# Patient Record
Sex: Male | Born: 1935 | Race: White | Hispanic: No | Smoking: Never smoker
Health system: Southern US, Community
[De-identification: ages and names within clinical notes are randomized; demographics above are authoritative.]

## PROBLEM LIST (undated history)

## (undated) DIAGNOSIS — G629 Polyneuropathy, unspecified: Secondary | ICD-10-CM

## (undated) DIAGNOSIS — F1011 Alcohol abuse, in remission: Secondary | ICD-10-CM

## (undated) DIAGNOSIS — Z923 Personal history of irradiation: Secondary | ICD-10-CM

## (undated) DIAGNOSIS — IMO0002 Reserved for concepts with insufficient information to code with codable children: Secondary | ICD-10-CM

## (undated) DIAGNOSIS — M899 Disorder of bone, unspecified: Secondary | ICD-10-CM

## (undated) DIAGNOSIS — C9000 Multiple myeloma not having achieved remission: Secondary | ICD-10-CM

## (undated) DIAGNOSIS — IMO0001 Reserved for inherently not codable concepts without codable children: Secondary | ICD-10-CM

## (undated) DIAGNOSIS — K219 Gastro-esophageal reflux disease without esophagitis: Secondary | ICD-10-CM

## (undated) DIAGNOSIS — K5720 Diverticulitis of large intestine with perforation and abscess without bleeding: Secondary | ICD-10-CM

## (undated) DIAGNOSIS — K651 Peritoneal abscess: Secondary | ICD-10-CM

## (undated) DIAGNOSIS — S62102A Fracture of unspecified carpal bone, left wrist, initial encounter for closed fracture: Secondary | ICD-10-CM

## (undated) DIAGNOSIS — Z9221 Personal history of antineoplastic chemotherapy: Secondary | ICD-10-CM

## (undated) DIAGNOSIS — N400 Enlarged prostate without lower urinary tract symptoms: Secondary | ICD-10-CM

## (undated) DIAGNOSIS — K5792 Diverticulitis of intestine, part unspecified, without perforation or abscess without bleeding: Secondary | ICD-10-CM

## (undated) HISTORY — DX: Reserved for inherently not codable concepts without codable children: IMO0001

## (undated) HISTORY — DX: Diverticulitis of intestine, part unspecified, without perforation or abscess without bleeding: K57.92

## (undated) HISTORY — DX: Polyneuropathy, unspecified: G62.9

## (undated) HISTORY — DX: Alcohol abuse, in remission: F10.11

## (undated) HISTORY — DX: Personal history of irradiation: Z92.3

## (undated) HISTORY — DX: Benign prostatic hyperplasia without lower urinary tract symptoms: N40.0

## (undated) HISTORY — DX: Reserved for concepts with insufficient information to code with codable children: IMO0002

## (undated) HISTORY — DX: Gastro-esophageal reflux disease without esophagitis: K21.9

---

## 1957-02-03 HISTORY — PX: CYST EXCISION: SHX5701

## 2001-12-04 ENCOUNTER — Encounter: Payer: Self-pay | Admitting: Emergency Medicine

## 2001-12-04 ENCOUNTER — Emergency Department (HOSPITAL_COMMUNITY): Admission: EM | Admit: 2001-12-04 | Discharge: 2001-12-04 | Payer: Self-pay | Admitting: Emergency Medicine

## 2002-09-13 ENCOUNTER — Ambulatory Visit (HOSPITAL_COMMUNITY): Admission: RE | Admit: 2002-09-13 | Discharge: 2002-09-13 | Payer: Self-pay | Admitting: Ophthalmology

## 2005-06-24 ENCOUNTER — Ambulatory Visit (HOSPITAL_COMMUNITY): Admission: RE | Admit: 2005-06-24 | Discharge: 2005-06-24 | Payer: Self-pay | Admitting: Ophthalmology

## 2010-05-14 ENCOUNTER — Ambulatory Visit (HOSPITAL_COMMUNITY)
Admission: RE | Admit: 2010-05-14 | Discharge: 2010-05-14 | Disposition: A | Discharge status: Home / Self Care | Payer: Medicare Other | Source: Ambulatory Visit | Attending: Ophthalmology | Admitting: Ophthalmology

## 2010-05-14 DIAGNOSIS — H26499 Other secondary cataract, unspecified eye: Secondary | ICD-10-CM | POA: Insufficient documentation

## 2011-05-29 DIAGNOSIS — F411 Generalized anxiety disorder: Secondary | ICD-10-CM | POA: Diagnosis not present

## 2011-05-29 DIAGNOSIS — E782 Mixed hyperlipidemia: Secondary | ICD-10-CM | POA: Diagnosis not present

## 2011-05-29 DIAGNOSIS — E119 Type 2 diabetes mellitus without complications: Secondary | ICD-10-CM | POA: Diagnosis not present

## 2011-05-29 DIAGNOSIS — I10 Essential (primary) hypertension: Secondary | ICD-10-CM | POA: Diagnosis not present

## 2011-05-29 DIAGNOSIS — M109 Gout, unspecified: Secondary | ICD-10-CM | POA: Diagnosis not present

## 2011-05-29 DIAGNOSIS — Z125 Encounter for screening for malignant neoplasm of prostate: Secondary | ICD-10-CM | POA: Diagnosis not present

## 2011-06-05 DIAGNOSIS — F411 Generalized anxiety disorder: Secondary | ICD-10-CM | POA: Diagnosis not present

## 2011-07-16 DIAGNOSIS — G609 Hereditary and idiopathic neuropathy, unspecified: Secondary | ICD-10-CM | POA: Diagnosis not present

## 2011-08-13 DIAGNOSIS — G609 Hereditary and idiopathic neuropathy, unspecified: Secondary | ICD-10-CM | POA: Diagnosis not present

## 2011-09-10 DIAGNOSIS — G609 Hereditary and idiopathic neuropathy, unspecified: Secondary | ICD-10-CM | POA: Diagnosis not present

## 2011-11-05 DIAGNOSIS — G609 Hereditary and idiopathic neuropathy, unspecified: Secondary | ICD-10-CM | POA: Diagnosis not present

## 2011-11-27 DIAGNOSIS — G589 Mononeuropathy, unspecified: Secondary | ICD-10-CM | POA: Diagnosis not present

## 2011-11-27 DIAGNOSIS — R209 Unspecified disturbances of skin sensation: Secondary | ICD-10-CM | POA: Diagnosis not present

## 2011-12-23 DIAGNOSIS — H02839 Dermatochalasis of unspecified eye, unspecified eyelid: Secondary | ICD-10-CM | POA: Diagnosis not present

## 2012-01-06 DIAGNOSIS — H02839 Dermatochalasis of unspecified eye, unspecified eyelid: Secondary | ICD-10-CM | POA: Diagnosis not present

## 2012-01-06 DIAGNOSIS — Z95828 Presence of other vascular implants and grafts: Secondary | ICD-10-CM | POA: Diagnosis not present

## 2012-11-01 DIAGNOSIS — G609 Hereditary and idiopathic neuropathy, unspecified: Secondary | ICD-10-CM | POA: Diagnosis not present

## 2012-11-02 ENCOUNTER — Encounter: Payer: Self-pay | Admitting: Family Medicine

## 2012-11-02 ENCOUNTER — Ambulatory Visit (INDEPENDENT_AMBULATORY_CARE_PROVIDER_SITE_OTHER): Payer: Medicare Other | Admitting: Family Medicine

## 2012-11-02 VITALS — BP 130/78 | HR 78 | Temp 97.7°F | Resp 18 | Ht 69.0 in | Wt 275.0 lb

## 2012-11-02 DIAGNOSIS — J019 Acute sinusitis, unspecified: Secondary | ICD-10-CM | POA: Diagnosis not present

## 2012-11-02 DIAGNOSIS — L259 Unspecified contact dermatitis, unspecified cause: Secondary | ICD-10-CM | POA: Diagnosis not present

## 2012-11-02 DIAGNOSIS — R252 Cramp and spasm: Secondary | ICD-10-CM | POA: Diagnosis not present

## 2012-11-02 DIAGNOSIS — K219 Gastro-esophageal reflux disease without esophagitis: Secondary | ICD-10-CM | POA: Insufficient documentation

## 2012-11-02 DIAGNOSIS — L309 Dermatitis, unspecified: Secondary | ICD-10-CM

## 2012-11-02 DIAGNOSIS — N400 Enlarged prostate without lower urinary tract symptoms: Secondary | ICD-10-CM | POA: Insufficient documentation

## 2012-11-02 DIAGNOSIS — G629 Polyneuropathy, unspecified: Secondary | ICD-10-CM | POA: Insufficient documentation

## 2012-11-02 DIAGNOSIS — F1011 Alcohol abuse, in remission: Secondary | ICD-10-CM | POA: Insufficient documentation

## 2012-11-02 LAB — COMPLETE METABOLIC PANEL WITH GFR
ALT: 14 U/L (ref 0–53)
AST: 14 U/L (ref 0–37)
Albumin: 3.6 g/dL (ref 3.5–5.2)
Alkaline Phosphatase: 22 U/L — ABNORMAL LOW (ref 39–117)
BUN: 19 mg/dL (ref 6–23)
CO2: 27 mEq/L (ref 19–32)
Calcium: 8.7 mg/dL (ref 8.4–10.5)
Chloride: 107 mEq/L (ref 96–112)
Creat: 0.8 mg/dL (ref 0.50–1.35)
GFR, Est African American: >89 mL/min
GFR, Est Non African American: 87 mL/min
Glucose, Bld: 95 mg/dL (ref 70–99)
Potassium: 4.6 mEq/L (ref 3.5–5.3)
Sodium: 140 mEq/L (ref 135–145)
Total Bilirubin: 0.5 mg/dL (ref 0.3–1.2)
Total Protein: 6.6 g/dL (ref 6.0–8.3)

## 2012-11-02 MED ORDER — AMOXICILLIN-POT CLAVULANATE 875-125 MG PO TABS: 1.0000 | ORAL_TABLET | Freq: Two times a day (BID) | ORAL | Status: DC

## 2012-11-02 MED ORDER — TRIAMCINOLONE ACETONIDE 0.1 % EX CREA: TOPICAL_CREAM | Freq: Two times a day (BID) | CUTANEOUS | Status: DC

## 2012-11-02 NOTE — Progress Notes (Signed)
Subjective:    Patient ID: Dean [name], male    DOB: [birth year], 77 y.o.   MRN: [id number]  HPI  Patient reports 3 weeks of intermittent pain in his right ear. He denies any itching or burning. He denies any discharge from the auditory canal. He denies any hearing loss or tinnitus. He denies any dizziness. He has been having right maxillary and right frontal sinus pain and pressure with congestion. He also hurts to open and close his jaw at times. He is not he grinds his teeth at night.  He also complains of intermittent cramps in his leg muscles particularly at night. 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 Past Medical History  Diagnosis Date  . Neuropathy   . BPH (benign prostatic hyperplasia)   . GERD (gastroesophageal reflux disease)   . H/O ETOH abuse    No current outpatient prescriptions on file prior to visit.   No current facility-administered medications on file prior to visit.   Allergies  Allergen Reactions  . Codeine Other (See Comments)    Severe eadache   History   Social History  . Marital Status: Married    Spouse Name: N/A    Number of Children: N/A  . Years of Education: N/A   Occupational History  . Not on file.   Social History Main Topics  . Smoking status: Never Smoker   . Smokeless tobacco: Not on file  . Alcohol Use: No  . Drug Use: No  . Sexual Activity: Not on file   Other Topics Concern  . Not on file   Social History Narrative  . No narrative on file     Review of Systems  All other systems reviewed and are negative.       Objective:   Physical Exam  Vitals reviewed. Constitutional: He appears well-developed and well-nourished.  HENT:  Right Ear: External ear normal.  Left Ear: External ear normal.  Nose: Nose normal.  Mouth/Throat: Oropharynx is clear and moist. No  oropharyngeal exudate.  Neck: Neck supple. No thyromegaly present.  Cardiovascular: Normal rate, regular rhythm and normal heart sounds.  Exam reveals no gallop and no friction rub.   No murmur heard. Pulmonary/Chest: Effort normal and breath sounds normal. No respiratory distress. He has no wheezes. He has no rales.  Abdominal: Soft. Bowel sounds are normal.  Lymphadenopathy:    He has no cervical adenopathy.  Skin: Skin is warm. No rash noted. No erythema.          Assessment & Plan:  1. Acute rhinosinusitis Examination of the tympanic membranes and external auditory canals normal. Therefore I believe his ear pain is either eustachian tube dysfunction secondary to sinusitis or possibly TMJ disorder. Treat sinusitis first with Augmentin 875 mg by mouth twice a day for 10 days. If symptoms do not improve I would suggest treatment for TMJ empirically. - amoxicillin-clavulanate (AUGMENTIN) 875-125 MG per tablet; Take 1 tablet by mouth 2 (two) times daily.  Dispense: 20 tablet; Refill: 0  2. Eczema The patient has dyshidrotic eczema. Triamcinolone cream 0.1% applied twice a day as needed I cautioned the patient against daily chronic usage. - triamcinolone cream (KENALOG) 0.1 %; Apply topically 2 (two) times daily.  Dispense: 30 g; Refill: 0  3. Cramps, extremity Check a CMP to rule out hypokalemia  or hypo-calcium. If normal consider chloroquine. - COMPLETE METABOLIC PANEL WITH GFR

## 2012-11-03 ENCOUNTER — Encounter: Payer: Self-pay | Admitting: Family Medicine

## 2012-11-05 ENCOUNTER — Other Ambulatory Visit: Payer: Self-pay | Admitting: Family Medicine

## 2012-11-05 MED ORDER — TAMSULOSIN HCL 0.4 MG PO CAPS: 0.4000 mg | ORAL_CAPSULE | Freq: Every day | ORAL | Status: DC

## 2012-11-29 ENCOUNTER — Encounter (HOSPITAL_COMMUNITY): Payer: Self-pay | Admitting: Emergency Medicine

## 2012-11-29 ENCOUNTER — Emergency Department (HOSPITAL_COMMUNITY): Payer: Medicare Other

## 2012-11-29 ENCOUNTER — Observation Stay (HOSPITAL_COMMUNITY)
Admission: EM | Admit: 2012-11-29 | Discharge: 2012-12-01 | Disposition: A | Discharge status: Home / Self Care | Payer: Medicare Other | Attending: Family Medicine | Admitting: Family Medicine

## 2012-11-29 ENCOUNTER — Other Ambulatory Visit: Payer: Self-pay

## 2012-11-29 DIAGNOSIS — L989 Disorder of the skin and subcutaneous tissue, unspecified: Secondary | ICD-10-CM | POA: Diagnosis not present

## 2012-11-29 DIAGNOSIS — R4701 Aphasia: Secondary | ICD-10-CM | POA: Diagnosis not present

## 2012-11-29 DIAGNOSIS — N400 Enlarged prostate without lower urinary tract symptoms: Secondary | ICD-10-CM

## 2012-11-29 DIAGNOSIS — M899 Disorder of bone, unspecified: Secondary | ICD-10-CM | POA: Diagnosis not present

## 2012-11-29 DIAGNOSIS — I771 Stricture of artery: Secondary | ICD-10-CM | POA: Diagnosis not present

## 2012-11-29 DIAGNOSIS — G9389 Other specified disorders of brain: Secondary | ICD-10-CM | POA: Diagnosis not present

## 2012-11-29 DIAGNOSIS — G459 Transient cerebral ischemic attack, unspecified: Secondary | ICD-10-CM

## 2012-11-29 DIAGNOSIS — R51 Headache: Secondary | ICD-10-CM | POA: Diagnosis not present

## 2012-11-29 DIAGNOSIS — M542 Cervicalgia: Secondary | ICD-10-CM | POA: Diagnosis not present

## 2012-11-29 DIAGNOSIS — I2510 Atherosclerotic heart disease of native coronary artery without angina pectoris: Secondary | ICD-10-CM | POA: Diagnosis not present

## 2012-11-29 DIAGNOSIS — R29898 Other symptoms and signs involving the musculoskeletal system: Secondary | ICD-10-CM | POA: Insufficient documentation

## 2012-11-29 DIAGNOSIS — R29818 Other symptoms and signs involving the nervous system: Secondary | ICD-10-CM | POA: Diagnosis not present

## 2012-11-29 LAB — CBC WITH DIFFERENTIAL/PLATELET
Basophils Absolute: 0 10*3/uL (ref 0.0–0.1)
Basophils Relative: 0 % (ref 0–1)
Eosinophils Absolute: 0.1 10*3/uL (ref 0.0–0.7)
Eosinophils Relative: 1 % (ref 0–5)
HCT: 40.8 % (ref 39.0–52.0)
Hemoglobin: 13.9 g/dL (ref 13.0–17.0)
Lymphocytes Relative: 39 % (ref 12–46)
Lymphs Abs: 3.3 10*3/uL (ref 0.7–4.0)
MCH: 30.2 pg (ref 26.0–34.0)
MCHC: 34.1 g/dL (ref 30.0–36.0)
MCV: 88.5 fL (ref 78.0–100.0)
Monocytes Absolute: 0.7 10*3/uL (ref 0.1–1.0)
Monocytes Relative: 8 % (ref 3–12)
Neutro Abs: 4.5 10*3/uL (ref 1.7–7.7)
Neutrophils Relative %: 53 % (ref 43–77)
Platelets: 152 10*3/uL (ref 150–400)
RBC: 4.61 MIL/uL (ref 4.22–5.81)
RDW: 13.2 % (ref 11.5–15.5)
WBC: 8.5 10*3/uL (ref 4.0–10.5)

## 2012-11-29 LAB — URINALYSIS, ROUTINE W REFLEX MICROSCOPIC
Bilirubin Urine: NEGATIVE
Glucose, UA: NEGATIVE mg/dL
Hgb urine dipstick: NEGATIVE
Ketones, ur: NEGATIVE mg/dL
Leukocytes, UA: NEGATIVE
Nitrite: NEGATIVE
Protein, ur: NEGATIVE mg/dL
Specific Gravity, Urine: 1.01 (ref 1.005–1.030)
Urobilinogen, UA: 0.2 mg/dL (ref 0.0–1.0)
pH: 5.5 (ref 5.0–8.0)

## 2012-11-29 LAB — BASIC METABOLIC PANEL
BUN: 20 mg/dL (ref 6–23)
CO2: 24 mEq/L (ref 19–32)
Calcium: 8.8 mg/dL (ref 8.4–10.5)
Chloride: 108 mEq/L (ref 96–112)
Creatinine, Ser: 0.82 mg/dL (ref 0.50–1.35)
GFR calc Af Amer: >90 mL/min (ref >90)
GFR calc non Af Amer: 84 mL/min — ABNORMAL LOW (ref >90)
Glucose, Bld: 104 mg/dL — ABNORMAL HIGH (ref 70–99)
Potassium: 3.8 mEq/L (ref 3.5–5.1)
Sodium: 141 mEq/L (ref 135–145)

## 2012-11-29 LAB — SEDIMENTATION RATE: Sed Rate: 5 mm/hr (ref 0–16)

## 2012-11-29 MED ORDER — IOHEXOL 300 MG/ML  SOLN
80.0000 mL | Freq: Once | INTRAMUSCULAR | Status: AC | PRN
  Administered 2012-11-29: 80 mL via INTRAVENOUS

## 2012-11-29 MED ORDER — TAMSULOSIN HCL 0.4 MG PO CAPS
0.4000 mg | ORAL_CAPSULE | Freq: Every evening | ORAL | Status: DC
  Administered 2012-11-30 – 2012-12-01 (×2): 0.4 mg via ORAL
  Filled 2012-11-29 (×2): qty 1

## 2012-11-29 MED ORDER — GABAPENTIN 400 MG PO CAPS
400.0000 mg | ORAL_CAPSULE | Freq: Four times a day (QID) | ORAL | Status: DC
  Administered 2012-11-30 (×2): 400 mg via ORAL
  Filled 2012-11-29 (×5): qty 1

## 2012-11-29 MED ORDER — ALPRAZOLAM 1 MG PO TABS
1.0000 mg | ORAL_TABLET | Freq: Two times a day (BID) | ORAL | Status: DC | PRN
  Administered 2012-11-30: 1 mg via ORAL
  Filled 2012-11-29 (×2): qty 1

## 2012-11-29 MED ORDER — ENOXAPARIN SODIUM 40 MG/0.4ML ~~LOC~~ SOLN
40.0000 mg | Freq: Every day | SUBCUTANEOUS | Status: DC
Start: 2012-11-29 — End: 2012-12-01
  Administered 2012-11-30 (×2): 40 mg via SUBCUTANEOUS
  Filled 2012-11-29 (×2): qty 0.4

## 2012-11-29 MED ORDER — ACETAMINOPHEN 325 MG PO TABS
650.0000 mg | ORAL_TABLET | Freq: Once | ORAL | Status: AC
  Administered 2012-11-29: 650 mg via ORAL
  Filled 2012-11-29: qty 2

## 2012-11-29 MED ORDER — ASPIRIN 325 MG PO TABS
325.0000 mg | ORAL_TABLET | Freq: Every day | ORAL | Status: DC
  Administered 2012-11-30 – 2012-12-01 (×2): 325 mg via ORAL
  Filled 2012-11-29 (×2): qty 1

## 2012-11-29 MED ORDER — ASPIRIN 81 MG PO CHEW
324.0000 mg | CHEWABLE_TABLET | Freq: Once | ORAL | Status: AC
  Administered 2012-11-29: 324 mg via ORAL
  Filled 2012-11-29: qty 4

## 2012-11-29 MED ORDER — OXYCODONE-ACETAMINOPHEN 5-325 MG PO TABS
1.0000 | ORAL_TABLET | ORAL | Status: DC | PRN
  Administered 2012-11-29 – 2012-11-30 (×2): 1 via ORAL
  Administered 2012-11-30 – 2012-12-01 (×5): 2 via ORAL
  Administered 2012-12-01: 1 via ORAL
  Filled 2012-11-29 (×2): qty 2
  Filled 2012-11-29: qty 1
  Filled 2012-11-29 (×2): qty 2
  Filled 2012-11-29 (×2): qty 1
  Filled 2012-11-29: qty 2

## 2012-11-29 NOTE — ED Notes (Signed)
Pt states headche x 6 weeks to left side of head along with left neck pain. States his son noticed slurred speech at ~ 1400. Smile is symmetrical. Grasp are moderate and equal bilat. 2 PERRLA. Pronator drift negative. States he is able to walk, but walking makes his head and neck hurt worse.

## 2012-11-29 NOTE — ED Provider Notes (Signed)
CSN: [id number]     Arrival date & time 11/29/12  1733 History   First MD Initiated Contact with Patient 11/29/12 1752    Scribed for Enid Skeens, MD, the patient was seen in room APA15/APA15. This chart was scribed by Lewanda Rife, ED scribe. Patient's care was started at 6:03 PM  Chief Complaint  Patient presents with  . Headache  . Aphasia   (Consider location/radiation/quality/duration/timing/severity/associated sxs/prior Treatment) The history is provided by the patient. No language interpreter was used.   HPI Comments: An Schnabel is a 77 y.o. male who presents to the Emergency Department complaining of waxing and waning unchanged headache radiating to left side neck onset 6 weeks. Reports associated left sided weakness, and expressive aphasia (2:30 pm today). Reports neck pain is exacerbated with ROM to the left and alleviated by nothing. Denies associated numbness, visual disturbances, chest pain, shortness of breath, changes in bowel habits, dysuria, and fever.    Past Medical History  Diagnosis Date  . Neuropathy   . BPH (benign prostatic hyperplasia)   . GERD (gastroesophageal reflux disease)   . H/O ETOH abuse    History reviewed. No pertinent past surgical history. No family history on file. History  Substance Use Topics  . Smoking status: Never Smoker   . Smokeless tobacco: Not on file  . Alcohol Use: Yes     Comment: " Not much no more"    Review of Systems  Constitutional: Negative for fever and chills.  HENT: Positive for voice change.   Eyes: Negative for visual disturbance.  Respiratory: Negative for shortness of breath.   Cardiovascular: Negative for chest pain.  Gastrointestinal: Negative for vomiting and abdominal pain.  Genitourinary: Negative for dysuria and flank pain.  Musculoskeletal: Negative for back pain, neck pain and neck stiffness.  Skin: Negative for rash.  Neurological: Positive for headaches. Negative for light-headedness.    All other systems reviewed and are negative.   A complete 10 system review of systems was obtained and all systems are negative except as noted in the HPI and PMHx.    Allergies  Codeine  Home Medications   Current Outpatient Rx  Name  Route  Sig  Dispense  Refill  . amoxicillin-clavulanate (AUGMENTIN) 875-125 MG per tablet   Oral   Take 1 tablet by mouth 2 (two) times daily.   20 tablet   0   . gabapentin (NEURONTIN) 400 MG capsule   Oral   Take 400 mg by mouth 4 (four) times daily.         Marland Kitchen ibuprofen (ADVIL,MOTRIN) 800 MG tablet   Oral   Take 800 mg by mouth every 6 (six) hours as needed for pain.         . tamsulosin (FLOMAX) 0.4 MG CAPS capsule   Oral   Take 1 capsule (0.4 mg total) by mouth daily.   30 capsule   5   . triamcinolone cream (KENALOG) 0.1 %   Topical   Apply topically 2 (two) times daily.   30 g   0    BP 160/83  Pulse 68  Temp(Src) 97.6 F (36.4 C) (Oral)  Resp 18  Ht 5\' 8"  (1.727 m)  Wt 275 lb (124.739 kg)  BMI 41.82 kg/m2  SpO2 96% Physical Exam  Nursing note and vitals reviewed. Constitutional: He is oriented to person, place, and time. He appears well-developed and well-nourished.  HENT:  Head: Normocephalic and atraumatic.    Eyes: Conjunctivae and EOM  are normal. Pupils are equal, round, and reactive to light. Right eye exhibits no discharge. Left eye exhibits no discharge.  Neck: Normal range of motion. Neck supple. Muscular tenderness present. No spinous process tenderness present. No rigidity. No tracheal deviation present.    Pain with ROM to the left, no pain with ROM to the right   TTP left trapezius extending to the left side of the skull Mild TTP of left temple   Cardiovascular: Normal rate and regular rhythm.   No murmur heard. Pulmonary/Chest: Effort normal and breath sounds normal. No respiratory distress. He has no wheezes. He has no rales.  Abdominal: Soft. He exhibits no distension. There is no  tenderness. There is no guarding.  Musculoskeletal: He exhibits tenderness (left paraspinal cervical and base of skull). He exhibits no edema.       Back:  Neurological: He is alert and oriented to person, place, and time. He has normal strength. No cranial nerve deficit or sensory deficit.  CN II-XII intact, no ataxia on finger to nose, no nystagmus, 5/5 strength throughout, no pronator drift, no Romberg sign, normal gait. Normal finger to nose bilaterally.     Skin: Skin is warm. No rash noted.  Psychiatric: He has a normal mood and affect.    ED Course  Procedures  COORDINATION OF CARE:  Nursing notes reviewed. Vital signs reviewed. Initial pt interview and examination performed.   6:11 PM-Discussed work up plan with pt at bedside, which includes CT of head without contrast, CBC with diff panel, BMP, EKG, sed rate, and stroke swallow screen. Pt agrees with plan. 7:46 PM Studies added include CT angio of head and neck, and CXR 8:58 PM Consulted with Dr. Sharl Ma for admission   9:15 PM Nursing Notes Reviewed/ Care Coordinated Applicable Imaging Reviewed  Interpretation of Laboratory Data incorporated into ED treatment Discussed results and treatment plan with pt. Pt demonstrates understanding and agrees with plan.   Treatment plan initiated: Medications  acetaminophen (TYLENOL) tablet 650 mg (650 mg Oral Given 11/29/12 2030)  iohexol (OMNIPAQUE) 300 MG/ML solution 80 mL (80 mLs Intravenous Contrast Given 11/29/12 1907)  aspirin chewable tablet 324 mg (324 mg Oral Given 11/29/12 2030)     Initial diagnostic testing ordered.    Labs Review Labs Reviewed  BASIC METABOLIC PANEL - Abnormal; Notable for the following:    Glucose, Bld 104 (*)    GFR calc non Af Amer 84 (*)    All other components within normal limits  COMPREHENSIVE METABOLIC PANEL - Abnormal; Notable for the following:    Albumin 3.0 (*)    Alkaline Phosphatase 22 (*)    GFR calc non Af Amer 89 (*)    All other  components within normal limits  GLUCOSE, CAPILLARY - Abnormal; Notable for the following:    Glucose-Capillary 118 (*)    All other components within normal limits  GLUCOSE, CAPILLARY - Abnormal; Notable for the following:    Glucose-Capillary 104 (*)    All other components within normal limits  BASIC METABOLIC PANEL - Abnormal; Notable for the following:    Glucose, Bld 113 (*)    GFR calc non Af Amer 89 (*)    All other components within normal limits  GLUCOSE, CAPILLARY - Abnormal; Notable for the following:    Glucose-Capillary 113 (*)    All other components within normal limits  GLUCOSE, CAPILLARY - Abnormal; Notable for the following:    Glucose-Capillary 108 (*)    All other components within  normal limits  GLUCOSE, CAPILLARY - Abnormal; Notable for the following:    Glucose-Capillary 111 (*)    All other components within normal limits  GLUCOSE, CAPILLARY - Abnormal; Notable for the following:    Glucose-Capillary 109 (*)    All other components within normal limits  CBC WITH DIFFERENTIAL  SEDIMENTATION RATE  URINALYSIS, ROUTINE W REFLEX MICROSCOPIC  HEMOGLOBIN A1C  LIPID PANEL  URINE RAPID DRUG SCREEN (HOSP PERFORMED)  CBC  PROTEIN ELECTROPHORESIS, SERUM  ANA  PSA  C-REACTIVE PROTEIN  MULTIPLE MYELOMA PANEL, SERUM  BETA 2 MICROGLOBULINE, SERUM  IMMUNOFIXATION ELECTROPHORESIS, URINE (WITH TOT PROT)  IMMUNOFIXATION, URINE   Imaging Review Ct Angio Head W/cm &/or Wo Cm  11/29/2012   CLINICAL DATA:  Left-sided neck pain for weeks; no documented weakness on exam.  EXAM: CT ANGIOGRAPHY HEAD AND NECK  TECHNIQUE: Multidetector CT imaging of the head and neck was performed using the standard protocol during bolus administration of intravenous contrast. Multiplanar CT image reconstructions including MIPs were obtained to evaluate the vascular anatomy. Carotid stenosis measurements (when applicable) are obtained utilizing NASCET criteria, using the distal internal carotid  diameter as the denominator.  CONTRAST:  80mL OMNIPAQUE IOHEXOL 300 MG/ML  SOLN  COMPARISON:  CT head earlier in the day  FINDINGS: CTA HEAD FINDINGS  Non stenotic atheromatous calcification of the cavernous carotids. This extends to the supraclinoid ICA on the right. No flow-limiting stenosis is evident.  The basilar artery is patent with both vertebrals contributing, right dominant. No flow-limiting stenosis or dissection.  There is no significant stenosis or occlusion of the anterior, middle, or posterior cerebral arteries. Major dural venous sinuses are patent. There is no intracranial aneurysm.  Mild atrophy with chronic microvascular ischemic change. A destructive lesion of the left occipital condyle is re demonstrated. Cross-sectional measurements are approximately 22 x 16 x 14 mm. Slight intracranial extension medially without compression of the brainstem or cerebellum. No encroachment on the left internal carotid artery, vertebral artery, or internal jugular vein at this time. The lesion does not demonstrate avid contrast enhancement.  5 mm anterior 3rd ventricle lesion seen on noncontrast CT not clearly confirmed on this exam. Consider elective MRI brain without and with contrast for further evaluation.  No acute sinus or mastoid disease. Negative orbits. Hypertrophic change left mandibular condyle may represent an old fracture with callus.  Review of the MIP images confirms the above findings.  CTA NECK FINDINGS  Unremarkable transverse arch. Conventional anatomy of the great vessel origins without proximal stenosis. No lung apex lesion. No obvious neck masses. No flow reducing atheromatous change at the carotid bifurcations. No carotid dissection. Both vertebrals patent with the right vertebral dominant. No neck masses or visible adenopathy. Mild cervical spondylosis.  Review of the MIP images confirms the above findings.  IMPRESSION: CTA HEAD IMPRESSION  22 x 16 x 14 mm destructive lesion left occipital  condyle without encroachment on the left internal carotid, left vertebral, or left internal jugular. The lesion appears insufficiently vascular and too inferior to strongly consider glomus tumor. Metastatic lesion would be the most likely consideration. Chordoma, schwannoma, or myeloma are less likely considerations.  The 5 mm hyperdense lesion seen on noncontrast CT is less evident on CTA. Significance is unclear.  No intracranial flow limiting stenosis or occlusion.  CTA NECK IMPRESSION  No extracranial stenosis or occlusion is evident. No regional adenopathy is obvious.   Electronically Signed   By: Davonna Belling M.D.   On: 11/29/2012 19:52  Dg Chest 2 View  11/29/2012   CLINICAL DATA:  Left-sided headache and neck pain  EXAM: CHEST  2 VIEW  COMPARISON:  None.  FINDINGS: The heart size and mediastinal contours are within normal limits. Both lungs are clear. The visualized skeletal structures are unremarkable. There is scoliosis of spine.  IMPRESSION: No active cardiopulmonary disease.   Electronically Signed   By: Sherian Rein M.D.   On: 11/29/2012 19:44   Ct Head Wo Contrast  11/29/2012   CLINICAL DATA:  Headache. Acute onset of aphasia.  EXAM: CT HEAD WITHOUT CONTRAST  TECHNIQUE: Contiguous axial images were obtained from the base of the skull through the vertex without intravenous contrast.  COMPARISON:  None.  FINDINGS: There is a 2.5 cm destructive lesion of the left occipital condyle immediately adjacent to the left internal carotid artery and jugular foramen.  There is no acute intracranial hemorrhage or acute infarct. There is mild diffuse atrophy. 5 mm colloid cyst in the anterior aspect of the 3rd ventricle. No ventricular dilatation.  There is a deformity of the left mandibular angle which may be due to an osteoma or an old fracture.  IMPRESSION: 1. Destructive lesion of the skullbase involving the left occipital condyle and carotid and jugular foramina on the left. 2. No other acute  abnormality. 3. 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   Electronically Signed   By: Geanie Cooley M.D.   On: 11/29/2012 18:52   Ct Angio Neck W/cm &/or Wo/cm  11/29/2012   CLINICAL DATA:  Left-sided neck pain for weeks; no documented weakness on exam.  EXAM: CT ANGIOGRAPHY HEAD AND NECK  TECHNIQUE: Multidetector CT imaging of the head and neck was performed using the standard protocol during bolus administration of intravenous contrast. Multiplanar CT image reconstructions including MIPs were obtained to evaluate the vascular anatomy. Carotid stenosis measurements (when applicable) are obtained utilizing NASCET criteria, using the distal internal carotid diameter as the denominator.  CONTRAST:  80mL OMNIPAQUE IOHEXOL 300 MG/ML  SOLN  COMPARISON:  CT head earlier in the day  FINDINGS: CTA HEAD FINDINGS  Non stenotic atheromatous calcification of the cavernous carotids. This extends to the supraclinoid ICA on the right. No flow-limiting stenosis is evident.  The basilar artery is patent with both vertebrals contributing, right dominant. No flow-limiting stenosis or dissection.  There is no significant stenosis or occlusion of the anterior, middle, or posterior cerebral arteries. Major dural venous sinuses are patent. There is no intracranial aneurysm.  Mild atrophy with chronic microvascular ischemic change. A destructive lesion of the left occipital condyle is re demonstrated. Cross-sectional measurements are approximately 22 x 16 x 14 mm. Slight intracranial extension medially without compression of the brainstem or cerebellum. No encroachment on the left internal carotid artery, vertebral artery, or internal jugular vein at this time. The lesion does not demonstrate avid contrast enhancement.  5 mm anterior 3rd ventricle lesion seen on noncontrast CT not clearly confirmed on this exam. Consider elective MRI brain without and with contrast for further evaluation.  No acute sinus or mastoid disease. Negative  orbits. Hypertrophic change left mandibular condyle may represent an old fracture with callus.  Review of the MIP images confirms the above findings.  CTA NECK FINDINGS  Unremarkable transverse arch. Conventional anatomy of the great vessel origins without proximal stenosis. No lung apex lesion. No obvious neck masses. No flow reducing atheromatous change at the carotid bifurcations. No carotid dissection. Both vertebrals patent with the right vertebral dominant. No neck masses  or visible adenopathy. Mild cervical spondylosis.  Review of the MIP images confirms the above findings.  IMPRESSION: CTA HEAD IMPRESSION  22 x 16 x 14 mm destructive lesion left occipital condyle without encroachment on the left internal carotid, left vertebral, or left internal jugular. The lesion appears insufficiently vascular and too inferior to strongly consider glomus tumor. Metastatic lesion would be the most likely consideration. Chordoma, schwannoma, or myeloma are less likely considerations.  The 5 mm hyperdense lesion seen on noncontrast CT is less evident on CTA. Significance is unclear.  No intracranial flow limiting stenosis or occlusion.  CTA NECK IMPRESSION  No extracranial stenosis or occlusion is evident. No regional adenopathy is obvious.   Electronically Signed   By: Davonna Belling M.D.   On: 11/29/2012 19:52    EKG Interpretation     Ventricular Rate:    PR Interval:    QRS Duration:   QT Interval:    QTC Calculation:   R Axis:     Text Interpretation:              MDM  No diagnosis found. I personally performed the services described in this documentation, which was scribed in my presence. The recorded information has been reviewed and is accurate.  HA new for pt, normal neuro exam. CT shows destructive bone lesion, concern for malignancy.  Discussed with hospitalist and pt.  Initially pt refused admission however family convinced him to stay for further eval and to arrange Close outpt  management. Pain improved in ED.   The patients results and plan were reviewed and discussed.   Any x-rays performed were personally reviewed by myself.   Differential diagnosis were considered with the presenting HPI.  Diagnosis: Headache, Brain lesion  Admission/ observation were discussed with the admitting physician, patient and/or family and they are comfortable with the plan.     Enid Skeens, MD 12/01/12 [phone]

## 2012-11-30 ENCOUNTER — Observation Stay (HOSPITAL_COMMUNITY): Payer: Medicare Other

## 2012-11-30 DIAGNOSIS — N200 Calculus of kidney: Secondary | ICD-10-CM | POA: Diagnosis not present

## 2012-11-30 DIAGNOSIS — M899 Disorder of bone, unspecified: Secondary | ICD-10-CM | POA: Diagnosis not present

## 2012-11-30 DIAGNOSIS — R51 Headache: Secondary | ICD-10-CM | POA: Diagnosis not present

## 2012-11-30 DIAGNOSIS — R220 Localized swelling, mass and lump, head: Secondary | ICD-10-CM | POA: Diagnosis not present

## 2012-11-30 DIAGNOSIS — J984 Other disorders of lung: Secondary | ICD-10-CM | POA: Diagnosis not present

## 2012-11-30 DIAGNOSIS — R471 Dysarthria and anarthria: Secondary | ICD-10-CM | POA: Diagnosis not present

## 2012-11-30 DIAGNOSIS — G459 Transient cerebral ischemic attack, unspecified: Secondary | ICD-10-CM | POA: Diagnosis not present

## 2012-11-30 DIAGNOSIS — R29818 Other symptoms and signs involving the nervous system: Secondary | ICD-10-CM | POA: Diagnosis not present

## 2012-11-30 DIAGNOSIS — N400 Enlarged prostate without lower urinary tract symptoms: Secondary | ICD-10-CM | POA: Diagnosis not present

## 2012-11-30 DIAGNOSIS — I658 Occlusion and stenosis of other precerebral arteries: Secondary | ICD-10-CM | POA: Diagnosis not present

## 2012-11-30 DIAGNOSIS — I369 Nonrheumatic tricuspid valve disorder, unspecified: Secondary | ICD-10-CM | POA: Diagnosis not present

## 2012-11-30 DIAGNOSIS — K8020 Calculus of gallbladder without cholecystitis without obstruction: Secondary | ICD-10-CM | POA: Diagnosis not present

## 2012-11-30 LAB — GLUCOSE, CAPILLARY
Glucose-Capillary: 118 mg/dL — ABNORMAL HIGH (ref 70–99)
Glucose-Capillary: 104 mg/dL — ABNORMAL HIGH (ref 70–99)
Glucose-Capillary: 113 mg/dL — ABNORMAL HIGH (ref 70–99)
Glucose-Capillary: 108 mg/dL — ABNORMAL HIGH (ref 70–99)

## 2012-11-30 LAB — COMPREHENSIVE METABOLIC PANEL
ALT: 12 U/L (ref 0–53)
AST: 13 U/L (ref 0–37)
Albumin: 3 g/dL — ABNORMAL LOW (ref 3.5–5.2)
Alkaline Phosphatase: 22 U/L — ABNORMAL LOW (ref 39–117)
BUN: 17 mg/dL (ref 6–23)
CO2: 26 mEq/L (ref 19–32)
Calcium: 8.5 mg/dL (ref 8.4–10.5)
Chloride: 108 mEq/L (ref 96–112)
Creatinine, Ser: 0.73 mg/dL (ref 0.50–1.35)
GFR calc Af Amer: >90 mL/min (ref >90)
GFR calc non Af Amer: 89 mL/min — ABNORMAL LOW (ref >90)
Glucose, Bld: 97 mg/dL (ref 70–99)
Potassium: 3.6 mEq/L (ref 3.5–5.1)
Sodium: 142 mEq/L (ref 135–145)
Total Bilirubin: 0.4 mg/dL (ref 0.3–1.2)
Total Protein: 6.1 g/dL (ref 6.0–8.3)

## 2012-11-30 LAB — RAPID URINE DRUG SCREEN, HOSP PERFORMED
Amphetamines: NOT DETECTED
Barbiturates: NOT DETECTED
Benzodiazepines: NOT DETECTED
Cocaine: NOT DETECTED
Opiates: NOT DETECTED
Tetrahydrocannabinol: NOT DETECTED

## 2012-11-30 LAB — LIPID PANEL
Cholesterol: 152 mg/dL (ref 0–200)
HDL: 40 mg/dL (ref >39)
LDL Cholesterol: 92 mg/dL (ref 0–99)
Total CHOL/HDL Ratio: 3.8 RATIO
Triglycerides: 100 mg/dL (ref <150)
VLDL: 20 mg/dL (ref 0–40)

## 2012-11-30 LAB — HEMOGLOBIN A1C
Hgb A1c MFr Bld: 5.6 % (ref <5.7)
Mean Plasma Glucose: 114 mg/dL (ref <117)

## 2012-11-30 MED ORDER — IOHEXOL 300 MG/ML  SOLN
100.0000 mL | Freq: Once | INTRAMUSCULAR | Status: AC | PRN
  Administered 2012-11-30: 100 mL via INTRAVENOUS

## 2012-11-30 MED ORDER — MORPHINE SULFATE 2 MG/ML IJ SOLN: 2.0000 mg | INTRAMUSCULAR | Status: DC | PRN

## 2012-11-30 MED ORDER — IOHEXOL 300 MG/ML  SOLN
50.0000 mL | Freq: Once | INTRAMUSCULAR | Status: AC | PRN
  Administered 2012-11-30: 50 mL via ORAL

## 2012-11-30 NOTE — Progress Notes (Signed)
UR Chart Review Completed  

## 2012-11-30 NOTE — Progress Notes (Signed)
*  PRELIMINARY RESULTS* Echocardiogram 2D Echocardiogram has been performed.  Dean [name] 11/30/2012, 10:57 AM

## 2012-12-01 ENCOUNTER — Observation Stay (HOSPITAL_COMMUNITY): Payer: Medicare Other

## 2012-12-01 DIAGNOSIS — C9000 Multiple myeloma not having achieved remission: Secondary | ICD-10-CM | POA: Diagnosis not present

## 2012-12-01 DIAGNOSIS — M899 Disorder of bone, unspecified: Secondary | ICD-10-CM | POA: Diagnosis not present

## 2012-12-01 DIAGNOSIS — M5030 Other cervical disc degeneration, unspecified cervical region: Secondary | ICD-10-CM | POA: Diagnosis not present

## 2012-12-01 DIAGNOSIS — R220 Localized swelling, mass and lump, head: Secondary | ICD-10-CM

## 2012-12-01 LAB — CBC
HCT: 41 % (ref 39.0–52.0)
Hemoglobin: 13.9 g/dL (ref 13.0–17.0)
MCH: 30.1 pg (ref 26.0–34.0)
MCHC: 33.9 g/dL (ref 30.0–36.0)
MCV: 88.7 fL (ref 78.0–100.0)
Platelets: 154 10*3/uL (ref 150–400)
RBC: 4.62 MIL/uL (ref 4.22–5.81)
RDW: 13 % (ref 11.5–15.5)
WBC: 9.6 10*3/uL (ref 4.0–10.5)

## 2012-12-01 LAB — BASIC METABOLIC PANEL
BUN: 16 mg/dL (ref 6–23)
CO2: 26 mEq/L (ref 19–32)
Calcium: 8.4 mg/dL (ref 8.4–10.5)
Chloride: 103 mEq/L (ref 96–112)
Creatinine, Ser: 0.73 mg/dL (ref 0.50–1.35)
GFR calc Af Amer: >90 mL/min (ref >90)
GFR calc non Af Amer: 89 mL/min — ABNORMAL LOW (ref >90)
Glucose, Bld: 113 mg/dL — ABNORMAL HIGH (ref 70–99)
Potassium: 3.5 mEq/L (ref 3.5–5.1)
Sodium: 138 mEq/L (ref 135–145)

## 2012-12-01 LAB — GLUCOSE, CAPILLARY
Glucose-Capillary: 111 mg/dL — ABNORMAL HIGH (ref 70–99)
Glucose-Capillary: 109 mg/dL — ABNORMAL HIGH (ref 70–99)

## 2012-12-01 LAB — C-REACTIVE PROTEIN: CRP: 1.4 mg/dL — ABNORMAL HIGH (ref <0.60)

## 2012-12-01 MED ORDER — OXYCODONE-ACETAMINOPHEN 5-325 MG PO TABS: 1.0000 | ORAL_TABLET | ORAL | Status: DC | PRN

## 2012-12-01 MED ORDER — METHOCARBAMOL 500 MG PO TABS
500.0000 mg | ORAL_TABLET | Freq: Four times a day (QID) | ORAL | Status: DC | PRN
  Administered 2012-12-01 (×2): 500 mg via ORAL
  Filled 2012-12-01 (×2): qty 1

## 2012-12-01 MED ORDER — METHOCARBAMOL 500 MG PO TABS: 500.0000 mg | ORAL_TABLET | Freq: Four times a day (QID) | ORAL | Status: DC | PRN

## 2012-12-01 NOTE — Progress Notes (Signed)
[facility] PROGRESS NOTE  Dean [name] WUJ:[id number] DOB: 05/20/1935 DOA: 11/29/2012 PCP: Leo Grosser, MD  Assessment/Plan:  1. 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 Consider PET scan as outpatient. CT CHEST, ABDOMEN AND PELVIS s negative for metastatic work-up. Etiology unknown but the differential is wide including metastatic disease versus primary lesion versus hemangioma versus occipital condyle syndrome. PSA, ANA, CRP, SPEP with IFE, B2M, UPEP with IFE, skeletal survey. Most likely malignancies to present this way would include prostate cancer, small cell lung cancer, myeloma versus inflammatory diseases. Radiotherapy to be considered.  2. Transient aphasia, left-sided weakness, resolved on admission: Felt to be secondary to skull base lesion. Resolved. No recurrence. Stroke evaluation negative. 3. Neuropathy: Unknown etiology. Continue gabapentin. 4. GERD: Stable. 5. Torturous thoracic aorta. 6. CAD seen on CT: Incidental finding. Followup as an outpatient.  Discussed with Dr. Wynetta Emery, neurosurgery. He has reviewed the films. He recommends a soft neck collar, activity as tolerated, followup with him in one week. He recommended CT guided biopsy of possible, discussed with Dr. Luretha Rued is not felt to be a candidate for percutaneous biopsy. Also recommended cervical flexion and extension films which are pending at this time. Discussed with Dr. Zigmund Shireen Rayburn who concurs with the above.  Neurosurgery consult as outpatient  Robaxin for neck strain  Followup with [facility] in one week post discharge  Home with cervical collar   Pending studies:  ANA  PSA  CRP  Multiple myeloma panel  24 hour urine  UPEP  Skeletal survey  Code Status: DNR  DVT prophylaxis: Lovenox  Family Communication: Discussed in detail with wife at bedside  Disposition Plan: Home   Brendia Sacks, MD  [facility]  Pager [phone] If 7PM-7AM, please contact night-coverage at [email or web address], password [facility] 12/01/2012, 2:45 PM  LOS: 2 days   Summary: 77 year old man presented to the emergency department with complaint of left-sided headache present for 6 weeks, associated with radiation to the left side of the neck. Noted to have an episode of a seizure with left-sided weakness which lasted for a few minutes. The time of admission patient had recovered neurologically but neck and headache pain persisted. CT of the head revealed a 2.5 mm destructive lesion in the occipital condyle. Patient was admitted for further evaluation of this as well as TIA evaluation.   Please note at current time EDP note, H&P 10/27 and progress note 10/28 are incomplete.   Consultants:  Neurology  Oncology Procedures:  2-D echocardiogram: Left ventricular ejection fraction 55-60%. Grade 2 diastolic dysfunction. Moderate to severe left atrial enlargement.   HPI/Subjective: Feels much better after Robaxin. Decreased neck pain. No paresthesias or weakness. Wants to go home.   Objective: Filed Vitals:   12/01/12 0131 12/01/12 0527 12/01/12 0927 12/01/12 1327  BP: 135/65 143/65 131/80 131/74  Pulse: 66 64 62 68  Temp: 98.1 F (36.7 C) 98.1 F (36.7 C) 98.1 F (36.7 C) 97.9 F (36.6 C)  TempSrc: Oral Oral Oral Oral  Resp: 20 20 20 20   Height:      Weight:      SpO2: 95% 93% 95% 95%    Intake/Output Summary (Last 24 hours) at 12/01/12 1445 Last data filed at 12/01/12 1225  Gross per 24 hour  Intake    840 ml  Output    550 ml  Net    290 ml     Filed Weights   11/29/12 1746  Weight:  124.739 kg (275 lb)    Exam: Afebrile, vital signs stable.  General: Appears calm and comfortable. Speech fluent and clear.  Neck: Some muscular tenderness posterior neck radiating up to skull base, ROM somewhat limited secondary to pain  CV: RRR, no m/r/g, no LE edema  Respiratory: CTA bilaterally, no w/r/r.  Normal respiratory effort  Musk: grossly normal tone and strength BUE/BLE  Psychiatric: Grossly normal mood and affect. Speech fluent and clear.  Neurologic: Cranial nerves 2-12 intact. No pronator drift.  Data Reviewed:  Capillary blood sugars stable   Basic metabolic panel unremarkable   CBC within normal limits   LDL 92 urine normal triglycerides.   ESR 5   Hemoglobin A1c 5.6   CT head, MRI brain, MRA head reviewed.   CT chest, no evidence of malignancy. Advanced coronary atherosclerosis.   CT of the abdomen and pelvis: No primary malignancy noted.   Scheduled Meds: . aspirin  325 mg Oral Daily  . enoxaparin (LOVENOX) injection  40 mg Subcutaneous QHS  . gabapentin  400 mg Oral QID  . tamsulosin  0.4 mg Oral QPM   Continuous Infusions:   Active Problems:   TIA (transient ischemic attack)   Skull lesion

## 2012-12-01 NOTE — Consult Note (Signed)
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  He has a past medical history significant for neuropathy (unknown etiology), BPH, GERD, and H/O EtOH abuse.   Dean [name] reports that he presented to the ED with a headache that has been ongoing for about 6 weeks.  He reports that it is dull, achey, and throbbing in nature.  He reports that on Thursday and Friday, he was under a house trailer welding.  He reports that he got a "crick in the neck" on the left.  He treated this symptomatically and then on Monday, he was crawling out from under the trailer and thought he "pulled a muscle."  He then became weak on his left side and then he presented to the ED.  He was noted that his speech was slurred as well.   Other than left neck pain and headache, this gentleman denies any complaints. He reports that he takes Gabapentin at home for neuropathy of unknown etiology.  He denies any B symptoms including fevers, chills, night sweats, but he does not an intentional weight loss of 30 lbs over 8 months. He denies any blood in stool, black tarry stool, difficulty urinating, hematuria, abdominal pain, and chest pain.   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  His PCP is Dr. Tanya Nones.  He denies any recent EtOH abuse and denies any tobacco abuse.   I personally reviewed and went over laboratory results with the patient.  Labs are unremarkable except for a >20 WGN:FAOZHYQMVH ratio indicative of mild dehydration.   I personally reviewed and went over radiographic studies with the patient.   The MRI shows a left skullbase mass measures 14 x 22 mm in extends superiorly to the level of the hypoglossal canal.  Subsequent CT CAP was unremarkable from a Hem/Onc standpoint.   We will performed some lab work today to evaluate for a few etiologies, but neurology/neurosurgery will need to see the patient for etiology of this lesion.  PAST MEDICAL HISTORY:   Past Medical History  Diagnosis Date  . Neuropathy   . BPH (benign prostatic hyperplasia)   . GERD (gastroesophageal reflux disease)   . H/O ETOH abuse     ALLERGIES: Allergies  Allergen Reactions  . Codeine Other (See Comments)    Severe eadache      MEDICATIONS: I have reviewed the patient's current medications.     PAST SURGICAL HISTORY History reviewed. No pertinent past surgical history.  FAMILY HISTORY: He has 32 son 36 years old who is alive and well and does "nothing" for a living.  He has 2 daughters, 13 yo, and 86 yo who are both alive and well.  He is presently married to his second wife and they have been married for 21 years.  Her name is Corrie Dandy.  SOCIAL HISTORY: He denies any tobacco abuse.  He does admit to a history of EtOH abuse drinking 1/2 gallon of homemade liquor or apple brandy quitting about 10  years ago.  He used to work as a Naval architect but he is retired from this.  He now is a Teaching laboratory technician."   PERFORMANCE STATUS: The patient's performance status is 2 - Symptomatic, <50% confined to bed  PHYSICAL EXAM: Most Recent Vital Signs: Blood pressure 143/65, pulse 64, temperature 98.1 F (36.7 C), temperature source Oral, resp. rate 20, height 5\' 8"  (1.727 m), weight 275 lb (124.739 kg), SpO2 93.00%. General appearance: alert, cooperative, appears stated age and mild distress Head: Normocephalic, without obvious abnormality, atraumatic Eyes: negative findings: lids and lashes normal, conjunctivae and sclerae normal, corneas clear and pupils equal, round, reactive to light and accomodation Lungs:  clear to auscultation bilaterally Heart: regular rate and rhythm, S1, S2 normal, no murmur, click, rub or gallop Abdomen: soft, non-tender; bowel sounds normal; no masses,  no organomegaly and obese Extremities: extremities normal, atraumatic, no cyanosis or edema and healing skin lesions on LE Skin: Skin color, texture, turgor normal. No rashes or lesions Neurologic: Grossly normal. UE strength is 5/5 B/L and equal.  Mini-mental status exam is accurate and precise.  LABORATORY DATA:  Results for orders placed during the hospital encounter of 11/29/12 (from the past 48 hour(s))  CBC WITH DIFFERENTIAL     Status: None   Collection Time    11/29/12  5:56 PM      Result Value Range   WBC 8.5  4.0 - 10.5 K/uL   RBC 4.61  4.22 - 5.81 MIL/uL   Hemoglobin 13.9  13.0 - 17.0 g/dL   HCT 81.1  91.4 - 78.2 %   MCV 88.5  78.0 - 100.0 fL   MCH 30.2  26.0 - 34.0 pg   MCHC 34.1  30.0 - 36.0 g/dL   RDW 95.6  21.3 - 08.6 %   Platelets 152  150 - 400 K/uL   Neutrophils Relative % 53  43 - 77 %   Neutro Abs 4.5  1.7 - 7.7 K/uL   Lymphocytes Relative 39  12 - 46 %   Lymphs Abs 3.3  0.7 - 4.0 K/uL   Monocytes Relative 8  3 - 12 %   Monocytes Absolute 0.7  0.1 - 1.0 K/uL   Eosinophils Relative 1  0 - 5 %   Eosinophils Absolute 0.1  0.0 - 0.7 K/uL   Basophils Relative 0  0 - 1 %   Basophils Absolute 0.0  0.0 - 0.1 K/uL  BASIC METABOLIC PANEL     Status: Abnormal   Collection Time    11/29/12  5:56 PM      Result Value Range   Sodium 141  135 - 145 mEq/L   Potassium 3.8  3.5 - 5.1 mEq/L   Chloride 108  96 - 112 mEq/L   CO2 24  19 - 32 mEq/L   Glucose, Bld 104 (*) 70 - 99 mg/dL   BUN 20  6 - 23 mg/dL   Creatinine, Ser 5.78  0.50 - 1.35 mg/dL   Calcium 8.8  8.4 - 46.9 mg/dL   GFR calc non Af Amer 84 (*) >90 mL/min   GFR calc Af Amer >90  >90 mL/min   Comment: (NOTE)     The eGFR has been calculated using the CKD EPI equation.     This calculation has not been validated in all clinical  situations.     eGFR's persistently <90 mL/min signify possible Chronic Kidney     Disease.  SEDIMENTATION RATE     Status: None  Collection Time    11/29/12  6:16 PM      Result Value Range   Sed Rate 5  0 - 16 mm/hr  URINALYSIS, ROUTINE W REFLEX MICROSCOPIC     Status: None   Collection Time    11/29/12  8:05 PM      Result Value Range   Color, Urine YELLOW  YELLOW   APPearance CLEAR  CLEAR   Specific Gravity, Urine 1.010  1.005 - 1.030   pH 5.5  5.0 - 8.0   Glucose, UA NEGATIVE  NEGATIVE mg/dL   Hgb urine dipstick NEGATIVE  NEGATIVE   Bilirubin Urine NEGATIVE  NEGATIVE   Ketones, ur NEGATIVE  NEGATIVE mg/dL   Protein, ur NEGATIVE  NEGATIVE mg/dL   Urobilinogen, UA 0.2  0.0 - 1.0 mg/dL   Nitrite NEGATIVE  NEGATIVE   Leukocytes, UA NEGATIVE  NEGATIVE   Comment: MICROSCOPIC NOT DONE ON URINES WITH NEGATIVE PROTEIN, BLOOD, LEUKOCYTES, NITRITE, OR GLUCOSE <1000 mg/dL.  HEMOGLOBIN A1C     Status: None   Collection Time    11/29/12 11:08 PM      Result Value Range   Hemoglobin A1C 5.6  <5.7 %   Comment: (NOTE)                                                                               According to the ADA Clinical Practice Recommendations for 2011, when     HbA1c is used as a screening test:      >=6.5%   Diagnostic of Diabetes Mellitus               (if abnormal result is confirmed)     5.7-6.4%   Increased risk of developing Diabetes Mellitus     References:Diagnosis and Classification of Diabetes Mellitus,Diabetes     Care,2011,34(Suppl 1):S62-S69 and Standards of Medical Care in             Diabetes - 2011,Diabetes Care,2011,34 (Suppl 1):S11-S61.   Mean Plasma Glucose 114  <117 mg/dL   Comment: Performed at [facility]  LIPID PANEL     Status: None   Collection Time    11/30/12  5:30 AM      Result Value Range   Cholesterol 152  0 - 200 mg/dL   Triglycerides 454  <098 mg/dL   HDL 40  >11 mg/dL   Total CHOL/HDL Ratio 3.8     VLDL 20  0 - 40 mg/dL   LDL  Cholesterol 92  0 - 99 mg/dL   Comment:            Total Cholesterol/HDL:CHD Risk     Coronary Heart Disease Risk Table                         Men   Women      1/2 Average Risk   3.4   3.3      Average Risk       5.0   4.4      2 X Average Risk   9.6   7.1      3 X Average Risk  23.4  11.0                Use the calculated Patient Ratio     above and the CHD Risk Table     to determine the patient's CHD Risk.                ATP III CLASSIFICATION (LDL):      <100     mg/dL   Optimal      846-962  mg/dL   Near or Above                        Optimal      130-159  mg/dL   Borderline      952-841  mg/dL   High      >324     mg/dL   Very High  COMPREHENSIVE METABOLIC PANEL     Status: Abnormal   Collection Time    11/30/12  5:30 AM      Result Value Range   Sodium 142  135 - 145 mEq/L   Potassium 3.6  3.5 - 5.1 mEq/L   Chloride 108  96 - 112 mEq/L   CO2 26  19 - 32 mEq/L   Glucose, Bld 97  70 - 99 mg/dL   BUN 17  6 - 23 mg/dL   Creatinine, Ser 4.01  0.50 - 1.35 mg/dL   Calcium 8.5  8.4 - 02.7 mg/dL   Total Protein 6.1  6.0 - 8.3 g/dL   Albumin 3.0 (*) 3.5 - 5.2 g/dL   AST 13  0 - 37 U/L   ALT 12  0 - 53 U/L   Alkaline Phosphatase 22 (*) 39 - 117 U/L   Total Bilirubin 0.4  0.3 - 1.2 mg/dL   GFR calc non Af Amer 89 (*) >90 mL/min   GFR calc Af Amer >90  >90 mL/min   Comment: (NOTE)     The eGFR has been calculated using the CKD EPI equation.     This calculation has not been validated in all clinical situations.     eGFR's persistently <90 mL/min signify possible Chronic Kidney     Disease.  GLUCOSE, CAPILLARY     Status: Abnormal   Collection Time    11/30/12  9:44 AM      Result Value Range   Glucose-Capillary 118 (*) 70 - 99 mg/dL   Comment 1 Notify RN    GLUCOSE, CAPILLARY     Status: Abnormal   Collection Time    11/30/12 11:22 AM      Result Value Range   Glucose-Capillary 104 (*) 70 - 99 mg/dL   Comment 1 Notify RN    URINE RAPID DRUG SCREEN (HOSP  PERFORMED)     Status: None   Collection Time    11/30/12 12:12 PM      Result Value Range   Opiates NONE DETECTED  NONE DETECTED   Cocaine NONE DETECTED  NONE DETECTED   Benzodiazepines NONE DETECTED  NONE DETECTED   Amphetamines NONE DETECTED  NONE DETECTED   Tetrahydrocannabinol NONE DETECTED  NONE DETECTED   Barbiturates NONE DETECTED  NONE DETECTED   Comment:            DRUG SCREEN FOR MEDICAL PURPOSES     ONLY.  IF CONFIRMATION IS NEEDED     FOR ANY PURPOSE, NOTIFY LAB     WITHIN 5 DAYS.  LOWEST DETECTABLE LIMITS     FOR URINE DRUG SCREEN     Drug Class       Cutoff (ng/mL)     Amphetamine      1000     Barbiturate      200     Benzodiazepine   200     Tricyclics       300     Opiates          300     Cocaine          300     THC              50  GLUCOSE, CAPILLARY     Status: Abnormal   Collection Time    11/30/12  5:03 PM      Result Value Range   Glucose-Capillary 113 (*) 70 - 99 mg/dL   Comment 1 Notify RN     Comment 2 Documented in Chart    GLUCOSE, CAPILLARY     Status: Abnormal   Collection Time    11/30/12  8:49 PM      Result Value Range   Glucose-Capillary 108 (*) 70 - 99 mg/dL   Comment 1 Notify RN     Comment 2 Documented in Chart    CBC     Status: None   Collection Time    12/01/12  5:12 AM      Result Value Range   WBC 9.6  4.0 - 10.5 K/uL   RBC 4.62  4.22 - 5.81 MIL/uL   Hemoglobin 13.9  13.0 - 17.0 g/dL   HCT 16.1  09.6 - 04.5 %   MCV 88.7  78.0 - 100.0 fL   MCH 30.1  26.0 - 34.0 pg   MCHC 33.9  30.0 - 36.0 g/dL   RDW 40.9  81.1 - 91.4 %   Platelets 154  150 - 400 K/uL  BASIC METABOLIC PANEL     Status: Abnormal   Collection Time    12/01/12  5:12 AM      Result Value Range   Sodium 138  135 - 145 mEq/L   Potassium 3.5  3.5 - 5.1 mEq/L   Chloride 103  96 - 112 mEq/L   CO2 26  19 - 32 mEq/L   Glucose, Bld 113 (*) 70 - 99 mg/dL   BUN 16  6 - 23 mg/dL   Creatinine, Ser 7.82  0.50 - 1.35 mg/dL   Calcium 8.4  8.4 - 95.6  mg/dL   GFR calc non Af Amer 89 (*) >90 mL/min   GFR calc Af Amer >90  >90 mL/min   Comment: (NOTE)     The eGFR has been calculated using the CKD EPI equation.     This calculation has not been validated in all clinical situations.     eGFR's persistently <90 mL/min signify possible Chronic Kidney     Disease.  GLUCOSE, CAPILLARY     Status: Abnormal   Collection Time    12/01/12  7:38 AM      Result Value Range   Glucose-Capillary 111 (*) 70 - 99 mg/dL   Comment 1 Notify RN        RADIOGRAPHY: Ct Angio Head W/cm &/or Wo Cm  11/29/2012   CLINICAL DATA:  Left-sided neck pain for weeks; no documented weakness on exam.  EXAM: CT ANGIOGRAPHY HEAD AND NECK  TECHNIQUE: Multidetector CT imaging of the head and neck was performed  using the standard protocol during bolus administration of intravenous contrast. Multiplanar CT image reconstructions including MIPs were obtained to evaluate the vascular anatomy. Carotid stenosis measurements (when applicable) are obtained utilizing NASCET criteria, using the distal internal carotid diameter as the denominator.  CONTRAST:  80mL OMNIPAQUE IOHEXOL 300 MG/ML  SOLN  COMPARISON:  CT head earlier in the day  FINDINGS: CTA HEAD FINDINGS  Non stenotic atheromatous calcification of the cavernous carotids. This extends to the supraclinoid ICA on the right. No flow-limiting stenosis is evident.  The basilar artery is patent with both vertebrals contributing, right dominant. No flow-limiting stenosis or dissection.  There is no significant stenosis or occlusion of the anterior, middle, or posterior cerebral arteries. Major dural venous sinuses are patent. There is no intracranial aneurysm.  Mild atrophy with chronic microvascular ischemic change. A destructive lesion of the left occipital condyle is re demonstrated. Cross-sectional measurements are approximately 22 x 16 x 14 mm. Slight intracranial extension medially without compression of the brainstem or cerebellum. No  encroachment on the left internal carotid artery, vertebral artery, or internal jugular vein at this time. The lesion does not demonstrate avid contrast enhancement.  5 mm anterior 3rd ventricle lesion seen on noncontrast CT not clearly confirmed on this exam. Consider elective MRI brain without and with contrast for further evaluation.  No acute sinus or mastoid disease. Negative orbits. Hypertrophic change left mandibular condyle may represent an old fracture with callus.  Review of the MIP images confirms the above findings.  CTA NECK FINDINGS  Unremarkable transverse arch. Conventional anatomy of the great vessel origins without proximal stenosis. No lung apex lesion. No obvious neck masses. No flow reducing atheromatous change at the carotid bifurcations. No carotid dissection. Both vertebrals patent with the right vertebral dominant. No neck masses or visible adenopathy. Mild cervical spondylosis.  Review of the MIP images confirms the above findings.  IMPRESSION: CTA HEAD IMPRESSION  22 x 16 x 14 mm destructive lesion left occipital condyle without encroachment on the left internal carotid, left vertebral, or left internal jugular. The lesion appears insufficiently vascular and too inferior to strongly consider glomus tumor. Metastatic lesion would be the most likely consideration. Chordoma, schwannoma, or myeloma are less likely considerations.  The 5 mm hyperdense lesion seen on noncontrast CT is less evident on CTA. Significance is unclear.  No intracranial flow limiting stenosis or occlusion.  CTA NECK IMPRESSION  No extracranial stenosis or occlusion is evident. No regional adenopathy is obvious.   Electronically Signed   By: Davonna Belling M.D.   On: 11/29/2012 19:52   Dg Chest 2 View  11/29/2012   CLINICAL DATA:  Left-sided headache and neck pain  EXAM: CHEST  2 VIEW  COMPARISON:  None.  FINDINGS: The heart size and mediastinal contours are within normal limits. Both lungs are clear. The visualized  skeletal structures are unremarkable. There is scoliosis of spine.  IMPRESSION: No active cardiopulmonary disease.   Electronically Signed   By: Sherian Rein M.D.   On: 11/29/2012 19:44   Ct Head Wo Contrast  11/29/2012   CLINICAL DATA:  Headache. Acute onset of aphasia.  EXAM: CT HEAD WITHOUT CONTRAST  TECHNIQUE: Contiguous axial images were obtained from the base of the skull through the vertex without intravenous contrast.  COMPARISON:  None.  FINDINGS: There is a 2.5 cm destructive lesion of the left occipital condyle immediately adjacent to the left internal carotid artery and jugular foramen.  There is no acute intracranial hemorrhage or acute  infarct. There is mild diffuse atrophy. 5 mm colloid cyst in the anterior aspect of the 3rd ventricle. No ventricular dilatation.  There is a deformity of the left mandibular angle which may be due to an osteoma or an old fracture.  IMPRESSION: 1. Destructive lesion of the skullbase involving the left occipital condyle and carotid and jugular foramina on the left. 2. No other acute abnormality. 3. 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   Electronically Signed   By: Geanie Cooley M.D.   On: 11/29/2012 18:52   Ct Angio Neck W/cm &/or Wo/cm  11/29/2012   CLINICAL DATA:  Left-sided neck pain for weeks; no documented weakness on exam.  EXAM: CT ANGIOGRAPHY HEAD AND NECK  TECHNIQUE: Multidetector CT imaging of the head and neck was performed using the standard protocol during bolus administration of intravenous contrast. Multiplanar CT image reconstructions including MIPs were obtained to evaluate the vascular anatomy. Carotid stenosis measurements (when applicable) are obtained utilizing NASCET criteria, using the distal internal carotid diameter as the denominator.  CONTRAST:  80mL OMNIPAQUE IOHEXOL 300 MG/ML  SOLN  COMPARISON:  CT head earlier in the day  FINDINGS: CTA HEAD FINDINGS  Non stenotic atheromatous calcification of the cavernous carotids. This extends to  the supraclinoid ICA on the right. No flow-limiting stenosis is evident.  The basilar artery is patent with both vertebrals contributing, right dominant. No flow-limiting stenosis or dissection.  There is no significant stenosis or occlusion of the anterior, middle, or posterior cerebral arteries. Major dural venous sinuses are patent. There is no intracranial aneurysm.  Mild atrophy with chronic microvascular ischemic change. A destructive lesion of the left occipital condyle is re demonstrated. Cross-sectional measurements are approximately 22 x 16 x 14 mm. Slight intracranial extension medially without compression of the brainstem or cerebellum. No encroachment on the left internal carotid artery, vertebral artery, or internal jugular vein at this time. The lesion does not demonstrate avid contrast enhancement.  5 mm anterior 3rd ventricle lesion seen on noncontrast CT not clearly confirmed on this exam. Consider elective MRI brain without and with contrast for further evaluation.  No acute sinus or mastoid disease. Negative orbits. Hypertrophic change left mandibular condyle may represent an old fracture with callus.  Review of the MIP images confirms the above findings.  CTA NECK FINDINGS  Unremarkable transverse arch. Conventional anatomy of the great vessel origins without proximal stenosis. No lung apex lesion. No obvious neck masses. No flow reducing atheromatous change at the carotid bifurcations. No carotid dissection. Both vertebrals patent with the right vertebral dominant. No neck masses or visible adenopathy. Mild cervical spondylosis.  Review of the MIP images confirms the above findings.  IMPRESSION: CTA HEAD IMPRESSION  22 x 16 x 14 mm destructive lesion left occipital condyle without encroachment on the left internal carotid, left vertebral, or left internal jugular. The lesion appears insufficiently vascular and too inferior to strongly consider glomus tumor. Metastatic lesion would be the most  likely consideration. Chordoma, schwannoma, or myeloma are less likely considerations.  The 5 mm hyperdense lesion seen on noncontrast CT is less evident on CTA. Significance is unclear.  No intracranial flow limiting stenosis or occlusion.  CTA NECK IMPRESSION  No extracranial stenosis or occlusion is evident. No regional adenopathy is obvious.   Electronically Signed   By: Davonna Belling M.D.   On: 11/29/2012 19:52   Ct Chest W Contrast  11/30/2012   CLINICAL DATA:  Skull base lesion. Evaluate for primary malignancy.  EXAM:  CT CHEST, ABDOMEN, AND PELVIS WITH CONTRAST  TECHNIQUE: Multidetector CT imaging of the chest, abdomen and pelvis was performed following the standard protocol during bolus administration of intravenous contrast.  CONTRAST:  OMNIPAQUE IOHEXOL 300 MG/ML  SOLN  COMPARISON:  Plain film chest of 11/29/2012. Brain MRI of 11/30/2012.  FINDINGS: CT CHEST FINDINGS  Lungs/Pleura: Mild motion degradation. This primarily is identified superiorly. Scattered areas of subpleural nodularity, likely foci of scarring. A calcified granuloma at the right lung base. Scattered calcified left upper lobe clustered nodules which are likely also related to old granulomatous disease. Right lower lobe 5 mm nodule on image 23/ series 3. Favored also to represent an area of scarring. No typical findings of primary malignancy within the chest.  No pleural fluid.  Heart/Mediastinum: No supraclavicular adenopathy. Tortuous thoracic aorta. Mild cardiomegaly with dense coronary artery atherosclerosis. No pericardial effusion. No central pulmonary embolism, on this non-dedicated study. No mediastinal or hilar adenopathy. Contrast filled, mildly dilated thoracic esophagus on image 28.  CT ABDOMEN AND PELVIS FINDINGS  Abdomen/Pelvis: Normal liver, spleen, stomach, pancreas, adrenal glands. Stone filled gallbladder. Bilateral renal cysts with left renal sinus cysts. Punctate left renal collecting system calculus. No  retroperitoneal or retrocrural adenopathy. Extensive colonic diverticulosis. Normal terminal ileum and appendix. Normal small bowel without abdominal ascites.  No pelvic adenopathy. Contrast within the urinary bladder. Mild prostatomegaly. A tiny fat containing left inguinal hernia. No significant free fluid.  Bones/Musculoskeletal: Hemangioma within the T12 vertebral body. A lucent lesion within the left side of the T1 vertebral body is approximately 7 mm and too small to characterize. Favored also to represent a hemangioma.  IMPRESSION: CT CHEST IMPRESSION  1. No evidence of primary malignancy within the chest. Scattered tiny pulmonary nodules, primarily felt to represent areas of scarring and sequelae of old granulomatous disease.  2.  Advanced coronary artery atherosclerosis.  3. Esophageal air fluid level suggests dysmotility or gastroesophageal reflux.  CT ABDOMEN AND PELVIS IMPRESSION  1. No primary malignancy identified within the abdomen or pelvis. 2. Cholelithiasis. 3. Left nephrolithiasis.   Electronically Signed   By: Jeronimo Greaves M.D.   On: 11/30/2012 17:14   Mr Maxine Glenn Head Wo Contrast  11/30/2012   CLINICAL DATA:  Left-sided weakness.  EXAM: MRA HEAD WITHOUT CONTRAST  TECHNIQUE: MRA HEAD WITHOUT CONTRAST  COMPARISON:  CTA head and neck 11/30/2012  FINDINGS: The internal carotid arteries are within normal limits from the high cervical segments through the ICA termini bilaterally. The A1 and M1 segments are normal. No definite anterior communicating artery is present. The MCA bifurcations are within normal limits. ACA and MCA branch vessels are within normal limits.  The right vertebral artery is the dominant vessel. Attenuation of the proximal left vertebral artery is artifactual based on the prior CTA. Signal loss is evident adjacent to the skull base tumor. The vertebrobasilar junction is within normal limits. Both posterior cerebral arteries originate from the basilar tip. There is some attenuation  of distal PCA branch vessels.  IMPRESSION: 1. Mild distal small vessel disease. 2. Signal attenuation of the proximal left vertebral artery. This may be artifactual adjacent to the skull base tumor. The left vertebral artery appeared normal on the CTA.   Electronically Signed   By: Gennette Pac M.D.   On: 11/30/2012 10:10   Mri Brain Without Contrast  11/30/2012   CLINICAL DATA:  Left-sided weakness.  Abnormal CTA neck and head.  EXAM: MRI HEAD WITHOUT CONTRAST  TECHNIQUE: Multiplanar, multisequence MR imaging was  performed. No intravenous contrast was administered.  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 This is at the level of the left hypoglossal canal and below. The lesion is slightly T2 hyperintense and intermediate density on T1 weighted images. The lesion is heterogeneous with some restricted diffusion. The bone is slightly expanded. The tumor appears to be confined to the bone the  No acute infarct or hemorrhage is present. There is no parenchymal mass lesion. Atrophy and white matter disease is somewhat advanced for age. Flow is present in the major intracranial arteries. The globes and orbits are intact. The paranasal sinuses and mastoid air cells are clear.  No definite tongue atrophy is present.  IMPRESSION: 1. 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 Please correlate with any known primary tumor. No other skull lesions are evident. The a paraganglioma is still considered. This is not typical of a schwannoma. Chordoma is also considered less likely. 2. No acute parenchymal abnormality. 3. Atrophy and white matter disease is advanced for age. This likely reflects the sequelae of chronic microvascular ischemia.   Electronically Signed   By: Gennette Pac M.D.   On: 11/30/2012 10:24   Ct Abdomen Pelvis W  Contrast  11/30/2012   CLINICAL DATA:  Skull base lesion. Evaluate for primary malignancy.  EXAM: CT CHEST, ABDOMEN, AND PELVIS WITH CONTRAST  TECHNIQUE: Multidetector CT imaging of the chest, abdomen and pelvis was performed following the standard protocol during bolus administration of intravenous contrast.  CONTRAST:  OMNIPAQUE IOHEXOL 300 MG/ML  SOLN  COMPARISON:  Plain film chest of 11/29/2012. Brain MRI of 11/30/2012.  FINDINGS: CT CHEST FINDINGS  Lungs/Pleura: Mild motion degradation. This primarily is identified superiorly. Scattered areas of subpleural nodularity, likely foci of scarring. A calcified granuloma at the right lung base. Scattered calcified left upper lobe clustered nodules which are likely also related to old granulomatous disease. Right lower lobe 5 mm nodule on image 23/ series 3. Favored also to represent an area of scarring. No typical findings of primary malignancy within the chest.  No pleural fluid.  Heart/Mediastinum: No supraclavicular adenopathy. Tortuous thoracic aorta. Mild cardiomegaly with dense coronary artery atherosclerosis. No pericardial effusion. No central pulmonary embolism, on this non-dedicated study. No mediastinal or hilar adenopathy. Contrast filled, mildly dilated thoracic esophagus on image 28.  CT ABDOMEN AND PELVIS FINDINGS  Abdomen/Pelvis: Normal liver, spleen, stomach, pancreas, adrenal glands. Stone filled gallbladder. Bilateral renal cysts with left renal sinus cysts. Punctate left renal collecting system calculus. No retroperitoneal or retrocrural adenopathy. Extensive colonic diverticulosis. Normal terminal ileum and appendix. Normal small bowel without abdominal ascites.  No pelvic adenopathy. Contrast within the urinary bladder. Mild prostatomegaly. A tiny fat containing left inguinal hernia. No significant free fluid.  Bones/Musculoskeletal: Hemangioma within the T12 vertebral body. A lucent lesion within the left side of the T1 vertebral body is  approximately 7 mm and too small to characterize. Favored also to represent a hemangioma.  IMPRESSION: CT CHEST IMPRESSION  1. No evidence of primary malignancy within the chest. Scattered tiny pulmonary nodules, primarily felt to represent areas of scarring and sequelae of old granulomatous disease.  2.  Advanced coronary artery atherosclerosis.  3. Esophageal air fluid level suggests dysmotility or gastroesophageal reflux.  CT ABDOMEN AND PELVIS IMPRESSION  1. No primary malignancy identified within  the abdomen or pelvis. 2. Cholelithiasis. 3. Left nephrolithiasis.   Electronically Signed   By: Jeronimo Greaves M.D.   On: 11/30/2012 17:14   US Carotid Duplex Bilateral  11/30/2012   CLINICAL DATA:  Cerebrovascular accident.  EXAM: BILATERAL CAROTID DUPLEX ULTRASOUND  TECHNIQUE: Wallace Cullens scale imaging, color Doppler and duplex ultrasound were performed of bilateral carotid and vertebral arteries in the neck.  COMPARISON:  None.  FINDINGS: Criteria: Quantification of carotid stenosis is based on velocity parameters that correlate the residual internal carotid diameter with NASCET-based stenosis levels, using the diameter of the distal internal carotid lumen as the denominator for stenosis measurement.  The following velocity measurements were obtained:  RIGHT  ICA:  101/15 cm/sec  CCA:  104/19 cm/sec  SYSTOLIC ICA/CCA RATIO:  0.97  DIASTOLIC ICA/CCA RATIO:  0.81  ECA:  98 cm/sec  LEFT  ICA:  72/19 cm/sec  CCA:  101/19 cm/sec  SYSTOLIC ICA/CCA RATIO:  0.72  DIASTOLIC ICA/CCA RATIO:  0.99  ECA:  92 cm/sec  RIGHT CAROTID ARTERY: Minimal plaque formation is noted in the distal right common carotid artery and proximal right internal carotid artery consistent with less than 50% diameter stenosis based on Doppler criteria.  RIGHT VERTEBRAL ARTERY:  Antegrade flow is noted.  LEFT CAROTID ARTERY: Minimal plaque formation is noted in the distal left common carotid artery and proximal left internal carotid artery consistent with  less than 50% diameter stenosis based on Doppler criteria.  LEFT VERTEBRAL ARTERY:  Antegrade flow is noted.  IMPRESSION: Minimal plaque formation is seen in both proximal internal carotid arteries consistent with less than 50% diameter stenosis based on Doppler criteria.   Electronically Signed   By: Roque Lias M.D.   On: 11/30/2012 09:07       ASSESSMENT:  1. 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  CT CAP is negative for metastatic work-up.  Etiology unknown but the differential is wide including metastatic disease versus primary lesion versus hemangioma versus occipital condyle syndrome.  Will further to work-up today and outlined below in plan. 2. Transient slurred speech 3. BPH 4. H/O EtOH abuse   PLAN:  1. Further work-up of skull base lesion: PSA, ANA, CRP, SPEP with IFE, B2M, UPEP with IFE, skeletal survey. 2. Recommend Neurology/Neurosurgery consult  3. Recommend immobilization collar for symptom management. 4. Will follow as an inpatient and recommend follow-up appointment with [facility] 1 week from discharge from hospital.   All questions were answered. The patient knows to call the clinic with any problems, questions or concerns. We can certainly see the patient much sooner if necessary.  Patient and plan discussed with Dr. Alla German and he is in agreement with the aforementioned.    KEFALAS,THOMAS   Attending:  Most likely malignancies presenting this way include prostate ca, small cell lung ca, and myeloma vs. Inflammatory diseases.  Workup in progress.  Might suggest radiotherapy evaluation for definitive therapy. Neurosurgical opinion and Interventional radiology opinion regarding biopsy. Dr.Felipa Laroche

## 2012-12-01 NOTE — Consult Note (Signed)
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 artery. This may be artifactual adjacent to the skull base tumor. The left vertebral artery appeared normal on the CTA.   Electronically Signed   By: Gennette Pac M.D.   On: 11/30/2012 10:10   Mri Brain Without Contrast  11/30/2012   CLINICAL DATA:  Left-sided weakness.  Abnormal CTA neck and head.  EXAM: MRI HEAD WITHOUT CONTRAST  TECHNIQUE: Multiplanar, multisequence MR imaging was performed. No intravenous contrast was administered.  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 This is at the level of the left hypoglossal canal and below. The lesion is slightly T2 hyperintense and intermediate density on T1 weighted images. The lesion is heterogeneous with some restricted diffusion. The bone is slightly expanded. The tumor appears to be confined to the bone the  No acute infarct or hemorrhage is present. There is no parenchymal mass lesion. Atrophy and white matter disease is somewhat advanced for age. Flow is present in the major intracranial arteries. The  globes and orbits are intact. The paranasal sinuses and mastoid air cells are clear.  No definite tongue atrophy is present.  IMPRESSION: 1. 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 Please correlate with any known primary tumor. No other skull lesions are evident. The a paraganglioma is still considered. This is not typical of a schwannoma. Chordoma is also considered less likely. 2. No acute parenchymal abnormality. 3. Atrophy and white matter disease is advanced for age. This likely reflects the sequelae of chronic microvascular ischemia.   Electronically Signed   By: Gennette Pac M.D.   On: 11/30/2012 10:24   Ct Abdomen Pelvis W Contrast  11/30/2012   CLINICAL DATA:  Skull base lesion. Evaluate for primary malignancy.  EXAM: CT CHEST, ABDOMEN, AND PELVIS WITH CONTRAST  TECHNIQUE: Multidetector CT imaging of the chest, abdomen and pelvis was performed following the standard protocol during bolus administration of intravenous contrast.  CONTRAST:  OMNIPAQUE IOHEXOL 300 MG/ML  SOLN  COMPARISON:  Plain film chest of 11/29/2012. Brain MRI of 11/30/2012.  FINDINGS: CT CHEST FINDINGS  Lungs/Pleura: Mild motion degradation. This primarily is identified superiorly. Scattered areas of subpleural nodularity, likely foci of scarring. A calcified granuloma at the right lung base. Scattered calcified left upper lobe clustered nodules which are likely also related to old granulomatous disease. Right lower lobe 5 mm nodule on image 23/ series 3. Favored also to represent an area of scarring. No typical findings of primary malignancy within the chest.  No pleural fluid.  Heart/Mediastinum: No supraclavicular adenopathy. Tortuous thoracic aorta. Mild cardiomegaly with dense coronary artery atherosclerosis. No pericardial effusion. No central pulmonary embolism, on this non-dedicated study. No  mediastinal or hilar adenopathy. Contrast filled, mildly dilated thoracic esophagus on image 28.  CT ABDOMEN AND PELVIS FINDINGS  Abdomen/Pelvis: Normal liver, spleen, stomach, pancreas, adrenal glands. Stone filled gallbladder. Bilateral renal cysts with left renal sinus cysts. Punctate left renal collecting system calculus. No retroperitoneal or retrocrural adenopathy. Extensive colonic diverticulosis. Normal terminal ileum and appendix. Normal small bowel without abdominal ascites.  No  pelvic adenopathy. Contrast within the urinary bladder. Mild prostatomegaly. A tiny fat containing left inguinal hernia. No significant free fluid.  Bones/Musculoskeletal: Hemangioma within the T12 vertebral body. A lucent lesion within the left side of the T1 vertebral body is approximately 7 mm and too small to characterize. Favored also to represent a hemangioma.  IMPRESSION: CT CHEST IMPRESSION  1. No evidence of primary malignancy within the chest. Scattered tiny pulmonary nodules, primarily felt to represent areas of scarring and sequelae of old granulomatous disease.  2.  Advanced coronary artery atherosclerosis.  3. Esophageal air fluid level suggests dysmotility or gastroesophageal reflux.  CT ABDOMEN AND PELVIS IMPRESSION  1. No primary malignancy identified within the abdomen or pelvis. 2. Cholelithiasis. 3. Left nephrolithiasis.   Electronically Signed   By: Jeronimo Greaves M.D.   On: 11/30/2012 17:14   US Carotid Duplex Bilateral  11/30/2012   CLINICAL DATA:  Cerebrovascular accident.  EXAM: BILATERAL CAROTID DUPLEX ULTRASOUND  TECHNIQUE: Wallace Cullens scale imaging, color Doppler and duplex ultrasound were performed of bilateral carotid and vertebral arteries in the neck.  COMPARISON:  None.  FINDINGS: Criteria: Quantification of carotid stenosis is based on velocity parameters that correlate the residual internal carotid diameter with NASCET-based stenosis levels, using the diameter of the distal internal carotid lumen as  the denominator for stenosis measurement.  The following velocity measurements were obtained:  RIGHT  ICA:  101/15 cm/sec  CCA:  104/19 cm/sec  SYSTOLIC ICA/CCA RATIO:  0.97  DIASTOLIC ICA/CCA RATIO:  0.81  ECA:  98 cm/sec  LEFT  ICA:  72/19 cm/sec  CCA:  101/19 cm/sec  SYSTOLIC ICA/CCA RATIO:  0.72  DIASTOLIC ICA/CCA RATIO:  0.99  ECA:  92 cm/sec  RIGHT CAROTID ARTERY: Minimal plaque formation is noted in the distal right common carotid artery and proximal right internal carotid artery consistent with less than 50% diameter stenosis based on Doppler criteria.  RIGHT VERTEBRAL ARTERY:  Antegrade flow is noted.  LEFT CAROTID ARTERY: Minimal plaque formation is noted in the distal left common carotid artery and proximal left internal carotid artery consistent with less than 50% diameter stenosis based on Doppler criteria.  LEFT VERTEBRAL ARTERY:  Antegrade flow is noted.  IMPRESSION: Minimal plaque formation is seen in both proximal internal carotid arteries consistent with less than 50% diameter stenosis based on Doppler criteria.   Electronically Signed   By: Roque Lias M.D.   On: 11/30/2012 09:07        Yashas Camilli A. Dean [name], M.D.  Diplomate, Biomedical engineer of Psychiatry and Neurology ( Neurology). 12/01/2012, 8:52 AM

## 2012-12-01 NOTE — Discharge Summary (Signed)
Physician Discharge Summary  Dream Nodal WUJ:[id number] DOB: April 20, 1935 DOA: 11/29/2012  PCP:   Leo Grosser, MD  Admit date: 11/29/2012 Discharge date: 12/01/2012  Recommendations for Outpatient Follow-up:  1. Followup left occipital skull mass   Follow-up Information   Follow up with [facility] In 1 week. (to see MD)    Contact information:   [address] [place] [address]       Follow up with [facility], MD. (office will contact you for appointment)    Specialty:  Neurosurgery   Contact information:   [address] [place] [address] [phone]      Discharge Diagnoses:  1. Left occipital skull lesion 2. Transient aphasia, left-sided weakness 3. Tortuous thoracic aorta 4. Coronary artery disease seen on CT, incidental finding  Discharge Condition: Improved Disposition: Home  Diet recommendation: Healthy  Filed Weights   11/29/12 1746  Weight: 124.739 kg (275 lb)    History of present illness:  77 year old man presented to the emergency department with complaint of left-sided headache present for 6 weeks, associated with radiation to the left side of the neck. Noted to have an episode of a seizure with left-sided weakness which lasted for a few minutes. The time of admission patient had recovered neurologically but neck and headache pain persisted. CT of the head revealed a 2.5 mm destructive lesion in the occipital condyle. Patient was admitted for further evaluation of this as well as TIA evaluation.  Hospital Course:  Mr. Sansoucie was admitted for further evaluation of transient slurred speech as well as persistent neck pain and imaging finding of skull mass. He was seen in consultation with neurology who felt this constellation of signs and symptoms referrable to this, and recommended further evaluation with PET scan as an outpatient as well as supportive care. Stroke evaluation was undertaken and was negative.  He was evaluated by oncology who recommended further laboratory evaluation as well as imaging testing and outpatient followup as listed below. Case was discussed with neurosurgery as below and patient was felt to be stable for discharge with outpatient followup. Patient was treated with Robaxin for left neck muscle strain and discharged feels much improved "I have not felt this good in a week".  1. 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 Consider PET scan as outpatient. CT CHEST, ABDOMEN AND PELVIS negative for metastatic work-up. Etiology unknown but the differential is wide including metastatic disease versus primary lesion versus hemangioma versus occipital condyle syndrome. PSA, ANA, CRP, SPEP with IFE, B2M, UPEP with IFE. Skeletal survey negative. Most likely malignancies to present this way would include prostate cancer, small cell lung cancer, myeloma versus inflammatory diseases. Radiotherapy to be considered as an outpatient.  2. Transient aphasia, left-sided weakness, resolved on admission: Felt to be secondary to skull base lesion. Resolved. No recurrence. Stroke evaluation negative. 3. Neuropathy: Unknown etiology. Continue gabapentin. 4. GERD: Stable. 5. Torturous thoracic aorta. 6. CAD seen on CT: Incidental finding. Followup as an outpatient.  Discussed with Dr. Wynetta Emery, neurosurgery. He has reviewed the films. He recommends a soft neck collar, activity as tolerated, followup with him in one week. He recommended CT guided biopsy if possible, discussed with Dr. Luretha Rued is not felt to be a candidate for percutaneous biopsy. Also recommended cervical flexion and extension film. Discussed with Dr. Zigmund Daniel who concurs with the above.   Neurosurgery consult as outpatient  Robaxin for neck strain  Followup with  [facility] in one week post discharge  Home with cervical collar   Pending studies:  ANA  PSA   CRP  Multiple myeloma panel  24 hour urine  UPEP  Skeletal survey  Discharge Instructions  Discharge Orders   Future Orders Complete By Expires   Diet - low sodium heart healthy  As directed    Discharge instructions  As directed    Comments:     Wear soft cervical collar for comfort. Call physician or seek immediate medical attention for increased pain, new weakness or worsening of condition.       Medication List    STOP taking these medications       ibuprofen 800 MG tablet  Commonly known as:  ADVIL,MOTRIN      TAKE these medications       ALEVE 220 MG tablet  Generic drug:  naproxen sodium  Take 440 mg by mouth once as needed (for pain).     ALPRAZolam 1 MG tablet  Commonly known as:  XANAX  Take 1 mg by mouth 2 (two) times daily as needed. For anxiety and/or sleep     gabapentin 400 MG capsule  Commonly known as:  NEURONTIN  Take 400 mg by mouth 4 (four) times daily. May take up to five times daily depending on pain in feet     methocarbamol 500 MG tablet  Commonly known as:  ROBAXIN  Take 1 tablet (500 mg total) by mouth every 6 (six) hours as needed (muscle spasm).     oxyCODONE-acetaminophen 5-325 MG per tablet  Commonly known as:  PERCOCET/ROXICET  Take 1-2 tablets by mouth every 4 (four) hours as needed (moderate pain).     tamsulosin 0.4 MG Caps capsule  Commonly known as:  FLOMAX  Take 0.4 mg by mouth every evening.     triamcinolone cream 0.1 %  Commonly known as:  KENALOG  Apply 1 application topically 2 (two) times daily.       Allergies  Allergen Reactions  . Codeine Other (See Comments)    Severe eadache    The results of significant diagnostics from this hospitalization (including imaging, microbiology, ancillary and laboratory) are listed below for reference.    Significant Diagnostic Studies: Ct Angio Head W/cm &/or Wo Cm  11/29/2012   CLINICAL DATA:  Left-sided neck pain for weeks; no documented weakness on exam.  EXAM: CT  ANGIOGRAPHY HEAD AND NECK  TECHNIQUE: Multidetector CT imaging of the head and neck was performed using the standard protocol during bolus administration of intravenous contrast. Multiplanar CT image reconstructions including MIPs were obtained to evaluate the vascular anatomy. Carotid stenosis measurements (when applicable) are obtained utilizing NASCET criteria, using the distal internal carotid diameter as the denominator.  CONTRAST:  80mL OMNIPAQUE IOHEXOL 300 MG/ML  SOLN  COMPARISON:  CT head earlier in the day  FINDINGS: CTA HEAD FINDINGS  Non stenotic atheromatous calcification of the cavernous carotids. This extends to the supraclinoid ICA on the right. No flow-limiting stenosis is evident.  The basilar artery is patent with both vertebrals contributing, right dominant. No flow-limiting stenosis or dissection.  There is no significant stenosis or occlusion of the anterior, middle, or posterior cerebral arteries. Major dural venous sinuses are patent. There is no intracranial aneurysm.  Mild atrophy with chronic microvascular ischemic change. A destructive lesion of the left occipital condyle is re demonstrated. Cross-sectional measurements are approximately 22 x 16 x 14 mm. Slight intracranial extension medially without compression of  the brainstem or cerebellum. No encroachment on the left internal carotid artery, vertebral artery, or internal jugular vein at this time. The lesion does not demonstrate avid contrast enhancement.  5 mm anterior 3rd ventricle lesion seen on noncontrast CT not clearly confirmed on this exam. Consider elective MRI brain without and with contrast for further evaluation.  No acute sinus or mastoid disease. Negative orbits. Hypertrophic change left mandibular condyle may represent an old fracture with callus.  Review of the MIP images confirms the above findings.  CTA NECK FINDINGS  Unremarkable transverse arch. Conventional anatomy of the great vessel origins without proximal  stenosis. No lung apex lesion. No obvious neck masses. No flow reducing atheromatous change at the carotid bifurcations. No carotid dissection. Both vertebrals patent with the right vertebral dominant. No neck masses or visible adenopathy. Mild cervical spondylosis.  Review of the MIP images confirms the above findings.  IMPRESSION: CTA HEAD IMPRESSION  22 x 16 x 14 mm destructive lesion left occipital condyle without encroachment on the left internal carotid, left vertebral, or left internal jugular. The lesion appears insufficiently vascular and too inferior to strongly consider glomus tumor. Metastatic lesion would be the most likely consideration. Chordoma, schwannoma, or myeloma are less likely considerations.  The 5 mm hyperdense lesion seen on noncontrast CT is less evident on CTA. Significance is unclear.  No intracranial flow limiting stenosis or occlusion.  CTA NECK IMPRESSION  No extracranial stenosis or occlusion is evident. No regional adenopathy is obvious.   Electronically Signed   By: Davonna Belling M.D.   On: 11/29/2012 19:52   Dg Chest 2 View  11/29/2012   CLINICAL DATA:  Left-sided headache and neck pain  EXAM: CHEST  2 VIEW  COMPARISON:  None.  FINDINGS: The heart size and mediastinal contours are within normal limits. Both lungs are clear. The visualized skeletal structures are unremarkable. There is scoliosis of spine.  IMPRESSION: No active cardiopulmonary disease.   Electronically Signed   By: Sherian Rein M.D.   On: 11/29/2012 19:44   Dg Cervical Spine With Flex & Extend  12/01/2012   CLINICAL DATA:  Occipital condyle mass.  Assess for instability.  EXAM: CERVICAL SPINE COMPLETE WITH FLEXION AND EXTENSION VIEWS  COMPARISON:  None.  FINDINGS: The cervical spine is visualized from the skullbase through C7 on the lateral views. The craniocervical junction is incompletely visualized. And  The prevertebral soft tissues are normal. Multilevel endplate degenerative changes are present.  Slight retrolisthesis at C3-4 is exaggerated and extension and reduced in flexion. This may be physiologic.  The upright images demonstrate slight leftward curvature.  The atlantooccipital joint appears to be intact. There is no instability. The lesion along the left occipital condyle and petrous bone is not visualized.  IMPRESSION: 1. Slight movements tissue with retrolisthesis at C3-4 is likely physiologic. 2. Multilevel endplate degenerative changes without other significant movement. 3. Stability of the atlantooccipital joint.   Electronically Signed   By: Gennette Pac M.D.   On: 12/01/2012 16:06   Ct Head Wo Contrast  11/29/2012   CLINICAL DATA:  Headache. Acute onset of aphasia.  EXAM: CT HEAD WITHOUT CONTRAST  TECHNIQUE: Contiguous axial images were obtained from the base of the skull through the vertex without intravenous contrast.  COMPARISON:  None.  FINDINGS: There is a 2.5 cm destructive lesion of the left occipital condyle immediately adjacent to the left internal carotid artery and jugular foramen.  There is no acute intracranial hemorrhage or acute infarct. There is  mild diffuse atrophy. 5 mm colloid cyst in the anterior aspect of the 3rd ventricle. No ventricular dilatation.  There is a deformity of the left mandibular angle which may be due to an osteoma or an old fracture.  IMPRESSION: 1. Destructive lesion of the skullbase involving the left occipital condyle and carotid and jugular foramina on the left. 2. No other acute abnormality. 3. 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   Electronically Signed   By: Geanie Cooley M.D.   On: 11/29/2012 18:52   Ct Chest W Contrast  11/30/2012   CLINICAL DATA:  Skull base lesion. Evaluate for primary malignancy.  EXAM: CT CHEST, ABDOMEN, AND PELVIS WITH CONTRAST  TECHNIQUE: Multidetector CT imaging of the chest, abdomen and pelvis was performed following the standard protocol during bolus administration of intravenous contrast.  CONTRAST:   OMNIPAQUE IOHEXOL 300 MG/ML  SOLN  COMPARISON:  Plain film chest of 11/29/2012. Brain MRI of 11/30/2012.  FINDINGS: CT CHEST FINDINGS  Lungs/Pleura: Mild motion degradation. This primarily is identified superiorly. Scattered areas of subpleural nodularity, likely foci of scarring. A calcified granuloma at the right lung base. Scattered calcified left upper lobe clustered nodules which are likely also related to old granulomatous disease. Right lower lobe 5 mm nodule on image 23/ series 3. Favored also to represent an area of scarring. No typical findings of primary malignancy within the chest.  No pleural fluid.  Heart/Mediastinum: No supraclavicular adenopathy. Tortuous thoracic aorta. Mild cardiomegaly with dense coronary artery atherosclerosis. No pericardial effusion. No central pulmonary embolism, on this non-dedicated study. No mediastinal or hilar adenopathy. Contrast filled, mildly dilated thoracic esophagus on image 28.  CT ABDOMEN AND PELVIS FINDINGS  Abdomen/Pelvis: Normal liver, spleen, stomach, pancreas, adrenal glands. Stone filled gallbladder. Bilateral renal cysts with left renal sinus cysts. Punctate left renal collecting system calculus. No retroperitoneal or retrocrural adenopathy. Extensive colonic diverticulosis. Normal terminal ileum and appendix. Normal small bowel without abdominal ascites.  No pelvic adenopathy. Contrast within the urinary bladder. Mild prostatomegaly. A tiny fat containing left inguinal hernia. No significant free fluid.  Bones/Musculoskeletal: Hemangioma within the T12 vertebral body. A lucent lesion within the left side of the T1 vertebral body is approximately 7 mm and too small to characterize. Favored also to represent a hemangioma.  IMPRESSION: CT CHEST IMPRESSION  1. No evidence of primary malignancy within the chest. Scattered tiny pulmonary nodules, primarily felt to represent areas of scarring and sequelae of old granulomatous disease.  2.  Advanced coronary  artery atherosclerosis.  3. Esophageal air fluid level suggests dysmotility or gastroesophageal reflux.  CT ABDOMEN AND PELVIS IMPRESSION  1. No primary malignancy identified within the abdomen or pelvis. 2. Cholelithiasis. 3. Left nephrolithiasis.   Electronically Signed   By: Jeronimo Greaves M.D.   On: 11/30/2012 17:14   Mr Maxine Glenn Head Wo Contrast  11/30/2012   CLINICAL DATA:  Left-sided weakness.  EXAM: MRA HEAD WITHOUT CONTRAST  TECHNIQUE: MRA HEAD WITHOUT CONTRAST  COMPARISON:  CTA head and neck 11/30/2012  FINDINGS: The internal carotid arteries are within normal limits from the high cervical segments through the ICA termini bilaterally. The A1 and M1 segments are normal. No definite anterior communicating artery is present. The MCA bifurcations are within normal limits. ACA and MCA branch vessels are within normal limits.  The right vertebral artery is the dominant vessel. Attenuation of the proximal left vertebral artery is artifactual based on the prior CTA. Signal loss is evident adjacent to the skull base tumor.  The vertebrobasilar junction is within normal limits. Both posterior cerebral arteries originate from the basilar tip. There is some attenuation of distal PCA branch vessels.  IMPRESSION: 1. Mild distal small vessel disease. 2. Signal attenuation of the proximal left vertebral artery. This may be artifactual adjacent to the skull base tumor. The left vertebral artery appeared normal on the CTA.   Electronically Signed   By: Gennette Pac M.D.   On: 11/30/2012 10:10   Mri Brain Without Contrast  11/30/2012   CLINICAL DATA:  Left-sided weakness.  Abnormal CTA neck and head.  EXAM: MRI HEAD WITHOUT CONTRAST  TECHNIQUE: Multiplanar, multisequence MR imaging was performed. No intravenous contrast was administered.  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 This is at the level of the left hypoglossal canal and below. The lesion is slightly  T2 hyperintense and intermediate density on T1 weighted images. The lesion is heterogeneous with some restricted diffusion. The bone is slightly expanded. The tumor appears to be confined to the bone the  No acute infarct or hemorrhage is present. There is no parenchymal mass lesion. Atrophy and white matter disease is somewhat advanced for age. Flow is present in the major intracranial arteries. The globes and orbits are intact. The paranasal sinuses and mastoid air cells are clear.  No definite tongue atrophy is present.  IMPRESSION: 1. 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 Please correlate with any known primary tumor. No other skull lesions are evident. The a paraganglioma is still considered. This is not typical of a schwannoma. Chordoma is also considered less likely. 2. No acute parenchymal abnormality. 3. Atrophy and white matter disease is advanced for age. This likely reflects the sequelae of chronic microvascular ischemia.   Electronically Signed   By: Gennette Pac M.D.   On: 11/30/2012 10:24   US Carotid Duplex Bilateral  11/30/2012   CLINICAL DATA:  Cerebrovascular accident.  EXAM: BILATERAL CAROTID DUPLEX ULTRASOUND  TECHNIQUE: Wallace Cullens scale imaging, color Doppler and duplex ultrasound were performed of bilateral carotid and vertebral arteries in the neck.  COMPARISON:  None.  FINDINGS: Criteria: Quantification of carotid stenosis is based on velocity parameters that correlate the residual internal carotid diameter with NASCET-based stenosis levels, using the diameter of the distal internal carotid lumen as the denominator for stenosis measurement.  The following velocity measurements were obtained:  RIGHT  ICA:  101/15 cm/sec  CCA:  104/19 cm/sec  SYSTOLIC ICA/CCA RATIO:  0.97  DIASTOLIC ICA/CCA RATIO:  0.81  ECA:  98 cm/sec  LEFT  ICA:  72/19 cm/sec  CCA:  101/19 cm/sec   SYSTOLIC ICA/CCA RATIO:  0.72  DIASTOLIC ICA/CCA RATIO:  0.99  ECA:  92 cm/sec  RIGHT CAROTID ARTERY: Minimal plaque formation is noted in the distal right common carotid artery and proximal right internal carotid artery consistent with less than 50% diameter stenosis based on Doppler criteria.  RIGHT VERTEBRAL ARTERY:  Antegrade flow is noted.  LEFT CAROTID ARTERY: Minimal plaque formation is noted in the distal left common carotid artery and proximal left internal carotid artery consistent with less than 50% diameter stenosis based on Doppler criteria.  LEFT VERTEBRAL ARTERY:  Antegrade flow is noted.  IMPRESSION: Minimal plaque formation is seen in both proximal internal carotid arteries consistent with less than 50% diameter stenosis based on  Doppler criteria.   Electronically Signed   By: Roque Lias M.D.   On: 11/30/2012 09:07   Dg Bone Survey Met  12/01/2012   CLINICAL DATA:  Occipital condyle mass. Evaluate for multiple myeloma lesions.  EXAM: METASTATIC BONE SURVEY  COMPARISON:  CT chest abdomen pelvis 11/30/2012.  FINDINGS: AP and lateral cervical spine views are included in a dedicated exam performed the same day. Frontal view of the chest shows midline trachea and normal heart size. Lungs are clear. No pleural fluid.  No definite lucent lesions. The frontal view of the lumbar spine is degraded by motion. Mild superior endplate compression involving the L3 vertebral body, as on yesterday's exam. Multilevel spondylosis.  IMPRESSION: 1. No lytic lesions to indicate multiple myeloma. 2. AP and lateral views of the cervical spine are included in the dedicated cervical spine series performed the same day. 3. L3 superior endplate compression fracture, as on 11/30/2012.   Electronically Signed   By: Leanna Battles M.D.   On: 12/01/2012 15:34   Labs: Basic Metabolic Panel:  Recent Labs Lab 11/29/12 1756 11/30/12 0530 12/01/12 0512  NA 141 142 138  K 3.8 3.6 3.5  CL 108 108 103  CO2 24 26 26    GLUCOSE 104* 97 113*  BUN 20 17 16   CREATININE 0.82 0.73 0.73  CALCIUM 8.8 8.5 8.4   Liver Function Tests:  Recent Labs Lab 11/30/12 0530  AST 13  ALT 12  ALKPHOS 22*  BILITOT 0.4  PROT 6.1  ALBUMIN 3.0*   CBC:  Recent Labs Lab 11/29/12 1756 12/01/12 0512  WBC 8.5 9.6  NEUTROABS 4.5  --   HGB 13.9 13.9  HCT 40.8 41.0  MCV 88.5 88.7  PLT 152 154   CBG:  Recent Labs Lab 11/30/12 1122 11/30/12 1703 11/30/12 2049 12/01/12 0738 12/01/12 1150  GLUCAP 104* 113* 108* 111* 109*    Active Problems:   TIA (transient ischemic attack)   Skull lesion   Time coordinating discharge: 50 minutes  Signed:  Brendia Sacks, MD [facility] 12/01/2012, 4:57 PM

## 2012-12-01 NOTE — Progress Notes (Signed)
Patient discharged home with wife.  IV removed - WNL.  Instructed to [facility] with [facility], and instructed that neurosugery would be calling to set up appt as well.  Pt instructed on new meds and advised to seek medical care if pain and symptoms worsen.  Verbalizes understanding.  Patient signed up for mychart.  No questions at this time.  Pt left floor in stable condition via WC with NT.

## 2012-12-02 LAB — PROTEIN ELECTROPHORESIS, SERUM
Albumin ELP: 57.2 % (ref 55.8–66.1)
Alpha-1-Globulin: 4.8 % (ref 2.9–4.9)
Alpha-2-Globulin: 11.6 % (ref 7.1–11.8)
Beta 2: 4 % (ref 3.2–6.5)
Beta Globulin: 6.2 % (ref 4.7–7.2)
Gamma Globulin: 16.2 % (ref 11.1–18.8)
M-Spike, %: 0.63 g/dL
Total Protein ELP: 6 g/dL (ref 6.0–8.3)

## 2012-12-02 LAB — PSA: PSA: 3.01 ng/mL (ref <=4.00)

## 2012-12-02 LAB — ANA: Anti Nuclear Antibody(ANA): NEGATIVE

## 2012-12-02 LAB — BETA 2 MICROGLOBULIN, SERUM: Beta-2 Microglobulin: 1.42 mg/L (ref 1.01–1.73)

## 2012-12-02 LAB — IMMUNOFIXATION, URINE

## 2012-12-03 LAB — UIFE/LIGHT CHAINS/TP QN, 24-HR UR
Albumin, U: DETECTED
Alpha 1, Urine: DETECTED — AB
Alpha 2, Urine: DETECTED — AB
Beta, Urine: DETECTED — AB
Free Kappa Lt Chains,Ur: 0.82 mg/dL (ref 0.14–2.42)
Free Kappa/Lambda Ratio: 13.67 ratio — ABNORMAL HIGH (ref 2.04–10.37)
Free Lambda Excretion/Day: 0.02 mg/d
Free Lambda Lt Chains,Ur: 0.06 mg/dL (ref 0.02–0.67)
Free Lt Chn Excr Rate: 0.29 mg/d
Gamma Globulin, Urine: DETECTED — AB
Time: 24 hours
Total Protein, Urine-Ur/day: 0 mg/d — ABNORMAL LOW (ref 10–140)
Total Protein, Urine: 1.4 mg/dL
Volume, Urine: 35 mL

## 2012-12-03 LAB — MULTIPLE MYELOMA PANEL, SERUM
Albumin ELP: 54.9 % — ABNORMAL LOW (ref 55.8–66.1)
Alpha-1-Globulin: 5.5 % — ABNORMAL HIGH (ref 2.9–4.9)
Alpha-2-Globulin: 12.7 % — ABNORMAL HIGH (ref 7.1–11.8)
Beta 2: 4.2 % (ref 3.2–6.5)
Beta Globulin: 6 % (ref 4.7–7.2)
Gamma Globulin: 16.7 % (ref 11.1–18.8)
IgA: 170 mg/dL (ref 68–379)
IgG (Immunoglobin G), Serum: 1100 mg/dL (ref 650–1600)
IgM, Serum: 19 mg/dL — ABNORMAL LOW (ref 41–251)
M-Spike, %: 0.65 g/dL
Total Protein: 6.2 g/dL (ref 6.0–8.3)

## 2012-12-06 ENCOUNTER — Telehealth: Payer: Self-pay | Admitting: Family Medicine

## 2012-12-06 MED ORDER — HYDROCODONE-ACETAMINOPHEN 5-325 MG PO TABS: 1.0000 | ORAL_TABLET | Freq: Four times a day (QID) | ORAL | Status: DC | PRN

## 2012-12-06 NOTE — Telephone Encounter (Signed)
Line Busy.

## 2012-12-06 NOTE — Telephone Encounter (Signed)
Pt states that he thinks the robaxin is making his tongue swell but he needs something to help with the pain. Is there a different kind of muscle relaxer that he could take?

## 2012-12-06 NOTE — Addendum Note (Signed)
Addended by: Legrand Rams B on: 12/06/2012 05:09 PM   Modules accepted: Orders

## 2012-12-06 NOTE — Telephone Encounter (Signed)
Per wtp pt needs to stop muscle relaxers and only take pain medication until we see if that is giving him the reaction. Rx printed and left up front and pt aware and will p/u in the morning

## 2012-12-06 NOTE — Telephone Encounter (Signed)
Patient aware.

## 2012-12-06 NOTE — Telephone Encounter (Signed)
Can they come by and get norco 5.325 q 6 hrs prn pain 30

## 2012-12-06 NOTE — Telephone Encounter (Signed)
Was in [place] last Monday OCt 27 d/c 10/29 hosp put him on percocet and now it makes his tongue feel thick and slurs his speech, wife is wanting something different called in for pain. They have stopped taking percocet as of yesterday.

## 2012-12-07 ENCOUNTER — Encounter: Payer: Self-pay | Admitting: Family Medicine

## 2012-12-07 ENCOUNTER — Ambulatory Visit (INDEPENDENT_AMBULATORY_CARE_PROVIDER_SITE_OTHER): Payer: Medicare Other | Admitting: Family Medicine

## 2012-12-07 VITALS — BP 114/84 | HR 96 | Temp 97.6°F | Resp 20 | Wt 262.0 lb

## 2012-12-07 DIAGNOSIS — M542 Cervicalgia: Secondary | ICD-10-CM

## 2012-12-07 DIAGNOSIS — M899 Disorder of bone, unspecified: Secondary | ICD-10-CM | POA: Diagnosis not present

## 2012-12-07 DIAGNOSIS — B370 Candidal stomatitis: Secondary | ICD-10-CM

## 2012-12-07 MED ORDER — NYSTATIN 100000 UNIT/ML MT SUSP: 5.0000 mL | Freq: Four times a day (QID) | OROMUCOSAL | Status: DC

## 2012-12-07 NOTE — Assessment & Plan Note (Signed)
Nystatin given 

## 2012-12-07 NOTE — Assessment & Plan Note (Signed)
CT of neck fairly unremarkable, per specialist thought to be due to skull lesion Hydrocodone given Advised to wear soft collar D/c robaxin Okay to continue the  salve

## 2012-12-07 NOTE — Patient Instructions (Addendum)
We will call you back with an appointment time with Neurosurgery Start the nystatin swish and swallow Continue pain meds Wear the soft collar  [facility]- Friday 11/7  1:30pm F/U Dr. Tanya Nones 6 weks

## 2012-12-07 NOTE — Progress Notes (Signed)
  Subjective:    Patient ID: Dean [name], male    DOB: 1935/09/30, 77 y.o.   MRN: [id number]  HPI Pt here to f/u hospital admission for neck pain. Over the past few weeks he had increasing left sided neck pain, Thought initially to be a "crook" in his neck. 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 CVA was ruled out. Started on robaxin, oncology consulted and extensive work up started including MM. CT of chest /abd/ and bone survey were fairly unremarkable, Pet Scan is still pending. Unfortunately he has not received f/u with oncology or neurosurgery. Per D/C CT guided biopsy is indicated. He continues to have neck pain, he has been using a salve OTC with menthol which helps, he is also using pain medication as needed. He has not been wearing the soft collar as recommended by Neurosurgery.  Also concerned about a while film and sore throat past week, was present while admitted. ? Robaxin made this worse therefore they have stopped this. No fever, SOB, cough, nasal drainage.    Review of Systems   GEN- denies fatigue, fever, weight loss,weakness, recent illness HEENT- denies eye drainage, change in vision, nasal discharge, CVS- denies chest pain, palpitations RESP- denies SOB, cough, wheeze ABD- denies N/V, change in stools, abd pain GU- denies dysuria, hematuria, dribbling, incontinence MSK- + joint pain, muscle aches, injury Neuro- denies headache, dizziness, syncope, seizure activity      Objective:   Physical Exam GEN- NAD, alert and oriented x3 HEENT- PERRL, EOMI, non injected sclera, pink conjunctiva, MMM, oropharynx clear, +thrush, mild injection posterior oropharynx Neck- Supple, stiff ROM, decrease lateral rotation and extension, no LAD CVS- RRR, no murmur RESP-CTAB EXT- No edema NEURO- CNII-XII intact, no  focal deficits, no slurred speech Pulses- Radial, DP- 2+        Assessment & Plan:

## 2012-12-07 NOTE — Assessment & Plan Note (Signed)
Concern for probable cancerous lesion, though primary has not been found I have arranged f/u with oncology this Friday Appt with neurosurgery still pending

## 2012-12-09 ENCOUNTER — Other Ambulatory Visit: Payer: Self-pay

## 2012-12-10 ENCOUNTER — Encounter (HOSPITAL_COMMUNITY): Payer: Medicare Other | Attending: Hematology and Oncology

## 2012-12-10 ENCOUNTER — Encounter (HOSPITAL_COMMUNITY): Payer: Self-pay

## 2012-12-10 VITALS — BP 155/84 | HR 82 | Temp 97.5°F | Resp 20 | Ht 68.0 in | Wt 264.0 lb

## 2012-12-10 DIAGNOSIS — M899 Disorder of bone, unspecified: Secondary | ICD-10-CM | POA: Diagnosis not present

## 2012-12-10 DIAGNOSIS — G589 Mononeuropathy, unspecified: Secondary | ICD-10-CM

## 2012-12-10 DIAGNOSIS — K219 Gastro-esophageal reflux disease without esophagitis: Secondary | ICD-10-CM | POA: Diagnosis not present

## 2012-12-10 DIAGNOSIS — B370 Candidal stomatitis: Secondary | ICD-10-CM

## 2012-12-10 DIAGNOSIS — G629 Polyneuropathy, unspecified: Secondary | ICD-10-CM

## 2012-12-10 NOTE — Progress Notes (Signed)
[facility]  OFFICE PROGRESS NOTE  Lynnea Ferrier [name], MD [address] [place] [address]  DIAGNOSIS: Skull lesion (Occipital condyle syndrome)  Neuropathy  GERD (gastroesophageal reflux disease)  Thrush  Chief Complaint  Patient presents with  . Occipital condyle syndrome    CURRENT THERAPY: None at this time.  INTERVAL HISTORY: Dean [name] 77 y.o. male returns for followup after recent hospitalization revealed evidence of a destructive process involving the left occipital condyle. Although advised to do so, the patient is now wearing a soft cervical collar. He was emphatically urged to do so in order to prevent hypoglossal nerve damage producing dysarthria and inability to control his tongue. 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 The pain became more intense associated with dizziness but no dysarthria. He is seeing his family physician a few days ago and was diagnosed with thrush for which he is using nystatin mouthwash. His had regular bowel movements with no rectal bleeding or difficulty in initiating urination. Appetite been good until just recently when he developed thrush. He denies any PND, orthopnea, palpitations, abdominal pain, but did have nausea recently again in conjunction with oral thrush.   MEDICAL HISTORY: Past Medical History  Diagnosis Date  . Neuropathy   . BPH (benign prostatic hyperplasia)   . GERD (gastroesophageal reflux disease)   . H/O ETOH abuse     INTERIM HISTORY: has Neuropathy; BPH (benign prostatic hyperplasia); GERD (gastroesophageal reflux disease); H/O ETOH abuse; Skull lesion; Thrush; and Neck pain on his problem list.    ALLERGIES:  is allergic to codeine.  MEDICATIONS: has a current medication list which includes the following  prescription(s): alprazolam, gabapentin, hydrocodone-acetaminophen, nystatin, tamsulosin, triamcinolone cream, methocarbamol, and naproxen sodium.  SURGICAL HISTORY: History reviewed. No pertinent past surgical history.  FAMILY HISTORY: family history includes Diabetes in his mother; Hypertension in his father; Stroke in his mother.  SOCIAL HISTORY:  reports that he has never smoked. He has never used smokeless tobacco. He reports that he drinks alcohol. He reports that he does not use illicit drugs.  REVIEW OF SYSTEMS:  Other than that discussed above is noncontributory.  PHYSICAL EXAMINATION: ECOG PERFORMANCE STATUS: 1 - Symptomatic but completely ambulatory  Blood pressure 155/84, pulse 82, temperature 97.5 F (36.4 C), temperature source Oral, resp. rate 20, height 5\' 8"  (1.727 m), weight 264 lb (119.75 kg).  GENERAL:alert, no distress and comfortable. No tenderness over the lower left occiput. SKIN: skin color, texture, turgor are normal, no rashes or significant lesions EYES: PERLA; Conjunctiva are pink and non-injected, sclera clear OROPHARYNX:no exudate, no erythema on lips, buccal mucosa, or tongue. NECK: supple, thyroid normal size, non-tender, without nodularity. No masses CHEST: Increased AP diameter with no breast masses. LYMPH:  no palpable lymphadenopathy in the cervical, axillary or inguinal LUNGS: clear to auscultation and percussion with normal breathing effort HEART: regular rate & rhythm and no murmurs. ABDOMEN:abdomen soft, non-tender and normal bowel sounds. Obese with no masses. Liver and spleen not enlarged. MUSCULOSKELETAL:no cyanosis of digits and no clubbing. Range of motion normal.  NEURO: alert & oriented x 3 with fluent speech, no focal motor/sensory deficits   LABORATORY DATA: Admission on 11/29/2012, Discharged on 12/01/2012  Component Date Value Range Status  . WBC 11/29/2012 8.5  4.0 - 10.5 K/uL Final  .  RBC 11/29/2012 4.61  4.22 - 5.81 MIL/uL Final    . Hemoglobin 11/29/2012 13.9  13.0 - 17.0 g/dL Final  . HCT 66/44/0347 40.8  39.0 - 52.0 % Final  . MCV 11/29/2012 88.5  78.0 - 100.0 fL Final  . MCH 11/29/2012 30.2  26.0 - 34.0 pg Final  . MCHC 11/29/2012 34.1  30.0 - 36.0 g/dL Final  . RDW 42/59/5638 13.2  11.5 - 15.5 % Final  . Platelets 11/29/2012 152  150 - 400 K/uL Final  . Neutrophils Relative % 11/29/2012 53  43 - 77 % Final  . Neutro Abs 11/29/2012 4.5  1.7 - 7.7 K/uL Final  . Lymphocytes Relative 11/29/2012 39  12 - 46 % Final  . Lymphs Abs 11/29/2012 3.3  0.7 - 4.0 K/uL Final  . Monocytes Relative 11/29/2012 8  3 - 12 % Final  . Monocytes Absolute 11/29/2012 0.7  0.1 - 1.0 K/uL Final  . Eosinophils Relative 11/29/2012 1  0 - 5 % Final  . Eosinophils Absolute 11/29/2012 0.1  0.0 - 0.7 K/uL Final  . Basophils Relative 11/29/2012 0  0 - 1 % Final  . Basophils Absolute 11/29/2012 0.0  0.0 - 0.1 K/uL Final  . Sodium 11/29/2012 141  135 - 145 mEq/L Final  . Potassium 11/29/2012 3.8  3.5 - 5.1 mEq/L Final  . Chloride 11/29/2012 108  96 - 112 mEq/L Final  . CO2 11/29/2012 24  19 - 32 mEq/L Final  . Glucose, Bld 11/29/2012 104* 70 - 99 mg/dL Final  . BUN 75/64/3329 20  6 - 23 mg/dL Final  . Creatinine, Ser 11/29/2012 0.82  0.50 - 1.35 mg/dL Final  . Calcium 51/88/4166 8.8  8.4 - 10.5 mg/dL Final  . GFR calc non Af Amer 11/29/2012 84* >90 mL/min Final  . GFR calc Af Amer 11/29/2012 >90  >90 mL/min Final   Comment: (NOTE)                          The eGFR has been calculated using the CKD EPI equation.                          This calculation has not been validated in all clinical situations.                          eGFR's persistently <90 mL/min signify possible Chronic Kidney                          Disease.  . Sed Rate 11/29/2012 5  0 - 16 mm/hr Final  . Color, Urine 11/29/2012 YELLOW  YELLOW Final  . APPearance 11/29/2012 CLEAR  CLEAR Final  . Specific Gravity, Urine 11/29/2012 1.010  1.005 - 1.030 Final  . pH 11/29/2012  5.5  5.0 - 8.0 Final  . Glucose, UA 11/29/2012 NEGATIVE  NEGATIVE mg/dL Final  . Hgb urine dipstick 11/29/2012 NEGATIVE  NEGATIVE Final  . Bilirubin Urine 11/29/2012 NEGATIVE  NEGATIVE Final  . Ketones, ur 11/29/2012 NEGATIVE  NEGATIVE mg/dL Final  . Protein, ur 08/03/1599 NEGATIVE  NEGATIVE mg/dL Final  . Urobilinogen, UA 11/29/2012 0.2  0.0 - 1.0 mg/dL Final  . Nitrite 09/32/3557 NEGATIVE  NEGATIVE Final  . Leukocytes, UA 11/29/2012 NEGATIVE  NEGATIVE Final   MICROSCOPIC NOT DONE ON URINES WITH NEGATIVE PROTEIN, BLOOD, LEUKOCYTES, NITRITE, OR GLUCOSE <  1000 mg/dL.  Marland Kitchen Hemoglobin A1C 11/29/2012 5.6  <5.7 % Final   Comment: (NOTE)                                                                                                                         According to the ADA Clinical Practice Recommendations for 2011, when                          HbA1c is used as a screening test:                           >=6.5%   Diagnostic of Diabetes Mellitus                                    (if abnormal result is confirmed)                          5.7-6.4%   Increased risk of developing Diabetes Mellitus                          References:Diagnosis and Classification of Diabetes Mellitus,Diabetes                          Care,2011,34(Suppl 1):S62-S69 and Standards of Medical Care in                                  Diabetes - 2011,Diabetes Care,2011,34 (Suppl 1):S11-S61.  . Mean Plasma Glucose 11/29/2012 114  <117 mg/dL Final   Performed at [facility]  . Cholesterol 11/30/2012 152  0 - 200 mg/dL Final  . Triglycerides 11/30/2012 100  <150 mg/dL Final  . HDL 16/11/9602 40  >39 mg/dL Final  . Total CHOL/HDL Ratio 11/30/2012 3.8   Final  . VLDL 11/30/2012 20  0 - 40 mg/dL Final  . LDL Cholesterol 11/30/2012 92  0 - 99 mg/dL Final   Comment:                                 Total Cholesterol/HDL:CHD Risk                          Coronary Heart Disease Risk Table                                               Men   Women  1/2 Average Risk   3.4   3.3                           Average Risk       5.0   4.4                           2 X Average Risk   9.6   7.1                           3 X Average Risk  23.4   11.0                                                          Use the calculated Patient Ratio                          above and the CHD Risk Table                          to determine the patient's CHD Risk.                                                          ATP III CLASSIFICATION (LDL):                           <100     mg/dL   Optimal                           100-129  mg/dL   Near or Above                                             Optimal                           130-159  mg/dL   Borderline                           160-189  mg/dL   High                           >190     mg/dL   Very High  . Opiates 11/30/2012 NONE DETECTED  NONE DETECTED Final  . Cocaine 11/30/2012 NONE DETECTED  NONE DETECTED Final  . Benzodiazepines 11/30/2012 NONE DETECTED  NONE DETECTED Final  . Amphetamines 11/30/2012 NONE DETECTED  NONE DETECTED Final  . Tetrahydrocannabinol 11/30/2012 NONE DETECTED  NONE DETECTED Final  . Barbiturates 11/30/2012 NONE DETECTED  NONE DETECTED Final   Comment:  DRUG SCREEN FOR MEDICAL PURPOSES                          ONLY.  IF CONFIRMATION IS NEEDED                          FOR ANY PURPOSE, NOTIFY LAB                          WITHIN 5 DAYS.                                                          LOWEST DETECTABLE LIMITS                          FOR URINE DRUG SCREEN                          Drug Class       Cutoff (ng/mL)                          Amphetamine      1000                          Barbiturate      200                          Benzodiazepine   200                          Tricyclics       300                          Opiates          300                          Cocaine           300                          THC              50  . Total Protein ELP 11/29/2012 6.0  6.0 - 8.3 g/dL Final  . Albumin ELP 47/82/9562 57.2  55.8 - 66.1 % Final  . Alpha-1-Globulin 11/29/2012 4.8  2.9 - 4.9 % Final  . Alpha-2-Globulin 11/29/2012 11.6  7.1 - 11.8 % Final  . Beta Globulin 11/29/2012 6.2  4.7 - 7.2 % Final  . Beta 2 11/29/2012 4.0  3.2 - 6.5 % Final  . Gamma Globulin 11/29/2012 16.2  11.1 - 18.8 % Final  . M-Spike, % 11/29/2012 0.63   Final  . SPE Interp. 11/29/2012 (NOTE)   Final   Comment: A restricted band consistent with monoclonal protein is present.                          Suggest serum IFE to confirm,  if clinically indicated.  (Lab will hold                          sample one week. Please call Customer Service at [phone] to add                          test.)                          The monoclonal protein peak accounts for 0.63 g/dL of the total                          0.97 g/dL of protein in the gamma region.                          Reviewed by Dallas Breeding, MD, PhD, FCAP (Electronic Signature on                          File)  . Comment 11/29/2012 (NOTE)   Final   Comment: ---------------                          Serum protein electrophoresis is a useful screening procedure in the                          detection of various pathophysiologic states such as inflammation,                          gammopathies, protein loss and other dysproteinemias.  Immunofixation                          electrophoresis (IFE) is a more sensitive technique for the                          identification of M-proteins found in patients with monoclonal                          gammopathy of unknown significance (MGUS), amyloidosis, early or                          treated myeloma or macroglobulinemia, solitary plasmacytoma or                          extramedullary plasmacytoma.                          Performed at [facility]  . Sodium 11/30/2012 142  135  - 145 mEq/L Final  . Potassium 11/30/2012 3.6  3.5 - 5.1 mEq/L Final  . Chloride 11/30/2012 108  96 - 112 mEq/L Final  . CO2 11/30/2012 26  19 - 32 mEq/L Final  . Glucose, Bld 11/30/2012 97  70 - 99 mg/dL Final  . BUN 98/12/9145 17  6 - 23 mg/dL Final  . Creatinine, Ser 11/30/2012 0.73  0.50 - 1.35 mg/dL Final  . Calcium 82/95/6213 8.5  8.4 - 10.5 mg/dL Final  . Total Protein 11/30/2012 6.1  6.0 - 8.3 g/dL Final  . Albumin 16/11/9602 3.0* 3.5 - 5.2 g/dL Final  . AST 54/10/8117 13  0 - 37 U/L Final  . ALT 11/30/2012 12  0 - 53 U/L Final  . Alkaline Phosphatase 11/30/2012 22* 39 - 117 U/L Final  . Total Bilirubin 11/30/2012 0.4  0.3 - 1.2 mg/dL Final  . GFR calc non Af Amer 11/30/2012 89* >90 mL/min Final  . GFR calc Af Amer 11/30/2012 >90  >90 mL/min Final   Comment: (NOTE)                          The eGFR has been calculated using the CKD EPI equation.                          This calculation has not been validated in all clinical situations.                          eGFR's persistently <90 mL/min signify possible Chronic Kidney                          Disease.  . Glucose-Capillary 11/30/2012 118* 70 - 99 mg/dL Final  . Comment 1 14/78/2956 Notify RN   Final  . Glucose-Capillary 11/30/2012 104* 70 - 99 mg/dL Final  . Comment 1 21/30/8657 Notify RN   Final  . WBC 12/01/2012 9.6  4.0 - 10.5 K/uL Final  . RBC 12/01/2012 4.62  4.22 - 5.81 MIL/uL Final  . Hemoglobin 12/01/2012 13.9  13.0 - 17.0 g/dL Final  . HCT 84/69/6295 41.0  39.0 - 52.0 % Final  . MCV 12/01/2012 88.7  78.0 - 100.0 fL Final  . MCH 12/01/2012 30.1  26.0 - 34.0 pg Final  . MCHC 12/01/2012 33.9  30.0 - 36.0 g/dL Final  . RDW 28/41/3244 13.0  11.5 - 15.5 % Final  . Platelets 12/01/2012 154  150 - 400 K/uL Final  . Sodium 12/01/2012 138  135 - 145 mEq/L Final  . Potassium 12/01/2012 3.5  3.5 - 5.1 mEq/L Final  . Chloride 12/01/2012 103  96 - 112 mEq/L Final  . CO2 12/01/2012 26  19 - 32 mEq/L Final  . Glucose, Bld  12/01/2012 113* 70 - 99 mg/dL Final  . BUN 02/05/7251 16  6 - 23 mg/dL Final  . Creatinine, Ser 12/01/2012 0.73  0.50 - 1.35 mg/dL Final  . Calcium 66/44/0347 8.4  8.4 - 10.5 mg/dL Final  . GFR calc non Af Amer 12/01/2012 89* >90 mL/min Final  . GFR calc Af Amer 12/01/2012 >90  >90 mL/min Final   Comment: (NOTE)                          The eGFR has been calculated using the CKD EPI equation.                          This calculation has not been validated in all clinical situations.                          eGFR's persistently <90 mL/min signify possible Chronic Kidney  Disease.  . Glucose-Capillary 11/30/2012 113* 70 - 99 mg/dL Final  . Comment 1 40/98/1191 Notify RN   Final  . Comment 2 11/30/2012 Documented in Chart   Final  . Glucose-Capillary 11/30/2012 108* 70 - 99 mg/dL Final  . Comment 1 47/82/9562 Notify RN   Final  . Comment 2 11/30/2012 Documented in Chart   Final  . Glucose-Capillary 12/01/2012 111* 70 - 99 mg/dL Final  . Comment 1 13/09/6576 Notify RN   Final  . ANA 12/01/2012 NEGATIVE  NEGATIVE Final   Performed at [facility]  . PSA 12/01/2012 3.01  <=4.00 ng/mL Final   Comment: (NOTE)                          Test Methodology: ECLIA PSA (Electrochemiluminescence Immunoassay)                          For PSA values from 2.5-4.0, particularly in younger men <60 years                          old, the AUA and NCCN suggest testing for % Free PSA (3515) and                          evaluation of the rate of increase in PSA (PSA velocity).                          Performed at [facility]  . CRP 12/01/2012 1.4* <0.60 mg/dL Final   Performed at [facility]  . Total Protein 12/01/2012 6.2  6.0 - 8.3 g/dL Final  . Albumin ELP 46/96/2952 54.9* 55.8 - 66.1 % Final  . Alpha-1-Globulin 12/01/2012 5.5* 2.9 - 4.9 % Final  . Alpha-2-Globulin 12/01/2012 12.7* 7.1 - 11.8 % Final  . Beta Globulin 12/01/2012 6.0  4.7 - 7.2 % Final  .  Beta 2 12/01/2012 4.2  3.2 - 6.5 % Final  . Gamma Globulin 12/01/2012 16.7  11.1 - 18.8 % Final  . M-Spike, % 12/01/2012 0.65   Final  . SPE Interp. 12/01/2012 (NOTE)   Final   Comment: A restricted band consistent with monoclonal protein is present.                          The monoclonal protein peak accounts for 0.65 g/dL of the total                          1.04 g/dL of protein in the gamma region.                          Results are consistent with SPE performed on 12/01/2012 Reviewed by                          Dallas Breeding, MD, PhD, FCAP (Electronic Signature on File)  . Comment 12/01/2012 (NOTE)   Final   Comment: ---------------                          Serum protein electrophoresis is a useful screening procedure in the  detection of various pathophysiologic states such as inflammation,                          gammopathies, protein loss and other dysproteinemias.  Immunofixation                          electrophoresis (IFE) is a more sensitive technique for the                          identification of M-proteins found in patients with monoclonal                          gammopathy of unknown significance (MGUS), amyloidosis, early or                          treated myeloma or macroglobulinemia, solitary plasmacytoma or                          extramedullary plasmacytoma.  . IgG (Immunoglobin G), Serum 12/01/2012 1100  650 - 1600 mg/dL Final  . IgA 16/11/9602 170  68 - 379 mg/dL Final  . IgM, Serum 54/10/8117 19* 41 - 251 mg/dL Final  . Immunofix Electr Int 12/01/2012 (NOTE)   Final   Comment: Monoclonal IgG kappa protein is present.                          Reviewed by Dallas Breeding, MD, PhD, FCAP (Electronic Signature on                          File)                          Performed at [facility]  . Beta-2 Microglobulin 12/01/2012 1.42  1.01 - 1.73 mg/L Final   Performed at [facility]  . Time 12/01/2012 24    Final  . Volume, Urine 12/01/2012 35   Corrected   CORRECTED ON 10/31 AT 1256: PREVIOUSLY REPORTED AS 35  . Total Protein, Urine 12/01/2012 1.4   Final   No established reference range.  . Total Protein, Urine-Ur/day 12/01/2012 0* 10 - 140 mg/day Final   Comment: (NOTE)                          Total urinary protein is determined by adding the albumin and Kappa                          and/or Lambda light chains.  This value may not agree with the total                          protein as determined by chemical methods, which characteristically                          underestimate urinary light chains.  . Albumin, U 12/01/2012 DETECTED  DETECTED Final  . Alpha 1, Urine 12/01/2012 DETECTED* NONE DETECTED Final  . Alpha 2, Urine 12/01/2012 DETECTED* NONE DETECTED Final  . Beta, Urine 12/01/2012 DETECTED* NONE DETECTED  Final  . Gamma Globulin, Urine 12/01/2012 DETECTED* NONE DETECTED Final  . Free Kappa Lt Chains,Ur 12/01/2012 0.82  0.14 - 2.42 mg/dL Final  . Free Lt Chn Excr Rate 12/01/2012 0.29   Final  . Free Lambda Lt Chains,Ur 12/01/2012 0.06  0.02 - 0.67 mg/dL Final  . Free Lambda Excretion/Day 12/01/2012 0.02   Final  . Free Kappa/Lambda Ratio 12/01/2012 13.67* 2.04 - 10.37 ratio Final   (NOTE)  . Immunofixation, Urine 12/01/2012 (NOTE)   Final   Comment: No monoclonal free light chains (Bence Jones Protein) are detected.                          Urine IFE shows polyclonal increase in free Kappa and/or free Lambda                          light chains.                          Reviewed by Dallas Breeding, MD, PhD, FCAP (Electronic Signature on                          File)                          Performed at [facility]  . Immunofixation, Urine 12/01/2012 (NOTE)   Final   Comment: Negative for monoclonal free light chains (Bence Jones protein).                          Reviewed by Dallas Breeding, MD, PhD, FCAP (Electronic Signature on                           File)                          Performed at [facility]  . Glucose-Capillary 12/01/2012 109* 70 - 99 mg/dL Final    PATHOLOGY:  Urinalysis    Component Value Date/Time   COLORURINE YELLOW 11/29/2012 2005   APPEARANCEUR CLEAR 11/29/2012 2005   LABSPEC 1.010 11/29/2012 2005   PHURINE 5.5 11/29/2012 2005   GLUCOSEU NEGATIVE 11/29/2012 2005   HGBUR NEGATIVE 11/29/2012 2005   BILIRUBINUR NEGATIVE 11/29/2012 2005   KETONESUR NEGATIVE 11/29/2012 2005   PROTEINUR NEGATIVE 11/29/2012 2005   UROBILINOGEN 0.2 11/29/2012 2005   NITRITE NEGATIVE 11/29/2012 2005   LEUKOCYTESUR NEGATIVE 11/29/2012 2005    RADIOGRAPHIC STUDIES: Ct Angio Head W/cm &/or Wo Cm  11/29/2012   CLINICAL DATA:  Left-sided neck pain for weeks; no documented weakness on exam.  EXAM: CT ANGIOGRAPHY HEAD AND NECK  TECHNIQUE: Multidetector CT imaging of the head and neck was performed using the standard protocol during bolus administration of intravenous contrast. Multiplanar CT image reconstructions including MIPs were obtained to evaluate the vascular anatomy. Carotid stenosis measurements (when applicable) are obtained utilizing NASCET criteria, using the distal internal carotid diameter as the denominator.  CONTRAST:  80mL OMNIPAQUE IOHEXOL 300 MG/ML  SOLN  COMPARISON:  CT head earlier in the day  FINDINGS: CTA HEAD FINDINGS  Non stenotic atheromatous calcification of the cavernous carotids. This extends to the supraclinoid ICA on the right. No flow-limiting stenosis is evident.  The  basilar artery is patent with both vertebrals contributing, right dominant. No flow-limiting stenosis or dissection.  There is no significant stenosis or occlusion of the anterior, middle, or posterior cerebral arteries. Major dural venous sinuses are patent. There is no intracranial aneurysm.  Mild atrophy with chronic microvascular ischemic change. A destructive lesion of the left occipital condyle is re demonstrated. Cross-sectional  measurements are approximately 22 x 16 x 14 mm. Slight intracranial extension medially without compression of the brainstem or cerebellum. No encroachment on the left internal carotid artery, vertebral artery, or internal jugular vein at this time. The lesion does not demonstrate avid contrast enhancement.  5 mm anterior 3rd ventricle lesion seen on noncontrast CT not clearly confirmed on this exam. Consider elective MRI brain without and with contrast for further evaluation.  No acute sinus or mastoid disease. Negative orbits. Hypertrophic change left mandibular condyle may represent an old fracture with callus.  Review of the MIP images confirms the above findings.  CTA NECK FINDINGS  Unremarkable transverse arch. Conventional anatomy of the great vessel origins without proximal stenosis. No lung apex lesion. No obvious neck masses. No flow reducing atheromatous change at the carotid bifurcations. No carotid dissection. Both vertebrals patent with the right vertebral dominant. No neck masses or visible adenopathy. Mild cervical spondylosis.  Review of the MIP images confirms the above findings.  IMPRESSION: CTA HEAD IMPRESSION  22 x 16 x 14 mm destructive lesion left occipital condyle without encroachment on the left internal carotid, left vertebral, or left internal jugular. The lesion appears insufficiently vascular and too inferior to strongly consider glomus tumor. Metastatic lesion would be the most likely consideration. Chordoma, schwannoma, or myeloma are less likely considerations.  The 5 mm hyperdense lesion seen on noncontrast CT is less evident on CTA. Significance is unclear.  No intracranial flow limiting stenosis or occlusion.  CTA NECK IMPRESSION  No extracranial stenosis or occlusion is evident. No regional adenopathy is obvious.   Electronically Signed   By: Davonna Belling M.D.   On: 11/29/2012 19:52   Dg Chest 2 View  11/29/2012   CLINICAL DATA:  Left-sided headache and neck pain  EXAM: CHEST   2 VIEW  COMPARISON:  None.  FINDINGS: The heart size and mediastinal contours are within normal limits. Both lungs are clear. The visualized skeletal structures are unremarkable. There is scoliosis of spine.  IMPRESSION: No active cardiopulmonary disease.   Electronically Signed   By: Sherian Rein M.D.   On: 11/29/2012 19:44   Dg Cervical Spine With Flex & Extend  12/01/2012   CLINICAL DATA:  Occipital condyle mass.  Assess for instability.  EXAM: CERVICAL SPINE COMPLETE WITH FLEXION AND EXTENSION VIEWS  COMPARISON:  None.  FINDINGS: The cervical spine is visualized from the skullbase through C7 on the lateral views. The craniocervical junction is incompletely visualized. And  The prevertebral soft tissues are normal. Multilevel endplate degenerative changes are present. Slight retrolisthesis at C3-4 is exaggerated and extension and reduced in flexion. This may be physiologic.  The upright images demonstrate slight leftward curvature.  The atlantooccipital joint appears to be intact. There is no instability. The lesion along the left occipital condyle and petrous bone is not visualized.  IMPRESSION: 1. Slight movements tissue with retrolisthesis at C3-4 is likely physiologic. 2. Multilevel endplate degenerative changes without other significant movement. 3. Stability of the atlantooccipital joint.   Electronically Signed   By: Gennette Pac M.D.   On: 12/01/2012 16:06   Ct Head Wo Contrast  11/29/2012   CLINICAL DATA:  Headache. Acute onset of aphasia.  EXAM: CT HEAD WITHOUT CONTRAST  TECHNIQUE: Contiguous axial images were obtained from the base of the skull through the vertex without intravenous contrast.  COMPARISON:  None.  FINDINGS: There is a 2.5 cm destructive lesion of the left occipital condyle immediately adjacent to the left internal carotid artery and jugular foramen.  There is no acute intracranial hemorrhage or acute infarct. There is mild diffuse atrophy. 5 mm colloid cyst in the anterior  aspect of the 3rd ventricle. No ventricular dilatation.  There is a deformity of the left mandibular angle which may be due to an osteoma or an old fracture.  IMPRESSION: 1. Destructive lesion of the skullbase involving the left occipital condyle and carotid and jugular foramina on the left. 2. No other acute abnormality. 3. 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   Electronically Signed   By: Geanie Cooley M.D.   On: 11/29/2012 18:52   Ct Angio Neck W/cm &/or Wo/cm  11/29/2012   CLINICAL DATA:  Left-sided neck pain for weeks; no documented weakness on exam.  EXAM: CT ANGIOGRAPHY HEAD AND NECK  TECHNIQUE: Multidetector CT imaging of the head and neck was performed using the standard protocol during bolus administration of intravenous contrast. Multiplanar CT image reconstructions including MIPs were obtained to evaluate the vascular anatomy. Carotid stenosis measurements (when applicable) are obtained utilizing NASCET criteria, using the distal internal carotid diameter as the denominator.  CONTRAST:  80mL OMNIPAQUE IOHEXOL 300 MG/ML  SOLN  COMPARISON:  CT head earlier in the day  FINDINGS: CTA HEAD FINDINGS  Non stenotic atheromatous calcification of the cavernous carotids. This extends to the supraclinoid ICA on the right. No flow-limiting stenosis is evident.  The basilar artery is patent with both vertebrals contributing, right dominant. No flow-limiting stenosis or dissection.  There is no significant stenosis or occlusion of the anterior, middle, or posterior cerebral arteries. Major dural venous sinuses are patent. There is no intracranial aneurysm.  Mild atrophy with chronic microvascular ischemic change. A destructive lesion of the left occipital condyle is re demonstrated. Cross-sectional measurements are approximately 22 x 16 x 14 mm. Slight intracranial extension medially without compression of the brainstem or cerebellum. No encroachment on the left internal carotid artery, vertebral artery, or  internal jugular vein at this time. The lesion does not demonstrate avid contrast enhancement.  5 mm anterior 3rd ventricle lesion seen on noncontrast CT not clearly confirmed on this exam. Consider elective MRI brain without and with contrast for further evaluation.  No acute sinus or mastoid disease. Negative orbits. Hypertrophic change left mandibular condyle may represent an old fracture with callus.  Review of the MIP images confirms the above findings.  CTA NECK FINDINGS  Unremarkable transverse arch. Conventional anatomy of the great vessel origins without proximal stenosis. No lung apex lesion. No obvious neck masses. No flow reducing atheromatous change at the carotid bifurcations. No carotid dissection. Both vertebrals patent with the right vertebral dominant. No neck masses or visible adenopathy. Mild cervical spondylosis.  Review of the MIP images confirms the above findings.  IMPRESSION: CTA HEAD IMPRESSION  22 x 16 x 14 mm destructive lesion left occipital condyle without encroachment on the left internal carotid, left vertebral, or left internal jugular. The lesion appears insufficiently vascular and too inferior to strongly consider glomus tumor. Metastatic lesion would be the most likely consideration. Chordoma, schwannoma, or myeloma are less likely considerations.  The 5 mm hyperdense lesion seen  on noncontrast CT is less evident on CTA. Significance is unclear.  No intracranial flow limiting stenosis or occlusion.  CTA NECK IMPRESSION  No extracranial stenosis or occlusion is evident. No regional adenopathy is obvious.   Electronically Signed   By: Davonna Belling M.D.   On: 11/29/2012 19:52   Ct Chest W Contrast  11/30/2012   CLINICAL DATA:  Skull base lesion. Evaluate for primary malignancy.  EXAM: CT CHEST, ABDOMEN, AND PELVIS WITH CONTRAST  TECHNIQUE: Multidetector CT imaging of the chest, abdomen and pelvis was performed following the standard protocol during bolus administration of  intravenous contrast.  CONTRAST:  OMNIPAQUE IOHEXOL 300 MG/ML  SOLN  COMPARISON:  Plain film chest of 11/29/2012. Brain MRI of 11/30/2012.  FINDINGS: CT CHEST FINDINGS  Lungs/Pleura: Mild motion degradation. This primarily is identified superiorly. Scattered areas of subpleural nodularity, likely foci of scarring. A calcified granuloma at the right lung base. Scattered calcified left upper lobe clustered nodules which are likely also related to old granulomatous disease. Right lower lobe 5 mm nodule on image 23/ series 3. Favored also to represent an area of scarring. No typical findings of primary malignancy within the chest.  No pleural fluid.  Heart/Mediastinum: No supraclavicular adenopathy. Tortuous thoracic aorta. Mild cardiomegaly with dense coronary artery atherosclerosis. No pericardial effusion. No central pulmonary embolism, on this non-dedicated study. No mediastinal or hilar adenopathy. Contrast filled, mildly dilated thoracic esophagus on image 28.  CT ABDOMEN AND PELVIS FINDINGS  Abdomen/Pelvis: Normal liver, spleen, stomach, pancreas, adrenal glands. Stone filled gallbladder. Bilateral renal cysts with left renal sinus cysts. Punctate left renal collecting system calculus. No retroperitoneal or retrocrural adenopathy. Extensive colonic diverticulosis. Normal terminal ileum and appendix. Normal small bowel without abdominal ascites.  No pelvic adenopathy. Contrast within the urinary bladder. Mild prostatomegaly. A tiny fat containing left inguinal hernia. No significant free fluid.  Bones/Musculoskeletal: Hemangioma within the T12 vertebral body. A lucent lesion within the left side of the T1 vertebral body is approximately 7 mm and too small to characterize. Favored also to represent a hemangioma.  IMPRESSION: CT CHEST IMPRESSION  1. No evidence of primary malignancy within the chest. Scattered tiny pulmonary nodules, primarily felt to represent areas of scarring and sequelae of old  granulomatous disease.  2.  Advanced coronary artery atherosclerosis.  3. Esophageal air fluid level suggests dysmotility or gastroesophageal reflux.  CT ABDOMEN AND PELVIS IMPRESSION  1. No primary malignancy identified within the abdomen or pelvis. 2. Cholelithiasis. 3. Left nephrolithiasis.   Electronically Signed   By: Jeronimo Greaves M.D.   On: 11/30/2012 17:14   Mr Maxine Glenn Head Wo Contrast  11/30/2012   CLINICAL DATA:  Left-sided weakness.  EXAM: MRA HEAD WITHOUT CONTRAST  TECHNIQUE: MRA HEAD WITHOUT CONTRAST  COMPARISON:  CTA head and neck 11/30/2012  FINDINGS: The internal carotid arteries are within normal limits from the high cervical segments through the ICA termini bilaterally. The A1 and M1 segments are normal. No definite anterior communicating artery is present. The MCA bifurcations are within normal limits. ACA and MCA branch vessels are within normal limits.  The right vertebral artery is the dominant vessel. Attenuation of the proximal left vertebral artery is artifactual based on the prior CTA. Signal loss is evident adjacent to the skull base tumor. The vertebrobasilar junction is within normal limits. Both posterior cerebral arteries originate from the basilar tip. There is some attenuation of distal PCA branch vessels.  IMPRESSION: 1. Mild distal small vessel disease. 2. Signal attenuation of  the proximal left vertebral artery. This may be artifactual adjacent to the skull base tumor. The left vertebral artery appeared normal on the CTA.   Electronically Signed   By: Gennette Pac M.D.   On: 11/30/2012 10:10   Mri Brain Without Contrast  11/30/2012   CLINICAL DATA:  Left-sided weakness.  Abnormal CTA neck and head.  EXAM: MRI HEAD WITHOUT CONTRAST  TECHNIQUE: Multiplanar, multisequence MR imaging was performed. No intravenous contrast was administered.  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 This is at the level of the left  hypoglossal canal and below. The lesion is slightly T2 hyperintense and intermediate density on T1 weighted images. The lesion is heterogeneous with some restricted diffusion. The bone is slightly expanded. The tumor appears to be confined to the bone the  No acute infarct or hemorrhage is present. There is no parenchymal mass lesion. Atrophy and white matter disease is somewhat advanced for age. Flow is present in the major intracranial arteries. The globes and orbits are intact. The paranasal sinuses and mastoid air cells are clear.  No definite tongue atrophy is present.  IMPRESSION: 1. 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 Please correlate with any known primary tumor. No other skull lesions are evident. The a paraganglioma is still considered. This is not typical of a schwannoma. Chordoma is also considered less likely. 2. No acute parenchymal abnormality. 3. Atrophy and white matter disease is advanced for age. This likely reflects the sequelae of chronic microvascular ischemia.   Electronically Signed   By: Gennette Pac M.D.   On: 11/30/2012 10:24   Ct Abdomen Pelvis W Contrast  11/30/2012   CLINICAL DATA:  Skull base lesion. Evaluate for primary malignancy.  EXAM: CT CHEST, ABDOMEN, AND PELVIS WITH CONTRAST  TECHNIQUE: Multidetector CT imaging of the chest, abdomen and pelvis was performed following the standard protocol during bolus administration of intravenous contrast.  CONTRAST:  OMNIPAQUE IOHEXOL 300 MG/ML  SOLN  COMPARISON:  Plain film chest of 11/29/2012. Brain MRI of 11/30/2012.  FINDINGS: CT CHEST FINDINGS  Lungs/Pleura: Mild motion degradation. This primarily is identified superiorly. Scattered areas of subpleural nodularity, likely foci of scarring. A calcified granuloma at the right lung base. Scattered calcified left upper lobe clustered nodules which are  likely also related to old granulomatous disease. Right lower lobe 5 mm nodule on image 23/ series 3. Favored also to represent an area of scarring. No typical findings of primary malignancy within the chest.  No pleural fluid.  Heart/Mediastinum: No supraclavicular adenopathy. Tortuous thoracic aorta. Mild cardiomegaly with dense coronary artery atherosclerosis. No pericardial effusion. No central pulmonary embolism, on this non-dedicated study. No mediastinal or hilar adenopathy. Contrast filled, mildly dilated thoracic esophagus on image 28.  CT ABDOMEN AND PELVIS FINDINGS  Abdomen/Pelvis: Normal liver, spleen, stomach, pancreas, adrenal glands. Stone filled gallbladder. Bilateral renal cysts with left renal sinus cysts. Punctate left renal collecting system calculus. No retroperitoneal or retrocrural adenopathy. Extensive colonic diverticulosis. Normal terminal ileum and appendix. Normal small bowel without abdominal ascites.  No pelvic adenopathy. Contrast within the urinary bladder. Mild prostatomegaly. A tiny fat containing left inguinal hernia. No significant free fluid.  Bones/Musculoskeletal: Hemangioma within the T12 vertebral body. A lucent lesion within the left side of the T1 vertebral body is approximately 7 mm  and too small to characterize. Favored also to represent a hemangioma.  IMPRESSION: CT CHEST IMPRESSION  1. No evidence of primary malignancy within the chest. Scattered tiny pulmonary nodules, primarily felt to represent areas of scarring and sequelae of old granulomatous disease.  2.  Advanced coronary artery atherosclerosis.  3. Esophageal air fluid level suggests dysmotility or gastroesophageal reflux.  CT ABDOMEN AND PELVIS IMPRESSION  1. No primary malignancy identified within the abdomen or pelvis. 2. Cholelithiasis. 3. Left nephrolithiasis.   Electronically Signed   By: Jeronimo Greaves M.D.   On: 11/30/2012 17:14   US Carotid Duplex Bilateral  11/30/2012   CLINICAL DATA:   Cerebrovascular accident.  EXAM: BILATERAL CAROTID DUPLEX ULTRASOUND  TECHNIQUE: Wallace Cullens scale imaging, color Doppler and duplex ultrasound were performed of bilateral carotid and vertebral arteries in the neck.  COMPARISON:  None.  FINDINGS: Criteria: Quantification of carotid stenosis is based on velocity parameters that correlate the residual internal carotid diameter with NASCET-based stenosis levels, using the diameter of the distal internal carotid lumen as the denominator for stenosis measurement.  The following velocity measurements were obtained:  RIGHT  ICA:  101/15 cm/sec  CCA:  104/19 cm/sec  SYSTOLIC ICA/CCA RATIO:  0.97  DIASTOLIC ICA/CCA RATIO:  0.81  ECA:  98 cm/sec  LEFT  ICA:  72/19 cm/sec  CCA:  101/19 cm/sec  SYSTOLIC ICA/CCA RATIO:  0.72  DIASTOLIC ICA/CCA RATIO:  0.99  ECA:  92 cm/sec  RIGHT CAROTID ARTERY: Minimal plaque formation is noted in the distal right common carotid artery and proximal right internal carotid artery consistent with less than 50% diameter stenosis based on Doppler criteria.  RIGHT VERTEBRAL ARTERY:  Antegrade flow is noted.  LEFT CAROTID ARTERY: Minimal plaque formation is noted in the distal left common carotid artery and proximal left internal carotid artery consistent with less than 50% diameter stenosis based on Doppler criteria.  LEFT VERTEBRAL ARTERY:  Antegrade flow is noted.  IMPRESSION: Minimal plaque formation is seen in both proximal internal carotid arteries consistent with less than 50% diameter stenosis based on Doppler criteria.   Electronically Signed   By: Roque Lias M.D.   On: 11/30/2012 09:07   Dg Bone Survey Met  12/01/2012   CLINICAL DATA:  Occipital condyle mass. Evaluate for multiple myeloma lesions.  EXAM: METASTATIC BONE SURVEY  COMPARISON:  CT chest abdomen pelvis 11/30/2012.  FINDINGS: AP and lateral cervical spine views are included in a dedicated exam performed the same day. Frontal view of the chest shows midline trachea and normal heart  size. Lungs are clear. No pleural fluid.  No definite lucent lesions. The frontal view of the lumbar spine is degraded by motion. Mild superior endplate compression involving the L3 vertebral body, as on yesterday's exam. Multilevel spondylosis.  IMPRESSION: 1. No lytic lesions to indicate multiple myeloma. 2. AP and lateral views of the cervical spine are included in the dedicated cervical spine series performed the same day. 3. L3 superior endplate compression fracture, as on 11/30/2012.   Electronically Signed   By: Leanna Battles M.D.   On: 12/01/2012 15:34    ASSESSMENT:  #1. 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 #2. History of chronic alcoholism, abstaining since one year. #3. Benign prostatic hypertrophy. #4. Gastroesophageal reflux disease.    PLAN:  #1. Neurosurgical appointment on 12/14/2012. #2. Radiotherapy appointment at [facility] on the afternoon of 12/14/2012 of possible #3. Recent evaluations  by interventional radiology indicate that attempting to biopsy the area of involvement in the occipital condyle would be dangerous. #4. Would recommend radiation treatment without a tissue diagnosis and noted to prevent potential hypoglossal nerve damage. #5. The patient was encouraged in the presence of his wife and daughter to wear a cervical collar at all times, including to bed. #6. Followup in 3 weeks.   All questions were answered. The patient knows to call the clinic with any problems, questions or concerns. We can certainly see the patient much sooner if necessary.   I spent 25 minutes counseling the patient face to face. The total time spent in the appointment was 30 minutes.    Maurilio Lovely, MD 12/10/2012 2:01 PM

## 2012-12-10 NOTE — Patient Instructions (Signed)
[facility] Discharge Instructions  RECOMMENDATIONS MADE BY THE CONSULTANT AND ANY TEST RESULTS WILL BE SENT TO YOUR REFERRING PHYSICIAN.  PLEASE WEAR YOUR CERVICAL COLLAR,THIS IS VERY IMPORTANT FOR YOUR CONDITION.  We will make a referral to Radiation Oncology at [facility]. We will try to get you an appointment there next week. Return to this clinic in 3 weeks.  Thank you for choosing [facility] to provide your oncology and hematology care.  To afford each patient quality time with our providers, please arrive at least 15 minutes before your scheduled appointment time.  With your help, our goal is to use those 15 minutes to complete the necessary work-up to ensure our physicians have the information they need to help with your evaluation and healthcare recommendations.    Effective January 1st, 2014, we ask that you re-schedule your appointment with our physicians should you arrive 10 or more minutes late for your appointment.  We strive to give you quality time with our providers, and arriving late affects you and other patients whose appointments are after yours.    Again, thank you for choosing [facility].  Our hope is that these requests will decrease the amount of time that you wait before being seen by our physicians.       _____________________________________________________________  Should you have questions after your visit to [facility], please contact our office at [phone] between the hours of 8:30 a.m. and 5:00 p.m.  Voicemails left after 4:30 p.m. will not be returned until the following business day.  For prescription refill requests, have your pharmacy contact our office with your prescription refill request.

## 2012-12-13 ENCOUNTER — Encounter: Payer: Self-pay | Admitting: Radiation Oncology

## 2012-12-13 NOTE — Progress Notes (Signed)
Histology and Location of Primary Cancer: unknown  Sites of Visceral and Bony Metastatic Disease: occipital condyle syndrome  Location(s) of Symptomatic Metastases: occipital condyle syndrome  Past/Anticipated chemotherapy by medical oncology, if any:   Pain on a scale of 0-10 is:    If Spine Met(s), symptoms, if any, include:  Bowel/Bladder retention or incontinence (please describe):   Numbness or weakness in extremities (please describe):   Current Decadron regimen, if applicable:  Ambulatory status? Walker? Wheelchair?:   SAFETY ISSUES:  Prior radiation? no  Pacemaker/ICD? no  Possible current pregnancy? No  Is the patient on methotrexate? No  Current Complaints / other details:  Patient wears a soft cervical collar.  Recently had thrush.

## 2012-12-14 ENCOUNTER — Other Ambulatory Visit: Payer: Self-pay | Admitting: Neurosurgery

## 2012-12-14 ENCOUNTER — Telehealth: Payer: Self-pay | Admitting: *Deleted

## 2012-12-14 ENCOUNTER — Telehealth: Payer: Self-pay | Admitting: Oncology

## 2012-12-14 DIAGNOSIS — C9000 Multiple myeloma not having achieved remission: Secondary | ICD-10-CM | POA: Diagnosis not present

## 2012-12-14 NOTE — Telephone Encounter (Signed)
Received a voice mail message from Dean [name], Dean [name]'s daughter.  She is wondering if he should postpone his consult appointment tomorrow with Dr. Roselind Messier because he is having a new MRI on 11/24.  She asked to be called back at [phone].

## 2012-12-14 NOTE — Telephone Encounter (Signed)
CALLED PATIENT TO INFORM THAT [place] FOR 12-15-12 HAS BEEN MOVED TO 12-29-12- ARRIVAL TIME - 9:30 AM, SPOKE WITH PATIENT'S WIFE AND THEY ARE AWARE OF THIS CHANGE AND THEY ARE GOOD WITH IT.

## 2012-12-15 ENCOUNTER — Ambulatory Visit
Admission: RE | Admit: 2012-12-15 | Discharge: 2012-12-15 | Disposition: A | Discharge status: Home / Self Care | Payer: Medicare Other | Source: Ambulatory Visit | Attending: Radiation Oncology | Admitting: Radiation Oncology

## 2012-12-15 ENCOUNTER — Ambulatory Visit: Payer: Medicare Other

## 2012-12-15 HISTORY — DX: Disorder of bone, unspecified: M89.9

## 2012-12-27 ENCOUNTER — Ambulatory Visit
Admission: RE | Admit: 2012-12-27 | Discharge: 2012-12-27 | Disposition: A | Discharge status: Home / Self Care | Payer: Medicare Other | Source: Ambulatory Visit | Attending: Neurosurgery | Admitting: Neurosurgery

## 2012-12-27 DIAGNOSIS — C9000 Multiple myeloma not having achieved remission: Secondary | ICD-10-CM

## 2012-12-27 DIAGNOSIS — M47812 Spondylosis without myelopathy or radiculopathy, cervical region: Secondary | ICD-10-CM | POA: Diagnosis not present

## 2012-12-27 MED ORDER — GADOBENATE DIMEGLUMINE 529 MG/ML IV SOLN
20.0000 mL | Freq: Once | INTRAVENOUS | Status: AC | PRN
  Administered 2012-12-27: 20 mL via INTRAVENOUS

## 2012-12-29 ENCOUNTER — Ambulatory Visit
Admission: RE | Admit: 2012-12-29 | Discharge: 2012-12-29 | Disposition: A | Discharge status: Home / Self Care | Payer: Medicare Other | Source: Ambulatory Visit | Attending: Radiation Oncology | Admitting: Radiation Oncology

## 2012-12-29 ENCOUNTER — Telehealth: Payer: Self-pay | Admitting: Oncology

## 2012-12-29 ENCOUNTER — Encounter: Payer: Self-pay | Admitting: Radiation Oncology

## 2012-12-29 VITALS — BP 151/81 | HR 75 | Temp 97.5°F | Ht 68.0 in | Wt 267.6 lb

## 2012-12-29 DIAGNOSIS — M899 Disorder of bone, unspecified: Secondary | ICD-10-CM

## 2012-12-29 DIAGNOSIS — K148 Other diseases of tongue: Secondary | ICD-10-CM | POA: Diagnosis not present

## 2012-12-29 DIAGNOSIS — M47812 Spondylosis without myelopathy or radiculopathy, cervical region: Secondary | ICD-10-CM | POA: Insufficient documentation

## 2012-12-29 DIAGNOSIS — C7951 Secondary malignant neoplasm of bone: Secondary | ICD-10-CM | POA: Diagnosis not present

## 2012-12-29 DIAGNOSIS — N400 Enlarged prostate without lower urinary tract symptoms: Secondary | ICD-10-CM | POA: Diagnosis not present

## 2012-12-29 DIAGNOSIS — Z79899 Other long term (current) drug therapy: Secondary | ICD-10-CM | POA: Insufficient documentation

## 2012-12-29 DIAGNOSIS — M542 Cervicalgia: Secondary | ICD-10-CM | POA: Diagnosis not present

## 2012-12-29 DIAGNOSIS — R5381 Other malaise: Secondary | ICD-10-CM | POA: Diagnosis not present

## 2012-12-29 DIAGNOSIS — K219 Gastro-esophageal reflux disease without esophagitis: Secondary | ICD-10-CM | POA: Insufficient documentation

## 2012-12-29 DIAGNOSIS — G589 Mononeuropathy, unspecified: Secondary | ICD-10-CM | POA: Insufficient documentation

## 2012-12-29 DIAGNOSIS — Z510 Encounter for antineoplastic radiation therapy: Secondary | ICD-10-CM | POA: Insufficient documentation

## 2012-12-29 MED ORDER — MORPHINE SULFATE 15 MG PO TABS: 15.0000 mg | ORAL_TABLET | ORAL | Status: DC | PRN

## 2012-12-29 MED ORDER — MORPHINE SULFATE 4 MG/ML IJ SOLN
4.0000 mg | Freq: Once | INTRAMUSCULAR | Status: AC
  Administered 2012-12-29: 4 mg via INTRAMUSCULAR
  Filled 2012-12-29: qty 1

## 2012-12-29 MED ORDER — DEXAMETHASONE 4 MG PO TABS: 4.0000 mg | ORAL_TABLET | Freq: Two times a day (BID) | ORAL | Status: DC

## 2012-12-29 NOTE — Progress Notes (Signed)
Patient states that pain is now at a 5/10 in his left neck.

## 2012-12-29 NOTE — Telephone Encounter (Signed)
Received a call from Jackson, Coffield Mollenkopf's wife.  She is asking if Dr. Roselind Messier is going to call in a prescription for thrush and if so, will it interfere with the decadron.  Advised I would ask Dr. Roselind Messier and call her back.

## 2012-12-29 NOTE — Telephone Encounter (Signed)
Called Dean [name] back to let her know that Dr. Roselind Messier did not see any thrush on exam so he will not be calling in anything now.

## 2012-12-29 NOTE — Progress Notes (Signed)
Please see the Nurse Progress Note in the MD Initial Consult Encounter for this patient. 

## 2012-12-29 NOTE — Progress Notes (Signed)
Histology and Location of Primary Cancer: unknown   Sites of Visceral and Bony Metastatic Disease: occipital condyle syndrome   Location(s) of Symptomatic Metastases: occipital condyle syndrome   Past/Anticipated chemotherapy by medical oncology, if any:   Pain on a scale of 0-10 is: 8 in the back of his left neck that is radiating up behind his ear and around his hair line.   If Spine Met(s), symptoms, if any, include:  Bowel/Bladder retention or incontinence (please describe): none Numbness or weakness in extremities (please describe): numbness in right upper leg Current Decadron regimen, if applicable: none  Ambulatory status? Walker? Wheelchair?: walks independently  SAFETY ISSUES:  Prior radiation? no  Pacemaker/ICD? no  Possible current pregnancy? No   Is the patient on methotrexate? No   Current Complaints / other details:  Patient wears a soft cervical collar.  Recently had thrush.  Patient here with his daughter.  He is married.  He needs a refill on his pain medication.  He is wondering if there is another medication he can try because the hydrocodone gives him a headache.  He has been trying to loose weight and has lost 40 lbs over the last eight months.  He also reports that he can't move his tongue to the left side of his mouth.

## 2012-12-29 NOTE — Progress Notes (Signed)
Radiation Oncology         [phone] ________________________________  Initial outpatient Consultation  Name: Cross Jorge MRN: [id number]  Date: 12/29/2012  DOB: 02-02-1936  YN:WGNFAOZ,HYQMVH [name], MD  Alla German, MD ,Donalee Citrin, MD  REFERRING PHYSICIAN: Alla German, MD  DIAGNOSIS: The encounter diagnosis was Skull lesion.  HISTORY OF PRESENT ILLNESS::Dean [name] Maynes is a 77 y.o. male who is seen out of the courtesy of Dr.  Zigmund Daniel for an opinion concerning radiation therapy as part of the management of the patient's painful left occipital condyle infiltrative lesion. The patient presented to the [facility] Emergency department October 27 with left-sided headache and neck pain. Scan at that time revealed a lytic lesion within the left occipital condyle. Since that time the patient is been seen by neurosurgery as well as medical oncology. The patient has undergone extensive workup with no conclusive evidence as to the etiology of this lesion. Patient was initially felt to have multiple myeloma but bone survey, blood and urine test did not confirm this diagnosis. I reviewed the patient's lesion with interventional radiology today.  this area is too dangerous to consider a biopsy. Patient did undergo a cervical MRI ordered by neurosurgery which showed a possible second lesion in the left side of T1 vertebral but upon further review today in radiology this was felt to be a benign process and biopsy this area would not be recommended and this also dangerous area to biopsy.  The patient continues to have significant pain in his left side of his neck and in which radiates into the top of his left ear and left skull area. All this consistent with occipital neuralgia.   in addition the patient has difficulty chewing food and pills as  his left tongue is not mechanically work well. He denies any obvious numbness along his face or tongue area. With this information the patient is now seen  to be considered for radiation without a tissue diagnosis.  PREVIOUS RADIATION THERAPY: No  PAST MEDICAL HISTORY:  has a past medical history of Neuropathy; BPH (benign prostatic hyperplasia); GERD (gastroesophageal reflux disease); H/O ETOH abuse; and Skull lesion.    PAST SURGICAL HISTORY: Past Surgical History  Procedure Laterality Date  . Cyst excision  1959    tail bone    FAMILY HISTORY: family history includes Diabetes in his mother; Hypertension in his father; Stroke in his mother.  SOCIAL HISTORY:  reports that he has never smoked. He has never used smokeless tobacco. He reports that he does not drink alcohol or use illicit drugs.  ALLERGIES: Codeine  MEDICATIONS:  Current Outpatient Prescriptions  Medication Sig Dispense Refill  . ALPRAZolam (XANAX) 1 MG tablet Take 1 mg by mouth 2 (two) times daily as needed. For anxiety and/or sleep      . aspirin EC 325 MG tablet Take 325 mg by mouth every 4 (four) hours as needed.      Marland Kitchen dexamethasone (DECADRON) 4 MG tablet Take 1 tablet (4 mg total) by mouth 2 (two) times daily with a meal.  30 tablet  1  . gabapentin (NEURONTIN) 400 MG capsule Take 400 mg by mouth 4 (four) times daily. May take up to five times daily depending on pain in feet      . HYDROcodone-acetaminophen (NORCO/VICODIN) 5-325 MG per tablet Take 1 tablet by mouth every 6 (six) hours as needed for pain.  30 tablet  0  . morphine (MSIR) 15 MG tablet Take 1 tablet (  15 mg total) by mouth every 4 (four) hours as needed for severe pain.  30 tablet  0  . naproxen sodium (ALEVE) 220 MG tablet Take 440 mg by mouth once as needed (for pain).      . nystatin (MYCOSTATIN) 100000 UNIT/ML suspension Take 5 mLs (500,000 Units total) by mouth 4 (four) times daily.  120 mL  0  . tamsulosin (FLOMAX) 0.4 MG CAPS capsule Take 0.4 mg by mouth every evening.      . triamcinolone cream (KENALOG) 0.1 % Apply 1 application topically 2 (two) times daily.       No current  facility-administered medications for this encounter.    REVIEW OF SYSTEMS:  A 15 point review of systems is documented in the electronic medical record. This was obtained by the nursing staff. However, I reviewed this with the patient to discuss relevant findings and make appropriate changes.  Constant pain along the left side of the neck which will radiate into the left ureter and left skull area. He denies any other areas of pain. Difficulties with eating as above   PHYSICAL EXAM: Vital signs are recorded and reviewed in the patient's nursing assessment  General Appearance:    Alert, cooperative, no distress, appears stated age, accompanied by her daughter on evaluation today   Head:    Normocephalic, without obvious abnormality, atraumatic, some discomfort with palpation along the left occipital skull region   Eyes:    PERRL, conjunctiva/corneas clear, EOM's intact,        Ears:    Normal TM's and external ear canals, both ears  Nose:   Nares normal, septum midline, mucosa normal, no drainage    or sinus tenderness  Throat:   Lips, mucosa, and tongue asymmetry, deviates to the patient's left;  gums normal, the left tonsil is quite prominent and on palpation has a firm area centrally   Neck:   Supple, symmetrical, trachea midline, no adenopathy;       thyroid:  No enlargement/tenderness/nodules; no carotid   bruit or JVD  Back:     Symmetric, no curvature, ROM normal, no CVA tenderness  Lungs:     Clear to auscultation bilaterally, respirations unlabored  Chest wall:    No tenderness or deformity  Heart:    Regular rate and rhythm, S1 and S2 normal, no murmur, rub   or gallop  Abdomen:     Soft, non-tender, bowel sounds active all four quadrants,    no masses, no organomegaly        Extremities:   Extremities normal, atraumatic, no cyanosis or edema  Pulses:   2+ and symmetric all extremities  Skin:   Skin color, texture, turgor normal, no rashes or lesions  Lymph nodes:   Cervical,  supraclavicular, and axillary nodes normal  Neurologic:   CNII-XII intact. Normal strength, sensation and reflexes      throughout    ECOG = 2   LABORATORY DATA:  Lab Results  Component Value Date   WBC 9.6 12/01/2012   HGB 13.9 12/01/2012   HCT 41.0 12/01/2012   MCV 88.7 12/01/2012   PLT 154 12/01/2012   Lab Results  Component Value Date   NA 138 12/01/2012   K 3.5 12/01/2012   CL 103 12/01/2012   CO2 26 12/01/2012   Lab Results  Component Value Date   ALT 12 11/30/2012   AST 13 11/30/2012   ALKPHOS 22* 11/30/2012   BILITOT 0.4 11/30/2012     RADIOGRAPHY: Ct  Angio Head W/cm &/or Wo Cm  11/29/2012   CLINICAL DATA:  Left-sided neck pain for weeks; no documented weakness on exam.  EXAM: CT ANGIOGRAPHY HEAD AND NECK  TECHNIQUE: Multidetector CT imaging of the head and neck was performed using the standard protocol during bolus administration of intravenous contrast. Multiplanar CT image reconstructions including MIPs were obtained to evaluate the vascular anatomy. Carotid stenosis measurements (when applicable) are obtained utilizing NASCET criteria, using the distal internal carotid diameter as the denominator.  CONTRAST:  80mL OMNIPAQUE IOHEXOL 300 MG/ML  SOLN  COMPARISON:  CT head earlier in the day  FINDINGS: CTA HEAD FINDINGS  Non stenotic atheromatous calcification of the cavernous carotids. This extends to the supraclinoid ICA on the right. No flow-limiting stenosis is evident.  The basilar artery is patent with both vertebrals contributing, right dominant. No flow-limiting stenosis or dissection.  There is no significant stenosis or occlusion of the anterior, middle, or posterior cerebral arteries. Major dural venous sinuses are patent. There is no intracranial aneurysm.  Mild atrophy with chronic microvascular ischemic change. A destructive lesion of the left occipital condyle is re demonstrated. Cross-sectional measurements are approximately 22 x 16 x 14 mm. Slight  intracranial extension medially without compression of the brainstem or cerebellum. No encroachment on the left internal carotid artery, vertebral artery, or internal jugular vein at this time. The lesion does not demonstrate avid contrast enhancement.  5 mm anterior 3rd ventricle lesion seen on noncontrast CT not clearly confirmed on this exam. Consider elective MRI brain without and with contrast for further evaluation.  No acute sinus or mastoid disease. Negative orbits. Hypertrophic change left mandibular condyle may represent an old fracture with callus.  Review of the MIP images confirms the above findings.  CTA NECK FINDINGS  Unremarkable transverse arch. Conventional anatomy of the great vessel origins without proximal stenosis. No lung apex lesion. No obvious neck masses. No flow reducing atheromatous change at the carotid bifurcations. No carotid dissection. Both vertebrals patent with the right vertebral dominant. No neck masses or visible adenopathy. Mild cervical spondylosis.  Review of the MIP images confirms the above findings.  IMPRESSION: CTA HEAD IMPRESSION  22 x 16 x 14 mm destructive lesion left occipital condyle without encroachment on the left internal carotid, left vertebral, or left internal jugular. The lesion appears insufficiently vascular and too inferior to strongly consider glomus tumor. Metastatic lesion would be the most likely consideration. Chordoma, schwannoma, or myeloma are less likely considerations.  The 5 mm hyperdense lesion seen on noncontrast CT is less evident on CTA. Significance is unclear.  No intracranial flow limiting stenosis or occlusion.  CTA NECK IMPRESSION  No extracranial stenosis or occlusion is evident. No regional adenopathy is obvious.   Electronically Signed   By: Davonna Belling M.D.   On: 11/29/2012 19:52   Dg Chest 2 View  11/29/2012   CLINICAL DATA:  Left-sided headache and neck pain  EXAM: CHEST  2 VIEW  COMPARISON:  None.  FINDINGS: The heart size  and mediastinal contours are within normal limits. Both lungs are clear. The visualized skeletal structures are unremarkable. There is scoliosis of spine.  IMPRESSION: No active cardiopulmonary disease.   Electronically Signed   By: Sherian Rein M.D.   On: 11/29/2012 19:44   Dg Cervical Spine With Flex & Extend  12/01/2012   CLINICAL DATA:  Occipital condyle mass.  Assess for instability.  EXAM: CERVICAL SPINE COMPLETE WITH FLEXION AND EXTENSION VIEWS  COMPARISON:  None.  FINDINGS:  The cervical spine is visualized from the skullbase through C7 on the lateral views. The craniocervical junction is incompletely visualized. And  The prevertebral soft tissues are normal. Multilevel endplate degenerative changes are present. Slight retrolisthesis at C3-4 is exaggerated and extension and reduced in flexion. This may be physiologic.  The upright images demonstrate slight leftward curvature.  The atlantooccipital joint appears to be intact. There is no instability. The lesion along the left occipital condyle and petrous bone is not visualized.  IMPRESSION: 1. Slight movements tissue with retrolisthesis at C3-4 is likely physiologic. 2. Multilevel endplate degenerative changes without other significant movement. 3. Stability of the atlantooccipital joint.   Electronically Signed   By: Gennette Pac M.D.   On: 12/01/2012 16:06   Ct Head Wo Contrast  11/29/2012   CLINICAL DATA:  Headache. Acute onset of aphasia.  EXAM: CT HEAD WITHOUT CONTRAST  TECHNIQUE: Contiguous axial images were obtained from the base of the skull through the vertex without intravenous contrast.  COMPARISON:  None.  FINDINGS: There is a 2.5 cm destructive lesion of the left occipital condyle immediately adjacent to the left internal carotid artery and jugular foramen.  There is no acute intracranial hemorrhage or acute infarct. There is mild diffuse atrophy. 5 mm colloid cyst in the anterior aspect of the 3rd ventricle. No ventricular dilatation.   There is a deformity of the left mandibular angle which may be due to an osteoma or an old fracture.  IMPRESSION: 1. Destructive lesion of the skullbase involving the left occipital condyle and carotid and jugular foramina on the left. 2. No other acute abnormality. 3. 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   Electronically Signed   By: Geanie Cooley M.D.   On: 11/29/2012 18:52   Ct Angio Neck W/cm &/or Wo/cm  11/29/2012   CLINICAL DATA:  Left-sided neck pain for weeks; no documented weakness on exam.  EXAM: CT ANGIOGRAPHY HEAD AND NECK  TECHNIQUE: Multidetector CT imaging of the head and neck was performed using the standard protocol during bolus administration of intravenous contrast. Multiplanar CT image reconstructions including MIPs were obtained to evaluate the vascular anatomy. Carotid stenosis measurements (when applicable) are obtained utilizing NASCET criteria, using the distal internal carotid diameter as the denominator.  CONTRAST:  80mL OMNIPAQUE IOHEXOL 300 MG/ML  SOLN  COMPARISON:  CT head earlier in the day  FINDINGS: CTA HEAD FINDINGS  Non stenotic atheromatous calcification of the cavernous carotids. This extends to the supraclinoid ICA on the right. No flow-limiting stenosis is evident.  The basilar artery is patent with both vertebrals contributing, right dominant. No flow-limiting stenosis or dissection.  There is no significant stenosis or occlusion of the anterior, middle, or posterior cerebral arteries. Major dural venous sinuses are patent. There is no intracranial aneurysm.  Mild atrophy with chronic microvascular ischemic change. A destructive lesion of the left occipital condyle is re demonstrated. Cross-sectional measurements are approximately 22 x 16 x 14 mm. Slight intracranial extension medially without compression of the brainstem or cerebellum. No encroachment on the left internal carotid artery, vertebral artery, or internal jugular vein at this time. The lesion does not  demonstrate avid contrast enhancement.  5 mm anterior 3rd ventricle lesion seen on noncontrast CT not clearly confirmed on this exam. Consider elective MRI brain without and with contrast for further evaluation.  No acute sinus or mastoid disease. Negative orbits. Hypertrophic change left mandibular condyle may represent an old fracture with callus.  Review of the MIP images confirms  the above findings.  CTA NECK FINDINGS  Unremarkable transverse arch. Conventional anatomy of the great vessel origins without proximal stenosis. No lung apex lesion. No obvious neck masses. No flow reducing atheromatous change at the carotid bifurcations. No carotid dissection. Both vertebrals patent with the right vertebral dominant. No neck masses or visible adenopathy. Mild cervical spondylosis.  Review of the MIP images confirms the above findings.  IMPRESSION: CTA HEAD IMPRESSION  22 x 16 x 14 mm destructive lesion left occipital condyle without encroachment on the left internal carotid, left vertebral, or left internal jugular. The lesion appears insufficiently vascular and too inferior to strongly consider glomus tumor. Metastatic lesion would be the most likely consideration. Chordoma, schwannoma, or myeloma are less likely considerations.  The 5 mm hyperdense lesion seen on noncontrast CT is less evident on CTA. Significance is unclear.  No intracranial flow limiting stenosis or occlusion.  CTA NECK IMPRESSION  No extracranial stenosis or occlusion is evident. No regional adenopathy is obvious.   Electronically Signed   By: Davonna Belling M.D.   On: 11/29/2012 19:52   Ct Chest W Contrast  11/30/2012   CLINICAL DATA:  Skull base lesion. Evaluate for primary malignancy.  EXAM: CT CHEST, ABDOMEN, AND PELVIS WITH CONTRAST  TECHNIQUE: Multidetector CT imaging of the chest, abdomen and pelvis was performed following the standard protocol during bolus administration of intravenous contrast.  CONTRAST:  OMNIPAQUE IOHEXOL 300  MG/ML  SOLN  COMPARISON:  Plain film chest of 11/29/2012. Brain MRI of 11/30/2012.  FINDINGS: CT CHEST FINDINGS  Lungs/Pleura: Mild motion degradation. This primarily is identified superiorly. Scattered areas of subpleural nodularity, likely foci of scarring. A calcified granuloma at the right lung base. Scattered calcified left upper lobe clustered nodules which are likely also related to old granulomatous disease. Right lower lobe 5 mm nodule on image 23/ series 3. Favored also to represent an area of scarring. No typical findings of primary malignancy within the chest.  No pleural fluid.  Heart/Mediastinum: No supraclavicular adenopathy. Tortuous thoracic aorta. Mild cardiomegaly with dense coronary artery atherosclerosis. No pericardial effusion. No central pulmonary embolism, on this non-dedicated study. No mediastinal or hilar adenopathy. Contrast filled, mildly dilated thoracic esophagus on image 28.  CT ABDOMEN AND PELVIS FINDINGS  Abdomen/Pelvis: Normal liver, spleen, stomach, pancreas, adrenal glands. Stone filled gallbladder. Bilateral renal cysts with left renal sinus cysts. Punctate left renal collecting system calculus. No retroperitoneal or retrocrural adenopathy. Extensive colonic diverticulosis. Normal terminal ileum and appendix. Normal small bowel without abdominal ascites.  No pelvic adenopathy. Contrast within the urinary bladder. Mild prostatomegaly. A tiny fat containing left inguinal hernia. No significant free fluid.  Bones/Musculoskeletal: Hemangioma within the T12 vertebral body. A lucent lesion within the left side of the T1 vertebral body is approximately 7 mm and too small to characterize. Favored also to represent a hemangioma.  IMPRESSION: CT CHEST IMPRESSION  1. No evidence of primary malignancy within the chest. Scattered tiny pulmonary nodules, primarily felt to represent areas of scarring and sequelae of old granulomatous disease.  2.  Advanced coronary artery atherosclerosis.  3.  Esophageal air fluid level suggests dysmotility or gastroesophageal reflux.  CT ABDOMEN AND PELVIS IMPRESSION  1. No primary malignancy identified within the abdomen or pelvis. 2. Cholelithiasis. 3. Left nephrolithiasis.   Electronically Signed   By: Jeronimo Greaves M.D.   On: 11/30/2012 17:14   Mr Maxine Glenn Head Wo Contrast  11/30/2012   CLINICAL DATA:  Left-sided weakness.  EXAM: MRA HEAD WITHOUT CONTRAST  TECHNIQUE: MRA HEAD WITHOUT CONTRAST  COMPARISON:  CTA head and neck 11/30/2012  FINDINGS: The internal carotid arteries are within normal limits from the high cervical segments through the ICA termini bilaterally. The A1 and M1 segments are normal. No definite anterior communicating artery is present. The MCA bifurcations are within normal limits. ACA and MCA branch vessels are within normal limits.  The right vertebral artery is the dominant vessel. Attenuation of the proximal left vertebral artery is artifactual based on the prior CTA. Signal loss is evident adjacent to the skull base tumor. The vertebrobasilar junction is within normal limits. Both posterior cerebral arteries originate from the basilar tip. There is some attenuation of distal PCA branch vessels.  IMPRESSION: 1. Mild distal small vessel disease. 2. Signal attenuation of the proximal left vertebral artery. This may be artifactual adjacent to the skull base tumor. The left vertebral artery appeared normal on the CTA.   Electronically Signed   By: Gennette Pac M.D.   On: 11/30/2012 10:10   Mri Brain Without Contrast  11/30/2012   CLINICAL DATA:  Left-sided weakness.  Abnormal CTA neck and head.  EXAM: MRI HEAD WITHOUT CONTRAST  TECHNIQUE: Multiplanar, multisequence MR imaging was performed. No intravenous contrast was administered.  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 This is at the level of the left hypoglossal canal and below. The lesion is slightly T2 hyperintense and  intermediate density on T1 weighted images. The lesion is heterogeneous with some restricted diffusion. The bone is slightly expanded. The tumor appears to be confined to the bone the  No acute infarct or hemorrhage is present. There is no parenchymal mass lesion. Atrophy and white matter disease is somewhat advanced for age. Flow is present in the major intracranial arteries. The globes and orbits are intact. The paranasal sinuses and mastoid air cells are clear.  No definite tongue atrophy is present.  IMPRESSION: 1. 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 Please correlate with any known primary tumor. No other skull lesions are evident. The a paraganglioma is still considered. This is not typical of a schwannoma. Chordoma is also considered less likely. 2. No acute parenchymal abnormality. 3. Atrophy and white matter disease is advanced for age. This likely reflects the sequelae of chronic microvascular ischemia.   Electronically Signed   By: Gennette Pac M.D.   On: 11/30/2012 10:24   Mr Cervical Spine W Wo Contrast  12/27/2012   CLINICAL DATA:  Neck pain. Severe left-sided neck pain for 8 weeks. History of multiple myeloma.  EXAM: MRI CERVICAL SPINE WITHOUT AND WITH CONTRAST  TECHNIQUE: Multiplanar and multiecho pulse sequences of the cervical spine, to include the craniocervical junction and cervicothoracic junction, were obtained according to standard protocol without and with intravenous contrast.  CONTRAST:  20mL MULTIHANCE GADOBENATE DIMEGLUMINE 529 MG/ML IV SOLN  BUN and creatinine were obtained on site at [facility] at  [address]  Results:  BUN 15 mg/dL,  Creatinine 0.8 mg/dL.  COMPARISON:  None.  FINDINGS: Study is technically degraded by motion artifact. There is a levoconvex torticollis with the apex at C6. Chronic appearing T1 superior endplate  compression fracture is present. In the left side of the T1 vertebra, there is a heterogeneous T2 hyperintense enhancing lesion which may represent a lesion associated  with multiple myeloma but is indeterminate. The left occipital condyle lesion is again noted with effacement of normal fatty marrow and post gadolinium enhancement, seen on sagittal imaging.  There are no other lesions suspicious for myeloma in the cervical spine. Multilevel cervical disc desiccation and degeneration is present. Cervical cord is within normal limits. No intra medullary lesion or cervical cord compression.  C2-C3:  Mild bilateral facet arthrosis.  No stenosis.  C3-C4: Central disc protrusion produces mild central stenosis, contacting and flattening the ventral cord. Right greater than left mild foraminal stenosis associated with both uncovertebral spurring and facet arthrosis.  C4-C5: Shallow right eccentric disc osteophyte complex is present. This just contacts the ventral aspect of the cord. There is right foraminal stenosis associated with facet arthrosis and uncovertebral spurring.  C5-C6: Mild central stenosis associated with shallow disc osteophyte complex. Mild right foraminal encroachment secondary to facet arthrosis and uncovertebral spurring. Left foramen appears patent.  C6-C7: Bilateral foraminal stenosis associated with uncovertebral spurring and facet arthrosis. Evaluation degraded by motion artifact. Central canal appears patent despite a shallow disc osteophyte complex and posterior ligamentum flavum redundancy.  C7-T1: Small central disc protrusion. Central canal is patent. Foramina appear adequately patent bilaterally.  T1-T2: Disc desiccation. Sagittal imaging only. There is no extraosseous extension of the T1 left-sided vertebral body lesion.  IMPRESSION: Moderate multilevel cervical spondylosis and facet arthrosis, detailed above. No pathologic fracture or epidural hematoma. No cord compression in this patient with  a history of myeloma. Left occipital condyle infiltrative lesion appears unchanged compared to recent brain MRI. 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   Electronically Signed   By: Andreas Newport M.D.   On: 12/27/2012 15:20   Ct Abdomen Pelvis W Contrast US Carotid Duplex Bilateral  11/30/2012   CLINICAL DATA:  Cerebrovascular accident.  EXAM: BILATERAL CAROTID DUPLEX ULTRASOUND  TECHNIQUE: Wallace Cullens scale imaging, color Doppler and duplex ultrasound were performed of bilateral carotid and vertebral arteries in the neck.  COMPARISON:  None.  FINDINGS: Criteria: Quantification of carotid stenosis is based on velocity parameters that correlate the residual internal carotid diameter with NASCET-based stenosis levels, using the diameter of the distal internal carotid lumen as the denominator for stenosis measurement.  The following velocity measurements were obtained:  RIGHT  ICA:  101/15 cm/sec  CCA:  104/19 cm/sec  SYSTOLIC ICA/CCA RATIO:  0.97  DIASTOLIC ICA/CCA RATIO:  0.81  ECA:  98 cm/sec  LEFT  ICA:  72/19 cm/sec  CCA:  101/19 cm/sec  SYSTOLIC ICA/CCA RATIO:  0.72  DIASTOLIC ICA/CCA RATIO:  0.99  ECA:  92 cm/sec  RIGHT CAROTID ARTERY: Minimal plaque formation is noted in the distal right common carotid artery and proximal right internal carotid artery consistent with less than 50% diameter stenosis based on Doppler criteria.  RIGHT VERTEBRAL ARTERY:  Antegrade flow is noted.  LEFT CAROTID ARTERY: Minimal plaque formation is noted in the distal left common carotid artery and proximal left internal carotid artery consistent with less than 50% diameter stenosis based on Doppler criteria.  LEFT VERTEBRAL ARTERY:  Antegrade flow is noted.  IMPRESSION: Minimal plaque formation is seen in both proximal internal carotid arteries consistent with less than 50% diameter stenosis based on Doppler criteria.   Electronically Signed   By:  Roque Lias M.D.   On: 11/30/2012 09:07   Dg Bone Survey Met  12/01/2012   CLINICAL DATA:  Occipital condyle mass. Evaluate for multiple  myeloma lesions.  EXAM: METASTATIC BONE SURVEY  COMPARISON:  CT chest abdomen pelvis 11/30/2012.  FINDINGS: AP and lateral cervical spine views are included in a dedicated exam performed the same day. Frontal view of the chest shows midline trachea and normal heart size. Lungs are clear. No pleural fluid.  No definite lucent lesions. The frontal view of the lumbar spine is degraded by motion. Mild superior endplate compression involving the L3 vertebral body, as on yesterday's exam. Multilevel spondylosis.  IMPRESSION: 1. No lytic lesions to indicate multiple myeloma. 2. AP and lateral views of the cervical spine are included in the dedicated cervical spine series performed the same day. 3. L3 superior endplate compression fracture, as on 11/30/2012.   Electronically Signed   By: Leanna Battles M.D.   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 This area is too risky to consider biopsy and there no other obvious sites on extensive systemic evaluation to identify a primary site. On clinical exam today the patient's left tonsil is somewhat concerning and I will consider scheduling a PET scan or ENT referral and will look carefully at this area with our treatment planning CT scan.  In the meantime the patient is significantly symptomatic from his skull base lesion. I discussed consideration for treatment without tissue diagnosis and the patient understands the implications and risks of treating without tissue diagnosis. Given the significant level of pain he is willing to accept treatment without firm tissue diagnosis.  PLAN: Simulation and planning later today. The patient did receive IM morphine today which helped his symptoms. I have given him a prescription for oral morphine to use. I've also placed the patient on Decadron 4 mg  twice daily to see if this will help with his symptoms and to avoid significant swelling once he starts his radiation therapy next week.  Will monitor  for redevelopment of thrush. I spent 60 minutes minutes face to face with the patient and more than 50% of that time was spent in counseling and/or coordination of care.   ------------------------------------------------  -----------------------------------  Billie Lade, PhD, MD

## 2012-12-29 NOTE — Progress Notes (Signed)
Dean [name] currently having his CT SIM.  Will continue to monitor.

## 2012-12-29 NOTE — Progress Notes (Signed)
Patient having pain at a 8/10 in his neck/head.  He is waiting for CT SIM.  Gave 4 mg of morphine IM in his left deltoid.  Morphine was witnessed by Liborio Nixon, [employer] fundraiser.  Will continue to monitor.

## 2013-01-03 ENCOUNTER — Encounter (HOSPITAL_COMMUNITY): Payer: Self-pay

## 2013-01-03 ENCOUNTER — Encounter (HOSPITAL_COMMUNITY): Payer: Medicare Other | Attending: Hematology and Oncology

## 2013-01-03 VITALS — BP 147/84 | HR 85 | Temp 98.0°F | Resp 20 | Wt 264.7 lb

## 2013-01-03 DIAGNOSIS — M899 Disorder of bone, unspecified: Secondary | ICD-10-CM | POA: Diagnosis not present

## 2013-01-03 DIAGNOSIS — N400 Enlarged prostate without lower urinary tract symptoms: Secondary | ICD-10-CM

## 2013-01-03 DIAGNOSIS — K219 Gastro-esophageal reflux disease without esophagitis: Secondary | ICD-10-CM

## 2013-01-03 MED ORDER — TEMAZEPAM 15 MG PO CAPS: 15.0000 mg | ORAL_CAPSULE | Freq: Every evening | ORAL | Status: DC | PRN

## 2013-01-03 NOTE — Progress Notes (Signed)
[facility]  OFFICE PROGRESS NOTE  Leo Grosser, MD [address] [place] [address]  DIAGNOSIS: occipital condyle syndrome  Chief Complaint  Patient presents with  . occipital condyle syndrome    CURRENT THERAPY: dexamethasone 4 mg twice a day per radiation oncology  INTERVAL HISTORY: Dean [name] 77 y.o. 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 He was seen in consultation by radiation oncology with plans to initiate treatment to the left occipital condyle on 01/05/2013 and also to the T1 area of lytic process. He is having difficulty sleeping because of steroids. He denies any nausea, vomiting, abdominal pain, fever, night sweats, lower extremity swelling or redness, PND, orthopnea, palpitations, or seizures. He is using a cervical collar 24 hours per day.  MEDICAL HISTORY: Past Medical History  Diagnosis Date  . Neuropathy   . BPH (benign prostatic hyperplasia)   . GERD (gastroesophageal reflux disease)   . H/O ETOH abuse   . Skull lesion     INTERIM HISTORY: has Neuropathy; BPH (benign prostatic hyperplasia); GERD (gastroesophageal reflux disease); H/O ETOH abuse; Skull lesion; Thrush; and Neck pain on his problem list.    ALLERGIES:  is allergic to codeine.  MEDICATIONS: has a current medication list which includes the following prescription(s): alprazolam, aspirin ec, dexamethasone, gabapentin, hydrocodone-acetaminophen, morphine, tamsulosin, triamcinolone cream, naproxen sodium, and nystatin.  SURGICAL HISTORY:  Past Surgical History  Procedure Laterality Date  . Cyst excision  1959    tail bone    FAMILY HISTORY: family history includes Diabetes in his mother; Hypertension in his father; Stroke in his mother.  SOCIAL HISTORY:  reports that he has never smoked. He has never used smokeless tobacco. He reports  that he does not drink alcohol or use illicit drugs.  REVIEW OF SYSTEMS:  Other than that discussed above is noncontributory.  PHYSICAL EXAMINATION: ECOG PERFORMANCE STATUS: 1 - Symptomatic but completely ambulatory  Blood pressure 147/84, pulse 85, temperature 98 F (36.7 C), temperature source Oral, resp. rate 20, weight 264 lb 11.2 oz (120.067 kg).  GENERAL:alert, no distress and comfortable SKIN: skin color, texture, turgor are normal, no rashes or significant lesions EYES: PERLA; Conjunctiva are pink and non-injected, sclera clear OROPHARYNX:no exudate, no erythema on lips, buccal mucosa, or tongue.no evidence of damage to left side of tongue. No further evidence of candidiasis. NECK: supple, thyroid normal size, non-tender, without nodularity. No masses CHEST: increased AP diameter with no gynecomastia. LYMPH:  no palpable lymphadenopathy in the cervical, axillary or inguinal LUNGS: clear to auscultation and percussion with normal breathing effort HEART: regular rate & rhythm and no murmurs. ABDOMEN:abdomen soft, non-tender and normal bowel sounds MUSCULOSKELETAL:no cyanosis of digits and no clubbing. Range of motion normal.  NEURO: alert & oriented x 3 with fluent speech, no focal motor/sensory deficits   LABORATORY DATA: No visits with results within 30 Day(s) from this visit. Latest known visit with results is:  Admission on 11/29/2012, Discharged on 12/01/2012  Component Date Value Range Status  . WBC 11/29/2012 8.5  4.0 - 10.5 K/uL Final  . RBC 11/29/2012 4.61  4.22 - 5.81 MIL/uL Final  . Hemoglobin 11/29/2012 13.9  13.0 - 17.0 g/dL Final  . HCT 60/45/4098 40.8  39.0 - 52.0 % Final  . MCV 11/29/2012 88.5  78.0 - 100.0 fL Final  . MCH 11/29/2012 30.2  26.0 -  34.0 pg Final  . MCHC 11/29/2012 34.1  30.0 - 36.0 g/dL Final  . RDW 56/21/3086 13.2  11.5 - 15.5 % Final  . Platelets 11/29/2012 152  150 - 400 K/uL Final  . Neutrophils Relative % 11/29/2012 53  43 - 77 % Final    . Neutro Abs 11/29/2012 4.5  1.7 - 7.7 K/uL Final  . Lymphocytes Relative 11/29/2012 39  12 - 46 % Final  . Lymphs Abs 11/29/2012 3.3  0.7 - 4.0 K/uL Final  . Monocytes Relative 11/29/2012 8  3 - 12 % Final  . Monocytes Absolute 11/29/2012 0.7  0.1 - 1.0 K/uL Final  . Eosinophils Relative 11/29/2012 1  0 - 5 % Final  . Eosinophils Absolute 11/29/2012 0.1  0.0 - 0.7 K/uL Final  . Basophils Relative 11/29/2012 0  0 - 1 % Final  . Basophils Absolute 11/29/2012 0.0  0.0 - 0.1 K/uL Final  . Sodium 11/29/2012 141  135 - 145 mEq/L Final  . Potassium 11/29/2012 3.8  3.5 - 5.1 mEq/L Final  . Chloride 11/29/2012 108  96 - 112 mEq/L Final  . CO2 11/29/2012 24  19 - 32 mEq/L Final  . Glucose, Bld 11/29/2012 104* 70 - 99 mg/dL Final  . BUN 57/84/6962 20  6 - 23 mg/dL Final  . Creatinine, Ser 11/29/2012 0.82  0.50 - 1.35 mg/dL Final  . Calcium 95/28/4132 8.8  8.4 - 10.5 mg/dL Final  . GFR calc non Af Amer 11/29/2012 84* >90 mL/min Final  . GFR calc Af Amer 11/29/2012 >90  >90 mL/min Final   Comment: (NOTE)                          The eGFR has been calculated using the CKD EPI equation.                          This calculation has not been validated in all clinical situations.                          eGFR's persistently <90 mL/min signify possible Chronic Kidney                          Disease.  . Sed Rate 11/29/2012 5  0 - 16 mm/hr Final  . Color, Urine 11/29/2012 YELLOW  YELLOW Final  . APPearance 11/29/2012 CLEAR  CLEAR Final  . Specific Gravity, Urine 11/29/2012 1.010  1.005 - 1.030 Final  . pH 11/29/2012 5.5  5.0 - 8.0 Final  . Glucose, UA 11/29/2012 NEGATIVE  NEGATIVE mg/dL Final  . Hgb urine dipstick 11/29/2012 NEGATIVE  NEGATIVE Final  . Bilirubin Urine 11/29/2012 NEGATIVE  NEGATIVE Final  . Ketones, ur 11/29/2012 NEGATIVE  NEGATIVE mg/dL Final  . Protein, ur 44/02/270 NEGATIVE  NEGATIVE mg/dL Final  . Urobilinogen, UA 11/29/2012 0.2  0.0 - 1.0 mg/dL Final  . Nitrite 53/66/4403  NEGATIVE  NEGATIVE Final  . Leukocytes, UA 11/29/2012 NEGATIVE  NEGATIVE Final   MICROSCOPIC NOT DONE ON URINES WITH NEGATIVE PROTEIN, BLOOD, LEUKOCYTES, NITRITE, OR GLUCOSE <1000 mg/dL.  Marland Kitchen Hemoglobin A1C 11/29/2012 5.6  <5.7 % Final   Comment: (NOTE)  According to the ADA Clinical Practice Recommendations for 2011, when                          HbA1c is used as a screening test:                           >=6.5%   Diagnostic of Diabetes Mellitus                                    (if abnormal result is confirmed)                          5.7-6.4%   Increased risk of developing Diabetes Mellitus                          References:Diagnosis and Classification of Diabetes Mellitus,Diabetes                          Care,2011,34(Suppl 1):S62-S69 and Standards of Medical Care in                                  Diabetes - 2011,Diabetes Care,2011,34 (Suppl 1):S11-S61.  . Mean Plasma Glucose 11/29/2012 114  <117 mg/dL Final   Performed at [facility]  . Cholesterol 11/30/2012 152  0 - 200 mg/dL Final  . Triglycerides 11/30/2012 100  <150 mg/dL Final  . HDL 16/11/9602 40  >39 mg/dL Final  . Total CHOL/HDL Ratio 11/30/2012 3.8   Final  . VLDL 11/30/2012 20  0 - 40 mg/dL Final  . LDL Cholesterol 11/30/2012 92  0 - 99 mg/dL Final   Comment:                                 Total Cholesterol/HDL:CHD Risk                          Coronary Heart Disease Risk Table                                              Men   Women                           1/2 Average Risk   3.4   3.3                           Average Risk       5.0   4.4                           2 X Average Risk   9.6   7.1                           3 X Average Risk  23.4   11.0  Use the calculated Patient Ratio                          above and the CHD Risk  Table                          to determine the patient's CHD Risk.                                                          ATP III CLASSIFICATION (LDL):                           <100     mg/dL   Optimal                           100-129  mg/dL   Near or Above                                             Optimal                           130-159  mg/dL   Borderline                           160-189  mg/dL   High                           >190     mg/dL   Very High  . Opiates 11/30/2012 NONE DETECTED  NONE DETECTED Final  . Cocaine 11/30/2012 NONE DETECTED  NONE DETECTED Final  . Benzodiazepines 11/30/2012 NONE DETECTED  NONE DETECTED Final  . Amphetamines 11/30/2012 NONE DETECTED  NONE DETECTED Final  . Tetrahydrocannabinol 11/30/2012 NONE DETECTED  NONE DETECTED Final  . Barbiturates 11/30/2012 NONE DETECTED  NONE DETECTED Final   Comment:                                 DRUG SCREEN FOR MEDICAL PURPOSES                          ONLY.  IF CONFIRMATION IS NEEDED                          FOR ANY PURPOSE, NOTIFY LAB                          WITHIN 5 DAYS.                                                          LOWEST DETECTABLE LIMITS  FOR URINE DRUG SCREEN                          Drug Class       Cutoff (ng/mL)                          Amphetamine      1000                          Barbiturate      200                          Benzodiazepine   200                          Tricyclics       300                          Opiates          300                          Cocaine          300                          THC              50  . Total Protein ELP 11/29/2012 6.0  6.0 - 8.3 g/dL Final  . Albumin ELP 09/81/1914 57.2  55.8 - 66.1 % Final  . Alpha-1-Globulin 11/29/2012 4.8  2.9 - 4.9 % Final  . Alpha-2-Globulin 11/29/2012 11.6  7.1 - 11.8 % Final  . Beta Globulin 11/29/2012 6.2  4.7 - 7.2 % Final  . Beta 2 11/29/2012 4.0  3.2 - 6.5 % Final  . Gamma Globulin  11/29/2012 16.2  11.1 - 18.8 % Final  . M-Spike, % 11/29/2012 0.63   Final  . SPE Interp. 11/29/2012 (NOTE)   Final   Comment: A restricted band consistent with monoclonal protein is present.                          Suggest serum IFE to confirm, if clinically indicated.  (Lab will hold                          sample one week. Please call Customer Service at [phone] to add                          test.)                          The monoclonal protein peak accounts for 0.63 g/dL of the total                          0.97 g/dL of protein in the gamma region.                          Reviewed by Dallas Breeding, MD, PhD, FCAP (Electronic Signature on  File)  . Comment 11/29/2012 (NOTE)   Final   Comment: ---------------                          Serum protein electrophoresis is a useful screening procedure in the                          detection of various pathophysiologic states such as inflammation,                          gammopathies, protein loss and other dysproteinemias.  Immunofixation                          electrophoresis (IFE) is a more sensitive technique for the                          identification of M-proteins found in patients with monoclonal                          gammopathy of unknown significance (MGUS), amyloidosis, early or                          treated myeloma or macroglobulinemia, solitary plasmacytoma or                          extramedullary plasmacytoma.                          Performed at [facility]  . Sodium 11/30/2012 142  135 - 145 mEq/L Final  . Potassium 11/30/2012 3.6  3.5 - 5.1 mEq/L Final  . Chloride 11/30/2012 108  96 - 112 mEq/L Final  . CO2 11/30/2012 26  19 - 32 mEq/L Final  . Glucose, Bld 11/30/2012 97  70 - 99 mg/dL Final  . BUN 16/11/9602 17  6 - 23 mg/dL Final  . Creatinine, Ser 11/30/2012 0.73  0.50 - 1.35 mg/dL Final  . Calcium 54/10/8117 8.5  8.4 - 10.5 mg/dL Final  . Total Protein  11/30/2012 6.1  6.0 - 8.3 g/dL Final  . Albumin 14/78/2956 3.0* 3.5 - 5.2 g/dL Final  . AST 21/30/8657 13  0 - 37 U/L Final  . ALT 11/30/2012 12  0 - 53 U/L Final  . Alkaline Phosphatase 11/30/2012 22* 39 - 117 U/L Final  . Total Bilirubin 11/30/2012 0.4  0.3 - 1.2 mg/dL Final  . GFR calc non Af Amer 11/30/2012 89* >90 mL/min Final  . GFR calc Af Amer 11/30/2012 >90  >90 mL/min Final   Comment: (NOTE)                          The eGFR has been calculated using the CKD EPI equation.                          This calculation has not been validated in all clinical situations.                          eGFR's persistently <90 mL/min signify possible Chronic Kidney  Disease.  . Glucose-Capillary 11/30/2012 118* 70 - 99 mg/dL Final  . Comment 1 16/11/9602 Notify RN   Final  . Glucose-Capillary 11/30/2012 104* 70 - 99 mg/dL Final  . Comment 1 54/10/8117 Notify RN   Final  . WBC 12/01/2012 9.6  4.0 - 10.5 K/uL Final  . RBC 12/01/2012 4.62  4.22 - 5.81 MIL/uL Final  . Hemoglobin 12/01/2012 13.9  13.0 - 17.0 g/dL Final  . HCT 14/78/2956 41.0  39.0 - 52.0 % Final  . MCV 12/01/2012 88.7  78.0 - 100.0 fL Final  . MCH 12/01/2012 30.1  26.0 - 34.0 pg Final  . MCHC 12/01/2012 33.9  30.0 - 36.0 g/dL Final  . RDW 21/30/8657 13.0  11.5 - 15.5 % Final  . Platelets 12/01/2012 154  150 - 400 K/uL Final  . Sodium 12/01/2012 138  135 - 145 mEq/L Final  . Potassium 12/01/2012 3.5  3.5 - 5.1 mEq/L Final  . Chloride 12/01/2012 103  96 - 112 mEq/L Final  . CO2 12/01/2012 26  19 - 32 mEq/L Final  . Glucose, Bld 12/01/2012 113* 70 - 99 mg/dL Final  . BUN 84/69/6295 16  6 - 23 mg/dL Final  . Creatinine, Ser 12/01/2012 0.73  0.50 - 1.35 mg/dL Final  . Calcium 28/41/3244 8.4  8.4 - 10.5 mg/dL Final  . GFR calc non Af Amer 12/01/2012 89* >90 mL/min Final  . GFR calc Af Amer 12/01/2012 >90  >90 mL/min Final   Comment: (NOTE)                          The eGFR has been calculated using the CKD  EPI equation.                          This calculation has not been validated in all clinical situations.                          eGFR's persistently <90 mL/min signify possible Chronic Kidney                          Disease.  . Glucose-Capillary 11/30/2012 113* 70 - 99 mg/dL Final  . Comment 1 02/05/7251 Notify RN   Final  . Comment 2 11/30/2012 Documented in Chart   Final  . Glucose-Capillary 11/30/2012 108* 70 - 99 mg/dL Final  . Comment 1 66/44/0347 Notify RN   Final  . Comment 2 11/30/2012 Documented in Chart   Final  . Glucose-Capillary 12/01/2012 111* 70 - 99 mg/dL Final  . Comment 1 42/59/5638 Notify RN   Final  . ANA 12/01/2012 NEGATIVE  NEGATIVE Final   Performed at [facility]  . PSA 12/01/2012 3.01  <=4.00 ng/mL Final   Comment: (NOTE)                          Test Methodology: ECLIA PSA (Electrochemiluminescence Immunoassay)                          For PSA values from 2.5-4.0, particularly in younger men <60 years                          old, the AUA and NCCN suggest testing for % Free PSA (  3515) and                          evaluation of the rate of increase in PSA (PSA velocity).                          Performed at [facility]  . CRP 12/01/2012 1.4* <0.60 mg/dL Final   Performed at [facility]  . Total Protein 12/01/2012 6.2  6.0 - 8.3 g/dL Final  . Albumin ELP 95/62/1308 54.9* 55.8 - 66.1 % Final  . Alpha-1-Globulin 12/01/2012 5.5* 2.9 - 4.9 % Final  . Alpha-2-Globulin 12/01/2012 12.7* 7.1 - 11.8 % Final  . Beta Globulin 12/01/2012 6.0  4.7 - 7.2 % Final  . Beta 2 12/01/2012 4.2  3.2 - 6.5 % Final  . Gamma Globulin 12/01/2012 16.7  11.1 - 18.8 % Final  . M-Spike, % 12/01/2012 0.65   Final  . SPE Interp. 12/01/2012 (NOTE)   Final   Comment: A restricted band consistent with monoclonal protein is present.                          The monoclonal protein peak accounts for 0.65 g/dL of the total                          1.04 g/dL of  protein in the gamma region.                          Results are consistent with SPE performed on 12/01/2012 Reviewed by                          Dallas Breeding, MD, PhD, FCAP (Electronic Signature on File)  . Comment 12/01/2012 (NOTE)   Final   Comment: ---------------                          Serum protein electrophoresis is a useful screening procedure in the                          detection of various pathophysiologic states such as inflammation,                          gammopathies, protein loss and other dysproteinemias.  Immunofixation                          electrophoresis (IFE) is a more sensitive technique for the                          identification of M-proteins found in patients with monoclonal                          gammopathy of unknown significance (MGUS), amyloidosis, early or                          treated myeloma or macroglobulinemia, solitary plasmacytoma or  extramedullary plasmacytoma.  . IgG (Immunoglobin G), Serum 12/01/2012 1100  650 - 1600 mg/dL Final  . IgA 44/02/270 170  68 - 379 mg/dL Final  . IgM, Serum 53/66/4403 19* 41 - 251 mg/dL Final  . Immunofix Electr Int 12/01/2012 (NOTE)   Final   Comment: Monoclonal IgG kappa protein is present.                          Reviewed by Dallas Breeding, MD, PhD, FCAP (Electronic Signature on                          File)                          Performed at [facility]  . Beta-2 Microglobulin 12/01/2012 1.42  1.01 - 1.73 mg/L Final   Performed at [facility]  . Time 12/01/2012 24   Final  . Volume, Urine 12/01/2012 35   Corrected   CORRECTED ON 10/31 AT 1256: PREVIOUSLY REPORTED AS 35  . Total Protein, Urine 12/01/2012 1.4   Final   No established reference range.  . Total Protein, Urine-Ur/day 12/01/2012 0* 10 - 140 mg/day Final   Comment: (NOTE)                          Total urinary protein is determined by adding the albumin and Kappa                           and/or Lambda light chains.  This value may not agree with the total                          protein as determined by chemical methods, which characteristically                          underestimate urinary light chains.  . Albumin, U 12/01/2012 DETECTED  DETECTED Final  . Alpha 1, Urine 12/01/2012 DETECTED* NONE DETECTED Final  . Alpha 2, Urine 12/01/2012 DETECTED* NONE DETECTED Final  . Beta, Urine 12/01/2012 DETECTED* NONE DETECTED Final  . Gamma Globulin, Urine 12/01/2012 DETECTED* NONE DETECTED Final  . Free Kappa Lt Chains,Ur 12/01/2012 0.82  0.14 - 2.42 mg/dL Final  . Free Lt Chn Excr Rate 12/01/2012 0.29   Final  . Free Lambda Lt Chains,Ur 12/01/2012 0.06  0.02 - 0.67 mg/dL Final  . Free Lambda Excretion/Day 12/01/2012 0.02   Final  . Free Kappa/Lambda Ratio 12/01/2012 13.67* 2.04 - 10.37 ratio Final   (NOTE)  . Immunofixation, Urine 12/01/2012 (NOTE)   Final   Comment: No monoclonal free light chains (Bence Jones Protein) are detected.                          Urine IFE shows polyclonal increase in free Kappa and/or free Lambda                          light chains.                          Reviewed by Dallas Breeding, MD, PhD, FCAP (Electronic Signature on  File)                          Performed at [facility]  . Immunofixation, Urine 12/01/2012 (NOTE)   Final   Comment: Negative for monoclonal free light chains (Bence Jones protein).                          Reviewed by Dallas Breeding, MD, PhD, FCAP (Electronic Signature on                          File)                          Performed at [facility]  . Glucose-Capillary 12/01/2012 109* 70 - 99 mg/dL Final    PATHOLOGY:  Urinalysis    Component Value Date/Time   COLORURINE YELLOW 11/29/2012 2005   APPEARANCEUR CLEAR 11/29/2012 2005   LABSPEC 1.010 11/29/2012 2005   PHURINE 5.5 11/29/2012 2005   GLUCOSEU NEGATIVE 11/29/2012 2005   HGBUR NEGATIVE  11/29/2012 2005   BILIRUBINUR NEGATIVE 11/29/2012 2005   KETONESUR NEGATIVE 11/29/2012 2005   PROTEINUR NEGATIVE 11/29/2012 2005   UROBILINOGEN 0.2 11/29/2012 2005   NITRITE NEGATIVE 11/29/2012 2005   LEUKOCYTESUR NEGATIVE 11/29/2012 2005    RADIOGRAPHIC STUDIES: Mr Cervical Spine W Wo Contrast  12/27/2012   CLINICAL DATA:  Neck pain. Severe left-sided neck pain for 8 weeks. History of multiple myeloma.  EXAM: MRI CERVICAL SPINE WITHOUT AND WITH CONTRAST  TECHNIQUE: Multiplanar and multiecho pulse sequences of the cervical spine, to include the craniocervical junction and cervicothoracic junction, were obtained according to standard protocol without and with intravenous contrast.  CONTRAST:  20mL MULTIHANCE GADOBENATE DIMEGLUMINE 529 MG/ML IV SOLN  BUN and creatinine were obtained on site at [facility] at  [address]  Results:  BUN 15 mg/dL,  Creatinine 0.8 mg/dL.  COMPARISON:  None.  FINDINGS: Study is technically degraded by motion artifact. There is a levoconvex torticollis with the apex at C6. Chronic appearing T1 superior endplate compression fracture is present. In the left side of the T1 vertebra, there is a heterogeneous T2 hyperintense enhancing lesion which may represent a lesion associated with multiple myeloma but is indeterminate. The left occipital condyle lesion is again noted with effacement of normal fatty marrow and post gadolinium enhancement, seen on sagittal imaging.  There are no other lesions suspicious for myeloma in the cervical spine. Multilevel cervical disc desiccation and degeneration is present. Cervical cord is within normal limits. No intra medullary lesion or cervical cord compression.  C2-C3:  Mild bilateral facet arthrosis.  No stenosis.  C3-C4: Central disc protrusion produces mild central stenosis, contacting and flattening the ventral cord. Right greater than left mild foraminal stenosis associated with both uncovertebral spurring and facet  arthrosis.  C4-C5: Shallow right eccentric disc osteophyte complex is present. This just contacts the ventral aspect of the cord. There is right foraminal stenosis associated with facet arthrosis and uncovertebral spurring.  C5-C6: Mild central stenosis associated with shallow disc osteophyte complex. Mild right foraminal encroachment secondary to facet arthrosis and uncovertebral spurring. Left foramen appears patent.  C6-C7: Bilateral foraminal stenosis associated with uncovertebral spurring and facet arthrosis. Evaluation degraded by motion artifact. Central canal appears patent despite a shallow disc osteophyte complex and posterior ligamentum flavum redundancy.  C7-T1: Small central disc protrusion.  Central canal is patent. Foramina appear adequately patent bilaterally.  T1-T2: Disc desiccation. Sagittal imaging only. There is no extraosseous extension of the T1 left-sided vertebral body lesion.  IMPRESSION: Moderate multilevel cervical spondylosis and facet arthrosis, detailed above. No pathologic fracture or epidural hematoma. No cord compression in this patient with a history of myeloma. Left occipital condyle infiltrative lesion appears unchanged compared to recent brain MRI. 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   Electronically Signed   By: Andreas Newport M.D.   On: 12/27/2012 15:20    ASSESSMENT:  #1. 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 #3. Gastroesophageal reflux disease.   PLAN:  #1. Proceed with radiotherapy without tissue diagnosis. #2. Continue dexamethasone 4 mg twice a day. #3. Temazepam 15-30 mg at bedtime for sleep. #4. Followup in 6 weeks. #5. Continue to wear a cervical collar.   All questions were answered. The patient  knows to call the clinic with any problems, questions or concerns. We can certainly see the patient much sooner if necessary.   I spent 25 minutes counseling the patient face to face. The total time spent in the appointment was 30 minutes.    Maurilio Lovely, MD 01/03/2013 11:40 AM

## 2013-01-03 NOTE — Patient Instructions (Signed)
[facility] Discharge Instructions  RECOMMENDATIONS MADE BY THE CONSULTANT AND ANY TEST RESULTS WILL BE SENT TO YOUR REFERRING PHYSICIAN.  EXAM FINDINGS BY THE PHYSICIAN TODAY AND SIGNS OR SYMPTOMS TO REPORT TO CLINIC OR PRIMARY PHYSICIAN: Exam and findings as discussed by Dr. Zigmund Daniel.  Continue to wear the collar.  Radiation as scheduled.  MEDICATIONS PRESCRIBED:  Restoril as prescribed for sleep.  INSTRUCTIONS/FOLLOW-UP: Follow-up in 6 weeks.  Thank you for choosing [name] to provide your oncology and hematology care.  To afford each patient quality time with our providers, please arrive at least 15 minutes before your scheduled appointment time.  With your help, our goal is to use those 15 minutes to complete the necessary work-up to ensure our physicians have the information they need to help with your evaluation and healthcare recommendations.    Effective January 1st, 2014, we ask that you re-schedule your appointment with our physicians should you arrive 10 or more minutes late for your appointment.  We strive to give you quality time with our providers, and arriving late affects you and other patients whose appointments are after yours.    Again, thank you for choosing [facility].  Our hope is that these requests will decrease the amount of time that you wait before being seen by our physicians.       _____________________________________________________________  Should you have questions after your visit to [facility], please contact our office at [phone] between the hours of 8:30 a.m. and 5:00 p.m.  Voicemails left after 4:30 p.m. will not be returned until the following business day.  For prescription refill requests, have your pharmacy contact our office with your prescription refill request.

## 2013-01-04 NOTE — Addendum Note (Signed)
Encounter addended by: Billie Lade, MD on: 01/04/2013  5:28 PM<BR>     Documentation filed: Follow-up Section, LOS Section, Notes Section

## 2013-01-04 NOTE — Progress Notes (Signed)
  Radiation Oncology         [phone] ________________________________  Name: Dean [name] MRN: [id number]  Date: 12/29/2012  DOB: 1935/10/20  SIMULATION AND TREATMENT PLANNING NOTE  DIAGNOSIS:  Left occipital condyle infiltrative lesion   NARRATIVE:  The patient was brought to the CT Simulation planning suite.  Identity was confirmed.  All relevant records and images related to the planned course of therapy were reviewed.  The patient freely provided informed written consent to proceed with treatment after reviewing the details related to the planned course of therapy. The consent form was witnessed and verified by the simulation staff.  Then, the patient was set-up in a stable reproducible  supine position for radiation therapy.  CT images were obtained.  Surface markings were placed.  The CT images were loaded into the planning software.  Then the target and avoidance structures were contoured.  Treatment planning then occurred.  The radiation prescription was entered and confirmed.  Then, I designed and supervised the construction of a total of 4 medically necessary complex treatment devices.  I have requested : 3D Simulation  I have requested a DVH of the following structures: GTV, PTV, spinal cord, Brain stem, .  I have ordered:dose calc.  PLAN:  The patient will receive 45 Gy in 25 fractions.  ________________________________  -----------------------------------  Billie Lade, PhD, MD

## 2013-01-05 ENCOUNTER — Ambulatory Visit
Admission: RE | Admit: 2013-01-05 | Discharge: 2013-01-05 | Disposition: A | Discharge status: Home / Self Care | Payer: Medicare Other | Source: Ambulatory Visit | Attending: Radiation Oncology | Admitting: Radiation Oncology

## 2013-01-05 DIAGNOSIS — M899 Disorder of bone, unspecified: Secondary | ICD-10-CM

## 2013-01-05 NOTE — Progress Notes (Signed)
  Radiation Oncology         [phone]) [phone] ________________________________  Name: Dean [name] MRN: [id number]  Date: 01/05/2013  DOB: 1935-10-14  Simulation Verification Note  Status: outpatient  NARRATIVE: The patient was brought to the treatment unit and placed in the planned treatment position. The clinical setup was verified. Then port films were obtained and uploaded to the radiation oncology medical record software.  The treatment beams were carefully compared against the planned radiation fields. The position location and shape of the radiation fields was reviewed. They targeted volume of tissue appears to be appropriately covered by the radiation beams. Organs at risk appear to be excluded as planned.  Based on my personal review, I approved the simulation verification. The patient's treatment will proceed as planned.  -----------------------------------  Billie Lade, PhD, MD

## 2013-01-05 NOTE — Progress Notes (Signed)
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 Pt states he has had pain along his upper shoulder and neck today. He states he took Morphine, but it "made him feel loopy and out of himself". He states he will not take it again. He states Hydrocodone gave him a headache. For pain today he has taken ASA 325 mg x 3 w/fair to good relief. He was given Restoril prn sleep, states he took 1 tab last night but did not sleep well. He is taking Decadron 1 tab twice daily 8 hours apart. No sign of thrush in pt's mouth today. He has Nystatin. Dr Roselind Messier in to see pt; instructed him to decrease Decadron to once daily in morning and to take Restoril 2 tabs tonight for sleep.

## 2013-01-05 NOTE — Progress Notes (Signed)
[facility]    Radiation Oncology [address] [place]     Maryln Gottron, M.D. [place], [place] [address]               Billie Lade, M.D., Ph.D. Phone: [phone]      Molli Hazard A. Kathrynn Running, M.D. Fax: [phone]      Radene Gunning, M.D., Ph.D.         Lurline Hare, M.D.         Grayland Jack, M.D Weekly Treatment Management Note  Name: Dean [name]     MRN: [id number]        CSN: [id number] Date: 01/05/2013      DOB: 1935/05/15  CC: Leo Grosser, MD         [place]    Status: Outpatient  Diagnosis: The encounter diagnosis was Skull lesion.  Current Dose: 1.8 Gy  Current Fraction: 1  Planned Dose: 45 Gy  Narrative: [name] was seen today for weekly treatment management. The chart was checked and CBCT  were reviewed. He is tolerating the treatments well thus far. He has had some problems sleeping at night related to his Decadron. I recommended he cut back to taking only 4 mg in the morning.  The patient stopped taking his morphine as he did not like the way it made him feel. Codeine  Current Outpatient Prescriptions  Medication Sig Dispense Refill  . aspirin EC 325 MG tablet Take 325 mg by mouth every 4 (four) hours as needed.      Marland Kitchen dexamethasone (DECADRON) 4 MG tablet Take 1 tablet (4 mg total) by mouth 2 (two) times daily with a meal.  30 tablet  1  . gabapentin (NEURONTIN) 400 MG capsule Take 400 mg by mouth 4 (four) times daily. May take up to five times daily depending on pain in feet      . nystatin (MYCOSTATIN) 100000 UNIT/ML suspension Take 5 mLs (500,000 Units total) by mouth 4 (four) times daily.  120 mL  0  . tamsulosin (FLOMAX) 0.4 MG CAPS capsule Take 0.4 mg by mouth every evening.      . temazepam (RESTORIL) 15 MG capsule Take 1 capsule (15 mg total) by mouth at bedtime as needed for sleep. May take 2 capsules at once if one is ineffective.  60 capsule  0  . triamcinolone cream (KENALOG) 0.1 % Apply 1 application topically 2  (two) times daily.      Marland Kitchen ALPRAZolam (XANAX) 1 MG tablet Take 1 mg by mouth 2 (two) times daily as needed. For anxiety and/or sleep      . HYDROcodone-acetaminophen (NORCO/VICODIN) 5-325 MG per tablet Take 1 tablet by mouth every 6 (six) hours as needed for pain.  30 tablet  0  . morphine (MSIR) 15 MG tablet Take 1 tablet (15 mg total) by mouth every 4 (four) hours as needed for severe pain.  30 tablet  0  . naproxen sodium (ALEVE) 220 MG tablet Take 440 mg by mouth once as needed (for pain).       No current facility-administered medications for this encounter.   Labs:  Lab Results  Component Value Date   WBC 9.6 12/01/2012   HGB 13.9 12/01/2012   HCT 41.0 12/01/2012   MCV 88.7 12/01/2012   PLT 154 12/01/2012   Lab Results  Component Value Date   CREATININE 0.73 12/01/2012   BUN 16 12/01/2012   NA 138 12/01/2012   K  3.5 12/01/2012   CL 103 12/01/2012   CO2 26 12/01/2012   Lab Results  Component Value Date   ALT 12 11/30/2012   AST 13 11/30/2012   BILITOT 0.4 11/30/2012    Physical Examination:  There were no vitals filed for this visit.  Wt Readings from Last 3 Encounters:  01/03/13 264 lb 11.2 oz (120.067 kg)  12/29/12 267 lb 9.6 oz (121.383 kg)  12/10/12 264 lb (119.75 kg)   He continues to wear a neck collar No thrush noted in the oral cavity. Lungs - Normal respiratory effort, chest expands symmetrically. Lungs are clear to auscultation, no crackles or wheezes.  Heart has regular rhythm and rate  Abdomen is soft and non tender with normal bowel sounds  Assessment:  Patient tolerating treatments well  Plan: Continue treatment per original radiation prescription

## 2013-01-06 ENCOUNTER — Ambulatory Visit
Admission: RE | Admit: 2013-01-06 | Discharge: 2013-01-06 | Disposition: A | Discharge status: Home / Self Care | Payer: Medicare Other | Source: Ambulatory Visit | Attending: Radiation Oncology | Admitting: Radiation Oncology

## 2013-01-06 DIAGNOSIS — M899 Disorder of bone, unspecified: Secondary | ICD-10-CM | POA: Diagnosis not present

## 2013-01-07 ENCOUNTER — Ambulatory Visit
Admission: RE | Admit: 2013-01-07 | Discharge: 2013-01-07 | Disposition: A | Discharge status: Home / Self Care | Payer: Medicare Other | Source: Ambulatory Visit | Attending: Radiation Oncology | Admitting: Radiation Oncology

## 2013-01-07 DIAGNOSIS — M899 Disorder of bone, unspecified: Secondary | ICD-10-CM | POA: Diagnosis not present

## 2013-01-07 NOTE — Progress Notes (Signed)
error 

## 2013-01-10 ENCOUNTER — Ambulatory Visit
Admission: RE | Admit: 2013-01-10 | Discharge: 2013-01-10 | Disposition: A | Discharge status: Home / Self Care | Payer: Medicare Other | Source: Ambulatory Visit | Attending: Radiation Oncology | Admitting: Radiation Oncology

## 2013-01-10 DIAGNOSIS — M899 Disorder of bone, unspecified: Secondary | ICD-10-CM | POA: Diagnosis not present

## 2013-01-11 ENCOUNTER — Ambulatory Visit
Admission: RE | Admit: 2013-01-11 | Discharge: 2013-01-11 | Disposition: A | Discharge status: Home / Self Care | Payer: Medicare Other | Source: Ambulatory Visit | Attending: Radiation Oncology | Admitting: Radiation Oncology

## 2013-01-11 ENCOUNTER — Encounter: Payer: Self-pay | Admitting: Family Medicine

## 2013-01-11 ENCOUNTER — Ambulatory Visit (INDEPENDENT_AMBULATORY_CARE_PROVIDER_SITE_OTHER): Payer: Medicare Other | Admitting: Family Medicine

## 2013-01-11 VITALS — BP 144/74 | HR 73 | Temp 97.4°F | Ht 68.0 in | Wt 261.3 lb

## 2013-01-11 VITALS — BP 120/78 | HR 80 | Temp 97.0°F | Resp 18 | Wt 260.0 lb

## 2013-01-11 DIAGNOSIS — M899 Disorder of bone, unspecified: Secondary | ICD-10-CM

## 2013-01-11 MED ORDER — GABAPENTIN 400 MG PO CAPS: 400.0000 mg | ORAL_CAPSULE | Freq: Four times a day (QID) | ORAL | Status: DC

## 2013-01-11 MED ORDER — OXYCODONE-ACETAMINOPHEN 10-325 MG PO TABS: 1.0000 | ORAL_TABLET | ORAL | Status: DC | PRN

## 2013-01-11 MED ORDER — BIAFINE EX EMUL
Freq: Two times a day (BID) | CUTANEOUS | Status: DC
  Administered 2013-01-11: 16:00:00 via TOPICAL

## 2013-01-11 NOTE — Progress Notes (Addendum)
Dean [name] here with his wife.  He has had 5 fractions to his base of skull.  He is rating his pain in the back of his neck and his head that he is rating at a 4/10.  He reports the pain has greatly improved since he started.  He is currently taking decadron 4 mg daily.  He reports that his appetite has improved.  He reports a metallic taste when he eats.  He is wondering when he needs to wear the soft color.  He has been wearing it all the time.  His oral mucosa is intact.  He reports a dry mouth. His skin is intact in the treatment area.  He reports not being able to move his tongue to the left side of his mouth.  He was given the Radiation Therapy and You book and discussed the potential side effects of treatment including pain, skin changes, throat changes, mouth changes and fatigue.  He was given Biafine and was instructed to apply it twice a day after treatment and at bedtime.  He was oriented to the clinic and was provided with a business card so he can call with any questions or concerns.

## 2013-01-11 NOTE — Progress Notes (Signed)
[facility]    Radiation Oncology [address] [place]     Maryln Gottron, M.D. [place], [place] [address]               Billie Lade, M.D., Ph.D. Phone: [phone]      Molli Hazard A. Kathrynn Running, M.D. Fax: [phone]      Radene Gunning, M.D., Ph.D.         Lurline Hare, M.D.         Grayland Jack, M.D Weekly Treatment Management Note  Name: [name]     MRN: [id number]        CSN: [id number] Date: 01/11/2013      DOB: 21-Jun-1935  CC: Leo Grosser, MD         [place]    Status: Outpatient  Diagnosis: The encounter diagnosis was Skull lesion.  Current Dose: 9 Gy  Current Fraction: 5  Planned Dose: 45 Gy  Narrative: [name] was seen today for weekly treatment management. The chart was checked and CBCT  were reviewed. He is tolerating his treatments well thus far. His pain is better which is likely related to his steroids. Patient was given a prescription for Percocet earlier today by Dr Tanya Nones and will fill this later. He continues to have difficulties with eating related to the left side of his tongue not working.  Codeine and Morphine and related Current Outpatient Prescriptions  Medication Sig Dispense Refill  . ALPRAZolam (XANAX) 1 MG tablet Take 1 mg by mouth 2 (two) times daily as needed. For anxiety and/or sleep      . aspirin EC 325 MG tablet Take 325 mg by mouth every 4 (four) hours as needed.      Marland Kitchen dexamethasone (DECADRON) 4 MG tablet Take 1 tablet (4 mg total) by mouth 2 (two) times daily with a meal.  30 tablet  1  . gabapentin (NEURONTIN) 400 MG capsule Take 1 capsule (400 mg total) by mouth 4 (four) times daily. May take up to five times daily depending on pain in feet  180 capsule  5  . tamsulosin (FLOMAX) 0.4 MG CAPS capsule Take 0.4 mg by mouth every evening.      . temazepam (RESTORIL) 15 MG capsule Take 1 capsule (15 mg total) by mouth at bedtime as needed for sleep. May take 2 capsules at once if one is ineffective.  60  capsule  0  . HYDROcodone-acetaminophen (NORCO/VICODIN) 5-325 MG per tablet Take 1 tablet by mouth every 6 (six) hours as needed for pain.  30 tablet  0  . oxyCODONE-acetaminophen (PERCOCET) 10-325 MG per tablet Take 1 tablet by mouth every 4 (four) hours as needed for pain.  60 tablet  0  . triamcinolone cream (KENALOG) 0.1 % Apply 1 application topically 2 (two) times daily.       No current facility-administered medications for this encounter.   Labs:  Lab Results  Component Value Date   WBC 9.6 12/01/2012   HGB 13.9 12/01/2012   HCT 41.0 12/01/2012   MCV 88.7 12/01/2012   PLT 154 12/01/2012   Lab Results  Component Value Date   CREATININE 0.73 12/01/2012   BUN 16 12/01/2012   NA 138 12/01/2012   K 3.5 12/01/2012   CL 103 12/01/2012   CO2 26 12/01/2012   Lab Results  Component Value Date   ALT 12 11/30/2012   AST 13 11/30/2012   BILITOT 0.4 11/30/2012    Physical  Examination:  Filed Vitals:   01/11/13 1206  BP: 144/74  Pulse: 73  Temp: 97.4 F (36.3 C)    Wt Readings from Last 3 Encounters:  01/11/13 261 lb 4.8 oz (118.525 kg)  01/11/13 260 lb (117.935 kg)  01/03/13 264 lb 11.2 oz (120.067 kg)    The oral cavity is free of secondary infection. The left side of his tongue  appears somewhat swollen but with palpation this is soft. Lungs - Normal respiratory effort, chest expands symmetrically. Lungs are clear to auscultation, no crackles or wheezes.  Heart has regular rhythm and rate  Abdomen is soft and non tender with normal bowel sounds  Assessment:  Patient tolerating treatments well  Plan: Continue treatment per original radiation prescription

## 2013-01-11 NOTE — Progress Notes (Signed)
Subjective:    Patient ID: Dean [name], male    DOB: Nov 22, 1935, 77 y.o.   MRN: [id number]  HPI After our last office visit, the patient was found to have an infiltrative lesion in his left occipital condyle which was likely causing neuropathic pain in his head as well as left hypoglossal nerve dysfunction.  He has seen neurosurgery but feels surgical excision is too dangerous. He is also felt to be too dangerous to pursue a tissue biopsy. Therefore at the present time, the patient is undergoing palliative radiotherapy to try to shrink the lesion to treat his pain.  There is concern that this lesion could represent a plasmacytoma versus a focus of multiple myeloma. He is currently seeing oncology who is monitoring the lesion.  His biggest concern today is persistent pain in his neck and in his head. Gabapentin 400 mg by mouth 4-5 times a day seems to help the pain substantially. However morphine and Dilaudid our way to strong and leave the patient feeling like a zombie. He would like to try something for pain which is stronger than hydrocodone but hopefully not as powerful as morphine. Past Medical History  Diagnosis Date  . Neuropathy   . BPH (benign prostatic hyperplasia)   . GERD (gastroesophageal reflux disease)   . H/O ETOH abuse   . Skull lesion     Left occipital condyle   Past Surgical History  Procedure Laterality Date  . Cyst excision  1959    tail bone   Current Outpatient Prescriptions on File Prior to Visit  Medication Sig Dispense Refill  . ALPRAZolam (XANAX) 1 MG tablet Take 1 mg by mouth 2 (two) times daily as needed. For anxiety and/or sleep      . aspirin EC 325 MG tablet Take 325 mg by mouth every 4 (four) hours as needed.      Marland Kitchen HYDROcodone-acetaminophen (NORCO/VICODIN) 5-325 MG per tablet Take 1 tablet by mouth every 6 (six) hours as needed for pain.  30 tablet  0  . tamsulosin (FLOMAX) 0.4 MG CAPS capsule Take 0.4 mg by mouth every evening.      . temazepam  (RESTORIL) 15 MG capsule Take 1 capsule (15 mg total) by mouth at bedtime as needed for sleep. May take 2 capsules at once if one is ineffective.  60 capsule  0  . triamcinolone cream (KENALOG) 0.1 % Apply 1 application topically 2 (two) times daily.      Marland Kitchen dexamethasone (DECADRON) 4 MG tablet Take 1 tablet (4 mg total) by mouth 2 (two) times daily with a meal.  30 tablet  1   No current facility-administered medications on file prior to visit.   Allergies  Allergen Reactions  . Codeine Other (See Comments)    Severe headache  . Morphine And Related Other (See Comments)    Makes him feel weird   History   Social History  . Marital Status: Married    Spouse Name: N/A    Number of Children: 3  . Years of Education: N/A   Occupational History  .     Social History Main Topics  . Smoking status: Never Smoker   . Smokeless tobacco: Never Used  . Alcohol Use: No     Comment: " Not much no more"  . Drug Use: No  . Sexual Activity: Not on file   Other Topics Concern  . Not on file   Social History Narrative  . No narrative on file  Review of Systems  All other systems reviewed and are negative.       Objective:   Physical Exam  Vitals reviewed. Constitutional: He is oriented to person, place, and time.  Cardiovascular: Normal rate, regular rhythm and normal heart sounds.   Pulmonary/Chest: Effort normal and breath sounds normal. No respiratory distress. He has no wheezes. He has no rales.  Abdominal: Soft. Bowel sounds are normal.  Neurological: He is alert and oriented to person, place, and time. No cranial nerve deficit. He exhibits normal muscle tone. Coordination normal.  Psychiatric: He has a normal mood and affect. His behavior is normal. Judgment and thought content normal.   patient is wearing a soft cervical collar as recommended by his oncologist as well as his neurosurgeon.        Assessment & Plan:  1. Skull lesion Continue gabapentin 400 mg 5  times a day as needed for nerve pain.  I also gave the patient a prescription for Percocet 10/325 one by mouth every 6 hours as needed for pain.. I gave the patient 60 tablets with 0 refills.  I also recommended the Prevnar vaccine.  However the patient would like to wait until after he has finished his radiation treatments.

## 2013-01-11 NOTE — Addendum Note (Signed)
Encounter addended by: Eduardo Osier, RN on: 01/11/2013  4:12 PM<BR>     Documentation filed: Inpatient MAR

## 2013-01-12 ENCOUNTER — Ambulatory Visit
Admission: RE | Admit: 2013-01-12 | Discharge: 2013-01-12 | Disposition: A | Discharge status: Home / Self Care | Payer: Medicare Other | Source: Ambulatory Visit | Attending: Radiation Oncology | Admitting: Radiation Oncology

## 2013-01-12 DIAGNOSIS — M899 Disorder of bone, unspecified: Secondary | ICD-10-CM | POA: Diagnosis not present

## 2013-01-13 ENCOUNTER — Ambulatory Visit
Admission: RE | Admit: 2013-01-13 | Discharge: 2013-01-13 | Disposition: A | Discharge status: Home / Self Care | Payer: Medicare Other | Source: Ambulatory Visit | Attending: Radiation Oncology | Admitting: Radiation Oncology

## 2013-01-13 DIAGNOSIS — M899 Disorder of bone, unspecified: Secondary | ICD-10-CM | POA: Diagnosis not present

## 2013-01-14 ENCOUNTER — Ambulatory Visit
Admission: RE | Admit: 2013-01-14 | Discharge: 2013-01-14 | Disposition: A | Discharge status: Home / Self Care | Payer: Medicare Other | Source: Ambulatory Visit | Attending: Radiation Oncology | Admitting: Radiation Oncology

## 2013-01-14 DIAGNOSIS — M899 Disorder of bone, unspecified: Secondary | ICD-10-CM | POA: Diagnosis not present

## 2013-01-17 ENCOUNTER — Ambulatory Visit
Admission: RE | Admit: 2013-01-17 | Discharge: 2013-01-17 | Disposition: A | Discharge status: Home / Self Care | Payer: Medicare Other | Source: Ambulatory Visit | Attending: Radiation Oncology | Admitting: Radiation Oncology

## 2013-01-17 DIAGNOSIS — M899 Disorder of bone, unspecified: Secondary | ICD-10-CM | POA: Diagnosis not present

## 2013-01-18 ENCOUNTER — Ambulatory Visit: Payer: Medicare Other | Admitting: Family Medicine

## 2013-01-18 ENCOUNTER — Encounter: Payer: Self-pay | Admitting: Radiation Oncology

## 2013-01-18 ENCOUNTER — Ambulatory Visit
Admission: RE | Admit: 2013-01-18 | Discharge: 2013-01-18 | Disposition: A | Discharge status: Home / Self Care | Payer: Medicare Other | Source: Ambulatory Visit | Attending: Radiation Oncology | Admitting: Radiation Oncology

## 2013-01-18 VITALS — BP 147/78 | HR 80 | Resp 16 | Wt 262.0 lb

## 2013-01-18 DIAGNOSIS — M899 Disorder of bone, unspecified: Secondary | ICD-10-CM | POA: Diagnosis not present

## 2013-01-18 NOTE — Progress Notes (Signed)
Reports he is not sleeping despite taking xanax and temazepam. Reports his decadron has tapered down 2 mg once per day. Reports he only has 8 tablets left. Reports a left temporal headache. Reports taking his BC powder just prior to appointment today. Denies nausea, vomiting, dizziness, diplopia or ringing in the ears. Steady gait noted. Reports fatigue.

## 2013-01-18 NOTE — Progress Notes (Signed)
[facility]    Radiation Oncology [address] [place]     Maryln Gottron, M.D. [place], [place] [address]               Billie Lade, M.D., Ph.D. Phone: [phone]      Molli Hazard A. Kathrynn Running, M.D. Fax: [phone]      Radene Gunning, M.D., Ph.D.         Lurline Hare, M.D.         Grayland Jack, M.D Weekly Treatment Management Note  Name: Dean [name]     MRN: [id number]        CSN: [id number] Date: 01/18/2013      DOB: 11-Aug-1935  CC: Dean Grosser, MD         [place]    Status: Outpatient  Diagnosis: The encounter diagnosis was Skull lesion.  Current Dose: 18 Gy  Current Fraction: 10  Planned Dose: 45 Gy  Narrative: Dean [name] was seen today for weekly treatment management. The chart was checked and CBCT  were reviewed. Overall the patient's pain in the cervical area has improved significantly. He is taking very little pain medication at this time. Patient's major problem at this time his difficulty sleeping at night. In light of this issue will taper his Decadron. He will start taking one half tablet daily for one week followed by one half tablet every other day for a week. He has been taking a 4 mg tablet daily over the past week he does take Restoril at night and Xanax and still has difficulty sleeping.  He is speaking better and is able to eat better suggesting better tongue function.  Codeine and Morphine and related Current Outpatient Prescriptions  Medication Sig Dispense Refill  . ALPRAZolam (XANAX) 1 MG tablet Take 1 mg by mouth 2 (two) times daily as needed. For anxiety and/or sleep      . aspirin EC 325 MG tablet Take 325 mg by mouth every 4 (four) hours as needed.      Marland Kitchen dexamethasone (DECADRON) 4 MG tablet Take 1 tablet (4 mg total) by mouth 2 (two) times daily with a meal.  30 tablet  1  . gabapentin (NEURONTIN) 400 MG capsule Take 1 capsule (400 mg total) by mouth 4 (four) times daily. May take up to five times daily depending on  pain in feet  180 capsule  5  . HYDROcodone-acetaminophen (NORCO/VICODIN) 5-325 MG per tablet Take 1 tablet by mouth every 6 (six) hours as needed for pain.  30 tablet  0  . oxyCODONE-acetaminophen (PERCOCET) 10-325 MG per tablet Take 1 tablet by mouth every 4 (four) hours as needed for pain.  60 tablet  0  . tamsulosin (FLOMAX) 0.4 MG CAPS capsule Take 0.4 mg by mouth every evening.      . temazepam (RESTORIL) 15 MG capsule Take 1 capsule (15 mg total) by mouth at bedtime as needed for sleep. May take 2 capsules at once if one is ineffective.  60 capsule  0  . triamcinolone cream (KENALOG) 0.1 % Apply 1 application topically 2 (two) times daily.       No current facility-administered medications for this encounter.    Physical Examination:  Filed Vitals:   01/18/13 0829  BP: 147/78  Pulse: 80  Resp: 16    Wt Readings from Last 3 Encounters:  01/18/13 262 lb (118.842 kg)  01/11/13 261 lb 4.8 oz (118.525 kg)  01/11/13 260 lb (117.935  kg)    The patient is alert and in good spirits. The oral cavity is free of secondary infection Lungs - Normal respiratory effort, chest expands symmetrically. Lungs are clear to auscultation, no crackles or wheezes.  Heart has regular rhythm and rate  Abdomen is soft and non tender with normal bowel sounds  Assessment:  Patient tolerating treatments well  Plan: Continue treatment per original radiation prescription

## 2013-01-19 ENCOUNTER — Ambulatory Visit
Admission: RE | Admit: 2013-01-19 | Discharge: 2013-01-19 | Disposition: A | Discharge status: Home / Self Care | Payer: Medicare Other | Source: Ambulatory Visit | Attending: Radiation Oncology | Admitting: Radiation Oncology

## 2013-01-19 DIAGNOSIS — M899 Disorder of bone, unspecified: Secondary | ICD-10-CM | POA: Diagnosis not present

## 2013-01-20 ENCOUNTER — Ambulatory Visit
Admission: RE | Admit: 2013-01-20 | Discharge: 2013-01-20 | Disposition: A | Discharge status: Home / Self Care | Payer: Medicare Other | Source: Ambulatory Visit | Attending: Radiation Oncology | Admitting: Radiation Oncology

## 2013-01-20 DIAGNOSIS — M899 Disorder of bone, unspecified: Secondary | ICD-10-CM | POA: Diagnosis not present

## 2013-01-21 ENCOUNTER — Ambulatory Visit
Admission: RE | Admit: 2013-01-21 | Discharge: 2013-01-21 | Disposition: A | Discharge status: Home / Self Care | Payer: Medicare Other | Source: Ambulatory Visit | Attending: Radiation Oncology | Admitting: Radiation Oncology

## 2013-01-21 DIAGNOSIS — M899 Disorder of bone, unspecified: Secondary | ICD-10-CM | POA: Diagnosis not present

## 2013-01-24 ENCOUNTER — Ambulatory Visit
Admission: RE | Admit: 2013-01-24 | Discharge: 2013-01-24 | Disposition: A | Discharge status: Home / Self Care | Payer: Medicare Other | Source: Ambulatory Visit | Attending: Radiation Oncology | Admitting: Radiation Oncology

## 2013-01-24 DIAGNOSIS — M899 Disorder of bone, unspecified: Secondary | ICD-10-CM | POA: Diagnosis not present

## 2013-01-25 ENCOUNTER — Ambulatory Visit
Admission: RE | Admit: 2013-01-25 | Discharge: 2013-01-25 | Disposition: A | Discharge status: Home / Self Care | Payer: Medicare Other | Source: Ambulatory Visit | Attending: Radiation Oncology | Admitting: Radiation Oncology

## 2013-01-25 ENCOUNTER — Inpatient Hospital Stay
Admission: RE | Admit: 2013-01-25 | Discharge: 2013-01-25 | Disposition: A | Discharge status: Home / Self Care | Payer: Medicare Other | Source: Ambulatory Visit | Attending: Radiation Oncology | Admitting: Radiation Oncology

## 2013-01-25 VITALS — BP 135/72 | HR 72 | Temp 97.4°F | Ht 68.0 in | Wt 259.3 lb

## 2013-01-25 DIAGNOSIS — M899 Disorder of bone, unspecified: Secondary | ICD-10-CM | POA: Diagnosis not present

## 2013-01-25 NOTE — Progress Notes (Signed)
Dean [name] has had 15 fractions to the base of his skull.  He is rating his pain starting on the left side of his head and radiation around the back of his head at a 1/10.  He took two aspirin this morning that helped.  He is having trouble sleeping.  He says the xanax and restoril do not help him sleep.  He says he is not fatigued.  He is tapering off the decadron and is taking 1/2 tablet every other day.  He reports a dry mouth.  He is wondering if he needs to wear the cervical collar all the time.  His oral mucosa is intact.  He has not started using biafine yet.  The skin on the back of his neck is intact.

## 2013-01-25 NOTE — Progress Notes (Signed)
[facility]    Radiation Oncology [address] [place]     Maryln Gottron, M.D. [place], [place] [address]               Billie Lade, M.D., Ph.D. Phone: [phone]      Molli Hazard A. Kathrynn Running, M.D. Fax: [phone]      Radene Gunning, M.D., Ph.D.         Lurline Hare, M.D.         Grayland Jack, M.D Weekly Treatment Management Note  Name: Dean [name]     MRN: [id number]        CSN: [id number] Date: 01/25/2013      DOB: 06-18-1935  CC: Leo Grosser, MD         [place]    Status: Outpatient  Diagnosis: The encounter diagnosis was Skull lesion.  Current Dose: 27 Gy  Current Fraction: 15  Planned Dose: 45 Gy  Narrative: [name] was seen today for weekly treatment management. The chart was checked and CBCT  were reviewed. He is tolerating his treatments well. He rates his pain 1/10. He continues to have difficulty sleeping at night but will complete his Decadron taper early next week. I did suggest he try 2  the sleeping pills since he was started on a low dose.  Codeine and Morphine and related Current Outpatient Prescriptions  Medication Sig Dispense Refill  . ALPRAZolam (XANAX) 1 MG tablet Take 1 mg by mouth 2 (two) times daily as needed. For anxiety and/or sleep      . aspirin EC 325 MG tablet Take 325 mg by mouth every 4 (four) hours as needed.      Marland Kitchen dexamethasone (DECADRON) 4 MG tablet Take 1 tablet (4 mg total) by mouth 2 (two) times daily with a meal.  30 tablet  1  . gabapentin (NEURONTIN) 400 MG capsule Take 1 capsule (400 mg total) by mouth 4 (four) times daily. May take up to five times daily depending on pain in feet  180 capsule  5  . oxyCODONE-acetaminophen (PERCOCET) 10-325 MG per tablet Take 1 tablet by mouth every 4 (four) hours as needed for pain.  60 tablet  0  . tamsulosin (FLOMAX) 0.4 MG CAPS capsule Take 0.4 mg by mouth every evening.      . temazepam (RESTORIL) 15 MG capsule Take 1 capsule (15 mg total) by mouth at  bedtime as needed for sleep. May take 2 capsules at once if one is ineffective.  60 capsule  0  . HYDROcodone-acetaminophen (NORCO/VICODIN) 5-325 MG per tablet Take 1 tablet by mouth every 6 (six) hours as needed for pain.  30 tablet  0  . triamcinolone cream (KENALOG) 0.1 % Apply 1 application topically 2 (two) times daily.       No current facility-administered medications for this encounter.    Physical Examination:  Filed Vitals:   01/25/13 0914  BP: 135/72  Pulse: 72  Temp: 97.4 F (36.3 C)    Wt Readings from Last 3 Encounters:  01/25/13 259 lb 4.8 oz (117.618 kg)  01/18/13 262 lb (118.842 kg)  01/11/13 261 lb 4.8 oz (118.525 kg)    The oral cavity is free of secondary infection . The left tonsil continues to be prominent.  Lungs - Normal respiratory effort, chest expands symmetrically. Lungs are clear to auscultation, no crackles or wheezes.  Heart has regular rhythm and rate  Abdomen is soft and non tender with  normal bowel sounds  Assessment:  Patient tolerating treatments well  Plan: Continue treatment per original radiation prescription

## 2013-01-26 ENCOUNTER — Ambulatory Visit
Admission: RE | Admit: 2013-01-26 | Discharge: 2013-01-26 | Disposition: A | Discharge status: Home / Self Care | Payer: Medicare Other | Source: Ambulatory Visit | Attending: Radiation Oncology | Admitting: Radiation Oncology

## 2013-01-26 DIAGNOSIS — M899 Disorder of bone, unspecified: Secondary | ICD-10-CM | POA: Diagnosis not present

## 2013-01-28 ENCOUNTER — Ambulatory Visit
Admission: RE | Admit: 2013-01-28 | Discharge: 2013-01-28 | Disposition: A | Discharge status: Home / Self Care | Payer: Medicare Other | Source: Ambulatory Visit | Attending: Radiation Oncology | Admitting: Radiation Oncology

## 2013-01-28 DIAGNOSIS — M899 Disorder of bone, unspecified: Secondary | ICD-10-CM | POA: Diagnosis not present

## 2013-01-31 ENCOUNTER — Ambulatory Visit
Admission: RE | Admit: 2013-01-31 | Discharge: 2013-01-31 | Disposition: A | Discharge status: Home / Self Care | Payer: Medicare Other | Source: Ambulatory Visit | Attending: Radiation Oncology | Admitting: Radiation Oncology

## 2013-01-31 DIAGNOSIS — M899 Disorder of bone, unspecified: Secondary | ICD-10-CM | POA: Diagnosis not present

## 2013-02-01 ENCOUNTER — Ambulatory Visit
Admission: RE | Admit: 2013-02-01 | Discharge: 2013-02-01 | Disposition: A | Discharge status: Home / Self Care | Payer: Medicare Other | Source: Ambulatory Visit | Attending: Radiation Oncology | Admitting: Radiation Oncology

## 2013-02-01 ENCOUNTER — Ambulatory Visit: Admission: RE | Admit: 2013-02-01 | Payer: Medicare Other | Source: Ambulatory Visit | Admitting: Radiation Oncology

## 2013-02-01 VITALS — BP 139/81 | HR 83 | Temp 98.4°F | Ht 68.0 in | Wt 258.5 lb

## 2013-02-01 DIAGNOSIS — B370 Candidal stomatitis: Secondary | ICD-10-CM

## 2013-02-01 DIAGNOSIS — M899 Disorder of bone, unspecified: Secondary | ICD-10-CM | POA: Diagnosis not present

## 2013-02-01 NOTE — Progress Notes (Signed)
   Department of Radiation Oncology  Phone:  [phone] Fax:        [phone]  Weekly Treatment Note    Name: Dean [name] Date: 02/01/2013 MRN: [id number] DOB: 11-12-1935   Current dose: 34.2 Gy  Current fraction: 19   MEDICATIONS: Current Outpatient Prescriptions  Medication Sig Dispense Refill  . aspirin EC 325 MG tablet Take 325 mg by mouth every 4 (four) hours as needed.      Marland Kitchen emollient (BIAFINE) cream Apply topically 2 (two) times daily.      Marland Kitchen gabapentin (NEURONTIN) 400 MG capsule Take 1 capsule (400 mg total) by mouth 4 (four) times daily. May take up to five times daily depending on pain in feet  180 capsule  5  . tamsulosin (FLOMAX) 0.4 MG CAPS capsule Take 0.4 mg by mouth every evening.      . triamcinolone cream (KENALOG) 0.1 % Apply 1 application topically 2 (two) times daily.      Marland Kitchen ALPRAZolam (XANAX) 1 MG tablet Take 1 mg by mouth 2 (two) times daily as needed. For anxiety and/or sleep      . dexamethasone (DECADRON) 4 MG tablet Take 1 tablet (4 mg total) by mouth 2 (two) times daily with a meal.  30 tablet  1  . HYDROcodone-acetaminophen (NORCO/VICODIN) 5-325 MG per tablet Take 1 tablet by mouth every 6 (six) hours as needed for pain.  30 tablet  0  . oxyCODONE-acetaminophen (PERCOCET) 10-325 MG per tablet Take 1 tablet by mouth every 4 (four) hours as needed for pain.  60 tablet  0  . temazepam (RESTORIL) 15 MG capsule Take 1 capsule (15 mg total) by mouth at bedtime as needed for sleep. May take 2 capsules at once if one is ineffective.  60 capsule  0   No current facility-administered medications for this encounter.     ALLERGIES: Codeine and Morphine and related   LABORATORY DATA:  Lab Results  Component Value Date   WBC 9.6 12/01/2012   HGB 13.9 12/01/2012   HCT 41.0 12/01/2012   MCV 88.7 12/01/2012   PLT 154 12/01/2012   Lab Results  Component Value Date   NA 138 12/01/2012   K 3.5 12/01/2012   CL 103 12/01/2012   CO2 26 12/01/2012     Lab Results  Component Value Date   ALT 12 11/30/2012   AST 13 11/30/2012   ALKPHOS 22* 11/30/2012   BILITOT 0.4 11/30/2012     NARRATIVE: Dean [name] was seen today for weekly treatment management. The chart was checked and the patient's films were reviewed. The patient states that he is doing very well. He improved ability to move his tongue and tissue on the left. No sore throat. The patient is not on Decadron at this time having been able to taper off of this.  PHYSICAL EXAMINATION: height is 5\' 8"  (1.727 m) and weight is 258 lb 8 oz (117.255 kg). His temperature is 98.4 F (36.9 C). His blood pressure is 139/81 and his pulse is 83. His oxygen saturation is 97%.      no significant skin reaction in the treatment area  ASSESSMENT: The patient is doing satisfactorily with treatment.  PLAN: We will continue with the patient's radiation treatment as planned.

## 2013-02-01 NOTE — Progress Notes (Addendum)
Dean [name] has had 19 fractions to the base of his skull.  He denies pain.  He tapered off decadron and finished taking it on Friday.  He says he is know able to move his tongue to the left side of his mouth and chew on that side.  He denies a sore throat and has no trouble eating.  He has lost 1 lb since last week but is trying to loose weight.  He reports a dry mouth and says his taste buds are gone.  The skin on his neck has slight hyperpigmentation.  He is using biafine twice a day.

## 2013-02-02 ENCOUNTER — Ambulatory Visit
Admission: RE | Admit: 2013-02-02 | Discharge: 2013-02-02 | Disposition: A | Discharge status: Home / Self Care | Payer: Medicare Other | Source: Ambulatory Visit | Attending: Radiation Oncology | Admitting: Radiation Oncology

## 2013-02-02 DIAGNOSIS — M899 Disorder of bone, unspecified: Secondary | ICD-10-CM | POA: Diagnosis not present

## 2013-02-03 DIAGNOSIS — C9000 Multiple myeloma not having achieved remission: Secondary | ICD-10-CM

## 2013-02-03 HISTORY — DX: Multiple myeloma not having achieved remission: C90.00

## 2013-02-04 ENCOUNTER — Ambulatory Visit
Admission: RE | Admit: 2013-02-04 | Discharge: 2013-02-04 | Disposition: A | Discharge status: Home / Self Care | Payer: Medicare Other | Source: Ambulatory Visit | Attending: Radiation Oncology | Admitting: Radiation Oncology

## 2013-02-04 DIAGNOSIS — Z79899 Other long term (current) drug therapy: Secondary | ICD-10-CM | POA: Diagnosis not present

## 2013-02-04 DIAGNOSIS — Z510 Encounter for antineoplastic radiation therapy: Secondary | ICD-10-CM | POA: Diagnosis not present

## 2013-02-04 DIAGNOSIS — M47812 Spondylosis without myelopathy or radiculopathy, cervical region: Secondary | ICD-10-CM | POA: Diagnosis not present

## 2013-02-04 DIAGNOSIS — K148 Other diseases of tongue: Secondary | ICD-10-CM | POA: Diagnosis not present

## 2013-02-04 DIAGNOSIS — N400 Enlarged prostate without lower urinary tract symptoms: Secondary | ICD-10-CM | POA: Diagnosis not present

## 2013-02-04 DIAGNOSIS — M949 Disorder of cartilage, unspecified: Secondary | ICD-10-CM | POA: Diagnosis not present

## 2013-02-04 DIAGNOSIS — G589 Mononeuropathy, unspecified: Secondary | ICD-10-CM | POA: Diagnosis not present

## 2013-02-04 DIAGNOSIS — R5381 Other malaise: Secondary | ICD-10-CM | POA: Diagnosis not present

## 2013-02-04 DIAGNOSIS — M899 Disorder of bone, unspecified: Secondary | ICD-10-CM | POA: Diagnosis not present

## 2013-02-04 DIAGNOSIS — K219 Gastro-esophageal reflux disease without esophagitis: Secondary | ICD-10-CM | POA: Diagnosis not present

## 2013-02-04 DIAGNOSIS — M542 Cervicalgia: Secondary | ICD-10-CM | POA: Diagnosis not present

## 2013-02-04 DIAGNOSIS — R5383 Other fatigue: Secondary | ICD-10-CM | POA: Diagnosis not present

## 2013-02-07 ENCOUNTER — Ambulatory Visit
Admission: RE | Admit: 2013-02-07 | Discharge: 2013-02-07 | Disposition: A | Discharge status: Home / Self Care | Payer: Medicare Other | Source: Ambulatory Visit | Attending: Radiation Oncology | Admitting: Radiation Oncology

## 2013-02-07 DIAGNOSIS — M949 Disorder of cartilage, unspecified: Secondary | ICD-10-CM | POA: Diagnosis not present

## 2013-02-07 DIAGNOSIS — M899 Disorder of bone, unspecified: Secondary | ICD-10-CM | POA: Diagnosis not present

## 2013-02-08 ENCOUNTER — Ambulatory Visit
Admission: RE | Admit: 2013-02-08 | Discharge: 2013-02-08 | Disposition: A | Discharge status: Home / Self Care | Payer: Medicare Other | Source: Ambulatory Visit | Attending: Radiation Oncology | Admitting: Radiation Oncology

## 2013-02-08 VITALS — BP 135/76 | HR 88 | Temp 98.1°F | Ht 68.0 in | Wt 256.9 lb

## 2013-02-08 DIAGNOSIS — M899 Disorder of bone, unspecified: Secondary | ICD-10-CM

## 2013-02-08 DIAGNOSIS — M949 Disorder of cartilage, unspecified: Secondary | ICD-10-CM | POA: Diagnosis not present

## 2013-02-08 MED ORDER — ALPRAZOLAM 1 MG PO TABS: 1.0000 mg | ORAL_TABLET | Freq: Two times a day (BID) | ORAL | Status: DC | PRN

## 2013-02-08 NOTE — Progress Notes (Addendum)
Dean [name] has had 23/25 fractions to his base of skull.  He denies pain except for occasional twinges in his neck.  He is able to move his tongue to the left side now and to chew on the left side of his mouth.  He reports a dry mouth and change in his taste buds.  He denies a sore throat.  His oral mucosa is intact.  He finished taking decadron two weeks ago.  He reports weakness and fatigue.  He has lost two lbs this week.  Will set him up with a dietician appointment.  He is sleeping better.  His skin on the back of his neck is intact.  He noticed a "knot" last night that is now gone.  He is using biafine cream.

## 2013-02-08 NOTE — Progress Notes (Signed)
[place]     Rexene Edison, M.D. [place], [place] [address]               Blair Promise, M.D., Ph.D. Phone: [phone]      Rodman Key A. Tammi Klippel, M.D. Fax: [phone]      Jodelle Gross, M.D., Ph.D.         Thea Silversmith, M.D.         Wyvonnia Lora, M.D Weekly Treatment Management Note  Name: Dean [name]     MRN: [id number]        CSN: [id number] Date: 02/08/2013      DOB: [birth year]  CC: [name], MD         [place]    Status: Outpatient  Diagnosis: The encounter diagnosis was Skull lesion.  Current Dose: 41.4 Gy  Current Fraction: 23  Planned Dose: 45 Gy  Narrative: [name] was seen today for weekly treatment management. The chart was checked and CBCT  were reviewed. He continues to tolerate the treatments well this time. He has noticed some fatigue. Pain is much improved. Is not taking any pain medication other than the Neurontin. He has completed his Decadron taper. He is also talking better.  Codeine and Morphine and related Current Outpatient Prescriptions  Medication Sig Dispense Refill  . ALPRAZolam (XANAX) 1 MG tablet Take 1 tablet (1 mg total) by mouth 2 (two) times daily as needed. For anxiety and/or sleep  60 tablet  0  . aspirin EC 325 MG tablet Take 325 mg by mouth every 4 (four) hours as needed.      Marland Kitchen emollient (BIAFINE) cream Apply topically 2 (two) times daily.      Marland Kitchen gabapentin (NEURONTIN) 400 MG capsule Take 1 capsule (400 mg total) by mouth 4 (four) times daily. May take up to five times daily depending on pain in feet  180 capsule  5  . tamsulosin (FLOMAX) 0.4 MG CAPS capsule Take 0.4 mg by mouth every evening.      Marland Kitchen dexamethasone (DECADRON) 4 MG tablet Take 1 tablet (4 mg total) by mouth 2 (two) times daily with a meal.  30 tablet  1  . HYDROcodone-acetaminophen (NORCO/VICODIN) 5-325 MG per tablet Take 1 tablet by mouth every 6 (six) hours as needed for pain.  30  tablet  0  . oxyCODONE-acetaminophen (PERCOCET) 10-325 MG per tablet Take 1 tablet by mouth every 4 (four) hours as needed for pain.  60 tablet  0  . temazepam (RESTORIL) 15 MG capsule Take 1 capsule (15 mg total) by mouth at bedtime as needed for sleep. May take 2 capsules at once if one is ineffective.  60 capsule  0  . triamcinolone cream (KENALOG) 0.1 % Apply 1 application topically 2 (two) times daily.       No current facility-administered medications for this encounter.    Physical Examination:  Filed Vitals:   02/08/13 0928  BP: 135/76  Pulse: 88  Temp: 98.1 F (36.7 C)    Wt Readings from Last 3 Encounters:  02/08/13 256 lb 14.4 oz (116.529 kg)  02/01/13 258 lb 8 oz (117.255 kg)  01/25/13 259 lb 4.8 oz (117.618 kg)    No secondary infection in the oral cavity,  the patient's tongue shows less asymmetry Lungs - Normal respiratory effort, chest expands symmetrically. Lungs are clear to auscultation, no crackles or wheezes.  Heart has regular  rhythm and rate  Abdomen is soft and non tender with normal bowel sounds  Assessment:  Patient tolerating treatments well  Plan: Continue treatment per original radiation prescription

## 2013-02-09 ENCOUNTER — Ambulatory Visit
Admission: RE | Admit: 2013-02-09 | Discharge: 2013-02-09 | Disposition: A | Discharge status: Home / Self Care | Payer: Medicare Other | Source: Ambulatory Visit | Attending: Radiation Oncology | Admitting: Radiation Oncology

## 2013-02-09 ENCOUNTER — Encounter: Payer: Medicare Other | Admitting: Nutrition

## 2013-02-09 ENCOUNTER — Encounter: Payer: Self-pay | Admitting: Nutrition

## 2013-02-09 DIAGNOSIS — M949 Disorder of cartilage, unspecified: Secondary | ICD-10-CM | POA: Diagnosis not present

## 2013-02-09 DIAGNOSIS — M899 Disorder of bone, unspecified: Secondary | ICD-10-CM | POA: Diagnosis not present

## 2013-02-09 NOTE — Progress Notes (Signed)
Patient cancelled nutrition appointment. 

## 2013-02-10 ENCOUNTER — Ambulatory Visit
Admission: RE | Admit: 2013-02-10 | Discharge: 2013-02-10 | Disposition: A | Discharge status: Home / Self Care | Payer: Medicare Other | Source: Ambulatory Visit | Attending: Radiation Oncology | Admitting: Radiation Oncology

## 2013-02-10 DIAGNOSIS — M899 Disorder of bone, unspecified: Secondary | ICD-10-CM | POA: Diagnosis not present

## 2013-02-10 DIAGNOSIS — M949 Disorder of cartilage, unspecified: Secondary | ICD-10-CM | POA: Diagnosis not present

## 2013-02-14 ENCOUNTER — Encounter (HOSPITAL_COMMUNITY): Payer: Medicare Other | Attending: Hematology and Oncology

## 2013-02-14 ENCOUNTER — Encounter (HOSPITAL_COMMUNITY): Payer: Self-pay

## 2013-02-14 ENCOUNTER — Encounter (HOSPITAL_BASED_OUTPATIENT_CLINIC_OR_DEPARTMENT_OTHER): Payer: Medicare Other

## 2013-02-14 VITALS — BP 134/94 | HR 87 | Temp 97.2°F | Resp 18 | Wt 257.8 lb

## 2013-02-14 DIAGNOSIS — G4700 Insomnia, unspecified: Secondary | ICD-10-CM

## 2013-02-14 DIAGNOSIS — K219 Gastro-esophageal reflux disease without esophagitis: Secondary | ICD-10-CM

## 2013-02-14 DIAGNOSIS — M899 Disorder of bone, unspecified: Secondary | ICD-10-CM | POA: Insufficient documentation

## 2013-02-14 DIAGNOSIS — M949 Disorder of cartilage, unspecified: Secondary | ICD-10-CM | POA: Diagnosis not present

## 2013-02-14 LAB — CBC WITH DIFFERENTIAL/PLATELET
Basophils Absolute: 0 10*3/uL (ref 0.0–0.1)
Basophils Relative: 0 % (ref 0–1)
Eosinophils Absolute: 0 10*3/uL (ref 0.0–0.7)
Eosinophils Relative: 0 % (ref 0–5)
HCT: 45.3 % (ref 39.0–52.0)
Hemoglobin: 15.8 g/dL (ref 13.0–17.0)
Lymphocytes Relative: 44 % (ref 12–46)
Lymphs Abs: 3.2 10*3/uL (ref 0.7–4.0)
MCH: 30.9 pg (ref 26.0–34.0)
MCHC: 34.9 g/dL (ref 30.0–36.0)
MCV: 88.5 fL (ref 78.0–100.0)
Monocytes Absolute: 0.8 10*3/uL (ref 0.1–1.0)
Monocytes Relative: 11 % (ref 3–12)
Neutro Abs: 3.1 10*3/uL (ref 1.7–7.7)
Neutrophils Relative %: 44 % (ref 43–77)
Platelets: 210 10*3/uL (ref 150–400)
RBC: 5.12 MIL/uL (ref 4.22–5.81)
RDW: 12.8 % (ref 11.5–15.5)
WBC: 7.2 10*3/uL (ref 4.0–10.5)

## 2013-02-14 LAB — LACTATE DEHYDROGENASE: LDH: 206 U/L (ref 94–250)

## 2013-02-14 NOTE — Progress Notes (Signed)
[name]  OFFICE PROGRESS NOTE  Dean Fraction, MD [address] [place] [address]  DIAGNOSIS: Skull lesion, occipital condyle syndrome (RT) - Plan: CBC with Differential, Lactate dehydrogenase, Beta 2 microglobuline, serum, Multiple myeloma panel, serum, Kappa/lambda light chains, CBC with Differential, Multiple myeloma panel, serum, Kappa/lambda light chains, Beta 2 microglobuline, serum, Lactate dehydrogenase, CBC with Differential, Lactate dehydrogenase, Beta 2 microglobuline, serum, Multiple myeloma panel, serum, Kappa/lambda light chains  GERD (gastroesophageal reflux disease)  Chief Complaint  Patient presents with  . 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  INTERVAL HISTORY: Dean [name] 78 y.o. male returns for followup after treatment with external beam radiotherapy to left occipital condyle for a lytic lesion producing occipital condyle syndrome. He gets the sensation of controlling when he really isn't. Speech pattern has returned to normal. He still having trouble sleeping and so he purchased 2 pillows and is going to sleep back in the bed again is out of the chair. He denies any peripheral paresthesias, lower extremity swelling or redness, cough, wheezing, sore mouth, dry mouth, nausea, vomiting, easy satiety, bone pain, skin rash, headache, or seizures. Change of position of his neck does not result in any headache at this time.  MEDICAL HISTORY: Past Medical History  Diagnosis Date  . Neuropathy   . BPH (benign prostatic hyperplasia)   . GERD (gastroesophageal reflux disease)   . H/O ETOH abuse   . Skull lesion     Left occipital condyle    INTERIM HISTORY: has Neuropathy; BPH (benign prostatic hyperplasia); GERD (gastroesophageal reflux disease); H/O ETOH abuse; Skull lesion; Thrush;  and Neck pain on his problem list.    ALLERGIES:  is allergic to codeine and morphine and related.  MEDICATIONS: has a current medication list which includes the following prescription(s): alprazolam, aspirin ec, emollient, hydrocodone-acetaminophen, oxycodone-acetaminophen, tamsulosin, triamcinolone cream, dexamethasone, gabapentin, and temazepam.  SURGICAL HISTORY:  Past Surgical History  Procedure Laterality Date  . Cyst excision  1959    tail bone    FAMILY HISTORY: family history includes Diabetes in his mother; Hypertension in his father; Stroke in his mother.  SOCIAL HISTORY:  reports that he has never smoked. He has never used smokeless tobacco. He reports that he does not drink alcohol or use illicit drugs.  REVIEW OF SYSTEMS:  Other than that discussed above is noncontributory.  PHYSICAL EXAMINATION: ECOG PERFORMANCE STATUS: 1 - Symptomatic but completely ambulatory  Blood pressure 134/94, pulse 87, temperature 97.2 F (36.2 C), temperature source Oral, resp. rate 18, weight 257 lb 12.8 oz (116.937 kg).  GENERAL:alert, no distress and comfortable SKIN: skin color, texture, turgor are normal, no rashes or significant lesions EYES: PERLA; Conjunctiva are pink and non-injected, sclera clear OROPHARYNX:no exudate, no erythema on lips, buccal mucosa, or tongue. Tongue with full range of motion. NECK: supple, thyroid normal size, non-tender, without nodularity. No masses CHEST: Increased AP diameter with no gynecomastia. LYMPH:  no palpable lymphadenopathy in the cervical, axillary or inguinal LUNGS: clear to auscultation and percussion with normal breathing effort HEART: regular rate & rhythm and no murmurs. ABDOMEN:abdomen soft, non-tender and normal bowel sounds MUSCULOSKELETAL:no cyanosis of digits and no clubbing. Range of motion normal.  NEURO: alert & oriented x 3 with fluent speech, no focal motor/sensory deficits   LABORATORY DATA: Office Visit on 02/14/2013  Component Date Value Range Status  . WBC 02/14/2013 7.2  4.0 - 10.5 K/uL Final  . RBC 02/14/2013 5.12  4.22 - 5.81 MIL/uL Final  . Hemoglobin 02/14/2013 15.8  13.0 - 17.0 g/dL Final  . HCT 02/14/2013 45.3  39.0 - 52.0 % Final  . MCV 02/14/2013 88.5  78.0 - 100.0 fL Final  . MCH 02/14/2013 30.9  26.0 - 34.0 pg Final  . MCHC 02/14/2013 34.9  30.0 - 36.0 g/dL Final  . RDW 02/14/2013 12.8  11.5 - 15.5 % Final  . Platelets 02/14/2013 210  150 - 400 K/uL Final  . Neutrophils Relative % 02/14/2013 44  43 - 77 % Final  . Neutro Abs 02/14/2013 3.1  1.7 - 7.7 K/uL Final  . Lymphocytes Relative 02/14/2013 44  12 - 46 % Final  . Lymphs Abs 02/14/2013 3.2  0.7 - 4.0 K/uL Final  . Monocytes Relative 02/14/2013 11  3 - 12 % Final  . Monocytes Absolute 02/14/2013 0.8  0.1 - 1.0 K/uL Final  . Eosinophils Relative 02/14/2013 0  0 - 5 % Final  . Eosinophils Absolute 02/14/2013 0.0  0.0 - 0.7 K/uL Final  . Basophils Relative 02/14/2013 0  0 - 1 % Final  . Basophils Absolute 02/14/2013 0.0  0.0 - 0.1 K/uL Final    PATHOLOGY: No new pathology since none was obtained prior to radiotherapy.  Urinalysis    Component Value Date/Time   COLORURINE YELLOW 11/29/2012 2005   APPEARANCEUR CLEAR 11/29/2012 2005   LABSPEC 1.010 11/29/2012 2005   PHURINE 5.5 11/29/2012 2005   GLUCOSEU NEGATIVE 11/29/2012 2005   HGBUR NEGATIVE 11/29/2012 2005   BILIRUBINUR NEGATIVE 11/29/2012 2005   [place] NEGATIVE 11/29/2012 2005   PROTEINUR NEGATIVE 11/29/2012 2005   UROBILINOGEN 0.2 11/29/2012 2005   NITRITE NEGATIVE 11/29/2012 2005   LEUKOCYTESUR NEGATIVE 11/29/2012 2005    RADIOGRAPHIC STUDIES: No results found.  ASSESSMENT:  #1. 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 #2. Benign prostatic hypertrophy, asymptomatic. #3. Gastroesophageal reflux disease, controlled. #4. Insomnia awakening after 2  hours while sleeping in a chair.   PLAN:  #1. Continue temazepam 30 mg at bedtime, sleep in the bed, and enjoy the new kilos. #2. Continue on Xanax as needed, Flomax, and gabapentin. Patient is no longer taking dexamethasone. #3. Followup with radiotherapy in one month with followup here in 3 months pending today's lab reports.   All questions were answered. The patient knows to call the clinic with any problems, questions or concerns. We can certainly see the patient much sooner if necessary.   I spent 25 minutes counseling the patient face to face. The total time spent in the appointment was 30 minutes.    Doroteo Bradford, MD 02/14/2013 12:09 PM

## 2013-02-14 NOTE — Patient Instructions (Signed)
 [facility] Discharge Instructions  RECOMMENDATIONS MADE BY THE CONSULTANT AND ANY TEST RESULTS WILL BE SENT TO YOUR REFERRING PHYSICIAN.  Lab work today. Lab work again in 3 months prior to MD appointment. Report any issues/concerns to clinic as needed prior to appointments.  Thank you for choosing [facility] to provide your oncology and hematology care.  To afford each patient quality time with our providers, please arrive at least 15 minutes before your scheduled appointment time.  With your help, our goal is to use those 15 minutes to complete the necessary work-up to ensure our physicians have the information they need to help with your evaluation and healthcare recommendations.    Effective January 1st, 2014, we ask that you re-schedule your appointment with our physicians should you arrive 10 or more minutes late for your appointment.  We strive to give you quality time with our providers, and arriving late affects you and other patients whose appointments are after yours.    Again, thank you for choosing [facility].  Our hope is that these requests will decrease the amount of time that you wait before being seen by our physicians.       _____________________________________________________________  Should you have questions after your visit to [facility], please contact our office at [phone] between the hours of 8:30 a.m. and 5:00 p.m.  Voicemails left after 4:30 p.m. will not be returned until the following business day.  For prescription refill requests, have your pharmacy contact our office with your prescription refill request.

## 2013-02-14 NOTE — Progress Notes (Signed)
Labs drawn today for cbc/diff,ldh,kllc,mm,b10mic

## 2013-02-15 LAB — BETA 2 MICROGLOBULIN, SERUM: Beta-2 Microglobulin: 2.13 mg/L — ABNORMAL HIGH (ref 1.01–1.73)

## 2013-02-15 LAB — KAPPA/LAMBDA LIGHT CHAINS
Kappa free light chain: 2.32 mg/dL — ABNORMAL HIGH (ref 0.33–1.94)
Kappa, lambda light chain ratio: 1.17 (ref 0.26–1.65)
Lambda free light chains: 1.99 mg/dL (ref 0.57–2.63)

## 2013-02-16 LAB — MULTIPLE MYELOMA PANEL, SERUM
Albumin ELP: 56.3 % (ref 55.8–66.1)
Alpha-1-Globulin: 6 % — ABNORMAL HIGH (ref 2.9–4.9)
Alpha-2-Globulin: 15.5 % — ABNORMAL HIGH (ref 7.1–11.8)
Beta 2: 5.3 % (ref 3.2–6.5)
Beta Globulin: 6 % (ref 4.7–7.2)
Gamma Globulin: 10.9 % — ABNORMAL LOW (ref 11.1–18.8)
IgA: 176 mg/dL (ref 68–379)
IgG (Immunoglobin G), Serum: 712 mg/dL (ref 650–1600)
IgM, Serum: 35 mg/dL — ABNORMAL LOW (ref 41–251)
M-Spike, %: 0.27 g/dL
Total Protein: 6.2 g/dL (ref 6.0–8.3)

## 2013-02-18 ENCOUNTER — Encounter: Payer: Self-pay | Admitting: *Deleted

## 2013-02-18 NOTE — Progress Notes (Signed)
[facility] Psychosocial Distress Screening  Clinical Social Work  Clinical Social Work was referred by distress screening protocol. The patient scored a 7 on the Psychosocial Distress Thermometer which indicates moderate distress. Clinical Social Worker contacted patient by phone to assess for distress and other psychosocial needs. The patient shared he is feeling better today than he has in a long time.  He has been very pleased with his care at the cancer center and plans to follow up with Dr. Sondra Come in February.  The patient has no psychosocial needs at this time. CSW encouraged patient to call with any questions or concerns.   Clinical Social Worker follow up needed: no  If yes, follow up plan:   Polo Riley, MSW, LCSW, OSW-C  Clinical Social Worker  [facility]  [phone]

## 2013-02-20 ENCOUNTER — Encounter: Payer: Self-pay | Admitting: Radiation Oncology

## 2013-02-20 NOTE — Progress Notes (Signed)
  Radiation Oncology         [phone]) [phone] ________________________________  Name: Dean [name] MRN: [id number]  Date: 02/20/2013  DOB: 05/16/1935  End of Treatment Note  Diagnosis:      Left occipital condyle infiltrative lesion, occipital condyle syndrome   Indication for treatment:  Pain and neurologic compromise       Radiation treatment dates:   December 3 through January 8  Site/dose:   Left occipital condyle region, 45 Gy in 25 fractions  Beams/energy:   3 field conformal setup, 10 and 15 MV photons  Narrative: The patient tolerated radiation treatment relatively well.   His pain as well as difficulties with speech and swallowing improved during the course of his treatments.  He did experience some fatigue during the course of his treatment.  Plan: The patient has completed radiation treatment. The patient will return to radiation oncology clinic for routine followup in one month. I advised them to call or return sooner if they have any questions or concerns related to their recovery or treatment.  -----------------------------------  Blair Promise, PhD, MD

## 2013-02-22 DIAGNOSIS — Z961 Presence of intraocular lens: Secondary | ICD-10-CM | POA: Diagnosis not present

## 2013-03-15 ENCOUNTER — Encounter: Payer: Self-pay | Admitting: Oncology

## 2013-03-17 ENCOUNTER — Ambulatory Visit
Admission: RE | Admit: 2013-03-17 | Discharge: 2013-03-17 | Disposition: A | Discharge status: Home / Self Care | Payer: Medicare Other | Source: Ambulatory Visit | Attending: Radiation Oncology | Admitting: Radiation Oncology

## 2013-03-17 ENCOUNTER — Encounter: Payer: Self-pay | Admitting: Radiation Oncology

## 2013-03-17 VITALS — BP 147/71 | HR 83 | Temp 97.6°F | Ht 68.0 in | Wt 262.6 lb

## 2013-03-17 DIAGNOSIS — M899 Disorder of bone, unspecified: Secondary | ICD-10-CM

## 2013-03-17 NOTE — Progress Notes (Signed)
Dean [name] here today for follow up after treatment to his left occipital condyle lesion.  He deneis pain.  He has been having occasional pain in his left neck, shoulder and arm.  He has limited range of motion in his neck.  He is able to turn his head a little bit in each direction.  He reports pain when he leans his head back.  He has not been wearing his cerical collar.  He reports a metallic taste in his mouth.  He also reports a dry mouth.  He denies trouble swallowing.  He denies fatigue but has trouble falling asleep.

## 2013-03-17 NOTE — Progress Notes (Signed)
Radiation Oncology         [phone] ________________________________  Name: Dean [name] MRN: [id number]  Date: 03/17/2013  DOB: Dec 07, 1935  Follow-Up Visit Note  CC: [name], MD  [name], MD  Diagnosis:   Left occipital condyle infiltrative lesion, occipital condyle syndrome   Interval Since Last Radiation:  1  months  Narrative:  The patient returns today for routine follow-up.  Overall the patient is doing much better in terms of his pain. He is also eating better and has better control of his tongue at this time.  Taste is still off. He does notice some stiffness when turning his head to the right.                              ALLERGIES:  is allergic to codeine and morphine and related.  Meds: Current Outpatient Prescriptions  Medication Sig Dispense Refill  . ALPRAZolam (XANAX) 1 MG tablet Take 1 tablet (1 mg total) by mouth 2 (two) times daily as needed. For anxiety and/or sleep  60 tablet  0  . aspirin EC 325 MG tablet Take 325 mg by mouth every 4 (four) hours as needed.      . gabapentin (NEURONTIN) 400 MG capsule Take 1 capsule (400 mg total) by mouth 4 (four) times daily. May take up to five times daily depending on pain in feet  180 capsule  5  . tamsulosin (FLOMAX) 0.4 MG CAPS capsule Take 0.4 mg by mouth every evening.      Marland Kitchen dexamethasone (DECADRON) 4 MG tablet Take 1 tablet (4 mg total) by mouth 2 (two) times daily with a meal.  30 tablet  1  . emollient (BIAFINE) cream Apply topically 2 (two) times daily.      Marland Kitchen HYDROcodone-acetaminophen (NORCO/VICODIN) 5-325 MG per tablet Take 1 tablet by mouth every 6 (six) hours as needed for pain.  30 tablet  0  . oxyCODONE-acetaminophen (PERCOCET) 10-325 MG per tablet Take 1 tablet by mouth every 4 (four) hours as needed for pain.  60 tablet  0  . temazepam (RESTORIL) 15 MG capsule Take 1 capsule (15 mg total) by mouth at bedtime as needed for sleep. May take 2 capsules at once if one is ineffective.   60 capsule  0  . triamcinolone cream (KENALOG) 0.1 % Apply 1 application topically 2 (two) times daily.       No current facility-administered medications for this encounter.    Physical Findings: The patient is in no acute distress. Patient is alert and oriented.  height is 5\' 8"  (1.727 m) and weight is 262 lb 9.6 oz (119.115 kg). His temperature is 97.6 F (36.4 C). His blood pressure is 147/71 and his pulse is 83. His oxygen saturation is 97%. .  No palpable supraclavicular or axillary adenopathy. The lungs are clear to auscultation. The heart has regular rhythm and rate.  The oral cavity is moist without secondary infection. Less deviation of the tongue is noted.  Lab Findings: Lab Results  Component Value Date   WBC 7.2 02/14/2013   HGB 15.8 02/14/2013   HCT 45.3 02/14/2013   MCV 88.5 02/14/2013   PLT 210 02/14/2013      Radiographic Findings: No results found.  Impression:  The patient is recovering from the effects of radiation. Good Palliation of pain  Plan:  Routine followup in 4 months.  ____________________________________ Blair Promise, MD

## 2013-03-18 ENCOUNTER — Telehealth: Payer: Self-pay | Admitting: Oncology

## 2013-03-18 NOTE — Telephone Encounter (Signed)
Called Dr. Idamae Lusher office and spoke to [place].  She is going to call Hendryx Ricke to let him know the results of his myeloma panel.

## 2013-05-09 ENCOUNTER — Encounter (HOSPITAL_COMMUNITY): Payer: Medicare Other | Attending: Hematology and Oncology

## 2013-05-09 DIAGNOSIS — R5383 Other fatigue: Secondary | ICD-10-CM | POA: Diagnosis not present

## 2013-05-09 DIAGNOSIS — R5381 Other malaise: Secondary | ICD-10-CM | POA: Diagnosis not present

## 2013-05-09 DIAGNOSIS — M949 Disorder of cartilage, unspecified: Secondary | ICD-10-CM | POA: Diagnosis not present

## 2013-05-09 DIAGNOSIS — M899 Disorder of bone, unspecified: Secondary | ICD-10-CM | POA: Diagnosis not present

## 2013-05-09 LAB — CBC WITH DIFFERENTIAL/PLATELET
Basophils Absolute: 0 10*3/uL (ref 0.0–0.1)
Basophils Relative: 0 % (ref 0–1)
Eosinophils Absolute: 0.1 10*3/uL (ref 0.0–0.7)
Eosinophils Relative: 1 % (ref 0–5)
HCT: 44.8 % (ref 39.0–52.0)
Hemoglobin: 14.9 g/dL (ref 13.0–17.0)
Lymphocytes Relative: 38 % (ref 12–46)
Lymphs Abs: 2.9 10*3/uL (ref 0.7–4.0)
MCH: 29.3 pg (ref 26.0–34.0)
MCHC: 33.3 g/dL (ref 30.0–36.0)
MCV: 88.2 fL (ref 78.0–100.0)
Monocytes Absolute: 0.7 10*3/uL (ref 0.1–1.0)
Monocytes Relative: 9 % (ref 3–12)
Neutro Abs: 3.9 10*3/uL (ref 1.7–7.7)
Neutrophils Relative %: 52 % (ref 43–77)
Platelets: 180 10*3/uL (ref 150–400)
RBC: 5.08 MIL/uL (ref 4.22–5.81)
RDW: 13.4 % (ref 11.5–15.5)
WBC: 7.6 10*3/uL (ref 4.0–10.5)

## 2013-05-09 LAB — LACTATE DEHYDROGENASE: LDH: 148 U/L (ref 94–250)

## 2013-05-09 NOTE — Progress Notes (Signed)
Labs drawn today for b21mic,cbc/diff,kllc,ldh,mm

## 2013-05-10 LAB — KAPPA/LAMBDA LIGHT CHAINS
Kappa free light chain: 2.29 mg/dL — ABNORMAL HIGH (ref 0.33–1.94)
Kappa, lambda light chain ratio: 1.52 (ref 0.26–1.65)
Lambda free light chains: 1.51 mg/dL (ref 0.57–2.63)

## 2013-05-11 LAB — MULTIPLE MYELOMA PANEL, SERUM
Albumin ELP: 58.4 % (ref 55.8–66.1)
Alpha-1-Globulin: 5.9 % — ABNORMAL HIGH (ref 2.9–4.9)
Alpha-2-Globulin: 13.1 % — ABNORMAL HIGH (ref 7.1–11.8)
Beta 2: 4 % (ref 3.2–6.5)
Beta Globulin: 6.8 % (ref 4.7–7.2)
Gamma Globulin: 11.8 % (ref 11.1–18.8)
IgA: 149 mg/dL (ref 68–379)
IgG (Immunoglobin G), Serum: 709 mg/dL (ref 650–1600)
IgM, Serum: 31 mg/dL — ABNORMAL LOW (ref 41–251)
M-Spike, %: 0.39 g/dL
Total Protein: 6.1 g/dL (ref 6.0–8.3)

## 2013-05-11 LAB — BETA 2 MICROGLOBULIN, SERUM: Beta-2 Microglobulin: 2.56 mg/L — ABNORMAL HIGH (ref <=2.51)

## 2013-05-16 ENCOUNTER — Encounter (HOSPITAL_BASED_OUTPATIENT_CLINIC_OR_DEPARTMENT_OTHER): Payer: Medicare Other

## 2013-05-16 ENCOUNTER — Encounter (HOSPITAL_COMMUNITY): Payer: Self-pay

## 2013-05-16 DIAGNOSIS — M899 Disorder of bone, unspecified: Secondary | ICD-10-CM

## 2013-05-16 DIAGNOSIS — R5383 Other fatigue: Secondary | ICD-10-CM

## 2013-05-16 DIAGNOSIS — K219 Gastro-esophageal reflux disease without esophagitis: Secondary | ICD-10-CM | POA: Diagnosis not present

## 2013-05-16 DIAGNOSIS — R5381 Other malaise: Secondary | ICD-10-CM | POA: Diagnosis not present

## 2013-05-16 DIAGNOSIS — R531 Weakness: Secondary | ICD-10-CM | POA: Insufficient documentation

## 2013-05-16 DIAGNOSIS — M949 Disorder of cartilage, unspecified: Secondary | ICD-10-CM

## 2013-05-16 LAB — TSH: TSH: 0.653 u[IU]/mL (ref 0.350–4.500)

## 2013-05-16 NOTE — Patient Instructions (Signed)
[facility] Discharge Instructions  RECOMMENDATIONS MADE BY THE CONSULTANT AND ANY TEST RESULTS WILL BE SENT TO YOUR REFERRING PHYSICIAN. You will see the doctor and have repeat lab work in 6 months. You will need MRI that we will schedule.  Thank you for choosing [facility] to provide your oncology and hematology care.  To afford each patient quality time with our providers, please arrive at least 15 minutes before your scheduled appointment time.  With your help, our goal is to use those 15 minutes to complete the necessary work-up to ensure our physicians have the information they need to help with your evaluation and healthcare recommendations.    Effective January 1st, 2014, we ask that you re-schedule your appointment with our physicians should you arrive 10 or more minutes late for your appointment.  We strive to give you quality time with our providers, and arriving late affects you and other patients whose appointments are after yours.    Again, thank you for choosing [facility].  Our hope is that these requests will decrease the amount of time that you wait before being seen by our physicians.       _____________________________________________________________  Should you have questions after your visit to [facility], please contact our office at [phone] between the hours of 8:30 a.m. and 5:00 p.m.  Voicemails left after 4:30 p.m. will not be returned until the following business day.  For prescription refill requests, have your pharmacy contact our office with your prescription refill request.

## 2013-05-16 NOTE — Progress Notes (Signed)
Labs drawn today for tsh

## 2013-05-16 NOTE — Progress Notes (Signed)
[facility]  OFFICE PROGRESS NOTE  Dean Fraction, MD [address] [place] [address]  DIAGNOSIS: Skull lesion - Plan: MR Brain W Wo Contrast  GERD (gastroesophageal reflux disease)  Fatigue - Plan: TSH  Chief Complaint  Patient presents with  . Occipital condyle syndrome    Status post radiotherapy    CURRENT THERAPY: Definitive radiotherapy 01/05/2013 through 02/10/2013 to the left occipital condyle region, 45 gray in 25 fractions. No tissue diagnosis.  INTERVAL HISTORY: Dean [name] 78 y.o. male returns for followup of left occipital condyle syndrome 3 months after definitive therapy with external beam radiotherapy, receiving 45 gray in 25 fractions from 01/05/2013 through 02/10/2013.  Feels tired and weak he also has fullness involving the right ear. He has decreased hearing acuity in the left ear. He completed external beam radiotherapy on 02/10/2013 to an area involving the left occipital condyle. He denies abdominal pain, nausea, vomiting, diarrhea, constipation, melena, hematochezia, hematuria, but does have neck discomfort without any headache at this time. There is limited range of motion on left and right lateral rotation as well as flexion and extension.  MEDICAL HISTORY: Past Medical History  Diagnosis Date  . Neuropathy   . BPH (benign prostatic hyperplasia)   . GERD (gastroesophageal reflux disease)   . H/O ETOH abuse   . Skull lesion     Left occipital condyle  . History of radiation therapy 01/05/13-02/10/13    45 gray to left occipital condyle region    INTERIM HISTORY: has Neuropathy; BPH (benign prostatic hyperplasia); GERD (gastroesophageal reflux disease); H/O ETOH abuse; Skull lesion; Thrush; Neck pain; and Fatigue on his problem list.    ALLERGIES:  is allergic to codeine and morphine and related.  MEDICATIONS: has a current medication list which includes the following prescription(s): alprazolam,  aspirin-salicylamide-caffeine, gabapentin, and tamsulosin.  SURGICAL HISTORY:  Past Surgical History  Procedure Laterality Date  . Cyst excision  1959    tail bone    FAMILY HISTORY: family history includes Diabetes in his mother; Hypertension in his father; Stroke in his mother.  SOCIAL HISTORY:  reports that he has never smoked. He has never used smokeless tobacco. He reports that he does not drink alcohol or use illicit drugs.  REVIEW OF SYSTEMS:  Other than that discussed above is noncontributory.  PHYSICAL EXAMINATION: ECOG PERFORMANCE STATUS: 1 - Symptomatic but completely ambulatory  There were no vitals taken for this visit.  GENERAL:alert, no distress and comfortable. Moderately obese. SKIN: skin color, texture, turgor are normal, no rashes or significant lesions. Hyperpigmentation of the left ear. EYES: PERLA; Conjunctiva are pink and non-injected, sclera clear SINUSES: No redness or tenderness over maxillary or ethmoid sinuses. No sinus tenderness. OROPHARYNX:no exudate, no erythema on lips, buccal mucosa, or tongue. NECK: supple, thyroid normal size, non-tender, without nodularity. No masses CHEST: Increased AP diameter with no breast masses. LYMPH:  no palpable lymphadenopathy in the cervical, axillary or inguinal LUNGS: clear to auscultation and percussion with normal breathing effort HEART: regular rate & rhythm and no murmurs. ABDOMEN:abdomen soft, non-tender and normal bowel sounds MUSCULOSKELETAL:no cyanosis of digits and no clubbing. Range of motion normal.  NEURO: alert & oriented x 3 with fluent speech, no focal motor/sensory deficits. Decreased hearing acuity on the left ear   LABORATORY DATA: Infusion on 05/09/2013  Component Date Value Ref Range Status  . Beta-2 Microglobulin 05/09/2013 2.56* <=2.51 mg/L Final   Performed at  [facility]  . WBC 05/09/2013 7.6  4.0 - 10.5 K/uL Final  . RBC 05/09/2013 5.08  4.22 - 5.81 MIL/uL Final  . Hemoglobin  05/09/2013 14.9  13.0 - 17.0 g/dL Final  . HCT 05/09/2013 44.8  39.0 - 52.0 % Final  . MCV 05/09/2013 88.2  78.0 - 100.0 fL Final  . MCH 05/09/2013 29.3  26.0 - 34.0 pg Final  . MCHC 05/09/2013 33.3  30.0 - 36.0 g/dL Final  . RDW 05/09/2013 13.4  11.5 - 15.5 % Final  . Platelets 05/09/2013 180  150 - 400 K/uL Final  . Neutrophils Relative % 05/09/2013 52  43 - 77 % Final  . Neutro Abs 05/09/2013 3.9  1.7 - 7.7 K/uL Final  . Lymphocytes Relative 05/09/2013 38  12 - 46 % Final  . Lymphs Abs 05/09/2013 2.9  0.7 - 4.0 K/uL Final  . Monocytes Relative 05/09/2013 9  3 - 12 % Final  . Monocytes Absolute 05/09/2013 0.7  0.1 - 1.0 K/uL Final  . Eosinophils Relative 05/09/2013 1  0 - 5 % Final  . Eosinophils Absolute 05/09/2013 0.1  0.0 - 0.7 K/uL Final  . Basophils Relative 05/09/2013 0  0 - 1 % Final  . Basophils Absolute 05/09/2013 0.0  0.0 - 0.1 K/uL Final  . Kappa free light chain 05/09/2013 2.29* 0.33 - 1.94 mg/dL Final  . Lamda free light chains 05/09/2013 1.51  0.57 - 2.63 mg/dL Final  . Kappa, lamda light chain ratio 05/09/2013 1.52  0.26 - 1.65 Final   Performed at [facility]  . LDH 05/09/2013 148  94 - 250 U/L Final  . Total Protein 05/09/2013 6.1  6.0 - 8.3 g/dL Final  . Albumin ELP 05/09/2013 58.4  55.8 - 66.1 % Final  . Alpha-1-Globulin 05/09/2013 5.9* 2.9 - 4.9 % Final  . Alpha-2-Globulin 05/09/2013 13.1* 7.1 - 11.8 % Final  . Beta Globulin 05/09/2013 6.8  4.7 - 7.2 % Final  . Beta 2 05/09/2013 4.0  3.2 - 6.5 % Final  . Gamma Globulin 05/09/2013 11.8  11.1 - 18.8 % Final  . M-Spike, % 05/09/2013 0.39   Final  . SPE Interp. 05/09/2013 (NOTE)   Final   Comment: A restricted band consistent with monoclonal protein is present.                          The monoclonal protein peak accounts for 0.39 g/dL of the total                          0.72 g/dL of protein in the gamma region.                          Results are consistent with SPE performed on 02/15/2013  Reviewed by                            Odis Hollingshead, MD, PhD, FCAP (Electronic Signature on File)  . Comment 05/09/2013 (NOTE)   Final   Comment: ---------------                          Serum protein electrophoresis is a useful screening procedure in the  detection of various pathophysiologic states such as inflammation,                          gammopathies, protein loss and other dysproteinemias.  Immunofixation                          electrophoresis (IFE) is a more sensitive technique for the                          identification of M-proteins found in patients with monoclonal                          gammopathy of unknown significance (MGUS), amyloidosis, early or                          treated myeloma or macroglobulinemia, solitary plasmacytoma or                          extramedullary plasmacytoma.  . IgG (Immunoglobin G), Serum 05/09/2013 709  650 - 1600 mg/dL Final  . IgA 05/09/2013 149  68 - 379 mg/dL Final  . IgM, Serum 05/09/2013 31* 41 - 251 mg/dL Final  . Immunofix Electr Int 05/09/2013 (NOTE)   Final   Comment: Monoclonal IgG kappa protein is present.                          Area of slightly restricted mobility in the IgG and Lambda lanes.                          Suggest repeat in 6-8 months, if clinically indicated.                          Reviewed by Odis Hollingshead, MD, PhD, FCAP (Electronic Signature on                          File)                          Performed at [facility]: No tissue diagnosis for lytic lesion involving the left occipital condyle  Urinalysis    Component Value Date/Time   COLORURINE YELLOW 11/29/2012 2005   APPEARANCEUR CLEAR 11/29/2012 2005   LABSPEC 1.010 11/29/2012 2005   PHURINE 5.5 11/29/2012 2005   GLUCOSEU NEGATIVE 11/29/2012 2005   HGBUR NEGATIVE 11/29/2012 2005   [place] NEGATIVE 11/29/2012 2005   [place] 11/29/2012 2005   PROTEINUR NEGATIVE 11/29/2012 2005    UROBILINOGEN 0.2 11/29/2012 2005   NITRITE NEGATIVE 11/29/2012 2005   LEUKOCYTESUR NEGATIVE 11/29/2012 2005    RADIOGRAPHIC STUDIES: No results found.  ASSESSMENT:  #1. 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 #2. Worsening fatigue, possible hypothyroidism vs OSAS #3. Morbid obesity. #4. Gastroesophageal reflux disease, controlled. #5. Benign prostatic hypertrophy, asymptomatic.   PLAN:  #1. Check TSH today. If abnormal, appropriate implementation will be taken. #2. MRI of the skull and compared to previous study. #3. Possible sleep study to evaluate obstructive sleep apnea syndrome. Patient refuses to have a scheduled at this time. #4. Radiotherapy  followup in June 2015. #5. Followup here in 6 months with myeloma workup.   All questions were answered. The patient knows to call the clinic with any problems, questions or concerns. We can certainly see the patient much sooner if necessary.   I spent 25 minutes counseling the patient face to face. The total time spent in the appointment was 30 minutes.    Farrel Gobble, MD 05/16/2013 11:27 AM

## 2013-05-17 ENCOUNTER — Telehealth (HOSPITAL_COMMUNITY): Payer: Self-pay

## 2013-05-17 NOTE — Telephone Encounter (Signed)
Message copied by Mellissa Kohut on Tue May 17, 2013  6:20 PM ------      Message from: Farrel Gobble A      Created: Tue May 17, 2013  7:47 AM       Please notify him or his daughter that thyroid function was normal.  He needs to have sleep study done but refused at time of appointment on 05/16/2013.  Ask if he would reconsider and I will order.Thanks. Dr.F ------

## 2013-05-17 NOTE — Telephone Encounter (Signed)
Patient notified regarding normal thyroid function tests and will let us know if he will reconsider having the sleep study performed.

## 2013-05-23 ENCOUNTER — Other Ambulatory Visit (HOSPITAL_COMMUNITY): Payer: Medicare Other

## 2013-05-25 ENCOUNTER — Ambulatory Visit
Admission: RE | Admit: 2013-05-25 | Discharge: 2013-05-25 | Disposition: A | Discharge status: Home / Self Care | Payer: Medicare Other | Source: Ambulatory Visit | Attending: Hematology and Oncology | Admitting: Hematology and Oncology

## 2013-05-25 DIAGNOSIS — M899 Disorder of bone, unspecified: Secondary | ICD-10-CM

## 2013-05-25 DIAGNOSIS — C9000 Multiple myeloma not having achieved remission: Secondary | ICD-10-CM | POA: Diagnosis not present

## 2013-05-25 MED ORDER — GADOBENATE DIMEGLUMINE 529 MG/ML IV SOLN
20.0000 mL | Freq: Once | INTRAVENOUS | Status: AC | PRN
  Administered 2013-05-25: 20 mL via INTRAVENOUS

## 2013-05-26 ENCOUNTER — Encounter (HOSPITAL_COMMUNITY): Payer: Self-pay

## 2013-05-26 ENCOUNTER — Encounter (HOSPITAL_BASED_OUTPATIENT_CLINIC_OR_DEPARTMENT_OTHER): Payer: Medicare Other

## 2013-05-26 VITALS — BP 144/73 | HR 78 | Temp 97.4°F | Resp 20 | Wt 260.7 lb

## 2013-05-26 DIAGNOSIS — M899 Disorder of bone, unspecified: Secondary | ICD-10-CM

## 2013-05-26 DIAGNOSIS — R5381 Other malaise: Secondary | ICD-10-CM

## 2013-05-26 DIAGNOSIS — M949 Disorder of cartilage, unspecified: Secondary | ICD-10-CM

## 2013-05-26 DIAGNOSIS — K219 Gastro-esophageal reflux disease without esophagitis: Secondary | ICD-10-CM

## 2013-05-26 DIAGNOSIS — R5383 Other fatigue: Secondary | ICD-10-CM | POA: Diagnosis not present

## 2013-05-26 NOTE — Patient Instructions (Signed)
[facility] Discharge Instructions  RECOMMENDATIONS MADE BY THE CONSULTANT AND ANY TEST RESULTS WILL BE SENT TO YOUR REFERRING PHYSICIAN.  EXAM FINDINGS BY THE PHYSICIAN TODAY AND SIGNS OR SYMPTOMS TO REPORT TO CLINIC OR PRIMARY PHYSICIAN: Exam and findings as discussed by Dr. Barnet Glasgow. Labs are stable.  Report, fevers, night sweats, unexplained weight loss, etc.  MEDICATIONS PRESCRIBED:  none  INSTRUCTIONS/FOLLOW-UP: Follow-up in September with blood work and office visit.  Thank you for choosing [facility] to provide your oncology and hematology care.  To afford each patient quality time with our providers, please arrive at least 15 minutes before your scheduled appointment time.  With your help, our goal is to use those 15 minutes to complete the necessary work-up to ensure our physicians have the information they need to help with your evaluation and healthcare recommendations.    Effective January 1st, 2014, we ask that you re-schedule your appointment with our physicians should you arrive 10 or more minutes late for your appointment.  We strive to give you quality time with our providers, and arriving late affects you and other patients whose appointments are after yours.    Again, thank you for choosing [facility].  Our hope is that these requests will decrease the amount of time that you wait before being seen by our physicians.       _____________________________________________________________  Should you have questions after your visit to [facility], please contact our office at [phone] between the hours of 8:30 a.m. and 5:00 p.m.  Voicemails left after 4:30 p.m. will not be returned until the following business day.  For prescription refill requests, have your pharmacy contact our office with your prescription refill request.

## 2013-05-26 NOTE — Progress Notes (Signed)
     [facility]  OFFICE PROGRESS NOTE  PICKARD,WARREN [name], MD [address] [place] [address]  DIAGNOSIS: No diagnosis found.  Chief Complaint  Patient presents with  . Occipital condyle syndrome    CURRENT THERAPY: Definitive radiotherapy 01/05/2013 through 02/10/2013 to left occipital condyle region, 45 gray in 25 fractions, no tissue diagnosis.  INTERVAL HISTORY: Dean [name] 78 y.o. male returns for followup after repeat MRI having received definitive radiotherapy for left occipital condyle syndrome.  He continues to have a hearing deficit on the left side but normally has any headache or sore throat. He is sleeping well since getting off dexamethasone. He denies any fever, night sweats, but has had nasal allergy symptoms. He denies abdominal pain, diarrhea, constipation, lower extremity swelling or redness, PND, orthopnea, palpitations, and has asked he been using a chain saw and has developed some left shoulder discomfort as a result. He denies any skin rash, seizures, or incontinence.  MEDICAL HISTORY: Past Medical History  Diagnosis Date  . Neuropathy   . BPH (benign prostatic hyperplasia)   . GERD (gastroesophageal reflux disease)   . H/O ETOH abuse   . Skull lesion     Left occipital condyle  . History of radiation therapy 01/05/13-02/10/13    45 gray to left occipital condyle region    INTERIM HISTORY: has Neuropathy; BPH (benign prostatic hyperplasia); GERD (gastroesophageal reflux disease); H/O ETOH abuse; Skull lesion; Thrush; Neck pain; and Fatigue on his problem list.    ALLERGIES:  is allergic to codeine and morphine and related.  MEDICATIONS: has a current medication list which includes the following prescription(s): alprazolam, aspirin-salicylamide-caffeine, gabapentin, and tamsulosin.  SURGICAL HISTORY:  Past Surgical History  Procedure Laterality Date  . Cyst excision  1959    tail bone    FAMILY  HISTORY: family history includes Diabetes in his mother; Hypertension in his father; Stroke in his mother.  SOCIAL HISTORY:  reports that he has never smoked. He has never used smokeless tobacco. He reports that he does not drink alcohol or use illicit drugs.  REVIEW OF SYSTEMS:  Other than that discussed above is noncontributory.  PHYSICAL EXAMINATION: ECOG PERFORMANCE STATUS: 1 - Symptomatic but completely ambulatory  Blood pressure 144/73, pulse 78, temperature 97.4 F (36.3 C), temperature source Oral, resp. rate 20, weight 260 lb 11.2 oz (118.253 kg).  GENERAL:alert, no distress and comfortable SKIN: skin color, texture, turgor are normal, no rashes or significant lesions EYES: PERLA; Conjunctiva are pink and non-injected, sclera clear SINUSES: No redness or tenderness over maxillary or ethmoid sinuses. Bilateral rhinorrhea. OROPHARYNX:no exudate, no erythema on lips, buccal mucosa, or tongue. NECK: supple, thyroid normal size, non-tender, without nodularity. No masses CHEST: Increased AP diameter with no gynecomastia. LYMPH:  no palpable lymphadenopathy in the cervical, axillary or inguinal LUNGS: clear to auscultation and percussion with normal breathing effort HEART: regular rate & rhythm and no murmurs. ABDOMEN:abdomen soft, non-tender and normal bowel sounds. Obese and soft with no organomegaly, ascites, or CVA tenderness. MUSCULOSKELETAL:no cyanosis of digits and no clubbing. Range of motion normal.  NEURO: alert & oriented x 3 with fluent speech, no focal motor/sensory deficits. Decreased hearing acuity on the left.   LABORATORY DATA: Infusion on 05/16/2013  Component Date Value Ref Range Status  . TSH 05/16/2013 0.653  0.350 - 4.500 uIU/mL Final   Comment: Please note change in reference range.                            Performed at [facility]  Infusion on 05/09/2013  Component Date Value Ref Range Status  . Beta-2 Microglobulin 05/09/2013 2.56* <=2.51 mg/L  Final   Performed at [facility]  . WBC 05/09/2013 7.6  4.0 - 10.5 K/uL Final  . RBC 05/09/2013 5.08  4.22 - 5.81 MIL/uL Final  . Hemoglobin 05/09/2013 14.9  13.0 - 17.0 g/dL Final  . HCT 05/09/2013 44.8  39.0 - 52.0 % Final  . MCV 05/09/2013 88.2  78.0 - 100.0 fL Final  . MCH 05/09/2013 29.3  26.0 - 34.0 pg Final  . MCHC 05/09/2013 33.3  30.0 - 36.0 g/dL Final  . RDW 05/09/2013 13.4  11.5 - 15.5 % Final  . Platelets 05/09/2013 180  150 - 400 K/uL Final  . Neutrophils Relative % 05/09/2013 52  43 - 77 % Final  . Neutro Abs 05/09/2013 3.9  1.7 - 7.7 K/uL Final  . Lymphocytes Relative 05/09/2013 38  12 - 46 % Final  . Lymphs Abs 05/09/2013 2.9  0.7 - 4.0 K/uL Final  . Monocytes Relative 05/09/2013 9  3 - 12 % Final  . Monocytes Absolute 05/09/2013 0.7  0.1 - 1.0 K/uL Final  . Eosinophils Relative 05/09/2013 1  0 - 5 % Final  . Eosinophils Absolute 05/09/2013 0.1  0.0 - 0.7 K/uL Final  . Basophils Relative 05/09/2013 0  0 - 1 % Final  . Basophils Absolute 05/09/2013 0.0  0.0 - 0.1 K/uL Final  . Kappa free light chain 05/09/2013 2.29* 0.33 - 1.94 mg/dL Final  . Lamda free light chains 05/09/2013 1.51  0.57 - 2.63 mg/dL Final  . Kappa, lamda light chain ratio 05/09/2013 1.52  0.26 - 1.65 Final   Performed at [facility]  . LDH 05/09/2013 148  94 - 250 U/L Final  . Total Protein 05/09/2013 6.1  6.0 - 8.3 g/dL Final  . Albumin ELP 05/09/2013 58.4  55.8 - 66.1 % Final  . Alpha-1-Globulin 05/09/2013 5.9* 2.9 - 4.9 % Final  . Alpha-2-Globulin 05/09/2013 13.1* 7.1 - 11.8 % Final  . Beta Globulin 05/09/2013 6.8  4.7 - 7.2 % Final  . Beta 2 05/09/2013 4.0  3.2 - 6.5 % Final  . Gamma Globulin 05/09/2013 11.8  11.1 - 18.8 % Final  . M-Spike, % 05/09/2013 0.39   Final  . SPE Interp. 05/09/2013 (NOTE)   Final   Comment: A restricted band consistent with monoclonal protein is present.                          The monoclonal protein peak accounts for 0.39 g/dL of the total                           0.72 g/dL of protein in the gamma region.                          Results are consistent with SPE performed on 02/15/2013  Reviewed by                          Janice J. Hessling, MD, PhD, FCAP (Electronic Signature on File)  . Comment 05/09/2013 (NOTE)   Final   Comment: ---------------                            Serum protein electrophoresis is a useful screening procedure in the                          detection of various pathophysiologic states such as inflammation,                          gammopathies, protein loss and other dysproteinemias.  Immunofixation                          electrophoresis (IFE) is a more sensitive technique for the                          identification of M-proteins found in patients with monoclonal                          gammopathy of unknown significance (MGUS), amyloidosis, early or                          treated myeloma or macroglobulinemia, solitary plasmacytoma or                          extramedullary plasmacytoma.  . IgG (Immunoglobin G), Serum 05/09/2013 709  650 - 1600 mg/dL Final  . IgA 05/09/2013 149  68 - 379 mg/dL Final  . IgM, Serum 05/09/2013 31* 41 - 251 mg/dL Final  . Immunofix Electr Int 05/09/2013 (NOTE)   Final   Comment: Monoclonal IgG kappa protein is present.                          Area of slightly restricted mobility in the IgG and Lambda lanes.                          Suggest repeat in 6-8 months, if clinically indicated.                          Reviewed by Janice J. Hessling, MD, PhD, FCAP (Electronic Signature on                          File)                          Performed at [facility]    PATHOLOGY: No tissue diagnosis  Urinalysis    Component Value Date/Time   COLORURINE YELLOW 11/29/2012 2005   APPEARANCEUR CLEAR 11/29/2012 2005   LABSPEC 1.010 11/29/2012 2005   PHURINE 5.5 11/29/2012 2005   GLUCOSEU NEGATIVE 11/29/2012 2005   HGBUR NEGATIVE 11/29/2012 2005   BILIRUBINUR  NEGATIVE 11/29/2012 2005   KETONESUR NEGATIVE 11/29/2012 2005   PROTEINUR NEGATIVE 11/29/2012 2005   UROBILINOGEN 0.2 11/29/2012 2005   NITRITE NEGATIVE 11/29/2012 2005   LEUKOCYTESUR NEGATIVE 11/29/2012 2005    RADIOGRAPHIC STUDIES: Mr Brain W Wo Contrast  05/25/2013   CLINICAL DATA:  Multiple myeloma. Follow-up left occipital condyle lesion status post radiation therapy.  BUN and creatinine were obtained on site at [facility] at  [address]  Results:  BUN 13 mg/dL,  Creatinine 0.8 mg/dL.  EXAM: MRI HEAD WITHOUT AND   WITH CONTRAST  TECHNIQUE: Multiplanar, multiecho pulse sequences of the brain and surrounding structures were obtained without and with intravenous contrast.  CONTRAST:  73m MULTIHANCE GADOBENATE DIMEGLUMINE 529 MG/ML IV SOLN  COMPARISON:  Brain MRI 11/30/2012  FINDINGS: Incidental note is made of a partially empty sella, unchanged. There is no evidence of acute intracranial hemorrhage or acute infarct. Small, scattered foci of T2 hyperintensity in the subcortical and deep cerebral white matter bilaterally do not appear significantly changed and are nonspecific but compatible with mild chronic small vessel ischemic disease. There is no midline shift or extra-axial fluid collection. There is no abnormal brain parenchymal or meningeal enhancement. There is mild to moderate cerebral atrophy.  Infiltrative lesion involving the left occipital condyle has significantly decreased in size. There is residual precontrast T1 marrow hypointensity, however osseous expansion has decreased. Associated diffusion-weighted signal abnormality has essentially resolved. There is a new left mastoid effusion, likely related to radiation therapy. Paranasal sinuses are clear. Prior bilateral cataract surgery is noted. Major intracranial vascular flow voids are preserved.  IMPRESSION: 1. Interval decreased size of left occipital condyle bone lesion. 2. New left mastoid effusion, likely related to  interval radiation therapy. 3. No evidence of acute intracranial abnormality.   Electronically Signed   By: ALogan Bores  On: 05/25/2013 12:31    ASSESSMENT:  #1. 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  #2. Worsening fatigue, possible hypothyroidism vs OSAS, along with sleep difficulty with normal thyroid function. #3. Morbid obesity.  #4. Gastroesophageal reflux disease, controlled.  #5. Benign prostatic hypertrophy, asymptomatic    PLAN:  #1. Continue watchful expectation. #2. Followup with radiotherapy in June 2015. #3. Followup in September 2015 with CBC, chem profile, myeloma panel, light chains. #4. Patient was told to call should any new symptoms occur that are troublesome and persistent so that evaluation can be accomplished.   All questions were answered. The patient knows to call the clinic with any problems, questions or concerns. We can certainly see the patient much sooner if necessary.   I spent 25 minutes counseling the patient face to face. The total time spent in the appointment was 30 minutes.    GFarrel Gobble MD 05/26/2013 3:08 PM  DISCLAIMER:  This note was dictated with voice recognition software.  Similar sounding words can inadvertently be transcribed inaccurately and may not be corrected upon review.

## 2013-06-30 ENCOUNTER — Other Ambulatory Visit: Payer: Self-pay | Admitting: *Deleted

## 2013-06-30 MED ORDER — TAMSULOSIN HCL 0.4 MG PO CAPS: 0.4000 mg | ORAL_CAPSULE | Freq: Every evening | ORAL | Status: DC

## 2013-06-30 NOTE — Telephone Encounter (Signed)
Refill appropriate and filled per protocol. 

## 2013-07-14 ENCOUNTER — Ambulatory Visit: Admission: RE | Admit: 2013-07-14 | Payer: Medicare Other | Source: Ambulatory Visit | Admitting: Radiation Oncology

## 2013-08-01 ENCOUNTER — Encounter: Payer: Self-pay | Admitting: Radiation Oncology

## 2013-08-01 ENCOUNTER — Ambulatory Visit
Admission: RE | Admit: 2013-08-01 | Discharge: 2013-08-01 | Disposition: A | Discharge status: Home / Self Care | Payer: Medicare Other | Source: Ambulatory Visit | Attending: Radiation Oncology | Admitting: Radiation Oncology

## 2013-08-01 VITALS — BP 133/75 | HR 96 | Temp 98.3°F | Ht 68.0 in | Wt 249.0 lb

## 2013-08-01 DIAGNOSIS — C7951 Secondary malignant neoplasm of bone: Secondary | ICD-10-CM | POA: Diagnosis not present

## 2013-08-01 DIAGNOSIS — C7952 Secondary malignant neoplasm of bone marrow: Secondary | ICD-10-CM | POA: Diagnosis not present

## 2013-08-01 DIAGNOSIS — M899 Disorder of bone, unspecified: Secondary | ICD-10-CM

## 2013-08-01 MED ORDER — GABAPENTIN 400 MG PO CAPS: 400.0000 mg | ORAL_CAPSULE | Freq: Four times a day (QID) | ORAL | Status: DC

## 2013-08-01 MED ORDER — ALPRAZOLAM 1 MG PO TABS: 1.0000 mg | ORAL_TABLET | Freq: Two times a day (BID) | ORAL | Status: DC | PRN

## 2013-08-01 NOTE — Progress Notes (Signed)
  Radiation Oncology         [phone] ________________________________  Name: Dean [name] MRN: [id number]  Date: 08/01/2013  DOB: 1935/10/18  Follow-Up Visit Note  CC: Dean Fraction, MD  Dean Frizzle, MD  Diagnosis:   Left occipital condyle infiltrative lesion, occipital condyle syndrome   Interval Since Last Radiation:  5  months  Narrative:  The patient returns today for routine follow-up.  Since his last followup he did undergo an MRI of the brain showing significant improvement in the left occipital infiltrative lesion. Patient continues to have good palliation of his pain in this area. He does admit to poor hearing out of his left ear and some taste and smell problems which have helped him to lose some weight. He did have refill on his Xanax and gabapentin today.  He has no difficulty in chewing food.                       ALLERGIES:  is allergic to codeine and morphine and related.  Meds: Current Outpatient Prescriptions  Medication Sig Dispense Refill  . ALPRAZolam (XANAX) 1 MG tablet Take 1 tablet (1 mg total) by mouth 2 (two) times daily as needed. For anxiety and/or sleep  60 tablet  0  . Aspirin-Salicylamide-Caffeine (BC HEADACHE POWDER PO) Take 1 packet by mouth 2 (two) times daily as needed.      . gabapentin (NEURONTIN) 400 MG capsule Take 1 capsule (400 mg total) by mouth 4 (four) times daily. May take up to five times daily depending on pain in feet  180 capsule  5  . tamsulosin (FLOMAX) 0.4 MG CAPS capsule Take 1 capsule (0.4 mg total) by mouth every evening.  30 capsule  3   No current facility-administered medications for this encounter.    Physical Findings: The patient is in no acute distress. Patient is alert and oriented.  height is 5\' 8"  (1.727 m) and weight is 249 lb (112.946 kg). His oral temperature is 98.3 F (36.8 C). His blood pressure is 133/75 and his pulse is 96. . The pupils are equal round and reactive to light. The extraocular  eye movements are intact. The tongue is midline. No mucosal lesions are noted in the oral cavity. No palpable adenopathy in the cervical or supraclavicular regions. The lungs are clear. The heart has a regular rhythm and rate.  Palpation along the left occipital area reveals no mass or point tenderness  Lab Findings: Lab Results  Component Value Date   WBC 7.6 05/09/2013   HGB 14.9 05/09/2013   HCT 44.8 05/09/2013   MCV 88.2 05/09/2013   PLT 180 05/09/2013      Radiographic Findings: No results found.  Impression:  The patient continues to do well clinically and on recent imaging studies  Plan:  Routine followup in 6 months.  ____________________________________ Dean Promise, MD

## 2013-08-01 NOTE — Progress Notes (Signed)
Dean [name] here for follow up after treatment to his left occipital condyle region.  He reports pain at a 1/10 in the sides of his neck and when he tilts his head back. For the pain, he is taking gabapentin up to 5 times a day and also BC Headache Powder.   He reports a dry mouth.  He reports that he can taste sweat and sour foods.  He reports not being able to smell.  He reports that he has lost weight because foods have no taste to him.  He has lost 11 lbs since 05/26/13.  He reports a limited range of motion when he turns his head to either side with the right being worse than the left.  He reports hearing a crackling in his neck when he leans his head back.  He has full movement of his tongue and can eat on either side of his mouth.  He reports hair loss around the back of his hair line.  He reports hearing loss in his left ear.  He reports feeling weak.  He is sleeping OK.  He is requesting a refill on xanax and gabapentin.

## 2013-09-05 ENCOUNTER — Encounter: Payer: Self-pay | Admitting: Family Medicine

## 2013-09-05 ENCOUNTER — Ambulatory Visit (INDEPENDENT_AMBULATORY_CARE_PROVIDER_SITE_OTHER): Payer: Medicare Other | Admitting: Family Medicine

## 2013-09-05 ENCOUNTER — Other Ambulatory Visit: Payer: Self-pay | Admitting: Family Medicine

## 2013-09-05 VITALS — BP 128/78 | HR 78 | Temp 98.2°F | Resp 16 | Ht 66.0 in | Wt 236.0 lb

## 2013-09-05 DIAGNOSIS — R634 Abnormal weight loss: Secondary | ICD-10-CM | POA: Insufficient documentation

## 2013-09-05 DIAGNOSIS — R7309 Other abnormal glucose: Secondary | ICD-10-CM | POA: Diagnosis not present

## 2013-09-05 DIAGNOSIS — M542 Cervicalgia: Secondary | ICD-10-CM

## 2013-09-05 DIAGNOSIS — G4700 Insomnia, unspecified: Secondary | ICD-10-CM | POA: Insufficient documentation

## 2013-09-05 DIAGNOSIS — M899 Disorder of bone, unspecified: Secondary | ICD-10-CM | POA: Diagnosis not present

## 2013-09-05 DIAGNOSIS — R5381 Other malaise: Secondary | ICD-10-CM | POA: Diagnosis not present

## 2013-09-05 DIAGNOSIS — R5383 Other fatigue: Secondary | ICD-10-CM

## 2013-09-05 DIAGNOSIS — M949 Disorder of cartilage, unspecified: Secondary | ICD-10-CM

## 2013-09-05 DIAGNOSIS — R530 Neoplastic (malignant) related fatigue: Secondary | ICD-10-CM

## 2013-09-05 LAB — CBC WITH DIFFERENTIAL/PLATELET
Basophils Absolute: 0 10*3/uL (ref 0.0–0.1)
Basophils Relative: 0 % (ref 0–1)
Eosinophils Absolute: 0 10*3/uL (ref 0.0–0.7)
Eosinophils Relative: 0 % (ref 0–5)
HCT: 42.2 % (ref 39.0–52.0)
Hemoglobin: 14.7 g/dL (ref 13.0–17.0)
Lymphocytes Relative: 19 % (ref 12–46)
Lymphs Abs: 2.3 10*3/uL (ref 0.7–4.0)
MCH: 28.3 pg (ref 26.0–34.0)
MCHC: 34.8 g/dL (ref 30.0–36.0)
MCV: 81.3 fL (ref 78.0–100.0)
Monocytes Absolute: 1.4 10*3/uL — ABNORMAL HIGH (ref 0.1–1.0)
Monocytes Relative: 12 % (ref 3–12)
Neutro Abs: 8.2 10*3/uL — ABNORMAL HIGH (ref 1.7–7.7)
Neutrophils Relative %: 69 % (ref 43–77)
Platelets: 316 10*3/uL (ref 150–400)
RBC: 5.19 MIL/uL (ref 4.22–5.81)
RDW: 13.6 % (ref 11.5–15.5)
WBC: 11.9 10*3/uL — ABNORMAL HIGH (ref 4.0–10.5)

## 2013-09-05 LAB — COMPREHENSIVE METABOLIC PANEL
ALT: 11 U/L (ref 0–53)
AST: 15 U/L (ref 0–37)
Albumin: 3.5 g/dL (ref 3.5–5.2)
Alkaline Phosphatase: 32 U/L — ABNORMAL LOW (ref 39–117)
BUN: 16 mg/dL (ref 6–23)
CO2: 22 mEq/L (ref 19–32)
Calcium: 9 mg/dL (ref 8.4–10.5)
Chloride: 103 mEq/L (ref 96–112)
Creat: 0.74 mg/dL (ref 0.50–1.35)
Glucose, Bld: 104 mg/dL — ABNORMAL HIGH (ref 70–99)
Potassium: 4.9 mEq/L (ref 3.5–5.3)
Sodium: 138 mEq/L (ref 135–145)
Total Bilirubin: 0.5 mg/dL (ref 0.2–1.2)
Total Protein: 6.3 g/dL (ref 6.0–8.3)

## 2013-09-05 LAB — TSH: TSH: 0.786 u[IU]/mL (ref 0.350–4.500)

## 2013-09-05 MED ORDER — MIRTAZAPINE 15 MG PO TABS: 7.5000 mg | ORAL_TABLET | Freq: Every day | ORAL | Status: DC

## 2013-09-05 NOTE — Patient Instructions (Signed)
Start remeron take 1/2 tablet at around 10pm Pick up ensure twice a day  We will call with labs  F/U 6 weeks

## 2013-09-05 NOTE — Assessment & Plan Note (Signed)
Chronic from radiation and now with decreased ROM Okay to use gabapentin BID, more than this caused leg swelling and somnolence

## 2013-09-05 NOTE — Assessment & Plan Note (Signed)
His weight loss actually started about 8 months ago after his radiation treatments and has been consistent. He did have a repeat MRI of his brain which showed decrease in the size of the skull lesion back in April. I'll recheck some labs today. I will start him on Remeron 7.5 mg at bedtime she also help his sleep. He will also drink ensure twice a day. He is followup with his oncologist in 4 weeks. Pending results of labs will discuss if he needs any other imaging He did have a PSA less than a year ago which was normal No cardio respiratory symptoms

## 2013-09-05 NOTE — Assessment & Plan Note (Signed)
Followed by oncology 

## 2013-09-05 NOTE — Assessment & Plan Note (Signed)
Chronic fatigue, reviewed oncology notes as well, labs to be done, I think this is hand in hand with his weight loss and poor sleep

## 2013-09-05 NOTE — Progress Notes (Signed)
Patient ID: Dean [name], male   DOB: 03/20/1935, 78 y.o.   MRN: [id number]   Subjective:    Patient ID: Dean [name], male    DOB: 1935/09/11, 78 y.o.   MRN: [id number]  Patient presents for [facility] and Weakness  patient presents with weakness. He states he's been a week since his radiation treatments for his occipital condyle syndrome skull lesion. His appetite has been very poor since then he has lost a significant amount weight, since January 257.8 pounds he is now down to 236 pounds. He has lost his sense of smell as well as most of his sense of taste. This has been discussed with his radiation oncologist but there is not much that can be done. He typically eats one  good meal a day. He is still trying to be active around his home. He gets a lot of discomfort with his neck where he had the treatment he has lost some range of motion.  He denies any chest pain shortness of breath his bowels are moving well his water is passing. His last set of labs were about 3 or so months ago which were fairly unremarkable. He is taking his medications as prescribed but does not sleep very well. He was prescribed Xanax to use at bedtime but he still wakes up after a couple of hours she also takes gabapentin which is for his neuropathy as well as his neck pain this works well but he still does not sleep very good.  Note his neck is a little more sore today but states that he was out mowing the yard this past weekend when his soreness started  He was also stung by Bees a  couple weeks ago the rash is now resolved Review Of Systems:  GEN- + fatigue, fever, weight loss,weakness, recent illness, no falls HEENT- denies eye drainage, change in vision, nasal discharge, CVS- denies chest pain, palpitations RESP- denies SOB, cough, wheeze ABD- denies N/V, change in stools, abd pain GU- denies dysuria, hematuria, dribbling, incontinence MSK- + joint pain, muscle aches, injury Neuro- denies headache, dizziness,  syncope, seizure activity       Objective:    BP 128/78  Pulse 78  Temp(Src) 98.2 F (36.8 C) (Oral)  Resp 16  Ht 5\' 6"  (1.676 m)  Wt 236 lb (107.049 kg)  BMI 38.11 kg/m2 GEN- NAD, alert and oriented x3, non toxic appearing HEENT- PERRL, EOMI, non injected sclera, pink conjunctiva, MMM, oropharynx clear Neck- Supple, decreased ROM, mild TTP posterior neck,  CVS- RRR, no murmur RESP-CTAB ABD-NABS,soft,NT,ND EXT- No edema Skin- in tact, no open lesions, no rash Pulses- Radial 2+        Assessment & Plan:      Problem List Items Addressed This Visit   None    Visit Diagnoses   Loss of weight    -  Primary    Relevant Orders       CBC with Differential       Comprehensive metabolic panel       TSH       Note: This dictation was prepared with Dragon dictation along with smaller phrase technology. Any transcriptional errors that result from this process are unintentional.

## 2013-09-05 NOTE — Assessment & Plan Note (Signed)
remeron per above

## 2013-09-06 LAB — HEMOGLOBIN A1C
Hgb A1c MFr Bld: 5.6 % (ref <5.7)
Mean Plasma Glucose: 114 mg/dL (ref <117)

## 2013-09-08 ENCOUNTER — Encounter: Payer: Self-pay | Admitting: Family Medicine

## 2013-09-08 ENCOUNTER — Ambulatory Visit (INDEPENDENT_AMBULATORY_CARE_PROVIDER_SITE_OTHER): Payer: Medicare Other | Admitting: Family Medicine

## 2013-09-08 VITALS — BP 124/74 | HR 80 | Temp 97.8°F | Resp 18 | Ht 69.0 in | Wt 240.0 lb

## 2013-09-08 DIAGNOSIS — N410 Acute prostatitis: Secondary | ICD-10-CM

## 2013-09-08 DIAGNOSIS — R300 Dysuria: Secondary | ICD-10-CM | POA: Diagnosis not present

## 2013-09-08 LAB — URINALYSIS, MICROSCOPIC ONLY
Casts: NONE SEEN
Crystals: NONE SEEN

## 2013-09-08 LAB — URINALYSIS, ROUTINE W REFLEX MICROSCOPIC
Bilirubin Urine: NEGATIVE
Glucose, UA: NEGATIVE mg/dL
Ketones, ur: NEGATIVE mg/dL
Leukocytes, UA: NEGATIVE
Nitrite: NEGATIVE
Protein, ur: NEGATIVE mg/dL
Specific Gravity, Urine: 1.015 (ref 1.005–1.030)
Urobilinogen, UA: 1 mg/dL (ref 0.0–1.0)
pH: 6.5 (ref 5.0–8.0)

## 2013-09-08 MED ORDER — CIPROFLOXACIN HCL 500 MG PO TABS: 500.0000 mg | ORAL_TABLET | Freq: Two times a day (BID) | ORAL | Status: DC

## 2013-09-08 MED ORDER — ALPRAZOLAM 1 MG PO TABS: 1.0000 mg | ORAL_TABLET | Freq: Two times a day (BID) | ORAL | Status: DC | PRN

## 2013-09-08 NOTE — Addendum Note (Signed)
Addended by: Shary Decamp B on: 09/08/2013 12:22 PM   Modules accepted: Orders

## 2013-09-08 NOTE — Progress Notes (Signed)
Subjective:    Patient ID: Dean [name], male    DOB: July 05, 1935, 78 y.o.   MRN: [id number]  HPI Patient presents with several days of weak urine stream, and dysuria. He also has fevers and night sweats. Patient denies any back pain. He has no CVA tenderness. He has no fever today on examination. Urinalysis is significant for microscopic hematuria but is otherwise normal. Patient also states that he is unable to sleep on Remeron. He like a refill on his Xanax to be used sparingly for insomnia. Past Medical History  Diagnosis Date  . Neuropathy   . BPH (benign prostatic hyperplasia)   . GERD (gastroesophageal reflux disease)   . H/O ETOH abuse   . Skull lesion     Left occipital condyle  . History of radiation therapy 01/05/13-02/10/13    45 gray to left occipital condyle region   Current Outpatient Prescriptions on File Prior to Visit  Medication Sig Dispense Refill  . Aspirin-Salicylamide-Caffeine (BC HEADACHE POWDER PO) Take 1 packet by mouth 2 (two) times daily as needed.      . Cholecalciferol (VITAMIN D3) 1000 UNITS CHEW Chew 1 tablet by mouth daily.      Marland Kitchen gabapentin (NEURONTIN) 400 MG capsule Take 1 capsule (400 mg total) by mouth 4 (four) times daily. May take up to five times daily depending on pain in feet  180 capsule  5  . mirtazapine (REMERON) 15 MG tablet Take 0.5 tablets (7.5 mg total) by mouth at bedtime.  30 tablet  2  . tamsulosin (FLOMAX) 0.4 MG CAPS capsule Take 1 capsule (0.4 mg total) by mouth every evening.  30 capsule  3   No current facility-administered medications on file prior to visit.   Allergies  Allergen Reactions  . Codeine Other (See Comments)    Severe headache  . Morphine And Related Other (See Comments)    Makes him feel weird   History   Social History  . Marital Status: Married    Spouse Name: N/A    Number of Children: 3  . Years of Education: N/A   Occupational History  .     Social History Main Topics  . Smoking status: Never  Smoker   . Smokeless tobacco: Never Used  . Alcohol Use: No     Comment: " Not much no more"  . Drug Use: No  . Sexual Activity: Not on file   Other Topics Concern  . Not on file   Social History Narrative  . No narrative on file      Review of Systems  All other systems reviewed and are negative.      Objective:   Physical Exam  Vitals reviewed. Constitutional: He appears well-developed and well-nourished.  Cardiovascular: Normal rate, regular rhythm and normal heart sounds.   Pulmonary/Chest: Effort normal and breath sounds normal.  Abdominal: Soft. Bowel sounds are normal. He exhibits no distension. There is no tenderness. There is no rebound.  Genitourinary: Rectum normal. Prostate is enlarged and tender.          Assessment & Plan:  1. Burning with urination  - Urinalysis, Routine w reflex microscopic  2. Acute prostatitis I suspect acute prostatitis. I will send a urine culture. Meanwhile begin the patient on Cipro 500 mg by mouth twice a day for 2 weeks. If symptoms persist, treat the patient for an additional 2 weeks. He is to notify me immediately if symptoms worsen. Also refilled patient's Xanax to be  used sparingly for insomnia. - ciprofloxacin (CIPRO) 500 MG tablet; Take 1 tablet (500 mg total) by mouth 2 (two) times daily.  Dispense: 28 tablet; Refill: 0

## 2013-09-09 LAB — URINE CULTURE
Colony Count: NO GROWTH
Organism ID, Bacteria: NO GROWTH

## 2013-10-04 ENCOUNTER — Encounter (HOSPITAL_COMMUNITY): Payer: Medicare Other | Attending: Hematology and Oncology

## 2013-10-04 DIAGNOSIS — M899 Disorder of bone, unspecified: Secondary | ICD-10-CM | POA: Insufficient documentation

## 2013-10-04 DIAGNOSIS — N400 Enlarged prostate without lower urinary tract symptoms: Secondary | ICD-10-CM | POA: Diagnosis not present

## 2013-10-04 DIAGNOSIS — R634 Abnormal weight loss: Secondary | ICD-10-CM | POA: Diagnosis not present

## 2013-10-04 DIAGNOSIS — Z923 Personal history of irradiation: Secondary | ICD-10-CM | POA: Diagnosis not present

## 2013-10-04 DIAGNOSIS — M542 Cervicalgia: Secondary | ICD-10-CM | POA: Diagnosis not present

## 2013-10-04 DIAGNOSIS — D472 Monoclonal gammopathy: Secondary | ICD-10-CM | POA: Insufficient documentation

## 2013-10-04 DIAGNOSIS — R630 Anorexia: Secondary | ICD-10-CM | POA: Insufficient documentation

## 2013-10-04 DIAGNOSIS — K219 Gastro-esophageal reflux disease without esophagitis: Secondary | ICD-10-CM | POA: Insufficient documentation

## 2013-10-04 DIAGNOSIS — M949 Disorder of cartilage, unspecified: Principal | ICD-10-CM

## 2013-10-04 LAB — COMPREHENSIVE METABOLIC PANEL
ALT: 9 U/L (ref 0–53)
AST: 14 U/L (ref 0–37)
Albumin: 3.3 g/dL — ABNORMAL LOW (ref 3.5–5.2)
Alkaline Phosphatase: 32 U/L — ABNORMAL LOW (ref 39–117)
Anion gap: 10 (ref 5–15)
BUN: 18 mg/dL (ref 6–23)
CO2: 27 mEq/L (ref 19–32)
Calcium: 9.1 mg/dL (ref 8.4–10.5)
Chloride: 105 mEq/L (ref 96–112)
Creatinine, Ser: 0.75 mg/dL (ref 0.50–1.35)
GFR calc Af Amer: >90 mL/min (ref >90)
GFR calc non Af Amer: 87 mL/min — ABNORMAL LOW (ref >90)
Glucose, Bld: 92 mg/dL (ref 70–99)
Potassium: 4.4 mEq/L (ref 3.7–5.3)
Sodium: 142 mEq/L (ref 137–147)
Total Bilirubin: 0.5 mg/dL (ref 0.3–1.2)
Total Protein: 6.7 g/dL (ref 6.0–8.3)

## 2013-10-04 LAB — CBC WITH DIFFERENTIAL/PLATELET
Basophils Absolute: 0 10*3/uL (ref 0.0–0.1)
Basophils Relative: 0 % (ref 0–1)
Eosinophils Absolute: 0.1 10*3/uL (ref 0.0–0.7)
Eosinophils Relative: 1 % (ref 0–5)
HCT: 42.4 % (ref 39.0–52.0)
Hemoglobin: 13.9 g/dL (ref 13.0–17.0)
Lymphocytes Relative: 37 % (ref 12–46)
Lymphs Abs: 2.4 10*3/uL (ref 0.7–4.0)
MCH: 28.3 pg (ref 26.0–34.0)
MCHC: 32.8 g/dL (ref 30.0–36.0)
MCV: 86.2 fL (ref 78.0–100.0)
Monocytes Absolute: 0.6 10*3/uL (ref 0.1–1.0)
Monocytes Relative: 10 % (ref 3–12)
Neutro Abs: 3.3 10*3/uL (ref 1.7–7.7)
Neutrophils Relative %: 52 % (ref 43–77)
Platelets: 194 10*3/uL (ref 150–400)
RBC: 4.92 MIL/uL (ref 4.22–5.81)
RDW: 14.2 % (ref 11.5–15.5)
WBC: 6.4 10*3/uL (ref 4.0–10.5)

## 2013-10-04 NOTE — Progress Notes (Signed)
LA BS FOR CBCD,CMP,MM,KLLC

## 2013-10-05 LAB — KAPPA/LAMBDA LIGHT CHAINS
Kappa free light chain: 4.83 mg/dL — ABNORMAL HIGH (ref 0.33–1.94)
Kappa, lambda light chain ratio: 2.79 — ABNORMAL HIGH (ref 0.26–1.65)
Lambda free light chains: 1.73 mg/dL (ref 0.57–2.63)

## 2013-10-06 ENCOUNTER — Ambulatory Visit (HOSPITAL_COMMUNITY): Payer: Medicare Other

## 2013-10-06 LAB — MULTIPLE MYELOMA PANEL, SERUM
Albumin ELP: 50.4 % — ABNORMAL LOW (ref 55.8–66.1)
Alpha-1-Globulin: 11.6 % — ABNORMAL HIGH (ref 2.9–4.9)
Alpha-2-Globulin: 12.8 % — ABNORMAL HIGH (ref 7.1–11.8)
Beta 2: 3.7 % (ref 3.2–6.5)
Beta Globulin: 6.9 % (ref 4.7–7.2)
Gamma Globulin: 14.6 % (ref 11.1–18.8)
IgA: 174 mg/dL (ref 68–379)
IgG (Immunoglobin G), Serum: 985 mg/dL (ref 650–1600)
IgM, Serum: 28 mg/dL — ABNORMAL LOW (ref 41–251)
M-Spike, %: 0.5 g/dL
Total Protein: 6.2 g/dL (ref 6.0–8.3)

## 2013-10-11 ENCOUNTER — Encounter (HOSPITAL_BASED_OUTPATIENT_CLINIC_OR_DEPARTMENT_OTHER): Payer: Medicare Other

## 2013-10-11 ENCOUNTER — Encounter (HOSPITAL_COMMUNITY): Payer: Self-pay | Admitting: Hematology and Oncology

## 2013-10-11 ENCOUNTER — Encounter (HOSPITAL_COMMUNITY): Payer: Self-pay

## 2013-10-11 ENCOUNTER — Encounter (HOSPITAL_COMMUNITY): Payer: Medicare Other

## 2013-10-11 VITALS — BP 131/72 | HR 70 | Temp 98.3°F | Resp 18 | Wt 239.0 lb

## 2013-10-11 DIAGNOSIS — M899 Disorder of bone, unspecified: Secondary | ICD-10-CM

## 2013-10-11 DIAGNOSIS — D472 Monoclonal gammopathy: Secondary | ICD-10-CM | POA: Diagnosis not present

## 2013-10-11 DIAGNOSIS — K219 Gastro-esophageal reflux disease without esophagitis: Secondary | ICD-10-CM | POA: Diagnosis not present

## 2013-10-11 DIAGNOSIS — M949 Disorder of cartilage, unspecified: Secondary | ICD-10-CM

## 2013-10-11 DIAGNOSIS — R634 Abnormal weight loss: Secondary | ICD-10-CM

## 2013-10-11 DIAGNOSIS — G543 Thoracic root disorders, not elsewhere classified: Secondary | ICD-10-CM

## 2013-10-11 DIAGNOSIS — H9192 Unspecified hearing loss, left ear: Secondary | ICD-10-CM

## 2013-10-11 NOTE — Patient Instructions (Signed)
[facility] Discharge Instructions  RECOMMENDATIONS MADE BY THE CONSULTANT AND ANY TEST RESULTS WILL BE SENT TO YOUR REFERRING PHYSICIAN.  MRI of thoracic spine; we will call you with the appointment date and time Return in one month for office visit and lab work.  Thank you for choosing [facility] to provide your oncology and hematology care.  To afford each patient quality time with our providers, please arrive at least 15 minutes before your scheduled appointment time.  With your help, our goal is to use those 15 minutes to complete the necessary work-up to ensure our physicians have the information they need to help with your evaluation and healthcare recommendations.    Effective January 1st, 2014, we ask that you re-schedule your appointment with our physicians should you arrive 10 or more minutes late for your appointment.  We strive to give you quality time with our providers, and arriving late affects you and other patients whose appointments are after yours.    Again, thank you for choosing [facility].  Our hope is that these requests will decrease the amount of time that you wait before being seen by our physicians.       _____________________________________________________________  Should you have questions after your visit to [facility], please contact our office at [phone] between the hours of 8:30 a.m. and 4:30 p.m.  Voicemails left after 4:30 p.m. will not be returned until the following business day.  For prescription refill requests, have your pharmacy contact our office with your prescription refill request.    _______________________________________________________________  We hope that we have given you very good care.  You may receive a patient satisfaction survey in the mail, please complete it and return it as soon as possible.  We value your  feedback!  _______________________________________________________________  Have you asked about our STAR program?  STAR stands for Survivorship Training and Rehabilitation, and this is a nationally recognized cancer care program that focuses on survivorship and rehabilitation.  Cancer and cancer treatments may cause problems, such as, pain, making you feel tired and keeping you from doing the things that you need or want to do. Cancer rehabilitation can help. Our goal is to reduce these troubling effects and help you have the best quality of life possible.  You may receive a survey from a nurse that asks questions about your current state of health.  Based on the survey results, all eligible patients will be referred to the [facility] program for an evaluation so we can better serve you!  A frequently asked questions sheet is available upon request.

## 2013-10-11 NOTE — Progress Notes (Signed)
Dean [name]  OFFICE PROGRESS NOTE  Dean [name] [name], MD [address] [place] [address]  DIAGNOSIS: Skull lesion - Plan: CBC with Differential, Comprehensive metabolic panel, Multiple myeloma panel, serum, Kappa/lambda light chains, MR Thoracic Spine W Wo Contrast  MGUS (monoclonal gammopathy of unknown significance), IgG kappa - Plan: CBC with Differential, Comprehensive metabolic panel, Multiple myeloma panel, serum, Kappa/lambda light chains, CANCELED: MR Thoracic Spine Wo Contrast, CANCELED: MR Thoracic Spine W Contrast  Gastroesophageal reflux disease without esophagitis  Hearing deficit, left  Chief Complaint  Patient presents with  . Left occipital epicondyle syndrome    CURRENT THERAPY: Definitive radiotherapy 01/05/2013 through 02/10/2013 the left occipital condyle region, 45 gray in 25 fractions, no tissue diagnosis for occipital epicondyle syndrome.  INTERVAL HISTORY: Dean [name] 78 y.o. male returns for left occipital condyle syndrome, status post  definitive radiotherapy 01/05/2013 through 02/10/2013 to the left occipital condyle region, 45 gray in 25 fractions, no tissue diagnosis. He does manifest an IgG kappa monoclonal protein with normal total IgG. 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 He denies any numbness but does have occasional slurred speech and drooling. He denies any peripheral paresthesias, and has lost a total of 60 pounds over the past 10 months. He denies any diarrhea, melena, hematochezia, hematuria, urinary hesitancy, incontinence, skin rash, fever, or night sweats.    MEDICAL HISTORY: Past Medical History  Diagnosis Date  . Neuropathy   . BPH (benign prostatic hyperplasia)   . GERD (gastroesophageal reflux disease)   . H/O ETOH abuse   . Skull lesion     Left occipital condyle  . History of radiation therapy 01/05/13-02/10/13    45  gray to left occipital condyle region    INTERIM HISTORY: has Neuropathy; BPH (benign prostatic hyperplasia); GERD (gastroesophageal reflux disease); H/O ETOH abuse; Skull lesion; Neck pain; Fatigue; Loss of weight; Insomnia; and MGUS (monoclonal gammopathy of unknown significance) on his problem list.    ALLERGIES:  is allergic to codeine and morphine and related.  MEDICATIONS: has a current medication list which includes the following prescription(s): alprazolam, aspirin-salicylamide-caffeine, vitamin d3, gabapentin, tamsulosin, and mirtazapine.  SURGICAL HISTORY:  Past Surgical History  Procedure Laterality Date  . Cyst excision  1959    tail bone    FAMILY HISTORY: family history includes Diabetes in his mother; Hypertension in his father; Stroke in his mother.  SOCIAL HISTORY:  reports that he has never smoked. He has never used smokeless tobacco. He reports that he does not drink alcohol or use illicit drugs.  REVIEW OF SYSTEMS:  Other than that discussed above is noncontributory.  PHYSICAL EXAMINATION: ECOG PERFORMANCE STATUS: 1 - Symptomatic but completely ambulatory  Blood pressure 131/72, pulse 70, temperature 98.3 F (36.8 C), temperature source Oral, resp. rate 18, weight 239 lb (108.41 kg), SpO2 97.00%.  GENERAL:alert, no distress and comfortable SKIN: skin color, texture, turgor are normal, no rashes or significant lesions EYES: PERLA; Conjunctiva are pink and non-injected, sclera clear SINUSES: No redness or tenderness over maxillary or ethmoid sinuses OROPHARYNX:no exudate, no erythema on lips, buccal mucosa, or tongue. NECK: supple, thyroid normal size, non-tender, without nodularity. No masses CHEST: Increased AP diameter with no breast masses. LYMPH:  no palpable lymphadenopathy in the cervical, axillary or inguinal LUNGS: clear to auscultation and percussion with normal breathing effort HEART: regular rate & rhythm and no murmurs. ABDOMEN:abdomen soft,  non-tender and normal bowel sounds MUSCULOSKELETAL:no cyanosis of digits and no clubbing. Range of motion normal.  NEURO: alert & oriented x 3 with fluent speech, no focal motor/sensory deficits. Decrease hearing acuity in the left ear.   LABORATORY DATA: Lab on 10/04/2013  Component Date Value Ref Range Status  . WBC 10/04/2013 6.4  4.0 - 10.5 K/uL Final  . RBC 10/04/2013 4.92  4.22 - 5.81 MIL/uL Final  . Hemoglobin 10/04/2013 13.9  13.0 - 17.0 g/dL Final  . HCT 10/04/2013 42.4  39.0 - 52.0 % Final  . MCV 10/04/2013 86.2  78.0 - 100.0 fL Final  . MCH 10/04/2013 28.3  26.0 - 34.0 pg Final  . MCHC 10/04/2013 32.8  30.0 - 36.0 g/dL Final  . RDW 10/04/2013 14.2  11.5 - 15.5 % Final  . Platelets 10/04/2013 194  150 - 400 K/uL Final  . Neutrophils Relative % 10/04/2013 52  43 - 77 % Final  . Neutro Abs 10/04/2013 3.3  1.7 - 7.7 K/uL Final  . Lymphocytes Relative 10/04/2013 37  12 - 46 % Final  . Lymphs Abs 10/04/2013 2.4  0.7 - 4.0 K/uL Final  . Monocytes Relative 10/04/2013 10  3 - 12 % Final  . Monocytes Absolute 10/04/2013 0.6  0.1 - 1.0 K/uL Final  . Eosinophils Relative 10/04/2013 1  0 - 5 % Final  . Eosinophils Absolute 10/04/2013 0.1  0.0 - 0.7 K/uL Final  . Basophils Relative 10/04/2013 0  0 - 1 % Final  . Basophils Absolute 10/04/2013 0.0  0.0 - 0.1 K/uL Final  . Sodium 10/04/2013 142  137 - 147 mEq/L Final  . Potassium 10/04/2013 4.4  3.7 - 5.3 mEq/L Final  . Chloride 10/04/2013 105  96 - 112 mEq/L Final  . CO2 10/04/2013 27  19 - 32 mEq/L Final  . Glucose, Bld 10/04/2013 92  70 - 99 mg/dL Final  . BUN 10/04/2013 18  6 - 23 mg/dL Final  . Creatinine, Ser 10/04/2013 0.75  0.50 - 1.35 mg/dL Final  . Calcium 10/04/2013 9.1  8.4 - 10.5 mg/dL Final  . Total Protein 10/04/2013 6.7  6.0 - 8.3 g/dL Final  . Albumin 10/04/2013 3.3* 3.5 - 5.2 g/dL Final  . AST 10/04/2013 14  0 - 37 U/L Final  . ALT 10/04/2013 9  0 - 53 U/L Final  . Alkaline Phosphatase 10/04/2013 32* 39 - 117 U/L  Final  . Total Bilirubin 10/04/2013 0.5  0.3 - 1.2 mg/dL Final  . GFR calc non Af Amer 10/04/2013 87* >90 mL/min Final  . GFR calc Af Amer 10/04/2013 >90  >90 mL/min Final   Comment: (NOTE)                          The eGFR has been calculated using the CKD EPI equation.                          This calculation has not been validated in all clinical situations.                          eGFR's persistently <90 mL/min signify possible Chronic Kidney                          Disease.  . Anion gap 10/04/2013 10  5 - 15 Final  . Total  Protein 10/04/2013 6.2  6.0 - 8.3 g/dL Final  . Albumin ELP 10/04/2013 50.4* 55.8 - 66.1 % Final  . Alpha-1-Globulin 10/04/2013 11.6* 2.9 - 4.9 % Final  . Alpha-2-Globulin 10/04/2013 12.8* 7.1 - 11.8 % Final  . Beta Globulin 10/04/2013 6.9  4.7 - 7.2 % Final  . Beta 2 10/04/2013 3.7  3.2 - 6.5 % Final  . Gamma Globulin 10/04/2013 14.6  11.1 - 18.8 % Final  . M-Spike, % 10/04/2013 0.50   Final  . SPE Interp. 10/04/2013 (NOTE)   Final   Comment: A restricted band consistent with monoclonal protein is present.                          The monoclonal protein peak accounts for 0.50 g/dL of the total                          0.91 g/dL of protein in the gamma region.                          Results are consistent with SPE performed on 05/10/2013  Reviewed by                          Odis Hollingshead, MD, PhD, FCAP (Electronic Signature on File)  . Comment 10/04/2013 (NOTE)   Final   Comment: ---------------                          Serum protein electrophoresis is a useful screening procedure in the                          detection of various pathophysiologic states such as inflammation,                          gammopathies, protein loss and other dysproteinemias.  Immunofixation                          electrophoresis (IFE) is a more sensitive technique for the                          identification of M-proteins found in patients with monoclonal                           gammopathy of unknown significance (MGUS), amyloidosis, early or                          treated myeloma or macroglobulinemia, solitary plasmacytoma or                          extramedullary plasmacytoma.  . IgG (Immunoglobin G), Serum 10/04/2013 985  650 - 1600 mg/dL Final  . IgA 10/04/2013 174  68 - 379 mg/dL Final  . IgM, Serum 10/04/2013 28* 41 - 251 mg/dL Final  . Immunofix Electr Int 10/04/2013 (NOTE)   Final   Comment: Monoclonal IgG kappa protein is present.                          Reviewed  by Odis Hollingshead, MD, PhD, FCAP (Electronic Signature on                          File)                          Performed at [facility]  . Kappa free light chain 10/04/2013 4.83* 0.33 - 1.94 mg/dL Final  . Lamda free light chains 10/04/2013 1.73  0.57 - 2.63 mg/dL Final  . Kappa, lamda light chain ratio 10/04/2013 2.79* 0.26 - 1.65 Final   Performed at [place]: None.  Urinalysis    Component Value Date/Time   COLORURINE YELLOW 09/08/2013 1144   APPEARANCEUR CLEAR 09/08/2013 1144   LABSPEC 1.015 09/08/2013 1144   PHURINE 6.5 09/08/2013 1144   GLUCOSEU NEG 09/08/2013 1144   HGBUR MOD* 09/08/2013 1144   BILIRUBINUR NEG 09/08/2013 1144   KETONESUR NEG 09/08/2013 1144   PROTEINUR NEG 09/08/2013 1144   UROBILINOGEN 1 09/08/2013 1144   NITRITE NEG 09/08/2013 1144   LEUKOCYTESUR NEG 09/08/2013 1144    RADIOGRAPHIC STUDIES: No results found.  ASSESSMENT:  #1. 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 Kappa/lambda ratio now increased to 2.79 with IgG kappa monoclonal protein with normal total IgG. #2. Weight loss with anorexia. #3. Gastroesophageal reflux disease, controlled. #4. Benign prostatic hypertrophy, asymptomatic.   PLAN:  #1. Thoracic spine MRI with and without contrast to compare to November study. #2. Followup in one month with CBC, chem  profile, myeloma panel with light chains. #3. 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    All questions were answered. The patient knows to call the clinic with any problems, questions or concerns. We can certainly see the patient much sooner if necessary.   I spent 25 minutes counseling the patient face to face. The total time spent in the appointment was 30 minutes.    Doroteo Bradford, MD 10/11/2013 1:49 PM  DISCLAIMER:  This note was dictated with voice recognition software.  Similar sounding words can inadvertently be transcribed inaccurately and may not be corrected upon review.

## 2013-10-11 NOTE — Addendum Note (Signed)
Addended by: Joie Bimler on: 10/11/2013 03:09 PM   Modules accepted: Orders

## 2013-10-17 ENCOUNTER — Ambulatory Visit (INDEPENDENT_AMBULATORY_CARE_PROVIDER_SITE_OTHER): Payer: Medicare Other | Admitting: Family Medicine

## 2013-10-17 ENCOUNTER — Encounter: Payer: Self-pay | Admitting: Family Medicine

## 2013-10-17 VITALS — BP 128/74 | HR 72 | Temp 97.4°F | Resp 14 | Ht 66.0 in | Wt 239.0 lb

## 2013-10-17 DIAGNOSIS — M949 Disorder of cartilage, unspecified: Secondary | ICD-10-CM | POA: Diagnosis not present

## 2013-10-17 DIAGNOSIS — M899 Disorder of bone, unspecified: Secondary | ICD-10-CM | POA: Diagnosis not present

## 2013-10-17 DIAGNOSIS — R634 Abnormal weight loss: Secondary | ICD-10-CM

## 2013-10-17 DIAGNOSIS — G4700 Insomnia, unspecified: Secondary | ICD-10-CM | POA: Diagnosis not present

## 2013-10-17 DIAGNOSIS — M542 Cervicalgia: Secondary | ICD-10-CM | POA: Diagnosis not present

## 2013-10-17 MED ORDER — MIRTAZAPINE 30 MG PO TABS: 30.0000 mg | ORAL_TABLET | Freq: Every day | ORAL | Status: DC

## 2013-10-17 NOTE — Assessment & Plan Note (Signed)
I think he may have some decreased range of motion secondary to the radiation he received to the skull base he is also given a pinching sensation which is likely some neuropathy possibly from a disc. I will go ahead and obtain a C-spine MRI along with his T-spine MRI make sure he has no lesions along the cervical region

## 2013-10-17 NOTE — Assessment & Plan Note (Signed)
Increase remeron to 30mg  at bedtime,

## 2013-10-17 NOTE — Assessment & Plan Note (Signed)
He has gained 3 pounds since her last visit 6 weeks ago which is an improvement. His appetite is still very poor

## 2013-10-17 NOTE — Progress Notes (Signed)
Patient ID: Borden Thune, male   DOB: 09-26-1935, 78 y.o.   MRN: [id number]   Subjective:    Patient ID: Duff Pozzi, male    DOB: 05-Oct-1935, 78 y.o.   MRN: [id number]  Patient presents for 6 week F/U and MRI  patient here to followup interim visit. He was seen 6 weeks ago secondary to decreased appetite insomnia he was started on Remeron restart it was 7.5 mg one to one tablet did not notice a lot of difference regarding his sleep of note he also would take a half a tablet of Xanax at bedtime. He has gained 3 pounds since her last visit continues to drink ensure with his meals.  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 by his oncologist he requests imaging of his neck as the pain has not improved of note he did have an MRI of his goal about 5 months ago in his skull base lesion on the left side had decreased in size    Review Of Systems:  GEN- denies fatigue, fever, weight loss,weakness, recent illness HEENT- denies eye drainage, change in vision, nasal discharge, CVS- denies chest pain, palpitations RESP- denies SOB, cough, wheeze ABD- denies N/V, change in stools, abd pain GU- denies dysuria, hematuria, dribbling, incontinence MSK- + joint pain, muscle aches, injury Neuro- denies headache, dizziness, syncope, seizure activity       Objective:    BP 128/74  Pulse 72  Temp(Src) 97.4 F (36.3 C) (Oral)  Resp 14  Ht 5\' 6"  (1.676 m)  Wt 239 lb (108.41 kg)  BMI 38.59 kg/m2 GEN- NAD, alert and oriented x3 HEENT- PERRL, EOMI, non injected sclera, pink conjunctiva, MMM, oropharynx clear Neck- Supple, decreased ROM, TTP right and left lateral/post neck CVS- RRR, no murmur RESP-CTAB Neuro- CNII-XII intact, no  focal deficits, normal tone UE EXT- No edema Pulses- Radial 2+        Assessment & Plan:      Problem List Items Addressed This Visit   Skull lesion   Neck pain - Primary   Loss of weight   Insomnia      Note: This dictation was prepared with Dragon dictation along with smaller phrase technology. Any transcriptional errors that result from this process are unintentional.

## 2013-10-17 NOTE — Patient Instructions (Signed)
We will call about MRI of neck C spine Remeron increased to 30mg  at bedtime     ( you can take 2 of the 15mg ) tablets  We will call in a few weeks to recheck medication  F/U 3 months

## 2013-10-18 ENCOUNTER — Other Ambulatory Visit (HOSPITAL_COMMUNITY): Payer: Self-pay | Admitting: Hematology and Oncology

## 2013-10-18 DIAGNOSIS — G543 Thoracic root disorders, not elsewhere classified: Secondary | ICD-10-CM

## 2013-10-19 ENCOUNTER — Other Ambulatory Visit (HOSPITAL_COMMUNITY): Payer: Medicare Other

## 2013-10-20 ENCOUNTER — Other Ambulatory Visit: Payer: Medicare Other

## 2013-10-27 ENCOUNTER — Ambulatory Visit
Admission: RE | Admit: 2013-10-27 | Discharge: 2013-10-27 | Disposition: A | Discharge status: Home / Self Care | Payer: Medicare Other | Source: Ambulatory Visit | Attending: Family Medicine | Admitting: Family Medicine

## 2013-10-27 ENCOUNTER — Ambulatory Visit
Admission: RE | Admit: 2013-10-27 | Discharge: 2013-10-27 | Disposition: A | Discharge status: Home / Self Care | Payer: Medicare Other | Source: Ambulatory Visit | Attending: Hematology and Oncology | Admitting: Hematology and Oncology

## 2013-10-27 DIAGNOSIS — M549 Dorsalgia, unspecified: Secondary | ICD-10-CM | POA: Diagnosis not present

## 2013-10-27 DIAGNOSIS — G543 Thoracic root disorders, not elsewhere classified: Secondary | ICD-10-CM

## 2013-10-27 DIAGNOSIS — M899 Disorder of bone, unspecified: Secondary | ICD-10-CM

## 2013-10-27 DIAGNOSIS — C9000 Multiple myeloma not having achieved remission: Secondary | ICD-10-CM | POA: Diagnosis not present

## 2013-10-27 DIAGNOSIS — M542 Cervicalgia: Secondary | ICD-10-CM | POA: Diagnosis not present

## 2013-10-27 MED ORDER — GADOBENATE DIMEGLUMINE 529 MG/ML IV SOLN
20.0000 mL | Freq: Once | INTRAVENOUS | Status: AC | PRN
  Administered 2013-10-27: 20 mL via INTRAVENOUS

## 2013-10-31 ENCOUNTER — Encounter: Payer: Self-pay | Admitting: Family Medicine

## 2013-10-31 ENCOUNTER — Ambulatory Visit (INDEPENDENT_AMBULATORY_CARE_PROVIDER_SITE_OTHER): Payer: Medicare Other | Admitting: Family Medicine

## 2013-10-31 VITALS — BP 126/72 | HR 94 | Temp 97.6°F | Resp 16 | Ht 69.0 in | Wt 239.0 lb

## 2013-10-31 DIAGNOSIS — N410 Acute prostatitis: Secondary | ICD-10-CM | POA: Diagnosis not present

## 2013-10-31 DIAGNOSIS — R300 Dysuria: Secondary | ICD-10-CM | POA: Diagnosis not present

## 2013-10-31 LAB — URINALYSIS, ROUTINE W REFLEX MICROSCOPIC
Bilirubin Urine: NEGATIVE
Glucose, UA: NEGATIVE mg/dL
Ketones, ur: NEGATIVE mg/dL
Leukocytes, UA: NEGATIVE
Nitrite: NEGATIVE
Protein, ur: NEGATIVE mg/dL
Specific Gravity, Urine: 1.02 (ref 1.005–1.030)
Urobilinogen, UA: NEGATIVE mg/dL (ref 0.0–1.0)
pH: 5.5 (ref 5.0–8.0)

## 2013-10-31 LAB — URINALYSIS, MICROSCOPIC ONLY
Casts: NONE SEEN
Crystals: NONE SEEN

## 2013-10-31 MED ORDER — CIPROFLOXACIN HCL 500 MG PO TABS: 500.0000 mg | ORAL_TABLET | Freq: Two times a day (BID) | ORAL | Status: DC

## 2013-10-31 NOTE — Progress Notes (Signed)
Subjective:    Patient ID: Dean [name], male    DOB: 10/01/1935, 78 y.o.   MRN: [id number]  HPI  Consultations first week of August and diagnosed with a prostate infection. History with 2 weeks of Cipro. His symptoms went away. However over the last couple weeks the symptoms have returned. He reports dysuria, hesitancy, urgency, frequency. He denies any hematuria/gross. He denies any fever or low back pain. He is currently being worked up for possible multiple myeloma. Urinalysis is significant for blood. Past Medical History  Diagnosis Date  . Neuropathy   . BPH (benign prostatic hyperplasia)   . GERD (gastroesophageal reflux disease)   . H/O ETOH abuse   . Skull lesion     Left occipital condyle  . History of radiation therapy 01/05/13-02/10/13    45 gray to left occipital condyle region   Past Surgical History  Procedure Laterality Date  . Cyst excision  1959    tail bone   Current Outpatient Prescriptions on File Prior to Visit  Medication Sig Dispense Refill  . ALPRAZolam (XANAX) 1 MG tablet Take 1 tablet (1 mg total) by mouth 2 (two) times daily as needed. For anxiety and/or sleep  60 tablet  0  . Aspirin-Salicylamide-Caffeine (BC HEADACHE POWDER PO) Take 1 packet by mouth 2 (two) times daily as needed.      . Cholecalciferol (VITAMIN D3) 1000 UNITS CHEW Chew 1 tablet by mouth daily.      Marland Kitchen gabapentin (NEURONTIN) 400 MG capsule Take 1 capsule (400 mg total) by mouth 4 (four) times daily. May take up to five times daily depending on pain in feet  180 capsule  5  . tamsulosin (FLOMAX) 0.4 MG CAPS capsule Take 1 capsule (0.4 mg total) by mouth every evening.  30 capsule  3   No current facility-administered medications on file prior to visit.   Allergies  Allergen Reactions  . Codeine Other (See Comments)    Severe headache  . Morphine And Related Other (See Comments)    Makes him feel weird   History   Social History  . Marital Status: Married    Spouse Name: N/A      Number of Children: 3  . Years of Education: N/A   Occupational History  .     Social History Main Topics  . Smoking status: Never Smoker   . Smokeless tobacco: Never Used  . Alcohol Use: No     Comment: " Not much no more"  . Drug Use: No  . Sexual Activity: Not on file   Other Topics Concern  . Not on file   Social History Narrative  . No narrative on file     Review of Systems  All other systems reviewed and are negative.      Objective:   Physical Exam  Vitals reviewed. Constitutional: He appears well-developed and well-nourished.  Cardiovascular: Normal rate and regular rhythm.   No murmur heard. Pulmonary/Chest: Effort normal and breath sounds normal.  Abdominal: Soft. Bowel sounds are normal. He exhibits no distension. There is no tenderness. There is no rebound and no guarding.          Assessment & Plan:  Dysuria - Plan: Urinalysis, Routine w reflex microscopic, Urine culture  Acute prostatitis - Plan: ciprofloxacin (CIPRO) 500 MG tablet   Symptoms sound consistent with prostatitis that I did not adequately treat in August. I like to extend Cipro to 500 mg by mouth twice a day for  7 weeks. I explained to the patient that if his symptoms do not resolve 100%, I would like him to see urology for possible cystoscopy given the blood that has persisted in his urine.  He is scheduled to see his oncologist October 6 to discuss the results of his MRI of his thoracic spine.

## 2013-11-01 LAB — URINE CULTURE: Colony Count: 30000

## 2013-11-04 ENCOUNTER — Other Ambulatory Visit (HOSPITAL_COMMUNITY): Payer: Self-pay

## 2013-11-04 DIAGNOSIS — M899 Disorder of bone, unspecified: Secondary | ICD-10-CM

## 2013-11-08 ENCOUNTER — Other Ambulatory Visit (HOSPITAL_COMMUNITY): Payer: Medicare Other

## 2013-11-08 ENCOUNTER — Encounter (HOSPITAL_BASED_OUTPATIENT_CLINIC_OR_DEPARTMENT_OTHER): Payer: Medicare Other

## 2013-11-08 ENCOUNTER — Encounter (HOSPITAL_COMMUNITY): Payer: Self-pay

## 2013-11-08 ENCOUNTER — Encounter (HOSPITAL_COMMUNITY): Payer: Medicare Other | Attending: Hematology and Oncology

## 2013-11-08 VITALS — BP 132/78 | HR 83 | Temp 97.8°F | Resp 16 | Wt 239.3 lb

## 2013-11-08 DIAGNOSIS — D472 Monoclonal gammopathy: Secondary | ICD-10-CM

## 2013-11-08 DIAGNOSIS — Z923 Personal history of irradiation: Secondary | ICD-10-CM | POA: Insufficient documentation

## 2013-11-08 DIAGNOSIS — N400 Enlarged prostate without lower urinary tract symptoms: Secondary | ICD-10-CM | POA: Diagnosis not present

## 2013-11-08 DIAGNOSIS — M899 Disorder of bone, unspecified: Secondary | ICD-10-CM | POA: Insufficient documentation

## 2013-11-08 DIAGNOSIS — K219 Gastro-esophageal reflux disease without esophagitis: Secondary | ICD-10-CM | POA: Diagnosis not present

## 2013-11-08 DIAGNOSIS — R630 Anorexia: Secondary | ICD-10-CM | POA: Insufficient documentation

## 2013-11-08 DIAGNOSIS — R634 Abnormal weight loss: Secondary | ICD-10-CM | POA: Diagnosis not present

## 2013-11-08 DIAGNOSIS — M542 Cervicalgia: Secondary | ICD-10-CM | POA: Diagnosis not present

## 2013-11-08 LAB — CBC WITH DIFFERENTIAL/PLATELET
Basophils Absolute: 0 10*3/uL (ref 0.0–0.1)
Basophils Relative: 0 % (ref 0–1)
Eosinophils Absolute: 0.1 10*3/uL (ref 0.0–0.7)
Eosinophils Relative: 2 % (ref 0–5)
HCT: 45.3 % (ref 39.0–52.0)
Hemoglobin: 14.9 g/dL (ref 13.0–17.0)
Lymphocytes Relative: 43 % (ref 12–46)
Lymphs Abs: 3.2 10*3/uL (ref 0.7–4.0)
MCH: 28.2 pg (ref 26.0–34.0)
MCHC: 32.9 g/dL (ref 30.0–36.0)
MCV: 85.8 fL (ref 78.0–100.0)
Monocytes Absolute: 0.6 10*3/uL (ref 0.1–1.0)
Monocytes Relative: 9 % (ref 3–12)
Neutro Abs: 3.5 10*3/uL (ref 1.7–7.7)
Neutrophils Relative %: 46 % (ref 43–77)
Platelets: 185 10*3/uL (ref 150–400)
RBC: 5.28 MIL/uL (ref 4.22–5.81)
RDW: 14.9 % (ref 11.5–15.5)
WBC: 7.5 10*3/uL (ref 4.0–10.5)

## 2013-11-08 LAB — COMPREHENSIVE METABOLIC PANEL
ALT: 8 U/L (ref 0–53)
AST: 13 U/L (ref 0–37)
Albumin: 3.4 g/dL — ABNORMAL LOW (ref 3.5–5.2)
Alkaline Phosphatase: 35 U/L — ABNORMAL LOW (ref 39–117)
Anion gap: 12 (ref 5–15)
BUN: 13 mg/dL (ref 6–23)
CO2: 25 mEq/L (ref 19–32)
Calcium: 9.2 mg/dL (ref 8.4–10.5)
Chloride: 104 mEq/L (ref 96–112)
Creatinine, Ser: 0.84 mg/dL (ref 0.50–1.35)
GFR calc Af Amer: >90 mL/min (ref >90)
GFR calc non Af Amer: 83 mL/min — ABNORMAL LOW (ref >90)
Glucose, Bld: 99 mg/dL (ref 70–99)
Potassium: 4.5 mEq/L (ref 3.7–5.3)
Sodium: 141 mEq/L (ref 137–147)
Total Bilirubin: 0.5 mg/dL (ref 0.3–1.2)
Total Protein: 7 g/dL (ref 6.0–8.3)

## 2013-11-08 NOTE — Progress Notes (Signed)
LABS FOR MM,KLLC,CBCD,CMP

## 2013-11-08 NOTE — Patient Instructions (Signed)
[facility] Discharge Instructions  RECOMMENDATIONS MADE BY THE CONSULTANT AND ANY TEST RESULTS WILL BE SENT TO YOUR REFERRING PHYSICIAN.  EXAM FINDINGS BY THE PHYSICIAN TODAY AND SIGNS OR SYMPTOMS TO REPORT TO CLINIC OR PRIMARY PHYSICIAN: Exam and findings as discussed by Dr. Bubba Hales.  Need to do some special urine tests and will see you back next week for definitive care and recommendations..    INSTRUCTIONS/FOLLOW-UP: Follow-up next week.  Thank you for choosing [facility] to provide your oncology and hematology care.  To afford each patient quality time with our providers, please arrive at least 15 minutes before your scheduled appointment time.  With your help, our goal is to use those 15 minutes to complete the necessary work-up to ensure our physicians have the information they need to help with your evaluation and healthcare recommendations.    Effective January 1st, 2014, we ask that you re-schedule your appointment with our physicians should you arrive 10 or more minutes late for your appointment.  We strive to give you quality time with our providers, and arriving late affects you and other patients whose appointments are after yours.    Again, thank you for choosing [facility].  Our hope is that these requests will decrease the amount of time that you wait before being seen by our physicians.       _____________________________________________________________  Should you have questions after your visit to [facility], please contact our office at [phone] between the hours of 8:30 a.m. and 4:30 p.m.  Voicemails left after 4:30 p.m. will not be returned until the following business day.  For prescription refill requests, have your pharmacy contact our office with your prescription refill request.    _______________________________________________________________  We hope that we have given you very good care.  You  may receive a patient satisfaction survey in the mail, please complete it and return it as soon as possible.  We value your feedback!  _______________________________________________________________  Have you asked about our STAR program?  STAR stands for Survivorship Training and Rehabilitation, and this is a nationally recognized cancer care program that focuses on survivorship and rehabilitation.  Cancer and cancer treatments may cause problems, such as, pain, making you feel tired and keeping you from doing the things that you need or want to do. Cancer rehabilitation can help. Our goal is to reduce these troubling effects and help you have the best quality of life possible.  You may receive a survey from a nurse that asks questions about your current state of health.  Based on the survey results, all eligible patients will be referred to the [facility] program for an evaluation so we can better serve you!  A frequently asked questions sheet is available upon request.  24-Hour Urine Collection HOME CARE  When you get up in the morning on the day you do this test, pee (urinate) in the toilet and flush. Make a note of the time. This will be your start time on the day of collection and the end time on the next morning.  From then on, save all your pee (urine) in the plastic jug that was given to you.  You should stop collecting your pee 24 hours after you started.  If the plastic jug that is given to you already has liquid in it, that is okay. Do not throw out the liquid or rinse out the jug. Some tests need the liquid to be added  to your pee.  Keep your plastic jug cool (in an ice chest or the refrigerator) during the test.  When the 24 hours is over, bring your plastic jug to the clinic lab. Keep the jug cool (in an ice chest) while you are bringing it to the lab. Document Released: 04/18/2008 Document Revised: 04/14/2011 Document Reviewed: 04/18/2008 [facility] Patient Information 2015  [place], [place]. This information is not intended to replace advice given to you by your health care provider. Make sure you discuss any questions you have with your health care provider.

## 2013-11-09 NOTE — Progress Notes (Signed)
[facility] OFFICE PROGRESS NOTE  PCP [name], MD [address] [place] [address]  DIAGNOSIS: Skull lesion - Plan: Protein electrophoresis, urine, Protein, urine, 24 hour, Protein electrophoresis, urine IgG Kappa monoclonal gammopathy  CURRENT THERAPY: Definitive radiotherapy 45 Gy comopleted 02/10/2013   INTERVAL HEMATOLOGY/ONCOLOGY HX: Dean [name] is returning for a 1 month status check. His posterior neck and occipital pain significantly palliated with radiation. He has not experienced any neurologic deficits although neck with decreased range of motion. Repeat MRI scans of the spine completed and to be discussed below.    MEDICAL HISTORY:  Past Medical History  Diagnosis Date  . Neuropathy   . BPH (benign prostatic hyperplasia)   . GERD (gastroesophageal reflux disease)   . H/O ETOH abuse   . Skull lesion     Left occipital condyle  . History of radiation therapy 01/05/13-02/10/13    45 gray to left occipital condyle region    has Neuropathy; BPH (benign prostatic hyperplasia); GERD (gastroesophageal reflux disease); H/O ETOH abuse; Skull lesion; Neck pain; Fatigue; Loss of weight; Insomnia; and MGUS (monoclonal gammopathy of unknown significance) on his problem list.    ALLERGIES:  is allergic to codeine and morphine and related.  MEDICATIONS: has a current medication list which includes the following prescription(s): alprazolam, aspirin-salicylamide-caffeine, ciprofloxacin, gabapentin, tamsulosin, and vitamin d3.  FAMILY HISTORY: family history includes Diabetes in his mother; Hypertension in his father; Stroke in his mother.  REVIEW OF SYSTEMS:    SINCE YOUR LAST VISIT Been diagnosed or treated for a new medical /surgical  problem or condition: No Any Recent Xrays or studies performed: Yes Any new prescription or OTC medications: No ECOG Perf Status: Restricted in physically strenuous activity but ambulatory and  able to carry out work of a light or sedentary nature, e.g., light house work, office work Problems sleeping: No Medications taken to help sleep: Yes How is your appetie: 50% normal Any Supplements:  (ensure 1 - 2 daily) Any trouble chewing or swallowing: No Any Nausea or Vomiting: No Any Bowel problems: No # Bowel Movements per week: 7 Any Urinary Issues: No Any Cardiac Problems: No Any Respiratory Issues: No Any Neurological Issues: Yes (some neck discomfort) Do you live alone: No Feelings hopelessness: No You or your family have any concerns or Health changes: Yes (Wants to know test results) Pain Assessment Pain Score: 8  Pain Location: Neck Pain Orientation: Posterior Pain Descriptors / Indicators: Sore;Aching Pain Frequency: Intermittent Pain Onset: On-going Pain Relieving Factors:  (BC powder) Pain Aggravating Factors:  (turning head) Pain Intervention(s): Medication (See eMAR) Significant Pain in Recent Past: Yes, chronic Effect of Pain on Daily Activities:  (can't do some things because can't turn head to look)  Other than that discussed above is noncontributory.    PHYSICAL EXAMINATION:   weight is 239 lb 4.8 oz (108.546 kg). His oral temperature is 97.8 F (36.6 C). His blood pressure is 132/78 and his pulse is 83. His respiration is 16 and oxygen saturation is 99%.    GENERAL: alert, no distress and comfortable SKIN: skin color, texture, turgor are normal, no rashes or significant lesions. OROPHARYNX: no exudate, no erythema on lips, buccal mucosa, or tongue. NECK:non-tender, without nodularity. No masses LYMPH:  no palpable lymphadenopathy in the cervical, axillary or inguinal LUNGS: clear to auscultation and percussion with normal breathing effort  HEART: regular rate & rhythm and no murmurs.  ABDOMEN: abdomen soft, non-tender and normal bowel  sounds, and no hepatosplenomegaly MUSCULOSKELETAL/EXTREMITIES: Range of motion normal. No chest wall or rib  tenderness. No thoracic, lumbar, or sacral tenderness. NEURO: alert & oriented x 3 with fluent speech, no focal motor/sensory deficits   LABORATORY DATA: Lab on 11/08/2013  Component Date Value Ref Range Status  . WBC 11/08/2013 7.5  4.0 - 10.5 K/uL Final  . RBC 11/08/2013 5.28  4.22 - 5.81 MIL/uL Final  . Hemoglobin 11/08/2013 14.9  13.0 - 17.0 g/dL Final  . HCT 11/08/2013 45.3  39.0 - 52.0 % Final  . MCV 11/08/2013 85.8  78.0 - 100.0 fL Final  . MCH 11/08/2013 28.2  26.0 - 34.0 pg Final  . MCHC 11/08/2013 32.9  30.0 - 36.0 g/dL Final  . RDW 11/08/2013 14.9  11.5 - 15.5 % Final  . Platelets 11/08/2013 185  150 - 400 K/uL Final  . Neutrophils Relative % 11/08/2013 46  43 - 77 % Final  . Neutro Abs 11/08/2013 3.5  1.7 - 7.7 K/uL Final  . Lymphocytes Relative 11/08/2013 43  12 - 46 % Final  . Lymphs Abs 11/08/2013 3.2  0.7 - 4.0 K/uL Final  . Monocytes Relative 11/08/2013 9  3 - 12 % Final  . Monocytes Absolute 11/08/2013 0.6  0.1 - 1.0 K/uL Final  . Eosinophils Relative 11/08/2013 2  0 - 5 % Final  . Eosinophils Absolute 11/08/2013 0.1  0.0 - 0.7 K/uL Final  . Basophils Relative 11/08/2013 0  0 - 1 % Final  . Basophils Absolute 11/08/2013 0.0  0.0 - 0.1 K/uL Final  . Sodium 11/08/2013 141  137 - 147 mEq/L Final  . Potassium 11/08/2013 4.5  3.7 - 5.3 mEq/L Final  . Chloride 11/08/2013 104  96 - 112 mEq/L Final  . CO2 11/08/2013 25  19 - 32 mEq/L Final  . Glucose, Bld 11/08/2013 99  70 - 99 mg/dL Final  . BUN 11/08/2013 13  6 - 23 mg/dL Final  . Creatinine, Ser 11/08/2013 0.84  0.50 - 1.35 mg/dL Final  . Calcium 11/08/2013 9.2  8.4 - 10.5 mg/dL Final  . Total Protein 11/08/2013 7.0  6.0 - 8.3 g/dL Final  . Albumin 11/08/2013 3.4* 3.5 - 5.2 g/dL Final  . AST 11/08/2013 13  0 - 37 U/L Final  . ALT 11/08/2013 8  0 - 53 U/L Final  . Alkaline Phosphatase 11/08/2013 35* 39 - 117 U/L Final  . Total Bilirubin 11/08/2013 0.5  0.3 - 1.2 mg/dL Final  . GFR calc non Af Amer 11/08/2013 83*  >90 mL/min Final  . GFR calc Af Amer 11/08/2013 >90  >90 mL/min Final   Comment: (NOTE)                          The eGFR has been calculated using the CKD EPI equation.                          This calculation has not been validated in all clinical situations.                          eGFR's persistently <90 mL/min signify possible Chronic Kidney                          Disease.  . Anion gap 11/08/2013 12  5 - 15 Final  . Total Protein 11/08/2013  6.6  6.0 - 8.3 g/dL Final  . Albumin ELP 11/08/2013 PENDING  55.8 - 66.1 % Incomplete  . Alpha-1-Globulin 11/08/2013 PENDING  2.9 - 4.9 % Incomplete  . Alpha-2-Globulin 11/08/2013 PENDING  7.1 - 11.8 % Incomplete  . Beta Globulin 11/08/2013 PENDING  4.7 - 7.2 % Incomplete  . Beta 2 11/08/2013 PENDING  3.2 - 6.5 % Incomplete  . Gamma Globulin 11/08/2013 PENDING  11.1 - 18.8 % Incomplete  . M-Spike, % 11/08/2013 PENDING   Incomplete  . SPE Interp. 11/08/2013 PENDING   Incomplete  . Comment 11/08/2013 PENDING   Incomplete  . IgG (Immunoglobin G), Serum 11/08/2013 922  650 - 1600 mg/dL Final  . IgA 11/08/2013 170  68 - 379 mg/dL Final  . IgM, Serum 11/08/2013 32* 41 - 251 mg/dL Final   Performed at [facility]  . Immunofix Electr Int 11/08/2013 PENDING   Incomplete  Office Visit on 10/31/2013  Component Date Value Ref Range Status  . Color, Urine 10/31/2013 YELLOW  YELLOW Final  . APPearance 10/31/2013 CLEAR  CLEAR Final  . Specific Gravity, Urine 10/31/2013 1.020  1.005 - 1.030 Final  . pH 10/31/2013 5.5  5.0 - 8.0 Final  . Glucose, UA 10/31/2013 NEG  NEG mg/dL Final  . Bilirubin Urine 10/31/2013 NEG  NEG Final  . Ketones, ur 10/31/2013 NEG  NEG mg/dL Final  . Hgb urine dipstick 10/31/2013 LARGE* NEG Final  . Protein, ur 10/31/2013 NEG  NEG mg/dL Final  . Urobilinogen, UA 10/31/2013 NEG  0.0 - 1.0 mg/dL Final  . Nitrite 10/31/2013 NEG  NEG Final  . Leukocytes, UA 10/31/2013 NEG  NEG Final  . Squamous Epithelial / LPF 10/31/2013  RARE  RARE Final  . Crystals 10/31/2013 NONE SEEN  NONE SEEN Final  . Casts 10/31/2013 NONE SEEN  NONE SEEN Final  . WBC, UA 10/31/2013 0-2  <3 WBC/hpf Final  . RBC / HPF 10/31/2013 7-10* <3 RBC/hpf Final  . Bacteria, UA 10/31/2013 RARE  RARE Final  . Colony Count 10/31/2013 30,000 COLONIES/ML   Final  . Organism ID, Bacteria 10/31/2013 Multiple bacterial morphotypes present, none   Final  . Organism ID, Bacteria 10/31/2013 predominant. Suggest appropriate recollection if    Final  . Organism ID, Bacteria 10/31/2013 clinically indicated.   Final   Serum protein electropheresis, 24 hour urine protein, and myeloma panel are pending.       RADIOGRAPHIC STUDIES: Mr Cervical Spine Wo Contrast  10/27/2013   CLINICAL DATA:  78 year old male with right neck pain. Monoclonal gammopathy or multiple myeloma with left occipital condyle bone lesion status post radiation therapy. Subsequent encounter.  EXAM: MRI CERVICAL SPINE WITHOUT CONTRAST  TECHNIQUE: Multiplanar, multisequence MR imaging of the cervical spine was performed. No intravenous contrast was administered.  COMPARISON:  Brain MRI 05/25/2013.  Cervical spine MRI 12/27/2012.  FINDINGS: Large body habitus. Chronic left occipital condyles bone lesion with decreased T1 signal, but no increased STIR signal today. This has regressed since 2014. Elsewhere the visible skullbase marrow signal is within normal limits. C1 marrow signal is within normal limits. There is mildly decreased T1 marrow signal throughout the other cervical vertebrae, not significantly changed since 2014. A superimposed left T1 vertebral body hemangioma is incidentally re- identified. There is similar mildly decreased T1 marrow signal in the visualized upper thoracic levels. No marrow edema or evidence of acute osseous abnormality.  Cervicomedullary junction is within normal limits. Spinal cord signal is within normal limits at all visualized levels.  Negative paraspinal soft tissues.   Multilevel cervical spine degenerative findings are stable since 2014. There is mild multifactorial degenerative spinal stenosis re- identified from C3-C4 through C6-C7, detailed in 2014. No upper thoracic spinal stenosis.  IMPRESSION: 1. No acute findings in the cervical spine. Stable degenerative disease since 2014. 2. Chronic left occipital condyles lesion status post radiation has regressed since 2014. Other visualized osseous structures are stable.   Electronically Signed   By: Lars Pinks M.D.   On: 10/27/2013 20:33   Mr Thoracic Spine W Wo Contrast  10/27/2013   CLINICAL DATA:  78 year old male with neck and back pain. Multiple myeloma, monoclonal gammopathy. Subsequent encounter.  EXAM: MRI THORACIC SPINE WITHOUT AND WITH CONTRAST  TECHNIQUE: Multiplanar and multiecho pulse sequences of the thoracic spine were obtained without and with intravenous contrast.  CONTRAST:  83m MULTIHANCE GADOBENATE DIMEGLUMINE 529 MG/ML IV SOLN  COMPARISON:  Cervical spine MRIs from the same day and earlier. Chest CT 11/30/2012.  FINDINGS: Cervical spine findings today are reported separately.  T1 vertebral body benign hemangioma better demonstrated on today's cervical exam. There is also a benign vertebral body hemangioma at T12.  There is a small 6 mm enhancing bone lesion with decreased precontrast T1 signal near the central aspect of T4. Posteriorly in that same vertebral body there is also an enhancing lesion on the left (series 10, image 9 and series 11, image 17) measuring 8-10 mm. However, this second lesion does have faint increased T1 signal.  Inferiorly in the T8 vertebral endplate there is an enhancing lesion, but this most resembles a degenerative Schmorl's node.  No other suspicious thoracic bone lesion identified.  Capacious thoracic spinal canal, with no spinal stenosis despite occasional small thoracic central disc protrusions (e.g. T4-T5 and T6-T7 as seen on series 9 image 7. Spinal cord signal is within  normal limits at all visualized levels. No abnormal intradural enhancement.  Posterior paraspinal soft tissues are within normal limits. Negative visualized thoracic and upper abdominal viscera.  IMPRESSION: 1. Central T4 vertebral body 6 mm lesion suspicious for multiple myeloma. 2. No definite additional myelomatous lesion in the thoracic spine; suspect T1, T12, T8, and left posterior T4 signal changes are benign/degenerative. 3. Occasional small thoracic disc herniations, but no thoracic spinal stenosis.   Electronically Signed   By: LLars PinksM.D.   On: 10/27/2013 20:42    ASSESSMENT:   1. Boney lesion of occipital condyle which has regressed after radiation theraopy  2. IgG kappa MGUS  3. MRI scan with myelomatous changes in the multiple thoracic vertebrae T4 and possibly T8   RECOMMENDATIONS:   Awaiting the results of serum and urine protein studies. The level of suspicion for multiple myeloma has increased in light of the scan results.  I have shared this with the patient and my recommendation that he undergo a bone marrow for completeness. I gave a description of the procedure to Mr. HAndreoliand his family. He is inclined to undergo a bone marrow and will defer scheduling it with interventional radiology until after completion of the current blood tests have been discussed with Dr. FBarnet Glasgow              HDarrall Dears MD 11/09/2013 9:17 PM

## 2013-11-10 ENCOUNTER — Encounter (HOSPITAL_COMMUNITY): Payer: Medicare Other

## 2013-11-10 DIAGNOSIS — M899 Disorder of bone, unspecified: Secondary | ICD-10-CM

## 2013-11-10 LAB — PROTEIN, URINE, 24 HOUR
Collection Interval-UPROT: 24 hours
Protein, 24H Urine: 78 mg/d (ref 50–100)
Protein, Urine: 6 mg/dL
Urine Total Volume-UPROT: 1300 mL

## 2013-11-10 LAB — MULTIPLE MYELOMA PANEL, SERUM
Albumin ELP: 56.6 % (ref 55.8–66.1)
Alpha-1-Globulin: 5.3 % — ABNORMAL HIGH (ref 2.9–4.9)
Alpha-2-Globulin: 12.7 % — ABNORMAL HIGH (ref 7.1–11.8)
Beta 2: 4.2 % (ref 3.2–6.5)
Beta Globulin: 6.8 % (ref 4.7–7.2)
Gamma Globulin: 14.4 % (ref 11.1–18.8)
IgA: 170 mg/dL (ref 68–379)
IgG (Immunoglobin G), Serum: 922 mg/dL (ref 650–1600)
IgM, Serum: 32 mg/dL — ABNORMAL LOW (ref 41–251)
M-Spike, %: 0.52 g/dL
Total Protein: 6.6 g/dL (ref 6.0–8.3)

## 2013-11-10 LAB — UIFE/LIGHT CHAINS/TP QN, 24-HR UR
Albumin, U: DETECTED
Alpha 1, Urine: DETECTED — AB
Alpha 2, Urine: DETECTED — AB
Beta, Urine: DETECTED — AB
Gamma Globulin, Urine: DETECTED — AB
Total Protein, Urine: 6 mg/dL (ref 5–25)

## 2013-11-10 LAB — KAPPA/LAMBDA LIGHT CHAINS
Kappa free light chain: 4.42 mg/dL — ABNORMAL HIGH (ref 0.33–1.94)
Kappa, lambda light chain ratio: 2.62 — ABNORMAL HIGH (ref 0.26–1.65)
Lambda free light chains: 1.69 mg/dL (ref 0.57–2.63)

## 2013-11-10 NOTE — Progress Notes (Signed)
24 HR URINE COLLECTED AND RECIEVED

## 2013-11-15 ENCOUNTER — Encounter (HOSPITAL_BASED_OUTPATIENT_CLINIC_OR_DEPARTMENT_OTHER): Payer: Medicare Other

## 2013-11-15 ENCOUNTER — Encounter (HOSPITAL_COMMUNITY): Payer: Self-pay

## 2013-11-15 VITALS — BP 122/72 | HR 69 | Temp 97.8°F | Resp 16 | Wt 239.7 lb

## 2013-11-15 DIAGNOSIS — K219 Gastro-esophageal reflux disease without esophagitis: Secondary | ICD-10-CM

## 2013-11-15 DIAGNOSIS — C9000 Multiple myeloma not having achieved remission: Secondary | ICD-10-CM

## 2013-11-15 DIAGNOSIS — D472 Monoclonal gammopathy: Secondary | ICD-10-CM

## 2013-11-15 DIAGNOSIS — M899 Disorder of bone, unspecified: Secondary | ICD-10-CM | POA: Diagnosis not present

## 2013-11-15 NOTE — Patient Instructions (Signed)
[facility] Discharge Instructions  RECOMMENDATIONS MADE BY THE CONSULTANT AND ANY TEST RESULTS WILL BE SENT TO YOUR REFERRING PHYSICIAN.  EXAM FINDINGS BY THE PHYSICIAN TODAY AND SIGNS OR SYMPTOMS TO REPORT TO CLINIC OR PRIMARY PHYSICIAN: Exam and findings as discussed by Dr. Barnet Glasgow.  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  No treatment is needed at this time.  Will repeat labs in 4 months.  If labs are abnormal we will do a PET scan then if that is abnormal we may need to do a Bone Marrow Biopsy and Aspiration. Report increased pain or pain that has changed.    INSTRUCTIONS/FOLLOW-UP: Follow-up in 4 months with labs and office visit.  Thank you for choosing [facility] to provide your oncology and hematology care.  To afford each patient quality time with our providers, please arrive at least 15 minutes before your scheduled appointment time.  With your help, our goal is to use those 15 minutes to complete the necessary work-up to ensure our physicians have the information they need to help with your evaluation and healthcare recommendations.    Effective January 1st, 2014, we ask that you re-schedule your appointment with our physicians should you arrive 10 or more minutes late for your appointment.  We strive to give you quality time with our providers, and arriving late affects you and other patients whose appointments are after yours.    Again, thank you for choosing [facility].  Our hope is that these requests will decrease the amount of time that you wait before being seen by our physicians.       _____________________________________________________________  Should you have questions after your visit to [facility], please contact our office at [phone] between the hours of 8:30 a.m. and 4:30 p.m.  Voicemails left after 4:30 p.m. will not be returned until the following business day.  For prescription  refill requests, have your pharmacy contact our office with your prescription refill request.    _______________________________________________________________  We hope that we have given you very good care.  You may receive a patient satisfaction survey in the mail, please complete it and return it as soon as possible.  We value your feedback!  _______________________________________________________________  Have you asked about our STAR program?  STAR stands for Survivorship Training and Rehabilitation, and this is a nationally recognized cancer care program that focuses on survivorship and rehabilitation.  Cancer and cancer treatments may cause problems, such as, pain, making you feel tired and keeping you from doing the things that you need or want to do. Cancer rehabilitation can help. Our goal is to reduce these troubling effects and help you have the best quality of life possible.  You may receive a survey from a nurse that asks questions about your current state of health.  Based on the survey results, all eligible patients will be referred to the [facility] program for an evaluation so we can better serve you!  A frequently asked questions sheet is available upon request.

## 2013-11-15 NOTE — Progress Notes (Signed)
[facility]  OFFICE PROGRESS NOTE  [name], MD [address] [place] [address]  DIAGNOSIS: Skull lesion  Smoldering multiple myeloma  Gastroesophageal reflux disease without esophagitis  Chief Complaint  Patient presents with  . Skull lesion    CURRENT THERAPY: Definitive radiotherapy 01/05/2013 through 02/10/2013 to left occipital condyle region, 45 gray in 25 fractions, no tissue diagnosis were occipital epicondyles syndrome.  INTERVAL HISTORY: Romolo Sieling 78 y.o. male returns for left occipital condyle syndrome, status post definitive radiotherapy 01/05/2013 through 02/10/2013 to the left occipital condyle region, 45 gray in 25 fractions, no tissue diagnosis. He does manifest an IgG kappa monoclonal protein with normal total IgG. He experiences neck discomfort off and on relieved with BC powders. He denies any nausea, vomiting, diarrhea, constipation, melena, hematochezia, hematuria, epistaxis, difficulty with phonation, drooling, or dysphagia.    MEDICAL HISTORY: Past Medical History  Diagnosis Date  . Neuropathy   . BPH (benign prostatic hyperplasia)   . GERD (gastroesophageal reflux disease)   . H/O ETOH abuse   . Skull lesion     Left occipital condyle  . History of radiation therapy 01/05/13-02/10/13    45 gray to left occipital condyle region    INTERIM HISTORY: has Neuropathy; BPH (benign prostatic hyperplasia); GERD (gastroesophageal reflux disease); H/O ETOH abuse; Skull lesion; Neck pain; Fatigue; Loss of weight; Insomnia; and MGUS (monoclonal gammopathy of unknown significance) on his problem list.    ALLERGIES:  is allergic to codeine and morphine and related.  MEDICATIONS: has a current medication list which includes the following prescription(s): alprazolam, aspirin-salicylamide-caffeine, vitamin d3, ciprofloxacin, gabapentin, and tamsulosin.  SURGICAL HISTORY:  Past Surgical History    Procedure Laterality Date  . Cyst excision  1959    tail bone    FAMILY HISTORY: family history includes Diabetes in his mother; Hypertension in his father; Stroke in his mother.  SOCIAL HISTORY:  reports that he has never smoked. He has never used smokeless tobacco. He reports that he does not drink alcohol or use illicit drugs.  REVIEW OF SYSTEMS:  Other than that discussed above is noncontributory.  PHYSICAL EXAMINATION: ECOG PERFORMANCE STATUS: 1 - Symptomatic but completely ambulatory  Blood pressure 122/72, pulse 69, temperature 97.8 F (36.6 C), temperature source Oral, resp. rate 16, weight 239 lb 11.2 oz (108.727 kg), SpO2 97.00%.  GENERAL:alert, no distress and comfortable SKIN: skin color, texture, turgor are normal, no rashes or significant lesions EYES: PERLA; Conjunctiva are pink and non-injected, sclera clear SINUSES: No redness or tenderness over maxillary or ethmoid sinuses OROPHARYNX:no exudate, no erythema on lips, buccal mucosa, or tongue. NECK: supple, thyroid normal size, non-tender, without nodularity. No masses. Full range of motion of the neck. CHEST: Increased AP diameter with no breast masses. LYMPH:  no palpable lymphadenopathy in the cervical, axillary or inguinal LUNGS: clear to auscultation and percussion with normal breathing effort HEART: regular rate & rhythm and no murmurs. ABDOMEN:abdomen soft, non-tender and normal bowel sounds MUSCULOSKELETAL:no cyanosis of digits and no clubbing. Range of motion normal.  NEURO: alert & oriented x 3 with fluent speech, no focal motor/sensory deficits   LABORATORY DATA: Lab on 11/10/2013  Component Date Value Ref Range Status  . Urine Total Volume-UPROT 11/10/2013 1300   Final  . Collection Interval-UPROT 11/10/2013 24   Final  . Protein, Urine 11/10/2013 6   Final  . Protein, 24H Urine 11/10/2013 78  50 - 100  mg/day Final  Lab on 11/08/2013  Component Date Value Ref Range Status  . WBC 11/08/2013 7.5   4.0 - 10.5 K/uL Final  . RBC 11/08/2013 5.28  4.22 - 5.81 MIL/uL Final  . Hemoglobin 11/08/2013 14.9  13.0 - 17.0 g/dL Final  . HCT 11/08/2013 45.3  39.0 - 52.0 % Final  . MCV 11/08/2013 85.8  78.0 - 100.0 fL Final  . MCH 11/08/2013 28.2  26.0 - 34.0 pg Final  . MCHC 11/08/2013 32.9  30.0 - 36.0 g/dL Final  . RDW 11/08/2013 14.9  11.5 - 15.5 % Final  . Platelets 11/08/2013 185  150 - 400 K/uL Final  . Neutrophils Relative % 11/08/2013 46  43 - 77 % Final  . Neutro Abs 11/08/2013 3.5  1.7 - 7.7 K/uL Final  . Lymphocytes Relative 11/08/2013 43  12 - 46 % Final  . Lymphs Abs 11/08/2013 3.2  0.7 - 4.0 K/uL Final  . Monocytes Relative 11/08/2013 9  3 - 12 % Final  . Monocytes Absolute 11/08/2013 0.6  0.1 - 1.0 K/uL Final  . Eosinophils Relative 11/08/2013 2  0 - 5 % Final  . Eosinophils Absolute 11/08/2013 0.1  0.0 - 0.7 K/uL Final  . Basophils Relative 11/08/2013 0  0 - 1 % Final  . Basophils Absolute 11/08/2013 0.0  0.0 - 0.1 K/uL Final  . Sodium 11/08/2013 141  137 - 147 mEq/L Final  . Potassium 11/08/2013 4.5  3.7 - 5.3 mEq/L Final  . Chloride 11/08/2013 104  96 - 112 mEq/L Final  . CO2 11/08/2013 25  19 - 32 mEq/L Final  . Glucose, Bld 11/08/2013 99  70 - 99 mg/dL Final  . BUN 11/08/2013 13  6 - 23 mg/dL Final  . Creatinine, Ser 11/08/2013 0.84  0.50 - 1.35 mg/dL Final  . Calcium 11/08/2013 9.2  8.4 - 10.5 mg/dL Final  . Total Protein 11/08/2013 7.0  6.0 - 8.3 g/dL Final  . Albumin 11/08/2013 3.4* 3.5 - 5.2 g/dL Final  . AST 11/08/2013 13  0 - 37 U/L Final  . ALT 11/08/2013 8  0 - 53 U/L Final  . Alkaline Phosphatase 11/08/2013 35* 39 - 117 U/L Final  . Total Bilirubin 11/08/2013 0.5  0.3 - 1.2 mg/dL Final  . GFR calc non Af Amer 11/08/2013 83* >90 mL/min Final  . GFR calc Af Amer 11/08/2013 >90  >90 mL/min Final   Comment: (NOTE)                          The eGFR has been calculated using the CKD EPI equation.                          This calculation has not been validated in  all clinical situations.                          eGFR's persistently <90 mL/min signify possible Chronic Kidney                          Disease.  . Anion gap 11/08/2013 12  5 - 15 Final  . Total Protein 11/08/2013 6.6  6.0 - 8.3 g/dL Final  . Albumin ELP 11/08/2013 56.6  55.8 - 66.1 % Final  . Alpha-1-Globulin 11/08/2013 5.3* 2.9 - 4.9 % Final  . Alpha-2-Globulin 11/08/2013 12.7* 7.1 -  11.8 % Final  . Beta Globulin 11/08/2013 6.8  4.7 - 7.2 % Final  . Beta 2 11/08/2013 4.2  3.2 - 6.5 % Final  . Gamma Globulin 11/08/2013 14.4  11.1 - 18.8 % Final  . M-Spike, % 11/08/2013 0.52   Final  . SPE Interp. 11/08/2013 (NOTE)   Final   Comment: A restricted band consistent with monoclonal protein is present.                          The monoclonal protein peak accounts for 0.52 g/dL of the total                          0.95 g/dL of protein in the gamma region.                           Results are consistent with SPE performed on 10/05/2013                           Reviewed by Odis Hollingshead, MD, PhD, FCAP (Electronic Signature on                          File)  . Comment 11/08/2013 (NOTE)   Final   Comment: ---------------                          Serum protein electrophoresis is a useful screening procedure in the                          detection of various pathophysiologic states such as inflammation,                          gammopathies, protein loss and other dysproteinemias.  Immunofixation                          electrophoresis (IFE) is a more sensitive technique for the                          identification of M-proteins found in patients with monoclonal                          gammopathy of unknown significance (MGUS), amyloidosis, early or                          treated myeloma or macroglobulinemia, solitary plasmacytoma or                          extramedullary plasmacytoma.  . IgG (Immunoglobin G), Serum 11/08/2013 922  650 - 1600 mg/dL Final  . IgA 11/08/2013 170  68  - 379 mg/dL Final  . IgM, Serum 11/08/2013 32* 41 - 251 mg/dL Final  . Immunofix Electr Int 11/08/2013 (NOTE)   Final   Comment: Monoclonal IgG kappa protein is present.                          Reviewed by Odis Hollingshead, MD, PhD, FCAP (Electronic Signature on  File)                          Performed at [facility]  . Kappa free light chain 11/08/2013 4.42* 0.33 - 1.94 mg/dL Final  . Lamda free light chains 11/08/2013 1.69  0.57 - 2.63 mg/dL Final  . Kappa, lamda light chain ratio 11/08/2013 2.62* 0.26 - 1.65 Final   Performed at [facility] Visit on 11/08/2013  Component Date Value Ref Range Status  . Time 11/08/2013 RANDOM   Corrected   CORRECTED ON 10/08 AT 1357: PREVIOUSLY REPORTED AS 24  . Volume, Urine 11/08/2013 RANDOM   Corrected   CORRECTED ON 10/08 AT 1357: PREVIOUSLY REPORTED AS 44  . Total Protein, Urine 11/08/2013 6  5 - 25 mg/dL Final   No established reference range.  . Total Protein, Urine-Ur/day 11/08/2013 NOT CALC  <150 mg/day Final   Comment: (NOTE)                          Total urinary protein is determined by adding the albumin and Kappa                          and/or Lambda light chains.  This value may not agree with the total                          protein as determined by chemical methods, which characteristically                          underestimate urinary light chains.  . Albumin, U 11/08/2013 DETECTED  DETECTED Final  . Alpha 1, Urine 11/08/2013 DETECTED* NONE DETECTED Final  . Alpha 2, Urine 11/08/2013 DETECTED* NONE DETECTED Final  . Beta, Urine 11/08/2013 DETECTED* NONE DETECTED Final  . Gamma Globulin, Urine 11/08/2013 DETECTED* NONE DETECTED Final  . Immunofixation, Urine 11/08/2013 (NOTE)   Final   Comment: Area of slightly restricted mobility in the IgG and Kappa lanes.                          Suggest repeat in 6-8 months, if clinically indicated.                          Reviewed by  Odis Hollingshead, MD, PhD, FCAP (Electronic Signature on                          File)                          Performed at [facility] Visit on 10/31/2013  Component Date Value Ref Range Status  . Color, Urine 10/31/2013 YELLOW  YELLOW Final  . APPearance 10/31/2013 CLEAR  CLEAR Final  . Specific Gravity, Urine 10/31/2013 1.020  1.005 - 1.030 Final  . pH 10/31/2013 5.5  5.0 - 8.0 Final  . Glucose, UA 10/31/2013 NEG  NEG mg/dL Final  . Bilirubin Urine 10/31/2013 NEG  NEG Final  . Ketones, ur 10/31/2013 NEG  NEG mg/dL Final  . Hgb urine dipstick 10/31/2013 LARGE* NEG Final  . Protein, ur 10/31/2013 NEG  NEG mg/dL Final  . Urobilinogen, UA  10/31/2013 NEG  0.0 - 1.0 mg/dL Final  . Nitrite 10/31/2013 NEG  NEG Final  . Leukocytes, UA 10/31/2013 NEG  NEG Final  . Squamous Epithelial / LPF 10/31/2013 RARE  RARE Final  . Crystals 10/31/2013 NONE SEEN  NONE SEEN Final  . Casts 10/31/2013 NONE SEEN  NONE SEEN Final  . WBC, UA 10/31/2013 0-2  <3 WBC/hpf Final  . RBC / HPF 10/31/2013 7-10* <3 RBC/hpf Final  . Bacteria, UA 10/31/2013 RARE  RARE Final  . Colony Count 10/31/2013 30,000 COLONIES/ML   Final  . Organism ID, Bacteria 10/31/2013 Multiple bacterial morphotypes present, none   Final  . Organism ID, Bacteria 10/31/2013 predominant. Suggest appropriate recollection if    Final  . Organism ID, Bacteria 10/31/2013 clinically indicated.   Final    PATHOLOGY: No new pathology.   Urinalysis    Component Value Date/Time   COLORURINE YELLOW 10/31/2013 0846   APPEARANCEUR CLEAR 10/31/2013 0846   LABSPEC 1.020 10/31/2013 0846   PHURINE 5.5 10/31/2013 0846   GLUCOSEU NEG 10/31/2013 0846   HGBUR LARGE* 10/31/2013 0846   BILIRUBINUR NEG 10/31/2013 0846   KETONESUR NEG 10/31/2013 0846   PROTEINUR NEG 10/31/2013 0846   UROBILINOGEN NEG 10/31/2013 0846   NITRITE NEG 10/31/2013 0846   LEUKOCYTESUR NEG 10/31/2013 0846    RADIOGRAPHIC STUDIES: Mr Cervical Spine Wo  Contrast  10/27/2013   CLINICAL DATA:  78 year old male with right neck pain. Monoclonal gammopathy or multiple myeloma with left occipital condyle bone lesion status post radiation therapy. Subsequent encounter.  EXAM: MRI CERVICAL SPINE WITHOUT CONTRAST  TECHNIQUE: Multiplanar, multisequence MR imaging of the cervical spine was performed. No intravenous contrast was administered.  COMPARISON:  Brain MRI 05/25/2013.  Cervical spine MRI 12/27/2012.  FINDINGS: Large body habitus. Chronic left occipital condyles bone lesion with decreased T1 signal, but no increased STIR signal today. This has regressed since 2014. Elsewhere the visible skullbase marrow signal is within normal limits. C1 marrow signal is within normal limits. There is mildly decreased T1 marrow signal throughout the other cervical vertebrae, not significantly changed since 2014. A superimposed left T1 vertebral body hemangioma is incidentally re- identified. There is similar mildly decreased T1 marrow signal in the visualized upper thoracic levels. No marrow edema or evidence of acute osseous abnormality.  Cervicomedullary junction is within normal limits. Spinal cord signal is within normal limits at all visualized levels.  Negative paraspinal soft tissues.  Multilevel cervical spine degenerative findings are stable since 2014. There is mild multifactorial degenerative spinal stenosis re- identified from C3-C4 through C6-C7, detailed in 2014. No upper thoracic spinal stenosis.  IMPRESSION: 1. No acute findings in the cervical spine. Stable degenerative disease since 2014. 2. Chronic left occipital condyles lesion status post radiation has regressed since 2014. Other visualized osseous structures are stable.   Electronically Signed   By: Lars Pinks M.D.   On: 10/27/2013 20:33   Mr Thoracic Spine W Wo Contrast  10/27/2013   CLINICAL DATA:  78 year old male with neck and back pain. Multiple myeloma, monoclonal gammopathy. Subsequent encounter.  EXAM:  MRI THORACIC SPINE WITHOUT AND WITH CONTRAST  TECHNIQUE: Multiplanar and multiecho pulse sequences of the thoracic spine were obtained without and with intravenous contrast.  CONTRAST:  78m MULTIHANCE GADOBENATE DIMEGLUMINE 529 MG/ML IV SOLN  COMPARISON:  Cervical spine MRIs from the same day and earlier. Chest CT 11/30/2012.  FINDINGS: Cervical spine findings today are reported separately.  T1 vertebral body benign hemangioma better demonstrated on today's cervical exam.  There is also a benign vertebral body hemangioma at T12.  There is a small 6 mm enhancing bone lesion with decreased precontrast T1 signal near the central aspect of T4. Posteriorly in that same vertebral body there is also an enhancing lesion on the left (series 10, image 9 and series 11, image 17) measuring 8-10 mm. However, this second lesion does have faint increased T1 signal.  Inferiorly in the T8 vertebral endplate there is an enhancing lesion, but this most resembles a degenerative Schmorl's node.  No other suspicious thoracic bone lesion identified.  Capacious thoracic spinal canal, with no spinal stenosis despite occasional small thoracic central disc protrusions (e.g. T4-T5 and T6-T7 as seen on series 9 image 7. Spinal cord signal is within normal limits at all visualized levels. No abnormal intradural enhancement.  Posterior paraspinal soft tissues are within normal limits. Negative visualized thoracic and upper abdominal viscera.  IMPRESSION: 1. Central T4 vertebral body 6 mm lesion suspicious for multiple myeloma. 2. No definite additional myelomatous lesion in the thoracic spine; suspect T1, T12, T8, and left posterior T4 signal changes are benign/degenerative. 3. Occasional small thoracic disc herniations, but no thoracic spinal stenosis.   Electronically Signed   By: Lars Pinks M.D.   On: 10/27/2013 20:42    ASSESSMENT:  #1. Smouldering myeloma with involvement of T4 on MRI with kappa to lambda ratio 2.62 and no evidence of  Bence Jones proteinuria. Kidney function and calcium are normal. No tissue diagnosis. #2. Occipital condyle syndrome, status post radiotherapy, improved. #3. Gastroesophageal reflux disease, controlled. #4. Benign prostatic hypertrophy, asymptomatic.  PLAN:  #1. The patient and his family were reassured. #2. Followup testing every 4 months and if suspicious for progressive myeloma, wIill do a PET scan to assess total body bone involvement and perform bone marrow aspiration and biopsy at that time with cytogenetics. #3. Suggest  using Aleve in the morning before doing any strenuous work during the day to lessen discomfort in the neck and to take an additional dose at bedtime. #4. Followup in 4 months with CBC, chem profile, LDH, myeloma panel, light chains.   All questions were answered. The patient knows to call the clinic with any problems, questions or concerns. We can certainly see the patient much sooner if necessary.   I spent 30 minutes counseling the patient face to face. The total time spent in the appointment was 40 minutes.    Doroteo Bradford, MD 11/15/2013 3:03 PM  DISCLAIMER:  This note was dictated with voice recognition software.  Similar sounding words can inadvertently be transcribed inaccurately and may not be corrected upon review.

## 2013-11-22 ENCOUNTER — Other Ambulatory Visit: Payer: Self-pay | Admitting: Family Medicine

## 2013-12-23 ENCOUNTER — Telehealth: Payer: Self-pay | Admitting: Family Medicine

## 2013-12-23 MED ORDER — HYDROCODONE-ACETAMINOPHEN 5-325 MG PO TABS: 1.0000 | ORAL_TABLET | Freq: Four times a day (QID) | ORAL | Status: DC | PRN

## 2013-12-23 NOTE — Telephone Encounter (Signed)
Pt called and states that he thinks he has another kidney stone and wants to know if we can call him in something for pain?

## 2013-12-23 NOTE — Telephone Encounter (Signed)
Call pt and let him know we will prescribe norco 5-325mg  1 po q hrs  If his pain gets worse go to ER Make sure he is taking flomax Pt notified by nurse

## 2013-12-28 ENCOUNTER — Telehealth: Payer: Self-pay | Admitting: Family Medicine

## 2013-12-28 NOTE — Telephone Encounter (Signed)
States pulled  muscle  In his back a few days ago working around the yard.  Rx for Norco was given to him on 11/20.  He has continued to use back and be active.  C/O back still hurts.  Told he needs to rest back.  Use pain meds as needed.  Also take some Ibuprofen or naproxen also.  If still not better by Monday then we will need to see him.

## 2014-01-02 ENCOUNTER — Ambulatory Visit (INDEPENDENT_AMBULATORY_CARE_PROVIDER_SITE_OTHER): Payer: Medicare Other | Admitting: Family Medicine

## 2014-01-02 ENCOUNTER — Ambulatory Visit
Admission: RE | Admit: 2014-01-02 | Discharge: 2014-01-02 | Disposition: A | Discharge status: Home / Self Care | Payer: Medicare Other | Source: Ambulatory Visit | Attending: Family Medicine | Admitting: Family Medicine

## 2014-01-02 ENCOUNTER — Encounter: Payer: Self-pay | Admitting: Family Medicine

## 2014-01-02 VITALS — BP 118/72 | HR 78 | Temp 97.8°F | Resp 20 | Ht 69.0 in | Wt 240.0 lb

## 2014-01-02 DIAGNOSIS — M545 Low back pain: Secondary | ICD-10-CM

## 2014-01-02 DIAGNOSIS — S32040A Wedge compression fracture of fourth lumbar vertebra, initial encounter for closed fracture: Secondary | ICD-10-CM | POA: Diagnosis not present

## 2014-01-02 DIAGNOSIS — S22080A Wedge compression fracture of T11-T12 vertebra, initial encounter for closed fracture: Secondary | ICD-10-CM | POA: Diagnosis not present

## 2014-01-02 DIAGNOSIS — S32020A Wedge compression fracture of second lumbar vertebra, initial encounter for closed fracture: Secondary | ICD-10-CM | POA: Diagnosis not present

## 2014-01-02 DIAGNOSIS — S32030A Wedge compression fracture of third lumbar vertebra, initial encounter for closed fracture: Secondary | ICD-10-CM | POA: Diagnosis not present

## 2014-01-02 LAB — URINALYSIS, ROUTINE W REFLEX MICROSCOPIC
Bilirubin Urine: NEGATIVE
Glucose, UA: NEGATIVE mg/dL
Hgb urine dipstick: NEGATIVE
Ketones, ur: NEGATIVE mg/dL
Leukocytes, UA: NEGATIVE
Nitrite: NEGATIVE
Protein, ur: NEGATIVE mg/dL
Specific Gravity, Urine: 1.02 (ref 1.005–1.030)
Urobilinogen, UA: 0.2 mg/dL (ref 0.0–1.0)
pH: 6 (ref 5.0–8.0)

## 2014-01-02 MED ORDER — OXYCODONE-ACETAMINOPHEN 10-325 MG PO TABS: 1.0000 | ORAL_TABLET | Freq: Three times a day (TID) | ORAL | Status: DC | PRN

## 2014-01-02 MED ORDER — PREDNISONE 20 MG PO TABS: ORAL_TABLET | ORAL | Status: DC

## 2014-01-02 NOTE — Progress Notes (Signed)
Subjective:    Patient ID: Dean [name], male    DOB: 1935-04-18, 78 y.o.   MRN: [id number]  HPI Patient was seen 3 weeks ago for low back pain. The pain is located at the level of L1-L2. It is significantly worse with forward flexion.  He denies any sciatica. He denies any leg weakness or numbness. He denies any hematuria or dysuria however he does complain of some mild pain in his left testicle. On examination today there is no tenderness to palpation of the left testicle. It is not swollen. Cremasteric reflexes present. There is no evidence of an inguinal hernia. Urinalysis today in office is negative for blood, nitrites, leukocyte esterase. Prostate exam today in office is normal. There is no tenderness to palpation of his prostate. There is no nodularity. He does have BPH. Patient's pain is reproduced with palpation of the lumbar paraspinal muscles. He has decreased range of motion. There is no evidence of weakness in his legs. His medical history is significant for a skull lesion and MGUS.   Past Medical History  Diagnosis Date  . Neuropathy   . BPH (benign prostatic hyperplasia)   . GERD (gastroesophageal reflux disease)   . H/O ETOH abuse   . Skull lesion     Left occipital condyle  . History of radiation therapy 01/05/13-02/10/13    45 gray to left occipital condyle region   Past Surgical History  Procedure Laterality Date  . Cyst excision  1959    tail bone   Current Outpatient Prescriptions on File Prior to Visit  Medication Sig Dispense Refill  . ALPRAZolam (XANAX) 1 MG tablet Take 1 tablet (1 mg total) by mouth 2 (two) times daily as needed. For anxiety and/or sleep 60 tablet 0  . Aspirin-Salicylamide-Caffeine (BC HEADACHE POWDER PO) Take 1 packet by mouth 2 (two) times daily as needed.    . Cholecalciferol (VITAMIN D3) 1000 UNITS CHEW Chew 1 tablet by mouth daily.    . ciprofloxacin (CIPRO) 500 MG tablet Take 1 tablet (500 mg total) by mouth 2 (two) times daily. 60 tablet  0  . gabapentin (NEURONTIN) 400 MG capsule Take 1 capsule (400 mg total) by mouth 4 (four) times daily. May take up to five times daily depending on pain in feet 180 capsule 5  . HYDROcodone-acetaminophen (NORCO) 5-325 MG per tablet Take 1 tablet by mouth every 6 (six) hours as needed for moderate pain. 30 tablet 0  . tamsulosin (FLOMAX) 0.4 MG CAPS capsule TAKE ONE CAPSULE EVERY EVENING. 30 capsule 5   No current facility-administered medications on file prior to visit.   Allergies  Allergen Reactions  . Codeine Other (See Comments)    Severe headache  . Morphine And Related Other (See Comments)    Makes him feel weird   History   Social History  . Marital Status: Married    Spouse Name: N/A    Number of Children: 3  . Years of Education: N/A   Occupational History  .     Social History Main Topics  . Smoking status: Never Smoker   . Smokeless tobacco: Never Used  . Alcohol Use: No     Comment: " Not much no more"  . Drug Use: No  . Sexual Activity: Not on file   Other Topics Concern  . Not on file   Social History Narrative      Review of Systems  All other systems reviewed and are negative.  Objective:   Physical Exam  Cardiovascular: Normal rate, regular rhythm and normal heart sounds.   Pulmonary/Chest: Effort normal and breath sounds normal. No respiratory distress. He has no wheezes. He has no rales.  Abdominal: Hernia confirmed negative in the right inguinal area and confirmed negative in the left inguinal area.  Genitourinary: Rectum normal, testes normal and penis normal. Prostate is enlarged. Prostate is not tender. Cremasteric reflex is present. Right testis shows no mass, no swelling and no tenderness. Left testis shows no mass, no swelling and no tenderness.  Musculoskeletal:       Lumbar back: He exhibits decreased range of motion, tenderness and pain. He exhibits no bony tenderness, no deformity and no spasm.  Neurological: He has normal  strength. He displays normal reflexes. No sensory deficit.  Vitals reviewed.  -cvat.       Assessment & Plan:  Low back pain without sciatica, unspecified back pain laterality - Plan: Urinalysis, Routine w reflex microscopic, DG Lumbar Spine Complete, predniSONE (DELTASONE) 20 MG tablet, oxyCODONE-acetaminophen (PERCOCET) 10-325 MG per tablet  I believe the patient's pain is musculoskeletal in nature. I believe he has suffered a muscle strain versus a herniated nucleus pulposus. I will prescribe prednisone taper pack as well as Percocet 10/325 one by mouth every 6 hours when necessary pain. Given his past medical history I'll also obtain an x-ray of the lumbar spine to rule out lytic lesions in the lower spine. If patient's pain persists next week consider an MRI of the lumbar spine. I do not believe that this is nephrolithiasis, pyelonephritis, or prostatitis.

## 2014-01-05 ENCOUNTER — Ambulatory Visit
Admission: RE | Admit: 2014-01-05 | Discharge: 2014-01-05 | Disposition: A | Discharge status: Home / Self Care | Payer: Medicare Other | Source: Ambulatory Visit | Attending: Radiation Oncology | Admitting: Radiation Oncology

## 2014-01-05 ENCOUNTER — Encounter: Payer: Self-pay | Admitting: Radiation Oncology

## 2014-01-05 ENCOUNTER — Other Ambulatory Visit: Payer: Self-pay | Admitting: Family Medicine

## 2014-01-05 VITALS — BP 125/70 | HR 59 | Temp 97.4°F | Resp 20 | Ht 69.0 in | Wt 241.6 lb

## 2014-01-05 DIAGNOSIS — M899 Disorder of bone, unspecified: Secondary | ICD-10-CM | POA: Diagnosis not present

## 2014-01-05 DIAGNOSIS — T07XXXA Unspecified multiple injuries, initial encounter: Secondary | ICD-10-CM

## 2014-01-05 MED ORDER — ALPRAZOLAM 1 MG PO TABS: 1.0000 mg | ORAL_TABLET | Freq: Two times a day (BID) | ORAL | Status: DC | PRN

## 2014-01-05 MED ORDER — GABAPENTIN 400 MG PO CAPS: 400.0000 mg | ORAL_CAPSULE | Freq: Four times a day (QID) | ORAL | Status: DC

## 2014-01-05 NOTE — Progress Notes (Signed)
Dean [name] here for follow up after treatment to his left occipital condyle lesion.  He reports pain in his lower back at a 8/10.  He reports the pain started 2 months ago.  He had a scan on Monday which shows a compression fracture in L4.  He is taking percocet 10/325 1-3 times a day and prednisone dose Pak.  He reports having sweats from the prednisone.   He reports his neck pain has improved.  He reports his range of motion his improved.  He reports his taste buds are not back yet.  He can taste salty and sweet foods.  He reports; his mouth is dry.  His weight is stable from his last visit.  He denies numbness in his arms and legs but is having trouble walking.

## 2014-01-05 NOTE — Progress Notes (Signed)
Radiation Oncology         [phone] ________________________________  Name: Dean [name] MRN: [id number]  Date: 01/05/2014  DOB: 1935-06-11  Follow-Up Visit Note  CC: [name], MD  Farrel Gobble, MD    ICD-9-CM ICD-10-CM   1. Skull lesion 733.90 M89.9     Diagnosis:   Multiple myeloma  Interval Since Last Radiation:  11  months  Narrative:  The patient returns today for routine follow-up.  His pain in the upper neck and occipital area has essentially resolved at this time. Since his last followup the patient has been diagnosed with multiple myeloma "smoldering" per medical oncology.  Patient recently experienced a compression fracture of his L4 area.  If it Turns out to be related to his myeloma then certainly radiation therapy could help with his pain issues.                             ALLERGIES:  is allergic to codeine and morphine and related.  Meds: Current Outpatient Prescriptions  Medication Sig Dispense Refill  . ALPRAZolam (XANAX) 1 MG tablet Take 1 tablet (1 mg total) by mouth 2 (two) times daily as needed. For anxiety and/or sleep 60 tablet 0  . Aspirin-Salicylamide-Caffeine (BC HEADACHE POWDER PO) Take 1 packet by mouth 2 (two) times daily as needed.    . gabapentin (NEURONTIN) 400 MG capsule Take 1 capsule (400 mg total) by mouth 4 (four) times daily. May take up to five times daily depending on pain in feet 180 capsule 5  . oxyCODONE-acetaminophen (PERCOCET) 10-325 MG per tablet Take 1 tablet by mouth every 8 (eight) hours as needed for pain. 30 tablet 0  . predniSONE (DELTASONE) 20 MG tablet 3 tabs poqday 1-2, 2 tabs poqday 3-4, 1 tab poqday 5-6 12 tablet 0  . tamsulosin (FLOMAX) 0.4 MG CAPS capsule TAKE ONE CAPSULE EVERY EVENING. 30 capsule 5  . Cholecalciferol (VITAMIN D3) 1000 UNITS CHEW Chew 1 tablet by mouth daily.    Marland Kitchen HYDROcodone-acetaminophen (NORCO) 5-325 MG per tablet Take 1 tablet by mouth every 6 (six) hours as needed for moderate  pain. (Patient not taking: Reported on 01/05/2014) 30 tablet 0   No current facility-administered medications for this encounter.    Physical Findings: The patient is in no acute distress. Patient is alert and oriented.  height is _0  (1.753 m) and weight is 241 lb 9.6 oz (109.589 kg). His oral temperature is 97.4 F (36.3 C). His blood pressure is 125/70 and his pulse is 59. His respiration is 20. .  The lungs are clear. The heart has a regular rhythm and rate. The oral cavity is moist without secondary infection. Palpation along the occipital area reveals no point tenderness.  Lab Findings: Lab Results  Component Value Date   WBC 7.5 11/08/2013   HGB 14.9 11/08/2013   HCT 45.3 11/08/2013   MCV 85.8 11/08/2013   PLT 185 11/08/2013    Radiographic Findings: Dg Lumbar Spine Complete  01/02/2014   CLINICAL DATA:  Injured back 3 weeks ago.  Persistent pain.  EXAM: LUMBAR SPINE - COMPLETE 4+ VIEW  COMPARISON:  Radiographs 12/01/2012  FINDINGS: Normal overall alignment of the lumbar vertebral bodies. Stable remote compression fractures involving T12, L2 and L3. There is a new compression fracture of L4 since the prior radiographs. Stable facet disease without definite pars defects. The visualized bony pelvis is intact.  IMPRESSION: Multiple compression fractures. New  L4 compression fracture since prior radiographs 12/01/2012.   Electronically Signed   By: Kalman Jewels M.D.   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 The patient is doing much better as far as this issue is concerned.  Plan:  When necessary followup in radiation oncology. The patient will continue close followup in medical oncology.    ____________________________________ Blair Promise, MD

## 2014-01-09 ENCOUNTER — Encounter: Payer: Self-pay | Admitting: *Deleted

## 2014-01-16 ENCOUNTER — Encounter: Payer: Self-pay | Admitting: Family Medicine

## 2014-01-16 ENCOUNTER — Ambulatory Visit: Payer: Medicare Other | Admitting: Family Medicine

## 2014-01-16 ENCOUNTER — Ambulatory Visit (INDEPENDENT_AMBULATORY_CARE_PROVIDER_SITE_OTHER): Payer: Medicare Other | Admitting: Family Medicine

## 2014-01-16 VITALS — BP 126/68 | HR 66 | Temp 97.6°F | Resp 14 | Ht 66.0 in | Wt 237.0 lb

## 2014-01-16 DIAGNOSIS — S32000D Wedge compression fracture of unspecified lumbar vertebra, subsequent encounter for fracture with routine healing: Secondary | ICD-10-CM | POA: Diagnosis not present

## 2014-01-16 DIAGNOSIS — R5382 Chronic fatigue, unspecified: Secondary | ICD-10-CM | POA: Diagnosis not present

## 2014-01-16 DIAGNOSIS — J309 Allergic rhinitis, unspecified: Secondary | ICD-10-CM | POA: Diagnosis not present

## 2014-01-16 DIAGNOSIS — M545 Low back pain: Secondary | ICD-10-CM

## 2014-01-16 DIAGNOSIS — L309 Dermatitis, unspecified: Secondary | ICD-10-CM | POA: Diagnosis not present

## 2014-01-16 DIAGNOSIS — Z87312 Personal history of (healed) stress fracture: Secondary | ICD-10-CM | POA: Insufficient documentation

## 2014-01-16 LAB — COMPREHENSIVE METABOLIC PANEL
ALT: 9 U/L (ref 0–53)
AST: 11 U/L (ref 0–37)
Albumin: 3.6 g/dL (ref 3.5–5.2)
Alkaline Phosphatase: 38 U/L — ABNORMAL LOW (ref 39–117)
BUN: 14 mg/dL (ref 6–23)
CO2: 26 mEq/L (ref 19–32)
Calcium: 9 mg/dL (ref 8.4–10.5)
Chloride: 106 mEq/L (ref 96–112)
Creat: 0.84 mg/dL (ref 0.50–1.35)
Glucose, Bld: 88 mg/dL (ref 70–99)
Potassium: 4.2 mEq/L (ref 3.5–5.3)
Sodium: 138 mEq/L (ref 135–145)
Total Bilirubin: 0.4 mg/dL (ref 0.2–1.2)
Total Protein: 6.1 g/dL (ref 6.0–8.3)

## 2014-01-16 LAB — CBC WITH DIFFERENTIAL/PLATELET
Basophils Absolute: 0 10*3/uL (ref 0.0–0.1)
Basophils Relative: 0 % (ref 0–1)
Eosinophils Absolute: 0.1 10*3/uL (ref 0.0–0.7)
Eosinophils Relative: 2 % (ref 0–5)
HCT: 46 % (ref 39.0–52.0)
Hemoglobin: 15.2 g/dL (ref 13.0–17.0)
Lymphocytes Relative: 47 % — ABNORMAL HIGH (ref 12–46)
Lymphs Abs: 3.2 10*3/uL (ref 0.7–4.0)
MCH: 28.5 pg (ref 26.0–34.0)
MCHC: 33 g/dL (ref 30.0–36.0)
MCV: 86.3 fL (ref 78.0–100.0)
MPV: 9.9 fL (ref 9.4–12.4)
Monocytes Absolute: 0.6 10*3/uL (ref 0.1–1.0)
Monocytes Relative: 9 % (ref 3–12)
Neutro Abs: 2.9 10*3/uL (ref 1.7–7.7)
Neutrophils Relative %: 42 % — ABNORMAL LOW (ref 43–77)
Platelets: 137 10*3/uL — ABNORMAL LOW (ref 150–400)
RBC: 5.33 MIL/uL (ref 4.22–5.81)
RDW: 14.7 % (ref 11.5–15.5)
WBC: 6.8 10*3/uL (ref 4.0–10.5)

## 2014-01-16 MED ORDER — OXYCODONE-ACETAMINOPHEN 10-325 MG PO TABS: 1.0000 | ORAL_TABLET | Freq: Three times a day (TID) | ORAL | Status: DC | PRN

## 2014-01-16 MED ORDER — CLOTRIMAZOLE 1 % EX CREA: 1.0000 "application " | TOPICAL_CREAM | Freq: Two times a day (BID) | CUTANEOUS | Status: DC

## 2014-01-16 NOTE — Assessment & Plan Note (Signed)
Pain control at this time, refilled perococet

## 2014-01-16 NOTE — Assessment & Plan Note (Signed)
Chronic fatigue MTF, check CBC and metabolic today, ongoing problem since treatment for skull lesion

## 2014-01-16 NOTE — Assessment & Plan Note (Signed)
possible seborrheic based on location, clotrimazole BID , moisturize

## 2014-01-16 NOTE — Assessment & Plan Note (Signed)
Trial of nasonex ,given samples from office

## 2014-01-16 NOTE — Patient Instructions (Signed)
Apply cream twice a day to ear Use nasal steroid to see if this helps sinuses Pain medication refilled We will call with lab results F/U Dr. Dennard Schaumann as needed

## 2014-01-16 NOTE — Progress Notes (Signed)
Patient ID: Dean [name], male   DOB: [birth year], 78 y.o.   MRN: [id number]   Subjective:    Patient ID: Dean [name], male    DOB: 01-21-1936, 78 y.o.   MRN: [id number]  Patient presents for 3 month F/U and Congestion  Patient here to follow-up. He was briefly diagnosed with compression fracture of the lumbar spine he requests a refill on his pain medication. He's also had runny nose and drainage for the past 3 months. He does have some allergies. He has not tried anything over-the-counter. He also complains on some dry itchy skin on both the ears no drainage from the ears. This been going on for couple weeks. He did try what sounds like a steroid cream of some sort but it did not help very much.  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 His last imaging of his skull as well as his neck did not show any new lesions he does have known arthritis and degenerative changes throughout his entire spine and now he has the compression fractures noted on x-ray.   Review Of Systems:  GEN- + fatigue, fever, weight loss,weakness, recent illness HEENT- denies eye drainage, change in vision, +nasal discharge, CVS- denies chest pain, palpitations RESP- denies SOB, cough, wheeze ABD- denies N/V, change in stools, abd pain GU- denies dysuria, hematuria, dribbling, incontinence MSK- +joint pain, muscle aches, injury Neuro- denies headache, dizziness, syncope, seizure activity       Objective:    BP 126/68 mmHg  Pulse 66  Temp(Src) 97.6 F (36.4 C) (Oral)  Resp 14  Ht 5\' 6"  (1.676 m)  Wt 237 lb (107.502 kg)  BMI 38.27 kg/m2 GEN- NAD, alert and oriented x3 HEENT- PERRL, EOMI, non injected sclera, pink conjunctiva, MMM, oropharynx clear TM clear bilat no effusion, no  maxillary sinus tenderness,+ clear  Nasal drainage , turbinates mild enlargement Skin- bilat pinnamild erythema with flakly skin, behind ear erythema flaking of skin bilat no  pustules or discharge noted, NT Neck- Supple, no LAD CVS- RRR, no murmur RESP-CTAB EXT- No edema Pulses- Radial 2+         Assessment & Plan:      Problem List Items Addressed This Visit      Unprioritized   Lumbar compression fracture   Fatigue   Relevant Orders      CBC with Differential      Comprehensive metabolic panel   Dermatitis   Allergic rhinitis    Other Visit Diagnoses    Low back pain without sciatica, unspecified back pain laterality    -  Primary    Relevant Medications       oxyCODONE-acetaminophen (PERCOCET) 10-325 MG per tablet       Note: This dictation was prepared with Dragon dictation along with smaller phrase technology. Any transcriptional errors that result from this process are unintentional.

## 2014-01-17 ENCOUNTER — Encounter: Payer: Self-pay | Admitting: *Deleted

## 2014-02-06 ENCOUNTER — Encounter: Payer: Self-pay | Admitting: Family Medicine

## 2014-02-06 ENCOUNTER — Ambulatory Visit (INDEPENDENT_AMBULATORY_CARE_PROVIDER_SITE_OTHER): Payer: Medicare Other | Admitting: Family Medicine

## 2014-02-06 ENCOUNTER — Ambulatory Visit
Admission: RE | Admit: 2014-02-06 | Discharge: 2014-02-06 | Disposition: A | Discharge status: Home / Self Care | Payer: Medicare Other | Source: Ambulatory Visit | Attending: Family Medicine | Admitting: Family Medicine

## 2014-02-06 VITALS — BP 120/76 | HR 76 | Temp 97.6°F | Resp 20 | Ht 69.0 in | Wt 238.0 lb

## 2014-02-06 DIAGNOSIS — R091 Pleurisy: Secondary | ICD-10-CM

## 2014-02-06 DIAGNOSIS — R918 Other nonspecific abnormal finding of lung field: Secondary | ICD-10-CM | POA: Diagnosis not present

## 2014-02-06 DIAGNOSIS — R079 Chest pain, unspecified: Secondary | ICD-10-CM

## 2014-02-06 DIAGNOSIS — M898X8 Other specified disorders of bone, other site: Secondary | ICD-10-CM | POA: Diagnosis not present

## 2014-02-06 LAB — CBC WITH DIFFERENTIAL/PLATELET
Basophils Absolute: 0 10*3/uL (ref 0.0–0.1)
Basophils Relative: 0 % (ref 0–1)
Eosinophils Absolute: 0.1 10*3/uL (ref 0.0–0.7)
Eosinophils Relative: 1 % (ref 0–5)
HCT: 42.5 % (ref 39.0–52.0)
Hemoglobin: 14.9 g/dL (ref 13.0–17.0)
Lymphocytes Relative: 42 % (ref 12–46)
Lymphs Abs: 3.1 10*3/uL (ref 0.7–4.0)
MCH: 28.8 pg (ref 26.0–34.0)
MCHC: 35.1 g/dL (ref 30.0–36.0)
MCV: 82 fL (ref 78.0–100.0)
MPV: 9.2 fL (ref 8.6–12.4)
Monocytes Absolute: 0.7 10*3/uL (ref 0.1–1.0)
Monocytes Relative: 9 % (ref 3–12)
Neutro Abs: 3.6 10*3/uL (ref 1.7–7.7)
Neutrophils Relative %: 48 % (ref 43–77)
Platelets: 168 10*3/uL (ref 150–400)
RBC: 5.18 MIL/uL (ref 4.22–5.81)
RDW: 14.8 % (ref 11.5–15.5)
WBC: 7.4 10*3/uL (ref 4.0–10.5)

## 2014-02-06 LAB — COMPLETE METABOLIC PANEL WITH GFR
ALT: 8 U/L (ref 0–53)
AST: 10 U/L (ref 0–37)
Albumin: 3.6 g/dL (ref 3.5–5.2)
Alkaline Phosphatase: 39 U/L (ref 39–117)
BUN: 18 mg/dL (ref 6–23)
CO2: 24 mEq/L (ref 19–32)
Calcium: 9.2 mg/dL (ref 8.4–10.5)
Chloride: 104 mEq/L (ref 96–112)
Creat: 0.76 mg/dL (ref 0.50–1.35)
GFR, Est African American: >89 mL/min
GFR, Est Non African American: 89 mL/min
Glucose, Bld: 96 mg/dL (ref 70–99)
Potassium: 4.6 mEq/L (ref 3.5–5.3)
Sodium: 140 mEq/L (ref 135–145)
Total Bilirubin: 0.4 mg/dL (ref 0.2–1.2)
Total Protein: 6 g/dL (ref 6.0–8.3)

## 2014-02-06 NOTE — Progress Notes (Signed)
Subjective:    Patient ID: Dean [name], male    DOB: 12/24/1935, 79 y.o.   MRN: [id number]  HPI  patient has had right-sided chest wall pain for several months. However since December has become more severe and almost constant. The pain is located in his right chest near his right nipple. It is made worse by coughing, deep inspiration, and movement. He denies any angina. He denies any shortness of breath. He denies any hemoptysis. He denies any fevers or chills or weight loss. He denies any relationship of the pain to food or fatty meals. He does still have his gallbladder. He denies any weight loss or jaundice. He denies any fever or night sweats. I am unable to reproduce the pain today with deep palpation of the right chest and ribs. Past Medical History  Diagnosis Date  . Neuropathy   . BPH (benign prostatic hyperplasia)   . GERD (gastroesophageal reflux disease)   . H/O ETOH abuse   . Skull lesion     Left occipital condyle  . History of radiation therapy 01/05/13-02/10/13    45 gray to left occipital condyle region   Past Surgical History  Procedure Laterality Date  . Cyst excision  1959    tail bone   Current Outpatient Prescriptions on File Prior to Visit  Medication Sig Dispense Refill  . ALPRAZolam (XANAX) 1 MG tablet Take 1 tablet (1 mg total) by mouth 2 (two) times daily as needed. For anxiety and/or sleep 60 tablet 0  . Aspirin-Salicylamide-Caffeine (BC HEADACHE POWDER PO) Take 1 packet by mouth 2 (two) times daily as needed.    . Cholecalciferol (VITAMIN D3) 1000 UNITS CHEW Chew 1 tablet by mouth daily.    . clotrimazole (LOTRIMIN) 1 % cream Apply 1 application topically 2 (two) times daily. To ear 45 g 2  . gabapentin (NEURONTIN) 400 MG capsule Take 1 capsule (400 mg total) by mouth 4 (four) times daily. May take up to five times daily depending on pain in feet 180 capsule 5  . mometasone (NASONEX) 50 MCG/ACT nasal spray Place 2 sprays into the nose daily.    Marland Kitchen  oxyCODONE-acetaminophen (PERCOCET) 10-325 MG per tablet Take 1 tablet by mouth every 8 (eight) hours as needed for pain. 45 tablet 0  . tamsulosin (FLOMAX) 0.4 MG CAPS capsule TAKE ONE CAPSULE EVERY EVENING. 30 capsule 5   No current facility-administered medications on file prior to visit.   Allergies  Allergen Reactions  . Codeine Other (See Comments)    Severe headache  . Morphine And Related Other (See Comments)    Makes him feel weird   History   Social History  . Marital Status: Married    Spouse Name: N/A    Number of Children: 3  . Years of Education: N/A   Occupational History  .     Social History Main Topics  . Smoking status: Never Smoker   . Smokeless tobacco: Never Used  . Alcohol Use: No     Comment: " Not much no more"  . Drug Use: No  . Sexual Activity: Not on file   Other Topics Concern  . Not on file   Social History Narrative      Review of Systems  All other systems reviewed and are negative.      Objective:   Physical Exam  Constitutional: He appears well-developed and well-nourished.  Eyes: Conjunctivae are normal. No scleral icterus.  Neck: Neck supple. No JVD present.  No thyromegaly present.  Cardiovascular: Normal rate, regular rhythm and normal heart sounds.  Exam reveals no gallop and no friction rub.   No murmur heard. Pulmonary/Chest: Effort normal and breath sounds normal. No respiratory distress. He has no wheezes. He has no rales. He exhibits no tenderness.  Abdominal: Soft. Bowel sounds are normal. He exhibits no distension. There is no tenderness. There is no rebound and no guarding.  Musculoskeletal: He exhibits no edema.  Lymphadenopathy:    He has no cervical adenopathy.  Vitals reviewed.         Assessment & Plan:  Pleurisy - Plan: DG Chest 2 View, DG Ribs Unilateral Right, COMPLETE METABOLIC PANEL WITH GFR, CBC with Differential, EKG 12-Lead  Right-sided chest pain   patient's diagnosis is uncertain. I do not  believe that this is cardiac in nature. EKG obtained today in the office reveals normal sinus rhythm with normal intervals and normal axis. However the patient does have mild nonspecific ST changes particularly in the inferior and lateral leads. This being said, the patient's pain is not typical for angina or cardiac pain. It sounds musculoskeletal in the chest wall. Therefore I will begin with a chest x-ray as well as x-rays of the ribs. I'll also check a CBC as well as a CMP. Further plan will be dictated based upon the results of his lab work and chest x-ray.

## 2014-02-07 ENCOUNTER — Telehealth: Payer: Self-pay | Admitting: Family Medicine

## 2014-02-07 DIAGNOSIS — R222 Localized swelling, mass and lump, trunk: Secondary | ICD-10-CM

## 2014-02-07 NOTE — Telephone Encounter (Signed)
Pt aware of lab and Chest Xray results.  Urgent referral to Oncology initiated.  Has been seen at [facility] in past.  Referral nurse notified

## 2014-02-07 NOTE — Telephone Encounter (Signed)
-----  Message from [name], MD sent at 02/07/2014  7:24 AM EST ----- There is a mass eroding/destroying the right 5th rib which is likely due to multiple myeloma.  We need to get him to see his oncologist ASAP.

## 2014-02-07 NOTE — Telephone Encounter (Signed)
-----   Message from [name], MD sent at 02/07/2014  7:22 AM EST ----- Labs are nml.  See cxr results.

## 2014-02-10 ENCOUNTER — Encounter: Payer: Self-pay | Admitting: Radiation Oncology

## 2014-02-10 NOTE — Progress Notes (Addendum)
Histology and Location of Primary Cancer: multiple myeloma  Sites of Visceral and Bony Metastatic Disease: Left occipital condyle infiltrative lesion, multiple compression fractures, new expansile osteolytic mass in the RIGHT upper chest. This destroys the posterior lateral fifth rib found on chest xray from 02/06/14.   Location(s) of Symptomatic Metastases: Expansile destructive lesion of the RIGHT posterior fifth rib.  Past/Anticipated chemotherapy by medical oncology, if any: has an appointment with Dr. Whitney Muse 03/17/14  Pain on a scale of 0-10 is: 3/10 - he takes percocet 10-325 mg 2-3 per day.   If Spine Met(s), symptoms, if any, include:  Bowel/Bladder retention or incontinence (please describe): Patient reports occasional constipation, denies bladder issues.  He takes flomax.  Numbness or weakness in extremities (please describe): no  Current Decadron regimen, if applicable: no  Ambulatory status? Walker? Wheelchair?: ambulatory - has fatigue with acitivty.  SAFETY ISSUES:  Prior radiation? Yes 01/05/13-02/10/13 - 45 gray to left occipital condyle region  Pacemaker/ICD? no  Possible current pregnancy? no  Is the patient on methotrexate? no  Current Complaints / other details:  Patient is here with his wife.  He reports the pain in his right chest started last spring.  It really started to hurt last Monday.  Reports trouble sleeping.

## 2014-02-15 ENCOUNTER — Ambulatory Visit
Admission: RE | Admit: 2014-02-15 | Discharge: 2014-02-15 | Disposition: A | Discharge status: Home / Self Care | Payer: Medicare Other | Source: Ambulatory Visit | Attending: Radiation Oncology | Admitting: Radiation Oncology

## 2014-02-15 ENCOUNTER — Encounter: Payer: Self-pay | Admitting: Radiation Oncology

## 2014-02-15 VITALS — BP 139/92 | HR 81 | Temp 97.8°F | Resp 12 | Ht 69.0 in | Wt 237.2 lb

## 2014-02-15 DIAGNOSIS — C9000 Multiple myeloma not having achieved remission: Secondary | ICD-10-CM | POA: Diagnosis not present

## 2014-02-15 HISTORY — DX: Multiple myeloma not having achieved remission: C90.00

## 2014-02-15 NOTE — Progress Notes (Signed)
  Radiation Oncology         [phone] ________________________________  Name: Quince Santana MRN: [id number]  Date: 02/15/2014  DOB: Aug 27, 1935  SIMULATION AND TREATMENT PLANNING NOTE    ICD-9-CM ICD-10-CM   1. Multiple myeloma without remission 203.00 C90.00     DIAGNOSIS:  Multiple myeloma  NARRATIVE:  The patient was brought to the [place] suite.  Identity was confirmed.  All relevant records and images related to the planned course of therapy were reviewed.  The patient freely provided informed written consent to proceed with treatment after reviewing the details related to the planned course of therapy. The consent form was witnessed and verified by the simulation staff.  Then, the patient was set-up in a stable reproducible  supine position for radiation therapy.  CT images were obtained.  Surface markings were placed.  The CT images were loaded into the planning software.  Then the target and avoidance structures were contoured.  Treatment planning then occurred.  The radiation prescription was entered and confirmed.  Then, I designed and supervised the construction of a total of 0 medically necessary complex treatment devices.  I have requested : 3D Simulation  I have requested a DVH of the following structures: GTV, PTV, lungs.  I have ordered:dose calc.  PLAN:  The patient will receive 30 Gy in 10 fractions.  ________________________________  -----------------------------------  Blair Promise, PhD, MD

## 2014-02-15 NOTE — Progress Notes (Signed)
Radiation Oncology         [phone] ________________________________  Name: Dean [name] MRN: [id number]  Date: 02/15/2014  DOB: 20-Oct-1935  Reevaluation Note  CC: [name], MD  [name], MD    ICD-9-CM ICD-10-CM   1. Multiple myeloma without remission 203.00 C90.00 Ambulatory referral to Social Work    Diagnosis:   Multiple myeloma  Interval Since Last Radiation:  One year, the patient completed treatments to the left occipital condyle region, 45 gray in 25 fractions  Narrative:  Patient is seen today at the courtesy of Dr. Dennard Schaumann for evaluation and consideration for additional radiation therapy as part of management of the patient's multiple myeloma. Patient presented over a year ago with severe pain in the left occipital area. He proceeded with radiation therapy with good result. 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  Over the past several weeks patient's had increasing pain along the chest area. The patient was seen by Dr. Dennard Schaumann  chest x-ray and right rib films were obtained which showed a soft tissue mass along the right posterior fifth rib with some rib destruction. While these findings the patient is now seen in radiation oncology for consideration for additional treatment.   The patient notices pain along the right lateral and posterior chest. This is worsened with coughing. He denies any worsening with deep inspiration. Patient denies any significant cough or hemoptysis.                           ALLERGIES:  is allergic to codeine and morphine and related.  Meds: Current Outpatient Prescriptions  Medication Sig Dispense Refill  . ALPRAZolam (XANAX) 1 MG tablet Take 1 tablet (1 mg total) by mouth 2 (two) times daily as needed. For anxiety and/or sleep 60 tablet 0  . Aspirin-Salicylamide-Caffeine (BC HEADACHE POWDER PO) Take 1 packet by mouth 2 (two) times daily as needed.    . clotrimazole (LOTRIMIN) 1  % cream Apply 1 application topically 2 (two) times daily. To ear 45 g 2  . gabapentin (NEURONTIN) 400 MG capsule Take 1 capsule (400 mg total) by mouth 4 (four) times daily. May take up to five times daily depending on pain in feet 180 capsule 5  . mometasone (NASONEX) 50 MCG/ACT nasal spray Place 2 sprays into the nose daily.    Marland Kitchen oxyCODONE-acetaminophen (PERCOCET) 10-325 MG per tablet Take 1 tablet by mouth every 8 (eight) hours as needed for pain. 45 tablet 0  . tamsulosin (FLOMAX) 0.4 MG CAPS capsule TAKE ONE CAPSULE EVERY EVENING. 30 capsule 5  . Cholecalciferol (VITAMIN D3) 1000 UNITS CHEW Chew 1 tablet by mouth daily.     No current facility-administered medications for this encounter.    Physical Findings: The patient is in no acute distress. Patient is alert and oriented.  height is 5' 9"  (1.753 m) and weight is 237 lb 3.2 oz (107.593 kg). His oral temperature is 97.8 F (36.6 C). His blood pressure is 139/92 and his pulse is 81. His respiration is 12 and oxygen saturation is 96%. .  No palpable subclavicular or axillary adenopathy. The lungs are clear to auscultation. The heart has a regular rhythm and rate the abdomen is soft and nontender with normal bowel sounds. On neurological examination motor strength is 5 out of 5 in the proximal distal muscle groups of the upper lower extremities.  Lab Findings: Lab Results  Component Value Date   WBC 7.4 02/06/2014   HGB 14.9 02/06/2014   HCT 42.5 02/06/2014   MCV 82.0 02/06/2014   PLT 168 02/06/2014    Radiographic Findings: Dg Chest 2 View  02/06/2014   CLINICAL DATA:  RIGHT-sided chest pain for several weeks.  EXAM: CHEST  2 VIEW  COMPARISON:  Chest CT 12/31/2012.  FINDINGS: There is a new expansile osteolytic mass in the RIGHT upper chest. This destroys the posterior lateral fifth rib. The mass measures 26 mm x 62 mm. 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 The cardiopericardial silhouette is  within normal limits. There is no pneumothorax. No pleural effusion.  IMPRESSION: New RIGHT upper chest mass with RIGHT fifth rib destruction suspicious for multiple myeloma in a patient with monoclonal gammopathy.   Electronically Signed   By: Dereck Ligas M.D.   On: 02/06/2014 14:26   Dg Ribs Unilateral Right  02/06/2014   CLINICAL DATA:  RIGHT-sided chest pain for several weeks.  EXAM: RIGHT RIBS - 2 VIEW  COMPARISON:  Chest radiograph today.  FINDINGS: Expansile osteolytic lesion is again identified, this time overlying the scapula. The posterior fifth rib is destroyed, with expansion and soft tissue density better seen on the chest radiograph.  IMPRESSION: Expansile destructive lesion of the RIGHT posterior fifth rib.   Electronically Signed   By: Dereck Ligas M.D.   On: 02/06/2014 14:29    Impression:  Progressive multiple myeloma. The patient has a significant lesion along the right posterior chest wall with fifth rib destruction. He would be a good candidate for palliative radiation therapy directed at this area. I discussed the treatment course side effects and potential toxicities of radiation therapy in this situation with the patient. He appears to understand and wishes to proceed with planned course of treatment.  Plan:  Simulation and planning today with treatments to begin next week. Anticipate between 10 and 15 radiation treatments directed at the area of concern. In the next several weeks patient will be seen by medical oncology for further evaluation concerning his multiple myeloma.  ____________________________________ Blair Promise, MD

## 2014-02-15 NOTE — Progress Notes (Signed)
Please see the Nurse Progress Note in the MD Initial Consult Encounter for this patient. 

## 2014-02-16 ENCOUNTER — Encounter (HOSPITAL_BASED_OUTPATIENT_CLINIC_OR_DEPARTMENT_OTHER): Payer: Medicare Other

## 2014-02-16 ENCOUNTER — Encounter (HOSPITAL_COMMUNITY): Payer: Medicare Other | Attending: Hematology and Oncology | Admitting: Hematology & Oncology

## 2014-02-16 VITALS — BP 146/65 | HR 69 | Temp 97.9°F | Resp 16 | Wt 239.0 lb

## 2014-02-16 DIAGNOSIS — R630 Anorexia: Secondary | ICD-10-CM | POA: Diagnosis not present

## 2014-02-16 DIAGNOSIS — M542 Cervicalgia: Secondary | ICD-10-CM | POA: Insufficient documentation

## 2014-02-16 DIAGNOSIS — R634 Abnormal weight loss: Secondary | ICD-10-CM | POA: Diagnosis not present

## 2014-02-16 DIAGNOSIS — N400 Enlarged prostate without lower urinary tract symptoms: Secondary | ICD-10-CM | POA: Diagnosis not present

## 2014-02-16 DIAGNOSIS — C9000 Multiple myeloma not having achieved remission: Secondary | ICD-10-CM | POA: Diagnosis not present

## 2014-02-16 DIAGNOSIS — D472 Monoclonal gammopathy: Secondary | ICD-10-CM | POA: Diagnosis not present

## 2014-02-16 DIAGNOSIS — Z923 Personal history of irradiation: Secondary | ICD-10-CM | POA: Insufficient documentation

## 2014-02-16 DIAGNOSIS — K219 Gastro-esophageal reflux disease without esophagitis: Secondary | ICD-10-CM | POA: Diagnosis not present

## 2014-02-16 DIAGNOSIS — M899 Disorder of bone, unspecified: Secondary | ICD-10-CM | POA: Insufficient documentation

## 2014-02-16 LAB — COMPREHENSIVE METABOLIC PANEL
ALT: 10 U/L (ref 0–53)
AST: 14 U/L (ref 0–37)
Albumin: 3.6 g/dL (ref 3.5–5.2)
Alkaline Phosphatase: 37 U/L — ABNORMAL LOW (ref 39–117)
Anion gap: 4 — ABNORMAL LOW (ref 5–15)
BUN: 20 mg/dL (ref 6–23)
CO2: 28 mmol/L (ref 19–32)
Calcium: 8.7 mg/dL (ref 8.4–10.5)
Chloride: 108 mEq/L (ref 96–112)
Creatinine, Ser: 0.75 mg/dL (ref 0.50–1.35)
GFR calc Af Amer: >90 mL/min (ref >90)
GFR calc non Af Amer: 87 mL/min — ABNORMAL LOW (ref >90)
Glucose, Bld: 93 mg/dL (ref 70–99)
Potassium: 4.3 mmol/L (ref 3.5–5.1)
Sodium: 140 mmol/L (ref 135–145)
Total Bilirubin: 0.6 mg/dL (ref 0.3–1.2)
Total Protein: 6.6 g/dL (ref 6.0–8.3)

## 2014-02-16 LAB — CBC WITH DIFFERENTIAL/PLATELET
Basophils Absolute: 0 10*3/uL (ref 0.0–0.1)
Basophils Relative: 0 % (ref 0–1)
Eosinophils Absolute: 0.1 10*3/uL (ref 0.0–0.7)
Eosinophils Relative: 1 % (ref 0–5)
HCT: 43.9 % (ref 39.0–52.0)
Hemoglobin: 14.3 g/dL (ref 13.0–17.0)
Lymphocytes Relative: 43 % (ref 12–46)
Lymphs Abs: 3.5 10*3/uL (ref 0.7–4.0)
MCH: 28.7 pg (ref 26.0–34.0)
MCHC: 32.6 g/dL (ref 30.0–36.0)
MCV: 88.2 fL (ref 78.0–100.0)
Monocytes Absolute: 0.8 10*3/uL (ref 0.1–1.0)
Monocytes Relative: 10 % (ref 3–12)
Neutro Abs: 3.7 10*3/uL (ref 1.7–7.7)
Neutrophils Relative %: 46 % (ref 43–77)
Platelets: 165 10*3/uL (ref 150–400)
RBC: 4.98 MIL/uL (ref 4.22–5.81)
RDW: 14.1 % (ref 11.5–15.5)
WBC: 8.1 10*3/uL (ref 4.0–10.5)

## 2014-02-16 NOTE — Patient Instructions (Signed)
[place] at [facility]  Discharge Instructions:  I have discussed myeloma with you. We are going to arrange for a bone marrow biopsy to help officially diagnose your myeloma I will see you back after your biopsy _______________________________________________________________  Thank you for choosing [place] at [facility] to provide your oncology and hematology care.  To afford each patient quality time with our providers, please arrive at least 15 minutes before your scheduled appointment.  You need to re-schedule your appointment if you arrive 10 or more minutes late.  We strive to give you quality time with our providers, and arriving late affects you and other patients whose appointments are after yours.  Also, if you no show three or more times for appointments you may be dismissed from the clinic.  Again, thank you for choosing [place] [facility] [place] [facility] hope is that these requests will allow you access to exceptional care and in a timely manner. _______________________________________________________________  If you have questions after your visit, please contact our office at [phone] between the hours of 8:30 a.m. and 5:00 p.m. Voicemails left after 4:30 p.m. will not be returned until the following business day. _______________________________________________________________  For prescription refill requests, have your pharmacy contact our office. _______________________________________________________________  Recommendations made by the consultant and any test results will be sent to your referring physician. _______________________________________________________________

## 2014-02-16 NOTE — Progress Notes (Signed)
[name], MD [address] [place] [id number]  Left occipital condyle infiltrative lesion, not amenable to biopsy. Palliative XRT without a tissue diagnosis  Chest x-ray and right rib films were obtained which showed a soft tissue mass along the right posterior fifth rib with some rib destruction.  Monoclonal IgG kappa, normal IgG levels, suppressed IgM, abnormal kappa/lamda ratio   Ref Range 38moago    Kappa free light chain 0.33 - 1.94 mg/dL 4.42 (H)   Lamda free light chains 0.57 - 2.63 mg/dL 1.69   Kappa, lamda light chain ratio 0.26 - 1.65  2.62 (H)    NO BMBX to date  Myeloma survey on 12/01/2012 with L3 superior endplate compression FX, no lytic lesions to suggest Myeloma  CURRENT THERAPY: XRT   INTERVAL HISTORY: JCleave [id number]. male returns for follow-up of probable myeloma.he presented initially with a left occipital condyle infiltrative lesion that was not amenable to biopsy and underwent palliative radiation. He is now presented with back and rib pain with x-ray showing a soft tissue mass in the posterior right fifth rib. He is getting ready to start radiation again. He has an abnormal Kappa, Lambda light chain ratio and suppressed IgM. He has never had a bone marrow biopsy as part of his evaluation.  His only complaint today is pain. He is also concerned that he will continue to develop this problem again and again. He agreed to come in today for additional discussion and workup for potential myeloma and to consider therapy.  MEDICAL HISTORY: Past Medical History  Diagnosis Date  . Neuropathy   . BPH (benign prostatic hyperplasia)   . GERD (gastroesophageal reflux disease)   . H/O ETOH abuse   . Skull lesion     Left occipital condyle  . History of radiation therapy 01/05/13-02/10/13    45 gray to left occipital condyle region  . Multiple myeloma     has Neuropathy; BPH (benign prostatic hyperplasia); GERD (gastroesophageal  reflux disease); H/O ETOH abuse; Skull lesion; Neck pain; Fatigue; Loss of weight; Insomnia; MGUS (monoclonal gammopathy of unknown significance); Allergic rhinitis; Lumbar compression fracture; Dermatitis; and Multiple myeloma without remission on his problem list.     No history exists.     is allergic to codeine and morphine and related.  Mr. HSanfilippodoes not currently have medications on file.  SURGICAL HISTORY: Past Surgical History  Procedure Laterality Date  . Cyst excision  1959    tail bone    SOCIAL HISTORY: History   Social History  . Marital Status: Married    Spouse Name: N/A    Number of Children: 3  . Years of Education: N/A   Occupational History  .     Social History Main Topics  . Smoking status: Never Smoker   . Smokeless tobacco: Never Used  . Alcohol Use: No     Comment: " Not much no more"  . Drug Use: No  . Sexual Activity: Not on file   Other Topics Concern  . Not on file   Social History Narrative    FAMILY HISTORY: Family History  Problem Relation Age of Onset  . Diabetes Mother   . Stroke Mother   . Hypertension Father     Review of Systems  Constitutional: Negative for fever, chills, weight loss and malaise/fatigue.  HENT: Negative for congestion, hearing loss, nosebleeds, sore throat and tinnitus.   Eyes: Negative for blurred vision, double  vision, pain and discharge.  Respiratory: Negative for cough, hemoptysis, sputum production, shortness of breath and wheezing.   Cardiovascular: Negative for chest pain, palpitations, claudication, leg swelling and PND.  Gastrointestinal: Negative for heartburn, nausea, vomiting, abdominal pain, diarrhea, constipation, blood in stool and melena.  Genitourinary: Negative for dysuria, urgency, frequency and hematuria.  Musculoskeletal: Positive for back pain and neck pain. Negative for myalgias, joint pain and falls.  Skin: Negative for itching and rash.  Neurological: Negative for dizziness,  tingling, tremors, sensory change, speech change, focal weakness, seizures, loss of consciousness, weakness and headaches.  Endo/Heme/Allergies: Does not bruise/bleed easily.  Psychiatric/Behavioral: Negative for depression, suicidal ideas, memory loss and substance abuse. The patient is not nervous/anxious and does not have insomnia.     PHYSICAL EXAMINATION  ECOG PERFORMANCE STATUS: 1 - Symptomatic but completely ambulatory  Filed Vitals:   02/16/14 1300  BP: 146/65  Pulse: 69  Temp: 97.9 F (36.6 C)  Resp: 16    Physical Exam  Constitutional: He is oriented to person, place, and time and well-developed, well-nourished, and in no distress.  HENT:  Head: Normocephalic and atraumatic.  Nose: Nose normal.  Mouth/Throat: Oropharynx is clear and moist. No oropharyngeal exudate.  Eyes: Conjunctivae and EOM are normal. Pupils are equal, round, and reactive to light. Right eye exhibits no discharge. Left eye exhibits no discharge. No scleral icterus.  Neck: Normal range of motion. Neck supple. No tracheal deviation present. No thyromegaly present.  Cardiovascular: Normal rate, regular rhythm and normal heart sounds.  Exam reveals no gallop and no friction rub.   No murmur heard. Pulmonary/Chest: Effort normal and breath sounds normal. He has no wheezes. He has no rales.  Abdominal: Soft. Bowel sounds are normal. He exhibits no distension and no mass. There is no tenderness. There is no rebound and no guarding.  Musculoskeletal: Normal range of motion. He exhibits no edema.  Lymphadenopathy:    He has no cervical adenopathy.  Neurological: He is alert and oriented to person, place, and time. He has normal reflexes. No cranial nerve deficit. Gait normal. Coordination normal.  Skin: Skin is warm and dry. No rash noted.  Psychiatric: Mood, memory, affect and judgment normal.  Nursing note and vitals reviewed.   LABORATORY DATA:  CBC    Component Value Date/Time   WBC 8.1 02/16/2014  1315   RBC 4.98 02/16/2014 1315   HGB 14.3 02/16/2014 1315   HCT 43.9 02/16/2014 1315   PLT 165 02/16/2014 1315   MCV 88.2 02/16/2014 1315   MCH 28.7 02/16/2014 1315   MCHC 32.6 02/16/2014 1315   RDW 14.1 02/16/2014 1315   LYMPHSABS 3.5 02/16/2014 1315   MONOABS 0.8 02/16/2014 1315   EOSABS 0.1 02/16/2014 1315   BASOSABS 0.0 02/16/2014 1315   CMP     Component Value Date/Time   NA 140 02/16/2014 1315   K 4.3 02/16/2014 1315   CL 108 02/16/2014 1315   CO2 28 02/16/2014 1315   GLUCOSE 93 02/16/2014 1315   BUN 20 02/16/2014 1315   CREATININE 0.75 02/16/2014 1315   CREATININE 0.76 02/06/2014 1249   CALCIUM 8.7 02/16/2014 1315   PROT 6.6 02/16/2014 1315   ALBUMIN 3.6 02/16/2014 1315   AST 14 02/16/2014 1315   ALT 10 02/16/2014 1315   ALKPHOS 37* 02/16/2014 1315   BILITOT 0.6 02/16/2014 1315   GFRNONAA 87* 02/16/2014 1315   GFRNONAA 89 02/06/2014 1249   GFRAA >90 02/16/2014 1315   GFRAA >89 02/06/2014 1249  RADIOGRAPHIC STUDIES:  EXAM: CHEST 2 VIEW  COMPARISON: Chest CT 12/31/2012.  FINDINGS: There is a new expansile osteolytic mass in the RIGHT upper chest. This destroys the posterior lateral fifth rib. The mass measures 26 mm x 62 mm. 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 The cardiopericardial silhouette is within normal limits. There is no pneumothorax. No pleural effusion.  IMPRESSION: New RIGHT upper chest mass with RIGHT fifth rib destruction suspicious for multiple myeloma in a patient with monoclonal gammopathy.   Electronically Signed  By: Dereck Ligas M.D.  On: 02/06/2014 14:26   CLINICAL DATA: Occipital condyle mass. Evaluate for multiple myeloma lesions.  EXAM: METASTATIC BONE SURVEY  COMPARISON: CT chest abdomen pelvis 11/30/2012.  FINDINGS: AP and lateral cervical spine views are included in a dedicated exam performed the same day. Frontal view of the chest shows  midline trachea and normal heart size. Lungs are clear. No pleural fluid.  No definite lucent lesions. The frontal view of the lumbar spine is degraded by motion. Mild superior endplate compression involving the L3 vertebral body, as on yesterday's exam. Multilevel spondylosis.  IMPRESSION: 1. No lytic lesions to indicate multiple myeloma. 2. AP and lateral views of the cervical spine are included in the dedicated cervical spine series performed the same day. 3. L3 superior endplate compression fracture, as on 11/30/2012.   Electronically Signed  By: Lorin Picket M.D.  On: 12/01/2012 15:34     ASSESSMENT and THERAPY PLAN:    Multiple myeloma without remission Pleasant 79 year old male with L occipital condyle infiltrative lesion in late 2014.  He underwent palliative XRT without biopsy as then lesion was not amenable to biopsy.  He now presents with a soft tissue mass along the right posterior fifth rib.  He is scheduled to start XRT.  He has not undergone biopsy of the lesion.  To date, given clinical findings and peripheral labs he has myeloma.  I addressed completing a formal diagnostic workup including a BMBX. BMBX is necessary for diagnosis, and will provide [place] with cytogenetics to steer therapy.  I discussed the risks and benefits of a marrow. I discussed treatment of Myeloma and advised him that most therapy is very well tolerated. Myeloma is a very treatable disease and if appropriately managed can prevent the current complications he is having.  He and his wife were provided with reading information from the [facility] about myeloma.  I have instructed them to bring their questions to their next follow-up.  A myeloma panel, CBC, CMP were obtained, I will review this with the patient at follow-up as well.     All questions were answered. The patient knows to call the clinic with any problems, questions or concerns. We can certainly see the patient  much sooner if necessary. Molli Hazard 02/20/2014

## 2014-02-16 NOTE — Assessment & Plan Note (Addendum)
Pleasant 79 year old male with L occipital condyle infiltrative lesion in late 2014.  He underwent palliative XRT without biopsy as then lesion was not amenable to biopsy.  He now presents with a soft tissue mass along the right posterior fifth rib.  He is scheduled to start XRT.  He has not undergone biopsy of the lesion.  To date, given clinical findings and peripheral labs he has myeloma.  I addressed completing a formal diagnostic workup including a BMBX. BMBX is necessary for diagnosis, and will provide [place] with cytogenetics to steer therapy.  I discussed the risks and benefits of a marrow. I discussed treatment of Myeloma and advised him that most therapy is very well tolerated. Myeloma is a very treatable disease and if appropriately managed can prevent the current complications he is having.  He and his wife were provided with reading information from the [facility] about myeloma.  I have instructed them to bring their questions to their next follow-up.  A myeloma panel, CBC, CMP were obtained, I will review this with the patient at follow-up as well.

## 2014-02-17 ENCOUNTER — Other Ambulatory Visit: Payer: Self-pay | Admitting: Radiology

## 2014-02-17 NOTE — Progress Notes (Signed)
Labs for mm,cmp,cbcd

## 2014-02-20 ENCOUNTER — Ambulatory Visit (HOSPITAL_COMMUNITY)
Admission: RE | Admit: 2014-02-20 | Discharge: 2014-02-20 | Disposition: A | Discharge status: Home / Self Care | Payer: Medicare Other | Source: Ambulatory Visit | Attending: Hematology & Oncology | Admitting: Hematology & Oncology

## 2014-02-20 ENCOUNTER — Encounter (HOSPITAL_COMMUNITY): Payer: Self-pay

## 2014-02-20 ENCOUNTER — Encounter (HOSPITAL_COMMUNITY): Payer: Self-pay | Admitting: Hematology & Oncology

## 2014-02-20 DIAGNOSIS — Z79899 Other long term (current) drug therapy: Secondary | ICD-10-CM | POA: Insufficient documentation

## 2014-02-20 DIAGNOSIS — N400 Enlarged prostate without lower urinary tract symptoms: Secondary | ICD-10-CM | POA: Diagnosis not present

## 2014-02-20 DIAGNOSIS — G629 Polyneuropathy, unspecified: Secondary | ICD-10-CM | POA: Diagnosis not present

## 2014-02-20 DIAGNOSIS — Z885 Allergy status to narcotic agent status: Secondary | ICD-10-CM | POA: Insufficient documentation

## 2014-02-20 DIAGNOSIS — R079 Chest pain, unspecified: Secondary | ICD-10-CM | POA: Insufficient documentation

## 2014-02-20 DIAGNOSIS — D4989 Neoplasm of unspecified behavior of other specified sites: Secondary | ICD-10-CM | POA: Diagnosis not present

## 2014-02-20 DIAGNOSIS — K219 Gastro-esophageal reflux disease without esophagitis: Secondary | ICD-10-CM | POA: Diagnosis not present

## 2014-02-20 DIAGNOSIS — M898X8 Other specified disorders of bone, other site: Secondary | ICD-10-CM | POA: Diagnosis not present

## 2014-02-20 DIAGNOSIS — C9000 Multiple myeloma not having achieved remission: Secondary | ICD-10-CM

## 2014-02-20 DIAGNOSIS — Z923 Personal history of irradiation: Secondary | ICD-10-CM | POA: Insufficient documentation

## 2014-02-20 LAB — PROTIME-INR
INR: 1.06 (ref 0.00–1.49)
Prothrombin Time: 13.9 seconds (ref 11.6–15.2)

## 2014-02-20 LAB — MULTIPLE MYELOMA PANEL, SERUM
Albumin ELP: 55.5 % — ABNORMAL LOW (ref 55.8–66.1)
Alpha-1-Globulin: 5 % — ABNORMAL HIGH (ref 2.9–4.9)
Alpha-2-Globulin: 12.8 % — ABNORMAL HIGH (ref 7.1–11.8)
Beta 2: 4.1 % (ref 3.2–6.5)
Beta Globulin: 5.6 % (ref 4.7–7.2)
Gamma Globulin: 17 % (ref 11.1–18.8)
IgA: 133 mg/dL (ref 68–379)
IgG (Immunoglobin G), Serum: 1180 mg/dL (ref 650–1600)
IgM, Serum: 22 mg/dL — ABNORMAL LOW (ref 41–251)
M-Spike, %: 0.79 g/dL
Total Protein: 6.4 g/dL (ref 6.0–8.3)

## 2014-02-20 LAB — CBC
HCT: 45.2 % (ref 39.0–52.0)
Hemoglobin: 15.1 g/dL (ref 13.0–17.0)
MCH: 29.2 pg (ref 26.0–34.0)
MCHC: 33.4 g/dL (ref 30.0–36.0)
MCV: 87.4 fL (ref 78.0–100.0)
Platelets: 154 10*3/uL (ref 150–400)
RBC: 5.17 MIL/uL (ref 4.22–5.81)
RDW: 13.9 % (ref 11.5–15.5)
WBC: 8 10*3/uL (ref 4.0–10.5)

## 2014-02-20 LAB — BONE MARROW EXAM

## 2014-02-20 LAB — APTT: aPTT: 29 seconds (ref 24–37)

## 2014-02-20 MED ORDER — FENTANYL CITRATE 0.05 MG/ML IJ SOLN
INTRAMUSCULAR | Status: AC | PRN
  Administered 2014-02-20: 50 ug via INTRAVENOUS
  Administered 2014-02-20: 25 ug via INTRAVENOUS

## 2014-02-20 MED ORDER — SODIUM CHLORIDE 0.9 % IV SOLN
INTRAVENOUS | Status: DC
  Administered 2014-02-20: 08:00:00 via INTRAVENOUS

## 2014-02-20 MED ORDER — MIDAZOLAM HCL 2 MG/2ML IJ SOLN
INTRAMUSCULAR | Status: AC
  Filled 2014-02-20: qty 6

## 2014-02-20 MED ORDER — FENTANYL CITRATE 0.05 MG/ML IJ SOLN
INTRAMUSCULAR | Status: AC
  Filled 2014-02-20: qty 4

## 2014-02-20 MED ORDER — MIDAZOLAM HCL 2 MG/2ML IJ SOLN
INTRAMUSCULAR | Status: AC | PRN
  Administered 2014-02-20: 0.5 mg via INTRAVENOUS
  Administered 2014-02-20: 1 mg via INTRAVENOUS

## 2014-02-20 NOTE — Discharge Instructions (Signed)
Bone Marrow Aspiration, Bone Marrow Biopsy °Care After °Read the instructions outlined below and refer to this sheet in the next few weeks. These discharge instructions provide you with general information on caring for yourself after you leave the hospital. Your caregiver may also give you specific instructions. While your treatment has been planned according to the most current medical practices available, unavoidable complications occasionally occur. If you have any problems or questions after discharge, call your caregiver. °FINDING OUT THE RESULTS OF YOUR TEST °Not all test results are available during your visit. If your test results are not back during the visit, make an appointment with your caregiver to find out the results. Do not assume everything is normal if you have not heard from your caregiver or the medical facility. It is important for you to follow up on all of your test results.  °HOME CARE INSTRUCTIONS  °You have had sedation and may be sleepy or dizzy. Your thinking may not be as clear as usual. For the next 24 hours: °· Only take over-the-counter or prescription medicines for pain, discomfort, and or fever as directed by your caregiver. °· Do not drink alcohol. °· Do not smoke. °· Do not drive. °· Do not make important legal decisions. °· Do not operate heavy machinery. °· Do not care for small children by yourself. °· Keep your dressing clean and dry. You may replace dressing with a bandage after 24 hours. °· You may take a bath or shower after 24 hours. °· Use an ice pack for 20 minutes every 2 hours while awake for pain as needed. °SEEK MEDICAL CARE IF:  °· There is redness, swelling, or increasing pain at the biopsy site. °· There is pus coming from the biopsy site. °· There is drainage from a biopsy site lasting longer than one day. °· An unexplained oral temperature above 102° F (38.9° C) develops. °SEEK IMMEDIATE MEDICAL CARE IF:  °· You develop a rash. °· You have difficulty  breathing. °· You develop any reaction or side effects to medications given. °Document Released: 08/09/2004 Document Revised: 04/14/2011 Document Reviewed: 01/18/2008 °ExitCare® Patient Information ©2015 [employer]. This information is not intended to replace advice given to you by your health care provider. Make sure you discuss any questions you have with your health care provider. °Conscious Sedation, Adult, Care After °Refer to this sheet in the next few weeks. These instructions provide you with information on caring for yourself after your procedure. Your health care provider may also give you more specific instructions. Your treatment has been planned according to current medical practices, but problems sometimes occur. Call your health care provider if you have any problems or questions after your procedure. °WHAT TO EXPECT AFTER THE PROCEDURE  °After your procedure: °· You may feel sleepy, clumsy, and have poor balance for several hours. °· Vomiting may occur if you eat too soon after the procedure. °HOME CARE INSTRUCTIONS °· Do not participate in any activities where you could become injured for at least 24 hours. Do not: °¨ Drive. °¨ Swim. °¨ Ride a bicycle. °¨ Operate heavy machinery. °¨ Cook. °¨ Use power tools. °¨ Climb ladders. °¨ Work from a high place. °· Do not make important decisions or sign legal documents until you are improved. °· If you vomit, drink water, juice, or soup when you can drink without vomiting. Make sure you have little or no nausea before eating solid foods. °· Only take over-the-counter or prescription medicines for pain, discomfort, or fever   as directed by your health care provider.  Make sure you and your family fully understand everything about the medicines given to you, including what side effects may occur.  You should not drink alcohol, take sleeping pills, or take medicines that cause drowsiness for at least 24 hours.  If you smoke, do not smoke without  supervision.  If you are feeling better, you may resume normal activities 24 hours after you were sedated.  Keep all appointments with your health care provider. SEEK MEDICAL CARE IF:  Your skin is pale or bluish in color.  You continue to feel nauseous or vomit.  Your pain is getting worse and is not helped by medicine.  You have bleeding or swelling.  You are still sleepy or feeling clumsy after 24 hours. SEEK IMMEDIATE MEDICAL CARE IF:  You develop a rash.  You have difficulty breathing.  You develop any type of allergic problem.  You have a fever. MAKE SURE YOU:  Understand these instructions.  Will watch your condition.  Will get help right away if you are not doing well or get worse. Document Released: 11/10/2012 Document Reviewed: 11/10/2012 [facility] Patient Information 2015 [place], [place]. This information is not intended to replace advice given to you by your health care provider. Make sure you discuss any questions you have with your health care provider.

## 2014-02-20 NOTE — Procedures (Signed)
Procedure:  CT guided bone marrow biopsy Findings:  Aspirate and core biopsy of right iliac bone marrow. No complications. 

## 2014-02-20 NOTE — H&P (Signed)
  Chief Complaint: Multiple myeloma  Referring Physician(s): Penland,Shannon Kristen  History of Present Illness: Dean G Pacey [name] is a 79 y.o. male with history of a left occipital condyle lesion undergoing radiation therapy for probable multiple myeloma. Patient now presents with a soft tissue mass along the right posterior fifth rib some rib destruction. Patient has been seen and scheduled today for an image guided bone marrow biopsy. The patient has had a H&P performed within the last 30 days, all history, medications, and exam have been reviewed. The patient denies any interval changes since the H&P. He denies any known complications to moderate sedation.   Past Medical History  Diagnosis Date  . Neuropathy   . BPH (benign prostatic hyperplasia)   . GERD (gastroesophageal reflux disease)   . H/O ETOH abuse   . Skull lesion     Left occipital condyle  . History of radiation therapy 01/05/13-02/10/13    45 gray to left occipital condyle region  . Multiple myeloma     Past Surgical History  Procedure Laterality Date  . Cyst excision  1959    tail bone    Allergies: Codeine and Morphine and related  Medications: Prior to Admission medications   Medication Sig Start Date End Date Taking? Authorizing Provider  ALPRAZolam (XANAX) 1 MG tablet Take 1 tablet (1 mg total) by mouth 2 (two) times daily as needed. For anxiety and/or sleep Patient taking differently: Take 1 mg by mouth at bedtime as needed for anxiety or sleep.  01/05/14  Yes James D Kinard, MD  Aspirin-Salicylamide-Caffeine (BC HEADACHE POWDER PO) Take 1 packet by mouth 2 (two) times daily as needed.   Yes Historical Provider, MD  clotrimazole (LOTRIMIN) 1 % cream Apply 1 application topically 2 (two) times daily. To ear 01/16/14  Yes Kawanta F [place], MD  diphenhydrAMINE (BENADRYL) 25 MG tablet Take 25 mg by mouth at bedtime as needed for sleep.   Yes Historical Provider, MD  gabapentin (NEURONTIN) 400 MG capsule  Take 1 capsule (400 mg total) by mouth 4 (four) times daily. May take up to five times daily depending on pain in feet Patient taking differently: Take 400 mg by mouth 2 (two) times daily as needed (for neuropathy pain). May take up to five times daily depending on pain in feet 01/05/14  Yes James D Kinard, MD  oxyCODONE-acetaminophen (PERCOCET) 10-325 MG per tablet Take 1 tablet by mouth every 8 (eight) hours as needed for pain. 01/16/14  Yes Kawanta F [place], MD  tamsulosin (FLOMAX) 0.4 MG CAPS capsule TAKE ONE CAPSULE EVERY EVENING. 11/22/13  Yes Warren T Pickard, MD    Family History  Problem Relation Age of Onset  . Diabetes Mother   . Stroke Mother   . Hypertension Father     History   Social History  . Marital Status: Married    Spouse Name: N/A    Number of Children: 3  . Years of Education: N/A   Occupational History  .     Social History Main Topics  . Smoking status: Never Smoker   . Smokeless tobacco: Never Used  . Alcohol Use: No     Comment: " Not much no more"  . Drug Use: No  . Sexual Activity: None   Other Topics Concern  . None   Social History Narrative    Review of Systems: A 12 point ROS discussed and pertinent positives are indicated in the HPI above.  All other systems are negative.    Review of Systems  Vital Signs: BP 141/95 mmHg  Pulse 72  Temp(Src) 97.6 F (36.4 C) (Oral)  Resp 16  SpO2 95%  Physical Exam  Constitutional: He is oriented to person, place, and time. No distress.  HENT:  Head: Normocephalic and atraumatic.  Neck: No tracheal deviation present.  Cardiovascular: Normal rate and regular rhythm.  Exam reveals no gallop and no friction rub.   No murmur heard. Pulmonary/Chest: Effort normal and breath sounds normal. No respiratory distress. He has no wheezes. He has no rales.  Abdominal: Soft. Bowel sounds are normal.  Neurological: He is alert and oriented to person, place, and time.  Skin: He is not diaphoretic.     Imaging: Dg Chest 2 View  02/06/2014   CLINICAL DATA:  RIGHT-sided chest pain for several weeks.  EXAM: CHEST  2 VIEW  COMPARISON:  Chest CT 12/31/2012.  FINDINGS: There is a new expansile osteolytic mass in the RIGHT upper chest. This destroys the posterior lateral fifth rib. The mass measures 26 mm x 62 mm. 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 The cardiopericardial silhouette is within normal limits. There is no pneumothorax. No pleural effusion.  IMPRESSION: New RIGHT upper chest mass with RIGHT fifth rib destruction suspicious for multiple myeloma in a patient with monoclonal gammopathy.   Electronically Signed   By: Geoffrey  Lamke M.D.   On: 02/06/2014 14:26   Dg Ribs Unilateral Right  02/06/2014   CLINICAL DATA:  RIGHT-sided chest pain for several weeks.  EXAM: RIGHT RIBS - 2 VIEW  COMPARISON:  Chest radiograph today.  FINDINGS: Expansile osteolytic lesion is again identified, this time overlying the scapula. The posterior fifth rib is destroyed, with expansion and soft tissue density better seen on the chest radiograph.  IMPRESSION: Expansile destructive lesion of the RIGHT posterior fifth rib.   Electronically Signed   By: Geoffrey  Lamke M.D.   On: 02/06/2014 14:29    Labs:  CBC:  Recent Labs  01/16/14 0909 02/06/14 1249 02/16/14 1315 02/20/14 0725  WBC 6.8 7.4 8.1 8.0  HGB 15.2 14.9 14.3 15.1  HCT 46.0 42.5 43.9 45.2  PLT 137* 168 165 154    COAGS:  Recent Labs  02/20/14 0725  INR 1.06  APTT 29    BMP:  Recent Labs  10/04/13 1028 11/08/13 0902 01/16/14 0909 02/06/14 1249 02/16/14 1315  NA 142 141 138 140 140  K 4.4 4.5 4.2 4.6 4.3  CL 105 104 106 104 108  CO2 27 25 26 24 28  GLUCOSE 92 99 88 96 93  BUN 18 13 14 18 20  CALCIUM 9.1 9.2 9.0 9.2 8.7  CREATININE 0.75 0.84 0.84 0.76 0.75  GFRNONAA 87* 83*  --  89 87*  GFRAA >90 >90  --  >89 >90    LIVER FUNCTION TESTS:  Recent Labs  11/08/13 0902  01/16/14 0909 02/06/14 1249 02/16/14 1314 02/16/14 1315  BILITOT 0.5 0.4 0.4  --  0.6  AST 13 11 10  --  14  ALT 8 9 8  --  10  ALKPHOS 35* 38* 39  --  37*  PROT 6.6  7.0 6.1 6.0 PENDING 6.6  ALBUMIN 3.4* 3.6 3.6  --  3.6    Assessment and Plan: Multiple myeloma Seen by Dr. Penland on 02/16/2014 Scheduled today for image guided bone marrow biopsy with moderate sedation Patient has been nothing by mouth, all labs reviewed Risks and Benefits discussed with the patient. All of   the patient's questions were answered, patient is agreeable to proceed. Consent signed and in chart.     Signed: ,  D 02/20/2014, 8:40 AM  

## 2014-02-21 ENCOUNTER — Ambulatory Visit
Admission: RE | Admit: 2014-02-21 | Discharge: 2014-02-21 | Disposition: A | Discharge status: Home / Self Care | Payer: Medicare Other | Source: Ambulatory Visit | Attending: Radiation Oncology | Admitting: Radiation Oncology

## 2014-02-21 ENCOUNTER — Encounter: Payer: Self-pay | Admitting: Radiation Oncology

## 2014-02-21 VITALS — BP 153/75 | HR 87 | Temp 97.9°F | Resp 12 | Ht 69.0 in | Wt 236.6 lb

## 2014-02-21 DIAGNOSIS — C9000 Multiple myeloma not having achieved remission: Secondary | ICD-10-CM

## 2014-02-21 NOTE — Progress Notes (Signed)
Dean [name] has completed 1 fraction to his right posterior chest.  He reports he has pain in his right chest today that he is rating at a 4/10.  He is taking 2 tablets of percocet per day.  He reports a poor appetite.  He said he had a bone biopsy in his spine yesterday.    Review post sim eructation including fatigue, skin changes and cough.

## 2014-02-21 NOTE — Progress Notes (Signed)
  Radiation Oncology         ([phone]) [phone] ________________________________  Name: Dean [name] MRN: [id number]  Date: 02/21/2014  DOB: [birth year]  Weekly Radiation Therapy Management    ICD-9-CM ICD-10-CM   1. Multiple myeloma without remission         Current Dose: 3 Gy     Planned Dose:  30 Gy directed at the right posterior chest region  Narrative . . . . . . . . The patient presents for routine under treatment assessment.                                   The patient is without complaint. Pain unchanged thus far.   He underwent a right iliac bone marrow aspiration and biopsy yesterday                               Set-up films were reviewed.                                 The chart was checked. Physical Findings. . .  height is $RemoveB'5\' 9"'[id number]$  (1.753 m) and weight is 236 lb 9.6 oz (107.321 kg). His oral temperature is 97.9 F (36.6 C). His blood pressure is 153/75 and his pulse is 87. His respiration is 12. . Weight essentially stable.  No significant changes. Impression . . . . . . . The patient is tolerating radiation. Plan . . . . . . . . . . . . Continue treatment as planned.  ________________________________   Blair Promise, PhD, MD

## 2014-02-22 ENCOUNTER — Ambulatory Visit
Admission: RE | Admit: 2014-02-22 | Discharge: 2014-02-22 | Disposition: A | Discharge status: Home / Self Care | Payer: Medicare Other | Source: Ambulatory Visit | Attending: Radiation Oncology | Admitting: Radiation Oncology

## 2014-02-22 DIAGNOSIS — C9000 Multiple myeloma not having achieved remission: Secondary | ICD-10-CM | POA: Diagnosis not present

## 2014-02-23 ENCOUNTER — Telehealth: Payer: Self-pay | Admitting: Family Medicine

## 2014-02-23 ENCOUNTER — Encounter: Payer: Self-pay | Admitting: *Deleted

## 2014-02-23 ENCOUNTER — Ambulatory Visit
Admission: RE | Admit: 2014-02-23 | Discharge: 2014-02-23 | Disposition: A | Discharge status: Home / Self Care | Payer: Medicare Other | Source: Ambulatory Visit | Attending: Radiation Oncology | Admitting: Radiation Oncology

## 2014-02-23 DIAGNOSIS — C9000 Multiple myeloma not having achieved remission: Secondary | ICD-10-CM | POA: Diagnosis not present

## 2014-02-23 DIAGNOSIS — M545 Low back pain: Secondary | ICD-10-CM

## 2014-02-23 MED ORDER — OXYCODONE-ACETAMINOPHEN 10-325 MG PO TABS: 1.0000 | ORAL_TABLET | Freq: Three times a day (TID) | ORAL | Status: DC | PRN

## 2014-02-23 NOTE — Telephone Encounter (Signed)
RX printed, left up front and patient aware to pick up  

## 2014-02-23 NOTE — Telephone Encounter (Signed)
ok 

## 2014-02-23 NOTE — Progress Notes (Signed)
[facility] Psychosocial Distress Screening Clinical Social Work  Clinical Social Work was referred by distress screening protocol.  The patient scored a 10 on the Psychosocial Distress Thermometer which indicates severe distress. Clinical Social Worker attempted to see pt at radiation appt to assess for distress and other psychosocial needs, but pt changed his appt. CSW phoned pt as a result. CSW spoke with wife and explained role of CSW and Pt and Family Support Team. CW also shared support resources, multiple myeloma support group, etc. Wife very appreciative of call and will share all info and CSW contact info with pt. CSW also shared CSW availability at [facility] as well. CSW will attempt to follow up at future appts.   ONCBCN DISTRESS SCREENING 02/15/2014  Screening Type Initial Screening  Distress experienced in past week (1-10) 10  Emotional problem type Adjusting to illness;Adjusting to appearance changes  Information Concerns Type Lack of info about diagnosis  Physical Problem type Pain;Sleep/insomnia;Skin dry/itchy  Physician notified of physical symptoms Yes     Clinical Social Worker follow up needed: Yes.    If yes, follow up plan: See above Loren Racer, [place] Worker [place]  [facility] Phone: [phone] Fax: [phone]

## 2014-02-23 NOTE — Telephone Encounter (Signed)
[phone] PT is needing a refill on oxyCODONE-acetaminophen (PERCOCET) 10-325 MG per tablet

## 2014-02-23 NOTE — Telephone Encounter (Signed)
?   OK to Refill  

## 2014-02-24 ENCOUNTER — Ambulatory Visit: Payer: Medicare Other

## 2014-02-27 ENCOUNTER — Ambulatory Visit: Payer: Medicare Other

## 2014-02-28 ENCOUNTER — Encounter: Payer: Self-pay | Admitting: Radiation Oncology

## 2014-02-28 ENCOUNTER — Ambulatory Visit
Admission: RE | Admit: 2014-02-28 | Discharge: 2014-02-28 | Disposition: A | Discharge status: Home / Self Care | Payer: Medicare Other | Source: Ambulatory Visit | Attending: Radiation Oncology | Admitting: Radiation Oncology

## 2014-02-28 VITALS — BP 135/79 | HR 85 | Temp 98.9°F | Resp 20 | Wt 235.3 lb

## 2014-02-28 DIAGNOSIS — C9000 Multiple myeloma not having achieved remission: Secondary | ICD-10-CM

## 2014-02-28 NOTE — Progress Notes (Signed)
Weekly rad txs right post chest,  4/10 completed, took 1 percocet this am "Very little pain in my chest or back", no nausea, no head aches today, had mild one last night, took 2 asa prn, no sob 98% room air sats, sinus congestion stated, clear phelgm in am"from my Sinu's", energy level fair, is weak,appetite no taste in food, (sweet and sour) can tase,  10:27 AM

## 2014-02-28 NOTE — Progress Notes (Signed)
  Radiation Oncology         (336) [phone] ________________________________  Name: Dean [name] MRN: [id number]  Date: 02/28/2014  DOB: 1935/04/12  Weekly Radiation Therapy Management  Diagnosis: Multiple myeloma  Current Dose: 12 Gy     Planned Dose:  30 Gy  Narrative . . . . . . . . The patient presents for routine under treatment assessment.                                   The patient is without complaint. His pain in the right posterior chest has improved already with his treatments. He does complain of pain in his lower back from his history of compression fractures.                                 Set-up films were reviewed.                                 The chart was checked. Physical Findings. . .  weight is 235 lb 4.8 oz (106.731 kg). His oral temperature is 98.9 F (37.2 C). His blood pressure is 135/79 and his pulse is 85. His respiration is 20 and oxygen saturation is 98%. . The lungs are clear. The heart has a regular rhythm and rate. The abdomen is soft and nontender with normal bowel sounds. Impression . . . . . . . The patient is tolerating radiation. Plan . . . . . . . . . . . . Continue treatment as planned.  ________________________________   Blair Promise, PhD, MD

## 2014-03-01 ENCOUNTER — Ambulatory Visit
Admission: RE | Admit: 2014-03-01 | Discharge: 2014-03-01 | Disposition: A | Discharge status: Home / Self Care | Payer: Medicare Other | Source: Ambulatory Visit | Attending: Radiation Oncology | Admitting: Radiation Oncology

## 2014-03-01 DIAGNOSIS — C9000 Multiple myeloma not having achieved remission: Secondary | ICD-10-CM | POA: Diagnosis not present

## 2014-03-02 ENCOUNTER — Ambulatory Visit
Admission: RE | Admit: 2014-03-02 | Discharge: 2014-03-02 | Disposition: A | Discharge status: Home / Self Care | Payer: Medicare Other | Source: Ambulatory Visit | Attending: Radiation Oncology | Admitting: Radiation Oncology

## 2014-03-02 DIAGNOSIS — C9000 Multiple myeloma not having achieved remission: Secondary | ICD-10-CM | POA: Diagnosis not present

## 2014-03-02 LAB — TISSUE HYBRIDIZATION (BONE MARROW)-NCBH

## 2014-03-02 LAB — CHROMOSOME ANALYSIS, BONE MARROW

## 2014-03-03 ENCOUNTER — Ambulatory Visit
Admission: RE | Admit: 2014-03-03 | Discharge: 2014-03-03 | Disposition: A | Discharge status: Home / Self Care | Payer: Medicare Other | Source: Ambulatory Visit | Attending: Radiation Oncology | Admitting: Radiation Oncology

## 2014-03-03 DIAGNOSIS — C9000 Multiple myeloma not having achieved remission: Secondary | ICD-10-CM | POA: Diagnosis not present

## 2014-03-06 ENCOUNTER — Ambulatory Visit
Admission: RE | Admit: 2014-03-06 | Discharge: 2014-03-06 | Disposition: A | Discharge status: Home / Self Care | Payer: Medicare Other | Source: Ambulatory Visit | Attending: Radiation Oncology | Admitting: Radiation Oncology

## 2014-03-06 ENCOUNTER — Ambulatory Visit: Payer: Medicare Other

## 2014-03-06 DIAGNOSIS — C9000 Multiple myeloma not having achieved remission: Secondary | ICD-10-CM | POA: Diagnosis not present

## 2014-03-07 ENCOUNTER — Encounter (HOSPITAL_COMMUNITY): Payer: Medicare Other | Attending: Hematology and Oncology | Admitting: Hematology & Oncology

## 2014-03-07 ENCOUNTER — Ambulatory Visit: Payer: Medicare Other

## 2014-03-07 ENCOUNTER — Ambulatory Visit
Admission: RE | Admit: 2014-03-07 | Discharge: 2014-03-07 | Disposition: A | Discharge status: Home / Self Care | Payer: Medicare Other | Source: Ambulatory Visit | Attending: Radiation Oncology | Admitting: Radiation Oncology

## 2014-03-07 VITALS — BP 126/61 | HR 65 | Temp 97.6°F | Resp 17 | Wt 239.3 lb

## 2014-03-07 VITALS — BP 138/75 | HR 69 | Temp 97.8°F | Resp 16 | Ht 69.0 in | Wt 238.6 lb

## 2014-03-07 DIAGNOSIS — M899 Disorder of bone, unspecified: Secondary | ICD-10-CM | POA: Insufficient documentation

## 2014-03-07 DIAGNOSIS — M542 Cervicalgia: Secondary | ICD-10-CM | POA: Insufficient documentation

## 2014-03-07 DIAGNOSIS — R630 Anorexia: Secondary | ICD-10-CM | POA: Insufficient documentation

## 2014-03-07 DIAGNOSIS — R0781 Pleurodynia: Secondary | ICD-10-CM

## 2014-03-07 DIAGNOSIS — R634 Abnormal weight loss: Secondary | ICD-10-CM | POA: Insufficient documentation

## 2014-03-07 DIAGNOSIS — C9000 Multiple myeloma not having achieved remission: Secondary | ICD-10-CM

## 2014-03-07 DIAGNOSIS — M545 Low back pain: Secondary | ICD-10-CM

## 2014-03-07 DIAGNOSIS — Z923 Personal history of irradiation: Secondary | ICD-10-CM | POA: Insufficient documentation

## 2014-03-07 DIAGNOSIS — D472 Monoclonal gammopathy: Secondary | ICD-10-CM | POA: Insufficient documentation

## 2014-03-07 DIAGNOSIS — K219 Gastro-esophageal reflux disease without esophagitis: Secondary | ICD-10-CM | POA: Insufficient documentation

## 2014-03-07 DIAGNOSIS — N400 Enlarged prostate without lower urinary tract symptoms: Secondary | ICD-10-CM | POA: Insufficient documentation

## 2014-03-07 MED ORDER — ONDANSETRON HCL 8 MG PO TABS: 8.0000 mg | ORAL_TABLET | Freq: Three times a day (TID) | ORAL | Status: DC | PRN

## 2014-03-07 MED ORDER — DEXAMETHASONE 4 MG PO TABS: 40.0000 mg | ORAL_TABLET | ORAL | Status: DC

## 2014-03-07 MED ORDER — RADIAPLEXRX EX GEL
Freq: Once | CUTANEOUS | Status: AC
  Administered 2014-03-07: 11:00:00 via TOPICAL

## 2014-03-07 MED ORDER — CALCIUM-VITAMIN D3 600-500 MG-UNIT PO CAPS
1.0000 | ORAL_CAPSULE | Freq: Two times a day (BID) | ORAL | Status: DC
Start: 2014-03-07 — End: 2016-10-01

## 2014-03-07 NOTE — Progress Notes (Signed)
Dean [name] has completed 9 fractions to his right posterior chest.  He denies pain today.  He is taking 1-2 percocet per day to help with aching and getting off the treatment table.  He reports trouble sleeping and is taking 1 and a half xanax at night.  He is only able to sleep about 2 hours and then wakes up due to pain.  Advised him to take a percocet at night to see if this helps.  He denies having a cough.  His skin is intact on his right chest.  He is requesting a refill on radiaplex gel.  He has been given another tube.  He has been given a one month follow up card.

## 2014-03-07 NOTE — Addendum Note (Signed)
Encounter addended by: Jacqulyn Liner, RN on: 03/07/2014 10:39 AM<BR>     Documentation filed: Inpatient MAR

## 2014-03-07 NOTE — Patient Instructions (Signed)
[facility] Discharge Instructions  RECOMMENDATIONS MADE BY THE CONSULTANT AND ANY TEST RESULTS WILL BE SENT TO YOUR REFERRING PHYSICIAN.  SPECIAL INSTRUCTIONS/FOLLOW-UP:  Please call prior to your next visit with any problems or concerns. We will see you back again in two with labs and an office visit. We will arrange for chemotherapy therapy and plan on starting you next week.    Thank you for choosing [facility] to provide your oncology and hematology care.  To afford each patient quality time with our providers, please arrive at least 15 minutes before your scheduled appointment time.  With your help, our goal is to use those 15 minutes to complete the necessary work-up to ensure our physicians have the information they need to help with your evaluation and healthcare recommendations.    Effective January 1st, 2014, we ask that you re-schedule your appointment with our physicians should you arrive 10 or more minutes late for your appointment.  We strive to give you quality time with our providers, and arriving late affects you and other patients whose appointments are after yours.    Again, thank you for choosing [facility].  Our hope is that these requests will decrease the amount of time that you wait before being seen by our physicians.       _____________________________________________________________  Should you have questions after your visit to [facility], please contact our office at [phone] between the hours of 8:30 a.m. and 5:00 p.m.  Voicemails left after 4:30 p.m. will not be returned until the following business day.  For prescription refill requests, have your pharmacy contact our office with your prescription refill request.

## 2014-03-07 NOTE — Progress Notes (Signed)
Dean Fraction, MD [address] [place] [address]  Left occipital condyle infiltrative lesion, not amenable to biopsy. Palliative XRT without a tissue diagnosis  Chest x-ray and right rib films were obtained which showed a soft tissue mass along the right posterior fifth rib with some rib destruction.  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 - SEE COMMENT. PERIPHERAL BLOOD: - NO SIGNIFICANT MORPHOLOGIC ABNORMALITIES. Diagnosis Note The plasma cells are increased in the marrow representing 33% of all cells in the aspirate associated with interstitial infiltrates, variably sized aggregates and large sheets in the clot and biopsy sections consistent with plasma cell neoplasm. Correlation with cytogenetic and FISH studies is recommended. For completion, immunohistochemical stains for CD138, kappa and lambda will be performed and reported in an addendum.  Myeloma survey on 12/01/2012 with L3 superior endplate compression FX, no lytic lesions to suggest Myeloma  CURRENT THERAPY: XRT  INTERVAL HISTORY: Dean Lawless80y.o. male returns for follow-up of probable myeloma.he presented initially with a left occipital condyle infiltrative lesion that was not amenable to biopsy and underwent palliative radiation. He is now presented with back and rib pain with x-ray showing a soft tissue mass in the posterior right fifth rib.   Bone marrow biopsy and is here today to review the results. His daughter is with him again. He is willing to start therapy if it is recommended to him after our discussion today.   MEDICAL  HISTORY: Past Medical History  Diagnosis Date  . Neuropathy   . BPH (benign prostatic hyperplasia)   . GERD (gastroesophageal reflux disease)   . H/O ETOH abuse   . Skull lesion     Left occipital condyle  . History of radiation therapy 01/05/13-02/10/13    45 gray to left occipital condyle region  . Multiple myeloma     has Neuropathy; BPH (benign prostatic hyperplasia); GERD (gastroesophageal reflux disease); H/O ETOH abuse; Skull lesion; Neck pain; Fatigue; Loss of weight; Insomnia; MGUS (monoclonal gammopathy of unknown significance); Allergic rhinitis; Lumbar compression fracture; Dermatitis; and Multiple myeloma without remission on his problem list.      Multiple myeloma without remission   11/16/2012 Initial Diagnosis L occipital condyle destructive lesion, not biopsied secondary to location. XRT   12/01/2012 Imaging L3 superior endplate compression FX, no lytic lesions to suggest myeloma   02/06/2014 Imaging soft tissue mass along the right posterior fifth rib with some rib destruction   02/10/2014 - 03/07/2014 Radiation Therapy 30Gy to R rib lesion, lesion not biopsied   02/20/2014 Bone Marrow Biopsy Normal cytogenetics and myeloma FISH profile.   03/16/2014 PET scan Scattered hypermetabolic osseous lesions, including the calvarium, ribs, left scapula, sacrum, and left femoral shaft. An expansile left vertebral body lesion at L3 extends into the epidural space of the left lateral recess,     Miscellaneous Normal CBC, Normal CMP, kappa,lamda ratio of 2.62 (abnormal), UIEP with slightly restricted band, IgG kappa, SPEP/IEP with monoclonal protein at 0.79 g/dl, normal igG, suppressed IGM     is allergic to codeine and morphine and related.  Dean [name]  does not currently have medications on file.  SURGICAL HISTORY: Past Surgical History  Procedure Laterality Date  . Cyst excision  1959    tail bone    SOCIAL HISTORY: History   Social History  . Marital Status: Married    Spouse  Name: N/A  . Number of Children: 3  . Years of Education: N/A   Occupational History  .     Social History Main Topics  . Smoking status: Never Smoker   . Smokeless tobacco: Never Used  . Alcohol Use: No     Comment: " Not much no more"  . Drug Use: No  . Sexual Activity: Not on file   Other Topics Concern  . Not on file   Social History Narrative    FAMILY HISTORY: Family History  Problem Relation Age of Onset  . Diabetes Mother   . Stroke Mother   . Hypertension Father     Review of Systems  Constitutional: Positive for malaise/fatigue.  Eyes: Negative.   Respiratory: Negative.   Cardiovascular: Negative.   Gastrointestinal: Negative.   Genitourinary: Negative.   Musculoskeletal: Positive for neck pain.       Rib pain  Skin: Negative.   Neurological: Negative.   Endo/Heme/Allergies: Negative.   Psychiatric/Behavioral: Negative.     PHYSICAL EXAMINATION  ECOG PERFORMANCE STATUS: 1 - Symptomatic but completely ambulatory  Filed Vitals:   03/07/14 1419  BP: 126/61  Pulse: 65  Temp: 97.6 F (36.4 C)  Resp: 17    Physical Exam  Constitutional: He is oriented to person, place, and time and well-developed, well-nourished, and in no distress.  HENT:  Head: Normocephalic and atraumatic.  Nose: Nose normal.  Mouth/Throat: Oropharynx is clear and moist. No oropharyngeal exudate.  Eyes: Conjunctivae and EOM are normal. Pupils are equal, round, and reactive to light. Right eye exhibits no discharge. Left eye exhibits no discharge. No scleral icterus.  Neck: Normal range of motion. Neck supple. No tracheal deviation present. No thyromegaly present.  Cardiovascular: Normal rate, regular rhythm and normal heart sounds.  Exam reveals no gallop and no friction rub.   No murmur heard. Pulmonary/Chest: Effort normal and breath sounds normal. He has no wheezes. He has no rales.  Abdominal: Soft. Bowel sounds are normal. He exhibits no distension and no mass. There is  no tenderness. There is no rebound and no guarding.  Musculoskeletal: Normal range of motion. He exhibits no edema.  Lymphadenopathy:    He has no cervical adenopathy.  Neurological: He is alert and oriented to person, place, and time. He has normal reflexes. No cranial nerve deficit. Gait normal. Coordination normal.  Skin: Skin is warm and dry. No rash noted.  Psychiatric: Mood, memory, affect and judgment normal.  Nursing note and vitals reviewed.   LABORATORY DATA:  CBC    Component Value Date/Time   WBC 5.8 03/17/2014 0907   RBC 4.81 03/17/2014 0907   HGB 14.1 03/17/2014 0907   HCT 42.2 03/17/2014 0907   PLT 156 03/17/2014 0907   MCV 87.7 03/17/2014 0907   MCH 29.3 03/17/2014 0907   MCHC 33.4 03/17/2014 0907   RDW 14.0 03/17/2014 0907   LYMPHSABS 1.4 03/17/2014 0907   MONOABS 0.4 03/17/2014 0907   EOSABS 0.1 03/17/2014 0907   BASOSABS 0.0 03/17/2014 0907   CMP     Component Value Date/Time   NA 137 03/17/2014 0907   K 4.1 03/17/2014 0907   CL 110 03/17/2014 0907   CO2 24 03/17/2014 0907  GLUCOSE 110* 03/17/2014 0907   BUN 19 03/17/2014 0907   CREATININE 0.68 03/17/2014 0907   CREATININE 0.76 02/06/2014 1249   CALCIUM 8.5 03/17/2014 0907   PROT 6.8 03/17/2014 0907   PROT 6.4 03/17/2014 0907   ALBUMIN 3.4* 03/17/2014 0907   AST 14 03/17/2014 0907   ALT 10 03/17/2014 0907   ALKPHOS 28* 03/17/2014 0907   BILITOT 0.4 03/17/2014 0907   GFRNONAA >90 03/17/2014 0907   GFRNONAA 89 02/06/2014 1249   GFRAA >90 03/17/2014 0907   GFRAA >89 02/06/2014 1249      RADIOGRAPHIC STUDIES:  EXAM: CHEST 2 VIEW  COMPARISON: Chest CT 12/31/2012.  FINDINGS: There is a new expansile osteolytic mass in the RIGHT upper chest. This destroys the posterior lateral fifth rib. The mass measures 26 mm x 62 mm. 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 The cardiopericardial silhouette is within normal limits. There is no pneumothorax. No  pleural effusion.  IMPRESSION: New RIGHT upper chest mass with RIGHT fifth rib destruction suspicious for multiple myeloma in a patient with monoclonal gammopathy.   Electronically Signed  By: Dereck Ligas M.D.  On: 02/06/2014 14:26   CLINICAL DATA: Occipital condyle mass. Evaluate for multiple myeloma lesions.  EXAM: METASTATIC BONE SURVEY  COMPARISON: CT chest abdomen pelvis 11/30/2012.  FINDINGS: AP and lateral cervical spine views are included in a dedicated exam performed the same day. Frontal view of the chest shows midline trachea and normal heart size. Lungs are clear. No pleural fluid.  No definite lucent lesions. The frontal view of the lumbar spine is degraded by motion. Mild superior endplate compression involving the L3 vertebral body, as on yesterday's exam. Multilevel spondylosis.  IMPRESSION: 1. No lytic lesions to indicate multiple myeloma. 2. AP and lateral views of the cervical spine are included in the dedicated cervical spine series performed the same day. 3. L3 superior endplate compression fracture, as on 11/30/2012.   Electronically Signed  By: Lorin Picket M.D.  On: 12/01/2012 15:34     ASSESSMENT and THERAPY PLAN:    Multiple myeloma without remission Present 79 year old male with IgG kappa multiple myeloma based upon bone marrow biopsy and the presence of bone disease. I am somewhat concerned however that the myeloma survey is not giving [place] a good picture of his bone disease. I have recommended a PET scan which is in accordance with the NCCN guidelines. Feel very strongly we need to get a better assessment of the extent of his bone involvement.  He has a normal CBC, normal kidney function, barely detectable M spike, normal calcium, and following his disease moving forward may require additional imaging. But we will address this once he starts therapy. I discussed current guidelines with the patient and his daughter and  based upon his bone marrow biopsy and cytogenetics have recommended RVD and zometa.  I spent an extensive amount of time discussing side effects of treatment, benefits of treatment, and assured them that we are here to help him get through therapy. We discussed remission rates with RVD. We will arrange for formal chemotherapy teaching. I will see him back prior to initiation of therapy.     All questions were answered. The patient knows to call the clinic with any problems, questions or concerns. We can certainly see the patient much sooner if necessary. Molli Hazard 03/21/2014

## 2014-03-07 NOTE — Progress Notes (Signed)
  Radiation Oncology         (336) [phone] ________________________________  Name: Dean [name] MRN: [id number]  Date: 03/07/2014  DOB: [birth year]  Weekly Radiation Therapy Management  Diagnosis: Multiple myeloma  Current Dose: 27 Gy     Planned Dose:  30 Gy  Narrative . . . . . . . . The patient presents for routine under treatment assessment.                                   The patient is without complaint. He is essentially pain free at this time regarding his right posterior chest pain.                                 Set-up films were reviewed.                                 The chart was checked. Physical Findings. . .  height is $RemoveB'5\' 9"'qjKqHGcq$  (1.753 m) and weight is 238 lb 9.6 oz (108.228 kg). His oral temperature is 97.8 F (36.6 C). His blood pressure is 138/75 and his pulse is 69. His respiration is 16. . The lungs are clear. The heart has a regular rhythm and rate. Impression . . . . . . . The patient is tolerating radiation. Plan . . . . . . . . . . . . Continue treatment as planned. The patient will meet later today with medical oncology to discuss bone marrow biopsy findings and potential therapy.  ________________________________   Blair Promise, PhD, MD

## 2014-03-08 ENCOUNTER — Ambulatory Visit
Admission: RE | Admit: 2014-03-08 | Discharge: 2014-03-08 | Disposition: A | Discharge status: Home / Self Care | Payer: Medicare Other | Source: Ambulatory Visit | Attending: Radiation Oncology | Admitting: Radiation Oncology

## 2014-03-08 ENCOUNTER — Ambulatory Visit: Payer: Medicare Other

## 2014-03-08 DIAGNOSIS — C9000 Multiple myeloma not having achieved remission: Secondary | ICD-10-CM | POA: Diagnosis not present

## 2014-03-09 ENCOUNTER — Other Ambulatory Visit (HOSPITAL_COMMUNITY): Payer: Self-pay | Admitting: Oncology

## 2014-03-09 ENCOUNTER — Ambulatory Visit: Payer: Medicare Other

## 2014-03-09 DIAGNOSIS — C9000 Multiple myeloma not having achieved remission: Secondary | ICD-10-CM

## 2014-03-09 MED ORDER — ACYCLOVIR 400 MG PO TABS: 400.0000 mg | ORAL_TABLET | Freq: Two times a day (BID) | ORAL | Status: DC

## 2014-03-09 MED ORDER — ASPIRIN EC 325 MG PO TBEC: 325.0000 mg | DELAYED_RELEASE_TABLET | Freq: Every day | ORAL | Status: DC

## 2014-03-09 MED ORDER — LENALIDOMIDE 25 MG PO CAPS: 25.0000 mg | ORAL_CAPSULE | Freq: Every day | ORAL | Status: DC

## 2014-03-09 MED ORDER — DEXAMETHASONE 4 MG PO TABS: ORAL_TABLET | ORAL | Status: DC

## 2014-03-09 NOTE — Patient Instructions (Addendum)
[place]   CHEMOTHERAPY INSTRUCTIONS  Velcade - You will take this on Day 1, 8, 15 every 28 days here at the [facility]. This will be an injection in your abdominal tissue.  Side Effects: peripheral neuropathy, (numbness, tingling burning in hands/fingers/feet/toes), hypotension, nausea, vomiting, diarrhea, blurred vision, fatigue, bone marrow suppression (low white blood cells-fight infection, low red blood cells- this makes up your hemoglobin & circulating blood volume, low platelets-this is what helps your blood to clot)   Revlimid - chemo med - You will take this medication by mouth daily (Days 1-14). Side effects: neutropenia (low white blood cells), thrombocytopenia (low platelets), deep vein thrombosis (blood clot in extremity), pulmonary embolism (blood clot in lung), itching, rash, dry skin, diarrhea, constipation, nausea, vomiting, fatigue, dizziness, headache, muscle cramps/aches, inflammation of the nose and throat, fever, upper respiratory tract infections.   Zometa - Zoledronic acid- is also used with cancer chemotherapy to treat bone problems that may occur with multiple myeloma and other types of cancer (such as breast, lung) that have spread to the bones. Zoledronic acid belongs to a class of drugs known as bisphosphonates. It lowers high blood calcium levels by reducing the amount of calcium released from your bones into your blood. It also works by slowing the breakdown of your bones by cancer to prevent bone fractures. This medication is given by injection into a vein over at least 15 minutes. This is given once every 28 days. This medication can cause osteonecrosis (bone rotting). You can not go to the dentist for anything more than a simple teeth cleaning without letting us know. Anything more than a simple teeth cleaning must be cleared by Dr. Whitney Muse first. You must tell your dentist that you are taking Zometa. We do not want to cause  problems in your jaw bone!!!!!!  Side Effects:  Dizziness, headache, or flu-like symptoms (such as fever, chills, muscle/joint aches) may occur. Tell us immediately if you have: increased or severe bone/joint/muscle pain, new or unusual hip/thigh/groin pain, jaw pain, eye/vision problems, numbness/tingling, muscle spasms, irregular heartbeat.   POTENTIAL SIDE EFFECTS OF TREATMENT: Increased Susceptibility to Infection, Vomiting, Constipation, Hair Thinning, Changes in Character of Skin and Nails (brittleness, dryness,etc.), Bone Marrow Suppression, Nausea and Mouth Sores    EDUCATIONAL MATERIALS GIVEN AND REVIEWED: Chemotherapy and You booklet Specific Instructions Sheets: Velcade, Revlimid, Dexamethasone, Zofran, Acyclovir, Aspirin, Dexamethasone, Zometa   SELF CARE ACTIVITIES WHILE ON CHEMOTHERAPY: Increase your fluid intake 48 hours prior to treatment and drink at least 2 quarts per day after treatment., No alcohol intake., No aspirin or other medications unless approved by your oncologist., Eat foods that are light and easy to digest., Eat foods at cold or room temperature., No fried, fatty, or spicy foods immediately before or after treatment., Have teeth cleaned professionally before starting treatment. Keep dentures and partial plates clean., Use soft toothbrush and do not use mouthwashes that contain alcohol. Biotene is a good mouthwash that is available at most pharmacies or may be ordered by calling [phone]., Use warm salt water gargles (1 teaspoon salt per 1 quart warm water) before and after meals and at bedtime. Or you may rinse with 2 tablespoons of three -percent hydrogen peroxide mixed in eight ounces of water., Always use sunscreen with SPF (Sun Protection Factor) of 30 or higher., Use your nausea medication as directed to prevent nausea., Use your stool softener or laxative as directed to prevent constipation. and Use your anti-diarrheal medication as directed  to stop  diarrhea.  Please wash your hands for at least 30 seconds using warm soapy water. Handwashing is the #1 way to prevent the spread of germs. Stay away from sick people or people who are getting over a cold. If you develop respiratory systems such as green/yellow mucus production or productive cough or persistent cough let us know and we will see if you need an antibiotic. It is a good idea to keep a pair of gloves on when going into grocery stores/[employer] to decrease your risk of coming into contact with germs on the carts, etc. Carry alcohol hand gel with you at all times and use it frequently if out in public. All foods need to be cooked thoroughly. No raw foods. No medium or undercooked meats, eggs. If your food is cooked medium well, it does not need to be hot pink or saturated with bloody liquid at all. Vegetables and fruits need to be washed/rinsed under the faucet with a dish detergent before being consumed. You can eat raw fruits and vegetables unless we tell you otherwise but it would be best if you cooked them or bought frozen. Do not eat off of salad bars or hot bars unless you really trust the cleanliness of the restaurant. If you need dental work, please let Dr. Whitney Muse know before you go for your appointment so that we can coordinate the best possible time for you in regards to your chemo regimen. You need to also let your dentist know that you are actively taking chemo. We may need to do labs prior to your dental appointment. We also want your bowels moving at least every other day. If this is not happening, we need to know so that we can get you on a bowel regimen to help you go.    MEDICATIONS: You have been given prescriptions for the following medications:  Revlimid 34m capsule. Take 1 capsule by mouth daily on days 1-14 every 28 days. Refer to your calendar.   Aspirin 3264mtablet. Take 1 tablet daily.   Acyclovir 40043mablet. Take 1 tablet by mouth in the morning and in the evening  everyday.   Zofran/Ondansetron 8 mg tablet. Take 1 tablet every 8 hours as needed for nausea/vomiting.   Dexamethasone 4mg10mblet. Take 10 tablets (40mg22mal) on Days 1, 8, & 15 of chemo treatment cycle. Refer to your calendar.  Calcium 600mg/76mmin D 500mg. 17m 1 tablet two times a day.     Over-the-Counter Meds:  Senna - this is a mild laxative used to treat mild constipation. May take 1-8 tabs by mouth daily as needed for mild constipation.  Milk of Magnesia - this is a laxative used to treat moderate to severe constipation. May take 2-4 tablespoons every 8 hours as needed. May increase to 8 tablespoons x 1 dose and if no bowel movement call the [facility] - this is for diarrhea. Take 2 tabs after 1st loose stool and then 1 tab every 2 hour until you go a total of 12 hours without a loose stool. Call [facility] stools continue.   SYMPTOMS TO REPORT AS SOON AS POSSIBLE AFTER TREATMENT:  FEVER GREATER THAN 100.5 F  CHILLS WITH OR WITHOUT FEVER  NAUSEA AND VOMITING THAT IS NOT CONTROLLED WITH YOUR NAUSEA MEDICATION  UNUSUAL SHORTNESS OF BREATH  UNUSUAL BRUISING OR BLEEDING  TENDERNESS IN MOUTH AND THROAT WITH OR WITHOUT PRESENCE OF ULCERS  URINARY PROBLEMS  BOWEL PROBLEMS  UNUSUAL RASH  Wear comfortable clothing and clothing appropriate for easy access to any Portacath or PICC line. Let us know if there is anything that we can do to make your therapy better!      I have been informed and understand all of the instructions given to me and have received a copy. I have been instructed to call the clinic [phone] or my family physician as soon as possible for continued medical care, if indicated. I do not have any more questions at this time but understand that I may call the [facility] or the Patient Navigator at [phone] during office hours should I have questions or need assistance in obtaining follow-up care.  Lenalidomide  Oral Capsules What is this medicine? LENALIDOMIDE (len a LID oh mide) is a chemotherapy drug that targets specific proteins within cancer cells and stops the cancer cell from growing. It is used to treat multiple myeloma, mantle cell lymphoma, and some myelodysplastic syndromes that cause severe anemia requiring blood transfusions. This medicine may be used for other purposes; ask your health care provider or pharmacist if you have questions. COMMON BRAND NAME(S): Revlimid What should I tell my health care provider before I take this medicine? They need to know if you have any of these conditions: -blood clots in the legs or the lungs -high blood pressure -high cholesterol -infection -irregular monthly periods or menstrual cycles -kidney disease -liver disease -smoke tobacco -thyroid disease -an unusual or allergic reaction to lenalidomide, other medicines, foods, dyes, or preservatives -pregnant or trying to get pregnant -breast-feeding How should I use this medicine? Take this medicine by mouth with a glass of water. Follow the directions on the prescription label. Do not cut, crush, or chew this medicine. Take your medicine at regular intervals. Do not take it more often than directed. Do not stop taking except on your doctor's advice. A MedGuide will be given with each prescription and refill. Read this guide carefully each time. The MedGuide may change frequently. Talk to your pediatrician regarding the use of this medicine in children. Special care may be needed. Overdosage: If you think you have taken too much of this medicine contact a poison control center or emergency room at once. NOTE: This medicine is only for you. Do not share this medicine with others. What if I miss a dose? If you miss a dose, take it as soon as you can. If your next dose is to be taken in less than 12 hours, then do not take the missed dose. Take the next dose at your regular time. Do not take double or  extra doses. What may interact with this medicine? This medicine may interact with the following medications: -digoxin -medicines that increase the risk of thrombosis like estrogens or erythropoietic agents (e.g., epoetin alfa and darbepoetin alfa) -warfarin This list may not describe all possible interactions. Give your health care provider a list of all the medicines, herbs, non-prescription drugs, or dietary supplements you use. Also tell them if you smoke, drink alcohol, or use illegal drugs. Some items may interact with your medicine. What should I watch for while using this medicine? Visit your doctor for regular check ups. Tell your doctor or healthcare professional if your symptoms do not start to get better or if they get worse. You will need to have important blood work done while you are taking this medicine. This medicine is available only through a special program. Doctors, pharmacies, and patients must meet all of the conditions of  the program. Your health care provider will help you get signed up with the program if you need this medicine. Through the program you will only receive up to a 28 day supply of the medicine at one time. You will need a new prescription for each refill. This medicine can cause birth defects. Do not get pregnant while taking this drug. Females with child-bearing potential will need to have 2 negative pregnancy tests before starting this medicine. Pregnancy testing must be done every 2 to 4 weeks as directed while taking this medicine. Use 2 reliable forms of birth control together while you are taking this medicine and for 1 month after you stop taking this medicine. If you think that you might be pregnant talk to your doctor right away. Men must use a latex condom during sexual contact with a woman while taking this medicine and for 28 days after you stop taking this medicine. A latex condom is needed even if you have had a vasectomy. Contact your doctor right  away if your partner becomes pregnant. Do not donate sperm while taking this medicine and for 28 days after you stop taking this medicine. Do not give blood while taking the medicine and for 1 month after completion of treatment to avoid exposing pregnant women to the medicine through the donated blood. Talk to your doctor about your risk of cancer. You may be more at risk for certain types of cancers if you take this medicine. What side effects may I notice from receiving this medicine? Side effects that you should report to your doctor or health care professional as soon as possible: -allergic reactions like skin rash, itching or hives, swelling of the face, lips, or tongue -breathing problems -chest pain or tightness -fast, irregular heartbeat -low blood counts - this medicine may decrease the number of white blood cells, red blood cells and platelets. You may be at increased risk for infections and bleeding. -seizures -signs and symptoms of bleeding such as bloody or black, tarry stools; red or dark-brown urine; spitting up blood or brown material that looks like coffee grounds; red spots on the skin; unusual bruising or bleeding from the eye, gums, or nose -signs and symptoms of a blood clot such as breathing problems; changes in vision; chest pain; severe, sudden headache; pain, swelling, warmth in the leg; trouble speaking; sudden numbness or weakness of the face, arm or leg -signs and symptoms of liver injury like dark yellow or brown urine; general ill feeling or flu-like symptoms; light-colored stools; loss of appetite; nausea; right upper belly pain; unusually weak or tired; yellowing of the eyes or skin -signs and symptoms of a stroke like changes in vision; confusion; trouble speaking or understanding; severe headaches; sudden numbness or weakness of the face, arm or leg; trouble walking; dizziness; loss of balance or coordination -sweating -vomiting Side effects that usually do not  require medical attention (report to your doctor or health care professional if they continue or are bothersome): -constipation -cough -diarrhea -tiredness This list may not describe all possible side effects. Call your doctor for medical advice about side effects. You may report side effects to FDA at 1-800-FDA-1088. Where should I keep my medicine? Keep out of the reach of children. Store at room temperature between 15 and 30 degrees C (59 and 86 degrees F). Throw away any unused medicine after the expiration date. NOTE: This sheet is a summary. It may not cover all possible information. If you have questions about this medicine, talk  to your doctor, pharmacist, or health care provider.  2015, Elsevier/Gold Standard. (2013-04-26 18:30:01) Bortezomib injection What is this medicine? BORTEZOMIB (bor TEZ oh mib) is a chemotherapy drug. It slows the growth of cancer cells. This medicine is used to treat multiple myeloma, and certain lymphomas, such as mantle-cell lymphoma. This medicine may be used for other purposes; ask your health care provider or pharmacist if you have questions. COMMON BRAND NAME(S): Velcade What should I tell my health care provider before I take this medicine? They need to know if you have any of these conditions: -diabetes -heart disease -irregular heartbeat -liver disease -on hemodialysis -low blood counts, like low white blood cells, platelets, or hemoglobin -peripheral neuropathy -taking medicine for blood pressure -an unusual or allergic reaction to bortezomib, mannitol, boron, other medicines, foods, dyes, or preservatives -pregnant or trying to get pregnant -breast-feeding How should I use this medicine? This medicine is for injection into a vein or for injection under the skin. It is given by a health care professional in a hospital or clinic setting. Talk to your pediatrician regarding the use of this medicine in children. Special care may be  needed. Overdosage: If you think you have taken too much of this medicine contact a poison control center or emergency room at once. NOTE: This medicine is only for you. Do not share this medicine with others. What if I miss a dose? It is important not to miss your dose. Call your doctor or health care professional if you are unable to keep an appointment. What may interact with this medicine? This medicine may interact with the following medications: -ketoconazole -rifampin -ritonavir -St. John's Wort This list may not describe all possible interactions. Give your health care provider a list of all the medicines, herbs, non-prescription drugs, or dietary supplements you use. Also tell them if you smoke, drink alcohol, or use illegal drugs. Some items may interact with your medicine. What should I watch for while using this medicine? Visit your doctor for checks on your progress. This drug may make you feel generally unwell. This is not uncommon, as chemotherapy can affect healthy cells as well as cancer cells. Report any side effects. Continue your course of treatment even though you feel ill unless your doctor tells you to stop. You may get drowsy or dizzy. Do not drive, use machinery, or do anything that needs mental alertness until you know how this medicine affects you. Do not stand or sit up quickly, especially if you are an older patient. This reduces the risk of dizzy or fainting spells. In some cases, you may be given additional medicines to help with side effects. Follow all directions for their use. Call your doctor or health care professional for advice if you get a fever, chills or sore throat, or other symptoms of a cold or flu. Do not treat yourself. This drug decreases your body's ability to fight infections. Try to avoid being around people who are sick. This medicine may increase your risk to bruise or bleed. Call your doctor or health care professional if you notice any unusual  bleeding. You may need blood work done while you are taking this medicine. In some patients, this medicine may cause a serious brain infection that may cause death. If you have any problems seeing, thinking, speaking, walking, or standing, tell your doctor right away. If you cannot reach your doctor, urgently seek other source of medical care. Do not become pregnant while taking this medicine. Women should inform their  doctor if they wish to become pregnant or think they might be pregnant. There is a potential for serious side effects to an unborn child. Talk to your health care professional or pharmacist for more information. Do not breast-feed an infant while taking this medicine. Check with your doctor or health care professional if you get an attack of severe diarrhea, nausea and vomiting, or if you sweat a lot. The loss of too much body fluid can make it dangerous for you to take this medicine. What side effects may I notice from receiving this medicine? Side effects that you should report to your doctor or health care professional as soon as possible: -allergic reactions like skin rash, itching or hives, swelling of the face, lips, or tongue -breathing problems -changes in hearing -changes in vision -fast, irregular heartbeat -feeling faint or lightheaded, falls -pain, tingling, numbness in the hands or feet -right upper belly pain -seizures -swelling of the ankles, feet, hands -unusual bleeding or bruising -unusually weak or tired -vomiting -yellowing of the eyes or skin Side effects that usually do not require medical attention (report to your doctor or health care professional if they continue or are bothersome): -changes in emotions or moods -constipation -diarrhea -loss of appetite -headache -irritation at site where injected -nausea This list may not describe all possible side effects. Call your doctor for medical advice about side effects. You may report side effects to FDA  at 1-800-FDA-1088. Where should I keep my medicine? This drug is given in a hospital or clinic and will not be stored at home. NOTE: This sheet is a summary. It may not cover all possible information. If you have questions about this medicine, talk to your doctor, pharmacist, or health care provider.  2015, [employer]vier/Gold Standard. (2012-11-15 12:46:32) Dexamethasone tablets What is this medicine? DEXAMETHASONE (dex a METH a sone) is a corticosteroid. It is commonly used to treat inflammation of the skin, joints, lungs, and other organs. Common conditions treated include asthma, allergies, and arthritis. It is also used for other conditions, such as blood disorders and diseases of the adrenal glands. This medicine may be used for other purposes; ask your health care provider or pharmacist if you have questions. COMMON BRAND NAME(S): Decadron, DexPak [place] Big, DexPak TaperPak, Zema-Pak What should I tell my health care provider before I take this medicine? They need to know if you have any of these conditions: -Cushing's syndrome -diabetes -glaucoma -heart problems or disease -high blood pressure -infection like herpes, measles, tuberculosis, or chickenpox -kidney disease -liver disease -mental problems -myasthenia gravis -osteoporosis -previous heart attack -seizures -stomach, ulcer or intestine disease including colitis and diverticulitis -thyroid problem -an unusual or allergic reaction to dexamethasone, corticosteroids, other medicines, lactose, foods, dyes, or preservatives -pregnant or trying to get pregnant -breast-feeding How should I use this medicine? Take this medicine by mouth with a drink of water. Follow the directions on the prescription label. Take it with food or milk to avoid stomach upset. If you are taking this medicine once a day, take it in the morning. Do not take more medicine than you are told to take. Do not suddenly stop taking your medicine because you may  develop a severe reaction. Your doctor will tell you how much medicine to take. If your doctor wants you to stop the medicine, the dose may be slowly lowered over time to avoid any side effects. Talk to your pediatrician regarding the use of this medicine in children. Special care may be needed. Patients over  72 years old may have a stronger reaction and need a smaller dose. Overdosage: If you think you have taken too much of this medicine contact a poison control center or emergency room at once. NOTE: This medicine is only for you. Do not share this medicine with others. What if I miss a dose? If you miss a dose, take it as soon as you can. If it is almost time for your next dose, talk to your doctor or health care professional. You may need to miss a dose or take an extra dose. Do not take double or extra doses without advice. What may interact with this medicine? Do not take this medicine with any of the following medications: -mifepristone, RU-486 -vaccines This medicine may also interact with the following medications: -amphotericin B -antibiotics like clarithromycin, erythromycin, and troleandomycin -aspirin and aspirin-like drugs -barbiturates like phenobarbital -carbamazepine -cholestyramine -cholinesterase inhibitors like donepezil, galantamine, rivastigmine, and tacrine -cyclosporine -digoxin -diuretics -ephedrine -male hormones, like estrogens or progestins and birth control pills -indinavir -isoniazid -ketoconazole -medicines for diabetes -medicines that improve muscle tone or strength for conditions like myasthenia gravis -NSAIDs, medicines for pain and inflammation, like ibuprofen or naproxen -phenytoin -rifampin -thalidomide -warfarin This list may not describe all possible interactions. Give your health care provider a list of all the medicines, herbs, non-prescription drugs, or dietary supplements you use. Also tell them if you smoke, drink alcohol, or use  illegal drugs. Some items may interact with your medicine. What should I watch for while using this medicine? Visit your doctor or health care professional for regular checks on your progress. If you are taking this medicine over a prolonged period, carry an identification card with your name and address, the type and dose of your medicine, and your doctor's name and address. This medicine may increase your risk of getting an infection. Stay away from people who are sick. Tell your doctor or health care professional if you are around anyone with measles or chickenpox. If you are going to have surgery, tell your doctor or health care professional that you have taken this medicine within the last twelve months. Ask your doctor or health care professional about your diet. You may need to lower the amount of salt you eat. The medicine can increase your blood sugar. If you are a diabetic check with your doctor if you need help adjusting the dose of your diabetic medicine. What side effects may I notice from receiving this medicine? Side effects that you should report to your doctor or health care professional as soon as possible: -allergic reactions like skin rash, itching or hives, swelling of the face, lips, or tongue -changes in vision -fever, sore throat, sneezing, cough, or other signs of infection, wounds that will not heal -increased thirst -mental depression, mood swings, mistaken feelings of self importance or of being mistreated -pain in hips, back, ribs, arms, shoulders, or legs -redness, blistering, peeling or loosening of the skin, including inside the mouth -trouble passing urine or change in the amount of urine -swelling of feet or lower legs -unusual bleeding or bruising Side effects that usually do not require medical attention (report to your doctor or health care professional if they continue or are bothersome): -headache -nausea, vomiting -skin problems, acne, thin and shiny  skin -weight gain This list may not describe all possible side effects. Call your doctor for medical advice about side effects. You may report side effects to FDA at 1-800-FDA-1088. Where should I keep my medicine? Keep out of  the reach of children. Store at room temperature between 20 and 25 degrees C (68 and 77 degrees F). Protect from light. Throw away any unused medicine after the expiration date. NOTE: This sheet is a summary. It may not cover all possible information. If you have questions about this medicine, talk to your doctor, pharmacist, or health care provider.  2015, Elsevier/Gold Standard. (2007-05-13 14:02:13) Zoledronic Acid injection (Hypercalcemia, Oncology) What is this medicine? ZOLEDRONIC ACID (ZOE le dron ik AS id) lowers the amount of calcium loss from bone. It is used to treat too much calcium in your blood from cancer. It is also used to prevent complications of cancer that has spread to the bone. This medicine may be used for other purposes; ask your health care provider or pharmacist if you have questions. COMMON BRAND NAME(S): Zometa What should I tell my health care provider before I take this medicine? They need to know if you have any of these conditions: -aspirin-sensitive asthma -cancer, especially if you are receiving medicines used to treat cancer -dental disease or wear dentures -infection -kidney disease -receiving corticosteroids like dexamethasone or prednisone -an unusual or allergic reaction to zoledronic acid, other medicines, foods, dyes, or preservatives -pregnant or trying to get pregnant -breast-feeding How should I use this medicine? This medicine is for infusion into a vein. It is given by a health care professional in a hospital or clinic setting. Talk to your pediatrician regarding the use of this medicine in children. Special care may be needed. Overdosage: If you think you have taken too much of this medicine contact a poison control  center or emergency room at once. NOTE: This medicine is only for you. Do not share this medicine with others. What if I miss a dose? It is important not to miss your dose. Call your doctor or health care professional if you are unable to keep an appointment. What may interact with this medicine? -certain antibiotics given by injection -NSAIDs, medicines for pain and inflammation, like ibuprofen or naproxen -some diuretics like bumetanide, furosemide -teriparatide -thalidomide This list may not describe all possible interactions. Give your health care provider a list of all the medicines, herbs, non-prescription drugs, or dietary supplements you use. Also tell them if you smoke, drink alcohol, or use illegal drugs. Some items may interact with your medicine. What should I watch for while using this medicine? Visit your doctor or health care professional for regular checkups. It may be some time before you see the benefit from this medicine. Do not stop taking your medicine unless your doctor tells you to. Your doctor may order blood tests or other tests to see how you are doing. Women should inform their doctor if they wish to become pregnant or think they might be pregnant. There is a potential for serious side effects to an unborn child. Talk to your health care professional or pharmacist for more information. You should make sure that you get enough calcium and vitamin D while you are taking this medicine. Discuss the foods you eat and the vitamins you take with your health care professional. Some people who take this medicine have severe bone, joint, and/or muscle pain. This medicine may also increase your risk for jaw problems or a broken thigh bone. Tell your doctor right away if you have severe pain in your jaw, bones, joints, or muscles. Tell your doctor if you have any pain that does not go away or that gets worse. Tell your dentist and dental surgeon that  you are taking this medicine. You  should not have major dental surgery while on this medicine. See your dentist to have a dental exam and fix any dental problems before starting this medicine. Take good care of your teeth while on this medicine. Make sure you see your dentist for regular follow-up appointments. What side effects may I notice from receiving this medicine? Side effects that you should report to your doctor or health care professional as soon as possible: -allergic reactions like skin rash, itching or hives, swelling of the face, lips, or tongue -anxiety, confusion, or depression -breathing problems -changes in vision -eye pain -feeling faint or lightheaded, falls -jaw pain, especially after dental work -mouth sores -muscle cramps, stiffness, or weakness -trouble passing urine or change in the amount of urine Side effects that usually do not require medical attention (report to your doctor or health care professional if they continue or are bothersome): -bone, joint, or muscle pain -constipation -diarrhea -fever -hair loss -irritation at site where injected -loss of appetite -nausea, vomiting -stomach upset -trouble sleeping -trouble swallowing -weak or tired This list may not describe all possible side effects. Call your doctor for medical advice about side effects. You may report side effects to FDA at 1-800-FDA-1088. Where should I keep my medicine? This drug is given in a hospital or clinic and will not be stored at home. NOTE: This sheet is a summary. It may not cover all possible information. If you have questions about this medicine, talk to your doctor, pharmacist, or health care provider.  2015, Elsevier/Gold Standard. (2012-07-01 13:03:13) Ondansetron tablets What is this medicine? ONDANSETRON (on DAN se tron) is used to treat nausea and vomiting caused by chemotherapy. It is also used to prevent or treat nausea and vomiting after surgery. This medicine may be used for other purposes; ask  your health care provider or pharmacist if you have questions. COMMON BRAND NAME(S): Zofran What should I tell my health care provider before I take this medicine? They need to know if you have any of these conditions: -heart disease -history of irregular heartbeat -liver disease -low levels of magnesium or potassium in the blood -an unusual or allergic reaction to ondansetron, granisetron, other medicines, foods, dyes, or preservatives -pregnant or trying to get pregnant -breast-feeding How should I use this medicine? Take this medicine by mouth with a glass of water. Follow the directions on your prescription label. Take your doses at regular intervals. Do not take your medicine more often than directed. Talk to your pediatrician regarding the use of this medicine in children. Special care may be needed. Overdosage: If you think you have taken too much of this medicine contact a poison control center or emergency room at once. NOTE: This medicine is only for you. Do not share this medicine with others. What if I miss a dose? If you miss a dose, take it as soon as you can. If it is almost time for your next dose, take only that dose. Do not take double or extra doses. What may interact with this medicine? Do not take this medicine with any of the following medications: -apomorphine -certain medicines for fungal infections like fluconazole, itraconazole, ketoconazole, posaconazole, voriconazole -cisapride -dofetilide -dronedarone -pimozide -thioridazine -ziprasidone This medicine may also interact with the following medications: -carbamazepine -certain medicines for depression, anxiety, or psychotic disturbances -fentanyl -linezolid -MAOIs like Carbex, Eldepryl, Marplan, Nardil, and Parnate -methylene blue (injected into a vein) -other medicines that prolong the QT interval (cause an abnormal heart  rhythm) -phenytoin -rifampicin -tramadol This list may not describe all possible  interactions. Give your health care provider a list of all the medicines, herbs, non-prescription drugs, or dietary supplements you use. Also tell them if you smoke, drink alcohol, or use illegal drugs. Some items may interact with your medicine. What should I watch for while using this medicine? Check with your doctor or health care professional right away if you have any sign of an allergic reaction. What side effects may I notice from receiving this medicine? Side effects that you should report to your doctor or health care professional as soon as possible: -allergic reactions like skin rash, itching or hives, swelling of the face, lips or tongue -breathing problems -confusion -dizziness -fast or irregular heartbeat -feeling faint or lightheaded, falls -fever and chills -loss of balance or coordination -seizures -sweating -swelling of the hands or feet -tightness in the chest -tremors -unusually weak or tired Side effects that usually do not require medical attention (report to your doctor or health care professional if they continue or are bothersome): -constipation or diarrhea -headache This list may not describe all possible side effects. Call your doctor for medical advice about side effects. You may report side effects to FDA at 1-800-FDA-1088. Where should I keep my medicine? Keep out of the reach of children. Store between 2 and 30 degrees C (36 and 86 degrees F). Throw away any unused medicine after the expiration date. NOTE: This sheet is a summary. It may not cover all possible information. If you have questions about this medicine, talk to your doctor, pharmacist, or health care provider.  2015, Elsevier/Gold Standard. (2012-10-27 16:27:45) Acyclovir tablets or capsules What is this medicine? ACYCLOVIR (ay SYE kloe veer) is an antiviral medicine. It is used to treat or prevent infections caused by certain kinds of viruses. Examples of these infections include herpes and  shingles. This medicine will not cure herpes. This medicine may be used for other purposes; ask your health care provider or pharmacist if you have questions. COMMON BRAND NAME(S): Zovirax What should I tell my health care provider before I take this medicine? They need to know if you have any of these conditions: -kidney disease -an unusual or allergic reaction to acyclovir, ganciclovir, valacyclovir, other medicines, foods, dyes, or preservatives -pregnant or trying to get pregnant -breast-feeding How should I use this medicine? Take this medicine by mouth with a glass of water. Follow the directions on the prescription label. You can take it with or without food. Take your medicine at regular intervals. Do not take your medicine more often than directed. Take all of your medicine as directed even if you think your are better. Do not skip doses or stop your medicine early. Talk to your pediatrician regarding the use of this medicine in children. While this drug may be prescribed for selected conditions, precautions do apply. Overdosage: If you think you have taken too much of this medicine contact a poison control center or emergency room at once. NOTE: This medicine is only for you. Do not share this medicine with others. What if I miss a dose? If you miss a dose, take it as soon as you can. If it is almost time for your next dose, take only that dose. Do not take double or extra doses. What may interact with this medicine? -probenecid This list may not describe all possible interactions. Give your health care provider a list of all the medicines, herbs, non-prescription drugs, or dietary supplements you use.  Also tell them if you smoke, drink alcohol, or use illegal drugs. Some items may interact with your medicine. What should I watch for while using this medicine? Tell your doctor or health care professional if your symptoms do not improve. This medicine works best when started very early  in the course of an infection. Begin treatment at the first signs of infection. Drink 6 to 8 glasses of water or fluids every day while you are taking this medicine. This will help prevent side effects. You can still pass chickenpox, shingles, or herpes to another person even while you are taking this medicine. Avoid contact with others as directed. Genital herpes is a sexually transmitted disease. Talk to your doctor about how to stop the spread of infection. What side effects may I notice from receiving this medicine? Side effects that you should report to your doctor or health care professional as soon as possible: -allergic reactions like skin rash, itching or hives, swelling of the face, lips, or tongue -chest pain -confusion, hallucinations, tremor -dark urine -increased sensitivity to the sun -redness, blistering, peeling or loosening of the skin, including inside the mouth -seizures -trouble passing urine or change in the amount of urine -unusual bleeding or bruising, or pinpoint red spots on the skin -unusually weak or tired -yellowing of the eyes or skin Side effects that usually do not require medical attention (report to your doctor or health care professional if they continue or are bothersome): -diarrhea -fever -headache -nausea, vomiting -stomach upset This list may not describe all possible side effects. Call your doctor for medical advice about side effects. You may report side effects to FDA at 1-800-FDA-1088. Where should I keep my medicine? Keep out of the reach of children. Store at room temperature between 15 and 25 degrees C (59 and 77 degrees F). Throw away any unused medicine after the expiration date. NOTE: This sheet is a summary. It may not cover all possible information. If you have questions about this medicine, talk to your doctor, pharmacist, or health care provider.  2015, Elsevier/Gold Standard. (2007-04-07 13:15:46) Aspirin, ASA oral tablets What is  this medicine? ASPIRIN (AS pir in) is a pain reliever. It is used to treat mild pain and fever. This medicine is also used as directed by a doctor to prevent and to treat heart attacks, to prevent strokes, and to treat arthritis or inflammation. This medicine may be used for other purposes; ask your health care provider or pharmacist if you have questions. COMMON BRAND NAME(S): Aspir-Low, Aspir-Trin, Aspirtab, Bayer Advanced Aspirin, Bayer Aspirin, Bayer Aspirin Extra Strength, Bayer Aspirin Plus, Nature conservation officer, Nature conservation officer Plus, Bayer Genuine Aspirin, Bayer Womens Aspirin, Bufferin, Bufferin Extra Strength, Bufferin Low Dose What should I tell my health care provider before I take this medicine? They need to know if you have any of these conditions: -anemia -asthma -bleeding problems -child with chickenpox, the flu, or other viral infection -diabetes -gout -if you frequently drink alcohol containing drinks -kidney disease -liver disease -low level of vitamin K -lupus -smoke tobacco -stomach ulcers or other problems -an unusual or allergic reaction to aspirin, tartrazine dye, other medicines, dyes, or preservatives -pregnant or trying to get pregnant -breast-feeding How should I use this medicine? Take this medicine by mouth with a glass of water. Follow the directions on the package or prescription label. You can take this medicine with or without food. If it upsets your stomach, take it with food. Do not take your medicine more often  than directed. Talk to your pediatrician regarding the use of this medicine in children. While this drug may be prescribed for children as young as 43 years of age for selected conditions, precautions do apply. Children and teenagers should not use this medicine to treat chicken pox or flu symptoms unless directed by a doctor. Patients over 41 years old may have a stronger reaction and need a smaller dose. Overdosage: If you think you have  taken too much of this medicine contact a poison control center or emergency room at once. NOTE: This medicine is only for you. Do not share this medicine with others. What if I miss a dose? If you are taking this medicine on a regular schedule and miss a dose, take it as soon as you can. If it is almost time for your next dose, take only that dose. Do not take double or extra doses. What may interact with this medicine? Do not take this medicine with any of the following medications: -cidofovir -ketorolac -probenecid This medicine may also interact with the following medications: -alcohol -alendronate -bismuth subsalicylate -flavocoxid -herbal supplements like feverfew, garlic, ginger, ginkgo biloba, horse chestnut -medicines for diabetes or glaucoma like acetazolamide, methazolamide -medicines for gout -medicines that treat or prevent blood clots like enoxaparin, heparin, ticlopidine, warfarin -other aspirin and aspirin-like medicines -NSAIDs, medicines for pain and inflammation, like ibuprofen or naproxen -pemetrexed -sulfinpyrazone -varicella live vaccine This list may not describe all possible interactions. Give your health care provider a list of all the medicines, herbs, non-prescription drugs, or dietary supplements you use. Also tell them if you smoke, drink alcohol, or use illegal drugs. Some items may interact with your medicine. What should I watch for while using this medicine? If you are treating yourself for pain, tell your doctor or health care professional if the pain lasts more than 10 days, if it gets worse, or if there is a new or different kind of pain. Tell your doctor if you see redness or swelling. Also, check with your doctor if you have a fever that lasts for more than 3 days. Only take this medicine to prevent heart attacks or blood clotting if prescribed by your doctor or health care professional. Do not take aspirin or aspirin-like medicines with this medicine.  Too much aspirin can be dangerous. Always read the labels carefully. This medicine can irritate your stomach or cause bleeding problems. Do not smoke cigarettes or drink alcohol while taking this medicine. Do not lie down for 30 minutes after taking this medicine to prevent irritation to your throat. If you are scheduled for any medical or dental procedure, tell your healthcare provider that you are taking this medicine. You may need to stop taking this medicine before the procedure. This medicine may be used to treat migraines. If you take migraine medicines for 10 or more days a month, your migraines may get worse. Keep a diary of headache days and medicine use. Contact your healthcare professional if your migraine attacks occur more frequently. What side effects may I notice from receiving this medicine? Side effects that you should report to your doctor or health care professional as soon as possible: -allergic reactions like skin rash, itching or hives, swelling of the face, lips, or tongue -breathing problems -changes in hearing, ringing in the ears -confusion -general ill feeling or flu-like symptoms -pain on swallowing -redness, blistering, peeling or loosening of the skin, including inside the mouth or nose -signs and symptoms of bleeding such as bloody or black,  tarry stools; red or dark-brown urine; spitting up blood or brown material that looks like coffee grounds; red spots on the skin; unusual bruising or bleeding from the eye, gums, or nose -trouble passing urine or change in the amount of urine -unusually weak or tired -yellowing of the eyes or skin Side effects that usually do not require medical attention (report to your doctor or health care professional if they continue or are bothersome): -diarrhea or constipation -headache -nausea, vomiting -stomach gas, heartburn This list may not describe all possible side effects. Call your doctor for medical advice about side effects.  You may report side effects to FDA at 1-800-FDA-1088. Where should I keep my medicine? Keep out of the reach of children. Store at room temperature between 15 and 30 degrees C (59 and 86 degrees F). Protect from heat and moisture. Do not use this medicine if it has a strong vinegar smell. Throw away any unused medicine after the expiration date. NOTE: This sheet is a summary. It may not cover all possible information. If you have questions about this medicine, talk to your doctor, pharmacist, or health care provider.  2015, Elsevier/Gold Standard. (2012-09-21 11:30:31)

## 2014-03-10 ENCOUNTER — Encounter (HOSPITAL_BASED_OUTPATIENT_CLINIC_OR_DEPARTMENT_OTHER): Payer: Medicare Other

## 2014-03-10 ENCOUNTER — Ambulatory Visit: Payer: Medicare Other

## 2014-03-10 DIAGNOSIS — C9000 Multiple myeloma not having achieved remission: Secondary | ICD-10-CM

## 2014-03-10 MED ORDER — ONDANSETRON HCL 8 MG PO TABS: 8.0000 mg | ORAL_TABLET | Freq: Three times a day (TID) | ORAL | Status: DC | PRN

## 2014-03-10 NOTE — Progress Notes (Signed)
Chemo teaching done and consent signed for Revlimid, Velcade, Dexamethasone, & Zofran. Scripts called into [facility]. Zofran called into [facility] because it is cheaper through them. Distress screening done. Med calendar given to patient. Patient's wife and daughter present for teaching. Patient's daughter given a teaching packet also regarding medications & a calendar. Tammy got [employer] prior auth # and faxed script and auth # to [facility] this morning.

## 2014-03-10 NOTE — Addendum Note (Signed)
Addended by: Gerhard Perches on: 03/10/2014 05:06 PM   Modules accepted: Orders

## 2014-03-13 ENCOUNTER — Other Ambulatory Visit (HOSPITAL_COMMUNITY): Payer: Medicare Other

## 2014-03-14 ENCOUNTER — Encounter: Payer: Self-pay | Admitting: *Deleted

## 2014-03-14 NOTE — Progress Notes (Signed)
[place] Psychosocial Distress Screening Clinical Social Work  Clinical Social Work was referred by distress screening protocol.  The patient scored a 5 on the Psychosocial Distress Thermometer which indicates moderate distress. Clinical Social Worker phoned pt to assess for distress and other psychosocial needs. Pt reports to be doing better as he has more of a plan in place for his treatment currently. He felt his physical concerns were addressed at his appt by MD. He is most concerned with starting treatment later this week. CSW reviewed role of CSW and how CSW can assist pt as he begins treatment. Pt aware how to contact CSW as needed. Pt expressed concerns about getting some medicine in the mail and CSW shared concern with Rosalio Macadamia, RN who will make sure pt is given number to call re. Medicine. No other needs or concerns currently.  ONCBCN DISTRESS SCREENING 03/10/2014  Screening Type Initial Screening  Distress experienced in past week (1-10) 5  Emotional problem type Nervousness/Anxiety;Adjusting to illness;Boredom  Information Concerns Type   Physical Problem type Pain;Sleep/insomnia;Getting around;Loss of appetitie;Constipation/diarrhea;Tingling hands/feet  Physician notified of physical symptoms (No Data)  Referral to clinical social work Yes    Clinical Social Worker follow up needed: No.  If yes, follow up plan:  Loren Racer, [place]  [facility] Phone: [phone] Fax: [phone]

## 2014-03-16 ENCOUNTER — Other Ambulatory Visit (HOSPITAL_COMMUNITY): Payer: Self-pay | Admitting: Hematology & Oncology

## 2014-03-16 ENCOUNTER — Other Ambulatory Visit (HOSPITAL_COMMUNITY): Payer: Self-pay | Admitting: Oncology

## 2014-03-16 ENCOUNTER — Ambulatory Visit (HOSPITAL_COMMUNITY)
Admission: RE | Admit: 2014-03-16 | Discharge: 2014-03-16 | Disposition: A | Discharge status: Home / Self Care | Payer: Medicare Other | Source: Ambulatory Visit | Attending: Hematology & Oncology | Admitting: Hematology & Oncology

## 2014-03-16 ENCOUNTER — Encounter (HOSPITAL_COMMUNITY): Payer: Self-pay | Admitting: Oncology

## 2014-03-16 ENCOUNTER — Encounter (HOSPITAL_COMMUNITY): Payer: Self-pay

## 2014-03-16 DIAGNOSIS — N400 Enlarged prostate without lower urinary tract symptoms: Secondary | ICD-10-CM | POA: Insufficient documentation

## 2014-03-16 DIAGNOSIS — K5730 Diverticulosis of large intestine without perforation or abscess without bleeding: Secondary | ICD-10-CM | POA: Insufficient documentation

## 2014-03-16 DIAGNOSIS — Z79899 Other long term (current) drug therapy: Secondary | ICD-10-CM | POA: Diagnosis not present

## 2014-03-16 DIAGNOSIS — M899 Disorder of bone, unspecified: Secondary | ICD-10-CM | POA: Insufficient documentation

## 2014-03-16 DIAGNOSIS — I700 Atherosclerosis of aorta: Secondary | ICD-10-CM | POA: Insufficient documentation

## 2014-03-16 DIAGNOSIS — C9000 Multiple myeloma not having achieved remission: Secondary | ICD-10-CM | POA: Diagnosis not present

## 2014-03-16 LAB — GLUCOSE, CAPILLARY: Glucose-Capillary: 102 mg/dL — ABNORMAL HIGH (ref 70–99)

## 2014-03-16 MED ORDER — FLUDEOXYGLUCOSE F - 18 (FDG) INJECTION
12.0200 | Freq: Once | INTRAVENOUS | Status: AC | PRN
  Administered 2014-03-16: 12.02 via INTRAVENOUS

## 2014-03-17 ENCOUNTER — Other Ambulatory Visit (HOSPITAL_COMMUNITY): Payer: Medicare Other

## 2014-03-17 ENCOUNTER — Encounter (HOSPITAL_COMMUNITY): Payer: Self-pay

## 2014-03-17 ENCOUNTER — Telehealth (HOSPITAL_COMMUNITY): Payer: Self-pay | Admitting: *Deleted

## 2014-03-17 ENCOUNTER — Encounter (HOSPITAL_BASED_OUTPATIENT_CLINIC_OR_DEPARTMENT_OTHER): Payer: Medicare Other

## 2014-03-17 ENCOUNTER — Encounter (HOSPITAL_COMMUNITY): Payer: Self-pay | Admitting: Hematology & Oncology

## 2014-03-17 ENCOUNTER — Ambulatory Visit (HOSPITAL_COMMUNITY): Payer: Medicare Other | Admitting: Hematology & Oncology

## 2014-03-17 ENCOUNTER — Encounter (HOSPITAL_BASED_OUTPATIENT_CLINIC_OR_DEPARTMENT_OTHER): Payer: Medicare Other | Admitting: Hematology & Oncology

## 2014-03-17 ENCOUNTER — Other Ambulatory Visit (HOSPITAL_COMMUNITY): Payer: Self-pay | Admitting: Hematology & Oncology

## 2014-03-17 VITALS — BP 132/76 | HR 69 | Temp 98.1°F | Resp 16 | Wt 237.8 lb

## 2014-03-17 DIAGNOSIS — C9000 Multiple myeloma not having achieved remission: Secondary | ICD-10-CM

## 2014-03-17 DIAGNOSIS — R630 Anorexia: Secondary | ICD-10-CM | POA: Diagnosis not present

## 2014-03-17 DIAGNOSIS — N400 Enlarged prostate without lower urinary tract symptoms: Secondary | ICD-10-CM | POA: Diagnosis not present

## 2014-03-17 DIAGNOSIS — R634 Abnormal weight loss: Secondary | ICD-10-CM | POA: Diagnosis not present

## 2014-03-17 DIAGNOSIS — M899 Disorder of bone, unspecified: Secondary | ICD-10-CM | POA: Diagnosis not present

## 2014-03-17 DIAGNOSIS — K219 Gastro-esophageal reflux disease without esophagitis: Secondary | ICD-10-CM | POA: Diagnosis not present

## 2014-03-17 DIAGNOSIS — Z923 Personal history of irradiation: Secondary | ICD-10-CM | POA: Diagnosis not present

## 2014-03-17 DIAGNOSIS — M542 Cervicalgia: Secondary | ICD-10-CM | POA: Diagnosis not present

## 2014-03-17 DIAGNOSIS — Z5112 Encounter for antineoplastic immunotherapy: Secondary | ICD-10-CM

## 2014-03-17 DIAGNOSIS — D472 Monoclonal gammopathy: Secondary | ICD-10-CM | POA: Diagnosis not present

## 2014-03-17 LAB — SEDIMENTATION RATE: Sed Rate: 5 mm/hr (ref 0–16)

## 2014-03-17 LAB — URIC ACID: Uric Acid, Serum: 4.8 mg/dL (ref 4.0–7.8)

## 2014-03-17 LAB — COMPREHENSIVE METABOLIC PANEL
ALT: 10 U/L (ref 0–53)
AST: 14 U/L (ref 0–37)
Albumin: 3.4 g/dL — ABNORMAL LOW (ref 3.5–5.2)
Alkaline Phosphatase: 28 U/L — ABNORMAL LOW (ref 39–117)
Anion gap: 3 — ABNORMAL LOW (ref 5–15)
BUN: 19 mg/dL (ref 6–23)
CO2: 24 mmol/L (ref 19–32)
Calcium: 8.5 mg/dL (ref 8.4–10.5)
Chloride: 110 mmol/L (ref 96–112)
Creatinine, Ser: 0.68 mg/dL (ref 0.50–1.35)
GFR calc Af Amer: >90 mL/min (ref >90)
GFR calc non Af Amer: >90 mL/min (ref >90)
Glucose, Bld: 110 mg/dL — ABNORMAL HIGH (ref 70–99)
Potassium: 4.1 mmol/L (ref 3.5–5.1)
Sodium: 137 mmol/L (ref 135–145)
Total Bilirubin: 0.4 mg/dL (ref 0.3–1.2)
Total Protein: 6.8 g/dL (ref 6.0–8.3)

## 2014-03-17 LAB — CBC WITH DIFFERENTIAL/PLATELET
Basophils Absolute: 0 10*3/uL (ref 0.0–0.1)
Basophils Relative: 0 % (ref 0–1)
Eosinophils Absolute: 0.1 10*3/uL (ref 0.0–0.7)
Eosinophils Relative: 1 % (ref 0–5)
HCT: 42.2 % (ref 39.0–52.0)
Hemoglobin: 14.1 g/dL (ref 13.0–17.0)
Lymphocytes Relative: 24 % (ref 12–46)
Lymphs Abs: 1.4 10*3/uL (ref 0.7–4.0)
MCH: 29.3 pg (ref 26.0–34.0)
MCHC: 33.4 g/dL (ref 30.0–36.0)
MCV: 87.7 fL (ref 78.0–100.0)
Monocytes Absolute: 0.4 10*3/uL (ref 0.1–1.0)
Monocytes Relative: 7 % (ref 3–12)
Neutro Abs: 4 10*3/uL (ref 1.7–7.7)
Neutrophils Relative %: 68 % (ref 43–77)
Platelets: 156 10*3/uL (ref 150–400)
RBC: 4.81 MIL/uL (ref 4.22–5.81)
RDW: 14 % (ref 11.5–15.5)
WBC: 5.8 10*3/uL (ref 4.0–10.5)

## 2014-03-17 LAB — LACTATE DEHYDROGENASE: LDH: 111 U/L (ref 94–250)

## 2014-03-17 MED ORDER — BORTEZOMIB CHEMO SQ INJECTION 3.5 MG (2.5MG/ML)
1.3000 mg/m2 | Freq: Once | INTRAMUSCULAR | Status: AC
  Administered 2014-03-17: 3 mg via SUBCUTANEOUS
  Filled 2014-03-17: qty 3

## 2014-03-17 MED ORDER — ONDANSETRON HCL 4 MG PO TABS
8.0000 mg | ORAL_TABLET | Freq: Once | ORAL | Status: AC
  Administered 2014-03-17: 8 mg via ORAL
  Filled 2014-03-17: qty 2

## 2014-03-17 MED ORDER — ZOLEDRONIC ACID 4 MG/5ML IV CONC
4.0000 mg | Freq: Once | INTRAVENOUS | Status: AC
  Administered 2014-03-17: 4 mg via INTRAVENOUS
  Filled 2014-03-17: qty 5

## 2014-03-17 MED ORDER — SODIUM CHLORIDE 0.9 % IV SOLN
Freq: Once | INTRAVENOUS | Status: AC
  Administered 2014-03-17: 10:00:00 via INTRAVENOUS

## 2014-03-17 NOTE — Progress Notes (Signed)
LABS DRAWN

## 2014-03-17 NOTE — Telephone Encounter (Signed)
Talked with patient to let him know that we will call him Monday with appt for MRI. Info sent to scheduler

## 2014-03-17 NOTE — Progress Notes (Signed)
Dean [name]'s reason for visit today is for an injection.  Dean [name] also received velcade in right abdomen sq per MD orders; see [facility] for administration details.  Dean [name] tolerated all procedures well and without incident; questions were answered and patient was discharged.  Tolerated chemotherapy injection and zometa infusion well today.  Discharged ambulatory

## 2014-03-17 NOTE — Patient Instructions (Signed)
..  [place] at [facility] Discharge Instructions  RECOMMENDATIONS MADE BY THE CONSULTANT AND ANY TEST RESULTS WILL BE SENT TO YOUR REFERRING PHYSICIAN.  We will start zometa today and you will get that monthly. Velcade injection weekly F/U with MD 2/22   Thank you for choosing [place] at [facility] to provide your oncology and hematology care.  To afford each patient quality time with our provider, please arrive at least 15 minutes before your scheduled appointment time.    You need to re-schedule your appointment should you arrive 10 or more minutes late.  We strive to give you quality time with our providers, and arriving late affects you and other patients whose appointments are after yours.  Also, if you no show three or more times for appointments you may be dismissed from the clinic at the providers discretion.     Again, thank you for choosing [facility].  Our hope is that these requests will decrease the amount of time that you wait before being seen by our physicians.       _____________________________________________________________  Should you have questions after your visit to [facility], please contact our office at [phone] between the hours of 8:30 a.m. and 4:30 p.m.  Voicemails left after 4:30 p.m. will not be returned until the following business day.  For prescription refill requests, have your pharmacy contact our office.

## 2014-03-17 NOTE — Progress Notes (Signed)
Dean Fraction, MD [address] [place] [id number]  Left occipital condyle infiltrative lesion, not amenable to biopsy. Palliative XRT without a tissue diagnosis  Chest x-ray and right rib films were obtained which showed a soft tissue mass along the right posterior fifth rib with some rib destruction.  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 - SEE COMMENT. PERIPHERAL BLOOD: - NO SIGNIFICANT MORPHOLOGIC ABNORMALITIES. Diagnosis Note The plasma cells are increased in the marrow representing 33% of all cells in the aspirate associated with interstitial infiltrates, variably sized aggregates and large sheets in the clot and biopsy sections consistent with plasma cell neoplasm. Correlation with cytogenetic and FISH studies is recommended. For completion, immunohistochemical stains for CD138, kappa and lambda will be performed and reported in an addendum.  CURRENT THERAPY: XRT   INTERVAL HISTORY: Dean Duran79y.o. male returns for follow-up of myeloma;.he presented initially with a left occipital condyle infiltrative lesion that was not amenable to biopsy and underwent palliative radiation.  He is doing well. He has had good palliation of his pain from radiation. His daughter has been with him at his last appointment with me to discuss treatments. We are currently trying to start him on RVD. His insurance continues to deny Revlimid for unknown reasons. We are switching him to Velcade and dexamethasone until  it is approved. He is here today to start Velcade and Zometa.   MEDICAL HISTORY: Past Medical History  Diagnosis Date  . Neuropathy   . BPH (benign prostatic hyperplasia)   . GERD (gastroesophageal reflux disease)   . H/O ETOH abuse   . Skull lesion     Left occipital condyle  . History of radiation therapy 01/05/13-02/10/13    79 gray to left occipital condyle region  . Multiple myeloma     has Neuropathy; BPH (benign prostatic hyperplasia); GERD (gastroesophageal reflux disease); H/O ETOH abuse; Skull lesion; Neck pain; Fatigue; Loss of weight; Insomnia; MGUS (monoclonal gammopathy of unknown significance); Allergic rhinitis; Lumbar compression fracture; Dermatitis; and Multiple myeloma without remission on his problem list.      Multiple myeloma without remission   11/16/2012 Initial Diagnosis L occipital condyle destructive lesion, not biopsied secondary to location. XRT   12/01/2012 Imaging L3 superior endplate compression FX, no lytic lesions to suggest myeloma   02/06/2014 Imaging soft tissue mass along the right posterior fifth rib with some rib destruction   02/10/2014 - 03/07/2014 Radiation Therapy 30Gy to R rib lesion, lesion not biopsied   02/20/2014 Bone Marrow Biopsy Normal cytogenetics and myeloma FISH profile.   03/16/2014 PET scan Scattered hypermetabolic osseous lesions, including the calvarium, ribs, left scapula, sacrum, and left femoral shaft. An expansile left vertebral body lesion at L3 extends into the epidural space of the left lateral recess,     Miscellaneous Normal CBC, Normal CMP, kappa,lamda ratio of 2.62 (abnormal), UIEP with slightly restricted band, IgG kappa, SPEP/IEP with monoclonal protein at 0.79 g/dl, normal igG, suppressed IGM  is allergic to codeine and morphine and related.  Dean [name] had no medications administered during this visit.  SURGICAL HISTORY: Past Surgical History  Procedure Laterality Date  . Cyst excision  1959    tail bone     SOCIAL HISTORY: History   Social History  . Marital Status: Married    Spouse Name: N/A  . Number of Children: 3  . Years of Education: N/A   Occupational History  .     Social History Main Topics  . Smoking status: Never Smoker   . Smokeless tobacco: Never Used  . Alcohol Use: No     Comment: " Not much no more"  . Drug Use: No  . Sexual Activity: Not on file   Other Topics Concern  . Not on file   Social History Narrative    FAMILY HISTORY: Family History  Problem Relation Age of Onset  . Diabetes Mother   . Stroke Mother   . Hypertension Father     Review of Systems  Constitutional: Positive for malaise/fatigue. Negative for fever, chills and weight loss.  HENT: Negative for congestion, hearing loss, nosebleeds, sore throat and tinnitus.   Eyes: Negative for blurred vision, double vision, pain and discharge.  Respiratory: Negative for cough, hemoptysis, sputum production, shortness of breath and wheezing.   Cardiovascular: Negative for chest pain, palpitations, claudication, leg swelling and PND.  Gastrointestinal: Negative for heartburn, nausea, vomiting, abdominal pain, diarrhea, constipation, blood in stool and melena.  Genitourinary: Negative for dysuria, urgency, frequency and hematuria.  Musculoskeletal: Negative for myalgias, joint pain and falls.  Skin: Negative for itching and rash.  Neurological: Positive for weakness. Negative for dizziness, tingling, tremors, sensory change, speech change, focal weakness, seizures, loss of consciousness and headaches.  Endo/Heme/Allergies: Does not bruise/bleed easily.  Psychiatric/Behavioral: Negative for depression, suicidal ideas, memory loss and substance abuse. The patient is not nervous/anxious and does not have insomnia.     PHYSICAL EXAMINATION  ECOG PERFORMANCE STATUS: 1 - Symptomatic but completely ambulatory  Filed Vitals:   03/17/14 0918  BP: 132/76  Pulse: 69  Temp: 98.1 F (36.7 C)  Resp:  16    Physical Exam  Constitutional: He is oriented to person, place, and time and well-developed, well-nourished, and in no distress.  HENT:  Head: Normocephalic and atraumatic.  Nose: Nose normal.  Mouth/Throat: Oropharynx is clear and moist. No oropharyngeal exudate.  Eyes: Conjunctivae and EOM are normal. Pupils are equal, round, and reactive to light. Right eye exhibits no discharge. Left eye exhibits no discharge. No scleral icterus.  Neck: Normal range of motion. Neck supple. No tracheal deviation present. No thyromegaly present.  Cardiovascular: Normal rate, regular rhythm and normal heart sounds.  Exam reveals no gallop and no friction rub.   No murmur heard. Pulmonary/Chest: Effort normal and breath sounds normal. He has no wheezes. He has no rales.  Abdominal: Soft. Bowel sounds are normal. He exhibits no distension and no mass. There is no tenderness. There is no rebound and no guarding.  Musculoskeletal: Normal range of motion. He exhibits no edema.  Lymphadenopathy:    He has no cervical adenopathy.  Neurological: He is alert and oriented to person, place, and time. He has normal reflexes. No cranial nerve deficit. Gait normal. Coordination normal.  Skin: Skin is warm and dry. No rash noted.  Psychiatric: Mood, memory, affect and judgment normal.  Nursing note and vitals reviewed.   LABORATORY DATA:  CBC    Component Value Date/Time  WBC 5.8 03/17/2014 0907   RBC 4.81 03/17/2014 0907   HGB 14.1 03/17/2014 0907   HCT 42.2 03/17/2014 0907   PLT 156 03/17/2014 0907   MCV 87.7 03/17/2014 0907   MCH 29.3 03/17/2014 0907   MCHC 33.4 03/17/2014 0907   RDW 14.0 03/17/2014 0907   LYMPHSABS 1.4 03/17/2014 0907   MONOABS 0.4 03/17/2014 0907   EOSABS 0.1 03/17/2014 0907   BASOSABS 0.0 03/17/2014 0907   CMP     Component Value Date/Time   NA 137 03/17/2014 0907   K 4.1 03/17/2014 0907   CL 110 03/17/2014 0907   CO2 24 03/17/2014 0907   GLUCOSE 110* 03/17/2014 0907    BUN 19 03/17/2014 0907   CREATININE 0.68 03/17/2014 0907   CREATININE 0.76 02/06/2014 1249   CALCIUM 8.5 03/17/2014 0907   PROT 6.8 03/17/2014 0907   PROT PENDING 03/17/2014 0907   ALBUMIN 3.4* 03/17/2014 0907   AST 14 03/17/2014 0907   ALT 10 03/17/2014 0907   ALKPHOS 28* 03/17/2014 0907   BILITOT 0.4 03/17/2014 0907   GFRNONAA >90 03/17/2014 0907   GFRNONAA 89 02/06/2014 1249   GFRAA >90 03/17/2014 0907   GFRAA >89 02/06/2014 1249      RADIOGRAPHIC STUDIES: CLINICAL DATA: Initial treatment strategy for multiple myeloma.  EXAM: NUCLEAR MEDICINE PET WHOLE BODY  TECHNIQUE: Twelve mCi F-18 FDG was injected intravenously. Full-ring PET imaging was performed from the vertex to the feet after the radiotracer. CT data was obtained and used for attenuation correction and anatomic localization.  FASTING BLOOD GLUCOSE: Value: One hundred two mg/dl  COMPARISON: Multiple exams, including 02/20/2014 and 11/30/2012  RADIOPHARMACEUTICALS: Twelve mCi Technetium-99 MDP  FINDINGS: Head/Neck: 1.1 cm lytic lesion in the right frontotemporal bone region, probably with faintly increased metabolic activity, but not readily measure due to the proximity of the adjacent high a brain activity.  Chest: There is an unusual degree of reduced activity in the left ventricular myocardial apex and adjacent septum, enough to raise suspicion for prior myocardial infarction. Correlate with patient history.  Abdomen/Pelvis: Diffuse uptake in the descending and sigmoid colon in a region of significant diverticulosis is present, and extends down into the rectum. Given the diffuse nature of this activity, and is ascribed to physiologic etiology.  Enlarged prostate gland. Small umbilical hernia contains adipose tissue. Aortoiliac atherosclerotic vascular disease.  Skeleton: A variety of hypermetabolic bony lesions are observed compatible with myeloma.  Among these are a right  second rib lesion with maximum standard uptake value 4.2; a left posterior glenoid lesion with maximum standard uptake value 4.1; an expansile right fifth rib primarily lytic lesion with maximum standard uptake value 3.0 ; a left L3 lytic lesion measuring 2.7 cm in diameter on image 168 of series 3 with maximum standard uptake value of 9.1 and with epidural extension in the left anterior lateral recess ; an expansile right sacral lesion with maximum standard uptake value 8.1 ; a left lower lytic sacral lesion with maximum standard uptake value 8.3 ; and a left mid femoral shaft lesion with maximum standard uptake value 2.7.  Several other smaller scattered lesions are present, such as the small lytic lesion in the left lateral clavicle on image 70 of series 3.  IMPRESSION: 1. Scattered hypermetabolic osseous lesions, including the calvarium, ribs, left scapula, sacrum, and left femoral shaft. An expansile left vertebral body lesion at L3 extends into the epidural space of the left lateral recess, and probably merits lumbar spine MRI assessment. 2. Reduced  activity in the left ventricular myocardial apex and adjacent septum, suspicious for prior myocardial infarction. 3. Physiologic activity in the distal colon. Descending and sigmoid colon diverticulosis. 4. Aortoiliac atherosclerotic vascular disease. 5. Enlarged prostate gland.   Electronically Signed  By: Van Clines M.D.  On: 03/16/2014 14:56  ASSESSMENT and THERAPY PLAN:    Multiple myeloma without remission Pleasant 79 year old male with standard risk multiple myeloma. He has evidence of several plasmacytomas and has received palliative radiation. He has normal CBC and CMP. His M spike in peripheral blood is small. Bone marrow biopsy showed kappa restricted asthma cells at 33-40% of his marrow.  As opted to proceed with therapy which will include Zometa given his extensive bone disease. Interestingly myeloma  survey has not been particularly helpful in diagnosing his bone disease but had imaging has been very forthcoming. I reviewed the results of his PET scan with him today. We will arrange for an MRI of the lumbar spine based on the abnormal findings at L3 on PET imaging.. Myeloma survey revealed a compression fracture at L3.  He has a very good performance status and even though he is 55. Consider a referral to [facility] to discuss transplant. He has normal risk myeloma and RVD is the preferred initial regimen. Currently his insurance continues to deny Revlimid. We will start him on Velcade and dexamethasone today and Zometa. I will see him back prior to day 11 of treatment to assess his tolerance and address any additional questions or concerns.     All questions were answered. The patient knows to call the clinic with any problems, questions or concerns. We can certainly see the patient much sooner if necessary. Molli Hazard 03/19/2014

## 2014-03-17 NOTE — Patient Instructions (Signed)
[facility] Discharge Instructions for Patients Receiving Chemotherapy  Today you received the following chemotherapy agents velcade, zometa Call the clinic if you have any questions or concerns, follow up as scheduled  To help prevent nausea and vomiting after your treatment, we encourage you to take your nausea medication  If you develop nausea and vomiting that is not controlled by your nausea medication, call the clinic. If it is after clinic hours your family physician or the after hours number for the clinic or go to the Emergency Department.   BELOW ARE SYMPTOMS THAT SHOULD BE REPORTED IMMEDIATELY:  *FEVER GREATER THAN 101.0 F  *CHILLS WITH OR WITHOUT FEVER  NAUSEA AND VOMITING THAT IS NOT CONTROLLED WITH YOUR NAUSEA MEDICATION  *UNUSUAL SHORTNESS OF BREATH  *UNUSUAL BRUISING OR BLEEDING  TENDERNESS IN MOUTH AND THROAT WITH OR WITHOUT PRESENCE OF ULCERS  *URINARY PROBLEMS  *BOWEL PROBLEMS  UNUSUAL RASH Items with * indicate a potential emergency and should be followed up as soon as possible.  One of the nurses will contact you 24 hours after your treatment. Please let the nurse know about any problems that you may have experienced. Feel free to call the clinic you have any questions or concerns. The clinic phone number is [phone].   I have been informed and understand all the instructions given to me. I know to contact the clinic, my physician, or go to the Emergency Department if any problems should occur. I do not have any questions at this time, but understand that I may call the clinic during office hours or the Patient Navigator at [phone] should I have any questions or need assistance in obtaining follow up care.

## 2014-03-18 LAB — BETA 2 MICROGLOBULIN, SERUM: Beta-2 Microglobulin: 0.5 mg/L — ABNORMAL LOW (ref 0.6–2.4)

## 2014-03-19 LAB — KAPPA/LAMBDA LIGHT CHAINS
Kappa free light chain: 64.26 mg/L — ABNORMAL HIGH (ref 3.30–19.40)
Kappa, lambda light chain ratio: 3.26 — ABNORMAL HIGH (ref 0.26–1.65)
Lambda free light chains: 19.74 mg/L (ref 5.71–26.30)

## 2014-03-19 NOTE — Assessment & Plan Note (Signed)
Pleasant 79 year old male with standard risk multiple myeloma. He has evidence of several plasmacytomas and has received palliative radiation. He has normal CBC and CMP. His M spike in peripheral blood is small. Bone marrow biopsy showed kappa restricted asthma cells at 33-40% of his marrow.  As opted to proceed with therapy which will include Zometa given his extensive bone disease. Interestingly myeloma survey has not been particularly helpful in diagnosing his bone disease but had imaging has been very forthcoming. I reviewed the results of his PET scan with him today. We will arrange for an MRI of the lumbar spine based on the abnormal findings at L3 on PET imaging.. Myeloma survey revealed a compression fracture at L3.  He has a very good performance status and even though he is 31. Consider a referral to [facility] to discuss transplant. He has normal risk myeloma and RVD is the preferred initial regimen. Currently his insurance continues to deny Revlimid. We will start him on Velcade and dexamethasone today and Zometa. I will see him back prior to day 11 of treatment to assess his tolerance and address any additional questions or concerns.

## 2014-03-20 ENCOUNTER — Encounter (HOSPITAL_COMMUNITY): Payer: Self-pay

## 2014-03-20 ENCOUNTER — Encounter (HOSPITAL_BASED_OUTPATIENT_CLINIC_OR_DEPARTMENT_OTHER): Payer: Medicare Other

## 2014-03-20 VITALS — BP 131/65 | HR 94 | Temp 97.6°F | Resp 18 | Wt 239.4 lb

## 2014-03-20 DIAGNOSIS — C9000 Multiple myeloma not having achieved remission: Secondary | ICD-10-CM

## 2014-03-20 DIAGNOSIS — Z5112 Encounter for antineoplastic immunotherapy: Secondary | ICD-10-CM | POA: Diagnosis not present

## 2014-03-20 DIAGNOSIS — M545 Low back pain: Secondary | ICD-10-CM

## 2014-03-20 MED ORDER — BORTEZOMIB CHEMO SQ INJECTION 3.5 MG (2.5MG/ML)
1.3000 mg/m2 | Freq: Once | INTRAMUSCULAR | Status: AC
  Administered 2014-03-20: 3 mg via SUBCUTANEOUS
  Filled 2014-03-20: qty 3

## 2014-03-20 MED ORDER — OXYCODONE-ACETAMINOPHEN 10-325 MG PO TABS: 1.0000 | ORAL_TABLET | ORAL | Status: DC | PRN

## 2014-03-20 MED ORDER — ONDANSETRON HCL 4 MG PO TABS
8.0000 mg | ORAL_TABLET | Freq: Once | ORAL | Status: AC
  Administered 2014-03-20: 8 mg via ORAL
  Filled 2014-03-20: qty 2

## 2014-03-20 MED ORDER — LANSOPRAZOLE 30 MG PO CPDR: 30.0000 mg | DELAYED_RELEASE_CAPSULE | Freq: Every day | ORAL | Status: DC

## 2014-03-20 NOTE — Patient Instructions (Signed)
[facility] Discharge Instructions for Patients Receiving Chemotherapy  Today you received the following chemotherapy agents velcade Follow up as scheduled, call the clinic if you have any questions or conerns  To help prevent nausea and vomiting after your treatment, we encourage you to take your nausea medication .   If you develop nausea and vomiting that is not controlled by your nausea medication, call the clinic. If it is after clinic hours your family physician or the after hours number for the clinic or go to the Emergency Department.   BELOW ARE SYMPTOMS THAT SHOULD BE REPORTED IMMEDIATELY:  *FEVER GREATER THAN 101.0 F  *CHILLS WITH OR WITHOUT FEVER  NAUSEA AND VOMITING THAT IS NOT CONTROLLED WITH YOUR NAUSEA MEDICATION  *UNUSUAL SHORTNESS OF BREATH  *UNUSUAL BRUISING OR BLEEDING  TENDERNESS IN MOUTH AND THROAT WITH OR WITHOUT PRESENCE OF ULCERS  *URINARY PROBLEMS  *BOWEL PROBLEMS  UNUSUAL RASH Items with * indicate a potential emergency and should be followed up as soon as possible.  One of the nurses will contact you 24 hours after your treatment. Please let the nurse know about any problems that you may have experienced. Feel free to call the clinic you have any questions or concerns. The clinic phone number is [phone].   I have been informed and understand all the instructions given to me. I know to contact the clinic, my physician, or go to the Emergency Department if any problems should occur. I do not have any questions at this time, but understand that I may call the clinic during office hours or the Patient Navigator at [phone] should I have any questions or need assistance in obtaining follow up care.    Bortezomib injection What is this medicine? BORTEZOMIB (bor TEZ oh mib) is a chemotherapy drug. It slows the growth of cancer cells. This medicine is used to treat multiple myeloma, and certain lymphomas, such as mantle-cell  lymphoma. This medicine may be used for other purposes; ask your health care provider or pharmacist if you have questions. COMMON BRAND NAME(S): Velcade What should I tell my health care provider before I take this medicine? They need to know if you have any of these conditions: -diabetes -heart disease -irregular heartbeat -liver disease -on hemodialysis -low blood counts, like low white blood cells, platelets, or hemoglobin -peripheral neuropathy -taking medicine for blood pressure -an unusual or allergic reaction to bortezomib, mannitol, boron, other medicines, foods, dyes, or preservatives -pregnant or trying to get pregnant -breast-feeding How should I use this medicine? This medicine is for injection into a vein or for injection under the skin. It is given by a health care professional in a hospital or clinic setting. Talk to your pediatrician regarding the use of this medicine in children. Special care may be needed. Overdosage: If you think you have taken too much of this medicine contact a poison control center or emergency room at once. NOTE: This medicine is only for you. Do not share this medicine with others. What if I miss a dose? It is important not to miss your dose. Call your doctor or health care professional if you are unable to keep an appointment. What may interact with this medicine? This medicine may interact with the following medications: -ketoconazole -rifampin -ritonavir -St. John's Wort This list may not describe all possible interactions. Give your health care provider a list of all the medicines, herbs, non-prescription drugs, or dietary supplements you use. Also tell them if you smoke, drink  alcohol, or use illegal drugs. Some items may interact with your medicine. What should I watch for while using this medicine? Visit your doctor for checks on your progress. This drug may make you feel generally unwell. This is not uncommon, as chemotherapy can affect  healthy cells as well as cancer cells. Report any side effects. Continue your course of treatment even though you feel ill unless your doctor tells you to stop. You may get drowsy or dizzy. Do not drive, use machinery, or do anything that needs mental alertness until you know how this medicine affects you. Do not stand or sit up quickly, especially if you are an older patient. This reduces the risk of dizzy or fainting spells. In some cases, you may be given additional medicines to help with side effects. Follow all directions for their use. Call your doctor or health care professional for advice if you get a fever, chills or sore throat, or other symptoms of a cold or flu. Do not treat yourself. This drug decreases your body's ability to fight infections. Try to avoid being around people who are sick. This medicine may increase your risk to bruise or bleed. Call your doctor or health care professional if you notice any unusual bleeding. You may need blood work done while you are taking this medicine. In some patients, this medicine may cause a serious brain infection that may cause death. If you have any problems seeing, thinking, speaking, walking, or standing, tell your doctor right away. If you cannot reach your doctor, urgently seek other source of medical care. Do not become pregnant while taking this medicine. Women should inform their doctor if they wish to become pregnant or think they might be pregnant. There is a potential for serious side effects to an unborn child. Talk to your health care professional or pharmacist for more information. Do not breast-feed an infant while taking this medicine. Check with your doctor or health care professional if you get an attack of severe diarrhea, nausea and vomiting, or if you sweat a lot. The loss of too much body fluid can make it dangerous for you to take this medicine. What side effects may I notice from receiving this medicine? Side effects that you  should report to your doctor or health care professional as soon as possible: -allergic reactions like skin rash, itching or hives, swelling of the face, lips, or tongue -breathing problems -changes in hearing -changes in vision -fast, irregular heartbeat -feeling faint or lightheaded, falls -pain, tingling, numbness in the hands or feet -right upper belly pain -seizures -swelling of the ankles, feet, hands -unusual bleeding or bruising -unusually weak or tired -vomiting -yellowing of the eyes or skin Side effects that usually do not require medical attention (report to your doctor or health care professional if they continue or are bothersome): -changes in emotions or moods -constipation -diarrhea -loss of appetite -headache -irritation at site where injected -nausea This list may not describe all possible side effects. Call your doctor for medical advice about side effects. You may report side effects to FDA at 1-800-FDA-1088. Where should I keep my medicine? This drug is given in a hospital or clinic and will not be stored at home. NOTE: This sheet is a summary. It may not cover all possible information. If you have questions about this medicine, talk to your doctor, pharmacist, or health care provider.  2015, [employer]vier/Gold Standard. (2012-11-15 12:46:32)

## 2014-03-20 NOTE — Progress Notes (Signed)
Dean [name]'s reason for visit today is for an injection  [name] also received velcade in left abdomen sq per MD orders; see [facility] for administration details.  [name] tolerated all procedures well and without incident; questions were answered and patient was discharged.   [place] Tolerated chemotherapy injection well today.  Discharged ambulatory

## 2014-03-21 ENCOUNTER — Encounter: Payer: Self-pay | Admitting: Radiation Oncology

## 2014-03-21 ENCOUNTER — Encounter (HOSPITAL_COMMUNITY): Payer: Self-pay | Admitting: Hematology & Oncology

## 2014-03-21 LAB — MULTIPLE MYELOMA PANEL, SERUM
Albumin ELP: 53 % — ABNORMAL LOW (ref 55.8–66.1)
Alpha-1-Globulin: 5.1 % — ABNORMAL HIGH (ref 2.9–4.9)
Alpha-2-Globulin: 13 % — ABNORMAL HIGH (ref 7.1–11.8)
Beta 2: 4.4 % (ref 3.2–6.5)
Beta Globulin: 5.3 % (ref 4.7–7.2)
Gamma Globulin: 19.2 % — ABNORMAL HIGH (ref 11.1–18.8)
IgA: 167 mg/dL (ref 68–379)
IgG (Immunoglobin G), Serum: 1360 mg/dL (ref 650–1600)
IgM, Serum: 27 mg/dL — ABNORMAL LOW (ref 41–251)
M-Spike, %: 0.84 g/dL
Total Protein: 6.4 g/dL (ref 6.0–8.3)

## 2014-03-21 NOTE — Progress Notes (Signed)
  Radiation Oncology         [phone] ________________________________  Name: Dean [name] MRN: [id number]  Date: 03/21/2014  DOB: Jun 22, 1935  End of Treatment Note  Diagnosis:     Multiple myeloma   Indication for treatment:  Painful soft tissue/chest wall mass       Radiation treatment dates:   January 19 through February 3  Site/dose:  Right posterior chest wall area, 30 gray in 10 fractions  Beams/energy:   3-D conformal, 15x, 6x  Narrative: The patient tolerated radiation treatment relatively well.   He had good improvement in his pain along the right posterior chest region  Plan: The patient has completed radiation treatment. The patient will return to radiation oncology clinic for routine followup in one month. I advised them to call or return sooner if they have any questions or concerns related to their recovery or treatment.  -----------------------------------  Blair Promise, PhD, MD

## 2014-03-21 NOTE — Assessment & Plan Note (Signed)
Present 79 year old male with IgG kappa multiple myeloma based upon bone marrow biopsy and the presence of bone disease. I am somewhat concerned however that the myeloma survey is not giving [place] a good picture of his bone disease. I have recommended a PET scan which is in accordance with the NCCN guidelines. Feel very strongly we need to get a better assessment of the extent of his bone involvement.  He has a normal CBC, normal kidney function, barely detectable M spike, normal calcium, and following his disease moving forward may require additional imaging. But we will address this once he starts therapy. I discussed current guidelines with the patient and his daughter and based upon his bone marrow biopsy and cytogenetics have recommended RVD and zometa.  I spent an extensive amount of time discussing side effects of treatment, benefits of treatment, and assured them that we are here to help him get through therapy. We discussed remission rates with RVD. We will arrange for formal chemotherapy teaching. I will see him back prior to initiation of therapy.

## 2014-03-22 ENCOUNTER — Ambulatory Visit (HOSPITAL_COMMUNITY)
Admission: RE | Admit: 2014-03-22 | Discharge: 2014-03-22 | Disposition: A | Discharge status: Home / Self Care | Payer: Medicare Other | Source: Ambulatory Visit | Attending: Hematology & Oncology | Admitting: Hematology & Oncology

## 2014-03-22 DIAGNOSIS — M5126 Other intervertebral disc displacement, lumbar region: Secondary | ICD-10-CM | POA: Diagnosis not present

## 2014-03-22 DIAGNOSIS — C9000 Multiple myeloma not having achieved remission: Secondary | ICD-10-CM | POA: Diagnosis not present

## 2014-03-22 MED ORDER — GADOBENATE DIMEGLUMINE 529 MG/ML IV SOLN
20.0000 mL | Freq: Once | INTRAVENOUS | Status: AC | PRN
  Administered 2014-03-22: 20 mL via INTRAVENOUS

## 2014-03-24 ENCOUNTER — Encounter: Payer: Self-pay | Admitting: Dietician

## 2014-03-24 ENCOUNTER — Encounter (HOSPITAL_COMMUNITY): Payer: Medicare Other

## 2014-03-24 ENCOUNTER — Encounter (HOSPITAL_BASED_OUTPATIENT_CLINIC_OR_DEPARTMENT_OTHER): Payer: Medicare Other

## 2014-03-24 ENCOUNTER — Ambulatory Visit (HOSPITAL_COMMUNITY)
Admission: RE | Admit: 2014-03-24 | Discharge: 2014-03-24 | Disposition: A | Discharge status: Home / Self Care | Payer: Medicare Other | Source: Ambulatory Visit | Attending: Hematology & Oncology | Admitting: Hematology & Oncology

## 2014-03-24 ENCOUNTER — Other Ambulatory Visit (HOSPITAL_COMMUNITY): Payer: Medicare Other

## 2014-03-24 ENCOUNTER — Encounter (HOSPITAL_BASED_OUTPATIENT_CLINIC_OR_DEPARTMENT_OTHER): Payer: Medicare Other | Admitting: Hematology & Oncology

## 2014-03-24 VITALS — BP 139/67 | HR 81 | Temp 97.8°F | Resp 20 | Wt 238.6 lb

## 2014-03-24 DIAGNOSIS — M79641 Pain in right hand: Secondary | ICD-10-CM | POA: Insufficient documentation

## 2014-03-24 DIAGNOSIS — M545 Low back pain: Secondary | ICD-10-CM

## 2014-03-24 DIAGNOSIS — M7989 Other specified soft tissue disorders: Secondary | ICD-10-CM

## 2014-03-24 DIAGNOSIS — D472 Monoclonal gammopathy: Secondary | ICD-10-CM | POA: Diagnosis not present

## 2014-03-24 DIAGNOSIS — K219 Gastro-esophageal reflux disease without esophagitis: Secondary | ICD-10-CM | POA: Diagnosis not present

## 2014-03-24 DIAGNOSIS — R634 Abnormal weight loss: Secondary | ICD-10-CM | POA: Diagnosis not present

## 2014-03-24 DIAGNOSIS — C9000 Multiple myeloma not having achieved remission: Secondary | ICD-10-CM

## 2014-03-24 DIAGNOSIS — N400 Enlarged prostate without lower urinary tract symptoms: Secondary | ICD-10-CM | POA: Diagnosis not present

## 2014-03-24 DIAGNOSIS — R630 Anorexia: Secondary | ICD-10-CM | POA: Diagnosis not present

## 2014-03-24 DIAGNOSIS — M899 Disorder of bone, unspecified: Secondary | ICD-10-CM | POA: Diagnosis not present

## 2014-03-24 LAB — COMPREHENSIVE METABOLIC PANEL
ALT: 13 U/L (ref 0–53)
AST: 16 U/L (ref 0–37)
Albumin: 3.4 g/dL — ABNORMAL LOW (ref 3.5–5.2)
Alkaline Phosphatase: 35 U/L — ABNORMAL LOW (ref 39–117)
Anion gap: 3 — ABNORMAL LOW (ref 5–15)
BUN: 16 mg/dL (ref 6–23)
CO2: 27 mmol/L (ref 19–32)
Calcium: 8.6 mg/dL (ref 8.4–10.5)
Chloride: 108 mmol/L (ref 96–112)
Creatinine, Ser: 0.69 mg/dL (ref 0.50–1.35)
GFR calc Af Amer: >90 mL/min (ref >90)
GFR calc non Af Amer: 90 mL/min — ABNORMAL LOW (ref >90)
Glucose, Bld: 86 mg/dL (ref 70–99)
Potassium: 3.9 mmol/L (ref 3.5–5.1)
Sodium: 138 mmol/L (ref 135–145)
Total Bilirubin: 0.5 mg/dL (ref 0.3–1.2)
Total Protein: 6.5 g/dL (ref 6.0–8.3)

## 2014-03-24 LAB — CBC WITH DIFFERENTIAL/PLATELET
Basophils Absolute: 0 10*3/uL (ref 0.0–0.1)
Basophils Relative: 0 % (ref 0–1)
Eosinophils Absolute: 0 10*3/uL (ref 0.0–0.7)
Eosinophils Relative: 0 % (ref 0–5)
HCT: 41.8 % (ref 39.0–52.0)
Hemoglobin: 13.9 g/dL (ref 13.0–17.0)
Lymphocytes Relative: 18 % (ref 12–46)
Lymphs Abs: 1 10*3/uL (ref 0.7–4.0)
MCH: 29.7 pg (ref 26.0–34.0)
MCHC: 33.3 g/dL (ref 30.0–36.0)
MCV: 89.3 fL (ref 78.0–100.0)
Monocytes Absolute: 0.6 10*3/uL (ref 0.1–1.0)
Monocytes Relative: 10 % (ref 3–12)
Neutro Abs: 4.1 10*3/uL (ref 1.7–7.7)
Neutrophils Relative %: 72 % (ref 43–77)
Platelets: 128 10*3/uL — ABNORMAL LOW (ref 150–400)
RBC: 4.68 MIL/uL (ref 4.22–5.81)
RDW: 14.3 % (ref 11.5–15.5)
WBC: 5.8 10*3/uL (ref 4.0–10.5)

## 2014-03-24 NOTE — Patient Instructions (Signed)
[place] at [facility] Discharge Instructions  RECOMMENDATIONS MADE BY THE CONSULTANT AND ANY TEST RESULTS WILL BE SENT TO YOUR REFERRING PHYSICIAN.  Exam and discussion by Dr. Whitney Muse. Will hold chemo today.  Hand xrays only show swelling - no breaks Ice the area for 10 - 15 minutes  4 times daily through the weekend Aleve 2 tablets twice daily for 3 days.  Thank you for choosing [place] at [facility] to provide your oncology and hematology care.  To afford each patient quality time with our provider, please arrive at least 15 minutes before your scheduled appointment time.    You need to re-schedule your appointment should you arrive 10 or more minutes late.  We strive to give you quality time with our providers, and arriving late affects you and other patients whose appointments are after yours.  Also, if you no show three or more times for appointments you may be dismissed from the clinic at the providers discretion.     Again, thank you for choosing [facility].  Our hope is that these requests will decrease the amount of time that you wait before being seen by our physicians.       _____________________________________________________________  Should you have questions after your visit to [facility], please contact our office at [phone] between the hours of 8:30 a.m. and 4:30 p.m.  Voicemails left after 4:30 p.m. will not be returned until the following business day.  For prescription refill requests, have your pharmacy contact our office.

## 2014-03-24 NOTE — Progress Notes (Signed)
Patient identified to be at risk for malnutrition on the MST secondary to weight loss  Contacted Pt by Phone  Wt Readings from Last 10 Encounters:  03/24/14 238 lb 9.6 oz (108.228 kg)  03/22/14 230 lb (104.327 kg)  03/20/14 239 lb 6.4 oz (108.591 kg)  03/17/14 237 lb 12.8 oz (107.865 kg)  03/07/14 239 lb 4.8 oz (108.546 kg)  02/16/14 239 lb (108.41 kg)  02/06/14 238 lb (107.956 kg)  01/16/14 237 lb (107.502 kg)  01/05/14 241 lb 9.6 oz (109.589 kg)  01/02/14 240 lb (108.863 kg)    Though patients weight has recently evened out, he has lost ~20 pounds in the last year. When I talked to him he said that he had been trying to lose weight recently.   Patient reports oral intake as fair-good but is suffering from lack of taste and taste changes 2/2 radiation therapy  Discussed with pt ways to mitigate the effect of the radiation and bring flavor back to food.   Pt seemed to be receptive to what I had to say and thankful that I called.   Mailed my contact info  and handout titled "Taste and Smell Changes"   Burtis Junes RD, LDN Nutrition Pager: [phone] 03/24/2014 3:45 PM

## 2014-03-24 NOTE — Progress Notes (Signed)
Labs for cbcd,cmp 

## 2014-03-27 ENCOUNTER — Inpatient Hospital Stay (HOSPITAL_COMMUNITY): Payer: Medicare Other

## 2014-03-27 ENCOUNTER — Ambulatory Visit (HOSPITAL_COMMUNITY): Payer: Medicare Other | Admitting: Hematology & Oncology

## 2014-03-30 ENCOUNTER — Encounter (HOSPITAL_COMMUNITY): Payer: Medicare Other

## 2014-03-30 ENCOUNTER — Encounter (HOSPITAL_BASED_OUTPATIENT_CLINIC_OR_DEPARTMENT_OTHER): Payer: Medicare Other | Admitting: Oncology

## 2014-03-30 VITALS — BP 132/68 | HR 83 | Temp 98.1°F | Resp 18 | Wt 232.5 lb

## 2014-03-30 DIAGNOSIS — C9000 Multiple myeloma not having achieved remission: Secondary | ICD-10-CM | POA: Diagnosis not present

## 2014-03-30 DIAGNOSIS — G4700 Insomnia, unspecified: Secondary | ICD-10-CM

## 2014-03-30 MED ORDER — ZOLPIDEM TARTRATE 5 MG PO TABS: 5.0000 mg | ORAL_TABLET | Freq: Every evening | ORAL | Status: DC | PRN

## 2014-03-30 NOTE — Assessment & Plan Note (Signed)
S/P Day 1 and 4 of cycle 1 of VD.  He has edema of fingers #2 and #3 which the patient thought was from [place].  Due to his concerns, we held day 8 and 11 of cycle 1.  In the interim, we got Revlimid approved after insurance company denied this standard of care of treatment.  Therefore, day 8 and 11 of cycle 1 are cancelled/deleted in lieu of moving on to RVD.  Treatment plan built and developed.  He will bring Revlimid on day 1 of cycle 1 of RVD and start that day/evening.  He is currently on ASA 325 mg daily and he is to continue with this prophylaxis intervention.  All questions are answered regarding the regimen.  He will continue with weekly Dexamethasone.  Return on day 1 of treatment.

## 2014-03-30 NOTE — Progress Notes (Signed)
Dean Fraction, MD [address] [place] [address]  Insomnia - Plan: zolpidem (AMBIEN) 5 MG tablet  Multiple myeloma without remission  CURRENT THERAPY: Velcade and Dexamethasone beginning on 03/17/2014 in a day 1, 4, 8, 11 every 21 day fashion with difficulty getting Revlimid approved through his insurance. S/P XRT to rib lesion.  INTERVAL HISTORY: Dean [name] 79 y.o. male returns for followup of multiple myeloma, monoclonal IgG kappa with scattered osseous lesions.    Multiple myeloma without remission   11/16/2012 Initial Diagnosis L occipital condyle destructive lesion, not biopsied secondary to location. XRT   12/01/2012 Imaging L3 superior endplate compression FX, no lytic lesions to suggest myeloma   02/06/2014 Imaging soft tissue mass along the right posterior fifth rib with some rib destruction   02/16/2014 Miscellaneous Normal CBC, Normal CMP, kappa,lamda ratio of 2.62 (abnormal), UIEP with slightly restricted band, IgG kappa, SPEP/IEP with monoclonal protein at 0.79 g/dl, normal igG, suppressed IGM   02/20/2014 Bone Marrow Biopsy Normal cytogenetics and myeloma FISH profile.   02/21/2014 - 03/08/2014 Radiation Therapy 30Gy to R rib lesion, lesion not biopsied   03/16/2014 PET scan Scattered hypermetabolic osseous lesions, including the calvarium, ribs, left scapula, sacrum, and left femoral shaft. An expansile left vertebral body lesion at L3 extends into the epidural space of the left lateral recess,    03/17/2014 -  Chemotherapy Velcade and Dexamethasone due to initial denial of Revlimid by insurance company.  Zometa monthly.   03/22/2014 Imaging MR_ L-Spine- Enhancing lesions compatible with multiple myeloma at L2, L3, and S1-2.   I personally reviewed and went over laboratory results with the patient.  The results are noted within this dictation.  He reports a history of right #2 and #3 fingers being tight with difficulty with flexion of all  interphalangeal joints. This has since resolved.  It is unrelated to Velcade and suspected to be edema related.  He was treated with OTC anti-inflammatories.   He complains of insomnia.  He is taking up to 2 mg of Xanax at HS without too much improvement in sleeping.  He has tried restoril in the past without significant improvement.  I do not see on his medication list where he has tried Ambien.  Therefore, I will give him an RX for Ambien.  He is to take 5 mg at HS and if ineffective, he will take 10 mg at HS.  He will update [place] accordingly.   He is educated about RVD therapy.  We will start this next week.  I reviewed the side effects of Revlimid.  He is already taking 325 mg of ASA daily and he is strongly encouraged to continue with this intervention.  All questions regarding RVD are answered.    Oncologically, he denies any complaints and ROS questioning is negative.  Past Medical History  Diagnosis Date  . Neuropathy   . BPH (benign prostatic hyperplasia)   . GERD (gastroesophageal reflux disease)   . H/O ETOH abuse   . Skull lesion     Left occipital condyle  . History of radiation therapy 01/05/13-02/10/13    45 gray to left occipital condyle region  . Multiple myeloma     has Neuropathy; BPH (benign prostatic hyperplasia); GERD (gastroesophageal reflux disease); H/O ETOH abuse; Skull lesion; Neck pain; Fatigue; Loss of weight; Insomnia; Allergic rhinitis; Lumbar compression fracture; Dermatitis; and Multiple myeloma without remission on his problem list.     is  allergic to codeine and morphine and related.  Dean [name] does not currently have medications on file.  Past Surgical History  Procedure Laterality Date  . Cyst excision  1959    tail bone    Denies any headaches, dizziness, double vision, fevers, chills, night sweats, nausea, vomiting, diarrhea, constipation, chest pain, heart palpitations, shortness of breath, blood in stool, black tarry stool, urinary pain, urinary  burning, urinary frequency, hematuria.   PHYSICAL EXAMINATION  ECOG PERFORMANCE STATUS: 0 - Asymptomatic  Filed Vitals:   03/30/14 1100  BP: 132/68  Pulse: 83  Temp: 98.1 F (36.7 C)  Resp: 18    GENERAL:alert, no distress, well nourished, well developed, comfortable, cooperative, obese, smiling and wearing dark sunglasses, accompanied by his daughter. SKIN: skin color, texture, turgor are normal, no rashes or significant lesions HEAD: Normocephalic, No masses, lesions, tenderness or abnormalities EYES: normal, PERRLA, EOMI, Conjunctiva are pink and non-injected EARS: External ears normal OROPHARYNX:lips, buccal mucosa, and tongue normal and mucous membranes are moist  NECK: supple, no adenopathy, thyroid normal size, non-tender, without nodularity, trachea midline LYMPH:  no palpable lymphadenopathy BREAST:not examined LUNGS: not examined HEART: not examined ABDOMEN:not examined BACK: Back symmetric, no curvature. EXTREMITIES:less then 2 second capillary refill, no joint deformities, effusion, or inflammation, no skin discoloration, no clubbing, no cyanosis  NEURO: alert & oriented x 3 with fluent speech, no focal motor/sensory deficits, gait normal   LABORATORY DATA: CBC    Component Value Date/Time   WBC 5.8 03/24/2014 0924   RBC 4.68 03/24/2014 0924   HGB 13.9 03/24/2014 0924   HCT 41.8 03/24/2014 0924   PLT 128* 03/24/2014 0924   MCV 89.3 03/24/2014 0924   MCH 29.7 03/24/2014 0924   MCHC 33.3 03/24/2014 0924   RDW 14.3 03/24/2014 0924   LYMPHSABS 1.0 03/24/2014 0924   MONOABS 0.6 03/24/2014 0924   EOSABS 0.0 03/24/2014 0924   BASOSABS 0.0 03/24/2014 0924      Chemistry      Component Value Date/Time   NA 138 03/24/2014 0924   K 3.9 03/24/2014 0924   CL 108 03/24/2014 0924   CO2 27 03/24/2014 0924   BUN 16 03/24/2014 0924   CREATININE 0.69 03/24/2014 0924   CREATININE 0.76 02/06/2014 1249      Component Value Date/Time   CALCIUM 8.6 03/24/2014 0924     ALKPHOS 35* 03/24/2014 0924   AST 16 03/24/2014 0924   ALT 13 03/24/2014 0924   BILITOT 0.5 03/24/2014 0924      RADIOGRAPHIC STUDIES:  Mr Lumbar Spine W Wo Contrast  03/22/2014   CLINICAL DATA:  Multiple myeloma.  Abnormal PET scan.  EXAM: MRI LUMBAR SPINE WITHOUT AND WITH CONTRAST  TECHNIQUE: Multiplanar and multiecho pulse sequences of the lumbar spine were obtained without and with intravenous contrast.  CONTRAST:  64m MULTIHANCE GADOBENATE DIMEGLUMINE 529 MG/ML IV SOLN  COMPARISON:  PET scan 03/16/2014. Lumbar spine radiographs 01/02/2014. CT of the abdomen and pelvis 11/30/2012.  FINDINGS: Normal signal is present in the conus medullaris which terminates at T12-L1, within normal limits. Benign appearing hemangiomas are noted at T12 and L1. Rightward curvature of the lumbar spine is centered at L3-4. A superior endplate fracture at L4 is mostly healed, present on the plain film radiographs of 01/02/2014. There is some enhancement within the L4 vertebral body which could be related to the multiple myeloma.  Enhancing mass lesion in the posterior body of the health posterior L3 body extends into the left pedicle. The lesion measures  2.4 x 2.8 cm. The smaller lesion within the posterior central L2 vertebral body near the superior endplate measures 1.3 x 1.5 x 1.1 cm. A hemangioma is noted on the left at L2.  Enhancing lesion involves the right paramedian sacrum at the S1-2 level. This spans the disc space without significant extraosseous extension.  Limited imaging of the abdomen is unremarkable. There is no significant adenopathy.  L1-2: A rightward disc protrusion and endplate osteophyte formation results in moderate right foraminal stenosis. The central canal and left foramen are patent.  L2-3: A broad-based disc protrusion is present. There is mild central canal narrowing. Mild foraminal narrowing is present bilaterally.  L3-4: A leftward disc protrusion is present. There is mild subarticular  stenosis, worse on the left. Facet hypertrophy contributes to mild to moderate foraminal narrowing bilaterally, slightly worse on the right.  L4-5: A broad-based disc protrusion is present. Mild facet hypertrophy is noted bilaterally. Mild central and left foraminal stenosis is evident.  L5-S1: A shallow broad-based disc protrusion is present without significant stenosis.  IMPRESSION: 1. Enhancing lesions compatible with multiple myeloma at L2, L3, and S1-2. 2. Nearly healed superior endplate L4 compression fracture with some residual enhancement centrally. This could represent another lesion of multiple myeloma but appears to have different signal characteristics it is more likely related to the healing fracture. Will 3. Multiple benign appearing hemangioma is at T12, L1, and L2. 4. Scoliosis. 5. Moderate right foraminal stenosis at L1-2. 6. Mild central and bilateral foraminal narrowing at L2-3. 7. Some reticular narrowing is worse on the left at L3-4 with mild to moderate foraminal stenosis worse on the right. 8. Mild central and left foraminal stenosis at L4-5.   Electronically Signed   By: San Morelle M.D.   On: 03/22/2014 12:19   Nm Pet Image Initial (pi) Whole Body  03/16/2014   CLINICAL DATA:  Initial treatment strategy for multiple myeloma.  EXAM: NUCLEAR MEDICINE PET WHOLE BODY  TECHNIQUE: Twelve mCi F-18 FDG was injected intravenously. Full-ring PET imaging was performed from the vertex to the feet after the radiotracer. CT data was obtained and used for attenuation correction and anatomic localization.  FASTING BLOOD GLUCOSE:  Value:  One hundred two mg/dl  COMPARISON:  Multiple exams, including 02/20/2014 and 11/30/2012  RADIOPHARMACEUTICALS:  Twelve mCi Technetium-99 MDP  FINDINGS: Head/Neck: 1.1 cm lytic lesion in the right frontotemporal bone region, probably with faintly increased metabolic activity, but not readily measure due to the proximity of the adjacent high a brain activity.   Chest: There is an unusual degree of reduced activity in the left ventricular myocardial apex and adjacent septum, enough to raise suspicion for prior myocardial infarction. Correlate with patient history.  Abdomen/Pelvis: Diffuse uptake in the descending and sigmoid colon in a region of significant diverticulosis is present, and extends down into the rectum. Given the diffuse nature of this activity, and is ascribed to physiologic etiology.  Enlarged prostate gland. Small umbilical hernia contains adipose tissue. Aortoiliac atherosclerotic vascular disease.  Skeleton: A variety of hypermetabolic bony lesions are observed compatible with myeloma.  Among these are a right second rib lesion with maximum standard uptake value 4.2; a left posterior glenoid lesion with maximum standard uptake value 4.1; an expansile right fifth rib primarily lytic lesion with maximum standard uptake value 3.0 ; a left L3 lytic lesion measuring 2.7 cm in diameter on image 168 of series 3 with maximum standard uptake value of 9.1 and with epidural extension in the left anterior lateral  recess ; an expansile right sacral lesion with maximum standard uptake value 8.1 ; a left lower lytic sacral lesion with maximum standard uptake value 8.3 ; and a left mid femoral shaft lesion with maximum standard uptake value 2.7.  Several other smaller scattered lesions are present, such as the small lytic lesion in the left lateral clavicle on image 70 of series 3.  IMPRESSION: 1. Scattered hypermetabolic osseous lesions, including the calvarium, ribs, left scapula, sacrum, and left femoral shaft. An expansile left vertebral body lesion at L3 extends into the epidural space of the left lateral recess, and probably merits lumbar spine MRI assessment. 2. Reduced activity in the left ventricular myocardial apex and adjacent septum, suspicious for prior myocardial infarction. 3. Physiologic activity in the distal colon. Descending and sigmoid colon  diverticulosis. 4.  Aortoiliac atherosclerotic vascular disease. 5. Enlarged prostate gland.   Electronically Signed   By: Van Clines M.D.   On: 03/16/2014 14:56   Dg Hand Complete Right  03/24/2014   CLINICAL DATA:  RIGHT hand pain and swelling for several days. No reported injury.  EXAM: RIGHT HAND - COMPLETE 3+ VIEW  COMPARISON:  None.  FINDINGS: There is no evidence of fracture or dislocation. There is no evidence of arthropathy or other focal bone abnormality. Mild diffuse soft tissue swelling without visible foreign body or laceration.  IMPRESSION: No osseous abnormality.  Mild diffuse soft tissue swelling.   Electronically Signed   By: Rolla Flatten M.D.   On: 03/24/2014 11:35      ASSESSMENT AND PLAN:  Multiple myeloma without remission S/P Day 1 and 4 of cycle 1 of VD.  He has edema of fingers #2 and #3 which the patient thought was from [place].  Due to his concerns, we held day 8 and 11 of cycle 1.  In the interim, we got Revlimid approved after insurance company denied this standard of care of treatment.  Therefore, day 8 and 11 of cycle 1 are cancelled/deleted in lieu of moving on to RVD.  Treatment plan built and developed.  He will bring Revlimid on day 1 of cycle 1 of RVD and start that day/evening.  He is currently on ASA 325 mg daily and he is to continue with this prophylaxis intervention.  All questions are answered regarding the regimen.  He will continue with weekly Dexamethasone.  Return on day 1 of treatment.   Insomnia He has failed Xanax, up to 2 mg at HS, and Restoril in the past.  Therefore, I will try Ambien 5 mg at HS and this may be increased to 10 mg at HS.  He will update [place] in the near future regarding the effectiveness of this.    THERAPY PLAN:  We are changing to RVD therapy now that his insurance finally approved Revlimid after my letter of medical necessity. Will start next week.  All questions were answered. The patient knows to call the clinic with  any problems, questions or concerns. We can certainly see the patient much sooner if necessary.  Patient and plan discussed with Dr. Ancil Linsey and she is in agreement with the aforementioned.   This note is electronically signed by: Robynn Pane 03/30/2014 12:54 PM

## 2014-03-30 NOTE — Assessment & Plan Note (Signed)
He has failed Xanax, up to 2 mg at HS, and Restoril in the past.  Therefore, I will try Ambien 5 mg at HS and this may be increased to 10 mg at HS.  He will update [place] in the near future regarding the effectiveness of this.

## 2014-03-30 NOTE — Patient Instructions (Addendum)
[place] at [facility] Discharge Instructions  RECOMMENDATIONS MADE BY THE CONSULTANT AND ANY TEST RESULTS WILL BE SENT TO YOUR REFERRING PHYSICIAN.  Exam and discussion by Robynn Pane, PA - C. Will not treat today but will restart next week. Continue the Aspirin. Bring the Revlimid when you come next week.  Follow-up with provider in 2 weeks on Day 8 of therapy.  Thank you for choosing [place] at [facility] to provide your oncology and hematology care.  To afford each patient quality time with our provider, please arrive at least 15 minutes before your scheduled appointment time.    You need to re-schedule your appointment should you arrive 10 or more minutes late.  We strive to give you quality time with our providers, and arriving late affects you and other patients whose appointments are after yours.  Also, if you no show three or more times for appointments you may be dismissed from the clinic at the providers discretion.     Again, thank you for choosing [facility].  Our hope is that these requests will decrease the amount of time that you wait before being seen by our physicians.       _____________________________________________________________  Should you have questions after your visit to [facility], please contact our office at [phone] between the hours of 8:30 a.m. and 4:30 p.m.  Voicemails left after 4:30 p.m. will not be returned until the following business day.  For prescription refill requests, have your pharmacy contact our office.

## 2014-03-30 NOTE — Progress Notes (Unsigned)
Labs for cbcd,ferr 

## 2014-03-31 ENCOUNTER — Inpatient Hospital Stay (HOSPITAL_COMMUNITY): Payer: Medicare Other

## 2014-03-31 ENCOUNTER — Other Ambulatory Visit (HOSPITAL_COMMUNITY): Payer: Medicare Other

## 2014-04-05 ENCOUNTER — Encounter: Payer: Self-pay | Admitting: Oncology

## 2014-04-06 ENCOUNTER — Ambulatory Visit
Admission: RE | Admit: 2014-04-06 | Discharge: 2014-04-06 | Disposition: A | Discharge status: Home / Self Care | Payer: Medicare Other | Source: Ambulatory Visit | Attending: Radiation Oncology | Admitting: Radiation Oncology

## 2014-04-06 ENCOUNTER — Encounter: Payer: Self-pay | Admitting: Radiation Oncology

## 2014-04-06 VITALS — BP 136/70 | HR 67 | Temp 97.4°F | Resp 16 | Ht 68.0 in | Wt 233.1 lb

## 2014-04-06 DIAGNOSIS — C9000 Multiple myeloma not having achieved remission: Secondary | ICD-10-CM

## 2014-04-06 NOTE — Progress Notes (Signed)
Radiation Oncology         [phone] ________________________________  Name: Dean [name] MRN: [id number]  Date: 04/06/2014  DOB: 01-24-1936  Follow-Up Visit Note  CC: [name], MD  [name], MD    ICD-9-CM ICD-10-CM   1. Multiple myeloma without remission 203.00 C90.00     Diagnosis:   Multiple myeloma  Interval Since Last Radiation:  1  months  Narrative:  The patient returns today for routine follow-up.  He completed radiation therapy to the right posterior chest wall for painful soft tissue/rib mass. He denies any pain in this area at this time. He has had some pain in his low back and right hip pain. Patient has started on Velcade and Decadron for his myeloma.  The patient did undergo a PET scan and MRI the lumbar spine since his chest radiation was complete. Patient was noted to have significant disease in the mid lumbar spine.  He was noted to have a small area of uptake in the left femur. He denies any pain in the left leg with standing or other issues.                              ALLERGIES:  is allergic to codeine and morphine and related.  Meds: Current Outpatient Prescriptions  Medication Sig Dispense Refill  . acyclovir (ZOVIRAX) 400 MG tablet Take 1 tablet (400 mg total) by mouth 2 (two) times daily. 60 tablet 5  . ALPRAZolam (XANAX) 1 MG tablet Take 1 tablet (1 mg total) by mouth 2 (two) times daily as needed. For anxiety and/or sleep 60 tablet 0  . aspirin EC 325 MG tablet Take 1 tablet (325 mg total) by mouth daily. 30 tablet 11  . bortezomib IV (VELCADE) 3.5 MG injection Inject 1.3 mg/m2 into the vein. Days 1, 8, 15 every 28 days    . Calcium Carb-Cholecalciferol (CALCIUM-VITAMIN D3) 600-500 MG-UNIT CAPS Take 1 tablet by mouth 2 (two) times daily. With food 60 capsule 6  . dexamethasone (DECADRON) 4 MG tablet Take 10 tablets (74m total) on Days 1, 8, & 15 of chemo. 70 tablet 1  . diphenhydrAMINE (BENADRYL) 25 MG tablet Take 25 mg by mouth  at bedtime as needed for sleep.    .Marland Kitchengabapentin (NEURONTIN) 400 MG capsule Take 1 capsule (400 mg total) by mouth 4 (four) times daily. May take up to five times daily depending on pain in feet (Patient taking differently: Take 400 mg by mouth 2 (two) times daily as needed (for neuropathy pain). May take up to five times daily depending on pain in feet) 180 capsule 5  . lansoprazole (PREVACID) 30 MG capsule Take 1 capsule (30 mg total) by mouth daily at 12 noon. 30 capsule 3  . lenalidomide (REVLIMID) 25 MG capsule Take 1 capsule (25 mg total) by mouth daily. 14 capsule 0  . ondansetron (ZOFRAN) 8 MG tablet Take 1 tablet (8 mg total) by mouth every 8 (eight) hours as needed for nausea or vomiting. 30 tablet 2  . oxyCODONE-acetaminophen (PERCOCET) 10-325 MG per tablet Take 1 tablet by mouth every 4 (four) hours as needed for pain. 60 tablet 0  . tamsulosin (FLOMAX) 0.4 MG CAPS capsule TAKE ONE CAPSULE EVERY EVENING. 30 capsule 5  . Zoledronic Acid (ZOMETA IV) Inject into the vein. Every 28 days    . Aspirin-Salicylamide-Caffeine (BC HEADACHE POWDER PO) Take 1 packet by mouth 2 (two)  times daily as needed.    . clotrimazole (LOTRIMIN) 1 % cream Apply 1 application topically 2 (two) times daily. To ear (Patient not taking: Reported on 04/06/2014) 45 g 2  . zolpidem (AMBIEN) 5 MG tablet Take 1-2 tablets (5-10 mg total) by mouth at bedtime as needed for sleep. (Patient not taking: Reported on 04/06/2014) 60 tablet 1   No current facility-administered medications for this encounter.    Physical Findings: The patient is in no acute distress. Patient is alert and oriented.  height is 5' 8"  (1.727 m) and weight is 233 lb 1.6 oz (105.733 kg). His oral temperature is 97.4 F (36.3 C). His blood pressure is 136/70 and his pulse is 67. His respiration is 16 and oxygen saturation is 97%. .  The lungs are clear. The heart has a regular rhythm and rate. The abdomen is soft and nontender with normal bowel sounds. On  neurological examination motor strength is 5 out of 5 in the proximal and distal muscle groups. Palpation along the right chest wall area reveals no point tenderness.  Lab Findings: Lab Results  Component Value Date   WBC 5.8 03/24/2014   HGB 13.9 03/24/2014   HCT 41.8 03/24/2014   MCV 89.3 03/24/2014   PLT 128* 03/24/2014    Radiographic Findings: Mr Lumbar Spine W Wo Contrast  03/22/2014   CLINICAL DATA:  Multiple myeloma.  Abnormal PET scan.  EXAM: MRI LUMBAR SPINE WITHOUT AND WITH CONTRAST  TECHNIQUE: Multiplanar and multiecho pulse sequences of the lumbar spine were obtained without and with intravenous contrast.  CONTRAST:  44m MULTIHANCE GADOBENATE DIMEGLUMINE 529 MG/ML IV SOLN  COMPARISON:  PET scan 03/16/2014. Lumbar spine radiographs 01/02/2014. CT of the abdomen and pelvis 11/30/2012.  FINDINGS: Normal signal is present in the conus medullaris which terminates at T12-L1, within normal limits. Benign appearing hemangiomas are noted at T12 and L1. Rightward curvature of the lumbar spine is centered at L3-4. A superior endplate fracture at L4 is mostly healed, present on the plain film radiographs of 01/02/2014. There is some enhancement within the L4 vertebral body which could be related to the multiple myeloma.  Enhancing mass lesion in the posterior body of the health posterior L3 body extends into the left pedicle. The lesion measures 2.4 x 2.8 cm. The smaller lesion within the posterior central L2 vertebral body near the superior endplate measures 1.3 x 1.5 x 1.1 cm. A hemangioma is noted on the left at L2.  Enhancing lesion involves the right paramedian sacrum at the S1-2 level. This spans the disc space without significant extraosseous extension.  Limited imaging of the abdomen is unremarkable. There is no significant adenopathy.  L1-2: A rightward disc protrusion and endplate osteophyte formation results in moderate right foraminal stenosis. The central canal and left foramen are  patent.  L2-3: A broad-based disc protrusion is present. There is mild central canal narrowing. Mild foraminal narrowing is present bilaterally.  L3-4: A leftward disc protrusion is present. There is mild subarticular stenosis, worse on the left. Facet hypertrophy contributes to mild to moderate foraminal narrowing bilaterally, slightly worse on the right.  L4-5: A broad-based disc protrusion is present. Mild facet hypertrophy is noted bilaterally. Mild central and left foraminal stenosis is evident.  L5-S1: A shallow broad-based disc protrusion is present without significant stenosis.  IMPRESSION: 1. Enhancing lesions compatible with multiple myeloma at L2, L3, and S1-2. 2. Nearly healed superior endplate L4 compression fracture with some residual enhancement centrally. This could represent another lesion  of multiple myeloma but appears to have different signal characteristics it is more likely related to the healing fracture. Will 3. Multiple benign appearing hemangioma is at T12, L1, and L2. 4. Scoliosis. 5. Moderate right foraminal stenosis at L1-2. 6. Mild central and bilateral foraminal narrowing at L2-3. 7. Some reticular narrowing is worse on the left at L3-4 with mild to moderate foraminal stenosis worse on the right. 8. Mild central and left foraminal stenosis at L4-5.   Electronically Signed   By: San Morelle M.D.   On: 03/22/2014 12:19   Nm Pet Image Initial (pi) Whole Body  03/16/2014   CLINICAL DATA:  Initial treatment strategy for multiple myeloma.  EXAM: NUCLEAR MEDICINE PET WHOLE BODY  TECHNIQUE: Twelve mCi F-18 FDG was injected intravenously. Full-ring PET imaging was performed from the vertex to the feet after the radiotracer. CT data was obtained and used for attenuation correction and anatomic localization.  FASTING BLOOD GLUCOSE:  Value:  One hundred two mg/dl  COMPARISON:  Multiple exams, including 02/20/2014 and 11/30/2012  RADIOPHARMACEUTICALS:  Twelve mCi Technetium-99 MDP   FINDINGS: Head/Neck: 1.1 cm lytic lesion in the right frontotemporal bone region, probably with faintly increased metabolic activity, but not readily measure due to the proximity of the adjacent high a brain activity.  Chest: There is an unusual degree of reduced activity in the left ventricular myocardial apex and adjacent septum, enough to raise suspicion for prior myocardial infarction. Correlate with patient history.  Abdomen/Pelvis: Diffuse uptake in the descending and sigmoid colon in a region of significant diverticulosis is present, and extends down into the rectum. Given the diffuse nature of this activity, and is ascribed to physiologic etiology.  Enlarged prostate gland. Small umbilical hernia contains adipose tissue. Aortoiliac atherosclerotic vascular disease.  Skeleton: A variety of hypermetabolic bony lesions are observed compatible with myeloma.  Among these are a right second rib lesion with maximum standard uptake value 4.2; a left posterior glenoid lesion with maximum standard uptake value 4.1; an expansile right fifth rib primarily lytic lesion with maximum standard uptake value 3.0 ; a left L3 lytic lesion measuring 2.7 cm in diameter on image 168 of series 3 with maximum standard uptake value of 9.1 and with epidural extension in the left anterior lateral recess ; an expansile right sacral lesion with maximum standard uptake value 8.1 ; a left lower lytic sacral lesion with maximum standard uptake value 8.3 ; and a left mid femoral shaft lesion with maximum standard uptake value 2.7.  Several other smaller scattered lesions are present, such as the small lytic lesion in the left lateral clavicle on image 70 of series 3.  IMPRESSION: 1. Scattered hypermetabolic osseous lesions, including the calvarium, ribs, left scapula, sacrum, and left femoral shaft. An expansile left vertebral body lesion at L3 extends into the epidural space of the left lateral recess, and probably merits lumbar spine MRI  assessment. 2. Reduced activity in the left ventricular myocardial apex and adjacent septum, suspicious for prior myocardial infarction. 3. Physiologic activity in the distal colon. Descending and sigmoid colon diverticulosis. 4.  Aortoiliac atherosclerotic vascular disease. 5. Enlarged prostate gland.   Electronically Signed   By: Van Clines M.D.   On: 03/16/2014 14:56   Dg Hand Complete Right  03/24/2014   CLINICAL DATA:  RIGHT hand pain and swelling for several days. No reported injury.  EXAM: RIGHT HAND - COMPLETE 3+ VIEW  COMPARISON:  None.  FINDINGS: There is no evidence of fracture or dislocation. There  is no evidence of arthropathy or other focal bone abnormality. Mild diffuse soft tissue swelling without visible foreign body or laceration.  IMPRESSION: No osseous abnormality.  Mild diffuse soft tissue swelling.   Electronically Signed   By: Rolla Flatten M.D.   On: 03/24/2014 11:35    Impression: The patient has received good benefit from his palliative radiation therapy directed at the right chest wall area. On recent imaging he has significant disease in the lumbar spine and I would recommend a short course of radiation therapy directed at the L2/L3 area. The cortex of the left femur seems to be intact on his PET scan and I'll hold off on treatment to this area. If the patient however does develop pain in this region he may require radiation to this area.  I discussed the treatment course side effects and potential toxicities of radiation therapy directed at the lumbar spine with the patient. He appears to understand and wishes to proceed with planned course of treatment. Anticipate approximately 10 treatments  Plan:  Simulation and planning on March 9 for treatments directed at the lumbar spine  ____________________________________ Blair Promise, MD

## 2014-04-06 NOTE — Progress Notes (Signed)
Dean [name] here for follow up after treatment to his right posterior chest wall.  He denies pain now but has noticed pain in his right hip area that started on Friday.  He is taking percocet as needed and took one this morning.  He is currently taking revlimid (daily), velcade (weekly), decadron 40 mg weekly and Zometa (monthly).  He denies having a cough or any skin irritation.  He reports that his taste buds are gone.  His weight is stable from 03/30/14.  He reports that he is weak.    BP 136/70 mmHg  Pulse 67  Temp(Src) 97.4 F (36.3 C) (Oral)  Resp 16  Ht 5\' 8"  (1.727 m)  Wt 233 lb 1.6 oz (105.733 kg)  BMI 35.45 kg/m2  SpO2 97%

## 2014-04-07 ENCOUNTER — Encounter (HOSPITAL_COMMUNITY): Payer: Medicare Other | Attending: Hematology and Oncology

## 2014-04-07 ENCOUNTER — Encounter (HOSPITAL_BASED_OUTPATIENT_CLINIC_OR_DEPARTMENT_OTHER): Payer: Medicare Other | Admitting: Oncology

## 2014-04-07 ENCOUNTER — Ambulatory Visit (HOSPITAL_COMMUNITY): Payer: Medicare Other | Admitting: Hematology & Oncology

## 2014-04-07 ENCOUNTER — Inpatient Hospital Stay (HOSPITAL_COMMUNITY): Payer: Medicare Other

## 2014-04-07 ENCOUNTER — Encounter (HOSPITAL_BASED_OUTPATIENT_CLINIC_OR_DEPARTMENT_OTHER): Payer: Medicare Other

## 2014-04-07 VITALS — BP 136/62 | HR 79 | Temp 97.5°F | Resp 18 | Wt 234.6 lb

## 2014-04-07 DIAGNOSIS — R634 Abnormal weight loss: Secondary | ICD-10-CM | POA: Insufficient documentation

## 2014-04-07 DIAGNOSIS — C9000 Multiple myeloma not having achieved remission: Secondary | ICD-10-CM

## 2014-04-07 DIAGNOSIS — Z923 Personal history of irradiation: Secondary | ICD-10-CM | POA: Insufficient documentation

## 2014-04-07 DIAGNOSIS — G4700 Insomnia, unspecified: Secondary | ICD-10-CM

## 2014-04-07 DIAGNOSIS — R630 Anorexia: Secondary | ICD-10-CM | POA: Diagnosis not present

## 2014-04-07 DIAGNOSIS — M899 Disorder of bone, unspecified: Secondary | ICD-10-CM | POA: Diagnosis not present

## 2014-04-07 DIAGNOSIS — Z5112 Encounter for antineoplastic immunotherapy: Secondary | ICD-10-CM | POA: Diagnosis not present

## 2014-04-07 DIAGNOSIS — N400 Enlarged prostate without lower urinary tract symptoms: Secondary | ICD-10-CM | POA: Diagnosis not present

## 2014-04-07 DIAGNOSIS — M542 Cervicalgia: Secondary | ICD-10-CM | POA: Insufficient documentation

## 2014-04-07 DIAGNOSIS — D472 Monoclonal gammopathy: Secondary | ICD-10-CM | POA: Insufficient documentation

## 2014-04-07 DIAGNOSIS — K219 Gastro-esophageal reflux disease without esophagitis: Secondary | ICD-10-CM | POA: Diagnosis not present

## 2014-04-07 LAB — CBC WITH DIFFERENTIAL/PLATELET
Basophils Absolute: 0 10*3/uL (ref 0.0–0.1)
Basophils Relative: 0 % (ref 0–1)
Eosinophils Absolute: 1 10*3/uL — ABNORMAL HIGH (ref 0.0–0.7)
Eosinophils Relative: 1 % (ref 0–5)
HCT: 42.2 % (ref 39.0–52.0)
Hemoglobin: 14.1 g/dL (ref 13.0–17.0)
Lymphocytes Relative: 16 % (ref 12–46)
Lymphs Abs: 1 10*3/uL (ref 0.7–4.0)
MCH: 30.3 pg (ref 26.0–34.0)
MCHC: 33.4 g/dL (ref 30.0–36.0)
MCV: 90.6 fL (ref 78.0–100.0)
Monocytes Absolute: 0.4 10*3/uL (ref 0.1–1.0)
Monocytes Relative: 7 % (ref 3–12)
Neutro Abs: 5 10*3/uL (ref 1.7–7.7)
Neutrophils Relative %: 75 % (ref 43–77)
Platelets: 171 10*3/uL (ref 150–400)
RBC: 4.66 MIL/uL (ref 4.22–5.81)
RDW: 14.2 % (ref 11.5–15.5)
WBC: 6.7 10*3/uL (ref 4.0–10.5)

## 2014-04-07 LAB — LACTATE DEHYDROGENASE: LDH: 115 U/L (ref 94–250)

## 2014-04-07 LAB — COMPREHENSIVE METABOLIC PANEL
ALT: 13 U/L (ref 0–53)
AST: 13 U/L (ref 0–37)
Albumin: 3.2 g/dL — ABNORMAL LOW (ref 3.5–5.2)
Alkaline Phosphatase: 33 U/L — ABNORMAL LOW (ref 39–117)
Anion gap: 5 (ref 5–15)
BUN: 15 mg/dL (ref 6–23)
CO2: 26 mmol/L (ref 19–32)
Calcium: 8.7 mg/dL (ref 8.4–10.5)
Chloride: 107 mmol/L (ref 96–112)
Creatinine, Ser: 0.69 mg/dL (ref 0.50–1.35)
GFR calc Af Amer: >90 mL/min (ref >90)
GFR calc non Af Amer: 90 mL/min — ABNORMAL LOW (ref >90)
Glucose, Bld: 88 mg/dL (ref 70–99)
Potassium: 4.3 mmol/L (ref 3.5–5.1)
Sodium: 138 mmol/L (ref 135–145)
Total Bilirubin: 0.3 mg/dL (ref 0.3–1.2)
Total Protein: 6.4 g/dL (ref 6.0–8.3)

## 2014-04-07 LAB — SEDIMENTATION RATE: Sed Rate: 15 mm/hr (ref 0–16)

## 2014-04-07 MED ORDER — ONDANSETRON HCL 4 MG PO TABS
ORAL_TABLET | ORAL | Status: AC
  Filled 2014-04-07: qty 2

## 2014-04-07 MED ORDER — BORTEZOMIB CHEMO IV INJECTION 3.5 MG: 1.3000 mg/m2 | Freq: Once | INTRAMUSCULAR | Status: DC

## 2014-04-07 MED ORDER — ONDANSETRON HCL 4 MG PO TABS: 8.0000 mg | ORAL_TABLET | Freq: Once | ORAL | Status: DC

## 2014-04-07 MED ORDER — ALPRAZOLAM 1 MG PO TABS: 1.0000 mg | ORAL_TABLET | Freq: Every evening | ORAL | Status: DC | PRN

## 2014-04-07 MED ORDER — ONDANSETRON HCL 4 MG PO TABS
8.0000 mg | ORAL_TABLET | Freq: Once | ORAL | Status: AC
  Administered 2014-04-07: 8 mg via ORAL

## 2014-04-07 MED ORDER — ONDANSETRON HCL 4 MG PO TABS
ORAL_TABLET | ORAL | Status: AC
  Filled 2014-04-07: qty 1

## 2014-04-07 MED ORDER — BORTEZOMIB CHEMO SQ INJECTION 3.5 MG (2.5MG/ML)
1.3000 mg/m2 | Freq: Once | INTRAMUSCULAR | Status: AC
  Administered 2014-04-07: 3 mg via SUBCUTANEOUS
  Filled 2014-04-07: qty 3

## 2014-04-07 NOTE — Assessment & Plan Note (Signed)
S/P Day 1 and 4 of cycle 1 of VD.  He had edema of fingers #2 and #3 which the patient thought was from [place].  Due to his concerns, we held day 8 and 11 of cycle 1.  In the interim, we got Revlimid approved after insurance company denied this standard of care of treatment.  Therefore, day 8 and 11 of cycle 1 are cancelled/deleted in lieu of moving on to RVD.  He will start Revlimid on day 1 of cycle 1 (today).  He is currently on ASA 325 mg daily and he is to continue with this prophylaxis intervention.  All questions are answered regarding the regimen.  He will continue with weekly Dexamethasone.  Return in 2 weeks for follow-up to evaluate tolerability of treatment.

## 2014-04-07 NOTE — Progress Notes (Signed)
Dean Fraction, MD [place] [address]  Multiple myeloma without remission  Insomnia - Plan: ALPRAZolam (XANAX) 1 MG tablet  CURRENT THERAPY: Beginning RVD with Velcade being given in a day 1, 8, 15 fashion every 21 days, Revlimid 25 mg daily 14 days on and 7 days off, and Dexamethasone 40 mg daily. S/P XRT to rib lesion.  INTERVAL HISTORY: Dean [name] 79 y.o. male returns for followup of multiple myeloma, monoclonal IgG kappa with scattered osseous lesions.    Multiple myeloma without remission   11/16/2012 Initial Diagnosis L occipital condyle destructive lesion, not biopsied secondary to location. XRT   12/01/2012 Imaging L3 superior endplate compression FX, no lytic lesions to suggest myeloma   02/06/2014 Imaging soft tissue mass along the right posterior fifth rib with some rib destruction   02/16/2014 Miscellaneous Normal CBC, Normal CMP, kappa,lamda ratio of 2.62 (abnormal), UIEP with slightly restricted band, IgG kappa, SPEP/IEP with monoclonal protein at 0.79 g/dl, normal igG, suppressed IGM   02/20/2014 Bone Marrow Biopsy Normal cytogenetics and myeloma FISH profile.   02/21/2014 - 03/08/2014 Radiation Therapy 30Gy to R rib lesion, lesion not biopsied   03/16/2014 PET scan Scattered hypermetabolic osseous lesions, including the calvarium, ribs, left scapula, sacrum, and left femoral shaft. An expansile left vertebral body lesion at L3 extends into the epidural space of the left lateral recess,    03/17/2014 - 04/07/2014 Chemotherapy Velcade and Dexamethasone due to initial denial of Revlimid by insurance company.  Zometa monthly.   03/22/2014 Imaging MR_ L-Spine- Enhancing lesions compatible with multiple myeloma at L2, L3, and S1-2.   04/07/2014 -  Chemotherapy    I personally reviewed and went over laboratory results with the patient.  The results are noted within this dictation.  He hs ready for therapy today.  He is going to start Revlimid  capsules tonight as recommended by specialty pharmacy.   He reports that Ambien was not effective for him for sleeping.  He reports that he took 2 Xanaxs last night and he notes a decent nights sleep.  He reports that he is satisfied with his sleep last night.  He is not interested in trying another sleeping aid at this time (namel Doxepin).    Hematologically, he denies any complaints and ROS questioning is negative.  Past Medical History  Diagnosis Date  . Neuropathy   . BPH (benign prostatic hyperplasia)   . GERD (gastroesophageal reflux disease)   . H/O ETOH abuse   . Skull lesion     Left occipital condyle  . History of radiation therapy 01/05/13-02/10/13    45 gray to left occipital condyle region  . Multiple myeloma   . Radiation 02/21/14-03/08/14    right posterior chest wall area 30 gray    has Neuropathy; BPH (benign prostatic hyperplasia); GERD (gastroesophageal reflux disease); H/O ETOH abuse; Skull lesion; Neck pain; Fatigue; Loss of weight; Insomnia; Allergic rhinitis; Lumbar compression fracture; Dermatitis; and Multiple myeloma without remission on his problem list.     is allergic to codeine and morphine and related.  Mr. Albrecht had no medications administered during this visit.  Past Surgical History  Procedure Laterality Date  . Cyst excision  1959    tail bone    Denies any headaches, dizziness, double vision, fevers, chills, night sweats, nausea, vomiting, diarrhea, constipation, chest pain, heart palpitations, shortness of breath, blood in stool, black tarry stool, urinary pain, urinary burning, urinary frequency,  hematuria.   PHYSICAL EXAMINATION  ECOG PERFORMANCE STATUS: 0 - Asymptomatic  Filed Vitals:   04/07/14 1000  BP: 136/62  Pulse: 79  Temp: 97.5 F (36.4 C)  Resp: 18    GENERAL:alert, no distress, well nourished, well developed, comfortable, cooperative, obese, smiling and accompanied by his wife. SKIN: skin color, texture, turgor are normal,  no rashes or significant lesions HEAD: Normocephalic, No masses, lesions, tenderness or abnormalities EYES: normal, PERRLA, EOMI, Conjunctiva are pink and non-injected EARS: External ears normal OROPHARYNX:lips, buccal mucosa, and tongue normal and mucous membranes are moist  NECK: supple, trachea midline LYMPH:  not examined BREAST:not examined LUNGS: not examined HEART: not examined ABDOMEN:obese BACK: Back symmetric, no curvature. EXTREMITIES:less then 2 second capillary refill, no joint deformities, effusion, or inflammation, no cyanosis  NEURO: alert & oriented x 3 with fluent speech, no focal motor/sensory deficits, gait normal  LABORATORY DATA: CBC    Component Value Date/Time   WBC 6.7 04/07/2014 1003   RBC 4.66 04/07/2014 1003   HGB 14.1 04/07/2014 1003   HCT 42.2 04/07/2014 1003   PLT 171 04/07/2014 1003   MCV 90.6 04/07/2014 1003   MCH 30.3 04/07/2014 1003   MCHC 33.4 04/07/2014 1003   RDW 14.2 04/07/2014 1003   LYMPHSABS 1.0 04/07/2014 1003   MONOABS 0.4 04/07/2014 1003   EOSABS 1.0* 04/07/2014 1003   BASOSABS 0.0 04/07/2014 1003      Chemistry      Component Value Date/Time   NA 138 04/07/2014 0949   K 4.3 04/07/2014 0949   CL 107 04/07/2014 0949   CO2 26 04/07/2014 0949   BUN 15 04/07/2014 0949   CREATININE 0.69 04/07/2014 0949   CREATININE 0.76 02/06/2014 1249      Component Value Date/Time   CALCIUM 8.7 04/07/2014 0949   ALKPHOS 33* 04/07/2014 0949   AST 13 04/07/2014 0949   ALT 13 04/07/2014 0949   BILITOT 0.3 04/07/2014 0949       RADIOGRAPHIC STUDIES:  Mr Lumbar Spine W Wo Contrast  03/22/2014   CLINICAL DATA:  Multiple myeloma.  Abnormal PET scan.  EXAM: MRI LUMBAR SPINE WITHOUT AND WITH CONTRAST  TECHNIQUE: Multiplanar and multiecho pulse sequences of the lumbar spine were obtained without and with intravenous contrast.  CONTRAST:  41m MULTIHANCE GADOBENATE DIMEGLUMINE 529 MG/ML IV SOLN  COMPARISON:  PET scan 03/16/2014. Lumbar spine  radiographs 01/02/2014. CT of the abdomen and pelvis 11/30/2012.  FINDINGS: Normal signal is present in the conus medullaris which terminates at T12-L1, within normal limits. Benign appearing hemangiomas are noted at T12 and L1. Rightward curvature of the lumbar spine is centered at L3-4. A superior endplate fracture at L4 is mostly healed, present on the plain film radiographs of 01/02/2014. There is some enhancement within the L4 vertebral body which could be related to the multiple myeloma.  Enhancing mass lesion in the posterior body of the health posterior L3 body extends into the left pedicle. The lesion measures 2.4 x 2.8 cm. The smaller lesion within the posterior central L2 vertebral body near the superior endplate measures 1.3 x 1.5 x 1.1 cm. A hemangioma is noted on the left at L2.  Enhancing lesion involves the right paramedian sacrum at the S1-2 level. This spans the disc space without significant extraosseous extension.  Limited imaging of the abdomen is unremarkable. There is no significant adenopathy.  L1-2: A rightward disc protrusion and endplate osteophyte formation results in moderate right foraminal stenosis. The central canal and left foramen are  patent.  L2-3: A broad-based disc protrusion is present. There is mild central canal narrowing. Mild foraminal narrowing is present bilaterally.  L3-4: A leftward disc protrusion is present. There is mild subarticular stenosis, worse on the left. Facet hypertrophy contributes to mild to moderate foraminal narrowing bilaterally, slightly worse on the right.  L4-5: A broad-based disc protrusion is present. Mild facet hypertrophy is noted bilaterally. Mild central and left foraminal stenosis is evident.  L5-S1: A shallow broad-based disc protrusion is present without significant stenosis.  IMPRESSION: 1. Enhancing lesions compatible with multiple myeloma at L2, L3, and S1-2. 2. Nearly healed superior endplate L4 compression fracture with some residual  enhancement centrally. This could represent another lesion of multiple myeloma but appears to have different signal characteristics it is more likely related to the healing fracture. Will 3. Multiple benign appearing hemangioma is at T12, L1, and L2. 4. Scoliosis. 5. Moderate right foraminal stenosis at L1-2. 6. Mild central and bilateral foraminal narrowing at L2-3. 7. Some reticular narrowing is worse on the left at L3-4 with mild to moderate foraminal stenosis worse on the right. 8. Mild central and left foraminal stenosis at L4-5.   Electronically Signed   By: San Morelle M.D.   On: 03/22/2014 12:19   Nm Pet Image Initial (pi) Whole Body  03/16/2014   CLINICAL DATA:  Initial treatment strategy for multiple myeloma.  EXAM: NUCLEAR MEDICINE PET WHOLE BODY  TECHNIQUE: Twelve mCi F-18 FDG was injected intravenously. Full-ring PET imaging was performed from the vertex to the feet after the radiotracer. CT data was obtained and used for attenuation correction and anatomic localization.  FASTING BLOOD GLUCOSE:  Value:  One hundred two mg/dl  COMPARISON:  Multiple exams, including 02/20/2014 and 11/30/2012  RADIOPHARMACEUTICALS:  Twelve mCi Technetium-99 MDP  FINDINGS: Head/Neck: 1.1 cm lytic lesion in the right frontotemporal bone region, probably with faintly increased metabolic activity, but not readily measure due to the proximity of the adjacent high a brain activity.  Chest: There is an unusual degree of reduced activity in the left ventricular myocardial apex and adjacent septum, enough to raise suspicion for prior myocardial infarction. Correlate with patient history.  Abdomen/Pelvis: Diffuse uptake in the descending and sigmoid colon in a region of significant diverticulosis is present, and extends down into the rectum. Given the diffuse nature of this activity, and is ascribed to physiologic etiology.  Enlarged prostate gland. Small umbilical hernia contains adipose tissue. Aortoiliac  atherosclerotic vascular disease.  Skeleton: A variety of hypermetabolic bony lesions are observed compatible with myeloma.  Among these are a right second rib lesion with maximum standard uptake value 4.2; a left posterior glenoid lesion with maximum standard uptake value 4.1; an expansile right fifth rib primarily lytic lesion with maximum standard uptake value 3.0 ; a left L3 lytic lesion measuring 2.7 cm in diameter on image 168 of series 3 with maximum standard uptake value of 9.1 and with epidural extension in the left anterior lateral recess ; an expansile right sacral lesion with maximum standard uptake value 8.1 ; a left lower lytic sacral lesion with maximum standard uptake value 8.3 ; and a left mid femoral shaft lesion with maximum standard uptake value 2.7.  Several other smaller scattered lesions are present, such as the small lytic lesion in the left lateral clavicle on image 70 of series 3.  IMPRESSION: 1. Scattered hypermetabolic osseous lesions, including the calvarium, ribs, left scapula, sacrum, and left femoral shaft. An expansile left vertebral body lesion at L3  extends into the epidural space of the left lateral recess, and probably merits lumbar spine MRI assessment. 2. Reduced activity in the left ventricular myocardial apex and adjacent septum, suspicious for prior myocardial infarction. 3. Physiologic activity in the distal colon. Descending and sigmoid colon diverticulosis. 4.  Aortoiliac atherosclerotic vascular disease. 5. Enlarged prostate gland.   Electronically Signed   By: Van Clines M.D.   On: 03/16/2014 14:56   Dg Hand Complete Right  03/24/2014   CLINICAL DATA:  RIGHT hand pain and swelling for several days. No reported injury.  EXAM: RIGHT HAND - COMPLETE 3+ VIEW  COMPARISON:  None.  FINDINGS: There is no evidence of fracture or dislocation. There is no evidence of arthropathy or other focal bone abnormality. Mild diffuse soft tissue swelling without visible foreign  body or laceration.  IMPRESSION: No osseous abnormality.  Mild diffuse soft tissue swelling.   Electronically Signed   By: Rolla Flatten M.D.   On: 03/24/2014 11:35     ASSESSMENT AND PLAN:  Multiple myeloma without remission S/P Day 1 and 4 of cycle 1 of VD.  He had edema of fingers #2 and #3 which the patient thought was from [place].  Due to his concerns, we held day 8 and 11 of cycle 1.  In the interim, we got Revlimid approved after insurance company denied this standard of care of treatment.  Therefore, day 8 and 11 of cycle 1 are cancelled/deleted in lieu of moving on to RVD.  He will start Revlimid on day 1 of cycle 1 (today).  He is currently on ASA 325 mg daily and he is to continue with this prophylaxis intervention.  All questions are answered regarding the regimen.  He will continue with weekly Dexamethasone.  Return in 2 weeks for follow-up to evaluate tolerability of treatment.   Insomnia Not improved with Ambien.  Better with 2 mg of Xanax.  Rx printed today for #60 with 2 refills.    THERAPY PLAN:  He will continue with RVD and we will evaluate for side effects of therapy.   All questions were answered. The patient knows to call the clinic with any problems, questions or concerns. We can certainly see the patient much sooner if necessary.  Patient and plan discussed with Dr. Ancil Linsey and she is in agreement with the aforementioned.   This note is electronically signed by: Robynn Pane 04/07/2014 12:04 PM

## 2014-04-07 NOTE — Progress Notes (Signed)
Labs for kllc,mm,[id number],cbcd,cmp,esr

## 2014-04-07 NOTE — Assessment & Plan Note (Addendum)
Not improved with Ambien.  Better with 2 mg of Xanax.  Rx printed today for #60 with 2 refills.

## 2014-04-07 NOTE — Patient Instructions (Addendum)
[facility] Discharge Instructions for Patients Receiving Chemotherapy  Today you received the following chemotherapy agents velcade injection You can take xanax 1-2 tabs at bedtime To help prevent nausea and vomiting after your treatment, we encourage you to take your nausea medication   If you develop nausea and vomiting that is not controlled by your nausea medication, call the clinic. If it is after clinic hours your family physician or the after hours number for the clinic or go to the Emergency Department.   BELOW ARE SYMPTOMS THAT SHOULD BE REPORTED IMMEDIATELY:  *FEVER GREATER THAN 101.0 F  *CHILLS WITH OR WITHOUT FEVER  NAUSEA AND VOMITING THAT IS NOT CONTROLLED WITH YOUR NAUSEA MEDICATION  *UNUSUAL SHORTNESS OF BREATH  *UNUSUAL BRUISING OR BLEEDING  TENDERNESS IN MOUTH AND THROAT WITH OR WITHOUT PRESENCE OF ULCERS  *URINARY PROBLEMS  *BOWEL PROBLEMS  UNUSUAL RASH Items with * indicate a potential emergency and should be followed up as soon as possible.  One of the nurses will contact you 24 hours after your treatment. Please let the nurse know about any problems that you may have experienced. Feel free to call the clinic you have any questions or concerns. The clinic phone number is [phone].   I have been informed and understand all the instructions given to me. I know to contact the clinic, my physician, or go to the Emergency Department if any problems should occur. I do not have any questions at this time, but understand that I may call the clinic during office hours or the Patient Navigator at [phone] should I have any questions or need assistance in obtaining follow up care.

## 2014-04-07 NOTE — Progress Notes (Signed)
Dean [name] Tolerated velcade sq in right abdomen.  Discharged ambulatory

## 2014-04-08 LAB — BETA 2 MICROGLOBULIN, SERUM: Beta-2 Microglobulin: 1.9 mg/L (ref 0.6–2.4)

## 2014-04-09 LAB — KAPPA/LAMBDA LIGHT CHAINS
Kappa free light chain: 38.37 mg/L — ABNORMAL HIGH (ref 3.30–19.40)
Kappa, lambda light chain ratio: 1.5 (ref 0.26–1.65)
Lambda free light chains: 25.61 mg/L (ref 5.71–26.30)

## 2014-04-10 ENCOUNTER — Inpatient Hospital Stay (HOSPITAL_COMMUNITY): Payer: Medicare Other

## 2014-04-10 ENCOUNTER — Telehealth: Payer: Self-pay | Admitting: Oncology

## 2014-04-10 NOTE — Telephone Encounter (Signed)
Called [facility] regarding the refill request for alprazolam received from [facility].  This was filled by Dr. Whitney Muse on 04/07/14.  Was not able to leave a message and will keep trying to reach him.

## 2014-04-11 LAB — MULTIPLE MYELOMA PANEL, SERUM
Albumin ELP: 52.2 % — ABNORMAL LOW (ref 55.8–66.1)
Alpha-1-Globulin: 6.7 % — ABNORMAL HIGH (ref 2.9–4.9)
Alpha-2-Globulin: 15.4 % — ABNORMAL HIGH (ref 7.1–11.8)
Beta 2: 5.3 % (ref 3.2–6.5)
Beta Globulin: 6 % (ref 4.7–7.2)
Gamma Globulin: 14.4 % (ref 11.1–18.8)
IgA: 234 mg/dL (ref 68–379)
IgG (Immunoglobin G), Serum: 958 mg/dL (ref 650–1600)
IgM, Serum: 23 mg/dL — ABNORMAL LOW (ref 41–251)
M-Spike, %: 0.45 g/dL
Total Protein: 6.3 g/dL (ref 6.0–8.3)

## 2014-04-12 ENCOUNTER — Ambulatory Visit
Admission: RE | Admit: 2014-04-12 | Discharge: 2014-04-12 | Disposition: A | Discharge status: Home / Self Care | Payer: Medicare Other | Source: Ambulatory Visit | Attending: Radiation Oncology | Admitting: Radiation Oncology

## 2014-04-12 ENCOUNTER — Other Ambulatory Visit (HOSPITAL_COMMUNITY): Payer: Self-pay | Admitting: Oncology

## 2014-04-12 DIAGNOSIS — C9000 Multiple myeloma not having achieved remission: Secondary | ICD-10-CM | POA: Diagnosis not present

## 2014-04-12 MED ORDER — LENALIDOMIDE 25 MG PO CAPS: 25.0000 mg | ORAL_CAPSULE | Freq: Every day | ORAL | Status: DC

## 2014-04-12 NOTE — Progress Notes (Signed)
  Radiation Oncology         [phone] ________________________________  Name: Dean [name] MRN: [id number]  Date: 04/12/2014  DOB: Oct 10, 1935  SIMULATION AND TREATMENT PLANNING NOTE    ICD-9-CM ICD-10-CM   1. Multiple myeloma without remission 203.00 C90.00     DIAGNOSIS:  Multiple myeloma involving the lumbar spine  NARRATIVE:  The patient was brought to the [place] suite.  Identity was confirmed.  All relevant records and images related to the planned course of therapy were reviewed.  The patient freely provided informed written consent to proceed with treatment after reviewing the details related to the planned course of therapy. The consent form was witnessed and verified by the simulation staff.  Then, the patient was set-up in a stable reproducible  supine position for radiation therapy.  CT images were obtained.  Surface markings were placed.  The CT images were loaded into the planning software.  Then the target and avoidance structures were contoured.  Treatment planning then occurred.  The radiation prescription was entered and confirmed.  Then, I designed and supervised the construction of a total of 3 medically necessary complex treatment devices.  I have requested : 3D Simulation  I have requested a DVH of the following structures: GTV, kidneys, spinal cord.  I have ordered:dose calc.  PLAN:  The patient will receive 25 Gy in 10 fractions.  ________________________________  -----------------------------------  Blair Promise, PhD, MD

## 2014-04-13 ENCOUNTER — Telehealth (HOSPITAL_COMMUNITY): Payer: Self-pay

## 2014-04-13 NOTE — Telephone Encounter (Signed)
Call from daughter, Hinton Dyer stating that 2 days ago Mr. Fennema had a few bumps to show up on his arms.  Yesterday and today rash has spread and both arms are covered and itching.  Used cream that radiation gave him and has taken benadryl 1 tablet last night and another one this am.  Discussed with PA and patient is to take Benadryl 1 - 2 every 6 hours as needed for itching and to hold the Revlimid for now.  Instructed that he should also go to nearest Emergency Room if he experiences any shortness of breath, swelling in his throat, etc.  Verbalized understanding of instructions.

## 2014-04-14 ENCOUNTER — Encounter (HOSPITAL_BASED_OUTPATIENT_CLINIC_OR_DEPARTMENT_OTHER): Payer: Medicare Other

## 2014-04-14 ENCOUNTER — Ambulatory Visit (HOSPITAL_COMMUNITY): Payer: Medicare Other | Admitting: Hematology & Oncology

## 2014-04-14 ENCOUNTER — Inpatient Hospital Stay (HOSPITAL_COMMUNITY): Payer: Medicare Other

## 2014-04-14 ENCOUNTER — Encounter (HOSPITAL_COMMUNITY): Payer: Medicare Other

## 2014-04-14 ENCOUNTER — Other Ambulatory Visit (HOSPITAL_COMMUNITY): Payer: Medicare Other

## 2014-04-14 DIAGNOSIS — C9000 Multiple myeloma not having achieved remission: Secondary | ICD-10-CM | POA: Diagnosis not present

## 2014-04-14 DIAGNOSIS — K219 Gastro-esophageal reflux disease without esophagitis: Secondary | ICD-10-CM | POA: Diagnosis not present

## 2014-04-14 DIAGNOSIS — R630 Anorexia: Secondary | ICD-10-CM | POA: Diagnosis not present

## 2014-04-14 DIAGNOSIS — N400 Enlarged prostate without lower urinary tract symptoms: Secondary | ICD-10-CM | POA: Diagnosis not present

## 2014-04-14 DIAGNOSIS — Z5112 Encounter for antineoplastic immunotherapy: Secondary | ICD-10-CM | POA: Diagnosis not present

## 2014-04-14 DIAGNOSIS — D472 Monoclonal gammopathy: Secondary | ICD-10-CM | POA: Diagnosis not present

## 2014-04-14 DIAGNOSIS — R634 Abnormal weight loss: Secondary | ICD-10-CM | POA: Diagnosis not present

## 2014-04-14 DIAGNOSIS — M899 Disorder of bone, unspecified: Secondary | ICD-10-CM | POA: Diagnosis not present

## 2014-04-14 LAB — COMPREHENSIVE METABOLIC PANEL
ALT: 13 U/L (ref 0–53)
AST: 15 U/L (ref 0–37)
Albumin: 3.3 g/dL — ABNORMAL LOW (ref 3.5–5.2)
Alkaline Phosphatase: 31 U/L — ABNORMAL LOW (ref 39–117)
Anion gap: 7 (ref 5–15)
BUN: 14 mg/dL (ref 6–23)
CO2: 26 mmol/L (ref 19–32)
Calcium: 9.2 mg/dL (ref 8.4–10.5)
Chloride: 103 mmol/L (ref 96–112)
Creatinine, Ser: 0.72 mg/dL (ref 0.50–1.35)
GFR calc Af Amer: >90 mL/min (ref >90)
GFR calc non Af Amer: 88 mL/min — ABNORMAL LOW (ref >90)
Glucose, Bld: 104 mg/dL — ABNORMAL HIGH (ref 70–99)
Potassium: 4.2 mmol/L (ref 3.5–5.1)
Sodium: 136 mmol/L (ref 135–145)
Total Bilirubin: 0.9 mg/dL (ref 0.3–1.2)
Total Protein: 6.4 g/dL (ref 6.0–8.3)

## 2014-04-14 LAB — CBC WITH DIFFERENTIAL/PLATELET
Basophils Absolute: 0 10*3/uL (ref 0.0–0.1)
Basophils Relative: 0 % (ref 0–1)
Eosinophils Absolute: 0 10*3/uL (ref 0.0–0.7)
Eosinophils Relative: 0 % (ref 0–5)
HCT: 42.7 % (ref 39.0–52.0)
Hemoglobin: 14.4 g/dL (ref 13.0–17.0)
Lymphocytes Relative: 5 % — ABNORMAL LOW (ref 12–46)
Lymphs Abs: 0.5 10*3/uL — ABNORMAL LOW (ref 0.7–4.0)
MCH: 30.3 pg (ref 26.0–34.0)
MCHC: 33.7 g/dL (ref 30.0–36.0)
MCV: 89.7 fL (ref 78.0–100.0)
Monocytes Absolute: 0.2 10*3/uL (ref 0.1–1.0)
Monocytes Relative: 2 % — ABNORMAL LOW (ref 3–12)
Neutro Abs: 8.6 10*3/uL — ABNORMAL HIGH (ref 1.7–7.7)
Neutrophils Relative %: 92 % — ABNORMAL HIGH (ref 43–77)
Platelets: 113 10*3/uL — ABNORMAL LOW (ref 150–400)
RBC: 4.76 MIL/uL (ref 4.22–5.81)
RDW: 14.2 % (ref 11.5–15.5)
WBC: 9.3 10*3/uL (ref 4.0–10.5)

## 2014-04-14 MED ORDER — SODIUM CHLORIDE 0.9 % IV SOLN
Freq: Once | INTRAVENOUS | Status: AC
  Administered 2014-04-14: 13:00:00 via INTRAVENOUS

## 2014-04-14 MED ORDER — ZOLEDRONIC ACID 4 MG/5ML IV CONC
4.0000 mg | Freq: Once | INTRAVENOUS | Status: AC
  Administered 2014-04-14: 4 mg via INTRAVENOUS
  Filled 2014-04-14: qty 5

## 2014-04-14 MED ORDER — SODIUM CHLORIDE 0.9 % IJ SOLN
10.0000 mL | INTRAMUSCULAR | Status: DC | PRN
  Administered 2014-04-14: 10 mL
  Filled 2014-04-14: qty 10

## 2014-04-14 MED ORDER — METHYLPREDNISOLONE 4 MG PO KIT: PACK | ORAL | Status: DC

## 2014-04-14 MED ORDER — ONDANSETRON HCL 4 MG PO TABS
8.0000 mg | ORAL_TABLET | Freq: Once | ORAL | Status: AC
  Administered 2014-04-14: 8 mg via ORAL
  Filled 2014-04-14: qty 2

## 2014-04-14 MED ORDER — BORTEZOMIB CHEMO SQ INJECTION 3.5 MG (2.5MG/ML)
1.3000 mg/m2 | Freq: Once | INTRAMUSCULAR | Status: AC
  Administered 2014-04-14: 3 mg via SUBCUTANEOUS
  Filled 2014-04-14: qty 3

## 2014-04-14 NOTE — Patient Instructions (Signed)
[facility] Discharge Instructions for Patients Receiving Chemotherapy  Today you received the following chemotherapy agents Velcade as ordered. You also received a Zometa infusion. Continue to [place]. A prescription for a steroid dose pack was sent to your pharmacy. Take this as directed for the rash. If the rash is not better or still persists on Monday you are to call the office to let us know. DO NOT use the gel/ointment from radiation on the rash. You may take benadryl if needed for itching.  To help prevent nausea and vomiting after your treatment, we encourage you to take your nausea medication as instructed. If you develop nausea and vomiting that is not controlled by your nausea medication, call the clinic. If it is after clinic hours your family physician or the after hours number for the clinic or go to the Emergency Department. BELOW ARE SYMPTOMS THAT SHOULD BE REPORTED IMMEDIATELY:  *FEVER GREATER THAN 101.0 F  *CHILLS WITH OR WITHOUT FEVER  NAUSEA AND VOMITING THAT IS NOT CONTROLLED WITH YOUR NAUSEA MEDICATION  *UNUSUAL SHORTNESS OF BREATH  *UNUSUAL BRUISING OR BLEEDING  TENDERNESS IN MOUTH AND THROAT WITH OR WITHOUT PRESENCE OF ULCERS  *URINARY PROBLEMS  *BOWEL PROBLEMS  UNUSUAL RASH Items with * indicate a potential emergency and should be followed up as soon as possible.  Return as scheduled.  I have been informed and understand all the instructions given to me. I know to contact the clinic, my physician, or go to the Emergency Department if any problems should occur. I do not have any questions at this time, but understand that I may call the clinic during office hours or the Patient Navigator at [phone] should I have any questions or need assistance in obtaining follow up care.    __________________________________________  _____________  __________ Signature of Patient or Authorized Representative            Date                    Time    __________________________________________ Nurse's Signature

## 2014-04-14 NOTE — Progress Notes (Signed)
Tolerated infusion well. 

## 2014-04-14 NOTE — Progress Notes (Signed)
See chemo encounter for documentation

## 2014-04-17 ENCOUNTER — Inpatient Hospital Stay (HOSPITAL_COMMUNITY): Payer: Medicare Other

## 2014-04-17 ENCOUNTER — Encounter (HOSPITAL_COMMUNITY): Payer: Self-pay | Admitting: Hematology & Oncology

## 2014-04-17 ENCOUNTER — Ambulatory Visit (HOSPITAL_COMMUNITY): Payer: Medicare Other | Admitting: Hematology & Oncology

## 2014-04-17 NOTE — Progress Notes (Signed)
Dean Fraction, MD [address] [place] [address]  Left occipital condyle infiltrative lesion, not amenable to biopsy. Palliative XRT without a tissue diagnosis  Chest x-ray and right rib films were obtained which showed a soft tissue mass along the right posterior fifth rib with some rib destruction.  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 - SEE COMMENT. PERIPHERAL BLOOD: - NO SIGNIFICANT MORPHOLOGIC ABNORMALITIES. Diagnosis Note The plasma cells are increased in the marrow representing 33% of all cells in the aspirate associated with interstitial infiltrates, variably sized aggregates and large sheets in the clot and biopsy sections consistent with plasma cell neoplasm. Correlation with cytogenetic and FISH studies is recommended. For completion, immunohistochemical stains for CD138, kappa and lambda will be performed and reported in an addendum.  CURRENT THERAPY: XRT   INTERVAL HISTORY: Dean Milks79y.o. male returns for follow-up of myeloma;.he presented initially with a left occipital condyle infiltrative lesion that was not amenable to biopsy and underwent palliative radiation. 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 PET showed multiple osseous lesions. Bone marrow biopsy revealed 33% monoclonal plasma cells. He is on Velcade and dexamethasone. He has just received his Revlimid in the mail yesterday. He noticed some swelling in the right hand index and middle finger  that came up yesterday suddenly. He denies any trauma to the hand.  MEDICAL HISTORY: Past Medical History  Diagnosis Date  . Neuropathy   . BPH (benign prostatic hyperplasia)   . GERD (gastroesophageal reflux disease)   . H/O ETOH abuse   . Skull lesion     Left occipital condyle  . History of radiation therapy 01/05/13-02/10/13    45 gray to left occipital condyle region  . Multiple myeloma   . Radiation 02/21/14-03/08/14    right posterior chest wall area 30 gray    has Neuropathy; BPH (benign prostatic hyperplasia); GERD (gastroesophageal reflux disease); H/O ETOH abuse; Skull lesion; Neck pain; Fatigue; Loss of weight; Insomnia; Allergic rhinitis; Lumbar compression fracture; Dermatitis; and Multiple myeloma without remission on his problem list.      Multiple myeloma without remission   11/16/2012 Initial Diagnosis L occipital condyle destructive lesion, not biopsied secondary to location. XRT   12/01/2012 Imaging L3 superior endplate compression FX, no lytic lesions to suggest myeloma   02/06/2014 Imaging soft tissue mass along the right posterior fifth rib with some rib destruction   02/16/2014 Miscellaneous Normal CBC, Normal CMP, kappa,lamda ratio of 2.62 (abnormal), UIEP with slightly restricted band, IgG kappa, SPEP/IEP with monoclonal protein at 0.79 g/dl, normal igG, suppressed IGM   02/20/2014 Bone Marrow Biopsy Normal cytogenetics and myeloma FISH profile.   02/21/2014 - 03/08/2014 Radiation Therapy 30Gy to R rib lesion, lesion not biopsied   03/16/2014 PET scan Scattered hypermetabolic osseous lesions, including the calvarium, ribs, left scapula, sacrum, and left femoral shaft. An expansile left vertebral body lesion at L3 extends into the epidural space of the left lateral recess,  03/17/2014 - 04/07/2014 Chemotherapy Velcade and Dexamethasone due to initial denial of Revlimid by [employer].  Zometa monthly.   03/22/2014 Imaging MR_ L-Spine- Enhancing lesions compatible with  multiple myeloma at L2, L3, and S1-2.   04/07/2014 -  Chemotherapy      is allergic to codeine and morphine and related.  Dean [name] does not currently have medications on file.  SURGICAL HISTORY: Past Surgical History  Procedure Laterality Date  . Cyst excision  1959    tail bone    SOCIAL HISTORY: History   Social History  . Marital Status: Married    Spouse Name: N/A  . Number of Children: 3  . Years of Education: N/A   Occupational History  .     Social History Main Topics  . Smoking status: Never Smoker   . Smokeless tobacco: Never Used  . Alcohol Use: No     Comment: " Not much no more"  . Drug Use: No  . Sexual Activity: Not on file   Other Topics Concern  . Not on file   Social History Narrative    FAMILY HISTORY: Family History  Problem Relation Age of Onset  . Diabetes Mother   . Stroke Mother   . Hypertension Father     Review of Systems  Constitutional: Positive for malaise/fatigue.  HENT: Negative.   Eyes: Negative.   Respiratory: Negative.   Cardiovascular: Negative.   Gastrointestinal: Negative.   Genitourinary: Negative.   Musculoskeletal: Positive for back pain.       R hand with swelling and discomfort  Skin: Negative.   Neurological: Positive for weakness.  Endo/Heme/Allergies: Negative.   Psychiatric/Behavioral: Negative.     PHYSICAL EXAMINATION  ECOG PERFORMANCE STATUS: 1 - Symptomatic but completely ambulatory  Filed Vitals:   03/24/14 1000  BP: 139/67  Pulse: 81  Temp: 97.8 F (36.6 C)  Resp: 20    Physical Exam  Constitutional: He is oriented to person, place, and time and well-developed, well-nourished, and in no distress.  HENT:  Head: Normocephalic and atraumatic.  Nose: Nose normal.  Mouth/Throat: Oropharynx is clear and moist. No oropharyngeal exudate.  Eyes: Conjunctivae and EOM are normal. Pupils are equal, round, and reactive to light. Right eye exhibits no discharge. Left eye exhibits no discharge. No  scleral icterus.  Neck: Normal range of motion. Neck supple. No tracheal deviation present. No thyromegaly present.  Cardiovascular: Normal rate, regular rhythm and normal heart sounds.  Exam reveals no gallop and no friction rub.   No murmur heard. Pulmonary/Chest: Effort normal and breath sounds normal. He has no wheezes. He has no rales.  Abdominal: Soft. Bowel sounds are normal. He exhibits no distension and no mass. There is no tenderness. There is no rebound and no guarding.  Musculoskeletal: Normal range of motion. He exhibits no edema.  Lymphadenopathy:    He has no cervical adenopathy.  Neurological: He is alert and oriented to person, place, and time. He has normal reflexes. No cranial nerve deficit. Gait normal. Coordination normal.  Skin: Skin is warm and dry. No rash noted.  Psychiatric: Mood, memory, affect and judgment normal.  Nursing note and vitals reviewed.   LABORATORY DATA:  CBC    Component Value Date/Time   WBC 9.3 04/14/2014 1215   RBC 4.76 04/14/2014 1215   HGB 14.4 04/14/2014 1215   HCT 42.7 04/14/2014 1215   PLT 113* 04/14/2014 1215   MCV 89.7 04/14/2014 1215   MCH 30.3 04/14/2014 1215   MCHC  33.7 04/14/2014 1215   RDW 14.2 04/14/2014 1215   LYMPHSABS 0.5* 04/14/2014 1215   MONOABS 0.2 04/14/2014 1215   EOSABS 0.0 04/14/2014 1215   BASOSABS 0.0 04/14/2014 1215   CMP     Component Value Date/Time   NA 136 04/14/2014 1215   K 4.2 04/14/2014 1215   CL 103 04/14/2014 1215   CO2 26 04/14/2014 1215   GLUCOSE 104* 04/14/2014 1215   BUN 14 04/14/2014 1215   CREATININE 0.72 04/14/2014 1215   CREATININE 0.76 02/06/2014 1249   CALCIUM 9.2 04/14/2014 1215   PROT 6.4 04/14/2014 1215   ALBUMIN 3.3* 04/14/2014 1215   AST 15 04/14/2014 1215   ALT 13 04/14/2014 1215   ALKPHOS 31* 04/14/2014 1215   BILITOT 0.9 04/14/2014 1215   GFRNONAA 88* 04/14/2014 1215   GFRNONAA 89 02/06/2014 1249   GFRAA >90 04/14/2014 1215   GFRAA >89 02/06/2014 1249       RADIOGRAPHIC STUDIES: CLINICAL DATA: Initial treatment strategy for multiple myeloma.  EXAM: NUCLEAR MEDICINE PET WHOLE BODY  TECHNIQUE: Twelve mCi F-18 FDG was injected intravenously. Full-ring PET imaging was performed from the vertex to the feet after the radiotracer. CT data was obtained and used for attenuation correction and anatomic localization.  FASTING BLOOD GLUCOSE: Value: One hundred two mg/dl  COMPARISON: Multiple exams, including 02/20/2014 and 11/30/2012  RADIOPHARMACEUTICALS: Twelve mCi Technetium-99 MDP  FINDINGS: Head/Neck: 1.1 cm lytic lesion in the right frontotemporal bone region, probably with faintly increased metabolic activity, but not readily measure due to the proximity of the adjacent high a brain activity.  Chest: There is an unusual degree of reduced activity in the left ventricular myocardial apex and adjacent septum, enough to raise suspicion for prior myocardial infarction. Correlate with patient history.  Abdomen/Pelvis: Diffuse uptake in the descending and sigmoid colon in a region of significant diverticulosis is present, and extends down into the rectum. Given the diffuse nature of this activity, and is ascribed to physiologic etiology.  Enlarged prostate gland. Small umbilical hernia contains adipose tissue. Aortoiliac atherosclerotic vascular disease.  Skeleton: A variety of hypermetabolic bony lesions are observed compatible with myeloma.  Among these are a right second rib lesion with maximum standard uptake value 4.2; a left posterior glenoid lesion with maximum standard uptake value 4.1; an expansile right fifth rib primarily lytic lesion with maximum standard uptake value 3.0 ; a left L3 lytic lesion measuring 2.7 cm in diameter on image 168 of series 3 with maximum standard uptake value of 9.1 and with epidural extension in the left anterior lateral recess ; an expansile right sacral lesion with  maximum standard uptake value 8.1 ; a left lower lytic sacral lesion with maximum standard uptake value 8.3 ; and a left mid femoral shaft lesion with maximum standard uptake value 2.7.  Several other smaller scattered lesions are present, such as the small lytic lesion in the left lateral clavicle on image 70 of series 3.  IMPRESSION: 1. Scattered hypermetabolic osseous lesions, including the calvarium, ribs, left scapula, sacrum, and left femoral shaft. An expansile left vertebral body lesion at L3 extends into the epidural space of the left lateral recess, and probably merits lumbar spine MRI assessment. 2. Reduced activity in the left ventricular myocardial apex and adjacent septum, suspicious for prior myocardial infarction. 3. Physiologic activity in the distal colon. Descending and sigmoid colon diverticulosis. 4. Aortoiliac atherosclerotic vascular disease. 5. Enlarged prostate gland.   Electronically Signed  By: Van Clines M.D.  On: 03/16/2014 14:56  ASSESSMENT and THERAPY PLAN:   Multiple myeloma, IgG kappa  79 year old male with multiple myeloma, normal CBC and CMP. PET imaging confirms multiple lytic lesions throughout the skeleton. Bone survey has not been adequate to follow his skeletal disease. He has some fatigue on Velcade but is doing well given the findings in his hand today I have asked him to use ice several times daily. The exam is overall benign and most consistent with a minor soft tissue injury. We will obtain plain films of the hand to ensure no underlying problem with the bone. We spent time today reviewing the RVD regimen and we will plan on starting next week. I will see him back in several weeks with laboratory studies, sooner if needed. I advised him to be sure he is on an adult aspirin while taking Revlimid.  He is to continue his acyclovir.  All questions were answered. The patient knows to call the clinic with any problems, questions  or concerns. We can certainly see the patient much sooner if necessary.  This note was sisgned electronically Molli Hazard 04/17/2014

## 2014-04-19 ENCOUNTER — Ambulatory Visit
Admission: RE | Admit: 2014-04-19 | Discharge: 2014-04-19 | Disposition: A | Discharge status: Home / Self Care | Payer: Medicare Other | Source: Ambulatory Visit | Attending: Radiation Oncology | Admitting: Radiation Oncology

## 2014-04-19 DIAGNOSIS — C9000 Multiple myeloma not having achieved remission: Secondary | ICD-10-CM | POA: Diagnosis not present

## 2014-04-20 ENCOUNTER — Ambulatory Visit
Admission: RE | Admit: 2014-04-20 | Discharge: 2014-04-20 | Disposition: A | Discharge status: Home / Self Care | Payer: Medicare Other | Source: Ambulatory Visit | Attending: Radiation Oncology | Admitting: Radiation Oncology

## 2014-04-20 DIAGNOSIS — C9000 Multiple myeloma not having achieved remission: Secondary | ICD-10-CM | POA: Diagnosis not present

## 2014-04-20 NOTE — Progress Notes (Signed)
Pt here for patient teaching.  Radiation and You booklet given with areas of pertinence flagged and highlighted.  Reviewed diarrhea, fatigue and nausea and vomiting . Pt demonstrated understanding and verbalizes understanding of information given and will contact nursing with any questions or concerns.

## 2014-04-21 ENCOUNTER — Encounter (HOSPITAL_BASED_OUTPATIENT_CLINIC_OR_DEPARTMENT_OTHER): Payer: Medicare Other | Admitting: Oncology

## 2014-04-21 ENCOUNTER — Inpatient Hospital Stay (HOSPITAL_COMMUNITY): Payer: Medicare Other

## 2014-04-21 ENCOUNTER — Other Ambulatory Visit (HOSPITAL_COMMUNITY): Payer: Medicare Other

## 2014-04-21 ENCOUNTER — Ambulatory Visit
Admission: RE | Admit: 2014-04-21 | Discharge: 2014-04-21 | Disposition: A | Discharge status: Home / Self Care | Payer: Medicare Other | Source: Ambulatory Visit | Attending: Radiation Oncology | Admitting: Radiation Oncology

## 2014-04-21 ENCOUNTER — Ambulatory Visit (HOSPITAL_COMMUNITY): Payer: Medicare Other | Admitting: Oncology

## 2014-04-21 ENCOUNTER — Encounter (HOSPITAL_BASED_OUTPATIENT_CLINIC_OR_DEPARTMENT_OTHER): Payer: Medicare Other

## 2014-04-21 ENCOUNTER — Encounter (HOSPITAL_COMMUNITY): Payer: Self-pay | Admitting: Oncology

## 2014-04-21 VITALS — BP 139/74 | HR 85 | Temp 97.8°F | Resp 16 | Wt 234.8 lb

## 2014-04-21 DIAGNOSIS — N400 Enlarged prostate without lower urinary tract symptoms: Secondary | ICD-10-CM | POA: Diagnosis not present

## 2014-04-21 DIAGNOSIS — D472 Monoclonal gammopathy: Secondary | ICD-10-CM | POA: Diagnosis not present

## 2014-04-21 DIAGNOSIS — C9000 Multiple myeloma not having achieved remission: Secondary | ICD-10-CM

## 2014-04-21 DIAGNOSIS — Z5112 Encounter for antineoplastic immunotherapy: Secondary | ICD-10-CM

## 2014-04-21 DIAGNOSIS — K219 Gastro-esophageal reflux disease without esophagitis: Secondary | ICD-10-CM | POA: Diagnosis not present

## 2014-04-21 DIAGNOSIS — R630 Anorexia: Secondary | ICD-10-CM | POA: Diagnosis not present

## 2014-04-21 DIAGNOSIS — M899 Disorder of bone, unspecified: Secondary | ICD-10-CM | POA: Diagnosis not present

## 2014-04-21 DIAGNOSIS — R634 Abnormal weight loss: Secondary | ICD-10-CM | POA: Diagnosis not present

## 2014-04-21 LAB — COMPREHENSIVE METABOLIC PANEL
ALT: 17 U/L (ref 0–53)
AST: 17 U/L (ref 0–37)
Albumin: 3.4 g/dL — ABNORMAL LOW (ref 3.5–5.2)
Alkaline Phosphatase: 40 U/L (ref 39–117)
Anion gap: 8 (ref 5–15)
BUN: 23 mg/dL (ref 6–23)
CO2: 24 mmol/L (ref 19–32)
Calcium: 9 mg/dL (ref 8.4–10.5)
Chloride: 107 mmol/L (ref 96–112)
Creatinine, Ser: 0.65 mg/dL (ref 0.50–1.35)
GFR calc Af Amer: >90 mL/min (ref >90)
GFR calc non Af Amer: >90 mL/min (ref >90)
Glucose, Bld: 120 mg/dL — ABNORMAL HIGH (ref 70–99)
Potassium: 4.5 mmol/L (ref 3.5–5.1)
Sodium: 139 mmol/L (ref 135–145)
Total Bilirubin: 0.8 mg/dL (ref 0.3–1.2)
Total Protein: 6.3 g/dL (ref 6.0–8.3)

## 2014-04-21 LAB — CBC WITH DIFFERENTIAL/PLATELET
Basophils Absolute: 0 10*3/uL (ref 0.0–0.1)
Basophils Relative: 0 % (ref 0–1)
Eosinophils Absolute: 0 10*3/uL (ref 0.0–0.7)
Eosinophils Relative: 0 % (ref 0–5)
HCT: 43.6 % (ref 39.0–52.0)
Hemoglobin: 14.5 g/dL (ref 13.0–17.0)
Lymphocytes Relative: 4 % — ABNORMAL LOW (ref 12–46)
Lymphs Abs: 0.4 10*3/uL — ABNORMAL LOW (ref 0.7–4.0)
MCH: 30.1 pg (ref 26.0–34.0)
MCHC: 33.3 g/dL (ref 30.0–36.0)
MCV: 90.5 fL (ref 78.0–100.0)
Monocytes Absolute: 0.2 10*3/uL (ref 0.1–1.0)
Monocytes Relative: 2 % — ABNORMAL LOW (ref 3–12)
Neutro Abs: 9.4 10*3/uL — ABNORMAL HIGH (ref 1.7–7.7)
Neutrophils Relative %: 94 % — ABNORMAL HIGH (ref 43–77)
Platelets: 124 10*3/uL — ABNORMAL LOW (ref 150–400)
RBC: 4.82 MIL/uL (ref 4.22–5.81)
RDW: 14.4 % (ref 11.5–15.5)
WBC: 10.1 10*3/uL (ref 4.0–10.5)

## 2014-04-21 MED ORDER — ONDANSETRON HCL 4 MG PO TABS
8.0000 mg | ORAL_TABLET | Freq: Once | ORAL | Status: AC
  Administered 2014-04-21: 8 mg via ORAL
  Filled 2014-04-21: qty 2

## 2014-04-21 MED ORDER — SODIUM CHLORIDE 0.9 % IV SOLN: Freq: Once | INTRAVENOUS | Status: DC

## 2014-04-21 MED ORDER — BORTEZOMIB CHEMO SQ INJECTION 3.5 MG (2.5MG/ML)
1.3000 mg/m2 | Freq: Once | INTRAMUSCULAR | Status: AC
  Administered 2014-04-21: 3 mg via SUBCUTANEOUS
  Filled 2014-04-21: qty 3

## 2014-04-21 NOTE — Patient Instructions (Signed)
[place] at [facility] Discharge Instructions  RECOMMENDATIONS MADE BY THE CONSULTANT AND ANY TEST RESULTS WILL BE SENT TO YOUR REFERRING PHYSICIAN.  Exam and discussion by Robynn Pane, PA.   Tonic water with quinine for muscle cramps. Reduce Revlimid to 25 mg every other day for 14 days with 1 week off. Repeat Day 1 of each cycle. Return in 2 and 3 weeks for follow-up.  Thank you for choosing [place] at [facility] to provide your oncology and hematology care.  To afford each patient quality time with our provider, please arrive at least 15 minutes before your scheduled appointment time.    You need to re-schedule your appointment should you arrive 10 or more minutes late.  We strive to give you quality time with our providers, and arriving late affects you and other patients whose appointments are after yours.  Also, if you no show three or more times for appointments you may be dismissed from the clinic at the providers discretion.     Again, thank you for choosing [facility].  Our hope is that these requests will decrease the amount of time that you wait before being seen by our physicians.       _____________________________________________________________  Should you have questions after your visit to [facility], please contact our office at [phone] between the hours of 8:30 a.m. and 4:30 p.m.  Voicemails left after 4:30 p.m. will not be returned until the following business day.  For prescription refill requests, have your pharmacy contact our office.

## 2014-04-21 NOTE — Progress Notes (Signed)
Dean [name] presented for [employer]. Labs per MD order drawn via Peripheral Line 22 gauge needle inserted in left antecubital.  Good blood return present. Procedure without incident.  Needle removed intact. Patient tolerated procedure well.

## 2014-04-21 NOTE — Progress Notes (Signed)
      PICKARD,WARREN [name], MD [address] [place] [address]  Multiple myeloma without remission  CURRENT THERAPY: RVD beginning on 04/07/2014  INTERVAL HISTORY: Dean [name] 78 y.o. male returns for followup of multiple myeloma, monoclonal IgG kappa with scattered osseous lesions.    Multiple myeloma without remission   11/16/2012 Initial Diagnosis L occipital condyle destructive lesion, not biopsied secondary to location. XRT   12/01/2012 Imaging L3 superior endplate compression FX, no lytic lesions to suggest myeloma   02/06/2014 Imaging soft tissue mass along the right posterior fifth rib with some rib destruction   02/16/2014 Miscellaneous Normal CBC, Normal CMP, kappa,lamda ratio of 2.62 (abnormal), UIEP with slightly restricted band, IgG kappa, SPEP/IEP with monoclonal protein at 0.79 g/dl, normal igG, suppressed IGM   02/20/2014 Bone Marrow Biopsy Normal cytogenetics and myeloma FISH profile.   02/21/2014 - 03/08/2014 Radiation Therapy 30Gy to R rib lesion, lesion not biopsied   03/16/2014 PET scan Scattered hypermetabolic osseous lesions, including the calvarium, ribs, left scapula, sacrum, and left femoral shaft. An expansile left vertebral body lesion at L3 extends into the epidural space of the left lateral recess,    03/17/2014 - 04/07/2014 Chemotherapy Velcade and Dexamethasone due to initial denial of Revlimid by insurance company.  Zometa monthly.   03/22/2014 Imaging MR_ L-Spine- Enhancing lesions compatible with multiple myeloma at L2, L3, and S1-2.   04/07/2014 -  Chemotherapy RVD with Revlimid at 25 mg days 1-14.  Revlimid dose reduced to 25 mg every other day x 14 days with 7 day respite beginning on day 1 of cycle 2.   04/17/2014 Adverse Reaction Revlimid-induced rash.  Treated with steroids.   I personally reviewed and went over laboratory results with the patient.  The results are noted within this dictation.  Unfortunately, Dean [name] developed a  Revlimid-induced rash.  This was treated symptomatically with resolution noted on exam today.  I provided him education regarding drug rash.  As a result of this toxicity, we will reduce the of Revlimid to 25 mg every other day during days 1-14 followed by 7 day respite.  He notes some muscle cramping, but his K+ is WNL.  I recommended Tonic water with quinine to see if that helps symptomatically.   Hematologically, he denies any complaints and ROS questioning is negative.  Past Medical History  Diagnosis Date  . Neuropathy   . BPH (benign prostatic hyperplasia)   . GERD (gastroesophageal reflux disease)   . H/O ETOH abuse   . Skull lesion     Left occipital condyle  . History of radiation therapy 01/05/13-02/10/13    45 gray to left occipital condyle region  . Multiple myeloma   . Radiation 02/21/14-03/08/14    right posterior chest wall area 30 gray    has Neuropathy; BPH (benign prostatic hyperplasia); GERD (gastroesophageal reflux disease); H/O ETOH abuse; Skull lesion; Neck pain; Fatigue; Loss of weight; Insomnia; Allergic rhinitis; Lumbar compression fracture; Dermatitis; and Multiple myeloma without remission on his problem list.     is allergic to codeine and morphine and related.  Dean [name] does not currently have medications on file.  Past Surgical History  Procedure Laterality Date  . Cyst excision  1959    tail bone    Denies any headaches, dizziness, double vision, fevers, chills, night sweats, nausea, vomiting, diarrhea, constipation, chest pain, heart palpitations, shortness of breath, blood in stool, black tarry stool, urinary pain, urinary burning, urinary frequency, hematuria.     PHYSICAL EXAMINATION  ECOG PERFORMANCE STATUS: 0 - Asymptomatic  Filed Vitals:   04/21/14 1256  BP: 139/74  Pulse: 85  Temp: 97.8 F (36.6 C)  Resp: 16    GENERAL:alert, no distress, well nourished, well developed, comfortable, cooperative, obese and smiling, accompanied by  wife. SKIN: skin color, texture, turgor are normal, no rashes or significant lesions HEAD: Normocephalic, No masses, lesions, tenderness or abnormalities EYES: normal, PERRLA, EOMI, Conjunctiva are pink and non-injected EARS: External ears normal OROPHARYNX:lips, buccal mucosa, and tongue normal and mucous membranes are moist  NECK: supple, no stridor, trachea midline LYMPH:  no palpable lymphadenopathy BREAST:not examined LUNGS: clear to auscultation  HEART: regular rate & rhythm, no murmurs, no gallops, S1 normal and S2 normal ABDOMEN:abdomen soft, non-tender, obese and normal bowel sounds BACK: Back symmetric, no curvature. EXTREMITIES:less then 2 second capillary refill, no joint deformities, effusion, or inflammation, no skin discoloration, no cyanosis  NEURO: alert & oriented x 3 with fluent speech, no focal motor/sensory deficits, gait normal  LABORATORY DATA: CBC    Component Value Date/Time   WBC 10.1 04/21/2014 1305   RBC 4.82 04/21/2014 1305   HGB 14.5 04/21/2014 1305   HCT 43.6 04/21/2014 1305   PLT 124* 04/21/2014 1305   MCV 90.5 04/21/2014 1305   MCH 30.1 04/21/2014 1305   MCHC 33.3 04/21/2014 1305   RDW 14.4 04/21/2014 1305   LYMPHSABS 0.4* 04/21/2014 1305   MONOABS 0.2 04/21/2014 1305   EOSABS 0.0 04/21/2014 1305   BASOSABS 0.0 04/21/2014 1305      Chemistry      Component Value Date/Time   NA 139 04/21/2014 1305   K 4.5 04/21/2014 1305   CL 107 04/21/2014 1305   CO2 24 04/21/2014 1305   BUN 23 04/21/2014 1305   CREATININE 0.65 04/21/2014 1305   CREATININE 0.76 02/06/2014 1249      Component Value Date/Time   CALCIUM 9.0 04/21/2014 1305   ALKPHOS 40 04/21/2014 1305   AST 17 04/21/2014 1305   ALT 17 04/21/2014 1305   BILITOT 0.8 04/21/2014 1305      RADIOGRAPHIC STUDIES:  Dg Hand Complete Right  03/24/2014   CLINICAL DATA:  RIGHT hand pain and swelling for several days. No reported injury.  EXAM: RIGHT HAND - COMPLETE 3+ VIEW  COMPARISON:   None.  FINDINGS: There is no evidence of fracture or dislocation. There is no evidence of arthropathy or other focal bone abnormality. Mild diffuse soft tissue swelling without visible foreign body or laceration.  IMPRESSION: No osseous abnormality.  Mild diffuse soft tissue swelling.   Electronically Signed   By: Rolla Flatten M.D.   On: 03/24/2014 11:35     ASSESSMENT AND PLAN:  Multiple myeloma without remission S/P Day 1 and 4 of cycle 1 of VD.  He had edema of fingers #2 and #3 which the patient thought was from [place].  Due to his concerns, we held day 8 and 11 of cycle 1.  In the interim, we got Revlimid approved after insurance company denied this standard of care of treatment.  Therefore, day 8 and 11 of cycle 1 of VD were cancelled/deleted in lieu of moving on to RVD therapy which began on 04/07/2014.  He is currently on ASA 325 mg daily and he is to continue with this prophylaxis intervention.  He developed a rash in the middle of cycle 1 correlating with a possible Revlimid-induced rash.  This medicine was therefore placed on hold and the rash was treated  with resolution by today.  We will therefore reduce the dose of Revlimid to 25 mg every other day x 14 days with 7 day respite beginning with cycle 2.  Return in 2 weeks for follow-up of Revlimid tolerability.  He is due for Zometa in 3 weeks.   He notes muscle cramping in the setting of normal K+.  Therefore, I recommended tonic water with quinine to see if that helps.   THERAPY PLAN:  Continue treatment as planned.  Next week for the start of cycle 2, we will reduce the dose of Revlimid to 25 mg every other day x 14 days with 7 day respite.  All questions were answered. The patient knows to call the clinic with any problems, questions or concerns. We can certainly see the patient much sooner if necessary.  Patient and plan discussed with Dr. Shannon Penland and she is in agreement with the aforementioned.   This note is electronically  signed by: , 04/21/2014 2:14 PM   

## 2014-04-21 NOTE — Assessment & Plan Note (Addendum)
S/P Day 1 and 4 of cycle 1 of VD.  He had edema of fingers #2 and #3 which the patient thought was from [place].  Due to his concerns, we held day 8 and 11 of cycle 1.  In the interim, we got Revlimid approved after insurance company denied this standard of care of treatment.  Therefore, day 8 and 11 of cycle 1 of VD were cancelled/deleted in lieu of moving on to RVD therapy which began on 04/07/2014.  He is currently on ASA 325 mg daily and he is to continue with this prophylaxis intervention.  He developed a rash in the middle of cycle 1 correlating with a possible Revlimid-induced rash.  This medicine was therefore placed on hold and the rash was treated with resolution by today.  We will therefore reduce the dose of Revlimid to 25 mg every other day x 14 days with 7 day respite beginning with cycle 2.  Return in 2 weeks for follow-up of Revlimid tolerability.  He is due for Zometa in 3 weeks.   He notes muscle cramping in the setting of normal K+.  Therefore, I recommended tonic water with quinine to see if that helps.

## 2014-04-21 NOTE — Patient Instructions (Signed)
[facility] Discharge Instructions for Patients Receiving Chemotherapy  Today you received the following chemotherapy agents Velcade injection as ordered.  To help prevent nausea and vomiting after your treatment, we encourage you to take your nausea medication as instructed. If you develop nausea and vomiting that is not controlled by your nausea medication, call the clinic. If it is after clinic hours your family physician or the after hours number for the clinic or go to the Emergency Department. BELOW ARE SYMPTOMS THAT SHOULD BE REPORTED IMMEDIATELY:  *FEVER GREATER THAN 101.0 F  *CHILLS WITH OR WITHOUT FEVER  NAUSEA AND VOMITING THAT IS NOT CONTROLLED WITH YOUR NAUSEA MEDICATION  *UNUSUAL SHORTNESS OF BREATH  *UNUSUAL BRUISING OR BLEEDING  TENDERNESS IN MOUTH AND THROAT WITH OR WITHOUT PRESENCE OF ULCERS  *URINARY PROBLEMS  *BOWEL PROBLEMS  UNUSUAL RASH Items with * indicate a potential emergency and should be followed up as soon as possible.  Return as scheduled.  I have been informed and understand all the instructions given to me. I know to contact the clinic, my physician, or go to the Emergency Department if any problems should occur. I do not have any questions at this time, but understand that I may call the clinic during office hours or the Patient Navigator at [phone] should I have any questions or need assistance in obtaining follow up care.    __________________________________________  _____________  __________ Signature of Patient or Authorized Representative            Date                   Time    __________________________________________ Nurse's Signature

## 2014-04-24 ENCOUNTER — Ambulatory Visit
Admission: RE | Admit: 2014-04-24 | Discharge: 2014-04-24 | Disposition: A | Discharge status: Home / Self Care | Payer: Medicare Other | Source: Ambulatory Visit | Attending: Radiation Oncology | Admitting: Radiation Oncology

## 2014-04-24 DIAGNOSIS — C9000 Multiple myeloma not having achieved remission: Secondary | ICD-10-CM | POA: Diagnosis not present

## 2014-04-25 ENCOUNTER — Ambulatory Visit
Admission: RE | Admit: 2014-04-25 | Discharge: 2014-04-25 | Disposition: A | Discharge status: Home / Self Care | Payer: Medicare Other | Source: Ambulatory Visit | Attending: Radiation Oncology | Admitting: Radiation Oncology

## 2014-04-25 ENCOUNTER — Encounter: Payer: Self-pay | Admitting: Radiation Oncology

## 2014-04-25 VITALS — BP 147/71 | HR 79 | Temp 98.3°F | Resp 16 | Ht 68.0 in | Wt 235.3 lb

## 2014-04-25 DIAGNOSIS — C9000 Multiple myeloma not having achieved remission: Secondary | ICD-10-CM | POA: Diagnosis not present

## 2014-04-25 NOTE — Progress Notes (Signed)
  Radiation Oncology         [phone] ________________________________  Name: Dean [name] MRN: [id number]  Date: 04/25/2014  DOB: [birth year]  Weekly Radiation Therapy Management  DIAGNOSIS: Multiple myeloma involving the lumbar spine  Current Dose: 12.5 Gy     Planned Dose:  25 Gy  Narrative . . . . . . . . The patient presents for routine under treatment assessment.                                   The patient is without complaint.  He denies any nausea or appetite issues. He denies any back pain at this time                                 Set-up films were reviewed.                                 The chart was checked. Physical Findings. . .  height is $RemoveB'5\' 8"'FjDZULmr$  (1.727 m) and weight is 235 lb 4.8 oz (106.731 kg). His oral temperature is 98.3 F (36.8 C). His blood pressure is 147/71 and his pulse is 79. His respiration is 16. . Weight essentially stable.  No significant changes. The lungs are clear. The heart has a regular rhythm and rate. The abdomen is soft and nontender Impression . . . . . . . The patient is tolerating radiation. Plan . . . . . . . . . . . . Continue treatment as planned.  ________________________________   Blair Promise, PhD, MD

## 2014-04-25 NOTE — Progress Notes (Signed)
Sylvain Hasten has completed 5 fractions to his lumbar spine.  He denies pain except for occasional soreness in the back of his neck/occipital area.  He is taking gabapentin and percocet PRN.  He denies nausea and diarrhea.  He reports he is not able to taste foods but can smell them.  He reports his appetite is fair.  His weight is stable.  He continues to take decadron, velcade and revlimid.  He reports having a rash from the revlimid and has been switched to every other day.  He reports fatigue although he says he feels the best today that he has in 6 months.  BP 147/71 mmHg  Pulse 79  Temp(Src) 98.3 F (36.8 C) (Oral)  Resp 16  Ht 5\' 8"  (1.727 m)  Wt 235 lb 4.8 oz (106.731 kg)  BMI 35.79 kg/m2

## 2014-04-26 ENCOUNTER — Ambulatory Visit
Admission: RE | Admit: 2014-04-26 | Discharge: 2014-04-26 | Disposition: A | Discharge status: Home / Self Care | Payer: Medicare Other | Source: Ambulatory Visit | Attending: Radiation Oncology | Admitting: Radiation Oncology

## 2014-04-26 DIAGNOSIS — C9000 Multiple myeloma not having achieved remission: Secondary | ICD-10-CM | POA: Diagnosis not present

## 2014-04-27 ENCOUNTER — Encounter (HOSPITAL_BASED_OUTPATIENT_CLINIC_OR_DEPARTMENT_OTHER): Payer: Medicare Other

## 2014-04-27 ENCOUNTER — Inpatient Hospital Stay (HOSPITAL_COMMUNITY): Payer: Medicare Other

## 2014-04-27 ENCOUNTER — Ambulatory Visit
Admission: RE | Admit: 2014-04-27 | Discharge: 2014-04-27 | Disposition: A | Discharge status: Home / Self Care | Payer: Medicare Other | Source: Ambulatory Visit | Attending: Radiation Oncology | Admitting: Radiation Oncology

## 2014-04-27 ENCOUNTER — Other Ambulatory Visit (HOSPITAL_COMMUNITY): Payer: Medicare Other

## 2014-04-27 ENCOUNTER — Encounter (HOSPITAL_COMMUNITY): Payer: Self-pay

## 2014-04-27 ENCOUNTER — Ambulatory Visit (HOSPITAL_COMMUNITY): Payer: Medicare Other | Admitting: Hematology & Oncology

## 2014-04-27 DIAGNOSIS — M899 Disorder of bone, unspecified: Secondary | ICD-10-CM | POA: Diagnosis not present

## 2014-04-27 DIAGNOSIS — C9000 Multiple myeloma not having achieved remission: Secondary | ICD-10-CM | POA: Diagnosis present

## 2014-04-27 DIAGNOSIS — Z5112 Encounter for antineoplastic immunotherapy: Secondary | ICD-10-CM

## 2014-04-27 DIAGNOSIS — N400 Enlarged prostate without lower urinary tract symptoms: Secondary | ICD-10-CM | POA: Diagnosis not present

## 2014-04-27 DIAGNOSIS — R634 Abnormal weight loss: Secondary | ICD-10-CM | POA: Diagnosis not present

## 2014-04-27 DIAGNOSIS — D472 Monoclonal gammopathy: Secondary | ICD-10-CM | POA: Diagnosis not present

## 2014-04-27 DIAGNOSIS — K219 Gastro-esophageal reflux disease without esophagitis: Secondary | ICD-10-CM | POA: Diagnosis not present

## 2014-04-27 DIAGNOSIS — R630 Anorexia: Secondary | ICD-10-CM | POA: Diagnosis not present

## 2014-04-27 LAB — CBC WITH DIFFERENTIAL/PLATELET
Basophils Absolute: 0 10*3/uL (ref 0.0–0.1)
Basophils Relative: 0 % (ref 0–1)
Eosinophils Absolute: 0.1 10*3/uL (ref 0.0–0.7)
Eosinophils Relative: 1 % (ref 0–5)
HCT: 40.2 % (ref 39.0–52.0)
Hemoglobin: 13.4 g/dL (ref 13.0–17.0)
Lymphocytes Relative: 21 % (ref 12–46)
Lymphs Abs: 1.1 10*3/uL (ref 0.7–4.0)
MCH: 30.2 pg (ref 26.0–34.0)
MCHC: 33.3 g/dL (ref 30.0–36.0)
MCV: 90.5 fL (ref 78.0–100.0)
Monocytes Absolute: 0.6 10*3/uL (ref 0.1–1.0)
Monocytes Relative: 12 % (ref 3–12)
Neutro Abs: 3.3 10*3/uL (ref 1.7–7.7)
Neutrophils Relative %: 66 % (ref 43–77)
Platelets: 112 10*3/uL — ABNORMAL LOW (ref 150–400)
RBC: 4.44 MIL/uL (ref 4.22–5.81)
RDW: 14.6 % (ref 11.5–15.5)
WBC: 4.9 10*3/uL (ref 4.0–10.5)

## 2014-04-27 LAB — COMPREHENSIVE METABOLIC PANEL
ALT: 15 U/L (ref 0–53)
AST: 15 U/L (ref 0–37)
Albumin: 3.1 g/dL — ABNORMAL LOW (ref 3.5–5.2)
Alkaline Phosphatase: 33 U/L — ABNORMAL LOW (ref 39–117)
Anion gap: 7 (ref 5–15)
BUN: 19 mg/dL (ref 6–23)
CO2: 24 mmol/L (ref 19–32)
Calcium: 8.5 mg/dL (ref 8.4–10.5)
Chloride: 107 mmol/L (ref 96–112)
Creatinine, Ser: 0.65 mg/dL (ref 0.50–1.35)
GFR calc Af Amer: >90 mL/min (ref >90)
GFR calc non Af Amer: >90 mL/min (ref >90)
Glucose, Bld: 90 mg/dL (ref 70–99)
Potassium: 4 mmol/L (ref 3.5–5.1)
Sodium: 138 mmol/L (ref 135–145)
Total Bilirubin: 0.6 mg/dL (ref 0.3–1.2)
Total Protein: 5.7 g/dL — ABNORMAL LOW (ref 6.0–8.3)

## 2014-04-27 LAB — SEDIMENTATION RATE: Sed Rate: 14 mm/hr (ref 0–16)

## 2014-04-27 LAB — LACTATE DEHYDROGENASE: LDH: 134 U/L (ref 94–250)

## 2014-04-27 MED ORDER — BORTEZOMIB CHEMO SQ INJECTION 3.5 MG (2.5MG/ML)
1.3000 mg/m2 | Freq: Once | INTRAMUSCULAR | Status: AC
  Administered 2014-04-27: 3 mg via SUBCUTANEOUS
  Filled 2014-04-27: qty 3

## 2014-04-27 MED ORDER — SODIUM CHLORIDE 0.9 % IV SOLN: Freq: Once | INTRAVENOUS | Status: DC

## 2014-04-27 MED ORDER — ONDANSETRON HCL 4 MG PO TABS
ORAL_TABLET | ORAL | Status: AC
  Filled 2014-04-27: qty 2

## 2014-04-27 MED ORDER — ONDANSETRON HCL 4 MG PO TABS
8.0000 mg | ORAL_TABLET | Freq: Once | ORAL | Status: AC
  Administered 2014-04-27: 8 mg via ORAL

## 2014-04-27 NOTE — Progress Notes (Signed)
1340:  Velcade administration without incident; see MAR for injection details.  Patient tolerated procedure well and without incident.  No questions or complaints noted at this time.

## 2014-04-27 NOTE — Progress Notes (Signed)
Labs drawn

## 2014-04-27 NOTE — Patient Instructions (Signed)
[facility] Discharge Instructions for Patients Receiving Chemotherapy  Today you received the following chemotherapy agents:  Velcade  If you develop nausea and vomiting, or diarrhea that is not controlled by your medication, call the clinic.  The clinic phone number is [phone]. Office hours are Monday-Friday 8:30am-5:00pm.  BELOW ARE SYMPTOMS THAT SHOULD BE REPORTED IMMEDIATELY:  *FEVER GREATER THAN 101.0 F  *CHILLS WITH OR WITHOUT FEVER  NAUSEA AND VOMITING THAT IS NOT CONTROLLED WITH YOUR NAUSEA MEDICATION  *UNUSUAL SHORTNESS OF BREATH  *UNUSUAL BRUISING OR BLEEDING  TENDERNESS IN MOUTH AND THROAT WITH OR WITHOUT PRESENCE OF ULCERS  *URINARY PROBLEMS  *BOWEL PROBLEMS  UNUSUAL RASH Items with * indicate a potential emergency and should be followed up as soon as possible. If you have an emergency after office hours please contact your primary care physician or go to the nearest emergency department.  Please call the clinic during office hours if you have any questions or concerns.   You may also contact the Patient Navigator at [phone] should you have any questions or need assistance in obtaining follow up care. _____________________________________________________________________ Have you asked about our STAR program?    STAR stands for Survivorship Training and Rehabilitation, and this is a nationally recognized cancer care program that focuses on survivorship and rehabilitation.  Cancer and cancer treatments may cause problems, such as, pain, making you feel tired and keeping you from doing the things that you need or want to do. Cancer rehabilitation can help. Our goal is to reduce these troubling effects and help you have the best quality of life possible.  You may receive a survey from a nurse that asks questions about your current state of health.  Based on the survey results, all eligible patients will be referred to the STAR program for an  evaluation so we can better serve you! A frequently asked questions sheet is available upon request.           

## 2014-04-28 ENCOUNTER — Ambulatory Visit
Admission: RE | Admit: 2014-04-28 | Discharge: 2014-04-28 | Disposition: A | Discharge status: Home / Self Care | Payer: Medicare Other | Source: Ambulatory Visit | Attending: Radiation Oncology | Admitting: Radiation Oncology

## 2014-04-28 DIAGNOSIS — C9000 Multiple myeloma not having achieved remission: Secondary | ICD-10-CM | POA: Diagnosis not present

## 2014-04-28 LAB — MULTIPLE MYELOMA PANEL, SERUM
Albumin SerPl Elph-Mcnc: 2.7 g/dL — ABNORMAL LOW (ref 3.2–5.6)
Albumin/Glob SerPl: 1.2 (ref 0.7–2.0)
Alpha 1: 0.3 g/dL (ref 0.1–0.4)
Alpha2 Glob SerPl Elph-Mcnc: 0.8 g/dL (ref 0.4–1.2)
B-Globulin SerPl Elph-Mcnc: 0.8 g/dL (ref 0.6–1.3)
Gamma Glob SerPl Elph-Mcnc: 0.4 g/dL — ABNORMAL LOW (ref 0.5–1.6)
Globulin, Total: 2.3 g/dL (ref 2.0–4.5)
IgA: 85 mg/dL (ref 61–437)
IgG (Immunoglobin G), Serum: 470 mg/dL — ABNORMAL LOW (ref 700–1600)
IgM, Serum: 20 mg/dL (ref 15–143)
M Protein SerPl Elph-Mcnc: 0.1 g/dL — ABNORMAL HIGH
Total Protein ELP: 5 g/dL — ABNORMAL LOW (ref 6.0–8.5)

## 2014-05-01 ENCOUNTER — Ambulatory Visit
Admission: RE | Admit: 2014-05-01 | Discharge: 2014-05-01 | Disposition: A | Discharge status: Home / Self Care | Payer: Medicare Other | Source: Ambulatory Visit | Attending: Radiation Oncology | Admitting: Radiation Oncology

## 2014-05-01 DIAGNOSIS — C9000 Multiple myeloma not having achieved remission: Secondary | ICD-10-CM | POA: Diagnosis not present

## 2014-05-02 ENCOUNTER — Encounter: Payer: Self-pay | Admitting: Radiation Oncology

## 2014-05-02 ENCOUNTER — Ambulatory Visit
Admission: RE | Admit: 2014-05-02 | Discharge: 2014-05-02 | Disposition: A | Discharge status: Home / Self Care | Payer: Medicare Other | Source: Ambulatory Visit | Attending: Radiation Oncology | Admitting: Radiation Oncology

## 2014-05-02 ENCOUNTER — Ambulatory Visit: Payer: Medicare Other

## 2014-05-02 VITALS — BP 118/48 | HR 79 | Temp 97.6°F | Resp 20 | Wt 227.9 lb

## 2014-05-02 DIAGNOSIS — C9000 Multiple myeloma not having achieved remission: Secondary | ICD-10-CM | POA: Diagnosis not present

## 2014-05-02 NOTE — Progress Notes (Signed)
Weekly rad txs 10/10 completed, was very weak this weekend, feeling a little better poor appetite, ambien not helping with his sleep, took xanax and gabapentin last night ,slept 3 hours, c/o abdominal pain off and on 1 week,  No nausea, c/o insomnia , 1 month f/u appt card given, no back pain stated, regular bowel movements 9:46 AM

## 2014-05-02 NOTE — Progress Notes (Signed)
  Radiation Oncology         [phone] ________________________________  Name: Dean [name] MRN: [id number]  Date: 05/02/2014  DOB: Mar 23, 1935  Weekly Radiation Therapy Management  Diagnosis: Multiple myeloma  Current Dose: 25 Gy     Planned Dose:  25 Gy  Narrative . . . . . . . . The patient presents for routine under treatment assessment.                                   The patient is having some fatigue. He also had some nausea over the weekend but is feeling better today. He has low back pain.                                 Set-up films were reviewed.                                 The chart was checked. Physical Findings. . .  weight is 227 lb 14.4 oz (103.375 kg). His oral temperature is 97.6 F (36.4 C). His blood pressure is 118/48 and his pulse is 79. His respiration is 20 and oxygen saturation is 98%. . Weight essentially stable.  No significant changes. Impression . . . . . . . The patient is tolerating radiation. Plan . . . . . . . . . . . . Routine follow-up in one month  ________________________________   Blair Promise, PhD, MD

## 2014-05-03 ENCOUNTER — Ambulatory Visit: Payer: Medicare Other

## 2014-05-04 NOTE — Assessment & Plan Note (Addendum)
S/P cycle 1 of RVD.  He is currently on prophylactic ASA 325 mg daily and he is to continue with this.  He developed a rash in the middle of cycle 1 correlating with a possible Revlimid-induced rash.  This medicine was therefore placed on hold and dose reduced for cycle 2 of therapy (Revlimid to 25 mg every other day x 14 days with 7 day respite).  Pre-chemotherapy labs as ordered.    He feels great!  Somehow, he got off schedule with his Revlimid as he reports today is his last day of Revlimid.  I wrote a new Rx for Revlimid with correct start date.  He will get an extra week off from Revlimid in order to get him back on schedule.    Return in 2 weeks for follow-up.  He is due for Zometa next week.

## 2014-05-04 NOTE — Progress Notes (Signed)
[name], MD [address] [place] [address]  Multiple myeloma without remission - Plan: CBC with Differential, Comprehensive metabolic panel, lenalidomide (REVLIMID) 25 MG capsule  Multiple myeloma - Plan: CBC with Differential, Comprehensive metabolic panel  CURRENT THERAPY: RVD beginning on 04/07/2014  INTERVAL HISTORY: Dean [name] 79 y.o. male returns for followup of multiple myeloma, monoclonal IgG kappa with scattered osseous lesions.    Multiple myeloma without remission   11/16/2012 Initial Diagnosis L occipital condyle destructive lesion, not biopsied secondary to location. XRT   12/01/2012 Imaging L3 superior endplate compression FX, no lytic lesions to suggest myeloma   02/06/2014 Imaging soft tissue mass along the right posterior fifth rib with some rib destruction   02/16/2014 Miscellaneous Normal CBC, Normal CMP, kappa,lamda ratio of 2.62 (abnormal), UIEP with slightly restricted band, IgG kappa, SPEP/IEP with monoclonal protein at 0.79 g/dl, normal igG, suppressed IGM   02/20/2014 Bone Marrow Biopsy Normal cytogenetics and myeloma FISH profile.   02/21/2014 - 03/08/2014 Radiation Therapy 30Gy to R rib lesion, lesion not biopsied   03/16/2014 PET scan Scattered hypermetabolic osseous lesions, including the calvarium, ribs, left scapula, sacrum, and left femoral shaft. An expansile left vertebral body lesion at L3 extends into the epidural space of the left lateral recess,    03/17/2014 - 04/07/2014 Chemotherapy Velcade and Dexamethasone due to initial denial of Revlimid by insurance company.  Zometa monthly.   03/22/2014 Imaging MR_ L-Spine- Enhancing lesions compatible with multiple myeloma at L2, L3, and S1-2.   04/07/2014 -  Chemotherapy RVD with Revlimid at 25 mg days 1-14.  Revlimid dose reduced to 25 mg every other day x 14 days with 7 day respite beginning on day 1 of cycle 2.   04/17/2014 Adverse Reaction Revlimid-induced rash.  Treated with  steroids.    I personally reviewed and went over laboratory results with the patient.  The results are noted within this dictation.  "I feel great!"  "I feel the best I have felt in a long time."  He is tolerating therapy and his dose reduction is Revlimid has been helpful with regards to his tolerability.  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  He reports that today is his last day of Revlimid.  He notes that he started 13 days ago and today is his last day of Revlimid.  I cannot say how we got off schedule as he is to start Revlimid on day 1 of each cycle.  As a result, I will give him 2 weeks off from Revlimid in order to get him back on schedule.  A new Rx is provided today with correct start date.  Hematologically, he denies any complaints and ROS questioning is negative.  Past Medical History  Diagnosis Date  . Neuropathy   . BPH (benign prostatic hyperplasia)   . GERD (gastroesophageal reflux disease)   . H/O ETOH abuse   . Skull lesion     Left occipital condyle  . History of radiation therapy 01/05/13-02/10/13    45 gray to left occipital condyle region  . Multiple myeloma   . Radiation 02/21/14-03/08/14    right posterior chest wall area 30 gray    has Neuropathy; BPH (benign prostatic hyperplasia); GERD (gastroesophageal reflux disease); H/O ETOH abuse; Skull lesion; Neck pain; Fatigue; Loss of weight; Insomnia; Allergic rhinitis; Lumbar compression fracture; Dermatitis; and Multiple myeloma without remission on his problem list.  is allergic to codeine and morphine and related.  Dean [name] had no medications administered during this visit.  Past Surgical History  Procedure Laterality Date  . Cyst excision  1959    tail bone    Denies any headaches, dizziness, double vision, fevers, chills, night sweats, nausea, vomiting, diarrhea, constipation, chest pain, heart palpitations, shortness of breath, blood in stool, black tarry stool, urinary pain,  urinary burning, urinary frequency, hematuria.   PHYSICAL EXAMINATION  ECOG PERFORMANCE STATUS: 0 - Asymptomatic  Filed Vitals:   05/05/14 0950  BP: 122/69  Pulse: 72  Temp: 97.9 F (36.6 C)  Resp: 16    GENERAL:alert, no distress, well nourished, well developed, comfortable, cooperative, obese and smiling SKIN: skin color, texture, turgor are normal, no rashes or significant lesions HEAD: Normocephalic, No masses, lesions, tenderness or abnormalities EYES: normal, PERRLA, EOMI, Conjunctiva are pink and non-injected EARS: External ears normal OROPHARYNX:lips, buccal mucosa, and tongue normal and mucous membranes are moist  NECK: supple, no adenopathy, thyroid normal size, non-tender, without nodularity, no stridor, non-tender, trachea midline LYMPH:  no palpable lymphadenopathy BREAST:not examined LUNGS: clear to auscultation  HEART: regular rate & rhythm, no murmurs and no gallops ABDOMEN:abdomen soft, non-tender, obese and normal bowel sounds BACK: Back symmetric, no curvature., No CVA tenderness EXTREMITIES:less then 2 second capillary refill, no joint deformities, effusion, or inflammation, no edema, no skin discoloration, no clubbing, no cyanosis  NEURO: alert & oriented x 3 with fluent speech, no focal motor/sensory deficits, gait normal   LABORATORY DATA: CBC    Component Value Date/Time   WBC 3.9* 05/05/2014 1005   RBC 4.19* 05/05/2014 1005   HGB 12.7* 05/05/2014 1005   HCT 38.0* 05/05/2014 1005   PLT 133* 05/05/2014 1005   MCV 90.7 05/05/2014 1005   MCH 30.3 05/05/2014 1005   MCHC 33.4 05/05/2014 1005   RDW 14.5 05/05/2014 1005   LYMPHSABS 0.5* 05/05/2014 1005   MONOABS 0.4 05/05/2014 1005   EOSABS 0.0 05/05/2014 1005   BASOSABS 0.0 05/05/2014 1005      Chemistry      Component Value Date/Time   NA 138 04/27/2014 1221   K 4.0 04/27/2014 1221   CL 107 04/27/2014 1221   CO2 24 04/27/2014 1221   BUN 19 04/27/2014 1221   CREATININE 0.65 04/27/2014 1221     CREATININE 0.76 02/06/2014 1249      Component Value Date/Time   CALCIUM 8.5 04/27/2014 1221   ALKPHOS 33* 04/27/2014 1221   AST 15 04/27/2014 1221   ALT 15 04/27/2014 1221   BILITOT 0.6 04/27/2014 1221     Lab Results  Component Value Date   PROT 5.7* 04/27/2014   ALBUMINELP 52.2* 04/07/2014   A1GS 6.7* 04/07/2014   A2GS 15.4* 04/07/2014   BETS 6.0 04/07/2014   BETA2SER 5.3 04/07/2014   GAMS 14.4 04/07/2014   MSPIKE 0.45 04/07/2014   SPEI (NOTE) 04/07/2014   SPECOM (NOTE) 04/07/2014   IGGSERUM 470* 04/27/2014   IGA 85 04/27/2014   IGMSERUM 20 04/27/2014   IMMELINT (NOTE) 04/07/2014   KPAFRELGTCHN 38.37* 04/07/2014   LAMBDASER 25.61 04/07/2014   KAPLAMBRATIO 1.50 04/07/2014     ASSESSMENT AND PLAN:  Multiple myeloma without remission S/P cycle 1 of RVD.  He is currently on prophylactic ASA 325 mg daily and he is to continue with this.  He developed a rash in the middle of cycle 1 correlating with a possible Revlimid-induced rash.  This medicine was therefore placed on hold and dose reduced  for cycle 2 of therapy (Revlimid to 25 mg every other day x 14 days with 7 day respite).  Pre-chemotherapy labs as ordered.    He feels great!  Somehow, he got off schedule with his Revlimid as he reports today is his last day of Revlimid.  I wrote a new Rx for Revlimid with correct start date.  He will get an extra week off from Revlimid in order to get him back on schedule.    Return in 2 weeks for follow-up.  He is due for Zometa next week.      THERAPY PLAN:  Continue with therapy and monitor for evidence of remission.  All questions were answered. The patient knows to call the clinic with any problems, questions or concerns. We can certainly see the patient much sooner if necessary.  Patient and plan discussed with Dr. Ancil Linsey and she is in agreement with the aforementioned.   This note is electronically signed by: Robynn Pane 05/05/2014 10:27 AM

## 2014-05-05 ENCOUNTER — Encounter (HOSPITAL_BASED_OUTPATIENT_CLINIC_OR_DEPARTMENT_OTHER): Payer: Medicare Other

## 2014-05-05 ENCOUNTER — Encounter (HOSPITAL_COMMUNITY): Payer: Medicare Other | Attending: Hematology and Oncology | Admitting: Oncology

## 2014-05-05 ENCOUNTER — Other Ambulatory Visit (HOSPITAL_COMMUNITY): Payer: Medicare Other

## 2014-05-05 VITALS — BP 122/69 | HR 72 | Temp 97.9°F | Resp 16 | Wt 233.6 lb

## 2014-05-05 DIAGNOSIS — C9000 Multiple myeloma not having achieved remission: Secondary | ICD-10-CM | POA: Diagnosis not present

## 2014-05-05 DIAGNOSIS — K219 Gastro-esophageal reflux disease without esophagitis: Secondary | ICD-10-CM | POA: Diagnosis not present

## 2014-05-05 DIAGNOSIS — N400 Enlarged prostate without lower urinary tract symptoms: Secondary | ICD-10-CM | POA: Insufficient documentation

## 2014-05-05 DIAGNOSIS — R630 Anorexia: Secondary | ICD-10-CM | POA: Insufficient documentation

## 2014-05-05 DIAGNOSIS — Z5112 Encounter for antineoplastic immunotherapy: Secondary | ICD-10-CM

## 2014-05-05 DIAGNOSIS — R634 Abnormal weight loss: Secondary | ICD-10-CM | POA: Insufficient documentation

## 2014-05-05 DIAGNOSIS — M899 Disorder of bone, unspecified: Secondary | ICD-10-CM | POA: Diagnosis not present

## 2014-05-05 DIAGNOSIS — Z923 Personal history of irradiation: Secondary | ICD-10-CM | POA: Insufficient documentation

## 2014-05-05 DIAGNOSIS — M542 Cervicalgia: Secondary | ICD-10-CM | POA: Diagnosis not present

## 2014-05-05 DIAGNOSIS — D472 Monoclonal gammopathy: Secondary | ICD-10-CM | POA: Diagnosis not present

## 2014-05-05 LAB — COMPREHENSIVE METABOLIC PANEL
ALT: 13 U/L (ref 0–53)
AST: 16 U/L (ref 0–37)
Albumin: 2.8 g/dL — ABNORMAL LOW (ref 3.5–5.2)
Alkaline Phosphatase: 24 U/L — ABNORMAL LOW (ref 39–117)
Anion gap: 6 (ref 5–15)
BUN: 14 mg/dL (ref 6–23)
CO2: 23 mmol/L (ref 19–32)
Calcium: 8.1 mg/dL — ABNORMAL LOW (ref 8.4–10.5)
Chloride: 112 mmol/L (ref 96–112)
Creatinine, Ser: 0.58 mg/dL (ref 0.50–1.35)
GFR calc Af Amer: >90 mL/min (ref >90)
GFR calc non Af Amer: >90 mL/min (ref >90)
Glucose, Bld: 107 mg/dL — ABNORMAL HIGH (ref 70–99)
Potassium: 3.8 mmol/L (ref 3.5–5.1)
Sodium: 141 mmol/L (ref 135–145)
Total Bilirubin: 0.6 mg/dL (ref 0.3–1.2)
Total Protein: 5.2 g/dL — ABNORMAL LOW (ref 6.0–8.3)

## 2014-05-05 LAB — CBC WITH DIFFERENTIAL/PLATELET
Basophils Absolute: 0 10*3/uL (ref 0.0–0.1)
Basophils Relative: 0 % (ref 0–1)
Eosinophils Absolute: 0 10*3/uL (ref 0.0–0.7)
Eosinophils Relative: 1 % (ref 0–5)
HCT: 38 % — ABNORMAL LOW (ref 39.0–52.0)
Hemoglobin: 12.7 g/dL — ABNORMAL LOW (ref 13.0–17.0)
Lymphocytes Relative: 13 % (ref 12–46)
Lymphs Abs: 0.5 10*3/uL — ABNORMAL LOW (ref 0.7–4.0)
MCH: 30.3 pg (ref 26.0–34.0)
MCHC: 33.4 g/dL (ref 30.0–36.0)
MCV: 90.7 fL (ref 78.0–100.0)
Monocytes Absolute: 0.4 10*3/uL (ref 0.1–1.0)
Monocytes Relative: 10 % (ref 3–12)
Neutro Abs: 3 10*3/uL (ref 1.7–7.7)
Neutrophils Relative %: 76 % (ref 43–77)
Platelets: 133 10*3/uL — ABNORMAL LOW (ref 150–400)
RBC: 4.19 MIL/uL — ABNORMAL LOW (ref 4.22–5.81)
RDW: 14.5 % (ref 11.5–15.5)
WBC: 3.9 10*3/uL — ABNORMAL LOW (ref 4.0–10.5)

## 2014-05-05 MED ORDER — BORTEZOMIB CHEMO SQ INJECTION 3.5 MG (2.5MG/ML)
1.3000 mg/m2 | Freq: Once | INTRAMUSCULAR | Status: AC
  Administered 2014-05-05: 3 mg via SUBCUTANEOUS
  Filled 2014-05-05: qty 3

## 2014-05-05 MED ORDER — HEPARIN SOD (PORK) LOCK FLUSH 100 UNIT/ML IV SOLN: 500.0000 [IU] | Freq: Once | INTRAVENOUS | Status: DC | PRN

## 2014-05-05 MED ORDER — LENALIDOMIDE 25 MG PO CAPS: 25.0000 mg | ORAL_CAPSULE | ORAL | Status: DC

## 2014-05-05 MED ORDER — ONDANSETRON HCL 4 MG PO TABS
8.0000 mg | ORAL_TABLET | Freq: Once | ORAL | Status: AC
  Administered 2014-05-05: 8 mg via ORAL
  Filled 2014-05-05: qty 2

## 2014-05-05 MED ORDER — SODIUM CHLORIDE 0.9 % IJ SOLN: 10.0000 mL | INTRAMUSCULAR | Status: DC | PRN

## 2014-05-05 MED ORDER — SODIUM CHLORIDE 0.9 % IV SOLN: Freq: Once | INTRAVENOUS | Status: DC

## 2014-05-05 NOTE — Patient Instructions (Signed)
[place] at [facility] Discharge Instructions  RECOMMENDATIONS MADE BY THE CONSULTANT AND ANY TEST RESULTS WILL BE SENT TO YOUR REFERRING PHYSICIAN.  Exam and discussion by Robynn Pane, PA-C. You look great today.   Report fevers, chills, uncontrolled pain, nausea or other concerns. Finish revlimid today and you will restart it on 4/15 and take it as prescribed.  Follow-up next week for Day 15 of Velcade, zometa infusion. Office visit in 2 weeks.  Thank you for choosing [place] at [facility] to provide your oncology and hematology care.  To afford each patient quality time with our provider, please arrive at least 15 minutes before your scheduled appointment time.    You need to re-schedule your appointment should you arrive 10 or more minutes late.  We strive to give you quality time with our providers, and arriving late affects you and other patients whose appointments are after yours.  Also, if you no show three or more times for appointments you may be dismissed from the clinic at the providers discretion.     Again, thank you for choosing [facility].  Our hope is that these requests will decrease the amount of time that you wait before being seen by our physicians.       _____________________________________________________________  Should you have questions after your visit to [facility], please contact our office at [phone] between the hours of 8:30 a.m. and 4:30 p.m.  Voicemails left after 4:30 p.m. will not be returned until the following business day.  For prescription refill requests, have your pharmacy contact our office.

## 2014-05-05 NOTE — Progress Notes (Signed)
Dean [name] presents today for injection per MD orders. velcade administered SQ in left Abdomen. See MAR for administration. Administration without incident. Patient tolerated well.

## 2014-05-10 ENCOUNTER — Other Ambulatory Visit (HOSPITAL_COMMUNITY): Payer: Self-pay | Admitting: Oncology

## 2014-05-10 DIAGNOSIS — K219 Gastro-esophageal reflux disease without esophagitis: Secondary | ICD-10-CM

## 2014-05-10 MED ORDER — OMEPRAZOLE 20 MG PO CPDR: 20.0000 mg | DELAYED_RELEASE_CAPSULE | Freq: Every day | ORAL | Status: DC

## 2014-05-12 ENCOUNTER — Encounter (HOSPITAL_BASED_OUTPATIENT_CLINIC_OR_DEPARTMENT_OTHER): Payer: Medicare Other

## 2014-05-12 ENCOUNTER — Ambulatory Visit (HOSPITAL_COMMUNITY): Payer: Medicare Other

## 2014-05-12 ENCOUNTER — Ambulatory Visit (HOSPITAL_COMMUNITY): Payer: Medicare Other | Admitting: Hematology & Oncology

## 2014-05-12 ENCOUNTER — Encounter (HOSPITAL_COMMUNITY): Payer: Medicare Other

## 2014-05-12 VITALS — BP 133/56 | HR 66 | Temp 97.9°F | Resp 18 | Wt 232.2 lb

## 2014-05-12 DIAGNOSIS — Z5112 Encounter for antineoplastic immunotherapy: Secondary | ICD-10-CM | POA: Diagnosis not present

## 2014-05-12 DIAGNOSIS — C9000 Multiple myeloma not having achieved remission: Secondary | ICD-10-CM

## 2014-05-12 DIAGNOSIS — N400 Enlarged prostate without lower urinary tract symptoms: Secondary | ICD-10-CM | POA: Diagnosis not present

## 2014-05-12 DIAGNOSIS — R630 Anorexia: Secondary | ICD-10-CM | POA: Diagnosis not present

## 2014-05-12 DIAGNOSIS — M899 Disorder of bone, unspecified: Secondary | ICD-10-CM | POA: Diagnosis not present

## 2014-05-12 DIAGNOSIS — K219 Gastro-esophageal reflux disease without esophagitis: Secondary | ICD-10-CM | POA: Diagnosis not present

## 2014-05-12 DIAGNOSIS — R634 Abnormal weight loss: Secondary | ICD-10-CM | POA: Diagnosis not present

## 2014-05-12 DIAGNOSIS — D472 Monoclonal gammopathy: Secondary | ICD-10-CM | POA: Diagnosis not present

## 2014-05-12 LAB — CBC WITH DIFFERENTIAL/PLATELET
Basophils Absolute: 0 10*3/uL (ref 0.0–0.1)
Basophils Relative: 0 % (ref 0–1)
Eosinophils Absolute: 0 10*3/uL (ref 0.0–0.7)
Eosinophils Relative: 1 % (ref 0–5)
HCT: 40.3 % (ref 39.0–52.0)
Hemoglobin: 13.2 g/dL (ref 13.0–17.0)
Lymphocytes Relative: 13 % (ref 12–46)
Lymphs Abs: 0.6 10*3/uL — ABNORMAL LOW (ref 0.7–4.0)
MCH: 30.5 pg (ref 26.0–34.0)
MCHC: 32.8 g/dL (ref 30.0–36.0)
MCV: 93.1 fL (ref 78.0–100.0)
Monocytes Absolute: 0.7 10*3/uL (ref 0.1–1.0)
Monocytes Relative: 15 % — ABNORMAL HIGH (ref 3–12)
Neutro Abs: 3.3 10*3/uL (ref 1.7–7.7)
Neutrophils Relative %: 71 % (ref 43–77)
Platelets: 116 10*3/uL — ABNORMAL LOW (ref 150–400)
RBC: 4.33 MIL/uL (ref 4.22–5.81)
RDW: 14.6 % (ref 11.5–15.5)
WBC: 4.6 10*3/uL (ref 4.0–10.5)

## 2014-05-12 LAB — COMPREHENSIVE METABOLIC PANEL
ALT: 13 U/L (ref 0–53)
AST: 17 U/L (ref 0–37)
Albumin: 3.1 g/dL — ABNORMAL LOW (ref 3.5–5.2)
Alkaline Phosphatase: 26 U/L — ABNORMAL LOW (ref 39–117)
Anion gap: 6 (ref 5–15)
BUN: 12 mg/dL (ref 6–23)
CO2: 25 mmol/L (ref 19–32)
Calcium: 8.3 mg/dL — ABNORMAL LOW (ref 8.4–10.5)
Chloride: 111 mmol/L (ref 96–112)
Creatinine, Ser: 0.72 mg/dL (ref 0.50–1.35)
GFR calc Af Amer: >90 mL/min (ref >90)
GFR calc non Af Amer: 88 mL/min — ABNORMAL LOW (ref >90)
Glucose, Bld: 91 mg/dL (ref 70–99)
Potassium: 4.1 mmol/L (ref 3.5–5.1)
Sodium: 142 mmol/L (ref 135–145)
Total Bilirubin: 0.6 mg/dL (ref 0.3–1.2)
Total Protein: 5.6 g/dL — ABNORMAL LOW (ref 6.0–8.3)

## 2014-05-12 MED ORDER — SODIUM CHLORIDE 0.9 % IV SOLN
4.0000 mg | Freq: Once | INTRAVENOUS | Status: AC
  Administered 2014-05-12: 4 mg via INTRAVENOUS
  Filled 2014-05-12: qty 5

## 2014-05-12 MED ORDER — SODIUM CHLORIDE 0.9 % IJ SOLN
10.0000 mL | INTRAMUSCULAR | Status: DC | PRN
  Administered 2014-05-12: 10 mL
  Filled 2014-05-12: qty 10

## 2014-05-12 MED ORDER — ONDANSETRON HCL 4 MG PO TABS
8.0000 mg | ORAL_TABLET | Freq: Once | ORAL | Status: AC
Start: 2014-05-12 — End: 2014-05-12
  Administered 2014-05-12: 8 mg via ORAL
  Filled 2014-05-12: qty 2

## 2014-05-12 MED ORDER — SODIUM CHLORIDE 0.9 % IV SOLN
Freq: Once | INTRAVENOUS | Status: AC
  Administered 2014-05-12: 11:00:00 via INTRAVENOUS

## 2014-05-12 MED ORDER — BORTEZOMIB CHEMO SQ INJECTION 3.5 MG (2.5MG/ML)
1.3000 mg/m2 | Freq: Once | INTRAMUSCULAR | Status: AC
  Administered 2014-05-12: 3 mg via SUBCUTANEOUS
  Filled 2014-05-12: qty 3

## 2014-05-12 NOTE — Progress Notes (Signed)
Labs drawn

## 2014-05-12 NOTE — Patient Instructions (Signed)
[facility] Discharge Instructions for Patients Receiving Chemotherapy  Today you received the following chemotherapy agents weekly Velcade. You received Zometa infusion today as well. We will continue Zometa infusions monthly. Increase Calcium to 1600 mg daily. (Either take 3 tablets daily of Calcium plus Vitamin D or continue 2 tablets daily but take 2 tablets Tums daily.)  To help prevent nausea and vomiting after your treatment, we encourage you to take your nausea medication as instructed. If you develop nausea and vomiting that is not controlled by your nausea medication, call the clinic. If it is after clinic hours your family physician or the after hours number for the clinic or go to the Emergency Department. BELOW ARE SYMPTOMS THAT SHOULD BE REPORTED IMMEDIATELY:  *FEVER GREATER THAN 101.0 F  *CHILLS WITH OR WITHOUT FEVER  NAUSEA AND VOMITING THAT IS NOT CONTROLLED WITH YOUR NAUSEA MEDICATION  *UNUSUAL SHORTNESS OF BREATH  *UNUSUAL BRUISING OR BLEEDING  TENDERNESS IN MOUTH AND THROAT WITH OR WITHOUT PRESENCE OF ULCERS  *URINARY PROBLEMS  *BOWEL PROBLEMS  UNUSUAL RASH Items with * indicate a potential emergency and should be followed up as soon as possible.  Return as scheduled.  I have been informed and understand all the instructions given to me. I know to contact the clinic, my physician, or go to the Emergency Department if any problems should occur. I do not have any questions at this time, but understand that I may call the clinic during office hours or the Patient Navigator at [phone] should I have any questions or need assistance in obtaining follow up care.    __________________________________________  _____________  __________ Signature of Patient or Authorized Representative            Date                   Time    __________________________________________ Nurse's Signature

## 2014-05-12 NOTE — Progress Notes (Signed)
Tolerated infusion well. 

## 2014-05-13 LAB — BETA 2 MICROGLOBULIN, SERUM: Beta-2 Microglobulin: 2.1 mg/L (ref 0.6–2.4)

## 2014-05-14 LAB — KAPPA/LAMBDA LIGHT CHAINS
Kappa free light chain: 24.86 mg/L — ABNORMAL HIGH (ref 3.30–19.40)
Kappa, lambda light chain ratio: 1.49 (ref 0.26–1.65)
Lambda free light chains: 16.63 mg/L (ref 5.71–26.30)

## 2014-05-17 ENCOUNTER — Encounter: Payer: Self-pay | Admitting: Radiation Oncology

## 2014-05-17 NOTE — Progress Notes (Signed)
  Radiation Oncology         [phone] ________________________________  Name: Dean [name] MRN: [id number]  Date: 05/17/2014  DOB: September 28, 1935  End of Treatment Note  Diagnosis:  Multiple myeloma involving the lumbar spine      Indication for treatment:  Significant involvement in the lumbar spine and associated pain       Radiation treatment dates:   March 16 through March 29  Site/dose:   Lumbar spine and 25 gray in 10 fractions  Beams/energy:   AP PA, 3-D conformal, 10 and 15 x beams  Narrative: The patient tolerated radiation treatment relatively well.   He denied any pain at the completion of treatment. He had some mild nausea with this treatment  Plan: The patient has completed radiation treatment. The patient will return to radiation oncology clinic for routine followup in one month. I advised them to call or return sooner if they have any questions or concerns related to their recovery or treatment.  -----------------------------------  Blair Promise, PhD, MD

## 2014-05-19 ENCOUNTER — Encounter (HOSPITAL_BASED_OUTPATIENT_CLINIC_OR_DEPARTMENT_OTHER): Payer: Medicare Other | Admitting: Hematology & Oncology

## 2014-05-19 ENCOUNTER — Encounter (HOSPITAL_BASED_OUTPATIENT_CLINIC_OR_DEPARTMENT_OTHER): Payer: Medicare Other

## 2014-05-19 ENCOUNTER — Encounter (HOSPITAL_COMMUNITY): Payer: Self-pay | Admitting: Hematology & Oncology

## 2014-05-19 VITALS — BP 132/60 | HR 87 | Temp 97.8°F | Resp 16 | Wt 231.9 lb

## 2014-05-19 DIAGNOSIS — D472 Monoclonal gammopathy: Secondary | ICD-10-CM | POA: Diagnosis not present

## 2014-05-19 DIAGNOSIS — Z5112 Encounter for antineoplastic immunotherapy: Secondary | ICD-10-CM

## 2014-05-19 DIAGNOSIS — R21 Rash and other nonspecific skin eruption: Secondary | ICD-10-CM

## 2014-05-19 DIAGNOSIS — C9000 Multiple myeloma not having achieved remission: Secondary | ICD-10-CM

## 2014-05-19 DIAGNOSIS — R634 Abnormal weight loss: Secondary | ICD-10-CM | POA: Diagnosis not present

## 2014-05-19 DIAGNOSIS — R630 Anorexia: Secondary | ICD-10-CM | POA: Diagnosis not present

## 2014-05-19 DIAGNOSIS — M899 Disorder of bone, unspecified: Secondary | ICD-10-CM | POA: Diagnosis not present

## 2014-05-19 DIAGNOSIS — K219 Gastro-esophageal reflux disease without esophagitis: Secondary | ICD-10-CM | POA: Diagnosis not present

## 2014-05-19 DIAGNOSIS — N400 Enlarged prostate without lower urinary tract symptoms: Secondary | ICD-10-CM | POA: Diagnosis not present

## 2014-05-19 DIAGNOSIS — G4700 Insomnia, unspecified: Secondary | ICD-10-CM | POA: Diagnosis not present

## 2014-05-19 LAB — COMPREHENSIVE METABOLIC PANEL
ALT: 11 U/L (ref 0–53)
AST: 15 U/L (ref 0–37)
Albumin: 2.8 g/dL — ABNORMAL LOW (ref 3.5–5.2)
Alkaline Phosphatase: 26 U/L — ABNORMAL LOW (ref 39–117)
Anion gap: 8 (ref 5–15)
BUN: 15 mg/dL (ref 6–23)
CO2: 23 mmol/L (ref 19–32)
Calcium: 8 mg/dL — ABNORMAL LOW (ref 8.4–10.5)
Chloride: 110 mmol/L (ref 96–112)
Creatinine, Ser: 0.72 mg/dL (ref 0.50–1.35)
GFR calc Af Amer: >90 mL/min (ref >90)
GFR calc non Af Amer: 88 mL/min — ABNORMAL LOW (ref >90)
Glucose, Bld: 105 mg/dL — ABNORMAL HIGH (ref 70–99)
Potassium: 3.5 mmol/L (ref 3.5–5.1)
Sodium: 141 mmol/L (ref 135–145)
Total Bilirubin: 0.5 mg/dL (ref 0.3–1.2)
Total Protein: 5.5 g/dL — ABNORMAL LOW (ref 6.0–8.3)

## 2014-05-19 LAB — LACTATE DEHYDROGENASE: LDH: 138 U/L (ref 94–250)

## 2014-05-19 LAB — CBC WITH DIFFERENTIAL/PLATELET
Basophils Absolute: 0 10*3/uL (ref 0.0–0.1)
Basophils Relative: 1 % (ref 0–1)
Eosinophils Absolute: 0 10*3/uL (ref 0.0–0.7)
Eosinophils Relative: 0 % (ref 0–5)
HCT: 38.3 % — ABNORMAL LOW (ref 39.0–52.0)
Hemoglobin: 12.6 g/dL — ABNORMAL LOW (ref 13.0–17.0)
Lymphocytes Relative: 16 % (ref 12–46)
Lymphs Abs: 0.6 10*3/uL — ABNORMAL LOW (ref 0.7–4.0)
MCH: 29.9 pg (ref 26.0–34.0)
MCHC: 32.9 g/dL (ref 30.0–36.0)
MCV: 91 fL (ref 78.0–100.0)
Monocytes Absolute: 0.4 10*3/uL (ref 0.1–1.0)
Monocytes Relative: 9 % (ref 3–12)
Neutro Abs: 2.9 10*3/uL (ref 1.7–7.7)
Neutrophils Relative %: 74 % (ref 43–77)
Platelets: 132 10*3/uL — ABNORMAL LOW (ref 150–400)
RBC: 4.21 MIL/uL — ABNORMAL LOW (ref 4.22–5.81)
RDW: 14.3 % (ref 11.5–15.5)
WBC: 3.9 10*3/uL — ABNORMAL LOW (ref 4.0–10.5)

## 2014-05-19 LAB — SEDIMENTATION RATE: Sed Rate: 11 mm/hr (ref 0–16)

## 2014-05-19 LAB — MAGNESIUM: Magnesium: 2.1 mg/dL (ref 1.5–2.5)

## 2014-05-19 MED ORDER — BORTEZOMIB CHEMO SQ INJECTION 3.5 MG (2.5MG/ML)
1.3000 mg/m2 | Freq: Once | INTRAMUSCULAR | Status: AC
  Administered 2014-05-19: 3 mg via SUBCUTANEOUS
  Filled 2014-05-19: qty 3

## 2014-05-19 MED ORDER — DOXEPIN HCL 6 MG PO TABS: 6.0000 mg | ORAL_TABLET | Freq: Every day | ORAL | Status: DC

## 2014-05-19 MED ORDER — ONDANSETRON HCL 4 MG PO TABS
ORAL_TABLET | ORAL | Status: AC
  Filled 2014-05-19: qty 2

## 2014-05-19 MED ORDER — ONDANSETRON HCL 4 MG PO TABS
8.0000 mg | ORAL_TABLET | Freq: Once | ORAL | Status: AC
  Administered 2014-05-19: 8 mg via ORAL

## 2014-05-19 MED ORDER — DEXAMETHASONE 4 MG PO TABS: ORAL_TABLET | ORAL | Status: DC

## 2014-05-19 NOTE — Progress Notes (Signed)
1115:  Velcade SQ administration w/o incident - tolerated well.  Please see OV encounter for VS, assessment, and further information.

## 2014-05-19 NOTE — Progress Notes (Signed)
Labs drawn

## 2014-05-19 NOTE — Patient Instructions (Signed)
[place] at [facility] Discharge Instructions  RECOMMENDATIONS MADE BY THE CONSULTANT AND ANY TEST RESULTS WILL BE SENT TO YOUR REFERRING PHYSICIAN.  Exam and discussion with Dr. Whitney Muse. Continue with visits as scheduled.   Increase protein intake.  Boost and Ensure given to try.  Try to add 2 packets of Carnation to milk for breakfast for added protein as well. Return in 1 month to see Dr. Whitney Muse and for lab work. Decadron prescription given to take on days 1, 8, and 15 of chemo. Doxepin prescription given for sleep. Please call with any new symptoms, questions or concerns.     Thank you for choosing [place] at [facility] to provide your oncology and hematology care.  To afford each patient quality time with our provider, please arrive at least 15 minutes before your scheduled appointment time.    You need to re-schedule your appointment should you arrive 10 or more minutes late.  We strive to give you quality time with our providers, and arriving late affects you and other patients whose appointments are after yours.  Also, if you no show three or more times for appointments you may be dismissed from the clinic at the providers discretion.     Again, thank you for choosing [facility].  Our hope is that these requests will decrease the amount of time that you wait before being seen by our physicians.       _____________________________________________________________  Should you have questions after your visit to [facility], please contact our office at [phone] between the hours of 8:30 a.m. and 4:30 p.m.  Voicemails left after 4:30 p.m. will not be returned until the following business day.  For prescription refill requests, have your pharmacy contact our office.

## 2014-05-19 NOTE — Progress Notes (Signed)
Dean Fraction, MD [address] [place] [address]  Multiple myeloma without remission - Plan: cetirizine (ZYRTEC) 10 MG tablet, Doxepin HCl (SILENOR) 6 MG TABS, dexamethasone (DECADRON) 4 MG tablet, Magnesium, Magnesium  Multiple myeloma - Plan: cetirizine (ZYRTEC) 10 MG tablet, Doxepin HCl (SILENOR) 6 MG TABS, dexamethasone (DECADRON) 4 MG tablet, Magnesium, Magnesium   Left occipital condyle infiltrative lesion, not amenable to biopsy. Palliative XRT without a tissue diagnosis  Chest x-ray and right rib films were obtained which showed a soft tissue mass along the right posterior fifth rib with some rib destruction.  Monoclonal IgG kappa, normal IgG levels, suppressed IgM, abnormal kappa/lamda ratio  PET/CT  03/16/2014 Scattered hypermetabolic osseous lesions, including the calvarium, ribs, left scapula, sacrum, and left femoral shaft. An expansile left vertebral body lesion at L3 extends into the epidural space of the left lateral recess, and probably merits lumbar spine MRI assessment.  BMBX on 02/20/2014 with 33% kappa restricted plasma cells Normal FISH, normal cytogenetics  Peripheral evaluation shows very small M spike, normal CBC, normal CMP, myeloma survey grossly underestimates extent of bone involvement  CURRENT THERAPY: RVD beginning on 04/07/2014  INTERVAL HISTORY: Dean [name] 79 y.o. male returns for followup of multiple myeloma, monoclonal IgG kappa with scattered osseous lesions.  He complains of difficulty sleeping. It is a chronic issue but he thinks it has gotten worse. He has tried Ambien with no relief. He had Xanax that was given to him by Dean [name] thinks this helped some but it was still "nonideal" his daughter is inquiring if he can drink boost plus. He is taking his Revlimid but every other day because of development of rash. He has intermittent constipation is not tried stool softeners routinely. He has fatigue which is a major  complaint but realistically seems no worse than prior to beginning treatment.    Multiple myeloma without remission   11/16/2012 Initial Diagnosis L occipital condyle destructive lesion, not biopsied secondary to location. XRT   12/01/2012 Imaging L3 superior endplate compression FX, no lytic lesions to suggest myeloma   02/06/2014 Imaging soft tissue mass along the right posterior fifth rib with some rib destruction   02/16/2014 Miscellaneous Normal CBC, Normal CMP, kappa,lamda ratio of 2.62 (abnormal), UIEP with slightly restricted band, IgG kappa, SPEP/IEP with monoclonal protein at 0.79 g/dl, normal igG, suppressed IGM   02/20/2014 Bone Marrow Biopsy Normal cytogenetics and myeloma FISH profile.   02/21/2014 - 03/08/2014 Radiation Therapy 30Gy to R rib lesion, lesion not biopsied   03/16/2014 PET scan Scattered hypermetabolic osseous lesions, including the calvarium, ribs, left scapula, sacrum, and left femoral shaft. An expansile left vertebral body lesion at L3 extends into the epidural space of the left lateral recess,    03/17/2014 - 04/07/2014 Chemotherapy Velcade and Dexamethasone due to initial denial of Revlimid by insurance company.  Zometa monthly.   03/22/2014 Imaging MR_ L-Spine- Enhancing lesions compatible with multiple myeloma at L2, L3, and S1-2.   04/07/2014 -  Chemotherapy RVD with Revlimid at 25 mg days 1-14.  Revlimid dose reduced to 25 mg every other day x 14 days with 7 day respite beginning on day 1 of cycle 2.   04/17/2014 Adverse Reaction Revlimid-induced rash.  Treated with steroids.     Past Medical History  Diagnosis Date  . Neuropathy   . BPH (benign prostatic hyperplasia)   . GERD (gastroesophageal reflux disease)   . H/O ETOH abuse   .  Skull lesion     Left occipital condyle  . History of radiation therapy 01/05/13-02/10/13    45 gray to left occipital condyle region  . Multiple myeloma   . Radiation 02/21/14-03/08/14    right posterior chest wall area 30 gray    has  Neuropathy; BPH (benign prostatic hyperplasia); GERD (gastroesophageal reflux disease); H/O ETOH abuse; Skull lesion; Neck pain; Fatigue; Loss of weight; Insomnia; Allergic rhinitis; Lumbar compression fracture; Dermatitis; and Multiple myeloma without remission on his problem list.     is allergic to codeine and morphine and related.  [name] had no medications administered during this visit.  Past Surgical History  Procedure Laterality Date  . Cyst excision  1959    tail bone    Denies any headaches, dizziness, double vision, fevers, chills, night sweats, nausea, vomiting, diarrhea, constipation, chest pain, heart palpitations, shortness of breath, blood in stool, black tarry stool, urinary pain, urinary burning, urinary frequency, hematuria.   PHYSICAL EXAMINATION  ECOG PERFORMANCE STATUS: 0 - Asymptomatic  Filed Vitals:   05/19/14 0857  BP: 132/60  Pulse: 87  Temp: 97.8 F (36.6 C)  Resp: 16    GENERAL:alert, no distress, well nourished, well developed, comfortable, cooperative, obese and smiling SKIN: skin color, texture, turgor are normal, no rashes or significant lesions HEAD: Normocephalic, No masses, lesions, tenderness or abnormalities EYES: normal, PERRLA, EOMI, Conjunctiva are pink and non-injected EARS: External ears normal OROPHARYNX:lips, buccal mucosa, and tongue normal and mucous membranes are moist  NECK: supple, no adenopathy, thyroid normal size, non-tender, without nodularity, no stridor, non-tender, trachea midline LYMPH:  no palpable lymphadenopathy BREAST:not examined LUNGS: clear to auscultation  HEART: regular rate & rhythm, no murmurs and no gallops ABDOMEN:abdomen soft, non-tender, obese and normal bowel sounds BACK: Back symmetric, no curvature., No CVA tenderness EXTREMITIES:less then 2 second capillary refill, no joint deformities, effusion, or inflammation, no edema, no skin discoloration, no clubbing, no cyanosis  NEURO: alert & oriented x  3 with fluent speech, no focal motor/sensory deficits, gait normal   LABORATORY DATA: CBC    Component Value Date/Time   WBC 3.9* 05/19/2014 0942   RBC 4.21* 05/19/2014 0942   HGB 12.6* 05/19/2014 0942   HCT 38.3* 05/19/2014 0942   PLT 132* 05/19/2014 0942   MCV 91.0 05/19/2014 0942   MCH 29.9 05/19/2014 0942   MCHC 32.9 05/19/2014 0942   RDW 14.3 05/19/2014 0942   LYMPHSABS 0.6* 05/19/2014 0942   MONOABS 0.4 05/19/2014 0942   EOSABS 0.0 05/19/2014 0942   BASOSABS 0.0 05/19/2014 0942      Chemistry      Component Value Date/Time   NA 141 05/19/2014 0942   K 3.5 05/19/2014 0942   CL 110 05/19/2014 0942   CO2 23 05/19/2014 0942   BUN 15 05/19/2014 0942   CREATININE 0.72 05/19/2014 0942   CREATININE 0.76 02/06/2014 1249      Component Value Date/Time   CALCIUM 8.0* 05/19/2014 0942   ALKPHOS 26* 05/19/2014 0942   AST 15 05/19/2014 0942   ALT 11 05/19/2014 0942   BILITOT 0.5 05/19/2014 0942     Lab Results  Component Value Date   PROT 5.5* 05/19/2014   ALBUMINELP 52.2* 04/07/2014   A1GS 6.7* 04/07/2014   A2GS 15.4* 04/07/2014   BETS 6.0 04/07/2014   BETA2SER 5.3 04/07/2014   GAMS 14.4 04/07/2014   MSPIKE 0.45 04/07/2014   SPEI (NOTE) 04/07/2014   SPECOM (NOTE) 04/07/2014   IGGSERUM 470* 04/27/2014   IGA 85  04/27/2014   IGMSERUM 20 04/27/2014   IMMELINT (NOTE) 04/07/2014   KPAFRELGTCHN 24.86* 05/12/2014   LAMBDASER 16.63 05/12/2014   KAPLAMBRATIO 1.49 05/12/2014     ASSESSMENT AND PLAN:   IgG kappa myeloma Multiple bone lesions seen on PET imaging RVD therapy Rash on revlimid, currently on every other day dosing Insomnia   We will continue forward with his current therapy. He needs refills on his dexamethasone. He is on an adult aspirin daily. To continue his acyclovir. At his next follow-up we will consider increasing his Revlimid to daily dosing as he has had no additional difficulties with rash. We will continue with Zometa as prescribed. The goal  is for 6 cycles of RVD and reassessment afterwards.  I have given him a prescription for doxepin 10 mg to try nightly for his sleep. We will continue with ongoing close observation and plan on seeing him back again in several weeks. We will continue monitoring his peripheral studies. At some point I anticipate needing to reevaluate his disease with pet imaging. We can address this at follow-up.  Orders Placed This Encounter  Procedures  . Magnesium    Standing Status: Future     Number of Occurrences: 1     Standing Expiration Date: 05/19/2015   All questions were answered. The patient knows to call the clinic with any problems, questions or concerns. We can certainly see the patient much sooner if necessary.    This note is electronically signed by: Molli Hazard MD 05/19/2014 11:10 AM

## 2014-05-20 LAB — BETA 2 MICROGLOBULIN, SERUM: Beta-2 Microglobulin: 1.8 mg/L (ref 0.6–2.4)

## 2014-05-21 LAB — KAPPA/LAMBDA LIGHT CHAINS
Kappa free light chain: 19.79 mg/L — ABNORMAL HIGH (ref 3.30–19.40)
Kappa, lambda light chain ratio: 1.48 (ref 0.26–1.65)
Lambda free light chains: 13.35 mg/L (ref 5.71–26.30)

## 2014-05-22 LAB — MULTIPLE MYELOMA PANEL, SERUM
Albumin SerPl Elph-Mcnc: 2.6 g/dL — ABNORMAL LOW (ref 3.2–5.6)
Albumin/Glob SerPl: 1.2 (ref 0.7–2.0)
Alpha 1: 0.3 g/dL (ref 0.1–0.4)
Alpha2 Glob SerPl Elph-Mcnc: 0.8 g/dL (ref 0.4–1.2)
B-Globulin SerPl Elph-Mcnc: 0.8 g/dL (ref 0.6–1.3)
Gamma Glob SerPl Elph-Mcnc: 0.3 g/dL — ABNORMAL LOW (ref 0.5–1.6)
Globulin, Total: 2.2 g/dL (ref 2.0–4.5)
IgA: 62 mg/dL (ref 61–437)
IgG (Immunoglobin G), Serum: 319 mg/dL — ABNORMAL LOW (ref 700–1600)
IgM, Serum: 20 mg/dL (ref 15–143)
M Protein SerPl Elph-Mcnc: 0.1 g/dL — ABNORMAL HIGH
Total Protein ELP: 4.8 g/dL — ABNORMAL LOW (ref 6.0–8.5)

## 2014-05-25 ENCOUNTER — Encounter (HOSPITAL_BASED_OUTPATIENT_CLINIC_OR_DEPARTMENT_OTHER): Payer: Medicare Other

## 2014-05-25 ENCOUNTER — Encounter (HOSPITAL_COMMUNITY): Payer: Self-pay

## 2014-05-25 ENCOUNTER — Encounter (HOSPITAL_COMMUNITY): Payer: Medicare Other

## 2014-05-25 VITALS — BP 109/87 | HR 80 | Temp 98.4°F | Resp 20 | Wt 227.0 lb

## 2014-05-25 DIAGNOSIS — Z5112 Encounter for antineoplastic immunotherapy: Secondary | ICD-10-CM | POA: Diagnosis present

## 2014-05-25 DIAGNOSIS — R630 Anorexia: Secondary | ICD-10-CM | POA: Diagnosis not present

## 2014-05-25 DIAGNOSIS — N400 Enlarged prostate without lower urinary tract symptoms: Secondary | ICD-10-CM | POA: Diagnosis not present

## 2014-05-25 DIAGNOSIS — M899 Disorder of bone, unspecified: Secondary | ICD-10-CM | POA: Diagnosis not present

## 2014-05-25 DIAGNOSIS — K219 Gastro-esophageal reflux disease without esophagitis: Secondary | ICD-10-CM | POA: Diagnosis not present

## 2014-05-25 DIAGNOSIS — C9000 Multiple myeloma not having achieved remission: Secondary | ICD-10-CM

## 2014-05-25 DIAGNOSIS — R634 Abnormal weight loss: Secondary | ICD-10-CM | POA: Diagnosis not present

## 2014-05-25 DIAGNOSIS — D472 Monoclonal gammopathy: Secondary | ICD-10-CM | POA: Diagnosis not present

## 2014-05-25 LAB — COMPREHENSIVE METABOLIC PANEL
ALT: 12 U/L (ref 0–53)
AST: 16 U/L (ref 0–37)
Albumin: 3.3 g/dL — ABNORMAL LOW (ref 3.5–5.2)
Alkaline Phosphatase: 28 U/L — ABNORMAL LOW (ref 39–117)
Anion gap: 7 (ref 5–15)
BUN: 22 mg/dL (ref 6–23)
CO2: 25 mmol/L (ref 19–32)
Calcium: 8.9 mg/dL (ref 8.4–10.5)
Chloride: 107 mmol/L (ref 96–112)
Creatinine, Ser: 0.77 mg/dL (ref 0.50–1.35)
GFR calc Af Amer: >90 mL/min (ref >90)
GFR calc non Af Amer: 86 mL/min — ABNORMAL LOW (ref >90)
Glucose, Bld: 93 mg/dL (ref 70–99)
Potassium: 4.4 mmol/L (ref 3.5–5.1)
Sodium: 139 mmol/L (ref 135–145)
Total Bilirubin: 0.6 mg/dL (ref 0.3–1.2)
Total Protein: 5.9 g/dL — ABNORMAL LOW (ref 6.0–8.3)

## 2014-05-25 LAB — CBC WITH DIFFERENTIAL/PLATELET
Basophils Absolute: 0 10*3/uL (ref 0.0–0.1)
Basophils Relative: 0 % (ref 0–1)
Eosinophils Absolute: 0.1 10*3/uL (ref 0.0–0.7)
Eosinophils Relative: 1 % (ref 0–5)
HCT: 42.6 % (ref 39.0–52.0)
Hemoglobin: 13.8 g/dL (ref 13.0–17.0)
Lymphocytes Relative: 22 % (ref 12–46)
Lymphs Abs: 1.4 10*3/uL (ref 0.7–4.0)
MCH: 29.7 pg (ref 26.0–34.0)
MCHC: 32.4 g/dL (ref 30.0–36.0)
MCV: 91.8 fL (ref 78.0–100.0)
Monocytes Absolute: 0.8 10*3/uL (ref 0.1–1.0)
Monocytes Relative: 12 % (ref 3–12)
Neutro Abs: 4.2 10*3/uL (ref 1.7–7.7)
Neutrophils Relative %: 65 % (ref 43–77)
Platelets: 149 10*3/uL — ABNORMAL LOW (ref 150–400)
RBC: 4.64 MIL/uL (ref 4.22–5.81)
RDW: 14.8 % (ref 11.5–15.5)
WBC: 6.4 10*3/uL (ref 4.0–10.5)

## 2014-05-25 MED ORDER — ONDANSETRON HCL 4 MG PO TABS
8.0000 mg | ORAL_TABLET | Freq: Once | ORAL | Status: AC
  Administered 2014-05-25: 8 mg via ORAL

## 2014-05-25 MED ORDER — BORTEZOMIB CHEMO SQ INJECTION 3.5 MG (2.5MG/ML)
1.3000 mg/m2 | Freq: Once | INTRAMUSCULAR | Status: AC
  Administered 2014-05-25: 3 mg via SUBCUTANEOUS
  Filled 2014-05-25: qty 3

## 2014-05-25 MED ORDER — ONDANSETRON HCL 4 MG PO TABS
ORAL_TABLET | ORAL | Status: AC
  Filled 2014-05-25: qty 2

## 2014-05-25 NOTE — Progress Notes (Signed)
[name] Tolerated chemotherapy injection well today in abdomen sq

## 2014-05-25 NOTE — Patient Instructions (Signed)
[facility] Discharge Instructions for Patients Receiving Chemotherapy  Today you received the following chemotherapy agents velcade Follow up as scheduled Please call the clinic if you have any questions or concerns  To help prevent nausea and vomiting after your treatment, we encourage you to take your nausea medication    If you develop nausea and vomiting that is not controlled by your nausea medication, call the clinic. If it is after clinic hours your family physician or the after hours number for the clinic or go to the Emergency Department.   BELOW ARE SYMPTOMS THAT SHOULD BE REPORTED IMMEDIATELY:  *FEVER GREATER THAN 101.0 F  *CHILLS WITH OR WITHOUT FEVER  NAUSEA AND VOMITING THAT IS NOT CONTROLLED WITH YOUR NAUSEA MEDICATION  *UNUSUAL SHORTNESS OF BREATH  *UNUSUAL BRUISING OR BLEEDING  TENDERNESS IN MOUTH AND THROAT WITH OR WITHOUT PRESENCE OF ULCERS  *URINARY PROBLEMS  *BOWEL PROBLEMS  UNUSUAL RASH Items with * indicate a potential emergency and should be followed up as soon as possible.  One of the nurses will contact you 24 hours after your treatment. Please let the nurse know about any problems that you may have experienced. Feel free to call the clinic you have any questions or concerns. The clinic phone number is [phone].   I have been informed and understand all the instructions given to me. I know to contact the clinic, my physician, or go to the Emergency Department if any problems should occur. I do not have any questions at this time, but understand that I may call the clinic during office hours or the Patient Navigator at [phone] should I have any questions or need assistance in obtaining follow up care.

## 2014-05-30 ENCOUNTER — Other Ambulatory Visit (HOSPITAL_COMMUNITY): Payer: Self-pay

## 2014-06-02 ENCOUNTER — Encounter (HOSPITAL_BASED_OUTPATIENT_CLINIC_OR_DEPARTMENT_OTHER): Payer: Medicare Other

## 2014-06-02 VITALS — BP 130/66 | HR 66 | Temp 98.2°F | Resp 18 | Wt 231.4 lb

## 2014-06-02 DIAGNOSIS — R634 Abnormal weight loss: Secondary | ICD-10-CM | POA: Diagnosis not present

## 2014-06-02 DIAGNOSIS — M899 Disorder of bone, unspecified: Secondary | ICD-10-CM | POA: Diagnosis not present

## 2014-06-02 DIAGNOSIS — R630 Anorexia: Secondary | ICD-10-CM | POA: Diagnosis not present

## 2014-06-02 DIAGNOSIS — C9000 Multiple myeloma not having achieved remission: Secondary | ICD-10-CM

## 2014-06-02 DIAGNOSIS — Z5112 Encounter for antineoplastic immunotherapy: Secondary | ICD-10-CM

## 2014-06-02 DIAGNOSIS — N400 Enlarged prostate without lower urinary tract symptoms: Secondary | ICD-10-CM | POA: Diagnosis not present

## 2014-06-02 DIAGNOSIS — K219 Gastro-esophageal reflux disease without esophagitis: Secondary | ICD-10-CM | POA: Diagnosis not present

## 2014-06-02 DIAGNOSIS — D472 Monoclonal gammopathy: Secondary | ICD-10-CM | POA: Diagnosis not present

## 2014-06-02 LAB — CBC WITH DIFFERENTIAL/PLATELET
Basophils Absolute: 0 10*3/uL (ref 0.0–0.1)
Basophils Relative: 0 % (ref 0–1)
Eosinophils Absolute: 0.1 10*3/uL (ref 0.0–0.7)
Eosinophils Relative: 1 % (ref 0–5)
HCT: 39 % (ref 39.0–52.0)
Hemoglobin: 12.9 g/dL — ABNORMAL LOW (ref 13.0–17.0)
Lymphocytes Relative: 22 % (ref 12–46)
Lymphs Abs: 1.6 10*3/uL (ref 0.7–4.0)
MCH: 30.3 pg (ref 26.0–34.0)
MCHC: 33.1 g/dL (ref 30.0–36.0)
MCV: 91.5 fL (ref 78.0–100.0)
Monocytes Absolute: 1.2 10*3/uL — ABNORMAL HIGH (ref 0.1–1.0)
Monocytes Relative: 16 % — ABNORMAL HIGH (ref 3–12)
Neutro Abs: 4.6 10*3/uL (ref 1.7–7.7)
Neutrophils Relative %: 61 % (ref 43–77)
Platelets: 124 10*3/uL — ABNORMAL LOW (ref 150–400)
RBC: 4.26 MIL/uL (ref 4.22–5.81)
RDW: 14.7 % (ref 11.5–15.5)
WBC: 7.4 10*3/uL (ref 4.0–10.5)

## 2014-06-02 LAB — COMPREHENSIVE METABOLIC PANEL
ALT: 12 U/L (ref 0–53)
AST: 15 U/L (ref 0–37)
Albumin: 3.2 g/dL — ABNORMAL LOW (ref 3.5–5.2)
Alkaline Phosphatase: 27 U/L — ABNORMAL LOW (ref 39–117)
Anion gap: 7 (ref 5–15)
BUN: 19 mg/dL (ref 6–23)
CO2: 25 mmol/L (ref 19–32)
Calcium: 8.6 mg/dL (ref 8.4–10.5)
Chloride: 109 mmol/L (ref 96–112)
Creatinine, Ser: 0.59 mg/dL (ref 0.50–1.35)
GFR calc Af Amer: >90 mL/min (ref >90)
GFR calc non Af Amer: >90 mL/min (ref >90)
Glucose, Bld: 88 mg/dL (ref 70–99)
Potassium: 4.1 mmol/L (ref 3.5–5.1)
Sodium: 141 mmol/L (ref 135–145)
Total Bilirubin: 0.5 mg/dL (ref 0.3–1.2)
Total Protein: 5.6 g/dL — ABNORMAL LOW (ref 6.0–8.3)

## 2014-06-02 MED ORDER — ONDANSETRON HCL 4 MG PO TABS
8.0000 mg | ORAL_TABLET | Freq: Once | ORAL | Status: AC
  Administered 2014-06-02: 8 mg via ORAL
  Filled 2014-06-02: qty 2

## 2014-06-02 MED ORDER — BORTEZOMIB CHEMO SQ INJECTION 3.5 MG (2.5MG/ML)
1.3000 mg/m2 | Freq: Once | INTRAMUSCULAR | Status: AC
  Administered 2014-06-02: 3 mg via SUBCUTANEOUS
  Filled 2014-06-02: qty 3

## 2014-06-02 NOTE — Patient Instructions (Signed)
[facility] Discharge Instructions for Patients Receiving Chemotherapy  Today you received the following chemotherapy agents Bortezomid injection.  To help prevent nausea and vomiting after your treatment, we encourage you to take your nausea medication as instructed. If you develop nausea and vomiting that is not controlled by your nausea medication, call the clinic. If it is after clinic hours your family physician or the after hours number for the clinic or go to the Emergency Department. BELOW ARE SYMPTOMS THAT SHOULD BE REPORTED IMMEDIATELY:  *FEVER GREATER THAN 101.0 F  *CHILLS WITH OR WITHOUT FEVER  NAUSEA AND VOMITING THAT IS NOT CONTROLLED WITH YOUR NAUSEA MEDICATION  *UNUSUAL SHORTNESS OF BREATH  *UNUSUAL BRUISING OR BLEEDING  TENDERNESS IN MOUTH AND THROAT WITH OR WITHOUT PRESENCE OF ULCERS  *URINARY PROBLEMS  *BOWEL PROBLEMS  UNUSUAL RASH Items with * indicate a potential emergency and should be followed up as soon as possible.  Return as scheduled.  I have been informed and understand all the instructions given to me. I know to contact the clinic, my physician, or go to the Emergency Department if any problems should occur. I do not have any questions at this time, but understand that I may call the clinic during office hours or the Patient Navigator at [phone] should I have any questions or need assistance in obtaining follow up care.    __________________________________________  _____________  __________ Signature of Patient or Authorized Representative            Date                   Time    __________________________________________ Nurse's Signature

## 2014-06-05 NOTE — Progress Notes (Signed)
Labs drawn

## 2014-06-06 ENCOUNTER — Other Ambulatory Visit (HOSPITAL_COMMUNITY): Payer: Self-pay | Admitting: Oncology

## 2014-06-06 DIAGNOSIS — C9000 Multiple myeloma not having achieved remission: Secondary | ICD-10-CM

## 2014-06-06 MED ORDER — LENALIDOMIDE 25 MG PO CAPS: 25.0000 mg | ORAL_CAPSULE | ORAL | Status: DC

## 2014-06-08 ENCOUNTER — Other Ambulatory Visit: Payer: Self-pay | Admitting: Family Medicine

## 2014-06-08 NOTE — Telephone Encounter (Signed)
Refill appropriate and filled per protocol. 

## 2014-06-09 ENCOUNTER — Encounter (HOSPITAL_COMMUNITY): Payer: Medicare Other | Attending: Hematology and Oncology

## 2014-06-09 ENCOUNTER — Encounter (HOSPITAL_COMMUNITY): Payer: Medicare Other

## 2014-06-09 ENCOUNTER — Encounter (HOSPITAL_COMMUNITY): Payer: Self-pay

## 2014-06-09 VITALS — BP 156/69 | HR 95 | Temp 97.4°F | Resp 18 | Wt 232.4 lb

## 2014-06-09 DIAGNOSIS — K219 Gastro-esophageal reflux disease without esophagitis: Secondary | ICD-10-CM | POA: Diagnosis not present

## 2014-06-09 DIAGNOSIS — R630 Anorexia: Secondary | ICD-10-CM | POA: Insufficient documentation

## 2014-06-09 DIAGNOSIS — D472 Monoclonal gammopathy: Secondary | ICD-10-CM | POA: Insufficient documentation

## 2014-06-09 DIAGNOSIS — N400 Enlarged prostate without lower urinary tract symptoms: Secondary | ICD-10-CM | POA: Insufficient documentation

## 2014-06-09 DIAGNOSIS — M542 Cervicalgia: Secondary | ICD-10-CM | POA: Diagnosis not present

## 2014-06-09 DIAGNOSIS — R634 Abnormal weight loss: Secondary | ICD-10-CM | POA: Diagnosis not present

## 2014-06-09 DIAGNOSIS — C9000 Multiple myeloma not having achieved remission: Secondary | ICD-10-CM | POA: Diagnosis not present

## 2014-06-09 DIAGNOSIS — M899 Disorder of bone, unspecified: Secondary | ICD-10-CM | POA: Diagnosis not present

## 2014-06-09 DIAGNOSIS — Z5112 Encounter for antineoplastic immunotherapy: Secondary | ICD-10-CM

## 2014-06-09 DIAGNOSIS — Z923 Personal history of irradiation: Secondary | ICD-10-CM | POA: Diagnosis not present

## 2014-06-09 LAB — CBC WITH DIFFERENTIAL/PLATELET
Basophils Absolute: 0 10*3/uL (ref 0.0–0.1)
Basophils Relative: 0 % (ref 0–1)
Eosinophils Absolute: 0 10*3/uL (ref 0.0–0.7)
Eosinophils Relative: 0 % (ref 0–5)
HCT: 39.9 % (ref 39.0–52.0)
Hemoglobin: 13.2 g/dL (ref 13.0–17.0)
Lymphocytes Relative: 18 % (ref 12–46)
Lymphs Abs: 1 10*3/uL (ref 0.7–4.0)
MCH: 30.1 pg (ref 26.0–34.0)
MCHC: 33.1 g/dL (ref 30.0–36.0)
MCV: 91.1 fL (ref 78.0–100.0)
Monocytes Absolute: 0.8 10*3/uL (ref 0.1–1.0)
Monocytes Relative: 15 % — ABNORMAL HIGH (ref 3–12)
Neutro Abs: 3.6 10*3/uL (ref 1.7–7.7)
Neutrophils Relative %: 67 % (ref 43–77)
Platelets: 131 10*3/uL — ABNORMAL LOW (ref 150–400)
RBC: 4.38 MIL/uL (ref 4.22–5.81)
RDW: 14.6 % (ref 11.5–15.5)
WBC: 5.4 10*3/uL (ref 4.0–10.5)

## 2014-06-09 LAB — COMPREHENSIVE METABOLIC PANEL
ALT: 12 U/L — ABNORMAL LOW (ref 17–63)
AST: 18 U/L (ref 15–41)
Albumin: 3.2 g/dL — ABNORMAL LOW (ref 3.5–5.0)
Alkaline Phosphatase: 26 U/L — ABNORMAL LOW (ref 38–126)
Anion gap: 8 (ref 5–15)
BUN: 19 mg/dL (ref 6–20)
CO2: 23 mmol/L (ref 22–32)
Calcium: 8.5 mg/dL — ABNORMAL LOW (ref 8.9–10.3)
Chloride: 108 mmol/L (ref 101–111)
Creatinine, Ser: 0.66 mg/dL (ref 0.61–1.24)
GFR calc Af Amer: >60 mL/min (ref >60)
GFR calc non Af Amer: >60 mL/min (ref >60)
Glucose, Bld: 93 mg/dL (ref 70–99)
Potassium: 4.6 mmol/L (ref 3.5–5.1)
Sodium: 139 mmol/L (ref 135–145)
Total Bilirubin: 0.5 mg/dL (ref 0.3–1.2)
Total Protein: 5.8 g/dL — ABNORMAL LOW (ref 6.5–8.1)

## 2014-06-09 LAB — LACTATE DEHYDROGENASE: LDH: 162 U/L (ref 98–192)

## 2014-06-09 LAB — SEDIMENTATION RATE: Sed Rate: 5 mm/hr (ref 0–16)

## 2014-06-09 MED ORDER — SODIUM CHLORIDE 0.9 % IV SOLN
Freq: Once | INTRAVENOUS | Status: AC
  Administered 2014-06-09: 11:00:00 via INTRAVENOUS

## 2014-06-09 MED ORDER — SODIUM CHLORIDE 0.9 % IV SOLN: Freq: Once | INTRAVENOUS | Status: DC

## 2014-06-09 MED ORDER — BORTEZOMIB CHEMO SQ INJECTION 3.5 MG (2.5MG/ML)
1.3000 mg/m2 | Freq: Once | INTRAMUSCULAR | Status: AC
  Administered 2014-06-09: 3 mg via SUBCUTANEOUS
  Filled 2014-06-09: qty 3

## 2014-06-09 MED ORDER — ONDANSETRON HCL 4 MG PO TABS
8.0000 mg | ORAL_TABLET | Freq: Once | ORAL | Status: AC
  Administered 2014-06-09: 8 mg via ORAL

## 2014-06-09 MED ORDER — SODIUM CHLORIDE 0.9 % IJ SOLN: 10.0000 mL | INTRAMUSCULAR | Status: DC | PRN

## 2014-06-09 MED ORDER — ONDANSETRON HCL 4 MG PO TABS
ORAL_TABLET | ORAL | Status: AC
  Filled 2014-06-09: qty 2

## 2014-06-09 MED ORDER — ZOLEDRONIC ACID 4 MG/5ML IV CONC
4.0000 mg | Freq: Once | INTRAVENOUS | Status: AC
  Administered 2014-06-09: 4 mg via INTRAVENOUS
  Filled 2014-06-09: qty 5

## 2014-06-09 MED ORDER — HEPARIN SOD (PORK) LOCK FLUSH 100 UNIT/ML IV SOLN: 500.0000 [IU] | Freq: Once | INTRAVENOUS | Status: DC | PRN

## 2014-06-09 NOTE — Progress Notes (Signed)
Tolerated zometa and velcade without problems

## 2014-06-09 NOTE — Progress Notes (Signed)
Labs drawn

## 2014-06-09 NOTE — Patient Instructions (Signed)
..  [facility] Discharge Instructions for Patients Receiving Chemotherapy  Today you received the following chemotherapy agents velcade and zometa.  Continue revlimid and dexamethasone as ordered  If you develop nausea and vomiting, or diarrhea that is not controlled by your medication, call the clinic.  The clinic phone number is [phone]. Office hours are Monday-Friday 8:30am-5:00pm.  BELOW ARE SYMPTOMS THAT SHOULD BE REPORTED IMMEDIATELY:  *FEVER GREATER THAN 101.0 F  *CHILLS WITH OR WITHOUT FEVER  NAUSEA AND VOMITING THAT IS NOT CONTROLLED WITH YOUR NAUSEA MEDICATION  *UNUSUAL SHORTNESS OF BREATH  *UNUSUAL BRUISING OR BLEEDING  TENDERNESS IN MOUTH AND THROAT WITH OR WITHOUT PRESENCE OF ULCERS  *URINARY PROBLEMS  *BOWEL PROBLEMS  UNUSUAL RASH Items with * indicate a potential emergency and should be followed up as soon as possible. If you have an emergency after office hours please contact your primary care physician or go to the nearest emergency department.  Please call the clinic during office hours if you have any questions or concerns.   You may also contact the Patient Navigator at [phone] should you have any questions or need assistance in obtaining follow up care. _____________________________________________________________________ Have you asked about our STAR program?    STAR stands for Survivorship Training and Rehabilitation, and this is a nationally recognized cancer care program that focuses on survivorship and rehabilitation.  Cancer and cancer treatments may cause problems, such as, pain, making you feel tired and keeping you from doing the things that you need or want to do. Cancer rehabilitation can help. Our goal is to reduce these troubling effects and help you have the best quality of life possible.  You may receive a survey from a nurse that asks questions about your current state of health.  Based on the survey results, all  eligible patients will be referred to the [facility] program for an evaluation so we can better serve you! A frequently asked questions sheet is available upon request.

## 2014-06-09 NOTE — Progress Notes (Signed)
.  Dean [name] tolerated velcade and zometa without problems

## 2014-06-11 LAB — BETA 2 MICROGLOBULIN, SERUM: Beta-2 Microglobulin: 2 mg/L (ref 0.6–2.4)

## 2014-06-11 LAB — KAPPA/LAMBDA LIGHT CHAINS
Kappa free light chain: 24.25 mg/L — ABNORMAL HIGH (ref 3.30–19.40)
Kappa, lambda light chain ratio: 2 — ABNORMAL HIGH (ref 0.26–1.65)
Lambda free light chains: 12.13 mg/L (ref 5.71–26.30)

## 2014-06-13 ENCOUNTER — Other Ambulatory Visit (HOSPITAL_COMMUNITY): Payer: Self-pay

## 2014-06-13 LAB — MULTIPLE MYELOMA PANEL, SERUM
Albumin SerPl Elph-Mcnc: 2.7 g/dL — ABNORMAL LOW (ref 3.2–5.6)
Albumin/Glob SerPl: 1.2 (ref 0.7–2.0)
Alpha 1: 0.3 g/dL (ref 0.1–0.4)
Alpha2 Glob SerPl Elph-Mcnc: 0.8 g/dL (ref 0.4–1.2)
B-Globulin SerPl Elph-Mcnc: 0.9 g/dL (ref 0.6–1.3)
Gamma Glob SerPl Elph-Mcnc: 0.3 g/dL — ABNORMAL LOW (ref 0.5–1.6)
Globulin, Total: 2.3 g/dL (ref 2.0–4.5)
IgA: 69 mg/dL (ref 61–437)
IgG (Immunoglobin G), Serum: 404 mg/dL — ABNORMAL LOW (ref 700–1600)
IgM, Serum: 22 mg/dL (ref 15–143)
M Protein SerPl Elph-Mcnc: 0.1 g/dL — ABNORMAL HIGH
Total Protein ELP: 5 g/dL — ABNORMAL LOW (ref 6.0–8.5)

## 2014-06-14 ENCOUNTER — Encounter (HOSPITAL_COMMUNITY): Payer: Self-pay | Admitting: Hematology & Oncology

## 2014-06-16 ENCOUNTER — Encounter (HOSPITAL_BASED_OUTPATIENT_CLINIC_OR_DEPARTMENT_OTHER): Payer: Medicare Other | Admitting: Hematology & Oncology

## 2014-06-16 ENCOUNTER — Encounter (HOSPITAL_BASED_OUTPATIENT_CLINIC_OR_DEPARTMENT_OTHER): Payer: Medicare Other

## 2014-06-16 ENCOUNTER — Encounter (HOSPITAL_COMMUNITY): Payer: Self-pay | Admitting: Hematology & Oncology

## 2014-06-16 ENCOUNTER — Other Ambulatory Visit (HOSPITAL_COMMUNITY): Payer: Self-pay

## 2014-06-16 VITALS — BP 129/52 | HR 71 | Temp 98.2°F | Resp 18 | Wt 235.4 lb

## 2014-06-16 DIAGNOSIS — C9000 Multiple myeloma not having achieved remission: Secondary | ICD-10-CM

## 2014-06-16 DIAGNOSIS — N400 Enlarged prostate without lower urinary tract symptoms: Secondary | ICD-10-CM | POA: Diagnosis not present

## 2014-06-16 DIAGNOSIS — M899 Disorder of bone, unspecified: Secondary | ICD-10-CM | POA: Diagnosis not present

## 2014-06-16 DIAGNOSIS — R634 Abnormal weight loss: Secondary | ICD-10-CM | POA: Diagnosis not present

## 2014-06-16 DIAGNOSIS — R630 Anorexia: Secondary | ICD-10-CM | POA: Diagnosis not present

## 2014-06-16 DIAGNOSIS — R21 Rash and other nonspecific skin eruption: Secondary | ICD-10-CM | POA: Diagnosis not present

## 2014-06-16 DIAGNOSIS — K219 Gastro-esophageal reflux disease without esophagitis: Secondary | ICD-10-CM | POA: Diagnosis not present

## 2014-06-16 DIAGNOSIS — D472 Monoclonal gammopathy: Secondary | ICD-10-CM | POA: Diagnosis not present

## 2014-06-16 LAB — CBC WITH DIFFERENTIAL/PLATELET
Basophils Absolute: 0 10*3/uL (ref 0.0–0.1)
Basophils Relative: 0 % (ref 0–1)
Eosinophils Absolute: 0 10*3/uL (ref 0.0–0.7)
Eosinophils Relative: 1 % (ref 0–5)
HCT: 37.8 % — ABNORMAL LOW (ref 39.0–52.0)
Hemoglobin: 12.4 g/dL — ABNORMAL LOW (ref 13.0–17.0)
Lymphocytes Relative: 22 % (ref 12–46)
Lymphs Abs: 1.3 10*3/uL (ref 0.7–4.0)
MCH: 29.8 pg (ref 26.0–34.0)
MCHC: 32.8 g/dL (ref 30.0–36.0)
MCV: 90.9 fL (ref 78.0–100.0)
Monocytes Absolute: 0.5 10*3/uL (ref 0.1–1.0)
Monocytes Relative: 9 % (ref 3–12)
Neutro Abs: 3.8 10*3/uL (ref 1.7–7.7)
Neutrophils Relative %: 68 % (ref 43–77)
Platelets: 139 10*3/uL — ABNORMAL LOW (ref 150–400)
RBC: 4.16 MIL/uL — ABNORMAL LOW (ref 4.22–5.81)
RDW: 15 % (ref 11.5–15.5)
WBC: 5.6 10*3/uL (ref 4.0–10.5)

## 2014-06-16 LAB — COMPREHENSIVE METABOLIC PANEL
ALT: 14 U/L — ABNORMAL LOW (ref 17–63)
AST: 14 U/L — ABNORMAL LOW (ref 15–41)
Albumin: 3.2 g/dL — ABNORMAL LOW (ref 3.5–5.0)
Alkaline Phosphatase: 28 U/L — ABNORMAL LOW (ref 38–126)
Anion gap: 7 (ref 5–15)
BUN: 14 mg/dL (ref 6–20)
CO2: 25 mmol/L (ref 22–32)
Calcium: 8.4 mg/dL — ABNORMAL LOW (ref 8.9–10.3)
Chloride: 107 mmol/L (ref 101–111)
Creatinine, Ser: 0.59 mg/dL — ABNORMAL LOW (ref 0.61–1.24)
GFR calc Af Amer: >60 mL/min (ref >60)
GFR calc non Af Amer: >60 mL/min (ref >60)
Glucose, Bld: 97 mg/dL (ref 65–99)
Potassium: 4.1 mmol/L (ref 3.5–5.1)
Sodium: 139 mmol/L (ref 135–145)
Total Bilirubin: 0.3 mg/dL (ref 0.3–1.2)
Total Protein: 5.5 g/dL — ABNORMAL LOW (ref 6.5–8.1)

## 2014-06-16 MED ORDER — ONDANSETRON HCL 4 MG PO TABS
ORAL_TABLET | ORAL | Status: AC
  Filled 2014-06-16: qty 2

## 2014-06-16 MED ORDER — SODIUM CHLORIDE 0.9 % IV SOLN: Freq: Once | INTRAVENOUS | Status: DC

## 2014-06-16 MED ORDER — BORTEZOMIB CHEMO SQ INJECTION 3.5 MG (2.5MG/ML)
1.3000 mg/m2 | Freq: Once | INTRAMUSCULAR | Status: AC
  Administered 2014-06-16: 3 mg via SUBCUTANEOUS
  Filled 2014-06-16: qty 3

## 2014-06-16 MED ORDER — LENALIDOMIDE 25 MG PO CAPS: 25.0000 mg | ORAL_CAPSULE | Freq: Every day | ORAL | Status: DC

## 2014-06-16 MED ORDER — ONDANSETRON HCL 4 MG PO TABS
8.0000 mg | ORAL_TABLET | Freq: Once | ORAL | Status: AC
  Administered 2014-06-16: 8 mg via ORAL

## 2014-06-16 NOTE — Patient Instructions (Signed)
..  [place] at [facility] Discharge Instructions  RECOMMENDATIONS MADE BY THE CONSULTANT AND ANY TEST RESULTS WILL BE SENT TO YOUR REFERRING PHYSICIAN.  Exam today with Dr. Whitney Muse. Continue treatment as planned. New prescription for Revlimid.  Please take as prescribed. Follow up appointment in 2 weeks. Please call for any new or worsening symptoms, questions, or concerns.   Thank you for choosing [place] at [facility] to provide your oncology and hematology care.  To afford each patient quality time with our provider, please arrive at least 15 minutes before your scheduled appointment time.    You need to re-schedule your appointment should you arrive 10 or more minutes late.  We strive to give you quality time with our providers, and arriving late affects you and other patients whose appointments are after yours.  Also, if you no show three or more times for appointments you may be dismissed from the clinic at the providers discretion.     Again, thank you for choosing [facility].  Our hope is that these requests will decrease the amount of time that you wait before being seen by our physicians.       _____________________________________________________________  Should you have questions after your visit to [facility], please contact our office at [phone] between the hours of 8:30 a.m. and 4:30 p.m.  Voicemails left after 4:30 p.m. will not be returned until the following business day.  For prescription refill requests, have your pharmacy contact our office.

## 2014-06-16 NOTE — Progress Notes (Signed)
Dean Fraction, MD [address] [place] [address] [place] [address]  Multiple Myeloma, IgG kappa  Left occipital condyle infiltrative lesion, not amenable to biopsy. Palliative XRT without a tissue diagnosis  Chest x-ray and right rib films were obtained which showed a soft tissue mass along the right posterior fifth rib with some rib destruction.  Monoclonal IgG kappa, normal IgG levels, suppressed IgM, abnormal kappa/lamda ratio  PET/CT  03/16/2014 Scattered hypermetabolic osseous lesions, including the calvarium, ribs, left scapula, sacrum, and left femoral shaft. An expansile left vertebral body lesion at L3 extends into the epidural space of the left lateral recess, and probably merits lumbar spine MRI assessment.  BMBX on 02/20/2014 with 33% kappa restricted plasma cells Normal FISH, normal cytogenetics  Peripheral evaluation shows very small M spike, normal CBC, normal CMP, myeloma survey grossly underestimates extent of bone involvement  CURRENT THERAPY: RVD beginning on 04/07/2014  INTERVAL HISTORY: Dean [name] 79 y.o. male returns for followup of multiple myeloma, monoclonal IgG kappa with scattered osseous lesions.  He is eating well. He reports alternating swelling of his ankles. He is taking his calcium supplements. His sleep has improved with nightly doxepin. Quinine water has alleviated his cramping. He has no major complaints today. He is compliant with his medication. He will finish this cycle of Revlimid on Wednesday the 18th.     Multiple myeloma without remission   11/16/2012 Initial Diagnosis L occipital condyle destructive lesion, not biopsied secondary to location. XRT   12/01/2012 Imaging L3 superior endplate compression FX, no lytic lesions to suggest myeloma   02/06/2014 Imaging soft tissue mass along the right posterior fifth rib with some rib destruction   02/16/2014 Miscellaneous Normal CBC, Normal CMP, kappa,lamda ratio of 2.62 (abnormal),  UIEP with slightly restricted band, IgG kappa, SPEP/IEP with monoclonal protein at 0.79 g/dl, normal igG, suppressed IGM   02/20/2014 Bone Marrow Biopsy Normal cytogenetics and myeloma FISH profile.   02/21/2014 - 03/08/2014 Radiation Therapy 30Gy to R rib lesion, lesion not biopsied   03/16/2014 PET scan Scattered hypermetabolic osseous lesions, including the calvarium, ribs, left scapula, sacrum, and left femoral shaft. An expansile left vertebral body lesion at L3 extends into the epidural space of the left lateral recess,    03/17/2014 - 04/07/2014 Chemotherapy Velcade and Dexamethasone due to initial denial of Revlimid by insurance company.  Zometa monthly.   03/22/2014 Imaging MR_ L-Spine- Enhancing lesions compatible with multiple myeloma at L2, L3, and S1-2.   04/07/2014 -  Chemotherapy RVD with Revlimid at 25 mg days 1-14.  Revlimid dose reduced to 25 mg every other day x 14 days with 7 day respite beginning on day 1 of cycle 2.   04/17/2014 Adverse Reaction Revlimid-induced rash.  Treated with steroids.     Past Medical History  Diagnosis Date  . Neuropathy   . BPH (benign prostatic hyperplasia)   . GERD (gastroesophageal reflux disease)   . H/O ETOH abuse   . Skull lesion     Left occipital condyle  . History of radiation therapy 01/05/13-02/10/13    45 gray to left occipital condyle region  . Multiple myeloma   . Radiation 02/21/14-03/08/14    right posterior chest wall area 30 gray    has Neuropathy; BPH (benign prostatic hyperplasia); GERD (gastroesophageal reflux disease); H/O ETOH abuse; Skull lesion; Neck pain; Fatigue; Loss of weight; Insomnia; Allergic rhinitis; Lumbar compression fracture; Dermatitis; and Multiple myeloma without remission on his problem list.  is allergic to codeine and morphine and related.  Mr. Emmerich had no medications administered during this visit.  Past Surgical History  Procedure Laterality Date  . Cyst excision  1959    tail bone    Denies any  headaches, dizziness, double vision, fevers, chills, night sweats, nausea, vomiting, diarrhea, constipation, chest pain, heart palpitations, shortness of breath, blood in stool, black tarry stool, urinary pain, urinary burning, urinary frequency, hematuria.   PHYSICAL EXAMINATION  ECOG PERFORMANCE STATUS: 0 - Asymptomatic  Filed Vitals:   06/16/14 1031  BP: 129/52  Pulse: 71  Temp: 98.2 F (36.8 C)  Resp: 18    GENERAL:alert, no distress, well nourished, well developed, comfortable, cooperative, obese and smiling SKIN: skin color, texture, turgor are normal, no rashes or significant lesions HEAD: Normocephalic, No masses, lesions, tenderness or abnormalities EYES: normal, PERRLA, EOMI, Conjunctiva are pink and non-injected EARS: External ears normal OROPHARYNX:lips, buccal mucosa, and tongue normal and mucous membranes are moist  NECK: supple, no adenopathy, thyroid normal size, non-tender, without nodularity, no stridor, non-tender, trachea midline LYMPH:  no palpable lymphadenopathy BREAST:not examined LUNGS: clear to auscultation  HEART: regular rate & rhythm, no murmurs and no gallops ABDOMEN:abdomen soft, non-tender, obese and normal bowel sounds BACK: Back symmetric, no curvature., No CVA tenderness EXTREMITIES:less then 2 second capillary refill, no joint deformities, effusion, or inflammation, no edema, no skin discoloration, no clubbing, no cyanosis  NEURO: alert & oriented x 3 with fluent speech, no focal motor/sensory deficits, gait normal   LABORATORY DATA: CBC    Component Value Date/Time   WBC 5.6 06/16/2014 1012   RBC 4.16* 06/16/2014 1012   HGB 12.4* 06/16/2014 1012   HCT 37.8* 06/16/2014 1012   PLT 139* 06/16/2014 1012   MCV 90.9 06/16/2014 1012   MCH 29.8 06/16/2014 1012   MCHC 32.8 06/16/2014 1012   RDW 15.0 06/16/2014 1012   LYMPHSABS 1.3 06/16/2014 1012   MONOABS 0.5 06/16/2014 1012   EOSABS 0.0 06/16/2014 1012   BASOSABS 0.0 06/16/2014 1012       Chemistry      Component Value Date/Time   NA 139 06/16/2014 1012   K 4.1 06/16/2014 1012   CL 107 06/16/2014 1012   CO2 25 06/16/2014 1012   BUN 14 06/16/2014 1012   CREATININE 0.59* 06/16/2014 1012   CREATININE 0.76 02/06/2014 1249      Component Value Date/Time   CALCIUM 8.4* 06/16/2014 1012   ALKPHOS 28* 06/16/2014 1012   AST 14* 06/16/2014 1012   ALT 14* 06/16/2014 1012   BILITOT 0.3 06/16/2014 1012     Lab Results  Component Value Date   PROT 5.5* 06/16/2014   ALBUMINELP 52.2* 04/07/2014   A1GS 6.7* 04/07/2014   A2GS 15.4* 04/07/2014   BETS 6.0 04/07/2014   BETA2SER 5.3 04/07/2014   GAMS 14.4 04/07/2014   MSPIKE 0.45 04/07/2014   SPEI (NOTE) 04/07/2014   SPECOM (NOTE) 04/07/2014   IGGSERUM 404* 06/09/2014   IGA 69 06/09/2014   IGMSERUM 22 06/09/2014   IMMELINT (NOTE) 04/07/2014   KPAFRELGTCHN 24.25* 06/09/2014   LAMBDASER 12.13 06/09/2014   KAPLAMBRATIO 2.00* 06/09/2014     ASSESSMENT AND PLAN:   IgG kappa myeloma Multiple bone lesions seen on PET imaging RVD therapy Rash on revlimid, currently on every other day dosing Insomnia   We will continue forward with his current therapy. He is to continue his dexamethasone. He is on an adult aspirin daily. To continue his acyclovir. We will continue with Zometa  as prescribed. He has been advised to increase his revlimid to daily dosing.  The goal is for 6 cycles of RVD and reassessment afterwards.  He is sleeping better with the use of doxepin. He is fairly active and without major complaints today. We will continue with ongoing observation with return to the clinic in 2 weeks. If he develops a rash from his increased dose of revlimid he knows to call.  All questions were answered. The patient knows to call the clinic with any problems, questions or concerns. We can certainly see the patient much sooner if necessary.  This note is electronically signed by:  This document serves as a record of services  personally performed by Ancil Linsey, MD. It was created on her behalf by Pearlie Oyster, a trained medical scribe. The creation of this record is based on the scribe's personal observations and the provider's statements to them. This document has been checked and approved by the attending provider.    I have reviewed the above documentation for accuracy and completeness, and I agree with the above.  Kelby Fam. Penland MD

## 2014-06-16 NOTE — Patient Instructions (Signed)
[facility] Discharge Instructions for Patients Receiving Chemotherapy  Today you received the following chemotherapy agents weekly Velcade injection.  To help prevent nausea and vomiting after your treatment, we encourage you to take your nausea medication as instructed. If you develop nausea and vomiting that is not controlled by your nausea medication, call the clinic. If it is after clinic hours your family physician or the after hours number for the clinic or go to the Emergency Department. BELOW ARE SYMPTOMS THAT SHOULD BE REPORTED IMMEDIATELY:  *FEVER GREATER THAN 101.0 F  *CHILLS WITH OR WITHOUT FEVER  NAUSEA AND VOMITING THAT IS NOT CONTROLLED WITH YOUR NAUSEA MEDICATION  *UNUSUAL SHORTNESS OF BREATH  *UNUSUAL BRUISING OR BLEEDING  TENDERNESS IN MOUTH AND THROAT WITH OR WITHOUT PRESENCE OF ULCERS  *URINARY PROBLEMS  *BOWEL PROBLEMS  UNUSUAL RASH Items with * indicate a potential emergency and should be followed up as soon as possible.  Return as scheduled.  I have been informed and understand all the instructions given to me. I know to contact the clinic, my physician, or go to the Emergency Department if any problems should occur. I do not have any questions at this time, but understand that I may call the clinic during office hours or the Patient Navigator at [phone] should I have any questions or need assistance in obtaining follow up care.    __________________________________________  _____________  __________ Signature of Patient or Authorized Representative            Date                   Time    __________________________________________ Nurse's Signature

## 2014-06-16 NOTE — Progress Notes (Signed)
Labs drawn

## 2014-06-19 ENCOUNTER — Encounter: Payer: Self-pay | Admitting: Oncology

## 2014-06-21 ENCOUNTER — Ambulatory Visit
Admission: RE | Admit: 2014-06-21 | Discharge: 2014-06-21 | Disposition: A | Discharge status: Home / Self Care | Payer: Medicare Other | Source: Ambulatory Visit | Attending: Radiation Oncology | Admitting: Radiation Oncology

## 2014-06-21 ENCOUNTER — Encounter: Payer: Self-pay | Admitting: Radiation Oncology

## 2014-06-21 VITALS — BP 139/73 | HR 86 | Temp 97.9°F | Resp 20 | Ht 68.0 in | Wt 236.8 lb

## 2014-06-21 DIAGNOSIS — C9000 Multiple myeloma not having achieved remission: Secondary | ICD-10-CM

## 2014-06-21 NOTE — Progress Notes (Addendum)
Follow up s/p Lumbar spine 04/1614-/05/02/14 no c/o pain, but appetite fair, no taste or smell stated, energylevel fair weak stated, drinks carnation instant breakfast 2 x day,  No nausea, patient stated I'm not going to have a bone marrow transplant" started revlimid again, zometa, velcade next chemo 06/23/14 8:07 AM

## 2014-06-21 NOTE — Progress Notes (Signed)
Radiation Oncology         [phone] ________________________________  Name: Dean [name] MRN: [id number]  Date: 06/21/2014  DOB: November 29, 1935  Follow-Up Visit Note  CC: [name], MD  [name], MD    ICD-9-CM ICD-10-CM   1. Multiple myeloma without remission 203.00 C90.00     Diagnosis:   Multiple myeloma involving the lumbar spine  Interval Since Last Radiation:  6  weeks  Narrative:  The patient returns today for routine follow-up.  He denies any pain in his chest or low back area. He continues on medication for his multiple myeloma. This has caused him to have poor taste and no desire for food. He understands the importance of nutrition. The patient is deciding at this time whether he will proceed with a bone marrow transplant at a later date.  He has some generalized fatigue. He denies any weakness in his lower extremities or numbness.                              ALLERGIES:  is allergic to codeine and morphine and related.  Meds: Current Outpatient Prescriptions  Medication Sig Dispense Refill  . aspirin EC 325 MG tablet Take 1 tablet (325 mg total) by mouth daily. 30 tablet 11  . bortezomib IV (VELCADE) 3.5 MG injection Inject 1.3 mg/m2 into the vein. Days 1, 8, 15 every 28 days    . Calcium Carb-Cholecalciferol (CALCIUM-VITAMIN D3) 600-500 MG-UNIT CAPS Take 1 tablet by mouth 2 (two) times daily. With food 60 capsule 6  . cetirizine (ZYRTEC) 10 MG tablet Take 10 mg by mouth daily.    Marland Kitchen dexamethasone (DECADRON) 4 MG tablet Take 10 tablets (67m total) on Days 1, 8, & 15 of chemo. 70 tablet 1  . diphenhydrAMINE (BENADRYL) 25 MG tablet Take 25 mg by mouth at bedtime as needed for sleep.    .Marland Kitchengabapentin (NEURONTIN) 400 MG capsule Take 1 capsule (400 mg total) by mouth 4 (four) times daily. May take up to five times daily depending on pain in feet (Patient taking differently: Take 400 mg by mouth 2 (two) times daily as needed (for neuropathy pain). May take  up to five times daily depending on pain in feet) 180 capsule 5  . lenalidomide (REVLIMID) 25 MG capsule Take 1 capsule (25 mg total) by mouth daily. 14 capsule 6  . ondansetron (ZOFRAN) 8 MG tablet Take 1 tablet (8 mg total) by mouth every 8 (eight) hours as needed for nausea or vomiting. 30 tablet 2  . tamsulosin (FLOMAX) 0.4 MG CAPS capsule TAKE 1 CAPSULE AT BEDTIME 30 capsule 3  . Zoledronic Acid (ZOMETA IV) Inject into the vein. Every 28 days    . acyclovir (ZOVIRAX) 400 MG tablet Take 1 tablet (400 mg total) by mouth 2 (two) times daily. (Patient not taking: Reported on 06/21/2014) 60 tablet 5  . ALPRAZolam (XANAX) 1 MG tablet Take 1-2 tablets (1-2 mg total) by mouth at bedtime as needed for sleep. For anxiety and/or sleep (Patient not taking: Reported on 06/09/2014) 60 tablet 2  . Aspirin-Salicylamide-Caffeine (BC HEADACHE POWDER PO) Take 1 packet by mouth 2 (two) times daily as needed.    . clotrimazole (LOTRIMIN) 1 % cream Apply 1 application topically 2 (two) times daily. To ear (Patient not taking: Reported on 06/21/2014) 45 g 2  . Doxepin HCl (SILENOR) 6 MG TABS Take 1 tablet (6 mg total) by mouth  at bedtime. (Patient not taking: Reported on 06/02/2014) 30 tablet 6  . omeprazole (PRILOSEC) 20 MG capsule Take 1 capsule (20 mg total) by mouth daily. (Patient not taking: Reported on 06/09/2014) 30 capsule 5  . oxyCODONE-acetaminophen (PERCOCET) 10-325 MG per tablet Take 1 tablet by mouth every 4 (four) hours as needed for pain. (Patient not taking: Reported on 06/09/2014) 60 tablet 0  . zolpidem (AMBIEN) 5 MG tablet Take 1-2 tablets (5-10 mg total) by mouth at bedtime as needed for sleep. (Patient not taking: Reported on 05/19/2014) 60 tablet 1   No current facility-administered medications for this encounter.    Physical Findings: The patient is in no acute distress. Patient is alert and oriented.  height is 5' 8"  (1.727 m) and weight is 236 lb 12.8 oz (107.412 kg). His oral temperature is 97.9 F  (36.6 C). His blood pressure is 139/73 and his pulse is 86. His respiration is 20 and oxygen saturation is 97%. .  The lungs are clear. The heart has a regular rhythm and rate. The abdomen is soft and nontender with normal bowel sounds. No palpable supraclavicular adenopathy. Lower motor strength is 5 out of 5 in the proximal and distal muscle groups.  No tenderness with palpation along the chest or lower back area.  Lab Findings: Lab Results  Component Value Date   WBC 5.6 06/16/2014   HGB 12.4* 06/16/2014   HCT 37.8* 06/16/2014   MCV 90.9 06/16/2014   PLT 139* 06/16/2014    Radiographic Findings: No results found.  Impression:  The patient is recovering from the effects of radiation.  No further pain at the treatment sites.  Plan:  When necessary follow-up in radiation oncology. The patient will continue close follow-up in medical oncology with active treatment as above. ____________________________________ Blair Promise, MD

## 2014-06-23 ENCOUNTER — Encounter (HOSPITAL_BASED_OUTPATIENT_CLINIC_OR_DEPARTMENT_OTHER): Payer: Medicare Other

## 2014-06-23 ENCOUNTER — Encounter (HOSPITAL_COMMUNITY): Payer: Self-pay

## 2014-06-23 VITALS — BP 135/71 | HR 71 | Temp 98.3°F | Resp 18 | Wt 237.5 lb

## 2014-06-23 DIAGNOSIS — D472 Monoclonal gammopathy: Secondary | ICD-10-CM | POA: Diagnosis not present

## 2014-06-23 DIAGNOSIS — R630 Anorexia: Secondary | ICD-10-CM | POA: Diagnosis not present

## 2014-06-23 DIAGNOSIS — N400 Enlarged prostate without lower urinary tract symptoms: Secondary | ICD-10-CM | POA: Diagnosis not present

## 2014-06-23 DIAGNOSIS — C9000 Multiple myeloma not having achieved remission: Secondary | ICD-10-CM

## 2014-06-23 DIAGNOSIS — Z5112 Encounter for antineoplastic immunotherapy: Secondary | ICD-10-CM | POA: Diagnosis present

## 2014-06-23 DIAGNOSIS — R634 Abnormal weight loss: Secondary | ICD-10-CM | POA: Diagnosis not present

## 2014-06-23 DIAGNOSIS — K219 Gastro-esophageal reflux disease without esophagitis: Secondary | ICD-10-CM | POA: Diagnosis not present

## 2014-06-23 DIAGNOSIS — M899 Disorder of bone, unspecified: Secondary | ICD-10-CM | POA: Diagnosis not present

## 2014-06-23 LAB — CBC WITH DIFFERENTIAL/PLATELET
Basophils Absolute: 0 10*3/uL (ref 0.0–0.1)
Basophils Relative: 0 % (ref 0–1)
Eosinophils Absolute: 0.1 10*3/uL (ref 0.0–0.7)
Eosinophils Relative: 1 % (ref 0–5)
HCT: 39.8 % (ref 39.0–52.0)
Hemoglobin: 13 g/dL (ref 13.0–17.0)
Lymphocytes Relative: 23 % (ref 12–46)
Lymphs Abs: 1.6 10*3/uL (ref 0.7–4.0)
MCH: 30 pg (ref 26.0–34.0)
MCHC: 32.7 g/dL (ref 30.0–36.0)
MCV: 91.7 fL (ref 78.0–100.0)
Monocytes Absolute: 1.2 10*3/uL — ABNORMAL HIGH (ref 0.1–1.0)
Monocytes Relative: 16 % — ABNORMAL HIGH (ref 3–12)
Neutro Abs: 4.4 10*3/uL (ref 1.7–7.7)
Neutrophils Relative %: 60 % (ref 43–77)
Platelets: 144 10*3/uL — ABNORMAL LOW (ref 150–400)
RBC: 4.34 MIL/uL (ref 4.22–5.81)
RDW: 15 % (ref 11.5–15.5)
WBC: 7.3 10*3/uL (ref 4.0–10.5)

## 2014-06-23 LAB — COMPREHENSIVE METABOLIC PANEL
ALT: 13 U/L — ABNORMAL LOW (ref 17–63)
AST: 16 U/L (ref 15–41)
Albumin: 3.2 g/dL — ABNORMAL LOW (ref 3.5–5.0)
Alkaline Phosphatase: 31 U/L — ABNORMAL LOW (ref 38–126)
Anion gap: 5 (ref 5–15)
BUN: 16 mg/dL (ref 6–20)
CO2: 24 mmol/L (ref 22–32)
Calcium: 8.5 mg/dL — ABNORMAL LOW (ref 8.9–10.3)
Chloride: 108 mmol/L (ref 101–111)
Creatinine, Ser: 0.66 mg/dL (ref 0.61–1.24)
GFR calc Af Amer: >60 mL/min (ref >60)
GFR calc non Af Amer: >60 mL/min (ref >60)
Glucose, Bld: 97 mg/dL (ref 65–99)
Potassium: 4.5 mmol/L (ref 3.5–5.1)
Sodium: 137 mmol/L (ref 135–145)
Total Bilirubin: 0.6 mg/dL (ref 0.3–1.2)
Total Protein: 5.7 g/dL — ABNORMAL LOW (ref 6.5–8.1)

## 2014-06-23 MED ORDER — BORTEZOMIB CHEMO SQ INJECTION 3.5 MG (2.5MG/ML)
1.3000 mg/m2 | Freq: Once | INTRAMUSCULAR | Status: AC
  Administered 2014-06-23: 3 mg via SUBCUTANEOUS
  Filled 2014-06-23: qty 3

## 2014-06-23 MED ORDER — ONDANSETRON HCL 4 MG PO TABS
8.0000 mg | ORAL_TABLET | Freq: Once | ORAL | Status: AC
  Administered 2014-06-23: 8 mg via ORAL
  Filled 2014-06-23: qty 2

## 2014-06-23 NOTE — Progress Notes (Signed)
Labs drawn

## 2014-06-23 NOTE — Progress Notes (Signed)
Dean [name] Tolerated chemotherapy injection well today Discharged ambulatory

## 2014-06-23 NOTE — Patient Instructions (Signed)
[facility] Discharge Instructions for Patients Receiving Chemotherapy  Today you received the following chemotherapy agents velcade Follow up as scheduled Call the clinic if you have any questions or concerns  To help prevent nausea and vomiting after your treatment, we encourage you to take your nausea medication  If you develop nausea and vomiting, or diarrhea that is not controlled by your medication, call the clinic.  The clinic phone number is [phone]. Office hours are Monday-Friday 8:30am-5:00pm.  BELOW ARE SYMPTOMS THAT SHOULD BE REPORTED IMMEDIATELY:  *FEVER GREATER THAN 101.0 F  *CHILLS WITH OR WITHOUT FEVER  NAUSEA AND VOMITING THAT IS NOT CONTROLLED WITH YOUR NAUSEA MEDICATION  *UNUSUAL SHORTNESS OF BREATH  *UNUSUAL BRUISING OR BLEEDING  TENDERNESS IN MOUTH AND THROAT WITH OR WITHOUT PRESENCE OF ULCERS  *URINARY PROBLEMS  *BOWEL PROBLEMS  UNUSUAL RASH Items with * indicate a potential emergency and should be followed up as soon as possible. If you have an emergency after office hours please contact your primary care physician or go to the nearest emergency department.  Please call the clinic during office hours if you have any questions or concerns.   You may also contact the Patient Navigator at [phone] should you have any questions or need assistance in obtaining follow up care. _____________________________________________________________________ Have you asked about our STAR program?    STAR stands for Survivorship Training and Rehabilitation, and this is a nationally recognized cancer care program that focuses on survivorship and rehabilitation.  Cancer and cancer treatments may cause problems, such as, pain, making you feel tired and keeping you from doing the things that you need or want to do. Cancer rehabilitation can help. Our goal is to reduce these troubling effects and help you have the best quality of life possible.  You  may receive a survey from a nurse that asks questions about your current state of health.  Based on the survey results, all eligible patients will be referred to the [facility] program for an evaluation so we can better serve you! A frequently asked questions sheet is available upon request.

## 2014-06-29 ENCOUNTER — Encounter (HOSPITAL_BASED_OUTPATIENT_CLINIC_OR_DEPARTMENT_OTHER): Payer: Medicare Other

## 2014-06-29 ENCOUNTER — Encounter (HOSPITAL_COMMUNITY): Payer: Self-pay | Admitting: Oncology

## 2014-06-29 ENCOUNTER — Encounter (HOSPITAL_BASED_OUTPATIENT_CLINIC_OR_DEPARTMENT_OTHER): Payer: Medicare Other | Admitting: Oncology

## 2014-06-29 VITALS — BP 121/79 | HR 71 | Temp 97.6°F | Resp 18 | Wt 233.8 lb

## 2014-06-29 DIAGNOSIS — C9000 Multiple myeloma not having achieved remission: Secondary | ICD-10-CM

## 2014-06-29 DIAGNOSIS — K219 Gastro-esophageal reflux disease without esophagitis: Secondary | ICD-10-CM | POA: Diagnosis not present

## 2014-06-29 DIAGNOSIS — Z5112 Encounter for antineoplastic immunotherapy: Secondary | ICD-10-CM | POA: Diagnosis present

## 2014-06-29 DIAGNOSIS — M899 Disorder of bone, unspecified: Secondary | ICD-10-CM | POA: Diagnosis not present

## 2014-06-29 DIAGNOSIS — R634 Abnormal weight loss: Secondary | ICD-10-CM | POA: Diagnosis not present

## 2014-06-29 DIAGNOSIS — N400 Enlarged prostate without lower urinary tract symptoms: Secondary | ICD-10-CM | POA: Diagnosis not present

## 2014-06-29 DIAGNOSIS — R630 Anorexia: Secondary | ICD-10-CM | POA: Diagnosis not present

## 2014-06-29 DIAGNOSIS — D472 Monoclonal gammopathy: Secondary | ICD-10-CM | POA: Diagnosis not present

## 2014-06-29 LAB — COMPREHENSIVE METABOLIC PANEL
ALT: 13 U/L — ABNORMAL LOW (ref 17–63)
AST: 17 U/L (ref 15–41)
Albumin: 3.5 g/dL (ref 3.5–5.0)
Alkaline Phosphatase: 25 U/L — ABNORMAL LOW (ref 38–126)
Anion gap: 9 (ref 5–15)
BUN: 23 mg/dL — ABNORMAL HIGH (ref 6–20)
CO2: 26 mmol/L (ref 22–32)
Calcium: 8.5 mg/dL — ABNORMAL LOW (ref 8.9–10.3)
Chloride: 104 mmol/L (ref 101–111)
Creatinine, Ser: 0.6 mg/dL — ABNORMAL LOW (ref 0.61–1.24)
GFR calc Af Amer: >60 mL/min (ref >60)
GFR calc non Af Amer: >60 mL/min (ref >60)
Glucose, Bld: 95 mg/dL (ref 65–99)
Potassium: 4.3 mmol/L (ref 3.5–5.1)
Sodium: 139 mmol/L (ref 135–145)
Total Bilirubin: 0.6 mg/dL (ref 0.3–1.2)
Total Protein: 6.2 g/dL — ABNORMAL LOW (ref 6.5–8.1)

## 2014-06-29 LAB — CBC WITH DIFFERENTIAL/PLATELET
Basophils Absolute: 0 10*3/uL (ref 0.0–0.1)
Basophils Relative: 0 % (ref 0–1)
Eosinophils Absolute: 0 10*3/uL (ref 0.0–0.7)
Eosinophils Relative: 0 % (ref 0–5)
HCT: 39.8 % (ref 39.0–52.0)
Hemoglobin: 13.1 g/dL (ref 13.0–17.0)
Lymphocytes Relative: 25 % (ref 12–46)
Lymphs Abs: 1.3 10*3/uL (ref 0.7–4.0)
MCH: 29.9 pg (ref 26.0–34.0)
MCHC: 32.9 g/dL (ref 30.0–36.0)
MCV: 90.9 fL (ref 78.0–100.0)
Monocytes Absolute: 0.9 10*3/uL (ref 0.1–1.0)
Monocytes Relative: 17 % — ABNORMAL HIGH (ref 3–12)
Neutro Abs: 3 10*3/uL (ref 1.7–7.7)
Neutrophils Relative %: 58 % (ref 43–77)
Platelets: 126 10*3/uL — ABNORMAL LOW (ref 150–400)
RBC: 4.38 MIL/uL (ref 4.22–5.81)
RDW: 14.8 % (ref 11.5–15.5)
WBC: 5.2 10*3/uL (ref 4.0–10.5)

## 2014-06-29 LAB — LACTATE DEHYDROGENASE: LDH: 147 U/L (ref 98–192)

## 2014-06-29 LAB — SEDIMENTATION RATE: Sed Rate: 20 mm/hr — ABNORMAL HIGH (ref 0–16)

## 2014-06-29 MED ORDER — ONDANSETRON HCL 4 MG PO TABS
8.0000 mg | ORAL_TABLET | Freq: Once | ORAL | Status: AC
  Administered 2014-06-29: 8 mg via ORAL
  Filled 2014-06-29: qty 2

## 2014-06-29 MED ORDER — GABAPENTIN 400 MG PO CAPS: 400.0000 mg | ORAL_CAPSULE | Freq: Four times a day (QID) | ORAL | Status: DC

## 2014-06-29 MED ORDER — BORTEZOMIB CHEMO SQ INJECTION 3.5 MG (2.5MG/ML)
1.3000 mg/m2 | Freq: Once | INTRAMUSCULAR | Status: AC
  Administered 2014-06-29: 3 mg via SUBCUTANEOUS
  Filled 2014-06-29: qty 3

## 2014-06-29 MED ORDER — ACYCLOVIR 400 MG PO TABS: 400.0000 mg | ORAL_TABLET | Freq: Two times a day (BID) | ORAL | Status: DC

## 2014-06-29 NOTE — Progress Notes (Signed)
[name], MD [place] [address]  Multiple myeloma without remission - Plan: CBC with Differential, Comprehensive metabolic panel, Lactate dehydrogenase, Beta 2 microglobuline, serum, Sedimentation rate, Multiple myeloma panel, serum, Kappa/lambda light chains, gabapentin (NEURONTIN) 400 MG capsule, acyclovir (ZOVIRAX) 400 MG tablet  Multiple myeloma - Plan: CBC with Differential, Comprehensive metabolic panel, Lactate dehydrogenase, Beta 2 microglobuline, serum, Sedimentation rate, Multiple myeloma panel, serum, Kappa/lambda light chains  CURRENT THERAPY: RVD beginning on 04/07/2014  INTERVAL HISTORY: Dean [name] 79 y.o. male returns for followup of multiple myeloma, monoclonal IgG kappa with scattered osseous lesions.     Multiple myeloma without remission   11/16/2012 Initial Diagnosis L occipital condyle destructive lesion, not biopsied secondary to location. XRT   12/01/2012 Imaging L3 superior endplate compression FX, no lytic lesions to suggest myeloma   02/06/2014 Imaging soft tissue mass along the right posterior fifth rib with some rib destruction   02/16/2014 Miscellaneous Normal CBC, Normal CMP, kappa,lamda ratio of 2.62 (abnormal), UIEP with slightly restricted band, IgG kappa, SPEP/IEP with monoclonal protein at 0.79 g/dl, normal igG, suppressed IGM   02/20/2014 Bone Marrow Biopsy 33% kappa restricted plasma cells Normal FISH, normal cytogenetics   02/21/2014 - 03/08/2014 Radiation Therapy 30Gy to R rib lesion, lesion not biopsied   03/16/2014 PET scan Scattered hypermetabolic osseous lesions, including the calvarium, ribs, left scapula, sacrum, and left femoral shaft. An expansile left vertebral body lesion at L3 extends into the epidural space of the left lateral recess,    03/17/2014 - 04/07/2014 Chemotherapy Velcade and Dexamethasone due to initial denial of Revlimid by insurance company.  Zometa monthly.   03/22/2014 Imaging MR_  L-Spine- Enhancing lesions compatible with multiple myeloma at L2, L3, and S1-2.   04/07/2014 -  Chemotherapy RVD with Revlimid at 25 mg days 1-14.  Revlimid dose reduced to 25 mg every other day x 14 days with 7 day respite beginning on day 1 of cycle 2.   04/17/2014 Adverse Reaction Revlimid-induced rash.  Treated with steroids.   I personally reviewed and went over laboratory results with the patient.  The results are noted within this dictation.  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  He notes that he had to take some rests while working.  Fatigue is a side effect of therapy.  He also notes that his appetite is up and down.  His weight is up and down as well and is remaining stable over the past 3 months.    He otherwise is doing well.  Past Medical History  Diagnosis Date  . Neuropathy   . BPH (benign prostatic hyperplasia)   . GERD (gastroesophageal reflux disease)   . H/O ETOH abuse   . Skull lesion     Left occipital condyle  . History of radiation therapy 01/05/13-02/10/13    45 gray to left occipital condyle region  . Multiple myeloma   . Radiation 02/21/14-03/08/14    right posterior chest wall area 30 gray  . Radiation 04/19/14-05/02/14    lumbar spine 25 gray    has Neuropathy; BPH (benign prostatic hyperplasia); GERD (gastroesophageal reflux disease); H/O ETOH abuse; Skull lesion; Neck pain; Fatigue; Loss of weight; Insomnia; Allergic rhinitis; Lumbar compression fracture; Dermatitis; and Multiple myeloma without remission on his problem list.     is allergic to codeine and morphine and related.  Current Outpatient Prescriptions on File Prior  to Visit  Medication Sig Dispense Refill  . aspirin EC 325 MG tablet Take 1 tablet (325 mg total) by mouth daily. 30 tablet 11  . bortezomib IV (VELCADE) 3.5 MG injection Inject 1.3 mg/m2 into the vein. Days 1, 8, 15 every 28 days    . Calcium Carb-Cholecalciferol (CALCIUM-VITAMIN D3)  600-500 MG-UNIT CAPS Take 1 tablet by mouth 2 (two) times daily. With food 60 capsule 6  . cetirizine (ZYRTEC) 10 MG tablet Take 10 mg by mouth daily.    Marland Kitchen dexamethasone (DECADRON) 4 MG tablet Take 10 tablets (23m total) on Days 1, 8, & 15 of chemo. 70 tablet 1  . diphenhydrAMINE (BENADRYL) 25 MG tablet Take 25 mg by mouth at bedtime as needed for sleep.    .Marland Kitchenlenalidomide (REVLIMID) 25 MG capsule Take 1 capsule (25 mg total) by mouth daily. 14 capsule 6  . omeprazole (PRILOSEC) 20 MG capsule Take 1 capsule (20 mg total) by mouth daily. 30 capsule 5  . ondansetron (ZOFRAN) 8 MG tablet Take 1 tablet (8 mg total) by mouth every 8 (eight) hours as needed for nausea or vomiting. 30 tablet 2  . oxyCODONE-acetaminophen (PERCOCET) 10-325 MG per tablet Take 1 tablet by mouth every 4 (four) hours as needed for pain. 60 tablet 0  . tamsulosin (FLOMAX) 0.4 MG CAPS capsule TAKE 1 CAPSULE AT BEDTIME 30 capsule 3  . Zoledronic Acid (ZOMETA IV) Inject into the vein. Every 28 days    . ALPRAZolam (XANAX) 1 MG tablet Take 1-2 tablets (1-2 mg total) by mouth at bedtime as needed for sleep. For anxiety and/or sleep (Patient not taking: Reported on 06/09/2014) 60 tablet 2  . Aspirin-Salicylamide-Caffeine (BC HEADACHE POWDER PO) Take 1 packet by mouth 2 (two) times daily as needed.    . clotrimazole (LOTRIMIN) 1 % cream Apply 1 application topically 2 (two) times daily. To ear (Patient not taking: Reported on 06/21/2014) 45 g 2  . Doxepin HCl (SILENOR) 6 MG TABS Take 1 tablet (6 mg total) by mouth at bedtime. (Patient not taking: Reported on 06/02/2014) 30 tablet 6  . zolpidem (AMBIEN) 5 MG tablet Take 1-2 tablets (5-10 mg total) by mouth at bedtime as needed for sleep. (Patient not taking: Reported on 05/19/2014) 60 tablet 1   No current facility-administered medications on file prior to visit.    Past Surgical History  Procedure Laterality Date  . Cyst excision  1959    tail bone    Denies any headaches, dizziness,  double vision, fevers, chills, night sweats, nausea, vomiting, diarrhea, constipation, chest pain, heart palpitations, shortness of breath, blood in stool, black tarry stool, urinary pain, urinary burning, urinary frequency, hematuria.   PHYSICAL EXAMINATION  ECOG PERFORMANCE STATUS: 1 - Symptomatic but completely ambulatory  Filed Vitals:   06/29/14 1031  BP: 121/79  Pulse: 71  Temp: 97.6 F (36.4 C)  Resp: 18    GENERAL:alert, no distress, well nourished, well developed, comfortable, cooperative and smiling SKIN: skin color, texture, turgor are normal, no rashes or significant lesions HEAD: Normocephalic, No masses, lesions, tenderness or abnormalities EYES: normal, PERRLA, EOMI, Conjunctiva are pink and non-injected EARS: External ears normal OROPHARYNX:lips, buccal mucosa, and tongue normal and mucous membranes are moist  NECK: supple, trachea midline LYMPH:  no palpable lymphadenopathy BREAST:not examined LUNGS: clear to auscultation  HEART: regular rate & rhythm, no murmurs, no gallops, S1 normal and S2 normal ABDOMEN:abdomen soft, non-tender, obese and normal bowel sounds BACK: Back symmetric, no curvature. EXTREMITIES:less then 2  second capillary refill, no joint deformities, effusion, or inflammation, no skin discoloration, no cyanosis  NEURO: alert & oriented x 3 with fluent speech, no focal motor/sensory deficits, gait normal    LABORATORY DATA: CBC    Component Value Date/Time   WBC 5.2 06/29/2014 1055   RBC 4.38 06/29/2014 1055   HGB 13.1 06/29/2014 1055   HCT 39.8 06/29/2014 1055   PLT 126* 06/29/2014 1055   MCV 90.9 06/29/2014 1055   MCH 29.9 06/29/2014 1055   MCHC 32.9 06/29/2014 1055   RDW 14.8 06/29/2014 1055   LYMPHSABS 1.3 06/29/2014 1055   MONOABS 0.9 06/29/2014 1055   EOSABS 0.0 06/29/2014 1055   BASOSABS 0.0 06/29/2014 1055      Chemistry      Component Value Date/Time   NA 139 06/29/2014 1055   K 4.3 06/29/2014 1055   CL 104 06/29/2014  1055   CO2 26 06/29/2014 1055   BUN 23* 06/29/2014 1055   CREATININE 0.60* 06/29/2014 1055   CREATININE 0.76 02/06/2014 1249      Component Value Date/Time   CALCIUM 8.5* 06/29/2014 1055   ALKPHOS 25* 06/29/2014 1055   AST 17 06/29/2014 1055   ALT 13* 06/29/2014 1055   BILITOT 0.6 06/29/2014 1055        ASSESSMENT AND PLAN:  Multiple myeloma without remission Multiple myeloma, monoclonal IgG kappa with scattered osseous lesions.  Currently on RVD therapy.   S/P 4 cycles of treatment.  Continue with current treatment plan with goal of re-evaluation following cycle #6.  Continue with Zometa as ordered, next infusion is scheduled for 6/3.   He is due for Velcade tomorrow, but he is agreeable to treatment today to save him a trip.  He wants to go back to Friday treatment after today.  I have altered his treatment plan to reflect this minor change today.  Currently up to daily dosing (14 days on and 7 days off) after experiencing a rash secondary to Revlimid in past requiring a dose reduction to every other day.  Return in 2 weeks for follow-up.  I have refilled Acyclovir and Neurontin.    I have filled out a handicap parking placard for him as well.   THERAPY PLAN:  Continue with current treatment plan.  All questions were answered. The patient knows to call the clinic with any problems, questions or concerns. We can certainly see the patient much sooner if necessary.  Patient and plan discussed with Dr. Ancil Linsey and she is in agreement with the aforementioned.   This note is electronically signed by: Doy Mince 06/29/2014 12:51 PM

## 2014-06-29 NOTE — Assessment & Plan Note (Addendum)
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  Currently on RVD therapy.   S/P 4 cycles of treatment.  Continue with current treatment plan with goal of re-evaluation following cycle #6.  Continue with Zometa as ordered, next infusion is scheduled for 6/3.   He is due for Velcade tomorrow, but he is agreeable to treatment today to save him a trip.  He wants to go back to Friday treatment after today.  I have altered his treatment plan to reflect this minor change today.  Currently up to daily dosing (14 days on and 7 days off) after experiencing a rash secondary to Revlimid in past requiring a dose reduction to every other day.  Return in 2 weeks for follow-up.  I have refilled Acyclovir and Neurontin.    I have filled out a handicap parking placard for him as well.

## 2014-06-29 NOTE — Progress Notes (Signed)
LABS DRAWN

## 2014-06-29 NOTE — Patient Instructions (Addendum)
[place] at [facility] Discharge Instructions  RECOMMENDATIONS MADE BY THE CONSULTANT AND ANY TEST RESULTS WILL BE SENT TO YOUR REFERRING PHYSICIAN.  Exam and discussion by Robynn Pane, PA-C Will treat today if blood counts are good. Then will resume Friday schedule. Call any concerns.  Follow-up with chemo on 6/3 and 6/10 and office visit on 6/10   Thank you for choosing [place] at [facility] to provide your oncology and hematology care.  To afford each patient quality time with our provider, please arrive at least 15 minutes before your scheduled appointment time.    You need to re-schedule your appointment should you arrive 10 or more minutes late.  We strive to give you quality time with our providers, and arriving late affects you and other patients whose appointments are after yours.  Also, if you no show three or more times for appointments you may be dismissed from the clinic at the providers discretion.     Again, thank you for choosing [facility].  Our hope is that these requests will decrease the amount of time that you wait before being seen by our physicians.       _____________________________________________________________  Should you have questions after your visit to [facility], please contact our office at [phone] between the hours of 8:30 a.m. and 4:30 p.m.  Voicemails left after 4:30 p.m. will not be returned until the following business day.  For prescription refill requests, have your pharmacy contact our office.

## 2014-06-30 LAB — KAPPA/LAMBDA LIGHT CHAINS
Kappa free light chain: 24.31 mg/L — ABNORMAL HIGH (ref 3.30–19.40)
Kappa, lambda light chain ratio: 2.28 — ABNORMAL HIGH (ref 0.26–1.65)
Lambda free light chains: 10.66 mg/L (ref 5.71–26.30)

## 2014-06-30 LAB — BETA 2 MICROGLOBULIN, SERUM: Beta-2 Microglobulin: 1.8 mg/L (ref 0.6–2.4)

## 2014-07-04 LAB — MULTIPLE MYELOMA PANEL, SERUM
Albumin SerPl Elph-Mcnc: 3.2 g/dL (ref 3.2–5.6)
Albumin/Glob SerPl: 1.4 (ref 0.7–2.0)
Alpha 1: 0.3 g/dL (ref 0.1–0.4)
Alpha2 Glob SerPl Elph-Mcnc: 0.9 g/dL (ref 0.4–1.2)
B-Globulin SerPl Elph-Mcnc: 0.9 g/dL (ref 0.6–1.3)
Gamma Glob SerPl Elph-Mcnc: 0.4 g/dL — ABNORMAL LOW (ref 0.5–1.6)
Globulin, Total: 2.4 g/dL (ref 2.0–4.5)
IgA: 68 mg/dL (ref 61–437)
IgG (Immunoglobin G), Serum: 385 mg/dL — ABNORMAL LOW (ref 700–1600)
IgM, Serum: 21 mg/dL (ref 15–143)
M Protein SerPl Elph-Mcnc: 0.1 g/dL — ABNORMAL HIGH
Total Protein ELP: 5.6 g/dL — ABNORMAL LOW (ref 6.0–8.5)

## 2014-07-05 ENCOUNTER — Other Ambulatory Visit (HOSPITAL_COMMUNITY): Payer: Self-pay | Admitting: Oncology

## 2014-07-05 ENCOUNTER — Other Ambulatory Visit (HOSPITAL_COMMUNITY): Payer: Self-pay

## 2014-07-05 DIAGNOSIS — C9000 Multiple myeloma not having achieved remission: Secondary | ICD-10-CM

## 2014-07-06 ENCOUNTER — Other Ambulatory Visit (HOSPITAL_COMMUNITY): Payer: Self-pay

## 2014-07-07 ENCOUNTER — Encounter (HOSPITAL_BASED_OUTPATIENT_CLINIC_OR_DEPARTMENT_OTHER): Payer: Medicare Other

## 2014-07-07 ENCOUNTER — Encounter (HOSPITAL_COMMUNITY): Payer: Self-pay

## 2014-07-07 ENCOUNTER — Encounter (HOSPITAL_COMMUNITY): Payer: Medicare Other | Attending: Hematology and Oncology

## 2014-07-07 ENCOUNTER — Encounter (HOSPITAL_COMMUNITY): Payer: Medicare Other

## 2014-07-07 VITALS — BP 121/61 | HR 74 | Temp 98.3°F | Resp 18

## 2014-07-07 DIAGNOSIS — Z923 Personal history of irradiation: Secondary | ICD-10-CM | POA: Diagnosis not present

## 2014-07-07 DIAGNOSIS — R634 Abnormal weight loss: Secondary | ICD-10-CM | POA: Insufficient documentation

## 2014-07-07 DIAGNOSIS — M542 Cervicalgia: Secondary | ICD-10-CM | POA: Diagnosis not present

## 2014-07-07 DIAGNOSIS — Z5112 Encounter for antineoplastic immunotherapy: Secondary | ICD-10-CM | POA: Diagnosis not present

## 2014-07-07 DIAGNOSIS — C9000 Multiple myeloma not having achieved remission: Secondary | ICD-10-CM | POA: Diagnosis present

## 2014-07-07 DIAGNOSIS — M899 Disorder of bone, unspecified: Secondary | ICD-10-CM | POA: Insufficient documentation

## 2014-07-07 DIAGNOSIS — D472 Monoclonal gammopathy: Secondary | ICD-10-CM | POA: Insufficient documentation

## 2014-07-07 DIAGNOSIS — R630 Anorexia: Secondary | ICD-10-CM | POA: Insufficient documentation

## 2014-07-07 DIAGNOSIS — K219 Gastro-esophageal reflux disease without esophagitis: Secondary | ICD-10-CM | POA: Diagnosis not present

## 2014-07-07 DIAGNOSIS — N400 Enlarged prostate without lower urinary tract symptoms: Secondary | ICD-10-CM | POA: Diagnosis not present

## 2014-07-07 LAB — CBC WITH DIFFERENTIAL/PLATELET
Basophils Absolute: 0 10*3/uL (ref 0.0–0.1)
Basophils Relative: 0 % (ref 0–1)
Eosinophils Absolute: 0.1 10*3/uL (ref 0.0–0.7)
Eosinophils Relative: 2 % (ref 0–5)
HCT: 40.4 % (ref 39.0–52.0)
Hemoglobin: 13 g/dL (ref 13.0–17.0)
Lymphocytes Relative: 20 % (ref 12–46)
Lymphs Abs: 0.9 10*3/uL (ref 0.7–4.0)
MCH: 29.9 pg (ref 26.0–34.0)
MCHC: 32.2 g/dL (ref 30.0–36.0)
MCV: 92.9 fL (ref 78.0–100.0)
Monocytes Absolute: 0.5 10*3/uL (ref 0.1–1.0)
Monocytes Relative: 10 % (ref 3–12)
Neutro Abs: 3 10*3/uL (ref 1.7–7.7)
Neutrophils Relative %: 68 % (ref 43–77)
Platelets: 136 10*3/uL — ABNORMAL LOW (ref 150–400)
RBC: 4.35 MIL/uL (ref 4.22–5.81)
RDW: 15.2 % (ref 11.5–15.5)
WBC: 4.5 10*3/uL (ref 4.0–10.5)

## 2014-07-07 LAB — COMPREHENSIVE METABOLIC PANEL
ALT: 13 U/L — ABNORMAL LOW (ref 17–63)
AST: 18 U/L (ref 15–41)
Albumin: 3.3 g/dL — ABNORMAL LOW (ref 3.5–5.0)
Alkaline Phosphatase: 25 U/L — ABNORMAL LOW (ref 38–126)
Anion gap: 8 (ref 5–15)
BUN: 14 mg/dL (ref 6–20)
CO2: 25 mmol/L (ref 22–32)
Calcium: 8.8 mg/dL — ABNORMAL LOW (ref 8.9–10.3)
Chloride: 110 mmol/L (ref 101–111)
Creatinine, Ser: 0.74 mg/dL (ref 0.61–1.24)
GFR calc Af Amer: >60 mL/min (ref >60)
GFR calc non Af Amer: >60 mL/min (ref >60)
Glucose, Bld: 102 mg/dL — ABNORMAL HIGH (ref 65–99)
Potassium: 4.4 mmol/L (ref 3.5–5.1)
Sodium: 143 mmol/L (ref 135–145)
Total Bilirubin: 0.5 mg/dL (ref 0.3–1.2)
Total Protein: 5.7 g/dL — ABNORMAL LOW (ref 6.5–8.1)

## 2014-07-07 MED ORDER — BORTEZOMIB CHEMO SQ INJECTION 3.5 MG (2.5MG/ML)
1.3000 mg/m2 | Freq: Once | INTRAMUSCULAR | Status: AC
  Administered 2014-07-07: 3 mg via SUBCUTANEOUS
  Filled 2014-07-07: qty 3

## 2014-07-07 MED ORDER — SODIUM CHLORIDE 0.9 % IJ SOLN: 10.0000 mL | INTRAMUSCULAR | Status: DC | PRN

## 2014-07-07 MED ORDER — SODIUM CHLORIDE 0.9 % IV SOLN
4.0000 mg | Freq: Once | INTRAVENOUS | Status: AC
  Administered 2014-07-07: 4 mg via INTRAVENOUS
  Filled 2014-07-07: qty 5

## 2014-07-07 MED ORDER — SODIUM CHLORIDE 0.9 % IV SOLN
Freq: Once | INTRAVENOUS | Status: AC
  Administered 2014-07-07: 11:00:00 via INTRAVENOUS

## 2014-07-07 MED ORDER — ONDANSETRON HCL 4 MG PO TABS
8.0000 mg | ORAL_TABLET | Freq: Once | ORAL | Status: AC
  Administered 2014-07-07: 8 mg via ORAL
  Filled 2014-07-07: qty 2

## 2014-07-07 NOTE — Progress Notes (Signed)
..  Dean [name] arrived today for velcade and zometa. He reports a rash which started on Sunday. He stopped revlimid at this time. The rash was an itching rash that "looked like the Measles"  And was on his forearms, chest and thighs. He treated with benadryl and rash has resolved. Discussed with T. Kefalas PA and patient instructed to not take revlimid until further instructions when he sees PA on 07/14/2014.verbalized understanding.

## 2014-07-07 NOTE — Progress Notes (Signed)
Labs drawn

## 2014-07-07 NOTE — Patient Instructions (Signed)
..  [facility] Discharge Instructions for Patients Receiving Chemotherapy  Today you received the following chemotherapy agents velcade and zometa  Due to the rash that you developed, do not take revlimid until you see [name] next week on June 10th   If you develop nausea and vomiting, or diarrhea that is not controlled by your medication, call the clinic.  The clinic phone number is [phone]. Office hours are Monday-Friday 8:30am-5:00pm.  BELOW ARE SYMPTOMS THAT SHOULD BE REPORTED IMMEDIATELY:  *FEVER GREATER THAN 101.0 F  *CHILLS WITH OR WITHOUT FEVER  NAUSEA AND VOMITING THAT IS NOT CONTROLLED WITH YOUR NAUSEA MEDICATION  *UNUSUAL SHORTNESS OF BREATH  *UNUSUAL BRUISING OR BLEEDING  TENDERNESS IN MOUTH AND THROAT WITH OR WITHOUT PRESENCE OF ULCERS  *URINARY PROBLEMS  *BOWEL PROBLEMS  UNUSUAL RASH Items with * indicate a potential emergency and should be followed up as soon as possible. If you have an emergency after office hours please contact your primary care physician or go to the nearest emergency department.  Please call the clinic during office hours if you have any questions or concerns.   You may also contact the Patient Navigator at [phone] should you have any questions or need assistance in obtaining follow up care. _____________________________________________________________________ Have you asked about our STAR program?    STAR stands for Survivorship Training and Rehabilitation, and this is a nationally recognized cancer care program that focuses on survivorship and rehabilitation.  Cancer and cancer treatments may cause problems, such as, pain, making you feel tired and keeping you from doing the things that you need or want to do. Cancer rehabilitation can help. Our goal is to reduce these troubling effects and help you have the best quality of life possible.  You may receive a survey from a nurse that asks questions about your current  state of health.  Based on the survey results, all eligible patients will be referred to the [facility] program for an evaluation so we can better serve you! A frequently asked questions sheet is available upon request.

## 2014-07-07 NOTE — Progress Notes (Signed)
Tolerated infusion well. 

## 2014-07-07 NOTE — Progress Notes (Signed)
tolerated

## 2014-07-12 ENCOUNTER — Other Ambulatory Visit (HOSPITAL_COMMUNITY): Payer: Self-pay | Admitting: Oncology

## 2014-07-12 DIAGNOSIS — C9000 Multiple myeloma not having achieved remission: Secondary | ICD-10-CM

## 2014-07-12 MED ORDER — LENALIDOMIDE 25 MG PO CAPS: ORAL_CAPSULE | ORAL | Status: DC

## 2014-07-14 ENCOUNTER — Encounter (HOSPITAL_COMMUNITY): Payer: Medicare Other

## 2014-07-14 ENCOUNTER — Other Ambulatory Visit (HOSPITAL_COMMUNITY): Payer: Self-pay | Admitting: Oncology

## 2014-07-14 ENCOUNTER — Encounter (HOSPITAL_BASED_OUTPATIENT_CLINIC_OR_DEPARTMENT_OTHER): Payer: Medicare Other

## 2014-07-14 ENCOUNTER — Other Ambulatory Visit (HOSPITAL_COMMUNITY): Payer: Medicare Other

## 2014-07-14 ENCOUNTER — Encounter (HOSPITAL_BASED_OUTPATIENT_CLINIC_OR_DEPARTMENT_OTHER): Payer: Medicare Other | Admitting: Oncology

## 2014-07-14 ENCOUNTER — Encounter (HOSPITAL_COMMUNITY): Payer: Self-pay

## 2014-07-14 VITALS — BP 120/57 | HR 76 | Temp 98.3°F | Resp 18 | Wt 233.4 lb

## 2014-07-14 DIAGNOSIS — C9000 Multiple myeloma not having achieved remission: Secondary | ICD-10-CM

## 2014-07-14 DIAGNOSIS — K219 Gastro-esophageal reflux disease without esophagitis: Secondary | ICD-10-CM | POA: Diagnosis not present

## 2014-07-14 DIAGNOSIS — Z5112 Encounter for antineoplastic immunotherapy: Secondary | ICD-10-CM

## 2014-07-14 DIAGNOSIS — R634 Abnormal weight loss: Secondary | ICD-10-CM | POA: Diagnosis not present

## 2014-07-14 DIAGNOSIS — R630 Anorexia: Secondary | ICD-10-CM | POA: Diagnosis not present

## 2014-07-14 DIAGNOSIS — N400 Enlarged prostate without lower urinary tract symptoms: Secondary | ICD-10-CM | POA: Diagnosis not present

## 2014-07-14 DIAGNOSIS — M899 Disorder of bone, unspecified: Secondary | ICD-10-CM | POA: Diagnosis not present

## 2014-07-14 DIAGNOSIS — D472 Monoclonal gammopathy: Secondary | ICD-10-CM | POA: Diagnosis not present

## 2014-07-14 LAB — CBC WITH DIFFERENTIAL/PLATELET
Basophils Absolute: 0 10*3/uL (ref 0.0–0.1)
Basophils Relative: 0 % (ref 0–1)
Eosinophils Absolute: 0.1 10*3/uL (ref 0.0–0.7)
Eosinophils Relative: 1 % (ref 0–5)
HCT: 38.4 % — ABNORMAL LOW (ref 39.0–52.0)
Hemoglobin: 12.6 g/dL — ABNORMAL LOW (ref 13.0–17.0)
Lymphocytes Relative: 17 % (ref 12–46)
Lymphs Abs: 0.9 10*3/uL (ref 0.7–4.0)
MCH: 29.9 pg (ref 26.0–34.0)
MCHC: 32.8 g/dL (ref 30.0–36.0)
MCV: 91.2 fL (ref 78.0–100.0)
Monocytes Absolute: 1.6 10*3/uL — ABNORMAL HIGH (ref 0.1–1.0)
Monocytes Relative: 28 % — ABNORMAL HIGH (ref 3–12)
Neutro Abs: 3 10*3/uL (ref 1.7–7.7)
Neutrophils Relative %: 54 % (ref 43–77)
Platelets: 147 10*3/uL — ABNORMAL LOW (ref 150–400)
RBC: 4.21 MIL/uL — ABNORMAL LOW (ref 4.22–5.81)
RDW: 14.6 % (ref 11.5–15.5)
WBC: 5.6 10*3/uL (ref 4.0–10.5)

## 2014-07-14 LAB — COMPREHENSIVE METABOLIC PANEL
ALT: 11 U/L — ABNORMAL LOW (ref 17–63)
AST: 15 U/L (ref 15–41)
Albumin: 3.2 g/dL — ABNORMAL LOW (ref 3.5–5.0)
Alkaline Phosphatase: 20 U/L — ABNORMAL LOW (ref 38–126)
Anion gap: 10 (ref 5–15)
BUN: 20 mg/dL (ref 6–20)
CO2: 23 mmol/L (ref 22–32)
Calcium: 8.5 mg/dL — ABNORMAL LOW (ref 8.9–10.3)
Chloride: 106 mmol/L (ref 101–111)
Creatinine, Ser: 0.7 mg/dL (ref 0.61–1.24)
GFR calc Af Amer: >60 mL/min (ref >60)
GFR calc non Af Amer: >60 mL/min (ref >60)
Glucose, Bld: 88 mg/dL (ref 65–99)
Potassium: 3.8 mmol/L (ref 3.5–5.1)
Sodium: 139 mmol/L (ref 135–145)
Total Bilirubin: 0.7 mg/dL (ref 0.3–1.2)
Total Protein: 5.9 g/dL — ABNORMAL LOW (ref 6.5–8.1)

## 2014-07-14 MED ORDER — ONDANSETRON HCL 4 MG PO TABS
8.0000 mg | ORAL_TABLET | Freq: Once | ORAL | Status: AC
  Administered 2014-07-14: 8 mg via ORAL
  Filled 2014-07-14: qty 2

## 2014-07-14 MED ORDER — BORTEZOMIB CHEMO SQ INJECTION 3.5 MG (2.5MG/ML)
1.3000 mg/m2 | Freq: Once | INTRAMUSCULAR | Status: AC
  Administered 2014-07-14: 3 mg via SUBCUTANEOUS
  Filled 2014-07-14: qty 3

## 2014-07-14 NOTE — Progress Notes (Signed)
LABS DRAWN

## 2014-07-14 NOTE — Assessment & Plan Note (Addendum)
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  Currently on RVD therapy.   S/P 4 cycles of treatment.  Here for Day 15 cycle 4.  Continue with current treatment plan with goal of re-evaluation following cycle #6.    Will restart Revlimid next week with Day 1 of cycle 6 and take it every other day 14 days on and 7 days off.  Suspect highs and lows of energy are secondary to Dexamethasone use.  Return in 2-3 weeks for follow-up.

## 2014-07-14 NOTE — Progress Notes (Signed)
Dean Fraction, MD [place] [address]  Multiple myeloma without remission  CURRENT THERAPY: RVD- Revlimid on hold due to rash.  Will restart today.  INTERVAL HISTORY: Dean [name] 79 y.o. male returns for followup of multiple myeloma, monoclonal IgG kappa with scattered osseous lesions.     Multiple myeloma without remission   11/16/2012 Initial Diagnosis L occipital condyle destructive lesion, not biopsied secondary to location. XRT   12/01/2012 Imaging L3 superior endplate compression FX, no lytic lesions to suggest myeloma   02/06/2014 Imaging soft tissue mass along the right posterior fifth rib with some rib destruction   02/16/2014 Miscellaneous Normal CBC, Normal CMP, kappa,lamda ratio of 2.62 (abnormal), UIEP with slightly restricted band, IgG kappa, SPEP/IEP with monoclonal protein at 0.79 g/dl, normal igG, suppressed IGM   02/20/2014 Bone Marrow Biopsy 33% kappa restricted plasma cells Normal FISH, normal cytogenetics   02/21/2014 - 03/08/2014 Radiation Therapy 30Gy to R rib lesion, lesion not biopsied   03/16/2014 PET scan Scattered hypermetabolic osseous lesions, including the calvarium, ribs, left scapula, sacrum, and left femoral shaft. An expansile left vertebral body lesion at L3 extends into the epidural space of the left lateral recess,    03/17/2014 - 04/07/2014 Chemotherapy Velcade and Dexamethasone due to initial denial of Revlimid by insurance company.  Zometa monthly.   03/22/2014 Imaging MR_ L-Spine- Enhancing lesions compatible with multiple myeloma at L2, L3, and S1-2.   04/07/2014 -  Chemotherapy RVD with Revlimid at 25 mg days 1-14.  Revlimid dose reduced to 25 mg every other day x 14 days with 7 day respite beginning on day 1 of cycle 2.   04/17/2014 Adverse Reaction Revlimid-induced rash.  Treated with steroids.    I personally reviewed and went over laboratory results with the patient.  The results are noted within this  dictation.  He reports  That he feels great on Friday, Saturday, and Sunday, but beginning Sunday evening, he starts to feel tired and fatigued.  It lasts until about Wednesday.  He notes that it occurs typically every treatment.  He takes his Dexamethasone on Friday.  He reports a rash with revlimid at the daily dosing and therefore it was held.  He will restart next week with every other day dosing as he tolerated this better.  Past Medical History  Diagnosis Date  . Neuropathy   . BPH (benign prostatic hyperplasia)   . GERD (gastroesophageal reflux disease)   . H/O ETOH abuse   . Skull lesion     Left occipital condyle  . History of radiation therapy 01/05/13-02/10/13    45 gray to left occipital condyle region  . Multiple myeloma   . Radiation 02/21/14-03/08/14    right posterior chest wall area 30 gray  . Radiation 04/19/14-05/02/14    lumbar spine 25 gray    has Neuropathy; BPH (benign prostatic hyperplasia); GERD (gastroesophageal reflux disease); H/O ETOH abuse; Skull lesion; Neck pain; Fatigue; Loss of weight; Insomnia; Allergic rhinitis; Lumbar compression fracture; Dermatitis; and Multiple myeloma without remission on his problem list.     is allergic to codeine and morphine and related.  Current Outpatient Prescriptions on File Prior to Visit  Medication Sig Dispense Refill  . acyclovir (ZOVIRAX) 400 MG tablet Take 1 tablet (400 mg total) by mouth 2 (two) times daily. 60 tablet 5  . ALPRAZolam (XANAX) 1 MG tablet Take 1-2 tablets (1-2 mg total) by mouth at bedtime as needed  for sleep. For anxiety and/or sleep 60 tablet 2  . aspirin EC 325 MG tablet Take 1 tablet (325 mg total) by mouth daily. 30 tablet 11  . Aspirin-Salicylamide-Caffeine (BC HEADACHE POWDER PO) Take 1 packet by mouth 2 (two) times daily as needed.    . bortezomib IV (VELCADE) 3.5 MG injection Inject 1.3 mg/m2 into the vein. Days 1, 8, 15 every 28 days    . Calcium Carb-Cholecalciferol (CALCIUM-VITAMIN D3)  600-500 MG-UNIT CAPS Take 1 tablet by mouth 2 (two) times daily. With food 60 capsule 6  . cetirizine (ZYRTEC) 10 MG tablet Take 10 mg by mouth daily.    . clotrimazole (LOTRIMIN) 1 % cream Apply 1 application topically 2 (two) times daily. To ear (Patient not taking: Reported on 06/21/2014) 45 g 2  . dexamethasone (DECADRON) 4 MG tablet Take 10 tablets ($RemoveBef'40mg'sbwZrIAJAH$  total) on Days 1, 8, & 15 of chemo. 70 tablet 1  . diphenhydrAMINE (BENADRYL) 25 MG tablet Take 25 mg by mouth at bedtime as needed for sleep.    . Doxepin HCl (SILENOR) 6 MG TABS Take 1 tablet (6 mg total) by mouth at bedtime. 30 tablet 6  . gabapentin (NEURONTIN) 400 MG capsule Take 1 capsule (400 mg total) by mouth 4 (four) times daily. May take up to five times daily depending on pain in feet 180 capsule 5  . lenalidomide (REVLIMID) 25 MG capsule 25 mg PO every other day, days 1- 14 every 21 days (Patient not taking: Reported on 07/14/2014) 7 capsule 1  . omeprazole (PRILOSEC) 20 MG capsule Take 1 capsule (20 mg total) by mouth daily. 30 capsule 5  . ondansetron (ZOFRAN) 8 MG tablet Take 1 tablet (8 mg total) by mouth every 8 (eight) hours as needed for nausea or vomiting. (Patient not taking: Reported on 07/14/2014) 30 tablet 2  . oxyCODONE-acetaminophen (PERCOCET) 10-325 MG per tablet Take 1 tablet by mouth every 4 (four) hours as needed for pain. (Patient not taking: Reported on 07/14/2014) 60 tablet 0  . tamsulosin (FLOMAX) 0.4 MG CAPS capsule TAKE 1 CAPSULE AT BEDTIME 30 capsule 3  . Zoledronic Acid (ZOMETA IV) Inject into the vein. Every 28 days    . zolpidem (AMBIEN) 5 MG tablet Take 1-2 tablets (5-10 mg total) by mouth at bedtime as needed for sleep. (Patient not taking: Reported on 05/19/2014) 60 tablet 1   No current facility-administered medications on file prior to visit.    Past Surgical History  Procedure Laterality Date  . Cyst excision  1959    tail bone    Denies any headaches, dizziness, double vision, fevers, chills,  night sweats, nausea, vomiting, diarrhea, constipation, chest pain, heart palpitations, shortness of breath, blood in stool, black tarry stool, urinary pain, urinary burning, urinary frequency, hematuria.   PHYSICAL EXAMINATION  ECOG PERFORMANCE STATUS: 1 - Symptomatic but completely ambulatory  There were no vitals filed for this visit.  GENERAL:alert, no distress, well nourished, well developed, comfortable, cooperative and smiling SKIN: skin color, texture, turgor are normal, no rashes or significant lesions HEAD: Normocephalic, No masses, lesions, tenderness or abnormalities EYES: normal, PERRLA, EOMI, Conjunctiva are pink and non-injected EARS: External ears normal OROPHARYNX:lips, buccal mucosa, and tongue normal and mucous membranes are moist  NECK: supple, no adenopathy, thyroid normal size, non-tender, without nodularity, trachea midline LYMPH:  no palpable lymphadenopathy BREAST:not examined LUNGS: clear to auscultation  HEART: regular rate & rhythm, no murmurs and no gallops ABDOMEN:abdomen soft and normal bowel sounds BACK: Back symmetric, no  curvature. EXTREMITIES:less then 2 second capillary refill, no joint deformities, effusion, or inflammation, no skin discoloration  NEURO: alert & oriented x 3 with fluent speech, no focal motor/sensory deficits, gait normal   LABORATORY DATA: CBC    Component Value Date/Time   WBC 5.6 07/14/2014 1017   RBC 4.21* 07/14/2014 1017   HGB 12.6* 07/14/2014 1017   HCT 38.4* 07/14/2014 1017   PLT 147* 07/14/2014 1017   MCV 91.2 07/14/2014 1017   MCH 29.9 07/14/2014 1017   MCHC 32.8 07/14/2014 1017   RDW 14.6 07/14/2014 1017   LYMPHSABS 0.9 07/14/2014 1017   MONOABS 1.6* 07/14/2014 1017   EOSABS 0.1 07/14/2014 1017   BASOSABS 0.0 07/14/2014 1017      Chemistry      Component Value Date/Time   NA 139 07/14/2014 1017   K 3.8 07/14/2014 1017   CL 106 07/14/2014 1017   CO2 23 07/14/2014 1017   BUN 20 07/14/2014 1017    CREATININE 0.70 07/14/2014 1017   CREATININE 0.76 02/06/2014 1249      Component Value Date/Time   CALCIUM 8.5* 07/14/2014 1017   ALKPHOS 20* 07/14/2014 1017   AST 15 07/14/2014 1017   ALT 11* 07/14/2014 1017   BILITOT 0.7 07/14/2014 1017        ASSESSMENT AND PLAN:  Multiple myeloma without remission Multiple myeloma, monoclonal IgG kappa with scattered osseous lesions.  Currently on RVD therapy.   S/P 4 cycles of treatment.  Here for Day 15 cycle 4.  Continue with current treatment plan with goal of re-evaluation following cycle #6.    Will restart Revlimid next week with Day 1 of cycle 6 and take it every other day 14 days on and 7 days off.  Suspect highs and lows of energy are secondary to Dexamethasone use.  Return in 2-3 weeks for follow-up.    THERAPY PLAN:  Continue therapy as planned with re-evaluation after cycle 6 of therapy.  All questions were answered. The patient knows to call the clinic with any problems, questions or concerns. We can certainly see the patient much sooner if necessary.  Patient and plan discussed with Dr. Ancil Linsey and she is in agreement with the aforementioned.   Patient and plan discussed with Dr. Ancil Linsey and she is in agreement with the aforementioned.   This note is electronically signed by: Doy Mince 07/14/2014 11:42 AM

## 2014-07-14 NOTE — Patient Instructions (Addendum)
[place] at [facility]  Discharge Instructions:  Restart Revlimid every other day for 2 weeks with a 1 week break next week on 07/21/2014.  _______________________________________________________________  Thank you for choosing [place] at [facility] to provide your oncology and hematology care.  To afford each patient quality time with our providers, please arrive at least 15 minutes before your scheduled appointment.  You need to re-schedule your appointment if you arrive 10 or more minutes late.  We strive to give you quality time with our providers, and arriving late affects you and other patients whose appointments are after yours.  Also, if you no show three or more times for appointments you may be dismissed from the clinic.  Again, thank you for choosing [place] at [facility] hope is that these requests will allow you access to exceptional care and in a timely manner. _______________________________________________________________  If you have questions after your visit, please contact our office at [phone] between the hours of 8:30 a.m. and 5:00 p.m. Voicemails left after 4:30 p.m. will not be returned until the following business day. _______________________________________________________________  For prescription refill requests, have your pharmacy contact our office. _______________________________________________________________  Recommendations made by the consultant and any test results will be sent to your referring physician. _______________________________________________________________

## 2014-07-14 NOTE — Progress Notes (Signed)
..  Lonald Troiani presents today for injection per the provider's orders.  velcade administration without incident; see MAR for injection details.  Patient tolerated procedure well and without incident.  No questions or complaints noted at this time.

## 2014-07-20 ENCOUNTER — Other Ambulatory Visit (HOSPITAL_COMMUNITY): Payer: Self-pay

## 2014-07-21 ENCOUNTER — Encounter (HOSPITAL_BASED_OUTPATIENT_CLINIC_OR_DEPARTMENT_OTHER): Payer: Medicare Other

## 2014-07-21 ENCOUNTER — Encounter (HOSPITAL_COMMUNITY): Payer: Self-pay

## 2014-07-21 VITALS — BP 145/81 | HR 87 | Temp 98.2°F | Resp 18 | Wt 236.2 lb

## 2014-07-21 DIAGNOSIS — Z5112 Encounter for antineoplastic immunotherapy: Secondary | ICD-10-CM | POA: Diagnosis present

## 2014-07-21 DIAGNOSIS — C9000 Multiple myeloma not having achieved remission: Secondary | ICD-10-CM

## 2014-07-21 DIAGNOSIS — K219 Gastro-esophageal reflux disease without esophagitis: Secondary | ICD-10-CM | POA: Diagnosis not present

## 2014-07-21 DIAGNOSIS — M899 Disorder of bone, unspecified: Secondary | ICD-10-CM | POA: Diagnosis not present

## 2014-07-21 DIAGNOSIS — D472 Monoclonal gammopathy: Secondary | ICD-10-CM | POA: Diagnosis not present

## 2014-07-21 DIAGNOSIS — R630 Anorexia: Secondary | ICD-10-CM | POA: Diagnosis not present

## 2014-07-21 DIAGNOSIS — R634 Abnormal weight loss: Secondary | ICD-10-CM | POA: Diagnosis not present

## 2014-07-21 DIAGNOSIS — N400 Enlarged prostate without lower urinary tract symptoms: Secondary | ICD-10-CM | POA: Diagnosis not present

## 2014-07-21 LAB — COMPREHENSIVE METABOLIC PANEL
ALT: 11 U/L — ABNORMAL LOW (ref 17–63)
AST: 21 U/L (ref 15–41)
Albumin: 3.3 g/dL — ABNORMAL LOW (ref 3.5–5.0)
Alkaline Phosphatase: 26 U/L — ABNORMAL LOW (ref 38–126)
Anion gap: 9 (ref 5–15)
BUN: 14 mg/dL (ref 6–20)
CO2: 27 mmol/L (ref 22–32)
Calcium: 9 mg/dL (ref 8.9–10.3)
Chloride: 107 mmol/L (ref 101–111)
Creatinine, Ser: 0.75 mg/dL (ref 0.61–1.24)
GFR calc Af Amer: >60 mL/min (ref >60)
GFR calc non Af Amer: >60 mL/min (ref >60)
Glucose, Bld: 93 mg/dL (ref 65–99)
Potassium: 4.6 mmol/L (ref 3.5–5.1)
Sodium: 143 mmol/L (ref 135–145)
Total Bilirubin: 0.5 mg/dL (ref 0.3–1.2)
Total Protein: 6.1 g/dL — ABNORMAL LOW (ref 6.5–8.1)

## 2014-07-21 LAB — CBC WITH DIFFERENTIAL/PLATELET
Basophils Absolute: 0 10*3/uL (ref 0.0–0.1)
Basophils Relative: 0 % (ref 0–1)
Eosinophils Absolute: 0.1 10*3/uL (ref 0.0–0.7)
Eosinophils Relative: 1 % (ref 0–5)
HCT: 41.5 % (ref 39.0–52.0)
Hemoglobin: 13.5 g/dL (ref 13.0–17.0)
Lymphocytes Relative: 14 % (ref 12–46)
Lymphs Abs: 0.8 10*3/uL (ref 0.7–4.0)
MCH: 30.1 pg (ref 26.0–34.0)
MCHC: 32.5 g/dL (ref 30.0–36.0)
MCV: 92.4 fL (ref 78.0–100.0)
Monocytes Absolute: 0.7 10*3/uL (ref 0.1–1.0)
Monocytes Relative: 11 % (ref 3–12)
Neutro Abs: 4.4 10*3/uL (ref 1.7–7.7)
Neutrophils Relative %: 74 % (ref 43–77)
Platelets: 174 10*3/uL (ref 150–400)
RBC: 4.49 MIL/uL (ref 4.22–5.81)
RDW: 14.4 % (ref 11.5–15.5)
WBC: 6 10*3/uL (ref 4.0–10.5)

## 2014-07-21 MED ORDER — ONDANSETRON HCL 4 MG PO TABS
ORAL_TABLET | ORAL | Status: AC
  Filled 2014-07-21: qty 2

## 2014-07-21 MED ORDER — ONDANSETRON HCL 4 MG PO TABS
8.0000 mg | ORAL_TABLET | Freq: Once | ORAL | Status: AC
  Administered 2014-07-21: 8 mg via ORAL

## 2014-07-21 MED ORDER — BORTEZOMIB CHEMO SQ INJECTION 3.5 MG (2.5MG/ML)
1.3000 mg/m2 | Freq: Once | INTRAMUSCULAR | Status: AC
  Administered 2014-07-21: 3 mg via SUBCUTANEOUS
  Filled 2014-07-21: qty 3

## 2014-07-21 NOTE — Progress Notes (Signed)
Lab draw

## 2014-07-21 NOTE — Progress Notes (Signed)
..  Dean [name] presents today for injection per the provider's orders.  velcade administration without incident; see MAR for injection details.  Patient tolerated procedure well and without incident.  No questions or complaints noted at this time.

## 2014-07-28 ENCOUNTER — Encounter (HOSPITAL_BASED_OUTPATIENT_CLINIC_OR_DEPARTMENT_OTHER): Payer: Medicare Other

## 2014-07-28 ENCOUNTER — Encounter (HOSPITAL_BASED_OUTPATIENT_CLINIC_OR_DEPARTMENT_OTHER): Payer: Medicare Other | Admitting: Oncology

## 2014-07-28 VITALS — BP 116/63 | HR 78 | Temp 98.3°F | Resp 20 | Wt 230.2 lb

## 2014-07-28 DIAGNOSIS — Z5112 Encounter for antineoplastic immunotherapy: Secondary | ICD-10-CM | POA: Diagnosis present

## 2014-07-28 DIAGNOSIS — K149 Disease of tongue, unspecified: Secondary | ICD-10-CM | POA: Diagnosis not present

## 2014-07-28 DIAGNOSIS — C9000 Multiple myeloma not having achieved remission: Secondary | ICD-10-CM

## 2014-07-28 DIAGNOSIS — R630 Anorexia: Secondary | ICD-10-CM | POA: Diagnosis not present

## 2014-07-28 DIAGNOSIS — N400 Enlarged prostate without lower urinary tract symptoms: Secondary | ICD-10-CM | POA: Diagnosis not present

## 2014-07-28 DIAGNOSIS — K219 Gastro-esophageal reflux disease without esophagitis: Secondary | ICD-10-CM | POA: Diagnosis not present

## 2014-07-28 DIAGNOSIS — M899 Disorder of bone, unspecified: Secondary | ICD-10-CM | POA: Diagnosis not present

## 2014-07-28 DIAGNOSIS — B370 Candidal stomatitis: Secondary | ICD-10-CM

## 2014-07-28 DIAGNOSIS — R634 Abnormal weight loss: Secondary | ICD-10-CM | POA: Diagnosis not present

## 2014-07-28 DIAGNOSIS — D472 Monoclonal gammopathy: Secondary | ICD-10-CM | POA: Diagnosis not present

## 2014-07-28 LAB — CBC WITH DIFFERENTIAL/PLATELET
Basophils Absolute: 0 10*3/uL (ref 0.0–0.1)
Basophils Relative: 0 % (ref 0–1)
Eosinophils Absolute: 0.1 10*3/uL (ref 0.0–0.7)
Eosinophils Relative: 2 % (ref 0–5)
HCT: 40.5 % (ref 39.0–52.0)
Hemoglobin: 13.3 g/dL (ref 13.0–17.0)
Lymphocytes Relative: 21 % (ref 12–46)
Lymphs Abs: 0.9 10*3/uL (ref 0.7–4.0)
MCH: 29.9 pg (ref 26.0–34.0)
MCHC: 32.8 g/dL (ref 30.0–36.0)
MCV: 91 fL (ref 78.0–100.0)
Monocytes Absolute: 0.6 10*3/uL (ref 0.1–1.0)
Monocytes Relative: 14 % — ABNORMAL HIGH (ref 3–12)
Neutro Abs: 2.7 10*3/uL (ref 1.7–7.7)
Neutrophils Relative %: 63 % (ref 43–77)
Platelets: 154 10*3/uL (ref 150–400)
RBC: 4.45 MIL/uL (ref 4.22–5.81)
RDW: 13.9 % (ref 11.5–15.5)
WBC: 4.2 10*3/uL (ref 4.0–10.5)

## 2014-07-28 LAB — COMPREHENSIVE METABOLIC PANEL
ALT: 10 U/L — ABNORMAL LOW (ref 17–63)
AST: 16 U/L (ref 15–41)
Albumin: 3.3 g/dL — ABNORMAL LOW (ref 3.5–5.0)
Alkaline Phosphatase: 23 U/L — ABNORMAL LOW (ref 38–126)
Anion gap: 10 (ref 5–15)
BUN: 15 mg/dL (ref 6–20)
CO2: 24 mmol/L (ref 22–32)
Calcium: 8.4 mg/dL — ABNORMAL LOW (ref 8.9–10.3)
Chloride: 107 mmol/L (ref 101–111)
Creatinine, Ser: 0.71 mg/dL (ref 0.61–1.24)
GFR calc Af Amer: >60 mL/min (ref >60)
GFR calc non Af Amer: >60 mL/min (ref >60)
Glucose, Bld: 97 mg/dL (ref 65–99)
Potassium: 3.9 mmol/L (ref 3.5–5.1)
Sodium: 141 mmol/L (ref 135–145)
Total Bilirubin: 0.6 mg/dL (ref 0.3–1.2)
Total Protein: 6.1 g/dL — ABNORMAL LOW (ref 6.5–8.1)

## 2014-07-28 LAB — SEDIMENTATION RATE: Sed Rate: 48 mm/hr — ABNORMAL HIGH (ref 0–16)

## 2014-07-28 LAB — LACTATE DEHYDROGENASE: LDH: 136 U/L (ref 98–192)

## 2014-07-28 LAB — C-REACTIVE PROTEIN: CRP: 1.8 mg/dL — ABNORMAL HIGH (ref <1.0)

## 2014-07-28 MED ORDER — FIRST-DUKES MOUTHWASH MT SUSP: 5.0000 mL | Freq: Four times a day (QID) | OROMUCOSAL | Status: DC | PRN

## 2014-07-28 MED ORDER — SODIUM CHLORIDE 0.9 % IV SOLN: Freq: Once | INTRAVENOUS | Status: DC

## 2014-07-28 MED ORDER — BORTEZOMIB CHEMO SQ INJECTION 3.5 MG (2.5MG/ML)
1.3000 mg/m2 | Freq: Once | INTRAMUSCULAR | Status: AC
  Administered 2014-07-28: 3 mg via SUBCUTANEOUS
  Filled 2014-07-28: qty 3

## 2014-07-28 MED ORDER — ONDANSETRON HCL 4 MG PO TABS
8.0000 mg | ORAL_TABLET | Freq: Once | ORAL | Status: AC
  Administered 2014-07-28: 8 mg via ORAL
  Filled 2014-07-28: qty 2

## 2014-07-28 NOTE — Progress Notes (Signed)
Labs drawn

## 2014-07-28 NOTE — Patient Instructions (Signed)
[facility] Discharge Instructions for Patients Receiving Chemotherapy  Today you received the following chemotherapy agents Velcade injection as ordered.  [name] will send a prescription to your pharmacy to treat the thrush that is in your mouth. Take as directed. To help prevent nausea and vomiting after your treatment, we encourage you to take your nausea medication as instructed. If you develop nausea and vomiting that is not controlled by your nausea medication, call the clinic. If it is after clinic hours your family physician or the after hours number for the clinic or go to the Emergency Department. BELOW ARE SYMPTOMS THAT SHOULD BE REPORTED IMMEDIATELY:  *FEVER GREATER THAN 101.0 F  *CHILLS WITH OR WITHOUT FEVER  NAUSEA AND VOMITING THAT IS NOT CONTROLLED WITH YOUR NAUSEA MEDICATION  *UNUSUAL SHORTNESS OF BREATH  *UNUSUAL BRUISING OR BLEEDING  TENDERNESS IN MOUTH AND THROAT WITH OR WITHOUT PRESENCE OF ULCERS  *URINARY PROBLEMS  *BOWEL PROBLEMS  UNUSUAL RASH Items with * indicate a potential emergency and should be followed up as soon as possible.  Return as scheduled.  I have been informed and understand all the instructions given to me. I know to contact the clinic, my physician, or go to the Emergency Department if any problems should occur. I do not have any questions at this time, but understand that I may call the clinic during office hours or the Patient Navigator at [phone] should I have any questions or need assistance in obtaining follow up care.    __________________________________________  _____________  __________ Signature of Patient or Authorized Representative            Date                   Time    __________________________________________ Nurse's Signature

## 2014-07-28 NOTE — Progress Notes (Signed)
Patient complains of "thrush" in mouth since Monday this week. Seen by [facility] for complaint.

## 2014-07-28 NOTE — Progress Notes (Signed)
Patient is seen as a work-in today.  He is due for Velcade.  He notes a "film" on his tongue that he can "scrape off."  On exam, I do not see any clear signs of thrush.  No white plaques.  No signs on his buccal mucosa.    He reports that he was given a mouthwash in the past that was effective for this symptom in the past.  I will call in [place].  Chaniya Genter 07/28/2014 11:58 AM

## 2014-07-29 LAB — BETA 2 MICROGLOBULIN, SERUM: Beta-2 Microglobulin: 2.4 mg/L (ref 0.6–2.4)

## 2014-07-30 LAB — KAPPA/LAMBDA LIGHT CHAINS
Kappa free light chain: 33.96 mg/L — ABNORMAL HIGH (ref 3.30–19.40)
Kappa, lambda light chain ratio: 2.6 — ABNORMAL HIGH (ref 0.26–1.65)
Lambda free light chains: 13.07 mg/L (ref 5.71–26.30)

## 2014-07-31 LAB — MULTIPLE MYELOMA PANEL, SERUM
Albumin SerPl Elph-Mcnc: 3.1 g/dL (ref 2.9–4.4)
Albumin/Glob SerPl: 1.3 (ref 0.7–1.7)
Alpha 1: 0.3 g/dL (ref 0.0–0.4)
Alpha2 Glob SerPl Elph-Mcnc: 0.9 g/dL (ref 0.4–1.0)
B-Globulin SerPl Elph-Mcnc: 0.9 g/dL (ref 0.7–1.3)
Gamma Glob SerPl Elph-Mcnc: 0.3 g/dL — ABNORMAL LOW (ref 0.4–1.8)
Globulin, Total: 2.5 g/dL (ref 2.2–3.9)
IgA: 76 mg/dL (ref 61–437)
IgG (Immunoglobin G), Serum: 406 mg/dL — ABNORMAL LOW (ref 700–1600)
IgM, Serum: 19 mg/dL (ref 15–143)
M Protein SerPl Elph-Mcnc: 0.1 g/dL — ABNORMAL HIGH
Total Protein ELP: 5.6 g/dL — ABNORMAL LOW (ref 6.0–8.5)

## 2014-08-03 ENCOUNTER — Other Ambulatory Visit (HOSPITAL_COMMUNITY): Payer: Medicare Other

## 2014-08-03 ENCOUNTER — Ambulatory Visit (HOSPITAL_COMMUNITY): Payer: Medicare Other

## 2014-08-04 ENCOUNTER — Encounter (HOSPITAL_COMMUNITY): Payer: Medicare Other | Attending: Hematology and Oncology

## 2014-08-04 ENCOUNTER — Encounter (HOSPITAL_BASED_OUTPATIENT_CLINIC_OR_DEPARTMENT_OTHER): Payer: Medicare Other | Admitting: Hematology & Oncology

## 2014-08-04 ENCOUNTER — Encounter (HOSPITAL_COMMUNITY): Payer: Self-pay | Admitting: Hematology & Oncology

## 2014-08-04 ENCOUNTER — Encounter (HOSPITAL_BASED_OUTPATIENT_CLINIC_OR_DEPARTMENT_OTHER): Payer: Medicare Other

## 2014-08-04 VITALS — BP 110/72 | HR 72 | Temp 97.8°F | Resp 18 | Wt 228.2 lb

## 2014-08-04 DIAGNOSIS — L270 Generalized skin eruption due to drugs and medicaments taken internally: Secondary | ICD-10-CM | POA: Diagnosis not present

## 2014-08-04 DIAGNOSIS — C9000 Multiple myeloma not having achieved remission: Secondary | ICD-10-CM

## 2014-08-04 DIAGNOSIS — G4700 Insomnia, unspecified: Secondary | ICD-10-CM | POA: Diagnosis not present

## 2014-08-04 DIAGNOSIS — R634 Abnormal weight loss: Secondary | ICD-10-CM | POA: Insufficient documentation

## 2014-08-04 DIAGNOSIS — R630 Anorexia: Secondary | ICD-10-CM | POA: Diagnosis not present

## 2014-08-04 DIAGNOSIS — D472 Monoclonal gammopathy: Secondary | ICD-10-CM | POA: Insufficient documentation

## 2014-08-04 DIAGNOSIS — S32000D Wedge compression fracture of unspecified lumbar vertebra, subsequent encounter for fracture with routine healing: Secondary | ICD-10-CM

## 2014-08-04 DIAGNOSIS — M899 Disorder of bone, unspecified: Secondary | ICD-10-CM

## 2014-08-04 DIAGNOSIS — N400 Enlarged prostate without lower urinary tract symptoms: Secondary | ICD-10-CM | POA: Diagnosis not present

## 2014-08-04 DIAGNOSIS — M542 Cervicalgia: Secondary | ICD-10-CM | POA: Insufficient documentation

## 2014-08-04 DIAGNOSIS — G629 Polyneuropathy, unspecified: Secondary | ICD-10-CM

## 2014-08-04 DIAGNOSIS — K219 Gastro-esophageal reflux disease without esophagitis: Secondary | ICD-10-CM | POA: Diagnosis not present

## 2014-08-04 DIAGNOSIS — Z923 Personal history of irradiation: Secondary | ICD-10-CM | POA: Diagnosis not present

## 2014-08-04 LAB — COMPREHENSIVE METABOLIC PANEL
ALT: 12 U/L — ABNORMAL LOW (ref 17–63)
AST: 18 U/L (ref 15–41)
Albumin: 3.3 g/dL — ABNORMAL LOW (ref 3.5–5.0)
Alkaline Phosphatase: 23 U/L — ABNORMAL LOW (ref 38–126)
Anion gap: 9 (ref 5–15)
BUN: 16 mg/dL (ref 6–20)
CO2: 24 mmol/L (ref 22–32)
Calcium: 8.4 mg/dL — ABNORMAL LOW (ref 8.9–10.3)
Chloride: 106 mmol/L (ref 101–111)
Creatinine, Ser: 0.69 mg/dL (ref 0.61–1.24)
GFR calc Af Amer: >60 mL/min (ref >60)
GFR calc non Af Amer: >60 mL/min (ref >60)
Glucose, Bld: 105 mg/dL — ABNORMAL HIGH (ref 65–99)
Potassium: 4 mmol/L (ref 3.5–5.1)
Sodium: 139 mmol/L (ref 135–145)
Total Bilirubin: 0.7 mg/dL (ref 0.3–1.2)
Total Protein: 5.9 g/dL — ABNORMAL LOW (ref 6.5–8.1)

## 2014-08-04 LAB — CBC WITH DIFFERENTIAL/PLATELET
Basophils Absolute: 0 10*3/uL (ref 0.0–0.1)
Basophils Relative: 0 % (ref 0–1)
Eosinophils Absolute: 0.2 10*3/uL (ref 0.0–0.7)
Eosinophils Relative: 3 % (ref 0–5)
HCT: 39.4 % (ref 39.0–52.0)
Hemoglobin: 13.2 g/dL (ref 13.0–17.0)
Lymphocytes Relative: 16 % (ref 12–46)
Lymphs Abs: 0.9 10*3/uL (ref 0.7–4.0)
MCH: 30.1 pg (ref 26.0–34.0)
MCHC: 33.5 g/dL (ref 30.0–36.0)
MCV: 90 fL (ref 78.0–100.0)
Monocytes Absolute: 0.9 10*3/uL (ref 0.1–1.0)
Monocytes Relative: 17 % — ABNORMAL HIGH (ref 3–12)
Neutro Abs: 3.5 10*3/uL (ref 1.7–7.7)
Neutrophils Relative %: 64 % (ref 43–77)
Platelets: 134 10*3/uL — ABNORMAL LOW (ref 150–400)
RBC: 4.38 MIL/uL (ref 4.22–5.81)
RDW: 13.8 % (ref 11.5–15.5)
WBC: 5.5 10*3/uL (ref 4.0–10.5)

## 2014-08-04 MED ORDER — SODIUM CHLORIDE 0.9 % IJ SOLN
10.0000 mL | INTRAMUSCULAR | Status: DC | PRN
  Administered 2014-08-04: 10 mL
  Filled 2014-08-04: qty 10

## 2014-08-04 MED ORDER — ZOLEDRONIC ACID 4 MG/5ML IV CONC
4.0000 mg | Freq: Once | INTRAVENOUS | Status: AC
  Administered 2014-08-04: 4 mg via INTRAVENOUS
  Filled 2014-08-04: qty 5

## 2014-08-04 MED ORDER — SODIUM CHLORIDE 0.9 % IV SOLN
Freq: Once | INTRAVENOUS | Status: AC
  Administered 2014-08-04: 11:00:00 via INTRAVENOUS

## 2014-08-04 NOTE — Patient Instructions (Signed)
[place] at [facility] Discharge Instructions  RECOMMENDATIONS MADE BY THE CONSULTANT AND ANY TEST RESULTS WILL BE SENT TO YOUR REFERRING PHYSICIAN.  Exam and discussion today with Dr. Whitney Muse. Hold Velcade today. Zometa infusion today.  Thank you for choosing [place] at [facility] to provide your oncology and hematology care.  To afford each patient quality time with our provider, please arrive at least 15 minutes before your scheduled appointment time.    You need to re-schedule your appointment should you arrive 10 or more minutes late.  We strive to give you quality time with our providers, and arriving late affects you and other patients whose appointments are after yours.  Also, if you no show three or more times for appointments you may be dismissed from the clinic at the providers discretion.     Again, thank you for choosing [facility].  Our hope is that these requests will decrease the amount of time that you wait before being seen by our physicians.       _____________________________________________________________  Should you have questions after your visit to [facility], please contact our office at [phone] between the hours of 8:30 a.m. and 4:30 p.m.  Voicemails left after 4:30 p.m. will not be returned until the following business day.  For prescription refill requests, have your pharmacy contact our office.

## 2014-08-04 NOTE — Patient Instructions (Signed)
[place] at [facility] Discharge Instructions  RECOMMENDATIONS MADE BY THE CONSULTANT AND ANY TEST RESULTS WILL BE SENT TO YOUR REFERRING PHYSICIAN.  Exam and discussion by Dr. Whitney Muse. Continue revlimid but take daily Continue dexamethasone Continue IV zometa Will stop Velcade for now  PET Scan - Will be done at [facility].  Nothing to eat or drink 6 hours prior to the scan. Do not eat or drink anything with sugar in it - no chewing gum, hard candy, etc. Follow-up after PET scan.  Thank you for choosing [place] at [facility] to provide your oncology and hematology care.  To afford each patient quality time with our provider, please arrive at least 15 minutes before your scheduled appointment time.    You need to re-schedule your appointment should you arrive 10 or more minutes late.  We strive to give you quality time with our providers, and arriving late affects you and other patients whose appointments are after yours.  Also, if you no show three or more times for appointments you may be dismissed from the clinic at the providers discretion.     Again, thank you for choosing [facility].  Our hope is that these requests will decrease the amount of time that you wait before being seen by our physicians.       _____________________________________________________________  Should you have questions after your visit to [facility], please contact our office at [phone] between the hours of 8:30 a.m. and 4:30 p.m.  Voicemails left after 4:30 p.m. will not be returned until the following business day.  For prescription refill requests, have your pharmacy contact our office.

## 2014-08-04 NOTE — Progress Notes (Signed)
Dean Fraction, MD [name] [place] [id number]  Multiple Myeloma, IgG kappa  Left occipital condyle infiltrative lesion, not amenable to biopsy. Palliative XRT without a tissue diagnosis  Chest x-ray and right rib films were obtained which showed a soft tissue mass along the right posterior fifth rib with some rib destruction.  Monoclonal IgG kappa, normal IgG levels, suppressed IgM, abnormal kappa/lamda ratio  PET/CT  03/16/2014 Scattered hypermetabolic osseous lesions, including the calvarium, ribs, left scapula, sacrum, and left femoral shaft. An expansile left vertebral body lesion at L3 extends into the epidural space of the left lateral recess, and probably merits lumbar spine MRI assessment.  BMBX on 02/20/2014 with 33% kappa restricted plasma cells Normal FISH, normal cytogenetics  Peripheral evaluation shows very small M spike, normal CBC, normal CMP, myeloma survey grossly underestimates extent of bone involvement  CURRENT THERAPY: RVD beginning on 04/07/2014  INTERVAL HISTORY: Dean [name] 79 y.o. male returns for followup of multiple myeloma, monoclonal IgG kappa with scattered osseous lesions.  He is eating well. He reports alternating swelling of his ankles. He is taking his calcium supplements. His sleep has improved with nightly doxepin. Quinine water has alleviated his cramping. He has no major complaints today. He is compliant with his medication. He will finish this cycle of Revlimid on Wednesday the 18th. //  He is here alone today. He takes Revlimid every other day. When he takes it he breaks out into an itchy rash.  He describes his legs as being so-so today. They feel weak and not normal. His neuropathy has moved up to his ankles now. He is wearing sunglasses, he says his eyes are okay. He is not eating well due to food no longer having taste or smell.     Multiple myeloma without remission   11/16/2012 Initial Diagnosis  L occipital condyle destructive lesion, not biopsied secondary to location. XRT   12/01/2012 Imaging L3 superior endplate compression FX, no lytic lesions to suggest myeloma   02/06/2014 Imaging soft tissue mass along the right posterior fifth rib with some rib destruction   02/16/2014 Miscellaneous Normal CBC, Normal CMP, kappa,lamda ratio of 2.62 (abnormal), UIEP with slightly restricted band, IgG kappa, SPEP/IEP with monoclonal protein at 0.79 g/dl, normal igG, suppressed IGM   02/20/2014 Bone Marrow Biopsy 33% kappa restricted plasma cells Normal FISH, normal cytogenetics   02/21/2014 - 03/08/2014 Radiation Therapy 30Gy to R rib lesion, lesion not biopsied   03/16/2014 PET scan Scattered hypermetabolic osseous lesions, including the calvarium, ribs, left scapula, sacrum, and left femoral shaft. An expansile left vertebral body lesion at L3 extends into the epidural space of the left lateral recess,    03/17/2014 - 04/07/2014 Chemotherapy Velcade and Dexamethasone due to initial denial of Revlimid by insurance company.  Zometa monthly.   03/22/2014 Imaging MR_ L-Spine- Enhancing lesions compatible with multiple myeloma at L2, L3, and S1-2.   04/07/2014 - 07/28/2014 Chemotherapy RVD with Revlimid at 25 mg days 1-14.  Revlimid dose reduced to 25 mg every other day x 14 days with 7 day respite beginning on day 1 of cycle 2.   04/17/2014 Adverse Reaction Revlimid-induced rash.  Treated with steroids.   07/28/2014 -  Chemotherapy Revlimid daily   08/04/2014 Adverse Reaction Velcade-induced peripheral neuropathy   08/04/2014 Treatment Plan Change D/C Velcade   08/11/2014 PET scan Response to therapy. Improvement and resolution of foci of osseous hypermetabolism.     Past Medical  History  Diagnosis Date  . Neuropathy   . BPH (benign prostatic hyperplasia)   . GERD (gastroesophageal reflux disease)   . H/O ETOH abuse   . Skull lesion     Left occipital condyle  . History of radiation therapy 01/05/13-02/10/13    45 gray  to left occipital condyle region  . Multiple myeloma   . Radiation 02/21/14-03/08/14    right posterior chest wall area 30 gray  . Radiation 04/19/14-05/02/14    lumbar spine 25 gray    has Neuropathy; BPH (benign prostatic hyperplasia); GERD (gastroesophageal reflux disease); H/O ETOH abuse; Skull lesion; Neck pain; Fatigue; Loss of weight; Insomnia; Allergic rhinitis; Lumbar compression fracture; Dermatitis; and Multiple myeloma without remission on his problem list.     is allergic to codeine and morphine and related.  Mr. Noxon does not currently have medications on file.  Past Surgical History  Procedure Laterality Date  . Cyst excision  1959    tail bone    Denies any headaches, dizziness, double vision, fevers, chills, night sweats, nausea, vomiting, diarrhea, constipation, chest pain, heart palpitations, shortness of breath, blood in stool, black tarry stool, urinary pain, urinary burning, urinary frequency, hematuria. Positive for neuropathy and appetite loss. Neuropathy has moved to his ankles. Appetite loss due to loss in taste and smell.   PHYSICAL EXAMINATION  ECOG PERFORMANCE STATUS: 0 - Asymptomatic  Filed Vitals:   08/04/14 1019  BP: 110/72  Pulse: 72  Temp: 97.8 F (36.6 C)  Resp: 18    GENERAL:alert, no distress, well nourished, well developed, comfortable, cooperative, obese and smiling SKIN: skin color, texture, turgor are normal, no rashes or significant lesions HEAD: Normocephalic, No masses, lesions, tenderness or abnormalities EYES: normal, PERRLA, EOMI, Conjunctiva are pink and non-injected EARS: External ears normal OROPHARYNX:lips, buccal mucosa, and tongue normal and mucous membranes are moist  NECK: supple, no adenopathy, thyroid normal size, non-tender, without nodularity, no stridor, non-tender, trachea midline LYMPH:  no palpable lymphadenopathy BREAST:not examined LUNGS: clear to auscultation  HEART: regular rate & rhythm, no murmurs and no  gallops ABDOMEN:abdomen soft, non-tender, obese and normal bowel sounds BACK: Back symmetric, no curvature., No CVA tenderness EXTREMITIES:less then 2 second capillary refill, no joint deformities, effusion, or inflammation, no edema, no skin discoloration, no clubbing, no cyanosis  NEURO: alert & oriented x 3 with fluent speech, no focal motor/sensory deficits, gait normal   LABORATORY DATA: CBC    Component Value Date/Time   WBC 5.5 08/04/2014 0958   RBC 4.38 08/04/2014 0958   HGB 13.2 08/04/2014 0958   HCT 39.4 08/04/2014 0958   PLT 134* 08/04/2014 0958   MCV 90.0 08/04/2014 0958   MCH 30.1 08/04/2014 0958   MCHC 33.5 08/04/2014 0958   RDW 13.8 08/04/2014 0958   LYMPHSABS 0.9 08/04/2014 0958   MONOABS 0.9 08/04/2014 0958   EOSABS 0.2 08/04/2014 0958   BASOSABS 0.0 08/04/2014 0958      Chemistry      Component Value Date/Time   NA 139 08/04/2014 0958   K 4.0 08/04/2014 0958   CL 106 08/04/2014 0958   CO2 24 08/04/2014 0958   BUN 16 08/04/2014 0958   CREATININE 0.69 08/04/2014 0958   CREATININE 0.76 02/06/2014 1249      Component Value Date/Time   CALCIUM 8.4* 08/04/2014 0958   ALKPHOS 23* 08/04/2014 0958   AST 18 08/04/2014 0958   ALT 12* 08/04/2014 0958   BILITOT 0.7 08/04/2014 0958     Lab Results  Component Value Date   PROT 5.9* 08/04/2014   ALBUMINELP 52.2* 04/07/2014   A1GS 6.7* 04/07/2014   A2GS 15.4* 04/07/2014   BETS 6.0 04/07/2014   BETA2SER 5.3 04/07/2014   GAMS 14.4 04/07/2014   MSPIKE 0.45 04/07/2014   SPEI (NOTE) 04/07/2014   SPECOM (NOTE) 04/07/2014   IGGSERUM 406* 07/28/2014   IGA 76 07/28/2014   IGMSERUM 19 07/28/2014   IMMELINT (NOTE) 04/07/2014   KPAFRELGTCHN 33.96* 07/28/2014   LAMBDASER 13.07 07/28/2014   KAPLAMBRATIO 2.60* 07/28/2014     ASSESSMENT AND PLAN:   IgG kappa myeloma Multiple bone lesions seen on PET imaging RVD therapy Rash on revlimid, currently on every other day dosing Insomnia  Unfortunately he has had  trouble with Revlimid, causing a mild rash.  He has been very reluctant to work with me on trying daily dosing. I have had many patients whose rash resolves with appropriate management over time.  M spike has fallen from 0.8 to 0.1, but the only reasonable way to follow his disease is via PET imaging.   He has completed 6 cycles of RVD. We will obtain another PET scan to evaluate his disease and for additional treatment planning. Currently however, I am going to take a break from velcade as his neuropathy is "moving up to his mid calf" We may consider re-instituting therapy at reduced dose.   We will continue the monthly Zometa.  All questions were answered. The patient knows to call the clinic with any problems, questions or concerns. We can certainly see the patient much sooner if necessary.  This note is electronically signed.  This document serves as a record of services personally performed by Ancil Linsey, MD. It was created on her behalf by Arlyce Harman, a trained medical scribe. The creation of this record is based on the scribe's personal observations and the provider's statements to them. This document has been checked and approved by the attending provider.  I have reviewed the above documentation for accuracy and completeness, and I agree with the above.  Kelby Fam. Penland MD

## 2014-08-04 NOTE — Progress Notes (Signed)
LABS DRAWN

## 2014-08-04 NOTE — Progress Notes (Signed)
1100:  Hold Velcade today per Dr. Whitney Muse. Okay to give Zometa.

## 2014-08-11 ENCOUNTER — Ambulatory Visit (HOSPITAL_COMMUNITY)
Admission: RE | Admit: 2014-08-11 | Discharge: 2014-08-11 | Disposition: A | Discharge status: Home / Self Care | Payer: Medicare Other | Source: Ambulatory Visit | Attending: Hematology & Oncology | Admitting: Hematology & Oncology

## 2014-08-11 ENCOUNTER — Encounter: Payer: Self-pay | Admitting: Dietician

## 2014-08-11 DIAGNOSIS — F1010 Alcohol abuse, uncomplicated: Secondary | ICD-10-CM | POA: Insufficient documentation

## 2014-08-11 DIAGNOSIS — C9000 Multiple myeloma not having achieved remission: Secondary | ICD-10-CM | POA: Insufficient documentation

## 2014-08-11 DIAGNOSIS — S32000D Wedge compression fracture of unspecified lumbar vertebra, subsequent encounter for fracture with routine healing: Secondary | ICD-10-CM

## 2014-08-11 DIAGNOSIS — Z79899 Other long term (current) drug therapy: Secondary | ICD-10-CM | POA: Insufficient documentation

## 2014-08-11 DIAGNOSIS — Z923 Personal history of irradiation: Secondary | ICD-10-CM | POA: Diagnosis not present

## 2014-08-11 DIAGNOSIS — M899 Disorder of bone, unspecified: Secondary | ICD-10-CM

## 2014-08-11 LAB — GLUCOSE, CAPILLARY: Glucose-Capillary: 91 mg/dL (ref 65–99)

## 2014-08-11 MED ORDER — FLUDEOXYGLUCOSE F - 18 (FDG) INJECTION
11.3600 | Freq: Once | INTRAVENOUS | Status: AC | PRN
  Administered 2014-08-11: 11.36 via INTRAVENOUS

## 2014-08-11 NOTE — Progress Notes (Signed)
Patient identified to be at risk for malnutrition on the MST secondary to Weight Loss and poor appetite  Contacted Pt by Phone  Wt Readings from Last 10 Encounters:  08/04/14 228 lb 3.2 oz (103.511 kg)  07/28/14 230 lb 3.2 oz (104.418 kg)  07/21/14 236 lb 3.2 oz (107.14 kg)  07/14/14 233 lb 6.4 oz (105.87 kg)  06/29/14 233 lb 12.8 oz (106.051 kg)  06/23/14 237 lb 8 oz (107.729 kg)  06/21/14 236 lb 12.8 oz (107.412 kg)  06/16/14 235 lb 6.4 oz (106.777 kg)  06/09/14 232 lb 6.4 oz (105.416 kg)  06/02/14 231 lb 6.4 oz (104.962 kg)   Patient weight has decreased by 5 lbs in 1 month  Patient reports oral intake as fair and is suffering from symptoms including loss of appetite and taste changes. He denies n/v/d. He sometimes gets constipation but takes a laxative which helps him  He tries to eat 3 meals a day, but it sounds like that doesn't always happen. He does snack throughout the day on things such as fruit.  Patient does snack throughout the day. He drinks carnation and Boost 2x a day. Will send coupons.   I reiterated to patient the importance of protein. While fruit is delicious, it does not contain protein. I listed off some good options such as cheese, eggs, meat, peanut butter, egg/chicken salad, cream soups etc. He stated that he eats these as well  He does get some constipation. Also has some taste changes. Sweet and salty are the two things that he can taste "a little of". He also reports metallic tastes.  He is wondering how long it will last and if it was related to the chemo or radiation.   Though I am not certain, I told him it is likely from the chemo and the side effects will persist until he has been off of the drugs for a little while. He has f/u next week and told him to to discuss this with the PA to be sure.   Pt seemed to be appreciative of the call  Mailed coupons and handouts titled "Soft and Moist High Protein Menu Ideas" and "Taste and Smell changes"  Burtis Junes RD, LDN Nutrition Pager: [phone] 08/11/2014 3:54 PM

## 2014-08-14 ENCOUNTER — Encounter (HOSPITAL_BASED_OUTPATIENT_CLINIC_OR_DEPARTMENT_OTHER): Payer: Medicare Other | Admitting: Oncology

## 2014-08-14 VITALS — BP 126/76 | HR 78 | Temp 98.1°F | Resp 18 | Wt 230.0 lb

## 2014-08-14 DIAGNOSIS — C9000 Multiple myeloma not having achieved remission: Secondary | ICD-10-CM

## 2014-08-14 DIAGNOSIS — M899 Disorder of bone, unspecified: Secondary | ICD-10-CM | POA: Diagnosis not present

## 2014-08-14 MED ORDER — LENALIDOMIDE 25 MG PO CAPS: ORAL_CAPSULE | ORAL | Status: DC

## 2014-08-14 NOTE — Progress Notes (Signed)
Dean Fraction, MD [place] [address]  Multiple myeloma without remission - Plan: CBC with Differential, Comprehensive metabolic panel, Lactate dehydrogenase, Sedimentation rate, C-reactive protein, Beta 2 microglobuline, serum, Multiple myeloma panel, serum, Kappa/lambda light chains, lenalidomide (REVLIMID) 25 MG capsule  CURRENT THERAPY: Moving on Rd therapy.  INTERVAL HISTORY: Dean [name] 79 y.o. male returns for followup of multiple myeloma, monoclonal IgG kappa with scattered osseous lesions.     Multiple myeloma without remission   11/16/2012 Initial Diagnosis L occipital condyle destructive lesion, not biopsied secondary to location. XRT   12/01/2012 Imaging L3 superior endplate compression FX, no lytic lesions to suggest myeloma   02/06/2014 Imaging soft tissue mass along the right posterior fifth rib with some rib destruction   02/16/2014 Miscellaneous Normal CBC, Normal CMP, kappa,lamda ratio of 2.62 (abnormal), UIEP with slightly restricted band, IgG kappa, SPEP/IEP with monoclonal protein at 0.79 g/dl, normal igG, suppressed IGM   02/20/2014 Bone Marrow Biopsy 33% kappa restricted plasma cells Normal FISH, normal cytogenetics   02/21/2014 - 03/08/2014 Radiation Therapy 30Gy to R rib lesion, lesion not biopsied   03/16/2014 PET scan Scattered hypermetabolic osseous lesions, including the calvarium, ribs, left scapula, sacrum, and left femoral shaft. An expansile left vertebral body lesion at L3 extends into the epidural space of the left lateral recess,    03/17/2014 - 04/07/2014 Chemotherapy Velcade and Dexamethasone due to initial denial of Revlimid by insurance company.  Zometa monthly.   03/22/2014 Imaging MR_ L-Spine- Enhancing lesions compatible with multiple myeloma at L2, L3, and S1-2.   04/07/2014 - 07/28/2014 Chemotherapy RVD with Revlimid at 25 mg days 1-14.  Revlimid dose reduced to 25 mg every other day x 14 days with 7 day respite  beginning on day 1 of cycle 2.   04/17/2014 Adverse Reaction Revlimid-induced rash.  Treated with steroids.   07/28/2014 -  Chemotherapy Revlimid daily   08/04/2014 Adverse Reaction Velcade-induced peripheral neuropathy   08/04/2014 Treatment Plan Change D/C Velcade   08/11/2014 PET scan Response to therapy. Improvement and resolution of foci of osseous hypermetabolism.    I personally reviewed and went over laboratory results with the patient.  The results are noted within this dictation.  I personally reviewed and went over radiographic studies with the patient.  The results are noted within this dictation.  PET imaging is reviewed with the patient.  He demonstrates a response to therapy.  He reports he feels better off Velcade.  Past Medical History  Diagnosis Date  . Neuropathy   . BPH (benign prostatic hyperplasia)   . GERD (gastroesophageal reflux disease)   . H/O ETOH abuse   . Skull lesion     Left occipital condyle  . History of radiation therapy 01/05/13-02/10/13    45 gray to left occipital condyle region  . Multiple myeloma   . Radiation 02/21/14-03/08/14    right posterior chest wall area 30 gray  . Radiation 04/19/14-05/02/14    lumbar spine 25 gray    has Neuropathy; BPH (benign prostatic hyperplasia); GERD (gastroesophageal reflux disease); H/O ETOH abuse; Skull lesion; Neck pain; Fatigue; Loss of weight; Insomnia; Allergic rhinitis; Lumbar compression fracture; Dermatitis; and Multiple myeloma without remission on his problem list.     is allergic to codeine and morphine and related.  Current Outpatient Prescriptions on File Prior to Visit  Medication Sig Dispense Refill  . acyclovir (ZOVIRAX) 400 MG tablet Take 1 tablet (400 mg total)  by mouth 2 (two) times daily. 60 tablet 5  . ALPRAZolam (XANAX) 1 MG tablet Take 1-2 tablets (1-2 mg total) by mouth at bedtime as needed for sleep. For anxiety and/or sleep 60 tablet 2  . aspirin EC 325 MG tablet Take 1 tablet (325 mg total) by  mouth daily. 30 tablet 11  . Aspirin-Salicylamide-Caffeine (BC HEADACHE POWDER PO) Take 1 packet by mouth 2 (two) times daily as needed.    . Calcium Carb-Cholecalciferol (CALCIUM-VITAMIN D3) 600-500 MG-UNIT CAPS Take 1 tablet by mouth 2 (two) times daily. With food 60 capsule 6  . cetirizine (ZYRTEC) 10 MG tablet Take 10 mg by mouth daily.    . clotrimazole (LOTRIMIN) 1 % cream Apply 1 application topically 2 (two) times daily. To ear 45 g 2  . dexamethasone (DECADRON) 4 MG tablet Take 10 tablets (41m total) on Days 1, 8, & 15 of chemo. 70 tablet 1  . Diphenhyd-Hydrocort-Nystatin (FIRST-DUKES MOUTHWASH) SUSP Use as directed 5 mLs in the mouth or throat 4 (four) times daily as needed. (Patient not taking: Reported on 08/04/2014) 300 mL 1  . diphenhydrAMINE (BENADRYL) 25 MG tablet Take 25 mg by mouth at bedtime as needed for sleep.    . Doxepin HCl (SILENOR) 6 MG TABS Take 1 tablet (6 mg total) by mouth at bedtime. 30 tablet 6  . gabapentin (NEURONTIN) 400 MG capsule Take 1 capsule (400 mg total) by mouth 4 (four) times daily. May take up to five times daily depending on pain in feet 180 capsule 5  . omeprazole (PRILOSEC) 20 MG capsule Take 1 capsule (20 mg total) by mouth daily. 30 capsule 5  . ondansetron (ZOFRAN) 8 MG tablet Take 1 tablet (8 mg total) by mouth every 8 (eight) hours as needed for nausea or vomiting. (Patient not taking: Reported on 07/14/2014) 30 tablet 2  . oxyCODONE-acetaminophen (PERCOCET) 10-325 MG per tablet Take 1 tablet by mouth every 4 (four) hours as needed for pain. 60 tablet 0  . tamsulosin (FLOMAX) 0.4 MG CAPS capsule TAKE 1 CAPSULE AT BEDTIME 30 capsule 3  . Zoledronic Acid (ZOMETA IV) Inject into the vein. Every 28 days    . zolpidem (AMBIEN) 5 MG tablet Take 1-2 tablets (5-10 mg total) by mouth at bedtime as needed for sleep. (Patient not taking: Reported on 05/19/2014) 60 tablet 1   Current Facility-Administered Medications on File Prior to Visit  Medication Dose  Route Frequency Provider Last Rate Last Dose  . sodium chloride 0.9 % injection 10 mL  10 mL Intracatheter PRN TBaird Cancer PA-C   10 mL at 08/04/14 1103    Past Surgical History  Procedure Laterality Date  . Cyst excision  1959    tail bone    Denies any headaches, dizziness, double vision, fevers, chills, night sweats, nausea, vomiting, diarrhea, constipation, chest pain, heart palpitations, shortness of breath, blood in stool, black tarry stool, urinary pain, urinary burning, urinary frequency, hematuria.   PHYSICAL EXAMINATION  ECOG PERFORMANCE STATUS: 1 - Symptomatic but completely ambulatory  Filed Vitals:   08/14/14 1500  BP: 126/76  Pulse: 78  Temp: 98.1 F (36.7 C)  Resp: 18    GENERAL:alert, no distress, well nourished, well developed, comfortable, cooperative and smiling SKIN: skin color, texture, turgor are normal, no rashes or significant lesions HEAD: Normocephalic, No masses, lesions, tenderness or abnormalities EYES: normal, PERRLA, EOMI, Conjunctiva are pink and non-injected EARS: External ears normal, TM are pearly white without effusion B/L.  No cerumen noted  either. OROPHARYNX:lips, buccal mucosa, and tongue normal and mucous membranes are moist  NECK: supple, no adenopathy, thyroid normal size, non-tender, without nodularity, trachea midline LYMPH:  no palpable lymphadenopathy BREAST:not examined LUNGS: clear to auscultation  HEART: regular rate & rhythm, no murmurs and no gallops ABDOMEN:abdomen soft and normal bowel sounds BACK: Back symmetric, no curvature. EXTREMITIES:less then 2 second capillary refill, no joint deformities, effusion, or inflammation, no skin discoloration  NEURO: alert & oriented x 3 with fluent speech, no focal motor/sensory deficits, gait normal   LABORATORY DATA: CBC    Component Value Date/Time   WBC 5.5 08/04/2014 0958   RBC 4.38 08/04/2014 0958   HGB 13.2 08/04/2014 0958   HCT 39.4 08/04/2014 0958   PLT 134*  08/04/2014 0958   MCV 90.0 08/04/2014 0958   MCH 30.1 08/04/2014 0958   MCHC 33.5 08/04/2014 0958   RDW 13.8 08/04/2014 0958   LYMPHSABS 0.9 08/04/2014 0958   MONOABS 0.9 08/04/2014 0958   EOSABS 0.2 08/04/2014 0958   BASOSABS 0.0 08/04/2014 0958      Chemistry      Component Value Date/Time   NA 139 08/04/2014 0958   K 4.0 08/04/2014 0958   CL 106 08/04/2014 0958   CO2 24 08/04/2014 0958   BUN 16 08/04/2014 0958   CREATININE 0.69 08/04/2014 0958   CREATININE 0.76 02/06/2014 1249      Component Value Date/Time   CALCIUM 8.4* 08/04/2014 0958   ALKPHOS 23* 08/04/2014 0958   AST 18 08/04/2014 0958   ALT 12* 08/04/2014 0958   BILITOT 0.7 08/04/2014 0958        ASSESSMENT AND PLAN:  Multiple myeloma without remission Multiple myeloma, monoclonal IgG kappa with scattered osseous lesions.  PET imaging on 08/11/2014 demonstrates significant response to therapy.  Oncology history updated.  Velcade D/C'd due to PN.  Will change Revlimid to 25 mg days 1-21 every 28 days.  New Rx was printed and given the [employer].    He admits that he has not been taking his dexamethasone appropriately because he does not feel good after taking Dexamethasone.  For the time being, we will get the Revlimid going.  If he tolerates well, then we will need to discuss his Dexamethasone dosing.  He may benefit from split doing of Dexamethasone.  Labs today in 2 weeks: CBC diff, CMET, LDH, ESR, CRP, MM Panel, B2M  Return in 2 weeks for follow-up.    THERAPY PLAN:  Continue therapy as planned with re-evaluation after cycle 6 of therapy.  All questions were answered. The patient knows to call the clinic with any problems, questions or concerns. We can certainly see the patient much sooner if necessary.  Patient and plan discussed with Dr. Ancil Linsey and she is in agreement with the aforementioned.   Patient and plan discussed with Dr. Ancil Linsey and she is in agreement with the  aforementioned.   This note is electronically signed by: Doy Mince 08/14/2014 4:19 PM

## 2014-08-14 NOTE — Assessment & Plan Note (Addendum)
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  PET imaging on 08/11/2014 demonstrates significant response to therapy.  Oncology history updated.  Velcade D/C'd due to PN.  Will change Revlimid to 25 mg days 1-21 every 28 days.  New Rx was printed and given the [employer].    He admits that he has not been taking his dexamethasone appropriately because he does not feel good after taking Dexamethasone.  For the time being, we will get the Revlimid going.  If he tolerates well, then we will need to discuss his Dexamethasone dosing.  He may benefit from split doing of Dexamethasone.  Labs today in 2 weeks: CBC diff, CMET, LDH, ESR, CRP, MM Panel, B2M  Return in 2 weeks for follow-up.

## 2014-08-14 NOTE — Patient Instructions (Signed)
[place] at [facility] Discharge Instructions  RECOMMENDATIONS MADE BY THE CONSULTANT AND ANY TEST RESULTS WILL BE SENT TO YOUR REFERRING PHYSICIAN.  Exam and discussion with Kirby Crigler, PA-C. Continue aspirin 325 daily. Change Revlimid to 25mg  daily, 21 days on/7 days off. Call Lupita Raider when your new Revlimid prescription arrives. Return in 2 weeks for lab work and office visit.  Thank you for choosing [place] at [facility] to provide your oncology and hematology care.  To afford each patient quality time with our provider, please arrive at least 15 minutes before your scheduled appointment time.    You need to re-schedule your appointment should you arrive 10 or more minutes late.  We strive to give you quality time with our providers, and arriving late affects you and other patients whose appointments are after yours.  Also, if you no show three or more times for appointments you may be dismissed from the clinic at the providers discretion.     Again, thank you for choosing [facility].  Our hope is that these requests will decrease the amount of time that you wait before being seen by our physicians.       _____________________________________________________________  Should you have questions after your visit to [facility], please contact our office at [phone] between the hours of 8:30 a.m. and 4:30 p.m.  Voicemails left after 4:30 p.m. will not be returned until the following business day.  For prescription refill requests, have your pharmacy contact our office.

## 2014-08-15 ENCOUNTER — Other Ambulatory Visit (HOSPITAL_COMMUNITY): Payer: Self-pay | Admitting: Oncology

## 2014-08-15 DIAGNOSIS — C9000 Multiple myeloma not having achieved remission: Secondary | ICD-10-CM

## 2014-08-15 MED ORDER — LENALIDOMIDE 25 MG PO CAPS: ORAL_CAPSULE | ORAL | Status: DC

## 2014-08-20 ENCOUNTER — Encounter (HOSPITAL_COMMUNITY): Payer: Self-pay | Admitting: Hematology & Oncology

## 2014-08-22 ENCOUNTER — Telehealth (HOSPITAL_COMMUNITY): Payer: Self-pay | Admitting: *Deleted

## 2014-08-22 NOTE — Telephone Encounter (Signed)
Thank you.  Oncology history updated.

## 2014-08-22 NOTE — Telephone Encounter (Signed)
Started taking Revlimid today.

## 2014-08-28 ENCOUNTER — Encounter (HOSPITAL_BASED_OUTPATIENT_CLINIC_OR_DEPARTMENT_OTHER): Payer: Medicare Other | Admitting: Oncology

## 2014-08-28 ENCOUNTER — Telehealth (HOSPITAL_COMMUNITY): Payer: Self-pay

## 2014-08-28 VITALS — BP 117/74 | HR 98 | Temp 98.0°F | Resp 20 | Wt 229.9 lb

## 2014-08-28 DIAGNOSIS — L270 Generalized skin eruption due to drugs and medicaments taken internally: Secondary | ICD-10-CM | POA: Diagnosis not present

## 2014-08-28 MED ORDER — METHYLPREDNISOLONE 4 MG PO TBPK: ORAL_TABLET | ORAL | Status: DC

## 2014-08-28 NOTE — Progress Notes (Signed)
PRN visit. Patient restarted Revlimid 08/22/14, reports within 3-4 days had developed rash on back and itching on back arms and chest. Reports he feels like his left side is swollen in comparison to right side, states his left arm and leg also appear red to him. He has continued taking revlimid in spite of rash but has not taken any today.

## 2014-08-28 NOTE — Progress Notes (Signed)
Patient seen as a work-in today for possible Revlimid-induced rash.  Today, he has a macular rash on upper and lower arms and legs.  He has mild rash on upper back and upper chest.  He notes it is pruritic.  Timing of rash follows: He started Revlimid as prescribed on 7/19.  Rash began on 7/21 or 7/22.  He continued the medication through the weekend.  He reports today for rash.  He has grade 3 rash from Revlimid.  Will hold Revlimid until Friday when he returns as previsouly scheduled.  Medrol Dose Pak prescribed.  He is to take Zyrtec daily.  He can take PO Benadry at HS.  Return as scheduled.  Patient and plan discussed with Dr. Ancil Linsey and she is in agreement with the aforementioned.   KEFALAS,THOMAS 08/28/2014 11:57 AM   We have had multiple attempts and trying to get the patient to tolerate Revlimid. Rash today is quite significant was confluent on the arms and legs chest and back. If attempted every other day dosing, steroid-induced, interruptions in therapy and dose reduction. Fortunately we may have to consider discontinuing therapy. But I advised the patient to proceed with the plan as detailed above and we will reassess and make additional recommendations this upcoming Friday at his return.  I have advised him to notify us if the rash worsens over the next 24 hours. I advised him to call for any mouth lesions, sores, skin peeling or fever. Molli Hazard, MD

## 2014-08-28 NOTE — Patient Instructions (Signed)
[place] at [facility] Discharge Instructions  RECOMMENDATIONS MADE BY THE CONSULTANT AND ANY TEST RESULTS WILL BE SENT TO YOUR REFERRING PHYSICIAN.  HOLD REVLIMID. Take Medrol dose pack as directed. (prescription given to patient) Take over-the-counter Zyrtec one tablet daily. You may take over-the-counter Benadryl at bedtime if you wish. Return Friday as scheduled.  Thank you for choosing [place] at [facility] to provide your oncology and hematology care.  To afford each patient quality time with our provider, please arrive at least 15 minutes before your scheduled appointment time.    You need to re-schedule your appointment should you arrive 10 or more minutes late.  We strive to give you quality time with our providers, and arriving late affects you and other patients whose appointments are after yours.  Also, if you no show three or more times for appointments you may be dismissed from the clinic at the providers discretion.     Again, thank you for choosing [facility].  Our hope is that these requests will decrease the amount of time that you wait before being seen by our physicians.       _____________________________________________________________  Should you have questions after your visit to [facility], please contact our office at [phone] between the hours of 8:30 a.m. and 4:30 p.m.  Voicemails left after 4:30 p.m. will not be returned until the following business day.  For prescription refill requests, have your pharmacy contact our office.

## 2014-08-28 NOTE — Telephone Encounter (Signed)
A user error has taken place: encounter opened in error, closed for administrative reasons.

## 2014-08-29 ENCOUNTER — Other Ambulatory Visit (HOSPITAL_COMMUNITY): Payer: Self-pay

## 2014-09-01 ENCOUNTER — Encounter (HOSPITAL_COMMUNITY): Payer: Self-pay | Admitting: Oncology

## 2014-09-01 ENCOUNTER — Encounter (HOSPITAL_COMMUNITY): Payer: Medicare Other

## 2014-09-01 ENCOUNTER — Encounter (HOSPITAL_BASED_OUTPATIENT_CLINIC_OR_DEPARTMENT_OTHER): Payer: Medicare Other | Admitting: Oncology

## 2014-09-01 ENCOUNTER — Encounter (HOSPITAL_BASED_OUTPATIENT_CLINIC_OR_DEPARTMENT_OTHER): Payer: Medicare Other

## 2014-09-01 VITALS — BP 134/92 | HR 83 | Temp 97.9°F | Resp 18 | Wt 231.0 lb

## 2014-09-01 DIAGNOSIS — D472 Monoclonal gammopathy: Secondary | ICD-10-CM | POA: Diagnosis not present

## 2014-09-01 DIAGNOSIS — R630 Anorexia: Secondary | ICD-10-CM | POA: Diagnosis not present

## 2014-09-01 DIAGNOSIS — C9000 Multiple myeloma not having achieved remission: Secondary | ICD-10-CM

## 2014-09-01 DIAGNOSIS — G622 Polyneuropathy due to other toxic agents: Secondary | ICD-10-CM

## 2014-09-01 DIAGNOSIS — K219 Gastro-esophageal reflux disease without esophagitis: Secondary | ICD-10-CM | POA: Diagnosis not present

## 2014-09-01 DIAGNOSIS — R634 Abnormal weight loss: Secondary | ICD-10-CM | POA: Diagnosis not present

## 2014-09-01 DIAGNOSIS — M899 Disorder of bone, unspecified: Secondary | ICD-10-CM | POA: Diagnosis not present

## 2014-09-01 DIAGNOSIS — N400 Enlarged prostate without lower urinary tract symptoms: Secondary | ICD-10-CM | POA: Diagnosis not present

## 2014-09-01 LAB — COMPREHENSIVE METABOLIC PANEL
ALT: 13 U/L — ABNORMAL LOW (ref 17–63)
AST: 21 U/L (ref 15–41)
Albumin: 3.7 g/dL (ref 3.5–5.0)
Alkaline Phosphatase: 39 U/L (ref 38–126)
Anion gap: 8 (ref 5–15)
BUN: 19 mg/dL (ref 6–20)
CO2: 22 mmol/L (ref 22–32)
Calcium: 8.8 mg/dL — ABNORMAL LOW (ref 8.9–10.3)
Chloride: 110 mmol/L (ref 101–111)
Creatinine, Ser: 0.66 mg/dL (ref 0.61–1.24)
GFR calc Af Amer: >60 mL/min (ref >60)
GFR calc non Af Amer: >60 mL/min (ref >60)
Glucose, Bld: 126 mg/dL — ABNORMAL HIGH (ref 65–99)
Potassium: 3.6 mmol/L (ref 3.5–5.1)
Sodium: 140 mmol/L (ref 135–145)
Total Bilirubin: 0.4 mg/dL (ref 0.3–1.2)
Total Protein: 6.3 g/dL — ABNORMAL LOW (ref 6.5–8.1)

## 2014-09-01 LAB — CBC WITH DIFFERENTIAL/PLATELET
Basophils Absolute: 0 10*3/uL (ref 0.0–0.1)
Basophils Relative: 0 % (ref 0–1)
Eosinophils Absolute: 0 10*3/uL (ref 0.0–0.7)
Eosinophils Relative: 1 % (ref 0–5)
HCT: 44.5 % (ref 39.0–52.0)
Hemoglobin: 14.7 g/dL (ref 13.0–17.0)
Lymphocytes Relative: 14 % (ref 12–46)
Lymphs Abs: 1.1 10*3/uL (ref 0.7–4.0)
MCH: 29.8 pg (ref 26.0–34.0)
MCHC: 33 g/dL (ref 30.0–36.0)
MCV: 90.1 fL (ref 78.0–100.0)
Monocytes Absolute: 1 10*3/uL (ref 0.1–1.0)
Monocytes Relative: 12 % (ref 3–12)
Neutro Abs: 6 10*3/uL (ref 1.7–7.7)
Neutrophils Relative %: 73 % (ref 43–77)
Platelets: 150 10*3/uL (ref 150–400)
RBC: 4.94 MIL/uL (ref 4.22–5.81)
RDW: 14.4 % (ref 11.5–15.5)
WBC: 8.2 10*3/uL (ref 4.0–10.5)

## 2014-09-01 LAB — SEDIMENTATION RATE: Sed Rate: 2 mm/hr (ref 0–16)

## 2014-09-01 LAB — LACTATE DEHYDROGENASE: LDH: 241 U/L — ABNORMAL HIGH (ref 98–192)

## 2014-09-01 LAB — C-REACTIVE PROTEIN: CRP: 1 mg/dL — ABNORMAL HIGH (ref <1.0)

## 2014-09-01 MED ORDER — LENALIDOMIDE 10 MG PO CAPS: 10.0000 mg | ORAL_CAPSULE | ORAL | Status: DC

## 2014-09-01 NOTE — Patient Instructions (Signed)
[place] at [facility] Discharge Instructions  RECOMMENDATIONS MADE BY THE CONSULTANT AND ANY TEST RESULTS WILL BE SENT TO YOUR REFERRING PHYSICIAN.  Exam and discussion by Robynn Pane, PA-C. Revlimid will change to 10 mg every other day.  Call us when you start the new prescription. Zometa to be held because your calcium level is too low. Will recheck labs in 2 weeks and if your calcium level is ok we will give you the zometa. Call with any concerns.  Follow-up in 2 weeks with labs, office visit and possible zometa.  Thank you for choosing [place] at [facility] to provide your oncology and hematology care.  To afford each patient quality time with our Mystique Bjelland, please arrive at least 15 minutes before your scheduled appointment time.    You need to re-schedule your appointment should you arrive 10 or more minutes late.  We strive to give you quality time with our providers, and arriving late affects you and other patients whose appointments are after yours.  Also, if you no show three or more times for appointments you may be dismissed from the clinic at the providers discretion.     Again, thank you for choosing [facility].  Our hope is that these requests will decrease the amount of time that you wait before being seen by our physicians.       _____________________________________________________________  Should you have questions after your visit to [facility], please contact our office at [phone] between the hours of 8:30 a.m. and 4:30 p.m.  Voicemails left after 4:30 p.m. will not be returned until the following business day.  For prescription refill requests, have your pharmacy contact our office.

## 2014-09-01 NOTE — Assessment & Plan Note (Addendum)
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  PET imaging on 08/11/2014 demonstrates significant response to therapy.  Revlimid-induced rash requiring a break in therapy on 7/25 and treatment with Medrol Dose Pak, finishing in 2 days with complete resolution of rash  Oncology history updated.  Labs today: CBC diff, CMET, LDH, ESR, CRP, MM Panel, B2M  He experienced a good partial response with significant improvement in plasmacytomas on PET imaging.  He has been intolerant to Velcade due to PN and intolerant to Revlimid due to rash, despite attempts to treat through it with symptomatic management.  M-Spike remains.  He was given two options including stopping therapy and observing or trying Revlimid 10 mg QOD.  He has chosen the latter option.  He is told to hold Revlimid 25 mg at this time.  He will wait for delivery of his 10 mg Revlimid and call us when he gets this medication.  Labs in 2 weeks: CBC diff, CMET  Hypocalcemia noted.  Will hold Zometa and try again in 2 weeks.  Return in 2 weeks for follow-up.

## 2014-09-01 NOTE — Progress Notes (Signed)
Dean Fraction, MD [address] [place] [address]  Multiple myeloma without remission - Plan: lenalidomide (REVLIMID) 10 MG capsule, DISCONTINUED: lenalidomide (REVLIMID) 10 MG capsule  CURRENT THERAPY: Revlimid on hold since 7/25 due to rash  INTERVAL HISTORY: Dean [name] 79 y.o. male returns for followup of multiple myeloma, monoclonal IgG kappa with scattered osseous lesions.     Multiple myeloma without remission   11/16/2012 Initial Diagnosis L occipital condyle destructive lesion, not biopsied secondary to location. XRT   12/01/2012 Imaging L3 superior endplate compression FX, no lytic lesions to suggest myeloma   02/06/2014 Imaging soft tissue mass along the right posterior fifth rib with some rib destruction   02/16/2014 Miscellaneous Normal CBC, Normal CMP, kappa,lamda ratio of 2.62 (abnormal), UIEP with slightly restricted band, IgG kappa, SPEP/IEP with monoclonal protein at 0.79 g/dl, normal igG, suppressed IGM   02/20/2014 Bone Marrow Biopsy 33% kappa restricted plasma cells Normal FISH, normal cytogenetics   02/21/2014 - 03/08/2014 Radiation Therapy 30Gy to R rib lesion, lesion not biopsied   03/16/2014 PET scan Scattered hypermetabolic osseous lesions, including the calvarium, ribs, left scapula, sacrum, and left femoral shaft. An expansile left vertebral body lesion at L3 extends into the epidural space of the left lateral recess,    03/17/2014 - 04/07/2014 Chemotherapy Velcade and Dexamethasone due to initial denial of Revlimid by insurance company.  Zometa monthly.   03/22/2014 Imaging MR_ L-Spine- Enhancing lesions compatible with multiple myeloma at L2, L3, and S1-2.   04/07/2014 - 07/28/2014 Chemotherapy RVD with Revlimid at 25 mg days 1-14.  Revlimid dose reduced to 25 mg every other day x 14 days with 7 day respite beginning on day 1 of cycle 2.   04/17/2014 Adverse Reaction Revlimid-induced rash.  Treated with steroids.   07/28/2014 - 08/04/2014  Chemotherapy Revlimid daily   08/04/2014 Adverse Reaction Velcade-induced peripheral neuropathy   08/04/2014 Treatment Plan Change D/C Velcade   08/11/2014 PET scan Response to therapy. Improvement and resolution of foci of osseous hypermetabolism.   08/22/2014 - 08/28/2014 Chemotherapy Revlimid 25 mg days 1-21 every 28 days   08/28/2014 Adverse Reaction Revlimid-induced rash. Medication held.  Medrol dose Pak prescribed.     I personally reviewed and went over laboratory results with the patient.  The results are noted within this dictation.  Patient was previously on 25 mg of Revlimid days 1-21 every 28 days.  He developed a rash approximately 5 days into this therapy.  Revlimid was placed on hold and he was started on SoluMedrol dose Pak.  He is finishing this up in 2 days.  His rash has resolved.  He notes his PN is grade 2 and has progressed.  He is not on Velcade.  He notes that it is up to his calves.  Past Medical History  Diagnosis Date  . Neuropathy   . BPH (benign prostatic hyperplasia)   . GERD (gastroesophageal reflux disease)   . H/O ETOH abuse   . Skull lesion     Left occipital condyle  . History of radiation therapy 01/05/13-02/10/13    45 gray to left occipital condyle region  . Multiple myeloma   . Radiation 02/21/14-03/08/14    right posterior chest wall area 30 gray  . Radiation 04/19/14-05/02/14    lumbar spine 25 gray    has Neuropathy; BPH (benign prostatic hyperplasia); GERD (gastroesophageal reflux disease); H/O ETOH abuse; Skull lesion; Neck pain; Fatigue; Loss of weight; Insomnia; Allergic  rhinitis; Lumbar compression fracture; Dermatitis; and Multiple myeloma without remission on his problem list.     is allergic to codeine and morphine and related.  Current Outpatient Prescriptions on File Prior to Visit  Medication Sig Dispense Refill  . acyclovir (ZOVIRAX) 400 MG tablet Take 1 tablet (400 mg total) by mouth 2 (two) times daily. 60 tablet 5  . ALPRAZolam (XANAX) 1  MG tablet Take 1-2 tablets (1-2 mg total) by mouth at bedtime as needed for sleep. For anxiety and/or sleep 60 tablet 2  . aspirin EC 325 MG tablet Take 1 tablet (325 mg total) by mouth daily. 30 tablet 11  . Calcium Carb-Cholecalciferol (CALCIUM-VITAMIN D3) 600-500 MG-UNIT CAPS Take 1 tablet by mouth 2 (two) times daily. With food 60 capsule 6  . cetirizine (ZYRTEC) 10 MG tablet Take 10 mg by mouth daily.    . clotrimazole (LOTRIMIN) 1 % cream Apply 1 application topically 2 (two) times daily. To ear 45 g 2  . dexamethasone (DECADRON) 4 MG tablet Take 10 tablets (8m total) on Days 1, 8, & 15 of chemo. 70 tablet 1  . diphenhydrAMINE (BENADRYL) 25 MG tablet Take 25 mg by mouth at bedtime as needed for sleep.    .Marland Kitchengabapentin (NEURONTIN) 400 MG capsule Take 1 capsule (400 mg total) by mouth 4 (four) times daily. May take up to five times daily depending on pain in feet 180 capsule 5  . methylPREDNISolone (MEDROL DOSEPAK) 4 MG TBPK tablet As directed 21 tablet 0  . omeprazole (PRILOSEC) 20 MG capsule Take 1 capsule (20 mg total) by mouth daily. 30 capsule 5  . oxyCODONE-acetaminophen (PERCOCET) 10-325 MG per tablet Take 1 tablet by mouth every 4 (four) hours as needed for pain. 60 tablet 0  . tamsulosin (FLOMAX) 0.4 MG CAPS capsule TAKE 1 CAPSULE AT BEDTIME 30 capsule 3  . Diphenhyd-Hydrocort-Nystatin (FIRST-DUKES MOUTHWASH) SUSP Use as directed 5 mLs in the mouth or throat 4 (four) times daily as needed. (Patient not taking: Reported on 08/04/2014) 300 mL 1  . Doxepin HCl (SILENOR) 6 MG TABS Take 1 tablet (6 mg total) by mouth at bedtime. (Patient not taking: Reported on 09/01/2014) 30 tablet 6  . ondansetron (ZOFRAN) 8 MG tablet Take 1 tablet (8 mg total) by mouth every 8 (eight) hours as needed for nausea or vomiting. (Patient not taking: Reported on 07/14/2014) 30 tablet 2  . Zoledronic Acid (ZOMETA IV) Inject into the vein. Every 28 days     Current Facility-Administered Medications on File Prior to  Visit  Medication Dose Route Frequency Provider Last Rate Last Dose  . sodium chloride 0.9 % injection 10 mL  10 mL Intracatheter PRN TBaird Cancer PA-C   10 mL at 08/04/14 1103    Past Surgical History  Procedure Laterality Date  . Cyst excision  1959    tail bone    Denies any headaches, dizziness, double vision, fevers, chills, night sweats, nausea, vomiting, diarrhea, constipation, chest pain, heart palpitations, shortness of breath, blood in stool, black tarry stool, urinary pain, urinary burning, urinary frequency, hematuria.   PHYSICAL EXAMINATION  ECOG PERFORMANCE STATUS: 1 - Symptomatic but completely ambulatory  Filed Vitals:   09/01/14 1100  BP: 134/92  Pulse: 83  Temp: 97.9 F (36.6 C)  Resp: 18    GENERAL:alert, no distress, well nourished, well developed, comfortable, cooperative, obese and smiling SKIN: skin color, texture, turgor are normal, no rashes or significant lesions HEAD: Normocephalic, No masses, lesions, tenderness or abnormalities  EYES: EOMI EARS: External ears normal OROPHARYNX:lips, buccal mucosa, and tongue normal and mucous membranes are moist  NECK: supple, trachea midline LYMPH:  not examined BREAST:not examined LUNGS: clear to auscultation  HEART: regular rate & rhythm, no murmurs and no gallops ABDOMEN:abdomen soft and normal bowel sounds BACK: Back symmetric, no curvature. EXTREMITIES:less then 2 second capillary refill, no joint deformities, effusion, or inflammation, no skin discoloration, no cyanosis  NEURO: alert & oriented x 3 with fluent speech, no focal motor/sensory deficits, gait normal   LABORATORY DATA: CBC    Component Value Date/Time   WBC 8.2 09/01/2014 1040   RBC 4.94 09/01/2014 1040   HGB 14.7 09/01/2014 1040   HCT 44.5 09/01/2014 1040   PLT 150 09/01/2014 1040   MCV 90.1 09/01/2014 1040   MCH 29.8 09/01/2014 1040   MCHC 33.0 09/01/2014 1040   RDW 14.4 09/01/2014 1040   LYMPHSABS 1.1 09/01/2014 1040    MONOABS 1.0 09/01/2014 1040   EOSABS 0.0 09/01/2014 1040   BASOSABS 0.0 09/01/2014 1040      Chemistry      Component Value Date/Time   NA 140 09/01/2014 1040   K 3.6 09/01/2014 1040   CL 110 09/01/2014 1040   CO2 22 09/01/2014 1040   BUN 19 09/01/2014 1040   CREATININE 0.66 09/01/2014 1040   CREATININE 0.76 02/06/2014 1249      Component Value Date/Time   CALCIUM 8.8* 09/01/2014 1040   ALKPHOS 39 09/01/2014 1040   AST 21 09/01/2014 1040   ALT 13* 09/01/2014 1040   BILITOT 0.4 09/01/2014 1040     Lab Results  Component Value Date   PROT 6.3* 09/01/2014   ALBUMINELP 52.2* 04/07/2014   A1GS 6.7* 04/07/2014   A2GS 15.4* 04/07/2014   BETS 6.0 04/07/2014   BETA2SER 5.3 04/07/2014   GAMS 14.4 04/07/2014   MSPIKE 0.45 04/07/2014   SPEI (NOTE) 04/07/2014   SPECOM (NOTE) 04/07/2014   IGGSERUM 406* 07/28/2014   IGA 76 07/28/2014   IGMSERUM 19 07/28/2014   IMMELINT (NOTE) 04/07/2014   KPAFRELGTCHN 33.96* 07/28/2014   LAMBDASER 13.07 07/28/2014   KAPLAMBRATIO 2.60* 07/28/2014    PENDING LABS:   RADIOGRAPHIC STUDIES:  Nm Pet Image Restage (ps) Whole Body  08/11/2014   CLINICAL DATA:  Subsequent treatment strategy for multiple myeloma. Ethanol abuse. Radiation therapy, including through 05/02/2014.  EXAM: NUCLEAR MEDICINE PET WHOLE BODY  TECHNIQUE: 11.4 mCi F-18 FDG was injected intravenously. Full-ring PET imaging was performed from the vertex to the feet after the radiotracer. CT data was obtained and used for attenuation correction and anatomic localization.  FASTING BLOOD GLUCOSE:  Value:  91 mg/dl  COMPARISON:  03/16/2014  FINDINGS: Head/Neck: No areas of abnormal hypermetabolism.  Chest: New posterior right upper lobe consolidation and hypermetabolism. This is adjacent to the previously described fifth posterior right rib lytic lesion and favored to be radiation induced. This measures a S.U.V. max of 7.8. No thoracic nodal hypermetabolism.  Abdomen/Pelvis: No areas of  abnormal hypermetabolism.  Skeleton: Improvement to resolution of multifocal hypermetabolic osseous metastasis. For example, resolution of the L3 hypermetabolism. Marked improvement in right sacral hypermetabolism. This currently measures a S.U.V. max of 2.5. On the prior exam this measured a S.U.V. max of 8.1. The right fifth rib lesion is not significantly hypermetabolic and has undergone interval healing, including on image 92 of series 4.  Extremities: Resolution of previously described left femoral shaft hypermetabolism.  CT images performed for attenuation correction: No cervical adenopathy. Right worse left  carotid atherosclerosis. Subtle right frontal calvarial lesion is less well-defined today, including on image 27 of series 4. Mild cardiomegaly. Multivessel coronary artery atherosclerosis. Small hiatal hernia. Small right pleural effusion is new. Mildly prominent caudate lobe. Normal adrenal glands. Probable right renal vascular calcification. Left renal fluid density lesion is likely a cyst. There also left renal sinus cysts. Abdominal aortic and branch vessel atherosclerosis. Extensive colonic diverticulosis. Moderate prostatomegaly. Similar mild L4 compression deformity.  IMPRESSION: 1. Response to therapy. Improvement and resolution of foci of osseous hypermetabolism. 2. New posterior right upper lobe hypermetabolic consolidation, likely radiation induced. 3. No sites of progressive disease identified. 4. Advanced atherosclerosis, including within the coronary arteries. 5. New small right pleural effusion. 6. Mild caudate lobe prominence, which given the history of ethanol abuse could represent early cirrhosis.   Electronically Signed   By: Abigail Miyamoto M.D.   On: 08/11/2014 12:30     PATHOLOGY:    ASSESSMENT AND PLAN:  Multiple myeloma without remission Multiple myeloma, monoclonal IgG kappa with scattered osseous lesions.  PET imaging on 08/11/2014 demonstrates significant response to  therapy.  Revlimid-induced rash requiring a break in therapy on 7/25 and treatment with Medrol Dose Pak, finishing in 2 days with complete resolution of rash  Oncology history updated.  Labs today: CBC diff, CMET, LDH, ESR, CRP, MM Panel, B2M  He experienced a good partial response with significant improvement in plasmacytomas on PET imaging.  He has been intolerant to Velcade due to PN and intolerant to Revlimid due to rash, despite attempts to treat through it with symptomatic management.  M-Spike remains.  He was given two options including stopping therapy and observing or trying Revlimid 10 mg QOD.  He has chosen the latter option.  He is told to hold Revlimid 25 mg at this time.  He will wait for delivery of his 10 mg Revlimid and call us when he gets this medication.  Labs in 2 weeks: CBC diff, CMET  Hypocalcemia noted.  Will hold Zometa and try again in 2 weeks.  Return in 2 weeks for follow-up.     THERAPY PLAN:  Will change treatment to Revlimid 10 mg every other day.  If tolerated, will consider increasing to 10 mg daily.  We will need to work with with his Dexamethasone dosing as he has not been taking it.  He may benefit from splitting Dexamethasone dosing if Revlimid is tolerated.  All questions were answered. The patient knows to call the clinic with any problems, questions or concerns. We can certainly see the patient much sooner if necessary.  Patient and plan discussed with Dr. Ancil Linsey and she is in agreement with the aforementioned.   This note is electronically signed by: Doy Mince 09/01/2014 12:17 PM

## 2014-09-01 NOTE — Progress Notes (Signed)
LABS DRAWN

## 2014-09-02 LAB — BETA 2 MICROGLOBULIN, SERUM: Beta-2 Microglobulin: 1.8 mg/L (ref 0.6–2.4)

## 2014-09-03 LAB — KAPPA/LAMBDA LIGHT CHAINS
Kappa free light chain: 21.53 mg/L — ABNORMAL HIGH (ref 3.30–19.40)
Kappa, lambda light chain ratio: 1.81 — ABNORMAL HIGH (ref 0.26–1.65)
Lambda free light chains: 11.87 mg/L (ref 5.71–26.30)

## 2014-09-05 ENCOUNTER — Other Ambulatory Visit (HOSPITAL_COMMUNITY): Payer: Self-pay

## 2014-09-05 LAB — MULTIPLE MYELOMA PANEL, SERUM
Albumin SerPl Elph-Mcnc: 3.2 g/dL (ref 2.9–4.4)
Albumin/Glob SerPl: 1.3 (ref 0.7–1.7)
Alpha 1: 0.2 g/dL (ref 0.0–0.4)
Alpha2 Glob SerPl Elph-Mcnc: 0.8 g/dL (ref 0.4–1.0)
B-Globulin SerPl Elph-Mcnc: 0.9 g/dL (ref 0.7–1.3)
Gamma Glob SerPl Elph-Mcnc: 0.5 g/dL (ref 0.4–1.8)
Globulin, Total: 2.5 g/dL (ref 2.2–3.9)
IgA: 103 mg/dL (ref 61–437)
IgG (Immunoglobin G), Serum: 569 mg/dL — ABNORMAL LOW (ref 700–1600)
IgM, Serum: 21 mg/dL (ref 15–143)
M Protein SerPl Elph-Mcnc: 0.2 g/dL — ABNORMAL HIGH
Total Protein ELP: 5.7 g/dL — ABNORMAL LOW (ref 6.0–8.5)

## 2014-09-08 ENCOUNTER — Encounter (HOSPITAL_COMMUNITY): Payer: Self-pay | Admitting: Oncology

## 2014-09-15 ENCOUNTER — Encounter (HOSPITAL_BASED_OUTPATIENT_CLINIC_OR_DEPARTMENT_OTHER): Payer: Medicare Other

## 2014-09-15 ENCOUNTER — Encounter (HOSPITAL_COMMUNITY): Payer: Medicare Other | Attending: Hematology and Oncology | Admitting: Oncology

## 2014-09-15 VITALS — BP 131/55 | HR 75 | Temp 97.8°F | Resp 20 | Wt 231.4 lb

## 2014-09-15 VITALS — BP 127/63 | HR 77 | Temp 97.7°F | Resp 18

## 2014-09-15 DIAGNOSIS — R634 Abnormal weight loss: Secondary | ICD-10-CM | POA: Diagnosis not present

## 2014-09-15 DIAGNOSIS — C9000 Multiple myeloma not having achieved remission: Secondary | ICD-10-CM

## 2014-09-15 DIAGNOSIS — D472 Monoclonal gammopathy: Secondary | ICD-10-CM | POA: Diagnosis not present

## 2014-09-15 DIAGNOSIS — Z923 Personal history of irradiation: Secondary | ICD-10-CM | POA: Diagnosis not present

## 2014-09-15 DIAGNOSIS — K219 Gastro-esophageal reflux disease without esophagitis: Secondary | ICD-10-CM | POA: Insufficient documentation

## 2014-09-15 DIAGNOSIS — M899 Disorder of bone, unspecified: Secondary | ICD-10-CM | POA: Insufficient documentation

## 2014-09-15 DIAGNOSIS — R630 Anorexia: Secondary | ICD-10-CM | POA: Diagnosis not present

## 2014-09-15 DIAGNOSIS — N400 Enlarged prostate without lower urinary tract symptoms: Secondary | ICD-10-CM | POA: Insufficient documentation

## 2014-09-15 DIAGNOSIS — M542 Cervicalgia: Secondary | ICD-10-CM | POA: Insufficient documentation

## 2014-09-15 LAB — COMPREHENSIVE METABOLIC PANEL
ALT: 12 U/L — ABNORMAL LOW (ref 17–63)
AST: 17 U/L (ref 15–41)
Albumin: 3.3 g/dL — ABNORMAL LOW (ref 3.5–5.0)
Alkaline Phosphatase: 31 U/L — ABNORMAL LOW (ref 38–126)
Anion gap: 7 (ref 5–15)
BUN: 22 mg/dL — ABNORMAL HIGH (ref 6–20)
CO2: 26 mmol/L (ref 22–32)
Calcium: 8.7 mg/dL — ABNORMAL LOW (ref 8.9–10.3)
Chloride: 108 mmol/L (ref 101–111)
Creatinine, Ser: 0.73 mg/dL (ref 0.61–1.24)
GFR calc Af Amer: >60 mL/min (ref >60)
GFR calc non Af Amer: >60 mL/min (ref >60)
Glucose, Bld: 93 mg/dL (ref 65–99)
Potassium: 4.3 mmol/L (ref 3.5–5.1)
Sodium: 141 mmol/L (ref 135–145)
Total Bilirubin: 0.4 mg/dL (ref 0.3–1.2)
Total Protein: 6.1 g/dL — ABNORMAL LOW (ref 6.5–8.1)

## 2014-09-15 MED ORDER — SODIUM CHLORIDE 0.9 % IV SOLN
Freq: Once | INTRAVENOUS | Status: AC
  Administered 2014-09-15: 11:00:00 via INTRAVENOUS

## 2014-09-15 MED ORDER — ZOLEDRONIC ACID 4 MG/5ML IV CONC
4.0000 mg | Freq: Once | INTRAVENOUS | Status: AC
  Administered 2014-09-15: 4 mg via INTRAVENOUS
  Filled 2014-09-15: qty 5

## 2014-09-15 MED ORDER — SODIUM CHLORIDE 0.9 % IJ SOLN
10.0000 mL | INTRAMUSCULAR | Status: DC | PRN
  Administered 2014-09-15: 10 mL
  Filled 2014-09-15: qty 10

## 2014-09-15 NOTE — Progress Notes (Unsigned)
Labs performed today.   

## 2014-09-15 NOTE — Progress Notes (Signed)
[name], MD [address] [place] [address]  Multiple myeloma without remission - Plan: CBC with Differential, CBC with Differential, Comprehensive metabolic panel, Lactate dehydrogenase, Sedimentation rate, Beta 2 microglobuline, serum, C-reactive protein, Multiple myeloma panel, serum, Kappa/lambda light chains  CURRENT THERAPY: Revlimid 10 mg QOD  INTERVAL HISTORY: Dean [name] 79 y.o. male returns for followup of multiple myeloma, monoclonal IgG kappa with scattered osseous lesions.     Multiple myeloma without remission   11/16/2012 Initial Diagnosis L occipital condyle destructive lesion, not biopsied secondary to location. XRT   12/01/2012 Imaging L3 superior endplate compression FX, no lytic lesions to suggest myeloma   02/06/2014 Imaging soft tissue mass along the right posterior fifth rib with some rib destruction   02/16/2014 Miscellaneous Normal CBC, Normal CMP, kappa,lamda ratio of 2.62 (abnormal), UIEP with slightly restricted band, IgG kappa, SPEP/IEP with monoclonal protein at 0.79 g/dl, normal igG, suppressed IGM   02/20/2014 Bone Marrow Biopsy 33% kappa restricted plasma cells Normal FISH, normal cytogenetics   02/21/2014 - 03/08/2014 Radiation Therapy 30Gy to R rib lesion, lesion not biopsied   03/16/2014 PET scan Scattered hypermetabolic osseous lesions, including the calvarium, ribs, left scapula, sacrum, and left femoral shaft. An expansile left vertebral body lesion at L3 extends into the epidural space of the left lateral recess,    03/17/2014 - 04/07/2014 Chemotherapy Velcade and Dexamethasone due to initial denial of Revlimid by insurance company.  Zometa monthly.   03/22/2014 Imaging MR_ L-Spine- Enhancing lesions compatible with multiple myeloma at L2, L3, and S1-2.   04/07/2014 - 07/28/2014 Chemotherapy RVD with Revlimid at 25 mg days 1-14.  Revlimid dose reduced to 25 mg every other day x 14 days with 7 day respite beginning on day 1 of  cycle 2.   04/17/2014 Adverse Reaction Revlimid-induced rash.  Treated with steroids.   07/28/2014 - 08/04/2014 Chemotherapy Revlimid daily   08/04/2014 Adverse Reaction Velcade-induced peripheral neuropathy   08/04/2014 Treatment Plan Change D/C Velcade   08/11/2014 PET scan Response to therapy. Improvement and resolution of foci of osseous hypermetabolism.   08/22/2014 - 08/28/2014 Chemotherapy Revlimid 25 mg days 1-21 every 28 days   08/28/2014 Adverse Reaction Revlimid-induced rash. Medication held.  Medrol dose Pak prescribed.   09/04/2014 -  Chemotherapy Revlimid 10 mg every other day, without dexamethasone     I personally reviewed and went over laboratory results with the patient.  The results are noted within this dictation.    He is tolerating Revlimid 10 mg every other day without complaints.  He does note that his PN is improved some.    He still is not taking Dexamethasone weekly because he did not like the way it made him feel.  We will talk about this further in the future.  Zometa was held 2 weeks ago for hypocalcemia.  Past Medical History  Diagnosis Date  . Neuropathy   . BPH (benign prostatic hyperplasia)   . GERD (gastroesophageal reflux disease)   . H/O ETOH abuse   . Skull lesion     Left occipital condyle  . History of radiation therapy 01/05/13-02/10/13    45 gray to left occipital condyle region  . Multiple myeloma   . Radiation 02/21/14-03/08/14    right posterior chest wall area 30 gray  . Radiation 04/19/14-05/02/14    lumbar spine 25 gray    has Neuropathy; BPH (benign prostatic hyperplasia); GERD (gastroesophageal reflux disease); H/O ETOH abuse; Skull  lesion; Neck pain; Fatigue; Loss of weight; Insomnia; Allergic rhinitis; Lumbar compression fracture; Dermatitis; and Multiple myeloma without remission on his problem list.     is allergic to codeine and morphine and related.  Current Outpatient Prescriptions on File Prior to Visit  Medication Sig Dispense Refill  .  acyclovir (ZOVIRAX) 400 MG tablet Take 1 tablet (400 mg total) by mouth 2 (two) times daily. 60 tablet 5  . ALPRAZolam (XANAX) 1 MG tablet Take 1-2 tablets (1-2 mg total) by mouth at bedtime as needed for sleep. For anxiety and/or sleep 60 tablet 2  . aspirin EC 325 MG tablet Take 1 tablet (325 mg total) by mouth daily. 30 tablet 11  . Calcium Carb-Cholecalciferol (CALCIUM-VITAMIN D3) 600-500 MG-UNIT CAPS Take 1 tablet by mouth 2 (two) times daily. With food 60 capsule 6  . clotrimazole (LOTRIMIN) 1 % cream Apply 1 application topically 2 (two) times daily. To ear 45 g 2  . dexamethasone (DECADRON) 4 MG tablet Take 10 tablets (50m total) on Days 1, 8, & 15 of chemo. 70 tablet 1  . diphenhydrAMINE (BENADRYL) 25 MG tablet Take 25 mg by mouth at bedtime as needed for sleep.    .Marland Kitchengabapentin (NEURONTIN) 400 MG capsule Take 1 capsule (400 mg total) by mouth 4 (four) times daily. May take up to five times daily depending on pain in feet 180 capsule 5  . lenalidomide (REVLIMID) 10 MG capsule Take 1 capsule (10 mg total) by mouth every other day. 14 capsule 0  . omeprazole (PRILOSEC) 20 MG capsule Take 1 capsule (20 mg total) by mouth daily. 30 capsule 5  . ondansetron (ZOFRAN) 8 MG tablet Take 1 tablet (8 mg total) by mouth every 8 (eight) hours as needed for nausea or vomiting. 30 tablet 2  . oxyCODONE-acetaminophen (PERCOCET) 10-325 MG per tablet Take 1 tablet by mouth every 4 (four) hours as needed for pain. 60 tablet 0  . tamsulosin (FLOMAX) 0.4 MG CAPS capsule TAKE 1 CAPSULE AT BEDTIME 30 capsule 3  . Zoledronic Acid (ZOMETA IV) Inject into the vein. Every 28 days    . cetirizine (ZYRTEC) 10 MG tablet Take 10 mg by mouth daily.    . Diphenhyd-Hydrocort-Nystatin (FIRST-DUKES MOUTHWASH) SUSP Use as directed 5 mLs in the mouth or throat 4 (four) times daily as needed. (Patient not taking: Reported on 08/04/2014) 300 mL 1  . Doxepin HCl (SILENOR) 6 MG TABS Take 1 tablet (6 mg total) by mouth at bedtime.  (Patient not taking: Reported on 09/01/2014) 30 tablet 6   Current Facility-Administered Medications on File Prior to Visit  Medication Dose Route Frequency Provider Last Rate Last Dose  . sodium chloride 0.9 % injection 10 mL  10 mL Intracatheter PRN TBaird Cancer PA-C   10 mL at 08/04/14 1103    Past Surgical History  Procedure Laterality Date  . Cyst excision  1959    tail bone    Denies any headaches, dizziness, double vision, fevers, chills, night sweats, nausea, vomiting, diarrhea, constipation, chest pain, heart palpitations, shortness of breath, blood in stool, black tarry stool, urinary pain, urinary burning, urinary frequency, hematuria.   PHYSICAL EXAMINATION  ECOG PERFORMANCE STATUS: 1 - Symptomatic but completely ambulatory  Filed Vitals:   09/15/14 0952  BP: 131/55  Pulse: 75  Temp: 97.8 F (36.6 C)  Resp: 20    GENERAL:alert, no distress, well nourished, well developed, comfortable, cooperative, obese and smiling SKIN: skin color, texture, turgor are normal, no rashes or  significant lesions HEAD: Normocephalic, No masses, lesions, tenderness or abnormalities EYES: EOMI EARS: External ears normal OROPHARYNX:lips, buccal mucosa, and tongue normal and mucous membranes are moist  NECK: supple, trachea midline LYMPH:  not examined BREAST:not examined LUNGS: clear to auscultation, normal percussion.  HEART: regular rate & rhythm, no murmurs and no gallops ABDOMEN:abdomen soft and normal bowel sounds BACK: Back symmetric, no curvature. EXTREMITIES:less then 2 second capillary refill, no joint deformities, effusion, or inflammation, no skin discoloration, no cyanosis  NEURO: alert & oriented x 3 with fluent speech, no focal motor/sensory deficits, gait normal   LABORATORY DATA: CBC    Component Value Date/Time   WBC 8.2 09/01/2014 1040   RBC 4.94 09/01/2014 1040   HGB 14.7 09/01/2014 1040   HCT 44.5 09/01/2014 1040   PLT 150 09/01/2014 1040   MCV 90.1  09/01/2014 1040   MCH 29.8 09/01/2014 1040   MCHC 33.0 09/01/2014 1040   RDW 14.4 09/01/2014 1040   LYMPHSABS 1.1 09/01/2014 1040   MONOABS 1.0 09/01/2014 1040   EOSABS 0.0 09/01/2014 1040   BASOSABS 0.0 09/01/2014 1040      Chemistry      Component Value Date/Time   NA 141 09/15/2014 1005   K 4.3 09/15/2014 1005   CL 108 09/15/2014 1005   CO2 26 09/15/2014 1005   BUN 22* 09/15/2014 1005   CREATININE 0.73 09/15/2014 1005   CREATININE 0.76 02/06/2014 1249      Component Value Date/Time   CALCIUM 8.7* 09/15/2014 1005   ALKPHOS 31* 09/15/2014 1005   AST 17 09/15/2014 1005   ALT 12* 09/15/2014 1005   BILITOT 0.4 09/15/2014 1005     Lab Results  Component Value Date   PROT 6.1* 09/15/2014   ALBUMINELP 52.2* 04/07/2014   A1GS 6.7* 04/07/2014   A2GS 15.4* 04/07/2014   BETS 6.0 04/07/2014   BETA2SER 5.3 04/07/2014   GAMS 14.4 04/07/2014   MSPIKE 0.45 04/07/2014   SPEI (NOTE) 04/07/2014   SPECOM (NOTE) 04/07/2014   IGGSERUM 569* 09/01/2014   IGA 103 09/01/2014   IGMSERUM 21 09/01/2014   IMMELINT (NOTE) 04/07/2014   KPAFRELGTCHN 21.53* 09/01/2014   LAMBDASER 11.87 09/01/2014   KAPLAMBRATIO 1.81* 09/01/2014    PENDING LABS:   RADIOGRAPHIC STUDIES:  No results found.   PATHOLOGY:    ASSESSMENT AND PLAN:  Multiple myeloma without remission Multiple myeloma, monoclonal IgG kappa with scattered osseous lesions.  PET imaging on 08/11/2014 demonstrates significant response to therapy.  Revlimid-induced 25 mg daily rash requiring a break in therapy on 7/25 and treatment with Medrol Dose Pak complete resolution of rash  Oncology history updated.  He reports that he started Revlimid 10 mg every other day on 09/04/2014.  He is not taking Dexamethasone at this time.  We will work on this.  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  He has been intolerant to Velcade due to PN and intolerant to Revlimid due to rash,  despite attempts to treat through it with symptomatic management.  M-Spike remains.  He was given two options including stopping therapy and observing or trying Revlimid 10 mg QOD.  He has chosen the latter option.    Labs today: CBC diff, CMET  Hypocalcemia noted 2 weeks ago resulting in holding Zometa.  Repeat calcium today demonstrates a corrected calcium WNL.  Will give IV Zometa today.  Labs in 2 weeks: CBC diff, CMET, LDH, ESR, CRP, MM Panel, B2M  Return in 2  weeks for follow-up.  If still doing well, will consider starting Dexamethasone with split dosing.  If that is tolerated, will try to push Revlimid dose further as this may prevent Revlimid-induced rash.     THERAPY PLAN:  Continue Revlimid 10 mg QOD.  If tolerated, will add Dexamethasone in a split-dosing fashion.  If this is tolerated, will need to consider increasing Revlimid dose as the addition of Dexamethasone may prevent Revlimid-induced rash.  All questions were answered. The patient knows to call the clinic with any problems, questions or concerns. We can certainly see the patient much sooner if necessary.  Patient and plan discussed with Dr. Ancil Linsey and she is in agreement with the aforementioned.   This note is electronically signed by: Doy Mince 09/15/2014 10:55 AM

## 2014-09-15 NOTE — Patient Instructions (Signed)
..  [place] at [facility] Discharge Instructions  RECOMMENDATIONS MADE BY THE CONSULTANT AND ANY TEST RESULTS WILL BE SENT TO YOUR REFERRING PHYSICIAN.  You will receive Zometa today. Continue Revlimid every other day as ordered.   Return in two weeks for labs and for follow up appointment.      Thank you for choosing [place] at [facility] to provide your oncology and hematology care.  To afford each patient quality time with our provider, please arrive at least 15 minutes before your scheduled appointment time.    You need to re-schedule your appointment should you arrive 10 or more minutes late.  We strive to give you quality time with our providers, and arriving late affects you and other patients whose appointments are after yours.  Also, if you no show three or more times for appointments you may be dismissed from the clinic at the providers discretion.     Again, thank you for choosing [facility].  Our hope is that these requests will decrease the amount of time that you wait before being seen by our physicians.       _____________________________________________________________  Should you have questions after your visit to [facility], please contact our office at [phone] between the hours of 8:30 a.m. and 4:30 p.m.  Voicemails left after 4:30 p.m. will not be returned until the following business day.  For prescription refill requests, have your pharmacy contact our office.

## 2014-09-15 NOTE — Assessment & Plan Note (Addendum)
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  PET imaging on 08/11/2014 demonstrates significant response to therapy.  Revlimid-induced 25 mg daily rash requiring a break in therapy on 7/25 and treatment with Medrol Dose Pak complete resolution of rash  Oncology history updated.  He reports that he started Revlimid 10 mg every other day on 09/04/2014.  He is not taking Dexamethasone at this time.  We will work on this.  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  He has been intolerant to Velcade due to PN and intolerant to Revlimid due to rash, despite attempts to treat through it with symptomatic management.  M-Spike remains.  He was given two options including stopping therapy and observing or trying Revlimid 10 mg QOD.  He has chosen the latter option.    Labs today: CBC diff, CMET  Hypocalcemia noted 2 weeks ago resulting in holding Zometa.  Repeat calcium today demonstrates a corrected calcium WNL.  Will give IV Zometa today.  Labs in 2 weeks: CBC diff, CMET, LDH, ESR, CRP, MM Panel, B2M  Return in 2 weeks for follow-up.  If still doing well, will consider starting Dexamethasone with split dosing.  If that is tolerated, will try to push Revlimid dose further as this may prevent Revlimid-induced rash.

## 2014-09-15 NOTE — Progress Notes (Signed)
Tolerated zometa infusion well.

## 2014-10-02 ENCOUNTER — Other Ambulatory Visit (HOSPITAL_COMMUNITY): Payer: Self-pay | Admitting: Oncology

## 2014-10-02 ENCOUNTER — Encounter (HOSPITAL_BASED_OUTPATIENT_CLINIC_OR_DEPARTMENT_OTHER): Payer: Medicare Other

## 2014-10-02 ENCOUNTER — Encounter (HOSPITAL_COMMUNITY): Payer: Self-pay | Admitting: Oncology

## 2014-10-02 ENCOUNTER — Encounter (HOSPITAL_BASED_OUTPATIENT_CLINIC_OR_DEPARTMENT_OTHER): Payer: Medicare Other | Admitting: Oncology

## 2014-10-02 VITALS — BP 144/56 | HR 71 | Temp 97.6°F | Resp 18 | Wt 228.0 lb

## 2014-10-02 DIAGNOSIS — C9000 Multiple myeloma not having achieved remission: Secondary | ICD-10-CM | POA: Diagnosis not present

## 2014-10-02 DIAGNOSIS — R634 Abnormal weight loss: Secondary | ICD-10-CM | POA: Diagnosis not present

## 2014-10-02 DIAGNOSIS — M545 Low back pain: Secondary | ICD-10-CM | POA: Diagnosis not present

## 2014-10-02 DIAGNOSIS — N400 Enlarged prostate without lower urinary tract symptoms: Secondary | ICD-10-CM | POA: Diagnosis not present

## 2014-10-02 DIAGNOSIS — R630 Anorexia: Secondary | ICD-10-CM | POA: Diagnosis not present

## 2014-10-02 DIAGNOSIS — G629 Polyneuropathy, unspecified: Secondary | ICD-10-CM | POA: Diagnosis not present

## 2014-10-02 DIAGNOSIS — K219 Gastro-esophageal reflux disease without esophagitis: Secondary | ICD-10-CM | POA: Diagnosis not present

## 2014-10-02 DIAGNOSIS — M899 Disorder of bone, unspecified: Secondary | ICD-10-CM | POA: Diagnosis not present

## 2014-10-02 DIAGNOSIS — D472 Monoclonal gammopathy: Secondary | ICD-10-CM | POA: Diagnosis not present

## 2014-10-02 LAB — CBC WITH DIFFERENTIAL/PLATELET
Basophils Absolute: 0 10*3/uL (ref 0.0–0.1)
Basophils Relative: 0 % (ref 0–1)
Eosinophils Absolute: 0.1 10*3/uL (ref 0.0–0.7)
Eosinophils Relative: 2 % (ref 0–5)
HCT: 43.3 % (ref 39.0–52.0)
Hemoglobin: 14.6 g/dL (ref 13.0–17.0)
Lymphocytes Relative: 20 % (ref 12–46)
Lymphs Abs: 0.9 10*3/uL (ref 0.7–4.0)
MCH: 30 pg (ref 26.0–34.0)
MCHC: 33.7 g/dL (ref 30.0–36.0)
MCV: 88.9 fL (ref 78.0–100.0)
Monocytes Absolute: 0.7 10*3/uL (ref 0.1–1.0)
Monocytes Relative: 17 % — ABNORMAL HIGH (ref 3–12)
Neutro Abs: 2.6 10*3/uL (ref 1.7–7.7)
Neutrophils Relative %: 61 % (ref 43–77)
Platelets: 127 10*3/uL — ABNORMAL LOW (ref 150–400)
RBC: 4.87 MIL/uL (ref 4.22–5.81)
RDW: 14.5 % (ref 11.5–15.5)
WBC: 4.4 10*3/uL (ref 4.0–10.5)

## 2014-10-02 LAB — COMPREHENSIVE METABOLIC PANEL
ALT: 12 U/L — ABNORMAL LOW (ref 17–63)
AST: 18 U/L (ref 15–41)
Albumin: 3.5 g/dL (ref 3.5–5.0)
Alkaline Phosphatase: 26 U/L — ABNORMAL LOW (ref 38–126)
Anion gap: 6 (ref 5–15)
BUN: 16 mg/dL (ref 6–20)
CO2: 25 mmol/L (ref 22–32)
Calcium: 8.4 mg/dL — ABNORMAL LOW (ref 8.9–10.3)
Chloride: 111 mmol/L (ref 101–111)
Creatinine, Ser: 0.62 mg/dL (ref 0.61–1.24)
GFR calc Af Amer: >60 mL/min (ref >60)
GFR calc non Af Amer: >60 mL/min (ref >60)
Glucose, Bld: 108 mg/dL — ABNORMAL HIGH (ref 65–99)
Potassium: 3.7 mmol/L (ref 3.5–5.1)
Sodium: 142 mmol/L (ref 135–145)
Total Bilirubin: 0.4 mg/dL (ref 0.3–1.2)
Total Protein: 6.3 g/dL — ABNORMAL LOW (ref 6.5–8.1)

## 2014-10-02 LAB — C-REACTIVE PROTEIN: CRP: 1 mg/dL — ABNORMAL HIGH (ref <1.0)

## 2014-10-02 LAB — LACTATE DEHYDROGENASE: LDH: 125 U/L (ref 98–192)

## 2014-10-02 LAB — SEDIMENTATION RATE: Sed Rate: 10 mm/hr (ref 0–16)

## 2014-10-02 MED ORDER — LENALIDOMIDE 10 MG PO CAPS: 10.0000 mg | ORAL_CAPSULE | ORAL | Status: DC

## 2014-10-02 MED ORDER — GABAPENTIN 400 MG PO CAPS: 400.0000 mg | ORAL_CAPSULE | Freq: Four times a day (QID) | ORAL | Status: DC

## 2014-10-02 MED ORDER — OXYCODONE-ACETAMINOPHEN 10-325 MG PO TABS
1.0000 | ORAL_TABLET | ORAL | Status: DC | PRN
Start: 2014-10-02 — End: 2014-10-27

## 2014-10-02 MED ORDER — TAMSULOSIN HCL 0.4 MG PO CAPS: 0.4000 mg | ORAL_CAPSULE | Freq: Every day | ORAL | Status: DC

## 2014-10-02 NOTE — Patient Instructions (Signed)
..  [place] at [facility] Discharge Instructions  RECOMMENDATIONS MADE BY THE CONSULTANT AND ANY TEST RESULTS WILL BE SENT TO YOUR REFERRING PHYSICIAN.  Labs in 2 weeks  Continue revlimid as prescribed  [name] escribed the gabapentin and flomax  You were given oxycodone prescription  Thank you for choosing [place] at [facility] to provide your oncology and hematology care.  To afford each patient quality time with our provider, please arrive at least 15 minutes before your scheduled appointment time.    You need to re-schedule your appointment should you arrive 10 or more minutes late.  We strive to give you quality time with our providers, and arriving late affects you and other patients whose appointments are after yours.  Also, if you no show three or more times for appointments you may be dismissed from the clinic at the providers discretion.     Again, thank you for choosing [facility].  Our hope is that these requests will decrease the amount of time that you wait before being seen by our physicians.       _____________________________________________________________  Should you have questions after your visit to [facility], please contact our office at [phone] between the hours of 8:30 a.m. and 4:30 p.m.  Voicemails left after 4:30 p.m. will not be returned until the following business day.  For prescription refill requests, have your pharmacy contact our office.

## 2014-10-02 NOTE — Progress Notes (Signed)
Dean Fraction, MD [place] [address]  Multiple myeloma without remission - Plan: gabapentin (NEURONTIN) 400 MG capsule  Low back pain without sciatica, unspecified back pain laterality - Plan: oxyCODONE-acetaminophen (PERCOCET) 10-325 MG per tablet  Neuropathy - Plan: gabapentin (NEURONTIN) 400 MG capsule  CURRENT THERAPY: Revlimid 10 mg QOD  INTERVAL HISTORY: Dean [name] 79 y.o. male returns for followup of multiple myeloma, monoclonal IgG kappa with scattered osseous lesions.     Multiple myeloma without remission   11/16/2012 Initial Diagnosis L occipital condyle destructive lesion, not biopsied secondary to location. XRT   12/01/2012 Imaging L3 superior endplate compression FX, no lytic lesions to suggest myeloma   02/06/2014 Imaging soft tissue mass along the right posterior fifth rib with some rib destruction   02/16/2014 Miscellaneous Normal CBC, Normal CMP, kappa,lamda ratio of 2.62 (abnormal), UIEP with slightly restricted band, IgG kappa, SPEP/IEP with monoclonal protein at 0.79 g/dl, normal igG, suppressed IGM   02/20/2014 Bone Marrow Biopsy 33% kappa restricted plasma cells Normal FISH, normal cytogenetics   02/21/2014 - 03/08/2014 Radiation Therapy 30Gy to R rib lesion, lesion not biopsied   03/16/2014 PET scan Scattered hypermetabolic osseous lesions, including the calvarium, ribs, left scapula, sacrum, and left femoral shaft. An expansile left vertebral body lesion at L3 extends into the epidural space of the left lateral recess,    03/17/2014 - 04/07/2014 Chemotherapy Velcade and Dexamethasone due to initial denial of Revlimid by insurance company.  Zometa monthly.   03/22/2014 Imaging MR_ L-Spine- Enhancing lesions compatible with multiple myeloma at L2, L3, and S1-2.   04/07/2014 - 07/28/2014 Chemotherapy RVD with Revlimid at 25 mg days 1-14.  Revlimid dose reduced to 25 mg every other day x 14 days with 7 day respite beginning on day 1  of cycle 2.   04/17/2014 Adverse Reaction Revlimid-induced rash.  Treated with steroids.   07/28/2014 - 08/04/2014 Chemotherapy Revlimid daily   08/04/2014 Adverse Reaction Velcade-induced peripheral neuropathy   08/04/2014 Treatment Plan Change D/C Velcade   08/11/2014 PET scan Response to therapy. Improvement and resolution of foci of osseous hypermetabolism.   08/22/2014 - 08/28/2014 Chemotherapy Revlimid 25 mg days 1-21 every 28 days   08/28/2014 Adverse Reaction Revlimid-induced rash. Medication held.  Medrol dose Pak prescribed.   09/04/2014 -  Chemotherapy Revlimid 10 mg every other day, without dexamethasone     I personally reviewed and went over laboratory results with the patient.  The results are noted within this dictation.    He is tolerating Revlimid 10 mg every other day without complaints.  He does note that his PN is improved some.    He still is not taking Dexamethasone weekly because he did not like the way it made him feel.  We will talk about this further in the future.  He notes that he discontinued his Gabapentin in the past on his own.  Now that he has restarted the medication, he notes that his PN is improved.  He believes that a major player in his progressive PN was his stopping of this medication.  Otherwise, he notes that his neck is tender, bilateral paraspinal.  He denies any radicular symptoms.  He reports that his [facility] reported severe neck arthritis and this is the culprit of his pain.  MRI of C-spine in 2015 demonstrates degenerative disease that was stable since 2014.  Past Medical History  Diagnosis Date  . Neuropathy   .  BPH (benign prostatic hyperplasia)   . GERD (gastroesophageal reflux disease)   . H/O ETOH abuse   . Skull lesion     Left occipital condyle  . History of radiation therapy 01/05/13-02/10/13    45 gray to left occipital condyle region  . Multiple myeloma   . Radiation 02/21/14-03/08/14    right posterior chest wall area 30 gray  .  Radiation 04/19/14-05/02/14    lumbar spine 25 gray    has Neuropathy; BPH (benign prostatic hyperplasia); GERD (gastroesophageal reflux disease); H/O ETOH abuse; Skull lesion; Neck pain; Fatigue; Loss of weight; Insomnia; Allergic rhinitis; Lumbar compression fracture; Dermatitis; and Multiple myeloma without remission on his problem list.     is allergic to codeine and morphine and related.  Current Outpatient Prescriptions on File Prior to Visit  Medication Sig Dispense Refill  . acyclovir (ZOVIRAX) 400 MG tablet Take 1 tablet (400 mg total) by mouth 2 (two) times daily. 60 tablet 5  . ALPRAZolam (XANAX) 1 MG tablet Take 1-2 tablets (1-2 mg total) by mouth at bedtime as needed for sleep. For anxiety and/or sleep 60 tablet 2  . aspirin EC 325 MG tablet Take 1 tablet (325 mg total) by mouth daily. 30 tablet 11  . Calcium Carb-Cholecalciferol (CALCIUM-VITAMIN D3) 600-500 MG-UNIT CAPS Take 1 tablet by mouth 2 (two) times daily. With food 60 capsule 6  . cetirizine (ZYRTEC) 10 MG tablet Take 10 mg by mouth daily.    . clotrimazole (LOTRIMIN) 1 % cream Apply 1 application topically 2 (two) times daily. To ear 45 g 2  . diphenhydrAMINE (BENADRYL) 25 MG tablet Take 25 mg by mouth at bedtime as needed for sleep.    Marland Kitchen lenalidomide (REVLIMID) 10 MG capsule Take 1 capsule (10 mg total) by mouth every other day. 14 capsule 0  . omeprazole (PRILOSEC) 20 MG capsule Take 1 capsule (20 mg total) by mouth daily. 30 capsule 5  . Zoledronic Acid (ZOMETA IV) Inject into the vein. Every 28 days    . dexamethasone (DECADRON) 4 MG tablet Take 10 tablets (34m total) on Days 1, 8, & 15 of chemo. (Patient not taking: Reported on 10/02/2014) 70 tablet 1  . Diphenhyd-Hydrocort-Nystatin (FIRST-DUKES MOUTHWASH) SUSP Use as directed 5 mLs in the mouth or throat 4 (four) times daily as needed. (Patient not taking: Reported on 08/04/2014) 300 mL 1  . Doxepin HCl (SILENOR) 6 MG TABS Take 1 tablet (6 mg total) by mouth at bedtime.  (Patient not taking: Reported on 09/01/2014) 30 tablet 6  . ondansetron (ZOFRAN) 8 MG tablet Take 1 tablet (8 mg total) by mouth every 8 (eight) hours as needed for nausea or vomiting. (Patient not taking: Reported on 10/02/2014) 30 tablet 2   Current Facility-Administered Medications on File Prior to Visit  Medication Dose Route Frequency Provider Last Rate Last Dose  . sodium chloride 0.9 % injection 10 mL  10 mL Intracatheter PRN TBaird Cancer PA-C   10 mL at 08/04/14 1103    Past Surgical History  Procedure Laterality Date  . Cyst excision  1959    tail bone    Denies any headaches, dizziness, double vision, fevers, chills, night sweats, nausea, vomiting, diarrhea, constipation, chest pain, heart palpitations, shortness of breath, blood in stool, black tarry stool, urinary pain, urinary burning, urinary frequency, hematuria.   PHYSICAL EXAMINATION  ECOG PERFORMANCE STATUS: 1 - Symptomatic but completely ambulatory  Filed Vitals:   10/02/14 1028  BP: 144/56  Pulse: 71  Temp: 97.6  F (36.4 C)  Resp: 18    GENERAL:alert, no distress, well nourished, well developed, comfortable, cooperative, obese and smiling SKIN: skin color, texture, turgor are normal, no rashes or significant lesions HEAD: Normocephalic, No masses, lesions, tenderness or abnormalities EYES: EOMI EARS: External ears normal OROPHARYNX:lips, buccal mucosa, and tongue normal and mucous membranes are moist  NECK: supple, trachea midline LYMPH:  not examined BREAST:not examined LUNGS: clear to auscultation, normal percussion.  HEART: regular rate & rhythm, no murmurs and no gallops ABDOMEN:abdomen soft and normal bowel sounds BACK: Back symmetric, no curvature. EXTREMITIES:less then 2 second capillary refill, no joint deformities, effusion, or inflammation, no skin discoloration, no cyanosis  NEURO: alert & oriented x 3 with fluent speech, no focal motor/sensory deficits, gait normal   LABORATORY  DATA: CBC    Component Value Date/Time   WBC 4.4 10/02/2014 1020   RBC 4.87 10/02/2014 1020   HGB 14.6 10/02/2014 1020   HCT 43.3 10/02/2014 1020   PLT 127* 10/02/2014 1020   MCV 88.9 10/02/2014 1020   MCH 30.0 10/02/2014 1020   MCHC 33.7 10/02/2014 1020   RDW 14.5 10/02/2014 1020   LYMPHSABS 0.9 10/02/2014 1020   MONOABS 0.7 10/02/2014 1020   EOSABS 0.1 10/02/2014 1020   BASOSABS 0.0 10/02/2014 1020      Chemistry      Component Value Date/Time   NA 141 09/15/2014 1005   K 4.3 09/15/2014 1005   CL 108 09/15/2014 1005   CO2 26 09/15/2014 1005   BUN 22* 09/15/2014 1005   CREATININE 0.73 09/15/2014 1005   CREATININE 0.76 02/06/2014 1249      Component Value Date/Time   CALCIUM 8.7* 09/15/2014 1005   ALKPHOS 31* 09/15/2014 1005   AST 17 09/15/2014 1005   ALT 12* 09/15/2014 1005   BILITOT 0.4 09/15/2014 1005     Lab Results  Component Value Date   PROT 6.1* 09/15/2014   ALBUMINELP 52.2* 04/07/2014   A1GS 6.7* 04/07/2014   A2GS 15.4* 04/07/2014   BETS 6.0 04/07/2014   BETA2SER 5.3 04/07/2014   GAMS 14.4 04/07/2014   MSPIKE 0.45 04/07/2014   SPEI (NOTE) 04/07/2014   SPECOM (NOTE) 04/07/2014   IGGSERUM 569* 09/01/2014   IGA 103 09/01/2014   IGMSERUM 21 09/01/2014   IMMELINT (NOTE) 04/07/2014   KPAFRELGTCHN 21.53* 09/01/2014   LAMBDASER 11.87 09/01/2014   KAPLAMBRATIO 1.81* 09/01/2014    PENDING LABS:   RADIOGRAPHIC STUDIES:  No results found.   PATHOLOGY:    ASSESSMENT AND PLAN:  Multiple myeloma without remission Multiple myeloma, monoclonal IgG kappa with scattered osseous lesions.  PET imaging on 08/11/2014 demonstrates significant response to therapy.  Revlimid-induced 25 mg daily rash requiring a break in therapy on 7/25 and treatment with Medrol Dose Pak resulting in a complete resolution of rash  He reports that he started Revlimid 10 mg every other day on 09/04/2014.  He is not taking Dexamethasone at this time.  We will work on this moving  forward.  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  He has been intolerant to Velcade due to PN and intolerant to Revlimid due to rash, despite attempts to treat through it with symptomatic management.  M-Spike remains.    Labs as scheduled on 9/9.  Rx refill for Oxycodone provided.  Additionally, I escribed refills on Gabapentin and Flomax.  He notes improvement in his PN with Gabapentin.  He notes that for a while he discontinued the medication  on his own and he believes a major part in his PN progression is secondary to him stopping this medication.  Nevertheless, he has grade 2 PN.  Return in 2 weeks for follow-up.  If still doing well, will consider starting Dexamethasone with split dosing.  If that is tolerated, will try to push Revlimid dose further as this may prevent Revlimid-induced rash.    THERAPY PLAN:  Continue Revlimid 10 mg QOD.  If tolerated, will add Dexamethasone in a split-dosing fashion.  If this is tolerated, will need to consider increasing Revlimid dose as the addition of Dexamethasone may prevent Revlimid-induced rash.  All questions were answered. The patient knows to call the clinic with any problems, questions or concerns. We can certainly see the patient much sooner if necessary.  Patient and plan discussed with Dr. Ancil Linsey and she is in agreement with the aforementioned.   This note is electronically signed by: Doy Mince 10/02/2014 10:54 AM

## 2014-10-02 NOTE — Assessment & Plan Note (Addendum)
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  PET imaging on 08/11/2014 demonstrates significant response to therapy.  Revlimid-induced 25 mg daily rash requiring a break in therapy on 7/25 and treatment with Medrol Dose Pak resulting in a complete resolution of rash  He reports that he started Revlimid 10 mg every other day on 09/04/2014.  He is not taking Dexamethasone at this time.  We will work on this moving forward.  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  He has been intolerant to Velcade due to PN and intolerant to Revlimid due to rash, despite attempts to treat through it with symptomatic management.  M-Spike remains.    Labs as scheduled on 9/9.  Zometa scheduled for 9/9 as well.  Rx refill for Oxycodone provided.  Additionally, I escribed refills on Gabapentin and Flomax.  He notes improvement in his PN with Gabapentin.  He notes that for a while he discontinued the medication on his own and he believes a major part in his PN progression is secondary to him stopping this medication.  Nevertheless, he has grade 2 PN.  Return in 2 weeks for follow-up.  If still doing well, will consider starting Dexamethasone with split dosing.  If that is tolerated, will try to push Revlimid dose further as this may prevent Revlimid-induced rash.

## 2014-10-03 ENCOUNTER — Other Ambulatory Visit (HOSPITAL_COMMUNITY): Payer: Self-pay | Admitting: Oncology

## 2014-10-03 LAB — KAPPA/LAMBDA LIGHT CHAINS
Kappa free light chain: 42.73 mg/L — ABNORMAL HIGH (ref 3.30–19.40)
Kappa, lambda light chain ratio: 2.19 — ABNORMAL HIGH (ref 0.26–1.65)
Lambda free light chains: 19.47 mg/L (ref 5.71–26.30)

## 2014-10-03 LAB — BETA 2 MICROGLOBULIN, SERUM: Beta-2 Microglobulin: 1.7 mg/L (ref 0.6–2.4)

## 2014-10-03 LAB — MULTIPLE MYELOMA PANEL, SERUM
Albumin SerPl Elph-Mcnc: 3.2 g/dL (ref 2.9–4.4)
Albumin/Glob SerPl: 1.3 (ref 0.7–1.7)
Alpha 1: 0.3 g/dL (ref 0.0–0.4)
Alpha2 Glob SerPl Elph-Mcnc: 0.8 g/dL (ref 0.4–1.0)
B-Globulin SerPl Elph-Mcnc: 1 g/dL (ref 0.7–1.3)
Gamma Glob SerPl Elph-Mcnc: 0.5 g/dL (ref 0.4–1.8)
Globulin, Total: 2.5 g/dL (ref 2.2–3.9)
IgA: 121 mg/dL (ref 61–437)
IgG (Immunoglobin G), Serum: 606 mg/dL — ABNORMAL LOW (ref 700–1600)
IgM, Serum: 24 mg/dL (ref 15–143)
M Protein SerPl Elph-Mcnc: 0.1 g/dL — ABNORMAL HIGH
Total Protein ELP: 5.7 g/dL — ABNORMAL LOW (ref 6.0–8.5)

## 2014-10-03 NOTE — Progress Notes (Signed)
LABS DRAWN

## 2014-10-05 ENCOUNTER — Ambulatory Visit (HOSPITAL_COMMUNITY): Payer: Medicare Other | Admitting: Oncology

## 2014-10-05 ENCOUNTER — Other Ambulatory Visit (HOSPITAL_COMMUNITY): Payer: Medicare Other

## 2014-10-13 ENCOUNTER — Encounter (HOSPITAL_COMMUNITY): Payer: Medicare Other

## 2014-10-13 ENCOUNTER — Ambulatory Visit (HOSPITAL_COMMUNITY): Payer: Medicare Other

## 2014-10-13 ENCOUNTER — Encounter (HOSPITAL_COMMUNITY): Payer: Self-pay | Admitting: Hematology & Oncology

## 2014-10-13 ENCOUNTER — Encounter (HOSPITAL_COMMUNITY): Payer: Medicare Other | Attending: Hematology and Oncology | Admitting: Hematology & Oncology

## 2014-10-13 VITALS — BP 126/70 | HR 82 | Temp 97.6°F | Resp 20 | Wt 228.8 lb

## 2014-10-13 DIAGNOSIS — R634 Abnormal weight loss: Secondary | ICD-10-CM | POA: Insufficient documentation

## 2014-10-13 DIAGNOSIS — D472 Monoclonal gammopathy: Secondary | ICD-10-CM | POA: Diagnosis not present

## 2014-10-13 DIAGNOSIS — M542 Cervicalgia: Secondary | ICD-10-CM | POA: Insufficient documentation

## 2014-10-13 DIAGNOSIS — N400 Enlarged prostate without lower urinary tract symptoms: Secondary | ICD-10-CM | POA: Insufficient documentation

## 2014-10-13 DIAGNOSIS — M899 Disorder of bone, unspecified: Secondary | ICD-10-CM | POA: Diagnosis not present

## 2014-10-13 DIAGNOSIS — K219 Gastro-esophageal reflux disease without esophagitis: Secondary | ICD-10-CM | POA: Diagnosis not present

## 2014-10-13 DIAGNOSIS — C9000 Multiple myeloma not having achieved remission: Secondary | ICD-10-CM

## 2014-10-13 DIAGNOSIS — Z923 Personal history of irradiation: Secondary | ICD-10-CM | POA: Insufficient documentation

## 2014-10-13 DIAGNOSIS — R630 Anorexia: Secondary | ICD-10-CM | POA: Diagnosis not present

## 2014-10-13 LAB — COMPREHENSIVE METABOLIC PANEL
ALT: 11 U/L — ABNORMAL LOW (ref 17–63)
AST: 15 U/L (ref 15–41)
Albumin: 3.5 g/dL (ref 3.5–5.0)
Alkaline Phosphatase: 22 U/L — ABNORMAL LOW (ref 38–126)
Anion gap: 4 — ABNORMAL LOW (ref 5–15)
BUN: 18 mg/dL (ref 6–20)
CO2: 24 mmol/L (ref 22–32)
Calcium: 8.2 mg/dL — ABNORMAL LOW (ref 8.9–10.3)
Chloride: 110 mmol/L (ref 101–111)
Creatinine, Ser: 0.6 mg/dL — ABNORMAL LOW (ref 0.61–1.24)
GFR calc Af Amer: >60 mL/min (ref >60)
GFR calc non Af Amer: >60 mL/min (ref >60)
Glucose, Bld: 104 mg/dL — ABNORMAL HIGH (ref 65–99)
Potassium: 3.7 mmol/L (ref 3.5–5.1)
Sodium: 138 mmol/L (ref 135–145)
Total Bilirubin: 0.6 mg/dL (ref 0.3–1.2)
Total Protein: 6.3 g/dL — ABNORMAL LOW (ref 6.5–8.1)

## 2014-10-13 NOTE — Progress Notes (Signed)
[facility] Physical Impairment and Functional Assessment Screening Tool  1. Are you having any pain, including headaches, joint pain, or muscle pain (upper body = OT; lower body = PT)?  Yes, but I hand this before my cancer diagnosis.  2. Do your hands and/or feet feel numb or tingle (PT)?  Yes, but I hand this before my cancer diagnosis.  3. Does any part of your body feel swollen or larger than usual (upper body = OT; lower body = PT)?  No  4. Are you so tired that you cannot do the things you want or need to do (PT or OT)?  Yes, this started after my diagnosis and is still a problem.  5. Are you feeling weak or are you having trouble moving any part of your body (PT/OT)?  Yes, this started after my diagnosis and is still a problem.  6. Are you having trouble concentrating, thinking, or remembering things (OT/ST)?  Yes, this started after my diagnosis and is still a problem.  7. Are you having trouble moving around or feel like you might trip or fall (PT)?  Yes, this started after my diagnosis and is still a problem.  8. Are you having trouble swallowing (ST)?  No  9. Are you having trouble speaking (ST)?  No  10. Are you having trouble with going or getting to the bathroom (OT)?  No  11. Are you having trouble with your sexual function (OT)?  No  12. Are you having trouble lifting things, even just your arms (OT/PT)?  No  13. Are you having trouble taking care of yourself as in dressing or bathing (OT)?  No  14. Are you having trouble with daily tasks like chores or shopping (OT)?  Yes, this started after my diagnosis and is still a problem.  15. Are you having trouble driving (OT)?  Yes, this started after my diagnosis and is still a problem.  79. Are you having trouble returning to work or completing your tasks at work (OT)?  No  Other concerns:    Legend: OT = Occupational Therapy PT = Physical Therapy ST = Speech Therapy

## 2014-10-13 NOTE — Progress Notes (Signed)
[name], MD [address] [place] [address]  Multiple Myeloma, IgG kappa  Left occipital condyle infiltrative lesion, not amenable to biopsy. Palliative XRT without a tissue diagnosis  Chest x-ray and right rib films were obtained which showed a soft tissue mass along the right posterior fifth rib with some rib destruction.  Monoclonal IgG kappa, normal IgG levels, suppressed IgM, abnormal kappa/lamda ratio  PET/CT  03/16/2014 Scattered hypermetabolic osseous lesions, including the calvarium, ribs, left scapula, sacrum, and left femoral shaft. An expansile left vertebral body lesion at L3 extends into the epidural space of the left lateral recess, and probably merits lumbar spine MRI assessment.  BMBX on 02/20/2014 with 33% kappa restricted plasma cells Normal FISH, normal cytogenetics  Peripheral evaluation shows very small M spike, normal CBC, normal CMP, myeloma survey grossly underestimates extent of bone involvement  CURRENT THERAPY: RD currently, RVD on 04/2014  INTERVAL HISTORY: Dean [name] 79 y.o. male returns for followup of multiple myeloma, monoclonal IgG kappa with scattered osseous lesions.  Dean [name] is here alone today. He has come in today with some complaints of pain in his right side, and pain in his neck.  He says he's been trying to do more these days, but it's made difficult by the soreness along the sides of his neck, and along the right side of his body.  He remarks that the dexamethasone steroid makes him "feel up but then he crashes after about three days." He says his pain medicine "doesn't make him feel anything [in terms of side-effects]." He feels that working on his lawnmower has contributed to his back and side pain because he's been "stooping and picking it up."  He says he's been sleeping "fairly better than he's been sleeping" previously, and that he is not waking up due to pain.  He says his percocet  doesn't completely stop his pain, but it does alleviate it. He believes that the true cause of his pain is "working all day."  Dean [name] states that it's hard for him to get back up after "laying on the ground."   He say's he's eating "about twenty percent." He has good days and bad days.   When asked if he is open to attending cancer rehab to rebuild his strength, Dean [name] said "he's been thinking about doing it himself." He eventually agreed that he was open to consulting with the rehab center to discover ways to rebuild his strength safely.  He is still taking revlimid every other day. He is also taking calcium and vitamin D; "three 600 mg tablets and Tums."  He remarks that he "never fells well." but acknowledges that compared to the time of his diagnosis he is a lot better.     Multiple myeloma without remission   11/16/2012 Initial Diagnosis L occipital condyle destructive lesion, not biopsied secondary to location. XRT   12/01/2012 Imaging L3 superior endplate compression FX, no lytic lesions to suggest myeloma   02/06/2014 Imaging soft tissue mass along the right posterior fifth rib with some rib destruction   02/16/2014 Miscellaneous Normal CBC, Normal CMP, kappa,lamda ratio of 2.62 (abnormal), UIEP with slightly restricted band, IgG kappa, SPEP/IEP with monoclonal protein at 0.79 g/dl, normal igG, suppressed IGM   02/20/2014 Bone Marrow Biopsy 33% kappa restricted plasma cells Normal FISH, normal cytogenetics   02/21/2014 - 03/08/2014 Radiation Therapy 30Gy to R rib lesion, lesion not biopsied   03/16/2014 PET scan  Scattered hypermetabolic osseous lesions, including the calvarium, ribs, left scapula, sacrum, and left femoral shaft. An expansile left vertebral body lesion at L3 extends into the epidural space of the left lateral recess,    03/17/2014 - 04/07/2014 Chemotherapy Velcade and Dexamethasone due to initial denial of Revlimid by insurance company.  Zometa monthly.   03/22/2014 Imaging  MR_ L-Spine- Enhancing lesions compatible with multiple myeloma at L2, L3, and S1-2.   04/07/2014 - 07/28/2014 Chemotherapy RVD with Revlimid at 25 mg days 1-14.  Revlimid dose reduced to 25 mg every other day x 14 days with 7 day respite beginning on day 1 of cycle 2.   04/17/2014 Adverse Reaction Revlimid-induced rash.  Treated with steroids.   07/28/2014 - 08/04/2014 Chemotherapy Revlimid daily   08/04/2014 Adverse Reaction Velcade-induced peripheral neuropathy   08/04/2014 Treatment Plan Change D/C Velcade   08/11/2014 PET scan Response to therapy. Improvement and resolution of foci of osseous hypermetabolism.   08/22/2014 - 08/28/2014 Chemotherapy Revlimid 25 mg days 1-21 every 28 days   08/28/2014 Adverse Reaction Revlimid-induced rash. Medication held.  Medrol dose Pak prescribed.   09/04/2014 -  Chemotherapy Revlimid 10 mg every other day, without dexamethasone     Past Medical History  Diagnosis Date  . Neuropathy   . BPH (benign prostatic hyperplasia)   . GERD (gastroesophageal reflux disease)   . H/O ETOH abuse   . Skull lesion     Left occipital condyle  . History of radiation therapy 01/05/13-02/10/13    45 gray to left occipital condyle region  . Multiple myeloma   . Radiation 02/21/14-03/08/14    right posterior chest wall area 30 gray  . Radiation 04/19/14-05/02/14    lumbar spine 25 gray    has Neuropathy; BPH (benign prostatic hyperplasia); GERD (gastroesophageal reflux disease); H/O ETOH abuse; Skull lesion; Neck pain; Fatigue; Loss of weight; Insomnia; Allergic rhinitis; Lumbar compression fracture; Dermatitis; and Multiple myeloma without remission on his problem list.     is allergic to codeine and morphine and related.  Dean [name] does not currently have medications on file.  Past Surgical History  Procedure Laterality Date  . Cyst excision  1959    tail bone    Denies any headaches, dizziness, double vision, fevers, chills, night sweats, nausea, vomiting, diarrhea,  constipation, chest pain, heart palpitations, shortness of breath, blood in stool, black tarry stool, urinary pain, urinary burning, urinary frequency, hematuria. Positive for neuropathy and appetite loss. Neuropathy has moved to his ankles. Appetite loss due to loss in taste and smell.    PHYSICAL EXAMINATION  ECOG PERFORMANCE STATUS: 0 - Asymptomatic  Filed Vitals:   10/13/14 0823  BP: 126/70  Pulse: 82  Temp: 97.6 F (36.4 C)  Resp: 20    GENERAL:alert, no distress, well nourished, well developed, comfortable, cooperative, obese and smiling SKIN: skin color, texture, turgor are normal, no rashes or significant lesions HEAD: Normocephalic, No masses, lesions, tenderness or abnormalities EYES: normal, PERRLA, EOMI, Conjunctiva are pink and non-injected. Wears dark sunglasses all the time EARS: External ears normal OROPHARYNX:lips, buccal mucosa, and tongue normal and mucous membranes are moist  NECK: supple, no adenopathy, thyroid normal size, non-tender, without nodularity, no stridor, non-tender, trachea midline LYMPH:  no palpable lymphadenopathy BREAST:not examined LUNGS: clear to auscultation  HEART: regular rate & rhythm, no murmurs and no gallops ABDOMEN:abdomen soft, non-tender, obese and normal bowel sounds BACK: Back symmetric, no curvature., No CVA tenderness EXTREMITIES:less then 2 second capillary refill, no joint deformities, effusion, or  inflammation, no edema, no skin discoloration, no clubbing, no cyanosis  NEURO: alert & oriented x 3 with fluent speech, no focal motor/sensory deficits, gait normal   LABORATORY DATA: CBC    Component Value Date/Time   WBC 4.4 10/02/2014 1020   RBC 4.87 10/02/2014 1020   HGB 14.6 10/02/2014 1020   HCT 43.3 10/02/2014 1020   PLT 127* 10/02/2014 1020   MCV 88.9 10/02/2014 1020   MCH 30.0 10/02/2014 1020   MCHC 33.7 10/02/2014 1020   RDW 14.5 10/02/2014 1020   LYMPHSABS 0.9 10/02/2014 1020   MONOABS 0.7 10/02/2014 1020     EOSABS 0.1 10/02/2014 1020   BASOSABS 0.0 10/02/2014 1020      Chemistry      Component Value Date/Time   NA 138 10/13/2014 0855   K 3.7 10/13/2014 0855   CL 110 10/13/2014 0855   CO2 24 10/13/2014 0855   BUN 18 10/13/2014 0855   CREATININE 0.60* 10/13/2014 0855   CREATININE 0.76 02/06/2014 1249      Component Value Date/Time   CALCIUM 8.2* 10/13/2014 0855   ALKPHOS 22* 10/13/2014 0855   AST 15 10/13/2014 0855   ALT 11* 10/13/2014 0855   BILITOT 0.6 10/13/2014 0855     Lab Results  Component Value Date   PROT 6.3* 10/13/2014   ALBUMINELP 52.2* 04/07/2014   A1GS 6.7* 04/07/2014   A2GS 15.4* 04/07/2014   BETS 6.0 04/07/2014   BETA2SER 5.3 04/07/2014   GAMS 14.4 04/07/2014   MSPIKE 0.45 04/07/2014   SPEI (NOTE) 04/07/2014   SPECOM (NOTE) 04/07/2014   IGGSERUM 606* 10/02/2014   IGA 121 10/02/2014   IGMSERUM 24 10/02/2014   IMMELINT (NOTE) 04/07/2014   KPAFRELGTCHN 42.73* 10/02/2014   LAMBDASER 19.47 10/02/2014   KAPLAMBRATIO 2.19* 10/02/2014     ASSESSMENT AND PLAN:  IgG kappa myeloma Multiple bone lesions seen on PET imaging RVD therapy Rash on revlimid, currently on every other day dosing Insomnia Hypocalcemia  Unfortunately he has had trouble with Revlimid, causing a  rash.  He has been very reluctant to work with me on trying daily dosing. I have had many patients whose rash resolves with appropriate management over time.  M spike has fallen from 0.8 to 0.1, but the only reasonable way to follow his disease is via PET imaging.   He has completed 6 cycles of RVD. Currently he continues on revlimid, but is only willing to take it every other day.  He is still on weekly dexamethasone which he will divide into 20 mg on Tuesday and 20 mg on Friday.  He states he cannot tolerate it all in one day.  Last PET was on 7/8 with improvement/resolution of disease.   We will continue the monthly Zometa, calcium level permitting. He will continue on his current calcium  and vitamin D dosing.  Enrolled him in the STAR program  Follow-up in two weeks with me, and will take next steps at that point  All questions were answered. The patient knows to call the clinic with any problems, questions or concerns. We can certainly see the patient much sooner if necessary.  This note is electronically signed.  This document serves as a record of services personally performed by Ancil Linsey, MD. It was created on her behalf by Toni Amend, a trained medical scribe. The creation of this record is based on the scribe's personal observations and the provider's statements to them. This document has been checked and approved by the attending provider.  I have reviewed the above documentation for accuracy and completeness, and I agree with the above.  Kelby Fam. Jadi Deyarmin MD

## 2014-10-13 NOTE — Progress Notes (Signed)
Zometa held today d/t Ca++ 8.2

## 2014-10-13 NOTE — Patient Instructions (Addendum)
[place] at [facility] Discharge Instructions  RECOMMENDATIONS MADE BY THE CONSULTANT AND ANY TEST RESULTS WILL BE SENT TO YOUR REFERRING PHYSICIAN.  Exam per Dr.Penland. No Zometa today, Calcium is still low. Continue taking Calcium supplements at home as instructed. We have made a [facility]AR referral to Physical Therapy. You should expect a call from them next week to schedule an appointment for evaluation. Return as scheduled for repeat labs, possible Zometa infusion and MD appointment.  Thank you for choosing [place] at [facility] to provide your oncology and hematology care.  To afford each patient quality time with our provider, please arrive at least 15 minutes before your scheduled appointment time.    You need to re-schedule your appointment should you arrive 10 or more minutes late.  We strive to give you quality time with our providers, and arriving late affects you and other patients whose appointments are after yours.  Also, if you no show three or more times for appointments you may be dismissed from the clinic at the providers discretion.     Again, thank you for choosing [facility].  Our hope is that these requests will decrease the amount of time that you wait before being seen by our physicians.       _____________________________________________________________  Should you have questions after your visit to [facility], please contact our office at [phone] between the hours of 8:30 a.m. and 4:30 p.m.  Voicemails left after 4:30 p.m. will not be returned until the following business day.  For prescription refill requests, have your pharmacy contact our office.

## 2014-10-17 ENCOUNTER — Ambulatory Visit (HOSPITAL_COMMUNITY): Payer: Medicare Other | Attending: Hematology & Oncology | Admitting: Physical Therapy

## 2014-10-27 ENCOUNTER — Encounter (HOSPITAL_COMMUNITY): Payer: Medicare Other

## 2014-10-27 ENCOUNTER — Encounter (HOSPITAL_BASED_OUTPATIENT_CLINIC_OR_DEPARTMENT_OTHER): Payer: Medicare Other | Admitting: Hematology & Oncology

## 2014-10-27 ENCOUNTER — Other Ambulatory Visit (HOSPITAL_COMMUNITY): Payer: Self-pay | Admitting: Oncology

## 2014-10-27 DIAGNOSIS — K219 Gastro-esophageal reflux disease without esophagitis: Secondary | ICD-10-CM | POA: Diagnosis not present

## 2014-10-27 DIAGNOSIS — D472 Monoclonal gammopathy: Secondary | ICD-10-CM | POA: Diagnosis not present

## 2014-10-27 DIAGNOSIS — C9000 Multiple myeloma not having achieved remission: Secondary | ICD-10-CM

## 2014-10-27 DIAGNOSIS — R630 Anorexia: Secondary | ICD-10-CM | POA: Diagnosis not present

## 2014-10-27 DIAGNOSIS — R634 Abnormal weight loss: Secondary | ICD-10-CM | POA: Diagnosis not present

## 2014-10-27 DIAGNOSIS — M899 Disorder of bone, unspecified: Secondary | ICD-10-CM | POA: Diagnosis not present

## 2014-10-27 DIAGNOSIS — M545 Low back pain: Secondary | ICD-10-CM | POA: Diagnosis not present

## 2014-10-27 DIAGNOSIS — N400 Enlarged prostate without lower urinary tract symptoms: Secondary | ICD-10-CM | POA: Diagnosis not present

## 2014-10-27 LAB — COMPREHENSIVE METABOLIC PANEL
ALT: 16 U/L — ABNORMAL LOW (ref 17–63)
AST: 23 U/L (ref 15–41)
Albumin: 3.1 g/dL — ABNORMAL LOW (ref 3.5–5.0)
Alkaline Phosphatase: 25 U/L — ABNORMAL LOW (ref 38–126)
Anion gap: 5 (ref 5–15)
BUN: 18 mg/dL (ref 6–20)
CO2: 24 mmol/L (ref 22–32)
Calcium: 8.1 mg/dL — ABNORMAL LOW (ref 8.9–10.3)
Chloride: 110 mmol/L (ref 101–111)
Creatinine, Ser: 0.73 mg/dL (ref 0.61–1.24)
GFR calc Af Amer: >60 mL/min (ref >60)
GFR calc non Af Amer: >60 mL/min (ref >60)
Glucose, Bld: 84 mg/dL (ref 65–99)
Potassium: 3.7 mmol/L (ref 3.5–5.1)
Sodium: 139 mmol/L (ref 135–145)
Total Bilirubin: 0.6 mg/dL (ref 0.3–1.2)
Total Protein: 5.5 g/dL — ABNORMAL LOW (ref 6.5–8.1)

## 2014-10-27 LAB — CBC WITH DIFFERENTIAL/PLATELET
Basophils Absolute: 0 10*3/uL (ref 0.0–0.1)
Basophils Relative: 0 %
Eosinophils Absolute: 0.1 10*3/uL (ref 0.0–0.7)
Eosinophils Relative: 1 %
HCT: 41.6 % (ref 39.0–52.0)
Hemoglobin: 13.9 g/dL (ref 13.0–17.0)
Lymphocytes Relative: 16 %
Lymphs Abs: 1.2 10*3/uL (ref 0.7–4.0)
MCH: 29.8 pg (ref 26.0–34.0)
MCHC: 33.4 g/dL (ref 30.0–36.0)
MCV: 89.3 fL (ref 78.0–100.0)
Monocytes Absolute: 1.2 10*3/uL — ABNORMAL HIGH (ref 0.1–1.0)
Monocytes Relative: 17 %
Neutro Abs: 4.6 10*3/uL (ref 1.7–7.7)
Neutrophils Relative %: 66 %
Platelets: 136 10*3/uL — ABNORMAL LOW (ref 150–400)
RBC: 4.66 MIL/uL (ref 4.22–5.81)
RDW: 15.5 % (ref 11.5–15.5)
WBC: 7 10*3/uL (ref 4.0–10.5)

## 2014-10-27 MED ORDER — OXYCODONE-ACETAMINOPHEN 10-325 MG PO TABS: 1.0000 | ORAL_TABLET | ORAL | Status: DC | PRN

## 2014-10-27 MED ORDER — LENALIDOMIDE 10 MG PO CAPS: 10.0000 mg | ORAL_CAPSULE | ORAL | Status: DC

## 2014-10-27 NOTE — Progress Notes (Signed)
Dean Fraction, MD [address] [place] [address]  Multiple Myeloma, IgG kappa  Left occipital condyle infiltrative lesion, not amenable to biopsy. Palliative XRT without a tissue diagnosis  Chest x-ray and right rib films were obtained which showed a soft tissue mass along the right posterior fifth rib with some rib destruction.  Monoclonal IgG kappa, normal IgG levels, suppressed IgM, abnormal kappa/lamda ratio  PET/CT  03/16/2014 Scattered hypermetabolic osseous lesions, including the calvarium, ribs, left scapula, sacrum, and left femoral shaft. An expansile left vertebral body lesion at L3 extends into the epidural space of the left lateral recess, and probably merits lumbar spine MRI assessment.  BMBX on 02/20/2014 with 33% kappa restricted plasma cells Normal FISH, normal cytogenetics  Peripheral evaluation shows very small M spike, normal CBC, normal CMP, myeloma survey grossly underestimates extent of bone involvement  CURRENT THERAPY: RD currently, RVD started on on 04/2014  INTERVAL HISTORY: Dean [name] 79 y.o. male returns for followup of multiple myeloma, monoclonal IgG kappa with scattered osseous lesions  Dean [name] is here alone today. He has felt great, staying active and cleaning out his garage. He feels better than he has felt in the past year and a half.  He takes revlimid every other day. He takes 5 dexamethasone on Tuesday and 5 on Friday. He takes 3 calcium tablets per day, he has also been taking Tums 4-5 times per day. He takes 1 aspirin per day. He has been taking his pain medication, with about 7 left.   He states he is surprised by how well he feels. He denies headaches, nausea, vomiting, change in appetite.      Multiple myeloma without remission   11/16/2012 Initial Diagnosis L occipital condyle destructive lesion, not biopsied secondary to location. XRT   12/01/2012 Imaging L3 superior endplate compression FX, no  lytic lesions to suggest myeloma   02/06/2014 Imaging soft tissue mass along the right posterior fifth rib with some rib destruction   02/16/2014 Miscellaneous Normal CBC, Normal CMP, kappa,lamda ratio of 2.62 (abnormal), UIEP with slightly restricted band, IgG kappa, SPEP/IEP with monoclonal protein at 0.79 g/dl, normal igG, suppressed IGM   02/20/2014 Bone Marrow Biopsy 33% kappa restricted plasma cells Normal FISH, normal cytogenetics   02/21/2014 - 03/08/2014 Radiation Therapy 30Gy to R rib lesion, lesion not biopsied   03/16/2014 PET scan Scattered hypermetabolic osseous lesions, including the calvarium, ribs, left scapula, sacrum, and left femoral shaft. An expansile left vertebral body lesion at L3 extends into the epidural space of the left lateral recess,    03/17/2014 - 04/07/2014 Chemotherapy Velcade and Dexamethasone due to initial denial of Revlimid by insurance company.  Zometa monthly.   03/22/2014 Imaging MR_ L-Spine- Enhancing lesions compatible with multiple myeloma at L2, L3, and S1-2.   04/07/2014 - 07/28/2014 Chemotherapy RVD with Revlimid at 25 mg days 1-14.  Revlimid dose reduced to 25 mg every other day x 14 days with 7 day respite beginning on day 1 of cycle 2.   04/17/2014 Adverse Reaction Revlimid-induced rash.  Treated with steroids.   07/28/2014 - 08/04/2014 Chemotherapy Revlimid daily   08/04/2014 Adverse Reaction Velcade-induced peripheral neuropathy   08/04/2014 Treatment Plan Change D/C Velcade   08/11/2014 PET scan Response to therapy. Improvement and resolution of foci of osseous hypermetabolism.   08/22/2014 - 08/28/2014 Chemotherapy Revlimid 25 mg days 1-21 every 28 days   08/28/2014 Adverse Reaction Revlimid-induced rash. Medication held.  Medrol  dose Pak prescribed.   09/04/2014 -  Chemotherapy Revlimid 10 mg every other day, without dexamethasone     Past Medical History  Diagnosis Date  . Neuropathy   . BPH (benign prostatic hyperplasia)   . GERD (gastroesophageal reflux disease)     . H/O ETOH abuse   . Skull lesion     Left occipital condyle  . History of radiation therapy 01/05/13-02/10/13    45 gray to left occipital condyle region  . Multiple myeloma   . Radiation 02/21/14-03/08/14    right posterior chest wall area 30 gray  . Radiation 04/19/14-05/02/14    lumbar spine 25 gray    has Neuropathy; BPH (benign prostatic hyperplasia); GERD (gastroesophageal reflux disease); H/O ETOH abuse; Skull lesion; Neck pain; Fatigue; Loss of weight; Insomnia; Allergic rhinitis; Lumbar compression fracture; Dermatitis; and Multiple myeloma without remission on his problem list.     is allergic to codeine and morphine and related.  Dean [name] does not currently have medications on file.  Past Surgical History  Procedure Laterality Date  . Cyst excision  1959    tail bone    Denies any headaches, dizziness, double vision, fevers, chills, night sweats, nausea, vomiting, diarrhea, constipation, chest pain, heart palpitations, shortness of breath, blood in stool, black tarry stool, urinary pain, urinary burning, urinary frequency, hematuria. 14 point review of systems was performed and is negative except as detailed under history of present illness and above  PHYSICAL EXAMINATION  ECOG PERFORMANCE STATUS: 0 - Asymptomatic  Vitals with BMI 10/27/2014  Height   Weight 229 lbs  BMI   Systolic 993  Diastolic 58  Pulse 75  Respirations 20   GENERAL:alert, no distress, well nourished, well developed, comfortable, cooperative, obese and smiling SKIN: skin color, texture, turgor are normal, no rashes or significant lesions HEAD: Normocephalic, No masses, lesions, tenderness or abnormalities EYES: normal, PERRLA, EOMI, Conjunctiva are pink and non-injected. Wears dark sunglasses all the time EARS: External ears normal OROPHARYNX:lips, buccal mucosa, and tongue normal and mucous membranes are moist  NECK: supple, no adenopathy, thyroid normal size, non-tender, without nodularity,  no stridor, non-tender, trachea midline LYMPH:  no palpable lymphadenopathy BREAST:not examined LUNGS: clear to auscultation  HEART: regular rate & rhythm, no murmurs and no gallops ABDOMEN:abdomen soft, non-tender, obese and normal bowel sounds BACK: Back symmetric, no curvature., No CVA tenderness EXTREMITIES:less then 2 second capillary refill, no joint deformities, effusion, or inflammation, no edema, no skin discoloration, no clubbing, no cyanosis  NEURO: alert & oriented x 3 with fluent speech, no focal motor/sensory deficits, gait normal   LABORATORY DATA: I have reviewed the labs below. CBC    Component Value Date/Time   WBC 7.0 10/27/2014 0839   RBC 4.66 10/27/2014 0839   HGB 13.9 10/27/2014 0839   HCT 41.6 10/27/2014 0839   PLT 136* 10/27/2014 0839   MCV 89.3 10/27/2014 0839   MCH 29.8 10/27/2014 0839   MCHC 33.4 10/27/2014 0839   RDW 15.5 10/27/2014 0839   LYMPHSABS 1.2 10/27/2014 0839   MONOABS 1.2* 10/27/2014 0839   EOSABS 0.1 10/27/2014 0839   BASOSABS 0.0 10/27/2014 0839      Chemistry      Component Value Date/Time   NA 139 10/27/2014 0839   K 3.7 10/27/2014 0839   CL 110 10/27/2014 0839   CO2 24 10/27/2014 0839   BUN 18 10/27/2014 0839   CREATININE 0.73 10/27/2014 0839   CREATININE 0.76 02/06/2014 1249      Component Value  Date/Time   CALCIUM 8.1* 10/27/2014 0839   ALKPHOS 25* 10/27/2014 0839   AST 23 10/27/2014 0839   ALT 16* 10/27/2014 0839   BILITOT 0.6 10/27/2014 0839     Lab Results  Component Value Date   PROT 5.5* 10/27/2014   ALBUMINELP 52.2* 04/07/2014   A1GS 6.7* 04/07/2014   A2GS 15.4* 04/07/2014   BETS 6.0 04/07/2014   BETA2SER 5.3 04/07/2014   GAMS 14.4 04/07/2014   MSPIKE 0.45 04/07/2014   SPEI (NOTE) 04/07/2014   SPECOM (NOTE) 04/07/2014   IGGSERUM 606* 10/02/2014   IGA 121 10/02/2014   IGMSERUM 24 10/02/2014   IMMELINT (NOTE) 04/07/2014   KPAFRELGTCHN 42.73* 10/02/2014   LAMBDASER 19.47 10/02/2014   KAPLAMBRATIO  2.19* 10/02/2014     ASSESSMENT AND PLAN:  IgG kappa myeloma Multiple bone lesions seen on PET imaging RVD therapy Rash on revlimid, currently on every other day dosing Insomnia Hypocalcemia  Unfortunately he has had trouble with Revlimid, causing a  rash.  He has been very reluctant to work with me on trying daily dosing. I have had many patients whose rash resolves with appropriate management over time.  M spike has fallen from 0.8 to 0.1, it is stable.  Kappa/Lambda light chain ratio over time has been stable as well. The best way to follow his disease is via PET imaging. Last PET was on 7/8 with improvement/resolution of disease.  We can consider repeat PET imaging in December.   He has completed 6 cycles of RVD. Currently he continues on revlimid, but is only willing to take it every other day.  He is still on weekly dexamethasone which he will divide into 20 mg on Tuesday and 20 mg on Friday.  He states he cannot tolerate it all in one day. He is certainly doing well.   We will continue the monthly Zometa, calcium level permitting. He will continue on his current calcium and vitamin D dosing.  I discussed lengthening his follow up from 2 weeks to one month. Dean [name]. is agreeable to lengthening his follow up to one month. He will call if he needs anything else.  I have refilled his pain prescription (percocet)  All questions were answered. The patient knows to call the clinic with any problems, questions or concerns. We can certainly see the patient much sooner if necessary.  This note is electronically signed.  This document serves as a record of services personally performed by Ancil Linsey, MD. It was created on her behalf by Arlyce Harman, a trained medical scribe. The creation of this record is based on the scribe's personal observations and the provider's statements to them. This document has been checked and approved by the attending provider.  I have reviewed the  above documentation for accuracy and completeness, and I agree with the above.  Kelby Fam. Penland MD

## 2014-10-27 NOTE — Progress Notes (Signed)
Calcium 8.1. No zometa infusion today.

## 2014-10-27 NOTE — Progress Notes (Signed)
Labs drawn by RN 

## 2014-10-27 NOTE — Patient Instructions (Signed)
[place] at [facility] Discharge Instructions  RECOMMENDATIONS MADE BY THE CONSULTANT AND ANY TEST RESULTS WILL BE SENT TO YOUR REFERRING PHYSICIAN.  Exam and discussion by Dr. Whitney Muse No zometa today your calcium level is too low. Make sure you are taking your calcium as ordered. No other changes in therapy  Follow-up in 1 months with labs, office visit and zometa     Thank you for choosing [place] at [facility] to provide your oncology and hematology care.  To afford each patient quality time with our provider, please arrive at least 15 minutes before your scheduled appointment time.    You need to re-schedule your appointment should you arrive 10 or more minutes late.  We strive to give you quality time with our providers, and arriving late affects you and other patients whose appointments are after yours.  Also, if you no show three or more times for appointments you may be dismissed from the clinic at the providers discretion.     Again, thank you for choosing [facility].  Our hope is that these requests will decrease the amount of time that you wait before being seen by our physicians.       _____________________________________________________________  Should you have questions after your visit to [facility], please contact our office at [phone] between the hours of 8:30 a.m. and 4:30 p.m.  Voicemails left after 4:30 p.m. will not be returned until the following business day.  For prescription refill requests, have your pharmacy contact our office.

## 2014-11-04 ENCOUNTER — Encounter (HOSPITAL_COMMUNITY): Payer: Self-pay | Admitting: Hematology & Oncology

## 2014-11-10 ENCOUNTER — Telehealth: Payer: Self-pay | Admitting: Family Medicine

## 2014-11-10 MED ORDER — CYCLOBENZAPRINE HCL 10 MG PO TABS: 10.0000 mg | ORAL_TABLET | Freq: Three times a day (TID) | ORAL | Status: DC | PRN

## 2014-11-10 NOTE — Telephone Encounter (Signed)
PA for Cyclobenzaprine submitted thru [place] [id number].

## 2014-11-10 NOTE — Telephone Encounter (Signed)
Patients wife is calling to speak to you regarding his cramps and maybe getting something call in for this, per cancer doc he said to call and see if dr pickard would do this    [facility] [place]

## 2014-11-10 NOTE — Telephone Encounter (Signed)
Try flexeril 10 q 8 hrs prn cramps.  If not working, we could try hydroxychloroquine.

## 2014-11-10 NOTE — Telephone Encounter (Signed)
Medication called/sent to requested pharmacy and pt aware 

## 2014-11-13 NOTE — Telephone Encounter (Signed)
Cyclobenzaprine approved 08/13/2014 thru 11/11/2015.  Pharmacy made aware.

## 2014-11-27 ENCOUNTER — Encounter (HOSPITAL_COMMUNITY): Payer: Self-pay | Admitting: Hematology & Oncology

## 2014-11-27 ENCOUNTER — Other Ambulatory Visit (HOSPITAL_COMMUNITY): Payer: Self-pay | Admitting: Hematology & Oncology

## 2014-11-27 ENCOUNTER — Encounter (HOSPITAL_COMMUNITY): Payer: Medicare Other | Attending: Hematology and Oncology | Admitting: Hematology & Oncology

## 2014-11-27 ENCOUNTER — Encounter (HOSPITAL_BASED_OUTPATIENT_CLINIC_OR_DEPARTMENT_OTHER): Payer: Medicare Other

## 2014-11-27 ENCOUNTER — Encounter (HOSPITAL_COMMUNITY): Payer: Medicare Other

## 2014-11-27 VITALS — BP 127/65 | HR 99 | Temp 98.0°F | Resp 20 | Wt 224.2 lb

## 2014-11-27 DIAGNOSIS — Z23 Encounter for immunization: Secondary | ICD-10-CM | POA: Diagnosis not present

## 2014-11-27 DIAGNOSIS — C9000 Multiple myeloma not having achieved remission: Secondary | ICD-10-CM

## 2014-11-27 DIAGNOSIS — K219 Gastro-esophageal reflux disease without esophagitis: Secondary | ICD-10-CM | POA: Insufficient documentation

## 2014-11-27 DIAGNOSIS — G4700 Insomnia, unspecified: Secondary | ICD-10-CM | POA: Diagnosis not present

## 2014-11-27 DIAGNOSIS — R630 Anorexia: Secondary | ICD-10-CM | POA: Insufficient documentation

## 2014-11-27 DIAGNOSIS — D472 Monoclonal gammopathy: Secondary | ICD-10-CM | POA: Diagnosis not present

## 2014-11-27 DIAGNOSIS — M542 Cervicalgia: Secondary | ICD-10-CM | POA: Insufficient documentation

## 2014-11-27 DIAGNOSIS — N400 Enlarged prostate without lower urinary tract symptoms: Secondary | ICD-10-CM | POA: Insufficient documentation

## 2014-11-27 DIAGNOSIS — M899 Disorder of bone, unspecified: Secondary | ICD-10-CM | POA: Diagnosis not present

## 2014-11-27 DIAGNOSIS — R252 Cramp and spasm: Secondary | ICD-10-CM | POA: Diagnosis not present

## 2014-11-27 DIAGNOSIS — Z923 Personal history of irradiation: Secondary | ICD-10-CM | POA: Insufficient documentation

## 2014-11-27 DIAGNOSIS — M545 Low back pain: Secondary | ICD-10-CM

## 2014-11-27 DIAGNOSIS — Z0000 Encounter for general adult medical examination without abnormal findings: Secondary | ICD-10-CM

## 2014-11-27 DIAGNOSIS — R634 Abnormal weight loss: Secondary | ICD-10-CM | POA: Insufficient documentation

## 2014-11-27 DIAGNOSIS — R21 Rash and other nonspecific skin eruption: Secondary | ICD-10-CM | POA: Diagnosis not present

## 2014-11-27 LAB — CBC WITH DIFFERENTIAL/PLATELET
Basophils Absolute: 0 10*3/uL (ref 0.0–0.1)
Basophils Relative: 0 %
Eosinophils Absolute: 0 10*3/uL (ref 0.0–0.7)
Eosinophils Relative: 1 %
HCT: 41.1 % (ref 39.0–52.0)
Hemoglobin: 13.9 g/dL (ref 13.0–17.0)
Lymphocytes Relative: 11 %
Lymphs Abs: 0.8 10*3/uL (ref 0.7–4.0)
MCH: 30.7 pg (ref 26.0–34.0)
MCHC: 33.8 g/dL (ref 30.0–36.0)
MCV: 90.7 fL (ref 78.0–100.0)
Monocytes Absolute: 1 10*3/uL (ref 0.1–1.0)
Monocytes Relative: 15 %
Neutro Abs: 4.8 10*3/uL (ref 1.7–7.7)
Neutrophils Relative %: 73 %
Platelets: 105 10*3/uL — ABNORMAL LOW (ref 150–400)
RBC: 4.53 MIL/uL (ref 4.22–5.81)
RDW: 15.5 % (ref 11.5–15.5)
WBC: 6.6 10*3/uL (ref 4.0–10.5)

## 2014-11-27 LAB — COMPREHENSIVE METABOLIC PANEL
ALT: 18 U/L (ref 17–63)
AST: 22 U/L (ref 15–41)
Albumin: 2.9 g/dL — ABNORMAL LOW (ref 3.5–5.0)
Alkaline Phosphatase: 28 U/L — ABNORMAL LOW (ref 38–126)
Anion gap: 10 (ref 5–15)
BUN: 17 mg/dL (ref 6–20)
CO2: 22 mmol/L (ref 22–32)
Calcium: 8.8 mg/dL — ABNORMAL LOW (ref 8.9–10.3)
Chloride: 104 mmol/L (ref 101–111)
Creatinine, Ser: 0.81 mg/dL (ref 0.61–1.24)
GFR calc Af Amer: >60 mL/min (ref >60)
GFR calc non Af Amer: >60 mL/min (ref >60)
Glucose, Bld: 116 mg/dL — ABNORMAL HIGH (ref 65–99)
Potassium: 3.9 mmol/L (ref 3.5–5.1)
Sodium: 136 mmol/L (ref 135–145)
Total Bilirubin: 0.7 mg/dL (ref 0.3–1.2)
Total Protein: 5.6 g/dL — ABNORMAL LOW (ref 6.5–8.1)

## 2014-11-27 MED ORDER — ZOLEDRONIC ACID 4 MG/5ML IV CONC
4.0000 mg | Freq: Once | INTRAVENOUS | Status: AC
  Administered 2014-11-27: 4 mg via INTRAVENOUS
  Filled 2014-11-27: qty 5

## 2014-11-27 MED ORDER — SODIUM CHLORIDE 0.9 % IV SOLN
Freq: Once | INTRAVENOUS | Status: AC
  Administered 2014-11-27: 10:00:00 via INTRAVENOUS

## 2014-11-27 MED ORDER — INFLUENZA VAC SPLIT QUAD 0.5 ML IM SUSY
PREFILLED_SYRINGE | INTRAMUSCULAR | Status: AC
  Filled 2014-11-27: qty 0.5

## 2014-11-27 MED ORDER — OXYCODONE-ACETAMINOPHEN 10-325 MG PO TABS: 1.0000 | ORAL_TABLET | ORAL | Status: DC | PRN

## 2014-11-27 MED ORDER — INFLUENZA VAC SPLIT QUAD 0.5 ML IM SUSY
0.5000 mL | PREFILLED_SYRINGE | Freq: Once | INTRAMUSCULAR | Status: AC
  Administered 2014-11-27: 0.5 mL via INTRAMUSCULAR

## 2014-11-27 MED ORDER — DOXEPIN HCL 10 MG PO CAPS: ORAL_CAPSULE | ORAL | Status: DC

## 2014-11-27 NOTE — Progress Notes (Signed)
Tolerated zometa infusion well.

## 2014-11-27 NOTE — Progress Notes (Signed)
Dean Fraction, MD [address] [place] [address]  Multiple Myeloma, IgG kappa  Left occipital condyle infiltrative lesion, not amenable to biopsy. Palliative XRT without a tissue diagnosis  Chest x-ray and right rib films were obtained which showed a soft tissue mass along the right posterior fifth rib with some rib destruction.  Monoclonal IgG kappa, normal IgG levels, suppressed IgM, abnormal kappa/lamda ratio  PET/CT  03/16/2014 Scattered hypermetabolic osseous lesions, including the calvarium, ribs, left scapula, sacrum, and left femoral shaft. An expansile left vertebral body lesion at L3 extends into the epidural space of the left lateral recess, and probably merits lumbar spine MRI assessment.  BMBX on 02/20/2014 with 33% kappa restricted plasma cells Normal FISH, normal cytogenetics  Peripheral evaluation shows very small M spike, normal CBC, normal CMP, myeloma survey grossly underestimates extent of bone involvement  CURRENT THERAPY: RD currently, RVD started on on 04/2014  INTERVAL HISTORY: Dean [name] 79 y.o. male returns for followup of multiple myeloma, monoclonal IgG kappa with scattered osseous lesions  [name]. is here alone today. He has not been feeling well lately. His main complaint are cramps that make it difficult for him to do routine tasks. These cramps affect his legs and hands. He has been drinking the tonic water that [facility] suggested, this alleviates the cramps. He has been drinking this daily. His sleep is affected by these cramps, as well. The patient states that he would feel much better without the cramps.  He takes aspirin daily. He decreased his gabapentin, taking only one in the morning since Saturday, hoping it would help decrease his cramping. He previously decreased his xanax hoping that it would alleviate the cramps. He was previously prescribed a muscle relaxer that helped the first night, but the  following night he took three and they still did not help the cramping.  He states that he has not been eating very much, but notes this is his basline.  [name]. states that he lost his percocet prescription and would like a new one. He also reports that he is out of revlimid and plans to call the company to resolve this.    Multiple myeloma without remission ([place])   11/16/2012 Initial Diagnosis L occipital condyle destructive lesion, not biopsied secondary to location. XRT   12/01/2012 Imaging L3 superior endplate compression FX, no lytic lesions to suggest myeloma   02/06/2014 Imaging soft tissue mass along the right posterior fifth rib with some rib destruction   02/16/2014 Miscellaneous Normal CBC, Normal CMP, kappa,lamda ratio of 2.62 (abnormal), UIEP with slightly restricted band, IgG kappa, SPEP/IEP with monoclonal protein at 0.79 g/dl, normal igG, suppressed IGM   02/20/2014 Bone Marrow Biopsy 33% kappa restricted plasma cells Normal FISH, normal cytogenetics   02/21/2014 - 03/08/2014 Radiation Therapy 30Gy to R rib lesion, lesion not biopsied   03/16/2014 PET scan Scattered hypermetabolic osseous lesions, including the calvarium, ribs, left scapula, sacrum, and left femoral shaft. An expansile left vertebral body lesion at L3 extends into the epidural space of the left lateral recess,    03/17/2014 - 04/07/2014 Chemotherapy Velcade and Dexamethasone due to initial denial of Revlimid by insurance company.  Zometa monthly.   03/22/2014 Imaging MR_ L-Spine- Enhancing lesions compatible with multiple myeloma at L2, L3, and S1-2.   04/07/2014 - 07/28/2014 Chemotherapy RVD with Revlimid at 25 mg days 1-14.  Revlimid dose reduced to 25 mg every other day  x 14 days with 7 day respite beginning on day 1 of cycle 2.   04/17/2014 Adverse Reaction Revlimid-induced rash.  Treated with steroids.   07/28/2014 - 08/04/2014 Chemotherapy Revlimid daily   08/04/2014 Adverse Reaction Velcade-induced peripheral neuropathy    08/04/2014 Treatment Plan Change D/C Velcade   08/11/2014 PET scan Response to therapy. Improvement and resolution of foci of osseous hypermetabolism.   08/22/2014 - 08/28/2014 Chemotherapy Revlimid 25 mg days 1-21 every 28 days   08/28/2014 Adverse Reaction Revlimid-induced rash. Medication held.  Medrol dose Pak prescribed.   09/04/2014 -  Chemotherapy Revlimid 10 mg every other day, without dexamethasone     Past Medical History  Diagnosis Date  . Neuropathy ([place])   . BPH (benign prostatic hyperplasia)   . GERD (gastroesophageal reflux disease)   . H/O ETOH abuse   . Skull lesion     Left occipital condyle  . History of radiation therapy 01/05/13-02/10/13    45 gray to left occipital condyle region  . Multiple myeloma ([place])   . Radiation 02/21/14-03/08/14    right posterior chest wall area 30 gray  . Radiation 04/19/14-05/02/14    lumbar spine 25 gray    has Neuropathy ([facility]); BPH (benign prostatic hyperplasia); GERD (gastroesophageal reflux disease); H/O ETOH abuse; Skull lesion; Neck pain; Fatigue; Loss of weight; Insomnia; Allergic rhinitis; Lumbar compression fracture ([place]); Dermatitis; and Multiple myeloma without remission ([place]) on his problem list.     is allergic to codeine and morphine and related.  Dean [name] does not currently have medications on file.  Past Surgical History  Procedure Laterality Date  . Cyst excision  1959    tail bone    Denies any headaches, dizziness, double vision, fevers, chills, night sweats, nausea, vomiting, diarrhea, constipation, chest pain, heart palpitations, shortness of breath, blood in stool, black tarry stool, urinary pain, urinary burning, urinary frequency, hematuria.  Positive for joint pain.     Leg and hand cramps. Affects his sleep and daily routine.   14 point review of systems was performed and is negative except as detailed under history of present illness and above  PHYSICAL EXAMINATION  ECOG PERFORMANCE STATUS: 0 -  Asymptomatic  Vitals with BMI 11/27/2014  Height   Weight 224 lbs 3 oz  BMI   Systolic 563  Diastolic 65  Pulse 99  Respirations 20   GENERAL:alert, no distress, well nourished, well developed, comfortable, cooperative, obese and smiling SKIN: skin color, texture, turgor are normal, no rashes or significant lesions HEAD: Normocephalic, No masses, lesions, tenderness or abnormalities EYES: normal, PERRLA, EOMI, Conjunctiva are pink and non-injected. Wears dark sunglasses all the time EARS: External ears normal OROPHARYNX:lips, buccal mucosa, and tongue normal and mucous membranes are moist  NECK: supple, no adenopathy, thyroid normal size, non-tender, without nodularity, no stridor, non-tender, trachea midline LYMPH:  no palpable lymphadenopathy BREAST:not examined LUNGS: clear to auscultation  HEART: regular rate & rhythm, no murmurs and no gallops ABDOMEN:abdomen soft, non-tender, obese and normal bowel sounds BACK: Back symmetric, no curvature., No CVA tenderness EXTREMITIES:less then 2 second capillary refill, no joint deformities, effusion, or inflammation, no edema, no skin discoloration, no clubbing, no cyanosis  NEURO: alert & oriented x 3 with fluent speech, no focal motor/sensory deficits, gait normal   LABORATORY DATA: I have reviewed the labs below. CBC    Component Value Date/Time   WBC 7.0 10/27/2014 0839   RBC 4.66 10/27/2014 0839   HGB 13.9 10/27/2014 0839   HCT 41.6 10/27/2014 0839  PLT 136* 10/27/2014 0839   MCV 89.3 10/27/2014 0839   MCH 29.8 10/27/2014 0839   MCHC 33.4 10/27/2014 0839   RDW 15.5 10/27/2014 0839   LYMPHSABS 1.2 10/27/2014 0839   MONOABS 1.2* 10/27/2014 0839   EOSABS 0.1 10/27/2014 0839   BASOSABS 0.0 10/27/2014 0839      Chemistry      Component Value Date/Time   NA 139 10/27/2014 0839   K 3.7 10/27/2014 0839   CL 110 10/27/2014 0839   CO2 24 10/27/2014 0839   BUN 18 10/27/2014 0839   CREATININE 0.73 10/27/2014 0839    CREATININE 0.76 02/06/2014 1249      Component Value Date/Time   CALCIUM 8.1* 10/27/2014 0839   ALKPHOS 25* 10/27/2014 0839   AST 23 10/27/2014 0839   ALT 16* 10/27/2014 0839   BILITOT 0.6 10/27/2014 0839     Results for Dean [name], Dean [name] (MRN [id number])   Ref. Range 11/27/2014 09:15 11/27/2014 09:15  Total Protein ELP Latest Ref Range: 6.0-8.5 g/dL 4.8 (L) 5.0 (L)  Albumin ELP Latest Ref Range: 2.9-4.4 g/dL 2.5 (L)   Globulin, Total Latest Ref Range: 2.2-3.9 g/dL 2.3   A/G Ratio Latest Ref Range: 0.7-1.7  1.1   Alpha-1 Glubulin Latest Ref Range: 0.0-0.4 g/dL 0.3   Alpha-2 Globulin Latest Ref Range: 0.4-1.0 g/dL 0.8   Beta Globulin Latest Ref Range: 0.7-1.3 g/dL 0.8   Gamma Globulin Latest Ref Range: 0.4-1.8 g/dL 0.4   M-SPIKE, % Latest Ref Range: Not Observed g/dL Not Observed   SPE Interp. Unknown Comment   Comment Unknown Comment   IgG (Immunoglobin G), Serum Latest Ref Range: [phone] mg/dL 428 (L) 411 (L)  IgA Latest Ref Range: 61-437 mg/dL 89 90  IgM, Serum Latest Ref Range: 15-143 mg/dL 20 20  Kappa free light chain Latest Ref Range: 3.30-19.40 mg/L 21.04 (H)   Lamda free light chains Latest Ref Range: 5.71-26.30 mg/L 18.14   Kappa, lamda light chain ratio Latest Ref Range: 0.26-1.65  1.16     ASSESSMENT AND PLAN:  IgG kappa myeloma Multiple bone lesions seen on PET imaging RVD therapy Rash on revlimid, currently on every other day dosing Insomnia Hypocalcemia   M spike is currently undetectable. Kappa/Lambda light chain ratio over time has been stable as well. The best way to follow his disease is via PET imaging. Last PET was on 7/8 with improvement/resolution of disease.  We have ordered repeat PET imaging in December. He will follow-up with me after imaging to review and for additional recommendations.  He has completed 6 cycles of RVD. Currently he continues on revlimid, but is only willing to take it every other day.  He is still on weekly dexamethasone which he  will divide into 20 mg on Tuesday and 20 mg on Friday.  He states he cannot tolerate it all in one day. He is certainly doing well.   We will continue the monthly Zometa, calcium level permitting. He will continue on his current calcium and vitamin D dosing.  I have written [name]. a new pain prescription as he lost his last issued one. I did check the [place] narcotics database and he has not had any filled in the interim.   He will receive his flu vaccination today.   I will see [name]. again in one month to discuss PET scan results.  All questions were answered. The patient knows to call the clinic with any problems, questions or concerns. We can certainly  see the patient much sooner if necessary.  This note is electronically signed.  This document serves as a record of services personally performed by Dean Linsey, MD. It was created on her behalf by Arlyce Harman, a trained medical scribe. The creation of this record is based on the scribe's personal observations and the provider's statements to them. This document has been checked and approved by the attending provider.  I have reviewed the above documentation for accuracy and completeness, and I agree with the above.  Dean Fam. Mckensi Redinger MD

## 2014-11-27 NOTE — Patient Instructions (Signed)
[place] at [facility] Discharge Instructions  RECOMMENDATIONS MADE BY THE CONSULTANT AND ANY TEST RESULTS WILL BE SENT TO YOUR REFERRING PHYSICIAN.  Exam and discussion by Dr. Whitney Muse If labs ok will give zometa Call with any concerns or issues  Follow-up: Labs and office visit after PET scan.  Thank you for choosing [place] at [facility] to provide your oncology and hematology care.  To afford each patient quality time with our provider, please arrive at least 15 minutes before your scheduled appointment time.    You need to re-schedule your appointment should you arrive 10 or more minutes late.  We strive to give you quality time with our providers, and arriving late affects you and other patients whose appointments are after yours.  Also, if you no show three or more times for appointments you may be dismissed from the clinic at the providers discretion.     Again, thank you for choosing [facility].  Our hope is that these requests will decrease the amount of time that you wait before being seen by our physicians.       _____________________________________________________________  Should you have questions after your visit to [facility], please contact our office at [phone] between the hours of 8:30 a.m. and 4:30 p.m.  Voicemails left after 4:30 p.m. will not be returned until the following business day.  For prescription refill requests, have your pharmacy contact our office.

## 2014-11-27 NOTE — Progress Notes (Signed)
Dean [name] presents today for injection per MD orders. Flu vaccine administered IM in left deltoid. Administration without incident. Patient tolerated well.

## 2014-11-28 LAB — IGG, IGA, IGM
IgA: 89 mg/dL (ref 61–437)
IgG (Immunoglobin G), Serum: 428 mg/dL — ABNORMAL LOW (ref 700–1600)
IgM, Serum: 20 mg/dL (ref 15–143)

## 2014-11-28 LAB — PROTEIN ELECTROPHORESIS, SERUM
A/G Ratio: 1.1 (ref 0.7–1.7)
Albumin ELP: 2.5 g/dL — ABNORMAL LOW (ref 2.9–4.4)
Alpha-1-Globulin: 0.3 g/dL (ref 0.0–0.4)
Alpha-2-Globulin: 0.8 g/dL (ref 0.4–1.0)
Beta Globulin: 0.8 g/dL (ref 0.7–1.3)
Gamma Globulin: 0.4 g/dL (ref 0.4–1.8)
Globulin, Total: 2.3 g/dL (ref 2.2–3.9)
Total Protein ELP: 4.8 g/dL — ABNORMAL LOW (ref 6.0–8.5)

## 2014-11-28 LAB — IMMUNOFIXATION ELECTROPHORESIS
IgA: 90 mg/dL (ref 61–437)
IgG (Immunoglobin G), Serum: 411 mg/dL — ABNORMAL LOW (ref 700–1600)
IgM, Serum: 20 mg/dL (ref 15–143)
Total Protein ELP: 5 g/dL — ABNORMAL LOW (ref 6.0–8.5)

## 2014-11-28 LAB — KAPPA/LAMBDA LIGHT CHAINS
Kappa free light chain: 21.04 mg/L — ABNORMAL HIGH (ref 3.30–19.40)
Kappa, lambda light chain ratio: 1.16 (ref 0.26–1.65)
Lambda free light chains: 18.14 mg/L (ref 5.71–26.30)

## 2014-12-12 ENCOUNTER — Telehealth: Payer: Self-pay | Admitting: Oncology

## 2014-12-12 ENCOUNTER — Other Ambulatory Visit (HOSPITAL_COMMUNITY): Payer: Self-pay

## 2014-12-12 ENCOUNTER — Other Ambulatory Visit (HOSPITAL_COMMUNITY): Payer: Self-pay | Admitting: Hematology & Oncology

## 2014-12-12 DIAGNOSIS — C9000 Multiple myeloma not having achieved remission: Secondary | ICD-10-CM

## 2014-12-12 DIAGNOSIS — G4700 Insomnia, unspecified: Secondary | ICD-10-CM

## 2014-12-12 MED ORDER — DEXAMETHASONE 4 MG PO TABS: ORAL_TABLET | ORAL | Status: DC

## 2014-12-12 MED ORDER — ALPRAZOLAM 1 MG PO TABS: 1.0000 mg | ORAL_TABLET | Freq: Every evening | ORAL | Status: DC | PRN

## 2014-12-12 NOTE — Telephone Encounter (Signed)
[place] regarding the refill request received for alprazolam.  Advised the pharmacist that Dr. Whitney Muse had filled the prescription last on 04/07/14.  [facility] said they would contact Dr. Donald Pore office.

## 2014-12-19 ENCOUNTER — Telehealth (HOSPITAL_COMMUNITY): Payer: Self-pay

## 2014-12-19 ENCOUNTER — Other Ambulatory Visit (HOSPITAL_COMMUNITY): Payer: Self-pay | Admitting: Oncology

## 2014-12-19 NOTE — Telephone Encounter (Signed)
Spoke with Dean [name] and he is taking his Revlimid every other day.

## 2014-12-20 ENCOUNTER — Other Ambulatory Visit (HOSPITAL_COMMUNITY): Payer: Self-pay | Admitting: Oncology

## 2014-12-20 DIAGNOSIS — C9000 Multiple myeloma not having achieved remission: Secondary | ICD-10-CM

## 2014-12-20 MED ORDER — LENALIDOMIDE 10 MG PO CAPS: 10.0000 mg | ORAL_CAPSULE | ORAL | Status: DC

## 2014-12-22 ENCOUNTER — Ambulatory Visit (HOSPITAL_COMMUNITY)
Admission: RE | Admit: 2014-12-22 | Discharge: 2014-12-22 | Disposition: A | Discharge status: Home / Self Care | Payer: Medicare Other | Source: Ambulatory Visit | Attending: Hematology & Oncology | Admitting: Hematology & Oncology

## 2014-12-22 ENCOUNTER — Other Ambulatory Visit (HOSPITAL_COMMUNITY): Payer: Self-pay | Admitting: Hematology & Oncology

## 2014-12-22 ENCOUNTER — Other Ambulatory Visit (HOSPITAL_COMMUNITY): Payer: Self-pay | Admitting: *Deleted

## 2014-12-22 DIAGNOSIS — C9000 Multiple myeloma not having achieved remission: Secondary | ICD-10-CM

## 2014-12-22 DIAGNOSIS — M899 Disorder of bone, unspecified: Secondary | ICD-10-CM | POA: Diagnosis not present

## 2014-12-22 DIAGNOSIS — Z85830 Personal history of malignant neoplasm of bone: Secondary | ICD-10-CM | POA: Insufficient documentation

## 2014-12-22 DIAGNOSIS — K5792 Diverticulitis of intestine, part unspecified, without perforation or abscess without bleeding: Secondary | ICD-10-CM

## 2014-12-22 DIAGNOSIS — K639 Disease of intestine, unspecified: Secondary | ICD-10-CM | POA: Insufficient documentation

## 2014-12-22 DIAGNOSIS — Z08 Encounter for follow-up examination after completed treatment for malignant neoplasm: Secondary | ICD-10-CM | POA: Insufficient documentation

## 2014-12-22 HISTORY — DX: Diverticulitis of intestine, part unspecified, without perforation or abscess without bleeding: K57.92

## 2014-12-22 LAB — GLUCOSE, CAPILLARY: Glucose-Capillary: 86 mg/dL (ref 65–99)

## 2014-12-22 MED ORDER — FLUDEOXYGLUCOSE F - 18 (FDG) INJECTION
11.9000 | Freq: Once | INTRAVENOUS | Status: DC | PRN
  Administered 2014-12-22: 11.9 via INTRAVENOUS
  Filled 2014-12-22: qty 11.9

## 2014-12-22 MED ORDER — METRONIDAZOLE 500 MG PO TABS: 500.0000 mg | ORAL_TABLET | Freq: Three times a day (TID) | ORAL | Status: DC

## 2014-12-22 MED ORDER — CIPROFLOXACIN HCL 500 MG PO TABS: 500.0000 mg | ORAL_TABLET | Freq: Two times a day (BID) | ORAL | Status: DC

## 2014-12-25 ENCOUNTER — Encounter (HOSPITAL_COMMUNITY): Payer: Medicare Other | Attending: Hematology and Oncology | Admitting: Hematology & Oncology

## 2014-12-25 ENCOUNTER — Other Ambulatory Visit (HOSPITAL_COMMUNITY): Payer: Medicare Other

## 2014-12-25 ENCOUNTER — Encounter (HOSPITAL_BASED_OUTPATIENT_CLINIC_OR_DEPARTMENT_OTHER): Payer: Medicare Other

## 2014-12-25 ENCOUNTER — Ambulatory Visit (HOSPITAL_COMMUNITY): Payer: Medicare Other

## 2014-12-25 ENCOUNTER — Encounter (HOSPITAL_COMMUNITY): Payer: Self-pay | Admitting: Hematology & Oncology

## 2014-12-25 VITALS — BP 119/75 | HR 86 | Temp 97.3°F | Resp 16 | Wt 222.3 lb

## 2014-12-25 DIAGNOSIS — K5700 Diverticulitis of small intestine with perforation and abscess without bleeding: Secondary | ICD-10-CM

## 2014-12-25 DIAGNOSIS — R634 Abnormal weight loss: Secondary | ICD-10-CM | POA: Insufficient documentation

## 2014-12-25 DIAGNOSIS — C9000 Multiple myeloma not having achieved remission: Secondary | ICD-10-CM

## 2014-12-25 DIAGNOSIS — N400 Enlarged prostate without lower urinary tract symptoms: Secondary | ICD-10-CM | POA: Diagnosis not present

## 2014-12-25 DIAGNOSIS — D472 Monoclonal gammopathy: Secondary | ICD-10-CM | POA: Diagnosis not present

## 2014-12-25 DIAGNOSIS — Z923 Personal history of irradiation: Secondary | ICD-10-CM | POA: Insufficient documentation

## 2014-12-25 DIAGNOSIS — R630 Anorexia: Secondary | ICD-10-CM | POA: Insufficient documentation

## 2014-12-25 DIAGNOSIS — M899 Disorder of bone, unspecified: Secondary | ICD-10-CM | POA: Insufficient documentation

## 2014-12-25 DIAGNOSIS — K219 Gastro-esophageal reflux disease without esophagitis: Secondary | ICD-10-CM | POA: Diagnosis not present

## 2014-12-25 DIAGNOSIS — M545 Low back pain: Secondary | ICD-10-CM

## 2014-12-25 DIAGNOSIS — M542 Cervicalgia: Secondary | ICD-10-CM | POA: Diagnosis not present

## 2014-12-25 LAB — HEPATIC FUNCTION PANEL
ALT: 21 U/L (ref 17–63)
AST: 22 U/L (ref 15–41)
Albumin: 2.9 g/dL — ABNORMAL LOW (ref 3.5–5.0)
Alkaline Phosphatase: 27 U/L — ABNORMAL LOW (ref 38–126)
Bilirubin, Direct: 0.1 mg/dL (ref 0.1–0.5)
Indirect Bilirubin: 0.4 mg/dL (ref 0.3–0.9)
Total Bilirubin: 0.5 mg/dL (ref 0.3–1.2)
Total Protein: 5.7 g/dL — ABNORMAL LOW (ref 6.5–8.1)

## 2014-12-25 LAB — BASIC METABOLIC PANEL
Anion gap: 7 (ref 5–15)
BUN: 22 mg/dL — ABNORMAL HIGH (ref 6–20)
CO2: 26 mmol/L (ref 22–32)
Calcium: 8.4 mg/dL — ABNORMAL LOW (ref 8.9–10.3)
Chloride: 109 mmol/L (ref 101–111)
Creatinine, Ser: 0.77 mg/dL (ref 0.61–1.24)
GFR calc Af Amer: >60 mL/min (ref >60)
GFR calc non Af Amer: >60 mL/min (ref >60)
Glucose, Bld: 83 mg/dL (ref 65–99)
Potassium: 4.3 mmol/L (ref 3.5–5.1)
Sodium: 142 mmol/L (ref 135–145)

## 2014-12-25 MED ORDER — SODIUM CHLORIDE 0.9 % IV SOLN
Freq: Once | INTRAVENOUS | Status: AC
  Administered 2014-12-25: 11:00:00 via INTRAVENOUS

## 2014-12-25 MED ORDER — OXYCODONE-ACETAMINOPHEN 10-325 MG PO TABS: 1.0000 | ORAL_TABLET | ORAL | Status: DC | PRN

## 2014-12-25 MED ORDER — ZOLEDRONIC ACID 4 MG/5ML IV CONC
4.0000 mg | Freq: Once | INTRAVENOUS | Status: AC
  Administered 2014-12-25: 4 mg via INTRAVENOUS
  Filled 2014-12-25: qty 5

## 2014-12-25 MED ORDER — SODIUM CHLORIDE 0.9 % IJ SOLN
10.0000 mL | INTRAMUSCULAR | Status: DC | PRN
  Administered 2014-12-25: 10 mL
  Filled 2014-12-25: qty 10

## 2014-12-25 NOTE — Progress Notes (Signed)
Tolerated zometa infusion well.

## 2014-12-25 NOTE — Progress Notes (Signed)
Dean Fraction, MD [address] [place] [address]  Multiple Myeloma, IgG kappa  Left occipital condyle infiltrative lesion, not amenable to biopsy. Palliative XRT without a tissue diagnosis  Chest x-ray and right rib films were obtained which showed a soft tissue mass along the right posterior fifth rib with some rib destruction.  Monoclonal IgG kappa, normal IgG levels, suppressed IgM, abnormal kappa/lamda ratio  PET/CT  03/16/2014 Scattered hypermetabolic osseous lesions, including the calvarium, ribs, left scapula, sacrum, and left femoral shaft. An expansile left vertebral body lesion at L3 extends into the epidural space of the left lateral recess, and probably merits lumbar spine MRI assessment.  BMBX on 02/20/2014 with 33% kappa restricted plasma cells Normal FISH, normal cytogenetics  Peripheral evaluation shows very small M spike, normal CBC, normal CMP, myeloma survey grossly underestimates extent of bone involvement  CURRENT THERAPY: RD currently, RVD started on on 04/2014  INTERVAL HISTORY: Dean [name] 79 y.o. male returns for followup of multiple myeloma, monoclonal IgG kappa with scattered osseous lesions  Dean [name] is accompanied by his wife and here today to discuss his recent PET scan results. The results included evidence of diverticulitis, he denies stomach issues. He denies any pain or change in bowel habits.   He states that his appetite is fair. He continues to have problems sleeping. His bowels are okay. He continues to take his antibiotics that were called in when his PET results were received.  Denies diarrhea, constipation, or vomiting.  He is concerned that he has been short tempered with his wife lately and questions if any of his medications could have caused this. We discussed that the pain medication may be causing this.  He has a living will.    Multiple myeloma without remission ([place])   11/16/2012 Initial  Diagnosis L occipital condyle destructive lesion, not biopsied secondary to location. XRT   12/01/2012 Imaging L3 superior endplate compression FX, no lytic lesions to suggest myeloma   02/06/2014 Imaging soft tissue mass along the right posterior fifth rib with some rib destruction   02/16/2014 Miscellaneous Normal CBC, Normal CMP, kappa,lamda ratio of 2.62 (abnormal), UIEP with slightly restricted band, IgG kappa, SPEP/IEP with monoclonal protein at 0.79 g/dl, normal igG, suppressed IGM   02/20/2014 Bone Marrow Biopsy 33% kappa restricted plasma cells Normal FISH, normal cytogenetics   02/21/2014 - 03/08/2014 Radiation Therapy 30Gy to R rib lesion, lesion not biopsied   03/16/2014 PET scan Scattered hypermetabolic osseous lesions, including the calvarium, ribs, left scapula, sacrum, and left femoral shaft. An expansile left vertebral body lesion at L3 extends into the epidural space of the left lateral recess,    03/17/2014 - 04/07/2014 Chemotherapy Velcade and Dexamethasone due to initial denial of Revlimid by insurance company.  Zometa monthly.   03/22/2014 Imaging MR_ L-Spine- Enhancing lesions compatible with multiple myeloma at L2, L3, and S1-2.   04/07/2014 - 07/28/2014 Chemotherapy RVD with Revlimid at 25 mg days 1-14.  Revlimid dose reduced to 25 mg every other day x 14 days with 7 day respite beginning on day 1 of cycle 2.   04/17/2014 Adverse Reaction Revlimid-induced rash.  Treated with steroids.   07/28/2014 - 08/04/2014 Chemotherapy Revlimid daily   08/04/2014 Adverse Reaction Velcade-induced peripheral neuropathy   08/04/2014 Treatment Plan Change D/C Velcade   08/11/2014 PET scan Response to therapy. Improvement and resolution of foci of osseous hypermetabolism.   08/22/2014 - 08/28/2014 Chemotherapy Revlimid 25 mg days  1-21 every 28 days   08/28/2014 Adverse Reaction Revlimid-induced rash. Medication held.  Medrol dose Pak prescribed.   09/04/2014 -  Chemotherapy Revlimid 10 mg every other day, without  dexamethasone     Past Medical History  Diagnosis Date  . Neuropathy ([place])   . BPH (benign prostatic hyperplasia)   . GERD (gastroesophageal reflux disease)   . H/O ETOH abuse   . Skull lesion     Left occipital condyle  . History of radiation therapy 01/05/13-02/10/13    45 gray to left occipital condyle region  . Multiple myeloma ([place])   . Radiation 02/21/14-03/08/14    right posterior chest wall area 30 gray  . Radiation 04/19/14-05/02/14    lumbar spine 25 gray  . Diverticulitis 12/22/14    has Neuropathy ([place]); BPH (benign prostatic hyperplasia); GERD (gastroesophageal reflux disease); H/O ETOH abuse; Skull lesion; Neck pain; Fatigue; Loss of weight; Insomnia; Allergic rhinitis; Lumbar compression fracture ([place]); Dermatitis; and Multiple myeloma without remission ([place]) on his problem list.     is allergic to codeine and morphine and related.  Dean [name] does not currently have medications on file.  Past Surgical History  Procedure Laterality Date  . Cyst excision  1959    tail bone    Denies any headaches, dizziness, double vision, fevers, chills, night sweats, nausea, vomiting, diarrhea, constipation, chest pain, heart palpitations, shortness of breath, blood in stool, black tarry stool, urinary pain, urinary burning, urinary frequency, hematuria. Positive for trouble sleeping.     Unchanged  14 point review of systems was performed and is negative except as detailed under history of present illness and above  PHYSICAL EXAMINATION  ECOG PERFORMANCE STATUS: 0 - Asymptomatic   Vitals with BMI 12/25/2014  Height   Weight 222 lbs 5 oz  BMI   Systolic 017  Diastolic 75  Pulse 86  Respirations 16    GENERAL:alert, no distress, well nourished, well developed, comfortable, cooperative, obese and smiling SKIN: skin color, texture, turgor are normal, no rashes or significant lesions HEAD: Normocephalic, No masses, lesions, tenderness or abnormalities EYES: normal, PERRLA,  EOMI, Conjunctiva are pink and non-injected. Wears dark sunglasses all the time EARS: External ears normal OROPHARYNX:lips, buccal mucosa, and tongue normal and mucous membranes are moist  NECK: supple, no adenopathy, thyroid normal size, non-tender, without nodularity, no stridor, non-tender, trachea midline LYMPH:  no palpable lymphadenopathy BREAST:not examined LUNGS: clear to auscultation  HEART: regular rate & rhythm, no murmurs and no gallops ABDOMEN:abdomen soft, non-tender, obese and normal bowel sounds BACK: Back symmetric, no curvature., No CVA tenderness EXTREMITIES:less then 2 second capillary refill, no joint deformities, effusion, or inflammation, no edema, no skin discoloration, no clubbing, no cyanosis  NEURO: alert & oriented x 3 with fluent speech, no focal motor/sensory deficits, gait normal   LABORATORY DATA: I have reviewed the labs below. CBC    Component Value Date/Time   WBC 6.6 11/27/2014 0915   RBC 4.53 11/27/2014 0915   HGB 13.9 11/27/2014 0915   HCT 41.1 11/27/2014 0915   PLT 105* 11/27/2014 0915   MCV 90.7 11/27/2014 0915   MCH 30.7 11/27/2014 0915   MCHC 33.8 11/27/2014 0915   RDW 15.5 11/27/2014 0915   LYMPHSABS 0.8 11/27/2014 0915   MONOABS 1.0 11/27/2014 0915   EOSABS 0.0 11/27/2014 0915   BASOSABS 0.0 11/27/2014 0915      Chemistry      Component Value Date/Time   NA 142 12/25/2014 0933   K 4.3 12/25/2014 0933  CL 109 12/25/2014 0933   CO2 26 12/25/2014 0933   BUN 22* 12/25/2014 0933   CREATININE 0.77 12/25/2014 0933   CREATININE 0.76 02/06/2014 1249      Component Value Date/Time   CALCIUM 8.4* 12/25/2014 0933   ALKPHOS 27* 12/25/2014 1020   AST 22 12/25/2014 1020   ALT 21 12/25/2014 1020   BILITOT 0.5 12/25/2014 1020     Lab Results  Component Value Date   PROT 5.7* 12/25/2014   ALBUMINELP 2.5* 11/27/2014   A1GS 0.3 11/27/2014   A2GS 0.8 11/27/2014   BETS 0.8 11/27/2014   BETA2SER 5.3 04/07/2014   GAMS 0.4 11/27/2014    MSPIKE Not Observed 11/27/2014   SPEI Comment 11/27/2014   SPECOM Comment 11/27/2014   IGGSERUM 428* 11/27/2014   IGGSERUM 411* 11/27/2014   IGA 89 11/27/2014   IGA 90 11/27/2014   IGMSERUM 20 11/27/2014   IGMSERUM 20 11/27/2014   IMMELINT (NOTE) 04/07/2014   KPAFRELGTCHN 21.04* 11/27/2014   LAMBDASER 18.14 11/27/2014   KAPLAMBRATIO 1.16 11/27/2014   RADIOGRAPHY: I have personally reviewed the radiological images as listed and agreed with the findings in the report.  CLINICAL DATA: Subsequent treatment strategy for multiple myeloma.  EXAM: NUCLEAR MEDICINE PET WHOLE BODY  TECHNIQUE: 11.9 mCi F-18 FDG was injected intravenously. Full-ring PET imaging was performed from the vertex to the feet after the radiotracer. CT data was obtained and used for attenuation correction and anatomic localization.  FASTING BLOOD GLUCOSE: Value: 86 mg/dl  COMPARISON: PET-CT 08/11/2014 and 03/16/2014.  FINDINGS: NECK  No hypermetabolic cervical lymph nodes are identified.There are no lesions of the pharyngeal mucosal space. There is stable mild symmetric prominence of activity within the submandibular glands bilaterally.  CHEST  There are no hypermetabolic mediastinal, hilar or axillary lymph nodes. Interval significant improvement in the inflammatory changes in the right upper lobe with associated hypermetabolic activity. There are some residual linear components without hypermetabolic activity. Within the right lower lobe, there is low-level hypermetabolic activity associated with subpleural ground-glass density. This has an SUV max of 5.8 and appears inflammatory. No suspicious pulmonary findings.  ABDOMEN/PELVIS  There is no hypermetabolic activity within the liver, adrenal glands, spleen or pancreas. There is no hypermetabolic nodal activity. There is extensive sigmoid diverticulosis with increased wall thickening and associated hypermetabolic activity. There is  a new small lenticular shaped fluid collection with air bubbles beneath the left anterior abdominal wall, measuring 5.9 x 1.7 cm on image 195. This demonstrates hyper metabolism and is suspicious for a small abscess. No free intraperitoneal air or evidence of bowel obstruction. Moderate enlargement of the prostate gland, diffuse atherosclerosis and bilateral renal cortical and parapelvic cysts are again noted.  SKELETON  There is decreased metabolic activity throughout the lumbar spine attributed to radiation therapy. There is mild mottled activity throughout the skeleton without focally increased activity to suggest residual active multiple myeloma. The right sacral lesion measures 2.3 cm on image 192 and has no residual hypermetabolic activity (SUV max 2.5). The L3 lesion appears unchanged in size without residual hypermetabolic activity. Right rib deformities are noted without abnormal metabolic activity.  EXTREMITIES  No hypermetabolic activity to suggest metastasis.  IMPRESSION: 1. No evidence of residual metabolically active multiple myeloma in the axial or appendicular skeleton. The dominant lesions in the L3 vertebral body and right sacrum have not changed in size. 2. No evidence of extra osseous neoplasm. 3. Interval improvement in inflammatory changes in the right lung, possibly related to previous radiation therapy. 4.  New hypermetabolism in the sigmoid colon associated with changes of probable diverticulitis and a small peridiverticular abscess. Bowel rest and antibiotic therapy recommended. 5. These results will be called to the ordering clinician or representative by the Radiologist Assistant, and communication documented in the PACS or zVision Dashboard.   Electronically Signed  By: Richardean Sale M.D.  On: 12/22/2014 11:11  ASSESSMENT AND PLAN:  IgG kappa myeloma Multiple bone lesions seen on PET imaging RVD therapy Rash on revlimid, currently  on every other day dosing Insomnia Hypocalcemia Diverticulitis/peridiverticular abscess seen on PET imaging, patient asymptomatic  Unfortunately he has had trouble with Revlimid, causing a  rash.  He has been very reluctant to work with me on trying daily dosing. I have had many patients whose rash resolves with appropriate management over time.  M spike on 10/28 was not detectable.   Kappa/Lambda light chain ratio over time has been stable as well. The best way to follow his disease is via PET imaging. PET imaging is detailed above, in regards to his myeloma it is markedly improved.   We will continue the monthly Zometa, calcium level permitting. He will continue on his current calcium and vitamin D dosing.  We will order repeat imaging of the abdomen. He is to continue his course of antibiotics.  I am referring him to GI; he was apprehensive at first but agrees to a referral.  I have refilled his pain medication today.  I will advise him of the results of his repeat abdominal imaging with additional recommendations at that time.  He is to notify us if he develops any abdominal symptoms or fever. He will return to the clinic in January for ongoing follow-up of his myeloma.  All questions were answered. The patient knows to call the clinic with any problems, questions or concerns. We can certainly see the patient much sooner if necessary.   This note is electronically signed.  This document serves as a record of services personally performed by Ancil Linsey, MD. It was created on her behalf by Arlyce Harman, a trained medical scribe. The creation of this record is based on the scribe's personal observations and the provider's statements to them. This document has been checked and approved by the attending provider.  I have reviewed the above documentation for accuracy and completeness, and I agree with the above.  Kelby Fam. Tupac Jeffus MD

## 2014-12-25 NOTE — Progress Notes (Signed)
Labs drawn

## 2014-12-25 NOTE — Patient Instructions (Addendum)
[place] at [facility] Discharge Instructions  RECOMMENDATIONS MADE BY THE CONSULTANT AND ANY TEST RESULTS WILL BE SENT TO YOUR REFERRING PHYSICIAN.   Exam completed by Dr Whitney Muse today Zometa today Continue taking Calcium with D Zometa every 4 weeks Prescription for your pain medication given PET scan looked good as far as myeloma is concerned, You do have some diverticulitis GI consult, they will call with an appointment  We will get you scheduled for scans Return to see the doctor and have lab work in January Please call the clinic if you have any questions or concerns.   Thank you for choosing [place] [facility] to provide your oncology and hematology care.  To afford each patient quality time with our provider, please arrive at least 15 minutes before your scheduled appointment time.    You need to re-schedule your appointment should you arrive 10 or more minutes late.  We strive to give you quality time with our providers, and arriving late affects you and other patients whose appointments are after yours.  Also, if you no show three or more times for appointments you may be dismissed from the clinic at the providers discretion.     Again, thank you for choosing [facility].  Our hope is that these requests will decrease the amount of time that you wait before being seen by our physicians.       _____________________________________________________________  Should you have questions after your visit to [facility], please contact our office at [phone] between the hours of 8:30 a.m. and 4:30 p.m.  Voicemails left after 4:30 p.m. will not be returned until the following business day.  For prescription refill requests, have your pharmacy contact our office.

## 2014-12-29 ENCOUNTER — Encounter (HOSPITAL_COMMUNITY): Payer: Self-pay | Admitting: Hematology & Oncology

## 2015-01-03 ENCOUNTER — Ambulatory Visit: Payer: Medicare Other | Admitting: Family Medicine

## 2015-01-08 ENCOUNTER — Inpatient Hospital Stay (HOSPITAL_COMMUNITY)
Admission: EM | Admit: 2015-01-08 | Discharge: 2015-01-15 | DRG: 392 | Disposition: A | Discharge status: Home Health | Payer: Medicare Other | Attending: Family Medicine | Admitting: Family Medicine

## 2015-01-08 ENCOUNTER — Encounter (HOSPITAL_COMMUNITY): Payer: Self-pay | Admitting: Emergency Medicine

## 2015-01-08 ENCOUNTER — Emergency Department (HOSPITAL_COMMUNITY)
Admission: EM | Admit: 2015-01-08 | Discharge: 2015-01-08 | Discharge status: Absent without leave | Payer: Medicare Other

## 2015-01-08 ENCOUNTER — Ambulatory Visit (HOSPITAL_COMMUNITY)
Admission: RE | Admit: 2015-01-08 | Discharge: 2015-01-08 | Disposition: A | Discharge status: Home / Self Care | Payer: Medicare Other | Source: Ambulatory Visit | Attending: Hematology & Oncology | Admitting: Hematology & Oncology

## 2015-01-08 DIAGNOSIS — K5720 Diverticulitis of large intestine with perforation and abscess without bleeding: Secondary | ICD-10-CM | POA: Diagnosis not present

## 2015-01-08 DIAGNOSIS — K219 Gastro-esophageal reflux disease without esophagitis: Secondary | ICD-10-CM | POA: Diagnosis present

## 2015-01-08 DIAGNOSIS — G4700 Insomnia, unspecified: Secondary | ICD-10-CM | POA: Diagnosis present

## 2015-01-08 DIAGNOSIS — D72819 Decreased white blood cell count, unspecified: Secondary | ICD-10-CM | POA: Diagnosis present

## 2015-01-08 DIAGNOSIS — Z833 Family history of diabetes mellitus: Secondary | ICD-10-CM | POA: Diagnosis not present

## 2015-01-08 DIAGNOSIS — Z823 Family history of stroke: Secondary | ICD-10-CM | POA: Diagnosis not present

## 2015-01-08 DIAGNOSIS — D696 Thrombocytopenia, unspecified: Secondary | ICD-10-CM | POA: Diagnosis not present

## 2015-01-08 DIAGNOSIS — K5700 Diverticulitis of small intestine with perforation and abscess without bleeding: Secondary | ICD-10-CM

## 2015-01-08 DIAGNOSIS — Z8249 Family history of ischemic heart disease and other diseases of the circulatory system: Secondary | ICD-10-CM

## 2015-01-08 DIAGNOSIS — Z7952 Long term (current) use of systemic steroids: Secondary | ICD-10-CM

## 2015-01-08 DIAGNOSIS — N400 Enlarged prostate without lower urinary tract symptoms: Secondary | ICD-10-CM | POA: Diagnosis present

## 2015-01-08 DIAGNOSIS — Z7982 Long term (current) use of aspirin: Secondary | ICD-10-CM

## 2015-01-08 DIAGNOSIS — T451X5A Adverse effect of antineoplastic and immunosuppressive drugs, initial encounter: Secondary | ICD-10-CM | POA: Diagnosis present

## 2015-01-08 DIAGNOSIS — K651 Peritoneal abscess: Secondary | ICD-10-CM | POA: Diagnosis not present

## 2015-01-08 DIAGNOSIS — C9000 Multiple myeloma not having achieved remission: Secondary | ICD-10-CM | POA: Diagnosis present

## 2015-01-08 DIAGNOSIS — Z885 Allergy status to narcotic agent status: Secondary | ICD-10-CM | POA: Diagnosis not present

## 2015-01-08 DIAGNOSIS — IMO0002 Reserved for concepts with insufficient information to code with codable children: Secondary | ICD-10-CM

## 2015-01-08 DIAGNOSIS — K5732 Diverticulitis of large intestine without perforation or abscess without bleeding: Secondary | ICD-10-CM | POA: Diagnosis not present

## 2015-01-08 DIAGNOSIS — Z79899 Other long term (current) drug therapy: Secondary | ICD-10-CM

## 2015-01-08 DIAGNOSIS — Z923 Personal history of irradiation: Secondary | ICD-10-CM | POA: Diagnosis not present

## 2015-01-08 DIAGNOSIS — K5780 Diverticulitis of intestine, part unspecified, with perforation and abscess without bleeding: Secondary | ICD-10-CM | POA: Diagnosis not present

## 2015-01-08 DIAGNOSIS — G629 Polyneuropathy, unspecified: Secondary | ICD-10-CM | POA: Diagnosis present

## 2015-01-08 DIAGNOSIS — D6959 Other secondary thrombocytopenia: Secondary | ICD-10-CM | POA: Diagnosis present

## 2015-01-08 DIAGNOSIS — K630 Abscess of intestine: Secondary | ICD-10-CM | POA: Diagnosis not present

## 2015-01-08 HISTORY — DX: Diverticulitis of large intestine with perforation and abscess without bleeding: K57.20

## 2015-01-08 LAB — URINALYSIS, ROUTINE W REFLEX MICROSCOPIC
Bilirubin Urine: NEGATIVE
Glucose, UA: NEGATIVE mg/dL
Hgb urine dipstick: NEGATIVE
Ketones, ur: NEGATIVE mg/dL
Leukocytes, UA: NEGATIVE
Nitrite: NEGATIVE
Protein, ur: NEGATIVE mg/dL
Specific Gravity, Urine: >1.046 — ABNORMAL HIGH (ref 1.005–1.030)
pH: 5.5 (ref 5.0–8.0)

## 2015-01-08 LAB — CBC WITH DIFFERENTIAL/PLATELET
Basophils Absolute: 0 10*3/uL (ref 0.0–0.1)
Basophils Relative: 0 %
Eosinophils Absolute: 0 10*3/uL (ref 0.0–0.7)
Eosinophils Relative: 1 %
HCT: 41.5 % (ref 39.0–52.0)
Hemoglobin: 14 g/dL (ref 13.0–17.0)
Lymphocytes Relative: 12 %
Lymphs Abs: 0.8 10*3/uL (ref 0.7–4.0)
MCH: 31.3 pg (ref 26.0–34.0)
MCHC: 33.7 g/dL (ref 30.0–36.0)
MCV: 92.6 fL (ref 78.0–100.0)
Monocytes Absolute: 1 10*3/uL (ref 0.1–1.0)
Monocytes Relative: 14 %
Neutro Abs: 5.4 10*3/uL (ref 1.7–7.7)
Neutrophils Relative %: 74 %
Platelets: 91 10*3/uL — ABNORMAL LOW (ref 150–400)
RBC: 4.48 MIL/uL (ref 4.22–5.81)
RDW: 16.5 % — ABNORMAL HIGH (ref 11.5–15.5)
WBC: 7.3 10*3/uL (ref 4.0–10.5)

## 2015-01-08 LAB — BASIC METABOLIC PANEL
Anion gap: 9 (ref 5–15)
BUN: 17 mg/dL (ref 6–20)
CO2: 25 mmol/L (ref 22–32)
Calcium: 8.2 mg/dL — ABNORMAL LOW (ref 8.9–10.3)
Chloride: 102 mmol/L (ref 101–111)
Creatinine, Ser: 0.81 mg/dL (ref 0.61–1.24)
GFR calc Af Amer: >60 mL/min (ref >60)
GFR calc non Af Amer: >60 mL/min (ref >60)
Glucose, Bld: 95 mg/dL (ref 65–99)
Potassium: 4.1 mmol/L (ref 3.5–5.1)
Sodium: 136 mmol/L (ref 135–145)

## 2015-01-08 MED ORDER — DEXTROSE 5 % IV SOLN
1.0000 g | Freq: Once | INTRAVENOUS | Status: DC
  Filled 2015-01-08: qty 10

## 2015-01-08 MED ORDER — PIPERACILLIN-TAZOBACTAM 3.375 G IVPB
3.3750 g | Freq: Three times a day (TID) | INTRAVENOUS | Status: DC
  Administered 2015-01-08 – 2015-01-15 (×20): 3.375 g via INTRAVENOUS
  Filled 2015-01-08 (×17): qty 50

## 2015-01-08 MED ORDER — ALPRAZOLAM 0.5 MG PO TABS
1.0000 mg | ORAL_TABLET | Freq: Every evening | ORAL | Status: DC | PRN
  Administered 2015-01-09 – 2015-01-10 (×2): 1 mg via ORAL
  Administered 2015-01-11 – 2015-01-14 (×4): 2 mg via ORAL
  Filled 2015-01-08: qty 2
  Filled 2015-01-08 (×4): qty 4
  Filled 2015-01-08: qty 2

## 2015-01-08 MED ORDER — PANTOPRAZOLE SODIUM 40 MG IV SOLR
40.0000 mg | INTRAVENOUS | Status: DC
  Administered 2015-01-08 – 2015-01-13 (×6): 40 mg via INTRAVENOUS
  Filled 2015-01-08 (×6): qty 40

## 2015-01-08 MED ORDER — SODIUM CHLORIDE 0.9 % IV BOLUS (SEPSIS)
1000.0000 mL | Freq: Once | INTRAVENOUS | Status: AC
  Administered 2015-01-08: 1000 mL via INTRAVENOUS

## 2015-01-08 MED ORDER — METRONIDAZOLE IVPB CUSTOM
1.0000 g | Freq: Once | INTRAVENOUS | Status: DC
  Filled 2015-01-08: qty 200

## 2015-01-08 MED ORDER — ACYCLOVIR 400 MG PO TABS
400.0000 mg | ORAL_TABLET | Freq: Two times a day (BID) | ORAL | Status: DC
  Administered 2015-01-08 – 2015-01-15 (×14): 400 mg via ORAL
  Filled 2015-01-08 (×14): qty 1

## 2015-01-08 MED ORDER — IOHEXOL 300 MG/ML  SOLN
100.0000 mL | Freq: Once | INTRAMUSCULAR | Status: AC | PRN
  Administered 2015-01-08: 100 mL via INTRAVENOUS

## 2015-01-08 MED ORDER — DEXTROSE-NACL 5-0.9 % IV SOLN
INTRAVENOUS | Status: DC
  Administered 2015-01-08 – 2015-01-15 (×9): via INTRAVENOUS

## 2015-01-08 NOTE — H&P (Signed)
History and Physical  Dean [name] ZHG:[id number] DOB: 07-Oct-1935 DOA: 01/08/2015  PCP: [name], MD   Chief Complaint: Peri-diverticular abscess   History of Present Illness:  - Patient with history of MM diagnosed in Jan 2016 on Revlimid every other day who was found to have peridiverticular abscess/diverticulitis incidentally in a PET scan done 2 weeks ago with failing of outpatient management for the past two weeks. He has been taken Flagyl and Cipro for the past two weeks. Repeat CT scan today shows increase in abscess size and persistence over diverticulitis.  - patient has no complaints except for fatigue/weakness that started back in January and has been progressing. He had  fever/chills for the past two days but denied any abdominal pain, N/V, diarrhea, blood in stool. No other complaints.   Review of Systems:  CONSTITUTIONAL:  No night sweats.  +fatigue,  +fever +chills. Eyes:  No visual changes.  No eye pain.  No eye discharge.   ENT:    No epistaxis.  No sinus pain.  No sore throat.  No ear pain.  No congestion. RESPIRATORY:  No cough.  No wheeze.  No hemoptysis.  No shortness of breath. CARDIOVASCULAR:  No chest pains.  No palpitations. GASTROINTESTINAL:  No abdominal pain.  No nausea or vomiting.  No diarrhea or constipation.  No hematemesis.  No hematochezia.  No melena. GENITOURINARY:  No urgency.  No frequency.  No dysuria.  No hematuria.  No obstructive symptoms.  No discharge.  No pain.  No significant abnormal bleeding. MUSCULOSKELETAL:  No musculoskeletal pain.  No joint swelling.  No arthritis. NEUROLOGICAL:  No confusion.  No weakness. No headache. No seizure. PSYCHIATRIC:  No depression. No anxiety. No suicidal ideation. SKIN:  No rashes.  No lesions.  No wounds. ENDOCRINE:  No unexplained weight loss.  No polydipsia.  No polyuria.  No polyphagia. HEMATOLOGIC:  No anemia.  No purpura.  No petechiae.  No bleeding.  ALLERGIC AND IMMUNOLOGIC:  No  pruritus.  No swelling Other:  Past Medical and Surgical History:   Past Medical History  Diagnosis Date  . Neuropathy ([place])   . BPH (benign prostatic hyperplasia)   . GERD (gastroesophageal reflux disease)   . H/O ETOH abuse   . Skull lesion     Left occipital condyle  . History of radiation therapy 01/05/13-02/10/13    45 gray to left occipital condyle region  . Multiple myeloma ([place])   . Radiation 02/21/14-03/08/14    right posterior chest wall area 30 gray  . Radiation 04/19/14-05/02/14    lumbar spine 25 gray  . Diverticulitis 12/22/14   Past Surgical History  Procedure Laterality Date  . Cyst excision  1959    tail bone    Social History:   reports that he has never smoked. He has never used smokeless tobacco. He reports that he does not drink alcohol or use illicit drugs.   Allergies  Allergen Reactions  . Codeine Other (See Comments)    Severe headache  . Morphine And Related Other (See Comments)    Makes him feel weird    Family History  Problem Relation Age of Onset  . Diabetes Mother   . Stroke Mother   . Hypertension Father       Prior to Admission medications   Medication Sig Start Date End Date Taking? Authorizing Provider  acyclovir (ZOVIRAX) 400 MG tablet Take 1 tablet (400 mg total) by mouth 2 (two) times daily. 06/29/14  Yes Manon Hilding  Kefalas, PA-C  ALPRAZolam (XANAX) 1 MG tablet Take 1-2 tablets (1-2 mg total) by mouth at bedtime as needed for sleep. For anxiety and/or sleep 12/12/14  Yes Baird Cancer, PA-C  aspirin EC 325 MG tablet Take 1 tablet (325 mg total) by mouth daily. 03/09/14  Yes [name], MD  Calcium Carb-Cholecalciferol (CALCIUM-VITAMIN D3) 600-500 MG-UNIT CAPS Take 1 tablet by mouth 2 (two) times daily. With food Patient taking differently: Take 1 tablet by mouth 3 (three) times daily. With food 03/07/14  Yes [name], MD  clotrimazole (LOTRIMIN) 1 % cream Apply 1 application topically 2 (two) times daily. To ear Patient  taking differently: Apply 1 application topically 2 (two) times daily as needed (ear). To ear 01/16/14  Yes Alycia Rossetti, MD  dexamethasone (DECADRON) 4 MG tablet Take 10 tablets (28m total) on Days 1, 8, & 15 of chemo. Patient taking differently: Take 20 mg by mouth 2 (two) times a week. Every Tuesday and friday 12/12/14  Yes TManon HildingKefalas, PA-C  diphenhydrAMINE (BENADRYL) 25 MG tablet Take 25 mg by mouth at bedtime as needed for sleep.   Yes Historical Provider, MD  gabapentin (NEURONTIN) 400 MG capsule Take 1 capsule (400 mg total) by mouth 4 (four) times daily. Patient taking differently: Take 400 mg by mouth 2 (two) times daily as needed (pain).  10/02/14  Yes TManon HildingKefalas, PA-C  lenalidomide (REVLIMID) 10 MG capsule Take 1 capsule (10 mg total) by mouth every other day. 12/20/14  Yes TManon HildingKefalas, PA-C  omeprazole (PRILOSEC) 20 MG capsule Take 1 capsule (20 mg total) by mouth daily. Patient taking differently: Take 20 mg by mouth daily as needed (heartburn/acid reflux).  05/10/14  Yes TManon HildingKefalas, PA-C  ondansetron (ZOFRAN) 8 MG tablet Take 1 tablet (8 mg total) by mouth every 8 (eight) hours as needed for nausea or vomiting. 03/10/14  Yes SPatrici Ranks MD  oxyCODONE-acetaminophen (PERCOCET) 10-325 MG tablet Take 1 tablet by mouth every 4 (four) hours as needed for pain. 12/25/14  Yes SPatrici Ranks MD  PRESCRIPTION MEDICATION [facility] [place]  tamsulosin (FLOMAX) 0.4 MG CAPS capsule Take 1 capsule (0.4 mg total) by mouth at bedtime. 10/02/14  Yes TBaird Cancer PA-C  Zoledronic Acid (ZOMETA IV) Inject into the vein. Every 28 days   Yes Historical Provider, MD  ciprofloxacin (CIPRO) 500 MG tablet Take 1 tablet (500 mg total) by mouth 2 (two) times daily. Patient not taking: Reported on 01/08/2015 12/22/14   SPatrici Ranks MD  cyclobenzaprine (FLEXERIL) 10 MG tablet Take 1 tablet (10 mg total) by mouth 3  (three) times daily as needed for muscle spasms. Patient not taking: Reported on 11/27/2014 11/10/14   WSusy Frizzle MD  doxepin ([facility] 10 MG capsule Take one at night for sleep Patient not taking: Reported on 12/25/2014 11/27/14   SPatrici Ranks MD  Doxepin HCl (SILENOR) 6 MG TABS Take 1 tablet (6 mg total) by mouth at bedtime. Patient not taking: Reported on 12/25/2014 05/19/14   SPatrici Ranks MD  metroNIDAZOLE (FLAGYL) 500 MG tablet Take 1 tablet (500 mg total) by mouth 3 (three) times daily. Patient not taking: Reported on 01/08/2015 12/22/14   SPatrici Ranks MD    Physical Exam: BP 114/54 mmHg  Pulse 36  Temp(Src) 97.3 F (36.3 C) (Oral)  Resp 20  Ht 5' 8"  (1.727 m)  Wt  100.699 kg (222 lb)  BMI 33.76 kg/m2  SpO2 97%  GENERAL : Well developed, well nourished, alert and cooperative, and appears to be in no acute distress. HEAD: normocephalic. EYES: PERRL, EOMI. vision is grossly intact. EARS:  hearing grossly intact. NOSE: No nasal discharge. THROAT: Oral cavity and pharynx normal.  NECK: Neck supple, CARDIAC: Normal S1 and S2. No S3, S4 or murmurs. Rhythm is regular. There is no peripheral edema, cyanosis or pallor. LUNGS: Clear to auscultation  ABDOMEN: Positive bowel sounds. Soft, nondistended, nontender. No guarding or rebound. No masses. EXTREMITIES: No significant deformity or joint abnormality. No edema.  NEUROLOGICAL: The mental examination revealed the patient was oriented to person, place, and time.CN II-XII intact. Strength and sensation symmetric and intact throughout.  SKIN: Skin normal color PSYCHIATRIC:  The patient was able to demonstrate good judgement and reason, without hallucinations, abnormal affect or abnormal behaviors during the examination. Patient is not suicidal.          Labs on Admission:  Reviewed.   Radiological Exams on Admission: Ct Abdomen Pelvis W Contrast  01/08/2015  CLINICAL DATA:  Follow up diverticulitis. History  of multiple myeloma. EXAM: CT ABDOMEN AND PELVIS WITH CONTRAST TECHNIQUE: Multidetector CT imaging of the abdomen and pelvis was performed using the standard protocol following bolus administration of intravenous contrast. CONTRAST:  136m OMNIPAQUE IOHEXOL 300 MG/ML  SOLN COMPARISON:  PET-CT 08/11/2014 and 12/22/2014. FINDINGS: Lower chest: There are no significant residual findings at the visualized lung bases. There is mild linear right lower lobe scarring. Aortic and coronary artery atherosclerosis noted. Hepatobiliary: The liver is normal in density without focal abnormality. No significant biliary dilatation status post cholecystectomy. Pancreas: Unremarkable. No pancreatic ductal dilatation or surrounding inflammatory changes. Spleen: Normal in size without focal abnormality. Adrenals/Urinary Tract: Both adrenal glands appear normal. Grossly stable left renal cortical cysts and parapelvic cysts bilaterally. No hydronephrosis or urinary tract calculus. The bladder appears unremarkable. Stomach/Bowel: The stomach, small bowel, proximal colon and appendix demonstrate no significant findings. Diverticular changes are again noted throughout the distal colon. There are persistent inflammatory changes surrounding the sigmoid colon with small extraluminal air bubbles and surrounding inflammatory change. There is a fluid collection beneath the anterior abdominal wall which contains air bubbles and measures up to 6.3 x 2.4 cm on image 74. This has mildly enlarged compared with the prior study. There is no free intraperitoneal air or evidence of bowel obstruction. Vascular/Lymphatic: There are no enlarged abdominal or pelvic lymph nodes. Fairly extensive atherosclerosis of the aorta, its branches and the iliac arteries. No evidence of large vessel occlusion. Reproductive: Stable moderate enlargement of the prostate gland without apparent focal abnormality. Other: Stable small umbilical hernia containing only fat.  Musculoskeletal: The lytic lesions in the left aspect of the L3 vertebral body and the right aspect of the sacrum are unchanged. The latter measures 2.2 cm on image 67. L4 superior endplate compression deformity appears unchanged. No new lytic lesions or pathologic fracture. IMPRESSION: 1. Persistent changes of sigmoid diverticulitis without significant improvement over the last 17 days. There is a persistent peridiverticular abscess. No evidence of obstruction. 2. No acute osseous findings or evidence of progressive multiple myeloma. Lytic L3 and right sacral lesions are unchanged. 3. Stable incidental findings including diffuse atherosclerosis, renal parapelvic cysts and prostatomegaly. 4. These results were called by telephone at the time of interpretation on 01/08/2015 at 2:45 pm to Dr. SAncil Linsey, who verbally acknowledged these results. Electronically Signed   By: WRichardean Sale  M.D.   On: 01/08/2015 14:50      Assessment/Plan  Peridiverticular abscess/diverticulitis:  Consult to surgery / IR for abscess drainage IV abx: Zosyn IVF: NS/D5 @ 100cc/hr VS stable Keep NPO  MM: hold Revlimid for now. Consult with Oncology prior to discharge.   Thrombocytopenia: likely due to MM, stable.   Holding aspirin for possible drainage / surgery.    DVT prophylaxis: SCDs GI prophylaxis: PPI IV Consultants: IR/surgery Code Status: Full    Dean [name] M.D [facility]

## 2015-01-08 NOTE — ED Notes (Signed)
Patient had a case of diverticulitis. Had CT and PET scans and an abscess was noted in the abdomen. Was told to come to the ED to be admitted. Denies pain, denies blood in stool, denies n/v/d.

## 2015-01-08 NOTE — ED Notes (Signed)
PA at bedside.

## 2015-01-08 NOTE — ED Notes (Signed)
Report attempted to 3W. RN was not available

## 2015-01-08 NOTE — ED Notes (Signed)
Hospitalist at bedside 

## 2015-01-08 NOTE — Progress Notes (Signed)
ANTIBIOTIC CONSULT NOTE - INITIAL  Pharmacy Consult for Zosyn Indication: Peridiverticular abscess/diverticulitis  Allergies  Allergen Reactions  . Codeine Other (See Comments)    Severe headache  . Morphine And Related Other (See Comments)    Makes him feel weird    Patient Measurements: Height: 5' 8"  (172.7 cm) Weight: 222 lb (100.699 kg) IBW/kg (Calculated) : 68.4  Vital Signs: Temp: 97.3 F (36.3 C) (12/05 1647) Temp Source: Oral (12/05 1647) BP: 109/67 mmHg (12/05 1933) Pulse Rate: 42 (12/05 1933) Intake/Output from previous day:   Intake/Output from this shift:    Labs:  Recent Labs  01/08/15 1707  WBC 7.3  HGB 14.0  PLT 91*  CREATININE 0.81   Estimated Creatinine Clearance: 87.8 mL/min (by C-G formula based on Cr of 0.81). No results for input(s): VANCOTROUGH, VANCOPEAK, VANCORANDOM, GENTTROUGH, GENTPEAK, GENTRANDOM, TOBRATROUGH, TOBRAPEAK, TOBRARND, AMIKACINPEAK, AMIKACINTROU, AMIKACIN in the last 72 hours.   Microbiology: No results found for this or any previous visit (from the past 720 hour(s)).  Medical History: Past Medical History  Diagnosis Date  . Neuropathy ([place])   . BPH (benign prostatic hyperplasia)   . GERD (gastroesophageal reflux disease)   . H/O ETOH abuse   . Skull lesion     Left occipital condyle  . History of radiation therapy 01/05/13-02/10/13    45 gray to left occipital condyle region  . Multiple myeloma ([place])   . Radiation 02/21/14-03/08/14    right posterior chest wall area 30 gray  . Radiation 04/19/14-05/02/14    lumbar spine 25 gray  . Diverticulitis 12/22/14    Assessment: 55 y/oM with PMH of multiple myeloma who was found to have peridiverticular abscess/diverticulitis incidentally on PET scan 2 weeks ago and was placed on Cipro and Flagyl as an outpatient. For the past 2 days, patient began having fevers and chills. Repeat CT scan today shows increase in abscess size and persistent diverticulitis without significant  improvement. Patient admitted and started on IV zosyn.   12/5 >> Zosyn >>  Tmax: 97.31F WBC WNL SCr 0.81 with CrCl ~ 88 ml/min CG  Goal of Therapy:  Appropriate antibiotic dosing for renal function and indication Eradication of infection  Plan:   Continue Zosyn 3.375g IV q8h (infuse over 4 hours) as ordered per surgery.  Monitor renal function, culture results as available, clinical course.   Lindell Spar, PharmD, BCPS Pager: [phone] 01/08/2015 7:48 PM

## 2015-01-08 NOTE — ED Provider Notes (Signed)
CSN: [id number]     Arrival date & time 01/08/15  1626 History   First MD Initiated Contact with Patient 01/08/15 1631     Chief Complaint  Patient presents with  . Abdominal Abscess    (Consider location/radiation/quality/duration/timing/severity/associated sxs/prior Treatment) The history is provided by the patient and the spouse. No language interpreter was used.   Mr. Worlds is a 79 year old male with a history of multiple myeloma, diverticulitis, GERD, and alcohol abuse who states that he was seen by his oncologist's nurse earlier today for his last round of medications for multiple myeloma when he was sent to the emergency department due to a finding on his abdominal CT scan done earlier today with showed diverticular abscess. This was originally ordered because he had a PET scan done 2 weeks ago which showed diverticulitis.  He takes revlimid every other day. He takes 5 dexamethasone on Tuesday and 5 on Friday. He takes 3 calcium tablets per day, he has also been taking Tums 4-5 times per day. He takes 1 aspirin per day. He has been taking his pain medication, with about 7 left.  He says he has been taking 2 antibiotics but does not know the names. At the time of his PET scan and his CT scan he denied any nausea, vomiting, diarrhea, hematemesis, hematochezia, or abdominal pain. This seems to be an incidental finding although the patient is complaining of weakness. This may be due to the medications that he is currently on. He states his last bowel movement was earlier today and was normal without diarrhea or blood. He denies any fever, chills, hematuria, dysuria.  Past Medical History  Diagnosis Date  . Neuropathy ([place])   . BPH (benign prostatic hyperplasia)   . GERD (gastroesophageal reflux disease)   . H/O ETOH abuse   . Skull lesion     Left occipital condyle  . History of radiation therapy 01/05/13-02/10/13    45 gray to left occipital condyle region  . Multiple myeloma ([place])   .  Radiation 02/21/14-03/08/14    right posterior chest wall area 30 gray  . Radiation 04/19/14-05/02/14    lumbar spine 25 gray  . Diverticulitis 12/22/14   Past Surgical History  Procedure Laterality Date  . Cyst excision  1959    tail bone   Family History  Problem Relation Age of Onset  . Diabetes Mother   . Stroke Mother   . Hypertension Father    Social History  Substance Use Topics  . Smoking status: Never Smoker   . Smokeless tobacco: Never Used  . Alcohol Use: No     Comment: " Not much no more"    Review of Systems  Constitutional: Negative for fever.  All other systems reviewed and are negative.     Allergies  Codeine and Morphine and related  Home Medications   Prior to Admission medications   Medication Sig Start Date End Date Taking? Authorizing Provider  acyclovir (ZOVIRAX) 400 MG tablet Take 1 tablet (400 mg total) by mouth 2 (two) times daily. 06/29/14  Yes Baird Cancer, PA-C  ALPRAZolam Duanne Moron) 1 MG tablet Take 1-2 tablets (1-2 mg total) by mouth at bedtime as needed for sleep. For anxiety and/or sleep 12/12/14  Yes Baird Cancer, PA-C  aspirin EC 325 MG tablet Take 1 tablet (325 mg total) by mouth daily. 03/09/14  Yes [name], MD  Calcium Carb-Cholecalciferol (CALCIUM-VITAMIN D3) 600-500 MG-UNIT CAPS Take 1 tablet by mouth 2 (two)  times daily. With food Patient taking differently: Take 1 tablet by mouth 3 (three) times daily. With food 03/07/14  Yes [name], MD  clotrimazole (LOTRIMIN) 1 % cream Apply 1 application topically 2 (two) times daily. To ear Patient taking differently: Apply 1 application topically 2 (two) times daily as needed (ear). To ear 01/16/14  Yes Alycia Rossetti, MD  dexamethasone (DECADRON) 4 MG tablet Take 10 tablets (34m total) on Days 1, 8, & 15 of chemo. Patient taking differently: Take 20 mg by mouth 2 (two) times a week. Every Tuesday and friday 12/12/14  Yes TManon HildingKefalas, PA-C  diphenhydrAMINE (BENADRYL)  25 MG tablet Take 25 mg by mouth at bedtime as needed for sleep.   Yes Historical Provider, MD  gabapentin (NEURONTIN) 400 MG capsule Take 1 capsule (400 mg total) by mouth 4 (four) times daily. Patient taking differently: Take 400 mg by mouth 2 (two) times daily as needed (pain).  10/02/14  Yes TManon HildingKefalas, PA-C  lenalidomide (REVLIMID) 10 MG capsule Take 1 capsule (10 mg total) by mouth every other day. 12/20/14  Yes TManon HildingKefalas, PA-C  omeprazole (PRILOSEC) 20 MG capsule Take 1 capsule (20 mg total) by mouth daily. Patient taking differently: Take 20 mg by mouth daily as needed (heartburn/acid reflux).  05/10/14  Yes TManon HildingKefalas, PA-C  ondansetron (ZOFRAN) 8 MG tablet Take 1 tablet (8 mg total) by mouth every 8 (eight) hours as needed for nausea or vomiting. 03/10/14  Yes SPatrici Ranks MD  oxyCODONE-acetaminophen (PERCOCET) 10-325 MG tablet Take 1 tablet by mouth every 4 (four) hours as needed for pain. 12/25/14  Yes SPatrici Ranks MD  PRESCRIPTION MEDICATION [facility]  tamsulosin (FLOMAX) 0.4 MG CAPS capsule Take 1 capsule (0.4 mg total) by mouth at bedtime. 10/02/14  Yes TBaird Cancer PA-C  Zoledronic Acid (ZOMETA IV) Inject into the vein. Every 28 days   Yes Historical Provider, MD  ciprofloxacin (CIPRO) 500 MG tablet Take 1 tablet (500 mg total) by mouth 2 (two) times daily. Patient not taking: Reported on 01/08/2015 12/22/14   SPatrici Ranks MD  cyclobenzaprine (FLEXERIL) 10 MG tablet Take 1 tablet (10 mg total) by mouth 3 (three) times daily as needed for muscle spasms. Patient not taking: Reported on 11/27/2014 11/10/14   WSusy Frizzle MD  doxepin ([facility] 10 MG capsule Take one at night for sleep Patient not taking: Reported on 12/25/2014 11/27/14   SPatrici Ranks MD  Doxepin HCl (SILENOR) 6 MG TABS Take 1 tablet (6 mg total) by mouth at bedtime. Patient not taking: Reported on 12/25/2014  05/19/14   SPatrici Ranks MD  metroNIDAZOLE (FLAGYL) 500 MG tablet Take 1 tablet (500 mg total) by mouth 3 (three) times daily. Patient not taking: Reported on 01/08/2015 12/22/14   SPatrici Ranks MD   BP 148/63 mmHg  Pulse 83  Temp(Src) 98.3 F (36.8 C) (Axillary)  Resp 18  Ht _0  (1.727 m)  Wt 100.4 kg  BMI 33.66 kg/m2  SpO2 95% Physical Exam  Constitutional: He is oriented to person, place, and time. He appears well-developed and well-nourished. No distress.  HENT:  Head: Normocephalic and atraumatic.  Eyes: Conjunctivae are normal.  Neck: Normal range of motion. Neck supple.  Cardiovascular: Normal rate, regular rhythm and normal heart sounds.   Regular rate and rhythm. No murmur.  Pulmonary/Chest: Effort normal and breath sounds  normal.  Lungs clear to auscultation bilaterally.  Abdominal: Soft. Normal appearance. He exhibits no distension. There is tenderness. There is no rebound and no guarding.    Obese. Left lower quadrant abdominal tenderness to palpation. No guarding or rebound. No abdominal distention.  Musculoskeletal: Normal range of motion.  Neurological: He is alert and oriented to person, place, and time.  Skin: Skin is warm and dry.  Nursing note and vitals reviewed.   ED Course  Procedures (including critical care time) Labs Review Labs Reviewed  CBC WITH DIFFERENTIAL/PLATELET - Abnormal; Notable for the following:    RDW 16.5 (*)    Platelets 91 (*)    All other components within normal limits  BASIC METABOLIC PANEL - Abnormal; Notable for the following:    Calcium 8.2 (*)    All other components within normal limits  URINALYSIS, ROUTINE W REFLEX MICROSCOPIC (NOT AT [facility]) - Abnormal; Notable for the following:    Color, Urine AMBER (*)    Specific Gravity, Urine >1.046 (*)    All other components within normal limits  COMPREHENSIVE METABOLIC PANEL  CBC WITH DIFFERENTIAL/PLATELET    Imaging Review Ct Abdomen Pelvis W  Contrast  01/08/2015  CLINICAL DATA:  Follow up diverticulitis. History of multiple myeloma. EXAM: CT ABDOMEN AND PELVIS WITH CONTRAST TECHNIQUE: Multidetector CT imaging of the abdomen and pelvis was performed using the standard protocol following bolus administration of intravenous contrast. CONTRAST:  163m OMNIPAQUE IOHEXOL 300 MG/ML  SOLN COMPARISON:  PET-CT 08/11/2014 and 12/22/2014. FINDINGS: Lower chest: There are no significant residual findings at the visualized lung bases. There is mild linear right lower lobe scarring. Aortic and coronary artery atherosclerosis noted. Hepatobiliary: The liver is normal in density without focal abnormality. No significant biliary dilatation status post cholecystectomy. Pancreas: Unremarkable. No pancreatic ductal dilatation or surrounding inflammatory changes. Spleen: Normal in size without focal abnormality. Adrenals/Urinary Tract: Both adrenal glands appear normal. Grossly stable left renal cortical cysts and parapelvic cysts bilaterally. No hydronephrosis or urinary tract calculus. The bladder appears unremarkable. Stomach/Bowel: The stomach, small bowel, proximal colon and appendix demonstrate no significant findings. Diverticular changes are again noted throughout the distal colon. There are persistent inflammatory changes surrounding the sigmoid colon with small extraluminal air bubbles and surrounding inflammatory change. There is a fluid collection beneath the anterior abdominal wall which contains air bubbles and measures up to 6.3 x 2.4 cm on image 74. This has mildly enlarged compared with the prior study. There is no free intraperitoneal air or evidence of bowel obstruction. Vascular/Lymphatic: There are no enlarged abdominal or pelvic lymph nodes. Fairly extensive atherosclerosis of the aorta, its branches and the iliac arteries. No evidence of large vessel occlusion. Reproductive: Stable moderate enlargement of the prostate gland without apparent focal  abnormality. Other: Stable small umbilical hernia containing only fat. Musculoskeletal: The lytic lesions in the left aspect of the L3 vertebral body and the right aspect of the sacrum are unchanged. The latter measures 2.2 cm on image 67. L4 superior endplate compression deformity appears unchanged. No new lytic lesions or pathologic fracture. IMPRESSION: 1. Persistent changes of sigmoid diverticulitis without significant improvement over the last 17 days. There is a persistent peridiverticular abscess. No evidence of obstruction. 2. No acute osseous findings or evidence of progressive multiple myeloma. Lytic L3 and right sacral lesions are unchanged. 3. Stable incidental findings including diffuse atherosclerosis, renal parapelvic cysts and prostatomegaly. 4. These results were called by telephone at the time of interpretation on 01/08/2015 at 2:45 pm to  Dr. Ancil Linsey , who verbally acknowledged these results. Electronically Signed   By: Richardean Sale M.D.   On: 01/08/2015 14:50   I have personally reviewed and evaluated these images and lab results as part of my medical decision-making.   EKG Interpretation None      MDM   Final diagnoses:  Colonic diverticular abscess   Patient presents for abnormal CT results from his oncologist after being seen by oncology nurse today. He denies any fever, chills, night sweats, nausea, vomiting, diarrhea, hematemesis, or hematochezia. His last bowel movement was earlier today. He admits that his appetite has not been great but is most likely due to his chemotherapy medications. He also complains of weakness. He had his last dose of medications including Revlimid 10 mg earlier today. His CT scan from earlier today shows diverticular abscess along the sigmoid colon but no bowel obstruction. He has been taking two anabiotic medications for this which has not improved his diverticulitis in the past 17 days. I spoke to oncology, Dr.Gudena regarding the CT  findings and plan for IV antibiotics and admission. He agreed with the plan. Dr. Zella Richer with general surgery spoke to Dr. Whitney Muse regarding need for admission and IR consult. Patient is otherwise afebrile with mild LLQ abdominal pain but vitals are stable and he is well appearing.  Patient was admitted by hospitalist to [facility] and orders were placed by general surgery for IR eval and management.      Ottie Glazier, PA-C 01/08/15 2309  Leo Grosser, MD 01/09/15 [phone]

## 2015-01-08 NOTE — ED Notes (Signed)
Surgeon at bedside.  

## 2015-01-08 NOTE — ED Notes (Addendum)
Dr. Zella Richer at bedside. States that he is switching from rocephin and flagyl to zosyn.

## 2015-01-08 NOTE — Consult Note (Addendum)
Reason for Consult: Diverticular abscess Referring Physician: Dr. Gaynell Face is an 79 y.o. male.  HPI: this is a 79 year old male who is currently being treated for multiple myeloma. He had a PET scan November 18 which demonstrated findings consistent with acute diverticulitis and a small diverticular abscess. He was started on ciprofloxacin and Flagyl. For the past 2 days he began having fevers and chills. Dr. Whitney Muse ordered a follow up CT scan  which demonstrated an enlarging diverticular abscess with an air-fluid level. It measured about 6.5 cm. He still has active diverticulitis of the sigmoid colon and some of the descending colon despite medical treatment. He was sent to the emergency department here at rest to evaluate him. He states he is not having any pain or diarrhea. Does have some decrease in appetite.  Past Medical History  Diagnosis Date  . Neuropathy ([place])   . BPH (benign prostatic hyperplasia)   . GERD (gastroesophageal reflux disease)   . H/O ETOH abuse   . Skull lesion     Left occipital condyle  . History of radiation therapy 01/05/13-02/10/13    45 gray to left occipital condyle region  . Multiple myeloma ([place])   . Radiation 02/21/14-03/08/14    right posterior chest wall area 30 gray  . Radiation 04/19/14-05/02/14    lumbar spine 25 gray  . Diverticulitis 12/22/14    Past Surgical History  Procedure Laterality Date  . Cyst excision  1959    tail bone    Family History  Problem Relation Age of Onset  . Diabetes Mother   . Stroke Mother   . Hypertension Father     Social History:  reports that he has never smoked. He has never used smokeless tobacco. He reports that he does not drink alcohol or use illicit drugs.  Allergies:  Allergies  Allergen Reactions  . Codeine Other (See Comments)    Severe headache  . Morphine And Related Other (See Comments)    Makes him feel weird    Prior to Admission medications   Medication Sig Start Date End  Date Taking? Authorizing Provider  acyclovir (ZOVIRAX) 400 MG tablet Take 1 tablet (400 mg total) by mouth 2 (two) times daily. 06/29/14  Yes Baird Cancer, PA-C  ALPRAZolam Duanne Moron) 1 MG tablet Take 1-2 tablets (1-2 mg total) by mouth at bedtime as needed for sleep. For anxiety and/or sleep 12/12/14  Yes Baird Cancer, PA-C  aspirin EC 325 MG tablet Take 1 tablet (325 mg total) by mouth daily. 03/09/14  Yes [name], MD  Calcium Carb-Cholecalciferol (CALCIUM-VITAMIN D3) 600-500 MG-UNIT CAPS Take 1 tablet by mouth 2 (two) times daily. With food Patient taking differently: Take 1 tablet by mouth 3 (three) times daily. With food 03/07/14  Yes [name], MD  clotrimazole (LOTRIMIN) 1 % cream Apply 1 application topically 2 (two) times daily. To ear Patient taking differently: Apply 1 application topically 2 (two) times daily as needed (ear). To ear 01/16/14  Yes Alycia Rossetti, MD  dexamethasone (DECADRON) 4 MG tablet Take 10 tablets (76m total) on Days 1, 8, & 15 of chemo. Patient taking differently: Take 20 mg by mouth 2 (two) times a week. Every Tuesday and friday 12/12/14  Yes TManon HildingKefalas, PA-C  ciprofloxacin (CIPRO) 500 MG tablet Take 1 tablet (500 mg total) by mouth 2 (two) times daily. Patient not taking: Reported on 01/08/2015 12/22/14   SPatrici Ranks MD  cyclobenzaprine (FLEXERIL)  10 MG tablet Take 1 tablet (10 mg total) by mouth 3 (three) times daily as needed for muscle spasms. Patient not taking: Reported on 11/27/2014 11/10/14   [name], MD  diphenhydrAMINE (BENADRYL) 25 MG tablet Take 25 mg by mouth at bedtime as needed for sleep.    Historical Provider, MD  doxepin (SINEQUAN) 10 MG capsule Take one at night for sleep Patient not taking: Reported on 12/25/2014 11/27/14   [name], MD  Doxepin HCl (SILENOR) 6 MG TABS Take 1 tablet (6 mg total) by mouth at bedtime. Patient not taking: Reported on 12/25/2014 05/19/14   [name], MD   gabapentin (NEURONTIN) 400 MG capsule Take 1 capsule (400 mg total) by mouth 4 (four) times daily. Patient taking differently: Take 400 mg by mouth 2 (two) times daily.  10/02/14   Baird Cancer, PA-C  lenalidomide (REVLIMID) 10 MG capsule Take 1 capsule (10 mg total) by mouth every other day. 12/20/14   Baird Cancer, PA-C  metroNIDAZOLE (FLAGYL) 500 MG tablet Take 1 tablet (500 mg total) by mouth 3 (three) times daily. 12/22/14   [name], MD  mirtazapine (REMERON) 30 MG tablet Take 30 mg by mouth daily. 12/12/14   Historical Provider, MD  omeprazole (PRILOSEC) 20 MG capsule Take 1 capsule (20 mg total) by mouth daily. 05/10/14   Baird Cancer, PA-C  ondansetron (ZOFRAN) 8 MG tablet Take 1 tablet (8 mg total) by mouth every 8 (eight) hours as needed for nausea or vomiting. Patient not taking: Reported on 11/27/2014 03/10/14   [name], MD  oxyCODONE-acetaminophen (PERCOCET) 10-325 MG tablet Take 1 tablet by mouth every 4 (four) hours as needed for pain. 12/25/14   [name], MD  tamsulosin (FLOMAX) 0.4 MG CAPS capsule Take 1 capsule (0.4 mg total) by mouth at bedtime. 10/02/14   Baird Cancer, PA-C  Zoledronic Acid (ZOMETA IV) Inject into the vein. Every 28 days    Historical Provider, MD     Results for orders placed or performed during the hospital encounter of 01/08/15 (from the past 48 hour(s))  CBC with Differential     Status: Abnormal (Preliminary result)   Collection Time: 01/08/15  5:07 PM  Result Value Ref Range   WBC 7.3 4.0 - 10.5 K/uL   RBC 4.48 4.22 - 5.81 MIL/uL   Hemoglobin 14.0 13.0 - 17.0 g/dL   HCT 41.5 39.0 - 52.0 %   MCV 92.6 78.0 - 100.0 fL   MCH 31.3 26.0 - 34.0 pg   MCHC 33.7 30.0 - 36.0 g/dL   RDW 16.5 (H) 11.5 - 15.5 %   Platelets PENDING 150 - 400 K/uL   Neutrophils Relative % 74 %   Neutro Abs 5.4 1.7 - 7.7 K/uL   Lymphocytes Relative 12 %   Lymphs Abs 0.8 0.7 - 4.0 K/uL   Monocytes Relative 14 %   Monocytes Absolute 1.0  0.1 - 1.0 K/uL   Eosinophils Relative 1 %   Eosinophils Absolute 0.0 0.0 - 0.7 K/uL   Basophils Relative 0 %   Basophils Absolute 0.0 0.0 - 0.1 K/uL  Basic metabolic panel     Status: Abnormal   Collection Time: 01/08/15  5:07 PM  Result Value Ref Range   Sodium 136 135 - 145 mmol/L   Potassium 4.1 3.5 - 5.1 mmol/L   Chloride 102 101 - 111 mmol/L   CO2 25 22 - 32 mmol/L   Glucose, Bld 95  65 - 99 mg/dL   BUN 17 6 - 20 mg/dL   Creatinine, Ser 0.81 0.61 - 1.24 mg/dL   Calcium 8.2 (L) 8.9 - 10.3 mg/dL   GFR calc non Af Amer >60 >60 mL/min   GFR calc Af Amer >60 >60 mL/min    Comment: (NOTE) The eGFR has been calculated using the CKD EPI equation. This calculation has not been validated in all clinical situations. eGFR's persistently <60 mL/min signify possible Chronic Kidney Disease.    Anion gap 9 5 - 15  Urinalysis, Routine w reflex microscopic (not at [facility])     Status: Abnormal   Collection Time: 01/08/15  5:22 PM  Result Value Ref Range   Color, Urine AMBER (A) YELLOW    Comment: BIOCHEMICALS MAY BE AFFECTED BY COLOR   APPearance CLEAR CLEAR   Specific Gravity, Urine >1.046 (H) 1.005 - 1.030   pH 5.5 5.0 - 8.0   Glucose, UA NEGATIVE NEGATIVE mg/dL   Hgb urine dipstick NEGATIVE NEGATIVE   Bilirubin Urine NEGATIVE NEGATIVE   Ketones, ur NEGATIVE NEGATIVE mg/dL   Protein, ur NEGATIVE NEGATIVE mg/dL   Nitrite NEGATIVE NEGATIVE   Leukocytes, UA NEGATIVE NEGATIVE    Comment: MICROSCOPIC NOT DONE ON URINES WITH NEGATIVE PROTEIN, BLOOD, LEUKOCYTES, NITRITE, OR GLUCOSE <1000 mg/dL.    Ct Abdomen Pelvis W Contrast  01/08/2015  CLINICAL DATA:  Follow up diverticulitis. History of multiple myeloma. EXAM: CT ABDOMEN AND PELVIS WITH CONTRAST TECHNIQUE: Multidetector CT imaging of the abdomen and pelvis was performed using the standard protocol following bolus administration of intravenous contrast. CONTRAST:  158m OMNIPAQUE IOHEXOL 300 MG/ML  SOLN COMPARISON:  PET-CT 08/11/2014 and  12/22/2014. FINDINGS: Lower chest: There are no significant residual findings at the visualized lung bases. There is mild linear right lower lobe scarring. Aortic and coronary artery atherosclerosis noted. Hepatobiliary: The liver is normal in density without focal abnormality. No significant biliary dilatation status post cholecystectomy. Pancreas: Unremarkable. No pancreatic ductal dilatation or surrounding inflammatory changes. Spleen: Normal in size without focal abnormality. Adrenals/Urinary Tract: Both adrenal glands appear normal. Grossly stable left renal cortical cysts and parapelvic cysts bilaterally. No hydronephrosis or urinary tract calculus. The bladder appears unremarkable. Stomach/Bowel: The stomach, small bowel, proximal colon and appendix demonstrate no significant findings. Diverticular changes are again noted throughout the distal colon. There are persistent inflammatory changes surrounding the sigmoid colon with small extraluminal air bubbles and surrounding inflammatory change. There is a fluid collection beneath the anterior abdominal wall which contains air bubbles and measures up to 6.3 x 2.4 cm on image 74. This has mildly enlarged compared with the prior study. There is no free intraperitoneal air or evidence of bowel obstruction. Vascular/Lymphatic: There are no enlarged abdominal or pelvic lymph nodes. Fairly extensive atherosclerosis of the aorta, its branches and the iliac arteries. No evidence of large vessel occlusion. Reproductive: Stable moderate enlargement of the prostate gland without apparent focal abnormality. Other: Stable small umbilical hernia containing only fat. Musculoskeletal: The lytic lesions in the left aspect of the L3 vertebral body and the right aspect of the sacrum are unchanged. The latter measures 2.2 cm on image 67. L4 superior endplate compression deformity appears unchanged. No new lytic lesions or pathologic fracture. IMPRESSION: 1. Persistent changes of  sigmoid diverticulitis without significant improvement over the last 17 days. There is a persistent peridiverticular abscess. No evidence of obstruction. 2. No acute osseous findings or evidence of progressive multiple myeloma. Lytic L3 and right sacral lesions are unchanged. 3.  Stable incidental findings including diffuse atherosclerosis, renal parapelvic cysts and prostatomegaly. 4. These results were called by telephone at the time of interpretation on 01/08/2015 at 2:45 pm to Dr. Ancil Linsey , who verbally acknowledged these results. Electronically Signed   By: Richardean Sale M.D.   On: 01/08/2015 14:50    Review of Systems  Constitutional: Positive for fever and chills.  Respiratory: Positive for shortness of breath.   Cardiovascular: Negative for chest pain.  Gastrointestinal: Negative for nausea, vomiting, abdominal pain and diarrhea.  Neurological: Positive for weakness.   Blood pressure 123/80, pulse 109, temperature 97.3 F (36.3 C), temperature source Oral, resp. rate 22, height _0  (1.727 m), weight 100.699 kg (222 lb), SpO2 98 %. Physical Exam  Constitutional:  Overweight male in NAD.  Eyes: EOM are normal. Pupils are equal, round, and reactive to light.  Cardiovascular: Normal rate and regular rhythm.   Respiratory: Effort normal and breath sounds normal.  GI: Soft.  Mild tenderness in the periumbilical area. No palpable mass.  Musculoskeletal: He exhibits no edema.  Neurological: He is alert.  Skin: Skin is warm.    Assessment: Worsening acute sigmoid diverticulitis by CT with enlarging abscess. He is also immunosuppressed due to his treatment for multiple biloma. The diverticular disease is not responding to outpatient antibiotics.  Plan/recommendation: Admit to the hospital. Broad-spectrum IV antibiotics. Interventional radiology consult tomorrow to evaluate for percutaneous drain placement.  Raelle Chambers J 01/08/2015, 5:54 PM    Addendum:  I feel he will  need to have his Dexamethasone and Revlimid held in order to have a chance at resolving this process non-operatively.

## 2015-01-08 NOTE — ED Notes (Signed)
PA at bedside. Will finish triage when she is finished examining.

## 2015-01-09 ENCOUNTER — Inpatient Hospital Stay (HOSPITAL_COMMUNITY): Payer: Medicare Other

## 2015-01-09 ENCOUNTER — Ambulatory Visit: Payer: Medicare Other | Admitting: Family Medicine

## 2015-01-09 DIAGNOSIS — K651 Peritoneal abscess: Secondary | ICD-10-CM | POA: Insufficient documentation

## 2015-01-09 DIAGNOSIS — C9000 Multiple myeloma not having achieved remission: Secondary | ICD-10-CM

## 2015-01-09 DIAGNOSIS — D696 Thrombocytopenia, unspecified: Secondary | ICD-10-CM

## 2015-01-09 LAB — COMPREHENSIVE METABOLIC PANEL
ALT: 11 U/L — ABNORMAL LOW (ref 17–63)
AST: 11 U/L — ABNORMAL LOW (ref 15–41)
Albumin: 2.1 g/dL — ABNORMAL LOW (ref 3.5–5.0)
Alkaline Phosphatase: 17 U/L — ABNORMAL LOW (ref 38–126)
Anion gap: 4 — ABNORMAL LOW (ref 5–15)
BUN: 15 mg/dL (ref 6–20)
CO2: 26 mmol/L (ref 22–32)
Calcium: 7.6 mg/dL — ABNORMAL LOW (ref 8.9–10.3)
Chloride: 106 mmol/L (ref 101–111)
Creatinine, Ser: 0.66 mg/dL (ref 0.61–1.24)
GFR calc Af Amer: >60 mL/min (ref >60)
GFR calc non Af Amer: >60 mL/min (ref >60)
Glucose, Bld: 115 mg/dL — ABNORMAL HIGH (ref 65–99)
Potassium: 3.7 mmol/L (ref 3.5–5.1)
Sodium: 136 mmol/L (ref 135–145)
Total Bilirubin: 0.9 mg/dL (ref 0.3–1.2)
Total Protein: 4.6 g/dL — ABNORMAL LOW (ref 6.5–8.1)

## 2015-01-09 LAB — CBC WITH DIFFERENTIAL/PLATELET
Basophils Absolute: 0 10*3/uL (ref 0.0–0.1)
Basophils Relative: 0 %
Eosinophils Absolute: 0.1 10*3/uL (ref 0.0–0.7)
Eosinophils Relative: 1 %
HCT: 37.1 % — ABNORMAL LOW (ref 39.0–52.0)
Hemoglobin: 12.3 g/dL — ABNORMAL LOW (ref 13.0–17.0)
Lymphocytes Relative: 10 %
Lymphs Abs: 0.6 10*3/uL — ABNORMAL LOW (ref 0.7–4.0)
MCH: 31.3 pg (ref 26.0–34.0)
MCHC: 33.2 g/dL (ref 30.0–36.0)
MCV: 94.4 fL (ref 78.0–100.0)
Monocytes Absolute: 1 10*3/uL (ref 0.1–1.0)
Monocytes Relative: 17 %
Neutro Abs: 4.1 10*3/uL (ref 1.7–7.7)
Neutrophils Relative %: 72 %
Platelets: 72 10*3/uL — ABNORMAL LOW (ref 150–400)
RBC: 3.93 MIL/uL — ABNORMAL LOW (ref 4.22–5.81)
RDW: 16.4 % — ABNORMAL HIGH (ref 11.5–15.5)
WBC: 5.8 10*3/uL (ref 4.0–10.5)

## 2015-01-09 LAB — PROTIME-INR
INR: 1.28 (ref 0.00–1.49)
Prothrombin Time: 16.1 seconds — ABNORMAL HIGH (ref 11.6–15.2)

## 2015-01-09 MED ORDER — FENTANYL CITRATE (PF) 100 MCG/2ML IJ SOLN
INTRAMUSCULAR | Status: AC
  Filled 2015-01-09: qty 2

## 2015-01-09 MED ORDER — TRAZODONE HCL 50 MG PO TABS
50.0000 mg | ORAL_TABLET | Freq: Once | ORAL | Status: AC
  Administered 2015-01-09: 50 mg via ORAL
  Filled 2015-01-09: qty 1

## 2015-01-09 MED ORDER — MIDAZOLAM HCL 2 MG/2ML IJ SOLN
INTRAMUSCULAR | Status: AC | PRN
  Administered 2015-01-09: 0.5 mg via INTRAVENOUS

## 2015-01-09 MED ORDER — MIDAZOLAM HCL 2 MG/2ML IJ SOLN
INTRAMUSCULAR | Status: AC
  Filled 2015-01-09: qty 4

## 2015-01-09 MED ORDER — DEXAMETHASONE 4 MG PO TABS
20.0000 mg | ORAL_TABLET | ORAL | Status: DC
  Administered 2015-01-12: 20 mg via ORAL
  Filled 2015-01-09 (×2): qty 5

## 2015-01-09 MED ORDER — FENTANYL CITRATE (PF) 100 MCG/2ML IJ SOLN
INTRAMUSCULAR | Status: AC | PRN
  Administered 2015-01-09: 25 ug via INTRAVENOUS

## 2015-01-09 MED ORDER — HYDROCODONE-ACETAMINOPHEN 5-325 MG PO TABS
1.0000 | ORAL_TABLET | ORAL | Status: DC | PRN
  Filled 2015-01-09: qty 2

## 2015-01-09 NOTE — Procedures (Signed)
CT placement of 16F LLQ abscess drain 20ml purulent aspirate sent for GS, C&S No complication No blood loss. See complete dictation in [facility].

## 2015-01-09 NOTE — Progress Notes (Signed)
Subjective: Only real pain has been with coughing.  He notes pain in LLQ.  Currently he is comfortable and awaiting IR to take down for drain placement.    Objective: Vital signs in last 24 hours: Temp:  [97.3 F (36.3 C)-98.3 F (36.8 C)] 98.3 F (36.8 C) (12/06 0535) Pulse Rate:  [36-109] 70 (12/06 0535) Resp:  [16-22] 18 (12/06 0535) BP: (101-148)/(54-80) 109/62 mmHg (12/06 0535) SpO2:  [95 %-98 %] 96 % (12/06 0535) Weight:  [100.4 kg (221 lb 5.5 oz)-100.699 kg (222 lb)] 100.4 kg (221 lb 5.5 oz) (12/05 2013) Last BM Date: 01/08/15 834 IV/urine 550 Afebrile, VSS Labs stable except for the platelets are down to 72K Intake/Output from previous day: 12/05 0701 - 12/06 0700 In: 834 [I.V.:834] Out: 550 [Urine:550] Intake/Output this shift:    General appearance: alert, cooperative and no distress GI: soft, minimal tenderness in the LLQ.  + bS.    Lab Results:   Recent Labs  01/08/15 1707 01/09/15 0425  WBC 7.3 5.8  HGB 14.0 12.3*  HCT 41.5 37.1*  PLT 91* 72*    BMET  Recent Labs  01/08/15 1707 01/09/15 0425  NA 136 136  K 4.1 3.7  CL 102 106  CO2 25 26  GLUCOSE 95 115*  BUN 17 15  CREATININE 0.81 0.66  CALCIUM 8.2* 7.6*   PT/INR No results for input(s): LABPROT, INR in the last 72 hours.   Recent Labs Lab 01/09/15 0425  AST 11*  ALT 11*  ALKPHOS 17*  BILITOT 0.9  PROT 4.6*  ALBUMIN 2.1*     Lipase  No results found for: LIPASE   Studies/Results: Ct Abdomen Pelvis W Contrast  01/08/2015  CLINICAL DATA:  Follow up diverticulitis. History of multiple myeloma. EXAM: CT ABDOMEN AND PELVIS WITH CONTRAST TECHNIQUE: Multidetector CT imaging of the abdomen and pelvis was performed using the standard protocol following bolus administration of intravenous contrast. CONTRAST:  131m OMNIPAQUE IOHEXOL 300 MG/ML  SOLN COMPARISON:  PET-CT 08/11/2014 and 12/22/2014. FINDINGS: Lower chest: There are no significant residual findings at the visualized lung  bases. There is mild linear right lower lobe scarring. Aortic and coronary artery atherosclerosis noted. Hepatobiliary: The liver is normal in density without focal abnormality. No significant biliary dilatation status post cholecystectomy. Pancreas: Unremarkable. No pancreatic ductal dilatation or surrounding inflammatory changes. Spleen: Normal in size without focal abnormality. Adrenals/Urinary Tract: Both adrenal glands appear normal. Grossly stable left renal cortical cysts and parapelvic cysts bilaterally. No hydronephrosis or urinary tract calculus. The bladder appears unremarkable. Stomach/Bowel: The stomach, small bowel, proximal colon and appendix demonstrate no significant findings. Diverticular changes are again noted throughout the distal colon. There are persistent inflammatory changes surrounding the sigmoid colon with small extraluminal air bubbles and surrounding inflammatory change. There is a fluid collection beneath the anterior abdominal wall which contains air bubbles and measures up to 6.3 x 2.4 cm on image 74. This has mildly enlarged compared with the prior study. There is no free intraperitoneal air or evidence of bowel obstruction. Vascular/Lymphatic: There are no enlarged abdominal or pelvic lymph nodes. Fairly extensive atherosclerosis of the aorta, its branches and the iliac arteries. No evidence of large vessel occlusion. Reproductive: Stable moderate enlargement of the prostate gland without apparent focal abnormality. Other: Stable small umbilical hernia containing only fat. Musculoskeletal: The lytic lesions in the left aspect of the L3 vertebral body and the right aspect of the sacrum are unchanged. The latter measures 2.2 cm on image 67. L4 superior  endplate compression deformity appears unchanged. No new lytic lesions or pathologic fracture. IMPRESSION: 1. Persistent changes of sigmoid diverticulitis without significant improvement over the last 17 days. There is a persistent  peridiverticular abscess. No evidence of obstruction. 2. No acute osseous findings or evidence of progressive multiple myeloma. Lytic L3 and right sacral lesions are unchanged. 3. Stable incidental findings including diffuse atherosclerosis, renal parapelvic cysts and prostatomegaly. 4. These results were called by telephone at the time of interpretation on 01/08/2015 at 2:45 pm to Dr. Ancil Linsey , who verbally acknowledged these results. Electronically Signed   By: Richardean Sale M.D.   On: 01/08/2015 14:50    Medications: . acyclovir  400 mg Oral BID  . pantoprazole (PROTONIX) IV  40 mg Intravenous Q24H  . piperacillin-tazobactam (ZOSYN)  IV  3.375 g Intravenous 3 times per day    Assessment/Plan Acute sigmoid diverticulitis with enlarging 6.3 x 2.4 cm diverticular abscess Multiple Myeloma on Zometa, Dexamethasone, and lenalidomide. Hx of radiation therapy to right chest, spine and left occipital  condyle Neuropathy Hx o f ETOH abuse Thrombocytopenia  Antibiotics:  Zosyn/acyclovir day 2 DVT:  Adding SCD's/currently platelets low, defer to Medicine.   Plan:  IV antibiotics, IR drain today.     LOS: 1 day    Asmi Fugere 01/09/2015

## 2015-01-09 NOTE — Progress Notes (Signed)
[facility]                                                                              Patient Demographics  Dean [name], is a 79 y.o. male, DOB - [birth year], YYQ:[id number]  Admit date - 01/08/2015   Admitting Physician Gennaro Africa, MD  Outpatient Primary MD for the patient is Dean Fraction, MD  LOS - 1   Chief Complaint  Patient presents with  . Abdominal Abscess       HPI on 01/08/2015 by Dr. Gennaro Africa Patient with history of MM diagnosed in Jan 2016 on Revlimid every other day who was found to have peridiverticular abscess/diverticulitis incidentally in a PET scan done 2 weeks ago with failing of outpatient management for the past two weeks. He has been taken Flagyl and Cipro for the past two weeks. Repeat CT scan today shows increase in abscess size and persistence over diverticulitis. Patient has no complaints except for fatigue/weakness that started back in January and has been progressing. He had fever/chills for the past two days but denied any abdominal pain, N/V, diarrhea, blood in stool. No other complaints.   Assessment & Plan   Acute sigmoid diverticulitis with enlarging diverticular abscess -Noted on PET scan done approximately 2 weeks ago. Patient failed outpatient treatment with ciprofloxacin and Flagyl -CT abdomen just persistent changes of sigmoid diverticulitis without significant improvement over the last 17 days, persistent peridiverticular abscess -Continue IV Zosyn -Gen. surgery consulted and appreciated. IR consulted and appreciated for abscess drainage -Continue pain control and antiemetics as needed  Multiple myeloma -Revlimid held.  -Patient follows with Dr. Whitney Muse, oncologist  Thrombocytopenia -Likely secondary to chemo -will continue to monitor CBC  Code Status: Full  Family Communication: None at bedside  Disposition Plan: Admitted.    Time Spent in minutes   30 minutes  Procedures  None  Consults   General  surgery Interventional radiology  DVT Prophylaxis  SCDs  Lab Results  Component Value Date   PLT 72* 01/09/2015    Medications  Scheduled Meds: . acyclovir  400 mg Oral BID  . fentaNYL      . midazolam      . pantoprazole (PROTONIX) IV  40 mg Intravenous Q24H  . piperacillin-tazobactam (ZOSYN)  IV  3.375 g Intravenous 3 times per day   Continuous Infusions: . dextrose 5 % and 0.9% NaCl 100 mL/hr at 01/09/15 0821   PRN Meds:.ALPRAZolam  Antibiotics    Anti-infectives    Start     Dose/Rate Route Frequency Ordered Stop   01/08/15 2200  piperacillin-tazobactam (ZOSYN) IVPB 3.375 g     3.375 g 12.5 mL/hr over 240 Minutes Intravenous 3 times per day 01/08/15 1804     01/08/15 2200  acyclovir (ZOVIRAX) tablet 400 mg     400 mg Oral 2 times daily 01/08/15 1925     01/08/15 1745  metroNIDAZOLE (FLAGYL) IVPB 1 g  Status:  Discontinued     1 g 200 mL/hr over 60 Minutes Intravenous  Once 01/08/15 1733 01/08/15 1804   01/08/15 1745  cefTRIAXone (ROCEPHIN) 1 g in dextrose 5 % 50 mL IVPB  Status:  Discontinued  1 g 100 mL/hr over 30 Minutes Intravenous  Once 01/08/15 1733 01/08/15 1804      Subjective:   [name] seen and examined today.  Patient states he has some left lower quadrant pain and discomfort although it is intermittent. He denies any chest pain, shortness of breath, dizziness, headache, nausea, vomiting diarrhea or constipation.  Objective:   Filed Vitals:   01/08/15 1915 01/08/15 1933 01/08/15 2013 01/09/15 0535  BP:  109/67 148/63 109/62  Pulse: 94 42 83 70  Temp:   98.3 F (36.8 C) 98.3 F (36.8 C)  TempSrc:   Axillary Oral  Resp: _0 Height:   5' 8" (1.727 m)   Weight:   100.4 kg (221 lb 5.5 oz)   SpO2:  96% 95% 96%    Wt Readings from Last 3 Encounters:  01/08/15 100.4 kg (221 lb 5.5 oz)  12/25/14 100.835 kg (222 lb 4.8 oz)  11/27/14 101.696 kg (224 lb 3.2 oz)     Intake/Output Summary (Last 24 hours) at 01/09/15 1130 Last  data filed at 01/09/15 0600  Gross per 24 hour  Intake    834 ml  Output    550 ml  Net    284 ml    Exam  General: Well developed, well nourished, NAD, appears stated age  23: NCAT,mucous membranes moist.   Cardiovascular: S1 S2 auscultated, no rubs, murmurs or gallops. Regular rate and rhythm.  Respiratory: Clear to auscultation bilaterally with equal chest rise  Abdomen: Soft, LLQ TTP, nondistended, + bowel sounds  Extremities: warm dry without cyanosis clubbing or edema  Neuro: AAOx3, nonfocal  Psych: Normal affect and demeanor with intact judgement and insight  Data Review   Micro Results No results found for this or any previous visit (from the past 240 hour(s)).  Radiology Reports Ct Abdomen Pelvis W Contrast  01/08/2015  CLINICAL DATA:  Follow up diverticulitis. History of multiple myeloma. EXAM: CT ABDOMEN AND PELVIS WITH CONTRAST TECHNIQUE: Multidetector CT imaging of the abdomen and pelvis was performed using the standard protocol following bolus administration of intravenous contrast. CONTRAST:  181m OMNIPAQUE IOHEXOL 300 MG/ML  SOLN COMPARISON:  PET-CT 08/11/2014 and 12/22/2014. FINDINGS: Lower chest: There are no significant residual findings at the visualized lung bases. There is mild linear right lower lobe scarring. Aortic and coronary artery atherosclerosis noted. Hepatobiliary: The liver is normal in density without focal abnormality. No significant biliary dilatation status post cholecystectomy. Pancreas: Unremarkable. No pancreatic ductal dilatation or surrounding inflammatory changes. Spleen: Normal in size without focal abnormality. Adrenals/Urinary Tract: Both adrenal glands appear normal. Grossly stable left renal cortical cysts and parapelvic cysts bilaterally. No hydronephrosis or urinary tract calculus. The bladder appears unremarkable. Stomach/Bowel: The stomach, small bowel, proximal colon and appendix demonstrate no significant findings. Diverticular  changes are again noted throughout the distal colon. There are persistent inflammatory changes surrounding the sigmoid colon with small extraluminal air bubbles and surrounding inflammatory change. There is a fluid collection beneath the anterior abdominal wall which contains air bubbles and measures up to 6.3 x 2.4 cm on image 74. This has mildly enlarged compared with the prior study. There is no free intraperitoneal air or evidence of bowel obstruction. Vascular/Lymphatic: There are no enlarged abdominal or pelvic lymph nodes. Fairly extensive atherosclerosis of the aorta, its branches and the iliac arteries. No evidence of large vessel occlusion. Reproductive: Stable moderate enlargement of the prostate gland without apparent focal abnormality. Other: Stable small umbilical hernia containing only  fat. Musculoskeletal: The lytic lesions in the left aspect of the L3 vertebral body and the right aspect of the sacrum are unchanged. The latter measures 2.2 cm on image 67. L4 superior endplate compression deformity appears unchanged. No new lytic lesions or pathologic fracture. IMPRESSION: 1. Persistent changes of sigmoid diverticulitis without significant improvement over the last 17 days. There is a persistent peridiverticular abscess. No evidence of obstruction. 2. No acute osseous findings or evidence of progressive multiple myeloma. Lytic L3 and right sacral lesions are unchanged. 3. Stable incidental findings including diffuse atherosclerosis, renal parapelvic cysts and prostatomegaly. 4. These results were called by telephone at the time of interpretation on 01/08/2015 at 2:45 pm to Dr. Ancil Linsey , who verbally acknowledged these results. Electronically Signed   By: Richardean Sale M.D.   On: 01/08/2015 14:50   Nm Pet Image Restage (ps) Whole Body  12/22/2014  CLINICAL DATA:  Subsequent treatment strategy for multiple myeloma. EXAM: NUCLEAR MEDICINE PET WHOLE BODY TECHNIQUE: 11.9 mCi F-18 FDG was  injected intravenously. Full-ring PET imaging was performed from the vertex to the feet after the radiotracer. CT data was obtained and used for attenuation correction and anatomic localization. FASTING BLOOD GLUCOSE:  Value:  86 mg/dl COMPARISON:  PET-CT 08/11/2014 and 03/16/2014. FINDINGS: NECK No hypermetabolic cervical lymph nodes are identified.There are no lesions of the pharyngeal mucosal space. There is stable mild symmetric prominence of activity within the submandibular glands bilaterally. CHEST There are no hypermetabolic mediastinal, hilar or axillary lymph nodes. Interval significant improvement in the inflammatory changes in the right upper lobe with associated hypermetabolic activity. There are some residual linear components without hypermetabolic activity. Within the right lower lobe, there is low-level hypermetabolic activity associated with subpleural ground-glass density. This has an SUV max of 5.8 and appears inflammatory. No suspicious pulmonary findings. ABDOMEN/PELVIS There is no hypermetabolic activity within the liver, adrenal glands, spleen or pancreas. There is no hypermetabolic nodal activity. There is extensive sigmoid diverticulosis with increased wall thickening and associated hypermetabolic activity. There is a new small lenticular shaped fluid collection with air bubbles beneath the left anterior abdominal wall, measuring 5.9 x 1.7 cm on image 195. This demonstrates hyper metabolism and is suspicious for a small abscess. No free intraperitoneal air or evidence of bowel obstruction. Moderate enlargement of the prostate gland, diffuse atherosclerosis and bilateral renal cortical and parapelvic cysts are again noted. SKELETON There is decreased metabolic activity throughout the lumbar spine attributed to radiation therapy. There is mild mottled activity throughout the skeleton without focally increased activity to suggest residual active multiple myeloma. The right sacral lesion  measures 2.3 cm on image 192 and has no residual hypermetabolic activity (SUV max 2.5). The L3 lesion appears unchanged in size without residual hypermetabolic activity. Right rib deformities are noted without abnormal metabolic activity. EXTREMITIES No hypermetabolic activity to suggest metastasis. IMPRESSION: 1. No evidence of residual metabolically active multiple myeloma in the axial or appendicular skeleton. The dominant lesions in the L3 vertebral body and right sacrum have not changed in size. 2. No evidence of extra osseous neoplasm. 3. Interval improvement in inflammatory changes in the right lung, possibly related to previous radiation therapy. 4. New hypermetabolism in the sigmoid colon associated with changes of probable diverticulitis and a small peridiverticular abscess. Bowel rest and antibiotic therapy recommended. 5. These results will be called to the ordering clinician or representative by the Radiologist Assistant, and communication documented in the PACS or zVision Dashboard. Electronically Signed   By: Gwyndolyn Saxon  Lin Landsman M.D.   On: 12/22/2014 11:11    CBC  Recent Labs Lab 01/08/15 1707 01/09/15 0425  WBC 7.3 5.8  HGB 14.0 12.3*  HCT 41.5 37.1*  PLT 91* 72*  MCV 92.6 94.4  MCH 31.3 31.3  MCHC 33.7 33.2  RDW 16.5* 16.4*  LYMPHSABS 0.8 0.6*  MONOABS 1.0 1.0  EOSABS 0.0 0.1  BASOSABS 0.0 0.0    Chemistries   Recent Labs Lab 01/08/15 1707 01/09/15 0425  NA 136 136  K 4.1 3.7  CL 102 106  CO2 25 26  GLUCOSE 95 115*  BUN 17 15  CREATININE 0.81 0.66  CALCIUM 8.2* 7.6*  AST  --  11*  ALT  --  11*  ALKPHOS  --  17*  BILITOT  --  0.9   ------------------------------------------------------------------------------------------------------------------ estimated creatinine clearance is 88.8 mL/min (by C-G formula based on Cr of 0.66). ------------------------------------------------------------------------------------------------------------------ No results for  input(s): HGBA1C in the last 72 hours. ------------------------------------------------------------------------------------------------------------------ No results for input(s): CHOL, HDL, LDLCALC, TRIG, CHOLHDL, LDLDIRECT in the last 72 hours. ------------------------------------------------------------------------------------------------------------------ No results for input(s): TSH, T4TOTAL, T3FREE, THYROIDAB in the last 72 hours.  Invalid input(s): FREET3 ------------------------------------------------------------------------------------------------------------------ No results for input(s): VITAMINB12, FOLATE, FERRITIN, TIBC, IRON, RETICCTPCT in the last 72 hours.  Coagulation profile  Recent Labs Lab 01/09/15 1000  INR 1.28    No results for input(s): DDIMER in the last 72 hours.  Cardiac Enzymes No results for input(s): CKMB, TROPONINI, MYOGLOBIN in the last 168 hours.  Invalid input(s): CK ------------------------------------------------------------------------------------------------------------------ Invalid input(s): POCBNP    ,  D.O. on 01/09/2015 at 11:30 AM  Between 7am to 7pm - Pager - [phone]  After 7pm go to [email or web address] - password [email or web address]  And look for the night coverage person covering for me after hours  [facility] Office  [phone]

## 2015-01-09 NOTE — Consult Note (Signed)
Chief Complaint: Patient was seen in consultation today for CT guided drainage of diverticular abscess Chief Complaint  Patient presents with  . Abdominal Abscess     Referring Physician(s): CCS  History of Present Illness: Dean [name] is a 79 y.o. male who is currently being treated for multiple myeloma. He had a PET scan November 18 which demonstrated findings consistent with acute diverticulitis and a small diverticular abscess. He was started on ciprofloxacin and Flagyl. For the past 2 days he began having fevers and chills. Dr. Whitney Muse ordered a follow up CT scan which demonstrated an enlarging diverticular abscess with an air-fluid level. It measured about 6.5 cm. He still has active diverticulitis of the sigmoid colon and some of the descending colon despite medical treatment. He was sent to the emergency department here at rest to evaluate him. He states he is not having any pain or diarrhea. Request has now been received from surgery for CT-guided drainage of the diverticular abscess.  Past Medical History  Diagnosis Date  . Neuropathy ([place])   . BPH (benign prostatic hyperplasia)   . GERD (gastroesophageal reflux disease)   . H/O ETOH abuse   . Skull lesion     Left occipital condyle  . History of radiation therapy 01/05/13-02/10/13    45 gray to left occipital condyle region  . Multiple myeloma ([place])   . Radiation 02/21/14-03/08/14    right posterior chest wall area 30 gray  . Radiation 04/19/14-05/02/14    lumbar spine 25 gray  . Diverticulitis 12/22/14    Past Surgical History  Procedure Laterality Date  . Cyst excision  1959    tail bone    Allergies: Codeine and Morphine and related  Medications: Prior to Admission medications   Medication Sig Start Date End Date Taking? Authorizing Provider  acyclovir (ZOVIRAX) 400 MG tablet Take 1 tablet (400 mg total) by mouth 2 (two) times daily. 06/29/14  Yes Baird Cancer, PA-C  ALPRAZolam Duanne Moron) 1 MG tablet  Take 1-2 tablets (1-2 mg total) by mouth at bedtime as needed for sleep. For anxiety and/or sleep 12/12/14  Yes Baird Cancer, PA-C  aspirin EC 325 MG tablet Take 1 tablet (325 mg total) by mouth daily. 03/09/14  Yes [name], MD  Calcium Carb-Cholecalciferol (CALCIUM-VITAMIN D3) 600-500 MG-UNIT CAPS Take 1 tablet by mouth 2 (two) times daily. With food Patient taking differently: Take 1 tablet by mouth 3 (three) times daily. With food 03/07/14  Yes [name], MD  clotrimazole (LOTRIMIN) 1 % cream Apply 1 application topically 2 (two) times daily. To ear Patient taking differently: Apply 1 application topically 2 (two) times daily as needed (ear). To ear 01/16/14  Yes Alycia Rossetti, MD  dexamethasone (DECADRON) 4 MG tablet Take 10 tablets (38m total) on Days 1, 8, & 15 of chemo. Patient taking differently: Take 20 mg by mouth 2 (two) times a week. Every Tuesday and friday 12/12/14  Yes TManon HildingKefalas, PA-C  diphenhydrAMINE (BENADRYL) 25 MG tablet Take 25 mg by mouth at bedtime as needed for sleep.   Yes Historical Provider, MD  gabapentin (NEURONTIN) 400 MG capsule Take 1 capsule (400 mg total) by mouth 4 (four) times daily. Patient taking differently: Take 400 mg by mouth 2 (two) times daily as needed (pain).  10/02/14  Yes TManon HildingKefalas, PA-C  lenalidomide (REVLIMID) 10 MG capsule Take 1 capsule (10 mg total) by mouth every other day. 12/20/14  Yes TMarcello Moores  S Kefalas, PA-C  omeprazole (PRILOSEC) 20 MG capsule Take 1 capsule (20 mg total) by mouth daily. Patient taking differently: Take 20 mg by mouth daily as needed (heartburn/acid reflux).  05/10/14  Yes Manon Hilding Kefalas, PA-C  ondansetron (ZOFRAN) 8 MG tablet Take 1 tablet (8 mg total) by mouth every 8 (eight) hours as needed for nausea or vomiting. 03/10/14  Yes [name], MD  oxyCODONE-acetaminophen (PERCOCET) 10-325 MG tablet Take 1 tablet by mouth every 4 (four) hours as needed for pain. 12/25/14  Yes [name],  MD  PRESCRIPTION MEDICATION Chemo-Revlimid, at [facility] at [place] Provider, MD  tamsulosin (FLOMAX) 0.4 MG CAPS capsule Take 1 capsule (0.4 mg total) by mouth at bedtime. 10/02/14  Yes Baird Cancer, PA-C  Zoledronic Acid (ZOMETA IV) Inject into the vein. Every 28 days   Yes Historical Provider, MD  ciprofloxacin (CIPRO) 500 MG tablet Take 1 tablet (500 mg total) by mouth 2 (two) times daily. Patient not taking: Reported on 01/08/2015 12/22/14   [name], MD  cyclobenzaprine (FLEXERIL) 10 MG tablet Take 1 tablet (10 mg total) by mouth 3 (three) times daily as needed for muscle spasms. Patient not taking: Reported on 11/27/2014 11/10/14   [name], MD  doxepin [facility]) 10 MG capsule Take one at night for sleep Patient not taking: Reported on 12/25/2014 11/27/14   [name], MD  Doxepin HCl (SILENOR) 6 MG TABS Take 1 tablet (6 mg total) by mouth at bedtime. Patient not taking: Reported on 12/25/2014 05/19/14   [name], MD  metroNIDAZOLE (FLAGYL) 500 MG tablet Take 1 tablet (500 mg total) by mouth 3 (three) times daily. Patient not taking: Reported on 01/08/2015 12/22/14   [name], MD     Family History  Problem Relation Age of Onset  . Diabetes Mother   . Stroke Mother   . Hypertension Father     Social History   Social History  . Marital Status: Married    Spouse Name: N/A  . Number of Children: 3  . Years of Education: N/A   Occupational History  .     Social History Main Topics  . Smoking status: Never Smoker   . Smokeless tobacco: Never Used  . Alcohol Use: No     Comment: " Not much no more"  . Drug Use: No  . Sexual Activity: Not Asked   Other Topics Concern  . None   Social History Narrative      Review of Systems  Constitutional:       Recent fevers/chills  Respiratory: Negative for cough and shortness of breath.   Cardiovascular: Negative for chest pain.  Gastrointestinal: Positive for  abdominal pain. Negative for nausea, vomiting and blood in stool.  Genitourinary: Negative for dysuria and hematuria.  Musculoskeletal: Negative for back pain.  Neurological: Negative for headaches.    Vital Signs: BP 109/62 mmHg  Pulse 70  Temp(Src) 98.3 F (36.8 C) (Oral)  Resp 18  Ht 5' 8"  (1.727 m)  Wt 221 lb 5.5 oz (100.4 kg)  BMI 33.66 kg/m2  SpO2 96%  Physical Exam  Constitutional: He is oriented to person, place, and time. He appears well-developed and well-nourished.  Cardiovascular: Normal rate and regular rhythm.   Pulmonary/Chest: Effort normal and breath sounds normal.  Abdominal: Soft. Bowel sounds are normal. There is tenderness.  Musculoskeletal: Normal range of motion. He exhibits no edema.  Neurological: He is  alert and oriented to person, place, and time.    Mallampati Score:     Imaging: Ct Abdomen Pelvis W Contrast  01/08/2015  CLINICAL DATA:  Follow up diverticulitis. History of multiple myeloma. EXAM: CT ABDOMEN AND PELVIS WITH CONTRAST TECHNIQUE: Multidetector CT imaging of the abdomen and pelvis was performed using the standard protocol following bolus administration of intravenous contrast. CONTRAST:  134m OMNIPAQUE IOHEXOL 300 MG/ML  SOLN COMPARISON:  PET-CT 08/11/2014 and 12/22/2014. FINDINGS: Lower chest: There are no significant residual findings at the visualized lung bases. There is mild linear right lower lobe scarring. Aortic and coronary artery atherosclerosis noted. Hepatobiliary: The liver is normal in density without focal abnormality. No significant biliary dilatation status post cholecystectomy. Pancreas: Unremarkable. No pancreatic ductal dilatation or surrounding inflammatory changes. Spleen: Normal in size without focal abnormality. Adrenals/Urinary Tract: Both adrenal glands appear normal. Grossly stable left renal cortical cysts and parapelvic cysts bilaterally. No hydronephrosis or urinary tract calculus. The bladder appears unremarkable.  Stomach/Bowel: The stomach, small bowel, proximal colon and appendix demonstrate no significant findings. Diverticular changes are again noted throughout the distal colon. There are persistent inflammatory changes surrounding the sigmoid colon with small extraluminal air bubbles and surrounding inflammatory change. There is a fluid collection beneath the anterior abdominal wall which contains air bubbles and measures up to 6.3 x 2.4 cm on image 74. This has mildly enlarged compared with the prior study. There is no free intraperitoneal air or evidence of bowel obstruction. Vascular/Lymphatic: There are no enlarged abdominal or pelvic lymph nodes. Fairly extensive atherosclerosis of the aorta, its branches and the iliac arteries. No evidence of large vessel occlusion. Reproductive: Stable moderate enlargement of the prostate gland without apparent focal abnormality. Other: Stable small umbilical hernia containing only fat. Musculoskeletal: The lytic lesions in the left aspect of the L3 vertebral body and the right aspect of the sacrum are unchanged. The latter measures 2.2 cm on image 67. L4 superior endplate compression deformity appears unchanged. No new lytic lesions or pathologic fracture. IMPRESSION: 1. Persistent changes of sigmoid diverticulitis without significant improvement over the last 17 days. There is a persistent peridiverticular abscess. No evidence of obstruction. 2. No acute osseous findings or evidence of progressive multiple myeloma. Lytic L3 and right sacral lesions are unchanged. 3. Stable incidental findings including diffuse atherosclerosis, renal parapelvic cysts and prostatomegaly. 4. These results were called by telephone at the time of interpretation on 01/08/2015 at 2:45 pm to Dr. SAncil Linsey, who verbally acknowledged these results. Electronically Signed   By: WRichardean SaleM.D.   On: 01/08/2015 14:50   Nm Pet Image Restage (ps) Whole Body  12/22/2014  CLINICAL DATA:   Subsequent treatment strategy for multiple myeloma. EXAM: NUCLEAR MEDICINE PET WHOLE BODY TECHNIQUE: 11.9 mCi F-18 FDG was injected intravenously. Full-ring PET imaging was performed from the vertex to the feet after the radiotracer. CT data was obtained and used for attenuation correction and anatomic localization. FASTING BLOOD GLUCOSE:  Value:  86 mg/dl COMPARISON:  PET-CT 08/11/2014 and 03/16/2014. FINDINGS: NECK No hypermetabolic cervical lymph nodes are identified.There are no lesions of the pharyngeal mucosal space. There is stable mild symmetric prominence of activity within the submandibular glands bilaterally. CHEST There are no hypermetabolic mediastinal, hilar or axillary lymph nodes. Interval significant improvement in the inflammatory changes in the right upper lobe with associated hypermetabolic activity. There are some residual linear components without hypermetabolic activity. Within the right lower lobe, there is low-level hypermetabolic activity associated with subpleural ground-glass density.  This has an SUV max of 5.8 and appears inflammatory. No suspicious pulmonary findings. ABDOMEN/PELVIS There is no hypermetabolic activity within the liver, adrenal glands, spleen or pancreas. There is no hypermetabolic nodal activity. There is extensive sigmoid diverticulosis with increased wall thickening and associated hypermetabolic activity. There is a new small lenticular shaped fluid collection with air bubbles beneath the left anterior abdominal wall, measuring 5.9 x 1.7 cm on image 195. This demonstrates hyper metabolism and is suspicious for a small abscess. No free intraperitoneal air or evidence of bowel obstruction. Moderate enlargement of the prostate gland, diffuse atherosclerosis and bilateral renal cortical and parapelvic cysts are again noted. SKELETON There is decreased metabolic activity throughout the lumbar spine attributed to radiation therapy. There is mild mottled activity throughout  the skeleton without focally increased activity to suggest residual active multiple myeloma. The right sacral lesion measures 2.3 cm on image 192 and has no residual hypermetabolic activity (SUV max 2.5). The L3 lesion appears unchanged in size without residual hypermetabolic activity. Right rib deformities are noted without abnormal metabolic activity. EXTREMITIES No hypermetabolic activity to suggest metastasis. IMPRESSION: 1. No evidence of residual metabolically active multiple myeloma in the axial or appendicular skeleton. The dominant lesions in the L3 vertebral body and right sacrum have not changed in size. 2. No evidence of extra osseous neoplasm. 3. Interval improvement in inflammatory changes in the right lung, possibly related to previous radiation therapy. 4. New hypermetabolism in the sigmoid colon associated with changes of probable diverticulitis and a small peridiverticular abscess. Bowel rest and antibiotic therapy recommended. 5. These results will be called to the ordering clinician or representative by the Radiologist Assistant, and communication documented in the PACS or zVision Dashboard. Electronically Signed   By: Richardean Sale M.D.   On: 12/22/2014 11:11    Labs:  CBC:  Recent Labs  10/27/14 0839 11/27/14 0915 01/08/15 1707 01/09/15 0425  WBC 7.0 6.6 7.3 5.8  HGB 13.9 13.9 14.0 12.3*  HCT 41.6 41.1 41.5 37.1*  PLT 136* 105* 91* 72*    COAGS:  Recent Labs  02/20/14 0725  INR 1.06  APTT 29    BMP:  Recent Labs  11/27/14 0915 12/25/14 0933 01/08/15 1707 01/09/15 0425  NA 136 142 136 136  K 3.9 4.3 4.1 3.7  CL 104 109 102 106  CO2 22 26 25 26   GLUCOSE 116* 83 95 115*  BUN 17 22* 17 15  CALCIUM 8.8* 8.4* 8.2* 7.6*  CREATININE 0.81 0.77 0.81 0.66  GFRNONAA >60 >60 >60 >60  GFRAA >60 >60 >60 >60    LIVER FUNCTION TESTS:  Recent Labs  10/27/14 0839 11/27/14 0915 12/25/14 1020 01/09/15 0425  BILITOT 0.6 0.7 0.5 0.9  AST 23 22 22  11*  ALT  16* 18 21 11*  ALKPHOS 25* 28* 27* 17*  PROT 5.5* 5.6* 5.7* 4.6*  ALBUMIN 3.1* 2.9* 2.9* 2.1*    TUMOR MARKERS: No results for input(s): AFPTM, CEA, CA199, CHROMGRNA in the last 8760 hours.  Assessment and Plan: Patient with history of diverticular abscess. Request now received for CT guided drainage of the abscess. Patient currently afebrile. WBC 5.8, platelets 72k. Imaging studies were reviewed by Dr. Vernard Gambles. Details/risks of procedure, including but not limited to, internal bleeding, infection, organ injury, discussed with patient and family with their understanding and consent. Procedure scheduled for later today.   Thank you for this interesting consult.  I greatly enjoyed meeting Isamu Trammel and look forward to participating in  their care.  A copy of this report was sent to the requesting provider on this date.  Signed: D. Rowe Robert 01/09/2015, 9:22 AM   I spent a total of 20 minutes in face to face in clinical consultation, greater than 50% of which was counseling/coordinating care for CT-guided drainage of the diverticular abscess

## 2015-01-10 DIAGNOSIS — K5720 Diverticulitis of large intestine with perforation and abscess without bleeding: Principal | ICD-10-CM

## 2015-01-10 LAB — BASIC METABOLIC PANEL
Anion gap: 5 (ref 5–15)
BUN: 11 mg/dL (ref 6–20)
CO2: 22 mmol/L (ref 22–32)
Calcium: 7.4 mg/dL — ABNORMAL LOW (ref 8.9–10.3)
Chloride: 110 mmol/L (ref 101–111)
Creatinine, Ser: 0.68 mg/dL (ref 0.61–1.24)
GFR calc Af Amer: >60 mL/min (ref >60)
GFR calc non Af Amer: >60 mL/min (ref >60)
Glucose, Bld: 101 mg/dL — ABNORMAL HIGH (ref 65–99)
Potassium: 3.6 mmol/L (ref 3.5–5.1)
Sodium: 137 mmol/L (ref 135–145)

## 2015-01-10 LAB — CBC
HCT: 35.4 % — ABNORMAL LOW (ref 39.0–52.0)
Hemoglobin: 11.6 g/dL — ABNORMAL LOW (ref 13.0–17.0)
MCH: 30.7 pg (ref 26.0–34.0)
MCHC: 32.8 g/dL (ref 30.0–36.0)
MCV: 93.7 fL (ref 78.0–100.0)
Platelets: 82 10*3/uL — ABNORMAL LOW (ref 150–400)
RBC: 3.78 MIL/uL — ABNORMAL LOW (ref 4.22–5.81)
RDW: 16.2 % — ABNORMAL HIGH (ref 11.5–15.5)
WBC: 4 10*3/uL (ref 4.0–10.5)

## 2015-01-10 MED ORDER — SACCHAROMYCES BOULARDII 250 MG PO CAPS
250.0000 mg | ORAL_CAPSULE | Freq: Two times a day (BID) | ORAL | Status: DC
  Administered 2015-01-10 – 2015-01-15 (×11): 250 mg via ORAL
  Filled 2015-01-10 (×11): qty 1

## 2015-01-10 NOTE — Progress Notes (Signed)
Subjective: He feels better, very bored.  Drainage is a cloudy reddish brown, old blood-purulent appearing fluid, there is about 30 ml in the gravity bag.  Not much pain or discomfort.  Objective: Vital signs in last 24 hours: Temp:  [98 F (36.7 C)-99 F (37.2 C)] 98 F (36.7 C) (12/07 0648) Pulse Rate:  [68-76] 74 (12/07 0648) Resp:  [12-22] 20 (12/07 0648) BP: (95-127)/(41-69) 108/63 mmHg (12/07 0648) SpO2:  [96 %-99 %] 96 % (12/07 0648) Last BM Date: 01/08/15 475 urine Drainage from IR drain not recorded Afebrile, VSS Labs OK Drain by IR yesterday 5 ml aspirated: Culture gram stain ABUNDANT WBC PRESENT,BOTH PMN AND MONONUCLEAR  NO SQUAMOUS EPITHELIAL CELLS SEEN  ABUNDANT GRAM POSITIVE RODS  MODERATE GRAM POSITIVE COCCI  IN PAIRS            Intake/Output from previous day: 12/06 0701 - 12/07 0700 In: 10  Out: 475 [Urine:475] Intake/Output this shift: Total I/O In: -  Out: 200 [Urine:200]  General appearance: alert, cooperative and no distress GI: soft, not really having pain, he is a little sore at the drain site. not may bowel sounds.  Lab Results:   Recent Labs  01/09/15 0425 01/10/15 0410  WBC 5.8 4.0  HGB 12.3* 11.6*  HCT 37.1* 35.4*  PLT 72* 82*    BMET  Recent Labs  01/09/15 0425 01/10/15 0410  NA 136 137  K 3.7 3.6  CL 106 110  CO2 26 22  GLUCOSE 115* 101*  BUN 15 11  CREATININE 0.66 0.68  CALCIUM 7.6* 7.4*   PT/INR  Recent Labs  01/09/15 1000  LABPROT 16.1*  INR 1.28     Recent Labs Lab 01/09/15 0425  AST 11*  ALT 11*  ALKPHOS 17*  BILITOT 0.9  PROT 4.6*  ALBUMIN 2.1*     Lipase  No results found for: LIPASE   Studies/Results: Ct Abdomen Pelvis W Contrast  01/08/2015  CLINICAL DATA:  Follow up diverticulitis. History of multiple myeloma. EXAM: CT ABDOMEN AND PELVIS WITH CONTRAST TECHNIQUE: Multidetector CT imaging of the abdomen and pelvis was performed using the standard protocol following bolus  administration of intravenous contrast. CONTRAST:  167m OMNIPAQUE IOHEXOL 300 MG/ML  SOLN COMPARISON:  PET-CT 08/11/2014 and 12/22/2014. FINDINGS: Lower chest: There are no significant residual findings at the visualized lung bases. There is mild linear right lower lobe scarring. Aortic and coronary artery atherosclerosis noted. Hepatobiliary: The liver is normal in density without focal abnormality. No significant biliary dilatation status post cholecystectomy. Pancreas: Unremarkable. No pancreatic ductal dilatation or surrounding inflammatory changes. Spleen: Normal in size without focal abnormality. Adrenals/Urinary Tract: Both adrenal glands appear normal. Grossly stable left renal cortical cysts and parapelvic cysts bilaterally. No hydronephrosis or urinary tract calculus. The bladder appears unremarkable. Stomach/Bowel: The stomach, small bowel, proximal colon and appendix demonstrate no significant findings. Diverticular changes are again noted throughout the distal colon. There are persistent inflammatory changes surrounding the sigmoid colon with small extraluminal air bubbles and surrounding inflammatory change. There is a fluid collection beneath the anterior abdominal wall which contains air bubbles and measures up to 6.3 x 2.4 cm on image 74. This has mildly enlarged compared with the prior study. There is no free intraperitoneal air or evidence of bowel obstruction. Vascular/Lymphatic: There are no enlarged abdominal or pelvic lymph nodes. Fairly extensive atherosclerosis of the aorta, its branches and the iliac arteries. No evidence of large vessel occlusion. Reproductive: Stable moderate enlargement of the prostate gland without apparent  focal abnormality. Other: Stable small umbilical hernia containing only fat. Musculoskeletal: The lytic lesions in the left aspect of the L3 vertebral body and the right aspect of the sacrum are unchanged. The latter measures 2.2 cm on image 67. L4 superior endplate  compression deformity appears unchanged. No new lytic lesions or pathologic fracture. IMPRESSION: 1. Persistent changes of sigmoid diverticulitis without significant improvement over the last 17 days. There is a persistent peridiverticular abscess. No evidence of obstruction. 2. No acute osseous findings or evidence of progressive multiple myeloma. Lytic L3 and right sacral lesions are unchanged. 3. Stable incidental findings including diffuse atherosclerosis, renal parapelvic cysts and prostatomegaly. 4. These results were called by telephone at the time of interpretation on 01/08/2015 at 2:45 pm to Dr. Ancil Linsey , who verbally acknowledged these results. Electronically Signed   By: Richardean Sale M.D.   On: 01/08/2015 14:50   Ct Image Guided Drainage By Percutaneous Catheter  01/09/2015  CLINICAL DATA:  Diverticular abscess EXAM: CT GUIDED DRAINAGE OF LEFT LOWER QUADRANT PERITONEAL ABSCESS ANESTHESIA/SEDATION: Intravenous Fentanyl and Versed were administered as conscious sedation during continuous cardiorespiratory monitoring by the radiology RN, with a total moderate sedation time of 11 minutes. PROCEDURE: The procedure, risks, benefits, and alternatives were explained to the patient. Questions regarding the procedure were encouraged and answered. The patient understands and consents to the procedure. patient placed supine. select axial scans through the lower abdomen were obtained. the left lower quadrant extraluminal gas and fluid collection was localized and an appropriate skin entry site was determined and marked. The operative field was prepped with Betadinein a sterile fashion, and a sterile drape was applied covering the operative field. A sterile gown and sterile gloves were used for the procedure. Local anesthesia was provided with 1% Lidocaine. Under CT fluoroscopic guidance, a 19 gauge percutaneous entry needle was advanced into the superficial left lower quadrant peritoneal collection.  Purulent material could be aspirated. An Amplatz guidewire advanced easily within the collection, its position confirmed on CT fluoroscopy. The tract was dilated to facilitate placement of a 12 French pigtail catheter, formed centrally within the collection. 5 mL of purulent aspirate were removed and sent for Gram stain, culture and sensitivity. The catheter was secured externally with 0 Prolene suture and StatLock and placed to gravity drain bag. The patient tolerated the procedure well. COMPLICATIONS: None immediate FINDINGS: The left lower quadrant superficial complex peritoneal collection measured approximately 6.1 cm maximum transverse diameter was again localized. Percutaneous drain catheter placed as above. IMPRESSION: 1. Technically successful CT-guided percutaneous peritoneal abscess drain catheter placement. Sample of the aspirate sent for Gram stain, culture and sensitivity. Electronically Signed   By: Lucrezia Europe M.D.   On: 01/09/2015 15:11    Medications: . acyclovir  400 mg Oral BID  . [START ON 01/12/2015] dexamethasone  20 mg Oral Once per day on Tue Fri  . pantoprazole (PROTONIX) IV  40 mg Intravenous Q24H  . piperacillin-tazobactam (ZOSYN)  IV  3.375 g Intravenous 3 times per day    Assessment/Plan Acute sigmoid diverticulitis with enlarging 6.3 x 2.4 cm diverticular abscess IR drain placement 01/09/15   Multiple Myeloma on Zometa, Dexamethasone, and lenalidomide. Hx of radiation therapy to right chest, spine and left occipital condyle Neuropathy Hx o f ETOH abuse Thrombocytopenia  Antibiotics: Zosyn/acyclovir day 3 DVT: Adding SCD's/currently platelets low, defer to Medicine.   Plan:  I am going to keep him on clears for now, continue drainage and antibiotics.  He will get repeat CT  and drain injection in a few days.   LOS: 2 days    , 01/10/2015

## 2015-01-10 NOTE — Progress Notes (Signed)
[facility] Complaint  Patient presents with  . Abdominal Abscess       HPI on 01/08/2015 by Dr. Gennaro Africa Patient with history of MM diagnosed in Jan 2016 on Revlimid every other day who was found to have peridiverticular abscess/diverticulitis incidentally in a PET scan done 2 weeks ago with failing of outpatient management for the past two weeks. He has been taken Flagyl and Cipro for the past two weeks. Repeat CT scan today shows increase in abscess size and persistence over diverticulitis. Patient has no complaints except for fatigue/weakness that started back in January and has been progressing. He had fever/chills for the past two days but denied any abdominal pain, N/V, diarrhea, blood in stool. No other complaints.   Assessment & Plan   Acute sigmoid diverticulitis with enlarging diverticular abscess -Noted on PET scan done approximately 2 weeks ago. Patient failed outpatient treatment with ciprofloxacin and Flagyl -CT abdomen just persistent changes of sigmoid diverticulitis without significant improvement over the last 17 days, persistent peridiverticular abscess -Continue IV Zosyn -Gen. surgery consulted and appreciated. IR performed perc drain in  LLQ Ct guided on 12/6 -Continue pain control and antiemetics as neede  Multiple myeloma -Revlimid held.  -Patient follows with Dr. Whitney Muse, oncologist  Thrombocytopenia -Likely secondary to chemo -will continue to monitor CBC  Code Status: Full  Family Communication: None at bedside  Disposition Plan: Admitted.    Time Spent in minutes   30 minutes  Procedures  None  Consults   General surgery Interventional radiology  DVT Prophylaxis  SCDs  Lab Results  Component Value Date   PLT 82* 01/10/2015    Medications  Scheduled Meds: . acyclovir  400 mg Oral BID  . [START ON 01/12/2015] dexamethasone  20 mg Oral Once per  day on Tue Fri  . pantoprazole (PROTONIX) IV  40 mg Intravenous Q24H  . piperacillin-tazobactam (ZOSYN)  IV  3.375 g Intravenous 3 times per day  . saccharomyces boulardii  250 mg Oral BID   Continuous Infusions: . dextrose 5 % and 0.9% NaCl 100 mL/hr at 01/09/15 0821   PRN Meds:.ALPRAZolam, HYDROcodone-acetaminophen  Antibiotics    Anti-infectives    Start     Dose/Rate Route Frequency Ordered Stop   01/08/15 2200  piperacillin-tazobactam (ZOSYN) IVPB 3.375 g     3.375 g 12.5 mL/hr over 240 Minutes Intravenous 3 times per day 01/08/15 1804     01/08/15 2200  acyclovir (ZOVIRAX) tablet 400 mg     400 mg Oral 2 times daily 01/08/15 1925     01/08/15 1745  metroNIDAZOLE (FLAGYL) IVPB 1 g  Status:  Discontinued     1 g 200 mL/hr over 60 Minutes Intravenous  Once 01/08/15 1733 01/08/15 1804   01/08/15 1745  cefTRIAXone (ROCEPHIN) 1 g in dextrose 5 % 50 mL IVPB  Status:  Discontinued     1 g 100 mL/hr over 30 Minutes Intravenous  Once 01/08/15 1733 01/08/15 1804      Subjective:   Wel Adb pain much imporved  No n/v/cp.  Objective:   Filed Vitals:   01/09/15 1222 01/09/15 1435 01/09/15 2059 01/10/15 0648  BP: 101/41 113/58 127/69 108/63  Pulse: 76 68  74  Temp:  98.5 F (36.9 C) 99 F (37.2 C) 98 F (36.7 C)  TempSrc:  Oral Oral Oral  Resp: 18 16 16 20   Height:      Weight:      SpO2: 98% 99% 99% 96%    Wt Readings from Last 3 Encounters:  01/08/15 100.4 kg (221 lb 5.5 oz)  12/25/14 100.835 kg (222 lb 4.8 oz)  11/27/14 101.696 kg (224 lb 3.2 oz)     Intake/Output Summary (Last 24 hours) at 01/10/15 1332 Last data filed at 01/10/15 1033  Gross per 24 hour  Intake     10 ml  Output    705 ml  Net   -695 ml    Exam  General: Well developed, well nourished, NAD, appears stated age  105: NCAT,mucous membranes moist.   Cardiovascular: S1 S2 auscultated, no rubs, murmurs or gallops. Regular rate and rhythm.  Respiratory: Clear to auscultation bilaterally  with equal chest rise  Abdomen: Soft, LLQ TTP, nondistended, + bowel sounds  Extremities: warm dry without cyanosis clubbing or edema  Neuro: AAOx3, nonfocal  Psych: Normal affect and demeanor with intact judgement and insight  Data Review   Micro Results Recent Results (from the past 240 hour(s))  Culture, routine-abscess     Status: None (Preliminary result)   Collection Time: 01/09/15 12:24 PM  Result Value Ref Range Status   Specimen Description DRAINAGE  Final   Special Requests NONE  Final   Gram Stain   Final    ABUNDANT WBC PRESENT,BOTH PMN AND MONONUCLEAR NO SQUAMOUS EPITHELIAL CELLS SEEN ABUNDANT GRAM POSITIVE RODS MODERATE GRAM POSITIVE COCCI IN PAIRS    Culture PENDING  Incomplete   Report Status PENDING  Incomplete    Radiology Reports Ct Abdomen Pelvis W Contrast  01/08/2015  CLINICAL DATA:  Follow up diverticulitis. History of multiple myeloma. EXAM: CT ABDOMEN AND PELVIS WITH CONTRAST TECHNIQUE: Multidetector CT imaging of the abdomen and pelvis was performed using the standard protocol following bolus administration of intravenous contrast. CONTRAST:  152m OMNIPAQUE IOHEXOL 300 MG/ML  SOLN COMPARISON:  PET-CT 08/11/2014 and 12/22/2014. FINDINGS: Lower chest: There are no significant residual findings at the visualized lung bases. There is mild linear right lower lobe scarring. Aortic and coronary artery atherosclerosis noted. Hepatobiliary: The liver is normal in density without focal abnormality. No significant biliary dilatation status post cholecystectomy. Pancreas: Unremarkable. No pancreatic ductal dilatation or surrounding inflammatory changes. Spleen: Normal in size without focal abnormality. Adrenals/Urinary Tract: Both adrenal glands appear normal. Grossly stable left renal cortical cysts and parapelvic cysts bilaterally. No hydronephrosis or urinary tract calculus. The bladder appears unremarkable. Stomach/Bowel: The stomach, small bowel, proximal colon and  appendix demonstrate no significant findings. Diverticular changes are again noted throughout the distal colon. There are persistent inflammatory changes surrounding the sigmoid colon with small extraluminal air bubbles and surrounding inflammatory change. There is a fluid collection beneath the anterior abdominal wall which contains air bubbles and measures up to 6.3 x 2.4 cm on image 74. This has mildly enlarged compared with the prior study. There is no free intraperitoneal air or evidence of bowel obstruction. Vascular/Lymphatic: There are no enlarged abdominal or pelvic lymph nodes. Fairly extensive atherosclerosis of the aorta, its branches and the iliac arteries. No evidence of large vessel occlusion. Reproductive: Stable moderate enlargement  of the prostate gland without apparent focal abnormality. Other: Stable small umbilical hernia containing only fat. Musculoskeletal: The lytic lesions in the left aspect of the L3 vertebral body and the right aspect of the sacrum are unchanged. The latter measures 2.2 cm on image 67. L4 superior endplate compression deformity appears unchanged. No new lytic lesions or pathologic fracture. IMPRESSION: 1. Persistent changes of sigmoid diverticulitis without significant improvement over the last 17 days. There is a persistent peridiverticular abscess. No evidence of obstruction. 2. No acute osseous findings or evidence of progressive multiple myeloma. Lytic L3 and right sacral lesions are unchanged. 3. Stable incidental findings including diffuse atherosclerosis, renal parapelvic cysts and prostatomegaly. 4. These results were called by telephone at the time of interpretation on 01/08/2015 at 2:45 pm to Dr. Ancil Linsey , who verbally acknowledged these results. Electronically Signed   By: Richardean Sale M.D.   On: 01/08/2015 14:50   Nm Pet Image Restage (ps) Whole Body  12/22/2014  CLINICAL DATA:  Subsequent treatment strategy for multiple myeloma. EXAM: NUCLEAR  MEDICINE PET WHOLE BODY TECHNIQUE: 11.9 mCi F-18 FDG was injected intravenously. Full-ring PET imaging was performed from the vertex to the feet after the radiotracer. CT data was obtained and used for attenuation correction and anatomic localization. FASTING BLOOD GLUCOSE:  Value:  86 mg/dl COMPARISON:  PET-CT 08/11/2014 and 03/16/2014. FINDINGS: NECK No hypermetabolic cervical lymph nodes are identified.There are no lesions of the pharyngeal mucosal space. There is stable mild symmetric prominence of activity within the submandibular glands bilaterally. CHEST There are no hypermetabolic mediastinal, hilar or axillary lymph nodes. Interval significant improvement in the inflammatory changes in the right upper lobe with associated hypermetabolic activity. There are some residual linear components without hypermetabolic activity. Within the right lower lobe, there is low-level hypermetabolic activity associated with subpleural ground-glass density. This has an SUV max of 5.8 and appears inflammatory. No suspicious pulmonary findings. ABDOMEN/PELVIS There is no hypermetabolic activity within the liver, adrenal glands, spleen or pancreas. There is no hypermetabolic nodal activity. There is extensive sigmoid diverticulosis with increased wall thickening and associated hypermetabolic activity. There is a new small lenticular shaped fluid collection with air bubbles beneath the left anterior abdominal wall, measuring 5.9 x 1.7 cm on image 195. This demonstrates hyper metabolism and is suspicious for a small abscess. No free intraperitoneal air or evidence of bowel obstruction. Moderate enlargement of the prostate gland, diffuse atherosclerosis and bilateral renal cortical and parapelvic cysts are again noted. SKELETON There is decreased metabolic activity throughout the lumbar spine attributed to radiation therapy. There is mild mottled activity throughout the skeleton without focally increased activity to suggest  residual active multiple myeloma. The right sacral lesion measures 2.3 cm on image 192 and has no residual hypermetabolic activity (SUV max 2.5). The L3 lesion appears unchanged in size without residual hypermetabolic activity. Right rib deformities are noted without abnormal metabolic activity. EXTREMITIES No hypermetabolic activity to suggest metastasis. IMPRESSION: 1. No evidence of residual metabolically active multiple myeloma in the axial or appendicular skeleton. The dominant lesions in the L3 vertebral body and right sacrum have not changed in size. 2. No evidence of extra osseous neoplasm. 3. Interval improvement in inflammatory changes in the right lung, possibly related to previous radiation therapy. 4. New hypermetabolism in the sigmoid colon associated with changes of probable diverticulitis and a small peridiverticular abscess. Bowel rest and antibiotic therapy recommended. 5. These results will be called to the ordering clinician or representative by the Radiologist Assistant, and  communication documented in the PACS or zVision Dashboard. Electronically Signed   By: Richardean Sale M.D.   On: 12/22/2014 11:11   Ct Image Guided Drainage By Percutaneous Catheter  01/09/2015  CLINICAL DATA:  Diverticular abscess EXAM: CT GUIDED DRAINAGE OF LEFT LOWER QUADRANT PERITONEAL ABSCESS ANESTHESIA/SEDATION: Intravenous Fentanyl and Versed were administered as conscious sedation during continuous cardiorespiratory monitoring by the radiology RN, with a total moderate sedation time of 11 minutes. PROCEDURE: The procedure, risks, benefits, and alternatives were explained to the patient. Questions regarding the procedure were encouraged and answered. The patient understands and consents to the procedure. patient placed supine. select axial scans through the lower abdomen were obtained. the left lower quadrant extraluminal gas and fluid collection was localized and an appropriate skin entry site was determined and  marked. The operative field was prepped with Betadinein a sterile fashion, and a sterile drape was applied covering the operative field. A sterile gown and sterile gloves were used for the procedure. Local anesthesia was provided with 1% Lidocaine. Under CT fluoroscopic guidance, a 19 gauge percutaneous entry needle was advanced into the superficial left lower quadrant peritoneal collection. Purulent material could be aspirated. An Amplatz guidewire advanced easily within the collection, its position confirmed on CT fluoroscopy. The tract was dilated to facilitate placement of a 12 French pigtail catheter, formed centrally within the collection. 5 mL of purulent aspirate were removed and sent for Gram stain, culture and sensitivity. The catheter was secured externally with 0 Prolene suture and StatLock and placed to gravity drain bag. The patient tolerated the procedure well. COMPLICATIONS: None immediate FINDINGS: The left lower quadrant superficial complex peritoneal collection measured approximately 6.1 cm maximum transverse diameter was again localized. Percutaneous drain catheter placed as above. IMPRESSION: 1. Technically successful CT-guided percutaneous peritoneal abscess drain catheter placement. Sample of the aspirate sent for Gram stain, culture and sensitivity. Electronically Signed   By: Lucrezia Europe M.D.   On: 01/09/2015 15:11    CBC  Recent Labs Lab 01/08/15 1707 01/09/15 0425 01/10/15 0410  WBC 7.3 5.8 4.0  HGB 14.0 12.3* 11.6*  HCT 41.5 37.1* 35.4*  PLT 91* 72* 82*  MCV 92.6 94.4 93.7  MCH 31.3 31.3 30.7  MCHC 33.7 33.2 32.8  RDW 16.5* 16.4* 16.2*  LYMPHSABS 0.8 0.6*  --   MONOABS 1.0 1.0  --   EOSABS 0.0 0.1  --   BASOSABS 0.0 0.0  --     Chemistries   Recent Labs Lab 01/08/15 1707 01/09/15 0425 01/10/15 0410  NA 136 136 137  K 4.1 3.7 3.6  CL 102 106 110  CO2 25 26 22   GLUCOSE 95 115* 101*  BUN 17 15 11   CREATININE 0.81 0.66 0.68  CALCIUM 8.2* 7.6* 7.4*  AST   --  11*  --   ALT  --  11*  --   ALKPHOS  --  17*  --   BILITOT  --  0.9  --    ------------------------------------------------------------------------------------------------------------------ estimated creatinine clearance is 88.8 mL/min (by C-G formula based on Cr of 0.68). ------------------------------------------------------------------------------------------------------------------ No results for input(s): HGBA1C in the last 72 hours. ------------------------------------------------------------------------------------------------------------------ No results for input(s): CHOL, HDL, LDLCALC, TRIG, CHOLHDL, LDLDIRECT in the last 72 hours. ------------------------------------------------------------------------------------------------------------------ No results for input(s): TSH, T4TOTAL, T3FREE, THYROIDAB in the last 72 hours.  Invalid input(s): FREET3 ------------------------------------------------------------------------------------------------------------------ No results for input(s): VITAMINB12, FOLATE, FERRITIN, TIBC, IRON, RETICCTPCT in the last 72 hours.  Coagulation profile  Recent Labs Lab 01/09/15 1000  INR 1.28  No results for input(s): DDIMER in the last 72 hours.  Cardiac Enzymes No results for input(s): CKMB, TROPONINI, MYOGLOBIN in the last 168 hours.  Invalid input(s): CK ------------------------------------------------------------------------------------------------------------------ Invalid input(s): POCBNP    Verneita Griffes, MD [facility] [phone]

## 2015-01-10 NOTE — Progress Notes (Signed)
Subjective: S/p perc LLQ abscess drain for diverticular abscess Feeling some better  Allergies: Codeine and Morphine and related  Medications:  Current facility-administered medications:  .  acyclovir (ZOVIRAX) tablet 400 mg, 400 mg, Oral, BID, Gennaro Africa, MD, 400 mg at 01/09/15 2056 .  ALPRAZolam Duanne Moron) tablet 1-2 mg, 1-2 mg, Oral, QHS PRN, Gennaro Africa, MD, 1 mg at 01/09/15 2333 .  [START ON 01/12/2015] dexamethasone (DECADRON) tablet 20 mg, 20 mg, Oral, Once per day on Tue Fri, Maryann Mikhail, DO .  dextrose 5 %-0.9 % sodium chloride infusion, , Intravenous, Continuous, Gennaro Africa, MD, Last Rate: 100 mL/hr at 01/09/15 [id number] .  HYDROcodone-acetaminophen (NORCO/VICODIN) 5-325 MG per tablet 1-2 tablet, 1-2 tablet, Oral, Q4H PRN, Arne Cleveland, MD .  pantoprazole (PROTONIX) injection 40 mg, 40 mg, Intravenous, Q24H, Gennaro Africa, MD, 40 mg at 01/09/15 1838 .  piperacillin-tazobactam (ZOSYN) IVPB 3.375 g, 3.375 g, Intravenous, 3 times per day, Jackolyn Confer, MD, Last Rate: 12.5 mL/hr at 01/10/15 0205, 3.375 g at 01/10/15 0205  Facility-Administered Medications Ordered in Other Encounters:  .  sodium chloride 0.9 % injection 10 mL, 10 mL, Intracatheter, PRN, Baird Cancer, PA-C, 10 mL at 08/04/14 1103   Vital Signs: BP 108/63 mmHg  Pulse 74  Temp(Src) 98 F (36.7 C) (Oral)  Resp 20  Ht 5\' 8"  (1.727 m)  Wt 221 lb 5.5 oz (100.4 kg)  BMI 33.66 kg/m2  SpO2 96%  Physical Exam  Abdominal:  LLQ drain intact, site clean, NT Output blood tinged purulent    Imaging: Ct Image Guided Drainage By Percutaneous Catheter  01/09/2015  CLINICAL DATA:  Diverticular abscess EXAM: CT GUIDED DRAINAGE OF LEFT LOWER QUADRANT PERITONEAL ABSCESS ANESTHESIA/SEDATION: Intravenous Fentanyl and Versed were administered as conscious sedation during continuous cardiorespiratory monitoring by the radiology RN, with a total moderate sedation time of 11 minutes. PROCEDURE: The procedure, risks,  benefits, and alternatives were explained to the patient. Questions regarding the procedure were encouraged and answered. The patient understands and consents to the procedure. patient placed supine. select axial scans through the lower abdomen were obtained. the left lower quadrant extraluminal gas and fluid collection was localized and an appropriate skin entry site was determined and marked. The operative field was prepped with Betadinein a sterile fashion, and a sterile drape was applied covering the operative field. A sterile gown and sterile gloves were used for the procedure. Local anesthesia was provided with 1% Lidocaine. Under CT fluoroscopic guidance, a 19 gauge percutaneous entry needle was advanced into the superficial left lower quadrant peritoneal collection. Purulent material could be aspirated. An Amplatz guidewire advanced easily within the collection, its position confirmed on CT fluoroscopy. The tract was dilated to facilitate placement of a 12 French pigtail catheter, formed centrally within the collection. 5 mL of purulent aspirate were removed and sent for Gram stain, culture and sensitivity. The catheter was secured externally with 0 Prolene suture and StatLock and placed to gravity drain bag. The patient tolerated the procedure well. COMPLICATIONS: None immediate FINDINGS: The left lower quadrant superficial complex peritoneal collection measured approximately 6.1 cm maximum transverse diameter was again localized. Percutaneous drain catheter placed as above. IMPRESSION: 1. Technically successful CT-guided percutaneous peritoneal abscess drain catheter placement. Sample of the aspirate sent for Gram stain, culture and sensitivity. Electronically Signed   By: Lucrezia Europe M.D.   On: 01/09/2015 15:11    Labs:  CBC:  Recent Labs  11/27/14 0915 01/08/15 1707 01/09/15 0425 01/10/15 0410  WBC 6.6 7.3  5.8 4.0  HGB 13.9 14.0 12.3* 11.6*  HCT 41.1 41.5 37.1* 35.4*  PLT 105* 91* 72* 82*      COAGS:  Recent Labs  02/20/14 0725 01/09/15 1000  INR 1.06 1.28  APTT 29  --     BMP:  Recent Labs  12/25/14 0933 01/08/15 1707 01/09/15 0425 01/10/15 0410  NA 142 136 136 137  K 4.3 4.1 3.7 3.6  CL 109 102 106 110  CO2 26 25 26 22   GLUCOSE 83 95 115* 101*  BUN 22* 17 15 11   CALCIUM 8.4* 8.2* 7.6* 7.4*  CREATININE 0.77 0.81 0.66 0.68  GFRNONAA >60 >60 >60 >60  GFRAA >60 >60 >60 >60    LIVER FUNCTION TESTS:  Recent Labs  10/27/14 0839 11/27/14 0915 12/25/14 1020 01/09/15 0425  BILITOT 0.6 0.7 0.5 0.9  AST 23 22 22  11*  ALT 16* 18 21 11*  ALKPHOS 25* 28* 27* 17*  PROT 5.5* 5.6* 5.7* 4.6*  ALBUMIN 3.1* 2.9* 2.9* 2.1*    Assessment and Plan: Diverticular abscess s/p perc LLQ drain 12/6 Purulent output, Cx pending Other plans per [facility]  Signed: Ascencion Dike 01/10/2015, 9:44 AM   I spent a total of 15 Minutes at the the patient's bedside AND on the patient's hospital floor or unit, greater than 50% of which was counseling/coordinating care for drainage of diverticular abscess

## 2015-01-11 LAB — CBC
HCT: 36.2 % — ABNORMAL LOW (ref 39.0–52.0)
Hemoglobin: 12.1 g/dL — ABNORMAL LOW (ref 13.0–17.0)
MCH: 30.7 pg (ref 26.0–34.0)
MCHC: 33.4 g/dL (ref 30.0–36.0)
MCV: 91.9 fL (ref 78.0–100.0)
Platelets: 95 10*3/uL — ABNORMAL LOW (ref 150–400)
RBC: 3.94 MIL/uL — ABNORMAL LOW (ref 4.22–5.81)
RDW: 15.8 % — ABNORMAL HIGH (ref 11.5–15.5)
WBC: 4.8 10*3/uL (ref 4.0–10.5)

## 2015-01-11 NOTE — Progress Notes (Signed)
Pharmacy Antibiotic Time-Out Note  Dean [name] is a 79 y.o. year-old male admitted on 01/08/2015.  The patient is currently on day #4 Zosyn 3.375g IV q8h (4 hour infusion time) for acute sigmoid diverticulitis with enlarging diverticular abscess.  Assessment/Plan: This patient's current antibiotics will be continued without adjustments.  Temp (24hrs), Avg:97.8 F (36.6 C), Min:97.3 F (36.3 C), Max:98.3 F (36.8 C)   Recent Labs Lab 01/08/15 1707 01/09/15 0425 01/10/15 0410 01/11/15 0353  WBC 7.3 5.8 4.0 4.8    Recent Labs Lab 01/08/15 1707 01/09/15 0425 01/10/15 0410  CREATININE 0.81 0.66 0.68   Estimated Creatinine Clearance: 88.8 mL/min (by C-G formula based on Cr of 0.68).   Antimicrobial allergies: none  Antimicrobials this admission: 12/5 >> Zosyn >>  Levels/dose changes this admission: -  Microbiology Results: 12/6 abscess drainage cx: multiple organisms presents, none predominant  Thank you for allowing pharmacy to be a part of this patient's care.  Hershal Coria PharmD 01/11/2015 10:29 AM

## 2015-01-11 NOTE — Progress Notes (Signed)
Subjective: Pain is much better.  He is doing OK with clears.  His drainage is still bloody but less purulent. He is up and walking allot also.  Objective: Vital signs in last 24 hours: Temp:  [97.3 F (36.3 C)-98.3 F (36.8 C)] 97.9 F (36.6 C) (12/08 0530) Pulse Rate:  [61-63] 61 (12/08 0530) Resp:  [16-20] 20 (12/08 0530) BP: (123-127)/(56-62) 127/59 mmHg (12/08 0530) SpO2:  [96 %-97 %] 96 % (12/08 0530) Last BM Date: 01/08/15 240 PO recorded Diet: cleras Drain 30 ml recorded WBC is normal   Intake/Output from previous day: 12/07 0701 - 12/08 0700 In: 5450 [P.O.:240; I.V.:4800; IV Piggyback:400] Out: 8185 [Urine:1375; Drains:30] Intake/Output this shift: Total I/O In: -  Out: 325 [Urine:325]  General appearance: alert, cooperative and no distress GI: soft, nontender, drain is still putting our old bloody drainage, but it is less purulent today.  Lab Results:   Recent Labs  01/10/15 0410 01/11/15 0353  WBC 4.0 4.8  HGB 11.6* 12.1*  HCT 35.4* 36.2*  PLT 82* 95*    BMET  Recent Labs  01/09/15 0425 01/10/15 0410  NA 136 137  K 3.7 3.6  CL 106 110  CO2 26 22  GLUCOSE 115* 101*  BUN 15 11  CREATININE 0.66 0.68  CALCIUM 7.6* 7.4*   PT/INR  Recent Labs  01/09/15 1000  LABPROT 16.1*  INR 1.28     Recent Labs Lab 01/09/15 0425  AST 11*  ALT 11*  ALKPHOS 17*  BILITOT 0.9  PROT 4.6*  ALBUMIN 2.1*     Lipase  No results found for: LIPASE   Studies/Results: Ct Image Guided Drainage By Percutaneous Catheter  01/09/2015  CLINICAL DATA:  Diverticular abscess EXAM: CT GUIDED DRAINAGE OF LEFT LOWER QUADRANT PERITONEAL ABSCESS ANESTHESIA/SEDATION: Intravenous Fentanyl and Versed were administered as conscious sedation during continuous cardiorespiratory monitoring by the radiology RN, with a total moderate sedation time of 11 minutes. PROCEDURE: The procedure, risks, benefits, and alternatives were explained to the patient. Questions regarding  the procedure were encouraged and answered. The patient understands and consents to the procedure. patient placed supine. select axial scans through the lower abdomen were obtained. the left lower quadrant extraluminal gas and fluid collection was localized and an appropriate skin entry site was determined and marked. The operative field was prepped with Betadinein a sterile fashion, and a sterile drape was applied covering the operative field. A sterile gown and sterile gloves were used for the procedure. Local anesthesia was provided with 1% Lidocaine. Under CT fluoroscopic guidance, a 19 gauge percutaneous entry needle was advanced into the superficial left lower quadrant peritoneal collection. Purulent material could be aspirated. An Amplatz guidewire advanced easily within the collection, its position confirmed on CT fluoroscopy. The tract was dilated to facilitate placement of a 12 French pigtail catheter, formed centrally within the collection. 5 mL of purulent aspirate were removed and sent for Gram stain, culture and sensitivity. The catheter was secured externally with 0 Prolene suture and StatLock and placed to gravity drain bag. The patient tolerated the procedure well. COMPLICATIONS: None immediate FINDINGS: The left lower quadrant superficial complex peritoneal collection measured approximately 6.1 cm maximum transverse diameter was again localized. Percutaneous drain catheter placed as above. IMPRESSION: 1. Technically successful CT-guided percutaneous peritoneal abscess drain catheter placement. Sample of the aspirate sent for Gram stain, culture and sensitivity. Electronically Signed   By: Lucrezia Europe M.D.   On: 01/09/2015 15:11    Medications: . acyclovir  400 mg  Oral BID  . [START ON 01/12/2015] dexamethasone  20 mg Oral Once per day on Tue Fri  . pantoprazole (PROTONIX) IV  40 mg Intravenous Q24H  . piperacillin-tazobactam (ZOSYN)  IV  3.375 g Intravenous 3 times per day  . saccharomyces  boulardii  250 mg Oral BID    Assessment/Plan Acute sigmoid diverticulitis with enlarging 6.3 x 2.4 cm diverticular abscess IR drain placement 01/09/15  Multiple Myeloma on Zometa, Dexamethasone, and lenalidomide. Hx of radiation therapy to right chest, spine and left occipital condyle Neuropathy Hx o f ETOH abuse Thrombocytopenia  Antibiotics: Zosyn/acyclovir day 4 DVT: Adding SCD's/currently platelets low, defer to Medicine.    Plan:  Continue antibiotics and clears.       LOS: 3 days    Esten Dollar 01/11/2015

## 2015-01-11 NOTE — Care Management Important Message (Signed)
Important Message  Patient Details  Name: Zhion Truitt MRN: [id number] Date of Birth: Jun 07, 1935   Medicare Important Message Given:  Yes    Camillo Flaming 01/11/2015, 11:49 AMImportant Message  Patient Details  Name: Jaaziah Padgett MRN: [id number] Date of Birth: 11-07-1935   Medicare Important Message Given:  Yes    Camillo Flaming 01/11/2015, 11:45 AM

## 2015-01-11 NOTE — Progress Notes (Signed)
[facility] Complaint  Patient presents with  . Abdominal Abscess       HPI on 01/08/2015 by Dr. Gennaro Africa Patient with history of MM diagnosed in Jan 2016 on Revlimid every other day who was found to have peridiverticular abscess/diverticulitis incidentally in a PET scan done 2 weeks ago with failing of outpatient management for the past two weeks. He has been taken Flagyl and Cipro for the past two weeks. Repeat CT scan today shows increase in abscess size and persistence over diverticulitis. Patient has no complaints except for fatigue/weakness that started back in January and has been progressing. He had fever/chills for the past two days but denied any abdominal pain, N/V, diarrhea, blood in stool. No other complaints.   Assessment & Plan   Acute sigmoid diverticulitis with enlarging diverticular abscess -Noted on PET scan done approximately 2 weeks ago. Patient failed outpatient treatment with ciprofloxacin and Flagyl -CT abdomen = sigmoid diverticulitis without significant improvement over the last 17 days, persistent peridiverticular abscess -Continue IV Zosyn -Gen. surgery consulted and appreciated. IR performed perc drain in  LLQ Ct guided on 12/6 -Continue pain control and antiemetics as needed -monitor for resolution-might consider drain study probably in the next 2-3 days-IR is aware [thanks]  Multiple myeloma -Revlimid held.  -Patient follows with Dr. Whitney Muse, oncologist  Thrombocytopenia -Likely secondary to chemo -will continue to monitor CBC  Code Status: Full  Family Communication: None at bedside  Disposition Plan: Admitted.    Time Spent in minutes   30 minutes  Procedures  None  Consults   General surgery Interventional radiology  DVT Prophylaxis  SCDs  Lab Results  Component Value Date   PLT 95* 01/11/2015    Medications  Scheduled Meds: .  acyclovir  400 mg Oral BID  . [START ON 01/12/2015] dexamethasone  20 mg Oral Once per day on Tue Fri  . pantoprazole (PROTONIX) IV  40 mg Intravenous Q24H  . piperacillin-tazobactam (ZOSYN)  IV  3.375 g Intravenous 3 times per day  . saccharomyces boulardii  250 mg Oral BID   Continuous Infusions: . dextrose 5 % and 0.9% NaCl 100 mL/hr at 01/10/15 2129   PRN Meds:.ALPRAZolam, HYDROcodone-acetaminophen  Antibiotics    Anti-infectives    Start     Dose/Rate Route Frequency Ordered Stop   01/08/15 2200  piperacillin-tazobactam (ZOSYN) IVPB 3.375 g     3.375 g 12.5 mL/hr over 240 Minutes Intravenous 3 times per day 01/08/15 1804     01/08/15 2200  acyclovir (ZOVIRAX) tablet 400 mg     400 mg Oral 2 times daily 01/08/15 1925     01/08/15 1745  metroNIDAZOLE (FLAGYL) IVPB 1 g  Status:  Discontinued     1 g 200 mL/hr over 60 Minutes Intravenous  Once 01/08/15 1733 01/08/15 1804   01/08/15 1745  cefTRIAXone (ROCEPHIN) 1 g in dextrose 5 % 50 mL IVPB  Status:  Discontinued     1 g 100 mL/hr over 30 Minutes Intravenous  Once 01/08/15 1733 01/08/15 1804  Subjective:   Wel Adb pain much imporved No n/v/cp.  Objective:   Filed Vitals:   01/10/15 0648 01/10/15 1506 01/11/15 0135 01/11/15 0530  BP: 108/63 123/62 123/56 127/59  Pulse: 74 63 63 61  Temp: 98 F (36.7 C) 98.3 F (36.8 C) 97.3 F (36.3 C) 97.9 F (36.6 C)  TempSrc: Oral Oral Oral Oral  Resp: 20 20 16 20   Height:      Weight:      SpO2: 96% 97% 96% 96%    Wt Readings from Last 3 Encounters:  01/08/15 100.4 kg (221 lb 5.5 oz)  12/25/14 100.835 kg (222 lb 4.8 oz)  11/27/14 101.696 kg (224 lb 3.2 oz)     Intake/Output Summary (Last 24 hours) at 01/11/15 1520 Last data filed at 01/11/15 7416  Gross per 24 hour  Intake   5445 ml  Output   1300 ml  Net   4145 ml    Exam  General: Well developed, well nourished, NAD, appears stated age  7: NCAT,mucous membranes moist.   Cardiovascular: S1 S2  auscultated, no rubs, murmurs or gallops. Regular rate and rhythm.  Respiratory: Clear to auscultation bilaterally with equal chest rise  Abdomen: Soft, LLQ TTP, nondistended, + bowel sounds  Extremities: warm dry without cyanosis clubbing or edema  Neuro: AAOx3, nonfocal  Psych: Normal affect and demeanor with intact judgement and insight  Data Review   Micro Results Recent Results (from the past 240 hour(s))  Culture, routine-abscess     Status: None (Preliminary result)   Collection Time: 01/09/15 12:24 PM  Result Value Ref Range Status   Specimen Description DRAINAGE  Final   Special Requests NONE  Final   Gram Stain   Final    ABUNDANT WBC PRESENT,BOTH PMN AND MONONUCLEAR NO SQUAMOUS EPITHELIAL CELLS SEEN ABUNDANT GRAM POSITIVE RODS MODERATE GRAM POSITIVE COCCI IN PAIRS    Culture   Final    MULTIPLE ORGANISMS PRESENT, NONE PREDOMINANT Performed at [facility]    Report Status PENDING  Incomplete    Radiology Reports Ct Abdomen Pelvis W Contrast  01/08/2015  CLINICAL DATA:  Follow up diverticulitis. History of multiple myeloma. EXAM: CT ABDOMEN AND PELVIS WITH CONTRAST TECHNIQUE: Multidetector CT imaging of the abdomen and pelvis was performed using the standard protocol following bolus administration of intravenous contrast. CONTRAST:  125m OMNIPAQUE IOHEXOL 300 MG/ML  SOLN COMPARISON:  PET-CT 08/11/2014 and 12/22/2014. FINDINGS: Lower chest: There are no significant residual findings at the visualized lung bases. There is mild linear right lower lobe scarring. Aortic and coronary artery atherosclerosis noted. Hepatobiliary: The liver is normal in density without focal abnormality. No significant biliary dilatation status post cholecystectomy. Pancreas: Unremarkable. No pancreatic ductal dilatation or surrounding inflammatory changes. Spleen: Normal in size without focal abnormality. Adrenals/Urinary Tract: Both adrenal glands appear normal. Grossly stable left  renal cortical cysts and parapelvic cysts bilaterally. No hydronephrosis or urinary tract calculus. The bladder appears unremarkable. Stomach/Bowel: The stomach, small bowel, proximal colon and appendix demonstrate no significant findings. Diverticular changes are again noted throughout the distal colon. There are persistent inflammatory changes surrounding the sigmoid colon with small extraluminal air bubbles and surrounding inflammatory change. There is a fluid collection beneath the anterior abdominal wall which contains air bubbles and measures up to 6.3 x 2.4 cm on image 74. This has mildly enlarged compared with the prior study. There is no free intraperitoneal air or evidence of bowel obstruction. Vascular/Lymphatic: There are no enlarged abdominal or pelvic lymph nodes.  Fairly extensive atherosclerosis of the aorta, its branches and the iliac arteries. No evidence of large vessel occlusion. Reproductive: Stable moderate enlargement of the prostate gland without apparent focal abnormality. Other: Stable small umbilical hernia containing only fat. Musculoskeletal: The lytic lesions in the left aspect of the L3 vertebral body and the right aspect of the sacrum are unchanged. The latter measures 2.2 cm on image 67. L4 superior endplate compression deformity appears unchanged. No new lytic lesions or pathologic fracture. IMPRESSION: 1. Persistent changes of sigmoid diverticulitis without significant improvement over the last 17 days. There is a persistent peridiverticular abscess. No evidence of obstruction. 2. No acute osseous findings or evidence of progressive multiple myeloma. Lytic L3 and right sacral lesions are unchanged. 3. Stable incidental findings including diffuse atherosclerosis, renal parapelvic cysts and prostatomegaly. 4. These results were called by telephone at the time of interpretation on 01/08/2015 at 2:45 pm to Dr. Ancil Linsey , who verbally acknowledged these results. Electronically  Signed   By: Richardean Sale M.D.   On: 01/08/2015 14:50   Nm Pet Image Restage (ps) Whole Body  12/22/2014  CLINICAL DATA:  Subsequent treatment strategy for multiple myeloma. EXAM: NUCLEAR MEDICINE PET WHOLE BODY TECHNIQUE: 11.9 mCi F-18 FDG was injected intravenously. Full-ring PET imaging was performed from the vertex to the feet after the radiotracer. CT data was obtained and used for attenuation correction and anatomic localization. FASTING BLOOD GLUCOSE:  Value:  86 mg/dl COMPARISON:  PET-CT 08/11/2014 and 03/16/2014. FINDINGS: NECK No hypermetabolic cervical lymph nodes are identified.There are no lesions of the pharyngeal mucosal space. There is stable mild symmetric prominence of activity within the submandibular glands bilaterally. CHEST There are no hypermetabolic mediastinal, hilar or axillary lymph nodes. Interval significant improvement in the inflammatory changes in the right upper lobe with associated hypermetabolic activity. There are some residual linear components without hypermetabolic activity. Within the right lower lobe, there is low-level hypermetabolic activity associated with subpleural ground-glass density. This has an SUV max of 5.8 and appears inflammatory. No suspicious pulmonary findings. ABDOMEN/PELVIS There is no hypermetabolic activity within the liver, adrenal glands, spleen or pancreas. There is no hypermetabolic nodal activity. There is extensive sigmoid diverticulosis with increased wall thickening and associated hypermetabolic activity. There is a new small lenticular shaped fluid collection with air bubbles beneath the left anterior abdominal wall, measuring 5.9 x 1.7 cm on image 195. This demonstrates hyper metabolism and is suspicious for a small abscess. No free intraperitoneal air or evidence of bowel obstruction. Moderate enlargement of the prostate gland, diffuse atherosclerosis and bilateral renal cortical and parapelvic cysts are again noted. SKELETON There is  decreased metabolic activity throughout the lumbar spine attributed to radiation therapy. There is mild mottled activity throughout the skeleton without focally increased activity to suggest residual active multiple myeloma. The right sacral lesion measures 2.3 cm on image 192 and has no residual hypermetabolic activity (SUV max 2.5). The L3 lesion appears unchanged in size without residual hypermetabolic activity. Right rib deformities are noted without abnormal metabolic activity. EXTREMITIES No hypermetabolic activity to suggest metastasis. IMPRESSION: 1. No evidence of residual metabolically active multiple myeloma in the axial or appendicular skeleton. The dominant lesions in the L3 vertebral body and right sacrum have not changed in size. 2. No evidence of extra osseous neoplasm. 3. Interval improvement in inflammatory changes in the right lung, possibly related to previous radiation therapy. 4. New hypermetabolism in the sigmoid colon associated with changes of probable diverticulitis and a small peridiverticular abscess. Bowel  rest and antibiotic therapy recommended. 5. These results will be called to the ordering clinician or representative by the Radiologist Assistant, and communication documented in the PACS or zVision Dashboard. Electronically Signed   By: Richardean Sale M.D.   On: 12/22/2014 11:11   Ct Image Guided Drainage By Percutaneous Catheter  01/09/2015  CLINICAL DATA:  Diverticular abscess EXAM: CT GUIDED DRAINAGE OF LEFT LOWER QUADRANT PERITONEAL ABSCESS ANESTHESIA/SEDATION: Intravenous Fentanyl and Versed were administered as conscious sedation during continuous cardiorespiratory monitoring by the radiology RN, with a total moderate sedation time of 11 minutes. PROCEDURE: The procedure, risks, benefits, and alternatives were explained to the patient. Questions regarding the procedure were encouraged and answered. The patient understands and consents to the procedure. patient placed supine.  select axial scans through the lower abdomen were obtained. the left lower quadrant extraluminal gas and fluid collection was localized and an appropriate skin entry site was determined and marked. The operative field was prepped with Betadinein a sterile fashion, and a sterile drape was applied covering the operative field. A sterile gown and sterile gloves were used for the procedure. Local anesthesia was provided with 1% Lidocaine. Under CT fluoroscopic guidance, a 19 gauge percutaneous entry needle was advanced into the superficial left lower quadrant peritoneal collection. Purulent material could be aspirated. An Amplatz guidewire advanced easily within the collection, its position confirmed on CT fluoroscopy. The tract was dilated to facilitate placement of a 12 French pigtail catheter, formed centrally within the collection. 5 mL of purulent aspirate were removed and sent for Gram stain, culture and sensitivity. The catheter was secured externally with 0 Prolene suture and StatLock and placed to gravity drain bag. The patient tolerated the procedure well. COMPLICATIONS: None immediate FINDINGS: The left lower quadrant superficial complex peritoneal collection measured approximately 6.1 cm maximum transverse diameter was again localized. Percutaneous drain catheter placed as above. IMPRESSION: 1. Technically successful CT-guided percutaneous peritoneal abscess drain catheter placement. Sample of the aspirate sent for Gram stain, culture and sensitivity. Electronically Signed   By: Lucrezia Europe M.D.   On: 01/09/2015 15:11    CBC  Recent Labs Lab 01/08/15 1707 01/09/15 0425 01/10/15 0410 01/11/15 0353  WBC 7.3 5.8 4.0 4.8  HGB 14.0 12.3* 11.6* 12.1*  HCT 41.5 37.1* 35.4* 36.2*  PLT 91* 72* 82* 95*  MCV 92.6 94.4 93.7 91.9  MCH 31.3 31.3 30.7 30.7  MCHC 33.7 33.2 32.8 33.4  RDW 16.5* 16.4* 16.2* 15.8*  LYMPHSABS 0.8 0.6*  --   --   MONOABS 1.0 1.0  --   --   EOSABS 0.0 0.1  --   --   BASOSABS  0.0 0.0  --   --     Chemistries   Recent Labs Lab 01/08/15 1707 01/09/15 0425 01/10/15 0410  NA 136 136 137  K 4.1 3.7 3.6  CL 102 106 110  CO2 25 26 22   GLUCOSE 95 115* 101*  BUN 17 15 11   CREATININE 0.81 0.66 0.68  CALCIUM 8.2* 7.6* 7.4*  AST  --  11*  --   ALT  --  11*  --   ALKPHOS  --  17*  --   BILITOT  --  0.9  --    ------------------------------------------------------------------------------------------------------------------ estimated creatinine clearance is 88.8 mL/min (by C-G formula based on Cr of 0.68). ------------------------------------------------------------------------------------------------------------------ No results for input(s): HGBA1C in the last 72 hours. ------------------------------------------------------------------------------------------------------------------ No results for input(s): CHOL, HDL, LDLCALC, TRIG, CHOLHDL, LDLDIRECT in the last 72 hours. ------------------------------------------------------------------------------------------------------------------ No results  for input(s): TSH, T4TOTAL, T3FREE, THYROIDAB in the last 72 hours.  Invalid input(s): FREET3 ------------------------------------------------------------------------------------------------------------------ No results for input(s): VITAMINB12, FOLATE, FERRITIN, TIBC, IRON, RETICCTPCT in the last 72 hours.  Coagulation profile  Recent Labs Lab 01/09/15 1000  INR 1.28    No results for input(s): DDIMER in the last 72 hours.  Cardiac Enzymes No results for input(s): CKMB, TROPONINI, MYOGLOBIN in the last 168 hours.  Invalid input(s): CK ------------------------------------------------------------------------------------------------------------------ Invalid input(s): POCBNP    Verneita Griffes, MD [facility] [phone]

## 2015-01-12 MED ORDER — TAMSULOSIN HCL 0.4 MG PO CAPS
0.4000 mg | ORAL_CAPSULE | Freq: Every day | ORAL | Status: DC
  Administered 2015-01-12 – 2015-01-15 (×4): 0.4 mg via ORAL
  Filled 2015-01-12 (×4): qty 1

## 2015-01-12 NOTE — Progress Notes (Signed)
[facility] Complaint  Patient presents with  . Abdominal Abscess       HPI on 01/08/2015 by Dr. Gennaro Africa Patient with history of MM diagnosed in Jan 2016 on Revlimid every other day who was found to have peridiverticular abscess/diverticulitis incidentally in a PET scan done 2 weeks ago with failing of outpatient management for the past two weeks. He has been taken Flagyl and Cipro for the past two weeks. Repeat CT scan today shows increase in abscess size and persistence over diverticulitis. Patient has no complaints except for fatigue/weakness that started back in January and has been progressing. He had fever/chills for the past two days but denied any abdominal pain, N/V, diarrhea, blood in stool. No other complaints.   Assessment & Plan   Acute sigmoid diverticulitis with enlarging diverticular abscess -Noted on PET scan done approximately 2 weeks ago. Patient failed outpatient treatment with ciprofloxacin and Flagyl -CT abdomen = sigmoid diverticulitis without significant improvement over the last 17 days, persistent peridiverticular abscess -Continue IV Zosyn for now -Gen. surgery consulted and appreciated. IR performed perc drain in  LLQ Ct guided on 12/6 -Continue pain control and antiemetics as needed -monitor for resolution-might consider drain study/CT scan probably in the next 2-3 days-IR and Gen surgery directing care-drain putting out consistently ~ 30 cc only  Multiple myeloma -Revlimid held.  -Patient follows with Dr. Whitney Muse, oncologist  Thrombocytopenia -Likely secondary to chemo -will continue to monitor CBC  Code Status: Full  Family Communication: None at bedside  Disposition Plan: Admitted.    Time Spent in minutes   30 minutes  Procedures  None  Consults   General surgery Interventional radiology  DVT Prophylaxis  SCDs  Lab Results  Component Value  Date   PLT 95* 01/11/2015    Medications  Scheduled Meds: . acyclovir  400 mg Oral BID  . dexamethasone  20 mg Oral Once per day on Tue Fri  . pantoprazole (PROTONIX) IV  40 mg Intravenous Q24H  . piperacillin-tazobactam (ZOSYN)  IV  3.375 g Intravenous 3 times per day  . saccharomyces boulardii  250 mg Oral BID   Continuous Infusions: . dextrose 5 % and 0.9% NaCl 100 mL/hr at 01/12/15 0234   PRN Meds:.ALPRAZolam, HYDROcodone-acetaminophen  Antibiotics    Anti-infectives    Start     Dose/Rate Route Frequency Ordered Stop   01/08/15 2200  piperacillin-tazobactam (ZOSYN) IVPB 3.375 g     3.375 g 12.5 mL/hr over 240 Minutes Intravenous 3 times per day 01/08/15 1804     01/08/15 2200  acyclovir (ZOVIRAX) tablet 400 mg     400 mg Oral 2 times daily 01/08/15 1925     01/08/15 1745  metroNIDAZOLE (FLAGYL) IVPB 1 g  Status:  Discontinued     1 g 200 mL/hr over 60 Minutes Intravenous  Once 01/08/15 1733 01/08/15 1804   01/08/15 1745  cefTRIAXone (ROCEPHIN) 1 g in dextrose 5 % 50 mL IVPB  Status:  Discontinued     1 g 100 mL/hr over 30  Minutes Intravenous  Once 01/08/15 1733 01/08/15 1804      Subjective:   Fair Confusion today abotu plan of care Long discussion with him and his daughter No pain No f/chills tol clear fairly well Passed stool this am  Objective:   Filed Vitals:   01/11/15 1430 01/11/15 2130 01/12/15 0531 01/12/15 1440  BP: 115/56 122/69 107/66 122/65  Pulse: 66 66 71 60  Temp: 98.3 F (36.8 C) 98.5 F (36.9 C) 97.4 F (36.3 C) 98.3 F (36.8 C)  TempSrc: Oral Oral Oral Oral  Resp: 20 18 18 18   Height:      Weight:      SpO2: 96% 96% 96% 97%    Wt Readings from Last 3 Encounters:  01/08/15 100.4 kg (221 lb 5.5 oz)  12/25/14 100.835 kg (222 lb 4.8 oz)  11/27/14 101.696 kg (224 lb 3.2 oz)     Intake/Output Summary (Last 24 hours) at 01/12/15 1631 Last data filed at 01/12/15 1009  Gross per 24 hour  Intake   1260 ml  Output   1080 ml  Net     180 ml    Exam  General: Well developed, well nourished, NAD, appears stated age  79: NCAT,mucous membranes moist.   Cardiovascular: S1 S2 auscultated, no rubs, murmurs or gallops. Regular rate and rhythm.  Respiratory: Clear to auscultation bilaterally with equal chest rise  Abdomen: Soft, LLQ TTP, nondistended, + bowel sounds Extremities: warm dry without cyanosis clubbing or edema  Data Review   Micro Results Recent Results (from the past 240 hour(s))  Culture, routine-abscess     Status: None (Preliminary result)   Collection Time: 01/09/15 12:24 PM  Result Value Ref Range Status   Specimen Description DRAINAGE  Final   Special Requests NONE  Final   Gram Stain   Final    ABUNDANT WBC PRESENT,BOTH PMN AND MONONUCLEAR NO SQUAMOUS EPITHELIAL CELLS SEEN ABUNDANT GRAM POSITIVE RODS MODERATE GRAM POSITIVE COCCI IN PAIRS    Culture   Final    MULTIPLE ORGANISMS PRESENT, NONE PREDOMINANT Performed at [facility]    Report Status PENDING  Incomplete    Radiology Reports Ct Abdomen Pelvis W Contrast  01/08/2015  CLINICAL DATA:  Follow up diverticulitis. History of multiple myeloma. EXAM: CT ABDOMEN AND PELVIS WITH CONTRAST TECHNIQUE: Multidetector CT imaging of the abdomen and pelvis was performed using the standard protocol following bolus administration of intravenous contrast. CONTRAST:  146m OMNIPAQUE IOHEXOL 300 MG/ML  SOLN COMPARISON:  PET-CT 08/11/2014 and 12/22/2014. FINDINGS: Lower chest: There are no significant residual findings at the visualized lung bases. There is mild linear right lower lobe scarring. Aortic and coronary artery atherosclerosis noted. Hepatobiliary: The liver is normal in density without focal abnormality. No significant biliary dilatation status post cholecystectomy. Pancreas: Unremarkable. No pancreatic ductal dilatation or surrounding inflammatory changes. Spleen: Normal in size without focal abnormality. Adrenals/Urinary Tract:  Both adrenal glands appear normal. Grossly stable left renal cortical cysts and parapelvic cysts bilaterally. No hydronephrosis or urinary tract calculus. The bladder appears unremarkable. Stomach/Bowel: The stomach, small bowel, proximal colon and appendix demonstrate no significant findings. Diverticular changes are again noted throughout the distal colon. There are persistent inflammatory changes surrounding the sigmoid colon with small extraluminal air bubbles and surrounding inflammatory change. There is a fluid collection beneath the anterior abdominal wall which contains air bubbles and measures up to 6.3 x 2.4 cm on image 74. This has mildly enlarged compared with the prior study. There is no free  intraperitoneal air or evidence of bowel obstruction. Vascular/Lymphatic: There are no enlarged abdominal or pelvic lymph nodes. Fairly extensive atherosclerosis of the aorta, its branches and the iliac arteries. No evidence of large vessel occlusion. Reproductive: Stable moderate enlargement of the prostate gland without apparent focal abnormality. Other: Stable small umbilical hernia containing only fat. Musculoskeletal: The lytic lesions in the left aspect of the L3 vertebral body and the right aspect of the sacrum are unchanged. The latter measures 2.2 cm on image 67. L4 superior endplate compression deformity appears unchanged. No new lytic lesions or pathologic fracture. IMPRESSION: 1. Persistent changes of sigmoid diverticulitis without significant improvement over the last 17 days. There is a persistent peridiverticular abscess. No evidence of obstruction. 2. No acute osseous findings or evidence of progressive multiple myeloma. Lytic L3 and right sacral lesions are unchanged. 3. Stable incidental findings including diffuse atherosclerosis, renal parapelvic cysts and prostatomegaly. 4. These results were called by telephone at the time of interpretation on 01/08/2015 at 2:45 pm to Dr. Ancil Linsey , who  verbally acknowledged these results. Electronically Signed   By: Richardean Sale M.D.   On: 01/08/2015 14:50   Nm Pet Image Restage (ps) Whole Body  12/22/2014  CLINICAL DATA:  Subsequent treatment strategy for multiple myeloma. EXAM: NUCLEAR MEDICINE PET WHOLE BODY TECHNIQUE: 11.9 mCi F-18 FDG was injected intravenously. Full-ring PET imaging was performed from the vertex to the feet after the radiotracer. CT data was obtained and used for attenuation correction and anatomic localization. FASTING BLOOD GLUCOSE:  Value:  86 mg/dl COMPARISON:  PET-CT 08/11/2014 and 03/16/2014. FINDINGS: NECK No hypermetabolic cervical lymph nodes are identified.There are no lesions of the pharyngeal mucosal space. There is stable mild symmetric prominence of activity within the submandibular glands bilaterally. CHEST There are no hypermetabolic mediastinal, hilar or axillary lymph nodes. Interval significant improvement in the inflammatory changes in the right upper lobe with associated hypermetabolic activity. There are some residual linear components without hypermetabolic activity. Within the right lower lobe, there is low-level hypermetabolic activity associated with subpleural ground-glass density. This has an SUV max of 5.8 and appears inflammatory. No suspicious pulmonary findings. ABDOMEN/PELVIS There is no hypermetabolic activity within the liver, adrenal glands, spleen or pancreas. There is no hypermetabolic nodal activity. There is extensive sigmoid diverticulosis with increased wall thickening and associated hypermetabolic activity. There is a new small lenticular shaped fluid collection with air bubbles beneath the left anterior abdominal wall, measuring 5.9 x 1.7 cm on image 195. This demonstrates hyper metabolism and is suspicious for a small abscess. No free intraperitoneal air or evidence of bowel obstruction. Moderate enlargement of the prostate gland, diffuse atherosclerosis and bilateral renal cortical and  parapelvic cysts are again noted. SKELETON There is decreased metabolic activity throughout the lumbar spine attributed to radiation therapy. There is mild mottled activity throughout the skeleton without focally increased activity to suggest residual active multiple myeloma. The right sacral lesion measures 2.3 cm on image 192 and has no residual hypermetabolic activity (SUV max 2.5). The L3 lesion appears unchanged in size without residual hypermetabolic activity. Right rib deformities are noted without abnormal metabolic activity. EXTREMITIES No hypermetabolic activity to suggest metastasis. IMPRESSION: 1. No evidence of residual metabolically active multiple myeloma in the axial or appendicular skeleton. The dominant lesions in the L3 vertebral body and right sacrum have not changed in size. 2. No evidence of extra osseous neoplasm. 3. Interval improvement in inflammatory changes in the right lung, possibly related to previous radiation therapy. 4. New  hypermetabolism in the sigmoid colon associated with changes of probable diverticulitis and a small peridiverticular abscess. Bowel rest and antibiotic therapy recommended. 5. These results will be called to the ordering clinician or representative by the Radiologist Assistant, and communication documented in the PACS or zVision Dashboard. Electronically Signed   By: Richardean Sale M.D.   On: 12/22/2014 11:11   Ct Image Guided Drainage By Percutaneous Catheter  01/09/2015  CLINICAL DATA:  Diverticular abscess EXAM: CT GUIDED DRAINAGE OF LEFT LOWER QUADRANT PERITONEAL ABSCESS ANESTHESIA/SEDATION: Intravenous Fentanyl and Versed were administered as conscious sedation during continuous cardiorespiratory monitoring by the radiology RN, with a total moderate sedation time of 11 minutes. PROCEDURE: The procedure, risks, benefits, and alternatives were explained to the patient. Questions regarding the procedure were encouraged and answered. The patient understands  and consents to the procedure. patient placed supine. select axial scans through the lower abdomen were obtained. the left lower quadrant extraluminal gas and fluid collection was localized and an appropriate skin entry site was determined and marked. The operative field was prepped with Betadinein a sterile fashion, and a sterile drape was applied covering the operative field. A sterile gown and sterile gloves were used for the procedure. Local anesthesia was provided with 1% Lidocaine. Under CT fluoroscopic guidance, a 19 gauge percutaneous entry needle was advanced into the superficial left lower quadrant peritoneal collection. Purulent material could be aspirated. An Amplatz guidewire advanced easily within the collection, its position confirmed on CT fluoroscopy. The tract was dilated to facilitate placement of a 12 French pigtail catheter, formed centrally within the collection. 5 mL of purulent aspirate were removed and sent for Gram stain, culture and sensitivity. The catheter was secured externally with 0 Prolene suture and StatLock and placed to gravity drain bag. The patient tolerated the procedure well. COMPLICATIONS: None immediate FINDINGS: The left lower quadrant superficial complex peritoneal collection measured approximately 6.1 cm maximum transverse diameter was again localized. Percutaneous drain catheter placed as above. IMPRESSION: 1. Technically successful CT-guided percutaneous peritoneal abscess drain catheter placement. Sample of the aspirate sent for Gram stain, culture and sensitivity. Electronically Signed   By: Lucrezia Europe M.D.   On: 01/09/2015 15:11    CBC  Recent Labs Lab 01/08/15 1707 01/09/15 0425 01/10/15 0410 01/11/15 0353  WBC 7.3 5.8 4.0 4.8  HGB 14.0 12.3* 11.6* 12.1*  HCT 41.5 37.1* 35.4* 36.2*  PLT 91* 72* 82* 95*  MCV 92.6 94.4 93.7 91.9  MCH 31.3 31.3 30.7 30.7  MCHC 33.7 33.2 32.8 33.4  RDW 16.5* 16.4* 16.2* 15.8*  LYMPHSABS 0.8 0.6*  --   --   MONOABS  1.0 1.0  --   --   EOSABS 0.0 0.1  --   --   BASOSABS 0.0 0.0  --   --     Chemistries   Recent Labs Lab 01/08/15 1707 01/09/15 0425 01/10/15 0410  NA 136 136 137  K 4.1 3.7 3.6  CL 102 106 110  CO2 25 26 22   GLUCOSE 95 115* 101*  BUN 17 15 11   CREATININE 0.81 0.66 0.68  CALCIUM 8.2* 7.6* 7.4*  AST  --  11*  --   ALT  --  11*  --   ALKPHOS  --  17*  --   BILITOT  --  0.9  --    ------------------------------------------------------------------------------------------------------------------ estimated creatinine clearance is 88.8 mL/min (by C-G formula based on Cr of 0.68). ------------------------------------------------------------------------------------------------------------------ No results for input(s): HGBA1C in the last 72 hours. ------------------------------------------------------------------------------------------------------------------ No  results for input(s): CHOL, HDL, LDLCALC, TRIG, CHOLHDL, LDLDIRECT in the last 72 hours. ------------------------------------------------------------------------------------------------------------------ No results for input(s): TSH, T4TOTAL, T3FREE, THYROIDAB in the last 72 hours.  Invalid input(s): FREET3 ------------------------------------------------------------------------------------------------------------------ No results for input(s): VITAMINB12, FOLATE, FERRITIN, TIBC, IRON, RETICCTPCT in the last 72 hours.  Coagulation profile  Recent Labs Lab 01/09/15 1000  INR 1.28    No results for input(s): DDIMER in the last 72 hours.  Cardiac Enzymes No results for input(s): CKMB, TROPONINI, MYOGLOBIN in the last 168 hours.  Invalid input(s): CK ------------------------------------------------------------------------------------------------------------------ Invalid input(s): POCBNP    Verneita Griffes, MD [facility] [phone]

## 2015-01-12 NOTE — Progress Notes (Signed)
  Subjective: Pt unsure of plan.  He feels pretty good, no pain.  He is drain this AM is clear, but a little cloudy still.  The red brownish purulent fluid is gone.    Objective: Vital signs in last 24 hours: Temp:  [97.4 F (36.3 C)-98.5 F (36.9 C)] 97.4 F (36.3 C) (12/09 0531) Pulse Rate:  [66-71] 71 (12/09 0531) Resp:  [18-20] 18 (12/09 0531) BP: (107-122)/(56-69) 107/66 mmHg (12/09 0531) SpO2:  [96 %] 96 % (12/09 0531) Last BM Date: 01/11/15 480 PO Drain 30 ml BM x 1 Afebrile, VSS WBC is stable, 4.0, platelets remain low CT drain 01/09/15 Intake/Output from previous day: 12/08 0701 - 12/09 0700 In: 1740 [P.O.:480; I.V.:1100; IV Piggyback:150] Out: 1055 [Urine:1025; Drains:30] Intake/Output this shift:    General appearance: alert, cooperative and no distress GI: soft, non-tender; bowel sounds normal; no masses,  no organomegaly  Lab Results:   Recent Labs  01/10/15 0410 01/11/15 0353  WBC 4.0 4.8  HGB 11.6* 12.1*  HCT 35.4* 36.2*  PLT 82* 95*    BMET  Recent Labs  01/10/15 0410  NA 137  K 3.6  CL 110  CO2 22  GLUCOSE 101*  BUN 11  CREATININE 0.68  CALCIUM 7.4*   PT/INR  Recent Labs  01/09/15 1000  LABPROT 16.1*  INR 1.28     Recent Labs Lab 01/09/15 0425  AST 11*  ALT 11*  ALKPHOS 17*  BILITOT 0.9  PROT 4.6*  ALBUMIN 2.1*     Lipase  No results found for: LIPASE   Studies/Results: No results found.  Medications: . acyclovir  400 mg Oral BID  . dexamethasone  20 mg Oral Once per day on Tue Fri  . pantoprazole (PROTONIX) IV  40 mg Intravenous Q24H  . piperacillin-tazobactam (ZOSYN)  IV  3.375 g Intravenous 3 times per day  . saccharomyces boulardii  250 mg Oral BID    Assessment/Plan Acute sigmoid diverticulitis with enlarging 6.3 x 2.4 cm diverticular abscess IR drain placement 01/09/15  Multiple Myeloma on Zometa, Dexamethasone, and lenalidomide. 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 Zella Richer, timing for repeat CT.  Tomorrow will be day 4 after drain placement.   LOS: 4 days    Danika Kluender 01/12/2015

## 2015-01-13 ENCOUNTER — Inpatient Hospital Stay (HOSPITAL_COMMUNITY): Payer: Medicare Other

## 2015-01-13 LAB — BASIC METABOLIC PANEL
Anion gap: 4 — ABNORMAL LOW (ref 5–15)
BUN: 6 mg/dL (ref 6–20)
CO2: 22 mmol/L (ref 22–32)
Calcium: 7.2 mg/dL — ABNORMAL LOW (ref 8.9–10.3)
Chloride: 115 mmol/L — ABNORMAL HIGH (ref 101–111)
Creatinine, Ser: 0.51 mg/dL — ABNORMAL LOW (ref 0.61–1.24)
GFR calc Af Amer: >60 mL/min (ref >60)
GFR calc non Af Amer: >60 mL/min (ref >60)
Glucose, Bld: 207 mg/dL — ABNORMAL HIGH (ref 65–99)
Potassium: 3.8 mmol/L (ref 3.5–5.1)
Sodium: 141 mmol/L (ref 135–145)

## 2015-01-13 LAB — CBC
HCT: 35.3 % — ABNORMAL LOW (ref 39.0–52.0)
Hemoglobin: 11.7 g/dL — ABNORMAL LOW (ref 13.0–17.0)
MCH: 30.2 pg (ref 26.0–34.0)
MCHC: 33.1 g/dL (ref 30.0–36.0)
MCV: 91 fL (ref 78.0–100.0)
Platelets: 115 10*3/uL — ABNORMAL LOW (ref 150–400)
RBC: 3.88 MIL/uL — ABNORMAL LOW (ref 4.22–5.81)
RDW: 15.5 % (ref 11.5–15.5)
WBC: 1.9 10*3/uL — ABNORMAL LOW (ref 4.0–10.5)

## 2015-01-13 LAB — CULTURE, ROUTINE-ABSCESS

## 2015-01-13 MED ORDER — IOHEXOL 300 MG/ML  SOLN
100.0000 mL | Freq: Once | INTRAMUSCULAR | Status: AC | PRN
  Administered 2015-01-13: 100 mL via INTRAVENOUS

## 2015-01-13 MED ORDER — IOHEXOL 300 MG/ML  SOLN
50.0000 mL | Freq: Once | INTRAMUSCULAR | Status: AC | PRN
  Administered 2015-01-13: 50 mL via ORAL

## 2015-01-13 NOTE — Progress Notes (Signed)
  Subjective: Pt states he is getting CT today.  Having no pain.  He is drain this AM is purulent.     Objective: Vital signs in last 24 hours: Temp:  [97.8 F (36.6 C)-98.3 F (36.8 C)] 98.3 F (36.8 C) (12/10 0541) Pulse Rate:  [60-62] 62 (12/10 0541) Resp:  [17-18] 18 (12/10 0541) BP: (113-134)/(65-71) 134/71 mmHg (12/10 0541) SpO2:  [95 %-97 %] 95 % (12/10 0541) Last BM Date: 01/11/15  CT drain 01/09/15 Intake/Output from previous day: 12/09 0701 - 12/10 0700 In: -  Out: 1300 [Urine:1300] Intake/Output this shift: Total I/O In: -  Out: 250 [Urine:250]  General appearance: alert, cooperative and no distress GI: soft, non-tender; bowel sounds normal; no masses,  no organomegaly  Lab Results:   Recent Labs  01/11/15 0353 01/13/15 0408  WBC 4.8 1.9*  HGB 12.1* 11.7*  HCT 36.2* 35.3*  PLT 95* 115*    BMET  Recent Labs  01/13/15 0408  NA 141  K 3.8  CL 115*  CO2 22  GLUCOSE 207*  BUN 6  CREATININE 0.51*  CALCIUM 7.2*   PT/INR No results for input(s): LABPROT, INR in the last 72 hours.   Recent Labs Lab 01/09/15 0425  AST 11*  ALT 11*  ALKPHOS 17*  BILITOT 0.9  PROT 4.6*  ALBUMIN 2.1*     Lipase  No results found for: LIPASE   Studies/Results: No results found.  Medications: . acyclovir  400 mg Oral BID  . dexamethasone  20 mg Oral Once per day on Tue Fri  . pantoprazole (PROTONIX) IV  40 mg Intravenous Q24H  . piperacillin-tazobactam (ZOSYN)  IV  3.375 g Intravenous 3 times per day  . saccharomyces boulardii  250 mg Oral BID  . tamsulosin  0.4 mg Oral Daily    Assessment/Plan Acute sigmoid diverticulitis with enlarging 6.3 x 2.4 cm diverticular abscess IR drain placement 01/09/15  Multiple Myeloma on Zometa, Dexamethasone, and lenalidomide. Hx of radiation therapy to right chest, spine and left occipitalcondyle Neuropathy Hx o f ETOH abuse Thrombocytopenia  Antibiotics: Zosyn/acyclovir day 5 DVT: Adding SCD's/currently  platelets low, defer to Medicine   Plan:  Repeat CT today.   Pt seems to be getting better.    LOS: 5 days    Dean [name] C. [id number]

## 2015-01-13 NOTE — Progress Notes (Signed)
[facility] Complaint  Patient presents with  . Abdominal Abscess       HPI on 01/08/2015 by Dr. Gennaro Africa Patient with history of MM diagnosed in Jan 2016 on Revlimid every other day who was found to have peridiverticular abscess/diverticulitis incidentally in a PET scan done 2 weeks ago with failing of outpatient management for the past two weeks. He has been taken Flagyl and Cipro for the past two weeks. Repeat CT scan today shows increase in abscess size and persistence over diverticulitis. Patient has no complaints except for fatigue/weakness that started back in January and has been progressing. He had fever/chills for the past two days but denied any abdominal pain, N/V, diarrhea, blood in stool. No other complaints.   Assessment & Plan   Acute sigmoid diverticulitis with enlarging diverticular abscess -Noted on PET scan done approximately 2 weeks ago. Patient failed outpatient treatment with ciprofloxacin and Flagyl -CT abdomen = sigmoid diverticulitis without significant improvement over the last 17 days, persistent peridiverticular abscess -Continue IV Zosyn for now  -Gen. surgery consulted and appreciated. IR performed perc drain in  LLQ Ct guided on 12/6 -Continue pain control and antiemetics as needed -monitor for resolution-might consider drain study/CT scan pending from today 12/10 -IR and Gen surgery directing care-drain putting out consistently ~ 30 cc only  Multiple myeloma -Revlimid held.  -Patient follows with Dr. Whitney Muse, oncologist  Thrombocytopenia -Likely secondary to chemo -will continue to monitor CBC  Leucopenia Monitor Rpt cbc in am  reassess   Code Status: Full  Family Communication: None at bedside  Disposition Plan: Admitted.    Time Spent in minutes   15  Procedures  None  Consults   General surgery Interventional radiology  DVT Prophylaxis   SCDs  Lab Results  Component Value Date   PLT 115* 01/13/2015    Medications  Scheduled Meds: . acyclovir  400 mg Oral BID  . dexamethasone  20 mg Oral Once per day on Tue Fri  . pantoprazole (PROTONIX) IV  40 mg Intravenous Q24H  . piperacillin-tazobactam (ZOSYN)  IV  3.375 g Intravenous 3 times per day  . saccharomyces boulardii  250 mg Oral BID  . tamsulosin  0.4 mg Oral Daily   Continuous Infusions: . dextrose 5 % and 0.9% NaCl 100 mL/hr at 01/13/15 1011   PRN Meds:.ALPRAZolam, HYDROcodone-acetaminophen  Antibiotics    Anti-infectives    Start     Dose/Rate Route Frequency Ordered Stop   01/08/15 2200  piperacillin-tazobactam (ZOSYN) IVPB 3.375 g     3.375 g 12.5 mL/hr over 240 Minutes Intravenous 3 times per day 01/08/15 1804     01/08/15 2200  acyclovir (ZOVIRAX) tablet 400 mg     400 mg Oral 2 times daily 01/08/15 1925     01/08/15 1745  metroNIDAZOLE (FLAGYL) IVPB 1 g  Status:  Discontinued     1 g 200 mL/hr over 60 Minutes Intravenous  Once 01/08/15 1733 01/08/15 1804   01/08/15 1745  cefTRIAXone (ROCEPHIN) 1 g in dextrose 5 %  50 mL IVPB  Status:  Discontinued     1 g 100 mL/hr over 30 Minutes Intravenous  Once 01/08/15 1733 01/08/15 1804      Subjective:   Doing ok  Feels well Awaiting CT NO pain no fever no chills  Objective:   Filed Vitals:   01/12/15 0531 01/12/15 1440 01/12/15 2057 01/13/15 0541  BP: 107/66 122/65 113/67 134/71  Pulse: 71 60 61 62  Temp: 97.4 F (36.3 C) 98.3 F (36.8 C) 97.8 F (36.6 C) 98.3 F (36.8 C)  TempSrc: Oral Oral Oral Oral  Resp: _0 Height:      Weight:      SpO2: 96% 97% 97% 95%    Wt Readings from Last 3 Encounters:  01/08/15 100.4 kg (221 lb 5.5 oz)  12/25/14 100.835 kg (222 lb 4.8 oz)  11/27/14 101.696 kg (224 lb 3.2 oz)     Intake/Output Summary (Last 24 hours) at 01/13/15 1334 Last data filed at 01/13/15 0900  Gross per 24 hour  Intake      0 ml  Output   1250 ml  Net  -1250 ml     Exam  General: Well developed, well nourished, NAD, appears stated age  29: NCAT,mucous membranes moist.   Cardiovascular: S1 S2 auscultated, no rubs, murmurs or gallops. Regular rate and rhythm.  Respiratory: Clear to auscultation bilaterally with equal chest rise  Abdomen: Soft, LLQ TTP, nondistended, + bowel sounds   Data Review   Micro Results Recent Results (from the past 240 hour(s))  Culture, routine-abscess     Status: None   Collection Time: 01/09/15 12:24 PM  Result Value Ref Range Status   Specimen Description DRAINAGE  Final   Special Requests NONE  Final   Gram Stain   Final    ABUNDANT WBC PRESENT,BOTH PMN AND MONONUCLEAR NO SQUAMOUS EPITHELIAL CELLS SEEN ABUNDANT GRAM POSITIVE RODS MODERATE GRAM POSITIVE COCCI IN PAIRS    Culture   Final    MULTIPLE ORGANISMS PRESENT, NONE PREDOMINANT Note: NO STAPHYLOCOCCUS AUREUS ISOLATED NO GROUP A STREP (S.PYOGENES) ISOLATED Performed at [facility]    Report Status 01/13/2015 FINAL  Final    Radiology Reports Ct Abdomen Pelvis W Contrast  01/08/2015  CLINICAL DATA:  Follow up diverticulitis. History of multiple myeloma. EXAM: CT ABDOMEN AND PELVIS WITH CONTRAST TECHNIQUE: Multidetector CT imaging of the abdomen and pelvis was performed using the standard protocol following bolus administration of intravenous contrast. CONTRAST:  162m OMNIPAQUE IOHEXOL 300 MG/ML  SOLN COMPARISON:  PET-CT 08/11/2014 and 12/22/2014. FINDINGS: Lower chest: There are no significant residual findings at the visualized lung bases. There is mild linear right lower lobe scarring. Aortic and coronary artery atherosclerosis noted. Hepatobiliary: The liver is normal in density without focal abnormality. No significant biliary dilatation status post cholecystectomy. Pancreas: Unremarkable. No pancreatic ductal dilatation or surrounding inflammatory changes. Spleen: Normal in size without focal abnormality. Adrenals/Urinary Tract: Both  adrenal glands appear normal. Grossly stable left renal cortical cysts and parapelvic cysts bilaterally. No hydronephrosis or urinary tract calculus. The bladder appears unremarkable. Stomach/Bowel: The stomach, small bowel, proximal colon and appendix demonstrate no significant findings. Diverticular changes are again noted throughout the distal colon. There are persistent inflammatory changes surrounding the sigmoid colon with small extraluminal air bubbles and surrounding inflammatory change. There is a fluid collection beneath the anterior abdominal wall which contains air bubbles and measures up to 6.3 x 2.4 cm on image 74. This has mildly enlarged compared  with the prior study. There is no free intraperitoneal air or evidence of bowel obstruction. Vascular/Lymphatic: There are no enlarged abdominal or pelvic lymph nodes. Fairly extensive atherosclerosis of the aorta, its branches and the iliac arteries. No evidence of large vessel occlusion. Reproductive: Stable moderate enlargement of the prostate gland without apparent focal abnormality. Other: Stable small umbilical hernia containing only fat. Musculoskeletal: The lytic lesions in the left aspect of the L3 vertebral body and the right aspect of the sacrum are unchanged. The latter measures 2.2 cm on image 67. L4 superior endplate compression deformity appears unchanged. No new lytic lesions or pathologic fracture. IMPRESSION: 1. Persistent changes of sigmoid diverticulitis without significant improvement over the last 17 days. There is a persistent peridiverticular abscess. No evidence of obstruction. 2. No acute osseous findings or evidence of progressive multiple myeloma. Lytic L3 and right sacral lesions are unchanged. 3. Stable incidental findings including diffuse atherosclerosis, renal parapelvic cysts and prostatomegaly. 4. These results were called by telephone at the time of interpretation on 01/08/2015 at 2:45 pm to Dr. Ancil Linsey , who  verbally acknowledged these results. Electronically Signed   By: Richardean Sale M.D.   On: 01/08/2015 14:50   Nm Pet Image Restage (ps) Whole Body  12/22/2014  CLINICAL DATA:  Subsequent treatment strategy for multiple myeloma. EXAM: NUCLEAR MEDICINE PET WHOLE BODY TECHNIQUE: 11.9 mCi F-18 FDG was injected intravenously. Full-ring PET imaging was performed from the vertex to the feet after the radiotracer. CT data was obtained and used for attenuation correction and anatomic localization. FASTING BLOOD GLUCOSE:  Value:  86 mg/dl COMPARISON:  PET-CT 08/11/2014 and 03/16/2014. FINDINGS: NECK No hypermetabolic cervical lymph nodes are identified.There are no lesions of the pharyngeal mucosal space. There is stable mild symmetric prominence of activity within the submandibular glands bilaterally. CHEST There are no hypermetabolic mediastinal, hilar or axillary lymph nodes. Interval significant improvement in the inflammatory changes in the right upper lobe with associated hypermetabolic activity. There are some residual linear components without hypermetabolic activity. Within the right lower lobe, there is low-level hypermetabolic activity associated with subpleural ground-glass density. This has an SUV max of 5.8 and appears inflammatory. No suspicious pulmonary findings. ABDOMEN/PELVIS There is no hypermetabolic activity within the liver, adrenal glands, spleen or pancreas. There is no hypermetabolic nodal activity. There is extensive sigmoid diverticulosis with increased wall thickening and associated hypermetabolic activity. There is a new small lenticular shaped fluid collection with air bubbles beneath the left anterior abdominal wall, measuring 5.9 x 1.7 cm on image 195. This demonstrates hyper metabolism and is suspicious for a small abscess. No free intraperitoneal air or evidence of bowel obstruction. Moderate enlargement of the prostate gland, diffuse atherosclerosis and bilateral renal cortical and  parapelvic cysts are again noted. SKELETON There is decreased metabolic activity throughout the lumbar spine attributed to radiation therapy. There is mild mottled activity throughout the skeleton without focally increased activity to suggest residual active multiple myeloma. The right sacral lesion measures 2.3 cm on image 192 and has no residual hypermetabolic activity (SUV max 2.5). The L3 lesion appears unchanged in size without residual hypermetabolic activity. Right rib deformities are noted without abnormal metabolic activity. EXTREMITIES No hypermetabolic activity to suggest metastasis. IMPRESSION: 1. No evidence of residual metabolically active multiple myeloma in the axial or appendicular skeleton. The dominant lesions in the L3 vertebral body and right sacrum have not changed in size. 2. No evidence of extra osseous neoplasm. 3. Interval improvement in inflammatory changes in the right lung,  possibly related to previous radiation therapy. 4. New hypermetabolism in the sigmoid colon associated with changes of probable diverticulitis and a small peridiverticular abscess. Bowel rest and antibiotic therapy recommended. 5. These results will be called to the ordering clinician or representative by the Radiologist Assistant, and communication documented in the PACS or zVision Dashboard. Electronically Signed   By: Richardean Sale M.D.   On: 12/22/2014 11:11   Ct Image Guided Drainage By Percutaneous Catheter  01/09/2015  CLINICAL DATA:  Diverticular abscess EXAM: CT GUIDED DRAINAGE OF LEFT LOWER QUADRANT PERITONEAL ABSCESS ANESTHESIA/SEDATION: Intravenous Fentanyl and Versed were administered as conscious sedation during continuous cardiorespiratory monitoring by the radiology RN, with a total moderate sedation time of 11 minutes. PROCEDURE: The procedure, risks, benefits, and alternatives were explained to the patient. Questions regarding the procedure were encouraged and answered. The patient understands  and consents to the procedure. patient placed supine. select axial scans through the lower abdomen were obtained. the left lower quadrant extraluminal gas and fluid collection was localized and an appropriate skin entry site was determined and marked. The operative field was prepped with Betadinein a sterile fashion, and a sterile drape was applied covering the operative field. A sterile gown and sterile gloves were used for the procedure. Local anesthesia was provided with 1% Lidocaine. Under CT fluoroscopic guidance, a 19 gauge percutaneous entry needle was advanced into the superficial left lower quadrant peritoneal collection. Purulent material could be aspirated. An Amplatz guidewire advanced easily within the collection, its position confirmed on CT fluoroscopy. The tract was dilated to facilitate placement of a 12 French pigtail catheter, formed centrally within the collection. 5 mL of purulent aspirate were removed and sent for Gram stain, culture and sensitivity. The catheter was secured externally with 0 Prolene suture and StatLock and placed to gravity drain bag. The patient tolerated the procedure well. COMPLICATIONS: None immediate FINDINGS: The left lower quadrant superficial complex peritoneal collection measured approximately 6.1 cm maximum transverse diameter was again localized. Percutaneous drain catheter placed as above. IMPRESSION: 1. Technically successful CT-guided percutaneous peritoneal abscess drain catheter placement. Sample of the aspirate sent for Gram stain, culture and sensitivity. Electronically Signed   By: Lucrezia Europe M.D.   On: 01/09/2015 15:11    CBC  Recent Labs Lab 01/08/15 1707 01/09/15 0425 01/10/15 0410 01/11/15 0353 01/13/15 0408  WBC 7.3 5.8 4.0 4.8 1.9*  HGB 14.0 12.3* 11.6* 12.1* 11.7*  HCT 41.5 37.1* 35.4* 36.2* 35.3*  PLT 91* 72* 82* 95* 115*  MCV 92.6 94.4 93.7 91.9 91.0  MCH 31.3 31.3 30.7 30.7 30.2  MCHC 33.7 33.2 32.8 33.4 33.1  RDW 16.5* 16.4*  16.2* 15.8* 15.5  LYMPHSABS 0.8 0.6*  --   --   --   MONOABS 1.0 1.0  --   --   --   EOSABS 0.0 0.1  --   --   --   BASOSABS 0.0 0.0  --   --   --     Chemistries   Recent Labs Lab 01/08/15 1707 01/09/15 0425 01/10/15 0410 01/13/15 0408  NA 136 136 137 141  K 4.1 3.7 3.6 3.8  CL 102 106 110 115*  CO2 _0 GLUCOSE 95 115* 101* 207*  BUN _1 CREATININE 0.81 0.66 0.68 0.51*  CALCIUM 8.2* 7.6* 7.4* 7.2*  AST  --  11*  --   --   ALT  --  11*  --   --   ALKPHOS  --  17*  --   --   BILITOT  --  0.9  --   --    ------------------------------------------------------------------------------------------------------------------ estimated creatinine clearance is 88.8 mL/min (by C-G formula based on Cr of 0.51). ------------------------------------------------------------------------------------------------------------------ No results for input(s): HGBA1C in the last 72 hours. ------------------------------------------------------------------------------------------------------------------ No results for input(s): CHOL, HDL, LDLCALC, TRIG, CHOLHDL, LDLDIRECT in the last 72 hours. ------------------------------------------------------------------------------------------------------------------ No results for input(s): TSH, T4TOTAL, T3FREE, THYROIDAB in the last 72 hours.  Invalid input(s): FREET3 ------------------------------------------------------------------------------------------------------------------ No results for input(s): VITAMINB12, FOLATE, FERRITIN, TIBC, IRON, RETICCTPCT in the last 72 hours.  Coagulation profile  Recent Labs Lab 01/09/15 1000  INR 1.28    No results for input(s): DDIMER in the last 72 hours.  Cardiac Enzymes No results for input(s): CKMB, TROPONINI, MYOGLOBIN in the last 168 hours.  Invalid input(s): CK ------------------------------------------------------------------------------------------------------------------ Invalid  input(s): POCBNP    Verneita Griffes, MD [facility] [phone]

## 2015-01-14 LAB — COMPREHENSIVE METABOLIC PANEL
ALT: 11 U/L — ABNORMAL LOW (ref 17–63)
AST: 14 U/L — ABNORMAL LOW (ref 15–41)
Albumin: 2.3 g/dL — ABNORMAL LOW (ref 3.5–5.0)
Alkaline Phosphatase: 17 U/L — ABNORMAL LOW (ref 38–126)
Anion gap: 7 (ref 5–15)
BUN: 8 mg/dL (ref 6–20)
CO2: 22 mmol/L (ref 22–32)
Calcium: 7.2 mg/dL — ABNORMAL LOW (ref 8.9–10.3)
Chloride: 115 mmol/L — ABNORMAL HIGH (ref 101–111)
Creatinine, Ser: 0.53 mg/dL — ABNORMAL LOW (ref 0.61–1.24)
GFR calc Af Amer: >60 mL/min (ref >60)
GFR calc non Af Amer: >60 mL/min (ref >60)
Glucose, Bld: 148 mg/dL — ABNORMAL HIGH (ref 65–99)
Potassium: 3.2 mmol/L — ABNORMAL LOW (ref 3.5–5.1)
Sodium: 144 mmol/L (ref 135–145)
Total Bilirubin: 0.6 mg/dL (ref 0.3–1.2)
Total Protein: 4.5 g/dL — ABNORMAL LOW (ref 6.5–8.1)

## 2015-01-14 LAB — CBC WITH DIFFERENTIAL/PLATELET
Basophils Absolute: 0 10*3/uL (ref 0.0–0.1)
Basophils Relative: 0 %
Eosinophils Absolute: 0 10*3/uL (ref 0.0–0.7)
Eosinophils Relative: 0 %
HCT: 33.7 % — ABNORMAL LOW (ref 39.0–52.0)
Hemoglobin: 11.3 g/dL — ABNORMAL LOW (ref 13.0–17.0)
Lymphocytes Relative: 10 %
Lymphs Abs: 0.6 10*3/uL — ABNORMAL LOW (ref 0.7–4.0)
MCH: 30.5 pg (ref 26.0–34.0)
MCHC: 33.5 g/dL (ref 30.0–36.0)
MCV: 90.8 fL (ref 78.0–100.0)
Monocytes Absolute: 0.6 10*3/uL (ref 0.1–1.0)
Monocytes Relative: 11 %
Neutro Abs: 4.4 10*3/uL (ref 1.7–7.7)
Neutrophils Relative %: 79 %
Platelets: 131 10*3/uL — ABNORMAL LOW (ref 150–400)
RBC: 3.71 MIL/uL — ABNORMAL LOW (ref 4.22–5.81)
RDW: 15.8 % — ABNORMAL HIGH (ref 11.5–15.5)
WBC: 5.6 10*3/uL (ref 4.0–10.5)

## 2015-01-14 MED ORDER — PANTOPRAZOLE SODIUM 40 MG PO TBEC
40.0000 mg | DELAYED_RELEASE_TABLET | Freq: Every day | ORAL | Status: DC
  Administered 2015-01-14: 40 mg via ORAL
  Filled 2015-01-14: qty 1

## 2015-01-14 MED ORDER — AMOXICILLIN-POT CLAVULANATE 875-125 MG PO TABS: 1.0000 | ORAL_TABLET | Freq: Two times a day (BID) | ORAL | Status: DC

## 2015-01-14 NOTE — Progress Notes (Signed)
Key Points: Use following P&T approved IV to PO change policy.  Description contains the criteria that are approved Note: Policy Excludes:  Esophagectomy patients PHARMACIST - PHYSICIAN COMMUNICATION DR:   Verlon Au CONCERNING: IV to Oral Route Change Policy  RECOMMENDATION: This patient is receiving Protonix by the intravenous route.  Based on criteria approved by the Pharmacy and Therapeutics Committee, the intravenous medication(s) is/are being converted to the equivalent oral dose form(s).   DESCRIPTION: These criteria include:  The patient is eating (either orally or via tube) and/or has been taking other orally administered medications for a least 24 hours  The patient has no evidence of active gastrointestinal bleeding or impaired GI absorption (gastrectomy, short bowel, patient on TNA or NPO).  If you have questions about this conversion, please contact the Pharmacy Department  []   [phone] )  [facility] []   [phone] )  [facility] []   [phone] )  [facility] []   [phone] )  [facility] [x]   [phone] )  [place], PharmD, BCPS 01/14/2015 12:23 PM

## 2015-01-14 NOTE — Progress Notes (Signed)
Pharmacy Antibiotic Time-Out Note  Dean [name] is a 79 y.o. year-old male admitted on 01/08/2015.  The patient is currently on day #7 Zosyn 3.375g IV q8h (4 hour infusion time) for acute sigmoid diverticulitis with enlarging diverticular abscess.  Assessment/Plan: This patient's current antibiotics will be continued without adjustments.  MD note today indicates Zosyn will be transitioned to Augmentin to complete 14 days total antibiotics  Temp (24hrs), Avg:98.1 F (36.7 C), Min:97.7 F (36.5 C), Max:98.4 F (36.9 C)   Recent Labs Lab 01/09/15 0425 01/10/15 0410 01/11/15 0353 01/13/15 0408 01/14/15 0426  WBC 5.8 4.0 4.8 1.9* 5.6     Recent Labs Lab 01/08/15 1707 01/09/15 0425 01/10/15 0410 01/13/15 0408 01/14/15 0426  CREATININE 0.81 0.66 0.68 0.51* 0.53*   Estimated Creatinine Clearance: 88.8 mL/min (by C-G formula based on Cr of 0.53).    Antimicrobial allergies: none  Antimicrobials this admission: 12/5 >> Zosyn >>  Levels/dose changes this admission: -  Microbiology results this admission: 12/6 abscess drainage cx: multiple organisms presents, none predominant - Gram stain showing abundant GPR and moderate GPC  Thank you for allowing pharmacy to be a part of this patient's care.  Peggyann Juba, PharmD, BCPS Pager: [phone]  01/14/2015 12:16 PM

## 2015-01-14 NOTE — Progress Notes (Signed)
[facility] Complaint  Patient presents with  . Abdominal Abscess       HPI on 01/08/2015 by Dr. Gennaro Africa Patient with history of MM diagnosed in Jan 2016 on Revlimid every other day who was found to have peridiverticular abscess/diverticulitis incidentally in a PET scan done 2 weeks ago with failing of outpatient management for the past two weeks. He has been taken Flagyl and Cipro for the past two weeks. Repeat CT scan today shows increase in abscess size and persistence over diverticulitis. Patient has no complaints except for fatigue/weakness that started back in January and has been progressing. He had fever/chills for the past two days but denied any abdominal pain, N/V, diarrhea, blood in stool. No other complaints.   Assessment & Plan   Acute sigmoid diverticulitis with enlarging diverticular abscess -Noted on PET scan done approximately 2 weeks ago. Patient failed outpatient treatment with ciprofloxacin and Flagyl -CT abdomen = sigmoid diverticulitis without significant improvement over the last 17 days, persistent peridiverticular abscess -CT scan from  12/10 shows resolution of abscess -grad diet as per Gen surgery -transition IV Zosyn 12.5--->Augmentin 01/14/15--would complete 14 days total -Gen. surgery consulted and appreciated. IR performed perc drain in  LLQ Ct guided on 12/6 -consider d/c drain per IR and follow up as drain output diminished -might be able to d/c home soon?   Multiple myeloma -Revlimid held.  -Patient follows with Dr. Whitney Muse, oncologist  Thrombocytopenia -Likely secondary to chemo -will continue to monitor CBC  Leucopenia Monitor Rpt cbc in am  reassess   Code Status: Full  Family Communication: None at bedside  Disposition Plan: Admitted.    Time Spent in minutes   15  Procedures  None  Consults   General surgery Interventional  radiology  DVT Prophylaxis  SCDs  Lab Results  Component Value Date   PLT 131* 01/14/2015    Medications  Scheduled Meds: . acyclovir  400 mg Oral BID  . dexamethasone  20 mg Oral Once per day on Tue Fri  . pantoprazole (PROTONIX) IV  40 mg Intravenous Q24H  . piperacillin-tazobactam (ZOSYN)  IV  3.375 g Intravenous 3 times per day  . saccharomyces boulardii  250 mg Oral BID  . tamsulosin  0.4 mg Oral Daily   Continuous Infusions: . dextrose 5 % and 0.9% NaCl 100 mL/hr at 01/13/15 2204   PRN Meds:.ALPRAZolam, HYDROcodone-acetaminophen  Antibiotics    Anti-infectives    Start     Dose/Rate Route Frequency Ordered Stop   01/08/15 2200  piperacillin-tazobactam (ZOSYN) IVPB 3.375 g     3.375 g 12.5 mL/hr over 240 Minutes Intravenous 3 times per day 01/08/15 1804     01/08/15 2200  acyclovir (ZOVIRAX) tablet 400 mg     400 mg Oral 2 times daily 01/08/15 1925     01/08/15 1745  metroNIDAZOLE (FLAGYL) IVPB 1 g  Status:  Discontinued     1 g 200 mL/hr over 60 Minutes Intravenous  Once 01/08/15 1733 01/08/15 1804   01/08/15 1745  cefTRIAXone (  ROCEPHIN) 1 g in dextrose 5 % 50 mL IVPB  Status:  Discontinued     1 g 100 mL/hr over 30 Minutes Intravenous  Once 01/08/15 1733 01/08/15 1804      Subjective:   Well No other issues Feels better each day No n/v No cp   Objective:   Filed Vitals:   01/12/15 2057 01/13/15 0541 01/13/15 1310 01/13/15 2148  BP: 113/67 134/71 112/60 116/63  Pulse: 61 62 57 65  Temp: 97.8 F (36.6 C) 98.3 F (36.8 C) 98.4 F (36.9 C) 97.7 F (36.5 C)  TempSrc: Oral Oral Oral Oral  Resp: 17 18 18 20   Height:      Weight:      SpO2: 97% 95% 98% 98%    Wt Readings from Last 3 Encounters:  01/08/15 100.4 kg (221 lb 5.5 oz)  12/25/14 100.835 kg (222 lb 4.8 oz)  11/27/14 101.696 kg (224 lb 3.2 oz)     Intake/Output Summary (Last 24 hours) at 01/14/15 1054 Last data filed at 01/13/15 1802  Gross per 24 hour  Intake    645 ml  Output       0 ml  Net    645 ml    Exam  General: Well developed, well nourished, NAD, appears stated age  30: NCAT,mucous membranes moist.   Cardiovascular: S1 S2 auscultated, no rubs, murmurs or gallops. Regular rate and rhythm.  Respiratory: Clear to auscultation bilaterally with equal chest rise  Abdomen: Soft, LLQ TTP, nondistended, + bowel sounds   Data Review   Micro Results Recent Results (from the past 240 hour(s))  Culture, routine-abscess     Status: None   Collection Time: 01/09/15 12:24 PM  Result Value Ref Range Status   Specimen Description DRAINAGE  Final   Special Requests NONE  Final   Gram Stain   Final    ABUNDANT WBC PRESENT,BOTH PMN AND MONONUCLEAR NO SQUAMOUS EPITHELIAL CELLS SEEN ABUNDANT GRAM POSITIVE RODS MODERATE GRAM POSITIVE COCCI IN PAIRS    Culture   Final    MULTIPLE ORGANISMS PRESENT, NONE PREDOMINANT Note: NO STAPHYLOCOCCUS AUREUS ISOLATED NO GROUP A STREP (S.PYOGENES) ISOLATED Performed at [facility]    Report Status 01/13/2015 FINAL  Final    Radiology Reports Ct Abdomen Pelvis W Contrast  01/13/2015  CLINICAL DATA:  Sigmoid diverticulitis with colonic diverticular abscess EXAM: CT ABDOMEN AND PELVIS WITH CONTRAST TECHNIQUE: Multidetector CT imaging of the abdomen and pelvis was performed using the standard protocol following bolus administration of intravenous contrast. CONTRAST:  75m OMNIPAQUE IOHEXOL 300 MG/ML SOLN, 1018mOMNIPAQUE IOHEXOL 300 MG/ML SOLN COMPARISON:  01/09/2015 and 01/08/2015 FINDINGS: Sagittal images of the spine shows osteopenia and degenerative changes thoracolumbar spine. There is small right pleural effusion with right base posterior atelectasis. Tiny calcified granuloma right base posteriorly. Lytic lesion in L3 vertebral body and sacrum are without change. Enhanced liver, pancreas, spleen and adrenal glands are unremarkable. Left renal cortical and parapelvic cysts are stable. No hydronephrosis or  hydroureter. Delayed renal images shows bilateral renal symmetrical excretion. Bilateral visualized proximal ureter is unremarkable. There is no small bowel obstruction. Small umbilical hernia containing fat without evidence of acute complication. No pericecal inflammation. Normal appendix. The terminal ileum is unremarkable. Again noted scattered diverticula in descending colon. Multiple sigmoid colon diverticula are noted. There is a percutaneous drain in left lower abdomen. The fluid collection inferior to fascia left rectus muscle is resolved. No any residual fluid. Again noted mild inflammatory changes surrounding proximal  sigmoid colon. Small amount of extraluminal air and tiny amount of fluid just anterior to sigmoid colon consistent with contained perforation again noted. No extraluminal contrast material. No definite mesenteric abscess. Again noted enlarged prostate gland with indentation of urinary bladder base. On there is no evidence of free abdominal air. No significant adenopathy. No inguinal adenopathy. IMPRESSION: 1. There is a percutaneous left abdominal drain with the tip under left abdominal wall at the site of previous the previous fluid collection which has resolved. No any residual abscess or fluid. 2. Again noted sigmoid colon multiple diverticula. Persistent mild inflammatory changes just anterior to sigmoid colon, tiny amount of air bubbles/extraluminal air and small amount of fluid consistent with contained perforation please see axial image 60. There is no evidence of confluent abscess. No new mesenteric abscess. 3. No pericecal inflammation.  Normal appendix. 4. No small bowel obstruction. 5. No hydronephrosis or hydroureter.  Stable left renal cysts. Electronically Signed   By: Lahoma Crocker M.D.   On: 01/13/2015 14:56   Ct Abdomen Pelvis W Contrast  01/08/2015  CLINICAL DATA:  Follow up diverticulitis. History of multiple myeloma. EXAM: CT ABDOMEN AND PELVIS WITH CONTRAST TECHNIQUE:  Multidetector CT imaging of the abdomen and pelvis was performed using the standard protocol following bolus administration of intravenous contrast. CONTRAST:  180m OMNIPAQUE IOHEXOL 300 MG/ML  SOLN COMPARISON:  PET-CT 08/11/2014 and 12/22/2014. FINDINGS: Lower chest: There are no significant residual findings at the visualized lung bases. There is mild linear right lower lobe scarring. Aortic and coronary artery atherosclerosis noted. Hepatobiliary: The liver is normal in density without focal abnormality. No significant biliary dilatation status post cholecystectomy. Pancreas: Unremarkable. No pancreatic ductal dilatation or surrounding inflammatory changes. Spleen: Normal in size without focal abnormality. Adrenals/Urinary Tract: Both adrenal glands appear normal. Grossly stable left renal cortical cysts and parapelvic cysts bilaterally. No hydronephrosis or urinary tract calculus. The bladder appears unremarkable. Stomach/Bowel: The stomach, small bowel, proximal colon and appendix demonstrate no significant findings. Diverticular changes are again noted throughout the distal colon. There are persistent inflammatory changes surrounding the sigmoid colon with small extraluminal air bubbles and surrounding inflammatory change. There is a fluid collection beneath the anterior abdominal wall which contains air bubbles and measures up to 6.3 x 2.4 cm on image 74. This has mildly enlarged compared with the prior study. There is no free intraperitoneal air or evidence of bowel obstruction. Vascular/Lymphatic: There are no enlarged abdominal or pelvic lymph nodes. Fairly extensive atherosclerosis of the aorta, its branches and the iliac arteries. No evidence of large vessel occlusion. Reproductive: Stable moderate enlargement of the prostate gland without apparent focal abnormality. Other: Stable small umbilical hernia containing only fat. Musculoskeletal: The lytic lesions in the left aspect of the L3 vertebral body  and the right aspect of the sacrum are unchanged. The latter measures 2.2 cm on image 67. L4 superior endplate compression deformity appears unchanged. No new lytic lesions or pathologic fracture. IMPRESSION: 1. Persistent changes of sigmoid diverticulitis without significant improvement over the last 17 days. There is a persistent peridiverticular abscess. No evidence of obstruction. 2. No acute osseous findings or evidence of progressive multiple myeloma. Lytic L3 and right sacral lesions are unchanged. 3. Stable incidental findings including diffuse atherosclerosis, renal parapelvic cysts and prostatomegaly. 4. These results were called by telephone at the time of interpretation on 01/08/2015 at 2:45 pm to Dr. SAncil Linsey, who verbally acknowledged these results. Electronically Signed   By: WRichardean SaleM.D.   On:  01/08/2015 14:50   Nm Pet Image Restage (ps) Whole Body  12/22/2014  CLINICAL DATA:  Subsequent treatment strategy for multiple myeloma. EXAM: NUCLEAR MEDICINE PET WHOLE BODY TECHNIQUE: 11.9 mCi F-18 FDG was injected intravenously. Full-ring PET imaging was performed from the vertex to the feet after the radiotracer. CT data was obtained and used for attenuation correction and anatomic localization. FASTING BLOOD GLUCOSE:  Value:  86 mg/dl COMPARISON:  PET-CT 08/11/2014 and 03/16/2014. FINDINGS: NECK No hypermetabolic cervical lymph nodes are identified.There are no lesions of the pharyngeal mucosal space. There is stable mild symmetric prominence of activity within the submandibular glands bilaterally. CHEST There are no hypermetabolic mediastinal, hilar or axillary lymph nodes. Interval significant improvement in the inflammatory changes in the right upper lobe with associated hypermetabolic activity. There are some residual linear components without hypermetabolic activity. Within the right lower lobe, there is low-level hypermetabolic activity associated with subpleural ground-glass  density. This has an SUV max of 5.8 and appears inflammatory. No suspicious pulmonary findings. ABDOMEN/PELVIS There is no hypermetabolic activity within the liver, adrenal glands, spleen or pancreas. There is no hypermetabolic nodal activity. There is extensive sigmoid diverticulosis with increased wall thickening and associated hypermetabolic activity. There is a new small lenticular shaped fluid collection with air bubbles beneath the left anterior abdominal wall, measuring 5.9 x 1.7 cm on image 195. This demonstrates hyper metabolism and is suspicious for a small abscess. No free intraperitoneal air or evidence of bowel obstruction. Moderate enlargement of the prostate gland, diffuse atherosclerosis and bilateral renal cortical and parapelvic cysts are again noted. SKELETON There is decreased metabolic activity throughout the lumbar spine attributed to radiation therapy. There is mild mottled activity throughout the skeleton without focally increased activity to suggest residual active multiple myeloma. The right sacral lesion measures 2.3 cm on image 192 and has no residual hypermetabolic activity (SUV max 2.5). The L3 lesion appears unchanged in size without residual hypermetabolic activity. Right rib deformities are noted without abnormal metabolic activity. EXTREMITIES No hypermetabolic activity to suggest metastasis. IMPRESSION: 1. No evidence of residual metabolically active multiple myeloma in the axial or appendicular skeleton. The dominant lesions in the L3 vertebral body and right sacrum have not changed in size. 2. No evidence of extra osseous neoplasm. 3. Interval improvement in inflammatory changes in the right lung, possibly related to previous radiation therapy. 4. New hypermetabolism in the sigmoid colon associated with changes of probable diverticulitis and a small peridiverticular abscess. Bowel rest and antibiotic therapy recommended. 5. These results will be called to the ordering clinician  or representative by the Radiologist Assistant, and communication documented in the PACS or zVision Dashboard. Electronically Signed   By: Richardean Sale M.D.   On: 12/22/2014 11:11   Ct Image Guided Drainage By Percutaneous Catheter  01/09/2015  CLINICAL DATA:  Diverticular abscess EXAM: CT GUIDED DRAINAGE OF LEFT LOWER QUADRANT PERITONEAL ABSCESS ANESTHESIA/SEDATION: Intravenous Fentanyl and Versed were administered as conscious sedation during continuous cardiorespiratory monitoring by the radiology RN, with a total moderate sedation time of 11 minutes. PROCEDURE: The procedure, risks, benefits, and alternatives were explained to the patient. Questions regarding the procedure were encouraged and answered. The patient understands and consents to the procedure. patient placed supine. select axial scans through the lower abdomen were obtained. the left lower quadrant extraluminal gas and fluid collection was localized and an appropriate skin entry site was determined and marked. The operative field was prepped with Betadinein a sterile fashion, and a sterile drape was applied covering the  operative field. A sterile gown and sterile gloves were used for the procedure. Local anesthesia was provided with 1% Lidocaine. Under CT fluoroscopic guidance, a 19 gauge percutaneous entry needle was advanced into the superficial left lower quadrant peritoneal collection. Purulent material could be aspirated. An Amplatz guidewire advanced easily within the collection, its position confirmed on CT fluoroscopy. The tract was dilated to facilitate placement of a 12 French pigtail catheter, formed centrally within the collection. 5 mL of purulent aspirate were removed and sent for Gram stain, culture and sensitivity. The catheter was secured externally with 0 Prolene suture and StatLock and placed to gravity drain bag. The patient tolerated the procedure well. COMPLICATIONS: None immediate FINDINGS: The left lower quadrant  superficial complex peritoneal collection measured approximately 6.1 cm maximum transverse diameter was again localized. Percutaneous drain catheter placed as above. IMPRESSION: 1. Technically successful CT-guided percutaneous peritoneal abscess drain catheter placement. Sample of the aspirate sent for Gram stain, culture and sensitivity. Electronically Signed   By: Lucrezia Europe M.D.   On: 01/09/2015 15:11    CBC  Recent Labs Lab 01/08/15 1707 01/09/15 0425 01/10/15 0410 01/11/15 0353 01/13/15 0408 01/14/15 0426  WBC 7.3 5.8 4.0 4.8 1.9* 5.6  HGB 14.0 12.3* 11.6* 12.1* 11.7* 11.3*  HCT 41.5 37.1* 35.4* 36.2* 35.3* 33.7*  PLT 91* 72* 82* 95* 115* 131*  MCV 92.6 94.4 93.7 91.9 91.0 90.8  MCH 31.3 31.3 30.7 30.7 30.2 30.5  MCHC 33.7 33.2 32.8 33.4 33.1 33.5  RDW 16.5* 16.4* 16.2* 15.8* 15.5 15.8*  LYMPHSABS 0.8 0.6*  --   --   --  0.6*  MONOABS 1.0 1.0  --   --   --  0.6  EOSABS 0.0 0.1  --   --   --  0.0  BASOSABS 0.0 0.0  --   --   --  0.0    Chemistries   Recent Labs Lab 01/08/15 1707 01/09/15 0425 01/10/15 0410 01/13/15 0408 01/14/15 0426  NA 136 136 137 141 144  K 4.1 3.7 3.6 3.8 3.2*  CL 102 106 110 115* 115*  CO2 25 26 22 22 22   GLUCOSE 95 115* 101* 207* 148*  BUN 17 15 11 6 8   CREATININE 0.81 0.66 0.68 0.51* 0.53*  CALCIUM 8.2* 7.6* 7.4* 7.2* 7.2*  AST  --  11*  --   --  14*  ALT  --  11*  --   --  11*  ALKPHOS  --  17*  --   --  17*  BILITOT  --  0.9  --   --  0.6   ------------------------------------------------------------------------------------------------------------------ estimated creatinine clearance is 88.8 mL/min (by C-G formula based on Cr of 0.53). ------------------------------------------------------------------------------------------------------------------ No results for input(s): HGBA1C in the last 72 hours. ------------------------------------------------------------------------------------------------------------------ No results for  input(s): CHOL, HDL, LDLCALC, TRIG, CHOLHDL, LDLDIRECT in the last 72 hours. ------------------------------------------------------------------------------------------------------------------ No results for input(s): TSH, T4TOTAL, T3FREE, THYROIDAB in the last 72 hours.  Invalid input(s): FREET3 ------------------------------------------------------------------------------------------------------------------ No results for input(s): VITAMINB12, FOLATE, FERRITIN, TIBC, IRON, RETICCTPCT in the last 72 hours.  Coagulation profile  Recent Labs Lab 01/09/15 1000  INR 1.28    No results for input(s): DDIMER in the last 72 hours.  Cardiac Enzymes No results for input(s): CKMB, TROPONINI, MYOGLOBIN in the last 168 hours.  Invalid input(s): CK ------------------------------------------------------------------------------------------------------------------ Invalid input(s): POCBNP    Verneita Griffes, MD [facility] [phone]
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
## 2015-01-15 ENCOUNTER — Other Ambulatory Visit: Payer: Self-pay | Admitting: Radiology

## 2015-01-15 DIAGNOSIS — K5720 Diverticulitis of large intestine with perforation and abscess without bleeding: Secondary | ICD-10-CM

## 2015-01-15 NOTE — Discharge Summary (Signed)
Physician Discharge Summary  Dean [name] ATF:[id number] DOB: 02/17/1935 DOA: 01/08/2015  PCP: [name], MD  Admit date: 01/08/2015 Discharge date: 01/15/2015  Time spent: 25 minutes  Recommendations for Outpatient Follow-up:   1. Needs drain follow up as OP with IR 2. Needs Gen surgery follow-up to be arranged by surgery for consideration of interval colectomy  3. Consider CBC + Bmet 1 week 4. Follow up with Oncology as OP    Discharge Diagnoses:  Principal Problem:   Colonic diverticular abscess Active Problems:   Multiple myeloma without remission ([place])   Thrombocytopenia ([place])   Intra-abdominal abscess [facility])   Discharge Condition: fair  Diet recommendation:  regular  Filed Weights   01/08/15 1651 01/08/15 2013  Weight: 100.699 kg (222 lb) 100.4 kg (221 lb 5.5 oz)    History of present illness:    79 y/o male with MM admitted with Diverticulitis Failed OP management with PO abx Admitted 01/08/15   Hospital Course:   Acute sigmoid diverticulitis with enlarging diverticular abscess -Noted on PET scan done approximately 2 weeks ago. Patient failed outpatient treatment with ciprofloxacin and Flagyl -CT abdomen = sigmoid diverticulitis without significant improvement over the last 17 days, persistent peridiverticular abscess -CT scan from 12/10 shows resolution of abscess -grad diet as per Gen surgery -transition IV Zosyn 12.5--->Augmentin 01/14/15--would complete 10 days total ending 01/18/15 -Gen. surgery consulted and appreciated. IR performed perc drain in [facility] Ct guided on 12/6 -drain to be follwed as OP-contacted IR and they will contact patient  Multiple myeloma -Revlimid held.  -Patient follows with Dr. Whitney Muse, oncologist  Thrombocytopenia -Likely secondary to chemo -will continue to monitor CBC  Leucopenia Monitor Blood counts are stable reassess  Discharge Exam: Filed Vitals:   01/14/15 2111 01/15/15 0612  BP: 89/73 110/63   Pulse: 64 69  Temp: 98 F (36.7 C) 98.4 F (36.9 C)  Resp: 18 17   Alert pleasant oriente din nad General:  Awake alert in no pain.  No n/v and tol full diet Cardiovascular: s1 s 2no m/r/g Respiratory: clear no added sound abd soft nt nd no rebond  Discharge Instructions   Discharge Instructions    Diet - low sodium heart healthy    Complete by:  As directed      Discharge instructions    Complete by:  As directed   Complete Augmentin on 01/18/15 Get lab work at primary [place] office ~ 1 week Your drain will need to be followed by [facility]-we will tryo to arrange this follow up You will need to see a surgeon-call their office and schedule an appointment     Increase activity slowly    Complete by:  As directed           Current Discharge Medication List    START taking these medications   Details  amoxicillin-clavulanate (AUGMENTIN) 875-125 MG tablet Take 1 tablet by mouth 2 (two) times daily. Qty: 14 tablet, Refills: 1      CONTINUE these medications which have NOT CHANGED   Details  acyclovir (ZOVIRAX) 400 MG tablet Take 1 tablet (400 mg total) by mouth 2 (two) times daily. Qty: 60 tablet, Refills: 5   Associated Diagnoses: Multiple myeloma without remission (HCC)    ALPRAZolam (XANAX) 1 MG tablet Take 1-2 tablets (1-2 mg total) by mouth at bedtime as needed for sleep. For anxiety and/or sleep Qty: 60 tablet, Refills: 1   Associated Diagnoses: Insomnia    aspirin EC 325 MG tablet Take  1 tablet (325 mg total) by mouth daily. Qty: 30 tablet, Refills: 11   Associated Diagnoses: Multiple myeloma without remission ([place]); Multiple myeloma (HCC)    Calcium Carb-Cholecalciferol (CALCIUM-VITAMIN D3) 600-500 MG-UNIT CAPS Take 1 tablet by mouth 2 (two) times daily. With food Qty: 60 capsule, Refills: 6   Associated Diagnoses: Multiple myeloma (HCC)    clotrimazole (LOTRIMIN) 1 % cream Apply 1 application topically 2 (two) times daily. To ear Qty: 45 g, Refills: 2     dexamethasone (DECADRON) 4 MG tablet Take 10 tablets (39m total) on Days 1, 8, & 15 of chemo. Qty: 70 tablet, Refills: 3   Associated Diagnoses: Multiple myeloma without remission ([place]; Multiple myeloma not having achieved remission (HCC)    diphenhydrAMINE (BENADRYL) 25 MG tablet Take 25 mg by mouth at bedtime as needed for sleep.    gabapentin (NEURONTIN) 400 MG capsule Take 1 capsule (400 mg total) by mouth 4 (four) times daily. Qty: 120 capsule, Refills: 2   Associated Diagnoses: Multiple myeloma without remission ([place]; Neuropathy (HCC)    omeprazole (PRILOSEC) 20 MG capsule Take 1 capsule (20 mg total) by mouth daily. Qty: 30 capsule, Refills: 5   Associated Diagnoses: Gastroesophageal reflux disease, esophagitis presence not specified    ondansetron (ZOFRAN) 8 MG tablet Take 1 tablet (8 mg total) by mouth every 8 (eight) hours as needed for nausea or vomiting. Qty: 30 tablet, Refills: 2   Associated Diagnoses: Multiple myeloma ([place]; Multiple myeloma without remission (HCC)    oxyCODONE-acetaminophen (PERCOCET) 10-325 MG tablet Take 1 tablet by mouth every 4 (four) hours as needed for pain. Qty: 60 tablet, Refills: 0   Associated Diagnoses: Low back pain without sciatica, unspecified back pain laterality; Multiple myeloma without remission (HCC)    tamsulosin (FLOMAX) 0.4 MG CAPS capsule Take 1 capsule (0.4 mg total) by mouth at bedtime. Qty: 30 capsule, Refills: 3    Zoledronic Acid (ZOMETA IV) Inject into the vein. Every 28 days   Associated Diagnoses: Multiple myeloma without remission ([place]; Multiple myeloma (HCC)    cyclobenzaprine (FLEXERIL) 10 MG tablet Take 1 tablet (10 mg total) by mouth 3 (three) times daily as needed for muscle spasms. Qty: 30 tablet, Refills: 0    doxepin (SINEQUAN) 10 MG capsule Take one at night for sleep Qty: 30 capsule, Refills: 3    Doxepin HCl (SILENOR) 6 MG TABS Take 1 tablet (6 mg total) by mouth at bedtime. Qty: 30 tablet, Refills: 6    Associated Diagnoses: Multiple myeloma without remission ([place]; Multiple myeloma (HCC)      STOP taking these medications     lenalidomide (REVLIMID) 10 MG capsule      PRESCRIPTION MEDICATION      ciprofloxacin (CIPRO) 500 MG tablet      metroNIDAZOLE (FLAGYL) 500 MG tablet        Allergies  Allergen Reactions  . Codeine Other (See Comments)    Severe headache  . Morphine And Related Other (See Comments)    Makes him feel weird   Follow-up Information    Follow up with ROSENBOWER,TODD J, MD. Schedule an appointment as soon as possible for a visit in 2 weeks.   Specialty:  General Surgery   Why:  To follow up after your operation, To follow up after your hospital stay, To have your drain evaluated & possibly removed   Contact information:   [address] [place] [phone]       The results of  significant diagnostics from this hospitalization (including imaging, microbiology, ancillary and laboratory) are listed below for reference.    Significant Diagnostic Studies: Ct Abdomen Pelvis W Contrast  01/13/2015  CLINICAL DATA:  Sigmoid diverticulitis with colonic diverticular abscess EXAM: CT ABDOMEN AND PELVIS WITH CONTRAST TECHNIQUE: Multidetector CT imaging of the abdomen and pelvis was performed using the standard protocol following bolus administration of intravenous contrast. CONTRAST:  39m OMNIPAQUE IOHEXOL 300 MG/ML SOLN, 104mOMNIPAQUE IOHEXOL 300 MG/ML SOLN COMPARISON:  01/09/2015 and 01/08/2015 FINDINGS: Sagittal images of the spine shows osteopenia and degenerative changes thoracolumbar spine. There is small right pleural effusion with right base posterior atelectasis. Tiny calcified granuloma right base posteriorly. Lytic lesion in L3 vertebral body and sacrum are without change. Enhanced liver, pancreas, spleen and adrenal glands are unremarkable. Left renal cortical and parapelvic cysts are stable. No hydronephrosis or hydroureter.  Delayed renal images shows bilateral renal symmetrical excretion. Bilateral visualized proximal ureter is unremarkable. There is no small bowel obstruction. Small umbilical hernia containing fat without evidence of acute complication. No pericecal inflammation. Normal appendix. The terminal ileum is unremarkable. Again noted scattered diverticula in descending colon. Multiple sigmoid colon diverticula are noted. There is a percutaneous drain in left lower abdomen. The fluid collection inferior to fascia left rectus muscle is resolved. No any residual fluid. Again noted mild inflammatory changes surrounding proximal sigmoid colon. Small amount of extraluminal air and tiny amount of fluid just anterior to sigmoid colon consistent with contained perforation again noted. No extraluminal contrast material. No definite mesenteric abscess. Again noted enlarged prostate gland with indentation of urinary bladder base. On there is no evidence of free abdominal air. No significant adenopathy. No inguinal adenopathy. IMPRESSION: 1. There is a percutaneous left abdominal drain with the tip under left abdominal wall at the site of previous the previous fluid collection which has resolved. No any residual abscess or fluid. 2. Again noted sigmoid colon multiple diverticula. Persistent mild inflammatory changes just anterior to sigmoid colon, tiny amount of air bubbles/extraluminal air and small amount of fluid consistent with contained perforation please see axial image 60. There is no evidence of confluent abscess. No new mesenteric abscess. 3. No pericecal inflammation.  Normal appendix. 4. No small bowel obstruction. 5. No hydronephrosis or hydroureter.  Stable left renal cysts. Electronically Signed   By: LiLahoma Crocker.D.   On: 01/13/2015 14:56   Ct Abdomen Pelvis W Contrast  01/08/2015  CLINICAL DATA:  Follow up diverticulitis. History of multiple myeloma. EXAM: CT ABDOMEN AND PELVIS WITH CONTRAST TECHNIQUE: Multidetector CT  imaging of the abdomen and pelvis was performed using the standard protocol following bolus administration of intravenous contrast. CONTRAST:  10060mMNIPAQUE IOHEXOL 300 MG/ML  SOLN COMPARISON:  PET-CT 08/11/2014 and 12/22/2014. FINDINGS: Lower chest: There are no significant residual findings at the visualized lung bases. There is mild linear right lower lobe scarring. Aortic and coronary artery atherosclerosis noted. Hepatobiliary: The liver is normal in density without focal abnormality. No significant biliary dilatation status post cholecystectomy. Pancreas: Unremarkable. No pancreatic ductal dilatation or surrounding inflammatory changes. Spleen: Normal in size without focal abnormality. Adrenals/Urinary Tract: Both adrenal glands appear normal. Grossly stable left renal cortical cysts and parapelvic cysts bilaterally. No hydronephrosis or urinary tract calculus. The bladder appears unremarkable. Stomach/Bowel: The stomach, small bowel, proximal colon and appendix demonstrate no significant findings. Diverticular changes are again noted throughout the distal colon. There are persistent inflammatory changes surrounding the sigmoid colon with small extraluminal air bubbles and surrounding inflammatory change.  There is a fluid collection beneath the anterior abdominal wall which contains air bubbles and measures up to 6.3 x 2.4 cm on image 74. This has mildly enlarged compared with the prior study. There is no free intraperitoneal air or evidence of bowel obstruction. Vascular/Lymphatic: There are no enlarged abdominal or pelvic lymph nodes. Fairly extensive atherosclerosis of the aorta, its branches and the iliac arteries. No evidence of large vessel occlusion. Reproductive: Stable moderate enlargement of the prostate gland without apparent focal abnormality. Other: Stable small umbilical hernia containing only fat. Musculoskeletal: The lytic lesions in the left aspect of the L3 vertebral body and the right  aspect of the sacrum are unchanged. The latter measures 2.2 cm on image 67. L4 superior endplate compression deformity appears unchanged. No new lytic lesions or pathologic fracture. IMPRESSION: 1. Persistent changes of sigmoid diverticulitis without significant improvement over the last 17 days. There is a persistent peridiverticular abscess. No evidence of obstruction. 2. No acute osseous findings or evidence of progressive multiple myeloma. Lytic L3 and right sacral lesions are unchanged. 3. Stable incidental findings including diffuse atherosclerosis, renal parapelvic cysts and prostatomegaly. 4. These results were called by telephone at the time of interpretation on 01/08/2015 at 2:45 pm to Dr. Ancil Linsey , who verbally acknowledged these results. Electronically Signed   By: Richardean Sale M.D.   On: 01/08/2015 14:50   Nm Pet Image Restage (ps) Whole Body  12/22/2014  CLINICAL DATA:  Subsequent treatment strategy for multiple myeloma. EXAM: NUCLEAR MEDICINE PET WHOLE BODY TECHNIQUE: 11.9 mCi F-18 FDG was injected intravenously. Full-ring PET imaging was performed from the vertex to the feet after the radiotracer. CT data was obtained and used for attenuation correction and anatomic localization. FASTING BLOOD GLUCOSE:  Value:  86 mg/dl COMPARISON:  PET-CT 08/11/2014 and 03/16/2014. FINDINGS: NECK No hypermetabolic cervical lymph nodes are identified.There are no lesions of the pharyngeal mucosal space. There is stable mild symmetric prominence of activity within the submandibular glands bilaterally. CHEST There are no hypermetabolic mediastinal, hilar or axillary lymph nodes. Interval significant improvement in the inflammatory changes in the right upper lobe with associated hypermetabolic activity. There are some residual linear components without hypermetabolic activity. Within the right lower lobe, there is low-level hypermetabolic activity associated with subpleural ground-glass density. This has  an SUV max of 5.8 and appears inflammatory. No suspicious pulmonary findings. ABDOMEN/PELVIS There is no hypermetabolic activity within the liver, adrenal glands, spleen or pancreas. There is no hypermetabolic nodal activity. There is extensive sigmoid diverticulosis with increased wall thickening and associated hypermetabolic activity. There is a new small lenticular shaped fluid collection with air bubbles beneath the left anterior abdominal wall, measuring 5.9 x 1.7 cm on image 195. This demonstrates hyper metabolism and is suspicious for a small abscess. No free intraperitoneal air or evidence of bowel obstruction. Moderate enlargement of the prostate gland, diffuse atherosclerosis and bilateral renal cortical and parapelvic cysts are again noted. SKELETON There is decreased metabolic activity throughout the lumbar spine attributed to radiation therapy. There is mild mottled activity throughout the skeleton without focally increased activity to suggest residual active multiple myeloma. The right sacral lesion measures 2.3 cm on image 192 and has no residual hypermetabolic activity (SUV max 2.5). The L3 lesion appears unchanged in size without residual hypermetabolic activity. Right rib deformities are noted without abnormal metabolic activity. EXTREMITIES No hypermetabolic activity to suggest metastasis. IMPRESSION: 1. No evidence of residual metabolically active multiple myeloma in the axial or appendicular skeleton. The dominant lesions  in the L3 vertebral body and right sacrum have not changed in size. 2. No evidence of extra osseous neoplasm. 3. Interval improvement in inflammatory changes in the right lung, possibly related to previous radiation therapy. 4. New hypermetabolism in the sigmoid colon associated with changes of probable diverticulitis and a small peridiverticular abscess. Bowel rest and antibiotic therapy recommended. 5. These results will be called to the ordering clinician or representative  by the Radiologist Assistant, and communication documented in the PACS or zVision Dashboard. Electronically Signed   By: Richardean Sale M.D.   On: 12/22/2014 11:11   Ct Image Guided Drainage By Percutaneous Catheter  01/09/2015  CLINICAL DATA:  Diverticular abscess EXAM: CT GUIDED DRAINAGE OF LEFT LOWER QUADRANT PERITONEAL ABSCESS ANESTHESIA/SEDATION: Intravenous Fentanyl and Versed were administered as conscious sedation during continuous cardiorespiratory monitoring by the radiology RN, with a total moderate sedation time of 11 minutes. PROCEDURE: The procedure, risks, benefits, and alternatives were explained to the patient. Questions regarding the procedure were encouraged and answered. The patient understands and consents to the procedure. patient placed supine. select axial scans through the lower abdomen were obtained. the left lower quadrant extraluminal gas and fluid collection was localized and an appropriate skin entry site was determined and marked. The operative field was prepped with Betadinein a sterile fashion, and a sterile drape was applied covering the operative field. A sterile gown and sterile gloves were used for the procedure. Local anesthesia was provided with 1% Lidocaine. Under CT fluoroscopic guidance, a 19 gauge percutaneous entry needle was advanced into the superficial left lower quadrant peritoneal collection. Purulent material could be aspirated. An Amplatz guidewire advanced easily within the collection, its position confirmed on CT fluoroscopy. The tract was dilated to facilitate placement of a 12 French pigtail catheter, formed centrally within the collection. 5 mL of purulent aspirate were removed and sent for Gram stain, culture and sensitivity. The catheter was secured externally with 0 Prolene suture and StatLock and placed to gravity drain bag. The patient tolerated the procedure well. COMPLICATIONS: None immediate FINDINGS: The left lower quadrant superficial complex  peritoneal collection measured approximately 6.1 cm maximum transverse diameter was again localized. Percutaneous drain catheter placed as above. IMPRESSION: 1. Technically successful CT-guided percutaneous peritoneal abscess drain catheter placement. Sample of the aspirate sent for Gram stain, culture and sensitivity. Electronically Signed   By: Lucrezia Europe M.D.   On: 01/09/2015 15:11    Microbiology: Recent Results (from the past 240 hour(s))  Culture, routine-abscess     Status: None   Collection Time: 01/09/15 12:24 PM  Result Value Ref Range Status   Specimen Description DRAINAGE  Final   Special Requests NONE  Final   Gram Stain   Final    ABUNDANT WBC PRESENT,BOTH PMN AND MONONUCLEAR NO SQUAMOUS EPITHELIAL CELLS SEEN ABUNDANT GRAM POSITIVE RODS MODERATE GRAM POSITIVE COCCI IN PAIRS    Culture   Final    MULTIPLE ORGANISMS PRESENT, NONE PREDOMINANT Note: NO STAPHYLOCOCCUS AUREUS ISOLATED NO GROUP A STREP (S.PYOGENES) ISOLATED Performed at [facility]    Report Status 01/13/2015 FINAL  Final     Labs: Basic Metabolic Panel:  Recent Labs Lab 01/08/15 1707 01/09/15 0425 01/10/15 0410 01/13/15 0408 01/14/15 0426  NA 136 136 137 141 144  K 4.1 3.7 3.6 3.8 3.2*  CL 102 106 110 115* 115*  CO2 _0 GLUCOSE 95 115* 101* 207* 148*  BUN _1 CREATININE 0.81 0.66 0.68  0.51* 0.53*  CALCIUM 8.2* 7.6* 7.4* 7.2* 7.2*   Liver Function Tests:  Recent Labs Lab 01/09/15 0425 01/14/15 0426  AST 11* 14*  ALT 11* 11*  ALKPHOS 17* 17*  BILITOT 0.9 0.6  PROT 4.6* 4.5*  ALBUMIN 2.1* 2.3*   No results for input(s): LIPASE, AMYLASE in the last 168 hours. No results for input(s): AMMONIA in the last 168 hours. CBC:  Recent Labs Lab 01/08/15 1707 01/09/15 0425 01/10/15 0410 01/11/15 0353 01/13/15 0408 01/14/15 0426  WBC 7.3 5.8 4.0 4.8 1.9* 5.6  NEUTROABS 5.4 4.1  --   --   --  4.4  HGB 14.0 12.3* 11.6* 12.1* 11.7* 11.3*  HCT 41.5 37.1*  35.4* 36.2* 35.3* 33.7*  MCV 92.6 94.4 93.7 91.9 91.0 90.8  PLT 91* 72* 82* 95* 115* 131*   Cardiac Enzymes: No results for input(s): CKTOTAL, CKMB, CKMBINDEX, TROPONINI in the last 168 hours. BNP: BNP (last 3 results) No results for input(s): BNP in the last 8760 hours.  ProBNP (last 3 results) No results for input(s): PROBNP in the last 8760 hours.  CBG: No results for input(s): GLUCAP in the last 168 hours.     SignedNita Sells  [facility] 01/15/2015, 8:07 AM

## 2015-01-15 NOTE — Care Management Note (Signed)
Case Management Note  Patient Details  Name: Dean [name] MRN: [id number] Date of Birth: 22-Oct-1935  Subjective/Objective:     79 yo admitted with Colonic diverticular abscess               Action/Plan: From home with wife  Expected Discharge Date:   (unknown)               Expected Discharge Plan:  [place]  In-House Referral:     Discharge planning Services  CM Consult  Post Acute Care Choice:  Home Health Choice offered to:  Patient, Adult Children, Spouse  DME Arranged:    DME Agency:     HH Arranged:  RN [place] Agency:  [place]  Status of Service:  Completed, signed off  Medicare Important Message Given:  Yes Date Medicare IM Given:    Medicare IM give by:    Date Additional Medicare IM Given:    Additional Medicare Important Message give by:     If discussed at [place] of Stay Meetings, dates discussed:    Additional Comments: HHRN orders for drain flushes at home. This CM met with pt and family at bedside to offer choice for [facility] services. [facility] was chosen. This CM explained to pt and family that staff RN would teach flushing of drain prior to discharge and [facility] would reinforce teaching at home. It was explained that [facility] would not come everyday. Pt states he understands. Lynnell Catalan, RN 01/15/2015, 9:47 AM

## 2015-01-15 NOTE — Discharge Instructions (Signed)
Drain Care -empty drain daily and record output amount. -flush drain twice a day with 10cc of normal saline  Diverticulitis Diverticulitis is when small pockets that have formed in your colon (large intestine) become infected or swollen. HOME CARE  Follow your doctor's instructions.  Follow a special diet if told by your doctor.  When you feel better, your doctor may tell you to change your diet. You may be told to eat a lot of fiber. Fruits and vegetables are good sources of fiber. Fiber makes it easier to poop (have bowel movements).  Take supplements or probiotics as told by your doctor.  Only take medicines as told by your doctor.  Keep all follow-up visits with your doctor. GET HELP IF:  Your pain does not get better.  You have a hard time eating food.  You are not pooping like normal. GET HELP RIGHT AWAY IF:  Your pain gets worse.  Your problems do not get better.  Your problems suddenly get worse.  You have a fever.  You keep throwing up (vomiting).  You have bloody or black, tarry poop (stool). MAKE SURE YOU:   Understand these instructions.  Will watch your condition.  Will get help right away if you are not doing well or get worse.   This information is not intended to replace advice given to you by your health care provider. Make sure you discuss any questions you have with your health care provider.   Document Released: 07/09/2007 Document Revised: 01/25/2013 Document Reviewed: 12/15/2012 Elsevier Interactive Patient Education 2016 [place]. Irregular bowel habits such as constipation and diarrhea can lead to many problems over time.  Having one soft bowel movement a day is the most important way to prevent further problems.  The anorectal canal is designed to handle stretching and feces to safely manage our ability to get rid of solid waste (feces, poop, stool) out of our body.  BUT, hard constipated stools can  act like ripping concrete bricks and diarrhea can be a burning fire to this very sensitive area of our body, causing inflamed hemorrhoids, anal fissures, increasing risk is perirectal abscesses, abdominal pain/bloating, an making irritable bowel worse.      The goal: ONE SOFT BOWEL MOVEMENT A DAY!  To have soft, regular bowel movements:   Drink plenty of fluids, consider 4-6 tall glasses of water a day.    Take plenty of fiber.  Fiber is the undigested part of plant food that passes into the colon, acting s natures broom to encourage bowel motility and movement.  Fiber can absorb and hold large amounts of water. This results in a larger, bulkier stool, which is soft and easier to pass. Work gradually over several weeks up to 6 servings a day of fiber (25g a day even more if needed) in the form of: o Vegetables -- Root (potatoes, carrots, turnips), leafy green (lettuce, salad greens, celery, spinach), or cooked high residue (cabbage, broccoli, etc) o Fruit -- Fresh (unpeeled skin & pulp), Dried (prunes, apricots, cherries, etc ),  or stewed ( applesauce)  o Whole grain breads, pasta, etc (whole wheat)  o Bran cereals   Bulking Agents -- This type of water-retaining fiber generally is easily obtained each day by one of the following:  o Psyllium bran -- The psyllium plant is remarkable because its ground seeds can retain so much water. This product is available as Metamucil, Konsyl, Effersyllium, Per Diem Fiber, or the less  expensive generic preparation in drug and health food stores. Although labeled a laxative, it really is not a laxative.  o Methylcellulose -- This is another fiber derived from wood which also retains water. It is available as Citrucel. o Polyethylene Glycol - and artificial fiber commonly called Miralax or Glycolax.  It is helpful for people with gassy or bloated feelings with regular fiber o Flax Seed - a less gassy fiber than psyllium  No reading or other relaxing activity  while on the toilet. If bowel movements take longer than 5 minutes, you are too constipated  AVOID CONSTIPATION.  High fiber and water intake usually takes care of this.  Sometimes a laxative is needed to stimulate more frequent bowel movements, but   Laxatives are not a good long-term solution as it can wear the colon out.  They can help jump-start bowels if constipated, but should be relied on constantly without discussing with your doctor o Osmotics (Milk of Magnesia, Fleets phosphosoda, Magnesium citrate, MiraLax, GoLytely) are safer than  o Stimulants (Senokot, Castor Oil, Dulcolax, Ex Lax)    o Avoid taking laxatives for more than 7 days in a row.   IF SEVERELY CONSTIPATED, try a Bowel Retraining Program: o Do not use laxatives.  o Eat a diet high in roughage, such as bran cereals and leafy vegetables.  o Drink six (6) ounces of prune or apricot juice each morning.  o Eat two (2) large servings of stewed fruit each day.  o Take one (1) heaping tablespoon of a psyllium-based bulking agent twice a day. Use sugar-free sweetener when possible to avoid excessive calories.  o Eat a normal breakfast.  o Set aside 15 minutes after breakfast to sit on the toilet, but do not strain to have a bowel movement.  o If you do not have a bowel movement by the third day, use an enema and repeat the above steps.   Controlling diarrhea o Switch to liquids and simpler foods for a few days to avoid stressing your intestines further. o Avoid dairy products (especially milk & ice cream) for a short time.  The intestines often can lose the ability to digest lactose when stressed. o Avoid foods that cause gassiness or bloating.  Typical foods include beans and other legumes, cabbage, broccoli, and dairy foods.  Every person has some sensitivity to other foods, so listen to our body and avoid those foods that trigger problems for you. o Adding fiber (Citrucel, Metamucil, psyllium, Miralax) gradually can help  thicken stools by absorbing excess fluid and retrain the intestines to act more normally.  Slowly increase the dose over a few weeks.  Too much fiber too soon can backfire and cause cramping & bloating. o Probiotics (such as active yogurt, Align, etc) may help repopulate the intestines and colon with normal bacteria and calm down a sensitive digestive tract.  Most studies show it to be of mild help, though, and such products can be costly. o Medicines: - Bismuth subsalicylate (ex. Kayopectate, Pepto Bismol) every 30 minutes for up to 6 doses can help control diarrhea.  Avoid if pregnant. - Loperamide (Immodium) can slow down diarrhea.  Start with two tablets (4mg  total) first and then try one tablet every 6 hours.  Avoid if you are having fevers or severe pain.  If you are not better or start feeling worse, stop all medicines and call your doctor for advice o Call your doctor if you are getting worse or not better.  Sometimes further  testing (cultures, endoscopy, X-ray studies, bloodwork, etc) may be needed to help diagnose and treat the cause of the diarrhea.  TROUBLESHOOTING IRREGULAR BOWELS 1) Avoid extremes of bowel movements (no bad constipation/diarrhea) 2) Miralax 17gm mixed in 8oz. water or juice-daily. May use BID as needed.  3) Gas-x,Phazyme, etc. as needed for gas & bloating.  4) Soft,bland diet. No spicy,greasy,fried foods.  5) Prilosec over-the-counter as needed  6) May hold gluten/wheat products from diet to see if symptoms improve.  7)  May try probiotics (Align, Activa, etc) to help calm the bowels down 7) If symptoms become worse call back immediately.

## 2015-01-15 NOTE — Progress Notes (Signed)
Patient ID: Dean [name], male   DOB: 08-10-1935, 79 y.o.   MRN: [id number]    Subjective: Pt feels well this morning.  Tolerating soft diet with no issues.  No pain.  Objective: Vital signs in last 24 hours: Temp:  [98 F (36.7 C)-98.4 F (36.9 C)] 98.4 F (36.9 C) (12/12 0612) Pulse Rate:  [63-69] 69 (12/12 0612) Resp:  [17-18] 17 (12/12 0612) BP: (89-117)/(56-73) 110/63 mmHg (12/12 0612) SpO2:  [96 %-98 %] 96 % (12/12 0612) Last BM Date: 01/13/15  Intake/Output from previous day: 12/11 0701 - 12/12 0700 In: 1085 [P.O.:1080] Out: 1525 [Urine:1525] Intake/Output this shift:    PE: Abd: soft, NT, drain in place with tan/brown thin output, +BS  Lab Results:   Recent Labs  01/13/15 0408 01/14/15 0426  WBC 1.9* 5.6  HGB 11.7* 11.3*  HCT 35.3* 33.7*  PLT 115* 131*   BMET  Recent Labs  01/13/15 0408 01/14/15 0426  NA 141 144  K 3.8 3.2*  CL 115* 115*  CO2 22 22  GLUCOSE 207* 148*  BUN 6 8  CREATININE 0.51* 0.53*  CALCIUM 7.2* 7.2*   PT/INR No results for input(s): LABPROT, INR in the last 72 hours. CMP     Component Value Date/Time   NA 144 01/14/2015 0426   K 3.2* 01/14/2015 0426   CL 115* 01/14/2015 0426   CO2 22 01/14/2015 0426   GLUCOSE 148* 01/14/2015 0426   BUN 8 01/14/2015 0426   CREATININE 0.53* 01/14/2015 0426   CREATININE 0.76 02/06/2014 1249   CALCIUM 7.2* 01/14/2015 0426   PROT 4.5* 01/14/2015 0426   ALBUMIN 2.3* 01/14/2015 0426   AST 14* 01/14/2015 0426   ALT 11* 01/14/2015 0426   ALKPHOS 17* 01/14/2015 0426   BILITOT 0.6 01/14/2015 0426   GFRNONAA >60 01/14/2015 0426   GFRNONAA 89 02/06/2014 1249   GFRAA >60 01/14/2015 0426   GFRAA >89 02/06/2014 1249   Lipase  No results found for: LIPASE     Studies/Results: Ct Abdomen Pelvis W Contrast  01/13/2015  CLINICAL DATA:  Sigmoid diverticulitis with colonic diverticular abscess EXAM: CT ABDOMEN AND PELVIS WITH CONTRAST TECHNIQUE: Multidetector CT imaging of the abdomen and  pelvis was performed using the standard protocol following bolus administration of intravenous contrast. CONTRAST:  38mL OMNIPAQUE IOHEXOL 300 MG/ML SOLN, 128mL OMNIPAQUE IOHEXOL 300 MG/ML SOLN COMPARISON:  01/09/2015 and 01/08/2015 FINDINGS: Sagittal images of the spine shows osteopenia and degenerative changes thoracolumbar spine. There is small right pleural effusion with right base posterior atelectasis. Tiny calcified granuloma right base posteriorly. Lytic lesion in L3 vertebral body and sacrum are without change. Enhanced liver, pancreas, spleen and adrenal glands are unremarkable. Left renal cortical and parapelvic cysts are stable. No hydronephrosis or hydroureter. Delayed renal images shows bilateral renal symmetrical excretion. Bilateral visualized proximal ureter is unremarkable. There is no small bowel obstruction. Small umbilical hernia containing fat without evidence of acute complication. No pericecal inflammation. Normal appendix. The terminal ileum is unremarkable. Again noted scattered diverticula in descending colon. Multiple sigmoid colon diverticula are noted. There is a percutaneous drain in left lower abdomen. The fluid collection inferior to fascia left rectus muscle is resolved. No any residual fluid. Again noted mild inflammatory changes surrounding proximal sigmoid colon. Small amount of extraluminal air and tiny amount of fluid just anterior to sigmoid colon consistent with contained perforation again noted. No extraluminal contrast material. No definite mesenteric abscess. Again noted enlarged prostate gland with indentation of urinary bladder base. On  there is no evidence of free abdominal air. No significant adenopathy. No inguinal adenopathy. IMPRESSION: 1. There is a percutaneous left abdominal drain with the tip under left abdominal wall at the site of previous the previous fluid collection which has resolved. No any residual abscess or fluid. 2. Again noted sigmoid colon multiple  diverticula. Persistent mild inflammatory changes just anterior to sigmoid colon, tiny amount of air bubbles/extraluminal air and small amount of fluid consistent with contained perforation please see axial image 60. There is no evidence of confluent abscess. No new mesenteric abscess. 3. No pericecal inflammation.  Normal appendix. 4. No small bowel obstruction. 5. No hydronephrosis or hydroureter.  Stable left renal cysts. Electronically Signed   By: Lahoma Crocker M.D.   On: 01/13/2015 14:56    Anti-infectives: Anti-infectives    Start     Dose/Rate Route Frequency Ordered Stop   01/14/15 0000  amoxicillin-clavulanate (AUGMENTIN) 875-125 MG tablet     1 tablet Oral 2 times daily 01/14/15 1244     01/08/15 2200  piperacillin-tazobactam (ZOSYN) IVPB 3.375 g     3.375 g 12.5 mL/hr over 240 Minutes Intravenous 3 times per day 01/08/15 1804     01/08/15 2200  acyclovir (ZOVIRAX) tablet 400 mg     400 mg Oral 2 times daily 01/08/15 1925     01/08/15 1745  metroNIDAZOLE (FLAGYL) IVPB 1 g  Status:  Discontinued     1 g 200 mL/hr over 60 Minutes Intravenous  Once 01/08/15 1733 01/08/15 1804   01/08/15 1745  cefTRIAXone (ROCEPHIN) 1 g in dextrose 5 % 50 mL IVPB  Status:  Discontinued     1 g 100 mL/hr over 30 Minutes Intravenous  Once 01/08/15 1733 01/08/15 1804       Assessment/Plan   Acute sigmoid diverticulitis with diverticular abscess S/p perc drain -will need IR follow up in drain clinic in 1-2 weeks to inject drain prior to removal per Dr. Bertrum Sol wishes. -he will then need to follow up with Dr. Zella Richer in 2 weeks after drain study completed -HH being set up for drain flushes and care -recommend 7 more days of oral augmentin at home  LOS: 7 days    Hermie Reagor E 01/15/2015, 8:40 AM Pager: [phone]

## 2015-01-15 NOTE — Care Management Important Message (Signed)
Important Message  Patient Details  Name: Dean [name] MRN: [id number] Date of Birth: 13-Jan-1936   Medicare Important Message Given:  Yes    Camillo Flaming 01/15/2015, 12:32 [place] Message  Patient Details  Name: Dean [name] MRN: [id number] Date of Birth: 11/05/1935   Medicare Important Message Given:  Yes    Camillo Flaming 01/15/2015, 12:32 PM

## 2015-01-16 DIAGNOSIS — Z48815 Encounter for surgical aftercare following surgery on the digestive system: Secondary | ICD-10-CM | POA: Diagnosis not present

## 2015-01-16 DIAGNOSIS — D696 Thrombocytopenia, unspecified: Secondary | ICD-10-CM | POA: Diagnosis not present

## 2015-01-16 DIAGNOSIS — K651 Peritoneal abscess: Secondary | ICD-10-CM | POA: Diagnosis not present

## 2015-01-16 DIAGNOSIS — Z8579 Personal history of other malignant neoplasms of lymphoid, hematopoietic and related tissues: Secondary | ICD-10-CM | POA: Diagnosis not present

## 2015-01-16 DIAGNOSIS — K219 Gastro-esophageal reflux disease without esophagitis: Secondary | ICD-10-CM | POA: Diagnosis not present

## 2015-01-16 DIAGNOSIS — L989 Disorder of the skin and subcutaneous tissue, unspecified: Secondary | ICD-10-CM | POA: Diagnosis not present

## 2015-01-16 DIAGNOSIS — K5780 Diverticulitis of intestine, part unspecified, with perforation and abscess without bleeding: Secondary | ICD-10-CM | POA: Diagnosis not present

## 2015-01-16 DIAGNOSIS — G629 Polyneuropathy, unspecified: Secondary | ICD-10-CM | POA: Diagnosis not present

## 2015-01-17 ENCOUNTER — Other Ambulatory Visit (HOSPITAL_COMMUNITY): Payer: Self-pay | Admitting: Oncology

## 2015-01-17 ENCOUNTER — Telehealth: Payer: Self-pay | Admitting: Family Medicine

## 2015-01-17 DIAGNOSIS — K5780 Diverticulitis of intestine, part unspecified, with perforation and abscess without bleeding: Secondary | ICD-10-CM | POA: Diagnosis not present

## 2015-01-17 DIAGNOSIS — K219 Gastro-esophageal reflux disease without esophagitis: Secondary | ICD-10-CM | POA: Diagnosis not present

## 2015-01-17 DIAGNOSIS — C9000 Multiple myeloma not having achieved remission: Secondary | ICD-10-CM

## 2015-01-17 DIAGNOSIS — Z48815 Encounter for surgical aftercare following surgery on the digestive system: Secondary | ICD-10-CM | POA: Diagnosis not present

## 2015-01-17 DIAGNOSIS — G629 Polyneuropathy, unspecified: Secondary | ICD-10-CM | POA: Diagnosis not present

## 2015-01-17 DIAGNOSIS — D696 Thrombocytopenia, unspecified: Secondary | ICD-10-CM | POA: Diagnosis not present

## 2015-01-17 DIAGNOSIS — K651 Peritoneal abscess: Secondary | ICD-10-CM | POA: Diagnosis not present

## 2015-01-17 MED ORDER — LENALIDOMIDE 10 MG PO CAPS: 10.0000 mg | ORAL_CAPSULE | ORAL | Status: AC

## 2015-01-17 MED ORDER — UNABLE TO FIND: Status: DC

## 2015-01-17 NOTE — Telephone Encounter (Signed)
[facility] calling regarding this patient  Please call her back at [phone]

## 2015-01-17 NOTE — Telephone Encounter (Signed)
Pt was sent home with drain and is needing saline flushes - flushes called to pharm

## 2015-01-18 DIAGNOSIS — Z48815 Encounter for surgical aftercare following surgery on the digestive system: Secondary | ICD-10-CM | POA: Diagnosis not present

## 2015-01-18 DIAGNOSIS — K219 Gastro-esophageal reflux disease without esophagitis: Secondary | ICD-10-CM | POA: Diagnosis not present

## 2015-01-18 DIAGNOSIS — G629 Polyneuropathy, unspecified: Secondary | ICD-10-CM | POA: Diagnosis not present

## 2015-01-18 DIAGNOSIS — K5780 Diverticulitis of intestine, part unspecified, with perforation and abscess without bleeding: Secondary | ICD-10-CM | POA: Diagnosis not present

## 2015-01-18 DIAGNOSIS — D696 Thrombocytopenia, unspecified: Secondary | ICD-10-CM | POA: Diagnosis not present

## 2015-01-18 DIAGNOSIS — K651 Peritoneal abscess: Secondary | ICD-10-CM | POA: Diagnosis not present

## 2015-01-22 ENCOUNTER — Encounter (HOSPITAL_COMMUNITY): Payer: Medicare Other | Attending: Hematology and Oncology

## 2015-01-22 ENCOUNTER — Encounter (HOSPITAL_COMMUNITY): Payer: Medicare Other

## 2015-01-22 DIAGNOSIS — R630 Anorexia: Secondary | ICD-10-CM | POA: Insufficient documentation

## 2015-01-22 DIAGNOSIS — R634 Abnormal weight loss: Secondary | ICD-10-CM | POA: Insufficient documentation

## 2015-01-22 DIAGNOSIS — N400 Enlarged prostate without lower urinary tract symptoms: Secondary | ICD-10-CM | POA: Insufficient documentation

## 2015-01-22 DIAGNOSIS — D472 Monoclonal gammopathy: Secondary | ICD-10-CM | POA: Insufficient documentation

## 2015-01-22 DIAGNOSIS — M542 Cervicalgia: Secondary | ICD-10-CM | POA: Insufficient documentation

## 2015-01-22 DIAGNOSIS — C9000 Multiple myeloma not having achieved remission: Secondary | ICD-10-CM

## 2015-01-22 DIAGNOSIS — M899 Disorder of bone, unspecified: Secondary | ICD-10-CM | POA: Insufficient documentation

## 2015-01-22 DIAGNOSIS — Z923 Personal history of irradiation: Secondary | ICD-10-CM | POA: Insufficient documentation

## 2015-01-22 DIAGNOSIS — K219 Gastro-esophageal reflux disease without esophagitis: Secondary | ICD-10-CM | POA: Insufficient documentation

## 2015-01-22 DIAGNOSIS — E876 Hypokalemia: Secondary | ICD-10-CM

## 2015-01-22 MED ORDER — ZOLEDRONIC ACID 4 MG/5ML IV CONC: 4.0000 mg | Freq: Once | INTRAVENOUS | Status: DC

## 2015-01-22 MED ORDER — SODIUM CHLORIDE 0.9 % IV SOLN: Freq: Once | INTRAVENOUS | Status: DC

## 2015-01-22 MED ORDER — POTASSIUM CHLORIDE CRYS ER 20 MEQ PO TBCR: 40.0000 meq | EXTENDED_RELEASE_TABLET | Freq: Every day | ORAL | Status: DC

## 2015-01-22 NOTE — Progress Notes (Signed)
No labs needed today 

## 2015-01-22 NOTE — Progress Notes (Signed)
Patient not given Zometa today per Robynn Pane, PA.  Corrected calcium 8.6.  Return as scheduled.  Potassium level and leg cramps reported to [facility] [place] as well.  No new orders reported to me.

## 2015-01-23 ENCOUNTER — Ambulatory Visit (HOSPITAL_COMMUNITY)
Admission: RE | Admit: 2015-01-23 | Discharge: 2015-01-23 | Disposition: A | Discharge status: Home / Self Care | Payer: Medicare Other | Source: Ambulatory Visit | Attending: Interventional Radiology | Admitting: Interventional Radiology

## 2015-01-23 ENCOUNTER — Inpatient Hospital Stay (HOSPITAL_COMMUNITY): Admit: 2015-01-23 | Payer: Medicare Other

## 2015-01-23 ENCOUNTER — Other Ambulatory Visit (HOSPITAL_COMMUNITY): Payer: Self-pay | Admitting: Interventional Radiology

## 2015-01-23 ENCOUNTER — Ambulatory Visit (HOSPITAL_COMMUNITY): Admit: 2015-01-23 | Payer: Medicare Other

## 2015-01-23 DIAGNOSIS — Z4803 Encounter for change or removal of drains: Secondary | ICD-10-CM | POA: Diagnosis not present

## 2015-01-23 DIAGNOSIS — K5780 Diverticulitis of intestine, part unspecified, with perforation and abscess without bleeding: Secondary | ICD-10-CM | POA: Insufficient documentation

## 2015-01-23 DIAGNOSIS — L0291 Cutaneous abscess, unspecified: Secondary | ICD-10-CM

## 2015-01-23 DIAGNOSIS — K651 Peritoneal abscess: Secondary | ICD-10-CM | POA: Diagnosis not present

## 2015-01-23 MED ORDER — IOHEXOL 300 MG/ML  SOLN
50.0000 mL | Freq: Once | INTRAMUSCULAR | Status: AC | PRN
  Administered 2015-01-23: 10 mL

## 2015-01-25 DIAGNOSIS — G629 Polyneuropathy, unspecified: Secondary | ICD-10-CM | POA: Diagnosis not present

## 2015-01-25 DIAGNOSIS — K219 Gastro-esophageal reflux disease without esophagitis: Secondary | ICD-10-CM | POA: Diagnosis not present

## 2015-01-25 DIAGNOSIS — Z48815 Encounter for surgical aftercare following surgery on the digestive system: Secondary | ICD-10-CM | POA: Diagnosis not present

## 2015-01-25 DIAGNOSIS — D696 Thrombocytopenia, unspecified: Secondary | ICD-10-CM | POA: Diagnosis not present

## 2015-01-25 DIAGNOSIS — K651 Peritoneal abscess: Secondary | ICD-10-CM | POA: Diagnosis not present

## 2015-01-25 DIAGNOSIS — K5780 Diverticulitis of intestine, part unspecified, with perforation and abscess without bleeding: Secondary | ICD-10-CM | POA: Diagnosis not present

## 2015-02-01 DIAGNOSIS — Z48815 Encounter for surgical aftercare following surgery on the digestive system: Secondary | ICD-10-CM | POA: Diagnosis not present

## 2015-02-01 DIAGNOSIS — K219 Gastro-esophageal reflux disease without esophagitis: Secondary | ICD-10-CM | POA: Diagnosis not present

## 2015-02-01 DIAGNOSIS — K5780 Diverticulitis of intestine, part unspecified, with perforation and abscess without bleeding: Secondary | ICD-10-CM | POA: Diagnosis not present

## 2015-02-01 DIAGNOSIS — G629 Polyneuropathy, unspecified: Secondary | ICD-10-CM | POA: Diagnosis not present

## 2015-02-01 DIAGNOSIS — D696 Thrombocytopenia, unspecified: Secondary | ICD-10-CM | POA: Diagnosis not present

## 2015-02-01 DIAGNOSIS — K651 Peritoneal abscess: Secondary | ICD-10-CM | POA: Diagnosis not present

## 2015-02-14 ENCOUNTER — Other Ambulatory Visit (HOSPITAL_COMMUNITY): Payer: Self-pay | Admitting: *Deleted

## 2015-02-14 DIAGNOSIS — C9000 Multiple myeloma not having achieved remission: Secondary | ICD-10-CM

## 2015-02-14 MED ORDER — TAMSULOSIN HCL 0.4 MG PO CAPS: 0.4000 mg | ORAL_CAPSULE | Freq: Every day | ORAL | Status: DC

## 2015-02-14 MED ORDER — ACYCLOVIR 400 MG PO TABS: 400.0000 mg | ORAL_TABLET | Freq: Two times a day (BID) | ORAL | Status: DC

## 2015-02-15 DIAGNOSIS — Z48815 Encounter for surgical aftercare following surgery on the digestive system: Secondary | ICD-10-CM | POA: Diagnosis not present

## 2015-02-15 DIAGNOSIS — G629 Polyneuropathy, unspecified: Secondary | ICD-10-CM | POA: Diagnosis not present

## 2015-02-15 DIAGNOSIS — K219 Gastro-esophageal reflux disease without esophagitis: Secondary | ICD-10-CM | POA: Diagnosis not present

## 2015-02-15 DIAGNOSIS — K651 Peritoneal abscess: Secondary | ICD-10-CM | POA: Diagnosis not present

## 2015-02-15 DIAGNOSIS — D696 Thrombocytopenia, unspecified: Secondary | ICD-10-CM | POA: Diagnosis not present

## 2015-02-15 DIAGNOSIS — K5780 Diverticulitis of intestine, part unspecified, with perforation and abscess without bleeding: Secondary | ICD-10-CM | POA: Diagnosis not present

## 2015-02-16 ENCOUNTER — Ambulatory Visit (INDEPENDENT_AMBULATORY_CARE_PROVIDER_SITE_OTHER): Payer: Medicare Other | Admitting: Family Medicine

## 2015-02-16 ENCOUNTER — Telehealth: Payer: Self-pay | Admitting: *Deleted

## 2015-02-16 ENCOUNTER — Encounter: Payer: Self-pay | Admitting: Family Medicine

## 2015-02-16 VITALS — BP 110/76 | HR 78 | Temp 97.4°F | Resp 18 | Ht 69.0 in | Wt 228.0 lb

## 2015-02-16 DIAGNOSIS — K5732 Diverticulitis of large intestine without perforation or abscess without bleeding: Secondary | ICD-10-CM

## 2015-02-16 DIAGNOSIS — R600 Localized edema: Secondary | ICD-10-CM

## 2015-02-16 DIAGNOSIS — R635 Abnormal weight gain: Secondary | ICD-10-CM

## 2015-02-16 DIAGNOSIS — R609 Edema, unspecified: Secondary | ICD-10-CM | POA: Diagnosis not present

## 2015-02-16 DIAGNOSIS — C9000 Multiple myeloma not having achieved remission: Secondary | ICD-10-CM | POA: Diagnosis not present

## 2015-02-16 LAB — COMPLETE METABOLIC PANEL WITH GFR
ALT: 8 U/L — ABNORMAL LOW (ref 9–46)
AST: 14 U/L (ref 10–35)
Albumin: 3.3 g/dL — ABNORMAL LOW (ref 3.6–5.1)
Alkaline Phosphatase: 21 U/L — ABNORMAL LOW (ref 40–115)
BUN: 15 mg/dL (ref 7–25)
CO2: 27 mmol/L (ref 20–31)
Calcium: 9 mg/dL (ref 8.6–10.3)
Chloride: 105 mmol/L (ref 98–110)
Creat: 0.68 mg/dL — ABNORMAL LOW (ref 0.70–1.18)
GFR, Est African American: >89 mL/min (ref >=60)
GFR, Est Non African American: >89 mL/min (ref >=60)
Glucose, Bld: 84 mg/dL (ref 70–99)
Potassium: 4.3 mmol/L (ref 3.5–5.3)
Sodium: 144 mmol/L (ref 135–146)
Total Bilirubin: 0.5 mg/dL (ref 0.2–1.2)
Total Protein: 5.4 g/dL — ABNORMAL LOW (ref 6.1–8.1)

## 2015-02-16 LAB — CBC WITH DIFFERENTIAL/PLATELET
Basophils Absolute: 0 10*3/uL (ref 0.0–0.1)
Basophils Relative: 0 % (ref 0–1)
Eosinophils Absolute: 0.1 10*3/uL (ref 0.0–0.7)
Eosinophils Relative: 1 % (ref 0–5)
HCT: 40.5 % (ref 39.0–52.0)
Hemoglobin: 13.3 g/dL (ref 13.0–17.0)
Lymphocytes Relative: 24 % (ref 12–46)
Lymphs Abs: 1.3 10*3/uL (ref 0.7–4.0)
MCH: 30.3 pg (ref 26.0–34.0)
MCHC: 32.8 g/dL (ref 30.0–36.0)
MCV: 92.3 fL (ref 78.0–100.0)
MPV: 9.1 fL (ref 8.6–12.4)
Monocytes Absolute: 0.6 10*3/uL (ref 0.1–1.0)
Monocytes Relative: 12 % (ref 3–12)
Neutro Abs: 3.4 10*3/uL (ref 1.7–7.7)
Neutrophils Relative %: 63 % (ref 43–77)
Platelets: 149 10*3/uL — ABNORMAL LOW (ref 150–400)
RBC: 4.39 MIL/uL (ref 4.22–5.81)
RDW: 15.1 % (ref 11.5–15.5)
WBC: 5.4 10*3/uL (ref 4.0–10.5)

## 2015-02-16 MED ORDER — FUROSEMIDE 20 MG PO TABS
20.0000 mg | ORAL_TABLET | Freq: Every day | ORAL | Status: DC
Start: 2015-02-16 — End: 2016-10-13

## 2015-02-16 NOTE — Progress Notes (Signed)
Subjective:    Patient ID: Dean [name], male    DOB: Nov 18, 1935, 80 y.o.   MRN: [id number]  HPI 02/06/14  patient has had right-sided chest wall pain for several months. However since December has become more severe and almost constant. The pain is located in his right chest near his right nipple. It is made worse by coughing, deep inspiration, and movement. He denies any angina. He denies any shortness of breath. He denies any hemoptysis. He denies any fevers or chills or weight loss. He denies any relationship of the pain to food or fatty meals. He does still have his gallbladder. He denies any weight loss or jaundice. He denies any fever or night sweats. I am unable to reproduce the pain today with deep palpation of the right chest and ribs.  At that time, my plan was:  patient's diagnosis is uncertain. I do not believe that this is cardiac in nature. EKG obtained today in the office reveals normal sinus rhythm with normal intervals and normal axis. However the patient does have mild nonspecific ST changes particularly in the inferior and lateral leads. This being said, the patient's pain is not typical for angina or cardiac pain. It sounds musculoskeletal in the chest wall. Therefore I will begin with a chest x-ray as well as x-rays of the ribs. I'll also check a CBC as well as a CMP. Further plan will be dictated based upon the results of his lab work and chest x-ray.  02/16/15 I have not seen the patient since that time.  In the interval, he was diagnosed with multiple myeloma and has been seeing hem/onc for this as well.  Admitted to hospital 12/16 with diverticulitis requiring IV abx and percutaneous drainage by IR.  While he was in the hospital, his chemotherapy was temporarily discontinued. After discharge from the hospital, the patient states his weight dropped to 212 pounds. Over the last 10 days, his weight has increased 16 pounds up to 228. He reports mild shortness of breath. He denies  any chest pain. He denies any pleurisy. He denies any hemoptysis. He denies any cough. He does report peripheral edema. He states that recently his legs a been extremely swollen. Today he has trace bipedal edema. It is not marked on his exam today, but the patient states yesterday his legs were tense and tight with fluid. All this raises the concern about possible CHF. However he also reports severe fatigue which is worsened over the last few weeks. He denies any abdominal pain or vomiting or blood in his stool. Past Medical History  Diagnosis Date  . Neuropathy ([place])   . BPH (benign prostatic hyperplasia)   . GERD (gastroesophageal reflux disease)   . H/O ETOH abuse   . Skull lesion     Left occipital condyle  . History of radiation therapy 01/05/13-02/10/13    45 gray to left occipital condyle region  . Multiple myeloma ([place])   . Radiation 02/21/14-03/08/14    right posterior chest wall area 30 gray  . Radiation 04/19/14-05/02/14    lumbar spine 25 gray  . Diverticulitis 12/22/14   Past Surgical History  Procedure Laterality Date  . Cyst excision  1959    tail bone   Current Outpatient Prescriptions on File Prior to Visit  Medication Sig Dispense Refill  . acyclovir (ZOVIRAX) 400 MG tablet Take 1 tablet (400 mg total) by mouth 2 (two) times daily. 60 tablet 3  . ALPRAZolam (XANAX) 1 MG tablet  Take 1-2 tablets (1-2 mg total) by mouth at bedtime as needed for sleep. For anxiety and/or sleep 60 tablet 1  . amoxicillin-clavulanate (AUGMENTIN) 875-125 MG tablet Take 1 tablet by mouth 2 (two) times daily. 14 tablet 1  . aspirin EC 325 MG tablet Take 1 tablet (325 mg total) by mouth daily. 30 tablet 11  . Calcium Carb-Cholecalciferol (CALCIUM-VITAMIN D3) 600-500 MG-UNIT CAPS Take 1 tablet by mouth 2 (two) times daily. With food (Patient taking differently: Take 1 tablet by mouth 3 (three) times daily. With food) 60 capsule 6  . clotrimazole (LOTRIMIN) 1 % cream Apply 1 application topically 2 (two)  times daily. To ear (Patient taking differently: Apply 1 application topically 2 (two) times daily as needed (ear). To ear) 45 g 2  . cyclobenzaprine (FLEXERIL) 10 MG tablet Take 1 tablet (10 mg total) by mouth 3 (three) times daily as needed for muscle spasms. (Patient not taking: Reported on 11/27/2014) 30 tablet 0  . dexamethasone (DECADRON) 4 MG tablet Take 10 tablets (16m total) on Days 1, 8, & 15 of chemo. (Patient taking differently: Take 20 mg by mouth 2 (two) times a week. Every Tuesday and friday) 70 tablet 3  . diphenhydrAMINE (BENADRYL) 25 MG tablet Take 25 mg by mouth at bedtime as needed for sleep.    .Marland Kitchendoxepin (SINEQUAN) 10 MG capsule Take one at night for sleep (Patient not taking: Reported on 12/25/2014) 30 capsule 3  . Doxepin HCl (SILENOR) 6 MG TABS Take 1 tablet (6 mg total) by mouth at bedtime. (Patient not taking: Reported on 12/25/2014) 30 tablet 6  . gabapentin (NEURONTIN) 400 MG capsule Take 1 capsule (400 mg total) by mouth 4 (four) times daily. (Patient taking differently: Take 400 mg by mouth 2 (two) times daily as needed (pain). ) 120 capsule 2  . omeprazole (PRILOSEC) 20 MG capsule Take 1 capsule (20 mg total) by mouth daily. (Patient taking differently: Take 20 mg by mouth daily as needed (heartburn/acid reflux). ) 30 capsule 5  . ondansetron (ZOFRAN) 8 MG tablet Take 1 tablet (8 mg total) by mouth every 8 (eight) hours as needed for nausea or vomiting. 30 tablet 2  . oxyCODONE-acetaminophen (PERCOCET) 10-325 MG tablet Take 1 tablet by mouth every 4 (four) hours as needed for pain. 60 tablet 0  . potassium chloride SA (K-DUR,KLOR-CON) 20 MEQ tablet Take 2 tablets (40 mEq total) by mouth daily. 40 meq daily for 2 weeks 28 tablet 0  . tamsulosin (FLOMAX) 0.4 MG CAPS capsule Take 1 capsule (0.4 mg total) by mouth at bedtime. 30 capsule 3  . UNABLE TO FIND Normal Saline Flush - 10ML BID 60 Syringe 1  . Zoledronic Acid (ZOMETA IV) Inject into the vein. Every 28 days      Current Facility-Administered Medications on File Prior to Visit  Medication Dose Route Frequency Provider Last Rate Last Dose  . sodium chloride 0.9 % injection 10 mL  10 mL Intracatheter PRN TBaird Cancer PA-C   10 mL at 08/04/14 1103   Allergies  Allergen Reactions  . Codeine Other (See Comments)    Severe headache  . Morphine And Related Other (See Comments)    Makes him feel weird   Social History   Social History  . Marital Status: Married    Spouse Name: N/A  . Number of Children: 3  . Years of Education: N/A   Occupational History  .     Social History Main Topics  . Smoking  status: Never Smoker   . Smokeless tobacco: Never Used  . Alcohol Use: No     Comment: " Not much no more"  . Drug Use: No  . Sexual Activity: Not on file   Other Topics Concern  . Not on file   Social History Narrative      Review of Systems  All other systems reviewed and are negative.      Objective:   Physical Exam  Constitutional: He appears well-developed and well-nourished.  Eyes: Conjunctivae are normal. No scleral icterus.  Neck: Neck supple. No JVD present. No thyromegaly present.  Cardiovascular: Normal rate, regular rhythm and normal heart sounds.  Exam reveals no gallop and no friction rub.   No murmur heard. Pulmonary/Chest: Effort normal and breath sounds normal. No respiratory distress. He has no wheezes. He has no rales. He exhibits no tenderness.  Abdominal: Soft. Bowel sounds are normal. He exhibits no distension. There is no tenderness. There is no rebound and no guarding.  Musculoskeletal: He exhibits edema.  Lymphadenopathy:    He has no cervical adenopathy.  Vitals reviewed.         Assessment & Plan:  Weight gain - Plan: CBC with Differential/Platelet, Brain natriuretic peptide, COMPLETE METABOLIC PANEL WITH GFR  Localized edema - Plan: Brain natriuretic peptide, furosemide (LASIX) 20 MG tablet  Diverticulitis of colon - Plan: CT Abdomen  Pelvis W Contrast  Multiple myeloma without remission ([place])  The patient's exam today is not concerning. However this is a very stoic individual who rarely complains and therefore I take his concerns very seriously. His edema is not pronounced on exam however he has gained 16 pounds in the last 2 weeks that is substantial. Therefore went to start the patient on Lasix 20 mg by mouth daily and recheck him next week. I will check a BNP to evaluate for fluid retention. If elevated we will need to proceed with an echocardiogram of the heart area and I am also concerned about possible reaccumulation of his intra-abdominal abscess as a possible cause of his fatigue. He had similar symptoms prior to his hospitalization. Therefore I will repeat the CT scan of the abdomen and pelvis with contrast. I will also check his renal function to evaluate for possible acute renal failure. Patient is also been off chemotherapy for the last several weeks due to his infection and therefore I'm concerned about worsening of his multiple myeloma as a possible cause of his fatigue. I will check a CBC. Recheck the first of next week.

## 2015-02-16 NOTE — Telephone Encounter (Signed)
Pt has appt scheduled for Thursday Jan 19 at 11am at [facility] for CT scan, pt is to come to office and pick up contrast, lmtrc to pt for appt information

## 2015-02-17 LAB — BRAIN NATRIURETIC PEPTIDE: Brain Natriuretic Peptide: 47.1 pg/mL (ref 0.0–100.0)

## 2015-02-19 ENCOUNTER — Encounter (HOSPITAL_COMMUNITY): Payer: Medicare Other

## 2015-02-19 ENCOUNTER — Encounter (HOSPITAL_BASED_OUTPATIENT_CLINIC_OR_DEPARTMENT_OTHER): Payer: Medicare Other

## 2015-02-19 ENCOUNTER — Encounter (HOSPITAL_COMMUNITY): Payer: Medicare Other | Attending: Hematology and Oncology | Admitting: Hematology & Oncology

## 2015-02-19 ENCOUNTER — Ambulatory Visit (HOSPITAL_COMMUNITY): Payer: Medicare Other

## 2015-02-19 ENCOUNTER — Other Ambulatory Visit (HOSPITAL_COMMUNITY): Payer: Medicare Other

## 2015-02-19 ENCOUNTER — Encounter (HOSPITAL_COMMUNITY): Payer: Self-pay | Admitting: Hematology & Oncology

## 2015-02-19 VITALS — BP 142/89 | HR 92 | Temp 97.8°F | Resp 18 | Wt 226.4 lb

## 2015-02-19 DIAGNOSIS — R630 Anorexia: Secondary | ICD-10-CM | POA: Insufficient documentation

## 2015-02-19 DIAGNOSIS — M542 Cervicalgia: Secondary | ICD-10-CM | POA: Insufficient documentation

## 2015-02-19 DIAGNOSIS — C9000 Multiple myeloma not having achieved remission: Secondary | ICD-10-CM | POA: Diagnosis not present

## 2015-02-19 DIAGNOSIS — Z923 Personal history of irradiation: Secondary | ICD-10-CM | POA: Diagnosis not present

## 2015-02-19 DIAGNOSIS — G4700 Insomnia, unspecified: Secondary | ICD-10-CM

## 2015-02-19 DIAGNOSIS — K219 Gastro-esophageal reflux disease without esophagitis: Secondary | ICD-10-CM | POA: Insufficient documentation

## 2015-02-19 DIAGNOSIS — R634 Abnormal weight loss: Secondary | ICD-10-CM | POA: Diagnosis not present

## 2015-02-19 DIAGNOSIS — N400 Enlarged prostate without lower urinary tract symptoms: Secondary | ICD-10-CM | POA: Diagnosis not present

## 2015-02-19 DIAGNOSIS — D472 Monoclonal gammopathy: Secondary | ICD-10-CM | POA: Diagnosis not present

## 2015-02-19 DIAGNOSIS — M899 Disorder of bone, unspecified: Secondary | ICD-10-CM | POA: Diagnosis not present

## 2015-02-19 LAB — COMPREHENSIVE METABOLIC PANEL
ALT: 13 U/L — ABNORMAL LOW (ref 17–63)
AST: 15 U/L (ref 15–41)
Albumin: 3.7 g/dL (ref 3.5–5.0)
Alkaline Phosphatase: 26 U/L — ABNORMAL LOW (ref 38–126)
Anion gap: 8 (ref 5–15)
BUN: 19 mg/dL (ref 6–20)
CO2: 27 mmol/L (ref 22–32)
Calcium: 9 mg/dL (ref 8.9–10.3)
Chloride: 108 mmol/L (ref 101–111)
Creatinine, Ser: 0.71 mg/dL (ref 0.61–1.24)
GFR calc Af Amer: >60 mL/min (ref >60)
GFR calc non Af Amer: >60 mL/min (ref >60)
Glucose, Bld: 101 mg/dL — ABNORMAL HIGH (ref 65–99)
Potassium: 4 mmol/L (ref 3.5–5.1)
Sodium: 143 mmol/L (ref 135–145)
Total Bilirubin: 0.7 mg/dL (ref 0.3–1.2)
Total Protein: 6.4 g/dL — ABNORMAL LOW (ref 6.5–8.1)

## 2015-02-19 LAB — CBC WITH DIFFERENTIAL/PLATELET
Basophils Absolute: 0 10*3/uL (ref 0.0–0.1)
Basophils Relative: 0 %
Eosinophils Absolute: 0 10*3/uL (ref 0.0–0.7)
Eosinophils Relative: 1 %
HCT: 42.2 % (ref 39.0–52.0)
Hemoglobin: 14.1 g/dL (ref 13.0–17.0)
Lymphocytes Relative: 20 %
Lymphs Abs: 1.2 10*3/uL (ref 0.7–4.0)
MCH: 31.2 pg (ref 26.0–34.0)
MCHC: 33.4 g/dL (ref 30.0–36.0)
MCV: 93.4 fL (ref 78.0–100.0)
Monocytes Absolute: 0.6 10*3/uL (ref 0.1–1.0)
Monocytes Relative: 10 %
Neutro Abs: 4 10*3/uL (ref 1.7–7.7)
Neutrophils Relative %: 69 %
Platelets: 166 10*3/uL (ref 150–400)
RBC: 4.52 MIL/uL (ref 4.22–5.81)
RDW: 14 % (ref 11.5–15.5)
WBC: 5.9 10*3/uL (ref 4.0–10.5)

## 2015-02-19 MED ORDER — SODIUM CHLORIDE 0.9 % IV SOLN
Freq: Once | INTRAVENOUS | Status: AC
  Administered 2015-02-19: 12:00:00 via INTRAVENOUS

## 2015-02-19 MED ORDER — SODIUM CHLORIDE 0.9 % IJ SOLN
10.0000 mL | INTRAMUSCULAR | Status: DC | PRN
  Administered 2015-02-19: 10 mL
  Filled 2015-02-19: qty 10

## 2015-02-19 MED ORDER — ZOLEDRONIC ACID 4 MG/5ML IV CONC
4.0000 mg | Freq: Once | INTRAVENOUS | Status: AC
  Administered 2015-02-19: 4 mg via INTRAVENOUS
  Filled 2015-02-19: qty 5

## 2015-02-19 MED ORDER — ALPRAZOLAM 1 MG PO TABS: 1.0000 mg | ORAL_TABLET | Freq: Every evening | ORAL | Status: DC | PRN

## 2015-02-19 NOTE — Progress Notes (Signed)
Tolerated zometa infusion well.

## 2015-02-19 NOTE — Progress Notes (Signed)
Dean Fraction, MD [address] [place] [address]  Multiple Myeloma, IgG kappa  Left occipital condyle infiltrative lesion, not amenable to biopsy. Palliative XRT without a tissue diagnosis  Chest x-ray and right rib films were obtained which showed a soft tissue mass along the right posterior fifth rib with some rib destruction.  Monoclonal IgG kappa, normal IgG levels, suppressed IgM, abnormal kappa/lamda ratio  PET/CT  03/16/2014 Scattered hypermetabolic osseous lesions, including the calvarium, ribs, left scapula, sacrum, and left femoral shaft. An expansile left vertebral body lesion at L3 extends into the epidural space of the left lateral recess, and probably merits lumbar spine MRI assessment.  BMBX on 02/20/2014 with 33% kappa restricted plasma cells Normal FISH, normal cytogenetics  Peripheral evaluation shows very small M spike, normal CBC, normal CMP, myeloma survey grossly underestimates extent of bone involvement  CURRENT THERAPY: RD currently, RVD started on on 04/2014  INTERVAL HISTORY: Dean [name] 80 y.o. male returns for followup of multiple myeloma, monoclonal IgG kappa with scattered osseous lesions  Dean [name] returns to the [facility] today to receive treatment. He is receiving Zometa therapy in the chair today and is here today with two women. Revlimid and dex continue to be on hold.   He took lasix for his LE swelling. BNP was WNL. His swelling has gone down "considerably." He confirms that he's recovering well. His weight is down 7 pounds.   He says that his appetite is better than it was, and he's been "forcing it down whether he wants to or not." He confirms that he is eating, and his wife and daughter confirm this as well.  He confirms that his bowels are moving "most every day." He denies any abdominal pain, nausea or vomiting.    Multiple myeloma without remission (Dean [name])   11/16/2012 Initial Diagnosis L occipital  condyle destructive lesion, not biopsied secondary to location. XRT   12/01/2012 Imaging L3 superior endplate compression FX, no lytic lesions to suggest myeloma   02/06/2014 Imaging soft tissue mass along the right posterior fifth rib with some rib destruction   02/16/2014 Miscellaneous Normal CBC, Normal CMP, kappa,lamda ratio of 2.62 (abnormal), UIEP with slightly restricted band, IgG kappa, SPEP/IEP with monoclonal protein at 0.79 g/dl, normal igG, suppressed IGM   02/20/2014 Bone Marrow Biopsy 33% kappa restricted plasma cells Normal FISH, normal cytogenetics   02/21/2014 - 03/08/2014 Radiation Therapy 30Gy to R rib lesion, lesion not biopsied   03/16/2014 PET scan Scattered hypermetabolic osseous lesions, including the calvarium, ribs, left scapula, sacrum, and left femoral shaft. An expansile left vertebral body lesion at L3 extends into the epidural space of the left lateral recess,    03/17/2014 - 04/07/2014 Chemotherapy Velcade and Dexamethasone due to initial denial of Revlimid by insurance company.  Zometa monthly.   03/22/2014 Imaging MR_ L-Spine- Enhancing lesions compatible with multiple myeloma at L2, L3, and S1-2.   04/07/2014 - 07/28/2014 Chemotherapy RVD with Revlimid at 25 mg days 1-14.  Revlimid dose reduced to 25 mg every other day x 14 days with 7 day respite beginning on day 1 of cycle 2.   04/17/2014 Adverse Reaction Revlimid-induced rash.  Treated with steroids.   07/28/2014 - 08/04/2014 Chemotherapy Revlimid daily   08/04/2014 Adverse Reaction Velcade-induced peripheral neuropathy   08/04/2014 Treatment Plan Change D/C Velcade   08/11/2014 PET scan Response to therapy. Improvement and resolution of foci of osseous hypermetabolism.   08/22/2014 - 08/28/2014  Chemotherapy Revlimid 25 mg days 1-21 every 28 days   08/28/2014 Adverse Reaction Revlimid-induced rash. Medication held.  Medrol dose Pak prescribed.   09/04/2014 - 01/08/2015 Chemotherapy Revlimid 10 mg every other day, without dexamethasone    01/08/2015 - 01/15/2015 Hospital Admission Colonic diverticular abscess with IR drain placement     Past Medical History  Diagnosis Date  . Neuropathy ([place])   . BPH (benign prostatic hyperplasia)   . GERD (gastroesophageal reflux disease)   . H/O ETOH abuse   . Skull lesion     Left occipital condyle  . History of radiation therapy 01/05/13-02/10/13    45 gray to left occipital condyle region  . Multiple myeloma ([place])   . Radiation 02/21/14-03/08/14    right posterior chest wall area 30 gray  . Radiation 04/19/14-05/02/14    lumbar spine 25 gray  . Diverticulitis 12/22/14    has Neuropathy ([place]); BPH (benign prostatic hyperplasia); GERD (gastroesophageal reflux disease); H/O ETOH abuse; Skull lesion; Neck pain; Fatigue; Loss of weight; Insomnia; Allergic rhinitis; Lumbar compression fracture ([place]); Dermatitis; Multiple myeloma without remission ([place]); Colonic diverticular abscess; Thrombocytopenia ([place]); and Intra-abdominal abscess ([place]) on his problem list.     is allergic to codeine and morphine and related.  Dean [name] had no medications administered during this visit.  Past Surgical History  Procedure Laterality Date  . Cyst excision  1959    tail bone    Denies any headaches, dizziness, double vision, fevers, chills, night sweats, nausea, vomiting, diarrhea, constipation, chest pain, heart palpitations, shortness of breath, blood in stool, black tarry stool, urinary pain, urinary burning, urinary frequency, hematuria. Positive for trouble sleeping.     Unchanged  14 point review of systems was performed and is negative except as detailed under history of present illness and above  PHYSICAL EXAMINATION  ECOG PERFORMANCE STATUS: 0 - Asymptomatic   Filed Vitals:   02/19/15 1128  BP: 142/89  Pulse: 92  Temp: 97.8 F (36.6 C)  Resp: 18    GENERAL:alert, no distress, well nourished, well developed, comfortable, cooperative, obese and smiling SKIN: skin color, texture,  turgor are normal, no rashes or significant lesions HEAD: Normocephalic, No masses, lesions, tenderness or abnormalities EYES: normal, PERRLA, EOMI, Conjunctiva are pink and non-injected. Wears dark sunglasses all the time EARS: External ears normal OROPHARYNX:lips, buccal mucosa, and tongue normal and mucous membranes are moist  NECK: supple, no adenopathy, thyroid normal size, non-tender, without nodularity, no stridor, non-tender, trachea midline LYMPH:  no palpable lymphadenopathy BREAST:not examined LUNGS: clear to auscultation  HEART: regular rate & rhythm, no murmurs and no gallops ABDOMEN:abdomen soft, non-tender, obese and normal bowel sounds BACK: Back symmetric, no curvature., No CVA tenderness EXTREMITIES:less then 2 second capillary refill, no joint deformities, effusion, or inflammation, no edema, no skin discoloration, no clubbing, no cyanosis  NEURO: alert & oriented x 3 with fluent speech, no focal motor/sensory deficits, gait normal   LABORATORY DATA: I have reviewed the labs below.  CBC    Component Value Date/Time   WBC 5.9 02/19/2015 1029   RBC 4.52 02/19/2015 1029   HGB 14.1 02/19/2015 1029   HCT 42.2 02/19/2015 1029   PLT 166 02/19/2015 1029   MCV 93.4 02/19/2015 1029   MCH 31.2 02/19/2015 1029   MCHC 33.4 02/19/2015 1029   RDW 14.0 02/19/2015 1029   LYMPHSABS 1.2 02/19/2015 1029   MONOABS 0.6 02/19/2015 1029   EOSABS 0.0 02/19/2015 1029   BASOSABS 0.0 02/19/2015 1029  Chemistry      Component Value Date/Time   NA 143 02/19/2015 1029   K 4.0 02/19/2015 1029   CL 108 02/19/2015 1029   CO2 27 02/19/2015 1029   BUN 19 02/19/2015 1029   CREATININE 0.71 02/19/2015 1029   CREATININE 0.68* 02/16/2015 1027      Component Value Date/Time   CALCIUM 9.0 02/19/2015 1029   ALKPHOS 26* 02/19/2015 1029   AST 15 02/19/2015 1029   ALT 13* 02/19/2015 1029   BILITOT 0.7 02/19/2015 1029      RADIOGRAPHY: I have personally reviewed the radiological  images as listed and agreed with the findings in the report.  Ir Sinus/fist Tube Chk-non Gi  01/23/2015  CLINICAL DATA:  80 year old male with a history of diverticulitis and diverticular abscess treated by percutaneous drain placement on 01/09/2015. Follow-up CT scan performed on 01/13/2015 was negative for residual abscess. The patient has now completed outpatient antibiotic therapy. Drain output from the catheter is insignificant over these past several days. The patient has no significant discomfort or cellulitis. There is reflux of injected saline material to the skin surface during routine tube flushes. EXAM: SINUS TRACT INJECTION/FISTULOGRAM Date: 01/23/2015 PROCEDURE: 1. Contrast injection through existing drainage catheter under fluoroscopic guidance Interventional Radiologist:  Criselda Peaches, MD ANESTHESIA/SEDATION: None MEDICATIONS: None FLUOROSCOPY TIME:  48 seconds for a total of 8 mGy CONTRAST:  24m OMNIPAQUE IOHEXOL 300 MG/ML  SOLN TECHNIQUE: Informed consent was obtained from the patient following explanation of the procedure, risks, benefits and alternatives. The patient understands, agrees and consents for the procedure. All questions were addressed. A time out was performed. The existing drainage catheter was evaluated. There is no evidence of the drainage around the catheter tube entry site. No evidence of cellulitis, induration or subcutaneous abscess. An initial fluoroscopic spot image was obtained demonstrating the position of the catheter in the left lower quadrant abdomen. A gentle hand injection of contrast material was then performed. Contrast opacifies the now collapsed abscess cavity. Subsequently, contrast tracks back along the catheter tubing and exits the skin surface. There is no communication with the adjacent sigmoid colon. The drain was cut and removed.  A sterile bandage was placed. COMPLICATIONS: None IMPRESSION: 1. Negative for significant residual abscess cavity or  fistulous connection with the adjacent sigmoid colon. 2. The drain was removed. Signed, HCriselda Peaches MD [facility] Electronically Signed   By: HJacqulynn CadetM.D.   On: 01/23/2015 15:32    Study Result     CLINICAL DATA: Sigmoid diverticulitis with colonic diverticular abscess  EXAM: CT ABDOMEN AND PELVIS WITH CONTRAST  TECHNIQUE: Multidetector CT imaging of the abdomen and pelvis was performed using the standard protocol following bolus administration of intravenous contrast.  CONTRAST: 550mOMNIPAQUE IOHEXOL 300 MG/ML SOLN, 10020mMNIPAQUE IOHEXOL 300 MG/ML SOLN  COMPARISON: 01/09/2015 and 01/08/2015  FINDINGS: Sagittal images of the spine shows osteopenia and degenerative changes thoracolumbar spine.  There is small right pleural effusion with right base posterior atelectasis. Tiny calcified granuloma right base posteriorly. Lytic lesion in L3 vertebral body and sacrum are without change.  Enhanced liver, pancreas, spleen and adrenal glands are unremarkable. Left renal cortical and parapelvic cysts are stable. No hydronephrosis or hydroureter.  Delayed renal images shows bilateral renal symmetrical excretion. Bilateral visualized proximal ureter is unremarkable.  There is no small bowel obstruction. Small umbilical hernia containing fat without evidence of acute complication. No pericecal inflammation. Normal appendix. The terminal ileum is unremarkable.  Again noted scattered diverticula in descending colon. Multiple sigmoid colon diverticula are noted. There is a percutaneous drain in left lower abdomen. The fluid collection inferior to fascia left rectus muscle is resolved. No any residual fluid. Again noted mild inflammatory changes surrounding proximal sigmoid colon. Small amount of extraluminal air and tiny amount of fluid just anterior to sigmoid colon consistent with contained  perforation again noted. No extraluminal contrast material. No definite mesenteric abscess.  Again noted enlarged prostate gland with indentation of urinary bladder base. On there is no evidence of free abdominal air. No significant adenopathy. No inguinal adenopathy.  IMPRESSION: 1. There is a percutaneous left abdominal drain with the tip under left abdominal wall at the site of previous the previous fluid collection which has resolved. No any residual abscess or fluid. 2. Again noted sigmoid colon multiple diverticula. Persistent mild inflammatory changes just anterior to sigmoid colon, tiny amount of air bubbles/extraluminal air and small amount of fluid consistent with contained perforation please see axial image 60. There is no evidence of confluent abscess. No new mesenteric abscess. 3. No pericecal inflammation. Normal appendix. 4. No small bowel obstruction. 5. No hydronephrosis or hydroureter. Stable left renal cysts.   Electronically Signed  By: Lahoma Crocker M.D.  On: 01/13/2015 14:56     CLINICAL DATA: Subsequent treatment strategy for multiple myeloma.  EXAM: NUCLEAR MEDICINE PET WHOLE BODY  TECHNIQUE: 11.9 mCi F-18 FDG was injected intravenously. Full-ring PET imaging was performed from the vertex to the feet after the radiotracer. CT data was obtained and used for attenuation correction and anatomic localization.  FASTING BLOOD GLUCOSE: Value: 86 mg/dl  COMPARISON: PET-CT 08/11/2014 and 03/16/2014.  FINDINGS: NECK  No hypermetabolic cervical lymph nodes are identified.There are no lesions of the pharyngeal mucosal space. There is stable mild symmetric prominence of activity within the submandibular glands bilaterally.  CHEST  There are no hypermetabolic mediastinal, hilar or axillary lymph nodes. Interval significant improvement in the inflammatory changes in the right upper lobe with associated hypermetabolic activity. There are  some residual linear components without hypermetabolic activity. Within the right lower lobe, there is low-level hypermetabolic activity associated with subpleural ground-glass density. This has an SUV max of 5.8 and appears inflammatory. No suspicious pulmonary findings.  ABDOMEN/PELVIS  There is no hypermetabolic activity within the liver, adrenal glands, spleen or pancreas. There is no hypermetabolic nodal activity. There is extensive sigmoid diverticulosis with increased wall thickening and associated hypermetabolic activity. There is a new small lenticular shaped fluid collection with air bubbles beneath the left anterior abdominal wall, measuring 5.9 x 1.7 cm on image 195. This demonstrates hyper metabolism and is suspicious for a small abscess. No free intraperitoneal air or evidence of bowel obstruction. Moderate enlargement of the prostate gland, diffuse atherosclerosis and bilateral renal cortical and parapelvic cysts are again noted.  SKELETON  There is decreased metabolic activity throughout the lumbar spine attributed to radiation therapy. There is mild mottled activity throughout the skeleton without focally increased activity to suggest residual active multiple myeloma. The right sacral lesion measures 2.3 cm on image 192 and has no residual hypermetabolic activity (SUV max 2.5). The L3 lesion appears unchanged in size without residual hypermetabolic activity. Right rib deformities are noted without abnormal metabolic activity.  EXTREMITIES  No hypermetabolic activity to suggest metastasis.  IMPRESSION: 1. No evidence of residual metabolically active multiple myeloma in the axial or appendicular skeleton. The dominant lesions in the L3 vertebral body and right sacrum have not changed in size. 2.  No evidence of extra osseous neoplasm. 3. Interval improvement in inflammatory changes in the right lung, possibly related to previous radiation therapy. 4. New  hypermetabolism in the sigmoid colon associated with changes of probable diverticulitis and a small peridiverticular abscess. Bowel rest and antibiotic therapy recommended. 5. These results will be called to the ordering clinician or representative by the Radiologist Assistant, and communication documented in the PACS or zVision Dashboard.   Electronically Signed  By: Richardean Sale M.D.  On: 12/22/2014 11:11  ASSESSMENT AND PLAN:  IgG kappa myeloma Multiple bone lesions seen on PET imaging RVD therapy Rash on revlimid, currently on every other day dosing Insomnia Hypocalcemia Diverticulitis/peridiverticular abscess seen on PET imaging, patient asymptomatic IR drain placement of diverticular abscess  We will continue to hold his Revlimid.  I would like to see him in 4 weeks at which time we can discuss restaging his myeloma with PET (as this is the most reliable way to follow HIS disease) and restarting therapy.He will need surgical clearance first.  His edema is gone. He is eating well.  We will continue the monthly Zometa, calcium level permitting. He will continue on his current calcium and vitamin D dosing.   Orders Placed This Encounter  Procedures  . NM PET Image Restag (PS) Skull Base To Thigh    Standing Status: Future     Number of Occurrences:      Standing Expiration Date: 02/19/2016    Order Specific Question:  Reason for Exam (SYMPTOM  OR DIAGNOSIS REQUIRED)    Answer:  restaging myeloma    Order Specific Question:  Preferred imaging location?    Answer:  [facility]    Order Specific Question:  If indicated for the ordered procedure, I authorize the administration of a radiopharmaceutical per Radiology protocol    Answer:  Yes   All questions were answered. The patient knows to call the clinic with any problems, questions or concerns. We can certainly see the patient much sooner if necessary.   This note is electronically signed.  This document  serves as a record of services personally performed by Ancil Linsey, MD. It was created on her behalf by Toni Amend, a trained medical scribe. The creation of this record is based on the scribe's personal observations and the provider's statements to them. This document has been checked and approved by the attending provider.  I have reviewed the above documentation for accuracy and completeness, and I agree with the above.  Kelby Fam. Jerimy Johanson MD

## 2015-02-19 NOTE — Patient Instructions (Signed)
[place] at [facility] Discharge Instructions  RECOMMENDATIONS MADE BY THE CONSULTANT AND ANY TEST RESULTS WILL BE SENT TO YOUR REFERRING PHYSICIAN.   Exam completed by Dr Whitney Muse today Zometa today Zometa every 28 days No steroids (dexamethasone) or revlimid right now  PET scan prior to next Zometa treatment CT scan on thursday After that we can talk about re-starting treatment Xanax refilled  Return to see the doctor in 1 month Please call the clinic if you have any questions or concerns    Thank you for choosing [place] at [facility] to provide your oncology and hematology care.  To afford each patient quality time with our provider, please arrive at least 15 minutes before your scheduled appointment time.    You need to re-schedule your appointment should you arrive 10 or more minutes late.  We strive to give you quality time with our providers, and arriving late affects you and other patients whose appointments are after yours.  Also, if you no show three or more times for appointments you may be dismissed from the clinic at the providers discretion.     Again, thank you for choosing [facility].  Our hope is that these requests will decrease the amount of time that you wait before being seen by our physicians.       _____________________________________________________________  Should you have questions after your visit to [facility], please contact our office at [phone] between the hours of 8:30 a.m. and 4:30 p.m.  Voicemails left after 4:30 p.m. will not be returned until the following business day.  For prescription refill requests, have your pharmacy contact our office.

## 2015-02-19 NOTE — Telephone Encounter (Signed)
Pt aware of appt and already has contrast

## 2015-02-20 ENCOUNTER — Encounter: Payer: Self-pay | Admitting: Family Medicine

## 2015-02-20 ENCOUNTER — Ambulatory Visit (INDEPENDENT_AMBULATORY_CARE_PROVIDER_SITE_OTHER): Payer: Medicare Other | Admitting: Family Medicine

## 2015-02-20 VITALS — BP 90/64 | HR 98 | Temp 97.5°F | Resp 18 | Ht 69.0 in | Wt 226.0 lb

## 2015-02-20 DIAGNOSIS — R635 Abnormal weight gain: Secondary | ICD-10-CM | POA: Diagnosis not present

## 2015-02-20 DIAGNOSIS — R600 Localized edema: Secondary | ICD-10-CM | POA: Diagnosis not present

## 2015-02-20 LAB — KAPPA/LAMBDA LIGHT CHAINS
Kappa free light chain: 17.26 mg/L (ref 3.30–19.40)
Kappa, lambda light chain ratio: 1.13 (ref 0.26–1.65)
Lambda free light chains: 15.29 mg/L (ref 5.71–26.30)

## 2015-02-20 LAB — IMMUNOFIXATION ELECTROPHORESIS
IgA: 123 mg/dL (ref 61–437)
IgG (Immunoglobin G), Serum: 473 mg/dL — ABNORMAL LOW (ref 700–1600)
IgM, Serum: 36 mg/dL (ref 15–143)
Total Protein ELP: 5.8 g/dL — ABNORMAL LOW (ref 6.0–8.5)

## 2015-02-20 LAB — IGG, IGA, IGM
IgA: 112 mg/dL (ref 61–437)
IgG (Immunoglobin G), Serum: 472 mg/dL — ABNORMAL LOW (ref 700–1600)
IgM, Serum: 36 mg/dL (ref 15–143)

## 2015-02-20 LAB — PROTEIN ELECTROPHORESIS, SERUM
A/G Ratio: 1.1 (ref 0.7–1.7)
Albumin ELP: 3.1 g/dL (ref 2.9–4.4)
Alpha-1-Globulin: 0.3 g/dL (ref 0.0–0.4)
Alpha-2-Globulin: 0.9 g/dL (ref 0.4–1.0)
Beta Globulin: 1 g/dL (ref 0.7–1.3)
Gamma Globulin: 0.5 g/dL (ref 0.4–1.8)
Globulin, Total: 2.7 g/dL (ref 2.2–3.9)
Total Protein ELP: 5.8 g/dL — ABNORMAL LOW (ref 6.0–8.5)

## 2015-02-20 LAB — BETA 2 MICROGLOBULIN, SERUM: Beta-2 Microglobulin: 2.3 mg/L (ref 0.6–2.4)

## 2015-02-20 NOTE — Progress Notes (Signed)
Subjective:    Patient ID: Dean [name], male    DOB: [birth year], 80 y.o.   MRN: [id number]  HPI 02/06/14  patient has had right-sided chest wall pain for several months. However since December has become more severe and almost constant. The pain is located in his right chest near his right nipple. It is made worse by coughing, deep inspiration, and movement. He denies any angina. He denies any shortness of breath. He denies any hemoptysis. He denies any fevers or chills or weight loss. He denies any relationship of the pain to food or fatty meals. He does still have his gallbladder. He denies any weight loss or jaundice. He denies any fever or night sweats. I am unable to reproduce the pain today with deep palpation of the right chest and ribs.  At that time, my plan was:  patient's diagnosis is uncertain. I do not believe that this is cardiac in nature. EKG obtained today in the office reveals normal sinus rhythm with normal intervals and normal axis. However the patient does have mild nonspecific ST changes particularly in the inferior and lateral leads. This being said, the patient's pain is not typical for angina or cardiac pain. It sounds musculoskeletal in the chest wall. Therefore I will begin with a chest x-ray as well as x-rays of the ribs. I'll also check a CBC as well as a CMP. Further plan will be dictated based upon the results of his lab work and chest x-ray.  02/16/15 I have not seen the patient since that time.  In the interval, he was diagnosed with multiple myeloma and has been seeing hem/onc for this as well.  Admitted to hospital 12/16 with diverticulitis requiring IV abx and percutaneous drainage by IR.  While he was in the hospital, his chemotherapy was temporarily discontinued. After discharge from the hospital, the patient states his weight dropped to 212 pounds. Over the last 10 days, his weight has increased 16 pounds up to 228. He reports mild shortness of breath. He denies  any chest pain. He denies any pleurisy. He denies any hemoptysis. He denies any cough. He does report peripheral edema. He states that recently his legs a been extremely swollen. Today he has trace bipedal edema. It is not marked on his exam today, but the patient states yesterday his legs were tense and tight with fluid. All this raises the concern about possible CHF. However he also reports severe fatigue which is worsened over the last few weeks. He denies any abdominal pain or vomiting or blood in his stool.  At that time, my plan was: The patient's exam today is not concerning. However this is a very stoic individual who rarely complains and therefore I take his concerns very seriously. His edema is not pronounced on exam however he has gained 16 pounds in the last 2 weeks that is substantial. Therefore went to start the patient on Lasix 20 mg by mouth daily and recheck him next week. I will check a BNP to evaluate for fluid retention. If elevated we will need to proceed with an echocardiogram of the heart area and I am also concerned about possible reaccumulation of his intra-abdominal abscess as a possible cause of his fatigue. He had similar symptoms prior to his hospitalization. Therefore I will repeat the CT scan of the abdomen and pelvis with contrast. I will also check his renal function to evaluate for possible acute renal failure. Patient is also been off chemotherapy for the  last several weeks due to his infection and therefore I'm concerned about worsening of his multiple myeloma as a possible cause of his fatigue. I will check a CBC. Recheck the first of next week.  02/20/15 Patient states that he feels much better. He has lost 2 pounds since I last saw him. The swelling in his legs is essentially resolved. He denies any shortness of breath. He states overall he feels back to his baseline. His lab work was essentially normal. His BNP was 47 Past Medical History  Diagnosis Date  . Neuropathy  ([place])   . BPH (benign prostatic hyperplasia)   . GERD (gastroesophageal reflux disease)   . H/O ETOH abuse   . Skull lesion     Left occipital condyle  . History of radiation therapy 01/05/13-02/10/13    45 gray to left occipital condyle region  . Multiple myeloma ([place])   . Radiation 02/21/14-03/08/14    right posterior chest wall area 30 gray  . Radiation 04/19/14-05/02/14    lumbar spine 25 gray  . Diverticulitis 12/22/14   Past Surgical History  Procedure Laterality Date  . Cyst excision  1959    tail bone   Current Outpatient Prescriptions on File Prior to Visit  Medication Sig Dispense Refill  . acyclovir (ZOVIRAX) 400 MG tablet Take 1 tablet (400 mg total) by mouth 2 (two) times daily. 60 tablet 3  . ALPRAZolam (XANAX) 1 MG tablet Take 1-2 tablets (1-2 mg total) by mouth at bedtime as needed for sleep. For anxiety and/or sleep 60 tablet 1  . aspirin EC 325 MG tablet Take 1 tablet (325 mg total) by mouth daily. 30 tablet 11  . Calcium Carb-Cholecalciferol (CALCIUM-VITAMIN D3) 600-500 MG-UNIT CAPS Take 1 tablet by mouth 2 (two) times daily. With food 60 capsule 6  . diphenhydrAMINE (BENADRYL) 25 MG tablet Take 25 mg by mouth at bedtime as needed for sleep.    . furosemide (LASIX) 20 MG tablet Take 1 tablet (20 mg total) by mouth daily. 30 tablet 3  . gabapentin (NEURONTIN) 400 MG capsule Take 1 capsule (400 mg total) by mouth 4 (four) times daily. (Patient taking differently: Take 400 mg by mouth 2 (two) times daily as needed (pain). ) 120 capsule 2  . omeprazole (PRILOSEC) 20 MG capsule Take 1 capsule (20 mg total) by mouth daily. (Patient taking differently: Take 20 mg by mouth daily as needed (heartburn/acid reflux). ) 30 capsule 5  . ondansetron (ZOFRAN) 8 MG tablet Take 1 tablet (8 mg total) by mouth every 8 (eight) hours as needed for nausea or vomiting. 30 tablet 2  . tamsulosin (FLOMAX) 0.4 MG CAPS capsule Take 1 capsule (0.4 mg total) by mouth at bedtime. 30 capsule 3  . UNABLE  TO FIND Normal Saline Flush - 10ML BID 60 Syringe 1  . dexamethasone (DECADRON) 4 MG tablet Take 10 tablets (42m total) on Days 1, 8, & 15 of chemo. (Patient not taking: Reported on 02/20/2015) 70 tablet 3  . oxyCODONE-acetaminophen (PERCOCET) 10-325 MG tablet Take 1 tablet by mouth every 4 (four) hours as needed for pain. (Patient not taking: Reported on 02/20/2015) 60 tablet 0  . potassium chloride SA (K-DUR,KLOR-CON) 20 MEQ tablet Take 2 tablets (40 mEq total) by mouth daily. 40 meq daily for 2 weeks (Patient not taking: Reported on 02/19/2015) 28 tablet 0  . Zoledronic Acid (ZOMETA IV) Inject into the vein. Reported on 02/20/2015     Current Facility-Administered Medications on File Prior to Visit  Medication Dose Route Frequency Provider Last Rate Last Dose  . sodium chloride 0.9 % injection 10 mL  10 mL Intracatheter PRN Baird [facility], PA-C   10 mL at 08/04/14 1103   Allergies  Allergen Reactions  . Codeine Other (See Comments)    Severe headache  . Morphine And Related Other (See Comments)    Makes him feel weird   Social History   Social History  . Marital Status: Married    Spouse Name: N/A  . Number of Children: 3  . Years of Education: N/A   Occupational History  .     Social History Main Topics  . Smoking status: Never Smoker   . Smokeless tobacco: Never Used  . Alcohol Use: No     Comment: " Not much no more"  . Drug Use: No  . Sexual Activity: Not on file   Other Topics Concern  . Not on file   Social History Narrative      Review of Systems  All other systems reviewed and are negative.      Objective:   Physical Exam  Constitutional: He appears well-developed and well-nourished.  Eyes: Conjunctivae are normal. No scleral icterus.  Neck: Neck supple. No JVD present. No thyromegaly present.  Cardiovascular: Normal rate, regular rhythm and normal heart sounds.  Exam reveals no gallop and no friction rub.   No murmur heard. Pulmonary/Chest: Effort  normal and breath sounds normal. No respiratory distress. He has no wheezes. He has no rales. He exhibits no tenderness.  Abdominal: Soft. Bowel sounds are normal. He exhibits no distension. There is no tenderness. There is no rebound and no guarding.  Musculoskeletal: He exhibits no edema.  Lymphadenopathy:    He has no cervical adenopathy.  Vitals reviewed.         Assessment & Plan:  Weight gain  Localized edema change Lasix dose to 20 mg a day when necessary for leg swelling. Given the normal lab work and the fact the patient feels back to his baseline I do not feel he needs a more extensive workup at this time. I would like to reobtain CT scan to rule out reaccumulation of the abscess of the patient was recently treated for in the hospital.  Consider sleep study.

## 2015-02-22 ENCOUNTER — Ambulatory Visit (HOSPITAL_COMMUNITY)
Admission: RE | Admit: 2015-02-22 | Discharge: 2015-02-22 | Disposition: A | Discharge status: Home / Self Care | Payer: Medicare Other | Source: Ambulatory Visit | Attending: Family Medicine | Admitting: Family Medicine

## 2015-02-22 DIAGNOSIS — K5732 Diverticulitis of large intestine without perforation or abscess without bleeding: Secondary | ICD-10-CM | POA: Insufficient documentation

## 2015-02-22 DIAGNOSIS — K5730 Diverticulosis of large intestine without perforation or abscess without bleeding: Secondary | ICD-10-CM | POA: Diagnosis not present

## 2015-02-22 MED ORDER — IOHEXOL 300 MG/ML  SOLN
100.0000 mL | Freq: Once | INTRAMUSCULAR | Status: AC | PRN
  Administered 2015-02-22: 100 mL via INTRAVENOUS

## 2015-02-22 MED ORDER — DIATRIZOATE MEGLUMINE & SODIUM 66-10 % PO SOLN
ORAL | Status: AC
  Filled 2015-02-22: qty 30

## 2015-02-23 ENCOUNTER — Encounter: Payer: Self-pay | Admitting: Family Medicine

## 2015-02-24 DIAGNOSIS — K5780 Diverticulitis of intestine, part unspecified, with perforation and abscess without bleeding: Secondary | ICD-10-CM | POA: Diagnosis not present

## 2015-02-24 DIAGNOSIS — K219 Gastro-esophageal reflux disease without esophagitis: Secondary | ICD-10-CM | POA: Diagnosis not present

## 2015-02-24 DIAGNOSIS — K651 Peritoneal abscess: Secondary | ICD-10-CM | POA: Diagnosis not present

## 2015-02-24 DIAGNOSIS — D696 Thrombocytopenia, unspecified: Secondary | ICD-10-CM | POA: Diagnosis not present

## 2015-02-24 DIAGNOSIS — Z48815 Encounter for surgical aftercare following surgery on the digestive system: Secondary | ICD-10-CM | POA: Diagnosis not present

## 2015-02-24 DIAGNOSIS — G629 Polyneuropathy, unspecified: Secondary | ICD-10-CM | POA: Diagnosis not present

## 2015-03-01 DIAGNOSIS — G629 Polyneuropathy, unspecified: Secondary | ICD-10-CM | POA: Diagnosis not present

## 2015-03-01 DIAGNOSIS — Z48815 Encounter for surgical aftercare following surgery on the digestive system: Secondary | ICD-10-CM | POA: Diagnosis not present

## 2015-03-01 DIAGNOSIS — K5780 Diverticulitis of intestine, part unspecified, with perforation and abscess without bleeding: Secondary | ICD-10-CM | POA: Diagnosis not present

## 2015-03-01 DIAGNOSIS — K219 Gastro-esophageal reflux disease without esophagitis: Secondary | ICD-10-CM | POA: Diagnosis not present

## 2015-03-01 DIAGNOSIS — K651 Peritoneal abscess: Secondary | ICD-10-CM | POA: Diagnosis not present

## 2015-03-01 DIAGNOSIS — D696 Thrombocytopenia, unspecified: Secondary | ICD-10-CM | POA: Diagnosis not present

## 2015-03-02 ENCOUNTER — Encounter (HOSPITAL_COMMUNITY)
Admission: RE | Admit: 2015-03-02 | Discharge: 2015-03-02 | Disposition: A | Discharge status: Home / Self Care | Payer: Medicare Other | Source: Ambulatory Visit | Attending: Hematology & Oncology | Admitting: Hematology & Oncology

## 2015-03-02 DIAGNOSIS — C9000 Multiple myeloma not having achieved remission: Secondary | ICD-10-CM

## 2015-03-02 LAB — GLUCOSE, CAPILLARY: Glucose-Capillary: 79 mg/dL (ref 65–99)

## 2015-03-02 MED ORDER — FLUDEOXYGLUCOSE F - 18 (FDG) INJECTION
11.7000 | Freq: Once | INTRAVENOUS | Status: AC | PRN
  Administered 2015-03-02: 11.7 via INTRAVENOUS

## 2015-03-16 DIAGNOSIS — K5780 Diverticulitis of intestine, part unspecified, with perforation and abscess without bleeding: Secondary | ICD-10-CM | POA: Diagnosis not present

## 2015-03-16 DIAGNOSIS — Z48815 Encounter for surgical aftercare following surgery on the digestive system: Secondary | ICD-10-CM | POA: Diagnosis not present

## 2015-03-16 DIAGNOSIS — D696 Thrombocytopenia, unspecified: Secondary | ICD-10-CM | POA: Diagnosis not present

## 2015-03-16 DIAGNOSIS — G629 Polyneuropathy, unspecified: Secondary | ICD-10-CM | POA: Diagnosis not present

## 2015-03-16 DIAGNOSIS — K219 Gastro-esophageal reflux disease without esophagitis: Secondary | ICD-10-CM | POA: Diagnosis not present

## 2015-03-16 DIAGNOSIS — K651 Peritoneal abscess: Secondary | ICD-10-CM | POA: Diagnosis not present

## 2015-03-19 ENCOUNTER — Encounter (HOSPITAL_COMMUNITY): Payer: Medicare Other | Attending: Hematology and Oncology

## 2015-03-19 DIAGNOSIS — K219 Gastro-esophageal reflux disease without esophagitis: Secondary | ICD-10-CM | POA: Insufficient documentation

## 2015-03-19 DIAGNOSIS — N400 Enlarged prostate without lower urinary tract symptoms: Secondary | ICD-10-CM | POA: Insufficient documentation

## 2015-03-19 DIAGNOSIS — M899 Disorder of bone, unspecified: Secondary | ICD-10-CM | POA: Diagnosis not present

## 2015-03-19 DIAGNOSIS — Z923 Personal history of irradiation: Secondary | ICD-10-CM | POA: Diagnosis not present

## 2015-03-19 DIAGNOSIS — C9000 Multiple myeloma not having achieved remission: Secondary | ICD-10-CM | POA: Diagnosis present

## 2015-03-19 DIAGNOSIS — R634 Abnormal weight loss: Secondary | ICD-10-CM | POA: Insufficient documentation

## 2015-03-19 DIAGNOSIS — M542 Cervicalgia: Secondary | ICD-10-CM | POA: Insufficient documentation

## 2015-03-19 DIAGNOSIS — R630 Anorexia: Secondary | ICD-10-CM | POA: Insufficient documentation

## 2015-03-19 DIAGNOSIS — D472 Monoclonal gammopathy: Secondary | ICD-10-CM | POA: Insufficient documentation

## 2015-03-19 LAB — COMPREHENSIVE METABOLIC PANEL
ALT: 11 U/L — ABNORMAL LOW (ref 17–63)
AST: 17 U/L (ref 15–41)
Albumin: 3.6 g/dL (ref 3.5–5.0)
Alkaline Phosphatase: 24 U/L — ABNORMAL LOW (ref 38–126)
Anion gap: 7 (ref 5–15)
BUN: 15 mg/dL (ref 6–20)
CO2: 27 mmol/L (ref 22–32)
Calcium: 8.7 mg/dL — ABNORMAL LOW (ref 8.9–10.3)
Chloride: 109 mmol/L (ref 101–111)
Creatinine, Ser: 0.68 mg/dL (ref 0.61–1.24)
GFR calc Af Amer: >60 mL/min (ref >60)
GFR calc non Af Amer: >60 mL/min (ref >60)
Glucose, Bld: 99 mg/dL (ref 65–99)
Potassium: 4.2 mmol/L (ref 3.5–5.1)
Sodium: 143 mmol/L (ref 135–145)
Total Bilirubin: 0.4 mg/dL (ref 0.3–1.2)
Total Protein: 6.5 g/dL (ref 6.5–8.1)

## 2015-03-19 NOTE — Patient Instructions (Signed)
[place] at [facility] Discharge Instructions  RECOMMENDATIONS MADE BY THE CONSULTANT AND ANY TEST RESULTS WILL BE SENT TO YOUR REFERRING PHYSICIAN.  HOLD Zometa today. Return as scheduled.  Thank you for choosing [place] at [facility] to provide your oncology and hematology care.  To afford each patient quality time with our provider, please arrive at least 15 minutes before your scheduled appointment time.   Beginning January 23rd 2017 lab work for the [facility] will be done in the  Main lab at [facility] on 1st floor. If you have a lab appointment with the [facility] please come in thru the  Main Entrance and check in at the main information desk  You need to re-schedule your appointment should you arrive 10 or more minutes late.  We strive to give you quality time with our providers, and arriving late affects you and other patients whose appointments are after yours.  Also, if you no show three or more times for appointments you may be dismissed from the clinic at the providers discretion.     Again, thank you for choosing [facility]  Our hope is that these requests will decrease the amount of time that you wait before being seen by our physicians.       _____________________________________________________________  Should you have questions after your visit to [facility], please contact our office at [phone] between the hours of 8:30 a.m. and 4:30 p.m.  Voicemails left after 4:30 p.m. will not be returned until the following business day.  For prescription refill requests, have your pharmacy contact our office.

## 2015-03-19 NOTE — Progress Notes (Signed)
Per Meriel Flavors HOLD zometa today. Informed patient, patient verbalized understanding.

## 2015-03-22 ENCOUNTER — Ambulatory Visit (HOSPITAL_COMMUNITY): Payer: Medicare Other

## 2015-03-22 ENCOUNTER — Encounter (HOSPITAL_COMMUNITY): Payer: Self-pay | Admitting: Hematology & Oncology

## 2015-03-22 ENCOUNTER — Encounter (HOSPITAL_BASED_OUTPATIENT_CLINIC_OR_DEPARTMENT_OTHER): Payer: Medicare Other | Admitting: Hematology & Oncology

## 2015-03-22 VITALS — BP 142/67 | HR 73 | Temp 97.8°F | Resp 18 | Wt 234.0 lb

## 2015-03-22 DIAGNOSIS — G4700 Insomnia, unspecified: Secondary | ICD-10-CM | POA: Diagnosis not present

## 2015-03-22 DIAGNOSIS — K5720 Diverticulitis of large intestine with perforation and abscess without bleeding: Secondary | ICD-10-CM

## 2015-03-22 DIAGNOSIS — R21 Rash and other nonspecific skin eruption: Secondary | ICD-10-CM

## 2015-03-22 DIAGNOSIS — C9000 Multiple myeloma not having achieved remission: Secondary | ICD-10-CM

## 2015-03-22 NOTE — Patient Instructions (Addendum)
[place] at [facility] Discharge Instructions  RECOMMENDATIONS MADE BY THE CONSULTANT AND ANY TEST RESULTS WILL BE SENT TO YOUR REFERRING PHYSICIAN.   Exam and discussion by Dr Whitney Muse today We can not address your myeloma without having your colon addressed. You are going to have to see the surgeon and seriously consider the surgery. Myeloma is in remission  Dr Whitney Muse is going to call your daughter, you can let us know your decision in a week  We can do a colonoscopy first  Return to see the doctor as scheduled Please call the clinic if you have any questions or concerns   Thank you for choosing [place] at [facility] to provide your oncology and hematology care.  To afford each patient quality time with our provider, please arrive at least 15 minutes before your scheduled appointment time.   Beginning January 23rd 2017 lab work for the [facility] will be done in the  Main lab at [facility] on 1st floor. If you have a lab appointment with the [facility] please come in thru the  Main Entrance and check in at the main information desk  You need to re-schedule your appointment should you arrive 10 or more minutes late.  We strive to give you quality time with our providers, and arriving late affects you and other patients whose appointments are after yours.  Also, if you no show three or more times for appointments you may be dismissed from the clinic at the providers discretion.     Again, thank you for choosing [facility].  Our hope is that these requests will decrease the amount of time that you wait before being seen by our physicians.       _____________________________________________________________  Should you have questions after your visit to [facility], please contact our office at [phone] between the hours of 8:30 a.m. and 4:30 p.m.  Voicemails left after 4:30 p.m. will not be returned  until the following business day.  For prescription refill requests, have your pharmacy contact our office.

## 2015-03-22 NOTE — Progress Notes (Signed)
[name], MD [address] [place] [address]  Multiple Myeloma, IgG kappa  Left occipital condyle infiltrative lesion, not amenable to biopsy. Palliative XRT without a tissue diagnosis  Chest x-ray and right rib films were obtained which showed a soft tissue mass along the right posterior fifth rib with some rib destruction.  Monoclonal IgG kappa, normal IgG levels, suppressed IgM, abnormal kappa/lamda ratio  PET/CT  03/16/2014 Scattered hypermetabolic osseous lesions, including the calvarium, ribs, left scapula, sacrum, and left femoral shaft. An expansile left vertebral body lesion at L3 extends into the epidural space of the left lateral recess, and probably merits lumbar spine MRI assessment.  BMBX on 02/20/2014 with 33% kappa restricted plasma cells Normal FISH, normal cytogenetics  Peripheral evaluation shows very small M spike, normal CBC, normal CMP, myeloma survey grossly underestimates extent of bone involvement    Multiple myeloma without remission ([place])   11/16/2012 Initial Diagnosis L occipital condyle destructive lesion, not biopsied secondary to location. XRT   12/01/2012 Imaging L3 superior endplate compression FX, no lytic lesions to suggest myeloma   02/06/2014 Imaging soft tissue mass along the right posterior fifth rib with some rib destruction   02/16/2014 Miscellaneous Normal CBC, Normal CMP, kappa,lamda ratio of 2.62 (abnormal), UIEP with slightly restricted band, IgG kappa, SPEP/IEP with monoclonal protein at 0.79 g/dl, normal igG, suppressed IGM   02/20/2014 Bone Marrow Biopsy 33% kappa restricted plasma cells Normal FISH, normal cytogenetics   02/21/2014 - 03/08/2014 Radiation Therapy 30Gy to R rib lesion, lesion not biopsied   03/16/2014 PET scan Scattered hypermetabolic osseous lesions, including the calvarium, ribs, left scapula, sacrum, and left femoral shaft. An expansile left vertebral body lesion at L3 extends into the epidural  space of the left lateral recess,    03/17/2014 - 04/07/2014 Chemotherapy Velcade and Dexamethasone due to initial denial of Revlimid by insurance company.  Zometa monthly.   03/22/2014 Imaging MR_ L-Spine- Enhancing lesions compatible with multiple myeloma at L2, L3, and S1-2.   04/07/2014 - 07/28/2014 Chemotherapy RVD with Revlimid at 25 mg days 1-14.  Revlimid dose reduced to 25 mg every other day x 14 days with 7 day respite beginning on day 1 of cycle 2.   04/17/2014 Adverse Reaction Revlimid-induced rash.  Treated with steroids.   07/28/2014 - 08/04/2014 Chemotherapy Revlimid daily   08/04/2014 Adverse Reaction Velcade-induced peripheral neuropathy   08/04/2014 Treatment Plan Change D/C Velcade   08/11/2014 PET scan Response to therapy. Improvement and resolution of foci of osseous hypermetabolism.   08/22/2014 - 08/28/2014 Chemotherapy Revlimid 25 mg days 1-21 every 28 days   08/28/2014 Adverse Reaction Revlimid-induced rash. Medication held.  Medrol dose Pak prescribed.   09/04/2014 - 01/08/2015 Chemotherapy Revlimid 10 mg every other day, without dexamethasone   01/08/2015 - 01/15/2015 Hospital Admission Colonic diverticular abscess with IR drain placement   03/02/2015 PET scan Only bony uptake is low activity at site of deformity/callus at R second rib FX, high activity in sigmoid colon, advanced sigmoid divertidulosis. Abscess not resolved?      CURRENT THERAPY: Zometa  INTERVAL HISTORY: Dean [name] 80 y.o. male returns for followup of multiple myeloma, monoclonal IgG kappa with scattered osseous lesions  Dean [name] is here alone. I have personally reviewed and went over imaging studies at length with the patient; specifically discussing the abnormality of his sigmoid colon. His myeloma is in remission.   The patient is apprehensive about having a colostomy bag.  He would prefer that they "put a piece in there rather than a bag". He states that he is tired and has never been sick before. A childhood  friend of his is dealing with cancer as well and has a bag. He wants to think things over before deciding how to move forward. He "wants to let it go, really".   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 He notes that his other daughter, [place], will be coming into town to visit soon.  He has spoken with Dr. Johney Maine about surgery. He states "I just do not know if I want a colonoscopy or surgery."  Denies bowel issues or blood in stool. No nausea, abdominal pain. He denies diarrhea or constipation.   Past Medical History  Diagnosis Date  . Neuropathy ([place])   . BPH (benign prostatic hyperplasia)   . GERD (gastroesophageal reflux disease)   . H/O ETOH abuse   . Skull lesion     Left occipital condyle  . History of radiation therapy 01/05/13-02/10/13    45 gray to left occipital condyle region  . Multiple myeloma ([place])   . Radiation 02/21/14-03/08/14    right posterior chest wall area 30 gray  . Radiation 04/19/14-05/02/14    lumbar spine 25 gray  . Diverticulitis 12/22/14    has Neuropathy ([place]); BPH (benign prostatic hyperplasia); GERD (gastroesophageal reflux disease); H/O ETOH abuse; Skull lesion; Neck pain; Fatigue; Loss of weight; Insomnia; Allergic rhinitis; Lumbar compression fracture ([place]); Dermatitis; Multiple myeloma without remission ([place]); Colonic diverticular abscess; Thrombocytopenia ([place]); and Intra-abdominal abscess ([place]) on his problem list.     is allergic to codeine and morphine and related.  Dean [name] had no medications administered during this visit.  Past Surgical History  Procedure Laterality Date  . Cyst excision  1959    tail bone    Denies any headaches, dizziness, double vision, fevers, chills, night sweats, nausea, vomiting, diarrhea, constipation, chest pain, heart palpitations, shortness of breath, blood in stool, black tarry stool, urinary pain, urinary burning, urinary frequency,  hematuria. Positive for trouble sleeping.     Unchanged Positive for nervous/anxious  14 point review of systems was performed and is negative except as detailed under history of present illness and above  PHYSICAL EXAMINATION  ECOG PERFORMANCE STATUS: 0 - Asymptomatic   Filed Vitals:   03/22/15 1113  BP: 142/67  Pulse: 73  Temp: 97.8 F (36.6 C)  Resp: 18    GENERAL:alert, no distress, well nourished, well developed, comfortable, cooperative, obese and smiling SKIN: skin color, texture, turgor are normal, no rashes or significant lesions HEAD: Normocephalic, No masses, lesions, tenderness or abnormalities EYES: normal, PERRLA, EOMI, Conjunctiva are pink and non-injected. Wears dark sunglasses all the time EARS: External ears normal OROPHARYNX:lips, buccal mucosa, and tongue normal and mucous membranes are moist  NECK: supple, no adenopathy, thyroid normal size, non-tender, without nodularity, no stridor, non-tender, trachea midline LYMPH:  no palpable lymphadenopathy BREAST:not examined LUNGS: clear to auscultation  HEART: regular rate & rhythm, no murmurs and no gallops ABDOMEN:abdomen soft, non-tender, obese and normal bowel sounds BACK: Back symmetric, no curvature., No CVA tenderness EXTREMITIES:less then 2 second capillary refill, no joint deformities, effusion, or inflammation, no edema, no skin discoloration, no clubbing, no cyanosis  NEURO: alert & oriented x 3 with fluent speech, no focal motor/sensory deficits, gait normal   LABORATORY DATA: I have reviewed the labs below. CBC  Component Value Date/Time   WBC 5.9 02/19/2015 1029   RBC 4.52 02/19/2015 1029   HGB 14.1 02/19/2015 1029   HCT 42.2 02/19/2015 1029   PLT 166 02/19/2015 1029   MCV 93.4 02/19/2015 1029   MCH 31.2 02/19/2015 1029   MCHC 33.4 02/19/2015 1029   RDW 14.0 02/19/2015 1029   LYMPHSABS 1.2 02/19/2015 1029   MONOABS 0.6 02/19/2015 1029   EOSABS 0.0 02/19/2015 1029   BASOSABS 0.0  02/19/2015 1029      Chemistry      Component Value Date/Time   NA 143 03/19/2015 1150   K 4.2 03/19/2015 1150   CL 109 03/19/2015 1150   CO2 27 03/19/2015 1150   BUN 15 03/19/2015 1150   CREATININE 0.68 03/19/2015 1150   CREATININE 0.68* 02/16/2015 1027      Component Value Date/Time   CALCIUM 8.7* 03/19/2015 1150   ALKPHOS 24* 03/19/2015 1150   AST 17 03/19/2015 1150   ALT 11* 03/19/2015 1150   BILITOT 0.4 03/19/2015 1150      RADIOGRAPHY: I have personally reviewed the radiological images as listed and agreed with the findings in the report.  Ct Abdomen Pelvis W Contrast  02/22/2015  CLINICAL DATA:  History diverticulitis with abscess which has been drained. Follow-up evaluation. Subsequent encounter. EXAM: CT ABDOMEN AND PELVIS WITH CONTRAST TECHNIQUE: Multidetector CT imaging of the abdomen and pelvis was performed using the standard protocol following bolus administration of intravenous contrast. CONTRAST:  166m OMNIPAQUE IOHEXOL 300 MG/ML  SOLN COMPARISON:  01/13/2015 FINDINGS: Lower chest:  Lung bases are clear.  Normal heart size. Hepatobiliary: No hepatic mass. No intrahepatic or extrahepatic biliary ductal dilatation. Decompressed bladder scratched a decompressed gallbladder. Pancreas: Normal. Spleen: Normal. Adrenals/Urinary Tract: Normal adrenal glands. 2.6 x 1.9 cm hypodense, fluid attenuating left renal mass most consistent with a cyst. No urolithiasis or obstructive uropathy. Mild bladder wall thickening. Enlarged prostate measuring 5.2 x 6.2 cm. Stomach/Bowel: No bowel wall thickening or dilatation. Diverticulosis without evidence of diverticulitis. No drainable fluid collection. Small umbilical hernia. No pneumatosis, pneumoperitoneum or portal venous gas. Normal appendix. No extraluminal contrast. Vascular/Lymphatic: Normal caliber abdominal aorta with atherosclerosis. No lymphadenopathy. Other: No fluid collection or hematoma. Musculoskeletal: No lytic or sclerotic  osseous lesion. Chronic L4 compression fracture. Diffuse degenerative disc disease and facet arthropathy of the lumbar spine. Mild osteoarthritis of the right hip. IMPRESSION: 1. Diverticulosis without evidence of diverticulitis. No drainable fluid collection. Electronically Signed   By: HKathreen Devoid  On: 02/22/2015 12:48   Nm Pet Image Restag (ps) Skull Base To Thigh  03/02/2015  CLINICAL DATA:  Subsequent treatment strategy for multiple myeloma. EXAM: NUCLEAR MEDICINE PET WHOLE BODY TECHNIQUE: 11.7 mCi F-18 FDG was injected intravenously. Full-ring PET imaging was performed from the vertex to the feet after the radiotracer. CT data was obtained and used for attenuation correction and anatomic localization. FASTING BLOOD GLUCOSE:  Value: 79 mg/dl COMPARISON:  12/22/2014 FINDINGS: Head/Neck: High activity in the scalp, and in the nose, likely physiologic. No adenopathy in the neck or significant worrisome bony lesions. Grossly normal intracranial activity in the brain. Chest: There is high activity in a mid thoracic intervertebral spurring anteriorly, maximum standard uptake value 10.4, probably inflammatory given the presence of this finding in a spur. Coronary, aortic arch, and branch vessel atherosclerotic vascular disease. There is evidence of old granulomatous disease. Scarring posteriorly in the right upper lobe similar to prior. Abdomen/Pelvis: Sigmoid diverticulosis with high activity in the sigmoid colon. There continues  to be a small band of density with a small amount of gas density anterior to the sigmoid colon, potentially representing a tiny residual abscess or an unusual diverticulum with surrounding chronic inflammatory findings. The appearance is less striking than on the prior exam but is still visible on images 192-193 of series 4. Prominent prostate gland, 6.0 by 5.1 cm on image 219 series 4. Skeleton: Faintly increased uptake laterally at the deformity/callus in the right second rib, maximum  standard uptake value 5.4, no change in the CT appearance. There is deformity of the right fifth rib with some expansile component but without hypermetabolic activity posterolaterally, CT abnormality shown on image 91 series for and not appreciably changed in morphology. No lesions in the visualized appendicular skeleton. No hypermetabolic activity at the L3 lesion, the right sacral lesion, or any of the other prior identified lesions. Extremities: No hypermetabolic activity to suggest metastasis. IMPRESSION: 1. The only currently active bony uptake is low grade activity at the site of the deformity/callus at the right second rib fracture. This may have been a pathologic fracture originally. There is some callus formation and sclerosis along with some central lucency at this site. Maximum standard uptake value is 5.4. None of the other prior bony lesions demonstrate any significant hypermetabolic activity. 2. There is some high activity in a mid thoracic intervertebral spur which is highly likely to be inflammatory rather than neoplastic. 3. There continues to be high activity in the sigmoid colon along with fairly advanced sigmoid colon diverticulosis. If the patient has not had recent colonoscopy thin it may be prudent to ensure lack of tumor in the sigmoid colon. Unusual diverticulum or small abscess anterior to the sigmoid colon is reduced in size and prominence but not completely resolved, with a tiny amount of potentially extra locular gas detailed above. Electronically Signed   By: Van Clines M.D.   On: 03/02/2015 15:14     ASSESSMENT AND PLAN:  IgG kappa myeloma Multiple bone lesions seen on PET imaging RVD therapy Rash on revlimid, currently on every other day dosing Insomnia Hypocalcemia Diverticulitis/peridiverticular abscess seen on PET imaging, patient asymptomatic IR drain placement of diverticular abscess   I have personally reviewed and went over imaging studies at length with  the patient; specifically discussing the abnormality of his sigmoid colon. His myeloma is in remission. He is nervous about surgery and having even a temporary colostomy bag placed. He is resistant to a GI referral.   His daughter that works at [employer] is named Engineer, building services. The patient agreed that it would be okay if I spoke with her on the phone. We will discuss her father having a colonoscopy performed to further evaluate his disease. He agrees to let me discuss his current health issues with his daughter and he will then reconsider how to proceed.  Further plans to follow over the next week. He will formally return in 1 month. No additional therapy for myeloma at this point until his bowel issues are resolved.   All questions were answered. The patient knows to call the clinic with any problems, questions or concerns. We can certainly see the patient much sooner if necessary.   This note is electronically signed.  This document serves as a record of services personally performed by Ancil Linsey, MD. It was created on her behalf by Arlyce Harman, a trained medical scribe. The creation of this record is based on the scribe's personal observations and the provider's statements to them. This document has been  checked and approved by the attending provider.  I have reviewed the above documentation for accuracy and completeness, and I agree with the above.  Kelby Fam. Jorel Gravlin MD

## 2015-03-23 NOTE — Progress Notes (Signed)
Pt called and wanted to know when he could have colonoscopy done.  Spoke with Dr Whitney Muse, tried to call pt back today, we will put the referral in for a colonoscopy and he can have it as soon as he can.

## 2015-03-28 ENCOUNTER — Telehealth: Payer: Self-pay

## 2015-03-28 NOTE — Telephone Encounter (Signed)
I got a staff message that the pt's daughter was a good contact for the pt. I have called Hinton Dyer at [phone] and lmom for a return call in reference to the colonoscopy.

## 2015-03-28 NOTE — Telephone Encounter (Signed)
Dean [name], daughter, was returning call to DS. Please call her at [phone]

## 2015-03-28 NOTE — Telephone Encounter (Signed)
Gastroenterology Pre-Procedure Review  Request Date:  03/28/2015 Requesting Physician: Dr. Whitney Muse  PATIENT REVIEW QUESTIONS: The patient responded to the following health history questions as indicated:    Pt was referred for ASAP colonoscopy/ no GI problems Scheduled with Dr. Oneida Alar for 04/19/2015 Wanted appt before he goes back to see Dr. Whitney Muse on 04/16/2015   1. Diabetes Melitis: no 2. Joint replacements in the past 12 months: no 3. Major health problems in the past 3 months: no 4. Has an artificial valve or MVP: no 5. Has a defibrillator: no 6. Has been advised in past to take antibiotics in advance of a procedure like teeth cleaning: no 7. Family history of colon cancer: no  8. Alcohol Use: Rarely 9. History of sleep apnea: no     MEDICATIONS & ALLERGIES:    Patient reports the following regarding taking any blood thinners:   Plavix? no Aspirin? YES Coumadin? no  Patient confirms/reports the following medications:  Current Outpatient Prescriptions  Medication Sig Dispense Refill  . acyclovir (ZOVIRAX) 400 MG tablet Take 1 tablet (400 mg total) by mouth 2 (two) times daily. 60 tablet 3  . ALPRAZolam (XANAX) 1 MG tablet Take 1-2 tablets (1-2 mg total) by mouth at bedtime as needed for sleep. For anxiety and/or sleep 60 tablet 1  . aspirin EC 325 MG tablet Take 1 tablet (325 mg total) by mouth daily. 30 tablet 11  . Calcium Carb-Cholecalciferol (CALCIUM-VITAMIN D3) 600-500 MG-UNIT CAPS Take 1 tablet by mouth 2 (two) times daily. With food 60 capsule 6  . diphenhydrAMINE (BENADRYL) 25 MG tablet Take 25 mg by mouth at bedtime as needed for sleep.    Marland Kitchen gabapentin (NEURONTIN) 400 MG capsule Take 1 capsule (400 mg total) by mouth 4 (four) times daily. (Patient taking differently: Take 400 mg by mouth 2 (two) times daily as needed (pain). ) 120 capsule 2  . omeprazole (PRILOSEC) 20 MG capsule Take 1 capsule (20 mg total) by mouth daily. (Patient taking differently: Take 20 mg by  mouth daily. As needed) 30 capsule 5  . tamsulosin (FLOMAX) 0.4 MG CAPS capsule Take 1 capsule (0.4 mg total) by mouth at bedtime. 30 capsule 3  . dexamethasone (DECADRON) 4 MG tablet Take 10 tablets (40mg  total) on Days 1, 8, & 15 of chemo. (Patient not taking: Reported on 02/20/2015) 70 tablet 3  . furosemide (LASIX) 20 MG tablet Take 1 tablet (20 mg total) by mouth daily. (Patient not taking: Reported on 03/28/2015) 30 tablet 3  . ondansetron (ZOFRAN) 8 MG tablet Take 1 tablet (8 mg total) by mouth every 8 (eight) hours as needed for nausea or vomiting. (Patient not taking: Reported on 03/22/2015) 30 tablet 2  . oxyCODONE-acetaminophen (PERCOCET) 10-325 MG tablet Take 1 tablet by mouth every 4 (four) hours as needed for pain. (Patient not taking: Reported on 03/28/2015) 60 tablet 0  . potassium chloride SA (K-DUR,KLOR-CON) 20 MEQ tablet Take 2 tablets (40 mEq total) by mouth daily. 40 meq daily for 2 weeks (Patient not taking: Reported on 02/19/2015) 28 tablet 0  . UNABLE TO FIND Normal Saline Flush - 10ML BID 60 Syringe 1  . Zoledronic Acid (ZOMETA IV) Inject into the vein. Reported on 03/28/2015     No current facility-administered medications for this visit.   Facility-Administered Medications Ordered in Other Visits  Medication Dose Route Frequency Provider Last Rate Last Dose  . sodium chloride 0.9 % injection 10 mL  10 mL Intracatheter PRN Baird Cancer, PA-C  10 mL at 08/04/14 1103    Patient confirms/reports the following allergies:  Allergies  Allergen Reactions  . Codeine Other (See Comments)    Severe headache  . Morphine And Related Other (See Comments)    Makes him feel weird    No orders of the defined types were placed in this encounter.    AUTHORIZATION INFORMATION Primary Insurance:   ID #:   Group #:  Pre-Cert / Auth required:  Pre-Cert / Auth #:   Secondary Insurance:   ID #:   Group #:  Pre-Cert / Auth required: Pre-Cert / Auth #:   SCHEDULE  INFORMATION: Procedure has been scheduled as follows:  Date:  04/19/2015               Time: 1:30 PM  Location: [facility] Short Stay  This Gastroenterology Pre-Precedure Review Form is being routed to the following provider(s): Barney Drain, MD

## 2015-03-29 ENCOUNTER — Other Ambulatory Visit: Payer: Self-pay

## 2015-03-29 DIAGNOSIS — Z1211 Encounter for screening for malignant neoplasm of colon: Secondary | ICD-10-CM

## 2015-03-29 NOTE — Telephone Encounter (Signed)
I have called and informed pt's daughter. She wants me to hold off sending his prescription and instructions for now. She is trying to check with her doctor to see if he can have colonoscopy prior to 04/19/2015.

## 2015-03-29 NOTE — Telephone Encounter (Signed)
I called daughter and have sent a message to DR. Fields that they would like an earlier appt for her dad.

## 2015-03-29 NOTE — Telephone Encounter (Signed)
See separate phone call, I talked to her.

## 2015-03-29 NOTE — Telephone Encounter (Signed)
SUPREP SPLIT DOSING- CLEAR LIQUIDS WITH BREAKFAST.  

## 2015-03-29 NOTE — Telephone Encounter (Signed)
Holding off of sending instructions for the pt until his daughter checks to see if she can get him an earlier appt with her doctor.

## 2015-03-29 NOTE — Telephone Encounter (Signed)
PLEASE CALL PT'S DAUGHTER. WE DO NOT HAVE ANY APPOINTMENTS PRIOR TO MAR 16 BECAUSE I AM ON VACATION.

## 2015-03-30 NOTE — Telephone Encounter (Signed)
I have spoke to Neil Crouch, PA about this pt. We will bring him in for an office visit due to abnormal sigmoid colon on CT.  PT's daughter, Hinton Dyer, is aware of the appt on 04/02/2015 at 10:00 AM with Neil Crouch, PA.  She said either she or her step mom will come with the pt. She is aware we are keeping his appt for the colonoscopy on 04/19/2015 at 1:30 PM with Dr. Oneida Alar.

## 2015-03-30 NOTE — Telephone Encounter (Signed)
See separate note. Pt has OV appt on 04/02/2015 at 10:00 AM with Neil Crouch, PA.

## 2015-04-02 ENCOUNTER — Other Ambulatory Visit: Payer: Self-pay

## 2015-04-02 ENCOUNTER — Ambulatory Visit (INDEPENDENT_AMBULATORY_CARE_PROVIDER_SITE_OTHER): Payer: Medicare Other | Admitting: Gastroenterology

## 2015-04-02 ENCOUNTER — Encounter: Payer: Self-pay | Admitting: Gastroenterology

## 2015-04-02 VITALS — BP 133/75 | HR 101 | Temp 97.0°F | Ht 68.0 in | Wt 235.8 lb

## 2015-04-02 DIAGNOSIS — R9389 Abnormal findings on diagnostic imaging of other specified body structures: Secondary | ICD-10-CM

## 2015-04-02 DIAGNOSIS — R933 Abnormal findings on diagnostic imaging of other parts of digestive tract: Secondary | ICD-10-CM | POA: Diagnosis not present

## 2015-04-02 DIAGNOSIS — R948 Abnormal results of function studies of other organs and systems: Secondary | ICD-10-CM | POA: Diagnosis not present

## 2015-04-02 MED ORDER — PEG-KCL-NACL-NASULF-NA ASC-C 100 G PO SOLR: 1.0000 | ORAL | Status: DC

## 2015-04-02 NOTE — Progress Notes (Signed)
Primary Care Physician:  [name], MD  Primary Gastroenterologist:  Barney Drain, MD   Chief Complaint  Patient presents with  . set up TCS    HPI:  Dean [name] is a 80 y.o. male with multiple myeloma, monoclonal IgG With scattered osseous lesions who presents at the request of Dr. Whitney Muse for consideration of colonoscopy. Reportedly patient's myeloma is in remission. Patient has a history of complicated sigmoid diverticulitis with abscess. Patient never had pain. Incidentally noted to have acute diverticulitis with small diverticular abscess on the PET scan in November. He was treated with oral Cipro and Flagyl. Follow-up CT scan showed enlarging diverticular abscess with air-fluid level measuring 6.5 cm. Ultimately required percutaneous drainage on December 6. Drain was removed on December 20. Follow-up CT of the abdomen and pelvis with contrast on 02/22/2015 showed no evidence of diverticulitis or drainable fluid collection. Follow-up PET scan on January 27 showed continued high activity in the sigmoid colon along with fairly advanced sigmoid colon diverticulosis. Continued to be a small band of density with a small amount of gas density anterior to the sigmoid colon, potentially representing a tiny residual abscess or unusual diverticulum with surrounding chronic inflammatory findings. Appearance less striking than on prior exam however.  Patient has never had a colonoscopy. He denies any abdominal pain, in fact he says he never really had any. He complains of no taste for food since chemotherapy. Eats because he knows he has to. Chemotherapy had been interrupted due to complicated diverticulitis, next dose plan for him 04/16/2015. Patient denies pounds with bowel movements. Denies blood in the stool or melena. No upper GI symptoms. Does not take oxycodone for pain. Notes that he has taken [facility] powders intermittently, describes as rare use however for the past 2 weeks he's been taking  twice daily for various aches and pains.  Current Outpatient Prescriptions  Medication Sig Dispense Refill  . acyclovir (ZOVIRAX) 400 MG tablet Take 1 tablet (400 mg total) by mouth 2 (two) times daily. 60 tablet 3  . ALPRAZolam (XANAX) 1 MG tablet Take 1-2 tablets (1-2 mg total) by mouth at bedtime as needed for sleep. For anxiety and/or sleep 60 tablet 1  . aspirin EC 325 MG tablet Take 1 tablet (325 mg total) by mouth daily. 30 tablet 11  . Calcium Carb-Cholecalciferol (CALCIUM-VITAMIN D3) 600-500 MG-UNIT CAPS Take 1 tablet by mouth 2 (two) times daily. With food 60 capsule 6  . diphenhydrAMINE (BENADRYL) 25 MG tablet Take 25 mg by mouth at bedtime as needed for sleep.    . furosemide (LASIX) 20 MG tablet Take 1 tablet (20 mg total) by mouth daily. 30 tablet 3  . gabapentin (NEURONTIN) 400 MG capsule Take 1 capsule (400 mg total) by mouth 4 (four) times daily. (Patient taking differently: Take 400 mg by mouth 2 (two) times daily as needed (pain). ) 120 capsule 2  . omeprazole (PRILOSEC) 20 MG capsule Take 1 capsule (20 mg total) by mouth daily. (Patient taking differently: Take 20 mg by mouth daily. As needed) 30 capsule 5  . oxyCODONE-acetaminophen (PERCOCET) 10-325 MG tablet Take 1 tablet by mouth every 4 (four) hours as needed for pain. 60 tablet 0  . tamsulosin (FLOMAX) 0.4 MG CAPS capsule Take 1 capsule (0.4 mg total) by mouth at bedtime. 30 capsule 3  . UNABLE TO FIND Normal Saline Flush - 10ML BID 60 Syringe 1   No current facility-administered medications for this visit.   Facility-Administered Medications Ordered in Other  Visits  Medication Dose Route Frequency Provider Last Rate Last Dose  . sodium chloride 0.9 % injection 10 mL  10 mL Intracatheter PRN Baird Cancer, PA-C   10 mL at 08/04/14 1103    Allergies as of 04/02/2015 - Review Complete 04/02/2015  Allergen Reaction Noted  . Codeine Other (See Comments) 11/02/2012  . Morphine and related Other (See Comments)  01/11/2013    Past Medical History  Diagnosis Date  . Neuropathy ([place])   . BPH (benign prostatic hyperplasia)   . GERD (gastroesophageal reflux disease)   . H/O ETOH abuse   . Skull lesion     Left occipital condyle  . History of radiation therapy 01/05/13-02/10/13    45 gray to left occipital condyle region  . Multiple myeloma ([place])   . Radiation 02/21/14-03/08/14    right posterior chest wall area 30 gray  . Radiation 04/19/14-05/02/14    lumbar spine 25 gray  . Diverticulitis 01/21/74    complicated by abscess and required percutaneous drainage    Past Surgical History  Procedure Laterality Date  . Cyst excision  1959    tail bone    Family History  Problem Relation Age of Onset  . Diabetes Mother   . Stroke Mother   . Hypertension Father     Social History   Social History  . Marital Status: Married    Spouse Name: N/A  . Number of Children: 3  . Years of Education: N/A   Occupational History  .     Social History Main Topics  . Smoking status: Never Smoker   . Smokeless tobacco: Never Used  . Alcohol Use: No     Comment: " Not much no more"  . Drug Use: No  . Sexual Activity: Not on file   Other Topics Concern  . Not on file   Social History Narrative      ROS:  General: Negative for anorexia, weight loss, fever, chills, fatigue, weakness. Eyes: Negative for vision changes.  ENT: Negative for hoarseness, difficulty swallowing , nasal congestion. CV: Negative for chest pain, angina, palpitations, dyspnea on exertion, peripheral edema.  Respiratory: Negative for dyspnea at rest, dyspnea on exertion, cough, sputum, wheezing.  GI: See history of present illness. GU:  Negative for dysuria, hematuria, urinary incontinence, urinary frequency, nocturnal urination.  MS: +for joint pain and low back pain.  Derm: Negative for rash or itching.  Neuro: Negative for weakness, abnormal sensation, seizure, frequent headaches, memory loss, confusion.  Psych:  Negative for anxiety, depression, suicidal ideation, hallucinations.  Endo: Negative for unusual weight change.  Heme: Negative for bruising or bleeding. Allergy: Negative for rash or hives.    Physical Examination:  BP 133/75 mmHg  Pulse 101  Temp(Src) 97 F (36.1 C)  Ht 5' 8"  (1.727 m)  Wt 235 lb 12.8 oz (106.958 kg)  BMI 35.86 kg/m2   General: Well-nourished, well-developed in no acute distress. Accompanied by daughter. Head: Normocephalic, atraumatic.   Eyes: Conjunctiva pink, no icterus. Mouth: Oropharyngeal mucosa moist and pink , no lesions erythema or exudate. Neck: Supple without thyromegaly, masses, or lymphadenopathy.  Lungs: Clear to auscultation bilaterally.  Heart: Regular rate and rhythm, no murmurs rubs or gallops.  Abdomen: Bowel sounds are normal, nontender, nondistended, no hepatosplenomegaly or masses, no abdominal bruits or    hernia , no rebound or guarding.   Rectal: not performed Extremities: No lower extremity edema. No clubbing or deformities.  Neuro: Alert and oriented x 4 , grossly  normal neurologically.  Skin: Warm and dry, no rash or jaundice.   Psych: Alert and cooperative, normal mood and affect.  Labs: Lab Results  Component Value Date   CREATININE 0.68 03/19/2015   BUN 15 03/19/2015   NA 143 03/19/2015   K 4.2 03/19/2015   CL 109 03/19/2015   CO2 27 03/19/2015   Lab Results  Component Value Date   ALT 11* 03/19/2015   AST 17 03/19/2015   ALKPHOS 24* 03/19/2015   BILITOT 0.4 03/19/2015   Lab Results  Component Value Date   WBC 5.9 02/19/2015   HGB 14.1 02/19/2015   HCT 42.2 02/19/2015   MCV 93.4 02/19/2015   PLT 166 02/19/2015     Imaging Studies: No results found.

## 2015-04-02 NOTE — Patient Instructions (Signed)
1. Colonoscopy with Dr. Oneida Alar as scheduled. See separate instructions. 2. No Aspirin powders for one week prior to procedure.  3. Consider stopping all aspirin powders due to risk of bleeding/ulcers.

## 2015-04-04 ENCOUNTER — Encounter: Payer: Self-pay | Admitting: Gastroenterology

## 2015-04-04 NOTE — Progress Notes (Signed)
cc'ed to pcp °

## 2015-04-04 NOTE — Assessment & Plan Note (Signed)
80 year old gentleman with history of multiple myeloma currently in remission who presents at the request of Dr. Whitney Muse for consideration of colonoscopy. Patient has a history of complicated diverticulitis requiring percutaneous drainage of abscess back in December. Never had pain, findings incidentally noted on a PET scan. CT scan back on January 19 status post treatment/drainage of abscess showed resolution. PET scan January 27th continues to show high activity in the sigmoid colon area. Patient has never had a colonoscopy. Clinically he is asymptomatic. Recommend colonoscopy at this time for further evaluation of findings, exclude underlying malignancy.  I have discussed the risks, alternatives, benefits with regards to but not limited to the risk of reaction to medication, bleeding, infection, perforation and the patient is agreeable to proceed. Written consent to be obtained.  Patient advised need to avoid all ASA powder use started at least one week before TCS but in general recommend avoiding due to risk of GI bleeding.

## 2015-04-05 ENCOUNTER — Other Ambulatory Visit (HOSPITAL_COMMUNITY): Payer: Self-pay | Admitting: Oncology

## 2015-04-05 ENCOUNTER — Telehealth (HOSPITAL_COMMUNITY): Payer: Self-pay | Admitting: *Deleted

## 2015-04-05 DIAGNOSIS — G629 Polyneuropathy, unspecified: Secondary | ICD-10-CM

## 2015-04-05 DIAGNOSIS — C9000 Multiple myeloma not having achieved remission: Secondary | ICD-10-CM

## 2015-04-05 MED ORDER — GABAPENTIN 400 MG PO CAPS: 400.0000 mg | ORAL_CAPSULE | Freq: Four times a day (QID) | ORAL | Status: DC

## 2015-04-16 ENCOUNTER — Encounter (HOSPITAL_COMMUNITY): Payer: Medicare Other | Attending: Hematology and Oncology | Admitting: Hematology & Oncology

## 2015-04-16 ENCOUNTER — Encounter (HOSPITAL_COMMUNITY): Payer: Medicare Other

## 2015-04-16 ENCOUNTER — Encounter (HOSPITAL_BASED_OUTPATIENT_CLINIC_OR_DEPARTMENT_OTHER): Payer: Medicare Other

## 2015-04-16 ENCOUNTER — Encounter (HOSPITAL_COMMUNITY): Payer: Self-pay | Admitting: Hematology & Oncology

## 2015-04-16 VITALS — BP 128/82 | HR 118 | Temp 99.0°F | Resp 18 | Wt 231.0 lb

## 2015-04-16 DIAGNOSIS — R630 Anorexia: Secondary | ICD-10-CM | POA: Diagnosis not present

## 2015-04-16 DIAGNOSIS — R634 Abnormal weight loss: Secondary | ICD-10-CM | POA: Insufficient documentation

## 2015-04-16 DIAGNOSIS — Z923 Personal history of irradiation: Secondary | ICD-10-CM | POA: Insufficient documentation

## 2015-04-16 DIAGNOSIS — K219 Gastro-esophageal reflux disease without esophagitis: Secondary | ICD-10-CM | POA: Insufficient documentation

## 2015-04-16 DIAGNOSIS — C9000 Multiple myeloma not having achieved remission: Secondary | ICD-10-CM | POA: Diagnosis not present

## 2015-04-16 DIAGNOSIS — K5730 Diverticulosis of large intestine without perforation or abscess without bleeding: Secondary | ICD-10-CM | POA: Diagnosis not present

## 2015-04-16 DIAGNOSIS — M899 Disorder of bone, unspecified: Secondary | ICD-10-CM | POA: Diagnosis not present

## 2015-04-16 DIAGNOSIS — N400 Enlarged prostate without lower urinary tract symptoms: Secondary | ICD-10-CM | POA: Insufficient documentation

## 2015-04-16 DIAGNOSIS — M542 Cervicalgia: Secondary | ICD-10-CM | POA: Diagnosis not present

## 2015-04-16 DIAGNOSIS — G4700 Insomnia, unspecified: Secondary | ICD-10-CM | POA: Diagnosis not present

## 2015-04-16 DIAGNOSIS — D472 Monoclonal gammopathy: Secondary | ICD-10-CM | POA: Insufficient documentation

## 2015-04-16 LAB — COMPREHENSIVE METABOLIC PANEL
ALT: 12 U/L — ABNORMAL LOW (ref 17–63)
AST: 21 U/L (ref 15–41)
Albumin: 3.8 g/dL (ref 3.5–5.0)
Alkaline Phosphatase: 21 U/L — ABNORMAL LOW (ref 38–126)
Anion gap: 8 (ref 5–15)
BUN: 21 mg/dL — ABNORMAL HIGH (ref 6–20)
CO2: 25 mmol/L (ref 22–32)
Calcium: 9.1 mg/dL (ref 8.9–10.3)
Chloride: 104 mmol/L (ref 101–111)
Creatinine, Ser: 1.12 mg/dL (ref 0.61–1.24)
GFR calc Af Amer: >60 mL/min (ref >60)
GFR calc non Af Amer: >60 mL/min (ref >60)
Glucose, Bld: 110 mg/dL — ABNORMAL HIGH (ref 65–99)
Potassium: 4.3 mmol/L (ref 3.5–5.1)
Sodium: 137 mmol/L (ref 135–145)
Total Bilirubin: 0.7 mg/dL (ref 0.3–1.2)
Total Protein: 7.1 g/dL (ref 6.5–8.1)

## 2015-04-16 MED ORDER — SODIUM CHLORIDE 0.9 % IV SOLN
Freq: Once | INTRAVENOUS | Status: AC
  Administered 2015-04-16: 11:00:00 via INTRAVENOUS

## 2015-04-16 MED ORDER — SODIUM CHLORIDE 0.9 % IJ SOLN
10.0000 mL | INTRAMUSCULAR | Status: DC | PRN
  Administered 2015-04-16: 10 mL
  Filled 2015-04-16: qty 10

## 2015-04-16 MED ORDER — ZOLEDRONIC ACID 4 MG/5ML IV CONC
4.0000 mg | Freq: Once | INTRAVENOUS | Status: AC
  Administered 2015-04-16: 4 mg via INTRAVENOUS
  Filled 2015-04-16: qty 5

## 2015-04-16 NOTE — Progress Notes (Signed)
Dean Fraction, MD [address] [place] [address]  Multiple Myeloma, IgG kappa  Left occipital condyle infiltrative lesion, not amenable to biopsy. Palliative XRT without a tissue diagnosis  Chest x-ray and right rib films were obtained which showed a soft tissue mass along the right posterior fifth rib with some rib destruction.  Monoclonal IgG kappa, normal IgG levels, suppressed IgM, abnormal kappa/lamda ratio  PET/CT  03/16/2014 Scattered hypermetabolic osseous lesions, including the calvarium, ribs, left scapula, sacrum, and left femoral shaft. An expansile left vertebral body lesion at L3 extends into the epidural space of the left lateral recess, and probably merits lumbar spine MRI assessment.  BMBX on 02/20/2014 with 33% kappa restricted plasma cells Normal FISH, normal cytogenetics  Peripheral evaluation shows very small M spike, normal CBC, normal CMP, myeloma survey grossly underestimates extent of bone involvement    Multiple myeloma without remission ([place])   11/16/2012 Initial Diagnosis L occipital condyle destructive lesion, not biopsied secondary to location. XRT   12/01/2012 Imaging L3 superior endplate compression FX, no lytic lesions to suggest myeloma   02/06/2014 Imaging soft tissue mass along the right posterior fifth rib with some rib destruction   02/16/2014 Miscellaneous Normal CBC, Normal CMP, kappa,lamda ratio of 2.62 (abnormal), UIEP with slightly restricted band, IgG kappa, SPEP/IEP with monoclonal protein at 0.79 g/dl, normal igG, suppressed IGM   02/20/2014 Bone Marrow Biopsy 33% kappa restricted plasma cells Normal FISH, normal cytogenetics   02/21/2014 - 03/08/2014 Radiation Therapy 30Gy to R rib lesion, lesion not biopsied   03/16/2014 PET scan Scattered hypermetabolic osseous lesions, including the calvarium, ribs, left scapula, sacrum, and left femoral shaft. An expansile left vertebral body lesion at L3 extends into the epidural  space of the left lateral recess,    03/17/2014 - 04/07/2014 Chemotherapy Velcade and Dexamethasone due to initial denial of Revlimid by insurance company.  Zometa monthly.   03/22/2014 Imaging MR_ L-Spine- Enhancing lesions compatible with multiple myeloma at L2, L3, and S1-2.   04/07/2014 - 07/28/2014 Chemotherapy RVD with Revlimid at 25 mg days 1-14.  Revlimid dose reduced to 25 mg every other day x 14 days with 7 day respite beginning on day 1 of cycle 2.   04/17/2014 Adverse Reaction Revlimid-induced rash.  Treated with steroids.   07/28/2014 - 08/04/2014 Chemotherapy Revlimid daily   08/04/2014 Adverse Reaction Velcade-induced peripheral neuropathy   08/04/2014 Treatment Plan Change D/C Velcade   08/11/2014 PET scan Response to therapy. Improvement and resolution of foci of osseous hypermetabolism.   08/22/2014 - 08/28/2014 Chemotherapy Revlimid 25 mg days 1-21 every 28 days   08/28/2014 Adverse Reaction Revlimid-induced rash. Medication held.  Medrol dose Pak prescribed.   09/04/2014 - 01/08/2015 Chemotherapy Revlimid 10 mg every other day, without dexamethasone   01/08/2015 - 01/15/2015 Hospital Admission Colonic diverticular abscess with IR drain placement   03/02/2015 PET scan Only bony uptake is low activity at site of deformity/callus at R second rib FX, high activity in sigmoid colon, advanced sigmoid divertidulosis. Abscess not resolved?    CURRENT THERAPY: Zometa  INTERVAL HISTORY: Dean [name] 80 y.o. male returns for followup of multiple myeloma, monoclonal IgG kappa with scattered osseous lesions. He has sigmoid diverticulosis and has undergone treatment for a diverticular abscess. He has been very reluctant to undergo additional evaluation and work-up of his sigmoid colon.  Dean [name] was here alone today.  He is having a colonoscopy with Dr. Oneida Alar this Thursday (04/19/15).  His daughter is going to take him to it. He is worried about this procedure.    He stated that he was eating. He had  diarrhea today. He says that he is semi constipated; most of the time however he is not.   He said that he is fatigued.  He asked if his Acyclovir was making his joints sore.   He said that he does not need his pain medication and that he tries not to take it. He denies SOB, CP or abdominal pain, nausea, vomiting.  Past Medical History  Diagnosis Date  . Neuropathy ([place])   . BPH (benign prostatic hyperplasia)   . GERD (gastroesophageal reflux disease)   . H/O ETOH abuse   . Skull lesion     Left occipital condyle  . History of radiation therapy 01/05/13-02/10/13    45 gray to left occipital condyle region  . Multiple myeloma ([place])   . Radiation 02/21/14-03/08/14    right posterior chest wall area 30 gray  . Radiation 04/19/14-05/02/14    lumbar spine 25 gray  . Diverticulitis 08/67/61    complicated by abscess and required percutaneous drainage    has Neuropathy ([place]); BPH (benign prostatic hyperplasia); GERD (gastroesophageal reflux disease); H/O ETOH abuse; Skull lesion; Neck pain; Fatigue; Loss of weight; Insomnia; Allergic rhinitis; Lumbar compression fracture ([place]); Dermatitis; Multiple myeloma without remission ([place]); Colonic diverticular abscess; Thrombocytopenia ([place]); Intra-abdominal abscess ([place]); Abnormal CT scan, sigmoid colon; and Abnormal PET scan of colon on his problem list.     is allergic to codeine and morphine and related.  Dean [name] had no medications administered during this visit.  Past Surgical History  Procedure Laterality Date  . Cyst excision  1959    tail bone  Positive for diarrhea, constipation, fatigue and nervous/anxious.  Denies any headaches, dizziness, double vision, fevers, chills, night sweats, trouble sleeping, nausea, vomiting, chest pain, heart palpitations, shortness of breath, blood in stool, black tarry stool, urinary pain, urinary burning, urinary frequency, hematuria.  14 point review of systems was performed and is negative except as  detailed under history of present illness and above  PHYSICAL EXAMINATION  ECOG PERFORMANCE STATUS: 0 - Asymptomatic   Filed Vitals:   04/16/15 0855  BP: 128/82  Pulse: 118  Temp: 99 F (37.2 C)  Resp: 18    GENERAL:alert, no distress, well nourished, well developed, comfortable, cooperative, obese and smiling SKIN: skin color, texture, turgor are normal, no rashes or significant lesions Needed help to get on exam table HEAD: Normocephalic, No masses, lesions, tenderness or abnormalities EYES: normal, PERRLA, EOMI, Conjunctiva are pink and non-injected. Wears dark sunglasses all the time EARS: External ears normal OROPHARYNX:lips, buccal mucosa, and tongue normal and mucous membranes are moist  NECK: supple, no adenopathy, thyroid normal size, non-tender, without nodularity, no stridor, non-tender, trachea midline LYMPH:  no palpable lymphadenopathy BREAST:not examined LUNGS: clear to auscultation  HEART: regular rate & rhythm, no murmurs and no gallops ABDOMEN:abdomen soft, non-tender, obese and normal bowel sounds BACK: Back symmetric, no curvature., No CVA tenderness EXTREMITIES:less then 2 second capillary refill, no joint deformities, effusion, or inflammation, no edema, no skin discoloration, no clubbing, no cyanosis  NEURO: alert & oriented x 3 with fluent speech, no focal motor/sensory deficits, gait normal   LABORATORY DATA: I have reviewed the labs below. CBC    Component Value Date/Time   WBC 5.9 02/19/2015 1029   RBC 4.52 02/19/2015 1029   HGB 14.1 02/19/2015 1029   HCT 42.2 02/19/2015  1029   PLT 166 02/19/2015 1029   MCV 93.4 02/19/2015 1029   MCH 31.2 02/19/2015 1029   MCHC 33.4 02/19/2015 1029   RDW 14.0 02/19/2015 1029   LYMPHSABS 1.2 02/19/2015 1029   MONOABS 0.6 02/19/2015 1029   EOSABS 0.0 02/19/2015 1029   BASOSABS 0.0 02/19/2015 1029      Chemistry      Component Value Date/Time   NA 137 04/16/2015 0827   K 4.3 04/16/2015 0827   CL 104  04/16/2015 0827   CO2 25 04/16/2015 0827   BUN 21* 04/16/2015 0827   CREATININE 1.12 04/16/2015 0827   CREATININE 0.68* 02/16/2015 1027      Component Value Date/Time   CALCIUM 9.1 04/16/2015 0827   ALKPHOS 21* 04/16/2015 0827   AST 21 04/16/2015 0827   ALT 12* 04/16/2015 0827   BILITOT 0.7 04/16/2015 0827      RADIOGRAPHY: I have personally reviewed the radiological images as listed and agreed with the findings in the report.  Study Result     CLINICAL DATA: Subsequent treatment strategy for multiple myeloma.  EXAM: NUCLEAR MEDICINE PET WHOLE BODY  TECHNIQUE: 11.7 mCi F-18 FDG was injected intravenously. Full-ring PET imaging was performed from the vertex to the feet after the radiotracer. CT data was obtained and used for attenuation correction and anatomic localization.  FASTING BLOOD GLUCOSE: Value: 79 mg/dl  COMPARISON: 12/22/2014  FINDINGS: Head/Neck: High activity in the scalp, and in the nose, likely physiologic. No adenopathy in the neck or significant worrisome bony lesions. Grossly normal intracranial activity in the brain.  Chest: There is high activity in a mid thoracic intervertebral spurring anteriorly, maximum standard uptake value 10.4, probably inflammatory given the presence of this finding in a spur.  Coronary, aortic arch, and branch vessel atherosclerotic vascular disease. There is evidence of old granulomatous disease. Scarring posteriorly in the right upper lobe similar to prior.  Abdomen/Pelvis: Sigmoid diverticulosis with high activity in the sigmoid colon. There continues to be a small band of density with a small amount of gas density anterior to the sigmoid colon, potentially representing a tiny residual abscess or an unusual diverticulum with surrounding chronic inflammatory findings. The appearance is less striking than on the prior exam but is still visible on images 192-193 of series 4.  Prominent prostate gland, 6.0  by 5.1 cm on image 219 series 4.  Skeleton: Faintly increased uptake laterally at the deformity/callus in the right second rib, maximum standard uptake value 5.4, no change in the CT appearance. There is deformity of the right fifth rib with some expansile component but without hypermetabolic activity posterolaterally, CT abnormality shown on image 91 series for and not appreciably changed in morphology. No lesions in the visualized appendicular skeleton. No hypermetabolic activity at the L3 lesion, the right sacral lesion, or any of the other prior identified lesions.  Extremities: No hypermetabolic activity to suggest metastasis.  IMPRESSION: 1. The only currently active bony uptake is low grade activity at the site of the deformity/callus at the right second rib fracture. This may have been a pathologic fracture originally. There is some callus formation and sclerosis along with some central lucency at this site. Maximum standard uptake value is 5.4. None of the other prior bony lesions demonstrate any significant hypermetabolic activity. 2. There is some high activity in a mid thoracic intervertebral spur which is highly likely to be inflammatory rather than neoplastic. 3. There continues to be high activity in the sigmoid colon along with fairly  advanced sigmoid colon diverticulosis. If the patient has not had recent colonoscopy thin it may be prudent to ensure lack of tumor in the sigmoid colon. Unusual diverticulum or small abscess anterior to the sigmoid colon is reduced in size and prominence but not completely resolved, with a tiny amount of potentially extra locular gas detailed above.   Electronically Signed  By: Van Clines M.D.  On: 03/02/2015 15:14    No results found.   ASSESSMENT AND PLAN:  IgG kappa myeloma Multiple bone lesions seen on PET imaging RVD therapy Rash on revlimid, currently on every other day  dosing Insomnia Hypocalcemia Diverticulitis/peridiverticular abscess seen on PET imaging, patient asymptomatic IR drain placement of diverticular abscess Sigmoid colon diverticulosis   He is going to have a colonoscopy with Dr. Oneida Alar this Thursday (04/19/15). I have discussed with him being open to any additional evaluation that may be recommended after his C-scope.  His PET/CT was still suggestive of a potentially small diverticular abscess, he remains suprisingly asymptomatic.   He will return in 4 weeks after his colonoscopy. All therapy for his myeloma is currently on hold. Last PET did not show active bone disease.  He continues on zometa, calcium permitting.  He was advised he could discontinue his acyclovir.   All questions were answered. The patient knows to call the clinic with any problems, questions or concerns. We can certainly see the patient much sooner if necessary.   This note is electronically signed.  This document serves as a record of services personally performed by Ancil Linsey, MD. It was created on her behalf by Kandace Blitz, a trained medical scribe. The creation of this record is based on the scribe's personal observations and the provider's statements to them. This document has been checked and approved by the attending provider.  I have reviewed the above documentation for accuracy and completeness, and I agree with the above.  Dean Fam. Chiyoko Torrico MD

## 2015-04-16 NOTE — Patient Instructions (Addendum)
[place] at [facility] - Inpatient Discharge Instructions  RECOMMENDATIONS MADE BY THE CONSULTANT AND ANY TEST RESULTS WILL BE SENT TO YOUR REFERRING PHYSICIAN.   Exam and discussion by Dr Whitney Muse today Part of your colon is sick, be open to what the surgeon wants to do. Dont take aspirin or BC powders before your colonoscopy  You dont need to take your acyclovir right now because you are not on treatment right now  Return to see the doctor in 4 weeks  With labs after your colonoscopy  Please call the clinic if you have any questions or concerns    Thank you for choosing [place] at [facility] to provide your oncology and hematology care.  To afford each patient quality time with our provider, please arrive at least 15 minutes before your scheduled appointment time.   Beginning January 23rd 2017 lab work for the [facility] will be done in the  Main lab at [facility] on 1st floor. If you have a lab appointment with the [facility] please come in thru the  Main Entrance and check in at the main information desk  You need to re-schedule your appointment should you arrive 10 or more minutes late.  We strive to give you quality time with our providers, and arriving late affects you and other patients whose appointments are after yours.  Also, if you no show three or more times for appointments you may be dismissed from the clinic at the providers discretion.     Again, thank you for choosing [facility].  Our hope is that these requests will decrease the amount of time that you wait before being seen by our physicians.       _____________________________________________________________  Should you have questions after your visit to [facility], please contact our office at [phone] between the hours of 8:30 a.m. and 4:30 p.m.  Voicemails left after 4:30 p.m. will not be returned until the following business day.  For  prescription refill requests, have your pharmacy contact our office.         Resources For Cancer Patients and their Caregivers ? American Cancer Society: Can assist with transportation, wigs, general needs, runs Look Good Feel Better.        431-260-2013 ? Cancer Care: Provides financial assistance, online support groups, medication/co-pay assistance.  1-800-813-HOPE 986-383-5998) ? [facility] Assists [place] cancer patients and their families through emotional , educational and financial support.  801-849-1433 ? [place] DSS Where to apply for food stamps, Medicaid and utility assistance. 517 850 5847 ? [facility]: Transportation to medical appointments. 440-790-1400 ? Social Security Administration: May apply for disability if have a Stage IV cancer. 9738679941 3250517688 ? [employer], Disability and Transit Services: Assists with nutrition, care and transit needs. 575-495-1394

## 2015-04-16 NOTE — Progress Notes (Signed)
Tolerated infusion well. Ambulatory on discharge home to self. 

## 2015-04-19 ENCOUNTER — Ambulatory Visit (HOSPITAL_COMMUNITY)
Admission: RE | Admit: 2015-04-19 | Discharge: 2015-04-19 | Disposition: A | Discharge status: Home / Self Care | Payer: Medicare Other | Source: Ambulatory Visit | Attending: Gastroenterology | Admitting: Gastroenterology

## 2015-04-19 ENCOUNTER — Telehealth: Payer: Self-pay | Admitting: Gastroenterology

## 2015-04-19 ENCOUNTER — Encounter (HOSPITAL_COMMUNITY): Payer: Self-pay | Admitting: *Deleted

## 2015-04-19 ENCOUNTER — Encounter (HOSPITAL_COMMUNITY)
Admission: RE | Disposition: A | Discharge status: Home / Self Care | Payer: Self-pay | Source: Ambulatory Visit | Attending: Gastroenterology

## 2015-04-19 DIAGNOSIS — Z7982 Long term (current) use of aspirin: Secondary | ICD-10-CM | POA: Insufficient documentation

## 2015-04-19 DIAGNOSIS — G629 Polyneuropathy, unspecified: Secondary | ICD-10-CM | POA: Diagnosis not present

## 2015-04-19 DIAGNOSIS — Z923 Personal history of irradiation: Secondary | ICD-10-CM | POA: Insufficient documentation

## 2015-04-19 DIAGNOSIS — K644 Residual hemorrhoidal skin tags: Secondary | ICD-10-CM | POA: Diagnosis not present

## 2015-04-19 DIAGNOSIS — K219 Gastro-esophageal reflux disease without esophagitis: Secondary | ICD-10-CM | POA: Diagnosis not present

## 2015-04-19 DIAGNOSIS — Z79899 Other long term (current) drug therapy: Secondary | ICD-10-CM | POA: Diagnosis not present

## 2015-04-19 DIAGNOSIS — K5730 Diverticulosis of large intestine without perforation or abscess without bleeding: Secondary | ICD-10-CM

## 2015-04-19 DIAGNOSIS — Z1211 Encounter for screening for malignant neoplasm of colon: Secondary | ICD-10-CM

## 2015-04-19 DIAGNOSIS — C9000 Multiple myeloma not having achieved remission: Secondary | ICD-10-CM | POA: Insufficient documentation

## 2015-04-19 DIAGNOSIS — R933 Abnormal findings on diagnostic imaging of other parts of digestive tract: Secondary | ICD-10-CM | POA: Diagnosis not present

## 2015-04-19 DIAGNOSIS — Z7189 Other specified counseling: Secondary | ICD-10-CM | POA: Insufficient documentation

## 2015-04-19 HISTORY — PX: COLONOSCOPY: SHX5424

## 2015-04-19 SURGERY — COLONOSCOPY
Anesthesia: Moderate Sedation

## 2015-04-19 MED ORDER — SODIUM CHLORIDE 0.9 % IV SOLN
INTRAVENOUS | Status: DC
  Administered 2015-04-19: 13:00:00 via INTRAVENOUS

## 2015-04-19 MED ORDER — SIMETHICONE 40 MG/0.6ML PO SUSP
ORAL | Status: DC | PRN
  Administered 2015-04-19: 2.5 mL

## 2015-04-19 MED ORDER — MIDAZOLAM HCL 5 MG/5ML IJ SOLN
INTRAMUSCULAR | Status: DC | PRN
  Administered 2015-04-19 (×2): 2 mg via INTRAVENOUS
  Administered 2015-04-19 (×3): 1 mg via INTRAVENOUS

## 2015-04-19 MED ORDER — MIDAZOLAM HCL 5 MG/5ML IJ SOLN
INTRAMUSCULAR | Status: AC
  Filled 2015-04-19: qty 10

## 2015-04-19 MED ORDER — MEPERIDINE HCL 100 MG/ML IJ SOLN
INTRAMUSCULAR | Status: DC | PRN
  Administered 2015-04-19 (×2): 25 mg via INTRAVENOUS
  Administered 2015-04-19: 50 mg

## 2015-04-19 MED ORDER — MEPERIDINE HCL 100 MG/ML IJ SOLN
INTRAMUSCULAR | Status: AC
  Filled 2015-04-19: qty 2

## 2015-04-19 NOTE — Telephone Encounter (Signed)
Pt had complicated diverticulitis  DEC 2016 refer to Dr. Arnoldo Morale To discuss Kimberly 2 WEEKS.

## 2015-04-19 NOTE — Discharge Instructions (Signed)
You have LARGE internal hemorrhoids and diverticulosis IN YOUR RIGHT AND LEFT COLON. NO MASS WAS SEEN IN YOUR SIGMOID COLON.   DRINK WATER TO KEEP YOUR URINE LIGHT YELLOW.  FOLLOW A HIGH FIBER DIET. AVOID ITEMS THAT CAUSE BLOATING & GAS. SEE INFO BELOW.  YOU SHOULD TALK TO A SURGEON ABOUT THE BENEFITS V. RISKS OF PARTIAL COLECTOMY.    Colonoscopy Care After Read the instructions outlined below and refer to this sheet in the next week. These discharge instructions provide you with general information on caring for yourself after you leave the hospital. While your treatment has been planned according to the most current medical practices available, unavoidable complications occasionally occur. If you have any problems or questions after discharge, call DR. Patina Spanier, [phone].  ACTIVITY  You may resume your regular activity, but move at a slower pace for the next 24 hours.   Take frequent rest periods for the next 24 hours.   Walking will help get rid of the air and reduce the bloated feeling in your belly (abdomen).   No driving for 24 hours (because of the medicine (anesthesia) used during the test).   You may shower.   Do not sign any important legal documents or operate any machinery for 24 hours (because of the anesthesia used during the test).    NUTRITION  Drink plenty of fluids.   You may resume your normal diet as instructed by your doctor.   Begin with a light meal and progress to your normal diet. Heavy or fried foods are harder to digest and may make you feel sick to your stomach (nauseated).   Avoid alcoholic beverages for 24 hours or as instructed.    MEDICATIONS  You may resume your normal medications.   WHAT YOU CAN EXPECT TODAY  Some feelings of bloating in the abdomen.   Passage of more gas than usual.   Spotting of blood in your stool or on the toilet paper  .  IF YOU HAD POLYPS REMOVED DURING THE COLONOSCOPY:  Eat a soft diet IF YOU HAVE  NAUSEA, BLOATING, ABDOMINAL PAIN, OR VOMITING.    FINDING OUT THE RESULTS OF YOUR TEST Not all test results are available during your visit. DR. Oneida Alar WILL CALL YOU WITHIN 7 DAYS OF YOUR PROCEDUE WITH YOUR RESULTS. Do not assume everything is normal if you have not heard from DR. Swannie Milius IN ONE WEEK, CALL HER OFFICE AT [phone].  SEEK IMMEDIATE MEDICAL ATTENTION AND CALL THE OFFICE: [phone] IF:  You have more than a spotting of blood in your stool.   Your belly is swollen (abdominal distention).   You are nauseated or vomiting.   You have a temperature over 101F.   You have abdominal pain or discomfort that is severe or gets worse throughout the day.  High-Fiber Diet A high-fiber diet changes your normal diet to include more whole grains, legumes, fruits, and vegetables. Changes in the diet involve replacing refined carbohydrates with unrefined foods. The calorie level of the diet is essentially unchanged. The Dietary Reference Intake (recommended amount) for adult males is 38 grams per day. For adult females, it is 25 grams per day. Pregnant and lactating women should consume 28 grams of fiber per day. Fiber is the intact part of a plant that is not broken down during digestion. Functional fiber is fiber that has been isolated from the plant to provide a beneficial effect in the body. PURPOSE  Increase stool bulk.   Ease and regulate bowel movements.  Lower cholesterol.  REDUCE RISK OF COLON CANCER  INDICATIONS THAT YOU NEED MORE FIBER  Constipation and hemorrhoids.   Uncomplicated diverticulosis (intestine condition) and irritable bowel syndrome.   Weight management.   As a protective measure against hardening of the arteries (atherosclerosis), diabetes, and cancer.   GUIDELINES FOR INCREASING FIBER IN THE DIET  Start adding fiber to the diet slowly. A gradual increase of about 5 more grams (2 slices of whole-wheat bread, 2 servings of most fruits or vegetables,  or 1 bowl of high-fiber cereal) per day is best. Too rapid an increase in fiber may result in constipation, flatulence, and bloating.   Drink enough water and fluids to keep your urine clear or pale yellow. Water, juice, or caffeine-free drinks are recommended. Not drinking enough fluid may cause constipation.   Eat a variety of high-fiber foods rather than one type of fiber.   Try to increase your intake of fiber through using high-fiber foods rather than fiber pills or supplements that contain small amounts of fiber.   The goal is to change the types of food eaten. Do not supplement your present diet with high-fiber foods, but replace foods in your present diet.   INCLUDE A VARIETY OF FIBER SOURCES  Replace refined and processed grains with whole grains, canned fruits with fresh fruits, and incorporate other fiber sources. White rice, white breads, and most bakery goods contain little or no fiber.   Brown whole-grain rice, buckwheat oats, and many fruits and vegetables are all good sources of fiber. These include: broccoli, Brussels sprouts, cabbage, cauliflower, beets, sweet potatoes, white potatoes (skin on), carrots, tomatoes, eggplant, squash, berries, fresh fruits, and dried fruits.   Cereals appear to be the richest source of fiber. Cereal fiber is found in whole grains and bran. Bran is the fiber-rich outer coat of cereal grain, which is largely removed in refining. In whole-grain cereals, the bran remains. In breakfast cereals, the largest amount of fiber is found in those with "bran" in their names. The fiber content is sometimes indicated on the label.   You may need to include additional fruits and vegetables each day.   In baking, for 1 cup white flour, you may use the following substitutions:   1 cup whole-wheat flour minus 2 tablespoons.   1/2 cup white flour plus 1/2 cup whole-wheat flour.   Diverticulosis Diverticulosis is a common condition that develops when small  pouches (diverticula) form in the wall of the colon. The risk of diverticulosis increases with age. It happens more often in people who eat a low-fiber diet. Most individuals with diverticulosis have no symptoms. Those individuals with symptoms usually experience belly (abdominal) pain, constipation, or loose stools (diarrhea).  HOME CARE INSTRUCTIONS  Increase the amount of fiber in your diet as directed by your caregiver or dietician. This may reduce symptoms of diverticulosis.   Drink at least 6 to 8 glasses of water each day to prevent constipation.   Try not to strain when you have a bowel movement.   Avoiding nuts and seeds to prevent complications is NOT NECESSARY.    FOODS HAVING HIGH FIBER CONTENT INCLUDE:  Fruits. Apple, peach, pear, tangerine, raisins, prunes.   Vegetables. Brussels sprouts, asparagus, broccoli, cabbage, carrot, cauliflower, romaine lettuce, spinach, summer squash, tomato, winter squash, zucchini.   Starchy Vegetables. Baked beans, kidney beans, lima beans, split peas, lentils, potatoes (with skin).   Grains. Whole wheat bread, brown rice, bran flake cereal, plain oatmeal, white rice, shredded wheat, bran muffins.  SEEK IMMEDIATE MEDICAL CARE IF:  You develop increasing pain or severe bloating.   You have an oral temperature above 101F.   You develop vomiting or bowel movements that are bloody or black.   Hemorrhoids Hemorrhoids are dilated (enlarged) veins around the rectum. Sometimes clots will form in the veins. This makes them swollen and painful. These are called thrombosed hemorrhoids. Causes of hemorrhoids include:  Constipation.   Straining to have a bowel movement.   HEAVY LIFTING  HOME CARE INSTRUCTIONS  Eat a well balanced diet and drink 6 to 8 glasses of water every day to avoid constipation. You may also use a bulk laxative.   Avoid straining to have bowel movements.   Keep anal area dry and clean.   Do not use a donut shaped  pillow or sit on the toilet for long periods. This increases blood pooling and pain.   Move your bowels when your body has the urge; this will require less straining and will decrease pain and pressure.

## 2015-04-19 NOTE — Op Note (Signed)
[facility] Patient Name: Dean [name] Procedure Date: 04/19/2015 1:59 PM MRN: [id number] Date of Birth: 14-May-1935 Attending MD: Barney Drain , MD CSN: [id number] Age: 80 Admit Type: Outpatient Procedure:                Colonoscopy Indications:              Abnormal PET scan of the GI tract Providers:                Barney Drain, MD, Lurline Del, RN, Zoila Shutter,                            Technologist Referring MD:             Ancil Linsey (Referring MD) Medicines:                Meperidine 100 mg IV, Midazolam 7 mg IV Complications:            No immediate complications. Estimated Blood Loss:     Estimated blood loss: none. Procedure:                Pre-Anesthesia Assessment:                           - Prior to the procedure, a History and Physical                            was performed, and patient medications and                            allergies were reviewed. The patient's tolerance of                            previous anesthesia was also reviewed. The risks                            and benefits of the procedure and the sedation                            options and risks were discussed with the patient.                            All questions were answered, and informed consent                            was obtained. Anticoagulants: The patient has taken                            aspirin. It was decided not to withhold this                            medication prior to the procedure. ASA Grade                            Assessment: II - A patient with mild systemic  disease. After reviewing the risks and benefits,                            the patient was deemed in satisfactory condition to                            undergo the procedure.                           After obtaining informed consent, the colonoscope                            was passed under direct vision. Throughout the                            procedure,  the patient's blood pressure, pulse, and                            oxygen saturations were monitored continuously. The                            [id number]) scope was introduced through                            the anus and advanced to the the cecum, identified                            by appendiceal orifice and ileocecal valve. The                            ileocecal valve, appendiceal orifice, and rectum                            were photographed. The colonoscopy was performed                            with moderate difficulty due to a tortuous colon.                            The quality of the bowel preparation was good. Scope In: 2:37:56 PM Scope Out: 2:51:34 PM Scope Withdrawal Time: 0 hours 10 minutes 48 seconds  Total Procedure Duration: 0 hours 13 minutes 38 seconds  Findings:      The digital rectal exam findings include non-thrombosed external       hemorrhoids.      Multiple large-mouthed diverticula were found in the sigmoid colon,       descending colon and ascending colon.      Non-bleeding external and internal hemorrhoids were found. Estimated       blood loss: none. Impression:               - Non-thrombosed external hemorrhoids found on                            digital rectal exam.                           -  Severe diverticulosis in the sigmoid colon, in                            the descending colon and in the ascending colon.                           - Non-bleeding external and internal hemorrhoids.                           - No specimens collected. Moderate Sedation:      Moderate (conscious) sedation was administered by the endoscopy nurse       and supervised by the endoscopist. The following parameters were       monitored: oxygen saturation, heart rate, blood pressure, and response       to care. Total physician intraservice time was 38 minutes. Recommendation:           - Patient has a contact number available for                             emergencies. The signs and symptoms of potential                            delayed complications were discussed with the                            patient. Return to normal activities tomorrow.                            Written discharge instructions were provided to the                            patient.                           - High fiber diet.                           - Medication reconciliation was performed, and a                            list of the patient's discharge medications was                            provided to the patient.                           - No repeat colonoscopy due to age.                           - Refer to a colo-rectal surgeon in 2 weeks TO                            [place]. Procedure Code(s):        --- Professional ---  [phone], Colonoscopy, flexible; diagnostic, including                            collection of specimen(s) by brushing or washing,                            when performed (separate procedure)                           99153, Moderate sedation services; each additional                            15 minutes intraservice time                           99153, Moderate sedation services; each additional                            15 minutes intraservice time                           G0500, Moderate sedation services provided by the                            same physician or other qualified health care                            professional performing a gastrointestinal                            endoscopic service that sedation supports,                            requiring the presence of an independent trained                            observer to assist in the monitoring of the                            patient's level of consciousness and physiological                            status; initial 15 minutes of intra-service time;                             patient age 35 years or older (additional time may                            be reported with [phone], as appropriate) Diagnosis Code(s):        --- Professional ---                           K64.4, Residual hemorrhoidal skin tags  K64.8, Other hemorrhoids                           K57.30, Diverticulosis of large intestine without                            perforation or abscess without bleeding                           R93.3, Abnormal findings on diagnostic imaging of                            other parts of digestive tract CPT copyright 2016 American Medical Association. All rights reserved. The codes documented in this report are preliminary and upon coder review may  be revised to meet current compliance requirements. Barney Drain, MD Barney Drain, MD 04/19/2015 4:42:17 PM This report has been signed electronically. Number of Addenda: 0

## 2015-04-19 NOTE — H&P (Signed)
Primary Care Physician:  [name], MD Primary Gastroenterologist:  Dr. Oneida Alar  Pre-Procedure History & Physical: HPI:  Dean [name] is a 80 y.o. male here for Abnormal CT scan/PET scan iN sigmoid colon. NO ALARM SYMPTOMS OR SIGNS OF PERFORATED DIVERTICULITIS OR ABSCESS.  Past Medical History  Diagnosis Date  . Neuropathy ([place])   . BPH (benign prostatic hyperplasia)   . GERD (gastroesophageal reflux disease)   . H/O ETOH abuse   . Skull lesion     Left occipital condyle  . History of radiation therapy 01/05/13-02/10/13    45 gray to left occipital condyle region  . Multiple myeloma ([place])   . Radiation 02/21/14-03/08/14    right posterior chest wall area 30 gray  . Radiation 04/19/14-05/02/14    lumbar spine 25 gray  . Diverticulitis 00/86/76    complicated by abscess and required percutaneous drainage    Past Surgical History  Procedure Laterality Date  . Cyst excision  1959    tail bone    Prior to Admission medications   Medication Sig Start Date End Date Taking? Authorizing Provider  acyclovir (ZOVIRAX) 400 MG tablet Take 1 tablet (400 mg total) by mouth 2 (two) times daily. 02/14/15  Yes Baird Cancer, PA-C  ALPRAZolam Duanne Moron) 1 MG tablet Take 1-2 tablets (1-2 mg total) by mouth at bedtime as needed for sleep. For anxiety and/or sleep 02/19/15  Yes [name], MD  aspirin EC 325 MG tablet Take 1 tablet (325 mg total) by mouth daily. 03/09/14  Yes [name], MD  Calcium Carb-Cholecalciferol (CALCIUM-VITAMIN D3) 600-500 MG-UNIT CAPS Take 1 tablet by mouth 2 (two) times daily. With food 03/07/14  Yes [name], MD  diphenhydrAMINE (BENADRYL) 25 MG tablet Take 25 mg by mouth at bedtime as needed for sleep.   Yes Historical Provider, MD  furosemide (LASIX) 20 MG tablet Take 1 tablet (20 mg total) by mouth daily. Patient taking differently: Take 20 mg by mouth daily as needed for fluid.  02/16/15  Yes [name], MD  gabapentin (NEURONTIN) 400 MG  capsule Take 1 capsule (400 mg total) by mouth 4 (four) times daily. 04/05/15  Yes Manon Hilding Kefalas, PA-C  omeprazole (PRILOSEC) 20 MG capsule Take 1 capsule (20 mg total) by mouth daily. Patient taking differently: Take 20 mg by mouth daily as needed (acid reflux). As needed 05/10/14  Yes Baird Cancer, PA-C  oxyCODONE-acetaminophen (PERCOCET) 10-325 MG tablet Take 1 tablet by mouth every 4 (four) hours as needed for pain. 12/25/14  Yes [name], MD  peg 3350 powder (MOVIPREP) 100 g SOLR Take 1 kit (200 g total) by mouth as directed. 04/02/15  Yes Danie Binder, MD  tamsulosin (FLOMAX) 0.4 MG CAPS capsule Take 1 capsule (0.4 mg total) by mouth at bedtime. 02/14/15  Yes Baird Cancer, PA-C  UNABLE TO FIND Normal Saline Flush - 10ML BID 01/17/15   [name], MD    Allergies as of 03/29/2015 - Review Complete 03/28/2015  Allergen Reaction Noted  . Codeine Other (See Comments) 11/02/2012  . Morphine and related Other (See Comments) 01/11/2013    Family History  Problem Relation Age of Onset  . Diabetes Mother   . Stroke Mother   . Hypertension Father     Social History   Social History  . Marital Status: Married    Spouse Name: N/A  . Number of Children: 3  . Years of Education: N/A  Occupational History  .     Social History Main Topics  . Smoking status: Never Smoker   . Smokeless tobacco: Never Used  . Alcohol Use: No     Comment: " Not much no more"  . Drug Use: No  . Sexual Activity: Not on file   Other Topics Concern  . Not on file   Social History Narrative    Review of Systems: See HPI, otherwise negative ROS   Physical Exam: BP 115/74 mmHg  Pulse 81  Temp(Src) 97.5 F (36.4 C) (Oral)  Resp 18  SpO2 97% General:   Alert,  pleasant and cooperative in NAD Head:  Normocephalic and atraumatic. Neck:  Supple; Lungs:  Clear throughout to auscultation.    Heart:  Regular rate and rhythm. Abdomen:  Soft, nontender and nondistended. Normal  bowel sounds, without guarding, and without rebound.   Neurologic:  Alert and  oriented x4;  grossly normal neurologically.  Impression/Plan:    Abnormal CT scan/PET scan iN sigmoid colon.

## 2015-04-20 ENCOUNTER — Other Ambulatory Visit: Payer: Self-pay

## 2015-04-20 DIAGNOSIS — R948 Abnormal results of function studies of other organs and systems: Secondary | ICD-10-CM

## 2015-04-20 NOTE — Telephone Encounter (Signed)
REFERRAL HAS BEEN MADE  

## 2015-04-22 ENCOUNTER — Emergency Department (HOSPITAL_COMMUNITY)
Admission: EM | Admit: 2015-04-22 | Discharge: 2015-04-22 | Disposition: A | Discharge status: Home / Self Care | Payer: Medicare Other | Attending: Emergency Medicine | Admitting: Emergency Medicine

## 2015-04-22 ENCOUNTER — Encounter (HOSPITAL_COMMUNITY): Payer: Self-pay | Admitting: *Deleted

## 2015-04-22 DIAGNOSIS — Z79899 Other long term (current) drug therapy: Secondary | ICD-10-CM | POA: Insufficient documentation

## 2015-04-22 DIAGNOSIS — L509 Urticaria, unspecified: Secondary | ICD-10-CM

## 2015-04-22 DIAGNOSIS — T7840XA Allergy, unspecified, initial encounter: Secondary | ICD-10-CM

## 2015-04-22 DIAGNOSIS — Z7982 Long term (current) use of aspirin: Secondary | ICD-10-CM | POA: Insufficient documentation

## 2015-04-22 DIAGNOSIS — L500 Allergic urticaria: Secondary | ICD-10-CM | POA: Diagnosis not present

## 2015-04-22 DIAGNOSIS — R21 Rash and other nonspecific skin eruption: Secondary | ICD-10-CM | POA: Diagnosis present

## 2015-04-22 LAB — COMPREHENSIVE METABOLIC PANEL
ALT: 12 U/L — ABNORMAL LOW (ref 17–63)
AST: 24 U/L (ref 15–41)
Albumin: 3.4 g/dL — ABNORMAL LOW (ref 3.5–5.0)
Alkaline Phosphatase: 27 U/L — ABNORMAL LOW (ref 38–126)
Anion gap: 9 (ref 5–15)
BUN: 9 mg/dL (ref 6–20)
CO2: 24 mmol/L (ref 22–32)
Calcium: 8.6 mg/dL — ABNORMAL LOW (ref 8.9–10.3)
Chloride: 106 mmol/L (ref 101–111)
Creatinine, Ser: 0.76 mg/dL (ref 0.61–1.24)
GFR calc Af Amer: >60 mL/min (ref >60)
GFR calc non Af Amer: >60 mL/min (ref >60)
Glucose, Bld: 121 mg/dL — ABNORMAL HIGH (ref 65–99)
Potassium: 3.8 mmol/L (ref 3.5–5.1)
Sodium: 139 mmol/L (ref 135–145)
Total Bilirubin: 0.6 mg/dL (ref 0.3–1.2)
Total Protein: 6 g/dL — ABNORMAL LOW (ref 6.5–8.1)

## 2015-04-22 LAB — CBC WITH DIFFERENTIAL/PLATELET
Basophils Absolute: 0 10*3/uL (ref 0.0–0.1)
Basophils Relative: 0 %
Eosinophils Absolute: 0 10*3/uL (ref 0.0–0.7)
Eosinophils Relative: 0 %
HCT: 43.6 % (ref 39.0–52.0)
Hemoglobin: 14.8 g/dL (ref 13.0–17.0)
Lymphocytes Relative: 19 %
Lymphs Abs: 1.3 10*3/uL (ref 0.7–4.0)
MCH: 29.5 pg (ref 26.0–34.0)
MCHC: 33.9 g/dL (ref 30.0–36.0)
MCV: 86.9 fL (ref 78.0–100.0)
Monocytes Absolute: 0.2 10*3/uL (ref 0.1–1.0)
Monocytes Relative: 4 %
Neutro Abs: 5.3 10*3/uL (ref 1.7–7.7)
Neutrophils Relative %: 77 %
Platelets: 153 10*3/uL (ref 150–400)
RBC: 5.02 MIL/uL (ref 4.22–5.81)
RDW: 12.7 % (ref 11.5–15.5)
WBC: 6.8 10*3/uL (ref 4.0–10.5)

## 2015-04-22 MED ORDER — FAMOTIDINE 20 MG PO TABS: 20.0000 mg | ORAL_TABLET | Freq: Two times a day (BID) | ORAL | Status: DC

## 2015-04-22 MED ORDER — EPINEPHRINE 0.3 MG/0.3ML IJ SOAJ: 0.3000 mg | Freq: Once | INTRAMUSCULAR | Status: DC

## 2015-04-22 MED ORDER — FAMOTIDINE IN NACL 20-0.9 MG/50ML-% IV SOLN
20.0000 mg | Freq: Once | INTRAVENOUS | Status: AC
  Administered 2015-04-22: 20 mg via INTRAVENOUS
  Filled 2015-04-22: qty 50

## 2015-04-22 MED ORDER — EPINEPHRINE 0.3 MG/0.3ML IJ SOAJ
0.3000 mg | Freq: Once | INTRAMUSCULAR | Status: AC
  Administered 2015-04-22: 0.3 mg via INTRAMUSCULAR
  Filled 2015-04-22: qty 0.3

## 2015-04-22 MED ORDER — METHYLPREDNISOLONE SODIUM SUCC 125 MG IJ SOLR
125.0000 mg | Freq: Once | INTRAMUSCULAR | Status: AC
  Administered 2015-04-22: 125 mg via INTRAVENOUS
  Filled 2015-04-22: qty 2

## 2015-04-22 MED ORDER — DIPHENHYDRAMINE HCL 50 MG/ML IJ SOLN
50.0000 mg | Freq: Once | INTRAMUSCULAR | Status: AC
  Administered 2015-04-22: 50 mg via INTRAVENOUS
  Filled 2015-04-22: qty 1

## 2015-04-22 MED ORDER — PREDNISONE 10 MG PO TABS: 20.0000 mg | ORAL_TABLET | Freq: Every day | ORAL | Status: DC

## 2015-04-22 MED ORDER — DIPHENHYDRAMINE HCL 25 MG PO TABS: 25.0000 mg | ORAL_TABLET | ORAL | Status: DC | PRN

## 2015-04-22 NOTE — ED Provider Notes (Signed)
CSN: [id number]     Arrival date & time 04/22/15  1043 History   First MD Initiated Contact with Dean [name] 04/22/15 1101     Chief Complaint  Dean [name] presents with  . Rash     (Consider location/radiation/quality/duration/timing/severity/associated sxs/prior Treatment) HPI Comments: Dean [name] is a 80yo male with a PMHx of multiple myeloma and recent colonic abscess who presents today with a diffuse rash. The rash began early this morning. It began at the Dean [name]'s ankles and has spread diffusely to the Dean [name]'s upper and lower extremities, flank, and upper back. The rash is itchy. The Dean [name] tried taking Benadryl and cortisone cream at home with relief of itching but not rash. Dean [name] cannot think of any new food or contact exposures, except a scented soap he washed only his hands with once in the past couple days. Dean [name] had a colonoscopy Thursday for follow up of a diverticular abscess that was surgically drained in December. Dean [name] reported he pulled a tick off of him 1 week ago. Tick was no attached or engorged. Dean [name] has had normal appetite. Denies chest pain, shortness of breath, enlargement of tongue or airway, abdominal pain, N/V/D, headaches, dysuria.  Dean [name] is a 80 y.o. male presenting with rash. The history is provided by the Dean [name], the spouse and a relative. No language interpreter was used.  Rash Associated symptoms: no abdominal pain, no fever, no headaches, no nausea, no shortness of breath, no sore throat and not vomiting     Past Medical History  Diagnosis Date  . Neuropathy ([place])   . BPH (benign prostatic hyperplasia)   . GERD (gastroesophageal reflux disease)   . H/O ETOH abuse   . Skull lesion     Left occipital condyle  . History of radiation therapy 01/05/13-02/10/13    45 gray to left occipital condyle region  . Multiple myeloma ([place])   . Radiation 02/21/14-03/08/14    right posterior chest wall area 30 gray  . Radiation 04/19/14-05/02/14    lumbar spine 25 gray    . Diverticulitis 57/26/20    complicated by abscess and required percutaneous drainage   Past Surgical History  Procedure Laterality Date  . Cyst excision  1959    tail bone   Family History  Problem Relation Age of Onset  . Diabetes Mother   . Stroke Mother   . Hypertension Father    Social History  Substance Use Topics  . Smoking status: Never Smoker   . Smokeless tobacco: Never Used  . Alcohol Use: No     Comment: " Not much no more"    Review of Systems  Constitutional: Negative for fever and chills.  HENT: Negative for facial swelling and sore throat.   Respiratory: Negative for shortness of breath.   Cardiovascular: Negative for chest pain.  Gastrointestinal: Negative for nausea, vomiting and abdominal pain.  Genitourinary: Negative for dysuria.  Musculoskeletal: Negative for back pain.  Skin: Positive for rash. Negative for wound.  Neurological: Negative for headaches.  Psychiatric/Behavioral: The Dean [name] is not nervous/anxious.       Allergies  Codeine and Morphine and related  Home Medications   Prior to Admission medications   Medication Sig Start Date End Date Taking? Authorizing Provider  ALPRAZolam Duanne Moron) 1 MG tablet Take 1-2 tablets (1-2 mg total) by mouth at bedtime as needed for sleep. For anxiety and/or sleep 02/19/15  Yes [name], MD  aspirin EC 325 MG tablet Take 1 tablet (325 mg total) by mouth daily. 03/09/14  Yes  [name], MD  Calcium Carb-Cholecalciferol (CALCIUM-VITAMIN D3) 600-500 MG-UNIT CAPS Take 1 tablet by mouth 2 (two) times daily. With food Dean [name] taking differently: Take 1 tablet by mouth 3 (three) times daily. With food 03/07/14  Yes [name], MD  furosemide (LASIX) 20 MG tablet Take 1 tablet (20 mg total) by mouth daily. Dean [name] taking differently: Take 20 mg by mouth daily as needed for fluid.  02/16/15  Yes [name], MD  gabapentin (NEURONTIN) 400 MG capsule Take 1 capsule (400 mg total) by mouth 4  (four) times daily. Dean [name] taking differently: Take 400 mg by mouth 3 (three) times daily.  04/05/15  Yes Manon Hilding Kefalas, PA-C  omeprazole (PRILOSEC) 20 MG capsule Take 1 capsule (20 mg total) by mouth daily. Dean [name] taking differently: Take 20 mg by mouth daily as needed (acid reflux). As needed 05/10/14  Yes Baird Cancer, PA-C  oxyCODONE-acetaminophen (PERCOCET) 10-325 MG tablet Take 1 tablet by mouth every 4 (four) hours as needed for pain. 12/25/14  Yes [name], MD  tamsulosin (FLOMAX) 0.4 MG CAPS capsule Take 1 capsule (0.4 mg total) by mouth at bedtime. 02/14/15  Yes Baird Cancer, PA-C  zolendronic acid (ZOMETA) 4 MG/5ML injection Inject 4 mg into the vein once.   Yes Historical Provider, MD  diphenhydrAMINE (BENADRYL) 25 MG tablet Take 1 tablet (25 mg total) by mouth every 4 (four) hours as needed for itching or sleep. 04/22/15   Frederica Kuster, PA-C  EPINEPHrine 0.3 mg/0.3 mL IJ SOAJ injection Inject 0.3 mLs (0.3 mg total) into the muscle once. 04/22/15   Frederica Kuster, PA-C  famotidine (PEPCID) 20 MG tablet Take 1 tablet (20 mg total) by mouth 2 (two) times daily. 04/22/15 05/06/15  Berlyn Malina M Francesco Provencal, PA-C  predniSONE (DELTASONE) 10 MG tablet Take 2 tablets (20 mg total) by mouth daily. 04/22/15   Jeston Junkins M Samadhi Mahurin, PA-C  UNABLE TO FIND Normal Saline Flush - 10ML BID 01/17/15   [name], MD   BP 115/62 mmHg  Pulse 76  Temp(Src) 97.9 F (36.6 C) (Oral)  Resp 16  Ht 5' 8"  (1.727 m)  Wt 104.781 kg  BMI 35.13 kg/m2  SpO2 97% Physical Exam  Constitutional: He appears well-developed and well-nourished. No distress.  HENT:  Head: Normocephalic and atraumatic.  Mouth/Throat: No oropharyngeal exudate.  No obstruction of airway, tongue nonobstructive  Eyes: Conjunctivae are normal. Pupils are equal, round, and reactive to light. Right eye exhibits no discharge. Left eye exhibits no discharge. No scleral icterus.  Neck: Normal range of motion. Neck supple. No thyromegaly  present.  Cardiovascular: Normal rate, regular rhythm, normal heart sounds and intact distal pulses.  Exam reveals no gallop and no friction rub.   No murmur heard. Pulmonary/Chest: Effort normal and breath sounds normal. No stridor. No respiratory distress. He has no wheezes. He has no rales.  Abdominal: Soft. Bowel sounds are normal. He exhibits no distension. There is no tenderness. There is no rebound and no guarding.  Musculoskeletal: He exhibits no edema.  Lymphadenopathy:    He has no cervical adenopathy.  Neurological: He is alert. Coordination normal.  Skin: Skin is warm and dry. Rash noted. No purpura noted. Rash is urticarial. Rash is not nodular, not pustular and not vesicular. He is not diaphoretic. No pallor.     Diffuse urticarial rash on upper and lower extremities, flank, and back. Spares palms and soles.  Psychiatric: He has a normal mood and affect.  Nursing note and vitals reviewed.   ED Course  Procedures (including critical care time) Labs Review Labs Reviewed  COMPREHENSIVE METABOLIC PANEL - Abnormal; Notable for the following:    Glucose, Bld 121 (*)    Calcium 8.6 (*)    Total Protein 6.0 (*)    Albumin 3.4 (*)    ALT 12 (*)    Alkaline Phosphatase 27 (*)    All other components within normal limits  CBC WITH DIFFERENTIAL/PLATELET  ROCKY MTN SPOTTED FVR ABS PNL(IGG+IGM)    Imaging Review No results found. I have personally reviewed and evaluated these images and lab results as part of my medical decision-making.   EKG Interpretation None      MDM   Dean [name] is a 80yo male with a PMHx of multiple myeloma and recent colonic abscess who presents today with a diffuse rash. Per history of tick, RMSF considered: normal Na, PLTs, and LFTs. No bull's eye rash noted. Dean [name] re-evaluated prior to dc, is hemodynamically stable and afebrile, in no respiratory distress, and denies the feeling of throat closing. Itching improved after benadryl, solu-medrol,  Pepcid, and Epi Pen. Rash did not improve drastically with above treatments. Dean [name] stable however, and watched for 5 hours with no development of angioedema or anaphylaxis.  Pt has been advised to take OTC benadryl, Prednisone, Pepcid & return to the ED if they have a mod-severe allergic rxn (s/s including throat closing, difficulty breathing, swelling of lips face or tongue). Pt is to follow up with their PCP tomorrow for recheck and schedule allergy testing. Dean [name] evaluated by both Dr. Sabra Heck and Dr. Marin Comment with Internal Medicine who agree with the plan. Pt is agreeable with plan & verbalizes understanding.  Final diagnoses:  Allergic reaction, initial encounter  Urticaria       Frederica Kuster, PA-C 04/22/15 1805  Noemi Chapel, MD 04/23/15 [phone]

## 2015-04-22 NOTE — ED Provider Notes (Signed)
The patient is a 80 year old male, presents with a rash that started overnight, initially had itching on his ankles, then developed more on his arms and his sides. He denies any shortness of breath or swelling in the mouth. On exam he has urticarial lesions across his upper extremities, flanks, upper back, lower extremities. This is blanching, there is no petechiae or purpura except for a small part of the R upper inner arm rash which has some scattered petechia., this is not a palpable rash. His oropharynx is clear and moist, conjunctivae are clear, no wheezing on exam. This appears to be consistent with an urticarial rash consistent with an allergic reaction. Medications as indicated, repeat exam within 2 hours, a broad list of possible exposures was reviewed with the patient with no obvious source.  After meds including epinepherine, solumderol and benadryl, the pt is better - has no oral airway involvement - there is a diffuse migratory blanching and pruritic rash.  He has no airway involvement - I have asked Dr. Orvan Falconer to evaluate the pt and he is in agreement that the pt has likely allergic reaction - pt cautioned to follow up closely with PCP - 24 hours f/u - return precautions given as well as indication for need of use of the Epi Pen.  He and family have expressed their understanding.  Meds given in ED:  Medications  methylPREDNISolone sodium succinate (SOLU-MEDROL) 125 mg/2 mL injection 125 mg (125 mg Intravenous Given 04/22/15 1202)  famotidine (PEPCID) IVPB 20 mg premix (0 mg Intravenous Stopped 04/22/15 1249)  diphenhydrAMINE (BENADRYL) injection 50 mg (50 mg Intravenous Given 04/22/15 1202)  EPINEPHrine (EPI-PEN) injection 0.3 mg (0.3 mg Intramuscular Given 04/22/15 1341)    Discharge Medication List as of 04/22/2015  3:52 PM    START taking these medications   Details  EPINEPHrine 0.3 mg/0.3 mL IJ SOAJ injection Inject 0.3 mLs (0.3 mg total) into the muscle once., Starting 04/22/2015,  Print    famotidine (PEPCID) 20 MG tablet Take 1 tablet (20 mg total) by mouth 2 (two) times daily., Starting 04/22/2015, Until Sun 05/06/15, Print    predniSONE (DELTASONE) 10 MG tablet Take 2 tablets (20 mg total) by mouth daily., Starting 04/22/2015, Until Discontinued, Print          Medical screening examination/treatment/procedure(s) were conducted as a shared visit with non-physician practitioner(s) and myself.  I personally evaluated the patient during the encounter.  Clinical Impression:   Final diagnoses:  Allergic reaction, initial encounter  Urticaria         Noemi Chapel, MD 04/23/15 [phone]

## 2015-04-22 NOTE — Discharge Instructions (Signed)
Medications: Benadryl, Pepcid, Prednisone, and EpiPen  Treatment: Take Benadryl every 4 hours as needed for itching. Take Pepcid twice daily for 2 weeks. Take Prednisone (2 tablets/dose) once daily. Use Epi Pen only as needed for tongue swelling, narrowed airway, or respiratory distress. You may use topical Benadryl cream in the worst areas of itching.  Follow-up: Please call Dr. Dennard Schaumann tomorrow to make an appointment to reevaluate your rash and for allergy testing. You make take a picture today and show it to him tomorrow for reference. Please return to the emergency department if you are unable to breathe, you feel your tongue swelling, or develop any other new or concerning symptom or pain.   Hives Hives are itchy, red, swollen areas of the skin. They can vary in size and location on your body. Hives can come and go for hours or several days (acute hives) or for several weeks (chronic hives). Hives do not spread from person to person (noncontagious). They may get worse with scratching, exercise, and emotional stress. CAUSES   Allergic reaction to food, additives, or drugs.  Infections, including the common cold.  Illness, such as vasculitis, lupus, or thyroid disease.  Exposure to sunlight, heat, or cold.  Exercise.  Stress.  Contact with chemicals. SYMPTOMS   Red or white swollen patches on the skin. The patches may change size, shape, and location quickly and repeatedly.  Itching.  Swelling of the hands, feet, and face. This may occur if hives develop deeper in the skin. DIAGNOSIS  Your caregiver can usually tell what is wrong by performing a physical exam. Skin or blood tests may also be done to determine the cause of your hives. In some cases, the cause cannot be determined. TREATMENT  Mild cases usually get better with medicines such as antihistamines. Severe cases may require an emergency epinephrine injection. If the cause of your hives is known, treatment includes  avoiding that trigger.  HOME CARE INSTRUCTIONS   Avoid causes that trigger your hives.  Take antihistamines as directed by your caregiver to reduce the severity of your hives. Non-sedating or low-sedating antihistamines are usually recommended. Do not drive while taking an antihistamine.  Take any other medicines prescribed for itching as directed by your caregiver.  Wear loose-fitting clothing.  Keep all follow-up appointments as directed by your caregiver. SEEK MEDICAL CARE IF:   You have persistent or severe itching that is not relieved with medicine.  You have painful or swollen joints. SEEK IMMEDIATE MEDICAL CARE IF:   You have a fever.  Your tongue or lips are swollen.  You have trouble breathing or swallowing.  You feel tightness in the throat or chest.  You have abdominal pain. These problems may be the first sign of a life-threatening allergic reaction. Call your local emergency services (911 in [place] MAKE SURE YOU:   Understand these instructions.  Will watch your condition.  Will get help right away if you are not doing well or get worse.   This information is not intended to replace advice given to you by your health care provider. Make sure you discuss any questions you have with your health care provider.   Document Released: 01/20/2005 Document Revised: 01/25/2013 Document Reviewed: 04/15/2011 Elsevier Interactive Patient Education [employer].

## 2015-04-22 NOTE — ED Notes (Signed)
Pt comes in for rash over both arms and legs. Pt states this started when he woke up the morning. Pt has no breathing difficulties. Took benadryl before coming. Pt was in remission for multiple myeloma; hasn't had chemo in over 2 month.

## 2015-04-23 ENCOUNTER — Telehealth: Payer: Self-pay | Admitting: Family Medicine

## 2015-04-23 ENCOUNTER — Telehealth: Payer: Self-pay

## 2015-04-23 ENCOUNTER — Ambulatory Visit (INDEPENDENT_AMBULATORY_CARE_PROVIDER_SITE_OTHER): Payer: Medicare Other | Admitting: Family Medicine

## 2015-04-23 ENCOUNTER — Encounter: Payer: Self-pay | Admitting: Family Medicine

## 2015-04-23 VITALS — BP 100/66 | HR 76 | Temp 97.5°F | Resp 16 | Ht 69.0 in | Wt 230.0 lb

## 2015-04-23 DIAGNOSIS — T7840XD Allergy, unspecified, subsequent encounter: Secondary | ICD-10-CM | POA: Diagnosis not present

## 2015-04-23 DIAGNOSIS — L219 Seborrheic dermatitis, unspecified: Secondary | ICD-10-CM | POA: Diagnosis not present

## 2015-04-23 MED ORDER — DESONIDE 0.05 % EX CREA: TOPICAL_CREAM | Freq: Two times a day (BID) | CUTANEOUS | Status: DC

## 2015-04-23 NOTE — Telephone Encounter (Signed)
REVIEWED-NO ADDITIONAL RECOMMENDATIONS. 

## 2015-04-23 NOTE — Telephone Encounter (Signed)
Submitted PA through CoverMyMeds.com   R21 - rash - Medication is the only one safe for use on the face

## 2015-04-23 NOTE — Progress Notes (Signed)
Subjective:    Patient ID: Dean [name], male    DOB: 1935/03/10, 80 y.o.   MRN: [id number]  HPI Sunday morning, the patient broke out in a very itchy rash on both ankles and both shins that were characterized as erythematous papules and urticaria. He also developed urticaria on his lower back, on the medial side of his biceps, and on the dorsal aspects of both forearms. He went to emergency room where he was given Benadryl, Pepcid, prednisone, and ultimately an EpiPen. Shortly after all this treatment, the rash began to subside. Today he has an erythematous macular rash on the medial sides of both biceps and on the dorsums of both forearms which is much better inflated compared to the picture she had yesterday. Today there are no erythematous papules or urticaria. This is much better compared to yesterday. The itching has also subsided. However he is on prednisone and Benadryl and Pepcid. He is unsure of what he could've been exposed to. Past Medical History  Diagnosis Date  . Neuropathy ([place])   . BPH (benign prostatic hyperplasia)   . GERD (gastroesophageal reflux disease)   . H/O ETOH abuse   . Skull lesion     Left occipital condyle  . History of radiation therapy 01/05/13-02/10/13    45 gray to left occipital condyle region  . Multiple myeloma ([place])   . Radiation 02/21/14-03/08/14    right posterior chest wall area 30 gray  . Radiation 04/19/14-05/02/14    lumbar spine 25 gray  . Diverticulitis 50/93/26    complicated by abscess and required percutaneous drainage   Past Surgical History  Procedure Laterality Date  . Cyst excision  1959    tail bone   Current Outpatient Prescriptions on File Prior to Visit  Medication Sig Dispense Refill  . ALPRAZolam (XANAX) 1 MG tablet Take 1-2 tablets (1-2 mg total) by mouth at bedtime as needed for sleep. For anxiety and/or sleep 60 tablet 1  . aspirin EC 325 MG tablet Take 1 tablet (325 mg total) by mouth daily. 30 tablet 11  . Calcium  Carb-Cholecalciferol (CALCIUM-VITAMIN D3) 600-500 MG-UNIT CAPS Take 1 tablet by mouth 2 (two) times daily. With food (Patient taking differently: Take 1 tablet by mouth 3 (three) times daily. With food) 60 capsule 6  . diphenhydrAMINE (BENADRYL) 25 MG tablet Take 1 tablet (25 mg total) by mouth every 4 (four) hours as needed for itching or sleep. 30 tablet 0  . EPINEPHrine 0.3 mg/0.3 mL IJ SOAJ injection Inject 0.3 mLs (0.3 mg total) into the muscle once. 1 Device 0  . famotidine (PEPCID) 20 MG tablet Take 1 tablet (20 mg total) by mouth 2 (two) times daily. 30 tablet 0  . gabapentin (NEURONTIN) 400 MG capsule Take 1 capsule (400 mg total) by mouth 4 (four) times daily. (Patient taking differently: Take 400 mg by mouth 3 (three) times daily. ) 120 capsule 2  . omeprazole (PRILOSEC) 20 MG capsule Take 1 capsule (20 mg total) by mouth daily. (Patient taking differently: Take 20 mg by mouth daily. PRN) 30 capsule 5  . oxyCODONE-acetaminophen (PERCOCET) 10-325 MG tablet Take 1 tablet by mouth every 4 (four) hours as needed for pain. 60 tablet 0  . predniSONE (DELTASONE) 10 MG tablet Take 2 tablets (20 mg total) by mouth daily. 10 tablet 0  . tamsulosin (FLOMAX) 0.4 MG CAPS capsule Take 1 capsule (0.4 mg total) by mouth at bedtime. 30 capsule 3  . UNABLE TO FIND Normal  Saline Flush - 10ML BID 60 Syringe 1  . zolendronic acid (ZOMETA) 4 MG/5ML injection Inject 4 mg into the vein once.    . furosemide (LASIX) 20 MG tablet Take 1 tablet (20 mg total) by mouth daily. (Patient not taking: Reported on 04/23/2015) 30 tablet 3   Current Facility-Administered Medications on File Prior to Visit  Medication Dose Route Frequency Provider Last Rate Last Dose  . sodium chloride 0.9 % injection 10 mL  10 mL Intracatheter PRN Thomas S Kefalas, PA-C   10 mL at 08/04/14 1103   Allergies  Allergen Reactions  . Codeine Other (See Comments)    Severe headache  . Morphine And Related Other (See Comments)    Makes him feel  weird   Social History   Social History  . Marital Status: Married    Spouse Name: N/A  . Number of Children: 3  . Years of Education: N/A   Occupational History  .     Social History Main Topics  . Smoking status: Never Smoker   . Smokeless tobacco: Never Used  . Alcohol Use: No     Comment: " Not much no more"  . Drug Use: No  . Sexual Activity: Not on file   Other Topics Concern  . Not on file   Social History Narrative      Review of Systems  All other systems reviewed and are negative.      Objective:   Physical Exam  Cardiovascular: Normal rate, regular rhythm and normal heart sounds.   Pulmonary/Chest: Effort normal and breath sounds normal.  Skin: Rash noted. There is erythema.  Vitals reviewed.         Assessment & Plan:  Seborrheic dermatitis - Plan: desonide (DESOWEN) 0.05 % cream  Allergic reaction, subsequent encounter  Rash seems to be subsiding with treatment for an allergic reaction. He does have some mild [place] dermatitis over his eyelids and behind his ear which I will treat with DesOwen and Nizoral shampoo. As to his urticaria I am unsure of the cause. Offered the patient a referral to an allergist for allergy testing. The present time he would like to complete the treatment and see if the rash returned since it is so much better today. I believe this is reasonable. We will also need to keep in the differential paraneoplastic dermatitis such as vasculitis. 

## 2015-04-23 NOTE — Telephone Encounter (Signed)
Dean [name] from Dr. Arnoldo Morale office called to inform us that pt cancelled his appointment with Dr. Arnoldo Morale. States the pt told her he just wasn't going to come.   Routing to [facility] to make her aware

## 2015-04-24 ENCOUNTER — Encounter (HOSPITAL_COMMUNITY): Payer: Self-pay | Admitting: Gastroenterology

## 2015-04-24 LAB — ROCKY MTN SPOTTED FVR ABS PNL(IGG+IGM)
RMSF IgG: POSITIVE — AB
RMSF IgM: 0.16 index (ref 0.00–0.89)

## 2015-04-24 LAB — RMSF, IGG, IFA: RMSF, IGG, IFA: <1:64 {titer}

## 2015-04-24 MED ORDER — DESONIDE 0.05 % EX CREA: TOPICAL_CREAM | Freq: Two times a day (BID) | CUTANEOUS | Status: DC

## 2015-04-24 NOTE — Telephone Encounter (Signed)
Has been approved through insurance. Pharmacy aware.

## 2015-05-01 ENCOUNTER — Encounter (HOSPITAL_COMMUNITY): Payer: Self-pay | Admitting: Lab

## 2015-05-01 NOTE — Progress Notes (Signed)
Referral sent [facility] Dr Zella Richer.  Records faxed on 3/28.  They will contact patient

## 2015-05-16 NOTE — Assessment & Plan Note (Addendum)
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  PET imaging on 08/11/2014 demonstrates significant response to therapy with repeat imaging on 03/02/2015 confirming stability of disease. Treatment now on hold due to potential diverticular abscess requiring surgical evaluation. Colonoscopy on 04/19/2015 demonstrated severe diverticulosis in the sigmoid colon, descending colon, and ascending colon by Dr. Oneida Alar.  Oncology history is updated.  We will continue with Zometa every 4 weeks; labs permitting.  He has an appointment with Dr. Johney Maine upcoming on 05/28/2015 for surgical consultation.  We'll continue to hold therapy until recommendation from general surgery is provided.   Multiple myeloma disease is very stable based upon peripheral laboratory work and imaging studies.  He requests a refill on Xanax and Flomax. Both prescriptions are provided today. He requests a refill on a "purple pill." I assumed it was Nexium but after the patient left and I was working on current dictation, I realize that Nexium is now his medication list nor has ever been prescribed within the [facility] system. I've asked nursing to call the patient and confirm the medication he needs a refill on in order to prevent ordering of the incorrect medication and providing him necessary medications.  He'll return in 4 weeks' time for follow-up with Zometa (labs permitting).

## 2015-05-16 NOTE — Progress Notes (Signed)
Dean Fraction, MD [address] [place] [address]  Multiple myeloma without remission ([place])  Insomnia - Plan: ALPRAZolam (XANAX) 1 MG tablet  BPH (benign prostatic hyperplasia) - Plan: tamsulosin (FLOMAX) 0.4 MG CAPS capsule  CURRENT THERAPY: on HOLD, except for Zometa every 4 weeks.  INTERVAL HISTORY: Dean [name] 80 y.o. male returns for followup of IgG kappa multiple myeloma.    Multiple myeloma without remission ([place])   11/16/2012 Initial Diagnosis L occipital condyle destructive lesion, not biopsied secondary to location. XRT   12/01/2012 Imaging L3 superior endplate compression FX, no lytic lesions to suggest myeloma   02/06/2014 Imaging soft tissue mass along the right posterior fifth rib with some rib destruction   02/16/2014 Miscellaneous Normal CBC, Normal CMP, kappa,lamda ratio of 2.62 (abnormal), UIEP with slightly restricted band, IgG kappa, SPEP/IEP with monoclonal protein at 0.79 g/dl, normal igG, suppressed IGM   02/20/2014 Bone Marrow Biopsy 33% kappa restricted plasma cells Normal FISH, normal cytogenetics   02/21/2014 - 03/08/2014 Radiation Therapy 30Gy to R rib lesion, lesion not biopsied   03/16/2014 PET scan Scattered hypermetabolic osseous lesions, including the calvarium, ribs, left scapula, sacrum, and left femoral shaft. An expansile left vertebral body lesion at L3 extends into the epidural space of the left lateral recess,    03/17/2014 - 04/07/2014 Chemotherapy Velcade and Dexamethasone due to initial denial of Revlimid by insurance company.  Zometa monthly.   03/22/2014 Imaging MR_ L-Spine- Enhancing lesions compatible with multiple myeloma at L2, L3, and S1-2.   04/07/2014 - 07/28/2014 Chemotherapy RVD with Revlimid at 25 mg days 1-14.  Revlimid dose reduced to 25 mg every other day x 14 days with 7 day respite beginning on day 1 of cycle 2.   04/17/2014 Adverse Reaction Revlimid-induced rash.  Treated with steroids.   07/28/2014 -  08/04/2014 Chemotherapy Revlimid daily   08/04/2014 Adverse Reaction Velcade-induced peripheral neuropathy   08/04/2014 Treatment Plan Change D/C Velcade   08/11/2014 PET scan Response to therapy. Improvement and resolution of foci of osseous hypermetabolism.   08/22/2014 - 08/28/2014 Chemotherapy Revlimid 25 mg days 1-21 every 28 days   08/28/2014 Adverse Reaction Revlimid-induced rash. Medication held.  Medrol dose Pak prescribed.   09/04/2014 - 01/08/2015 Chemotherapy Revlimid 10 mg every other day, without dexamethasone   01/08/2015 - 01/15/2015 Hospital Admission Colonic diverticular abscess with IR drain placement   03/02/2015 PET scan Only bony uptake is low activity at site of deformity/callus at R second rib FX, high activity in sigmoid colon, advanced sigmoid divertidulosis. Abscess not resolved?    I personally reviewed and went over laboratory results with the patient.  The results are noted within this dictation.  Labs are very stable with a normal CBC, stable renal function, stable calcium level, undetectable M spike, normal kappa free light chains and normal kappa:lambda light chain ratio. We will update laboratory work today.  Chart reviewed. He has not upcoming appointment with Dr. Michael Boston at [facility] due to a suspected diverticular abscess after PET scan imaging demonstrated high activity in the sigmoid colon along with fairly advanced sigmoid colon diverticulosis. He underwent colonoscopy by Dr. Oneida Alar on 04/19/2015 revealing nonthrombosed external hemorrhoids, severe diverticulosis in the sigmoid colon, in the descending colon, and in the ascending colon, and nonbleeding external and internal hemorrhoids. As a result of this, he requires surgical evaluation for possible diverticular abscess. Therapy is on hold as a result with stability of multiple  myeloma laboratory work and evidence of stability on PET scan on 03/02/2015.  He notes persistent fatigue that on in-depth  questioning he reports may be a little bit better now compared to when he was on therapy. This is good information and know in the event that we have to reinitiate therapy in the future. I've asked him to remember this point in time and we will use this as his new baseline with regards to fatigue in the future. He is agreeable to this.  He's doing well. He denies any complaints. He denies any fevers, chills, night sweats, new pains, and abdominal pain. He denies any urinary complaints or bowel complaints. He notes his appetite is strong. His weight is up 7 pounds compared to last month.   Past Medical History  Diagnosis Date  . Neuropathy ([place])   . BPH (benign prostatic hyperplasia)   . GERD (gastroesophageal reflux disease)   . H/O ETOH abuse   . Skull lesion     Left occipital condyle  . History of radiation therapy 01/05/13-02/10/13    45 gray to left occipital condyle region  . Multiple myeloma ([place])   . Radiation 02/21/14-03/08/14    right posterior chest wall area 30 gray  . Radiation 04/19/14-05/02/14    lumbar spine 25 gray  . Diverticulitis 16/10/96    complicated by abscess and required percutaneous drainage    has Neuropathy ([place]); BPH (benign prostatic hyperplasia); GERD (gastroesophageal reflux disease); H/O ETOH abuse; Skull lesion; Neck pain; Fatigue; Loss of weight; Insomnia; Allergic rhinitis; Lumbar compression fracture ([place]); Dermatitis; Multiple myeloma without remission ([place]); Colonic diverticular abscess; Thrombocytopenia ([place]); Intra-abdominal abscess ([place]); Abnormal CT scan, sigmoid colon; Abnormal PET scan of colon; and Special screening for malignant neoplasms, colon on his problem list.     is allergic to codeine and morphine and related.  Current Outpatient Prescriptions on File Prior to Visit  Medication Sig Dispense Refill  . aspirin EC 325 MG tablet Take 1 tablet (325 mg total) by mouth daily. 30 tablet 11  . Calcium Carb-Cholecalciferol (CALCIUM-VITAMIN D3)  600-500 MG-UNIT CAPS Take 1 tablet by mouth 2 (two) times daily. With food (Patient taking differently: Take 1 tablet by mouth 3 (three) times daily. With food) 60 capsule 6  . desonide (DESOWEN) 0.05 % cream Apply topically 2 (two) times daily. Approved through insurance - please resubmit 30 g 0  . diphenhydrAMINE (BENADRYL) 25 MG tablet Take 1 tablet (25 mg total) by mouth every 4 (four) hours as needed for itching or sleep. 30 tablet 0  . EPINEPHrine 0.3 mg/0.3 mL IJ SOAJ injection Inject 0.3 mLs (0.3 mg total) into the muscle once. 1 Device 0  . famotidine (PEPCID) 20 MG tablet Take 1 tablet (20 mg total) by mouth 2 (two) times daily. 30 tablet 0  . furosemide (LASIX) 20 MG tablet Take 1 tablet (20 mg total) by mouth daily. (Patient not taking: Reported on 04/23/2015) 30 tablet 3  . gabapentin (NEURONTIN) 400 MG capsule Take 1 capsule (400 mg total) by mouth 4 (four) times daily. (Patient taking differently: Take 400 mg by mouth 3 (three) times daily. ) 120 capsule 2  . omeprazole (PRILOSEC) 20 MG capsule Take 1 capsule (20 mg total) by mouth daily. (Patient taking differently: Take 20 mg by mouth daily. PRN) 30 capsule 5  . oxyCODONE-acetaminophen (PERCOCET) 10-325 MG tablet Take 1 tablet by mouth every 4 (four) hours as needed for pain. 60 tablet 0  . predniSONE (DELTASONE) 10 MG tablet Take  2 tablets (20 mg total) by mouth daily. 10 tablet 0  . UNABLE TO FIND Normal Saline Flush - 10ML BID 60 Syringe 1  . zolendronic acid (ZOMETA) 4 MG/5ML injection Inject 4 mg into the vein once.     Current Facility-Administered Medications on File Prior to Visit  Medication Dose Route Frequency Provider Last Rate Last Dose  . sodium chloride 0.9 % injection 10 mL  10 mL Intracatheter PRN [facility], PA-C   10 mL at 08/04/14 1103    Past Surgical History  Procedure Laterality Date  . Cyst excision  1959    tail bone  . Colonoscopy N/A 04/19/2015    Procedure: COLONOSCOPY;  Surgeon: Danie Binder,  MD;  Location: [facility];  Service: Endoscopy;  Laterality: N/A;  1:30 PM    Denies any headaches, dizziness, double vision, fevers, chills, night sweats, nausea, vomiting, diarrhea, constipation, chest pain, heart palpitations, shortness of breath, blood in stool, black tarry stool, urinary pain, urinary burning, urinary frequency, hematuria.   PHYSICAL EXAMINATION  ECOG PERFORMANCE STATUS: 1 - Symptomatic but completely ambulatory  Filed Vitals:   05/17/15 1109  BP: 128/74  Pulse: 73  Temp: 97.7 F (36.5 C)  Resp: 18    GENERAL:alert, no distress, well nourished, well developed, comfortable, cooperative, obese, smiling and accompanied by a male SKIN: skin color, texture, turgor are normal, no rashes or significant lesions HEAD: Normocephalic, No masses, lesions, tenderness or abnormalities EYES: normal, EOMI, Conjunctiva are pink and non-injected EARS: External ears normal OROPHARYNX:lips, buccal mucosa, and tongue normal and mucous membranes are moist  NECK: supple, no adenopathy, trachea midline LYMPH:  no palpable lymphadenopathy BREAST:not examined LUNGS: clear to auscultation and percussion HEART: regular rate & rhythm, no murmurs, no gallops, S1 normal and S2 normal ABDOMEN:abdomen soft, non-tender and normal bowel sounds BACK: Back symmetric, no curvature., No CVA tenderness EXTREMITIES:less then 2 second capillary refill, no joint deformities, effusion, or inflammation, no edema, no skin discoloration, no clubbing, no cyanosis  NEURO: alert & oriented x 3 with fluent speech, no focal motor/sensory deficits, gait normal   LABORATORY DATA: CBC    Component Value Date/Time   WBC 5.2 05/17/2015 0949   RBC 4.52 05/17/2015 0949   HGB 13.2 05/17/2015 0949   HCT 39.9 05/17/2015 0949   PLT 157 05/17/2015 0949   MCV 88.3 05/17/2015 0949   MCH 29.2 05/17/2015 0949   MCHC 33.1 05/17/2015 0949   RDW 13.6 05/17/2015 0949   LYMPHSABS 1.0 05/17/2015 0949   MONOABS 0.7  05/17/2015 0949   EOSABS 0.0 05/17/2015 0949   BASOSABS 0.0 05/17/2015 0949      Chemistry      Component Value Date/Time   NA 142 05/17/2015 0949   K 4.1 05/17/2015 0949   CL 109 05/17/2015 0949   CO2 26 05/17/2015 0949   BUN 15 05/17/2015 0949   CREATININE 0.72 05/17/2015 0949   CREATININE 0.68* 02/16/2015 1027      Component Value Date/Time   CALCIUM 8.4* 05/17/2015 0949   ALKPHOS 23* 05/17/2015 0949   AST 17 05/17/2015 0949   ALT 11* 05/17/2015 0949   BILITOT 0.4 05/17/2015 0949        PENDING LABS:   RADIOGRAPHIC STUDIES:  No results found.   PATHOLOGY:    ASSESSMENT AND PLAN:  Multiple myeloma without remission ([facility]) Multiple myeloma, monoclonal IgG kappa with scattered osseous lesions.  PET imaging on 08/11/2014 demonstrates significant response to therapy with repeat imaging on 03/02/2015  confirming stability of disease. Treatment now on hold due to potential diverticular abscess requiring surgical evaluation. Colonoscopy on 04/19/2015 demonstrated severe diverticulosis in the sigmoid colon, descending colon, and ascending colon by Dr. Oneida Alar.  Oncology history is updated.  We will continue with Zometa every 4 weeks; labs permitting.  He has an appointment with Dr. Johney Maine upcoming on 05/28/2015 for surgical consultation.  We'll continue to hold therapy until recommendation from general surgery is provided.   Multiple myeloma disease is very stable based upon peripheral laboratory work and imaging studies.  He requests a refill on Xanax and Flomax. Both prescriptions are provided today. He requests a refill on a "purple pill." I assumed it was Nexium but after the patient left and I was working on current dictation, I realize that Nexium is now his medication list nor has ever been prescribed within the [facility] system. I've asked nursing to call the patient and confirm the medication he needs a refill on in order to prevent ordering of the incorrect  medication and providing him necessary medications.  He'll return in 4 weeks' time for follow-up with Zometa (labs permitting).    THERAPY PLAN:  Multiple myeloma treatment will remain on hold until she is evaluated by Gen. surgery for possible diverticular abscess. We will continue with Zometa therapy every 4 weeks as this is not immunosuppressive. He has an appointment with Dr. Johney Maine ([facility]) on 05/28/2015.  All questions were answered. The patient knows to call the clinic with any problems, questions or concerns. We can certainly see the patient much sooner if necessary.  Patient and plan discussed with Dr. Ancil Linsey and she is in agreement with the aforementioned.   This note is electronically signed by: Doy Mince 05/17/2015 5:29 PM

## 2015-05-17 ENCOUNTER — Encounter (HOSPITAL_BASED_OUTPATIENT_CLINIC_OR_DEPARTMENT_OTHER): Payer: Medicare Other

## 2015-05-17 ENCOUNTER — Encounter (HOSPITAL_COMMUNITY): Payer: Medicare Other

## 2015-05-17 ENCOUNTER — Encounter (HOSPITAL_COMMUNITY): Payer: Medicare Other | Attending: Hematology and Oncology | Admitting: Oncology

## 2015-05-17 VITALS — BP 128/74 | HR 73 | Temp 97.7°F | Resp 18 | Ht 68.0 in | Wt 237.3 lb

## 2015-05-17 DIAGNOSIS — M899 Disorder of bone, unspecified: Secondary | ICD-10-CM | POA: Diagnosis not present

## 2015-05-17 DIAGNOSIS — M542 Cervicalgia: Secondary | ICD-10-CM | POA: Insufficient documentation

## 2015-05-17 DIAGNOSIS — C9000 Multiple myeloma not having achieved remission: Secondary | ICD-10-CM

## 2015-05-17 DIAGNOSIS — K219 Gastro-esophageal reflux disease without esophagitis: Secondary | ICD-10-CM | POA: Diagnosis not present

## 2015-05-17 DIAGNOSIS — D472 Monoclonal gammopathy: Secondary | ICD-10-CM | POA: Diagnosis not present

## 2015-05-17 DIAGNOSIS — R630 Anorexia: Secondary | ICD-10-CM | POA: Diagnosis not present

## 2015-05-17 DIAGNOSIS — R634 Abnormal weight loss: Secondary | ICD-10-CM | POA: Diagnosis not present

## 2015-05-17 DIAGNOSIS — G4700 Insomnia, unspecified: Secondary | ICD-10-CM

## 2015-05-17 DIAGNOSIS — N400 Enlarged prostate without lower urinary tract symptoms: Secondary | ICD-10-CM | POA: Diagnosis not present

## 2015-05-17 DIAGNOSIS — Z923 Personal history of irradiation: Secondary | ICD-10-CM | POA: Insufficient documentation

## 2015-05-17 LAB — CBC WITH DIFFERENTIAL/PLATELET
Basophils Absolute: 0 10*3/uL (ref 0.0–0.1)
Basophils Relative: 0 %
Eosinophils Absolute: 0 10*3/uL (ref 0.0–0.7)
Eosinophils Relative: 1 %
HCT: 39.9 % (ref 39.0–52.0)
Hemoglobin: 13.2 g/dL (ref 13.0–17.0)
Lymphocytes Relative: 18 %
Lymphs Abs: 1 10*3/uL (ref 0.7–4.0)
MCH: 29.2 pg (ref 26.0–34.0)
MCHC: 33.1 g/dL (ref 30.0–36.0)
MCV: 88.3 fL (ref 78.0–100.0)
Monocytes Absolute: 0.7 10*3/uL (ref 0.1–1.0)
Monocytes Relative: 14 %
Neutro Abs: 3.4 10*3/uL (ref 1.7–7.7)
Neutrophils Relative %: 67 %
Platelets: 157 10*3/uL (ref 150–400)
RBC: 4.52 MIL/uL (ref 4.22–5.81)
RDW: 13.6 % (ref 11.5–15.5)
WBC: 5.2 10*3/uL (ref 4.0–10.5)

## 2015-05-17 LAB — COMPREHENSIVE METABOLIC PANEL
ALT: 11 U/L — ABNORMAL LOW (ref 17–63)
AST: 17 U/L (ref 15–41)
Albumin: 3.2 g/dL — ABNORMAL LOW (ref 3.5–5.0)
Alkaline Phosphatase: 23 U/L — ABNORMAL LOW (ref 38–126)
Anion gap: 7 (ref 5–15)
BUN: 15 mg/dL (ref 6–20)
CO2: 26 mmol/L (ref 22–32)
Calcium: 8.4 mg/dL — ABNORMAL LOW (ref 8.9–10.3)
Chloride: 109 mmol/L (ref 101–111)
Creatinine, Ser: 0.72 mg/dL (ref 0.61–1.24)
GFR calc Af Amer: >60 mL/min (ref >60)
GFR calc non Af Amer: >60 mL/min (ref >60)
Glucose, Bld: 113 mg/dL — ABNORMAL HIGH (ref 65–99)
Potassium: 4.1 mmol/L (ref 3.5–5.1)
Sodium: 142 mmol/L (ref 135–145)
Total Bilirubin: 0.4 mg/dL (ref 0.3–1.2)
Total Protein: 6 g/dL — ABNORMAL LOW (ref 6.5–8.1)

## 2015-05-17 MED ORDER — ALPRAZOLAM 1 MG PO TABS: 1.0000 mg | ORAL_TABLET | Freq: Every evening | ORAL | Status: DC | PRN

## 2015-05-17 MED ORDER — SODIUM CHLORIDE 0.9 % IV SOLN
Freq: Once | INTRAVENOUS | Status: AC
  Administered 2015-05-17: 12:00:00 via INTRAVENOUS

## 2015-05-17 MED ORDER — ZOLEDRONIC ACID 4 MG/5ML IV CONC
4.0000 mg | Freq: Once | INTRAVENOUS | Status: AC
  Administered 2015-05-17: 4 mg via INTRAVENOUS
  Filled 2015-05-17: qty 5

## 2015-05-17 MED ORDER — SODIUM CHLORIDE 0.9 % IJ SOLN
10.0000 mL | INTRAMUSCULAR | Status: DC | PRN
  Administered 2015-05-17: 10 mL
  Filled 2015-05-17: qty 10

## 2015-05-17 MED ORDER — TAMSULOSIN HCL 0.4 MG PO CAPS: 0.4000 mg | ORAL_CAPSULE | Freq: Every day | ORAL | Status: DC

## 2015-05-17 NOTE — Patient Instructions (Signed)
Dean [name] at [facility] Discharge Instructions  RECOMMENDATIONS MADE BY THE CONSULTANT AND ANY TEST RESULTS WILL BE SENT TO YOUR REFERRING PHYSICIAN.  PT seen and eval with Kirby Crigler, PA today. Follow up with Surgeon as scheduled on 05/28/15. Labs are good today. We will call in Nexium prescription to your pharmacy. Zometa today and in 4 weeks with labs. Return 4 weeks for follow-up with Dr. Whitney Muse. Hold your myeloma pills at home for now.    Thank you for choosing [place] at [facility] to provide your oncology and hematology care.  To afford each patient quality time with our provider, please arrive at least 15 minutes before your scheduled appointment time.   Beginning January 23rd 2017 lab work for the [facility] will be done in the  Main lab at [facility] on 1st floor. If you have a lab appointment with the [place] please come in thru the  Main Entrance and check in at the main information desk  You need to re-schedule your appointment should you arrive 10 or more minutes late.  We strive to give you quality time with our providers, and arriving late affects you and other patients whose appointments are after yours.  Also, if you no show three or more times for appointments you may be dismissed from the clinic at the providers discretion.     Again, thank you for choosing [facility]  Our hope is that these requests will decrease the amount of time that you wait before being seen by our physicians.       _____________________________________________________________  Should you have questions after your visit to [facility], please contact our office at [phone] between the hours of 8:30 a.m. and 4:30 p.m.  Voicemails left after 4:30 p.m. will not be returned until the following business day.  For prescription refill requests, have your pharmacy contact our office.         Resources For  Cancer Patients and their Caregivers ? American Cancer Society: Can assist with transportation, wigs, general needs, runs Look Good Feel Better.        (360)044-8859 ? Cancer Care: Provides financial assistance, online support groups, medication/co-pay assistance.  1-800-813-HOPE 629-622-0171) ? [place] Assists [place] cancer patients and their families through emotional , educational and financial support.  435-406-2995 ? [place] DSS Where to apply for food stamps, Medicaid and utility assistance. 360-743-9051 ? [facility]: Transportation to medical appointments. (210)614-4295 ? Social Security Administration: May apply for disability if have a Stage IV cancer. 9384168869 (719)293-6129 ? [employer], Disability and Transit Services: Assists with nutrition, care and transit needs. [phone]

## 2015-05-17 NOTE — Progress Notes (Signed)
Tolerated infusion well. Ambulatory on discharge home with wife.

## 2015-05-18 LAB — IGG, IGA, IGM
IgA: 108 mg/dL (ref 61–437)
IgG (Immunoglobin G), Serum: 601 mg/dL — ABNORMAL LOW (ref 700–1600)
IgM, Serum: 86 mg/dL (ref 15–143)

## 2015-05-18 LAB — IMMUNOFIXATION ELECTROPHORESIS
IgA: 111 mg/dL (ref 61–437)
IgG (Immunoglobin G), Serum: 588 mg/dL — ABNORMAL LOW (ref 700–1600)
IgM, Serum: 84 mg/dL (ref 15–143)
Total Protein ELP: 5.4 g/dL — ABNORMAL LOW (ref 6.0–8.5)

## 2015-05-18 LAB — KAPPA/LAMBDA LIGHT CHAINS
Kappa free light chain: 20.26 mg/L — ABNORMAL HIGH (ref 3.30–19.40)
Kappa, lambda light chain ratio: 1.17 (ref 0.26–1.65)
Lambda free light chains: 17.38 mg/L (ref 5.71–26.30)

## 2015-05-18 LAB — BETA 2 MICROGLOBULIN, SERUM: Beta-2 Microglobulin: 1.9 mg/L (ref 0.6–2.4)

## 2015-05-21 ENCOUNTER — Other Ambulatory Visit (HOSPITAL_COMMUNITY): Payer: Self-pay | Admitting: Oncology

## 2015-05-21 DIAGNOSIS — K219 Gastro-esophageal reflux disease without esophagitis: Secondary | ICD-10-CM

## 2015-05-21 MED ORDER — OMEPRAZOLE 20 MG PO CPDR
20.0000 mg | DELAYED_RELEASE_CAPSULE | Freq: Every day | ORAL | Status: DC
Start: 2015-05-21 — End: 2016-08-21

## 2015-05-28 ENCOUNTER — Other Ambulatory Visit: Payer: Self-pay | Admitting: Surgery

## 2015-05-28 DIAGNOSIS — K5720 Diverticulitis of large intestine with perforation and abscess without bleeding: Secondary | ICD-10-CM | POA: Diagnosis not present

## 2015-05-28 DIAGNOSIS — Z01818 Encounter for other preprocedural examination: Secondary | ICD-10-CM | POA: Diagnosis not present

## 2015-05-28 NOTE — Progress Notes (Signed)
REVIEWED-NO ADDITIONAL RECOMMENDATIONS. 

## 2015-05-28 NOTE — H&P (Signed)
Dean [name] 05/28/2015 9:30 AM Location: [facility] Patient #: [id number] DOB: 11/17/1935 Married / Language: Cleophus Molt / Race: White Male  History of Present Illness Dean Hector MD; 05/28/2015 10:48 AM) The patient is a 80 year old male who presents with diverticulitis. Note for "Diverticulitis": Pleasant patient sent over concerns of diverticulitis with large abscess in December 2016.  Pleasant obese male. Developed worsening fatigue and bowel changes. Being treated for multiple myeloma. Found to have evidence of diverticulitis on a PET scan fifth of December. I believe placed on antibiotics. Worsened. Admitted 2 weeks later in December with diverticulitis with a 6.5 cm abscesses. Required IV antibiotics and percutaneous drainage. Eventually the abscess resolve and was removed in the outpatient setting. He never followed up with [place]. Apparently had CT scan a month later that showed no diverticulitis. PET scan couple weeks later showed mild inflammation but no abscess. His family brought him in today. He is adamantly against any potential ostomy.  Since he's left the hospital, he is feeling better. Appetite somewhat down and still little weak but recovering. Moves his bowels most days. He did get a colonoscopy by Dr. Lovey Newcomer fields last month which showed diverticulosis. No cancer. Patient's never had any abdominal surgery. [place] has to stop. His pain or shortness of breath. He notes he has a cousin that required emergency surgery with a colostomy. Cancer. He's apparently had "17 operations". Recurrent parastomal hernias. Incisional hernias. Intra-abdominal abscesses. He is convinced that he will have those problems if he has surgery. He does not smoke. He is not a diabetic. Somewhat compliant with his doctors.  Also, he was diagnosed with multiple myeloma. His felt he could benefit from treatment but that is  on hold given the fact he had this significant diverticulitis episode. Concern of recurrent infections and abscesses should he go back on that. Therefore, surgical consultation requested to reevaluate the patient by his oncologist, Dr. Whitney Muse. Also recommended by Dr. Oneida Alar, his gastroenterologist. Also recommended by his primary care physician, Dr. Dennard Schaumann.  No personal nor family history of GI/colon cancer, inflammatory bowel disease, irritable bowel syndrome, allergy such as Celiac Sprue, dietary/dairy problems, colitis, ulcers nor gastritis. No recent sick contacts/gastroenteritis. No travel outside the country. No changes in diet. No dysphagia to solids or liquids. No significant heartburn or reflux. No hematochezia, hematemesis, coffee ground emesis. No evidence of prior gastric/peptic ulceration.   Other Problems Dean [name], CMA; 05/28/2015 9:31 AM) Arthritis Back Pain Cancer  Diagnostic Studies History Dean [name], CMA; 05/28/2015 9:31 AM) Colonoscopy within last year  Allergies Dean [name], CMA; 05/28/2015 9:31 AM) Codeine Sulfate *ANALGESICS - OPIOID* Morphine Sulfate (Concentrate) *ANALGESICS - OPIOID*  Medication History Dean [name], CMA; 05/28/2015 9:33 AM) ALPRAZolam (1MG Tablet, Oral) Active. Calcium-Vitamin D (600MG Tablet Chewable, Oral) Active. Aspirin (81MG Tablet, Oral) Active. Benadryl Allergy (25MG Capsule, Oral) Active. EpiPen 2-Pak (0.3MG/0.3ML Soln Auto-inj, Injection as needed) Active. Famotidine (20MG Tablet, Oral) Active. Gabapentin (400MG Capsule, Oral) Active. Omeprazole (20MG Capsule DR, Oral) Active. Tamsulosin HCl (0.4MG Capsule, Oral) Active. Zometa (4MG/100ML Solution, Intravenous once a month) Active. Medications Reconciled  Social History Dean [name], [place]; 05/28/2015 9:31 AM) Alcohol use Remotely quit alcohol use. Caffeine use Carbonated beverages, Coffee, Tea. No drug use Tobacco use Never smoker.  Family  History Dean [name], [place]; 05/28/2015 9:31 AM) Alcohol Abuse Brother. Arthritis Father, Mother. Hypertension Father, Mother. Kidney Disease Daughter.     Review of Systems Dean [name]  CMA; 05/28/2015 9:31 AM) General Present- Appetite Loss and Fatigue. Not Present- Chills, Fever, Night Sweats, Weight Gain and Weight Loss. Skin Not Present- Change in Wart/Mole, Dryness, Hives, Jaundice, New Lesions, Non-Healing Wounds, Rash and Ulcer. HEENT Present- Seasonal Allergies. Not Present- Earache, Hearing Loss, Hoarseness, Nose Bleed, Oral Ulcers, Ringing in the Ears, Sinus Pain, Sore Throat, Visual Disturbances, Wears glasses/contact lenses and Yellow Eyes. Respiratory Not Present- Bloody sputum, Chronic Cough, Difficulty Breathing, Snoring and Wheezing. Breast Not Present- Breast Mass, Breast Pain, Nipple Discharge and Skin Changes. Cardiovascular Present- Leg Cramps and Swelling of Extremities. Not Present- Chest Pain, Difficulty Breathing Lying Down, Palpitations, Rapid Heart Rate and Shortness of Breath. Male Genitourinary Not Present- Blood in Urine, Change in Urinary Stream, Frequency, Impotence, Nocturia, Painful Urination, Urgency and Urine Leakage. Musculoskeletal Present- Joint Pain, Muscle Weakness and Swelling of Extremities. Not Present- Back Pain, Joint Stiffness and Muscle Pain. Neurological Present- Numbness. Not Present- Decreased Memory, Fainting, Headaches, Seizures, Tingling, Tremor, Trouble walking and Weakness. Psychiatric Not Present- Anxiety, Bipolar, Change in Sleep Pattern, Depression, Fearful and Frequent crying. Endocrine Not Present- Cold Intolerance, Excessive Hunger, Hair Changes, Heat Intolerance, Hot flashes and New Diabetes. Hematology Not Present- Easy Bruising, Excessive bleeding, Gland problems, HIV and Persistent Infections.  Vitals Dean [name] CMA; 05/28/2015 9:34 AM) 05/28/2015 9:33 AM Weight: 237 lb Temp.: 97.79F  Pulse: 80 (Regular)  BP: 138/82  (Sitting, Left Arm, Standard)      Physical Exam Dean Hector MD; 05/28/2015 10:11 AM)  General Mental Status-Alert. General Appearance-Not in acute distress, Not Sickly. Orientation-Oriented X3. Hydration-Well hydrated. Voice-Normal.  Integumentary Global Assessment Upon inspection and palpation of skin surfaces of the - Axillae: non-tender, no inflammation or ulceration, no drainage. and Distribution of scalp and body hair is normal. General Characteristics Temperature - normal warmth is noted.  Head and Neck Head-normocephalic, atraumatic with no lesions or palpable masses. Face Global Assessment - atraumatic, no absence of expression. Neck Global Assessment - no abnormal movements, no bruit auscultated on the right, no bruit auscultated on the left, no decreased range of motion, non-tender. Trachea-midline. Thyroid Gland Characteristics - non-tender.  Eye Eyeball - Left-Extraocular movements intact, No Nystagmus. Eyeball - Right-Extraocular movements intact, No Nystagmus. Cornea - Left-No Hazy. Cornea - Right-No Hazy. Sclera/Conjunctiva - Left-No scleral icterus, No Discharge. Sclera/Conjunctiva - Right-No scleral icterus, No Discharge. Pupil - Left-Direct reaction to light normal. Pupil - Right-Direct reaction to light normal.  ENMT Ears Pinna - Left - no drainage observed, no generalized tenderness observed. Right - no drainage observed, no generalized tenderness observed. Nose and Sinuses External Inspection of the Nose - no destructive lesion observed. Inspection of the nares - Left - quiet respiration. Right - quiet respiration. Mouth and Throat Lips - Upper Lip - no fissures observed, no pallor noted. Lower Lip - no fissures observed, no pallor noted. Nasopharynx - no discharge present. Oral Cavity/Oropharynx - Tongue - no dryness observed. Oral Mucosa - no cyanosis observed. Hypopharynx - no evidence of airway distress  observed.  Chest and Lung Exam Inspection Movements - Normal and Symmetrical. Accessory muscles - No use of accessory muscles in breathing. Palpation Palpation of the chest reveals - Non-tender. Auscultation Breath sounds - Normal and Clear.  Cardiovascular Auscultation Rhythm - Regular. Murmurs & Other Heart Sounds - Auscultation of the heart reveals - No Murmurs and No Systolic Clicks.  Abdomen Inspection Inspection of the abdomen reveals - No Visible peristalsis and No Abnormal pulsations. Umbilicus - No Bleeding, No Urine drainage. Palpation/Percussion Palpation and  Percussion of the abdomen reveal - Soft, Non Tender, No Rebound tenderness, No Rigidity (guarding) and No Cutaneous hyperesthesia. Note: Morbidly obese but soft. No guarding. No rebound tenderness. No umbilical hernia. Mild diastases.  Male Genitourinary Sexual Maturity Tanner 5 - Adult hair pattern and Adult penile size and shape. Note: No inguinal hernias. Normal external genitalia.  Peripheral Vascular Upper Extremity Inspection - Left - No Cyanotic nailbeds, Not Ischemic. Right - No Cyanotic nailbeds, Not Ischemic.  Neurologic Neurologic evaluation reveals -normal attention span and ability to concentrate, able to name objects and repeat phrases. Appropriate fund of knowledge , normal sensation and normal coordination. Mental Status Affect - not angry, not paranoid. Cranial Nerves-Normal Bilaterally. Gait-Normal.  Neuropsychiatric Mental status exam performed with findings of-able to articulate well with normal speech/language, rate, volume and coherence, thought content normal with ability to perform basic computations and apply abstract reasoning and no evidence of hallucinations, delusions, obsessions or homicidal/suicidal ideation.  Musculoskeletal Global Assessment Spine, Ribs and Pelvis - no instability, subluxation or laxity. Right Upper Extremity - no instability, subluxation or  laxity.  Lymphatic Head & Neck  General Head & Neck Lymphatics: Bilateral - Description - No Localized lymphadenopathy. Axillary  General Axillary Region: Bilateral - Description - No Localized lymphadenopathy. Femoral & Inguinal  Generalized Femoral & Inguinal Lymphatics: Left - Description - No Localized lymphadenopathy. Right - Description - No Localized lymphadenopathy.    Assessment & Plan Dean Hector MD; 05/28/2015 10:48 AM)  DIVERTICULITIS OF LARGE INTESTINE WITH ABSCESS WITHOUT BLEEDING (K57.20) Impression: Obese male with diverticulitis with 6.5 cm large abscess requiring prolonged hospital stay, IV antibiotics, percutaneous drainage. Some persistent malaise but no evidence of recurrence. Also has multiple myeloma. Not able to do chemotherapy given concerns of recurrent attack.  Standard of care would be segmental sigmoid colectomy. He's adamantly against any colostomy. I noted that there is a 95% chance we could do this electively and that anastomosis will heal, avoiding colostomy; but, I cautioned him that I cannot give 100% guarantee. He is obsessed with his cousin's issues of emergency colon surgeries for cancer with multiple problems including recurrent hernias, intra-abdominal abscesses, etc. I noted the best way to minimize any future problems and allow him to proceed with chemotherapy for his myeloma is to proceed with elective resection. Reasonable robotic approach. He is interested. His performance status seems tolerable. We'll work to coordinate a convenient time.  ENCOUNTER FOR PREOPERATIVE EXAMINATION FOR GENERAL SURGICAL PROCEDURE (Z01.818)  Current Plans You are being scheduled for surgery - Our schedulers will call you.  You should hear from our office's scheduling department within 5 working days about the location, date, and time of surgery. We try to make accommodations for patient's preferences in scheduling surgery, but sometimes the OR schedule or the  surgeon's schedule prevents [place] from making those accommodations.  If you have not heard from our office [phone]) in 5 working days, call the office and ask for your surgeon's nurse.  If you have other questions about your diagnosis, plan, or surgery, call the office and ask for your surgeon's nurse.  Written instructions provided Pt Education - [facility] Colon Bowel Prep 2015 Miralax/Antibiotics Started Neomycin Sulfate 500MG, 2 (two) Tablet SEE NOTE, #6, 05/28/2015, No Refill. Local Order: TAKE TWO TABLETS AT 2 PM, 3 PM, AND 10 PM THE DAY PRIOR TO SURGERY Started Flagyl 500MG, 2 (two) Tablet SEE NOTE, #6, 05/28/2015, No Refill. Local Order: Take at 2pm, 3pm, and 10pm the day prior to your colon operation  Pt Education - [facility] Colectomy post-op instructions: discussed with patient and provided information. Pt Education - Pamphlet Given - Laparoscopic Colorectal Surgery: discussed with patient and provided information. Pt Education - [facility] Good Bowel Health (Franciso Dierks) Pt Education - [facility] Pain Control (Waqas Bruhl)  Dean [name], M.D., F.A.C.S. Gastrointestinal and Minimally Invasive Surgery [facility] [address], [place], [place] [address] [phone] Main / Paging

## 2015-05-31 ENCOUNTER — Other Ambulatory Visit: Payer: Self-pay | Admitting: Surgery

## 2015-05-31 DIAGNOSIS — K5720 Diverticulitis of large intestine with perforation and abscess without bleeding: Secondary | ICD-10-CM

## 2015-06-01 ENCOUNTER — Inpatient Hospital Stay: Admission: RE | Admit: 2015-06-01 | Payer: Medicare Other | Source: Ambulatory Visit

## 2015-06-04 ENCOUNTER — Ambulatory Visit
Admission: RE | Admit: 2015-06-04 | Discharge: 2015-06-04 | Disposition: A | Discharge status: Home / Self Care | Payer: Medicare Other | Source: Ambulatory Visit | Attending: Surgery | Admitting: Surgery

## 2015-06-04 DIAGNOSIS — N200 Calculus of kidney: Secondary | ICD-10-CM | POA: Diagnosis not present

## 2015-06-04 DIAGNOSIS — K5720 Diverticulitis of large intestine with perforation and abscess without bleeding: Secondary | ICD-10-CM

## 2015-06-04 MED ORDER — IOPAMIDOL (ISOVUE-300) INJECTION 61%
125.0000 mL | Freq: Once | INTRAVENOUS | Status: AC | PRN
  Administered 2015-06-04: 125 mL via INTRAVENOUS

## 2015-06-04 NOTE — Progress Notes (Signed)
Quick Note:  Patient with history of diverticulitis with large abscess. Tx'd. Recurrent abdominal pain. CT scan repeated. No evidence of any active inflammation. No abscess. No diverticulitis. No perforation. Reassuring.  Alisha CCS MA, please call and reassure the patient. See if the pt is interested in proceeding with elective colectomy to help get rid of the chronic area of recurrent diverticulitis & avoid future attacks.  Thanks,  [name], M.D., F.A.C.S. Gastrointestinal and Minimally Invasive Surgery [facility] [address], [place], [place] [address] [phone] Main / Paging   ______

## 2015-06-14 ENCOUNTER — Encounter (HOSPITAL_COMMUNITY): Payer: Self-pay | Admitting: Hematology & Oncology

## 2015-06-14 ENCOUNTER — Encounter (HOSPITAL_COMMUNITY): Payer: Medicare Other | Attending: Hematology and Oncology | Admitting: Hematology & Oncology

## 2015-06-14 ENCOUNTER — Encounter (HOSPITAL_COMMUNITY): Payer: Medicare Other

## 2015-06-14 ENCOUNTER — Encounter (HOSPITAL_BASED_OUTPATIENT_CLINIC_OR_DEPARTMENT_OTHER): Payer: Medicare Other

## 2015-06-14 VITALS — BP 124/60 | HR 80 | Temp 98.1°F | Resp 18 | Wt 234.0 lb

## 2015-06-14 DIAGNOSIS — N400 Enlarged prostate without lower urinary tract symptoms: Secondary | ICD-10-CM | POA: Diagnosis not present

## 2015-06-14 DIAGNOSIS — K219 Gastro-esophageal reflux disease without esophagitis: Secondary | ICD-10-CM | POA: Insufficient documentation

## 2015-06-14 DIAGNOSIS — M542 Cervicalgia: Secondary | ICD-10-CM | POA: Diagnosis not present

## 2015-06-14 DIAGNOSIS — R630 Anorexia: Secondary | ICD-10-CM | POA: Diagnosis not present

## 2015-06-14 DIAGNOSIS — R634 Abnormal weight loss: Secondary | ICD-10-CM | POA: Diagnosis not present

## 2015-06-14 DIAGNOSIS — K5730 Diverticulosis of large intestine without perforation or abscess without bleeding: Secondary | ICD-10-CM

## 2015-06-14 DIAGNOSIS — D472 Monoclonal gammopathy: Secondary | ICD-10-CM | POA: Insufficient documentation

## 2015-06-14 DIAGNOSIS — G4700 Insomnia, unspecified: Secondary | ICD-10-CM | POA: Diagnosis not present

## 2015-06-14 DIAGNOSIS — Z923 Personal history of irradiation: Secondary | ICD-10-CM | POA: Insufficient documentation

## 2015-06-14 DIAGNOSIS — M899 Disorder of bone, unspecified: Secondary | ICD-10-CM | POA: Diagnosis not present

## 2015-06-14 DIAGNOSIS — C9000 Multiple myeloma not having achieved remission: Secondary | ICD-10-CM

## 2015-06-14 LAB — COMPREHENSIVE METABOLIC PANEL
ALT: 11 U/L — ABNORMAL LOW (ref 17–63)
AST: 16 U/L (ref 15–41)
Albumin: 3.5 g/dL (ref 3.5–5.0)
Alkaline Phosphatase: 20 U/L — ABNORMAL LOW (ref 38–126)
Anion gap: 8 (ref 5–15)
BUN: 17 mg/dL (ref 6–20)
CO2: 25 mmol/L (ref 22–32)
Calcium: 8.9 mg/dL (ref 8.9–10.3)
Chloride: 106 mmol/L (ref 101–111)
Creatinine, Ser: 0.73 mg/dL (ref 0.61–1.24)
GFR calc Af Amer: >60 mL/min (ref >60)
GFR calc non Af Amer: >60 mL/min (ref >60)
Glucose, Bld: 127 mg/dL — ABNORMAL HIGH (ref 65–99)
Potassium: 4 mmol/L (ref 3.5–5.1)
Sodium: 139 mmol/L (ref 135–145)
Total Bilirubin: 0.4 mg/dL (ref 0.3–1.2)
Total Protein: 6.3 g/dL — ABNORMAL LOW (ref 6.5–8.1)

## 2015-06-14 MED ORDER — SODIUM CHLORIDE 0.9 % IV SOLN
Freq: Once | INTRAVENOUS | Status: AC
Start: 2015-06-14 — End: 2015-06-14
  Administered 2015-06-14: 10:00:00 via INTRAVENOUS

## 2015-06-14 MED ORDER — SODIUM CHLORIDE 0.9 % IJ SOLN: 10.0000 mL | INTRAMUSCULAR | Status: DC | PRN

## 2015-06-14 MED ORDER — HEPARIN SOD (PORK) LOCK FLUSH 100 UNIT/ML IV SOLN: 500.0000 [IU] | Freq: Once | INTRAVENOUS | Status: DC | PRN

## 2015-06-14 MED ORDER — ALPRAZOLAM 1 MG PO TABS: 1.0000 mg | ORAL_TABLET | Freq: Every evening | ORAL | Status: DC | PRN

## 2015-06-14 MED ORDER — ZOLEDRONIC ACID 4 MG/5ML IV CONC
4.0000 mg | Freq: Once | INTRAVENOUS | Status: AC
  Administered 2015-06-14: 4 mg via INTRAVENOUS
  Filled 2015-06-14: qty 5

## 2015-06-14 NOTE — Progress Notes (Signed)
Dean Fraction, MD [address] [place] [address]  Multiple Myeloma, IgG kappa  Left occipital condyle infiltrative lesion, not amenable to biopsy. Palliative XRT without a tissue diagnosis  Chest x-ray and right rib films were obtained which showed a soft tissue mass along the right posterior fifth rib with some rib destruction.  Monoclonal IgG kappa, normal IgG levels, suppressed IgM, abnormal kappa/lamda ratio  PET/CT  03/16/2014 Scattered hypermetabolic osseous lesions, including the calvarium, ribs, left scapula, sacrum, and left femoral shaft. An expansile left vertebral body lesion at L3 extends into the epidural space of the left lateral recess, and probably merits lumbar spine MRI assessment.  BMBX on 02/20/2014 with 33% kappa restricted plasma cells Normal FISH, normal cytogenetics  Peripheral evaluation shows very small M spike, normal CBC, normal CMP, myeloma survey grossly underestimates extent of bone involvement   Multiple myeloma without remission ([place])   11/16/2012 Initial Diagnosis L occipital condyle destructive lesion, not biopsied secondary to location. XRT   12/01/2012 Imaging L3 superior endplate compression FX, no lytic lesions to suggest myeloma   02/06/2014 Imaging soft tissue mass along the right posterior fifth rib with some rib destruction   02/16/2014 Miscellaneous Normal CBC, Normal CMP, kappa,lamda ratio of 2.62 (abnormal), UIEP with slightly restricted band, IgG kappa, SPEP/IEP with monoclonal protein at 0.79 g/dl, normal igG, suppressed IGM   02/20/2014 Bone Marrow Biopsy 33% kappa restricted plasma cells Normal FISH, normal cytogenetics   02/21/2014 - 03/08/2014 Radiation Therapy 30Gy to R rib lesion, lesion not biopsied   03/16/2014 PET scan Scattered hypermetabolic osseous lesions, including the calvarium, ribs, left scapula, sacrum, and left femoral shaft. An expansile left vertebral body lesion at L3 extends into the epidural  space of the left lateral recess,    03/17/2014 - 04/07/2014 Chemotherapy Velcade and Dexamethasone due to initial denial of Revlimid by insurance company.  Zometa monthly.   03/22/2014 Imaging MR_ L-Spine- Enhancing lesions compatible with multiple myeloma at L2, L3, and S1-2.   04/07/2014 - 07/28/2014 Chemotherapy RVD with Revlimid at 25 mg days 1-14.  Revlimid dose reduced to 25 mg every other day x 14 days with 7 day respite beginning on day 1 of cycle 2.   04/17/2014 Adverse Reaction Revlimid-induced rash.  Treated with steroids.   07/28/2014 - 08/04/2014 Chemotherapy Revlimid daily   08/04/2014 Adverse Reaction Velcade-induced peripheral neuropathy   08/04/2014 Treatment Plan Change D/C Velcade   08/11/2014 PET scan Response to therapy. Improvement and resolution of foci of osseous hypermetabolism.   08/22/2014 - 08/28/2014 Chemotherapy Revlimid 25 mg days 1-21 every 28 days   08/28/2014 Adverse Reaction Revlimid-induced rash. Medication held.  Medrol dose Pak prescribed.   09/04/2014 - 01/08/2015 Chemotherapy Revlimid 10 mg every other day, without dexamethasone   01/08/2015 - 01/15/2015 Hospital Admission Colonic diverticular abscess with IR drain placement   03/02/2015 PET scan Only bony uptake is low activity at site of deformity/callus at R second rib FX, high activity in sigmoid colon, advanced sigmoid divertidulosis. Abscess not resolved?   06/04/2015 Imaging CT nonobstructive L renal calculus, diverticulosis of descending and sigmoid colon without inflammation, moderate prostatic enlargement    CURRENT THERAPY: Zometa  INTERVAL HISTORY: Dean [name] 80 y.o. male returns for followup of multiple myeloma, monoclonal IgG kappa with scattered osseous lesions. He has sigmoid diverticulosis and has undergone treatment for a diverticular abscess. He is finally scheduled for a partial colectomy with Dr. Johney Maine on 6/7.  Mr.  [name] was here with his wife today.  He has been eating better. He still cannot eat  what he wants. He has been trying to eat meat and beef.  He is trying to stay active. He works about 7-8 hours a day working on [employer].   He has been having some abdominal pain. It feels like a pinch in certain areas this does not happen all of the time. He thinks that this might be because of nerves. He confirms that he has a lot on his mind.   His wife said that he was having a fit about having an ostomy bag. He notes that he is pretty sure that is not part of his surgical plan.  He takes 1 and a half pill of Xanax and Gabapentin and this helps him sleep at night. He notes this is the first time he has been sleeping well in some time. He has tried multiple other agents to help with sleep in the past.   He denies any hematochezia, nausea, vomiting or any new bone pain.   He needs a refill on his Xanax prescription.   Past Medical History  Diagnosis Date  . Neuropathy ([place])   . BPH (benign prostatic hyperplasia)   . GERD (gastroesophageal reflux disease)   . H/O ETOH abuse   . Skull lesion     Left occipital condyle  . History of radiation therapy 01/05/13-02/10/13    45 gray to left occipital condyle region  . Multiple myeloma ([place])   . Radiation 02/21/14-03/08/14    right posterior chest wall area 30 gray  . Radiation 04/19/14-05/02/14    lumbar spine 25 gray  . Diverticulitis 03/00/92    complicated by abscess and required percutaneous drainage    has Neuropathy ([place]); BPH (benign prostatic hyperplasia); GERD (gastroesophageal reflux disease); H/O ETOH abuse; Skull lesion; Neck pain; Fatigue; Loss of weight; Insomnia; Allergic rhinitis; Lumbar compression fracture ([place]); Dermatitis; Multiple myeloma without remission ([place]); Colonic diverticular abscess; Thrombocytopenia ([place]); Intra-abdominal abscess ([place]); Abnormal CT scan, sigmoid colon; Abnormal PET scan of colon; and Special screening for malignant neoplasms, colon on his problem list.     is allergic to codeine and  morphine and related.  Dean [name] had no medications administered during this visit.  Past Surgical History  Procedure Laterality Date  . Cyst excision  1959    tail bone  . Colonoscopy N/A 04/19/2015    Procedure: COLONOSCOPY;  Surgeon: Danie Binder, MD;  Location: [facility];  Service: Endoscopy;  Laterality: N/A;  1:30 PM   Positive for abdominal pain and nervous/anxious. Abdominal pain feels like pinching in certain areas. This does not happen all the time. Nervous about upcoming surgery.  Denies any headaches, dizziness, double vision, fevers, chills, night sweats, trouble sleeping, nausea, vomiting, chest pain, heart palpitations, shortness of breath, blood in stool, black tarry stool, urinary pain, urinary burning, urinary frequency, hematuria. 14 point review of systems was performed and is negative except as detailed under history of present illness and above  PHYSICAL EXAMINATION  ECOG PERFORMANCE STATUS: 0 - Asymptomatic   Filed Vitals:   06/14/15 0941  BP: 124/60  Pulse: 80  Temp: 98.1 F (36.7 C)  Resp: 18    GENERAL:alert, no distress, well nourished, well developed, comfortable, cooperative, obese and smiling SKIN: skin color, texture, turgor are normal, no rashes or significant lesions  HEAD: Normocephalic, No masses, lesions, tenderness or abnormalities EYES: normal, PERRLA, EOMI, Conjunctiva are pink and non-injected. Wears  dark sunglasses all the time EARS: External ears normal OROPHARYNX:lips, buccal mucosa, and tongue normal and mucous membranes are moist  NECK: supple, no adenopathy, thyroid normal size, non-tender, without nodularity, no stridor, non-tender, trachea midline LYMPH:  no palpable lymphadenopathy BREAST:not examined LUNGS: clear to auscultation  HEART: regular rate & rhythm, no murmurs and no gallops ABDOMEN:abdomen soft, non-tender, obese and normal bowel sounds BACK: Back symmetric, no curvature., No CVA tenderness EXTREMITIES:less  then 2 second capillary refill, no joint deformities, effusion, or inflammation, no edema, no skin discoloration, no clubbing, no cyanosis  NEURO: alert & oriented x 3 with fluent speech, no focal motor/sensory deficits, gait normal   LABORATORY DATA: I have reviewed the labs below. CBC    Component Value Date/Time   WBC 5.2 05/17/2015 0949   RBC 4.52 05/17/2015 0949   HGB 13.2 05/17/2015 0949   HCT 39.9 05/17/2015 0949   PLT 157 05/17/2015 0949   MCV 88.3 05/17/2015 0949   MCH 29.2 05/17/2015 0949   MCHC 33.1 05/17/2015 0949   RDW 13.6 05/17/2015 0949   LYMPHSABS 1.0 05/17/2015 0949   MONOABS 0.7 05/17/2015 0949   EOSABS 0.0 05/17/2015 0949   BASOSABS 0.0 05/17/2015 0949      Chemistry      Component Value Date/Time   NA 139 06/14/2015 0853   K 4.0 06/14/2015 0853   CL 106 06/14/2015 0853   CO2 25 06/14/2015 0853   BUN 17 06/14/2015 0853   CREATININE 0.73 06/14/2015 0853   CREATININE 0.68* 02/16/2015 1027      Component Value Date/Time   CALCIUM 8.9 06/14/2015 0853   ALKPHOS 20* 06/14/2015 0853   AST 16 06/14/2015 0853   ALT 11* 06/14/2015 0853   BILITOT 0.4 06/14/2015 0853      RADIOGRAPHY: I have personally reviewed the radiological images as listed and agreed with the findings in the report.   Study Result     CLINICAL DATA: Subsequent treatment strategy for multiple myeloma.  EXAM: NUCLEAR MEDICINE PET WHOLE BODY  TECHNIQUE: 11.7 mCi F-18 FDG was injected intravenously. Full-ring PET imaging was performed from the vertex to the feet after the radiotracer. CT data was obtained and used for attenuation correction and anatomic localization.  FASTING BLOOD GLUCOSE: Value: 79 mg/dl  COMPARISON: 12/22/2014  FINDINGS: Head/Neck: High activity in the scalp, and in the nose, likely physiologic. No adenopathy in the neck or significant worrisome bony lesions. Grossly normal intracranial activity in the brain.  Chest: There is high activity in  a mid thoracic intervertebral spurring anteriorly, maximum standard uptake value 10.4, probably inflammatory given the presence of this finding in a spur.  Coronary, aortic arch, and branch vessel atherosclerotic vascular disease. There is evidence of old granulomatous disease. Scarring posteriorly in the right upper lobe similar to prior.  Abdomen/Pelvis: Sigmoid diverticulosis with high activity in the sigmoid colon. There continues to be a small band of density with a small amount of gas density anterior to the sigmoid colon, potentially representing a tiny residual abscess or an unusual diverticulum with surrounding chronic inflammatory findings. The appearance is less striking than on the prior exam but is still visible on images 192-193 of series 4.  Prominent prostate gland, 6.0 by 5.1 cm on image 219 series 4.  Skeleton: Faintly increased uptake laterally at the deformity/callus in the right second rib, maximum standard uptake value 5.4, no change in the CT appearance. There is deformity of the right fifth rib with some expansile component but without hypermetabolic  activity posterolaterally, CT abnormality shown on image 91 series for and not appreciably changed in morphology. No lesions in the visualized appendicular skeleton. No hypermetabolic activity at the L3 lesion, the right sacral lesion, or any of the other prior identified lesions.  Extremities: No hypermetabolic activity to suggest metastasis.  IMPRESSION: 1. The only currently active bony uptake is low grade activity at the site of the deformity/callus at the right second rib fracture. This may have been a pathologic fracture originally. There is some callus formation and sclerosis along with some central lucency at this site. Maximum standard uptake value is 5.4. None of the other prior bony lesions demonstrate any significant hypermetabolic activity. 2. There is some high activity in a mid thoracic  intervertebral spur which is highly likely to be inflammatory rather than neoplastic. 3. There continues to be high activity in the sigmoid colon along with fairly advanced sigmoid colon diverticulosis. If the patient has not had recent colonoscopy thin it may be prudent to ensure lack of tumor in the sigmoid colon. Unusual diverticulum or small abscess anterior to the sigmoid colon is reduced in size and prominence but not completely resolved, with a tiny amount of potentially extra locular gas detailed above.   Electronically Signed  By: Van Clines M.D.  On: 03/02/2015 15:14    Ct Abdomen Pelvis W Contrast  06/04/2015  CLINICAL DATA:  Left-sided abdominal pain. EXAM: CT ABDOMEN AND PELVIS WITH CONTRAST TECHNIQUE: Multidetector CT imaging of the abdomen and pelvis was performed using the standard protocol following bolus administration of intravenous contrast. CONTRAST:  171m ISOVUE-300 IOPAMIDOL (ISOVUE-300) INJECTION 61% COMPARISON:  CT scan of February 22, 2015. FINDINGS: Multilevel degenerative disc disease is noted in the lumbar spine, as well as old compression fracture of L4. Visualized lung bases are unremarkable. No gallstones are noted. The liver, spleen and pancreas are unremarkable. Adrenal glands appear normal. Nonobstructive calculus is noted in lower pole collecting system of left kidney. Bilateral parapelvic cysts are noted. Simple left renal cyst is noted. No hydronephrosis or renal obstruction is noted. No ureteral calculi are noted. The appendix appears normal. There is no evidence of bowel obstruction. Diverticulosis of descending and sigmoid colon is noted without inflammation. Small fat containing periumbilical hernia is noted. Mild urinary bladder distention is noted. Moderate prostatic enlargement is noted. No significant adenopathy is noted. No abnormal fluid collection is noted. Atherosclerosis of abdominal aorta is noted without aneurysm formation. IMPRESSION:  Nonobstructive left renal calculus. No hydronephrosis or renal obstruction is noted. Mild fat containing periumbilical hernia is noted. Diverticulosis of descending and sigmoid colon is noted without inflammation. Stable moderate prostatic enlargement is noted. Mild urinary bladder distention is noted. Atherosclerosis of abdominal aorta is noted without aneurysm formation. Electronically Signed   By: JMarijo Conception M.D.   On: 06/04/2015 10:17     ASSESSMENT AND PLAN:  IgG kappa myeloma Multiple bone lesions seen on PET imaging RVD therapy Rash on revlimid, currently on every other day dosing Insomnia Hypocalcemia Diverticulitis/peridiverticular abscess seen on PET imaging, patient asymptomatic IR drain placement of diverticular abscess Sigmoid colon diverticulosis   I will see him back several weeks after his surgery with Dr. GJohney Maine We will then discuss restaging of his myeloma. Last studies indicated remission.  He will receive zometa today.  He needs a refill on his Xanax prescription.  All questions were answered. The patient knows to call the clinic with any problems, questions or concerns. We can certainly see the patient much sooner if  necessary.   This note is electronically signed.  This document serves as a record of services personally performed by Ancil Linsey, MD. It was created on her behalf by Kandace Blitz, a trained medical scribe. The creation of this record is based on the scribe's personal observations and the provider's statements to them. This document has been checked and approved by the attending provider.  I have reviewed the above documentation for accuracy and completeness, and I agree with the above.  Kelby Fam. Penland MD

## 2015-06-14 NOTE — Patient Instructions (Signed)
[facility] at [facility] Discharge Instructions  RECOMMENDATIONS MADE BY THE CONSULTANT AND ANY TEST RESULTS WILL BE SENT TO YOUR REFERRING PHYSICIAN.  Zometa today.    Thank you for choosing [facility] at [facility] to provide your oncology and hematology care.  To afford each patient quality time with our provider, please arrive at least 15 minutes before your scheduled appointment time.   Beginning January 23rd 2017 lab work for the Cancer Center will be done in the  Main lab at [facility] on 1st floor. If you have a lab appointment with the Cancer Center please come in thru the  Main Entrance and check in at the main information desk  You need to re-schedule your appointment should you arrive 10 or more minutes late.  We strive to give you quality time with our providers, and arriving late affects you and other patients whose appointments are after yours.  Also, if you no show three or more times for appointments you may be dismissed from the clinic at the providers discretion.     Again, thank you for choosing [facility].  Our hope is that these requests will decrease the amount of time that you wait before being seen by our physicians.       _____________________________________________________________  Should you have questions after your visit to [facility], please contact our office at [phone] between the hours of 8:30 a.m. and 4:30 p.m.  Voicemails left after 4:30 p.m. will not be returned until the following business day.  For prescription refill requests, have your pharmacy contact our office.         Resources For Cancer Patients and their Caregivers ? American Cancer Society: Can assist with transportation, wigs, general needs, runs Look Good Feel Better.        1-888-227-6333 ? Cancer Care: Provides financial assistance, online support groups, medication/co-pay assistance.  1-800-813-HOPE  (4673) ? [facility] Assists [place] cancer patients and their families through emotional , educational and financial support.  336-427-4357 ? [place] DSS Where to apply for food stamps, Medicaid and utility assistance. 336-342-1394 ? RCATS: Transportation to medical appointments. 336-347-2287 ? Social Security Administration: May apply for disability if have a Stage IV cancer. 336-342-7796 1-800-772-1213 ? [place] Aging, Disability and Transit Services: Assists with nutrition, care and transit needs. 336-349-2343  Cancer Center Support Programs: @10RELATIVEDAYS@ > Cancer Support Group  2nd Tuesday of the month 1pm-2pm, Journey Room  > Creative Journey  3rd Tuesday of the month 1130am-1pm, Journey Room  > Look Good Feel Better  1st Wednesday of the month 10am-12 noon, Journey Room (Call American [employer] to register 1-800-395-5775)    

## 2015-06-14 NOTE — Patient Instructions (Signed)
[place] at [facility] Discharge Instructions  RECOMMENDATIONS MADE BY THE CONSULTANT AND ANY TEST RESULTS WILL BE SENT TO YOUR REFERRING PHYSICIAN.  No Zometa in June Return to see Dr. Whitney Muse in June  Thank you for choosing [place] at [facility] to provide your oncology and hematology care.  To afford each patient quality time with our provider, please arrive at least 15 minutes before your scheduled appointment time.   Beginning January 23rd 2017 lab work for the [facility] will be done in the  Main lab at [facility] on 1st floor. If you have a lab appointment with the [facility] please come in thru the  Main Entrance and check in at the main information desk  You need to re-schedule your appointment should you arrive 10 or more minutes late.  We strive to give you quality time with our providers, and arriving late affects you and other patients whose appointments are after yours.  Also, if you no show three or more times for appointments you may be dismissed from the clinic at the providers discretion.     Again, thank you for choosing [facility].  Our hope is that these requests will decrease the amount of time that you wait before being seen by our physicians.       _____________________________________________________________  Should you have questions after your visit to [facility], please contact our office at [phone] between the hours of 8:30 a.m. and 4:30 p.m.  Voicemails left after 4:30 p.m. will not be returned until the following business day.  For prescription refill requests, have your pharmacy contact our office.         Resources For Cancer Patients and their Caregivers ? American Cancer Society: Can assist with transportation, wigs, general needs, runs Look Good Feel Better.        504 320 9451 ? Cancer Care: Provides financial assistance, online support groups,  medication/co-pay assistance.  1-800-813-HOPE (929)375-1352) ? [facility] Assists [place] cancer patients and their families through emotional , educational and financial support.  504-064-9337 ? [place] DSS Where to apply for food stamps, Medicaid and utility assistance. 424-521-1851 ? RCATS: Transportation to medical appointments. 432-423-8994 ? Social Security Administration: May apply for disability if have a Stage IV cancer. 272-192-2407 (438)760-2353 ? [employer], Disability and Transit Services: Assists with nutrition, care and transit needs. [place] Support Programs: @10RELATIVEDAYS @ > Cancer Support Group  2nd Tuesday of the month 1pm-2pm, Journey Room  > Creative Journey  3rd Tuesday of the month 1130am-1pm, Journey Room  > Look Good Feel Better  1st Wednesday of the month 10am-12 noon, Journey Room (Call [place] to register [phone])

## 2015-06-14 NOTE — Progress Notes (Signed)
Patient tolerated infusion well.  VSS.   

## 2015-07-04 NOTE — Patient Instructions (Addendum)
Dean [name].  07/04/2015   Your procedure is scheduled on: 07-11-15  Report to [facility] Main  Entrance take [facility]  elevators to 3rd floor to  [place] at 630 AM.  Call this number if you have problems the morning of surgery [phone]   Remember: ONLY 1 PERSON MAY GO WITH YOU TO SHORT STAY TO GET  READY MORNING OF [place].  Do not eat food  :After Midnight, clear liquids all day Tuesday 07-10-15 per dr gross, no clear liquids after midnight Tuesday night. Follow all bowel pre instructions     Take these medicines the morning of surgery with A SIP OF WATER:  GABAPENTIN (NEURONTIN), OXYCODONE IF NEEDED              You may not have any metal on your body including hair pins and              piercings  Do not wear jewelry, make-up, lotions, powders or perfumes, deodorant             Do not wear nail polish.  Do not shave  48 hours prior to surgery.              Men may shave face and neck.   Do not bring valuables to the hospital. [place].  Contacts, dentures or bridgework may not be worn into surgery.  Leave suitcase in the car. After surgery it may be brought to your room.                 Please read over the following fact sheets you were given: _____________________________________________________________________             [facility] - Preparing for Surgery Before surgery, you can play an important role.  Because skin is not sterile, your skin needs to be as free of germs as possible.  You can reduce the number of germs on your skin by washing with CHG (chlorahexidine gluconate) soap before surgery.  CHG is an antiseptic cleaner which kills germs and bonds with the skin to continue killing germs even after washing. Please DO NOT use if you have an allergy to CHG or antibacterial soaps.  If your skin becomes reddened/irritated stop using the CHG and inform your nurse when you arrive at  Short Stay. Do not shave (including legs and underarms) for at least 48 hours prior to the first CHG shower.  You may shave your face/neck. Please follow these instructions carefully:  1.  Shower with CHG Soap the night before surgery and the  morning of Surgery.  2.  If you choose to wash your hair, wash your hair first as usual with your  normal  shampoo.  3.  After you shampoo, rinse your hair and body thoroughly to remove the  shampoo.                           4.  Use CHG as you would any other liquid soap.  You can apply chg directly  to the skin and wash                       Gently with a scrungie or clean washcloth.  5.  Apply the CHG Soap to your body ONLY FROM THE NECK DOWN.   Do not use on face/ open                           Wound or open sores. Avoid contact with eyes, ears mouth and genitals (private parts).                       Wash face,  Genitals (private parts) with your normal soap.             6.  Wash thoroughly, paying special attention to the area where your surgery  will be performed.  7.  Thoroughly rinse your body with warm water from the neck down.  8.  DO NOT shower/wash with your normal soap after using and rinsing off  the CHG Soap.                9.  Pat yourself dry with a clean towel.            10.  Wear clean pajamas.            11.  Place clean sheets on your bed the night of your first shower and do not  sleep with pets. Day of Surgery : Do not apply any lotions/deodorants the morning of surgery.  Please wear clean clothes to the hospital/surgery center.  FAILURE TO FOLLOW THESE INSTRUCTIONS MAY RESULT IN THE CANCELLATION OF YOUR SURGERY PATIENT SIGNATURE_________________________________  NURSE SIGNATURE__________________________________  ________________________________________________________________________  WHAT IS A BLOOD TRANSFUSION? Blood Transfusion Information  A transfusion is the replacement of blood or some of its parts. Blood is made up  of multiple cells which provide different functions.  Red blood cells carry oxygen and are used for blood loss replacement.  White blood cells fight against infection.  Platelets control bleeding.  Plasma helps clot blood.  Other blood products are available for specialized needs, such as hemophilia or other clotting disorders. BEFORE THE TRANSFUSION  Who gives blood for transfusions?   Healthy volunteers who are fully evaluated to make sure their blood is safe. This is blood bank blood. Transfusion therapy is the safest it has ever been in the practice of medicine. Before blood is taken from a donor, a complete history is taken to make sure that person has no history of diseases nor engages in risky social behavior (examples are intravenous drug use or sexual activity with multiple partners). The donor's travel history is screened to minimize risk of transmitting infections, such as malaria. The donated blood is tested for signs of infectious diseases, such as HIV and hepatitis. The blood is then tested to be sure it is compatible with you in order to minimize the chance of a transfusion reaction. If you or a relative donates blood, this is often done in anticipation of surgery and is not appropriate for emergency situations. It takes many days to process the donated blood. RISKS AND COMPLICATIONS Although transfusion therapy is very safe and saves many lives, the main dangers of transfusion include:   Getting an infectious disease.  Developing a transfusion reaction. This is an allergic reaction to something in the blood you were given. Every precaution is taken to prevent this. The decision to have a blood transfusion has been considered carefully by your caregiver before blood is given. Blood is not given unless the benefits outweigh the risks. AFTER THE TRANSFUSION  Right after  receiving a blood transfusion, you will usually feel much better and more energetic. This is especially true  if your red blood cells have gotten low (anemic). The transfusion raises the level of the red blood cells which carry oxygen, and this usually causes an energy increase.  The nurse administering the transfusion will monitor you carefully for complications. HOME CARE INSTRUCTIONS  No special instructions are needed after a transfusion. You may find your energy is better. Speak with your caregiver about any limitations on activity for underlying diseases you may have. SEEK MEDICAL CARE IF:   Your condition is not improving after your transfusion.  You develop redness or irritation at the intravenous (IV) site. SEEK IMMEDIATE MEDICAL CARE IF:  Any of the following symptoms occur over the next 12 hours:  Shaking chills.  You have a temperature by mouth above 102 F (38.9 C), not controlled by medicine.  Chest, back, or muscle pain.  People around you feel you are not acting correctly or are confused.  Shortness of breath or difficulty breathing.  Dizziness and fainting.  You get a rash or develop hives.  You have a decrease in urine output.  Your urine turns a dark color or changes to pink, red, or brown. Any of the following symptoms occur over the next 10 days:  You have a temperature by mouth above 102 F (38.9 C), not controlled by medicine.  Shortness of breath.  Weakness after normal activity.  The white part of the eye turns yellow (jaundice).  You have a decrease in the amount of urine or are urinating less often.  Your urine turns a dark color or changes to pink, red, or brown. Document Released: 01/18/2000 Document Revised: 04/14/2011 Document Reviewed: 09/06/2007 [facility] Patient Information 2014 [place], [place].  _______________________________________________________________________

## 2015-07-06 ENCOUNTER — Encounter (HOSPITAL_COMMUNITY): Payer: Self-pay

## 2015-07-06 ENCOUNTER — Encounter (HOSPITAL_COMMUNITY)
Admission: RE | Admit: 2015-07-06 | Discharge: 2015-07-06 | Disposition: A | Discharge status: Home / Self Care | Payer: Medicare Other | Source: Ambulatory Visit | Attending: Surgery | Admitting: Surgery

## 2015-07-06 DIAGNOSIS — Z01812 Encounter for preprocedural laboratory examination: Secondary | ICD-10-CM | POA: Diagnosis not present

## 2015-07-06 DIAGNOSIS — Z79899 Other long term (current) drug therapy: Secondary | ICD-10-CM | POA: Insufficient documentation

## 2015-07-06 DIAGNOSIS — Z0183 Encounter for blood typing: Secondary | ICD-10-CM | POA: Diagnosis not present

## 2015-07-06 DIAGNOSIS — K5720 Diverticulitis of large intestine with perforation and abscess without bleeding: Secondary | ICD-10-CM | POA: Insufficient documentation

## 2015-07-06 DIAGNOSIS — Z01818 Encounter for other preprocedural examination: Secondary | ICD-10-CM | POA: Insufficient documentation

## 2015-07-06 DIAGNOSIS — C9000 Multiple myeloma not having achieved remission: Secondary | ICD-10-CM | POA: Diagnosis not present

## 2015-07-06 HISTORY — DX: Fracture of unspecified carpal bone, left wrist, initial encounter for closed fracture: S62.102A

## 2015-07-06 HISTORY — DX: Personal history of antineoplastic chemotherapy: Z92.21

## 2015-07-06 LAB — CBC
HCT: 42.7 % (ref 39.0–52.0)
Hemoglobin: 14 g/dL (ref 13.0–17.0)
MCH: 28.5 pg (ref 26.0–34.0)
MCHC: 32.8 g/dL (ref 30.0–36.0)
MCV: 86.8 fL (ref 78.0–100.0)
Platelets: 136 10*3/uL — ABNORMAL LOW (ref 150–400)
RBC: 4.92 MIL/uL (ref 4.22–5.81)
RDW: 14.2 % (ref 11.5–15.5)
WBC: 5 10*3/uL (ref 4.0–10.5)

## 2015-07-06 LAB — COMPREHENSIVE METABOLIC PANEL
ALT: 13 U/L — ABNORMAL LOW (ref 17–63)
AST: 16 U/L (ref 15–41)
Albumin: 3.9 g/dL (ref 3.5–5.0)
Alkaline Phosphatase: 18 U/L — ABNORMAL LOW (ref 38–126)
Anion gap: 5 (ref 5–15)
BUN: 16 mg/dL (ref 6–20)
CO2: 27 mmol/L (ref 22–32)
Calcium: 8.9 mg/dL (ref 8.9–10.3)
Chloride: 109 mmol/L (ref 101–111)
Creatinine, Ser: 0.63 mg/dL (ref 0.61–1.24)
GFR calc Af Amer: >60 mL/min (ref >60)
GFR calc non Af Amer: >60 mL/min (ref >60)
Glucose, Bld: 99 mg/dL (ref 65–99)
Potassium: 4.6 mmol/L (ref 3.5–5.1)
Sodium: 141 mmol/L (ref 135–145)
Total Bilirubin: 0.4 mg/dL (ref 0.3–1.2)
Total Protein: 6.5 g/dL (ref 6.5–8.1)

## 2015-07-06 LAB — ABO/RH: ABO/RH(D): B POS

## 2015-07-06 NOTE — Consult Note (Signed)
WOC ostomy consult note Pre-surgical marking for colectomy.  Will mark for Colostomy and Ileostomy today.  Patient is adamant that he does not want an ostomy and does not wish to be marked.  Wife is present and ostomy RN and wife provide emotional support and education regarding the need for a diversion as a lifesaving measure, if determined by the surgeon. Patient indicates that he just wants the intestines "stuck back together" no matter what.  Discussed the effects of an anastomosis of unhealthy bowel may lead to leakage and that a resting period is sometimes needed to improve the bowel health.  Discussed possibility of sepsis with a leak, etc.  Patient and wife agree that he can be marked today, but his desire is not to have an ostomy if possible.  Patient has a friend with a stoma that has experienced multiple hernias and complications and he is fearful of that outcome.    Patient is assessed sitting and standing.  Has had large fluctuations in weight and abdomen hangs pendulously below umbilicus.  Will mark above umbilicus for patient field of vision and for best pouching surface.   Patient is marked 4 cm left and 4 cm above umbilicus as well as 4 cm right and 4 cm above umbilicus, both within the rectus muscle. The area is cleansed with CHG and covered with transparent film.  Written materials and a sample pouch are provided.  WOC RN contact info is provided and patient and family are encouraged to contact me with further questions or concerns.  Both express understanding and are thankful for the education today.  Patient is more accepting that he can manage self care if he does need an ostomy.   [place] team will follow and will see patient postoperatively.  Domenic Moras RN BSN [place] Pager [phone]

## 2015-07-07 LAB — HEMOGLOBIN A1C
Hgb A1c MFr Bld: 5.6 % (ref 4.8–5.6)
Mean Plasma Glucose: 114 mg/dL

## 2015-07-10 MED ORDER — SODIUM CHLORIDE 0.9 % IV SOLN
INTRAVENOUS | Status: DC
  Filled 2015-07-10 (×2): qty 6

## 2015-07-10 NOTE — Anesthesia Preprocedure Evaluation (Addendum)
Anesthesia Evaluation  Patient identified by MRN, date of birth, ID band Patient awake    Reviewed: Allergy & Precautions, H&P , NPO status , Patient's Chart, lab work & pertinent test results  Airway Mallampati: II  TM Distance: >3 FB Neck ROM: full    Dental  (+) Dental Advisory Given, Missing Missing lower side teeth and a few upper:   Pulmonary neg pulmonary ROS,    Pulmonary exam normal breath sounds clear to auscultation       Cardiovascular Exercise Tolerance: Good negative cardio ROS Normal cardiovascular exam Rhythm:regular Rate:Normal     Neuro/Psych neuropathy negative neurological ROS  negative psych ROS   GI/Hepatic negative GI ROS, Neg liver ROS, GERD  Medicated and Controlled,  Endo/Other  negative endocrine ROS  Renal/GU negative Renal ROS  negative genitourinary   Musculoskeletal   Abdominal   Peds  Hematology negative hematology ROS (+) Platelets 136K   Anesthesia Other Findings Multiple myeloma without remission  Reproductive/Obstetrics negative OB ROS                            Anesthesia Physical Anesthesia Plan  ASA: III  Anesthesia Plan: General   Post-op Pain Management:    Induction: Intravenous  Airway Management Planned: Oral ETT  Additional Equipment:   Intra-op Plan:   Post-operative Plan: Extubation in OR  Informed Consent: I have reviewed the patients History and Physical, chart, labs and discussed the procedure including the risks, benefits and alternatives for the proposed anesthesia with the patient or authorized representative who has indicated his/her understanding and acceptance.   Dental Advisory Given  Plan Discussed with: CRNA and Surgeon  Anesthesia Plan Comments:         Anesthesia Quick Evaluation

## 2015-07-11 ENCOUNTER — Inpatient Hospital Stay (HOSPITAL_COMMUNITY)
Admission: RE | Admit: 2015-07-11 | Discharge: 2015-07-13 | DRG: 330 | Disposition: A | Discharge status: Home / Self Care | Payer: Medicare Other | Source: Ambulatory Visit | Attending: Surgery | Admitting: Surgery

## 2015-07-11 ENCOUNTER — Inpatient Hospital Stay (HOSPITAL_COMMUNITY): Payer: Medicare Other | Admitting: Anesthesiology

## 2015-07-11 ENCOUNTER — Encounter (HOSPITAL_COMMUNITY): Payer: Self-pay | Admitting: Certified Registered"

## 2015-07-11 ENCOUNTER — Encounter (HOSPITAL_COMMUNITY)
Admission: RE | Disposition: A | Discharge status: Home / Self Care | Payer: Self-pay | Source: Ambulatory Visit | Attending: Surgery

## 2015-07-11 DIAGNOSIS — K660 Peritoneal adhesions (postprocedural) (postinfection): Secondary | ICD-10-CM | POA: Diagnosis present

## 2015-07-11 DIAGNOSIS — Z8261 Family history of arthritis: Secondary | ICD-10-CM | POA: Diagnosis not present

## 2015-07-11 DIAGNOSIS — N400 Enlarged prostate without lower urinary tract symptoms: Secondary | ICD-10-CM | POA: Diagnosis present

## 2015-07-11 DIAGNOSIS — Z811 Family history of alcohol abuse and dependence: Secondary | ICD-10-CM | POA: Diagnosis not present

## 2015-07-11 DIAGNOSIS — K5732 Diverticulitis of large intestine without perforation or abscess without bleeding: Secondary | ICD-10-CM | POA: Diagnosis present

## 2015-07-11 DIAGNOSIS — Z79899 Other long term (current) drug therapy: Secondary | ICD-10-CM | POA: Diagnosis not present

## 2015-07-11 DIAGNOSIS — Z885 Allergy status to narcotic agent status: Secondary | ICD-10-CM | POA: Diagnosis not present

## 2015-07-11 DIAGNOSIS — Z823 Family history of stroke: Secondary | ICD-10-CM | POA: Diagnosis not present

## 2015-07-11 DIAGNOSIS — Z833 Family history of diabetes mellitus: Secondary | ICD-10-CM | POA: Diagnosis not present

## 2015-07-11 DIAGNOSIS — Z6836 Body mass index (BMI) 36.0-36.9, adult: Secondary | ICD-10-CM | POA: Diagnosis not present

## 2015-07-11 DIAGNOSIS — Z923 Personal history of irradiation: Secondary | ICD-10-CM | POA: Diagnosis not present

## 2015-07-11 DIAGNOSIS — F1011 Alcohol abuse, in remission: Secondary | ICD-10-CM | POA: Diagnosis present

## 2015-07-11 DIAGNOSIS — Z9221 Personal history of antineoplastic chemotherapy: Secondary | ICD-10-CM | POA: Diagnosis not present

## 2015-07-11 DIAGNOSIS — K5720 Diverticulitis of large intestine with perforation and abscess without bleeding: Secondary | ICD-10-CM | POA: Diagnosis not present

## 2015-07-11 DIAGNOSIS — F419 Anxiety disorder, unspecified: Secondary | ICD-10-CM | POA: Diagnosis present

## 2015-07-11 DIAGNOSIS — Z8249 Family history of ischemic heart disease and other diseases of the circulatory system: Secondary | ICD-10-CM | POA: Diagnosis not present

## 2015-07-11 DIAGNOSIS — G629 Polyneuropathy, unspecified: Secondary | ICD-10-CM | POA: Diagnosis present

## 2015-07-11 DIAGNOSIS — Z7982 Long term (current) use of aspirin: Secondary | ICD-10-CM | POA: Diagnosis not present

## 2015-07-11 DIAGNOSIS — K219 Gastro-esophageal reflux disease without esophagitis: Secondary | ICD-10-CM | POA: Diagnosis not present

## 2015-07-11 DIAGNOSIS — G4700 Insomnia, unspecified: Secondary | ICD-10-CM | POA: Diagnosis present

## 2015-07-11 DIAGNOSIS — E669 Obesity, unspecified: Secondary | ICD-10-CM | POA: Diagnosis present

## 2015-07-11 DIAGNOSIS — G8929 Other chronic pain: Secondary | ICD-10-CM | POA: Diagnosis present

## 2015-07-11 DIAGNOSIS — C9000 Multiple myeloma not having achieved remission: Secondary | ICD-10-CM | POA: Diagnosis present

## 2015-07-11 DIAGNOSIS — M1990 Unspecified osteoarthritis, unspecified site: Secondary | ICD-10-CM | POA: Diagnosis present

## 2015-07-11 DIAGNOSIS — R5383 Other fatigue: Secondary | ICD-10-CM | POA: Diagnosis not present

## 2015-07-11 HISTORY — DX: Diverticulitis of large intestine with perforation and abscess without bleeding: K57.20

## 2015-07-11 HISTORY — DX: Peritoneal abscess: K65.1

## 2015-07-11 SURGERY — COLECTOMY, PARTIAL, ROBOT-ASSISTED, LAPAROSCOPIC
Anesthesia: General | Site: Abdomen

## 2015-07-11 MED ORDER — ONDANSETRON HCL 4 MG/2ML IJ SOLN
INTRAMUSCULAR | Status: AC
  Filled 2015-07-11: qty 2

## 2015-07-11 MED ORDER — HYDROMORPHONE HCL 1 MG/ML IJ SOLN: 0.5000 mg | INTRAMUSCULAR | Status: DC | PRN

## 2015-07-11 MED ORDER — TAB-A-VITE/IRON PO TABS
1.0000 | ORAL_TABLET | Freq: Every day | ORAL | Status: DC
  Administered 2015-07-11 – 2015-07-13 (×3): 1 via ORAL
  Filled 2015-07-11 (×3): qty 1

## 2015-07-11 MED ORDER — MIDAZOLAM HCL 2 MG/2ML IJ SOLN
INTRAMUSCULAR | Status: AC
  Filled 2015-07-11: qty 2

## 2015-07-11 MED ORDER — LACTATED RINGERS IV SOLN
INTRAVENOUS | Status: DC
Start: 2015-07-11 — End: 2015-07-12
  Administered 2015-07-11: 15:00:00 via INTRAVENOUS

## 2015-07-11 MED ORDER — DIPHENHYDRAMINE HCL 25 MG PO CAPS: 25.0000 mg | ORAL_CAPSULE | ORAL | Status: DC | PRN

## 2015-07-11 MED ORDER — PROPOFOL 10 MG/ML IV BOLUS
INTRAVENOUS | Status: AC
  Filled 2015-07-11: qty 20

## 2015-07-11 MED ORDER — FENTANYL CITRATE (PF) 100 MCG/2ML IJ SOLN: 25.0000 ug | INTRAMUSCULAR | Status: DC | PRN

## 2015-07-11 MED ORDER — DEXTROSE 5 % IV SOLN
2.0000 g | INTRAVENOUS | Status: AC
  Administered 2015-07-11: 2 g via INTRAVENOUS

## 2015-07-11 MED ORDER — LIP MEDEX EX OINT
1.0000 "application " | TOPICAL_OINTMENT | Freq: Two times a day (BID) | CUTANEOUS | Status: DC
  Administered 2015-07-11 – 2015-07-13 (×4): 1 via TOPICAL
  Filled 2015-07-11 (×2): qty 7

## 2015-07-11 MED ORDER — 0.9 % SODIUM CHLORIDE (POUR BTL) OPTIME
TOPICAL | Status: DC | PRN
  Administered 2015-07-11: 2000 mL

## 2015-07-11 MED ORDER — ROCURONIUM BROMIDE 50 MG/5ML IV SOLN
INTRAVENOUS | Status: AC
  Filled 2015-07-11: qty 1

## 2015-07-11 MED ORDER — ZOLPIDEM TARTRATE 5 MG PO TABS: 5.0000 mg | ORAL_TABLET | Freq: Every evening | ORAL | Status: DC | PRN

## 2015-07-11 MED ORDER — LACTATED RINGERS IR SOLN
Status: DC | PRN
  Administered 2015-07-11: 1000 mL

## 2015-07-11 MED ORDER — ACETAMINOPHEN 10 MG/ML IV SOLN
INTRAVENOUS | Status: AC
  Filled 2015-07-11: qty 100

## 2015-07-11 MED ORDER — FENTANYL CITRATE (PF) 100 MCG/2ML IJ SOLN
INTRAMUSCULAR | Status: DC | PRN
  Administered 2015-07-11: 50 ug via INTRAVENOUS
  Administered 2015-07-11: 100 ug via INTRAVENOUS
  Administered 2015-07-11: 50 ug via INTRAVENOUS

## 2015-07-11 MED ORDER — ALUM & MAG HYDROXIDE-SIMETH 200-200-20 MG/5ML PO SUSP: 30.0000 mL | Freq: Four times a day (QID) | ORAL | Status: DC | PRN

## 2015-07-11 MED ORDER — ACETAMINOPHEN 500 MG PO TABS
1000.0000 mg | ORAL_TABLET | Freq: Three times a day (TID) | ORAL | Status: DC
  Administered 2015-07-11 – 2015-07-12 (×4): 1000 mg via ORAL
  Filled 2015-07-11 (×4): qty 2

## 2015-07-11 MED ORDER — BUPIVACAINE-EPINEPHRINE 0.25% -1:200000 IJ SOLN
INTRAMUSCULAR | Status: DC | PRN
  Administered 2015-07-11: 50 mL

## 2015-07-11 MED ORDER — CHLORHEXIDINE GLUCONATE 4 % EX LIQD: 60.0000 mL | Freq: Once | CUTANEOUS | Status: DC

## 2015-07-11 MED ORDER — GENTAMICIN SULFATE 40 MG/ML IJ SOLN
INTRAMUSCULAR | Status: DC | PRN
  Administered 2015-07-11: 12:00:00

## 2015-07-11 MED ORDER — PANTOPRAZOLE SODIUM 40 MG PO TBEC
40.0000 mg | DELAYED_RELEASE_TABLET | Freq: Every day | ORAL | Status: DC
  Administered 2015-07-11 – 2015-07-13 (×3): 40 mg via ORAL
  Filled 2015-07-11 (×3): qty 1

## 2015-07-11 MED ORDER — EPINEPHRINE 0.3 MG/0.3ML IJ SOAJ
0.3000 mg | Freq: Once | INTRAMUSCULAR | Status: DC | PRN
  Filled 2015-07-11: qty 0.3

## 2015-07-11 MED ORDER — MENTHOL 3 MG MT LOZG: 1.0000 | LOZENGE | OROMUCOSAL | Status: DC | PRN

## 2015-07-11 MED ORDER — PHENYLEPHRINE 40 MCG/ML (10ML) SYRINGE FOR IV PUSH (FOR BLOOD PRESSURE SUPPORT)
PREFILLED_SYRINGE | INTRAVENOUS | Status: AC
  Filled 2015-07-11: qty 10

## 2015-07-11 MED ORDER — PHENYLEPHRINE 40 MCG/ML (10ML) SYRINGE FOR IV PUSH (FOR BLOOD PRESSURE SUPPORT)
PREFILLED_SYRINGE | INTRAVENOUS | Status: DC | PRN
  Administered 2015-07-11 (×2): 80 ug via INTRAVENOUS

## 2015-07-11 MED ORDER — LACTATED RINGERS IV SOLN
INTRAVENOUS | Status: DC | PRN
Start: 2015-07-11 — End: 2015-07-11
  Administered 2015-07-11 (×2): via INTRAVENOUS

## 2015-07-11 MED ORDER — MAGIC MOUTHWASH
15.0000 mL | Freq: Four times a day (QID) | ORAL | Status: DC | PRN
  Filled 2015-07-11: qty 15

## 2015-07-11 MED ORDER — BUPIVACAINE LIPOSOME 1.3 % IJ SUSP
20.0000 mL | INTRAMUSCULAR | Status: DC
  Filled 2015-07-11: qty 20

## 2015-07-11 MED ORDER — FENTANYL CITRATE (PF) 250 MCG/5ML IJ SOLN
INTRAMUSCULAR | Status: AC
  Filled 2015-07-11: qty 5

## 2015-07-11 MED ORDER — CEFOTETAN DISODIUM-DEXTROSE 2-2.08 GM-% IV SOLR
INTRAVENOUS | Status: AC
  Filled 2015-07-11: qty 50

## 2015-07-11 MED ORDER — SACCHAROMYCES BOULARDII 250 MG PO CAPS
250.0000 mg | ORAL_CAPSULE | Freq: Two times a day (BID) | ORAL | Status: DC
  Administered 2015-07-11 – 2015-07-13 (×5): 250 mg via ORAL
  Filled 2015-07-11 (×5): qty 1

## 2015-07-11 MED ORDER — GABAPENTIN 400 MG PO CAPS
400.0000 mg | ORAL_CAPSULE | Freq: Four times a day (QID) | ORAL | Status: DC
  Administered 2015-07-11 – 2015-07-13 (×8): 400 mg via ORAL
  Filled 2015-07-11 (×8): qty 1

## 2015-07-11 MED ORDER — DIPHENHYDRAMINE HCL 12.5 MG/5ML PO ELIX: 12.5000 mg | ORAL_SOLUTION | Freq: Four times a day (QID) | ORAL | Status: DC | PRN

## 2015-07-11 MED ORDER — ALVIMOPAN 12 MG PO CAPS
12.0000 mg | ORAL_CAPSULE | Freq: Two times a day (BID) | ORAL | Status: DC
  Filled 2015-07-11 (×2): qty 1

## 2015-07-11 MED ORDER — OXYCODONE HCL 5 MG PO TABS: 5.0000 mg | ORAL_TABLET | Freq: Four times a day (QID) | ORAL | Status: DC | PRN

## 2015-07-11 MED ORDER — PROPOFOL 10 MG/ML IV BOLUS
INTRAVENOUS | Status: DC | PRN
  Administered 2015-07-11: 150 mg via INTRAVENOUS

## 2015-07-11 MED ORDER — FAMOTIDINE 20 MG PO TABS
20.0000 mg | ORAL_TABLET | Freq: Two times a day (BID) | ORAL | Status: DC
  Administered 2015-07-11 – 2015-07-13 (×4): 20 mg via ORAL
  Filled 2015-07-11 (×5): qty 1

## 2015-07-11 MED ORDER — HYDROMORPHONE HCL 2 MG/ML IJ SOLN
INTRAMUSCULAR | Status: AC
  Filled 2015-07-11: qty 1

## 2015-07-11 MED ORDER — CALCIUM CARBONATE-VITAMIN D 500-200 MG-UNIT PO TABS
1.0000 | ORAL_TABLET | Freq: Two times a day (BID) | ORAL | Status: DC
  Administered 2015-07-11 – 2015-07-13 (×5): 1 via ORAL
  Filled 2015-07-11 (×9): qty 1

## 2015-07-11 MED ORDER — ALPRAZOLAM 1 MG PO TABS
1.0000 mg | ORAL_TABLET | Freq: Every evening | ORAL | Status: DC | PRN
  Administered 2015-07-11 – 2015-07-12 (×2): 1 mg via ORAL
  Filled 2015-07-11 (×2): qty 1

## 2015-07-11 MED ORDER — ENOXAPARIN SODIUM 40 MG/0.4ML ~~LOC~~ SOLN
40.0000 mg | SUBCUTANEOUS | Status: DC
  Administered 2015-07-12: 40 mg via SUBCUTANEOUS
  Filled 2015-07-11: qty 0.4

## 2015-07-11 MED ORDER — PHENOL 1.4 % MT LIQD
2.0000 | OROMUCOSAL | Status: DC | PRN
  Filled 2015-07-11: qty 177

## 2015-07-11 MED ORDER — METHOCARBAMOL 1000 MG/10ML IJ SOLN
1000.0000 mg | Freq: Four times a day (QID) | INTRAVENOUS | Status: DC | PRN
  Filled 2015-07-11: qty 10

## 2015-07-11 MED ORDER — HYDROMORPHONE HCL 1 MG/ML IJ SOLN
INTRAMUSCULAR | Status: DC | PRN
  Administered 2015-07-11 (×2): 1 mg via INTRAVENOUS

## 2015-07-11 MED ORDER — MIDAZOLAM HCL 5 MG/5ML IJ SOLN
INTRAMUSCULAR | Status: DC | PRN
  Administered 2015-07-11: 2 mg via INTRAVENOUS

## 2015-07-11 MED ORDER — LACTATED RINGERS IV BOLUS (SEPSIS)
1000.0000 mL | Freq: Three times a day (TID) | INTRAVENOUS | Status: DC | PRN
  Filled 2015-07-11: qty 1000

## 2015-07-11 MED ORDER — SUGAMMADEX SODIUM 200 MG/2ML IV SOLN
INTRAVENOUS | Status: DC | PRN
  Administered 2015-07-11: 200 mg via INTRAVENOUS

## 2015-07-11 MED ORDER — DIPHENHYDRAMINE HCL 50 MG/ML IJ SOLN: 12.5000 mg | Freq: Four times a day (QID) | INTRAMUSCULAR | Status: DC | PRN

## 2015-07-11 MED ORDER — ENOXAPARIN SODIUM 40 MG/0.4ML ~~LOC~~ SOLN
40.0000 mg | Freq: Once | SUBCUTANEOUS | Status: AC
  Administered 2015-07-11: 40 mg via SUBCUTANEOUS
  Filled 2015-07-11: qty 0.4

## 2015-07-11 MED ORDER — PROCHLORPERAZINE EDISYLATE 5 MG/ML IJ SOLN: 10.0000 mg | Freq: Four times a day (QID) | INTRAMUSCULAR | Status: DC | PRN

## 2015-07-11 MED ORDER — SUGAMMADEX SODIUM 200 MG/2ML IV SOLN
INTRAVENOUS | Status: AC
  Filled 2015-07-11: qty 2

## 2015-07-11 MED ORDER — ASPIRIN EC 325 MG PO TBEC
325.0000 mg | DELAYED_RELEASE_TABLET | Freq: Every day | ORAL | Status: DC
  Administered 2015-07-12 – 2015-07-13 (×2): 325 mg via ORAL
  Filled 2015-07-11 (×3): qty 1

## 2015-07-11 MED ORDER — ZOLEDRONIC ACID 4 MG/5ML IV CONC: 4.0000 mg | INTRAVENOUS | Status: DC

## 2015-07-11 MED ORDER — DEXAMETHASONE SODIUM PHOSPHATE 10 MG/ML IJ SOLN
INTRAMUSCULAR | Status: AC
  Filled 2015-07-11: qty 1

## 2015-07-11 MED ORDER — LIDOCAINE HCL (CARDIAC) 20 MG/ML IV SOLN
INTRAVENOUS | Status: AC
  Filled 2015-07-11: qty 5

## 2015-07-11 MED ORDER — METOPROLOL TARTRATE 5 MG/5ML IV SOLN: 5.0000 mg | Freq: Four times a day (QID) | INTRAVENOUS | Status: DC | PRN

## 2015-07-11 MED ORDER — DEXTROSE 5 % IV SOLN
2.0000 g | Freq: Two times a day (BID) | INTRAVENOUS | Status: AC
  Administered 2015-07-11: 2 g via INTRAVENOUS
  Filled 2015-07-11: qty 2

## 2015-07-11 MED ORDER — TAMSULOSIN HCL 0.4 MG PO CAPS
0.4000 mg | ORAL_CAPSULE | Freq: Every day | ORAL | Status: DC
  Administered 2015-07-11 – 2015-07-12 (×2): 0.4 mg via ORAL
  Filled 2015-07-11 (×2): qty 1

## 2015-07-11 MED ORDER — ONDANSETRON HCL 4 MG/2ML IJ SOLN
INTRAMUSCULAR | Status: DC | PRN
  Administered 2015-07-11: 4 mg via INTRAVENOUS

## 2015-07-11 MED ORDER — LACTATED RINGERS IV SOLN: INTRAVENOUS | Status: DC

## 2015-07-11 MED ORDER — LIDOCAINE HCL (PF) 2 % IJ SOLN
INTRAMUSCULAR | Status: DC | PRN
  Administered 2015-07-11: 30 mg via INTRADERMAL

## 2015-07-11 MED ORDER — DEXAMETHASONE SODIUM PHOSPHATE 10 MG/ML IJ SOLN
INTRAMUSCULAR | Status: DC | PRN
  Administered 2015-07-11: 10 mg via INTRAVENOUS

## 2015-07-11 MED ORDER — ROCURONIUM BROMIDE 100 MG/10ML IV SOLN
INTRAVENOUS | Status: DC | PRN
  Administered 2015-07-11 (×3): 10 mg via INTRAVENOUS
  Administered 2015-07-11: 60 mg via INTRAVENOUS

## 2015-07-11 MED ORDER — BUPIVACAINE LIPOSOME 1.3 % IJ SUSP
INTRAMUSCULAR | Status: DC | PRN
  Administered 2015-07-11: 20 mL

## 2015-07-11 MED ORDER — METHOCARBAMOL 750 MG PO TABS: 750.0000 mg | ORAL_TABLET | Freq: Four times a day (QID) | ORAL | Status: DC | PRN

## 2015-07-11 MED ORDER — BUPIVACAINE-EPINEPHRINE 0.25% -1:200000 IJ SOLN
INTRAMUSCULAR | Status: AC
  Filled 2015-07-11: qty 1

## 2015-07-11 MED ORDER — ALVIMOPAN 12 MG PO CAPS
12.0000 mg | ORAL_CAPSULE | Freq: Once | ORAL | Status: AC
  Administered 2015-07-11: 12 mg via ORAL
  Filled 2015-07-11: qty 1

## 2015-07-11 MED ORDER — ACETAMINOPHEN 10 MG/ML IV SOLN
INTRAVENOUS | Status: DC | PRN
  Administered 2015-07-11: 1000 mg via INTRAVENOUS

## 2015-07-11 SURGICAL SUPPLY — 100 items
APPLIER CLIP 5 13 M/L LIGAMAX5 (MISCELLANEOUS)
APPLIER CLIP ROT 10 11.4 M/L (STAPLE)
BLADE EXTENDED COATED 6.5IN (ELECTRODE) IMPLANT
BLADE SURG SZ11 CARB STEEL (BLADE) ×3 IMPLANT
CANNULA REDUC XI 12-8 STAPL (CANNULA) ×1
CANNULA REDUC XI 12-8MM STAPL (CANNULA) ×1
CANNULA REDUCER 12-8 DVNC XI (CANNULA) ×1 IMPLANT
CELLS DAT CNTRL 66122 CELL SVR (MISCELLANEOUS) IMPLANT
CHLORAPREP W/TINT 26ML (MISCELLANEOUS) ×3 IMPLANT
CLIP APPLIE 5 13 M/L LIGAMAX5 (MISCELLANEOUS) IMPLANT
CLIP APPLIE ROT 10 11.4 M/L (STAPLE) IMPLANT
CLIP LIGATING HEM O LOK PURPLE (MISCELLANEOUS) IMPLANT
CLIP LIGATING HEMO O LOK GREEN (MISCELLANEOUS) IMPLANT
COUNTER NEEDLE 20 DBL MAG RED (NEEDLE) ×3 IMPLANT
COVER MAYO STAND STRL (DRAPES) ×6 IMPLANT
COVER TIP SHEARS 8 DVNC (MISCELLANEOUS) ×1 IMPLANT
COVER TIP SHEARS 8MM DA VINCI (MISCELLANEOUS) ×2
DECANTER SPIKE VIAL GLASS SM (MISCELLANEOUS) ×6 IMPLANT
DEVICE TROCAR PUNCTURE CLOSURE (ENDOMECHANICALS) IMPLANT
DRAIN CHANNEL 19F RND (DRAIN) IMPLANT
DRAPE ARM DVNC X/XI (DISPOSABLE) ×4 IMPLANT
DRAPE COLUMN DVNC XI (DISPOSABLE) ×1 IMPLANT
DRAPE DA VINCI XI ARM (DISPOSABLE) ×8
DRAPE DA VINCI XI COLUMN (DISPOSABLE) ×2
DRAPE SURG IRRIG POUCH 19X23 (DRAPES) ×3 IMPLANT
DRSG OPSITE POSTOP 4X10 (GAUZE/BANDAGES/DRESSINGS) IMPLANT
DRSG OPSITE POSTOP 4X6 (GAUZE/BANDAGES/DRESSINGS) ×3 IMPLANT
DRSG OPSITE POSTOP 4X8 (GAUZE/BANDAGES/DRESSINGS) IMPLANT
DRSG TEGADERM 2-3/8X2-3/4 SM (GAUZE/BANDAGES/DRESSINGS) ×12 IMPLANT
DRSG TEGADERM 4X4.75 (GAUZE/BANDAGES/DRESSINGS) IMPLANT
ELECT PENCIL ROCKER SW 15FT (MISCELLANEOUS) ×6 IMPLANT
ELECT REM PT RETURN 15FT ADLT (MISCELLANEOUS) ×3 IMPLANT
ENDOLOOP SUT PDS II  0 18 (SUTURE)
ENDOLOOP SUT PDS II 0 18 (SUTURE) IMPLANT
EVACUATOR SILICONE 100CC (DRAIN) IMPLANT
GAUZE SPONGE 2X2 8PLY STRL LF (GAUZE/BANDAGES/DRESSINGS) ×2 IMPLANT
GAUZE SPONGE 4X4 12PLY STRL (GAUZE/BANDAGES/DRESSINGS) IMPLANT
GLOVE ECLIPSE 8.0 STRL XLNG CF (GLOVE) ×9 IMPLANT
GLOVE INDICATOR 8.0 STRL GRN (GLOVE) ×9 IMPLANT
GOWN STRL REUS W/TWL XL LVL3 (GOWN DISPOSABLE) ×12 IMPLANT
HOLDER FOLEY CATH W/STRAP (MISCELLANEOUS) ×3 IMPLANT
KIT PROCEDURE DA VINCI SI (MISCELLANEOUS) ×2
KIT PROCEDURE DVNC SI (MISCELLANEOUS) ×1 IMPLANT
LEGGING LITHOTOMY PAIR STRL (DRAPES) ×3 IMPLANT
LUBRICANT JELLY K Y 4OZ (MISCELLANEOUS) IMPLANT
NEEDLE INSUFFLATION 14GA 120MM (NEEDLE) ×3 IMPLANT
PACK CARDIOVASCULAR III (CUSTOM PROCEDURE TRAY) ×3 IMPLANT
PACK COLON (CUSTOM PROCEDURE TRAY) ×3 IMPLANT
PAD POSITIONING PINK XL (MISCELLANEOUS) ×3 IMPLANT
PORT LAP GEL ALEXIS MED 5-9CM (MISCELLANEOUS) IMPLANT
RTRCTR WOUND ALEXIS 18CM MED (MISCELLANEOUS)
SCISSORS LAP 5X35 DISP (ENDOMECHANICALS) IMPLANT
SEAL CANN UNIV 5-8 DVNC XI (MISCELLANEOUS) ×3 IMPLANT
SEAL XI 5MM-8MM UNIVERSAL (MISCELLANEOUS) ×6
SEALER VESSEL DA VINCI XI (MISCELLANEOUS)
SEALER VESSEL EXT DVNC XI (MISCELLANEOUS) IMPLANT
SET BI-LUMEN FLTR TB AIRSEAL (TUBING) ×3 IMPLANT
SET TUBE IRRIG SUCTION NO TIP (IRRIGATION / IRRIGATOR) ×3 IMPLANT
SLEEVE ADV FIXATION 5X100MM (TROCAR) IMPLANT
SOLUTION ELECTROLUBE (MISCELLANEOUS) ×3 IMPLANT
SPONGE GAUZE 2X2 STER 10/PKG (GAUZE/BANDAGES/DRESSINGS) ×4
STAPLER 45 BLU RELOAD XI (STAPLE) IMPLANT
STAPLER 45 BLUE RELOAD XI (STAPLE)
STAPLER 45 GREEN RELOAD XI (STAPLE) ×2
STAPLER 45 GRN RELOAD XI (STAPLE) ×1 IMPLANT
STAPLER CANNULA SEAL DVNC XI (STAPLE) ×1 IMPLANT
STAPLER CANNULA SEAL XI (STAPLE) ×2
STAPLER CIRC ILS CVD 33MM 37CM (STAPLE) ×3 IMPLANT
STAPLER SHEATH (SHEATH) ×2
STAPLER SHEATH ENDOWRIST DVNC (SHEATH) ×1 IMPLANT
SUT MNCRL AB 4-0 PS2 18 (SUTURE) ×3 IMPLANT
SUT PDS AB 1 CTX 36 (SUTURE) ×6 IMPLANT
SUT PDS AB 1 TP1 96 (SUTURE) IMPLANT
SUT PDS AB 2-0 CT2 27 (SUTURE) IMPLANT
SUT PROLENE 0 CT 2 (SUTURE) ×3 IMPLANT
SUT PROLENE 2 0 SH DA (SUTURE) IMPLANT
SUT SILK 2 0 (SUTURE) ×2
SUT SILK 2 0 SH CR/8 (SUTURE) ×3 IMPLANT
SUT SILK 2-0 18XBRD TIE 12 (SUTURE) ×1 IMPLANT
SUT SILK 3 0 (SUTURE) ×2
SUT SILK 3 0 SH CR/8 (SUTURE) ×3 IMPLANT
SUT SILK 3-0 18XBRD TIE 12 (SUTURE) ×1 IMPLANT
SUT V-LOC BARB 180 2/0GR6 GS22 (SUTURE)
SUT VIC AB 3-0 SH 18 (SUTURE) IMPLANT
SUT VIC AB 3-0 SH 27 (SUTURE)
SUT VIC AB 3-0 SH 27XBRD (SUTURE) IMPLANT
SUT VICRYL 0 UR6 27IN ABS (SUTURE) ×3 IMPLANT
SUT VLOC 180 2-0 9IN GS21 (SUTURE) IMPLANT
SUTURE V-LC BRB 180 2/0GR6GS22 (SUTURE) IMPLANT
SYRINGE 10CC LL (SYRINGE) ×3 IMPLANT
SYS LAPSCP GELPORT 120MM (MISCELLANEOUS)
SYSTEM LAPSCP GELPORT 120MM (MISCELLANEOUS) IMPLANT
TAPE UMBILICAL COTTON 1/8X30 (MISCELLANEOUS) ×3 IMPLANT
TOWEL OR 17X26 10 PK STRL BLUE (TOWEL DISPOSABLE) ×3 IMPLANT
TOWEL OR NON WOVEN STRL DISP B (DISPOSABLE) IMPLANT
TRAY FOLEY W/METER SILVER 14FR (SET/KITS/TRAYS/PACK) IMPLANT
TRAY FOLEY W/METER SILVER 16FR (SET/KITS/TRAYS/PACK) ×3 IMPLANT
TROCAR ADV FIXATION 5X100MM (TROCAR) IMPLANT
TUBING CONNECTING 10 (TUBING) IMPLANT
TUBING CONNECTING 10' (TUBING)

## 2015-07-11 NOTE — Anesthesia Postprocedure Evaluation (Signed)
Anesthesia Post Note  Patient: Dean [name].  Procedure(s) Performed: Procedure(s) (LRB): XI ROBOT ASSISTED LAPAROSCOPIC SIGMOID COLECTOMY RIGID PROCTOSCOPY AND LYSIS OF ADHESIONS  (N/A)  Patient location during evaluation: PACU Anesthesia Type: General Level of consciousness: awake and alert Pain management: pain level controlled Vital Signs Assessment: post-procedure vital signs reviewed and stable Respiratory status: spontaneous breathing, nonlabored ventilation, respiratory function stable and patient connected to nasal cannula oxygen Cardiovascular status: blood pressure returned to baseline and stable Postop Assessment: no signs of nausea or vomiting Anesthetic complications: no    Last Vitals:  Filed Vitals:   07/11/15 1238 07/11/15 1255  BP: 116/55 140/68  Pulse:  77  Temp: 36.3 C 36.3 C  Resp: 14 16    Last Pain:  Filed Vitals:   07/11/15 1304  PainSc: 0-No pain                 Anaya Bovee L

## 2015-07-11 NOTE — Op Note (Signed)
07/11/2015  11:50 AM  PATIENT:  Dean [name]  80 y.o. male  Patient Care Team: [name], MD as PCP - General (Family Medicine) Gery Pray, MD as Consulting Physician (Radiation Oncology) [name], MD as Consulting Physician (Hematology and Oncology) Danie Binder, MD as Consulting Physician (Gastroenterology) Michael Boston, MD as Consulting Physician (General Surgery)  PRE-OPERATIVE DIAGNOSIS:  Sigmoid diverticulitis with prior abcess   POST-OPERATIVE DIAGNOSIS:  Sigmoid diverticulitis with prior abcess   PROCEDURE:   XI ROBOT ASSISTED SIGMOID COLECTOMY ROBOTIC LYSIS OF ADHESIONS  RIGID PROCTOSCOPY  SURGEON:  Michael Boston, MD  ASSISTANT: Leighton Ruff, MD   ANESTHESIA:   local and general  EBL:  Total I/O In: 1500 [I.V.:1500] Out: 300 [Urine:250; Blood:50]  Delay start of Pharmacological VTE agent (>24hrs) due to surgical blood loss or risk of bleeding:  no  DRAINS: none   SPECIMEN:  Source of Specimen:  SIGMOID COLON with distal anastomotic ring as final margin  DISPOSITION OF SPECIMEN:  PATHOLOGY  COUNTS:  YES  PLAN OF CARE: Admit to inpatient   PATIENT DISPOSITION:  PACU - hemodynamically stable.  INDICATION:    Patient found to have have pain in the winter 2016.  Found evidence of diverticulitis.  Worsened.  Prior to admission with IV antibiotics.  Required percutaneous drainage of an abscess.  Any had colonoscopy in March that showed diverticulosis and no tumor.  He has had some occasional pain as well.  He is also diagnosed with multiple myeloma.  Chemotherapy recommended.  Hesitant to have that treated until diverticulitis addressed.  Patient terrified of the idea of a colostomy.  Long discussion with the patient.  I recommended segmental resection.  I offered minimally invasive approach with attempted primary anastomosis.  I noted risks of emergency surgery she had another attack happened far higher risk of posterior for higher.  He  agreed with surgery:  The anatomy & physiology of the digestive tract was discussed.  The pathophysiology was discussed.  Natural history risks without surgery was discussed.   I worked to give an overview of the disease and the frequent need to have multispecialty involvement.  I feel the risks of no intervention will lead to serious problems that outweigh the operative risks; therefore, I recommended a partial colectomy to remove the pathology.  Laparoscopic & open techniques were discussed.   Risks such as bleeding, infection, abscess, leak, reoperation, possible ostomy, hernia, heart attack, death, and other risks were discussed.  I noted a good likelihood this will help address the problem.   Goals of post-operative recovery were discussed as well.  We will work to minimize complications.  Educational materials on the pathology had been given in the office.  Questions were answered.    The patient expressed understanding & wished to proceed with surgery.  OR FINDINGS:   Patient had stretch at sigmoid colon.  Very short mesentery so quite curled upon itself.  Most of sigmoid colon inflamed and thickened.  Omental adhesions.  Patient had descending/sigmoid junction.  Rectum normal.  No obvious metastatic disease on visceral parietal peritoneum or liver.  The anastomosis rests 16 cm from the anal verge by rigid proctoscopy.  DESCRIPTION:   Informed consent was confirmed.  The patient underwent general anaesthesia without difficulty.  The patient was positioned appropriately.  VTE prevention in place.  The patient's abdomen was clipped, prepped, & draped in a sterile fashion.  Surgical timeout confirmed our plan.  The patient was positioned in reverse  Trendelenburg.  Abdominal entry was gained using Varess technique with a trach hook on the anterior abdominal wall fascia in the right upper abdomen.  Entry was clean.  I induced carbon dioxide insufflation.  Camera inspection revealed no injury.   Extra ports were carefully placed under direct laparoscopic visualization.    I reflected the right side of the greater omentum & the small bowel in the upper abdomen.  The patient was carefully positioned.  The Intuitive daVinci robot was carefully docked with camera & instruments carefully placed.  The patient had some omental and colonic adhesions to the anterior abdominal wall and sidewall consistent with prior diverticulitis.  I did robotic lysis adhesions to free greater omentum off the pelvis and left lower quadrant.  Also freed adhesions of sigmoid colon off the anterior abdominal wall and pelvic sidewall.I scored the base of peritoneum of the medial side of the mesentery of the left colon from the ligament of Treitz to the peritoneal reflection of the mid rectum.   I elevated the sigmoid mesentery and entered into the retro-mesenteric plane. We were able to identify the left ureter and gonadal vessels. We kept those posterior within the retroperitoneum and elevated the left colon mesentery off that. I did isolate the inferior mesenteric artery (IMA) pedicle but did not ligate it yet.  I continued distally and got into the avascular plane posterior to the mesorectum. This allowed me to help mobilize the rectum as well by freeing the mesorectum off the sacrum.  I mobilized the peritoneal coverings towards the peritoneal reflection on both the right and left sides of the rectum.  I stayed away from the right and left ureters.  I kept the lateral vascular pedicles to the rectum intact.  I skeletonized the lymph nodes off the inferior mesenteric artery pedicle.  I went down to its takeoff from the aorta.    I isolated the inferior mesenteric vein off of the ligament of Treitz just cephalad to that as well.  After confirming the left ureter was out of the way, I went ahead and ligated the inferior mesenteric artery pedicle just near its takeoff from the aorta. Initially took just the superior hemorrhoidal  artery but the left colic was foreshortened limiting reach of the left colon down into the pelvis. We ensured hemostasis. I skeletonized the mesorectum at the junction at the proximal rectum for the distal point of resection.  I mobilized the left colon in a lateral to medial fashion off the line of Toldt up towards the splenic flexure to ensure good mobilization of the remaining left colon to reach into the pelvis.  I had enough reach of the mid descending colon down the pelvis, so I did not like transect the inferior mesenteric vein at its takeoff above the ligament of Treitz.  I skeletonized and transversely resected the proximal mesorectum distal to the sacral promontory and transected at the proximal rectum using a "green" load robotic 45 mm stapler given the thickened tissue and his obesity and android central obesity.  Just one firing needed.  I chose a region at the descending/sigmoid junction that was soft and easily reached down to the rectal stump.  I transected the mesentery of the colon radially to preserve remaining colon blood supply of the proximal and mid descending colon..  I placed a wound protector through the right paramedian 12 mm port site after expanding it in a Pfannenstiel fashion through the right rectus and deeper transverse fascial layers.  I did have  to make it larger at 6 cm given the significant thickening of his torturous sigmoid colon.  Eventually, I was able to eviscerate the rectosigmoid and descending colon out the wound.   I clamped the colon proximal to this area using a soft bowel clamp. I transected at the descending/sigmoid junction with a scalpel. I got healthy bleeding mucosa.  We sent the rectosigmoid colon specimen off to go to pathology.  We sized the colon orifice.  I chose a 33 EEA anvil stapler system. I placed the anvil to the open end of the proximal remaining colon and closed around it using a 0 Prolene pursestring.  We did copious irrigation with crystalloid  solution.  Hemostasis was good.  The distal end of the remaining colon easily reached down to the rectal stump, therefore, splenic flexure mobilization was not needed.      Dr Marcello Moores scrubbed down and did gentle anal dilation and advanced the EEA stapler up the rectal stump. The spike was brought out at the provimal end of the rectal stump under direct visualization.  I attached the anvil of the proximal colon the spike of the stapler. Anvil was tightened down and held clamped for 60 seconds. The EEA stapler was fired and held clamped for 30 seconds. The stapler was released & removed. We noted 2 excellent anastomotic rings. Blue stitch is in the proximal ring.  Dr Marcello Moores did rigid proctoscopy noted the anastomosis was at 16 cm from the anal verge consistent with the proximal rectum.  We did a final irrigation of antibiotic solution (900 mg clindamycin/240 mg gentamicin in a liter of crystalloid) & held that for the pelvic air leak test .  The rectum was insufflated the rectum while clamping the colon proximal to that anastomosis.  There was a negative air leak test. There was no tension of mesentery or bowel at the anastomosis.   Tissues looked viable.  Ureters & bowel uninjured.  The anastomosis looked healthy.  Endoluminal gas was evacuated.  Ports & wound protector removed.  We changed gloves.  We aspirated the antibiotic irrigation.  Hemostasis was good.  Sterile unused instruments were used from this point out per colon SSI prevention protocol.  I closed the 59m port sites using Monocryl stitch and sterile dressing.  I closed the right paramedian extraction wound using a 0 Vicryl vertical retrorectus and peritoneal peritoneal closure and a #1 PDS transverse anterior rectal fascial closure. I closed the skin with some interrupted Monocryl stitches. I placed antibiotic-soaked wicks into the closure at the corners x2 total. I placed a sterile dressing.    Patient is being extubated go to recovery room. I  had discussed postop care with the patient in detail the office & in the holding area with the patient again while his daughter, MStanton Kidney was at his side.. Instructions are written.  I'm about to locate his daughter, MStanton Kidney and discussed with her as well.    SAdin Hector M.D., F.A.C.S. [facility]astrointestinal [facility]ally [facility] [address] [place] [phone] / Paging

## 2015-07-11 NOTE — Interval H&P Note (Signed)
History and Physical Interval Note:  07/11/2015 8:23 AM  Dean [name].  has presented today for surgery, with the diagnosis of Sigmoid diverticulitis with prior abcess   The various methods of treatment have been discussed with the patient and family. After consideration of risks, benefits and other options for treatment, the patient has consented to  Procedure(s): XI ROBOT ASSISTED LAPAROSCOPIC SIGMOID COLECTOMY RIGID PROCTOSCOPY  (N/A) as a surgical intervention .  The patient's history has been reviewed, patient examined, no change in status, stable for surgery.  I have reviewed the patient's chart and labs.  Questions were answered to the patient's satisfaction.     [name] C.

## 2015-07-11 NOTE — Interval H&P Note (Signed)
History and Physical Interval Note:  07/11/2015 8:23 AM  Dean [name].  has presented today for surgery, with the diagnosis of Sigmoid diverticulitis with prior abcess   The various methods of treatment have been discussed with the patient and family. After consideration of risks, benefits and other options for treatment, the patient has consented to  Procedure(s): XI ROBOT ASSISTED LAPAROSCOPIC SIGMOID COLECTOMY RIGID PROCTOSCOPY  (N/A) as a surgical intervention .  The patient's history has been reviewed, patient examined, no change in status, stable for surgery.  I have reviewed the patient's chart and labs.  Questions were answered to the patient's satisfaction.     Kripa Foskey C.

## 2015-07-11 NOTE — Transfer of Care (Signed)
Immediate Anesthesia Transfer of Care Note  Patient: Dean [name].  Procedure(s) Performed: Procedure(s): XI ROBOT ASSISTED LAPAROSCOPIC SIGMOID COLECTOMY RIGID PROCTOSCOPY AND LYSIS OF ADHESIONS  (N/A)  Patient Location: PACU  Anesthesia Type:General  Level of Consciousness:  sedated, patient cooperative and responds to stimulation  Airway & Oxygen Therapy:Patient Spontanous Breathing and Patient connected to face mask oxgen  Post-op Assessment:  Report given to PACU RN and Post -op Vital signs reviewed and stable  Post vital signs:  Reviewed and stable  Last Vitals:  Filed Vitals:   07/11/15 0644  BP: 139/89  Pulse: 91  Temp: 36.4 C  Resp: 18    Complications: No apparent anesthesia complications

## 2015-07-11 NOTE — Discharge Instructions (Signed)
SURGERY: POST OP INSTRUCTIONS °(Surgery for small bowel obstruction, colon resection, etc)  ° ° °DIET °Follow a light diet the first few days at home.  Start with a bland diet such as soups, liquids, starchy foods, low fat foods, etc.  If you feel full, bloated, or constipated, stay on a ful liquid or pureed/blenderized diet for a few days until you feel better and no longer constipated. °Be sure to drink plenty of fluids every day to avoid getting dehydrated (feeling dizzy, not urinating, etc.). °Gradually add a fiber supplement to your diet over the next week.  Gradually get back to a regular solid diet.  Avoid fast food or heavy meals the first week as you are more likely to get nauseated. °It is expected for your digestive tract to need a few months to get back to normal.  It is common for your bowel movements and stools to be irregular.  You will have occasional bloating and cramping that should eventually fade away.  Until you are eating solid food normally, off all pain medications, and back to regular activities; your bowels will not be normal. °Focus on eating a low-fat, high fiber diet the rest of your life (See Getting to Good Bowel Health, below). ° °CARE of your INCISION or WOUND °It is good for closed incision and even open wounds to be washed every day.  Shower every day.  Short baths are fine.  Wash the incisions and wounds clean with soap & water.    °If you have a closed incision(s), wash the incision with soap & water every day.  You may leave closed incisions open to air if it is dry.   You may cover the incision with clean gauze & replace it after your daily shower for comfort. °If you have skin tapes (Steristrips) or skin glue (Dermabond) on your incision, leave them in place.  They will fall off on their own like a scab.  You may trim any edges that curl up with clean scissors.  If you have staples, set up an appointment for them to be removed in the office in 10 days after surgery.  °If you  have a drain, wash around the skin exit site with soap & water and place a new dressing of gauze or band aid around the skin every day.  Keep the drain site clean & dry.    °If you have an open wound with packing, see wound care instructions.  In general, it is encouraged that you remove your dressing and packing, shower with soap & water, and replace your dressing once a day.  Pack the wound with clean gauze moistened with normal (0.9%) saline to keep the wound moist & uninfected.  Pressure on the dressing for 30 minutes will stop most wound bleeding.  Eventually your body will heal & pull the open wound closed over the next few months.  °Raw open wounds will occasionally bleed or secrete yellow drainage until it heals closed.  Drain sites will drain a little until the drain is removed.  Even closed incisions can have mild bleeding or drainage the first few days until the skin edges scab over & seal.   °If you have an open wound with a wound vac, see wound vac care instructions. ° ° ° ° °ACTIVITIES as tolerated °Start light daily activities --- self-care, walking, climbing stairs-- beginning the day after surgery.  Gradually increase activities as tolerated.  Control your pain to be active.  Stop when you   are tired.  Ideally, walk several times a day, eventually an hour a day.   Most people are back to most day-to-day activities in a few weeks.  It takes 4-8 weeks to get back to unrestricted, intense activity. If you can walk 30 minutes without difficulty, it is safe to try more intense activity such as jogging, treadmill, bicycling, low-impact aerobics, swimming, etc. Save the most intensive and strenuous activity for last (Usually 4-8 weeks after surgery) such as sit-ups, heavy lifting, contact sports, etc.  Refrain from any intense heavy lifting or straining until you are off narcotics for pain control.  You will have off days, but things should improve week-by-week. DO NOT PUSH THROUGH PAIN.  Let pain be  your guide: If it hurts to do something, don't do it.  Pain is your body warning you to avoid that activity for another week until the pain goes down. You may drive when you are no longer taking narcotic prescription pain medication, you can comfortably wear a seatbelt, and you can safely make sudden turns/stops to protect yourself without hesitating due to pain. You may have sexual intercourse when it is comfortable. If it hurts to do something, stop.  MEDICATIONS Take your usually prescribed home medications unless otherwise directed.   Blood thinners:  Usually you can restart any strong blood thinners after the second postoperative day.  It is OK to take aspirin right away.     If you are on strong blood thinners (warfarin/Coumadin, Plavix, Xerelto, Eliquis, Pradaxa, etc), discuss with your surgeon, medicine PCP, and/or cardiologist for instructions on when to restart the blood thinner & if blood monitoring is needed (PT/INR blood check, etc).     PAIN CONTROL Pain after surgery or related to activity is often due to strain/injury to muscle, tendon, nerves and/or incisions.  This pain is usually short-term and will improve in a few months.  To help speed the process of healing and to get back to regular activity more quickly, DO THE FOLLOWING THINGS TOGETHER: 1. Increase activity gradually.  DO NOT PUSH THROUGH PAIN 2. Use Ice and/or Heat 3. Try Gentle Massage and/or Stretching 4. Take over the counter pain medication 5. Take Narcotic prescription pain medication for more severe pain  Good pain control = faster recovery.  It is better to take more medicine to be more active than to stay in bed all day to avoid medications. 1.  Increase activity gradually Avoid heavy lifting at first, then increase to lifting as tolerated over the next 6 weeks. Do not push through the pain.  Listen to your body and avoid positions and maneuvers than reproduce the pain.  Wait a few days before trying  something more intense Walking an hour a day is encouraged to help your body recover faster and more safely.  Start slowly and stop when getting sore.  If you can walk 30 minutes without stopping or pain, you can try more intense activity (running, jogging, aerobics, cycling, swimming, treadmill, sex, sports, weightlifting, etc.) Remember: If it hurts to do it, then dont do it! 2. Use Ice and/or Heat You will have swelling and bruising around the incisions.  This will take several weeks to resolve. Ice packs or heating pads (6-8 times a day, 30-60 minutes at a time) will help sooth soreness & bruising. Some people prefer to use ice alone, heat alone, or alternate between ice & heat.  Experiment and see what works best for you.  Consider trying ice for the first  few days to help decrease swelling and bruising; then, switch to heat to help relax sore spots and speed recovery. Shower every day.  Short baths are fine.  It feels good!  Keep the incisions and wounds clean with soap & water.   3. Try Gentle Massage and/or Stretching Massage at the area of pain many times a day Stop if you feel pain - do not overdo it 4. Take over the counter pain medication This helps the muscle and nerve tissues become less irritable and calm down faster Choose ONE of the following over-the-counter anti-inflammatory medications: Acetaminophen '500mg'$  tabs (Tylenol) 1-2 pills with every meal and just before bedtime (avoid if you have liver problems or if you have acetaminophen in you narcotic prescription) Naproxen '220mg'$  tabs (ex. Aleve, Naprosyn) 1-2 pills twice a day (avoid if you have kidney, stomach, IBD, or bleeding problems) Ibuprofen '200mg'$  tabs (ex. Advil, Motrin) 3-4 pills with every meal and just before bedtime (avoid if you have kidney, stomach, IBD, or bleeding problems) Take with food/snack several times a day as directed for at least 2 weeks to help keep pain / soreness down & more manageable. 5. Take Narcotic  prescription pain medication for more severe pain A prescription for strong pain control is often given to you upon discharge (for example: oxycodone/Percocet, hydrocodone/Norco/Vicodin, or tramadol/Ultram) Take your pain medication as prescribed. Be mindful that most narcotic prescriptions contain Tylenol (acetaminophen) as well - avoid taking too much Tylenol. If you are having problems/concerns with the prescription medicine (does not control pain, nausea, vomiting, rash, itching, etc.), please call us [phone] to see if we need to switch you to a different pain medicine that will work better for you and/or control your side effects better. If you need a refill on your pain medication, you must call the office before 4 pm and on weekdays only.  By federal law, prescriptions for narcotics cannot be called into a pharmacy.  They must be filled out on paper & picked up from our office by the patient or authorized caretaker.  Prescriptions cannot be filled after 4 pm nor on weekends.   WHEN TO CALL us [phone] Severe uncontrolled or worsening pain  Fever over 101 F (38.5 C) Concerns with the incision: Worsening pain, redness, rash/hives, swelling, bleeding, or drainage Reactions / problems with new medications (itching, rash, hives, nausea, etc.) Nausea and/or vomiting Difficulty urinating Difficulty breathing Worsening fatigue, dizziness, lightheadedness, blurred vision Other concerns If you are not getting better after two weeks or are noticing you are getting worse, contact our office [phone] for further advice.  We may need to adjust your medications, re-evaluate you in the office, send you to the emergency room, or see what other things we can do to help. The clinic staff is available to answer your questions during regular business hours (8:30am-5pm).  Please dont hesitate to call and ask to speak to one of our nurses for clinical concerns.    A surgeon from [facility] is always on call at the hospitals 24 hours/day If you have a medical emergency, go to the nearest emergency room or call 911. FOLLOW UP in our office One the day of your discharge from the hospital (or the next business weekday), please call [facility] to set up or confirm an appointment to see your surgeon in the office for a follow-up appointment.  Usually it is 2-3 weeks after your surgery.   If you have skin staples at  your incision(s), let the office know so we can set up a time in the office for the nurse to remove them (usually around 10 days after surgery). Make sure that you call for appointments the day of discharge (or the next business weekday) from the hospital to ensure a convenient appointment time. IF YOU HAVE DISABILITY OR FAMILY LEAVE FORMS, BRING THEM TO THE OFFICE FOR PROCESSING.  DO NOT GIVE THEM TO YOUR DOCTOR.  [facility] [address] [place], [place], [place]  [address] ? [phone] - Main [phone] - [place],  [phone] - Fax [email or web address]  GETTING TO GOOD BOWEL HEALTH. It is expected for your digestive tract to need a few months to get back to normal.  It is common for your bowel movements and stools to be irregular.  You will have occasional bloating and cramping that should eventually fade away.  Until you are eating solid food normally, off all pain medications, and back to regular activities; your bowels will not be normal.   Avoiding constipation The goal: ONE SOFT BOWEL MOVEMENT A DAY!    Drink plenty of fluids.  Choose water first. TAKE A FIBER SUPPLEMENT EVERY DAY THE REST OF YOUR LIFE During your first week back home, gradually add back a fiber supplement every day Experiment which form you can tolerate.   There are many forms such as powders, tablets, wafers, gummies, etc Psyllium bran (Metamucil), methylcellulose (Citrucel), Miralax or Glycolax, Benefiber, Flax Seed.    Adjust the dose week-by-week (1/2 dose/day to 6 doses a day) until you are moving your bowels 1-2 times a day.  Cut back the dose or try a different fiber product if it is giving you problems such as diarrhea or bloating. Sometimes a laxative is needed to help jump-start bowels if constipated until the fiber supplement can help regulate your bowels.  If you are tolerating eating & you are farting, it is okay to try a gentle laxative such as double dose MiraLax, prune juice, or Milk of Magnesia.  Avoid using laxatives too often. Stool softeners can sometimes help counteract the constipating effects of narcotic pain medicines.  It can also cause diarrhea, so avoid using for too long. If you are still constipated despite taking fiber daily, eating solids, and a few doses of laxatives, call our office. Controlling diarrhea Try drinking liquids and eating bland foods for a few days to avoid stressing your intestines further. Avoid dairy products (especially milk & ice cream) for a short time.  The intestines often can lose the ability to digest lactose when stressed. Avoid foods that cause gassiness or bloating.  Typical foods include beans and other legumes, cabbage, broccoli, and dairy foods.  Avoid greasy, spicy, fast foods.  Every person has some sensitivity to other foods, so listen to your body and avoid those foods that trigger problems for you. Probiotics (such as active yogurt, Align, etc) may help repopulate the intestines and colon with normal bacteria and calm down a sensitive digestive tract Adding a fiber supplement gradually can help thicken stools by absorbing excess fluid and retrain the intestines to act more normally.  Slowly increase the dose over a few weeks.  Too much fiber too soon can backfire and cause cramping & bloating. It is okay to try and slow down diarrhea with a few doses of antidiarrheal medicines.   Bismuth subsalicylate (ex. Kayopectate, Pepto Bismol) for a few doses can  help control diarrhea.  Avoid if pregnant.  Loperamide (Imodium) can slow down diarrhea.  Start with one tablet ('2mg'$ ) first.  Avoid if you are having fevers or severe pain.  ILEOSTOMY PATIENTS WILL HAVE CHRONIC DIARRHEA since their colon is not in use.    Drink plenty of liquids.  You will need to drink even more glasses of water/liquid a day to avoid getting dehydrated. Record output from your ileostomy.  Expect to empty the bag every 3-4 hours at first.  Most people with a permanent ileostomy empty their bag 4-6 times at the least.   Use antidiarrheal medicine (especially Imodium) several times a day to avoid getting dehydrated.  Start with a dose at bedtime & breakfast.  Adjust up or down as needed.  Increase antidiarrheal medications as directed to avoid emptying the bag more than 8 times a day (every 3 hours). Work with your wound ostomy nurse to learn care for your ostomy.  See ostomy care instructions. TROUBLESHOOTING IRREGULAR BOWELS 1) Start with a soft & bland diet. No spicy, greasy, or fried foods.  2) Avoid gluten/wheat or dairy products from diet to see if symptoms improve. 3) Miralax 17gm or flax seed mixed in [place]. water or juice-daily. May use 2-4 times a day as needed. 4) Gas-X, Phazyme, etc. as needed for gas & bloating.  5) Prilosec (omeprazole) over-the-counter as needed 6)  Consider probiotics (Align, Activa, etc) to help calm the bowels down  Call your doctor if you are getting worse or not getting better.  Sometimes further testing (cultures, endoscopy, X-ray studies, CT scans, bloodwork, etc.) may be needed to help diagnose and treat the cause of the diarrhea. [facility], [place], [place], [place], [place]  [address] [phone] - Main.    [phone]  - Toll Free.   [phone] - Fax [email or web address]  Managing Pain  Pain after surgery or related to activity is often due to strain/injury to muscle, tendon, nerves  and/or incisions.  This pain is usually short-term and will improve in a few months.   Many people find it helpful to do the following things TOGETHER to help speed the process of healing and to get back to regular activity more quickly:  1. Avoid heavy physical activity at first a. No lifting greater than 20 pounds at first, then increase to lifting as tolerated over the next few weeks b. Do not push through the pain.  Listen to your body and avoid positions and maneuvers than reproduce the pain.  Wait a few days before trying something more intense c. Walking is okay as tolerated, but go slowly and stop when getting sore.  If you can walk 30 minutes without stopping or pain, you can try more intense activity (running, jogging, aerobics, cycling, swimming, treadmill, sex, sports, weightlifting, etc ) d. Remember: If it hurts to do it, then dont do it!  2. Take Anti-inflammatory medication a. Choose ONE of the following over-the-counter medications: i.            Acetaminophen '500mg'$  tabs (Tylenol) 1-2 pills with every meal and just before bedtime (avoid if you have liver problems) ii.            Naproxen '220mg'$  tabs (ex. Aleve) 1-2 pills twice a day (avoid if you have kidney, stomach, IBD, or bleeding problems) iii. Ibuprofen '200mg'$  tabs (ex. Advil, Motrin) 3-4 pills with every meal and just before bedtime (avoid if you have kidney, stomach, IBD, or bleeding problems) b. Take with food/snack around the clock for  1-2 weeks i. This helps the muscle and nerve tissues become less irritable and calm down faster  3. Use a Heating pad or Ice/Cold Pack a. 4-6 times a day b. May use warm bath/hottub  or showers  4. Try Gentle Massage and/or Stretching  a. at the area of pain many times a day b. stop if you feel pain - do not overdo it  Try these steps together to help you body heal faster and avoid making things get worse.  Doing just one of these things may not be enough.    If you are not  getting better after two weeks or are noticing you are getting worse, contact our office for further advice; we may need to re-evaluate you & see what other things we can do to help.  Diverticulitis Diverticulitis is inflammation or infection of small pouches in your colon that form when you have a condition called diverticulosis. The pouches in your colon are called diverticula. Your colon, or large intestine, is where water is absorbed and stool is formed. Complications of diverticulitis can include:  Bleeding.  Severe infection.  Severe pain.  Perforation of your colon.  Obstruction of your colon. CAUSES  Diverticulitis is caused by bacteria. Diverticulitis happens when stool becomes trapped in diverticula. This allows bacteria to grow in the diverticula, which can lead to inflammation and infection. RISK FACTORS People with diverticulosis are at risk for diverticulitis. Eating a diet that does not include enough fiber from fruits and vegetables may make diverticulitis more likely to develop. SYMPTOMS  Symptoms of diverticulitis may include:  Abdominal pain and tenderness. The pain is normally located on the left side of the abdomen, but may occur in other areas.  Fever and chills.  Bloating.  Cramping.  Nausea.  Vomiting.  Constipation.  Diarrhea.  Blood in your stool. DIAGNOSIS  Your health care provider will ask you about your medical history and do a physical exam. You may need to have tests done because many medical conditions can cause the same symptoms as diverticulitis. Tests may include:  Blood tests.  Urine tests.  Imaging tests of the abdomen, including X-rays and CT scans. When your condition is under control, your health care provider may recommend that you have a colonoscopy. A colonoscopy can show how severe your diverticula are and whether something else is causing your symptoms. TREATMENT  Most cases of diverticulitis are mild and can be treated  at home. Treatment may include:  Taking over-the-counter pain medicines.  Following a clear liquid diet.  Taking antibiotic medicines by mouth for 7-10 days. More severe cases may be treated at a hospital. Treatment may include:  Not eating or drinking.  Taking prescription pain medicine.  Receiving antibiotic medicines through an IV tube.  Receiving fluids and nutrition through an IV tube.  Surgery. HOME CARE INSTRUCTIONS   Follow your health care provider's instructions carefully.  Follow a full liquid diet or other diet as directed by your health care provider. After your symptoms improve, your health care provider may tell you to change your diet. He or she may recommend you eat a high-fiber diet. Fruits and vegetables are good sources of fiber. Fiber makes it easier to pass stool.  Take fiber supplements or probiotics as directed by your health care provider.  Only take medicines as directed by your health care provider.  Keep all your follow-up appointments. SEEK MEDICAL CARE IF:   Your pain does not improve.  You have a hard time  eating food.  Your bowel movements do not return to normal. SEEK IMMEDIATE MEDICAL CARE IF:   Your pain becomes worse.  Your symptoms do not get better.  Your symptoms suddenly get worse.  You have a fever.  You have repeated vomiting.  You have bloody or black, tarry stools. MAKE SURE YOU:   Understand these instructions.  Will watch your condition.  Will get help right away if you are not doing well or get worse.   This information is not intended to replace advice given to you by your health care provider. Make sure you discuss any questions you have with your health care provider.   Document Released: 10/30/2004 Document Revised: 01/25/2013 Document Reviewed: 12/15/2012 Elsevier Interactive Patient Education [employer].

## 2015-07-11 NOTE — Anesthesia Procedure Notes (Signed)
Procedure Name: Intubation Date/Time: 07/11/2015 8:46 AM Performed by: Lajuana Carry E Pre-anesthesia Checklist: Patient identified, Emergency Drugs available, Suction available and Patient being monitored Patient Re-evaluated:Patient Re-evaluated prior to inductionOxygen Delivery Method: Circle system utilized Preoxygenation: Pre-oxygenation with 100% oxygen Intubation Type: IV induction Ventilation: Mask ventilation without difficulty Laryngoscope Size: Miller and 2 Grade View: Grade I Tube type: Oral Tube size: 7.5 mm Number of attempts: 1 Airway Equipment and Method: Stylet and Oral airway Placement Confirmation: ETT inserted through vocal cords under direct vision,  positive ETCO2 and breath sounds checked- equal and bilateral Secured at: 22 cm Tube secured with: Tape Dental Injury: Teeth and Oropharynx as per pre-operative assessment

## 2015-07-11 NOTE — H&P (Signed)
Dean [name] Taiden Raybourn 05/28/2015 9:30 AM Location: [facility] Patient #: [id number] DOB: 08-19-1935 Married / Language: Cleophus Molt / Race: White Male  Patient Care Team: [name], MD as PCP - General (Family Medicine) Gery Pray, MD as Consulting Physician (Radiation Oncology) [name], MD as Consulting Physician (Hematology and Oncology) Danie Binder, MD as Consulting Physician (Gastroenterology) Michael Boston, MD as Consulting Physician (General Surgery)  History of Present Illness  The patient is a 80 year old male who presents with diverticulitis. Note for "Diverticulitis": Pleasant patient sent over concerns of diverticulitis with large abscess in December 2016.  Pleasant obese male. Developed worsening fatigue and bowel changes. Being treated for multiple myeloma. Found to have evidence of diverticulitis on a PET scan fifth of December. I believe placed on antibiotics. Worsened. Admitted 2 weeks later in December with diverticulitis with a 6.5 cm abscesses. Required IV antibiotics and percutaneous drainage. Eventually the abscess resolve and was removed in the outpatient setting. He never followed up with [place]. Apparently had CT scan a month later that showed no diverticulitis. PET scan couple weeks later showed mild inflammation but no abscess. His family brought him in today. He is adamantly against any potential ostomy.  Since he's left the hospital, he is feeling better. Appetite somewhat down and still little weak but recovering. Moves his bowels most days. He did get a colonoscopy by Dr. Lovey Newcomer fields last month which showed diverticulosis. No cancer. Patient's never had any abdominal surgery. [place] has to stop. His pain or shortness of breath. He notes he has a cousin that required emergency surgery with a colostomy. Cancer. He's apparently had "17 operations". Recurrent parastomal hernias.  Incisional hernias. Intra-abdominal abscesses. He is convinced that he will have those problems if he has surgery. He does not smoke. He is not a diabetic. Somewhat compliant with his doctors.  Also, he was diagnosed with multiple myeloma. His felt he could benefit from treatment but that is on hold given the fact he had this significant diverticulitis episode. Concern of recurrent infections and abscesses should he go back on that. Therefore, surgical consultation requested to reevaluate the patient by his oncologist, Dr. Whitney Muse. Also recommended by Dr. Oneida Alar, his gastroenterologist. Also recommended by his primary care physician, Dr. Dennard Schaumann.  No personal nor family history of GI/colon cancer, inflammatory bowel disease, irritable bowel syndrome, allergy such as Celiac Sprue, dietary/dairy problems, colitis, ulcers nor gastritis. No recent sick contacts/gastroenteritis. No travel outside the country. No changes in diet. No dysphagia to solids or liquids. No significant heartburn or reflux. No hematochezia, hematemesis, coffee ground emesis. No evidence of prior gastric/peptic ulceration.   Other Problems Elbert Ewings, CMA; 05/28/2015 9:31 AM) Arthritis Back Pain Cancer  Diagnostic Studies History Elbert Ewings, CMA; 05/28/2015 9:31 AM) Colonoscopy within last year  Allergies Elbert Ewings, CMA; 05/28/2015 9:31 AM) Codeine Sulfate *ANALGESICS - OPIOID* Morphine Sulfate (Concentrate) *ANALGESICS - OPIOID*  Medication History Elbert Ewings, CMA; 05/28/2015 9:33 AM) ALPRAZolam (1MG Tablet, Oral) Active. Calcium-Vitamin D (600MG Tablet Chewable, Oral) Active. Aspirin (81MG Tablet, Oral) Active. Benadryl Allergy (25MG Capsule, Oral) Active. EpiPen 2-Pak (0.3MG/0.3ML Soln Auto-inj, Injection as needed) Active. Famotidine (20MG Tablet, Oral) Active. Gabapentin (400MG Capsule, Oral) Active. Omeprazole (20MG Capsule DR, Oral) Active. Tamsulosin HCl (0.4MG Capsule,  Oral) Active. Zometa (4MG/100ML Solution, Intravenous once a month) Active. Medications Reconciled  Social History Elbert Ewings, [place]; 05/28/2015 9:31 AM) Alcohol use Remotely quit alcohol use. Caffeine  use Carbonated beverages, Coffee, Tea. No drug use Tobacco use Never smoker.  Family History Elbert Ewings, [place]; 05/28/2015 9:31 AM) Alcohol Abuse Brother. Arthritis Father, Mother. Hypertension Father, Mother. Kidney Disease Daughter.    Review of Systems Elbert Ewings CMA; 05/28/2015 9:31 AM) General Present- Appetite Loss and Fatigue. Not Present- Chills, Fever, Night Sweats, Weight Gain and Weight Loss. Skin Not Present- Change in Wart/Mole, Dryness, Hives, Jaundice, New Lesions, Non-Healing Wounds, Rash and Ulcer. HEENT Present- Seasonal Allergies. Not Present- Earache, Hearing Loss, Hoarseness, Nose Bleed, Oral Ulcers, Ringing in the Ears, Sinus Pain, Sore Throat, Visual Disturbances, Wears glasses/contact lenses and Yellow Eyes. Respiratory Not Present- Bloody sputum, Chronic Cough, Difficulty Breathing, Snoring and Wheezing. Breast Not Present- Breast Mass, Breast Pain, Nipple Discharge and Skin Changes. Cardiovascular Present- Leg Cramps and Swelling of Extremities. Not Present- Chest Pain, Difficulty Breathing Lying Down, Palpitations, Rapid Heart Rate and Shortness of Breath. Male Genitourinary Not Present- Blood in Urine, Change in Urinary Stream, Frequency, Impotence, Nocturia, Painful Urination, Urgency and Urine Leakage. Musculoskeletal Present- Joint Pain, Muscle Weakness and Swelling of Extremities. Not Present- Back Pain, Joint Stiffness and Muscle Pain. Neurological Present- Numbness. Not Present- Decreased Memory, Fainting, Headaches, Seizures, Tingling, Tremor, Trouble walking and Weakness. Psychiatric Not Present- Anxiety, Bipolar, Change in Sleep Pattern, Depression, Fearful and Frequent crying. Endocrine Not Present- Cold Intolerance, Excessive Hunger, Hair  Changes, Heat Intolerance, Hot flashes and New Diabetes. Hematology Not Present- Easy Bruising, Excessive bleeding, Gland problems, HIV and Persistent Infections.  Vitals Elbert Ewings CMA; 05/28/2015 9:34 AM) 05/28/2015 9:33 AM Weight: 237 lb Temp.: 97.73F  Pulse: 80 (Regular)  BP: 138/82 (Sitting, Left Arm, Standard)       Physical Exam [name] MD; 05/28/2015 10:11 AM) General Mental Status-Alert. General Appearance-Not in acute distress, Not Sickly. Orientation-Oriented X3. Hydration-Well hydrated. Voice-Normal.  Integumentary Global Assessment Upon inspection and palpation of skin surfaces of the - Axillae: non-tender, no inflammation or ulceration, no drainage. and Distribution of scalp and body hair is normal. General Characteristics Temperature - normal warmth is noted.  Head and Neck Head-normocephalic, atraumatic with no lesions or palpable masses. Face Global Assessment - atraumatic, no absence of expression. Neck Global Assessment - no abnormal movements, no bruit auscultated on the right, no bruit auscultated on the left, no decreased range of motion, non-tender. Trachea-midline. Thyroid Gland Characteristics - non-tender.  Eye Eyeball - Left-Extraocular movements intact, No Nystagmus. Eyeball - Right-Extraocular movements intact, No Nystagmus. Cornea - Left-No Hazy. Cornea - Right-No Hazy. Sclera/Conjunctiva - Left-No scleral icterus, No Discharge. Sclera/Conjunctiva - Right-No scleral icterus, No Discharge. Pupil - Left-Direct reaction to light normal. Pupil - Right-Direct reaction to light normal.  ENMT Ears Pinna - Left - no drainage observed, no generalized tenderness observed. Right - no drainage observed, no generalized tenderness observed. Nose and Sinuses External Inspection of the Nose - no destructive lesion observed. Inspection of the nares - Left - quiet respiration. Right - quiet  respiration. Mouth and Throat Lips - Upper Lip - no fissures observed, no pallor noted. Lower Lip - no fissures observed, no pallor noted. Nasopharynx - no discharge present. Oral Cavity/Oropharynx - Tongue - no dryness observed. Oral Mucosa - no cyanosis observed. Hypopharynx - no evidence of airway distress observed.  Chest and Lung Exam Inspection Movements - Normal and Symmetrical. Accessory muscles - No use of accessory muscles in breathing. Palpation Palpation of the chest reveals - Non-tender. Auscultation Breath sounds - Normal and Clear.  Cardiovascular Auscultation Rhythm - Regular. Murmurs & Other  Heart Sounds - Auscultation of the heart reveals - No Murmurs and No Systolic Clicks.  Abdomen Inspection Inspection of the abdomen reveals - No Visible peristalsis and No Abnormal pulsations. Umbilicus - No Bleeding, No Urine drainage. Palpation/Percussion Palpation and Percussion of the abdomen reveal - Soft, Non Tender, No Rebound tenderness, No Rigidity (guarding) and No Cutaneous hyperesthesia. Note: Morbidly obese but soft. No guarding. No rebound tenderness. No umbilical hernia. Mild diastases.   Male Genitourinary Sexual Maturity Tanner 5 - Adult hair pattern and Adult penile size and shape. Note: No inguinal hernias. Normal external genitalia.   Peripheral Vascular Upper Extremity Inspection - Left - No Cyanotic nailbeds, Not Ischemic. Right - No Cyanotic nailbeds, Not Ischemic.  Neurologic Neurologic evaluation reveals -normal attention span and ability to concentrate, able to name objects and repeat phrases. Appropriate fund of knowledge , normal sensation and normal coordination. Mental Status Affect - not angry, not paranoid. Cranial Nerves-Normal Bilaterally. Gait-Normal.  Neuropsychiatric Mental status exam performed with findings of-able to articulate well with normal speech/language, rate, volume and coherence, thought content normal with  ability to perform basic computations and apply abstract reasoning and no evidence of hallucinations, delusions, obsessions or homicidal/suicidal ideation.  Musculoskeletal Global Assessment Spine, Ribs and Pelvis - no instability, subluxation or laxity. Right Upper Extremity - no instability, subluxation or laxity.  Lymphatic Head & Neck  General Head & Neck Lymphatics: Bilateral - Description - No Localized lymphadenopathy. Axillary  General Axillary Region: Bilateral - Description - No Localized lymphadenopathy. Femoral & Inguinal  Generalized Femoral & Inguinal Lymphatics: Left - Description - No Localized lymphadenopathy. Right - Description - No Localized lymphadenopathy.    Assessment & Plan  DIVERTICULITIS OF LARGE INTESTINE WITH ABSCESS WITHOUT BLEEDING (K57.20) Impression: Obese male with diverticulitis with 6.5 cm large abscess requiring prolonged hospital stay, IV antibiotics, percutaneous drainage. Some persistent malaise but no evidence of recurrence. Also has multiple myeloma. Not able to do chemotherapy given concerns of recurrent attack.  Standard of care would be segmental sigmoid colectomy. He's adamantly against any colostomy. I noted that there is a 95% chance we could do this electively and that anastomosis will heal, avoiding colostomy; but, I cautioned him that I cannot give 100% guarantee. He is obsessed with his cousin's issues of emergency colon surgeries for cancer with multiple problems including recurrent hernias, intra-abdominal abscesses, etc. I noted the best way to minimize any future problems and allow him to proceed with chemotherapy for his myeloma is to proceed with elective resection. Reasonable robotic approach. He is interested. His performance status seems tolerable. We'll work to coordinate a convenient time.  ENCOUNTER FOR PREOPERATIVE EXAMINATION FOR GENERAL SURGICAL PROCEDURE (Z01.818) Current Plans You are being scheduled for surgery - Our  schedulers will call you.  You should hear from our office's scheduling department within 5 working days about the location, date, and time of surgery. We try to make accommodations for patient's preferences in scheduling surgery, but sometimes the OR schedule or the surgeon's schedule prevents [place] from making those accommodations.  If you have not heard from our office [phone]) in 5 working days, call the office and ask for your surgeon's nurse.  If you have other questions about your diagnosis, plan, or surgery, call the office and ask for your surgeon's nurse.  Written instructions provided Pt Education - [facility] Colon Bowel Prep 2015 Miralax/Antibiotics Started Neomycin Sulfate 500MG, 2 (two) Tablet SEE NOTE, #6, 05/28/2015, No Refill. Local Order: TAKE TWO TABLETS AT 2 PM, 3 PM,  AND 10 PM THE DAY PRIOR TO SURGERY Started Flagyl 500MG, 2 (two) Tablet SEE NOTE, #6, 05/28/2015, No Refill. Local Order: Take at 2pm, 3pm, and 10pm the day prior to your colon operation Pt Education - [facility] Colectomy post-op instructions: discussed with patient and provided information. Pt Education - Pamphlet Given - Laparoscopic Colorectal Surgery: discussed with patient and provided information. Pt Education - [facility] Good Bowel Health (Jeremey Bascom) Pt Education - [facility] Pain Control (Mohammad Granade)  [name], M.D., F.A.C.S. Gastrointestinal and Minimally Invasive Surgery [facility] [address], [place], [place] [address] [phone] Main / Paging

## 2015-07-12 LAB — BASIC METABOLIC PANEL
Anion gap: 5 (ref 5–15)
BUN: 16 mg/dL (ref 6–20)
CO2: 23 mmol/L (ref 22–32)
Calcium: 8.2 mg/dL — ABNORMAL LOW (ref 8.9–10.3)
Chloride: 110 mmol/L (ref 101–111)
Creatinine, Ser: 0.68 mg/dL (ref 0.61–1.24)
GFR calc Af Amer: >60 mL/min (ref >60)
GFR calc non Af Amer: >60 mL/min (ref >60)
Glucose, Bld: 121 mg/dL — ABNORMAL HIGH (ref 65–99)
Potassium: 4.2 mmol/L (ref 3.5–5.1)
Sodium: 138 mmol/L (ref 135–145)

## 2015-07-12 LAB — CBC
HCT: 35.9 % — ABNORMAL LOW (ref 39.0–52.0)
Hemoglobin: 12.1 g/dL — ABNORMAL LOW (ref 13.0–17.0)
MCH: 28.1 pg (ref 26.0–34.0)
MCHC: 33.7 g/dL (ref 30.0–36.0)
MCV: 83.5 fL (ref 78.0–100.0)
Platelets: 132 10*3/uL — ABNORMAL LOW (ref 150–400)
RBC: 4.3 MIL/uL (ref 4.22–5.81)
RDW: 14.6 % (ref 11.5–15.5)
WBC: 9.2 10*3/uL (ref 4.0–10.5)

## 2015-07-12 LAB — MAGNESIUM: Magnesium: 2 mg/dL (ref 1.7–2.4)

## 2015-07-12 MED ORDER — OXYCODONE-ACETAMINOPHEN 5-325 MG PO TABS
1.0000 | ORAL_TABLET | ORAL | Status: DC | PRN
  Administered 2015-07-12: 1 via ORAL
  Filled 2015-07-12: qty 1

## 2015-07-12 MED ORDER — OXYCODONE HCL 5 MG PO TABS: 5.0000 mg | ORAL_TABLET | ORAL | Status: DC | PRN

## 2015-07-12 MED ORDER — DIPHENHYDRAMINE HCL 50 MG/ML IJ SOLN: 12.5000 mg | Freq: Four times a day (QID) | INTRAMUSCULAR | Status: DC | PRN

## 2015-07-12 MED ORDER — OXYCODONE-ACETAMINOPHEN 10-325 MG PO TABS: 1.0000 | ORAL_TABLET | ORAL | Status: DC | PRN

## 2015-07-12 MED ORDER — DIPHENHYDRAMINE HCL 12.5 MG/5ML PO ELIX
12.5000 mg | ORAL_SOLUTION | Freq: Four times a day (QID) | ORAL | Status: DC | PRN
Start: 2015-07-12 — End: 2015-07-13

## 2015-07-12 MED ORDER — ENSURE ENLIVE PO LIQD
237.0000 mL | Freq: Two times a day (BID) | ORAL | Status: DC
  Administered 2015-07-12 – 2015-07-13 (×3): 237 mL via ORAL

## 2015-07-12 NOTE — Progress Notes (Addendum)
Initial Nutrition Assessment  DOCUMENTATION CODES:   Obesity unspecified  INTERVENTION:  -Ensure Enlive po BID, each supplement provides 350 kcal and 20 grams of protein  -[employer] Breakfast 2 packets with breakfast, provides 260 calories and 10g of protein  NUTRITION DIAGNOSIS:   Inadequate oral intake related to chronic illness as evidenced by energy intake < or equal to 50% for > or equal to 1 month, per patient/family report, severe depletion of muscle mass.  GOAL:   Patient will meet greater than or equal to 90% of their needs  MONITOR:   PO intake, Supplement acceptance, I & O's, Labs, Weight trends  REASON FOR ASSESSMENT:   Malnutrition Screening Tool    ASSESSMENT:   The patient is a 80 year old male who presents with diverticulitis. Note for "Diverticulitis": Pleasant patient sent over concerns of diverticulitis with large abscess in December 2016. Pleasant obese male. Developed worsening fatigue and bowel changes. Being treated for multiple myeloma. Found to have evidence of diverticulitis on a PET scan fifth of December. I believe placed on antibiotics. Worsened. Admitted 2 weeks later in December with diverticulitis with a 6.5 cm abscesses. Required IV antibiotics and percutaneous drainage  Spoke with Mr. Llamas and his wife at bedside. Mr. Catterton has multiple myleoma that he states in remission, but also presents with diverticulitis s/p sigmoid colectomy. He endorses losing 76# over 1.5 years, but exhibits no recent weight loss per chart, wt stable x6 months. At home, he hasn't been eating much, exhibits taste alterations -- foods tasting bland. He normally does 2 meals per day, with sausage, gravy, eggs, maybe toast in the morning, that he doesn't eat much of. His wife will cook him dinner in the evening and he follows a similar pattern of generally poor intake. He states that his wife does force him to eat, which is likely why patient's weight has been  stable. Unable to dx malnutrition at this time.  Nutrition-Focused physical exam completed. Findings are moderate fat depletion at orbitals, severe muscle depletion in upper body, no depletion in lower body, and no edema.   Will order CIB and EE.  Diet Order:  Diet full liquid Room service appropriate?: Yes; Fluid consistency:: Thin  Skin:  Reviewed, no issues (Closed incision to abdomen)  Last BM:  07/11/2015  Height:   Ht Readings from Last 1 Encounters:  07/11/15 5' 8"  (1.727 m)    Weight:   Wt Readings from Last 1 Encounters:  07/12/15 239 lb 10.2 oz (108.7 kg)    Ideal Body Weight:  70 kg  BMI:  Body mass index is 36.45 kg/(m^2).  Estimated Nutritional Needs:   Kcal:  1600-2000 calories  Protein:  70-85 grams  Fluid:  >/= 1.6L  EDUCATION NEEDS:   No education needs identified at this time  Satira Anis. Kirin Pastorino, MS, RD LDN Inpatient Clinical Dietitian Pager [phone]

## 2015-07-12 NOTE — Progress Notes (Signed)
[facility]  [address] [place], [place] [address] Phone: [phone] FAX: [phone]   Dean [name] [id number] [birth year]  CARE TEAM:  PCP: [name], MD  Outpatient Care Team: Patient Care Team: [name], MD as PCP - General (Family Medicine) Gery Pray, MD as Consulting Physician (Radiation Oncology) [name], MD as Consulting Physician (Hematology and Oncology) Danie Binder, MD as Consulting Physician (Gastroenterology) Michael Boston, MD as Consulting Physician (General Surgery)  Inpatient Treatment Team: Treatment Team: Attending Provider: Michael Boston, MD; Technician: Resa Miner, NT; Technician: Rhae Hammock, NT; Technician: Tenna Child, NT; Registered Nurse: Mortimer Fries, RN  Problem List:   Principal Problem:   Diverticulitis s/p robotic sigmoid colectomy 07/12/2015 Active Problems:   Neuropathy ([place])   BPH (benign prostatic hyperplasia)   GERD (gastroesophageal reflux disease)   H/O ETOH abuse   Insomnia   Multiple myeloma without remission ([place])   1 Day Post-Op  07/11/2015  POST-OPERATIVE DIAGNOSIS: Sigmoid diverticulitis with prior abcess   PROCEDURE:  XI ROBOT ASSISTED SIGMOID COLECTOMY ROBOTIC LYSIS OF ADHESIONS  RIGID PROCTOSCOPY  SURGEON: Michael Boston, MD   Assessment  Recovering  Plan:  -adv diet per protocol -chronic pain control -anxiolysis -f/u pathology -GERD control -VTE prophylaxis- SCDs, etc -mobilize as tolerated to help recovery  I updated the patient's status to the patient and nurse.  Recommendations were made.  Questions were answered.  They expressed understanding & appreciation.   [name], M.D., F.A.C.S. Gastrointestinal and Minimally Invasive Surgery [facility] [address], [place], [place] [address] [phone] Main /  Paging   07/12/2015  Subjective:  Mild "twinge" at extraction incision when walking Walked in hallway Tol liquids  Objective:  Vital signs:  Filed Vitals:   07/11/15 2154 07/12/15 0130 07/12/15 0500 07/12/15 0610  BP: 124/63 113/56  120/48  Pulse: 69 66  70  Temp: 98 F (36.7 C) 97.9 F (36.6 C)  97.9 F (36.6 C)  TempSrc: Oral Oral  Oral  Resp: _0 Height:      Weight:   108.7 kg (239 lb 10.2 oz)   SpO2: 95% 93%  96%    Last BM Date: 07/11/15  Intake/Output   Yesterday:  06/07 0701 - 06/08 0700 In: 3020.8 [P.O.:240; I.V.:2680.8; IV Piggyback:100] Out: 2170 [Urine:2120; Blood:50] This shift:     Bowel function:  Flatus: YES  BM:  No  Drain: (No drain)   Physical Exam:  General: Pt awake/alert/oriented x4 in No acute distress Eyes: PERRL, normal EOM.  Sclera clear.  No icterus Neuro: CN II-XII intact w/o focal sensory/motor deficits. Lymph: No head/neck/groin lymphadenopathy Psych:  No delerium/psychosis/paranoia HENT: Normocephalic, Mucus membranes moist.  No thrush Neck: Supple, No tracheal deviation Chest: No chest wall pain w good excursion CV:  Pulses intact.  Regular rhythm MS: Normal AROM mjr joints.  No obvious deformity Abdomen: Soft.  Nondistended.  Mildly tender at incisions only.  No evidence of peritonitis.  No incarcerated hernias. Ext:  SCDs BLE.  No mjr edema.  No cyanosis Skin: No petechiae / purpura  Results:   Labs: Results for orders placed or performed during the hospital encounter of 07/11/15 (from the past 48 hour(s))  Basic metabolic panel     Status: Abnormal   Collection Time: 07/12/15  4:15 AM  Result Value Ref Range   Sodium 138 135 - 145 mmol/L   Potassium  4.2 3.5 - 5.1 mmol/L   Chloride 110 101 - 111 mmol/L   CO2 23 22 - 32 mmol/L   Glucose, Bld 121 (H) 65 - 99 mg/dL   BUN 16 6 - 20 mg/dL   Creatinine, Ser 0.68 0.61 - 1.24 mg/dL   Calcium 8.2 (L) 8.9 - 10.3 mg/dL   GFR calc non Af Amer >60 >60 mL/min    GFR calc Af Amer >60 >60 mL/min    Comment: (NOTE) The eGFR has been calculated using the CKD EPI equation. This calculation has not been validated in all clinical situations. eGFR's persistently <60 mL/min signify possible Chronic Kidney Disease.    Anion gap 5 5 - 15  CBC     Status: Abnormal   Collection Time: 07/12/15  4:15 AM  Result Value Ref Range   WBC 9.2 4.0 - 10.5 K/uL   RBC 4.30 4.22 - 5.81 MIL/uL   Hemoglobin 12.1 (L) 13.0 - 17.0 g/dL   HCT 35.9 (L) 39.0 - 52.0 %   MCV 83.5 78.0 - 100.0 fL   MCH 28.1 26.0 - 34.0 pg   MCHC 33.7 30.0 - 36.0 g/dL   RDW 14.6 11.5 - 15.5 %   Platelets 132 (L) 150 - 400 K/uL  Magnesium     Status: None   Collection Time: 07/12/15  4:15 AM  Result Value Ref Range   Magnesium 2.0 1.7 - 2.4 mg/dL    Imaging / Studies: No results found.  Medications / Allergies: per chart  Antibiotics: Anti-infectives    Start     Dose/Rate Route Frequency Ordered Stop   07/11/15 2100  cefoTEtan (CEFOTAN) 2 g in dextrose 5 % 50 mL IVPB     2 g 100 mL/hr over 30 Minutes Intravenous Every 12 hours 07/11/15 1257 07/11/15 2227   07/11/15 1137  clindamycin (CLEOCIN) 900 mg, gentamicin (GARAMYCIN) 240 mg in sodium chloride 0.9 % 1,000 mL for intraperitoneal lavage  Status:  Discontinued       As needed 07/11/15 1137 07/11/15 1159   07/11/15 0800  clindamycin (CLEOCIN) 900 mg, gentamicin (GARAMYCIN) 240 mg in sodium chloride 0.9 % 1,000 mL for intraperitoneal lavage  Status:  Discontinued    Comments:  Pharmacy may adjust dosing strength, schedule, rate of infusion, etc as needed to optimize therapy    Intraperitoneal To Surgery 07/10/15 1341 07/11/15 1248   07/11/15 0654  cefoTEtan (CEFOTAN) 2 g in dextrose 5 % 50 mL IVPB     2 g 100 mL/hr over 30 Minutes Intravenous On call to O.R. 07/11/15 0654 07/11/15 0915        Note: Portions of this report may have been transcribed using voice recognition software. Every effort was made to ensure accuracy;  however, inadvertent computerized transcription errors may be present.   Any transcriptional errors that result from this process are unintentional.     [name], M.D., F.A.C.S. Gastrointestinal and Minimally Invasive Surgery [facility] [address], [place], [place] [address] [phone] Main / Paging   07/12/2015

## 2015-07-13 LAB — TYPE AND SCREEN
ABO/RH(D): B POS
Antibody Screen: NEGATIVE

## 2015-07-13 MED ORDER — SODIUM CHLORIDE 0.9% FLUSH: 3.0000 mL | INTRAVENOUS | Status: DC | PRN

## 2015-07-13 MED ORDER — ACETAMINOPHEN 325 MG PO TABS
650.0000 mg | ORAL_TABLET | Freq: Three times a day (TID) | ORAL | Status: DC
  Administered 2015-07-13: 650 mg via ORAL
  Filled 2015-07-13: qty 2

## 2015-07-13 MED ORDER — SODIUM CHLORIDE 0.9% FLUSH
3.0000 mL | Freq: Two times a day (BID) | INTRAVENOUS | Status: DC
  Administered 2015-07-13: 3 mL via INTRAVENOUS

## 2015-07-13 MED ORDER — SODIUM CHLORIDE 0.9 % IV SOLN: 250.0000 mL | INTRAVENOUS | Status: DC | PRN

## 2015-07-13 NOTE — Discharge Summary (Signed)
Physician Discharge Summary  Patient ID: Dean [name]. MRN: [id number] DOB/AGE: 78-Oct-1937 78 y.o.  Admit date: 07/11/2015 Discharge date: 07/13/2015  Patient Care Team: [name], MD as PCP - General (Family Medicine) Gery Pray, MD as Consulting Physician (Radiation Oncology) [name], MD as Consulting Physician (Hematology and Oncology) Danie Binder, MD as Consulting Physician (Gastroenterology) Michael Boston, MD as Consulting Physician (General Surgery)  Admission Diagnoses: Principal Problem:   Diverticulitis s/p robotic sigmoid colectomy 07/12/2015 Active Problems:   Neuropathy ([place])   BPH (benign prostatic hyperplasia)   GERD (gastroesophageal reflux disease)   H/O ETOH abuse   Insomnia   Multiple myeloma without remission [facility])   Discharge Diagnoses:  Principal Problem:   Diverticulitis s/p robotic sigmoid colectomy 07/12/2015 Active Problems:   Neuropathy ([place])   BPH (benign prostatic hyperplasia)   GERD (gastroesophageal reflux disease)   H/O ETOH abuse   Insomnia   Multiple myeloma without remission ([place])   POST-OPERATIVE DIAGNOSIS: Sigmoid diverticulitis with prior abcess   PROCEDURE:  XI ROBOT ASSISTED SIGMOID COLECTOMY ROBOTIC LYSIS OF ADHESIONS  RIGID PROCTOSCOPY  SURGEON: Michael Boston, MD  Consults: None  Hospital Course:   Patient struggling with recurrent/persistent diverticulitis with prior abscess.  Eventually calmed down to a point where felt to safely resect an attempted primary anastomosis.  The patient underwent the surgery above.  Postoperatively, the patient gradually mobilized and advanced to a solid diet.  Pain and other symptoms were treated aggressively.  Patient was happily surprised that he had very little abdominal pain.  He noted that energy level and pain level or much improved compared to before surgery.    By the time of discharge, the patient was walking well the hallways, eating food, having flatus.   Having bowel movements.  Pain was mild and well-controlled on an oral medications.  He returned to his basic chronic pain and anxiety medications.  Based on meeting discharge criteria and continuing to recover, I felt it was safe for the patient to be discharged from the hospital to further recover with close followup.  I discussed with nursing as well.  They had no concerns/objections to discharging later today.  Postoperative recommendations were discussed in detail.  They are written as well.   Significant Diagnostic Studies:  Results for orders placed or performed during the hospital encounter of 07/11/15 (from the past 72 hour(s))  Basic metabolic panel     Status: Abnormal   Collection Time: 07/12/15  4:15 AM  Result Value Ref Range   Sodium 138 135 - 145 mmol/L   Potassium 4.2 3.5 - 5.1 mmol/L   Chloride 110 101 - 111 mmol/L   CO2 23 22 - 32 mmol/L   Glucose, Bld 121 (H) 65 - 99 mg/dL   BUN 16 6 - 20 mg/dL   Creatinine, Ser 0.68 0.61 - 1.24 mg/dL   Calcium 8.2 (L) 8.9 - 10.3 mg/dL   GFR calc non Af Amer >60 >60 mL/min   GFR calc Af Amer >60 >60 mL/min    Comment: (NOTE) The eGFR has been calculated using the CKD EPI equation. This calculation has not been validated in all clinical situations. eGFR's persistently <60 mL/min signify possible Chronic Kidney Disease.    Anion gap 5 5 - 15  CBC     Status: Abnormal   Collection Time: 07/12/15  4:15 AM  Result Value Ref Range   WBC 9.2 4.0 - 10.5 K/uL   RBC 4.30 4.22 - 5.81 MIL/uL  Hemoglobin 12.1 (L) 13.0 - 17.0 g/dL   HCT 35.9 (L) 39.0 - 52.0 %   MCV 83.5 78.0 - 100.0 fL   MCH 28.1 26.0 - 34.0 pg   MCHC 33.7 30.0 - 36.0 g/dL   RDW 14.6 11.5 - 15.5 %   Platelets 132 (L) 150 - 400 K/uL  Magnesium     Status: None   Collection Time: 07/12/15  4:15 AM  Result Value Ref Range   Magnesium 2.0 1.7 - 2.4 mg/dL    No results found.  Discharge Exam: Blood pressure 119/57, pulse 61, temperature 98.6 F (37 C), temperature  source Oral, resp. rate 16, height 5' 8"  (1.727 m), weight 108.3 kg (238 lb 12.1 oz), SpO2 97 %.  General: Pt awake/alert/oriented x4 in no major acute distress.  Calm.  Smiling.  Appreciative.  Inquisitive. Eyes: PERRL, normal EOM. Sclera nonicteric Neuro: CN II-XII intact w/o focal sensory/motor deficits. Lymph: No head/neck/groin lymphadenopathy Psych:  No delerium/psychosis/paranoia HENT: Normocephalic, Mucus membranes moist.  No thrush Neck: Supple, No tracheal deviation Chest: No pain.  Good respiratory excursion. CV:  Pulses intact.  Regular rhythm MS: Normal AROM mjr joints.  No obvious deformity Abdomen: Soft, Nondistended.  Nontender.  No incarcerated hernias.  Incisions with normal healing ridges.  No cellulitis.  No hematoma.  No seroma. Ext:  SCDs BLE.  No significant edema.  No cyanosis Skin: No petechiae / purpura  Discharged Condition: good   Past Medical History  Diagnosis Date  . Neuropathy ([place])   . BPH (benign prostatic hyperplasia)   . GERD (gastroesophageal reflux disease)   . H/O ETOH abuse   . Skull lesion     Left occipital condyle  . History of radiation therapy 01/05/13-02/10/13    45 gray to left occipital condyle region  . Radiation 02/21/14-03/08/14    right posterior chest wall area 30 gray  . Radiation 04/19/14-05/02/14    lumbar spine 25 gray  . Diverticulitis 67/89/38    complicated by abscess and required percutaneous drainage  . Multiple myeloma ([place]) 2015  . History of chemotherapy last done jan 2017  . Wrist fracture, left     x 2  . Intra-abdominal abscess ([place])   . Colonic diverticular abscess 01/08/2015    Past Surgical History  Procedure Laterality Date  . Cyst excision  1959    tail bone  . Colonoscopy N/A 04/19/2015    Procedure: COLONOSCOPY;  Surgeon: Danie Binder, MD;  Location: [facility];  Service: Endoscopy;  Laterality: N/A;  1:30 PM    Social History   Social History  . Marital Status: Married    Spouse Name: N/A  .  Number of Children: 3  . Years of Education: N/A   Occupational History  .     Social History Main Topics  . Smoking status: Never Smoker   . Smokeless tobacco: Never Used  . Alcohol Use: No     Comment: " Not much no more"  . Drug Use: No  . Sexual Activity: Not on file   Other Topics Concern  . Not on file   Social History Narrative    Family History  Problem Relation Age of Onset  . Diabetes Mother   . Stroke Mother   . Hypertension Father     Current Facility-Administered Medications  Medication Dose Route Frequency Provider Last Rate Last Dose  . 0.9 %  sodium chloride infusion  250 mL Intravenous PRN Michael Boston, MD      .  acetaminophen (TYLENOL) tablet 1,000 mg  1,000 mg Oral TID Michael Boston, MD   1,000 mg at 07/12/15 2214  . ALPRAZolam Duanne Moron) tablet 1-2 mg  1-2 mg Oral QHS PRN Michael Boston, MD   1 mg at 07/12/15 2220  . alum & mag hydroxide-simeth (MAALOX/MYLANTA) 200-200-20 MG/5ML suspension 30 mL  30 mL Oral Q6H PRN Michael Boston, MD      . aspirin EC tablet 325 mg  325 mg Oral Daily Michael Boston, MD   325 mg at 07/12/15 1022  . calcium-vitamin D (OSCAL WITH D) 500-200 MG-UNIT per tablet 1 tablet  1 tablet Oral BID Michael Boston, MD   1 tablet at 07/12/15 2215  . diphenhydrAMINE (BENADRYL) 12.5 MG/5ML elixir 12.5-25 mg  12.5-25 mg Oral Q6H PRN Michael Boston, MD       Or  . diphenhydrAMINE (BENADRYL) injection 12.5 mg  12.5 mg Intravenous Q6H PRN Michael Boston, MD      . enoxaparin (LOVENOX) injection 40 mg  40 mg Subcutaneous Q24H Michael Boston, MD   40 mg at 07/12/15 3016  . EPINEPHrine (EPI-PEN) injection 0.3 mg  0.3 mg Intramuscular Once PRN Michael Boston, MD      . famotidine (PEPCID) tablet 20 mg  20 mg Oral BID Michael Boston, MD   20 mg at 07/12/15 2214  . feeding supplement (ENSURE ENLIVE) (ENSURE ENLIVE) liquid 237 mL  237 mL Oral BID BM Michael Boston, MD   237 mL at 07/12/15 1433  . gabapentin (NEURONTIN) capsule 400 mg  400 mg Oral QID Michael Boston, MD   400  mg at 07/12/15 2214  . HYDROmorphone (DILAUDID) injection 0.5-2 mg  0.5-2 mg Intravenous Q1H PRN Michael Boston, MD      . lactated ringers bolus 1,000 mL  1,000 mL Intravenous Q8H PRN Michael Boston, MD   1,000 mL at 07/12/15 1110  . lip balm (CARMEX) ointment 1 application  1 application Topical BID Michael Boston, MD   1 application at 02/11/30 2215  . magic mouthwash  15 mL Oral QID PRN Michael Boston, MD      . menthol-cetylpyridinium (CEPACOL) lozenge 3 mg  1 lozenge Oral PRN Michael Boston, MD      . methocarbamol (ROBAXIN) 1,000 mg in dextrose 5 % 50 mL IVPB  1,000 mg Intravenous Q6H PRN Michael Boston, MD      . metoprolol (LOPRESSOR) injection 5 mg  5 mg Intravenous Q6H PRN Michael Boston, MD      . multivitamins with iron tablet 1 tablet  1 tablet Oral Daily Michael Boston, MD   1 tablet at 07/12/15 1011  . oxyCODONE-acetaminophen (PERCOCET/ROXICET) 5-325 MG per tablet 1 tablet  1 tablet Oral Q4H PRN Michael Boston, MD   1 tablet at 07/12/15 1439   And  . oxyCODONE (Oxy IR/ROXICODONE) immediate release tablet 5 mg  5 mg Oral Q4H PRN Michael Boston, MD      . pantoprazole (PROTONIX) EC tablet 40 mg  40 mg Oral Daily Michael Boston, MD   40 mg at 07/12/15 1023  . phenol (CHLORASEPTIC) mouth spray 2 spray  2 spray Mouth/Throat PRN Michael Boston, MD      . prochlorperazine (COMPAZINE) injection 10 mg  10 mg Intravenous Q6H PRN Michael Boston, MD      . saccharomyces boulardii (FLORASTOR) capsule 250 mg  250 mg Oral BID Michael Boston, MD   250 mg at 07/12/15 2214  . sodium chloride flush (NS) 0.9 % injection 3 mL  3 mL  Intravenous Q12H Michael Boston, MD      . sodium chloride flush (NS) 0.9 % injection 3 mL  3 mL Intravenous PRN Michael Boston, MD      . tamsulosin [facility]) capsule 0.4 mg  0.4 mg Oral QHS Michael Boston, MD   0.4 mg at 07/12/15 2214  . zolpidem (AMBIEN) tablet 5 mg  5 mg Oral QHS PRN Michael Boston, MD       Facility-Administered Medications Ordered in Other Encounters  Medication Dose Route Frequency  Provider Last Rate Last Dose  . sodium chloride 0.9 % injection 10 mL  10 mL Intracatheter PRN Baird Cancer, PA-C   10 mL at 08/04/14 1103     Allergies  Allergen Reactions  . Codeine Other (See Comments)    Severe headache  . Morphine And Related Other (See Comments)    Makes him feel weird  . Revlimid [Lenalidomide] Other (See Comments)    "Causes me to become weak"    Disposition: 01-Home or Self Care  Discharge Instructions    Call MD for:  extreme fatigue    Complete by:  As directed      Call MD for:  hives    Complete by:  As directed      Call MD for:  persistant nausea and vomiting    Complete by:  As directed      Call MD for:  redness, tenderness, or signs of infection (pain, swelling, redness, odor or green/yellow discharge around incision site)    Complete by:  As directed      Call MD for:  severe uncontrolled pain    Complete by:  As directed      Call MD for:    Complete by:  As directed   Temperature > 101.110F     Diet - low sodium heart healthy    Complete by:  As directed   Start with bland, low residue diet for a few days, then advance to a heart healthy (low fat, high fiber) diet.  If you feel nauseated or constipated, simplify to a liquid only diet for 48 hours until you are feeling better (no more nausea, farting/passing gas, having a bowel movement, etc...).  If you cannot tolerate even drinking liquids, or feeling worse, let your surgeon know or go to the Emergency Department for help.     Discharge instructions    Complete by:  As directed   Please see discharge instruction sheets.   Also refer to any handouts/printouts that may have been given from the [facility] surgery office (if you visited [place] there before surgery) Please call our office if you have any questions or concerns [phone]     Discharge wound care:    Complete by:  As directed   If you have closed incisions: Shower and bathe over these incisions with soap and water every day.  It is  OK to wash over the dressings: they are waterproof. Remove all surgical dressings on postoperative day #3.  You do not need to replace dressings over the closed incisions unless you feel more comfortable with a Band-Aid covering it.   If you have an open wound: That requires packing, so please see wound care instructions.   In general, remove all dressings, wash wound with soap and water and then replace with saline moistened gauze.  Do the dressing change at least every day.    Please call our office [phone] if you have further questions.  Driving Restrictions    Complete by:  As directed   No driving until off narcotics and can safely swerve away without pain during an emergency     Increase activity slowly    Complete by:  As directed   Walk an hour a day.  Use 20-30 minute walks.  When you can walk 30 minutes without difficulty, it is fine to restart low impact/moderate activities such as biking, jogging, swimming, sexual activity, etc.  Eventually you can increase to unrestricted activity when not feeling pain.  If you feel pain: STOP!Marland Kitchen   Let pain protect you from overdoing it.  Use ice/heat & over-the-counter pain medications to help minimize soreness.  If that is not enough, then use your narcotic pain prescription as needed to remain active.  It is better to take extra pain medications and be more active than to stay bedridden to avoid all pain medications.     Lifting restrictions    Complete by:  As directed   Avoid heavy lifting initially, <20 pounds at first.   Do not push through pain.   You have no specific weight limit: If it hurts to do, DON'T DO IT.    If you feel no pain, you are not injuring anything.  Pain will protect you from injury.   Coughing and sneezing are far more stressful to your incision than any lifting.   Avoid resuming heavy lifting (>50 pounds) or other intense activity until off all narcotic pain medications.   When want to exercise more, give  yourself 2 weeks to gradually get back to full intense exercise/activity.     May shower / Bathe    Complete by:  As directed   [place].  It is fine for dressings or wounds to be washed/rinsed.  Use gentle soap & water.  This will help the incisions and/or wounds get clean & minimize infection.     May walk up steps    Complete by:  As directed      Sexual Activity Restrictions    Complete by:  As directed   Sexual activity as tolerated.  Do not push through pain.  Pain will protect you from injury.     Walk with assistance    Complete by:  As directed   Walk over an hour a day.  May use a walker/cane/companion to help with balance and stamina.            Medication List    TAKE these medications        ALPRAZolam 1 MG tablet  Commonly known as:  XANAX  Take 1-2 tablets (1-2 mg total) by mouth at bedtime as needed for sleep. For anxiety and/or sleep     aspirin EC 325 MG tablet  Take 1 tablet (325 mg total) by mouth daily.     Calcium-Vitamin D3 600-500 MG-UNIT Caps  Take 1 tablet by mouth 2 (two) times daily. With food     desonide 0.05 % cream  Commonly known as:  DESOWEN  Apply topically 2 (two) times daily. Approved through insurance - please resubmit     diphenhydrAMINE 25 MG tablet  Commonly known as:  BENADRYL  Take 1 tablet (25 mg total) by mouth every 4 (four) hours as needed for itching or sleep.     EPINEPHrine 0.3 mg/0.3 mL Soaj injection  Commonly known as:  EPI-PEN  Inject 0.3 mLs (0.3 mg total) into the muscle once.     famotidine 20  MG tablet  Commonly known as:  PEPCID  Take 1 tablet (20 mg total) by mouth 2 (two) times daily.     furosemide 20 MG tablet  Commonly known as:  LASIX  Take 1 tablet (20 mg total) by mouth daily.     gabapentin 400 MG capsule  Commonly known as:  NEURONTIN  Take 1 capsule (400 mg total) by mouth 4 (four) times daily.     methocarbamol 750 MG tablet  Commonly known as:  ROBAXIN  Take 1 tablet (750 mg total)  by mouth 4 (four) times daily as needed (use for muscle cramps/pain).     omeprazole 20 MG capsule  Commonly known as:  PRILOSEC  Take 1 capsule (20 mg total) by mouth daily.     oxyCODONE 5 MG immediate release tablet  Commonly known as:  Oxy IR/ROXICODONE  Take 1-2 tablets (5-10 mg total) by mouth every 6 (six) hours as needed for moderate pain, severe pain or breakthrough pain.     oxyCODONE-acetaminophen 10-325 MG tablet  Commonly known as:  PERCOCET  Take 1 tablet by mouth every 4 (four) hours as needed for pain.     tamsulosin 0.4 MG Caps capsule  Commonly known as:  FLOMAX  Take 1 capsule (0.4 mg total) by mouth at bedtime.     ZOMETA 4 MG/5ML injection  Generic drug:  zolendronic acid  Inject 4 mg into the vein every 28 (twenty-eight) days.           Follow-up Information    Schedule an appointment as soon as possible for a visit in 2 weeks to follow up.   Why:  To follow up after your operation, To follow up after your hospital stay       Signed: Morton Peters, M.D., F.A.C.S. Gastrointestinal and Minimally Invasive Surgery [facility] [address] [place], [place] [address] [phone] Main / Paging   07/13/2015, 7:37 AM

## 2015-07-13 NOTE — Progress Notes (Signed)
[facility]  [address] [place], [place] [address] Phone: [phone] FAX: [phone]   Dean [name] [id number] 07/22/1935  CARE TEAM:  PCP: Dean Fraction, MD  Outpatient Care Team: Patient Care Team: Dean Frizzle, MD as PCP - General (Family Medicine) Dean Pray, MD as Consulting Physician (Radiation Oncology) Dean Ranks, MD as Consulting Physician (Hematology and Oncology) Dean Binder, MD as Consulting Physician (Gastroenterology) Dean Boston, MD as Consulting Physician (General Surgery)  Inpatient Treatment Team: Treatment Team: Attending Provider: Michael Boston, MD; Technician: Dean [name], NT; Technician: Dean [name], NT; Student Nurse: Dean [name], Student-RN; Technician: Dean [name], NT  Problem List:   Principal Problem:   Diverticulitis s/p robotic sigmoid colectomy 07/12/2015 Active Problems:   Neuropathy ([facility])   BPH (benign prostatic hyperplasia)   GERD (gastroesophageal reflux disease)   H/O ETOH abuse   Insomnia   Multiple myeloma without remission ([place])   2 Days Post-Op  07/11/2015  POST-OPERATIVE DIAGNOSIS: Sigmoid diverticulitis with prior abcess   PROCEDURE:  XI ROBOT ASSISTED SIGMOID COLECTOMY ROBOTIC LYSIS OF ADHESIONS  RIGID PROCTOSCOPY  SURGEON: Dean Boston, MD   Assessment  Recovering  Plan:  -solid diet  -chronic pain control -anxiolysis -f/u pathology -GERD control -VTE prophylaxis- SCDs, etc -mobilize as tolerated to help recovery  D/C patient from hospital when patient meets criteria (anticipate later today):  Tolerating oral intake well Ambulating well Adequate pain control without IV medications Urinating  Having flatus Disposition planning in place   I updated the patient's status to the patient and nurse.  Recommendations were made.  Questions were answered.  They expressed understanding & appreciation.   Dean [name], M.D., F.A.C.S. Gastrointestinal and Minimally Invasive Surgery [facility] [address] [place], [place] [address] [phone] Main / Paging   07/13/2015  Subjective:  Denies pain.  Walking well.  Hungry.  Trying solids this morning.  He wants to go home.  He had many questions about activity and diet.  I discussed with him at length.  Objective:  Vital signs:  Filed Vitals:   07/12/15 1407 07/12/15 2112 07/13/15 0155 07/13/15 0600  BP: 122/69 123/56 122/50 119/57  Pulse: 72 65 70 61  Temp: 97.5 F (36.4 C) 98 F (36.7 C) 98 F (36.7 C) 98.6 F (37 C)  TempSrc: Oral Oral Oral Oral  Resp: _0 Height:      Weight:    108.3 kg (238 lb 12.1 oz)  SpO2: 98% 96% 98% 97%    Last BM Date: 07/11/15  Intake/Output   Yesterday:  06/08 0701 - 06/09 0700 In: 720 [P.O.:720] Out: 2350 [Urine:2350] This shift:     Bowel function:  Flatus: YES  BM:  YES  Drain: (No drain)   Physical Exam:  General: Pt awake/alert/oriented x4 in No acute distress Eyes: PERRL, normal EOM.  Sclera clear.  No icterus Neuro: CN II-XII intact w/o focal sensory/motor deficits. Lymph: No head/neck/groin lymphadenopathy Psych:  No delerium/psychosis/paranoia HENT: Normocephalic, Mucus membranes moist.  No thrush Neck: Supple, No tracheal deviation Chest: No chest wall pain w good excursion CV:  Pulses intact.  Regular rhythm MS: Normal AROM mjr joints.  No obvious deformity Abdomen: Soft.  Nondistended.  Nontender.  Dressings clean and dry.  I remove dressings.  I removed wicks.  No cellulitis.  No hematoma.  No seroma.  No evidence of peritonitis.  No incarcerated  hernias. Ext:  SCDs BLE.  No mjr edema.  No cyanosis Skin: No petechiae / purpura  Results:   Labs: Results for orders placed or performed during the hospital encounter of 07/11/15 (from the past 48 hour(s))  Basic metabolic panel     Status: Abnormal   Collection  Time: 07/12/15  4:15 AM  Result Value Ref Range   Sodium 138 135 - 145 mmol/L   Potassium 4.2 3.5 - 5.1 mmol/L   Chloride 110 101 - 111 mmol/L   CO2 23 22 - 32 mmol/L   Glucose, Bld 121 (H) 65 - 99 mg/dL   BUN 16 6 - 20 mg/dL   Creatinine, Ser 0.68 0.61 - 1.24 mg/dL   Calcium 8.2 (L) 8.9 - 10.3 mg/dL   GFR calc non Af Amer >60 >60 mL/min   GFR calc Af Amer >60 >60 mL/min    Comment: (NOTE) The eGFR has been calculated using the CKD EPI equation. This calculation has not been validated in all clinical situations. eGFR's persistently <60 mL/min signify possible Chronic Kidney Disease.    Anion gap 5 5 - 15  CBC     Status: Abnormal   Collection Time: 07/12/15  4:15 AM  Result Value Ref Range   WBC 9.2 4.0 - 10.5 K/uL   RBC 4.30 4.22 - 5.81 MIL/uL   Hemoglobin 12.1 (L) 13.0 - 17.0 g/dL   HCT 35.9 (L) 39.0 - 52.0 %   MCV 83.5 78.0 - 100.0 fL   MCH 28.1 26.0 - 34.0 pg   MCHC 33.7 30.0 - 36.0 g/dL   RDW 14.6 11.5 - 15.5 %   Platelets 132 (L) 150 - 400 K/uL  Magnesium     Status: None   Collection Time: 07/12/15  4:15 AM  Result Value Ref Range   Magnesium 2.0 1.7 - 2.4 mg/dL    Imaging / Studies: No results found.  Medications / Allergies: per chart  Antibiotics: Anti-infectives    Start     Dose/Rate Route Frequency Ordered Stop   07/11/15 2100  cefoTEtan (CEFOTAN) 2 g in dextrose 5 % 50 mL IVPB     2 g 100 mL/hr over 30 Minutes Intravenous Every 12 hours 07/11/15 1257 07/11/15 2227   07/11/15 1137  clindamycin (CLEOCIN) 900 mg, gentamicin (GARAMYCIN) 240 mg in sodium chloride 0.9 % 1,000 mL for intraperitoneal lavage  Status:  Discontinued       As needed 07/11/15 1137 07/11/15 1159   07/11/15 0800  clindamycin (CLEOCIN) 900 mg, gentamicin (GARAMYCIN) 240 mg in sodium chloride 0.9 % 1,000 mL for intraperitoneal lavage  Status:  Discontinued    Comments:  Pharmacy may adjust dosing strength, schedule, rate of infusion, etc as needed to optimize therapy     Intraperitoneal To Surgery 07/10/15 1341 07/11/15 1248   07/11/15 0654  cefoTEtan (CEFOTAN) 2 g in dextrose 5 % 50 mL IVPB     2 g 100 mL/hr over 30 Minutes Intravenous On call to O.R. 07/11/15 0654 07/11/15 0915        Note: Portions of this report may have been transcribed using voice recognition software. Every effort was made to ensure accuracy; however, inadvertent computerized transcription errors may be present.   Any transcriptional errors that result from this process are unintentional.     Dean [name], M.D., F.A.C.S. Gastrointestinal and Minimally Invasive Surgery [facility] [address] [place], [place] [address] [phone] Main / Paging   07/13/2015

## 2015-07-13 NOTE — Progress Notes (Signed)
Foley discontinued on July 13, 2015, post day #2 per M.D. Order. Pt due to void by 01:14pm today. Pt verbalized understanding to inform medical staff when he voids first since the foley was removed. Urinal placed at bedside along with call bell and phone in reach. Day shift nurse and nurse tech made aware of the above information.

## 2015-07-13 NOTE — Progress Notes (Signed)
Discharge instructions discussed with patient and wife, verbalized understanding and agreement, prescription given to family

## 2015-08-02 ENCOUNTER — Encounter (HOSPITAL_COMMUNITY): Payer: Medicare Other | Attending: Hematology and Oncology | Admitting: Hematology & Oncology

## 2015-08-02 ENCOUNTER — Encounter (HOSPITAL_COMMUNITY): Payer: Self-pay | Admitting: Hematology & Oncology

## 2015-08-02 VITALS — BP 116/62 | HR 67 | Temp 97.8°F | Resp 18 | Wt 229.5 lb

## 2015-08-02 DIAGNOSIS — C9000 Multiple myeloma not having achieved remission: Secondary | ICD-10-CM

## 2015-08-02 DIAGNOSIS — M542 Cervicalgia: Secondary | ICD-10-CM | POA: Insufficient documentation

## 2015-08-02 DIAGNOSIS — G4700 Insomnia, unspecified: Secondary | ICD-10-CM

## 2015-08-02 DIAGNOSIS — N400 Enlarged prostate without lower urinary tract symptoms: Secondary | ICD-10-CM | POA: Insufficient documentation

## 2015-08-02 DIAGNOSIS — K5730 Diverticulosis of large intestine without perforation or abscess without bleeding: Secondary | ICD-10-CM | POA: Diagnosis not present

## 2015-08-02 DIAGNOSIS — M899 Disorder of bone, unspecified: Secondary | ICD-10-CM | POA: Insufficient documentation

## 2015-08-02 DIAGNOSIS — R630 Anorexia: Secondary | ICD-10-CM | POA: Insufficient documentation

## 2015-08-02 DIAGNOSIS — Z923 Personal history of irradiation: Secondary | ICD-10-CM | POA: Insufficient documentation

## 2015-08-02 DIAGNOSIS — K219 Gastro-esophageal reflux disease without esophagitis: Secondary | ICD-10-CM | POA: Insufficient documentation

## 2015-08-02 DIAGNOSIS — D472 Monoclonal gammopathy: Secondary | ICD-10-CM | POA: Insufficient documentation

## 2015-08-02 DIAGNOSIS — R634 Abnormal weight loss: Secondary | ICD-10-CM | POA: Insufficient documentation

## 2015-08-02 NOTE — Progress Notes (Signed)
[name], MD [address] [place] [address]  Multiple Myeloma, IgG kappa  Left occipital condyle infiltrative lesion, not amenable to biopsy. Palliative XRT without a tissue diagnosis  Chest x-ray and right rib films were obtained which showed a soft tissue mass along the right posterior fifth rib with some rib destruction.  Monoclonal IgG kappa, normal IgG levels, suppressed IgM, abnormal kappa/lamda ratio  PET/CT  03/16/2014 Scattered hypermetabolic osseous lesions, including the calvarium, ribs, left scapula, sacrum, and left femoral shaft. An expansile left vertebral body lesion at L3 extends into the epidural space of the left lateral recess, and probably merits lumbar spine MRI assessment.  BMBX on 02/20/2014 with 33% kappa restricted plasma cells Normal FISH, normal cytogenetics  Peripheral evaluation shows very small M spike, normal CBC, normal CMP, myeloma survey grossly underestimates extent of bone involvement   Multiple myeloma without remission ([place])   11/16/2012 Initial Diagnosis L occipital condyle destructive lesion, not biopsied secondary to location. XRT   12/01/2012 Imaging L3 superior endplate compression FX, no lytic lesions to suggest myeloma   02/06/2014 Imaging soft tissue mass along the right posterior fifth rib with some rib destruction   02/16/2014 Miscellaneous Normal CBC, Normal CMP, kappa,lamda ratio of 2.62 (abnormal), UIEP with slightly restricted band, IgG kappa, SPEP/IEP with monoclonal protein at 0.79 g/dl, normal igG, suppressed IGM   02/20/2014 Bone Marrow Biopsy 33% kappa restricted plasma cells Normal FISH, normal cytogenetics   02/21/2014 - 03/08/2014 Radiation Therapy 30Gy to R rib lesion, lesion not biopsied   03/16/2014 PET scan Scattered hypermetabolic osseous lesions, including the calvarium, ribs, left scapula, sacrum, and left femoral shaft. An expansile left vertebral body lesion at L3 extends into the epidural  space of the left lateral recess,    03/17/2014 - 04/07/2014 Chemotherapy Velcade and Dexamethasone due to initial denial of Revlimid by insurance company.  Zometa monthly.   03/22/2014 Imaging MR_ L-Spine- Enhancing lesions compatible with multiple myeloma at L2, L3, and S1-2.   04/07/2014 - 07/28/2014 Chemotherapy RVD with Revlimid at 25 mg days 1-14.  Revlimid dose reduced to 25 mg every other day x 14 days with 7 day respite beginning on day 1 of cycle 2.   04/17/2014 Adverse Reaction Revlimid-induced rash.  Treated with steroids.   07/28/2014 - 08/04/2014 Chemotherapy Revlimid daily   08/04/2014 Adverse Reaction Velcade-induced peripheral neuropathy   08/04/2014 Treatment Plan Change D/C Velcade   08/11/2014 PET scan Response to therapy. Improvement and resolution of foci of osseous hypermetabolism.   08/22/2014 - 08/28/2014 Chemotherapy Revlimid 25 mg days 1-21 every 28 days   08/28/2014 Adverse Reaction Revlimid-induced rash. Medication held.  Medrol dose Pak prescribed.   09/04/2014 - 01/08/2015 Chemotherapy Revlimid 10 mg every other day, without dexamethasone   01/08/2015 - 01/15/2015 Hospital Admission Colonic diverticular abscess with IR drain placement   03/02/2015 PET scan Only bony uptake is low activity at site of deformity/callus at R second rib FX, high activity in sigmoid colon, advanced sigmoid divertidulosis. Abscess not resolved?   06/04/2015 Imaging CT nonobstructive L renal calculus, diverticulosis of descending and sigmoid colon without inflammation, moderate prostatic enlargement    CURRENT THERAPY: Zometa  INTERVAL HISTORY: Dean [name]. 80 y.o. male returns for followup of multiple myeloma, monoclonal IgG kappa with scattered osseous lesions. He has sigmoid diverticulosis and has undergone treatment for a diverticular abscess. He had recently had a robotic sigmoid colectomy on 6/8. Mr. Williard was here with  his wife today.  He confirms he made it through the surgery. He says "that was the  reason I was feeling so weak, because that infection was in there." He is really impressed with how his surgery went and that it's now over, and he doesn't need a colostomy bag.  In terms of eating and appetite, he says "I'm not eating what I want to eat," because he was told to eat greasy foods and to stay on a heart healthy diet for the rest of his life. He also isn't supposed to be eating anything with seeds. His main complaint seems to be about his eating limitations.   He asked if he could mow the yard, and was told that if he gets tired, he should just quit. He mowed part of the yard on Tuesday.  During the physical exam, he denies his neck hurting all the time. He also denies any pain while his neck is palpated. He says that when his neck hurts, it just hurts down his left side.  He notes that, in the past, when he was taking gabapentin, he was really sleeping good. Now, he thinks he's taking one neurontin in the morning, one at dinner, and one at night. He's already been advised that the neurontin is not addictive and that it is good for sleep.  He is inquiring about ongoing follow-up and evaluation of his myeloma.  No nausea, vomiting or constipation.    Past Medical History  Diagnosis Date  . Neuropathy ([place])   . BPH (benign prostatic hyperplasia)   . GERD (gastroesophageal reflux disease)   . H/O ETOH abuse   . Skull lesion     Left occipital condyle  . History of radiation therapy 01/05/13-02/10/13    45 gray to left occipital condyle region  . Radiation 02/21/14-03/08/14    right posterior chest wall area 30 gray  . Radiation 04/19/14-05/02/14    lumbar spine 25 gray  . Diverticulitis 57/26/20    complicated by abscess and required percutaneous drainage  . Multiple myeloma ([place]) 2015  . History of chemotherapy last done jan 2017  . Wrist fracture, left     x 2  . Intra-abdominal abscess ([place])   . Colonic diverticular abscess 01/08/2015    has Neuropathy ([place]); BPH (benign  prostatic hyperplasia); GERD (gastroesophageal reflux disease); H/O ETOH abuse; Skull lesion; Neck pain; Fatigue; Loss of weight; Insomnia; Allergic rhinitis; Lumbar compression fracture ([place]); Dermatitis; Multiple myeloma without remission ([place]); Thrombocytopenia ([place]); Abnormal CT scan, sigmoid colon; Abnormal PET scan of colon; Special screening for malignant neoplasms, colon; and Diverticulitis s/p robotic sigmoid colectomy 07/11/2015 on his problem list.     is allergic to codeine; morphine and related; and revlimid.  Mr. Seidman had no medications administered during this visit.  Past Surgical History  Procedure Laterality Date  . Cyst excision  1959    tail bone  . Colonoscopy N/A 04/19/2015    Procedure: COLONOSCOPY;  Surgeon: Danie Binder, MD;  Location: [facility];  Service: Endoscopy;  Laterality: N/A;  1:30 PM   Positive for abdominal pain and nervous/anxious. Abdominal pain feels like pinching in certain areas. This does not happen all the time. Nervous about upcoming surgery.  Denies any headaches, dizziness, double vision, fevers, chills, night sweats, trouble sleeping, nausea, vomiting, chest pain, heart palpitations, shortness of breath, blood in stool, black tarry stool, urinary pain, urinary burning, urinary frequency, hematuria. 14 point review of systems was performed and is negative except as detailed  under history of present illness and above   PHYSICAL EXAMINATION  ECOG PERFORMANCE STATUS: 0 - Asymptomatic   Filed Vitals:   08/02/15 1019  BP: 116/62  Pulse: 67  Temp: 97.8 F (36.6 C)  Resp: 18    GENERAL:alert, no distress, well nourished, well developed, comfortable, cooperative, obese and smiling SKIN: skin color, texture, turgor are normal, no rashes or significant lesions  HEAD: Normocephalic, No masses, lesions, tenderness or abnormalities EYES: normal, PERRLA, EOMI, Conjunctiva are pink and non-injected. Wears dark sunglasses all the time EARS:  External ears normal OROPHARYNX:lips, buccal mucosa, and tongue normal and mucous membranes are moist  NECK: supple, no adenopathy, thyroid normal size, non-tender, without nodularity, no stridor, non-tender, trachea midline LYMPH:  no palpable lymphadenopathy BREAST:not examined LUNGS: clear to auscultation  HEART: regular rate & rhythm, no murmurs and no gallops ABDOMEN:abdomen soft, non-tender, obese and normal bowel sounds BACK: Back symmetric, no curvature., No CVA tenderness EXTREMITIES:less then 2 second capillary refill, no joint deformities, effusion, or inflammation, no edema, no skin discoloration, no clubbing, no cyanosis  NEURO: alert & oriented x 3 with fluent speech, no focal motor/sensory deficits, gait normal   LABORATORY DATA: I have reviewed the labs below. CBC    Component Value Date/Time   WBC 9.2 07/12/2015 0415   RBC 4.30 07/12/2015 0415   HGB 12.1* 07/12/2015 0415   HCT 35.9* 07/12/2015 0415   PLT 132* 07/12/2015 0415   MCV 83.5 07/12/2015 0415   MCH 28.1 07/12/2015 0415   MCHC 33.7 07/12/2015 0415   RDW 14.6 07/12/2015 0415   LYMPHSABS 1.0 05/17/2015 0949   MONOABS 0.7 05/17/2015 0949   EOSABS 0.0 05/17/2015 0949   BASOSABS 0.0 05/17/2015 0949      Chemistry      Component Value Date/Time   NA 138 07/12/2015 0415   K 4.2 07/12/2015 0415   CL 110 07/12/2015 0415   CO2 23 07/12/2015 0415   BUN 16 07/12/2015 0415   CREATININE 0.68 07/12/2015 0415   CREATININE 0.68* 02/16/2015 1027      Component Value Date/Time   CALCIUM 8.2* 07/12/2015 0415   ALKPHOS 18* 07/06/2015 1020   AST 16 07/06/2015 1020   ALT 13* 07/06/2015 1020   BILITOT 0.4 07/06/2015 1020      RADIOGRAPHY: I have personally reviewed the radiological images as listed and agreed with the findings in the report.   Study Result     CLINICAL DATA: Subsequent treatment strategy for multiple myeloma.  EXAM: NUCLEAR MEDICINE PET WHOLE BODY  TECHNIQUE: 11.7 mCi F-18 FDG was  injected intravenously. Full-ring PET imaging was performed from the vertex to the feet after the radiotracer. CT data was obtained and used for attenuation correction and anatomic localization.  FASTING BLOOD GLUCOSE: Value: 79 mg/dl  COMPARISON: 12/22/2014  FINDINGS: Head/Neck: High activity in the scalp, and in the nose, likely physiologic. No adenopathy in the neck or significant worrisome bony lesions. Grossly normal intracranial activity in the brain.  Chest: There is high activity in a mid thoracic intervertebral spurring anteriorly, maximum standard uptake value 10.4, probably inflammatory given the presence of this finding in a spur.  Coronary, aortic arch, and branch vessel atherosclerotic vascular disease. There is evidence of old granulomatous disease. Scarring posteriorly in the right upper lobe similar to prior.  Abdomen/Pelvis: Sigmoid diverticulosis with high activity in the sigmoid colon. There continues to be a small band of density with a small amount of gas density anterior to the sigmoid colon, potentially  representing a tiny residual abscess or an unusual diverticulum with surrounding chronic inflammatory findings. The appearance is less striking than on the prior exam but is still visible on images 192-193 of series 4.  Prominent prostate gland, 6.0 by 5.1 cm on image 219 series 4.  Skeleton: Faintly increased uptake laterally at the deformity/callus in the right second rib, maximum standard uptake value 5.4, no change in the CT appearance. There is deformity of the right fifth rib with some expansile component but without hypermetabolic activity posterolaterally, CT abnormality shown on image 91 series for and not appreciably changed in morphology. No lesions in the visualized appendicular skeleton. No hypermetabolic activity at the L3 lesion, the right sacral lesion, or any of the other prior identified lesions.  Extremities: No  hypermetabolic activity to suggest metastasis.  IMPRESSION: 1. The only currently active bony uptake is low grade activity at the site of the deformity/callus at the right second rib fracture. This may have been a pathologic fracture originally. There is some callus formation and sclerosis along with some central lucency at this site. Maximum standard uptake value is 5.4. None of the other prior bony lesions demonstrate any significant hypermetabolic activity. 2. There is some high activity in a mid thoracic intervertebral spur which is highly likely to be inflammatory rather than neoplastic. 3. There continues to be high activity in the sigmoid colon along with fairly advanced sigmoid colon diverticulosis. If the patient has not had recent colonoscopy thin it may be prudent to ensure lack of tumor in the sigmoid colon. Unusual diverticulum or small abscess anterior to the sigmoid colon is reduced in size and prominence but not completely resolved, with a tiny amount of potentially extra locular gas detailed above.   Electronically Signed  By: Van Clines M.D.  On: 03/02/2015 15:14    No results found.   ASSESSMENT AND PLAN:  IgG kappa myeloma Multiple bone lesions seen on PET imaging RVD therapy Rash on revlimid, currently on every other day dosing Insomnia Hypocalcemia Diverticulitis/peridiverticular abscess seen on PET imaging, patient asymptomatic IR drain placement of diverticular abscess Sigmoid colon diverticulosis   He had a robotic assisted sigmoid colectomy on 07/13/2015. He will need another PET scan at the end of July, and bloodwork at that time. PET imaging has been the most reliable way to follow his disease. We will repeat his labs at return. Treatment recommendations will be made at follow-up.  He will try taking two neurontin at night, and see if that helps him sleep. If it does, he will let us know. I'll go ahead and set up his PET scan and  bloodwork and get his return appointment.  Orders Placed This Encounter  Procedures  . NM PET Image Restag (PS) Skull Base To Thigh    Standing Status: Future     Number of Occurrences:      Standing Expiration Date: 08/01/2016    Order Specific Question:  Reason for Exam (SYMPTOM  OR DIAGNOSIS REQUIRED)    Answer:  restaging myeloma    Order Specific Question:  Preferred imaging location?    Answer:  [facility]    Order Specific Question:  If indicated for the ordered procedure, I authorize the administration of a radiopharmaceutical per Radiology protocol    Answer:  Yes  . CBC with Differential    Standing Status: Future     Number of Occurrences:      Standing Expiration Date: 08/01/2016  . Comprehensive metabolic panel  Standing Status: Future     Number of Occurrences:      Standing Expiration Date: 08/01/2016  . Immunofixation electrophoresis    Standing Status: Future     Number of Occurrences:      Standing Expiration Date: 08/01/2016  . Protein electrophoresis, serum    Standing Status: Future     Number of Occurrences:      Standing Expiration Date: 08/01/2016  . UPEP/TP, 24-Hr Urine    Standing Status: Future     Number of Occurrences:      Standing Expiration Date: 08/01/2016  . Immunofixation, urine    Standing Status: Future     Number of Occurrences:      Standing Expiration Date: 08/01/2016  . Kappa/lambda light chains    Standing Status: Future     Number of Occurrences:      Standing Expiration Date: 08/01/2016   All questions were answered. The patient knows to call the clinic with any problems, questions or concerns. We can certainly see the patient much sooner if necessary.   This note is electronically signed.  This document serves as a record of services personally performed by Ancil Linsey, MD. It was created on her behalf by Toni Amend, a trained medical scribe. The creation of this record is based on the scribe's personal  observations and the provider's statements to them. This document has been checked and approved by the attending provider.  I have reviewed the above documentation for accuracy and completeness, and I agree with the above.  Kelby Fam. Penland MD

## 2015-08-02 NOTE — Patient Instructions (Signed)
[place] at [facility] [place] Discharge Instructions  RECOMMENDATIONS MADE BY THE CONSULTANT AND ANY TEST RESULTS WILL BE SENT TO YOUR REFERRING PHYSICIAN.  Exam done and seen today by Dr. Whitney Muse Pet scan scheduled Labs after your scan Return to see the doctor after your scan Please call the clinic if you have any questions or concerns  Thank you for choosing [place] at [facility] to provide your oncology and hematology care.  To afford each patient quality time with our provider, please arrive at least 15 minutes before your scheduled appointment time.   Beginning January 23rd 2017 lab work for the [facility] will be done in the  Main lab at [facility] on 1st floor. If you have a lab appointment with the [facility] please come in thru the  Main Entrance and check in at the main information desk  You need to re-schedule your appointment should you arrive 10 or more minutes late.  We strive to give you quality time with our providers, and arriving late affects you and other patients whose appointments are after yours.  Also, if you no show three or more times for appointments you may be dismissed from the clinic at the providers discretion.     Again, thank you for choosing [facility].  Our hope is that these requests will decrease the amount of time that you wait before being seen by our physicians.       _____________________________________________________________  Should you have questions after your visit to [facility], please contact our office at [phone] between the hours of 8:30 a.m. and 4:30 p.m.  Voicemails left after 4:30 p.m. will not be returned until the following business day.  For prescription refill requests, have your pharmacy contact our office.         Resources For Cancer Patients and their Caregivers ? American Cancer Society: Can assist with transportation, wigs, general needs,  runs Look Good Feel Better.        (805)012-0981 ? Cancer Care: Provides financial assistance, online support groups, medication/co-pay assistance.  1-800-813-HOPE 712-631-5282) ? [place] Assists [place] cancer patients and their families through emotional , educational and financial support.  (920)090-1503 ? [place] DSS Where to apply for food stamps, Medicaid and utility assistance. 252-441-5078 ? [employer]ATS: Transportation to medical appointments. 914-365-0428 ? Social Security Administration: May apply for disability if have a Stage IV cancer. 317 415 4680 (407) 660-1271 ? [employer], Disability and Transit Services: Assists with nutrition, care and transit needs. [place] Support Programs: @10RELATIVEDAYS @ > Cancer Support Group  2nd Tuesday of the month 1pm-2pm, Journey Room  > Creative Journey  3rd Tuesday of the month 1130am-1pm, Journey Room  > Look Good Feel Better  1st Wednesday of the month 10am-12 noon, Journey Room (Call [place] to register [phone])

## 2015-08-19 ENCOUNTER — Encounter (HOSPITAL_COMMUNITY): Payer: Self-pay | Admitting: Hematology & Oncology

## 2015-08-22 ENCOUNTER — Other Ambulatory Visit (HOSPITAL_COMMUNITY): Payer: Self-pay | Admitting: Oncology

## 2015-08-22 DIAGNOSIS — C9000 Multiple myeloma not having achieved remission: Secondary | ICD-10-CM

## 2015-08-22 DIAGNOSIS — G629 Polyneuropathy, unspecified: Secondary | ICD-10-CM

## 2015-08-22 MED ORDER — GABAPENTIN 400 MG PO CAPS: 2000.0000 mg | ORAL_CAPSULE | Freq: Every day | ORAL | Status: DC

## 2015-08-23 ENCOUNTER — Ambulatory Visit (HOSPITAL_COMMUNITY)
Admission: RE | Admit: 2015-08-23 | Discharge: 2015-08-23 | Disposition: A | Discharge status: Home / Self Care | Payer: Medicare Other | Source: Ambulatory Visit | Attending: Hematology & Oncology | Admitting: Hematology & Oncology

## 2015-08-23 ENCOUNTER — Other Ambulatory Visit (HOSPITAL_COMMUNITY): Payer: Self-pay | Admitting: Oncology

## 2015-08-23 ENCOUNTER — Encounter (HOSPITAL_COMMUNITY)
Admission: RE | Admit: 2015-08-23 | Discharge: 2015-08-23 | Disposition: A | Discharge status: Home / Self Care | Payer: Medicare Other | Source: Ambulatory Visit | Attending: Hematology & Oncology | Admitting: Hematology & Oncology

## 2015-08-23 DIAGNOSIS — C9000 Multiple myeloma not having achieved remission: Secondary | ICD-10-CM

## 2015-08-23 DIAGNOSIS — R937 Abnormal findings on diagnostic imaging of other parts of musculoskeletal system: Secondary | ICD-10-CM | POA: Insufficient documentation

## 2015-08-23 DIAGNOSIS — Z8579 Personal history of other malignant neoplasms of lymphoid, hematopoietic and related tissues: Secondary | ICD-10-CM | POA: Diagnosis not present

## 2015-08-23 LAB — GLUCOSE, CAPILLARY: Glucose-Capillary: 101 mg/dL — ABNORMAL HIGH (ref 65–99)

## 2015-08-23 MED ORDER — FLUDEOXYGLUCOSE F - 18 (FDG) INJECTION
11.3300 | Freq: Once | INTRAVENOUS | Status: AC | PRN
  Administered 2015-08-23: 11.33 via INTRAVENOUS

## 2015-08-24 ENCOUNTER — Encounter (HOSPITAL_COMMUNITY): Payer: Medicare Other | Attending: Hematology and Oncology

## 2015-08-24 DIAGNOSIS — R634 Abnormal weight loss: Secondary | ICD-10-CM | POA: Insufficient documentation

## 2015-08-24 DIAGNOSIS — M899 Disorder of bone, unspecified: Secondary | ICD-10-CM | POA: Diagnosis not present

## 2015-08-24 DIAGNOSIS — Z923 Personal history of irradiation: Secondary | ICD-10-CM | POA: Insufficient documentation

## 2015-08-24 DIAGNOSIS — M542 Cervicalgia: Secondary | ICD-10-CM | POA: Diagnosis not present

## 2015-08-24 DIAGNOSIS — D472 Monoclonal gammopathy: Secondary | ICD-10-CM | POA: Insufficient documentation

## 2015-08-24 DIAGNOSIS — R630 Anorexia: Secondary | ICD-10-CM | POA: Diagnosis not present

## 2015-08-24 DIAGNOSIS — K219 Gastro-esophageal reflux disease without esophagitis: Secondary | ICD-10-CM | POA: Insufficient documentation

## 2015-08-24 DIAGNOSIS — C9000 Multiple myeloma not having achieved remission: Secondary | ICD-10-CM

## 2015-08-24 DIAGNOSIS — N400 Enlarged prostate without lower urinary tract symptoms: Secondary | ICD-10-CM | POA: Diagnosis not present

## 2015-08-24 LAB — CBC WITH DIFFERENTIAL/PLATELET
Basophils Absolute: 0 10*3/uL (ref 0.0–0.1)
Basophils Relative: 0 %
Eosinophils Absolute: 0 10*3/uL (ref 0.0–0.7)
Eosinophils Relative: 1 %
HCT: 42.2 % (ref 39.0–52.0)
Hemoglobin: 13.9 g/dL (ref 13.0–17.0)
Lymphocytes Relative: 29 %
Lymphs Abs: 1.6 10*3/uL (ref 0.7–4.0)
MCH: 28.7 pg (ref 26.0–34.0)
MCHC: 32.9 g/dL (ref 30.0–36.0)
MCV: 87.2 fL (ref 78.0–100.0)
Monocytes Absolute: 0.6 10*3/uL (ref 0.1–1.0)
Monocytes Relative: 11 %
Neutro Abs: 3.3 10*3/uL (ref 1.7–7.7)
Neutrophils Relative %: 59 %
Platelets: 149 10*3/uL — ABNORMAL LOW (ref 150–400)
RBC: 4.84 MIL/uL (ref 4.22–5.81)
RDW: 14.3 % (ref 11.5–15.5)
WBC: 5.5 10*3/uL (ref 4.0–10.5)

## 2015-08-24 LAB — COMPREHENSIVE METABOLIC PANEL
ALT: 16 U/L — ABNORMAL LOW (ref 17–63)
AST: 18 U/L (ref 15–41)
Albumin: 3.8 g/dL (ref 3.5–5.0)
Alkaline Phosphatase: 24 U/L — ABNORMAL LOW (ref 38–126)
Anion gap: 6 (ref 5–15)
BUN: 19 mg/dL (ref 6–20)
CO2: 25 mmol/L (ref 22–32)
Calcium: 8.3 mg/dL — ABNORMAL LOW (ref 8.9–10.3)
Chloride: 105 mmol/L (ref 101–111)
Creatinine, Ser: 0.69 mg/dL (ref 0.61–1.24)
GFR calc Af Amer: >60 mL/min (ref >60)
GFR calc non Af Amer: >60 mL/min (ref >60)
Glucose, Bld: 99 mg/dL (ref 65–99)
Potassium: 3.9 mmol/L (ref 3.5–5.1)
Sodium: 136 mmol/L (ref 135–145)
Total Bilirubin: 0.8 mg/dL (ref 0.3–1.2)
Total Protein: 6.5 g/dL (ref 6.5–8.1)

## 2015-08-27 ENCOUNTER — Other Ambulatory Visit (HOSPITAL_COMMUNITY): Payer: Medicare Other

## 2015-08-27 LAB — PROTEIN ELECTROPHORESIS, SERUM
A/G Ratio: 1.4 (ref 0.7–1.7)
Albumin ELP: 3.5 g/dL (ref 2.9–4.4)
Alpha-1-Globulin: 0.2 g/dL (ref 0.0–0.4)
Alpha-2-Globulin: 0.8 g/dL (ref 0.4–1.0)
Beta Globulin: 1 g/dL (ref 0.7–1.3)
Gamma Globulin: 0.6 g/dL (ref 0.4–1.8)
Globulin, Total: 2.5 g/dL (ref 2.2–3.9)
M-Spike, %: 0.2 g/dL — ABNORMAL HIGH
Total Protein ELP: 6 g/dL (ref 6.0–8.5)

## 2015-08-27 LAB — IMMUNOFIXATION ELECTROPHORESIS
IgA: 108 mg/dL (ref 61–437)
IgG (Immunoglobin G), Serum: 604 mg/dL — ABNORMAL LOW (ref 700–1600)
IgM, Serum: 47 mg/dL (ref 15–143)
Total Protein ELP: 6 g/dL (ref 6.0–8.5)

## 2015-08-28 ENCOUNTER — Encounter (HOSPITAL_COMMUNITY): Payer: Self-pay | Admitting: Hematology & Oncology

## 2015-08-28 ENCOUNTER — Encounter (HOSPITAL_BASED_OUTPATIENT_CLINIC_OR_DEPARTMENT_OTHER): Payer: Medicare Other | Admitting: Hematology & Oncology

## 2015-08-28 VITALS — BP 129/69 | HR 75 | Temp 97.5°F | Resp 18 | Wt 234.5 lb

## 2015-08-28 DIAGNOSIS — K5732 Diverticulitis of large intestine without perforation or abscess without bleeding: Secondary | ICD-10-CM | POA: Diagnosis not present

## 2015-08-28 DIAGNOSIS — G4700 Insomnia, unspecified: Secondary | ICD-10-CM | POA: Diagnosis not present

## 2015-08-28 DIAGNOSIS — C9000 Multiple myeloma not having achieved remission: Secondary | ICD-10-CM | POA: Diagnosis not present

## 2015-08-28 LAB — KAPPA/LAMBDA LIGHT CHAINS
Kappa free light chain: 18.8 mg/L (ref 3.3–19.4)
Kappa, lambda light chain ratio: 1.3 (ref 0.26–1.65)
Lambda free light chains: 14.5 mg/L (ref 5.7–26.3)

## 2015-08-28 LAB — UPEP/TP, 24-HR URINE

## 2015-08-28 NOTE — Patient Instructions (Signed)
[place] at [facility] Discharge Instructions  RECOMMENDATIONS MADE BY THE CONSULTANT AND ANY TEST RESULTS WILL BE SENT TO YOUR REFERRING PHYSICIAN.  You were seen by Dr. Whitney Muse today. Return to Clinic in 4 weeks to see Dr.Penland or [name]  Please contact the clinic with any related concerns  Thank you for choosing [place] at [facility] to provide your oncology and hematology care.  To afford each patient quality time with our provider, please arrive at least 15 minutes before your scheduled appointment time.   Beginning January 23rd 2017 lab work for the [facility] will be done in the  Main lab at [facility] on 1st floor. If you have a lab appointment with the [facility] please come in thru the  Main Entrance and check in at the main information desk  You need to re-schedule your appointment should you arrive 10 or more minutes late.  We strive to give you quality time with our providers, and arriving late affects you and other patients whose appointments are after yours.  Also, if you no show three or more times for appointments you may be dismissed from the clinic at the providers discretion.     Again, thank you for choosing [facility].  Our hope is that these requests will decrease the amount of time that you wait before being seen by our physicians.       _____________________________________________________________  Should you have questions after your visit to [facility], please contact our office at [phone] between the hours of 8:30 a.m. and 4:30 p.m.  Voicemails left after 4:30 p.m. will not be returned until the following business day.  For prescription refill requests, have your pharmacy contact our office.         Resources For Cancer Patients and their Caregivers ? American Cancer Society: Can assist with transportation, wigs, general needs, runs Look Good Feel Better.         701-140-9410 ? Cancer Care: Provides financial assistance, online support groups, medication/co-pay assistance.  1-800-813-HOPE 949-776-0476) ? [place] Assists [place] cancer patients and their families through emotional , educational and financial support.  (770)816-5333 ? [place] DSS Where to apply for food stamps, Medicaid and utility assistance. 208-225-7193 ? RCATS: Transportation to medical appointments. (845) 247-4966 ? Social Security Administration: May apply for disability if have a Stage IV cancer. 670-834-2774 670-285-7514 ? [employer], Disability and Transit Services: Assists with nutrition, care and transit needs. [place] Support Programs: @10RELATIVEDAYS @ > Cancer Support Group  2nd Tuesday of the month 1pm-2pm, Journey Room  > Creative Journey  3rd Tuesday of the month 1130am-1pm, Journey Room  > Look Good Feel Better  1st Wednesday of the month 10am-12 noon, Journey Room (Call [place] to register [phone])

## 2015-08-28 NOTE — Progress Notes (Signed)
Dean Fraction, MD [address] [name] [address]  Multiple Myeloma, IgG kappa  Left occipital condyle infiltrative lesion, not amenable to biopsy. Palliative XRT without a tissue diagnosis  Chest x-ray and right rib films were obtained which showed a soft tissue mass along the right posterior fifth rib with some rib destruction.  Monoclonal IgG kappa, normal IgG levels, suppressed IgM, abnormal kappa/lamda ratio  PET/CT  03/16/2014 Scattered hypermetabolic osseous lesions, including the calvarium, ribs, left scapula, sacrum, and left femoral shaft. An expansile left vertebral body lesion at L3 extends into the epidural space of the left lateral recess, and probably merits lumbar spine MRI assessment.  BMBX on 02/20/2014 with 33% kappa restricted plasma cells Normal FISH, normal cytogenetics  Peripheral evaluation shows very small M spike, normal CBC, normal CMP, myeloma survey grossly underestimates extent of bone involvement   Multiple myeloma without remission ([place])   11/16/2012 Initial Diagnosis    L occipital condyle destructive lesion, not biopsied secondary to location. XRT     12/01/2012 Imaging    L3 superior endplate compression FX, no lytic lesions to suggest myeloma     02/06/2014 Imaging    soft tissue mass along the right posterior fifth rib with some rib destruction     02/16/2014 Miscellaneous    Normal CBC, Normal CMP, kappa,lamda ratio of 2.62 (abnormal), UIEP with slightly restricted band, IgG kappa, SPEP/IEP with monoclonal protein at 0.79 g/dl, normal igG, suppressed IGM     02/20/2014 Bone Marrow Biopsy    33% kappa restricted plasma cells Normal FISH, normal cytogenetics     02/21/2014 - 03/08/2014 Radiation Therapy    30Gy to R rib lesion, lesion not biopsied     03/16/2014 PET scan    Scattered hypermetabolic osseous lesions, including the calvarium, ribs, left scapula, sacrum, and left femoral shaft. An expansile left vertebral  body lesion at L3 extends into the epidural space of the left lateral recess,      03/17/2014 - 04/07/2014 Chemotherapy    Velcade and Dexamethasone due to initial denial of Revlimid by insurance company.  Zometa monthly.     03/22/2014 Imaging    MR_ L-Spine- Enhancing lesions compatible with multiple myeloma at L2, L3, and S1-2.     04/07/2014 - 07/28/2014 Chemotherapy    RVD with Revlimid at 25 mg days 1-14.  Revlimid dose reduced to 25 mg every other day x 14 days with 7 day respite beginning on day 1 of cycle 2.     04/17/2014 Adverse Reaction    Revlimid-induced rash.  Treated with steroids.     07/28/2014 - 08/04/2014 Chemotherapy    Revlimid daily     08/04/2014 Adverse Reaction    Velcade-induced peripheral neuropathy     08/04/2014 Treatment Plan Change    D/C Velcade     08/11/2014 PET scan    Response to therapy. Improvement and resolution of foci of osseous hypermetabolism.     08/22/2014 - 08/28/2014 Chemotherapy    Revlimid 25 mg days 1-21 every 28 days     08/28/2014 Adverse Reaction    Revlimid-induced rash. Medication held.  Medrol dose Pak prescribed.     09/04/2014 - 01/08/2015 Chemotherapy    Revlimid 10 mg every other day, without dexamethasone     01/08/2015 - 01/15/2015 Hospital Admission    Colonic diverticular abscess with IR drain placement     03/02/2015 PET scan    Only bony uptake is  low activity at site of deformity/callus at R second rib FX, high activity in sigmoid colon, advanced sigmoid divertidulosis. Abscess not resolved?     06/04/2015 Imaging    CT nonobstructive L renal calculus, diverticulosis of descending and sigmoid colon without inflammation, moderate prostatic enlargement     07/12/2015 Surgery    Diverticulitis s/p robotic sigmoid colectomy with Dr. Johney Maine      CURRENT THERAPY: Zometa  INTERVAL HISTORY: Dean [name]. 80 y.o. male returns for followup of multiple myeloma, monoclonal IgG kappa with scattered osseous lesions. He has sigmoid  diverticulosis and has undergone treatment for a diverticular abscess. He had recently had a robotic sigmoid colectomy on 6/8. Dean [name] was here with his wife today.  Dean [name] is accompanied by his wife. I personally reviewed and went over PET and laboratory results with the patient.  For a while after "having the 15 inches removed from his colon", the patient was not very hungry. He notes he is not supposed to eat many of the things he enjoys. He was told not to eat anything with seeds in it.  He feels pretty good other than his arthritis. He has arthritis in his neck, shoulders, and knees. He has been mowing his yard, further stating "If I get to hurting, I quit and go in the house".   He is agreeable to starting on therapy for myeloma if needed. He comments several times that he never expected to live to be 80.    Past Medical History:  Diagnosis Date  . BPH (benign prostatic hyperplasia)   . Colonic diverticular abscess 01/08/2015  . Diverticulitis 17/79/39   complicated by abscess and required percutaneous drainage  . GERD (gastroesophageal reflux disease)   . H/O ETOH abuse   . History of chemotherapy last done jan 2017  . History of radiation therapy 01/05/13-02/10/13   45 gray to left occipital condyle region  . Intra-abdominal abscess ([place])   . Multiple myeloma ([place]) 2015  . Neuropathy ([place])   . Radiation 02/21/14-03/08/14   right posterior chest wall area 30 gray  . Radiation 04/19/14-05/02/14   lumbar spine 25 gray  . Skull lesion    Left occipital condyle  . Wrist fracture, left    x 2    has Neuropathy ([facility]); BPH (benign prostatic hyperplasia); GERD (gastroesophageal reflux disease); H/O ETOH abuse; Skull lesion; Neck pain; Fatigue; Loss of weight; Insomnia; Allergic rhinitis; Lumbar compression fracture ([place]); Dermatitis; Multiple myeloma without remission ([place]); Thrombocytopenia ([place]); Abnormal CT scan, sigmoid colon; Abnormal PET scan of colon; Special screening for  malignant neoplasms, colon; and Diverticulitis s/p robotic sigmoid colectomy 07/11/2015 on his problem list.     is allergic to codeine; morphine and related; and revlimid [lenalidomide].  Dean [name] had no medications administered during this visit.  Past Surgical History:  Procedure Laterality Date  . COLONOSCOPY N/A 04/19/2015   Procedure: COLONOSCOPY;  Surgeon: Danie Binder, MD;  Location: [facility];  Service: Endoscopy;  Laterality: N/A;  1:30 PM  . CYST EXCISION  1959   tail bone    Denies any headaches, dizziness, double vision, fevers, chills, night sweats, trouble sleeping, nausea, vomiting, chest pain, heart palpitations, shortness of breath, blood in stool, black tarry stool, urinary pain, urinary burning, urinary frequency, hematuria. Positive for joint pain. Arthritis in neck, shoulders, and knees.   14 point review of systems was performed and is negative except as detailed under history of present illness and above   PHYSICAL EXAMINATION  ECOG PERFORMANCE STATUS: 0 - Asymptomatic   Vitals:   08/28/15 0900  BP: 129/69  Pulse: 75  Resp: 18  Temp: 97.5 F (36.4 C)    GENERAL:alert, no distress, well nourished, well developed, comfortable, cooperative, obese and smiling SKIN: skin color, texture, turgor are normal, no rashes or significant lesions  HEAD: Normocephalic, No masses, lesions, tenderness or abnormalities EYES: normal, PERRLA, EOMI, Conjunctiva are pink and non-injected. Wears dark sunglasses all the time EARS: External ears normal OROPHARYNX:lips, buccal mucosa, and tongue normal and mucous membranes are moist  NECK: supple, no adenopathy, thyroid normal size, non-tender, without nodularity, no stridor, non-tender, trachea midline LYMPH:  no palpable lymphadenopathy BREAST:not examined LUNGS: clear to auscultation  HEART: regular rate & rhythm, no murmurs and no gallops ABDOMEN:abdomen soft, non-tender, obese and normal bowel sounds BACK: Back  symmetric, no curvature., No CVA tenderness EXTREMITIES:less then 2 second capillary refill, no joint deformities, effusion, or inflammation, no edema, no skin discoloration, no clubbing, no cyanosis  NEURO: alert & oriented x 3 with fluent speech, no focal motor/sensory deficits, gait normal   LABORATORY DATA: I have reviewed the labs below. CBC    Component Value Date/Time   WBC 5.5 08/24/2015 1148   RBC 4.84 08/24/2015 1148   HGB 13.9 08/24/2015 1148   HCT 42.2 08/24/2015 1148   PLT 149 (L) 08/24/2015 1148   MCV 87.2 08/24/2015 1148   MCH 28.7 08/24/2015 1148   MCHC 32.9 08/24/2015 1148   RDW 14.3 08/24/2015 1148   LYMPHSABS 1.6 08/24/2015 1148   MONOABS 0.6 08/24/2015 1148   EOSABS 0.0 08/24/2015 1148   BASOSABS 0.0 08/24/2015 1148      Chemistry      Component Value Date/Time   NA 136 08/24/2015 1148   K 3.9 08/24/2015 1148   CL 105 08/24/2015 1148   CO2 25 08/24/2015 1148   BUN 19 08/24/2015 1148   CREATININE 0.69 08/24/2015 1148   CREATININE 0.68 (L) 02/16/2015 1027      Component Value Date/Time   CALCIUM 8.3 (L) 08/24/2015 1148   ALKPHOS 24 (L) 08/24/2015 1148   AST 18 08/24/2015 1148   ALT 16 (L) 08/24/2015 1148   BILITOT 0.8 08/24/2015 1148          RADIOGRAPHY: I have personally reviewed the radiological images as listed and agreed with the findings in the report.  Nm Pet Image Restage (ps) Whole Body  Result Date: 08/23/2015 CLINICAL DATA:  Subsequent Treatment strategy for history of multiple myeloma. Colonic resection 08/12/2015. Ethanol abuse. EXAM: NUCLEAR MEDICINE PET WHOLE BODY TECHNIQUE: 11.3 mCi F-18 FDG was injected intravenously. Full-ring PET imaging was performed from the vertex to the feet after the radiotracer. CT data was obtained and used for attenuation correction and anatomic localization. FASTING BLOOD GLUCOSE:  Value:  101 mg/dl COMPARISON:  03/02/2015. Abdominal pelvic CT of 06/04/2015 is also reviewed. FINDINGS: NECK No areas of  abnormal hypermetabolism. CHEST No areas of abnormal hypermetabolism. ABDOMEN/PELVIS New hypermetabolism and subcutaneous thickening about the right upper pelvis, including on image 198/ series 3. This measures a S.U.V. max of 5.4 and likely postoperative. No well-defined fluid collection. Edema within high left pelvis corresponding to hypermetabolism. This measures a S.U.V. max of 5.1, including on image 196/series 3. Also likely postoperative, given interval rectosigmoid colonic resection and primary reanastomosis. SKELETON Right second rib hypermetabolism with chronic deformity. This measures a S.U.V. max of 3.9 today versus a S.U.V. max of 5.4 previously. Expansile deformity within the fifth posterior lateral  right rib is similar on CT, measuring 3.6 cm. Hypermetabolism at a S.U.V. max of 4.0 today versus a S.U.V. max of 3.5 previously Anterior left third rib hypermetabolism with subtle callus deposition. This measures a S.U.V. max of 4.7 on image 109/series 3. This findings are new since the prior. Hypermetabolism about a anterior osteophyte at approximately T8 level measures a S.U.V. max of 7.1 today versus 10.4 previously, and is favored to be degenerative. Incidental note is made of hypermetabolism about the radial aspect of the left hand, possibly the base of the thumb. Suboptimally evaluated, including measuring a S.U.V. max of 5.3. Likely new. CT IMAGES PERFORMED FOR ATTENUATION CORRECTION expected cerebral volume loss for age. Bilateral carotid atherosclerosis. No cervical adenopathy. Mild cardiomegaly with multivessel coronary artery atherosclerosis. No thoracic adenopathy. Presumed scarring in the posterior right upper lobe, similar. Bilateral low-density renal lesions which are likely cysts. Punctate left renal collecting system calculus. Caudate lobe prominence, without specific evidence of cirrhosis. Left renal sinus cysts. Extensive residual colonic diverticulosis. Moderate prostatomegaly. No  abdominopelvic adenopathy. Mild L4 compression deformity is chronic. IMPRESSION: 1. primarily similar hypermetabolic osseous foci within the right-sided ribs. A new left third rib focus of hypermetabolism and sclerosis is favored to be related to healing fracture. 2. No soft tissue myeloma identified. 3. Hypermetabolism about the radial aspect of the left side of the hand is suboptimally evaluated. Consider physical exam correlation. Possibly related to trauma. 4. Presumed postoperative hypermetabolism and edema about the right pelvic wall and intraperitoneal left pelvis. Electronically Signed   By: Abigail Miyamoto M.D.   On: 08/23/2015 12:31     ASSESSMENT AND PLAN:  IgG kappa myeloma Multiple bone lesions seen on PET imaging RVD therapy Rash on revlimid, currently on every other day dosing Insomnia Hypocalcemia Diverticulitis/peridiverticular abscess seen on PET imaging, patient asymptomatic IR drain placement of diverticular abscess Sigmoid colon diverticulosis   He had a robotic assisted sigmoid colectomy on 07/13/2015. He is recovered and doing well  PET was reviewed in detail.  M spike is detectable. We discussed the need to restart therapy.  We could consider trying him back on revlimid again. He was taking it 10 mg qod.   I advised him I would call with a plan in the next few days. In the interim he will continue on calcium, vitamin D and zometa.   He will return for medication teaching and tentative follow up scheduled in 4 weeks.   All questions were answered. The patient knows to call the clinic with any problems, questions or concerns. We can certainly see the patient much sooner if necessary.   This note is electronically signed.  This document serves as a record of services personally performed by Ancil Linsey, MD. It was created on her behalf by Arlyce Harman, a trained medical scribe. The creation of this record is based on the scribe's personal observations and the  provider's statements to them. This document has been checked and approved by the attending provider.  I have reviewed the above documentation for accuracy and completeness, and I agree with the above.  Kelby Fam. Penland MD

## 2015-09-28 ENCOUNTER — Encounter (HOSPITAL_COMMUNITY): Payer: Self-pay | Admitting: Oncology

## 2015-09-28 ENCOUNTER — Encounter (HOSPITAL_COMMUNITY): Payer: Medicare Other | Attending: Hematology and Oncology | Admitting: Oncology

## 2015-09-28 DIAGNOSIS — R630 Anorexia: Secondary | ICD-10-CM | POA: Insufficient documentation

## 2015-09-28 DIAGNOSIS — N400 Enlarged prostate without lower urinary tract symptoms: Secondary | ICD-10-CM | POA: Insufficient documentation

## 2015-09-28 DIAGNOSIS — Z923 Personal history of irradiation: Secondary | ICD-10-CM | POA: Insufficient documentation

## 2015-09-28 DIAGNOSIS — M542 Cervicalgia: Secondary | ICD-10-CM | POA: Insufficient documentation

## 2015-09-28 DIAGNOSIS — K219 Gastro-esophageal reflux disease without esophagitis: Secondary | ICD-10-CM | POA: Insufficient documentation

## 2015-09-28 DIAGNOSIS — R634 Abnormal weight loss: Secondary | ICD-10-CM | POA: Insufficient documentation

## 2015-09-28 DIAGNOSIS — D472 Monoclonal gammopathy: Secondary | ICD-10-CM | POA: Insufficient documentation

## 2015-09-28 DIAGNOSIS — C9000 Multiple myeloma not having achieved remission: Secondary | ICD-10-CM | POA: Diagnosis not present

## 2015-09-28 DIAGNOSIS — M899 Disorder of bone, unspecified: Secondary | ICD-10-CM | POA: Insufficient documentation

## 2015-09-28 MED ORDER — POMALIDOMIDE 4 MG PO CAPS: 4.0000 mg | ORAL_CAPSULE | Freq: Every day | ORAL | 1 refills | Status: DC

## 2015-09-28 MED ORDER — DEXAMETHASONE 4 MG PO TABS: 40.0000 mg | ORAL_TABLET | ORAL | 2 refills | Status: DC

## 2015-09-28 NOTE — Assessment & Plan Note (Addendum)
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  Treated with RVD (03/17/2014- 07/28/2014) with transition to Rd complicated by rash and intolerance with patient reported fatigue.  Now with a detectable M-spike and PET avid involvement in right ribs.  Oncology history is updated.  Labs from July 2017 are reviewed.  I personally reviewed and went over laboratory results with the patient.  The results are noted within this dictation.  Labs in 2-3 weeks: CBC diff, CMET, SPEP+IFE, light chain assay, IgG, IgA, IgM, B2M.   Zometa will be provided if Ca++ is WNL.  Since he does have PET avid disease and now a detectable M-spike, he requires therapy.  I have discussed treatment options and recommended Pomalyst 4 mg PO days 1-21 every 28 days and Dexamethasone 40 mg weekly.  I have reviewed the risks, benefits, alternatives, and side effects of therapy.  I have printed prescription for Pomalyst and given to Angie.  He will be referred to chemotherapy teaching.  Message is sent to [facility] and [facility].    He is printed patient information to review for Pomalyst and these are provided to him.  He will return in ~ 2-3 weeks for follow-up.

## 2015-09-28 NOTE — Patient Instructions (Signed)
[place] at [facility] Discharge Instructions  RECOMMENDATIONS MADE BY THE CONSULTANT AND ANY TEST RESULTS WILL BE SENT TO YOUR REFERRING PHYSICIAN.  You saw Dean Crigler PA-C today.  No labs were done today.  Dean [name] is referring you for chemo teaching in 2 weeks.  Follow up in 2-3 weeks with Dean [name]. Dean [name] also wants you to start two new oral Chemo medications, Pomalyst and Dexamethasone.   Thank you for choosing [place] at [facility] to provide your oncology and hematology care.  To afford each patient quality time with our provider, please arrive at least 15 minutes before your scheduled appointment time.   Beginning January 23rd 2017 lab work for the [facility] will be done in the  Main lab at [facility] on 1st floor. If you have a lab appointment with the [facility] please come in thru the  Main Entrance and check in at the main information desk  You need to re-schedule your appointment should you arrive 10 or more minutes late.  We strive to give you quality time with our providers, and arriving late affects you and other patients whose appointments are after yours.  Also, if you no show three or more times for appointments you may be dismissed from the clinic at the providers discretion.     Again, thank you for choosing [facility].  Our hope is that these requests will decrease the amount of time that you wait before being seen by our physicians.       _____________________________________________________________  Should you have questions after your visit to [facility], please contact our office at [phone] between the hours of 8:30 a.m. and 4:30 p.m.  Voicemails left after 4:30 p.m. will not be returned until the following business day.  For prescription refill requests, have your pharmacy contact our office.         Resources For Cancer Patients and their Caregivers ? American Cancer  Society: Can assist with transportation, wigs, general needs, runs Look Good Feel Better.        825-681-3599 ? Cancer Care: Provides financial assistance, online support groups, medication/co-pay assistance.  1-800-813-HOPE 872-226-7979) ? [place] Assists [place] cancer patients and their families through emotional , educational and financial support.  (234)812-9032 ? [place] DSS Where to apply for food stamps, Medicaid and utility assistance. 332-005-1827 ? [facility]: Transportation to medical appointments. 609-860-2367 ? Social Security Administration: May apply for disability if have a Stage IV cancer. (501)823-3609 415-260-2122 ? [employer], Disability and Transit Services: Assists with nutrition, care and transit needs. [facility] Support Programs: @10RELATIVEDAYS @ > Cancer Support Group  2nd Tuesday of the month 1pm-2pm, Journey Room  > Creative Journey  3rd Tuesday of the month 1130am-1pm, Journey Room  > Look Good Feel Better  1st Wednesday of the month 10am-12 noon, Journey Room (Call [place] to register [phone])

## 2015-09-29 NOTE — Progress Notes (Signed)
Dean Fraction, MD [address] [place] [address]  Multiple myeloma without remission ([place]) - Plan: pomalidomide (POMALYST) 4 MG capsule, dexamethasone (DECADRON) 4 MG tablet  CURRENT THERAPY: Planning for upcoming treatment  INTERVAL HISTORY: Dean [name]. 80 y.o. male returns for followup of Multiple myeloma, monoclonal IgG kappa with scattered osseous lesions.  Treated with RVD (03/17/2014- 07/28/2014) with transition to Rd complicated by rash and intolerance with patient reported fatigue.  Now with a detectable M-spike and PET avid involvement in right ribs.   He reports that he feels good.  He is pleased with his muscle strength reporting, "now I can get my hand wrapped around my wrist" showing me in the process that there is a 1 cm space between his #1 and #2 fingers as he circumferentially wrapped his fingers around his wrist.  We discussed treatment options moving forward.  He is advised that he cannot compare past treatment to future treatments as the medications are very different, but in other respects, similar.    Review of Systems  Constitutional: Negative.  Negative for chills, fever and weight loss.  HENT: Negative.   Eyes: Negative.   Respiratory: Negative.  Negative for cough.   Cardiovascular: Negative.  Negative for chest pain.  Gastrointestinal: Negative.   Genitourinary: Negative.   Musculoskeletal: Negative.   Skin: Negative.   Neurological: Negative.  Negative for weakness.  Endo/Heme/Allergies: Negative.   Psychiatric/Behavioral: Negative.     Past Medical History:  Diagnosis Date  . BPH (benign prostatic hyperplasia)   . Colonic diverticular abscess 01/08/2015  . Diverticulitis 32/44/01   complicated by abscess and required percutaneous drainage  . GERD (gastroesophageal reflux disease)   . H/O ETOH abuse   . History of chemotherapy last done jan 2017  . History of radiation therapy 01/05/13-02/10/13   45 gray to left  occipital condyle region  . Intra-abdominal abscess ([place])   . Multiple myeloma ([place]) 2015  . Neuropathy ([place])   . Radiation 02/21/14-03/08/14   right posterior chest wall area 30 gray  . Radiation 04/19/14-05/02/14   lumbar spine 25 gray  . Skull lesion    Left occipital condyle  . Wrist fracture, left    x 2    Past Surgical History:  Procedure Laterality Date  . COLONOSCOPY N/A 04/19/2015   Procedure: COLONOSCOPY;  Surgeon: Danie Binder, MD;  Location: [facility];  Service: Endoscopy;  Laterality: N/A;  1:30 PM  . CYST EXCISION  1959   tail bone    Family History  Problem Relation Age of Onset  . Diabetes Mother   . Stroke Mother   . Hypertension Father     Social History   Social History  . Marital status: Married    Spouse name: N/A  . Number of children: 3  . Years of education: N/A   Occupational History  .  Retired   Social History Main Topics  . Smoking status: Never Smoker  . Smokeless tobacco: Never Used  . Alcohol use No     Comment: " Not much no more"  . Drug use: No  . Sexual activity: Not Asked   Other Topics Concern  . None   Social History Narrative  . None     PHYSICAL EXAMINATION  ECOG PERFORMANCE STATUS: 1 - Symptomatic but completely ambulatory  Vitals:   09/28/15 1006  BP: 130/76  Pulse: 75  Resp: 20  Temp: 97.4 F (36.3  C)    GENERAL:alert, no distress, well nourished, well developed, comfortable, cooperative, obese, smiling and unaccompanied SKIN: skin color, texture, turgor are normal, no rashes or significant lesions HEAD: Normocephalic, No masses, lesions, tenderness or abnormalities EYES: normal, EOMI, Conjunctiva are pink and non-injected EARS: External ears normal OROPHARYNX:lips, buccal mucosa, and tongue normal and mucous membranes are moist  NECK: supple, trachea midline LYMPH:  no hepatosplenomegaly BREAST:not examined LUNGS: clear to auscultation  HEART: regular rate & rhythm, no murmurs and no  gallops ABDOMEN:abdomen soft, obese and normal bowel sounds BACK: Back symmetric, no curvature. EXTREMITIES:less then 2 second capillary refill, no joint deformities, effusion, or inflammation, no edema, no skin discoloration, no clubbing, no cyanosis  NEURO: alert & oriented x 3 with fluent speech, no focal motor/sensory deficits, normal ambulation   LABORATORY DATA: CBC    Component Value Date/Time   WBC 5.5 08/24/2015 1148   RBC 4.84 08/24/2015 1148   HGB 13.9 08/24/2015 1148   HCT 42.2 08/24/2015 1148   PLT 149 (L) 08/24/2015 1148   MCV 87.2 08/24/2015 1148   MCH 28.7 08/24/2015 1148   MCHC 32.9 08/24/2015 1148   RDW 14.3 08/24/2015 1148   LYMPHSABS 1.6 08/24/2015 1148   MONOABS 0.6 08/24/2015 1148   EOSABS 0.0 08/24/2015 1148   BASOSABS 0.0 08/24/2015 1148      Chemistry      Component Value Date/Time   NA 136 08/24/2015 1148   K 3.9 08/24/2015 1148   CL 105 08/24/2015 1148   CO2 25 08/24/2015 1148   BUN 19 08/24/2015 1148   CREATININE 0.69 08/24/2015 1148   CREATININE 0.68 (L) 02/16/2015 1027      Component Value Date/Time   CALCIUM 8.3 (L) 08/24/2015 1148   ALKPHOS 24 (L) 08/24/2015 1148   AST 18 08/24/2015 1148   ALT 16 (L) 08/24/2015 1148   BILITOT 0.8 08/24/2015 1148        PENDING LABS:   RADIOGRAPHIC STUDIES:  No results found.   PATHOLOGY:    ASSESSMENT AND PLAN:  Multiple myeloma without remission ([facility]) Multiple myeloma, monoclonal IgG kappa with scattered osseous lesions.  Treated with RVD (03/17/2014- 07/28/2014) with transition to Rd complicated by rash and intolerance with patient reported fatigue.  Now with a detectable M-spike and PET avid involvement in right ribs.  Oncology history is updated.  Labs from July 2017 are reviewed.  I personally reviewed and went over laboratory results with the patient.  The results are noted within this dictation.  Labs in 2-3 weeks: CBC diff, CMET, SPEP+IFE, light chain assay, IgG, IgA, IgM, B2M.    Zometa will be provided if Ca++ is WNL.  Since he does have PET avid disease and now a detectable M-spike, he requires therapy.  I have discussed treatment options and recommended Pomalyst 4 mg PO days 1-21 every 28 days and Dexamethasone 40 mg weekly.  I have reviewed the risks, benefits, alternatives, and side effects of therapy.  I have printed prescription for Pomalyst and given to Angie.  He will be referred to chemotherapy teaching.  Message is sent to [facility] and [facility].    He is printed patient information to review for Pomalyst and these are provided to him.  He will return in ~ 2-3 weeks for follow-up.   ORDERS PLACED FOR THIS ENCOUNTER: No orders of the defined types were placed in this encounter.   MEDICATIONS PRESCRIBED THIS ENCOUNTER: Meds ordered this encounter  Medications  . pomalidomide (POMALYST) 4 MG capsule  Sig: Take 1 capsule (4 mg total) by mouth daily. Take with water on days 1-21. Repeat every 28 days.    Dispense:  21 capsule    Refill:  1    Order Specific Question:   Supervising Provider    Answer:   [name] [id number]  . dexamethasone (DECADRON) 4 MG tablet    Sig: Take 10 tablets (40 mg total) by mouth once a week.    Dispense:  40 tablet    Refill:  2    Order Specific Question:   Supervising Provider    Answer:   [name] [id number]    THERAPY PLAN:  Given his detectable M-spike and PET demonstrating right rib activity (excluding area of right rib fracture) he is a candidate for treatment moving forward.  Recommended treatment is Pomalyst 4 mg PO days 1-21 every 28 days and Dexamethasone 40 mg weekly (or we could split it into 20 mg twice weekly).  All questions were answered. The patient knows to call the clinic with any problems, questions or concerns. We can certainly see the patient much sooner if necessary.  Patient and plan discussed with Dr. Ancil Linsey and she is in agreement with the aforementioned.   This  note is electronically signed by: Doy Mince 09/29/2015 9:13 AM

## 2015-10-02 ENCOUNTER — Telehealth (HOSPITAL_COMMUNITY): Payer: Self-pay | Admitting: *Deleted

## 2015-10-03 ENCOUNTER — Telehealth (HOSPITAL_COMMUNITY): Payer: Self-pay | Admitting: Emergency Medicine

## 2015-10-03 NOTE — Telephone Encounter (Signed)
Left a message for pt to call me back

## 2015-10-03 NOTE — Telephone Encounter (Signed)
Pt states that he does not want to take pomalyst.  Spoke with Kirby Crigler, pt is going to wait and talk about options at next office visit.

## 2015-10-10 ENCOUNTER — Inpatient Hospital Stay (HOSPITAL_COMMUNITY): Payer: Medicare Other

## 2015-10-11 ENCOUNTER — Other Ambulatory Visit (HOSPITAL_COMMUNITY): Payer: Medicare Other

## 2015-10-19 ENCOUNTER — Other Ambulatory Visit (HOSPITAL_COMMUNITY): Payer: Self-pay | Admitting: Pharmacist

## 2015-10-19 NOTE — Progress Notes (Signed)
[name], MD [address] [place] [address]  Multiple Myeloma, IgG kappa  Left occipital condyle infiltrative lesion, not amenable to biopsy. Palliative XRT without a tissue diagnosis  Chest x-ray and right rib films were obtained which showed a soft tissue mass along the right posterior fifth rib with some rib destruction.  Monoclonal IgG kappa, normal IgG levels, suppressed IgM, abnormal kappa/lamda ratio  PET/CT  03/16/2014 Scattered hypermetabolic osseous lesions, including the calvarium, ribs, left scapula, sacrum, and left femoral shaft. An expansile left vertebral body lesion at L3 extends into the epidural space of the left lateral recess, and probably merits lumbar spine MRI assessment.  BMBX on 02/20/2014 with 33% kappa restricted plasma cells Normal FISH, normal cytogenetics  Peripheral evaluation shows very small M spike, normal CBC, normal CMP, myeloma survey grossly underestimates extent of bone involvement   Multiple myeloma without remission ([place])   11/16/2012 Initial Diagnosis    L occipital condyle destructive lesion, not biopsied secondary to location. XRT      12/01/2012 Imaging    L3 superior endplate compression FX, no lytic lesions to suggest myeloma      02/06/2014 Imaging    soft tissue mass along the right posterior fifth rib with some rib destruction      02/16/2014 Miscellaneous    Normal CBC, Normal CMP, kappa,lamda ratio of 2.62 (abnormal), UIEP with slightly restricted band, IgG kappa, SPEP/IEP with monoclonal protein at 0.79 g/dl, normal igG, suppressed IGM      02/20/2014 Bone Marrow Biopsy    33% kappa restricted plasma cells Normal FISH, normal cytogenetics      02/21/2014 - 03/08/2014 Radiation Therapy    30Gy to R rib lesion, lesion not biopsied      03/16/2014 PET scan    Scattered hypermetabolic osseous lesions, including the calvarium, ribs, left scapula, sacrum, and left femoral shaft. An expansile  left vertebral body lesion at L3 extends into the epidural space of the left lateral recess,       03/17/2014 - 04/07/2014 Chemotherapy    Velcade and Dexamethasone due to initial denial of Revlimid by insurance company.  Zometa monthly.      03/22/2014 Imaging    MR_ L-Spine- Enhancing lesions compatible with multiple myeloma at L2, L3, and S1-2.      04/07/2014 - 07/28/2014 Chemotherapy    RVD with Revlimid at 25 mg days 1-14.  Revlimid dose reduced to 25 mg every other day x 14 days with 7 day respite beginning on day 1 of cycle 2.      04/17/2014 Adverse Reaction    Revlimid-induced rash.  Treated with steroids.      07/28/2014 - 08/04/2014 Chemotherapy    Revlimid daily      08/04/2014 Adverse Reaction    Velcade-induced peripheral neuropathy      08/04/2014 Treatment Plan Change    D/C Velcade      08/11/2014 PET scan    Response to therapy. Improvement and resolution of foci of osseous hypermetabolism.      08/22/2014 - 08/28/2014 Chemotherapy    Revlimid 25 mg days 1-21 every 28 days      08/28/2014 Adverse Reaction    Revlimid-induced rash. Medication held.  Medrol dose Pak prescribed.      09/04/2014 - 01/08/2015 Chemotherapy    Revlimid 10 mg every other day, without dexamethasone      01/08/2015 - 01/15/2015 Hospital Admission    Colonic diverticular abscess  with IR drain placement      03/02/2015 PET scan    Only bony uptake is low activity at site of deformity/callus at R second rib FX, high activity in sigmoid colon, advanced sigmoid divertidulosis. Abscess not resolved?      06/04/2015 Imaging    CT nonobstructive L renal calculus, diverticulosis of descending and sigmoid colon without inflammation, moderate prostatic enlargement      07/12/2015 Surgery    Diverticulitis s/p robotic sigmoid colectomy with Dr. Johney Maine      08/23/2015 PET scan    Primarily similar hypermetabolic osseous foci within the R sided ribs. new L third rib focus of hypermetab and sclerosis is  favored to be related to healing FX. no soft tissue myeloma id'd. Presumed postop hypermetab and edema about the R pelvic wall        CURRENT THERAPY: Zometa  INTERVAL HISTORY: Dean [name]. 80 y.o. male returns for followup of multiple myeloma, monoclonal IgG kappa with scattered osseous lesions. He has sigmoid diverticulosis and has undergone treatment for a diverticular abscess. He had recently had a robotic sigmoid colectomy on 6/8.  Mr. Denio is unaccompanied.  The patient questions when he can stop coming to our clinic. I spoke with the patient about his disease and the necessity of our visits to monitor his condition.  He feels good today. He reports he has been eating too many sweets and has gained about 8 lbs. "I've been doing more and more". He reports his arms have been getting stronger as he has been able to do more. He is active and feeling well.   The patient is requesting a refill of his pain medication. He typically takes them after he gets off his lawnmower or has been active working outdoors.   He denies abdominal pain or diarrhea. No new pain in general. He refused additional revlimid. He has refused pomalyst.    Past Medical History:  Diagnosis Date  . BPH (benign prostatic hyperplasia)   . Colonic diverticular abscess 01/08/2015  . Diverticulitis 35/32/99   complicated by abscess and required percutaneous drainage  . GERD (gastroesophageal reflux disease)   . H/O ETOH abuse   . History of chemotherapy last done jan 2017  . History of radiation therapy 01/05/13-02/10/13   45 gray to left occipital condyle region  . Intra-abdominal abscess ([place])   . Multiple myeloma ([place]) 2015  . Neuropathy ([place])   . Radiation 02/21/14-03/08/14   right posterior chest wall area 30 gray  . Radiation 04/19/14-05/02/14   lumbar spine 25 gray  . Skull lesion    Left occipital condyle  . Wrist fracture, left    x 2    has Neuropathy ([facility]); BPH (benign prostatic hyperplasia);  GERD (gastroesophageal reflux disease); H/O ETOH abuse; Skull lesion; Neck pain; Fatigue; Loss of weight; Insomnia; Allergic rhinitis; Lumbar compression fracture ([place]); Dermatitis; Multiple myeloma without remission ([place]); Thrombocytopenia ([place]); Abnormal CT scan, sigmoid colon; Abnormal PET scan of colon; Special screening for malignant neoplasms, colon; and Diverticulitis s/p robotic sigmoid colectomy 07/11/2015 on his problem list.     is allergic to codeine; morphine and related; and revlimid [lenalidomide].  Mr. Mcbain had no medications administered during this visit.  Past Surgical History:  Procedure Laterality Date  . COLONOSCOPY N/A 04/19/2015   Procedure: COLONOSCOPY;  Surgeon: Danie Binder, MD;  Location[facility];  Service: Endoscopy;  Laterality: N/A;  1:30 PM  . CYST EXCISION  1959   tail bone  Denies any headaches, dizziness, double vision, fevers, chills, night sweats, trouble sleeping, nausea, vomiting, chest pain, heart palpitations, shortness of breath, blood in stool, black tarry stool, urinary pain, urinary burning, urinary frequency, hematuria. Positive for joint pain. Arthritis in neck, shoulders, and knees.   14 point review of systems was performed and is negative except as detailed under history of present illness and above   PHYSICAL EXAMINATION  ECOG PERFORMANCE STATUS: 0 - Asymptomatic   Vitals:   10/22/15 0846  BP: 129/63  Pulse: (!) 57  Resp: 16  Temp: 97.7 F (36.5 C)    GENERAL:alert, no distress, well nourished, well developed, comfortable, cooperative, obese and smiling SKIN: skin color, texture, turgor are normal, no rashes or significant lesions  HEAD: Normocephalic, No masses, lesions, tenderness or abnormalities EYES: normal, PERRLA, EOMI, Conjunctiva are pink and non-injected. Wears dark sunglasses all the time EARS: External ears normal OROPHARYNX:lips, buccal mucosa, and tongue normal and mucous membranes are moist  NECK:  supple, no adenopathy, thyroid normal size, non-tender, without nodularity, no stridor, non-tender, trachea midline LYMPH:  no palpable lymphadenopathy BREAST:not examined LUNGS: clear to auscultation  HEART: regular rate & rhythm, no murmurs and no gallops ABDOMEN:abdomen soft, non-tender, obese and normal bowel sounds BACK: Back symmetric, no curvature., No CVA tenderness EXTREMITIES:less then 2 second capillary refill, no joint deformities, effusion, or inflammation, no edema, no skin discoloration, no clubbing, no cyanosis  NEURO: alert & oriented x 3 with fluent speech, no focal motor/sensory deficits, gait normal   LABORATORY DATA: I have reviewed the labs below. CBC    Component Value Date/Time   WBC 5.5 08/24/2015 1148   RBC 4.84 08/24/2015 1148   HGB 13.9 08/24/2015 1148   HCT 42.2 08/24/2015 1148   PLT 149 (L) 08/24/2015 1148   MCV 87.2 08/24/2015 1148   MCH 28.7 08/24/2015 1148   MCHC 32.9 08/24/2015 1148   RDW 14.3 08/24/2015 1148   LYMPHSABS 1.6 08/24/2015 1148   MONOABS 0.6 08/24/2015 1148   EOSABS 0.0 08/24/2015 1148   BASOSABS 0.0 08/24/2015 1148      Chemistry      Component Value Date/Time   NA 136 08/24/2015 1148   K 3.9 08/24/2015 1148   CL 105 08/24/2015 1148   CO2 25 08/24/2015 1148   BUN 19 08/24/2015 1148   CREATININE 0.69 08/24/2015 1148   CREATININE 0.68 (L) 02/16/2015 1027      Component Value Date/Time   CALCIUM 8.3 (L) 08/24/2015 1148   ALKPHOS 24 (L) 08/24/2015 1148   AST 18 08/24/2015 1148   ALT 16 (L) 08/24/2015 1148   BILITOT 0.8 08/24/2015 1148          RADIOGRAPHY: I have personally reviewed the radiological images as listed and agreed with the findings in the report. Study Result   CLINICAL DATA:  Subsequent Treatment strategy for history of multiple myeloma. Colonic resection 08/12/2015. Ethanol abuse.  EXAM: NUCLEAR MEDICINE PET WHOLE BODY  TECHNIQUE: 11.3 mCi F-18 FDG was injected intravenously. Full-ring PET  imaging was performed from the vertex to the feet after the radiotracer. CT data was obtained and used for attenuation correction and anatomic localization.  FASTING BLOOD GLUCOSE:  Value:  101 mg/dl  COMPARISON:  03/02/2015. Abdominal pelvic CT of 06/04/2015 is also reviewed.  FINDINGS: NECK  No areas of abnormal hypermetabolism.  CHEST  No areas of abnormal hypermetabolism.  ABDOMEN/PELVIS  New hypermetabolism and subcutaneous thickening about the right upper pelvis, including on image 198/ series 3. This  measures a S.U.V. max of 5.4 and likely postoperative. No well-defined fluid collection.  Edema within high left pelvis corresponding to hypermetabolism. This measures a S.U.V. max of 5.1, including on image 196/series 3. Also likely postoperative, given interval rectosigmoid colonic resection and primary reanastomosis.  SKELETON  Right second rib hypermetabolism with chronic deformity. This measures a S.U.V. max of 3.9 today versus a S.U.V. max of 5.4 previously.  Expansile deformity within the fifth posterior lateral right rib is similar on CT, measuring 3.6 cm. Hypermetabolism at a S.U.V. max of 4.0 today versus a S.U.V. max of 3.5 previously  Anterior left third rib hypermetabolism with subtle callus deposition. This measures a S.U.V. max of 4.7 on image 109/series 3. This findings are new since the prior.  Hypermetabolism about a anterior osteophyte at approximately T8 level measures a S.U.V. max of 7.1 today versus 10.4 previously, and is favored to be degenerative.  Incidental note is made of hypermetabolism about the radial aspect of the left hand, possibly the base of the thumb. Suboptimally evaluated, including measuring a S.U.V. max of 5.3. Likely new.  CT IMAGES PERFORMED FOR ATTENUATION CORRECTION  expected cerebral volume loss for age. Bilateral carotid atherosclerosis. No cervical adenopathy. Mild cardiomegaly  with multivessel coronary artery atherosclerosis. No thoracic adenopathy. Presumed scarring in the posterior right upper lobe, similar. Bilateral low-density renal lesions which are likely cysts. Punctate left renal collecting system calculus. Caudate lobe prominence, without specific evidence of cirrhosis. Left renal sinus cysts. Extensive residual colonic diverticulosis. Moderate prostatomegaly. No abdominopelvic adenopathy. Mild L4 compression deformity is chronic.  IMPRESSION: 1. primarily similar hypermetabolic osseous foci within the right-sided ribs. A new left third rib focus of hypermetabolism and sclerosis is favored to be related to healing fracture. 2. No soft tissue myeloma identified. 3. Hypermetabolism about the radial aspect of the left side of the hand is suboptimally evaluated. Consider physical exam correlation. Possibly related to trauma. 4. Presumed postoperative hypermetabolism and edema about the right pelvic wall and intraperitoneal left pelvis.   Electronically Signed   By: Abigail Miyamoto M.D.   On: 08/23/2015 12:31      ASSESSMENT AND PLAN:  IgG kappa myeloma Multiple bone lesions seen on PET imaging RVD therapy Rash on revlimid Insomnia Hypocalcemia Diverticulitis/peridiverticular abscess seen on PET imaging, patient asymptomatic IR drain placement of diverticular abscess Sigmoid colon diverticulosis Sigmoid colectomy (robotic assisted) 07/13/2015   At his last visit I reviewed his PET in detail. His M spike is detectable and we had discussed restarting therapy since his diverticular abscess is resolved. He has refused, unfortunately,   The patient questions when he can stop coming to our clinic. I spoke with the patient about his disease again, we reviewed his prior history and how sick he has been in the past. Unfortunately now that he is doing well he misunderstands that his disease can recur. We discussed this at length again today.   I  have refilled his pain medication.  He will return for follow up in December with repeat labs. We can discuss repeating his PET at follow-up. PET imaging has been the most reliable way to monitor his disease activity.   Orders Placed This Encounter  Procedures  . CBC with Differential    Standing Status:   Future    Standing Expiration Date:   10/21/2016  . Comprehensive metabolic panel    Standing Status:   Future    Standing Expiration Date:   10/21/2016  . Kappa/lambda light chains  Standing Status:   Future    Standing Expiration Date:   10/21/2016  . Protein electrophoresis, serum    Standing Status:   Future    Standing Expiration Date:   10/21/2016  . Immunofixation electrophoresis    Standing Status:   Future    Standing Expiration Date:   10/21/2016  . Immunofixation, urine    Standing Status:   Future    Standing Expiration Date:   10/21/2016  . Protein electrophoresis, urine    Standing Status:   Future    Standing Expiration Date:   10/21/2016    All questions were answered. The patient knows to call the clinic with any problems, questions or concerns. We can certainly see the patient much sooner if necessary.   This note is electronically signed.  This document serves as a record of services personally performed by Ancil Linsey, MD. It was created on her behalf by Arlyce Harman, a trained medical scribe. The creation of this record is based on the scribe's personal observations and the provider's statements to them. This document has been checked and approved by the attending provider.  I have reviewed the above documentation for accuracy and completeness, and I agree with the above.  Kelby Fam. Penland MD

## 2015-10-22 ENCOUNTER — Encounter (HOSPITAL_COMMUNITY): Payer: Self-pay | Admitting: Hematology & Oncology

## 2015-10-22 ENCOUNTER — Encounter (HOSPITAL_COMMUNITY): Payer: Medicare Other | Attending: Hematology and Oncology | Admitting: Hematology & Oncology

## 2015-10-22 DIAGNOSIS — M899 Disorder of bone, unspecified: Secondary | ICD-10-CM | POA: Insufficient documentation

## 2015-10-22 DIAGNOSIS — M542 Cervicalgia: Secondary | ICD-10-CM | POA: Insufficient documentation

## 2015-10-22 DIAGNOSIS — D472 Monoclonal gammopathy: Secondary | ICD-10-CM | POA: Insufficient documentation

## 2015-10-22 DIAGNOSIS — C9000 Multiple myeloma not having achieved remission: Secondary | ICD-10-CM

## 2015-10-22 DIAGNOSIS — R630 Anorexia: Secondary | ICD-10-CM | POA: Insufficient documentation

## 2015-10-22 DIAGNOSIS — R634 Abnormal weight loss: Secondary | ICD-10-CM | POA: Insufficient documentation

## 2015-10-22 DIAGNOSIS — N400 Enlarged prostate without lower urinary tract symptoms: Secondary | ICD-10-CM | POA: Insufficient documentation

## 2015-10-22 DIAGNOSIS — K219 Gastro-esophageal reflux disease without esophagitis: Secondary | ICD-10-CM | POA: Insufficient documentation

## 2015-10-22 DIAGNOSIS — K5730 Diverticulosis of large intestine without perforation or abscess without bleeding: Secondary | ICD-10-CM | POA: Diagnosis not present

## 2015-10-22 DIAGNOSIS — G4700 Insomnia, unspecified: Secondary | ICD-10-CM | POA: Diagnosis not present

## 2015-10-22 DIAGNOSIS — M545 Low back pain: Secondary | ICD-10-CM

## 2015-10-22 DIAGNOSIS — Z923 Personal history of irradiation: Secondary | ICD-10-CM | POA: Insufficient documentation

## 2015-10-22 MED ORDER — OXYCODONE-ACETAMINOPHEN 10-325 MG PO TABS: 1.0000 | ORAL_TABLET | ORAL | 0 refills | Status: DC | PRN

## 2015-10-22 NOTE — Patient Instructions (Addendum)
[place] at [facility] Discharge Instructions  RECOMMENDATIONS MADE BY THE CONSULTANT AND ANY TEST RESULTS WILL BE SENT TO YOUR REFERRING PHYSICIAN.  You saw Dr. Whitney Muse today. Follow up in December with lab work and 24 hour urine.  Thank you for choosing [place] at [facility] to provide your oncology and hematology care.  To afford each patient quality time with our provider, please arrive at least 15 minutes before your scheduled appointment time.   Beginning January 23rd 2017 lab work for the [facility] will be done in the  Main lab at [facility] on 1st floor. If you have a lab appointment with the [facility] please come in thru the  Main Entrance and check in at the main information desk  You need to re-schedule your appointment should you arrive 10 or more minutes late.  We strive to give you quality time with our providers, and arriving late affects you and other patients whose appointments are after yours.  Also, if you no show three or more times for appointments you may be dismissed from the clinic at the providers discretion.     Again, thank you for choosing [facility].  Our hope is that these requests will decrease the amount of time that you wait before being seen by our physicians.       _____________________________________________________________  Should you have questions after your visit to [facility], please contact our office at [phone] between the hours of 8:30 a.m. and 4:30 p.m.  Voicemails left after 4:30 p.m. will not be returned until the following business day.  For prescription refill requests, have your pharmacy contact our office.         Resources For Cancer Patients and their Caregivers ? American Cancer Society: Can assist with transportation, wigs, general needs, runs Look Good Feel Better.        ([phone] ? Cancer Care: Provides financial assistance,  online support groups, medication/co-pay assistance.  1-800-813-HOPE (581) 060-9801) ? [place] Assists [place] cancer patients and their families through emotional , educational and financial support.  309-351-5156 ? [place] DSS Where to apply for food stamps, Medicaid and utility assistance. 954-879-9465 ? RCATS: Transportation to medical appointments. (346)521-7110 ? Social Security Administration: May apply for disability if have a Stage IV cancer. ([phone] [phone]) 055-3291 ? [employer], Disability and Transit Services: Assists with nutrition, care and transit needs. [place] Support Programs: @10RELATIVEDAYS @ > Cancer Support Group  2nd Tuesday of the month 1pm-2pm, Journey Room  > Creative Journey  3rd Tuesday of the month 1130am-1pm, Journey Room  > Look Good Feel Better  1st Wednesday of the month 10am-12 noon, Journey Room (Call [place] to register [phone])

## 2015-10-23 ENCOUNTER — Encounter (HOSPITAL_COMMUNITY): Payer: Self-pay | Admitting: Hematology & Oncology

## 2015-11-14 ENCOUNTER — Other Ambulatory Visit (HOSPITAL_COMMUNITY): Payer: Self-pay

## 2015-11-14 DIAGNOSIS — N400 Enlarged prostate without lower urinary tract symptoms: Secondary | ICD-10-CM

## 2015-11-14 MED ORDER — TAMSULOSIN HCL 0.4 MG PO CAPS: 0.4000 mg | ORAL_CAPSULE | Freq: Every day | ORAL | 3 refills | Status: DC

## 2015-11-14 NOTE — Telephone Encounter (Signed)
Received refill request from patients pharmacy for Flomax. Chart checked and refilled.

## 2015-12-07 ENCOUNTER — Ambulatory Visit: Payer: Medicare Other | Admitting: Family Medicine

## 2015-12-14 ENCOUNTER — Other Ambulatory Visit (HOSPITAL_COMMUNITY): Payer: Self-pay

## 2015-12-14 DIAGNOSIS — C9000 Multiple myeloma not having achieved remission: Secondary | ICD-10-CM

## 2015-12-14 DIAGNOSIS — G629 Polyneuropathy, unspecified: Secondary | ICD-10-CM

## 2015-12-14 DIAGNOSIS — G4700 Insomnia, unspecified: Secondary | ICD-10-CM

## 2015-12-14 MED ORDER — GABAPENTIN 400 MG PO CAPS: 2000.0000 mg | ORAL_CAPSULE | Freq: Every day | ORAL | 2 refills | Status: DC

## 2015-12-14 MED ORDER — ALPRAZOLAM 1 MG PO TABS: 1.0000 mg | ORAL_TABLET | Freq: Every evening | ORAL | 3 refills | Status: DC | PRN

## 2015-12-14 NOTE — Telephone Encounter (Signed)
Received refill request from patients pharmacy for Xanax and neurontin. Chart checked and refilled.

## 2016-01-11 ENCOUNTER — Other Ambulatory Visit: Payer: Self-pay

## 2016-01-15 ENCOUNTER — Encounter (HOSPITAL_COMMUNITY): Payer: Medicare Other | Attending: Hematology & Oncology

## 2016-01-15 DIAGNOSIS — Z9889 Other specified postprocedural states: Secondary | ICD-10-CM | POA: Diagnosis not present

## 2016-01-15 DIAGNOSIS — R21 Rash and other nonspecific skin eruption: Secondary | ICD-10-CM | POA: Diagnosis not present

## 2016-01-15 DIAGNOSIS — G4700 Insomnia, unspecified: Secondary | ICD-10-CM | POA: Diagnosis not present

## 2016-01-15 DIAGNOSIS — Z923 Personal history of irradiation: Secondary | ICD-10-CM | POA: Diagnosis not present

## 2016-01-15 DIAGNOSIS — K5730 Diverticulosis of large intestine without perforation or abscess without bleeding: Secondary | ICD-10-CM | POA: Diagnosis not present

## 2016-01-15 DIAGNOSIS — N400 Enlarged prostate without lower urinary tract symptoms: Secondary | ICD-10-CM | POA: Diagnosis not present

## 2016-01-15 DIAGNOSIS — C9000 Multiple myeloma not having achieved remission: Secondary | ICD-10-CM | POA: Diagnosis not present

## 2016-01-15 DIAGNOSIS — G8929 Other chronic pain: Secondary | ICD-10-CM | POA: Diagnosis not present

## 2016-01-15 DIAGNOSIS — K219 Gastro-esophageal reflux disease without esophagitis: Secondary | ICD-10-CM | POA: Insufficient documentation

## 2016-01-15 DIAGNOSIS — Z9221 Personal history of antineoplastic chemotherapy: Secondary | ICD-10-CM | POA: Diagnosis not present

## 2016-01-15 LAB — COMPREHENSIVE METABOLIC PANEL
ALT: 12 U/L — ABNORMAL LOW (ref 17–63)
AST: 18 U/L (ref 15–41)
Albumin: 3.9 g/dL (ref 3.5–5.0)
Alkaline Phosphatase: 23 U/L — ABNORMAL LOW (ref 38–126)
Anion gap: 8 (ref 5–15)
BUN: 16 mg/dL (ref 6–20)
CO2: 25 mmol/L (ref 22–32)
Calcium: 8.9 mg/dL (ref 8.9–10.3)
Chloride: 105 mmol/L (ref 101–111)
Creatinine, Ser: 0.9 mg/dL (ref 0.61–1.24)
GFR calc Af Amer: >60 mL/min (ref >60)
GFR calc non Af Amer: >60 mL/min (ref >60)
Glucose, Bld: 95 mg/dL (ref 65–99)
Potassium: 4 mmol/L (ref 3.5–5.1)
Sodium: 138 mmol/L (ref 135–145)
Total Bilirubin: 1 mg/dL (ref 0.3–1.2)
Total Protein: 6.7 g/dL (ref 6.5–8.1)

## 2016-01-15 LAB — CBC WITH DIFFERENTIAL/PLATELET
Basophils Absolute: 0 10*3/uL (ref 0.0–0.1)
Basophils Relative: 0 %
Eosinophils Absolute: 0 10*3/uL (ref 0.0–0.7)
Eosinophils Relative: 1 %
HCT: 48.1 % (ref 39.0–52.0)
Hemoglobin: 15.8 g/dL (ref 13.0–17.0)
Lymphocytes Relative: 31 %
Lymphs Abs: 2.2 10*3/uL (ref 0.7–4.0)
MCH: 29.3 pg (ref 26.0–34.0)
MCHC: 32.8 g/dL (ref 30.0–36.0)
MCV: 89.2 fL (ref 78.0–100.0)
Monocytes Absolute: 0.7 10*3/uL (ref 0.1–1.0)
Monocytes Relative: 10 %
Neutro Abs: 4.1 10*3/uL (ref 1.7–7.7)
Neutrophils Relative %: 58 %
Platelets: 152 10*3/uL (ref 150–400)
RBC: 5.39 MIL/uL (ref 4.22–5.81)
RDW: 13.9 % (ref 11.5–15.5)
WBC: 7 10*3/uL (ref 4.0–10.5)

## 2016-01-16 LAB — KAPPA/LAMBDA LIGHT CHAINS
Kappa free light chain: 22 mg/L — ABNORMAL HIGH (ref 3.3–19.4)
Kappa, lambda light chain ratio: 1.65 (ref 0.26–1.65)
Lambda free light chains: 13.3 mg/L (ref 5.7–26.3)

## 2016-01-16 LAB — IMMUNOFIXATION, URINE

## 2016-01-17 LAB — PROTEIN ELECTROPHORESIS, SERUM
A/G Ratio: 1.4 (ref 0.7–1.7)
Albumin ELP: 3.6 g/dL (ref 2.9–4.4)
Alpha-1-Globulin: 0.2 g/dL (ref 0.0–0.4)
Alpha-2-Globulin: 0.8 g/dL (ref 0.4–1.0)
Beta Globulin: 1 g/dL (ref 0.7–1.3)
Gamma Globulin: 0.6 g/dL (ref 0.4–1.8)
Globulin, Total: 2.6 g/dL (ref 2.2–3.9)
M-Spike, %: 0.2 g/dL — ABNORMAL HIGH
Total Protein ELP: 6.2 g/dL (ref 6.0–8.5)

## 2016-01-17 LAB — UIFE/LIGHT CHAINS/TP QN, 24-HR UR
% BETA, Urine: 0 %
ALPHA 1 URINE: 0 %
Albumin, U: 100 %
Alpha 2, Urine: 0 %
Free Kappa/Lambda Ratio: 11.6 — ABNORMAL HIGH (ref 2.04–10.37)
Free Lambda Lt Chains,Ur: 0.1 mg/L — ABNORMAL LOW (ref 0.24–6.66)
Free Lt Chn Excr Rate: 1.16 mg/L — ABNORMAL LOW (ref 1.35–24.19)
GAMMA GLOBULIN URINE: 0 %
PDF: 0
Total Protein, Urine-Ur/day: 142 mg/24 hr (ref 30–150)
Total Protein, Urine: 7.1 mg/dL

## 2016-01-17 LAB — IMMUNOFIXATION ELECTROPHORESIS
IgA: 126 mg/dL (ref 61–437)
IgG (Immunoglobin G), Serum: 597 mg/dL — ABNORMAL LOW (ref 700–1600)
IgM, Serum: 50 mg/dL (ref 15–143)
Total Protein ELP: 6 g/dL (ref 6.0–8.5)

## 2016-01-22 ENCOUNTER — Encounter (HOSPITAL_COMMUNITY): Payer: Self-pay | Admitting: Hematology & Oncology

## 2016-01-22 ENCOUNTER — Encounter (HOSPITAL_BASED_OUTPATIENT_CLINIC_OR_DEPARTMENT_OTHER): Payer: Medicare Other | Admitting: Hematology & Oncology

## 2016-01-22 VITALS — BP 132/46 | HR 71 | Temp 98.5°F | Resp 18 | Wt 246.3 lb

## 2016-01-22 DIAGNOSIS — K5730 Diverticulosis of large intestine without perforation or abscess without bleeding: Secondary | ICD-10-CM | POA: Diagnosis not present

## 2016-01-22 DIAGNOSIS — K5720 Diverticulitis of large intestine with perforation and abscess without bleeding: Secondary | ICD-10-CM

## 2016-01-22 DIAGNOSIS — C9000 Multiple myeloma not having achieved remission: Secondary | ICD-10-CM

## 2016-01-22 DIAGNOSIS — G4700 Insomnia, unspecified: Secondary | ICD-10-CM | POA: Diagnosis not present

## 2016-01-22 DIAGNOSIS — R21 Rash and other nonspecific skin eruption: Secondary | ICD-10-CM

## 2016-01-22 NOTE — Progress Notes (Signed)
[name], MD [address] [place] [address]  Multiple Myeloma, IgG kappa  Left occipital condyle infiltrative lesion, not amenable to biopsy. Palliative XRT without a tissue diagnosis  Chest x-ray and right rib films were obtained which showed a soft tissue mass along the right posterior fifth rib with some rib destruction.  Monoclonal IgG kappa, normal IgG levels, suppressed IgM, abnormal kappa/lamda ratio  PET/CT  03/16/2014 Scattered hypermetabolic osseous lesions, including the calvarium, ribs, left scapula, sacrum, and left femoral shaft. An expansile left vertebral body lesion at L3 extends into the epidural space of the left lateral recess, and probably merits lumbar spine MRI assessment.  BMBX on 02/20/2014 with 33% kappa restricted plasma cells Normal FISH, normal cytogenetics  Peripheral evaluation shows very small M spike, normal CBC, normal CMP, myeloma survey grossly underestimates extent of bone involvement   Multiple myeloma without remission ([place])   11/16/2012 Initial Diagnosis    L occipital condyle destructive lesion, not biopsied secondary to location. XRT      12/01/2012 Imaging    L3 superior endplate compression FX, no lytic lesions to suggest myeloma      02/06/2014 Imaging    soft tissue mass along the right posterior fifth rib with some rib destruction      02/16/2014 Miscellaneous    Normal CBC, Normal CMP, kappa,lamda ratio of 2.62 (abnormal), UIEP with slightly restricted band, IgG kappa, SPEP/IEP with monoclonal protein at 0.79 g/dl, normal igG, suppressed IGM      02/20/2014 Bone Marrow Biopsy    33% kappa restricted plasma cells Normal FISH, normal cytogenetics      02/21/2014 - 03/08/2014 Radiation Therapy    30Gy to R rib lesion, lesion not biopsied      03/16/2014 PET scan    Scattered hypermetabolic osseous lesions, including the calvarium, ribs, left scapula, sacrum, and left femoral shaft. An expansile  left vertebral body lesion at L3 extends into the epidural space of the left lateral recess,       03/17/2014 - 04/07/2014 Chemotherapy    Velcade and Dexamethasone due to initial denial of Revlimid by insurance company.  Zometa monthly.      03/22/2014 Imaging    MR_ L-Spine- Enhancing lesions compatible with multiple myeloma at L2, L3, and S1-2.      04/07/2014 - 07/28/2014 Chemotherapy    RVD with Revlimid at 25 mg days 1-14.  Revlimid dose reduced to 25 mg every other day x 14 days with 7 day respite beginning on day 1 of cycle 2.      04/17/2014 Adverse Reaction    Revlimid-induced rash.  Treated with steroids.      07/28/2014 - 08/04/2014 Chemotherapy    Revlimid daily      08/04/2014 Adverse Reaction    Velcade-induced peripheral neuropathy      08/04/2014 Treatment Plan Change    D/C Velcade      08/11/2014 PET scan    Response to therapy. Improvement and resolution of foci of osseous hypermetabolism.      08/22/2014 - 08/28/2014 Chemotherapy    Revlimid 25 mg days 1-21 every 28 days      08/28/2014 Adverse Reaction    Revlimid-induced rash. Medication held.  Medrol dose Pak prescribed.      09/04/2014 - 01/08/2015 Chemotherapy    Revlimid 10 mg every other day, without dexamethasone      01/08/2015 - 01/15/2015 Hospital Admission    Colonic diverticular abscess  with IR drain placement      03/02/2015 PET scan    Only bony uptake is low activity at site of deformity/callus at R second rib FX, high activity in sigmoid colon, advanced sigmoid divertidulosis. Abscess not resolved?      06/04/2015 Imaging    CT nonobstructive L renal calculus, diverticulosis of descending and sigmoid colon without inflammation, moderate prostatic enlargement      07/12/2015 Surgery    Diverticulitis s/p robotic sigmoid colectomy with Dr. Johney Maine      08/23/2015 PET scan    Primarily similar hypermetabolic osseous foci within the R sided ribs. new L third rib focus of hypermetab and sclerosis is  favored to be related to healing FX. no soft tissue myeloma id'd. Presumed postop hypermetab and edema about the R pelvic wall        CURRENT THERAPY: Zometa  INTERVAL HISTORY: Ruven Corradi. 80 y.o. male returns for followup of multiple myeloma, monoclonal IgG kappa with scattered osseous lesions. He has sigmoid diverticulosis and has undergone treatment for a diverticular abscess. He had a robotic sigmoid colectomy on 6/8.  Patient is accompanied by his daughter. He states he feels well. Denies poor appetite. Dean [name] has gained 7 pounds since August. He is active. Chronic back pain that bothers him more after he has been out working. Denies new pain. No change in his bowels. No BRBPR. No abdominal pain. No headaches. Last PET was in July. Still not willing to do therapy for his disease currently.   Past Medical History:  Diagnosis Date  . BPH (benign prostatic hyperplasia)   . Colonic diverticular abscess 01/08/2015  . Diverticulitis 16/10/96   complicated by abscess and required percutaneous drainage  . GERD (gastroesophageal reflux disease)   . H/O ETOH abuse   . History of chemotherapy last done jan 2017  . History of radiation therapy 01/05/13-02/10/13   45 gray to left occipital condyle region  . Intra-abdominal abscess ([place])   . Multiple myeloma ([place]) 2015  . Neuropathy ([place])   . Radiation 02/21/14-03/08/14   right posterior chest wall area 30 gray  . Radiation 04/19/14-05/02/14   lumbar spine 25 gray  . Skull lesion    Left occipital condyle  . Wrist fracture, left    x 2    has Neuropathy (HCC); BPH (benign prostatic hyperplasia); GERD (gastroesophageal reflux disease); H/O ETOH abuse; Skull lesion; Neck pain; Fatigue; Loss of weight; Insomnia; Allergic rhinitis; Lumbar compression fracture ([place]); Dermatitis; Multiple myeloma without remission ([place]); Thrombocytopenia ([place]); Abnormal CT scan, sigmoid colon; Abnormal PET scan of colon; Special screening for malignant  neoplasms, colon; and Diverticulitis s/p robotic sigmoid colectomy 07/11/2015 on his problem list.     is allergic to codeine; morphine and related; and revlimid [lenalidomide].  Mr. Adkison had no medications administered during this visit.  Past Surgical History:  Procedure Laterality Date  . COLONOSCOPY N/A 04/19/2015   Procedure: COLONOSCOPY;  Surgeon: Danie Binder, MD;  Location: [facility];  Service: Endoscopy;  Laterality: N/A;  1:30 PM  . CYST EXCISION  1959   tail bone    Denies any headaches, dizziness, double vision, fevers, chills, night sweats, trouble sleeping, nausea, vomiting, chest pain, heart palpitations, shortness of breath, blood in stool, black tarry stool, urinary pain, urinary burning, urinary frequency, hematuria. Positive for joint pain. Arthritis in neck, shoulders, and knees.  14 point review of systems was performed and is negative except as detailed under history of present illness and above  PHYSICAL EXAMINATION  ECOG PERFORMANCE STATUS: 1 - Symptomatic but completely ambulatory   Vitals:   01/22/16 1149  BP: (!) 132/46  Pulse: 71  Resp: 18  Temp: 98.5 F (36.9 C)    GENERAL:alert, no distress, well nourished, well developed, comfortable, cooperative, obese and smiling SKIN: skin color, texture, turgor are normal, no rashes or significant lesions  HEAD: Normocephalic, No masses, lesions, tenderness or abnormalities EYES: normal, PERRLA, EOMI, Conjunctiva are pink and non-injected. Wears dark sunglasses all the time EARS: External ears normal OROPHARYNX:lips, buccal mucosa, and tongue normal and mucous membranes are moist  NECK: supple, no adenopathy, thyroid normal size, non-tender, without nodularity, no stridor, non-tender, trachea midline LYMPH:  no palpable lymphadenopathy BREAST:not examined LUNGS: clear to auscultation  HEART: regular rate & rhythm, no murmurs and no gallops ABDOMEN:abdomen soft, non-tender, obese and normal bowel  sounds BACK: Back symmetric, no curvature., No CVA tenderness EXTREMITIES:less then 2 second capillary refill, no joint deformities, effusion, or inflammation, no edema, no skin discoloration, no clubbing, no cyanosis  NEURO: alert & oriented x 3 with fluent speech, no focal motor/sensory deficits, gait normal   LABORATORY DATA: I have reviewed the labs below. CBC    Component Value Date/Time   WBC 7.0 01/15/2016 0957   RBC 5.39 01/15/2016 0957   HGB 15.8 01/15/2016 0957   HCT 48.1 01/15/2016 0957   PLT 152 01/15/2016 0957   MCV 89.2 01/15/2016 0957   MCH 29.3 01/15/2016 0957   MCHC 32.8 01/15/2016 0957   RDW 13.9 01/15/2016 0957   LYMPHSABS 2.2 01/15/2016 0957   MONOABS 0.7 01/15/2016 0957   EOSABS 0.0 01/15/2016 0957   BASOSABS 0.0 01/15/2016 0957      Chemistry      Component Value Date/Time   NA 138 01/15/2016 0957   K 4.0 01/15/2016 0957   CL 105 01/15/2016 0957   CO2 25 01/15/2016 0957   BUN 16 01/15/2016 0957   CREATININE 0.90 01/15/2016 0957   CREATININE 0.68 (L) 02/16/2015 1027      Component Value Date/Time   CALCIUM 8.9 01/15/2016 0957   ALKPHOS 23 (L) 01/15/2016 0957   AST 18 01/15/2016 0957   ALT 12 (L) 01/15/2016 0957   BILITOT 1.0 01/15/2016 0957          RADIOGRAPHY: I have personally reviewed the radiological images as listed and agreed with the findings in the report. Study Result   CLINICAL DATA:  Subsequent Treatment strategy for history of multiple myeloma. Colonic resection 08/12/2015. Ethanol abuse.  EXAM: NUCLEAR MEDICINE PET WHOLE BODY  TECHNIQUE: 11.3 mCi F-18 FDG was injected intravenously. Full-ring PET imaging was performed from the vertex to the feet after the radiotracer. CT data was obtained and used for attenuation correction and anatomic localization.  FASTING BLOOD GLUCOSE:  Value:  101 mg/dl  COMPARISON:  03/02/2015. Abdominal pelvic CT of 06/04/2015 is also reviewed.  FINDINGS: NECK  No areas of  abnormal hypermetabolism.  CHEST  No areas of abnormal hypermetabolism.  ABDOMEN/PELVIS  New hypermetabolism and subcutaneous thickening about the right upper pelvis, including on image 198/ series 3. This measures a S.U.V. max of 5.4 and likely postoperative. No well-defined fluid collection.  Edema within high left pelvis corresponding to hypermetabolism. This measures a S.U.V. max of 5.1, including on image 196/series 3. Also likely postoperative, given interval rectosigmoid colonic resection and primary reanastomosis.  SKELETON  Right second rib hypermetabolism with chronic deformity. This measures a S.U.V. max of 3.9 today versus a S.U.V. max  of 5.4 previously.  Expansile deformity within the fifth posterior lateral right rib is similar on CT, measuring 3.6 cm. Hypermetabolism at a S.U.V. max of 4.0 today versus a S.U.V. max of 3.5 previously  Anterior left third rib hypermetabolism with subtle callus deposition. This measures a S.U.V. max of 4.7 on image 109/series 3. This findings are new since the prior.  Hypermetabolism about a anterior osteophyte at approximately T8 level measures a S.U.V. max of 7.1 today versus 10.4 previously, and is favored to be degenerative.  Incidental note is made of hypermetabolism about the radial aspect of the left hand, possibly the base of the thumb. Suboptimally evaluated, including measuring a S.U.V. max of 5.3. Likely new.  CT IMAGES PERFORMED FOR ATTENUATION CORRECTION  expected cerebral volume loss for age. Bilateral carotid atherosclerosis. No cervical adenopathy. Mild cardiomegaly with multivessel coronary artery atherosclerosis. No thoracic adenopathy. Presumed scarring in the posterior right upper lobe, similar. Bilateral low-density renal lesions which are likely cysts. Punctate left renal collecting system calculus. Caudate lobe prominence, without specific evidence of cirrhosis. Left renal sinus  cysts. Extensive residual colonic diverticulosis. Moderate prostatomegaly. No abdominopelvic adenopathy. Mild L4 compression deformity is chronic.  IMPRESSION: 1. primarily similar hypermetabolic osseous foci within the right-sided ribs. A new left third rib focus of hypermetabolism and sclerosis is favored to be related to healing fracture. 2. No soft tissue myeloma identified. 3. Hypermetabolism about the radial aspect of the left side of the hand is suboptimally evaluated. Consider physical exam correlation. Possibly related to trauma. 4. Presumed postoperative hypermetabolism and edema about the right pelvic wall and intraperitoneal left pelvis.   Electronically Signed   By: Abigail Miyamoto M.D.   On: 08/23/2015 12:31    PATHOLOGY:    ASSESSMENT AND PLAN:  IgG kappa myeloma Multiple bone lesions seen on PET imaging RVD therapy Rash on revlimid Insomnia Hypocalcemia Diverticulitis/peridiverticular abscess seen on PET imaging, patient asymptomatic IR drain placement of diverticular abscess Sigmoid colon diverticulosis Sigmoid colectomy (robotic assisted) 07/13/2015  At his last visit I reviewed his PET in detail. His M spike is detectable and we had discussed restarting therapy since his diverticular abscess is resolved. He has refused, unfortunately, Today we addressed the same issues. He feels well and notes that he is open to therapy maybe in the future.   The patient questions when he can stop coming to our clinic. I spoke with the patient about his disease again, we reviewed his prior history and how sick he has been in the past. Unfortunately now that he is doing well he misunderstands that his disease can recur. We discussed this at length again today.   He does not need any refills of medications at this time.  Hajime appears well and feels good.  His appetite is good and he has gained 7 lbs.   I personally reviewed and went over laboratory results with the  patient.  The results are noted within this dictation.We will call with results from additional labs from today.   He will be due for repeat imaging in February 2018.  Order is placed for PET imaging.  This is scheduled for 03/06/2016.  Labs in Feb 2018: CBC diff, CMET, SPEP+IFE, IgG, IgA, IgM, light chain assay, and B2M.  Return in Feb 2018 following PET scan to review results, repeat labs and follow-up appointment.  Orders Placed This Encounter  Procedures  . NM PET Image Restage (PS) Whole Body    Standing Status:   Future  Standing Expiration Date:   01/21/2017    Order Specific Question:   Reason for Exam (SYMPTOM  OR DIAGNOSIS REQUIRED)    Answer:   myeloma    Order Specific Question:   Preferred imaging location?    Answer:   [facility]    Order Specific Question:   If indicated for the ordered procedure, I authorize the administration of a radiopharmaceutical per Radiology protocol    Answer:   Yes  . CBC with Differential    Standing Status:   Future    Standing Expiration Date:   07/26/2016  . Comprehensive metabolic panel    Standing Status:   Future    Standing Expiration Date:   07/26/2016  . Kappa/lambda light chains    Standing Status:   Future    Standing Expiration Date:   07/26/2016  . Beta 2 microglobuline, serum    Standing Status:   Future    Standing Expiration Date:   07/26/2016  . IgG, IgA, IgM    Standing Status:   Future    Standing Expiration Date:   07/26/2016  . Immunofixation electrophoresis    Standing Status:   Future    Standing Expiration Date:   07/26/2016  . Protein electrophoresis, serum    Standing Status:   Future    Standing Expiration Date:   07/26/2016   All questions were answered. The patient knows to call the clinic with any problems, questions or concerns. We can certainly see the patient much sooner if necessary.   This note is electronically signed.  This document serves as a record of services personally performed by  Ancil Linsey, MD. It was created on her behalf by Arlyce Harman, a trained medical scribe. The creation of this record is based on the scribe's personal observations and the provider's statements to them. This document has been checked and approved by the attending provider.  I have reviewed the above documentation for accuracy and completeness, and I agree with the above.  Kelby Fam. Domanique Luckett MD

## 2016-01-22 NOTE — Patient Instructions (Signed)
[place] at [facility] Discharge Instructions  RECOMMENDATIONS MADE BY THE CONSULTANT AND ANY TEST RESULTS WILL BE SENT TO YOUR REFERRING PHYSICIAN.  You saw Dr.Penland today. You were given 3 refills on Xanax prescription written on 11/10. PET scan in February. Follow up with [name] after PET. See Amy at checkout for appointments.  Thank you for choosing [place] at [facility] to provide your oncology and hematology care.  To afford each patient quality time with our provider, please arrive at least 15 minutes before your scheduled appointment time.   Beginning January 23rd 2017 lab work for the [facility] will be done in the  Main lab at [facility] on 1st floor. If you have a lab appointment with the [facility] please come in thru the  Main Entrance and check in at the main information desk  You need to re-schedule your appointment should you arrive 10 or more minutes late.  We strive to give you quality time with our providers, and arriving late affects you and other patients whose appointments are after yours.  Also, if you no show three or more times for appointments you may be dismissed from the clinic at the providers discretion.     Again, thank you for choosing [facility].  Our hope is that these requests will decrease the amount of time that you wait before being seen by our physicians.       _____________________________________________________________  Should you have questions after your visit to [facility], please contact our office at [phone] between the hours of 8:30 a.m. and 4:30 p.m.  Voicemails left after 4:30 p.m. will not be returned until the following business day.  For prescription refill requests, have your pharmacy contact our office.         Resources For Cancer Patients and their Caregivers ? American Cancer Society: Can assist with transportation, wigs, general needs,  runs Look Good Feel Better.        819-277-6860 ? Cancer Care: Provides financial assistance, online support groups, medication/co-pay assistance.  1-800-813-HOPE (438) 715-0831) ? [place] Assists [place] cancer patients and their families through emotional , educational and financial support.  930-223-9793 ? [place] DSS Where to apply for food stamps, Medicaid and utility assistance. 551 309 6186 ? RCATS: Transportation to medical appointments. (249)077-4982 ? Social Security Administration: May apply for disability if have a Stage IV cancer. [phone] 825-315-0396 ? [employer], Disability and Transit Services: Assists with nutrition, care and transit needs. [place] Support Programs: @10RELATIVEDAYS @ > Cancer Support Group  2nd Tuesday of the month 1pm-2pm, Journey Room  > Creative Journey  3rd Tuesday of the month 1130am-1pm, Journey Room  > Look Good Feel Better  1st Wednesday of the month 10am-12 noon, Journey Room (Call [place] to register [phone])

## 2016-01-29 ENCOUNTER — Encounter (HOSPITAL_COMMUNITY): Payer: Self-pay | Admitting: Hematology & Oncology

## 2016-03-06 ENCOUNTER — Encounter (HOSPITAL_COMMUNITY)
Admission: RE | Admit: 2016-03-06 | Discharge: 2016-03-06 | Disposition: A | Discharge status: Home / Self Care | Payer: Medicare Other | Source: Ambulatory Visit | Attending: Hematology & Oncology | Admitting: Hematology & Oncology

## 2016-03-06 ENCOUNTER — Encounter (HOSPITAL_COMMUNITY): Payer: Self-pay

## 2016-03-06 DIAGNOSIS — C9000 Multiple myeloma not having achieved remission: Secondary | ICD-10-CM | POA: Diagnosis not present

## 2016-03-06 LAB — GLUCOSE, CAPILLARY: Glucose-Capillary: 96 mg/dL (ref 65–99)

## 2016-03-06 MED ORDER — FLUDEOXYGLUCOSE F - 18 (FDG) INJECTION
12.0800 | Freq: Once | INTRAVENOUS | Status: AC | PRN
  Administered 2016-03-06: 12.08 via INTRAVENOUS

## 2016-03-10 ENCOUNTER — Ambulatory Visit (HOSPITAL_COMMUNITY): Payer: Medicare Other | Admitting: Hematology & Oncology

## 2016-03-12 ENCOUNTER — Other Ambulatory Visit (HOSPITAL_COMMUNITY): Payer: Self-pay

## 2016-03-12 DIAGNOSIS — N400 Enlarged prostate without lower urinary tract symptoms: Secondary | ICD-10-CM

## 2016-03-12 MED ORDER — TAMSULOSIN HCL 0.4 MG PO CAPS: 0.4000 mg | ORAL_CAPSULE | Freq: Every day | ORAL | 3 refills | Status: DC

## 2016-03-12 NOTE — Telephone Encounter (Signed)
Received refill request from patients pharmacy for Flomax. Chart checked, reviewed with PA-C, and refilled.

## 2016-03-17 ENCOUNTER — Encounter (HOSPITAL_COMMUNITY): Payer: Medicare Other

## 2016-03-17 ENCOUNTER — Encounter (HOSPITAL_COMMUNITY): Payer: Medicare Other | Attending: Oncology | Admitting: Adult Health

## 2016-03-17 VITALS — BP 137/61 | HR 84 | Temp 98.0°F | Resp 18 | Wt 249.8 lb

## 2016-03-17 DIAGNOSIS — Z923 Personal history of irradiation: Secondary | ICD-10-CM | POA: Diagnosis not present

## 2016-03-17 DIAGNOSIS — G479 Sleep disorder, unspecified: Secondary | ICD-10-CM

## 2016-03-17 DIAGNOSIS — G4700 Insomnia, unspecified: Secondary | ICD-10-CM | POA: Diagnosis not present

## 2016-03-17 DIAGNOSIS — Z9221 Personal history of antineoplastic chemotherapy: Secondary | ICD-10-CM | POA: Diagnosis not present

## 2016-03-17 DIAGNOSIS — Z9889 Other specified postprocedural states: Secondary | ICD-10-CM | POA: Diagnosis not present

## 2016-03-17 DIAGNOSIS — K219 Gastro-esophageal reflux disease without esophagitis: Secondary | ICD-10-CM | POA: Diagnosis not present

## 2016-03-17 DIAGNOSIS — K5730 Diverticulosis of large intestine without perforation or abscess without bleeding: Secondary | ICD-10-CM | POA: Insufficient documentation

## 2016-03-17 DIAGNOSIS — R21 Rash and other nonspecific skin eruption: Secondary | ICD-10-CM | POA: Diagnosis not present

## 2016-03-17 DIAGNOSIS — C9000 Multiple myeloma not having achieved remission: Secondary | ICD-10-CM

## 2016-03-17 DIAGNOSIS — N400 Enlarged prostate without lower urinary tract symptoms: Secondary | ICD-10-CM | POA: Insufficient documentation

## 2016-03-17 DIAGNOSIS — G8929 Other chronic pain: Secondary | ICD-10-CM | POA: Insufficient documentation

## 2016-03-17 LAB — CBC WITH DIFFERENTIAL/PLATELET
Basophils Absolute: 0 10*3/uL (ref 0.0–0.1)
Basophils Relative: 0 %
Eosinophils Absolute: 0 10*3/uL (ref 0.0–0.7)
Eosinophils Relative: 1 %
HCT: 47.7 % (ref 39.0–52.0)
Hemoglobin: 16.2 g/dL (ref 13.0–17.0)
Lymphocytes Relative: 30 %
Lymphs Abs: 2 10*3/uL (ref 0.7–4.0)
MCH: 30.4 pg (ref 26.0–34.0)
MCHC: 34 g/dL (ref 30.0–36.0)
MCV: 89.5 fL (ref 78.0–100.0)
Monocytes Absolute: 0.7 10*3/uL (ref 0.1–1.0)
Monocytes Relative: 10 %
Neutro Abs: 3.9 10*3/uL (ref 1.7–7.7)
Neutrophils Relative %: 59 %
Platelets: 150 10*3/uL (ref 150–400)
RBC: 5.33 MIL/uL (ref 4.22–5.81)
RDW: 13.5 % (ref 11.5–15.5)
WBC: 6.6 10*3/uL (ref 4.0–10.5)

## 2016-03-17 NOTE — Patient Instructions (Addendum)
[place] at [facility] Discharge Instructions  RECOMMENDATIONS MADE BY THE CONSULTANT AND ANY TEST RESULTS WILL BE SENT TO YOUR REFERRING PHYSICIAN.  Exam with Mike Craze, NP. Return to the clinic in 3 months with labs.  Please see Angie for appointments as you leave this floor.   Labs today downstairs as you leave. Zometa to restart and we will do it monthly.    Thank you for choosing [place] at [facility] to provide your oncology and hematology care.  To afford each patient quality time with our provider, please arrive at least 15 minutes before your scheduled appointment time.    If you have a lab appointment with the [facility] please come in thru the  Main Entrance and check in at the main information desk  You need to re-schedule your appointment should you arrive 10 or more minutes late.  We strive to give you quality time with our providers, and arriving late affects you and other patients whose appointments are after yours.  Also, if you no show three or more times for appointments you may be dismissed from the clinic at the providers discretion.     Again, thank you for choosing [facility].  Our hope is that these requests will decrease the amount of time that you wait before being seen by our physicians.       _____________________________________________________________  Should you have questions after your visit to [facility], please contact our office at [phone] between the hours of 8:30 a.m. and 4:30 p.m.  Voicemails left after 4:30 p.m. will not be returned until the following business day.  For prescription refill requests, have your pharmacy contact our office.       Resources For Cancer Patients and their Caregivers ? American Cancer Society: Can assist with transportation, wigs, general needs, runs Look Good Feel Better.        [phone] ? Cancer Care: Provides  financial assistance, online support groups, medication/co-pay assistance.  1-800-813-HOPE 669-322-4145) ? [facility] Assists [place] cancer patients and their families through emotional , educational and financial support.  425-646-5037 ? [place] DSS Where to apply for food stamps, Medicaid and utility assistance. 857-584-4975 ? RCATS: Transportation to medical appointments. 937-766-4439 ? Social Security Administration: May apply for disability if have a Stage IV cancer. [phone] 754-704-2202 ? [employer], Disability and Transit Services: Assists with nutrition, care and transit needs. [place] Support Programs: @10RELATIVEDAYS @ > Cancer Support Group  2nd Tuesday of the month 1pm-2pm, Journey Room  > Creative Journey  3rd Tuesday of the month 1130am-1pm, Journey Room  > Look Good Feel Better  1st Wednesday of the month 10am-12 noon, Journey Room (Call [place] to register [phone])

## 2016-03-17 NOTE — Progress Notes (Addendum)
Dean [name], [place] [address]   CLINIC:  Medical Oncology/Hematology  PCP:  [name], MD [address] [place] [address] [phone]   REASON FOR VISIT:  Follow-up for multiple myeloma   CURRENT THERAPY: Zometa   BRIEF ONCOLOGIC HISTORY:    Multiple myeloma without remission ([place])   11/16/2012 Initial Diagnosis    L occipital condyle destructive lesion, not biopsied secondary to location. XRT      12/01/2012 Imaging    L3 superior endplate compression FX, no lytic lesions to suggest myeloma      02/06/2014 Imaging    soft tissue mass along the right posterior fifth rib with some rib destruction      02/16/2014 Miscellaneous    Normal CBC, Normal CMP, kappa,lamda ratio of 2.62 (abnormal), UIEP with slightly restricted band, IgG kappa, SPEP/IEP with monoclonal protein at 0.79 g/dl, normal igG, suppressed IGM      02/20/2014 Bone Marrow Biopsy    33% kappa restricted plasma cells Normal FISH, normal cytogenetics      02/21/2014 - 03/08/2014 Radiation Therapy    30Gy to R rib lesion, lesion not biopsied      03/16/2014 PET scan    Scattered hypermetabolic osseous lesions, including the calvarium, ribs, left scapula, sacrum, and left femoral shaft. An expansile left vertebral body lesion at L3 extends into the epidural space of the left lateral recess,       03/17/2014 - 04/07/2014 Chemotherapy    Velcade and Dexamethasone due to initial denial of Revlimid by insurance company.  Zometa monthly.      03/22/2014 Imaging    MR_ L-Spine- Enhancing lesions compatible with multiple myeloma at L2, L3, and S1-2.      04/07/2014 - 07/28/2014 Chemotherapy    RVD with Revlimid at 25 mg days 1-14.  Revlimid dose reduced to 25 mg every other day x 14 days with 7 day respite beginning on day 1 of cycle 2.      04/17/2014 Adverse Reaction    Revlimid-induced rash.  Treated with steroids.      07/28/2014 - 08/04/2014 Chemotherapy      Revlimid daily      08/04/2014 Adverse Reaction    Velcade-induced peripheral neuropathy      08/04/2014 Treatment Plan Change    D/C Velcade      08/11/2014 PET scan    Response to therapy. Improvement and resolution of foci of osseous hypermetabolism.      08/22/2014 - 08/28/2014 Chemotherapy    Revlimid 25 mg days 1-21 every 28 days      08/28/2014 Adverse Reaction    Revlimid-induced rash. Medication held.  Medrol dose Pak prescribed.      09/04/2014 - 01/08/2015 Chemotherapy    Revlimid 10 mg every other day, without dexamethasone      01/08/2015 - 01/15/2015 Hospital Admission    Colonic diverticular abscess with IR drain placement      03/02/2015 PET scan    Only bony uptake is low activity at site of deformity/callus at R second rib FX, high activity in sigmoid colon, advanced sigmoid divertidulosis. Abscess not resolved?      06/04/2015 Imaging    CT nonobstructive L renal calculus, diverticulosis of descending and sigmoid colon without inflammation, moderate prostatic enlargement      07/12/2015 Surgery    Diverticulitis s/p robotic sigmoid colectomy with Dr. Johney Maine      08/23/2015 PET scan    Primarily similar  hypermetabolic osseous foci within the R sided ribs. new L third rib focus of hypermetab and sclerosis is favored to be related to healing FX. no soft tissue myeloma id'd. Presumed postop hypermetab and edema about the R pelvic wall       03/06/2016 PET scan    1. Reduced activity in the prior lesion such as the right second and fifth rib lesions, activity nearly completely resolved and significantly less than added mediastinal blood pool activity. No new lesions are identified. 2. Other imaging findings of potential clinical significance: Coronary, aortic arch, and branch vessel atherosclerotic vascular disease. Aortoiliac atherosclerotic vascular disease. Enlarged prostate gland. Colonic diverticula.        HISTORY OF PRESENT ILLNESS:  (From Dr.  Donald Pore last note dated 01/22/16)    INTERVAL HISTORY:  Dean [name] reports to cancer center for follow-up of multiple myeloma. He has been feeling very well.  Denies any new bone pain or concerns. He recently had restaging PET scan and is here to review those results today.   His appetite is fair; endorses continued dysgeusia "but I make myself eat when I'm supposed to."  He has actually gained 2 His energy levels are fair; he has a hard time sleeping at night. He generally has to take Xanax, along with his gabapentin at bedtime to help him get to sleep; sometimes this is not effective.  He is taking the gabapentin more regularly throughout the day as well. "I just keep feeling better and better every day."   He has not had Zometa in several months.  This is due to his GI issues over the summer requiring colectomy.    He has chronic arthritis to bilateral shoulders and knees. He has occasional feet swelling, but this is improving.    REVIEW OF SYSTEMS:  Review of Systems  Constitutional: Positive for fatigue. Negative for chills, fever and unexpected weight change.  HENT:  Negative.  Negative for lump/mass.   Eyes: Negative.   Respiratory: Negative.  Negative for cough and shortness of breath.   Cardiovascular: Positive for leg swelling. Negative for chest pain and palpitations.  Gastrointestinal: Negative.  Negative for blood in stool, constipation, diarrhea, nausea and vomiting.  Endocrine: Negative.   Genitourinary: Negative.  Negative for dysuria and hematuria.   Musculoskeletal: Positive for arthralgias.  Skin: Negative.   Neurological: Positive for numbness (chronic peripheral neuropathy (taking Neurontin) ).  Hematological: Negative.   Psychiatric/Behavioral: Positive for sleep disturbance.     PAST MEDICAL/SURGICAL HISTORY:  Past Medical History:  Diagnosis Date  . BPH (benign prostatic hyperplasia)   . Colonic diverticular abscess 01/08/2015  . Diverticulitis 17/91/50    complicated by abscess and required percutaneous drainage  . GERD (gastroesophageal reflux disease)   . H/O ETOH abuse   . History of chemotherapy last done jan 2017  . History of radiation therapy 01/05/13-02/10/13   45 gray to left occipital condyle region  . Intra-abdominal abscess ([place])   . Multiple myeloma ([place]) 2015  . Neuropathy ([place])   . Radiation 02/21/14-03/08/14   right posterior chest wall area 30 gray  . Radiation 04/19/14-05/02/14   lumbar spine 25 gray  . Skull lesion    Left occipital condyle  . Wrist fracture, left    x 2   Past Surgical History:  Procedure Laterality Date  . COLONOSCOPY N/A 04/19/2015   Procedure: COLONOSCOPY;  Surgeon: Danie Binder, MD;  Location: [facility];  Service: Endoscopy;  Laterality: N/A;  1:30 PM  .  CYST EXCISION  1959   tail bone     SOCIAL HISTORY:  Social History   Social History  . Marital status: Married    Spouse name: N/A  . Number of children: 3  . Years of education: N/A   Occupational History  .  Retired   Social History Main Topics  . Smoking status: Never Smoker  . Smokeless tobacco: Never Used  . Alcohol use No     Comment: " Not much no more"  . Drug use: No  . Sexual activity: Not on file   Other Topics Concern  . Not on file   Social History Narrative  . No narrative on file    FAMILY HISTORY:  Family History  Problem Relation Age of Onset  . Diabetes Mother   . Stroke Mother   . Hypertension Father     CURRENT MEDICATIONS:  Outpatient Encounter Prescriptions as of 03/17/2016  Medication Sig Note  . ALPRAZolam (XANAX) 1 MG tablet Take 1-2 tablets (1-2 mg total) by mouth at bedtime as needed for sleep. For anxiety and/or sleep   . aspirin EC 325 MG tablet Take 1 tablet (325 mg total) by mouth daily.   . Calcium Carb-Cholecalciferol (CALCIUM-VITAMIN D3) 600-500 MG-UNIT CAPS Take 1 tablet by mouth 2 (two) times daily. With food (Patient taking differently: Take 1-2 tablets by mouth 3 (three)  times daily with meals. Takes two tablets in the morning, one tablet in the afternoon and one tablet at night.)   . EPINEPHrine 0.3 mg/0.3 mL IJ SOAJ injection Inject 0.3 mLs (0.3 mg total) into the muscle once.   . furosemide (LASIX) 20 MG tablet Take 1 tablet (20 mg total) by mouth daily. (Patient taking differently: Take 20 mg by mouth daily as needed for fluid or edema. )   . gabapentin (NEURONTIN) 400 MG capsule Take 5 capsules (2,000 mg total) by mouth daily.   Marland Kitchen omeprazole (PRILOSEC) 20 MG capsule Take 1 capsule (20 mg total) by mouth daily.   Marland Kitchen oxyCODONE (OXY IR/ROXICODONE) 5 MG immediate release tablet Take 1-2 tablets (5-10 mg total) by mouth every 6 (six) hours as needed for moderate pain, severe pain or breakthrough pain.   . tamsulosin (FLOMAX) 0.4 MG CAPS capsule Take 1 capsule (0.4 mg total) by mouth at bedtime.   Marland Kitchen zolendronic acid (ZOMETA) 4 MG/5ML injection Inject 4 mg into the vein every 28 (twenty-eight) days.  06/20/2015: He receives doses at [facility]. His last dose 06/14/15.   Facility-Administered Encounter Medications as of 03/17/2016  Medication  . sodium chloride 0.9 % injection 10 mL    ALLERGIES:  Allergies  Allergen Reactions  . Codeine Other (See Comments)    Severe headache  . Morphine And Related Other (See Comments)    Makes him feel weird  . Revlimid [Lenalidomide] Other (See Comments)    "Causes me to become weak"     PHYSICAL EXAM:  ECOG Performance status: 1 - Symptomatic, but independent.   Vitals:   03/17/16 1050  BP: 137/61  Pulse: 84  Resp: 18  Temp: 98 F (36.7 C)   Filed Weights   03/17/16 1050  Weight: 249 lb 12.8 oz (113.3 kg)    Physical Exam  Constitutional: He is oriented to person, place, and time and well-developed, well-nourished, and in no distress.  HENT:  Head: Normocephalic.  Mouth/Throat: Oropharynx is clear and moist. No oropharyngeal exudate.  Eyes: Conjunctivae are normal. Pupils are equal, round, and reactive to  light.  No scleral icterus.  Wears sunglasses d/t to light sensitivity   Neck: Normal range of motion. Neck supple.  Cardiovascular: Normal rate, regular rhythm and normal heart sounds.   Pulmonary/Chest: Effort normal. He has wheezes (mild expiratory wheezes to bases ).  Abdominal: Soft. Bowel sounds are normal. There is no tenderness.  Musculoskeletal: Normal range of motion. He exhibits edema (1+ BLE edema ).  Lymphadenopathy:    He has no cervical adenopathy.  Neurological: He is alert and oriented to person, place, and time. No cranial nerve deficit. Gait normal.  Skin: Skin is warm and dry. No rash noted.  Psychiatric: Mood, memory, affect and judgment normal.     LABORATORY DATA:  I have reviewed the labs as listed.  CBC    Component Value Date/Time   WBC 6.6 03/17/2016 1207   RBC 5.33 03/17/2016 1207   HGB 16.2 03/17/2016 1207   HCT 47.7 03/17/2016 1207   PLT 150 03/17/2016 1207   MCV 89.5 03/17/2016 1207   MCH 30.4 03/17/2016 1207   MCHC 34.0 03/17/2016 1207   RDW 13.5 03/17/2016 1207   LYMPHSABS 2.0 03/17/2016 1207   MONOABS 0.7 03/17/2016 1207   EOSABS 0.0 03/17/2016 1207   BASOSABS 0.0 03/17/2016 1207   CMP Latest Ref Rng & Units 01/15/2016 08/24/2015 07/12/2015  Glucose 65 - 99 mg/dL 95 99 121(H)  BUN 6 - 20 mg/dL 16 19 16   Creatinine 0.61 - 1.24 mg/dL 0.90 0.69 0.68  Sodium 135 - 145 mmol/L 138 136 138  Potassium 3.5 - 5.1 mmol/L 4.0 3.9 4.2  Chloride 101 - 111 mmol/L 105 105 110  CO2 22 - 32 mmol/L 25 25 23   Calcium 8.9 - 10.3 mg/dL 8.9 8.3(L) 8.2(L)  Total Protein 6.5 - 8.1 g/dL 6.7 6.5 -  Total Bilirubin 0.3 - 1.2 mg/dL 1.0 0.8 -  Alkaline Phos 38 - 126 U/L 23(L) 24(L) -  AST 15 - 41 U/L 18 18 -  ALT 17 - 63 U/L 12(L) 16(L) -    PENDING LABS:    DIAGNOSTIC IMAGING:  PET scan: 03/06/16    PATHOLOGY:  Bone marrow biopsy: 02/20/14   Cytogenetics: 02/20/14    ASSESSMENT & PLAN:   IgG kappa multiple myeloma:  -PET results reviewed with patient  and findings are stable.  -Resume Zometa; we will get him set up to resume this week.  -Return to cancer center in 3 months with labs.   Sleep disturbance:  -Encouraged him to try 2 tabs of Xanax (prescription previously written for him to take 1-2 tabs at bedtime).  -We discussed sleep hygiene and the importance of establishing a sleep schedule.      Dispo:  -Labs today; resume Zometa this week -Return to cancer center in 3 months with SPEP/IFE, CBC with diff, & CMET; orders placed today.    All questions were answered to patient's stated satisfaction. Encouraged patient to call with any new concerns or questions before his next visit to the cancer center and we can certain see him sooner, if needed.      Mike Craze, NP [place] [phone]

## 2016-03-18 ENCOUNTER — Encounter (HOSPITAL_COMMUNITY): Payer: Self-pay | Admitting: Adult Health

## 2016-03-20 ENCOUNTER — Encounter (HOSPITAL_BASED_OUTPATIENT_CLINIC_OR_DEPARTMENT_OTHER): Payer: Medicare Other

## 2016-03-20 ENCOUNTER — Encounter (HOSPITAL_COMMUNITY): Payer: Medicare Other

## 2016-03-20 ENCOUNTER — Encounter (HOSPITAL_COMMUNITY): Payer: Self-pay

## 2016-03-20 VITALS — BP 146/70 | HR 63 | Temp 97.6°F | Resp 18 | Wt 251.0 lb

## 2016-03-20 DIAGNOSIS — C9000 Multiple myeloma not having achieved remission: Secondary | ICD-10-CM

## 2016-03-20 DIAGNOSIS — G8929 Other chronic pain: Secondary | ICD-10-CM | POA: Diagnosis not present

## 2016-03-20 DIAGNOSIS — N400 Enlarged prostate without lower urinary tract symptoms: Secondary | ICD-10-CM | POA: Diagnosis not present

## 2016-03-20 DIAGNOSIS — K5730 Diverticulosis of large intestine without perforation or abscess without bleeding: Secondary | ICD-10-CM | POA: Diagnosis not present

## 2016-03-20 DIAGNOSIS — Z923 Personal history of irradiation: Secondary | ICD-10-CM | POA: Diagnosis not present

## 2016-03-20 DIAGNOSIS — Z9221 Personal history of antineoplastic chemotherapy: Secondary | ICD-10-CM | POA: Diagnosis not present

## 2016-03-20 LAB — COMPREHENSIVE METABOLIC PANEL
ALT: 12 U/L — ABNORMAL LOW (ref 17–63)
AST: 15 U/L (ref 15–41)
Albumin: 3.7 g/dL (ref 3.5–5.0)
Alkaline Phosphatase: 25 U/L — ABNORMAL LOW (ref 38–126)
Anion gap: 8 (ref 5–15)
BUN: 16 mg/dL (ref 6–20)
CO2: 26 mmol/L (ref 22–32)
Calcium: 9 mg/dL (ref 8.9–10.3)
Chloride: 105 mmol/L (ref 101–111)
Creatinine, Ser: 0.86 mg/dL (ref 0.61–1.24)
GFR calc Af Amer: >60 mL/min (ref >60)
GFR calc non Af Amer: >60 mL/min (ref >60)
Glucose, Bld: 107 mg/dL — ABNORMAL HIGH (ref 65–99)
Potassium: 4.2 mmol/L (ref 3.5–5.1)
Sodium: 139 mmol/L (ref 135–145)
Total Bilirubin: 0.6 mg/dL (ref 0.3–1.2)
Total Protein: 6.3 g/dL — ABNORMAL LOW (ref 6.5–8.1)

## 2016-03-20 MED ORDER — ZOLEDRONIC ACID 4 MG/100ML IV SOLN
4.0000 mg | Freq: Once | INTRAVENOUS | Status: AC
  Administered 2016-03-20: 4 mg via INTRAVENOUS
  Filled 2016-03-20: qty 100

## 2016-03-20 MED ORDER — SODIUM CHLORIDE 0.9 % IJ SOLN
10.0000 mL | INTRAMUSCULAR | Status: DC | PRN
  Administered 2016-03-20: 10 mL
  Filled 2016-03-20: qty 10

## 2016-03-20 MED ORDER — SODIUM CHLORIDE 0.9 % IV SOLN
Freq: Once | INTRAVENOUS | Status: AC
  Administered 2016-03-20: 10:00:00 via INTRAVENOUS

## 2016-03-20 NOTE — Patient Instructions (Signed)
[facility] at [facility] Discharge Instructions  RECOMMENDATIONS MADE BY THE CONSULTANT AND ANY TEST RESULTS WILL BE SENT TO YOUR REFERRING PHYSICIAN.  Zometa given  Follow up as scheduled.  Thank you for choosing [facility] at [facility] to provide your oncology and hematology care.  To afford each patient quality time with our provider, please arrive at least 15 minutes before your scheduled appointment time.    If you have a lab appointment with the Cancer Center please come in thru the  Main Entrance and check in at the main information desk  You need to re-schedule your appointment should you arrive 10 or more minutes late.  We strive to give you quality time with our providers, and arriving late affects you and other patients whose appointments are after yours.  Also, if you no show three or more times for appointments you may be dismissed from the clinic at the providers discretion.     Again, thank you for choosing [facility].  Our hope is that these requests will decrease the amount of time that you wait before being seen by our physicians.       _____________________________________________________________  Should you have questions after your visit to  Cancer Center, please contact our office at [phone] between the hours of 8:30 a.m. and 4:30 p.m.  Voicemails left after 4:30 p.m. will not be returned until the following business day.  For prescription refill requests, have your pharmacy contact our office.       Resources For Cancer Patients and their Caregivers ? American Cancer Society: Can assist with transportation, wigs, general needs, runs Look Good Feel Better.        1-888-227-6333 ? Cancer Care: Provides financial assistance, online support groups, medication/co-pay assistance.  1-800-813-HOPE (4673) ? [facility] Assists [place] cancer patients and their  families through emotional , educational and financial support.  336-427-4357 ? [place] DSS Where to apply for food stamps, Medicaid and utility assistance. 336-342-1394 ? [employer]: Transportation to medical appointments. 336-347-2287 ? Social Security Administration: May apply for disability if have a Stage IV cancer. 336-342-7796 1-800-772-1213 ? [place] Aging, Disability and Transit Services: Assists with nutrition, care and transit needs. 336-349-2343  Cancer Center Support Programs: @10RELATIVEDAYS@ > Cancer Support Group  2nd Tuesday of the month 1pm-2pm, Journey Room  > Creative Journey  3rd Tuesday of the month 1130am-1pm, Journey Room  > Look Good Feel Better  1st Wednesday of the month 10am-12 noon, Journey Room (Call [employer] to register 1-800-395-5775)   

## 2016-03-20 NOTE — Progress Notes (Signed)
Zometa given today per orders. Patient tolerated it well. Vitals stable and discharged home from clinic ambulatory. Follow up as scheduled.

## 2016-03-21 LAB — KAPPA/LAMBDA LIGHT CHAINS
Kappa free light chain: 20.9 mg/L — ABNORMAL HIGH (ref 3.3–19.4)
Kappa, lambda light chain ratio: 1.41 (ref 0.26–1.65)
Lambda free light chains: 14.8 mg/L (ref 5.7–26.3)

## 2016-03-21 LAB — IGG, IGA, IGM
IgA: 141 mg/dL (ref 61–437)
IgG (Immunoglobin G), Serum: 530 mg/dL — ABNORMAL LOW (ref 700–1600)
IgM, Serum: 43 mg/dL (ref 15–143)

## 2016-03-21 LAB — BETA 2 MICROGLOBULIN, SERUM: Beta-2 Microglobulin: 1.8 mg/L (ref 0.6–2.4)

## 2016-03-23 LAB — PROTEIN ELECTROPHORESIS, SERUM
A/G Ratio: 1.3 (ref 0.7–1.7)
Albumin ELP: 3.3 g/dL (ref 2.9–4.4)
Alpha-1-Globulin: 0.2 g/dL (ref 0.0–0.4)
Alpha-2-Globulin: 0.8 g/dL (ref 0.4–1.0)
Beta Globulin: 0.9 g/dL (ref 0.7–1.3)
Gamma Globulin: 0.6 g/dL (ref 0.4–1.8)
Globulin, Total: 2.5 g/dL (ref 2.2–3.9)
M-Spike, %: 0.2 g/dL — ABNORMAL HIGH
Total Protein ELP: 5.8 g/dL — ABNORMAL LOW (ref 6.0–8.5)

## 2016-03-24 LAB — IMMUNOFIXATION ELECTROPHORESIS
IgA: 143 mg/dL (ref 61–437)
IgG (Immunoglobin G), Serum: 536 mg/dL — ABNORMAL LOW (ref 700–1600)
IgM, Serum: 44 mg/dL (ref 15–143)
Total Protein ELP: 5.9 g/dL — ABNORMAL LOW (ref 6.0–8.5)

## 2016-04-16 ENCOUNTER — Encounter (HOSPITAL_COMMUNITY): Payer: Medicare Other | Attending: Oncology

## 2016-04-16 ENCOUNTER — Other Ambulatory Visit (HOSPITAL_COMMUNITY): Payer: Self-pay | Admitting: Adult Health

## 2016-04-16 ENCOUNTER — Other Ambulatory Visit (HOSPITAL_COMMUNITY): Payer: Self-pay | Admitting: Hematology & Oncology

## 2016-04-16 DIAGNOSIS — G4700 Insomnia, unspecified: Secondary | ICD-10-CM

## 2016-04-16 DIAGNOSIS — G629 Polyneuropathy, unspecified: Secondary | ICD-10-CM

## 2016-04-16 DIAGNOSIS — C9000 Multiple myeloma not having achieved remission: Secondary | ICD-10-CM

## 2016-04-16 LAB — COMPREHENSIVE METABOLIC PANEL
ALT: 13 U/L — ABNORMAL LOW (ref 17–63)
AST: 14 U/L — ABNORMAL LOW (ref 15–41)
Albumin: 3.7 g/dL (ref 3.5–5.0)
Alkaline Phosphatase: 23 U/L — ABNORMAL LOW (ref 38–126)
Anion gap: 6 (ref 5–15)
BUN: 23 mg/dL — ABNORMAL HIGH (ref 6–20)
CO2: 28 mmol/L (ref 22–32)
Calcium: 9.1 mg/dL (ref 8.9–10.3)
Chloride: 105 mmol/L (ref 101–111)
Creatinine, Ser: 0.96 mg/dL (ref 0.61–1.24)
GFR calc Af Amer: >60 mL/min (ref >60)
GFR calc non Af Amer: >60 mL/min (ref >60)
Glucose, Bld: 110 mg/dL — ABNORMAL HIGH (ref 65–99)
Potassium: 4.3 mmol/L (ref 3.5–5.1)
Sodium: 139 mmol/L (ref 135–145)
Total Bilirubin: 0.5 mg/dL (ref 0.3–1.2)
Total Protein: 6.5 g/dL (ref 6.5–8.1)

## 2016-04-16 MED ORDER — ALPRAZOLAM 1 MG PO TABS
2.0000 mg | ORAL_TABLET | Freq: Every evening | ORAL | 2 refills | Status: DC | PRN
Start: 2016-04-16 — End: 2016-10-01

## 2016-04-16 NOTE — Progress Notes (Signed)
[place] Controlled Substance Registry reviewed and Xanax refill appropriate.  Prescription printed and to be faxed to pharmacy by nursing.   Mike Craze, NP [place] [phone]

## 2016-04-17 ENCOUNTER — Encounter (HOSPITAL_COMMUNITY): Payer: Self-pay

## 2016-04-17 ENCOUNTER — Encounter (HOSPITAL_BASED_OUTPATIENT_CLINIC_OR_DEPARTMENT_OTHER): Payer: Medicare Other

## 2016-04-17 ENCOUNTER — Other Ambulatory Visit (HOSPITAL_COMMUNITY): Payer: Medicare Other

## 2016-04-17 VITALS — BP 143/77 | HR 74 | Temp 97.5°F | Resp 18 | Wt 248.8 lb

## 2016-04-17 DIAGNOSIS — C9000 Multiple myeloma not having achieved remission: Secondary | ICD-10-CM

## 2016-04-17 MED ORDER — SODIUM CHLORIDE 0.9 % IJ SOLN
3.0000 mL | Freq: Once | INTRAMUSCULAR | Status: AC | PRN
  Administered 2016-04-17: 3 mL via INTRAVENOUS

## 2016-04-17 MED ORDER — SODIUM CHLORIDE 0.9 % IV SOLN
Freq: Once | INTRAVENOUS | Status: AC
  Administered 2016-04-17: 12:00:00 via INTRAVENOUS

## 2016-04-17 MED ORDER — ZOLEDRONIC ACID 4 MG/100ML IV SOLN
4.0000 mg | Freq: Once | INTRAVENOUS | Status: AC
Start: 2016-04-17 — End: 2016-04-17
  Administered 2016-04-17: 4 mg via INTRAVENOUS
  Filled 2016-04-17: qty 100

## 2016-04-17 NOTE — Progress Notes (Signed)
Dean [name]. tolerated Zometa infusion well without complaints or incident Calcium 9.1 and pt denied any tooth,jaw or leg pain prior to administering Zometa. VSS upon discharge. Pt discharged self ambulatory in satisfactory condition

## 2016-04-17 NOTE — Patient Instructions (Signed)
[facility] at [facility] Discharge Instructions  RECOMMENDATIONS MADE BY THE CONSULTANT AND ANY TEST RESULTS WILL BE SENT TO YOUR REFERRING PHYSICIAN.  Received Zometa infusion today. Follow-up as scheduled. Call clinic for any questions or concerns  Thank you for choosing [facility] at [facility] to provide your oncology and hematology care.  To afford each patient quality time with our provider, please arrive at least 15 minutes before your scheduled appointment time.    If you have a lab appointment with the Cancer Center please come in thru the  Main Entrance and check in at the main information desk  You need to re-schedule your appointment should you arrive 10 or more minutes late.  We strive to give you quality time with our providers, and arriving late affects you and other patients whose appointments are after yours.  Also, if you no show three or more times for appointments you may be dismissed from the clinic at the providers discretion.     Again, thank you for choosing [facility].  Our hope is that these requests will decrease the amount of time that you wait before being seen by our physicians.       _____________________________________________________________  Should you have questions after your visit to [facility], please contact our office at [phone] between the hours of 8:30 a.m. and 4:30 p.m.  Voicemails left after 4:30 p.m. will not be returned until the following business day.  For prescription refill requests, have your pharmacy contact our office.       Resources For Cancer Patients and their Caregivers ? American Cancer Society: Can assist with transportation, wigs, general needs, runs Look Good Feel Better.        1-888-227-6333 ? Cancer Care: Provides financial assistance, online support groups, medication/co-pay assistance.  1-800-813-HOPE (4673) ? [facility] Assists [place] cancer patients and their families through emotional , educational and financial support.  336-427-4357 ? [place] DSS Where to apply for food stamps, Medicaid and utility assistance. 336-342-1394 ? RCATS: Transportation to medical appointments. 336-347-2287 ? Social Security Administration: May apply for disability if have a Stage IV cancer. 336-342-7796 1-800-772-1213 ? [place] Aging, Disability and Transit Services: Assists with nutrition, care and transit needs. 336-349-2343  Cancer Center Support Programs: @10RELATIVEDAYS@ > Cancer Support Group  2nd Tuesday of the month 1pm-2pm, Journey Room  > Creative Journey  3rd Tuesday of the month 1130am-1pm, Journey Room  > Look Good Feel Better  1st Wednesday of the month 10am-12 noon, Journey Room (Call [employer] to register 1-800-395-5775)   

## 2016-05-14 ENCOUNTER — Other Ambulatory Visit (HOSPITAL_COMMUNITY): Payer: Self-pay | Admitting: *Deleted

## 2016-05-14 DIAGNOSIS — C9000 Multiple myeloma not having achieved remission: Secondary | ICD-10-CM

## 2016-05-15 ENCOUNTER — Encounter (HOSPITAL_COMMUNITY): Payer: Medicare Other

## 2016-05-15 ENCOUNTER — Encounter (HOSPITAL_COMMUNITY): Payer: Self-pay

## 2016-05-15 ENCOUNTER — Encounter (HOSPITAL_COMMUNITY): Payer: Medicare Other | Attending: Oncology

## 2016-05-15 VITALS — BP 120/65 | HR 63 | Temp 97.6°F | Resp 18 | Wt 252.0 lb

## 2016-05-15 DIAGNOSIS — C9000 Multiple myeloma not having achieved remission: Secondary | ICD-10-CM | POA: Diagnosis not present

## 2016-05-15 LAB — BASIC METABOLIC PANEL
Anion gap: 10 (ref 5–15)
BUN: 23 mg/dL — ABNORMAL HIGH (ref 6–20)
CO2: 26 mmol/L (ref 22–32)
Calcium: 8.9 mg/dL (ref 8.9–10.3)
Chloride: 102 mmol/L (ref 101–111)
Creatinine, Ser: 0.85 mg/dL (ref 0.61–1.24)
GFR calc Af Amer: >60 mL/min (ref >60)
GFR calc non Af Amer: >60 mL/min (ref >60)
Glucose, Bld: 116 mg/dL — ABNORMAL HIGH (ref 65–99)
Potassium: 4.4 mmol/L (ref 3.5–5.1)
Sodium: 138 mmol/L (ref 135–145)

## 2016-05-15 MED ORDER — ZOLEDRONIC ACID 4 MG/100ML IV SOLN
4.0000 mg | Freq: Once | INTRAVENOUS | Status: AC
  Administered 2016-05-15: 4 mg via INTRAVENOUS
  Filled 2016-05-15: qty 100

## 2016-05-15 MED ORDER — SODIUM CHLORIDE 0.9 % IV SOLN
INTRAVENOUS | Status: DC
  Administered 2016-05-15: 12:00:00 via INTRAVENOUS

## 2016-05-15 NOTE — Patient Instructions (Signed)
[place] at [facility] Discharge Instructions  RECOMMENDATIONS MADE BY THE CONSULTANT AND ANY TEST RESULTS WILL BE SENT TO YOUR REFERRING PHYSICIAN.  Zometa today  Thank you for choosing [place] at [facility] to provide your oncology and hematology care.  To afford each patient quality time with our provider, please arrive at least 15 minutes before your scheduled appointment time.    If you have a lab appointment with the [place] please come in thru the  Main Entrance and check in at the main information desk  You need to re-schedule your appointment should you arrive 10 or more minutes late.  We strive to give you quality time with our providers, and arriving late affects you and other patients whose appointments are after yours.  Also, if you no show three or more times for appointments you may be dismissed from the clinic at the providers discretion.     Again, thank you for choosing [facility].  Our hope is that these requests will decrease the amount of time that you wait before being seen by our physicians.       _____________________________________________________________  Should you have questions after your visit to [facility], please contact our office at [phone] between the hours of 8:30 a.m. and 4:30 p.m.  Voicemails left after 4:30 p.m. will not be returned until the following business day.  For prescription refill requests, have your pharmacy contact our office.       Resources For Cancer Patients and their Caregivers ? American Cancer Society: Can assist with transportation, wigs, general needs, runs Look Good Feel Better.        762-128-7199 ? Cancer Care: Provides financial assistance, online support groups, medication/co-pay assistance.  1-800-813-HOPE 803-795-1879) ?  Assists [place] cancer patients and their families through emotional ,  educational and financial support.  830-370-6992 ? [place] DSS Where to apply for food stamps, Medicaid and utility assistance. 215-427-3485 ? RCATS: Transportation to medical appointments. 409-203-7631 ? Social Security Administration: May apply for disability if have a Stage IV cancer. 579-170-9789 [phone])722-1797 ? [employer], Disability and Transit Services: Assists with nutrition, care and transit needs. [place] Support Programs: @10RELATIVEDAYS @ > Cancer Support Group  2nd Tuesday of the month 1pm-2pm, Journey Room  > Creative Journey  3rd Tuesday of the month 1130am-1pm, Journey Room  > Look Good Feel Better  1st Wednesday of the month 10am-12 noon, Journey Room (Call Curtis to register [phone])

## 2016-05-15 NOTE — Progress Notes (Signed)
Increased BUN reported to NP, ok for Zometa infusion.  Patient tolerated infusion well.  VSS.  Patient ambulatory and stable upon discharge from clinic.  Patient instructed to get appointments for follow up prior to leaving clinic today.

## 2016-06-11 NOTE — Progress Notes (Signed)
[place], [place] [address]   CLINIC:  Medical Oncology/Hematology  PCP:  [name], MD [address] [place] [address] [phone]   REASON FOR VISIT:  Follow-up for multiple myeloma   CURRENT THERAPY: Zometa    BRIEF ONCOLOGIC HISTORY:    Multiple myeloma without remission ([place])   11/16/2012 Initial Diagnosis    L occipital condyle destructive lesion, not biopsied secondary to location. XRT      12/01/2012 Imaging    L3 superior endplate compression FX, no lytic lesions to suggest myeloma      02/06/2014 Imaging    soft tissue mass along the right posterior fifth rib with some rib destruction      02/16/2014 Miscellaneous    Normal CBC, Normal CMP, kappa,lamda ratio of 2.62 (abnormal), UIEP with slightly restricted band, IgG kappa, SPEP/IEP with monoclonal protein at 0.79 g/dl, normal igG, suppressed IGM      02/20/2014 Bone Marrow Biopsy    33% kappa restricted plasma cells Normal FISH, normal cytogenetics      02/21/2014 - 03/08/2014 Radiation Therapy    30Gy to R rib lesion, lesion not biopsied      03/16/2014 PET scan    Scattered hypermetabolic osseous lesions, including the calvarium, ribs, left scapula, sacrum, and left femoral shaft. An expansile left vertebral body lesion at L3 extends into the epidural space of the left lateral recess,       03/17/2014 - 04/07/2014 Chemotherapy    Velcade and Dexamethasone due to initial denial of Revlimid by insurance company.  Zometa monthly.      03/22/2014 Imaging    MR_ L-Spine- Enhancing lesions compatible with multiple myeloma at L2, L3, and S1-2.      04/07/2014 - 07/28/2014 Chemotherapy    RVD with Revlimid at 25 mg days 1-14.  Revlimid dose reduced to 25 mg every other day x 14 days with 7 day respite beginning on day 1 of cycle 2.      04/17/2014 Adverse Reaction    Revlimid-induced rash.  Treated with steroids.      07/28/2014 - 08/04/2014 Chemotherapy      Revlimid daily      08/04/2014 Adverse Reaction    Velcade-induced peripheral neuropathy      08/04/2014 Treatment Plan Change    D/C Velcade      08/11/2014 PET scan    Response to therapy. Improvement and resolution of foci of osseous hypermetabolism.      08/22/2014 - 08/28/2014 Chemotherapy    Revlimid 25 mg days 1-21 every 28 days      08/28/2014 Adverse Reaction    Revlimid-induced rash. Medication held.  Medrol dose Pak prescribed.      09/04/2014 - 01/08/2015 Chemotherapy    Revlimid 10 mg every other day, without dexamethasone      01/08/2015 - 01/15/2015 Hospital Admission    Colonic diverticular abscess with IR drain placement      03/02/2015 PET scan    Only bony uptake is low activity at site of deformity/callus at R second rib FX, high activity in sigmoid colon, advanced sigmoid divertidulosis. Abscess not resolved?      06/04/2015 Imaging    CT nonobstructive L renal calculus, diverticulosis of descending and sigmoid colon without inflammation, moderate prostatic enlargement      07/12/2015 Surgery    Diverticulitis s/p robotic sigmoid colectomy with Dr. Johney Maine      08/23/2015 PET scan  Primarily similar hypermetabolic osseous foci within the R sided ribs. new L third rib focus of hypermetab and sclerosis is favored to be related to healing FX. no soft tissue myeloma id'd. Presumed postop hypermetab and edema about the R pelvic wall       03/06/2016 PET scan    1. Reduced activity in the prior lesion such as the right second and fifth rib lesions, activity nearly completely resolved and significantly less than added mediastinal blood pool activity. No new lesions are identified. 2. Other imaging findings of potential clinical significance: Coronary, aortic arch, and branch vessel atherosclerotic vascular disease. Aortoiliac atherosclerotic vascular disease. Enlarged prostate gland. Colonic diverticula.        HISTORY OF PRESENT ILLNESS:  (From Dr.  Donald Pore last note dated 01/22/16)    INTERVAL HISTORY:  Dean [name] reports to cancer center for follow-up of multiple myeloma and consideration for next dose of Zometa.   His biggest complaint today is continued arthralgias and muscle cramps.  He reports that he has had decreased neck ROM, which is frustrating for him.  Reports feeling "weak" after taking his medications, particularly the gabapentin.  His chronic peripheral neuropathy is stable, but worsens when he is on his feet for a long time.  His ankles swell.  He takes tonic water for myalgias, which is very helpful. "There's no amount of money in the world I wouldn't pay for that stuff (tonic water); it's a godsend."   Reports that he is taking his calcium and vitamin D as directed.    Also reports dysuria for the past several days. Denies fever/chills or night sweats.     REVIEW OF SYSTEMS:  Review of Systems  Constitutional: Positive for fatigue. Negative for chills, diaphoresis and fever.  HENT:  Negative.  Negative for lump/mass.   Eyes: Negative.   Respiratory: Negative.  Negative for cough and shortness of breath.   Cardiovascular: Positive for leg swelling.  Gastrointestinal: Negative.  Negative for abdominal pain, blood in stool, constipation, diarrhea, nausea and vomiting.  Endocrine: Negative.   Genitourinary: Positive for dysuria.   Musculoskeletal: Positive for arthralgias, myalgias and neck pain.  Skin: Negative.  Negative for rash.  Neurological: Positive for numbness (chronic neuropathy; on gabapentin).  Hematological: Negative.   Psychiatric/Behavioral: Positive for sleep disturbance (takes medication for sleep).     PAST MEDICAL/SURGICAL HISTORY:  Past Medical History:  Diagnosis Date  . BPH (benign prostatic hyperplasia)   . Colonic diverticular abscess 01/08/2015  . Diverticulitis 16/07/37   complicated by abscess and required percutaneous drainage  . GERD (gastroesophageal reflux disease)   . H/O  ETOH abuse   . History of chemotherapy last done jan 2017  . History of radiation therapy 01/05/13-02/10/13   45 gray to left occipital condyle region  . Intra-abdominal abscess ([place])   . Multiple myeloma ([place]) 2015  . Neuropathy   . Radiation 02/21/14-03/08/14   right posterior chest wall area 30 gray  . Radiation 04/19/14-05/02/14   lumbar spine 25 gray  . Skull lesion    Left occipital condyle  . Wrist fracture, left    x 2   Past Surgical History:  Procedure Laterality Date  . COLONOSCOPY N/A 04/19/2015   Procedure: COLONOSCOPY;  Surgeon: Danie Binder, MD;  Location: [facility];  Service: Endoscopy;  Laterality: N/A;  1:30 PM  . CYST EXCISION  1959   tail bone     SOCIAL HISTORY:  Social History   Social History  . Marital status:  Married    Spouse name: N/A  . Number of children: 3  . Years of education: N/A   Occupational History  .  Retired   Social History Main Topics  . Smoking status: Never Smoker  . Smokeless tobacco: Never Used  . Alcohol use No     Comment: " Not much no more"  . Drug use: No  . Sexual activity: Not on file   Other Topics Concern  . Not on file   Social History Narrative  . No narrative on file    FAMILY HISTORY:  Family History  Problem Relation Age of Onset  . Diabetes Mother   . Stroke Mother   . Hypertension Father     CURRENT MEDICATIONS:  Outpatient Encounter Prescriptions as of 06/12/2016  Medication Sig Note  . ALPRAZolam (XANAX) 1 MG tablet Take 2 tablets (2 mg total) by mouth at bedtime as needed for sleep. For anxiety and/or sleep   . aspirin EC 325 MG tablet Take 1 tablet (325 mg total) by mouth daily.   . Calcium Carb-Cholecalciferol (CALCIUM-VITAMIN D3) 600-500 MG-UNIT CAPS Take 1 tablet by mouth 2 (two) times daily. With food (Patient taking differently: Take 1-2 tablets by mouth 3 (three) times daily with meals. Takes two tablets in the morning, one tablet in the afternoon and one tablet at night.)   .  furosemide (LASIX) 20 MG tablet Take 1 tablet (20 mg total) by mouth daily. (Patient taking differently: Take 20 mg by mouth daily as needed for fluid or edema. )   . gabapentin (NEURONTIN) 400 MG capsule TAKE 5 CAPSULES (2,000 MG TOTAL) BY MOUTH DAILY.   Marland Kitchen omeprazole (PRILOSEC) 20 MG capsule Take 1 capsule (20 mg total) by mouth daily.   Marland Kitchen oxyCODONE (OXY IR/ROXICODONE) 5 MG immediate release tablet Take 1-2 tablets (5-10 mg total) by mouth every 6 (six) hours as needed for moderate pain, severe pain or breakthrough pain.   . tamsulosin (FLOMAX) 0.4 MG CAPS capsule Take 1 capsule (0.4 mg total) by mouth at bedtime.   Marland Kitchen zolendronic acid (ZOMETA) 4 MG/5ML injection Inject 4 mg into the vein every 28 (twenty-eight) days.  06/20/2015: He [facility] doses at [facility]. His last dose 06/14/15.  Marland Kitchen EPINEPHrine 0.3 mg/0.3 mL IJ SOAJ injection Inject 0.3 mLs (0.3 mg total) into the muscle once. (Patient not taking: Reported on 05/15/2016)    Facility-Administered Encounter Medications as of 06/12/2016  Medication  . sodium chloride 0.9 % injection 10 mL    ALLERGIES:  Allergies  Allergen Reactions  . Codeine Other (See Comments)    Severe headache  . Morphine And Related Other (See Comments)    Makes him feel weird  . Revlimid [Lenalidomide] Other (See Comments)    "Causes me to become weak"     PHYSICAL EXAM:  ECOG Performance status: 1 - Symptomatic, but independent.   Vitals:   06/12/16 1059  BP: 137/63  Pulse: 66  Resp: 18  Temp: 98.9 F (37.2 C)   Filed Weights   06/12/16 1059  Weight: 253 lb (114.8 kg)     Physical Exam  Constitutional: He is oriented to person, place, and time and well-developed, well-nourished, and in no distress.  -Seen seated in chemo chair in infusion area.  -Wears sunglasses d/t light sensitivity  HENT:  Head: Normocephalic.  Mouth/Throat: Oropharynx is clear and moist.  Eyes: Conjunctivae are normal. No scleral icterus.  Neck: Normal range of motion.  Neck supple.  Cardiovascular: Normal rate and  regular rhythm.   Pulmonary/Chest: Effort normal. No respiratory distress. He has wheezes.  Expiratory wheezes  Abdominal: Soft. Bowel sounds are normal. There is no tenderness. There is no rebound and no guarding.  Musculoskeletal: Normal range of motion. He exhibits edema (1+ BLE/pedal edema).  Lymphadenopathy:    He has no cervical adenopathy.       Right: No supraclavicular adenopathy present.       Left: No supraclavicular adenopathy present.  Neurological: He is alert and oriented to person, place, and time. No cranial nerve deficit.  Skin: Skin is warm and dry. No rash noted.  Psychiatric: Mood, memory, affect and judgment normal.  Nursing note and vitals reviewed.    LABORATORY DATA:  I have reviewed the labs as listed.  CBC    Component Value Date/Time   WBC 7.2 06/12/2016 1021   RBC 5.05 06/12/2016 1021   HGB 15.3 06/12/2016 1021   HCT 45.6 06/12/2016 1021   PLT 141 (L) 06/12/2016 1021   MCV 90.3 06/12/2016 1021   MCH 30.3 06/12/2016 1021   MCHC 33.6 06/12/2016 1021   RDW 13.5 06/12/2016 1021   LYMPHSABS 1.5 06/12/2016 1021   MONOABS 0.7 06/12/2016 1021   EOSABS 0.1 06/12/2016 1021   BASOSABS 0.0 06/12/2016 1021   CMP Latest Ref Rng & Units 06/12/2016 05/15/2016 04/16/2016  Glucose 65 - 99 mg/dL 107(H) 116(H) 110(H)  BUN 6 - 20 mg/dL 19 23(H) 23(H)  Creatinine 0.61 - 1.24 mg/dL 0.96 0.85 0.96  Sodium 135 - 145 mmol/L 140 138 139  Potassium 3.5 - 5.1 mmol/L 5.0 4.4 4.3  Chloride 101 - 111 mmol/L 105 102 105  CO2 22 - 32 mmol/L 30 26 28   Calcium 8.9 - 10.3 mg/dL 8.7(L) 8.9 9.1  Total Protein 6.5 - 8.1 g/dL 6.5 - 6.5  Total Bilirubin 0.3 - 1.2 mg/dL 0.3 - 0.5  Alkaline Phos 38 - 126 U/L 28(L) - 23(L)  AST 15 - 41 U/L 20 - 14(L)  ALT 17 - 63 U/L 16(L) - 13(L)      PENDING LABS:  Myeloma labs pending    DIAGNOSTIC IMAGING:  PET scan: 03/06/16      PATHOLOGY:  Bone marrow biopsy: 02/20/14   Cytogenetics:  02/20/14    ASSESSMENT & PLAN:   IgG kappa multiple myeloma:  -Most recent PET scan in 03/06/16 with stable findings.  -Will continue to monitor his myeloma labs. Last M-spike was stable at 0.2 in 03/2016.  Clinically, he did not previously tolerate chemotherapy treatment well and thus we will continue to monitor. If M spike begins to trends upward, then we may need to consider restarting therapy.     -Zometa due today to help reduce skeletal related events; labs reviewed and calcium is too low to receive Zometa dose today; calcium 8.7 mg/dL with normal albumin. Therefore, Zometa held today. -Return to cancer center in 1 month for consideration of next dose of Zometa.     Hypocalcemia:  -Possibly due to vitamin D deficiency.  -Recommended he increase his vitamin D supplementation to 2000 mg/day (previously was taking about 1500 mg). Maintain calcium supplementation at 1200 IU per day.  -Will add on serum vitamin D to labs next month, as vitamin D deficiency can contribute to hypocalcemia; orders placed today.   Dysuria:  -Will collect UA and culture to evaluate for UTI given reported dysuria.  -We will contact patient with results when they are available.   Myalgias/Arthralgias/Peripheral neuropathy:  -Continue using tonic water with  quinine for myalgias, as this has been more effective for patient than pharmacologic options.  -Continue Gabapentin for peripheral neuropathy.  -Increase physical activity and stretching, as tolerated, for arthralgias. Continue oxycodone.       Dispo:  -Continue monthly Zometa.  -Return to cancer center in 3 months with labs (CBC with diff, CMET, SPEP, IFE, kappa/lambda light chains, IgG/IgA/IgM, & vitamin D); orders placed today.     All questions were answered to patient's stated satisfaction. Encouraged patient to call with any new concerns or questions before his next visit to the cancer center and we can certain see him sooner, if needed.       Orders placed this encounter:  Orders Placed This Encounter  Procedures  . Urine culture  . Urinalysis, Routine w reflex microscopic  . VITAMIN D 25 Hydroxy (Vit-D Deficiency, Fractures)  . CBC with Differential/Platelet  . Comprehensive metabolic panel  . Immunofixation electrophoresis  . IgG, IgA, IgM  . Kappa/lambda light chains  . Protein electrophoresis, serum  . VITAMIN D 25 Hydroxy (Vit-D Deficiency, Fractures)     Dean Craze, NP [place] [phone]

## 2016-06-12 ENCOUNTER — Encounter (HOSPITAL_COMMUNITY): Payer: Medicare Other

## 2016-06-12 ENCOUNTER — Encounter (HOSPITAL_COMMUNITY): Payer: Self-pay | Admitting: Adult Health

## 2016-06-12 ENCOUNTER — Ambulatory Visit (HOSPITAL_COMMUNITY): Payer: Medicare Other

## 2016-06-12 ENCOUNTER — Other Ambulatory Visit (HOSPITAL_COMMUNITY): Payer: Medicare Other

## 2016-06-12 ENCOUNTER — Encounter (HOSPITAL_COMMUNITY): Payer: Medicare Other | Attending: Adult Health | Admitting: Adult Health

## 2016-06-12 VITALS — BP 137/63 | HR 66 | Temp 98.9°F | Resp 18 | Wt 253.0 lb

## 2016-06-12 DIAGNOSIS — M791 Myalgia: Secondary | ICD-10-CM

## 2016-06-12 DIAGNOSIS — R300 Dysuria: Secondary | ICD-10-CM | POA: Insufficient documentation

## 2016-06-12 DIAGNOSIS — G629 Polyneuropathy, unspecified: Secondary | ICD-10-CM

## 2016-06-12 DIAGNOSIS — M2550 Pain in unspecified joint: Secondary | ICD-10-CM

## 2016-06-12 DIAGNOSIS — C9000 Multiple myeloma not having achieved remission: Secondary | ICD-10-CM | POA: Insufficient documentation

## 2016-06-12 LAB — COMPREHENSIVE METABOLIC PANEL
ALT: 16 U/L — ABNORMAL LOW (ref 17–63)
AST: 20 U/L (ref 15–41)
Albumin: 3.6 g/dL (ref 3.5–5.0)
Alkaline Phosphatase: 28 U/L — ABNORMAL LOW (ref 38–126)
Anion gap: 5 (ref 5–15)
BUN: 19 mg/dL (ref 6–20)
CO2: 30 mmol/L (ref 22–32)
Calcium: 8.7 mg/dL — ABNORMAL LOW (ref 8.9–10.3)
Chloride: 105 mmol/L (ref 101–111)
Creatinine, Ser: 0.96 mg/dL (ref 0.61–1.24)
GFR calc Af Amer: >60 mL/min (ref >60)
GFR calc non Af Amer: >60 mL/min (ref >60)
Glucose, Bld: 107 mg/dL — ABNORMAL HIGH (ref 65–99)
Potassium: 5 mmol/L (ref 3.5–5.1)
Sodium: 140 mmol/L (ref 135–145)
Total Bilirubin: 0.3 mg/dL (ref 0.3–1.2)
Total Protein: 6.5 g/dL (ref 6.5–8.1)

## 2016-06-12 LAB — CBC WITH DIFFERENTIAL/PLATELET
Basophils Absolute: 0 10*3/uL (ref 0.0–0.1)
Basophils Relative: 0 %
Eosinophils Absolute: 0.1 10*3/uL (ref 0.0–0.7)
Eosinophils Relative: 1 %
HCT: 45.6 % (ref 39.0–52.0)
Hemoglobin: 15.3 g/dL (ref 13.0–17.0)
Lymphocytes Relative: 21 %
Lymphs Abs: 1.5 10*3/uL (ref 0.7–4.0)
MCH: 30.3 pg (ref 26.0–34.0)
MCHC: 33.6 g/dL (ref 30.0–36.0)
MCV: 90.3 fL (ref 78.0–100.0)
Monocytes Absolute: 0.7 10*3/uL (ref 0.1–1.0)
Monocytes Relative: 10 %
Neutro Abs: 5 10*3/uL (ref 1.7–7.7)
Neutrophils Relative %: 68 %
Platelets: 141 10*3/uL — ABNORMAL LOW (ref 150–400)
RBC: 5.05 MIL/uL (ref 4.22–5.81)
RDW: 13.5 % (ref 11.5–15.5)
WBC: 7.2 10*3/uL (ref 4.0–10.5)

## 2016-06-12 LAB — URINALYSIS, ROUTINE W REFLEX MICROSCOPIC
Bilirubin Urine: NEGATIVE
Glucose, UA: NEGATIVE mg/dL
Ketones, ur: NEGATIVE mg/dL
Nitrite: NEGATIVE
Protein, ur: 30 mg/dL — AB
Specific Gravity, Urine: 1.015 (ref 1.005–1.030)
Squamous Epithelial / LPF: NONE SEEN
pH: 5 (ref 5.0–8.0)

## 2016-06-12 NOTE — Patient Instructions (Addendum)
[place] at [facility] Discharge Instructions  RECOMMENDATIONS MADE BY THE CONSULTANT AND ANY TEST RESULTS WILL BE SENT TO YOUR REFERRING PHYSICIAN.  You were seen today by Mike Craze NP. No treatment today. You should be taking 1200mg  of Calcium daily and 2000units of Vitamin D daily. Continue monthly labs and infusion. Return in 3 months for follow up, labs and infusion.     Thank you for choosing [place] at [facility] to provide your oncology and hematology care.  To afford each patient quality time with our provider, please arrive at least 15 minutes before your scheduled appointment time.    If you have a lab appointment with the [place] please come in thru the  Main Entrance and check in at the main information desk  You need to re-schedule your appointment should you arrive 10 or more minutes late.  We strive to give you quality time with our providers, and arriving late affects you and other patients whose appointments are after yours.  Also, if you no show three or more times for appointments you may be dismissed from the clinic at the providers discretion.     Again, thank you for choosing [facility].  Our hope is that these requests will decrease the amount of time that you wait before being seen by our physicians.       _____________________________________________________________  Should you have questions after your visit to [facility], please contact our office at [phone] between the hours of 8:30 a.m. and 4:30 p.m.  Voicemails left after 4:30 p.m. will not be returned until the following business day.  For prescription refill requests, have your pharmacy contact our office.       Resources For Cancer Patients and their Caregivers ? American Cancer Society: Can assist with transportation, wigs, general needs, runs Look Good Feel Better.        ([phone] [phone] ? Cancer  Care: Provides financial assistance, online support groups, medication/co-pay assistance.  1-800-813-HOPE 2140251432) ? [place] Assists [place] cancer patients and their families through emotional , educational and financial support.  862 566 4575 ? [place] DSS Where to apply for food stamps, Medicaid and utility assistance. 661-577-1933 ? RCATS: Transportation to medical appointments. 220-165-7356 ? Social Security Administration: May apply for disability if have a Stage IV cancer. [phone] 682-530-8700 ? [employer], Disability and Transit Services: Assists with nutrition, care and transit needs. [place] Support Programs: @10RELATIVEDAYS @ > Cancer Support Group  2nd Tuesday of the month 1pm-2pm, Journey Room  > Creative Journey  3rd Tuesday of the month 1130am-1pm, Journey Room  > Look Good Feel Better  1st Wednesday of the month 10am-12 noon, Journey Room (Call Daniels to register [phone])

## 2016-06-12 NOTE — Progress Notes (Signed)
Labs including Calcium of 8.7 reviewed with Dr. Talbert Cage and pharmacist and pt's Zometa to be held today. Pt instructed to continue to take his Calcium BID and verbalized understanding.

## 2016-06-13 ENCOUNTER — Other Ambulatory Visit (HOSPITAL_COMMUNITY): Payer: Medicare Other

## 2016-06-13 ENCOUNTER — Ambulatory Visit (HOSPITAL_COMMUNITY): Payer: Medicare Other | Admitting: Adult Health

## 2016-06-13 LAB — PROTEIN ELECTROPHORESIS, SERUM
A/G Ratio: 1.3 (ref 0.7–1.7)
Albumin ELP: 3.4 g/dL (ref 2.9–4.4)
Alpha-1-Globulin: 0.3 g/dL (ref 0.0–0.4)
Alpha-2-Globulin: 0.8 g/dL (ref 0.4–1.0)
Beta Globulin: 1 g/dL (ref 0.7–1.3)
Gamma Globulin: 0.7 g/dL (ref 0.4–1.8)
Globulin, Total: 2.7 g/dL (ref 2.2–3.9)
M-Spike, %: 0.3 g/dL — ABNORMAL HIGH
Total Protein ELP: 6.1 g/dL (ref 6.0–8.5)

## 2016-06-13 LAB — IGG, IGA, IGM
IgA: 159 mg/dL (ref 61–437)
IgG (Immunoglobin G), Serum: 654 mg/dL — ABNORMAL LOW (ref 700–1600)
IgM, Serum: 40 mg/dL (ref 15–143)

## 2016-06-15 LAB — URINE CULTURE: Culture: >=100000 — AB

## 2016-06-16 ENCOUNTER — Other Ambulatory Visit (HOSPITAL_COMMUNITY): Payer: Self-pay | Admitting: Adult Health

## 2016-06-16 DIAGNOSIS — N390 Urinary tract infection, site not specified: Secondary | ICD-10-CM

## 2016-06-16 DIAGNOSIS — C9000 Multiple myeloma not having achieved remission: Secondary | ICD-10-CM

## 2016-06-16 MED ORDER — NITROFURANTOIN MONOHYD MACRO 100 MG PO CAPS: 100.0000 mg | ORAL_CAPSULE | Freq: Two times a day (BID) | ORAL | 0 refills | Status: DC

## 2016-06-17 LAB — IMMUNOFIXATION ELECTROPHORESIS
IgA: 153 mg/dL (ref 61–437)
IgG (Immunoglobin G), Serum: 662 mg/dL — ABNORMAL LOW (ref 700–1600)
IgM, Serum: 42 mg/dL (ref 15–143)
Total Protein ELP: 6.1 g/dL (ref 6.0–8.5)

## 2016-06-27 IMAGING — DX DG HAND COMPLETE 3+V*R*
3 series · 3 of 3 positions shown · non-contrast
Comparison: None.

CLINICAL DATA: RIGHT hand pain and swelling for several days. No
reported injury.

EXAM:
RIGHT HAND - COMPLETE 3+ VIEW

[hand pa]
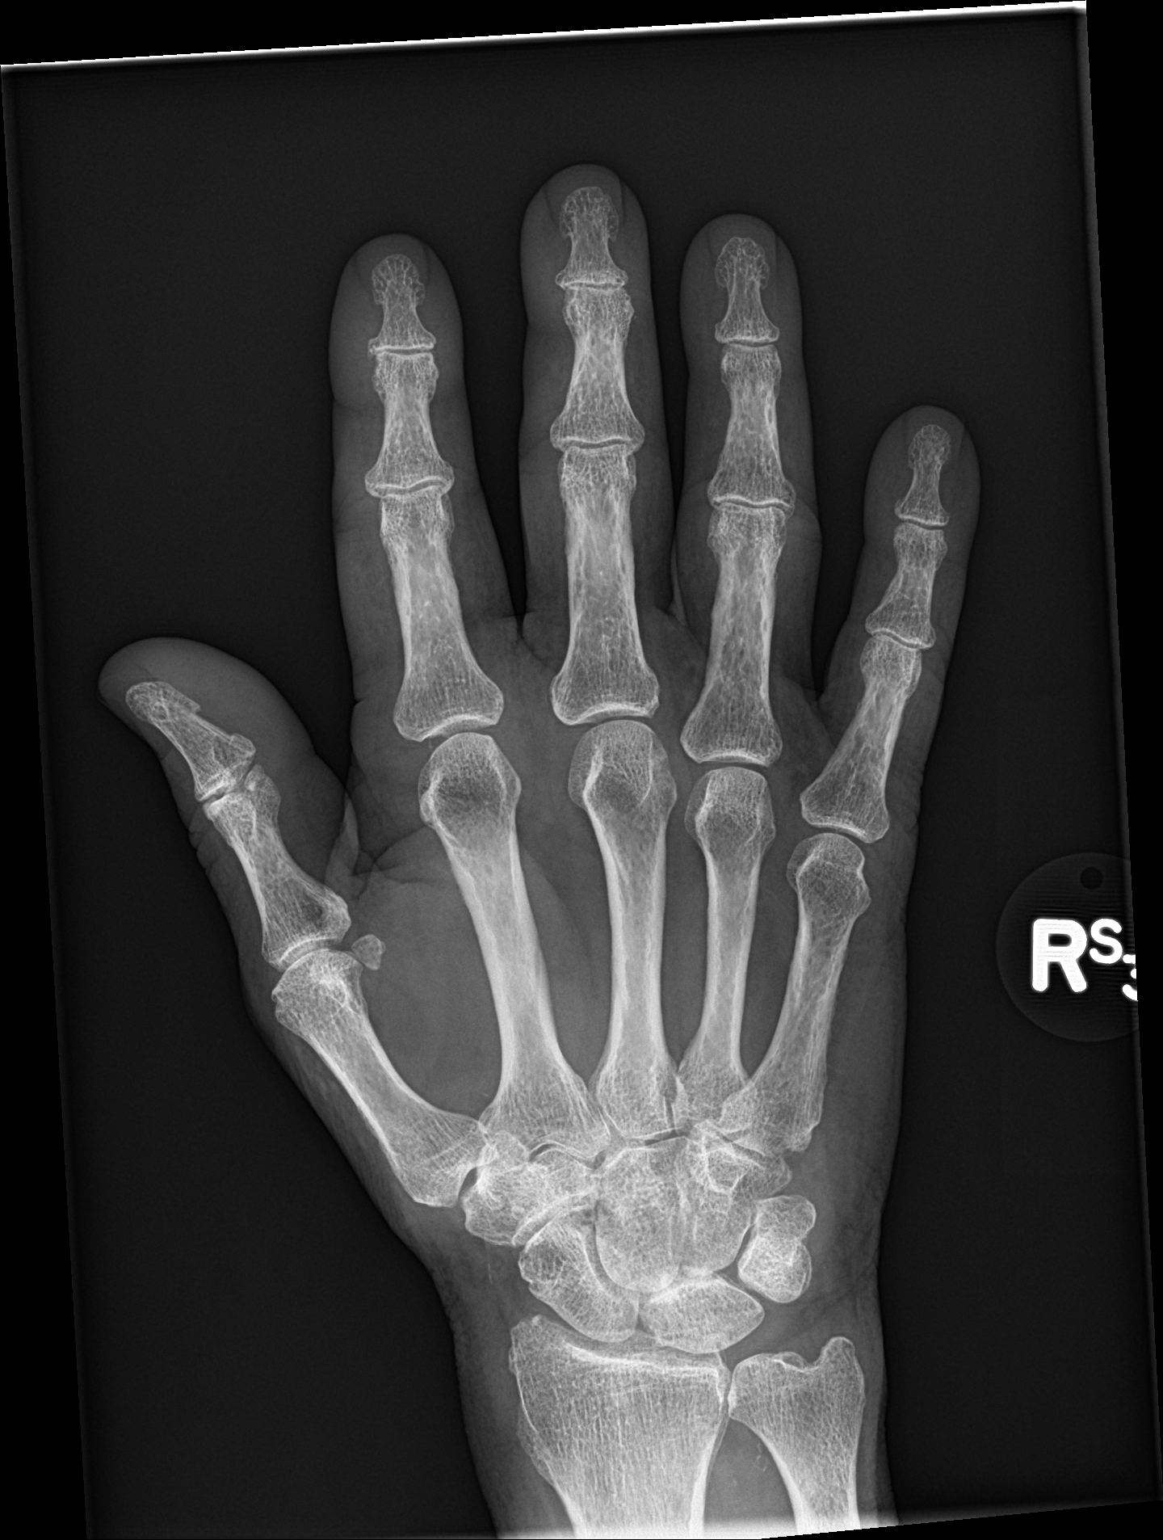

[hand obl]
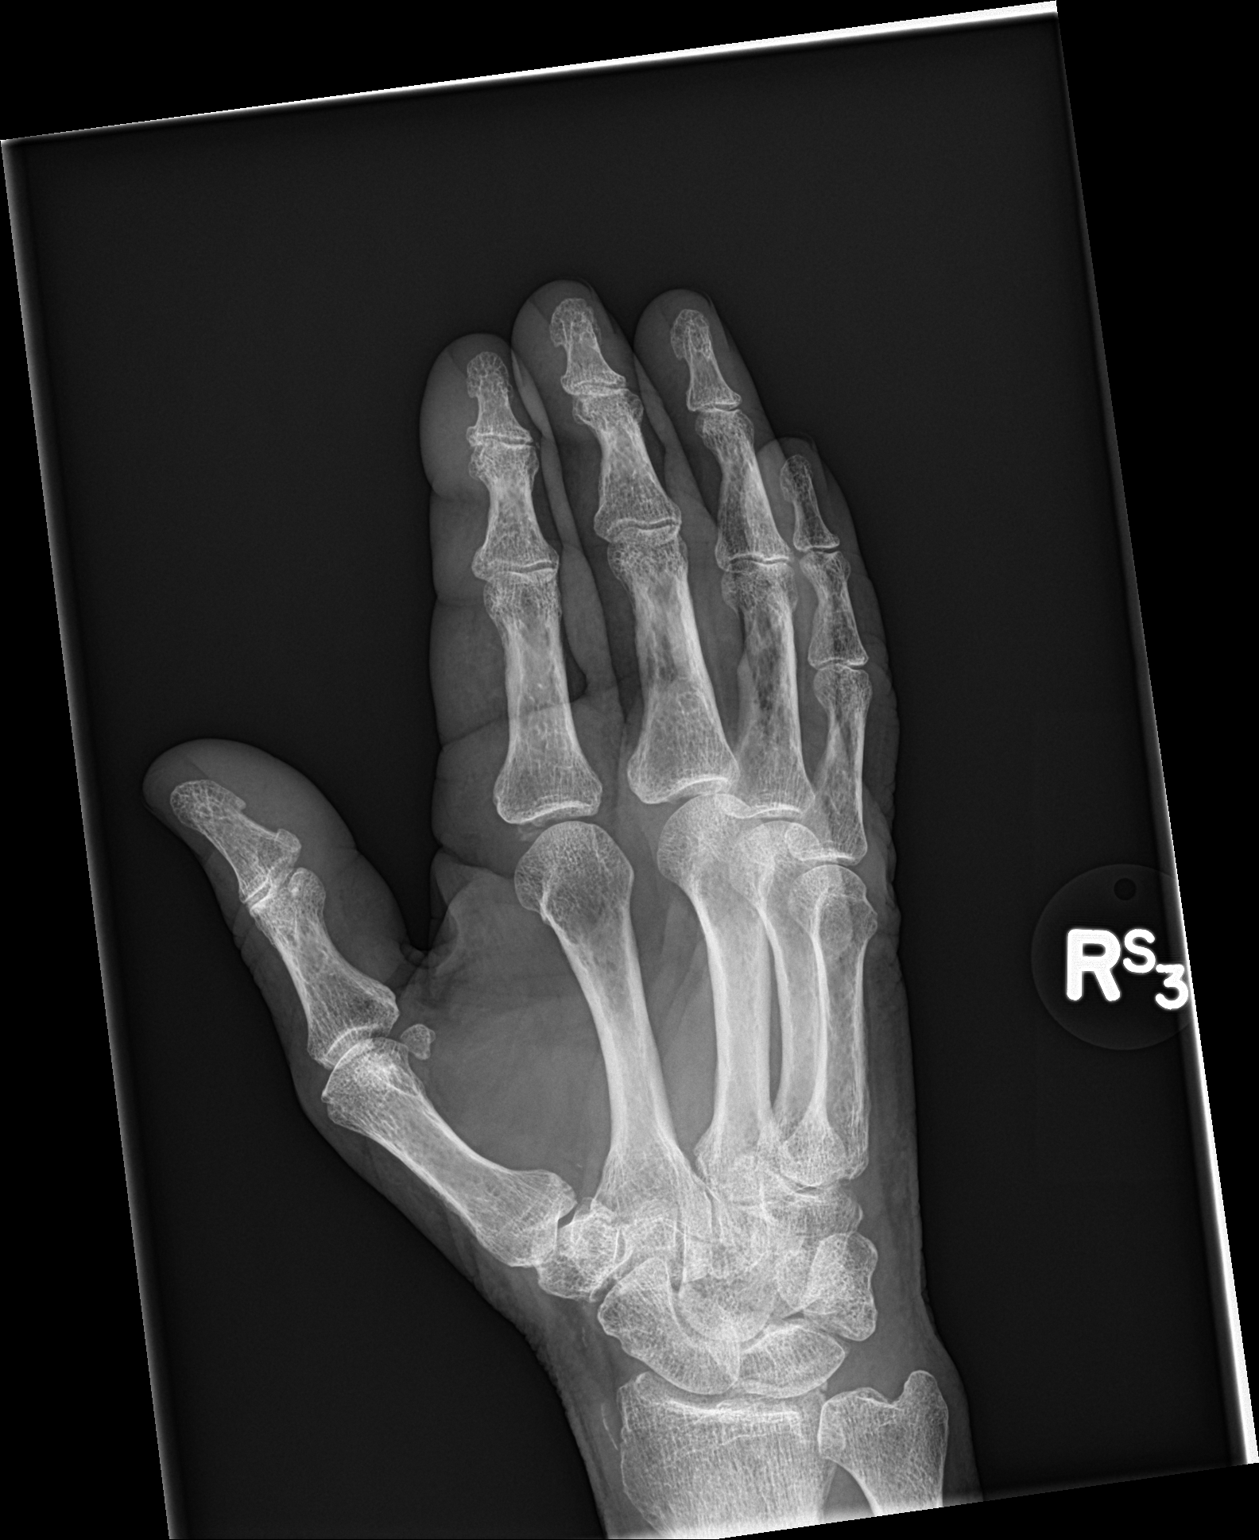

[hand lat]
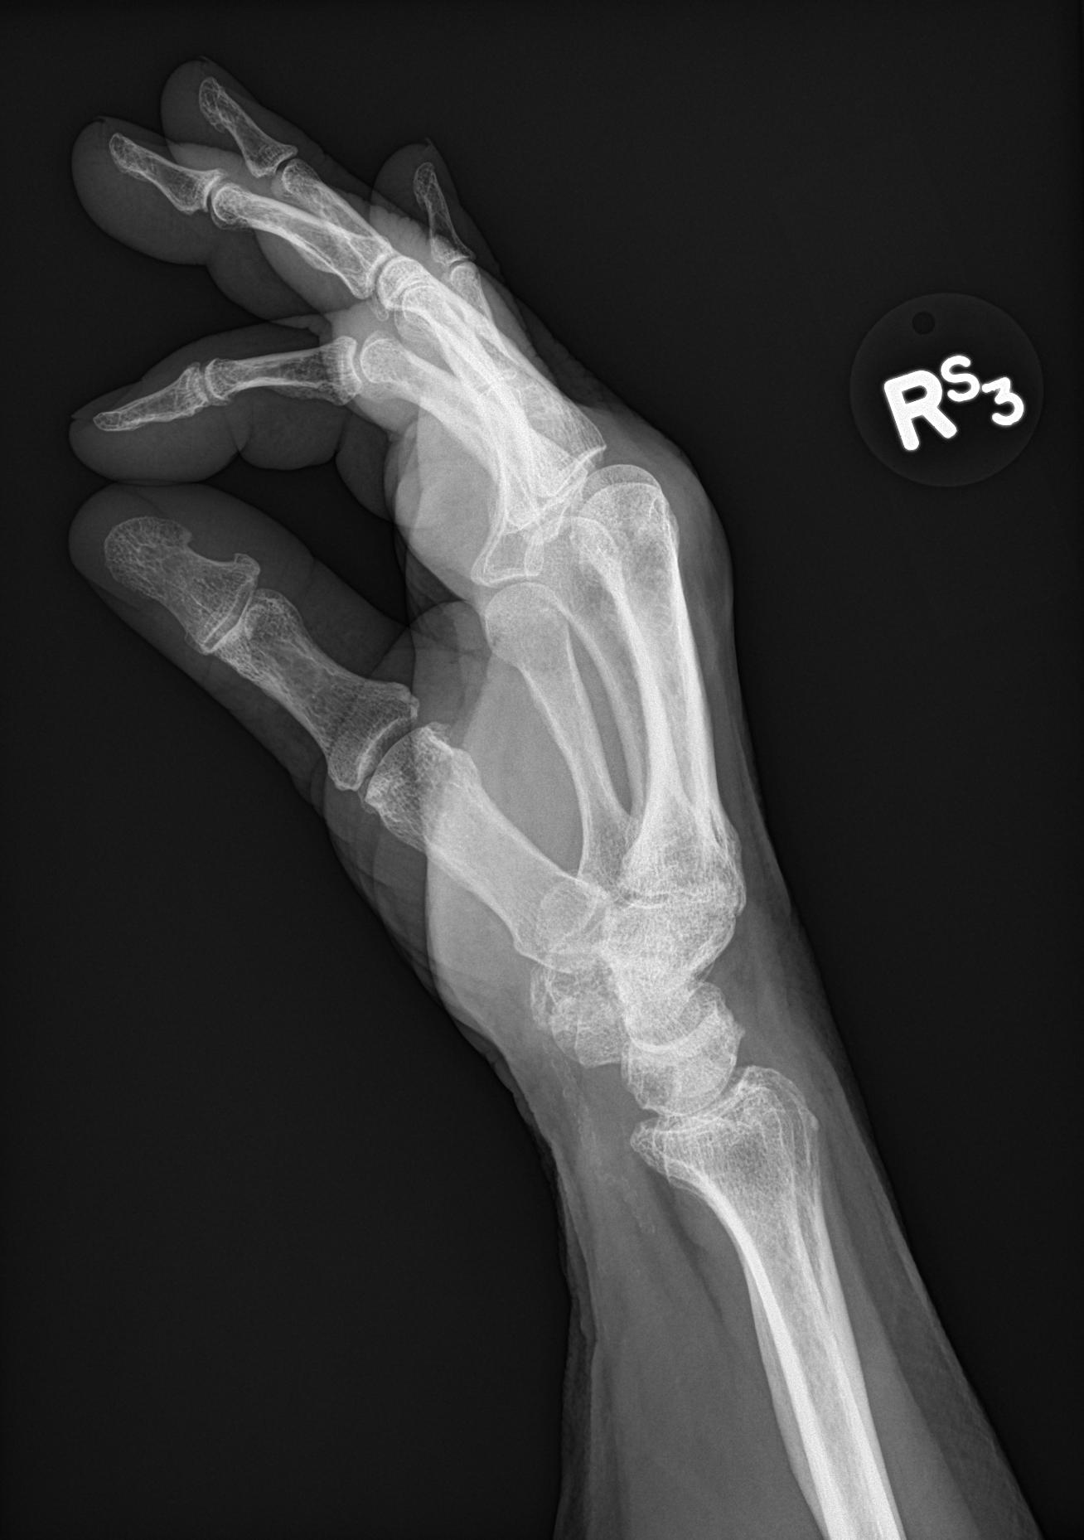

[3 of 3 positions shown; findings below may reference images not displayed]

FINDINGS: There is no evidence of fracture or dislocation. There is no
evidence of arthropathy or other focal bone abnormality. Mild
diffuse soft tissue swelling without visible foreign body or
laceration.
IMPRESSION: No osseous abnormality.  Mild diffuse soft tissue swelling.

## 2016-07-10 ENCOUNTER — Encounter (HOSPITAL_COMMUNITY): Payer: Medicare Other

## 2016-07-10 ENCOUNTER — Encounter (HOSPITAL_COMMUNITY): Payer: Medicare Other | Attending: Oncology

## 2016-07-10 DIAGNOSIS — C9000 Multiple myeloma not having achieved remission: Secondary | ICD-10-CM | POA: Insufficient documentation

## 2016-07-10 LAB — HEPATIC FUNCTION PANEL
ALT: 16 U/L — ABNORMAL LOW (ref 17–63)
AST: 19 U/L (ref 15–41)
Albumin: 3.5 g/dL (ref 3.5–5.0)
Alkaline Phosphatase: 21 U/L — ABNORMAL LOW (ref 38–126)
Bilirubin, Direct: 0.1 mg/dL (ref 0.1–0.5)
Indirect Bilirubin: 0.6 mg/dL (ref 0.3–0.9)
Total Bilirubin: 0.7 mg/dL (ref 0.3–1.2)
Total Protein: 6.2 g/dL — ABNORMAL LOW (ref 6.5–8.1)

## 2016-07-10 LAB — BASIC METABOLIC PANEL
Anion gap: 7 (ref 5–15)
BUN: 17 mg/dL (ref 6–20)
CO2: 25 mmol/L (ref 22–32)
Calcium: 8.5 mg/dL — ABNORMAL LOW (ref 8.9–10.3)
Chloride: 107 mmol/L (ref 101–111)
Creatinine, Ser: 0.83 mg/dL (ref 0.61–1.24)
GFR calc Af Amer: >60 mL/min (ref >60)
GFR calc non Af Amer: >60 mL/min (ref >60)
Glucose, Bld: 106 mg/dL — ABNORMAL HIGH (ref 65–99)
Potassium: 4.2 mmol/L (ref 3.5–5.1)
Sodium: 139 mmol/L (ref 135–145)

## 2016-07-10 NOTE — Progress Notes (Signed)
Zometa infusion held today due to 8.5 calcium.  Will return in 2 weeks for repeat labs and possible Zometa infusion.  Pt states he is taking calcium + D bid as instructed.

## 2016-07-11 LAB — VITAMIN D 25 HYDROXY (VIT D DEFICIENCY, FRACTURES): Vit D, 25-Hydroxy: 47.6 ng/mL (ref 30.0–100.0)

## 2016-07-24 ENCOUNTER — Encounter (HOSPITAL_COMMUNITY): Payer: Medicare Other

## 2016-07-24 DIAGNOSIS — C9000 Multiple myeloma not having achieved remission: Secondary | ICD-10-CM

## 2016-07-24 LAB — BASIC METABOLIC PANEL
Anion gap: 8 (ref 5–15)
BUN: 21 mg/dL — ABNORMAL HIGH (ref 6–20)
CO2: 24 mmol/L (ref 22–32)
Calcium: 8.6 mg/dL — ABNORMAL LOW (ref 8.9–10.3)
Chloride: 107 mmol/L (ref 101–111)
Creatinine, Ser: 0.77 mg/dL (ref 0.61–1.24)
GFR calc Af Amer: >60 mL/min (ref >60)
GFR calc non Af Amer: >60 mL/min (ref >60)
Glucose, Bld: 99 mg/dL (ref 65–99)
Potassium: 4.4 mmol/L (ref 3.5–5.1)
Sodium: 139 mmol/L (ref 135–145)

## 2016-07-25 NOTE — Progress Notes (Signed)
Zometa not given due to low calcium per Dr. Talbert Cage. Letter given to patient to present to dentist regarding the fact that the patient is on Zometa. Patient is taking Calcium 1500mg  a day and vit d as prescribed. Patient to return in 1 month for Zometa/labs. Patient verbalized understanding of all instructions.

## 2016-08-04 ENCOUNTER — Other Ambulatory Visit (HOSPITAL_COMMUNITY): Payer: Self-pay | Admitting: Oncology

## 2016-08-04 ENCOUNTER — Other Ambulatory Visit (HOSPITAL_COMMUNITY): Payer: Self-pay | Admitting: Adult Health

## 2016-08-04 DIAGNOSIS — N400 Enlarged prostate without lower urinary tract symptoms: Secondary | ICD-10-CM

## 2016-08-04 DIAGNOSIS — G629 Polyneuropathy, unspecified: Secondary | ICD-10-CM

## 2016-08-04 DIAGNOSIS — C9000 Multiple myeloma not having achieved remission: Secondary | ICD-10-CM

## 2016-08-05 ENCOUNTER — Other Ambulatory Visit (HOSPITAL_COMMUNITY): Payer: Self-pay | Admitting: Oncology

## 2016-08-05 NOTE — Telephone Encounter (Signed)
Yes. OK to fill.

## 2016-08-05 NOTE — Telephone Encounter (Signed)
Ok to fill 

## 2016-08-07 ENCOUNTER — Ambulatory Visit (HOSPITAL_COMMUNITY): Payer: Medicare Other

## 2016-08-07 ENCOUNTER — Other Ambulatory Visit (HOSPITAL_COMMUNITY): Payer: Medicare Other

## 2016-08-21 ENCOUNTER — Encounter (HOSPITAL_COMMUNITY): Payer: Medicare Other

## 2016-08-21 ENCOUNTER — Encounter (HOSPITAL_COMMUNITY): Payer: Medicare Other | Attending: Oncology

## 2016-08-21 VITALS — BP 146/90 | HR 64 | Temp 97.9°F | Resp 18

## 2016-08-21 DIAGNOSIS — C9000 Multiple myeloma not having achieved remission: Secondary | ICD-10-CM | POA: Insufficient documentation

## 2016-08-21 DIAGNOSIS — R300 Dysuria: Secondary | ICD-10-CM

## 2016-08-21 DIAGNOSIS — K219 Gastro-esophageal reflux disease without esophagitis: Secondary | ICD-10-CM

## 2016-08-21 LAB — BASIC METABOLIC PANEL
Anion gap: 7 (ref 5–15)
BUN: 18 mg/dL (ref 6–20)
CO2: 24 mmol/L (ref 22–32)
Calcium: 8.7 mg/dL — ABNORMAL LOW (ref 8.9–10.3)
Chloride: 105 mmol/L (ref 101–111)
Creatinine, Ser: 1.05 mg/dL (ref 0.61–1.24)
GFR calc Af Amer: >60 mL/min (ref >60)
GFR calc non Af Amer: >60 mL/min (ref >60)
Glucose, Bld: 100 mg/dL — ABNORMAL HIGH (ref 65–99)
Potassium: 4.3 mmol/L (ref 3.5–5.1)
Sodium: 136 mmol/L (ref 135–145)

## 2016-08-21 LAB — ALBUMIN: Albumin: 3.7 g/dL (ref 3.5–5.0)

## 2016-08-21 LAB — URINALYSIS, ROUTINE W REFLEX MICROSCOPIC
Bilirubin Urine: NEGATIVE
Glucose, UA: NEGATIVE mg/dL
Hgb urine dipstick: NEGATIVE
Ketones, ur: NEGATIVE mg/dL
Leukocytes, UA: NEGATIVE
Nitrite: NEGATIVE
Protein, ur: NEGATIVE mg/dL
Specific Gravity, Urine: 1.013 (ref 1.005–1.030)
pH: 5 (ref 5.0–8.0)

## 2016-08-21 MED ORDER — IBUPROFEN 800 MG PO TABS: 800.0000 mg | ORAL_TABLET | Freq: Three times a day (TID) | ORAL | 2 refills | Status: DC | PRN

## 2016-08-21 MED ORDER — OMEPRAZOLE 20 MG PO CPDR: 20.0000 mg | DELAYED_RELEASE_CAPSULE | Freq: Every day | ORAL | 5 refills | Status: DC

## 2016-08-21 NOTE — Progress Notes (Signed)
Zometa infusion will be held today per T. Kefalas, PA-C.  Will return in one month for repeat lab work and possible Zometa infusion.  Instructed to take Tums in addition to his daily calcium supplement to help bring up his serum calcium level. Pt verbalizes understanding of plan.  Discharged ambulatory.

## 2016-09-04 ENCOUNTER — Other Ambulatory Visit (HOSPITAL_COMMUNITY): Payer: Medicare Other

## 2016-09-04 ENCOUNTER — Ambulatory Visit (HOSPITAL_COMMUNITY): Payer: Medicare Other

## 2016-09-09 ENCOUNTER — Encounter: Payer: Self-pay | Admitting: Family Medicine

## 2016-09-09 ENCOUNTER — Ambulatory Visit (INDEPENDENT_AMBULATORY_CARE_PROVIDER_SITE_OTHER): Payer: Medicare Other | Admitting: Family Medicine

## 2016-09-09 VITALS — BP 132/80 | HR 90 | Temp 97.8°F | Resp 14 | Ht 67.0 in | Wt 251.0 lb

## 2016-09-09 DIAGNOSIS — M5410 Radiculopathy, site unspecified: Secondary | ICD-10-CM

## 2016-09-09 DIAGNOSIS — I8390 Asymptomatic varicose veins of unspecified lower extremity: Secondary | ICD-10-CM | POA: Diagnosis not present

## 2016-09-09 DIAGNOSIS — C9000 Multiple myeloma not having achieved remission: Secondary | ICD-10-CM

## 2016-09-09 MED ORDER — OXYCODONE-ACETAMINOPHEN 10-325 MG PO TABS: 1.0000 | ORAL_TABLET | ORAL | 0 refills | Status: DC | PRN

## 2016-09-09 NOTE — Assessment & Plan Note (Signed)
He is now multiple myeloma has not been able to receive treatment due to some electrolyte problems. With his increased back pain I'm concerned that there may be a bony lesion that he has known degenerative disc disease. Him and obtain plain x-rays of the spine and hip to rule this out. He did have a PET scan but that was back in February he has not been on treatment since then. Based on his history and his current medications to status anxious to refill his pain medication at this time. I would not give him any prednisone or put him on any anti-inflammatories at this time. With regards to the vein given reassurance he may of had a superficial clot that is now gone down there is no sign of any phlebitis. Advised him to call us so that we can reevaluate if he does get some swelling or pain in the varicose vein.

## 2016-09-09 NOTE — Patient Instructions (Signed)
Get the xrays  Take the pain medication F/U pending results

## 2016-09-09 NOTE — Progress Notes (Signed)
Subjective:    Patient ID: Dean [name]., male    DOB: [birth year], 81 y.o.   MRN: [id number]  Patient presents for L Sided Pain (x1 weeks- pain in L Lumbar spine radiating down through hip and knee) and Knot to Inner L Calf (x6 weeks- knot like area to inner calf to L leg- states that area is not painful, but looked like it may be blood clot- states that there is no pain or tenderness)   He is currently being followed for Multiple Myeloma , his last pet scan showed hypermetabolic lesions in his right sided ribs.   He has history of compression fractures  . He states calcium has been low so he has not been able to get Infusion Now up 1500 of calcium and 1000IU Vitamin D He has an appointment next week with oncology.  He was given Percocet last in December 2017 for his chronic back pain he does have history of compression fractures. However over the past week or so he has had increased pain in his back and into his left hip that radiates down the leg. He has known peripheral neuropathy is already on gabapentin. He denies any change in bowel or bladder. He has run out of his pain medication.  He also had a swelling on his left lower leg he is pointing to obtain he states he was initially hard but that has now gone down he is not having any pain or itching just wanted [place] to look at it.          Review Of Systems:  GEN- denies fatigue, fever, weight loss,weakness, recent illness HEENT- denies eye drainage, change in vision, nasal discharge, CVS- denies chest pain, palpitations RESP- denies SOB, cough, wheeze ABD- denies N/V, change in stools, abd pain GU- denies dysuria, hematuria, dribbling, incontinence MSK- + joint pain, muscle aches, injury Neuro- denies headache, dizziness, syncope, seizure activity       Objective:    BP 132/80   Pulse 90   Temp 97.8 F (36.6 C) (Oral)   Resp 14   Ht 5' 7"  (1.702 m)   Wt 251 lb (113.9 kg)   SpO2 100%   BMI 39.31 kg/m  GEN- NAD, alert  and oriented x3 HEENT- PERRL, EOMI, non injected sclera, pink conjunctiva, MMM, oropharynx clear CVS- RRR, no murmur RESP-CTAB ABD-NABS,soft,NT,ND MSK- TTP lumbar spine, decread ROM spine HIPS, pain with IR left hip, Equvical SLR left side, fair ROM bilat knees, non antalgic gait  EXT- No edema,varicose veins, no superficial clots  Pulses- Radial, DP- 2+        Assessment & Plan:      Problem List Items Addressed This Visit    Multiple myeloma without remission ([place])    He is now multiple myeloma has not been able to receive treatment due to some electrolyte problems. With his increased back pain I'm concerned that there may be a bony lesion that he has known degenerative disc disease. Him and obtain plain x-rays of the spine and hip to rule this out. He did have a PET scan but that was back in February he has not been on treatment since then. Based on his history and his current medications to status anxious to refill his pain medication at this time. I would not give him any prednisone or put him on any anti-inflammatories at this time. With regards to the vein given reassurance he may of had a superficial clot that is now gone down  there is no sign of any phlebitis. Advised him to call us so that we can reevaluate if he does get some swelling or pain in the varicose vein.      Relevant Orders   DG Lumbar Spine Complete   DG HIP UNILAT W OR W/O PELVIS 2-3 VIEWS LEFT    Other Visit Diagnoses    Varicose vein of leg    -  Primary   Back pain with left-sided radiculopathy       Relevant Medications   oxyCODONE-acetaminophen (PERCOCET) 10-325 MG tablet   Other Relevant Orders   DG Lumbar Spine Complete   DG HIP UNILAT W OR W/O PELVIS 2-3 VIEWS LEFT      Note: This dictation was prepared with Dragon dictation along with smaller phrase technology. Any transcriptional errors that result from this process are unintentional.

## 2016-09-10 ENCOUNTER — Ambulatory Visit (HOSPITAL_COMMUNITY)
Admission: RE | Admit: 2016-09-10 | Discharge: 2016-09-10 | Disposition: A | Discharge status: Home / Self Care | Payer: Medicare Other | Source: Ambulatory Visit | Attending: Family Medicine | Admitting: Family Medicine

## 2016-09-10 DIAGNOSIS — M47816 Spondylosis without myelopathy or radiculopathy, lumbar region: Secondary | ICD-10-CM | POA: Insufficient documentation

## 2016-09-10 DIAGNOSIS — M5410 Radiculopathy, site unspecified: Secondary | ICD-10-CM

## 2016-09-10 DIAGNOSIS — M545 Low back pain: Secondary | ICD-10-CM | POA: Diagnosis not present

## 2016-09-10 DIAGNOSIS — M25552 Pain in left hip: Secondary | ICD-10-CM | POA: Insufficient documentation

## 2016-09-10 DIAGNOSIS — C9000 Multiple myeloma not having achieved remission: Secondary | ICD-10-CM

## 2016-09-17 ENCOUNTER — Other Ambulatory Visit (HOSPITAL_COMMUNITY): Payer: Self-pay | Admitting: Oncology

## 2016-09-18 ENCOUNTER — Encounter (HOSPITAL_BASED_OUTPATIENT_CLINIC_OR_DEPARTMENT_OTHER): Payer: Medicare Other

## 2016-09-18 ENCOUNTER — Encounter (HOSPITAL_COMMUNITY): Payer: Self-pay

## 2016-09-18 ENCOUNTER — Encounter (HOSPITAL_COMMUNITY): Payer: Medicare Other | Attending: Oncology

## 2016-09-18 ENCOUNTER — Ambulatory Visit (HOSPITAL_COMMUNITY): Payer: Medicare Other

## 2016-09-18 VITALS — BP 118/60 | HR 59 | Temp 98.0°F | Resp 16 | Wt 249.2 lb

## 2016-09-18 DIAGNOSIS — C9000 Multiple myeloma not having achieved remission: Secondary | ICD-10-CM

## 2016-09-18 LAB — COMPREHENSIVE METABOLIC PANEL
ALT: 14 U/L — ABNORMAL LOW (ref 17–63)
ALT: 14 U/L — ABNORMAL LOW (ref 17–63)
AST: 17 U/L (ref 15–41)
AST: 18 U/L (ref 15–41)
Albumin: 3.6 g/dL (ref 3.5–5.0)
Albumin: 3.6 g/dL (ref 3.5–5.0)
Alkaline Phosphatase: 20 U/L — ABNORMAL LOW (ref 38–126)
Alkaline Phosphatase: 20 U/L — ABNORMAL LOW (ref 38–126)
Anion gap: 9 (ref 5–15)
Anion gap: 10 (ref 5–15)
BUN: 21 mg/dL — ABNORMAL HIGH (ref 6–20)
BUN: 21 mg/dL — ABNORMAL HIGH (ref 6–20)
CO2: 25 mmol/L (ref 22–32)
CO2: 24 mmol/L (ref 22–32)
Calcium: 8.7 mg/dL — ABNORMAL LOW (ref 8.9–10.3)
Calcium: 8.7 mg/dL — ABNORMAL LOW (ref 8.9–10.3)
Chloride: 105 mmol/L (ref 101–111)
Chloride: 105 mmol/L (ref 101–111)
Creatinine, Ser: 0.71 mg/dL (ref 0.61–1.24)
Creatinine, Ser: 0.73 mg/dL (ref 0.61–1.24)
GFR calc Af Amer: >60 mL/min (ref >60)
GFR calc Af Amer: >60 mL/min (ref >60)
GFR calc non Af Amer: >60 mL/min (ref >60)
GFR calc non Af Amer: >60 mL/min (ref >60)
Glucose, Bld: 103 mg/dL — ABNORMAL HIGH (ref 65–99)
Glucose, Bld: 101 mg/dL — ABNORMAL HIGH (ref 65–99)
Potassium: 3.8 mmol/L (ref 3.5–5.1)
Potassium: 3.8 mmol/L (ref 3.5–5.1)
Sodium: 139 mmol/L (ref 135–145)
Sodium: 139 mmol/L (ref 135–145)
Total Bilirubin: 0.8 mg/dL (ref 0.3–1.2)
Total Bilirubin: 0.5 mg/dL (ref 0.3–1.2)
Total Protein: 6.2 g/dL — ABNORMAL LOW (ref 6.5–8.1)
Total Protein: 6.2 g/dL — ABNORMAL LOW (ref 6.5–8.1)

## 2016-09-18 LAB — CBC WITH DIFFERENTIAL/PLATELET
Basophils Absolute: 0 10*3/uL (ref 0.0–0.1)
Basophils Relative: 0 %
Eosinophils Absolute: 0 10*3/uL (ref 0.0–0.7)
Eosinophils Relative: 1 %
HCT: 43.4 % (ref 39.0–52.0)
Hemoglobin: 14.6 g/dL (ref 13.0–17.0)
Lymphocytes Relative: 21 %
Lymphs Abs: 1.4 10*3/uL (ref 0.7–4.0)
MCH: 29.7 pg (ref 26.0–34.0)
MCHC: 33.6 g/dL (ref 30.0–36.0)
MCV: 88.2 fL (ref 78.0–100.0)
Monocytes Absolute: 0.7 10*3/uL (ref 0.1–1.0)
Monocytes Relative: 11 %
Neutro Abs: 4.6 10*3/uL (ref 1.7–7.7)
Neutrophils Relative %: 67 %
Platelets: 120 10*3/uL — ABNORMAL LOW (ref 150–400)
RBC: 4.92 MIL/uL (ref 4.22–5.81)
RDW: 13.5 % (ref 11.5–15.5)
WBC: 6.8 10*3/uL (ref 4.0–10.5)

## 2016-09-18 MED ORDER — SODIUM CHLORIDE 0.9% FLUSH
10.0000 mL | INTRAVENOUS | Status: DC | PRN
  Administered 2016-09-18: 10 mL via INTRAVENOUS
  Filled 2016-09-18: qty 10

## 2016-09-18 NOTE — Patient Instructions (Addendum)
[place] at [facility] Discharge Instructions  RECOMMENDATIONS MADE BY THE CONSULTANT AND ANY TEST RESULTS WILL BE SENT TO YOUR REFERRING PHYSICIAN.  Your Zometa infusion was held today. Follow up as scheduled.  Thank you for choosing [place] at [facility] to provide your oncology and hematology care.  To afford each patient quality time with our provider, please arrive at least 15 minutes before your scheduled appointment time.    If you have a lab appointment with the [facility] please come in thru the  Main Entrance and check in at the main information desk  You need to re-schedule your appointment should you arrive 10 or more minutes late.  We strive to give you quality time with our providers, and arriving late affects you and other patients whose appointments are after yours.  Also, if you no show three or more times for appointments you may be dismissed from the clinic at the providers discretion.     Again, thank you for choosing [facility].  Our hope is that these requests will decrease the amount of time that you wait before being seen by our physicians.       _____________________________________________________________  Should you have questions after your visit to [facility], please contact our office at [phone] between the hours of 8:30 a.m. and 4:30 p.m.  Voicemails left after 4:30 p.m. will not be returned until the following business day.  For prescription refill requests, have your pharmacy contact our office.       Resources For Cancer Patients and their Caregivers ? American Cancer Society: Can assist with transportation, wigs, general needs, runs Look Good Feel Better.        ([phone] ? Cancer Care: Provides financial assistance, online support groups, medication/co-pay assistance.  1-800-813-HOPE (801) 452-2639) ? [employer] Assists [place] cancer  patients and their families through emotional , educational and financial support.  (757)092-4832 ? [place] DSS Where to apply for food stamps, Medicaid and utility assistance. (786)648-8306 ? RCATS: Transportation to medical appointments. [phone] ? Social Security Administration: May apply for disability if have a Stage IV cancer. ([phone] 351-9377 (970)485-5546 ? [employer], Disability and Transit Services: Assists with nutrition, care and transit needs. [place] Support Programs: @10RELATIVEDAYS @ > Cancer Support Group  2nd Tuesday of the month 1pm-2pm, Journey Room  > Creative Journey  3rd Tuesday of the month 1130am-1pm, Journey Room  > Look Good Feel Better  1st Wednesday of the month 10am-12 noon, Journey Room (Call [place] to register [phone])

## 2016-09-18 NOTE — Progress Notes (Signed)
Lab work reviewed with Mike Craze, NP. We will hold treatment for today. Patient will return at his next scheduled appointment.  Patient discharged ambulatory and in stable condition from clinic to self.

## 2016-09-19 ENCOUNTER — Telehealth: Payer: Self-pay | Admitting: Family Medicine

## 2016-09-19 LAB — PROTEIN ELECTROPHORESIS, SERUM
A/G Ratio: 1.4 (ref 0.7–1.7)
Albumin ELP: 3.6 g/dL (ref 2.9–4.4)
Alpha-1-Globulin: 0.2 g/dL (ref 0.0–0.4)
Alpha-2-Globulin: 0.7 g/dL (ref 0.4–1.0)
Beta Globulin: 0.9 g/dL (ref 0.7–1.3)
Gamma Globulin: 0.6 g/dL (ref 0.4–1.8)
Globulin, Total: 2.5 g/dL (ref 2.2–3.9)
M-Spike, %: 0.3 g/dL — ABNORMAL HIGH
Total Protein ELP: 6.1 g/dL (ref 6.0–8.5)

## 2016-09-19 LAB — KAPPA/LAMBDA LIGHT CHAINS
Kappa free light chain: 29.6 mg/L — ABNORMAL HIGH (ref 3.3–19.4)
Kappa, lambda light chain ratio: 1.85 — ABNORMAL HIGH (ref 0.26–1.65)
Lambda free light chains: 16 mg/L (ref 5.7–26.3)

## 2016-09-19 LAB — IMMUNOFIXATION ELECTROPHORESIS
IgA: 138 mg/dL (ref 61–437)
IgG (Immunoglobin G), Serum: 617 mg/dL — ABNORMAL LOW (ref 700–1600)
IgM (Immunoglobulin M), Srm: 35 mg/dL (ref 15–143)
Total Protein ELP: 6 g/dL (ref 6.0–8.5)

## 2016-09-19 LAB — VITAMIN D 25 HYDROXY (VIT D DEFICIENCY, FRACTURES): Vit D, 25-Hydroxy: 44.1 ng/mL (ref 30.0–100.0)

## 2016-09-19 LAB — IGG, IGA, IGM
IgA: 138 mg/dL (ref 61–437)
IgG (Immunoglobin G), Serum: 600 mg/dL — ABNORMAL LOW (ref 700–1600)
IgM (Immunoglobulin M), Srm: 33 mg/dL (ref 15–143)

## 2016-09-19 NOTE — Telephone Encounter (Signed)
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 Said the nurse at cancer center told him to call us that we needed to call him in an antibx eye drop. Informed pt that he needed to be seen however he should start with his eye doctor. He does go to Dr. Gordan Payment and will call him to see if he can be seen today. Instructed him to call me back if needed help with an appointment.  (he did not go to ED for this)

## 2016-09-19 NOTE — Telephone Encounter (Signed)
New Message  Welding flash in his eye went to ED and pt is needing antibiotic drops.  Pt voiced he probably scratched his eye.  Please f/u with pt

## 2016-09-23 DIAGNOSIS — H10501 Unspecified blepharoconjunctivitis, right eye: Secondary | ICD-10-CM | POA: Diagnosis not present

## 2016-09-29 ENCOUNTER — Encounter: Payer: Self-pay | Admitting: Family Medicine

## 2016-09-29 NOTE — Telephone Encounter (Signed)
This encounter was created in error - please disregard.

## 2016-10-01 ENCOUNTER — Ambulatory Visit (INDEPENDENT_AMBULATORY_CARE_PROVIDER_SITE_OTHER): Payer: Medicare Other | Admitting: Family Medicine

## 2016-10-01 ENCOUNTER — Encounter: Payer: Self-pay | Admitting: Family Medicine

## 2016-10-01 DIAGNOSIS — C9000 Multiple myeloma not having achieved remission: Secondary | ICD-10-CM

## 2016-10-01 DIAGNOSIS — M170 Bilateral primary osteoarthritis of knee: Secondary | ICD-10-CM

## 2016-10-01 DIAGNOSIS — M5136 Other intervertebral disc degeneration, lumbar region: Secondary | ICD-10-CM | POA: Diagnosis not present

## 2016-10-01 DIAGNOSIS — G4700 Insomnia, unspecified: Secondary | ICD-10-CM

## 2016-10-01 DIAGNOSIS — M549 Dorsalgia, unspecified: Secondary | ICD-10-CM | POA: Insufficient documentation

## 2016-10-01 DIAGNOSIS — M179 Osteoarthritis of knee, unspecified: Secondary | ICD-10-CM | POA: Insufficient documentation

## 2016-10-01 DIAGNOSIS — M1710 Unilateral primary osteoarthritis, unspecified knee: Secondary | ICD-10-CM | POA: Insufficient documentation

## 2016-10-01 DIAGNOSIS — G8929 Other chronic pain: Secondary | ICD-10-CM | POA: Insufficient documentation

## 2016-10-01 MED ORDER — DICLOFENAC SODIUM 1 % TD GEL: TRANSDERMAL | 3 refills | Status: DC

## 2016-10-01 MED ORDER — TRAMADOL HCL 50 MG PO TABS: 50.0000 mg | ORAL_TABLET | Freq: Three times a day (TID) | ORAL | 0 refills | Status: DC | PRN

## 2016-10-01 MED ORDER — ALPRAZOLAM 1 MG PO TABS: 2.0000 mg | ORAL_TABLET | Freq: Every evening | ORAL | 2 refills | Status: DC | PRN

## 2016-10-01 NOTE — Assessment & Plan Note (Signed)
Trial of ultram TID and voltaren gel for arthritis D/C oxycodone  He also requested his xanax be refilled, has been filled from his oncologist in the past.  I did go ahead and fill this, want to avoid benzo withdrawel takes for sleep

## 2016-10-01 NOTE — Patient Instructions (Signed)
F/U as needed

## 2016-10-01 NOTE — Progress Notes (Signed)
   Subjective:    Patient ID: Dean Danker., male    DOB: [birth year], 81 y.o.   MRN: [id number]  Patient presents for Medication Management (has fear of addiction to pain medications- has tried Tramadol and noted it to be effective)   Orthopedics- Dr. Stanford Breed, told nothing else can be done for his pain/ neuropathy/arthritis, takes gabapentin   Chronic back pain- given oxycodone at last visit, it does not help as much, only last 3 hours, also caused constpation, he stopped taking it. Took some ultram last week and this helped   Review Of Systems:  GEN- denies fatigue, fever, weight loss,weakness, recent illness HEENT- denies eye drainage, change in vision, nasal discharge, CVS- denies chest pain, palpitations RESP- denies SOB, cough, wheeze ABD- denies N/V, change in stools, abd pain GU- denies dysuria, hematuria, dribbling, incontinence MSK- + joint pain, muscle aches, injury Neuro- denies headache, dizziness, syncope, seizure activity       Objective:    BP 134/82   Pulse 62   Temp 97.6 F (36.4 C) (Oral)   Resp 14   Ht '5\' 7"'$  (1.702 m)   Wt 249 lb (112.9 kg)   SpO2 96%   BMI 39.00 kg/m  GEN- NAD, alert and oriented x3 MSK- walking with cane, decreasd ROM Spine, hips/knees, no knee effusion Ext - no edema         Assessment & Plan:      Problem List Items Addressed This Visit      Unprioritized   Insomnia   Relevant Medications   ALPRAZolam (XANAX) 1 MG tablet   OA (osteoarthritis) of knee   Relevant Medications   traMADol (ULTRAM) 50 MG tablet   DDD (degenerative disc disease), lumbar    Trial of ultram TID and voltaren gel for arthritis D/C oxycodone  He also requested his xanax be refilled, has been filled from his oncologist in the past.  I did go ahead and fill this, want to avoid benzo withdrawel takes for sleep       Relevant Medications   traMADol (ULTRAM) 50 MG tablet    Other Visit Diagnoses    Multiple myeloma, remission status  unspecified (H[facility])       Relevant Medications   ALPRAZolam (XANAX) 1 MG tablet      Note: This dictation was prepared with Dragon dictation along with smaller phrase technology. Any transcriptional errors that result from this process are unintentional.

## 2016-10-13 ENCOUNTER — Other Ambulatory Visit: Payer: Self-pay | Admitting: Family Medicine

## 2016-10-13 ENCOUNTER — Encounter: Payer: Self-pay | Admitting: Family Medicine

## 2016-10-13 ENCOUNTER — Ambulatory Visit (INDEPENDENT_AMBULATORY_CARE_PROVIDER_SITE_OTHER): Payer: Medicare Other | Admitting: Family Medicine

## 2016-10-13 VITALS — BP 110/62 | HR 71 | Temp 98.0°F | Resp 12 | Ht 67.0 in | Wt 246.6 lb

## 2016-10-13 DIAGNOSIS — M5136 Other intervertebral disc degeneration, lumbar region: Secondary | ICD-10-CM | POA: Diagnosis not present

## 2016-10-13 DIAGNOSIS — M48061 Spinal stenosis, lumbar region without neurogenic claudication: Secondary | ICD-10-CM | POA: Diagnosis not present

## 2016-10-13 DIAGNOSIS — G8929 Other chronic pain: Secondary | ICD-10-CM

## 2016-10-13 DIAGNOSIS — M51369 Other intervertebral disc degeneration, lumbar region without mention of lumbar back pain or lower extremity pain: Secondary | ICD-10-CM

## 2016-10-13 DIAGNOSIS — M549 Dorsalgia, unspecified: Secondary | ICD-10-CM

## 2016-10-13 MED ORDER — OXYCODONE-ACETAMINOPHEN 10-325 MG PO TABS: 1.0000 | ORAL_TABLET | Freq: Two times a day (BID) | ORAL | 0 refills | Status: DC | PRN

## 2016-10-13 NOTE — Assessment & Plan Note (Signed)
Progressive acute on chronic back pain symptoms. We'll continue with oxycodone the cyst does ease some of his pain. He has history of degenerative disc as well as spinal stenosis compression fractures multiple myeloma left-sided radicular symptoms which we discussed a month or so ago has worsened, to proceed with an MRI of the lumbar spine. This is difficult to tease out as he also has significant neuropathy worse on the left side as well

## 2016-10-13 NOTE — Patient Instructions (Addendum)
MRI Lumbar spine to be done  Take the percocet  F/U as needed

## 2016-10-13 NOTE — Progress Notes (Signed)
   Subjective:    Patient ID: Dean Danker., male    DOB: February 11, 1935, 81 y.o.   MRN: [id number]  Patient presents for left leg pain (severe last month )   Has DDD lumbar spine, history of compressoin fractures, has known neuropathy as well.He has been seen by orthopedics they told him nothing more can be done for his arthritis or his severe neuropathy. But his back pain is the worsening of the past few months. I've seen him twice we did x-rays as he does have multiple myeloma history of skull lesion but there was no metastatic lesions on the x-ray. He was given Percocet initially which helped but he was concerned about the addiction potential and constipation therefore he was given tramadol this only worked for a few days now he has been back on his Percocet which he is running out of. This pain is going down mostly into the left leg now but this is also side he has severe neuropathy in.   Review Of Systems:  GEN- denies fatigue, fever, weight loss,weakness, recent illness HEENT- denies eye drainage, change in vision, nasal discharge, CVS- denies chest pain, palpitations RESP- denies SOB, cough, wheeze ABD- denies N/V, change in stools, abd pain GU- denies dysuria, hematuria, dribbling, incontinence MSK- +joint pain, muscle aches, injury Neuro- denies headache, dizziness, syncope, seizure activity       Objective:    BP 110/62   Pulse 71   Temp 98 F (36.7 C) (Oral)   Resp 12   Ht '5\' 7"'$  (1.702 m)   Wt 246 lb 9.6 oz (111.9 kg)   SpO2 98%   BMI 38.62 kg/m  GEN- NAD, alert and oriented x3 CVS- RRR, no murmur RESP-CTAB MSK- TTP lumbar spine, decread ROM spine HIPS, +SLR left side, fair ROM bilat knees, non antalgic gait  EXT- No edema,varicose veins Pulses- Radial2+        Assessment & Plan:      Problem List Items Addressed This Visit      Unprioritized   DDD (degenerative disc disease), lumbar   Relevant Medications   oxyCODONE-acetaminophen (PERCOCET) 10-325 MG  tablet   Other Relevant Orders   MR Lumbar Spine Wo Contrast   Chronic back pain - Primary   Relevant Medications   oxyCODONE-acetaminophen (PERCOCET) 10-325 MG tablet   Other Relevant Orders   MR Lumbar Spine Wo Contrast   Spinal stenosis, lumbar    Progressive acute on chronic back pain symptoms. We'll continue with oxycodone the cyst does ease some of his pain. He has history of degenerative disc as well as spinal stenosis compression fractures multiple myeloma left-sided radicular symptoms which we discussed a month or so ago has worsened, to proceed with an MRI of the lumbar spine. This is difficult to tease out as he also has significant neuropathy worse on the left side as well      Relevant Orders   MR Lumbar Spine Wo Contrast      Note: This dictation was prepared with Dragon dictation along with smaller phrase technology. Any transcriptional errors that result from this process are unintentional.

## 2016-10-13 NOTE — Telephone Encounter (Signed)
Last OV 10/13/2016 Last refill

## 2016-10-15 ENCOUNTER — Other Ambulatory Visit (HOSPITAL_COMMUNITY): Payer: Medicare Other

## 2016-10-15 ENCOUNTER — Other Ambulatory Visit (HOSPITAL_COMMUNITY): Payer: Self-pay | Admitting: Oncology

## 2016-10-15 ENCOUNTER — Ambulatory Visit (HOSPITAL_COMMUNITY): Payer: Medicare Other

## 2016-10-16 ENCOUNTER — Encounter (HOSPITAL_COMMUNITY): Payer: Self-pay

## 2016-10-16 ENCOUNTER — Encounter (HOSPITAL_BASED_OUTPATIENT_CLINIC_OR_DEPARTMENT_OTHER): Payer: Medicare Other

## 2016-10-16 ENCOUNTER — Encounter (HOSPITAL_COMMUNITY): Payer: Medicare Other | Attending: Oncology

## 2016-10-16 VITALS — BP 113/59 | HR 57 | Resp 16 | Wt 242.2 lb

## 2016-10-16 DIAGNOSIS — C9000 Multiple myeloma not having achieved remission: Secondary | ICD-10-CM | POA: Insufficient documentation

## 2016-10-16 LAB — COMPREHENSIVE METABOLIC PANEL
ALT: 15 U/L — ABNORMAL LOW (ref 17–63)
AST: 18 U/L (ref 15–41)
Albumin: 3.7 g/dL (ref 3.5–5.0)
Alkaline Phosphatase: 22 U/L — ABNORMAL LOW (ref 38–126)
Anion gap: 9 (ref 5–15)
BUN: 20 mg/dL (ref 6–20)
CO2: 24 mmol/L (ref 22–32)
Calcium: 8.8 mg/dL — ABNORMAL LOW (ref 8.9–10.3)
Chloride: 106 mmol/L (ref 101–111)
Creatinine, Ser: 0.82 mg/dL (ref 0.61–1.24)
GFR calc Af Amer: >60 mL/min (ref >60)
GFR calc non Af Amer: >60 mL/min (ref >60)
Glucose, Bld: 103 mg/dL — ABNORMAL HIGH (ref 65–99)
Potassium: 4 mmol/L (ref 3.5–5.1)
Sodium: 139 mmol/L (ref 135–145)
Total Bilirubin: 0.8 mg/dL (ref 0.3–1.2)
Total Protein: 6.6 g/dL (ref 6.5–8.1)

## 2016-10-16 MED ORDER — ZOLEDRONIC ACID 4 MG/100ML IV SOLN
4.0000 mg | Freq: Once | INTRAVENOUS | Status: AC
Start: 2016-10-16 — End: 2016-10-16
  Administered 2016-10-16: 4 mg via INTRAVENOUS
  Filled 2016-10-16: qty 100

## 2016-10-16 MED ORDER — SODIUM CHLORIDE 0.9 % IV SOLN
Freq: Once | INTRAVENOUS | Status: AC
  Administered 2016-10-16: 11:00:00 via INTRAVENOUS

## 2016-10-16 NOTE — Progress Notes (Signed)
Labs reviewed with Dr.Zhou, calcium 8.9 today.continue with treatment today and then we will do zometa every 2 months.  Patient states he is taking his calcium and no dental work being done at this time.  Treatment given per orders. Patient tolerated it well without problems. Vitals stable and discharged home from clinic ambulatory. Follow up as scheduled.

## 2016-10-16 NOTE — Patient Instructions (Signed)
[place] at [facility] Discharge Instructions  RECOMMENDATIONS MADE BY THE CONSULTANT AND ANY TEST RESULTS WILL BE SENT TO YOUR REFERRING PHYSICIAN.  zometa given today.] Will start getting this every 2 months now. Follow up as scheduled.  Thank you for choosing [place] at [facility] to provide your oncology and hematology care.  To afford each patient quality time with our provider, please arrive at least 15 minutes before your scheduled appointment time.    If you have a lab appointment with the [facility] please come in thru the  Main Entrance and check in at the main information desk  You need to re-schedule your appointment should you arrive 10 or more minutes late.  We strive to give you quality time with our providers, and arriving late affects you and other patients whose appointments are after yours.  Also, if you no show three or more times for appointments you may be dismissed from the clinic at the providers discretion.     Again, thank you for choosing [facility].  Our hope is that these requests will decrease the amount of time that you wait before being seen by our physicians.       _____________________________________________________________  Should you have questions after your visit to [facility], please contact our office at [phone] between the hours of 8:30 a.m. and 4:30 p.m.  Voicemails left after 4:30 p.m. will not be returned until the following business day.  For prescription refill requests, have your pharmacy contact our office.       Resources For Cancer Patients and their Caregivers ? American Cancer Society: Can assist with transportation, wigs, general needs, runs Look Good Feel Better.        6071768134 ? Cancer Care: Provides financial assistance, online support groups, medication/co-pay assistance.  1-800-813-HOPE (360)679-4314) ? [place] Assists [place] cancer patients and their families through emotional , educational and financial support.  754-470-1393 ? [place] DSS Where to apply for food stamps, Medicaid and utility assistance. 682-485-0254 ? RCATS: Transportation to medical appointments. (608)401-2753 ? Social Security Administration: May apply for disability if have a Stage IV cancer. [phone] [phone] 9058150878 ? [employer], Disability and Transit Services: Assists with nutrition, care and transit needs. [place] Support Programs: @10RELATIVEDAYS @ > Cancer Support Group  2nd Tuesday of the month 1pm-2pm, Journey Room  > Creative Journey  3rd Tuesday of the month 1130am-1pm, Journey Room  > Look Good Feel Better  1st Wednesday of the month 10am-12 noon, Journey Room (Call [place] to register [phone])

## 2016-10-24 ENCOUNTER — Ambulatory Visit
Admission: RE | Admit: 2016-10-24 | Discharge: 2016-10-24 | Disposition: A | Discharge status: Home / Self Care | Payer: Medicare Other | Source: Ambulatory Visit | Attending: Family Medicine | Admitting: Family Medicine

## 2016-10-24 DIAGNOSIS — M5136 Other intervertebral disc degeneration, lumbar region: Secondary | ICD-10-CM

## 2016-10-24 DIAGNOSIS — M48061 Spinal stenosis, lumbar region without neurogenic claudication: Secondary | ICD-10-CM

## 2016-10-24 DIAGNOSIS — G8929 Other chronic pain: Secondary | ICD-10-CM

## 2016-10-24 DIAGNOSIS — M549 Dorsalgia, unspecified: Principal | ICD-10-CM

## 2016-10-29 ENCOUNTER — Ambulatory Visit (HOSPITAL_COMMUNITY): Payer: Medicare Other

## 2016-11-06 ENCOUNTER — Encounter (HOSPITAL_COMMUNITY): Payer: Medicare Other

## 2016-11-06 ENCOUNTER — Encounter (HOSPITAL_COMMUNITY): Payer: Self-pay

## 2016-11-06 ENCOUNTER — Ambulatory Visit (HOSPITAL_COMMUNITY)
Admission: RE | Admit: 2016-11-06 | Discharge: 2016-11-06 | Disposition: A | Discharge status: Home / Self Care | Payer: Medicare Other | Source: Ambulatory Visit | Attending: Oncology | Admitting: Oncology

## 2016-11-06 ENCOUNTER — Encounter (HOSPITAL_COMMUNITY): Payer: Medicare Other | Attending: Oncology | Admitting: Oncology

## 2016-11-06 VITALS — BP 114/99 | HR 104 | Resp 18 | Ht 68.0 in | Wt 241.0 lb

## 2016-11-06 DIAGNOSIS — Z8781 Personal history of (healed) traumatic fracture: Secondary | ICD-10-CM | POA: Insufficient documentation

## 2016-11-06 DIAGNOSIS — C9000 Multiple myeloma not having achieved remission: Secondary | ICD-10-CM | POA: Diagnosis not present

## 2016-11-06 DIAGNOSIS — R300 Dysuria: Secondary | ICD-10-CM | POA: Diagnosis not present

## 2016-11-06 DIAGNOSIS — I700 Atherosclerosis of aorta: Secondary | ICD-10-CM | POA: Diagnosis not present

## 2016-11-06 DIAGNOSIS — G629 Polyneuropathy, unspecified: Secondary | ICD-10-CM

## 2016-11-06 DIAGNOSIS — G4700 Insomnia, unspecified: Secondary | ICD-10-CM

## 2016-11-06 LAB — CBC WITH DIFFERENTIAL/PLATELET
Basophils Absolute: 0 10*3/uL (ref 0.0–0.1)
Basophils Relative: 0 %
Eosinophils Absolute: 0 10*3/uL (ref 0.0–0.7)
Eosinophils Relative: 0 %
HCT: 44.8 % (ref 39.0–52.0)
Hemoglobin: 14.9 g/dL (ref 13.0–17.0)
Lymphocytes Relative: 24 %
Lymphs Abs: 2 10*3/uL (ref 0.7–4.0)
MCH: 29.6 pg (ref 26.0–34.0)
MCHC: 33.3 g/dL (ref 30.0–36.0)
MCV: 89.1 fL (ref 78.0–100.0)
Monocytes Absolute: 0.7 10*3/uL (ref 0.1–1.0)
Monocytes Relative: 9 %
Neutro Abs: 5.4 10*3/uL (ref 1.7–7.7)
Neutrophils Relative %: 67 %
Platelets: 161 10*3/uL (ref 150–400)
RBC: 5.03 MIL/uL (ref 4.22–5.81)
RDW: 13.6 % (ref 11.5–15.5)
WBC: 8.1 10*3/uL (ref 4.0–10.5)

## 2016-11-06 LAB — COMPREHENSIVE METABOLIC PANEL
ALT: 12 U/L — ABNORMAL LOW (ref 17–63)
AST: 16 U/L (ref 15–41)
Albumin: 3.8 g/dL (ref 3.5–5.0)
Alkaline Phosphatase: 24 U/L — ABNORMAL LOW (ref 38–126)
Anion gap: 9 (ref 5–15)
BUN: 22 mg/dL — ABNORMAL HIGH (ref 6–20)
CO2: 24 mmol/L (ref 22–32)
Calcium: 9.5 mg/dL (ref 8.9–10.3)
Chloride: 104 mmol/L (ref 101–111)
Creatinine, Ser: 0.9 mg/dL (ref 0.61–1.24)
GFR calc Af Amer: >60 mL/min (ref >60)
GFR calc non Af Amer: >60 mL/min (ref >60)
Glucose, Bld: 100 mg/dL — ABNORMAL HIGH (ref 65–99)
Potassium: 4 mmol/L (ref 3.5–5.1)
Sodium: 137 mmol/L (ref 135–145)
Total Bilirubin: 0.6 mg/dL (ref 0.3–1.2)
Total Protein: 6.6 g/dL (ref 6.5–8.1)

## 2016-11-06 NOTE — Progress Notes (Signed)
[place], [place] [address]   CLINIC:  Medical Oncology/Hematology  PCP:  [name], MD [address] [place] [address] [phone]   REASON FOR VISIT:  Follow-up for multiple myeloma   CURRENT THERAPY: Zometa    BRIEF ONCOLOGIC HISTORY:    Multiple myeloma without remission ([place])   11/16/2012 Initial Diagnosis    L occipital condyle destructive lesion, not biopsied secondary to location. XRT      12/01/2012 Imaging    L3 superior endplate compression FX, no lytic lesions to suggest myeloma      02/06/2014 Imaging    soft tissue mass along the right posterior fifth rib with some rib destruction      02/16/2014 Miscellaneous    Normal CBC, Normal CMP, kappa,lamda ratio of 2.62 (abnormal), UIEP with slightly restricted band, IgG kappa, SPEP/IEP with monoclonal protein at 0.79 g/dl, normal igG, suppressed IGM      02/20/2014 Bone Marrow Biopsy    33% kappa restricted plasma cells Normal FISH, normal cytogenetics      02/21/2014 - 03/08/2014 Radiation Therapy    30Gy to R rib lesion, lesion not biopsied      03/16/2014 PET scan    Scattered hypermetabolic osseous lesions, including the calvarium, ribs, left scapula, sacrum, and left femoral shaft. An expansile left vertebral body lesion at L3 extends into the epidural space of the left lateral recess,       03/17/2014 - 04/07/2014 Chemotherapy    Velcade and Dexamethasone due to initial denial of Revlimid by insurance company.  Zometa monthly.      03/22/2014 Imaging    MR_ L-Spine- Enhancing lesions compatible with multiple myeloma at L2, L3, and S1-2.      04/07/2014 - 07/28/2014 Chemotherapy    RVD with Revlimid at 25 mg days 1-14.  Revlimid dose reduced to 25 mg every other day x 14 days with 7 day respite beginning on day 1 of cycle 2.      04/17/2014 Adverse Reaction    Revlimid-induced rash.  Treated with steroids.      07/28/2014 - 08/04/2014 Chemotherapy      Revlimid daily      08/04/2014 Adverse Reaction    Velcade-induced peripheral neuropathy      08/04/2014 Treatment Plan Change    D/C Velcade      08/11/2014 PET scan    Response to therapy. Improvement and resolution of foci of osseous hypermetabolism.      08/22/2014 - 08/28/2014 Chemotherapy    Revlimid 25 mg days 1-21 every 28 days      08/28/2014 Adverse Reaction    Revlimid-induced rash. Medication held.  Medrol dose Pak prescribed.      09/04/2014 - 01/08/2015 Chemotherapy    Revlimid 10 mg every other day, without dexamethasone      01/08/2015 - 01/15/2015 Hospital Admission    Colonic diverticular abscess with IR drain placement      03/02/2015 PET scan    Only bony uptake is low activity at site of deformity/callus at R second rib FX, high activity in sigmoid colon, advanced sigmoid divertidulosis. Abscess not resolved?      06/04/2015 Imaging    CT nonobstructive L renal calculus, diverticulosis of descending and sigmoid colon without inflammation, moderate prostatic enlargement      07/12/2015 Surgery    Diverticulitis s/p robotic sigmoid colectomy with Dr. Johney Maine      08/23/2015 PET scan  Primarily similar hypermetabolic osseous foci within the R sided ribs. new L third rib focus of hypermetab and sclerosis is favored to be related to healing FX. no soft tissue myeloma id'd. Presumed postop hypermetab and edema about the R pelvic wall       03/06/2016 PET scan    1. Reduced activity in the prior lesion such as the right second and fifth rib lesions, activity nearly completely resolved and significantly less than added mediastinal blood pool activity. No new lesions are identified. 2. Other imaging findings of potential clinical significance: Coronary, aortic arch, and branch vessel atherosclerotic vascular disease. Aortoiliac atherosclerotic vascular disease. Enlarged prostate gland. Colonic diverticula.        HISTORY OF PRESENT ILLNESS:  (From Dr.  Donald Pore last note dated 01/22/16)    INTERVAL HISTORY:  Mr. Weaver reports to cancer center for follow-up of multiple myeloma and consideration for next dose of Zometa.   His biggest complaint today is continued arthralgias and muscle cramps.  He reports that he has had decreased neck ROM, which is frustrating for him.  Reports feeling "weak" after taking his medications, particularly the gabapentin.  His chronic peripheral neuropathy is stable, but worsens when he is on his feet for a long time.  His ankles swell.  He takes tonic water for myalgias, which is very helpful. "There's no amount of money in the world I wouldn't pay for that stuff (tonic water); it's a godsend."   Reports that he is taking his calcium and vitamin D as directed.    Also reports dysuria for the past several days. Denies fever/chills or night sweats.     REVIEW OF SYSTEMS:  Review of Systems  Constitutional: Positive for fatigue. Negative for chills, diaphoresis and fever.  HENT:  Negative.  Negative for lump/mass.   Eyes: Negative.   Respiratory: Negative.  Negative for cough and shortness of breath.   Cardiovascular: Negative for leg swelling.  Gastrointestinal: Positive for constipation. Negative for abdominal pain, blood in stool, diarrhea, nausea and vomiting.  Endocrine: Negative.   Genitourinary: Negative for dysuria.   Musculoskeletal: Positive for arthralgias and back pain. Negative for myalgias and neck pain.  Skin: Negative.  Negative for rash.  Neurological: Positive for numbness (chronic neuropathy; on gabapentin).  Hematological: Negative.   Psychiatric/Behavioral: Positive for sleep disturbance (takes medication for sleep).     PAST MEDICAL/SURGICAL HISTORY:  Past Medical History:  Diagnosis Date  . BPH (benign prostatic hyperplasia)   . Colonic diverticular abscess 01/08/2015  . Diverticulitis 68/34/19   complicated by abscess and required percutaneous drainage  . GERD  (gastroesophageal reflux disease)   . H/O ETOH abuse   . History of chemotherapy last done jan 2017  . History of radiation therapy 01/05/13-02/10/13   45 gray to left occipital condyle region  . Intra-abdominal abscess ([place])   . Multiple myeloma ([place]) 2015  . Neuropathy   . Radiation 02/21/14-03/08/14   right posterior chest wall area 30 gray  . Radiation 04/19/14-05/02/14   lumbar spine 25 gray  . Skull lesion    Left occipital condyle  . Wrist fracture, left    x 2   Past Surgical History:  Procedure Laterality Date  . COLONOSCOPY N/A 04/19/2015   Procedure: COLONOSCOPY;  Surgeon: Danie Binder, MD;  Location: [facility];  Service: Endoscopy;  Laterality: N/A;  1:30 PM  . CYST EXCISION  1959   tail bone     SOCIAL HISTORY:  Social History   Social  History  . Marital status: Married    Spouse name: N/A  . Number of children: 3  . Years of education: N/A   Occupational History  .  Retired   Social History Main Topics  . Smoking status: Never Smoker  . Smokeless tobacco: Never Used  . Alcohol use No     Comment: " Not much no more"  . Drug use: No  . Sexual activity: Not on file   Other Topics Concern  . Not on file   Social History Narrative  . No narrative on file    FAMILY HISTORY:  Family History  Problem Relation Age of Onset  . Diabetes Mother   . Stroke Mother   . Hypertension Father     CURRENT MEDICATIONS:  Outpatient Encounter Prescriptions as of 11/06/2016  Medication Sig Note  . ALPRAZolam (XANAX) 1 MG tablet Take 2 tablets (2 mg total) by mouth at bedtime as needed for sleep. For anxiety and/or sleep   . aspirin EC 325 MG tablet Take 1 tablet (325 mg total) by mouth daily.   Marland Kitchen CALCIUM-VITAMIN D PO Take by mouth. 1500/1000 PO QD   . diclofenac sodium (VOLTAREN) 1 % GEL Apply to affected areas four times a day as needed   . EPINEPHrine 0.3 mg/0.3 mL IJ SOAJ injection Inject 0.3 mLs (0.3 mg total) into the muscle once.   . gabapentin  (NEURONTIN) 400 MG capsule TAKE 5 CAPSULES BY MOUTH (2000 MG TOTAL)DAILY   . ibuprofen (ADVIL,MOTRIN) 800 MG tablet Take 1 tablet (800 mg total) by mouth every 8 (eight) hours as needed for moderate pain.   Marland Kitchen omeprazole (PRILOSEC) 20 MG capsule Take 1 capsule (20 mg total) by mouth daily. (Patient taking differently: Take 20 mg by mouth daily. Prn)   . oxyCODONE-acetaminophen (PERCOCET) 10-325 MG tablet Take 1 tablet by mouth 2 (two) times daily as needed for pain.   . tamsulosin (FLOMAX) 0.4 MG CAPS capsule TAKE 1 CAPSULE BY MOUTH AT BEDTIME   . zolendronic acid (ZOMETA) 4 MG/5ML injection Inject 4 mg into the vein every 28 (twenty-eight) days.  06/20/2015: He receives doses at [facility]. His last dose 06/14/15.   Facility-Administered Encounter Medications as of 11/06/2016  Medication  . sodium chloride 0.9 % injection 10 mL    ALLERGIES:  Allergies  Allergen Reactions  . Codeine Other (See Comments)    Severe headache  . Morphine And Related Other (See Comments)    Makes him feel weird  . Revlimid [Lenalidomide] Other (See Comments)    "Causes me to become weak"     PHYSICAL EXAM:  ECOG Performance status: 1 - Symptomatic, but independent.   Vitals:   11/06/16 1544  BP: (!) 114/99  Pulse: (!) 104  Resp: 18  SpO2: 98%   Filed Weights   11/06/16 1544  Weight: 241 lb (109.3 kg)     Physical Exam  Constitutional: He is oriented to person, place, and time and well-developed, well-nourished, and in no distress.  -Wears sunglasses d/t light sensitivity  HENT:  Head: Normocephalic.  Mouth/Throat: Oropharynx is clear and moist.  Eyes: Conjunctivae are normal. No scleral icterus.  Neck: Normal range of motion. Neck supple.  Cardiovascular: Normal rate and regular rhythm.   Pulmonary/Chest: Effort normal. No respiratory distress. He has no wheezes.  Abdominal: Soft. Bowel sounds are normal. There is no tenderness. There is no rebound and no guarding.  Musculoskeletal: Normal  range of motion. He exhibits no edema.  Lymphadenopathy:  He has no cervical adenopathy.       Right: No supraclavicular adenopathy present.       Left: No supraclavicular adenopathy present.  Neurological: He is alert and oriented to person, place, and time. No cranial nerve deficit.  Skin: Skin is warm and dry. No rash noted.  Psychiatric: Mood, memory, affect and judgment normal.  Nursing note and vitals reviewed.    LABORATORY DATA:  I have reviewed the labs as listed.  CBC    Component Value Date/Time   WBC 6.8 09/18/2016 0900   RBC 4.92 09/18/2016 0900   HGB 14.6 09/18/2016 0900   HCT 43.4 09/18/2016 0900   PLT 120 (L) 09/18/2016 0900   MCV 88.2 09/18/2016 0900   MCH 29.7 09/18/2016 0900   MCHC 33.6 09/18/2016 0900   RDW 13.5 09/18/2016 0900   LYMPHSABS 1.4 09/18/2016 0900   MONOABS 0.7 09/18/2016 0900   EOSABS 0.0 09/18/2016 0900   BASOSABS 0.0 09/18/2016 0900   CMP Latest Ref Rng & Units 10/16/2016 09/18/2016 09/18/2016  Glucose 65 - 99 mg/dL 103(H) 101(H) 103(H)  BUN 6 - 20 mg/dL 20 21(H) 21(H)  Creatinine 0.61 - 1.24 mg/dL 0.82 0.73 0.71  Sodium 135 - 145 mmol/L 139 139 139  Potassium 3.5 - 5.1 mmol/L 4.0 3.8 3.8  Chloride 101 - 111 mmol/L 106 105 105  CO2 22 - 32 mmol/L 24 24 25   Calcium 8.9 - 10.3 mg/dL 8.8(L) 8.7(L) 8.7(L)  Total Protein 6.5 - 8.1 g/dL 6.6 6.2(L) 6.2(L)  Total Bilirubin 0.3 - 1.2 mg/dL 0.8 0.5 0.8  Alkaline Phos 38 - 126 U/L 22(L) 20(L) 20(L)  AST 15 - 41 U/L 18 18 17   ALT 17 - 63 U/L 15(L) 14(L) 14(L)      PENDING LABS:  Myeloma labs pending    DIAGNOSTIC IMAGING:  PET scan: 03/06/16      PATHOLOGY:  Bone marrow biopsy: 02/20/14   Cytogenetics: 02/20/14    ASSESSMENT & PLAN:   IgG kappa multiple myeloma:  -New sacral lytic lesion on recent MRI lumbar spine from 10/2016. Will get a skeletal survey today to see if he has new bone disease any where else. Last PET scan in 03/06/16 with stable findings. Will repeat PET scan  again.  -Will continue to monitor his myeloma labs. Last M-spike was stable at 0.3 in 09/2016.  He did not go get labs prior to this visit today; therefore I have ordered labs for him. -Clinically, he did not previously tolerate chemotherapy treatment well and thus we will continue to monitor. May do either single agent velcade or revlimid.  Hypocalcemia:  -Continue calcium/vitamin D 1500/1000 PO daily. -Due to his persistent hypocalcemia despite replacement, we have moved his zometa out to every 8 weeks.   -Return to cancer center 1-2 days after PET is performed to review his results from labs, skeletal survey, PET scan.     All questions were answered to patient's stated satisfaction. Encouraged patient to call with any new concerns or questions before his next visit to the cancer center and we can certain see him sooner, if needed.    Orders placed this encounter:  Orders Placed This Encounter  Procedures  . NM PET Image Restag (PS) Skull Base To Thigh  . DG Bone Survey Met  . Multiple Myeloma Panel (SPEP&IFE w/QIG)  . Kappa/lambda light chains  . Beta 2 microglobuline, serum  . CBC with Differential  . Comprehensive metabolic panel     Twana First, MD

## 2016-11-07 ENCOUNTER — Other Ambulatory Visit: Payer: Self-pay | Admitting: Family Medicine

## 2016-11-07 LAB — BETA 2 MICROGLOBULIN, SERUM: Beta-2 Microglobulin: 2.5 mg/L — ABNORMAL HIGH (ref 0.6–2.4)

## 2016-11-07 LAB — KAPPA/LAMBDA LIGHT CHAINS
Kappa free light chain: 33 mg/L — ABNORMAL HIGH (ref 3.3–19.4)
Kappa, lambda light chain ratio: 2.24 — ABNORMAL HIGH (ref 0.26–1.65)
Lambda free light chains: 14.7 mg/L (ref 5.7–26.3)

## 2016-11-07 NOTE — Telephone Encounter (Signed)
Ok to refill 

## 2016-11-07 NOTE — Telephone Encounter (Signed)
ok 

## 2016-11-07 NOTE — Telephone Encounter (Signed)
Medication called to pharmacy. 

## 2016-11-10 ENCOUNTER — Encounter: Payer: Self-pay | Admitting: Family Medicine

## 2016-11-10 ENCOUNTER — Ambulatory Visit (INDEPENDENT_AMBULATORY_CARE_PROVIDER_SITE_OTHER): Payer: Medicare Other | Admitting: Family Medicine

## 2016-11-10 VITALS — BP 106/70 | HR 74 | Temp 97.9°F | Resp 14 | Ht 69.0 in | Wt 241.0 lb

## 2016-11-10 DIAGNOSIS — N390 Urinary tract infection, site not specified: Secondary | ICD-10-CM | POA: Diagnosis not present

## 2016-11-10 DIAGNOSIS — R300 Dysuria: Secondary | ICD-10-CM | POA: Diagnosis not present

## 2016-11-10 DIAGNOSIS — R319 Hematuria, unspecified: Secondary | ICD-10-CM

## 2016-11-10 LAB — URINALYSIS, ROUTINE W REFLEX MICROSCOPIC
Bilirubin Urine: NEGATIVE
Glucose, UA: NEGATIVE
Hyaline Cast: NONE SEEN /LPF
Ketones, ur: NEGATIVE
Nitrite: NEGATIVE
Specific Gravity, Urine: 1.025 (ref 1.001–1.03)
WBC, UA: >=60 /HPF — AB (ref 0–5)
pH: 6.5 (ref 5.0–8.0)

## 2016-11-10 LAB — MULTIPLE MYELOMA PANEL, SERUM
Albumin SerPl Elph-Mcnc: 3.1 g/dL (ref 2.9–4.4)
Albumin/Glob SerPl: 1.1 (ref 0.7–1.7)
Alpha 1: 0.3 g/dL (ref 0.0–0.4)
Alpha2 Glob SerPl Elph-Mcnc: 0.8 g/dL (ref 0.4–1.0)
B-Globulin SerPl Elph-Mcnc: 1 g/dL (ref 0.7–1.3)
Gamma Glob SerPl Elph-Mcnc: 0.7 g/dL (ref 0.4–1.8)
Globulin, Total: 2.9 g/dL (ref 2.2–3.9)
IgA: 133 mg/dL (ref 61–437)
IgG (Immunoglobin G), Serum: 640 mg/dL — ABNORMAL LOW (ref 700–1600)
IgM (Immunoglobulin M), Srm: 34 mg/dL (ref 15–143)
M Protein SerPl Elph-Mcnc: 0.3 g/dL — ABNORMAL HIGH
Total Protein ELP: 6 g/dL (ref 6.0–8.5)

## 2016-11-10 LAB — MICROSCOPIC MESSAGE

## 2016-11-10 MED ORDER — CIPROFLOXACIN HCL 250 MG PO TABS: 250.0000 mg | ORAL_TABLET | Freq: Two times a day (BID) | ORAL | 0 refills | Status: DC

## 2016-11-10 NOTE — Progress Notes (Signed)
   Subjective:    Patient ID: Dean Danker., male    DOB: Jan 07, 1936, 81 y.o.   MRN: [id number]  Patient presents for Burning with urination  Pt here with dysuria for the past 2 weeks, no abdominal pain,no fever, no N/V, but feels "bad". He denies any gross blood. He has BPH on flomax.  He was seen by oncology as well- he has PET Scan for the probable new MM spot on lumbar spine seen on MRI He declines seeing neurosurgery and is just taking his pain  Medication and using topical pain relievers for now.     Review Of Systems:  GEN- + fatiguedenies , fever, weight loss,weakness, recent illness HEENT- denies eye drainage, change in vision, nasal discharge, CVS- denies chest pain, palpitations RESP- denies SOB, cough, wheeze ABD- denies N/V, change in stools, abd pain GU- +dysuria, denies hematuria, dribbling, incontinence MSK- +joint pain, muscle aches, injury Neuro- denies headache, dizziness, syncope, seizure activity       Objective:    BP 106/70   Pulse 74   Temp 97.9 F (36.6 C) (Oral)   Resp 14   Ht 5\' 9"  (1.753 m)   Wt 241 lb (109.3 kg)   BMI 35.59 kg/m  GEN- NAD, alert and oriented x3 HEENT-  MMM, oropharynx clear CVS- RRR, no murmur RESP-CTAB ABD-NABS,soft,NT,ND, no CVA tenderness  EXT- No edema Pulses- Radial 2+        Assessment & Plan:      Problem List Items Addressed This Visit    None    Visit Diagnoses    Urinary tract infection with hematuria, site unspecified    -  Primary   Treat wtih Cipro 250mg  BID x 7 days, send for culture, continue flomax, no systemic symptoms otherwise    Relevant Orders   Urinalysis, Routine w reflex microscopic (Completed)   Urine Culture      Note: This dictation was prepared with Dragon dictation along with smaller phrase technology. Any transcriptional errors that result from this process are unintentional.

## 2016-11-10 NOTE — Patient Instructions (Signed)
Take the antibiotics as prescribed F/U as needed

## 2016-11-12 LAB — URINE CULTURE
MICRO NUMBER:: 81116989
SPECIMEN QUALITY:: ADEQUATE

## 2016-11-14 ENCOUNTER — Other Ambulatory Visit (HOSPITAL_COMMUNITY): Payer: Self-pay | Admitting: *Deleted

## 2016-11-14 DIAGNOSIS — C9000 Multiple myeloma not having achieved remission: Secondary | ICD-10-CM

## 2016-11-17 ENCOUNTER — Ambulatory Visit (HOSPITAL_COMMUNITY): Payer: Medicare Other

## 2016-11-17 ENCOUNTER — Ambulatory Visit (HOSPITAL_COMMUNITY)
Admission: RE | Admit: 2016-11-17 | Discharge: 2016-11-17 | Disposition: A | Discharge status: Home / Self Care | Payer: Medicare Other | Source: Ambulatory Visit | Attending: Oncology | Admitting: Oncology

## 2016-11-17 ENCOUNTER — Other Ambulatory Visit (HOSPITAL_COMMUNITY): Payer: Self-pay | Admitting: Oncology

## 2016-11-17 DIAGNOSIS — K449 Diaphragmatic hernia without obstruction or gangrene: Secondary | ICD-10-CM | POA: Diagnosis not present

## 2016-11-17 DIAGNOSIS — R9389 Abnormal findings on diagnostic imaging of other specified body structures: Secondary | ICD-10-CM | POA: Diagnosis not present

## 2016-11-17 DIAGNOSIS — I2510 Atherosclerotic heart disease of native coronary artery without angina pectoris: Secondary | ICD-10-CM | POA: Diagnosis not present

## 2016-11-17 DIAGNOSIS — N400 Enlarged prostate without lower urinary tract symptoms: Secondary | ICD-10-CM | POA: Diagnosis not present

## 2016-11-17 DIAGNOSIS — C9000 Multiple myeloma not having achieved remission: Secondary | ICD-10-CM | POA: Diagnosis not present

## 2016-11-17 DIAGNOSIS — I700 Atherosclerosis of aorta: Secondary | ICD-10-CM | POA: Diagnosis not present

## 2016-11-17 DIAGNOSIS — K5730 Diverticulosis of large intestine without perforation or abscess without bleeding: Secondary | ICD-10-CM | POA: Diagnosis not present

## 2016-11-17 LAB — GLUCOSE, CAPILLARY: Glucose-Capillary: 94 mg/dL (ref 65–99)

## 2016-11-17 MED ORDER — FLUDEOXYGLUCOSE F - 18 (FDG) INJECTION
12.8000 | Freq: Once | INTRAVENOUS | Status: AC | PRN
  Administered 2016-11-17: 12.8 via INTRAVENOUS

## 2016-11-18 DIAGNOSIS — Z961 Presence of intraocular lens: Secondary | ICD-10-CM | POA: Diagnosis not present

## 2016-11-19 ENCOUNTER — Ambulatory Visit (HOSPITAL_COMMUNITY): Payer: Medicare Other

## 2016-11-19 ENCOUNTER — Encounter (HOSPITAL_COMMUNITY): Payer: Self-pay | Admitting: Oncology

## 2016-11-19 ENCOUNTER — Encounter (HOSPITAL_BASED_OUTPATIENT_CLINIC_OR_DEPARTMENT_OTHER): Payer: Medicare Other | Admitting: Oncology

## 2016-11-19 ENCOUNTER — Encounter: Payer: Self-pay | Admitting: Radiation Oncology

## 2016-11-19 VITALS — BP 129/59 | HR 79 | Resp 16 | Ht 69.0 in | Wt 243.2 lb

## 2016-11-19 DIAGNOSIS — C9000 Multiple myeloma not having achieved remission: Secondary | ICD-10-CM

## 2016-11-19 NOTE — Progress Notes (Signed)
[place], [place] [address]   CLINIC:  Medical Oncology/Hematology  PCP:  [name], MD [address] [place] [address] [phone]   REASON FOR VISIT:  Follow-up for multiple myeloma   CURRENT THERAPY: Zometa    BRIEF ONCOLOGIC HISTORY:    Multiple myeloma without remission ([place])   11/16/2012 Initial Diagnosis    L occipital condyle destructive lesion, not biopsied secondary to location. XRT      12/01/2012 Imaging    L3 superior endplate compression FX, no lytic lesions to suggest myeloma      02/06/2014 Imaging    soft tissue mass along the right posterior fifth rib with some rib destruction      02/16/2014 Miscellaneous    Normal CBC, Normal CMP, kappa,lamda ratio of 2.62 (abnormal), UIEP with slightly restricted band, IgG kappa, SPEP/IEP with monoclonal protein at 0.79 g/dl, normal igG, suppressed IGM      02/20/2014 Bone Marrow Biopsy    33% kappa restricted plasma cells Normal FISH, normal cytogenetics      02/21/2014 - 03/08/2014 Radiation Therapy    30Gy to R rib lesion, lesion not biopsied      03/16/2014 PET scan    Scattered hypermetabolic osseous lesions, including the calvarium, ribs, left scapula, sacrum, and left femoral shaft. An expansile left vertebral body lesion at L3 extends into the epidural space of the left lateral recess,       03/17/2014 - 04/07/2014 Chemotherapy    Velcade and Dexamethasone due to initial denial of Revlimid by insurance company.  Zometa monthly.      03/22/2014 Imaging    MR_ L-Spine- Enhancing lesions compatible with multiple myeloma at L2, L3, and S1-2.      04/07/2014 - 07/28/2014 Chemotherapy    RVD with Revlimid at 25 mg days 1-14.  Revlimid dose reduced to 25 mg every other day x 14 days with 7 day respite beginning on day 1 of cycle 2.      04/17/2014 Adverse Reaction    Revlimid-induced rash.  Treated with steroids.      07/28/2014 - 08/04/2014 Chemotherapy      Revlimid daily      08/04/2014 Adverse Reaction    Velcade-induced peripheral neuropathy      08/04/2014 Treatment Plan Change    D/C Velcade      08/11/2014 PET scan    Response to therapy. Improvement and resolution of foci of osseous hypermetabolism.      08/22/2014 - 08/28/2014 Chemotherapy    Revlimid 25 mg days 1-21 every 28 days      08/28/2014 Adverse Reaction    Revlimid-induced rash. Medication held.  Medrol dose Pak prescribed.      09/04/2014 - 01/08/2015 Chemotherapy    Revlimid 10 mg every other day, without dexamethasone      01/08/2015 - 01/15/2015 Hospital Admission    Colonic diverticular abscess with IR drain placement      03/02/2015 PET scan    Only bony uptake is low activity at site of deformity/callus at R second rib FX, high activity in sigmoid colon, advanced sigmoid divertidulosis. Abscess not resolved?      06/04/2015 Imaging    CT nonobstructive L renal calculus, diverticulosis of descending and sigmoid colon without inflammation, moderate prostatic enlargement      07/12/2015 Surgery    Diverticulitis s/p robotic sigmoid colectomy with Dr. Johney Maine      08/23/2015 PET scan  Primarily similar hypermetabolic osseous foci within the R sided ribs. new L third rib focus of hypermetab and sclerosis is favored to be related to healing FX. no soft tissue myeloma id'd. Presumed postop hypermetab and edema about the R pelvic wall       03/06/2016 PET scan    1. Reduced activity in the prior lesion such as the right second and fifth rib lesions, activity nearly completely resolved and significantly less than added mediastinal blood pool activity. No new lesions are identified. 2. Other imaging findings of potential clinical significance: Coronary, aortic arch, and branch vessel atherosclerotic vascular disease. Aortoiliac atherosclerotic vascular disease. Enlarged prostate gland. Colonic diverticula.        HISTORY OF PRESENT ILLNESS:  (From Dr.  Donald Pore last note dated 01/22/16)    INTERVAL HISTORY:  [name] reports to cancer center for follow-up of multiple myeloma and consideration for next dose of Zometa.  Patient presents today for continued follow-up and review of his recent myeloma workup. He states he continues to have arthritis in his left hip with pain which radiates down his left leg. He occasionally has sternal discomfort. Otherwise denies any chest pain, shortness breath, abdominal pain, focal weakness. He was recently diagnosed with a Klebsiella UTI and has been on ciprofloxacin, he states has 2 more days left. He continues to have fatigue and complains of constipation.    Skeletal survey 11/06/16: Chronic expansion of the right posterior fifth rib. No new areas of sclerosis or lie cysts to suggest active or progressive disease. Remote compression fractures of the lumbar spine.  Whole body PET 11/17/16: New hypermetabolic skeletal lesions in the left lower sternum and upper left sacral ala, compatible with multiple myeloma.  SPEP 11/06/16: 0.3 g/dL IgG monoclonal protein with kappa light chain specificity.  SPEP 09/18/16: 0.3 g/dL IgG monoclonal protein with kappa light chain specificity.  Kappa/lambda light chains 11/06/16: 2.24; up from 1.85 on 09/18/16.   REVIEW OF SYSTEMS:  Review of Systems  Constitutional: Positive for fatigue. Negative for chills, diaphoresis and fever.  HENT:  Negative.  Negative for lump/mass.   Eyes: Negative.   Respiratory: Negative.  Negative for cough and shortness of breath.   Cardiovascular: Negative for leg swelling.  Gastrointestinal: Positive for constipation. Negative for abdominal pain, blood in stool, diarrhea, nausea and vomiting.  Endocrine: Negative.   Genitourinary: Negative for dysuria.   Musculoskeletal: Positive for arthralgias and back pain. Negative for myalgias and neck pain.  Skin: Negative.  Negative for rash.  Neurological: Positive for numbness (chronic neuropathy;  on gabapentin).  Hematological: Negative.   Psychiatric/Behavioral: Positive for sleep disturbance (takes medication for sleep).     PAST MEDICAL/SURGICAL HISTORY:  Past Medical History:  Diagnosis Date  . BPH (benign prostatic hyperplasia)   . Colonic diverticular abscess 01/08/2015  . Diverticulitis 76/54/65   complicated by abscess and required percutaneous drainage  . GERD (gastroesophageal reflux disease)   . H/O ETOH abuse   . History of chemotherapy last done jan 2017  . History of radiation therapy 01/05/13-02/10/13   45 gray to left occipital condyle region  . Intra-abdominal abscess ([place])   . Multiple myeloma ([place]) 2015  . Neuropathy   . Radiation 02/21/14-03/08/14   right posterior chest wall area 30 gray  . Radiation 04/19/14-05/02/14   lumbar spine 25 gray  . Skull lesion    Left occipital condyle  . Wrist fracture, left    x 2   Past Surgical History:  Procedure Laterality Date  .  COLONOSCOPY N/A 04/19/2015   Procedure: COLONOSCOPY;  Surgeon: Danie Binder, MD;  Location: [facility];  Service: Endoscopy;  Laterality: N/A;  1:30 PM  . CYST EXCISION  1959   tail bone     SOCIAL HISTORY:  Social History   Social History  . Marital status: Married    Spouse name: N/A  . Number of children: 3  . Years of education: N/A   Occupational History  .  Retired   Social History Main Topics  . Smoking status: Never Smoker  . Smokeless tobacco: Never Used  . Alcohol use No     Comment: " Not much no more"  . Drug use: No  . Sexual activity: Not on file   Other Topics Concern  . Not on file   Social History Narrative  . No narrative on file    FAMILY HISTORY:  Family History  Problem Relation Age of Onset  . Diabetes Mother   . Stroke Mother   . Hypertension Father     CURRENT MEDICATIONS:  Outpatient Encounter Prescriptions as of 11/19/2016  Medication Sig  . ALPRAZolam (XANAX) 1 MG tablet Take 2 tablets (2 mg total) by mouth at bedtime as needed  for sleep. For anxiety and/or sleep  . aspirin EC 325 MG tablet Take 1 tablet (325 mg total) by mouth daily.  Marland Kitchen CALCIUM-VITAMIN D PO Take by mouth. 1500/1000 PO QD  . ciprofloxacin (CIPRO) 250 MG tablet Take 1 tablet (250 mg total) by mouth 2 (two) times daily.  . diclofenac sodium (VOLTAREN) 1 % GEL Apply to affected areas four times a day as needed  . EPINEPHrine 0.3 mg/0.3 mL IJ SOAJ injection Inject 0.3 mLs (0.3 mg total) into the muscle once.  . gabapentin (NEURONTIN) 400 MG capsule TAKE 5 CAPSULES BY MOUTH (2000 MG TOTAL)DAILY  . ibuprofen (ADVIL,MOTRIN) 800 MG tablet Take 1 tablet (800 mg total) by mouth every 8 (eight) hours as needed for moderate pain.  Marland Kitchen omeprazole (PRILOSEC) 20 MG capsule Take 1 capsule (20 mg total) by mouth daily. (Patient taking differently: Take 20 mg by mouth daily. Prn)  . oxyCODONE-acetaminophen (PERCOCET) 10-325 MG tablet Take 1 tablet by mouth 2 (two) times daily as needed for pain.  . tamsulosin (FLOMAX) 0.4 MG CAPS capsule TAKE 1 CAPSULE BY MOUTH AT BEDTIME  . traMADol (ULTRAM) 50 MG tablet TAKE 1 TABLET BY MOUTH EVERY 8 HOURS AS NEEDED  . zolendronic acid (ZOMETA) 4 MG/5ML injection Inject 4 mg into the vein. QOMonth   Facility-Administered Encounter Medications as of 11/19/2016  Medication  . sodium chloride 0.9 % injection 10 mL    ALLERGIES:  Allergies  Allergen Reactions  . Codeine Other (See Comments)    Severe headache  . Morphine And Related Other (See Comments)    Makes him feel weird  . Revlimid [Lenalidomide] Other (See Comments)    "Causes me to become weak"     PHYSICAL EXAM:  ECOG Performance status: 1 - Symptomatic, but independent.   Vitals:   11/19/16 1128  BP: (!) 129/59  Pulse: 79  Resp: 16  SpO2: 97%   Filed Weights   11/19/16 1128  Weight: 243 lb 3.2 oz (110.3 kg)     Physical Exam  Constitutional: He is oriented to person, place, and time and well-developed, well-nourished, and in no distress.  -Wears  sunglasses d/t light sensitivity  HENT:  Head: Normocephalic.  Mouth/Throat: Oropharynx is clear and moist.  Eyes: Conjunctivae are normal. No  scleral icterus.  Neck: Normal range of motion. Neck supple.  Cardiovascular: Normal rate and regular rhythm.   Pulmonary/Chest: Effort normal. No respiratory distress. He has no wheezes.  Abdominal: Soft. Bowel sounds are normal. There is no tenderness. There is no rebound and no guarding.  Musculoskeletal: Normal range of motion. He exhibits no edema.  Lymphadenopathy:    He has no cervical adenopathy.       Right: No supraclavicular adenopathy present.       Left: No supraclavicular adenopathy present.  Neurological: He is alert and oriented to person, place, and time. No cranial nerve deficit.  Skin: Skin is warm and dry. No rash noted.  Psychiatric: Mood, memory, affect and judgment normal.  Nursing note and vitals reviewed.    LABORATORY DATA:  I have reviewed the labs as listed.  CBC    Component Value Date/Time   WBC 8.1 11/06/2016 1618   RBC 5.03 11/06/2016 1618   HGB 14.9 11/06/2016 1618   HCT 44.8 11/06/2016 1618   PLT 161 11/06/2016 1618   MCV 89.1 11/06/2016 1618   MCH 29.6 11/06/2016 1618   MCHC 33.3 11/06/2016 1618   RDW 13.6 11/06/2016 1618   LYMPHSABS 2.0 11/06/2016 1618   MONOABS 0.7 11/06/2016 1618   EOSABS 0.0 11/06/2016 1618   BASOSABS 0.0 11/06/2016 1618   CMP Latest Ref Rng & Units 11/06/2016 10/16/2016 09/18/2016  Glucose 65 - 99 mg/dL 100(H) 103(H) 101(H)  BUN 6 - 20 mg/dL 22(H) 20 21(H)  Creatinine 0.61 - 1.24 mg/dL 0.90 0.82 0.73  Sodium 135 - 145 mmol/L 137 139 139  Potassium 3.5 - 5.1 mmol/L 4.0 4.0 3.8  Chloride 101 - 111 mmol/L 104 106 105  CO2 22 - 32 mmol/L 24 24 24   Calcium 8.9 - 10.3 mg/dL 9.5 8.8(L) 8.7(L)  Total Protein 6.5 - 8.1 g/dL 6.6 6.6 6.2(L)  Total Bilirubin 0.3 - 1.2 mg/dL 0.6 0.8 0.5  Alkaline Phos 38 - 126 U/L 24(L) 22(L) 20(L)  AST 15 - 41 U/L 16 18 18   ALT 17 - 63 U/L 12(L)  15(L) 14(L)      PENDING LABS:  Myeloma labs pending    DIAGNOSTIC IMAGING:  PET scan: 03/06/16      PATHOLOGY:  Bone marrow biopsy: 02/20/14   Cytogenetics: 02/20/14    ASSESSMENT & PLAN:   IgG kappa multiple myeloma:  -Reviewed recent myeloma workup with him in detail.   Skeletal survey 11/06/16: Chronic expansion of the right posterior fifth rib. No new areas of sclerosis or lie cysts to suggest active or progressive disease. Remote compression fractures of the lumbar spine.    Whole body PET 11/17/16: New hypermetabolic skeletal lesions in the left lower sternum and upper left sacral ala, compatible with multiple myeloma.    SPEP 11/06/16: 0.3 g/dL IgG monoclonal protein with kappa light chain specificity.    SPEP 09/18/16: 0.3 g/dL IgG monoclonal protein with kappa light chain specificity.    Kappa/lambda light chains 11/06/16: 2.24; up from 1.85 on 09/18/16. -M spike has been stable, but increase in kappa/lambda ratio. No evidence of worsening cytopenias, hypercalcemia, or worsening renal disease. -Will refer him to Dr. Sondra Come to see if he is a candidate for radiation to his new areas of bone disease in  the left lower sternum and upper left sacral ala. Continue zometa. -Patient did very poorly previously on treatment for his MM. If he should have evidence of new progressive disease beyond just bone disease, may do either single  agent velcade or revlimid in the future. -Repeat myeloma labs in 3 months for continued surveillance. Hypocalcemia:  -Continue calcium/vitamin D 1500/1000 PO daily. -Due to his persistent hypocalcemia despite replacement, we have moved his zometa out to every 8 weeks.   -Return to cancer center in 3 months for follow up with labs 1 week before.   All questions were answered to patient's stated satisfaction. Encouraged patient to call with any new concerns or questions before his next visit to the cancer center and we can certain see him sooner, if  needed.    Orders placed this encounter:  Orders Placed This Encounter  Procedures  . CBC with Differential  . Comprehensive metabolic panel  . Multiple Myeloma Panel (SPEP&IFE w/QIG)  . Kappa/lambda light chains  . Beta 2 microglobuline, serum     Twana First, MD 11/19/16 1:02 PM

## 2016-11-19 NOTE — Progress Notes (Signed)
Radiation Oncology         [phone]) [phone] ________________________________  Name: Dean [name]. MRN: [id number]  Date: 11/24/2016  DOB: [birth year]  Re-Consult Visit Note  CC: [name], MD  Twana First, MD    ICD-10-CM   1. Multiple myeloma without remission [facility]) C90.00     Diagnosis:   81 year-old gentleman with IgG kappa multiple myeloma without remission.   Interval Since Last Radiation:  2 years 7 months  04/19/2014-05/02/2014: Lumbar spine was treated to 25 Gy in 10 fractions. 02/21/2014-03/08/2014: Right posterior chest wall area to 30 Gy in 10 fractions. 01/05/2013-02/10/2013: Left occipital condyle region to 45 Gy in 25 fractions.   Narrative:  The patient returns today for re-consultation. The patient is kindly referred to our clinic by Dr. Talbert Cage for consideration of radiation treatment to new skeletal lesions. The patient was last seen by Dr. Talbert Cage on 11/19/16.    Bone survey on 11/06/16 showed chronic expansion of the right posterior fifth rib. No new areas of sclerosis or lie cysts to suggest active or progressive disease. Remote compression fractures of the lumber spine.   PET scan on 11/17/16 showed new hypermetabolic skeletal lesions in the left lower sternum and upper left sacral ala, compatible with multiple myeloma.   On review of systems, the patient reports that he is doing well overall. He denies any chest pain, shortness of breath, cough, fevers, chills, night sweats, unintended weight changes. He denies any bowel or bladder disturbances, and denies abdominal pain, nausea or vomiting. He reports 9/10 pain in his posterior left hip radiating to his left leg and down to his ankle. He also reports pain in his lower sternum. He is taking ibuprofen and tylenol. He also takes percocet as needed. States he took an oxycodone yesterday after taking three Tramadol which were not managing his pain. He states he hasn't used oxycodone in about one month prior. Reports he  fell in the Spring of this year due to leg weakness. A complete review of systems is obtained and is otherwise negative.                              ALLERGIES:  is allergic to codeine; morphine and related; and revlimid [lenalidomide].  Meds: Current Outpatient Prescriptions  Medication Sig Dispense Refill  . ALPRAZolam (XANAX) 1 MG tablet Take 2 tablets (2 mg total) by mouth at bedtime as needed for sleep. For anxiety and/or sleep 60 tablet 2  . aspirin EC 325 MG tablet Take 1 tablet (325 mg total) by mouth daily. 30 tablet 11  . CALCIUM-VITAMIN D PO Take by mouth. 1500/1000 PO QD    . diclofenac sodium (VOLTAREN) 1 % GEL Apply to affected areas four times a day as needed 1 Tube 3  . EPINEPHrine 0.3 mg/0.3 mL IJ SOAJ injection Inject 0.3 mLs (0.3 mg total) into the muscle once. 1 Device 0  . gabapentin (NEURONTIN) 400 MG capsule TAKE 5 CAPSULES BY MOUTH (2000 MG TOTAL)DAILY 150 capsule 3  . ibuprofen (ADVIL,MOTRIN) 800 MG tablet Take 1 tablet (800 mg total) by mouth every 8 (eight) hours as needed for moderate pain. 90 tablet 2  . omeprazole (PRILOSEC) 20 MG capsule Take 1 capsule (20 mg total) by mouth daily. (Patient taking differently: Take 20 mg by mouth daily. Prn) 30 capsule 5  . oxyCODONE-acetaminophen (PERCOCET) 10-325 MG tablet Take 1 tablet by mouth 2 (two) times daily as  needed for pain. 45 tablet 0  . tamsulosin (FLOMAX) 0.4 MG CAPS capsule TAKE 1 CAPSULE BY MOUTH AT BEDTIME 30 capsule 3  . traMADol (ULTRAM) 50 MG tablet TAKE 1 TABLET BY MOUTH EVERY 8 HOURS AS NEEDED 90 tablet 0  . zolendronic acid (ZOMETA) 4 MG/5ML injection Inject 4 mg into the vein. QOMonth    . ciprofloxacin (CIPRO) 250 MG tablet Take 1 tablet (250 mg total) by mouth 2 (two) times daily. (Patient not taking: Reported on 11/24/2016) 14 tablet 0   No current facility-administered medications for this encounter.    Facility-Administered Medications Ordered in Other Encounters  Medication Dose Route Frequency  Provider Last Rate Last Dose  . sodium chloride 0.9 % injection 10 mL  10 mL Intracatheter PRN [facility], PA-C   10 mL at 08/04/14 1103   REVIEW OF SYSTEMS: A 10+ POINT REVIEW OF SYSTEMS WAS OBTAINED including neurology, dermatology, psychiatry, cardiac, respiratory, lymph, extremities, GI, GU, musculoskeletal, constitutional, reproductive, HEENT. All pertinent positives are noted in the HPI. All others are negative.  Physical Findings: The patient is in no acute distress. Patient is alert and oriented. Ambulatory with cane.  height is _0  (1.753 m) and weight is 244 lb 9.6 oz (110.9 kg). His oral temperature is 97.6 F (36.4 C). His blood pressure is 117/76 and his pulse is 67. His oxygen saturation is 98%. .   Lungs are clear to auscultation bilaterally. Heart has regular rate and rhythm. No palpable cervical, supraclavicular, or axillary adenopathy. Abdomen soft, non-tender, normal bowel sounds. Lower motor strength is 5/5 in the proximal and distal muscle groups. Discomfort when  pressing on his lower sternum.   Lab Findings: Lab Results  Component Value Date   WBC 8.1 11/06/2016   HGB 14.9 11/06/2016   HCT 44.8 11/06/2016   MCV 89.1 11/06/2016   PLT 161 11/06/2016    Radiographic Findings: Nm Pet Image Restage (ps) Whole Body  Result Date: 11/17/2016 CLINICAL DATA:  Subsequent treatment strategy for multiple myeloma. New left S2 sacral lesion on lumbar spine MRI. EXAM: NUCLEAR MEDICINE PET WHOLE BODY TECHNIQUE: 12.8 mCi F-18 FDG was injected intravenously. Full-ring PET imaging was performed from the vertex to the feet after the radiotracer. CT data was obtained and used for attenuation correction and anatomic localization. FASTING BLOOD GLUCOSE:  Value: 94 mg/dl COMPARISON:  03/06/2016 PET-CT.  10/24/2016 lumbar spine MRI. FINDINGS: HEAD/NECK No hypermetabolic activity in the scalp. No hypermetabolic cervical lymph nodes. CHEST No enlarged or hypermetabolic axillary,  mediastinal or hilar lymph nodes. Stable subcentimeter calcified basilar right lower lobe granuloma. Stable parenchymal band with associated volume loss and distortion in the parahilar upper right lung, compatible with postinfectious/postinflammatory scarring. Scattered punctate tree-in-bud type opacities in the anterior left upper lobe and right middle lobe are unchanged and probably postinflammatory. No acute consolidative airspace disease, lung masses or new significant pulmonary nodules. No pneumothorax. No pleural effusions. Three-vessel coronary atherosclerosis. Atherosclerotic nonaneurysmal thoracic aorta. ABDOMEN/PELVIS No abnormal hypermetabolic activity within the liver, pancreas, adrenal glands, or spleen. No hypermetabolic lymph nodes in the abdomen or pelvis. Small hiatal hernia. Moderate diffuse colonic diverticulosis. Stable moderate prostatomegaly. Nonspecific gas in the nondependent bladder. Atherosclerotic nonaneurysmal abdominal aorta. Stable parapelvic renal cysts in the left kidney. Simple 2.1 cm lateral lower left renal cortical cyst. SKELETON New hypermetabolic lower left sternal osseous lesion with max SUV 8.2. New hypermetabolic upper left sacral ala osseous lesion with max SUV 6.7. No additional hypermetabolic osseous lesions. EXTREMITIES No abnormal hypermetabolic  activity in the lower extremities. IMPRESSION: 1. New hypermetabolic skeletal lesions in the left lower sternum and upper left sacral ala, compatible with multiple myeloma. 2. Nonspecific gas in the bladder, presumably due to recent bladder instrumentation. If there is no history of bladder instrumentation to explain this finding, recommend correlation with urinalysis to exclude a gas producing cystitis. 3. Chronic findings include Aortic Atherosclerosis (ICD10-I70.0), coronary atherosclerosis, moderate colonic diverticulosis, moderate prostatomegaly and small hiatal hernia. Electronically Signed   By: Ilona Sorrel M.D.   On:  11/17/2016 14:35   Dg Bone Survey Met  Result Date: 11/07/2016 CLINICAL DATA:  Multiple myeloma. EXAM: METASTATIC BONE SURVEY COMPARISON:  PET scan 03/06/2016. Lumbar spine radiographs 09/10/2016. FINDINGS: Expansion of the right posterior fifth rib is again noted. Advanced degenerative changes are noted in the cervical spine with leftward curvature. No other focal areas of sclerosis are present. Remote superior endplate fractures are present at L3 and L4. Atherosclerotic calcifications are present in the aorta. The lungs are clear. IMPRESSION: 1. Chronic expansion of the right posterior fifth rib. 2. No new areas of sclerosis or lie cysts to suggest active or progressive disease. 3. Remote compression fractures of the lumbar spine. 4. Aortic atherosclerosis. Electronically Signed   By: San Morelle M.D.   On: 11/07/2016 12:44    Impression:  81 year-old gentleman with IgG kappa multiple myeloma without remission.  He is having pain in the left lower pelvis and lower sternum area, areas showing uptake on his most recent PET scan. The patient will be a good candidate for a short course of palliative radiation therapy directed at these areas.  Today, I talked to the patient about the findings and work-up thus far.  We discussed the natural history of multiple myeloma and general treatment, highlighting the role of radiotherapy in the management.  We discussed the available radiation techniques, and focused on the details of logistics and delivery.  We reviewed the anticipated acute and late sequelae associated with radiation in this setting.  The patient was encouraged to ask questions that I answered to the best of my ability. The patient would like to proceed with radiation.  Plan:  He will proceed with CT simulation at 2 pm this afternoon. I anticipate 8 to 10 radiation treatments directed at the lower sternum and the left sacrum area.   -----------------------------------  Blair Promise,  PhD, MD  This document serves as a record of services personally performed by Gery Pray, MD. It was created on his behalf by Arlyce Harman, a trained medical scribe. The creation of this record is based on the scribe's personal observations and the provider's statements to them. This document has been checked and approved by the attending provider.

## 2016-11-21 NOTE — Progress Notes (Signed)
Histology and Location of Primary Cancer: multiple myeloma   Location(s) of Symptomatic Metastases: PET scan from 11/17/16 showed "new hypermetabolic skeletal lesions in the left lower sternum and upper left sacral ala, compatible with multiple myeloma."  Past/Anticipated chemotherapy by medical oncology, if any: Currently taking zometa.  May do either single agent velcade or revlimid in the future.  Pain on a scale of 0-10 is: 9/10 in his posterior left hip radiation to his leg and down to his ankle.  He is taking ibuprofen and tylenol.  He also takes percocet as needed.  Ambulatory status? Walker? Wheelchair?: ambulatory with a cane  SAFETY ISSUES:  Prior radiation?02/21/14-03/08/14 right posterior chest wall area 30 gray, 04/19/14-05/02/14 lumbar spine 25 gray  Pacemaker/ICD? no  Possible current pregnancy? no  Is the patient on methotrexate? no  Current Complaints / other details:    BP 117/76 (BP Location: Left Arm, Patient Position: Sitting)   Pulse 67   Temp 97.6 F (36.4 C) (Oral)   Ht 5' 9"  (1.753 m)   Wt 244 lb 9.6 oz (110.9 kg)   SpO2 98%   BMI 36.12 kg/m    Wt Readings from Last 3 Encounters:  11/24/16 244 lb 9.6 oz (110.9 kg)  11/19/16 243 lb 3.2 oz (110.3 kg)  11/10/16 241 lb (109.3 kg)

## 2016-11-24 ENCOUNTER — Ambulatory Visit
Admission: RE | Admit: 2016-11-24 | Discharge: 2016-11-24 | Disposition: A | Discharge status: Home / Self Care | Payer: Medicare Other | Source: Ambulatory Visit | Attending: Radiation Oncology | Admitting: Radiation Oncology

## 2016-11-24 ENCOUNTER — Encounter: Payer: Self-pay | Admitting: Radiation Oncology

## 2016-11-24 DIAGNOSIS — C9000 Multiple myeloma not having achieved remission: Secondary | ICD-10-CM | POA: Diagnosis not present

## 2016-11-24 DIAGNOSIS — G629 Polyneuropathy, unspecified: Secondary | ICD-10-CM | POA: Diagnosis not present

## 2016-11-24 DIAGNOSIS — Z79899 Other long term (current) drug therapy: Secondary | ICD-10-CM | POA: Insufficient documentation

## 2016-11-24 DIAGNOSIS — Z8781 Personal history of (healed) traumatic fracture: Secondary | ICD-10-CM | POA: Insufficient documentation

## 2016-11-24 DIAGNOSIS — Z823 Family history of stroke: Secondary | ICD-10-CM | POA: Insufficient documentation

## 2016-11-24 DIAGNOSIS — K219 Gastro-esophageal reflux disease without esophagitis: Secondary | ICD-10-CM | POA: Insufficient documentation

## 2016-11-24 DIAGNOSIS — Z885 Allergy status to narcotic agent status: Secondary | ICD-10-CM | POA: Insufficient documentation

## 2016-11-24 DIAGNOSIS — Z8249 Family history of ischemic heart disease and other diseases of the circulatory system: Secondary | ICD-10-CM | POA: Diagnosis not present

## 2016-11-24 DIAGNOSIS — Z510 Encounter for antineoplastic radiation therapy: Secondary | ICD-10-CM | POA: Diagnosis not present

## 2016-11-24 DIAGNOSIS — Z7982 Long term (current) use of aspirin: Secondary | ICD-10-CM | POA: Diagnosis not present

## 2016-11-24 DIAGNOSIS — Z9221 Personal history of antineoplastic chemotherapy: Secondary | ICD-10-CM | POA: Insufficient documentation

## 2016-11-24 DIAGNOSIS — M899 Disorder of bone, unspecified: Secondary | ICD-10-CM | POA: Diagnosis not present

## 2016-11-24 DIAGNOSIS — R0789 Other chest pain: Secondary | ICD-10-CM | POA: Diagnosis not present

## 2016-11-24 DIAGNOSIS — Z833 Family history of diabetes mellitus: Secondary | ICD-10-CM | POA: Diagnosis not present

## 2016-11-24 DIAGNOSIS — R102 Pelvic and perineal pain: Secondary | ICD-10-CM | POA: Diagnosis not present

## 2016-11-24 DIAGNOSIS — R51 Headache: Secondary | ICD-10-CM | POA: Insufficient documentation

## 2016-11-24 DIAGNOSIS — Z888 Allergy status to other drugs, medicaments and biological substances status: Secondary | ICD-10-CM | POA: Insufficient documentation

## 2016-11-24 DIAGNOSIS — Z923 Personal history of irradiation: Secondary | ICD-10-CM | POA: Insufficient documentation

## 2016-11-24 DIAGNOSIS — N400 Enlarged prostate without lower urinary tract symptoms: Secondary | ICD-10-CM | POA: Insufficient documentation

## 2016-11-24 DIAGNOSIS — C7951 Secondary malignant neoplasm of bone: Secondary | ICD-10-CM | POA: Diagnosis not present

## 2016-11-24 NOTE — Progress Notes (Signed)
  Radiation Oncology         [phone]) [phone] ________________________________  Name: Dean [name]. MRN: [id number]  Date: 11/24/2016  DOB: Jul 01, 1935  SIMULATION AND TREATMENT PLANNING NOTE    ICD-10-CM   1. Multiple myeloma without remission (HCC) C90.00     DIAGNOSIS:  81 year-old gentleman with IgG kappa multiple myeloma without remission.  NARRATIVE:  The patient was brought to the [facility].  Identity was confirmed.  All relevant records and images related to the planned course of therapy were reviewed.  The patient freely provided informed written consent to proceed with treatment after reviewing the details related to the planned course of therapy. The consent form was witnessed and verified by the simulation staff.  Then, the patient was set-up in a stable reproducible  supine position for radiation therapy.  CT images were obtained.  Surface markings were placed.  The CT images were loaded into the planning software.  Then the target and avoidance structures were contoured.  Treatment planning then occurred.  The radiation prescription was entered and confirmed.  Then, I designed and supervised the construction of a total of 7 medically necessary complex treatment devices.  I have requested : Isodose Plan.  I have ordered:dose calc.  PLAN:  The patient will receive 24 Gy in 8 fractions directed at the lower left sternum and left sacrum area.  -----------------------------------  Blair Promise, PhD, MD  This document serves as a record of services personally performed by Gery Pray, MD. It was created on his behalf by Arlyce Harman, a trained medical scribe. The creation of this record is based on the scribe's personal observations and the provider's statements to them. This document has been checked and approved by the attending provider.

## 2016-11-26 DIAGNOSIS — C9000 Multiple myeloma not having achieved remission: Secondary | ICD-10-CM | POA: Diagnosis not present

## 2016-11-26 DIAGNOSIS — Z7982 Long term (current) use of aspirin: Secondary | ICD-10-CM | POA: Diagnosis not present

## 2016-11-26 DIAGNOSIS — M899 Disorder of bone, unspecified: Secondary | ICD-10-CM | POA: Diagnosis not present

## 2016-11-26 DIAGNOSIS — Z510 Encounter for antineoplastic radiation therapy: Secondary | ICD-10-CM | POA: Diagnosis not present

## 2016-11-26 DIAGNOSIS — R0789 Other chest pain: Secondary | ICD-10-CM | POA: Diagnosis not present

## 2016-11-26 DIAGNOSIS — R102 Pelvic and perineal pain: Secondary | ICD-10-CM | POA: Diagnosis not present

## 2016-11-26 DIAGNOSIS — Z79899 Other long term (current) drug therapy: Secondary | ICD-10-CM | POA: Diagnosis not present

## 2016-12-01 ENCOUNTER — Telehealth: Payer: Self-pay | Admitting: Oncology

## 2016-12-01 ENCOUNTER — Ambulatory Visit
Admission: RE | Admit: 2016-12-01 | Discharge: 2016-12-01 | Disposition: A | Discharge status: Home / Self Care | Payer: Medicare Other | Source: Ambulatory Visit | Attending: Radiation Oncology | Admitting: Radiation Oncology

## 2016-12-01 DIAGNOSIS — M899 Disorder of bone, unspecified: Secondary | ICD-10-CM | POA: Diagnosis not present

## 2016-12-01 DIAGNOSIS — C9000 Multiple myeloma not having achieved remission: Secondary | ICD-10-CM | POA: Diagnosis not present

## 2016-12-01 DIAGNOSIS — R0789 Other chest pain: Secondary | ICD-10-CM | POA: Diagnosis not present

## 2016-12-01 DIAGNOSIS — Z7982 Long term (current) use of aspirin: Secondary | ICD-10-CM | POA: Diagnosis not present

## 2016-12-01 DIAGNOSIS — Z510 Encounter for antineoplastic radiation therapy: Secondary | ICD-10-CM | POA: Diagnosis not present

## 2016-12-01 DIAGNOSIS — R102 Pelvic and perineal pain: Secondary | ICD-10-CM | POA: Diagnosis not present

## 2016-12-01 DIAGNOSIS — Z79899 Other long term (current) drug therapy: Secondary | ICD-10-CM | POA: Diagnosis not present

## 2016-12-01 NOTE — Progress Notes (Signed)
  Radiation Oncology         [phone]) [phone] ________________________________  Name: Dean [name]. MRN: [id number]  Date: 12/01/2016  DOB: December 23, 1935  Simulation Verification Note    ICD-10-CM   1. Multiple myeloma without remission ([facility]) C90.00     Status: outpatient  NARRATIVE: The patient was brought to the treatment unit and placed in the planned treatment position. The clinical setup was verified. Then port films were obtained and uploaded to the radiation oncology medical record software.  The treatment beams were carefully compared against the planned radiation fields. The position location and shape of the radiation fields was reviewed. They targeted volume of tissue appears to be appropriately covered by the radiation beams. Organs at risk appear to be excluded as planned.  Based on my personal review, I approved the simulation verification. The patient's treatment will proceed as planned.  -----------------------------------  Blair Promise, PhD, MD  This document serves as a record of services personally performed by Gery Pray, MD. It was created on his behalf by Marlowe Kays, a trained medical scribe. The creation of this record is based on the scribe's personal observations and the provider's statements to them. This document has been checked and approved by the attending provider.

## 2016-12-01 NOTE — Telephone Encounter (Signed)
Patient called and said the red markings in the markers for radiation have faded.  He said he still has black dots in the centers of the markers.  Advised him that that is OK and he will be able to have treatment today.  Notified Melissa, RTT on [facility] [facility].

## 2016-12-02 ENCOUNTER — Ambulatory Visit
Admission: RE | Admit: 2016-12-02 | Discharge: 2016-12-02 | Disposition: A | Discharge status: Home / Self Care | Payer: Medicare Other | Source: Ambulatory Visit | Attending: Radiation Oncology | Admitting: Radiation Oncology

## 2016-12-02 DIAGNOSIS — C9000 Multiple myeloma not having achieved remission: Secondary | ICD-10-CM | POA: Diagnosis not present

## 2016-12-02 DIAGNOSIS — Z79899 Other long term (current) drug therapy: Secondary | ICD-10-CM | POA: Diagnosis not present

## 2016-12-02 DIAGNOSIS — M899 Disorder of bone, unspecified: Secondary | ICD-10-CM | POA: Diagnosis not present

## 2016-12-02 DIAGNOSIS — R0789 Other chest pain: Secondary | ICD-10-CM | POA: Diagnosis not present

## 2016-12-02 DIAGNOSIS — R102 Pelvic and perineal pain: Secondary | ICD-10-CM | POA: Diagnosis not present

## 2016-12-02 DIAGNOSIS — Z510 Encounter for antineoplastic radiation therapy: Secondary | ICD-10-CM | POA: Diagnosis not present

## 2016-12-02 DIAGNOSIS — Z7982 Long term (current) use of aspirin: Secondary | ICD-10-CM | POA: Diagnosis not present

## 2016-12-03 ENCOUNTER — Ambulatory Visit
Admission: RE | Admit: 2016-12-03 | Discharge: 2016-12-03 | Disposition: A | Discharge status: Home / Self Care | Payer: Medicare Other | Source: Ambulatory Visit | Attending: Radiation Oncology | Admitting: Radiation Oncology

## 2016-12-03 DIAGNOSIS — Z79899 Other long term (current) drug therapy: Secondary | ICD-10-CM | POA: Diagnosis not present

## 2016-12-03 DIAGNOSIS — Z510 Encounter for antineoplastic radiation therapy: Secondary | ICD-10-CM | POA: Diagnosis not present

## 2016-12-03 DIAGNOSIS — Z7982 Long term (current) use of aspirin: Secondary | ICD-10-CM | POA: Diagnosis not present

## 2016-12-03 DIAGNOSIS — R102 Pelvic and perineal pain: Secondary | ICD-10-CM | POA: Diagnosis not present

## 2016-12-03 DIAGNOSIS — M899 Disorder of bone, unspecified: Secondary | ICD-10-CM | POA: Diagnosis not present

## 2016-12-03 DIAGNOSIS — R0789 Other chest pain: Secondary | ICD-10-CM | POA: Diagnosis not present

## 2016-12-03 DIAGNOSIS — C9000 Multiple myeloma not having achieved remission: Secondary | ICD-10-CM | POA: Diagnosis not present

## 2016-12-04 ENCOUNTER — Ambulatory Visit
Admission: RE | Admit: 2016-12-04 | Discharge: 2016-12-04 | Disposition: A | Discharge status: Home / Self Care | Payer: Medicare Other | Source: Ambulatory Visit | Attending: Radiation Oncology | Admitting: Radiation Oncology

## 2016-12-04 DIAGNOSIS — Z79899 Other long term (current) drug therapy: Secondary | ICD-10-CM | POA: Diagnosis not present

## 2016-12-04 DIAGNOSIS — R102 Pelvic and perineal pain: Secondary | ICD-10-CM | POA: Diagnosis not present

## 2016-12-04 DIAGNOSIS — R0789 Other chest pain: Secondary | ICD-10-CM | POA: Diagnosis not present

## 2016-12-04 DIAGNOSIS — C9000 Multiple myeloma not having achieved remission: Secondary | ICD-10-CM | POA: Diagnosis not present

## 2016-12-04 DIAGNOSIS — M899 Disorder of bone, unspecified: Secondary | ICD-10-CM | POA: Diagnosis not present

## 2016-12-04 DIAGNOSIS — Z510 Encounter for antineoplastic radiation therapy: Secondary | ICD-10-CM | POA: Diagnosis not present

## 2016-12-04 DIAGNOSIS — Z7982 Long term (current) use of aspirin: Secondary | ICD-10-CM | POA: Diagnosis not present

## 2016-12-05 ENCOUNTER — Ambulatory Visit
Admission: RE | Admit: 2016-12-05 | Discharge: 2016-12-05 | Disposition: A | Discharge status: Home / Self Care | Payer: Medicare Other | Source: Ambulatory Visit | Attending: Radiation Oncology | Admitting: Radiation Oncology

## 2016-12-05 DIAGNOSIS — Z510 Encounter for antineoplastic radiation therapy: Secondary | ICD-10-CM | POA: Diagnosis not present

## 2016-12-05 DIAGNOSIS — Z7982 Long term (current) use of aspirin: Secondary | ICD-10-CM | POA: Diagnosis not present

## 2016-12-05 DIAGNOSIS — C9000 Multiple myeloma not having achieved remission: Secondary | ICD-10-CM | POA: Diagnosis not present

## 2016-12-05 DIAGNOSIS — M899 Disorder of bone, unspecified: Secondary | ICD-10-CM | POA: Diagnosis not present

## 2016-12-05 DIAGNOSIS — Z79899 Other long term (current) drug therapy: Secondary | ICD-10-CM | POA: Diagnosis not present

## 2016-12-05 DIAGNOSIS — R102 Pelvic and perineal pain: Secondary | ICD-10-CM | POA: Diagnosis not present

## 2016-12-05 DIAGNOSIS — R0789 Other chest pain: Secondary | ICD-10-CM | POA: Diagnosis not present

## 2016-12-08 ENCOUNTER — Other Ambulatory Visit: Payer: Self-pay | Admitting: Family Medicine

## 2016-12-08 ENCOUNTER — Other Ambulatory Visit (HOSPITAL_COMMUNITY): Payer: Self-pay | Admitting: Oncology

## 2016-12-08 ENCOUNTER — Ambulatory Visit
Admission: RE | Admit: 2016-12-08 | Discharge: 2016-12-08 | Disposition: A | Discharge status: Home / Self Care | Payer: Medicare Other | Source: Ambulatory Visit | Attending: Radiation Oncology | Admitting: Radiation Oncology

## 2016-12-08 DIAGNOSIS — Z510 Encounter for antineoplastic radiation therapy: Secondary | ICD-10-CM | POA: Diagnosis not present

## 2016-12-08 DIAGNOSIS — M899 Disorder of bone, unspecified: Secondary | ICD-10-CM | POA: Diagnosis not present

## 2016-12-08 DIAGNOSIS — C9000 Multiple myeloma not having achieved remission: Secondary | ICD-10-CM | POA: Diagnosis not present

## 2016-12-08 DIAGNOSIS — R0789 Other chest pain: Secondary | ICD-10-CM | POA: Diagnosis not present

## 2016-12-08 DIAGNOSIS — Z79899 Other long term (current) drug therapy: Secondary | ICD-10-CM | POA: Diagnosis not present

## 2016-12-08 DIAGNOSIS — R102 Pelvic and perineal pain: Secondary | ICD-10-CM | POA: Diagnosis not present

## 2016-12-08 DIAGNOSIS — Z7982 Long term (current) use of aspirin: Secondary | ICD-10-CM | POA: Diagnosis not present

## 2016-12-09 ENCOUNTER — Ambulatory Visit
Admission: RE | Admit: 2016-12-09 | Discharge: 2016-12-09 | Disposition: A | Discharge status: Home / Self Care | Payer: Medicare Other | Source: Ambulatory Visit | Attending: Radiation Oncology | Admitting: Radiation Oncology

## 2016-12-09 ENCOUNTER — Other Ambulatory Visit: Payer: Self-pay | Admitting: Radiation Oncology

## 2016-12-09 DIAGNOSIS — R0789 Other chest pain: Secondary | ICD-10-CM | POA: Diagnosis not present

## 2016-12-09 DIAGNOSIS — M899 Disorder of bone, unspecified: Secondary | ICD-10-CM | POA: Diagnosis not present

## 2016-12-09 DIAGNOSIS — Z79899 Other long term (current) drug therapy: Secondary | ICD-10-CM | POA: Diagnosis not present

## 2016-12-09 DIAGNOSIS — Z510 Encounter for antineoplastic radiation therapy: Secondary | ICD-10-CM | POA: Diagnosis not present

## 2016-12-09 DIAGNOSIS — C9000 Multiple myeloma not having achieved remission: Secondary | ICD-10-CM | POA: Diagnosis not present

## 2016-12-09 DIAGNOSIS — R102 Pelvic and perineal pain: Secondary | ICD-10-CM | POA: Diagnosis not present

## 2016-12-09 DIAGNOSIS — Z7982 Long term (current) use of aspirin: Secondary | ICD-10-CM | POA: Diagnosis not present

## 2016-12-09 MED ORDER — OXYCODONE-ACETAMINOPHEN 10-325 MG PO TABS: 1.0000 | ORAL_TABLET | Freq: Two times a day (BID) | ORAL | 0 refills | Status: DC | PRN

## 2016-12-09 NOTE — Telephone Encounter (Signed)
Ok to refill 

## 2016-12-09 NOTE — Telephone Encounter (Signed)
ok 

## 2016-12-10 ENCOUNTER — Ambulatory Visit
Admission: RE | Admit: 2016-12-10 | Discharge: 2016-12-10 | Disposition: A | Discharge status: Home / Self Care | Payer: Medicare Other | Source: Ambulatory Visit | Attending: Radiation Oncology | Admitting: Radiation Oncology

## 2016-12-10 DIAGNOSIS — M899 Disorder of bone, unspecified: Secondary | ICD-10-CM | POA: Diagnosis not present

## 2016-12-10 DIAGNOSIS — C9000 Multiple myeloma not having achieved remission: Secondary | ICD-10-CM | POA: Diagnosis not present

## 2016-12-10 DIAGNOSIS — Z79899 Other long term (current) drug therapy: Secondary | ICD-10-CM | POA: Diagnosis not present

## 2016-12-10 DIAGNOSIS — R0789 Other chest pain: Secondary | ICD-10-CM | POA: Diagnosis not present

## 2016-12-10 DIAGNOSIS — R102 Pelvic and perineal pain: Secondary | ICD-10-CM | POA: Diagnosis not present

## 2016-12-10 DIAGNOSIS — Z510 Encounter for antineoplastic radiation therapy: Secondary | ICD-10-CM | POA: Diagnosis not present

## 2016-12-10 DIAGNOSIS — Z7982 Long term (current) use of aspirin: Secondary | ICD-10-CM | POA: Diagnosis not present

## 2016-12-15 ENCOUNTER — Other Ambulatory Visit (HOSPITAL_COMMUNITY): Payer: Self-pay | Admitting: *Deleted

## 2016-12-15 DIAGNOSIS — D696 Thrombocytopenia, unspecified: Secondary | ICD-10-CM

## 2016-12-16 ENCOUNTER — Encounter (HOSPITAL_BASED_OUTPATIENT_CLINIC_OR_DEPARTMENT_OTHER): Payer: Medicare Other

## 2016-12-16 ENCOUNTER — Encounter (HOSPITAL_COMMUNITY): Payer: Medicare Other | Attending: Oncology

## 2016-12-16 VITALS — BP 115/60 | HR 55 | Temp 97.7°F | Resp 18 | Wt 243.0 lb

## 2016-12-16 DIAGNOSIS — D696 Thrombocytopenia, unspecified: Secondary | ICD-10-CM | POA: Insufficient documentation

## 2016-12-16 DIAGNOSIS — C9000 Multiple myeloma not having achieved remission: Secondary | ICD-10-CM | POA: Diagnosis present

## 2016-12-16 LAB — CBC WITH DIFFERENTIAL/PLATELET
Basophils Absolute: 0 10*3/uL (ref 0.0–0.1)
Basophils Relative: 0 %
Eosinophils Absolute: 0.1 10*3/uL (ref 0.0–0.7)
Eosinophils Relative: 1 %
HCT: 41.6 % (ref 39.0–52.0)
Hemoglobin: 14.1 g/dL (ref 13.0–17.0)
Lymphocytes Relative: 19 %
Lymphs Abs: 0.9 10*3/uL (ref 0.7–4.0)
MCH: 30.7 pg (ref 26.0–34.0)
MCHC: 33.9 g/dL (ref 30.0–36.0)
MCV: 90.4 fL (ref 78.0–100.0)
Monocytes Absolute: 0.5 10*3/uL (ref 0.1–1.0)
Monocytes Relative: 10 %
Neutro Abs: 3.6 10*3/uL (ref 1.7–7.7)
Neutrophils Relative %: 70 %
Platelets: 91 10*3/uL — ABNORMAL LOW (ref 150–400)
RBC: 4.6 MIL/uL (ref 4.22–5.81)
RDW: 13.6 % (ref 11.5–15.5)
WBC: 5.1 10*3/uL (ref 4.0–10.5)

## 2016-12-16 LAB — COMPREHENSIVE METABOLIC PANEL
ALT: 11 U/L — ABNORMAL LOW (ref 17–63)
AST: 15 U/L (ref 15–41)
Albumin: 3.6 g/dL (ref 3.5–5.0)
Alkaline Phosphatase: 19 U/L — ABNORMAL LOW (ref 38–126)
Anion gap: 5 (ref 5–15)
BUN: 19 mg/dL (ref 6–20)
CO2: 27 mmol/L (ref 22–32)
Calcium: 8.8 mg/dL — ABNORMAL LOW (ref 8.9–10.3)
Chloride: 105 mmol/L (ref 101–111)
Creatinine, Ser: 0.74 mg/dL (ref 0.61–1.24)
GFR calc Af Amer: >60 mL/min (ref >60)
GFR calc non Af Amer: >60 mL/min (ref >60)
Glucose, Bld: 99 mg/dL (ref 65–99)
Potassium: 3.9 mmol/L (ref 3.5–5.1)
Sodium: 137 mmol/L (ref 135–145)
Total Bilirubin: 0.7 mg/dL (ref 0.3–1.2)
Total Protein: 6 g/dL — ABNORMAL LOW (ref 6.5–8.1)

## 2016-12-16 MED ORDER — SODIUM CHLORIDE 0.9 % IV SOLN
Freq: Once | INTRAVENOUS | Status: AC
  Administered 2016-12-16: 11:00:00 via INTRAVENOUS

## 2016-12-16 MED ORDER — ZOLEDRONIC ACID 4 MG/100ML IV SOLN
4.0000 mg | Freq: Once | INTRAVENOUS | Status: AC
  Administered 2016-12-16: 4 mg via INTRAVENOUS
  Filled 2016-12-16: qty 100

## 2016-12-16 NOTE — Patient Instructions (Signed)
[place] at [facility] Discharge Instructions  RECOMMENDATIONS MADE BY THE CONSULTANT AND ANY TEST RESULTS WILL BE SENT TO YOUR REFERRING PHYSICIAN.  zometa given today  Follow up as scheduled.  Thank you for choosing [place] at [facility] to provide your oncology and hematology care.  To afford each patient quality time with our provider, please arrive at least 15 minutes before your scheduled appointment time.    If you have a lab appointment with the [facility] please come in thru the  Main Entrance and check in at the main information desk  You need to re-schedule your appointment should you arrive 10 or more minutes late.  We strive to give you quality time with our providers, and arriving late affects you and other patients whose appointments are after yours.  Also, if you no show three or more times for appointments you may be dismissed from the clinic at the providers discretion.     Again, thank you for choosing [facility].  Our hope is that these requests will decrease the amount of time that you wait before being seen by our physicians.       _____________________________________________________________  Should you have questions after your visit to [facility], please contact our office at [phone] between the hours of 8:30 a.m. and 4:30 p.m.  Voicemails left after 4:30 p.m. will not be returned until the following business day.  For prescription refill requests, have your pharmacy contact our office.       Resources For Cancer Patients and their Caregivers ? American Cancer Society: Can assist with transportation, wigs, general needs, runs Look Good Feel Better.        6814538912 ? Cancer Care: Provides financial assistance, online support groups, medication/co-pay assistance.  1-800-813-HOPE 831-411-5967) ? [facility] Assists [place] cancer patients and  their families through emotional , educational and financial support.  857 047 9224 ? [place] DSS Where to apply for food stamps, Medicaid and utility assistance. 551-455-7557 ? RCATS: Transportation to medical appointments. 539-607-5061 ? Social Security Administration: May apply for disability if have a Stage IV cancer. 262-861-9088 843-018-7139 ? [employer], Disability and Transit Services: Assists with nutrition, care and transit needs. [place] Support Programs: @10RELATIVEDAYS @ > Cancer Support Group  2nd Tuesday of the month 1pm-2pm, Journey Room  > Creative Journey  3rd Tuesday of the month 1130am-1pm, Journey Room  > Look Good Feel Better  1st Wednesday of the month 10am-12 noon, Journey Room (Call [place] to register [phone])

## 2016-12-17 ENCOUNTER — Encounter: Payer: Self-pay | Admitting: Radiation Oncology

## 2016-12-17 NOTE — Progress Notes (Signed)
  Radiation Oncology         [phone]) [phone] ________________________________  Name: Dean [name]. MRN: [id number]  Date: 12/17/2016  DOB: 12/06/1935  End of Treatment Note  Diagnosis: 81 year-old gentleman with IgG kappa multiple myeloma without remission.   Indication for treatment:  Palliative        Radiation treatment dates:   12/01/16-12/10/16   Site/dose:  1. Parasternal nodule, chest- 24 Gy total delivered in 8 fractions  2. Left sacro-iliac, pelvis- 24 Gy total delivered in 8 fractions   Beams/energy:    1. Isodose Plan Photon, 6X/10X 2. Isodose Plan Photon, 15X  Narrative: The patient tolerated radiation treatment relatively well.  Initially, the patient only had mild pain within his treatment sites, however, this worsened throughout his treatments and he had to take Oxycodone for control. Hopefully his pain will improve over the next several weeks  Plan: The patient has completed radiation treatment. The patient will return to radiation oncology clinic for routine followup in one month. I advised them to call or return sooner if they have any questions or concerns related to their recovery or treatment.  -----------------------------------  Blair Promise, PhD, MD  This document serves as a record of services personally performed by Gery Pray, MD. It was created on his behalf by Reola Mosher, a trained medical scribe. The creation of this record is based on the scribe's personal observations and the provider's statements to them. This document has been checked and approved by the attending provider.

## 2017-01-13 ENCOUNTER — Encounter (HOSPITAL_COMMUNITY): Payer: Self-pay

## 2017-01-13 ENCOUNTER — Encounter (HOSPITAL_COMMUNITY): Payer: Medicare Other

## 2017-01-13 ENCOUNTER — Encounter (HOSPITAL_COMMUNITY): Payer: Medicare Other | Attending: Oncology

## 2017-01-13 ENCOUNTER — Other Ambulatory Visit: Payer: Self-pay

## 2017-01-13 VITALS — BP 131/71 | HR 68 | Temp 98.6°F | Resp 16

## 2017-01-13 DIAGNOSIS — C9000 Multiple myeloma not having achieved remission: Secondary | ICD-10-CM | POA: Diagnosis present

## 2017-01-13 DIAGNOSIS — D696 Thrombocytopenia, unspecified: Secondary | ICD-10-CM | POA: Insufficient documentation

## 2017-01-13 LAB — COMPREHENSIVE METABOLIC PANEL
ALT: 14 U/L — ABNORMAL LOW (ref 17–63)
AST: 19 U/L (ref 15–41)
Albumin: 4 g/dL (ref 3.5–5.0)
Alkaline Phosphatase: 21 U/L — ABNORMAL LOW (ref 38–126)
Anion gap: 8 (ref 5–15)
BUN: 22 mg/dL — ABNORMAL HIGH (ref 6–20)
CO2: 25 mmol/L (ref 22–32)
Calcium: 9 mg/dL (ref 8.9–10.3)
Chloride: 105 mmol/L (ref 101–111)
Creatinine, Ser: 0.86 mg/dL (ref 0.61–1.24)
GFR calc Af Amer: >60 mL/min (ref >60)
GFR calc non Af Amer: >60 mL/min (ref >60)
Glucose, Bld: 106 mg/dL — ABNORMAL HIGH (ref 65–99)
Potassium: 4 mmol/L (ref 3.5–5.1)
Sodium: 138 mmol/L (ref 135–145)
Total Bilirubin: 0.8 mg/dL (ref 0.3–1.2)
Total Protein: 6.8 g/dL (ref 6.5–8.1)

## 2017-01-13 LAB — CBC WITH DIFFERENTIAL/PLATELET
Basophils Absolute: 0 10*3/uL (ref 0.0–0.1)
Basophils Relative: 0 %
Eosinophils Absolute: 0 10*3/uL (ref 0.0–0.7)
Eosinophils Relative: 0 %
HCT: 46.2 % (ref 39.0–52.0)
Hemoglobin: 15.1 g/dL (ref 13.0–17.0)
Lymphocytes Relative: 25 %
Lymphs Abs: 1.2 10*3/uL (ref 0.7–4.0)
MCH: 30.6 pg (ref 26.0–34.0)
MCHC: 32.7 g/dL (ref 30.0–36.0)
MCV: 93.5 fL (ref 78.0–100.0)
Monocytes Absolute: 0.4 10*3/uL (ref 0.1–1.0)
Monocytes Relative: 8 %
Neutro Abs: 3 10*3/uL (ref 1.7–7.7)
Neutrophils Relative %: 67 %
Platelets: 123 10*3/uL — ABNORMAL LOW (ref 150–400)
RBC: 4.94 MIL/uL (ref 4.22–5.81)
RDW: 14.1 % (ref 11.5–15.5)
WBC: 4.5 10*3/uL (ref 4.0–10.5)

## 2017-01-13 MED ORDER — ZOLEDRONIC ACID 4 MG/5ML IV CONC
4.0000 mg | Freq: Once | INTRAVENOUS | Status: AC
  Administered 2017-01-13: 4 mg via INTRAVENOUS
  Filled 2017-01-13: qty 5

## 2017-01-13 MED ORDER — SODIUM CHLORIDE 0.9 % IV SOLN
Freq: Once | INTRAVENOUS | Status: AC
  Administered 2017-01-13: 11:00:00 via INTRAVENOUS

## 2017-01-13 NOTE — Patient Instructions (Signed)
[place] at [facility] Discharge Instructions  RECOMMENDATIONS MADE BY THE CONSULTANT AND ANY TEST RESULTS WILL BE SENT TO YOUR REFERRING PHYSICIAN.  zometa today Follow up as scheduled.  Thank you for choosing [place] at [facility] to provide your oncology and hematology care.  To afford each patient quality time with our provider, please arrive at least 15 minutes before your scheduled appointment time.    If you have a lab appointment with the [facility] please come in thru the  Main Entrance and check in at the main information desk  You need to re-schedule your appointment should you arrive 10 or more minutes late.  We strive to give you quality time with our providers, and arriving late affects you and other patients whose appointments are after yours.  Also, if you no show three or more times for appointments you may be dismissed from the clinic at the providers discretion.     Again, thank you for choosing [facility].  Our hope is that these requests will decrease the amount of time that you wait before being seen by our physicians.       _____________________________________________________________  Should you have questions after your visit to [facility], please contact our office at [phone] between the hours of 8:30 a.m. and 4:30 p.m.  Voicemails left after 4:30 p.m. will not be returned until the following business day.  For prescription refill requests, have your pharmacy contact our office.       Resources For Cancer Patients and their Caregivers ? American Cancer Society: Can assist with transportation, wigs, general needs, runs Look Good Feel Better.        754-139-9457 ? Cancer Care: Provides financial assistance, online support groups, medication/co-pay assistance.  1-800-813-HOPE (747)749-6762) ? [facility] Assists [place] cancer patients and their  families through emotional , educational and financial support.  352 207 4639 ? [place] DSS Where to apply for food stamps, Medicaid and utility assistance. 863-745-8888 ? RCATS: Transportation to medical appointments. 3474048701 ? Social Security Administration: May apply for disability if have a Stage IV cancer. 813-368-7770 334 675 9721 ? [employer], Disability and Transit Services: Assists with nutrition, care and transit needs. [place] Support Programs: @10RELATIVEDAYS @ > Cancer Support Group  2nd Tuesday of the month 1pm-2pm, Journey Room  > Creative Journey  3rd Tuesday of the month 1130am-1pm, Journey Room  > Look Good Feel Better  1st Wednesday of the month 10am-12 noon, Journey Room (Call [place] to register [phone])

## 2017-01-13 NOTE — Progress Notes (Signed)
Treatment given per orders. Patient tolerated it well without problems. Vitals stable and discharged home from clinic ambulatory. Follow up as scheduled.  

## 2017-01-16 ENCOUNTER — Other Ambulatory Visit: Payer: Self-pay | Admitting: Family Medicine

## 2017-01-16 DIAGNOSIS — G4700 Insomnia, unspecified: Secondary | ICD-10-CM

## 2017-01-16 DIAGNOSIS — C9000 Multiple myeloma not having achieved remission: Secondary | ICD-10-CM

## 2017-01-16 NOTE — Telephone Encounter (Signed)
Ok to refill 

## 2017-01-19 NOTE — Telephone Encounter (Signed)
ok 

## 2017-02-03 DIAGNOSIS — Z79899 Other long term (current) drug therapy: Secondary | ICD-10-CM | POA: Diagnosis not present

## 2017-02-03 DIAGNOSIS — R0789 Other chest pain: Secondary | ICD-10-CM | POA: Diagnosis not present

## 2017-02-03 DIAGNOSIS — Z7982 Long term (current) use of aspirin: Secondary | ICD-10-CM | POA: Diagnosis not present

## 2017-02-03 DIAGNOSIS — G629 Polyneuropathy, unspecified: Secondary | ICD-10-CM | POA: Diagnosis not present

## 2017-02-03 DIAGNOSIS — Z833 Family history of diabetes mellitus: Secondary | ICD-10-CM | POA: Diagnosis not present

## 2017-02-03 DIAGNOSIS — K219 Gastro-esophageal reflux disease without esophagitis: Secondary | ICD-10-CM | POA: Diagnosis not present

## 2017-02-03 DIAGNOSIS — Z823 Family history of stroke: Secondary | ICD-10-CM | POA: Diagnosis not present

## 2017-02-03 DIAGNOSIS — Z510 Encounter for antineoplastic radiation therapy: Secondary | ICD-10-CM | POA: Diagnosis not present

## 2017-02-03 DIAGNOSIS — Z885 Allergy status to narcotic agent status: Secondary | ICD-10-CM | POA: Diagnosis not present

## 2017-02-03 DIAGNOSIS — Z9221 Personal history of antineoplastic chemotherapy: Secondary | ICD-10-CM | POA: Diagnosis not present

## 2017-02-03 DIAGNOSIS — R51 Headache: Secondary | ICD-10-CM | POA: Diagnosis not present

## 2017-02-03 DIAGNOSIS — Z8781 Personal history of (healed) traumatic fracture: Secondary | ICD-10-CM | POA: Diagnosis not present

## 2017-02-03 DIAGNOSIS — Z8249 Family history of ischemic heart disease and other diseases of the circulatory system: Secondary | ICD-10-CM | POA: Diagnosis not present

## 2017-02-03 DIAGNOSIS — R102 Pelvic and perineal pain: Secondary | ICD-10-CM | POA: Diagnosis not present

## 2017-02-03 DIAGNOSIS — C9000 Multiple myeloma not having achieved remission: Secondary | ICD-10-CM | POA: Diagnosis not present

## 2017-02-03 DIAGNOSIS — N400 Enlarged prostate without lower urinary tract symptoms: Secondary | ICD-10-CM | POA: Diagnosis not present

## 2017-02-03 DIAGNOSIS — Z888 Allergy status to other drugs, medicaments and biological substances status: Secondary | ICD-10-CM | POA: Diagnosis not present

## 2017-02-03 DIAGNOSIS — Z923 Personal history of irradiation: Secondary | ICD-10-CM | POA: Diagnosis not present

## 2017-02-04 ENCOUNTER — Other Ambulatory Visit: Payer: Self-pay

## 2017-02-04 ENCOUNTER — Ambulatory Visit (INDEPENDENT_AMBULATORY_CARE_PROVIDER_SITE_OTHER): Payer: Medicare Other | Admitting: Family Medicine

## 2017-02-04 ENCOUNTER — Encounter: Payer: Self-pay | Admitting: Family Medicine

## 2017-02-04 VITALS — BP 120/78 | HR 55 | Temp 98.7°F | Resp 16 | Ht 69.0 in | Wt 239.0 lb

## 2017-02-04 DIAGNOSIS — C9000 Multiple myeloma not having achieved remission: Secondary | ICD-10-CM

## 2017-02-04 DIAGNOSIS — R51 Headache: Secondary | ICD-10-CM | POA: Diagnosis not present

## 2017-02-04 DIAGNOSIS — G8929 Other chronic pain: Secondary | ICD-10-CM

## 2017-02-04 DIAGNOSIS — R519 Headache, unspecified: Secondary | ICD-10-CM

## 2017-02-04 DIAGNOSIS — M899 Disorder of bone, unspecified: Secondary | ICD-10-CM | POA: Diagnosis not present

## 2017-02-04 NOTE — Patient Instructions (Addendum)
MRI of brain to be done Take pain medicine as needed  F/U pending results

## 2017-02-04 NOTE — Progress Notes (Signed)
   Subjective:    Patient ID: Dean [name]., male    DOB: [birth year], 82 y.o.   MRN: [id number]  Patient presents for Headache (x months- states that he has "excruitiating" pain from HA that moves) and Knot to R Side of Head (x3 weeks- states that knot like area noted to R side of head- reports that it becomes tender with HA)   He has had a bad headache that startes on left occipital egion and moves across forehead to top of head. Intermittant episodes, no change in vision has known Pseudophakia and myopia   Then 1 week agpo noticed knot on right temple region, no injury, no fall, swelling has gone down a little   But he had a bad headache last night so came in   He has histry of skull lesion that he had radiation thought to be MM as welll He had PET Scan done in Oct which showed new hypermetabolic lesions in sternum/sacrum consistent with Multiple Myleoma , he had radiation prefromed Oct 29-11/7  For these lesions      Review Of Systems:  GEN- denies fatigue, fever, weight loss,weakness, recent illness HEENT- denies eye drainage, change in vision, nasal discharge, CVS- denies chest pain, palpitations RESP- denies SOB, cough, wheeze ABD- denies N/V, change in stools, abd pain GU- denies dysuria, hematuria, dribbling, incontinence MSK- denies joint pain, muscle aches, injury Neuro- + headache, dizziness, syncope, seizure activity       Objective:    BP 120/78   Pulse (!) 55   Temp 98.7 F (37.1 C) (Oral)   Resp 16   Ht '5\' 9"'$  (1.753 m)   Wt 239 lb (108.4 kg)   SpO2 98%   BMI 35.29 kg/m  GEN- NAD, alert and oriented x3 HEENT- PERRL, EOMI, non injected sclera, pink conjunctiva, MMM, oropharynx clear, Right temple, olive size knot palpated, NT, no fluctuance, no erythema  Neck- Supple, noLAD CVS- RRR, no murmur RESP-CTAB Neuro-CNII-XII in tact, no focal deficits  Pulses- Radial 2+        Assessment & Plan:      Problem List Items Addressed This Visit      Unprioritized   Skull lesion   Multiple myeloma without remission ([facility]) - Primary    Concern for his intermittent episodes of headache and now this swelling mass in the right temple is that 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  We will also need to rule out intracranial lesions as well.  MRI of the brain will be done with and without contrast.  His PET scan was from the skull base below As his pain is intermittent with the exception of the swelling on the side of his head and he will use Tylenol and his pain medicine as needed for now       Other Visit Diagnoses    Chronic nonintractable headache, unspecified headache type          Note: This dictation was prepared with Dragon dictation along with smaller phrase technology. Any transcriptional errors that result from this process are unintentional.

## 2017-02-04 NOTE — Assessment & Plan Note (Signed)
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  We will also need to rule out intracranial lesions as well.  MRI of the brain will be done with and without contrast.  His PET scan was from the skull base below As his pain is intermittent with the exception of the swelling on the side of his head and he will use Tylenol and his pain medicine as needed for now

## 2017-02-09 ENCOUNTER — Ambulatory Visit
Admission: RE | Admit: 2017-02-09 | Discharge: 2017-02-09 | Disposition: A | Discharge status: Home / Self Care | Payer: Medicare Other | Source: Ambulatory Visit | Attending: Family Medicine | Admitting: Family Medicine

## 2017-02-09 DIAGNOSIS — C9000 Multiple myeloma not having achieved remission: Secondary | ICD-10-CM | POA: Diagnosis not present

## 2017-02-09 DIAGNOSIS — M899 Disorder of bone, unspecified: Secondary | ICD-10-CM

## 2017-02-09 DIAGNOSIS — R51 Headache: Secondary | ICD-10-CM

## 2017-02-09 DIAGNOSIS — R519 Headache, unspecified: Secondary | ICD-10-CM

## 2017-02-09 MED ORDER — GADOBENATE DIMEGLUMINE 529 MG/ML IV SOLN
20.0000 mL | Freq: Once | INTRAVENOUS | Status: AC | PRN
  Administered 2017-02-09: 20 mL via INTRAVENOUS

## 2017-02-10 ENCOUNTER — Encounter (HOSPITAL_COMMUNITY): Payer: Self-pay | Admitting: Hematology and Oncology

## 2017-02-10 ENCOUNTER — Other Ambulatory Visit: Payer: Self-pay

## 2017-02-10 ENCOUNTER — Inpatient Hospital Stay (HOSPITAL_COMMUNITY): Payer: Medicare Other | Attending: Oncology

## 2017-02-10 ENCOUNTER — Ambulatory Visit (HOSPITAL_COMMUNITY): Payer: Medicare Other | Admitting: Hematology and Oncology

## 2017-02-10 ENCOUNTER — Inpatient Hospital Stay (HOSPITAL_BASED_OUTPATIENT_CLINIC_OR_DEPARTMENT_OTHER): Payer: Medicare Other | Admitting: Hematology and Oncology

## 2017-02-10 ENCOUNTER — Inpatient Hospital Stay (HOSPITAL_COMMUNITY): Payer: Medicare Other

## 2017-02-10 ENCOUNTER — Ambulatory Visit (HOSPITAL_COMMUNITY): Payer: Medicare Other

## 2017-02-10 VITALS — BP 158/68 | HR 97 | Temp 97.8°F | Resp 20 | Ht 69.0 in | Wt 236.0 lb

## 2017-02-10 DIAGNOSIS — Z5112 Encounter for antineoplastic immunotherapy: Secondary | ICD-10-CM | POA: Diagnosis not present

## 2017-02-10 DIAGNOSIS — C9000 Multiple myeloma not having achieved remission: Secondary | ICD-10-CM

## 2017-02-10 DIAGNOSIS — C9001 Multiple myeloma in remission: Secondary | ICD-10-CM | POA: Insufficient documentation

## 2017-02-10 DIAGNOSIS — D696 Thrombocytopenia, unspecified: Secondary | ICD-10-CM

## 2017-02-10 LAB — CBC WITH DIFFERENTIAL/PLATELET
Basophils Absolute: 0 10*3/uL (ref 0.0–0.1)
Basophils Relative: 0 %
Eosinophils Absolute: 0 10*3/uL (ref 0.0–0.7)
Eosinophils Relative: 0 %
HCT: 44.9 % (ref 39.0–52.0)
Hemoglobin: 14.7 g/dL (ref 13.0–17.0)
Lymphocytes Relative: 16 %
Lymphs Abs: 0.8 10*3/uL (ref 0.7–4.0)
MCH: 30 pg (ref 26.0–34.0)
MCHC: 32.7 g/dL (ref 30.0–36.0)
MCV: 91.6 fL (ref 78.0–100.0)
Monocytes Absolute: 0.6 10*3/uL (ref 0.1–1.0)
Monocytes Relative: 12 %
Neutro Abs: 3.5 10*3/uL (ref 1.7–7.7)
Neutrophils Relative %: 72 %
Platelets: 119 10*3/uL — ABNORMAL LOW (ref 150–400)
RBC: 4.9 MIL/uL (ref 4.22–5.81)
RDW: 13.8 % (ref 11.5–15.5)
WBC: 4.9 10*3/uL (ref 4.0–10.5)

## 2017-02-10 LAB — COMPREHENSIVE METABOLIC PANEL
ALT: 14 U/L — ABNORMAL LOW (ref 17–63)
AST: 20 U/L (ref 15–41)
Albumin: 3.8 g/dL (ref 3.5–5.0)
Alkaline Phosphatase: 22 U/L — ABNORMAL LOW (ref 38–126)
Anion gap: 11 (ref 5–15)
BUN: 21 mg/dL — ABNORMAL HIGH (ref 6–20)
CO2: 19 mmol/L — ABNORMAL LOW (ref 22–32)
Calcium: 8.5 mg/dL — ABNORMAL LOW (ref 8.9–10.3)
Chloride: 105 mmol/L (ref 101–111)
Creatinine, Ser: 0.78 mg/dL (ref 0.61–1.24)
GFR calc Af Amer: >60 mL/min (ref >60)
GFR calc non Af Amer: >60 mL/min (ref >60)
Glucose, Bld: 122 mg/dL — ABNORMAL HIGH (ref 65–99)
Potassium: 3.7 mmol/L (ref 3.5–5.1)
Sodium: 135 mmol/L (ref 135–145)
Total Bilirubin: 0.5 mg/dL (ref 0.3–1.2)
Total Protein: 6.4 g/dL — ABNORMAL LOW (ref 6.5–8.1)

## 2017-02-10 MED ORDER — SULFAMETHOXAZOLE-TRIMETHOPRIM 800-160 MG PO TABS: 1.0000 | ORAL_TABLET | Freq: Every day | ORAL | 3 refills | Status: AC

## 2017-02-10 MED ORDER — ONDANSETRON HCL 8 MG PO TABS: 8.0000 mg | ORAL_TABLET | Freq: Two times a day (BID) | ORAL | 1 refills | Status: DC | PRN

## 2017-02-10 MED ORDER — PROCHLORPERAZINE MALEATE 10 MG PO TABS: 10.0000 mg | ORAL_TABLET | Freq: Four times a day (QID) | ORAL | 1 refills | Status: DC | PRN

## 2017-02-10 MED ORDER — LIDOCAINE-PRILOCAINE 2.5-2.5 % EX CREA: TOPICAL_CREAM | CUTANEOUS | 3 refills | Status: DC

## 2017-02-10 MED ORDER — LORAZEPAM 0.5 MG PO TABS: 0.5000 mg | ORAL_TABLET | Freq: Four times a day (QID) | ORAL | 0 refills | Status: DC | PRN

## 2017-02-10 MED ORDER — DEXAMETHASONE 4 MG PO TABS: 10.0000 mg | ORAL_TABLET | ORAL | 0 refills | Status: DC

## 2017-02-10 MED ORDER — ACYCLOVIR 400 MG PO TABS: 400.0000 mg | ORAL_TABLET | Freq: Two times a day (BID) | ORAL | 3 refills | Status: AC

## 2017-02-10 NOTE — Progress Notes (Signed)
Will hold Zometa today per Dr. Lebron Conners d/t hypocalcemia.

## 2017-02-11 ENCOUNTER — Encounter: Payer: Self-pay | Admitting: Radiation Oncology

## 2017-02-11 NOTE — Progress Notes (Signed)
Histology and Location of Primary Cancer: recurrent multiple myeloma   Location(s) of Symptomatic Metastases: MRI from 02/09/17 showed "New lesion right temporalis muscle 14 mm. 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  Past/Anticipated chemotherapy by medical oncology, if any: Zometa   Pain on a scale of 0-10 is: 7/10 - in his left skull - there are 2 areas that are burning - started a week ago.    SAFETY ISSUES: Prior radiation?  02/21/14-03/08/14 right posterior chest wall area 30 gray 04/19/14-05/02/14 lumbar spine 25 gray 01/05/13-02/10/13 - 45 gray to left occipital condyle region 12/01/16-12/10/16 Parasternal nodule, chest- 24 Gy total delivered in 8 fractions and left sacro-iliac, pelvis- 24 Gy total delivered in 8 fractions    Pacemaker/ICD? no  Possible current pregnancy? no  Is the patient on methotrexate? no  Current Complaints / other details:  Patient is here with his wife.  BP 134/74 (BP Location: Left Arm, Patient Position: Sitting)   Pulse 75   Temp 97.6 F (36.4 C) (Oral)   Ht 5' 9"  (1.753 m)   Wt 237 lb 12.8 oz (107.9 kg)   SpO2 97%   BMI 35.12 kg/m    Wt Readings from Last 3 Encounters:  02/12/17 237 lb 12.8 oz (107.9 kg)  02/10/17 236 lb (107 kg)  02/04/17 239 lb (108.4 kg)

## 2017-02-12 ENCOUNTER — Encounter: Payer: Self-pay | Admitting: Radiation Oncology

## 2017-02-12 ENCOUNTER — Other Ambulatory Visit: Payer: Self-pay

## 2017-02-12 ENCOUNTER — Ambulatory Visit
Admission: RE | Admit: 2017-02-12 | Discharge: 2017-02-12 | Disposition: A | Discharge status: Home / Self Care | Payer: Medicare Other | Source: Ambulatory Visit | Attending: Radiation Oncology | Admitting: Radiation Oncology

## 2017-02-12 DIAGNOSIS — C9000 Multiple myeloma not having achieved remission: Secondary | ICD-10-CM | POA: Diagnosis not present

## 2017-02-12 DIAGNOSIS — Z923 Personal history of irradiation: Secondary | ICD-10-CM | POA: Diagnosis not present

## 2017-02-12 DIAGNOSIS — Z7982 Long term (current) use of aspirin: Secondary | ICD-10-CM | POA: Diagnosis not present

## 2017-02-12 DIAGNOSIS — R0789 Other chest pain: Secondary | ICD-10-CM | POA: Diagnosis not present

## 2017-02-12 DIAGNOSIS — M899 Disorder of bone, unspecified: Secondary | ICD-10-CM | POA: Diagnosis not present

## 2017-02-12 DIAGNOSIS — Z510 Encounter for antineoplastic radiation therapy: Secondary | ICD-10-CM | POA: Diagnosis not present

## 2017-02-12 DIAGNOSIS — R102 Pelvic and perineal pain: Secondary | ICD-10-CM | POA: Diagnosis not present

## 2017-02-12 DIAGNOSIS — Z79899 Other long term (current) drug therapy: Secondary | ICD-10-CM | POA: Diagnosis not present

## 2017-02-12 NOTE — Progress Notes (Signed)
  Radiation Oncology         [phone]) [phone] ________________________________  Name: Dean [name]. MRN: [id number]  Date: 02/12/2017  DOB: 1935-04-01  SIMULATION AND TREATMENT PLANNING NOTE    ICD-10-CM   1. Multiple myeloma without remission (HCC) C90.00     DIAGNOSIS:  Recurrent multiple myeloma  NARRATIVE:  The patient was brought to the [facility].  Identity was confirmed.  All relevant records and images related to the planned course of therapy were reviewed.  The patient freely provided informed written consent to proceed with treatment after reviewing the details related to the planned course of therapy. The consent form was witnessed and verified by the simulation staff.  Then, the patient was set-up in a stable reproducible  supine position for radiation therapy.  CT images were obtained.  Surface markings were placed.  The CT images were loaded into the planning software.  Then the target and avoidance structures were contoured.  Treatment planning then occurred.  The radiation prescription was entered and confirmed.  Then, I designed and supervised the construction of a total of 5 medically necessary complex treatment devices.  I have requested : 3D Simulation  I have requested a DVH of the following structures: GTV, PTV, retina and lens.  I have ordered: dose calc.  PLAN:  The patient will receive 30 Gy in 10 fractions.  -----------------------------------  Blair Promise, PhD, MD  This document serves as a record of services personally performed by Gery Pray, MD. It was created on his behalf by Linward Natal, a trained medical scribe. The creation of this record is based on the scribe's personal observations and the provider's statements to them. This document has been checked and approved by the attending provider.

## 2017-02-12 NOTE — Progress Notes (Signed)
Radiation Oncology         [phone]) [phone] ________________________________  Name: Dean [name]. MRN: [id number]  Date: 02/12/2017  DOB: 1935/06/15  Reevaluation Note  CC: [name], MD  Twana First, MD  No diagnosis found.  Diagnosis:   Recurrent Multiple Myeloma  Interval Since Last Radiation:  2 months   02/21/14-03/08/14 right posterior chest wall area 30 gray  04/19/14-05/02/14 lumbar spine 25 gray  01/05/13-02/10/13 - 45 gray to left occipital condyle region  12/01/16-11/07/18Parasternal nodule, chest- 24 Gy total delivered in66factions and left sacro-iliac, pelvis- 24 Gy total delivered in 8 fractions  Narrative:  The patient returns today for reevaluation of recurrent multiple myeloma treated with radiation as detailed above along with chemotherapy. The patient is due for restaging PET scan next week, but presented to his PCP on 02/05/16 with reports of intermittent "excrutiating" headache associated with knot at right side of head present for 3 weeks. He stated the knot was becoming increasingly tender along with the headache. Pursuant MRI on 02/09/17 showed new lesion in right temporalis muscle measuring 14 mm. This is consistent with myeloma arising from the temporal bone extending into the muscle.                        On review of systems, the patient reports in the last 2 weeks he's noted increasing fullness along the right head above the ear posterior to his right temple. He denies any erosion through the skin. He denies any trouble with hearing. He does have a headache in the temple region bilaterally that radiates into the forehead. He also reports that he's had some tingling and burning along the left scalp at the crown. He describes this as tingling and burning sensation and reports the ibuprofen and tylenol he's taken for his headaches is mildly working, but does not seem to improve his symptoms. He reports improvement in his pelvic and sternal pain since  completion of his last course of treatment. No other complaints are noted.   Past Medical History:  Past Medical History:  Diagnosis Date  . BPH (benign prostatic hyperplasia)   . Colonic diverticular abscess 01/08/2015  . Diverticulitis 142/35/36  complicated by abscess and required percutaneous drainage  . GERD (gastroesophageal reflux disease)   . H/O ETOH abuse   . History of chemotherapy last done jan 2017  . History of radiation therapy 01/05/13-02/10/13   45 gray to left occipital condyle region  . History of radiation therapy 12/01/16-12/10/16   Parasternal nodule, chest- 24 Gy total delivered in 8 fractions, Left sacro-iliac, pelvis- 24 Gy total delivered in 8 fractions   . [place]   [place]  . Neuropathy   . Radiation 02/21/14-03/08/14   right posterior chest wall area 30 gray  . Radiation 04/19/14-05/02/14   lumbar spine 25 gray  . Skull lesion    Left occipital condyle  . Wrist fracture, left    x 2    Past Surgical History: Past Surgical History:  Procedure Laterality Date  . COLONOSCOPY N/A 04/19/2015   Procedure: COLONOSCOPY;  Surgeon: SDanie Binder MD;  Location: [facility];  Service: Endoscopy;  Laterality: N/A;  1:30 PM  . CYST EXCISION  1959   tail bone    Social History:  Social History   Socioeconomic History  . Marital status: Married    Spouse name: Not on file  . Number of children: 3  .  Years of education: Not on file  . Highest education level: Not on file  Social Needs  . Financial resource strain: Not on file  . Food insecurity - worry: Not on file  . Food insecurity - inability: Not on file  . Transportation needs - medical: Not on file  . Transportation needs - non-medical: Not on file  Occupational History    Employer: RETIRED  Tobacco Use  . Smoking status: Never Smoker  . Smokeless tobacco: Never Used  Substance and Sexual Activity  . Alcohol use: No    Comment: " Not much no more"  .  Drug use: No  . Sexual activity: Not on file  Other Topics Concern  . Not on file  Social History Narrative  . Not on file    Family History: Family History  Problem Relation Age of Onset  . Diabetes Mother   . Stroke Mother   . Hypertension Father      ALLERGIES:  is allergic to codeine; morphine and related; and revlimid [lenalidomide].  Meds: Current Outpatient Medications  Medication Sig Dispense Refill  . acyclovir (ZOVIRAX) 400 MG tablet Take 1 tablet (400 mg total) by mouth 2 (two) times daily. 60 tablet 3  . ALPRAZolam (XANAX) 1 MG tablet TAKE 2 TABLETS BY MOUTH AT BEDTIME AS NEEDED FOR ANXIETY AND OR SLEEP 60 tablet 2  . aspirin EC 325 MG tablet Take 1 tablet (325 mg total) by mouth daily. 30 tablet 11  . CALCIUM-VITAMIN D PO Take by mouth. 1500/1000 PO QD    . dexamethasone (DECADRON) 4 MG tablet Take 2.5 tablets (10 mg total) by mouth 2 (two) times a week. Take on Tue/Fri every week, do not start until bortezomib is initiated 60 tablet 0  . diclofenac sodium (VOLTAREN) 1 % GEL Apply to affected areas four times a day as needed 1 Tube 3  . gabapentin (NEURONTIN) 400 MG capsule TAKE 5 CAPSULES BY MOUTH (2000 MG TOTAL)DAILY 150 capsule 3  . ibuprofen (ADVIL,MOTRIN) 800 MG tablet TAKE 1 TABLET BY MOUTH EVERY 8 HOURS AS NEEDED FOR MODERATE PAIN. 90 tablet 2  . lidocaine-prilocaine (EMLA) cream Apply to affected area once 30 g 3  . LORazepam (ATIVAN) 0.5 MG tablet Take 1 tablet (0.5 mg total) by mouth every 6 (six) hours as needed (Nausea or vomiting). 30 tablet 0  . omeprazole (PRILOSEC) 20 MG capsule Take 1 capsule (20 mg total) by mouth daily. (Patient taking differently: Take 20 mg by mouth daily. Prn) 30 capsule 5  . ondansetron (ZOFRAN) 8 MG tablet Take 1 tablet (8 mg total) by mouth 2 (two) times daily as needed (Nausea or vomiting). 30 tablet 1  . oxyCODONE-acetaminophen (PERCOCET) 10-325 MG tablet Take 1 tablet 2 (two) times daily as needed by mouth for pain. 45 tablet  0  . prochlorperazine (COMPAZINE) 10 MG tablet Take 1 tablet (10 mg total) by mouth every 6 (six) hours as needed (Nausea or vomiting). 30 tablet 1  . sulfamethoxazole-trimethoprim (BACTRIM DS,SEPTRA DS) 800-160 MG tablet Take 1 tablet by mouth daily. 30 tablet 3  . tamsulosin (FLOMAX) 0.4 MG CAPS capsule TAKE 1 CAPSULE BY MOUTH AT BEDTIME 30 capsule 3  . zolendronic acid (ZOMETA) 4 MG/5ML injection Inject 4 mg into the vein. QOMonth     No current facility-administered medications for this encounter.    Facility-Administered Medications Ordered in Other Encounters  Medication Dose Route Frequency Provider Last Rate Last Dose  . sodium chloride 0.9 % injection 10 mL  10 mL Intracatheter PRN [facility], PA-C   10 mL at 08/04/14 1103    Physical Findings: Wt Readings from Last 3 Encounters:  02/12/17 237 lb 12.8 oz (107.9 kg)  02/10/17 236 lb (107 kg)  02/04/17 239 lb (108.4 kg)   Temp Readings from Last 3 Encounters:  02/12/17 97.6 F (36.4 C) (Oral)  02/10/17 97.8 F (36.6 C) (Oral)  02/04/17 98.7 F (37.1 C) (Oral)   BP Readings from Last 3 Encounters:  02/12/17 134/74  02/10/17 (!) 158/68  02/04/17 120/78   Pulse Readings from Last 3 Encounters:  02/12/17 75  02/10/17 97  02/04/17 (!) 55   In general this is a well appearing caucasian male in no acute distress. He's alert and oriented x4 and appropriate throughout the examination. Cardiopulmonary assessment is negative for acute distress and he exhibits normal effort. The right temple reveals fullness above the right ear posterior to the temple that is about 2 cm in greatest dimension, but projects about 1 cm from the skull. He does have some erythematous lesions along the left scalp as well without erosion through the skin. No other focal lesions are noted in the scalp or head. The areas are seen below.    Lab Findings: Lab Results  Component Value Date   WBC 4.9 02/10/2017   HGB 14.7 02/10/2017   HCT 44.9  02/10/2017   MCV 91.6 02/10/2017   PLT 119 (L) 02/10/2017    Radiographic Findings: Mr Jeri Cos YI Contrast  Result Date: 02/09/2017 CLINICAL DATA:  Multiple myeloma. Skull lesion. Post radiation for lesion in the left occipital condyle EXAM: MRI HEAD WITHOUT AND WITH CONTRAST TECHNIQUE: Multiplanar, multiecho pulse sequences of the brain and surrounding structures were obtained without and with intravenous contrast. CONTRAST:  5m MULTIHANCE GADOBENATE DIMEGLUMINE 529 MG/ML IV SOLN COMPARISON:  MRI 05/25/2013, CT 11/29/2012 FINDINGS: Brain: Mild atrophy without hydrocephalus. Negative for acute infarct. Scattered small white matter hyperintensities bilaterally similar to the prior study. Chronic microhemorrhage in the right cerebellum has developed since the prior MRI. No enhancing mass lesion in the brain. Vascular: Normal arterial flow voids. Skull and upper cervical spine: Treated lesion in the left occipital condyles is stable without recurrence. 14 mm enhancing soft tissue mass in the right temporalis muscle and involves the skull and is consistent with myeloma. No other skull lesions identified at this time Sinuses/Orbits: Negative Other: None IMPRESSION: Stable treated lesion left occipital condyles New lesion right temporalis muscle 14 mm. This is most consistent with myeloma arising from the temporal bone extending into the muscle. Electronically Signed   By: CFranchot GalloM.D.   On: 02/09/2017 16:35    Impression/Plan: 1.  Multiple Myeloma. He has been followed by Dr. PLebron Conners and will undergo PET scan next week.Dr. KSondra Comediscusses the findings on MRI imaging and the role for palliative radiotherapy in the right temporal bone, as well with conformal treatment, and would anticipate a course of 10 fractions as well as consideration of electrons to the left upper scalp. We discussed the risks, benefits, short, and long term effects of radiotherapy, and the patient is interested in proceeding. Dr.  KSondra Comediscusses the delivery and logistics of radiotherapy he will simulate today. Written consent is obtained and placed in the chart, a copy was provided to the patient.  In a visit lasting 25 minutes, greater than 50% of the time was spent face to face discussing his case, and coordinating the patient's care.   The above documentation reflects  my direct findings during this shared patient visit.     Carola Rhine, PAC Please see the note from Shona Simpson, PA-C from today's visit for more details of today's encounter.  I have personally performed a face to face diagnostic evaluation on this patient and devised the following assessment and plan.   Gery Pray, MD    This document serves as a record of services personally performed by Gery Pray, MD. It was created on his behalf by Linward Natal, a trained medical scribe. The creation of this record is based on the scribe's personal observations and the provider's statements to them. This document has been checked and approved by the attending provider.

## 2017-02-16 DIAGNOSIS — Z7982 Long term (current) use of aspirin: Secondary | ICD-10-CM | POA: Diagnosis not present

## 2017-02-16 DIAGNOSIS — R0789 Other chest pain: Secondary | ICD-10-CM | POA: Diagnosis not present

## 2017-02-16 DIAGNOSIS — R102 Pelvic and perineal pain: Secondary | ICD-10-CM | POA: Diagnosis not present

## 2017-02-16 DIAGNOSIS — C9000 Multiple myeloma not having achieved remission: Secondary | ICD-10-CM | POA: Diagnosis not present

## 2017-02-16 DIAGNOSIS — Z79899 Other long term (current) drug therapy: Secondary | ICD-10-CM | POA: Diagnosis not present

## 2017-02-16 DIAGNOSIS — M899 Disorder of bone, unspecified: Secondary | ICD-10-CM | POA: Diagnosis not present

## 2017-02-16 DIAGNOSIS — Z510 Encounter for antineoplastic radiation therapy: Secondary | ICD-10-CM | POA: Diagnosis not present

## 2017-02-16 NOTE — Addendum Note (Signed)
Encounter addended by: Gery Pray, MD on: 02/16/2017 5:54 PM  Actions taken: Follow-up modified

## 2017-02-17 ENCOUNTER — Other Ambulatory Visit (HOSPITAL_COMMUNITY): Payer: Self-pay | Admitting: Adult Health

## 2017-02-17 ENCOUNTER — Ambulatory Visit
Admission: RE | Admit: 2017-02-17 | Discharge: 2017-02-17 | Disposition: A | Discharge status: Home / Self Care | Payer: Medicare Other | Source: Ambulatory Visit | Attending: Hematology and Oncology | Admitting: Hematology and Oncology

## 2017-02-17 ENCOUNTER — Other Ambulatory Visit (HOSPITAL_COMMUNITY): Payer: Self-pay | Admitting: *Deleted

## 2017-02-17 DIAGNOSIS — C9000 Multiple myeloma not having achieved remission: Secondary | ICD-10-CM | POA: Diagnosis not present

## 2017-02-17 LAB — GLUCOSE, CAPILLARY: Glucose-Capillary: 87 mg/dL (ref 65–99)

## 2017-02-17 MED ORDER — FLUDEOXYGLUCOSE F - 18 (FDG) INJECTION
12.0000 | Freq: Once | INTRAVENOUS | Status: AC | PRN
  Administered 2017-02-17: 12.95 via INTRAVENOUS

## 2017-02-18 ENCOUNTER — Inpatient Hospital Stay (HOSPITAL_COMMUNITY): Payer: Medicare Other

## 2017-02-18 ENCOUNTER — Inpatient Hospital Stay (HOSPITAL_BASED_OUTPATIENT_CLINIC_OR_DEPARTMENT_OTHER): Payer: Medicare Other | Admitting: Hematology and Oncology

## 2017-02-18 ENCOUNTER — Encounter (HOSPITAL_COMMUNITY): Payer: Self-pay

## 2017-02-18 VITALS — BP 154/69 | HR 92 | Temp 98.0°F | Resp 20 | Wt 239.2 lb

## 2017-02-18 DIAGNOSIS — G893 Neoplasm related pain (acute) (chronic): Secondary | ICD-10-CM | POA: Diagnosis not present

## 2017-02-18 DIAGNOSIS — Z5112 Encounter for antineoplastic immunotherapy: Secondary | ICD-10-CM | POA: Diagnosis not present

## 2017-02-18 DIAGNOSIS — Z5111 Encounter for antineoplastic chemotherapy: Secondary | ICD-10-CM

## 2017-02-18 DIAGNOSIS — C9000 Multiple myeloma not having achieved remission: Secondary | ICD-10-CM | POA: Diagnosis not present

## 2017-02-18 DIAGNOSIS — C9001 Multiple myeloma in remission: Secondary | ICD-10-CM | POA: Diagnosis not present

## 2017-02-18 LAB — COMPREHENSIVE METABOLIC PANEL
ALT: 14 U/L — ABNORMAL LOW (ref 17–63)
AST: 17 U/L (ref 15–41)
Albumin: 3.7 g/dL (ref 3.5–5.0)
Alkaline Phosphatase: 24 U/L — ABNORMAL LOW (ref 38–126)
Anion gap: 10 (ref 5–15)
BUN: 22 mg/dL — ABNORMAL HIGH (ref 6–20)
CO2: 23 mmol/L (ref 22–32)
Calcium: 9.2 mg/dL (ref 8.9–10.3)
Chloride: 107 mmol/L (ref 101–111)
Creatinine, Ser: 1.15 mg/dL (ref 0.61–1.24)
GFR calc Af Amer: >60 mL/min (ref >60)
GFR calc non Af Amer: 59 mL/min — ABNORMAL LOW (ref >60)
Glucose, Bld: 96 mg/dL (ref 65–99)
Potassium: 4.3 mmol/L (ref 3.5–5.1)
Sodium: 140 mmol/L (ref 135–145)
Total Bilirubin: 0.5 mg/dL (ref 0.3–1.2)
Total Protein: 6.5 g/dL (ref 6.5–8.1)

## 2017-02-18 LAB — CBC WITH DIFFERENTIAL/PLATELET
Basophils Absolute: 0 10*3/uL (ref 0.0–0.1)
Basophils Relative: 0 %
Eosinophils Absolute: 0 10*3/uL (ref 0.0–0.7)
Eosinophils Relative: 1 %
HCT: 43.2 % (ref 39.0–52.0)
Hemoglobin: 14.3 g/dL (ref 13.0–17.0)
Lymphocytes Relative: 23 %
Lymphs Abs: 1.3 10*3/uL (ref 0.7–4.0)
MCH: 30.4 pg (ref 26.0–34.0)
MCHC: 33.1 g/dL (ref 30.0–36.0)
MCV: 91.7 fL (ref 78.0–100.0)
Monocytes Absolute: 0.6 10*3/uL (ref 0.1–1.0)
Monocytes Relative: 11 %
Neutro Abs: 3.6 10*3/uL (ref 1.7–7.7)
Neutrophils Relative %: 65 %
Platelets: 121 10*3/uL — ABNORMAL LOW (ref 150–400)
RBC: 4.71 MIL/uL (ref 4.22–5.81)
RDW: 13.7 % (ref 11.5–15.5)
WBC: 5.5 10*3/uL (ref 4.0–10.5)

## 2017-02-18 LAB — MAGNESIUM: Magnesium: 2.2 mg/dL (ref 1.7–2.4)

## 2017-02-18 LAB — LACTATE DEHYDROGENASE: LDH: 114 U/L (ref 98–192)

## 2017-02-18 MED ORDER — PROCHLORPERAZINE MALEATE 10 MG PO TABS
ORAL_TABLET | ORAL | Status: AC
  Filled 2017-02-18: qty 1

## 2017-02-18 MED ORDER — BORTEZOMIB CHEMO SQ INJECTION 3.5 MG (2.5MG/ML)
1.3000 mg/m2 | Freq: Once | INTRAMUSCULAR | Status: AC
  Administered 2017-02-18: 3 mg via SUBCUTANEOUS
  Filled 2017-02-18: qty 3

## 2017-02-18 MED ORDER — PROCHLORPERAZINE MALEATE 10 MG PO TABS
10.0000 mg | ORAL_TABLET | Freq: Once | ORAL | Status: AC
  Administered 2017-02-18: 10 mg via ORAL

## 2017-02-18 NOTE — Progress Notes (Signed)
1500 Labs reviewed with and pt seen by Dr. Lebron Conners who approved for Velcade injection to be started today and Zometa infusion is now every 8 weeks per MD                   [name]. tolerated Velcade injection well without complaints or incident. VSS Pt discharged self ambulatory in satisfactory condition

## 2017-02-18 NOTE — Patient Instructions (Signed)
[facility] Discharge Instructions for Patients Receiving Chemotherapy   Beginning January 23rd 2017 lab work for the Cancer Center will be done in the  Main lab at [place] on 1st floor. If you have a lab appointment with the Cancer Center please come in thru the  Main Entrance and check in at the main information desk   Today you received the following chemotherapy agents Velcade injection. Follow-up as scheduled. Call clinic for any questions or concerns  To help prevent nausea and vomiting after your treatment, we encourage you to take your nausea medication   If you develop nausea and vomiting, or diarrhea that is not controlled by your medication, call the clinic.  The clinic phone number is [phone]. Office hours are Monday-Friday 8:30am-5:00pm.  BELOW ARE SYMPTOMS THAT SHOULD BE REPORTED IMMEDIATELY:  *FEVER GREATER THAN 101.0 F  *CHILLS WITH OR WITHOUT FEVER  NAUSEA AND VOMITING THAT IS NOT CONTROLLED WITH YOUR NAUSEA MEDICATION  *UNUSUAL SHORTNESS OF BREATH  *UNUSUAL BRUISING OR BLEEDING  TENDERNESS IN MOUTH AND THROAT WITH OR WITHOUT PRESENCE OF ULCERS  *URINARY PROBLEMS  *BOWEL PROBLEMS  UNUSUAL RASH Items with * indicate a potential emergency and should be followed up as soon as possible. If you have an emergency after office hours please contact your primary care physician or go to the nearest emergency department.  Please call the clinic during office hours if you have any questions or concerns.   You may also contact the Patient Navigator at [phone] should you have any questions or need assistance in obtaining follow up care.      Resources For Cancer Patients and their Caregivers ? American Cancer Society: Can assist with transportation, wigs, general needs, runs Look Good Feel Better.        1-888-227-6333 ? Cancer Care: Provides financial assistance, online support groups, medication/co-pay assistance.   1-800-813-HOPE (4673) ? [facility] Assists [place] cancer patients and their families through emotional , educational and financial support.  336-427-4357 ? [place] DSS Where to apply for food stamps, Medicaid and utility assistance. 336-342-1394 ? [facility]: Transportation to medical appointments. 336-347-2287 ? Social Security Administration: May apply for disability if have a Stage IV cancer. 336-342-7796 1-800-772-1213 ? [place] Aging, Disability and Transit Services: Assists with nutrition, care and transit needs. 336-349-2343         

## 2017-02-19 ENCOUNTER — Ambulatory Visit
Admission: RE | Admit: 2017-02-19 | Discharge: 2017-02-19 | Disposition: A | Discharge status: Home / Self Care | Payer: Medicare Other | Source: Ambulatory Visit | Attending: Radiation Oncology | Admitting: Radiation Oncology

## 2017-02-19 ENCOUNTER — Telehealth: Payer: Self-pay | Admitting: Oncology

## 2017-02-19 DIAGNOSIS — Z510 Encounter for antineoplastic radiation therapy: Secondary | ICD-10-CM | POA: Diagnosis not present

## 2017-02-19 DIAGNOSIS — M899 Disorder of bone, unspecified: Secondary | ICD-10-CM | POA: Diagnosis not present

## 2017-02-19 DIAGNOSIS — R102 Pelvic and perineal pain: Secondary | ICD-10-CM | POA: Diagnosis not present

## 2017-02-19 DIAGNOSIS — Z7982 Long term (current) use of aspirin: Secondary | ICD-10-CM | POA: Diagnosis not present

## 2017-02-19 DIAGNOSIS — C9000 Multiple myeloma not having achieved remission: Secondary | ICD-10-CM

## 2017-02-19 DIAGNOSIS — R0789 Other chest pain: Secondary | ICD-10-CM | POA: Diagnosis not present

## 2017-02-19 DIAGNOSIS — Z79899 Other long term (current) drug therapy: Secondary | ICD-10-CM | POA: Diagnosis not present

## 2017-02-19 LAB — BETA 2 MICROGLOBULIN, SERUM: Beta-2 Microglobulin: 1.7 mg/L (ref 0.6–2.4)

## 2017-02-19 NOTE — Progress Notes (Signed)
  Radiation Oncology         [phone]) [phone] ________________________________  Name: Dean [name]. MRN: [id number]  Date: 02/19/2017  DOB: [birth year]  Simulation Verification Note    ICD-10-CM   1. Multiple myeloma without remission ([place]) C90.00     Status: outpatient  NARRATIVE: The patient was brought to the treatment unit and placed in the planned treatment position. The clinical setup was verified. Then port films were obtained and uploaded to the radiation oncology medical record software.  The treatment beams were carefully compared against the planned radiation fields. The position location and shape of the radiation fields was reviewed. They targeted volume of tissue appears to be appropriately covered by the radiation beams. Organs at risk appear to be excluded as planned.  Based on my personal review, I approved the simulation verification. The patient's treatment will proceed as planned.  -----------------------------------  Blair Promise, PhD, MD

## 2017-02-19 NOTE — Telephone Encounter (Signed)
Called patient and advised of him of the results of his PET scan per Dr. Sondra Come.  He verbalized understanding and mentioned that the sore spots on the left side of his head have gone away.

## 2017-02-20 ENCOUNTER — Ambulatory Visit
Admission: RE | Admit: 2017-02-20 | Discharge: 2017-02-20 | Disposition: A | Discharge status: Home / Self Care | Payer: Medicare Other | Source: Ambulatory Visit | Attending: Radiation Oncology | Admitting: Radiation Oncology

## 2017-02-20 ENCOUNTER — Other Ambulatory Visit: Payer: Self-pay | Admitting: Hematology and Oncology

## 2017-02-20 DIAGNOSIS — M899 Disorder of bone, unspecified: Secondary | ICD-10-CM | POA: Diagnosis not present

## 2017-02-20 DIAGNOSIS — Z79899 Other long term (current) drug therapy: Secondary | ICD-10-CM | POA: Diagnosis not present

## 2017-02-20 DIAGNOSIS — Z7982 Long term (current) use of aspirin: Secondary | ICD-10-CM | POA: Diagnosis not present

## 2017-02-20 DIAGNOSIS — C9000 Multiple myeloma not having achieved remission: Secondary | ICD-10-CM | POA: Diagnosis not present

## 2017-02-20 DIAGNOSIS — R102 Pelvic and perineal pain: Secondary | ICD-10-CM | POA: Diagnosis not present

## 2017-02-20 DIAGNOSIS — R0789 Other chest pain: Secondary | ICD-10-CM | POA: Diagnosis not present

## 2017-02-20 DIAGNOSIS — Z510 Encounter for antineoplastic radiation therapy: Secondary | ICD-10-CM | POA: Diagnosis not present

## 2017-02-20 NOTE — Progress Notes (Signed)
[facility] Follow-up Visit:  Assessment: Multiple myeloma without remission ([employer]) 82 y.o. male with diagnosis of IgG kappa multiple myeloma with previous difficulty tolerating systemic therapy.  Most recent imaging demonstrates progressive disease in Dean calvarium with associated severe pain.  I am concerned that there is a more systemic disease progression which appears to be supported by Dean recent labs.  Plan: -- Patient will return to radiation oncology for possible additional palliative radiation to Dean calvarium --Repeat lab work as outlined below -- PET/CT to assess for metastatic disease progression systemically. --Return to clinic in 1 week to consider possible initiation of systemic therapy with bortezomib and low-dose dexamethasone.  Bortezomib will be administered at weekly intervals and dexamethasone will be further reduced to 10 mg twice weekly.   Voice recognition software was used and creation of this note. Despite my best effort at editing Dean text, some misspelling/errors may have occurred.  Orders Placed This Encounter  Procedures  . NM PET Image Restage (PS) Whole Body    Standing Status:   Future    Number of Occurrences:   1    Standing Expiration Date:   02/10/2018    Order Specific Question:   If indicated for Dean ordered procedure, I authorize Dean administration of a radiopharmaceutical per Radiology protocol    Answer:   Yes    Order Specific Question:   Preferred imaging location?    Answer:   [facility]    Order Specific Question:   Radiology Contrast Protocol - do NOT remove file path    Answer:   [email or web address]    Order Specific Question:   Reason for Exam additional comments    Answer:   Multiple myeloma, suspect progressive disease, please eval  . CBC with Differential    Standing Status:   Future    Number of Occurrences:   1    Standing Expiration Date:   02/10/2018  . Comprehensive  metabolic panel    Standing Status:   Future    Number of Occurrences:   1    Standing Expiration Date:   02/10/2018  . Magnesium    Standing Status:   Future    Number of Occurrences:   1    Standing Expiration Date:   02/10/2018  . Lactate dehydrogenase    Standing Status:   Future    Number of Occurrences:   1    Standing Expiration Date:   02/10/2018  . Beta 2 microglobuline, serum    Standing Status:   Future    Number of Occurrences:   1    Standing Expiration Date:   02/10/2018    Cancer Staging No matching staging information was found for Dean patient.  All questions were answered. . Dean patient knows to call Dean clinic with any problems, questions or concerns.  This note was electronically signed.    History of Presenting Illness Theon [name]. 82 y.o. presenting to [facility] for IgG kappa multiple myeloma monitoring.  Patient was recently treated with radiation therapy for palliation of skeletal pain with significant improvement pain in Dean areas that have received radiation, nevertheless, patient complains of progressive pain and several areas of Dean scalp/head which to correlate with findings of progressive disease in Dean calvarial skeletal structures on Dean recent MRI.  No other new symptoms.  Oncological/hematological History:   Multiple myeloma without remission ([place])   11/16/2012 Initial Diagnosis    L occipital condyle destructive lesion, not  biopsied secondary to location. XRT      12/01/2012 Imaging    L3 superior endplate compression FX, no lytic lesions to suggest myeloma      02/06/2014 Imaging    soft tissue mass along Dean right posterior fifth rib with some rib destruction      02/16/2014 Miscellaneous    Normal CBC, Normal CMP, kappa,lamda ratio of 2.62 (abnormal), UIEP with slightly restricted band, IgG kappa, SPEP/IEP with monoclonal protein at 0.79 g/dl, normal igG, suppressed IGM      02/20/2014 Bone Marrow Biopsy    33% kappa restricted  plasma cells Normal FISH, normal cytogenetics      02/21/2014 - 03/08/2014 Radiation Therapy    30Gy to R rib lesion, lesion not biopsied      03/16/2014 PET scan    Scattered hypermetabolic osseous lesions, including Dean calvarium, ribs, left scapula, sacrum, and left femoral shaft. An expansile left vertebral body lesion at L3 extends into Dean epidural space of Dean left lateral recess,       03/17/2014 - 04/07/2014 Chemotherapy    Velcade and Dexamethasone due to initial denial of Revlimid by insurance company.  Zometa monthly.      03/22/2014 Imaging    MR_ L-Spine- Enhancing lesions compatible with multiple myeloma at L2, L3, and S1-2.      04/07/2014 - 07/28/2014 Chemotherapy    RVD with Revlimid at 25 mg days 1-14.  Revlimid dose reduced to 25 mg every other day x 14 days with 7 day respite beginning on day 1 of cycle 2.      04/17/2014 Adverse Reaction    Revlimid-induced rash.  Treated with steroids.      07/28/2014 - 08/04/2014 Chemotherapy    Revlimid daily      08/04/2014 Adverse Reaction    Velcade-induced peripheral neuropathy      08/04/2014 Treatment Plan Change    D/C Velcade      08/11/2014 PET scan    Response to therapy. Improvement and resolution of foci of osseous hypermetabolism.      08/22/2014 - 08/28/2014 Chemotherapy    Revlimid 25 mg days 1-21 every 28 days      08/28/2014 Adverse Reaction    Revlimid-induced rash. Medication held.  Medrol dose Pak prescribed.      09/04/2014 - 01/08/2015 Chemotherapy    Revlimid 10 mg every other day, without dexamethasone      01/08/2015 - 01/15/2015 Hospital Admission    Colonic diverticular abscess with IR drain placement      03/02/2015 PET scan    Only bony uptake is low activity at site of deformity/callus at R second rib FX, high activity in sigmoid colon, advanced sigmoid divertidulosis. Abscess not resolved?      06/04/2015 Imaging    CT nonobstructive L renal calculus, diverticulosis of descending and sigmoid  colon without inflammation, moderate prostatic enlargement      07/12/2015 Surgery    Diverticulitis s/p robotic sigmoid colectomy with Dr. Johney Maine      08/23/2015 PET scan    Primarily similar hypermetabolic osseous foci within Dean R sided ribs. new L third rib focus of hypermetab and sclerosis is favored to be related to healing FX. no soft tissue myeloma id'd. Presumed postop hypermetab and edema about Dean R pelvic wall       03/06/2016 PET scan    1. Reduced activity in Dean prior lesion such as Dean right second and fifth rib lesions, activity nearly completely resolved and significantly less than  added mediastinal blood pool activity. No new lesions are identified. 2. Other imaging findings of potential clinical significance: Coronary, aortic arch, and branch vessel atherosclerotic vascular disease. Aortoiliac atherosclerotic vascular disease. Enlarged prostate gland. Colonic diverticula.       Medical History: Past Medical History:  Diagnosis Date  . BPH (benign prostatic hyperplasia)   . Colonic diverticular abscess 01/08/2015  . Diverticulitis 52/84/13   complicated by abscess and required percutaneous drainage  . GERD (gastroesophageal reflux disease)   . H/O ETOH abuse   . History of chemotherapy last done jan 2017  . History of radiation therapy 01/05/13-02/10/13   45 gray to left occipital condyle region  . History of radiation therapy 12/01/16-12/10/16   Parasternal nodule, chest- 24 Gy total delivered in 8 fractions, Left sacro-iliac, pelvis- 24 Gy total delivered in 8 fractions   . Intra-abdominal abscess ([place])   . Multiple myeloma ([place]) 2015  . Neuropathy   . Radiation 02/21/14-03/08/14   right posterior chest wall area 30 gray  . Radiation 04/19/14-05/02/14   lumbar spine 25 gray  . Skull lesion    Left occipital condyle  . Wrist fracture, left    x 2    Surgical History: Past Surgical History:  Procedure Laterality Date  . COLONOSCOPY N/A 04/19/2015   Procedure:  COLONOSCOPY;  Surgeon: Danie Binder, MD;  Location: [facility];  Service: Endoscopy;  Laterality: N/A;  1:30 PM  . CYST EXCISION  1959   tail bone    Family History: Family History  Problem Relation Age of Onset  . Diabetes Mother   . Stroke Mother   . Hypertension Father     Social History: Social History   Socioeconomic History  . Marital status: Married    Spouse name: Not on file  . Number of children: 3  . Years of education: Not on file  . Highest education level: Not on file  Social Needs  . Financial resource strain: Not on file  . Food insecurity - worry: Not on file  . Food insecurity - inability: Not on file  . Transportation needs - medical: Not on file  . Transportation needs - non-medical: Not on file  Occupational History    Employer: RETIRED  Tobacco Use  . Smoking status: Never Smoker  . Smokeless tobacco: Never Used  Substance and Sexual Activity  . Alcohol use: No    Comment: " Not much no more"  . Drug use: No  . Sexual activity: Not on file  Other Topics Concern  . Not on file  Social History Narrative  . Not on file    Allergies: Allergies  Allergen Reactions  . Codeine Other (See Comments)    Severe headache  . Morphine And Related Other (See Comments)    Makes him feel weird  . Revlimid [Lenalidomide] Other (See Comments)    "Causes me to become weak"    Medications:  Current Outpatient Medications  Medication Sig Dispense Refill  . ALPRAZolam (XANAX) 1 MG tablet TAKE 2 TABLETS BY MOUTH AT BEDTIME AS NEEDED FOR ANXIETY AND OR SLEEP 60 tablet 2  . aspirin EC 325 MG tablet Take 1 tablet (325 mg total) by mouth daily. 30 tablet 11  . CALCIUM-VITAMIN D PO Take by mouth. 1500/1000 PO QD    . diclofenac sodium (VOLTAREN) 1 % GEL Apply to affected areas four times a day as needed 1 Tube 3  . gabapentin (NEURONTIN) 400 MG capsule TAKE 5 CAPSULES BY MOUTH (2000  MG TOTAL)DAILY 150 capsule 3  . ibuprofen (ADVIL,MOTRIN) 800 MG tablet  TAKE 1 TABLET BY MOUTH EVERY 8 HOURS AS NEEDED FOR MODERATE PAIN. 90 tablet 2  . omeprazole (PRILOSEC) 20 MG capsule Take 1 capsule (20 mg total) by mouth daily. (Patient not taking: Reported on 02/12/2017) 30 capsule 5  . oxyCODONE-acetaminophen (PERCOCET) 10-325 MG tablet Take 1 tablet 2 (two) times daily as needed by mouth for pain. 45 tablet 0  . tamsulosin (FLOMAX) 0.4 MG CAPS capsule TAKE 1 CAPSULE BY MOUTH AT BEDTIME 30 capsule 3  . zolendronic acid (ZOMETA) 4 MG/5ML injection Inject 4 mg into Dean vein. QOMonth    . acyclovir (ZOVIRAX) 400 MG tablet Take 1 tablet (400 mg total) by mouth 2 (two) times daily. 60 tablet 3  . dexamethasone (DECADRON) 4 MG tablet Take 2.5 tablets (10 mg total) by mouth 2 (two) times a week. Take on Tue/Fri every week, do not start until bortezomib is initiated 60 tablet 0  . lidocaine-prilocaine (EMLA) cream Apply to affected area once 30 g 3  . LORazepam (ATIVAN) 0.5 MG tablet Take 1 tablet (0.5 mg total) by mouth every 6 (six) hours as needed (Nausea or vomiting). 30 tablet 0  . ondansetron (ZOFRAN) 8 MG tablet Take 1 tablet (8 mg total) by mouth 2 (two) times daily as needed (Nausea or vomiting). 30 tablet 1  . prochlorperazine (COMPAZINE) 10 MG tablet Take 1 tablet (10 mg total) by mouth every 6 (six) hours as needed (Nausea or vomiting). 30 tablet 1  . sulfamethoxazole-trimethoprim (BACTRIM DS,SEPTRA DS) 800-160 MG tablet Take 1 tablet by mouth daily. 30 tablet 3   No current facility-administered medications for this visit.    Facility-Administered Medications Ordered in Other Visits  Medication Dose Route Frequency Provider Last Rate Last Dose  . sodium chloride 0.9 % injection 10 mL  10 mL Intracatheter PRN [facility], PA-C   10 mL at 08/04/14 1103    Review of Systems: Review of Systems  Constitutional: Positive for appetite change and fatigue.  Neurological: Positive for dizziness and numbness.  Hematological: Bruises/bleeds easily.  All  other systems reviewed and are negative.    PHYSICAL EXAMINATION Blood pressure (!) 158/68, pulse 97, temperature 97.8 F (36.6 C), temperature source Oral, resp. rate 20, height _0  (1.753 m), weight 236 lb (107 kg), SpO2 98 %.  ECOG PERFORMANCE STATUS: 2 - Symptomatic, <50% confined to bed  Physical Exam  Constitutional: He is oriented to person, place, and time.  Pleasant elderly male who appears to be alert, awake, and oriented.  Appears to be somewhat uncomfortable.  HENT:  Head: Normocephalic.  Mouth/Throat: Oropharynx is clear and moist. No oropharyngeal exudate.  Several areas of irregular calvarial bulging with associated tenderness and pain.  No skin lesions.  Eyes: Conjunctivae and EOM are normal. Pupils are equal, round, and reactive to light. No scleral icterus.  Neck: No thyromegaly present.  Cardiovascular: Normal rate, regular rhythm and normal heart sounds.  No murmur heard. Pulmonary/Chest: Effort normal and breath sounds normal. No respiratory distress. He has no wheezes. He has no rales.  Abdominal: Soft. Bowel sounds are normal. He exhibits no distension. There is no tenderness. There is no rebound.  Musculoskeletal: He exhibits no edema.  Lymphadenopathy:    He has no cervical adenopathy.  Neurological: He is alert and oriented to person, place, and time. He has normal reflexes. No cranial nerve deficit.  Skin: Skin is warm and dry. No rash noted. No  erythema.     LABORATORY DATA: I have personally reviewed Dean data as listed: Appointment on 02/10/2017  Component Date Value Ref Range Status  . WBC 02/10/2017 4.9  4.0 - 10.5 K/uL Final  . RBC 02/10/2017 4.90  4.22 - 5.81 MIL/uL Final  . Hemoglobin 02/10/2017 14.7  13.0 - 17.0 g/dL Final  . HCT 02/10/2017 44.9  39.0 - 52.0 % Final  . MCV 02/10/2017 91.6  78.0 - 100.0 fL Final  . MCH 02/10/2017 30.0  26.0 - 34.0 pg Final  . MCHC 02/10/2017 32.7  30.0 - 36.0 g/dL Final  . RDW 02/10/2017 13.8  11.5 - 15.5 %  Final  . Platelets 02/10/2017 119* 150 - 400 K/uL Final   Comment: CONSISTENT WITH PREVIOUS RESULT SPECIMEN CHECKED FOR CLOTS   . Neutrophils Relative % 02/10/2017 72  % Final  . Neutro Abs 02/10/2017 3.5  1.7 - 7.7 K/uL Final  . Lymphocytes Relative 02/10/2017 16  % Final  . Lymphs Abs 02/10/2017 0.8  0.7 - 4.0 K/uL Final  . Monocytes Relative 02/10/2017 12  % Final  . Monocytes Absolute 02/10/2017 0.6  0.1 - 1.0 K/uL Final  . Eosinophils Relative 02/10/2017 0  % Final  . Eosinophils Absolute 02/10/2017 0.0  0.0 - 0.7 K/uL Final  . Basophils Relative 02/10/2017 0  % Final  . Basophils Absolute 02/10/2017 0.0  0.0 - 0.1 K/uL Final  . Sodium 02/10/2017 135  135 - 145 mmol/L Final  . Potassium 02/10/2017 3.7  3.5 - 5.1 mmol/L Final  . Chloride 02/10/2017 105  101 - 111 mmol/L Final  . CO2 02/10/2017 19* 22 - 32 mmol/L Final  . Glucose, Bld 02/10/2017 122* 65 - 99 mg/dL Final  . BUN 02/10/2017 21* 6 - 20 mg/dL Final  . Creatinine, Ser 02/10/2017 0.78  0.61 - 1.24 mg/dL Final  . Calcium 02/10/2017 8.5* 8.9 - 10.3 mg/dL Final  . Total Protein 02/10/2017 6.4* 6.5 - 8.1 g/dL Final  . Albumin 02/10/2017 3.8  3.5 - 5.0 g/dL Final  . AST 02/10/2017 20  15 - 41 U/L Final  . ALT 02/10/2017 14* 17 - 63 U/L Final  . Alkaline Phosphatase 02/10/2017 22* 38 - 126 U/L Final  . Total Bilirubin 02/10/2017 0.5  0.3 - 1.2 mg/dL Final  . GFR calc non Af Amer 02/10/2017 >60  >60 mL/min Final  . GFR calc Af Amer 02/10/2017 >60  >60 mL/min Final   Comment: (NOTE) Dean eGFR has been calculated using Dean CKD EPI equation. This calculation has not been validated in all clinical situations. eGFR's persistently <60 mL/min signify possible Chronic Kidney Disease.   Georgiann Hahn gap 02/10/2017 11  5 - 15 Final       Ardath Sax, MD

## 2017-02-20 NOTE — Assessment & Plan Note (Signed)
82 y.o. male with diagnosis of IgG kappa multiple myeloma with previous difficulty tolerating systemic therapy.  Most recent imaging demonstrates progressive disease in the calvarium with associated severe pain.  I am concerned that there is a more systemic disease progression which appears to be supported by the recent labs.  Plan: -- Patient will return to radiation oncology for possible additional palliative radiation to the calvarium --Repeat lab work as outlined below -- PET/CT to assess for metastatic disease progression systemically. --Return to clinic in 1 week to consider possible initiation of systemic therapy with bortezomib and low-dose dexamethasone.  Bortezomib will be administered at weekly intervals and dexamethasone will be further reduced to 10 mg twice weekly.

## 2017-02-23 ENCOUNTER — Other Ambulatory Visit: Payer: Self-pay | Admitting: Family Medicine

## 2017-02-23 ENCOUNTER — Ambulatory Visit
Admission: RE | Admit: 2017-02-23 | Discharge: 2017-02-23 | Disposition: A | Discharge status: Home / Self Care | Payer: Medicare Other | Source: Ambulatory Visit | Attending: Radiation Oncology | Admitting: Radiation Oncology

## 2017-02-23 ENCOUNTER — Other Ambulatory Visit (HOSPITAL_COMMUNITY): Payer: Self-pay | Admitting: Oncology

## 2017-02-23 DIAGNOSIS — Z510 Encounter for antineoplastic radiation therapy: Secondary | ICD-10-CM | POA: Insufficient documentation

## 2017-02-23 DIAGNOSIS — C9000 Multiple myeloma not having achieved remission: Secondary | ICD-10-CM | POA: Diagnosis not present

## 2017-02-23 DIAGNOSIS — N400 Enlarged prostate without lower urinary tract symptoms: Secondary | ICD-10-CM

## 2017-02-23 DIAGNOSIS — M899 Disorder of bone, unspecified: Secondary | ICD-10-CM | POA: Diagnosis not present

## 2017-02-24 ENCOUNTER — Ambulatory Visit
Admission: RE | Admit: 2017-02-24 | Discharge: 2017-02-24 | Disposition: A | Discharge status: Home / Self Care | Payer: Medicare Other | Source: Ambulatory Visit | Attending: Radiation Oncology | Admitting: Radiation Oncology

## 2017-02-24 DIAGNOSIS — Z510 Encounter for antineoplastic radiation therapy: Secondary | ICD-10-CM | POA: Diagnosis not present

## 2017-02-24 DIAGNOSIS — M899 Disorder of bone, unspecified: Secondary | ICD-10-CM | POA: Diagnosis not present

## 2017-02-24 DIAGNOSIS — C9000 Multiple myeloma not having achieved remission: Secondary | ICD-10-CM | POA: Diagnosis not present

## 2017-02-25 ENCOUNTER — Ambulatory Visit
Admission: RE | Admit: 2017-02-25 | Discharge: 2017-02-25 | Disposition: A | Discharge status: Home / Self Care | Payer: Medicare Other | Source: Ambulatory Visit | Attending: Radiation Oncology | Admitting: Radiation Oncology

## 2017-02-25 ENCOUNTER — Other Ambulatory Visit (HOSPITAL_COMMUNITY): Payer: Medicare Other

## 2017-02-25 ENCOUNTER — Ambulatory Visit (HOSPITAL_COMMUNITY): Payer: Medicare Other

## 2017-02-25 DIAGNOSIS — C9000 Multiple myeloma not having achieved remission: Secondary | ICD-10-CM | POA: Diagnosis not present

## 2017-02-25 DIAGNOSIS — Z510 Encounter for antineoplastic radiation therapy: Secondary | ICD-10-CM | POA: Diagnosis not present

## 2017-02-25 DIAGNOSIS — M899 Disorder of bone, unspecified: Secondary | ICD-10-CM | POA: Diagnosis not present

## 2017-02-26 ENCOUNTER — Inpatient Hospital Stay (HOSPITAL_COMMUNITY): Payer: Medicare Other | Admitting: Adult Health

## 2017-02-26 ENCOUNTER — Encounter (HOSPITAL_COMMUNITY): Payer: Self-pay

## 2017-02-26 ENCOUNTER — Ambulatory Visit
Admission: RE | Admit: 2017-02-26 | Discharge: 2017-02-26 | Disposition: A | Discharge status: Home / Self Care | Payer: Medicare Other | Source: Ambulatory Visit | Attending: Radiation Oncology | Admitting: Radiation Oncology

## 2017-02-26 ENCOUNTER — Other Ambulatory Visit: Payer: Self-pay

## 2017-02-26 ENCOUNTER — Inpatient Hospital Stay (HOSPITAL_COMMUNITY): Payer: Medicare Other

## 2017-02-26 VITALS — BP 137/68 | HR 60 | Temp 98.0°F | Resp 18 | Wt 240.0 lb

## 2017-02-26 DIAGNOSIS — Z5112 Encounter for antineoplastic immunotherapy: Secondary | ICD-10-CM | POA: Diagnosis not present

## 2017-02-26 DIAGNOSIS — M899 Disorder of bone, unspecified: Secondary | ICD-10-CM | POA: Diagnosis not present

## 2017-02-26 DIAGNOSIS — C9000 Multiple myeloma not having achieved remission: Secondary | ICD-10-CM

## 2017-02-26 DIAGNOSIS — Z510 Encounter for antineoplastic radiation therapy: Secondary | ICD-10-CM | POA: Diagnosis not present

## 2017-02-26 DIAGNOSIS — C9001 Multiple myeloma in remission: Secondary | ICD-10-CM | POA: Diagnosis not present

## 2017-02-26 LAB — CBC WITH DIFFERENTIAL/PLATELET
Basophils Absolute: 0 10*3/uL (ref 0.0–0.1)
Basophils Relative: 0 %
Eosinophils Absolute: 0 10*3/uL (ref 0.0–0.7)
Eosinophils Relative: 0 %
HCT: 42.4 % (ref 39.0–52.0)
Hemoglobin: 13.8 g/dL (ref 13.0–17.0)
Lymphocytes Relative: 24 %
Lymphs Abs: 1.1 10*3/uL (ref 0.7–4.0)
MCH: 30.5 pg (ref 26.0–34.0)
MCHC: 32.5 g/dL (ref 30.0–36.0)
MCV: 93.8 fL (ref 78.0–100.0)
Monocytes Absolute: 0.6 10*3/uL (ref 0.1–1.0)
Monocytes Relative: 14 %
Neutro Abs: 2.8 10*3/uL (ref 1.7–7.7)
Neutrophils Relative %: 62 %
Platelets: 123 10*3/uL — ABNORMAL LOW (ref 150–400)
RBC: 4.52 MIL/uL (ref 4.22–5.81)
RDW: 14 % (ref 11.5–15.5)
WBC: 4.5 10*3/uL (ref 4.0–10.5)

## 2017-02-26 LAB — COMPREHENSIVE METABOLIC PANEL
ALT: 15 U/L — ABNORMAL LOW (ref 17–63)
AST: 19 U/L (ref 15–41)
Albumin: 3.7 g/dL (ref 3.5–5.0)
Alkaline Phosphatase: 25 U/L — ABNORMAL LOW (ref 38–126)
Anion gap: 8 (ref 5–15)
BUN: 19 mg/dL (ref 6–20)
CO2: 25 mmol/L (ref 22–32)
Calcium: 9.2 mg/dL (ref 8.9–10.3)
Chloride: 103 mmol/L (ref 101–111)
Creatinine, Ser: 0.86 mg/dL (ref 0.61–1.24)
GFR calc Af Amer: >60 mL/min (ref >60)
GFR calc non Af Amer: >60 mL/min (ref >60)
Glucose, Bld: 111 mg/dL — ABNORMAL HIGH (ref 65–99)
Potassium: 4.7 mmol/L (ref 3.5–5.1)
Sodium: 136 mmol/L (ref 135–145)
Total Bilirubin: 0.4 mg/dL (ref 0.3–1.2)
Total Protein: 6.2 g/dL — ABNORMAL LOW (ref 6.5–8.1)

## 2017-02-26 LAB — MAGNESIUM: Magnesium: 2.1 mg/dL (ref 1.7–2.4)

## 2017-02-26 LAB — LACTATE DEHYDROGENASE: LDH: 138 U/L (ref 98–192)

## 2017-02-26 MED ORDER — PROCHLORPERAZINE MALEATE 10 MG PO TABS
ORAL_TABLET | ORAL | Status: AC
  Filled 2017-02-26: qty 1

## 2017-02-26 MED ORDER — BORTEZOMIB CHEMO SQ INJECTION 3.5 MG (2.5MG/ML)
1.3000 mg/m2 | Freq: Once | INTRAMUSCULAR | Status: AC
  Administered 2017-02-26: 3 mg via SUBCUTANEOUS
  Filled 2017-02-26: qty 3

## 2017-02-26 MED ORDER — PROCHLORPERAZINE MALEATE 10 MG PO TABS
10.0000 mg | ORAL_TABLET | Freq: Once | ORAL | Status: AC
  Administered 2017-02-26: 10 mg via ORAL

## 2017-02-26 NOTE — Progress Notes (Signed)
Dean [name]. presents today for injection per the provider's orders.  Velcade administration without incident; see MAR for injection details.  Patient tolerated procedure well and without incident.  No questions or complaints noted at this time.  Discharged ambulatory.

## 2017-02-27 ENCOUNTER — Ambulatory Visit
Admission: RE | Admit: 2017-02-27 | Discharge: 2017-02-27 | Disposition: A | Discharge status: Home / Self Care | Payer: Medicare Other | Source: Ambulatory Visit | Attending: Radiation Oncology | Admitting: Radiation Oncology

## 2017-02-27 DIAGNOSIS — M899 Disorder of bone, unspecified: Secondary | ICD-10-CM | POA: Diagnosis not present

## 2017-02-27 DIAGNOSIS — C9000 Multiple myeloma not having achieved remission: Secondary | ICD-10-CM | POA: Diagnosis not present

## 2017-02-27 DIAGNOSIS — Z510 Encounter for antineoplastic radiation therapy: Secondary | ICD-10-CM | POA: Diagnosis not present

## 2017-03-02 ENCOUNTER — Ambulatory Visit
Admission: RE | Admit: 2017-03-02 | Discharge: 2017-03-02 | Disposition: A | Discharge status: Home / Self Care | Payer: Medicare Other | Source: Ambulatory Visit | Attending: Radiation Oncology | Admitting: Radiation Oncology

## 2017-03-02 DIAGNOSIS — Z510 Encounter for antineoplastic radiation therapy: Secondary | ICD-10-CM | POA: Diagnosis not present

## 2017-03-02 DIAGNOSIS — C9000 Multiple myeloma not having achieved remission: Secondary | ICD-10-CM | POA: Diagnosis not present

## 2017-03-02 DIAGNOSIS — M899 Disorder of bone, unspecified: Secondary | ICD-10-CM | POA: Diagnosis not present

## 2017-03-03 ENCOUNTER — Ambulatory Visit
Admission: RE | Admit: 2017-03-03 | Discharge: 2017-03-03 | Disposition: A | Discharge status: Home / Self Care | Payer: Medicare Other | Source: Ambulatory Visit | Attending: Radiation Oncology | Admitting: Radiation Oncology

## 2017-03-03 ENCOUNTER — Other Ambulatory Visit (HOSPITAL_COMMUNITY): Payer: Self-pay | Admitting: *Deleted

## 2017-03-03 DIAGNOSIS — C9000 Multiple myeloma not having achieved remission: Secondary | ICD-10-CM | POA: Diagnosis not present

## 2017-03-03 DIAGNOSIS — M899 Disorder of bone, unspecified: Secondary | ICD-10-CM | POA: Diagnosis not present

## 2017-03-03 DIAGNOSIS — Z510 Encounter for antineoplastic radiation therapy: Secondary | ICD-10-CM | POA: Diagnosis not present

## 2017-03-04 ENCOUNTER — Encounter (HOSPITAL_COMMUNITY): Payer: Self-pay | Admitting: Hematology and Oncology

## 2017-03-04 ENCOUNTER — Inpatient Hospital Stay (HOSPITAL_COMMUNITY): Payer: Medicare Other

## 2017-03-04 ENCOUNTER — Other Ambulatory Visit: Payer: Self-pay

## 2017-03-04 ENCOUNTER — Ambulatory Visit
Admission: RE | Admit: 2017-03-04 | Discharge: 2017-03-04 | Disposition: A | Discharge status: Home / Self Care | Payer: Medicare Other | Source: Ambulatory Visit | Attending: Radiation Oncology | Admitting: Radiation Oncology

## 2017-03-04 ENCOUNTER — Encounter (HOSPITAL_COMMUNITY): Payer: Self-pay

## 2017-03-04 ENCOUNTER — Inpatient Hospital Stay (HOSPITAL_BASED_OUTPATIENT_CLINIC_OR_DEPARTMENT_OTHER): Payer: Medicare Other | Admitting: Internal Medicine

## 2017-03-04 VITALS — BP 164/64 | HR 83 | Temp 98.0°F | Resp 16 | Wt 250.0 lb

## 2017-03-04 DIAGNOSIS — Z510 Encounter for antineoplastic radiation therapy: Secondary | ICD-10-CM | POA: Diagnosis not present

## 2017-03-04 DIAGNOSIS — C9001 Multiple myeloma in remission: Secondary | ICD-10-CM | POA: Diagnosis not present

## 2017-03-04 DIAGNOSIS — C9002 Multiple myeloma in relapse: Secondary | ICD-10-CM | POA: Diagnosis not present

## 2017-03-04 DIAGNOSIS — C9000 Multiple myeloma not having achieved remission: Secondary | ICD-10-CM

## 2017-03-04 DIAGNOSIS — M899 Disorder of bone, unspecified: Secondary | ICD-10-CM | POA: Diagnosis not present

## 2017-03-04 DIAGNOSIS — D696 Thrombocytopenia, unspecified: Secondary | ICD-10-CM | POA: Diagnosis not present

## 2017-03-04 DIAGNOSIS — Z5112 Encounter for antineoplastic immunotherapy: Secondary | ICD-10-CM | POA: Diagnosis not present

## 2017-03-04 DIAGNOSIS — M2550 Pain in unspecified joint: Secondary | ICD-10-CM | POA: Diagnosis not present

## 2017-03-04 LAB — COMPREHENSIVE METABOLIC PANEL
ALT: 19 U/L (ref 17–63)
AST: 22 U/L (ref 15–41)
Albumin: 3.6 g/dL (ref 3.5–5.0)
Alkaline Phosphatase: 24 U/L — ABNORMAL LOW (ref 38–126)
Anion gap: 10 (ref 5–15)
BUN: 22 mg/dL — ABNORMAL HIGH (ref 6–20)
CO2: 26 mmol/L (ref 22–32)
Calcium: 9.1 mg/dL (ref 8.9–10.3)
Chloride: 104 mmol/L (ref 101–111)
Creatinine, Ser: 1 mg/dL (ref 0.61–1.24)
GFR calc Af Amer: >60 mL/min (ref >60)
GFR calc non Af Amer: >60 mL/min (ref >60)
Glucose, Bld: 95 mg/dL (ref 65–99)
Potassium: 4.2 mmol/L (ref 3.5–5.1)
Sodium: 140 mmol/L (ref 135–145)
Total Bilirubin: 0.4 mg/dL (ref 0.3–1.2)
Total Protein: 6.3 g/dL — ABNORMAL LOW (ref 6.5–8.1)

## 2017-03-04 LAB — CBC WITH DIFFERENTIAL/PLATELET
Basophils Absolute: 0 10*3/uL (ref 0.0–0.1)
Basophils Relative: 0 %
Eosinophils Absolute: 0 10*3/uL (ref 0.0–0.7)
Eosinophils Relative: 0 %
HCT: 41.6 % (ref 39.0–52.0)
Hemoglobin: 13.4 g/dL (ref 13.0–17.0)
Lymphocytes Relative: 22 %
Lymphs Abs: 1.1 10*3/uL (ref 0.7–4.0)
MCH: 30.5 pg (ref 26.0–34.0)
MCHC: 32.2 g/dL (ref 30.0–36.0)
MCV: 94.8 fL (ref 78.0–100.0)
Monocytes Absolute: 0.5 10*3/uL (ref 0.1–1.0)
Monocytes Relative: 10 %
Neutro Abs: 3.3 10*3/uL (ref 1.7–7.7)
Neutrophils Relative %: 68 %
Platelets: 98 10*3/uL — ABNORMAL LOW (ref 150–400)
RBC: 4.39 MIL/uL (ref 4.22–5.81)
RDW: 14.1 % (ref 11.5–15.5)
WBC: 4.9 10*3/uL (ref 4.0–10.5)

## 2017-03-04 MED ORDER — PROCHLORPERAZINE MALEATE 10 MG PO TABS
10.0000 mg | ORAL_TABLET | Freq: Once | ORAL | Status: AC
  Administered 2017-03-04: 10 mg via ORAL

## 2017-03-04 MED ORDER — BORTEZOMIB CHEMO SQ INJECTION 3.5 MG (2.5MG/ML)
1.3000 mg/m2 | Freq: Once | INTRAMUSCULAR | Status: AC
  Administered 2017-03-04: 3 mg via SUBCUTANEOUS
  Filled 2017-03-04: qty 3

## 2017-03-04 MED ORDER — PROCHLORPERAZINE MALEATE 10 MG PO TABS
ORAL_TABLET | ORAL | Status: AC
  Filled 2017-03-04: qty 1

## 2017-03-04 NOTE — Patient Instructions (Addendum)
[place] at [facility] Discharge Instructions  RECOMMENDATIONS MADE BY THE CONSULTANT AND ANY TEST RESULTS WILL BE SENT TO YOUR REFERRING PHYSICIAN.  You will get Velcade today.  Your labs were good other than your platelets. Your platelet count is 98,000. It could be low due to the radiation. Lab and PET scan report given to you today.   You should be taking your Dexamethasone as prescribed by Dr. Lebron Conners.   Labs next week with Velcade today.   1 month OV with MD  Zometa every other month    Thank you for choosing [place] at [facility] to provide your oncology and hematology care.  To afford each patient quality time with our provider, please arrive at least 15 minutes before your scheduled appointment time.    If you have a lab appointment with the [facility] please come in thru the  Main Entrance and check in at the main information desk  You need to re-schedule your appointment should you arrive 10 or more minutes late.  We strive to give you quality time with our providers, and arriving late affects you and other patients whose appointments are after yours.  Also, if you no show three or more times for appointments you may be dismissed from the clinic at the providers discretion.     Again, thank you for choosing [facility].  Our hope is that these requests will decrease the amount of time that you wait before being seen by our physicians.       _____________________________________________________________  Should you have questions after your visit to [facility], please contact our office at [phone] between the hours of 8:30 a.m. and 4:30 p.m.  Voicemails left after 4:30 p.m. will not be returned until the following business day.  For prescription refill requests, have your pharmacy contact our office.       Resources For Cancer Patients and their Caregivers ? American Cancer  Society: Can assist with transportation, wigs, general needs, runs Look Good Feel Better.        631 400 6190 ? Cancer Care: Provides financial assistance, online support groups, medication/co-pay assistance.  1-800-813-HOPE 651-753-9580) ? [facility] Assists [place] cancer patients and their families through emotional , educational and financial support.  (240)824-3845 ? [place] DSS Where to apply for food stamps, Medicaid and utility assistance. 442-042-4294 ? [facility]: Transportation to medical appointments. 807-242-3355 ? Social Security Administration: May apply for disability if have a Stage IV cancer. 845 238 2460 (630)415-9006 ? [employer], Disability and Transit Services: Assists with nutrition, care and transit needs. [place] Support Programs: @10RELATIVEDAYS @ > Cancer Support Group  2nd Tuesday of the month 1pm-2pm, Journey Room  > Creative Journey  3rd Tuesday of the month 1130am-1pm, Journey Room  > Look Good Feel Better  1st Wednesday of the month 10am-12 noon, Journey Room (Call [place] to register [phone])

## 2017-03-04 NOTE — Assessment & Plan Note (Signed)
82 y.o. male with diagnosis of IgG kappa multiple myeloma with previous difficulty tolerating systemic therapy.  Most recent imaging demonstrates progressive disease in the calvarium with associated severe pain.  PET/CT was unrevealing, but I believe that continuation of systemic therapy is indicated at this time.  PET scan is likely not showing myeloma as we do have clearly visible lesions in the scalp/calvarium at the very painful to the patient.  Plan: -Proceed with radiation therapy for palliation of the calvarium pain -Proceed with palliative systemic therapy with bortezomib and dexamethasone as outlined in oncological history. -Resume zoledronic acid. - Full myeloma panel 1 week prior to return to the clinic -Return to clinic in 4 weeks for possible second cycle of systemic therapy.

## 2017-03-04 NOTE — Progress Notes (Signed)
MD assessed patient and reviewed labs. Will proceed with velcade today  Treatment given per orders. Patient tolerated it well without problems. Vitals stable and discharged home from clinic ambulatory. Follow up as scheduled.

## 2017-03-04 NOTE — Progress Notes (Signed)
[facility] Follow-up Visit:  Assessment: Multiple myeloma without remission ([place]) 82 y.o. male with diagnosis of IgG kappa multiple myeloma with previous difficulty tolerating systemic therapy.  Most recent imaging demonstrates progressive disease in the calvarium with associated severe pain.  PET/CT was unrevealing, but I believe that continuation of systemic therapy is indicated at this time.  PET scan is likely not showing myeloma as we do have clearly visible lesions in the scalp/calvarium at the very painful to the patient.  Plan: -Proceed with radiation therapy for palliation of the calvarium pain -Proceed with palliative systemic therapy with bortezomib and dexamethasone as outlined in oncological history. -Resume zoledronic acid. - Full myeloma panel 1 week prior to return to the clinic -Return to clinic in 4 weeks for possible second cycle of systemic therapy.    Voice recognition software was used and creation of this note. Despite my best effort at editing the text, some misspelling/errors may have occurred.  Orders Placed This Encounter  Procedures  . CBC with Differential    Standing Status:   Future    Number of Occurrences:   1    Standing Expiration Date:   02/18/2018  . Comprehensive metabolic panel    Standing Status:   Future    Number of Occurrences:   1    Standing Expiration Date:   02/18/2018  . Multiple Myeloma Panel (SPEP&IFE w/QIG)    Standing Status:   Future    Number of Occurrences:   1    Standing Expiration Date:   02/18/2018  . Kappa/lambda light chains    Standing Status:   Future    Number of Occurrences:   1    Standing Expiration Date:   02/18/2018  . Beta 2 microglobuline, serum    Standing Status:   Future    Number of Occurrences:   1    Standing Expiration Date:   02/18/2018    Cancer Staging No matching staging information was found for the patient.  All questions were answered. . The patient knows to call the  clinic with any problems, questions or concerns.  This note was electronically signed.    History of Presenting Illness Linkon Siverson. 82 y.o. presenting to the [facility] for IgG kappa multiple myeloma monitoring.  Patient was recently treated with radiation therapy for palliation of skeletal pain with significant improvement pain in the areas that have received radiation, nevertheless, patient complains of progressive pain and several areas of the scalp/head which to correlate with findings of progressive disease in the calvarial skeletal structures on the recent MRI.  No other new symptoms.  Patient returns to the clinic for possible initiation of systemic therapy with bortezomib and dexamethasone small continuing zoledronic acid.  Patient is also planned for additional palliative radiation therapy by Dr. Sondra Come.  Oncological/hematological History:   Multiple myeloma without remission ([place])   11/16/2012 Initial Diagnosis    L occipital condyle destructive lesion, not biopsied secondary to location. XRT      12/01/2012 Imaging    L3 superior endplate compression FX, no lytic lesions to suggest myeloma      02/06/2014 Imaging    soft tissue mass along the right posterior fifth rib with some rib destruction      02/16/2014 Miscellaneous    Normal CBC, Normal CMP, kappa,lamda ratio of 2.62 (abnormal), UIEP with slightly restricted band, IgG kappa, SPEP/IEP with monoclonal protein at 0.79 g/dl, normal igG, suppressed IGM  02/20/2014 Bone Marrow Biopsy    33% kappa restricted plasma cells Normal FISH, normal cytogenetics      02/21/2014 - 03/08/2014 Radiation Therapy    30Gy to R rib lesion, lesion not biopsied      03/16/2014 PET scan    Scattered hypermetabolic osseous lesions, including the calvarium, ribs, left scapula, sacrum, and left femoral shaft. An expansile left vertebral body lesion at L3 extends into the epidural space of the left lateral recess,       03/17/2014  - 04/07/2014 Chemotherapy    Velcade and Dexamethasone due to initial denial of Revlimid by insurance company.  Zometa monthly.      03/22/2014 Imaging    MR_ L-Spine- Enhancing lesions compatible with multiple myeloma at L2, L3, and S1-2.      04/07/2014 - 07/28/2014 Chemotherapy    RVD with Revlimid at 25 mg days 1-14.  Revlimid dose reduced to 25 mg every other day x 14 days with 7 day respite beginning on day 1 of cycle 2.      04/17/2014 Adverse Reaction    Revlimid-induced rash.  Treated with steroids.      07/28/2014 - 08/04/2014 Chemotherapy    Revlimid daily      08/04/2014 Adverse Reaction    Velcade-induced peripheral neuropathy      08/04/2014 Treatment Plan Change    D/C Velcade      08/11/2014 PET scan    Response to therapy. Improvement and resolution of foci of osseous hypermetabolism.      08/22/2014 - 08/28/2014 Chemotherapy    Revlimid 25 mg days 1-21 every 28 days      08/28/2014 Adverse Reaction    Revlimid-induced rash. Medication held.  Medrol dose Pak prescribed.      09/04/2014 - 01/08/2015 Chemotherapy    Revlimid 10 mg every other day, without dexamethasone      01/08/2015 - 01/15/2015 Hospital Admission    Colonic diverticular abscess with IR drain placement      03/02/2015 PET scan    Only bony uptake is low activity at site of deformity/callus at R second rib FX, high activity in sigmoid colon, advanced sigmoid divertidulosis. Abscess not resolved?      06/04/2015 Imaging    CT nonobstructive L renal calculus, diverticulosis of descending and sigmoid colon without inflammation, moderate prostatic enlargement      07/12/2015 Surgery    Diverticulitis s/p robotic sigmoid colectomy with Dr. Johney Maine      08/23/2015 PET scan    Primarily similar hypermetabolic osseous foci within the R sided ribs. new L third rib focus of hypermetab and sclerosis is favored to be related to healing FX. no soft tissue myeloma id'd. Presumed postop hypermetab and edema about the R  pelvic wall       03/06/2016 PET scan    1. Reduced activity in the prior lesion such as the right second and fifth rib lesions, activity nearly completely resolved and significantly less than added mediastinal blood pool activity. No new lesions are identified. 2. Other imaging findings of potential clinical significance: Coronary, aortic arch, and branch vessel atherosclerotic vascular disease. Aortoiliac atherosclerotic vascular disease. Enlarged prostate gland. Colonic diverticula.      02/17/2017 PET scan    HEAD/NECK: No hypermetabolic activity in the scalp. No hypermetabolic cervical lymph nodes. CHEST: No hypermetabolic mediastinal or hilar nodes. Right upper lobe scarring/atelectasis. No suspicious pulmonary nodules on the CT scan. ABDOMEN/PELVIS: No abnormal hypermetabolic activity within the liver, pancreas, adrenal glands, or spleen.  No hypermetabolic lymph nodes in the abdomen or pelvis. Atherosclerotic calcifications of the abdominal aorta and branch vessels. Left renal sinus cysts. Sigmoid diverticulosis, without evidence of diverticulitis. Prostatomegaly. SKELETON: Vague hypermetabolism involving the inferior sternum, max SUV 3.2, previously 8.2. Focal hypermetabolism involving the left sacrum, max SUV 4.0, previously 6.7. EXTREMITIES: No abnormal hypermetabolic activity in the lower extremities.      02/18/2017 -  Chemotherapy    Bortezomib 1.'3mg'$ /m2 QWk + Dexamethasone '10mg'$  QTue/Fri --Cycle #1, 02/18/17        Medical History: Past Medical History:  Diagnosis Date  . BPH (benign prostatic hyperplasia)   . Colonic diverticular abscess 01/08/2015  . Diverticulitis 13/08/65   complicated by abscess and required percutaneous drainage  . GERD (gastroesophageal reflux disease)   . H/O ETOH abuse   . History of chemotherapy last done jan 2017  . History of radiation therapy 01/05/13-02/10/13   45 gray to left occipital condyle region  . History of radiation therapy  12/01/16-12/10/16   Parasternal nodule, chest- 24 Gy total delivered in 8 fractions, Left sacro-iliac, pelvis- 24 Gy total delivered in 8 fractions   . Intra-abdominal abscess ([place])   . Multiple myeloma ([place]) 2015  . Neuropathy   . Radiation 02/21/14-03/08/14   right posterior chest wall area 30 gray  . Radiation 04/19/14-05/02/14   lumbar spine 25 gray  . Skull lesion    Left occipital condyle  . Wrist fracture, left    x 2    Surgical History: Past Surgical History:  Procedure Laterality Date  . COLONOSCOPY N/A 04/19/2015   Procedure: COLONOSCOPY;  Surgeon: Danie Binder, MD;  Location: [facility];  Service: Endoscopy;  Laterality: N/A;  1:30 PM  . CYST EXCISION  1959   tail bone    Family History: Family History  Problem Relation Age of Onset  . Diabetes Mother   . Stroke Mother   . Hypertension Father     Social History: Social History   Socioeconomic History  . Marital status: Married    Spouse name: Not on file  . Number of children: 3  . Years of education: Not on file  . Highest education level: Not on file  Social Needs  . Financial resource strain: Not on file  . Food insecurity - worry: Not on file  . Food insecurity - inability: Not on file  . Transportation needs - medical: Not on file  . Transportation needs - non-medical: Not on file  Occupational History    Employer: RETIRED  Tobacco Use  . Smoking status: Never Smoker  . Smokeless tobacco: Never Used  Substance and Sexual Activity  . Alcohol use: No    Comment: " Not much no more"  . Drug use: No  . Sexual activity: Not on file  Other Topics Concern  . Not on file  Social History Narrative  . Not on file    Allergies: Allergies  Allergen Reactions  . Codeine Other (See Comments)    Severe headache  . Morphine And Related Other (See Comments)    Makes him feel weird  . Revlimid [Lenalidomide] Other (See Comments)    "Causes me to become weak"    Medications:  Current Outpatient  Medications  Medication Sig Dispense Refill  . acyclovir (ZOVIRAX) 400 MG tablet Take 1 tablet (400 mg total) by mouth 2 (two) times daily. 60 tablet 3  . ALPRAZolam (XANAX) 1 MG tablet TAKE 2 TABLETS BY MOUTH AT BEDTIME AS NEEDED FOR ANXIETY AND  OR SLEEP 60 tablet 2  . aspirin EC 325 MG tablet Take 1 tablet (325 mg total) by mouth daily. 30 tablet 11  . CALCIUM-VITAMIN D PO Take by mouth. 1500/1000 PO QD    . dexamethasone (DECADRON) 4 MG tablet Take 2.5 tablets (10 mg total) by mouth 2 (two) times a week. Take on Tue/Fri every week, do not start until bortezomib is initiated 60 tablet 0  . diclofenac sodium (VOLTAREN) 1 % GEL APPLY TO AFFECTED AREA 4 TIMES A DAY AS NEEDED 100 g 11  . gabapentin (NEURONTIN) 400 MG capsule TAKE 5 CAPSULES BY MOUTH (2000 MG TOTAL)DAILY 150 capsule 3  . ibuprofen (ADVIL,MOTRIN) 800 MG tablet TAKE 1 TABLET BY MOUTH EVERY 8 HOURS AS NEEDED FOR MODERATE PAIN. 90 tablet 2  . lidocaine-prilocaine (EMLA) cream Apply to affected area once 30 g 3  . LORazepam (ATIVAN) 0.5 MG tablet Take 1 tablet (0.5 mg total) by mouth every 6 (six) hours as needed (Nausea or vomiting). 30 tablet 0  . omeprazole (PRILOSEC) 20 MG capsule Take 1 capsule (20 mg total) by mouth daily. (Patient not taking: Reported on 02/12/2017) 30 capsule 5  . ondansetron (ZOFRAN) 8 MG tablet Take 1 tablet (8 mg total) by mouth 2 (two) times daily as needed (Nausea or vomiting). 30 tablet 1  . oxyCODONE-acetaminophen (PERCOCET) 10-325 MG tablet Take 1 tablet 2 (two) times daily as needed by mouth for pain. 45 tablet 0  . prochlorperazine (COMPAZINE) 10 MG tablet Take 1 tablet (10 mg total) by mouth every 6 (six) hours as needed (Nausea or vomiting). 30 tablet 1  . sulfamethoxazole-trimethoprim (BACTRIM DS,SEPTRA DS) 800-160 MG tablet Take 1 tablet by mouth daily. 30 tablet 3  . tamsulosin (FLOMAX) 0.4 MG CAPS capsule TAKE ONE CAPSULE BY MOUTH AT BEDTIME 30 capsule 3  . zolendronic acid (ZOMETA) 4 MG/5ML  injection Inject 4 mg into the vein. QOMonth     No current facility-administered medications for this visit.    Facility-Administered Medications Ordered in Other Visits  Medication Dose Route Frequency Provider Last Rate Last Dose  . sodium chloride 0.9 % injection 10 mL  10 mL Intracatheter PRN [facility], PA-C   10 mL at 08/04/14 1103    Review of Systems: Review of Systems  Constitutional: Positive for appetite change and fatigue.  Neurological: Positive for dizziness and numbness.  Hematological: Bruises/bleeds easily.  All other systems reviewed and are negative.    PHYSICAL EXAMINATION There were no vitals taken for this visit.  ECOG PERFORMANCE STATUS: 2 - Symptomatic, <50% confined to bed  Physical Exam  Constitutional: He is oriented to person, place, and time.  Pleasant elderly male who appears to be alert, awake, and oriented.  Appears to be somewhat uncomfortable.  HENT:  Head: Normocephalic.  Mouth/Throat: Oropharynx is clear and moist. No oropharyngeal exudate.  Several areas of irregular calvarial bulging with associated tenderness and pain.  No skin lesions.  Eyes: Conjunctivae and EOM are normal. Pupils are equal, round, and reactive to light. No scleral icterus.  Neck: No thyromegaly present.  Cardiovascular: Normal rate, regular rhythm and normal heart sounds.  No murmur heard. Pulmonary/Chest: Effort normal and breath sounds normal. No respiratory distress. He has no wheezes. He has no rales.  Abdominal: Soft. Bowel sounds are normal. He exhibits no distension. There is no tenderness. There is no rebound.  Musculoskeletal: He exhibits no edema.  Lymphadenopathy:    He has no cervical adenopathy.  Neurological: He is alert  and oriented to person, place, and time. He has normal reflexes. No cranial nerve deficit.  Skin: Skin is warm and dry. No rash noted. No erythema.     LABORATORY DATA: I have personally reviewed the data as  listed: Appointment on 02/18/2017  Component Date Value Ref Range Status  . WBC 02/18/2017 5.5  4.0 - 10.5 K/uL Final  . RBC 02/18/2017 4.71  4.22 - 5.81 MIL/uL Final  . Hemoglobin 02/18/2017 14.3  13.0 - 17.0 g/dL Final  . HCT 02/18/2017 43.2  39.0 - 52.0 % Final  . MCV 02/18/2017 91.7  78.0 - 100.0 fL Final  . MCH 02/18/2017 30.4  26.0 - 34.0 pg Final  . MCHC 02/18/2017 33.1  30.0 - 36.0 g/dL Final  . RDW 02/18/2017 13.7  11.5 - 15.5 % Final  . Platelets 02/18/2017 121* 150 - 400 K/uL Final  . Neutrophils Relative % 02/18/2017 65  % Final  . Neutro Abs 02/18/2017 3.6  1.7 - 7.7 K/uL Final  . Lymphocytes Relative 02/18/2017 23  % Final  . Lymphs Abs 02/18/2017 1.3  0.7 - 4.0 K/uL Final  . Monocytes Relative 02/18/2017 11  % Final  . Monocytes Absolute 02/18/2017 0.6  0.1 - 1.0 K/uL Final  . Eosinophils Relative 02/18/2017 1  % Final  . Eosinophils Absolute 02/18/2017 0.0  0.0 - 0.7 K/uL Final  . Basophils Relative 02/18/2017 0  % Final  . Basophils Absolute 02/18/2017 0.0  0.0 - 0.1 K/uL Final  . Sodium 02/18/2017 140  135 - 145 mmol/L Final  . Potassium 02/18/2017 4.3  3.5 - 5.1 mmol/L Final  . Chloride 02/18/2017 107  101 - 111 mmol/L Final  . CO2 02/18/2017 23  22 - 32 mmol/L Final  . Glucose, Bld 02/18/2017 96  65 - 99 mg/dL Final  . BUN 02/18/2017 22* 6 - 20 mg/dL Final  . Creatinine, Ser 02/18/2017 1.15  0.61 - 1.24 mg/dL Final  . Calcium 02/18/2017 9.2  8.9 - 10.3 mg/dL Final  . Total Protein 02/18/2017 6.5  6.5 - 8.1 g/dL Final  . Albumin 02/18/2017 3.7  3.5 - 5.0 g/dL Final  . AST 02/18/2017 17  15 - 41 U/L Final  . ALT 02/18/2017 14* 17 - 63 U/L Final  . Alkaline Phosphatase 02/18/2017 24* 38 - 126 U/L Final  . Total Bilirubin 02/18/2017 0.5  0.3 - 1.2 mg/dL Final  . GFR calc non Af Amer 02/18/2017 59* >60 mL/min Final  . GFR calc Af Amer 02/18/2017 >60  >60 mL/min Final   Comment: (NOTE) The eGFR has been calculated using the CKD EPI equation. This calculation has  not been validated in all clinical situations. eGFR's persistently <60 mL/min signify possible Chronic Kidney Disease.   . Anion gap 02/18/2017 10  5 - 15 Final  . Magnesium 02/18/2017 2.2  1.7 - 2.4 mg/dL Final  . LDH 02/18/2017 114  98 - 192 U/L Final  . Beta-2 Microglobulin 02/18/2017 1.7  0.6 - 2.4 mg/L Final   Comment: (NOTE) Siemens Immulite 2000 Immunochemiluminometric assay (ICMA) Values obtained with different assay methods or kits cannot be used interchangeably. Results cannot be interpreted as absolute evidence of the presence or absence of malignant disease. Performed At: [facility] [place], [place] [id number] Rush Farmer MD [id number]   Hospital Outpatient Visit on 02/17/2017  Component Date Value Ref Range Status  . Glucose-Capillary 02/17/2017 87  65 - 99 mg/dL Final       Mikhail  Kauk Mody, MD

## 2017-03-04 NOTE — Patient Instructions (Signed)
[facility] Discharge Instructions for Patients Receiving Chemotherapy   Beginning January 23rd 2017 lab work for the Cancer Center will be done in the  Main lab at [facility] on 1st floor. If you have a lab appointment with the Cancer Center please come in thru the  Main Entrance and check in at the main information desk   Today you received the following chemotherapy agents   To help prevent nausea and vomiting after your treatment, we encourage you to take your nausea medication     If you develop nausea and vomiting, or diarrhea that is not controlled by your medication, call the clinic.  The clinic phone number is [phone]. Office hours are Monday-Friday 8:30am-5:00pm.  BELOW ARE SYMPTOMS THAT SHOULD BE REPORTED IMMEDIATELY:  *FEVER GREATER THAN 101.0 F  *CHILLS WITH OR WITHOUT FEVER  NAUSEA AND VOMITING THAT IS NOT CONTROLLED WITH YOUR NAUSEA MEDICATION  *UNUSUAL SHORTNESS OF BREATH  *UNUSUAL BRUISING OR BLEEDING  TENDERNESS IN MOUTH AND THROAT WITH OR WITHOUT PRESENCE OF ULCERS  *URINARY PROBLEMS  *BOWEL PROBLEMS  UNUSUAL RASH Items with * indicate a potential emergency and should be followed up as soon as possible. If you have an emergency after office hours please contact your primary care physician or go to the nearest emergency department.  Please call the clinic during office hours if you have any questions or concerns.   You may also contact the Patient Navigator at [phone] should you have any questions or need assistance in obtaining follow up care.      Resources For Cancer Patients and their Caregivers ? American Cancer Society: Can assist with transportation, wigs, general needs, runs Look Good Feel Better.        1-888-227-6333 ? Cancer Care: Provides financial assistance, online support groups, medication/co-pay assistance.  1-800-813-HOPE (4673) ? [facility] Assists [place] cancer  patients and their families through emotional , educational and financial support.  336-427-4357 ? [place] DSS Where to apply for food stamps, Medicaid and utility assistance. 336-342-1394 ? [facility]: Transportation to medical appointments. 336-347-2287 ? Social Security Administration: May apply for disability if have a Stage IV cancer. 336-342-7796 1-800-772-1213 ? [place] Aging, Disability and Transit Services: Assists with nutrition, care and transit needs. 336-349-2343         

## 2017-03-04 NOTE — Progress Notes (Signed)
Noted platelet count of 98,000 per Dr. Walden Field. Will treat today with Velcade. Return to see Dr. Walden Field in 1 month. PET scan and labs reviewed with patient today by Dr. Walden Field.

## 2017-03-05 ENCOUNTER — Encounter: Payer: Self-pay | Admitting: Radiation Oncology

## 2017-03-05 LAB — BETA 2 MICROGLOBULIN, SERUM: Beta-2 Microglobulin: 1.7 mg/L (ref 0.6–2.4)

## 2017-03-05 LAB — KAPPA/LAMBDA LIGHT CHAINS
Kappa free light chain: 30.7 mg/L — ABNORMAL HIGH (ref 3.3–19.4)
Kappa, lambda light chain ratio: 2.06 — ABNORMAL HIGH (ref 0.26–1.65)
Lambda free light chains: 14.9 mg/L (ref 5.7–26.3)

## 2017-03-05 NOTE — Progress Notes (Signed)
  Radiation Oncology         [phone] ________________________________  Name: Dean [name]. MRN: [id number]  Date: 03/05/2017  DOB: 09/07/1935  End of Treatment Note  Diagnosis: Recurrent multiple myeloma     Indication for treatment: Palliative, pain control      Radiation treatment dates:  02/19/17-02/22/17  Site/dose:  Right temporal Scalp/ 30 Gy in 10 fractions  Beams/energy: 3D/ 6X  Narrative: The patient tolerated radiation treatment relatively well. During treatment the patient had erythema to the right scalp/ temporal area. The swelling flattened out nicely. There was a decrease in size of soft tissue mass in the right temporal lobe. Pain also improved  Plan: The patient has completed radiation treatment. The patient will return to radiation oncology clinic for routine followup in one month. I advised them to call or return sooner if they have any questions or concerns related to their recovery or treatment.  -----------------------------------  Blair Promise, PhD, MD  This document serves as a record of services personally performed by Gery Pray, MD. It was created on his behalf by Bethann Humble, a trained medical scribe. The creation of this record is based on the scribe's personal observations and the provider's statements to them. This document has been checked and approved by the attending provider.

## 2017-03-06 ENCOUNTER — Telehealth (HOSPITAL_COMMUNITY): Payer: Self-pay | Admitting: *Deleted

## 2017-03-06 LAB — MULTIPLE MYELOMA PANEL, SERUM
Albumin SerPl Elph-Mcnc: 3.2 g/dL (ref 2.9–4.4)
Albumin/Glob SerPl: 1.3 (ref 0.7–1.7)
Alpha 1: 0.3 g/dL (ref 0.0–0.4)
Alpha2 Glob SerPl Elph-Mcnc: 0.8 g/dL (ref 0.4–1.0)
B-Globulin SerPl Elph-Mcnc: 0.9 g/dL (ref 0.7–1.3)
Gamma Glob SerPl Elph-Mcnc: 0.5 g/dL (ref 0.4–1.8)
Globulin, Total: 2.5 g/dL (ref 2.2–3.9)
IgA: 89 mg/dL (ref 61–437)
IgG (Immunoglobin G), Serum: 528 mg/dL — ABNORMAL LOW (ref 700–1600)
IgM (Immunoglobulin M), Srm: 38 mg/dL (ref 15–143)
M Protein SerPl Elph-Mcnc: 0.3 g/dL — ABNORMAL HIGH
Total Protein ELP: 5.7 g/dL — ABNORMAL LOW (ref 6.0–8.5)

## 2017-03-06 NOTE — Telephone Encounter (Signed)
Patient called in today and stated that his feet and legs were swollen. Patient stated that he had Lasix 20mg  at home. [name] said for pt to take 1 tablet today, 1 on Saturday, 1 Sunday and to call us on Monday with a progress report. Patient verbalized understanding of instructions.

## 2017-03-06 NOTE — Progress Notes (Signed)
Diagnosis Multiple myeloma in relapse ([place]) - Plan: CBC with Differential, Comprehensive metabolic panel, Lactate dehydrogenase, CBC with Differential, Protein electrophoresis, serum, IgG, IgA, IgM, Beta 2 microglobuline, serum  Staging Cancer Staging No matching staging information was found for the patient.  Assessment and Plan:  1.  Multiple myeloma without remission ([place]) 82 y.o. male with diagnosis of IgG kappa multiple myeloma with previous difficulty tolerating systemic therapy.  Prior imaging demonstrates progressive disease in the calvarium with associated severe pain.  PET/CT was done on 02/17/2017 and was reviewed with the patient by Dr. Lebron [name] and showed improving disease with no new bone lesions.  He remains on Velcade and Zometa.    2.  Joint pain.  He relates this to a recent fall.  Recent PET scan that was done January 2019 showed improving disease and no new bone lesions.  3.  Thrombocytopenia.  Platelet count is noted to be decreased at 98,000.  We will continue to monitor labs as therapy proceeds.   Interval History:  82 y.o. male with IgG kappa multiple myeloma.   He was treated with radiation therapy for palliation of skeletal pain with significant improvement of pain in the areas that have received radiation.  Current Status: Patient is here today for follow-up and for Velcade.  He completed radiation therapy today.  He reports some joint pain from a recent fall.     Multiple myeloma without remission ([place])   11/16/2012 Initial Diagnosis    L occipital condyle destructive lesion, not biopsied secondary to location. XRT      12/01/2012 Imaging    L3 superior endplate compression FX, no lytic lesions to suggest myeloma      02/06/2014 Imaging    soft tissue mass along the right posterior fifth rib with some rib destruction      02/16/2014 Miscellaneous    Normal CBC, Normal CMP, kappa,lamda ratio of 2.62 (abnormal), UIEP with slightly restricted band, IgG kappa,  SPEP/IEP with monoclonal protein at 0.79 g/dl, normal igG, suppressed IGM      02/20/2014 Bone Marrow Biopsy    33% kappa restricted plasma cells Normal FISH, normal cytogenetics      02/21/2014 - 03/08/2014 Radiation Therapy    30Gy to R rib lesion, lesion not biopsied      03/16/2014 PET scan    Scattered hypermetabolic osseous lesions, including the calvarium, ribs, left scapula, sacrum, and left femoral shaft. An expansile left vertebral body lesion at L3 extends into the epidural space of the left lateral recess,       03/17/2014 - 04/07/2014 Chemotherapy    Velcade and Dexamethasone due to initial denial of Revlimid by insurance company.  Zometa monthly.      03/22/2014 Imaging    MR_ L-Spine- Enhancing lesions compatible with multiple myeloma at L2, L3, and S1-2.      04/07/2014 - 07/28/2014 Chemotherapy    RVD with Revlimid at 25 mg days 1-14.  Revlimid dose reduced to 25 mg every other day x 14 days with 7 day respite beginning on day 1 of cycle 2.      04/17/2014 Adverse Reaction    Revlimid-induced rash.  Treated with steroids.      07/28/2014 - 08/04/2014 Chemotherapy    Revlimid daily      08/04/2014 Adverse Reaction    Velcade-induced peripheral neuropathy      08/04/2014 Treatment Plan Change    D/C Velcade      08/11/2014 PET scan    Response to therapy.  Improvement and resolution of foci of osseous hypermetabolism.      08/22/2014 - 08/28/2014 Chemotherapy    Revlimid 25 mg days 1-21 every 28 days      08/28/2014 Adverse Reaction    Revlimid-induced rash. Medication held.  Medrol dose Pak prescribed.      09/04/2014 - 01/08/2015 Chemotherapy    Revlimid 10 mg every other day, without dexamethasone      01/08/2015 - 01/15/2015 Hospital Admission    Colonic diverticular abscess with IR drain placement      03/02/2015 PET scan    Only bony uptake is low activity at site of deformity/callus at R second rib FX, high activity in sigmoid colon, advanced sigmoid  divertidulosis. Abscess not resolved?      06/04/2015 Imaging    CT nonobstructive L renal calculus, diverticulosis of descending and sigmoid colon without inflammation, moderate prostatic enlargement      07/12/2015 Surgery    Diverticulitis s/p robotic sigmoid colectomy with Dr. Johney Maine      08/23/2015 PET scan    Primarily similar hypermetabolic osseous foci within the R sided ribs. new L third rib focus of hypermetab and sclerosis is favored to be related to healing FX. no soft tissue myeloma id'd. Presumed postop hypermetab and edema about the R pelvic wall       03/06/2016 PET scan    1. Reduced activity in the prior lesion such as the right second and fifth rib lesions, activity nearly completely resolved and significantly less than added mediastinal blood pool activity. No new lesions are identified. 2. Other imaging findings of potential clinical significance: Coronary, aortic arch, and branch vessel atherosclerotic vascular disease. Aortoiliac atherosclerotic vascular disease. Enlarged prostate gland. Colonic diverticula.      02/17/2017 PET scan    HEAD/NECK: No hypermetabolic activity in the scalp. No hypermetabolic cervical lymph nodes. CHEST: No hypermetabolic mediastinal or hilar nodes. Right upper lobe scarring/atelectasis. No suspicious pulmonary nodules on the CT scan. ABDOMEN/PELVIS: No abnormal hypermetabolic activity within the liver, pancreas, adrenal glands, or spleen. No hypermetabolic lymph nodes in the abdomen or pelvis. Atherosclerotic calcifications of the abdominal aorta and branch vessels. Left renal sinus cysts. Sigmoid diverticulosis, without evidence of diverticulitis. Prostatomegaly. SKELETON: Vague hypermetabolism involving the inferior sternum, max SUV 3.2, previously 8.2. Focal hypermetabolism involving the left sacrum, max SUV 4.0, previously 6.7. EXTREMITIES: No abnormal hypermetabolic activity in the lower extremities.      02/18/2017 -  Chemotherapy     Bortezomib 1.73m/m2 QWk + Dexamethasone 163mQTue/Fri --Cycle #1, 02/18/17       Problem List Patient Active Problem List   Diagnosis Date Noted  . Spinal stenosis, lumbar [M48.061] 10/13/2016  . Chronic back pain [M54.9, G89.29] 10/01/2016  . DDD (degenerative disc disease), lumbar [M51.36] 10/01/2016  . OA (osteoarthritis) of knee [M17.10] 10/01/2016  . Diverticulitis s/p robotic sigmoid colectomy 07/11/2015 [K57.32] 07/11/2015  . Special screening for malignant neoplasms, colon [Z12.11]   . Abnormal CT scan, sigmoid colon [R93.3] 04/02/2015  . Abnormal PET scan of colon [R94.8] 04/02/2015  . Thrombocytopenia ([place][D69.6] 01/09/2015  . Multiple myeloma without remission ([place][C90.00] 02/15/2014  . Allergic rhinitis [J30.9] 01/16/2014  . History of vertebral stress fracture [Z87.312] 01/16/2014  . Dermatitis [L30.9] 01/16/2014  . Loss of weight [R63.4] 09/05/2013  . Insomnia [G47.00] 09/05/2013  . Fatigue [R53.83] 05/16/2013  . Neck pain [M54.2] 12/07/2012  . Skull lesion [M89.9] 11/29/2012  . Neuropathy ([place][G62.9]   . BPH (benign prostatic hyperplasia) [N40.0]   .  GERD (gastroesophageal reflux disease) [K21.9]   . H/O ETOH abuse [Z87.898]     Past Medical History Past Medical History:  Diagnosis Date  . BPH (benign prostatic hyperplasia)   . Colonic diverticular abscess 01/08/2015  . Diverticulitis 16/10/96   complicated by abscess and required percutaneous drainage  . GERD (gastroesophageal reflux disease)   . H/O ETOH abuse   . History of chemotherapy last done jan 2017  . History of radiation therapy 01/05/13-02/10/13   45 gray to left occipital condyle region  . History of radiation therapy 12/01/16-12/10/16   Parasternal nodule, chest- 24 Gy total delivered in 8 fractions, Left sacro-iliac, pelvis- 24 Gy total delivered in 8 fractions   . Intra-abdominal abscess ([place])   . Multiple myeloma ([place]) 2015  . Neuropathy   . Radiation 02/21/14-03/08/14   right posterior  chest wall area 30 gray  . Radiation 04/19/14-05/02/14   lumbar spine 25 gray  . Skull lesion    Left occipital condyle  . Wrist fracture, left    x 2    Past Surgical History Past Surgical History:  Procedure Laterality Date  . COLONOSCOPY N/A 04/19/2015   Procedure: COLONOSCOPY;  Surgeon: Danie Binder, MD;  Location: [facility];  Service: Endoscopy;  Laterality: N/A;  1:30 PM  . CYST EXCISION  1959   tail bone    Family History Family History  Problem Relation Age of Onset  . Diabetes Mother   . Stroke Mother   . Hypertension Father      Social History  reports that  has never smoked. he has never used smokeless tobacco. He reports that he does not drink alcohol or use drugs.  Medications  Current Outpatient Medications:  .  acyclovir (ZOVIRAX) 400 MG tablet, Take 1 tablet (400 mg total) by mouth 2 (two) times daily., Disp: 60 tablet, Rfl: 3 .  ALPRAZolam (XANAX) 1 MG tablet, TAKE 2 TABLETS BY MOUTH AT BEDTIME AS NEEDED FOR ANXIETY AND OR SLEEP, Disp: 60 tablet, Rfl: 2 .  aspirin EC 325 MG tablet, Take 1 tablet (325 mg total) by mouth daily., Disp: 30 tablet, Rfl: 11 .  CALCIUM-VITAMIN D PO, Take by mouth. 1500/1000 PO QD, Disp: , Rfl:  .  dexamethasone (DECADRON) 4 MG tablet, Take 2.5 tablets (10 mg total) by mouth 2 (two) times a week. Take on Tue/Fri every week, do not start until bortezomib is initiated, Disp: 60 tablet, Rfl: 0 .  diclofenac sodium (VOLTAREN) 1 % GEL, APPLY TO AFFECTED AREA 4 TIMES A DAY AS NEEDED, Disp: 100 g, Rfl: 11 .  gabapentin (NEURONTIN) 400 MG capsule, TAKE 5 CAPSULES BY MOUTH (2000 MG TOTAL)DAILY, Disp: 150 capsule, Rfl: 3 .  ibuprofen (ADVIL,MOTRIN) 800 MG tablet, TAKE 1 TABLET BY MOUTH EVERY 8 HOURS AS NEEDED FOR MODERATE PAIN., Disp: 90 tablet, Rfl: 2 .  lidocaine-prilocaine (EMLA) cream, Apply to affected area once, Disp: 30 g, Rfl: 3 .  LORazepam (ATIVAN) 0.5 MG tablet, Take 1 tablet (0.5 mg total) by mouth every 6 (six) hours as needed  (Nausea or vomiting)., Disp: 30 tablet, Rfl: 0 .  omeprazole (PRILOSEC) 20 MG capsule, Take 1 capsule (20 mg total) by mouth daily. (Patient not taking: Reported on 02/12/2017), Disp: 30 capsule, Rfl: 5 .  ondansetron (ZOFRAN) 8 MG tablet, Take 1 tablet (8 mg total) by mouth 2 (two) times daily as needed (Nausea or vomiting)., Disp: 30 tablet, Rfl: 1 .  oxyCODONE-acetaminophen (PERCOCET) 10-325 MG tablet, Take 1 tablet 2 (two)  times daily as needed by mouth for pain., Disp: 45 tablet, Rfl: 0 .  prochlorperazine (COMPAZINE) 10 MG tablet, Take 1 tablet (10 mg total) by mouth every 6 (six) hours as needed (Nausea or vomiting)., Disp: 30 tablet, Rfl: 1 .  sulfamethoxazole-trimethoprim (BACTRIM DS,SEPTRA DS) 800-160 MG tablet, Take 1 tablet by mouth daily., Disp: 30 tablet, Rfl: 3 .  tamsulosin (FLOMAX) 0.4 MG CAPS capsule, TAKE ONE CAPSULE BY MOUTH AT BEDTIME, Disp: 30 capsule, Rfl: 3 .  zolendronic acid (ZOMETA) 4 MG/5ML injection, Inject 4 mg into the vein. QOMonth, Disp: , Rfl:  No current facility-administered medications for this visit.   Facility-Administered Medications Ordered in Other Visits:  .  sodium chloride 0.9 % injection 10 mL, 10 mL, Intracatheter, PRN, [facility], PA-C, 10 mL at 08/04/14 1103  Allergies Codeine; Morphine and related; and Revlimid [lenalidomide]  Review of Systems Review of Systems - Oncology ROS as per HPI otherwise 12 point ROS is negative other than joint pain due to recent fall.     Physical Exam  Vitals Wt Readings from Last 3 Encounters:  03/04/17 250 lb (113.4 kg)  02/26/17 240 lb (108.9 kg)  02/18/17 239 lb 3.2 oz (108.5 kg)   Temp Readings from Last 3 Encounters:  03/04/17 98 F (36.7 C) (Oral)  02/26/17 98 F (36.7 C) (Oral)  02/18/17 98 F (36.7 C) (Oral)   BP Readings from Last 3 Encounters:  03/04/17 (!) 164/64  02/26/17 137/68  02/18/17 (!) 154/69   Pulse Readings from Last 3 Encounters:  03/04/17 83  02/26/17 60  02/18/17  92   Constitutional: Well-developed, well-nourished, and in no distress.   HENT:  Head: Normocephalic and atraumatic.  Mouth/Throat: No oropharyngeal exudate. Mucosa moist. Eyes: Pupils are equal, round, and reactive to light. Conjunctivae are normal. No scleral icterus.  Neck: Normal range of motion. Neck supple. No JVD present.  Cardiovascular: Normal rate, regular rhythm and normal heart sounds.  Exam reveals no gallop and no friction rub.   No murmur heard. Pulmonary/Chest: Effort normal and breath sounds normal. No respiratory distress. No wheezes.No rales.  Abdominal: Soft. Bowel sounds are normal. No distension. There is no tenderness. There is no guarding.  Musculoskeletal: No edema or tenderness.  Lymphadenopathy: No cervical or supraclavicular adenopathy.  Neurological: Alert and oriented to person, place, and time. No cranial nerve deficit.  Skin: Skin is warm and dry. No rash noted. No erythema. No pallor.  Psychiatric: Affect and judgment normal.   Labs Appointment on 03/04/2017  Component Date Value Ref Range Status  . WBC 03/04/2017 4.9  4.0 - 10.5 K/uL Final  . RBC 03/04/2017 4.39  4.22 - 5.81 MIL/uL Final  . Hemoglobin 03/04/2017 13.4  13.0 - 17.0 g/dL Final  . HCT 03/04/2017 41.6  39.0 - 52.0 % Final  . MCV 03/04/2017 94.8  78.0 - 100.0 fL Final  . MCH 03/04/2017 30.5  26.0 - 34.0 pg Final  . MCHC 03/04/2017 32.2  30.0 - 36.0 g/dL Final  . RDW 03/04/2017 14.1  11.5 - 15.5 % Final  . Platelets 03/04/2017 98* 150 - 400 K/uL Final   Comment: SPECIMEN CHECKED FOR CLOTS PLATELET COUNT CONFIRMED BY SMEAR   . Neutrophils Relative % 03/04/2017 68  % Final  . Neutro Abs 03/04/2017 3.3  1.7 - 7.7 K/uL Final  . Lymphocytes Relative 03/04/2017 22  % Final  . Lymphs Abs 03/04/2017 1.1  0.7 - 4.0 K/uL Final  . Monocytes Relative 03/04/2017 10  %  Final  . Monocytes Absolute 03/04/2017 0.5  0.1 - 1.0 K/uL Final  . Eosinophils Relative 03/04/2017 0  % Final  . Eosinophils  Absolute 03/04/2017 0.0  0.0 - 0.7 K/uL Final  . Basophils Relative 03/04/2017 0  % Final  . Basophils Absolute 03/04/2017 0.0  0.0 - 0.1 K/uL Final  . Sodium 03/04/2017 140  135 - 145 mmol/L Final  . Potassium 03/04/2017 4.2  3.5 - 5.1 mmol/L Final  . Chloride 03/04/2017 104  101 - 111 mmol/L Final  . CO2 03/04/2017 26  22 - 32 mmol/L Final  . Glucose, Bld 03/04/2017 95  65 - 99 mg/dL Final  . BUN 03/04/2017 22* 6 - 20 mg/dL Final  . Creatinine, Ser 03/04/2017 1.00  0.61 - 1.24 mg/dL Final  . Calcium 03/04/2017 9.1  8.9 - 10.3 mg/dL Final  . Total Protein 03/04/2017 6.3* 6.5 - 8.1 g/dL Final  . Albumin 03/04/2017 3.6  3.5 - 5.0 g/dL Final  . AST 03/04/2017 22  15 - 41 U/L Final  . ALT 03/04/2017 19  17 - 63 U/L Final  . Alkaline Phosphatase 03/04/2017 24* 38 - 126 U/L Final  . Total Bilirubin 03/04/2017 0.4  0.3 - 1.2 mg/dL Final  . GFR calc non Af Amer 03/04/2017 >60  >60 mL/min Final  . GFR calc Af Amer 03/04/2017 >60  >60 mL/min Final   Comment: (NOTE) The eGFR has been calculated using the CKD EPI equation. This calculation has not been validated in all clinical situations. eGFR's persistently <60 mL/min signify possible Chronic Kidney Disease.   . Anion gap 03/04/2017 10  5 - 15 Final  . Kappa free light chain 03/04/2017 30.7* 3.3 - 19.4 mg/L Final  . Lamda free light chains 03/04/2017 14.9  5.7 - 26.3 mg/L Final  . Kappa, lamda light chain ratio 03/04/2017 2.06* 0.26 - 1.65 Final   Comment: (NOTE) Performed At: [facility] [place], [place] [id number] Rush Farmer MD [id number]   . Beta-2 Microglobulin 03/04/2017 1.7  0.6 - 2.4 mg/L Final   Comment: (NOTE) Siemens Immulite 2000 Immunochemiluminometric assay (ICMA) Values obtained with different assay methods or kits cannot be used interchangeably. Results cannot be interpreted as absolute evidence of the presence or absence of malignant disease. Performed At: [facility] [place], [place] [id number] Rush Farmer MD [id number]      Pathology Orders Placed This Encounter  Procedures  . CBC with Differential    Standing Status:   Future    Standing Expiration Date:   03/04/2018  . Comprehensive metabolic panel    Standing Status:   Future    Standing Expiration Date:   03/04/2018  . Lactate dehydrogenase    Standing Status:   Future    Standing Expiration Date:   03/04/2018  . CBC with Differential    Standing Status:   Standing    Number of Occurrences:   3    Standing Expiration Date:   03/04/2018  . Protein electrophoresis, serum    Standing Status:   Future    Standing Expiration Date:   03/04/2018  . IgG, IgA, IgM    Standing Status:   Future    Standing Expiration Date:   03/04/2018  . Beta 2 microglobuline, serum    Standing Status:   Future    Standing Expiration Date:   03/04/2018    Pet scan done 02/17/2017:  MPRESSION: Improving hypermetabolism involving the inferior sternum and left sacrum.  No new osseous lesions.   Zoila Shutter MD

## 2017-03-11 ENCOUNTER — Inpatient Hospital Stay (HOSPITAL_COMMUNITY): Payer: Medicare Other

## 2017-03-11 ENCOUNTER — Encounter (HOSPITAL_COMMUNITY): Payer: Self-pay

## 2017-03-11 ENCOUNTER — Inpatient Hospital Stay (HOSPITAL_COMMUNITY): Payer: Medicare Other | Attending: Oncology

## 2017-03-11 VITALS — BP 115/84 | HR 71 | Temp 97.5°F | Resp 18 | Wt 243.0 lb

## 2017-03-11 DIAGNOSIS — C9002 Multiple myeloma in relapse: Secondary | ICD-10-CM | POA: Insufficient documentation

## 2017-03-11 DIAGNOSIS — Z5112 Encounter for antineoplastic immunotherapy: Secondary | ICD-10-CM | POA: Diagnosis not present

## 2017-03-11 DIAGNOSIS — C9000 Multiple myeloma not having achieved remission: Secondary | ICD-10-CM

## 2017-03-11 LAB — COMPREHENSIVE METABOLIC PANEL
ALT: 18 U/L (ref 17–63)
AST: 20 U/L (ref 15–41)
Albumin: 3.9 g/dL (ref 3.5–5.0)
Alkaline Phosphatase: 24 U/L — ABNORMAL LOW (ref 38–126)
Anion gap: 11 (ref 5–15)
BUN: 21 mg/dL — ABNORMAL HIGH (ref 6–20)
CO2: 23 mmol/L (ref 22–32)
Calcium: 9.2 mg/dL (ref 8.9–10.3)
Chloride: 103 mmol/L (ref 101–111)
Creatinine, Ser: 1.12 mg/dL (ref 0.61–1.24)
GFR calc Af Amer: >60 mL/min (ref >60)
GFR calc non Af Amer: >60 mL/min (ref >60)
Glucose, Bld: 97 mg/dL (ref 65–99)
Potassium: 4.6 mmol/L (ref 3.5–5.1)
Sodium: 137 mmol/L (ref 135–145)
Total Bilirubin: 0.6 mg/dL (ref 0.3–1.2)
Total Protein: 6.7 g/dL (ref 6.5–8.1)

## 2017-03-11 LAB — CBC WITH DIFFERENTIAL/PLATELET
Basophils Absolute: 0 10*3/uL (ref 0.0–0.1)
Basophils Relative: 0 %
Eosinophils Absolute: 0 10*3/uL (ref 0.0–0.7)
Eosinophils Relative: 0 %
HCT: 43.1 % (ref 39.0–52.0)
Hemoglobin: 14 g/dL (ref 13.0–17.0)
Lymphocytes Relative: 19 %
Lymphs Abs: 1.1 10*3/uL (ref 0.7–4.0)
MCH: 30.4 pg (ref 26.0–34.0)
MCHC: 32.5 g/dL (ref 30.0–36.0)
MCV: 93.7 fL (ref 78.0–100.0)
Monocytes Absolute: 0.5 10*3/uL (ref 0.1–1.0)
Monocytes Relative: 9 %
Neutro Abs: 4.1 10*3/uL (ref 1.7–7.7)
Neutrophils Relative %: 72 %
Platelets: 114 10*3/uL — ABNORMAL LOW (ref 150–400)
RBC: 4.6 MIL/uL (ref 4.22–5.81)
RDW: 13.8 % (ref 11.5–15.5)
WBC: 5.6 10*3/uL (ref 4.0–10.5)

## 2017-03-11 LAB — LACTATE DEHYDROGENASE: LDH: 135 U/L (ref 98–192)

## 2017-03-11 MED ORDER — PROCHLORPERAZINE MALEATE 10 MG PO TABS
ORAL_TABLET | ORAL | Status: AC
  Filled 2017-03-11: qty 1

## 2017-03-11 MED ORDER — PROCHLORPERAZINE MALEATE 10 MG PO TABS
10.0000 mg | ORAL_TABLET | Freq: Once | ORAL | Status: AC
  Administered 2017-03-11: 10 mg via ORAL

## 2017-03-11 MED ORDER — SODIUM CHLORIDE 0.9 % IV SOLN
INTRAVENOUS | Status: DC
  Administered 2017-03-11: 14:00:00 via INTRAVENOUS

## 2017-03-11 MED ORDER — ZOLEDRONIC ACID 4 MG/100ML IV SOLN
4.0000 mg | Freq: Once | INTRAVENOUS | Status: AC
  Administered 2017-03-11: 4 mg via INTRAVENOUS
  Filled 2017-03-11: qty 100

## 2017-03-11 MED ORDER — BORTEZOMIB CHEMO SQ INJECTION 3.5 MG (2.5MG/ML)
1.3000 mg/m2 | Freq: Once | INTRAMUSCULAR | Status: AC
  Administered 2017-03-11: 3 mg via SUBCUTANEOUS
  Filled 2017-03-11: qty 3

## 2017-03-11 MED ORDER — SODIUM CHLORIDE 0.9 % IJ SOLN
3.0000 mL | Freq: Once | INTRAMUSCULAR | Status: DC | PRN
Start: 2017-03-11 — End: 2017-03-11

## 2017-03-11 NOTE — Progress Notes (Signed)
Dean [name]. tolerated Velcade injection and Zometa infusion well without complaints or incident. Labs reviewed prior to administering these medications. Calcium 9.2 today and pt denied any tooth,jaw or upper leg pain and no recent or future dental visits as well. VSS upon discharge. Pt discharged self ambulatory using cane in satisfactory condition accompanied by his wife

## 2017-03-11 NOTE — Patient Instructions (Signed)
[facility] Discharge Instructions for Patients Receiving Chemotherapy   Beginning January 23rd 2017 lab work for the Cancer Center will be done in the  Main lab at  on 1st floor. If you have a lab appointment with the Cancer Center please come in thru the  Main Entrance and check in at the main information desk   Today you received the following chemotherapy agents Velcade injection as well as Zometa infusion. Follow-up as scheduled. Call clinic for any questions or concerns  To help prevent nausea and vomiting after your treatment, we encourage you to take your nausea medication   If you develop nausea and vomiting, or diarrhea that is not controlled by your medication, call the clinic.  The clinic phone number is [phone]. Office hours are Monday-Friday 8:30am-5:00pm.  BELOW ARE SYMPTOMS THAT SHOULD BE REPORTED IMMEDIATELY:  *FEVER GREATER THAN 101.0 F  *CHILLS WITH OR WITHOUT FEVER  NAUSEA AND VOMITING THAT IS NOT CONTROLLED WITH YOUR NAUSEA MEDICATION  *UNUSUAL SHORTNESS OF BREATH  *UNUSUAL BRUISING OR BLEEDING  TENDERNESS IN MOUTH AND THROAT WITH OR WITHOUT PRESENCE OF ULCERS  *URINARY PROBLEMS  *BOWEL PROBLEMS  UNUSUAL RASH Items with * indicate a potential emergency and should be followed up as soon as possible. If you have an emergency after office hours please contact your primary care physician or go to the nearest emergency department.  Please call the clinic during office hours if you have any questions or concerns.   You may also contact the Patient Navigator at [phone] should you have any questions or need assistance in obtaining follow up care.      Resources For Cancer Patients and their Caregivers ? American Cancer Society: Can assist with transportation, wigs, general needs, runs Look Good Feel Better.        1-888-227-6333 ? Cancer Care: Provides financial assistance, online support groups, medication/co-pay  assistance.  1-800-813-HOPE (4673) ? [facility] Assists [place] cancer patients and their families through emotional , educational and financial support.  336-427-4357 ? [place] DSS Where to apply for food stamps, Medicaid and utility assistance. 336-342-1394 ? [facility]: Transportation to medical appointments. 336-347-2287 ? Social Security Administration: May apply for disability if have a Stage IV cancer. 336-342-7796 1-800-772-1213 ? [place] Aging, Disability and Transit Services: Assists with nutrition, care and transit needs. 336-349-2343         

## 2017-03-18 ENCOUNTER — Inpatient Hospital Stay (HOSPITAL_COMMUNITY): Payer: Medicare Other

## 2017-03-18 ENCOUNTER — Other Ambulatory Visit: Payer: Self-pay

## 2017-03-18 ENCOUNTER — Ambulatory Visit (HOSPITAL_COMMUNITY): Payer: Medicare Other | Admitting: Internal Medicine

## 2017-03-18 ENCOUNTER — Encounter (HOSPITAL_COMMUNITY): Payer: Self-pay

## 2017-03-18 VITALS — BP 140/56 | HR 58 | Temp 97.6°F | Resp 18 | Wt 244.6 lb

## 2017-03-18 DIAGNOSIS — Z5112 Encounter for antineoplastic immunotherapy: Secondary | ICD-10-CM | POA: Diagnosis not present

## 2017-03-18 DIAGNOSIS — C9000 Multiple myeloma not having achieved remission: Secondary | ICD-10-CM

## 2017-03-18 DIAGNOSIS — C9002 Multiple myeloma in relapse: Secondary | ICD-10-CM

## 2017-03-18 LAB — CBC WITH DIFFERENTIAL/PLATELET
Basophils Absolute: 0 10*3/uL (ref 0.0–0.1)
Basophils Relative: 0 %
Eosinophils Absolute: 0 10*3/uL (ref 0.0–0.7)
Eosinophils Relative: 0 %
HCT: 42 % (ref 39.0–52.0)
Hemoglobin: 13.6 g/dL (ref 13.0–17.0)
Lymphocytes Relative: 22 %
Lymphs Abs: 1 10*3/uL (ref 0.7–4.0)
MCH: 30.6 pg (ref 26.0–34.0)
MCHC: 32.4 g/dL (ref 30.0–36.0)
MCV: 94.6 fL (ref 78.0–100.0)
Monocytes Absolute: 0.5 10*3/uL (ref 0.1–1.0)
Monocytes Relative: 11 %
Neutro Abs: 3 10*3/uL (ref 1.7–7.7)
Neutrophils Relative %: 67 %
Platelets: 118 10*3/uL — ABNORMAL LOW (ref 150–400)
RBC: 4.44 MIL/uL (ref 4.22–5.81)
RDW: 14 % (ref 11.5–15.5)
WBC: 4.5 10*3/uL (ref 4.0–10.5)

## 2017-03-18 MED ORDER — PROCHLORPERAZINE MALEATE 10 MG PO TABS
ORAL_TABLET | ORAL | Status: AC
  Filled 2017-03-18: qty 1

## 2017-03-18 MED ORDER — PROCHLORPERAZINE MALEATE 10 MG PO TABS
10.0000 mg | ORAL_TABLET | Freq: Once | ORAL | Status: AC
  Administered 2017-03-18: 10 mg via ORAL

## 2017-03-18 MED ORDER — BORTEZOMIB CHEMO SQ INJECTION 3.5 MG (2.5MG/ML)
1.3000 mg/m2 | Freq: Once | INTRAMUSCULAR | Status: AC
  Administered 2017-03-18: 3 mg via SUBCUTANEOUS
  Filled 2017-03-18: qty 3

## 2017-03-18 NOTE — Progress Notes (Signed)
Dean [name]. presents today for injection per the provider's orders.  Velcade administration without incident; see MAR for injection details.  Patient tolerated procedure well and without incident.  No questions or complaints noted at this time.  Discharged ambulatory.

## 2017-03-25 ENCOUNTER — Encounter (HOSPITAL_COMMUNITY): Payer: Self-pay

## 2017-03-25 ENCOUNTER — Inpatient Hospital Stay (HOSPITAL_COMMUNITY): Payer: Medicare Other

## 2017-03-25 ENCOUNTER — Inpatient Hospital Stay (HOSPITAL_COMMUNITY): Payer: Medicare Other | Attending: Oncology

## 2017-03-25 ENCOUNTER — Other Ambulatory Visit: Payer: Self-pay

## 2017-03-25 VITALS — BP 132/59 | HR 64 | Temp 97.5°F | Resp 18 | Wt 245.0 lb

## 2017-03-25 DIAGNOSIS — Z5112 Encounter for antineoplastic immunotherapy: Secondary | ICD-10-CM | POA: Insufficient documentation

## 2017-03-25 DIAGNOSIS — C9002 Multiple myeloma in relapse: Secondary | ICD-10-CM | POA: Diagnosis not present

## 2017-03-25 DIAGNOSIS — C9000 Multiple myeloma not having achieved remission: Secondary | ICD-10-CM

## 2017-03-25 LAB — CBC WITH DIFFERENTIAL/PLATELET
Basophils Absolute: 0 10*3/uL (ref 0.0–0.1)
Basophils Relative: 0 %
Eosinophils Absolute: 0 10*3/uL (ref 0.0–0.7)
Eosinophils Relative: 0 %
HCT: 41.2 % (ref 39.0–52.0)
Hemoglobin: 13.5 g/dL (ref 13.0–17.0)
Lymphocytes Relative: 17 %
Lymphs Abs: 0.9 10*3/uL (ref 0.7–4.0)
MCH: 30.9 pg (ref 26.0–34.0)
MCHC: 32.8 g/dL (ref 30.0–36.0)
MCV: 94.3 fL (ref 78.0–100.0)
Monocytes Absolute: 0.7 10*3/uL (ref 0.1–1.0)
Monocytes Relative: 15 %
Neutro Abs: 3.4 10*3/uL (ref 1.7–7.7)
Neutrophils Relative %: 68 %
Platelets: 98 10*3/uL — ABNORMAL LOW (ref 150–400)
RBC: 4.37 MIL/uL (ref 4.22–5.81)
RDW: 13.8 % (ref 11.5–15.5)
WBC: 5 10*3/uL (ref 4.0–10.5)

## 2017-03-25 LAB — COMPREHENSIVE METABOLIC PANEL
ALT: 15 U/L — ABNORMAL LOW (ref 17–63)
AST: 21 U/L (ref 15–41)
Albumin: 3.9 g/dL (ref 3.5–5.0)
Alkaline Phosphatase: 28 U/L — ABNORMAL LOW (ref 38–126)
Anion gap: 8 (ref 5–15)
BUN: 26 mg/dL — ABNORMAL HIGH (ref 6–20)
CO2: 24 mmol/L (ref 22–32)
Calcium: 9.2 mg/dL (ref 8.9–10.3)
Chloride: 105 mmol/L (ref 101–111)
Creatinine, Ser: 1.02 mg/dL (ref 0.61–1.24)
GFR calc Af Amer: >60 mL/min (ref >60)
GFR calc non Af Amer: >60 mL/min (ref >60)
Glucose, Bld: 96 mg/dL (ref 65–99)
Potassium: 4.2 mmol/L (ref 3.5–5.1)
Sodium: 137 mmol/L (ref 135–145)
Total Bilirubin: 0.5 mg/dL (ref 0.3–1.2)
Total Protein: 6.6 g/dL (ref 6.5–8.1)

## 2017-03-25 MED ORDER — PROCHLORPERAZINE MALEATE 10 MG PO TABS
10.0000 mg | ORAL_TABLET | Freq: Once | ORAL | Status: AC
  Administered 2017-03-25: 10 mg via ORAL

## 2017-03-25 MED ORDER — PROCHLORPERAZINE MALEATE 10 MG PO TABS
ORAL_TABLET | ORAL | Status: AC
  Filled 2017-03-25: qty 1

## 2017-03-25 MED ORDER — BORTEZOMIB CHEMO SQ INJECTION 3.5 MG (2.5MG/ML)
1.3000 mg/m2 | Freq: Once | INTRAMUSCULAR | Status: AC
  Administered 2017-03-25: 3 mg via SUBCUTANEOUS
  Filled 2017-03-25: qty 3

## 2017-03-25 NOTE — Progress Notes (Signed)
Labs reviewed with Dean Craze NP, proceed with treatment. Platelets 98,000.   Dean [name]. presents today for injection per MD orders. Velcade 3 mg administered SQ in right Abdomen. Administration without incident. Patient tolerated well.  Treatment given per orders. Patient tolerated it well without problems. Vitals stable and discharged home from clinic ambulatory. Follow up as scheduled.

## 2017-03-25 NOTE — Patient Instructions (Signed)
[facility] Discharge Instructions for Patients Receiving Chemotherapy   Beginning January 23rd 2017 lab work for the Cancer Center will be done in the  Main lab at [facility] on 1st floor. If you have a lab appointment with the Cancer Center please come in thru the  Main Entrance and check in at the main information desk   Today you received the following chemotherapy agents   To help prevent nausea and vomiting after your treatment, we encourage you to take your nausea medication     If you develop nausea and vomiting, or diarrhea that is not controlled by your medication, call the clinic.  The clinic phone number is [phone]. Office hours are Monday-Friday 8:30am-5:00pm.  BELOW ARE SYMPTOMS THAT SHOULD BE REPORTED IMMEDIATELY:  *FEVER GREATER THAN 101.0 F  *CHILLS WITH OR WITHOUT FEVER  NAUSEA AND VOMITING THAT IS NOT CONTROLLED WITH YOUR NAUSEA MEDICATION  *UNUSUAL SHORTNESS OF BREATH  *UNUSUAL BRUISING OR BLEEDING  TENDERNESS IN MOUTH AND THROAT WITH OR WITHOUT PRESENCE OF ULCERS  *URINARY PROBLEMS  *BOWEL PROBLEMS  UNUSUAL RASH Items with * indicate a potential emergency and should be followed up as soon as possible. If you have an emergency after office hours please contact your primary care physician or go to the nearest emergency department.  Please call the clinic during office hours if you have any questions or concerns.   You may also contact the Patient Navigator at [phone] should you have any questions or need assistance in obtaining follow up care.      Resources For Cancer Patients and their Caregivers ? American Cancer Society: Can assist with transportation, wigs, general needs, runs Look Good Feel Better.        1-888-227-6333 ? Cancer Care: Provides financial assistance, online support groups, medication/co-pay assistance.  1-800-813-HOPE (4673) ? [facility] Assists [place] cancer  patients and their families through emotional , educational and financial support.  336-427-4357 ? [place] DSS Where to apply for food stamps, Medicaid and utility assistance. 336-342-1394 ? [facility]: Transportation to medical appointments. 336-347-2287 ? Social Security Administration: May apply for disability if have a Stage IV cancer. 336-342-7796 1-800-772-1213 ? [place] Aging, Disability and Transit Services: Assists with nutrition, care and transit needs. 336-349-2343         

## 2017-03-26 ENCOUNTER — Other Ambulatory Visit (HOSPITAL_COMMUNITY): Payer: Self-pay | Admitting: Adult Health

## 2017-03-26 DIAGNOSIS — G629 Polyneuropathy, unspecified: Secondary | ICD-10-CM

## 2017-03-26 DIAGNOSIS — C9000 Multiple myeloma not having achieved remission: Secondary | ICD-10-CM

## 2017-04-01 ENCOUNTER — Other Ambulatory Visit: Payer: Self-pay

## 2017-04-01 ENCOUNTER — Inpatient Hospital Stay (HOSPITAL_COMMUNITY): Payer: Medicare Other

## 2017-04-01 ENCOUNTER — Encounter (HOSPITAL_COMMUNITY): Payer: Self-pay

## 2017-04-01 VITALS — BP 127/62 | HR 64 | Temp 97.5°F | Resp 18 | Wt 242.2 lb

## 2017-04-01 DIAGNOSIS — C9000 Multiple myeloma not having achieved remission: Secondary | ICD-10-CM

## 2017-04-01 DIAGNOSIS — C9002 Multiple myeloma in relapse: Secondary | ICD-10-CM | POA: Diagnosis not present

## 2017-04-01 DIAGNOSIS — Z5112 Encounter for antineoplastic immunotherapy: Secondary | ICD-10-CM | POA: Diagnosis not present

## 2017-04-01 LAB — COMPREHENSIVE METABOLIC PANEL
ALT: 13 U/L — ABNORMAL LOW (ref 17–63)
AST: 22 U/L (ref 15–41)
Albumin: 4.1 g/dL (ref 3.5–5.0)
Alkaline Phosphatase: 24 U/L — ABNORMAL LOW (ref 38–126)
Anion gap: 12 (ref 5–15)
BUN: 23 mg/dL — ABNORMAL HIGH (ref 6–20)
CO2: 22 mmol/L (ref 22–32)
Calcium: 9.5 mg/dL (ref 8.9–10.3)
Chloride: 106 mmol/L (ref 101–111)
Creatinine, Ser: 0.96 mg/dL (ref 0.61–1.24)
GFR calc Af Amer: >60 mL/min (ref >60)
GFR calc non Af Amer: >60 mL/min (ref >60)
Glucose, Bld: 99 mg/dL (ref 65–99)
Potassium: 4.2 mmol/L (ref 3.5–5.1)
Sodium: 140 mmol/L (ref 135–145)
Total Bilirubin: 0.5 mg/dL (ref 0.3–1.2)
Total Protein: 6.7 g/dL (ref 6.5–8.1)

## 2017-04-01 LAB — CBC WITH DIFFERENTIAL/PLATELET
Basophils Absolute: 0 10*3/uL (ref 0.0–0.1)
Basophils Relative: 0 %
Eosinophils Absolute: 0 10*3/uL (ref 0.0–0.7)
Eosinophils Relative: 0 %
HCT: 42.1 % (ref 39.0–52.0)
Hemoglobin: 13.8 g/dL (ref 13.0–17.0)
Lymphocytes Relative: 8 %
Lymphs Abs: 0.8 10*3/uL (ref 0.7–4.0)
MCH: 31 pg (ref 26.0–34.0)
MCHC: 32.8 g/dL (ref 30.0–36.0)
MCV: 94.6 fL (ref 78.0–100.0)
Monocytes Absolute: 1.1 10*3/uL — ABNORMAL HIGH (ref 0.1–1.0)
Monocytes Relative: 11 %
Neutro Abs: 7.6 10*3/uL (ref 1.7–7.7)
Neutrophils Relative %: 81 %
Platelets: 105 10*3/uL — ABNORMAL LOW (ref 150–400)
RBC: 4.45 MIL/uL (ref 4.22–5.81)
RDW: 14.1 % (ref 11.5–15.5)
WBC: 9.4 10*3/uL (ref 4.0–10.5)

## 2017-04-01 LAB — LACTATE DEHYDROGENASE: LDH: 146 U/L (ref 98–192)

## 2017-04-01 MED ORDER — BORTEZOMIB CHEMO SQ INJECTION 3.5 MG (2.5MG/ML)
1.3000 mg/m2 | Freq: Once | INTRAMUSCULAR | Status: AC
  Administered 2017-04-01: 3 mg via SUBCUTANEOUS
  Filled 2017-04-01: qty 3

## 2017-04-01 MED ORDER — PROCHLORPERAZINE MALEATE 10 MG PO TABS
10.0000 mg | ORAL_TABLET | Freq: Once | ORAL | Status: AC
  Administered 2017-04-01: 10 mg via ORAL

## 2017-04-01 MED ORDER — PROCHLORPERAZINE MALEATE 10 MG PO TABS
ORAL_TABLET | ORAL | Status: AC
  Filled 2017-04-01: qty 1

## 2017-04-01 NOTE — Progress Notes (Signed)
Labs reviewed with MD. Proceed with treatment.  Dean [name]. presents today for injection per MD orders. Velcade 3.mg administered SQ in left Abdomen. Administration without incident. Patient tolerated well.    Treatment given per orders. Patient tolerated it well without problems. Vitals stable and discharged home from clinic ambulatory. Follow up as scheduled.

## 2017-04-01 NOTE — Patient Instructions (Signed)
[facility] Discharge Instructions for Patients Receiving Chemotherapy   Beginning January 23rd 2017 lab work for the Cancer Center will be done in the  Main lab at [place] on 1st floor. If you have a lab appointment with the Cancer Center please come in thru the  Main Entrance and check in at the main information desk   Today you received the following chemotherapy agents   To help prevent nausea and vomiting after your treatment, we encourage you to take your nausea medication     If you develop nausea and vomiting, or diarrhea that is not controlled by your medication, call the clinic.  The clinic phone number is [phone]. Office hours are Monday-Friday 8:30am-5:00pm.  BELOW ARE SYMPTOMS THAT SHOULD BE REPORTED IMMEDIATELY:  *FEVER GREATER THAN 101.0 F  *CHILLS WITH OR WITHOUT FEVER  NAUSEA AND VOMITING THAT IS NOT CONTROLLED WITH YOUR NAUSEA MEDICATION  *UNUSUAL SHORTNESS OF BREATH  *UNUSUAL BRUISING OR BLEEDING  TENDERNESS IN MOUTH AND THROAT WITH OR WITHOUT PRESENCE OF ULCERS  *URINARY PROBLEMS  *BOWEL PROBLEMS  UNUSUAL RASH Items with * indicate a potential emergency and should be followed up as soon as possible. If you have an emergency after office hours please contact your primary care physician or go to the nearest emergency department.  Please call the clinic during office hours if you have any questions or concerns.   You may also contact the Patient Navigator at [phone] should you have any questions or need assistance in obtaining follow up care.      Resources For Cancer Patients and their Caregivers ? American Cancer Society: Can assist with transportation, wigs, general needs, runs Look Good Feel Better.        1-888-227-6333 ? Cancer Care: Provides financial assistance, online support groups, medication/co-pay assistance.  1-800-813-HOPE (4673) ? [facility] Assists [place] cancer  patients and their families through emotional , educational and financial support.  336-427-4357 ? [place] DSS Where to apply for food stamps, Medicaid and utility assistance. 336-342-1394 ? RCATS: Transportation to medical appointments. 336-347-2287 ? Social Security Administration: May apply for disability if have a Stage IV cancer. 336-342-7796 1-800-772-1213 ? [place] Aging, Disability and Transit Services: Assists with nutrition, care and transit needs. 336-349-2343         

## 2017-04-02 LAB — IGG, IGA, IGM
IgA: 77 mg/dL (ref 61–437)
IgG (Immunoglobin G), Serum: 475 mg/dL — ABNORMAL LOW (ref 700–1600)
IgM (Immunoglobulin M), Srm: 30 mg/dL (ref 15–143)

## 2017-04-02 LAB — PROTEIN ELECTROPHORESIS, SERUM
A/G Ratio: 1.4 (ref 0.7–1.7)
Albumin ELP: 3.6 g/dL (ref 2.9–4.4)
Alpha-1-Globulin: 0.2 g/dL (ref 0.0–0.4)
Alpha-2-Globulin: 0.8 g/dL (ref 0.4–1.0)
Beta Globulin: 1 g/dL (ref 0.7–1.3)
Gamma Globulin: 0.5 g/dL (ref 0.4–1.8)
Globulin, Total: 2.6 g/dL (ref 2.2–3.9)
M-Spike, %: 0.2 g/dL — ABNORMAL HIGH
Total Protein ELP: 6.2 g/dL (ref 6.0–8.5)

## 2017-04-02 LAB — BETA 2 MICROGLOBULIN, SERUM: Beta-2 Microglobulin: 1.5 mg/L (ref 0.6–2.4)

## 2017-04-08 ENCOUNTER — Inpatient Hospital Stay (HOSPITAL_BASED_OUTPATIENT_CLINIC_OR_DEPARTMENT_OTHER): Payer: Medicare Other | Admitting: Internal Medicine

## 2017-04-08 ENCOUNTER — Encounter (HOSPITAL_COMMUNITY): Payer: Self-pay | Admitting: Internal Medicine

## 2017-04-08 ENCOUNTER — Inpatient Hospital Stay (HOSPITAL_COMMUNITY): Payer: Medicare Other | Attending: Internal Medicine

## 2017-04-08 ENCOUNTER — Inpatient Hospital Stay (HOSPITAL_COMMUNITY): Payer: Medicare Other

## 2017-04-08 ENCOUNTER — Other Ambulatory Visit: Payer: Self-pay

## 2017-04-08 VITALS — BP 136/54 | HR 64 | Temp 97.5°F | Resp 20 | Wt 243.2 lb

## 2017-04-08 DIAGNOSIS — D696 Thrombocytopenia, unspecified: Secondary | ICD-10-CM

## 2017-04-08 DIAGNOSIS — M2550 Pain in unspecified joint: Secondary | ICD-10-CM | POA: Diagnosis not present

## 2017-04-08 DIAGNOSIS — C9000 Multiple myeloma not having achieved remission: Secondary | ICD-10-CM | POA: Diagnosis not present

## 2017-04-08 DIAGNOSIS — Z5112 Encounter for antineoplastic immunotherapy: Secondary | ICD-10-CM | POA: Insufficient documentation

## 2017-04-08 LAB — COMPREHENSIVE METABOLIC PANEL
ALT: 15 U/L — ABNORMAL LOW (ref 17–63)
AST: 20 U/L (ref 15–41)
Albumin: 3.9 g/dL (ref 3.5–5.0)
Alkaline Phosphatase: 31 U/L — ABNORMAL LOW (ref 38–126)
Anion gap: 11 (ref 5–15)
BUN: 27 mg/dL — ABNORMAL HIGH (ref 6–20)
CO2: 25 mmol/L (ref 22–32)
Calcium: 9.2 mg/dL (ref 8.9–10.3)
Chloride: 102 mmol/L (ref 101–111)
Creatinine, Ser: 1.1 mg/dL (ref 0.61–1.24)
GFR calc Af Amer: >60 mL/min (ref >60)
GFR calc non Af Amer: >60 mL/min (ref >60)
Glucose, Bld: 103 mg/dL — ABNORMAL HIGH (ref 65–99)
Potassium: 4.6 mmol/L (ref 3.5–5.1)
Sodium: 138 mmol/L (ref 135–145)
Total Bilirubin: 0.7 mg/dL (ref 0.3–1.2)
Total Protein: 6.5 g/dL (ref 6.5–8.1)

## 2017-04-08 LAB — CBC WITH DIFFERENTIAL/PLATELET
Basophils Absolute: 0 10*3/uL (ref 0.0–0.1)
Basophils Relative: 0 %
Eosinophils Absolute: 0 10*3/uL (ref 0.0–0.7)
Eosinophils Relative: 0 %
HCT: 41.5 % (ref 39.0–52.0)
Hemoglobin: 13.7 g/dL (ref 13.0–17.0)
Lymphocytes Relative: 7 %
Lymphs Abs: 0.6 10*3/uL — ABNORMAL LOW (ref 0.7–4.0)
MCH: 31.3 pg (ref 26.0–34.0)
MCHC: 33 g/dL (ref 30.0–36.0)
MCV: 94.7 fL (ref 78.0–100.0)
Monocytes Absolute: 0.9 10*3/uL (ref 0.1–1.0)
Monocytes Relative: 9 %
Neutro Abs: 8 10*3/uL — ABNORMAL HIGH (ref 1.7–7.7)
Neutrophils Relative %: 84 %
Platelets: 105 10*3/uL — ABNORMAL LOW (ref 150–400)
RBC: 4.38 MIL/uL (ref 4.22–5.81)
RDW: 14.1 % (ref 11.5–15.5)
WBC: 9.5 10*3/uL (ref 4.0–10.5)

## 2017-04-08 LAB — LACTATE DEHYDROGENASE: LDH: 138 U/L (ref 98–192)

## 2017-04-08 MED ORDER — ZOLEDRONIC ACID 4 MG/100ML IV SOLN
4.0000 mg | Freq: Once | INTRAVENOUS | Status: AC
  Administered 2017-04-08: 4 mg via INTRAVENOUS
  Filled 2017-04-08: qty 100

## 2017-04-08 MED ORDER — SODIUM CHLORIDE 0.9 % IV SOLN
INTRAVENOUS | Status: DC
  Administered 2017-04-08: 13:00:00 via INTRAVENOUS

## 2017-04-08 MED ORDER — BORTEZOMIB CHEMO SQ INJECTION 3.5 MG (2.5MG/ML)
1.3000 mg/m2 | Freq: Once | INTRAMUSCULAR | Status: AC
  Administered 2017-04-08: 3 mg via SUBCUTANEOUS
  Filled 2017-04-08: qty 3

## 2017-04-08 MED ORDER — PROCHLORPERAZINE MALEATE 10 MG PO TABS
10.0000 mg | ORAL_TABLET | Freq: Once | ORAL | Status: AC
  Administered 2017-04-08: 10 mg via ORAL

## 2017-04-08 MED ORDER — PROCHLORPERAZINE MALEATE 10 MG PO TABS
ORAL_TABLET | ORAL | Status: AC
  Filled 2017-04-08: qty 1

## 2017-04-08 MED ORDER — SODIUM CHLORIDE 0.9 % IJ SOLN: 3.0000 mL | Freq: Once | INTRAMUSCULAR | Status: DC | PRN

## 2017-04-08 NOTE — Progress Notes (Signed)
Patient to receive velcade and zometa today per Dr. Walden Field.

## 2017-04-08 NOTE — Patient Instructions (Addendum)
[place] at [facility] Discharge Instructions   You were seen today by Dr. Zoila Shutter. Dr. Walden Field went over your lab results, labs are good at this time. Dr. Walden Field discussed maintenance medications and therapy. She discussed Velcade and how you feel. She is going to look over your medications and look into maybe switching your medications. She will call you within the next week or so to discuss things with you further. Continue taking tylenol or extra strength tylenol for your headaches. Try taking Melatonin or zquil to help you sleep. Continue taking your gabapentin for your leg pain. Return in 1 month for follow up and labs.      Thank you for choosing [place] at [facility] to provide your oncology and hematology care.  To afford each patient quality time with our provider, please arrive at least 15 minutes before your scheduled appointment time.    If you have a lab appointment with the [place] please come in thru the  Main Entrance and check in at the main information desk  You need to re-schedule your appointment should you arrive 10 or more minutes late.  We strive to give you quality time with our providers, and arriving late affects you and other patients whose appointments are after yours.  Also, if you no show three or more times for appointments you may be dismissed from the clinic at the providers discretion.     Again, thank you for choosing [facility].  Our hope is that these requests will decrease the amount of time that you wait before being seen by our physicians.       _____________________________________________________________  Should you have questions after your visit to [facility], please contact our office at [phone] between the hours of 8:30 a.m. and 4:30 p.m.  Voicemails left after 4:30 p.m. will not be returned until the following business day.  For prescription refill  requests, have your pharmacy contact our office.       Resources For Cancer Patients and their Caregivers ? American Cancer Society: Can assist with transportation, wigs, general needs, runs Look Good Feel Better.        731 882 6845 ? Cancer Care: Provides financial assistance, online support groups, medication/co-pay assistance.  1-800-813-HOPE (901) 001-4770) ? [facility] Assists [place] cancer patients and their families through emotional , educational and financial support.  (567)725-4589 ? [place] DSS Where to apply for food stamps, Medicaid and utility assistance. 435-320-4131 ? RCATS: Transportation to medical appointments. (510)824-9781 ? Social Security Administration: May apply for disability if have a Stage IV cancer. 604-875-1379 787-776-7730 ? [employer], Disability and Transit Services: Assists with nutrition, care and transit needs. [place] Support Programs:   > Cancer Support Group  2nd Tuesday of the month 1pm-2pm, Journey Room   > Creative Journey  3rd Tuesday of the month 1130am-1pm, Journey Room

## 2017-04-08 NOTE — Progress Notes (Signed)
Seen by Dr. Walden Field today. Ok to proceed with Velcade and zometa today.

## 2017-04-08 NOTE — Patient Instructions (Signed)
[facility] Discharge Instructions for Patients Receiving Chemotherapy   Beginning January 23rd 2017 lab work for the Cancer Center will be done in the  Main lab at [place] on 1st floor. If you have a lab appointment with the Cancer Center please come in thru the  Main Entrance and check in at the main information desk   Today you received the following chemotherapy agents   To help prevent nausea and vomiting after your treatment, we encourage you to take your nausea medication     If you develop nausea and vomiting, or diarrhea that is not controlled by your medication, call the clinic.  The clinic phone number is [phone]. Office hours are Monday-Friday 8:30am-5:00pm.  BELOW ARE SYMPTOMS THAT SHOULD BE REPORTED IMMEDIATELY:  *FEVER GREATER THAN 101.0 F  *CHILLS WITH OR WITHOUT FEVER  NAUSEA AND VOMITING THAT IS NOT CONTROLLED WITH YOUR NAUSEA MEDICATION  *UNUSUAL SHORTNESS OF BREATH  *UNUSUAL BRUISING OR BLEEDING  TENDERNESS IN MOUTH AND THROAT WITH OR WITHOUT PRESENCE OF ULCERS  *URINARY PROBLEMS  *BOWEL PROBLEMS  UNUSUAL RASH Items with * indicate a potential emergency and should be followed up as soon as possible. If you have an emergency after office hours please contact your primary care physician or go to the nearest emergency department.  Please call the clinic during office hours if you have any questions or concerns.   You may also contact the Patient Navigator at [phone] should you have any questions or need assistance in obtaining follow up care.      Resources For Cancer Patients and their Caregivers ? American Cancer Society: Can assist with transportation, wigs, general needs, runs Look Good Feel Better.        1-888-227-6333 ? Cancer Care: Provides financial assistance, online support groups, medication/co-pay assistance.  1-800-813-HOPE (4673) ? [facility] Assists [place] cancer  patients and their families through emotional , educational and financial support.  336-427-4357 ? [place] DSS Where to apply for food stamps, Medicaid and utility assistance. 336-342-1394 ? RCATS: Transportation to medical appointments. 336-347-2287 ? Social Security Administration: May apply for disability if have a Stage IV cancer. 336-342-7796 1-800-772-1213 ? [place] Aging, Disability and Transit Services: Assists with nutrition, care and transit needs. 336-349-2343         

## 2017-04-15 ENCOUNTER — Inpatient Hospital Stay (HOSPITAL_COMMUNITY): Payer: Medicare Other

## 2017-04-15 DIAGNOSIS — C9000 Multiple myeloma not having achieved remission: Secondary | ICD-10-CM

## 2017-04-15 DIAGNOSIS — Z5112 Encounter for antineoplastic immunotherapy: Secondary | ICD-10-CM | POA: Diagnosis not present

## 2017-04-15 LAB — CBC WITH DIFFERENTIAL/PLATELET
Basophils Absolute: 0 10*3/uL (ref 0.0–0.1)
Basophils Relative: 0 %
Eosinophils Absolute: 0 10*3/uL (ref 0.0–0.7)
Eosinophils Relative: 0 %
HCT: 40 % (ref 39.0–52.0)
Hemoglobin: 12.9 g/dL — ABNORMAL LOW (ref 13.0–17.0)
Lymphocytes Relative: 15 %
Lymphs Abs: 0.6 10*3/uL — ABNORMAL LOW (ref 0.7–4.0)
MCH: 30.9 pg (ref 26.0–34.0)
MCHC: 32.3 g/dL (ref 30.0–36.0)
MCV: 95.9 fL (ref 78.0–100.0)
Monocytes Absolute: 0.6 10*3/uL (ref 0.1–1.0)
Monocytes Relative: 14 %
Neutro Abs: 2.8 10*3/uL (ref 1.7–7.7)
Neutrophils Relative %: 71 %
Platelets: 72 10*3/uL — ABNORMAL LOW (ref 150–400)
RBC: 4.17 MIL/uL — ABNORMAL LOW (ref 4.22–5.81)
RDW: 14.5 % (ref 11.5–15.5)
WBC: 4 10*3/uL (ref 4.0–10.5)

## 2017-04-15 LAB — COMPREHENSIVE METABOLIC PANEL
ALT: 16 U/L — ABNORMAL LOW (ref 17–63)
AST: 17 U/L (ref 15–41)
Albumin: 3.4 g/dL — ABNORMAL LOW (ref 3.5–5.0)
Alkaline Phosphatase: 30 U/L — ABNORMAL LOW (ref 38–126)
Anion gap: 9 (ref 5–15)
BUN: 23 mg/dL — ABNORMAL HIGH (ref 6–20)
CO2: 23 mmol/L (ref 22–32)
Calcium: 8.6 mg/dL — ABNORMAL LOW (ref 8.9–10.3)
Chloride: 105 mmol/L (ref 101–111)
Creatinine, Ser: 0.88 mg/dL (ref 0.61–1.24)
GFR calc Af Amer: >60 mL/min (ref >60)
GFR calc non Af Amer: >60 mL/min (ref >60)
Glucose, Bld: 90 mg/dL (ref 65–99)
Potassium: 4.2 mmol/L (ref 3.5–5.1)
Sodium: 137 mmol/L (ref 135–145)
Total Bilirubin: 0.5 mg/dL (ref 0.3–1.2)
Total Protein: 5.6 g/dL — ABNORMAL LOW (ref 6.5–8.1)

## 2017-04-15 LAB — LACTATE DEHYDROGENASE: LDH: 136 U/L (ref 98–192)

## 2017-04-15 NOTE — Progress Notes (Signed)
Spoke with Dr. Walden Field to follow up on her conversation that she had with patient at prior visit. Labs reviewed and per MD, she is going to follow up with Dr. Norma Fredrickson to see what other medications he could possibly switch to. Dr. Walden Field had told patient that she would personally call him to let him know after she talked to Dr. Norma Fredrickson.   No velcade today or until Dr. Walden Field speaks with Dr. Norma Fredrickson.  Patient is aware and understands the plan.   I will personally follow up with patient on Monday the 18th to check on things.   Discharged patient from clinic today ambulatory with wife.

## 2017-04-15 NOTE — Patient Instructions (Signed)
[place] at [facility] Discharge Instructions  No velcade today . Follow up as scheduled.   Thank you for choosing [place] at [facility] to provide your oncology and hematology care.  To afford each patient quality time with our provider, please arrive at least 15 minutes before your scheduled appointment time.   If you have a lab appointment with the [place] please come in thru the  Main Entrance and check in at the main information desk  You need to re-schedule your appointment should you arrive 10 or more minutes late.  We strive to give you quality time with our providers, and arriving late affects you and other patients whose appointments are after yours.  Also, if you no show three or more times for appointments you may be dismissed from the clinic at the providers discretion.     Again, thank you for choosing [facility].  Our hope is that these requests will decrease the amount of time that you wait before being seen by our physicians.       _____________________________________________________________  Should you have questions after your visit to [facility], please contact our office at [phone] between the hours of 8:30 a.m. and 4:30 p.m.  Voicemails left after 4:30 p.m. will not be returned until the following business day.  For prescription refill requests, have your pharmacy contact our office.       Resources For Cancer Patients and their Caregivers ? American Cancer Society: Can assist with transportation, wigs, general needs, runs Look Good Feel Better.        (770) 677-7992 ? Cancer Care: Provides financial assistance, online support groups, medication/co-pay assistance.  1-800-813-HOPE 484-186-9428) ? [facility] Assists [place] cancer patients and their families through emotional , educational and financial support.  (331)171-6791 ? [place]  DSS Where to apply for food stamps, Medicaid and utility assistance. (936)714-0940 ? RCATS: Transportation to medical appointments. 959-251-6295 ? Social Security Administration: May apply for disability if have a Stage IV cancer. 7125308438 (878) 250-9294 ? [employer], Disability and Transit Services: Assists with nutrition, care and transit needs. [place] Support Programs:   > Cancer Support Group  2nd Tuesday of the month 1pm-2pm, Journey Room   > Creative Journey  3rd Tuesday of the month 1130am-1pm, Journey Room

## 2017-04-17 ENCOUNTER — Encounter (HOSPITAL_COMMUNITY): Payer: Self-pay | Admitting: *Deleted

## 2017-04-17 DIAGNOSIS — C9000 Multiple myeloma not having achieved remission: Secondary | ICD-10-CM

## 2017-04-17 NOTE — Progress Notes (Signed)
Dr. Walden Field spoke with Dr. Norma Fredrickson at [facility] and discussed pt's current medications and lab results. Dr. Norma Fredrickson suggested that the pt stop the Velcade and modify his medication regimen after the pt's labs a rechecked in 3-4 weeks.  Dr. Walden Field spoke with the pt and explained that he would need to stop his velcade at this time and have his labs rechecked in 3-4 weeks. The pt verbalized understanding.  I sent a message to Amy our scheduler to notify her of the pt's schedule change and put the orders in and linked them.

## 2017-04-21 ENCOUNTER — Telehealth: Payer: Self-pay | Admitting: Oncology

## 2017-04-21 ENCOUNTER — Telehealth: Payer: Self-pay | Admitting: *Deleted

## 2017-04-21 ENCOUNTER — Telehealth (HOSPITAL_COMMUNITY): Payer: Self-pay

## 2017-04-21 NOTE — Telephone Encounter (Signed)
Routine filling would likely be okay. If he needs any invasive procedures (like extractions), then we would need to give additional recommendations.    Mike Craze, NP [place] [phone]

## 2017-04-21 NOTE — Telephone Encounter (Signed)
Notified patient of the recommendations and that as long as it was routine cleaning and filling that is okay. Patient verbalized understanding.

## 2017-04-21 NOTE — Telephone Encounter (Signed)
Patient left a message saying that he missed his follow up with Dr. Sondra Come and would like to reschedule.  Romie Jumper, Medical Secretary will call the patient back.

## 2017-04-21 NOTE — Telephone Encounter (Signed)
CALLED PATIENT TO ASK ABOUT SCHEDULING FU APPT., APPT. SCHEDULED FOR 04-23-17 @ 10 AM, SPOKE WITH PATIENT AND HE IS AWARE OF THIS APPT.

## 2017-04-21 NOTE — Telephone Encounter (Signed)
Patient called stating he is scheduled to see the dentist for teeth cleaning in April. He states he had to put this off because of radiation. He wants to know if any of his teeth need filling, is it okay to go ahead and get them filled?

## 2017-04-22 ENCOUNTER — Ambulatory Visit (HOSPITAL_COMMUNITY): Payer: Medicare Other

## 2017-04-22 ENCOUNTER — Encounter: Payer: Self-pay | Admitting: Oncology

## 2017-04-22 ENCOUNTER — Other Ambulatory Visit (HOSPITAL_COMMUNITY): Payer: Medicare Other

## 2017-04-23 ENCOUNTER — Encounter: Payer: Self-pay | Admitting: Radiation Oncology

## 2017-04-23 ENCOUNTER — Ambulatory Visit
Admission: RE | Admit: 2017-04-23 | Discharge: 2017-04-23 | Disposition: A | Discharge status: Home / Self Care | Payer: Medicare Other | Source: Ambulatory Visit | Attending: Radiation Oncology | Admitting: Radiation Oncology

## 2017-04-23 ENCOUNTER — Other Ambulatory Visit: Payer: Self-pay

## 2017-04-23 VITALS — BP 139/75 | HR 76 | Temp 97.6°F | Resp 16 | Wt 247.0 lb

## 2017-04-23 DIAGNOSIS — Z885 Allergy status to narcotic agent status: Secondary | ICD-10-CM | POA: Diagnosis not present

## 2017-04-23 DIAGNOSIS — Z7982 Long term (current) use of aspirin: Secondary | ICD-10-CM | POA: Insufficient documentation

## 2017-04-23 DIAGNOSIS — R51 Headache: Secondary | ICD-10-CM | POA: Diagnosis not present

## 2017-04-23 DIAGNOSIS — C9000 Multiple myeloma not having achieved remission: Secondary | ICD-10-CM | POA: Diagnosis not present

## 2017-04-23 DIAGNOSIS — Z79899 Other long term (current) drug therapy: Secondary | ICD-10-CM | POA: Diagnosis not present

## 2017-04-23 NOTE — Progress Notes (Signed)
Patient is here for a follow -up appointment. States that he is  having headaches daily . States that he is currently taking tylenol for some relief. States that he is very weak and tired. States that he is having some visual changes in his left eye. States that he has a knot on his temporal area where  He had radiation. States that he is using a cream on his temporal area ,but he is unsure of the name. States that his skin feel dry where he had radiation. Patient has an appointment with Dr. Ludwig Lean on 05/06/17. Vitals:   04/23/17 0946  BP: 139/75  Pulse: 76  Resp: 16  Temp: 97.6 F (36.4 C)  TempSrc: Oral  SpO2: 94%  Weight: 247 lb (112 kg)   . Wt Readings from Last 3 Encounters:  04/23/17 247 lb (112 kg)  04/15/17 245 lb 3.2 oz (111.2 kg)  04/08/17 243 lb 3.2 oz (110.3 kg)

## 2017-04-24 NOTE — Progress Notes (Signed)
Radiation Oncology         [phone]) [phone] ________________________________  Name: Dean [name]. MRN: [id number]  Date of Service: 04/23/2017 DOB: 1935-04-13  Post Treatment Note  CC: [name], MD  Twana First, MD  Diagnosis:  Recurrent multiple myeloma     Interval Since Last Radiation:  7 weeks   02/19/17-03/04/17: Right temporal Scalp/ 30 Gy in 10 fractions  02/21/14-03/08/14 right posterior chest wall area 30 gray  04/19/14-05/02/14 lumbar spine 25 gray  01/05/13-02/10/13-45 gray to left occipital condyle region  12/01/16-11/07/18Parasternal nodule, chest- 24 Gy total delivered in28factionsand left sacro-iliac, pelvis- 24 Gy total delivered in 8 fractions    Narrative:  The patient returns today for routine follow-up.  The patient tolerated his radiotherapy well with only mild hyperpigmentation and itching of the site. He reports today he's feeling well. He occasionally has headaches and has been taking tylenol sometimes up to 6 times in a day. He notes the headaches more with eating. He denies any visual changes or auditory changes. His pretreatment brain scan was again reviewed and did not reveal disease intraaxially.                              ALLERGIES:  is allergic to codeine; morphine and related; and revlimid [lenalidomide].  Meds: Current Outpatient Medications  Medication Sig Dispense Refill  . ALPRAZolam (XANAX) 1 MG tablet TAKE 2 TABLETS BY MOUTH AT BEDTIME AS NEEDED FOR ANXIETY AND OR SLEEP 60 tablet 2  . CALCIUM-VITAMIN D PO Take by mouth. 1500/1000 PO QD    . gabapentin (NEURONTIN) 400 MG capsule TAKE 5 CAPSULES BY MOUTH DAILY 150 capsule 3  . sulfamethoxazole-trimethoprim (BACTRIM DS,SEPTRA DS) 800-160 MG tablet     . tamsulosin (FLOMAX) 0.4 MG CAPS capsule TAKE ONE CAPSULE BY MOUTH AT BEDTIME 30 capsule 3  . aspirin EC 325 MG tablet Take 1 tablet (325 mg total) by mouth daily. (Patient not taking: Reported on 03/25/2017) 30 tablet 11  .  dexamethasone (DECADRON) 4 MG tablet Take 2.5 tablets (10 mg total) by mouth 2 (two) times a week. Take on Tue/Fri every week, do not start until bortezomib is initiated (Patient not taking: Reported on 04/23/2017) 60 tablet 0  . diclofenac sodium (VOLTAREN) 1 % GEL APPLY TO AFFECTED AREA 4 TIMES A DAY AS NEEDED (Patient not taking: Reported on 04/23/2017) 100 g 11  . ibuprofen (ADVIL,MOTRIN) 800 MG tablet TAKE 1 TABLET BY MOUTH EVERY 8 HOURS AS NEEDED FOR MODERATE PAIN. (Patient not taking: Reported on 04/23/2017) 90 tablet 2  . lidocaine-prilocaine (EMLA) cream Apply to affected area once (Patient not taking: Reported on 04/23/2017) 30 g 3  . omeprazole (PRILOSEC) 20 MG capsule Take 1 capsule (20 mg total) by mouth daily. (Patient not taking: Reported on 04/23/2017) 30 capsule 5  . ondansetron (ZOFRAN) 8 MG tablet Take 1 tablet (8 mg total) by mouth 2 (two) times daily as needed (Nausea or vomiting). (Patient not taking: Reported on 03/25/2017) 30 tablet 1  . prochlorperazine (COMPAZINE) 10 MG tablet Take 1 tablet (10 mg total) by mouth every 6 (six) hours as needed (Nausea or vomiting). (Patient not taking: Reported on 03/25/2017) 30 tablet 1  . zolendronic acid (ZOMETA) 4 MG/5ML injection Inject 4 mg into the vein. QOMonth     No current facility-administered medications for this encounter.    Facility-Administered Medications Ordered in Other Encounters  Medication Dose Route Frequency Provider Last  Rate Last Dose  . sodium chloride 0.9 % injection 10 mL  10 mL Intracatheter PRN [facility], PA-C   10 mL at 08/04/14 1103    Physical Findings:  weight is 247 lb (112 kg). His oral temperature is 97.6 F (36.4 C). His blood pressure is 139/75 and his pulse is 76. His respiration is 16 and oxygen saturation is 94%.  Pain Assessment Pain Score: 7  Pain Loc: Head/10 In general this is a well appearing caucasian male in no acute distress. He's alert and oriented x4 and appropriate throughout the  examination. Cardiopulmonary assessment is negative for acute distress and he exhibits normal effort. The right temple region is intact with minimal hair loss and no desquamation. The skin is dry but no ulcers are noted either.  Lab Findings: Lab Results  Component Value Date   WBC 4.0 04/15/2017   HGB 12.9 (L) 04/15/2017   HCT 40.0 04/15/2017   MCV 95.9 04/15/2017   PLT 72 (L) 04/15/2017     Radiographic Findings: No results found.  Impression/Plan: 1. Recurrent multiple myeloma. The patient continues with medical oncology in [address]. We will see him as needed moving forward. We anticipate that pain control from his disease in the temple should improve but rather than tylenol alone, I suggested that he consider ibuprofen and alternating this. We will see him back as needed moving forward.       Carola Rhine, PAC

## 2017-04-27 ENCOUNTER — Other Ambulatory Visit: Payer: Self-pay | Admitting: Family Medicine

## 2017-04-27 DIAGNOSIS — G4700 Insomnia, unspecified: Secondary | ICD-10-CM

## 2017-04-27 DIAGNOSIS — C9000 Multiple myeloma not having achieved remission: Secondary | ICD-10-CM

## 2017-04-27 NOTE — Telephone Encounter (Signed)
Ok to refill??  Last office visit 02/04/2017.  Last refill 01/21/2017, #2 refills.

## 2017-04-29 ENCOUNTER — Other Ambulatory Visit (HOSPITAL_COMMUNITY): Payer: Medicare Other

## 2017-04-29 ENCOUNTER — Ambulatory Visit (HOSPITAL_COMMUNITY): Payer: Medicare Other

## 2017-05-04 NOTE — Progress Notes (Signed)
Diagnosis Multiple myeloma without remission (Dean [name]) - Plan: CBC with Differential/Platelet, Comprehensive metabolic panel, Lactate dehydrogenase, Protein electrophoresis, serum, Kappa/lambda light chains, Beta 2 microglobuline, serum, IgG, IgA, IgM  Staging Cancer Staging No matching staging information was found for the patient.  Assessment and Plan:  1.  Multiple myeloma without remission (Dean [name]) 82 y.o. male with diagnosis of IgG kappa multiple myeloma with previous difficulty tolerating systemic therapy.  Prior imaging demonstrates progressive disease in the calvarium with associated severe pain.  PET/CT was done on 02/17/2017 and was reviewed with the patient by Dr. Lebron Conners and showed improving disease with no new bone lesions.  He remains on Velcade and Zometa.    2.  Joint pain.  He relates this to a recent fall.  Recent PET scan that was done January 2019 showed improving disease and no new bone lesions.  3.  Thrombocytopenia.  Platelet count is noted to be decreased at 72,000. If counts continue to decrease, will have to hold Velcade.    Interval History:  82 y.o. male with IgG kappa multiple myeloma.   He was treated with radiation therapy for palliation of skeletal pain with significant improvement of pain in the areas that have received radiation.  Current Status: Patient is here today for follow-up and for Velcade.  He has  completed radiation therapy.      Multiple myeloma without remission (Dean [name])   11/16/2012 Initial Diagnosis    L occipital condyle destructive lesion, not biopsied secondary to location. XRT      12/01/2012 Imaging    L3 superior endplate compression FX, no lytic lesions to suggest myeloma      02/06/2014 Imaging    soft tissue mass along the right posterior fifth rib with some rib destruction      02/16/2014 Miscellaneous    Normal CBC, Normal CMP, kappa,lamda ratio of 2.62 (abnormal), UIEP with slightly restricted band, IgG kappa, SPEP/IEP with monoclonal  protein at 0.79 g/dl, normal igG, suppressed IGM      02/20/2014 Bone Marrow Biopsy    33% kappa restricted plasma cells Normal FISH, normal cytogenetics      02/21/2014 - 03/08/2014 Radiation Therapy    30Gy to R rib lesion, lesion not biopsied      03/16/2014 PET scan    Scattered hypermetabolic osseous lesions, including the calvarium, ribs, left scapula, sacrum, and left femoral shaft. An expansile left vertebral body lesion at L3 extends into the epidural space of the left lateral recess,       03/17/2014 - 04/07/2014 Chemotherapy    Velcade and Dexamethasone due to initial denial of Revlimid by insurance company.  Zometa monthly.      03/22/2014 Imaging    MR_ L-Spine- Enhancing lesions compatible with multiple myeloma at L2, L3, and S1-2.      04/07/2014 - 07/28/2014 Chemotherapy    RVD with Revlimid at 25 mg days 1-14.  Revlimid dose reduced to 25 mg every other day x 14 days with 7 day respite beginning on day 1 of cycle 2.      04/17/2014 Adverse Reaction    Revlimid-induced rash.  Treated with steroids.      07/28/2014 - 08/04/2014 Chemotherapy    Revlimid daily      08/04/2014 Adverse Reaction    Velcade-induced peripheral neuropathy      08/04/2014 Treatment Plan Change    D/C Velcade      08/11/2014 PET scan    Response to therapy. Improvement and resolution of foci of osseous  hypermetabolism.      08/22/2014 - 08/28/2014 Chemotherapy    Revlimid 25 mg days 1-21 every 28 days      08/28/2014 Adverse Reaction    Revlimid-induced rash. Medication held.  Medrol dose Pak prescribed.      09/04/2014 - 01/08/2015 Chemotherapy    Revlimid 10 mg every other day, without dexamethasone      01/08/2015 - 01/15/2015 Hospital Admission    Colonic diverticular abscess with IR drain placement      03/02/2015 PET scan    Only bony uptake is low activity at site of deformity/callus at R second rib FX, high activity in sigmoid colon, advanced sigmoid divertidulosis. Abscess not  resolved?      06/04/2015 Imaging    CT nonobstructive L renal calculus, diverticulosis of descending and sigmoid colon without inflammation, moderate prostatic enlargement      07/12/2015 Surgery    Diverticulitis s/p robotic sigmoid colectomy with Dr. Johney Maine      08/23/2015 PET scan    Primarily similar hypermetabolic osseous foci within the R sided ribs. new L third rib focus of hypermetab and sclerosis is favored to be related to healing FX. no soft tissue myeloma id'd. Presumed postop hypermetab and edema about the R pelvic wall       03/06/2016 PET scan    1. Reduced activity in the prior lesion such as the right second and fifth rib lesions, activity nearly completely resolved and significantly less than added mediastinal blood pool activity. No new lesions are identified. 2. Other imaging findings of potential clinical significance: Coronary, aortic arch, and branch vessel atherosclerotic vascular disease. Aortoiliac atherosclerotic vascular disease. Enlarged prostate gland. Colonic diverticula.      02/17/2017 PET scan    HEAD/NECK: No hypermetabolic activity in the scalp. No hypermetabolic cervical lymph nodes. CHEST: No hypermetabolic mediastinal or hilar nodes. Right upper lobe scarring/atelectasis. No suspicious pulmonary nodules on the CT scan. ABDOMEN/PELVIS: No abnormal hypermetabolic activity within the liver, pancreas, adrenal glands, or spleen. No hypermetabolic lymph nodes in the abdomen or pelvis. Atherosclerotic calcifications of the abdominal aorta and branch vessels. Left renal sinus cysts. Sigmoid diverticulosis, without evidence of diverticulitis. Prostatomegaly. SKELETON: Vague hypermetabolism involving the inferior sternum, max SUV 3.2, previously 8.2. Focal hypermetabolism involving the left sacrum, max SUV 4.0, previously 6.7. EXTREMITIES: No abnormal hypermetabolic activity in the lower extremities.      02/18/2017 -  Chemotherapy    Bortezomib 1.68m/m2 QWk +  Dexamethasone 175mQTue/Fri --Cycle #1, 02/18/17         Problem List Patient Active Problem List   Diagnosis Date Noted  . Spinal stenosis, lumbar [M48.061] 10/13/2016  . Chronic back pain [M54.9, G89.29] 10/01/2016  . DDD (degenerative disc disease), lumbar [M51.36] 10/01/2016  . OA (osteoarthritis) of knee [M17.10] 10/01/2016  . Diverticulitis s/p robotic sigmoid colectomy 07/11/2015 [K57.32] 07/11/2015  . Special screening for malignant neoplasms, colon [Z12.11]   . Abnormal CT scan, sigmoid colon [R93.3] 04/02/2015  . Abnormal PET scan of colon [R94.8] 04/02/2015  . Thrombocytopenia ([facility][D69.6] 01/09/2015  . Multiple myeloma without remission ([facility][C90.00] 02/15/2014  . Allergic rhinitis [J30.9] 01/16/2014  . History of vertebral stress fracture [Z87.312] 01/16/2014  . Dermatitis [L30.9] 01/16/2014  . Loss of weight [R63.4] 09/05/2013  . Insomnia [G47.00] 09/05/2013  . Fatigue [R53.83] 05/16/2013  . Neck pain [M54.2] 12/07/2012  . Skull lesion [M89.9] 11/29/2012  . Neuropathy ([facility][G62.9]   . BPH (benign prostatic hyperplasia) [N40.0]   . GERD (gastroesophageal reflux disease) [  K21.9]   . H/O ETOH abuse [Z87.898]     Past Medical History Past Medical History:  Diagnosis Date  . BPH (benign prostatic hyperplasia)   . Colonic diverticular abscess 01/08/2015  . Diverticulitis 48/18/56   complicated by abscess and required percutaneous drainage  . GERD (gastroesophageal reflux disease)   . H/O ETOH abuse   . History of chemotherapy last done jan 2017  . History of radiation therapy 01/05/13-02/10/13   45 gray to left occipital condyle region  . History of radiation therapy 12/01/16-12/10/16   Parasternal nodule, chest- 24 Gy total delivered in 8 fractions, Left sacro-iliac, pelvis- 24 Gy total delivered in 8 fractions   . History of radiation therapy 02/19/17-02/22/17   right temporal scalp 30 Gy in 10 fractions  . Intra-abdominal abscess ([place])   . Multiple myeloma  ([place]) 2015  . Neuropathy   . Radiation 02/21/14-03/08/14   right posterior chest wall area 30 gray  . Radiation 04/19/14-05/02/14   lumbar spine 25 gray  . Skull lesion    Left occipital condyle  . Wrist fracture, left    x 2    Past Surgical History Past Surgical History:  Procedure Laterality Date  . COLONOSCOPY N/A 04/19/2015   Procedure: COLONOSCOPY;  Surgeon: Danie Binder, MD;  Location: [facility];  Service: Endoscopy;  Laterality: N/A;  1:30 PM  . CYST EXCISION  1959   tail bone    Family History Family History  Problem Relation Age of Onset  . Diabetes Mother   . Stroke Mother   . Hypertension Father      Social History  reports that he has never smoked. He has never used smokeless tobacco. He reports that he does not drink alcohol or use drugs.  Medications  Current Outpatient Medications:  .  ALPRAZolam (XANAX) 1 MG tablet, TAKE 2 TABLETS BY MOUTH AT BEDTIME AS NEEDED FOR ANXIETY AND OR SLEEP, Disp: 60 tablet, Rfl: 2 .  aspirin EC 325 MG tablet, Take 1 tablet (325 mg total) by mouth daily. (Patient not taking: Reported on 03/25/2017), Disp: 30 tablet, Rfl: 11 .  CALCIUM-VITAMIN D PO, Take by mouth. 1500/1000 PO QD, Disp: , Rfl:  .  dexamethasone (DECADRON) 4 MG tablet, Take 2.5 tablets (10 mg total) by mouth 2 (two) times a week. Take on Tue/Fri every week, do not start until bortezomib is initiated (Patient not taking: Reported on 04/23/2017), Disp: 60 tablet, Rfl: 0 .  diclofenac sodium (VOLTAREN) 1 % GEL, APPLY TO AFFECTED AREA 4 TIMES A DAY AS NEEDED (Patient not taking: Reported on 04/23/2017), Disp: 100 g, Rfl: 11 .  gabapentin (NEURONTIN) 400 MG capsule, TAKE 5 CAPSULES BY MOUTH DAILY, Disp: 150 capsule, Rfl: 3 .  ibuprofen (ADVIL,MOTRIN) 800 MG tablet, TAKE 1 TABLET BY MOUTH EVERY 8 HOURS AS NEEDED FOR MODERATE PAIN. (Patient not taking: Reported on 04/23/2017), Disp: 90 tablet, Rfl: 2 .  lidocaine-prilocaine (EMLA) cream, Apply to affected area once (Patient  not taking: Reported on 04/23/2017), Disp: 30 g, Rfl: 3 .  omeprazole (PRILOSEC) 20 MG capsule, Take 1 capsule (20 mg total) by mouth daily. (Patient not taking: Reported on 04/23/2017), Disp: 30 capsule, Rfl: 5 .  ondansetron (ZOFRAN) 8 MG tablet, Take 1 tablet (8 mg total) by mouth 2 (two) times daily as needed (Nausea or vomiting). (Patient not taking: Reported on 03/25/2017), Disp: 30 tablet, Rfl: 1 .  prochlorperazine (COMPAZINE) 10 MG tablet, Take 1 tablet (10 mg total) by mouth every 6 (six)  hours as needed (Nausea or vomiting). (Patient not taking: Reported on 03/25/2017), Disp: 30 tablet, Rfl: 1 .  sulfamethoxazole-trimethoprim (BACTRIM DS,SEPTRA DS) 800-160 MG tablet, , Disp: , Rfl:  .  tamsulosin (FLOMAX) 0.4 MG CAPS capsule, TAKE ONE CAPSULE BY MOUTH AT BEDTIME, Disp: 30 capsule, Rfl: 3 .  zolendronic acid (ZOMETA) 4 MG/5ML injection, Inject 4 mg into the vein. QOMonth, Disp: , Rfl:  No current facility-administered medications for this visit.   Facility-Administered Medications Ordered in Other Visits:  .  sodium chloride 0.9 % injection 10 mL, 10 mL, Intracatheter, PRN, [facility], PA-C, 10 mL at 08/04/14 1103  Allergies Codeine; Morphine and related; and Revlimid [lenalidomide]  Review of Systems Review of Systems - Oncology ROS as per HPI otherwise 12 point ROS is negative.   Physical Exam  Vitals Wt Readings from Last 3 Encounters:  04/23/17 247 lb (112 kg)  04/15/17 245 lb 3.2 oz (111.2 kg)  04/08/17 243 lb 3.2 oz (110.3 kg)   Temp Readings from Last 3 Encounters:  04/23/17 97.6 F (36.4 C) (Oral)  04/15/17 97.8 F (36.6 C) (Oral)  04/08/17 (!) 97.5 F (36.4 C) (Oral)   BP Readings from Last 3 Encounters:  04/23/17 139/75  04/15/17 (!) 140/58  04/08/17 (!) 136/54   Pulse Readings from Last 3 Encounters:  04/23/17 76  04/15/17 (!) 58  04/08/17 64   Constitutional: Well-developed, well-nourished, and in no distress.   HENT: Head: Normocephalic and  atraumatic.  Mouth/Throat: No oropharyngeal exudate. Mucosa moist. Eyes: Pupils are equal, round, and reactive to light. Conjunctivae are normal. No scleral icterus.  Neck: Normal range of motion. Neck supple. No JVD present.  Cardiovascular: Normal rate, regular rhythm and normal heart sounds.  Exam reveals no gallop and no friction rub.   No murmur heard. Pulmonary/Chest: Effort normal and breath sounds normal. No respiratory distress. No wheezes.No rales.  Abdominal: Soft. Bowel sounds are normal. No distension. There is no tenderness. There is no guarding.  Musculoskeletal: No edema or tenderness.  Lymphadenopathy: No cervical, axillary or supraclavicular adenopathy.  Neurological: Alert and oriented to person, place, and time. No cranial nerve deficit.  Skin: Skin is warm and dry. No rash noted. No erythema. No pallor.  Psychiatric: Affect and judgment normal.   Labs Appointment on 04/08/2017  Component Date Value Ref Range Status  . WBC 04/08/2017 9.5  4.0 - 10.5 K/uL Final  . RBC 04/08/2017 4.38  4.22 - 5.81 MIL/uL Final  . Hemoglobin 04/08/2017 13.7  13.0 - 17.0 g/dL Final  . HCT 04/08/2017 41.5  39.0 - 52.0 % Final  . MCV 04/08/2017 94.7  78.0 - 100.0 fL Final  . MCH 04/08/2017 31.3  26.0 - 34.0 pg Final  . MCHC 04/08/2017 33.0  30.0 - 36.0 g/dL Final  . RDW 04/08/2017 14.1  11.5 - 15.5 % Final  . Platelets 04/08/2017 105* 150 - 400 K/uL Final   Comment: CONSISTENT WITH PREVIOUS RESULT SPECIMEN CHECKED FOR CLOTS   . Neutrophils Relative % 04/08/2017 84  % Final  . Neutro Abs 04/08/2017 8.0* 1.7 - 7.7 K/uL Final  . Lymphocytes Relative 04/08/2017 7  % Final  . Lymphs Abs 04/08/2017 0.6* 0.7 - 4.0 K/uL Final  . Monocytes Relative 04/08/2017 9  % Final  . Monocytes Absolute 04/08/2017 0.9  0.1 - 1.0 K/uL Final  . Eosinophils Relative 04/08/2017 0  % Final  . Eosinophils Absolute 04/08/2017 0.0  0.0 - 0.7 K/uL Final  . Basophils Relative 04/08/2017  0  % Final  . Basophils  Absolute 04/08/2017 0.0  0.0 - 0.1 K/uL Final   Performed at [facility], [address] [place], [place] [address]  . Sodium 04/08/2017 138  135 - 145 mmol/L Final  . Potassium 04/08/2017 4.6  3.5 - 5.1 mmol/L Final  . Chloride 04/08/2017 102  101 - 111 mmol/L Final  . CO2 04/08/2017 25  22 - 32 mmol/L Final  . Glucose, Bld 04/08/2017 103* 65 - 99 mg/dL Final  . BUN 04/08/2017 27* 6 - 20 mg/dL Final  . Creatinine, Ser 04/08/2017 1.10  0.61 - 1.24 mg/dL Final  . Calcium 04/08/2017 9.2  8.9 - 10.3 mg/dL Final  . Total Protein 04/08/2017 6.5  6.5 - 8.1 g/dL Final  . Albumin 04/08/2017 3.9  3.5 - 5.0 g/dL Final  . AST 04/08/2017 20  15 - 41 U/L Final  . ALT 04/08/2017 15* 17 - 63 U/L Final  . Alkaline Phosphatase 04/08/2017 31* 38 - 126 U/L Final  . Total Bilirubin 04/08/2017 0.7  0.3 - 1.2 mg/dL Final  . GFR calc non Af Amer 04/08/2017 >60  >60 mL/min Final  . GFR calc Af Amer 04/08/2017 >60  >60 mL/min Final   Comment: (NOTE) The eGFR has been calculated using the CKD EPI equation. This calculation has not been validated in all clinical situations. eGFR's persistently <60 mL/min signify possible Chronic Kidney Disease.   Georgiann Hahn gap 04/08/2017 11  5 - 15 Final   Performed at [facility], [address], [place], [place] [address]  . LDH 04/08/2017 138  98 - 192 U/L Final   Performed at [facility], [address] [place], [place] [address]     Pathology Orders Placed This Encounter  Procedures  . CBC with Differential/Platelet    Standing Status:   Future    Standing Expiration Date:   04/09/2018  . Comprehensive metabolic panel    Standing Status:   Future    Standing Expiration Date:   04/09/2018  . Lactate dehydrogenase    Standing Status:   Future    Standing Expiration Date:   04/09/2018  . Protein electrophoresis, serum    Standing Status:   Future    Standing Expiration Date:   04/09/2018  . Kappa/lambda light chains    Standing Status:   Future    Standing Expiration  Date:   04/09/2018  . Beta 2 microglobuline, serum    Standing Status:   Future    Standing Expiration Date:   04/09/2018  . IgG, IgA, IgM    Standing Status:   Future    Standing Expiration Date:   04/09/2018       Zoila Shutter MD

## 2017-05-06 ENCOUNTER — Encounter (HOSPITAL_COMMUNITY): Payer: Self-pay | Admitting: Hematology

## 2017-05-06 ENCOUNTER — Other Ambulatory Visit: Payer: Self-pay

## 2017-05-06 ENCOUNTER — Inpatient Hospital Stay (HOSPITAL_COMMUNITY): Payer: Medicare Other | Attending: Hematology | Admitting: Hematology

## 2017-05-06 ENCOUNTER — Inpatient Hospital Stay (HOSPITAL_COMMUNITY): Payer: Medicare Other

## 2017-05-06 ENCOUNTER — Ambulatory Visit (HOSPITAL_COMMUNITY): Payer: Medicare Other

## 2017-05-06 VITALS — BP 123/64 | HR 65 | Temp 97.5°F | Resp 18 | Ht 69.0 in | Wt 251.0 lb

## 2017-05-06 DIAGNOSIS — Z5112 Encounter for antineoplastic immunotherapy: Secondary | ICD-10-CM | POA: Insufficient documentation

## 2017-05-06 DIAGNOSIS — T451X5A Adverse effect of antineoplastic and immunosuppressive drugs, initial encounter: Secondary | ICD-10-CM | POA: Diagnosis not present

## 2017-05-06 DIAGNOSIS — G620 Drug-induced polyneuropathy: Secondary | ICD-10-CM | POA: Diagnosis not present

## 2017-05-06 DIAGNOSIS — C9000 Multiple myeloma not having achieved remission: Secondary | ICD-10-CM | POA: Diagnosis not present

## 2017-05-06 LAB — COMPREHENSIVE METABOLIC PANEL
ALT: 13 U/L — ABNORMAL LOW (ref 17–63)
AST: 16 U/L (ref 15–41)
Albumin: 3.6 g/dL (ref 3.5–5.0)
Alkaline Phosphatase: 27 U/L — ABNORMAL LOW (ref 38–126)
Anion gap: 10 (ref 5–15)
BUN: 23 mg/dL — ABNORMAL HIGH (ref 6–20)
CO2: 26 mmol/L (ref 22–32)
Calcium: 9.2 mg/dL (ref 8.9–10.3)
Chloride: 105 mmol/L (ref 101–111)
Creatinine, Ser: 0.84 mg/dL (ref 0.61–1.24)
GFR calc Af Amer: >60 mL/min (ref >60)
GFR calc non Af Amer: >60 mL/min (ref >60)
Glucose, Bld: 101 mg/dL — ABNORMAL HIGH (ref 65–99)
Potassium: 4.2 mmol/L (ref 3.5–5.1)
Sodium: 141 mmol/L (ref 135–145)
Total Bilirubin: 1 mg/dL (ref 0.3–1.2)
Total Protein: 6.1 g/dL — ABNORMAL LOW (ref 6.5–8.1)

## 2017-05-06 LAB — LACTATE DEHYDROGENASE: LDH: 135 U/L (ref 98–192)

## 2017-05-06 LAB — CBC WITH DIFFERENTIAL/PLATELET
Basophils Absolute: 0 10*3/uL (ref 0.0–0.1)
Basophils Relative: 0 %
Eosinophils Absolute: 0 10*3/uL (ref 0.0–0.7)
Eosinophils Relative: 1 %
HCT: 41.8 % (ref 39.0–52.0)
Hemoglobin: 13.7 g/dL (ref 13.0–17.0)
Lymphocytes Relative: 22 %
Lymphs Abs: 0.9 10*3/uL (ref 0.7–4.0)
MCH: 31 pg (ref 26.0–34.0)
MCHC: 32.8 g/dL (ref 30.0–36.0)
MCV: 94.6 fL (ref 78.0–100.0)
Monocytes Absolute: 0.7 10*3/uL (ref 0.1–1.0)
Monocytes Relative: 16 %
Neutro Abs: 2.6 10*3/uL (ref 1.7–7.7)
Neutrophils Relative %: 61 %
Platelets: 108 10*3/uL — ABNORMAL LOW (ref 150–400)
RBC: 4.42 MIL/uL (ref 4.22–5.81)
RDW: 13.4 % (ref 11.5–15.5)
WBC: 4.3 10*3/uL (ref 4.0–10.5)

## 2017-05-06 MED ORDER — DEXAMETHASONE 4 MG PO TABS: 10.0000 mg | ORAL_TABLET | ORAL | 0 refills | Status: AC

## 2017-05-06 MED ORDER — BORTEZOMIB CHEMO SQ INJECTION 3.5 MG (2.5MG/ML)
1.3000 mg/m2 | Freq: Once | INTRAMUSCULAR | Status: AC
  Administered 2017-05-06: 3 mg via SUBCUTANEOUS
  Filled 2017-05-06: qty 3

## 2017-05-06 MED ORDER — PROCHLORPERAZINE MALEATE 10 MG PO TABS
10.0000 mg | ORAL_TABLET | Freq: Once | ORAL | Status: AC
  Administered 2017-05-06: 10 mg via ORAL
  Filled 2017-05-06: qty 1

## 2017-05-06 NOTE — Patient Instructions (Signed)
Exam and discussion today with Dr. Lysbeth Penner scheduled every 2 weeks with lab appointment prior. On the day you will receive your Velcade injection, take FIVE dexamethasone (Decadron) tablets.  You will only need to take this medication on the days you will be getting your Velcade shot.

## 2017-05-06 NOTE — Assessment & Plan Note (Signed)
1.  IgG kappa multiple myeloma: - Initially treated with RVD from 04/07/2014 through 07/28/2014, placed on maintenance Revlimid, which was discontinued 1 and half year ago, unknown reasons, patient thinks it was discontinued when he was receiving radiation. -MRI in January of the brain showed new right temporal lesion, PET/CT scan in January 2019 showed improvement of multiple myeloma with no new lesions, started on weekly Velcade and dexamethasone on 02/18/2017, status post 2 cycles - M spike on 04/01/2017 decreased to 0.2, kappa to lambda ratio on 03/04/2017 was down to 2.06. -he was evaluated by radiation oncology, no radiation was recommended. -In view of his peripheral neuropathy, we have agreed to change Velcade to every other week.  He will continue dexamethasone 20 mg on days of Velcade.  I will reevaluate him by repeating all the myeloma workup in 2 months.  2.  Peripheral neuropathy: He has numbness and tingling in his feet and legs, extending up to his knees.  He is taking gabapentin 3 times a day.  This is controlled fairly well at this time. 

## 2017-05-06 NOTE — Progress Notes (Signed)
[name], [place] [address]   CLINIC:  Medical Oncology/Hematology  PCP:  [name], MD [address] [place] [address] [phone]   REASON FOR VISIT:  Follow-up for full myeloma.  CURRENT THERAPY: Velcade and dexamethasone.  BRIEF ONCOLOGIC HISTORY:    Multiple myeloma without remission ([facility])   11/16/2012 Initial Diagnosis    L occipital condyle destructive lesion, not biopsied secondary to location. XRT      12/01/2012 Imaging    L3 superior endplate compression FX, no lytic lesions to suggest myeloma      02/06/2014 Imaging    soft tissue mass along the right posterior fifth rib with some rib destruction      02/16/2014 Miscellaneous    Normal CBC, Normal CMP, kappa,lamda ratio of 2.62 (abnormal), UIEP with slightly restricted band, IgG kappa, SPEP/IEP with monoclonal protein at 0.79 g/dl, normal igG, suppressed IGM      02/20/2014 Bone Marrow Biopsy    33% kappa restricted plasma cells Normal FISH, normal cytogenetics      02/21/2014 - 03/08/2014 Radiation Therapy    30Gy to R rib lesion, lesion not biopsied      03/16/2014 PET scan    Scattered hypermetabolic osseous lesions, including the calvarium, ribs, left scapula, sacrum, and left femoral shaft. An expansile left vertebral body lesion at L3 extends into the epidural space of the left lateral recess,       03/17/2014 - 04/07/2014 Chemotherapy    Velcade and Dexamethasone due to initial denial of Revlimid by insurance company.  Zometa monthly.      03/22/2014 Imaging    MR_ L-Spine- Enhancing lesions compatible with multiple myeloma at L2, L3, and S1-2.      04/07/2014 - 07/28/2014 Chemotherapy    RVD with Revlimid at 25 mg days 1-14.  Revlimid dose reduced to 25 mg every other day x 14 days with 7 day respite beginning on day 1 of cycle 2.      04/17/2014 Adverse Reaction    Revlimid-induced rash.  Treated with steroids.      07/28/2014 - 08/04/2014  Chemotherapy    Revlimid daily      08/04/2014 Adverse Reaction    Velcade-induced peripheral neuropathy      08/04/2014 Treatment Plan Change    D/C Velcade      08/11/2014 PET scan    Response to therapy. Improvement and resolution of foci of osseous hypermetabolism.      08/22/2014 - 08/28/2014 Chemotherapy    Revlimid 25 mg days 1-21 every 28 days      08/28/2014 Adverse Reaction    Revlimid-induced rash. Medication held.  Medrol dose Pak prescribed.      09/04/2014 - 01/08/2015 Chemotherapy    Revlimid 10 mg every other day, without dexamethasone      01/08/2015 - 01/15/2015 Hospital Admission    Colonic diverticular abscess with IR drain placement      03/02/2015 PET scan    Only bony uptake is low activity at site of deformity/callus at R second rib FX, high activity in sigmoid colon, advanced sigmoid divertidulosis. Abscess not resolved?      06/04/2015 Imaging    CT nonobstructive L renal calculus, diverticulosis of descending and sigmoid colon without inflammation, moderate prostatic enlargement      07/12/2015 Surgery    Diverticulitis s/p robotic sigmoid colectomy with Dr. Johney Maine      08/23/2015 PET scan    Primarily similar  hypermetabolic osseous foci within the R sided ribs. new L third rib focus of hypermetab and sclerosis is favored to be related to healing FX. no soft tissue myeloma id'd. Presumed postop hypermetab and edema about the R pelvic wall       03/06/2016 PET scan    1. Reduced activity in the prior lesion such as the right second and fifth rib lesions, activity nearly completely resolved and significantly less than added mediastinal blood pool activity. No new lesions are identified. 2. Other imaging findings of potential clinical significance: Coronary, aortic arch, and branch vessel atherosclerotic vascular disease. Aortoiliac atherosclerotic vascular disease. Enlarged prostate gland. Colonic diverticula.      02/17/2017 PET scan    HEAD/NECK: No  hypermetabolic activity in the scalp. No hypermetabolic cervical lymph nodes. CHEST: No hypermetabolic mediastinal or hilar nodes. Right upper lobe scarring/atelectasis. No suspicious pulmonary nodules on the CT scan. ABDOMEN/PELVIS: No abnormal hypermetabolic activity within the liver, pancreas, adrenal glands, or spleen. No hypermetabolic lymph nodes in the abdomen or pelvis. Atherosclerotic calcifications of the abdominal aorta and branch vessels. Left renal sinus cysts. Sigmoid diverticulosis, without evidence of diverticulitis. Prostatomegaly. SKELETON: Vague hypermetabolism involving the inferior sternum, max SUV 3.2, previously 8.2. Focal hypermetabolism involving the left sacrum, max SUV 4.0, previously 6.7. EXTREMITIES: No abnormal hypermetabolic activity in the lower extremities.      02/18/2017 -  Chemotherapy    Bortezomib 1.65m/m2 QWk + Dexamethasone 13mQTue/Fri --Cycle #1, 02/18/17       02/18/2017 -  Chemotherapy    The patient had bortezomib SQ (VELCADE) chemo injection 3 mg, 1.3 mg/m2 = 3 mg, Subcutaneous,  Once, 3 of 6 cycles Administration: 3 mg (02/18/2017), 3 mg (02/26/2017), 3 mg (03/04/2017), 3 mg (03/11/2017), 3 mg (04/08/2017), 3 mg (03/18/2017), 3 mg (03/25/2017), 3 mg (04/01/2017)  for chemotherapy treatment.         CANCER STAGING: Cancer Staging No matching staging information was found for the patient.   INTERVAL HISTORY:  Dean [name]. male returns for follow-up of multiple myeloma.  He has received 2 cycles of weekly Velcade with dexamethasone, days 1, 8, 15 every 21 days.  His M spike has come down to 0.2 g.  He complains of feeling weak.  He also has neuropathy with numbness and tingling extending up to his knees.  He is taking gabapentin 3 times a day.  He denies any nausea, vomiting or diarrhea from Velcade.  He is tolerating dexamethasone well.  He has occasional right temporal headache.  He was evaluated by radiation oncology and no radiation was  recommended.    REVIEW OF SYSTEMS:  Review of Systems  Constitutional: Positive for fatigue.  HENT:  Negative.   Respiratory: Negative.   Cardiovascular: Positive for leg swelling.  Gastrointestinal: Negative.   Genitourinary: Negative.    Musculoskeletal: Negative.   Skin: Positive for itching.  Neurological: Positive for numbness.  Hematological: Negative.   Psychiatric/Behavioral: Negative.      PAST MEDICAL/SURGICAL HISTORY:  Past Medical History:  Diagnosis Date  . BPH (benign prostatic hyperplasia)   . Colonic diverticular abscess 01/08/2015  . Diverticulitis 1102/40/97 complicated by abscess and required percutaneous drainage  . GERD (gastroesophageal reflux disease)   . H/O ETOH abuse   . History of chemotherapy last done jan 2017  . History of radiation therapy 01/05/13-02/10/13   45 gray to left occipital condyle region  . History of radiation therapy 12/01/16-12/10/16   Parasternal nodule, chest- 24 Gy total delivered  in 8 fractions, Left sacro-iliac, pelvis- 24 Gy total delivered in 8 fractions   . History of radiation therapy 02/19/17-02/22/17   right temporal scalp 30 Gy in 10 fractions  . Intra-abdominal abscess ([facility])   . Multiple myeloma ([facility]) 2015  . Neuropathy   . Radiation 02/21/14-03/08/14   right posterior chest wall area 30 gray  . Radiation 04/19/14-05/02/14   lumbar spine 25 gray  . Skull lesion    Left occipital condyle  . Wrist fracture, left    x 2   Past Surgical History:  Procedure Laterality Date  . COLONOSCOPY N/A 04/19/2015   Procedure: COLONOSCOPY;  Surgeon: Danie Binder, MD;  Location: [facility];  Service: Endoscopy;  Laterality: N/A;  1:30 PM  . CYST EXCISION  1959   tail bone     SOCIAL HISTORY:  Social History   Socioeconomic History  . Marital status: Married    Spouse name: Not on file  . Number of children: 3  . Years of education: Not on file  . Highest education level: Not on file  Occupational History     Employer: RETIRED  Social Needs  . Financial resource strain: Not on file  . Food insecurity:    Worry: Not on file    Inability: Not on file  . Transportation needs:    Medical: Not on file    Non-medical: Not on file  Tobacco Use  . Smoking status: Never Smoker  . Smokeless tobacco: Never Used  Substance and Sexual Activity  . Alcohol use: No    Comment: " Not much no more"  . Drug use: No  . Sexual activity: Not on file  Lifestyle  . Physical activity:    Days per week: Not on file    Minutes per session: Not on file  . Stress: Not on file  Relationships  . Social connections:    Talks on phone: Not on file    Gets together: Not on file    Attends religious service: Not on file    Active member of club or organization: Not on file    Attends meetings of clubs or organizations: Not on file    Relationship status: Not on file  . Intimate partner violence:    Fear of current or ex partner: Not on file    Emotionally abused: Not on file    Physically abused: Not on file    Forced sexual activity: Not on file  Other Topics Concern  . Not on file  Social History Narrative  . Not on file    FAMILY HISTORY:  Family History  Problem Relation Age of Onset  . Diabetes Mother   . Stroke Mother   . Hypertension Father     CURRENT MEDICATIONS:  Outpatient Encounter Medications as of 05/06/2017  Medication Sig  . ALPRAZolam (XANAX) 1 MG tablet TAKE 2 TABLETS BY MOUTH AT BEDTIME AS NEEDED FOR ANXIETY AND OR SLEEP  . aspirin EC 325 MG tablet Take 1 tablet (325 mg total) by mouth daily.  Marland Kitchen CALCIUM-VITAMIN D PO Take by mouth. 1500/1000 PO QD  . gabapentin (NEURONTIN) 400 MG capsule TAKE 5 CAPSULES BY MOUTH DAILY  . ibuprofen (ADVIL,MOTRIN) 800 MG tablet TAKE 1 TABLET BY MOUTH EVERY 8 HOURS AS NEEDED FOR MODERATE PAIN.  Marland Kitchen lidocaine-prilocaine (EMLA) cream Apply to affected area once  . omeprazole (PRILOSEC) 20 MG capsule Take 1 capsule (20 mg total) by mouth daily.  Marland Kitchen  sulfamethoxazole-trimethoprim (BACTRIM DS,SEPTRA DS)  800-160 MG tablet   . tamsulosin (FLOMAX) 0.4 MG CAPS capsule TAKE ONE CAPSULE BY MOUTH AT BEDTIME  . zolendronic acid (ZOMETA) 4 MG/5ML injection Inject 4 mg into the vein. QOMonth  . dexamethasone (DECADRON) 4 MG tablet Take 2.5 tablets (10 mg total) by mouth 2 (two) times a week. Take on Tue/Fri every week, do not start until bortezomib is initiated (Patient not taking: Reported on 04/23/2017)  . diclofenac sodium (VOLTAREN) 1 % GEL APPLY TO AFFECTED AREA 4 TIMES A DAY AS NEEDED (Patient not taking: Reported on 04/23/2017)  . ondansetron (ZOFRAN) 8 MG tablet Take 1 tablet (8 mg total) by mouth 2 (two) times daily as needed (Nausea or vomiting). (Patient not taking: Reported on 03/25/2017)  . prochlorperazine (COMPAZINE) 10 MG tablet Take 1 tablet (10 mg total) by mouth every 6 (six) hours as needed (Nausea or vomiting). (Patient not taking: Reported on 03/25/2017)   Facility-Administered Encounter Medications as of 05/06/2017  Medication  . sodium chloride 0.9 % injection 10 mL    ALLERGIES:  Allergies  Allergen Reactions  . Codeine Other (See Comments)    Severe headache  . Morphine And Related Other (See Comments)    Makes him feel weird  . Revlimid [Lenalidomide] Other (See Comments)    "Causes me to become weak"     PHYSICAL EXAM:  ECOG Performance status: 2  Vitals:   05/06/17 1135  BP: 123/64  Pulse: 65  Resp: 18  Temp: (!) 97.5 F (36.4 C)  SpO2: 96%   Filed Weights   05/06/17 1135  Weight: 251 lb (113.9 kg)       LABORATORY DATA:  I have reviewed the labs as listed.  CBC    Component Value Date/Time   WBC 4.3 05/06/2017 0947   RBC 4.42 05/06/2017 0947   HGB 13.7 05/06/2017 0947   HCT 41.8 05/06/2017 0947   PLT 108 (L) 05/06/2017 0947   MCV 94.6 05/06/2017 0947   MCH 31.0 05/06/2017 0947   MCHC 32.8 05/06/2017 0947   RDW 13.4 05/06/2017 0947   LYMPHSABS 0.9 05/06/2017 0947   MONOABS 0.7 05/06/2017  0947   EOSABS 0.0 05/06/2017 0947   BASOSABS 0.0 05/06/2017 0947   CMP Latest Ref Rng & Units 05/06/2017 04/15/2017 04/08/2017  Glucose 65 - 99 mg/dL 101(H) 90 103(H)  BUN 6 - 20 mg/dL 23(H) 23(H) 27(H)  Creatinine 0.61 - 1.24 mg/dL 0.84 0.88 1.10  Sodium 135 - 145 mmol/L 141 137 138  Potassium 3.5 - 5.1 mmol/L 4.2 4.2 4.6  Chloride 101 - 111 mmol/L 105 105 102  CO2 22 - 32 mmol/L 26 23 25   Calcium 8.9 - 10.3 mg/dL 9.2 8.6(L) 9.2  Total Protein 6.5 - 8.1 g/dL 6.1(L) 5.6(L) 6.5  Total Bilirubin 0.3 - 1.2 mg/dL 1.0 0.5 0.7  Alkaline Phos 38 - 126 U/L 27(L) 30(L) 31(L)  AST 15 - 41 U/L 16 17 20   ALT 17 - 63 U/L 13(L) 16(L) 15(L)       DIAGNOSTIC IMAGING:  I have independently reviewed his PET CT scan and MRI of the brain from January and agree with the findings.    ASSESSMENT & PLAN:   Multiple myeloma without remission ([facility]) 1.  IgG kappa multiple myeloma: - Initially treated with RVD from 04/07/2014 through 07/28/2014, placed on maintenance Revlimid, which was discontinued 1 and half year ago, unknown reasons, patient thinks it was discontinued when he was receiving radiation. -MRI in January of the brain showed new right  temporal lesion, PET/CT scan in January 2019 showed improvement of multiple myeloma with no new lesions, started on weekly Velcade and dexamethasone on 02/18/2017, status post 2 cycles - M spike on 04/01/2017 decreased to 0.2, kappa to lambda ratio on 03/04/2017 was down to 2.06. -he was evaluated by radiation oncology, no radiation was recommended. -In view of his peripheral neuropathy, we have agreed to change Velcade to every other week.  He will continue dexamethasone 20 mg on days of Velcade.  I will reevaluate him by repeating all the myeloma workup in 2 months.  2.  Peripheral neuropathy: He has numbness and tingling in his feet and legs, extending up to his knees.  He is taking gabapentin 3 times a day.  This is controlled fairly well at this time.      Orders  placed this encounter:  Orders Placed This Encounter  Procedures  . CBC with Differential  . Comprehensive metabolic panel  . Protein electrophoresis, serum  . Kappa/lambda light chains      Derek Jack, [place] [phone]

## 2017-05-06 NOTE — Progress Notes (Signed)
Dean [name]. presents today for injection per the provider's orders.  Velcade administration without incident; see MAR for injection details.  Patient tolerated procedure well and without incident.  No questions or complaints noted at this time.  Discharged ambulatory in c/o family.

## 2017-05-07 LAB — PROTEIN ELECTROPHORESIS, SERUM
A/G Ratio: 1.7 (ref 0.7–1.7)
Albumin ELP: 3.5 g/dL (ref 2.9–4.4)
Alpha-1-Globulin: 0.2 g/dL (ref 0.0–0.4)
Alpha-2-Globulin: 0.7 g/dL (ref 0.4–1.0)
Beta Globulin: 0.9 g/dL (ref 0.7–1.3)
Gamma Globulin: 0.4 g/dL (ref 0.4–1.8)
Globulin, Total: 2.1 g/dL — ABNORMAL LOW (ref 2.2–3.9)
M-Spike, %: 0.2 g/dL — ABNORMAL HIGH
Total Protein ELP: 5.6 g/dL — ABNORMAL LOW (ref 6.0–8.5)

## 2017-05-07 LAB — KAPPA/LAMBDA LIGHT CHAINS
Kappa free light chain: 31 mg/L — ABNORMAL HIGH (ref 3.3–19.4)
Kappa, lambda light chain ratio: 3.04 — ABNORMAL HIGH (ref 0.26–1.65)
Lambda free light chains: 10.2 mg/L (ref 5.7–26.3)

## 2017-05-07 LAB — IGG, IGA, IGM
IgA: 79 mg/dL (ref 61–437)
IgG (Immunoglobin G), Serum: 471 mg/dL — ABNORMAL LOW (ref 700–1600)
IgM (Immunoglobulin M), Srm: 27 mg/dL (ref 15–143)

## 2017-05-07 LAB — BETA 2 MICROGLOBULIN, SERUM: Beta-2 Microglobulin: 1.8 mg/L (ref 0.6–2.4)

## 2017-05-20 ENCOUNTER — Inpatient Hospital Stay (HOSPITAL_COMMUNITY): Payer: Medicare Other

## 2017-05-20 ENCOUNTER — Other Ambulatory Visit: Payer: Self-pay

## 2017-05-20 ENCOUNTER — Encounter (HOSPITAL_COMMUNITY): Payer: Self-pay

## 2017-05-20 VITALS — BP 155/75 | HR 88 | Temp 97.7°F | Resp 16 | Ht 69.0 in | Wt 253.0 lb

## 2017-05-20 DIAGNOSIS — C9000 Multiple myeloma not having achieved remission: Secondary | ICD-10-CM

## 2017-05-20 DIAGNOSIS — Z5112 Encounter for antineoplastic immunotherapy: Secondary | ICD-10-CM | POA: Diagnosis not present

## 2017-05-20 LAB — CBC WITH DIFFERENTIAL/PLATELET
Basophils Absolute: 0 10*3/uL (ref 0.0–0.1)
Basophils Relative: 0 %
Eosinophils Absolute: 0 10*3/uL (ref 0.0–0.7)
Eosinophils Relative: 0 %
HCT: 42.2 % (ref 39.0–52.0)
Hemoglobin: 14.2 g/dL (ref 13.0–17.0)
Lymphocytes Relative: 11 %
Lymphs Abs: 0.6 10*3/uL — ABNORMAL LOW (ref 0.7–4.0)
MCH: 31.8 pg (ref 26.0–34.0)
MCHC: 33.6 g/dL (ref 30.0–36.0)
MCV: 94.4 fL (ref 78.0–100.0)
Monocytes Absolute: 0.1 10*3/uL (ref 0.1–1.0)
Monocytes Relative: 2 %
Neutro Abs: 5 10*3/uL (ref 1.7–7.7)
Neutrophils Relative %: 87 %
Platelets: 119 10*3/uL — ABNORMAL LOW (ref 150–400)
RBC: 4.47 MIL/uL (ref 4.22–5.81)
RDW: 13.4 % (ref 11.5–15.5)
WBC: 5.8 10*3/uL (ref 4.0–10.5)

## 2017-05-20 LAB — COMPREHENSIVE METABOLIC PANEL
ALT: 18 U/L (ref 17–63)
AST: 23 U/L (ref 15–41)
Albumin: 3.9 g/dL (ref 3.5–5.0)
Alkaline Phosphatase: 29 U/L — ABNORMAL LOW (ref 38–126)
Anion gap: 9 (ref 5–15)
BUN: 26 mg/dL — ABNORMAL HIGH (ref 6–20)
CO2: 24 mmol/L (ref 22–32)
Calcium: 8.7 mg/dL — ABNORMAL LOW (ref 8.9–10.3)
Chloride: 105 mmol/L (ref 101–111)
Creatinine, Ser: 0.95 mg/dL (ref 0.61–1.24)
GFR calc Af Amer: >60 mL/min (ref >60)
GFR calc non Af Amer: >60 mL/min (ref >60)
Glucose, Bld: 126 mg/dL — ABNORMAL HIGH (ref 65–99)
Potassium: 4.4 mmol/L (ref 3.5–5.1)
Sodium: 138 mmol/L (ref 135–145)
Total Bilirubin: 0.8 mg/dL (ref 0.3–1.2)
Total Protein: 6.6 g/dL (ref 6.5–8.1)

## 2017-05-20 LAB — LACTATE DEHYDROGENASE: LDH: 151 U/L (ref 98–192)

## 2017-05-20 MED ORDER — BORTEZOMIB CHEMO SQ INJECTION 3.5 MG (2.5MG/ML)
1.3000 mg/m2 | Freq: Once | INTRAMUSCULAR | Status: AC
  Administered 2017-05-20: 3 mg via SUBCUTANEOUS
  Filled 2017-05-20: qty 3

## 2017-05-20 MED ORDER — PROCHLORPERAZINE MALEATE 10 MG PO TABS
10.0000 mg | ORAL_TABLET | Freq: Once | ORAL | Status: AC
  Administered 2017-05-20: 10 mg via ORAL
  Filled 2017-05-20: qty 1

## 2017-05-20 MED ORDER — ACYCLOVIR 400 MG PO TABS: 400.0000 mg | ORAL_TABLET | Freq: Two times a day (BID) | ORAL | 3 refills | Status: DC

## 2017-05-20 NOTE — Progress Notes (Signed)
Labs within limits for chemotherapy today. Velcade given per orders.  Treatment given per orders. Patient tolerated it well without problems. Vitals stable and discharged home from clinic ambulatory. Follow up as scheduled.

## 2017-05-20 NOTE — Patient Instructions (Signed)
[facility] Discharge Instructions for Patients Receiving Chemotherapy   Beginning January 23rd 2017 lab work for the Cancer Center will be done in the  Main lab at [facility] on 1st floor. If you have a lab appointment with the Cancer Center please come in thru the  Main Entrance and check in at the main information desk   Today you received the following chemotherapy agents   To help prevent nausea and vomiting after your treatment, we encourage you to take your nausea medication     If you develop nausea and vomiting, or diarrhea that is not controlled by your medication, call the clinic.  The clinic phone number is [phone]. Office hours are Monday-Friday 8:30am-5:00pm.  BELOW ARE SYMPTOMS THAT SHOULD BE REPORTED IMMEDIATELY:  *FEVER GREATER THAN 101.0 F  *CHILLS WITH OR WITHOUT FEVER  NAUSEA AND VOMITING THAT IS NOT CONTROLLED WITH YOUR NAUSEA MEDICATION  *UNUSUAL SHORTNESS OF BREATH  *UNUSUAL BRUISING OR BLEEDING  TENDERNESS IN MOUTH AND THROAT WITH OR WITHOUT PRESENCE OF ULCERS  *URINARY PROBLEMS  *BOWEL PROBLEMS  UNUSUAL RASH Items with * indicate a potential emergency and should be followed up as soon as possible. If you have an emergency after office hours please contact your primary care physician or go to the nearest emergency department.  Please call the clinic during office hours if you have any questions or concerns.   You may also contact the Patient Navigator at [phone] should you have any questions or need assistance in obtaining follow up care.      Resources For Cancer Patients and their Caregivers ? American Cancer Society: Can assist with transportation, wigs, general needs, runs Look Good Feel Better.        1-888-227-6333 ? Cancer Care: Provides financial assistance, online support groups, medication/co-pay assistance.  1-800-813-HOPE (4673) ? [facility] Assists [place] cancer  patients and their families through emotional , educational and financial support.  336-427-4357 ? [place] DSS Where to apply for food stamps, Medicaid and utility assistance. 336-342-1394 ? [facility]: Transportation to medical appointments. 336-347-2287 ? Social Security Administration: May apply for disability if have a Stage IV cancer. 336-342-7796 1-800-772-1213 ? [place] [facility]ging, Disability and Transit Services: Assists with nutrition, care and transit needs. 336-349-2343         

## 2017-05-29 ENCOUNTER — Other Ambulatory Visit: Payer: Self-pay | Admitting: Family Medicine

## 2017-05-29 ENCOUNTER — Other Ambulatory Visit (HOSPITAL_COMMUNITY): Payer: Self-pay | Admitting: Oncology

## 2017-05-29 DIAGNOSIS — N400 Enlarged prostate without lower urinary tract symptoms: Secondary | ICD-10-CM

## 2017-05-29 NOTE — Telephone Encounter (Signed)
Ok to refill 

## 2017-06-03 ENCOUNTER — Inpatient Hospital Stay (HOSPITAL_COMMUNITY): Payer: Medicare Other

## 2017-06-03 ENCOUNTER — Other Ambulatory Visit: Payer: Self-pay

## 2017-06-03 ENCOUNTER — Inpatient Hospital Stay (HOSPITAL_COMMUNITY): Payer: Medicare Other | Attending: Hematology

## 2017-06-03 ENCOUNTER — Encounter (HOSPITAL_COMMUNITY): Payer: Self-pay

## 2017-06-03 VITALS — BP 124/59 | HR 88 | Temp 98.1°F | Resp 18 | Wt 247.8 lb

## 2017-06-03 DIAGNOSIS — Z5112 Encounter for antineoplastic immunotherapy: Secondary | ICD-10-CM | POA: Insufficient documentation

## 2017-06-03 DIAGNOSIS — M549 Dorsalgia, unspecified: Secondary | ICD-10-CM | POA: Diagnosis not present

## 2017-06-03 DIAGNOSIS — C9000 Multiple myeloma not having achieved remission: Secondary | ICD-10-CM

## 2017-06-03 LAB — CBC WITH DIFFERENTIAL/PLATELET
Basophils Absolute: 0 10*3/uL (ref 0.0–0.1)
Basophils Relative: 0 %
Eosinophils Absolute: 0 10*3/uL (ref 0.0–0.7)
Eosinophils Relative: 0 %
HCT: 42 % (ref 39.0–52.0)
Hemoglobin: 14.2 g/dL (ref 13.0–17.0)
Lymphocytes Relative: 13 %
Lymphs Abs: 1 10*3/uL (ref 0.7–4.0)
MCH: 31.8 pg (ref 26.0–34.0)
MCHC: 33.8 g/dL (ref 30.0–36.0)
MCV: 94 fL (ref 78.0–100.0)
Monocytes Absolute: 0.5 10*3/uL (ref 0.1–1.0)
Monocytes Relative: 7 %
Neutro Abs: 6.3 10*3/uL (ref 1.7–7.7)
Neutrophils Relative %: 80 %
Platelets: 123 10*3/uL — ABNORMAL LOW (ref 150–400)
RBC: 4.47 MIL/uL (ref 4.22–5.81)
RDW: 13.2 % (ref 11.5–15.5)
WBC: 7.9 10*3/uL (ref 4.0–10.5)

## 2017-06-03 LAB — COMPREHENSIVE METABOLIC PANEL
ALT: 15 U/L — ABNORMAL LOW (ref 17–63)
AST: 18 U/L (ref 15–41)
Albumin: 3.7 g/dL (ref 3.5–5.0)
Alkaline Phosphatase: 29 U/L — ABNORMAL LOW (ref 38–126)
Anion gap: 11 (ref 5–15)
BUN: 21 mg/dL — ABNORMAL HIGH (ref 6–20)
CO2: 23 mmol/L (ref 22–32)
Calcium: 9 mg/dL (ref 8.9–10.3)
Chloride: 106 mmol/L (ref 101–111)
Creatinine, Ser: 0.88 mg/dL (ref 0.61–1.24)
GFR calc Af Amer: >60 mL/min (ref >60)
GFR calc non Af Amer: >60 mL/min (ref >60)
Glucose, Bld: 108 mg/dL — ABNORMAL HIGH (ref 65–99)
Potassium: 4.4 mmol/L (ref 3.5–5.1)
Sodium: 140 mmol/L (ref 135–145)
Total Bilirubin: 0.7 mg/dL (ref 0.3–1.2)
Total Protein: 6.2 g/dL — ABNORMAL LOW (ref 6.5–8.1)

## 2017-06-03 LAB — LACTATE DEHYDROGENASE: LDH: 156 U/L (ref 98–192)

## 2017-06-03 MED ORDER — BORTEZOMIB CHEMO SQ INJECTION 3.5 MG (2.5MG/ML)
1.3000 mg/m2 | Freq: Once | INTRAMUSCULAR | Status: AC
  Administered 2017-06-03: 3 mg via SUBCUTANEOUS
  Filled 2017-06-03: qty 3

## 2017-06-03 MED ORDER — ZOLEDRONIC ACID 4 MG/100ML IV SOLN
4.0000 mg | Freq: Once | INTRAVENOUS | Status: AC
  Administered 2017-06-03: 4 mg via INTRAVENOUS
  Filled 2017-06-03: qty 100

## 2017-06-03 MED ORDER — SODIUM CHLORIDE 0.9 % IV SOLN
INTRAVENOUS | Status: DC
  Administered 2017-06-03: 15:00:00 via INTRAVENOUS

## 2017-06-03 MED ORDER — PROCHLORPERAZINE MALEATE 10 MG PO TABS
10.0000 mg | ORAL_TABLET | Freq: Once | ORAL | Status: AC
  Administered 2017-06-03: 10 mg via ORAL
  Filled 2017-06-03: qty 1

## 2017-06-03 NOTE — Patient Instructions (Signed)
[facility] Discharge Instructions for Patients Receiving Chemotherapy   Beginning January 23rd 2017 lab work for the Cancer Center will be done in the  Main lab at [facility] on 1st floor. If you have a lab appointment with the Cancer Center please come in thru the  Main Entrance and check in at the main information desk   Today you received the following chemotherapy agents   To help prevent nausea and vomiting after your treatment, we encourage you to take your nausea medication     If you develop nausea and vomiting, or diarrhea that is not controlled by your medication, call the clinic.  The clinic phone number is [phone]. Office hours are Monday-Friday 8:30am-5:00pm.  BELOW ARE SYMPTOMS THAT SHOULD BE REPORTED IMMEDIATELY:  *FEVER GREATER THAN 101.0 F  *CHILLS WITH OR WITHOUT FEVER  NAUSEA AND VOMITING THAT IS NOT CONTROLLED WITH YOUR NAUSEA MEDICATION  *UNUSUAL SHORTNESS OF BREATH  *UNUSUAL BRUISING OR BLEEDING  TENDERNESS IN MOUTH AND THROAT WITH OR WITHOUT PRESENCE OF ULCERS  *URINARY PROBLEMS  *BOWEL PROBLEMS  UNUSUAL RASH Items with * indicate a potential emergency and should be followed up as soon as possible. If you have an emergency after office hours please contact your primary care physician or go to the nearest emergency department.  Please call the clinic during office hours if you have any questions or concerns.   You may also contact the Patient Navigator at [phone] should you have any questions or need assistance in obtaining follow up care.      Resources For Cancer Patients and their Caregivers ? American Cancer Society: Can assist with transportation, wigs, general needs, runs Look Good Feel Better.        1-888-227-6333 ? Cancer Care: Provides financial assistance, online support groups, medication/co-pay assistance.  1-800-813-HOPE (4673) ? [facility] Assists [place] cancer  patients and their families through emotional , educational and financial support.  336-427-4357 ? [place] DSS Where to apply for food stamps, Medicaid and utility assistance. 336-342-1394 ? [facility]: Transportation to medical appointments. 336-347-2287 ? Social Security Administration: May apply for disability if have a Stage IV cancer. 336-342-7796 1-800-772-1213 ? [place] [facility]ging, Disability and Transit Services: Assists with nutrition, care and transit needs. 336-349-2343         

## 2017-06-03 NOTE — Progress Notes (Signed)
Labs reviewed by Dr. Delton Coombes today. Will proceed with velcade and zometa today.

## 2017-06-17 ENCOUNTER — Inpatient Hospital Stay (HOSPITAL_COMMUNITY): Payer: Medicare Other

## 2017-06-17 ENCOUNTER — Encounter (HOSPITAL_COMMUNITY): Payer: Self-pay

## 2017-06-17 VITALS — BP 137/81 | HR 88 | Temp 97.5°F | Resp 18 | Wt 248.2 lb

## 2017-06-17 DIAGNOSIS — M549 Dorsalgia, unspecified: Secondary | ICD-10-CM | POA: Diagnosis not present

## 2017-06-17 DIAGNOSIS — Z5112 Encounter for antineoplastic immunotherapy: Secondary | ICD-10-CM | POA: Diagnosis not present

## 2017-06-17 DIAGNOSIS — C9000 Multiple myeloma not having achieved remission: Secondary | ICD-10-CM

## 2017-06-17 LAB — CBC WITH DIFFERENTIAL/PLATELET
Basophils Absolute: 0 10*3/uL (ref 0.0–0.1)
Basophils Relative: 0 %
Eosinophils Absolute: 0 10*3/uL (ref 0.0–0.7)
Eosinophils Relative: 0 %
HCT: 43.4 % (ref 39.0–52.0)
Hemoglobin: 14.3 g/dL (ref 13.0–17.0)
Lymphocytes Relative: 11 %
Lymphs Abs: 0.7 10*3/uL (ref 0.7–4.0)
MCH: 30.8 pg (ref 26.0–34.0)
MCHC: 32.9 g/dL (ref 30.0–36.0)
MCV: 93.5 fL (ref 78.0–100.0)
Monocytes Absolute: 0.1 10*3/uL (ref 0.1–1.0)
Monocytes Relative: 2 %
Neutro Abs: 5.7 10*3/uL (ref 1.7–7.7)
Neutrophils Relative %: 87 %
Platelets: 111 10*3/uL — ABNORMAL LOW (ref 150–400)
RBC: 4.64 MIL/uL (ref 4.22–5.81)
RDW: 13 % (ref 11.5–15.5)
WBC: 6.5 10*3/uL (ref 4.0–10.5)

## 2017-06-17 LAB — COMPREHENSIVE METABOLIC PANEL
ALT: 14 U/L — ABNORMAL LOW (ref 17–63)
AST: 23 U/L (ref 15–41)
Albumin: 3.7 g/dL (ref 3.5–5.0)
Alkaline Phosphatase: 33 U/L — ABNORMAL LOW (ref 38–126)
Anion gap: 9 (ref 5–15)
BUN: 19 mg/dL (ref 6–20)
CO2: 24 mmol/L (ref 22–32)
Calcium: 8.8 mg/dL — ABNORMAL LOW (ref 8.9–10.3)
Chloride: 104 mmol/L (ref 101–111)
Creatinine, Ser: 0.84 mg/dL (ref 0.61–1.24)
GFR calc Af Amer: >60 mL/min (ref >60)
GFR calc non Af Amer: >60 mL/min (ref >60)
Glucose, Bld: 128 mg/dL — ABNORMAL HIGH (ref 65–99)
Potassium: 4.1 mmol/L (ref 3.5–5.1)
Sodium: 137 mmol/L (ref 135–145)
Total Bilirubin: 0.5 mg/dL (ref 0.3–1.2)
Total Protein: 6.4 g/dL — ABNORMAL LOW (ref 6.5–8.1)

## 2017-06-17 MED ORDER — PROCHLORPERAZINE MALEATE 10 MG PO TABS
10.0000 mg | ORAL_TABLET | Freq: Once | ORAL | Status: AC
  Administered 2017-06-17: 10 mg via ORAL

## 2017-06-17 MED ORDER — BORTEZOMIB CHEMO SQ INJECTION 3.5 MG (2.5MG/ML)
1.3000 mg/m2 | Freq: Once | INTRAMUSCULAR | Status: AC
  Administered 2017-06-17: 3 mg via SUBCUTANEOUS
  Filled 2017-06-17: qty 3

## 2017-06-17 NOTE — Progress Notes (Signed)
Dean [name]. tolerated Velcade injection well without complaints or incident. VSS Pt discharged self ambulatory using his cane in satisfactory condition accompanied by his wife

## 2017-06-17 NOTE — Patient Instructions (Signed)
[facility] Discharge Instructions for Patients Receiving Chemotherapy   Beginning January 23rd 2017 lab work for the Cancer Center will be done in the  Main lab at [facility] on 1st floor. If you have a lab appointment with the Cancer Center please come in thru the  Main Entrance and check in at the main information desk   Today you received the following chemotherapy agents Velcade injection. Follow-up as scheduled. Call clinic for any questions or concerns  To help prevent nausea and vomiting after your treatment, we encourage you to take your nausea medication   If you develop nausea and vomiting, or diarrhea that is not controlled by your medication, call the clinic.  The clinic phone number is [phone]. Office hours are Monday-Friday 8:30am-5:00pm.  BELOW ARE SYMPTOMS THAT SHOULD BE REPORTED IMMEDIATELY:  *FEVER GREATER THAN 101.0 F  *CHILLS WITH OR WITHOUT FEVER  NAUSEA AND VOMITING THAT IS NOT CONTROLLED WITH YOUR NAUSEA MEDICATION  *UNUSUAL SHORTNESS OF BREATH  *UNUSUAL BRUISING OR BLEEDING  TENDERNESS IN MOUTH AND THROAT WITH OR WITHOUT PRESENCE OF ULCERS  *URINARY PROBLEMS  *BOWEL PROBLEMS  UNUSUAL RASH Items with * indicate a potential emergency and should be followed up as soon as possible. If you have an emergency after office hours please contact your primary care physician or go to the nearest emergency department.  Please call the clinic during office hours if you have any questions or concerns.   You may also contact the Patient Navigator at [phone] should you have any questions or need assistance in obtaining follow up care.      Resources For Cancer Patients and their Caregivers ? American Cancer Society: Can assist with transportation, wigs, general needs, runs Look Good Feel Better.        1-888-227-6333 ? Cancer Care: Provides financial assistance, online support groups, medication/co-pay assistance.   1-800-813-HOPE (4673) ? [facility] Assists [place] cancer patients and their families through emotional , educational and financial support.  336-427-4357 ? [place] DSS Where to apply for food stamps, Medicaid and utility assistance. 336-342-1394 ? [facility]: Transportation to medical appointments. 336-347-2287 ? Social Security Administration: May apply for disability if have a Stage IV cancer. 336-342-7796 1-800-772-1213 ? [place] [facility]ging, Disability and Transit Services: Assists with nutrition, care and transit needs. 336-349-2343         

## 2017-06-18 ENCOUNTER — Other Ambulatory Visit (HOSPITAL_COMMUNITY): Payer: Medicare Other

## 2017-06-18 LAB — KAPPA/LAMBDA LIGHT CHAINS
Kappa free light chain: 33 mg/L — ABNORMAL HIGH (ref 3.3–19.4)
Kappa, lambda light chain ratio: 2.5 — ABNORMAL HIGH (ref 0.26–1.65)
Lambda free light chains: 13.2 mg/L (ref 5.7–26.3)

## 2017-06-19 LAB — PROTEIN ELECTROPHORESIS, SERUM
A/G Ratio: 1.4 (ref 0.7–1.7)
Albumin ELP: 3.3 g/dL (ref 2.9–4.4)
Alpha-1-Globulin: 0.2 g/dL (ref 0.0–0.4)
Alpha-2-Globulin: 0.8 g/dL (ref 0.4–1.0)
Beta Globulin: 0.8 g/dL (ref 0.7–1.3)
Gamma Globulin: 0.5 g/dL (ref 0.4–1.8)
Globulin, Total: 2.3 g/dL (ref 2.2–3.9)
M-Spike, %: 0.3 g/dL — ABNORMAL HIGH
Total Protein ELP: 5.6 g/dL — ABNORMAL LOW (ref 6.0–8.5)

## 2017-06-26 ENCOUNTER — Other Ambulatory Visit: Payer: Self-pay | Admitting: Family Medicine

## 2017-06-26 DIAGNOSIS — C9000 Multiple myeloma not having achieved remission: Secondary | ICD-10-CM

## 2017-06-26 DIAGNOSIS — G4700 Insomnia, unspecified: Secondary | ICD-10-CM

## 2017-06-26 NOTE — Telephone Encounter (Signed)
Ok to refill??  Last office visit 02/04/2017.  Last refill on Xanax 04/27/2017.  Last refill on Tramadol 05/29/2017.

## 2017-06-30 ENCOUNTER — Encounter: Payer: Self-pay | Admitting: Family Medicine

## 2017-06-30 ENCOUNTER — Ambulatory Visit (INDEPENDENT_AMBULATORY_CARE_PROVIDER_SITE_OTHER): Payer: Medicare Other | Admitting: Family Medicine

## 2017-06-30 VITALS — BP 122/70 | HR 68 | Temp 98.1°F | Resp 16 | Ht 69.0 in | Wt 244.0 lb

## 2017-06-30 DIAGNOSIS — M48061 Spinal stenosis, lumbar region without neurogenic claudication: Secondary | ICD-10-CM | POA: Diagnosis not present

## 2017-06-30 MED ORDER — HYDROCODONE-ACETAMINOPHEN 5-325 MG PO TABS: 1.0000 | ORAL_TABLET | Freq: Four times a day (QID) | ORAL | 0 refills | Status: DC | PRN

## 2017-06-30 NOTE — Progress Notes (Signed)
Subjective:    Patient ID: Dean [name]., male    DOB: 02-18-1935, 82 y.o.   MRN: [id number]  HPI Patient is a very pleasant 82 year old Caucasian male who is battling multiple myeloma.  Patient had an MRI of the lumbar spine performed in September 2018 that revealed moderate to severe spinal stenosis at L3 and L4 as well as L4 and L5.  Also showed a vertebral fracture as well as a region of active multiple myeloma in his left sacrum.  I have copied the MRI report below for my reference: IMPRESSION: 1. A 16 mm active multiple myeloma lesion is suspected in the left S2 sacral ala. 2. But otherwise regression of multiple myeloma since 2016 is demonstrated throughout the lumbar spine and sacrum. 3. Chronic compression fractures of L3 and L4. Superimposed progression of spinal degeneration at both of those levels since 2016, with mild associated degenerative endplate marrow edema, and: - L3-L4 moderate to severe spinal stenosis, severe left lateral recess stenosis, and moderate to severe bilateral foraminal stenosis. -L4-L5 new mild spinal stenosis, and increased moderate to severe left lateral recess and left neural foraminal stenosis.  Patient was originally prescribed oxycodone however he cannot tolerate the medication because it makes him too groggy and drowsy and he does not like the way it makes him feel.  He was on gabapentin however gabapentin did not help his pain he did not like the dizziness and sedation that it caused.  He states that he is having chronic pain that radiates into both hips down the anterior of both legs wraps around his knee and goes into his calves bilaterally.  The pain is described as a deep aching pain that comes and goes but is present the majority of the day.  He also reports increased falling and weakness in his legs.  He denies any saddle anesthesia.  He denies any bowel or bladder incontinence. Past Medical History:  Diagnosis Date  . BPH (benign  prostatic hyperplasia)   . Colonic diverticular abscess 01/08/2015  . Diverticulitis 40/08/67   complicated by abscess and required percutaneous drainage  . GERD (gastroesophageal reflux disease)   . H/O ETOH abuse   . History of chemotherapy last done jan 2017  . History of radiation therapy 01/05/13-02/10/13   45 gray to left occipital condyle region  . History of radiation therapy 12/01/16-12/10/16   Parasternal nodule, chest- 24 Gy total delivered in 8 fractions, Left sacro-iliac, pelvis- 24 Gy total delivered in 8 fractions   . History of radiation therapy 02/19/17-02/22/17   right temporal scalp 30 Gy in 10 fractions  . Intra-abdominal abscess ([place])   . Multiple myeloma ([place]) 2015  . Neuropathy   . Radiation 02/21/14-03/08/14   right posterior chest wall area 30 gray  . Radiation 04/19/14-05/02/14   lumbar spine 25 gray  . Skull lesion    Left occipital condyle  . Wrist fracture, left    x 2   Past Surgical History:  Procedure Laterality Date  . COLONOSCOPY N/A 04/19/2015   Procedure: COLONOSCOPY;  Surgeon: Danie Binder, MD;  Location: [facility];  Service: Endoscopy;  Laterality: N/A;  1:30 PM  . CYST EXCISION  1959   tail bone   Current Outpatient Medications on File Prior to Visit  Medication Sig Dispense Refill  . acyclovir (ZOVIRAX) 400 MG tablet Take 1 tablet (400 mg total) by mouth 2 (two) times daily. 60 tablet 3  . ALPRAZolam (XANAX) 1 MG tablet TAKE 2 TABLETS  BY MOUTH AT BEDTIME AS NEEDED FOR ANXIETY AND OR SLEEP 60 tablet 0  . aspirin EC 325 MG tablet Take 1 tablet (325 mg total) by mouth daily. 30 tablet 11  . bortezomib CALGB 24580 (VELCADE) 3.5 MG SOLR Inject 1.6 mg/m2 into the vein every 21 ( twenty-one) days.    Marland Kitchen CALCIUM-VITAMIN D PO Take by mouth. 1500/1000 PO QD    . dexamethasone (DECADRON) 4 MG tablet Take 2.5 tablets (10 mg total) by mouth 2 (two) times a week. Take on Tue/Fri every week, do not start until bortezomib is initiated (Patient taking  differently: Take 10 mg by mouth 2 (two) times a week. Take 5 tablets on the day of your velcade injection-per pt.) 60 tablet 0  . gabapentin (NEURONTIN) 400 MG capsule TAKE 5 CAPSULES BY MOUTH DAILY 150 capsule 3  . ibuprofen (ADVIL,MOTRIN) 800 MG tablet TAKE 1 TABLET BY MOUTH EVERY 8 HOURS AS NEEDED FOR MODERATE PAIN. 90 tablet 2  . lidocaine-prilocaine (EMLA) cream Apply to affected area once 30 g 3  . omeprazole (PRILOSEC) 20 MG capsule Take 1 capsule (20 mg total) by mouth daily. 30 capsule 5  . tamsulosin (FLOMAX) 0.4 MG CAPS capsule TAKE ONE CAPSULE BY MOUTH AT BEDTIME 30 capsule 3  . zolendronic acid (ZOMETA) 4 MG/5ML injection Inject 4 mg into the vein. QOMonth     Current Facility-Administered Medications on File Prior to Visit  Medication Dose Route Frequency Provider Last Rate Last Dose  . sodium chloride 0.9 % injection 10 mL  10 mL Intracatheter PRN B[facility], PA-C   10 mL at 08/04/14 1103   Allergies  Allergen Reactions  . Codeine Other (See Comments)    Severe headache  . Morphine And Related Other (See Comments)    Makes him feel weird  . Revlimid [Lenalidomide] Other (See Comments)    "Causes me to become weak"   Social History   Socioeconomic History  . Marital status: Married    Spouse name: Not on file  . Number of children: 3  . Years of education: Not on file  . Highest education level: Not on file  Occupational History    Employer: RETIRED  Social Needs  . Financial resource strain: Not on file  . Food insecurity:    Worry: Not on file    Inability: Not on file  . Transportation needs:    Medical: Not on file    Non-medical: Not on file  Tobacco Use  . Smoking status: Never Smoker  . Smokeless tobacco: Never Used  Substance and Sexual Activity  . Alcohol use: No    Comment: " Not much no more"  . Drug use: No  . Sexual activity: Not on file  Lifestyle  . Physical activity:    Days per week: Not on file    Minutes per session: Not on  file  . Stress: Not on file  Relationships  . Social connections:    Talks on phone: Not on file    Gets together: Not on file    Attends religious service: Not on file    Active member of club or organization: Not on file    Attends meetings of clubs or organizations: Not on file    Relationship status: Not on file  . Intimate partner violence:    Fear of current or ex partner: Not on file    Emotionally abused: Not on file    Physically abused: Not on file  Forced sexual activity: Not on file  Other Topics Concern  . Not on file  Social History Narrative  . Not on file      Review of Systems  All other systems reviewed and are negative.      Objective:   Physical Exam  Constitutional: He is oriented to person, place, and time.  Cardiovascular: Normal rate, regular rhythm and normal heart sounds.  Pulmonary/Chest: Effort normal and breath sounds normal.  Neurological: He is alert and oriented to person, place, and time. He displays normal reflexes. No cranial nerve deficit. He exhibits abnormal muscle tone. Coordination normal.  Vitals reviewed.         Assessment & Plan:  Spinal stenosis of lumbar region at multiple levels - Plan: HYDROcodone-acetaminophen (NORCO) 5-325 MG tablet, Ambulatory referral to Orthopedic Surgery  I believe the majority of his pain is stemming from the severe spinal stenosis at L3-L4 coupled with his vertebral fracture and the foraminal stenosis as well.  Patient is not a surgical candidate given his age and cancer and ongoing chemotherapy however he may benefit from epidural steroid injections.  Patient is willing to discuss this with orthopedics.  In the meantime we will try hydrocodone 5/325 1 p.o. every 6 hours as needed pain.  If this does not work, we could try the patient on Lyrica for neuropathic pain.  Spent more than 20 minutes with the patient reviewing his MRI findings and explaining his diagnosis and treatment options

## 2017-07-01 ENCOUNTER — Inpatient Hospital Stay (HOSPITAL_COMMUNITY): Payer: Medicare Other

## 2017-07-01 ENCOUNTER — Encounter (HOSPITAL_COMMUNITY): Payer: Self-pay | Admitting: Hematology

## 2017-07-01 ENCOUNTER — Inpatient Hospital Stay (HOSPITAL_BASED_OUTPATIENT_CLINIC_OR_DEPARTMENT_OTHER): Payer: Medicare Other | Admitting: Hematology

## 2017-07-01 DIAGNOSIS — Z923 Personal history of irradiation: Secondary | ICD-10-CM

## 2017-07-01 DIAGNOSIS — C9000 Multiple myeloma not having achieved remission: Secondary | ICD-10-CM | POA: Diagnosis not present

## 2017-07-01 DIAGNOSIS — Z5112 Encounter for antineoplastic immunotherapy: Secondary | ICD-10-CM | POA: Diagnosis not present

## 2017-07-01 DIAGNOSIS — G620 Drug-induced polyneuropathy: Secondary | ICD-10-CM

## 2017-07-01 DIAGNOSIS — R5383 Other fatigue: Secondary | ICD-10-CM | POA: Diagnosis not present

## 2017-07-01 DIAGNOSIS — M549 Dorsalgia, unspecified: Secondary | ICD-10-CM | POA: Diagnosis not present

## 2017-07-01 DIAGNOSIS — T451X5A Adverse effect of antineoplastic and immunosuppressive drugs, initial encounter: Secondary | ICD-10-CM

## 2017-07-01 LAB — COMPREHENSIVE METABOLIC PANEL
ALT: 18 U/L (ref 17–63)
AST: 20 U/L (ref 15–41)
Albumin: 3.8 g/dL (ref 3.5–5.0)
Alkaline Phosphatase: 23 U/L — ABNORMAL LOW (ref 38–126)
Anion gap: 9 (ref 5–15)
BUN: 35 mg/dL — ABNORMAL HIGH (ref 6–20)
CO2: 23 mmol/L (ref 22–32)
Calcium: 9.2 mg/dL (ref 8.9–10.3)
Chloride: 105 mmol/L (ref 101–111)
Creatinine, Ser: 0.75 mg/dL (ref 0.61–1.24)
GFR calc Af Amer: >60 mL/min (ref >60)
GFR calc non Af Amer: >60 mL/min (ref >60)
Glucose, Bld: 121 mg/dL — ABNORMAL HIGH (ref 65–99)
Potassium: 4.3 mmol/L (ref 3.5–5.1)
Sodium: 137 mmol/L (ref 135–145)
Total Bilirubin: 0.7 mg/dL (ref 0.3–1.2)
Total Protein: 6.6 g/dL (ref 6.5–8.1)

## 2017-07-01 LAB — CBC WITH DIFFERENTIAL/PLATELET
Basophils Absolute: 0 10*3/uL (ref 0.0–0.1)
Basophils Relative: 0 %
Eosinophils Absolute: 0 10*3/uL (ref 0.0–0.7)
Eosinophils Relative: 0 %
HCT: 42.8 % (ref 39.0–52.0)
Hemoglobin: 14.6 g/dL (ref 13.0–17.0)
Lymphocytes Relative: 11 %
Lymphs Abs: 0.8 10*3/uL (ref 0.7–4.0)
MCH: 31.9 pg (ref 26.0–34.0)
MCHC: 34.1 g/dL (ref 30.0–36.0)
MCV: 93.4 fL (ref 78.0–100.0)
Monocytes Absolute: 0.2 10*3/uL (ref 0.1–1.0)
Monocytes Relative: 3 %
Neutro Abs: 6.3 10*3/uL (ref 1.7–7.7)
Neutrophils Relative %: 86 %
Platelets: 127 10*3/uL — ABNORMAL LOW (ref 150–400)
RBC: 4.58 MIL/uL (ref 4.22–5.81)
RDW: 13.4 % (ref 11.5–15.5)
WBC: 7.4 10*3/uL (ref 4.0–10.5)

## 2017-07-01 LAB — LACTATE DEHYDROGENASE: LDH: 157 U/L (ref 98–192)

## 2017-07-01 MED ORDER — PROCHLORPERAZINE MALEATE 10 MG PO TABS
10.0000 mg | ORAL_TABLET | Freq: Once | ORAL | Status: AC
  Administered 2017-07-01: 10 mg via ORAL
  Filled 2017-07-01: qty 1

## 2017-07-01 MED ORDER — BORTEZOMIB CHEMO SQ INJECTION 3.5 MG (2.5MG/ML)
1.3000 mg/m2 | Freq: Once | INTRAMUSCULAR | Status: AC
  Administered 2017-07-01: 3 mg via SUBCUTANEOUS
  Filled 2017-07-01: qty 3

## 2017-07-01 NOTE — Assessment & Plan Note (Signed)
1.  IgG kappa multiple myeloma: - Initially treated with RVD from 04/07/2014 through 07/28/2014, placed on maintenance Revlimid, which was discontinued 1 and half year ago, unknown reasons, patient thinks it was discontinued when he was receiving radiation. -MRI in January of the brain showed new right temporal lesion, status post radiation therapy, PET/CT scan in January 2019 showed improvement of multiple myeloma with no new lesions, started on weekly Velcade and dexamethasone on 02/18/2017, status post 2 cycles, Velcade changed to every other week on 05/06/2017 secondary to neuropathy, takes dexamethasone 20 mg on days of Velcade. - We discussed the results of SPEP.  M spike was 0.3 (06/17/2017), 0.2 (05/06/2017),K/L ratio of 2.5 down from 3.04, kappa light chains of 33. -He is tolerating Velcade every other week very well.  His neuropathy is stable.  He is not taking acyclovir.  I have instructed him to take acyclovir twice daily for shingles prophylaxis.  As there is only minimal increase in M spike, I would not make any changes at this time.  He will continue Velcade every other week.  I plan to repeat his myeloma work-up in 2 months.  He reportedly fell few weeks ago at home.  He has some back pain for which she is seeing an orthopedic doctor.  2.  Peripheral neuropathy: He has numbness and tingling in his feet and legs, extending up to his knees.  This has been stable since last visit when we cut back on Velcade.  He is taking Neurontin twice daily.  If he takes more, he gets lightheadedness and forgetfulness.  Dr. Pickard reportedly gave him some narcotic prescription a week ago.  He does not know the name of it.  3.  Bone protection: -he is receiving Zometa every 8 weeks.  Tolerating it well. 

## 2017-07-01 NOTE — Progress Notes (Signed)
Okay for tx today per Dr. Delton Coombes after review of labs and examination.  Will continue with Zometa q 8 weeks per MD.   Dean [name]. presents today for injection per the provider's orders.  Velcade administration without incident; see MAR for injection details.  Patient tolerated procedure well and without incident.  No questions or complaints noted at this time. Discharged ambulatory in c/o spouse.

## 2017-07-01 NOTE — Patient Instructions (Signed)
[facility] at [facility]  Discharge Instructions:  You were seen by Dr. K today _______________________________________________________________  Thank you for choosing [facility] at [facility] to provide your oncology and hematology care.  To afford each patient quality time with our providers, please arrive at least 15 minutes before your scheduled appointment.  You need to re-schedule your appointment if you arrive 10 or more minutes late.  We strive to give you quality time with our providers, and arriving late affects you and other patients whose appointments are after yours.  Also, if you no show three or more times for appointments you may be dismissed from the clinic.  Again, thank you for choosing [facility] at [facility]. Our hope is that these requests will allow you access to exceptional care and in a timely manner. _______________________________________________________________  If you have questions after your visit, please contact our office at [phone] between the hours of 8:30 a.m. and 5:00 p.m. Voicemails left after 4:30 p.m. will not be returned until the following business day. _______________________________________________________________  For prescription refill requests, have your pharmacy contact our office. _______________________________________________________________  Recommendations made by the consultant and any test results will be sent to your referring physician. _______________________________________________________________ 

## 2017-07-01 NOTE — Progress Notes (Signed)
Dean [name], [place] [address]   CLINIC:  Medical Oncology/Hematology  PCP:  Dean Frizzle, MD [address] [place] [address] [phone]   REASON FOR VISIT:  Follow-up for multiple myeloma.  CURRENT THERAPY: Velcade every other week with dexamethasone.  BRIEF ONCOLOGIC HISTORY:    Multiple myeloma without remission ([place])   11/16/2012 Initial Diagnosis    L occipital condyle destructive lesion, not biopsied secondary to location. XRT      12/01/2012 Imaging    L3 superior endplate compression FX, no lytic lesions to suggest myeloma      02/06/2014 Imaging    soft tissue mass along the right posterior fifth rib with some rib destruction      02/16/2014 Miscellaneous    Normal CBC, Normal CMP, kappa,lamda ratio of 2.62 (abnormal), UIEP with slightly restricted band, IgG kappa, SPEP/IEP with monoclonal protein at 0.79 g/dl, normal igG, suppressed IGM      02/20/2014 Bone Marrow Biopsy    33% kappa restricted plasma cells Normal FISH, normal cytogenetics      02/21/2014 - 03/08/2014 Radiation Therapy    30Gy to R rib lesion, lesion not biopsied      03/16/2014 PET scan    Scattered hypermetabolic osseous lesions, including the calvarium, ribs, left scapula, sacrum, and left femoral shaft. An expansile left vertebral body lesion at L3 extends into the epidural space of the left lateral recess,       03/17/2014 - 04/07/2014 Chemotherapy    Velcade and Dexamethasone due to initial denial of Revlimid by insurance company.  Zometa monthly.      03/22/2014 Imaging    MR_ L-Spine- Enhancing lesions compatible with multiple myeloma at L2, L3, and S1-2.      04/07/2014 - 07/28/2014 Chemotherapy    RVD with Revlimid at 25 mg days 1-14.  Revlimid dose reduced to 25 mg every other day x 14 days with 7 day respite beginning on day 1 of cycle 2.      04/17/2014 Adverse Reaction    Revlimid-induced rash.  Treated with steroids.      07/28/2014 - 08/04/2014 Chemotherapy    Revlimid daily      08/04/2014 Adverse Reaction    Velcade-induced peripheral neuropathy      08/04/2014 Treatment Plan Change    D/C Velcade      08/11/2014 PET scan    Response to therapy. Improvement and resolution of foci of osseous hypermetabolism.      08/22/2014 - 08/28/2014 Chemotherapy    Revlimid 25 mg days 1-21 every 28 days      08/28/2014 Adverse Reaction    Revlimid-induced rash. Medication held.  Medrol dose Pak prescribed.      09/04/2014 - 01/08/2015 Chemotherapy    Revlimid 10 mg every other day, without dexamethasone      01/08/2015 - 01/15/2015 Hospital Admission    Colonic diverticular abscess with IR drain placement      03/02/2015 PET scan    Only bony uptake is low activity at site of deformity/callus at R second rib FX, high activity in sigmoid colon, advanced sigmoid divertidulosis. Abscess not resolved?      06/04/2015 Imaging    CT nonobstructive L renal calculus, diverticulosis of descending and sigmoid colon without inflammation, moderate prostatic enlargement      07/12/2015 Surgery    Diverticulitis s/p robotic sigmoid colectomy with Dr. Johney [name]      08/23/2015 PET scan  Primarily similar hypermetabolic osseous foci within the R sided ribs. new L third rib focus of hypermetab and sclerosis is favored to be related to healing FX. no soft tissue myeloma id'd. Presumed postop hypermetab and edema about the R pelvic wall       03/06/2016 PET scan    1. Reduced activity in the prior lesion such as the right second and fifth rib lesions, activity nearly completely resolved and significantly less than added mediastinal blood pool activity. No new lesions are identified. 2. Other imaging findings of potential clinical significance: Coronary, aortic arch, and branch vessel atherosclerotic vascular disease. Aortoiliac atherosclerotic vascular disease. Enlarged prostate gland. Colonic diverticula.      02/17/2017 PET scan      HEAD/NECK: No hypermetabolic activity in the scalp. No hypermetabolic cervical lymph nodes. CHEST: No hypermetabolic mediastinal or hilar nodes. Right upper lobe scarring/atelectasis. No suspicious pulmonary nodules on the CT scan. ABDOMEN/PELVIS: No abnormal hypermetabolic activity within the liver, pancreas, adrenal glands, or spleen. No hypermetabolic lymph nodes in the abdomen or pelvis. Atherosclerotic calcifications of the abdominal aorta and branch vessels. Left renal sinus cysts. Sigmoid diverticulosis, without evidence of diverticulitis. Prostatomegaly. SKELETON: Vague hypermetabolism involving the inferior sternum, max SUV 3.2, previously 8.2. Focal hypermetabolism involving the left sacrum, max SUV 4.0, previously 6.7. EXTREMITIES: No abnormal hypermetabolic activity in the lower extremities.      02/18/2017 -  Chemotherapy    Bortezomib 1.58m/m2 QWk + Dexamethasone 190mQTue/Fri --Cycle #1, 02/18/17       02/18/2017 -  Chemotherapy    The patient had bortezomib SQ (VELCADE) chemo injection 3 mg, 1.3 mg/m2 = 3 mg, Subcutaneous,  Once, 5 of 6 cycles Administration: 3 mg (02/18/2017), 3 mg (02/26/2017), 3 mg (03/04/2017), 3 mg (03/11/2017), 3 mg (04/08/2017), 3 mg (03/18/2017), 3 mg (03/25/2017), 3 mg (04/01/2017), 3 mg (05/06/2017), 3 mg (05/20/2017), 3 mg (06/03/2017), 3 mg (06/17/2017)  for chemotherapy treatment.          INTERVAL HISTORY:  [name].o. male returns for routine follow-up and consideration for next cycle of chemotherapy.  His neuropathy has been stable since we switched to Velcade to every other week 2 months ago.  He reportedly fell at home and had some pain at the tailbone.  He has an appointment to see an orthopedic doctor for possible injection.  His mild fatigue is stable.  Denies any worsening of his neuropathy.  He is taking gabapentin twice daily.  Dr. PiDennard Schaumannas given him some narcotic.  He does not remember the name of it.  Denies any nausea, vomiting,  diarrhea or constipation.  No fevers or infections.  No recent hospitalizations.      REVIEW OF SYSTEMS:  Review of Systems  Constitutional: Positive for fatigue.  Musculoskeletal: Positive for back pain.  Neurological: Positive for numbness.  All other systems reviewed and are negative.    PAST MEDICAL/SURGICAL HISTORY:  Past Medical History:  Diagnosis Date  . BPH (benign prostatic hyperplasia)   . Colonic diverticular abscess 01/08/2015  . Diverticulitis 1193/81/01 complicated by abscess and required percutaneous drainage  . GERD (gastroesophageal reflux disease)   . H/O ETOH abuse   . History of chemotherapy last done jan 2017  . History of radiation therapy 01/05/13-02/10/13   45 gray to left occipital condyle region  . History of radiation therapy 12/01/16-12/10/16   Parasternal nodule, chest- 24 Gy total delivered in 8 fractions, Left sacro-iliac, pelvis- 24 Gy total delivered in 8 fractions   .  History of radiation therapy 02/19/17-02/22/17   right temporal scalp 30 Gy in 10 fractions  . Intra-abdominal abscess ([facility])   . Multiple myeloma ([facility]) 2015  . Neuropathy   . Radiation 02/21/14-03/08/14   right posterior chest wall area 30 gray  . Radiation 04/19/14-05/02/14   lumbar spine 25 gray  . Skull lesion    Left occipital condyle  . Wrist fracture, left    x 2   Past Surgical History:  Procedure Laterality Date  . COLONOSCOPY N/A 04/19/2015   Procedure: COLONOSCOPY;  Surgeon: Danie Binder, MD;  Location: [facility];  Service: Endoscopy;  Laterality: N/A;  1:30 PM  . CYST EXCISION  1959   tail bone     SOCIAL HISTORY:  Social History   Socioeconomic History  . Marital status: Married    Spouse name: Not on file  . Number of children: 3  . Years of education: Not on file  . Highest education level: Not on file  Occupational History    Employer: RETIRED  Social Needs  . Financial resource strain: Not on file  . Food insecurity:    Worry: Not on file     Inability: Not on file  . Transportation needs:    Medical: Not on file    Non-medical: Not on file  Tobacco Use  . Smoking status: Never Smoker  . Smokeless tobacco: Never Used  Substance and Sexual Activity  . Alcohol use: No    Comment: " Not much no more"  . Drug use: No  . Sexual activity: Not on file  Lifestyle  . Physical activity:    Days per week: Not on file    Minutes per session: Not on file  . Stress: Not on file  Relationships  . Social connections:    Talks on phone: Not on file    Gets together: Not on file    Attends religious service: Not on file    Active member of club or organization: Not on file    Attends meetings of clubs or organizations: Not on file    Relationship status: Not on file  . Intimate partner violence:    Fear of current or ex partner: Not on file    Emotionally abused: Not on file    Physically abused: Not on file    Forced sexual activity: Not on file  Other Topics Concern  . Not on file  Social History Narrative  . Not on file    FAMILY HISTORY:  Family History  Problem Relation Age of Onset  . Diabetes Mother   . Stroke Mother   . Hypertension Father     CURRENT MEDICATIONS:  Outpatient Encounter Medications as of 07/01/2017  Medication Sig  . acyclovir (ZOVIRAX) 400 MG tablet Take 1 tablet (400 mg total) by mouth 2 (two) times daily.  Marland Kitchen ALPRAZolam (XANAX) 1 MG tablet TAKE 2 TABLETS BY MOUTH AT BEDTIME AS NEEDED FOR ANXIETY AND OR SLEEP  . aspirin EC 325 MG tablet Take 1 tablet (325 mg total) by mouth daily.  . bortezomib CALGB 76734 (VELCADE) 3.5 MG SOLR Inject 1.6 mg/m2 into the vein every 21 ( twenty-one) days.  Marland Kitchen CALCIUM-VITAMIN D PO Take by mouth. 1500/1000 PO QD  . dexamethasone (DECADRON) 4 MG tablet Take 2.5 tablets (10 mg total) by mouth 2 (two) times a week. Take on Tue/Fri every week, do not start until bortezomib is initiated (Patient taking differently: Take 10 mg by mouth 2 (two) times  a week. Take 5 tablets on  the day of your velcade injection-per pt.)  . gabapentin (NEURONTIN) 400 MG capsule TAKE 5 CAPSULES BY MOUTH DAILY  . HYDROcodone-acetaminophen (NORCO) 5-325 MG tablet Take 1 tablet by mouth every 6 (six) hours as needed for moderate pain.  Marland Kitchen ibuprofen (ADVIL,MOTRIN) 800 MG tablet TAKE 1 TABLET BY MOUTH EVERY 8 HOURS AS NEEDED FOR MODERATE PAIN.  Marland Kitchen lidocaine-prilocaine (EMLA) cream Apply to affected area once  . omeprazole (PRILOSEC) 20 MG capsule Take 1 capsule (20 mg total) by mouth daily.  . tamsulosin (FLOMAX) 0.4 MG CAPS capsule TAKE ONE CAPSULE BY MOUTH AT BEDTIME  . zolendronic acid (ZOMETA) 4 MG/5ML injection Inject 4 mg into the vein. QOMonth   Facility-Administered Encounter Medications as of 07/01/2017  Medication  . sodium chloride 0.9 % injection 10 mL    ALLERGIES:  Allergies  Allergen Reactions  . Codeine Other (See Comments)    Severe headache  . Morphine And Related Other (See Comments)    Makes him feel weird  . Revlimid [Lenalidomide] Other (See Comments)    "Causes me to become weak"     PHYSICAL EXAM:  ECOG Performance status: 1  I have reviewed his vitals. Physical Exam Deferred.  LABORATORY DATA:  I have reviewed the labs as listed.  CBC    Component Value Date/Time   WBC 7.4 07/01/2017 0935   RBC 4.58 07/01/2017 0935   HGB 14.6 07/01/2017 0935   HCT 42.8 07/01/2017 0935   PLT 127 (L) 07/01/2017 0935   MCV 93.4 07/01/2017 0935   MCH 31.9 07/01/2017 0935   MCHC 34.1 07/01/2017 0935   RDW 13.4 07/01/2017 0935   LYMPHSABS 0.8 07/01/2017 0935   MONOABS 0.2 07/01/2017 0935   EOSABS 0.0 07/01/2017 0935   BASOSABS 0.0 07/01/2017 0935   CMP Latest Ref Rng & Units 07/01/2017 06/17/2017 06/03/2017  Glucose 65 - 99 mg/dL 121(H) 128(H) 108(H)  BUN 6 - 20 mg/dL 35(H) 19 21(H)  Creatinine 0.61 - 1.24 mg/dL 0.75 0.84 0.88  Sodium 135 - 145 mmol/L 137 137 140  Potassium 3.5 - 5.1 mmol/L 4.3 4.1 4.4  Chloride 101 - 111 mmol/L 105 104 106  CO2 22 - 32 mmol/L  23 24 23   Calcium 8.9 - 10.3 mg/dL 9.2 8.8(L) 9.0  Total Protein 6.5 - 8.1 g/dL 6.6 6.4(L) 6.2(L)  Total Bilirubin 0.3 - 1.2 mg/dL 0.7 0.5 0.7  Alkaline Phos 38 - 126 U/L 23(L) 33(L) 29(L)  AST 15 - 41 U/L 20 23 18   ALT 17 - 63 U/L 18 14(L) 15(L)       DIAGNOSTIC IMAGING:  I have reviewed his PET CT scan from January 2019.     ASSESSMENT & PLAN:   Multiple myeloma without remission ([facility]) 1.  IgG kappa multiple myeloma: - Initially treated with RVD from 04/07/2014 through 07/28/2014, placed on maintenance Revlimid, which was discontinued 1 and half year ago, unknown reasons, patient thinks it was discontinued when he was receiving radiation. -MRI in January of the brain showed new right temporal lesion, status post radiation therapy, PET/CT scan in January 2019 showed improvement of multiple myeloma with no new lesions, started on weekly Velcade and dexamethasone on 02/18/2017, status post 2 cycles, Velcade changed to every other week on 05/06/2017 secondary to neuropathy, takes dexamethasone 20 mg on days of Velcade. - We discussed the results of SPEP.  M spike was 0.3 (06/17/2017), 0.2 (05/06/2017),K/L ratio of 2.5 down from 3.04, kappa light chains of  33. -He is tolerating Velcade every other week very well.  His neuropathy is stable.  He is not taking acyclovir.  I have instructed him to take acyclovir twice daily for shingles prophylaxis.  As there is only minimal increase in M spike, I would not make any changes at this time.  He will continue Velcade every other week.  I plan to repeat his myeloma work-up in 2 months.  He reportedly fell few weeks ago at home.  He has some back pain for which she is seeing an orthopedic doctor.  2.  Peripheral neuropathy: He has numbness and tingling in his feet and legs, extending up to his knees.  This has been stable since last visit when we cut back on Velcade.  He is taking Neurontin twice daily.  If he takes more, he gets lightheadedness and  forgetfulness.  Dr. Dennard [name] reportedly gave him some narcotic prescription a week ago.  He does not know the name of it.  3.  Bone protection: -he is receiving Zometa every 8 weeks.  Tolerating it well.      Orders placed this encounter:  Orders Placed This Encounter  Procedures  . Protein electrophoresis, serum  . Kappa/lambda light chains  . Comprehensive metabolic panel  . CBC with Differential      Derek Jack, MD [place] [phone]

## 2017-07-02 ENCOUNTER — Ambulatory Visit (HOSPITAL_COMMUNITY): Payer: Medicare Other | Admitting: Hematology

## 2017-07-15 ENCOUNTER — Inpatient Hospital Stay (HOSPITAL_COMMUNITY): Payer: Medicare Other | Attending: Hematology

## 2017-07-15 ENCOUNTER — Other Ambulatory Visit: Payer: Self-pay

## 2017-07-15 ENCOUNTER — Inpatient Hospital Stay (HOSPITAL_COMMUNITY): Payer: Medicare Other

## 2017-07-15 ENCOUNTER — Encounter (HOSPITAL_COMMUNITY): Payer: Self-pay

## 2017-07-15 VITALS — BP 152/64 | HR 86 | Temp 97.6°F | Resp 18 | Wt 250.0 lb

## 2017-07-15 DIAGNOSIS — Z5112 Encounter for antineoplastic immunotherapy: Secondary | ICD-10-CM | POA: Diagnosis not present

## 2017-07-15 DIAGNOSIS — C9000 Multiple myeloma not having achieved remission: Secondary | ICD-10-CM

## 2017-07-15 LAB — CBC WITH DIFFERENTIAL/PLATELET
Basophils Absolute: 0 10*3/uL (ref 0.0–0.1)
Basophils Relative: 0 %
Eosinophils Absolute: 0 10*3/uL (ref 0.0–0.7)
Eosinophils Relative: 0 %
HCT: 44.4 % (ref 39.0–52.0)
Hemoglobin: 14.6 g/dL (ref 13.0–17.0)
Lymphocytes Relative: 10 %
Lymphs Abs: 0.7 10*3/uL (ref 0.7–4.0)
MCH: 31.3 pg (ref 26.0–34.0)
MCHC: 32.9 g/dL (ref 30.0–36.0)
MCV: 95.1 fL (ref 78.0–100.0)
Monocytes Absolute: 0.2 10*3/uL (ref 0.1–1.0)
Monocytes Relative: 2 %
Neutro Abs: 5.9 10*3/uL (ref 1.7–7.7)
Neutrophils Relative %: 88 %
Platelets: 113 10*3/uL — ABNORMAL LOW (ref 150–400)
RBC: 4.67 MIL/uL (ref 4.22–5.81)
RDW: 13.1 % (ref 11.5–15.5)
WBC: 6.7 10*3/uL (ref 4.0–10.5)

## 2017-07-15 LAB — COMPREHENSIVE METABOLIC PANEL
ALT: 17 U/L (ref 17–63)
AST: 20 U/L (ref 15–41)
Albumin: 4 g/dL (ref 3.5–5.0)
Alkaline Phosphatase: 30 U/L — ABNORMAL LOW (ref 38–126)
Anion gap: 10 (ref 5–15)
BUN: 22 mg/dL — ABNORMAL HIGH (ref 6–20)
CO2: 23 mmol/L (ref 22–32)
Calcium: 9.3 mg/dL (ref 8.9–10.3)
Chloride: 107 mmol/L (ref 101–111)
Creatinine, Ser: 0.8 mg/dL (ref 0.61–1.24)
GFR calc Af Amer: >60 mL/min (ref >60)
GFR calc non Af Amer: >60 mL/min (ref >60)
Glucose, Bld: 112 mg/dL — ABNORMAL HIGH (ref 65–99)
Potassium: 4.4 mmol/L (ref 3.5–5.1)
Sodium: 140 mmol/L (ref 135–145)
Total Bilirubin: 0.6 mg/dL (ref 0.3–1.2)
Total Protein: 6.7 g/dL (ref 6.5–8.1)

## 2017-07-15 LAB — LACTATE DEHYDROGENASE: LDH: 142 U/L (ref 98–192)

## 2017-07-15 MED ORDER — PROCHLORPERAZINE MALEATE 10 MG PO TABS
10.0000 mg | ORAL_TABLET | Freq: Once | ORAL | Status: AC
  Administered 2017-07-15: 10 mg via ORAL

## 2017-07-15 MED ORDER — BORTEZOMIB CHEMO SQ INJECTION 3.5 MG (2.5MG/ML)
1.3000 mg/m2 | Freq: Once | INTRAMUSCULAR | Status: AC
  Administered 2017-07-15: 3 mg via SUBCUTANEOUS
  Filled 2017-07-15: qty 1.2

## 2017-07-15 MED ORDER — PROCHLORPERAZINE MALEATE 10 MG PO TABS
ORAL_TABLET | ORAL | Status: AC
  Filled 2017-07-15: qty 1

## 2017-07-15 NOTE — Progress Notes (Signed)
Dean [name]. presents today for injection per the provider's orders.  Velcade administration without incident; see MAR for injection details.  Patient tolerated procedure well and without incident.  No questions or complaints noted at this time.  Discharged ambulatory in c/o spouse.

## 2017-07-21 DIAGNOSIS — M48061 Spinal stenosis, lumbar region without neurogenic claudication: Secondary | ICD-10-CM | POA: Diagnosis not present

## 2017-07-21 DIAGNOSIS — M5136 Other intervertebral disc degeneration, lumbar region: Secondary | ICD-10-CM | POA: Diagnosis not present

## 2017-07-24 ENCOUNTER — Other Ambulatory Visit: Payer: Self-pay | Admitting: Family Medicine

## 2017-07-24 DIAGNOSIS — R600 Localized edema: Secondary | ICD-10-CM

## 2017-07-29 ENCOUNTER — Inpatient Hospital Stay (HOSPITAL_COMMUNITY): Payer: Medicare Other

## 2017-07-29 ENCOUNTER — Encounter (HOSPITAL_COMMUNITY): Payer: Self-pay

## 2017-07-29 VITALS — BP 136/62 | HR 56 | Temp 97.4°F | Resp 20 | Wt 247.0 lb

## 2017-07-29 DIAGNOSIS — C9000 Multiple myeloma not having achieved remission: Secondary | ICD-10-CM

## 2017-07-29 DIAGNOSIS — Z5112 Encounter for antineoplastic immunotherapy: Secondary | ICD-10-CM | POA: Diagnosis not present

## 2017-07-29 LAB — COMPREHENSIVE METABOLIC PANEL
ALT: 17 U/L (ref 0–44)
AST: 18 U/L (ref 15–41)
Albumin: 3.8 g/dL (ref 3.5–5.0)
Alkaline Phosphatase: 25 U/L — ABNORMAL LOW (ref 38–126)
Anion gap: 8 (ref 5–15)
BUN: 24 mg/dL — ABNORMAL HIGH (ref 8–23)
CO2: 26 mmol/L (ref 22–32)
Calcium: 8.9 mg/dL (ref 8.9–10.3)
Chloride: 105 mmol/L (ref 98–111)
Creatinine, Ser: 0.82 mg/dL (ref 0.61–1.24)
GFR calc Af Amer: >60 mL/min (ref >60)
GFR calc non Af Amer: >60 mL/min (ref >60)
Glucose, Bld: 103 mg/dL — ABNORMAL HIGH (ref 70–99)
Potassium: 4 mmol/L (ref 3.5–5.1)
Sodium: 139 mmol/L (ref 135–145)
Total Bilirubin: 0.8 mg/dL (ref 0.3–1.2)
Total Protein: 6.3 g/dL — ABNORMAL LOW (ref 6.5–8.1)

## 2017-07-29 LAB — CBC WITH DIFFERENTIAL/PLATELET
Basophils Absolute: 0 10*3/uL (ref 0.0–0.1)
Basophils Relative: 0 %
Eosinophils Absolute: 0 10*3/uL (ref 0.0–0.7)
Eosinophils Relative: 0 %
HCT: 40.7 % (ref 39.0–52.0)
Hemoglobin: 13.6 g/dL (ref 13.0–17.0)
Lymphocytes Relative: 17 %
Lymphs Abs: 1 10*3/uL (ref 0.7–4.0)
MCH: 31.7 pg (ref 26.0–34.0)
MCHC: 33.4 g/dL (ref 30.0–36.0)
MCV: 94.9 fL (ref 78.0–100.0)
Monocytes Absolute: 0.6 10*3/uL (ref 0.1–1.0)
Monocytes Relative: 11 %
Neutro Abs: 3.9 10*3/uL (ref 1.7–7.7)
Neutrophils Relative %: 72 %
Platelets: 120 10*3/uL — ABNORMAL LOW (ref 150–400)
RBC: 4.29 MIL/uL (ref 4.22–5.81)
RDW: 13.3 % (ref 11.5–15.5)
WBC: 5.5 10*3/uL (ref 4.0–10.5)

## 2017-07-29 LAB — LACTATE DEHYDROGENASE: LDH: 143 U/L (ref 98–192)

## 2017-07-29 MED ORDER — PROCHLORPERAZINE MALEATE 10 MG PO TABS
10.0000 mg | ORAL_TABLET | Freq: Once | ORAL | Status: AC
  Administered 2017-07-29: 10 mg via ORAL

## 2017-07-29 MED ORDER — PROCHLORPERAZINE MALEATE 10 MG PO TABS
ORAL_TABLET | ORAL | Status: AC
  Filled 2017-07-29: qty 1

## 2017-07-29 MED ORDER — BORTEZOMIB CHEMO SQ INJECTION 3.5 MG (2.5MG/ML)
1.3000 mg/m2 | Freq: Once | INTRAMUSCULAR | Status: AC
  Administered 2017-07-29: 3 mg via SUBCUTANEOUS
  Filled 2017-07-29: qty 1.2

## 2017-07-29 NOTE — Patient Instructions (Signed)
[facility] Discharge Instructions for Patients Receiving Chemotherapy   Beginning January 23rd 2017 lab work for the Cancer Center will be done in the  Main lab at [facility] on 1st floor. If you have a lab appointment with the Cancer Center please come in thru the  Main Entrance and check in at the main information desk   Today you received the following chemotherapy agents Velcade injection. Follow-up as scheduled. Call clinic for any questions or concerns  To help prevent nausea and vomiting after your treatment, we encourage you to take your nausea medication   If you develop nausea and vomiting, or diarrhea that is not controlled by your medication, call the clinic.  The clinic phone number is [phone]. Office hours are Monday-Friday 8:30am-5:00pm.  BELOW ARE SYMPTOMS THAT SHOULD BE REPORTED IMMEDIATELY:  *FEVER GREATER THAN 101.0 F  *CHILLS WITH OR WITHOUT FEVER  NAUSEA AND VOMITING THAT IS NOT CONTROLLED WITH YOUR NAUSEA MEDICATION  *UNUSUAL SHORTNESS OF BREATH  *UNUSUAL BRUISING OR BLEEDING  TENDERNESS IN MOUTH AND THROAT WITH OR WITHOUT PRESENCE OF ULCERS  *URINARY PROBLEMS  *BOWEL PROBLEMS  UNUSUAL RASH Items with * indicate a potential emergency and should be followed up as soon as possible. If you have an emergency after office hours please contact your primary care physician or go to the nearest emergency department.  Please call the clinic during office hours if you have any questions or concerns.   You may also contact the Patient Navigator at [phone] should you have any questions or need assistance in obtaining follow up care.      Resources For Cancer Patients and their Caregivers ? American Cancer Society: Can assist with transportation, wigs, general needs, runs Look Good Feel Better.        1-888-227-6333 ? Cancer Care: Provides financial assistance, online support groups, medication/co-pay assistance.   1-800-813-HOPE (4673) ? [facility] Assists [place] cancer patients and their families through emotional , educational and financial support.  336-427-4357 ? [place] DSS Where to apply for food stamps, Medicaid and utility assistance. 336-342-1394 ? [facility]: Transportation to medical appointments. 336-347-2287 ? Social Security Administration: May apply for disability if have a Stage IV cancer. 336-342-7796 1-800-772-1213 ? [place] Aging, Disability and Transit Services: Assists with nutrition, care and transit needs. 336-349-2343         

## 2017-07-29 NOTE — Progress Notes (Signed)
[name]. tolerated Velcade injection well without complaints or incident. Labs reviewed with Dr. Delton Coombes prior to administering this medication. VSS Pt discharged self ambulatory using cane in satisfactory condition accompanied by his wife

## 2017-07-30 ENCOUNTER — Other Ambulatory Visit: Payer: Self-pay | Admitting: Family Medicine

## 2017-07-30 DIAGNOSIS — M48061 Spinal stenosis, lumbar region without neurogenic claudication: Secondary | ICD-10-CM

## 2017-07-30 NOTE — Telephone Encounter (Signed)
Ok to refill??  Last office visit/ refill 06/30/2017. 

## 2017-08-04 DIAGNOSIS — M48061 Spinal stenosis, lumbar region without neurogenic claudication: Secondary | ICD-10-CM | POA: Diagnosis not present

## 2017-08-04 DIAGNOSIS — M5136 Other intervertebral disc degeneration, lumbar region: Secondary | ICD-10-CM | POA: Diagnosis not present

## 2017-08-12 ENCOUNTER — Encounter (HOSPITAL_COMMUNITY): Payer: Self-pay

## 2017-08-12 ENCOUNTER — Inpatient Hospital Stay (HOSPITAL_COMMUNITY): Payer: Medicare Other | Attending: Hematology

## 2017-08-12 ENCOUNTER — Inpatient Hospital Stay (HOSPITAL_COMMUNITY): Payer: Medicare Other

## 2017-08-12 ENCOUNTER — Other Ambulatory Visit: Payer: Self-pay

## 2017-08-12 VITALS — BP 146/60 | HR 83 | Temp 98.0°F | Resp 18 | Wt 248.2 lb

## 2017-08-12 DIAGNOSIS — Z5112 Encounter for antineoplastic immunotherapy: Secondary | ICD-10-CM | POA: Diagnosis not present

## 2017-08-12 DIAGNOSIS — C9000 Multiple myeloma not having achieved remission: Secondary | ICD-10-CM | POA: Diagnosis not present

## 2017-08-12 LAB — CBC WITH DIFFERENTIAL/PLATELET
Basophils Absolute: 0 10*3/uL (ref 0.0–0.1)
Basophils Relative: 0 %
Eosinophils Absolute: 0 10*3/uL (ref 0.0–0.7)
Eosinophils Relative: 0 %
HCT: 43.1 % (ref 39.0–52.0)
Hemoglobin: 14.5 g/dL (ref 13.0–17.0)
Lymphocytes Relative: 9 %
Lymphs Abs: 0.6 10*3/uL — ABNORMAL LOW (ref 0.7–4.0)
MCH: 31.5 pg (ref 26.0–34.0)
MCHC: 33.6 g/dL (ref 30.0–36.0)
MCV: 93.5 fL (ref 78.0–100.0)
Monocytes Absolute: 0.2 10*3/uL (ref 0.1–1.0)
Monocytes Relative: 3 %
Neutro Abs: 5.9 10*3/uL (ref 1.7–7.7)
Neutrophils Relative %: 88 %
Platelets: 104 10*3/uL — ABNORMAL LOW (ref 150–400)
RBC: 4.61 MIL/uL (ref 4.22–5.81)
RDW: 13.3 % (ref 11.5–15.5)
WBC: 6.7 10*3/uL (ref 4.0–10.5)

## 2017-08-12 LAB — COMPREHENSIVE METABOLIC PANEL
ALT: 14 U/L (ref 0–44)
AST: 22 U/L (ref 15–41)
Albumin: 3.8 g/dL (ref 3.5–5.0)
Alkaline Phosphatase: 26 U/L — ABNORMAL LOW (ref 38–126)
Anion gap: 8 (ref 5–15)
BUN: 21 mg/dL (ref 8–23)
CO2: 26 mmol/L (ref 22–32)
Calcium: 9 mg/dL (ref 8.9–10.3)
Chloride: 106 mmol/L (ref 98–111)
Creatinine, Ser: 0.84 mg/dL (ref 0.61–1.24)
GFR calc Af Amer: >60 mL/min (ref >60)
GFR calc non Af Amer: >60 mL/min (ref >60)
Glucose, Bld: 112 mg/dL — ABNORMAL HIGH (ref 70–99)
Potassium: 4.2 mmol/L (ref 3.5–5.1)
Sodium: 140 mmol/L (ref 135–145)
Total Bilirubin: 0.7 mg/dL (ref 0.3–1.2)
Total Protein: 6.6 g/dL (ref 6.5–8.1)

## 2017-08-12 MED ORDER — SODIUM CHLORIDE 0.9 % IV SOLN
INTRAVENOUS | Status: DC
Start: 2017-08-12 — End: 2017-08-12
  Administered 2017-08-12: 14:00:00 via INTRAVENOUS

## 2017-08-12 MED ORDER — ZOLEDRONIC ACID 4 MG/100ML IV SOLN
4.0000 mg | Freq: Once | INTRAVENOUS | Status: AC
  Administered 2017-08-12: 4 mg via INTRAVENOUS
  Filled 2017-08-12: qty 100

## 2017-08-12 MED ORDER — BORTEZOMIB CHEMO SQ INJECTION 3.5 MG (2.5MG/ML)
1.3000 mg/m2 | Freq: Once | INTRAMUSCULAR | Status: AC
  Administered 2017-08-12: 3 mg via SUBCUTANEOUS
  Filled 2017-08-12: qty 1.2

## 2017-08-12 MED ORDER — PROCHLORPERAZINE MALEATE 10 MG PO TABS
10.0000 mg | ORAL_TABLET | Freq: Once | ORAL | Status: AC
  Administered 2017-08-12: 10 mg via ORAL
  Filled 2017-08-12: qty 1

## 2017-08-12 NOTE — Progress Notes (Signed)
Tolerated Zometa infusion w/o adverse reaction.  Dean [name]. presents today for injection per the provider's orders.  Velcade administration without incident; see MAR for injection details.  Patient tolerated procedure well and without incident.  No questions or complaints noted at this time.  Discharged ambulatory in c/o spouse.

## 2017-08-13 LAB — PROTEIN ELECTROPHORESIS, SERUM
A/G Ratio: 1.4 (ref 0.7–1.7)
Albumin ELP: 3.4 g/dL (ref 2.9–4.4)
Alpha-1-Globulin: 0.2 g/dL (ref 0.0–0.4)
Alpha-2-Globulin: 0.8 g/dL (ref 0.4–1.0)
Beta Globulin: 0.9 g/dL (ref 0.7–1.3)
Gamma Globulin: 0.5 g/dL (ref 0.4–1.8)
Globulin, Total: 2.5 g/dL (ref 2.2–3.9)
M-Spike, %: 0.3 g/dL — ABNORMAL HIGH
Total Protein ELP: 5.9 g/dL — ABNORMAL LOW (ref 6.0–8.5)

## 2017-08-13 LAB — KAPPA/LAMBDA LIGHT CHAINS
Kappa free light chain: 32 mg/L — ABNORMAL HIGH (ref 3.3–19.4)
Kappa, lambda light chain ratio: 4 — ABNORMAL HIGH (ref 0.26–1.65)
Lambda free light chains: 8 mg/L (ref 5.7–26.3)

## 2017-08-19 DIAGNOSIS — M48061 Spinal stenosis, lumbar region without neurogenic claudication: Secondary | ICD-10-CM | POA: Diagnosis not present

## 2017-08-21 ENCOUNTER — Telehealth: Payer: Self-pay | Admitting: Family Medicine

## 2017-08-21 NOTE — Telephone Encounter (Signed)
Patient calling to get clarification about pain medication prescribed by another physician  [phone]

## 2017-08-21 NOTE — Telephone Encounter (Signed)
I cannot clarify a medication ordered by a different physician.   Please have patient contact ordering prescriber.

## 2017-08-24 ENCOUNTER — Other Ambulatory Visit (HOSPITAL_COMMUNITY): Payer: Self-pay

## 2017-08-24 DIAGNOSIS — Z5111 Encounter for antineoplastic chemotherapy: Secondary | ICD-10-CM

## 2017-08-24 DIAGNOSIS — C9000 Multiple myeloma not having achieved remission: Secondary | ICD-10-CM

## 2017-08-26 ENCOUNTER — Inpatient Hospital Stay (HOSPITAL_COMMUNITY): Payer: Medicare Other

## 2017-08-26 ENCOUNTER — Encounter (HOSPITAL_COMMUNITY): Payer: Self-pay | Admitting: Hematology

## 2017-08-26 ENCOUNTER — Inpatient Hospital Stay (HOSPITAL_BASED_OUTPATIENT_CLINIC_OR_DEPARTMENT_OTHER): Payer: Medicare Other | Admitting: Hematology

## 2017-08-26 ENCOUNTER — Other Ambulatory Visit: Payer: Self-pay

## 2017-08-26 VITALS — BP 128/94 | HR 87 | Temp 97.6°F | Resp 16 | Wt 243.4 lb

## 2017-08-26 DIAGNOSIS — Z5112 Encounter for antineoplastic immunotherapy: Secondary | ICD-10-CM | POA: Diagnosis not present

## 2017-08-26 DIAGNOSIS — G620 Drug-induced polyneuropathy: Secondary | ICD-10-CM | POA: Diagnosis not present

## 2017-08-26 DIAGNOSIS — T451X5A Adverse effect of antineoplastic and immunosuppressive drugs, initial encounter: Secondary | ICD-10-CM | POA: Diagnosis not present

## 2017-08-26 DIAGNOSIS — C9000 Multiple myeloma not having achieved remission: Secondary | ICD-10-CM | POA: Diagnosis not present

## 2017-08-26 DIAGNOSIS — Z5111 Encounter for antineoplastic chemotherapy: Secondary | ICD-10-CM

## 2017-08-26 LAB — CBC WITH DIFFERENTIAL/PLATELET
Basophils Absolute: 0 10*3/uL (ref 0.0–0.1)
Basophils Relative: 0 %
Eosinophils Absolute: 0 10*3/uL (ref 0.0–0.7)
Eosinophils Relative: 0 %
HCT: 43.4 % (ref 39.0–52.0)
Hemoglobin: 14.9 g/dL (ref 13.0–17.0)
Lymphocytes Relative: 11 %
Lymphs Abs: 0.8 10*3/uL (ref 0.7–4.0)
MCH: 32 pg (ref 26.0–34.0)
MCHC: 34.3 g/dL (ref 30.0–36.0)
MCV: 93.1 fL (ref 78.0–100.0)
Monocytes Absolute: 0.3 10*3/uL (ref 0.1–1.0)
Monocytes Relative: 4 %
Neutro Abs: 6 10*3/uL (ref 1.7–7.7)
Neutrophils Relative %: 85 %
Platelets: 113 10*3/uL — ABNORMAL LOW (ref 150–400)
RBC: 4.66 MIL/uL (ref 4.22–5.81)
RDW: 13.1 % (ref 11.5–15.5)
WBC: 7 10*3/uL (ref 4.0–10.5)

## 2017-08-26 LAB — COMPREHENSIVE METABOLIC PANEL
ALT: 15 U/L (ref 0–44)
AST: 17 U/L (ref 15–41)
Albumin: 3.9 g/dL (ref 3.5–5.0)
Alkaline Phosphatase: 25 U/L — ABNORMAL LOW (ref 38–126)
Anion gap: 8 (ref 5–15)
BUN: 22 mg/dL (ref 8–23)
CO2: 26 mmol/L (ref 22–32)
Calcium: 9.2 mg/dL (ref 8.9–10.3)
Chloride: 105 mmol/L (ref 98–111)
Creatinine, Ser: 0.79 mg/dL (ref 0.61–1.24)
GFR calc Af Amer: >60 mL/min (ref >60)
GFR calc non Af Amer: >60 mL/min (ref >60)
Glucose, Bld: 111 mg/dL — ABNORMAL HIGH (ref 70–99)
Potassium: 4.4 mmol/L (ref 3.5–5.1)
Sodium: 139 mmol/L (ref 135–145)
Total Bilirubin: 0.6 mg/dL (ref 0.3–1.2)
Total Protein: 6.8 g/dL (ref 6.5–8.1)

## 2017-08-26 MED ORDER — PROCHLORPERAZINE MALEATE 10 MG PO TABS
10.0000 mg | ORAL_TABLET | Freq: Once | ORAL | Status: AC
  Administered 2017-08-26: 10 mg via ORAL
  Filled 2017-08-26: qty 1

## 2017-08-26 MED ORDER — BORTEZOMIB CHEMO SQ INJECTION 3.5 MG (2.5MG/ML)
1.3000 mg/m2 | Freq: Once | INTRAMUSCULAR | Status: AC
  Administered 2017-08-26: 3 mg via SUBCUTANEOUS
  Filled 2017-08-26: qty 1.2

## 2017-08-26 NOTE — Progress Notes (Signed)
[name]. tolerated Velcade injection without complaints or incident. Labs reviewed with and pt seen by Dr. Delton Coombes prior to administering this medication. Pt discharged self ambulatory using his cane in satisfactory condition

## 2017-08-26 NOTE — Patient Instructions (Signed)
[facility] Discharge Instructions for Patients Receiving Chemotherapy   Beginning January 23rd 2017 lab work for the Cancer Center will be done in the  Main lab at [facility] on 1st floor. If you have a lab appointment with the Cancer Center please come in thru the  Main Entrance and check in at the main information desk   Today you received the following chemotherapy agents Velcade injection. Follow-up as scheduled. Call clinic for any questions or concerns  To help prevent nausea and vomiting after your treatment, we encourage you to take your nausea medication   If you develop nausea and vomiting, or diarrhea that is not controlled by your medication, call the clinic.  The clinic phone number is [phone]. Office hours are Monday-Friday 8:30am-5:00pm.  BELOW ARE SYMPTOMS THAT SHOULD BE REPORTED IMMEDIATELY:  *FEVER GREATER THAN 101.0 F  *CHILLS WITH OR WITHOUT FEVER  NAUSEA AND VOMITING THAT IS NOT CONTROLLED WITH YOUR NAUSEA MEDICATION  *UNUSUAL SHORTNESS OF BREATH  *UNUSUAL BRUISING OR BLEEDING  TENDERNESS IN MOUTH AND THROAT WITH OR WITHOUT PRESENCE OF ULCERS  *URINARY PROBLEMS  *BOWEL PROBLEMS  UNUSUAL RASH Items with * indicate a potential emergency and should be followed up as soon as possible. If you have an emergency after office hours please contact your primary care physician or go to the nearest emergency department.  Please call the clinic during office hours if you have any questions or concerns.   You may also contact the Patient Navigator at [phone] should you have any questions or need assistance in obtaining follow up care.      Resources For Cancer Patients and their Caregivers ? American Cancer Society: Can assist with transportation, wigs, general needs, runs Look Good Feel Better.        1-888-227-6333 ? Cancer Care: Provides financial assistance, online support groups, medication/co-pay assistance.   1-800-813-HOPE (4673) ? [facility] Assists [place] cancer patients and their families through emotional , educational and financial support.  336-427-4357 ? [place] DSS Where to apply for food stamps, Medicaid and utility assistance. 336-342-1394 ? [facility]: Transportation to medical appointments. 336-347-2287 ? Social Security Administration: May apply for disability if have a Stage IV cancer. 336-342-7796 1-800-772-1213 ? [place] Aging, Disability and Transit Services: Assists with nutrition, care and transit needs. 336-349-2343         

## 2017-08-26 NOTE — Progress Notes (Signed)
[name], [place] [address]   CLINIC:  Medical Oncology/Hematology  PCP:  [name], MD [address] [place] [address] [phone]   REASON FOR VISIT:  Follow-up for multiple myeloma  CURRENT THERAPY: velcade every other week with dexamethasone   BRIEF ONCOLOGIC HISTORY:    Multiple myeloma without remission ([facility])   11/16/2012 Initial Diagnosis    L occipital condyle destructive lesion, not biopsied secondary to location. XRT      12/01/2012 Imaging    L3 superior endplate compression FX, no lytic lesions to suggest myeloma      02/06/2014 Imaging    soft tissue mass along the right posterior fifth rib with some rib destruction      02/16/2014 Miscellaneous    Normal CBC, Normal CMP, kappa,lamda ratio of 2.62 (abnormal), UIEP with slightly restricted band, IgG kappa, SPEP/IEP with monoclonal protein at 0.79 g/dl, normal igG, suppressed IGM      02/20/2014 Bone Marrow Biopsy    33% kappa restricted plasma cells Normal FISH, normal cytogenetics      02/21/2014 - 03/08/2014 Radiation Therapy    30Gy to R rib lesion, lesion not biopsied      03/16/2014 PET scan    Scattered hypermetabolic osseous lesions, including the calvarium, ribs, left scapula, sacrum, and left femoral shaft. An expansile left vertebral body lesion at L3 extends into the epidural space of the left lateral recess,       03/17/2014 - 04/07/2014 Chemotherapy    Velcade and Dexamethasone due to initial denial of Revlimid by insurance company.  Zometa monthly.      03/22/2014 Imaging    MR_ L-Spine- Enhancing lesions compatible with multiple myeloma at L2, L3, and S1-2.      04/07/2014 - 07/28/2014 Chemotherapy    RVD with Revlimid at 25 mg days 1-14.  Revlimid dose reduced to 25 mg every other day x 14 days with 7 day respite beginning on day 1 of cycle 2.      04/17/2014 Adverse Reaction    Revlimid-induced rash.  Treated with steroids.      07/28/2014 - 08/04/2014 Chemotherapy    Revlimid daily      08/04/2014 Adverse Reaction    Velcade-induced peripheral neuropathy      08/04/2014 Treatment Plan Change    D/C Velcade      08/11/2014 PET scan    Response to therapy. Improvement and resolution of foci of osseous hypermetabolism.      08/22/2014 - 08/28/2014 Chemotherapy    Revlimid 25 mg days 1-21 every 28 days      08/28/2014 Adverse Reaction    Revlimid-induced rash. Medication held.  Medrol dose Pak prescribed.      09/04/2014 - 01/08/2015 Chemotherapy    Revlimid 10 mg every other day, without dexamethasone      01/08/2015 - 01/15/2015 Hospital Admission    Colonic diverticular abscess with IR drain placement      03/02/2015 PET scan    Only bony uptake is low activity at site of deformity/callus at R second rib FX, high activity in sigmoid colon, advanced sigmoid divertidulosis. Abscess not resolved?      06/04/2015 Imaging    CT nonobstructive L renal calculus, diverticulosis of descending and sigmoid colon without inflammation, moderate prostatic enlargement      07/12/2015 Surgery    Diverticulitis s/p robotic sigmoid colectomy with Dr. Johney Maine      08/23/2015 PET scan  Primarily similar hypermetabolic osseous foci within the R sided ribs. new L third rib focus of hypermetab and sclerosis is favored to be related to healing FX. no soft tissue myeloma id'd. Presumed postop hypermetab and edema about the R pelvic wall       03/06/2016 PET scan    1. Reduced activity in the prior lesion such as the right second and fifth rib lesions, activity nearly completely resolved and significantly less than added mediastinal blood pool activity. No new lesions are identified. 2. Other imaging findings of potential clinical significance: Coronary, aortic arch, and branch vessel atherosclerotic vascular disease. Aortoiliac atherosclerotic vascular disease. Enlarged prostate gland. Colonic diverticula.      02/17/2017 PET scan      HEAD/NECK: No hypermetabolic activity in the scalp. No hypermetabolic cervical lymph nodes. CHEST: No hypermetabolic mediastinal or hilar nodes. Right upper lobe scarring/atelectasis. No suspicious pulmonary nodules on the CT scan. ABDOMEN/PELVIS: No abnormal hypermetabolic activity within the liver, pancreas, adrenal glands, or spleen. No hypermetabolic lymph nodes in the abdomen or pelvis. Atherosclerotic calcifications of the abdominal aorta and branch vessels. Left renal sinus cysts. Sigmoid diverticulosis, without evidence of diverticulitis. Prostatomegaly. SKELETON: Vague hypermetabolism involving the inferior sternum, max SUV 3.2, previously 8.2. Focal hypermetabolism involving the left sacrum, max SUV 4.0, previously 6.7. EXTREMITIES: No abnormal hypermetabolic activity in the lower extremities.      02/18/2017 -  Chemotherapy    Bortezomib 1.53m/m2 QWk + Dexamethasone 150mQTue/Fri --Cycle #1, 02/18/17       02/18/2017 -  Chemotherapy    The patient had bortezomib SQ (VELCADE) chemo injection 3 mg, 1.3 mg/m2 = 3 mg, Subcutaneous,  Once, 7 of 9 cycles Administration: 3 mg (02/18/2017), 3 mg (02/26/2017), 3 mg (03/04/2017), 3 mg (03/11/2017), 3 mg (04/08/2017), 3 mg (03/18/2017), 3 mg (03/25/2017), 3 mg (04/01/2017), 3 mg (05/06/2017), 3 mg (05/20/2017), 3 mg (06/03/2017), 3 mg (06/17/2017), 3 mg (07/01/2017), 3 mg (07/15/2017), 3 mg (07/29/2017), 3 mg (08/12/2017)  for chemotherapy treatment.         INTERVAL HISTORY:  [name] male returns for routine follow-up for multiple myeloma and consideration for next cycle of chemotherapy. Patient is here today with his wife and daughter. Patient is having pain in his legs. The pain is worse in his left hip and shoots down his leg. He was seen at Ortho and they didn't call in the medication so he is still having pain. His neuropathy in his feet and legs are bad somedays. They are numb and tingling. He is still about to perform all his own ADLs and  household activities. Denies any nausea, vomiting, or diarrhea. His energy levels are down to 25% and his appetite is at 50%. He does drink carnation daily.    REVIEW OF SYSTEMS:  Review of Systems  Constitutional: Positive for chills and fatigue.  HENT:  Negative.   Eyes: Negative.   Respiratory: Negative.   Cardiovascular: Negative.   Endocrine: Negative.   Genitourinary: Negative.    Musculoskeletal: Positive for back pain (pain  back legs ankle and hip on the left side).  Neurological: Positive for extremity weakness and numbness.  Hematological: Bruises/bleeds easily.  Psychiatric/Behavioral: Negative.      PAST MEDICAL/SURGICAL HISTORY:  Past Medical History:  Diagnosis Date  . BPH (benign prostatic hyperplasia)   . Colonic diverticular abscess 01/08/2015  . Diverticulitis 1181/82/99 complicated by abscess and required percutaneous drainage  . GERD (gastroesophageal reflux disease)   . H/O ETOH abuse   .  History of chemotherapy last done jan 2017  . History of radiation therapy 01/05/13-02/10/13   45 gray to left occipital condyle region  . History of radiation therapy 12/01/16-12/10/16   Parasternal nodule, chest- 24 Gy total delivered in 8 fractions, Left sacro-iliac, pelvis- 24 Gy total delivered in 8 fractions   . History of radiation therapy 02/19/17-02/22/17   right temporal scalp 30 Gy in 10 fractions  . Intra-abdominal abscess ([facility])   . Multiple myeloma ([facility]) 2015  . Neuropathy   . Radiation 02/21/14-03/08/14   right posterior chest wall area 30 gray  . Radiation 04/19/14-05/02/14   lumbar spine 25 gray  . Skull lesion    Left occipital condyle  . Wrist fracture, left    x 2   Past Surgical History:  Procedure Laterality Date  . COLONOSCOPY N/A 04/19/2015   Procedure: COLONOSCOPY;  Surgeon: Danie Binder, MD;  Location: [facility];  Service: Endoscopy;  Laterality: N/A;  1:30 PM  . CYST EXCISION  1959   tail bone     SOCIAL HISTORY:  Social History    Socioeconomic History  . Marital status: Married    Spouse name: Not on file  . Number of children: 3  . Years of education: Not on file  . Highest education level: Not on file  Occupational History    Employer: RETIRED  Social Needs  . Financial resource strain: Not on file  . Food insecurity:    Worry: Not on file    Inability: Not on file  . Transportation needs:    Medical: Not on file    Non-medical: Not on file  Tobacco Use  . Smoking status: Never Smoker  . Smokeless tobacco: Never Used  Substance and Sexual Activity  . Alcohol use: No    Comment: " Not much no more"  . Drug use: No  . Sexual activity: Not on file  Lifestyle  . Physical activity:    Days per week: Not on file    Minutes per session: Not on file  . Stress: Not on file  Relationships  . Social connections:    Talks on phone: Not on file    Gets together: Not on file    Attends religious service: Not on file    Active member of club or organization: Not on file    Attends meetings of clubs or organizations: Not on file    Relationship status: Not on file  . Intimate partner violence:    Fear of current or ex partner: Not on file    Emotionally abused: Not on file    Physically abused: Not on file    Forced sexual activity: Not on file  Other Topics Concern  . Not on file  Social History Narrative  . Not on file    FAMILY HISTORY:  Family History  Problem Relation Age of Onset  . Diabetes Mother   . Stroke Mother   . Hypertension Father     CURRENT MEDICATIONS:  Outpatient Encounter Medications as of 08/26/2017  Medication Sig  . acyclovir (ZOVIRAX) 400 MG tablet Take 1 tablet (400 mg total) by mouth 2 (two) times daily.  Marland Kitchen ALPRAZolam (XANAX) 1 MG tablet TAKE 2 TABLETS BY MOUTH AT BEDTIME AS NEEDED FOR ANXIETY AND OR SLEEP  . aspirin EC 325 MG tablet Take 1 tablet (325 mg total) by mouth daily.  . bortezomib CALGB 16109 (VELCADE) 3.5 MG SOLR Inject 1.6 mg/m2 into the vein every 21 (  twenty-one) days.  Marland Kitchen CALCIUM-VITAMIN D PO Take by mouth. 1500/1000 PO QD  . furosemide (LASIX) 20 MG tablet Take 1 tablet (20 mg total) by mouth daily as needed for edema.  . gabapentin (NEURONTIN) 400 MG capsule TAKE 5 CAPSULES BY MOUTH DAILY  . HYDROcodone-acetaminophen (NORCO/VICODIN) 5-325 MG tablet TAKE 1 TABLET BY MOUTH EVERY 6 HOURS AS NEEDED FOR MODERATE PAIN  . ibuprofen (ADVIL,MOTRIN) 800 MG tablet TAKE 1 TABLET BY MOUTH EVERY 8 HOURS AS NEEDED FOR MODERATE PAIN.  Marland Kitchen lidocaine-prilocaine (EMLA) cream Apply to affected area once  . omeprazole (PRILOSEC) 20 MG capsule Take 1 capsule (20 mg total) by mouth daily.  . tamsulosin (FLOMAX) 0.4 MG CAPS capsule TAKE ONE CAPSULE BY MOUTH AT BEDTIME  . zolendronic acid (ZOMETA) 4 MG/5ML injection Inject 4 mg into the vein. QOMonth  . [DISCONTINUED] sodium chloride 0.9 % injection 10 mL    No facility-administered encounter medications on file as of 08/26/2017.     ALLERGIES:  Allergies  Allergen Reactions  . Codeine Other (See Comments)    Severe headache  . Morphine And Related Other (See Comments)    Makes him feel weird  . Revlimid [Lenalidomide] Other (See Comments)    "Causes me to become weak"     PHYSICAL EXAM:  ECOG Performance status: 1  Vitals:   08/26/17 1103  BP: (!) 128/94  Pulse: 87  Resp: 16  Temp: 97.6 F (36.4 C)  SpO2: 96%   Filed Weights   08/26/17 1103  Weight: 243 lb 6.4 oz (110.4 kg)    Physical Exam Muscle strength in the lower extremities is 4/5.  LABORATORY DATA:  I have reviewed the labs as listed.  CBC    Component Value Date/Time   WBC 7.0 08/26/2017 1002   RBC 4.66 08/26/2017 1002   HGB 14.9 08/26/2017 1002   HCT 43.4 08/26/2017 1002   PLT 113 (L) 08/26/2017 1002   MCV 93.1 08/26/2017 1002   MCH 32.0 08/26/2017 1002   MCHC 34.3 08/26/2017 1002   RDW 13.1 08/26/2017 1002   LYMPHSABS 0.8 08/26/2017 1002   MONOABS 0.3 08/26/2017 1002   EOSABS 0.0 08/26/2017 1002   BASOSABS 0.0  08/26/2017 1002   CMP Latest Ref Rng & Units 08/26/2017 08/12/2017 07/29/2017  Glucose 70 - 99 mg/dL 111(H) 112(H) 103(H)  BUN 8 - 23 mg/dL 22 21 24(H)  Creatinine 0.61 - 1.24 mg/dL 0.79 0.84 0.82  Sodium 135 - 145 mmol/L 139 140 139  Potassium 3.5 - 5.1 mmol/L 4.4 4.2 4.0  Chloride 98 - 111 mmol/L 105 106 105  CO2 22 - 32 mmol/L 26 26 26   Calcium 8.9 - 10.3 mg/dL 9.2 9.0 8.9  Total Protein 6.5 - 8.1 g/dL 6.8 6.6 6.3(L)  Total Bilirubin 0.3 - 1.2 mg/dL 0.6 0.7 0.8  Alkaline Phos 38 - 126 U/L 25(L) 26(L) 25(L)  AST 15 - 41 U/L 17 22 18   ALT 0 - 44 U/L 15 14 17        DIAGNOSTIC IMAGING:  I have reviewed his MRI of the lumbar spine reports from 2018.     ASSESSMENT & PLAN:   Multiple myeloma without remission ([facility]) 1.  IgG kappa multiple myeloma: - Initially treated with RVD from 04/07/2014 through 07/28/2014, placed on maintenance Revlimid, which was discontinued 1 and half year ago, unknown reasons, patient thinks it was discontinued when he was receiving radiation. -MRI in January of the brain showed new right temporal lesion, status post radiation therapy, PET/CT  scan in January 2019 showed improvement of multiple myeloma with no new lesions, started on weekly Velcade and dexamethasone on 02/18/2017, status post 2 cycles, Velcade changed to every other week on 05/06/2017 secondary to neuropathy, takes dexamethasone 20 mg on days of Velcade. - We reviewed the results of the M spike which was stable at 0.3.  Free light chain ratio went up to 4.  However kappa light chains remain the same.  He will proceed with Velcade today.  His back pain has become severe.  He saw Dr. Nelva Bush in [place] and received a shot in his back.  Pain did not improve.  He is going back to see him in August.  I have reviewed his previous MRIs.  They were done in 2018.  I will repeat MRI of the thoracic and lumbar spine as he has involvement by myeloma.  I will see him back after the MRI.  He is taking oxycodone 5 mg  as needed given by Dr. Dennard Schaumann.  He has contacted Dr. Samella Parr office for titration of pain medication and is awaiting callback.  2.  Peripheral neuropathy: He has numbness and tingling in his feet and legs, extending up to his knees.  This has been stable since last visit when we cut back on Velcade.  He is taking Neurontin twice daily.  If he takes more, he gets lightheadedness and forgetfulness.3.  Bone protection: -he is receiving Zometa every 8 weeks.  Tolerating it well.      Orders placed this encounter:  Orders Placed This Encounter  Procedures  . MR Lumbar Spine W Contrast  . MR Thoracic Spine [place], MD [place] [phone]

## 2017-08-26 NOTE — Assessment & Plan Note (Signed)
1.  IgG kappa multiple myeloma: - Initially treated with RVD from 04/07/2014 through 07/28/2014, placed on maintenance Revlimid, which was discontinued 1 and half year ago, unknown reasons, patient thinks it was discontinued when he was receiving radiation. -MRI in January of the brain showed new right temporal lesion, status post radiation therapy, PET/CT scan in January 2019 showed improvement of multiple myeloma with no new lesions, started on weekly Velcade and dexamethasone on 02/18/2017, status post 2 cycles, Velcade changed to every other week on 05/06/2017 secondary to neuropathy, takes dexamethasone 20 mg on days of Velcade. - We reviewed the results of the M spike which was stable at 0.3.  Free light chain ratio went up to 4.  However kappa light chains remain the same.  He will proceed with Velcade today.  His back pain has become severe.  He saw Dr. Nelva Bush in [place] and received a shot in his back.  Pain did not improve.  He is going back to see him in August.  I have reviewed his previous MRIs.  They were done in 2018.  I will repeat MRI of the thoracic and lumbar spine as he has involvement by myeloma.  I will see him back after the MRI.  He is taking oxycodone 5 mg as needed given by Dr. Dennard Schaumann.  He has contacted Dr. Samella Parr office for titration of pain medication and is awaiting callback.  2.  Peripheral neuropathy: He has numbness and tingling in his feet and legs, extending up to his knees.  This has been stable since last visit when we cut back on Velcade.  He is taking Neurontin twice daily.  If he takes more, he gets lightheadedness and forgetfulness.3.  Bone protection: -he is receiving Zometa every 8 weeks.  Tolerating it well.

## 2017-09-01 ENCOUNTER — Telehealth (HOSPITAL_COMMUNITY): Payer: Self-pay | Admitting: *Deleted

## 2017-09-01 NOTE — Telephone Encounter (Signed)
Pt called stating that he needs a CT scan, he is having pain in his neck and sides. Pt states that it feels like a knot on both his sides when he lays down. Please advise.

## 2017-09-01 NOTE — Telephone Encounter (Signed)
I spoke with Dr. Raliegh Ip about switching the pt's scan to a PET scan per pt's request and Dr. Raliegh Ip said that the MRI would be more detailed. I advised the pt of this and he is going to keep his scheduled appointment on Thursday. Pt verbalized understanding.

## 2017-09-03 ENCOUNTER — Ambulatory Visit (HOSPITAL_COMMUNITY)
Admission: RE | Admit: 2017-09-03 | Discharge: 2017-09-03 | Disposition: A | Discharge status: Home / Self Care | Payer: Medicare Other | Source: Ambulatory Visit | Attending: Nurse Practitioner | Admitting: Nurse Practitioner

## 2017-09-03 DIAGNOSIS — C9000 Multiple myeloma not having achieved remission: Secondary | ICD-10-CM | POA: Diagnosis not present

## 2017-09-03 DIAGNOSIS — M5124 Other intervertebral disc displacement, thoracic region: Secondary | ICD-10-CM | POA: Diagnosis not present

## 2017-09-03 DIAGNOSIS — M8448XA Pathological fracture, other site, initial encounter for fracture: Secondary | ICD-10-CM | POA: Insufficient documentation

## 2017-09-03 DIAGNOSIS — M47816 Spondylosis without myelopathy or radiculopathy, lumbar region: Secondary | ICD-10-CM | POA: Insufficient documentation

## 2017-09-03 DIAGNOSIS — M48061 Spinal stenosis, lumbar region without neurogenic claudication: Secondary | ICD-10-CM | POA: Insufficient documentation

## 2017-09-03 MED ORDER — GADOBENATE DIMEGLUMINE 529 MG/ML IV SOLN
20.0000 mL | Freq: Once | INTRAVENOUS | Status: AC | PRN
  Administered 2017-09-03: 20 mL via INTRAVENOUS

## 2017-09-09 ENCOUNTER — Encounter (HOSPITAL_COMMUNITY): Payer: Self-pay | Admitting: Hematology

## 2017-09-09 ENCOUNTER — Inpatient Hospital Stay (HOSPITAL_BASED_OUTPATIENT_CLINIC_OR_DEPARTMENT_OTHER): Payer: Medicare Other | Admitting: Hematology

## 2017-09-09 ENCOUNTER — Inpatient Hospital Stay (HOSPITAL_COMMUNITY): Payer: Medicare Other

## 2017-09-09 ENCOUNTER — Inpatient Hospital Stay (HOSPITAL_COMMUNITY): Payer: Medicare Other | Attending: Hematology

## 2017-09-09 VITALS — BP 132/61 | HR 74 | Temp 98.5°F | Resp 18 | Wt 249.9 lb

## 2017-09-09 DIAGNOSIS — C9000 Multiple myeloma not having achieved remission: Secondary | ICD-10-CM | POA: Diagnosis not present

## 2017-09-09 DIAGNOSIS — G620 Drug-induced polyneuropathy: Secondary | ICD-10-CM | POA: Insufficient documentation

## 2017-09-09 DIAGNOSIS — M5136 Other intervertebral disc degeneration, lumbar region: Secondary | ICD-10-CM | POA: Insufficient documentation

## 2017-09-09 DIAGNOSIS — T451X5A Adverse effect of antineoplastic and immunosuppressive drugs, initial encounter: Secondary | ICD-10-CM | POA: Insufficient documentation

## 2017-09-09 DIAGNOSIS — M549 Dorsalgia, unspecified: Secondary | ICD-10-CM | POA: Diagnosis not present

## 2017-09-09 DIAGNOSIS — Z7189 Other specified counseling: Secondary | ICD-10-CM

## 2017-09-09 DIAGNOSIS — R21 Rash and other nonspecific skin eruption: Secondary | ICD-10-CM | POA: Diagnosis not present

## 2017-09-09 DIAGNOSIS — Z5112 Encounter for antineoplastic immunotherapy: Secondary | ICD-10-CM | POA: Diagnosis not present

## 2017-09-09 DIAGNOSIS — Z923 Personal history of irradiation: Secondary | ICD-10-CM | POA: Diagnosis not present

## 2017-09-09 DIAGNOSIS — R5383 Other fatigue: Secondary | ICD-10-CM | POA: Diagnosis not present

## 2017-09-09 DIAGNOSIS — Z5111 Encounter for antineoplastic chemotherapy: Secondary | ICD-10-CM

## 2017-09-09 LAB — COMPREHENSIVE METABOLIC PANEL
ALT: 15 U/L (ref 0–44)
AST: 25 U/L (ref 15–41)
Albumin: 3.5 g/dL (ref 3.5–5.0)
Alkaline Phosphatase: 25 U/L — ABNORMAL LOW (ref 38–126)
Anion gap: 9 (ref 5–15)
BUN: 18 mg/dL (ref 8–23)
CO2: 22 mmol/L (ref 22–32)
Calcium: 8.7 mg/dL — ABNORMAL LOW (ref 8.9–10.3)
Chloride: 107 mmol/L (ref 98–111)
Creatinine, Ser: 0.87 mg/dL (ref 0.61–1.24)
GFR calc Af Amer: >60 mL/min (ref >60)
GFR calc non Af Amer: >60 mL/min (ref >60)
Glucose, Bld: 139 mg/dL — ABNORMAL HIGH (ref 70–99)
Potassium: 3.9 mmol/L (ref 3.5–5.1)
Sodium: 138 mmol/L (ref 135–145)
Total Bilirubin: 0.6 mg/dL (ref 0.3–1.2)
Total Protein: 6.1 g/dL — ABNORMAL LOW (ref 6.5–8.1)

## 2017-09-09 LAB — CBC WITH DIFFERENTIAL/PLATELET
Basophils Absolute: 0 10*3/uL (ref 0.0–0.1)
Basophils Relative: 0 %
Eosinophils Absolute: 0 10*3/uL (ref 0.0–0.7)
Eosinophils Relative: 0 %
HCT: 41 % (ref 39.0–52.0)
Hemoglobin: 13.9 g/dL (ref 13.0–17.0)
Lymphocytes Relative: 13 %
Lymphs Abs: 0.7 10*3/uL (ref 0.7–4.0)
MCH: 31.9 pg (ref 26.0–34.0)
MCHC: 33.9 g/dL (ref 30.0–36.0)
MCV: 94 fL (ref 78.0–100.0)
Monocytes Absolute: 0.4 10*3/uL (ref 0.1–1.0)
Monocytes Relative: 6 %
Neutro Abs: 4.7 10*3/uL (ref 1.7–7.7)
Neutrophils Relative %: 81 %
Platelets: 115 10*3/uL — ABNORMAL LOW (ref 150–400)
RBC: 4.36 MIL/uL (ref 4.22–5.81)
RDW: 13.4 % (ref 11.5–15.5)
WBC: 5.9 10*3/uL (ref 4.0–10.5)

## 2017-09-09 MED ORDER — OXYCODONE HCL 10 MG PO TABS: 10.0000 mg | ORAL_TABLET | Freq: Three times a day (TID) | ORAL | 0 refills | Status: DC | PRN

## 2017-09-09 MED ORDER — POMALIDOMIDE 2 MG PO CAPS: 2.0000 mg | ORAL_CAPSULE | Freq: Every day | ORAL | 0 refills | Status: DC

## 2017-09-09 NOTE — Progress Notes (Signed)
Dean [name], [place] [address]   CLINIC:  Medical Oncology/Hematology  PCP:  [name], MD [address] [place] [address] [phone]   REASON FOR VISIT:  Follow-up for multiple myeloma  CURRENT THERAPY: changing treatment to pomalidomide, dexamethasone, and IV daratumumab  BRIEF ONCOLOGIC HISTORY:    Multiple myeloma without remission ([place])   11/16/2012 Initial Diagnosis    L occipital condyle destructive lesion, not biopsied secondary to location. XRT      12/01/2012 Imaging    L3 superior endplate compression FX, no lytic lesions to suggest myeloma      02/06/2014 Imaging    soft tissue mass along the right posterior fifth rib with some rib destruction      02/16/2014 Miscellaneous    Normal CBC, Normal CMP, kappa,lamda ratio of 2.62 (abnormal), UIEP with slightly restricted band, IgG kappa, SPEP/IEP with monoclonal protein at 0.79 g/dl, normal igG, suppressed IGM      02/20/2014 Bone Marrow Biopsy    33% kappa restricted plasma cells Normal FISH, normal cytogenetics      02/21/2014 - 03/08/2014 Radiation Therapy    30Gy to R rib lesion, lesion not biopsied      03/16/2014 PET scan    Scattered hypermetabolic osseous lesions, including the calvarium, ribs, left scapula, sacrum, and left femoral shaft. An expansile left vertebral body lesion at L3 extends into the epidural space of the left lateral recess,       03/17/2014 - 04/07/2014 Chemotherapy    Velcade and Dexamethasone due to initial denial of Revlimid by insurance company.  Zometa monthly.      03/22/2014 Imaging    MR_ L-Spine- Enhancing lesions compatible with multiple myeloma at L2, L3, and S1-2.      04/07/2014 - 07/28/2014 Chemotherapy    RVD with Revlimid at 25 mg days 1-14.  Revlimid dose reduced to 25 mg every other day x 14 days with 7 day respite beginning on day 1 of cycle 2.      04/17/2014 Adverse Reaction    Revlimid-induced rash.   Treated with steroids.      07/28/2014 - 08/04/2014 Chemotherapy    Revlimid daily      08/04/2014 Adverse Reaction    Velcade-induced peripheral neuropathy      08/04/2014 Treatment Plan Change    D/C Velcade      08/11/2014 PET scan    Response to therapy. Improvement and resolution of foci of osseous hypermetabolism.      08/22/2014 - 08/28/2014 Chemotherapy    Revlimid 25 mg days 1-21 every 28 days      08/28/2014 Adverse Reaction    Revlimid-induced rash. Medication held.  Medrol dose Pak prescribed.      09/04/2014 - 01/08/2015 Chemotherapy    Revlimid 10 mg every other day, without dexamethasone      01/08/2015 - 01/15/2015 Hospital Admission    Colonic diverticular abscess with IR drain placement      03/02/2015 PET scan    Only bony uptake is low activity at site of deformity/callus at R second rib FX, high activity in sigmoid colon, advanced sigmoid divertidulosis. Abscess not resolved?      06/04/2015 Imaging    CT nonobstructive L renal calculus, diverticulosis of descending and sigmoid colon without inflammation, moderate prostatic enlargement      07/12/2015 Surgery    Diverticulitis s/p robotic sigmoid colectomy with Dr. Johney Maine      08/23/2015 PET scan  Primarily similar hypermetabolic osseous foci within the R sided ribs. new L third rib focus of hypermetab and sclerosis is favored to be related to healing FX. no soft tissue myeloma id'd. Presumed postop hypermetab and edema about the R pelvic wall       03/06/2016 PET scan    1. Reduced activity in the prior lesion such as the right second and fifth rib lesions, activity nearly completely resolved and significantly less than added mediastinal blood pool activity. No new lesions are identified. 2. Other imaging findings of potential clinical significance: Coronary, aortic arch, and branch vessel atherosclerotic vascular disease. Aortoiliac atherosclerotic vascular disease. Enlarged prostate gland. Colonic  diverticula.      02/17/2017 PET scan    HEAD/NECK: No hypermetabolic activity in the scalp. No hypermetabolic cervical lymph nodes. CHEST: No hypermetabolic mediastinal or hilar nodes. Right upper lobe scarring/atelectasis. No suspicious pulmonary nodules on the CT scan. ABDOMEN/PELVIS: No abnormal hypermetabolic activity within the liver, pancreas, adrenal glands, or spleen. No hypermetabolic lymph nodes in the abdomen or pelvis. Atherosclerotic calcifications of the abdominal aorta and branch vessels. Left renal sinus cysts. Sigmoid diverticulosis, without evidence of diverticulitis. Prostatomegaly. SKELETON: Vague hypermetabolism involving the inferior sternum, max SUV 3.2, previously 8.2. Focal hypermetabolism involving the left sacrum, max SUV 4.0, previously 6.7. EXTREMITIES: No abnormal hypermetabolic activity in the lower extremities.      02/18/2017 -  Chemotherapy    Bortezomib 1.18m/m2 QWk + Dexamethasone 163mQTue/Fri --Cycle #1, 02/18/17       02/18/2017 - 08/26/2017 Chemotherapy    The patient had bortezomib SQ (VELCADE) chemo injection 3 mg, 1.3 mg/m2 = 3 mg, Subcutaneous,  Once, 7 of 9 cycles Administration: 3 mg (02/18/2017), 3 mg (02/26/2017), 3 mg (03/04/2017), 3 mg (03/11/2017), 3 mg (04/08/2017), 3 mg (03/18/2017), 3 mg (03/25/2017), 3 mg (04/01/2017), 3 mg (05/06/2017), 3 mg (05/20/2017), 3 mg (06/03/2017), 3 mg (06/17/2017), 3 mg (07/01/2017), 3 mg (07/15/2017), 3 mg (07/29/2017), 3 mg (08/12/2017), 3 mg (08/26/2017)  for chemotherapy treatment.       09/14/2017 -  Chemotherapy    The patient had daratumumab (DARZALEX) 1,820 mg in sodium chloride 0.9 % 909 mL (1.82 mg/mL) chemo infusion, 16 mg/kg, Intravenous, Once, 0 of 1 cycle daratumumab (DARZALEX) 1,820 mg in sodium chloride 0.9 % 409 mL (3.64 mg/mL) chemo infusion, 16 mg/kg, Intravenous, Once, 0 of 7 cycles  for chemotherapy treatment.          INTERVAL HISTORY:  Mr. HeHeyward21.o. male returns for routine follow-up for multiple  myeloma. Patient is here today with his wife. He has been having increasing pain in his back. He is having trouble laying on his left side due to the pain. He walks with his cane for stability. He lives at home with his wife and she helps with his ADLs. He is unable to be active at this time due to his pain. Patient denies nausea, vomiting, or diarrhea. Denies fevers or recent infections.    REVIEW OF SYSTEMS:  Review of Systems  Constitutional: Positive for chills, fatigue and fever.  HENT:  Negative.   Eyes: Negative.   Respiratory: Negative.   Cardiovascular: Positive for leg swelling.  Gastrointestinal: Negative.   Endocrine: Negative.   Musculoskeletal: Positive for back pain.  Skin: Positive for itching.  Neurological: Positive for numbness.  Hematological: Bruises/bleeds easily.     PAST MEDICAL/SURGICAL HISTORY:  Past Medical History:  Diagnosis Date  . BPH (benign prostatic hyperplasia)   . Colonic diverticular abscess 01/08/2015  .  Diverticulitis 45/62/56   complicated by abscess and required percutaneous drainage  . GERD (gastroesophageal reflux disease)   . H/O ETOH abuse   . History of chemotherapy last done jan 2017  . History of radiation therapy 01/05/13-02/10/13   45 gray to left occipital condyle region  . History of radiation therapy 12/01/16-12/10/16   Parasternal nodule, chest- 24 Gy total delivered in 8 fractions, Left sacro-iliac, pelvis- 24 Gy total delivered in 8 fractions   . History of radiation therapy 02/19/17-02/22/17   right temporal scalp 30 Gy in 10 fractions  . Intra-abdominal abscess ([place])   . Multiple myeloma ([place]) 2015  . Neuropathy   . Radiation 02/21/14-03/08/14   right posterior chest wall area 30 gray  . Radiation 04/19/14-05/02/14   lumbar spine 25 gray  . Skull lesion    Left occipital condyle  . Wrist fracture, left    x 2   Past Surgical History:  Procedure Laterality Date  . COLONOSCOPY N/A 04/19/2015   Procedure: COLONOSCOPY;   Surgeon: Danie Binder, MD;  Location: [facility];  Service: Endoscopy;  Laterality: N/A;  1:30 PM  . CYST EXCISION  1959   tail bone     SOCIAL HISTORY:  Social History   Socioeconomic History  . Marital status: Married    Spouse name: Not on file  . Number of children: 3  . Years of education: Not on file  . Highest education level: Not on file  Occupational History    Employer: RETIRED  Social Needs  . Financial resource strain: Not on file  . Food insecurity:    Worry: Not on file    Inability: Not on file  . Transportation needs:    Medical: Not on file    Non-medical: Not on file  Tobacco Use  . Smoking status: Never Smoker  . Smokeless tobacco: Never Used  Substance and Sexual Activity  . Alcohol use: No    Comment: " Not much no more"  . Drug use: No  . Sexual activity: Not on file  Lifestyle  . Physical activity:    Days per week: Not on file    Minutes per session: Not on file  . Stress: Not on file  Relationships  . Social connections:    Talks on phone: Not on file    Gets together: Not on file    Attends religious service: Not on file    Active member of club or organization: Not on file    Attends meetings of clubs or organizations: Not on file    Relationship status: Not on file  . Intimate partner violence:    Fear of current or ex partner: Not on file    Emotionally abused: Not on file    Physically abused: Not on file    Forced sexual activity: Not on file  Other Topics Concern  . Not on file  Social History Narrative  . Not on file    FAMILY HISTORY:  Family History  Problem Relation Age of Onset  . Diabetes Mother   . Stroke Mother   . Hypertension Father     CURRENT MEDICATIONS:  Outpatient Encounter Medications as of 09/09/2017  Medication Sig  . acyclovir (ZOVIRAX) 400 MG tablet Take 1 tablet (400 mg total) by mouth 2 (two) times daily.  Marland Kitchen ALPRAZolam (XANAX) 1 MG tablet TAKE 2 TABLETS BY MOUTH AT BEDTIME AS NEEDED FOR  ANXIETY AND OR SLEEP  . aspirin EC 325 MG tablet Take  1 tablet (325 mg total) by mouth daily.  . bortezomib CALGB 70017 (VELCADE) 3.5 MG SOLR Inject 1.6 mg/m2 into the vein every 21 ( twenty-one) days.  Marland Kitchen CALCIUM-VITAMIN D PO Take by mouth. 1500/1000 PO QD  . furosemide (LASIX) 20 MG tablet Take 1 tablet (20 mg total) by mouth daily as needed for edema.  . gabapentin (NEURONTIN) 400 MG capsule TAKE 5 CAPSULES BY MOUTH DAILY  . HYDROcodone-acetaminophen (NORCO/VICODIN) 5-325 MG tablet TAKE 1 TABLET BY MOUTH EVERY 6 HOURS AS NEEDED FOR MODERATE PAIN  . ibuprofen (ADVIL,MOTRIN) 800 MG tablet TAKE 1 TABLET BY MOUTH EVERY 8 HOURS AS NEEDED FOR MODERATE PAIN.  Marland Kitchen omeprazole (PRILOSEC) 20 MG capsule Take 1 capsule (20 mg total) by mouth daily.  . tamsulosin (FLOMAX) 0.4 MG CAPS capsule TAKE ONE CAPSULE BY MOUTH AT BEDTIME  . zolendronic acid (ZOMETA) 4 MG/5ML injection Inject 4 mg into the vein. QOMonth  . [DISCONTINUED] lidocaine-prilocaine (EMLA) cream Apply to affected area once  . oxyCODONE 10 MG TABS Take 1 tablet (10 mg total) by mouth every 8 (eight) hours as needed for severe pain.  . pomalidomide (POMALYST) 2 MG capsule Take 1 capsule (2 mg total) by mouth daily. Take with water on days 1-21. Repeat every 28 days.   No facility-administered encounter medications on file as of 09/09/2017.     ALLERGIES:  Allergies  Allergen Reactions  . Codeine Other (See Comments)    Severe headache  . Morphine And Related Other (See Comments)    Makes him feel weird  . Revlimid [Lenalidomide] Other (See Comments)    "Causes me to become weak"     PHYSICAL EXAM:  ECOG Performance status: 1  Vitals:   09/09/17 0922  BP: 132/61  Pulse: 74  Resp: 18  Temp: 98.5 F (36.9 C)  SpO2: 97%   Filed Weights   09/09/17 0922  Weight: 249 lb 14.4 oz (113.4 kg)    Physical Exam  Constitutional: He is oriented to person, place, and time. He appears well-developed and well-nourished.  Cardiovascular:  Normal rate, regular rhythm and normal heart sounds.  Pulmonary/Chest: Effort normal and breath sounds normal.  Musculoskeletal: He exhibits edema (lower extrimity).  Neurological: He is alert and oriented to person, place, and time.  Skin: Skin is warm and dry.     LABORATORY DATA:  I have reviewed the labs as listed.  CBC    Component Value Date/Time   WBC 5.9 09/09/2017 0826   RBC 4.36 09/09/2017 0826   HGB 13.9 09/09/2017 0826   HCT 41.0 09/09/2017 0826   PLT 115 (L) 09/09/2017 0826   MCV 94.0 09/09/2017 0826   MCH 31.9 09/09/2017 0826   MCHC 33.9 09/09/2017 0826   RDW 13.4 09/09/2017 0826   LYMPHSABS 0.7 09/09/2017 0826   MONOABS 0.4 09/09/2017 0826   EOSABS 0.0 09/09/2017 0826   BASOSABS 0.0 09/09/2017 0826   CMP Latest Ref Rng & Units 09/09/2017 08/26/2017 08/12/2017  Glucose 70 - 99 mg/dL 139(H) 111(H) 112(H)  BUN 8 - 23 mg/dL 18 22 21   Creatinine 0.61 - 1.24 mg/dL 0.87 0.79 0.84  Sodium 135 - 145 mmol/L 138 139 140  Potassium 3.5 - 5.1 mmol/L 3.9 4.4 4.2  Chloride 98 - 111 mmol/L 107 105 106  CO2 22 - 32 mmol/L 22 26 26   Calcium 8.9 - 10.3 mg/dL 8.7(L) 9.2 9.0  Total Protein 6.5 - 8.1 g/dL 6.1(L) 6.8 6.6  Total Bilirubin 0.3 - 1.2 mg/dL 0.6 0.6 0.7  Alkaline Phos 38 - 126 U/L 25(L) 25(L) 26(L)  AST 15 - 41 U/L 25 17 22   ALT 0 - 44 U/L 15 15 14        DIAGNOSTIC IMAGING:  I have reviewed MRI of the thoracic and lumbar spine with the patient and his wife.     ASSESSMENT & PLAN:   Goals of care, counseling/discussion Treatment of multiple myeloma in the palliative setting was discussed with the patient.  Multiple myeloma without remission ([facility]) 1.  IgG kappa multiple myeloma: - Initially treated with RVD from 04/07/2014 through 07/28/2014, placed on maintenance Revlimid, which was discontinued 1 and half year ago, unknown reasons, patient thinks it was discontinued when he was receiving radiation. -MRI in January of the brain showed new right temporal lesion,  status post radiation therapy, PET/CT scan in January 2019 showed improvement of multiple myeloma with no new lesions, started on weekly Velcade and dexamethasone on 02/18/2017, status post 2 cycles, Velcade changed to every other week on 05/06/2017 secondary to neuropathy, takes dexamethasone 20 mg on days of Velcade. - We reviewed the results of the M spike which was stable at 0.3.  Free light chain ratio went up to 4. -Because of persistent back pain, I have done an MRI of the thoracic and lumbar spine.  We discussed the results of the scans today.  He had a new rib lesion close to the spine on the left side, possibly eighth rib.  There is a soft tissue mass in that area.  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  There is also chronic L4 compression fracture which is stable. - Because of the new developments, I will discontinue Velcade.  I have recommended a much stronger regimen containing daratumumab, pomalidomide and dexamethasone.  Daratumumab will be given weekly for the first 8 weeks.  Pomalidomide will be started at a reduced dose of 2 mg, 21 days on, 7 days off.  Dexamethasone will be taken at a flat dose of 10 mg on a weekly basis.  We talked about the side effects of this regimen in detail.  He understands and gives his permission to proceed with the treatment.  We also talked about potential need for a port placement.  We will tentatively start treatment next week.  2.  Peripheral neuropathy: He has numbness and tingling in his feet and legs, extending up to his knees.  This has been stable since last visit when we cut back on Velcade.  He is taking Neurontin twice daily.  If he takes more, he gets lightheadedness and forgetfulness.  3.  Bone protection: -he is receiving Zometa every 8 weeks.  Tolerating it well.  4.  Back pain: -This is from underlying degenerative disc disease as well as multiple myeloma. - He is currently taking Percocet 5 mg given by Dr. Cindi Carbon  which is reportedly not helping.  He ran out of pills.  He is in a lot of pain particularly in the mornings and evenings. -I will give him oxycodone 10 mg to be taken every 8 hours as needed.      Orders placed this encounter:  Orders Placed This Encounter  Procedures  . CBC with Differential/Platelet  . Comprehensive metabolic panel  . Lactate dehydrogenase      Derek Jack, MD [place] [phone]

## 2017-09-09 NOTE — Progress Notes (Signed)
Patient not treated today.

## 2017-09-09 NOTE — Patient Instructions (Signed)
[facility] at [facility] Discharge Instructions  You saw Dr. Katragadda today.   Thank you for choosing [facility] at [facility] to provide your oncology and hematology care.  To afford each patient quality time with our provider, please arrive at least 15 minutes before your scheduled appointment time.   If you have a lab appointment with the Cancer Center please come in thru the  Main Entrance and check in at the main information desk  You need to re-schedule your appointment should you arrive 10 or more minutes late.  We strive to give you quality time with our providers, and arriving late affects you and other patients whose appointments are after yours.  Also, if you no show three or more times for appointments you may be dismissed from the clinic at the providers discretion.     Again, thank you for choosing [facility].  Our hope is that these requests will decrease the amount of time that you wait before being seen by our physicians.       _____________________________________________________________  Should you have questions after your visit to [facility], please contact our office at [phone] between the hours of 8:00 a.m. and 4:30 p.m.  Voicemails left after 4:00 p.m. will not be returned until the following business day.  For prescription refill requests, have your pharmacy contact our office and allow 72 hours.    Cancer Center Support Programs:   > Cancer Support Group  2nd Tuesday of the month 1pm-2pm, Journey Room    

## 2017-09-09 NOTE — Assessment & Plan Note (Signed)
1.  IgG kappa multiple myeloma: - Initially treated with RVD from 04/07/2014 through 07/28/2014, placed on maintenance Revlimid, which was discontinued 1 and half year ago, unknown reasons, patient thinks it was discontinued when he was receiving radiation. -MRI in January of the brain showed new right temporal lesion, status post radiation therapy, PET/CT scan in January 2019 showed improvement of multiple myeloma with no new lesions, started on weekly Velcade and dexamethasone on 02/18/2017, status post 2 cycles, Velcade changed to every other week on 05/06/2017 secondary to neuropathy, takes dexamethasone 20 mg on days of Velcade. - We reviewed the results of the M spike which was stable at 0.3.  Free light chain ratio went up to 4. -Because of persistent back pain, I have done an MRI of the thoracic and lumbar spine.  We discussed the results of the scans today.  He had a new rib lesion close to the spine on the left side, possibly eighth rib.  There is a soft tissue mass in that area.  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  There is also chronic L4 compression fracture which is stable. - Because of the new developments, I will discontinue Velcade.  I have recommended a much stronger regimen containing daratumumab, pomalidomide and dexamethasone.  Daratumumab will be given weekly for the first 8 weeks.  Pomalidomide will be started at a reduced dose of 2 mg, 21 days on, 7 days off.  Dexamethasone will be taken at a flat dose of 10 mg on a weekly basis.  We talked about the side effects of this regimen in detail.  He understands and gives his permission to proceed with the treatment.  We also talked about potential need for a port placement.  We will tentatively start treatment next week.  2.  Peripheral neuropathy: He has numbness and tingling in his feet and legs, extending up to his knees.  This has been stable since last visit when we cut back on Velcade.  He is taking Neurontin  twice daily.  If he takes more, he gets lightheadedness and forgetfulness.  3.  Bone protection: -he is receiving Zometa every 8 weeks.  Tolerating it well.  4.  Back pain: -This is from underlying degenerative disc disease as well as multiple myeloma. - He is currently taking Percocet 5 mg given by Dr. Cindi Carbon which is reportedly not helping.  He ran out of pills.  He is in a lot of pain particularly in the mornings and evenings. -I will give him oxycodone 10 mg to be taken every 8 hours as needed.

## 2017-09-09 NOTE — Progress Notes (Signed)
Patient on plan of care prior to pathways. 

## 2017-09-09 NOTE — Progress Notes (Signed)
START ON PATHWAY REGIMEN - Multiple Myeloma and Other Plasma Cell Dyscrasias     A cycle is every 28 days:     Pomalidomide      Dexamethasone      Daratumumab      Dexamethasone      Dexamethasone      Daratumumab      Dexamethasone      Dexamethasone      Daratumumab   **Always confirm dose/schedule in your pharmacy ordering system**  Patient Characteristics: Relapsed / Refractory, All Lines of Therapy R-ISS Staging: Unknown Disease Classification: Relapsed Line of Therapy: Second Line Intent of Therapy: Non-Curative / Palliative Intent, Discussed with Patient

## 2017-09-09 NOTE — Assessment & Plan Note (Signed)
Treatment of multiple myeloma in the palliative setting was discussed with the patient. 

## 2017-09-10 ENCOUNTER — Telehealth: Payer: Self-pay

## 2017-09-10 NOTE — Telephone Encounter (Signed)
Returned pt's VM left on behalf of pt's wife. Pt was questioning whether to cancel 09/22/17 appt with orthopedics for "injection in back". Conveyed to pt that recommendation could not be made until pt was evaluated in office by Dr. Sondra Come. Pt continued to question this RN on whether he should cancel appt with orthopedics. This RN again conveyed that recommendation could not be made until pt was evaluated in office by physician. Pt stated, "well, thanks for calling me back". Pt ended call. Loma Sousa, RN BSN

## 2017-09-10 NOTE — Progress Notes (Signed)
Histology and Location of Primary Cancer: IgG kappa multiple myeloma  Location(s) of Symptomatic tumor(s): Per MRI Thoracic spine 09/03/17:  Expansile lesion left posterior rib compatible with myeloma, not seen on prior PET. This extends into the extrapleural space.  Past/Anticipated chemotherapy by medical oncology, if any: Per Dr. Delton Coombes 09/09/17:  a much stronger regimen containing daratumumab, pomalidomide and dexamethasone.  Daratumumab will be given weekly for the first 8 weeks.  Pomalidomide will be started at a reduced dose of 2 mg, 21 days on, 7 days off.  Dexamethasone will be taken at a flat dose of 10 mg on a weekly basis  Patient's main complaints related to symptomatic tumor(s) are: pt states "hurt hurt hurt"  Pain on a scale of 0-10 is: pt states "its a good 9 or 10"/10    Ambulatory status? Walker? Wheelchair?: Pt uses a cane  SAFETY ISSUES:  Prior radiation? Multiple sites  Pacemaker/ICD? No  Possible current pregnancy? N/A  Is the patient on methotrexate? No  Additional Complaints / other details:  Pt c/o pain in back radiating down leg and describes it as cramping. Pt's pain associated with LEFT rib lesion is rated a 9-10/10. Pt c/o rash for last 3 days, systemic. Pt reports that it began after last chemotherapy treatment. Strongly encouraged pt to contact medical oncologist. Pt scored a "5" on distress screening and SW consult ordered per protocol. Pt is accompanied by wife and daughter.

## 2017-09-15 NOTE — Patient Instructions (Addendum)
[facility] Chemotherapy Teaching   You have been diagnosed with IgG Kappa multiple myeloma.  You are going to be treated with palliative intent.  This means that your multiply myeloma is treatable but not curable.  We are going to treat you with Darzalex (daratumumab) Pomalyst (pomalidomide) and Decadron (dexamethasone).  Darzalex will be given initially weekly for 8 weeks.  You will also take dexamethasone 10 mg once weekly. This can be taken on same day that you receive Darzalex.  You will also take an oral chemotherapy pill Pomalyst, it will be taken daily for 21 days and then you will have a period of 7 days where you do not take any medication.  Then you will start another 21 days.  You will see the doctor regularly throughout treatment.  We monitor your lab work prior to every treatment. The doctor monitors your response to treatment by the way you are feeling, your blood work, and scans periodically.  There will be wait times while you are here for treatment.  It will take about 30 minutes to 1 hour for your lab work to result.  Then there will be wait times while pharmacy mixes your medications.   *Be sure to take Dexamethasone 10 mg once per week. *Take Pomalyst daily for 21 days and then take 7 days off. *Stop taking Acyclovir on 9/15    You will be given the following premedications prior to each treatment:  Tylenol and Benadryl - to prevent allergic reaction to chemotherapy Decadron - steroid - given to reduce the risk of you having an allergic type reaction to the chemotherapy. Dexamethasone can cause you to feel energized, nervous/anxious/jittery, make you have trouble sleeping, and/or make you feel hot/flushed in the face/neck and/or look pink/red in the face/neck. These side effects will pass as the medication wears off. Compazine - to prevent nausea Singular - to prevent allergic reaction to chemotherapy   Daratumumab (Darzalex)  About This Drug Daratumumab is  used to treat cancer. It is given in the vein (IV).  Possible Side Effects . While you are getting this drug in your vein (IV), you may have a reaction to the drug. Sometimes you may be given medication to stop or lessen these side effects. Your nurse will check you closely for these signs: fever or shaking chills, flushing, facial swelling, feeling dizzy, headache, trouble breathing, rash, itching, chest tightness, or chest pain. These reactions may happen after your infusion. If this happens, call 911 for emergency care. . Decrease in the number of white blood cells and platelets. This may raise your risk of infection, and raise your risk of bleeding. . Fever and chills . Tiredness . Feeling dizzy . Trouble sleeping . Cough and trouble breathing . Upper respiratory infection . Nausea and throwing up (vomiting) . Loose bowel movements (diarrhea) . Constipation (not able to move bowels) . Muscle spasms . Pain in the joints . Back pain . Swelling of your legs, ankles and/or feet . Effects on the nerves are called peripheral neuropathy. You may feel numbness, tingling, or pain in your hands and feet. It may be hard for you to button your clothes, open jars, or walk as usual. The effect on the nerves may get worse with more doses of the drug. These effects get better in some people after the drug is stopped but it does not get better in all people.  Note: Each of the side effects above was reported in 20% or greater of patients  treated with daratumumab. Not all possible side effects are included above.  Warnings and Precautions . Severe decrease in the number of white blood cells and platelets . Severe reaction to the drug . This medication can affect the results of blood tests that match your blood type. Your blood type will be tested before treatment. Be sure to tell all healthcare providers you are taking this medicine before receiving blood transfusions, even for 6 months after  your last dose.  Important Information . This drug may be present in the saliva, tears, sweat, urine, stool, vomit, semen, and vaginal secretions. Talk to your doctor and/or your nurse about the necessary precautions to take during this time.  Treating Side Effects . Drink plenty of fluids (a minimum of eight glasses per day is recommended). . If you throw up or have loose bowel movements, you should drink more fluids so that you do not become dehydrated (lack of water in the body from losing too much fluid). . To help with nausea and vomiting, eat small, frequent meals instead of three large meals a day. Choose foods and drinks that are at room temperature. Ask your nurse or doctor about other helpful tips and medicine that is available to help stop or lessen these symptoms. . If you have diarrhea, eat low-fiber foods that are high in protein and calories and avoid foods that can irritate your digestive tracts or lead to cramping. . Ask your nurse or doctor about medicine that can lessen or stop your diarrhea or constipation. . If you are not able to move your bowels, check with your doctor or nurse before you use enemas, laxatives, or suppositories. . Manage tiredness by pacing your activities for the day. . Be sure to include periods of rest between energy-draining activities. . To decrease the risk of infection, wash your hands regularly. . Avoid close contact with people who have a cold, the flu, or other infections. . Take your temperature as your doctor or nurse tells you, and whenever you feel like you may have a fever. . To help decrease the risk of bleeding, use a soft toothbrush. Check with your nurse before using dental floss. . Be very careful when using knives or tools. . Use an electric shaver instead of a razor. Marland Kitchen Keeping your pain under control is important to your well-being. Please tell your doctor or nurse if you are experiencing pain. . If you are dizzy, get up  slowly after sitting or lying. . If you are having trouble sleeping, talk to your nurse or doctor on tips to help you sleep better. . If you have numbness and tingling in your hands and feet, be careful when cooking, walking, and handling sharp objects and hot liquids. . Infusion reactions may rarely occur after your infusion. If this happens, call 911 for emergency care.  Food and Drug Interactions . There are no known interactions of daratumumab with food. . This drug may interact with other medicines. Tell your doctor and pharmacist about all the prescription and over-the-counter medicines and dietary supplements (vitamins, minerals, herbs and others) that you are taking at this time. Also, check with your doctor or pharmacist before starting any new prescription or over-the-counter medicines, or dietary supplements to make sure that there are no interactions.  When to Call the Doctor Call your doctor or nurse if you have any of these symptoms and/or any new or unusual symptoms: . Fever of 100.4 F (38 C) or higher . Chills . Pain  in your chest . Coughing up yellow, green, or bloody mucus. . Wheezing or trouble breathing . Tiredness that interferes with your daily activities . Trouble falling or staying asleep . Feeling dizzy or lightheaded . Easy bleeding or bruising . Nausea that stops you from eating or drinking and/or is not relieved by prescribed medicines . Throwing up more than 3 times a day . No bowel movement in 3 days or when you feel uncomfortable. . Loose bowel movements (diarrhea) 4 times a day or loose bowel movements with lack of strength or a feeling of being dizzy . Weight gain of 5 pounds in one week (fluid retention) . Swelling of your legs, ankles and/or feet . Pain that does not go away, or is not relieved by prescribed medicines . Signs of infusion reaction: fever or shaking chills, flushing, facial swelling, feeling dizzy, headache, trouble breathing,  rash, itching, chest tightness, or chest pain. If this happens, call 911 for emergency care. . Numbness, tingling, or pain in your hands and feet . If you think you may be pregnant or may have impregnated your partner  Reproduction Warnings . Pregnancy warning: This drug may have harmful effects on the unborn baby. Women of childbearing potential should use effective methods of birth control during your cancer treatment and for at least 3 months after treatment. Let your doctor know right away if you think you may be pregnant. . Breastfeeding warning: It is not known if this drug passes into breast milk. For this reason, women should talk to their doctor about the risks and benefits of breastfeeding during treatment with this drug because this drug may enter the breast milk and cause harm to a breastfeeding baby. . Fertility warning: Human fertility studies have not been done with this drug. Talk with your doctor or nurse if you plan to have children. Ask for information on sperm or egg banking.  Dexamethasone (Decadron)  About This Drug Dexamethasone is used to treat cancer, to decrease inflammation and sometimes used before and after chemotherapy to prevent or treat nausea and/or vomiting. It is given in the vein (IV) or orally (by mouth).  Possible Side Effects . Headache . High blood pressure . Abnormal heart beat . Tiredness and weakness . Changes in mood, which may include depression or a feeling of extreme well-being . Trouble sleeping . Increased sweating . Increased appetite (increased hunger) . Weight gain . Increase risk of infections . Pain in your abdomen . Nausea . Skin changes such as rash, dryness, redness . Blood sugar levels may change . Electrolyte changes . Swelling of your legs, ankles and/or feet . Changes in your liver function . You may be at risk for cataracts, glaucoma or infections of the eye . Muscle loss and / or weakness (lack of muscle  strength) . Increased risk of developing osteoporosis- your bones may become weak and brittle Note: Not all possible side effects are included above.  Warnings and Precautions . This drug may cause you to feel irritable, nervous or restless. . Allergic reactions, including anaphylaxis are rare but may happen in some patients. Signs of allergic reaction to this drug may be swelling of the face, feeling like your tongue or throat are swelling, trouble breathing, rash, itching, fever, chills, feeling dizzy, and/or feeling that your heart is beating in a fast or not normal way. If this happens, do not take another dose of this drug. You should get urgent medical treatment. . High blood pressure and changes in electrolytes,  which can cause fluid build-up around your heart, lungs or elsewhere. . Increased risk of developing a hole in your stomach, small, and/or large intestine if you have ulcers in the lining of your stomach and/or intestine, or have diverticulitis, ulcerative colitis and/or other diseases that affect the gastrointestinal tract. . Effects on the endocrine glands including the pituitary, adrenals or thyroid during or after use of this medication. . Changes in the tissue of the heart, that can cause your heart to have less ability to pump blood. You may be short of breath or our arms, hands, legs and feet may swell. . Increased risk of heart attack. . Severe depression and other psychiatric disorders such as mood changes. . Burning, pain and itching around your anus may happen when this drug is given in the vein too rapidly (IV). It usually happens suddenly and resolves in less than 1 minute.  Important Information . Talk to your doctor or your nurse before stopping this medication, it should be stopped gradually. Depending on the dose and length of treatment, you could experience serious side effects if stopped abruptly (suddenly). . Talk to your doctor before receiving any  vaccinations during your treatment. Some vaccinations are not recommended while receiving dexamethasone.  How to Take Your Medication . For Oral (by mouth): You can take the medicine with or without food. If you have nausea or upset stomach, take it with food. . Missed dose: If you miss a dose, do not take 2 doses at the same time or extra doses. . If you vomit a dose, take your next dose at the regular time. Do not take 2 doses at the same time . Handling: Wash your hands after handling your medicine, your caretakers should not handle your medicine with bare hands and should wear latex gloves. . Storage: Store this medicine in the original container at room temperature. Protect from moisture and light. Discuss with your nurse or your doctor how to dispose of unused medicine.  Treating Side Effects . Drink plenty of fluids (a minimum of eight glasses per day is recommended). . To help with nausea and vomiting, eat small, frequent meals instead of three large meals a day. Choose foods and drinks that are at room temperature. Ask your nurse or doctor about other helpful tips and medicine that is available to help stop or lessen these symptoms. . If you throw up, you should drink more fluids so that you do not become dehydrated (lack of water in the body from losing too much fluid). . Manage tiredness by pacing your activities for the day. . Be sure to include periods of rest between energy-draining activities. . To help with muscle weakness, get regular exercise. If you feel too tired to exercise vigorously, try taking a short walk. . If you are having trouble sleeping, talk to your nurse or doctor on tips to help you sleep better. . If you are feeling depressed, talk to your nurse or doctor about it. Marland Kitchen Keeping your pain under control is important to your well-being. Please tell your doctor or nurse if you are experiencing pain. . If you have diabetes, keep good control of your blood sugar  level. Tell your nurse or your doctor if your glucose levels are higher or lower than normal. . To decrease the risk of infection, wash your hands regularly. . Avoid close contact with people who have a cold, the flu, or other infections. . Take your temperature as your doctor or nurse tells you,  and whenever you feel like you may have a fever. . If you get a rash do not put anything on it unless your doctor or nurse says you may. Keep the area around the rash clean and dry. Ask your doctor for medicine if your rash bothers you. . Moisturize your skin several times day. . Avoid sun exposure and apply sunscreen routinely when outdoors.  Food and Drug Interactions . There are no known interactions of dexamethasone with food. . Check with your doctor or pharmacist about all other prescription medicines and over-the-counter medicines and dietary supplements (vitamins, minerals, herbs and others) you are taking before starting this medicine as there are known drug interactions with dexamethasone. Also, check with your doctor or pharmacist before starting any new prescription or over-the-counter medicines, or dietary supplement to make sure that there are no interactions. . There are known interactions of dexamethasone with other medicines and products like acetaminophen, aspirin, and ibuprofen. Ask your doctor what over-the-counter (OTC) medicines you can take.  When to Call the Doctor Call your doctor or nurse if you have any of these symptoms and/or any new or unusual symptoms: . Fever of 100.4 F (38 C) or higher . Chills . A headache that does not go away . Trouble breathing . Blurry vision or other changes in eyesight . Feel irritable, nervous or restless . Trouble falling or staying asleep . Severe mood changes such as depression or unusual thoughts and/or behaviors . Thoughts of hurting yourself or others, and suicide . Tiredness that interferes with your daily activities .  Feeling that your heart is beating in a fast, slow or not normal way . Feeling dizzy or lightheaded . Chest pain or symptoms of a heart attack. Most heart attacks involve pain in the center of the chest that lasts more than a few minutes. The pain may go away and come back, or it can be constant. It can feel like pressure, squeezing, fullness, or pain. Sometimes pain is felt in one or both arms, the back, neck, jaw, or stomach. If any of these symptoms last 2 minutes, call 911. Marland Kitchen Heartburn or indigestion . Nausea that stops you from eating or drinking and/or is not relieved by prescribed medicines . Throwing up more than 3 times a day . Pain in your abdomen that does not go away . Abnormal blood sugar . Unusual thirst, passing urine often, headache, sweating, shakiness, irritability . Swelling of legs, ankles, or feet . Weight gain of 5 pounds in one week (fluid retention) . Signs of possible liver problems: dark urine, pale bowel movements, bad stomach pain, feeling very tired and weak, unusual itching, or yellowing of the eyes or skin . Severe muscle weakness . A new rash or a rash that is not relieved by prescribed medicines . Signs of allergic reaction: swelling of the face, feeling like your tongue or throat are swelling, trouble breathing, rash, itching, fever, chills, feeling dizzy, and/or feeling that your heart is beating in a fast or not normal way. If this happens, call 911 for emergency care. . If you think you may be pregnant  Reproduction Warnings . Pregnancy warning: It is not known if this drug may harm an unborn child. For this reason, be sure to talk with your doctor if you are pregnant or planning to become pregnant while receiving this drug. Let your doctor know right away if you think you may be pregnant or may have impregnated your partner. . Breastfeeding warning: It is not  known if this drug passes into breast milk. For this reason, women should talk to their  doctor about the risks and benefits of breastfeeding during treatment with this drug because this drug may enter the breast milk and cause harm to a breastfeeding baby. . Fertility warning: Human fertility studies have not been done with this drug. Talk with your doctor or nurse if you plan to have children. Ask for information on sperm banking.  Pomalidomide (Pomalyst)  About This Drug Pomalidomide is used to treat cancer. It is given orally (by mouth).  Possible Side Effects . A decrease in the number of white blood cells. This may raise your risk of infection. . A decrease in the number of red blood cells. This may make you tired and weak. . Nausea . Diarrhea (loose bowel movements) . Constipation (unable to move bowels) . Fever . Tiredness and weakness . Back pain . Upper respiratory infection . Trouble breathing  Note: Each of the side effects above was reported in 30% or greater of patients treated with pomalidomide. Not all possible side effects are included above.  Warnings and Precautions . Blood clots and events such as stroke and heart attack. A blood clot in your leg may cause your leg to swell, appear red and warm, and/or cause pain. A blood clot in your lungs may cause trouble breathing, pain when breathing, and/or chest pain . Severe bone marrow suppression. . Changes in your liver function, which can cause liver failure and be life-threatening . Allergic reactions, including anaphylaxis are rare but may happen in some patients. Signs of allergic reaction to this drug may be swelling of the face, feeling like your tongue or throat are swelling, trouble breathing, rash, itching, fever, chills, feeling dizzy, and/or feeling that your heart is beating in a fast or not normal way. If this happens, do not take another dose of this drug. You should get urgent medical treatment. . Severe allergic skin reaction. You may develop blisters on your skin that are filled with  fluid or a severe red rash all over your body that may be painful. . Confusion and/or feeling dizzy, which may impair your ability to drive or use machinery. Use caution and tell your nurse or doctor if you feel dizzy or confused. . Effects on the nerves are called peripheral neuropathy. You may feel numbness, tingling, or pain in your hands and feet. It may be hard for you to button your clothes, open jars, or walk as usual. The effect on the nerves may get worse with more doses of the drug. These effects get better in some people after the drug is stopped but it does not get better in all people. . Tumor lysis syndrome: This drug may act on the cancer cells very quickly. This may affect how your kidneys work and can be life-threatening. . This drug may raise your risk of getting a second cancer.  Note: Some of the side effects above are very rare. If you have concerns and/or questions, please discuss them with your medical team.  Important Information . You will need to sign up for a special program called Pomalyst REMS when you start taking this drug. Your nurse will help you get started. . Do not donate blood during your treatment and for 4 weeks after your treatment . Men should not donate sperm during your treatment because this drug is present in semen and may cause harm to a baby. . Avoid smoking while taking pomalidomide as this  may lower the levels of the drug in your body, which can make it less effective.  How to Take Your Medication . Swallow the medicine whole with water, with or without food. Do not chew, break, or open it. . Take this medicine as directed at the same time each day. . Missed dose: If you miss a dose, take it as soon as you think about it ONLY if it has been less than 12 hours since your regular time. If it has been more than 12 hours, skip the missed dose and contact your doctor. Take your next dose at the regular time. Do not take 2 doses at the same  time and do not double up on the next dose. . If you vomit a dose, take your next dose at the regular time. . Handling: Wash your hands after handling your medicine; your caretakers should not handle your medicine with bare hands and should wear latex gloves. . If you get any of the content of a broken capsules on your skin or inside of your mouth, you should wash the area of the skin well with soap and water right away. Flush your mouth with flowing water. Call your doctor if you get a skin reaction. . This drug may be present in the saliva, tears, sweat, urine, stool, vomit, semen, and vaginal secretions. Talk to your doctor and/or your nurse about the necessary precautions to take during this time. . Storage: Store this medicine in the original container at room temperature. . Disposal of unused medicine: Do not flush any expired and/or unused medicine down the toilet or drain unless you are specifically instructed to do so on the medication label. Some facilities have take-back programs and/or other options. If you do not have a take-back program in your area, then please discuss with your nurse or your doctor how to dispose of unused medicine.  Treating Side Effects . Manage tiredness by pacing your activities for the day. . Be sure to include periods of rest between energy-draining activities. . To decrease the risk of infection, wash your hands regularly. . Avoid close contact with people who have a cold, the flu, or other infections. . Take your temperature as your doctor or nurse tells you, and whenever you feel like you may have a fever. . To help decrease bleeding, use a soft toothbrush. Check with your nurse before using dental floss. . Be very careful when using knives or tools. . Use an electric shaver instead of a razor. . Drink plenty of fluids (a minimum of eight glasses per day is recommended). . If you throw up or have loose bowel movements, you should drink more fluids  so that you do not become dehydrated (lack of water in the body from losing too much fluid). . If you have diarrhea, eat low-fiber foods that are high in protein and calories and avoid foods that can irritate your digestive tracts or lead to cramping. . Ask your nurse or doctor about medicine that can lessen or stop your diarrhea and/or constipation . To help with nausea and vomiting, eat small, frequent meals instead of three large meals a day. Choose foods and drinks that are at room temperature. Ask your nurse or doctor about other helpful tips and medicine that is available to help stop or lessen these symptoms. . If you are not able to move your bowels, check with your doctor or nurse before you use enemas, laxatives, or suppositories. Marland Kitchen Keeping your pain under control  is important to your well-being. Please tell your doctor or nurse if you are experiencing pain. . If you have numbness and tingling in your hands and feet, be careful when cooking, walking, and handling sharp objects and hot liquids. . If you get a rash do not put anything on it unless your doctor or nurse says you may. Keep the area around the rash clean and dry. Ask your doctor for medicine if your rash bothers you.  Food and Drug Interactions . There are no known interactions of pomalidomide with food. . Check with your doctor or pharmacist about all other prescription medicines and over-the-counter medicines and dietary supplements (vitamins, minerals, herbs and others) you are taking before starting this medicine as there are known drug interactions with pomalidomide. Also, check with your doctor or pharmacist before starting any new prescription or over-the-counter medicines, or dietary supplements to make sure that there are no interactions.  When to Call the Doctor Call your doctor or nurse if you have any of these symptoms and/or any new or unusual symptoms: . Fever of 100.4 F (38 C) or higher . Chills .  Tiredness that interferes with your daily activities . Feeling dizzy or lightheaded . Easy bleeding or bruising . Your leg or arm is swollen, red, warm and/or painful . Chest pain or symptoms of a heart attack. Most heart attacks involve pain in the center of the chest that lasts more than a few minutes. The pain may go away and come back. It can feel like pressure, squeezing, fullness, or pain. Sometimes pain is felt in one or both arms, the back, neck, jaw, or stomach. If any of these symptoms last 2 minutes, call 911. Marland Kitchen Symptoms of a stroke such as sudden numbness or weakness of your face, arm, or leg, mostly on one side of your body; sudden confusion, trouble speaking or understanding; sudden trouble seeing in one or both eyes; sudden trouble walking, feeling dizzy, loss of balance or coordination; or sudden, bad headache with no known cause. If you have any of these symptoms for 2 minutes, call 911. . Wheezing or trouble breathing . Coughing up yellow, green, or bloody mucus . Nausea that stops you from eating or drinking and/or is not relieved by prescribed medicines . Diarrhea, 4 times in one day or diarrhea with lack of strength or a feeling of being dizzy . No bowel movement in 3 days or when you feel uncomfortable . Lasting loss of appetite or rapid weight loss of five pounds in a week . Pain that does not go away, or is not relieved by prescribed medicines . Signs of possible liver problems: dark urine, pale bowel movements, bad stomach pain, feeling very tired and weak, unusual itching, or yellowing of the eyes or skin . Signs of tumor lysis: Confusion or agitation, decreased urine, nausea/vomiting, diarrhea, muscle cramping, numbness and/or tingling, seizures. . Signs of allergic reaction: swelling of the face, feeling like your tongue or throat are swelling, trouble breathing, rash, itching, fever, chills, feeling dizzy, and/or feeling that your heart is beating in a fast or  not normal way. If this happens, call 911 for emergency care. . Numbness, tingling, or pain in your hands and feet . Flu-like symptoms: fever, headache, muscle and joint aches, and fatigue (low energy, feeling weak) . A new rash or a rash that is not relieved by prescribed medicines . If you think you may be pregnant or may have impregnated your partner  Reproduction Warnings .  Pregnancy warning: This drug can have harmful effects on the unborn baby. Women of child bearing potential should use 2 effective methods of birth control, one of which must be a highly effective method of birth control, beginning 4 weeks before treatment starts, during your cancer treatment, including dose interruptions, and for at least 4 weeks after treatment. A highly effective method of birth control includes tubal ligation, intra-uterine device (IUD), hormonal (birth control   SELF CARE ACTIVITIES WHILE ON CHEMOTHERAPY:  Hydration Increase your fluid intake 48 hours prior to treatment and drink at least 8 to 12 cups (64 ounces) of water/decaffeinated beverages per day after treatment. You can still have your cup of coffee or soda but these beverages do not count as part of your 8 to 12 cups that you need to drink daily. No alcohol intake.  Medications Continue taking your normal prescription medication as prescribed.  If you start any new herbal or new supplements please let us know first to make sure it is safe.  Mouth Care Have teeth cleaned professionally before starting treatment. Keep dentures and partial plates clean. Use soft toothbrush and do not use mouthwashes that contain alcohol. Biotene is a good mouthwash that is available at most pharmacies or may be ordered by calling [phone]. Use warm salt water gargles (1 teaspoon salt per 1 quart warm water) before and after meals and at bedtime. Or you may rinse with 2 tablespoons of three-percent hydrogen peroxide mixed in eight ounces of water. If you  are still having problems with your mouth or sores in your mouth please call the clinic. If you need dental work, please let the doctor know before you go for your appointment so that we can coordinate the best possible time for you in regards to your chemo regimen. You need to also let your dentist know that you are actively taking chemo. We may need to do labs prior to your dental appointment.  Skin Care Always use sunscreen that has not expired and with SPF (Sun Protection Factor) of 50 or higher. Wear hats to protect your head from the sun. Remember to use sunscreen on your hands, ears, face, & feet.  Use good moisturizing lotions such as udder cream, eucerin, or even Vaseline. Some chemotherapies can cause dry skin, color changes in your skin and nails.    . Avoid long, hot showers or baths. . Use gentle, fragrance-free soaps and laundry detergent. . Use moisturizers, preferably creams or ointments rather than lotions because the thicker consistency is better at preventing skin dehydration. Apply the cream or ointment within 15 minutes of showering. Reapply moisturizer at night, and moisturize your hands every time after you wash them.  Hair Loss (if your doctor says your hair will fall out)  . If your doctor says that your hair is likely to fall out, decide before you begin chemo whether you want to wear a wig. You may want to shop before treatment to match your hair color. . Hats, turbans, and scarves can also camouflage hair loss, although some people prefer to leave their heads uncovered. If you go bare-headed outdoors, be sure to use sunscreen on your scalp. . Cut your hair short. It eases the inconvenience of shedding lots of hair, but it also can reduce the emotional impact of watching your hair fall out. . Don't perm or color your hair during chemotherapy. Those chemical treatments are already damaging to hair and can enhance hair loss. Once your chemo treatments are done  and your hair  has grown back, it's OK to resume dyeing or perming hair. With chemotherapy, hair loss is almost always temporary. But when it grows back, it may be a different color or texture. In older adults who still had hair color before chemotherapy, the new growth may be completely gray.  Often, new hair is very fine and soft.  Infection Prevention Please wash your hands for at least 30 seconds using warm soapy water. Handwashing is the #1 way to prevent the spread of germs. Stay away from sick people or people who are getting over a cold. If you develop respiratory systems such as green/yellow mucus production or productive cough or persistent cough let us know and we will see if you need an antibiotic. It is a good idea to keep a pair of gloves on when going into grocery stores/[employer] to decrease your risk of coming into contact with germs on the carts, etc. Carry alcohol hand gel with you at all times and use it frequently if out in public. If your temperature reaches 100.5 or higher please call the clinic and let us know.  If it is after hours or on the weekend please go to the ER if your temperature is over 100.5.  Please have your own personal thermometer at home to use.    Sex and bodily fluids If you are going to have sex, a condom must be used to protect the person that isn't taking chemotherapy. Chemo can decrease your libido (sex drive). For a few days after chemotherapy, chemotherapy can be excreted through your bodily fluids.  When using the toilet please close the lid and flush the toilet twice.  Do this for a few day after you have had chemotherapy.   Effects of chemotherapy on your sex life Some changes are simple and won't last long. They won't affect your sex life permanently. Sometimes you may feel: . too tired . not strong enough to be very active . sick or sore  . not in the mood . anxious or low Your anxiety might not seem related to sex. For example, you may be worried about the  cancer and how your treatment is going. Or you may be worried about money, or about how you family are coping with your illness. These things can cause stress, which can affect your interest in sex. It's important to talk to your partner about how you feel. Remember - the changes to your sex life don't usually last long. There's usually no medical reason to stop having sex during chemo. The drugs won't have any long term physical effects on your performance or enjoyment of sex. Cancer can't be passed on to your partner during sex  Contraception It's important to use reliable contraception during treatment. Avoid getting pregnant while you or your partner are having chemotherapy. This is because the drugs may harm the baby. Sometimes chemotherapy drugs can leave a man or woman infertile.  This means you would not be able to have children in the future. You might want to talk to someone about permanent infertility. It can be very difficult to learn that you may no longer be able to have children. Some people find counselling helpful. There might be ways to preserve your fertility, although this is easier for men than for women. You may want to speak to a fertility expert. You can talk about sperm banking or harvesting your eggs. You can also ask about other fertility options, such as donor eggs. If  you have or have had breast cancer, your doctor might advise you not to take the contraceptive pill. This is because the hormones in it might affect the cancer.  It is not known for sure whether or not chemotherapy drugs can be passed on through semen or secretions from the vagina. Because of this some doctors advise people to use a barrier method if you have sex during treatment. This applies to vaginal, anal or oral sex. Generally, doctors advise a barrier method only for the time you are actually having the treatment and for about a week after your treatment. Advice like this can be worrying, but this does not  mean that you have to avoid being intimate with your partner. You can still have close contact with your partner and continue to enjoy sex.  Animals If you have cats or birds we just ask that you not change the litter or change the cage.  Please have someone else do this for you while you are on chemotherapy.   Food Safety During and After Cancer Treatment Food safety is important for people both during and after cancer treatment. Cancer and cancer treatments, such as chemotherapy, radiation therapy, and stem cell/bone marrow transplantation, often weaken the immune system. This makes it harder for your body to protect itself from foodborne illness, also called food poisoning. Foodborne illness is caused by eating food that contains harmful bacteria, parasites, or viruses.  Foods to avoid Some foods have a higher risk of becoming tainted with bacteria. These include: Marland Kitchen Unwashed fresh fruit and vegetables, especially leafy vegetables that can hide dirt and other contaminants . Raw sprouts, such as alfalfa sprouts . Raw or undercooked beef, especially ground beef, or other raw or undercooked meat and poultry . Fatty, fried, or spicy foods immediately before or after treatment.  These can sit heavy on your stomach and make you feel nauseous. . Raw or undercooked shellfish, such as oysters. . Sushi and sashimi, which often contain raw fish.  . Unpasteurized beverages, such as unpasteurized fruit juices, raw milk, raw yogurt, or cider . Undercooked eggs, such as soft boiled, over easy, and poached; raw, unpasteurized eggs; or foods made with raw egg, such as homemade raw cookie dough and homemade mayonnaise Simple steps for food safety Shop smart. . Do not buy food stored or displayed in an unclean area. . Do not buy bruised or damaged fruits or vegetables. . Do not buy cans that have cracks, dents, or bulges. . Pick up foods that can spoil at the end of your shopping trip and store them in a  cooler on the way home. Prepare and clean up foods carefully. . Rinse all fresh fruits and vegetables under running water, and dry them with a clean towel or paper towel. . Clean the top of cans before opening them. . After preparing food, wash your hands for 20 seconds with hot water and soap. Pay special attention to areas between fingers and under nails. . Clean your utensils and dishes with hot water and soap. Marland Kitchen Disinfect your kitchen and cutting boards using 1 teaspoon of liquid, unscented bleach mixed into 1 quart of water.   Dispose of old food. . Eat canned and packaged food before its expiration date (the "use by" or "best before" date). . Consume refrigerated leftovers within 3 to 4 days. After that time, throw out the food. Even if the food does not smell or look spoiled, it still may be unsafe. Some bacteria, such as Listeria,  can grow even on foods stored in the refrigerator if they are kept for too long. Take precautions when eating out. . At restaurants, avoid buffets and salad bars where food sits out for a long time and comes in contact with many people. Food can become contaminated when someone with a virus, often a norovirus, or another "bug" handles it. . Put any leftover food in a "to-go" container yourself, rather than having the server do it. And, refrigerate leftovers as soon as you get home. . Choose restaurants that are clean and that are willing to prepare your food as you order it cooked.   MEDICATIONS:   Compazine/Prochlorperazine 62m tablet. Take 1 tablet every 6 hours as needed for nausea/vomiting. (This can make you sleepy)   EMLA cream. Apply a quarter size amount to port site 1 hour prior to chemo. Do not rub in. Cover with plastic wrap.   Over-the-Counter Meds:  Colace - 100 mg capsules - take 2 capsules daily.  If this doesn't help then you can increase to 2 capsules twice daily.  Call u[place]oreaif this does not help your bowels move.   Imodium 219m capsule. Take 2 capsules after the 1st loose stool and then 1 capsule every 2 hours until you go a total of 12 hours without having a loose stool. Call the Ca[facility]f loose stools continue. If diarrhea occurs at bedtime, take 2 capsules at bedtime. Then take 2 capsules every 4 hours until morning. Call [facility] [place]   Diarrhea Sheet   If you are having loose stools/diarrhea, please purchase Imodium and begin taking as outlined:  At the first sign of poorly formed or loose stools you should begin taking Imodium (loperamide) 2 mg capsules.  Take two caplets (64m5mfollowed by one caplet (2mg45mvery 2 hours until you have had no diarrhea for 12 hours.  During the night take two caplets (64mg)70m bedtime and continue every 4 hours during the night until the morning.  Stop taking Imodium only after there is no sign of diarrhea for 12 hours.    Always call the CanceChristianou are having loose stools/diarrhea that you can't get under control.  Loose stools/diarrhea leads to dehydration (loss of water) in your body.  We have other options of trying to get the loose stools/diarrhea to stop but you must let us knKorea!   Constipation Sheet  Colace - 100 mg capsules - take 2 capsules daily.  If this doesn't help then you can increase to 2 capsules twice daily.  Please call if the above does not work for you.   Do not go more than 2 days without a bowel movement.  It is very important that you do not become constipated.  It will make you feel sick to your stomach (nausea) and can cause abdominal pain and vomiting.   Nausea Sheet   Compazine/Prochlorperazine 10mg 64met. Take 1 tablet every 6 hours as needed for nausea/vomiting. (This can make you sleepy)  If you are having persistent nausea (nausea that does not stop) please call the [facility] us knoKoreathe amount of nausea that you are experiencing.  If you begin to vomit, you need to call the [facility] it is the weekend and  you have vomited more than one time and can't get it to stop-go to the Emergency Room.  Persistent nausea/vomiting can lead to dehydration (loss of fluid in your body) and will make you feel  terrible.   Ice chips, sips of clear liquids, foods that are @ room temperature, crackers, and toast tend to be better tolerated.   SYMPTOMS TO REPORT AS SOON AS POSSIBLE AFTER TREATMENT:   FEVER GREATER THAN 100.5 F  CHILLS WITH OR WITHOUT FEVER  NAUSEA AND VOMITING THAT IS NOT CONTROLLED WITH YOUR NAUSEA MEDICATION  UNUSUAL SHORTNESS OF BREATH  UNUSUAL BRUISING OR BLEEDING  TENDERNESS IN MOUTH AND THROAT WITH OR WITHOUT PRESENCE OF ULCERS  URINARY PROBLEMS  BOWEL PROBLEMS  UNUSUAL RASH      Wear comfortable clothing and clothing appropriate for easy access to any Portacath or PICC line. Let us know if there is anything that we can do to make your therapy better!    What to do if you need assistance after hours or on the weekends: CALL [phone].  HOLD on the line, do not hang up.  You will hear multiple messages but at the end you will be connected with a nurse triage line.  They will contact the doctor if necessary.  Most of the time they will be able to assist you.  Do not call the hospital operator.      I have been informed and understand all of the instructions given to me and have received a copy. I have been instructed to call the clinic [phone] or my family physician as soon as possible for continued medical care, if indicated. I do not have any more questions at this time but understand that I may call the [facility] or the Patient Navigator at [phone] during office hours should I have questions or need assistance in obtaining follow-up care.

## 2017-09-16 ENCOUNTER — Ambulatory Visit (HOSPITAL_COMMUNITY): Payer: Medicare Other | Admitting: Hematology

## 2017-09-16 MED ORDER — LIDOCAINE-PRILOCAINE 2.5-2.5 % EX CREA: TOPICAL_CREAM | CUTANEOUS | 0 refills | Status: DC

## 2017-09-16 MED ORDER — DEXAMETHASONE 4 MG PO TABS: 10.0000 mg | ORAL_TABLET | ORAL | 12 refills | Status: DC

## 2017-09-17 ENCOUNTER — Other Ambulatory Visit: Payer: Self-pay

## 2017-09-17 ENCOUNTER — Inpatient Hospital Stay (HOSPITAL_COMMUNITY): Payer: Medicare Other

## 2017-09-17 ENCOUNTER — Inpatient Hospital Stay (HOSPITAL_COMMUNITY): Payer: Medicare Other | Admitting: Hematology

## 2017-09-17 VITALS — BP 122/58 | HR 86 | Temp 97.9°F | Resp 18

## 2017-09-17 VITALS — BP 142/69 | HR 75 | Temp 97.8°F | Resp 18 | Wt 244.6 lb

## 2017-09-17 DIAGNOSIS — Z5112 Encounter for antineoplastic immunotherapy: Secondary | ICD-10-CM | POA: Diagnosis not present

## 2017-09-17 DIAGNOSIS — M549 Dorsalgia, unspecified: Secondary | ICD-10-CM | POA: Diagnosis not present

## 2017-09-17 DIAGNOSIS — C9000 Multiple myeloma not having achieved remission: Secondary | ICD-10-CM

## 2017-09-17 DIAGNOSIS — G620 Drug-induced polyneuropathy: Secondary | ICD-10-CM | POA: Diagnosis not present

## 2017-09-17 DIAGNOSIS — M5136 Other intervertebral disc degeneration, lumbar region: Secondary | ICD-10-CM | POA: Diagnosis not present

## 2017-09-17 DIAGNOSIS — R21 Rash and other nonspecific skin eruption: Secondary | ICD-10-CM | POA: Diagnosis not present

## 2017-09-17 LAB — COMPREHENSIVE METABOLIC PANEL
ALT: 14 U/L (ref 0–44)
AST: 18 U/L (ref 15–41)
Albumin: 3.7 g/dL (ref 3.5–5.0)
Alkaline Phosphatase: 25 U/L — ABNORMAL LOW (ref 38–126)
Anion gap: 9 (ref 5–15)
BUN: 18 mg/dL (ref 8–23)
CO2: 25 mmol/L (ref 22–32)
Calcium: 8.9 mg/dL (ref 8.9–10.3)
Chloride: 104 mmol/L (ref 98–111)
Creatinine, Ser: 0.83 mg/dL (ref 0.61–1.24)
GFR calc Af Amer: >60 mL/min (ref >60)
GFR calc non Af Amer: >60 mL/min (ref >60)
Glucose, Bld: 110 mg/dL — ABNORMAL HIGH (ref 70–99)
Potassium: 4.3 mmol/L (ref 3.5–5.1)
Sodium: 138 mmol/L (ref 135–145)
Total Bilirubin: 1.2 mg/dL (ref 0.3–1.2)
Total Protein: 6.5 g/dL (ref 6.5–8.1)

## 2017-09-17 LAB — CBC WITH DIFFERENTIAL/PLATELET
Basophils Absolute: 0 10*3/uL (ref 0.0–0.1)
Basophils Relative: 0 %
Eosinophils Absolute: 0 10*3/uL (ref 0.0–0.7)
Eosinophils Relative: 0 %
HCT: 43.4 % (ref 39.0–52.0)
Hemoglobin: 14.9 g/dL (ref 13.0–17.0)
Lymphocytes Relative: 20 %
Lymphs Abs: 1 10*3/uL (ref 0.7–4.0)
MCH: 32.5 pg (ref 26.0–34.0)
MCHC: 34.3 g/dL (ref 30.0–36.0)
MCV: 94.6 fL (ref 78.0–100.0)
Monocytes Absolute: 0.7 10*3/uL (ref 0.1–1.0)
Monocytes Relative: 14 %
Neutro Abs: 3.3 10*3/uL (ref 1.7–7.7)
Neutrophils Relative %: 66 %
Platelets: 126 10*3/uL — ABNORMAL LOW (ref 150–400)
RBC: 4.59 MIL/uL (ref 4.22–5.81)
RDW: 13.3 % (ref 11.5–15.5)
WBC: 5.1 10*3/uL (ref 4.0–10.5)

## 2017-09-17 LAB — LACTATE DEHYDROGENASE: LDH: 127 U/L (ref 98–192)

## 2017-09-17 MED ORDER — ACETAMINOPHEN 325 MG PO TABS
650.0000 mg | ORAL_TABLET | Freq: Once | ORAL | Status: AC
  Administered 2017-09-17: 650 mg via ORAL
  Filled 2017-09-17: qty 2

## 2017-09-17 MED ORDER — SODIUM CHLORIDE 0.9 % IV SOLN
15.9000 mg/kg | Freq: Once | INTRAVENOUS | Status: AC
  Administered 2017-09-17: 1800 mg via INTRAVENOUS
  Filled 2017-09-17: qty 80

## 2017-09-17 MED ORDER — SODIUM CHLORIDE 0.9 % IV SOLN
Freq: Once | INTRAVENOUS | Status: AC
  Administered 2017-09-17: 09:00:00 via INTRAVENOUS

## 2017-09-17 MED ORDER — DIPHENHYDRAMINE HCL 25 MG PO CAPS
50.0000 mg | ORAL_CAPSULE | Freq: Once | ORAL | Status: AC
  Administered 2017-09-17: 50 mg via ORAL
  Filled 2017-09-17: qty 2

## 2017-09-17 MED ORDER — SODIUM CHLORIDE 0.9 % IV SOLN
20.0000 mg | Freq: Once | INTRAVENOUS | Status: AC
  Administered 2017-09-17: 20 mg via INTRAVENOUS
  Filled 2017-09-17: qty 2

## 2017-09-17 MED ORDER — MONTELUKAST SODIUM 10 MG PO TABS
10.0000 mg | ORAL_TABLET | Freq: Once | ORAL | Status: AC
  Administered 2017-09-17: 10 mg via ORAL
  Filled 2017-09-17: qty 1

## 2017-09-17 MED ORDER — PROCHLORPERAZINE MALEATE 10 MG PO TABS
10.0000 mg | ORAL_TABLET | Freq: Once | ORAL | Status: AC
  Administered 2017-09-17: 10 mg via ORAL
  Filled 2017-09-17: qty 1

## 2017-09-17 NOTE — Patient Instructions (Signed)
[place] at [facility] Discharge Instructions  Follow up in 1 week with treatment and labs.    Thank you for choosing [place] at [facility] to provide your oncology and hematology care.  To afford each patient quality time with our provider, please arrive at least 15 minutes before your scheduled appointment time.   If you have a lab appointment with the [facility] please come in thru the  Main Entrance and check in at the main information desk  You need to re-schedule your appointment should you arrive 10 or more minutes late.  We strive to give you quality time with our providers, and arriving late affects you and other patients whose appointments are after yours.  Also, if you no show three or more times for appointments you may be dismissed from the clinic at the providers discretion.     Again, thank you for choosing [facility].  Our hope is that these requests will decrease the amount of time that you wait before being seen by our physicians.       _____________________________________________________________  Should you have questions after your visit to [facility], please contact our office at [phone] between the hours of 8:00 a.m. and 4:30 p.m.  Voicemails left after 4:00 p.m. will not be returned until the following business day.  For prescription refill requests, have your pharmacy contact our office and allow 72 hours.    Cancer Center Support Programs:   > Cancer Support Group  2nd Tuesday of the month 1pm-2pm, Journey Room

## 2017-09-17 NOTE — Progress Notes (Signed)
Chemotherapy (daratumumab, Pomalyst, dex) education packet given and reviewed with pt.  All questions were answered.  Consent obtained.   Tolerated infusion w/o adverse reaction.  Alert, in no distress.  VSS.  Discharged ambulatory in c/o spouse.

## 2017-09-17 NOTE — Progress Notes (Unsigned)
[place], [place] [address]   CLINIC:  Medical Oncology/Hematology  PCP:  [name], MD [address] [place] [address] [phone]   REASON FOR VISIT:  Follow-up for multiple myeloma  CURRENT THERAPY: pomalidomide dexamethasone, and IV daratumumab  BRIEF ONCOLOGIC HISTORY:    Multiple myeloma without remission ([place])   11/16/2012 Initial Diagnosis    L occipital condyle destructive lesion, not biopsied secondary to location. XRT    12/01/2012 Imaging    L3 superior endplate compression FX, no lytic lesions to suggest myeloma    02/06/2014 Imaging    soft tissue mass along the right posterior fifth rib with some rib destruction    02/16/2014 Miscellaneous    Normal CBC, Normal CMP, kappa,lamda ratio of 2.62 (abnormal), UIEP with slightly restricted band, IgG kappa, SPEP/IEP with monoclonal protein at 0.79 g/dl, normal igG, suppressed IGM    02/20/2014 Bone Marrow Biopsy    33% kappa restricted plasma cells Normal FISH, normal cytogenetics    02/21/2014 - 03/08/2014 Radiation Therapy    30Gy to R rib lesion, lesion not biopsied    03/16/2014 PET scan    Scattered hypermetabolic osseous lesions, including the calvarium, ribs, left scapula, sacrum, and left femoral shaft. An expansile left vertebral body lesion at L3 extends into the epidural space of the left lateral recess,     03/17/2014 - 04/07/2014 Chemotherapy    Velcade and Dexamethasone due to initial denial of Revlimid by insurance company.  Zometa monthly.    03/22/2014 Imaging    MR_ L-Spine- Enhancing lesions compatible with multiple myeloma at L2, L3, and S1-2.    04/07/2014 - 07/28/2014 Chemotherapy    RVD with Revlimid at 25 mg days 1-14.  Revlimid dose reduced to 25 mg every other day x 14 days with 7 day respite beginning on day 1 of cycle 2.    04/17/2014 Adverse Reaction    Revlimid-induced rash.  Treated with steroids.    07/28/2014 - 08/04/2014  Chemotherapy    Revlimid daily    08/04/2014 Adverse Reaction    Velcade-induced peripheral neuropathy    08/04/2014 Treatment Plan Change    D/C Velcade    08/11/2014 PET scan    Response to therapy. Improvement and resolution of foci of osseous hypermetabolism.    08/22/2014 - 08/28/2014 Chemotherapy    Revlimid 25 mg days 1-21 every 28 days    08/28/2014 Adverse Reaction    Revlimid-induced rash. Medication held.  Medrol dose Pak prescribed.    09/04/2014 - 01/08/2015 Chemotherapy    Revlimid 10 mg every other day, without dexamethasone    01/08/2015 - 01/15/2015 Hospital Admission    Colonic diverticular abscess with IR drain placement    03/02/2015 PET scan    Only bony uptake is low activity at site of deformity/callus at R second rib FX, high activity in sigmoid colon, advanced sigmoid divertidulosis. Abscess not resolved?    06/04/2015 Imaging    CT nonobstructive L renal calculus, diverticulosis of descending and sigmoid colon without inflammation, moderate prostatic enlargement    07/12/2015 Surgery    Diverticulitis s/p robotic sigmoid colectomy with Dr. Johney Maine    08/23/2015 PET scan    Primarily similar hypermetabolic osseous foci within the R sided ribs. new L third rib focus of hypermetab and sclerosis is favored to be related to healing FX. no soft tissue myeloma id'd. Presumed postop hypermetab and edema about the R pelvic wall  03/06/2016 PET scan    1. Reduced activity in the prior lesion such as the right second and fifth rib lesions, activity nearly completely resolved and significantly less than added mediastinal blood pool activity. No new lesions are identified. 2. Other imaging findings of potential clinical significance: Coronary, aortic arch, and branch vessel atherosclerotic vascular disease. Aortoiliac atherosclerotic vascular disease. Enlarged prostate gland. Colonic diverticula.    02/17/2017 PET scan    HEAD/NECK: No hypermetabolic activity in the scalp. No  hypermetabolic cervical lymph nodes. CHEST: No hypermetabolic mediastinal or hilar nodes. Right upper lobe scarring/atelectasis. No suspicious pulmonary nodules on the CT scan. ABDOMEN/PELVIS: No abnormal hypermetabolic activity within the liver, pancreas, adrenal glands, or spleen. No hypermetabolic lymph nodes in the abdomen or pelvis. Atherosclerotic calcifications of the abdominal aorta and branch vessels. Left renal sinus cysts. Sigmoid diverticulosis, without evidence of diverticulitis. Prostatomegaly. SKELETON: Vague hypermetabolism involving the inferior sternum, max SUV 3.2, previously 8.2. Focal hypermetabolism involving the left sacrum, max SUV 4.0, previously 6.7. EXTREMITIES: No abnormal hypermetabolic activity in the lower extremities.    02/18/2017 -  Chemotherapy    Bortezomib 1.28m/m2 QWk + Dexamethasone 168mQTue/Fri --Cycle #1, 02/18/17     02/18/2017 - 08/26/2017 Chemotherapy    The patient had bortezomib SQ (VELCADE) chemo injection 3 mg, 1.3 mg/m2 = 3 mg, Subcutaneous,  Once, 7 of 9 cycles Administration: 3 mg (02/18/2017), 3 mg (02/26/2017), 3 mg (03/04/2017), 3 mg (03/11/2017), 3 mg (04/08/2017), 3 mg (03/18/2017), 3 mg (03/25/2017), 3 mg (04/01/2017), 3 mg (05/06/2017), 3 mg (05/20/2017), 3 mg (06/03/2017), 3 mg (06/17/2017), 3 mg (07/01/2017), 3 mg (07/15/2017), 3 mg (07/29/2017), 3 mg (08/12/2017), 3 mg (08/26/2017)  for chemotherapy treatment.     09/14/2017 -  Chemotherapy    The patient had daratumumab (DARZALEX) 1,820 mg in sodium chloride 0.9 % 909 mL (1.82 mg/mL) chemo infusion, 16 mg/kg, Intravenous, Once, 1 of 1 cycle daratumumab (DARZALEX) 1,820 mg in sodium chloride 0.9 % 409 mL (3.64 mg/mL) chemo infusion, 16 mg/kg, Intravenous, Once, 1 of 7 cycles  for chemotherapy treatment.       INTERVAL HISTORY:  Mr. He[place]am[place]au77.o. male returns for routine follow-up multiple myeloma and his first chemotherapy treatment. Patient is here with his wife. He is still having back and rib pain. He  has numbness in his feet and legs. His fatigue has worsened. Patient reports his appetite and energy level at 25%. He denies any nausea, vomiting, or diarrhea. Denies any bleeding.     REVIEW OF SYSTEMS:  Review of Systems  Constitutional: Positive for chills and fatigue.       Sweats  HENT:  Negative.   Eyes: Negative.   Respiratory: Negative.   Cardiovascular: Negative.   Gastrointestinal: Negative.   Endocrine: Negative.   Genitourinary: Negative.    Skin: Negative.   Neurological: Positive for extremity weakness and numbness.  Hematological: Bruises/bleeds easily.  Psychiatric/Behavioral: Negative.      PAST MEDICAL/SURGICAL HISTORY:  Past Medical History:  Diagnosis Date  . BPH (benign prostatic hyperplasia)   . Colonic diverticular abscess 01/08/2015  . Diverticulitis 1197/41/63 complicated by abscess and required percutaneous drainage  . GERD (gastroesophageal reflux disease)   . H/O ETOH abuse   . History of chemotherapy last done jan 2017  . History of radiation therapy 01/05/13-02/10/13   45 gray to left occipital condyle region  . History of radiation therapy 12/01/16-12/10/16   Parasternal nodule, chest- 24 Gy total delivered in 8 fractions, Left sacro-iliac, pelvis-  24 Gy total delivered in 8 fractions   . History of radiation therapy 02/19/17-02/22/17   right temporal scalp 30 Gy in 10 fractions  . Intra-abdominal abscess ([facility])   . Multiple myeloma ([facility]) 2015  . Neuropathy   . Radiation 02/21/14-03/08/14   right posterior chest wall area 30 gray  . Radiation 04/19/14-05/02/14   lumbar spine 25 gray  . Skull lesion    Left occipital condyle  . Wrist fracture, left    x 2   Past Surgical History:  Procedure Laterality Date  . COLONOSCOPY N/A 04/19/2015   Procedure: COLONOSCOPY;  Surgeon: Danie Binder, MD;  Location: [facility];  Service: Endoscopy;  Laterality: N/A;  1:30 PM  . CYST EXCISION  1959   tail bone     SOCIAL HISTORY:  Social History    Socioeconomic History  . Marital status: Married    Spouse name: Not on file  . Number of children: 3  . Years of education: Not on file  . Highest education level: Not on file  Occupational History    Employer: RETIRED  Social Needs  . Financial resource strain: Not on file  . Food insecurity:    Worry: Not on file    Inability: Not on file  . Transportation needs:    Medical: Not on file    Non-medical: Not on file  Tobacco Use  . Smoking status: Never Smoker  . Smokeless tobacco: Never Used  Substance and Sexual Activity  . Alcohol use: No    Comment: " Not much no more"  . Drug use: No  . Sexual activity: Not on file  Lifestyle  . Physical activity:    Days per week: Not on file    Minutes per session: Not on file  . Stress: Not on file  Relationships  . Social connections:    Talks on phone: Not on file    Gets together: Not on file    Attends religious service: Not on file    Active member of club or organization: Not on file    Attends meetings of clubs or organizations: Not on file    Relationship status: Not on file  . Intimate partner violence:    Fear of current or ex partner: Not on file    Emotionally abused: Not on file    Physically abused: Not on file    Forced sexual activity: Not on file  Other Topics Concern  . Not on file  Social History Narrative  . Not on file    FAMILY HISTORY:  Family History  Problem Relation Age of Onset  . Diabetes Mother   . Stroke Mother   . Hypertension Father     CURRENT MEDICATIONS:  Outpatient Encounter Medications as of 09/17/2017  Medication Sig  . acyclovir (ZOVIRAX) 400 MG tablet Take 1 tablet (400 mg total) by mouth 2 (two) times daily.  Marland Kitchen ALPRAZolam (XANAX) 1 MG tablet TAKE 2 TABLETS BY MOUTH AT BEDTIME AS NEEDED FOR ANXIETY AND OR SLEEP  . aspirin EC 325 MG tablet Take 1 tablet (325 mg total) by mouth daily.  Marland Kitchen CALCIUM-VITAMIN D PO Take by mouth. 1500/1000 PO QD  . DARATUMUMAB IV Inject into the  vein.  Marland Kitchen dexamethasone (DECADRON) 4 MG tablet Take 2.5 tablets (10 mg total) by mouth once a week. Take 10 mg once weekly  . furosemide (LASIX) 20 MG tablet Take 1 tablet (20 mg total) by mouth daily as needed for edema.  Marland Kitchen  gabapentin (NEURONTIN) 400 MG capsule TAKE 5 CAPSULES BY MOUTH DAILY  . HYDROcodone-acetaminophen (NORCO/VICODIN) 5-325 MG tablet TAKE 1 TABLET BY MOUTH EVERY 6 HOURS AS NEEDED FOR MODERATE PAIN  . ibuprofen (ADVIL,MOTRIN) 800 MG tablet TAKE 1 TABLET BY MOUTH EVERY 8 HOURS AS NEEDED FOR MODERATE PAIN.  Marland Kitchen lidocaine-prilocaine (EMLA) cream Apply small amount over port one (1) hour prior to appointment.  Marland Kitchen omeprazole (PRILOSEC) 20 MG capsule Take 1 capsule (20 mg total) by mouth daily.  Marland Kitchen oxyCODONE 10 MG TABS Take 1 tablet (10 mg total) by mouth every 8 (eight) hours as needed for severe pain.  . pomalidomide (POMALYST) 2 MG capsule Take 1 capsule (2 mg total) by mouth daily. Take with water on days 1-21. Repeat every 28 days.  . tamsulosin (FLOMAX) 0.4 MG CAPS capsule TAKE ONE CAPSULE BY MOUTH AT BEDTIME  . zolendronic acid (ZOMETA) 4 MG/5ML injection Inject 4 mg into the vein. QOMonth   Facility-Administered Encounter Medications as of 09/17/2017  Medication  . acetaminophen (TYLENOL) tablet 650 mg  . dexamethasone (DECADRON) 20 mg in sodium chloride 0.9 % 50 mL IVPB  . diphenhydrAMINE (BENADRYL) capsule 50 mg  . montelukast (SINGULAIR) tablet 10 mg  . prochlorperazine (COMPAZINE) tablet 10 mg    ALLERGIES:  Allergies  Allergen Reactions  . Codeine Other (See Comments)    Severe headache  . Morphine And Related Other (See Comments)    Makes him feel weird  . Revlimid [Lenalidomide] Other (See Comments)    "Causes me to become weak"     PHYSICAL EXAM:  ECOG Performance status: 1  Vitals:   09/17/17 0832  BP: (!) 142/69  Pulse: 75  Resp: 18  Temp: 97.8 F (36.6 C)  SpO2: 98%   Filed Weights   09/17/17 0832  Weight: 244 lb 9.6 oz (110.9 kg)     Physical Exam  Constitutional: He is oriented to person, place, and time. He appears well-developed and well-nourished.  Cardiovascular: Normal rate, regular rhythm and normal heart sounds.  Pulmonary/Chest: Effort normal and breath sounds normal.  Neurological: He is alert and oriented to person, place, and time.  Skin: Skin is warm and dry.     LABORATORY DATA:  I have reviewed the labs as listed.  CBC    Component Value Date/Time   WBC 5.1 09/17/2017 0758   RBC 4.59 09/17/2017 0758   HGB 14.9 09/17/2017 0758   HCT 43.4 09/17/2017 0758   PLT 126 (L) 09/17/2017 0758   MCV 94.6 09/17/2017 0758   MCH 32.5 09/17/2017 0758   MCHC 34.3 09/17/2017 0758   RDW 13.3 09/17/2017 0758   LYMPHSABS 1.0 09/17/2017 0758   MONOABS 0.7 09/17/2017 0758   EOSABS 0.0 09/17/2017 0758   BASOSABS 0.0 09/17/2017 0758   CMP Latest Ref Rng & Units 09/17/2017 09/09/2017 08/26/2017  Glucose 70 - 99 mg/dL 110(H) 139(H) 111(H)  BUN 8 - 23 mg/dL 18 18 22   Creatinine 0.61 - 1.24 mg/dL 0.83 0.87 0.79  Sodium 135 - 145 mmol/L 138 138 139  Potassium 3.5 - 5.1 mmol/L 4.3 3.9 4.4  Chloride 98 - 111 mmol/L 104 107 105  CO2 22 - 32 mmol/L 25 22 26   Calcium 8.9 - 10.3 mg/dL 8.9 8.7(L) 9.2  Total Protein 6.5 - 8.1 g/dL 6.5 6.1(L) 6.8  Total Bilirubin 0.3 - 1.2 mg/dL 1.2 0.6 0.6  Alkaline Phos 38 - 126 U/L 25(L) 25(L) 25(L)  AST 15 - 41 U/L 18 25 17   ALT 0 -  44 U/L 14 15 15        DIAGNOSTIC IMAGING:  *The following radiologic images and reports have been reviewed independently and agree with below findings.      ASSESSMENT & PLAN:   No problem-specific Assessment & Plan notes found for this encounter.      Orders placed this encounter:  No orders of the defined types were placed in this encounter.     Derek Jack, MD [place] [phone]

## 2017-09-18 ENCOUNTER — Telehealth (HOSPITAL_COMMUNITY): Payer: Self-pay | Admitting: *Deleted

## 2017-09-18 ENCOUNTER — Other Ambulatory Visit: Payer: Self-pay | Admitting: Family Medicine

## 2017-09-18 ENCOUNTER — Other Ambulatory Visit (HOSPITAL_COMMUNITY): Payer: Self-pay | Admitting: *Deleted

## 2017-09-18 DIAGNOSIS — G4700 Insomnia, unspecified: Secondary | ICD-10-CM

## 2017-09-18 DIAGNOSIS — N400 Enlarged prostate without lower urinary tract symptoms: Secondary | ICD-10-CM

## 2017-09-18 DIAGNOSIS — C9000 Multiple myeloma not having achieved remission: Secondary | ICD-10-CM

## 2017-09-18 MED ORDER — TAMSULOSIN HCL 0.4 MG PO CAPS: 0.4000 mg | ORAL_CAPSULE | Freq: Every day | ORAL | 3 refills | Status: DC

## 2017-09-18 NOTE — Telephone Encounter (Signed)
Ok to refill??  Last office visit 06/30/2017.  Last refill 06/26/2017.

## 2017-09-18 NOTE — Telephone Encounter (Signed)
Spoke with pt for 24 hour f/u - reports he is feeling well and was able to get around and do chores today. Denies any s/s.  Instructed to call the clinic with any questions or concerns.

## 2017-09-23 ENCOUNTER — Ambulatory Visit
Admission: RE | Admit: 2017-09-23 | Discharge: 2017-09-23 | Disposition: A | Discharge status: Home / Self Care | Payer: Medicare Other | Source: Ambulatory Visit | Attending: Radiation Oncology | Admitting: Radiation Oncology

## 2017-09-23 ENCOUNTER — Other Ambulatory Visit: Payer: Self-pay

## 2017-09-23 ENCOUNTER — Telehealth (HOSPITAL_COMMUNITY): Payer: Self-pay | Admitting: *Deleted

## 2017-09-23 ENCOUNTER — Encounter: Payer: Self-pay | Admitting: Radiation Oncology

## 2017-09-23 VITALS — BP 135/49 | HR 70 | Temp 97.8°F | Resp 20 | Ht 68.0 in | Wt 247.6 lb

## 2017-09-23 DIAGNOSIS — C9000 Multiple myeloma not having achieved remission: Secondary | ICD-10-CM

## 2017-09-23 DIAGNOSIS — Z79899 Other long term (current) drug therapy: Secondary | ICD-10-CM | POA: Insufficient documentation

## 2017-09-23 DIAGNOSIS — Z7982 Long term (current) use of aspirin: Secondary | ICD-10-CM | POA: Insufficient documentation

## 2017-09-23 DIAGNOSIS — Z885 Allergy status to narcotic agent status: Secondary | ICD-10-CM | POA: Diagnosis not present

## 2017-09-23 DIAGNOSIS — R222 Localized swelling, mass and lump, trunk: Secondary | ICD-10-CM | POA: Diagnosis not present

## 2017-09-23 DIAGNOSIS — R21 Rash and other nonspecific skin eruption: Secondary | ICD-10-CM | POA: Diagnosis not present

## 2017-09-23 NOTE — Telephone Encounter (Signed)
Pharmacy called and medication called in for rash. Prednisone 60mg  for 5 days per Dr. Raliegh Ip. I was unable to leave a message for him.

## 2017-09-23 NOTE — Progress Notes (Signed)
Radiation Oncology         [phone]) [phone] ________________________________  Name: Dean [name]. MRN: [id number]  Date: 09/23/2017  DOB: 1935/02/19  Re-Evaluation Visit Note  CC: [name], MD  [name], MD    ICD-10-CM   1. Multiple myeloma without remission [facility]) C90.00 Ambulatory referral to Social Work    Diagnosis:   Recurrent multiple myeloma     Interval Since Last Radiation:  7 months, 1 day    Radiation treatment dates:  1) 02/19/17-02/22/17     2) 12/01/16-12/10/16      3) 04/19/2014-05/02/2014      4) 02/21/2014-03/08/2014  Site/dose:  1) Right temporal Scalp/ 30 Gy in 10 fractions          2) 1. Parasternal nodule, chest- 24 Gy total delivered in 8 fractions    2. Left sacro-iliac, pelvis- 24 Gy total delivered in 8 fractions           3) Lumbar spine and 25 gray in 10 fractions          4) Right posterior chest wall area, 30 gray in 10 fractions  Narrative:  The patient returns today for reevaluation of a new problem. His is accompanied by his wife and daughter. He has been having pain in his mid back area prompting imaging studies as below  Since they were last seen in the office, he had MR Thoracic and Lumbar spine completed on 09/03/2017 with results showing: Expansile lesion left posterior rib compatible with myeloma, not seen on prior PET. This extends into the extrapleural space. Left sacral lesion 12 mm noted on prior PET. Chronic fracture L4 vertebral body. Acute or subacute mild fracture S1 vertebral body likely benign Advanced multilevel lumbar degenerative change as above. Of note, there is a large extruded disc fragment on the left at L3-4 causing severe spinal stenosis with severe subarticular foraminal stenosis on the left.  He is currently being treated by Medical Oncologist, Dr. Delton Coombes at [facility]. The patient most recent chemotherapy has consisted of daratumumab (DARZALEX) 1,820 mg in sodium chloride 0.9 % 909 mL (1.82  mg/mL) chemo infusion, 16 mg/kg, Intravenous, Once, 1 of 1 cycle daratumumab (DARZALEX) 1,820 mg in sodium chloride 0.9 % 409 mL (3.64 mg/mL) chemo infusion, 16 mg/kg, Intravenous, Once, 1 of 7 cycles for chemotherapy treatment.    On review of systems, he reports mid-lower back pain, leg pain, diffuse pruritic rash with a burning sensation to body x 3 days ago, and leg cramps treated with quinine water. He received his new chemotherapy treatment on 09/17/2017 with the rash resulting.  he denies SOB and any other symptoms. Pertinent positives are listed and detailed within the above HPI.                  ALLERGIES:  is allergic to codeine; morphine and related; and revlimid [lenalidomide].  Meds: Current Outpatient Medications  Medication Sig Dispense Refill  . acyclovir (ZOVIRAX) 400 MG tablet Take 1 tablet (400 mg total) by mouth 2 (two) times daily. 60 tablet 3  . ALPRAZolam (XANAX) 1 MG tablet TAKE 2 TABLETS BY MOUTH AT BEDTIME AS NEEDED FOR ANXIETY AND OR SLEEP. (Patient taking differently: Take 0.5-2 mg by mouth at bedtime as needed for anxiety or sleep. ) 60 tablet 1  . aspirin EC 325 MG tablet Take 1 tablet (325 mg total) by mouth daily. 30 tablet 11  . CALCIUM-VITAMIN D PO Take 1 tablet by mouth  2 (two) times daily.     Marland Kitchen dexamethasone (DECADRON) 4 MG tablet Take 2.5 tablets (10 mg total) by mouth once a week. Take 10 mg once weekly 10 tablet 12  . furosemide (LASIX) 20 MG tablet Take 1 tablet (20 mg total) by mouth daily as needed for edema. 30 tablet 0  . gabapentin (NEURONTIN) 400 MG capsule TAKE 5 CAPSULES BY MOUTH DAILY (Patient taking differently: Take 400 mg by mouth 2 (two) times daily. ) 150 capsule 3  . HYDROcodone-acetaminophen (NORCO/VICODIN) 5-325 MG tablet TAKE 1 TABLET BY MOUTH EVERY 6 HOURS AS NEEDED FOR MODERATE PAIN (Patient taking differently: Take 1-2 tablets by mouth every 6 (six) hours as needed for severe pain. ) 30 tablet 0  . lidocaine-prilocaine (EMLA) cream Apply  small amount over port one (1) hour prior to appointment. 30 g 0  . MAGNESIUM PO Take 1 tablet by mouth at bedtime.    Marland Kitchen omeprazole (PRILOSEC) 20 MG capsule Take 1 capsule (20 mg total) by mouth daily. (Patient taking differently: Take 20-40 mg by mouth daily as needed (heartburn). ) 30 capsule 5  . oxyCODONE 10 MG TABS Take 1 tablet (10 mg total) by mouth every 8 (eight) hours as needed for severe pain. 50 tablet 0  . pomalidomide (POMALYST) 2 MG capsule Take 1 capsule (2 mg total) by mouth daily. Take with water on days 1-21. Repeat every 28 days. 21 capsule 0  . tamsulosin (FLOMAX) 0.4 MG CAPS capsule Take 1 capsule (0.4 mg total) by mouth at bedtime. 30 capsule 3  . zolendronic acid (ZOMETA) 4 MG/5ML injection Inject 4 mg into the vein every 30 (thirty) days.     Marland Kitchen ibuprofen (ADVIL,MOTRIN) 800 MG tablet TAKE 1 TABLET BY MOUTH EVERY 8 HOURS AS NEEDED FOR MODERATE PAIN. (Patient not taking: No sig reported) 90 tablet 2   No current facility-administered medications for this encounter.     Physical Findings: The patient is in no acute distress. Patient is alert and oriented.  height is 5' 8"  (1.727 m) and weight is 247 lb 9.6 oz (112.3 kg). His oral temperature is 97.8 F (36.6 C). His blood pressure is 135/49 (abnormal) and his pulse is 70. His respiration is 20 and oxygen saturation is 99%.   Lungs are clear to auscultation bilaterally. Heart has regular rate and rhythm. No palpable cervical, supraclavicular, or axillary adenopathy. Abdomen soft, non-tender, normal bowel sounds. Neuro exam. Lower motor strength is 5.5 in the proximal and distal motor groups Skin: Please see images for skin rash.          Lab Findings: Lab Results  Component Value Date   WBC 5.1 09/17/2017   HGB 14.9 09/17/2017   HCT 43.4 09/17/2017   MCV 94.6 09/17/2017   PLT 126 (L) 09/17/2017    Radiographic Findings: Mr Thoracic Spine W Wo Contrast  Result Date: 09/03/2017 CLINICAL DATA:  Multiple myeloma  EXAM: MRI THORACIC AND LUMBAR SPINE WITHOUT AND WITH CONTRAST TECHNIQUE: Multiplanar and multiecho pulse sequences of the thoracic and lumbar spine were obtained without and with intravenous contrast. CONTRAST:  6m MULTIHANCE GADOBENATE DIMEGLUMINE 529 MG/ML IV SOLN COMPARISON:  Lumbar MRI 10/24/2016, PET 02/17/2017 FINDINGS: MRI THORACIC SPINE FINDINGS Alignment:  Normal Vertebrae: Hemangioma T12 vertebral body. No fracture or vertebral body lesion. Schmorl's node inferior endplate of T8. Cord:  Negative for cord compression.  Normal cord signal. Paraspinal and other soft tissues: Expansile posterior rib lesion on the left, not present on the prior PET scan.  Possible eighth rib. Lesion consistent with myeloma. See extends into the extrapleural space. Small left pleural effusion. Minimal right pleural effusion. 1 cm subcutaneous cyst in the posterior back midline at the T4-5 level appears benign. Disc levels: Small central disc protrusions at T3-4 and T4-5. Small central disc protrusion at T6-7. No significant spinal stenosis. Schmorl's node inferior endplate of T8. MRI LUMBAR SPINE FINDINGS Segmentation:  Normal Alignment:  Mild retrolisthesis L1-2 and L2-3 Vertebrae: Mild fracture inferior endplate of S1 bone marrow edema enhancement. This likely a recent fracture. Chronic fracture of L4. Hemangioma T12. 12 mm enhancing lesion and a left sacrum consistent with multiple myeloma. This is identified on the prior PET scan. Conus medullaris: Extends to the L1 level and appears normal. Paraspinal and other soft tissues: Negative for paraspinous mass or edema. Disc levels: L1-2: Advanced disc on the right with extensive degeneration asymmetric spurring on the right causing moderate right foraminal encroachment. No spinal stenosis. L2-3: Moderate disc degeneration. Diffuse disc bulging and endplate spurring right greater than left. Extraforaminal disc and osteophyte complex. Moderate subarticular stenosis bilaterally.  L3-4: Large extruded disc fragment on the left with downgoing disc material. This extends across the midline to the right. There is moderate to severe facet degeneration. Severe spinal stenosis. Severe subarticular and foraminal stenosis on the left. Moderate right foraminal stenosis. L4-5: Moderate facet degeneration bilaterally causing moderate subarticular stenosis bilaterally. Diffuse bulging of the disc. L5-S1: Mild disc and facet degeneration without stenosis. IMPRESSION: 1. Expansile lesion left posterior rib compatible with myeloma, not seen on prior PET. This extends into the extrapleural space. 2. Left sacral lesion 12 mm  noted on prior PET. 3. Chronic fracture L4 vertebral body. Acute or subacute mild fracture S1 vertebral body likely benign 4. Advanced multilevel lumbar degenerative change as above. Of note, there is a large extruded disc fragment on the left at L3-4 causing severe spinal stenosis with severe subarticular foraminal stenosis on the left. Electronically Signed   By: Franchot Gallo M.D.   On: 09/03/2017 13:46   Mr Lumbar Spine W Wo Contrast  Result Date: 09/03/2017 CLINICAL DATA:  Multiple myeloma EXAM: MRI THORACIC AND LUMBAR SPINE WITHOUT AND WITH CONTRAST TECHNIQUE: Multiplanar and multiecho pulse sequences of the thoracic and lumbar spine were obtained without and with intravenous contrast. CONTRAST:  88m MULTIHANCE GADOBENATE DIMEGLUMINE 529 MG/ML IV SOLN COMPARISON:  Lumbar MRI 10/24/2016, PET 02/17/2017 FINDINGS: MRI THORACIC SPINE FINDINGS Alignment:  Normal Vertebrae: Hemangioma T12 vertebral body. No fracture or vertebral body lesion. Schmorl's node inferior endplate of T8. Cord:  Negative for cord compression.  Normal cord signal. Paraspinal and other soft tissues: Expansile posterior rib lesion on the left, not present on the prior PET scan. Possible eighth rib. Lesion consistent with myeloma. See extends into the extrapleural space. Small left pleural effusion. Minimal  right pleural effusion. 1 cm subcutaneous cyst in the posterior back midline at the T4-5 level appears benign. Disc levels: Small central disc protrusions at T3-4 and T4-5. Small central disc protrusion at T6-7. No significant spinal stenosis. Schmorl's node inferior endplate of T8. MRI LUMBAR SPINE FINDINGS Segmentation:  Normal Alignment:  Mild retrolisthesis L1-2 and L2-3 Vertebrae: Mild fracture inferior endplate of S1 bone marrow edema enhancement. This likely a recent fracture. Chronic fracture of L4. Hemangioma T12. 12 mm enhancing lesion and a left sacrum consistent with multiple myeloma. This is identified on the prior PET scan. Conus medullaris: Extends to the L1 level and appears normal. Paraspinal and other soft tissues: Negative  for paraspinous mass or edema. Disc levels: L1-2: Advanced disc on the right with extensive degeneration asymmetric spurring on the right causing moderate right foraminal encroachment. No spinal stenosis. L2-3: Moderate disc degeneration. Diffuse disc bulging and endplate spurring right greater than left. Extraforaminal disc and osteophyte complex. Moderate subarticular stenosis bilaterally. L3-4: Large extruded disc fragment on the left with downgoing disc material. This extends across the midline to the right. There is moderate to severe facet degeneration. Severe spinal stenosis. Severe subarticular and foraminal stenosis on the left. Moderate right foraminal stenosis. L4-5: Moderate facet degeneration bilaterally causing moderate subarticular stenosis bilaterally. Diffuse bulging of the disc. L5-S1: Mild disc and facet degeneration without stenosis. IMPRESSION: 1. Expansile lesion left posterior rib compatible with myeloma, not seen on prior PET. This extends into the extrapleural space. 2. Left sacral lesion 12 mm  noted on prior PET. 3. Chronic fracture L4 vertebral body. Acute or subacute mild fracture S1 vertebral body likely benign 4. Advanced multilevel lumbar  degenerative change as above. Of note, there is a large extruded disc fragment on the left at L3-4 causing severe spinal stenosis with severe subarticular foraminal stenosis on the left. Electronically Signed   By: Franchot Gallo M.D.   On: 09/03/2017 13:46    Impression:   Multiple myeloma. The patient has developed significant mass associated with what appears to be his approximately left 8th rib. Pt is quite symptomatic from this mass and would be a good candidate for palliative radiation therapy directed to this area.   Today, I talked to the patient and family about the findings and work-up thus far.  We discussed the natural history of multiple myeloma and general treatment, highlighting the role of radiotherapy in the management.  We discussed the available radiation techniques, and focused on the details of logistics and delivery.  We reviewed the anticipated acute and late sequelae associated with radiation in this setting.  The patient was encouraged to ask questions that I answered to the best of my ability.  A patient consent form was discussed and signed.  We retained a copy for our records.  The patient would like to proceed with radiation and will be scheduled for CT simulation.   Plan:  Simulation appointment on tomorrow, 09/24/2017 at 1 PM.  I have spoken with Dr. Delton Coombes concerning the patient's skin rash and he feels this rash is related to pomalidomide. I have discussed with the patient and family that he should stop taking this medication. The patient will be placed on steroids for his skin rash. He will also be seen by medical oncology tomorrow 9:30 concerning the patient's skin rash.  ____________________________________   Blair Promise, PhD, MD    This document serves as a record of services personally performed by Gery Pray, MD. It was created on his behalf by [facility], a trained medical scribe. The creation of this record is based on the scribe's personal  observations and the provider's statements to them. This document has been checked and approved by the attending provider.

## 2017-09-24 ENCOUNTER — Ambulatory Visit
Admission: RE | Admit: 2017-09-24 | Discharge: 2017-09-24 | Disposition: A | Discharge status: Home / Self Care | Payer: Medicare Other | Source: Ambulatory Visit | Attending: Radiation Oncology | Admitting: Radiation Oncology

## 2017-09-24 ENCOUNTER — Encounter (HOSPITAL_COMMUNITY): Payer: Self-pay | Admitting: Hematology

## 2017-09-24 ENCOUNTER — Encounter (HOSPITAL_COMMUNITY): Payer: Self-pay

## 2017-09-24 ENCOUNTER — Inpatient Hospital Stay (HOSPITAL_COMMUNITY): Payer: Medicare Other

## 2017-09-24 ENCOUNTER — Inpatient Hospital Stay (HOSPITAL_BASED_OUTPATIENT_CLINIC_OR_DEPARTMENT_OTHER): Payer: Medicare Other | Admitting: Hematology

## 2017-09-24 ENCOUNTER — Other Ambulatory Visit: Payer: Self-pay | Admitting: Radiology

## 2017-09-24 VITALS — BP 131/59 | HR 71 | Temp 97.8°F | Resp 18 | Wt 248.4 lb

## 2017-09-24 DIAGNOSIS — G620 Drug-induced polyneuropathy: Secondary | ICD-10-CM | POA: Diagnosis not present

## 2017-09-24 DIAGNOSIS — Z510 Encounter for antineoplastic radiation therapy: Secondary | ICD-10-CM | POA: Insufficient documentation

## 2017-09-24 DIAGNOSIS — R5383 Other fatigue: Secondary | ICD-10-CM

## 2017-09-24 DIAGNOSIS — C9000 Multiple myeloma not having achieved remission: Secondary | ICD-10-CM

## 2017-09-24 DIAGNOSIS — R21 Rash and other nonspecific skin eruption: Secondary | ICD-10-CM | POA: Diagnosis not present

## 2017-09-24 DIAGNOSIS — M549 Dorsalgia, unspecified: Secondary | ICD-10-CM | POA: Diagnosis not present

## 2017-09-24 DIAGNOSIS — Z5112 Encounter for antineoplastic immunotherapy: Secondary | ICD-10-CM | POA: Diagnosis not present

## 2017-09-24 DIAGNOSIS — C7951 Secondary malignant neoplasm of bone: Secondary | ICD-10-CM | POA: Diagnosis not present

## 2017-09-24 DIAGNOSIS — T451X5A Adverse effect of antineoplastic and immunosuppressive drugs, initial encounter: Secondary | ICD-10-CM

## 2017-09-24 DIAGNOSIS — M899 Disorder of bone, unspecified: Secondary | ICD-10-CM | POA: Diagnosis not present

## 2017-09-24 DIAGNOSIS — M5136 Other intervertebral disc degeneration, lumbar region: Secondary | ICD-10-CM

## 2017-09-24 LAB — CBC WITH DIFFERENTIAL/PLATELET
Basophils Absolute: 0 10*3/uL (ref 0.0–0.1)
Basophils Relative: 0 %
Eosinophils Absolute: 0 10*3/uL (ref 0.0–0.7)
Eosinophils Relative: 0 %
HCT: 39.8 % (ref 39.0–52.0)
Hemoglobin: 13.1 g/dL (ref 13.0–17.0)
Lymphocytes Relative: 3 %
Lymphs Abs: 0.2 10*3/uL — ABNORMAL LOW (ref 0.7–4.0)
MCH: 31.3 pg (ref 26.0–34.0)
MCHC: 32.9 g/dL (ref 30.0–36.0)
MCV: 95 fL (ref 78.0–100.0)
Monocytes Absolute: 0.1 10*3/uL (ref 0.1–1.0)
Monocytes Relative: 2 %
Neutro Abs: 6.5 10*3/uL (ref 1.7–7.7)
Neutrophils Relative %: 95 %
Platelets: 82 10*3/uL — ABNORMAL LOW (ref 150–400)
RBC: 4.19 MIL/uL — ABNORMAL LOW (ref 4.22–5.81)
RDW: 13.7 % (ref 11.5–15.5)
WBC: 6.9 10*3/uL (ref 4.0–10.5)

## 2017-09-24 LAB — COMPREHENSIVE METABOLIC PANEL
ALT: 15 U/L (ref 0–44)
AST: 16 U/L (ref 15–41)
Albumin: 3.1 g/dL — ABNORMAL LOW (ref 3.5–5.0)
Alkaline Phosphatase: 24 U/L — ABNORMAL LOW (ref 38–126)
Anion gap: 8 (ref 5–15)
BUN: 18 mg/dL (ref 8–23)
CO2: 23 mmol/L (ref 22–32)
Calcium: 8.4 mg/dL — ABNORMAL LOW (ref 8.9–10.3)
Chloride: 110 mmol/L (ref 98–111)
Creatinine, Ser: 0.72 mg/dL (ref 0.61–1.24)
GFR calc Af Amer: >60 mL/min (ref >60)
GFR calc non Af Amer: >60 mL/min (ref >60)
Glucose, Bld: 143 mg/dL — ABNORMAL HIGH (ref 70–99)
Potassium: 4.2 mmol/L (ref 3.5–5.1)
Sodium: 141 mmol/L (ref 135–145)
Total Bilirubin: 0.7 mg/dL (ref 0.3–1.2)
Total Protein: 5.5 g/dL — ABNORMAL LOW (ref 6.5–8.1)

## 2017-09-24 LAB — LACTATE DEHYDROGENASE: LDH: 117 U/L (ref 98–192)

## 2017-09-24 NOTE — Progress Notes (Addendum)
  Radiation Oncology         [id number]) [phone] ________________________________  Name: Dean [name]. MRN: [id number]  Date: 09/24/2017  DOB: June 01, 1935  SIMULATION AND TREATMENT PLANNING NOTE    ICD-10-CM   1. Multiple myeloma without remission ([place]) C90.00     DIAGNOSIS:  Recurrent Multiple Myeloma  NARRATIVE:  The patient was brought to the [facility].  Identity was confirmed.  All relevant records and images related to the planned course of therapy were reviewed.  The patient freely provided informed written consent to proceed with treatment after reviewing the details related to the planned course of therapy. The consent form was witnessed and verified by the simulation staff.  Then, the patient was set-up in a stable reproducible  supine position for radiation therapy.  CT images were obtained.  Surface markings were placed.  The CT images were loaded into the planning software.  Then the target and avoidance structures were contoured.  Treatment planning then occurred.  The radiation prescription was entered and confirmed.  Then, I designed and supervised the construction of a total of 4 medically necessary complex treatment devices.  I have requested : Complex Isodose Plan.  I have ordered: dose calculation.  PLAN:  The patient will receive 30 Gy in 10 fractions directed at the rib-based left posterior chest wall mass.  -----------------------------------  Blair Promise, PhD, MD  This document serves as a record of services personally performed by Gery Pray, MD. It was created on his behalf by Wilburn Mylar, a trained medical scribe. The creation of this record is based on the scribe's personal observations and the provider's statements to them. This document has been checked and approved by the attending provider.

## 2017-09-24 NOTE — Assessment & Plan Note (Signed)
1.  IgG kappa multiple myeloma: - Initially treated with RVD from 04/07/2014 through 07/28/2014, placed on maintenance Revlimid, which was discontinued 1 and half year ago, unknown reasons, patient thinks it was discontinued when he was receiving radiation. -MRI in January of the brain showed new right temporal lesion, status post radiation therapy, PET/CT scan in January 2019 showed improvement of multiple myeloma with no new lesions, started on weekly Velcade and dexamethasone on 02/18/2017, status post 2 cycles, Velcade changed to every other week on 05/06/2017 secondary to neuropathy, takes dexamethasone 20 mg on days of Velcade. - We reviewed the results of the M spike which was stable at 0.3.  Free light chain ratio went up to 4. -Because of persistent back pain, I have done an MRI of the thoracic and lumbar spine.  We discussed the results of the scans today.  He had a new rib lesion close to the spine on the left side, possibly eighth rib.  There is a soft tissue mass in that area.  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  There is also chronic L4 compression fracture which is stable. - Because of the new developments, I will discontinue Velcade.  I have recommended a much stronger regimen containing daratumumab, pomalidomide and dexamethasone.  Daratumumab will be given weekly for the first 8 weeks.  Pomalidomide will be started at a reduced dose of 2 mg, 21 days on, 7 days off.  Dexamethasone will be taken at a flat dose of 10 mg on a weekly basis.  We talked about the side effects of this regimen in detail. -He was started on first cycle of daratumumab, pomalidomide and dexamethasone on 09/17/2017.  He tolerated daratumumab very well.  However he got erythematous maculopapular rash involving the trunk for the last 2 days.  We have started him on prednisone 60 mg daily for 5 days.  The rash is slightly better today.  We have discontinued pomalidomide at this time.  This is most  likely rash related to pomalidomide.  He is having a port placed tomorrow.  We will hold off on further treatments until he finishes radiation therapy to the rib lesion. -We will tentatively start radiation therapy on 09/29/2017 for approximately 10 sessions.  He will initiate daratumumab after the radiation is completed.  2.  Peripheral neuropathy: He has numbness and tingling in his feet and legs, extending up to his knees.  This has been stable since last visit when we cut back on Velcade.  He is taking Neurontin twice daily.  If he takes more, he gets lightheadedness and forgetfulness.  3.  Bone protection: -He is receiving Zometa every 8 weeks.  His last Zometa was on 08/12/2017.    4.  Back pain: -This is from underlying degenerative disc disease as well as multiple myeloma. -He is currently taking oxycodone 10 mg every 8 hours as needed.  Hopefully the radiation will help control his pain.

## 2017-09-24 NOTE — Progress Notes (Signed)
[place], [place] [address]   CLINIC:  Medical Oncology/Hematology  PCP:  Dean Frizzle, MD [address] [place] [address] [phone]   REASON FOR VISIT:  Follow-up for multiple myeloma  CURRENT THERAPY: Pomalidomide, dexamethasone, and IV daratumumab   BRIEF ONCOLOGIC HISTORY:    Multiple myeloma without remission ([place])   11/16/2012 Initial Diagnosis    L occipital condyle destructive lesion, not biopsied secondary to location. XRT    12/01/2012 Imaging    L3 superior endplate compression FX, no lytic lesions to suggest myeloma    02/06/2014 Imaging    soft tissue mass along the right posterior fifth rib with some rib destruction    02/16/2014 Miscellaneous    Normal CBC, Normal CMP, kappa,lamda ratio of 2.62 (abnormal), UIEP with slightly restricted band, IgG kappa, SPEP/IEP with monoclonal protein at 0.79 g/dl, normal igG, suppressed IGM    02/20/2014 Bone Marrow Biopsy    33% kappa restricted plasma cells Normal FISH, normal cytogenetics    02/21/2014 - 03/08/2014 Radiation Therapy    30Gy to R rib lesion, lesion not biopsied    03/16/2014 PET scan    Scattered hypermetabolic osseous lesions, including the calvarium, ribs, left scapula, sacrum, and left femoral shaft. An expansile left vertebral body lesion at L3 extends into the epidural space of the left lateral recess,     03/17/2014 - 04/07/2014 Chemotherapy    Velcade and Dexamethasone due to initial denial of Revlimid by insurance company.  Zometa monthly.    03/22/2014 Imaging    MR_ L-Spine- Enhancing lesions compatible with multiple myeloma at L2, L3, and S1-2.    04/07/2014 - 07/28/2014 Chemotherapy    RVD with Revlimid at 25 mg days 1-14.  Revlimid dose reduced to 25 mg every other day x 14 days with 7 day respite beginning on day 1 of cycle 2.    04/17/2014 Adverse Reaction    Revlimid-induced rash.  Treated with steroids.    07/28/2014 - 08/04/2014  Chemotherapy    Revlimid daily    08/04/2014 Adverse Reaction    Velcade-induced peripheral neuropathy    08/04/2014 Treatment Plan Change    D/C Velcade    08/11/2014 PET scan    Response to therapy. Improvement and resolution of foci of osseous hypermetabolism.    08/22/2014 - 08/28/2014 Chemotherapy    Revlimid 25 mg days 1-21 every 28 days    08/28/2014 Adverse Reaction    Revlimid-induced rash. Medication held.  Medrol dose Pak prescribed.    09/04/2014 - 01/08/2015 Chemotherapy    Revlimid 10 mg every other day, without dexamethasone    01/08/2015 - 01/15/2015 Hospital Admission    Colonic diverticular abscess with IR drain placement    03/02/2015 PET scan    Only bony uptake is low activity at site of deformity/callus at R second rib FX, high activity in sigmoid colon, advanced sigmoid divertidulosis. Abscess not resolved?    06/04/2015 Imaging    CT nonobstructive L renal calculus, diverticulosis of descending and sigmoid colon without inflammation, moderate prostatic enlargement    07/12/2015 Surgery    Diverticulitis s/p robotic sigmoid colectomy with Dr. Johney Maine    08/23/2015 PET scan    Primarily similar hypermetabolic osseous foci within the R sided ribs. new L third rib focus of hypermetab and sclerosis is favored to be related to healing FX. no soft tissue myeloma id'd. Presumed postop hypermetab and edema about the R pelvic wall  03/06/2016 PET scan    1. Reduced activity in the prior lesion such as the right second and fifth rib lesions, activity nearly completely resolved and significantly less than added mediastinal blood pool activity. No new lesions are identified. 2. Other imaging findings of potential clinical significance: Coronary, aortic arch, and branch vessel atherosclerotic vascular disease. Aortoiliac atherosclerotic vascular disease. Enlarged prostate gland. Colonic diverticula.    02/17/2017 PET scan    HEAD/NECK: No hypermetabolic activity in the scalp. No  hypermetabolic cervical lymph nodes. CHEST: No hypermetabolic mediastinal or hilar nodes. Right upper lobe scarring/atelectasis. No suspicious pulmonary nodules on the CT scan. ABDOMEN/PELVIS: No abnormal hypermetabolic activity within the liver, pancreas, adrenal glands, or spleen. No hypermetabolic lymph nodes in the abdomen or pelvis. Atherosclerotic calcifications of the abdominal aorta and branch vessels. Left renal sinus cysts. Sigmoid diverticulosis, without evidence of diverticulitis. Prostatomegaly. SKELETON: Vague hypermetabolism involving the inferior sternum, max SUV 3.2, previously 8.2. Focal hypermetabolism involving the left sacrum, max SUV 4.0, previously 6.7. EXTREMITIES: No abnormal hypermetabolic activity in the lower extremities.    02/18/2017 -  Chemotherapy    Bortezomib 1.44m/m2 QWk + Dexamethasone 116mQTue/Fri --Cycle #1, 02/18/17     02/18/2017 - 08/26/2017 Chemotherapy    The patient had bortezomib SQ (VELCADE) chemo injection 3 mg, 1.3 mg/m2 = 3 mg, Subcutaneous,  Once, 7 of 9 cycles Administration: 3 mg (02/18/2017), 3 mg (02/26/2017), 3 mg (03/04/2017), 3 mg (03/11/2017), 3 mg (04/08/2017), 3 mg (03/18/2017), 3 mg (03/25/2017), 3 mg (04/01/2017), 3 mg (05/06/2017), 3 mg (05/20/2017), 3 mg (06/03/2017), 3 mg (06/17/2017), 3 mg (07/01/2017), 3 mg (07/15/2017), 3 mg (07/29/2017), 3 mg (08/12/2017), 3 mg (08/26/2017)  for chemotherapy treatment.     09/14/2017 -  Chemotherapy    The patient had daratumumab (DARZALEX) 1,800 mg in sodium chloride 0.9 % 910 mL (1.8 mg/mL) chemo infusion, 15.9 mg/kg = 1,820 mg, Intravenous, Once, 1 of 1 cycle Administration: 1,800 mg (09/17/2017) daratumumab (DARZALEX) 1,820 mg in sodium chloride 0.9 % 409 mL (3.64 mg/mL) chemo infusion, 16 mg/kg = 1,820 mg, Intravenous, Once, 1 of 7 cycles  for chemotherapy treatment.       INTERVAL HISTORY:  [name].o. male returns for routine follow-up for multiple myeloma. Patient is here today with his daughter and  wife. Patient had a reaction to the pomalidomide. He stopped the medication yesterday. His truck and legs have a rash and are red and itching. He started taking predinsone yesterday. He has an appointment for his Port to be placed tomorrow. Then radiation starts Tuesday. He will have radiation for 10 days then he will return for his treatment. Patient has been fatigued throughout the day and has to rest often. He has slight numbness in his feet and legs. Patient denies any mouth sores. Denies any nausea,vomiting, or diarrhea. Denies any headaches.     REVIEW OF SYSTEMS:  Review of Systems  Constitutional: Positive for fatigue.  Cardiovascular: Positive for leg swelling.  Skin: Positive for itching and rash.  Neurological: Positive for numbness.  Hematological: Bruises/bleeds easily.  All other systems reviewed and are negative.    PAST MEDICAL/SURGICAL HISTORY:  Past Medical History:  Diagnosis Date  . BPH (benign prostatic hyperplasia)   . Colonic diverticular abscess 01/08/2015  . Diverticulitis 1123/76/28 complicated by abscess and required percutaneous drainage  . GERD (gastroesophageal reflux disease)   . H/O ETOH abuse   . History of chemotherapy last done jan 2017  . History of radiation therapy  01/05/13-02/10/13   45 gray to left occipital condyle region  . History of radiation therapy 12/01/16-12/10/16   Parasternal nodule, chest- 24 Gy total delivered in 8 fractions, Left sacro-iliac, pelvis- 24 Gy total delivered in 8 fractions   . History of radiation therapy 02/19/17-02/22/17   right temporal scalp 30 Gy in 10 fractions  . Intra-abdominal abscess ([place])   . Multiple myeloma ([place]) 2015  . Neuropathy   . Radiation 02/21/14-03/08/14   right posterior chest wall area 30 gray  . Radiation 04/19/14-05/02/14   lumbar spine 25 gray  . Skull lesion    Left occipital condyle  . Wrist fracture, left    x 2   Past Surgical History:  Procedure Laterality Date  . COLONOSCOPY N/A  04/19/2015   Procedure: COLONOSCOPY;  Surgeon: Danie Binder, MD;  Location: [facility];  Service: Endoscopy;  Laterality: N/A;  1:30 PM  . CYST EXCISION  1959   tail bone     SOCIAL HISTORY:  Social History   Socioeconomic History  . Marital status: Married    Spouse name: Not on file  . Number of children: 3  . Years of education: Not on file  . Highest education level: Not on file  Occupational History    Employer: RETIRED  Social Needs  . Financial resource strain: Not on file  . Food insecurity:    Worry: Not on file    Inability: Not on file  . Transportation needs:    Medical: Not on file    Non-medical: Not on file  Tobacco Use  . Smoking status: Never Smoker  . Smokeless tobacco: Never Used  Substance and Sexual Activity  . Alcohol use: No    Comment: " Not much no more"  . Drug use: No  . Sexual activity: Not on file  Lifestyle  . Physical activity:    Days per week: Not on file    Minutes per session: Not on file  . Stress: Not on file  Relationships  . Social connections:    Talks on phone: Not on file    Gets together: Not on file    Attends religious service: Not on file    Active member of club or organization: Not on file    Attends meetings of clubs or organizations: Not on file    Relationship status: Not on file  . Intimate partner violence:    Fear of current or ex partner: Not on file    Emotionally abused: Not on file    Physically abused: Not on file    Forced sexual activity: Not on file  Other Topics Concern  . Not on file  Social History Narrative  . Not on file    FAMILY HISTORY:  Family History  Problem Relation Age of Onset  . Diabetes Mother   . Stroke Mother   . Hypertension Father     CURRENT MEDICATIONS:  Outpatient Encounter Medications as of 09/24/2017  Medication Sig Note  . acyclovir (ZOVIRAX) 400 MG tablet Take 1 tablet (400 mg total) by mouth 2 (two) times daily.   Marland Kitchen ALPRAZolam (XANAX) 1 MG tablet TAKE 2  TABLETS BY MOUTH AT BEDTIME AS NEEDED FOR ANXIETY AND OR SLEEP. (Patient taking differently: Take 0.5-2 mg by mouth at bedtime as needed for anxiety or sleep. )   . aspirin EC 325 MG tablet Take 1 tablet (325 mg total) by mouth daily.   Marland Kitchen CALCIUM-VITAMIN D PO Take 1 tablet by mouth  2 (two) times daily.    Marland Kitchen dexamethasone (DECADRON) 4 MG tablet Take 2.5 tablets (10 mg total) by mouth once a week. Take 10 mg once weekly 09/18/2017: Started on 09/18/17  . furosemide (LASIX) 20 MG tablet Take 1 tablet (20 mg total) by mouth daily as needed for edema.   . gabapentin (NEURONTIN) 400 MG capsule TAKE 5 CAPSULES BY MOUTH DAILY (Patient taking differently: Take 400 mg by mouth 2 (two) times daily. )   . HYDROcodone-acetaminophen (NORCO/VICODIN) 5-325 MG tablet TAKE 1 TABLET BY MOUTH EVERY 6 HOURS AS NEEDED FOR MODERATE PAIN (Patient taking differently: Take 1-2 tablets by mouth every 6 (six) hours as needed for severe pain. )   . ibuprofen (ADVIL,MOTRIN) 800 MG tablet TAKE 1 TABLET BY MOUTH EVERY 8 HOURS AS NEEDED FOR MODERATE PAIN.   Marland Kitchen lidocaine-prilocaine (EMLA) cream Apply small amount over port one (1) hour prior to appointment.   Marland Kitchen omeprazole (PRILOSEC) 20 MG capsule Take 1 capsule (20 mg total) by mouth daily. (Patient taking differently: Take 20-40 mg by mouth daily as needed (heartburn). )   . oxyCODONE 10 MG TABS Take 1 tablet (10 mg total) by mouth every 8 (eight) hours as needed for severe pain.   . pomalidomide (POMALYST) 2 MG capsule Take 1 capsule (2 mg total) by mouth daily. Take with water on days 1-21. Repeat every 28 days. (Patient not taking: Reported on 09/24/2017) 09/18/2017: Started the 21 day course on 09/17/17.  Marland Kitchen tamsulosin (FLOMAX) 0.4 MG CAPS capsule Take 1 capsule (0.4 mg total) by mouth at bedtime.   Marland Kitchen zolendronic acid (ZOMETA) 4 MG/5ML injection Inject 4 mg into the vein every 30 (thirty) days.     No facility-administered encounter medications on file as of 09/24/2017.     ALLERGIES:   Allergies  Allergen Reactions  . Codeine Other (See Comments)    Headache  . Morphine And Related Other (See Comments)    Makes him feel weird  . Revlimid [Lenalidomide] Other (See Comments)    "Causes me to become weak"     PHYSICAL EXAM:  ECOG Performance status: 1  Vitals:   09/24/17 0828  BP: (!) 131/59  Pulse: 71  Resp: 18  Temp: 97.8 F (36.6 C)  SpO2: 99%   Filed Weights   09/24/17 0828  Weight: 248 lb 6.4 oz (112.7 kg)    Physical Exam  Constitutional: He is oriented to person, place, and time. He appears well-developed and well-nourished.  Cardiovascular: Normal rate, regular rhythm and normal heart sounds.  Pulmonary/Chest: Effort normal and breath sounds normal.  Neurological: He is alert and oriented to person, place, and time.  Skin: Skin is warm and dry.     LABORATORY DATA:  I have reviewed the labs as listed.  CBC    Component Value Date/Time   WBC 6.9 09/24/2017 0803   RBC 4.19 (L) 09/24/2017 0803   HGB 13.1 09/24/2017 0803   HCT 39.8 09/24/2017 0803   PLT 82 (L) 09/24/2017 0803   MCV 95.0 09/24/2017 0803   MCH 31.3 09/24/2017 0803   MCHC 32.9 09/24/2017 0803   RDW 13.7 09/24/2017 0803   LYMPHSABS 0.2 (L) 09/24/2017 0803   MONOABS 0.1 09/24/2017 0803   EOSABS 0.0 09/24/2017 0803   BASOSABS 0.0 09/24/2017 0803   CMP Latest Ref Rng & Units 09/24/2017 09/17/2017 09/09/2017  Glucose 70 - 99 mg/dL 143(H) 110(H) 139(H)  BUN 8 - 23 mg/dL 18 18 18   Creatinine 0.61 - 1.24 mg/dL  0.72 0.83 0.87  Sodium 135 - 145 mmol/L 141 138 138  Potassium 3.5 - 5.1 mmol/L 4.2 4.3 3.9  Chloride 98 - 111 mmol/L 110 104 107  CO2 22 - 32 mmol/L 23 25 22   Calcium 8.9 - 10.3 mg/dL 8.4(L) 8.9 8.7(L)  Total Protein 6.5 - 8.1 g/dL 5.5(L) 6.5 6.1(L)  Total Bilirubin 0.3 - 1.2 mg/dL 0.7 1.2 0.6  Alkaline Phos 38 - 126 U/L 24(L) 25(L) 25(L)  AST 15 - 41 U/L 16 18 25   ALT 0 - 44 U/L 15 14 15        DIAGNOSTIC IMAGING:  We have reviewed images of the MRI of the  thoracic and lumbar spine.     ASSESSMENT & PLAN:   Multiple myeloma without remission ([facility]) 1.  IgG kappa multiple myeloma: - Initially treated with RVD from 04/07/2014 through 07/28/2014, placed on maintenance Revlimid, which was discontinued 1 and half year ago, unknown reasons, patient thinks it was discontinued when he was receiving radiation. -MRI in January of the brain showed new right temporal lesion, status post radiation therapy, PET/CT scan in January 2019 showed improvement of multiple myeloma with no new lesions, started on weekly Velcade and dexamethasone on 02/18/2017, status post 2 cycles, Velcade changed to every other week on 05/06/2017 secondary to neuropathy, takes dexamethasone 20 mg on days of Velcade. - We reviewed the results of the M spike which was stable at 0.3.  Free light chain ratio went up to 4. -Because of persistent back pain, I have done an MRI of the thoracic and lumbar spine.  We discussed the results of the scans today.  He had a new rib lesion close to the spine on the left side, possibly eighth rib.  There is a soft tissue mass in that area.  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  There is also chronic L4 compression fracture which is stable. - Because of the new developments, I will discontinue Velcade.  I have recommended a much stronger regimen containing daratumumab, pomalidomide and dexamethasone.  Daratumumab will be given weekly for the first 8 weeks.  Pomalidomide will be started at a reduced dose of 2 mg, 21 days on, 7 days off.  Dexamethasone will be taken at a flat dose of 10 mg on a weekly basis.  We talked about the side effects of this regimen in detail. -He was started on first cycle of daratumumab, pomalidomide and dexamethasone on 09/17/2017.  He tolerated daratumumab very well.  However he got erythematous maculopapular rash involving the trunk for the last 2 days.  We have started him on prednisone 60 mg daily for 5 days.   The rash is slightly better today.  We have discontinued pomalidomide at this time.  This is most likely rash related to pomalidomide.  He is having a port placed tomorrow.  We will hold off on further treatments until he finishes radiation therapy to the rib lesion. -We will tentatively start radiation therapy on 09/29/2017 for approximately 10 sessions.  He will initiate daratumumab after the radiation is completed.  2.  Peripheral neuropathy: He has numbness and tingling in his feet and legs, extending up to his knees.  This has been stable since last visit when we cut back on Velcade.  He is taking Neurontin twice daily.  If he takes more, he gets lightheadedness and forgetfulness.  3.  Bone protection: -He is receiving Zometa every 8 weeks.  His last Zometa was on 08/12/2017.  4.  Back pain: -This is from underlying degenerative disc disease as well as multiple myeloma. -He is currently taking oxycodone 10 mg every 8 hours as needed.  Hopefully the radiation will help control his pain.       Orders placed this encounter:  Orders Placed This Encounter  Procedures  . CBC with Differential/Platelet  . Comprehensive metabolic panel  . Lactate dehydrogenase  . Protein electrophoresis, serum  . Immunofixation electrophoresis  . Kappa/lambda light chains      Derek Jack, [place] [phone]

## 2017-09-24 NOTE — Patient Instructions (Addendum)
[facility] at [facility] Discharge Instructions  Follow up in 5 weeks with labs    Thank you for choosing [facility] at [facility] to provide your oncology and hematology care.  To afford each patient quality time with our provider, please arrive at least 15 minutes before your scheduled appointment time.   If you have a lab appointment with the Cancer Center please come in thru the  Main Entrance and check in at the main information desk  You need to re-schedule your appointment should you arrive 10 or more minutes late.  We strive to give you quality time with our providers, and arriving late affects you and other patients whose appointments are after yours.  Also, if you no show three or more times for appointments you may be dismissed from the clinic at the providers discretion.     Again, thank you for choosing [facility].  Our hope is that these requests will decrease the amount of time that you wait before being seen by our physicians.       _____________________________________________________________  Should you have questions after your visit to [facility], please contact our office at [phone] between the hours of 8:00 a.m. and 4:30 p.m.  Voicemails left after 4:00 p.m. will not be returned until the following business day.  For prescription refill requests, have your pharmacy contact our office and allow 72 hours.    Cancer Center Support Programs:   > Cancer Support Group  2nd Tuesday of the month 1pm-2pm, Journey Room    

## 2017-09-24 NOTE — Progress Notes (Signed)
No treatment today verbal order Dr. Katragadda.  

## 2017-09-25 ENCOUNTER — Encounter (HOSPITAL_COMMUNITY): Payer: Self-pay

## 2017-09-25 ENCOUNTER — Ambulatory Visit (HOSPITAL_COMMUNITY)
Admission: RE | Admit: 2017-09-25 | Discharge: 2017-09-25 | Disposition: A | Discharge status: Home / Self Care | Payer: Medicare Other | Source: Ambulatory Visit | Attending: Hematology | Admitting: Hematology

## 2017-09-25 DIAGNOSIS — Z7982 Long term (current) use of aspirin: Secondary | ICD-10-CM | POA: Diagnosis not present

## 2017-09-25 DIAGNOSIS — C9000 Multiple myeloma not having achieved remission: Secondary | ICD-10-CM | POA: Insufficient documentation

## 2017-09-25 DIAGNOSIS — K219 Gastro-esophageal reflux disease without esophagitis: Secondary | ICD-10-CM | POA: Diagnosis not present

## 2017-09-25 DIAGNOSIS — N400 Enlarged prostate without lower urinary tract symptoms: Secondary | ICD-10-CM | POA: Insufficient documentation

## 2017-09-25 DIAGNOSIS — G629 Polyneuropathy, unspecified: Secondary | ICD-10-CM | POA: Diagnosis not present

## 2017-09-25 DIAGNOSIS — Z885 Allergy status to narcotic agent status: Secondary | ICD-10-CM | POA: Insufficient documentation

## 2017-09-25 DIAGNOSIS — Z452 Encounter for adjustment and management of vascular access device: Secondary | ICD-10-CM | POA: Diagnosis not present

## 2017-09-25 DIAGNOSIS — Z5111 Encounter for antineoplastic chemotherapy: Secondary | ICD-10-CM | POA: Diagnosis not present

## 2017-09-25 HISTORY — PX: IR IMAGING GUIDED PORT INSERTION: IMG5740

## 2017-09-25 LAB — PROTIME-INR
INR: 1.04
Prothrombin Time: 13.5 seconds (ref 11.4–15.2)

## 2017-09-25 LAB — BASIC METABOLIC PANEL
Anion gap: 6 (ref 5–15)
BUN: 17 mg/dL (ref 8–23)
CO2: 23 mmol/L (ref 22–32)
Calcium: 8.4 mg/dL — ABNORMAL LOW (ref 8.9–10.3)
Chloride: 111 mmol/L (ref 98–111)
Creatinine, Ser: 0.72 mg/dL (ref 0.61–1.24)
GFR calc Af Amer: >60 mL/min (ref >60)
GFR calc non Af Amer: >60 mL/min (ref >60)
Glucose, Bld: 101 mg/dL — ABNORMAL HIGH (ref 70–99)
Potassium: 3.7 mmol/L (ref 3.5–5.1)
Sodium: 140 mmol/L (ref 135–145)

## 2017-09-25 LAB — CBC
HCT: 39.6 % (ref 39.0–52.0)
Hemoglobin: 13.2 g/dL (ref 13.0–17.0)
MCH: 31.7 pg (ref 26.0–34.0)
MCHC: 33.3 g/dL (ref 30.0–36.0)
MCV: 95.2 fL (ref 78.0–100.0)
Platelets: 103 10*3/uL — ABNORMAL LOW (ref 150–400)
RBC: 4.16 MIL/uL — ABNORMAL LOW (ref 4.22–5.81)
RDW: 13.4 % (ref 11.5–15.5)
WBC: 6.9 10*3/uL (ref 4.0–10.5)

## 2017-09-25 MED ORDER — LIDOCAINE HCL 1 % IJ SOLN
INTRAMUSCULAR | Status: AC | PRN
  Administered 2017-09-25: 10 mL

## 2017-09-25 MED ORDER — CEFAZOLIN SODIUM-DEXTROSE 2-4 GM/100ML-% IV SOLN
2.0000 g | INTRAVENOUS | Status: AC
  Administered 2017-09-25: 2 g via INTRAVENOUS

## 2017-09-25 MED ORDER — CEFAZOLIN SODIUM-DEXTROSE 2-4 GM/100ML-% IV SOLN
INTRAVENOUS | Status: AC
  Filled 2017-09-25: qty 100

## 2017-09-25 MED ORDER — MIDAZOLAM HCL 2 MG/2ML IJ SOLN
INTRAMUSCULAR | Status: AC | PRN
  Administered 2017-09-25: 0.5 mg via INTRAVENOUS
  Administered 2017-09-25: 1 mg via INTRAVENOUS
  Administered 2017-09-25: 0.5 mg via INTRAVENOUS

## 2017-09-25 MED ORDER — FENTANYL CITRATE (PF) 100 MCG/2ML IJ SOLN
INTRAMUSCULAR | Status: AC | PRN
  Administered 2017-09-25 (×2): 25 ug via INTRAVENOUS
  Administered 2017-09-25: 50 ug via INTRAVENOUS

## 2017-09-25 MED ORDER — MIDAZOLAM HCL 2 MG/2ML IJ SOLN
INTRAMUSCULAR | Status: AC
  Filled 2017-09-25: qty 4

## 2017-09-25 MED ORDER — HEPARIN SOD (PORK) LOCK FLUSH 100 UNIT/ML IV SOLN
INTRAVENOUS | Status: AC
  Filled 2017-09-25: qty 5

## 2017-09-25 MED ORDER — SODIUM CHLORIDE 0.9 % IV SOLN: INTRAVENOUS | Status: DC

## 2017-09-25 MED ORDER — HEPARIN SOD (PORK) LOCK FLUSH 100 UNIT/ML IV SOLN
INTRAVENOUS | Status: AC | PRN
  Administered 2017-09-25: 500 [IU] via INTRAVENOUS

## 2017-09-25 MED ORDER — FENTANYL CITRATE (PF) 100 MCG/2ML IJ SOLN
INTRAMUSCULAR | Status: AC
  Filled 2017-09-25: qty 4

## 2017-09-25 MED ORDER — LIDOCAINE HCL 1 % IJ SOLN
INTRAMUSCULAR | Status: AC
  Filled 2017-09-25: qty 20

## 2017-09-25 NOTE — Discharge Instructions (Addendum)
Implanted Port Insertion, Care After °This sheet gives you information about how to care for yourself after your procedure. Your health care provider may also give you more specific instructions. If you have problems or questions, contact your health care provider. °What can I expect after the procedure? °After your procedure, it is common to have: °· Discomfort at the port insertion site. °· Bruising on the skin over the port. This should improve over 3-4 days. ° °Follow these instructions at home: °Port care °· After your port is placed, you will get a manufacturer's information card. The card has information about your port. Keep this card with you at all times. °· Take care of the port as told by your health care provider. Ask your health care provider if you or a family member can get training for taking care of the port at home. A home health care nurse may also take care of the port. °· Make sure to remember what type of port you have. °Incision care °· Follow instructions from your health care provider about how to take care of your port insertion site. Make sure you: °? Wash your hands with soap and water before you change your bandage (dressing). If soap and water are not available, use hand sanitizer. °? Change your dressing as told by your health care provider. °? Leave stitches (sutures), skin glue, or adhesive strips in place. These skin closures may need to stay in place for 2 weeks or longer. If adhesive strip edges start to loosen and curl up, you may trim the loose edges. Do not remove adhesive strips completely unless your health care provider tells you to do that. °· Check your port insertion site every day for signs of infection. Check for: °? More redness, swelling, or pain. °? More fluid or blood. °? Warmth. °? Pus or a bad smell. °General instructions °· Do not take baths, swim, or use a hot tub until your health care provider approves. °· Do not lift anything that is heavier than 10 lb (4.5  kg) for a week, or as told by your health care provider. °· Ask your health care provider when it is okay to: °? Return to work or school. °? Resume usual physical activities or sports. °· Do not drive for 24 hours if you were given a medicine to help you relax (sedative). °· Take over-the-counter and prescription medicines only as told by your health care provider. °· Wear a medical alert bracelet in case of an emergency. This will tell any health care providers that you have a port. °· Keep all follow-up visits as told by your health care provider. This is important. °Contact a health care provider if: °· You cannot flush your port with saline as directed, or you cannot draw blood from the port. °· You have a fever or chills. °· You have more redness, swelling, or pain around your port insertion site. °· You have more fluid or blood coming from your port insertion site. °· Your port insertion site feels warm to the touch. °· You have pus or a bad smell coming from the port insertion site. °Get help right away if: °· You have chest pain or shortness of breath. °· You have bleeding from your port that you cannot control. °Summary °· Take care of the port as told by your health care provider. °· Change your dressing as told by your health care provider. °· Keep all follow-up visits as told by your health care provider. °  This information is not intended to replace advice given to you by your health care provider. Make sure you discuss any questions you have with your health care provider. °Document Released: 11/10/2012 Document Revised: 12/12/2015 Document Reviewed: 12/12/2015 °Elsevier Interactive Patient Education © 2017 Else[employer] Inc[employer] °Moderate Conscious Sedation, Adult, Care After °These instructions provide you with information about caring for yourself after your procedure. Your health care provider may also give you more specific instructions. Your treatment has been planned according to current medical  practices, but problems sometimes occur. Call your health care provider if you have any problems or questions after your procedure. °What can I expect after the procedure? °After your procedure, it is common: °· To feel sleepy for several hours. °· To feel clumsy and have poor balance for several hours. °· To have poor judgment for several hours. °· To vomit if you eat too soon. ° °Follow these instructions at home: °For at least 24 hours after the procedure: ° °· Do not: °? Participate in activities where you could fall or become injured. °? Drive. °? Use heavy machinery. °? Drink alcohol. °? Take sleeping pills or medicines that cause drowsiness. °? Make important decisions or sign legal documents. °? Take care of children on your own. °· Rest. °Eating and drinking °· Follow the diet recommended by your health care provider. °· If you vomit: °? Drink water, juice, or soup when you can drink without vomiting. °? Make sure you have little or no nausea before eating solid foods. °General instructions °· Have a responsible adult stay with you until you are awake and alert. °· Take over-the-counter and prescription medicines only as told by your health care provider. °· If you smoke, do not smoke without supervision. °· Keep all follow-up visits as told by your health care provider. This is important. °Contact a health care provider if: °· You keep feeling nauseous or you keep vomiting. °· You feel light-headed. °· You develop a rash. °· You have a fever. °Get help right away if: °· You have trouble breathing. °This information is not intended to replace advice given to you by your health care provider. Make sure you discuss any questions you have with your health care provider. °Document Released: 11/10/2012 Document Revised: 06/25/2015 Document Reviewed: 05/12/2015 °Elsevier Interactive Patient Education © 2018 Elsevier Inc. ° °

## 2017-09-25 NOTE — Procedures (Signed)
RIJV PAC SVC RA EBL 0 Comp 0 

## 2017-09-25 NOTE — H&P (Signed)
Chief Complaint: Patient was seen in consultation today for [facility] a Cath placement at the request of Katragadda,Sreedhar  Referring Physician(s): Firefighter  Supervising Physician: Marybelle Killings  Patient Status: [facility] - Out-pt  History of Present Illness: Dean [name]. is a 82 y.o. male   Multiple Myeloma- recurrence On going Radiation treatments To start chemo therapy soon Need for [facility] placement per MD Scheduled for this now   Past Medical History:  Diagnosis Date  . BPH (benign prostatic hyperplasia)   . Colonic diverticular abscess 01/08/2015  . Diverticulitis 81/82/99   complicated by abscess and required percutaneous drainage  . GERD (gastroesophageal reflux disease)   . H/O ETOH abuse   . History of chemotherapy last done jan 2017  . History of radiation therapy 01/05/13-02/10/13   45 gray to left occipital condyle region  . History of radiation therapy 12/01/16-12/10/16   Parasternal nodule, chest- 24 Gy total delivered in 8 fractions, Left sacro-iliac, pelvis- 24 Gy total delivered in 8 fractions   . History of radiation therapy 02/19/17-02/22/17   right temporal scalp 30 Gy in 10 fractions  . Intra-abdominal abscess ([place])   . Multiple myeloma ([place]) 2015  . Neuropathy   . Radiation 02/21/14-03/08/14   right posterior chest wall area 30 gray  . Radiation 04/19/14-05/02/14   lumbar spine 25 gray  . Skull lesion    Left occipital condyle  . Wrist fracture, left    x 2    Past Surgical History:  Procedure Laterality Date  . COLONOSCOPY N/A 04/19/2015   Procedure: COLONOSCOPY;  Surgeon: Danie Binder, MD;  Location: [facility]O SUITE;  Service: Endoscopy;  Laterality: N/A;  1:30 PM  . CYST EXCISION  1959   tail bone    Allergies: Codeine; Morphine and related; and Revlimid [lenalidomide]  Medications: Prior to Admission medications   Medication Sig Start Date End Date Taking? Authorizing Provider  acyclovir (ZOVIRAX) 400 MG tablet Take 1 tablet  (400 mg total) by mouth 2 (two) times daily. 05/20/17  Yes Holley Bouche, NP  ALPRAZolam Duanne Moron) 1 MG tablet TAKE 2 TABLETS BY MOUTH AT BEDTIME AS NEEDED FOR ANXIETY AND OR SLEEP. Patient taking differently: Take 0.5-2 mg by mouth at bedtime as needed for anxiety or sleep.  09/18/17  Yes [name], MD  aspirin EC 325 MG tablet Take 1 tablet (325 mg total) by mouth daily. 03/09/14  Yes [place], Kelby Fam, MD  CALCIUM-VITAMIN D PO Take 1 tablet by mouth 2 (two) times daily.    Yes [provider]  dexamethasone (DECADRON) 4 MG tablet Take 2.5 tablets (10 mg total) by mouth once a week. Take 10 mg once weekly 09/16/17  Yes Derek Jack, MD  furosemide (LASIX) 20 MG tablet Take 1 tablet (20 mg total) by mouth daily as needed for edema. 07/24/17  Yes [name], MD  gabapentin (NEURONTIN) 400 MG capsule TAKE 5 CAPSULES BY MOUTH DAILY Patient taking differently: Take 400 mg by mouth 2 (two) times daily.  03/29/17  Yes Holley Bouche, NP  HYDROcodone-acetaminophen (NORCO/VICODIN) 5-325 MG tablet TAKE 1 TABLET BY MOUTH EVERY 6 HOURS AS NEEDED FOR MODERATE PAIN Patient taking differently: Take 1-2 tablets by mouth every 6 (six) hours as needed for severe pain.  07/31/17  Yes [place], Modena Nunnery, MD  lidocaine-prilocaine (EMLA) cream Apply small amount over port one (1) hour prior to appointment. 09/16/17  Yes Derek Jack, MD  MAGNESIUM PO Take 1 tablet by mouth at bedtime.  Yes [provider]  omeprazole (PRILOSEC) 20 MG capsule Take 1 capsule (20 mg total) by mouth daily. Patient taking differently: Take 20-40 mg by mouth daily as needed (heartburn).  08/21/16  Yes Kefalas, Manon Hilding, PA-C  oxyCODONE 10 MG TABS Take 1 tablet (10 mg total) by mouth every 8 (eight) hours as needed for severe pain. 09/09/17  Yes Lockamy, Randi L, NP-C  pomalidomide (POMALYST) 2 MG capsule Take 1 capsule (2 mg total) by mouth daily. Take with water on days 1-21. Repeat every 28  days. Patient not taking: Reported on 09/24/2017 09/09/17  Yes Derek Jack, MD  predniSONE (DELTASONE) 20 MG tablet Take 20 mg by mouth daily with breakfast. Take 60 mg by mouth for 5 days.   Yes [provider]  tamsulosin (FLOMAX) 0.4 MG CAPS capsule Take 1 capsule (0.4 mg total) by mouth at bedtime. 09/18/17  Yes Derek Jack, MD  zolendronic acid (ZOMETA) 4 MG/5ML injection Inject 4 mg into the vein every 30 (thirty) days.    Yes [provider]  ibuprofen (ADVIL,MOTRIN) 800 MG tablet TAKE 1 TABLET BY MOUTH EVERY 8 HOURS AS NEEDED FOR MODERATE PAIN. 03/29/17   Holley Bouche, NP     Family History  Problem Relation Age of Onset  . Diabetes Mother   . Stroke Mother   . Hypertension Father     Social History   Socioeconomic History  . Marital status: Married    Spouse name: Not on file  . Number of children: 3  . Years of education: Not on file  . Highest education level: Not on file  Occupational History    Employer: RETIRED  Social Needs  . Financial resource strain: Not on file  . Food insecurity:    Worry: Not on file    Inability: Not on file  . Transportation needs:    Medical: Not on file    Non-medical: Not on file  Tobacco Use  . Smoking status: Never Smoker  . Smokeless tobacco: Never Used  Substance and Sexual Activity  . Alcohol use: No    Comment: " Not much no more"  . Drug use: No  . Sexual activity: Not on file  Lifestyle  . Physical activity:    Days per week: Not on file    Minutes per session: Not on file  . Stress: Not on file  Relationships  . Social connections:    Talks on phone: Not on file    Gets together: Not on file    Attends religious service: Not on file    Active member of club or organization: Not on file    Attends meetings of clubs or organizations: Not on file    Relationship status: Not on file  Other Topics Concern  . Not on file  Social History Narrative  . Not on file    Review of  Systems: A 12 point ROS discussed and pertinent positives are indicated in the HPI above.  All other systems are negative.  Review of Systems  Constitutional: Positive for fatigue. Negative for activity change, appetite change and unexpected weight change.  Respiratory: Negative for cough and shortness of breath.   Cardiovascular: Negative for chest pain.  Gastrointestinal: Negative for abdominal pain.  Musculoskeletal: Positive for back pain and gait problem.  Neurological: Positive for weakness.  Psychiatric/Behavioral: Negative for behavioral problems and confusion.    Vital Signs: BP (!) 147/71   Pulse 65   Temp 97.7 F (36.5 C) (  Oral)   Resp 18   Ht 5' 8" (1.727 m)   Wt 240 lb (108.9 kg)   SpO2 96%   BMI 36.49 kg/m   Physical Exam  Constitutional: He is oriented to person, place, and time.  Cardiovascular: Normal rate, regular rhythm and normal heart sounds.  Pulmonary/Chest: Effort normal and breath sounds normal.  Abdominal: Soft. Bowel sounds are normal.  Musculoskeletal: Normal range of motion.  Uses cane to steady gait  Neurological: He is alert and oriented to person, place, and time.  Skin: Skin is warm and dry.  Psychiatric: He has a normal mood and affect. His behavior is normal. Judgment and thought content normal.  Nursing note and vitals reviewed.   Imaging: Mr Thoracic Spine W Wo Contrast  Result Date: 09/03/2017 CLINICAL DATA:  Multiple myeloma EXAM: MRI THORACIC AND LUMBAR SPINE WITHOUT AND WITH CONTRAST TECHNIQUE: Multiplanar and multiecho pulse sequences of the thoracic and lumbar spine were obtained without and with intravenous contrast. CONTRAST:  53m MULTIHANCE GADOBENATE DIMEGLUMINE 529 MG/ML IV SOLN COMPARISON:  Lumbar MRI 10/24/2016, PET 02/17/2017 FINDINGS: MRI THORACIC SPINE FINDINGS Alignment:  Normal Vertebrae: Hemangioma T12 vertebral body. No fracture or vertebral body lesion. Schmorl's node inferior endplate of T8. Cord:  Negative for cord  compression.  Normal cord signal. Paraspinal and other soft tissues: Expansile posterior rib lesion on the left, not present on the prior PET scan. Possible eighth rib. Lesion consistent with myeloma. See extends into the extrapleural space. Small left pleural effusion. Minimal right pleural effusion. 1 cm subcutaneous cyst in the posterior back midline at the T4-5 level appears benign. Disc levels: Small central disc protrusions at T3-4 and T4-5. Small central disc protrusion at T6-7. No significant spinal stenosis. Schmorl's node inferior endplate of T8. MRI LUMBAR SPINE FINDINGS Segmentation:  Normal Alignment:  Mild retrolisthesis L1-2 and L2-3 Vertebrae: Mild fracture inferior endplate of S1 bone marrow edema enhancement. This likely a recent fracture. Chronic fracture of L4. Hemangioma T12. 12 mm enhancing lesion and a left sacrum consistent with multiple myeloma. This is identified on the prior PET scan. Conus medullaris: Extends to the L1 level and appears normal. Paraspinal and other soft tissues: Negative for paraspinous mass or edema. Disc levels: L1-2: Advanced disc on the right with extensive degeneration asymmetric spurring on the right causing moderate right foraminal encroachment. No spinal stenosis. L2-3: Moderate disc degeneration. Diffuse disc bulging and endplate spurring right greater than left. Extraforaminal disc and osteophyte complex. Moderate subarticular stenosis bilaterally. L3-4: Large extruded disc fragment on the left with downgoing disc material. This extends across the midline to the right. There is moderate to severe facet degeneration. Severe spinal stenosis. Severe subarticular and foraminal stenosis on the left. Moderate right foraminal stenosis. L4-5: Moderate facet degeneration bilaterally causing moderate subarticular stenosis bilaterally. Diffuse bulging of the disc. L5-S1: Mild disc and facet degeneration without stenosis. IMPRESSION: 1. Expansile lesion left posterior rib  compatible with myeloma, not seen on prior PET. This extends into the extrapleural space. 2. Left sacral lesion 12 mm  noted on prior PET. 3. Chronic fracture L4 vertebral body. Acute or subacute mild fracture S1 vertebral body likely benign 4. Advanced multilevel lumbar degenerative change as above. Of note, there is a large extruded disc fragment on the left at L3-4 causing severe spinal stenosis with severe subarticular foraminal stenosis on the left. Electronically Signed   By: CFranchot GalloM.D.   On: 09/03/2017 13:46   Mr Lumbar Spine W Wo Contrast  Result Date:  09/03/2017 CLINICAL DATA:  Multiple myeloma EXAM: MRI THORACIC AND LUMBAR SPINE WITHOUT AND WITH CONTRAST TECHNIQUE: Multiplanar and multiecho pulse sequences of the thoracic and lumbar spine were obtained without and with intravenous contrast. CONTRAST:  81m MULTIHANCE GADOBENATE DIMEGLUMINE 529 MG/ML IV SOLN COMPARISON:  Lumbar MRI 10/24/2016, PET 02/17/2017 FINDINGS: MRI THORACIC SPINE FINDINGS Alignment:  Normal Vertebrae: Hemangioma T12 vertebral body. No fracture or vertebral body lesion. Schmorl's node inferior endplate of T8. Cord:  Negative for cord compression.  Normal cord signal. Paraspinal and other soft tissues: Expansile posterior rib lesion on the left, not present on the prior PET scan. Possible eighth rib. Lesion consistent with myeloma. See extends into the extrapleural space. Small left pleural effusion. Minimal right pleural effusion. 1 cm subcutaneous cyst in the posterior back midline at the T4-5 level appears benign. Disc levels: Small central disc protrusions at T3-4 and T4-5. Small central disc protrusion at T6-7. No significant spinal stenosis. Schmorl's node inferior endplate of T8. MRI LUMBAR SPINE FINDINGS Segmentation:  Normal Alignment:  Mild retrolisthesis L1-2 and L2-3 Vertebrae: Mild fracture inferior endplate of S1 bone marrow edema enhancement. This likely a recent fracture. Chronic fracture of L4. Hemangioma  T12. 12 mm enhancing lesion and a left sacrum consistent with multiple myeloma. This is identified on the prior PET scan. Conus medullaris: Extends to the L1 level and appears normal. Paraspinal and other soft tissues: Negative for paraspinous mass or edema. Disc levels: L1-2: Advanced disc on the right with extensive degeneration asymmetric spurring on the right causing moderate right foraminal encroachment. No spinal stenosis. L2-3: Moderate disc degeneration. Diffuse disc bulging and endplate spurring right greater than left. Extraforaminal disc and osteophyte complex. Moderate subarticular stenosis bilaterally. L3-4: Large extruded disc fragment on the left with downgoing disc material. This extends across the midline to the right. There is moderate to severe facet degeneration. Severe spinal stenosis. Severe subarticular and foraminal stenosis on the left. Moderate right foraminal stenosis. L4-5: Moderate facet degeneration bilaterally causing moderate subarticular stenosis bilaterally. Diffuse bulging of the disc. L5-S1: Mild disc and facet degeneration without stenosis. IMPRESSION: 1. Expansile lesion left posterior rib compatible with myeloma, not seen on prior PET. This extends into the extrapleural space. 2. Left sacral lesion 12 mm  noted on prior PET. 3. Chronic fracture L4 vertebral body. Acute or subacute mild fracture S1 vertebral body likely benign 4. Advanced multilevel lumbar degenerative change as above. Of note, there is a large extruded disc fragment on the left at L3-4 causing severe spinal stenosis with severe subarticular foraminal stenosis on the left. Electronically Signed   By: CFranchot GalloM.D.   On: 09/03/2017 13:46    Labs:  CBC: Recent Labs    08/26/17 1002 09/09/17 0826 09/17/17 0758 09/24/17 0803  WBC 7.0 5.9 5.1 6.9  HGB 14.9 13.9 14.9 13.1  HCT 43.4 41.0 43.4 39.8  PLT 113* 115* 126* 82*    COAGS: No results for input(s): INR, APTT in the last 8760  hours.  BMP: Recent Labs    08/26/17 1002 09/09/17 0826 09/17/17 0758 09/24/17 0803  NA 139 138 138 141  K 4.4 3.9 4.3 4.2  CL 105 107 104 110  CO2 _0 GLUCOSE 111* 139* 110* 143*  BUN _1 CALCIUM 9.2 8.7* 8.9 8.4*  CREATININE 0.79 0.87 0.83 0.72  GFRNONAA >60 >60 >60 >60  GFRAA >60 >60 >60 >60    LIVER FUNCTION TESTS: Recent Labs    08/26/17  1002 09/09/17 0826 09/17/17 0758 09/24/17 0803  BILITOT 0.6 0.6 1.2 0.7  AST _0 ALT _1 ALKPHOS 25* 25* 25* 24*  PROT 6.8 6.1* 6.5 5.5*  ALBUMIN 3.9 3.5 3.7 3.1*    TUMOR MARKERS: No results for input(s): AFPTM, CEA, CA199, CHROMGRNA in the last 8760 hours.  Assessment and Plan:  Recurrent Multiple Myeloma For PAC today-- to start chemo therapy soon Risks and benefits of image guided port-a-catheter placement was discussed with the patient including, but not limited to bleeding, infection, pneumothorax, or fibrin sheath development and need for additional procedures.  All of the patient's questions were answered, patient is agreeable to proceed. Consent signed and in chart.   Thank you for this interesting consult.  I greatly enjoyed meeting Sheron Robin. and look forward to participating in their care.  A copy of this report was sent to the requesting provider on this date.  Electronically Signed: Lavonia Drafts, PA-C 09/25/2017, 7:40 AM   I spent a total of  30 Minutes   in face to face in clinical consultation, greater than 50% of which was counseling/coordinating care for [facility] placement

## 2017-09-28 DIAGNOSIS — Z510 Encounter for antineoplastic radiation therapy: Secondary | ICD-10-CM | POA: Diagnosis not present

## 2017-09-28 DIAGNOSIS — C9000 Multiple myeloma not having achieved remission: Secondary | ICD-10-CM | POA: Diagnosis not present

## 2017-09-28 DIAGNOSIS — M899 Disorder of bone, unspecified: Secondary | ICD-10-CM | POA: Diagnosis not present

## 2017-09-28 DIAGNOSIS — C7951 Secondary malignant neoplasm of bone: Secondary | ICD-10-CM | POA: Diagnosis not present

## 2017-09-29 ENCOUNTER — Ambulatory Visit
Admission: RE | Admit: 2017-09-29 | Discharge: 2017-09-29 | Disposition: A | Discharge status: Home / Self Care | Payer: Medicare Other | Source: Ambulatory Visit | Attending: Radiation Oncology | Admitting: Radiation Oncology

## 2017-09-29 ENCOUNTER — Encounter (HOSPITAL_COMMUNITY): Payer: Self-pay | Admitting: General Practice

## 2017-09-29 DIAGNOSIS — C9000 Multiple myeloma not having achieved remission: Secondary | ICD-10-CM

## 2017-09-29 DIAGNOSIS — Z510 Encounter for antineoplastic radiation therapy: Secondary | ICD-10-CM | POA: Diagnosis not present

## 2017-09-29 DIAGNOSIS — C7951 Secondary malignant neoplasm of bone: Secondary | ICD-10-CM | POA: Diagnosis not present

## 2017-09-29 DIAGNOSIS — M899 Disorder of bone, unspecified: Secondary | ICD-10-CM | POA: Diagnosis not present

## 2017-09-29 NOTE — Progress Notes (Signed)
[place] Psychosocial Distress Screening Clinical Social Work  Clinical Social Work was referred by distress screening protocol.  The patient scored a 5 on the Psychosocial Distress Thermometer which indicates moderate distress. Clinical Social Worker contacted patient by phone to assess for distress and other psychosocial needs. Reviewed common concerns/questions w patient, patient aware of current treatment plan. Daughters are helping him get to/from appointments, call frequently to check on his welfare.  Daughters are also taking care of patient's ex wife who suffers from dementia.  Patient feels significant support from family and expresses confidence in treatment team.  Is about to start radiation therapy, encouraged to raise any questions/concerns w treatment team. Patient wondering if goal of radiation is to shrink tumor observed on "4th rib" - directed to discuss this w MD or other member of tx team.    [facility] DISTRESS SCREENING 09/23/2017  Screening Type Initial Screening  Distress experienced in past week (1-10) 5  Emotional problem type Nervousness/Anxiety;Adjusting to illness;Boredom  Information Concerns Type Lack of info about treatment  Physical Problem type Pain;Sleep/insomnia;Getting around;Constipation/diarrhea;Tingling hands/feet;Swollen arms/legs  Physician notified of physical symptoms   Referral to clinical social work   Other [phone] or [phone]     Clinical Social Worker follow up needed: No.  If yes, follow up plan:  Beverely Pace, [place], LCSW Clinical Social Worker Phone:  [phone]

## 2017-09-29 NOTE — Progress Notes (Signed)
  Radiation Oncology         [phone]) [phone] ________________________________  Name: Dean [name]. MRN: [id number]  Date: 09/29/2017  DOB: Jul 12, 1935  Simulation Verification Note    ICD-10-CM   1. Multiple myeloma without remission ([place]) C90.00     Status: outpatient  NARRATIVE: The patient was brought to the treatment unit and placed in the planned treatment position. The clinical setup was verified. Then port films were obtained and uploaded to the radiation oncology medical record software.  The treatment beams were carefully compared against the planned radiation fields. The position location and shape of the radiation fields was reviewed. They targeted volume of tissue appears to be appropriately covered by the radiation beams. Organs at risk appear to be excluded as planned.  Based on my personal review, I approved the simulation verification. The patient's treatment will proceed as planned.  -----------------------------------  Blair Promise, PhD, MD

## 2017-09-30 ENCOUNTER — Ambulatory Visit
Admission: RE | Admit: 2017-09-30 | Discharge: 2017-09-30 | Disposition: A | Discharge status: Home / Self Care | Payer: Medicare Other | Source: Ambulatory Visit | Attending: Radiation Oncology | Admitting: Radiation Oncology

## 2017-09-30 DIAGNOSIS — C9000 Multiple myeloma not having achieved remission: Secondary | ICD-10-CM | POA: Diagnosis not present

## 2017-09-30 DIAGNOSIS — M899 Disorder of bone, unspecified: Secondary | ICD-10-CM | POA: Diagnosis not present

## 2017-09-30 DIAGNOSIS — Z510 Encounter for antineoplastic radiation therapy: Secondary | ICD-10-CM | POA: Diagnosis not present

## 2017-09-30 DIAGNOSIS — C7951 Secondary malignant neoplasm of bone: Secondary | ICD-10-CM | POA: Diagnosis not present

## 2017-10-01 ENCOUNTER — Ambulatory Visit
Admission: RE | Admit: 2017-10-01 | Discharge: 2017-10-01 | Disposition: A | Discharge status: Home / Self Care | Payer: Medicare Other | Source: Ambulatory Visit | Attending: Radiation Oncology | Admitting: Radiation Oncology

## 2017-10-01 ENCOUNTER — Ambulatory Visit (HOSPITAL_COMMUNITY): Payer: Medicare Other | Admitting: Hematology

## 2017-10-01 ENCOUNTER — Other Ambulatory Visit (HOSPITAL_COMMUNITY): Payer: Medicare Other

## 2017-10-01 ENCOUNTER — Ambulatory Visit (HOSPITAL_COMMUNITY): Payer: Medicare Other

## 2017-10-01 DIAGNOSIS — C7951 Secondary malignant neoplasm of bone: Secondary | ICD-10-CM | POA: Diagnosis not present

## 2017-10-01 DIAGNOSIS — M899 Disorder of bone, unspecified: Secondary | ICD-10-CM | POA: Diagnosis not present

## 2017-10-01 DIAGNOSIS — Z510 Encounter for antineoplastic radiation therapy: Secondary | ICD-10-CM | POA: Diagnosis not present

## 2017-10-01 DIAGNOSIS — C9000 Multiple myeloma not having achieved remission: Secondary | ICD-10-CM | POA: Diagnosis not present

## 2017-10-02 ENCOUNTER — Ambulatory Visit
Admission: RE | Admit: 2017-10-02 | Discharge: 2017-10-02 | Disposition: A | Discharge status: Home / Self Care | Payer: Medicare Other | Source: Ambulatory Visit | Attending: Radiation Oncology | Admitting: Radiation Oncology

## 2017-10-02 DIAGNOSIS — Z510 Encounter for antineoplastic radiation therapy: Secondary | ICD-10-CM | POA: Diagnosis not present

## 2017-10-02 DIAGNOSIS — C7951 Secondary malignant neoplasm of bone: Secondary | ICD-10-CM | POA: Diagnosis not present

## 2017-10-02 DIAGNOSIS — C9000 Multiple myeloma not having achieved remission: Secondary | ICD-10-CM | POA: Diagnosis not present

## 2017-10-02 DIAGNOSIS — M899 Disorder of bone, unspecified: Secondary | ICD-10-CM | POA: Diagnosis not present

## 2017-10-06 ENCOUNTER — Other Ambulatory Visit (HOSPITAL_COMMUNITY): Payer: Self-pay | Admitting: *Deleted

## 2017-10-06 ENCOUNTER — Ambulatory Visit
Admission: RE | Admit: 2017-10-06 | Discharge: 2017-10-06 | Disposition: A | Discharge status: Home / Self Care | Payer: Medicare Other | Source: Ambulatory Visit | Attending: Radiation Oncology | Admitting: Radiation Oncology

## 2017-10-06 DIAGNOSIS — M899 Disorder of bone, unspecified: Secondary | ICD-10-CM | POA: Diagnosis not present

## 2017-10-06 DIAGNOSIS — C7951 Secondary malignant neoplasm of bone: Secondary | ICD-10-CM | POA: Diagnosis not present

## 2017-10-06 DIAGNOSIS — C9000 Multiple myeloma not having achieved remission: Secondary | ICD-10-CM | POA: Insufficient documentation

## 2017-10-06 DIAGNOSIS — Z510 Encounter for antineoplastic radiation therapy: Secondary | ICD-10-CM | POA: Insufficient documentation

## 2017-10-06 MED ORDER — POMALIDOMIDE 2 MG PO CAPS: 2.0000 mg | ORAL_CAPSULE | Freq: Every day | ORAL | 0 refills | Status: DC

## 2017-10-06 NOTE — Telephone Encounter (Signed)
Chart reviewed, pomalyst refilled.  

## 2017-10-07 ENCOUNTER — Ambulatory Visit
Admission: RE | Admit: 2017-10-07 | Discharge: 2017-10-07 | Disposition: A | Discharge status: Home / Self Care | Payer: Medicare Other | Source: Ambulatory Visit | Attending: Radiation Oncology | Admitting: Radiation Oncology

## 2017-10-07 DIAGNOSIS — M899 Disorder of bone, unspecified: Secondary | ICD-10-CM | POA: Diagnosis not present

## 2017-10-07 DIAGNOSIS — Z510 Encounter for antineoplastic radiation therapy: Secondary | ICD-10-CM | POA: Diagnosis not present

## 2017-10-07 DIAGNOSIS — C7951 Secondary malignant neoplasm of bone: Secondary | ICD-10-CM | POA: Diagnosis not present

## 2017-10-07 DIAGNOSIS — C9000 Multiple myeloma not having achieved remission: Secondary | ICD-10-CM | POA: Diagnosis not present

## 2017-10-08 ENCOUNTER — Ambulatory Visit
Admission: RE | Admit: 2017-10-08 | Discharge: 2017-10-08 | Disposition: A | Discharge status: Home / Self Care | Payer: Medicare Other | Source: Ambulatory Visit | Attending: Radiation Oncology | Admitting: Radiation Oncology

## 2017-10-08 ENCOUNTER — Other Ambulatory Visit (HOSPITAL_COMMUNITY): Payer: Medicare Other

## 2017-10-08 ENCOUNTER — Ambulatory Visit (HOSPITAL_COMMUNITY): Payer: Medicare Other

## 2017-10-08 DIAGNOSIS — C7951 Secondary malignant neoplasm of bone: Secondary | ICD-10-CM | POA: Diagnosis not present

## 2017-10-08 DIAGNOSIS — M899 Disorder of bone, unspecified: Secondary | ICD-10-CM | POA: Diagnosis not present

## 2017-10-08 DIAGNOSIS — Z510 Encounter for antineoplastic radiation therapy: Secondary | ICD-10-CM | POA: Diagnosis not present

## 2017-10-08 DIAGNOSIS — C9000 Multiple myeloma not having achieved remission: Secondary | ICD-10-CM | POA: Diagnosis not present

## 2017-10-09 ENCOUNTER — Ambulatory Visit: Payer: Medicare Other

## 2017-10-12 ENCOUNTER — Ambulatory Visit
Admission: RE | Admit: 2017-10-12 | Discharge: 2017-10-12 | Disposition: A | Discharge status: Home / Self Care | Payer: Medicare Other | Source: Ambulatory Visit | Attending: Radiation Oncology | Admitting: Radiation Oncology

## 2017-10-12 DIAGNOSIS — C7951 Secondary malignant neoplasm of bone: Secondary | ICD-10-CM | POA: Diagnosis not present

## 2017-10-12 DIAGNOSIS — C9000 Multiple myeloma not having achieved remission: Secondary | ICD-10-CM | POA: Diagnosis not present

## 2017-10-12 DIAGNOSIS — Z510 Encounter for antineoplastic radiation therapy: Secondary | ICD-10-CM | POA: Diagnosis not present

## 2017-10-12 DIAGNOSIS — M899 Disorder of bone, unspecified: Secondary | ICD-10-CM | POA: Diagnosis not present

## 2017-10-13 ENCOUNTER — Other Ambulatory Visit: Payer: Self-pay | Admitting: Radiation Oncology

## 2017-10-13 ENCOUNTER — Ambulatory Visit: Payer: Medicare Other

## 2017-10-13 ENCOUNTER — Telehealth: Payer: Self-pay

## 2017-10-13 ENCOUNTER — Ambulatory Visit
Admission: RE | Admit: 2017-10-13 | Discharge: 2017-10-13 | Disposition: A | Discharge status: Home / Self Care | Payer: Medicare Other | Source: Ambulatory Visit | Attending: Radiation Oncology | Admitting: Radiation Oncology

## 2017-10-13 DIAGNOSIS — C9000 Multiple myeloma not having achieved remission: Secondary | ICD-10-CM | POA: Diagnosis not present

## 2017-10-13 DIAGNOSIS — Z510 Encounter for antineoplastic radiation therapy: Secondary | ICD-10-CM | POA: Diagnosis not present

## 2017-10-13 DIAGNOSIS — C7951 Secondary malignant neoplasm of bone: Secondary | ICD-10-CM | POA: Diagnosis not present

## 2017-10-13 DIAGNOSIS — M899 Disorder of bone, unspecified: Secondary | ICD-10-CM | POA: Diagnosis not present

## 2017-10-13 NOTE — Telephone Encounter (Signed)
Per Dr. Sondra Come, this RN contacted pt to convey that Dr. Sondra Come was unable to fill pain medication due to pt's listed allergy to Morphine. Pt verbalized understanding and agreement. Loma Sousa, RN BSN

## 2017-10-14 ENCOUNTER — Ambulatory Visit (HOSPITAL_COMMUNITY): Payer: Medicare Other

## 2017-10-14 ENCOUNTER — Ambulatory Visit
Admission: RE | Admit: 2017-10-14 | Discharge: 2017-10-14 | Disposition: A | Discharge status: Home / Self Care | Payer: Medicare Other | Source: Ambulatory Visit | Attending: Radiation Oncology | Admitting: Radiation Oncology

## 2017-10-14 ENCOUNTER — Other Ambulatory Visit (HOSPITAL_COMMUNITY): Payer: Medicare Other

## 2017-10-14 DIAGNOSIS — C7951 Secondary malignant neoplasm of bone: Secondary | ICD-10-CM | POA: Diagnosis not present

## 2017-10-14 DIAGNOSIS — M899 Disorder of bone, unspecified: Secondary | ICD-10-CM | POA: Diagnosis not present

## 2017-10-14 DIAGNOSIS — C9000 Multiple myeloma not having achieved remission: Secondary | ICD-10-CM | POA: Diagnosis not present

## 2017-10-14 DIAGNOSIS — Z510 Encounter for antineoplastic radiation therapy: Secondary | ICD-10-CM | POA: Diagnosis not present

## 2017-10-15 ENCOUNTER — Other Ambulatory Visit (HOSPITAL_COMMUNITY): Payer: Medicare Other

## 2017-10-15 ENCOUNTER — Inpatient Hospital Stay (HOSPITAL_COMMUNITY): Payer: Medicare Other | Attending: Internal Medicine

## 2017-10-15 ENCOUNTER — Inpatient Hospital Stay (HOSPITAL_COMMUNITY): Payer: Medicare Other

## 2017-10-15 ENCOUNTER — Ambulatory Visit (HOSPITAL_COMMUNITY): Payer: Medicare Other

## 2017-10-15 VITALS — BP 143/75 | HR 98 | Temp 98.8°F | Resp 16

## 2017-10-15 DIAGNOSIS — Z5112 Encounter for antineoplastic immunotherapy: Secondary | ICD-10-CM | POA: Diagnosis not present

## 2017-10-15 DIAGNOSIS — C9000 Multiple myeloma not having achieved remission: Secondary | ICD-10-CM | POA: Insufficient documentation

## 2017-10-15 LAB — COMPREHENSIVE METABOLIC PANEL
ALT: 16 U/L (ref 0–44)
AST: 17 U/L (ref 15–41)
Albumin: 3.5 g/dL (ref 3.5–5.0)
Alkaline Phosphatase: 25 U/L — ABNORMAL LOW (ref 38–126)
Anion gap: 7 (ref 5–15)
BUN: 18 mg/dL (ref 8–23)
CO2: 24 mmol/L (ref 22–32)
Calcium: 9.2 mg/dL (ref 8.9–10.3)
Chloride: 110 mmol/L (ref 98–111)
Creatinine, Ser: 0.92 mg/dL (ref 0.61–1.24)
GFR calc Af Amer: >60 mL/min (ref >60)
GFR calc non Af Amer: >60 mL/min (ref >60)
Glucose, Bld: 103 mg/dL — ABNORMAL HIGH (ref 70–99)
Potassium: 4.4 mmol/L (ref 3.5–5.1)
Sodium: 141 mmol/L (ref 135–145)
Total Bilirubin: 0.6 mg/dL (ref 0.3–1.2)
Total Protein: 6.7 g/dL (ref 6.5–8.1)

## 2017-10-15 LAB — CBC WITH DIFFERENTIAL/PLATELET
Basophils Absolute: 0 10*3/uL (ref 0.0–0.1)
Basophils Relative: 0 %
Eosinophils Absolute: 0 10*3/uL (ref 0.0–0.7)
Eosinophils Relative: 0 %
HCT: 42.9 % (ref 39.0–52.0)
Hemoglobin: 14.3 g/dL (ref 13.0–17.0)
Lymphocytes Relative: 15 %
Lymphs Abs: 0.5 10*3/uL — ABNORMAL LOW (ref 0.7–4.0)
MCH: 31.6 pg (ref 26.0–34.0)
MCHC: 33.3 g/dL (ref 30.0–36.0)
MCV: 94.9 fL (ref 78.0–100.0)
Monocytes Absolute: 0.5 10*3/uL (ref 0.1–1.0)
Monocytes Relative: 15 %
Neutro Abs: 2.3 10*3/uL (ref 1.7–7.7)
Neutrophils Relative %: 70 %
Platelets: 165 10*3/uL (ref 150–400)
RBC: 4.52 MIL/uL (ref 4.22–5.81)
RDW: 13.5 % (ref 11.5–15.5)
WBC: 3.4 10*3/uL — ABNORMAL LOW (ref 4.0–10.5)

## 2017-10-15 LAB — LACTATE DEHYDROGENASE: LDH: 154 U/L (ref 98–192)

## 2017-10-15 MED ORDER — ZOLEDRONIC ACID 4 MG/100ML IV SOLN
4.0000 mg | Freq: Once | INTRAVENOUS | Status: AC
  Administered 2017-10-15: 4 mg via INTRAVENOUS
  Filled 2017-10-15: qty 100

## 2017-10-15 MED ORDER — SODIUM CHLORIDE 0.9 % IV SOLN
Freq: Once | INTRAVENOUS | Status: AC
  Administered 2017-10-15: 14:00:00 via INTRAVENOUS

## 2017-10-16 LAB — PROTEIN ELECTROPHORESIS, SERUM
A/G Ratio: 1.1 (ref 0.7–1.7)
Albumin ELP: 3.1 g/dL (ref 2.9–4.4)
Alpha-1-Globulin: 0.3 g/dL (ref 0.0–0.4)
Alpha-2-Globulin: 0.9 g/dL (ref 0.4–1.0)
Beta Globulin: 1 g/dL (ref 0.7–1.3)
Gamma Globulin: 0.4 g/dL (ref 0.4–1.8)
Globulin, Total: 2.7 g/dL (ref 2.2–3.9)
M-Spike, %: 0.3 g/dL — ABNORMAL HIGH
Total Protein ELP: 5.8 g/dL — ABNORMAL LOW (ref 6.0–8.5)

## 2017-10-16 LAB — IMMUNOFIXATION ELECTROPHORESIS
IgA: 44 mg/dL — ABNORMAL LOW (ref 61–437)
IgG (Immunoglobin G), Serum: 477 mg/dL — ABNORMAL LOW (ref 700–1600)
IgM (Immunoglobulin M), Srm: 16 mg/dL (ref 15–143)
Total Protein ELP: 5.8 g/dL — ABNORMAL LOW (ref 6.0–8.5)

## 2017-10-19 ENCOUNTER — Encounter (HOSPITAL_COMMUNITY): Payer: Self-pay

## 2017-10-19 LAB — KAPPA/LAMBDA LIGHT CHAINS
Kappa free light chain: 21.9 mg/L — ABNORMAL HIGH (ref 3.3–19.4)
Kappa, lambda light chain ratio: 3.17 — ABNORMAL HIGH (ref 0.26–1.65)
Lambda free light chains: 6.9 mg/L (ref 5.7–26.3)

## 2017-10-19 NOTE — Patient Instructions (Signed)
[place] at [facility] Discharge Instructions  zometa given today. Follow up as scheduled.   Thank you for choosing [place] at [facility] to provide your oncology and hematology care.  To afford each patient quality time with our provider, please arrive at least 15 minutes before your scheduled appointment time.   If you have a lab appointment with the [facility] please come in thru the  Main Entrance and check in at the main information desk  You need to re-schedule your appointment should you arrive 10 or more minutes late.  We strive to give you quality time with our providers, and arriving late affects you and other patients whose appointments are after yours.  Also, if you no show three or more times for appointments you may be dismissed from the clinic at the providers discretion.     Again, thank you for choosing [facility].  Our hope is that these requests will decrease the amount of time that you wait before being seen by our physicians.       _____________________________________________________________  Should you have questions after your visit to [facility], please contact our office at [phone] between the hours of 8:00 a.m. and 4:30 p.m.  Voicemails left after 4:00 p.m. will not be returned until the following business day.  For prescription refill requests, have your pharmacy contact our office and allow 72 hours.    Cancer Center Support Programs:   > Cancer Support Group  2nd Tuesday of the month 1pm-2pm, Journey Room

## 2017-10-19 NOTE — Progress Notes (Signed)
Late entry from infusion encounter on sept. 12th.   Treatment given per orders. Patient tolerated it well without problems. Vitals stable and discharged home from clinic ambulatory. Follow up as scheduled.

## 2017-10-21 ENCOUNTER — Ambulatory Visit (HOSPITAL_COMMUNITY): Payer: Medicare Other

## 2017-10-21 ENCOUNTER — Other Ambulatory Visit (HOSPITAL_COMMUNITY): Payer: Medicare Other

## 2017-10-22 ENCOUNTER — Other Ambulatory Visit (HOSPITAL_COMMUNITY): Payer: Medicare Other

## 2017-10-22 ENCOUNTER — Ambulatory Visit (HOSPITAL_COMMUNITY): Payer: Medicare Other

## 2017-10-22 ENCOUNTER — Encounter: Payer: Self-pay | Admitting: Radiation Oncology

## 2017-10-22 NOTE — Progress Notes (Signed)
  Radiation Oncology         [phone]) [phone] ________________________________  Name: Dean [name]. MRN: [id number]  Date: 10/22/2017  DOB: May 08, 1935  End of Treatment Note  Diagnosis:   Recurrent Multiple Myeloma     Indication for treatment:  Palliative       Radiation treatment dates:   09/29/17 - 10/14/17  Site/dose:   Ribs, Left/ 30 Gy delivered in 10 fractions of 3 Gy  Beams/energy:   Isodose Plan/ 10X  Narrative: The patient tolerated radiation treatment relatively well. He reported severe fatigue and shortness of breath with activity, and denied any issues with swallowing or his skin throughout treatment. By the end of treatment, he reported coughing up clear phlegm and decrease in appetite. He noted improved pain to his mid-back with radiation. However, he continued to report pain to his lower back and upper hips, which MRI showed as unrelated to his myeloma.    Plan: The patient has completed radiation treatment. The patient will return to radiation oncology clinic for routine followup in one month. I advised them to call or return sooner if they have any questions or concerns related to their recovery or treatment.  -----------------------------------  Blair Promise, PhD, MD  This document serves as a record of services personally performed by Gery Pray, MD. It was created on his behalf by Wilburn Mylar, a trained medical scribe. The creation of this record is based on the scribe's personal observations and the provider's statements to them. This document has been checked and approved by the attending provider.

## 2017-10-28 ENCOUNTER — Other Ambulatory Visit (HOSPITAL_COMMUNITY): Payer: Medicare Other

## 2017-10-28 ENCOUNTER — Inpatient Hospital Stay (HOSPITAL_BASED_OUTPATIENT_CLINIC_OR_DEPARTMENT_OTHER): Payer: Medicare Other | Admitting: Hematology

## 2017-10-28 ENCOUNTER — Encounter (HOSPITAL_COMMUNITY): Payer: Self-pay | Admitting: Hematology

## 2017-10-28 ENCOUNTER — Inpatient Hospital Stay (HOSPITAL_COMMUNITY): Payer: Medicare Other

## 2017-10-28 VITALS — BP 132/71 | HR 101 | Temp 97.7°F | Resp 16 | Wt 239.4 lb

## 2017-10-28 VITALS — BP 121/56 | HR 59 | Temp 97.6°F | Resp 18

## 2017-10-28 DIAGNOSIS — Z923 Personal history of irradiation: Secondary | ICD-10-CM | POA: Diagnosis not present

## 2017-10-28 DIAGNOSIS — C9000 Multiple myeloma not having achieved remission: Secondary | ICD-10-CM

## 2017-10-28 DIAGNOSIS — R109 Unspecified abdominal pain: Secondary | ICD-10-CM

## 2017-10-28 DIAGNOSIS — Z5112 Encounter for antineoplastic immunotherapy: Secondary | ICD-10-CM | POA: Diagnosis not present

## 2017-10-28 DIAGNOSIS — M545 Low back pain: Secondary | ICD-10-CM

## 2017-10-28 DIAGNOSIS — G8929 Other chronic pain: Secondary | ICD-10-CM | POA: Diagnosis not present

## 2017-10-28 DIAGNOSIS — M25562 Pain in left knee: Secondary | ICD-10-CM | POA: Diagnosis not present

## 2017-10-28 DIAGNOSIS — M25552 Pain in left hip: Secondary | ICD-10-CM | POA: Diagnosis not present

## 2017-10-28 DIAGNOSIS — M5136 Other intervertebral disc degeneration, lumbar region: Secondary | ICD-10-CM | POA: Diagnosis not present

## 2017-10-28 DIAGNOSIS — G629 Polyneuropathy, unspecified: Secondary | ICD-10-CM | POA: Diagnosis not present

## 2017-10-28 LAB — COMPREHENSIVE METABOLIC PANEL
ALT: 17 U/L (ref 0–44)
AST: 18 U/L (ref 15–41)
Albumin: 3.8 g/dL (ref 3.5–5.0)
Alkaline Phosphatase: 24 U/L — ABNORMAL LOW (ref 38–126)
Anion gap: 10 (ref 5–15)
BUN: 24 mg/dL — ABNORMAL HIGH (ref 8–23)
CO2: 25 mmol/L (ref 22–32)
Calcium: 9.1 mg/dL (ref 8.9–10.3)
Chloride: 106 mmol/L (ref 98–111)
Creatinine, Ser: 0.78 mg/dL (ref 0.61–1.24)
GFR calc Af Amer: >60 mL/min (ref >60)
GFR calc non Af Amer: >60 mL/min (ref >60)
Glucose, Bld: 119 mg/dL — ABNORMAL HIGH (ref 70–99)
Potassium: 3.8 mmol/L (ref 3.5–5.1)
Sodium: 141 mmol/L (ref 135–145)
Total Bilirubin: 0.6 mg/dL (ref 0.3–1.2)
Total Protein: 6.6 g/dL (ref 6.5–8.1)

## 2017-10-28 LAB — CBC WITH DIFFERENTIAL/PLATELET
Basophils Absolute: 0 10*3/uL (ref 0.0–0.1)
Basophils Relative: 0 %
Eosinophils Absolute: 0 10*3/uL (ref 0.0–0.7)
Eosinophils Relative: 0 %
HCT: 44.2 % (ref 39.0–52.0)
Hemoglobin: 15.1 g/dL (ref 13.0–17.0)
Lymphocytes Relative: 13 %
Lymphs Abs: 0.6 10*3/uL — ABNORMAL LOW (ref 0.7–4.0)
MCH: 32 pg (ref 26.0–34.0)
MCHC: 34.2 g/dL (ref 30.0–36.0)
MCV: 93.6 fL (ref 78.0–100.0)
Monocytes Absolute: 1 10*3/uL (ref 0.1–1.0)
Monocytes Relative: 19 %
Neutro Abs: 3.4 10*3/uL (ref 1.7–7.7)
Neutrophils Relative %: 68 %
Platelets: 119 10*3/uL — ABNORMAL LOW (ref 150–400)
RBC: 4.72 MIL/uL (ref 4.22–5.81)
RDW: 13.3 % (ref 11.5–15.5)
WBC: 5 10*3/uL (ref 4.0–10.5)

## 2017-10-28 MED ORDER — PROCHLORPERAZINE MALEATE 10 MG PO TABS
10.0000 mg | ORAL_TABLET | Freq: Once | ORAL | Status: AC
  Administered 2017-10-28: 10 mg via ORAL
  Filled 2017-10-28: qty 1

## 2017-10-28 MED ORDER — OXYCODONE HCL 5 MG PO TABS
10.0000 mg | ORAL_TABLET | Freq: Once | ORAL | Status: AC
  Administered 2017-10-28: 10 mg via ORAL
  Filled 2017-10-28: qty 2

## 2017-10-28 MED ORDER — SODIUM CHLORIDE 0.9 % IV SOLN
20.0000 mg | Freq: Once | INTRAVENOUS | Status: AC
  Administered 2017-10-28: 20 mg via INTRAVENOUS
  Filled 2017-10-28: qty 2

## 2017-10-28 MED ORDER — DIPHENHYDRAMINE HCL 25 MG PO CAPS
50.0000 mg | ORAL_CAPSULE | Freq: Once | ORAL | Status: AC
  Administered 2017-10-28: 50 mg via ORAL
  Filled 2017-10-28: qty 2

## 2017-10-28 MED ORDER — OXYCODONE HCL 10 MG PO TABS: 10.0000 mg | ORAL_TABLET | Freq: Three times a day (TID) | ORAL | 0 refills | Status: DC | PRN

## 2017-10-28 MED ORDER — ACETAMINOPHEN 325 MG PO TABS
650.0000 mg | ORAL_TABLET | Freq: Once | ORAL | Status: AC
  Administered 2017-10-28: 650 mg via ORAL
  Filled 2017-10-28: qty 2

## 2017-10-28 MED ORDER — SODIUM CHLORIDE 0.9 % IV SOLN
1800.0000 mg | Freq: Once | INTRAVENOUS | Status: AC
  Administered 2017-10-28: 1800 mg via INTRAVENOUS
  Filled 2017-10-28: qty 80

## 2017-10-28 MED ORDER — HEPARIN SOD (PORK) LOCK FLUSH 100 UNIT/ML IV SOLN
500.0000 [IU] | Freq: Once | INTRAVENOUS | Status: AC | PRN
  Administered 2017-10-28: 500 [IU]

## 2017-10-28 MED ORDER — SODIUM CHLORIDE 0.9 % IV SOLN
Freq: Once | INTRAVENOUS | Status: AC
  Administered 2017-10-28: 10:00:00 via INTRAVENOUS

## 2017-10-28 NOTE — Patient Instructions (Signed)
[facility] at [facility] Discharge Instructions  Follow up in 2 weeks   Thank you for choosing [facility] at [facility] to provide your oncology and hematology care.  To afford each patient quality time with our provider, please arrive at least 15 minutes before your scheduled appointment time.   If you have a lab appointment with the Cancer Center please come in thru the  Main Entrance and check in at the main information desk  You need to re-schedule your appointment should you arrive 10 or more minutes late.  We strive to give you quality time with our providers, and arriving late affects you and other patients whose appointments are after yours.  Also, if you no show three or more times for appointments you may be dismissed from the clinic at the providers discretion.     Again, thank you for choosing [facility].  Our hope is that these requests will decrease the amount of time that you wait before being seen by our physicians.       _____________________________________________________________  Should you have questions after your visit to [facility], please contact our office at [phone] between the hours of 8:00 a.m. and 4:30 p.m.  Voicemails left after 4:00 p.m. will not be returned until the following business day.  For prescription refill requests, have your pharmacy contact our office and allow 72 hours.    Cancer Center Support Programs:   > Cancer Support Group  2nd Tuesday of the month 1pm-2pm, Journey Room    

## 2017-10-28 NOTE — Progress Notes (Signed)
Tolerated infusion w/o adverse reaction.  Alert, in no distress.  VSS.  Discharged ambulatory in c/o spouse.  

## 2017-10-28 NOTE — Assessment & Plan Note (Signed)
1.  IgG kappa multiple myeloma: - Initially treated with RVD from 04/07/2014 through 07/28/2014, placed on maintenance Revlimid, which was discontinued 1 and half year ago, unknown reasons, patient thinks it was discontinued when he was receiving radiation. -MRI in January of the brain showed new right temporal lesion, status post radiation therapy, PET/CT scan in January 2019 showed improvement of multiple myeloma with no new lesions, started on weekly Velcade and dexamethasone on 02/18/2017, status post 2 cycles, Velcade changed to every other week on 05/06/2017 secondary to neuropathy, takes dexamethasone 20 mg on days of Velcade. - We reviewed the results of the M spike which was stable at 0.3.  Free light chain ratio went up to 4. -Because of persistent back pain, I have done an MRI of the thoracic and lumbar spine.  We discussed the results of the scans today.  He had a new rib lesion close to the spine on the left side, possibly eighth rib.  There is a soft tissue mass in that area.  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  There is also chronic L4 compression fracture which is stable. - Velcade was discontinued secondary to progression.  I have recommended a much stronger regimen containing daratumumab, pomalidomide and dexamethasone.  Daratumumab will be given weekly for the first 8 weeks.  Pomalidomide will be started at a reduced dose of 2 mg, 21 days on, 7 days off.  Dexamethasone will be taken at a flat dose of 10 mg on a weekly basis.  We talked about the side effects of this regimen in detail. -He was started on first cycle of daratumumab, pomalidomide and dexamethasone on 09/17/2017.  He tolerated daratumumab very well.  However he got erythematous maculopapular rash involving the trunk for the last 2 days.  He received prednisone 60 mg for 5 days.  Rash improved after that.   -Status post XRT to the left eighth rib lesion, 30 Gy in 10 fractions from 09/29/2017 through  10/14/2017. - I have reviewed his labs today.  He will proceed with week 2 of daratumumab today.  I will continue to hold pomalidomide at this time.  He will be evaluated again in 2 weeks.  At that time we will decide whether to reintroduce pomalidomide. -I have reviewed his labs from 10/15/2017 which showed M spike of 0.3 g/dL.  Free light chain ratio is 3.17.  2.  Peripheral neuropathy: He has some numbness in the feet and legs.  He is continuing Neurontin 3 times a day.  3.  Bone protection: -He is receiving Zometa every 8 weeks.  Last Zometa was on 10/15/2017.  He will continue calcium and vitamin D.  4.  Back pain: -This is from combination of myeloma and degenerative disc disease.  He has bilateral flank pain and lower back pain.  He also has chronic left hip and knee pain. -He is currently taking oxycodone 10 mg every 15 hours.  I have told him to increase it to every 8 hours.

## 2017-10-28 NOTE — Progress Notes (Signed)
Dean [name], [place] [id number]   CLINIC:  Medical Oncology/Hematology  PCP:  Dean Frizzle, MD [address] [place] [address] [phone]   REASON FOR VISIT:  Follow-up for multiple myeloma  CURRENT THERAPY: dexamethasone, and daratumumab. Holding Pomalidomide due to rash  BRIEF ONCOLOGIC HISTORY:    Multiple myeloma without remission (Dean [name])   11/16/2012 Initial Diagnosis    L occipital condyle destructive lesion, not biopsied secondary to location. XRT    12/01/2012 Imaging    L3 superior endplate compression FX, no lytic lesions to suggest myeloma    02/06/2014 Imaging    soft tissue mass along the right posterior fifth rib with some rib destruction    02/16/2014 Miscellaneous    Normal CBC, Normal CMP, kappa,lamda ratio of 2.62 (abnormal), UIEP with slightly restricted band, IgG kappa, SPEP/IEP with monoclonal protein at 0.79 g/dl, normal igG, suppressed IGM    02/20/2014 Bone Marrow Biopsy    33% kappa restricted plasma cells Normal FISH, normal cytogenetics    02/21/2014 - 03/08/2014 Radiation Therapy    30Gy to R rib lesion, lesion not biopsied    03/16/2014 PET scan    Scattered hypermetabolic osseous lesions, including the calvarium, ribs, left scapula, sacrum, and left femoral shaft. An expansile left vertebral body lesion at L3 extends into the epidural space of the left lateral recess,     03/17/2014 - 04/07/2014 Chemotherapy    Velcade and Dexamethasone due to initial denial of Revlimid by insurance company.  Zometa monthly.    03/22/2014 Imaging    MR_ L-Spine- Enhancing lesions compatible with multiple myeloma at L2, L3, and S1-2.    04/07/2014 - 07/28/2014 Chemotherapy    RVD with Revlimid at 25 mg days 1-14.  Revlimid dose reduced to 25 mg every other day x 14 days with 7 day respite beginning on day 1 of cycle 2.    04/17/2014 Adverse Reaction    Revlimid-induced rash.  Treated with steroids.    07/28/2014 -  08/04/2014 Chemotherapy    Revlimid daily    08/04/2014 Adverse Reaction    Velcade-induced peripheral neuropathy    08/04/2014 Treatment Plan Change    D/C Velcade    08/11/2014 PET scan    Response to therapy. Improvement and resolution of foci of osseous hypermetabolism.    08/22/2014 - 08/28/2014 Chemotherapy    Revlimid 25 mg days 1-21 every 28 days    08/28/2014 Adverse Reaction    Revlimid-induced rash. Medication held.  Medrol dose Pak prescribed.    09/04/2014 - 01/08/2015 Chemotherapy    Revlimid 10 mg every other day, without dexamethasone    01/08/2015 - 01/15/2015 Hospital Admission    Colonic diverticular abscess with IR drain placement    03/02/2015 PET scan    Only bony uptake is low activity at site of deformity/callus at R second rib FX, high activity in sigmoid colon, advanced sigmoid divertidulosis. Abscess not resolved?    06/04/2015 Imaging    CT nonobstructive L renal calculus, diverticulosis of descending and sigmoid colon without inflammation, moderate prostatic enlargement    07/12/2015 Surgery    Diverticulitis s/p robotic sigmoid colectomy with Dr. Johney [name]    08/23/2015 PET scan    Primarily similar hypermetabolic osseous foci within the R sided ribs. new L third rib focus of hypermetab and sclerosis is favored to be related to healing FX. no soft tissue myeloma id'd. Presumed postop hypermetab and edema about the R pelvic  wall     03/06/2016 PET scan    1. Reduced activity in the prior lesion such as the right second and fifth rib lesions, activity nearly completely resolved and significantly less than added mediastinal blood pool activity. No new lesions are identified. 2. Other imaging findings of potential clinical significance: Coronary, aortic arch, and branch vessel atherosclerotic vascular disease. Aortoiliac atherosclerotic vascular disease. Enlarged prostate gland. Colonic diverticula.    02/17/2017 PET scan    HEAD/NECK: No hypermetabolic activity in the  scalp. No hypermetabolic cervical lymph nodes. CHEST: No hypermetabolic mediastinal or hilar nodes. Right upper lobe scarring/atelectasis. No suspicious pulmonary nodules on the CT scan. ABDOMEN/PELVIS: No abnormal hypermetabolic activity within the liver, pancreas, adrenal glands, or spleen. No hypermetabolic lymph nodes in the abdomen or pelvis. Atherosclerotic calcifications of the abdominal aorta and branch vessels. Left renal sinus cysts. Sigmoid diverticulosis, without evidence of diverticulitis. Prostatomegaly. SKELETON: Vague hypermetabolism involving the inferior sternum, max SUV 3.2, previously 8.2. Focal hypermetabolism involving the left sacrum, max SUV 4.0, previously 6.7. EXTREMITIES: No abnormal hypermetabolic activity in the lower extremities.    02/18/2017 -  Chemotherapy    Bortezomib 1.60m/m2 QWk + Dexamethasone 163mQTue/Fri --Cycle #1, 02/18/17     02/18/2017 - 08/26/2017 Chemotherapy    The patient had bortezomib SQ (VELCADE) chemo injection 3 mg, 1.3 mg/m2 = 3 mg, Subcutaneous,  Once, 7 of 9 cycles Administration: 3 mg (02/18/2017), 3 mg (02/26/2017), 3 mg (03/04/2017), 3 mg (03/11/2017), 3 mg (04/08/2017), 3 mg (03/18/2017), 3 mg (03/25/2017), 3 mg (04/01/2017), 3 mg (05/06/2017), 3 mg (05/20/2017), 3 mg (06/03/2017), 3 mg (06/17/2017), 3 mg (07/01/2017), 3 mg (07/15/2017), 3 mg (07/29/2017), 3 mg (08/12/2017), 3 mg (08/26/2017)  for chemotherapy treatment.     09/14/2017 -  Chemotherapy    The patient had daratumumab (DARZALEX) 1,800 mg in sodium chloride 0.9 % 910 mL (1.8 mg/mL) chemo infusion, 15.9 mg/kg = 1,820 mg, Intravenous, Once, 1 of 1 cycle Administration: 1,800 mg (09/17/2017) daratumumab (DARZALEX) 1,800 mg in sodium chloride 0.9 % 410 mL (3.6 mg/mL) chemo infusion, 1,820 mg, Intravenous, Once, 1 of 7 cycles Administration: 1,800 mg (10/28/2017)  for chemotherapy treatment.       INTERVAL HISTORY:  [name].o. male returns for routine follow-up for multiple myeloma and  consideration for next cycle of chemotherapy. Patient has been having increase pain. It is mostly in his lower back and hip. He is unable to sleep at night due to pain and night sweats. He finished radiation on 10/14/17 and states it didn't improve his pain. His appetite and energy level are at 25%. He is maintaining his weight at this time. Patient denies any numbness or tingling in hands or feet. Denies any nausea, vomiting, or diarrhea.        REVIEW OF SYSTEMS:  Review of Systems  Constitutional: Positive for chills and fatigue.       Sweats  Gastrointestinal: Positive for constipation.  Neurological: Positive for extremity weakness and numbness (legs).  Hematological: Bruises/bleeds easily.  All other systems reviewed and are negative.    PAST MEDICAL/SURGICAL HISTORY:  Past Medical History:  Diagnosis Date  . BPH (benign prostatic hyperplasia)   . Colonic diverticular abscess 01/08/2015  . Diverticulitis 1109/47/09 complicated by abscess and required percutaneous drainage  . GERD (gastroesophageal reflux disease)   . H/O ETOH abuse   . History of chemotherapy last done jan 2017  . History of radiation therapy 01/05/13-02/10/13   45 gray to left occipital  condyle region  . History of radiation therapy 12/01/16-12/10/16   Parasternal nodule, chest- 24 Gy total delivered in 8 fractions, Left sacro-iliac, pelvis- 24 Gy total delivered in 8 fractions   . History of radiation therapy 02/19/17-02/22/17   right temporal scalp 30 Gy in 10 fractions  . Intra-abdominal abscess ([place])   . Multiple myeloma ([place]) 2015  . Neuropathy   . Radiation 02/21/14-03/08/14   right posterior chest wall area 30 gray  . Radiation 04/19/14-05/02/14   lumbar spine 25 gray  . Skull lesion    Left occipital condyle  . Wrist fracture, left    x 2   Past Surgical History:  Procedure Laterality Date  . COLONOSCOPY N/A 04/19/2015   Procedure: COLONOSCOPY;  Surgeon: Danie Binder, MD;  Location: [facility];   Service: Endoscopy;  Laterality: N/A;  1:30 PM  . CYST EXCISION  1959   tail bone  . IR IMAGING GUIDED PORT INSERTION  09/25/2017     SOCIAL HISTORY:  Social History   Socioeconomic History  . Marital status: Married    Spouse name: Not on file  . Number of children: 3  . Years of education: Not on file  . Highest education level: Not on file  Occupational History    Employer: RETIRED  Social Needs  . Financial resource strain: Not on file  . Food insecurity:    Worry: Not on file    Inability: Not on file  . Transportation needs:    Medical: Not on file    Non-medical: Not on file  Tobacco Use  . Smoking status: Never Smoker  . Smokeless tobacco: Never Used  Substance and Sexual Activity  . Alcohol use: No    Comment: " Not much no more"  . Drug use: No  . Sexual activity: Not on file  Lifestyle  . Physical activity:    Days per week: Not on file    Minutes per session: Not on file  . Stress: Not on file  Relationships  . Social connections:    Talks on phone: Not on file    Gets together: Not on file    Attends religious service: Not on file    Active member of club or organization: Not on file    Attends meetings of clubs or organizations: Not on file    Relationship status: Not on file  . Intimate partner violence:    Fear of current or ex partner: Not on file    Emotionally abused: Not on file    Physically abused: Not on file    Forced sexual activity: Not on file  Other Topics Concern  . Not on file  Social History Narrative  . Not on file    FAMILY HISTORY:  Family History  Problem Relation Age of Onset  . Diabetes Mother   . Stroke Mother   . Hypertension Father     CURRENT MEDICATIONS:  Outpatient Encounter Medications as of 10/28/2017  Medication Sig Note  . acyclovir (ZOVIRAX) 400 MG tablet Take 1 tablet (400 mg total) by mouth 2 (two) times daily.   Marland Kitchen ALPRAZolam (XANAX) 1 MG tablet TAKE 2 TABLETS BY MOUTH AT BEDTIME AS NEEDED FOR  ANXIETY AND OR SLEEP. (Patient taking differently: Take 0.5-2 mg by mouth at bedtime as needed for anxiety or sleep. )   . aspirin EC 325 MG tablet Take 1 tablet (325 mg total) by mouth daily.   Marland Kitchen CALCIUM-VITAMIN D PO Take 1 tablet by  mouth 2 (two) times daily.    Marland Kitchen dexamethasone (DECADRON) 4 MG tablet Take 2.5 tablets (10 mg total) by mouth once a week. Take 10 mg once weekly 09/18/2017: Started on 09/18/17  . furosemide (LASIX) 20 MG tablet Take 1 tablet (20 mg total) by mouth daily as needed for edema.   . gabapentin (NEURONTIN) 400 MG capsule TAKE 5 CAPSULES BY MOUTH DAILY (Patient taking differently: Take 400 mg by mouth 2 (two) times daily. )   . HYDROcodone-acetaminophen (NORCO/VICODIN) 5-325 MG tablet TAKE 1 TABLET BY MOUTH EVERY 6 HOURS AS NEEDED FOR MODERATE PAIN   . lidocaine-prilocaine (EMLA) cream Apply small amount over port one (1) hour prior to appointment.   Marland Kitchen MAGNESIUM PO Take 1 tablet by mouth at bedtime.   Marland Kitchen omeprazole (PRILOSEC) 20 MG capsule Take 1 capsule (20 mg total) by mouth daily. (Patient taking differently: Take 20-40 mg by mouth daily as needed (heartburn). )   . Oxycodone HCl 10 MG TABS Take 1 tablet (10 mg total) by mouth every 8 (eight) hours as needed.   . pomalidomide (POMALYST) 2 MG capsule Take 1 capsule (2 mg total) by mouth daily. Take with water on days 1-21. Repeat every 28 days.   . predniSONE (DELTASONE) 20 MG tablet Take 20 mg by mouth daily with breakfast. Take 60 mg by mouth for 5 days. 09/24/2017: Taking 60 mg by mouth for 5 days  . tamsulosin (FLOMAX) 0.4 MG CAPS capsule Take 1 capsule (0.4 mg total) by mouth at bedtime.   Marland Kitchen zolendronic acid (ZOMETA) 4 MG/5ML injection Inject 4 mg into the vein every 30 (thirty) days.    . [DISCONTINUED] oxyCODONE 10 MG TABS Take 1 tablet (10 mg total) by mouth every 8 (eight) hours as needed for severe pain.    No facility-administered encounter medications on file as of 10/28/2017.     ALLERGIES:  Allergies    Allergen Reactions  . Codeine Other (See Comments)    Headache  . Morphine And Related Other (See Comments)    Makes him feel weird  . Revlimid [Lenalidomide] Other (See Comments)    "Causes me to become weak"     PHYSICAL EXAM:  ECOG Performance status: 1  Vitals:   10/28/17 0842  BP: 132/71  Pulse: (!) 101  Resp: 16  Temp: 97.7 F (36.5 C)  SpO2: 96%   Filed Weights   10/28/17 0842  Weight: 239 lb 6.4 oz (108.6 kg)    Physical Exam  Constitutional: He is oriented to person, place, and time. He appears well-developed and well-nourished.  Cardiovascular: Normal rate, regular rhythm and normal heart sounds.  Pulmonary/Chest: Effort normal and breath sounds normal.  Neurological: He is alert and oriented to person, place, and time.  Skin: Skin is warm and dry.  Psychiatric: He has a normal mood and affect. His behavior is normal. Judgment and thought content normal.     LABORATORY DATA:  I have reviewed the labs as listed.  CBC    Component Value Date/Time   WBC 5.0 10/28/2017 0805   RBC 4.72 10/28/2017 0805   HGB 15.1 10/28/2017 0805   HCT 44.2 10/28/2017 0805   PLT 119 (L) 10/28/2017 0805   MCV 93.6 10/28/2017 0805   MCH 32.0 10/28/2017 0805   MCHC 34.2 10/28/2017 0805   RDW 13.3 10/28/2017 0805   LYMPHSABS 0.6 (L) 10/28/2017 0805   MONOABS 1.0 10/28/2017 0805   EOSABS 0.0 10/28/2017 0805   BASOSABS 0.0 10/28/2017 0805  CMP Latest Ref Rng & Units 10/28/2017 10/15/2017 09/25/2017  Glucose 70 - 99 mg/dL 119(H) 103(H) 101(H)  BUN 8 - 23 mg/dL 24(H) 18 17  Creatinine 0.61 - 1.24 mg/dL 0.78 0.92 0.72  Sodium 135 - 145 mmol/L 141 141 140  Potassium 3.5 - 5.1 mmol/L 3.8 4.4 3.7  Chloride 98 - 111 mmol/L 106 110 111  CO2 22 - 32 mmol/L 25 24 23   Calcium 8.9 - 10.3 mg/dL 9.1 9.2 8.4(L)  Total Protein 6.5 - 8.1 g/dL 6.6 6.7 -  Total Bilirubin 0.3 - 1.2 mg/dL 0.6 0.6 -  Alkaline Phos 38 - 126 U/L 24(L) 25(L) -  AST 15 - 41 U/L 18 17 -  ALT 0 - 44 U/L 17 16 -           ASSESSMENT & PLAN:   Multiple myeloma without remission ([facility]) 1.  IgG kappa multiple myeloma: - Initially treated with RVD from 04/07/2014 through 07/28/2014, placed on maintenance Revlimid, which was discontinued 1 and half year ago, unknown reasons, patient thinks it was discontinued when he was receiving radiation. -MRI in January of the brain showed new right temporal lesion, status post radiation therapy, PET/CT scan in January 2019 showed improvement of multiple myeloma with no new lesions, started on weekly Velcade and dexamethasone on 02/18/2017, status post 2 cycles, Velcade changed to every other week on 05/06/2017 secondary to neuropathy, takes dexamethasone 20 mg on days of Velcade. - We reviewed the results of the M spike which was stable at 0.3.  Free light chain ratio went up to 4. -Because of persistent back pain, I have done an MRI of the thoracic and lumbar spine.  We discussed the results of the scans today.  He had a new rib lesion close to the spine on the left side, possibly eighth rib.  There is a soft tissue mass in that area.  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  There is also chronic L4 compression fracture which is stable. - Velcade was discontinued secondary to progression.  I have recommended a much stronger regimen containing daratumumab, pomalidomide and dexamethasone.  Daratumumab will be given weekly for the first 8 weeks.  Pomalidomide will be started at a reduced dose of 2 mg, 21 days on, 7 days off.  Dexamethasone will be taken at a flat dose of 10 mg on a weekly basis.  We talked about the side effects of this regimen in detail. -He was started on first cycle of daratumumab, pomalidomide and dexamethasone on 09/17/2017.  He tolerated daratumumab very well.  However he got erythematous maculopapular rash involving the trunk for the last 2 days.  He received prednisone 60 mg for 5 days.  Rash improved after that.   -Status post XRT  to the left eighth rib lesion, 30 Gy in 10 fractions from 09/29/2017 through 10/14/2017. - I have reviewed his labs today.  He will proceed with week 2 of daratumumab today.  I will continue to hold pomalidomide at this time.  He will be evaluated again in 2 weeks.  At that time we will decide whether to reintroduce pomalidomide. -I have reviewed his labs from 10/15/2017 which showed M spike of 0.3 g/dL.  Free light chain ratio is 3.17.  2.  Peripheral neuropathy: He has some numbness in the feet and legs.  He is continuing Neurontin 3 times a day.  3.  Bone protection: -He is receiving Zometa every 8 weeks.  Last Zometa was on 10/15/2017.  He will continue calcium and vitamin D.  4.  Back pain: -This is from combination of myeloma and degenerative disc disease.  He has bilateral flank pain and lower back pain.  He also has chronic left hip and knee pain. -He is currently taking oxycodone 10 mg every 15 hours.  I have told him to increase it to every 8 hours.      Orders placed this encounter:  Orders Placed This Encounter  Procedures  . CBC with Differential/Platelet  . Comprehensive metabolic panel  . Lactate dehydrogenase      Derek Jack, MD [place] [phone]

## 2017-10-29 ENCOUNTER — Ambulatory Visit (HOSPITAL_COMMUNITY): Payer: Medicare Other

## 2017-10-29 ENCOUNTER — Other Ambulatory Visit (HOSPITAL_COMMUNITY): Payer: Medicare Other

## 2017-11-04 ENCOUNTER — Encounter (HOSPITAL_COMMUNITY): Payer: Self-pay

## 2017-11-04 ENCOUNTER — Inpatient Hospital Stay (HOSPITAL_COMMUNITY): Payer: Medicare Other | Attending: Hematology

## 2017-11-04 VITALS — BP 110/37 | HR 52 | Temp 97.6°F | Resp 16 | Wt 242.2 lb

## 2017-11-04 DIAGNOSIS — C9000 Multiple myeloma not having achieved remission: Secondary | ICD-10-CM | POA: Insufficient documentation

## 2017-11-04 DIAGNOSIS — Z5112 Encounter for antineoplastic immunotherapy: Secondary | ICD-10-CM | POA: Diagnosis not present

## 2017-11-04 LAB — COMPREHENSIVE METABOLIC PANEL
ALT: 14 U/L (ref 0–44)
AST: 19 U/L (ref 15–41)
Albumin: 3.4 g/dL — ABNORMAL LOW (ref 3.5–5.0)
Alkaline Phosphatase: 24 U/L — ABNORMAL LOW (ref 38–126)
Anion gap: 10 (ref 5–15)
BUN: 18 mg/dL (ref 8–23)
CO2: 23 mmol/L (ref 22–32)
Calcium: 8.4 mg/dL — ABNORMAL LOW (ref 8.9–10.3)
Chloride: 106 mmol/L (ref 98–111)
Creatinine, Ser: 0.92 mg/dL (ref 0.61–1.24)
GFR calc Af Amer: >60 mL/min (ref >60)
GFR calc non Af Amer: >60 mL/min (ref >60)
Glucose, Bld: 107 mg/dL — ABNORMAL HIGH (ref 70–99)
Potassium: 4 mmol/L (ref 3.5–5.1)
Sodium: 139 mmol/L (ref 135–145)
Total Bilirubin: 0.8 mg/dL (ref 0.3–1.2)
Total Protein: 5.9 g/dL — ABNORMAL LOW (ref 6.5–8.1)

## 2017-11-04 LAB — CBC WITH DIFFERENTIAL/PLATELET
Basophils Absolute: 0 10*3/uL (ref 0.0–0.1)
Basophils Relative: 0 %
Eosinophils Absolute: 0 10*3/uL (ref 0.0–0.7)
Eosinophils Relative: 1 %
HCT: 42.2 % (ref 39.0–52.0)
Hemoglobin: 14.3 g/dL (ref 13.0–17.0)
Lymphocytes Relative: 20 %
Lymphs Abs: 0.8 10*3/uL (ref 0.7–4.0)
MCH: 32.1 pg (ref 26.0–34.0)
MCHC: 33.9 g/dL (ref 30.0–36.0)
MCV: 94.8 fL (ref 78.0–100.0)
Monocytes Absolute: 0.5 10*3/uL (ref 0.1–1.0)
Monocytes Relative: 13 %
Neutro Abs: 2.5 10*3/uL (ref 1.7–7.7)
Neutrophils Relative %: 66 %
Platelets: 76 10*3/uL — ABNORMAL LOW (ref 150–400)
RBC: 4.45 MIL/uL (ref 4.22–5.81)
RDW: 13.5 % (ref 11.5–15.5)
WBC: 3.8 10*3/uL — ABNORMAL LOW (ref 4.0–10.5)

## 2017-11-04 MED ORDER — PROCHLORPERAZINE MALEATE 10 MG PO TABS
10.0000 mg | ORAL_TABLET | Freq: Once | ORAL | Status: AC
  Administered 2017-11-04: 10 mg via ORAL
  Filled 2017-11-04: qty 1

## 2017-11-04 MED ORDER — SODIUM CHLORIDE 0.9 % IV SOLN
1800.0000 mg | Freq: Once | INTRAVENOUS | Status: AC
  Administered 2017-11-04: 1800 mg via INTRAVENOUS
  Filled 2017-11-04: qty 80

## 2017-11-04 MED ORDER — SODIUM CHLORIDE 0.9 % IV SOLN
Freq: Once | INTRAVENOUS | Status: AC
  Administered 2017-11-04: 09:00:00 via INTRAVENOUS

## 2017-11-04 MED ORDER — SODIUM CHLORIDE 0.9 % IV SOLN
20.0000 mg | Freq: Once | INTRAVENOUS | Status: AC
  Administered 2017-11-04: 20 mg via INTRAVENOUS
  Filled 2017-11-04: qty 2

## 2017-11-04 MED ORDER — SODIUM CHLORIDE 0.9% FLUSH
10.0000 mL | INTRAVENOUS | Status: DC | PRN
  Administered 2017-11-04: 10 mL
  Filled 2017-11-04: qty 10

## 2017-11-04 MED ORDER — ACETAMINOPHEN 325 MG PO TABS
650.0000 mg | ORAL_TABLET | Freq: Once | ORAL | Status: AC
  Administered 2017-11-04: 650 mg via ORAL
  Filled 2017-11-04: qty 2

## 2017-11-04 MED ORDER — DIPHENHYDRAMINE HCL 25 MG PO CAPS
50.0000 mg | ORAL_CAPSULE | Freq: Once | ORAL | Status: AC
  Administered 2017-11-04: 50 mg via ORAL
  Filled 2017-11-04: qty 2

## 2017-11-04 MED ORDER — HEPARIN SOD (PORK) LOCK FLUSH 100 UNIT/ML IV SOLN
500.0000 [IU] | Freq: Once | INTRAVENOUS | Status: AC | PRN
  Administered 2017-11-04: 500 [IU]
  Filled 2017-11-04 (×2): qty 5

## 2017-11-04 NOTE — Progress Notes (Signed)
1194 Labs, including Platelets of 76, reviewed with Dr. Delton Coombes and pt approved for Darzalex infusion today per MD                                               Dean Petersontolerated Darzalex infusion well without complaints or incident. VSS upon discharge. Pt discharged self ambulatory using cane in satisfactory condition accompanied by his wife

## 2017-11-04 NOTE — Patient Instructions (Signed)
[facility] Discharge Instructions for Patients Receiving Chemotherapy   Beginning January 23rd 2017 lab work for the Cancer Center will be done in the  Main lab at [place] on 1st floor. If you have a lab appointment with the Cancer Center please come in thru the  Main Entrance and check in at the main information desk   Today you received the following chemotherapy agents Darzalex. Follow-up as scheduled. Call clinic for any questions or concerns  To help prevent nausea and vomiting after your treatment, we encourage you to take your nausea medication   If you develop nausea and vomiting, or diarrhea that is not controlled by your medication, call the clinic.  The clinic phone number is [phone]. Office hours are Monday-Friday 8:30am-5:00pm.  BELOW ARE SYMPTOMS THAT SHOULD BE REPORTED IMMEDIATELY:  *FEVER GREATER THAN 101.0 F  *CHILLS WITH OR WITHOUT FEVER  NAUSEA AND VOMITING THAT IS NOT CONTROLLED WITH YOUR NAUSEA MEDICATION  *UNUSUAL SHORTNESS OF BREATH  *UNUSUAL BRUISING OR BLEEDING  TENDERNESS IN MOUTH AND THROAT WITH OR WITHOUT PRESENCE OF ULCERS  *URINARY PROBLEMS  *BOWEL PROBLEMS  UNUSUAL RASH Items with * indicate a potential emergency and should be followed up as soon as possible. If you have an emergency after office hours please contact your primary care physician or go to the nearest emergency department.  Please call the clinic during office hours if you have any questions or concerns.   You may also contact the Patient Navigator at [phone] should you have any questions or need assistance in obtaining follow up care.      Resources For Cancer Patients and their Caregivers ? American [employer]: Can assist with transportation, wigs, general needs, runs Look Good Feel Better.        1-888-227-6333 ? Cancer Care: Provides financial assistance, online support groups, medication/co-pay assistance.  1-800-813-HOPE  (4673) ? [facility] Assists [place] cancer patients and their families through emotional , educational and financial support.  336-427-4357 ? [place] DSS Where to apply for food stamps, Medicaid and utility assistance. 336-342-1394 ? [facility]: Transportation to medical appointments. 336-347-2287 ? Social Security Administration: May apply for disability if have a Stage IV cancer. 336-342-7796 1-800-772-1213 ? [place] Aging, Disability and Transit Services: Assists with nutrition, care and transit needs. 336-349-2343         

## 2017-11-05 ENCOUNTER — Other Ambulatory Visit (HOSPITAL_COMMUNITY): Payer: Medicare Other

## 2017-11-05 ENCOUNTER — Ambulatory Visit (HOSPITAL_COMMUNITY): Payer: Medicare Other

## 2017-11-11 ENCOUNTER — Inpatient Hospital Stay (HOSPITAL_COMMUNITY): Payer: Medicare Other

## 2017-11-11 ENCOUNTER — Encounter (HOSPITAL_COMMUNITY): Payer: Self-pay | Admitting: Hematology

## 2017-11-11 ENCOUNTER — Inpatient Hospital Stay (HOSPITAL_BASED_OUTPATIENT_CLINIC_OR_DEPARTMENT_OTHER): Payer: Medicare Other | Admitting: Hematology

## 2017-11-11 VITALS — BP 122/54 | HR 68 | Temp 97.8°F | Resp 18

## 2017-11-11 VITALS — BP 134/82 | HR 82 | Temp 97.2°F | Resp 20 | Wt 238.0 lb

## 2017-11-11 DIAGNOSIS — M545 Low back pain: Secondary | ICD-10-CM

## 2017-11-11 DIAGNOSIS — G4700 Insomnia, unspecified: Secondary | ICD-10-CM | POA: Diagnosis not present

## 2017-11-11 DIAGNOSIS — C9000 Multiple myeloma not having achieved remission: Secondary | ICD-10-CM

## 2017-11-11 DIAGNOSIS — G479 Sleep disorder, unspecified: Secondary | ICD-10-CM

## 2017-11-11 DIAGNOSIS — M25552 Pain in left hip: Secondary | ICD-10-CM | POA: Diagnosis not present

## 2017-11-11 DIAGNOSIS — G8929 Other chronic pain: Secondary | ICD-10-CM | POA: Diagnosis not present

## 2017-11-11 DIAGNOSIS — M25562 Pain in left knee: Secondary | ICD-10-CM | POA: Diagnosis not present

## 2017-11-11 DIAGNOSIS — Z5112 Encounter for antineoplastic immunotherapy: Secondary | ICD-10-CM | POA: Diagnosis not present

## 2017-11-11 DIAGNOSIS — G629 Polyneuropathy, unspecified: Secondary | ICD-10-CM

## 2017-11-11 DIAGNOSIS — K219 Gastro-esophageal reflux disease without esophagitis: Secondary | ICD-10-CM

## 2017-11-11 DIAGNOSIS — M5136 Other intervertebral disc degeneration, lumbar region: Secondary | ICD-10-CM | POA: Diagnosis not present

## 2017-11-11 LAB — COMPREHENSIVE METABOLIC PANEL
ALT: 16 U/L (ref 0–44)
AST: 15 U/L (ref 15–41)
Albumin: 3.5 g/dL (ref 3.5–5.0)
Alkaline Phosphatase: 35 U/L — ABNORMAL LOW (ref 38–126)
Anion gap: 8 (ref 5–15)
BUN: 20 mg/dL (ref 8–23)
CO2: 23 mmol/L (ref 22–32)
Calcium: 8.9 mg/dL (ref 8.9–10.3)
Chloride: 108 mmol/L (ref 98–111)
Creatinine, Ser: 0.79 mg/dL (ref 0.61–1.24)
GFR calc Af Amer: >60 mL/min (ref >60)
GFR calc non Af Amer: >60 mL/min (ref >60)
Glucose, Bld: 113 mg/dL — ABNORMAL HIGH (ref 70–99)
Potassium: 4.4 mmol/L (ref 3.5–5.1)
Sodium: 139 mmol/L (ref 135–145)
Total Bilirubin: 0.5 mg/dL (ref 0.3–1.2)
Total Protein: 6.1 g/dL — ABNORMAL LOW (ref 6.5–8.1)

## 2017-11-11 LAB — CBC WITH DIFFERENTIAL/PLATELET
Abs Immature Granulocytes: 0.01 10*3/uL (ref 0.00–0.07)
Basophils Absolute: 0 10*3/uL (ref 0.0–0.1)
Basophils Relative: 0 %
Eosinophils Absolute: 0 10*3/uL (ref 0.0–0.5)
Eosinophils Relative: 0 %
HCT: 43.9 % (ref 39.0–52.0)
Hemoglobin: 14.2 g/dL (ref 13.0–17.0)
Immature Granulocytes: 0 %
Lymphocytes Relative: 15 %
Lymphs Abs: 0.7 10*3/uL (ref 0.7–4.0)
MCH: 30.3 pg (ref 26.0–34.0)
MCHC: 32.3 g/dL (ref 30.0–36.0)
MCV: 93.6 fL (ref 80.0–100.0)
Monocytes Absolute: 0.5 10*3/uL (ref 0.1–1.0)
Monocytes Relative: 9 %
Neutro Abs: 3.7 10*3/uL (ref 1.7–7.7)
Neutrophils Relative %: 76 %
Platelets: 88 10*3/uL — ABNORMAL LOW (ref 150–400)
RBC: 4.69 MIL/uL (ref 4.22–5.81)
RDW: 13.2 % (ref 11.5–15.5)
WBC: 4.9 10*3/uL (ref 4.0–10.5)
nRBC: 0 % (ref 0.0–0.2)

## 2017-11-11 LAB — LACTATE DEHYDROGENASE: LDH: 143 U/L (ref 98–192)

## 2017-11-11 MED ORDER — SODIUM CHLORIDE 0.9 % IV SOLN
20.0000 mg | Freq: Once | INTRAVENOUS | Status: AC
  Administered 2017-11-11: 20 mg via INTRAVENOUS
  Filled 2017-11-11: qty 2

## 2017-11-11 MED ORDER — TEMAZEPAM 15 MG PO CAPS: 15.0000 mg | ORAL_CAPSULE | Freq: Every evening | ORAL | 0 refills | Status: DC | PRN

## 2017-11-11 MED ORDER — SODIUM CHLORIDE 0.9 % IV SOLN
Freq: Once | INTRAVENOUS | Status: AC
  Administered 2017-11-11: 11:00:00 via INTRAVENOUS

## 2017-11-11 MED ORDER — ACETAMINOPHEN 325 MG PO TABS
650.0000 mg | ORAL_TABLET | Freq: Once | ORAL | Status: AC
  Administered 2017-11-11: 650 mg via ORAL
  Filled 2017-11-11: qty 2

## 2017-11-11 MED ORDER — PROCHLORPERAZINE MALEATE 10 MG PO TABS
10.0000 mg | ORAL_TABLET | Freq: Once | ORAL | Status: AC
  Administered 2017-11-11: 10 mg via ORAL
  Filled 2017-11-11: qty 1

## 2017-11-11 MED ORDER — HEPARIN SOD (PORK) LOCK FLUSH 100 UNIT/ML IV SOLN
500.0000 [IU] | Freq: Once | INTRAVENOUS | Status: AC | PRN
  Administered 2017-11-11: 500 [IU]

## 2017-11-11 MED ORDER — OMEPRAZOLE 20 MG PO CPDR: 20.0000 mg | DELAYED_RELEASE_CAPSULE | Freq: Every day | ORAL | 5 refills | Status: DC

## 2017-11-11 MED ORDER — SODIUM CHLORIDE 0.9 % IV SOLN
15.8000 mg/kg | Freq: Once | INTRAVENOUS | Status: AC
  Administered 2017-11-11: 1800 mg via INTRAVENOUS
  Filled 2017-11-11: qty 80

## 2017-11-11 MED ORDER — DIPHENHYDRAMINE HCL 25 MG PO CAPS
50.0000 mg | ORAL_CAPSULE | Freq: Once | ORAL | Status: AC
  Administered 2017-11-11: 50 mg via ORAL
  Filled 2017-11-11: qty 2

## 2017-11-11 NOTE — Progress Notes (Signed)
[place], [place] [address]   CLINIC:  Medical Oncology/Hematology  PCP:  [name], MD [address] [place] [address] [phone]   REASON FOR VISIT: Follow-up for multiple myeloma  CURRENT THERAPY: Starting pomalidomide every other day, daratumumab (DARZALEX), and dexamethasone  BRIEF ONCOLOGIC HISTORY:    Multiple myeloma without remission ([place])   11/16/2012 Initial Diagnosis    L occipital condyle destructive lesion, not biopsied secondary to location. XRT    12/01/2012 Imaging    L3 superior endplate compression FX, no lytic lesions to suggest myeloma    02/06/2014 Imaging    soft tissue mass along the right posterior fifth rib with some rib destruction    02/16/2014 Miscellaneous    Normal CBC, Normal CMP, kappa,lamda ratio of 2.62 (abnormal), UIEP with slightly restricted band, IgG kappa, SPEP/IEP with monoclonal protein at 0.79 g/dl, normal igG, suppressed IGM    02/20/2014 Bone Marrow Biopsy    33% kappa restricted plasma cells Normal FISH, normal cytogenetics    02/21/2014 - 03/08/2014 Radiation Therapy    30Gy to R rib lesion, lesion not biopsied    03/16/2014 PET scan    Scattered hypermetabolic osseous lesions, including the calvarium, ribs, left scapula, sacrum, and left femoral shaft. An expansile left vertebral body lesion at L3 extends into the epidural space of the left lateral recess,     03/17/2014 - 04/07/2014 Chemotherapy    Velcade and Dexamethasone due to initial denial of Revlimid by insurance company.  Zometa monthly.    03/22/2014 Imaging    MR_ L-Spine- Enhancing lesions compatible with multiple myeloma at L2, L3, and S1-2.    04/07/2014 - 07/28/2014 Chemotherapy    RVD with Revlimid at 25 mg days 1-14.  Revlimid dose reduced to 25 mg every other day x 14 days with 7 day respite beginning on day 1 of cycle 2.    04/17/2014 Adverse Reaction    Revlimid-induced rash.  Treated with steroids.    07/28/2014 - 08/04/2014 Chemotherapy    Revlimid daily    08/04/2014 Adverse Reaction    Velcade-induced peripheral neuropathy    08/04/2014 Treatment Plan Change    D/C Velcade    08/11/2014 PET scan    Response to therapy. Improvement and resolution of foci of osseous hypermetabolism.    08/22/2014 - 08/28/2014 Chemotherapy    Revlimid 25 mg days 1-21 every 28 days    08/28/2014 Adverse Reaction    Revlimid-induced rash. Medication held.  Medrol dose Pak prescribed.    09/04/2014 - 01/08/2015 Chemotherapy    Revlimid 10 mg every other day, without dexamethasone    01/08/2015 - 01/15/2015 Hospital Admission    Colonic diverticular abscess with IR drain placement    03/02/2015 PET scan    Only bony uptake is low activity at site of deformity/callus at R second rib FX, high activity in sigmoid colon, advanced sigmoid divertidulosis. Abscess not resolved?    06/04/2015 Imaging    CT nonobstructive L renal calculus, diverticulosis of descending and sigmoid colon without inflammation, moderate prostatic enlargement    07/12/2015 Surgery    Diverticulitis s/p robotic sigmoid colectomy with Dr. Johney Maine    08/23/2015 PET scan    Primarily similar hypermetabolic osseous foci within the R sided ribs. new L third rib focus of hypermetab and sclerosis is favored to be related to healing FX. no soft tissue myeloma id'd. Presumed postop hypermetab and edema about the R pelvic wall  03/06/2016 PET scan    1. Reduced activity in the prior lesion such as the right second and fifth rib lesions, activity nearly completely resolved and significantly less than added mediastinal blood pool activity. No new lesions are identified. 2. Other imaging findings of potential clinical significance: Coronary, aortic arch, and branch vessel atherosclerotic vascular disease. Aortoiliac atherosclerotic vascular disease. Enlarged prostate gland. Colonic diverticula.    02/17/2017 PET scan    HEAD/NECK: No hypermetabolic  activity in the scalp. No hypermetabolic cervical lymph nodes. CHEST: No hypermetabolic mediastinal or hilar nodes. Right upper lobe scarring/atelectasis. No suspicious pulmonary nodules on the CT scan. ABDOMEN/PELVIS: No abnormal hypermetabolic activity within the liver, pancreas, adrenal glands, or spleen. No hypermetabolic lymph nodes in the abdomen or pelvis. Atherosclerotic calcifications of the abdominal aorta and branch vessels. Left renal sinus cysts. Sigmoid diverticulosis, without evidence of diverticulitis. Prostatomegaly. SKELETON: Vague hypermetabolism involving the inferior sternum, max SUV 3.2, previously 8.2. Focal hypermetabolism involving the left sacrum, max SUV 4.0, previously 6.7. EXTREMITIES: No abnormal hypermetabolic activity in the lower extremities.    02/18/2017 -  Chemotherapy    Bortezomib 1.52m/m2 QWk + Dexamethasone 16mQTue/Fri --Cycle #1, 02/18/17     02/18/2017 - 08/26/2017 Chemotherapy    The patient had bortezomib SQ (VELCADE) chemo injection 3 mg, 1.3 mg/m2 = 3 mg, Subcutaneous,  Once, 7 of 9 cycles Administration: 3 mg (02/18/2017), 3 mg (02/26/2017), 3 mg (03/04/2017), 3 mg (03/11/2017), 3 mg (04/08/2017), 3 mg (03/18/2017), 3 mg (03/25/2017), 3 mg (04/01/2017), 3 mg (05/06/2017), 3 mg (05/20/2017), 3 mg (06/03/2017), 3 mg (06/17/2017), 3 mg (07/01/2017), 3 mg (07/15/2017), 3 mg (07/29/2017), 3 mg (08/12/2017), 3 mg (08/26/2017)  for chemotherapy treatment.     09/14/2017 -  Chemotherapy    The patient had daratumumab (DARZALEX) 1,800 mg in sodium chloride 0.9 % 910 mL (1.8 mg/mL) chemo infusion, 15.9 mg/kg = 1,820 mg, Intravenous, Once, 1 of 1 cycle Administration: 1,800 mg (09/17/2017) daratumumab (DARZALEX) 1,800 mg in sodium chloride 0.9 % 410 mL (3.6 mg/mL) chemo infusion, 1,820 mg, Intravenous, Once, 1 of 7 cycles Administration: 1,800 mg (10/28/2017), 1,800 mg (11/04/2017)  for chemotherapy treatment.       INTERVAL HISTORY:  Dean [name].o. male returns for routine  follow-up multiple myeloma. Patient is here today with his wife. He is reporting trouble sleeping due to pain and indigestion. He is having pain in his back which is better with his medication. He has tried doubling his xanax to sleep however this did not help him. He is requesting something different. Overall he feels ready for treatment today. He reports his appetite at 25% and he is maintaining his weight well. His energy level is 25%. He denies any nausea, vomiting, or diarrhea.     REVIEW OF SYSTEMS:  Review of Systems  Constitutional: Positive for fatigue.  Neurological: Positive for numbness.  All other systems reviewed and are negative.    PAST MEDICAL/SURGICAL HISTORY:  Past Medical History:  Diagnosis Date  . BPH (benign prostatic hyperplasia)   . Colonic diverticular abscess 01/08/2015  . Diverticulitis 1127/25/36 complicated by abscess and required percutaneous drainage  . GERD (gastroesophageal reflux disease)   . H/O ETOH abuse   . History of chemotherapy last done jan 2017  . History of radiation therapy 01/05/13-02/10/13   45 gray to left occipital condyle region  . History of radiation therapy 12/01/16-12/10/16   Parasternal nodule, chest- 24 Gy total delivered in 8 fractions, Left sacro-iliac, pelvis- 24 Gy  total delivered in 8 fractions   . History of radiation therapy 02/19/17-02/22/17   right temporal scalp 30 Gy in 10 fractions  . Intra-abdominal abscess ([place])   . Multiple myeloma ([place]) 2015  . Neuropathy   . Radiation 02/21/14-03/08/14   right posterior chest wall area 30 gray  . Radiation 04/19/14-05/02/14   lumbar spine 25 gray  . Skull lesion    Left occipital condyle  . Wrist fracture, left    x 2   Past Surgical History:  Procedure Laterality Date  . COLONOSCOPY N/A 04/19/2015   Procedure: COLONOSCOPY;  Surgeon: Danie Binder, MD;  Location: [facility];  Service: Endoscopy;  Laterality: N/A;  1:30 PM  . CYST EXCISION  1959   tail bone  . IR IMAGING  GUIDED PORT INSERTION  09/25/2017     SOCIAL HISTORY:  Social History   Socioeconomic History  . Marital status: Married    Spouse name: Not on file  . Number of children: 3  . Years of education: Not on file  . Highest education level: Not on file  Occupational History    Employer: RETIRED  Social Needs  . Financial resource strain: Not on file  . Food insecurity:    Worry: Not on file    Inability: Not on file  . Transportation needs:    Medical: Not on file    Non-medical: Not on file  Tobacco Use  . Smoking status: Never Smoker  . Smokeless tobacco: Never Used  Substance and Sexual Activity  . Alcohol use: No    Comment: " Not much no more"  . Drug use: No  . Sexual activity: Not on file  Lifestyle  . Physical activity:    Days per week: Not on file    Minutes per session: Not on file  . Stress: Not on file  Relationships  . Social connections:    Talks on phone: Not on file    Gets together: Not on file    Attends religious service: Not on file    Active member of club or organization: Not on file    Attends meetings of clubs or organizations: Not on file    Relationship status: Not on file  . Intimate partner violence:    Fear of current or ex partner: Not on file    Emotionally abused: Not on file    Physically abused: Not on file    Forced sexual activity: Not on file  Other Topics Concern  . Not on file  Social History Narrative  . Not on file    FAMILY HISTORY:  Family History  Problem Relation Age of Onset  . Diabetes Mother   . Stroke Mother   . Hypertension Father     CURRENT MEDICATIONS:  Outpatient Encounter Medications as of 11/11/2017  Medication Sig Note  . acyclovir (ZOVIRAX) 400 MG tablet Take 1 tablet (400 mg total) by mouth 2 (two) times daily.   Marland Kitchen ALPRAZolam (XANAX) 1 MG tablet TAKE 2 TABLETS BY MOUTH AT BEDTIME AS NEEDED FOR ANXIETY AND OR SLEEP. (Patient taking differently: Take 0.5-2 mg by mouth at bedtime as needed for  anxiety or sleep. )   . aspirin EC 325 MG tablet Take 1 tablet (325 mg total) by mouth daily.   Marland Kitchen CALCIUM-VITAMIN D PO Take 1 tablet by mouth 2 (two) times daily.    Marland Kitchen dexamethasone (DECADRON) 4 MG tablet Take 2.5 tablets (10 mg total) by mouth once a week. Take  10 mg once weekly 09/18/2017: Started on 09/18/17  . furosemide (LASIX) 20 MG tablet Take 1 tablet (20 mg total) by mouth daily as needed for edema.   . gabapentin (NEURONTIN) 400 MG capsule TAKE 5 CAPSULES BY MOUTH DAILY (Patient taking differently: Take 400 mg by mouth 2 (two) times daily. )   . HYDROcodone-acetaminophen (NORCO/VICODIN) 5-325 MG tablet TAKE 1 TABLET BY MOUTH EVERY 6 HOURS AS NEEDED FOR MODERATE PAIN   . lidocaine-prilocaine (EMLA) cream Apply small amount over port one (1) hour prior to appointment.   Marland Kitchen MAGNESIUM PO Take 1 tablet by mouth at bedtime.   Marland Kitchen omeprazole (PRILOSEC) 20 MG capsule Take 1 capsule (20 mg total) by mouth daily.   . Oxycodone HCl 10 MG TABS Take 1 tablet (10 mg total) by mouth every 8 (eight) hours as needed.   . pomalidomide (POMALYST) 2 MG capsule Take 1 capsule (2 mg total) by mouth daily. Take with water on days 1-21. Repeat every 28 days.   . predniSONE (DELTASONE) 20 MG tablet Take 20 mg by mouth daily with breakfast. Take 60 mg by mouth for 5 days. 09/24/2017: Taking 60 mg by mouth for 5 days  . tamsulosin (FLOMAX) 0.4 MG CAPS capsule Take 1 capsule (0.4 mg total) by mouth at bedtime.   . temazepam (RESTORIL) 15 MG capsule Take 1 capsule (15 mg total) by mouth at bedtime as needed for sleep.   Marland Kitchen zolendronic acid (ZOMETA) 4 MG/5ML injection Inject 4 mg into the vein every 30 (thirty) days.    . [DISCONTINUED] omeprazole (PRILOSEC) 20 MG capsule Take 1 capsule (20 mg total) by mouth daily. (Patient taking differently: Take 20-40 mg by mouth daily as needed (heartburn). )    No facility-administered encounter medications on file as of 11/11/2017.     ALLERGIES:  Allergies  Allergen Reactions  .  Codeine Other (See Comments)    Headache  . Morphine And Related Other (See Comments)    Makes him feel weird  . Revlimid [Lenalidomide] Other (See Comments)    "Causes me to become weak"     PHYSICAL EXAM:  ECOG Performance status: 1  Vitals:   11/11/17 0928  BP: 134/82  Pulse: 82  Resp: 20  Temp: (!) 97.2 F (36.2 C)  SpO2: 97%   Filed Weights   11/11/17 0928  Weight: 238 lb (108 kg)    Physical Exam  Constitutional: He is oriented to person, place, and time. He appears well-developed and well-nourished.  Cardiovascular: Normal rate, regular rhythm and normal heart sounds.  Pulmonary/Chest: Effort normal and breath sounds normal.  Musculoskeletal: Normal range of motion.  Walks with a cane  Neurological: He is alert and oriented to person, place, and time.  Skin: Skin is warm and dry.  Psychiatric: He has a normal mood and affect. His behavior is normal. Judgment and thought content normal.     LABORATORY DATA:  I have reviewed the labs as listed.  CBC    Component Value Date/Time   WBC 4.9 11/11/2017 0816   RBC 4.69 11/11/2017 0816   HGB 14.2 11/11/2017 0816   HCT 43.9 11/11/2017 0816   PLT 88 (L) 11/11/2017 0816   MCV 93.6 11/11/2017 0816   MCH 30.3 11/11/2017 0816   MCHC 32.3 11/11/2017 0816   RDW 13.2 11/11/2017 0816   LYMPHSABS 0.7 11/11/2017 0816   MONOABS 0.5 11/11/2017 0816   EOSABS 0.0 11/11/2017 0816   BASOSABS 0.0 11/11/2017 0816   CMP Latest Ref  Rng & Units 11/11/2017 11/04/2017 10/28/2017  Glucose 70 - 99 mg/dL 113(H) 107(H) 119(H)  BUN 8 - 23 mg/dL 20 18 24(H)  Creatinine 0.61 - 1.24 mg/dL 0.79 0.92 0.78  Sodium 135 - 145 mmol/L 139 139 141  Potassium 3.5 - 5.1 mmol/L 4.4 4.0 3.8  Chloride 98 - 111 mmol/L 108 106 106  CO2 22 - 32 mmol/L 23 23 25   Calcium 8.9 - 10.3 mg/dL 8.9 8.4(L) 9.1  Total Protein 6.5 - 8.1 g/dL 6.1(L) 5.9(L) 6.6  Total Bilirubin 0.3 - 1.2 mg/dL 0.5 0.8 0.6  Alkaline Phos 38 - 126 U/L 35(L) 24(L) 24(L)  AST 15 - 41  U/L 15 19 18   ALT 0 - 44 U/L 16 14 17           ASSESSMENT & PLAN:   Multiple myeloma without remission ([facility]) 1.  IgG kappa multiple myeloma: - Initially treated with RVD from 04/07/2014 through 07/28/2014, placed on maintenance Revlimid, which was discontinued 1 and half year ago, unknown reasons, patient thinks it was discontinued when he was receiving radiation. -MRI in January of the brain showed new right temporal lesion, status post radiation therapy, PET/CT scan in January 2019 showed improvement of multiple myeloma with no new lesions, started on weekly Velcade and dexamethasone on 02/18/2017, status post 2 cycles, Velcade changed to every other week on 05/06/2017 secondary to neuropathy, takes dexamethasone 20 mg on days of Velcade. - We reviewed the results of the M spike which was stable at 0.3.  Free light chain ratio went up to 4. -Because of persistent back pain, I have done an MRI of the thoracic and lumbar spine.  We discussed the results of the scans today.  He had a new rib lesion close to the spine on the left side, possibly eighth rib.  There is a soft tissue mass in that area.  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  There is also chronic L4 compression fracture which is stable. - Velcade was discontinued secondary to progression.  I have recommended a much stronger regimen containing daratumumab, pomalidomide and dexamethasone.  Daratumumab will be given weekly for the first 8 weeks.  Pomalidomide will be started at a reduced dose of 2 mg, 21 days on, 7 days off.  Dexamethasone will be taken at a flat dose of 10 mg on a weekly basis.  We talked about the side effects of this regimen in detail. -DPd started on 09/17/2017.  He developed erythematous maculopapular rash involving the trunk after taking 3 pills of pomalidomide.  It improved after steroids.  (Pomalidomide dose is 2 mg 3 weeks on 1 week off, dexamethasone doses 10 mg weekly). -Status post XRT to  the left eighth rib lesion, 30 Gy in 10 fractions from 09/29/2017 through 10/14/2017. - Daratumumab restarted after radiation on 10/28/2017.  He tolerated next 2 doses very well.  He may proceed with his next cycle  today. -Labs from 10/15/2017 showed M spike of 0.3.  Free light chain ratio was 3.17. -he had mild thrombocytopenia which we will closely watch.  Platelet count today is 88.  2.  Peripheral neuropathy: He will continue Neurontin 3 times a day for his numbness in the feet and legs.   3.  Bone protection: -He is receiving Zometa every 8 weeks.  Last Zometa was on 10/15/2017.  He will continue calcium and vitamin D.  4.  Back pain: -This is from combination of myeloma and degenerative disc disease.  He has bilateral  flank pain and lower back pain.  He also has chronic left hip and knee pain. -His pain is reasonably controlled with oxycodone 10 mg 3 times a day.   5.  Sleep: - He complains of difficulty falling asleep.  He has tried taking 2 mg of Xanax which did not help.  We will send a prescription for Restoril 15 mg at bedtime.  We will plan to increase it to 30 mg if he does not get help.      Orders placed this encounter:  Orders Placed This Encounter  Procedures  . Protein electrophoresis, serum  . Kappa/lambda light chains  . Lactate dehydrogenase  . CBC with Differential/Platelet  . Comprehensive metabolic panel      Derek Jack, MD [place] [phone]

## 2017-11-11 NOTE — Patient Instructions (Signed)
[facility] at [facility] Discharge Instructions     Thank you for choosing [facility] at [facility] to provide your oncology and hematology care.  To afford each patient quality time with our provider, please arrive at least 15 minutes before your scheduled appointment time.   If you have a lab appointment with the Cancer Center please come in thru the  Main Entrance and check in at the main information desk  You need to re-schedule your appointment should you arrive 10 or more minutes late.  We strive to give you quality time with our providers, and arriving late affects you and other patients whose appointments are after yours.  Also, if you no show three or more times for appointments you may be dismissed from the clinic at the providers discretion.     Again, thank you for choosing [facility].  Our hope is that these requests will decrease the amount of time that you wait before being seen by our physicians.       _____________________________________________________________  Should you have questions after your visit to [facility], please contact our office at [phone] between the hours of 8:00 a.m. and 4:30 p.m.  Voicemails left after 4:00 p.m. will not be returned until the following business day.  For prescription refill requests, have your pharmacy contact our office and allow 72 hours.    Cancer Center Support Programs:   > Cancer Support Group  2nd Tuesday of the month 1pm-2pm, Journey Room    

## 2017-11-11 NOTE — Assessment & Plan Note (Addendum)
1.  IgG kappa multiple myeloma: - Initially treated with RVD from 04/07/2014 through 07/28/2014, placed on maintenance Revlimid, which was discontinued 1 and half year ago, unknown reasons, patient thinks it was discontinued when he was receiving radiation. -MRI in January of the brain showed new right temporal lesion, status post radiation therapy, PET/CT scan in January 2019 showed improvement of multiple myeloma with no new lesions, started on weekly Velcade and dexamethasone on 02/18/2017, status post 2 cycles, Velcade changed to every other week on 05/06/2017 secondary to neuropathy, takes dexamethasone 20 mg on days of Velcade. - We reviewed the results of the M spike which was stable at 0.3.  Free light chain ratio went up to 4. -Because of persistent back pain, I have done an MRI of the thoracic and lumbar spine.  We discussed the results of the scans today.  He had a new rib lesion close to the spine on the left side, possibly eighth rib.  There is a soft tissue mass in that area.  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  There is also chronic L4 compression fracture which is stable. - Velcade was discontinued secondary to progression.  I have recommended a much stronger regimen containing daratumumab, pomalidomide and dexamethasone.  Daratumumab will be given weekly for the first 8 weeks.  Pomalidomide will be started at a reduced dose of 2 mg, 21 days on, 7 days off.  Dexamethasone will be taken at a flat dose of 10 mg on a weekly basis.  We talked about the side effects of this regimen in detail. -DPd started on 09/17/2017.  He developed erythematous maculopapular rash involving the trunk after taking 3 pills of pomalidomide.  It improved after steroids.  (Pomalidomide dose is 2 mg 3 weeks on 1 week off, dexamethasone doses 10 mg weekly). -Status post XRT to the left eighth rib lesion, 30 Gy in 10 fractions from 09/29/2017 through 10/14/2017. - Daratumumab restarted after  radiation on 10/28/2017.  He tolerated next 2 doses very well.  He may proceed with his next cycle  today. -Labs from 10/15/2017 showed M spike of 0.3.  Free light chain ratio was 3.17. -he had mild thrombocytopenia which we will closely watch.  Platelet count today is 88.  2.  Peripheral neuropathy: He will continue Neurontin 3 times a day for his numbness in the feet and legs.   3.  Bone protection: -He is receiving Zometa every 8 weeks.  Last Zometa was on 10/15/2017.  He will continue calcium and vitamin D.  4.  Back pain: -This is from combination of myeloma and degenerative disc disease.  He has bilateral flank pain and lower back pain.  He also has chronic left hip and knee pain. -His pain is reasonably controlled with oxycodone 10 mg 3 times a day.   5.  Sleep: - He complains of difficulty falling asleep.  He has tried taking 2 mg of Xanax which did not help.  We will send a prescription for Restoril 15 mg at bedtime.  We will plan to increase it to 30 mg if he does not get help.

## 2017-11-11 NOTE — Progress Notes (Signed)
Tolerated infusion w/o adverse reaction.  Alert, in no distress.  VSS.  Discharged ambulatory in c/o spouse.  

## 2017-11-12 ENCOUNTER — Ambulatory Visit (HOSPITAL_COMMUNITY): Payer: Medicare Other

## 2017-11-12 ENCOUNTER — Other Ambulatory Visit (HOSPITAL_COMMUNITY): Payer: Medicare Other

## 2017-11-16 ENCOUNTER — Encounter: Payer: Self-pay | Admitting: Radiation Oncology

## 2017-11-16 ENCOUNTER — Other Ambulatory Visit: Payer: Self-pay

## 2017-11-16 ENCOUNTER — Ambulatory Visit
Admission: RE | Admit: 2017-11-16 | Discharge: 2017-11-16 | Disposition: A | Discharge status: Home / Self Care | Payer: Medicare Other | Source: Ambulatory Visit | Attending: Radiation Oncology | Admitting: Radiation Oncology

## 2017-11-16 VITALS — BP 129/65 | HR 62 | Temp 97.9°F | Resp 20 | Ht 68.0 in | Wt 238.4 lb

## 2017-11-16 DIAGNOSIS — Z7982 Long term (current) use of aspirin: Secondary | ICD-10-CM | POA: Diagnosis not present

## 2017-11-16 DIAGNOSIS — G8929 Other chronic pain: Secondary | ICD-10-CM | POA: Diagnosis not present

## 2017-11-16 DIAGNOSIS — Z885 Allergy status to narcotic agent status: Secondary | ICD-10-CM | POA: Insufficient documentation

## 2017-11-16 DIAGNOSIS — Z923 Personal history of irradiation: Secondary | ICD-10-CM | POA: Diagnosis not present

## 2017-11-16 DIAGNOSIS — C9000 Multiple myeloma not having achieved remission: Secondary | ICD-10-CM | POA: Diagnosis not present

## 2017-11-16 DIAGNOSIS — Z79899 Other long term (current) drug therapy: Secondary | ICD-10-CM | POA: Insufficient documentation

## 2017-11-16 NOTE — Progress Notes (Signed)
Radiation Oncology         [phone]) [phone] ________________________________  Name: Dean [name]. MRN: [id number]  Date: 11/16/2017  DOB: May 26, 1935  Follow-Up Visit Note  CC: [name], MD  Derek Jack, MD    ICD-10-CM   1. Multiple myeloma without remission [facility]) C90.00     Diagnosis:   Recurrent Multiple Myeloma   Interval Since Last Radiation:  1 month and 5 days 09/29/17 - 10/14/17: Ribs, Left, 30 Gy in 10 fractions/ Isodose, 10X  02/19/17 - 02/22/17: Scalp, Right Temporal, 30 Gy in 10 fractions/ 3D, 6X  04/19/14 - 05/02/14: Spine, Lumbar, 25 Gy in 10 fractions/ 3D, 10X//15X  01/05/13 - 02/10/13: Left Occipital Condyle Region, 45 Gy in 25 fractions   Narrative:  The patient returns today for routine follow-up. He is accompanied by his wife. He receives chemotherapy weekly with his next infusion on Wednesday. He last saw Dr. Delton Coombes on 11/11/17 and will follow up on 12/02/17.   He reports pain to the ride side of his back, staring at his ribs and radiating down to his belt line. He also reports pain to the left side of his neck in the previous radiation site, left back, and foot. He reports severe fatigue.              ALLERGIES:  is allergic to codeine; morphine and related; and revlimid [lenalidomide].  Meds: Current Outpatient Medications  Medication Sig Dispense Refill  . acyclovir (ZOVIRAX) 400 MG tablet Take 1 tablet (400 mg total) by mouth 2 (two) times daily. 60 tablet 3  . ALPRAZolam (XANAX) 1 MG tablet TAKE 2 TABLETS BY MOUTH AT BEDTIME AS NEEDED FOR ANXIETY AND OR SLEEP. (Patient taking differently: Take 0.5-2 mg by mouth at bedtime as needed for anxiety or sleep. ) 60 tablet 1  . aspirin EC 325 MG tablet Take 1 tablet (325 mg total) by mouth daily. 30 tablet 11  . CALCIUM-VITAMIN D PO Take 1 tablet by mouth 2 (two) times daily.     Marland Kitchen dexamethasone (DECADRON) 4 MG tablet Take 2.5 tablets (10 mg total) by mouth once a week. Take 10 mg once weekly 10  tablet 12  . furosemide (LASIX) 20 MG tablet Take 1 tablet (20 mg total) by mouth daily as needed for edema. 30 tablet 0  . gabapentin (NEURONTIN) 400 MG capsule TAKE 5 CAPSULES BY MOUTH DAILY (Patient taking differently: Take 400 mg by mouth 2 (two) times daily. ) 150 capsule 3  . HYDROcodone-acetaminophen (NORCO/VICODIN) 5-325 MG tablet TAKE 1 TABLET BY MOUTH EVERY 6 HOURS AS NEEDED FOR MODERATE PAIN 30 tablet 0  . lidocaine-prilocaine (EMLA) cream Apply small amount over port one (1) hour prior to appointment. 30 g 0  . MAGNESIUM PO Take 1 tablet by mouth at bedtime.    Marland Kitchen omeprazole (PRILOSEC) 20 MG capsule Take 1 capsule (20 mg total) by mouth daily. 30 capsule 5  . Oxycodone HCl 10 MG TABS Take 1 tablet (10 mg total) by mouth every 8 (eight) hours as needed. 50 tablet 0  . pomalidomide (POMALYST) 2 MG capsule Take 1 capsule (2 mg total) by mouth daily. Take with water on days 1-21. Repeat every 28 days. 21 capsule 0  . tamsulosin (FLOMAX) 0.4 MG CAPS capsule Take 1 capsule (0.4 mg total) by mouth at bedtime. 30 capsule 3  . temazepam (RESTORIL) 15 MG capsule Take 1 capsule (15 mg total) by mouth at bedtime as needed for sleep. 30 capsule 0  .  zolendronic acid (ZOMETA) 4 MG/5ML injection Inject 4 mg into the vein every 30 (thirty) days.     . predniSONE (DELTASONE) 20 MG tablet Take 20 mg by mouth daily with breakfast. Take 60 mg by mouth for 5 days.     No current facility-administered medications for this encounter.     Physical Findings: The patient is in no acute distress. Patient is alert and oriented.  height is 5' 8"  (1.727 m) and weight is 238 lb 6.4 oz (108.1 kg). His oral temperature is 97.9 F (36.6 C). His blood pressure is 129/65 and his pulse is 62. His respiration is 20 and oxygen saturation is 98%.  Lungs are clear to auscultation bilaterally. Heart has regular rate and rhythm. No palpable cervical, supraclavicular, or axillary adenopathy. Abdomen soft, non-tender, normal  bowel sounds.   Lab Findings: Lab Results  Component Value Date   WBC 4.9 11/11/2017   HGB 14.2 11/11/2017   HCT 43.9 11/11/2017   MCV 93.6 11/11/2017   PLT 88 (L) 11/11/2017    Radiographic Findings: No results found.  Impression:  Recurrent Multiple Myeloma  The patient is recovering from the effects of radiation.  Clinically stable. Chest/rib pain better after palliative radiation therapy. Patient has chronic pain not associated with his multiple myeloma.  Plan:  PRN follow up in radiation oncology. Patient will continue close follow up with medical oncology in [place].  -----------------------------------  Blair Promise, PhD, MD  This document serves as a record of services personally performed by Gery Pray, MD. It was created on his behalf by Wilburn Mylar, a trained medical scribe. The creation of this record is based on the scribe's personal observations and the provider's statements to them. This document has been checked and approved by the attending provider.

## 2017-11-16 NOTE — Progress Notes (Signed)
Pt presents today for f/u with Dr. Sondra Come. Pt is accompanied by wife. Pt reports pain on right side of back, starting at ribs and radiates down to "belt line". Pt also reports pain on both sides of back. Pt also c/o pain on left side of neck, "in same place as radiation in the past". Pt reports fatigue as severe. Pt reports skin as WNL. Pt receives chemotherapy weekly with next infusion on Wednesday.   BP 129/65 (BP Location: Right Arm, Patient Position: Sitting)   Pulse 62   Temp 97.9 F (36.6 C) (Oral)   Resp 20   Ht 5\' 8"  (1.727 m)   Wt 238 lb 6.4 oz (108.1 kg)   SpO2 98%   BMI 36.25 kg/m   Wt Readings from Last 3 Encounters:  11/16/17 238 lb 6.4 oz (108.1 kg)  11/11/17 238 lb (108 kg)  11/04/17 242 lb 3.2 oz (109.9 kg)   Loma Sousa, RN BSN

## 2017-11-18 ENCOUNTER — Inpatient Hospital Stay (HOSPITAL_COMMUNITY): Payer: Medicare Other

## 2017-11-18 ENCOUNTER — Encounter (HOSPITAL_COMMUNITY): Payer: Self-pay

## 2017-11-18 ENCOUNTER — Other Ambulatory Visit: Payer: Self-pay

## 2017-11-18 VITALS — BP 111/51 | HR 55 | Temp 97.3°F | Resp 18 | Wt 240.8 lb

## 2017-11-18 DIAGNOSIS — Z5112 Encounter for antineoplastic immunotherapy: Secondary | ICD-10-CM | POA: Diagnosis not present

## 2017-11-18 DIAGNOSIS — C9000 Multiple myeloma not having achieved remission: Secondary | ICD-10-CM

## 2017-11-18 LAB — COMPREHENSIVE METABOLIC PANEL
ALT: 17 U/L (ref 0–44)
AST: 17 U/L (ref 15–41)
Albumin: 3.3 g/dL — ABNORMAL LOW (ref 3.5–5.0)
Alkaline Phosphatase: 24 U/L — ABNORMAL LOW (ref 38–126)
Anion gap: 7 (ref 5–15)
BUN: 16 mg/dL (ref 8–23)
CO2: 26 mmol/L (ref 22–32)
Calcium: 8.2 mg/dL — ABNORMAL LOW (ref 8.9–10.3)
Chloride: 107 mmol/L (ref 98–111)
Creatinine, Ser: 0.66 mg/dL (ref 0.61–1.24)
GFR calc Af Amer: >60 mL/min (ref >60)
GFR calc non Af Amer: >60 mL/min (ref >60)
Glucose, Bld: 99 mg/dL (ref 70–99)
Potassium: 4 mmol/L (ref 3.5–5.1)
Sodium: 140 mmol/L (ref 135–145)
Total Bilirubin: 0.6 mg/dL (ref 0.3–1.2)
Total Protein: 5.9 g/dL — ABNORMAL LOW (ref 6.5–8.1)

## 2017-11-18 LAB — CBC WITH DIFFERENTIAL/PLATELET
Abs Immature Granulocytes: 0.01 10*3/uL (ref 0.00–0.07)
Basophils Absolute: 0 10*3/uL (ref 0.0–0.1)
Basophils Relative: 1 %
Eosinophils Absolute: 0 10*3/uL (ref 0.0–0.5)
Eosinophils Relative: 0 %
HCT: 41.6 % (ref 39.0–52.0)
Hemoglobin: 13.3 g/dL (ref 13.0–17.0)
Immature Granulocytes: 0 %
Lymphocytes Relative: 24 %
Lymphs Abs: 0.7 10*3/uL (ref 0.7–4.0)
MCH: 30.4 pg (ref 26.0–34.0)
MCHC: 32 g/dL (ref 30.0–36.0)
MCV: 95.2 fL (ref 80.0–100.0)
Monocytes Absolute: 0.3 10*3/uL (ref 0.1–1.0)
Monocytes Relative: 10 %
Neutro Abs: 2 10*3/uL (ref 1.7–7.7)
Neutrophils Relative %: 65 %
Platelets: 85 10*3/uL — ABNORMAL LOW (ref 150–400)
RBC: 4.37 MIL/uL (ref 4.22–5.81)
RDW: 13.3 % (ref 11.5–15.5)
WBC: 3.1 10*3/uL — ABNORMAL LOW (ref 4.0–10.5)
nRBC: 0 % (ref 0.0–0.2)

## 2017-11-18 LAB — LACTATE DEHYDROGENASE: LDH: 155 U/L (ref 98–192)

## 2017-11-18 MED ORDER — SODIUM CHLORIDE 0.9 % IV SOLN
Freq: Once | INTRAVENOUS | Status: AC
  Administered 2017-11-18: 09:00:00 via INTRAVENOUS

## 2017-11-18 MED ORDER — PROCHLORPERAZINE MALEATE 10 MG PO TABS
10.0000 mg | ORAL_TABLET | Freq: Once | ORAL | Status: AC
  Administered 2017-11-18: 10 mg via ORAL
  Filled 2017-11-18: qty 1

## 2017-11-18 MED ORDER — SODIUM CHLORIDE 0.9% FLUSH
10.0000 mL | INTRAVENOUS | Status: DC | PRN
  Administered 2017-11-18: 10 mL
  Filled 2017-11-18: qty 10

## 2017-11-18 MED ORDER — SODIUM CHLORIDE 0.9 % IV SOLN
20.0000 mg | Freq: Once | INTRAVENOUS | Status: AC
  Administered 2017-11-18: 20 mg via INTRAVENOUS
  Filled 2017-11-18: qty 2

## 2017-11-18 MED ORDER — SODIUM CHLORIDE 0.9 % IV SOLN
15.8000 mg/kg | Freq: Once | INTRAVENOUS | Status: AC
  Administered 2017-11-18: 1800 mg via INTRAVENOUS
  Filled 2017-11-18: qty 80

## 2017-11-18 MED ORDER — ACETAMINOPHEN 325 MG PO TABS
650.0000 mg | ORAL_TABLET | Freq: Once | ORAL | Status: AC
  Administered 2017-11-18: 650 mg via ORAL
  Filled 2017-11-18: qty 2

## 2017-11-18 MED ORDER — DIPHENHYDRAMINE HCL 25 MG PO CAPS
50.0000 mg | ORAL_CAPSULE | Freq: Once | ORAL | Status: AC
  Administered 2017-11-18: 50 mg via ORAL
  Filled 2017-11-18: qty 2

## 2017-11-18 MED ORDER — HEPARIN SOD (PORK) LOCK FLUSH 100 UNIT/ML IV SOLN
500.0000 [IU] | Freq: Once | INTRAVENOUS | Status: AC | PRN
  Administered 2017-11-18: 500 [IU]
  Filled 2017-11-18 (×2): qty 5

## 2017-11-18 NOTE — Progress Notes (Signed)
Labs reviewed by Dr. Walden Field - she is aware of platelet count.  Okay to tx today per MD.   Tolerated infusion w/o adverse reaction.  Alert, in no distress.  VSS.  Discharged ambulatory.

## 2017-11-21 ENCOUNTER — Other Ambulatory Visit: Payer: Self-pay | Admitting: Family Medicine

## 2017-11-21 DIAGNOSIS — C9000 Multiple myeloma not having achieved remission: Secondary | ICD-10-CM

## 2017-11-21 DIAGNOSIS — G4700 Insomnia, unspecified: Secondary | ICD-10-CM

## 2017-11-23 NOTE — Telephone Encounter (Signed)
Pt is requesting refill on Xanax   LOV: 06/30/17  LRF:  09/18/17

## 2017-11-25 ENCOUNTER — Encounter (HOSPITAL_COMMUNITY): Payer: Self-pay

## 2017-11-25 ENCOUNTER — Inpatient Hospital Stay (HOSPITAL_COMMUNITY): Payer: Medicare Other

## 2017-11-25 VITALS — BP 110/68 | HR 54 | Temp 97.8°F | Resp 18 | Wt 245.8 lb

## 2017-11-25 DIAGNOSIS — C9000 Multiple myeloma not having achieved remission: Secondary | ICD-10-CM

## 2017-11-25 DIAGNOSIS — Z5112 Encounter for antineoplastic immunotherapy: Secondary | ICD-10-CM | POA: Diagnosis not present

## 2017-11-25 LAB — COMPREHENSIVE METABOLIC PANEL
ALT: 17 U/L (ref 0–44)
AST: 17 U/L (ref 15–41)
Albumin: 3.2 g/dL — ABNORMAL LOW (ref 3.5–5.0)
Alkaline Phosphatase: 30 U/L — ABNORMAL LOW (ref 38–126)
Anion gap: 6 (ref 5–15)
BUN: 17 mg/dL (ref 8–23)
CO2: 25 mmol/L (ref 22–32)
Calcium: 8.6 mg/dL — ABNORMAL LOW (ref 8.9–10.3)
Chloride: 107 mmol/L (ref 98–111)
Creatinine, Ser: 0.71 mg/dL (ref 0.61–1.24)
GFR calc Af Amer: >60 mL/min (ref >60)
GFR calc non Af Amer: >60 mL/min (ref >60)
Glucose, Bld: 100 mg/dL — ABNORMAL HIGH (ref 70–99)
Potassium: 4.1 mmol/L (ref 3.5–5.1)
Sodium: 138 mmol/L (ref 135–145)
Total Bilirubin: 0.6 mg/dL (ref 0.3–1.2)
Total Protein: 5.9 g/dL — ABNORMAL LOW (ref 6.5–8.1)

## 2017-11-25 LAB — CBC WITH DIFFERENTIAL/PLATELET
Abs Immature Granulocytes: 0.02 10*3/uL (ref 0.00–0.07)
Basophils Absolute: 0 10*3/uL (ref 0.0–0.1)
Basophils Relative: 0 %
Eosinophils Absolute: 0 10*3/uL (ref 0.0–0.5)
Eosinophils Relative: 0 %
HCT: 42.9 % (ref 39.0–52.0)
Hemoglobin: 13.8 g/dL (ref 13.0–17.0)
Immature Granulocytes: 1 %
Lymphocytes Relative: 22 %
Lymphs Abs: 0.9 10*3/uL (ref 0.7–4.0)
MCH: 30.3 pg (ref 26.0–34.0)
MCHC: 32.2 g/dL (ref 30.0–36.0)
MCV: 94.1 fL (ref 80.0–100.0)
Monocytes Absolute: 0.4 10*3/uL (ref 0.1–1.0)
Monocytes Relative: 9 %
Neutro Abs: 2.8 10*3/uL (ref 1.7–7.7)
Neutrophils Relative %: 68 %
Platelets: 79 10*3/uL — ABNORMAL LOW (ref 150–400)
RBC: 4.56 MIL/uL (ref 4.22–5.81)
RDW: 13.2 % (ref 11.5–15.5)
WBC: 4.1 10*3/uL (ref 4.0–10.5)
nRBC: 0 % (ref 0.0–0.2)

## 2017-11-25 MED ORDER — SODIUM CHLORIDE 0.9% FLUSH
10.0000 mL | INTRAVENOUS | Status: DC | PRN
  Administered 2017-11-25: 10 mL
  Filled 2017-11-25: qty 10

## 2017-11-25 MED ORDER — ACETAMINOPHEN 325 MG PO TABS
ORAL_TABLET | ORAL | Status: AC
  Filled 2017-11-25: qty 2

## 2017-11-25 MED ORDER — SODIUM CHLORIDE 0.9 % IV SOLN
1800.0000 mg | Freq: Once | INTRAVENOUS | Status: AC
  Administered 2017-11-25: 1800 mg via INTRAVENOUS
  Filled 2017-11-25: qty 80

## 2017-11-25 MED ORDER — HEPARIN SOD (PORK) LOCK FLUSH 100 UNIT/ML IV SOLN
500.0000 [IU] | Freq: Once | INTRAVENOUS | Status: AC | PRN
  Administered 2017-11-25: 500 [IU]

## 2017-11-25 MED ORDER — ACETAMINOPHEN 325 MG PO TABS
650.0000 mg | ORAL_TABLET | Freq: Once | ORAL | Status: AC
  Administered 2017-11-25: 650 mg via ORAL

## 2017-11-25 MED ORDER — PROCHLORPERAZINE MALEATE 10 MG PO TABS
ORAL_TABLET | ORAL | Status: AC
  Filled 2017-11-25: qty 1

## 2017-11-25 MED ORDER — DIPHENHYDRAMINE HCL 25 MG PO CAPS
ORAL_CAPSULE | ORAL | Status: AC
  Filled 2017-11-25: qty 2

## 2017-11-25 MED ORDER — DIPHENHYDRAMINE HCL 25 MG PO CAPS
50.0000 mg | ORAL_CAPSULE | Freq: Once | ORAL | Status: AC
  Administered 2017-11-25: 50 mg via ORAL

## 2017-11-25 MED ORDER — SODIUM CHLORIDE 0.9 % IV SOLN
Freq: Once | INTRAVENOUS | Status: AC
  Administered 2017-11-25: 09:00:00 via INTRAVENOUS

## 2017-11-25 MED ORDER — PROCHLORPERAZINE MALEATE 10 MG PO TABS
10.0000 mg | ORAL_TABLET | Freq: Once | ORAL | Status: AC
  Administered 2017-11-25: 10 mg via ORAL

## 2017-11-25 MED ORDER — SODIUM CHLORIDE 0.9 % IV SOLN
20.0000 mg | Freq: Once | INTRAVENOUS | Status: AC
  Administered 2017-11-25: 20 mg via INTRAVENOUS
  Filled 2017-11-25: qty 2

## 2017-11-25 NOTE — Patient Instructions (Signed)
[facility] Discharge Instructions for Patients Receiving Chemotherapy  Today you received the following chemotherapy agents daratumumab.    If you develop nausea and vomiting that is not controlled by your nausea medication, call the clinic.   BELOW ARE SYMPTOMS THAT SHOULD BE REPORTED IMMEDIATELY:  *FEVER GREATER THAN 100.5 F  *CHILLS WITH OR WITHOUT FEVER  NAUSEA AND VOMITING THAT IS NOT CONTROLLED WITH YOUR NAUSEA MEDICATION  *UNUSUAL SHORTNESS OF BREATH  *UNUSUAL BRUISING OR BLEEDING  TENDERNESS IN MOUTH AND THROAT WITH OR WITHOUT PRESENCE OF ULCERS  *URINARY PROBLEMS  *BOWEL PROBLEMS  UNUSUAL RASH Items with * indicate a potential emergency and should be followed up as soon as possible.  Feel free to call the clinic should you have any questions or concerns. The clinic phone number is [phone].  Please show the [facility] at check-in to the Emergency Department and triage nurse.

## 2017-11-25 NOTE — Progress Notes (Signed)
Reviewed labs with Dr. Walden Field with platelets 79 with verbal order ok to treat today. Patient tolerated therapy with no complaints voiced.  Port site clean and dry with no bruising or swelling noted at site.  Good blood return noted before and after administration of therapy.  Band aid applied.  VSS with discharge and left ambulatory with family with no s/s of distress noted.

## 2017-11-26 LAB — KAPPA/LAMBDA LIGHT CHAINS
Kappa free light chain: 25.3 mg/L — ABNORMAL HIGH (ref 3.3–19.4)
Kappa, lambda light chain ratio: 2.88 — ABNORMAL HIGH (ref 0.26–1.65)
Lambda free light chains: 8.8 mg/L (ref 5.7–26.3)

## 2017-11-27 LAB — PROTEIN ELECTROPHORESIS, SERUM
A/G Ratio: 1.2 (ref 0.7–1.7)
Albumin ELP: 3 g/dL (ref 2.9–4.4)
Alpha-1-Globulin: 0.3 g/dL (ref 0.0–0.4)
Alpha-2-Globulin: 0.8 g/dL (ref 0.4–1.0)
Beta Globulin: 0.9 g/dL (ref 0.7–1.3)
Gamma Globulin: 0.5 g/dL (ref 0.4–1.8)
Globulin, Total: 2.5 g/dL (ref 2.2–3.9)
M-Spike, %: 0.3 g/dL — ABNORMAL HIGH
Total Protein ELP: 5.5 g/dL — ABNORMAL LOW (ref 6.0–8.5)

## 2017-12-02 ENCOUNTER — Inpatient Hospital Stay (HOSPITAL_COMMUNITY): Payer: Medicare Other

## 2017-12-02 ENCOUNTER — Encounter (HOSPITAL_COMMUNITY): Payer: Self-pay | Admitting: Hematology

## 2017-12-02 ENCOUNTER — Inpatient Hospital Stay (HOSPITAL_BASED_OUTPATIENT_CLINIC_OR_DEPARTMENT_OTHER): Payer: Medicare Other | Admitting: Hematology

## 2017-12-02 ENCOUNTER — Encounter (HOSPITAL_COMMUNITY): Payer: Self-pay

## 2017-12-02 ENCOUNTER — Other Ambulatory Visit: Payer: Self-pay

## 2017-12-02 VITALS — BP 131/55 | HR 77 | Temp 98.5°F | Resp 18 | Wt 248.8 lb

## 2017-12-02 VITALS — BP 121/62 | HR 61 | Temp 97.7°F | Resp 18

## 2017-12-02 DIAGNOSIS — G4700 Insomnia, unspecified: Secondary | ICD-10-CM

## 2017-12-02 DIAGNOSIS — M25562 Pain in left knee: Secondary | ICD-10-CM | POA: Diagnosis not present

## 2017-12-02 DIAGNOSIS — R5383 Other fatigue: Secondary | ICD-10-CM

## 2017-12-02 DIAGNOSIS — M5136 Other intervertebral disc degeneration, lumbar region: Secondary | ICD-10-CM | POA: Diagnosis not present

## 2017-12-02 DIAGNOSIS — C9000 Multiple myeloma not having achieved remission: Secondary | ICD-10-CM

## 2017-12-02 DIAGNOSIS — M25552 Pain in left hip: Secondary | ICD-10-CM

## 2017-12-02 DIAGNOSIS — M545 Low back pain: Secondary | ICD-10-CM | POA: Diagnosis not present

## 2017-12-02 DIAGNOSIS — G629 Polyneuropathy, unspecified: Secondary | ICD-10-CM

## 2017-12-02 DIAGNOSIS — K219 Gastro-esophageal reflux disease without esophagitis: Secondary | ICD-10-CM | POA: Diagnosis not present

## 2017-12-02 DIAGNOSIS — G479 Sleep disorder, unspecified: Secondary | ICD-10-CM

## 2017-12-02 DIAGNOSIS — G8929 Other chronic pain: Secondary | ICD-10-CM

## 2017-12-02 DIAGNOSIS — R531 Weakness: Secondary | ICD-10-CM

## 2017-12-02 DIAGNOSIS — Z5112 Encounter for antineoplastic immunotherapy: Secondary | ICD-10-CM | POA: Diagnosis not present

## 2017-12-02 LAB — CBC WITH DIFFERENTIAL/PLATELET
Abs Immature Granulocytes: 0.01 10*3/uL (ref 0.00–0.07)
Basophils Absolute: 0 10*3/uL (ref 0.0–0.1)
Basophils Relative: 1 %
Eosinophils Absolute: 0 10*3/uL (ref 0.0–0.5)
Eosinophils Relative: 0 %
HCT: 39.4 % (ref 39.0–52.0)
Hemoglobin: 12.6 g/dL — ABNORMAL LOW (ref 13.0–17.0)
Immature Granulocytes: 0 %
Lymphocytes Relative: 28 %
Lymphs Abs: 0.8 10*3/uL (ref 0.7–4.0)
MCH: 30.3 pg (ref 26.0–34.0)
MCHC: 32 g/dL (ref 30.0–36.0)
MCV: 94.7 fL (ref 80.0–100.0)
Monocytes Absolute: 0.5 10*3/uL (ref 0.1–1.0)
Monocytes Relative: 17 %
Neutro Abs: 1.4 10*3/uL — ABNORMAL LOW (ref 1.7–7.7)
Neutrophils Relative %: 54 %
Platelets: 89 10*3/uL — ABNORMAL LOW (ref 150–400)
RBC: 4.16 MIL/uL — ABNORMAL LOW (ref 4.22–5.81)
RDW: 13.4 % (ref 11.5–15.5)
WBC: 2.7 10*3/uL — ABNORMAL LOW (ref 4.0–10.5)
nRBC: 0 % (ref 0.0–0.2)

## 2017-12-02 LAB — COMPREHENSIVE METABOLIC PANEL
ALT: 16 U/L (ref 0–44)
AST: 14 U/L — ABNORMAL LOW (ref 15–41)
Albumin: 3.2 g/dL — ABNORMAL LOW (ref 3.5–5.0)
Alkaline Phosphatase: 26 U/L — ABNORMAL LOW (ref 38–126)
Anion gap: 8 (ref 5–15)
BUN: 18 mg/dL (ref 8–23)
CO2: 25 mmol/L (ref 22–32)
Calcium: 8.7 mg/dL — ABNORMAL LOW (ref 8.9–10.3)
Chloride: 103 mmol/L (ref 98–111)
Creatinine, Ser: 0.83 mg/dL (ref 0.61–1.24)
GFR calc Af Amer: >60 mL/min (ref >60)
GFR calc non Af Amer: >60 mL/min (ref >60)
Glucose, Bld: 100 mg/dL — ABNORMAL HIGH (ref 70–99)
Potassium: 4.2 mmol/L (ref 3.5–5.1)
Sodium: 136 mmol/L (ref 135–145)
Total Bilirubin: 0.7 mg/dL (ref 0.3–1.2)
Total Protein: 5.7 g/dL — ABNORMAL LOW (ref 6.5–8.1)

## 2017-12-02 LAB — LACTATE DEHYDROGENASE: LDH: 129 U/L (ref 98–192)

## 2017-12-02 MED ORDER — TRAZODONE HCL 100 MG PO TABS: 100.0000 mg | ORAL_TABLET | Freq: Every day | ORAL | 0 refills | Status: DC

## 2017-12-02 MED ORDER — SODIUM CHLORIDE 0.9 % IV SOLN
15.8000 mg/kg | Freq: Once | INTRAVENOUS | Status: AC
  Administered 2017-12-02: 1800 mg via INTRAVENOUS
  Filled 2017-12-02: qty 80

## 2017-12-02 MED ORDER — DIPHENHYDRAMINE HCL 25 MG PO CAPS
50.0000 mg | ORAL_CAPSULE | Freq: Once | ORAL | Status: AC
  Administered 2017-12-02: 50 mg via ORAL
  Filled 2017-12-02: qty 2

## 2017-12-02 MED ORDER — HEPARIN SOD (PORK) LOCK FLUSH 100 UNIT/ML IV SOLN
500.0000 [IU] | Freq: Once | INTRAVENOUS | Status: AC | PRN
  Administered 2017-12-02: 500 [IU]

## 2017-12-02 MED ORDER — SODIUM CHLORIDE 0.9 % IV SOLN
20.0000 mg | Freq: Once | INTRAVENOUS | Status: AC
  Administered 2017-12-02: 20 mg via INTRAVENOUS
  Filled 2017-12-02: qty 2

## 2017-12-02 MED ORDER — SODIUM CHLORIDE 0.9 % IV SOLN
Freq: Once | INTRAVENOUS | Status: AC
  Administered 2017-12-02: 10:00:00 via INTRAVENOUS

## 2017-12-02 MED ORDER — PROCHLORPERAZINE MALEATE 10 MG PO TABS
10.0000 mg | ORAL_TABLET | Freq: Once | ORAL | Status: AC
  Administered 2017-12-02: 10 mg via ORAL
  Filled 2017-12-02: qty 1

## 2017-12-02 MED ORDER — OXYCODONE HCL 10 MG PO TABS: 10.0000 mg | ORAL_TABLET | Freq: Three times a day (TID) | ORAL | 0 refills | Status: DC | PRN

## 2017-12-02 MED ORDER — ACETAMINOPHEN 325 MG PO TABS
650.0000 mg | ORAL_TABLET | Freq: Once | ORAL | Status: AC
  Administered 2017-12-02: 650 mg via ORAL
  Filled 2017-12-02: qty 2

## 2017-12-02 NOTE — Assessment & Plan Note (Signed)
1.  IgG kappa multiple myeloma: - Initially treated with RVD from 04/07/2014 through 07/28/2014, placed on maintenance Revlimid, which was discontinued 1 and half year ago, unknown reasons, patient thinks it was discontinued when he was receiving radiation. -MRI in January of the brain showed new right temporal lesion, status post radiation therapy, PET/CT scan in January 2019 showed improvement of multiple myeloma with no new lesions, started on weekly Velcade and dexamethasone on 02/18/2017, status post 2 cycles, Velcade changed to every other week on 05/06/2017 secondary to neuropathy, takes dexamethasone 20 mg on days of Velcade. - We reviewed the results of the M spike which was stable at 0.3.  Free light chain ratio went up to 4. -Because of persistent back pain, I have done an MRI of the thoracic and lumbar spine.  We discussed the results of the scans today.  He had a new rib lesion close to the spine on the left side, possibly eighth rib.  There is a soft tissue mass in that area.  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  There is also chronic L4 compression fracture which is stable. - Velcade was discontinued secondary to progression.  I have recommended a much stronger regimen containing daratumumab, pomalidomide and dexamethasone.  Daratumumab will be given weekly for the first 8 weeks.  Pomalidomide will be started at a reduced dose of 2 mg, 21 days on, 7 days off.  Dexamethasone will be taken at a flat dose of 10 mg on a weekly basis.  We talked about the side effects of this regimen in detail. -DPd started on 09/17/2017.  He developed erythematous maculopapular rash involving the trunk after taking 3 pills of pomalidomide.  It improved after steroids.  (Pomalidomide dose is 2 mg 3 weeks on 1 week off, dexamethasone doses 10 mg weekly). -Status post XRT to the left eighth rib lesion, 30 Gy in 10 fractions from 09/29/2017 through 10/14/2017. - Daratumumab restarted after  radiation on 10/28/2017. - We discussed the results of the myeloma panel from 11/25/2017 which showed M spike of 0.3.  This was stable compared to the last one.  Free light chain ratio has improved to 2.88 from 3.17.  He completed week 6 of Darzalex on 11/25/2017.  He is taking pomalidomide 2 mg every other day with outbreak. -I have recommended him to be off of pomalidomide for the next 1 week and restart it at 2 mg every other day when he comes back for his next Darzalex infusion.  After eleven 6 infusion, he will be switched to every 2 weeks of Darzalex. - I will see him back in 3 weeks for follow-up.  2.  Peripheral neuropathy: He will continue Neurontin 3 times a day for his numbness in the feet and legs.   3.  Bone protection: -He is receiving Zometa every 8 weeks.  Last Zometa was on 10/15/2017.  He will continue calcium and vitamin D.  4.  Back pain: -This is from combination of myeloma and degenerative disc disease.  He has bilateral flank pain and lower back pain.  He also has chronic left hip and knee pain. -He is taking oxycodone 10 mg 1 to 2 tablets daily.  I have told him to increase up to 3 tablets a day as needed.  5.  Sleep: - He continues to have difficulty with sleep.  He is taking Xanax 2 mg.  He also tried taking Restoril 15 mg and 30 mg at bedtime which did not help  much. -We will start him on trazodone 100 mg at bedtime as needed.

## 2017-12-02 NOTE — Progress Notes (Signed)
Dean [name], [place] [address]   CLINIC:  Medical Oncology/Hematology  PCP:  [name], MD [address] [place] [id number] [phone]   REASON FOR VISIT: Follow-up for multiple myeloma  CURRENT THERAPY: pomalidomide every other day, daratumumab (DARZALEX), and dexamethasone  BRIEF ONCOLOGIC HISTORY:    Multiple myeloma without remission ([facility])   11/16/2012 Initial Diagnosis    L occipital condyle destructive lesion, not biopsied secondary to location. XRT    12/01/2012 Imaging    L3 superior endplate compression FX, no lytic lesions to suggest myeloma    02/06/2014 Imaging    soft tissue mass along the right posterior fifth rib with some rib destruction    02/16/2014 Miscellaneous    Normal CBC, Normal CMP, kappa,lamda ratio of 2.62 (abnormal), UIEP with slightly restricted band, IgG kappa, SPEP/IEP with monoclonal protein at 0.79 g/dl, normal igG, suppressed IGM    02/20/2014 Bone Marrow Biopsy    33% kappa restricted plasma cells Normal FISH, normal cytogenetics    02/21/2014 - 03/08/2014 Radiation Therapy    30Gy to R rib lesion, lesion not biopsied    03/16/2014 PET scan    Scattered hypermetabolic osseous lesions, including the calvarium, ribs, left scapula, sacrum, and left femoral shaft. An expansile left vertebral body lesion at L3 extends into the epidural space of the left lateral recess,     03/17/2014 - 04/07/2014 Chemotherapy    Velcade and Dexamethasone due to initial denial of Revlimid by insurance company.  Zometa monthly.    03/22/2014 Imaging    MR_ L-Spine- Enhancing lesions compatible with multiple myeloma at L2, L3, and S1-2.    04/07/2014 - 07/28/2014 Chemotherapy    RVD with Revlimid at 25 mg days 1-14.  Revlimid dose reduced to 25 mg every other day x 14 days with 7 day respite beginning on day 1 of cycle 2.    04/17/2014 Adverse Reaction    Revlimid-induced rash.  Treated with steroids.    07/28/2014 - 08/04/2014 Chemotherapy    Revlimid daily    08/04/2014 Adverse Reaction    Velcade-induced peripheral neuropathy    08/04/2014 Treatment Plan Change    D/C Velcade    08/11/2014 PET scan    Response to therapy. Improvement and resolution of foci of osseous hypermetabolism.    08/22/2014 - 08/28/2014 Chemotherapy    Revlimid 25 mg days 1-21 every 28 days    08/28/2014 Adverse Reaction    Revlimid-induced rash. Medication held.  Medrol dose Pak prescribed.    09/04/2014 - 01/08/2015 Chemotherapy    Revlimid 10 mg every other day, without dexamethasone    01/08/2015 - 01/15/2015 Hospital Admission    Colonic diverticular abscess with IR drain placement    03/02/2015 PET scan    Only bony uptake is low activity at site of deformity/callus at R second rib FX, high activity in sigmoid colon, advanced sigmoid divertidulosis. Abscess not resolved?    06/04/2015 Imaging    CT nonobstructive L renal calculus, diverticulosis of descending and sigmoid colon without inflammation, moderate prostatic enlargement    07/12/2015 Surgery    Diverticulitis s/p robotic sigmoid colectomy with Dr. Johney Maine    08/23/2015 PET scan    Primarily similar hypermetabolic osseous foci within the R sided ribs. new L third rib focus of hypermetab and sclerosis is favored to be related to healing FX. no soft tissue myeloma id'd. Presumed postop hypermetab and edema about the R pelvic wall  03/06/2016 PET scan    1. Reduced activity in the prior lesion such as the right second and fifth rib lesions, activity nearly completely resolved and significantly less than added mediastinal blood pool activity. No new lesions are identified. 2. Other imaging findings of potential clinical significance: Coronary, aortic arch, and branch vessel atherosclerotic vascular disease. Aortoiliac atherosclerotic vascular disease. Enlarged prostate gland. Colonic diverticula.    02/17/2017 PET scan    HEAD/NECK: No hypermetabolic  activity in the scalp. No hypermetabolic cervical lymph nodes. CHEST: No hypermetabolic mediastinal or hilar nodes. Right upper lobe scarring/atelectasis. No suspicious pulmonary nodules on the CT scan. ABDOMEN/PELVIS: No abnormal hypermetabolic activity within the liver, pancreas, adrenal glands, or spleen. No hypermetabolic lymph nodes in the abdomen or pelvis. Atherosclerotic calcifications of the abdominal aorta and branch vessels. Left renal sinus cysts. Sigmoid diverticulosis, without evidence of diverticulitis. Prostatomegaly. SKELETON: Vague hypermetabolism involving the inferior sternum, max SUV 3.2, previously 8.2. Focal hypermetabolism involving the left sacrum, max SUV 4.0, previously 6.7. EXTREMITIES: No abnormal hypermetabolic activity in the lower extremities.    02/18/2017 -  Chemotherapy    Bortezomib 1.31m/m2 QWk + Dexamethasone 138mQTue/Fri --Cycle #1, 02/18/17     02/18/2017 - 08/26/2017 Chemotherapy    The patient had bortezomib SQ (VELCADE) chemo injection 3 mg, 1.3 mg/m2 = 3 mg, Subcutaneous,  Once, 7 of 9 cycles Administration: 3 mg (02/18/2017), 3 mg (02/26/2017), 3 mg (03/04/2017), 3 mg (03/11/2017), 3 mg (04/08/2017), 3 mg (03/18/2017), 3 mg (03/25/2017), 3 mg (04/01/2017), 3 mg (05/06/2017), 3 mg (05/20/2017), 3 mg (06/03/2017), 3 mg (06/17/2017), 3 mg (07/01/2017), 3 mg (07/15/2017), 3 mg (07/29/2017), 3 mg (08/12/2017), 3 mg (08/26/2017)  for chemotherapy treatment.     09/14/2017 -  Chemotherapy    The patient had daratumumab (DARZALEX) 1,800 mg in sodium chloride 0.9 % 910 mL (1.8 mg/mL) chemo infusion, 15.9 mg/kg = 1,820 mg, Intravenous, Once, 1 of 1 cycle Administration: 1,800 mg (09/17/2017) daratumumab (DARZALEX) 1,800 mg in sodium chloride 0.9 % 410 mL (3.6 mg/mL) chemo infusion, 1,820 mg, Intravenous, Once, 2 of 7 cycles Administration: 1,800 mg (10/28/2017), 1,800 mg (11/04/2017), 1,800 mg (11/11/2017), 1,800 mg (11/18/2017), 1,800 mg (11/25/2017), 1,800 mg (12/02/2017)  for  chemotherapy treatment.       INTERVAL HISTORY:  Dean [name].o. male returns for routine follow-up for multiple myeloma. Patient is here today with his wife. He has had three episodes of spitting up blood. He is unsure if it is from his lung or where he burnt the inside of his mouth. He is still having severe back and hip pain. He feels weak and fatigued everyday. He is not very active at home. He is not sleeping well at night. The Restoril has not helped him at all. He reports his appetite at 75% and he is maintaining his weight well. His energy level is 25%. He denies any nausea, vomiting, or diarrhea. Denies any fevers, nights sweats, chills, or weight loss.     REVIEW OF SYSTEMS:  Review of Systems  Constitutional: Positive for fatigue.  Cardiovascular: Positive for leg swelling.  Neurological: Positive for dizziness and numbness.  Psychiatric/Behavioral: Positive for sleep disturbance.  All other systems reviewed and are negative.    PAST MEDICAL/SURGICAL HISTORY:  Past Medical History:  Diagnosis Date  . BPH (benign prostatic hyperplasia)   . Colonic diverticular abscess 01/08/2015  . Diverticulitis 1178/24/23 complicated by abscess and required percutaneous drainage  . GERD (gastroesophageal reflux disease)   . H/O ETOH  abuse   . History of chemotherapy last done jan 2017  . History of radiation therapy 01/05/13-02/10/13   45 gray to left occipital condyle region  . History of radiation therapy 12/01/16-12/10/16   Parasternal nodule, chest- 24 Gy total delivered in 8 fractions, Left sacro-iliac, pelvis- 24 Gy total delivered in 8 fractions   . History of radiation therapy 02/19/17-02/22/17   right temporal scalp 30 Gy in 10 fractions  . Intra-abdominal abscess ([facility])   . Multiple myeloma ([facility]) 2015  . Neuropathy   . Radiation 02/21/14-03/08/14   right posterior chest wall area 30 gray  . Radiation 04/19/14-05/02/14   lumbar spine 25 gray  . Skull lesion    Left occipital  condyle  . Wrist fracture, left    x 2   Past Surgical History:  Procedure Laterality Date  . COLONOSCOPY N/A 04/19/2015   Procedure: COLONOSCOPY;  Surgeon: Danie Binder, MD;  Location: [facility];  Service: Endoscopy;  Laterality: N/A;  1:30 PM  . CYST EXCISION  1959   tail bone  . IR IMAGING GUIDED PORT INSERTION  09/25/2017     SOCIAL HISTORY:  Social History   Socioeconomic History  . Marital status: Married    Spouse name: Not on file  . Number of children: 3  . Years of education: Not on file  . Highest education level: Not on file  Occupational History    Employer: RETIRED  Social Needs  . Financial resource strain: Not on file  . Food insecurity:    Worry: Not on file    Inability: Not on file  . Transportation needs:    Medical: Not on file    Non-medical: Not on file  Tobacco Use  . Smoking status: Never Smoker  . Smokeless tobacco: Never Used  Substance and Sexual Activity  . Alcohol use: No    Comment: " Not much no more"  . Drug use: No  . Sexual activity: Not on file  Lifestyle  . Physical activity:    Days per week: Not on file    Minutes per session: Not on file  . Stress: Not on file  Relationships  . Social connections:    Talks on phone: Not on file    Gets together: Not on file    Attends religious service: Not on file    Active member of club or organization: Not on file    Attends meetings of clubs or organizations: Not on file    Relationship status: Not on file  . Intimate partner violence:    Fear of current or ex partner: Not on file    Emotionally abused: Not on file    Physically abused: Not on file    Forced sexual activity: Not on file  Other Topics Concern  . Not on file  Social History Narrative  . Not on file    FAMILY HISTORY:  Family History  Problem Relation Age of Onset  . Diabetes Mother   . Stroke Mother   . Hypertension Father     CURRENT MEDICATIONS:  Outpatient Encounter Medications as of 12/02/2017   Medication Sig Note  . acyclovir (ZOVIRAX) 400 MG tablet Take 1 tablet (400 mg total) by mouth 2 (two) times daily.   Marland Kitchen ALPRAZolam (XANAX) 1 MG tablet TAKE 2 TABLETS BY MOUTH AT BEDTIME AS NEEDED FOR ANXIETY OR SLEEP.   Marland Kitchen aspirin EC 325 MG tablet Take 1 tablet (325 mg total) by mouth daily.   Marland Kitchen  CALCIUM-VITAMIN D PO Take 1 tablet by mouth 2 (two) times daily.    Marland Kitchen dexamethasone (DECADRON) 4 MG tablet Take 2.5 tablets (10 mg total) by mouth once a week. Take 10 mg once weekly 09/18/2017: Started on 09/18/17  . furosemide (LASIX) 20 MG tablet Take 1 tablet (20 mg total) by mouth daily as needed for edema.   . gabapentin (NEURONTIN) 400 MG capsule TAKE 5 CAPSULES BY MOUTH DAILY (Patient taking differently: Take 400 mg by mouth 2 (two) times daily. )   . HYDROcodone-acetaminophen (NORCO/VICODIN) 5-325 MG tablet TAKE 1 TABLET BY MOUTH EVERY 6 HOURS AS NEEDED FOR MODERATE PAIN   . lidocaine-prilocaine (EMLA) cream Apply small amount over port one (1) hour prior to appointment.   Marland Kitchen MAGNESIUM PO Take 1 tablet by mouth at bedtime.   Marland Kitchen omeprazole (PRILOSEC) 20 MG capsule Take 1 capsule (20 mg total) by mouth daily.   . Oxycodone HCl 10 MG TABS Take 1 tablet (10 mg total) by mouth every 8 (eight) hours as needed.   . pomalidomide (POMALYST) 2 MG capsule Take 1 capsule (2 mg total) by mouth daily. Take with water on days 1-21. Repeat every 28 days.   . predniSONE (DELTASONE) 20 MG tablet Take 20 mg by mouth daily with breakfast. Take 60 mg by mouth for 5 days. 09/24/2017: Taking 60 mg by mouth for 5 days  . tamsulosin (FLOMAX) 0.4 MG CAPS capsule Take 1 capsule (0.4 mg total) by mouth at bedtime.   . temazepam (RESTORIL) 15 MG capsule Take 1 capsule (15 mg total) by mouth at bedtime as needed for sleep.   . traZODone (DESYREL) 100 MG tablet Take 1 tablet (100 mg total) by mouth at bedtime.   Marland Kitchen zolendronic acid (ZOMETA) 4 MG/5ML injection Inject 4 mg into the vein every 30 (thirty) days.    . [DISCONTINUED]  Oxycodone HCl 10 MG TABS Take 1 tablet (10 mg total) by mouth every 8 (eight) hours as needed.    No facility-administered encounter medications on file as of 12/02/2017.     ALLERGIES:  Allergies  Allergen Reactions  . Codeine Other (See Comments)    Headache  . Morphine And Related Other (See Comments)    Makes him feel weird  . Revlimid [Lenalidomide] Other (See Comments)    "Causes me to become weak"     PHYSICAL EXAM:  ECOG Performance status: 1  Vitals:   12/02/17 0837  BP: (!) 131/55  Pulse: 77  Resp: 18  Temp: 98.5 F (36.9 C)  SpO2: 97%   Filed Weights   12/02/17 0837  Weight: 248 lb 12.8 oz (112.9 kg)    Physical Exam  Constitutional: He is oriented to person, place, and time. He appears well-developed and well-nourished.  Cardiovascular: Normal rate, regular rhythm and normal heart sounds.  Pulmonary/Chest: Effort normal and breath sounds normal.  Musculoskeletal: Normal range of motion.  Neurological: He is alert and oriented to person, place, and time.  Skin: Skin is warm and dry.  Psychiatric: He has a normal mood and affect. His behavior is normal. Judgment and thought content normal.  Oropharynx has no bleeding lesions.  No other lesions noted.   LABORATORY DATA:  I have reviewed the labs as listed.  CBC    Component Value Date/Time   WBC 2.7 (L) 12/02/2017 0817   RBC 4.16 (L) 12/02/2017 0817   HGB 12.6 (L) 12/02/2017 0817   HCT 39.4 12/02/2017 0817   PLT 89 (L) 12/02/2017 7357  MCV 94.7 12/02/2017 0817   MCH 30.3 12/02/2017 0817   MCHC 32.0 12/02/2017 0817   RDW 13.4 12/02/2017 0817   LYMPHSABS 0.8 12/02/2017 0817   MONOABS 0.5 12/02/2017 0817   EOSABS 0.0 12/02/2017 0817   BASOSABS 0.0 12/02/2017 0817   CMP Latest Ref Rng & Units 12/02/2017 11/25/2017 11/18/2017  Glucose 70 - 99 mg/dL 100(H) 100(H) 99  BUN 8 - 23 mg/dL 18 17 16   Creatinine 0.61 - 1.24 mg/dL 0.83 0.71 0.66  Sodium 135 - 145 mmol/L 136 138 140  Potassium 3.5 - 5.1  mmol/L 4.2 4.1 4.0  Chloride 98 - 111 mmol/L 103 107 107  CO2 22 - 32 mmol/L 25 25 26   Calcium 8.9 - 10.3 mg/dL 8.7(L) 8.6(L) 8.2(L)  Total Protein 6.5 - 8.1 g/dL 5.7(L) 5.9(L) 5.9(L)  Total Bilirubin 0.3 - 1.2 mg/dL 0.7 0.6 0.6  Alkaline Phos 38 - 126 U/L 26(L) 30(L) 24(L)  AST 15 - 41 U/L 14(L) 17 17  ALT 0 - 44 U/L 16 17 17          ASSESSMENT & PLAN:   Multiple myeloma without remission ([facility]) 1.  IgG kappa multiple myeloma: - Initially treated with RVD from 04/07/2014 through 07/28/2014, placed on maintenance Revlimid, which was discontinued 1 and half year ago, unknown reasons, patient thinks it was discontinued when he was receiving radiation. -MRI in January of the brain showed new right temporal lesion, status post radiation therapy, PET/CT scan in January 2019 showed improvement of multiple myeloma with no new lesions, started on weekly Velcade and dexamethasone on 02/18/2017, status post 2 cycles, Velcade changed to every other week on 05/06/2017 secondary to neuropathy, takes dexamethasone 20 mg on days of Velcade. - We reviewed the results of the M spike which was stable at 0.3.  Free light chain ratio went up to 4. -Because of persistent back pain, I have done an MRI of the thoracic and lumbar spine.  We discussed the results of the scans today.  He had a new rib lesion close to the spine on the left side, possibly eighth rib.  There is a soft tissue mass in that area.  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  There is also chronic L4 compression fracture which is stable. - Velcade was discontinued secondary to progression.  I have recommended a much stronger regimen containing daratumumab, pomalidomide and dexamethasone.  Daratumumab will be given weekly for the first 8 weeks.  Pomalidomide will be started at a reduced dose of 2 mg, 21 days on, 7 days off.  Dexamethasone will be taken at a flat dose of 10 mg on a weekly basis.  We talked about the side effects  of this regimen in detail. -DPd started on 09/17/2017.  He developed erythematous maculopapular rash involving the trunk after taking 3 pills of pomalidomide.  It improved after steroids.  (Pomalidomide dose is 2 mg 3 weeks on 1 week off, dexamethasone doses 10 mg weekly). -Status post XRT to the left eighth rib lesion, 30 Gy in 10 fractions from 09/29/2017 through 10/14/2017. - Daratumumab restarted after radiation on 10/28/2017. - We discussed the results of the myeloma panel from 11/25/2017 which showed M spike of 0.3.  This was stable compared to the last one.  Free light chain ratio has improved to 2.88 from 3.17.  He completed week 6 of Darzalex on 11/25/2017.  He is taking pomalidomide 2 mg every other day with outbreak. -I have recommended him to be off  of pomalidomide for the next 1 week and restart it at 2 mg every other day when he comes back for his next Darzalex infusion.  After eleven 6 infusion, he will be switched to every 2 weeks of Darzalex. - I will see him back in 3 weeks for follow-up.  2.  Peripheral neuropathy: He will continue Neurontin 3 times a day for his numbness in the feet and legs.   3.  Bone protection: -He is receiving Zometa every 8 weeks.  Last Zometa was on 10/15/2017.  He will continue calcium and vitamin D.  4.  Back pain: -This is from combination of myeloma and degenerative disc disease.  He has bilateral flank pain and lower back pain.  He also has chronic left hip and knee pain. -He is taking oxycodone 10 mg 1 to 2 tablets daily.  I have told him to increase up to 3 tablets a day as needed.  5.  Sleep: - He continues to have difficulty with sleep.  He is taking Xanax 2 mg.  He also tried taking Restoril 15 mg and 30 mg at bedtime which did not help much. -We will start him on trazodone 100 mg at bedtime as needed.      Orders placed this encounter:  Orders Placed This Encounter  Procedures  . Lactate dehydrogenase  . CBC with Differential/Platelet  .  Comprehensive metabolic panel      Derek Jack, MD [place] [phone]

## 2017-12-02 NOTE — Patient Instructions (Signed)
[facility] at [facility] Discharge Instructions     Thank you for choosing [facility] at [facility] to provide your oncology and hematology care.  To afford each patient quality time with our provider, please arrive at least 15 minutes before your scheduled appointment time.   If you have a lab appointment with the Cancer Center please come in thru the  Main Entrance and check in at the main information desk  You need to re-schedule your appointment should you arrive 10 or more minutes late.  We strive to give you quality time with our providers, and arriving late affects you and other patients whose appointments are after yours.  Also, if you no show three or more times for appointments you may be dismissed from the clinic at the providers discretion.     Again, thank you for choosing [facility].  Our hope is that these requests will decrease the amount of time that you wait before being seen by our physicians.       _____________________________________________________________  Should you have questions after your visit to [facility], please contact our office at [phone] between the hours of 8:00 a.m. and 4:30 p.m.  Voicemails left after 4:00 p.m. will not be returned until the following business day.  For prescription refill requests, have your pharmacy contact our office and allow 72 hours.    Cancer Center Support Programs:   > Cancer Support Group  2nd Tuesday of the month 1pm-2pm, Journey Room    

## 2017-12-02 NOTE — Progress Notes (Signed)
Exam and discussion today with Dr. Delton Coombes. MD is aware of all lab results.  Okay to proceed with daratumumab infusion today per MD.   Tolerated infusion w/o adverse reaction.  Alert, in no distress.  VSS.  Discharged ambulatory in c/o spouse.

## 2017-12-07 ENCOUNTER — Other Ambulatory Visit (HOSPITAL_COMMUNITY): Payer: Self-pay | Admitting: Nurse Practitioner

## 2017-12-07 ENCOUNTER — Other Ambulatory Visit: Payer: Self-pay | Admitting: Family Medicine

## 2017-12-07 DIAGNOSIS — R600 Localized edema: Secondary | ICD-10-CM

## 2017-12-07 DIAGNOSIS — G4700 Insomnia, unspecified: Secondary | ICD-10-CM

## 2017-12-07 DIAGNOSIS — C9000 Multiple myeloma not having achieved remission: Secondary | ICD-10-CM

## 2017-12-07 DIAGNOSIS — G479 Sleep disorder, unspecified: Secondary | ICD-10-CM

## 2017-12-08 ENCOUNTER — Telehealth: Payer: Self-pay | Admitting: Family Medicine

## 2017-12-08 DIAGNOSIS — C9000 Multiple myeloma not having achieved remission: Secondary | ICD-10-CM

## 2017-12-08 DIAGNOSIS — G4700 Insomnia, unspecified: Secondary | ICD-10-CM

## 2017-12-08 MED ORDER — ALPRAZOLAM 1 MG PO TABS: ORAL_TABLET | ORAL | 1 refills | Status: DC

## 2017-12-08 NOTE — Telephone Encounter (Signed)
Xanax failed to go through to the pharmacy can you resend please.

## 2017-12-09 ENCOUNTER — Inpatient Hospital Stay (HOSPITAL_COMMUNITY): Payer: Medicare Other

## 2017-12-09 ENCOUNTER — Inpatient Hospital Stay (HOSPITAL_COMMUNITY): Payer: Medicare Other | Attending: Hematology

## 2017-12-09 ENCOUNTER — Encounter (HOSPITAL_COMMUNITY): Payer: Self-pay

## 2017-12-09 VITALS — BP 136/57 | HR 71 | Temp 97.8°F | Resp 18 | Wt 245.2 lb

## 2017-12-09 DIAGNOSIS — Z5112 Encounter for antineoplastic immunotherapy: Secondary | ICD-10-CM | POA: Insufficient documentation

## 2017-12-09 DIAGNOSIS — C9000 Multiple myeloma not having achieved remission: Secondary | ICD-10-CM | POA: Diagnosis not present

## 2017-12-09 LAB — COMPREHENSIVE METABOLIC PANEL
ALT: 18 U/L (ref 0–44)
AST: 19 U/L (ref 15–41)
Albumin: 3.5 g/dL (ref 3.5–5.0)
Alkaline Phosphatase: 24 U/L — ABNORMAL LOW (ref 38–126)
Anion gap: 9 (ref 5–15)
BUN: 17 mg/dL (ref 8–23)
CO2: 23 mmol/L (ref 22–32)
Calcium: 8.4 mg/dL — ABNORMAL LOW (ref 8.9–10.3)
Chloride: 107 mmol/L (ref 98–111)
Creatinine, Ser: 0.7 mg/dL (ref 0.61–1.24)
GFR calc Af Amer: >60 mL/min (ref >60)
GFR calc non Af Amer: >60 mL/min (ref >60)
Glucose, Bld: 102 mg/dL — ABNORMAL HIGH (ref 70–99)
Potassium: 4.1 mmol/L (ref 3.5–5.1)
Sodium: 139 mmol/L (ref 135–145)
Total Bilirubin: 0.7 mg/dL (ref 0.3–1.2)
Total Protein: 6.3 g/dL — ABNORMAL LOW (ref 6.5–8.1)

## 2017-12-09 LAB — CBC WITH DIFFERENTIAL/PLATELET
Abs Immature Granulocytes: 0.01 10*3/uL (ref 0.00–0.07)
Basophils Absolute: 0 10*3/uL (ref 0.0–0.1)
Basophils Relative: 1 %
Eosinophils Absolute: 0 10*3/uL (ref 0.0–0.5)
Eosinophils Relative: 0 %
HCT: 41.5 % (ref 39.0–52.0)
Hemoglobin: 13.2 g/dL (ref 13.0–17.0)
Immature Granulocytes: 0 %
Lymphocytes Relative: 25 %
Lymphs Abs: 0.9 10*3/uL (ref 0.7–4.0)
MCH: 29.6 pg (ref 26.0–34.0)
MCHC: 31.8 g/dL (ref 30.0–36.0)
MCV: 93 fL (ref 80.0–100.0)
Monocytes Absolute: 0.5 10*3/uL (ref 0.1–1.0)
Monocytes Relative: 12 %
Neutro Abs: 2.4 10*3/uL (ref 1.7–7.7)
Neutrophils Relative %: 62 %
Platelets: 137 10*3/uL — ABNORMAL LOW (ref 150–400)
RBC: 4.46 MIL/uL (ref 4.22–5.81)
RDW: 13.5 % (ref 11.5–15.5)
WBC: 3.8 10*3/uL — ABNORMAL LOW (ref 4.0–10.5)
nRBC: 0 % (ref 0.0–0.2)

## 2017-12-09 MED ORDER — SODIUM CHLORIDE 0.9 % IV SOLN
1800.0000 mg | Freq: Once | INTRAVENOUS | Status: AC
  Administered 2017-12-09: 1800 mg via INTRAVENOUS
  Filled 2017-12-09: qty 10

## 2017-12-09 MED ORDER — DIPHENHYDRAMINE HCL 25 MG PO CAPS
ORAL_CAPSULE | ORAL | Status: AC
  Filled 2017-12-09: qty 2

## 2017-12-09 MED ORDER — ACETAMINOPHEN 325 MG PO TABS
650.0000 mg | ORAL_TABLET | Freq: Once | ORAL | Status: AC
  Administered 2017-12-09: 650 mg via ORAL

## 2017-12-09 MED ORDER — SODIUM CHLORIDE 0.9 % IV SOLN
20.0000 mg | Freq: Once | INTRAVENOUS | Status: AC
  Administered 2017-12-09: 20 mg via INTRAVENOUS
  Filled 2017-12-09: qty 2

## 2017-12-09 MED ORDER — PROCHLORPERAZINE MALEATE 10 MG PO TABS
ORAL_TABLET | ORAL | Status: AC
  Filled 2017-12-09: qty 1

## 2017-12-09 MED ORDER — HEPARIN SOD (PORK) LOCK FLUSH 100 UNIT/ML IV SOLN
500.0000 [IU] | Freq: Once | INTRAVENOUS | Status: AC | PRN
  Administered 2017-12-09: 500 [IU]

## 2017-12-09 MED ORDER — DIPHENHYDRAMINE HCL 25 MG PO CAPS
50.0000 mg | ORAL_CAPSULE | Freq: Once | ORAL | Status: AC
  Administered 2017-12-09: 50 mg via ORAL

## 2017-12-09 MED ORDER — SODIUM CHLORIDE 0.9% FLUSH
10.0000 mL | INTRAVENOUS | Status: DC | PRN
  Administered 2017-12-09: 10 mL
  Filled 2017-12-09: qty 10

## 2017-12-09 MED ORDER — PROCHLORPERAZINE MALEATE 10 MG PO TABS
10.0000 mg | ORAL_TABLET | Freq: Once | ORAL | Status: AC
  Administered 2017-12-09: 10 mg via ORAL

## 2017-12-09 MED ORDER — HEPARIN SOD (PORK) LOCK FLUSH 100 UNIT/ML IV SOLN
INTRAVENOUS | Status: AC
  Filled 2017-12-09: qty 5

## 2017-12-09 MED ORDER — SODIUM CHLORIDE 0.9 % IV SOLN
Freq: Once | INTRAVENOUS | Status: AC
  Administered 2017-12-09: 13:00:00 via INTRAVENOUS

## 2017-12-09 MED ORDER — ACETAMINOPHEN 325 MG PO TABS
ORAL_TABLET | ORAL | Status: AC
  Filled 2017-12-09: qty 2

## 2017-12-09 NOTE — Patient Instructions (Signed)
[facility] Discharge Instructions for Patients Receiving Chemotherapy   Beginning January 23rd 2017 lab work for the [facility] will be done in the  Main lab at [facility] on 1st floor. If you have a lab appointment with the [facility] please come in thru the  Main Entrance and check in at the main information desk   Today you received the following chemotherapy agents Darzalex.Follow-up as scheduled. Call for any questions or concerns  To help prevent nausea and vomiting after your treatment, we encourage you to take your nausea medication   If you develop nausea and vomiting, or diarrhea that is not controlled by your medication, call the clinic.  The clinic phone number is [phone]. Office hours are Monday-Friday 8:30am-5:00pm.  BELOW ARE SYMPTOMS THAT SHOULD BE REPORTED IMMEDIATELY:  *FEVER GREATER THAN 101.0 F  *CHILLS WITH OR WITHOUT FEVER  NAUSEA AND VOMITING THAT IS NOT CONTROLLED WITH YOUR NAUSEA MEDICATION  *UNUSUAL SHORTNESS OF BREATH  *UNUSUAL BRUISING OR BLEEDING  TENDERNESS IN MOUTH AND THROAT WITH OR WITHOUT PRESENCE OF ULCERS  *URINARY PROBLEMS  *BOWEL PROBLEMS  UNUSUAL RASH Items with * indicate a potential emergency and should be followed up as soon as possible. If you have an emergency after office hours please contact your primary care physician or go to the nearest emergency department.  Please call the clinic during office hours if you have any questions or concerns.   You may also contact the Patient Navigator at [phone] should you have any questions or need assistance in obtaining follow up care.      Resources For Cancer Patients and their Caregivers ? American Cancer Society: Can assist with transportation, wigs, general needs, runs Look Good Feel Better.        ([phone] ? Cancer Care: Provides financial assistance, online support groups, medication/co-pay assistance.  1-800-813-HOPE 719-457-0943) ? [facility] Assists [place] cancer patients and their families through emotional , educational and financial support.  912-473-3052 ? [place] DSS Where to apply for food stamps, Medicaid and utility assistance. 5074478559 ? [facility]: Transportation to medical appointments. 3034854795 ? Social Security Administration: May apply for disability if have a Stage IV cancer. 928-226-5749 763-650-4172 ? [employer], Disability and Transit Services: Assists with nutrition, care and transit needs. 830-720-0924

## 2017-12-09 NOTE — Progress Notes (Signed)
[name]. tolerated Darzalex infusion well without complaints or incident. Labs reviewed prior to administering this medication. VSS upon discharge. Pt discharged self ambulatory using cane in satisfactory condition accompanied by his wife

## 2017-12-11 ENCOUNTER — Other Ambulatory Visit (HOSPITAL_COMMUNITY): Payer: Self-pay | Admitting: *Deleted

## 2017-12-11 DIAGNOSIS — G629 Polyneuropathy, unspecified: Secondary | ICD-10-CM

## 2017-12-11 DIAGNOSIS — C9000 Multiple myeloma not having achieved remission: Secondary | ICD-10-CM

## 2017-12-11 MED ORDER — GABAPENTIN 400 MG PO CAPS: 400.0000 mg | ORAL_CAPSULE | Freq: Two times a day (BID) | ORAL | 3 refills | Status: DC

## 2017-12-16 ENCOUNTER — Inpatient Hospital Stay (HOSPITAL_COMMUNITY): Payer: Medicare Other

## 2017-12-16 VITALS — BP 126/56 | HR 53 | Temp 97.3°F | Resp 18 | Wt 244.0 lb

## 2017-12-16 DIAGNOSIS — C9000 Multiple myeloma not having achieved remission: Secondary | ICD-10-CM | POA: Diagnosis not present

## 2017-12-16 DIAGNOSIS — Z5112 Encounter for antineoplastic immunotherapy: Secondary | ICD-10-CM | POA: Diagnosis not present

## 2017-12-16 LAB — CBC WITH DIFFERENTIAL/PLATELET
Abs Immature Granulocytes: 0.01 10*3/uL (ref 0.00–0.07)
Basophils Absolute: 0 10*3/uL (ref 0.0–0.1)
Basophils Relative: 1 %
Eosinophils Absolute: 0.1 10*3/uL (ref 0.0–0.5)
Eosinophils Relative: 1 %
HCT: 41.1 % (ref 39.0–52.0)
Hemoglobin: 13.2 g/dL (ref 13.0–17.0)
Immature Granulocytes: 0 %
Lymphocytes Relative: 20 %
Lymphs Abs: 0.7 10*3/uL (ref 0.7–4.0)
MCH: 30.3 pg (ref 26.0–34.0)
MCHC: 32.1 g/dL (ref 30.0–36.0)
MCV: 94.5 fL (ref 80.0–100.0)
Monocytes Absolute: 0.3 10*3/uL (ref 0.1–1.0)
Monocytes Relative: 8 %
Neutro Abs: 2.6 10*3/uL (ref 1.7–7.7)
Neutrophils Relative %: 70 %
Platelets: 111 10*3/uL — ABNORMAL LOW (ref 150–400)
RBC: 4.35 MIL/uL (ref 4.22–5.81)
RDW: 13.9 % (ref 11.5–15.5)
WBC: 3.7 10*3/uL — ABNORMAL LOW (ref 4.0–10.5)
nRBC: 0 % (ref 0.0–0.2)

## 2017-12-16 LAB — COMPREHENSIVE METABOLIC PANEL
ALT: 14 U/L (ref 0–44)
AST: 15 U/L (ref 15–41)
Albumin: 3.3 g/dL — ABNORMAL LOW (ref 3.5–5.0)
Alkaline Phosphatase: 24 U/L — ABNORMAL LOW (ref 38–126)
Anion gap: 5 (ref 5–15)
BUN: 17 mg/dL (ref 8–23)
CO2: 25 mmol/L (ref 22–32)
Calcium: 8.5 mg/dL — ABNORMAL LOW (ref 8.9–10.3)
Chloride: 108 mmol/L (ref 98–111)
Creatinine, Ser: 0.69 mg/dL (ref 0.61–1.24)
GFR calc Af Amer: >60 mL/min (ref >60)
GFR calc non Af Amer: >60 mL/min (ref >60)
Glucose, Bld: 108 mg/dL — ABNORMAL HIGH (ref 70–99)
Potassium: 4.2 mmol/L (ref 3.5–5.1)
Sodium: 138 mmol/L (ref 135–145)
Total Bilirubin: 0.4 mg/dL (ref 0.3–1.2)
Total Protein: 5.9 g/dL — ABNORMAL LOW (ref 6.5–8.1)

## 2017-12-16 MED ORDER — SODIUM CHLORIDE 0.9 % IV SOLN
20.0000 mg | Freq: Once | INTRAVENOUS | Status: AC
  Administered 2017-12-16: 20 mg via INTRAVENOUS
  Filled 2017-12-16: qty 2

## 2017-12-16 MED ORDER — ACETAMINOPHEN 325 MG PO TABS
650.0000 mg | ORAL_TABLET | Freq: Once | ORAL | Status: AC
  Administered 2017-12-16: 650 mg via ORAL
  Filled 2017-12-16: qty 2

## 2017-12-16 MED ORDER — PROCHLORPERAZINE MALEATE 10 MG PO TABS
10.0000 mg | ORAL_TABLET | Freq: Once | ORAL | Status: AC
  Administered 2017-12-16: 10 mg via ORAL
  Filled 2017-12-16: qty 1

## 2017-12-16 MED ORDER — DIPHENHYDRAMINE HCL 25 MG PO CAPS
50.0000 mg | ORAL_CAPSULE | Freq: Once | ORAL | Status: AC
  Administered 2017-12-16: 50 mg via ORAL
  Filled 2017-12-16: qty 2

## 2017-12-16 MED ORDER — HEPARIN SOD (PORK) LOCK FLUSH 100 UNIT/ML IV SOLN
500.0000 [IU] | Freq: Once | INTRAVENOUS | Status: AC | PRN
  Administered 2017-12-16: 500 [IU]

## 2017-12-16 MED ORDER — SODIUM CHLORIDE 0.9 % IV SOLN
Freq: Once | INTRAVENOUS | Status: AC
  Administered 2017-12-16: 14:00:00 via INTRAVENOUS

## 2017-12-16 MED ORDER — SODIUM CHLORIDE 0.9 % IV SOLN
15.8000 mg/kg | Freq: Once | INTRAVENOUS | Status: AC
  Administered 2017-12-16: 1800 mg via INTRAVENOUS
  Filled 2017-12-16: qty 80

## 2017-12-16 MED ORDER — SODIUM CHLORIDE 0.9% FLUSH
10.0000 mL | INTRAVENOUS | Status: DC | PRN
  Administered 2017-12-16: 10 mL
  Filled 2017-12-16: qty 10

## 2017-12-16 MED ORDER — POMALIDOMIDE 2 MG PO CAPS: 2.0000 mg | ORAL_CAPSULE | ORAL | 0 refills | Status: DC

## 2017-12-16 NOTE — Patient Instructions (Signed)
[facility] Discharge Instructions for Patients Receiving Chemotherapy  Today you received the following chemotherapy agents   To help prevent nausea and vomiting after your treatment, we encourage you to take your nausea medication   If you develop nausea and vomiting that is not controlled by your nausea medication, call the clinic.   BELOW ARE SYMPTOMS THAT SHOULD BE REPORTED IMMEDIATELY:  *FEVER GREATER THAN 100.5 F  *CHILLS WITH OR WITHOUT FEVER  NAUSEA AND VOMITING THAT IS NOT CONTROLLED WITH YOUR NAUSEA MEDICATION  *UNUSUAL SHORTNESS OF BREATH  *UNUSUAL BRUISING OR BLEEDING  TENDERNESS IN MOUTH AND THROAT WITH OR WITHOUT PRESENCE OF ULCERS  *URINARY PROBLEMS  *BOWEL PROBLEMS  UNUSUAL RASH Items with * indicate a potential emergency and should be followed up as soon as possible.  Feel free to call the clinic should you have any questions or concerns. The clinic phone number is [phone].  Please show the CHEMO ALERT CARD at check-in to the Emergency Department and triage nurse.   

## 2017-12-16 NOTE — Progress Notes (Signed)
Pt here today for Darzalex q 28 days. VSS. Complains of pain in R hip and insomnia. Pt has had multiple falls in the last 3 months.   Spoke with Dr. Delton Coombes about Pomalyst medication. Per MD, " have pt start back today and take 2mg  every other day." Script printed with new frequency and signed by Dr. Delton Coombes. Script given to scheduler [employer].   Treatment given today per MD orders. Tolerated infusion without adverse affects. Vital signs stable. No complaints at this time. Discharged from clinic ambulatory. F/U with [facility] as scheduled.

## 2017-12-17 ENCOUNTER — Other Ambulatory Visit (HOSPITAL_COMMUNITY): Payer: Self-pay | Admitting: *Deleted

## 2017-12-17 DIAGNOSIS — C9000 Multiple myeloma not having achieved remission: Secondary | ICD-10-CM

## 2017-12-17 MED ORDER — POMALIDOMIDE 2 MG PO CAPS: 2.0000 mg | ORAL_CAPSULE | ORAL | 0 refills | Status: DC

## 2017-12-17 NOTE — Telephone Encounter (Signed)
New script printed with correct directions.  Verbal order given to [facility].

## 2017-12-23 ENCOUNTER — Ambulatory Visit (HOSPITAL_COMMUNITY): Payer: Medicare Other | Admitting: Hematology

## 2017-12-23 ENCOUNTER — Ambulatory Visit (HOSPITAL_COMMUNITY): Payer: Medicare Other

## 2017-12-23 ENCOUNTER — Other Ambulatory Visit (HOSPITAL_COMMUNITY): Payer: Medicare Other

## 2017-12-30 ENCOUNTER — Inpatient Hospital Stay (HOSPITAL_BASED_OUTPATIENT_CLINIC_OR_DEPARTMENT_OTHER): Payer: Medicare Other | Admitting: Hematology

## 2017-12-30 ENCOUNTER — Encounter (HOSPITAL_COMMUNITY): Payer: Self-pay | Admitting: Hematology

## 2017-12-30 ENCOUNTER — Encounter (HOSPITAL_COMMUNITY): Payer: Self-pay

## 2017-12-30 ENCOUNTER — Inpatient Hospital Stay (HOSPITAL_COMMUNITY): Payer: Medicare Other

## 2017-12-30 ENCOUNTER — Other Ambulatory Visit: Payer: Self-pay

## 2017-12-30 VITALS — BP 160/76 | HR 89 | Temp 97.5°F | Resp 18 | Wt 242.8 lb

## 2017-12-30 VITALS — BP 124/60 | HR 69 | Temp 98.1°F | Resp 18 | Wt 242.8 lb

## 2017-12-30 DIAGNOSIS — G629 Polyneuropathy, unspecified: Secondary | ICD-10-CM | POA: Diagnosis not present

## 2017-12-30 DIAGNOSIS — C9000 Multiple myeloma not having achieved remission: Secondary | ICD-10-CM

## 2017-12-30 DIAGNOSIS — M549 Dorsalgia, unspecified: Secondary | ICD-10-CM

## 2017-12-30 DIAGNOSIS — Z5112 Encounter for antineoplastic immunotherapy: Secondary | ICD-10-CM | POA: Diagnosis not present

## 2017-12-30 DIAGNOSIS — Z72820 Sleep deprivation: Secondary | ICD-10-CM

## 2017-12-30 LAB — COMPREHENSIVE METABOLIC PANEL
ALT: 13 U/L (ref 0–44)
AST: 16 U/L (ref 15–41)
Albumin: 3.5 g/dL (ref 3.5–5.0)
Alkaline Phosphatase: 22 U/L — ABNORMAL LOW (ref 38–126)
Anion gap: 7 (ref 5–15)
BUN: 15 mg/dL (ref 8–23)
CO2: 24 mmol/L (ref 22–32)
Calcium: 8.6 mg/dL — ABNORMAL LOW (ref 8.9–10.3)
Chloride: 108 mmol/L (ref 98–111)
Creatinine, Ser: 0.74 mg/dL (ref 0.61–1.24)
GFR calc Af Amer: >60 mL/min (ref >60)
GFR calc non Af Amer: >60 mL/min (ref >60)
Glucose, Bld: 109 mg/dL — ABNORMAL HIGH (ref 70–99)
Potassium: 4.4 mmol/L (ref 3.5–5.1)
Sodium: 139 mmol/L (ref 135–145)
Total Bilirubin: 1 mg/dL (ref 0.3–1.2)
Total Protein: 6.3 g/dL — ABNORMAL LOW (ref 6.5–8.1)

## 2017-12-30 LAB — CBC WITH DIFFERENTIAL/PLATELET
Abs Immature Granulocytes: 0.01 10*3/uL (ref 0.00–0.07)
Basophils Absolute: 0 10*3/uL (ref 0.0–0.1)
Basophils Relative: 0 %
Eosinophils Absolute: 0 10*3/uL (ref 0.0–0.5)
Eosinophils Relative: 1 %
HCT: 42.8 % (ref 39.0–52.0)
Hemoglobin: 13.9 g/dL (ref 13.0–17.0)
Immature Granulocytes: 0 %
Lymphocytes Relative: 18 %
Lymphs Abs: 1.1 10*3/uL (ref 0.7–4.0)
MCH: 30.2 pg (ref 26.0–34.0)
MCHC: 32.5 g/dL (ref 30.0–36.0)
MCV: 92.8 fL (ref 80.0–100.0)
Monocytes Absolute: 0.7 10*3/uL (ref 0.1–1.0)
Monocytes Relative: 11 %
Neutro Abs: 4.4 10*3/uL (ref 1.7–7.7)
Neutrophils Relative %: 70 %
Platelets: 128 10*3/uL — ABNORMAL LOW (ref 150–400)
RBC: 4.61 MIL/uL (ref 4.22–5.81)
RDW: 13.2 % (ref 11.5–15.5)
WBC: 6.3 10*3/uL (ref 4.0–10.5)
nRBC: 0 % (ref 0.0–0.2)

## 2017-12-30 LAB — LACTATE DEHYDROGENASE: LDH: 146 U/L (ref 98–192)

## 2017-12-30 MED ORDER — SODIUM CHLORIDE 0.9 % IV SOLN
15.9000 mg/kg | Freq: Once | INTRAVENOUS | Status: AC
  Administered 2017-12-30: 1800 mg via INTRAVENOUS
  Filled 2017-12-30: qty 10

## 2017-12-30 MED ORDER — OXYCODONE HCL 10 MG PO TABS: 10.0000 mg | ORAL_TABLET | Freq: Three times a day (TID) | ORAL | 0 refills | Status: DC | PRN

## 2017-12-30 MED ORDER — PROCHLORPERAZINE MALEATE 10 MG PO TABS
10.0000 mg | ORAL_TABLET | Freq: Once | ORAL | Status: AC
  Administered 2017-12-30: 10 mg via ORAL
  Filled 2017-12-30: qty 1

## 2017-12-30 MED ORDER — ACETAMINOPHEN 325 MG PO TABS
650.0000 mg | ORAL_TABLET | Freq: Once | ORAL | Status: AC
  Administered 2017-12-30: 650 mg via ORAL
  Filled 2017-12-30: qty 2

## 2017-12-30 MED ORDER — SODIUM CHLORIDE 0.9% FLUSH
10.0000 mL | INTRAVENOUS | Status: DC | PRN
  Administered 2017-12-30: 10 mL
  Filled 2017-12-30: qty 10

## 2017-12-30 MED ORDER — SODIUM CHLORIDE 0.9 % IV SOLN
Freq: Once | INTRAVENOUS | Status: AC
  Administered 2017-12-30: 11:00:00 via INTRAVENOUS

## 2017-12-30 MED ORDER — HEPARIN SOD (PORK) LOCK FLUSH 100 UNIT/ML IV SOLN
500.0000 [IU] | Freq: Once | INTRAVENOUS | Status: AC | PRN
  Administered 2017-12-30: 500 [IU]

## 2017-12-30 MED ORDER — SODIUM CHLORIDE 0.9 % IV SOLN
20.0000 mg | Freq: Once | INTRAVENOUS | Status: AC
  Administered 2017-12-30: 20 mg via INTRAVENOUS
  Filled 2017-12-30: qty 2

## 2017-12-30 MED ORDER — DIPHENHYDRAMINE HCL 25 MG PO CAPS
50.0000 mg | ORAL_CAPSULE | Freq: Once | ORAL | Status: AC
  Administered 2017-12-30: 50 mg via ORAL
  Filled 2017-12-30: qty 2

## 2017-12-30 NOTE — Progress Notes (Signed)
 [facility] [address] , [place] [address]   CLINIC:  Medical Oncology/Hematology  PCP:  Pickard, Warren T, MD [address] [place]  [id number] [phone]   REASON FOR VISIT: Follow-up for multiple myeloma  CURRENT THERAPY: pomalidomide daily, daratumumab (DARZALEX), and dexamethasone every 2 weeks   BRIEF ONCOLOGIC HISTORY:    Multiple myeloma without remission (HCC)   11/16/2012 Initial Diagnosis    L occipital condyle destructive lesion, not biopsied secondary to location. XRT    12/01/2012 Imaging    L3 superior endplate compression FX, no lytic lesions to suggest myeloma    02/06/2014 Imaging    soft tissue mass along the right posterior fifth rib with some rib destruction    02/16/2014 Miscellaneous    Normal CBC, Normal CMP, kappa,lamda ratio of 2.62 (abnormal), UIEP with slightly restricted band, IgG kappa, SPEP/IEP with monoclonal protein at 0.79 g/dl, normal igG, suppressed IGM    02/20/2014 Bone Marrow Biopsy    33% kappa restricted plasma cells Normal FISH, normal cytogenetics    02/21/2014 - 03/08/2014 Radiation Therapy    30Gy to R rib lesion, lesion not biopsied    03/16/2014 PET scan    Scattered hypermetabolic osseous lesions, including the calvarium, ribs, left scapula, sacrum, and left femoral shaft. An expansile left vertebral body lesion at L3 extends into the epidural space of the left lateral recess,     03/17/2014 - 04/07/2014 Chemotherapy    Velcade and Dexamethasone due to initial denial of Revlimid by insurance company.  Zometa monthly.    03/22/2014 Imaging    MR_ L-Spine- Enhancing lesions compatible with multiple myeloma at L2, L3, and S1-2.    04/07/2014 - 07/28/2014 Chemotherapy    RVD with Revlimid at 25 mg days 1-14.  Revlimid dose reduced to 25 mg every other day x 14 days with 7 day respite beginning on day 1 of cycle 2.    04/17/2014 Adverse Reaction    Revlimid-induced rash.  Treated with steroids.    07/28/2014 - 08/04/2014 Chemotherapy    Revlimid daily    08/04/2014 Adverse Reaction    Velcade-induced peripheral neuropathy    08/04/2014 Treatment Plan Change    D/C Velcade    08/11/2014 PET scan    Response to therapy. Improvement and resolution of foci of osseous hypermetabolism.    08/22/2014 - 08/28/2014 Chemotherapy    Revlimid 25 mg days 1-21 every 28 days    08/28/2014 Adverse Reaction    Revlimid-induced rash. Medication held.  Medrol dose Pak prescribed.    09/04/2014 - 01/08/2015 Chemotherapy    Revlimid 10 mg every other day, without dexamethasone    01/08/2015 - 01/15/2015 Hospital Admission    Colonic diverticular abscess with IR drain placement    03/02/2015 PET scan    Only bony uptake is low activity at site of deformity/callus at R second rib FX, high activity in sigmoid colon, advanced sigmoid divertidulosis. Abscess not resolved?    06/04/2015 Imaging    CT nonobstructive L renal calculus, diverticulosis of descending and sigmoid colon without inflammation, moderate prostatic enlargement    07/12/2015 Surgery    Diverticulitis s/p robotic sigmoid colectomy with Dr. Gross    08/23/2015 PET scan    Primarily similar hypermetabolic osseous foci within the R sided ribs. new L third rib focus of hypermetab and sclerosis is favored to be related to healing FX. no soft tissue myeloma id'd. Presumed postop hypermetab and edema about the R pelvic   wall     03/06/2016 PET scan    1. Reduced activity in the prior lesion such as the right second and fifth rib lesions, activity nearly completely resolved and significantly less than added mediastinal blood pool activity. No new lesions are identified. 2. Other imaging findings of potential clinical significance: Coronary, aortic arch, and branch vessel atherosclerotic vascular disease. Aortoiliac atherosclerotic vascular disease. Enlarged prostate gland. Colonic diverticula.    02/17/2017 PET scan    HEAD/NECK: No hypermetabolic  activity in the scalp. No hypermetabolic cervical lymph nodes. CHEST: No hypermetabolic mediastinal or hilar nodes. Right upper lobe scarring/atelectasis. No suspicious pulmonary nodules on the CT scan. ABDOMEN/PELVIS: No abnormal hypermetabolic activity within the liver, pancreas, adrenal glands, or spleen. No hypermetabolic lymph nodes in the abdomen or pelvis. Atherosclerotic calcifications of the abdominal aorta and branch vessels. Left renal sinus cysts. Sigmoid diverticulosis, without evidence of diverticulitis. Prostatomegaly. SKELETON: Vague hypermetabolism involving the inferior sternum, max SUV 3.2, previously 8.2. Focal hypermetabolism involving the left sacrum, max SUV 4.0, previously 6.7. EXTREMITIES: No abnormal hypermetabolic activity in the lower extremities.    02/18/2017 -  Chemotherapy    Bortezomib 1.65m/m2 QWk + Dexamethasone 168mQTue/Fri --Cycle #1, 02/18/17     02/18/2017 - 08/26/2017 Chemotherapy    The patient had bortezomib SQ (VELCADE) chemo injection 3 mg, 1.3 mg/m2 = 3 mg, Subcutaneous,  Once, 7 of 9 cycles Administration: 3 mg (02/18/2017), 3 mg (02/26/2017), 3 mg (03/04/2017), 3 mg (03/11/2017), 3 mg (04/08/2017), 3 mg (03/18/2017), 3 mg (03/25/2017), 3 mg (04/01/2017), 3 mg (05/06/2017), 3 mg (05/20/2017), 3 mg (06/03/2017), 3 mg (06/17/2017), 3 mg (07/01/2017), 3 mg (07/15/2017), 3 mg (07/29/2017), 3 mg (08/12/2017), 3 mg (08/26/2017)  for chemotherapy treatment.     09/14/2017 -  Chemotherapy    The patient had daratumumab (DARZALEX) 1,800 mg in sodium chloride 0.9 % 910 mL (1.8 mg/mL) chemo infusion, 15.9 mg/kg = 1,820 mg, Intravenous, Once, 1 of 1 cycle Administration: 1,800 mg (09/17/2017) daratumumab (DARZALEX) 1,800 mg in sodium chloride 0.9 % 410 mL (3.6 mg/mL) chemo infusion, 1,820 mg, Intravenous, Once, 3 of 7 cycles Administration: 1,800 mg (10/28/2017), 1,800 mg (11/04/2017), 1,800 mg (11/11/2017), 1,800 mg (11/18/2017), 1,800 mg (11/25/2017), 1,800 mg (12/02/2017), 1,800 mg  (12/09/2017), 1,800 mg (12/16/2017)  for chemotherapy treatment.         INTERVAL HISTORY:  [name].o. male returns for routine follow-up for multiple myeloma. He reports his back pain is worst. He is unable to do any activities due to his pain. He is also unable to sleep more than a couple hours a night. He has tried ambein, Trazadone, and restoril and non of these have helped him. He still has night sweats and chills. Denies any fevers. He reports his appetite and energy level at 25% and he is maintaining his weight well.       REVIEW OF SYSTEMS:  Review of Systems  Constitutional: Positive for fatigue.  Musculoskeletal: Positive for back pain.  Psychiatric/Behavioral: Positive for sleep disturbance.  All other systems reviewed and are negative.    PAST MEDICAL/SURGICAL HISTORY:  Past Medical History:  Diagnosis Date  . BPH (benign prostatic hyperplasia)   . Colonic diverticular abscess 01/08/2015  . Diverticulitis 1170/48/88 complicated by abscess and required percutaneous drainage  . GERD (gastroesophageal reflux disease)   . H/O ETOH abuse   . History of chemotherapy last done jan 2017  . History of radiation therapy 01/05/13-02/10/13   45 gray to left occipital condyle  region  . History of radiation therapy 12/01/16-12/10/16   Parasternal nodule, chest- 24 Gy total delivered in 8 fractions, Left sacro-iliac, pelvis- 24 Gy total delivered in 8 fractions   . History of radiation therapy 02/19/17-02/22/17   right temporal scalp 30 Gy in 10 fractions  . Intra-abdominal abscess (HCC)   . Multiple myeloma (HCC) 2015  . Neuropathy   . Radiation 02/21/14-03/08/14   right posterior chest wall area 30 gray  . Radiation 04/19/14-05/02/14   lumbar spine 25 gray  . Skull lesion    Left occipital condyle  . Wrist fracture, left    x 2   Past Surgical History:  Procedure Laterality Date  . COLONOSCOPY N/A 04/19/2015   Procedure: COLONOSCOPY;  Surgeon: Sandi L Fields, MD;   Location: [facility];  Service: Endoscopy;  Laterality: N/A;  1:30 PM  . CYST EXCISION  1959   tail bone  . IR IMAGING GUIDED PORT INSERTION  09/25/2017     SOCIAL HISTORY:  Social History   Socioeconomic History  . Marital status: Married    Spouse name: Not on file  . Number of children: 3  . Years of education: Not on file  . Highest education level: Not on file  Occupational History    Employer: RETIRED  Social Needs  . Financial resource strain: Not on file  . Food insecurity:    Worry: Not on file    Inability: Not on file  . Transportation needs:    Medical: Not on file    Non-medical: Not on file  Tobacco Use  . Smoking status: Never Smoker  . Smokeless tobacco: Never Used  Substance and Sexual Activity  . Alcohol use: No    Comment: " Not much no more"  . Drug use: No  . Sexual activity: Not on file  Lifestyle  . Physical activity:    Days per week: Not on file    Minutes per session: Not on file  . Stress: Not on file  Relationships  . Social connections:    Talks on phone: Not on file    Gets together: Not on file    Attends religious service: Not on file    Active member of club or organization: Not on file    Attends meetings of clubs or organizations: Not on file    Relationship status: Not on file  . Intimate partner violence:    Fear of current or ex partner: Not on file    Emotionally abused: Not on file    Physically abused: Not on file    Forced sexual activity: Not on file  Other Topics Concern  . Not on file  Social History Narrative  . Not on file    FAMILY HISTORY:  Family History  Problem Relation Age of Onset  . Diabetes Mother   . Stroke Mother   . Hypertension Father     CURRENT MEDICATIONS:  Outpatient Encounter Medications as of 12/30/2017  Medication Sig Note  . acyclovir (ZOVIRAX) 400 MG tablet Take 1 tablet (400 mg total) by mouth 2 (two) times daily.   . ALPRAZolam (XANAX) 1 MG tablet TAKE 2 TABLETS BY MOUTH AT  BEDTIME AS NEEDED FOR ANXIETY OR SLEEP.   . aspirin EC 325 MG tablet Take 1 tablet (325 mg total) by mouth daily.   . CALCIUM-VITAMIN D PO Take 1 tablet by mouth 2 (two) times daily.    . dexamethasone (DECADRON) 4 MG tablet Take 2.5 tablets (10 mg   total) by mouth once a week. Take 10 mg once weekly 09/18/2017: Started on 09/18/17  . furosemide (LASIX) 20 MG tablet TAKE 1 TABLET BY MOUTH DAILY AS NEEDED FOR EDEMA.   Marland Kitchen gabapentin (NEURONTIN) 400 MG capsule Take 1 capsule (400 mg total) by mouth 2 (two) times daily.   Marland Kitchen HYDROcodone-acetaminophen (NORCO/VICODIN) 5-325 MG tablet TAKE 1 TABLET BY MOUTH EVERY 6 HOURS AS NEEDED FOR MODERATE PAIN   . lidocaine-prilocaine (EMLA) cream Apply small amount over port one (1) hour prior to appointment.   Marland Kitchen MAGNESIUM PO Take 1 tablet by mouth at bedtime.   Marland Kitchen omeprazole (PRILOSEC) 20 MG capsule Take 1 capsule (20 mg total) by mouth daily.   . Oxycodone HCl 10 MG TABS Take 1 tablet (10 mg total) by mouth every 8 (eight) hours as needed.   . pomalidomide (POMALYST) 2 MG capsule Take 1 capsule (2 mg total) by mouth every other day.   . predniSONE (DELTASONE) 20 MG tablet Take 20 mg by mouth daily with breakfast. Take 60 mg by mouth for 5 days. 09/24/2017: Taking 60 mg by mouth for 5 days  . tamsulosin (FLOMAX) 0.4 MG CAPS capsule Take 1 capsule (0.4 mg total) by mouth at bedtime.   . temazepam (RESTORIL) 15 MG capsule TAKE 1 CAPSULE BY MOUTH AT BEDTIME AS NEEDED FOR SLEEP   . traZODone (DESYREL) 100 MG tablet Take 1 tablet (100 mg total) by mouth at bedtime.   Marland Kitchen zolendronic acid (ZOMETA) 4 MG/5ML injection Inject 4 mg into the vein every 30 (thirty) days.    . [DISCONTINUED] Oxycodone HCl 10 MG TABS Take 1 tablet (10 mg total) by mouth every 8 (eight) hours as needed.    No facility-administered encounter medications on file as of 12/30/2017.     ALLERGIES:  Allergies  Allergen Reactions  . Codeine Other (See Comments)    Headache  . Morphine And Related Other  (See Comments)    Makes him feel weird  . Revlimid [Lenalidomide] Other (See Comments)    "Causes me to become weak"     PHYSICAL EXAM:  ECOG Performance status: 1  Vitals:   12/30/17 0943  BP: (!) 160/76  Pulse: 89  Resp: 18  Temp: (!) 97.5 F (36.4 C)  SpO2: 97%   Filed Weights   12/30/17 0943  Weight: 242 lb 12.8 oz (110.1 kg)    Physical Exam  Constitutional: He is oriented to person, place, and time. He appears well-developed and well-nourished.  Musculoskeletal:  Walks with cane  Neurological: He is alert and oriented to person, place, and time.  Skin: Skin is warm and dry.  Psychiatric: He has a normal mood and affect. His behavior is normal. Judgment and thought content normal.     LABORATORY DATA:  I have reviewed the labs as listed.  CBC    Component Value Date/Time   WBC 6.3 12/30/2017 0923   RBC 4.61 12/30/2017 0923   HGB 13.9 12/30/2017 0923   HCT 42.8 12/30/2017 0923   PLT 128 (L) 12/30/2017 0923   MCV 92.8 12/30/2017 0923   MCH 30.2 12/30/2017 0923   MCHC 32.5 12/30/2017 0923   RDW 13.2 12/30/2017 0923   LYMPHSABS 1.1 12/30/2017 0923   MONOABS 0.7 12/30/2017 0923   EOSABS 0.0 12/30/2017 0923   BASOSABS 0.0 12/30/2017 0923   CMP Latest Ref Rng & Units 12/30/2017 12/16/2017 12/09/2017  Glucose 70 - 99 mg/dL 109(H) 108(H) 102(H)  BUN 8 - 23 mg/dL 15 17 17  Creatinine 0.61 - 1.24 mg/dL 0.74 0.69 0.70  Sodium 135 - 145 mmol/L 139 138 139  Potassium 3.5 - 5.1 mmol/L 4.4 4.2 4.1  Chloride 98 - 111 mmol/L 108 108 107  CO2 22 - 32 mmol/L _0 Calcium 8.9 - 10.3 mg/dL 8.6(L) 8.5(L) 8.4(L)  Total Protein 6.5 - 8.1 g/dL 6.3(L) 5.9(L) 6.3(L)  Total Bilirubin 0.3 - 1.2 mg/dL 1.0 0.4 0.7  Alkaline Phos 38 - 126 U/L 22(L) 24(L) 24(L)  AST 15 - 41 U/L _1 ALT 0 - 44 U/L _2 DIAGNOSTIC IMAGING:  I have independently reviewed the scans and discussed with the patient.   I have reviewed Francene Finders, NP's note and agree with  the documentation.  I personally performed a face-to-face visit, made revisions and my assessment and plan is as follows.     ASSESSMENT & PLAN:   Multiple myeloma without remission (HCC) 1.  IgG kappa multiple myeloma: - Initially treated with RVD from 04/07/2014 through 07/28/2014, placed on maintenance Revlimid, which was discontinued 1 and half year ago, unknown reasons, patient thinks it was discontinued when he was receiving radiation. -MRI in January of the brain showed new right temporal lesion, status post radiation therapy, PET/CT scan in January 2019 showed improvement of multiple myeloma with no new lesions, started on weekly Velcade and dexamethasone on 02/18/2017, status post 2 cycles, Velcade changed to every other week on 05/06/2017 secondary to neuropathy, takes dexamethasone 20 mg on days of Velcade. - We reviewed the results of the M spike which was stable at 0.3.  Free light chain ratio went up to 4. -Because of persistent back pain, I have done an MRI of the thoracic and lumbar spine.  We discussed the results of the scans today.  He had a new rib lesion close to the spine on the left side, possibly eighth rib.  There is a soft tissue mass in that area.  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  There is also chronic L4 compression fracture which is stable. - Velcade was discontinued secondary to progression.  I have recommended a much stronger regimen containing daratumumab, pomalidomide and dexamethasone.  Daratumumab will be given weekly for the first 8 weeks.  Pomalidomide will be started at a reduced dose of 2 mg, 21 days on, 7 days off.  Dexamethasone will be taken at a flat dose of 10 mg on a weekly basis.  We talked about the side effects of this regimen in detail. -DPd started on 09/17/2017.  He developed erythematous maculopapular rash involving the trunk after taking 3 pills of pomalidomide.  It improved after steroids.  (Pomalidomide dose is 2 mg 3  weeks on 1 week off, dexamethasone doses 10 mg weekly). -Status post XRT to the left eighth rib lesion, 30 Gy in 10 fractions from 09/29/2017 through 10/14/2017. - Daratumumab restarted after radiation on 10/28/2017. - M spike on 11/25/2017 was 0.3 g.  He was started on every 2 weekly Darzalex on 12/16/2017.  He is currently taking pomalidomide 2 mg every other day.  He did not have any rash.  Hence I have told him to increase pomalidomide to 2 mg every day.  He will continue it until 01/06/2018 and stop for a week. -I will reevaluate him in 2 weeks.  I plan to repeat myeloma panel at that time.  2.  Peripheral neuropathy: He will continue Neurontin 3 times a day for  his numbness in the feet and legs.   3.  Bone protection: -He will continue Zometa every 8 weeks.  He will continue calcium and vitamin D.  4.  Back pain: -This is from a combination of myeloma and degenerative disc disease.  He complains of worsening lower back pain going down both his thighs.  I have recommended obtaining MRI of the lumbar spine.  He does have L4 vertebral fracture and spinal stenosis on the previous scan. -She is taking oxycodone 10 mg 2 tablets daily.  I have told him to increase it to 3 times a day.  5.  Sleep: - He has used Restoril and trazodone without help.  He thinks that he already used Ambien also without help. -He reports that he takes half a tablet of Xanax with gabapentin and it helps him to sleep.  He will continue that.      Orders placed this encounter:  Orders Placed This Encounter  Procedures  . MR Lumbar Spine W Wo Contrast  . Lactate dehydrogenase  . Protein electrophoresis, serum  . Kappa/lambda light chains  . CBC with Differential/Platelet  . Comprehensive metabolic panel      Sreedhar Katragadda, MD [facility] [phone]  

## 2017-12-30 NOTE — Patient Instructions (Addendum)
[place] at [facility] Discharge Instructions   Follow up in 2 weeks with MRI, treatment and Labs.   Thank you for choosing [place] at [facility] to provide your oncology and hematology care.  To afford each patient quality time with our provider, please arrive at least 15 minutes before your scheduled appointment time.   If you have a lab appointment with the [place] please come in thru the  Main Entrance and check in at the main information desk  You need to re-schedule your appointment should you arrive 10 or more minutes late.  We strive to give you quality time with our providers, and arriving late affects you and other patients whose appointments are after yours.  Also, if you no show three or more times for appointments you may be dismissed from the clinic at the providers discretion.     Again, thank you for choosing [facility].  Our hope is that these requests will decrease the amount of time that you wait before being seen by our physicians.       _____________________________________________________________  Should you have questions after your visit to [facility], please contact our office at [phone] between the hours of 8:00 a.m. and 4:30 p.m.  Voicemails left after 4:00 p.m. will not be returned until the following business day.  For prescription refill requests, have your pharmacy contact our office and allow 72 hours.    Cancer Center Support Programs:   > Cancer Support Group  2nd Tuesday of the month 1pm-2pm, Journey Room

## 2017-12-30 NOTE — Progress Notes (Signed)
Treatment given per orders. Patient tolerated it well without problems. Vitals stable and discharged home from clinic ambulatory. Follow up as scheduled.  

## 2017-12-30 NOTE — Progress Notes (Signed)
Pt here today for Daratumumab. VSS. BP slightly elevated. Complaints of not being able to sleep. Hip and back pain noted which pt rates an 8. Labs reviewed.

## 2017-12-30 NOTE — Assessment & Plan Note (Signed)
1.  IgG kappa multiple myeloma: - Initially treated with RVD from 04/07/2014 through 07/28/2014, placed on maintenance Revlimid, which was discontinued 1 and half year ago, unknown reasons, patient thinks it was discontinued when he was receiving radiation. -MRI in January of the brain showed new right temporal lesion, status post radiation therapy, PET/CT scan in January 2019 showed improvement of multiple myeloma with no new lesions, started on weekly Velcade and dexamethasone on 02/18/2017, status post 2 cycles, Velcade changed to every other week on 05/06/2017 secondary to neuropathy, takes dexamethasone 20 mg on days of Velcade. - We reviewed the results of the M spike which was stable at 0.3.  Free light chain ratio went up to 4. -Because of persistent back pain, I have done an MRI of the thoracic and lumbar spine.  We discussed the results of the scans today.  He had a new rib lesion close to the spine on the left side, possibly eighth rib.  There is a soft tissue mass in that area.  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  There is also chronic L4 compression fracture which is stable. - Velcade was discontinued secondary to progression.  I have recommended a much stronger regimen containing daratumumab, pomalidomide and dexamethasone.  Daratumumab will be given weekly for the first 8 weeks.  Pomalidomide will be started at a reduced dose of 2 mg, 21 days on, 7 days off.  Dexamethasone will be taken at a flat dose of 10 mg on a weekly basis.  We talked about the side effects of this regimen in detail. -DPd started on 09/17/2017.  He developed erythematous maculopapular rash involving the trunk after taking 3 pills of pomalidomide.  It improved after steroids.  (Pomalidomide dose is 2 mg 3 weeks on 1 week off, dexamethasone doses 10 mg weekly). -Status post XRT to the left eighth rib lesion, 30 Gy in 10 fractions from 09/29/2017 through 10/14/2017. - Daratumumab restarted after  radiation on 10/28/2017. - M spike on 11/25/2017 was 0.3 g.  He was started on every 2 weekly Darzalex on 12/16/2017.  He is currently taking pomalidomide 2 mg every other day.  He did not have any rash.  Hence I have told him to increase pomalidomide to 2 mg every day.  He will continue it until 01/06/2018 and stop for a week. -I will reevaluate him in 2 weeks.  I plan to repeat myeloma panel at that time.  2.  Peripheral neuropathy: He will continue Neurontin 3 times a day for his numbness in the feet and legs.   3.  Bone protection: -He will continue Zometa every 8 weeks.  He will continue calcium and vitamin D.  4.  Back pain: -This is from a combination of myeloma and degenerative disc disease.  He complains of worsening lower back pain going down both his thighs.  I have recommended obtaining MRI of the lumbar spine.  He does have L4 vertebral fracture and spinal stenosis on the previous scan. -She is taking oxycodone 10 mg 2 tablets daily.  I have told him to increase it to 3 times a day.  5.  Sleep: - He has used Restoril and trazodone without help.  He thinks that he already used Ambien also without help. -He reports that he takes half a tablet of Xanax with gabapentin and it helps him to sleep.  He will continue that.

## 2017-12-30 NOTE — Patient Instructions (Signed)
[facility] Discharge Instructions for Patients Receiving Chemotherapy   Beginning January 23rd 2017 lab work for the Cancer Center will be done in the  Main lab at [place] on 1st floor. If you have a lab appointment with the Cancer Center please come in thru the  Main Entrance and check in at the main information desk   Today you received the following chemotherapy agents   To help prevent nausea and vomiting after your treatment, we encourage you to take your nausea medication     If you develop nausea and vomiting, or diarrhea that is not controlled by your medication, call the clinic.  The clinic phone number is [phone]. Office hours are Monday-Friday 8:30am-5:00pm.  BELOW ARE SYMPTOMS THAT SHOULD BE REPORTED IMMEDIATELY:  *FEVER GREATER THAN 101.0 F  *CHILLS WITH OR WITHOUT FEVER  NAUSEA AND VOMITING THAT IS NOT CONTROLLED WITH YOUR NAUSEA MEDICATION  *UNUSUAL SHORTNESS OF BREATH  *UNUSUAL BRUISING OR BLEEDING  TENDERNESS IN MOUTH AND THROAT WITH OR WITHOUT PRESENCE OF ULCERS  *URINARY PROBLEMS  *BOWEL PROBLEMS  UNUSUAL RASH Items with * indicate a potential emergency and should be followed up as soon as possible. If you have an emergency after office hours please contact your primary care physician or go to the nearest emergency department.  Please call the clinic during office hours if you have any questions or concerns.   You may also contact the Patient Navigator at [phone] should you have any questions or need assistance in obtaining follow up care.      Resources For Cancer Patients and their Caregivers ? American Cancer Society: Can assist with transportation, wigs, general needs, runs Look Good Feel Better.        1-888-227-6333 ? Cancer Care: Provides financial assistance, online support groups, medication/co-pay assistance.  1-800-813-HOPE (4673) ? [facility] Assists [place] cancer  patients and their families through emotional , educational and financial support.  336-427-4357 ? [place] DSS Where to apply for food stamps, Medicaid and utility assistance. 336-342-1394 ? [facility]: Transportation to medical appointments. 336-347-2287 ? Social Security Administration: May apply for disability if have a Stage IV cancer. 336-342-7796 1-800-772-1213 ? [place] Aging, Disability and Transit Services: Assists with nutrition, care and transit needs. 336-349-2343         

## 2018-01-08 ENCOUNTER — Other Ambulatory Visit (HOSPITAL_COMMUNITY): Payer: Self-pay | Admitting: *Deleted

## 2018-01-08 DIAGNOSIS — C9000 Multiple myeloma not having achieved remission: Secondary | ICD-10-CM

## 2018-01-08 MED ORDER — POMALIDOMIDE 2 MG PO CAPS: 2.0000 mg | ORAL_CAPSULE | ORAL | 0 refills | Status: DC

## 2018-01-11 ENCOUNTER — Ambulatory Visit (HOSPITAL_COMMUNITY)
Admission: RE | Admit: 2018-01-11 | Discharge: 2018-01-11 | Disposition: A | Discharge status: Home / Self Care | Payer: Medicare Other | Source: Ambulatory Visit | Attending: Nurse Practitioner | Admitting: Nurse Practitioner

## 2018-01-11 DIAGNOSIS — M47816 Spondylosis without myelopathy or radiculopathy, lumbar region: Secondary | ICD-10-CM | POA: Diagnosis not present

## 2018-01-11 DIAGNOSIS — M899 Disorder of bone, unspecified: Secondary | ICD-10-CM | POA: Diagnosis not present

## 2018-01-11 DIAGNOSIS — M5117 Intervertebral disc disorders with radiculopathy, lumbosacral region: Secondary | ICD-10-CM | POA: Diagnosis not present

## 2018-01-11 DIAGNOSIS — M48061 Spinal stenosis, lumbar region without neurogenic claudication: Secondary | ICD-10-CM | POA: Insufficient documentation

## 2018-01-11 DIAGNOSIS — C9000 Multiple myeloma not having achieved remission: Secondary | ICD-10-CM | POA: Insufficient documentation

## 2018-01-11 DIAGNOSIS — M545 Low back pain: Secondary | ICD-10-CM | POA: Diagnosis present

## 2018-01-11 DIAGNOSIS — M5126 Other intervertebral disc displacement, lumbar region: Secondary | ICD-10-CM | POA: Insufficient documentation

## 2018-01-11 DIAGNOSIS — M4727 Other spondylosis with radiculopathy, lumbosacral region: Secondary | ICD-10-CM | POA: Diagnosis not present

## 2018-01-11 MED ORDER — GADOBUTROL 1 MMOL/ML IV SOLN
10.0000 mL | Freq: Once | INTRAVENOUS | Status: AC | PRN
  Administered 2018-01-11: 10 mL via INTRAVENOUS

## 2018-01-13 ENCOUNTER — Encounter (HOSPITAL_COMMUNITY): Payer: Self-pay | Admitting: Hematology

## 2018-01-13 ENCOUNTER — Inpatient Hospital Stay (HOSPITAL_BASED_OUTPATIENT_CLINIC_OR_DEPARTMENT_OTHER): Payer: Medicare Other | Admitting: Hematology

## 2018-01-13 ENCOUNTER — Inpatient Hospital Stay (HOSPITAL_COMMUNITY): Payer: Medicare Other | Attending: Hematology

## 2018-01-13 ENCOUNTER — Inpatient Hospital Stay (HOSPITAL_COMMUNITY): Payer: Medicare Other

## 2018-01-13 VITALS — BP 121/53 | HR 57 | Temp 97.5°F | Resp 18 | Wt 244.0 lb

## 2018-01-13 DIAGNOSIS — G629 Polyneuropathy, unspecified: Secondary | ICD-10-CM

## 2018-01-13 DIAGNOSIS — R51 Headache: Secondary | ICD-10-CM

## 2018-01-13 DIAGNOSIS — C9000 Multiple myeloma not having achieved remission: Secondary | ICD-10-CM

## 2018-01-13 DIAGNOSIS — R233 Spontaneous ecchymoses: Secondary | ICD-10-CM

## 2018-01-13 DIAGNOSIS — Z72820 Sleep deprivation: Secondary | ICD-10-CM

## 2018-01-13 DIAGNOSIS — M549 Dorsalgia, unspecified: Secondary | ICD-10-CM

## 2018-01-13 DIAGNOSIS — Z5112 Encounter for antineoplastic immunotherapy: Secondary | ICD-10-CM | POA: Diagnosis not present

## 2018-01-13 LAB — CBC WITH DIFFERENTIAL/PLATELET
Abs Immature Granulocytes: 0.01 10*3/uL (ref 0.00–0.07)
Basophils Absolute: 0 10*3/uL (ref 0.0–0.1)
Basophils Relative: 1 %
Eosinophils Absolute: 0.1 10*3/uL (ref 0.0–0.5)
Eosinophils Relative: 2 %
HCT: 43.1 % (ref 39.0–52.0)
Hemoglobin: 13.9 g/dL (ref 13.0–17.0)
Immature Granulocytes: 0 %
Lymphocytes Relative: 36 %
Lymphs Abs: 1.1 10*3/uL (ref 0.7–4.0)
MCH: 30.1 pg (ref 26.0–34.0)
MCHC: 32.3 g/dL (ref 30.0–36.0)
MCV: 93.3 fL (ref 80.0–100.0)
Monocytes Absolute: 0.6 10*3/uL (ref 0.1–1.0)
Monocytes Relative: 19 %
Neutro Abs: 1.4 10*3/uL — ABNORMAL LOW (ref 1.7–7.7)
Neutrophils Relative %: 42 %
Platelets: 110 10*3/uL — ABNORMAL LOW (ref 150–400)
RBC: 4.62 MIL/uL (ref 4.22–5.81)
RDW: 13.6 % (ref 11.5–15.5)
WBC: 3.2 10*3/uL — ABNORMAL LOW (ref 4.0–10.5)
nRBC: 0 % (ref 0.0–0.2)

## 2018-01-13 LAB — COMPREHENSIVE METABOLIC PANEL
ALT: 15 U/L (ref 0–44)
AST: 17 U/L (ref 15–41)
Albumin: 3.5 g/dL (ref 3.5–5.0)
Alkaline Phosphatase: 21 U/L — ABNORMAL LOW (ref 38–126)
Anion gap: 7 (ref 5–15)
BUN: 13 mg/dL (ref 8–23)
CO2: 28 mmol/L (ref 22–32)
Calcium: 9.3 mg/dL (ref 8.9–10.3)
Chloride: 105 mmol/L (ref 98–111)
Creatinine, Ser: 0.79 mg/dL (ref 0.61–1.24)
GFR calc Af Amer: >60 mL/min (ref >60)
GFR calc non Af Amer: >60 mL/min (ref >60)
Glucose, Bld: 110 mg/dL — ABNORMAL HIGH (ref 70–99)
Potassium: 4.3 mmol/L (ref 3.5–5.1)
Sodium: 140 mmol/L (ref 135–145)
Total Bilirubin: 0.6 mg/dL (ref 0.3–1.2)
Total Protein: 6.1 g/dL — ABNORMAL LOW (ref 6.5–8.1)

## 2018-01-13 LAB — LACTATE DEHYDROGENASE: LDH: 130 U/L (ref 98–192)

## 2018-01-13 MED ORDER — HEPARIN SOD (PORK) LOCK FLUSH 100 UNIT/ML IV SOLN
500.0000 [IU] | Freq: Once | INTRAVENOUS | Status: AC | PRN
  Administered 2018-01-13: 500 [IU]

## 2018-01-13 MED ORDER — SODIUM CHLORIDE 0.9 % IV SOLN
Freq: Once | INTRAVENOUS | Status: AC
  Administered 2018-01-13: 10:00:00 via INTRAVENOUS

## 2018-01-13 MED ORDER — PROCHLORPERAZINE MALEATE 10 MG PO TABS
10.0000 mg | ORAL_TABLET | Freq: Once | ORAL | Status: AC
  Administered 2018-01-13: 10 mg via ORAL
  Filled 2018-01-13: qty 1

## 2018-01-13 MED ORDER — SODIUM CHLORIDE 0.9 % IV SOLN
1800.0000 mg | Freq: Once | INTRAVENOUS | Status: AC
  Administered 2018-01-13: 1800 mg via INTRAVENOUS
  Filled 2018-01-13: qty 80

## 2018-01-13 MED ORDER — SODIUM CHLORIDE 0.9% FLUSH
10.0000 mL | INTRAVENOUS | Status: DC | PRN
  Administered 2018-01-13: 10 mL
  Filled 2018-01-13: qty 10

## 2018-01-13 MED ORDER — DIPHENHYDRAMINE HCL 25 MG PO CAPS
50.0000 mg | ORAL_CAPSULE | Freq: Once | ORAL | Status: AC
  Administered 2018-01-13: 50 mg via ORAL
  Filled 2018-01-13: qty 2

## 2018-01-13 MED ORDER — POMALIDOMIDE 2 MG PO CAPS: 2.0000 mg | ORAL_CAPSULE | Freq: Every day | ORAL | 0 refills | Status: DC

## 2018-01-13 MED ORDER — SODIUM CHLORIDE 0.9 % IV SOLN
20.0000 mg | Freq: Once | INTRAVENOUS | Status: AC
  Administered 2018-01-13: 20 mg via INTRAVENOUS
  Filled 2018-01-13: qty 2

## 2018-01-13 MED ORDER — ACETAMINOPHEN 325 MG PO TABS
650.0000 mg | ORAL_TABLET | Freq: Once | ORAL | Status: AC
  Administered 2018-01-13: 650 mg via ORAL
  Filled 2018-01-13: qty 2

## 2018-01-13 NOTE — Progress Notes (Signed)
 [facility] [address] [place], [place] [address]   CLINIC:  Medical Oncology/Hematology  PCP:  Pickard, Warren T, MD [address] [place] [address] [phone]   REASON FOR VISIT: Follow-up for multiple myeloma  CURRENT THERAPY: pomalidomide daily, daratumumab (DARZALEX), and dexamethasone every 2 weeks   BRIEF ONCOLOGIC HISTORY:    Multiple myeloma without remission (HCC)   11/16/2012 Initial Diagnosis    L occipital condyle destructive lesion, not biopsied secondary to location. XRT    12/01/2012 Imaging    L3 superior endplate compression FX, no lytic lesions to suggest myeloma    02/06/2014 Imaging    soft tissue mass along the right posterior fifth rib with some rib destruction    02/16/2014 Miscellaneous    Normal CBC, Normal CMP, kappa,lamda ratio of 2.62 (abnormal), UIEP with slightly restricted band, IgG kappa, SPEP/IEP with monoclonal protein at 0.79 g/dl, normal igG, suppressed IGM    02/20/2014 Bone Marrow Biopsy    33% kappa restricted plasma cells Normal FISH, normal cytogenetics    02/21/2014 - 03/08/2014 Radiation Therapy    30Gy to R rib lesion, lesion not biopsied    03/16/2014 PET scan    Scattered hypermetabolic osseous lesions, including the calvarium, ribs, left scapula, sacrum, and left femoral shaft. An expansile left vertebral body lesion at L3 extends into the epidural space of the left lateral recess,     03/17/2014 - 04/07/2014 Chemotherapy    Velcade and Dexamethasone due to initial denial of Revlimid by insurance company.  Zometa monthly.    03/22/2014 Imaging    MR_ L-Spine- Enhancing lesions compatible with multiple myeloma at L2, L3, and S1-2.    04/07/2014 - 07/28/2014 Chemotherapy    RVD with Revlimid at 25 mg days 1-14.  Revlimid dose reduced to 25 mg every other day x 14 days with 7 day respite beginning on day 1 of cycle 2.    04/17/2014 Adverse Reaction    Revlimid-induced rash.  Treated with steroids.    07/28/2014 - 08/04/2014 Chemotherapy    Revlimid daily    08/04/2014 Adverse Reaction    Velcade-induced peripheral neuropathy    08/04/2014 Treatment Plan Change    D/C Velcade    08/11/2014 PET scan    Response to therapy. Improvement and resolution of foci of osseous hypermetabolism.    08/22/2014 - 08/28/2014 Chemotherapy    Revlimid 25 mg days 1-21 every 28 days    08/28/2014 Adverse Reaction    Revlimid-induced rash. Medication held.  Medrol dose Pak prescribed.    09/04/2014 - 01/08/2015 Chemotherapy    Revlimid 10 mg every other day, without dexamethasone    01/08/2015 - 01/15/2015 Hospital Admission    Colonic diverticular abscess with IR drain placement    03/02/2015 PET scan    Only bony uptake is low activity at site of deformity/callus at R second rib FX, high activity in sigmoid colon, advanced sigmoid divertidulosis. Abscess not resolved?    06/04/2015 Imaging    CT nonobstructive L renal calculus, diverticulosis of descending and sigmoid colon without inflammation, moderate prostatic enlargement    07/12/2015 Surgery    Diverticulitis s/p robotic sigmoid colectomy with Dr. Gross    08/23/2015 PET scan    Primarily similar hypermetabolic osseous foci within the R sided ribs. new L third rib focus of hypermetab and sclerosis is favored to be related to healing FX. no soft tissue myeloma id'd. Presumed postop hypermetab and edema about the R pelvic   wall     03/06/2016 PET scan    1. Reduced activity in the prior lesion such as the right second and fifth rib lesions, activity nearly completely resolved and significantly less than added mediastinal blood pool activity. No new lesions are identified. 2. Other imaging findings of potential clinical significance: Coronary, aortic arch, and branch vessel atherosclerotic vascular disease. Aortoiliac atherosclerotic vascular disease. Enlarged prostate gland. Colonic diverticula.    02/17/2017 PET scan    HEAD/NECK: No hypermetabolic  activity in the scalp. No hypermetabolic cervical lymph nodes. CHEST: No hypermetabolic mediastinal or hilar nodes. Right upper lobe scarring/atelectasis. No suspicious pulmonary nodules on the CT scan. ABDOMEN/PELVIS: No abnormal hypermetabolic activity within the liver, pancreas, adrenal glands, or spleen. No hypermetabolic lymph nodes in the abdomen or pelvis. Atherosclerotic calcifications of the abdominal aorta and branch vessels. Left renal sinus cysts. Sigmoid diverticulosis, without evidence of diverticulitis. Prostatomegaly. SKELETON: Vague hypermetabolism involving the inferior sternum, max SUV 3.2, previously 8.2. Focal hypermetabolism involving the left sacrum, max SUV 4.0, previously 6.7. EXTREMITIES: No abnormal hypermetabolic activity in the lower extremities.    02/18/2017 -  Chemotherapy    Bortezomib 1.'3mg'$ /m2 QWk + Dexamethasone '10mg'$  QTue/Fri --Cycle #1, 02/18/17     02/18/2017 - 08/26/2017 Chemotherapy    The patient had bortezomib SQ (VELCADE) chemo injection 3 mg, 1.3 mg/m2 = 3 mg, Subcutaneous,  Once, 7 of 9 cycles Administration: 3 mg (02/18/2017), 3 mg (02/26/2017), 3 mg (03/04/2017), 3 mg (03/11/2017), 3 mg (04/08/2017), 3 mg (03/18/2017), 3 mg (03/25/2017), 3 mg (04/01/2017), 3 mg (05/06/2017), 3 mg (05/20/2017), 3 mg (06/03/2017), 3 mg (06/17/2017), 3 mg (07/01/2017), 3 mg (07/15/2017), 3 mg (07/29/2017), 3 mg (08/12/2017), 3 mg (08/26/2017)  for chemotherapy treatment.     09/14/2017 -  Chemotherapy    The patient had daratumumab (DARZALEX) 1,800 mg in sodium chloride 0.9 % 910 mL (1.8 mg/mL) chemo infusion, 15.9 mg/kg = 1,820 mg, Intravenous, Once, 1 of 1 cycle Administration: 1,800 mg (09/17/2017) daratumumab (DARZALEX) 1,800 mg in sodium chloride 0.9 % 410 mL (3.6 mg/mL) chemo infusion, 1,820 mg, Intravenous, Once, 4 of 7 cycles Administration: 1,800 mg (10/28/2017), 1,800 mg (11/04/2017), 1,800 mg (11/11/2017), 1,800 mg (11/18/2017), 1,800 mg (11/25/2017), 1,800 mg (12/02/2017), 1,800 mg  (12/09/2017), 1,800 mg (12/16/2017), 1,800 mg (12/30/2017), 1,800 mg (01/13/2018)  for chemotherapy treatment.       INTERVAL HISTORY:  Dean [name] 82 y.o. male returns for routine follow-up for multiple myeloma. He is here today with his wife. He has been experiencing fatigue and chills. He has had more frequent headaches and easy bruising. He is having more bone pain and had to increase his pain medication for a couple of days. He denies any nausea, vomiting, or diarrhea. Denies any fevers or recent infections. Denies bleeding. He reports his appetite and energy level at 25%. He is maintaining his weight well.     REVIEW OF SYSTEMS:  Review of Systems  Constitutional: Positive for chills and fatigue.  Musculoskeletal: Positive for back pain.  Neurological: Positive for headaches.  Hematological: Bruises/bleeds easily.  Psychiatric/Behavioral: Positive for sleep disturbance.  All other systems reviewed and are negative.    PAST MEDICAL/SURGICAL HISTORY:  Past Medical History:  Diagnosis Date  . BPH (benign prostatic hyperplasia)   . Colonic diverticular abscess 01/08/2015  . Diverticulitis 52/84/13   complicated by abscess and required percutaneous drainage  . GERD (gastroesophageal reflux disease)   . H/O ETOH abuse   . History of chemotherapy last done jan 2017  .  History of radiation therapy 01/05/13-02/10/13   45 gray to left occipital condyle region  . History of radiation therapy 12/01/16-12/10/16   Parasternal nodule, chest- 24 Gy total delivered in 8 fractions, Left sacro-iliac, pelvis- 24 Gy total delivered in 8 fractions   . History of radiation therapy 02/19/17-02/22/17   right temporal scalp 30 Gy in 10 fractions  . Intra-abdominal abscess ([place])   . Multiple myeloma ([place]) 2015  . Neuropathy   . Radiation 02/21/14-03/08/14   right posterior chest wall area 30 gray  . Radiation 04/19/14-05/02/14   lumbar spine 25 gray  . Skull lesion    Left occipital condyle  . Wrist  fracture, left    x 2   Past Surgical History:  Procedure Laterality Date  . COLONOSCOPY N/A 04/19/2015   Procedure: COLONOSCOPY;  Surgeon: Danie Binder, MD;  Location: [facility];  Service: Endoscopy;  Laterality: N/A;  1:30 PM  . CYST EXCISION  1959   tail bone  . IR IMAGING GUIDED PORT INSERTION  09/25/2017     SOCIAL HISTORY:  Social History   Socioeconomic History  . Marital status: Married    Spouse name: Not on file  . Number of children: 3  . Years of education: Not on file  . Highest education level: Not on file  Occupational History    Employer: RETIRED  Social Needs  . Financial resource strain: Not on file  . Food insecurity:    Worry: Not on file    Inability: Not on file  . Transportation needs:    Medical: Not on file    Non-medical: Not on file  Tobacco Use  . Smoking status: Never Smoker  . Smokeless tobacco: Never Used  Substance and Sexual Activity  . Alcohol use: No    Comment: " Not much no more"  . Drug use: No  . Sexual activity: Not on file  Lifestyle  . Physical activity:    Days per week: Not on file    Minutes per session: Not on file  . Stress: Not on file  Relationships  . Social connections:    Talks on phone: Not on file    Gets together: Not on file    Attends religious service: Not on file    Active member of club or organization: Not on file    Attends meetings of clubs or organizations: Not on file    Relationship status: Not on file  . Intimate partner violence:    Fear of current or ex partner: Not on file    Emotionally abused: Not on file    Physically abused: Not on file    Forced sexual activity: Not on file  Other Topics Concern  . Not on file  Social History Narrative  . Not on file    FAMILY HISTORY:  Family History  Problem Relation Age of Onset  . Diabetes Mother   . Stroke Mother   . Hypertension Father     CURRENT MEDICATIONS:  Outpatient Encounter Medications as of 01/13/2018  Medication Sig  Note  . acyclovir (ZOVIRAX) 400 MG tablet Take 1 tablet (400 mg total) by mouth 2 (two) times daily.   Marland Kitchen ALPRAZolam (XANAX) 1 MG tablet TAKE 2 TABLETS BY MOUTH AT BEDTIME AS NEEDED FOR ANXIETY OR SLEEP.   Marland Kitchen aspirin EC 325 MG tablet Take 1 tablet (325 mg total) by mouth daily.   Marland Kitchen CALCIUM-VITAMIN D PO Take 1 tablet by mouth 2 (two) times daily.    Marland Kitchen  dexamethasone (DECADRON) 4 MG tablet Take 2.5 tablets (10 mg total) by mouth once a week. Take 10 mg once weekly 09/18/2017: Started on 09/18/17  . furosemide (LASIX) 20 MG tablet TAKE 1 TABLET BY MOUTH DAILY AS NEEDED FOR EDEMA.   Marland Kitchen gabapentin (NEURONTIN) 400 MG capsule Take 1 capsule (400 mg total) by mouth 2 (two) times daily.   Marland Kitchen lidocaine-prilocaine (EMLA) cream Apply small amount over port one (1) hour prior to appointment.   Marland Kitchen MAGNESIUM PO Take 1 tablet by mouth at bedtime.   Marland Kitchen omeprazole (PRILOSEC) 20 MG capsule Take 1 capsule (20 mg total) by mouth daily.   . Oxycodone HCl 10 MG TABS Take 1 tablet (10 mg total) by mouth every 8 (eight) hours as needed.   . pomalidomide (POMALYST) 2 MG capsule Take 1 capsule (2 mg total) by mouth daily. 3 weeks on and 1 week off   . predniSONE (DELTASONE) 20 MG tablet Take 20 mg by mouth daily with breakfast. Take 60 mg by mouth for 5 days. 09/24/2017: Taking 60 mg by mouth for 5 days  . tamsulosin (FLOMAX) 0.4 MG CAPS capsule Take 1 capsule (0.4 mg total) by mouth at bedtime.   . temazepam (RESTORIL) 15 MG capsule TAKE 1 CAPSULE BY MOUTH AT BEDTIME AS NEEDED FOR SLEEP   . traZODone (DESYREL) 100 MG tablet Take 1 tablet (100 mg total) by mouth at bedtime.   Marland Kitchen zolendronic acid (ZOMETA) 4 MG/5ML injection Inject 4 mg into the vein every 30 (thirty) days.    . [DISCONTINUED] HYDROcodone-acetaminophen (NORCO/VICODIN) 5-325 MG tablet TAKE 1 TABLET BY MOUTH EVERY 6 HOURS AS NEEDED FOR MODERATE PAIN   . [DISCONTINUED] pomalidomide (POMALYST) 2 MG capsule Take 1 capsule (2 mg total) by mouth every other day.    No  facility-administered encounter medications on file as of 01/13/2018.     ALLERGIES:  Allergies  Allergen Reactions  . Codeine Other (See Comments)    Headache  . Morphine And Related Other (See Comments)    Makes him feel weird  . Revlimid [Lenalidomide] Other (See Comments)    "Causes me to become weak"     PHYSICAL EXAM:  ECOG Performance status: 1  VITAL SIGNA:BP:137/77, P:87, R:18, T:97.6, O2:97% WEIGHT:244.2 lbs  Physical Exam  Constitutional: He is oriented to person, place, and time. He appears well-developed and well-nourished.  Cardiovascular: Normal rate, regular rhythm and normal heart sounds.  Pulmonary/Chest: Effort normal and breath sounds normal.  Musculoskeletal: Normal range of motion.  Neurological: He is alert and oriented to person, place, and time.  Skin: Skin is warm and dry.  Psychiatric: He has a normal mood and affect. His behavior is normal. Judgment and thought content normal.     LABORATORY DATA:  I have reviewed the labs as listed.  CBC    Component Value Date/Time   WBC 3.2 (L) 01/13/2018 0840   RBC 4.62 01/13/2018 0840   HGB 13.9 01/13/2018 0840   HCT 43.1 01/13/2018 0840   PLT 110 (L) 01/13/2018 0840   MCV 93.3 01/13/2018 0840   MCH 30.1 01/13/2018 0840   MCHC 32.3 01/13/2018 0840   RDW 13.6 01/13/2018 0840   LYMPHSABS 1.1 01/13/2018 0840   MONOABS 0.6 01/13/2018 0840   EOSABS 0.1 01/13/2018 0840   BASOSABS 0.0 01/13/2018 0840   CMP Latest Ref Rng & Units 01/13/2018 12/30/2017 12/16/2017  Glucose 70 - 99 mg/dL 110(H) 109(H) 108(H)  BUN 8 - 23 mg/dL '13 15 17  '$ Creatinine 0.61 -  1.24 mg/dL 0.79 0.74 0.69  Sodium 135 - 145 mmol/L 140 139 138  Potassium 3.5 - 5.1 mmol/L 4.3 4.4 4.2  Chloride 98 - 111 mmol/L 105 108 108  CO2 22 - 32 mmol/L '28 24 25  '$ Calcium 8.9 - 10.3 mg/dL 9.3 8.6(L) 8.5(L)  Total Protein 6.5 - 8.1 g/dL 6.1(L) 6.3(L) 5.9(L)  Total Bilirubin 0.3 - 1.2 mg/dL 0.6 1.0 0.4  Alkaline Phos 38 - 126 U/L 21(L) 22(L)  24(L)  AST 15 - 41 U/L '17 16 15  '$ ALT 0 - 44 U/L '15 13 14       '$ DIAGNOSTIC IMAGING:  I have independently reviewed the scans and discussed with the patient. .   I have reviewed Francene Finders, NP's note and agree with the documentation.  I personally performed a face-to-face visit, made revisions and my assessment and plan is as follows.    ASSESSMENT & PLAN:   Multiple myeloma without remission (HCC) 1.  IgG kappa multiple myeloma: - Initially treated with RVD from 04/07/2014 through 07/28/2014, placed on maintenance Revlimid, which was discontinued 1 and half year ago, unknown reasons, patient thinks it was discontinued when he was receiving radiation. -MRI in January of the brain showed new right temporal lesion, status post radiation therapy, PET/CT scan in January 2019 showed improvement of multiple myeloma with no new lesions, started on weekly Velcade and dexamethasone on 02/18/2017, status post 2 cycles, Velcade changed to every other week on 05/06/2017 secondary to neuropathy, takes dexamethasone 20 mg on days of Velcade. - We reviewed the results of the M spike which was stable at 0.3.  Free light chain ratio went up to 4. -Because of persistent back pain, I have done an MRI of the thoracic and lumbar spine.  We discussed the results of the scans today.  He had a new rib lesion close to the spine on the left side, possibly eighth rib.  There is a soft tissue mass in that area.  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  There is also chronic L4 compression fracture which is stable. - Velcade was discontinued secondary to progression.  I have recommended a much stronger regimen containing daratumumab, pomalidomide and dexamethasone.  Daratumumab will be given weekly for the first 8 weeks.  Pomalidomide will be started at a reduced dose of 2 mg, 21 days on, 7 days off.  Dexamethasone will be taken at a flat dose of 10 mg on a weekly basis.  We talked about the side  effects of this regimen in detail. -DPd started on 09/17/2017.  He developed erythematous maculopapular rash involving the trunk after taking 3 pills of pomalidomide.  It improved after steroids.  (Pomalidomide dose is 2 mg 3 weeks on 1 week off, dexamethasone doses 10 mg weekly). -Status post XRT to the left eighth rib lesion, 30 Gy in 10 fractions from 09/29/2017 through 10/14/2017. - Daratumumab restarted after radiation on 10/28/2017. - M spike on 11/25/2017 was 0.3 g.  Myeloma panel from today's pending. - Darzalex was switched to every 2 weeks on 12/16/2017. -Pomalidomide is being taken 2 mg 3 weeks on 1 week off.  I have renewed his prescription. - He may proceed with Darzalex today.  I have reviewed his blood work. -I will reevaluate him in 4 weeks.  2.  Peripheral neuropathy: He will continue Neurontin 3 times a day for his numbness in the feet and legs.   3.  Bone protection: -He will continue Zometa every 8 weeks.  He will continue calcium and vitamin D.  4.  Back pain: -This is from combination of myeloma and degenerative disc disease. - He is taking oxycodone 10 mg 2 to 3 tablets/day.  He is also taking Motrin 800 mg 3 times a day. -We discussed the results of the MRI of the lumbar spine which showed diffuse lumbar spine spondylosis.  This was done on 01/11/2018.  At L3-L4 there is a broad-based disc bulge with a left paracentral disc extrusion with inferior migration of disc material.  Severe bilateral facet arthropathy.  Severe spinal stenosis.  Severe bilateral foraminal stenosis.  15 mm bone lesion in the left sacrum consistent with multiple myeloma is stable.  I compared it with his previous scan in August.  5.  Sleep: - He has used Restoril and trazodone without help.  He thinks that he already used Ambien also without help. -He reports that he takes half a tablet of Xanax with gabapentin and it helps him to sleep.  He will continue that.      Orders placed this encounter:    Orders Placed This Encounter  Procedures  . Lactate dehydrogenase  . CBC with Differential/Platelet  . Comprehensive metabolic panel      Derek Jack, MD [place] [phone]

## 2018-01-13 NOTE — Assessment & Plan Note (Signed)
1.  IgG kappa multiple myeloma: - Initially treated with RVD from 04/07/2014 through 07/28/2014, placed on maintenance Revlimid, which was discontinued 1 and half year ago, unknown reasons, patient thinks it was discontinued when he was receiving radiation. -MRI in January of the brain showed new right temporal lesion, status post radiation therapy, PET/CT scan in January 2019 showed improvement of multiple myeloma with no new lesions, started on weekly Velcade and dexamethasone on 02/18/2017, status post 2 cycles, Velcade changed to every other week on 05/06/2017 secondary to neuropathy, takes dexamethasone 20 mg on days of Velcade. - We reviewed the results of the M spike which was stable at 0.3.  Free light chain ratio went up to 4. -Because of persistent back pain, I have done an MRI of the thoracic and lumbar spine.  We discussed the results of the scans today.  He had a new rib lesion close to the spine on the left side, possibly eighth rib.  There is a soft tissue mass in that area.  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  There is also chronic L4 compression fracture which is stable. - Velcade was discontinued secondary to progression.  I have recommended a much stronger regimen containing daratumumab, pomalidomide and dexamethasone.  Daratumumab will be given weekly for the first 8 weeks.  Pomalidomide will be started at a reduced dose of 2 mg, 21 days on, 7 days off.  Dexamethasone will be taken at a flat dose of 10 mg on a weekly basis.  We talked about the side effects of this regimen in detail. -DPd started on 09/17/2017.  He developed erythematous maculopapular rash involving the trunk after taking 3 pills of pomalidomide.  It improved after steroids.  (Pomalidomide dose is 2 mg 3 weeks on 1 week off, dexamethasone doses 10 mg weekly). -Status post XRT to the left eighth rib lesion, 30 Gy in 10 fractions from 09/29/2017 through 10/14/2017. - Daratumumab restarted after  radiation on 10/28/2017. - M spike on 11/25/2017 was 0.3 g.  Myeloma panel from today's pending. - Darzalex was switched to every 2 weeks on 12/16/2017. -Pomalidomide is being taken 2 mg 3 weeks on 1 week off.  I have renewed his prescription. - He may proceed with Darzalex today.  I have reviewed his blood work. -I will reevaluate him in 4 weeks.  2.  Peripheral neuropathy: He will continue Neurontin 3 times a day for his numbness in the feet and legs.   3.  Bone protection: -He will continue Zometa every 8 weeks.  He will continue calcium and vitamin D.  4.  Back pain: -This is from combination of myeloma and degenerative disc disease. - He is taking oxycodone 10 mg 2 to 3 tablets/day.  He is also taking Motrin 800 mg 3 times a day. -We discussed the results of the MRI of the lumbar spine which showed diffuse lumbar spine spondylosis.  This was done on 01/11/2018.  At L3-L4 there is a broad-based disc bulge with a left paracentral disc extrusion with inferior migration of disc material.  Severe bilateral facet arthropathy.  Severe spinal stenosis.  Severe bilateral foraminal stenosis.  15 mm bone lesion in the left sacrum consistent with multiple myeloma is stable.  I compared it with his previous scan in August.  5.  Sleep: - He has used Restoril and trazodone without help.  He thinks that he already used Ambien also without help. -He reports that he takes half a tablet of Xanax with  gabapentin and it helps him to sleep.  He will continue that.

## 2018-01-13 NOTE — Progress Notes (Signed)
Labs reviewed with Dr. Delton Coombes. Proceed with treatment per MD.   Treatment given today per MD orders. Tolerated infusion without adverse affects. Vital signs stable. No complaints at this time. Discharged from clinic ambulatory. F/U with [facility] as scheduled.

## 2018-01-13 NOTE — Patient Instructions (Signed)
[facility] at [facility]  Discharge Instructions:   _______________________________________________________________  Thank you for choosing [facility] at [facility] to provide your oncology and hematology care.  To afford each patient quality time with our providers, please arrive at least 15 minutes before your scheduled appointment.  You need to re-schedule your appointment if you arrive 10 or more minutes late.  We strive to give you quality time with our providers, and arriving late affects you and other patients whose appointments are after yours.  Also, if you no show three or more times for appointments you may be dismissed from the clinic.  Again, thank you for choosing [facility] at [facility]. Our hope is that these requests will allow you access to exceptional care and in a timely manner. _______________________________________________________________  If you have questions after your visit, please contact our office at [phone] between the hours of 8:30 a.m. and 5:00 p.m. Voicemails left after 4:30 p.m. will not be returned until the following business day. _______________________________________________________________  For prescription refill requests, have your pharmacy contact our office. _______________________________________________________________  Recommendations made by the consultant and any test results will be sent to your referring physician. _______________________________________________________________ 

## 2018-01-13 NOTE — Patient Instructions (Signed)
[facility] Discharge Instructions for Patients Receiving Chemotherapy  Today you received the following chemotherapy agents   To help prevent nausea and vomiting after your treatment, we encourage you to take your nausea medication   If you develop nausea and vomiting that is not controlled by your nausea medication, call the clinic.   BELOW ARE SYMPTOMS THAT SHOULD BE REPORTED IMMEDIATELY:  *FEVER GREATER THAN 100.5 F  *CHILLS WITH OR WITHOUT FEVER  NAUSEA AND VOMITING THAT IS NOT CONTROLLED WITH YOUR NAUSEA MEDICATION  *UNUSUAL SHORTNESS OF BREATH  *UNUSUAL BRUISING OR BLEEDING  TENDERNESS IN MOUTH AND THROAT WITH OR WITHOUT PRESENCE OF ULCERS  *URINARY PROBLEMS  *BOWEL PROBLEMS  UNUSUAL RASH Items with * indicate a potential emergency and should be followed up as soon as possible.  Feel free to call the clinic should you have any questions or concerns. The clinic phone number is [phone].  Please show the CHEMO ALERT CARD at check-in to the Emergency Department and triage nurse.   

## 2018-01-14 LAB — PROTEIN ELECTROPHORESIS, SERUM
A/G Ratio: 1.6 (ref 0.7–1.7)
Albumin ELP: 3.3 g/dL (ref 2.9–4.4)
Alpha-1-Globulin: 0.2 g/dL (ref 0.0–0.4)
Alpha-2-Globulin: 0.8 g/dL (ref 0.4–1.0)
Beta Globulin: 0.8 g/dL (ref 0.7–1.3)
Gamma Globulin: 0.3 g/dL — ABNORMAL LOW (ref 0.4–1.8)
Globulin, Total: 2.1 g/dL — ABNORMAL LOW (ref 2.2–3.9)
M-Spike, %: 0.2 g/dL — ABNORMAL HIGH
Total Protein ELP: 5.4 g/dL — ABNORMAL LOW (ref 6.0–8.5)

## 2018-01-14 LAB — KAPPA/LAMBDA LIGHT CHAINS
Kappa free light chain: 14.7 mg/L (ref 3.3–19.4)
Kappa, lambda light chain ratio: 1.71 — ABNORMAL HIGH (ref 0.26–1.65)
Lambda free light chains: 8.6 mg/L (ref 5.7–26.3)

## 2018-01-21 ENCOUNTER — Telehealth (HOSPITAL_COMMUNITY): Payer: Self-pay | Admitting: Hematology

## 2018-01-21 ENCOUNTER — Encounter (HOSPITAL_COMMUNITY): Payer: Self-pay | Admitting: *Deleted

## 2018-01-21 NOTE — Telephone Encounter (Signed)
I will call patient and assess and then will talk with Dr. Delton Coombes.

## 2018-01-21 NOTE — Progress Notes (Signed)
Patient called clinic stating he has a rash on bilateral lower legs from the knees down, bilateral arms and on his back. He states that it started last night. He has been taking benadryl for it.    I spoke with Dr. Delton Coombes and he wants to see the patient tomorrow.  Patient is to hold Pomalyst until he comes in for evaluation.   I have scheduled patient for tomorrow morning at 0845 and he is aware. He verbalizes understanding of holding Pomalyst at this time.

## 2018-01-21 NOTE — Telephone Encounter (Signed)
Pt called and left a vm stating he has a rash on both his back and legs. He thinks its "the pills" (pomalyst) he is taking.  He stated he will not take the pills today.

## 2018-01-22 ENCOUNTER — Ambulatory Visit (HOSPITAL_COMMUNITY): Payer: Medicare Other | Admitting: Hematology

## 2018-01-22 ENCOUNTER — Inpatient Hospital Stay (HOSPITAL_BASED_OUTPATIENT_CLINIC_OR_DEPARTMENT_OTHER): Payer: Medicare Other | Admitting: Hematology

## 2018-01-22 ENCOUNTER — Other Ambulatory Visit: Payer: Self-pay

## 2018-01-22 ENCOUNTER — Encounter (HOSPITAL_COMMUNITY): Payer: Self-pay | Admitting: Hematology

## 2018-01-22 DIAGNOSIS — Z5112 Encounter for antineoplastic immunotherapy: Secondary | ICD-10-CM | POA: Diagnosis not present

## 2018-01-22 DIAGNOSIS — C9000 Multiple myeloma not having achieved remission: Secondary | ICD-10-CM

## 2018-01-22 NOTE — Patient Instructions (Signed)
[facility] Discharge Instructions     Thank you for choosing [facility] at [facility] to provide your oncology and hematology care.  To afford each patient quality time with our provider, please arrive at least 15 minutes before your scheduled appointment time.   If you have a lab appointment with the Cancer Center please come in thru the  Main Entrance and check in at the main information desk  You need to re-schedule your appointment should you arrive 10 or more minutes late.  We strive to give you quality time with our providers, and arriving late affects you and other patients whose appointments are after yours.  Also, if you no show three or more times for appointments you may be dismissed from the clinic at the providers discretion.     Again, thank you for choosing [facility].  Our hope is that these requests will decrease the amount of time that you wait before being seen by our physicians.       _____________________________________________________________  Should you have questions after your visit to [facility], please contact our office at [phone] between the hours of 8:00 a.m. and 4:30 p.m.  Voicemails left after 4:00 p.m. will not be returned until the following business day.  For prescription refill requests, have your pharmacy contact our office and allow 72 hours.    Cancer Center Support Programs:   > Cancer Support Group  2nd Tuesday of the month 1pm-2pm, Journey Room    

## 2018-01-22 NOTE — Progress Notes (Signed)
[place] [place], [place] [address]   CLINIC:  Medical Oncology/Hematology  PCP:  [name], MD [address] [place] [address] [phone]   REASON FOR VISIT: Follow-up for multiple myeloma  CURRENT THERAPY: pomalidomide every other day, daratumumab (DARZALEX), and dexamethasoneevery 2 weeks  BRIEF ONCOLOGIC HISTORY:    Multiple myeloma without remission ([place])   11/16/2012 Initial Diagnosis    L occipital condyle destructive lesion, not biopsied secondary to location. XRT    12/01/2012 Imaging    L3 superior endplate compression FX, no lytic lesions to suggest myeloma    02/06/2014 Imaging    soft tissue mass along the right posterior fifth rib with some rib destruction    02/16/2014 Miscellaneous    Normal CBC, Normal CMP, kappa,lamda ratio of 2.62 (abnormal), UIEP with slightly restricted band, IgG kappa, SPEP/IEP with monoclonal protein at 0.79 g/dl, normal igG, suppressed IGM    02/20/2014 Bone Marrow Biopsy    33% kappa restricted plasma cells Normal FISH, normal cytogenetics    02/21/2014 - 03/08/2014 Radiation Therapy    30Gy to R rib lesion, lesion not biopsied    03/16/2014 PET scan    Scattered hypermetabolic osseous lesions, including the calvarium, ribs, left scapula, sacrum, and left femoral shaft. An expansile left vertebral body lesion at L3 extends into the epidural space of the left lateral recess,     03/17/2014 - 04/07/2014 Chemotherapy    Velcade and Dexamethasone due to initial denial of Revlimid by insurance company.  Zometa monthly.    03/22/2014 Imaging    MR_ L-Spine- Enhancing lesions compatible with multiple myeloma at L2, L3, and S1-2.    04/07/2014 - 07/28/2014 Chemotherapy    RVD with Revlimid at 25 mg days 1-14.  Revlimid dose reduced to 25 mg every other day x 14 days with 7 day respite beginning on day 1 of cycle 2.    04/17/2014 Adverse Reaction    Revlimid-induced rash.  Treated with  steroids.    07/28/2014 - 08/04/2014 Chemotherapy    Revlimid daily    08/04/2014 Adverse Reaction    Velcade-induced peripheral neuropathy    08/04/2014 Treatment Plan Change    D/C Velcade    08/11/2014 PET scan    Response to therapy. Improvement and resolution of foci of osseous hypermetabolism.    08/22/2014 - 08/28/2014 Chemotherapy    Revlimid 25 mg days 1-21 every 28 days    08/28/2014 Adverse Reaction    Revlimid-induced rash. Medication held.  Medrol dose Pak prescribed.    09/04/2014 - 01/08/2015 Chemotherapy    Revlimid 10 mg every other day, without dexamethasone    01/08/2015 - 01/15/2015 Hospital Admission    Colonic diverticular abscess with IR drain placement    03/02/2015 PET scan    Only bony uptake is low activity at site of deformity/callus at R second rib FX, high activity in sigmoid colon, advanced sigmoid divertidulosis. Abscess not resolved?    06/04/2015 Imaging    CT nonobstructive L renal calculus, diverticulosis of descending and sigmoid colon without inflammation, moderate prostatic enlargement    07/12/2015 Surgery    Diverticulitis s/p robotic sigmoid colectomy with Dr. Johney Maine    08/23/2015 PET scan    Primarily similar hypermetabolic osseous foci within the R sided ribs. new L third rib focus of hypermetab and sclerosis is favored to be related to healing FX. no soft tissue myeloma id'd. Presumed postop hypermetab and edema about the R  pelvic wall     03/06/2016 PET scan    1. Reduced activity in the prior lesion such as the right second and fifth rib lesions, activity nearly completely resolved and significantly less than added mediastinal blood pool activity. No new lesions are identified. 2. Other imaging findings of potential clinical significance: Coronary, aortic arch, and branch vessel atherosclerotic vascular disease. Aortoiliac atherosclerotic vascular disease. Enlarged prostate gland. Colonic diverticula.    02/17/2017 PET scan    HEAD/NECK: No  hypermetabolic activity in the scalp. No hypermetabolic cervical lymph nodes. CHEST: No hypermetabolic mediastinal or hilar nodes. Right upper lobe scarring/atelectasis. No suspicious pulmonary nodules on the CT scan. ABDOMEN/PELVIS: No abnormal hypermetabolic activity within the liver, pancreas, adrenal glands, or spleen. No hypermetabolic lymph nodes in the abdomen or pelvis. Atherosclerotic calcifications of the abdominal aorta and branch vessels. Left renal sinus cysts. Sigmoid diverticulosis, without evidence of diverticulitis. Prostatomegaly. SKELETON: Vague hypermetabolism involving the inferior sternum, max SUV 3.2, previously 8.2. Focal hypermetabolism involving the left sacrum, max SUV 4.0, previously 6.7. EXTREMITIES: No abnormal hypermetabolic activity in the lower extremities.    02/18/2017 -  Chemotherapy    Bortezomib 1.56m/m2 QWk + Dexamethasone 156mQTue/Fri --Cycle #1, 02/18/17     02/18/2017 - 08/26/2017 Chemotherapy    The patient had bortezomib SQ (VELCADE) chemo injection 3 mg, 1.3 mg/m2 = 3 mg, Subcutaneous,  Once, 7 of 9 cycles Administration: 3 mg (02/18/2017), 3 mg (02/26/2017), 3 mg (03/04/2017), 3 mg (03/11/2017), 3 mg (04/08/2017), 3 mg (03/18/2017), 3 mg (03/25/2017), 3 mg (04/01/2017), 3 mg (05/06/2017), 3 mg (05/20/2017), 3 mg (06/03/2017), 3 mg (06/17/2017), 3 mg (07/01/2017), 3 mg (07/15/2017), 3 mg (07/29/2017), 3 mg (08/12/2017), 3 mg (08/26/2017)  for chemotherapy treatment.     09/14/2017 -  Chemotherapy    The patient had daratumumab (DARZALEX) 1,800 mg in sodium chloride 0.9 % 910 mL (1.8 mg/mL) chemo infusion, 15.9 mg/kg = 1,820 mg, Intravenous, Once, 1 of 1 cycle Administration: 1,800 mg (09/17/2017) daratumumab (DARZALEX) 1,800 mg in sodium chloride 0.9 % 410 mL (3.6 mg/mL) chemo infusion, 1,820 mg, Intravenous, Once, 4 of 7 cycles Administration: 1,800 mg (10/28/2017), 1,800 mg (11/04/2017), 1,800 mg (11/11/2017), 1,800 mg (11/18/2017), 1,800 mg (11/25/2017), 1,800 mg (12/02/2017),  1,800 mg (12/09/2017), 1,800 mg (12/16/2017), 1,800 mg (12/30/2017), 1,800 mg (01/13/2018)  for chemotherapy treatment.       INTERVAL HISTORY:  Dean [name].o. male returns for routine follow-up for multiple myeloma. He is here today for a rash after he started his pomalidomide back. IT was an itchy rash on his trunk, forearms, face and legs. He stopped the medications and today he a very faint rash on his legs. He denies any problems breathing. Denies any SOB, cough, or hemoptysis. Denies any new pain.    REVIEW OF SYSTEMS:  Review of Systems  Skin: Positive for itching and rash.  All other systems reviewed and are negative.    PAST MEDICAL/SURGICAL HISTORY:  Past Medical History:  Diagnosis Date  . BPH (benign prostatic hyperplasia)   . Colonic diverticular abscess 01/08/2015  . Diverticulitis 1132/44/01 complicated by abscess and required percutaneous drainage  . GERD (gastroesophageal reflux disease)   . H/O ETOH abuse   . History of chemotherapy last done jan 2017  . History of radiation therapy 01/05/13-02/10/13   45 gray to left occipital condyle region  . History of radiation therapy 12/01/16-12/10/16   Parasternal nodule, chest- 24 Gy total delivered in 8 fractions, Left sacro-iliac, pelvis- 24 Gy  total delivered in 8 fractions   . History of radiation therapy 02/19/17-02/22/17   right temporal scalp 30 Gy in 10 fractions  . Intra-abdominal abscess ([place])   . Multiple myeloma ([place]) 2015  . Neuropathy   . Radiation 02/21/14-03/08/14   right posterior chest wall area 30 gray  . Radiation 04/19/14-05/02/14   lumbar spine 25 gray  . Skull lesion    Left occipital condyle  . Wrist fracture, left    x 2   Past Surgical History:  Procedure Laterality Date  . COLONOSCOPY N/A 04/19/2015   Procedure: COLONOSCOPY;  Surgeon: Danie Binder, MD;  Location: [facility];  Service: Endoscopy;  Laterality: N/A;  1:30 PM  . CYST EXCISION  1959   tail bone  . IR IMAGING GUIDED PORT  INSERTION  09/25/2017     SOCIAL HISTORY:  Social History   Socioeconomic History  . Marital status: Married    Spouse name: Not on file  . Number of children: 3  . Years of education: Not on file  . Highest education level: Not on file  Occupational History    Employer: RETIRED  Social Needs  . Financial resource strain: Not on file  . Food insecurity:    Worry: Not on file    Inability: Not on file  . Transportation needs:    Medical: Not on file    Non-medical: Not on file  Tobacco Use  . Smoking status: Never Smoker  . Smokeless tobacco: Never Used  Substance and Sexual Activity  . Alcohol use: No    Comment: " Not much no more"  . Drug use: No  . Sexual activity: Not on file  Lifestyle  . Physical activity:    Days per week: Not on file    Minutes per session: Not on file  . Stress: Not on file  Relationships  . Social connections:    Talks on phone: Not on file    Gets together: Not on file    Attends religious service: Not on file    Active member of club or organization: Not on file    Attends meetings of clubs or organizations: Not on file    Relationship status: Not on file  . Intimate partner violence:    Fear of current or ex partner: Not on file    Emotionally abused: Not on file    Physically abused: Not on file    Forced sexual activity: Not on file  Other Topics Concern  . Not on file  Social History Narrative  . Not on file    FAMILY HISTORY:  Family History  Problem Relation Age of Onset  . Diabetes Mother   . Stroke Mother   . Hypertension Father     CURRENT MEDICATIONS:  Outpatient Encounter Medications as of 01/22/2018  Medication Sig Note  . acyclovir (ZOVIRAX) 400 MG tablet Take 1 tablet (400 mg total) by mouth 2 (two) times daily.   Marland Kitchen ALPRAZolam (XANAX) 1 MG tablet TAKE 2 TABLETS BY MOUTH AT BEDTIME AS NEEDED FOR ANXIETY OR SLEEP.   Marland Kitchen aspirin EC 325 MG tablet Take 1 tablet (325 mg total) by mouth daily.   Marland Kitchen CALCIUM-VITAMIN D  PO Take 1 tablet by mouth 2 (two) times daily.    Marland Kitchen dexamethasone (DECADRON) 4 MG tablet Take 2.5 tablets (10 mg total) by mouth once a week. Take 10 mg once weekly 09/18/2017: Started on 09/18/17  . furosemide (LASIX) 20 MG tablet TAKE 1 TABLET  BY MOUTH DAILY AS NEEDED FOR EDEMA.   Marland Kitchen gabapentin (NEURONTIN) 400 MG capsule Take 1 capsule (400 mg total) by mouth 2 (two) times daily.   Marland Kitchen lidocaine-prilocaine (EMLA) cream Apply small amount over port one (1) hour prior to appointment.   Marland Kitchen omeprazole (PRILOSEC) 20 MG capsule Take 1 capsule (20 mg total) by mouth daily.   . Oxycodone HCl 10 MG TABS Take 1 tablet (10 mg total) by mouth every 8 (eight) hours as needed.   . tamsulosin (FLOMAX) 0.4 MG CAPS capsule Take 1 capsule (0.4 mg total) by mouth at bedtime.   Marland Kitchen zolendronic acid (ZOMETA) 4 MG/5ML injection Inject 4 mg into the vein every 30 (thirty) days.    Marland Kitchen MAGNESIUM PO Take 1 tablet by mouth at bedtime.   . pomalidomide (POMALYST) 2 MG capsule Take 1 capsule (2 mg total) by mouth daily. 3 weeks on and 1 week off (Patient not taking: Reported on 01/22/2018)   . temazepam (RESTORIL) 15 MG capsule TAKE 1 CAPSULE BY MOUTH AT BEDTIME AS NEEDED FOR SLEEP (Patient not taking: Reported on 01/22/2018)   . traZODone (DESYREL) 100 MG tablet Take 1 tablet (100 mg total) by mouth at bedtime. (Patient not taking: Reported on 01/22/2018)   . [DISCONTINUED] predniSONE (DELTASONE) 20 MG tablet Take 20 mg by mouth daily with breakfast. Take 60 mg by mouth for 5 days. 09/24/2017: Taking 60 mg by mouth for 5 days   No facility-administered encounter medications on file as of 01/22/2018.     ALLERGIES:  Allergies  Allergen Reactions  . Codeine Other (See Comments)    Headache  . Morphine And Related Other (See Comments)    Makes him feel weird  . Revlimid [Lenalidomide] Other (See Comments)    "Causes me to become weak"     PHYSICAL EXAM:  ECOG Performance status: 1  Vitals:   01/22/18 1424  BP: 125/68    Pulse: 79  Resp: 20  Temp: 98 F (36.7 C)  SpO2: 96%   There were no vitals filed for this visit.  Physical Exam Constitutional:      Appearance: Normal appearance. He is normal weight.  Musculoskeletal: Normal range of motion.  Skin:    General: Skin is warm and dry.  Neurological:     Mental Status: He is alert and oriented to person, place, and time. Mental status is at baseline.  Psychiatric:        Mood and Affect: Mood normal.        Behavior: Behavior normal.        Thought Content: Thought content normal.        Judgment: Judgment normal.    Mild faded erythematous rash on the upper extremities.  No rash elsewhere.  LABORATORY DATA:  I have reviewed the labs as listed.  CBC    Component Value Date/Time   WBC 3.2 (L) 01/13/2018 0840   RBC 4.62 01/13/2018 0840   HGB 13.9 01/13/2018 0840   HCT 43.1 01/13/2018 0840   PLT 110 (L) 01/13/2018 0840   MCV 93.3 01/13/2018 0840   MCH 30.1 01/13/2018 0840   MCHC 32.3 01/13/2018 0840   RDW 13.6 01/13/2018 0840   LYMPHSABS 1.1 01/13/2018 0840   MONOABS 0.6 01/13/2018 0840   EOSABS 0.1 01/13/2018 0840   BASOSABS 0.0 01/13/2018 0840   CMP Latest Ref Rng & Units 01/13/2018 12/30/2017 12/16/2017  Glucose 70 - 99 mg/dL 110(H) 109(H) 108(H)  BUN 8 - 23 mg/dL 13 15 17  Creatinine 0.61 - 1.24 mg/dL 0.79 0.74 0.69  Sodium 135 - 145 mmol/L 140 139 138  Potassium 3.5 - 5.1 mmol/L 4.3 4.4 4.2  Chloride 98 - 111 mmol/L 105 108 108  CO2 22 - 32 mmol/L 28 24 25   Calcium 8.9 - 10.3 mg/dL 9.3 8.6(L) 8.5(L)  Total Protein 6.5 - 8.1 g/dL 6.1(L) 6.3(L) 5.9(L)  Total Bilirubin 0.3 - 1.2 mg/dL 0.6 1.0 0.4  Alkaline Phos 38 - 126 U/L 21(L) 22(L) 24(L)  AST 15 - 41 U/L 17 16 15   ALT 0 - 44 U/L 15 13 14        DIAGNOSTIC IMAGING:  I have independently reviewed the scans and discussed with the patient.   I have reviewed Francene Finders, NP's note and agree with the documentation.  I personally performed a face-to-face visit, made  revisions and my assessment and plan is as follows.    ASSESSMENT & PLAN:   Multiple myeloma without remission ([facility]) 1.  IgG kappa multiple myeloma: - Last Darzalex infusion was on 03/16/2017. - He started pomalidomide 2 mg, 3 weeks on 1 week off on 01/14/2018. -He noticed erythematous maculopapular itching rash on the trunk, forearms and legs on 01/20/2018. -He called our office and stop taking pomalidomide. -The rash faded over the next 1 to 2 days. - Today's physical examination showed very faint rash on the upper extremities.  No rash on the trunk seen. -He had similar rash when he was initially started on pomalidomide.  He did not get any rash once he was switched to every other day. -I have recommended him to start back on pomalidomide 2 mg every other day until 02/04/2018. -He will keep his follow-up appointment.        Orders placed this encounter:  No orders of the defined types were placed in this encounter.     Derek Jack, MD [place] [phone]

## 2018-01-22 NOTE — Assessment & Plan Note (Addendum)
1.  IgG kappa multiple myeloma: - Last Darzalex infusion was on 03/16/2017. - Dean [name] started pomalidomide 2 mg, 3 weeks on 1 week off on 01/14/2018. -Dean [name] noticed erythematous maculopapular itching rash on the trunk, forearms and legs on 01/20/2018. -Dean [name] called our office and stop taking pomalidomide. -The rash faded over the next 1 to 2 days. - Today's physical examination showed very faint rash on the upper extremities.  No rash on the trunk seen. -Dean [name] had similar rash when Dean [name] was initially started on pomalidomide.  Dean [name] did not get any rash once Dean [name] was switched to every other day. -I have recommended him to start back on pomalidomide 2 mg every other day until 02/04/2018. -Dean [name] will keep his follow-up appointment.

## 2018-02-09 ENCOUNTER — Other Ambulatory Visit (HOSPITAL_COMMUNITY): Payer: Self-pay | Admitting: *Deleted

## 2018-02-10 ENCOUNTER — Other Ambulatory Visit (HOSPITAL_COMMUNITY): Payer: Self-pay | Admitting: *Deleted

## 2018-02-10 ENCOUNTER — Ambulatory Visit (HOSPITAL_COMMUNITY): Payer: Medicare Other

## 2018-02-10 ENCOUNTER — Ambulatory Visit (HOSPITAL_COMMUNITY): Payer: Medicare Other | Admitting: Hematology

## 2018-02-10 ENCOUNTER — Other Ambulatory Visit (HOSPITAL_COMMUNITY): Payer: Medicare Other

## 2018-02-10 DIAGNOSIS — C9000 Multiple myeloma not having achieved remission: Secondary | ICD-10-CM

## 2018-02-10 MED ORDER — POMALIDOMIDE 2 MG PO CAPS: 2.0000 mg | ORAL_CAPSULE | ORAL | 0 refills | Status: DC

## 2018-02-10 NOTE — Telephone Encounter (Signed)
Chart reviewed and I spoke with Dr. Delton Coombes and orders received for patient to continue pomalyst at 2 mg every other day.

## 2018-02-12 ENCOUNTER — Inpatient Hospital Stay (HOSPITAL_COMMUNITY): Payer: Medicare Other | Attending: Hematology

## 2018-02-12 ENCOUNTER — Encounter (HOSPITAL_COMMUNITY): Payer: Self-pay | Admitting: Hematology

## 2018-02-12 ENCOUNTER — Inpatient Hospital Stay (HOSPITAL_COMMUNITY): Payer: Medicare Other

## 2018-02-12 ENCOUNTER — Inpatient Hospital Stay (HOSPITAL_BASED_OUTPATIENT_CLINIC_OR_DEPARTMENT_OTHER): Payer: Medicare Other | Admitting: Hematology

## 2018-02-12 ENCOUNTER — Other Ambulatory Visit: Payer: Self-pay

## 2018-02-12 VITALS — BP 132/63 | HR 95 | Temp 97.3°F | Resp 16

## 2018-02-12 VITALS — BP 143/66 | HR 80 | Temp 97.5°F | Resp 18 | Wt 240.5 lb

## 2018-02-12 DIAGNOSIS — C9000 Multiple myeloma not having achieved remission: Secondary | ICD-10-CM | POA: Insufficient documentation

## 2018-02-12 DIAGNOSIS — G629 Polyneuropathy, unspecified: Secondary | ICD-10-CM

## 2018-02-12 DIAGNOSIS — Z923 Personal history of irradiation: Secondary | ICD-10-CM

## 2018-02-12 DIAGNOSIS — M549 Dorsalgia, unspecified: Secondary | ICD-10-CM

## 2018-02-12 DIAGNOSIS — Z5112 Encounter for antineoplastic immunotherapy: Secondary | ICD-10-CM | POA: Insufficient documentation

## 2018-02-12 LAB — COMPREHENSIVE METABOLIC PANEL
ALT: 13 U/L (ref 0–44)
AST: 15 U/L (ref 15–41)
Albumin: 3.3 g/dL — ABNORMAL LOW (ref 3.5–5.0)
Alkaline Phosphatase: 22 U/L — ABNORMAL LOW (ref 38–126)
Anion gap: 6 (ref 5–15)
BUN: 15 mg/dL (ref 8–23)
CO2: 25 mmol/L (ref 22–32)
Calcium: 8.7 mg/dL — ABNORMAL LOW (ref 8.9–10.3)
Chloride: 107 mmol/L (ref 98–111)
Creatinine, Ser: 0.71 mg/dL (ref 0.61–1.24)
GFR calc Af Amer: >60 mL/min (ref >60)
GFR calc non Af Amer: >60 mL/min (ref >60)
Glucose, Bld: 115 mg/dL — ABNORMAL HIGH (ref 70–99)
Potassium: 4.2 mmol/L (ref 3.5–5.1)
Sodium: 138 mmol/L (ref 135–145)
Total Bilirubin: 0.6 mg/dL (ref 0.3–1.2)
Total Protein: 5.8 g/dL — ABNORMAL LOW (ref 6.5–8.1)

## 2018-02-12 LAB — CBC WITH DIFFERENTIAL/PLATELET
Abs Immature Granulocytes: 0.01 10*3/uL (ref 0.00–0.07)
Basophils Absolute: 0.1 10*3/uL (ref 0.0–0.1)
Basophils Relative: 2 %
Eosinophils Absolute: 0.1 10*3/uL (ref 0.0–0.5)
Eosinophils Relative: 4 %
HCT: 42.8 % (ref 39.0–52.0)
Hemoglobin: 13.5 g/dL (ref 13.0–17.0)
Immature Granulocytes: 0 %
Lymphocytes Relative: 31 %
Lymphs Abs: 1 10*3/uL (ref 0.7–4.0)
MCH: 28.8 pg (ref 26.0–34.0)
MCHC: 31.5 g/dL (ref 30.0–36.0)
MCV: 91.3 fL (ref 80.0–100.0)
Monocytes Absolute: 0.7 10*3/uL (ref 0.1–1.0)
Monocytes Relative: 20 %
Neutro Abs: 1.4 10*3/uL — ABNORMAL LOW (ref 1.7–7.7)
Neutrophils Relative %: 43 %
Platelets: 148 10*3/uL — ABNORMAL LOW (ref 150–400)
RBC: 4.69 MIL/uL (ref 4.22–5.81)
RDW: 14.2 % (ref 11.5–15.5)
WBC: 3.2 10*3/uL — ABNORMAL LOW (ref 4.0–10.5)
nRBC: 0 % (ref 0.0–0.2)

## 2018-02-12 LAB — LACTATE DEHYDROGENASE: LDH: 129 U/L (ref 98–192)

## 2018-02-12 MED ORDER — PROCHLORPERAZINE MALEATE 10 MG PO TABS
10.0000 mg | ORAL_TABLET | Freq: Once | ORAL | Status: AC
  Administered 2018-02-12: 10 mg via ORAL
  Filled 2018-02-12: qty 1

## 2018-02-12 MED ORDER — HEPARIN SOD (PORK) LOCK FLUSH 100 UNIT/ML IV SOLN
500.0000 [IU] | Freq: Once | INTRAVENOUS | Status: AC | PRN
  Administered 2018-02-12: 500 [IU]
  Filled 2018-02-12: qty 5

## 2018-02-12 MED ORDER — SODIUM CHLORIDE 0.9 % IV SOLN
20.0000 mg | Freq: Once | INTRAVENOUS | Status: AC
  Administered 2018-02-12: 20 mg via INTRAVENOUS
  Filled 2018-02-12: qty 2

## 2018-02-12 MED ORDER — ZOLEDRONIC ACID 4 MG/100ML IV SOLN
4.0000 mg | Freq: Once | INTRAVENOUS | Status: AC
  Administered 2018-02-12: 4 mg via INTRAVENOUS
  Filled 2018-02-12: qty 100

## 2018-02-12 MED ORDER — ACETAMINOPHEN 325 MG PO TABS
650.0000 mg | ORAL_TABLET | Freq: Once | ORAL | Status: AC
  Administered 2018-02-12: 650 mg via ORAL
  Filled 2018-02-12: qty 2

## 2018-02-12 MED ORDER — SODIUM CHLORIDE 0.9% FLUSH
10.0000 mL | INTRAVENOUS | Status: DC | PRN
  Administered 2018-02-12: 10 mL
  Filled 2018-02-12: qty 10

## 2018-02-12 MED ORDER — SODIUM CHLORIDE 0.9 % IV SOLN
Freq: Once | INTRAVENOUS | Status: AC
  Administered 2018-02-12: 10:00:00 via INTRAVENOUS

## 2018-02-12 MED ORDER — SODIUM CHLORIDE 0.9 % IV SOLN
1800.0000 mg | Freq: Once | INTRAVENOUS | Status: AC
  Administered 2018-02-12: 1800 mg via INTRAVENOUS
  Filled 2018-02-12: qty 80

## 2018-02-12 MED ORDER — DIPHENHYDRAMINE HCL 25 MG PO CAPS
50.0000 mg | ORAL_CAPSULE | Freq: Once | ORAL | Status: AC
  Administered 2018-02-12: 50 mg via ORAL
  Filled 2018-02-12: qty 2

## 2018-02-12 NOTE — Progress Notes (Signed)
Pt presents today for Darzalex. VSS. Reviewed ANC of 1.4 with Dr. Delton Coombes. Per MD, " proceed with treatment." Darzalex can be given with low ANC and pt has been taken off Pomalyst PO at this time.   Treatment given today per MD orders. Tolerated infusion without adverse affects. Vital signs stable. No complaints at this time. Discharged from clinic ambulatory. F/U with [facility] as scheduled.

## 2018-02-12 NOTE — Assessment & Plan Note (Signed)
1.  IgG kappa multiple myeloma: - Initially treated with RVD from 04/07/2014 through 07/28/2014, placed on maintenance Revlimid, which was discontinued 1 and half year ago, unknown reasons, patient thinks it was discontinued when he was receiving radiation. -MRI in January of the brain showed new right temporal lesion, status post radiation therapy, PET/CT scan in January 2019 showed improvement of multiple myeloma with no new lesions, started on weekly Velcade and dexamethasone on 02/18/2017, status post 2 cycles, Velcade changed to every other week on 05/06/2017 secondary to neuropathy, takes dexamethasone 20 mg on days of Velcade. - We reviewed the results of the M spike which was stable at 0.3.  Free light chain ratio went up to 4. -Because of persistent back pain, I have done an MRI of the thoracic and lumbar spine.  We discussed the results of the scans today.  He had a new rib lesion close to the spine on the left side, possibly eighth rib.  There is a soft tissue mass in that area.  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  There is also chronic L4 compression fracture which is stable. - Velcade was discontinued secondary to progression.  I have recommended a much stronger regimen containing daratumumab, pomalidomide and dexamethasone.  Daratumumab will be given weekly for the first 8 weeks.  Pomalidomide will be started at a reduced dose of 2 mg, 21 days on, 7 days off.  Dexamethasone will be taken at a flat dose of 10 mg on a weekly basis.  We talked about the side effects of this regimen in detail. -DPd started on 09/17/2017.  He developed erythematous maculopapular rash involving the trunk after taking 3 pills of pomalidomide.  It improved after steroids.  (Pomalidomide dose is 2 mg 3 weeks on 1 week off, dexamethasone doses 10 mg weekly). -Status post XRT to the left eighth rib lesion, 30 Gy in 10 fractions from 09/29/2017 through 10/14/2017. - Daratumumab restarted after  radiation on 10/28/2017. - M spike on 11/25/2017 was 0.3 g. Myeloma panel from today's pending. - Darzalex was switched to every 2 weeks on 12/16/2017. - We have changed to pomalidomide 2 mg to every other day at last visit on 01/22/2018.  He was supposed to stop pomalidomide on the second of this month.  However he was continuing it.  He complained of skin sensitivity and burning sensation.  There is no clear rash.  I have recommended to hold his pomalidomide today for a week. -he will started back in 1 week. -Today he may proceed with daratumumab.  We discussed myeloma blood work from 01/13/2018.  M spike has improved to 0.2, previously 0.3 g/dL.  Free light chain ratio was 1.7, previously 2.88.  Kappa light chains have come down to 14.7, previously 25.3. - He will come back in 4 weeks for follow-up.  2.  Peripheral neuropathy: -She will continue Neurontin 3 times a day for his numbness in the feet and legs.    3.  Bone protection: -He will continue Zometa every 8 weeks.  He will continue calcium and vitamin D.   4.  Back pain: -This is from combination of myeloma and degenerative disc disease. -MRI of the lumbar spine on 01/11/2018 showed diffuse lumbar spine spondylosis.  At L3-L4 there is a broad-based disc bulge with left paracentral disc extrusion with inferior migration of the disc material.  Severe bilateral facet arthropathy.  Severe spinal stenosis.  Severe bilateral foraminal stenosis.  15 mm bone lesion in the left  sacrum consistent with multiple myeloma is stable.   -He is taking oxycodone 10 mg 2 to 3 tablets/day.  He also takes Motrin as needed.  5.  Sleep: - He has used Restoril and trazodone without help.  He thinks that he already used Ambien also without help. -He reports that he takes half a tablet of Xanax with gabapentin and it helps him to sleep.  He will continue that.

## 2018-02-12 NOTE — Patient Instructions (Signed)
[facility] Discharge Instructions for Patients Receiving Chemotherapy  Today you received the following chemotherapy agents   To help prevent nausea and vomiting after your treatment, we encourage you to take your nausea medication   If you develop nausea and vomiting that is not controlled by your nausea medication, call the clinic.   BELOW ARE SYMPTOMS THAT SHOULD BE REPORTED IMMEDIATELY:  *FEVER GREATER THAN 100.5 F  *CHILLS WITH OR WITHOUT FEVER  NAUSEA AND VOMITING THAT IS NOT CONTROLLED WITH YOUR NAUSEA MEDICATION  *UNUSUAL SHORTNESS OF BREATH  *UNUSUAL BRUISING OR BLEEDING  TENDERNESS IN MOUTH AND THROAT WITH OR WITHOUT PRESENCE OF ULCERS  *URINARY PROBLEMS  *BOWEL PROBLEMS  UNUSUAL RASH Items with * indicate a potential emergency and should be followed up as soon as possible.  Feel free to call the clinic should you have any questions or concerns. The clinic phone number is [phone].  Please show the CHEMO ALERT CARD at check-in to the Emergency Department and triage nurse.   

## 2018-02-12 NOTE — Patient Instructions (Signed)
[facility] at [facility] Discharge Instructions     Thank you for choosing [facility] at [facility] to provide your oncology and hematology care.  To afford each patient quality time with our provider, please arrive at least 15 minutes before your scheduled appointment time.   If you have a lab appointment with the Cancer Center please come in thru the  Main Entrance and check in at the main information desk  You need to re-schedule your appointment should you arrive 10 or more minutes late.  We strive to give you quality time with our providers, and arriving late affects you and other patients whose appointments are after yours.  Also, if you no show three or more times for appointments you may be dismissed from the clinic at the providers discretion.     Again, thank you for choosing [facility].  Our hope is that these requests will decrease the amount of time that you wait before being seen by our physicians.       _____________________________________________________________  Should you have questions after your visit to [facility], please contact our office at [phone] between the hours of 8:00 a.m. and 4:30 p.m.  Voicemails left after 4:00 p.m. will not be returned until the following business day.  For prescription refill requests, have your pharmacy contact our office and allow 72 hours.    Cancer Center Support Programs:   > Cancer Support Group  2nd Tuesday of the month 1pm-2pm, Journey Room    

## 2018-02-12 NOTE — Progress Notes (Signed)
[facility] [place], [place] [address]   CLINIC:  Medical Oncology/Hematology  PCP:  [name], MD [address] [place] [address] [phone]   REASON FOR VISIT: Follow-up for multiple myeloma  CURRENT THERAPY: pomalidomide every other day, daratumumab (DARZALEX), and dexamethasoneevery 2 weeks   BRIEF ONCOLOGIC HISTORY:    Multiple myeloma without remission ([place])   11/16/2012 Initial Diagnosis    L occipital condyle destructive lesion, not biopsied secondary to location. XRT    12/01/2012 Imaging    L3 superior endplate compression FX, no lytic lesions to suggest myeloma    02/06/2014 Imaging    soft tissue mass along the right posterior fifth rib with some rib destruction    02/16/2014 Miscellaneous    Normal CBC, Normal CMP, kappa,lamda ratio of 2.62 (abnormal), UIEP with slightly restricted band, IgG kappa, SPEP/IEP with monoclonal protein at 0.79 g/dl, normal igG, suppressed IGM    02/20/2014 Bone Marrow Biopsy    33% kappa restricted plasma cells Normal FISH, normal cytogenetics    02/21/2014 - 03/08/2014 Radiation Therapy    30Gy to R rib lesion, lesion not biopsied    03/16/2014 PET scan    Scattered hypermetabolic osseous lesions, including the calvarium, ribs, left scapula, sacrum, and left femoral shaft. An expansile left vertebral body lesion at L3 extends into the epidural space of the left lateral recess,     03/17/2014 - 04/07/2014 Chemotherapy    Velcade and Dexamethasone due to initial denial of Revlimid by insurance company.  Zometa monthly.    03/22/2014 Imaging    MR_ L-Spine- Enhancing lesions compatible with multiple myeloma at L2, L3, and S1-2.    04/07/2014 - 07/28/2014 Chemotherapy    RVD with Revlimid at 25 mg days 1-14.  Revlimid dose reduced to 25 mg every other day x 14 days with 7 day respite beginning on day 1 of cycle 2.    04/17/2014 Adverse Reaction    Revlimid-induced rash.  Treated with  steroids.    07/28/2014 - 08/04/2014 Chemotherapy    Revlimid daily    08/04/2014 Adverse Reaction    Velcade-induced peripheral neuropathy    08/04/2014 Treatment Plan Change    D/C Velcade    08/11/2014 PET scan    Response to therapy. Improvement and resolution of foci of osseous hypermetabolism.    08/22/2014 - 08/28/2014 Chemotherapy    Revlimid 25 mg days 1-21 every 28 days    08/28/2014 Adverse Reaction    Revlimid-induced rash. Medication held.  Medrol dose Pak prescribed.    09/04/2014 - 01/08/2015 Chemotherapy    Revlimid 10 mg every other day, without dexamethasone    01/08/2015 - 01/15/2015 Hospital Admission    Colonic diverticular abscess with IR drain placement    03/02/2015 PET scan    Only bony uptake is low activity at site of deformity/callus at R second rib FX, high activity in sigmoid colon, advanced sigmoid divertidulosis. Abscess not resolved?    06/04/2015 Imaging    CT nonobstructive L renal calculus, diverticulosis of descending and sigmoid colon without inflammation, moderate prostatic enlargement    07/12/2015 Surgery    Diverticulitis s/p robotic sigmoid colectomy with Dr. Johney Maine    08/23/2015 PET scan    Primarily similar hypermetabolic osseous foci within the R sided ribs. new L third rib focus of hypermetab and sclerosis is favored to be related to healing FX. no soft tissue myeloma id'd. Presumed postop hypermetab and edema about the  R pelvic wall     03/06/2016 PET scan    1. Reduced activity in the prior lesion such as the right second and fifth rib lesions, activity nearly completely resolved and significantly less than added mediastinal blood pool activity. No new lesions are identified. 2. Other imaging findings of potential clinical significance: Coronary, aortic arch, and branch vessel atherosclerotic vascular disease. Aortoiliac atherosclerotic vascular disease. Enlarged prostate gland. Colonic diverticula.    02/17/2017 PET scan    HEAD/NECK: No  hypermetabolic activity in the scalp. No hypermetabolic cervical lymph nodes. CHEST: No hypermetabolic mediastinal or hilar nodes. Right upper lobe scarring/atelectasis. No suspicious pulmonary nodules on the CT scan. ABDOMEN/PELVIS: No abnormal hypermetabolic activity within the liver, pancreas, adrenal glands, or spleen. No hypermetabolic lymph nodes in the abdomen or pelvis. Atherosclerotic calcifications of the abdominal aorta and branch vessels. Left renal sinus cysts. Sigmoid diverticulosis, without evidence of diverticulitis. Prostatomegaly. SKELETON: Vague hypermetabolism involving the inferior sternum, max SUV 3.2, previously 8.2. Focal hypermetabolism involving the left sacrum, max SUV 4.0, previously 6.7. EXTREMITIES: No abnormal hypermetabolic activity in the lower extremities.    02/18/2017 -  Chemotherapy    Bortezomib 1.46m/m2 QWk + Dexamethasone 13mQTue/Fri --Cycle #1, 02/18/17     02/18/2017 - 08/26/2017 Chemotherapy    The patient had bortezomib SQ (VELCADE) chemo injection 3 mg, 1.3 mg/m2 = 3 mg, Subcutaneous,  Once, 7 of 9 cycles Administration: 3 mg (02/18/2017), 3 mg (02/26/2017), 3 mg (03/04/2017), 3 mg (03/11/2017), 3 mg (04/08/2017), 3 mg (03/18/2017), 3 mg (03/25/2017), 3 mg (04/01/2017), 3 mg (05/06/2017), 3 mg (05/20/2017), 3 mg (06/03/2017), 3 mg (06/17/2017), 3 mg (07/01/2017), 3 mg (07/15/2017), 3 mg (07/29/2017), 3 mg (08/12/2017), 3 mg (08/26/2017)  for chemotherapy treatment.     09/17/2017 -  Chemotherapy    The patient had daratumumab (DARZALEX) 1,800 mg in sodium chloride 0.9 % 910 mL (1.8 mg/mL) chemo infusion, 15.9 mg/kg = 1,820 mg, Intravenous, Once, 1 of 1 cycle Administration: 1,800 mg (09/17/2017) daratumumab (DARZALEX) 1,800 mg in sodium chloride 0.9 % 410 mL (3.6 mg/mL) chemo infusion, 1,820 mg, Intravenous, Once, 4 of 7 cycles Administration: 1,800 mg (10/28/2017), 1,800 mg (11/04/2017), 1,800 mg (11/11/2017), 1,800 mg (11/18/2017), 1,800 mg (11/25/2017), 1,800 mg (12/02/2017),  1,800 mg (12/09/2017), 1,800 mg (12/16/2017), 1,800 mg (12/30/2017), 1,800 mg (01/13/2018)  for chemotherapy treatment.       INTERVAL HISTORY:  Mr. He[age]mott[age].o. male returns for routine follow-up for multiple myeloma. He is starting to notice places on his skin mostly his head and back that are rough patchy areas. They burn and itch. No redness or swelling. His pain is about the same as it was. Denies any nausea, vomiting, or diarrhea. Denies any new pains. Had not noticed any recent bleeding such as epistaxis, hematuria or hematochezia. Denies recent chest pain on exertion, shortness of breath on minimal exertion, pre-syncopal episodes, or palpitations. Denies any numbness or tingling in hands or feet. Denies any recent fevers, infections, or recent hospitalizations. He reports his appetite and energy level at 50%.     REVIEW OF SYSTEMS:  Review of Systems  Constitutional: Positive for fatigue.  Musculoskeletal: Positive for back pain.  Skin: Positive for itching and rash.  Neurological: Positive for extremity weakness.  All other systems reviewed and are negative.    PAST MEDICAL/SURGICAL HISTORY:  Past Medical History:  Diagnosis Date  . BPH (benign prostatic hyperplasia)   . Colonic diverticular abscess 01/08/2015  . Diverticulitis 1132/44/01 complicated by abscess and  required percutaneous drainage  . GERD (gastroesophageal reflux disease)   . H/O ETOH abuse   . History of chemotherapy last done jan 2017  . History of radiation therapy 01/05/13-02/10/13   45 gray to left occipital condyle region  . History of radiation therapy 12/01/16-12/10/16   Parasternal nodule, chest- 24 Gy total delivered in 8 fractions, Left sacro-iliac, pelvis- 24 Gy total delivered in 8 fractions   . History of radiation therapy 02/19/17-02/22/17   right temporal scalp 30 Gy in 10 fractions  . Intra-abdominal abscess ([place])   . Multiple myeloma ([name]) 2015  . Neuropathy   . Radiation 02/21/14-03/08/14     right posterior chest wall area 30 gray  . Radiation 04/19/14-05/02/14   lumbar spine 25 gray  . Skull lesion    Left occipital condyle  . Wrist fracture, left    x 2   Past Surgical History:  Procedure Laterality Date  . COLONOSCOPY N/A 04/19/2015   Procedure: COLONOSCOPY;  Surgeon: Danie Binder, MD;  Location: [facility];  Service: Endoscopy;  Laterality: N/A;  1:30 PM  . CYST EXCISION  1959   tail bone  . IR IMAGING GUIDED PORT INSERTION  09/25/2017     SOCIAL HISTORY:  Social History   Socioeconomic History  . Marital status: Married    Spouse name: Not on file  . Number of children: 3  . Years of education: Not on file  . Highest education level: Not on file  Occupational History    Employer: RETIRED  Social Needs  . Financial resource strain: Not on file  . Food insecurity:    Worry: Not on file    Inability: Not on file  . Transportation needs:    Medical: Not on file    Non-medical: Not on file  Tobacco Use  . Smoking status: Never Smoker  . Smokeless tobacco: Never Used  Substance and Sexual Activity  . Alcohol use: No    Comment: " Not much no more"  . Drug use: No  . Sexual activity: Not on file  Lifestyle  . Physical activity:    Days per week: Not on file    Minutes per session: Not on file  . Stress: Not on file  Relationships  . Social connections:    Talks on phone: Not on file    Gets together: Not on file    Attends religious service: Not on file    Active member of club or organization: Not on file    Attends meetings of clubs or organizations: Not on file    Relationship status: Not on file  . Intimate partner violence:    Fear of current or ex partner: Not on file    Emotionally abused: Not on file    Physically abused: Not on file    Forced sexual activity: Not on file  Other Topics Concern  . Not on file  Social History Narrative  . Not on file    FAMILY HISTORY:  Family History  Problem Relation Age of Onset  .  Diabetes Mother   . Stroke Mother   . Hypertension Father     CURRENT MEDICATIONS:  Outpatient Encounter Medications as of 02/12/2018  Medication Sig Note  . acyclovir (ZOVIRAX) 400 MG tablet Take 1 tablet (400 mg total) by mouth 2 (two) times daily.   Marland Kitchen ALPRAZolam (XANAX) 1 MG tablet TAKE 2 TABLETS BY MOUTH AT BEDTIME AS NEEDED FOR ANXIETY OR SLEEP.   Marland Kitchen aspirin EC  325 MG tablet Take 1 tablet (325 mg total) by mouth daily.   Marland Kitchen CALCIUM-VITAMIN D PO Take 1 tablet by mouth 2 (two) times daily.    Marland Kitchen dexamethasone (DECADRON) 4 MG tablet Take 2.5 tablets (10 mg total) by mouth once a week. Take 10 mg once weekly 09/18/2017: Started on 09/18/17  . furosemide (LASIX) 20 MG tablet TAKE 1 TABLET BY MOUTH DAILY AS NEEDED FOR EDEMA.   Marland Kitchen gabapentin (NEURONTIN) 400 MG capsule Take 1 capsule (400 mg total) by mouth 2 (two) times daily.   Marland Kitchen lidocaine-prilocaine (EMLA) cream Apply small amount over port one (1) hour prior to appointment.   Marland Kitchen MAGNESIUM PO Take 1 tablet by mouth at bedtime.   Marland Kitchen omeprazole (PRILOSEC) 20 MG capsule Take 1 capsule (20 mg total) by mouth daily.   . Oxycodone HCl 10 MG TABS Take 1 tablet (10 mg total) by mouth every 8 (eight) hours as needed.   . pomalidomide (POMALYST) 2 MG capsule Take 1 capsule (2 mg total) by mouth every other day.   . tamsulosin (FLOMAX) 0.4 MG CAPS capsule Take 1 capsule (0.4 mg total) by mouth at bedtime.   . temazepam (RESTORIL) 15 MG capsule TAKE 1 CAPSULE BY MOUTH AT BEDTIME AS NEEDED FOR SLEEP (Patient not taking: Reported on 01/22/2018)   . traZODone (DESYREL) 100 MG tablet Take 1 tablet (100 mg total) by mouth at bedtime. (Patient not taking: Reported on 01/22/2018)   . zolendronic acid (ZOMETA) 4 MG/5ML injection Inject 4 mg into the vein every 30 (thirty) days.     No facility-administered encounter medications on file as of 02/12/2018.     ALLERGIES:  Allergies  Allergen Reactions  . Codeine Other (See Comments)    Headache  . Morphine And  Related Other (See Comments)    Makes him feel weird  . Revlimid [Lenalidomide] Other (See Comments)    "Causes me to become weak"     PHYSICAL EXAM:  ECOG Performance status: 1  Vitals:   02/12/18 0849  BP: (!) 143/66  Pulse: 80  Resp: 18  Temp: (!) 97.5 F (36.4 C)  SpO2: 96%   Filed Weights   02/12/18 0849  Weight: 240 lb 8 oz (109.1 kg)    Physical Exam Constitutional:      Appearance: Normal appearance. He is normal weight.  Cardiovascular:     Rate and Rhythm: Normal rate and regular rhythm.     Heart sounds: Normal heart sounds.  Pulmonary:     Effort: Pulmonary effort is normal.     Breath sounds: Normal breath sounds.  Musculoskeletal: Normal range of motion.  Skin:    General: Skin is warm and dry.  Neurological:     Mental Status: He is alert and oriented to person, place, and time. Mental status is at baseline.  Psychiatric:        Mood and Affect: Mood normal.        Behavior: Behavior normal.        Thought Content: Thought content normal.        Judgment: Judgment normal.      LABORATORY DATA:  I have reviewed the labs as listed.  CBC    Component Value Date/Time   WBC 3.2 (L) 02/12/2018 0826   RBC 4.69 02/12/2018 0826   HGB 13.5 02/12/2018 0826   HCT 42.8 02/12/2018 0826   PLT 148 (L) 02/12/2018 0826   MCV 91.3 02/12/2018 0826   MCH 28.8 02/12/2018 0826  MCHC 31.5 02/12/2018 0826   RDW 14.2 02/12/2018 0826   LYMPHSABS 1.0 02/12/2018 0826   MONOABS 0.7 02/12/2018 0826   EOSABS 0.1 02/12/2018 0826   BASOSABS 0.1 02/12/2018 0826   CMP Latest Ref Rng & Units 02/12/2018 01/13/2018 12/30/2017  Glucose 70 - 99 mg/dL 115(H) 110(H) 109(H)  BUN 8 - 23 mg/dL 15 13 15   Creatinine 0.61 - 1.24 mg/dL 0.71 0.79 0.74  Sodium 135 - 145 mmol/L 138 140 139  Potassium 3.5 - 5.1 mmol/L 4.2 4.3 4.4  Chloride 98 - 111 mmol/L 107 105 108  CO2 22 - 32 mmol/L 25 28 24   Calcium 8.9 - 10.3 mg/dL 8.7(L) 9.3 8.6(L)  Total Protein 6.5 - 8.1 g/dL 5.8(L)  6.1(L) 6.3(L)  Total Bilirubin 0.3 - 1.2 mg/dL 0.6 0.6 1.0  Alkaline Phos 38 - 126 U/L 22(L) 21(L) 22(L)  AST 15 - 41 U/L 15 17 16   ALT 0 - 44 U/L 13 15 13        DIAGNOSTIC IMAGING:  I have independently reviewed the scans and discussed with the patient.   I have reviewed Francene Finders, NP's note and agree with the documentation.  I personally performed a face-to-face visit, made revisions and my assessment and plan is as follows.    ASSESSMENT & PLAN:   Multiple myeloma without remission ([facility]) 1.  IgG kappa multiple myeloma: - Initially treated with RVD from 04/07/2014 through 07/28/2014, placed on maintenance Revlimid, which was discontinued 1 and half year ago, unknown reasons, patient thinks it was discontinued when he was receiving radiation. -MRI in January of the brain showed new right temporal lesion, status post radiation therapy, PET/CT scan in January 2019 showed improvement of multiple myeloma with no new lesions, started on weekly Velcade and dexamethasone on 02/18/2017, status post 2 cycles, Velcade changed to every other week on 05/06/2017 secondary to neuropathy, takes dexamethasone 20 mg on days of Velcade. - We reviewed the results of the M spike which was stable at 0.3.  Free light chain ratio went up to 4. -Because of persistent back pain, I have done an MRI of the thoracic and lumbar spine.  We discussed the results of the scans today.  He had a new rib lesion close to the spine on the left side, possibly eighth rib.  There is a soft tissue mass in that area.  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  There is also chronic L4 compression fracture which is stable. - Velcade was discontinued secondary to progression.  I have recommended a much stronger regimen containing daratumumab, pomalidomide and dexamethasone.  Daratumumab will be given weekly for the first 8 weeks.  Pomalidomide will be started at a reduced dose of 2 mg, 21 days on, 7 days off.   Dexamethasone will be taken at a flat dose of 10 mg on a weekly basis.  We talked about the side effects of this regimen in detail. -DPd started on 09/17/2017.  He developed erythematous maculopapular rash involving the trunk after taking 3 pills of pomalidomide.  It improved after steroids.  (Pomalidomide dose is 2 mg 3 weeks on 1 week off, dexamethasone doses 10 mg weekly). -Status post XRT to the left eighth rib lesion, 30 Gy in 10 fractions from 09/29/2017 through 10/14/2017. - Daratumumab restarted after radiation on 10/28/2017. - M spike on 11/25/2017 was 0.3 g. Myeloma panel from today's pending. - Darzalex was switched to every 2 weeks on 12/16/2017. - We have changed to pomalidomide 2 mg  to every other day at last visit on 01/22/2018.  He was supposed to stop pomalidomide on the second of this month.  However he was continuing it.  He complained of skin sensitivity and burning sensation.  There is no clear rash.  I have recommended to hold his pomalidomide today for a week. -he will started back in 1 week. -Today he may proceed with daratumumab.  We discussed myeloma blood work from 01/13/2018.  M spike has improved to 0.2, previously 0.3 g/dL.  Free light chain ratio was 1.7, previously 2.88.  Kappa light chains have come down to 14.7, previously 25.3. - He will come back in 4 weeks for follow-up.  2.  Peripheral neuropathy: -She will continue Neurontin 3 times a day for his numbness in the feet and legs.    3.  Bone protection: -He will continue Zometa every 8 weeks.  He will continue calcium and vitamin D.   4.  Back pain: -This is from combination of myeloma and degenerative disc disease. -MRI of the lumbar spine on 01/11/2018 showed diffuse lumbar spine spondylosis.  At L3-L4 there is a broad-based disc bulge with left paracentral disc extrusion with inferior migration of the disc material.  Severe bilateral facet arthropathy.  Severe spinal stenosis.  Severe bilateral foraminal stenosis.   15 mm bone lesion in the left sacrum consistent with multiple myeloma is stable.   -He is taking oxycodone 10 mg 2 to 3 tablets/day.  He also takes Motrin as needed.  5.  Sleep: - He has used Restoril and trazodone without help.  He thinks that he already used Ambien also without help. -He reports that he takes half a tablet of Xanax with gabapentin and it helps him to sleep.  He will continue that.      Orders placed this encounter:  Orders Placed This Encounter  Procedures  . Lactate dehydrogenase  . Protein electrophoresis, serum  . Kappa/lambda light chains  . CBC with Differential/Platelet  . Comprehensive metabolic panel      Derek Jack, MD [place] [phone]

## 2018-02-12 NOTE — Progress Notes (Signed)
Patient taking pomalyst as prescribed. Has not missed any doses & reports having some scaly places show up on head, face, and upper body .

## 2018-02-15 ENCOUNTER — Other Ambulatory Visit (HOSPITAL_COMMUNITY): Payer: Self-pay | Admitting: *Deleted

## 2018-02-15 ENCOUNTER — Other Ambulatory Visit: Payer: Self-pay | Admitting: Family Medicine

## 2018-02-15 ENCOUNTER — Encounter (HOSPITAL_COMMUNITY): Payer: Self-pay | Admitting: *Deleted

## 2018-02-15 DIAGNOSIS — C9000 Multiple myeloma not having achieved remission: Secondary | ICD-10-CM

## 2018-02-15 DIAGNOSIS — G4700 Insomnia, unspecified: Secondary | ICD-10-CM

## 2018-02-15 NOTE — Progress Notes (Signed)
Per Dr. Delton Coombes, patient is to stop taking the acyclovir along with the pomalyst as it could also be causing his rash. He is to continue using the benadryl for itching. I talked with patient over the telephone and gave him those instructions. He verbalizes understanding.

## 2018-02-15 NOTE — Telephone Encounter (Signed)
Ok to refill??  Last office visit 06/30/2017.  Last refill 12/08/2017, #1 refills.

## 2018-02-16 MED ORDER — OXYCODONE HCL 10 MG PO TABS: 10.0000 mg | ORAL_TABLET | Freq: Three times a day (TID) | ORAL | 0 refills | Status: DC | PRN

## 2018-02-17 ENCOUNTER — Other Ambulatory Visit (HOSPITAL_COMMUNITY): Payer: Self-pay | Admitting: Hematology

## 2018-02-17 DIAGNOSIS — N400 Enlarged prostate without lower urinary tract symptoms: Secondary | ICD-10-CM

## 2018-02-24 ENCOUNTER — Other Ambulatory Visit (HOSPITAL_COMMUNITY): Payer: Self-pay | Admitting: Hematology

## 2018-02-26 ENCOUNTER — Inpatient Hospital Stay (HOSPITAL_COMMUNITY): Payer: Medicare Other

## 2018-02-26 ENCOUNTER — Encounter (HOSPITAL_COMMUNITY): Payer: Self-pay

## 2018-02-26 VITALS — BP 107/63 | HR 61 | Temp 97.5°F | Resp 18 | Wt 238.9 lb

## 2018-02-26 DIAGNOSIS — Z5112 Encounter for antineoplastic immunotherapy: Secondary | ICD-10-CM | POA: Diagnosis not present

## 2018-02-26 DIAGNOSIS — C9000 Multiple myeloma not having achieved remission: Secondary | ICD-10-CM

## 2018-02-26 LAB — CBC WITH DIFFERENTIAL/PLATELET
Abs Immature Granulocytes: 0.02 10*3/uL (ref 0.00–0.07)
Basophils Absolute: 0 10*3/uL (ref 0.0–0.1)
Basophils Relative: 1 %
Eosinophils Absolute: 0.1 10*3/uL (ref 0.0–0.5)
Eosinophils Relative: 2 %
HCT: 43.9 % (ref 39.0–52.0)
Hemoglobin: 14 g/dL (ref 13.0–17.0)
Immature Granulocytes: 1 %
Lymphocytes Relative: 18 %
Lymphs Abs: 0.8 10*3/uL (ref 0.7–4.0)
MCH: 29.7 pg (ref 26.0–34.0)
MCHC: 31.9 g/dL (ref 30.0–36.0)
MCV: 93.2 fL (ref 80.0–100.0)
Monocytes Absolute: 0.3 10*3/uL (ref 0.1–1.0)
Monocytes Relative: 8 %
Neutro Abs: 3 10*3/uL (ref 1.7–7.7)
Neutrophils Relative %: 70 %
Platelets: 147 10*3/uL — ABNORMAL LOW (ref 150–400)
RBC: 4.71 MIL/uL (ref 4.22–5.81)
RDW: 14.2 % (ref 11.5–15.5)
WBC: 4.3 10*3/uL (ref 4.0–10.5)
nRBC: 0 % (ref 0.0–0.2)

## 2018-02-26 LAB — COMPREHENSIVE METABOLIC PANEL
ALT: 11 U/L (ref 0–44)
AST: 16 U/L (ref 15–41)
Albumin: 3.3 g/dL — ABNORMAL LOW (ref 3.5–5.0)
Alkaline Phosphatase: 19 U/L — ABNORMAL LOW (ref 38–126)
Anion gap: 6 (ref 5–15)
BUN: 18 mg/dL (ref 8–23)
CO2: 26 mmol/L (ref 22–32)
Calcium: 8.7 mg/dL — ABNORMAL LOW (ref 8.9–10.3)
Chloride: 109 mmol/L (ref 98–111)
Creatinine, Ser: 0.81 mg/dL (ref 0.61–1.24)
GFR calc Af Amer: >60 mL/min (ref >60)
GFR calc non Af Amer: >60 mL/min (ref >60)
Glucose, Bld: 118 mg/dL — ABNORMAL HIGH (ref 70–99)
Potassium: 4.2 mmol/L (ref 3.5–5.1)
Sodium: 141 mmol/L (ref 135–145)
Total Bilirubin: 0.7 mg/dL (ref 0.3–1.2)
Total Protein: 5.7 g/dL — ABNORMAL LOW (ref 6.5–8.1)

## 2018-02-26 MED ORDER — ACETAMINOPHEN 325 MG PO TABS
650.0000 mg | ORAL_TABLET | Freq: Once | ORAL | Status: AC
  Administered 2018-02-26: 650 mg via ORAL
  Filled 2018-02-26: qty 2

## 2018-02-26 MED ORDER — SODIUM CHLORIDE 0.9 % IV SOLN
1800.0000 mg | Freq: Once | INTRAVENOUS | Status: AC
  Administered 2018-02-26: 1800 mg via INTRAVENOUS
  Filled 2018-02-26: qty 80

## 2018-02-26 MED ORDER — SODIUM CHLORIDE 0.9 % IV SOLN
20.0000 mg | Freq: Once | INTRAVENOUS | Status: DC
  Filled 2018-02-26 (×3): qty 2

## 2018-02-26 MED ORDER — SODIUM CHLORIDE 0.9 % IV SOLN
20.0000 mg | Freq: Once | INTRAVENOUS | Status: AC
  Administered 2018-02-26: 20 mg via INTRAVENOUS
  Filled 2018-02-26: qty 2

## 2018-02-26 MED ORDER — SODIUM CHLORIDE 0.9% FLUSH
10.0000 mL | INTRAVENOUS | Status: DC | PRN
  Administered 2018-02-26: 10 mL
  Filled 2018-02-26: qty 10

## 2018-02-26 MED ORDER — PROCHLORPERAZINE MALEATE 10 MG PO TABS
10.0000 mg | ORAL_TABLET | Freq: Once | ORAL | Status: AC
  Administered 2018-02-26: 10 mg via ORAL
  Filled 2018-02-26: qty 1

## 2018-02-26 MED ORDER — DIPHENHYDRAMINE HCL 25 MG PO CAPS
50.0000 mg | ORAL_CAPSULE | Freq: Once | ORAL | Status: AC
  Administered 2018-02-26: 50 mg via ORAL
  Filled 2018-02-26: qty 2

## 2018-02-26 MED ORDER — HEPARIN SOD (PORK) LOCK FLUSH 100 UNIT/ML IV SOLN
500.0000 [IU] | Freq: Once | INTRAVENOUS | Status: AC | PRN
  Administered 2018-02-26: 500 [IU]
  Filled 2018-02-26: qty 5

## 2018-02-26 MED ORDER — SODIUM CHLORIDE 0.9 % IV SOLN
Freq: Once | INTRAVENOUS | Status: AC
  Administered 2018-02-26: 11:00:00 via INTRAVENOUS

## 2018-02-26 NOTE — Patient Instructions (Signed)
[facility] Discharge Instructions for Patients Receiving Chemotherapy   Beginning January 23rd 2017 lab work for the [facility] will be done in the  Main lab at [facility] on 1st floor. If you have a lab appointment with the [facility] please come in thru the  Main Entrance and check in at the main information desk   Today you received the following chemotherapy agents Daratumumab infusion. Follow-up as scheduled. Call clinic for any questions or concerns  To help prevent nausea and vomiting after your treatment, we encourage you to take your nausea medication   If you develop nausea and vomiting, or diarrhea that is not controlled by your medication, call the clinic.  The clinic phone number is [phone]. Office hours are Monday-Friday 8:30am-5:00pm.  BELOW ARE SYMPTOMS THAT SHOULD BE REPORTED IMMEDIATELY:  *FEVER GREATER THAN 101.0 F  *CHILLS WITH OR WITHOUT FEVER  NAUSEA AND VOMITING THAT IS NOT CONTROLLED WITH YOUR NAUSEA MEDICATION  *UNUSUAL SHORTNESS OF BREATH  *UNUSUAL BRUISING OR BLEEDING  TENDERNESS IN MOUTH AND THROAT WITH OR WITHOUT PRESENCE OF ULCERS  *URINARY PROBLEMS  *BOWEL PROBLEMS  UNUSUAL RASH Items with * indicate a potential emergency and should be followed up as soon as possible. If you have an emergency after office hours please contact your primary care physician or go to the nearest emergency department.  Please call the clinic during office hours if you have any questions or concerns.   You may also contact the Patient Navigator at [phone] should you have any questions or need assistance in obtaining follow up care.      Resources For Cancer Patients and their Caregivers ? American Cancer Society: Can assist with transportation, wigs, general needs, runs Look Good Feel Better.        (217)812-3372 ? Cancer Care: Provides financial assistance, online support groups, medication/co-pay assistance.   1-800-813-HOPE 725-190-9478) ? [facility] Assists [place] cancer patients and their families through emotional , educational and financial support.  704-798-0089 ? [place] DSS Where to apply for food stamps, Medicaid and utility assistance. (617) 613-2837 ? [facility]: Transportation to medical appointments. (914) 182-8596 ? Social Security Administration: May apply for disability if have a Stage IV cancer. 682-804-5305 445-560-6867 ? [employer], Disability and Transit Services: Assists with nutrition, care and transit needs. [phone]

## 2018-02-26 NOTE — Progress Notes (Signed)
1030 Labs reviewed with Dr. Delton Coombes and pt was instructed on 02/15/18 to stop his Pomalyst and Accyclovir due to rash and itching. He no longer has a rash and only mild itching which was reported to MD as well. Pt approved for Daratumumab infusion today and to start back on his Accyclovir but continue to hold his Pomalyst until his next MD appt. Pt was informed of this information and verbalized understanding                 Dean [name]. tolerated Darzalex infusion well without complaints or incident. VSS upon discharge. Pt discharged self ambulatory using cane in satisfactory condition accompanied by his wife

## 2018-03-12 ENCOUNTER — Ambulatory Visit (HOSPITAL_COMMUNITY): Payer: Medicare Other | Admitting: Hematology

## 2018-03-12 ENCOUNTER — Ambulatory Visit (HOSPITAL_COMMUNITY): Payer: Medicare Other

## 2018-03-12 ENCOUNTER — Other Ambulatory Visit (HOSPITAL_COMMUNITY): Payer: Medicare Other

## 2018-03-15 ENCOUNTER — Encounter (HOSPITAL_COMMUNITY): Payer: Self-pay | Admitting: Hematology

## 2018-03-15 ENCOUNTER — Inpatient Hospital Stay (HOSPITAL_BASED_OUTPATIENT_CLINIC_OR_DEPARTMENT_OTHER): Payer: Medicare Other | Admitting: Hematology

## 2018-03-15 ENCOUNTER — Other Ambulatory Visit: Payer: Self-pay

## 2018-03-15 ENCOUNTER — Inpatient Hospital Stay (HOSPITAL_COMMUNITY): Payer: Medicare Other | Attending: Hematology

## 2018-03-15 ENCOUNTER — Inpatient Hospital Stay (HOSPITAL_COMMUNITY): Payer: Medicare Other

## 2018-03-15 ENCOUNTER — Other Ambulatory Visit (HOSPITAL_COMMUNITY): Payer: Self-pay | Admitting: Nurse Practitioner

## 2018-03-15 VITALS — BP 133/79 | HR 67 | Temp 97.9°F | Resp 16 | Wt 235.4 lb

## 2018-03-15 VITALS — BP 113/56 | HR 57 | Temp 97.7°F | Resp 18

## 2018-03-15 DIAGNOSIS — G629 Polyneuropathy, unspecified: Secondary | ICD-10-CM

## 2018-03-15 DIAGNOSIS — G4700 Insomnia, unspecified: Secondary | ICD-10-CM

## 2018-03-15 DIAGNOSIS — C9000 Multiple myeloma not having achieved remission: Secondary | ICD-10-CM | POA: Insufficient documentation

## 2018-03-15 DIAGNOSIS — M545 Low back pain: Secondary | ICD-10-CM

## 2018-03-15 DIAGNOSIS — Z5112 Encounter for antineoplastic immunotherapy: Secondary | ICD-10-CM | POA: Diagnosis not present

## 2018-03-15 DIAGNOSIS — R350 Frequency of micturition: Secondary | ICD-10-CM

## 2018-03-15 LAB — CBC WITH DIFFERENTIAL/PLATELET
Abs Immature Granulocytes: 0.01 10*3/uL (ref 0.00–0.07)
Basophils Absolute: 0 10*3/uL (ref 0.0–0.1)
Basophils Relative: 1 %
Eosinophils Absolute: 0 10*3/uL (ref 0.0–0.5)
Eosinophils Relative: 1 %
HCT: 44.3 % (ref 39.0–52.0)
Hemoglobin: 13.9 g/dL (ref 13.0–17.0)
Immature Granulocytes: 0 %
Lymphocytes Relative: 22 %
Lymphs Abs: 1 10*3/uL (ref 0.7–4.0)
MCH: 29 pg (ref 26.0–34.0)
MCHC: 31.4 g/dL (ref 30.0–36.0)
MCV: 92.3 fL (ref 80.0–100.0)
Monocytes Absolute: 0.4 10*3/uL (ref 0.1–1.0)
Monocytes Relative: 8 %
Neutro Abs: 3 10*3/uL (ref 1.7–7.7)
Neutrophils Relative %: 68 %
Platelets: 116 10*3/uL — ABNORMAL LOW (ref 150–400)
RBC: 4.8 MIL/uL (ref 4.22–5.81)
RDW: 14.5 % (ref 11.5–15.5)
WBC: 4.4 10*3/uL (ref 4.0–10.5)
nRBC: 0 % (ref 0.0–0.2)

## 2018-03-15 LAB — COMPREHENSIVE METABOLIC PANEL
ALT: 12 U/L (ref 0–44)
AST: 14 U/L — ABNORMAL LOW (ref 15–41)
Albumin: 3.6 g/dL (ref 3.5–5.0)
Alkaline Phosphatase: 21 U/L — ABNORMAL LOW (ref 38–126)
Anion gap: 8 (ref 5–15)
BUN: 19 mg/dL (ref 8–23)
CO2: 28 mmol/L (ref 22–32)
Calcium: 9 mg/dL (ref 8.9–10.3)
Chloride: 105 mmol/L (ref 98–111)
Creatinine, Ser: 0.8 mg/dL (ref 0.61–1.24)
GFR calc Af Amer: >60 mL/min (ref >60)
GFR calc non Af Amer: >60 mL/min (ref >60)
Glucose, Bld: 108 mg/dL — ABNORMAL HIGH (ref 70–99)
Potassium: 4.4 mmol/L (ref 3.5–5.1)
Sodium: 141 mmol/L (ref 135–145)
Total Bilirubin: 0.3 mg/dL (ref 0.3–1.2)
Total Protein: 5.9 g/dL — ABNORMAL LOW (ref 6.5–8.1)

## 2018-03-15 LAB — LACTATE DEHYDROGENASE: LDH: 127 U/L (ref 98–192)

## 2018-03-15 LAB — URINALYSIS, ROUTINE W REFLEX MICROSCOPIC
Bilirubin Urine: NEGATIVE
Glucose, UA: NEGATIVE mg/dL
Ketones, ur: NEGATIVE mg/dL
Nitrite: POSITIVE — AB
Protein, ur: NEGATIVE mg/dL
Specific Gravity, Urine: 1.015 (ref 1.005–1.030)
pH: 6 (ref 5.0–8.0)

## 2018-03-15 LAB — URINALYSIS, MICROSCOPIC (REFLEX)
Squamous Epithelial / HPF: NONE SEEN (ref 0–5)
WBC, UA: >50 WBC/hpf (ref 0–5)

## 2018-03-15 MED ORDER — ACETAMINOPHEN 325 MG PO TABS
650.0000 mg | ORAL_TABLET | Freq: Once | ORAL | Status: AC
  Administered 2018-03-15: 650 mg via ORAL
  Filled 2018-03-15: qty 2

## 2018-03-15 MED ORDER — ACYCLOVIR 400 MG PO TABS: 400.0000 mg | ORAL_TABLET | Freq: Two times a day (BID) | ORAL | 3 refills | Status: DC

## 2018-03-15 MED ORDER — SODIUM CHLORIDE 0.9% FLUSH
10.0000 mL | INTRAVENOUS | Status: DC | PRN
  Administered 2018-03-15: 10 mL
  Filled 2018-03-15: qty 10

## 2018-03-15 MED ORDER — CIPROFLOXACIN HCL 500 MG PO TABS: 500.0000 mg | ORAL_TABLET | Freq: Two times a day (BID) | ORAL | 0 refills | Status: DC

## 2018-03-15 MED ORDER — PROCHLORPERAZINE MALEATE 10 MG PO TABS
10.0000 mg | ORAL_TABLET | Freq: Once | ORAL | Status: AC
  Administered 2018-03-15: 10 mg via ORAL
  Filled 2018-03-15: qty 1

## 2018-03-15 MED ORDER — SODIUM CHLORIDE 0.9 % IV SOLN
1800.0000 mg | Freq: Once | INTRAVENOUS | Status: AC
  Administered 2018-03-15: 1800 mg via INTRAVENOUS
  Filled 2018-03-15: qty 80

## 2018-03-15 MED ORDER — GABAPENTIN 400 MG PO CAPS: 400.0000 mg | ORAL_CAPSULE | Freq: Two times a day (BID) | ORAL | 3 refills | Status: DC

## 2018-03-15 MED ORDER — DIPHENHYDRAMINE HCL 25 MG PO CAPS
50.0000 mg | ORAL_CAPSULE | Freq: Once | ORAL | Status: AC
  Administered 2018-03-15: 50 mg via ORAL
  Filled 2018-03-15: qty 2

## 2018-03-15 MED ORDER — HEPARIN SOD (PORK) LOCK FLUSH 100 UNIT/ML IV SOLN
500.0000 [IU] | Freq: Once | INTRAVENOUS | Status: AC | PRN
  Administered 2018-03-15: 500 [IU]
  Filled 2018-03-15: qty 5

## 2018-03-15 MED ORDER — SODIUM CHLORIDE 0.9 % IV SOLN
20.0000 mg | Freq: Once | INTRAVENOUS | Status: AC
  Administered 2018-03-15: 20 mg via INTRAVENOUS
  Filled 2018-03-15: qty 20

## 2018-03-15 MED ORDER — SODIUM CHLORIDE 0.9 % IV SOLN
Freq: Once | INTRAVENOUS | Status: AC
  Administered 2018-03-15: 10:00:00 via INTRAVENOUS

## 2018-03-15 NOTE — Progress Notes (Signed)
Labs reviewed with Dr. Delton Coombes this morning in office visit. Patient here today for treatment. Proceed today per MD.   Treatment given per orders. Patient tolerated it well without problems. Vitals stable and discharged home from clinic ambulatory. Follow up as scheduled.

## 2018-03-15 NOTE — Progress Notes (Signed)
[place], [place] [id number]   CLINIC:  Medical Oncology/Hematology  PCP:  [name], MD [address] [place]  [phone]   REASON FOR VISIT: Follow-up for multiple myeloma  CURRENT THERAPY:pomalidomideevery otherday, daratumumab (DARZALEX), and dexamethasoneevery 2 weeks   BRIEF ONCOLOGIC HISTORY:    Multiple myeloma without remission ([facility])   11/16/2012 Initial Diagnosis    L occipital condyle destructive lesion, not biopsied secondary to location. XRT    12/01/2012 Imaging    L3 superior endplate compression FX, no lytic lesions to suggest myeloma    02/06/2014 Imaging    soft tissue mass along the right posterior fifth rib with some rib destruction    02/16/2014 Miscellaneous    Normal CBC, Normal CMP, kappa,lamda ratio of 2.62 (abnormal), UIEP with slightly restricted band, IgG kappa, SPEP/IEP with monoclonal protein at 0.79 g/dl, normal igG, suppressed IGM    02/20/2014 Bone Marrow Biopsy    33% kappa restricted plasma cells Normal FISH, normal cytogenetics    02/21/2014 - 03/08/2014 Radiation Therapy    30Gy to R rib lesion, lesion not biopsied    03/16/2014 PET scan    Scattered hypermetabolic osseous lesions, including the calvarium, ribs, left scapula, sacrum, and left femoral shaft. An expansile left vertebral body lesion at L3 extends into the epidural space of the left lateral recess,     03/17/2014 - 04/07/2014 Chemotherapy    Velcade and Dexamethasone due to initial denial of Revlimid by insurance company.  Zometa monthly.    03/22/2014 Imaging    MR_ L-Spine- Enhancing lesions compatible with multiple myeloma at L2, L3, and S1-2.    04/07/2014 - 07/28/2014 Chemotherapy    RVD with Revlimid at 25 mg days 1-14.  Revlimid dose reduced to 25 mg every other day x 14 days with 7 day respite beginning on day 1 of cycle 2.    04/17/2014 Adverse Reaction    Revlimid-induced rash.  Treated with  steroids.    07/28/2014 - 08/04/2014 Chemotherapy    Revlimid daily    08/04/2014 Adverse Reaction    Velcade-induced peripheral neuropathy    08/04/2014 Treatment Plan Change    D/C Velcade    08/11/2014 PET scan    Response to therapy. Improvement and resolution of foci of osseous hypermetabolism.    08/22/2014 - 08/28/2014 Chemotherapy    Revlimid 25 mg days 1-21 every 28 days    08/28/2014 Adverse Reaction    Revlimid-induced rash. Medication held.  Medrol dose Pak prescribed.    09/04/2014 - 01/08/2015 Chemotherapy    Revlimid 10 mg every other day, without dexamethasone    01/08/2015 - 01/15/2015 Hospital Admission    Colonic diverticular abscess with IR drain placement    03/02/2015 PET scan    Only bony uptake is low activity at site of deformity/callus at R second rib FX, high activity in sigmoid colon, advanced sigmoid divertidulosis. Abscess not resolved?    06/04/2015 Imaging    CT nonobstructive L renal calculus, diverticulosis of descending and sigmoid colon without inflammation, moderate prostatic enlargement    07/12/2015 Surgery    Diverticulitis s/p robotic sigmoid colectomy with Dr. Johney Maine    08/23/2015 PET scan    Primarily similar hypermetabolic osseous foci within the R sided ribs. new L third rib focus of hypermetab and sclerosis is favored to be related to healing FX. no soft tissue myeloma id'd. Presumed postop hypermetab and edema about the R pelvic wall  03/06/2016 PET scan    1. Reduced activity in the prior lesion such as the right second and fifth rib lesions, activity nearly completely resolved and significantly less than added mediastinal blood pool activity. No new lesions are identified. 2. Other imaging findings of potential clinical significance: Coronary, aortic arch, and branch vessel atherosclerotic vascular disease. Aortoiliac atherosclerotic vascular disease. Enlarged prostate gland. Colonic diverticula.    02/17/2017 PET scan    HEAD/NECK: No  hypermetabolic activity in the scalp. No hypermetabolic cervical lymph nodes. CHEST: No hypermetabolic mediastinal or hilar nodes. Right upper lobe scarring/atelectasis. No suspicious pulmonary nodules on the CT scan. ABDOMEN/PELVIS: No abnormal hypermetabolic activity within the liver, pancreas, adrenal glands, or spleen. No hypermetabolic lymph nodes in the abdomen or pelvis. Atherosclerotic calcifications of the abdominal aorta and branch vessels. Left renal sinus cysts. Sigmoid diverticulosis, without evidence of diverticulitis. Prostatomegaly. SKELETON: Vague hypermetabolism involving the inferior sternum, max SUV 3.2, previously 8.2. Focal hypermetabolism involving the left sacrum, max SUV 4.0, previously 6.7. EXTREMITIES: No abnormal hypermetabolic activity in the lower extremities.    02/18/2017 -  Chemotherapy    Bortezomib 1.13m/m2 QWk + Dexamethasone 178mQTue/Fri --Cycle #1, 02/18/17     02/18/2017 - 08/26/2017 Chemotherapy    The patient had bortezomib SQ (VELCADE) chemo injection 3 mg, 1.3 mg/m2 = 3 mg, Subcutaneous,  Once, 7 of 9 cycles Administration: 3 mg (02/18/2017), 3 mg (02/26/2017), 3 mg (03/04/2017), 3 mg (03/11/2017), 3 mg (04/08/2017), 3 mg (03/18/2017), 3 mg (03/25/2017), 3 mg (04/01/2017), 3 mg (05/06/2017), 3 mg (05/20/2017), 3 mg (06/03/2017), 3 mg (06/17/2017), 3 mg (07/01/2017), 3 mg (07/15/2017), 3 mg (07/29/2017), 3 mg (08/12/2017), 3 mg (08/26/2017)  for chemotherapy treatment.     09/17/2017 -  Chemotherapy    The patient had daratumumab (DARZALEX) 1,800 mg in sodium chloride 0.9 % 910 mL (1.8 mg/mL) chemo infusion, 15.9 mg/kg = 1,820 mg, Intravenous, Once, 1 of 1 cycle Administration: 1,800 mg (09/17/2017) daratumumab (DARZALEX) 1,800 mg in sodium chloride 0.9 % 410 mL (3.6 mg/mL) chemo infusion, 1,820 mg, Intravenous, Once, 5 of 7 cycles Administration: 1,800 mg (10/28/2017), 1,800 mg (11/04/2017), 1,800 mg (11/11/2017), 1,800 mg (11/18/2017), 1,800 mg (11/25/2017), 1,800 mg (12/02/2017),  1,800 mg (12/09/2017), 1,800 mg (12/16/2017), 1,800 mg (12/30/2017), 1,800 mg (01/13/2018), 1,800 mg (02/12/2018), 1,800 mg (02/26/2018)  for chemotherapy treatment.       INTERVAL HISTORY:  Mr. HeGinley24.o. male returns for routine follow-up for multiple myeloma. He is here today with his wife. He has lost 5 pounds since the beginning of January. He also reports he feels his skin burns and tingles. He also report he is having urinary frequency and burning. The numbness in the feet is stable at this time. Denies any nausea, vomiting, or diarrhea. Denies any new pains. Had not noticed any recent bleeding such as epistaxis, hematuria or hematochezia. Denies recent chest pain on exertion, shortness of breath on minimal exertion, pre-syncopal episodes, or palpitations. Denies any numbness or tingling in hands. Denies any recent fevers, infections, or recent hospitalizations. Patient reports appetite at 0% and energy level at 25%.   REVIEW OF SYSTEMS:  Review of Systems  Musculoskeletal: Positive for back pain.  Neurological: Positive for numbness.  All other systems reviewed and are negative.    PAST MEDICAL/SURGICAL HISTORY:  Past Medical History:  Diagnosis Date  . BPH (benign prostatic hyperplasia)   . Colonic diverticular abscess 01/08/2015  . Diverticulitis 1163/78/58 complicated by abscess and required percutaneous drainage  . GERD (  gastroesophageal reflux disease)   . H/O ETOH abuse   . History of chemotherapy last done jan 2017  . History of radiation therapy 01/05/13-02/10/13   45 gray to left occipital condyle region  . History of radiation therapy 12/01/16-12/10/16   Parasternal nodule, chest- 24 Gy total delivered in 8 fractions, Left sacro-iliac, pelvis- 24 Gy total delivered in 8 fractions   . History of radiation therapy 02/19/17-02/22/17   right temporal scalp 30 Gy in 10 fractions  . Intra-abdominal abscess ([facility])   . Multiple myeloma ([facility]) 2015  . Neuropathy   . Radiation  02/21/14-03/08/14   right posterior chest wall area 30 gray  . Radiation 04/19/14-05/02/14   lumbar spine 25 gray  . Skull lesion    Left occipital condyle  . Wrist fracture, left    x 2   Past Surgical History:  Procedure Laterality Date  . COLONOSCOPY N/A 04/19/2015   Procedure: COLONOSCOPY;  Surgeon: Danie Binder, MD;  Location: [facility];  Service: Endoscopy;  Laterality: N/A;  1:30 PM  . CYST EXCISION  1959   tail bone  . IR IMAGING GUIDED PORT INSERTION  09/25/2017     SOCIAL HISTORY:  Social History   Socioeconomic History  . Marital status: Married    Spouse name: Not on file  . Number of children: 3  . Years of education: Not on file  . Highest education level: Not on file  Occupational History    Employer: RETIRED  Social Needs  . Financial resource strain: Not on file  . Food insecurity:    Worry: Not on file    Inability: Not on file  . Transportation needs:    Medical: Not on file    Non-medical: Not on file  Tobacco Use  . Smoking status: Never Smoker  . Smokeless tobacco: Never Used  Substance and Sexual Activity  . Alcohol use: No    Comment: " Not much no more"  . Drug use: No  . Sexual activity: Not on file  Lifestyle  . Physical activity:    Days per week: Not on file    Minutes per session: Not on file  . Stress: Not on file  Relationships  . Social connections:    Talks on phone: Not on file    Gets together: Not on file    Attends religious service: Not on file    Active member of club or organization: Not on file    Attends meetings of clubs or organizations: Not on file    Relationship status: Not on file  . Intimate partner violence:    Fear of current or ex partner: Not on file    Emotionally abused: Not on file    Physically abused: Not on file    Forced sexual activity: Not on file  Other Topics Concern  . Not on file  Social History Narrative  . Not on file    FAMILY HISTORY:  Family History  Problem Relation Age of  Onset  . Diabetes Mother   . Stroke Mother   . Hypertension Father     CURRENT MEDICATIONS:  Outpatient Encounter Medications as of 03/15/2018  Medication Sig Note  . acyclovir (ZOVIRAX) 400 MG tablet Take 1 tablet (400 mg total) by mouth 2 (two) times daily.   Marland Kitchen ALPRAZolam (XANAX) 1 MG tablet TAKE 2 TABLETS BY MOUTH AT BEDTIME AS NEEDED FOR ANXIETY OR SLEEP   . aspirin EC 325 MG tablet Take 1 tablet (325  mg total) by mouth daily.   Marland Kitchen CALCIUM-VITAMIN D PO Take 1 tablet by mouth 2 (two) times daily.    Marland Kitchen dexamethasone (DECADRON) 4 MG tablet Take 2.5 tablets (10 mg total) by mouth once a week. Take 10 mg once weekly 09/18/2017: Started on 09/18/17  . furosemide (LASIX) 20 MG tablet TAKE 1 TABLET BY MOUTH DAILY AS NEEDED FOR EDEMA.   Marland Kitchen gabapentin (NEURONTIN) 400 MG capsule Take 1 capsule (400 mg total) by mouth 2 (two) times daily.   Marland Kitchen lidocaine-prilocaine (EMLA) cream Apply small amount over port one (1) hour prior to appointment.   Marland Kitchen MAGNESIUM PO Take 1 tablet by mouth at bedtime.   Marland Kitchen omeprazole (PRILOSEC) 20 MG capsule Take 1 capsule (20 mg total) by mouth daily.   . Oxycodone HCl 10 MG TABS Take 1 tablet (10 mg total) by mouth every 8 (eight) hours as needed.   . tamsulosin (FLOMAX) 0.4 MG CAPS capsule Take 1 capsule (0.4 mg total) by mouth at bedtime.   . temazepam (RESTORIL) 15 MG capsule TAKE 1 CAPSULE BY MOUTH AT BEDTIME AS NEEDED FOR SLEEP   . traZODone (DESYREL) 100 MG tablet Take 1 tablet (100 mg total) by mouth at bedtime.   Marland Kitchen zolendronic acid (ZOMETA) 4 MG/5ML injection Inject 4 mg into the vein every 30 (thirty) days.    . [DISCONTINUED] acyclovir (ZOVIRAX) 400 MG tablet Take 1 tablet (400 mg total) by mouth 2 (two) times daily.   . [DISCONTINUED] gabapentin (NEURONTIN) 400 MG capsule Take 1 capsule (400 mg total) by mouth 2 (two) times daily.   . pomalidomide (POMALYST) 2 MG capsule Take 1 capsule (2 mg total) by mouth every other day. (Patient not taking: Reported on 03/15/2018)      No facility-administered encounter medications on file as of 03/15/2018.     ALLERGIES:  Allergies  Allergen Reactions  . Codeine Other (See Comments)    Headache  . Morphine And Related Other (See Comments)    Makes him feel weird  . Revlimid [Lenalidomide] Other (See Comments)    "Causes me to become weak"     PHYSICAL EXAM:  ECOG Performance status: 1  Vitals:   03/15/18 0800  BP: 133/79  Pulse: 67  Resp: 16  Temp: 97.9 F (36.6 C)  SpO2: 95%   Filed Weights   03/15/18 0800  Weight: 235 lb 6 oz (106.8 kg)    Physical Exam Constitutional:      Appearance: Normal appearance. He is normal weight.  Cardiovascular:     Rate and Rhythm: Normal rate and regular rhythm.     Heart sounds: Normal heart sounds.  Pulmonary:     Effort: Pulmonary effort is normal.     Breath sounds: Normal breath sounds.  Musculoskeletal: Normal range of motion.  Skin:    General: Skin is warm and dry.  Neurological:     Mental Status: He is alert and oriented to person, place, and time. Mental status is at baseline.  Psychiatric:        Mood and Affect: Mood normal.        Behavior: Behavior normal.        Thought Content: Thought content normal.        Judgment: Judgment normal.      LABORATORY DATA:  I have reviewed the labs as listed.  CBC    Component Value Date/Time   WBC 4.4 03/15/2018 0819   RBC 4.80 03/15/2018 0819   HGB 13.9 03/15/2018 0819  HCT 44.3 03/15/2018 0819   PLT 116 (L) 03/15/2018 0819   MCV 92.3 03/15/2018 0819   MCH 29.0 03/15/2018 0819   MCHC 31.4 03/15/2018 0819   RDW 14.5 03/15/2018 0819   LYMPHSABS 1.0 03/15/2018 0819   MONOABS 0.4 03/15/2018 0819   EOSABS 0.0 03/15/2018 0819   BASOSABS 0.0 03/15/2018 0819   CMP Latest Ref Rng & Units 03/15/2018 02/26/2018 02/12/2018  Glucose 70 - 99 mg/dL 108(H) 118(H) 115(H)  BUN 8 - 23 mg/dL 19 18 15   Creatinine 0.61 - 1.24 mg/dL 0.80 0.81 0.71  Sodium 135 - 145 mmol/L 141 141 138  Potassium 3.5 - 5.1  mmol/L 4.4 4.2 4.2  Chloride 98 - 111 mmol/L 105 109 107  CO2 22 - 32 mmol/L 28 26 25   Calcium 8.9 - 10.3 mg/dL 9.0 8.7(L) 8.7(L)  Total Protein 6.5 - 8.1 g/dL 5.9(L) 5.7(L) 5.8(L)  Total Bilirubin 0.3 - 1.2 mg/dL 0.3 0.7 0.6  Alkaline Phos 38 - 126 U/L 21(L) 19(L) 22(L)  AST 15 - 41 U/L 14(L) 16 15  ALT 0 - 44 U/L 12 11 13        DIAGNOSTIC IMAGING:  I have independently reviewed the scans and discussed with the patient.   I have reviewed Francene Finders, NP's note and agree with the documentation.  I personally performed a face-to-face visit, made revisions and my assessment and plan is as follows.    ASSESSMENT & PLAN:   Multiple myeloma without remission ([facility]) 1.  IgG kappa multiple myeloma: - RVD from 04/07/2014 through 07/28/2014, maintenance Revlimid which was discontinued 1 and half year ago for unknown reasons.   -MRI in January of the brain showed new right temporal lesion, status post radiation therapy, PET/CT scan in January 2019 showed improvement of multiple myeloma with no new lesions, started on weekly Velcade and dexamethasone on 02/18/2017, status post 2 cycles, Velcade changed to every other week on 05/06/2017 secondary to neuropathy, takes dexamethasone 20 mg on days of Velcade. -Because of persistent back pain, I have done an MRI of the thoracic and lumbar spine.  We discussed the results of the scans today.  He had a new rib lesion close to the spine on the left side, possibly eighth rib.  There is a soft tissue mass in that area.  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  There is also chronic L4 compression fracture which is stable. -DPd started on 09/17/2017.  He developed erythematous maculopapular rash involving the trunk after taking 3 pills of pomalidomide.  It improved after steroids.  (Pomalidomide dose is 2 mg 3 weeks on 1 week off, dexamethasone doses 10 mg weekly). -Status post XRT to the left eighth rib lesion, 30 Gy in 10 fractions from  09/29/2017 through 10/14/2017. - Daratumumab restarted after radiation on 10/28/2017. - M spike on 11/25/2017 was 0.3 g. Myeloma panel from today's pending. - Darzalex was switched to every 2 weeks on 12/16/2017. - Pomalidomide was changed to 2 mg every other day on 01/22/2018. -Pomalidomide was completely stopped on 02/12/2018.  -M spike on 03/16/2017 shows 0.2 g/dL.  Free light chain ratio is 1.7.  Both of them showed improvement. -Myeloma panel from today is pending.  - We will continue to hold pomalidomide at this time.  He may proceed with every 2-week daratumumab today. -We will see him back in 2 weeks for follow-up and review his myeloma panel.  Then we will decide whether to start him back on pomalidomide because of rash.  He developed minor on and off rashes even on 2 mg every other day dose.  2.  Peripheral neuropathy: -He will continue Neurontin 3 times a day for his numbness in the feet and legs.   3.  Bone protection: -We will continue Zometa every 8 weeks.  He will continue calcium and vitamin D supplements.  4.  Low back pain: -This is from combination of degenerative disc disease and multiple myeloma. -MRI of the lumbar spine on 01/11/2018 showed diffuse lumbar spine spondylosis.  At L3-L4 there is a broad-based disc bulge with left paracentral disc extrusion with inferior migration of the disc material.  Severe bilateral facet arthropathy.  Severe spinal stenosis.  Severe bilateral foraminal stenosis.  15 mm bone lesion in the left sacrum consistent with multiple myeloma is stable.   -He is continue oxycodone 10 mg 2 to 3 tablets/day.  He will also take Motrin as needed.   5.  Sleep: - He has used Restoril and trazodone without help.  He thinks that he already used Ambien also without help. -He reports that he takes half a tablet of Xanax with gabapentin and it helps him to sleep.  He will continue that.      Orders placed this encounter:  No orders of the defined types were  placed in this encounter.     Derek Jack, MD [place] [phone]

## 2018-03-15 NOTE — Assessment & Plan Note (Signed)
1.  IgG kappa multiple myeloma: - RVD from 04/07/2014 through 07/28/2014, maintenance Revlimid which was discontinued 1 and half year ago for unknown reasons.   -MRI in January of the brain showed new right temporal lesion, status post radiation therapy, PET/CT scan in January 2019 showed improvement of multiple myeloma with no new lesions, started on weekly Velcade and dexamethasone on 02/18/2017, status post 2 cycles, Velcade changed to every other week on 05/06/2017 secondary to neuropathy, takes dexamethasone 20 mg on days of Velcade. -Because of persistent back pain, I have done an MRI of the thoracic and lumbar spine.  We discussed the results of the scans today.  He had a new rib lesion close to the spine on the left side, possibly eighth rib.  There is a soft tissue mass in that area.  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  There is also chronic L4 compression fracture which is stable. -DPd started on 09/17/2017.  He developed erythematous maculopapular rash involving the trunk after taking 3 pills of pomalidomide.  It improved after steroids.  (Pomalidomide dose is 2 mg 3 weeks on 1 week off, dexamethasone doses 10 mg weekly). -Status post XRT to the left eighth rib lesion, 30 Gy in 10 fractions from 09/29/2017 through 10/14/2017. - Daratumumab restarted after radiation on 10/28/2017. - M spike on 11/25/2017 was 0.3 g. Myeloma panel from today's pending. - Darzalex was switched to every 2 weeks on 12/16/2017. - Pomalidomide was changed to 2 mg every other day on 01/22/2018. -Pomalidomide was completely stopped on 02/12/2018.  -M spike on 03/16/2017 shows 0.2 g/dL.  Free light chain ratio is 1.7.  Both of them showed improvement. -Myeloma panel from today is pending.  - We will continue to hold pomalidomide at this time.  He may proceed with every 2-week daratumumab today. -We will see him back in 2 weeks for follow-up and review his myeloma panel.  Then we will decide whether to  start him back on pomalidomide because of rash.  He developed minor on and off rashes even on 2 mg every other day dose.  2.  Peripheral neuropathy: -He will continue Neurontin 3 times a day for his numbness in the feet and legs.   3.  Bone protection: -We will continue Zometa every 8 weeks.  He will continue calcium and vitamin D supplements.  4.  Low back pain: -This is from combination of degenerative disc disease and multiple myeloma. -MRI of the lumbar spine on 01/11/2018 showed diffuse lumbar spine spondylosis.  At L3-L4 there is a broad-based disc bulge with left paracentral disc extrusion with inferior migration of the disc material.  Severe bilateral facet arthropathy.  Severe spinal stenosis.  Severe bilateral foraminal stenosis.  15 mm bone lesion in the left sacrum consistent with multiple myeloma is stable.   -He is continue oxycodone 10 mg 2 to 3 tablets/day.  He will also take Motrin as needed.   5.  Sleep: - He has used Restoril and trazodone without help.  He thinks that he already used Ambien also without help. -He reports that he takes half a tablet of Xanax with gabapentin and it helps him to sleep.  He will continue that.

## 2018-03-15 NOTE — Patient Instructions (Signed)
[facility] Discharge Instructions     Thank you for choosing [facility] at [facility] to provide your oncology and hematology care.  To afford each patient quality time with our provider, please arrive at least 15 minutes before your scheduled appointment time.   If you have a lab appointment with the Cancer Center please come in thru the  Main Entrance and check in at the main information desk  You need to re-schedule your appointment should you arrive 10 or more minutes late.  We strive to give you quality time with our providers, and arriving late affects you and other patients whose appointments are after yours.  Also, if you no show three or more times for appointments you may be dismissed from the clinic at the providers discretion.     Again, thank you for choosing [facility].  Our hope is that these requests will decrease the amount of time that you wait before being seen by our physicians.       _____________________________________________________________  Should you have questions after your visit to [facility], please contact our office at [phone] between the hours of 8:00 a.m. and 4:30 p.m.  Voicemails left after 4:00 p.m. will not be returned until the following business day.  For prescription refill requests, have your pharmacy contact our office and allow 72 hours.    Cancer Center Support Programs:   > Cancer Support Group  2nd Tuesday of the month 1pm-2pm, Journey Room    

## 2018-03-16 LAB — PROTEIN ELECTROPHORESIS, SERUM
A/G Ratio: 1.5 (ref 0.7–1.7)
Albumin ELP: 3.2 g/dL (ref 2.9–4.4)
Alpha-1-Globulin: 0.3 g/dL (ref 0.0–0.4)
Alpha-2-Globulin: 0.9 g/dL (ref 0.4–1.0)
Beta Globulin: 0.8 g/dL (ref 0.7–1.3)
Gamma Globulin: 0.2 g/dL — ABNORMAL LOW (ref 0.4–1.8)
Globulin, Total: 2.2 g/dL (ref 2.2–3.9)
M-Spike, %: 0.1 g/dL — ABNORMAL HIGH
Total Protein ELP: 5.4 g/dL — ABNORMAL LOW (ref 6.0–8.5)

## 2018-03-16 LAB — KAPPA/LAMBDA LIGHT CHAINS
Kappa free light chain: 9.5 mg/L (ref 3.3–19.4)
Kappa, lambda light chain ratio: 2.71 — ABNORMAL HIGH (ref 0.26–1.65)
Lambda free light chains: 3.5 mg/L — ABNORMAL LOW (ref 5.7–26.3)

## 2018-03-24 ENCOUNTER — Other Ambulatory Visit (HOSPITAL_COMMUNITY): Payer: Self-pay | Admitting: Hematology

## 2018-03-24 DIAGNOSIS — N400 Enlarged prostate without lower urinary tract symptoms: Secondary | ICD-10-CM

## 2018-03-25 ENCOUNTER — Other Ambulatory Visit: Payer: Self-pay | Admitting: *Deleted

## 2018-03-25 DIAGNOSIS — N400 Enlarged prostate without lower urinary tract symptoms: Secondary | ICD-10-CM

## 2018-03-25 MED ORDER — TAMSULOSIN HCL 0.4 MG PO CAPS: 0.4000 mg | ORAL_CAPSULE | Freq: Every day | ORAL | 3 refills | Status: DC

## 2018-03-26 ENCOUNTER — Ambulatory Visit (HOSPITAL_COMMUNITY): Payer: Medicare Other

## 2018-03-26 ENCOUNTER — Other Ambulatory Visit (HOSPITAL_COMMUNITY): Payer: Medicare Other

## 2018-03-26 ENCOUNTER — Other Ambulatory Visit (HOSPITAL_COMMUNITY): Payer: Self-pay | Admitting: Hematology

## 2018-03-29 ENCOUNTER — Inpatient Hospital Stay (HOSPITAL_COMMUNITY): Payer: Medicare Other

## 2018-03-29 ENCOUNTER — Encounter (HOSPITAL_COMMUNITY): Payer: Self-pay

## 2018-03-29 ENCOUNTER — Other Ambulatory Visit: Payer: Self-pay

## 2018-03-29 VITALS — BP 116/55 | HR 66 | Temp 97.6°F | Resp 18 | Wt 239.2 lb

## 2018-03-29 DIAGNOSIS — C9000 Multiple myeloma not having achieved remission: Secondary | ICD-10-CM

## 2018-03-29 DIAGNOSIS — Z5112 Encounter for antineoplastic immunotherapy: Secondary | ICD-10-CM | POA: Diagnosis not present

## 2018-03-29 LAB — CBC WITH DIFFERENTIAL/PLATELET
Abs Immature Granulocytes: 0.01 10*3/uL (ref 0.00–0.07)
Basophils Absolute: 0 10*3/uL (ref 0.0–0.1)
Basophils Relative: 0 %
Eosinophils Absolute: 0 10*3/uL (ref 0.0–0.5)
Eosinophils Relative: 1 %
HCT: 43.6 % (ref 39.0–52.0)
Hemoglobin: 14.2 g/dL (ref 13.0–17.0)
Immature Granulocytes: 0 %
Lymphocytes Relative: 20 %
Lymphs Abs: 1.3 10*3/uL (ref 0.7–4.0)
MCH: 29.8 pg (ref 26.0–34.0)
MCHC: 32.6 g/dL (ref 30.0–36.0)
MCV: 91.6 fL (ref 80.0–100.0)
Monocytes Absolute: 0.5 10*3/uL (ref 0.1–1.0)
Monocytes Relative: 7 %
Neutro Abs: 4.6 10*3/uL (ref 1.7–7.7)
Neutrophils Relative %: 72 %
Platelets: 134 10*3/uL — ABNORMAL LOW (ref 150–400)
RBC: 4.76 MIL/uL (ref 4.22–5.81)
RDW: 14.3 % (ref 11.5–15.5)
WBC: 6.4 10*3/uL (ref 4.0–10.5)
nRBC: 0 % (ref 0.0–0.2)

## 2018-03-29 LAB — COMPREHENSIVE METABOLIC PANEL
ALT: 13 U/L (ref 0–44)
AST: 15 U/L (ref 15–41)
Albumin: 3.6 g/dL (ref 3.5–5.0)
Alkaline Phosphatase: 22 U/L — ABNORMAL LOW (ref 38–126)
Anion gap: 9 (ref 5–15)
BUN: 19 mg/dL (ref 8–23)
CO2: 21 mmol/L — ABNORMAL LOW (ref 22–32)
Calcium: 8.5 mg/dL — ABNORMAL LOW (ref 8.9–10.3)
Chloride: 110 mmol/L (ref 98–111)
Creatinine, Ser: 0.83 mg/dL (ref 0.61–1.24)
GFR calc Af Amer: >60 mL/min (ref >60)
GFR calc non Af Amer: >60 mL/min (ref >60)
Glucose, Bld: 111 mg/dL — ABNORMAL HIGH (ref 70–99)
Potassium: 4 mmol/L (ref 3.5–5.1)
Sodium: 140 mmol/L (ref 135–145)
Total Bilirubin: 0.5 mg/dL (ref 0.3–1.2)
Total Protein: 5.9 g/dL — ABNORMAL LOW (ref 6.5–8.1)

## 2018-03-29 MED ORDER — SODIUM CHLORIDE 0.9 % IV SOLN: 20.0000 mg | Freq: Once | INTRAVENOUS | Status: DC

## 2018-03-29 MED ORDER — SODIUM CHLORIDE 0.9 % IV SOLN
1800.0000 mg | Freq: Once | INTRAVENOUS | Status: AC
  Administered 2018-03-29: 1800 mg via INTRAVENOUS
  Filled 2018-03-29: qty 80

## 2018-03-29 MED ORDER — SODIUM CHLORIDE 0.9 % IV SOLN
Freq: Once | INTRAVENOUS | Status: AC
  Administered 2018-03-29: 10:00:00 via INTRAVENOUS

## 2018-03-29 MED ORDER — SODIUM CHLORIDE 0.9 % IV SOLN
20.0000 mg | Freq: Once | INTRAVENOUS | Status: AC
  Administered 2018-03-29: 20 mg via INTRAVENOUS
  Filled 2018-03-29: qty 2

## 2018-03-29 MED ORDER — PROCHLORPERAZINE MALEATE 10 MG PO TABS
10.0000 mg | ORAL_TABLET | Freq: Once | ORAL | Status: AC
  Administered 2018-03-29: 10 mg via ORAL

## 2018-03-29 MED ORDER — ACETAMINOPHEN 325 MG PO TABS
ORAL_TABLET | ORAL | Status: AC
  Filled 2018-03-29: qty 2

## 2018-03-29 MED ORDER — ACETAMINOPHEN 325 MG PO TABS
650.0000 mg | ORAL_TABLET | Freq: Once | ORAL | Status: AC
  Administered 2018-03-29: 650 mg via ORAL

## 2018-03-29 MED ORDER — DIPHENHYDRAMINE HCL 25 MG PO CAPS
50.0000 mg | ORAL_CAPSULE | Freq: Once | ORAL | Status: AC
  Administered 2018-03-29: 50 mg via ORAL

## 2018-03-29 MED ORDER — SODIUM CHLORIDE 0.9% FLUSH
10.0000 mL | INTRAVENOUS | Status: DC | PRN
  Administered 2018-03-29: 10 mL
  Filled 2018-03-29: qty 10

## 2018-03-29 MED ORDER — DIPHENHYDRAMINE HCL 25 MG PO CAPS
ORAL_CAPSULE | ORAL | Status: AC
  Filled 2018-03-29: qty 2

## 2018-03-29 MED ORDER — HEPARIN SOD (PORK) LOCK FLUSH 100 UNIT/ML IV SOLN
500.0000 [IU] | Freq: Once | INTRAVENOUS | Status: AC | PRN
  Administered 2018-03-29: 500 [IU]
  Filled 2018-03-29: qty 5

## 2018-03-29 MED ORDER — PROCHLORPERAZINE MALEATE 10 MG PO TABS
ORAL_TABLET | ORAL | Status: AC
  Filled 2018-03-29: qty 1

## 2018-03-29 NOTE — Patient Instructions (Signed)
[facility] Discharge Instructions for Patients Receiving Chemotherapy   Beginning January 23rd 2017 lab work for the [facility] will be done in the  Main lab at [facility] on 1st floor. If you have a lab appointment with the [facility] please come in thru the  Main Entrance and check in at the main information desk   Today you received the following chemotherapy agents Daratumumab. Follow-up as scheduled. Call clinic for any questions or concerns  To help prevent nausea and vomiting after your treatment, we encourage you to take your nausea medication   If you develop nausea and vomiting, or diarrhea that is not controlled by your medication, call the clinic.  The clinic phone number is [phone]. Office hours are Monday-Friday 8:30am-5:00pm.  BELOW ARE SYMPTOMS THAT SHOULD BE REPORTED IMMEDIATELY:  *FEVER GREATER THAN 101.0 F  *CHILLS WITH OR WITHOUT FEVER  NAUSEA AND VOMITING THAT IS NOT CONTROLLED WITH YOUR NAUSEA MEDICATION  *UNUSUAL SHORTNESS OF BREATH  *UNUSUAL BRUISING OR BLEEDING  TENDERNESS IN MOUTH AND THROAT WITH OR WITHOUT PRESENCE OF ULCERS  *URINARY PROBLEMS  *BOWEL PROBLEMS  UNUSUAL RASH Items with * indicate a potential emergency and should be followed up as soon as possible. If you have an emergency after office hours please contact your primary care physician or go to the nearest emergency department.  Please call the clinic during office hours if you have any questions or concerns.   You may also contact the Patient Navigator at [phone] should you have any questions or need assistance in obtaining follow up care.      Resources For Cancer Patients and their Caregivers ? American Cancer Society: Can assist with transportation, wigs, general needs, runs Look Good Feel Better.        984-582-6652 ? Cancer Care: Provides financial assistance, online support groups, medication/co-pay assistance.  1-800-813-HOPE  254 233 4434) ?  Assists [place] cancer patients and their families through emotional , educational and financial support.  (331)648-3524 ? [place] DSS Where to apply for food stamps, Medicaid and utility assistance. (630) 397-5023 ? [facility]: Transportation to medical appointments. 971-233-6548 ? Social Security Administration: May apply for disability if have a Stage IV cancer. 801 444 6019 (820)299-7271 ? [employer], Disability and Transit Services: Assists with nutrition, care and transit needs. ([phone]

## 2018-03-29 NOTE — Progress Notes (Signed)
[name]. tolerated Darzalex infusion well without complaints or incident. Labs reviewed prior to administering this medication. VSS upon discharge. Pt discharged self ambulatory using his cane in satisfactory condition

## 2018-03-30 ENCOUNTER — Other Ambulatory Visit: Payer: Self-pay | Admitting: Family Medicine

## 2018-03-30 DIAGNOSIS — N390 Urinary tract infection, site not specified: Secondary | ICD-10-CM

## 2018-03-30 MED ORDER — SULFAMETHOXAZOLE-TRIMETHOPRIM 800-160 MG PO TABS: 1.0000 | ORAL_TABLET | Freq: Two times a day (BID) | ORAL | 0 refills | Status: DC

## 2018-03-30 NOTE — Progress Notes (Signed)
Pt here with dysuria, has history of UTI, took 2 left over Bactrim Currently being treated by oncology  Will leave UA/culture  Refilled bactrim

## 2018-04-09 ENCOUNTER — Ambulatory Visit (HOSPITAL_COMMUNITY): Payer: Medicare Other

## 2018-04-09 ENCOUNTER — Other Ambulatory Visit (HOSPITAL_COMMUNITY): Payer: Self-pay

## 2018-04-09 ENCOUNTER — Other Ambulatory Visit (HOSPITAL_COMMUNITY): Payer: Medicare Other

## 2018-04-09 DIAGNOSIS — C9000 Multiple myeloma not having achieved remission: Secondary | ICD-10-CM

## 2018-04-12 ENCOUNTER — Encounter (HOSPITAL_COMMUNITY): Payer: Self-pay | Admitting: Hematology

## 2018-04-12 ENCOUNTER — Inpatient Hospital Stay (HOSPITAL_COMMUNITY): Payer: Medicare Other | Attending: Hematology

## 2018-04-12 ENCOUNTER — Inpatient Hospital Stay (HOSPITAL_COMMUNITY): Payer: Medicare Other

## 2018-04-12 ENCOUNTER — Other Ambulatory Visit (HOSPITAL_COMMUNITY): Payer: Medicare Other

## 2018-04-12 ENCOUNTER — Other Ambulatory Visit: Payer: Self-pay

## 2018-04-12 ENCOUNTER — Inpatient Hospital Stay (HOSPITAL_BASED_OUTPATIENT_CLINIC_OR_DEPARTMENT_OTHER): Payer: Medicare Other | Admitting: Hematology

## 2018-04-12 VITALS — BP 125/52 | HR 62 | Temp 98.0°F | Resp 18 | Wt 234.6 lb

## 2018-04-12 DIAGNOSIS — G629 Polyneuropathy, unspecified: Secondary | ICD-10-CM

## 2018-04-12 DIAGNOSIS — M47816 Spondylosis without myelopathy or radiculopathy, lumbar region: Secondary | ICD-10-CM | POA: Diagnosis not present

## 2018-04-12 DIAGNOSIS — C9000 Multiple myeloma not having achieved remission: Secondary | ICD-10-CM | POA: Insufficient documentation

## 2018-04-12 DIAGNOSIS — M545 Low back pain: Secondary | ICD-10-CM | POA: Diagnosis not present

## 2018-04-12 DIAGNOSIS — G4700 Insomnia, unspecified: Secondary | ICD-10-CM

## 2018-04-12 DIAGNOSIS — M48061 Spinal stenosis, lumbar region without neurogenic claudication: Secondary | ICD-10-CM | POA: Diagnosis not present

## 2018-04-12 DIAGNOSIS — Z9221 Personal history of antineoplastic chemotherapy: Secondary | ICD-10-CM | POA: Diagnosis not present

## 2018-04-12 DIAGNOSIS — Z923 Personal history of irradiation: Secondary | ICD-10-CM | POA: Diagnosis not present

## 2018-04-12 DIAGNOSIS — Z5112 Encounter for antineoplastic immunotherapy: Secondary | ICD-10-CM | POA: Insufficient documentation

## 2018-04-12 DIAGNOSIS — M5136 Other intervertebral disc degeneration, lumbar region: Secondary | ICD-10-CM

## 2018-04-12 LAB — COMPREHENSIVE METABOLIC PANEL
ALT: 12 U/L (ref 0–44)
AST: 15 U/L (ref 15–41)
Albumin: 3.5 g/dL (ref 3.5–5.0)
Alkaline Phosphatase: 21 U/L — ABNORMAL LOW (ref 38–126)
Anion gap: 7 (ref 5–15)
BUN: 14 mg/dL (ref 8–23)
CO2: 23 mmol/L (ref 22–32)
Calcium: 8.5 mg/dL — ABNORMAL LOW (ref 8.9–10.3)
Chloride: 110 mmol/L (ref 98–111)
Creatinine, Ser: 0.75 mg/dL (ref 0.61–1.24)
GFR calc Af Amer: >60 mL/min (ref >60)
GFR calc non Af Amer: >60 mL/min (ref >60)
Glucose, Bld: 102 mg/dL — ABNORMAL HIGH (ref 70–99)
Potassium: 3.9 mmol/L (ref 3.5–5.1)
Sodium: 140 mmol/L (ref 135–145)
Total Bilirubin: 0.5 mg/dL (ref 0.3–1.2)
Total Protein: 5.6 g/dL — ABNORMAL LOW (ref 6.5–8.1)

## 2018-04-12 LAB — CBC WITH DIFFERENTIAL/PLATELET
Abs Immature Granulocytes: 0.01 10*3/uL (ref 0.00–0.07)
Basophils Absolute: 0 10*3/uL (ref 0.0–0.1)
Basophils Relative: 0 %
Eosinophils Absolute: 0 10*3/uL (ref 0.0–0.5)
Eosinophils Relative: 0 %
HCT: 42.9 % (ref 39.0–52.0)
Hemoglobin: 13.6 g/dL (ref 13.0–17.0)
Immature Granulocytes: 0 %
Lymphocytes Relative: 20 %
Lymphs Abs: 1 10*3/uL (ref 0.7–4.0)
MCH: 28.9 pg (ref 26.0–34.0)
MCHC: 31.7 g/dL (ref 30.0–36.0)
MCV: 91.1 fL (ref 80.0–100.0)
Monocytes Absolute: 0.4 10*3/uL (ref 0.1–1.0)
Monocytes Relative: 8 %
Neutro Abs: 3.6 10*3/uL (ref 1.7–7.7)
Neutrophils Relative %: 72 %
Platelets: 110 10*3/uL — ABNORMAL LOW (ref 150–400)
RBC: 4.71 MIL/uL (ref 4.22–5.81)
RDW: 14.8 % (ref 11.5–15.5)
WBC: 5.1 10*3/uL (ref 4.0–10.5)
nRBC: 0 % (ref 0.0–0.2)

## 2018-04-12 LAB — LACTATE DEHYDROGENASE: LDH: 134 U/L (ref 98–192)

## 2018-04-12 MED ORDER — SODIUM CHLORIDE 0.9 % IV SOLN
20.0000 mg | Freq: Once | INTRAVENOUS | Status: AC
  Administered 2018-04-12: 20 mg via INTRAVENOUS
  Filled 2018-04-12: qty 2

## 2018-04-12 MED ORDER — DIPHENHYDRAMINE HCL 25 MG PO CAPS
50.0000 mg | ORAL_CAPSULE | Freq: Once | ORAL | Status: AC
  Administered 2018-04-12: 50 mg via ORAL
  Filled 2018-04-12: qty 2

## 2018-04-12 MED ORDER — OXYCODONE HCL 10 MG PO TABS: 10.0000 mg | ORAL_TABLET | Freq: Three times a day (TID) | ORAL | 0 refills | Status: DC | PRN

## 2018-04-12 MED ORDER — SODIUM CHLORIDE 0.9 % IV SOLN
1800.0000 mg | Freq: Once | INTRAVENOUS | Status: AC
  Administered 2018-04-12: 1800 mg via INTRAVENOUS
  Filled 2018-04-12: qty 10

## 2018-04-12 MED ORDER — PROCHLORPERAZINE MALEATE 10 MG PO TABS
10.0000 mg | ORAL_TABLET | Freq: Once | ORAL | Status: AC
  Administered 2018-04-12: 10 mg via ORAL
  Filled 2018-04-12: qty 1

## 2018-04-12 MED ORDER — ALPRAZOLAM 1 MG PO TABS: ORAL_TABLET | ORAL | 0 refills | Status: DC

## 2018-04-12 MED ORDER — GABAPENTIN 400 MG PO CAPS: 400.0000 mg | ORAL_CAPSULE | Freq: Two times a day (BID) | ORAL | 3 refills | Status: DC

## 2018-04-12 MED ORDER — ACYCLOVIR 400 MG PO TABS: 400.0000 mg | ORAL_TABLET | Freq: Two times a day (BID) | ORAL | 3 refills | Status: DC

## 2018-04-12 MED ORDER — SODIUM CHLORIDE 0.9 % IV SOLN
Freq: Once | INTRAVENOUS | Status: AC
  Administered 2018-04-12: 11:00:00 via INTRAVENOUS

## 2018-04-12 MED ORDER — ZOLEDRONIC ACID 4 MG/100ML IV SOLN
4.0000 mg | Freq: Once | INTRAVENOUS | Status: AC
  Administered 2018-04-12: 4 mg via INTRAVENOUS
  Filled 2018-04-12: qty 100

## 2018-04-12 MED ORDER — SODIUM CHLORIDE 0.9% FLUSH
10.0000 mL | INTRAVENOUS | Status: DC | PRN
  Administered 2018-04-12: 10 mL
  Filled 2018-04-12: qty 10

## 2018-04-12 MED ORDER — ACETAMINOPHEN 325 MG PO TABS
650.0000 mg | ORAL_TABLET | Freq: Once | ORAL | Status: AC
  Administered 2018-04-12: 650 mg via ORAL
  Filled 2018-04-12: qty 2

## 2018-04-12 MED ORDER — HEPARIN SOD (PORK) LOCK FLUSH 100 UNIT/ML IV SOLN
500.0000 [IU] | Freq: Once | INTRAVENOUS | Status: AC | PRN
  Administered 2018-04-12: 500 [IU]

## 2018-04-12 NOTE — Progress Notes (Signed)
Dean [name], [place] [address]   CLINIC:  Medical Oncology/Hematology  PCP:  Dean Frizzle, MD [address] [place] [address] [phone]   REASON FOR VISIT: Follow-up for multiple myeloma  CURRENT THERAPY:pomalidomideevery otherday, daratumumab (DARZALEX), and dexamethasoneevery 2 weeks  BRIEF ONCOLOGIC HISTORY:    Multiple myeloma without remission ([place])   11/16/2012 Initial Diagnosis    L occipital condyle destructive lesion, not biopsied secondary to location. XRT    12/01/2012 Imaging    L3 superior endplate compression FX, no lytic lesions to suggest myeloma    02/06/2014 Imaging    soft tissue mass along the right posterior fifth rib with some rib destruction    02/16/2014 Miscellaneous    Normal CBC, Normal CMP, kappa,lamda ratio of 2.62 (abnormal), UIEP with slightly restricted band, IgG kappa, SPEP/IEP with monoclonal protein at 0.79 g/dl, normal igG, suppressed IGM    02/20/2014 Bone Marrow Biopsy    33% kappa restricted plasma cells Normal FISH, normal cytogenetics    02/21/2014 - 03/08/2014 Radiation Therapy    30Gy to R rib lesion, lesion not biopsied    03/16/2014 PET scan    Scattered hypermetabolic osseous lesions, including the calvarium, ribs, left scapula, sacrum, and left femoral shaft. An expansile left vertebral body lesion at L3 extends into the epidural space of the left lateral recess,     03/17/2014 - 04/07/2014 Chemotherapy    Velcade and Dexamethasone due to initial denial of Revlimid by insurance company.  Zometa monthly.    03/22/2014 Imaging    MR_ L-Spine- Enhancing lesions compatible with multiple myeloma at L2, L3, and S1-2.    04/07/2014 - 07/28/2014 Chemotherapy    RVD with Revlimid at 25 mg days 1-14.  Revlimid dose reduced to 25 mg every other day x 14 days with 7 day respite beginning on day 1 of cycle 2.    04/17/2014 Adverse Reaction    Revlimid-induced rash.  Treated with  steroids.    07/28/2014 - 08/04/2014 Chemotherapy    Revlimid daily    08/04/2014 Adverse Reaction    Velcade-induced peripheral neuropathy    08/04/2014 Treatment Plan Change    D/C Velcade    08/11/2014 PET scan    Response to therapy. Improvement and resolution of foci of osseous hypermetabolism.    08/22/2014 - 08/28/2014 Chemotherapy    Revlimid 25 mg days 1-21 every 28 days    08/28/2014 Adverse Reaction    Revlimid-induced rash. Medication held.  Medrol dose Pak prescribed.    09/04/2014 - 01/08/2015 Chemotherapy    Revlimid 10 mg every other day, without dexamethasone    01/08/2015 - 01/15/2015 Hospital Admission    Colonic diverticular abscess with IR drain placement    03/02/2015 PET scan    Only bony uptake is low activity at site of deformity/callus at R second rib FX, high activity in sigmoid colon, advanced sigmoid divertidulosis. Abscess not resolved?    06/04/2015 Imaging    CT nonobstructive L renal calculus, diverticulosis of descending and sigmoid colon without inflammation, moderate prostatic enlargement    07/12/2015 Surgery    Diverticulitis s/p robotic sigmoid colectomy with Dr. Johney Maine    08/23/2015 PET scan    Primarily similar hypermetabolic osseous foci within the R sided ribs. new L third rib focus of hypermetab and sclerosis is favored to be related to healing FX. no soft tissue myeloma id'd. Presumed postop hypermetab and edema about the R pelvic wall  03/06/2016 PET scan    1. Reduced activity in the prior lesion such as the right second and fifth rib lesions, activity nearly completely resolved and significantly less than added mediastinal blood pool activity. No new lesions are identified. 2. Other imaging findings of potential clinical significance: Coronary, aortic arch, and branch vessel atherosclerotic vascular disease. Aortoiliac atherosclerotic vascular disease. Enlarged prostate gland. Colonic diverticula.    02/17/2017 PET scan    HEAD/NECK: No  hypermetabolic activity in the scalp. No hypermetabolic cervical lymph nodes. CHEST: No hypermetabolic mediastinal or hilar nodes. Right upper lobe scarring/atelectasis. No suspicious pulmonary nodules on the CT scan. ABDOMEN/PELVIS: No abnormal hypermetabolic activity within the liver, pancreas, adrenal glands, or spleen. No hypermetabolic lymph nodes in the abdomen or pelvis. Atherosclerotic calcifications of the abdominal aorta and branch vessels. Left renal sinus cysts. Sigmoid diverticulosis, without evidence of diverticulitis. Prostatomegaly. SKELETON: Vague hypermetabolism involving the inferior sternum, max SUV 3.2, previously 8.2. Focal hypermetabolism involving the left sacrum, max SUV 4.0, previously 6.7. EXTREMITIES: No abnormal hypermetabolic activity in the lower extremities.    02/18/2017 -  Chemotherapy    Bortezomib 1.51m/m2 QWk + Dexamethasone 149mQTue/Fri --Cycle #1, 02/18/17     02/18/2017 - 08/26/2017 Chemotherapy    The patient had bortezomib SQ (VELCADE) chemo injection 3 mg, 1.3 mg/m2 = 3 mg, Subcutaneous,  Once, 7 of 9 cycles Administration: 3 mg (02/18/2017), 3 mg (02/26/2017), 3 mg (03/04/2017), 3 mg (03/11/2017), 3 mg (04/08/2017), 3 mg (03/18/2017), 3 mg (03/25/2017), 3 mg (04/01/2017), 3 mg (05/06/2017), 3 mg (05/20/2017), 3 mg (06/03/2017), 3 mg (06/17/2017), 3 mg (07/01/2017), 3 mg (07/15/2017), 3 mg (07/29/2017), 3 mg (08/12/2017), 3 mg (08/26/2017)  for chemotherapy treatment.     09/17/2017 -  Chemotherapy    The patient had daratumumab (DARZALEX) 1,800 mg in sodium chloride 0.9 % 910 mL (1.8 mg/mL) chemo infusion, 15.9 mg/kg = 1,820 mg, Intravenous, Once, 1 of 1 cycle Administration: 1,800 mg (09/17/2017) daratumumab (DARZALEX) 1,800 mg in sodium chloride 0.9 % 410 mL (3.6 mg/mL) chemo infusion, 1,820 mg, Intravenous, Once, 6 of 7 cycles Administration: 1,800 mg (10/28/2017), 1,800 mg (11/04/2017), 1,800 mg (11/11/2017), 1,800 mg (11/18/2017), 1,800 mg (11/25/2017), 1,800 mg (12/02/2017),  1,800 mg (12/09/2017), 1,800 mg (12/16/2017), 1,800 mg (12/30/2017), 1,800 mg (01/13/2018), 1,800 mg (02/12/2018), 1,800 mg (02/26/2018), 1,800 mg (03/15/2018), 1,800 mg (03/29/2018), 1,800 mg (04/12/2018)  for chemotherapy treatment.        INTERVAL HISTORY:  Mr. HeVanvranken66.o. male returns for routine follow-up for multiple myeloma. He is here today with his wife. He reports having more frequent headaches. He is taking BC powders and it is alleviating his pain. He is still fatigued during the day. He is not sleeping well at night even with prescribed medications. Denies any nausea, vomiting, or diarrhea. Denies any new pains. Had not noticed any recent bleeding such as epistaxis, hematuria or hematochezia. Denies recent chest pain on exertion, shortness of breath on minimal exertion, pre-syncopal episodes, or palpitations. Denies any numbness or tingling in hands or feet. Denies any recent fevers, infections, or recent hospitalizations. Patient reports appetite at 25% and energy level at 0%. His appetite is down however he is maintaining his weight.     REVIEW OF SYSTEMS:  Review of Systems  Constitutional: Positive for fatigue.  Neurological: Positive for headaches and numbness.  Psychiatric/Behavioral: Positive for depression and sleep disturbance.  All other systems reviewed and are negative.    PAST MEDICAL/SURGICAL HISTORY:  Past Medical History:  Diagnosis Date  .  BPH (benign prostatic hyperplasia)   . Colonic diverticular abscess 01/08/2015  . Diverticulitis 37/16/96   complicated by abscess and required percutaneous drainage  . GERD (gastroesophageal reflux disease)   . H/O ETOH abuse   . History of chemotherapy last done jan 2017  . History of radiation therapy 01/05/13-02/10/13   45 gray to left occipital condyle region  . History of radiation therapy 12/01/16-12/10/16   Parasternal nodule, chest- 24 Gy total delivered in 8 fractions, Left sacro-iliac, pelvis- 24 Gy total delivered  in 8 fractions   . History of radiation therapy 02/19/17-02/22/17   right temporal scalp 30 Gy in 10 fractions  . Intra-abdominal abscess ([place])   . Multiple myeloma ([place]) 2015  . Neuropathy   . Radiation 02/21/14-03/08/14   right posterior chest wall area 30 gray  . Radiation 04/19/14-05/02/14   lumbar spine 25 gray  . Skull lesion    Left occipital condyle  . Wrist fracture, left    x 2   Past Surgical History:  Procedure Laterality Date  . COLONOSCOPY N/A 04/19/2015   Procedure: COLONOSCOPY;  Surgeon: Danie Binder, MD;  Location: [facility];  Service: Endoscopy;  Laterality: N/A;  1:30 PM  . CYST EXCISION  1959   tail bone  . IR IMAGING GUIDED PORT INSERTION  09/25/2017     SOCIAL HISTORY:  Social History   Socioeconomic History  . Marital status: Married    Spouse name: Not on file  . Number of children: 3  . Years of education: Not on file  . Highest education level: Not on file  Occupational History    Employer: RETIRED  Social Needs  . Financial resource strain: Not on file  . Food insecurity:    Worry: Not on file    Inability: Not on file  . Transportation needs:    Medical: Not on file    Non-medical: Not on file  Tobacco Use  . Smoking status: Never Smoker  . Smokeless tobacco: Never Used  Substance and Sexual Activity  . Alcohol use: No    Comment: " Not much no more"  . Drug use: No  . Sexual activity: Not on file  Lifestyle  . Physical activity:    Days per week: Not on file    Minutes per session: Not on file  . Stress: Not on file  Relationships  . Social connections:    Talks on phone: Not on file    Gets together: Not on file    Attends religious service: Not on file    Active member of club or organization: Not on file    Attends meetings of clubs or organizations: Not on file    Relationship status: Not on file  . Intimate partner violence:    Fear of current or ex partner: Not on file    Emotionally abused: Not on file     Physically abused: Not on file    Forced sexual activity: Not on file  Other Topics Concern  . Not on file  Social History Narrative  . Not on file    FAMILY HISTORY:  Family History  Problem Relation Age of Onset  . Diabetes Mother   . Stroke Mother   . Hypertension Father     CURRENT MEDICATIONS:  Outpatient Encounter Medications as of 04/12/2018  Medication Sig Note  . acyclovir (ZOVIRAX) 400 MG tablet Take 1 tablet (400 mg total) by mouth 2 (two) times daily.   Marland Kitchen ALPRAZolam Duanne Moron) 1  MG tablet Take 2 tablets by mouth at bedtime as needed for anxiety and sleep   . aspirin EC 325 MG tablet Take 1 tablet (325 mg total) by mouth daily.   Marland Kitchen CALCIUM-VITAMIN D PO Take 1 tablet by mouth 2 (two) times daily.    . ciprofloxacin (CIPRO) 500 MG tablet Take 1 tablet (500 mg total) by mouth 2 (two) times daily.   Marland Kitchen dexamethasone (DECADRON) 4 MG tablet Take 2.5 tablets (10 mg total) by mouth once a week. Take 10 mg once weekly 09/18/2017: Started on 09/18/17  . furosemide (LASIX) 20 MG tablet TAKE 1 TABLET BY MOUTH DAILY AS NEEDED FOR EDEMA.   Marland Kitchen gabapentin (NEURONTIN) 400 MG capsule Take 1 capsule (400 mg total) by mouth 2 (two) times daily.   Marland Kitchen lidocaine-prilocaine (EMLA) cream Apply small amount over port one (1) hour prior to appointment.   Marland Kitchen MAGNESIUM PO Take 1 tablet by mouth at bedtime.   Marland Kitchen omeprazole (PRILOSEC) 20 MG capsule Take 1 capsule (20 mg total) by mouth daily.   . Oxycodone HCl 10 MG TABS Take 1 tablet (10 mg total) by mouth every 8 (eight) hours as needed.   . tamsulosin (FLOMAX) 0.4 MG CAPS capsule Take 1 capsule (0.4 mg total) by mouth at bedtime.   . temazepam (RESTORIL) 15 MG capsule TAKE 1 CAPSULE BY MOUTH AT BEDTIME AS NEEDED FOR SLEEP   . zolendronic acid (ZOMETA) 4 MG/5ML injection Inject 4 mg into the vein every 30 (thirty) days.    . [DISCONTINUED] acyclovir (ZOVIRAX) 400 MG tablet Take 1 tablet (400 mg total) by mouth 2 (two) times daily.   . [DISCONTINUED] ALPRAZolam  (XANAX) 1 MG tablet TAKE 2 TABLETS BY MOUTH AT BEDTIME AS NEEDED FOR ANXIETY OR SLEEP   . [DISCONTINUED] gabapentin (NEURONTIN) 400 MG capsule Take 1 capsule (400 mg total) by mouth 2 (two) times daily.   . [DISCONTINUED] Oxycodone HCl 10 MG TABS Take 1 tablet (10 mg total) by mouth every 8 (eight) hours as needed.   . [DISCONTINUED] pomalidomide (POMALYST) 2 MG capsule Take 1 capsule (2 mg total) by mouth every other day. (Patient not taking: Reported on 03/15/2018)   . [DISCONTINUED] sulfamethoxazole-trimethoprim (BACTRIM DS,SEPTRA DS) 800-160 MG tablet Take 1 tablet by mouth 2 (two) times daily. (Patient not taking: Reported on 04/12/2018)   . [DISCONTINUED] traZODone (DESYREL) 100 MG tablet Take 1 tablet (100 mg total) by mouth at bedtime. (Patient not taking: Reported on 04/12/2018)    No facility-administered encounter medications on file as of 04/12/2018.     ALLERGIES:  Allergies  Allergen Reactions  . Codeine Other (See Comments)    Headache  . Morphine And Related Other (See Comments)    Makes him feel weird  . Revlimid [Lenalidomide] Other (See Comments)    "Causes me to become weak"     PHYSICAL EXAM:  ECOG Performance status: 1  VITAL SIGNS:BP:138/71, P:82, R:20, T:97.8, O2: 95% WEIGHT: 234.6   Physical Exam Constitutional:      Appearance: Normal appearance. He is normal weight.  Cardiovascular:     Rate and Rhythm: Normal rate and regular rhythm.     Heart sounds: Normal heart sounds.  Pulmonary:     Effort: Pulmonary effort is normal.     Breath sounds: Normal breath sounds.  Musculoskeletal: Normal range of motion.  Skin:    General: Skin is warm and dry.  Neurological:     Mental Status: He is alert and oriented to person, place,  and time. Mental status is at baseline.  Psychiatric:        Mood and Affect: Mood normal.        Behavior: Behavior normal.        Thought Content: Thought content normal.        Judgment: Judgment normal.      LABORATORY DATA:   I have reviewed the labs as listed.  CBC    Component Value Date/Time   WBC 5.1 04/12/2018 0913   RBC 4.71 04/12/2018 0913   HGB 13.6 04/12/2018 0913   HCT 42.9 04/12/2018 0913   PLT 110 (L) 04/12/2018 0913   MCV 91.1 04/12/2018 0913   MCH 28.9 04/12/2018 0913   MCHC 31.7 04/12/2018 0913   RDW 14.8 04/12/2018 0913   LYMPHSABS 1.0 04/12/2018 0913   MONOABS 0.4 04/12/2018 0913   EOSABS 0.0 04/12/2018 0913   BASOSABS 0.0 04/12/2018 0913   CMP Latest Ref Rng & Units 04/12/2018 03/29/2018 03/15/2018  Glucose 70 - 99 mg/dL 102(H) 111(H) 108(H)  BUN 8 - 23 mg/dL _0 Creatinine 0.61 - 1.24 mg/dL 0.75 0.83 0.80  Sodium 135 - 145 mmol/L 140 140 141  Potassium 3.5 - 5.1 mmol/L 3.9 4.0 4.4  Chloride 98 - 111 mmol/L 110 110 105  CO2 22 - 32 mmol/L 23 21(L) 28  Calcium 8.9 - 10.3 mg/dL 8.5(L) 8.5(L) 9.0  Total Protein 6.5 - 8.1 g/dL 5.6(L) 5.9(L) 5.9(L)  Total Bilirubin 0.3 - 1.2 mg/dL 0.5 0.5 0.3  Alkaline Phos 38 - 126 U/L 21(L) 22(L) 21(L)  AST 15 - 41 U/L 15 15 14(L)  ALT 0 - 44 U/L _1 DIAGNOSTIC IMAGING:  I have independently reviewed the scans and discussed with the patient.   I have reviewed Francene Finders, NP's note and agree with the documentation.  I personally performed a face-to-face visit, made revisions and my assessment and plan is as follows.    ASSESSMENT & PLAN:   Multiple myeloma without remission ([facility]) 1.  IgG kappa multiple myeloma: - RVD from 04/07/2014 through 07/28/2014, maintenance Revlimid which was discontinued 1 and half year ago for unknown reasons.   -MRI in January of the brain showed new right temporal lesion, status post radiation therapy, PET/CT scan in January 2019 showed improvement of multiple myeloma with no new lesions, started on weekly Velcade and dexamethasone on 02/18/2017, status post 2 cycles, Velcade changed to every other week on 05/06/2017 secondary to neuropathy, takes dexamethasone 20 mg on days of Velcade. -Because of  persistent back pain, I have done an MRI of the thoracic and lumbar spine.  We discussed the results of the scans today.  He had a new rib lesion close to the spine on the left side, possibly eighth rib.  There is a soft tissue mass in that area.  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  There is also chronic L4 compression fracture which is stable. -DPd started on 09/17/2017.  He developed erythematous maculopapular rash involving the trunk after taking 3 pills of pomalidomide.  It improved after steroids.  (Pomalidomide dose is 2 mg 3 weeks on 1 week off, dexamethasone doses 10 mg weekly). -Status post XRT to the left eighth rib lesion, 30 Gy in 10 fractions from 09/29/2017 through 10/14/2017. - Daratumumab restarted after radiation on 10/28/2017. - M spike on 11/25/2017 was 0.3 g. Myeloma panel from today's pending. - Darzalex was switched to every 2 weeks on  12/16/2017. - Pomalidomide was changed to 2 mg every other day on 01/22/2018. -Pomalidomide was completely stopped on 02/12/2018.  - We discussed the myeloma panel from 03/15/2018, M spike of 0.1 g/dL which is improvement.  Free light chain ratio shows 2.71. - He will continue daratumumab.  He is taking dexamethasone 10 mg weekly.  He will come back in 4 weeks for follow-up.  2.  Peripheral neuropathy: -He will continue Neurontin 3 times a day for his numbness in the feet and legs.   3.  Bone protection: -He will continue Zometa every 8 weeks.  He will continue calcium and vitamin D supplements.   4.  Low back pain: -This is from combination of degenerative disc disease and multiple myeloma. -MRI of the lumbar spine on 01/11/2018 showed diffuse lumbar spine spondylosis.  At L3-L4 there is a broad-based disc bulge with left paracentral disc extrusion with inferior migration of the disc material.  Severe bilateral facet arthropathy.  Severe spinal stenosis.  Severe bilateral foraminal stenosis.  15 mm bone lesion in the  left sacrum consistent with multiple myeloma is stable.   -He will continue oxycodone 10 mg 2 to 3 tablets/day.   5.  Sleep: - He has used Restoril and trazodone without help.  He thinks that he already used Ambien also without help. -He reports that he takes half a tablet of Xanax with gabapentin and it helps him to sleep.  He will continue that.      Orders placed this encounter:  No orders of the defined types were placed in this encounter.     Derek Jack, MD [place] [phone]

## 2018-04-12 NOTE — Patient Instructions (Addendum)
[place] at [facility]  Discharge Instructions: Follow in 4 weeks with labs.   You saw Dr. Delton Coombes today. _______________________________________________________________  Thank you for choosing [place] at [facility] to provide your oncology and hematology care.  To afford each patient quality time with our providers, please arrive at least 15 minutes before your scheduled appointment.  You need to re-schedule your appointment if you arrive 10 or more minutes late.  We strive to give you quality time with our providers, and arriving late affects you and other patients whose appointments are after yours.  Also, if you no show three or more times for appointments you may be dismissed from the clinic.  Again, thank you for choosing [place] at [place] hope is that these requests will allow you access to exceptional care and in a timely manner. _______________________________________________________________  If you have questions after your visit, please contact our office at [phone] between the hours of 8:30 a.m. and 5:00 p.m. Voicemails left after 4:30 p.m. will not be returned until the following business day. _______________________________________________________________  For prescription refill requests, have your pharmacy contact our office. _______________________________________________________________  Recommendations made by the consultant and any test results will be sent to your referring physician. _______________________________________________________________

## 2018-04-12 NOTE — Assessment & Plan Note (Signed)
1.  IgG kappa multiple myeloma: - RVD from 04/07/2014 through 07/28/2014, maintenance Revlimid which was discontinued 1 and half year ago for unknown reasons.   -MRI in January of the brain showed new right temporal lesion, status post radiation therapy, PET/CT scan in January 2019 showed improvement of multiple myeloma with no new lesions, started on weekly Velcade and dexamethasone on 02/18/2017, status post 2 cycles, Velcade changed to every other week on 05/06/2017 secondary to neuropathy, takes dexamethasone 20 mg on days of Velcade. -Because of persistent back pain, I have done an MRI of the thoracic and lumbar spine.  We discussed the results of the scans today.  He had a new rib lesion close to the spine on the left side, possibly eighth rib.  There is a soft tissue mass in that area.  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  There is also chronic L4 compression fracture which is stable. -DPd started on 09/17/2017.  He developed erythematous maculopapular rash involving the trunk after taking 3 pills of pomalidomide.  It improved after steroids.  (Pomalidomide dose is 2 mg 3 weeks on 1 week off, dexamethasone doses 10 mg weekly). -Status post XRT to the left eighth rib lesion, 30 Gy in 10 fractions from 09/29/2017 through 10/14/2017. - Daratumumab restarted after radiation on 10/28/2017. - M spike on 11/25/2017 was 0.3 g. Myeloma panel from today's pending. - Darzalex was switched to every 2 weeks on 12/16/2017. - Pomalidomide was changed to 2 mg every other day on 01/22/2018. -Pomalidomide was completely stopped on 02/12/2018.  - We discussed the myeloma panel from 03/15/2018, M spike of 0.1 g/dL which is improvement.  Free light chain ratio shows 2.71. - He will continue daratumumab.  He is taking dexamethasone 10 mg weekly.  He will come back in 4 weeks for follow-up.  2.  Peripheral neuropathy: -He will continue Neurontin 3 times a day for his numbness in the feet and legs.    3.  Bone protection: -He will continue Zometa every 8 weeks.  He will continue calcium and vitamin D supplements.   4.  Low back pain: -This is from combination of degenerative disc disease and multiple myeloma. -MRI of the lumbar spine on 01/11/2018 showed diffuse lumbar spine spondylosis.  At L3-L4 there is a broad-based disc bulge with left paracentral disc extrusion with inferior migration of the disc material.  Severe bilateral facet arthropathy.  Severe spinal stenosis.  Severe bilateral foraminal stenosis.  15 mm bone lesion in the left sacrum consistent with multiple myeloma is stable.   -He will continue oxycodone 10 mg 2 to 3 tablets/day.   5.  Sleep: - He has used Restoril and trazodone without help.  He thinks that he already used Ambien also without help. -He reports that he takes half a tablet of Xanax with gabapentin and it helps him to sleep.  He will continue that.

## 2018-04-12 NOTE — Progress Notes (Signed)
1025 Labs reviewed with and pt seen by Dr. Delton Coombes and pt approved for Darzalex and Zometa infusion today per MD.                                   [name]. tolerated Darzalex and Zometa infusions well without complaints or incident.Calcium 8.5 today and pt denied any tooth or jaw pain and no recent or future dental visits prior to administering the Zometa. Pt continues to take his Calcium PO BID without any issues VSS upon discharge. Pt discharged self ambulatory using his cane in satisfactory condition accompanied by his wife

## 2018-04-12 NOTE — Patient Instructions (Signed)
[facility] Discharge Instructions for Patients Receiving Chemotherapy   Beginning January 23rd 2017 lab work for the [facility] will be done in the  Main lab at [facility] on 1st floor. If you have a lab appointment with the [facility] please come in thru the  Main Entrance and check in at the main information desk   Today you received the following chemotherapy agents Darzalex as well as  Zometa infusions. Follow-up as scheduled. Call clinic for any questions or concerns  To help prevent nausea and vomiting after your treatment, we encourage you to take your nausea medication   If you develop nausea and vomiting, or diarrhea that is not controlled by your medication, call the clinic.  The clinic phone number is [phone]. Office hours are Monday-Friday 8:30am-5:00pm.  BELOW ARE SYMPTOMS THAT SHOULD BE REPORTED IMMEDIATELY:  *FEVER GREATER THAN 101.0 F  *CHILLS WITH OR WITHOUT FEVER  NAUSEA AND VOMITING THAT IS NOT CONTROLLED WITH YOUR NAUSEA MEDICATION  *UNUSUAL SHORTNESS OF BREATH  *UNUSUAL BRUISING OR BLEEDING  TENDERNESS IN MOUTH AND THROAT WITH OR WITHOUT PRESENCE OF ULCERS  *URINARY PROBLEMS  *BOWEL PROBLEMS  UNUSUAL RASH Items with * indicate a potential emergency and should be followed up as soon as possible. If you have an emergency after office hours please contact your primary care physician or go to the nearest emergency department.  Please call the clinic during office hours if you have any questions or concerns.   You may also contact the Patient Navigator at [phone] should you have any questions or need assistance in obtaining follow up care.      Resources For Cancer Patients and their Caregivers ? American Cancer Society: Can assist with transportation, wigs, general needs, runs Look Good Feel Better.        6153802701 ? Cancer Care: Provides financial assistance, online support groups, medication/co-pay  assistance.  1-800-813-HOPE 929-864-9722) ? [place] Assists [place] cancer patients and their families through emotional , educational and financial support.  321-255-4256 ? [place] DSS Where to apply for food stamps, Medicaid and utility assistance. 636-263-5497 ? [facility]: Transportation to medical appointments. 858-186-6038 ? Social Security Administration: May apply for disability if have a Stage IV cancer. [phone] 3362948692 ? [employer], Disability and Transit Services: Assists with nutrition, care and transit needs. 720-743-5339

## 2018-04-13 LAB — PROTEIN ELECTROPHORESIS, SERUM
A/G Ratio: 1.6 (ref 0.7–1.7)
Albumin ELP: 3.1 g/dL (ref 2.9–4.4)
Alpha-1-Globulin: 0.2 g/dL (ref 0.0–0.4)
Alpha-2-Globulin: 0.7 g/dL (ref 0.4–1.0)
Beta Globulin: 0.8 g/dL (ref 0.7–1.3)
Gamma Globulin: 0.2 g/dL — ABNORMAL LOW (ref 0.4–1.8)
Globulin, Total: 2 g/dL — ABNORMAL LOW (ref 2.2–3.9)
M-Spike, %: 0.1 g/dL — ABNORMAL HIGH
Total Protein ELP: 5.1 g/dL — ABNORMAL LOW (ref 6.0–8.5)

## 2018-04-13 LAB — KAPPA/LAMBDA LIGHT CHAINS
Kappa free light chain: 9.4 mg/L (ref 3.3–19.4)
Kappa, lambda light chain ratio: 3.03 — ABNORMAL HIGH (ref 0.26–1.65)
Lambda free light chains: 3.1 mg/L — ABNORMAL LOW (ref 5.7–26.3)

## 2018-04-26 ENCOUNTER — Inpatient Hospital Stay (HOSPITAL_COMMUNITY): Payer: Medicare Other

## 2018-04-26 ENCOUNTER — Other Ambulatory Visit: Payer: Self-pay

## 2018-04-26 VITALS — BP 134/57 | HR 60 | Temp 97.9°F | Resp 18 | Wt 233.8 lb

## 2018-04-26 DIAGNOSIS — Z5112 Encounter for antineoplastic immunotherapy: Secondary | ICD-10-CM | POA: Diagnosis not present

## 2018-04-26 DIAGNOSIS — C9000 Multiple myeloma not having achieved remission: Secondary | ICD-10-CM

## 2018-04-26 LAB — COMPREHENSIVE METABOLIC PANEL
ALT: 12 U/L (ref 0–44)
AST: 17 U/L (ref 15–41)
Albumin: 3.8 g/dL (ref 3.5–5.0)
Alkaline Phosphatase: 26 U/L — ABNORMAL LOW (ref 38–126)
Anion gap: 7 (ref 5–15)
BUN: 18 mg/dL (ref 8–23)
CO2: 26 mmol/L (ref 22–32)
Calcium: 8.9 mg/dL (ref 8.9–10.3)
Chloride: 108 mmol/L (ref 98–111)
Creatinine, Ser: 0.8 mg/dL (ref 0.61–1.24)
GFR calc Af Amer: >60 mL/min (ref >60)
GFR calc non Af Amer: >60 mL/min (ref >60)
Glucose, Bld: 108 mg/dL — ABNORMAL HIGH (ref 70–99)
Potassium: 4.8 mmol/L (ref 3.5–5.1)
Sodium: 141 mmol/L (ref 135–145)
Total Bilirubin: 0.6 mg/dL (ref 0.3–1.2)
Total Protein: 6.2 g/dL — ABNORMAL LOW (ref 6.5–8.1)

## 2018-04-26 LAB — CBC WITH DIFFERENTIAL/PLATELET
Abs Immature Granulocytes: 0.01 10*3/uL (ref 0.00–0.07)
Basophils Absolute: 0 10*3/uL (ref 0.0–0.1)
Basophils Relative: 0 %
Eosinophils Absolute: 0 10*3/uL (ref 0.0–0.5)
Eosinophils Relative: 0 %
HCT: 46.1 % (ref 39.0–52.0)
Hemoglobin: 15 g/dL (ref 13.0–17.0)
Immature Granulocytes: 0 %
Lymphocytes Relative: 26 %
Lymphs Abs: 1.1 10*3/uL (ref 0.7–4.0)
MCH: 29.5 pg (ref 26.0–34.0)
MCHC: 32.5 g/dL (ref 30.0–36.0)
MCV: 90.7 fL (ref 80.0–100.0)
Monocytes Absolute: 0.4 10*3/uL (ref 0.1–1.0)
Monocytes Relative: 10 %
Neutro Abs: 2.8 10*3/uL (ref 1.7–7.7)
Neutrophils Relative %: 64 %
Platelets: 116 10*3/uL — ABNORMAL LOW (ref 150–400)
RBC: 5.08 MIL/uL (ref 4.22–5.81)
RDW: 14.5 % (ref 11.5–15.5)
WBC: 4.4 10*3/uL (ref 4.0–10.5)
nRBC: 0 % (ref 0.0–0.2)

## 2018-04-26 MED ORDER — PROCHLORPERAZINE MALEATE 10 MG PO TABS
ORAL_TABLET | ORAL | Status: AC
  Filled 2018-04-26: qty 1

## 2018-04-26 MED ORDER — PROCHLORPERAZINE MALEATE 10 MG PO TABS
10.0000 mg | ORAL_TABLET | Freq: Once | ORAL | Status: AC
  Administered 2018-04-26: 10 mg via ORAL

## 2018-04-26 MED ORDER — ACETAMINOPHEN 325 MG PO TABS
650.0000 mg | ORAL_TABLET | Freq: Once | ORAL | Status: AC
  Administered 2018-04-26: 650 mg via ORAL

## 2018-04-26 MED ORDER — DIPHENHYDRAMINE HCL 25 MG PO CAPS
50.0000 mg | ORAL_CAPSULE | Freq: Once | ORAL | Status: AC
  Administered 2018-04-26: 50 mg via ORAL

## 2018-04-26 MED ORDER — ACETAMINOPHEN 325 MG PO TABS
ORAL_TABLET | ORAL | Status: AC
  Filled 2018-04-26: qty 2

## 2018-04-26 MED ORDER — SODIUM CHLORIDE 0.9 % IV SOLN
20.0000 mg | Freq: Once | INTRAVENOUS | Status: AC
  Administered 2018-04-26: 20 mg via INTRAVENOUS
  Filled 2018-04-26: qty 2

## 2018-04-26 MED ORDER — SODIUM CHLORIDE 0.9 % IV SOLN
15.8000 mg/kg | Freq: Once | INTRAVENOUS | Status: AC
  Administered 2018-04-26: 1800 mg via INTRAVENOUS
  Filled 2018-04-26: qty 10

## 2018-04-26 MED ORDER — SODIUM CHLORIDE 0.9 % IV SOLN
Freq: Once | INTRAVENOUS | Status: AC
  Administered 2018-04-26: 10:00:00 via INTRAVENOUS

## 2018-04-26 MED ORDER — DIPHENHYDRAMINE HCL 25 MG PO CAPS
ORAL_CAPSULE | ORAL | Status: AC
  Filled 2018-04-26: qty 2

## 2018-04-26 NOTE — Progress Notes (Signed)
[place] tolerated Daratumumab without incident or complaint. VSS. Peripheral IV noted for positive blood return by 2 RNs prior to, during, and after infusion complete. Discharged self ambulatory in satisfactory condition.

## 2018-04-26 NOTE — Patient Instructions (Signed)
[facility] Discharge Instructions for Patients Receiving Chemotherapy   Beginning January 23rd 2017 lab work for the Cancer Center will be done in the  Main lab at [place] on 1st floor. If you have a lab appointment with the Cancer Center please come in thru the  Main Entrance and check in at the main information desk   Today you received the following chemotherapy agents Daratumumab  To help prevent nausea and vomiting after your treatment, we encourage you to take your nausea medication   If you develop nausea and vomiting, or diarrhea that is not controlled by your medication, call the clinic.  The clinic phone number is [phone]. Office hours are Monday-Friday 8:30am-5:00pm.  BELOW ARE SYMPTOMS THAT SHOULD BE REPORTED IMMEDIATELY:  *FEVER GREATER THAN 101.0 F  *CHILLS WITH OR WITHOUT FEVER  NAUSEA AND VOMITING THAT IS NOT CONTROLLED WITH YOUR NAUSEA MEDICATION  *UNUSUAL SHORTNESS OF BREATH  *UNUSUAL BRUISING OR BLEEDING  TENDERNESS IN MOUTH AND THROAT WITH OR WITHOUT PRESENCE OF ULCERS  *URINARY PROBLEMS  *BOWEL PROBLEMS  UNUSUAL RASH Items with * indicate a potential emergency and should be followed up as soon as possible. If you have an emergency after office hours please contact your primary care physician or go to the nearest emergency department.  Please call the clinic during office hours if you have any questions or concerns.   You may also contact the Patient Navigator at [phone] should you have any questions or need assistance in obtaining follow up care.      Resources For Cancer Patients and their Caregivers ? American Cancer Society: Can assist with transportation, wigs, general needs, runs Look Good Feel Better.        1-888-227-6333 ? Cancer Care: Provides financial assistance, online support groups, medication/co-pay assistance.  1-800-813-HOPE (4673) ? [facility] Assists [place]  cancer patients and their families through emotional , educational and financial support.  336-427-4357 ? [place] DSS Where to apply for food stamps, Medicaid and utility assistance. 336-342-1394 ? [facility]: Transportation to medical appointments. 336-347-2287 ? Social Security Administration: May apply for disability if have a Stage IV cancer. 336-342-7796 1-800-772-1213 ? [place] [employer], Disability and Transit Services: Assists with nutrition, care and transit needs. 336-349-2343          

## 2018-04-26 NOTE — Progress Notes (Signed)
Labs drawn for peripheral IV.

## 2018-05-10 ENCOUNTER — Inpatient Hospital Stay (HOSPITAL_BASED_OUTPATIENT_CLINIC_OR_DEPARTMENT_OTHER): Payer: Medicare Other | Admitting: Hematology

## 2018-05-10 ENCOUNTER — Inpatient Hospital Stay (HOSPITAL_COMMUNITY): Payer: Medicare Other

## 2018-05-10 ENCOUNTER — Encounter (HOSPITAL_COMMUNITY): Payer: Self-pay | Admitting: Hematology

## 2018-05-10 ENCOUNTER — Inpatient Hospital Stay (HOSPITAL_COMMUNITY): Payer: Medicare Other | Attending: Hematology

## 2018-05-10 ENCOUNTER — Other Ambulatory Visit: Payer: Self-pay

## 2018-05-10 VITALS — BP 121/58 | HR 65 | Temp 97.8°F | Resp 18

## 2018-05-10 DIAGNOSIS — C9000 Multiple myeloma not having achieved remission: Secondary | ICD-10-CM | POA: Diagnosis not present

## 2018-05-10 DIAGNOSIS — M545 Low back pain: Secondary | ICD-10-CM | POA: Diagnosis not present

## 2018-05-10 DIAGNOSIS — Z79899 Other long term (current) drug therapy: Secondary | ICD-10-CM | POA: Diagnosis not present

## 2018-05-10 DIAGNOSIS — G629 Polyneuropathy, unspecified: Secondary | ICD-10-CM | POA: Diagnosis not present

## 2018-05-10 DIAGNOSIS — Z5112 Encounter for antineoplastic immunotherapy: Secondary | ICD-10-CM | POA: Diagnosis not present

## 2018-05-10 LAB — COMPREHENSIVE METABOLIC PANEL
ALT: 15 U/L (ref 0–44)
AST: 16 U/L (ref 15–41)
Albumin: 3.5 g/dL (ref 3.5–5.0)
Alkaline Phosphatase: 23 U/L — ABNORMAL LOW (ref 38–126)
Anion gap: 8 (ref 5–15)
BUN: 18 mg/dL (ref 8–23)
CO2: 26 mmol/L (ref 22–32)
Calcium: 8.6 mg/dL — ABNORMAL LOW (ref 8.9–10.3)
Chloride: 105 mmol/L (ref 98–111)
Creatinine, Ser: 0.73 mg/dL (ref 0.61–1.24)
GFR calc Af Amer: >60 mL/min (ref >60)
GFR calc non Af Amer: >60 mL/min (ref >60)
Glucose, Bld: 102 mg/dL — ABNORMAL HIGH (ref 70–99)
Potassium: 4.3 mmol/L (ref 3.5–5.1)
Sodium: 139 mmol/L (ref 135–145)
Total Bilirubin: 0.5 mg/dL (ref 0.3–1.2)
Total Protein: 5.7 g/dL — ABNORMAL LOW (ref 6.5–8.1)

## 2018-05-10 LAB — CBC WITH DIFFERENTIAL/PLATELET
Abs Immature Granulocytes: 0.01 10*3/uL (ref 0.00–0.07)
Basophils Absolute: 0 10*3/uL (ref 0.0–0.1)
Basophils Relative: 0 %
Eosinophils Absolute: 0 10*3/uL (ref 0.0–0.5)
Eosinophils Relative: 0 %
HCT: 41.6 % (ref 39.0–52.0)
Hemoglobin: 13.7 g/dL (ref 13.0–17.0)
Immature Granulocytes: 0 %
Lymphocytes Relative: 11 %
Lymphs Abs: 0.7 10*3/uL (ref 0.7–4.0)
MCH: 29.9 pg (ref 26.0–34.0)
MCHC: 32.9 g/dL (ref 30.0–36.0)
MCV: 90.8 fL (ref 80.0–100.0)
Monocytes Absolute: 0.4 10*3/uL (ref 0.1–1.0)
Monocytes Relative: 5 %
Neutro Abs: 5.4 10*3/uL (ref 1.7–7.7)
Neutrophils Relative %: 84 %
Platelets: 101 10*3/uL — ABNORMAL LOW (ref 150–400)
RBC: 4.58 MIL/uL (ref 4.22–5.81)
RDW: 14.5 % (ref 11.5–15.5)
WBC: 6.5 10*3/uL (ref 4.0–10.5)
nRBC: 0 % (ref 0.0–0.2)

## 2018-05-10 MED ORDER — ACETAMINOPHEN 325 MG PO TABS
650.0000 mg | ORAL_TABLET | Freq: Once | ORAL | Status: AC
  Administered 2018-05-10: 11:00:00 650 mg via ORAL
  Filled 2018-05-10: qty 2

## 2018-05-10 MED ORDER — SODIUM CHLORIDE 0.9 % IV SOLN
15.8000 mg/kg | Freq: Once | INTRAVENOUS | Status: AC
  Administered 2018-05-10: 13:00:00 1800 mg via INTRAVENOUS
  Filled 2018-05-10: qty 10

## 2018-05-10 MED ORDER — SODIUM CHLORIDE 0.9 % IV SOLN
Freq: Once | INTRAVENOUS | Status: AC
  Administered 2018-05-10: 11:00:00 via INTRAVENOUS

## 2018-05-10 MED ORDER — SODIUM CHLORIDE 0.9 % IV SOLN
20.0000 mg | Freq: Once | INTRAVENOUS | Status: AC
  Administered 2018-05-10: 11:00:00 20 mg via INTRAVENOUS
  Filled 2018-05-10: qty 20

## 2018-05-10 MED ORDER — SODIUM CHLORIDE 0.9% FLUSH
10.0000 mL | INTRAVENOUS | Status: DC | PRN
  Administered 2018-05-10: 10 mL
  Filled 2018-05-10: qty 10

## 2018-05-10 MED ORDER — DIPHENHYDRAMINE HCL 25 MG PO CAPS
50.0000 mg | ORAL_CAPSULE | Freq: Once | ORAL | Status: AC
  Administered 2018-05-10: 50 mg via ORAL
  Filled 2018-05-10: qty 2

## 2018-05-10 MED ORDER — PROCHLORPERAZINE MALEATE 10 MG PO TABS
10.0000 mg | ORAL_TABLET | Freq: Once | ORAL | Status: AC
  Administered 2018-05-10: 10 mg via ORAL
  Filled 2018-05-10: qty 1

## 2018-05-10 MED ORDER — HEPARIN SOD (PORK) LOCK FLUSH 100 UNIT/ML IV SOLN
500.0000 [IU] | Freq: Once | INTRAVENOUS | Status: AC | PRN
  Administered 2018-05-10: 500 [IU]

## 2018-05-10 NOTE — Progress Notes (Signed)
Dean [name], [place] [address]   CLINIC:  Medical Oncology/Hematology  PCP:  [name], MD [address] [place] [address] [phone]   REASON FOR VISIT:  Follow-up for multiple myeloma  CURRENT THERAPY:pomalidomideevery otherday, daratumumab (DARZALEX), and dexamethasoneevery 2 weeks    BRIEF ONCOLOGIC HISTORY:    Multiple myeloma without remission ([facility])   11/16/2012 Initial Diagnosis    L occipital condyle destructive lesion, not biopsied secondary to location. XRT    12/01/2012 Imaging    L3 superior endplate compression FX, no lytic lesions to suggest myeloma    02/06/2014 Imaging    soft tissue mass along the right posterior fifth rib with some rib destruction    02/16/2014 Miscellaneous    Normal CBC, Normal CMP, kappa,lamda ratio of 2.62 (abnormal), UIEP with slightly restricted band, IgG kappa, SPEP/IEP with monoclonal protein at 0.79 g/dl, normal igG, suppressed IGM    02/20/2014 Bone Marrow Biopsy    33% kappa restricted plasma cells Normal FISH, normal cytogenetics    02/21/2014 - 03/08/2014 Radiation Therapy    30Gy to R rib lesion, lesion not biopsied    03/16/2014 PET scan    Scattered hypermetabolic osseous lesions, including the calvarium, ribs, left scapula, sacrum, and left femoral shaft. An expansile left vertebral body lesion at L3 extends into the epidural space of the left lateral recess,     03/17/2014 - 04/07/2014 Chemotherapy    Velcade and Dexamethasone due to initial denial of Revlimid by insurance company.  Zometa monthly.    03/22/2014 Imaging    MR_ L-Spine- Enhancing lesions compatible with multiple myeloma at L2, L3, and S1-2.    04/07/2014 - 07/28/2014 Chemotherapy    RVD with Revlimid at 25 mg days 1-14.  Revlimid dose reduced to 25 mg every other day x 14 days with 7 day respite beginning on day 1 of cycle 2.    04/17/2014 Adverse Reaction    Revlimid-induced rash.  Treated with  steroids.    07/28/2014 - 08/04/2014 Chemotherapy    Revlimid daily    08/04/2014 Adverse Reaction    Velcade-induced peripheral neuropathy    08/04/2014 Treatment Plan Change    D/C Velcade    08/11/2014 PET scan    Response to therapy. Improvement and resolution of foci of osseous hypermetabolism.    08/22/2014 - 08/28/2014 Chemotherapy    Revlimid 25 mg days 1-21 every 28 days    08/28/2014 Adverse Reaction    Revlimid-induced rash. Medication held.  Medrol dose Pak prescribed.    09/04/2014 - 01/08/2015 Chemotherapy    Revlimid 10 mg every other day, without dexamethasone    01/08/2015 - 01/15/2015 Hospital Admission    Colonic diverticular abscess with IR drain placement    03/02/2015 PET scan    Only bony uptake is low activity at site of deformity/callus at R second rib FX, high activity in sigmoid colon, advanced sigmoid divertidulosis. Abscess not resolved?    06/04/2015 Imaging    CT nonobstructive L renal calculus, diverticulosis of descending and sigmoid colon without inflammation, moderate prostatic enlargement    07/12/2015 Surgery    Diverticulitis s/p robotic sigmoid colectomy with Dr. Johney Maine    08/23/2015 PET scan    Primarily similar hypermetabolic osseous foci within the R sided ribs. new L third rib focus of hypermetab and sclerosis is favored to be related to healing FX. no soft tissue myeloma id'd. Presumed postop hypermetab and edema about the R  pelvic wall     03/06/2016 PET scan    1. Reduced activity in the prior lesion such as the right second and fifth rib lesions, activity nearly completely resolved and significantly less than added mediastinal blood pool activity. No new lesions are identified. 2. Other imaging findings of potential clinical significance: Coronary, aortic arch, and branch vessel atherosclerotic vascular disease. Aortoiliac atherosclerotic vascular disease. Enlarged prostate gland. Colonic diverticula.    02/17/2017 PET scan    HEAD/NECK: No  hypermetabolic activity in the scalp. No hypermetabolic cervical lymph nodes. CHEST: No hypermetabolic mediastinal or hilar nodes. Right upper lobe scarring/atelectasis. No suspicious pulmonary nodules on the CT scan. ABDOMEN/PELVIS: No abnormal hypermetabolic activity within the liver, pancreas, adrenal glands, or spleen. No hypermetabolic lymph nodes in the abdomen or pelvis. Atherosclerotic calcifications of the abdominal aorta and branch vessels. Left renal sinus cysts. Sigmoid diverticulosis, without evidence of diverticulitis. Prostatomegaly. SKELETON: Vague hypermetabolism involving the inferior sternum, max SUV 3.2, previously 8.2. Focal hypermetabolism involving the left sacrum, max SUV 4.0, previously 6.7. EXTREMITIES: No abnormal hypermetabolic activity in the lower extremities.    02/18/2017 -  Chemotherapy    Bortezomib 1.27m/m2 QWk + Dexamethasone 165mQTue/Fri --Cycle #1, 02/18/17     02/18/2017 - 08/26/2017 Chemotherapy    The patient had bortezomib SQ (VELCADE) chemo injection 3 mg, 1.3 mg/m2 = 3 mg, Subcutaneous,  Once, 7 of 9 cycles Administration: 3 mg (02/18/2017), 3 mg (02/26/2017), 3 mg (03/04/2017), 3 mg (03/11/2017), 3 mg (04/08/2017), 3 mg (03/18/2017), 3 mg (03/25/2017), 3 mg (04/01/2017), 3 mg (05/06/2017), 3 mg (05/20/2017), 3 mg (06/03/2017), 3 mg (06/17/2017), 3 mg (07/01/2017), 3 mg (07/15/2017), 3 mg (07/29/2017), 3 mg (08/12/2017), 3 mg (08/26/2017)  for chemotherapy treatment.     09/17/2017 -  Chemotherapy    The patient had daratumumab (DARZALEX) 1,800 mg in sodium chloride 0.9 % 910 mL (1.8 mg/mL) chemo infusion, 15.9 mg/kg = 1,820 mg, Intravenous, Once, 1 of 1 cycle Administration: 1,800 mg (09/17/2017) daratumumab (DARZALEX) 1,800 mg in sodium chloride 0.9 % 410 mL (3.6 mg/mL) chemo infusion, 1,820 mg, Intravenous, Once, 8 of 12 cycles Administration: 1,800 mg (10/28/2017), 1,800 mg (11/04/2017), 1,800 mg (11/11/2017), 1,800 mg (11/18/2017), 1,800 mg (11/25/2017), 1,800 mg  (12/02/2017), 1,800 mg (12/09/2017), 1,800 mg (12/16/2017), 1,800 mg (12/30/2017), 1,800 mg (04/26/2018), 1,800 mg (01/13/2018), 1,800 mg (02/12/2018), 1,800 mg (02/26/2018), 1,800 mg (03/15/2018), 1,800 mg (03/29/2018), 1,800 mg (04/12/2018), 1,800 mg (05/10/2018)  for chemotherapy treatment.       CANCER STAGING: Cancer Staging No matching staging information was found for the patient.   INTERVAL HISTORY:  Dean [name].o. male returns for routine follow-up and consideration for next cycle of chemotherapy. Dean [name] is here today alone. Dean [name] states that Dean [name] pain has gotten worse over the last week to week and a half. Dean [name] states that Dean [name] is currently taking medication as prescribed. Denies any nausea, vomiting, or diarrhea. Denies any new pains. Had not noticed any recent bleeding such as epistaxis, hematuria or hematochezia. Denies recent chest pain on exertion, shortness of breath on minimal exertion, pre-syncopal episodes, or palpitations. Denies any numbness or tingling in hands or feet. Denies any recent fevers, infections, or recent hospitalizations. Patient reports appetite at 35% and energy level at 0%.   REVIEW OF SYSTEMS:  Review of Systems  Constitutional: Positive for fatigue.  Cardiovascular: Positive for leg swelling.  Musculoskeletal: Positive for back pain.  Neurological: Positive for dizziness and numbness.     PAST MEDICAL/SURGICAL HISTORY:  Past Medical  History:  Diagnosis Date   BPH (benign prostatic hyperplasia)    Colonic diverticular abscess 01/08/2015   Diverticulitis 09/32/35   complicated by abscess and required percutaneous drainage   GERD (gastroesophageal reflux disease)    H/O ETOH abuse    History of chemotherapy last done jan 2017   History of radiation therapy 01/05/13-02/10/13   45 gray to left occipital condyle region   History of radiation therapy 12/01/16-12/10/16   Parasternal nodule, chest- 24 Gy total delivered in 8 fractions, Left sacro-iliac, pelvis- 24  Gy total delivered in 8 fractions    History of radiation therapy 02/19/17-02/22/17   right temporal scalp 30 Gy in 10 fractions   Intra-abdominal abscess ([facility])    Multiple myeloma ([facility]) 2015   Neuropathy    Radiation 02/21/14-03/08/14   right posterior chest wall area 30 gray   Radiation 04/19/14-05/02/14   lumbar spine 25 gray   Skull lesion    Left occipital condyle   Wrist fracture, left    x 2   Past Surgical History:  Procedure Laterality Date   COLONOSCOPY N/A 04/19/2015   Procedure: COLONOSCOPY;  Surgeon: Danie Binder, MD;  Location: [facility];  Service: Endoscopy;  Laterality: N/A;  1:30 PM   CYST EXCISION  1959   tail bone   IR IMAGING GUIDED PORT INSERTION  09/25/2017     SOCIAL HISTORY:  Social History   Socioeconomic History   Marital status: Married    Spouse name: Not on file   Number of children: 3   Years of education: Not on file   Highest education level: Not on file  Occupational History    Employer: RETIRED  Social Needs   Financial resource strain: Not on file   Food insecurity:    Worry: Not on file    Inability: Not on file   Transportation needs:    Medical: Not on file    Non-medical: Not on file  Tobacco Use   Smoking status: Never Smoker   Smokeless tobacco: Never Used  Substance and Sexual Activity   Alcohol use: No    Comment: " Not much no more"   Drug use: No   Sexual activity: Not on file  Lifestyle   Physical activity:    Days per week: Not on file    Minutes per session: Not on file   Stress: Not on file  Relationships   Social connections:    Talks on phone: Not on file    Gets together: Not on file    Attends religious service: Not on file    Active member of club or organization: Not on file    Attends meetings of clubs or organizations: Not on file    Relationship status: Not on file   Intimate partner violence:    Fear of current or ex partner: Not on file    Emotionally abused: Not on  file    Physically abused: Not on file    Forced sexual activity: Not on file  Other Topics Concern   Not on file  Social History Narrative   Not on file    FAMILY HISTORY:  Family History  Problem Relation Age of Onset   Diabetes Mother    Stroke Mother    Hypertension Father     CURRENT MEDICATIONS:  Outpatient Encounter Medications as of 05/10/2018  Medication Sig Note   acyclovir (ZOVIRAX) 400 MG tablet Take 1 tablet (400 mg total) by mouth 2 (two) times daily.  ALPRAZolam (XANAX) 1 MG tablet Take 2 tablets by mouth at bedtime as needed for anxiety and sleep    aspirin EC 325 MG tablet Take 1 tablet (325 mg total) by mouth daily.    CALCIUM-VITAMIN D PO Take 1 tablet by mouth 2 (two) times daily.     ciprofloxacin (CIPRO) 500 MG tablet Take 1 tablet (500 mg total) by mouth 2 (two) times daily.    dexamethasone (DECADRON) 4 MG tablet Take 2.5 tablets (10 mg total) by mouth once a week. Take 10 mg once weekly 09/18/2017: Started on 09/18/17   furosemide (LASIX) 20 MG tablet TAKE 1 TABLET BY MOUTH DAILY AS NEEDED FOR EDEMA.    gabapentin (NEURONTIN) 400 MG capsule Take 1 capsule (400 mg total) by mouth 2 (two) times daily.    lidocaine-prilocaine (EMLA) cream Apply small amount over port one (1) hour prior to appointment.    MAGNESIUM PO Take 1 tablet by mouth at bedtime.    omeprazole (PRILOSEC) 20 MG capsule Take 1 capsule (20 mg total) by mouth daily.    Oxycodone HCl 10 MG TABS Take 1 tablet (10 mg total) by mouth every 8 (eight) hours as needed.    tamsulosin (FLOMAX) 0.4 MG CAPS capsule Take 1 capsule (0.4 mg total) by mouth at bedtime.    temazepam (RESTORIL) 15 MG capsule TAKE 1 CAPSULE BY MOUTH AT BEDTIME AS NEEDED FOR SLEEP    zolendronic acid (ZOMETA) 4 MG/5ML injection Inject 4 mg into the vein every 30 (thirty) days.     No facility-administered encounter medications on file as of 05/10/2018.     ALLERGIES:  Allergies  Allergen Reactions    Codeine Other (See Comments)    Headache   Morphine And Related Other (See Comments)    Makes him feel weird   Revlimid [Lenalidomide] Other (See Comments)    "Causes me to become weak"     PHYSICAL EXAM:  ECOG Performance status: 1  Vitals:   05/10/18 0927  BP: 119/61  Pulse: 81  Resp: 18  Temp: 98.1 F (36.7 C)  SpO2: 97%   Filed Weights   05/10/18 0927  Weight: 241 lb 9.6 oz (109.6 kg)    Physical Exam Vitals signs reviewed.  Constitutional:      Appearance: Normal appearance.  Cardiovascular:     Rate and Rhythm: Normal rate and regular rhythm.     Heart sounds: Normal heart sounds.  Pulmonary:     Effort: Pulmonary effort is normal.     Breath sounds: Normal breath sounds.  Abdominal:     General: Bowel sounds are normal. There is no distension.     Palpations: Abdomen is soft.     Tenderness: There is no abdominal tenderness.  Musculoskeletal:        General: No swelling.  Skin:    General: Skin is warm.  Neurological:     General: No focal deficit present.     Mental Status: Dean [name] is alert and oriented to person, place, and time.  Psychiatric:        Mood and Affect: Mood normal.        Behavior: Behavior normal.      LABORATORY DATA:  I have reviewed the labs as listed.  CBC    Component Value Date/Time   WBC 6.5 05/10/2018 0930   RBC 4.58 05/10/2018 0930   HGB 13.7 05/10/2018 0930   HCT 41.6 05/10/2018 0930   PLT 101 (L) 05/10/2018 0930   MCV 90.8  05/10/2018 0930   MCH 29.9 05/10/2018 0930   MCHC 32.9 05/10/2018 0930   RDW 14.5 05/10/2018 0930   LYMPHSABS 0.7 05/10/2018 0930   MONOABS 0.4 05/10/2018 0930   EOSABS 0.0 05/10/2018 0930   BASOSABS 0.0 05/10/2018 0930   CMP Latest Ref Rng & Units 05/10/2018 04/26/2018 04/12/2018  Glucose 70 - 99 mg/dL 102(H) 108(H) 102(H)  BUN 8 - 23 mg/dL 18 18 14   Creatinine 0.61 - 1.24 mg/dL 0.73 0.80 0.75  Sodium 135 - 145 mmol/L 139 141 140  Potassium 3.5 - 5.1 mmol/L 4.3 4.8 3.9  Chloride 98 - 111  mmol/L 105 108 110  CO2 22 - 32 mmol/L 26 26 23   Calcium 8.9 - 10.3 mg/dL 8.6(L) 8.9 8.5(L)  Total Protein 6.5 - 8.1 g/dL 5.7(L) 6.2(L) 5.6(L)  Total Bilirubin 0.3 - 1.2 mg/dL 0.5 0.6 0.5  Alkaline Phos 38 - 126 U/L 23(L) 26(L) 21(L)  AST 15 - 41 U/L 16 17 15   ALT 0 - 44 U/L 15 12 12        DIAGNOSTIC IMAGING:  I have independently reviewed the scans and discussed with the patient.   I have reviewed Venita Lick LPN's note and agree with the documentation.  I personally performed a face-to-face visit, made revisions and my assessment and plan is as follows.    ASSESSMENT & PLAN:   Multiple myeloma without remission ([facility]) 1.  IgG kappa multiple myeloma: - RVD from 04/07/2014 through 07/28/2014, maintenance Revlimid which was discontinued 1 and half year ago for unknown reasons.   -MRI in January of the brain showed new right temporal lesion, status post radiation therapy, PET/CT scan in January 2019 showed improvement of multiple myeloma with no new lesions, started on weekly Velcade and dexamethasone on 02/18/2017, status post 2 cycles, Velcade changed to every other week on 05/06/2017 secondary to neuropathy, takes dexamethasone 20 mg on days of Velcade. -Because of persistent back pain, I have done an MRI of the thoracic and lumbar spine.  We discussed the results of the scans today.  Dean [name] had a new rib lesion close to the spine on the left side, possibly eighth rib.  There is a soft tissue mass in that area.  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  There is also chronic L4 compression fracture which is stable. -DPd started on 09/17/2017.  Dean [name] developed erythematous maculopapular rash involving the trunk after taking 3 pills of pomalidomide.  It improved after steroids.  (Pomalidomide dose is 2 mg 3 weeks on 1 week off, dexamethasone doses 10 mg weekly). -Status post XRT to the left eighth rib lesion, 30 Gy in 10 fractions from 09/29/2017 through 10/14/2017. -  Daratumumab restarted after radiation on 10/28/2017. - Pomalidomide was changed to 2 mg every other day on 01/22/2018. -Pomalidomide was completely stopped on 02/12/2018.  - Myeloma panel from 04/12/2018 shows M spike of 0.1 g/dL.  Calcium is 8.7.  Free light chain ratio was 3.03, up from 2.71. - Dean [name] will continue daratumumab along with dexamethasone 10 mg weekly.  Dean [name] will continue acyclovir twice daily. -I will reevaluate him in 4 weeks.  2.  Peripheral neuropathy: -We will continue Neurontin 3 times a day for his numbness in the feet and legs.  3.  Bone protection: -We will continue Zometa every 8 weeks.  Dean [name] will continue calcium and vitamin D supplements.   4.  Low back pain: -This is from combination of degenerative disc disease and multiple myeloma. -MRI of the lumbar  spine on 01/11/2018 showed diffuse lumbar spine spondylosis.  At L3-L4 there is a broad-based disc bulge with left paracentral disc extrusion with inferior migration of the disc material.  Severe bilateral facet arthropathy.  Severe spinal stenosis.  Severe bilateral foraminal stenosis.  15 mm bone lesion in the left sacrum consistent with multiple myeloma is stable.   -Dean [name] will continue oxycodone 10 mg 2 to 3 tablets/day.  5.  Sleep: - Dean [name] has used Restoril and trazodone without help.  Dean [name] thinks that Dean [name] already used Ambien also without help. -Dean [name] reports that Dean [name] takes half a tablet of Xanax with gabapentin and it helps him to sleep.  Dean [name] will continue that.  Total time spent is 25 minutes with more than 50% of the time spent face-to-face discussing and reinforcing treatment plan and coordination of care.    Orders placed this encounter:  No orders of the defined types were placed in this encounter.     Derek Jack, MD [facility] [phone]

## 2018-05-10 NOTE — Patient Instructions (Signed)
[facility] Discharge Instructions for Patients Receiving Chemotherapy   Beginning January 23rd 2017 lab work for the Cancer Center will be done in the  Main lab at [facility] on 1st floor. If you have a lab appointment with the Cancer Center please come in thru the  Main Entrance and check in at the main information desk   Today you received the following chemotherapy agents Darzalex. Follow-up as scheduled. Call clinic for any questions or concerns  To help prevent nausea and vomiting after your treatment, we encourage you to take your nausea medication   If you develop nausea and vomiting, or diarrhea that is not controlled by your medication, call the clinic.  The clinic phone number is [phone]. Office hours are Monday-Friday 8:30am-5:00pm.  BELOW ARE SYMPTOMS THAT SHOULD BE REPORTED IMMEDIATELY:  *FEVER GREATER THAN 101.0 F  *CHILLS WITH OR WITHOUT FEVER  NAUSEA AND VOMITING THAT IS NOT CONTROLLED WITH YOUR NAUSEA MEDICATION  *UNUSUAL SHORTNESS OF BREATH  *UNUSUAL BRUISING OR BLEEDING  TENDERNESS IN MOUTH AND THROAT WITH OR WITHOUT PRESENCE OF ULCERS  *URINARY PROBLEMS  *BOWEL PROBLEMS  UNUSUAL RASH Items with * indicate a potential emergency and should be followed up as soon as possible. If you have an emergency after office hours please contact your primary care physician or go to the nearest emergency department.  Please call the clinic during office hours if you have any questions or concerns.   You may also contact the Patient Navigator at [phone] should you have any questions or need assistance in obtaining follow up care.      Resources For Cancer Patients and their Caregivers ? American Cancer Society: Can assist with transportation, wigs, general needs, runs Look Good Feel Better.        1-888-227-6333 ? Cancer Care: Provides financial assistance, online support groups, medication/co-pay assistance.  1-800-813-HOPE  (4673) ? [facility] Assists [place] cancer patients and their families through emotional , educational and financial support.  336-427-4357 ? [place] DSS Where to apply for food stamps, Medicaid and utility assistance. 336-342-1394 ? [facility]: Transportation to medical appointments. 336-347-2287 ? Social Security Administration: May apply for disability if have a Stage IV cancer. 336-342-7796 1-800-772-1213 ? [place] Aging, Disability and Transit Services: Assists with nutrition, care and transit needs. 336-349-2343         

## 2018-05-10 NOTE — Assessment & Plan Note (Signed)
1.  IgG kappa multiple myeloma: - RVD from 04/07/2014 through 07/28/2014, maintenance Revlimid which was discontinued 1 and half year ago for unknown reasons.   -MRI in January of the brain showed new right temporal lesion, status post radiation therapy, PET/CT scan in January 2019 showed improvement of multiple myeloma with no new lesions, started on weekly Velcade and dexamethasone on 02/18/2017, status post 2 cycles, Velcade changed to every other week on 05/06/2017 secondary to neuropathy, takes dexamethasone 20 mg on days of Velcade. -Because of persistent back pain, I have done an MRI of the thoracic and lumbar spine.  We discussed the results of the scans today.  He had a new rib lesion close to the spine on the left side, possibly eighth rib.  There is a soft tissue mass in that area.  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  There is also chronic L4 compression fracture which is stable. -DPd started on 09/17/2017.  He developed erythematous maculopapular rash involving the trunk after taking 3 pills of pomalidomide.  It improved after steroids.  (Pomalidomide dose is 2 mg 3 weeks on 1 week off, dexamethasone doses 10 mg weekly). -Status post XRT to the left eighth rib lesion, 30 Gy in 10 fractions from 09/29/2017 through 10/14/2017. - Daratumumab restarted after radiation on 10/28/2017. - Pomalidomide was changed to 2 mg every other day on 01/22/2018. -Pomalidomide was completely stopped on 02/12/2018.  - Myeloma panel from 04/12/2018 shows M spike of 0.1 g/dL.  Calcium is 8.7.  Free light chain ratio was 3.03, up from 2.71. - He will continue daratumumab along with dexamethasone 10 mg weekly.  He will continue acyclovir twice daily. -I will reevaluate him in 4 weeks.  2.  Peripheral neuropathy: -We will continue Neurontin 3 times a day for his numbness in the feet and legs.  3.  Bone protection: -We will continue Zometa every 8 weeks.  He will continue calcium and vitamin D  supplements.   4.  Low back pain: -This is from combination of degenerative disc disease and multiple myeloma. -MRI of the lumbar spine on 01/11/2018 showed diffuse lumbar spine spondylosis.  At L3-L4 there is a broad-based disc bulge with left paracentral disc extrusion with inferior migration of the disc material.  Severe bilateral facet arthropathy.  Severe spinal stenosis.  Severe bilateral foraminal stenosis.  15 mm bone lesion in the left sacrum consistent with multiple myeloma is stable.   -He will continue oxycodone 10 mg 2 to 3 tablets/day.  5.  Sleep: - He has used Restoril and trazodone without help.  He thinks that he already used Ambien also without help. -He reports that he takes half a tablet of Xanax with gabapentin and it helps him to sleep.  He will continue that.

## 2018-05-10 NOTE — Patient Instructions (Signed)
[facility] at [facility] Discharge Instructions  Labs drawn from portacath today   Thank you for choosing [facility] at [facility] to provide your oncology and hematology care.  To afford each patient quality time with our provider, please arrive at least 15 minutes before your scheduled appointment time.   If you have a lab appointment with the Cancer Center please come in thru the  Main Entrance and check in at the main information desk  You need to re-schedule your appointment should you arrive 10 or more minutes late.  We strive to give you quality time with our providers, and arriving late affects you and other patients whose appointments are after yours.  Also, if you no show three or more times for appointments you may be dismissed from the clinic at the providers discretion.     Again, thank you for choosing [facility].  Our hope is that these requests will decrease the amount of time that you wait before being seen by our physicians.       _____________________________________________________________  Should you have questions after your visit to [facility], please contact our office at [phone] between the hours of 8:00 a.m. and 4:30 p.m.  Voicemails left after 4:00 p.m. will not be returned until the following business day.  For prescription refill requests, have your pharmacy contact our office and allow 72 hours.    Cancer Center Support Programs:   > Cancer Support Group  2nd Tuesday of the month 1pm-2pm, Journey Room   

## 2018-05-10 NOTE — Progress Notes (Signed)
1030 CBCD and CMET results reviewed with and pt seen by Dr. Delton Coombes and pt approved for Darzalex infusion today then to continue treatments monthly per MD                                                                                     [name]. tolerated Darzalex infusion well without complaints or incident. VSS upon discharge. Pt discharged self ambulatory using his cane in satisfactory condition

## 2018-05-10 NOTE — Patient Instructions (Signed)
[facility] at [facility] Discharge Instructions  You were seen today by Dr. Katragadda. He went over your recent lab results. He will see you back in 4 weeks for labs, treatment and follow up.   Thank you for choosing [facility] at [facility] to provide your oncology and hematology care.  To afford each patient quality time with our provider, please arrive at least 15 minutes before your scheduled appointment time.   If you have a lab appointment with the Cancer Center please come in thru the  Main Entrance and check in at the main information desk  You need to re-schedule your appointment should you arrive 10 or more minutes late.  We strive to give you quality time with our providers, and arriving late affects you and other patients whose appointments are after yours.  Also, if you no show three or more times for appointments you may be dismissed from the clinic at the providers discretion.     Again, thank you for choosing [facility].  Our hope is that these requests will decrease the amount of time that you wait before being seen by our physicians.       _____________________________________________________________  Should you have questions after your visit to [facility], please contact our office at [phone] between the hours of 8:00 a.m. and 4:30 p.m.  Voicemails left after 4:00 p.m. will not be returned until the following business day.  For prescription refill requests, have your pharmacy contact our office and allow 72 hours.    Cancer Center Support Programs:   > Cancer Support Group  2nd Tuesday of the month 1pm-2pm, Journey Room    

## 2018-05-12 ENCOUNTER — Other Ambulatory Visit (HOSPITAL_COMMUNITY): Payer: Self-pay | Admitting: Nurse Practitioner

## 2018-05-12 DIAGNOSIS — K219 Gastro-esophageal reflux disease without esophagitis: Secondary | ICD-10-CM

## 2018-06-07 ENCOUNTER — Inpatient Hospital Stay (HOSPITAL_COMMUNITY): Payer: Medicare Other

## 2018-06-07 ENCOUNTER — Inpatient Hospital Stay (HOSPITAL_COMMUNITY): Payer: Medicare Other | Attending: Hematology

## 2018-06-07 ENCOUNTER — Other Ambulatory Visit: Payer: Self-pay

## 2018-06-07 ENCOUNTER — Encounter (HOSPITAL_COMMUNITY): Payer: Self-pay | Admitting: Hematology

## 2018-06-07 ENCOUNTER — Inpatient Hospital Stay (HOSPITAL_COMMUNITY): Payer: Medicare Other | Attending: Hematology | Admitting: Hematology

## 2018-06-07 ENCOUNTER — Encounter (HOSPITAL_COMMUNITY): Payer: Self-pay

## 2018-06-07 VITALS — BP 141/62 | HR 60 | Temp 97.6°F | Resp 18

## 2018-06-07 DIAGNOSIS — C9000 Multiple myeloma not having achieved remission: Secondary | ICD-10-CM | POA: Diagnosis not present

## 2018-06-07 DIAGNOSIS — Z5112 Encounter for antineoplastic immunotherapy: Secondary | ICD-10-CM | POA: Diagnosis not present

## 2018-06-07 DIAGNOSIS — M545 Low back pain: Secondary | ICD-10-CM | POA: Diagnosis not present

## 2018-06-07 DIAGNOSIS — G479 Sleep disorder, unspecified: Secondary | ICD-10-CM

## 2018-06-07 DIAGNOSIS — G629 Polyneuropathy, unspecified: Secondary | ICD-10-CM | POA: Insufficient documentation

## 2018-06-07 LAB — COMPREHENSIVE METABOLIC PANEL
ALT: 11 U/L (ref 0–44)
AST: 14 U/L — ABNORMAL LOW (ref 15–41)
Albumin: 3.4 g/dL — ABNORMAL LOW (ref 3.5–5.0)
Alkaline Phosphatase: 23 U/L — ABNORMAL LOW (ref 38–126)
Anion gap: 9 (ref 5–15)
BUN: 21 mg/dL (ref 8–23)
CO2: 24 mmol/L (ref 22–32)
Calcium: 8.6 mg/dL — ABNORMAL LOW (ref 8.9–10.3)
Chloride: 108 mmol/L (ref 98–111)
Creatinine, Ser: 0.8 mg/dL (ref 0.61–1.24)
GFR calc Af Amer: >60 mL/min (ref >60)
GFR calc non Af Amer: >60 mL/min (ref >60)
Glucose, Bld: 109 mg/dL — ABNORMAL HIGH (ref 70–99)
Potassium: 4.1 mmol/L (ref 3.5–5.1)
Sodium: 141 mmol/L (ref 135–145)
Total Bilirubin: 0.5 mg/dL (ref 0.3–1.2)
Total Protein: 5.7 g/dL — ABNORMAL LOW (ref 6.5–8.1)

## 2018-06-07 LAB — CBC WITH DIFFERENTIAL/PLATELET
Abs Immature Granulocytes: 0.01 10*3/uL (ref 0.00–0.07)
Basophils Absolute: 0 10*3/uL (ref 0.0–0.1)
Basophils Relative: 0 %
Eosinophils Absolute: 0 10*3/uL (ref 0.0–0.5)
Eosinophils Relative: 0 %
HCT: 41.1 % (ref 39.0–52.0)
Hemoglobin: 13.3 g/dL (ref 13.0–17.0)
Immature Granulocytes: 0 %
Lymphocytes Relative: 14 %
Lymphs Abs: 0.7 10*3/uL (ref 0.7–4.0)
MCH: 29.4 pg (ref 26.0–34.0)
MCHC: 32.4 g/dL (ref 30.0–36.0)
MCV: 90.7 fL (ref 80.0–100.0)
Monocytes Absolute: 0.4 10*3/uL (ref 0.1–1.0)
Monocytes Relative: 7 %
Neutro Abs: 4 10*3/uL (ref 1.7–7.7)
Neutrophils Relative %: 79 %
Platelets: 119 10*3/uL — ABNORMAL LOW (ref 150–400)
RBC: 4.53 MIL/uL (ref 4.22–5.81)
RDW: 14.2 % (ref 11.5–15.5)
WBC: 5.1 10*3/uL (ref 4.0–10.5)
nRBC: 0 % (ref 0.0–0.2)

## 2018-06-07 MED ORDER — ZOLEDRONIC ACID 4 MG/100ML IV SOLN
4.0000 mg | Freq: Once | INTRAVENOUS | Status: AC
  Administered 2018-06-07: 4 mg via INTRAVENOUS
  Filled 2018-06-07: qty 100

## 2018-06-07 MED ORDER — SODIUM CHLORIDE 0.9% FLUSH
10.0000 mL | INTRAVENOUS | Status: DC | PRN
  Administered 2018-06-07: 10 mL
  Filled 2018-06-07: qty 10

## 2018-06-07 MED ORDER — HEPARIN SOD (PORK) LOCK FLUSH 100 UNIT/ML IV SOLN
500.0000 [IU] | Freq: Once | INTRAVENOUS | Status: AC | PRN
  Administered 2018-06-07: 500 [IU]

## 2018-06-07 MED ORDER — DIPHENHYDRAMINE HCL 25 MG PO CAPS
50.0000 mg | ORAL_CAPSULE | Freq: Once | ORAL | Status: AC
  Administered 2018-06-07: 50 mg via ORAL
  Filled 2018-06-07: qty 2

## 2018-06-07 MED ORDER — SODIUM CHLORIDE 0.9 % IV SOLN
Freq: Once | INTRAVENOUS | Status: AC
  Administered 2018-06-07: 11:00:00 via INTRAVENOUS

## 2018-06-07 MED ORDER — SODIUM CHLORIDE 0.9 % IV SOLN
20.0000 mg | Freq: Once | INTRAVENOUS | Status: AC
  Administered 2018-06-07: 20 mg via INTRAVENOUS
  Filled 2018-06-07: qty 20

## 2018-06-07 MED ORDER — ACETAMINOPHEN 325 MG PO TABS
650.0000 mg | ORAL_TABLET | Freq: Once | ORAL | Status: AC
  Administered 2018-06-07: 650 mg via ORAL
  Filled 2018-06-07: qty 2

## 2018-06-07 MED ORDER — PROCHLORPERAZINE MALEATE 10 MG PO TABS
10.0000 mg | ORAL_TABLET | Freq: Once | ORAL | Status: AC
  Administered 2018-06-07: 10 mg via ORAL
  Filled 2018-06-07: qty 1

## 2018-06-07 MED ORDER — SODIUM CHLORIDE 0.9 % IV SOLN
15.8000 mg/kg | Freq: Once | INTRAVENOUS | Status: AC
  Administered 2018-06-07: 1800 mg via INTRAVENOUS
  Filled 2018-06-07: qty 80

## 2018-06-07 NOTE — Progress Notes (Signed)
Labs reviewed with MD today. Will proceed with treatment today per MD.   Treatment given per orders. Patient tolerated it well without problems. Vitals stable and discharged home from clinic ambulatory. Follow up as scheduled.  

## 2018-06-07 NOTE — Progress Notes (Signed)
[place], [place] [address]   CLINIC:  Medical Oncology/Hematology  PCP:  [name], MD [address] [place] [address] [phone]   REASON FOR VISIT:  Follow-up for IgG kappa multiple myeloma  CURRENT THERAPY: Daratumumab/Dexamthasone  BRIEF ONCOLOGIC HISTORY:    Multiple myeloma without remission ([place])   11/16/2012 Initial Diagnosis    L occipital condyle destructive lesion, not biopsied secondary to location. XRT    12/01/2012 Imaging    L3 superior endplate compression FX, no lytic lesions to suggest myeloma    02/06/2014 Imaging    soft tissue mass along the right posterior fifth rib with some rib destruction    02/16/2014 Miscellaneous    Normal CBC, Normal CMP, kappa,lamda ratio of 2.62 (abnormal), UIEP with slightly restricted band, IgG kappa, SPEP/IEP with monoclonal protein at 0.79 g/dl, normal igG, suppressed IGM    02/20/2014 Bone Marrow Biopsy    33% kappa restricted plasma cells Normal FISH, normal cytogenetics    02/21/2014 - 03/08/2014 Radiation Therapy    30Gy to R rib lesion, lesion not biopsied    03/16/2014 PET scan    Scattered hypermetabolic osseous lesions, including the calvarium, ribs, left scapula, sacrum, and left femoral shaft. An expansile left vertebral body lesion at L3 extends into the epidural space of the left lateral recess,     03/17/2014 - 04/07/2014 Chemotherapy    Velcade and Dexamethasone due to initial denial of Revlimid by insurance company.  Zometa monthly.    03/22/2014 Imaging    MR_ L-Spine- Enhancing lesions compatible with multiple myeloma at L2, L3, and S1-2.    04/07/2014 - 07/28/2014 Chemotherapy    RVD with Revlimid at 25 mg days 1-14.  Revlimid dose reduced to 25 mg every other day x 14 days with 7 day respite beginning on day 1 of cycle 2.    04/17/2014 Adverse Reaction    Revlimid-induced rash.  Treated with steroids.    07/28/2014 - 08/04/2014 Chemotherapy   Revlimid daily    08/04/2014 Adverse Reaction    Velcade-induced peripheral neuropathy    08/04/2014 Treatment Plan Change    D/C Velcade    08/11/2014 PET scan    Response to therapy. Improvement and resolution of foci of osseous hypermetabolism.    08/22/2014 - 08/28/2014 Chemotherapy    Revlimid 25 mg days 1-21 every 28 days    08/28/2014 Adverse Reaction    Revlimid-induced rash. Medication held.  Medrol dose Pak prescribed.    09/04/2014 - 01/08/2015 Chemotherapy    Revlimid 10 mg every other day, without dexamethasone    01/08/2015 - 01/15/2015 Hospital Admission    Colonic diverticular abscess with IR drain placement    03/02/2015 PET scan    Only bony uptake is low activity at site of deformity/callus at R second rib FX, high activity in sigmoid colon, advanced sigmoid divertidulosis. Abscess not resolved?    06/04/2015 Imaging    CT nonobstructive L renal calculus, diverticulosis of descending and sigmoid colon without inflammation, moderate prostatic enlargement    07/12/2015 Surgery    Diverticulitis s/p robotic sigmoid colectomy with Dr. Johney Maine    08/23/2015 PET scan    Primarily similar hypermetabolic osseous foci within the R sided ribs. new L third rib focus of hypermetab and sclerosis is favored to be related to healing FX. no soft tissue myeloma id'd. Presumed postop hypermetab and edema about the R pelvic wall     03/06/2016 PET  scan    1. Reduced activity in the prior lesion such as the right second and fifth rib lesions, activity nearly completely resolved and significantly less than added mediastinal blood pool activity. No new lesions are identified. 2. Other imaging findings of potential clinical significance: Coronary, aortic arch, and branch vessel atherosclerotic vascular disease. Aortoiliac atherosclerotic vascular disease. Enlarged prostate gland. Colonic diverticula.    02/17/2017 PET scan    HEAD/NECK: No hypermetabolic activity in the scalp. No hypermetabolic  cervical lymph nodes. CHEST: No hypermetabolic mediastinal or hilar nodes. Right upper lobe scarring/atelectasis. No suspicious pulmonary nodules on the CT scan. ABDOMEN/PELVIS: No abnormal hypermetabolic activity within the liver, pancreas, adrenal glands, or spleen. No hypermetabolic lymph nodes in the abdomen or pelvis. Atherosclerotic calcifications of the abdominal aorta and branch vessels. Left renal sinus cysts. Sigmoid diverticulosis, without evidence of diverticulitis. Prostatomegaly. SKELETON: Vague hypermetabolism involving the inferior sternum, max SUV 3.2, previously 8.2. Focal hypermetabolism involving the left sacrum, max SUV 4.0, previously 6.7. EXTREMITIES: No abnormal hypermetabolic activity in the lower extremities.    02/18/2017 -  Chemotherapy    Bortezomib 1.11m/m2 QWk + Dexamethasone 119mQTue/Fri --Cycle #1, 02/18/17     02/18/2017 - 08/26/2017 Chemotherapy    The patient had bortezomib SQ (VELCADE) chemo injection 3 mg, 1.3 mg/m2 = 3 mg, Subcutaneous,  Once, 7 of 9 cycles Administration: 3 mg (02/18/2017), 3 mg (02/26/2017), 3 mg (03/04/2017), 3 mg (03/11/2017), 3 mg (04/08/2017), 3 mg (03/18/2017), 3 mg (03/25/2017), 3 mg (04/01/2017), 3 mg (05/06/2017), 3 mg (05/20/2017), 3 mg (06/03/2017), 3 mg (06/17/2017), 3 mg (07/01/2017), 3 mg (07/15/2017), 3 mg (07/29/2017), 3 mg (08/12/2017), 3 mg (08/26/2017)  for chemotherapy treatment.     09/17/2017 -  Chemotherapy    The patient had daratumumab (DARZALEX) 1,800 mg in sodium chloride 0.9 % 910 mL (1.8 mg/mL) chemo infusion, 15.9 mg/kg = 1,820 mg, Intravenous, Once, 1 of 1 cycle Administration: 1,800 mg (09/17/2017) daratumumab (DARZALEX) 1,800 mg in sodium chloride 0.9 % 410 mL (3.6 mg/mL) chemo infusion, 1,820 mg, Intravenous, Once, 8 of 12 cycles Administration: 1,800 mg (10/28/2017), 1,800 mg (11/04/2017), 1,800 mg (11/11/2017), 1,800 mg (11/18/2017), 1,800 mg (11/25/2017), 1,800 mg (12/02/2017), 1,800 mg (12/09/2017), 1,800 mg (12/16/2017), 1,800 mg  (12/30/2017), 1,800 mg (04/26/2018), 1,800 mg (01/13/2018), 1,800 mg (02/12/2018), 1,800 mg (02/26/2018), 1,800 mg (03/15/2018), 1,800 mg (03/29/2018), 1,800 mg (04/12/2018), 1,800 mg (05/10/2018)  for chemotherapy treatment.       CANCER STAGING: IgG Kappa MM   INTERVAL HISTORY:  Mr. HeGist28.o. male returns for routine follow-up and consideration for next cycle of chemotherapy.   He reports that his back pain was slightly worse in the last 1 week.  He also felt tired in the last 1 week.  Prior to that he was feeling very well.  His appetite has been good.  Denies any fevers or infections.  Denies any ER visits.  Appetite reported 75% and energy levels are 25%.  He is taking dexamethasone 10 mg weekly.    REVIEW OF SYSTEMS:  Review of Systems  Constitutional: Positive for chills.  Neurological: Positive for headaches.  All other systems reviewed and are negative.    PAST MEDICAL/SURGICAL HISTORY:  Past Medical History:  Diagnosis Date  . BPH (benign prostatic hyperplasia)   . Colonic diverticular abscess 01/08/2015  . Diverticulitis 1178/58/85 complicated by abscess and required percutaneous drainage  . GERD (gastroesophageal reflux disease)   . H/O ETOH abuse   . History of chemotherapy last done  jan 2017  . History of radiation therapy 01/05/13-02/10/13   45 gray to left occipital condyle region  . History of radiation therapy 12/01/16-12/10/16   Parasternal nodule, chest- 24 Gy total delivered in 8 fractions, Left sacro-iliac, pelvis- 24 Gy total delivered in 8 fractions   . History of radiation therapy 02/19/17-02/22/17   right temporal scalp 30 Gy in 10 fractions  . Intra-abdominal abscess ([place])   . Multiple myeloma ([facility]) 2015  . Neuropathy   . Radiation 02/21/14-03/08/14   right posterior chest wall area 30 gray  . Radiation 04/19/14-05/02/14   lumbar spine 25 gray  . Skull lesion    Left occipital condyle  . Wrist fracture, left    x 2   Past Surgical History:   Procedure Laterality Date  . COLONOSCOPY N/A 04/19/2015   Procedure: COLONOSCOPY;  Surgeon: Danie Binder, MD;  Location: [facility];  Service: Endoscopy;  Laterality: N/A;  1:30 PM  . CYST EXCISION  1959   tail bone  . IR IMAGING GUIDED PORT INSERTION  09/25/2017     SOCIAL HISTORY:  Social History   Socioeconomic History  . Marital status: Married    Spouse name: Not on file  . Number of children: 3  . Years of education: Not on file  . Highest education level: Not on file  Occupational History    Employer: RETIRED  Social Needs  . Financial resource strain: Not on file  . Food insecurity:    Worry: Not on file    Inability: Not on file  . Transportation needs:    Medical: Not on file    Non-medical: Not on file  Tobacco Use  . Smoking status: Never Smoker  . Smokeless tobacco: Never Used  Substance and Sexual Activity  . Alcohol use: No    Comment: " Not much no more"  . Drug use: No  . Sexual activity: Not on file  Lifestyle  . Physical activity:    Days per week: Not on file    Minutes per session: Not on file  . Stress: Not on file  Relationships  . Social connections:    Talks on phone: Not on file    Gets together: Not on file    Attends religious service: Not on file    Active member of club or organization: Not on file    Attends meetings of clubs or organizations: Not on file    Relationship status: Not on file  . Intimate partner violence:    Fear of current or ex partner: Not on file    Emotionally abused: Not on file    Physically abused: Not on file    Forced sexual activity: Not on file  Other Topics Concern  . Not on file  Social History Narrative  . Not on file    FAMILY HISTORY:  Family History  Problem Relation Age of Onset  . Diabetes Mother   . Stroke Mother   . Hypertension Father     CURRENT MEDICATIONS:  Outpatient Encounter Medications as of 06/07/2018  Medication Sig Note  . acyclovir (ZOVIRAX) 400 MG tablet Take 1  tablet (400 mg total) by mouth 2 (two) times daily.   Marland Kitchen ALPRAZolam (XANAX) 1 MG tablet Take 2 tablets by mouth at bedtime as needed for anxiety and sleep   . aspirin EC 325 MG tablet Take 1 tablet (325 mg total) by mouth daily.   Marland Kitchen CALCIUM-VITAMIN D PO Take 1 tablet by mouth 2 (  two) times daily.    . ciprofloxacin (CIPRO) 500 MG tablet Take 1 tablet (500 mg total) by mouth 2 (two) times daily.   Marland Kitchen dexamethasone (DECADRON) 4 MG tablet Take 2.5 tablets (10 mg total) by mouth once a week. Take 10 mg once weekly 09/18/2017: Started on 09/18/17  . furosemide (LASIX) 20 MG tablet TAKE 1 TABLET BY MOUTH DAILY AS NEEDED FOR EDEMA.   Marland Kitchen gabapentin (NEURONTIN) 400 MG capsule Take 1 capsule (400 mg total) by mouth 2 (two) times daily.   Marland Kitchen lidocaine-prilocaine (EMLA) cream Apply small amount over port one (1) hour prior to appointment.   Marland Kitchen MAGNESIUM PO Take 1 tablet by mouth at bedtime.   Marland Kitchen omeprazole (PRILOSEC) 20 MG capsule TAKE 1 CAPSULE BY MOUTH ONCE DAILY   . Oxycodone HCl 10 MG TABS Take 1 tablet (10 mg total) by mouth every 8 (eight) hours as needed.   . tamsulosin (FLOMAX) 0.4 MG CAPS capsule Take 1 capsule (0.4 mg total) by mouth at bedtime.   . temazepam (RESTORIL) 15 MG capsule TAKE 1 CAPSULE BY MOUTH AT BEDTIME AS NEEDED FOR SLEEP   . zolendronic acid (ZOMETA) 4 MG/5ML injection Inject 4 mg into the vein every 30 (thirty) days.     No facility-administered encounter medications on file as of 06/07/2018.     ALLERGIES:  Allergies  Allergen Reactions  . Codeine Other (See Comments)    Headache  . Morphine And Related Other (See Comments)    Makes him feel weird  . Revlimid [Lenalidomide] Other (See Comments)    "Causes me to become weak"     PHYSICAL EXAM:  ECOG Performance status: 1  Vitals:   06/07/18 0852  BP: (!) 133/58  Pulse: 65  Resp: 16  Temp: 97.8 F (36.6 C)  SpO2: 95%   Filed Weights   06/07/18 0852  Weight: 241 lb 6.4 oz (109.5 kg)    Physical Exam Vitals signs  reviewed.  Constitutional:      Appearance: Normal appearance.  Cardiovascular:     Rate and Rhythm: Normal rate and regular rhythm.     Heart sounds: Normal heart sounds.  Pulmonary:     Effort: Pulmonary effort is normal.     Breath sounds: Normal breath sounds.  Abdominal:     General: There is no distension.     Palpations: Abdomen is soft. There is no mass.  Musculoskeletal:        General: No swelling.  Skin:    General: Skin is warm.  Neurological:     General: No focal deficit present.     Mental Status: He is alert and oriented to person, place, and time.  Psychiatric:        Mood and Affect: Mood normal.        Behavior: Behavior normal.      LABORATORY DATA:  I have reviewed the labs as listed.  CBC    Component Value Date/Time   WBC 5.1 06/07/2018 0839   RBC 4.53 06/07/2018 0839   HGB 13.3 06/07/2018 0839   HCT 41.1 06/07/2018 0839   PLT 119 (L) 06/07/2018 0839   MCV 90.7 06/07/2018 0839   MCH 29.4 06/07/2018 0839   MCHC 32.4 06/07/2018 0839   RDW 14.2 06/07/2018 0839   LYMPHSABS 0.7 06/07/2018 0839   MONOABS 0.4 06/07/2018 0839   EOSABS 0.0 06/07/2018 0839   BASOSABS 0.0 06/07/2018 0839   CMP Latest Ref Rng & Units 06/07/2018 05/10/2018 04/26/2018  Glucose 70 -  99 mg/dL 109(H) 102(H) 108(H)  BUN 8 - 23 mg/dL _0 Creatinine 0.61 - 1.24 mg/dL 0.80 0.73 0.80  Sodium 135 - 145 mmol/L 141 139 141  Potassium 3.5 - 5.1 mmol/L 4.1 4.3 4.8  Chloride 98 - 111 mmol/L 108 105 108  CO2 22 - 32 mmol/L _1 Calcium 8.9 - 10.3 mg/dL 8.6(L) 8.6(L) 8.9  Total Protein 6.5 - 8.1 g/dL 5.7(L) 5.7(L) 6.2(L)  Total Bilirubin 0.3 - 1.2 mg/dL 0.5 0.5 0.6  Alkaline Phos 38 - 126 U/L 23(L) 23(L) 26(L)  AST 15 - 41 U/L 14(L) 16 17  ALT 0 - 44 U/L _2 DIAGNOSTIC IMAGING:  I have independently reviewed the scans and discussed with the patient.   I have reviewed Carver Fila, NP's note and agree with the documentation.  I personally performed a  face-to-face visit, made revisions and my assessment and plan is as follows.    ASSESSMENT & PLAN:   Multiple myeloma without remission ([facility]) 1.  IgG kappa multiple myeloma: - RVD from 04/07/2014 through 07/28/2014, maintenance Revlimid which was discontinued 1 and half year ago for unknown reasons.   -MRI in January of the brain showed new right temporal lesion, status post radiation therapy, PET/CT scan in January 2019 showed improvement of multiple myeloma with no new lesions, started on weekly Velcade and dexamethasone on 02/18/2017, status post 2 cycles, Velcade changed to every other week on 05/06/2017 secondary to neuropathy, takes dexamethasone 20 mg on days of Velcade. -Because of persistent back pain, I have done an MRI of the thoracic and lumbar spine.  We discussed the results of the scans today.  He had a new rib lesion close to the spine on the left side, possibly eighth rib.  There is a soft tissue mass in that area.  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  There is also chronic L4 compression fracture which is stable. -DPd started on 09/17/2017.  He developed erythematous maculopapular rash involving the trunk after taking 3 pills of pomalidomide.  It improved after steroids.  (Pomalidomide dose is 2 mg 3 weeks on 1 week off, dexamethasone doses 10 mg weekly). -Status post XRT to the left eighth rib lesion, 30 Gy in 10 fractions from 09/29/2017 through 10/14/2017. - Daratumumab restarted after radiation on 10/28/2017. - Pomalidomide was completely discontinued on 02/12/2018 for recurrent rash.  - Myeloma panel from 04/12/2018 shows M spike of 0.1 g/dL.  Free light chain ratio was 3.03. -We have reviewed his routine blood work.  He may proceed with daratumumab today.  I plan to see him back in 4 weeks for follow-up.  I will repeat his myeloma panel in 3 weeks. - He will continue acyclovir twice daily.  2.  Peripheral neuropathy: -he will continue her Neurontin 3 times  a day.  3.  Bone protection: -We will continue Zometa every 8 weeks.  He will continue calcium and vitamin D supplements.  4.  Low back pain: -This is from combination of degenerative disc disease and multiple myeloma. -He is taking oxycodone 10 mg 2 to 3 tablets/day which is helping.  He had slight worsening of pain in the last 1 week.  5.  Sleep problems: -He used to Restoril and trazodone in the past without help.  Ambien did not help him. -He is taking half tablet of Xanax with gabapentin which helps.   Total time spent is 25 minutes with more  than 50% of the time spent face-to-face discussing and reinforcing treatment plan, side effects and coordination of care.    Orders placed this encounter:  No orders of the defined types were placed in this encounter.     Derek Jack, MD [place] [phone]

## 2018-06-07 NOTE — Patient Instructions (Signed)
[facility] Discharge Instructions for Patients Receiving Chemotherapy  Today you received the following chemotherapy agents   To help prevent nausea and vomiting after your treatment, we encourage you to take your nausea medication   If you develop nausea and vomiting that is not controlled by your nausea medication, call the clinic.   BELOW ARE SYMPTOMS THAT SHOULD BE REPORTED IMMEDIATELY:  *FEVER GREATER THAN 100.5 F  *CHILLS WITH OR WITHOUT FEVER  NAUSEA AND VOMITING THAT IS NOT CONTROLLED WITH YOUR NAUSEA MEDICATION  *UNUSUAL SHORTNESS OF BREATH  *UNUSUAL BRUISING OR BLEEDING  TENDERNESS IN MOUTH AND THROAT WITH OR WITHOUT PRESENCE OF ULCERS  *URINARY PROBLEMS  *BOWEL PROBLEMS  UNUSUAL RASH Items with * indicate a potential emergency and should be followed up as soon as possible.  Feel free to call the clinic should you have any questions or concerns. The clinic phone number is [phone].  Please show the CHEMO ALERT CARD at check-in to the Emergency Department and triage nurse.   

## 2018-06-07 NOTE — Assessment & Plan Note (Signed)
1.  IgG kappa multiple myeloma: - RVD from 04/07/2014 through 07/28/2014, maintenance Revlimid which was discontinued 1 and half year ago for unknown reasons.   -MRI in January of the brain showed new right temporal lesion, status post radiation therapy, PET/CT scan in January 2019 showed improvement of multiple myeloma with no new lesions, started on weekly Velcade and dexamethasone on 02/18/2017, status post 2 cycles, Velcade changed to every other week on 05/06/2017 secondary to neuropathy, takes dexamethasone 20 mg on days of Velcade. -Because of persistent back pain, I have done an MRI of the thoracic and lumbar spine.  We discussed the results of the scans today.  He had a new rib lesion close to the spine on the left side, possibly eighth rib.  There is a soft tissue mass in that area.  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  There is also chronic L4 compression fracture which is stable. -DPd started on 09/17/2017.  He developed erythematous maculopapular rash involving the trunk after taking 3 pills of pomalidomide.  It improved after steroids.  (Pomalidomide dose is 2 mg 3 weeks on 1 week off, dexamethasone doses 10 mg weekly). -Status post XRT to the left eighth rib lesion, 30 Gy in 10 fractions from 09/29/2017 through 10/14/2017. - Daratumumab restarted after radiation on 10/28/2017. - Pomalidomide was completely discontinued on 02/12/2018 for recurrent rash.  - Myeloma panel from 04/12/2018 shows M spike of 0.1 g/dL.  Free light chain ratio was 3.03. -We have reviewed his routine blood work.  He may proceed with daratumumab today.  I plan to see him back in 4 weeks for follow-up.  I will repeat his myeloma panel in 3 weeks. - He will continue acyclovir twice daily.  2.  Peripheral neuropathy: -he will continue her Neurontin 3 times a day.  3.  Bone protection: -We will continue Zometa every 8 weeks.  He will continue calcium and vitamin D supplements.  4.  Low back  pain: -This is from combination of degenerative disc disease and multiple myeloma. -He is taking oxycodone 10 mg 2 to 3 tablets/day which is helping.  He had slight worsening of pain in the last 1 week.  5.  Sleep problems: -He used to Restoril and trazodone in the past without help.  Ambien did not help him. -He is taking half tablet of Xanax with gabapentin which helps.

## 2018-06-29 ENCOUNTER — Other Ambulatory Visit: Payer: Self-pay

## 2018-06-29 ENCOUNTER — Inpatient Hospital Stay (HOSPITAL_COMMUNITY): Payer: Medicare Other

## 2018-06-29 DIAGNOSIS — Z5112 Encounter for antineoplastic immunotherapy: Secondary | ICD-10-CM | POA: Diagnosis not present

## 2018-06-29 DIAGNOSIS — C9000 Multiple myeloma not having achieved remission: Secondary | ICD-10-CM | POA: Diagnosis not present

## 2018-06-29 LAB — CBC WITH DIFFERENTIAL/PLATELET
Abs Immature Granulocytes: 0.01 10*3/uL (ref 0.00–0.07)
Basophils Absolute: 0 10*3/uL (ref 0.0–0.1)
Basophils Relative: 0 %
Eosinophils Absolute: 0 10*3/uL (ref 0.0–0.5)
Eosinophils Relative: 0 %
HCT: 44 % (ref 39.0–52.0)
Hemoglobin: 14.2 g/dL (ref 13.0–17.0)
Immature Granulocytes: 0 %
Lymphocytes Relative: 21 %
Lymphs Abs: 1.2 10*3/uL (ref 0.7–4.0)
MCH: 29.8 pg (ref 26.0–34.0)
MCHC: 32.3 g/dL (ref 30.0–36.0)
MCV: 92.4 fL (ref 80.0–100.0)
Monocytes Absolute: 0.6 10*3/uL (ref 0.1–1.0)
Monocytes Relative: 11 %
Neutro Abs: 3.9 10*3/uL (ref 1.7–7.7)
Neutrophils Relative %: 68 %
Platelets: 122 10*3/uL — ABNORMAL LOW (ref 150–400)
RBC: 4.76 MIL/uL (ref 4.22–5.81)
RDW: 13.7 % (ref 11.5–15.5)
WBC: 5.7 10*3/uL (ref 4.0–10.5)
nRBC: 0 % (ref 0.0–0.2)

## 2018-06-29 LAB — COMPREHENSIVE METABOLIC PANEL
ALT: 14 U/L (ref 0–44)
AST: 17 U/L (ref 15–41)
Albumin: 3.5 g/dL (ref 3.5–5.0)
Alkaline Phosphatase: 25 U/L — ABNORMAL LOW (ref 38–126)
Anion gap: 9 (ref 5–15)
BUN: 18 mg/dL (ref 8–23)
CO2: 23 mmol/L (ref 22–32)
Calcium: 8.5 mg/dL — ABNORMAL LOW (ref 8.9–10.3)
Chloride: 108 mmol/L (ref 98–111)
Creatinine, Ser: 0.72 mg/dL (ref 0.61–1.24)
GFR calc Af Amer: >60 mL/min (ref >60)
GFR calc non Af Amer: >60 mL/min (ref >60)
Glucose, Bld: 120 mg/dL — ABNORMAL HIGH (ref 70–99)
Potassium: 3.9 mmol/L (ref 3.5–5.1)
Sodium: 140 mmol/L (ref 135–145)
Total Bilirubin: 0.5 mg/dL (ref 0.3–1.2)
Total Protein: 5.7 g/dL — ABNORMAL LOW (ref 6.5–8.1)

## 2018-07-02 ENCOUNTER — Other Ambulatory Visit (HOSPITAL_COMMUNITY): Payer: Self-pay | Admitting: *Deleted

## 2018-07-02 ENCOUNTER — Other Ambulatory Visit: Payer: Self-pay | Admitting: Family Medicine

## 2018-07-02 DIAGNOSIS — C9000 Multiple myeloma not having achieved remission: Secondary | ICD-10-CM

## 2018-07-02 DIAGNOSIS — G4700 Insomnia, unspecified: Secondary | ICD-10-CM

## 2018-07-02 DIAGNOSIS — N400 Enlarged prostate without lower urinary tract symptoms: Secondary | ICD-10-CM

## 2018-07-02 DIAGNOSIS — G629 Polyneuropathy, unspecified: Secondary | ICD-10-CM

## 2018-07-02 MED ORDER — GABAPENTIN 400 MG PO CAPS: 400.0000 mg | ORAL_CAPSULE | Freq: Three times a day (TID) | ORAL | 4 refills | Status: DC

## 2018-07-02 NOTE — Telephone Encounter (Signed)
Requested Prescriptions   Pending Prescriptions Disp Refills  . ALPRAZolam (XANAX) 1 MG tablet [Pharmacy Med Name: ALPRAZOLAM 1 MG TAB] 60 tablet 0    Sig: TAKE 2 TABLETS BY MOUTH AT BEDTIME AS NEEDED FOR ANXIETY OR SLEEP   Signed Prescriptions Disp Refills  . tamsulosin (FLOMAX) 0.4 MG CAPS capsule 30 capsule 3    Sig: TAKE 1 CAPSULE BY MOUTH EVERY NIGHT AT BEDTIME    Authorizing Provider: Alycia Rossetti    Ordering User: Vanice Sarah   Last OV 03/30/2018 Last written 04/12/2018

## 2018-07-02 NOTE — Telephone Encounter (Signed)
Per last office note, patient is taking Gabapentin three times daily.  New refill sent in with reflection of correct dosage and quantity per month.

## 2018-07-05 ENCOUNTER — Encounter (HOSPITAL_COMMUNITY): Payer: Self-pay

## 2018-07-05 ENCOUNTER — Inpatient Hospital Stay (HOSPITAL_COMMUNITY): Payer: Medicare Other | Attending: Hematology

## 2018-07-05 ENCOUNTER — Other Ambulatory Visit: Payer: Self-pay

## 2018-07-05 ENCOUNTER — Encounter (HOSPITAL_COMMUNITY): Payer: Self-pay | Admitting: Hematology

## 2018-07-05 ENCOUNTER — Inpatient Hospital Stay (HOSPITAL_BASED_OUTPATIENT_CLINIC_OR_DEPARTMENT_OTHER): Payer: Medicare Other | Admitting: Hematology

## 2018-07-05 ENCOUNTER — Inpatient Hospital Stay (HOSPITAL_COMMUNITY): Payer: Medicare Other

## 2018-07-05 VITALS — BP 110/55 | HR 68 | Temp 97.5°F | Resp 18

## 2018-07-05 DIAGNOSIS — Z923 Personal history of irradiation: Secondary | ICD-10-CM

## 2018-07-05 DIAGNOSIS — R300 Dysuria: Secondary | ICD-10-CM | POA: Insufficient documentation

## 2018-07-05 DIAGNOSIS — M549 Dorsalgia, unspecified: Secondary | ICD-10-CM | POA: Diagnosis not present

## 2018-07-05 DIAGNOSIS — G629 Polyneuropathy, unspecified: Secondary | ICD-10-CM | POA: Insufficient documentation

## 2018-07-05 DIAGNOSIS — C9000 Multiple myeloma not having achieved remission: Secondary | ICD-10-CM

## 2018-07-05 DIAGNOSIS — G479 Sleep disorder, unspecified: Secondary | ICD-10-CM

## 2018-07-05 DIAGNOSIS — G4700 Insomnia, unspecified: Secondary | ICD-10-CM | POA: Insufficient documentation

## 2018-07-05 DIAGNOSIS — Z79899 Other long term (current) drug therapy: Secondary | ICD-10-CM

## 2018-07-05 DIAGNOSIS — Z5112 Encounter for antineoplastic immunotherapy: Secondary | ICD-10-CM | POA: Insufficient documentation

## 2018-07-05 DIAGNOSIS — M545 Low back pain: Secondary | ICD-10-CM | POA: Diagnosis not present

## 2018-07-05 LAB — CBC WITH DIFFERENTIAL/PLATELET
Abs Immature Granulocytes: 0.01 10*3/uL (ref 0.00–0.07)
Basophils Absolute: 0 10*3/uL (ref 0.0–0.1)
Basophils Relative: 0 %
Eosinophils Absolute: 0 10*3/uL (ref 0.0–0.5)
Eosinophils Relative: 1 %
HCT: 41.8 % (ref 39.0–52.0)
Hemoglobin: 13.8 g/dL (ref 13.0–17.0)
Immature Granulocytes: 0 %
Lymphocytes Relative: 16 %
Lymphs Abs: 0.6 10*3/uL — ABNORMAL LOW (ref 0.7–4.0)
MCH: 30.3 pg (ref 26.0–34.0)
MCHC: 33 g/dL (ref 30.0–36.0)
MCV: 91.9 fL (ref 80.0–100.0)
Monocytes Absolute: 0.3 10*3/uL (ref 0.1–1.0)
Monocytes Relative: 8 %
Neutro Abs: 3 10*3/uL (ref 1.7–7.7)
Neutrophils Relative %: 75 %
Platelets: 125 10*3/uL — ABNORMAL LOW (ref 150–400)
RBC: 4.55 MIL/uL (ref 4.22–5.81)
RDW: 14.2 % (ref 11.5–15.5)
WBC: 4 10*3/uL (ref 4.0–10.5)
nRBC: 0 % (ref 0.0–0.2)

## 2018-07-05 LAB — COMPREHENSIVE METABOLIC PANEL
ALT: 13 U/L (ref 0–44)
AST: 16 U/L (ref 15–41)
Albumin: 3.6 g/dL (ref 3.5–5.0)
Alkaline Phosphatase: 20 U/L — ABNORMAL LOW (ref 38–126)
Anion gap: 13 (ref 5–15)
BUN: 23 mg/dL (ref 8–23)
CO2: 24 mmol/L (ref 22–32)
Calcium: 8.6 mg/dL — ABNORMAL LOW (ref 8.9–10.3)
Chloride: 104 mmol/L (ref 98–111)
Creatinine, Ser: 0.81 mg/dL (ref 0.61–1.24)
GFR calc Af Amer: >60 mL/min (ref >60)
GFR calc non Af Amer: >60 mL/min (ref >60)
Glucose, Bld: 109 mg/dL — ABNORMAL HIGH (ref 70–99)
Potassium: 3.9 mmol/L (ref 3.5–5.1)
Sodium: 141 mmol/L (ref 135–145)
Total Bilirubin: 0.5 mg/dL (ref 0.3–1.2)
Total Protein: 5.9 g/dL — ABNORMAL LOW (ref 6.5–8.1)

## 2018-07-05 LAB — LACTATE DEHYDROGENASE: LDH: 134 U/L (ref 98–192)

## 2018-07-05 MED ORDER — SODIUM CHLORIDE 0.9 % IV SOLN
20.0000 mg | Freq: Once | INTRAVENOUS | Status: AC
  Administered 2018-07-05: 20 mg via INTRAVENOUS
  Filled 2018-07-05: qty 20

## 2018-07-05 MED ORDER — DIPHENHYDRAMINE HCL 25 MG PO CAPS
50.0000 mg | ORAL_CAPSULE | Freq: Once | ORAL | Status: AC
  Administered 2018-07-05: 50 mg via ORAL
  Filled 2018-07-05: qty 2

## 2018-07-05 MED ORDER — OXYCODONE HCL 10 MG PO TABS: 10.0000 mg | ORAL_TABLET | Freq: Three times a day (TID) | ORAL | 0 refills | Status: DC | PRN

## 2018-07-05 MED ORDER — PROCHLORPERAZINE MALEATE 10 MG PO TABS
10.0000 mg | ORAL_TABLET | Freq: Once | ORAL | Status: AC
  Administered 2018-07-05: 10 mg via ORAL
  Filled 2018-07-05: qty 1

## 2018-07-05 MED ORDER — HEPARIN SOD (PORK) LOCK FLUSH 100 UNIT/ML IV SOLN
500.0000 [IU] | Freq: Once | INTRAVENOUS | Status: AC | PRN
  Administered 2018-07-05: 500 [IU]

## 2018-07-05 MED ORDER — SODIUM CHLORIDE 0.9 % IV SOLN
15.8000 mg/kg | Freq: Once | INTRAVENOUS | Status: AC
  Administered 2018-07-05: 1800 mg via INTRAVENOUS
  Filled 2018-07-05: qty 80

## 2018-07-05 MED ORDER — SODIUM CHLORIDE 0.9 % IV SOLN
Freq: Once | INTRAVENOUS | Status: AC
  Administered 2018-07-05: 11:00:00 via INTRAVENOUS

## 2018-07-05 MED ORDER — SODIUM CHLORIDE 0.9% FLUSH
10.0000 mL | INTRAVENOUS | Status: DC | PRN
  Administered 2018-07-05 (×2): 10 mL
  Filled 2018-07-05 (×2): qty 10

## 2018-07-05 MED ORDER — ACETAMINOPHEN 325 MG PO TABS
650.0000 mg | ORAL_TABLET | Freq: Once | ORAL | Status: AC
  Administered 2018-07-05: 650 mg via ORAL
  Filled 2018-07-05: qty 2

## 2018-07-05 NOTE — Assessment & Plan Note (Addendum)
1.  IgG kappa multiple myeloma: - RVD from 04/07/2014 through 07/28/2014, maintenance Revlimid which was discontinued 1 and half year ago for unknown reasons.   -MRI in January of the brain showed new right temporal lesion, status post radiation therapy, PET/CT scan in January 2019 showed improvement of multiple myeloma with no new lesions, started on weekly Velcade and dexamethasone on 02/18/2017, status post 2 cycles, Velcade changed to every other week on 05/06/2017 secondary to neuropathy, takes dexamethasone 20 mg on days of Velcade. -Because of persistent back pain, I have done an MRI of the thoracic and lumbar spine.  We discussed the results of the scans today.  He had a new rib lesion close to the spine on the left side, possibly eighth rib.  There is a soft tissue mass in that area.  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  There is also chronic L4 compression fracture which is stable. -DPd started on 09/17/2017.  He developed erythematous maculopapular rash involving the trunk after taking 3 pills of pomalidomide.  It improved after steroids.  (Pomalidomide dose is 2 mg 3 weeks on 1 week off, dexamethasone doses 10 mg weekly). -Status post XRT to the left eighth rib lesion, 30 Gy in 10 fractions from 09/29/2017 through 10/14/2017. - Daratumumab restarted after radiation on 10/28/2017. -Pomalidomide was completely discontinued on 02/12/2018 for recurrent rash. -Myeloma panel from 04/12/2018 shows M spike of 0.1 grams per deciliter.  Free light chain ratio was 3.03. -We have sent myeloma panel from today which we will follow. -He will proceed with daratumumab today.  We will see him back in 4 weeks for follow-up.  He will continue acyclovir twice daily. -He is reportedly seeing dentist next week.  His last Zometa was on 06/07/2018.  2.  Peripheral neuropathy: -he will continue her Neurontin 3 times a day.  3.  Bone protection: -He is receiving Zometa every 8 weeks.  He will  continue calcium and vitamin D supplements.  4.  Low back pain: -This is from combination of degenerative disc disease and multiple myeloma. -He is taking oxycodone 10 mg 2 to 3 tablets/day.  5.  Sleep problems: -He used to Restoril and trazodone in the past without help.  Ambien did not help him. -He is taking half tablet of Xanax with gabapentin which helps.

## 2018-07-05 NOTE — Patient Instructions (Addendum)
[facility] at [name] Discharge Instructions  You were seen today by Dr. Katragadda. He went over your recent lab results. He will see you back in 4 weeks for labs and follow up.   Thank you for choosing [facility] at [facility] to provide your oncology and hematology care.  To afford each patient quality time with our provider, please arrive at least 15 minutes before your scheduled appointment time.   If you have a lab appointment with the Cancer Center please come in thru the  Main Entrance and check in at the main information desk  You need to re-schedule your appointment should you arrive 10 or more minutes late.  We strive to give you quality time with our providers, and arriving late affects you and other patients whose appointments are after yours.  Also, if you no show three or more times for appointments you may be dismissed from the clinic at the providers discretion.     Again, thank you for choosing [facility].  Our hope is that these requests will decrease the amount of time that you wait before being seen by our physicians.       _____________________________________________________________  Should you have questions after your visit to [facility], please contact our office at [phone] between the hours of 8:00 a.m. and 4:30 p.m.  Voicemails left after 4:00 p.m. will not be returned until the following business day.  For prescription refill requests, have your pharmacy contact our office and allow 72 hours.    Cancer Center Support Programs:   > Cancer Support Group  2nd Tuesday of the month 1pm-2pm, Journey Room    

## 2018-07-05 NOTE — Progress Notes (Signed)
Treatment given today per MD orders. Tolerated infusion without adverse affects. Vital signs stable. No complaints at this time. Discharged from clinic ambulatory. F/U with [facility] as scheduled.   

## 2018-07-05 NOTE — Progress Notes (Signed)
[place], [place] [address]   CLINIC:  Medical Oncology/Hematology  PCP:  [name], MD [address] [place] [address] [phone]   REASON FOR VISIT:  Follow-up for  IgG kappa multiple myeloma   BRIEF ONCOLOGIC HISTORY:    Multiple myeloma without remission ([place])   11/16/2012 Initial Diagnosis    L occipital condyle destructive lesion, not biopsied secondary to location. XRT    12/01/2012 Imaging    L3 superior endplate compression FX, no lytic lesions to suggest myeloma    02/06/2014 Imaging    soft tissue mass along the right posterior fifth rib with some rib destruction    02/16/2014 Miscellaneous    Normal CBC, Normal CMP, kappa,lamda ratio of 2.62 (abnormal), UIEP with slightly restricted band, IgG kappa, SPEP/IEP with monoclonal protein at 0.79 g/dl, normal igG, suppressed IGM    02/20/2014 Bone Marrow Biopsy    33% kappa restricted plasma cells Normal FISH, normal cytogenetics    02/21/2014 - 03/08/2014 Radiation Therapy    30Gy to R rib lesion, lesion not biopsied    03/16/2014 PET scan    Scattered hypermetabolic osseous lesions, including the calvarium, ribs, left scapula, sacrum, and left femoral shaft. An expansile left vertebral body lesion at L3 extends into the epidural space of the left lateral recess,     03/17/2014 - 04/07/2014 Chemotherapy    Velcade and Dexamethasone due to initial denial of Revlimid by insurance company.  Zometa monthly.    03/22/2014 Imaging    MR_ L-Spine- Enhancing lesions compatible with multiple myeloma at L2, L3, and S1-2.    04/07/2014 - 07/28/2014 Chemotherapy    RVD with Revlimid at 25 mg days 1-14.  Revlimid dose reduced to 25 mg every other day x 14 days with 7 day respite beginning on day 1 of cycle 2.    04/17/2014 Adverse Reaction    Revlimid-induced rash.  Treated with steroids.    07/28/2014 - 08/04/2014 Chemotherapy    Revlimid daily    08/04/2014 Adverse Reaction    Velcade-induced peripheral neuropathy    08/04/2014 Treatment Plan Change    D/C Velcade    08/11/2014 PET scan    Response to therapy. Improvement and resolution of foci of osseous hypermetabolism.    08/22/2014 - 08/28/2014 Chemotherapy    Revlimid 25 mg days 1-21 every 28 days    08/28/2014 Adverse Reaction    Revlimid-induced rash. Medication held.  Medrol dose Pak prescribed.    09/04/2014 - 01/08/2015 Chemotherapy    Revlimid 10 mg every other day, without dexamethasone    01/08/2015 - 01/15/2015 Hospital Admission    Colonic diverticular abscess with IR drain placement    03/02/2015 PET scan    Only bony uptake is low activity at site of deformity/callus at R second rib FX, high activity in sigmoid colon, advanced sigmoid divertidulosis. Abscess not resolved?    06/04/2015 Imaging    CT nonobstructive L renal calculus, diverticulosis of descending and sigmoid colon without inflammation, moderate prostatic enlargement    07/12/2015 Surgery    Diverticulitis s/p robotic sigmoid colectomy with Dr. Johney Maine    08/23/2015 PET scan    Primarily similar hypermetabolic osseous foci within the R sided ribs. new L third rib focus of hypermetab and sclerosis is favored to be related to healing FX. no soft tissue myeloma id'd. Presumed postop hypermetab and edema about the R pelvic wall     03/06/2016 PET scan  1. Reduced activity in the prior lesion such as the right second and fifth rib lesions, activity nearly completely resolved and significantly less than added mediastinal blood pool activity. No new lesions are identified. 2. Other imaging findings of potential clinical significance: Coronary, aortic arch, and branch vessel atherosclerotic vascular disease. Aortoiliac atherosclerotic vascular disease. Enlarged prostate gland. Colonic diverticula.    02/17/2017 PET scan    HEAD/NECK: No hypermetabolic activity in the scalp. No hypermetabolic cervical lymph nodes. CHEST: No hypermetabolic  mediastinal or hilar nodes. Right upper lobe scarring/atelectasis. No suspicious pulmonary nodules on the CT scan. ABDOMEN/PELVIS: No abnormal hypermetabolic activity within the liver, pancreas, adrenal glands, or spleen. No hypermetabolic lymph nodes in the abdomen or pelvis. Atherosclerotic calcifications of the abdominal aorta and branch vessels. Left renal sinus cysts. Sigmoid diverticulosis, without evidence of diverticulitis. Prostatomegaly. SKELETON: Vague hypermetabolism involving the inferior sternum, max SUV 3.2, previously 8.2. Focal hypermetabolism involving the left sacrum, max SUV 4.0, previously 6.7. EXTREMITIES: No abnormal hypermetabolic activity in the lower extremities.    02/18/2017 -  Chemotherapy    Bortezomib 1.63m/m2 QWk + Dexamethasone 150mQTue/Fri --Cycle #1, 02/18/17     02/18/2017 - 08/26/2017 Chemotherapy    The patient had bortezomib SQ (VELCADE) chemo injection 3 mg, 1.3 mg/m2 = 3 mg, Subcutaneous,  Once, 7 of 9 cycles Administration: 3 mg (02/18/2017), 3 mg (02/26/2017), 3 mg (03/04/2017), 3 mg (03/11/2017), 3 mg (04/08/2017), 3 mg (03/18/2017), 3 mg (03/25/2017), 3 mg (04/01/2017), 3 mg (05/06/2017), 3 mg (05/20/2017), 3 mg (06/03/2017), 3 mg (06/17/2017), 3 mg (07/01/2017), 3 mg (07/15/2017), 3 mg (07/29/2017), 3 mg (08/12/2017), 3 mg (08/26/2017)  for chemotherapy treatment.     09/17/2017 -  Chemotherapy    The patient had daratumumab (DARZALEX) 1,800 mg in sodium chloride 0.9 % 910 mL (1.8 mg/mL) chemo infusion, 15.9 mg/kg = 1,820 mg, Intravenous, Once, 1 of 1 cycle Administration: 1,800 mg (09/17/2017) daratumumab (DARZALEX) 1,800 mg in sodium chloride 0.9 % 410 mL (3.6 mg/mL) chemo infusion, 1,820 mg, Intravenous, Once, 9 of 12 cycles Administration: 1,800 mg (10/28/2017), 1,800 mg (11/04/2017), 1,800 mg (11/11/2017), 1,800 mg (11/18/2017), 1,800 mg (11/25/2017), 1,800 mg (12/02/2017), 1,800 mg (12/09/2017), 1,800 mg (12/16/2017), 1,800 mg (12/30/2017), 1,800 mg (04/26/2018), 1,800 mg  (01/13/2018), 1,800 mg (02/12/2018), 1,800 mg (02/26/2018), 1,800 mg (03/15/2018), 1,800 mg (03/29/2018), 1,800 mg (04/12/2018), 1,800 mg (05/10/2018), 1,800 mg (06/07/2018)  for chemotherapy treatment.       CANCER STAGING: Cancer Staging No matching staging information was found for the patient.   INTERVAL HISTORY:  Mr. HeKlas37.o. male returns for routine follow-up and consideration for next cycle of chemotherapy. He is here today alone. He states that is has experienced weakness since his last visit. He states that he has chills at times. He states that his pain is usually controlled with taking two pills a day. He continues to drink carnation instant breakfast a few times a week. Denies any nausea, vomiting, or diarrhea. Denies any new pains. Had not noticed any recent bleeding such as epistaxis, hematuria or hematochezia. Denies recent chest pain on exertion, shortness of breath on minimal exertion, pre-syncopal episodes, or palpitations. Denies any numbness or tingling in hands. Denies any recent fevers, infections, or recent hospitalizations. Patient reports appetite at 25% and energy level at 0%.     REVIEW OF SYSTEMS:  Review of Systems  Constitutional: Positive for fatigue.  Neurological: Positive for numbness.     PAST MEDICAL/SURGICAL HISTORY:  Past Medical History:  Diagnosis Date  .  BPH (benign prostatic hyperplasia)   . Colonic diverticular abscess 01/08/2015  . Diverticulitis 62/83/66   complicated by abscess and required percutaneous drainage  . GERD (gastroesophageal reflux disease)   . H/O ETOH abuse   . History of chemotherapy last done jan 2017  . History of radiation therapy 01/05/13-02/10/13   45 gray to left occipital condyle region  . History of radiation therapy 12/01/16-12/10/16   Parasternal nodule, chest- 24 Gy total delivered in 8 fractions, Left sacro-iliac, pelvis- 24 Gy total delivered in 8 fractions   . History of radiation therapy 02/19/17-02/22/17    right temporal scalp 30 Gy in 10 fractions  . Intra-abdominal abscess ([place])   . Multiple myeloma ([place]) 2015  . Neuropathy   . Radiation 02/21/14-03/08/14   right posterior chest wall area 30 gray  . Radiation 04/19/14-05/02/14   lumbar spine 25 gray  . Skull lesion    Left occipital condyle  . Wrist fracture, left    x 2   Past Surgical History:  Procedure Laterality Date  . COLONOSCOPY N/A 04/19/2015   Procedure: COLONOSCOPY;  Surgeon: Danie Binder, MD;  Location: [facility];  Service: Endoscopy;  Laterality: N/A;  1:30 PM  . CYST EXCISION  1959   tail bone  . IR IMAGING GUIDED PORT INSERTION  09/25/2017     SOCIAL HISTORY:  Social History   Socioeconomic History  . Marital status: Married    Spouse name: Not on file  . Number of children: 3  . Years of education: Not on file  . Highest education level: Not on file  Occupational History    Employer: RETIRED  Social Needs  . Financial resource strain: Not on file  . Food insecurity:    Worry: Not on file    Inability: Not on file  . Transportation needs:    Medical: Not on file    Non-medical: Not on file  Tobacco Use  . Smoking status: Never Smoker  . Smokeless tobacco: Never Used  Substance and Sexual Activity  . Alcohol use: No    Comment: " Not much no more"  . Drug use: No  . Sexual activity: Not on file  Lifestyle  . Physical activity:    Days per week: Not on file    Minutes per session: Not on file  . Stress: Not on file  Relationships  . Social connections:    Talks on phone: Not on file    Gets together: Not on file    Attends religious service: Not on file    Active member of club or organization: Not on file    Attends meetings of clubs or organizations: Not on file    Relationship status: Not on file  . Intimate partner violence:    Fear of current or ex partner: Not on file    Emotionally abused: Not on file    Physically abused: Not on file    Forced sexual activity: Not on file  Other  Topics Concern  . Not on file  Social History Narrative  . Not on file    FAMILY HISTORY:  Family History  Problem Relation Age of Onset  . Diabetes Mother   . Stroke Mother   . Hypertension Father     CURRENT MEDICATIONS:  Outpatient Encounter Medications as of 07/05/2018  Medication Sig Note  . acyclovir (ZOVIRAX) 400 MG tablet Take 1 tablet (400 mg total) by mouth 2 (two) times daily.   Marland Kitchen ALPRAZolam Duanne Moron) 1  MG tablet TAKE 2 TABLETS BY MOUTH AT BEDTIME AS NEEDED FOR ANXIETY OR SLEEP   . aspirin EC 325 MG tablet Take 1 tablet (325 mg total) by mouth daily.   Marland Kitchen CALCIUM-VITAMIN D PO Take 1 tablet by mouth 2 (two) times daily.    . ciprofloxacin (CIPRO) 500 MG tablet Take 1 tablet (500 mg total) by mouth 2 (two) times daily.   Marland Kitchen dexamethasone (DECADRON) 4 MG tablet Take 2.5 tablets (10 mg total) by mouth once a week. Take 10 mg once weekly 09/18/2017: Started on 09/18/17  . furosemide (LASIX) 20 MG tablet TAKE 1 TABLET BY MOUTH DAILY AS NEEDED FOR EDEMA.   Marland Kitchen gabapentin (NEURONTIN) 400 MG capsule Take 1 capsule (400 mg total) by mouth 3 (three) times daily.   Marland Kitchen lidocaine-prilocaine (EMLA) cream Apply small amount over port one (1) hour prior to appointment.   Marland Kitchen MAGNESIUM PO Take 1 tablet by mouth at bedtime.   Marland Kitchen omeprazole (PRILOSEC) 20 MG capsule TAKE 1 CAPSULE BY MOUTH ONCE DAILY   . Oxycodone HCl 10 MG TABS Take 1 tablet (10 mg total) by mouth every 8 (eight) hours as needed.   . tamsulosin (FLOMAX) 0.4 MG CAPS capsule TAKE 1 CAPSULE BY MOUTH EVERY NIGHT AT BEDTIME   . zolendronic acid (ZOMETA) 4 MG/5ML injection Inject 4 mg into the vein every 30 (thirty) days.    . [DISCONTINUED] Oxycodone HCl 10 MG TABS Take 1 tablet (10 mg total) by mouth every 8 (eight) hours as needed.   . temazepam (RESTORIL) 15 MG capsule TAKE 1 CAPSULE BY MOUTH AT BEDTIME AS NEEDED FOR SLEEP (Patient not taking: Reported on 07/05/2018)    No facility-administered encounter medications on file as of 07/05/2018.      ALLERGIES:  Allergies  Allergen Reactions  . Codeine Other (See Comments)    Headache  . Morphine And Related Other (See Comments)    Makes him feel weird  . Revlimid [Lenalidomide] Other (See Comments)    "Causes me to become weak"     PHYSICAL EXAM:  ECOG Performance status: 1  Vitals:   07/05/18 0931  BP: 96/78  Pulse: 71  Resp: 20  Temp: 97.8 F (36.6 C)  SpO2: 97%   Filed Weights   07/05/18 0931  Weight: 235 lb 4.8 oz (106.7 kg)    Physical Exam Vitals signs reviewed.  Constitutional:      Appearance: Normal appearance.  Cardiovascular:     Rate and Rhythm: Normal rate and regular rhythm.     Heart sounds: Normal heart sounds.  Pulmonary:     Effort: Pulmonary effort is normal.     Breath sounds: Normal breath sounds.  Abdominal:     General: There is no distension.     Palpations: Abdomen is soft. There is no mass.  Musculoskeletal:        General: No swelling.  Skin:    General: Skin is warm.  Neurological:     General: No focal deficit present.     Mental Status: He is alert and oriented to person, place, and time.  Psychiatric:        Mood and Affect: Mood normal.        Behavior: Behavior normal.      LABORATORY DATA:  I have reviewed the labs as listed.  CBC    Component Value Date/Time   WBC 4.0 07/05/2018 0915   RBC 4.55 07/05/2018 0915   HGB 13.8 07/05/2018 0915   HCT 41.8  07/05/2018 0915   PLT 125 (L) 07/05/2018 0915   MCV 91.9 07/05/2018 0915   MCH 30.3 07/05/2018 0915   MCHC 33.0 07/05/2018 0915   RDW 14.2 07/05/2018 0915   LYMPHSABS 0.6 (L) 07/05/2018 0915   MONOABS 0.3 07/05/2018 0915   EOSABS 0.0 07/05/2018 0915   BASOSABS 0.0 07/05/2018 0915   CMP Latest Ref Rng & Units 07/05/2018 06/29/2018 06/07/2018  Glucose 70 - 99 mg/dL 109(H) 120(H) 109(H)  BUN 8 - 23 mg/dL 23 18 21   Creatinine 0.61 - 1.24 mg/dL 0.81 0.72 0.80  Sodium 135 - 145 mmol/L 141 140 141  Potassium 3.5 - 5.1 mmol/L 3.9 3.9 4.1  Chloride 98 - 111  mmol/L 104 108 108  CO2 22 - 32 mmol/L 24 23 24   Calcium 8.9 - 10.3 mg/dL 8.6(L) 8.5(L) 8.6(L)  Total Protein 6.5 - 8.1 g/dL 5.9(L) 5.7(L) 5.7(L)  Total Bilirubin 0.3 - 1.2 mg/dL 0.5 0.5 0.5  Alkaline Phos 38 - 126 U/L 20(L) 25(L) 23(L)  AST 15 - 41 U/L 16 17 14(L)  ALT 0 - 44 U/L 13 14 11        DIAGNOSTIC IMAGING:  I have independently reviewed the scans and discussed with the patient.   I have reviewed Venita Lick LPN's note and agree with the documentation.  I personally performed a face-to-face visit, made revisions and my assessment and plan is as follows.    ASSESSMENT & PLAN:   Multiple myeloma without remission ([facility]) 1.  IgG kappa multiple myeloma: - RVD from 04/07/2014 through 07/28/2014, maintenance Revlimid which was discontinued 1 and half year ago for unknown reasons.   -MRI in January of the brain showed new right temporal lesion, status post radiation therapy, PET/CT scan in January 2019 showed improvement of multiple myeloma with no new lesions, started on weekly Velcade and dexamethasone on 02/18/2017, status post 2 cycles, Velcade changed to every other week on 05/06/2017 secondary to neuropathy, takes dexamethasone 20 mg on days of Velcade. -Because of persistent back pain, I have done an MRI of the thoracic and lumbar spine.  We discussed the results of the scans today.  He had a new rib lesion close to the spine on the left side, possibly eighth rib.  There is a soft tissue mass in that area.  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  There is also chronic L4 compression fracture which is stable. -DPd started on 09/17/2017.  He developed erythematous maculopapular rash involving the trunk after taking 3 pills of pomalidomide.  It improved after steroids.  (Pomalidomide dose is 2 mg 3 weeks on 1 week off, dexamethasone doses 10 mg weekly). -Status post XRT to the left eighth rib lesion, 30 Gy in 10 fractions from 09/29/2017 through 10/14/2017. -  Daratumumab restarted after radiation on 10/28/2017. -Pomalidomide was completely discontinued on 02/12/2018 for recurrent rash. -Myeloma panel from 04/12/2018 shows M spike of 0.1 grams per deciliter.  Free light chain ratio was 3.03. -We have sent myeloma panel from today which we will follow. -He will proceed with daratumumab today.  We will see him back in 4 weeks for follow-up.  He will continue acyclovir twice daily. -He is reportedly seeing dentist next week.  His last Zometa was on 06/07/2018.  2.  Peripheral neuropathy: -he will continue her Neurontin 3 times a day.  3.  Bone protection: -He is receiving Zometa every 8 weeks.  He will continue calcium and vitamin D supplements.  4.  Low back pain: -This  is from combination of degenerative disc disease and multiple myeloma. -He is taking oxycodone 10 mg 2 to 3 tablets/day.  5.  Sleep problems: -He used to Restoril and trazodone in the past without help.  Ambien did not help him. -He is taking half tablet of Xanax with gabapentin which helps.   Total time spent is 25 minutes with more than 50% of the time spent face-to-face discussing and reinforcing treatment plan and coordination of care.    Orders placed this encounter:  Orders Placed This Encounter  Procedures  . Protein electrophoresis, serum  . Kappa/lambda light chains  . Lactate dehydrogenase      Derek Jack, MD [place] [phone]

## 2018-07-06 LAB — PROTEIN ELECTROPHORESIS, SERUM
A/G Ratio: 1.5 (ref 0.7–1.7)
Albumin ELP: 3.2 g/dL (ref 2.9–4.4)
Alpha-1-Globulin: 0.3 g/dL (ref 0.0–0.4)
Alpha-2-Globulin: 0.9 g/dL (ref 0.4–1.0)
Beta Globulin: 0.8 g/dL (ref 0.7–1.3)
Gamma Globulin: 0.2 g/dL — ABNORMAL LOW (ref 0.4–1.8)
Globulin, Total: 2.2 g/dL (ref 2.2–3.9)
M-Spike, %: 0.1 g/dL — ABNORMAL HIGH
Total Protein ELP: 5.4 g/dL — ABNORMAL LOW (ref 6.0–8.5)

## 2018-07-06 LAB — KAPPA/LAMBDA LIGHT CHAINS
Kappa free light chain: 7.7 mg/L (ref 3.3–19.4)
Kappa, lambda light chain ratio: 3.08 — ABNORMAL HIGH (ref 0.26–1.65)
Lambda free light chains: 2.5 mg/L — ABNORMAL LOW (ref 5.7–26.3)

## 2018-08-02 ENCOUNTER — Encounter (HOSPITAL_COMMUNITY): Payer: Self-pay | Admitting: Hematology

## 2018-08-02 ENCOUNTER — Inpatient Hospital Stay (HOSPITAL_COMMUNITY): Payer: Medicare Other

## 2018-08-02 ENCOUNTER — Other Ambulatory Visit: Payer: Self-pay

## 2018-08-02 ENCOUNTER — Inpatient Hospital Stay (HOSPITAL_BASED_OUTPATIENT_CLINIC_OR_DEPARTMENT_OTHER): Payer: Medicare Other | Admitting: Hematology

## 2018-08-02 VITALS — BP 115/69 | HR 83 | Temp 97.9°F | Resp 16 | Wt 232.9 lb

## 2018-08-02 VITALS — BP 125/60 | HR 55 | Resp 16

## 2018-08-02 DIAGNOSIS — C9000 Multiple myeloma not having achieved remission: Secondary | ICD-10-CM | POA: Diagnosis not present

## 2018-08-02 DIAGNOSIS — G4700 Insomnia, unspecified: Secondary | ICD-10-CM

## 2018-08-02 DIAGNOSIS — Z5112 Encounter for antineoplastic immunotherapy: Secondary | ICD-10-CM | POA: Diagnosis not present

## 2018-08-02 DIAGNOSIS — R300 Dysuria: Secondary | ICD-10-CM | POA: Diagnosis not present

## 2018-08-02 DIAGNOSIS — G629 Polyneuropathy, unspecified: Secondary | ICD-10-CM | POA: Diagnosis not present

## 2018-08-02 DIAGNOSIS — M545 Low back pain: Secondary | ICD-10-CM | POA: Diagnosis not present

## 2018-08-02 LAB — COMPREHENSIVE METABOLIC PANEL
ALT: 10 U/L (ref 0–44)
AST: 15 U/L (ref 15–41)
Albumin: 3.5 g/dL (ref 3.5–5.0)
Alkaline Phosphatase: 23 U/L — ABNORMAL LOW (ref 38–126)
Anion gap: 10 (ref 5–15)
BUN: 21 mg/dL (ref 8–23)
CO2: 25 mmol/L (ref 22–32)
Calcium: 8.7 mg/dL — ABNORMAL LOW (ref 8.9–10.3)
Chloride: 107 mmol/L (ref 98–111)
Creatinine, Ser: 0.81 mg/dL (ref 0.61–1.24)
GFR calc Af Amer: >60 mL/min (ref >60)
GFR calc non Af Amer: >60 mL/min (ref >60)
Glucose, Bld: 101 mg/dL — ABNORMAL HIGH (ref 70–99)
Potassium: 4.2 mmol/L (ref 3.5–5.1)
Sodium: 142 mmol/L (ref 135–145)
Total Bilirubin: 0.6 mg/dL (ref 0.3–1.2)
Total Protein: 5.9 g/dL — ABNORMAL LOW (ref 6.5–8.1)

## 2018-08-02 LAB — CBC WITH DIFFERENTIAL/PLATELET
Abs Immature Granulocytes: 0.01 10*3/uL (ref 0.00–0.07)
Basophils Absolute: 0 10*3/uL (ref 0.0–0.1)
Basophils Relative: 0 %
Eosinophils Absolute: 0 10*3/uL (ref 0.0–0.5)
Eosinophils Relative: 1 %
HCT: 43.5 % (ref 39.0–52.0)
Hemoglobin: 14.4 g/dL (ref 13.0–17.0)
Immature Granulocytes: 0 %
Lymphocytes Relative: 22 %
Lymphs Abs: 1.3 10*3/uL (ref 0.7–4.0)
MCH: 30.1 pg (ref 26.0–34.0)
MCHC: 33.1 g/dL (ref 30.0–36.0)
MCV: 91 fL (ref 80.0–100.0)
Monocytes Absolute: 0.7 10*3/uL (ref 0.1–1.0)
Monocytes Relative: 11 %
Neutro Abs: 3.8 10*3/uL (ref 1.7–7.7)
Neutrophils Relative %: 66 %
Platelets: 141 10*3/uL — ABNORMAL LOW (ref 150–400)
RBC: 4.78 MIL/uL (ref 4.22–5.81)
RDW: 13.5 % (ref 11.5–15.5)
WBC: 5.8 10*3/uL (ref 4.0–10.5)
nRBC: 0 % (ref 0.0–0.2)

## 2018-08-02 LAB — URINALYSIS, ROUTINE W REFLEX MICROSCOPIC
Bilirubin Urine: NEGATIVE
Glucose, UA: NEGATIVE mg/dL
Hgb urine dipstick: NEGATIVE
Ketones, ur: NEGATIVE mg/dL
Nitrite: POSITIVE — AB
Protein, ur: 30 mg/dL — AB
Specific Gravity, Urine: 1.011 (ref 1.005–1.030)
WBC, UA: >50 WBC/hpf — ABNORMAL HIGH (ref 0–5)
pH: 6 (ref 5.0–8.0)

## 2018-08-02 MED ORDER — ACETAMINOPHEN 325 MG PO TABS
650.0000 mg | ORAL_TABLET | Freq: Once | ORAL | Status: AC
  Administered 2018-08-02: 650 mg via ORAL
  Filled 2018-08-02: qty 2

## 2018-08-02 MED ORDER — SODIUM CHLORIDE 0.9 % IV SOLN
15.8000 mg/kg | Freq: Once | INTRAVENOUS | Status: AC
  Administered 2018-08-02: 1800 mg via INTRAVENOUS
  Filled 2018-08-02: qty 80

## 2018-08-02 MED ORDER — SODIUM CHLORIDE 0.9 % IV SOLN
20.0000 mg | Freq: Once | INTRAVENOUS | Status: AC
  Administered 2018-08-02: 20 mg via INTRAVENOUS
  Filled 2018-08-02: qty 20

## 2018-08-02 MED ORDER — PROCHLORPERAZINE MALEATE 10 MG PO TABS
10.0000 mg | ORAL_TABLET | Freq: Once | ORAL | Status: AC
  Administered 2018-08-02: 10 mg via ORAL
  Filled 2018-08-02: qty 1

## 2018-08-02 MED ORDER — DIPHENHYDRAMINE HCL 25 MG PO CAPS
50.0000 mg | ORAL_CAPSULE | Freq: Once | ORAL | Status: AC
  Administered 2018-08-02: 50 mg via ORAL
  Filled 2018-08-02: qty 2

## 2018-08-02 MED ORDER — SODIUM CHLORIDE 0.9% FLUSH
10.0000 mL | INTRAVENOUS | Status: DC | PRN
  Administered 2018-08-02: 10 mL
  Filled 2018-08-02: qty 10

## 2018-08-02 MED ORDER — SODIUM CHLORIDE 0.9 % IV SOLN
Freq: Once | INTRAVENOUS | Status: AC
  Administered 2018-08-02: 10:00:00 via INTRAVENOUS

## 2018-08-02 MED ORDER — CIPROFLOXACIN HCL 500 MG PO TABS: 500.0000 mg | ORAL_TABLET | Freq: Two times a day (BID) | ORAL | 0 refills | Status: DC

## 2018-08-02 MED ORDER — HEPARIN SOD (PORK) LOCK FLUSH 100 UNIT/ML IV SOLN
500.0000 [IU] | Freq: Once | INTRAVENOUS | Status: AC | PRN
  Administered 2018-08-02: 500 [IU]

## 2018-08-02 MED ORDER — ZOLEDRONIC ACID 4 MG/100ML IV SOLN
4.0000 mg | Freq: Once | INTRAVENOUS | Status: AC
  Administered 2018-08-02: 4 mg via INTRAVENOUS
  Filled 2018-08-02: qty 100

## 2018-08-02 NOTE — Patient Instructions (Addendum)
[facility] at [facility] Discharge Instructions  You were seen today by Dr. Katragadda. He went over your recent lab results. He will see you back in 4 weeks for labs and follow up.   Thank you for choosing [facility] at [facility] to provide your oncology and hematology care.  To afford each patient quality time with our provider, please arrive at least 15 minutes before your scheduled appointment time.   If you have a lab appointment with the Cancer Center please come in thru the  Main Entrance and check in at the main information desk  You need to re-schedule your appointment should you arrive 10 or more minutes late.  We strive to give you quality time with our providers, and arriving late affects you and other patients whose appointments are after yours.  Also, if you no show three or more times for appointments you may be dismissed from the clinic at the providers discretion.     Again, thank you for choosing [facility].  Our hope is that these requests will decrease the amount of time that you wait before being seen by our physicians.       _____________________________________________________________  Should you have questions after your visit to [facility], please contact our office at [phone] between the hours of 8:00 a.m. and 4:30 p.m.  Voicemails left after 4:00 p.m. will not be returned until the following business day.  For prescription refill requests, have your pharmacy contact our office and allow 72 hours.    Cancer Center Support Programs:   > Cancer Support Group  2nd Tuesday of the month 1pm-2pm, Journey Room    

## 2018-08-02 NOTE — Assessment & Plan Note (Addendum)
1.  Recurrent IgG kappa multiple myeloma: - RVD from 04/07/2014 through 07/28/2014 followed by maintenance Revlimid, which was discontinued. - MRI of the brain on 02/09/2017 showed new right temporal lesion, status post radiation therapy, PET CT in January 2019 showed improvement of multiple myeloma with no new lesions, started on weekly Velcade and dexamethasone on 02/18/2017, status post 2 cycles, Velcade changed every other week on 05/06/2017 secondary to neuropathy. - MRI of the thoracic and lumbar spine on 09/03/2017 showed new rib lesion close to the spine on the left side, possibly eighth rib.  There was soft tissue mass in that area.  There is chronic L4 compression fracture which is stable. -Status post XRT to the left eighth rib lesion, 30 Gy in 10 fractions from 09/29/2017 through 10/14/2017. - DPD regimen started on 09/17/2017, developed erythematous maculopapular rash involving the trunk after taking 3 pills of pomalidomide. - Daratumumab restarted after radiation on 10/28/2017, pomalidomide completely discontinued on 02/12/2018 for recurrent rash. - We discussed multiple myeloma panel from 07/05/2018.  M spike is stable at 0.1.  Free light chain ratio is 3.08.  Kappa light chains are 7.7 and lambda light chains are 2.5.  The high free light chain ratio is secondary to low lambda light chains. - He is continuing to tolerate daratumumab every 4 weeks very well.  He will continue acyclovir twice daily. -He complained of dysuria for the last couple of weeks.  We will get a UA with culture.  He also complained of feeling a knot on the right temporal region.  I did not feel any knot on examination. -I will reevaluate him in 4 weeks.  2.  Peripheral neuropathy: -We will continue Neurontin 3 times a day.  3.  Bone protection: -He is receiving Zometa every 8 weeks.  He will continue calcium and vitamin D supplements.  4.  Low back pain: -He is taking oxycodone 10 mg 2 to 3 tablets/day. -This is from  combination of DDD and multiple myeloma.  5.  Difficulty falling asleep: - He is taking gabapentin and his Xanax which helps falling asleep.

## 2018-08-02 NOTE — Addendum Note (Signed)
Addended by: Derek Jack on: 08/02/2018 12:34 PM   Modules accepted: Orders

## 2018-08-02 NOTE — Patient Instructions (Signed)
[facility] Discharge Instructions for Patients Receiving Chemotherapy  Today you received the following chemotherapy agents   To help prevent nausea and vomiting after your treatment, we encourage you to take your nausea medication   If you develop nausea and vomiting that is not controlled by your nausea medication, call the clinic.   BELOW ARE SYMPTOMS THAT SHOULD BE REPORTED IMMEDIATELY:  *FEVER GREATER THAN 100.5 F  *CHILLS WITH OR WITHOUT FEVER  NAUSEA AND VOMITING THAT IS NOT CONTROLLED WITH YOUR NAUSEA MEDICATION  *UNUSUAL SHORTNESS OF BREATH  *UNUSUAL BRUISING OR BLEEDING  TENDERNESS IN MOUTH AND THROAT WITH OR WITHOUT PRESENCE OF ULCERS  *URINARY PROBLEMS  *BOWEL PROBLEMS  UNUSUAL RASH Items with * indicate a potential emergency and should be followed up as soon as possible.  Feel free to call the clinic should you have any questions or concerns. The clinic phone number is [phone].  Please show the CHEMO ALERT CARD at check-in to the Emergency Department and triage nurse.   

## 2018-08-02 NOTE — Progress Notes (Signed)
Labs reviewed with MD today. Proceed with treatment today.

## 2018-08-02 NOTE — Progress Notes (Signed)
[place], [place] [address]   CLINIC:  Medical Oncology/Hematology  PCP:  [name], MD [address] [place] [address] [phone]   REASON FOR VISIT:  Follow-up for  IgG kappa multiple myeloma   BRIEF ONCOLOGIC HISTORY:  Oncology History  Multiple myeloma without remission ([place])  11/16/2012 Initial Diagnosis   L occipital condyle destructive lesion, not biopsied secondary to location. XRT   12/01/2012 Imaging   L3 superior endplate compression FX, no lytic lesions to suggest myeloma   02/06/2014 Imaging   soft tissue mass along the right posterior fifth rib with some rib destruction   02/16/2014 Miscellaneous   Normal CBC, Normal CMP, kappa,lamda ratio of 2.62 (abnormal), UIEP with slightly restricted band, IgG kappa, SPEP/IEP with monoclonal protein at 0.79 g/dl, normal igG, suppressed IGM   02/20/2014 Bone Marrow Biopsy   33% kappa restricted plasma cells Normal FISH, normal cytogenetics   02/21/2014 - 03/08/2014 Radiation Therapy   30Gy to R rib lesion, lesion not biopsied   03/16/2014 PET scan   Scattered hypermetabolic osseous lesions, including the calvarium, ribs, left scapula, sacrum, and left femoral shaft. An expansile left vertebral body lesion at L3 extends into the epidural space of the left lateral recess,    03/17/2014 - 04/07/2014 Chemotherapy   Velcade and Dexamethasone due to initial denial of Revlimid by insurance company.  Zometa monthly.   03/22/2014 Imaging   MR_ L-Spine- Enhancing lesions compatible with multiple myeloma at L2, L3, and S1-2.   04/07/2014 - 07/28/2014 Chemotherapy   RVD with Revlimid at 25 mg days 1-14.  Revlimid dose reduced to 25 mg every other day x 14 days with 7 day respite beginning on day 1 of cycle 2.   04/17/2014 Adverse Reaction   Revlimid-induced rash.  Treated with steroids.   07/28/2014 - 08/04/2014 Chemotherapy   Revlimid daily   08/04/2014 Adverse Reaction   Velcade-induced peripheral neuropathy   08/04/2014 Treatment Plan Change   D/C Velcade   08/11/2014 PET scan   Response to therapy. Improvement and resolution of foci of osseous hypermetabolism.   08/22/2014 - 08/28/2014 Chemotherapy   Revlimid 25 mg days 1-21 every 28 days   08/28/2014 Adverse Reaction   Revlimid-induced rash. Medication held.  Medrol dose Pak prescribed.   09/04/2014 - 01/08/2015 Chemotherapy   Revlimid 10 mg every other day, without dexamethasone   01/08/2015 - 01/15/2015 Hospital Admission   Colonic diverticular abscess with IR drain placement   03/02/2015 PET scan   Only bony uptake is low activity at site of deformity/callus at R second rib FX, high activity in sigmoid colon, advanced sigmoid divertidulosis. Abscess not resolved?   06/04/2015 Imaging   CT nonobstructive L renal calculus, diverticulosis of descending and sigmoid colon without inflammation, moderate prostatic enlargement   07/12/2015 Surgery   Diverticulitis s/p robotic sigmoid colectomy with Dr. Johney Maine   08/23/2015 PET scan   Primarily similar hypermetabolic osseous foci within the R sided ribs. new L third rib focus of hypermetab and sclerosis is favored to be related to healing FX. no soft tissue myeloma id'd. Presumed postop hypermetab and edema about the R pelvic wall    03/06/2016 PET scan   1. Reduced activity in the prior lesion such as the right second and fifth rib lesions, activity nearly completely resolved and significantly less than added mediastinal blood pool activity. No new lesions are identified. 2. Other imaging findings of potential clinical significance: Coronary, aortic  arch, and branch vessel atherosclerotic vascular disease. Aortoiliac atherosclerotic vascular disease. Enlarged prostate gland. Colonic diverticula.   02/17/2017 PET scan   HEAD/NECK: No hypermetabolic activity in the scalp. No hypermetabolic cervical lymph nodes. CHEST: No hypermetabolic mediastinal or hilar nodes.  Right upper lobe scarring/atelectasis. No suspicious pulmonary nodules on the CT scan. ABDOMEN/PELVIS: No abnormal hypermetabolic activity within the liver, pancreas, adrenal glands, or spleen. No hypermetabolic lymph nodes in the abdomen or pelvis. Atherosclerotic calcifications of the abdominal aorta and branch vessels. Left renal sinus cysts. Sigmoid diverticulosis, without evidence of diverticulitis. Prostatomegaly. SKELETON: Vague hypermetabolism involving the inferior sternum, max SUV 3.2, previously 8.2. Focal hypermetabolism involving the left sacrum, max SUV 4.0, previously 6.7. EXTREMITIES: No abnormal hypermetabolic activity in the lower extremities.   02/18/2017 -  Chemotherapy   Bortezomib 1.'3mg'$ /m2 QWk + Dexamethasone '10mg'$  QTue/Fri --Cycle #1, 02/18/17    02/18/2017 - 08/26/2017 Chemotherapy   The patient had bortezomib SQ (VELCADE) chemo injection 3 mg, 1.3 mg/m2 = 3 mg, Subcutaneous,  Once, 7 of 9 cycles Administration: 3 mg (02/18/2017), 3 mg (02/26/2017), 3 mg (03/04/2017), 3 mg (03/11/2017), 3 mg (04/08/2017), 3 mg (03/18/2017), 3 mg (03/25/2017), 3 mg (04/01/2017), 3 mg (05/06/2017), 3 mg (05/20/2017), 3 mg (06/03/2017), 3 mg (06/17/2017), 3 mg (07/01/2017), 3 mg (07/15/2017), 3 mg (07/29/2017), 3 mg (08/12/2017), 3 mg (08/26/2017)  for chemotherapy treatment.    09/17/2017 -  Chemotherapy   The patient had daratumumab (DARZALEX) 1,800 mg in sodium chloride 0.9 % 910 mL (1.8 mg/mL) chemo infusion, 15.9 mg/kg = 1,820 mg, Intravenous, Once, 1 of 1 cycle Administration: 1,800 mg (09/17/2017) daratumumab (DARZALEX) 1,800 mg in sodium chloride 0.9 % 410 mL (3.6 mg/mL) chemo infusion, 1,820 mg, Intravenous, Once, 11 of 16 cycles Administration: 1,800 mg (10/28/2017), 1,800 mg (11/04/2017), 1,800 mg (11/11/2017), 1,800 mg (11/18/2017), 1,800 mg (11/25/2017), 1,800 mg (12/02/2017), 1,800 mg (12/09/2017), 1,800 mg (12/16/2017), 1,800 mg (12/30/2017), 1,800 mg (04/26/2018), 1,800 mg (01/13/2018), 1,800 mg (02/12/2018),  1,800 mg (02/26/2018), 1,800 mg (03/15/2018), 1,800 mg (03/29/2018), 1,800 mg (04/12/2018), 1,800 mg (05/10/2018), 1,800 mg (06/07/2018), 1,800 mg (07/05/2018)  for chemotherapy treatment.       CANCER STAGING: Cancer Staging No matching staging information was found for the patient.   INTERVAL HISTORY:  Mr. Swanner 83 y.o. male returns for follow-up of multiple myeloma.  Appetite is 25%.  Energy levels are low.  Is complaining of burning when urinating for the last 2 to 3 weeks.  He also had chills last night.  He also reportedly felt a knot on his right side of the head.  Numbness is well controlled with gabapentin.  Low back pain is slightly acting up today.  Is taking oxycodone 10 mg 2-3 times a day.  Denies any fevers or infections.  Denies any ER visits or hospitalizations.    REVIEW OF SYSTEMS:  Review of Systems  Constitutional: Positive for fatigue.  Genitourinary: Positive for dysuria.   Neurological: Positive for numbness.  Psychiatric/Behavioral: Positive for sleep disturbance.     PAST MEDICAL/SURGICAL HISTORY:  Past Medical History:  Diagnosis Date  . BPH (benign prostatic hyperplasia)   . Colonic diverticular abscess 01/08/2015  . Diverticulitis 81/82/99   complicated by abscess and required percutaneous drainage  . GERD (gastroesophageal reflux disease)   . H/O ETOH abuse   . History of chemotherapy last done jan 2017  . History of radiation therapy 01/05/13-02/10/13   45 gray to left occipital condyle region  . History of radiation therapy 12/01/16-12/10/16   Parasternal  nodule, chest- 24 Gy total delivered in 8 fractions, Left sacro-iliac, pelvis- 24 Gy total delivered in 8 fractions   . History of radiation therapy 02/19/17-02/22/17   right temporal scalp 30 Gy in 10 fractions  . Intra-abdominal abscess ([place])   . Multiple myeloma ([place]) 2015  . Neuropathy   . Radiation 02/21/14-03/08/14   right posterior chest wall area 30 gray  . Radiation 04/19/14-05/02/14   lumbar spine  25 gray  . Skull lesion    Left occipital condyle  . Wrist fracture, left    x 2   Past Surgical History:  Procedure Laterality Date  . COLONOSCOPY N/A 04/19/2015   Procedure: COLONOSCOPY;  Surgeon: Danie Binder, MD;  Location: [facility];  Service: Endoscopy;  Laterality: N/A;  1:30 PM  . CYST EXCISION  1959   tail bone  . IR IMAGING GUIDED PORT INSERTION  09/25/2017     SOCIAL HISTORY:  Social History   Socioeconomic History  . Marital status: Married    Spouse name: Not on file  . Number of children: 3  . Years of education: Not on file  . Highest education level: Not on file  Occupational History    Employer: RETIRED  Social Needs  . Financial resource strain: Not on file  . Food insecurity    Worry: Not on file    Inability: Not on file  . Transportation needs    Medical: Not on file    Non-medical: Not on file  Tobacco Use  . Smoking status: Never Smoker  . Smokeless tobacco: Never Used  Substance and Sexual Activity  . Alcohol use: No    Comment: " Not much no more"  . Drug use: No  . Sexual activity: Not on file  Lifestyle  . Physical activity    Days per week: Not on file    Minutes per session: Not on file  . Stress: Not on file  Relationships  . Social Herbalist on phone: Not on file    Gets together: Not on file    Attends religious service: Not on file    Active member of club or organization: Not on file    Attends meetings of clubs or organizations: Not on file    Relationship status: Not on file  . Intimate partner violence    Fear of current or ex partner: Not on file    Emotionally abused: Not on file    Physically abused: Not on file    Forced sexual activity: Not on file  Other Topics Concern  . Not on file  Social History Narrative  . Not on file    FAMILY HISTORY:  Family History  Problem Relation Age of Onset  . Diabetes Mother   . Stroke Mother   . Hypertension Father     CURRENT MEDICATIONS:  Outpatient  Encounter Medications as of 08/02/2018  Medication Sig Note  . acyclovir (ZOVIRAX) 400 MG tablet Take 1 tablet (400 mg total) by mouth 2 (two) times daily.   Marland Kitchen ALPRAZolam (XANAX) 1 MG tablet TAKE 2 TABLETS BY MOUTH AT BEDTIME AS NEEDED FOR ANXIETY OR SLEEP   . aspirin EC 325 MG tablet Take 1 tablet (325 mg total) by mouth daily.   Marland Kitchen CALCIUM-VITAMIN D PO Take 1 tablet by mouth 2 (two) times daily.    Marland Kitchen dexamethasone (DECADRON) 4 MG tablet Take 2.5 tablets (10 mg total) by mouth once a week. Take 10 mg once weekly  09/18/2017: Started on 09/18/17  . furosemide (LASIX) 20 MG tablet TAKE 1 TABLET BY MOUTH DAILY AS NEEDED FOR EDEMA.   Marland Kitchen gabapentin (NEURONTIN) 400 MG capsule Take 1 capsule (400 mg total) by mouth 3 (three) times daily.   Marland Kitchen lidocaine-prilocaine (EMLA) cream Apply small amount over port one (1) hour prior to appointment.   Marland Kitchen MAGNESIUM PO Take 1 tablet by mouth at bedtime.   Marland Kitchen omeprazole (PRILOSEC) 20 MG capsule TAKE 1 CAPSULE BY MOUTH ONCE DAILY   . Oxycodone HCl 10 MG TABS Take 1 tablet (10 mg total) by mouth every 8 (eight) hours as needed.   . tamsulosin (FLOMAX) 0.4 MG CAPS capsule TAKE 1 CAPSULE BY MOUTH EVERY NIGHT AT BEDTIME   . zolendronic acid (ZOMETA) 4 MG/5ML injection Inject 4 mg into the vein every 30 (thirty) days.    . temazepam (RESTORIL) 15 MG capsule TAKE 1 CAPSULE BY MOUTH AT BEDTIME AS NEEDED FOR SLEEP (Patient not taking: Reported on 07/05/2018)   . [DISCONTINUED] ciprofloxacin (CIPRO) 500 MG tablet Take 1 tablet (500 mg total) by mouth 2 (two) times daily.    No facility-administered encounter medications on file as of 08/02/2018.     ALLERGIES:  Allergies  Allergen Reactions  . Codeine Other (See Comments)    Headache  . Morphine And Related Other (See Comments)    Makes him feel weird  . Revlimid [Lenalidomide] Other (See Comments)    "Causes me to become weak"     PHYSICAL EXAM:  ECOG Performance status: 1  Vitals:   08/02/18 0930  BP: 115/69  Pulse:  83  Resp: 16  Temp: 97.9 F (36.6 C)  SpO2: 98%   Filed Weights   08/02/18 0930  Weight: 232 lb 14.4 oz (105.6 kg)    Physical Exam Vitals signs reviewed.  Constitutional:      Appearance: Normal appearance.  Cardiovascular:     Rate and Rhythm: Normal rate and regular rhythm.     Heart sounds: Normal heart sounds.  Pulmonary:     Effort: Pulmonary effort is normal.     Breath sounds: Normal breath sounds.  Abdominal:     General: There is no distension.     Palpations: Abdomen is soft. There is no mass.  Musculoskeletal:        General: No swelling.  Skin:    General: Skin is warm.  Neurological:     General: No focal deficit present.     Mental Status: He is alert and oriented to person, place, and time.  Psychiatric:        Mood and Affect: Mood normal.        Behavior: Behavior normal.      LABORATORY DATA:  I have reviewed the labs as listed.  CBC    Component Value Date/Time   WBC 5.8 08/02/2018 0859   RBC 4.78 08/02/2018 0859   HGB 14.4 08/02/2018 0859   HCT 43.5 08/02/2018 0859   PLT 141 (L) 08/02/2018 0859   MCV 91.0 08/02/2018 0859   MCH 30.1 08/02/2018 0859   MCHC 33.1 08/02/2018 0859   RDW 13.5 08/02/2018 0859   LYMPHSABS 1.3 08/02/2018 0859   MONOABS 0.7 08/02/2018 0859   EOSABS 0.0 08/02/2018 0859   BASOSABS 0.0 08/02/2018 0859   CMP Latest Ref Rng & Units 08/02/2018 07/05/2018 06/29/2018  Glucose 70 - 99 mg/dL 101(H) 109(H) 120(H)  BUN 8 - 23 mg/dL '21 23 18  '$ Creatinine 0.61 - 1.24 mg/dL 0.81 0.81  0.72  Sodium 135 - 145 mmol/L 142 141 140  Potassium 3.5 - 5.1 mmol/L 4.2 3.9 3.9  Chloride 98 - 111 mmol/L 107 104 108  CO2 22 - 32 mmol/L '25 24 23  '$ Calcium 8.9 - 10.3 mg/dL 8.7(L) 8.6(L) 8.5(L)  Total Protein 6.5 - 8.1 g/dL 5.9(L) 5.9(L) 5.7(L)  Total Bilirubin 0.3 - 1.2 mg/dL 0.6 0.5 0.5  Alkaline Phos 38 - 126 U/L 23(L) 20(L) 25(L)  AST 15 - 41 U/L '15 16 17  '$ ALT 0 - 44 U/L '10 13 14       '$ DIAGNOSTIC IMAGING:  I have independently  reviewed the scans and discussed with the patient.   I have reviewed Venita Lick LPN's note and agree with the documentation.  I personally performed a face-to-face visit, made revisions and my assessment and plan is as follows.    ASSESSMENT & PLAN:   Multiple myeloma without remission ([place]) 1.  Recurrent IgG kappa multiple myeloma: - RVD from 04/07/2014 through 07/28/2014 followed by maintenance Revlimid, which was discontinued. - MRI of the brain on 02/09/2017 showed new right temporal lesion, status post radiation therapy, PET CT in January 2019 showed improvement of multiple myeloma with no new lesions, started on weekly Velcade and dexamethasone on 02/18/2017, status post 2 cycles, Velcade changed every other week on 05/06/2017 secondary to neuropathy. - MRI of the thoracic and lumbar spine on 09/03/2017 showed new rib lesion close to the spine on the left side, possibly eighth rib.  There was soft tissue mass in that area.  There is chronic L4 compression fracture which is stable. -Status post XRT to the left eighth rib lesion, 30 Gy in 10 fractions from 09/29/2017 through 10/14/2017. - DPD regimen started on 09/17/2017, developed erythematous maculopapular rash involving the trunk after taking 3 pills of pomalidomide. - Daratumumab restarted after radiation on 10/28/2017, pomalidomide completely discontinued on 02/12/2018 for recurrent rash. - We discussed multiple myeloma panel from 07/05/2018.  M spike is stable at 0.1.  Free light chain ratio is 3.08.  Kappa light chains are 7.7 and lambda light chains are 2.5.  The high free light chain ratio is secondary to low lambda light chains. - He is continuing to tolerate daratumumab every 4 weeks very well.  He will continue acyclovir twice daily. -He complained of dysuria for the last couple of weeks.  We will get a UA with culture.  He also complained of feeling a knot on the right temporal region.  I did not feel any knot on examination. -I will  reevaluate him in 4 weeks.  2.  Peripheral neuropathy: -We will continue Neurontin 3 times a day.  3.  Bone protection: -He is receiving Zometa every 8 weeks.  He will continue calcium and vitamin D supplements.  4.  Low back pain: -He is taking oxycodone 10 mg 2 to 3 tablets/day. -This is from combination of DDD and multiple myeloma.  5.  Difficulty falling asleep: - He is taking gabapentin and his Xanax which helps falling asleep.  Total time spent is 25 minutes with more than 50% of the time spent face-to-face discussing and reinforcing treatment plan and coordination of care.    Orders placed this encounter:  Orders Placed This Encounter  Procedures  . Urine culture  . Urinalysis, Routine w reflex microscopic      Derek Jack, MD [place] [phone]

## 2018-08-04 LAB — URINE CULTURE: Culture: >=100000 — AB

## 2018-08-09 ENCOUNTER — Other Ambulatory Visit: Payer: Self-pay | Admitting: Family Medicine

## 2018-08-09 DIAGNOSIS — C9000 Multiple myeloma not having achieved remission: Secondary | ICD-10-CM

## 2018-08-09 DIAGNOSIS — G4700 Insomnia, unspecified: Secondary | ICD-10-CM

## 2018-08-09 NOTE — Telephone Encounter (Signed)
Pt is requesting refill on Xanax   LOV: 04/02/17  LRF:  07/02/18

## 2018-08-18 ENCOUNTER — Other Ambulatory Visit (HOSPITAL_COMMUNITY): Payer: Self-pay | Admitting: Hematology

## 2018-08-18 DIAGNOSIS — C9000 Multiple myeloma not having achieved remission: Secondary | ICD-10-CM

## 2018-08-30 ENCOUNTER — Other Ambulatory Visit: Payer: Self-pay

## 2018-08-30 ENCOUNTER — Inpatient Hospital Stay (HOSPITAL_COMMUNITY): Payer: Medicare Other | Attending: Hematology

## 2018-08-30 ENCOUNTER — Inpatient Hospital Stay (HOSPITAL_COMMUNITY): Payer: Medicare Other

## 2018-08-30 ENCOUNTER — Encounter (HOSPITAL_COMMUNITY): Payer: Self-pay | Admitting: Hematology

## 2018-08-30 ENCOUNTER — Other Ambulatory Visit (HOSPITAL_COMMUNITY): Payer: Self-pay | Admitting: *Deleted

## 2018-08-30 ENCOUNTER — Inpatient Hospital Stay (HOSPITAL_BASED_OUTPATIENT_CLINIC_OR_DEPARTMENT_OTHER): Payer: Medicare Other | Admitting: Hematology

## 2018-08-30 VITALS — BP 118/66 | HR 80 | Temp 98.6°F | Resp 18 | Wt 234.6 lb

## 2018-08-30 VITALS — BP 108/77 | HR 63 | Temp 96.8°F | Resp 18

## 2018-08-30 DIAGNOSIS — R51 Headache: Secondary | ICD-10-CM

## 2018-08-30 DIAGNOSIS — Z5112 Encounter for antineoplastic immunotherapy: Secondary | ICD-10-CM | POA: Diagnosis not present

## 2018-08-30 DIAGNOSIS — G4700 Insomnia, unspecified: Secondary | ICD-10-CM

## 2018-08-30 DIAGNOSIS — C9000 Multiple myeloma not having achieved remission: Secondary | ICD-10-CM

## 2018-08-30 DIAGNOSIS — Z79899 Other long term (current) drug therapy: Secondary | ICD-10-CM

## 2018-08-30 DIAGNOSIS — M545 Low back pain: Secondary | ICD-10-CM | POA: Diagnosis not present

## 2018-08-30 DIAGNOSIS — G629 Polyneuropathy, unspecified: Secondary | ICD-10-CM | POA: Diagnosis not present

## 2018-08-30 DIAGNOSIS — R519 Headache, unspecified: Secondary | ICD-10-CM

## 2018-08-30 LAB — CBC WITH DIFFERENTIAL/PLATELET
Abs Immature Granulocytes: 0.01 10*3/uL (ref 0.00–0.07)
Basophils Absolute: 0 10*3/uL (ref 0.0–0.1)
Basophils Relative: 0 %
Eosinophils Absolute: 0 10*3/uL (ref 0.0–0.5)
Eosinophils Relative: 0 %
HCT: 42.6 % (ref 39.0–52.0)
Hemoglobin: 13.9 g/dL (ref 13.0–17.0)
Immature Granulocytes: 0 %
Lymphocytes Relative: 25 %
Lymphs Abs: 1.3 10*3/uL (ref 0.7–4.0)
MCH: 29.3 pg (ref 26.0–34.0)
MCHC: 32.6 g/dL (ref 30.0–36.0)
MCV: 89.9 fL (ref 80.0–100.0)
Monocytes Absolute: 0.5 10*3/uL (ref 0.1–1.0)
Monocytes Relative: 10 %
Neutro Abs: 3.3 10*3/uL (ref 1.7–7.7)
Neutrophils Relative %: 65 %
Platelets: 124 10*3/uL — ABNORMAL LOW (ref 150–400)
RBC: 4.74 MIL/uL (ref 4.22–5.81)
RDW: 13.4 % (ref 11.5–15.5)
WBC: 5.2 10*3/uL (ref 4.0–10.5)
nRBC: 0 % (ref 0.0–0.2)

## 2018-08-30 LAB — COMPREHENSIVE METABOLIC PANEL
ALT: 11 U/L (ref 0–44)
AST: 17 U/L (ref 15–41)
Albumin: 3.4 g/dL — ABNORMAL LOW (ref 3.5–5.0)
Alkaline Phosphatase: 25 U/L — ABNORMAL LOW (ref 38–126)
Anion gap: 7 (ref 5–15)
BUN: 17 mg/dL (ref 8–23)
CO2: 25 mmol/L (ref 22–32)
Calcium: 8.8 mg/dL — ABNORMAL LOW (ref 8.9–10.3)
Chloride: 107 mmol/L (ref 98–111)
Creatinine, Ser: 0.65 mg/dL (ref 0.61–1.24)
GFR calc Af Amer: >60 mL/min (ref >60)
GFR calc non Af Amer: >60 mL/min (ref >60)
Glucose, Bld: 108 mg/dL — ABNORMAL HIGH (ref 70–99)
Potassium: 4 mmol/L (ref 3.5–5.1)
Sodium: 139 mmol/L (ref 135–145)
Total Bilirubin: 0.6 mg/dL (ref 0.3–1.2)
Total Protein: 5.6 g/dL — ABNORMAL LOW (ref 6.5–8.1)

## 2018-08-30 MED ORDER — ACETAMINOPHEN 325 MG PO TABS
650.0000 mg | ORAL_TABLET | Freq: Once | ORAL | Status: AC
  Administered 2018-08-30: 650 mg via ORAL

## 2018-08-30 MED ORDER — OXYCODONE HCL 10 MG PO TABS: 10.0000 mg | ORAL_TABLET | Freq: Three times a day (TID) | ORAL | 0 refills | Status: DC | PRN

## 2018-08-30 MED ORDER — DIPHENHYDRAMINE HCL 25 MG PO CAPS
50.0000 mg | ORAL_CAPSULE | Freq: Once | ORAL | Status: AC
  Administered 2018-08-30: 50 mg via ORAL
  Filled 2018-08-30: qty 2

## 2018-08-30 MED ORDER — SODIUM CHLORIDE 0.9% FLUSH
10.0000 mL | INTRAVENOUS | Status: DC | PRN
  Administered 2018-08-30: 10 mL
  Filled 2018-08-30: qty 10

## 2018-08-30 MED ORDER — SODIUM CHLORIDE 0.9 % IV SOLN
15.8000 mg/kg | Freq: Once | INTRAVENOUS | Status: AC
  Administered 2018-08-30: 1800 mg via INTRAVENOUS
  Filled 2018-08-30: qty 10

## 2018-08-30 MED ORDER — PROCHLORPERAZINE MALEATE 10 MG PO TABS
10.0000 mg | ORAL_TABLET | Freq: Once | ORAL | Status: AC
  Administered 2018-08-30: 10 mg via ORAL
  Filled 2018-08-30: qty 1

## 2018-08-30 MED ORDER — SODIUM CHLORIDE 0.9 % IV SOLN
Freq: Once | INTRAVENOUS | Status: AC
  Administered 2018-08-30: 11:00:00 via INTRAVENOUS

## 2018-08-30 MED ORDER — HEPARIN SOD (PORK) LOCK FLUSH 100 UNIT/ML IV SOLN
500.0000 [IU] | Freq: Once | INTRAVENOUS | Status: AC | PRN
  Administered 2018-08-30: 500 [IU]

## 2018-08-30 MED ORDER — SODIUM CHLORIDE 0.9 % IV SOLN
20.0000 mg | Freq: Once | INTRAVENOUS | Status: AC
  Administered 2018-08-30: 20 mg via INTRAVENOUS
  Filled 2018-08-30: qty 20

## 2018-08-30 NOTE — Patient Instructions (Signed)
[facility] Discharge Instructions for Patients Receiving Chemotherapy   Beginning January 23rd 2017 lab work for the Cancer Center will be done in the  Main lab at [facility] on 1st floor. If you have a lab appointment with the Cancer Center please come in thru the  Main Entrance and check in at the main information desk   Today you received the following chemotherapy agents Darzalex. Follow-up as scheduled. Call clinic for any questions or concerns  To help prevent nausea and vomiting after your treatment, we encourage you to take your nausea medication   If you develop nausea and vomiting, or diarrhea that is not controlled by your medication, call the clinic.  The clinic phone number is [phone]. Office hours are Monday-Friday 8:30am-5:00pm.  BELOW ARE SYMPTOMS THAT SHOULD BE REPORTED IMMEDIATELY:  *FEVER GREATER THAN 101.0 F  *CHILLS WITH OR WITHOUT FEVER  NAUSEA AND VOMITING THAT IS NOT CONTROLLED WITH YOUR NAUSEA MEDICATION  *UNUSUAL SHORTNESS OF BREATH  *UNUSUAL BRUISING OR BLEEDING  TENDERNESS IN MOUTH AND THROAT WITH OR WITHOUT PRESENCE OF ULCERS  *URINARY PROBLEMS  *BOWEL PROBLEMS  UNUSUAL RASH Items with * indicate a potential emergency and should be followed up as soon as possible. If you have an emergency after office hours please contact your primary care physician or go to the nearest emergency department.  Please call the clinic during office hours if you have any questions or concerns.   You may also contact the Patient Navigator at [phone] should you have any questions or need assistance in obtaining follow up care.      Resources For Cancer Patients and their Caregivers ? American Cancer Society: Can assist with transportation, wigs, general needs, runs Look Good Feel Better.        1-888-227-6333 ? Cancer Care: Provides financial assistance, online support groups, medication/co-pay assistance.  1-800-813-HOPE  (4673) ? [facility] Assists [place] cancer patients and their families through emotional , educational and financial support.  336-427-4357 ? [place] DSS Where to apply for food stamps, Medicaid and utility assistance. 336-342-1394 ? [facility]: Transportation to medical appointments. 336-347-2287 ? Social Security Administration: May apply for disability if have a Stage IV cancer. 336-342-7796 1-800-772-1213 ? [place] Aging, Disability and Transit Services: Assists with nutrition, care and transit needs. 336-349-2343         

## 2018-08-30 NOTE — Progress Notes (Signed)
B[place]ton reviewed with and pt seen by Dr. Delton Coombes today and pt approved for Darzalex infusion per MD                                                                 [name]. tolerated Darzalex infusion well without complaints or incident. VSS upon discharge. Pt discharged via wheelchair in satisfactory condition

## 2018-08-30 NOTE — Progress Notes (Signed)
Dean [name], [place] [address]   CLINIC:  Medical Oncology/Hematology  PCP:  [name], MD [address] [place] [address] [phone]   REASON FOR VISIT:  Follow-up for  IgG kappa multiple myeloma   BRIEF ONCOLOGIC HISTORY:  Oncology History  Multiple myeloma without remission ([place])  11/16/2012 Initial Diagnosis   L occipital condyle destructive lesion, not biopsied secondary to location. XRT   12/01/2012 Imaging   L3 superior endplate compression FX, no lytic lesions to suggest myeloma   02/06/2014 Imaging   soft tissue mass along the right posterior fifth rib with some rib destruction   02/16/2014 Miscellaneous   Normal CBC, Normal CMP, kappa,lamda ratio of 2.62 (abnormal), UIEP with slightly restricted band, IgG kappa, SPEP/IEP with monoclonal protein at 0.79 g/dl, normal igG, suppressed IGM   02/20/2014 Bone Marrow Biopsy   33% kappa restricted plasma cells Normal FISH, normal cytogenetics   02/21/2014 - 03/08/2014 Radiation Therapy   30Gy to R rib lesion, lesion not biopsied   03/16/2014 PET scan   Scattered hypermetabolic osseous lesions, including the calvarium, ribs, left scapula, sacrum, and left femoral shaft. An expansile left vertebral body lesion at L3 extends into the epidural space of the left lateral recess,    03/17/2014 - 04/07/2014 Chemotherapy   Velcade and Dexamethasone due to initial denial of Revlimid by insurance company.  Zometa monthly.   03/22/2014 Imaging   MR_ L-Spine- Enhancing lesions compatible with multiple myeloma at L2, L3, and S1-2.   04/07/2014 - 07/28/2014 Chemotherapy   RVD with Revlimid at 25 mg days 1-14.  Revlimid dose reduced to 25 mg every other day x 14 days with 7 day respite beginning on day 1 of cycle 2.   04/17/2014 Adverse Reaction   Revlimid-induced rash.  Treated with steroids.   07/28/2014 - 08/04/2014 Chemotherapy   Revlimid daily   08/04/2014 Adverse Reaction   Velcade-induced peripheral neuropathy   08/04/2014 Treatment Plan Change   D/C Velcade   08/11/2014 PET scan   Response to therapy. Improvement and resolution of foci of osseous hypermetabolism.   08/22/2014 - 08/28/2014 Chemotherapy   Revlimid 25 mg days 1-21 every 28 days   08/28/2014 Adverse Reaction   Revlimid-induced rash. Medication held.  Medrol dose Pak prescribed.   09/04/2014 - 01/08/2015 Chemotherapy   Revlimid 10 mg every other day, without dexamethasone   01/08/2015 - 01/15/2015 Hospital Admission   Colonic diverticular abscess with IR drain placement   03/02/2015 PET scan   Only bony uptake is low activity at site of deformity/callus at R second rib FX, high activity in sigmoid colon, advanced sigmoid divertidulosis. Abscess not resolved?   06/04/2015 Imaging   CT nonobstructive L renal calculus, diverticulosis of descending and sigmoid colon without inflammation, moderate prostatic enlargement   07/12/2015 Surgery   Diverticulitis s/p robotic sigmoid colectomy with Dr. Johney Maine   08/23/2015 PET scan   Primarily similar hypermetabolic osseous foci within the R sided ribs. new L third rib focus of hypermetab and sclerosis is favored to be related to healing FX. no soft tissue myeloma id'd. Presumed postop hypermetab and edema about the R pelvic wall    03/06/2016 PET scan   1. Reduced activity in the prior lesion such as the right second and fifth rib lesions, activity nearly completely resolved and significantly less than added mediastinal blood pool activity. No new lesions are identified. 2. Other imaging findings of potential clinical significance: Coronary, aortic  arch, and branch vessel atherosclerotic vascular disease. Aortoiliac atherosclerotic vascular disease. Enlarged prostate gland. Colonic diverticula.   02/17/2017 PET scan   HEAD/NECK: No hypermetabolic activity in the scalp. No hypermetabolic cervical lymph nodes. CHEST: No hypermetabolic mediastinal or hilar nodes.  Right upper lobe scarring/atelectasis. No suspicious pulmonary nodules on the CT scan. ABDOMEN/PELVIS: No abnormal hypermetabolic activity within the liver, pancreas, adrenal glands, or spleen. No hypermetabolic lymph nodes in the abdomen or pelvis. Atherosclerotic calcifications of the abdominal aorta and branch vessels. Left renal sinus cysts. Sigmoid diverticulosis, without evidence of diverticulitis. Prostatomegaly. SKELETON: Vague hypermetabolism involving the inferior sternum, max SUV 3.2, previously 8.2. Focal hypermetabolism involving the left sacrum, max SUV 4.0, previously 6.7. EXTREMITIES: No abnormal hypermetabolic activity in the lower extremities.   02/18/2017 -  Chemotherapy   Bortezomib 1.'3mg'$ /m2 QWk + Dexamethasone '10mg'$  QTue/Fri --Cycle #1, 02/18/17    02/18/2017 - 08/26/2017 Chemotherapy   The patient had bortezomib SQ (VELCADE) chemo injection 3 mg, 1.3 mg/m2 = 3 mg, Subcutaneous,  Once, 7 of 9 cycles Administration: 3 mg (02/18/2017), 3 mg (02/26/2017), 3 mg (03/04/2017), 3 mg (03/11/2017), 3 mg (04/08/2017), 3 mg (03/18/2017), 3 mg (03/25/2017), 3 mg (04/01/2017), 3 mg (05/06/2017), 3 mg (05/20/2017), 3 mg (06/03/2017), 3 mg (06/17/2017), 3 mg (07/01/2017), 3 mg (07/15/2017), 3 mg (07/29/2017), 3 mg (08/12/2017), 3 mg (08/26/2017)  for chemotherapy treatment.    09/17/2017 -  Chemotherapy   The patient had daratumumab (DARZALEX) 1,800 mg in sodium chloride 0.9 % 910 mL (1.8 mg/mL) chemo infusion, 15.9 mg/kg = 1,820 mg, Intravenous, Once, 1 of 1 cycle Administration: 1,800 mg (09/17/2017) daratumumab (DARZALEX) 1,800 mg in sodium chloride 0.9 % 410 mL (3.6 mg/mL) chemo infusion, 1,820 mg, Intravenous, Once, 12 of 16 cycles Administration: 1,800 mg (10/28/2017), 1,800 mg (11/04/2017), 1,800 mg (11/11/2017), 1,800 mg (11/18/2017), 1,800 mg (11/25/2017), 1,800 mg (12/02/2017), 1,800 mg (12/09/2017), 1,800 mg (12/16/2017), 1,800 mg (12/30/2017), 1,800 mg (04/26/2018), 1,800 mg (01/13/2018), 1,800 mg (02/12/2018),  1,800 mg (02/26/2018), 1,800 mg (03/15/2018), 1,800 mg (03/29/2018), 1,800 mg (04/12/2018), 1,800 mg (05/10/2018), 1,800 mg (06/07/2018), 1,800 mg (07/05/2018), 1,800 mg (08/02/2018), 1,800 mg (08/30/2018)  for chemotherapy treatment.       CANCER STAGING: Cancer Staging No matching staging information was found for the patient.   INTERVAL HISTORY:  Mr. Sainz 83 y.o. male seen for follow-up of multiple myeloma.  Appetite and energy levels are 25%.  Complains of right temporal headache in the last 3 weeks.  This is mostly at nighttime but can also happen at daytime.  Constipation has been stable.  Denies any fevers or night sweats.  Does report decreased urination.  Denies any nausea, vomiting or diarrhea.    REVIEW OF SYSTEMS:  Review of Systems  Gastrointestinal: Positive for constipation.  Neurological: Positive for headaches and numbness.  All other systems reviewed and are negative.    PAST MEDICAL/SURGICAL HISTORY:  Past Medical History:  Diagnosis Date  . BPH (benign prostatic hyperplasia)   . Colonic diverticular abscess 01/08/2015  . Diverticulitis 62/83/15   complicated by abscess and required percutaneous drainage  . GERD (gastroesophageal reflux disease)   . H/O ETOH abuse   . History of chemotherapy last done jan 2017  . History of radiation therapy 01/05/13-02/10/13   45 gray to left occipital condyle region  . History of radiation therapy 12/01/16-12/10/16   Parasternal nodule, chest- 24 Gy total delivered in 8 fractions, Left sacro-iliac, pelvis- 24 Gy total delivered in 8 fractions   . History of radiation therapy  02/19/17-02/22/17   right temporal scalp 30 Gy in 10 fractions  . Intra-abdominal abscess ([place])   . Multiple myeloma ([place]) 2015  . Neuropathy   . Radiation 02/21/14-03/08/14   right posterior chest wall area 30 gray  . Radiation 04/19/14-05/02/14   lumbar spine 25 gray  . Skull lesion    Left occipital condyle  . Wrist fracture, left    x 2   Past Surgical  History:  Procedure Laterality Date  . COLONOSCOPY N/A 04/19/2015   Procedure: COLONOSCOPY;  Surgeon: Danie Binder, MD;  Location: [facility];  Service: Endoscopy;  Laterality: N/A;  1:30 PM  . CYST EXCISION  1959   tail bone  . IR IMAGING GUIDED PORT INSERTION  09/25/2017     SOCIAL HISTORY:  Social History   Socioeconomic History  . Marital status: Married    Spouse name: Not on file  . Number of children: 3  . Years of education: Not on file  . Highest education level: Not on file  Occupational History    Employer: RETIRED  Social Needs  . Financial resource strain: Not on file  . Food insecurity    Worry: Not on file    Inability: Not on file  . Transportation needs    Medical: Not on file    Non-medical: Not on file  Tobacco Use  . Smoking status: Never Smoker  . Smokeless tobacco: Never Used  Substance and Sexual Activity  . Alcohol use: No    Comment: " Not much no more"  . Drug use: No  . Sexual activity: Not on file  Lifestyle  . Physical activity    Days per week: Not on file    Minutes per session: Not on file  . Stress: Not on file  Relationships  . Social Herbalist on phone: Not on file    Gets together: Not on file    Attends religious service: Not on file    Active member of club or organization: Not on file    Attends meetings of clubs or organizations: Not on file    Relationship status: Not on file  . Intimate partner violence    Fear of current or ex partner: Not on file    Emotionally abused: Not on file    Physically abused: Not on file    Forced sexual activity: Not on file  Other Topics Concern  . Not on file  Social History Narrative  . Not on file    FAMILY HISTORY:  Family History  Problem Relation Age of Onset  . Diabetes Mother   . Stroke Mother   . Hypertension Father     CURRENT MEDICATIONS:  Outpatient Encounter Medications as of 08/30/2018  Medication Sig Note  . acyclovir (ZOVIRAX) 400 MG tablet Take  1 tablet (400 mg total) by mouth 2 (two) times daily.   Marland Kitchen ALPRAZolam (XANAX) 1 MG tablet TAKE 2 TABELETS BY MOUTH AT BEDTIME AS NEEDED FOR ANXIETY OR SLEEP   . aspirin EC 325 MG tablet Take 1 tablet (325 mg total) by mouth daily.   Marland Kitchen CALCIUM-VITAMIN D PO Take 1 tablet by mouth 2 (two) times daily.    Marland Kitchen dexamethasone (DECADRON) 4 MG tablet Take 2.5 tablets (10 mg total) by mouth once a week. Take 10 mg once weekly 09/18/2017: Started on 09/18/17  . furosemide (LASIX) 20 MG tablet TAKE 1 TABLET BY MOUTH DAILY AS NEEDED FOR EDEMA.   Marland Kitchen gabapentin (NEURONTIN)  400 MG capsule Take 1 capsule (400 mg total) by mouth 3 (three) times daily.   Marland Kitchen lidocaine-prilocaine (EMLA) cream Apply small amount over port one (1) hour prior to appointment.   Marland Kitchen MAGNESIUM PO Take 1 tablet by mouth at bedtime.   Marland Kitchen omeprazole (PRILOSEC) 20 MG capsule TAKE 1 CAPSULE BY MOUTH ONCE DAILY   . tamsulosin (FLOMAX) 0.4 MG CAPS capsule TAKE 1 CAPSULE BY MOUTH EVERY NIGHT AT BEDTIME   . temazepam (RESTORIL) 15 MG capsule TAKE 1 CAPSULE BY MOUTH AT BEDTIME AS NEEDED FOR SLEEP   . zolendronic acid (ZOMETA) 4 MG/5ML injection Inject 4 mg into the vein every 30 (thirty) days.    . [DISCONTINUED] ciprofloxacin (CIPRO) 500 MG tablet Take 1 tablet (500 mg total) by mouth 2 (two) times daily.   . [DISCONTINUED] Oxycodone HCl 10 MG TABS TAKE 1 TABLET BY MOUTH EVERY 8 HOURS AS NEEDED FOR PAIN    No facility-administered encounter medications on file as of 08/30/2018.     ALLERGIES:  Allergies  Allergen Reactions  . Codeine Other (See Comments)    Headache  . Morphine And Related Other (See Comments)    Makes him feel weird  . Revlimid [Lenalidomide] Other (See Comments)    "Causes me to become weak"     PHYSICAL EXAM:  ECOG Performance status: 1  Vitals:   08/30/18 0919  BP: 118/66  Pulse: 80  Resp: 18  Temp: 98.6 F (37 C)  SpO2: 97%   Filed Weights   08/30/18 0919  Weight: 234 lb 9.6 oz (106.4 kg)    Physical Exam  Vitals signs reviewed.  Constitutional:      Appearance: Normal appearance.  Cardiovascular:     Rate and Rhythm: Normal rate and regular rhythm.     Heart sounds: Normal heart sounds.  Pulmonary:     Effort: Pulmonary effort is normal.     Breath sounds: Normal breath sounds.  Abdominal:     General: There is no distension.     Palpations: Abdomen is soft. There is no mass.  Musculoskeletal:        General: No swelling.  Skin:    General: Skin is warm.  Neurological:     General: No focal deficit present.     Mental Status: He is alert and oriented to person, place, and time.  Psychiatric:        Mood and Affect: Mood normal.        Behavior: Behavior normal.      LABORATORY DATA:  I have reviewed the labs as listed.  CBC    Component Value Date/Time   WBC 5.2 08/30/2018 0916   RBC 4.74 08/30/2018 0916   HGB 13.9 08/30/2018 0916   HCT 42.6 08/30/2018 0916   PLT 124 (L) 08/30/2018 0916   MCV 89.9 08/30/2018 0916   MCH 29.3 08/30/2018 0916   MCHC 32.6 08/30/2018 0916   RDW 13.4 08/30/2018 0916   LYMPHSABS 1.3 08/30/2018 0916   MONOABS 0.5 08/30/2018 0916   EOSABS 0.0 08/30/2018 0916   BASOSABS 0.0 08/30/2018 0916   CMP Latest Ref Rng & Units 08/30/2018 08/02/2018 07/05/2018  Glucose 70 - 99 mg/dL 108(H) 101(H) 109(H)  BUN 8 - 23 mg/dL '17 21 23  '$ Creatinine 0.61 - 1.24 mg/dL 0.65 0.81 0.81  Sodium 135 - 145 mmol/L 139 142 141  Potassium 3.5 - 5.1 mmol/L 4.0 4.2 3.9  Chloride 98 - 111 mmol/L 107 107 104  CO2 22 - 32  mmol/L '25 25 24  '$ Calcium 8.9 - 10.3 mg/dL 8.8(L) 8.7(L) 8.6(L)  Total Protein 6.5 - 8.1 g/dL 5.6(L) 5.9(L) 5.9(L)  Total Bilirubin 0.3 - 1.2 mg/dL 0.6 0.6 0.5  Alkaline Phos 38 - 126 U/L 25(L) 23(L) 20(L)  AST 15 - 41 U/L '17 15 16  '$ ALT 0 - 44 U/L '11 10 13       '$ DIAGNOSTIC IMAGING:  I have independently reviewed the scans and discussed with the patient.   I have reviewed Venita Lick LPN's note and agree with the documentation.  I personally  performed a face-to-face visit, made revisions and my assessment and plan is as follows.    ASSESSMENT & PLAN:   Multiple myeloma without remission ([place]) 1.  Recurrent IgG kappa multiple myeloma: - RVD from 04/07/2014 through 07/28/2014 followed by maintenance Revlimid, which was discontinued. - MRI of the brain on 02/09/2017 showed new right temporal lesion, status post radiation therapy, PET CT in January 2019 showed improvement of multiple myeloma with no new lesions, started on weekly Velcade and dexamethasone on 02/18/2017, status post 2 cycles, Velcade changed every other week on 05/06/2017 secondary to neuropathy. - MRI of the thoracic and lumbar spine on 09/03/2017 showed new rib lesion close to the spine on the left side, possibly eighth rib.  There was soft tissue mass in that area.  There is chronic L4 compression fracture which is stable. -Status post XRT to the left eighth rib lesion, 30 Gy in 10 fractions from 09/29/2017 through 10/14/2017. - DPD regimen started on 09/17/2017, developed erythematous maculopapular rash involving the trunk after taking 3 pills of pomalidomide. - Daratumumab restarted after radiation on 10/28/2017, pomalidomide completely discontinued on 02/12/2018 for recurrent rash. - Myeloma panel on 07/05/2018 shows M spike of 0.1 g/dL.  Free light chain ratio was 3.08.  - He is continuing to tolerate daratumumab every 4 weeks very well.  He is continuing acyclovir twice daily. - He is complaining of right temporal headache.  He also reportedly felt a knot.  I could not feel any knots.  I have recommended MRI of the brain with and without gadolinium as he has history of myeloma with temporal lesion. -He was treated for E. coli UTI on 08/02/2018. -I will see him back after the scan.  He will proceed with his daratumumab today.  2.  Peripheral neuropathy: -We will continue gabapentin 3 times daily.  3.  Bone protection: -He is receiving Zometa every 8 weeks.  Last Zometa was on  08/02/2018.  He had some recent dental fillings done.  He will continue calcium and vitamin D supplements.  4.  Low back pain: -He is taking oxycodone 10 mg 2 to 3 tablets/day. -This is from combination of DDD and multiple myeloma.  5.  Difficulty falling asleep: - He is taking gabapentin and his Xanax which helps falling asleep.  Total time spent is 25 minutes with more than 50% of the time spent face-to-face discussing and reinforcing treatment plan and coordination of care.    Orders placed this encounter:  Orders Placed This Encounter  Procedures  . MR Brain [place], [place] [phone]

## 2018-08-30 NOTE — Assessment & Plan Note (Signed)
1.  Recurrent IgG kappa multiple myeloma: - RVD from 04/07/2014 through 07/28/2014 followed by maintenance Revlimid, which was discontinued. - MRI of the brain on 02/09/2017 showed new right temporal lesion, status post radiation therapy, PET CT in January 2019 showed improvement of multiple myeloma with no new lesions, started on weekly Velcade and dexamethasone on 02/18/2017, status post 2 cycles, Velcade changed every other week on 05/06/2017 secondary to neuropathy. - MRI of the thoracic and lumbar spine on 09/03/2017 showed new rib lesion close to the spine on the left side, possibly eighth rib.  There was soft tissue mass in that area.  There is chronic L4 compression fracture which is stable. -Status post XRT to the left eighth rib lesion, 30 Gy in 10 fractions from 09/29/2017 through 10/14/2017. - DPD regimen started on 09/17/2017, developed erythematous maculopapular rash involving the trunk after taking 3 pills of pomalidomide. - Daratumumab restarted after radiation on 10/28/2017, pomalidomide completely discontinued on 02/12/2018 for recurrent rash. - Myeloma panel on 07/05/2018 shows M spike of 0.1 g/dL.  Free light chain ratio was 3.08.  - He is continuing to tolerate daratumumab every 4 weeks very well.  He is continuing acyclovir twice daily. - He is complaining of right temporal headache.  He also reportedly felt a knot.  I could not feel any knots.  I have recommended MRI of the brain with and without gadolinium as he has history of myeloma with temporal lesion. -He was treated for E. coli UTI on 08/02/2018. -I will see him back after the scan.  He will proceed with his daratumumab today.  2.  Peripheral neuropathy: -We will continue gabapentin 3 times daily.  3.  Bone protection: -He is receiving Zometa every 8 weeks.  Last Zometa was on 08/02/2018.  He had some recent dental fillings done.  He will continue calcium and vitamin D supplements.  4.  Low back pain: -He is taking oxycodone 10 mg 2 to  3 tablets/day. -This is from combination of DDD and multiple myeloma.  5.  Difficulty falling asleep: - He is taking gabapentin and his Xanax which helps falling asleep.

## 2018-08-30 NOTE — Patient Instructions (Signed)
[facility] at [facility] Discharge Instructions  You were seen today by Dr. Katragadda. He went over your recent lab results. He will see you back after your scan for follow up.   Thank you for choosing [facility] at [facility] to provide your oncology and hematology care.  To afford each patient quality time with our provider, please arrive at least 15 minutes before your scheduled appointment time.   If you have a lab appointment with the Cancer Center please come in thru the  Main Entrance and check in at the main information desk  You need to re-schedule your appointment should you arrive 10 or more minutes late.  We strive to give you quality time with our providers, and arriving late affects you and other patients whose appointments are after yours.  Also, if you no show three or more times for appointments you may be dismissed from the clinic at the providers discretion.     Again, thank you for choosing [facility].  Our hope is that these requests will decrease the amount of time that you wait before being seen by our physicians.       _____________________________________________________________  Should you have questions after your visit to  Cancer Center, please contact our office at [phone] between the hours of 8:00 a.m. and 4:30 p.m.  Voicemails left after 4:00 p.m. will not be returned until the following business day.  For prescription refill requests, have your pharmacy contact our office and allow 72 hours.    Cancer Center Support Programs:   > Cancer Support Group  2nd Tuesday of the month 1pm-2pm, Journey Room    

## 2018-09-06 ENCOUNTER — Ambulatory Visit (HOSPITAL_COMMUNITY)
Admission: RE | Admit: 2018-09-06 | Discharge: 2018-09-06 | Disposition: A | Discharge status: Home / Self Care | Payer: Medicare Other | Source: Ambulatory Visit | Attending: Hematology | Admitting: Hematology

## 2018-09-06 ENCOUNTER — Other Ambulatory Visit: Payer: Self-pay

## 2018-09-06 DIAGNOSIS — R519 Headache, unspecified: Secondary | ICD-10-CM

## 2018-09-06 DIAGNOSIS — C9000 Multiple myeloma not having achieved remission: Secondary | ICD-10-CM | POA: Insufficient documentation

## 2018-09-06 DIAGNOSIS — R51 Headache: Secondary | ICD-10-CM | POA: Diagnosis not present

## 2018-09-06 MED ORDER — GADOBUTROL 1 MMOL/ML IV SOLN
10.0000 mL | Freq: Once | INTRAVENOUS | Status: AC | PRN
  Administered 2018-09-06: 10 mL via INTRAVENOUS

## 2018-09-07 ENCOUNTER — Ambulatory Visit (HOSPITAL_COMMUNITY): Payer: Medicare Other | Admitting: Hematology

## 2018-09-23 ENCOUNTER — Other Ambulatory Visit (HOSPITAL_COMMUNITY): Payer: Self-pay

## 2018-09-23 DIAGNOSIS — C9000 Multiple myeloma not having achieved remission: Secondary | ICD-10-CM

## 2018-09-27 ENCOUNTER — Inpatient Hospital Stay (HOSPITAL_COMMUNITY): Payer: Medicare Other | Attending: Hematology

## 2018-09-27 ENCOUNTER — Other Ambulatory Visit: Payer: Self-pay

## 2018-09-27 ENCOUNTER — Encounter (HOSPITAL_COMMUNITY): Payer: Self-pay | Admitting: Hematology

## 2018-09-27 ENCOUNTER — Inpatient Hospital Stay (HOSPITAL_BASED_OUTPATIENT_CLINIC_OR_DEPARTMENT_OTHER): Payer: Medicare Other | Admitting: Hematology

## 2018-09-27 ENCOUNTER — Inpatient Hospital Stay (HOSPITAL_COMMUNITY): Payer: Medicare Other

## 2018-09-27 VITALS — BP 129/55 | HR 65 | Temp 97.5°F | Resp 18 | Wt 240.2 lb

## 2018-09-27 VITALS — BP 131/57 | HR 68 | Temp 97.8°F | Resp 18

## 2018-09-27 DIAGNOSIS — Z5112 Encounter for antineoplastic immunotherapy: Secondary | ICD-10-CM | POA: Diagnosis not present

## 2018-09-27 DIAGNOSIS — C9000 Multiple myeloma not having achieved remission: Secondary | ICD-10-CM

## 2018-09-27 LAB — COMPREHENSIVE METABOLIC PANEL
ALT: 12 U/L (ref 0–44)
AST: 16 U/L (ref 15–41)
Albumin: 3.2 g/dL — ABNORMAL LOW (ref 3.5–5.0)
Alkaline Phosphatase: 24 U/L — ABNORMAL LOW (ref 38–126)
Anion gap: 5 (ref 5–15)
BUN: 16 mg/dL (ref 8–23)
CO2: 26 mmol/L (ref 22–32)
Calcium: 8.3 mg/dL — ABNORMAL LOW (ref 8.9–10.3)
Chloride: 109 mmol/L (ref 98–111)
Creatinine, Ser: 0.76 mg/dL (ref 0.61–1.24)
GFR calc Af Amer: >60 mL/min (ref >60)
GFR calc non Af Amer: >60 mL/min (ref >60)
Glucose, Bld: 96 mg/dL (ref 70–99)
Potassium: 4.2 mmol/L (ref 3.5–5.1)
Sodium: 140 mmol/L (ref 135–145)
Total Bilirubin: 0.6 mg/dL (ref 0.3–1.2)
Total Protein: 5.4 g/dL — ABNORMAL LOW (ref 6.5–8.1)

## 2018-09-27 LAB — CBC WITH DIFFERENTIAL/PLATELET
Abs Immature Granulocytes: 0.02 10*3/uL (ref 0.00–0.07)
Basophils Absolute: 0 10*3/uL (ref 0.0–0.1)
Basophils Relative: 0 %
Eosinophils Absolute: 0 10*3/uL (ref 0.0–0.5)
Eosinophils Relative: 1 %
HCT: 42.2 % (ref 39.0–52.0)
Hemoglobin: 13.6 g/dL (ref 13.0–17.0)
Immature Granulocytes: 0 %
Lymphocytes Relative: 23 %
Lymphs Abs: 1.2 10*3/uL (ref 0.7–4.0)
MCH: 29.8 pg (ref 26.0–34.0)
MCHC: 32.2 g/dL (ref 30.0–36.0)
MCV: 92.5 fL (ref 80.0–100.0)
Monocytes Absolute: 0.6 10*3/uL (ref 0.1–1.0)
Monocytes Relative: 11 %
Neutro Abs: 3.5 10*3/uL (ref 1.7–7.7)
Neutrophils Relative %: 65 %
Platelets: 131 10*3/uL — ABNORMAL LOW (ref 150–400)
RBC: 4.56 MIL/uL (ref 4.22–5.81)
RDW: 13.4 % (ref 11.5–15.5)
WBC: 5.4 10*3/uL (ref 4.0–10.5)
nRBC: 0 % (ref 0.0–0.2)

## 2018-09-27 LAB — LACTATE DEHYDROGENASE: LDH: 111 U/L (ref 98–192)

## 2018-09-27 MED ORDER — ZOLPIDEM TARTRATE 10 MG PO TABS: 10.0000 mg | ORAL_TABLET | Freq: Every evening | ORAL | 0 refills | Status: DC | PRN

## 2018-09-27 MED ORDER — SODIUM CHLORIDE 0.9 % IV SOLN
20.0000 mg | Freq: Once | INTRAVENOUS | Status: AC
  Administered 2018-09-27: 20 mg via INTRAVENOUS
  Filled 2018-09-27: qty 20

## 2018-09-27 MED ORDER — DIPHENHYDRAMINE HCL 25 MG PO CAPS
50.0000 mg | ORAL_CAPSULE | Freq: Once | ORAL | Status: AC
  Administered 2018-09-27: 50 mg via ORAL
  Filled 2018-09-27: qty 2

## 2018-09-27 MED ORDER — SODIUM CHLORIDE 0.9 % IV SOLN
Freq: Once | INTRAVENOUS | Status: AC
  Administered 2018-09-27: 10:00:00 via INTRAVENOUS

## 2018-09-27 MED ORDER — SODIUM CHLORIDE 0.9 % IV SOLN
15.8000 mg/kg | Freq: Once | INTRAVENOUS | Status: AC
  Administered 2018-09-27: 1800 mg via INTRAVENOUS
  Filled 2018-09-27: qty 10

## 2018-09-27 MED ORDER — HEPARIN SOD (PORK) LOCK FLUSH 100 UNIT/ML IV SOLN
500.0000 [IU] | Freq: Once | INTRAVENOUS | Status: AC | PRN
  Administered 2018-09-27: 500 [IU]

## 2018-09-27 MED ORDER — ACETAMINOPHEN 325 MG PO TABS
650.0000 mg | ORAL_TABLET | Freq: Once | ORAL | Status: AC
  Administered 2018-09-27: 650 mg via ORAL
  Filled 2018-09-27: qty 2

## 2018-09-27 MED ORDER — SODIUM CHLORIDE 0.9% FLUSH
10.0000 mL | INTRAVENOUS | Status: DC | PRN
  Administered 2018-09-27: 10 mL
  Filled 2018-09-27: qty 10

## 2018-09-27 MED ORDER — PROCHLORPERAZINE MALEATE 10 MG PO TABS
10.0000 mg | ORAL_TABLET | Freq: Once | ORAL | Status: AC
  Administered 2018-09-27: 10 mg via ORAL
  Filled 2018-09-27: qty 1

## 2018-09-27 MED ORDER — ZOLEDRONIC ACID 4 MG/100ML IV SOLN
4.0000 mg | Freq: Once | INTRAVENOUS | Status: AC
  Administered 2018-09-27: 4 mg via INTRAVENOUS
  Filled 2018-09-27: qty 100

## 2018-09-27 NOTE — Assessment & Plan Note (Signed)
1.  Recurrent IgG kappa multiple myeloma: - RVD from 04/07/2014 through 07/28/2014 followed by maintenance Revlimid, which was discontinued. - MRI of the brain on 02/09/2017 showed new right temporal lesion, status post radiation therapy, PET CT in January 2019 showed improvement of multiple myeloma with no new lesions, started on weekly Velcade and dexamethasone on 02/18/2017, status post 2 cycles, Velcade changed every other week on 05/06/2017 secondary to neuropathy. - MRI of the thoracic and lumbar spine on 09/03/2017 showed new rib lesion close to the spine on the left side, possibly eighth rib.  There was soft tissue mass in that area.  There is chronic L4 compression fracture which is stable. -Status post XRT to the left eighth rib lesion, 30 Gy in 10 fractions from 09/29/2017 through 10/14/2017. - DPD regimen started on 09/17/2017, developed erythematous maculopapular rash involving the trunk after taking 3 pills of pomalidomide. - Daratumumab restarted after radiation on 10/28/2017, pomalidomide completely discontinued on 02/12/2018 for recurrent rash. - Myeloma panel on 07/05/2018 shows M spike of 0.1 g/dL.  Free light chain ratio was 3.08.  - He is continuing daratumumab every 4 weeks very well.  He is also taking Decadron 10 mg weekly. - We reviewed results of the MRI of the brain which did not show any evidence of myeloma. - We talked about the option of subcu daratumumab.  We will consider switching it at next time. -We reviewed his labs.  We sent myeloma panel today which will follow.  He may proceed with his daratumumab today.  We will see him back in 4 weeks.  2.  Peripheral neuropathy: -We will continue gabapentin 3 times daily.  3.  Bone protection: -He is receiving Zometa every 8 weeks.  His Zometa was held last time because of dental fillings.  He can receive it today.  4.  Low back pain: -He is taking oxycodone 10 mg 2 to 3 tablets/day. -This is from combination of DDD and multiple  myeloma.  5.  Difficulty falling asleep: - He has used Restoril which did not help.  We will send him prescription for Ambien.

## 2018-09-27 NOTE — Progress Notes (Signed)
Patient seen by Dr. Delton Coombes with lab review.  Ok to treat today verbal order Dr. Delton Coombes.    Patient tolerated chemotherapy with no complaints voiced.  Port site clean and dry with no bruising or swelling noted at site.  Good blood return noted before and after administration of chemotherapy.  Band aid applied.  Patient left by wheelchair with VSS and no s/s of distress noted.

## 2018-09-27 NOTE — Patient Instructions (Signed)
[facility] at [facility] Discharge Instructions  You were seen today by Dr. Katragadda. He went over your recent lab results. He will see you back in 4 weeks for labs and follow up.   Thank you for choosing [facility] at [facility] to provide your oncology and hematology care.  To afford each patient quality time with our provider, please arrive at least 15 minutes before your scheduled appointment time.   If you have a lab appointment with the Cancer Center please come in thru the  Main Entrance and check in at the main information desk  You need to re-schedule your appointment should you arrive 10 or more minutes late.  We strive to give you quality time with our providers, and arriving late affects you and other patients whose appointments are after yours.  Also, if you no show three or more times for appointments you may be dismissed from the clinic at the providers discretion.     Again, thank you for choosing [facility].  Our hope is that these requests will decrease the amount of time that you wait before being seen by our physicians.       _____________________________________________________________  Should you have questions after your visit to [facility], please contact our office at [phone] between the hours of 8:00 a.m. and 4:30 p.m.  Voicemails left after 4:00 p.m. will not be returned until the following business day.  For prescription refill requests, have your pharmacy contact our office and allow 72 hours.    Cancer Center Support Programs:   > Cancer Support Group  2nd Tuesday of the month 1pm-2pm, Journey Room    

## 2018-09-27 NOTE — Progress Notes (Signed)
[name], [place] [address]   CLINIC:  Medical Oncology/Hematology  PCP:  [name], MD [address] [place] [address] [phone]   REASON FOR VISIT:  Follow-up for  IgG kappa multiple myeloma   BRIEF ONCOLOGIC HISTORY:  Oncology History  Multiple myeloma without remission ([place])  11/16/2012 Initial Diagnosis   L occipital condyle destructive lesion, not biopsied secondary to location. XRT   12/01/2012 Imaging   L3 superior endplate compression FX, no lytic lesions to suggest myeloma   02/06/2014 Imaging   soft tissue mass along the right posterior fifth rib with some rib destruction   02/16/2014 Miscellaneous   Normal CBC, Normal CMP, kappa,lamda ratio of 2.62 (abnormal), UIEP with slightly restricted band, IgG kappa, SPEP/IEP with monoclonal protein at 0.79 g/dl, normal igG, suppressed IGM   02/20/2014 Bone Marrow Biopsy   33% kappa restricted plasma cells Normal FISH, normal cytogenetics   02/21/2014 - 03/08/2014 Radiation Therapy   30Gy to R rib lesion, lesion not biopsied   03/16/2014 PET scan   Scattered hypermetabolic osseous lesions, including the calvarium, ribs, left scapula, sacrum, and left femoral shaft. An expansile left vertebral body lesion at L3 extends into the epidural space of the left lateral recess,    03/17/2014 - 04/07/2014 Chemotherapy   Velcade and Dexamethasone due to initial denial of Revlimid by insurance company.  Zometa monthly.   03/22/2014 Imaging   MR_ L-Spine- Enhancing lesions compatible with multiple myeloma at L2, L3, and S1-2.   04/07/2014 - 07/28/2014 Chemotherapy   RVD with Revlimid at 25 mg days 1-14.  Revlimid dose reduced to 25 mg every other day x 14 days with 7 day respite beginning on day 1 of cycle 2.   04/17/2014 Adverse Reaction   Revlimid-induced rash.  Treated with steroids.   07/28/2014 - 08/04/2014 Chemotherapy   Revlimid daily   08/04/2014 Adverse Reaction   Velcade-induced peripheral neuropathy   08/04/2014 Treatment Plan Change   D/C Velcade   08/11/2014 PET scan   Response to therapy. Improvement and resolution of foci of osseous hypermetabolism.   08/22/2014 - 08/28/2014 Chemotherapy   Revlimid 25 mg days 1-21 every 28 days   08/28/2014 Adverse Reaction   Revlimid-induced rash. Medication held.  Medrol dose Pak prescribed.   09/04/2014 - 01/08/2015 Chemotherapy   Revlimid 10 mg every other day, without dexamethasone   01/08/2015 - 01/15/2015 Hospital Admission   Colonic diverticular abscess with IR drain placement   03/02/2015 PET scan   Only bony uptake is low activity at site of deformity/callus at R second rib FX, high activity in sigmoid colon, advanced sigmoid divertidulosis. Abscess not resolved?   06/04/2015 Imaging   CT nonobstructive L renal calculus, diverticulosis of descending and sigmoid colon without inflammation, moderate prostatic enlargement   07/12/2015 Surgery   Diverticulitis s/p robotic sigmoid colectomy with Dr. Johney Maine   08/23/2015 PET scan   Primarily similar hypermetabolic osseous foci within the R sided ribs. new L third rib focus of hypermetab and sclerosis is favored to be related to healing FX. no soft tissue myeloma id'd. Presumed postop hypermetab and edema about the R pelvic wall    03/06/2016 PET scan   1. Reduced activity in the prior lesion such as the right second and fifth rib lesions, activity nearly completely resolved and significantly less than added mediastinal blood pool activity. No new lesions are identified. 2. Other imaging findings of potential clinical significance: Coronary, aortic  arch, and branch vessel atherosclerotic vascular disease. Aortoiliac atherosclerotic vascular disease. Enlarged prostate gland. Colonic diverticula.   02/17/2017 PET scan   HEAD/NECK: No hypermetabolic activity in the scalp. No hypermetabolic cervical lymph nodes. CHEST: No hypermetabolic mediastinal or hilar nodes.  Right upper lobe scarring/atelectasis. No suspicious pulmonary nodules on the CT scan. ABDOMEN/PELVIS: No abnormal hypermetabolic activity within the liver, pancreas, adrenal glands, or spleen. No hypermetabolic lymph nodes in the abdomen or pelvis. Atherosclerotic calcifications of the abdominal aorta and branch vessels. Left renal sinus cysts. Sigmoid diverticulosis, without evidence of diverticulitis. Prostatomegaly. SKELETON: Vague hypermetabolism involving the inferior sternum, max SUV 3.2, previously 8.2. Focal hypermetabolism involving the left sacrum, max SUV 4.0, previously 6.7. EXTREMITIES: No abnormal hypermetabolic activity in the lower extremities.   02/18/2017 -  Chemotherapy   Bortezomib 1.'3mg'$ /m2 QWk + Dexamethasone '10mg'$  QTue/Fri --Cycle #1, 02/18/17    02/18/2017 - 08/26/2017 Chemotherapy   The patient had bortezomib SQ (VELCADE) chemo injection 3 mg, 1.3 mg/m2 = 3 mg, Subcutaneous,  Once, 7 of 9 cycles Administration: 3 mg (02/18/2017), 3 mg (02/26/2017), 3 mg (03/04/2017), 3 mg (03/11/2017), 3 mg (04/08/2017), 3 mg (03/18/2017), 3 mg (03/25/2017), 3 mg (04/01/2017), 3 mg (05/06/2017), 3 mg (05/20/2017), 3 mg (06/03/2017), 3 mg (06/17/2017), 3 mg (07/01/2017), 3 mg (07/15/2017), 3 mg (07/29/2017), 3 mg (08/12/2017), 3 mg (08/26/2017)  for chemotherapy treatment.    09/17/2017 -  Chemotherapy   The patient had daratumumab (DARZALEX) 1,800 mg in sodium chloride 0.9 % 910 mL (1.8 mg/mL) chemo infusion, 15.9 mg/kg = 1,820 mg, Intravenous, Once, 1 of 1 cycle Administration: 1,800 mg (09/17/2017) daratumumab (DARZALEX) 1,800 mg in sodium chloride 0.9 % 410 mL (3.6 mg/mL) chemo infusion, 1,820 mg, Intravenous, Once, 13 of 16 cycles Administration: 1,800 mg (10/28/2017), 1,800 mg (11/04/2017), 1,800 mg (11/11/2017), 1,800 mg (11/18/2017), 1,800 mg (11/25/2017), 1,800 mg (12/02/2017), 1,800 mg (12/09/2017), 1,800 mg (12/16/2017), 1,800 mg (12/30/2017), 1,800 mg (04/26/2018), 1,800 mg (01/13/2018), 1,800 mg (02/12/2018),  1,800 mg (02/26/2018), 1,800 mg (03/15/2018), 1,800 mg (03/29/2018), 1,800 mg (04/12/2018), 1,800 mg (05/10/2018), 1,800 mg (06/07/2018), 1,800 mg (07/05/2018), 1,800 mg (08/02/2018), 1,800 mg (08/30/2018), 1,800 mg (09/27/2018)  for chemotherapy treatment.       CANCER STAGING: Cancer Staging No matching staging information was found for the patient.   INTERVAL HISTORY:  Mr. Frasier 83 y.o. male seen for follow-up of multiple myeloma.  He is receiving monthly daratumumab infusions.  Headaches are better.  He had MRI of the brain done.  He reports decrease in appetite and energy levels.  Pain is well controlled on the current regimen.  He reports difficulty falling asleep.  He has used temazepam which did not help.  Numbness in the hands and feet has been stable.  Denies any fevers or chills.    REVIEW OF SYSTEMS:  Review of Systems  Gastrointestinal: Positive for constipation.  Neurological: Positive for numbness.  Psychiatric/Behavioral: Positive for sleep disturbance.  All other systems reviewed and are negative.    PAST MEDICAL/SURGICAL HISTORY:  Past Medical History:  Diagnosis Date  . BPH (benign prostatic hyperplasia)   . Colonic diverticular abscess 01/08/2015  . Diverticulitis 14/48/18   complicated by abscess and required percutaneous drainage  . GERD (gastroesophageal reflux disease)   . H/O ETOH abuse   . History of chemotherapy last done jan 2017  . History of radiation therapy 01/05/13-02/10/13   45 gray to left occipital condyle region  . History of radiation therapy 12/01/16-12/10/16   Parasternal nodule, chest- 24 Gy total  delivered in 8 fractions, Left sacro-iliac, pelvis- 24 Gy total delivered in 8 fractions   . History of radiation therapy 02/19/17-02/22/17   right temporal scalp 30 Gy in 10 fractions  . Intra-abdominal abscess ([place])   . Multiple myeloma ([place]) 2015  . Neuropathy   . Radiation 02/21/14-03/08/14   right posterior chest wall area 30 gray  . Radiation  04/19/14-05/02/14   lumbar spine 25 gray  . Skull lesion    Left occipital condyle  . Wrist fracture, left    x 2   Past Surgical History:  Procedure Laterality Date  . COLONOSCOPY N/A 04/19/2015   Procedure: COLONOSCOPY;  Surgeon: Danie Binder, MD;  Location: [facility];  Service: Endoscopy;  Laterality: N/A;  1:30 PM  . CYST EXCISION  1959   tail bone  . IR IMAGING GUIDED PORT INSERTION  09/25/2017     SOCIAL HISTORY:  Social History   Socioeconomic History  . Marital status: Married    Spouse name: Not on file  . Number of children: 3  . Years of education: Not on file  . Highest education level: Not on file  Occupational History    Employer: RETIRED  Social Needs  . Financial resource strain: Not on file  . Food insecurity    Worry: Not on file    Inability: Not on file  . Transportation needs    Medical: Not on file    Non-medical: Not on file  Tobacco Use  . Smoking status: Never Smoker  . Smokeless tobacco: Never Used  Substance and Sexual Activity  . Alcohol use: No    Comment: " Not much no more"  . Drug use: No  . Sexual activity: Not on file  Lifestyle  . Physical activity    Days per week: Not on file    Minutes per session: Not on file  . Stress: Not on file  Relationships  . Social Herbalist on phone: Not on file    Gets together: Not on file    Attends religious service: Not on file    Active member of club or organization: Not on file    Attends meetings of clubs or organizations: Not on file    Relationship status: Not on file  . Intimate partner violence    Fear of current or ex partner: Not on file    Emotionally abused: Not on file    Physically abused: Not on file    Forced sexual activity: Not on file  Other Topics Concern  . Not on file  Social History Narrative  . Not on file    FAMILY HISTORY:  Family History  Problem Relation Age of Onset  . Diabetes Mother   . Stroke Mother   . Hypertension Father      CURRENT MEDICATIONS:  Outpatient Encounter Medications as of 09/27/2018  Medication Sig Note  . acyclovir (ZOVIRAX) 400 MG tablet Take 1 tablet (400 mg total) by mouth 2 (two) times daily.   Marland Kitchen ALPRAZolam (XANAX) 1 MG tablet TAKE 2 TABELETS BY MOUTH AT BEDTIME AS NEEDED FOR ANXIETY OR SLEEP   . aspirin EC 325 MG tablet Take 1 tablet (325 mg total) by mouth daily.   Marland Kitchen CALCIUM-VITAMIN D PO Take 1 tablet by mouth 2 (two) times daily.    Marland Kitchen dexamethasone (DECADRON) 4 MG tablet Take 2.5 tablets (10 mg total) by mouth once a week. Take 10 mg once weekly 09/18/2017: Started on 09/18/17  .  furosemide (LASIX) 20 MG tablet TAKE 1 TABLET BY MOUTH DAILY AS NEEDED FOR EDEMA.   Marland Kitchen gabapentin (NEURONTIN) 400 MG capsule Take 1 capsule (400 mg total) by mouth 3 (three) times daily.   Marland Kitchen lidocaine-prilocaine (EMLA) cream Apply small amount over port one (1) hour prior to appointment.   Marland Kitchen omeprazole (PRILOSEC) 20 MG capsule TAKE 1 CAPSULE BY MOUTH ONCE DAILY   . Oxycodone HCl 10 MG TABS Take 1 tablet (10 mg total) by mouth every 8 (eight) hours as needed. for pain   . tamsulosin (FLOMAX) 0.4 MG CAPS capsule TAKE 1 CAPSULE BY MOUTH EVERY NIGHT AT BEDTIME   . zolendronic acid (ZOMETA) 4 MG/5ML injection Inject 4 mg into the vein every 30 (thirty) days.    Marland Kitchen MAGNESIUM PO Take 1 tablet by mouth at bedtime.   . temazepam (RESTORIL) 15 MG capsule TAKE 1 CAPSULE BY MOUTH AT BEDTIME AS NEEDED FOR SLEEP (Patient not taking: Reported on 09/27/2018)   . zolpidem (AMBIEN) 10 MG tablet Take 1 tablet (10 mg total) by mouth at bedtime as needed for sleep.   . [DISCONTINUED] amoxicillin (AMOXIL) 500 MG capsule     No facility-administered encounter medications on file as of 09/27/2018.     ALLERGIES:  Allergies  Allergen Reactions  . Codeine Other (See Comments)    Headache  . Morphine And Related Other (See Comments)    Makes him feel weird  . Revlimid [Lenalidomide] Other (See Comments)    "Causes me to become weak"      PHYSICAL EXAM:  ECOG Performance status: 1  Vitals:   09/27/18 0854  BP: (!) 129/55  Pulse: 65  Resp: 18  Temp: (!) 97.5 F (36.4 C)  SpO2: 99%   Filed Weights   09/27/18 0854  Weight: 240 lb 3.2 oz (109 kg)    Physical Exam Vitals signs reviewed.  Constitutional:      Appearance: Normal appearance.  Cardiovascular:     Rate and Rhythm: Normal rate and regular rhythm.     Heart sounds: Normal heart sounds.  Pulmonary:     Effort: Pulmonary effort is normal.     Breath sounds: Normal breath sounds.  Abdominal:     General: There is no distension.     Palpations: Abdomen is soft. There is no mass.  Musculoskeletal:        General: No swelling.  Skin:    General: Skin is warm.  Neurological:     General: No focal deficit present.     Mental Status: He is alert and oriented to person, place, and time.  Psychiatric:        Mood and Affect: Mood normal.        Behavior: Behavior normal.      LABORATORY DATA:  I have reviewed the labs as listed.  CBC    Component Value Date/Time   WBC 5.4 09/27/2018 0842   RBC 4.56 09/27/2018 0842   HGB 13.6 09/27/2018 0842   HCT 42.2 09/27/2018 0842   PLT 131 (L) 09/27/2018 0842   MCV 92.5 09/27/2018 0842   MCH 29.8 09/27/2018 0842   MCHC 32.2 09/27/2018 0842   RDW 13.4 09/27/2018 0842   LYMPHSABS 1.2 09/27/2018 0842   MONOABS 0.6 09/27/2018 0842   EOSABS 0.0 09/27/2018 0842   BASOSABS 0.0 09/27/2018 0842   CMP Latest Ref Rng & Units 09/27/2018 08/30/2018 08/02/2018  Glucose 70 - 99 mg/dL 96 108(H) 101(H)  BUN 8 - 23 mg/dL 16 17  21  Creatinine 0.61 - 1.24 mg/dL 0.76 0.65 0.81  Sodium 135 - 145 mmol/L 140 139 142  Potassium 3.5 - 5.1 mmol/L 4.2 4.0 4.2  Chloride 98 - 111 mmol/L 109 107 107  CO2 22 - 32 mmol/L '26 25 25  '$ Calcium 8.9 - 10.3 mg/dL 8.3(L) 8.8(L) 8.7(L)  Total Protein 6.5 - 8.1 g/dL 5.4(L) 5.6(L) 5.9(L)  Total Bilirubin 0.3 - 1.2 mg/dL 0.6 0.6 0.6  Alkaline Phos 38 - 126 U/L 24(L) 25(L) 23(L)  AST 15 - 41  U/L '16 17 15  '$ ALT 0 - 44 U/L '12 11 10       '$ DIAGNOSTIC IMAGING:  I have independently reviewed the scans and discussed with the patient.   I have reviewed Venita Lick LPN's note and agree with the documentation.  I personally performed a face-to-face visit, made revisions and my assessment and plan is as follows.    ASSESSMENT & PLAN:   Multiple myeloma without remission ([place]) 1.  Recurrent IgG kappa multiple myeloma: - RVD from 04/07/2014 through 07/28/2014 followed by maintenance Revlimid, which was discontinued. - MRI of the brain on 02/09/2017 showed new right temporal lesion, status post radiation therapy, PET CT in January 2019 showed improvement of multiple myeloma with no new lesions, started on weekly Velcade and dexamethasone on 02/18/2017, status post 2 cycles, Velcade changed every other week on 05/06/2017 secondary to neuropathy. - MRI of the thoracic and lumbar spine on 09/03/2017 showed new rib lesion close to the spine on the left side, possibly eighth rib.  There was soft tissue mass in that area.  There is chronic L4 compression fracture which is stable. -Status post XRT to the left eighth rib lesion, 30 Gy in 10 fractions from 09/29/2017 through 10/14/2017. - DPD regimen started on 09/17/2017, developed erythematous maculopapular rash involving the trunk after taking 3 pills of pomalidomide. - Daratumumab restarted after radiation on 10/28/2017, pomalidomide completely discontinued on 02/12/2018 for recurrent rash. - Myeloma panel on 07/05/2018 shows M spike of 0.1 g/dL.  Free light chain ratio was 3.08.  - He is continuing daratumumab every 4 weeks very well.  He is also taking Decadron 10 mg weekly. - We reviewed results of the MRI of the brain which did not show any evidence of myeloma. - We talked about the option of subcu daratumumab.  We will consider switching it at next time. -We reviewed his labs.  We sent myeloma panel today which will follow.  He may proceed with his  daratumumab today.  We will see him back in 4 weeks.  2.  Peripheral neuropathy: -We will continue gabapentin 3 times daily.  3.  Bone protection: -He is receiving Zometa every 8 weeks.  His Zometa was held last time because of dental fillings.  He can receive it today.  4.  Low back pain: -He is taking oxycodone 10 mg 2 to 3 tablets/day. -This is from combination of DDD and multiple myeloma.  5.  Difficulty falling asleep: - He has used Restoril which did not help.  We will send him prescription for Ambien.  Total time spent is 25 minutes with more than 50% of the time spent face-to-face discussing treatment plan, counseling and coordination of care.    Orders placed this encounter:  Orders Placed This Encounter  Procedures  . Protein electrophoresis, serum  . Kappa/lambda light chains  . Lactate dehydrogenase      Derek Jack, MD [place] [phone]

## 2018-09-28 LAB — KAPPA/LAMBDA LIGHT CHAINS
Kappa free light chain: 9.8 mg/L (ref 3.3–19.4)
Kappa, lambda light chain ratio: 2.58 — ABNORMAL HIGH (ref 0.26–1.65)
Lambda free light chains: 3.8 mg/L — ABNORMAL LOW (ref 5.7–26.3)

## 2018-09-29 LAB — PROTEIN ELECTROPHORESIS, SERUM
A/G Ratio: 1.9 — ABNORMAL HIGH (ref 0.7–1.7)
Albumin ELP: 3.1 g/dL (ref 2.9–4.4)
Alpha-1-Globulin: 0.2 g/dL (ref 0.0–0.4)
Alpha-2-Globulin: 0.7 g/dL (ref 0.4–1.0)
Beta Globulin: 0.6 g/dL — ABNORMAL LOW (ref 0.7–1.3)
Gamma Globulin: 0.1 g/dL — ABNORMAL LOW (ref 0.4–1.8)
Globulin, Total: 1.6 g/dL — ABNORMAL LOW (ref 2.2–3.9)
M-Spike, %: 0.1 g/dL — ABNORMAL HIGH
Total Protein ELP: 4.7 g/dL — ABNORMAL LOW (ref 6.0–8.5)

## 2018-10-25 ENCOUNTER — Inpatient Hospital Stay (HOSPITAL_COMMUNITY): Payer: Medicare Other | Attending: Hematology | Admitting: Hematology

## 2018-10-25 ENCOUNTER — Encounter (HOSPITAL_COMMUNITY): Payer: Self-pay | Admitting: Hematology

## 2018-10-25 ENCOUNTER — Other Ambulatory Visit: Payer: Self-pay

## 2018-10-25 ENCOUNTER — Inpatient Hospital Stay (HOSPITAL_COMMUNITY): Payer: Medicare Other

## 2018-10-25 VITALS — BP 123/80 | HR 90 | Temp 97.1°F | Resp 16 | Wt 232.9 lb

## 2018-10-25 VITALS — BP 103/57 | HR 54 | Temp 96.6°F | Resp 18

## 2018-10-25 DIAGNOSIS — C9000 Multiple myeloma not having achieved remission: Secondary | ICD-10-CM

## 2018-10-25 DIAGNOSIS — Z5112 Encounter for antineoplastic immunotherapy: Secondary | ICD-10-CM | POA: Diagnosis not present

## 2018-10-25 LAB — CBC WITH DIFFERENTIAL/PLATELET
Abs Immature Granulocytes: 0.01 10*3/uL (ref 0.00–0.07)
Basophils Absolute: 0 10*3/uL (ref 0.0–0.1)
Basophils Relative: 0 %
Eosinophils Absolute: 0 10*3/uL (ref 0.0–0.5)
Eosinophils Relative: 0 %
HCT: 47.3 % (ref 39.0–52.0)
Hemoglobin: 15.2 g/dL (ref 13.0–17.0)
Immature Granulocytes: 0 %
Lymphocytes Relative: 31 %
Lymphs Abs: 1.7 10*3/uL (ref 0.7–4.0)
MCH: 29.6 pg (ref 26.0–34.0)
MCHC: 32.1 g/dL (ref 30.0–36.0)
MCV: 92.2 fL (ref 80.0–100.0)
Monocytes Absolute: 0.6 10*3/uL (ref 0.1–1.0)
Monocytes Relative: 11 %
Neutro Abs: 3.2 10*3/uL (ref 1.7–7.7)
Neutrophils Relative %: 58 %
Platelets: 153 10*3/uL (ref 150–400)
RBC: 5.13 MIL/uL (ref 4.22–5.81)
RDW: 13.7 % (ref 11.5–15.5)
WBC: 5.5 10*3/uL (ref 4.0–10.5)
nRBC: 0 % (ref 0.0–0.2)

## 2018-10-25 LAB — COMPREHENSIVE METABOLIC PANEL
ALT: 14 U/L (ref 0–44)
AST: 16 U/L (ref 15–41)
Albumin: 3.7 g/dL (ref 3.5–5.0)
Alkaline Phosphatase: 25 U/L — ABNORMAL LOW (ref 38–126)
Anion gap: 9 (ref 5–15)
BUN: 13 mg/dL (ref 8–23)
CO2: 27 mmol/L (ref 22–32)
Calcium: 9 mg/dL (ref 8.9–10.3)
Chloride: 105 mmol/L (ref 98–111)
Creatinine, Ser: 0.78 mg/dL (ref 0.61–1.24)
GFR calc Af Amer: >60 mL/min (ref >60)
GFR calc non Af Amer: >60 mL/min (ref >60)
Glucose, Bld: 98 mg/dL (ref 70–99)
Potassium: 3.9 mmol/L (ref 3.5–5.1)
Sodium: 141 mmol/L (ref 135–145)
Total Bilirubin: 0.7 mg/dL (ref 0.3–1.2)
Total Protein: 5.9 g/dL — ABNORMAL LOW (ref 6.5–8.1)

## 2018-10-25 MED ORDER — SODIUM CHLORIDE 0.9% FLUSH
10.0000 mL | INTRAVENOUS | Status: DC | PRN
  Administered 2018-10-25: 10:00:00 10 mL
  Filled 2018-10-25: qty 10

## 2018-10-25 MED ORDER — SODIUM CHLORIDE 0.9 % IV SOLN
Freq: Once | INTRAVENOUS | Status: AC
  Administered 2018-10-25: 10:00:00 via INTRAVENOUS

## 2018-10-25 MED ORDER — PROCHLORPERAZINE MALEATE 10 MG PO TABS
10.0000 mg | ORAL_TABLET | Freq: Once | ORAL | Status: AC
  Administered 2018-10-25: 10 mg via ORAL
  Filled 2018-10-25: qty 1

## 2018-10-25 MED ORDER — SODIUM CHLORIDE 0.9 % IV SOLN
20.0000 mg | Freq: Once | INTRAVENOUS | Status: AC
  Administered 2018-10-25: 20 mg via INTRAVENOUS
  Filled 2018-10-25: qty 20

## 2018-10-25 MED ORDER — DARATUMUMAB-HYALURONIDASE-FIHJ 1800-30000 MG-UT/15ML ~~LOC~~ SOLN
1800.0000 mg | Freq: Once | SUBCUTANEOUS | Status: AC
  Administered 2018-10-25: 1800 mg via SUBCUTANEOUS
  Filled 2018-10-25: qty 15

## 2018-10-25 MED ORDER — ACETAMINOPHEN 325 MG PO TABS
650.0000 mg | ORAL_TABLET | Freq: Once | ORAL | Status: AC
  Administered 2018-10-25: 650 mg via ORAL
  Filled 2018-10-25: qty 2

## 2018-10-25 MED ORDER — HEPARIN SOD (PORK) LOCK FLUSH 100 UNIT/ML IV SOLN
500.0000 [IU] | Freq: Once | INTRAVENOUS | Status: AC | PRN
  Administered 2018-10-25: 13:00:00 500 [IU]

## 2018-10-25 MED ORDER — DIPHENHYDRAMINE HCL 25 MG PO CAPS
50.0000 mg | ORAL_CAPSULE | Freq: Once | ORAL | Status: AC
  Administered 2018-10-25: 10:00:00 25 mg via ORAL
  Filled 2018-10-25: qty 2

## 2018-10-25 NOTE — Progress Notes (Signed)
[place], [place] [address]   CLINIC:  Medical Oncology/Hematology  PCP:  [name], MD [address] [place] [address] [phone]   REASON FOR VISIT:  Follow-up for  IgG kappa multiple myeloma   BRIEF ONCOLOGIC HISTORY:  Oncology History  Multiple myeloma without remission ([place])  11/16/2012 Initial Diagnosis   L occipital condyle destructive lesion, not biopsied secondary to location. XRT   12/01/2012 Imaging   L3 superior endplate compression FX, no lytic lesions to suggest myeloma   02/06/2014 Imaging   soft tissue mass along the right posterior fifth rib with some rib destruction   02/16/2014 Miscellaneous   Normal CBC, Normal CMP, kappa,lamda ratio of 2.62 (abnormal), UIEP with slightly restricted band, IgG kappa, SPEP/IEP with monoclonal protein at 0.79 g/dl, normal igG, suppressed IGM   02/20/2014 Bone Marrow Biopsy   33% kappa restricted plasma cells Normal FISH, normal cytogenetics   02/21/2014 - 03/08/2014 Radiation Therapy   30Gy to R rib lesion, lesion not biopsied   03/16/2014 PET scan   Scattered hypermetabolic osseous lesions, including the calvarium, ribs, left scapula, sacrum, and left femoral shaft. An expansile left vertebral body lesion at L3 extends into the epidural space of the left lateral recess,    03/17/2014 - 04/07/2014 Chemotherapy   Velcade and Dexamethasone due to initial denial of Revlimid by insurance company.  Zometa monthly.   03/22/2014 Imaging   MR_ L-Spine- Enhancing lesions compatible with multiple myeloma at L2, L3, and S1-2.   04/07/2014 - 07/28/2014 Chemotherapy   RVD with Revlimid at 25 mg days 1-14.  Revlimid dose reduced to 25 mg every other day x 14 days with 7 day respite beginning on day 1 of cycle 2.   04/17/2014 Adverse Reaction   Revlimid-induced rash.  Treated with steroids.   07/28/2014 - 08/04/2014 Chemotherapy   Revlimid daily   08/04/2014 Adverse Reaction   Velcade-induced peripheral neuropathy   08/04/2014 Treatment Plan Change   D/C Velcade   08/11/2014 PET scan   Response to therapy. Improvement and resolution of foci of osseous hypermetabolism.   08/22/2014 - 08/28/2014 Chemotherapy   Revlimid 25 mg days 1-21 every 28 days   08/28/2014 Adverse Reaction   Revlimid-induced rash. Medication held.  Medrol dose Pak prescribed.   09/04/2014 - 01/08/2015 Chemotherapy   Revlimid 10 mg every other day, without dexamethasone   01/08/2015 - 01/15/2015 Hospital Admission   Colonic diverticular abscess with IR drain placement   03/02/2015 PET scan   Only bony uptake is low activity at site of deformity/callus at R second rib FX, high activity in sigmoid colon, advanced sigmoid divertidulosis. Abscess not resolved?   06/04/2015 Imaging   CT nonobstructive L renal calculus, diverticulosis of descending and sigmoid colon without inflammation, moderate prostatic enlargement   07/12/2015 Surgery   Diverticulitis s/p robotic sigmoid colectomy with Dr. Johney Maine   08/23/2015 PET scan   Primarily similar hypermetabolic osseous foci within the R sided ribs. new L third rib focus of hypermetab and sclerosis is favored to be related to healing FX. no soft tissue myeloma id'd. Presumed postop hypermetab and edema about the R pelvic wall    03/06/2016 PET scan   1. Reduced activity in the prior lesion such as the right second and fifth rib lesions, activity nearly completely resolved and significantly less than added mediastinal blood pool activity. No new lesions are identified. 2. Other imaging findings of potential clinical significance: Coronary, aortic  arch, and branch vessel atherosclerotic vascular disease. Aortoiliac atherosclerotic vascular disease. Enlarged prostate gland. Colonic diverticula.   02/17/2017 PET scan   HEAD/NECK: No hypermetabolic activity in the scalp. No hypermetabolic cervical lymph nodes. CHEST: No hypermetabolic mediastinal or hilar nodes.  Right upper lobe scarring/atelectasis. No suspicious pulmonary nodules on the CT scan. ABDOMEN/PELVIS: No abnormal hypermetabolic activity within the liver, pancreas, adrenal glands, or spleen. No hypermetabolic lymph nodes in the abdomen or pelvis. Atherosclerotic calcifications of the abdominal aorta and branch vessels. Left renal sinus cysts. Sigmoid diverticulosis, without evidence of diverticulitis. Prostatomegaly. SKELETON: Vague hypermetabolism involving the inferior sternum, max SUV 3.2, previously 8.2. Focal hypermetabolism involving the left sacrum, max SUV 4.0, previously 6.7. EXTREMITIES: No abnormal hypermetabolic activity in the lower extremities.   02/18/2017 -  Chemotherapy   Bortezomib 1.'3mg'$ /m2 QWk + Dexamethasone '10mg'$  QTue/Fri --Cycle #1, 02/18/17    02/18/2017 - 08/26/2017 Chemotherapy   The patient had bortezomib SQ (VELCADE) chemo injection 3 mg, 1.3 mg/m2 = 3 mg, Subcutaneous,  Once, 7 of 9 cycles Administration: 3 mg (02/18/2017), 3 mg (02/26/2017), 3 mg (03/04/2017), 3 mg (03/11/2017), 3 mg (04/08/2017), 3 mg (03/18/2017), 3 mg (03/25/2017), 3 mg (04/01/2017), 3 mg (05/06/2017), 3 mg (05/20/2017), 3 mg (06/03/2017), 3 mg (06/17/2017), 3 mg (07/01/2017), 3 mg (07/15/2017), 3 mg (07/29/2017), 3 mg (08/12/2017), 3 mg (08/26/2017)  for chemotherapy treatment.    09/17/2017 -  Chemotherapy   The patient had daratumumab-hyaluronidase-fihj (DARZALEX FASPRO) 1800-30000 MG-UT/15ML chemo SQ injection 1,800 mg, 1,800 mg, Subcutaneous,  Once, 1 of 3 cycles Administration: 1,800 mg (10/25/2018) daratumumab (DARZALEX) 1,800 mg in sodium chloride 0.9 % 910 mL (1.8 mg/mL) chemo infusion, 15.9 mg/kg = 1,820 mg, Intravenous, Once, 1 of 1 cycle Administration: 1,800 mg (09/17/2017) daratumumab (DARZALEX) 1,800 mg in sodium chloride 0.9 % 410 mL (3.6 mg/mL) chemo infusion, 1,820 mg, Intravenous, Once, 13 of 13 cycles Administration: 1,800 mg (10/28/2017), 1,800 mg (11/04/2017), 1,800 mg (11/11/2017), 1,800 mg  (11/18/2017), 1,800 mg (11/25/2017), 1,800 mg (12/02/2017), 1,800 mg (12/09/2017), 1,800 mg (12/16/2017), 1,800 mg (12/30/2017), 1,800 mg (04/26/2018), 1,800 mg (01/13/2018), 1,800 mg (02/12/2018), 1,800 mg (02/26/2018), 1,800 mg (03/15/2018), 1,800 mg (03/29/2018), 1,800 mg (04/12/2018), 1,800 mg (05/10/2018), 1,800 mg (06/07/2018), 1,800 mg (07/05/2018), 1,800 mg (08/02/2018), 1,800 mg (08/30/2018), 1,800 mg (09/27/2018)  for chemotherapy treatment.       CANCER STAGING: Cancer Staging No matching staging information was found for the patient.   INTERVAL HISTORY:  Mr. Germond 83 y.o. male seen for follow-up of multiple myeloma.  He is continuing to tolerate daratumumab monthly very well.  He reports worsening of low back pain.  He is taking oxycodone every 8 hours.  Pain relief lasts only 3 to 4 hours.  He also has some sinus symptoms.  Denies any new onset pains.  Denies any GI side effects.  Is taking dexamethasone once a week.    REVIEW OF SYSTEMS:  Review of Systems  Cardiovascular: Positive for leg swelling.  Neurological: Positive for headaches.  Psychiatric/Behavioral: Positive for sleep disturbance.  All other systems reviewed and are negative.    PAST MEDICAL/SURGICAL HISTORY:  Past Medical History:  Diagnosis Date  . BPH (benign prostatic hyperplasia)   . Colonic diverticular abscess 01/08/2015  . Diverticulitis 96/29/52   complicated by abscess and required percutaneous drainage  . GERD (gastroesophageal reflux disease)   . H/O ETOH abuse   . History of chemotherapy last done jan 2017  . History of radiation therapy 01/05/13-02/10/13   45 gray to left occipital  condyle region  . History of radiation therapy 12/01/16-12/10/16   Parasternal nodule, chest- 24 Gy total delivered in 8 fractions, Left sacro-iliac, pelvis- 24 Gy total delivered in 8 fractions   . History of radiation therapy 02/19/17-02/22/17   right temporal scalp 30 Gy in 10 fractions  . Intra-abdominal abscess ([place])   .  Multiple myeloma ([facility]) 2015  . Neuropathy   . Radiation 02/21/14-03/08/14   right posterior chest wall area 30 gray  . Radiation 04/19/14-05/02/14   lumbar spine 25 gray  . Skull lesion    Left occipital condyle  . Wrist fracture, left    x 2   Past Surgical History:  Procedure Laterality Date  . COLONOSCOPY N/A 04/19/2015   Procedure: COLONOSCOPY;  Surgeon: Danie Binder, MD;  Location: [facility];  Service: Endoscopy;  Laterality: N/A;  1:30 PM  . CYST EXCISION  1959   tail bone  . IR IMAGING GUIDED PORT INSERTION  09/25/2017     SOCIAL HISTORY:  Social History   Socioeconomic History  . Marital status: Married    Spouse name: Not on file  . Number of children: 3  . Years of education: Not on file  . Highest education level: Not on file  Occupational History    Employer: RETIRED  Social Needs  . Financial resource strain: Not on file  . Food insecurity    Worry: Not on file    Inability: Not on file  . Transportation needs    Medical: Not on file    Non-medical: Not on file  Tobacco Use  . Smoking status: Never Smoker  . Smokeless tobacco: Never Used  Substance and Sexual Activity  . Alcohol use: No    Comment: " Not much no more"  . Drug use: No  . Sexual activity: Not on file  Lifestyle  . Physical activity    Days per week: Not on file    Minutes per session: Not on file  . Stress: Not on file  Relationships  . Social Herbalist on phone: Not on file    Gets together: Not on file    Attends religious service: Not on file    Active member of club or organization: Not on file    Attends meetings of clubs or organizations: Not on file    Relationship status: Not on file  . Intimate partner violence    Fear of current or ex partner: Not on file    Emotionally abused: Not on file    Physically abused: Not on file    Forced sexual activity: Not on file  Other Topics Concern  . Not on file  Social History Narrative  . Not on file    FAMILY  HISTORY:  Family History  Problem Relation Age of Onset  . Diabetes Mother   . Stroke Mother   . Hypertension Father     CURRENT MEDICATIONS:  Outpatient Encounter Medications as of 10/25/2018  Medication Sig Note  . acyclovir (ZOVIRAX) 400 MG tablet Take 1 tablet (400 mg total) by mouth 2 (two) times daily.   Marland Kitchen ALPRAZolam (XANAX) 1 MG tablet TAKE 2 TABELETS BY MOUTH AT BEDTIME AS NEEDED FOR ANXIETY OR SLEEP   . aspirin EC 325 MG tablet Take 1 tablet (325 mg total) by mouth daily.   Marland Kitchen CALCIUM-VITAMIN D PO Take 1 tablet by mouth 2 (two) times daily.    Marland Kitchen dexamethasone (DECADRON) 4 MG tablet Take 2.5 tablets (10  mg total) by mouth once a week. Take 10 mg once weekly 09/18/2017: Started on 09/18/17  . furosemide (LASIX) 20 MG tablet TAKE 1 TABLET BY MOUTH DAILY AS NEEDED FOR EDEMA.   Marland Kitchen gabapentin (NEURONTIN) 400 MG capsule Take 1 capsule (400 mg total) by mouth 3 (three) times daily.   Marland Kitchen lidocaine-prilocaine (EMLA) cream Apply small amount over port one (1) hour prior to appointment.   Marland Kitchen MAGNESIUM PO Take 1 tablet by mouth at bedtime.   Marland Kitchen omeprazole (PRILOSEC) 20 MG capsule TAKE 1 CAPSULE BY MOUTH ONCE DAILY   . Oxycodone HCl 10 MG TABS Take 1 tablet (10 mg total) by mouth every 8 (eight) hours as needed. for pain   . tamsulosin (FLOMAX) 0.4 MG CAPS capsule TAKE 1 CAPSULE BY MOUTH EVERY NIGHT AT BEDTIME   . temazepam (RESTORIL) 15 MG capsule TAKE 1 CAPSULE BY MOUTH AT BEDTIME AS NEEDED FOR SLEEP (Patient not taking: Reported on 09/27/2018)   . zolendronic acid (ZOMETA) 4 MG/5ML injection Inject 4 mg into the vein every 30 (thirty) days.    Marland Kitchen zolpidem (AMBIEN) 10 MG tablet Take 1 tablet (10 mg total) by mouth at bedtime as needed for sleep.    No facility-administered encounter medications on file as of 10/25/2018.     ALLERGIES:  Allergies  Allergen Reactions  . Codeine Other (See Comments)    Headache  . Morphine And Related Other (See Comments)    Makes him feel weird  . Revlimid  [Lenalidomide] Other (See Comments)    "Causes me to become weak"     PHYSICAL EXAM:  ECOG Performance status: 1  Vitals:   10/25/18 0846  BP: 123/80  Pulse: 90  Resp: 16  Temp: (!) 97.1 F (36.2 C)  SpO2: 99%   Filed Weights   10/25/18 0846  Weight: 232 lb 14.4 oz (105.6 kg)    Physical Exam Vitals signs reviewed.  Constitutional:      Appearance: Normal appearance.  Cardiovascular:     Rate and Rhythm: Normal rate and regular rhythm.     Heart sounds: Normal heart sounds.  Pulmonary:     Effort: Pulmonary effort is normal.     Breath sounds: Normal breath sounds.  Abdominal:     General: There is no distension.     Palpations: Abdomen is soft. There is no mass.  Musculoskeletal:        General: No swelling.  Skin:    General: Skin is warm.  Neurological:     General: No focal deficit present.     Mental Status: He is alert and oriented to person, place, and time.  Psychiatric:        Mood and Affect: Mood normal.        Behavior: Behavior normal.      LABORATORY DATA:  I have reviewed the labs as listed.  CBC    Component Value Date/Time   WBC 5.5 10/25/2018 0848   RBC 5.13 10/25/2018 0848   HGB 15.2 10/25/2018 0848   HCT 47.3 10/25/2018 0848   PLT 153 10/25/2018 0848   MCV 92.2 10/25/2018 0848   MCH 29.6 10/25/2018 0848   MCHC 32.1 10/25/2018 0848   RDW 13.7 10/25/2018 0848   LYMPHSABS 1.7 10/25/2018 0848   MONOABS 0.6 10/25/2018 0848   EOSABS 0.0 10/25/2018 0848   BASOSABS 0.0 10/25/2018 0848   CMP Latest Ref Rng & Units 10/25/2018 09/27/2018 08/30/2018  Glucose 70 - 99 mg/dL 98 96 108(H)  BUN  8 - 23 mg/dL '13 16 17  '$ Creatinine 0.61 - 1.24 mg/dL 0.78 0.76 0.65  Sodium 135 - 145 mmol/L 141 140 139  Potassium 3.5 - 5.1 mmol/L 3.9 4.2 4.0  Chloride 98 - 111 mmol/L 105 109 107  CO2 22 - 32 mmol/L '27 26 25  '$ Calcium 8.9 - 10.3 mg/dL 9.0 8.3(L) 8.8(L)  Total Protein 6.5 - 8.1 g/dL 5.9(L) 5.4(L) 5.6(L)  Total Bilirubin 0.3 - 1.2 mg/dL 0.7 0.6 0.6   Alkaline Phos 38 - 126 U/L 25(L) 24(L) 25(L)  AST 15 - 41 U/L '16 16 17  '$ ALT 0 - 44 U/L '14 12 11       '$ DIAGNOSTIC IMAGING:  I have independently reviewed the scans and discussed with the patient.   I have reviewed Venita Lick LPN's note and agree with the documentation.  I personally performed a face-to-face visit, made revisions and my assessment and plan is as follows.    ASSESSMENT & PLAN:   Multiple myeloma without remission ([facility]) 1.  Recurrent IgG kappa multiple myeloma: - RVD from 04/07/2014 through 07/28/2014 followed by maintenance Revlimid, which was discontinued. - MRI of the brain on 02/09/2017 showed new right temporal lesion, status post radiation therapy, PET CT in January 2019 showed improvement of multiple myeloma with no new lesions, started on weekly Velcade and dexamethasone on 02/18/2017, status post 2 cycles, Velcade changed every other week on 05/06/2017 secondary to neuropathy. - MRI of the thoracic and lumbar spine on 09/03/2017 showed new rib lesion close to the spine on the left side, possibly eighth rib.  There was soft tissue mass in that area.  There is chronic L4 compression fracture which is stable. -Status post XRT to the left eighth rib lesion, 30 Gy in 10 fractions from 09/29/2017 through 10/14/2017. - DPD regimen started on 09/17/2017, developed erythematous maculopapular rash involving the trunk after taking 3 pills of pomalidomide. - Daratumumab restarted after radiation on 10/28/2017, pomalidomide completely discontinued on 02/12/2018 for recurrent rash. - He is continuing daratumumab every 4 weeks very well.  He is taking Decadron 10 mg weekly. -We talked about switching him to subcu daratumumab.  He was okay with it.  He will proceed with his first subcu daratumumab today. - We reviewed myeloma panel from 09/27/2018.  M spike is 0.1.  Free light chain ratio improved to 2.58 from 3.08. - We will reevaluate him in 4 weeks.  2.  Peripheral neuropathy: -We will  continue gabapentin 3 times daily.  3.  Bone protection: -He is receiving Zometa every 8 weeks.  4.  Low back pain: -He is taking oxycodone 10 mg 3 tablets daily.  He reports worsening of pain. -He reports the pain relief lasting only 3 hours. -He was told to take oxycodone every 4-6 hours as needed.  5.  Difficulty falling asleep: - He has used Restoril which did not help.  We will send him prescription for Ambien.  Total time spent is 25 minutes with more than 50% of the time spent face-to-face discussing treatment plan, counseling and coordination of care.    Orders placed this encounter:  Orders Placed This Encounter  Procedures  . Testosterone  . Protein electrophoresis, serum  . Kappa/lambda light chains      Derek Jack, [place] [phone]

## 2018-10-25 NOTE — Progress Notes (Signed)
Patient to treatment area for follow up and chemotherapy.  Reviewed side effects of treatment with understanding verbalized.  New consent signed for Dara Subq with all questions asked and answered.  No s/s of distress noted.   Patient seen by Dr. Delton Coombes with lab review and ok to treat today.  Instructed to keep the patient for one hour after injection verbal order Dr. Delton Coombes.   Patient tolerated injection with no complaints voiced.  Site clean and dry with no bruising or swelling noted at site.  Band aid applied.  Vss with discharge and left ambulatory with no s/s of distress noted.

## 2018-10-25 NOTE — Patient Instructions (Addendum)
[facility] at [facility] Discharge Instructions  You were seen today by Dr. Delton Coombes. He went over your recent lab results. He will see you back in 4 weeks for labs and follow up.  Start taking your pain medication 4 times a day instead of 3 times a day. Try taking over the counter sinus medication to help with your sinus.  Thank you for choosing [facility] at [facility] to provide your oncology and hematology care.  To afford each patient quality time with our provider, please arrive at least 15 minutes before your scheduled appointment time.   If you have a lab appointment with the [facility] please come in thru the  Main Entrance and check in at the main information desk  You need to re-schedule your appointment should you arrive 10 or more minutes late.  We strive to give you quality time with our providers, and arriving late affects you and other patients whose appointments are after yours.  Also, if you no show three or more times for appointments you may be dismissed from the clinic at the providers discretion.     Again, thank you for choosing [facility].  Our hope is that these requests will decrease the amount of time that you wait before being seen by our physicians.       _____________________________________________________________  Should you have questions after your visit to [facility], please contact our office at [phone] between the hours of 8:00 a.m. and 4:30 p.m.  Voicemails left after 4:00 p.m. will not be returned until the following business day.  For prescription refill requests, have your pharmacy contact our office and allow 72 hours.    Cancer Center Support Programs:   > Cancer Support Group  2nd Tuesday of the month 1pm-2pm, Journey Room

## 2018-10-25 NOTE — Assessment & Plan Note (Signed)
1.  Recurrent IgG kappa multiple myeloma: - RVD from 04/07/2014 through 07/28/2014 followed by maintenance Revlimid, which was discontinued. - MRI of the brain on 02/09/2017 showed new right temporal lesion, status post radiation therapy, PET CT in January 2019 showed improvement of multiple myeloma with no new lesions, started on weekly Velcade and dexamethasone on 02/18/2017, status post 2 cycles, Velcade changed every other week on 05/06/2017 secondary to neuropathy. - MRI of the thoracic and lumbar spine on 09/03/2017 showed new rib lesion close to the spine on the left side, possibly eighth rib.  There was soft tissue mass in that area.  There is chronic L4 compression fracture which is stable. -Status post XRT to the left eighth rib lesion, 30 Gy in 10 fractions from 09/29/2017 through 10/14/2017. - DPD regimen started on 09/17/2017, developed erythematous maculopapular rash involving the trunk after taking 3 pills of pomalidomide. - Daratumumab restarted after radiation on 10/28/2017, pomalidomide completely discontinued on 02/12/2018 for recurrent rash. - He is continuing daratumumab every 4 weeks very well.  He is taking Decadron 10 mg weekly. -We talked about switching him to subcu daratumumab.  He was okay with it.  He will proceed with his first subcu daratumumab today. - We reviewed myeloma panel from 09/27/2018.  M spike is 0.1.  Free light chain ratio improved to 2.58 from 3.08. - We will reevaluate him in 4 weeks.  2.  Peripheral neuropathy: -We will continue gabapentin 3 times daily.  3.  Bone protection: -He is receiving Zometa every 8 weeks.  4.  Low back pain: -He is taking oxycodone 10 mg 3 tablets daily.  He reports worsening of pain. -He reports the pain relief lasting only 3 hours. -He was told to take oxycodone every 4-6 hours as needed.  5.  Difficulty falling asleep: - He has used Restoril which did not help.  We will send him prescription for Ambien.

## 2018-10-26 LAB — KAPPA/LAMBDA LIGHT CHAINS
Kappa free light chain: 10.8 mg/L (ref 3.3–19.4)
Kappa, lambda light chain ratio: 2.77 — ABNORMAL HIGH (ref 0.26–1.65)
Lambda free light chains: 3.9 mg/L — ABNORMAL LOW (ref 5.7–26.3)

## 2018-10-26 LAB — TESTOSTERONE: Testosterone: 535 ng/dL (ref 264–916)

## 2018-10-27 LAB — PROTEIN ELECTROPHORESIS, SERUM
A/G Ratio: 1.6 (ref 0.7–1.7)
Albumin ELP: 3.3 g/dL (ref 2.9–4.4)
Alpha-1-Globulin: 0.2 g/dL (ref 0.0–0.4)
Alpha-2-Globulin: 0.9 g/dL (ref 0.4–1.0)
Beta Globulin: 0.7 g/dL (ref 0.7–1.3)
Gamma Globulin: 0.2 g/dL — ABNORMAL LOW (ref 0.4–1.8)
Globulin, Total: 2.1 g/dL — ABNORMAL LOW (ref 2.2–3.9)
M-Spike, %: 0.1 g/dL — ABNORMAL HIGH
Total Protein ELP: 5.4 g/dL — ABNORMAL LOW (ref 6.0–8.5)

## 2018-11-02 ENCOUNTER — Other Ambulatory Visit: Payer: Self-pay | Admitting: Family Medicine

## 2018-11-02 ENCOUNTER — Other Ambulatory Visit (HOSPITAL_COMMUNITY): Payer: Self-pay | Admitting: Nurse Practitioner

## 2018-11-02 ENCOUNTER — Other Ambulatory Visit (HOSPITAL_COMMUNITY): Payer: Self-pay | Admitting: Hematology

## 2018-11-02 DIAGNOSIS — C9000 Multiple myeloma not having achieved remission: Secondary | ICD-10-CM

## 2018-11-02 DIAGNOSIS — N400 Enlarged prostate without lower urinary tract symptoms: Secondary | ICD-10-CM

## 2018-11-08 ENCOUNTER — Other Ambulatory Visit: Payer: Self-pay | Admitting: Family Medicine

## 2018-11-08 ENCOUNTER — Other Ambulatory Visit: Payer: Medicare Other

## 2018-11-08 ENCOUNTER — Other Ambulatory Visit: Payer: Self-pay

## 2018-11-08 DIAGNOSIS — R350 Frequency of micturition: Secondary | ICD-10-CM

## 2018-11-08 LAB — URINALYSIS, ROUTINE W REFLEX MICROSCOPIC
Bilirubin Urine: NEGATIVE
Glucose, UA: NEGATIVE
Hgb urine dipstick: NEGATIVE
Ketones, ur: NEGATIVE
Leukocytes,Ua: NEGATIVE
Nitrite: NEGATIVE
Protein, ur: NEGATIVE
Specific Gravity, Urine: 1.02 (ref 1.001–1.03)
pH: 5.5 (ref 5.0–8.0)

## 2018-11-08 MED ORDER — AMOXICILLIN-POT CLAVULANATE 875-125 MG PO TABS: 1.0000 | ORAL_TABLET | Freq: Two times a day (BID) | ORAL | 0 refills | Status: DC

## 2018-11-08 NOTE — Progress Notes (Signed)
Spoke with patient and informed him that Dr. Dennard Schaumann is sending in augmentin. Patient verbalized understanding.

## 2018-11-08 NOTE — Progress Notes (Signed)
Try augmentin 875 mg pobid for 10 days for sinsuitis.

## 2018-11-08 NOTE — Progress Notes (Signed)
Patient came in with c/o urinary frequency and fatigue. Patient also has c/o sinus pressure, nasal congestion with possible Sinusitis. Advised patient that he could leave a urine sample. Patient was requesting a medication to be called in states his symptoms are very bothersome. Advised him that I will ask his PCP. Please advise?

## 2018-11-09 ENCOUNTER — Encounter: Payer: Self-pay | Admitting: Family Medicine

## 2018-11-09 ENCOUNTER — Ambulatory Visit (INDEPENDENT_AMBULATORY_CARE_PROVIDER_SITE_OTHER): Payer: Medicare Other | Admitting: Family Medicine

## 2018-11-09 ENCOUNTER — Other Ambulatory Visit: Payer: Self-pay

## 2018-11-09 VITALS — BP 124/74 | HR 90 | Temp 97.3°F | Resp 18 | Ht 69.0 in | Wt 229.0 lb

## 2018-11-09 DIAGNOSIS — G4700 Insomnia, unspecified: Secondary | ICD-10-CM | POA: Diagnosis not present

## 2018-11-09 DIAGNOSIS — T3995XA Adverse effect of unspecified nonopioid analgesic, antipyretic and antirheumatic, initial encounter: Secondary | ICD-10-CM

## 2018-11-09 DIAGNOSIS — C9000 Multiple myeloma not having achieved remission: Secondary | ICD-10-CM

## 2018-11-09 DIAGNOSIS — G4440 Drug-induced headache, not elsewhere classified, not intractable: Secondary | ICD-10-CM | POA: Diagnosis not present

## 2018-11-09 DIAGNOSIS — R350 Frequency of micturition: Secondary | ICD-10-CM

## 2018-11-09 DIAGNOSIS — R232 Flushing: Secondary | ICD-10-CM

## 2018-11-09 DIAGNOSIS — R6889 Other general symptoms and signs: Secondary | ICD-10-CM | POA: Diagnosis not present

## 2018-11-09 MED ORDER — ALPRAZOLAM 1 MG PO TABS: ORAL_TABLET | ORAL | 0 refills | Status: DC

## 2018-11-09 MED ORDER — CIPROFLOXACIN HCL 500 MG PO TABS: 500.0000 mg | ORAL_TABLET | Freq: Two times a day (BID) | ORAL | 0 refills | Status: DC

## 2018-11-09 MED ORDER — TIZANIDINE HCL 4 MG PO TABS: 4.0000 mg | ORAL_TABLET | Freq: Four times a day (QID) | ORAL | 0 refills | Status: DC | PRN

## 2018-11-09 NOTE — Progress Notes (Signed)
Subjective:    Patient ID: Dean [name]., male    DOB: 1935/12/10, 83 y.o.   MRN: [id number]  HPI Patient presents today with several concerns.  First of all yesterday he stopped by complaining of dysuria.  A urinalysis was unremarkable yesterday.  He was also complaining of what he thought was a sinus infection so I did call out Augmentin last night for possible sinus infection.  However talking to the patient today, his symptoms do not sound like a sinus infection.  He states that for the last year or more he has had episodic headaches.  They occur probably 4 times a week.  They radiate from above his right eye to above his left eye.  Is more of an aching throbbing pain.  He recently had an MRI of the brain in August that showed a stable chronic left occipital lesion but no new lesions related to his multiple myeloma.  Patient has been taking Goody powders throughout the year to treat these headaches.  If he takes a Goody powder the headache goes away.  If he does not the headache last and seems to get worse.  This raises the concern for possible rebound headache to [facility] powders.  He also reports frequency urgency and dysuria.  Despite the normal urinalysis he was treated in the past for prostatitis by his oncologist with Cipro and the symptoms improved.  They just returned approximately 1 week ago.  He also reports hot flashes.  He has a history of multiple myeloma currently in remission.  Hot flashes have been occurring off and on for quite some time.  He denies any fevers.  He is not been checked yet for hypogonadism or hypothyroidism.  However his most recent CMP and CBC in September were excellent.  He is currently off his chemotherapy.  He also reports insomnia.  His previous oncologist was giving him Xanax which she could take 1:59 at night to help him sleep.  This seems to be the only thing that helps him sleep.  He never mixes this with his oxycodone that he takes for bone pain.  He is  questioning if I would refill his insomnia medicine No visits with results within 1 Day(s) from this visit.  Latest known visit with results is:  Appointment on 11/08/2018  Component Date Value Ref Range Status   Color, Urine 11/08/2018 YELLOW  YELLOW Final   APPearance 11/08/2018 CLOUDY* CLEAR Final   Specific Gravity, Urine 11/08/2018 1.020  1.001 - 1.03 Final   pH 11/08/2018 5.5  5.0 - 8.0 Final   Glucose, UA 11/08/2018 NEGATIVE  NEGATIVE Final   Bilirubin Urine 11/08/2018 NEGATIVE  NEGATIVE Final   Ketones, ur 11/08/2018 NEGATIVE  NEGATIVE Final   Hgb urine dipstick 11/08/2018 NEGATIVE  NEGATIVE Final   Protein, ur 11/08/2018 NEGATIVE  NEGATIVE Final   Nitrite 11/08/2018 NEGATIVE  NEGATIVE Final   Leukocytes,Ua 11/08/2018 NEGATIVE  NEGATIVE Final    Past Medical History:  Diagnosis Date   BPH (benign prostatic hyperplasia)    Colonic diverticular abscess 01/08/2015   Diverticulitis 62/22/97   complicated by abscess and required percutaneous drainage   GERD (gastroesophageal reflux disease)    H/O ETOH abuse    History of chemotherapy last done jan 2017   History of radiation therapy 01/05/13-02/10/13   45 gray to left occipital condyle region   History of radiation therapy 12/01/16-12/10/16   Parasternal nodule, chest- 24 Gy total delivered in 8 fractions, Left sacro-iliac, pelvis-  24 Gy total delivered in 8 fractions    History of radiation therapy 02/19/17-02/22/17   right temporal scalp 30 Gy in 10 fractions   Intra-abdominal abscess (HCC)    Multiple myeloma ([facility]) 2015   Neuropathy    Radiation 02/21/14-03/08/14   right posterior chest wall area 30 gray   Radiation 04/19/14-05/02/14   lumbar spine 25 gray   Skull lesion    Left occipital condyle   Wrist fracture, left    x 2   Past Surgical History:  Procedure Laterality Date   COLONOSCOPY N/A 04/19/2015   Procedure: COLONOSCOPY;  Surgeon: Danie Binder, MD;  Location: [facility];   Service: Endoscopy;  Laterality: N/A;  1:30 PM   CYST EXCISION  1959   tail bone   IR IMAGING GUIDED PORT INSERTION  09/25/2017   Current Outpatient Medications on File Prior to Visit  Medication Sig Dispense Refill   acyclovir (ZOVIRAX) 400 MG tablet TAKE 1 TABLET BY MOUTH 2 TIMES DAILY 60 tablet 3   aspirin EC 325 MG tablet Take 1 tablet (325 mg total) by mouth daily. 30 tablet 11   CALCIUM-VITAMIN D PO Take 1 tablet by mouth 2 (two) times daily.      dexamethasone (DECADRON) 4 MG tablet Take 2.5 tablets (10 mg total) by mouth once a week. Take 10 mg once weekly 10 tablet 12   furosemide (LASIX) 20 MG tablet TAKE 1 TABLET BY MOUTH DAILY AS NEEDED FOR EDEMA. 30 tablet 0   gabapentin (NEURONTIN) 400 MG capsule Take 1 capsule (400 mg total) by mouth 3 (three) times daily. 90 capsule 4   lidocaine-prilocaine (EMLA) cream Apply small amount over port one (1) hour prior to appointment. 30 g 0   MAGNESIUM PO Take 1 tablet by mouth at bedtime.     omeprazole (PRILOSEC) 20 MG capsule TAKE 1 CAPSULE BY MOUTH ONCE DAILY 30 capsule 5   Oxycodone HCl 10 MG TABS Take 1 tablet (10 mg total) by mouth every 8 (eight) hours as needed. for pain 120 tablet 0   tamsulosin (FLOMAX) 0.4 MG CAPS capsule TAKE 1 CAPSULE BY MOUTH EVERY NIGHT AT BEDTIME 30 capsule 0   temazepam (RESTORIL) 15 MG capsule TAKE 1 CAPSULE BY MOUTH AT BEDTIME AS NEEDED FOR SLEEP 30 capsule 0   zolendronic acid (ZOMETA) 4 MG/5ML injection Inject 4 mg into the vein every 30 (thirty) days.      ALPRAZolam (XANAX) 1 MG tablet TAKE 2 TABELETS BY MOUTH AT BEDTIME AS NEEDED FOR ANXIETY OR SLEEP (Patient not taking: Reported on 11/09/2018) 60 tablet 0   amoxicillin-clavulanate (AUGMENTIN) 875-125 MG tablet Take 1 tablet by mouth 2 (two) times daily. (Patient not taking: Reported on 11/09/2018) 20 tablet 0   No current facility-administered medications on file prior to visit.    Allergies  Allergen Reactions   Codeine Other (See  Comments)    Headache   Morphine And Related Other (See Comments)    Makes him feel weird   Revlimid [Lenalidomide] Other (See Comments)    "Causes me to become weak"   Social History   Socioeconomic History   Marital status: Married    Spouse name: Not on file   Number of children: 3   Years of education: Not on file   Highest education level: Not on file  Occupational History    Employer: RETIRED  Social Needs   Financial resource strain: Not on file   Food insecurity    Worry: Not  on file    Inability: Not on file   Transportation needs    Medical: Not on file    Non-medical: Not on file  Tobacco Use   Smoking status: Never Smoker   Smokeless tobacco: Never Used  Substance and Sexual Activity   Alcohol use: No    Comment: " Not much no more"   Drug use: No   Sexual activity: Not on file  Lifestyle   Physical activity    Days per week: Not on file    Minutes per session: Not on file   Stress: Not on file  Relationships   Social connections    Talks on phone: Not on file    Gets together: Not on file    Attends religious service: Not on file    Active member of club or organization: Not on file    Attends meetings of clubs or organizations: Not on file    Relationship status: Not on file   Intimate partner violence    Fear of current or ex partner: Not on file    Emotionally abused: Not on file    Physically abused: Not on file    Forced sexual activity: Not on file  Other Topics Concern   Not on file  Social History Narrative   Not on file      Review of Systems     Objective:   Physical Exam Vitals signs reviewed.  Constitutional:      General: He is not in acute distress.    Appearance: He is obese. He is not ill-appearing or toxic-appearing.  Cardiovascular:     Rate and Rhythm: Normal rate and regular rhythm.     Heart sounds: Normal heart sounds.  Pulmonary:     Effort: Pulmonary effort is normal. No respiratory  distress.     Breath sounds: Normal breath sounds. No wheezing, rhonchi or rales.  Neurological:     General: No focal deficit present.     Mental Status: He is alert and oriented to person, place, and time.     Cranial Nerves: No cranial nerve deficit.     Motor: No weakness.           Assessment & Plan:  Hot flashes - Plan: TSH, Testosterone  Insomnia, unspecified type - Plan: ALPRAZolam (XANAX) 1 MG tablet  Multiple myeloma, remission status unspecified ([place]) - Plan: ALPRAZolam (XANAX) 1 MG tablet  Urinary frequency  Analgesic rebound headache  I believe that his hot flashes are most likely due to his multiple myeloma.  I explained that malignancies particularly bone marrow malignancies can certainly cause hot flashes.  However I would also rule out hypogonadism checking a testosterone level and also check for hyperthyroidism by checking a TSH.  I will be glad to refill the patient's Xanax which he takes for insomnia.  I encouraged him to take 1 tablet at night to help him sleep.  I will give him 60 tablets hopefully this will last 1 to 2 months.  He also uses the medication occasionally for anxiety.  I believe his urinary frequency is likely due to prostatitis.  I have recommended that he not take the Augmentin that I called in yesterday but instead take Cipro 500 mg p.o. twice daily for 10 days.  I believe his headaches are rebound analgesic headaches secondary to [facility] powders.  I recommended complete discontinuation from this.  In the meantime he can use tizanidine 4 mg every 8 hours as needed for headache.  I anticipate that the headaches will likely worsen over the next 1 to 2 weeks and then slowly resolve as his body detoxifies from the [facility] powders.

## 2018-11-10 ENCOUNTER — Other Ambulatory Visit: Payer: Self-pay | Admitting: Family Medicine

## 2018-11-10 LAB — TESTOSTERONE: Testosterone: 473 ng/dL (ref 250–827)

## 2018-11-10 LAB — TSH: TSH: 0.38 mIU/L — ABNORMAL LOW (ref 0.40–4.50)

## 2018-11-10 MED ORDER — AMOXICILLIN-POT CLAVULANATE 875-125 MG PO TABS: 1.0000 | ORAL_TABLET | Freq: Two times a day (BID) | ORAL | 0 refills | Status: DC

## 2018-11-19 ENCOUNTER — Other Ambulatory Visit: Payer: Self-pay

## 2018-11-22 ENCOUNTER — Inpatient Hospital Stay (HOSPITAL_COMMUNITY): Payer: Medicare Other | Attending: Hematology

## 2018-11-22 ENCOUNTER — Other Ambulatory Visit: Payer: Self-pay

## 2018-11-22 ENCOUNTER — Inpatient Hospital Stay (HOSPITAL_COMMUNITY): Payer: Medicare Other

## 2018-11-22 ENCOUNTER — Inpatient Hospital Stay (HOSPITAL_BASED_OUTPATIENT_CLINIC_OR_DEPARTMENT_OTHER): Payer: Medicare Other | Admitting: Hematology

## 2018-11-22 VITALS — BP 149/83 | HR 84 | Temp 97.1°F | Resp 18 | Wt 230.0 lb

## 2018-11-22 DIAGNOSIS — C9000 Multiple myeloma not having achieved remission: Secondary | ICD-10-CM | POA: Insufficient documentation

## 2018-11-22 DIAGNOSIS — Z5112 Encounter for antineoplastic immunotherapy: Secondary | ICD-10-CM | POA: Diagnosis not present

## 2018-11-22 LAB — CBC WITH DIFFERENTIAL/PLATELET
Abs Immature Granulocytes: 0.01 10*3/uL (ref 0.00–0.07)
Basophils Absolute: 0 10*3/uL (ref 0.0–0.1)
Basophils Relative: 0 %
Eosinophils Absolute: 0 10*3/uL (ref 0.0–0.5)
Eosinophils Relative: 0 %
HCT: 45.1 % (ref 39.0–52.0)
Hemoglobin: 14.6 g/dL (ref 13.0–17.0)
Immature Granulocytes: 0 %
Lymphocytes Relative: 29 %
Lymphs Abs: 1.3 10*3/uL (ref 0.7–4.0)
MCH: 29.9 pg (ref 26.0–34.0)
MCHC: 32.4 g/dL (ref 30.0–36.0)
MCV: 92.4 fL (ref 80.0–100.0)
Monocytes Absolute: 0.5 10*3/uL (ref 0.1–1.0)
Monocytes Relative: 12 %
Neutro Abs: 2.6 10*3/uL (ref 1.7–7.7)
Neutrophils Relative %: 59 %
Platelets: 149 10*3/uL — ABNORMAL LOW (ref 150–400)
RBC: 4.88 MIL/uL (ref 4.22–5.81)
RDW: 13.9 % (ref 11.5–15.5)
WBC: 4.5 10*3/uL (ref 4.0–10.5)
nRBC: 0 % (ref 0.0–0.2)

## 2018-11-22 LAB — COMPREHENSIVE METABOLIC PANEL
ALT: 15 U/L (ref 0–44)
AST: 15 U/L (ref 15–41)
Albumin: 3.6 g/dL (ref 3.5–5.0)
Alkaline Phosphatase: 20 U/L — ABNORMAL LOW (ref 38–126)
Anion gap: 7 (ref 5–15)
BUN: 16 mg/dL (ref 8–23)
CO2: 27 mmol/L (ref 22–32)
Calcium: 9 mg/dL (ref 8.9–10.3)
Chloride: 108 mmol/L (ref 98–111)
Creatinine, Ser: 0.67 mg/dL (ref 0.61–1.24)
GFR calc Af Amer: >60 mL/min (ref >60)
GFR calc non Af Amer: >60 mL/min (ref >60)
Glucose, Bld: 102 mg/dL — ABNORMAL HIGH (ref 70–99)
Potassium: 4.2 mmol/L (ref 3.5–5.1)
Sodium: 142 mmol/L (ref 135–145)
Total Bilirubin: 0.4 mg/dL (ref 0.3–1.2)
Total Protein: 5.6 g/dL — ABNORMAL LOW (ref 6.5–8.1)

## 2018-11-22 MED ORDER — SODIUM CHLORIDE 0.9 % IV SOLN
Freq: Once | INTRAVENOUS | Status: AC
  Administered 2018-11-22: 09:00:00 via INTRAVENOUS

## 2018-11-22 MED ORDER — DIPHENHYDRAMINE HCL 25 MG PO CAPS
50.0000 mg | ORAL_CAPSULE | Freq: Once | ORAL | Status: AC
  Administered 2018-11-22: 50 mg via ORAL
  Filled 2018-11-22: qty 2

## 2018-11-22 MED ORDER — SODIUM CHLORIDE 0.9 % IV SOLN
20.0000 mg | Freq: Once | INTRAVENOUS | Status: AC
  Administered 2018-11-22: 20 mg via INTRAVENOUS
  Filled 2018-11-22: qty 20

## 2018-11-22 MED ORDER — ACETAMINOPHEN 325 MG PO TABS
650.0000 mg | ORAL_TABLET | Freq: Once | ORAL | Status: AC
  Administered 2018-11-22: 650 mg via ORAL
  Filled 2018-11-22: qty 2

## 2018-11-22 MED ORDER — ZOLEDRONIC ACID 4 MG/100ML IV SOLN
4.0000 mg | Freq: Once | INTRAVENOUS | Status: AC
  Administered 2018-11-22: 4 mg via INTRAVENOUS
  Filled 2018-11-22: qty 100

## 2018-11-22 MED ORDER — DARATUMUMAB-HYALURONIDASE-FIHJ 1800-30000 MG-UT/15ML ~~LOC~~ SOLN
1800.0000 mg | Freq: Once | SUBCUTANEOUS | Status: AC
  Administered 2018-11-22: 1800 mg via SUBCUTANEOUS
  Filled 2018-11-22: qty 15

## 2018-11-22 MED ORDER — PROCHLORPERAZINE MALEATE 10 MG PO TABS
10.0000 mg | ORAL_TABLET | Freq: Once | ORAL | Status: AC
  Administered 2018-11-22: 10 mg via ORAL
  Filled 2018-11-22: qty 1

## 2018-11-22 MED ORDER — HEPARIN SOD (PORK) LOCK FLUSH 100 UNIT/ML IV SOLN
500.0000 [IU] | Freq: Once | INTRAVENOUS | Status: AC | PRN
  Administered 2018-11-22: 500 [IU]

## 2018-11-22 MED ORDER — SODIUM CHLORIDE 0.9% FLUSH
10.0000 mL | INTRAVENOUS | Status: DC | PRN
  Administered 2018-11-22: 10 mL
  Filled 2018-11-22: qty 10

## 2018-11-22 NOTE — Progress Notes (Signed)
Dean [name], Dean [name] [id number]   CLINIC:  Medical Oncology/Hematology  PCP:  [name], MD [address] [place] [address] [phone]   REASON FOR VISIT:  Follow-up for Multiple Myeloma  CURRENT THERAPY: Dara  BRIEF ONCOLOGIC HISTORY:  Oncology History  Multiple myeloma without remission ([place])  11/16/2012 Initial Diagnosis   L occipital condyle destructive lesion, not biopsied secondary to location. XRT   12/01/2012 Imaging   L3 superior endplate compression FX, no lytic lesions to suggest myeloma   02/06/2014 Imaging   soft tissue mass along the right posterior fifth rib with some rib destruction   02/16/2014 Miscellaneous   Normal CBC, Normal CMP, kappa,lamda ratio of 2.62 (abnormal), UIEP with slightly restricted band, IgG kappa, SPEP/IEP with monoclonal protein at 0.79 g/dl, normal igG, suppressed IGM   02/20/2014 Bone Marrow Biopsy   33% kappa restricted plasma cells Normal FISH, normal cytogenetics   02/21/2014 - 03/08/2014 Radiation Therapy   30Gy to R rib lesion, lesion not biopsied   03/16/2014 PET scan   Scattered hypermetabolic osseous lesions, including the calvarium, ribs, left scapula, sacrum, and left femoral shaft. An expansile left vertebral body lesion at L3 extends into the epidural space of the left lateral recess,    03/17/2014 - 04/07/2014 Chemotherapy   Velcade and Dexamethasone due to initial denial of Revlimid by insurance company.  Zometa monthly.   03/22/2014 Imaging   MR_ L-Spine- Enhancing lesions compatible with multiple myeloma at L2, L3, and S1-2.   04/07/2014 - 07/28/2014 Chemotherapy   RVD with Revlimid at 25 mg days 1-14.  Revlimid dose reduced to 25 mg every other day x 14 days with 7 day respite beginning on day 1 of cycle 2.   04/17/2014 Adverse Reaction   Revlimid-induced rash.  Treated with steroids.   07/28/2014 - 08/04/2014 Chemotherapy   Revlimid daily   08/04/2014 Adverse  Reaction   Velcade-induced peripheral neuropathy   08/04/2014 Treatment Plan Change   D/C Velcade   08/11/2014 PET scan   Response to therapy. Improvement and resolution of foci of osseous hypermetabolism.   08/22/2014 - 08/28/2014 Chemotherapy   Revlimid 25 mg days 1-21 every 28 days   08/28/2014 Adverse Reaction   Revlimid-induced rash. Medication held.  Medrol dose Pak prescribed.   09/04/2014 - 01/08/2015 Chemotherapy   Revlimid 10 mg every other day, without dexamethasone   01/08/2015 - 01/15/2015 Hospital Admission   Colonic diverticular abscess with IR drain placement   03/02/2015 PET scan   Only bony uptake is low activity at site of deformity/callus at R second rib FX, high activity in sigmoid colon, advanced sigmoid divertidulosis. Abscess not resolved?   06/04/2015 Imaging   CT nonobstructive L renal calculus, diverticulosis of descending and sigmoid colon without inflammation, moderate prostatic enlargement   07/12/2015 Surgery   Diverticulitis s/p robotic sigmoid colectomy with Dr. Johney Maine   08/23/2015 PET scan   Primarily similar hypermetabolic osseous foci within the R sided ribs. new L third rib focus of hypermetab and sclerosis is favored to be related to healing FX. no soft tissue myeloma id'd. Presumed postop hypermetab and edema about the R pelvic wall    03/06/2016 PET scan   1. Reduced activity in the prior lesion such as the right second and fifth rib lesions, activity nearly completely resolved and significantly less than added mediastinal blood pool activity. No new lesions are identified. 2. Other imaging findings of potential clinical significance: Coronary,  aortic arch, and branch vessel atherosclerotic vascular disease. Aortoiliac atherosclerotic vascular disease. Enlarged prostate gland. Colonic diverticula.   02/17/2017 PET scan   HEAD/NECK: No hypermetabolic activity in the scalp. No hypermetabolic cervical lymph nodes. CHEST: No hypermetabolic mediastinal or  hilar nodes. Right upper lobe scarring/atelectasis. No suspicious pulmonary nodules on the CT scan. ABDOMEN/PELVIS: No abnormal hypermetabolic activity within the liver, pancreas, adrenal glands, or spleen. No hypermetabolic lymph nodes in the abdomen or pelvis. Atherosclerotic calcifications of the abdominal aorta and branch vessels. Left renal sinus cysts. Sigmoid diverticulosis, without evidence of diverticulitis. Prostatomegaly. SKELETON: Vague hypermetabolism involving the inferior sternum, max SUV 3.2, previously 8.2. Focal hypermetabolism involving the left sacrum, max SUV 4.0, previously 6.7. EXTREMITIES: No abnormal hypermetabolic activity in the lower extremities.   02/18/2017 -  Chemotherapy   Bortezomib 1.70m/m2 QWk + Dexamethasone 174mQTue/Fri --Cycle #1, 02/18/17    02/18/2017 - 08/26/2017 Chemotherapy   The patient had bortezomib SQ (VELCADE) chemo injection 3 mg, 1.3 mg/m2 = 3 mg, Subcutaneous,  Once, 7 of 9 cycles Administration: 3 mg (02/18/2017), 3 mg (02/26/2017), 3 mg (03/04/2017), 3 mg (03/11/2017), 3 mg (04/08/2017), 3 mg (03/18/2017), 3 mg (03/25/2017), 3 mg (04/01/2017), 3 mg (05/06/2017), 3 mg (05/20/2017), 3 mg (06/03/2017), 3 mg (06/17/2017), 3 mg (07/01/2017), 3 mg (07/15/2017), 3 mg (07/29/2017), 3 mg (08/12/2017), 3 mg (08/26/2017)  for chemotherapy treatment.    09/17/2017 -  Chemotherapy   The patient had daratumumab-hyaluronidase-fihj (DARZALEX FASPRO) 1800-30000 MG-UT/15ML chemo SQ injection 1,800 mg, 1,800 mg, Subcutaneous,  Once, 2 of 3 cycles Administration: 1,800 mg (10/25/2018), 1,800 mg (11/22/2018) daratumumab (DARZALEX) 1,800 mg in sodium chloride 0.9 % 910 mL (1.8 mg/mL) chemo infusion, 15.9 mg/kg = 1,820 mg, Intravenous, Once, 1 of 1 cycle Administration: 1,800 mg (09/17/2017) daratumumab (DARZALEX) 1,800 mg in sodium chloride 0.9 % 410 mL (3.6 mg/mL) chemo infusion, 1,820 mg, Intravenous, Once, 13 of 13 cycles Administration: 1,800 mg (10/28/2017), 1,800 mg (11/04/2017), 1,800  mg (11/11/2017), 1,800 mg (11/18/2017), 1,800 mg (11/25/2017), 1,800 mg (12/02/2017), 1,800 mg (12/09/2017), 1,800 mg (12/16/2017), 1,800 mg (12/30/2017), 1,800 mg (04/26/2018), 1,800 mg (01/13/2018), 1,800 mg (02/12/2018), 1,800 mg (02/26/2018), 1,800 mg (03/15/2018), 1,800 mg (03/29/2018), 1,800 mg (04/12/2018), 1,800 mg (05/10/2018), 1,800 mg (06/07/2018), 1,800 mg (07/05/2018), 1,800 mg (08/02/2018), 1,800 mg (08/30/2018), 1,800 mg (09/27/2018)  for chemotherapy treatment.        INTERVAL HISTORY:  Mr. HeLebarron89.o. male presents today for follow-up.  He reports overall doing well.  He denies any significant fatigue.  Denies any change in bowel habits.  Appetite is stable.  Denies any fevers, chills, night sweats.  He states he is ready to proceed with treatment today.   REVIEW OF SYSTEMS:  Review of Systems  Constitutional: Negative.   HENT:  Negative.   Eyes: Negative.   Respiratory: Negative.   Cardiovascular: Negative.   Gastrointestinal: Negative.   Endocrine: Negative.   Genitourinary: Negative.    Musculoskeletal: Positive for arthralgias and myalgias.  Skin: Negative.   Neurological: Negative.   Hematological: Negative.   Psychiatric/Behavioral: Negative.      PAST MEDICAL/SURGICAL HISTORY:  Past Medical History:  Diagnosis Date  . BPH (benign prostatic hyperplasia)   . Colonic diverticular abscess 01/08/2015  . Diverticulitis 1137/34/28 complicated by abscess and required percutaneous drainage  . GERD (gastroesophageal reflux disease)   . H/O ETOH abuse   . History of chemotherapy last done jan 2017  . History of radiation therapy 01/05/13-02/10/13   45 gray to left occipital  condyle region  . History of radiation therapy 12/01/16-12/10/16   Parasternal nodule, chest- 24 Gy total delivered in 8 fractions, Left sacro-iliac, pelvis- 24 Gy total delivered in 8 fractions   . History of radiation therapy 02/19/17-02/22/17   right temporal scalp 30 Gy in 10 fractions  . Intra-abdominal  abscess ([place])   . Multiple myeloma ([place]) 2015  . Neuropathy   . Radiation 02/21/14-03/08/14   right posterior chest wall area 30 gray  . Radiation 04/19/14-05/02/14   lumbar spine 25 gray  . Skull lesion    Left occipital condyle  . Wrist fracture, left    x 2   Past Surgical History:  Procedure Laterality Date  . COLONOSCOPY N/A 04/19/2015   Procedure: COLONOSCOPY;  Surgeon: Danie Binder, MD;  Location: [facility];  Service: Endoscopy;  Laterality: N/A;  1:30 PM  . CYST EXCISION  1959   tail bone  . IR IMAGING GUIDED PORT INSERTION  09/25/2017     SOCIAL HISTORY:  Social History   Socioeconomic History  . Marital status: Married    Spouse name: Not on file  . Number of children: 3  . Years of education: Not on file  . Highest education level: Not on file  Occupational History    Employer: RETIRED  Social Needs  . Financial resource strain: Not on file  . Food insecurity    Worry: Not on file    Inability: Not on file  . Transportation needs    Medical: Not on file    Non-medical: Not on file  Tobacco Use  . Smoking status: Never Smoker  . Smokeless tobacco: Never Used  Substance and Sexual Activity  . Alcohol use: No    Comment: " Not much no more"  . Drug use: No  . Sexual activity: Not on file  Lifestyle  . Physical activity    Days per week: Not on file    Minutes per session: Not on file  . Stress: Not on file  Relationships  . Social Herbalist on phone: Not on file    Gets together: Not on file    Attends religious service: Not on file    Active member of club or organization: Not on file    Attends meetings of clubs or organizations: Not on file    Relationship status: Not on file  . Intimate partner violence    Fear of current or ex partner: Not on file    Emotionally abused: Not on file    Physically abused: Not on file    Forced sexual activity: Not on file  Other Topics Concern  . Not on file  Social History Narrative  . Not on  file    FAMILY HISTORY:  Family History  Problem Relation Age of Onset  . Diabetes Mother   . Stroke Mother   . Hypertension Father     CURRENT MEDICATIONS:  Outpatient Encounter Medications as of 11/22/2018  Medication Sig Note  . acyclovir (ZOVIRAX) 400 MG tablet TAKE 1 TABLET BY MOUTH 2 TIMES DAILY   . ALPRAZolam (XANAX) 1 MG tablet TAKE 2 TABELETS BY MOUTH AT BEDTIME AS NEEDED FOR ANXIETY OR SLEEP   . amoxicillin-clavulanate (AUGMENTIN) 875-125 MG tablet Take 1 tablet by mouth 2 (two) times daily. (Patient not taking: Reported on 11/22/2018)   . aspirin EC 325 MG tablet Take 1 tablet (325 mg total) by mouth daily.   Marland Kitchen CALCIUM-VITAMIN D PO Take 1 tablet  by mouth 2 (two) times daily.    . ciprofloxacin (CIPRO) 500 MG tablet Take 1 tablet (500 mg total) by mouth 2 (two) times daily. (Patient not taking: Reported on 11/22/2018)   . dexamethasone (DECADRON) 4 MG tablet Take 2.5 tablets (10 mg total) by mouth once a week. Take 10 mg once weekly 09/18/2017: Started on 09/18/17  . furosemide (LASIX) 20 MG tablet TAKE 1 TABLET BY MOUTH DAILY AS NEEDED FOR EDEMA.   Marland Kitchen gabapentin (NEURONTIN) 400 MG capsule Take 1 capsule (400 mg total) by mouth 3 (three) times daily.   Marland Kitchen lidocaine-prilocaine (EMLA) cream Apply small amount over port one (1) hour prior to appointment.   Marland Kitchen MAGNESIUM PO Take 1 tablet by mouth at bedtime.   Marland Kitchen omeprazole (PRILOSEC) 20 MG capsule TAKE 1 CAPSULE BY MOUTH ONCE DAILY   . Oxycodone HCl 10 MG TABS Take 1 tablet (10 mg total) by mouth every 8 (eight) hours as needed. for pain   . tamsulosin (FLOMAX) 0.4 MG CAPS capsule TAKE 1 CAPSULE BY MOUTH EVERY NIGHT AT BEDTIME   . tiZANidine (ZANAFLEX) 4 MG tablet Take 1 tablet (4 mg total) by mouth every 6 (six) hours as needed (headache).   . zolendronic acid (ZOMETA) 4 MG/5ML injection Inject 4 mg into the vein every 30 (thirty) days.     Facility-Administered Encounter Medications as of 11/22/2018  Medication  . [COMPLETED] 0.9 %   sodium chloride infusion  . [COMPLETED] acetaminophen (TYLENOL) tablet 650 mg  . [COMPLETED] daratumumab-hyaluronidase-fihj (DARZALEX FASPRO) 1800-30000 MG-UT/15ML chemo SQ injection 1,800 mg  . [COMPLETED] dexamethasone (DECADRON) 20 mg in sodium chloride 0.9 % 50 mL IVPB  . [COMPLETED] diphenhydrAMINE (BENADRYL) capsule 50 mg  . [COMPLETED] heparin lock flush 100 unit/mL  . [COMPLETED] prochlorperazine (COMPAZINE) tablet 10 mg  . sodium chloride flush (NS) 0.9 % injection 10 mL  . [COMPLETED] Zoledronic Acid (ZOMETA) IVPB 4 mg    ALLERGIES:  Allergies  Allergen Reactions  . Codeine Other (See Comments)    Headache  . Morphine And Related Other (See Comments)    Makes him feel weird  . Revlimid [Lenalidomide] Other (See Comments)    "Causes me to become weak"     PHYSICAL EXAM:  ECOG Performance status: 2  There were no vitals filed for this visit. There were no vitals filed for this visit.  Physical Exam Constitutional:      Appearance: Normal appearance.  HENT:     Head: Normocephalic.     Right Ear: Tympanic membrane normal.     Left Ear: Tympanic membrane normal.     Nose: Nose normal.     Mouth/Throat:     Mouth: Mucous membranes are moist.  Eyes:     Extraocular Movements: Extraocular movements intact.     Conjunctiva/sclera: Conjunctivae normal.  Neck:     Musculoskeletal: Normal range of motion.  Cardiovascular:     Rate and Rhythm: Normal rate and regular rhythm.  Pulmonary:     Effort: Pulmonary effort is normal.     Breath sounds: Normal breath sounds.  Abdominal:     General: Bowel sounds are normal.  Musculoskeletal: Normal range of motion.  Skin:    General: Skin is warm.  Neurological:     General: No focal deficit present.     Mental Status: He is alert and oriented to person, place, and time.  Psychiatric:        Mood and Affect: Mood normal.  Behavior: Behavior normal.        Thought Content: Thought content normal.         Judgment: Judgment normal.      LABORATORY DATA:  I have reviewed the labs as listed.  CBC    Component Value Date/Time   WBC 4.5 11/22/2018 0814   RBC 4.88 11/22/2018 0814   HGB 14.6 11/22/2018 0814   HCT 45.1 11/22/2018 0814   PLT 149 (L) 11/22/2018 0814   MCV 92.4 11/22/2018 0814   MCH 29.9 11/22/2018 0814   MCHC 32.4 11/22/2018 0814   RDW 13.9 11/22/2018 0814   LYMPHSABS 1.3 11/22/2018 0814   MONOABS 0.5 11/22/2018 0814   EOSABS 0.0 11/22/2018 0814   BASOSABS 0.0 11/22/2018 0814   CMP Latest Ref Rng & Units 11/22/2018 10/25/2018 09/27/2018  Glucose 70 - 99 mg/dL 102(H) 98 96  BUN 8 - 23 mg/dL 16 13 16   Creatinine 0.61 - 1.24 mg/dL 0.67 0.78 0.76  Sodium 135 - 145 mmol/L 142 141 140  Potassium 3.5 - 5.1 mmol/L 4.2 3.9 4.2  Chloride 98 - 111 mmol/L 108 105 109  CO2 22 - 32 mmol/L 27 27 26   Calcium 8.9 - 10.3 mg/dL 9.0 9.0 8.3(L)  Total Protein 6.5 - 8.1 g/dL 5.6(L) 5.9(L) 5.4(L)  Total Bilirubin 0.3 - 1.2 mg/dL 0.4 0.7 0.6  Alkaline Phos 38 - 126 U/L 20(L) 25(L) 24(L)  AST 15 - 41 U/L 15 16 16   ALT 0 - 44 U/L 15 14 12           ASSESSMENT & PLAN:   Multiple myeloma without remission ([facility]) 1.  Recurrent IgG kappa multiple myeloma: - RVD from 04/07/2014 through 07/28/2014 followed by maintenance Revlimid, which was discontinued. - MRI of the brain on 02/09/2017 showed new right temporal lesion, status post radiation therapy, PET CT in January 2019 showed improvement of multiple myeloma with no new lesions, started on weekly Velcade and dexamethasone on 02/18/2017, status post 2 cycles, Velcade changed every other week on 05/06/2017 secondary to neuropathy. - MRI of the thoracic and lumbar spine on 09/03/2017 showed new rib lesion close to the spine on the left side, possibly eighth rib.  There was soft tissue mass in that area.  There is chronic L4 compression fracture which is stable. -Status post XRT to the left eighth rib lesion, 30 Gy in 10 fractions from 09/29/2017 through  10/14/2017. - DPD regimen started on 09/17/2017, developed erythematous maculopapular rash involving the trunk after taking 3 pills of pomalidomide. - Daratumumab restarted after radiation on 10/28/2017, pomalidomide completely discontinued on 02/12/2018 for recurrent rash. - He is continuing daratumumab every 4 weeks very well.  He is taking Decadron 10 mg weekly. -He was started on SubQ Dara on 10/25/18.   -He is tolerating subcu Dara well without any adverse reactions.  He will continue on current regimen.  Plan to repeat multiple myeloma labs prior to his next visit. -Return to clinic in 4 weeks  2.  Peripheral neuropathy: -We will continue gabapentin 3 times daily.  3.  Bone protection: -He is receiving Zometa every 8 weeks.  4.  Low back pain: -He is taking oxycodone 10 mg 3 tablets daily.  He reports worsening of pain. -He reports the pain relief lasting only 3 hours. -He was told to take oxycodone every 4-6 hours as needed.  5.  Difficulty falling asleep: - Currently on Ambien PRN      Orders placed this encounter:  Orders Placed This Encounter  Procedures  . CBC with Differential  . Comprehensive metabolic panel  . Multiple Myeloma Panel (SPEP&IFE w/QIG)  . Kappa/lambda light chains     [place] [phone]

## 2018-11-22 NOTE — Patient Instructions (Signed)
[facility] Discharge Instructions for Patients Receiving Chemotherapy   Beginning January 23rd 2017 lab work for the [facility] will be done in the  Main lab at [facility] on 1st floor. If you have a lab appointment with the [facility] please come in thru the  Main Entrance and check in at the main information desk   Today you received the following chemotherapy agents Daratumumab and Zometa  To help prevent nausea and vomiting after your treatment, we encourage you to take your nausea medication    If you develop nausea and vomiting, or diarrhea that is not controlled by your medication, call the clinic.  The clinic phone number is [phone]. Office hours are Monday-Friday 8:30am-5:00pm.  BELOW ARE SYMPTOMS THAT SHOULD BE REPORTED IMMEDIATELY:  *FEVER GREATER THAN 101.0 F  *CHILLS WITH OR WITHOUT FEVER  NAUSEA AND VOMITING THAT IS NOT CONTROLLED WITH YOUR NAUSEA MEDICATION  *UNUSUAL SHORTNESS OF BREATH  *UNUSUAL BRUISING OR BLEEDING  TENDERNESS IN MOUTH AND THROAT WITH OR WITHOUT PRESENCE OF ULCERS  *URINARY PROBLEMS  *BOWEL PROBLEMS  UNUSUAL RASH Items with * indicate a potential emergency and should be followed up as soon as possible. If you have an emergency after office hours please contact your primary care physician or go to the nearest emergency department.  Please call the clinic during office hours if you have any questions or concerns.   You may also contact the Patient Navigator at [phone] should you have any questions or need assistance in obtaining follow up care.      Resources For Cancer Patients and their Caregivers ? American Cancer Society: Can assist with transportation, wigs, general needs, runs Look Good Feel Better.        860-249-1547 ? Cancer Care: Provides financial assistance, online support groups, medication/co-pay assistance.  1-800-813-HOPE 586-840-7242) ? [place] Assists  [place] cancer patients and their families through emotional , educational and financial support.  281-755-9797 ? [place] DSS Where to apply for food stamps, Medicaid and utility assistance. 765-851-1117 ? [facility]: Transportation to medical appointments. 757-826-6436 ? Social Security Administration: May apply for disability if have a Stage IV cancer. 210-181-1939 260-554-1095 ? [employer], Disability and Transit Services: Assists with nutrition, care and transit needs. [phone]

## 2018-11-22 NOTE — Progress Notes (Signed)
Dean [name]. tolerated SQ dara and Zometa without incident or complaint. VSS. Pt observed for 20 minutes post injection per Reynolds Bowl, NP VO. Port deaccessed and pt discharged in satisfactory condition.

## 2018-11-22 NOTE — Assessment & Plan Note (Signed)
1.  Recurrent IgG kappa multiple myeloma: - RVD from 04/07/2014 through 07/28/2014 followed by maintenance Revlimid, which was discontinued. - MRI of the brain on 02/09/2017 showed new right temporal lesion, status post radiation therapy, PET CT in January 2019 showed improvement of multiple myeloma with no new lesions, started on weekly Velcade and dexamethasone on 02/18/2017, status post 2 cycles, Velcade changed every other week on 05/06/2017 secondary to neuropathy. - MRI of the thoracic and lumbar spine on 09/03/2017 showed new rib lesion close to the spine on the left side, possibly eighth rib.  There was soft tissue mass in that area.  There is chronic L4 compression fracture which is stable. -Status post XRT to the left eighth rib lesion, 30 Gy in 10 fractions from 09/29/2017 through 10/14/2017. - DPD regimen started on 09/17/2017, developed erythematous maculopapular rash involving the trunk after taking 3 pills of pomalidomide. - Daratumumab restarted after radiation on 10/28/2017, pomalidomide completely discontinued on 02/12/2018 for recurrent rash. - He is continuing daratumumab every 4 weeks very well.  He is taking Decadron 10 mg weekly. -He was started on SubQ Dara on 10/25/18.   -He is tolerating subcu Dara well without any adverse reactions.  He will continue on current regimen.  Plan to repeat multiple myeloma labs prior to his next visit. -Return to clinic in 4 weeks  2.  Peripheral neuropathy: -We will continue gabapentin 3 times daily.  3.  Bone protection: -He is receiving Zometa every 8 weeks.  4.  Low back pain: -He is taking oxycodone 10 mg 3 tablets daily.  He reports worsening of pain. -He reports the pain relief lasting only 3 hours. -He was told to take oxycodone every 4-6 hours as needed.  5.  Difficulty falling asleep: - Currently on Ambien PRN

## 2018-11-22 NOTE — Patient Instructions (Signed)
[facility] at [facility] Discharge Instructions  Labs drawn from portacath today   Thank you for choosing [facility] at [facility] to provide your oncology and hematology care.  To afford each patient quality time with our provider, please arrive at least 15 minutes before your scheduled appointment time.   If you have a lab appointment with the Cancer Center please come in thru the Main Entrance and check in at the main information desk.  You need to re-schedule your appointment should you arrive 10 or more minutes late.  We strive to give you quality time with our providers, and arriving late affects you and other patients whose appointments are after yours.  Also, if you no show three or more times for appointments you may be dismissed from the clinic at the providers discretion.     Again, thank you for choosing [facility].  Our hope is that these requests will decrease the amount of time that you wait before being seen by our physicians.       _____________________________________________________________  Should you have questions after your visit to [facility], please contact our office at [phone] between the hours of 8:00 a.m. and 4:30 p.m.  Voicemails left after 4:00 p.m. will not be returned until the following business day.  For prescription refill requests, have your pharmacy contact our office and allow 72 hours.    Due to Covid, you will need to wear a mask upon entering the hospital. If you do not have a mask, a mask will be given to you at the Main Entrance upon arrival. For doctor visits, patients may have 1 support person with them. For treatment visits, patients can not have anyone with them due to social distancing guidelines and our immunocompromised population.     

## 2018-11-25 ENCOUNTER — Other Ambulatory Visit (HOSPITAL_COMMUNITY): Payer: Self-pay | Admitting: Nurse Practitioner

## 2018-11-25 ENCOUNTER — Other Ambulatory Visit: Payer: Self-pay | Admitting: Family Medicine

## 2018-11-25 DIAGNOSIS — C9000 Multiple myeloma not having achieved remission: Secondary | ICD-10-CM

## 2018-11-25 DIAGNOSIS — K219 Gastro-esophageal reflux disease without esophagitis: Secondary | ICD-10-CM

## 2018-11-25 DIAGNOSIS — G629 Polyneuropathy, unspecified: Secondary | ICD-10-CM

## 2018-11-25 DIAGNOSIS — N400 Enlarged prostate without lower urinary tract symptoms: Secondary | ICD-10-CM

## 2018-11-30 ENCOUNTER — Inpatient Hospital Stay (HOSPITAL_BASED_OUTPATIENT_CLINIC_OR_DEPARTMENT_OTHER): Payer: Medicare Other | Admitting: Hematology

## 2018-11-30 ENCOUNTER — Other Ambulatory Visit: Payer: Self-pay

## 2018-11-30 ENCOUNTER — Ambulatory Visit (HOSPITAL_COMMUNITY)
Admission: RE | Admit: 2018-11-30 | Discharge: 2018-11-30 | Disposition: A | Discharge status: Home / Self Care | Payer: Medicare Other | Source: Ambulatory Visit | Attending: Hematology | Admitting: Hematology

## 2018-11-30 ENCOUNTER — Inpatient Hospital Stay (HOSPITAL_COMMUNITY): Payer: Medicare Other

## 2018-11-30 VITALS — BP 102/81 | HR 73 | Temp 97.3°F | Resp 16 | Wt 231.0 lb

## 2018-11-30 DIAGNOSIS — C9000 Multiple myeloma not having achieved remission: Secondary | ICD-10-CM | POA: Insufficient documentation

## 2018-11-30 DIAGNOSIS — Z5112 Encounter for antineoplastic immunotherapy: Secondary | ICD-10-CM | POA: Diagnosis not present

## 2018-11-30 LAB — CBC WITH DIFFERENTIAL/PLATELET
Abs Immature Granulocytes: 0.02 10*3/uL (ref 0.00–0.07)
Basophils Absolute: 0 10*3/uL (ref 0.0–0.1)
Basophils Relative: 0 %
Eosinophils Absolute: 0 10*3/uL (ref 0.0–0.5)
Eosinophils Relative: 0 %
HCT: 46.9 % (ref 39.0–52.0)
Hemoglobin: 15.1 g/dL (ref 13.0–17.0)
Immature Granulocytes: 0 %
Lymphocytes Relative: 28 %
Lymphs Abs: 1.7 10*3/uL (ref 0.7–4.0)
MCH: 30.1 pg (ref 26.0–34.0)
MCHC: 32.2 g/dL (ref 30.0–36.0)
MCV: 93.6 fL (ref 80.0–100.0)
Monocytes Absolute: 0.6 10*3/uL (ref 0.1–1.0)
Monocytes Relative: 10 %
Neutro Abs: 3.6 10*3/uL (ref 1.7–7.7)
Neutrophils Relative %: 62 %
Platelets: 123 10*3/uL — ABNORMAL LOW (ref 150–400)
RBC: 5.01 MIL/uL (ref 4.22–5.81)
RDW: 13.9 % (ref 11.5–15.5)
WBC: 6 10*3/uL (ref 4.0–10.5)
nRBC: 0 % (ref 0.0–0.2)

## 2018-11-30 LAB — COMPREHENSIVE METABOLIC PANEL
ALT: 15 U/L (ref 0–44)
AST: 15 U/L (ref 15–41)
Albumin: 3.7 g/dL (ref 3.5–5.0)
Alkaline Phosphatase: 23 U/L — ABNORMAL LOW (ref 38–126)
Anion gap: 6 (ref 5–15)
BUN: 21 mg/dL (ref 8–23)
CO2: 26 mmol/L (ref 22–32)
Calcium: 8.3 mg/dL — ABNORMAL LOW (ref 8.9–10.3)
Chloride: 108 mmol/L (ref 98–111)
Creatinine, Ser: 0.67 mg/dL (ref 0.61–1.24)
GFR calc Af Amer: >60 mL/min (ref >60)
GFR calc non Af Amer: >60 mL/min (ref >60)
Glucose, Bld: 101 mg/dL — ABNORMAL HIGH (ref 70–99)
Potassium: 4.1 mmol/L (ref 3.5–5.1)
Sodium: 140 mmol/L (ref 135–145)
Total Bilirubin: 0.7 mg/dL (ref 0.3–1.2)
Total Protein: 6 g/dL — ABNORMAL LOW (ref 6.5–8.1)

## 2018-11-30 MED ORDER — TRAZODONE HCL 100 MG PO TABS: 100.0000 mg | ORAL_TABLET | Freq: Every day | ORAL | 3 refills | Status: DC

## 2018-11-30 MED ORDER — SENNOSIDES-DOCUSATE SODIUM 8.6-50 MG PO TABS: 1.0000 | ORAL_TABLET | Freq: Two times a day (BID) | ORAL | 5 refills | Status: DC

## 2018-11-30 NOTE — Progress Notes (Signed)
[place], [place] [address]   CLINIC:  Medical Oncology/Hematology  PCP:  [name], MD [address] [place] [address] [phone]   REASON FOR VISIT:  Follow-up for Multiple Myeloma   CURRENT THERAPY:  Daratumumab   BRIEF ONCOLOGIC HISTORY:  Oncology History  Multiple myeloma without remission ([place])  11/16/2012 Initial Diagnosis   L occipital condyle destructive lesion, not biopsied secondary to location. XRT   12/01/2012 Imaging   L3 superior endplate compression FX, no lytic lesions to suggest myeloma   02/06/2014 Imaging   soft tissue mass along the right posterior fifth rib with some rib destruction   02/16/2014 Miscellaneous   Normal CBC, Normal CMP, kappa,lamda ratio of 2.62 (abnormal), UIEP with slightly restricted band, IgG kappa, SPEP/IEP with monoclonal protein at 0.79 g/dl, normal igG, suppressed IGM   02/20/2014 Bone Marrow Biopsy   33% kappa restricted plasma cells Normal FISH, normal cytogenetics   02/21/2014 - 03/08/2014 Radiation Therapy   30Gy to R rib lesion, lesion not biopsied   03/16/2014 PET scan   Scattered hypermetabolic osseous lesions, including the calvarium, ribs, left scapula, sacrum, and left femoral shaft. An expansile left vertebral body lesion at L3 extends into the epidural space of the left lateral recess,    03/17/2014 - 04/07/2014 Chemotherapy   Velcade and Dexamethasone due to initial denial of Revlimid by insurance company.  Zometa monthly.   03/22/2014 Imaging   MR_ L-Spine- Enhancing lesions compatible with multiple myeloma at L2, L3, and S1-2.   04/07/2014 - 07/28/2014 Chemotherapy   RVD with Revlimid at 25 mg days 1-14.  Revlimid dose reduced to 25 mg every other day x 14 days with 7 day respite beginning on day 1 of cycle 2.   04/17/2014 Adverse Reaction   Revlimid-induced rash.  Treated with steroids.   07/28/2014 - 08/04/2014 Chemotherapy   Revlimid daily   08/04/2014  Adverse Reaction   Velcade-induced peripheral neuropathy   08/04/2014 Treatment Plan Change   D/C Velcade   08/11/2014 PET scan   Response to therapy. Improvement and resolution of foci of osseous hypermetabolism.   08/22/2014 - 08/28/2014 Chemotherapy   Revlimid 25 mg days 1-21 every 28 days   08/28/2014 Adverse Reaction   Revlimid-induced rash. Medication held.  Medrol dose Pak prescribed.   09/04/2014 - 01/08/2015 Chemotherapy   Revlimid 10 mg every other day, without dexamethasone   01/08/2015 - 01/15/2015 Hospital Admission   Colonic diverticular abscess with IR drain placement   03/02/2015 PET scan   Only bony uptake is low activity at site of deformity/callus at R second rib FX, high activity in sigmoid colon, advanced sigmoid divertidulosis. Abscess not resolved?   06/04/2015 Imaging   CT nonobstructive L renal calculus, diverticulosis of descending and sigmoid colon without inflammation, moderate prostatic enlargement   07/12/2015 Surgery   Diverticulitis s/p robotic sigmoid colectomy with Dr. Johney Maine   08/23/2015 PET scan   Primarily similar hypermetabolic osseous foci within the R sided ribs. new L third rib focus of hypermetab and sclerosis is favored to be related to healing FX. no soft tissue myeloma id'd. Presumed postop hypermetab and edema about the R pelvic wall    03/06/2016 PET scan   1. Reduced activity in the prior lesion such as the right second and fifth rib lesions, activity nearly completely resolved and significantly less than added mediastinal blood pool activity. No new lesions are identified. 2. Other imaging findings of potential  clinical significance: Coronary, aortic arch, and branch vessel atherosclerotic vascular disease. Aortoiliac atherosclerotic vascular disease. Enlarged prostate gland. Colonic diverticula.   02/17/2017 PET scan   HEAD/NECK: No hypermetabolic activity in the scalp. No hypermetabolic cervical lymph nodes. CHEST: No hypermetabolic  mediastinal or hilar nodes. Right upper lobe scarring/atelectasis. No suspicious pulmonary nodules on the CT scan. ABDOMEN/PELVIS: No abnormal hypermetabolic activity within the liver, pancreas, adrenal glands, or spleen. No hypermetabolic lymph nodes in the abdomen or pelvis. Atherosclerotic calcifications of the abdominal aorta and branch vessels. Left renal sinus cysts. Sigmoid diverticulosis, without evidence of diverticulitis. Prostatomegaly. SKELETON: Vague hypermetabolism involving the inferior sternum, max SUV 3.2, previously 8.2. Focal hypermetabolism involving the left sacrum, max SUV 4.0, previously 6.7. EXTREMITIES: No abnormal hypermetabolic activity in the lower extremities.   02/18/2017 -  Chemotherapy   Bortezomib 1.50m/m2 QWk + Dexamethasone 154mQTue/Fri --Cycle #1, 02/18/17    02/18/2017 - 08/26/2017 Chemotherapy   The patient had bortezomib SQ (VELCADE) chemo injection 3 mg, 1.3 mg/m2 = 3 mg, Subcutaneous,  Once, 7 of 9 cycles Administration: 3 mg (02/18/2017), 3 mg (02/26/2017), 3 mg (03/04/2017), 3 mg (03/11/2017), 3 mg (04/08/2017), 3 mg (03/18/2017), 3 mg (03/25/2017), 3 mg (04/01/2017), 3 mg (05/06/2017), 3 mg (05/20/2017), 3 mg (06/03/2017), 3 mg (06/17/2017), 3 mg (07/01/2017), 3 mg (07/15/2017), 3 mg (07/29/2017), 3 mg (08/12/2017), 3 mg (08/26/2017)  for chemotherapy treatment.    09/17/2017 -  Chemotherapy   The patient had daratumumab-hyaluronidase-fihj (DARZALEX FASPRO) 1800-30000 MG-UT/15ML chemo SQ injection 1,800 mg, 1,800 mg, Subcutaneous,  Once, 2 of 3 cycles Administration: 1,800 mg (10/25/2018), 1,800 mg (11/22/2018) daratumumab (DARZALEX) 1,800 mg in sodium chloride 0.9 % 910 mL (1.8 mg/mL) chemo infusion, 15.9 mg/kg = 1,820 mg, Intravenous, Once, 1 of 1 cycle Administration: 1,800 mg (09/17/2017) daratumumab (DARZALEX) 1,800 mg in sodium chloride 0.9 % 410 mL (3.6 mg/mL) chemo infusion, 1,820 mg, Intravenous, Once, 13 of 13 cycles Administration: 1,800 mg (10/28/2017), 1,800 mg  (11/04/2017), 1,800 mg (11/11/2017), 1,800 mg (11/18/2017), 1,800 mg (11/25/2017), 1,800 mg (12/02/2017), 1,800 mg (12/09/2017), 1,800 mg (12/16/2017), 1,800 mg (12/30/2017), 1,800 mg (04/26/2018), 1,800 mg (01/13/2018), 1,800 mg (02/12/2018), 1,800 mg (02/26/2018), 1,800 mg (03/15/2018), 1,800 mg (03/29/2018), 1,800 mg (04/12/2018), 1,800 mg (05/10/2018), 1,800 mg (06/07/2018), 1,800 mg (07/05/2018), 1,800 mg (08/02/2018), 1,800 mg (08/30/2018), 1,800 mg (09/27/2018)  for chemotherapy treatment.         INTERVAL HISTORY:  Mr. HeMazur89.o. male presents today for follow-up.  He reports overall doing fair.  His main concern today is bilateral lower leg pain.  He states he feels as though his muscles in his bones just ache.  He states he has tried taking potassium without relief.  He has tried different positions without any relief.  He denies any known injury.  Patient states the pain keeps him from sleeping.  The pain is relieved with as needed oxycodone.  He also reports insomnia.  He states Ambien does not work for him.  He would like to try different medication.  He states he also does not like to take oxycodone because it constipates him.  We will address all of these concerns today.   REVIEW OF SYSTEMS:  Review of Systems  Constitutional: Positive for fatigue.  HENT:  Negative.   Eyes: Negative.   Respiratory: Positive for cough.   Cardiovascular: Negative.   Gastrointestinal: Negative.   Endocrine: Negative.   Genitourinary: Negative.    Musculoskeletal: Positive for arthralgias, back pain, gait problem and myalgias.  Skin: Negative.  Neurological: Positive for extremity weakness and gait problem.  Hematological: Negative.   Psychiatric/Behavioral: Negative.      PAST MEDICAL/SURGICAL HISTORY:  Past Medical History:  Diagnosis Date  . BPH (benign prostatic hyperplasia)   . Colonic diverticular abscess 01/08/2015  . Diverticulitis 00/93/81   complicated by abscess and required percutaneous  drainage  . GERD (gastroesophageal reflux disease)   . H/O ETOH abuse   . History of chemotherapy last done jan 2017  . History of radiation therapy 01/05/13-02/10/13   45 gray to left occipital condyle region  . History of radiation therapy 12/01/16-12/10/16   Parasternal nodule, chest- 24 Gy total delivered in 8 fractions, Left sacro-iliac, pelvis- 24 Gy total delivered in 8 fractions   . History of radiation therapy 02/19/17-02/22/17   right temporal scalp 30 Gy in 10 fractions  . Intra-abdominal abscess ([employer])   . Multiple myeloma ([place]) 2015  . Neuropathy   . Radiation 02/21/14-03/08/14   right posterior chest wall area 30 gray  . Radiation 04/19/14-05/02/14   lumbar spine 25 gray  . Skull lesion    Left occipital condyle  . Wrist fracture, left    x 2   Past Surgical History:  Procedure Laterality Date  . COLONOSCOPY N/A 04/19/2015   Procedure: COLONOSCOPY;  Surgeon: Danie Binder, MD;  Location: [facility];  Service: Endoscopy;  Laterality: N/A;  1:30 PM  . CYST EXCISION  1959   tail bone  . IR IMAGING GUIDED PORT INSERTION  09/25/2017     SOCIAL HISTORY:  Social History   Socioeconomic History  . Marital status: Married    Spouse name: Not on file  . Number of children: 3  . Years of education: Not on file  . Highest education level: Not on file  Occupational History    Employer: RETIRED  Social Needs  . Financial resource strain: Not on file  . Food insecurity    Worry: Not on file    Inability: Not on file  . Transportation needs    Medical: Not on file    Non-medical: Not on file  Tobacco Use  . Smoking status: Never Smoker  . Smokeless tobacco: Never Used  Substance and Sexual Activity  . Alcohol use: No    Comment: " Not much no more"  . Drug use: No  . Sexual activity: Not on file  Lifestyle  . Physical activity    Days per week: Not on file    Minutes per session: Not on file  . Stress: Not on file  Relationships  . Social Herbalist  on phone: Not on file    Gets together: Not on file    Attends religious service: Not on file    Active member of club or organization: Not on file    Attends meetings of clubs or organizations: Not on file    Relationship status: Not on file  . Intimate partner violence    Fear of current or ex partner: Not on file    Emotionally abused: Not on file    Physically abused: Not on file    Forced sexual activity: Not on file  Other Topics Concern  . Not on file  Social History Narrative  . Not on file    FAMILY HISTORY:  Family History  Problem Relation Age of Onset  . Diabetes Mother   . Stroke Mother   . Hypertension Father     CURRENT MEDICATIONS:  Outpatient Encounter Medications  as of 11/30/2018  Medication Sig Note  . acyclovir (ZOVIRAX) 400 MG tablet TAKE 1 TABLET BY MOUTH 2 TIMES DAILY   . ALPRAZolam (XANAX) 1 MG tablet TAKE 2 TABELETS BY MOUTH AT BEDTIME AS NEEDED FOR ANXIETY OR SLEEP   . aspirin EC 325 MG tablet Take 1 tablet (325 mg total) by mouth daily.   Marland Kitchen CALCIUM-VITAMIN D PO Take 1 tablet by mouth 2 (two) times daily.    Marland Kitchen dexamethasone (DECADRON) 4 MG tablet Take 2.5 tablets (10 mg total) by mouth once a week. Take 10 mg once weekly 09/18/2017: Started on 09/18/17  . gabapentin (NEURONTIN) 400 MG capsule TAKE 1 CAPSULE BY MOUTH 3 TIMES DAILY   . MAGNESIUM PO Take 1 tablet by mouth at bedtime.   Marland Kitchen omeprazole (PRILOSEC) 20 MG capsule TAKE 1 CAPSULE BY MOUTH ONCE DAILY   . tamsulosin (FLOMAX) 0.4 MG CAPS capsule TAKE ONE CAPSULE BY MOUTH EVERY NIGHT ATBEDTIME   . zolendronic acid (ZOMETA) 4 MG/5ML injection Inject 4 mg into the vein every 30 (thirty) days.    . furosemide (LASIX) 20 MG tablet TAKE 1 TABLET BY MOUTH DAILY AS NEEDED FOR EDEMA. (Patient not taking: Reported on 11/30/2018)   . lidocaine-prilocaine (EMLA) cream Apply small amount over port one (1) hour prior to appointment. (Patient not taking: Reported on 11/30/2018)   . Oxycodone HCl 10 MG TABS Take 1  tablet (10 mg total) by mouth every 8 (eight) hours as needed. for pain (Patient not taking: Reported on 11/30/2018)   . tiZANidine (ZANAFLEX) 4 MG tablet Take 1 tablet (4 mg total) by mouth every 6 (six) hours as needed (headache). (Patient not taking: Reported on 11/30/2018)   . [DISCONTINUED] amoxicillin-clavulanate (AUGMENTIN) 875-125 MG tablet Take 1 tablet by mouth 2 (two) times daily. (Patient not taking: Reported on 11/22/2018)   . [DISCONTINUED] ciprofloxacin (CIPRO) 500 MG tablet Take 1 tablet (500 mg total) by mouth 2 (two) times daily. (Patient not taking: Reported on 11/22/2018)    No facility-administered encounter medications on file as of 11/30/2018.     ALLERGIES:  Allergies  Allergen Reactions  . Codeine Other (See Comments)    Headache  . Morphine And Related Other (See Comments)    Makes him feel weird  . Revlimid [Lenalidomide] Other (See Comments)    "Causes me to become weak"     PHYSICAL EXAM:  ECOG Performance status: 2  Vitals:   11/30/18 1007  BP: 102/81  Pulse: 73  Resp: 16  Temp: (!) 97.3 F (36.3 C)  SpO2: 97%   Filed Weights   11/30/18 1007  Weight: 231 lb (104.8 kg)    Physical Exam Constitutional:      Appearance: Normal appearance. He is obese.  HENT:     Head: Normocephalic.     Right Ear: External ear normal.     Left Ear: External ear normal.     Nose: Nose normal.     Mouth/Throat:     Pharynx: Oropharynx is clear.  Eyes:     Conjunctiva/sclera: Conjunctivae normal.  Neck:     Musculoskeletal: Normal range of motion.  Cardiovascular:     Rate and Rhythm: Normal rate and regular rhythm.     Pulses: Normal pulses.     Heart sounds: Normal heart sounds.  Pulmonary:     Effort: Pulmonary effort is normal.     Comments: Diminished breath sounds all four lobes  Abdominal:     General: Bowel sounds are normal.  Musculoskeletal:     Comments: Decreased ROM BLE   Skin:    General: Skin is warm.  Neurological:     General:  No focal deficit present.     Mental Status: He is alert. Mental status is at baseline.  Psychiatric:        Mood and Affect: Mood normal.        Thought Content: Thought content normal.        Judgment: Judgment normal.      LABORATORY DATA:  I have reviewed the labs as listed.  CBC    Component Value Date/Time   WBC 6.0 11/30/2018 0909   RBC 5.01 11/30/2018 0909   HGB 15.1 11/30/2018 0909   HCT 46.9 11/30/2018 0909   PLT 123 (L) 11/30/2018 0909   MCV 93.6 11/30/2018 0909   MCH 30.1 11/30/2018 0909   MCHC 32.2 11/30/2018 0909   RDW 13.9 11/30/2018 0909   LYMPHSABS 1.7 11/30/2018 0909   MONOABS 0.6 11/30/2018 0909   EOSABS 0.0 11/30/2018 0909   BASOSABS 0.0 11/30/2018 0909   CMP Latest Ref Rng & Units 11/30/2018 11/22/2018 10/25/2018  Glucose 70 - 99 mg/dL 101(H) 102(H) 98  BUN 8 - 23 mg/dL _0 Creatinine 0.61 - 1.24 mg/dL 0.67 0.67 0.78  Sodium 135 - 145 mmol/L 140 142 141  Potassium 3.5 - 5.1 mmol/L 4.1 4.2 3.9  Chloride 98 - 111 mmol/L 108 108 105  CO2 22 - 32 mmol/L _1 Calcium 8.9 - 10.3 mg/dL 8.3(L) 9.0 9.0  Total Protein 6.5 - 8.1 g/dL 6.0(L) 5.6(L) 5.9(L)  Total Bilirubin 0.3 - 1.2 mg/dL 0.7 0.4 0.7  Alkaline Phos 38 - 126 U/L 23(L) 20(L) 25(L)  AST 15 - 41 U/L _2 ALT 0 - 44 U/L _3 DIAGNOSTIC IMAGING:  I have independently reviewed the scans and discussed with the patient.   I have reviewed Francene Finders, NP's note and agree with the documentation.  I personally performed a face-to-face visit, made revisions and my assessment and plan is as follows.    ASSESSMENT & PLAN:   Multiple myeloma without remission ([place]) 1.  Recurrent IgG kappa multiple myeloma: - RVD from 04/07/2014 through 07/28/2014 followed by maintenance Revlimid, which was discontinued. - MRI of the brain on 02/09/2017 showed new right temporal lesion, status post radiation therapy, PET CT in January 2019 showed improvement of multiple myeloma with no new  lesions, started on weekly Velcade and dexamethasone on 02/18/2017, status post 2 cycles, Velcade changed every other week on 05/06/2017 secondary to neuropathy. - MRI of the thoracic and lumbar spine on 09/03/2017 showed new rib lesion close to the spine on the left side, possibly eighth rib.  There was soft tissue mass in that area.  There is chronic L4 compression fracture which is stable. -Status post XRT to the left eighth rib lesion, 30 Gy in 10 fractions from 09/29/2017 through 10/14/2017. - DPD regimen started on 09/17/2017, developed erythematous maculopapular rash involving the trunk after taking 3 pills of pomalidomide. - Daratumumab restarted after radiation on 10/28/2017, pomalidomide completely discontinued on 02/12/2018 for recurrent rash. - He is continuing daratumumab every 4 weeks very well.  He is taking Decadron 10 mg weekly. -He was started on SubQ Dara on 10/25/18.   -He is tolerating subcu Dara well without any adverse reactions.   - Presents today with pain in both legs. He denies  Any known  injury.  Labs and electrolytes are stable today.  Pain is controlled with as needed oxycodone.  Have recommended patient proceed with skeletal survey to rule out any underlying etiology. -We will see him back in 1 week.  Have recommended patient take as needed oxycodone as needed.  2.  Peripheral neuropathy: -We will continue gabapentin 3 times daily.  3.  Bone protection: -He is receiving Zometa every 8 weeks.  4.  Low back pain: -He is taking oxycodone 10 mg 3 tablets daily.  He reports worsening of pain. -He reports the pain relief lasting only 3 hours. -He was told to take oxycodone every 4-6 hours as needed.  5.  Difficulty falling asleep: -States Ambien did not work.  Have recommended patient start trazodone 100 mg nightly.  6.  Constipation secondary to pain medication -Start patient on Senokot twice daily.      Orders placed this encounter:  Orders Placed This Encounter   Procedures  . [place] [phone]

## 2018-11-30 NOTE — Assessment & Plan Note (Addendum)
1.  Recurrent IgG kappa multiple myeloma: - RVD from 04/07/2014 through 07/28/2014 followed by maintenance Revlimid, which was discontinued. - MRI of the brain on 02/09/2017 showed new right temporal lesion, status post radiation therapy, PET CT in January 2019 showed improvement of multiple myeloma with no new lesions, started on weekly Velcade and dexamethasone on 02/18/2017, status post 2 cycles, Velcade changed every other week on 05/06/2017 secondary to neuropathy. - MRI of the thoracic and lumbar spine on 09/03/2017 showed new rib lesion close to the spine on the left side, possibly eighth rib.  There was soft tissue mass in that area.  There is chronic L4 compression fracture which is stable. -Status post XRT to the left eighth rib lesion, 30 Gy in 10 fractions from 09/29/2017 through 10/14/2017. - DPD regimen started on 09/17/2017, developed erythematous maculopapular rash involving the trunk after taking 3 pills of pomalidomide. - Daratumumab restarted after radiation on 10/28/2017, pomalidomide completely discontinued on 02/12/2018 for recurrent rash. - He is continuing daratumumab every 4 weeks very well.  He is taking Decadron 10 mg weekly. -He was started on SubQ Dara on 10/25/18.   -He is tolerating subcu Dara well without any adverse reactions.   - Presents today with pain in both legs. He denies  Any known injury.  Labs and electrolytes are stable today.  Pain is controlled with as needed oxycodone.  Have recommended patient proceed with skeletal survey to rule out any underlying etiology. -We will see him back in 1 week.  Have recommended patient take as needed oxycodone as needed.  2.  Peripheral neuropathy: -We will continue gabapentin 3 times daily.  3.  Bone protection: -He is receiving Zometa every 8 weeks.  4.  Low back pain: -He is taking oxycodone 10 mg 3 tablets daily.  He reports worsening of pain. -He reports the pain relief lasting only 3 hours. -He was told to take oxycodone  every 4-6 hours as needed.  5.  Difficulty falling asleep: -States Ambien did not work.  Have recommended patient start trazodone 100 mg nightly.  6.  Constipation secondary to pain medication -Start patient on Senokot twice daily.

## 2018-12-09 ENCOUNTER — Ambulatory Visit (HOSPITAL_COMMUNITY): Payer: Medicare Other | Admitting: Hematology

## 2018-12-10 ENCOUNTER — Encounter (HOSPITAL_COMMUNITY): Payer: Self-pay | Admitting: Hematology

## 2018-12-10 ENCOUNTER — Other Ambulatory Visit: Payer: Self-pay

## 2018-12-10 ENCOUNTER — Inpatient Hospital Stay (HOSPITAL_COMMUNITY): Payer: Medicare Other | Attending: Hematology | Admitting: Hematology

## 2018-12-10 VITALS — BP 139/74 | HR 86 | Temp 97.8°F | Resp 18 | Wt 232.4 lb

## 2018-12-10 DIAGNOSIS — G620 Drug-induced polyneuropathy: Secondary | ICD-10-CM | POA: Diagnosis not present

## 2018-12-10 DIAGNOSIS — M545 Low back pain: Secondary | ICD-10-CM | POA: Insufficient documentation

## 2018-12-10 DIAGNOSIS — G4700 Insomnia, unspecified: Secondary | ICD-10-CM | POA: Insufficient documentation

## 2018-12-10 DIAGNOSIS — T451X5A Adverse effect of antineoplastic and immunosuppressive drugs, initial encounter: Secondary | ICD-10-CM | POA: Insufficient documentation

## 2018-12-10 DIAGNOSIS — K5903 Drug induced constipation: Secondary | ICD-10-CM | POA: Diagnosis not present

## 2018-12-10 DIAGNOSIS — C9000 Multiple myeloma not having achieved remission: Secondary | ICD-10-CM | POA: Insufficient documentation

## 2018-12-10 DIAGNOSIS — Z5112 Encounter for antineoplastic immunotherapy: Secondary | ICD-10-CM | POA: Insufficient documentation

## 2018-12-10 NOTE — Assessment & Plan Note (Signed)
1.  Recurrent IgG kappa multiple myeloma: - RVD from 04/07/2014 through 07/28/2014 followed by maintenance Revlimid, which was discontinued. - MRI of the brain on 02/09/2017 showed new right temporal lesion, status post radiation therapy, PET CT in January 2019 showed improvement of multiple myeloma with no new lesions, started on weekly Velcade and dexamethasone on 02/18/2017, status post 2 cycles, Velcade changed every other week on 05/06/2017 secondary to neuropathy. - MRI of the thoracic and lumbar spine on 09/03/2017 showed new rib lesion close to the spine on the left side, possibly eighth rib.  There was soft tissue mass in that area.  There is chronic L4 compression fracture which is stable. -Status post XRT to the left eighth rib lesion, 30 Gy in 10 fractions from 09/29/2017 through 10/14/2017. - DPD regimen started on 09/17/2017, developed erythematous maculopapular rash involving the trunk after taking 3 pills of pomalidomide. - Daratumumab restarted after radiation on 10/28/2017, pomalidomide completely discontinued on 02/12/2018 for recurrent rash. - He is continuing daratumumab every 4 weeks very well.  He is taking Decadron 10 mg weekly. -He was started on SubQ Dara on 10/25/18.   -He is tolerating subcu Dara well without any adverse reactions.   - Presented  with pain in both legs. He denies  Any known injury.  Labs and electrolytes are stable today.  Pain is controlled with as needed oxycodone.  Have recommended patient proceed with skeletal survey to rule out any underlying etiology. -Nodule survey completed on November 30, 2018 revealed concern for disease progression with new lytic lesion found in the right first and left eighth rib.  Patient denies any known injury to ribs. -Discussed findings with patient.  Would like to compare multiple myeloma labs with these findings.  Most all labs were not completed today.  If multiple myeloma labs are consistent with disease progression would recommend bone  marrow biopsy to determine amount of plasma cells in the marrow. -Patient will return in 1 week for repeat labs.  2.  Peripheral neuropathy: -We will continue gabapentin 3 times daily.  3.  Bone protection: -He is receiving Zometa every 8 weeks.  4.  Low back pain: -He is taking oxycodone 10 mg 3 tablets daily.  He reports worsening of pain. -He reports the pain relief lasting only 3 hours. -He was told to take oxycodone every 4-6 hours as needed.  5.  Difficulty falling asleep: -States Ambien did not work.  Have recommended patient start trazodone 100 mg nightly.  6.  Constipation secondary to pain medication -Start patient on Senokot twice daily.

## 2018-12-10 NOTE — Progress Notes (Signed)
Dean [name], [place] [address]   CLINIC:  Medical Oncology/Hematology  PCP:  [name], MD [address] [place] [address] [phone]   REASON FOR VISIT:  Follow-up for IgG kappa multiple myeloma  CURRENT THERAPY: Daratumumab  BRIEF ONCOLOGIC HISTORY:  Oncology History  Multiple myeloma without remission ([facility])  11/16/2012 Initial Diagnosis   L occipital condyle destructive lesion, not biopsied secondary to location. XRT   12/01/2012 Imaging   L3 superior endplate compression FX, no lytic lesions to suggest myeloma   02/06/2014 Imaging   soft tissue mass along the right posterior fifth rib with some rib destruction   02/16/2014 Miscellaneous   Normal CBC, Normal CMP, kappa,lamda ratio of 2.62 (abnormal), UIEP with slightly restricted band, IgG kappa, SPEP/IEP with monoclonal protein at 0.79 g/dl, normal igG, suppressed IGM   02/20/2014 Bone Marrow Biopsy   33% kappa restricted plasma cells Normal FISH, normal cytogenetics   02/21/2014 - 03/08/2014 Radiation Therapy   30Gy to R rib lesion, lesion not biopsied   03/16/2014 PET scan   Scattered hypermetabolic osseous lesions, including the calvarium, ribs, left scapula, sacrum, and left femoral shaft. An expansile left vertebral body lesion at L3 extends into the epidural space of the left lateral recess,    03/17/2014 - 04/07/2014 Chemotherapy   Velcade and Dexamethasone due to initial denial of Revlimid by insurance company.  Zometa monthly.   03/22/2014 Imaging   MR_ L-Spine- Enhancing lesions compatible with multiple myeloma at L2, L3, and S1-2.   04/07/2014 - 07/28/2014 Chemotherapy   RVD with Revlimid at 25 mg days 1-14.  Revlimid dose reduced to 25 mg every other day x 14 days with 7 day respite beginning on day 1 of cycle 2.   04/17/2014 Adverse Reaction   Revlimid-induced rash.  Treated with steroids.   07/28/2014 - 08/04/2014 Chemotherapy   Revlimid daily    08/04/2014 Adverse Reaction   Velcade-induced peripheral neuropathy   08/04/2014 Treatment Plan Change   D/C Velcade   08/11/2014 PET scan   Response to therapy. Improvement and resolution of foci of osseous hypermetabolism.   08/22/2014 - 08/28/2014 Chemotherapy   Revlimid 25 mg days 1-21 every 28 days   08/28/2014 Adverse Reaction   Revlimid-induced rash. Medication held.  Medrol dose Pak prescribed.   09/04/2014 - 01/08/2015 Chemotherapy   Revlimid 10 mg every other day, without dexamethasone   01/08/2015 - 01/15/2015 Hospital Admission   Colonic diverticular abscess with IR drain placement   03/02/2015 PET scan   Only bony uptake is low activity at site of deformity/callus at R second rib FX, high activity in sigmoid colon, advanced sigmoid divertidulosis. Abscess not resolved?   06/04/2015 Imaging   CT nonobstructive L renal calculus, diverticulosis of descending and sigmoid colon without inflammation, moderate prostatic enlargement   07/12/2015 Surgery   Diverticulitis s/p robotic sigmoid colectomy with Dr. Johney Maine   08/23/2015 PET scan   Primarily similar hypermetabolic osseous foci within the R sided ribs. new L third rib focus of hypermetab and sclerosis is favored to be related to healing FX. no soft tissue myeloma id'd. Presumed postop hypermetab and edema about the R pelvic wall    03/06/2016 PET scan   1. Reduced activity in the prior lesion such as the right second and fifth rib lesions, activity nearly completely resolved and significantly less than added mediastinal blood pool activity. No new lesions are identified. 2. Other imaging findings of potential clinical  significance: Coronary, aortic arch, and branch vessel atherosclerotic vascular disease. Aortoiliac atherosclerotic vascular disease. Enlarged prostate gland. Colonic diverticula.   02/17/2017 PET scan   HEAD/NECK: No hypermetabolic activity in the scalp. No hypermetabolic cervical lymph nodes. CHEST: No hypermetabolic  mediastinal or hilar nodes. Right upper lobe scarring/atelectasis. No suspicious pulmonary nodules on the CT scan. ABDOMEN/PELVIS: No abnormal hypermetabolic activity within the liver, pancreas, adrenal glands, or spleen. No hypermetabolic lymph nodes in the abdomen or pelvis. Atherosclerotic calcifications of the abdominal aorta and branch vessels. Left renal sinus cysts. Sigmoid diverticulosis, without evidence of diverticulitis. Prostatomegaly. SKELETON: Vague hypermetabolism involving the inferior sternum, max SUV 3.2, previously 8.2. Focal hypermetabolism involving the left sacrum, max SUV 4.0, previously 6.7. EXTREMITIES: No abnormal hypermetabolic activity in the lower extremities.   02/18/2017 -  Chemotherapy   Bortezomib 1.93m/m2 QWk + Dexamethasone 134mQTue/Fri --Cycle #1, 02/18/17    02/18/2017 - 08/26/2017 Chemotherapy   The patient had bortezomib SQ (VELCADE) chemo injection 3 mg, 1.3 mg/m2 = 3 mg, Subcutaneous,  Once, 7 of 9 cycles Administration: 3 mg (02/18/2017), 3 mg (02/26/2017), 3 mg (03/04/2017), 3 mg (03/11/2017), 3 mg (04/08/2017), 3 mg (03/18/2017), 3 mg (03/25/2017), 3 mg (04/01/2017), 3 mg (05/06/2017), 3 mg (05/20/2017), 3 mg (06/03/2017), 3 mg (06/17/2017), 3 mg (07/01/2017), 3 mg (07/15/2017), 3 mg (07/29/2017), 3 mg (08/12/2017), 3 mg (08/26/2017)  for chemotherapy treatment.    09/17/2017 -  Chemotherapy   The patient had daratumumab-hyaluronidase-fihj (DARZALEX FASPRO) 1800-30000 MG-UT/15ML chemo SQ injection 1,800 mg, 1,800 mg, Subcutaneous,  Once, 2 of 3 cycles Administration: 1,800 mg (10/25/2018), 1,800 mg (11/22/2018) daratumumab (DARZALEX) 1,800 mg in sodium chloride 0.9 % 910 mL (1.8 mg/mL) chemo infusion, 15.9 mg/kg = 1,820 mg, Intravenous, Once, 1 of 1 cycle Administration: 1,800 mg (09/17/2017) daratumumab (DARZALEX) 1,800 mg in sodium chloride 0.9 % 410 mL (3.6 mg/mL) chemo infusion, 1,820 mg, Intravenous, Once, 13 of 13 cycles Administration: 1,800 mg (10/28/2017), 1,800 mg  (11/04/2017), 1,800 mg (11/11/2017), 1,800 mg (11/18/2017), 1,800 mg (11/25/2017), 1,800 mg (12/02/2017), 1,800 mg (12/09/2017), 1,800 mg (12/16/2017), 1,800 mg (12/30/2017), 1,800 mg (04/26/2018), 1,800 mg (01/13/2018), 1,800 mg (02/12/2018), 1,800 mg (02/26/2018), 1,800 mg (03/15/2018), 1,800 mg (03/29/2018), 1,800 mg (04/12/2018), 1,800 mg (05/10/2018), 1,800 mg (06/07/2018), 1,800 mg (07/05/2018), 1,800 mg (08/02/2018), 1,800 mg (08/30/2018), 1,800 mg (09/27/2018)  for chemotherapy treatment.         INTERVAL HISTORY:  Mr. HeSelsor89.o. male presents today for follow-up.  Reports overall doing fair.  Reports mild to moderate fatigue.  Continues with reports of right and left leg pain.  Right leg pain is predominantly worse.  It starts in his hip and radiates down.  He states he is able to put on topical anesthetics with improvement of symptoms.  He is also taking as needed oxycodone.  Patient has a lot of domestic concerns today.  He states his wife has Alzheimer's and he needs help taking care of her.  He also has a son who is an alcoholic and causes a lot of stress for him.  Patient is here to discuss most recent results of skeletal survey.  He was also started on trazodone for insomnia.  He states he is now able to sleep 7 to 8 hours a night.    REVIEW OF SYSTEMS:  Review of Systems  Constitutional: Positive for fatigue.  HENT:  Negative.   Eyes: Negative.   Respiratory: Negative.   Cardiovascular: Negative.   Gastrointestinal: Negative.   Endocrine: Negative.   Genitourinary:  Negative.    Musculoskeletal: Positive for arthralgias, back pain, gait problem and myalgias.  Skin: Negative.   Neurological: Positive for extremity weakness and gait problem.  Hematological: Negative.   Psychiatric/Behavioral: Negative.      PAST MEDICAL/SURGICAL HISTORY:  Past Medical History:  Diagnosis Date  . BPH (benign prostatic hyperplasia)   . Colonic diverticular abscess 01/08/2015  . Diverticulitis 08/67/61    complicated by abscess and required percutaneous drainage  . GERD (gastroesophageal reflux disease)   . H/O ETOH abuse   . History of chemotherapy last done jan 2017  . History of radiation therapy 01/05/13-02/10/13   45 gray to left occipital condyle region  . History of radiation therapy 12/01/16-12/10/16   Parasternal nodule, chest- 24 Gy total delivered in 8 fractions, Left sacro-iliac, pelvis- 24 Gy total delivered in 8 fractions   . History of radiation therapy 02/19/17-02/22/17   right temporal scalp 30 Gy in 10 fractions  . Intra-abdominal abscess ([facility])   . Multiple myeloma ([facility]) 2015  . Neuropathy   . Radiation 02/21/14-03/08/14   right posterior chest wall area 30 gray  . Radiation 04/19/14-05/02/14   lumbar spine 25 gray  . Skull lesion    Left occipital condyle  . Wrist fracture, left    x 2   Past Surgical History:  Procedure Laterality Date  . COLONOSCOPY N/A 04/19/2015   Procedure: COLONOSCOPY;  Surgeon: Danie Binder, MD;  Location: [facility];  Service: Endoscopy;  Laterality: N/A;  1:30 PM  . CYST EXCISION  1959   tail bone  . IR IMAGING GUIDED PORT INSERTION  09/25/2017     SOCIAL HISTORY:  Social History   Socioeconomic History  . Marital status: Married    Spouse name: Not on file  . Number of children: 3  . Years of education: Not on file  . Highest education level: Not on file  Occupational History    Employer: RETIRED  Social Needs  . Financial resource strain: Not on file  . Food insecurity    Worry: Not on file    Inability: Not on file  . Transportation needs    Medical: Not on file    Non-medical: Not on file  Tobacco Use  . Smoking status: Never Smoker  . Smokeless tobacco: Never Used  Substance and Sexual Activity  . Alcohol use: No    Comment: " Not much no more"  . Drug use: No  . Sexual activity: Not on file  Lifestyle  . Physical activity    Days per week: Not on file    Minutes per session: Not on file  . Stress: Not on  file  Relationships  . Social Herbalist on phone: Not on file    Gets together: Not on file    Attends religious service: Not on file    Active member of club or organization: Not on file    Attends meetings of clubs or organizations: Not on file    Relationship status: Not on file  . Intimate partner violence    Fear of current or ex partner: Not on file    Emotionally abused: Not on file    Physically abused: Not on file    Forced sexual activity: Not on file  Other Topics Concern  . Not on file  Social History Narrative  . Not on file    FAMILY HISTORY:  Family History  Problem Relation Age of Onset  . Diabetes Mother   .  Stroke Mother   . Hypertension Father     CURRENT MEDICATIONS:  Outpatient Encounter Medications as of 12/10/2018  Medication Sig Note  . acyclovir (ZOVIRAX) 400 MG tablet TAKE 1 TABLET BY MOUTH 2 TIMES DAILY   . CALCIUM-VITAMIN D PO Take 1 tablet by mouth 2 (two) times daily.    Marland Kitchen dexamethasone (DECADRON) 4 MG tablet Take 2.5 tablets (10 mg total) by mouth once a week. Take 10 mg once weekly 09/18/2017: Started on 09/18/17  . gabapentin (NEURONTIN) 400 MG capsule TAKE 1 CAPSULE BY MOUTH 3 TIMES DAILY   . MAGNESIUM PO Take 1 tablet by mouth at bedtime.   Marland Kitchen omeprazole (PRILOSEC) 20 MG capsule TAKE 1 CAPSULE BY MOUTH ONCE DAILY   . senna-docusate (SENOKOT-S) 8.6-50 MG tablet Take 1 tablet by mouth 2 (two) times daily.   . tamsulosin (FLOMAX) 0.4 MG CAPS capsule TAKE ONE CAPSULE BY MOUTH EVERY NIGHT ATBEDTIME   . traZODone (DESYREL) 100 MG tablet Take 1 tablet (100 mg total) by mouth at bedtime. May titrate up to 300 mg nightly.   . zolendronic acid (ZOMETA) 4 MG/5ML injection Inject 4 mg into the vein every 30 (thirty) days.    . [DISCONTINUED] aspirin EC 325 MG tablet Take 1 tablet (325 mg total) by mouth daily.   Marland Kitchen ALPRAZolam (XANAX) 1 MG tablet TAKE 2 TABELETS BY MOUTH AT BEDTIME AS NEEDED FOR ANXIETY OR SLEEP (Patient not taking: Reported on  12/10/2018)   . furosemide (LASIX) 20 MG tablet TAKE 1 TABLET BY MOUTH DAILY AS NEEDED FOR EDEMA. (Patient not taking: Reported on 11/30/2018)   . lidocaine-prilocaine (EMLA) cream Apply small amount over port one (1) hour prior to appointment. (Patient not taking: Reported on 11/30/2018)   . Oxycodone HCl 10 MG TABS Take 1 tablet (10 mg total) by mouth every 8 (eight) hours as needed. for pain (Patient not taking: Reported on 11/30/2018)   . tiZANidine (ZANAFLEX) 4 MG tablet Take 1 tablet (4 mg total) by mouth every 6 (six) hours as needed (headache). (Patient not taking: Reported on 11/30/2018)   . [DISCONTINUED] zolpidem (AMBIEN) 10 MG tablet Take 10 mg by mouth at bedtime as needed.    No facility-administered encounter medications on file as of 12/10/2018.     ALLERGIES:  Allergies  Allergen Reactions  . Codeine Other (See Comments)    Headache  . Morphine And Related Other (See Comments)    Makes him feel weird  . Revlimid [Lenalidomide] Other (See Comments)    "Causes me to become weak"     PHYSICAL EXAM:  ECOG Performance status: 2  Vitals:   12/10/18 1026  BP: 139/74  Pulse: 86  Resp: 18  Temp: 97.8 F (36.6 C)  SpO2: 98%   Filed Weights   12/10/18 1026  Weight: 232 lb 6.4 oz (105.4 kg)    Physical Exam Constitutional:      Appearance: Normal appearance.  HENT:     Head: Normocephalic.     Right Ear: External ear normal.     Left Ear: External ear normal.     Nose: Nose normal.     Mouth/Throat:     Pharynx: Oropharynx is clear.  Eyes:     Conjunctiva/sclera: Conjunctivae normal.  Neck:     Musculoskeletal: Normal range of motion.  Cardiovascular:     Rate and Rhythm: Normal rate and regular rhythm.     Pulses: Normal pulses.     Heart sounds: Normal heart sounds.  Pulmonary:  Effort: Pulmonary effort is normal.     Breath sounds: Normal breath sounds.  Abdominal:     General: Bowel sounds are normal.  Musculoskeletal:     Comments: Decreased  range of motion  Skin:    General: Skin is warm.  Neurological:     General: No focal deficit present.     Mental Status: He is alert and oriented to person, place, and time.  Psychiatric:        Mood and Affect: Mood normal.        Behavior: Behavior normal.        Thought Content: Thought content normal.        Judgment: Judgment normal.      LABORATORY DATA:  I have reviewed the labs as listed.  CBC    Component Value Date/Time   WBC 6.0 11/30/2018 0909   RBC 5.01 11/30/2018 0909   HGB 15.1 11/30/2018 0909   HCT 46.9 11/30/2018 0909   PLT 123 (L) 11/30/2018 0909   MCV 93.6 11/30/2018 0909   MCH 30.1 11/30/2018 0909   MCHC 32.2 11/30/2018 0909   RDW 13.9 11/30/2018 0909   LYMPHSABS 1.7 11/30/2018 0909   MONOABS 0.6 11/30/2018 0909   EOSABS 0.0 11/30/2018 0909   BASOSABS 0.0 11/30/2018 0909   CMP Latest Ref Rng & Units 11/30/2018 11/22/2018 10/25/2018  Glucose 70 - 99 mg/dL 101(H) 102(H) 98  BUN 8 - 23 mg/dL 21 16 13   Creatinine 0.61 - 1.24 mg/dL 0.67 0.67 0.78  Sodium 135 - 145 mmol/L 140 142 141  Potassium 3.5 - 5.1 mmol/L 4.1 4.2 3.9  Chloride 98 - 111 mmol/L 108 108 105  CO2 22 - 32 mmol/L 26 27 27   Calcium 8.9 - 10.3 mg/dL 8.3(L) 9.0 9.0  Total Protein 6.5 - 8.1 g/dL 6.0(L) 5.6(L) 5.9(L)  Total Bilirubin 0.3 - 1.2 mg/dL 0.7 0.4 0.7  Alkaline Phos 38 - 126 U/L 23(L) 20(L) 25(L)  AST 15 - 41 U/L 15 15 16   ALT 0 - 44 U/L 15 15 14         ASSESSMENT & PLAN:   Multiple myeloma without remission ([facility]) 1.  Recurrent IgG kappa multiple myeloma: - RVD from 04/07/2014 through 07/28/2014 followed by maintenance Revlimid, which was discontinued. - MRI of the brain on 02/09/2017 showed new right temporal lesion, status post radiation therapy, PET CT in January 2019 showed improvement of multiple myeloma with no new lesions, started on weekly Velcade and dexamethasone on 02/18/2017, status post 2 cycles, Velcade changed every other week on 05/06/2017 secondary to neuropathy.  - MRI of the thoracic and lumbar spine on 09/03/2017 showed new rib lesion close to the spine on the left side, possibly eighth rib.  There was soft tissue mass in that area.  There is chronic L4 compression fracture which is stable. -Status post XRT to the left eighth rib lesion, 30 Gy in 10 fractions from 09/29/2017 through 10/14/2017. - DPD regimen started on 09/17/2017, developed erythematous maculopapular rash involving the trunk after taking 3 pills of pomalidomide. - Daratumumab restarted after radiation on 10/28/2017, pomalidomide completely discontinued on 02/12/2018 for recurrent rash. - He is continuing daratumumab every 4 weeks very well.  He is taking Decadron 10 mg weekly. -He was started on SubQ Dara on 10/25/18.   -He is tolerating subcu Dara well without any adverse reactions.   - Presented  with pain in both legs. He denies  Any known injury.  Labs and electrolytes are stable today.  Pain is controlled with as needed oxycodone.  Have recommended patient proceed with skeletal survey to rule out any underlying etiology. -Nodule survey completed on November 30, 2018 revealed concern for disease progression with new lytic lesion found in the right first and left eighth rib.  Patient denies any known injury to ribs. -Discussed findings with patient.  Would like to compare multiple myeloma labs with these findings.  Most all labs were not completed today.  If multiple myeloma labs are consistent with disease progression would recommend bone marrow biopsy to determine amount of plasma cells in the marrow. -Patient will return in 1 week for repeat labs.  2.  Peripheral neuropathy: -We will continue gabapentin 3 times daily.  3.  Bone protection: -He is receiving Zometa every 8 weeks.  4.  Low back pain: -He is taking oxycodone 10 mg 3 tablets daily.  He reports worsening of pain. -He reports the pain relief lasting only 3 hours. -He was told to take oxycodone every 4-6 hours as needed.  5.   Difficulty falling asleep: -States Ambien did not work.  Have recommended patient start trazodone 100 mg nightly.  6.  Constipation secondary to pain medication -Start patient on Senokot twice daily.      Orders placed this encounter:  Orders Placed This Encounter  Procedures  . Multiple Myeloma Panel (SPEP&IFE w/QIG)  . Kappa/lambda light chains  . CBC with Differential  . Comprehensive metabolic panel      [place] [phone]

## 2018-12-13 ENCOUNTER — Telehealth (HOSPITAL_COMMUNITY): Payer: Self-pay | Admitting: *Deleted

## 2018-12-13 NOTE — Telephone Encounter (Signed)
Pt called into clinic because he was unsure of the name of the medication sent to the pharmacy.Tried to call pt about the medication but  no answer and I couldn't leave a VM. Will try to call back.

## 2018-12-14 ENCOUNTER — Encounter (HOSPITAL_COMMUNITY): Payer: Self-pay | Admitting: General Practice

## 2018-12-14 NOTE — Progress Notes (Signed)
[facility] CSW Progress Notes  Called patient at request of scheduler - patient cares for wife who has Alzheimers disease in the home.  Patient states that he is doing "as well as can be expected, feeling pretty good really."  Appreciative of care given to him at [facility] and [facility].  Wife lives in the home, is able to clean and help with yard work.  Major issue is "she picks things up and puts them somewhere else."  May also be somewhat irritable with patient.  Unclear whether family has any support for dealing with dementia care in the home.  Wife does not have Medicaid, only Medicare, thus in home aides would not be covered by their medical policy.  Will continue to check in on patient over time to offer help and problem solve as possible.  Edwyna Shell, LCSW Clinical Social Worker Phone:  [phone] Cell:  [phone]

## 2018-12-20 ENCOUNTER — Other Ambulatory Visit: Payer: Self-pay

## 2018-12-20 ENCOUNTER — Inpatient Hospital Stay (HOSPITAL_COMMUNITY): Payer: Medicare Other

## 2018-12-20 ENCOUNTER — Inpatient Hospital Stay (HOSPITAL_BASED_OUTPATIENT_CLINIC_OR_DEPARTMENT_OTHER): Payer: Medicare Other | Admitting: Hematology

## 2018-12-20 ENCOUNTER — Encounter (HOSPITAL_COMMUNITY): Payer: Self-pay | Admitting: Hematology

## 2018-12-20 VITALS — BP 129/71 | HR 86 | Temp 97.1°F | Resp 20 | Wt 227.2 lb

## 2018-12-20 DIAGNOSIS — G620 Drug-induced polyneuropathy: Secondary | ICD-10-CM | POA: Diagnosis not present

## 2018-12-20 DIAGNOSIS — C9000 Multiple myeloma not having achieved remission: Secondary | ICD-10-CM

## 2018-12-20 DIAGNOSIS — G4700 Insomnia, unspecified: Secondary | ICD-10-CM | POA: Diagnosis not present

## 2018-12-20 DIAGNOSIS — M545 Low back pain: Secondary | ICD-10-CM | POA: Diagnosis not present

## 2018-12-20 DIAGNOSIS — K5903 Drug induced constipation: Secondary | ICD-10-CM | POA: Diagnosis not present

## 2018-12-20 DIAGNOSIS — Z5112 Encounter for antineoplastic immunotherapy: Secondary | ICD-10-CM | POA: Diagnosis not present

## 2018-12-20 LAB — COMPREHENSIVE METABOLIC PANEL
ALT: 13 U/L (ref 0–44)
AST: 16 U/L (ref 15–41)
Albumin: 3.7 g/dL (ref 3.5–5.0)
Alkaline Phosphatase: 19 U/L — ABNORMAL LOW (ref 38–126)
Anion gap: 10 (ref 5–15)
BUN: 22 mg/dL (ref 8–23)
CO2: 25 mmol/L (ref 22–32)
Calcium: 8.9 mg/dL (ref 8.9–10.3)
Chloride: 105 mmol/L (ref 98–111)
Creatinine, Ser: 0.77 mg/dL (ref 0.61–1.24)
GFR calc Af Amer: >60 mL/min (ref >60)
GFR calc non Af Amer: >60 mL/min (ref >60)
Glucose, Bld: 121 mg/dL — ABNORMAL HIGH (ref 70–99)
Potassium: 3.8 mmol/L (ref 3.5–5.1)
Sodium: 140 mmol/L (ref 135–145)
Total Bilirubin: 0.9 mg/dL (ref 0.3–1.2)
Total Protein: 5.7 g/dL — ABNORMAL LOW (ref 6.5–8.1)

## 2018-12-20 LAB — CBC WITH DIFFERENTIAL/PLATELET
Abs Immature Granulocytes: 0.01 10*3/uL (ref 0.00–0.07)
Basophils Absolute: 0 10*3/uL (ref 0.0–0.1)
Basophils Relative: 0 %
Eosinophils Absolute: 0 10*3/uL (ref 0.0–0.5)
Eosinophils Relative: 0 %
HCT: 45 % (ref 39.0–52.0)
Hemoglobin: 14.8 g/dL (ref 13.0–17.0)
Immature Granulocytes: 0 %
Lymphocytes Relative: 22 %
Lymphs Abs: 1.1 10*3/uL (ref 0.7–4.0)
MCH: 30.3 pg (ref 26.0–34.0)
MCHC: 32.9 g/dL (ref 30.0–36.0)
MCV: 92.2 fL (ref 80.0–100.0)
Monocytes Absolute: 0.5 10*3/uL (ref 0.1–1.0)
Monocytes Relative: 10 %
Neutro Abs: 3.5 10*3/uL (ref 1.7–7.7)
Neutrophils Relative %: 68 %
Platelets: 124 10*3/uL — ABNORMAL LOW (ref 150–400)
RBC: 4.88 MIL/uL (ref 4.22–5.81)
RDW: 13.4 % (ref 11.5–15.5)
WBC: 5.1 10*3/uL (ref 4.0–10.5)
nRBC: 0 % (ref 0.0–0.2)

## 2018-12-20 MED ORDER — DIPHENHYDRAMINE HCL 25 MG PO CAPS
50.0000 mg | ORAL_CAPSULE | Freq: Once | ORAL | Status: AC
  Administered 2018-12-20: 50 mg via ORAL

## 2018-12-20 MED ORDER — DEXAMETHASONE 4 MG PO TABS
ORAL_TABLET | ORAL | Status: AC
  Filled 2018-12-20: qty 1

## 2018-12-20 MED ORDER — SODIUM CHLORIDE 0.9% FLUSH
10.0000 mL | INTRAVENOUS | Status: DC | PRN
  Administered 2018-12-20: 10 mL
  Filled 2018-12-20: qty 10

## 2018-12-20 MED ORDER — DIPHENHYDRAMINE HCL 25 MG PO CAPS
ORAL_CAPSULE | ORAL | Status: AC
  Filled 2018-12-20: qty 2

## 2018-12-20 MED ORDER — PROCHLORPERAZINE MALEATE 10 MG PO TABS
ORAL_TABLET | ORAL | Status: AC
  Filled 2018-12-20: qty 1

## 2018-12-20 MED ORDER — ACETAMINOPHEN 325 MG PO TABS
650.0000 mg | ORAL_TABLET | Freq: Once | ORAL | Status: AC
  Administered 2018-12-20: 650 mg via ORAL

## 2018-12-20 MED ORDER — DEXAMETHASONE 4 MG PO TABS
20.0000 mg | ORAL_TABLET | Freq: Once | ORAL | Status: AC
  Administered 2018-12-20: 20 mg via ORAL
  Filled 2018-12-20: qty 5

## 2018-12-20 MED ORDER — DARATUMUMAB-HYALURONIDASE-FIHJ 1800-30000 MG-UT/15ML ~~LOC~~ SOLN
1800.0000 mg | Freq: Once | SUBCUTANEOUS | Status: AC
  Administered 2018-12-20: 1800 mg via SUBCUTANEOUS
  Filled 2018-12-20: qty 15

## 2018-12-20 MED ORDER — HEPARIN SOD (PORK) LOCK FLUSH 100 UNIT/ML IV SOLN
500.0000 [IU] | Freq: Once | INTRAVENOUS | Status: AC | PRN
  Administered 2018-12-20: 500 [IU]

## 2018-12-20 MED ORDER — PROCHLORPERAZINE MALEATE 10 MG PO TABS
10.0000 mg | ORAL_TABLET | Freq: Once | ORAL | Status: AC
  Administered 2018-12-20: 10 mg via ORAL

## 2018-12-20 MED ORDER — ACETAMINOPHEN 325 MG PO TABS
ORAL_TABLET | ORAL | Status: AC
  Filled 2018-12-20: qty 2

## 2018-12-20 MED ORDER — SODIUM CHLORIDE 0.9 % IV SOLN: Freq: Once | INTRAVENOUS | Status: DC

## 2018-12-20 NOTE — Patient Instructions (Signed)
[facility] Discharge Instructions for Patients Receiving Chemotherapy  Today you received the following chemotherapy agents   To help prevent nausea and vomiting after your treatment, we encourage you to take your nausea medication   If you develop nausea and vomiting that is not controlled by your nausea medication, call the clinic.   BELOW ARE SYMPTOMS THAT SHOULD BE REPORTED IMMEDIATELY:  *FEVER GREATER THAN 100.5 F  *CHILLS WITH OR WITHOUT FEVER  NAUSEA AND VOMITING THAT IS NOT CONTROLLED WITH YOUR NAUSEA MEDICATION  *UNUSUAL SHORTNESS OF BREATH  *UNUSUAL BRUISING OR BLEEDING  TENDERNESS IN MOUTH AND THROAT WITH OR WITHOUT PRESENCE OF ULCERS  *URINARY PROBLEMS  *BOWEL PROBLEMS  UNUSUAL RASH Items with * indicate a potential emergency and should be followed up as soon as possible.  Feel free to call the clinic should you have any questions or concerns. The clinic phone number is [phone].  Please show the CHEMO ALERT CARD at check-in to the Emergency Department and triage nurse.   

## 2018-12-20 NOTE — Assessment & Plan Note (Signed)
1.  Recurrent IgG kappa multiple myeloma: - RVD from 04/07/2014 through 07/28/2014 followed by maintenance Revlimid, which was discontinued. - MRI of the brain on 02/09/2017 showed new right temporal lesion, status post radiation therapy, PET CT in January 2019 showed improvement of multiple myeloma with no new lesions, started on weekly Velcade and dexamethasone on 02/18/2017, status post 2 cycles, Velcade changed every other week on 05/06/2017 secondary to neuropathy. - MRI of the thoracic and lumbar spine on 09/03/2017 showed new rib lesion close to the spine on the left side, possibly eighth rib.  There was soft tissue mass in that area.  There is chronic L4 compression fracture which is stable. -Status post XRT to the left eighth rib lesion, 30 Gy in 10 fractions from 09/29/2017 through 10/14/2017. - DPD regimen started on 09/17/2017, developed erythematous maculopapular rash involving the trunk after taking 3 pills of pomalidomide. - Daratumumab restarted after radiation on 10/28/2017, pomalidomide completely discontinued on 02/12/2018 for recurrent rash. - He is continuing daratumumab every 4 weeks very well.  He is taking Decadron 10 mg weekly. -He was started on SubQ Dara on 10/25/18.   -He is tolerating subcu Dara well without any adverse reactions.   -Last myeloma labs on 10/25/2018 shows M spike of 0.1 g.  Light chain ratio is 2.77. -We have reviewed his labs.  Myeloma labs from today is pending.  He will proceed with his next treatment.  He will come back in 4 weeks for follow-up.  2.  Peripheral neuropathy: -We will continue gabapentin 3 times daily.  3.  Bone protection: -He is receiving Zometa every 8 weeks.  4.  Low back pain: -He is taking oxycodone 10 mg 3 tablets daily.  He reports worsening of pain. -He reports the pain relief lasting only 3 hours. -He was told to take oxycodone every 4-6 hours as needed.  5.  Difficulty falling asleep: -Ambien did not work.  Recommended  trazodone.  6.  Constipation secondary to pain medication -Senokot twice daily.

## 2018-12-20 NOTE — Patient Instructions (Addendum)
[facility] Discharge Instructions  You were seen today by Dr. Delton Coombes. He went over your recent lab results. He will see you back in 1 month for labs, treatment and follow up.   Thank you for choosing [place] [facility] to provide your oncology and hematology care.  To afford each patient quality time with our provider, please arrive at least 15 minutes before your scheduled appointment time.   If you have a lab appointment with the [facility] please come in thru the  Main Entrance and check in at the main information desk  You need to re-schedule your appointment should you arrive 10 or more minutes late.  We strive to give you quality time with our providers, and arriving late affects you and other patients whose appointments are after yours.  Also, if you no show three or more times for appointments you may be dismissed from the clinic at the providers discretion.     Again, thank you for choosing [facility].  Our hope is that these requests will decrease the amount of time that you wait before being seen by our physicians.       _____________________________________________________________  Should you have questions after your visit to [facility], please contact our office at [phone] between the hours of 8:00 a.m. and 4:30 p.m.  Voicemails left after 4:00 p.m. will not be returned until the following business day.  For prescription refill requests, have your pharmacy contact our office and allow 72 hours.    Cancer Center Support Programs:   > Cancer Support Group  2nd Tuesday of the month 1pm-2pm, Journey Room

## 2018-12-20 NOTE — Progress Notes (Signed)
Labs reviewed with MD today at office visit. Proceed with treatment today per MD.  Treatment given per orders. Patient tolerated it well without problems. Vitals stable and discharged home from clinic ambulatory. Follow up as scheduled.  

## 2018-12-20 NOTE — Progress Notes (Signed)
Dean [name], [place] [address]   CLINIC:  Medical Oncology/Hematology  PCP:  [name], MD [address] [place] [address] [phone]   REASON FOR VISIT:  Follow-up for  IgG kappa multiple myeloma   BRIEF ONCOLOGIC HISTORY:  Oncology History  Multiple myeloma without remission ([place])  11/16/2012 Initial Diagnosis   L occipital condyle destructive lesion, not biopsied secondary to location. XRT   12/01/2012 Imaging   L3 superior endplate compression FX, no lytic lesions to suggest myeloma   02/06/2014 Imaging   soft tissue mass along the right posterior fifth rib with some rib destruction   02/16/2014 Miscellaneous   Normal CBC, Normal CMP, kappa,lamda ratio of 2.62 (abnormal), UIEP with slightly restricted band, IgG kappa, SPEP/IEP with monoclonal protein at 0.79 g/dl, normal igG, suppressed IGM   02/20/2014 Bone Marrow Biopsy   33% kappa restricted plasma cells Normal FISH, normal cytogenetics   02/21/2014 - 03/08/2014 Radiation Therapy   30Gy to R rib lesion, lesion not biopsied   03/16/2014 PET scan   Scattered hypermetabolic osseous lesions, including the calvarium, ribs, left scapula, sacrum, and left femoral shaft. An expansile left vertebral body lesion at L3 extends into the epidural space of the left lateral recess,    03/17/2014 - 04/07/2014 Chemotherapy   Velcade and Dexamethasone due to initial denial of Revlimid by insurance company.  Zometa monthly.   03/22/2014 Imaging   MR_ L-Spine- Enhancing lesions compatible with multiple myeloma at L2, L3, and S1-2.   04/07/2014 - 07/28/2014 Chemotherapy   RVD with Revlimid at 25 mg days 1-14.  Revlimid dose reduced to 25 mg every other day x 14 days with 7 day respite beginning on day 1 of cycle 2.   04/17/2014 Adverse Reaction   Revlimid-induced rash.  Treated with steroids.   07/28/2014 - 08/04/2014 Chemotherapy   Revlimid daily   08/04/2014 Adverse Reaction   Velcade-induced peripheral neuropathy   08/04/2014 Treatment Plan Change   D/C Velcade   08/11/2014 PET scan   Response to therapy. Improvement and resolution of foci of osseous hypermetabolism.   08/22/2014 - 08/28/2014 Chemotherapy   Revlimid 25 mg days 1-21 every 28 days   08/28/2014 Adverse Reaction   Revlimid-induced rash. Medication held.  Medrol dose Pak prescribed.   09/04/2014 - 01/08/2015 Chemotherapy   Revlimid 10 mg every other day, without dexamethasone   01/08/2015 - 01/15/2015 Hospital Admission   Colonic diverticular abscess with IR drain placement   03/02/2015 PET scan   Only bony uptake is low activity at site of deformity/callus at R second rib FX, high activity in sigmoid colon, advanced sigmoid divertidulosis. Abscess not resolved?   06/04/2015 Imaging   CT nonobstructive L renal calculus, diverticulosis of descending and sigmoid colon without inflammation, moderate prostatic enlargement   07/12/2015 Surgery   Diverticulitis s/p robotic sigmoid colectomy with Dr. Johney Maine   08/23/2015 PET scan   Primarily similar hypermetabolic osseous foci within the R sided ribs. new L third rib focus of hypermetab and sclerosis is favored to be related to healing FX. no soft tissue myeloma id'd. Presumed postop hypermetab and edema about the R pelvic wall    03/06/2016 PET scan   1. Reduced activity in the prior lesion such as the right second and fifth rib lesions, activity nearly completely resolved and significantly less than added mediastinal blood pool activity. No new lesions are identified. 2. Other imaging findings of potential clinical significance: Coronary, aortic  arch, and branch vessel atherosclerotic vascular disease. Aortoiliac atherosclerotic vascular disease. Enlarged prostate gland. Colonic diverticula.   02/17/2017 PET scan   HEAD/NECK: No hypermetabolic activity in the scalp. No hypermetabolic cervical lymph nodes. CHEST: No hypermetabolic mediastinal or hilar nodes.  Right upper lobe scarring/atelectasis. No suspicious pulmonary nodules on the CT scan. ABDOMEN/PELVIS: No abnormal hypermetabolic activity within the liver, pancreas, adrenal glands, or spleen. No hypermetabolic lymph nodes in the abdomen or pelvis. Atherosclerotic calcifications of the abdominal aorta and branch vessels. Left renal sinus cysts. Sigmoid diverticulosis, without evidence of diverticulitis. Prostatomegaly. SKELETON: Vague hypermetabolism involving the inferior sternum, max SUV 3.2, previously 8.2. Focal hypermetabolism involving the left sacrum, max SUV 4.0, previously 6.7. EXTREMITIES: No abnormal hypermetabolic activity in the lower extremities.   02/18/2017 -  Chemotherapy   Bortezomib 1.'3mg'$ /m2 QWk + Dexamethasone '10mg'$  QTue/Fri --Cycle #1, 02/18/17    02/18/2017 - 08/26/2017 Chemotherapy   The patient had bortezomib SQ (VELCADE) chemo injection 3 mg, 1.3 mg/m2 = 3 mg, Subcutaneous,  Once, 7 of 9 cycles Administration: 3 mg (02/18/2017), 3 mg (02/26/2017), 3 mg (03/04/2017), 3 mg (03/11/2017), 3 mg (04/08/2017), 3 mg (03/18/2017), 3 mg (03/25/2017), 3 mg (04/01/2017), 3 mg (05/06/2017), 3 mg (05/20/2017), 3 mg (06/03/2017), 3 mg (06/17/2017), 3 mg (07/01/2017), 3 mg (07/15/2017), 3 mg (07/29/2017), 3 mg (08/12/2017), 3 mg (08/26/2017)  for chemotherapy treatment.    09/17/2017 -  Chemotherapy   The patient had daratumumab-hyaluronidase-fihj (DARZALEX FASPRO) 1800-30000 MG-UT/15ML chemo SQ injection 1,800 mg, 1,800 mg, Subcutaneous,  Once, 3 of 3 cycles Administration: 1,800 mg (10/25/2018), 1,800 mg (11/22/2018), 1,800 mg (12/20/2018) daratumumab (DARZALEX) 1,800 mg in sodium chloride 0.9 % 910 mL (1.8 mg/mL) chemo infusion, 15.9 mg/kg = 1,820 mg, Intravenous, Once, 1 of 1 cycle Administration: 1,800 mg (09/17/2017) daratumumab (DARZALEX) 1,800 mg in sodium chloride 0.9 % 410 mL (3.6 mg/mL) chemo infusion, 1,820 mg, Intravenous, Once, 13 of 13 cycles Administration: 1,800 mg (10/28/2017), 1,800 mg  (11/04/2017), 1,800 mg (11/11/2017), 1,800 mg (11/18/2017), 1,800 mg (11/25/2017), 1,800 mg (12/02/2017), 1,800 mg (12/09/2017), 1,800 mg (12/16/2017), 1,800 mg (12/30/2017), 1,800 mg (04/26/2018), 1,800 mg (01/13/2018), 1,800 mg (02/12/2018), 1,800 mg (02/26/2018), 1,800 mg (03/15/2018), 1,800 mg (03/29/2018), 1,800 mg (04/12/2018), 1,800 mg (05/10/2018), 1,800 mg (06/07/2018), 1,800 mg (07/05/2018), 1,800 mg (08/02/2018), 1,800 mg (08/30/2018), 1,800 mg (09/27/2018)  for chemotherapy treatment.       CANCER STAGING: Cancer Staging No matching staging information was found for the patient.   INTERVAL HISTORY:  Mr. Aro 83 y.o. male seen for follow-up of multiple myeloma.  He is continuing to tolerate monthly daratumumab very well.  Denies any back pain worsening.  Numbness in the hands and feet has been stable.  Appetite is 25%.  Energy levels are low.  Denies any sinus discharge.  Reports minor headaches and thinks it is coming from sinus problems.  Denies any fevers or chills.  No nausea, vomiting or diarrhea.    REVIEW OF SYSTEMS:  Review of Systems  Cardiovascular: Positive for leg swelling.  Neurological: Positive for headaches and numbness.  Psychiatric/Behavioral: Positive for sleep disturbance.  All other systems reviewed and are negative.    PAST MEDICAL/SURGICAL HISTORY:  Past Medical History:  Diagnosis Date  . BPH (benign prostatic hyperplasia)   . Colonic diverticular abscess 01/08/2015  . Diverticulitis 54/27/06   complicated by abscess and required percutaneous drainage  . GERD (gastroesophageal reflux disease)   . H/O ETOH abuse   . History of chemotherapy last done jan 2017  . History  of radiation therapy 01/05/13-02/10/13   45 gray to left occipital condyle region  . History of radiation therapy 12/01/16-12/10/16   Parasternal nodule, chest- 24 Gy total delivered in 8 fractions, Left sacro-iliac, pelvis- 24 Gy total delivered in 8 fractions   . History of radiation therapy  02/19/17-02/22/17   right temporal scalp 30 Gy in 10 fractions  . Intra-abdominal abscess ([place])   . Multiple myeloma ([place]) 2015  . Neuropathy   . Radiation 02/21/14-03/08/14   right posterior chest wall area 30 gray  . Radiation 04/19/14-05/02/14   lumbar spine 25 gray  . Skull lesion    Left occipital condyle  . Wrist fracture, left    x 2   Past Surgical History:  Procedure Laterality Date  . COLONOSCOPY N/A 04/19/2015   Procedure: COLONOSCOPY;  Surgeon: Danie Binder, MD;  Location: [facility];  Service: Endoscopy;  Laterality: N/A;  1:30 PM  . CYST EXCISION  1959   tail bone  . IR IMAGING GUIDED PORT INSERTION  09/25/2017     SOCIAL HISTORY:  Social History   Socioeconomic History  . Marital status: Married    Spouse name: Not on file  . Number of children: 3  . Years of education: Not on file  . Highest education level: Not on file  Occupational History    Employer: RETIRED  Social Needs  . Financial resource strain: Not on file  . Food insecurity    Worry: Not on file    Inability: Not on file  . Transportation needs    Medical: Not on file    Non-medical: Not on file  Tobacco Use  . Smoking status: Never Smoker  . Smokeless tobacco: Never Used  Substance and Sexual Activity  . Alcohol use: No    Comment: " Not much no more"  . Drug use: No  . Sexual activity: Not on file  Lifestyle  . Physical activity    Days per week: Not on file    Minutes per session: Not on file  . Stress: Not on file  Relationships  . Social Herbalist on phone: Not on file    Gets together: Not on file    Attends religious service: Not on file    Active member of club or organization: Not on file    Attends meetings of clubs or organizations: Not on file    Relationship status: Not on file  . Intimate partner violence    Fear of current or ex partner: Not on file    Emotionally abused: Not on file    Physically abused: Not on file    Forced sexual activity: Not  on file  Other Topics Concern  . Not on file  Social History Narrative  . Not on file    FAMILY HISTORY:  Family History  Problem Relation Age of Onset  . Diabetes Mother   . Stroke Mother   . Hypertension Father     CURRENT MEDICATIONS:  Outpatient Encounter Medications as of 12/20/2018  Medication Sig Note  . Oxycodone HCl 10 MG TABS Take 1 tablet (10 mg total) by mouth every 8 (eight) hours as needed. for pain   . acyclovir (ZOVIRAX) 400 MG tablet TAKE 1 TABLET BY MOUTH 2 TIMES DAILY   . ALPRAZolam (XANAX) 1 MG tablet TAKE 2 TABELETS BY MOUTH AT BEDTIME AS NEEDED FOR ANXIETY OR SLEEP (Patient not taking: Reported on 12/10/2018)   . CALCIUM-VITAMIN D PO Take 1  tablet by mouth 2 (two) times daily.    Marland Kitchen dexamethasone (DECADRON) 4 MG tablet Take 2.5 tablets (10 mg total) by mouth once a week. Take 10 mg once weekly 09/18/2017: Started on 09/18/17  . diclofenac Sodium (VOLTAREN) 1 % GEL Apply topically as needed. Pt has not picked up yet   . furosemide (LASIX) 20 MG tablet TAKE 1 TABLET BY MOUTH DAILY AS NEEDED FOR EDEMA. (Patient not taking: Reported on 11/30/2018)   . gabapentin (NEURONTIN) 400 MG capsule TAKE 1 CAPSULE BY MOUTH 3 TIMES DAILY   . lidocaine-prilocaine (EMLA) cream Apply small amount over port one (1) hour prior to appointment. (Patient not taking: Reported on 11/30/2018)   . MAGNESIUM PO Take 1 tablet by mouth at bedtime.   Marland Kitchen omeprazole (PRILOSEC) 20 MG capsule TAKE 1 CAPSULE BY MOUTH ONCE DAILY   . senna-docusate (SENOKOT-S) 8.6-50 MG tablet Take 1 tablet by mouth 2 (two) times daily.   . tamsulosin (FLOMAX) 0.4 MG CAPS capsule TAKE ONE CAPSULE BY MOUTH EVERY NIGHT ATBEDTIME   . tiZANidine (ZANAFLEX) 4 MG tablet Take 1 tablet (4 mg total) by mouth every 6 (six) hours as needed (headache). (Patient not taking: Reported on 11/30/2018)   . traZODone (DESYREL) 100 MG tablet Take 1 tablet (100 mg total) by mouth at bedtime. May titrate up to 300 mg nightly.   . zolendronic  acid (ZOMETA) 4 MG/5ML injection Inject 4 mg into the vein every 30 (thirty) days.     No facility-administered encounter medications on file as of 12/20/2018.     ALLERGIES:  Allergies  Allergen Reactions  . Codeine Other (See Comments)    Headache  . Morphine And Related Other (See Comments)    Makes him feel weird  . Revlimid [Lenalidomide] Other (See Comments)    "Causes me to become weak"     PHYSICAL EXAM:  ECOG Performance status: 1  Vitals:   12/20/18 0901  BP: 129/71  Pulse: 86  Resp: 20  Temp: (!) 97.1 F (36.2 C)  SpO2: 97%   Filed Weights   12/20/18 0901  Weight: 227 lb 3.2 oz (103.1 kg)    Physical Exam Vitals signs reviewed.  Constitutional:      Appearance: Normal appearance.  Cardiovascular:     Rate and Rhythm: Normal rate and regular rhythm.     Heart sounds: Normal heart sounds.  Pulmonary:     Effort: Pulmonary effort is normal.     Breath sounds: Normal breath sounds.  Abdominal:     General: There is no distension.     Palpations: Abdomen is soft. There is no mass.  Musculoskeletal:        General: No swelling.  Skin:    General: Skin is warm.  Neurological:     General: No focal deficit present.     Mental Status: He is alert and oriented to person, place, and time.  Psychiatric:        Mood and Affect: Mood normal.        Behavior: Behavior normal.      LABORATORY DATA:  I have reviewed the labs as listed.  CBC    Component Value Date/Time   WBC 5.1 12/20/2018 0902   RBC 4.88 12/20/2018 0902   HGB 14.8 12/20/2018 0902   HCT 45.0 12/20/2018 0902   PLT 124 (L) 12/20/2018 0902   MCV 92.2 12/20/2018 0902   MCH 30.3 12/20/2018 0902   MCHC 32.9 12/20/2018 0902   RDW 13.4 12/20/2018  0902   LYMPHSABS 1.1 12/20/2018 0902   MONOABS 0.5 12/20/2018 0902   EOSABS 0.0 12/20/2018 0902   BASOSABS 0.0 12/20/2018 0902   CMP Latest Ref Rng & Units 12/20/2018 11/30/2018 11/22/2018  Glucose 70 - 99 mg/dL 121(H) 101(H) 102(H)  BUN 8  - 23 mg/dL '22 21 16  '$ Creatinine 0.61 - 1.24 mg/dL 0.77 0.67 0.67  Sodium 135 - 145 mmol/L 140 140 142  Potassium 3.5 - 5.1 mmol/L 3.8 4.1 4.2  Chloride 98 - 111 mmol/L 105 108 108  CO2 22 - 32 mmol/L '25 26 27  '$ Calcium 8.9 - 10.3 mg/dL 8.9 8.3(L) 9.0  Total Protein 6.5 - 8.1 g/dL 5.7(L) 6.0(L) 5.6(L)  Total Bilirubin 0.3 - 1.2 mg/dL 0.9 0.7 0.4  Alkaline Phos 38 - 126 U/L 19(L) 23(L) 20(L)  AST 15 - 41 U/L '16 15 15  '$ ALT 0 - 44 U/L '13 15 15       '$ DIAGNOSTIC IMAGING:  I have independently reviewed the scans and discussed with the patient.   I have reviewed Venita Lick LPN's note and agree with the documentation.  I personally performed a face-to-face visit, made revisions and my assessment and plan is as follows.    ASSESSMENT & PLAN:   Multiple myeloma without remission ([place]) 1.  Recurrent IgG kappa multiple myeloma: - RVD from 04/07/2014 through 07/28/2014 followed by maintenance Revlimid, which was discontinued. - MRI of the brain on 02/09/2017 showed new right temporal lesion, status post radiation therapy, PET CT in January 2019 showed improvement of multiple myeloma with no new lesions, started on weekly Velcade and dexamethasone on 02/18/2017, status post 2 cycles, Velcade changed every other week on 05/06/2017 secondary to neuropathy. - MRI of the thoracic and lumbar spine on 09/03/2017 showed new rib lesion close to the spine on the left side, possibly eighth rib.  There was soft tissue mass in that area.  There is chronic L4 compression fracture which is stable. -Status post XRT to the left eighth rib lesion, 30 Gy in 10 fractions from 09/29/2017 through 10/14/2017. - DPD regimen started on 09/17/2017, developed erythematous maculopapular rash involving the trunk after taking 3 pills of pomalidomide. - Daratumumab restarted after radiation on 10/28/2017, pomalidomide completely discontinued on 02/12/2018 for recurrent rash. - He is continuing daratumumab every 4 weeks very well.  He is  taking Decadron 10 mg weekly. -He was started on SubQ Dara on 10/25/18.   -He is tolerating subcu Dara well without any adverse reactions.   -Last myeloma labs on 10/25/2018 shows M spike of 0.1 g.  Light chain ratio is 2.77. -We have reviewed his labs.  Myeloma labs from today is pending.  He will proceed with his next treatment.  He will come back in 4 weeks for follow-up.  2.  Peripheral neuropathy: -We will continue gabapentin 3 times daily.  3.  Bone protection: -He is receiving Zometa every 8 weeks.  4.  Low back pain: -He is taking oxycodone 10 mg 3 tablets daily.  He reports worsening of pain. -He reports the pain relief lasting only 3 hours. -He was told to take oxycodone every 4-6 hours as needed.  5.  Difficulty falling asleep: -Ambien did not work.  Recommended trazodone.  6.  Constipation secondary to pain medication -Senokot twice daily.    Orders placed this encounter:  Orders Placed This Encounter  Procedures  . CBC with Differential/Platelet  . Comprehensive metabolic panel  . Protein electrophoresis, serum  . Kappa/lambda light chains  .  Lactate dehydrogenase      Derek Jack, MD [place] [phone]

## 2018-12-21 LAB — KAPPA/LAMBDA LIGHT CHAINS
Kappa free light chain: 9.8 mg/L (ref 3.3–19.4)
Kappa, lambda light chain ratio: 2.65 — ABNORMAL HIGH (ref 0.26–1.65)
Lambda free light chains: 3.7 mg/L — ABNORMAL LOW (ref 5.7–26.3)

## 2018-12-22 LAB — MULTIPLE MYELOMA PANEL, SERUM
Albumin SerPl Elph-Mcnc: 3.3 g/dL (ref 2.9–4.4)
Albumin/Glob SerPl: 1.8 — ABNORMAL HIGH (ref 0.7–1.7)
Alpha 1: 0.2 g/dL (ref 0.0–0.4)
Alpha2 Glob SerPl Elph-Mcnc: 0.7 g/dL (ref 0.4–1.0)
B-Globulin SerPl Elph-Mcnc: 0.8 g/dL (ref 0.7–1.3)
Gamma Glob SerPl Elph-Mcnc: 0.2 g/dL — ABNORMAL LOW (ref 0.4–1.8)
Globulin, Total: 1.9 g/dL — ABNORMAL LOW (ref 2.2–3.9)
IgA: 27 mg/dL — ABNORMAL LOW (ref 61–437)
IgG (Immunoglobin G), Serum: 231 mg/dL — ABNORMAL LOW (ref 603–1613)
IgM (Immunoglobulin M), Srm: 13 mg/dL — ABNORMAL LOW (ref 15–143)
M Protein SerPl Elph-Mcnc: 0.1 g/dL — ABNORMAL HIGH
Total Protein ELP: 5.2 g/dL — ABNORMAL LOW (ref 6.0–8.5)

## 2019-01-03 ENCOUNTER — Other Ambulatory Visit: Payer: Self-pay | Admitting: Family Medicine

## 2019-01-03 DIAGNOSIS — G4700 Insomnia, unspecified: Secondary | ICD-10-CM

## 2019-01-03 DIAGNOSIS — C9000 Multiple myeloma not having achieved remission: Secondary | ICD-10-CM

## 2019-01-03 NOTE — Telephone Encounter (Signed)
Ok to refill??  Last office visit/ refill 11/09/2018.

## 2019-01-04 ENCOUNTER — Other Ambulatory Visit (HOSPITAL_COMMUNITY): Payer: Self-pay | Admitting: Emergency Medicine

## 2019-01-04 ENCOUNTER — Telehealth (HOSPITAL_COMMUNITY): Payer: Self-pay | Admitting: *Deleted

## 2019-01-04 DIAGNOSIS — C9000 Multiple myeloma not having achieved remission: Secondary | ICD-10-CM

## 2019-01-04 MED ORDER — OXYCODONE HCL 10 MG PO TABS: 10.0000 mg | ORAL_TABLET | Freq: Three times a day (TID) | ORAL | 0 refills | Status: DC | PRN

## 2019-01-17 ENCOUNTER — Encounter (HOSPITAL_COMMUNITY): Payer: Self-pay | Admitting: Hematology

## 2019-01-17 ENCOUNTER — Inpatient Hospital Stay (HOSPITAL_COMMUNITY): Payer: Medicare Other

## 2019-01-17 ENCOUNTER — Inpatient Hospital Stay (HOSPITAL_COMMUNITY): Payer: Medicare Other | Attending: Hematology | Admitting: Hematology

## 2019-01-17 ENCOUNTER — Other Ambulatory Visit: Payer: Self-pay

## 2019-01-17 VITALS — BP 126/67 | HR 74 | Temp 97.5°F | Resp 18 | Wt 228.8 lb

## 2019-01-17 DIAGNOSIS — C9000 Multiple myeloma not having achieved remission: Secondary | ICD-10-CM

## 2019-01-17 DIAGNOSIS — Z79899 Other long term (current) drug therapy: Secondary | ICD-10-CM | POA: Diagnosis not present

## 2019-01-17 DIAGNOSIS — Z5112 Encounter for antineoplastic immunotherapy: Secondary | ICD-10-CM | POA: Insufficient documentation

## 2019-01-17 LAB — CBC WITH DIFFERENTIAL/PLATELET
Abs Immature Granulocytes: 0.01 10*3/uL (ref 0.00–0.07)
Basophils Absolute: 0 10*3/uL (ref 0.0–0.1)
Basophils Relative: 0 %
Eosinophils Absolute: 0 10*3/uL (ref 0.0–0.5)
Eosinophils Relative: 0 %
HCT: 43 % (ref 39.0–52.0)
Hemoglobin: 14.1 g/dL (ref 13.0–17.0)
Immature Granulocytes: 0 %
Lymphocytes Relative: 24 %
Lymphs Abs: 1.1 10*3/uL (ref 0.7–4.0)
MCH: 30.3 pg (ref 26.0–34.0)
MCHC: 32.8 g/dL (ref 30.0–36.0)
MCV: 92.3 fL (ref 80.0–100.0)
Monocytes Absolute: 0.5 10*3/uL (ref 0.1–1.0)
Monocytes Relative: 10 %
Neutro Abs: 2.9 10*3/uL (ref 1.7–7.7)
Neutrophils Relative %: 66 %
Platelets: 129 10*3/uL — ABNORMAL LOW (ref 150–400)
RBC: 4.66 MIL/uL (ref 4.22–5.81)
RDW: 13.4 % (ref 11.5–15.5)
WBC: 4.5 10*3/uL (ref 4.0–10.5)
nRBC: 0 % (ref 0.0–0.2)

## 2019-01-17 LAB — COMPREHENSIVE METABOLIC PANEL
ALT: 13 U/L (ref 0–44)
AST: 15 U/L (ref 15–41)
Albumin: 3.4 g/dL — ABNORMAL LOW (ref 3.5–5.0)
Alkaline Phosphatase: 19 U/L — ABNORMAL LOW (ref 38–126)
Anion gap: 9 (ref 5–15)
BUN: 18 mg/dL (ref 8–23)
CO2: 27 mmol/L (ref 22–32)
Calcium: 8.8 mg/dL — ABNORMAL LOW (ref 8.9–10.3)
Chloride: 106 mmol/L (ref 98–111)
Creatinine, Ser: 0.83 mg/dL (ref 0.61–1.24)
GFR calc Af Amer: >60 mL/min (ref >60)
GFR calc non Af Amer: >60 mL/min (ref >60)
Glucose, Bld: 95 mg/dL (ref 70–99)
Potassium: 4.1 mmol/L (ref 3.5–5.1)
Sodium: 142 mmol/L (ref 135–145)
Total Bilirubin: 0.5 mg/dL (ref 0.3–1.2)
Total Protein: 5.5 g/dL — ABNORMAL LOW (ref 6.5–8.1)

## 2019-01-17 LAB — LACTATE DEHYDROGENASE: LDH: 123 U/L (ref 98–192)

## 2019-01-17 LAB — TSH: TSH: 0.82 u[IU]/mL (ref 0.350–4.500)

## 2019-01-17 MED ORDER — HEPARIN SOD (PORK) LOCK FLUSH 100 UNIT/ML IV SOLN
500.0000 [IU] | Freq: Once | INTRAVENOUS | Status: AC | PRN
  Administered 2019-01-17: 500 [IU]

## 2019-01-17 MED ORDER — DEXAMETHASONE 4 MG PO TABS
20.0000 mg | ORAL_TABLET | Freq: Once | ORAL | Status: AC
  Administered 2019-01-17: 20 mg via ORAL
  Filled 2019-01-17: qty 5

## 2019-01-17 MED ORDER — ACETAMINOPHEN 325 MG PO TABS
650.0000 mg | ORAL_TABLET | Freq: Once | ORAL | Status: AC
  Administered 2019-01-17: 650 mg via ORAL
  Filled 2019-01-17: qty 2

## 2019-01-17 MED ORDER — SODIUM CHLORIDE 0.9 % IV SOLN: 20.0000 mg | Freq: Once | INTRAVENOUS | Status: DC

## 2019-01-17 MED ORDER — SODIUM CHLORIDE 0.9 % IV SOLN
Freq: Once | INTRAVENOUS | Status: AC
  Administered 2019-01-17: 10:00:00 via INTRAVENOUS

## 2019-01-17 MED ORDER — DIPHENHYDRAMINE HCL 25 MG PO CAPS
50.0000 mg | ORAL_CAPSULE | Freq: Once | ORAL | Status: AC
  Administered 2019-01-17: 50 mg via ORAL
  Filled 2019-01-17: qty 2

## 2019-01-17 MED ORDER — DARATUMUMAB-HYALURONIDASE-FIHJ 1800-30000 MG-UT/15ML ~~LOC~~ SOLN
1800.0000 mg | Freq: Once | SUBCUTANEOUS | Status: AC
  Administered 2019-01-17: 11:00:00 1800 mg via SUBCUTANEOUS
  Filled 2019-01-17: qty 15

## 2019-01-17 MED ORDER — PROCHLORPERAZINE MALEATE 10 MG PO TABS
10.0000 mg | ORAL_TABLET | Freq: Once | ORAL | Status: AC
  Administered 2019-01-17: 10 mg via ORAL
  Filled 2019-01-17: qty 1

## 2019-01-17 MED ORDER — SODIUM CHLORIDE 0.9% FLUSH
10.0000 mL | INTRAVENOUS | Status: DC | PRN
  Administered 2019-01-17: 10 mL

## 2019-01-17 MED ORDER — ZOLEDRONIC ACID 4 MG/100ML IV SOLN
4.0000 mg | Freq: Once | INTRAVENOUS | Status: AC
  Administered 2019-01-17: 4 mg via INTRAVENOUS
  Filled 2019-01-17: qty 100

## 2019-01-17 NOTE — Patient Instructions (Signed)
[facility] Discharge Instructions for Patients Receiving Chemotherapy   Beginning January 23rd 2017 lab work for the [facility] will be done in the  Main lab at [facility] on 1st floor. If you have a lab appointment with the [facility] please come in thru the  Main Entrance and check in at the main information desk   Today you received the following chemotherapy agents Daratumumab and Zometa  To help prevent nausea and vomiting after your treatment, we encourage you to take your nausea medication    If you develop nausea and vomiting, or diarrhea that is not controlled by your medication, call the clinic.  The clinic phone number is [phone]. Office hours are Monday-Friday 8:30am-5:00pm.  BELOW ARE SYMPTOMS THAT SHOULD BE REPORTED IMMEDIATELY:  *FEVER GREATER THAN 101.0 F  *CHILLS WITH OR WITHOUT FEVER  NAUSEA AND VOMITING THAT IS NOT CONTROLLED WITH YOUR NAUSEA MEDICATION  *UNUSUAL SHORTNESS OF BREATH  *UNUSUAL BRUISING OR BLEEDING  TENDERNESS IN MOUTH AND THROAT WITH OR WITHOUT PRESENCE OF ULCERS  *URINARY PROBLEMS  *BOWEL PROBLEMS  UNUSUAL RASH Items with * indicate a potential emergency and should be followed up as soon as possible. If you have an emergency after office hours please contact your primary care physician or go to the nearest emergency department.  Please call the clinic during office hours if you have any questions or concerns.   You may also contact the Patient Navigator at [phone] should you have any questions or need assistance in obtaining follow up care.      Resources For Cancer Patients and their Caregivers ? American Cancer Society: Can assist with transportation, wigs, general needs, runs Look Good Feel Better.        860-249-1547 ? Cancer Care: Provides financial assistance, online support groups, medication/co-pay assistance.  1-800-813-HOPE 586-840-7242) ? [place] Assists  [place] cancer patients and their families through emotional , educational and financial support.  281-755-9797 ? [place] DSS Where to apply for food stamps, Medicaid and utility assistance. 765-851-1117 ? [facility]: Transportation to medical appointments. 757-826-6436 ? Social Security Administration: May apply for disability if have a Stage IV cancer. 210-181-1939 260-554-1095 ? [employer], Disability and Transit Services: Assists with nutrition, care and transit needs. [phone]

## 2019-01-17 NOTE — Progress Notes (Signed)
0950 Lab work reviewed and patient seen by Dr. Delton Coombes who approved patient for treatment today.  Dean [name]. tolerated treatment without incident or complaint. VSS. Discharged in satisfactory condition with follow up instructions.

## 2019-01-17 NOTE — Patient Instructions (Signed)
[facility] at [facility] Discharge Instructions  Labs drawn from portacath today   Thank you for choosing [facility] at [facility] to provide your oncology and hematology care.  To afford each patient quality time with our provider, please arrive at least 15 minutes before your scheduled appointment time.   If you have a lab appointment with the Cancer Center please come in thru the Main Entrance and check in at the main information desk.  You need to re-schedule your appointment should you arrive 10 or more minutes late.  We strive to give you quality time with our providers, and arriving late affects you and other patients whose appointments are after yours.  Also, if you no show three or more times for appointments you may be dismissed from the clinic at the providers discretion.     Again, thank you for choosing [facility].  Our hope is that these requests will decrease the amount of time that you wait before being seen by our physicians.       _____________________________________________________________  Should you have questions after your visit to [facility], please contact our office at [phone] between the hours of 8:00 a.m. and 4:30 p.m.  Voicemails left after 4:00 p.m. will not be returned until the following business day.  For prescription refill requests, have your pharmacy contact our office and allow 72 hours.    Due to Covid, you will need to wear a mask upon entering the hospital. If you do not have a mask, a mask will be given to you at the Main Entrance upon arrival. For doctor visits, patients may have 1 support person with them. For treatment visits, patients can not have anyone with them due to social distancing guidelines and our immunocompromised population.     

## 2019-01-17 NOTE — Addendum Note (Signed)
Addended by: Farley Ly on: 01/17/2019 04:49 PM   Modules accepted: Orders

## 2019-01-17 NOTE — Assessment & Plan Note (Signed)
1.  Recurrent IgG kappa multiple myeloma: - RVD from 04/07/2014 through 07/28/2014 followed by maintenance Revlimid, which was discontinued. - MRI of the brain on 02/09/2017 showed new right temporal lesion, status post radiation therapy, PET CT in January 2019 showed improvement of multiple myeloma with no new lesions, started on weekly Velcade and dexamethasone on 02/18/2017, status post 2 cycles, Velcade changed every other week on 05/06/2017 secondary to neuropathy. - MRI of the thoracic and lumbar spine on 09/03/2017 showed new rib lesion close to the spine on the left side, possibly eighth rib.  There was soft tissue mass in that area.  There is chronic L4 compression fracture which is stable. -Status post XRT to the left eighth rib lesion, 30 Gy in 10 fractions from 09/29/2017 through 10/14/2017. - DPD regimen started on 09/17/2017, developed erythematous maculopapular rash involving the trunk after taking 3 pills of pomalidomide. - Daratumumab restarted after radiation on 10/28/2017, pomalidomide completely discontinued on 02/12/2018 for recurrent rash. -He was started on SubQ Dara on 10/25/18.  He takes dexamethasone 10 mg weekly. -Skeletal survey on 11/30/2018 showed new expansion lesion at right first rib and left fifth rib. -We reviewed myeloma panel from 12/20/2018.  SPEP is stable at 0.1 g.  Free light chain ratio is 2.65. -Skeletal survey was compared to scans in 2019.  As his myeloma panel was stable, I did not recommend any change in therapy. -He will proceed with the daratumumab today.  We will reevaluate him in 4 weeks. -He is complaining of tiredness.  We will check his TSH level.   2.  Peripheral neuropathy: -He is continuing gabapentin 3 times a day.  3.  Bone protection: -He will continue Zometa every 8 weeks.  4.  Low back pain: -He will continue oxycodone 10 mg 3 times a day.  Pain is well controlled today.  5.  Difficulty falling asleep: -Ambien did not work.  He will continue  trazodone.  6.  Constipation secondary to pain medication -Senokot twice daily.

## 2019-01-17 NOTE — Progress Notes (Signed)
[place], [place] [address]   CLINIC:  Medical Oncology/Hematology  PCP:  [name], MD [address] [place] [address] [phone]   REASON FOR VISIT:  Follow-up for  IgG kappa multiple myeloma   BRIEF ONCOLOGIC HISTORY:  Oncology History  Multiple myeloma without remission ([place])  11/16/2012 Initial Diagnosis   L occipital condyle destructive lesion, not biopsied secondary to location. XRT   12/01/2012 Imaging   L3 superior endplate compression FX, no lytic lesions to suggest myeloma   02/06/2014 Imaging   soft tissue mass along the right posterior fifth rib with some rib destruction   02/16/2014 Miscellaneous   Normal CBC, Normal CMP, kappa,lamda ratio of 2.62 (abnormal), UIEP with slightly restricted band, IgG kappa, SPEP/IEP with monoclonal protein at 0.79 g/dl, normal igG, suppressed IGM   02/20/2014 Bone Marrow Biopsy   33% kappa restricted plasma cells Normal FISH, normal cytogenetics   02/21/2014 - 03/08/2014 Radiation Therapy   30Gy to R rib lesion, lesion not biopsied   03/16/2014 PET scan   Scattered hypermetabolic osseous lesions, including the calvarium, ribs, left scapula, sacrum, and left femoral shaft. An expansile left vertebral body lesion at L3 extends into the epidural space of the left lateral recess,    03/17/2014 - 04/07/2014 Chemotherapy   Velcade and Dexamethasone due to initial denial of Revlimid by insurance company.  Zometa monthly.   03/22/2014 Imaging   MR_ L-Spine- Enhancing lesions compatible with multiple myeloma at L2, L3, and S1-2.   04/07/2014 - 07/28/2014 Chemotherapy   RVD with Revlimid at 25 mg days 1-14.  Revlimid dose reduced to 25 mg every other day x 14 days with 7 day respite beginning on day 1 of cycle 2.   04/17/2014 Adverse Reaction   Revlimid-induced rash.  Treated with steroids.   07/28/2014 - 08/04/2014 Chemotherapy   Revlimid daily   08/04/2014 Adverse Reaction   Velcade-induced peripheral neuropathy   08/04/2014 Treatment Plan Change   D/C Velcade   08/11/2014 PET scan   Response to therapy. Improvement and resolution of foci of osseous hypermetabolism.   08/22/2014 - 08/28/2014 Chemotherapy   Revlimid 25 mg days 1-21 every 28 days   08/28/2014 Adverse Reaction   Revlimid-induced rash. Medication held.  Medrol dose Pak prescribed.   09/04/2014 - 01/08/2015 Chemotherapy   Revlimid 10 mg every other day, without dexamethasone   01/08/2015 - 01/15/2015 Hospital Admission   Colonic diverticular abscess with IR drain placement   03/02/2015 PET scan   Only bony uptake is low activity at site of deformity/callus at R second rib FX, high activity in sigmoid colon, advanced sigmoid divertidulosis. Abscess not resolved?   06/04/2015 Imaging   CT nonobstructive L renal calculus, diverticulosis of descending and sigmoid colon without inflammation, moderate prostatic enlargement   07/12/2015 Surgery   Diverticulitis s/p robotic sigmoid colectomy with Dr. Johney Maine   08/23/2015 PET scan   Primarily similar hypermetabolic osseous foci within the R sided ribs. new L third rib focus of hypermetab and sclerosis is favored to be related to healing FX. no soft tissue myeloma id'd. Presumed postop hypermetab and edema about the R pelvic wall    03/06/2016 PET scan   1. Reduced activity in the prior lesion such as the right second and fifth rib lesions, activity nearly completely resolved and significantly less than added mediastinal blood pool activity. No new lesions are identified. 2. Other imaging findings of potential clinical significance: Coronary, aortic  arch, and branch vessel atherosclerotic vascular disease. Aortoiliac atherosclerotic vascular disease. Enlarged prostate gland. Colonic diverticula.   02/17/2017 PET scan   HEAD/NECK: No hypermetabolic activity in the scalp. No hypermetabolic cervical lymph nodes. CHEST: No hypermetabolic mediastinal or hilar nodes.  Right upper lobe scarring/atelectasis. No suspicious pulmonary nodules on the CT scan. ABDOMEN/PELVIS: No abnormal hypermetabolic activity within the liver, pancreas, adrenal glands, or spleen. No hypermetabolic lymph nodes in the abdomen or pelvis. Atherosclerotic calcifications of the abdominal aorta and branch vessels. Left renal sinus cysts. Sigmoid diverticulosis, without evidence of diverticulitis. Prostatomegaly. SKELETON: Vague hypermetabolism involving the inferior sternum, max SUV 3.2, previously 8.2. Focal hypermetabolism involving the left sacrum, max SUV 4.0, previously 6.7. EXTREMITIES: No abnormal hypermetabolic activity in the lower extremities.   02/18/2017 -  Chemotherapy   Bortezomib 1.'3mg'$ /m2 QWk + Dexamethasone '10mg'$  QTue/Fri --Cycle #1, 02/18/17    02/18/2017 - 08/26/2017 Chemotherapy   The patient had bortezomib SQ (VELCADE) chemo injection 3 mg, 1.3 mg/m2 = 3 mg, Subcutaneous,  Once, 7 of 9 cycles Administration: 3 mg (02/18/2017), 3 mg (02/26/2017), 3 mg (03/04/2017), 3 mg (03/11/2017), 3 mg (04/08/2017), 3 mg (03/18/2017), 3 mg (03/25/2017), 3 mg (04/01/2017), 3 mg (05/06/2017), 3 mg (05/20/2017), 3 mg (06/03/2017), 3 mg (06/17/2017), 3 mg (07/01/2017), 3 mg (07/15/2017), 3 mg (07/29/2017), 3 mg (08/12/2017), 3 mg (08/26/2017)  for chemotherapy treatment.    09/17/2017 -  Chemotherapy   The patient had daratumumab-hyaluronidase-fihj (DARZALEX FASPRO) 1800-30000 MG-UT/15ML chemo SQ injection 1,800 mg, 1,800 mg, Subcutaneous,  Once, 4 of 6 cycles Administration: 1,800 mg (10/25/2018), 1,800 mg (11/22/2018), 1,800 mg (12/20/2018), 1,800 mg (01/17/2019) daratumumab (DARZALEX) 1,800 mg in sodium chloride 0.9 % 910 mL (1.8 mg/mL) chemo infusion, 15.9 mg/kg = 1,820 mg, Intravenous, Once, 1 of 1 cycle Administration: 1,800 mg (09/17/2017) daratumumab (DARZALEX) 1,800 mg in sodium chloride 0.9 % 410 mL (3.6 mg/mL) chemo infusion, 1,820 mg, Intravenous, Once, 13 of 13 cycles Administration: 1,800 mg  (10/28/2017), 1,800 mg (11/04/2017), 1,800 mg (11/11/2017), 1,800 mg (11/18/2017), 1,800 mg (11/25/2017), 1,800 mg (12/02/2017), 1,800 mg (12/09/2017), 1,800 mg (12/16/2017), 1,800 mg (12/30/2017), 1,800 mg (04/26/2018), 1,800 mg (01/13/2018), 1,800 mg (02/12/2018), 1,800 mg (02/26/2018), 1,800 mg (03/15/2018), 1,800 mg (03/29/2018), 1,800 mg (04/12/2018), 1,800 mg (05/10/2018), 1,800 mg (06/07/2018), 1,800 mg (07/05/2018), 1,800 mg (08/02/2018), 1,800 mg (08/30/2018), 1,800 mg (09/27/2018)  for chemotherapy treatment.       CANCER STAGING: Cancer Staging No matching staging information was found for the patient.   INTERVAL HISTORY:  Mr. Bulson 83 y.o. male seen for multiple myeloma and toxicity assessment.  Appetite is 25%.  Energy levels are low.  Mild fatigue reported today.  Chronic headaches have been stable.  Denies any fevers or chills.  Denies any nausea, vomiting, diarrhea or constipation.  Reported ankle swellings at home.  Weight has been stable.  Denies any ER visits or hospitalizations.  Reports that back pain has been very well controlled in the last few days.    REVIEW OF SYSTEMS:  Review of Systems  Constitutional: Positive for fatigue.  Cardiovascular: Positive for leg swelling.  Neurological: Positive for headaches and numbness.  All other systems reviewed and are negative.    PAST MEDICAL/SURGICAL HISTORY:  Past Medical History:  Diagnosis Date  . BPH (benign prostatic hyperplasia)   . Colonic diverticular abscess 01/08/2015  . Diverticulitis 41/66/06   complicated by abscess and required percutaneous drainage  . GERD (gastroesophageal reflux disease)   . H/O ETOH abuse   . History of chemotherapy last  done jan 2017  . History of radiation therapy 01/05/13-02/10/13   45 gray to left occipital condyle region  . History of radiation therapy 12/01/16-12/10/16   Parasternal nodule, chest- 24 Gy total delivered in 8 fractions, Left sacro-iliac, pelvis- 24 Gy total delivered in 8 fractions    . History of radiation therapy 02/19/17-02/22/17   right temporal scalp 30 Gy in 10 fractions  . Intra-abdominal abscess ([place])   . Multiple myeloma ([place]) 2015  . Neuropathy   . Radiation 02/21/14-03/08/14   right posterior chest wall area 30 gray  . Radiation 04/19/14-05/02/14   lumbar spine 25 gray  . Skull lesion    Left occipital condyle  . Wrist fracture, left    x 2   Past Surgical History:  Procedure Laterality Date  . COLONOSCOPY N/A 04/19/2015   Procedure: COLONOSCOPY;  Surgeon: Danie Binder, MD;  Location: [facility];  Service: Endoscopy;  Laterality: N/A;  1:30 PM  . CYST EXCISION  1959   tail bone  . IR IMAGING GUIDED PORT INSERTION  09/25/2017     SOCIAL HISTORY:  Social History   Socioeconomic History  . Marital status: Married    Spouse name: Not on file  . Number of children: 3  . Years of education: Not on file  . Highest education level: Not on file  Occupational History    Employer: RETIRED  Tobacco Use  . Smoking status: Never Smoker  . Smokeless tobacco: Never Used  Substance and Sexual Activity  . Alcohol use: No    Comment: " Not much no more"  . Drug use: No  . Sexual activity: Not on file  Other Topics Concern  . Not on file  Social History Narrative  . Not on file   Social Determinants of Health   Financial Resource Strain:   . Difficulty of Paying Living Expenses: Not on file  Food Insecurity:   . Worried About Charity fundraiser in the Last Year: Not on file  . Ran Out of Food in the Last Year: Not on file  Transportation Needs:   . Lack of Transportation (Medical): Not on file  . Lack of Transportation (Non-Medical): Not on file  Physical Activity:   . Days of Exercise per Week: Not on file  . Minutes of Exercise per Session: Not on file  Stress:   . Feeling of Stress : Not on file  Social Connections:   . Frequency of Communication with Friends and Family: Not on file  . Frequency of Social Gatherings with Friends and  Family: Not on file  . Attends Religious Services: Not on file  . Active Member of Clubs or Organizations: Not on file  . Attends Archivist Meetings: Not on file  . Marital Status: Not on file  Intimate Partner Violence:   . Fear of Current or Ex-Partner: Not on file  . Emotionally Abused: Not on file  . Physically Abused: Not on file  . Sexually Abused: Not on file    FAMILY HISTORY:  Family History  Problem Relation Age of Onset  . Diabetes Mother   . Stroke Mother   . Hypertension Father     CURRENT MEDICATIONS:  Outpatient Encounter Medications as of 01/17/2019  Medication Sig Note  . acyclovir (ZOVIRAX) 400 MG tablet TAKE 1 TABLET BY MOUTH 2 TIMES DAILY   . ALPRAZolam (XANAX) 1 MG tablet TAKE 2 TABLETS BY MOUTH AT BEDTIME AS NEEDED FOR ANXIETY OR SLEEP   .  CALCIUM-VITAMIN D PO Take 1 tablet by mouth 2 (two) times daily.    Marland Kitchen dexamethasone (DECADRON) 4 MG tablet Take 2.5 tablets (10 mg total) by mouth once a week. Take 10 mg once weekly 09/18/2017: Started on 09/18/17  . diclofenac Sodium (VOLTAREN) 1 % GEL Apply topically as needed. Pt has not picked up yet   . furosemide (LASIX) 20 MG tablet TAKE 1 TABLET BY MOUTH DAILY AS NEEDED FOR EDEMA. (Patient not taking: Reported on 11/30/2018)   . gabapentin (NEURONTIN) 400 MG capsule TAKE 1 CAPSULE BY MOUTH 3 TIMES DAILY   . lidocaine-prilocaine (EMLA) cream Apply small amount over port one (1) hour prior to appointment. (Patient not taking: Reported on 11/30/2018)   . MAGNESIUM PO Take 1 tablet by mouth at bedtime.   Marland Kitchen omeprazole (PRILOSEC) 20 MG capsule TAKE 1 CAPSULE BY MOUTH ONCE DAILY   . Oxycodone HCl 10 MG TABS Take 1 tablet (10 mg total) by mouth every 8 (eight) hours as needed. for pain   . senna-docusate (SENOKOT-S) 8.6-50 MG tablet Take 1 tablet by mouth 2 (two) times daily.   . tamsulosin (FLOMAX) 0.4 MG CAPS capsule TAKE ONE CAPSULE BY MOUTH EVERY NIGHT ATBEDTIME   . tiZANidine (ZANAFLEX) 4 MG tablet Take 1  tablet (4 mg total) by mouth every 6 (six) hours as needed (headache). (Patient not taking: Reported on 11/30/2018)   . traZODone (DESYREL) 100 MG tablet Take 1 tablet (100 mg total) by mouth at bedtime. May titrate up to 300 mg nightly.   . zolendronic acid (ZOMETA) 4 MG/5ML injection Inject 4 mg into the vein every 30 (thirty) days.     No facility-administered encounter medications on file as of 01/17/2019.    ALLERGIES:  Allergies  Allergen Reactions  . Codeine Other (See Comments)    Headache  . Morphine And Related Other (See Comments)    Makes him feel weird  . Revlimid [Lenalidomide] Other (See Comments)    "Causes me to become weak"     PHYSICAL EXAM:  ECOG Performance status: 1  Vitals:   01/17/19 0856  BP: 126/67  Pulse: 74  Resp: 18  Temp: (!) 97.5 F (36.4 C)  SpO2: 96%   Filed Weights   01/17/19 0856  Weight: 228 lb 12.8 oz (103.8 kg)    Physical Exam Vitals reviewed.  Constitutional:      Appearance: Normal appearance.  Cardiovascular:     Rate and Rhythm: Normal rate and regular rhythm.     Heart sounds: Normal heart sounds.  Pulmonary:     Effort: Pulmonary effort is normal.     Breath sounds: Normal breath sounds.  Abdominal:     General: There is no distension.     Palpations: Abdomen is soft. There is no mass.  Musculoskeletal:        General: No swelling.  Skin:    General: Skin is warm.  Neurological:     General: No focal deficit present.     Mental Status: He is alert and oriented to person, place, and time.  Psychiatric:        Mood and Affect: Mood normal.        Behavior: Behavior normal.      LABORATORY DATA:  I have reviewed the labs as listed.  CBC    Component Value Date/Time   WBC 4.5 01/17/2019 0854   RBC 4.66 01/17/2019 0854   HGB 14.1 01/17/2019 0854   HCT 43.0 01/17/2019 0854   PLT  129 (L) 01/17/2019 0854   MCV 92.3 01/17/2019 0854   MCH 30.3 01/17/2019 0854   MCHC 32.8 01/17/2019 0854   RDW 13.4  01/17/2019 0854   LYMPHSABS 1.1 01/17/2019 0854   MONOABS 0.5 01/17/2019 0854   EOSABS 0.0 01/17/2019 0854   BASOSABS 0.0 01/17/2019 0854   CMP Latest Ref Rng & Units 01/17/2019 12/20/2018 11/30/2018  Glucose 70 - 99 mg/dL 95 121(H) 101(H)  BUN 8 - 23 mg/dL '18 22 21  '$ Creatinine 0.61 - 1.24 mg/dL 0.83 0.77 0.67  Sodium 135 - 145 mmol/L 142 140 140  Potassium 3.5 - 5.1 mmol/L 4.1 3.8 4.1  Chloride 98 - 111 mmol/L 106 105 108  CO2 22 - 32 mmol/L '27 25 26  '$ Calcium 8.9 - 10.3 mg/dL 8.8(L) 8.9 8.3(L)  Total Protein 6.5 - 8.1 g/dL 5.5(L) 5.7(L) 6.0(L)  Total Bilirubin 0.3 - 1.2 mg/dL 0.5 0.9 0.7  Alkaline Phos 38 - 126 U/L 19(L) 19(L) 23(L)  AST 15 - 41 U/L '15 16 15  '$ ALT 0 - 44 U/L '13 13 15       '$ DIAGNOSTIC IMAGING:  I have independently reviewed the scans and discussed with the patient.   I have reviewed Venita Lick LPN's note and agree with the documentation.  I personally performed a face-to-face visit, made revisions and my assessment and plan is as follows.    ASSESSMENT & PLAN:   Multiple myeloma without remission ([place]) 1.  Recurrent IgG kappa multiple myeloma: - RVD from 04/07/2014 through 07/28/2014 followed by maintenance Revlimid, which was discontinued. - MRI of the brain on 02/09/2017 showed new right temporal lesion, status post radiation therapy, PET CT in January 2019 showed improvement of multiple myeloma with no new lesions, started on weekly Velcade and dexamethasone on 02/18/2017, status post 2 cycles, Velcade changed every other week on 05/06/2017 secondary to neuropathy. - MRI of the thoracic and lumbar spine on 09/03/2017 showed new rib lesion close to the spine on the left side, possibly eighth rib.  There was soft tissue mass in that area.  There is chronic L4 compression fracture which is stable. -Status post XRT to the left eighth rib lesion, 30 Gy in 10 fractions from 09/29/2017 through 10/14/2017. - DPD regimen started on 09/17/2017, developed erythematous  maculopapular rash involving the trunk after taking 3 pills of pomalidomide. - Daratumumab restarted after radiation on 10/28/2017, pomalidomide completely discontinued on 02/12/2018 for recurrent rash. -He was started on SubQ Dara on 10/25/18.  He takes dexamethasone 10 mg weekly. -Skeletal survey on 11/30/2018 showed new expansion lesion at right first rib and left fifth rib. -We reviewed myeloma panel from 12/20/2018.  SPEP is stable at 0.1 g.  Free light chain ratio is 2.65. -Skeletal survey was compared to scans in 2019.  As his myeloma panel was stable, I did not recommend any change in therapy. -He will proceed with the daratumumab today.  We will reevaluate him in 4 weeks. -He is complaining of tiredness.  We will check his TSH level.   2.  Peripheral neuropathy: -He is continuing gabapentin 3 times a day.  3.  Bone protection: -He will continue Zometa every 8 weeks.  4.  Low back pain: -He will continue oxycodone 10 mg 3 times a day.  Pain is well controlled today.  5.  Difficulty falling asleep: -Ambien did not work.  He will continue trazodone.  6.  Constipation secondary to pain medication -Senokot twice daily.    Orders placed this encounter:  Orders Placed This Encounter  Procedures  . TSH      Derek Jack, [place] [phone]

## 2019-01-17 NOTE — Patient Instructions (Addendum)
[place] at [facility] Discharge Instructions  You were seen today by Dr. Delton Coombes. He went over your recent lab results. You will get treatment today. He will see you back in 4 weeks for labs, treatment and follow up.   Thank you for choosing [place] at [facility] to provide your oncology and hematology care.  To afford each patient quality time with our provider, please arrive at least 15 minutes before your scheduled appointment time.   If you have a lab appointment with the [facility] please come in thru the  Main Entrance and check in at the main information desk  You need to re-schedule your appointment should you arrive 10 or more minutes late.  We strive to give you quality time with our providers, and arriving late affects you and other patients whose appointments are after yours.  Also, if you no show three or more times for appointments you may be dismissed from the clinic at the providers discretion.     Again, thank you for choosing [facility].  Our hope is that these requests will decrease the amount of time that you wait before being seen by our physicians.       _____________________________________________________________  Should you have questions after your visit to [facility], please contact our office at [phone] between the hours of 8:00 a.m. and 4:30 p.m.  Voicemails left after 4:00 p.m. will not be returned until the following business day.  For prescription refill requests, have your pharmacy contact our office and allow 72 hours.    Cancer Center Support Programs:   > Cancer Support Group  2nd Tuesday of the month 1pm-2pm, Journey Room

## 2019-01-18 LAB — KAPPA/LAMBDA LIGHT CHAINS
Kappa free light chain: 10.3 mg/L (ref 3.3–19.4)
Kappa, lambda light chain ratio: 3.03 — ABNORMAL HIGH (ref 0.26–1.65)
Lambda free light chains: 3.4 mg/L — ABNORMAL LOW (ref 5.7–26.3)

## 2019-01-19 LAB — PROTEIN ELECTROPHORESIS, SERUM
A/G Ratio: 1.6 (ref 0.7–1.7)
Albumin ELP: 3.2 g/dL (ref 2.9–4.4)
Alpha-1-Globulin: 0.2 g/dL (ref 0.0–0.4)
Alpha-2-Globulin: 0.7 g/dL (ref 0.4–1.0)
Beta Globulin: 0.7 g/dL (ref 0.7–1.3)
Gamma Globulin: 0.3 g/dL — ABNORMAL LOW (ref 0.4–1.8)
Globulin, Total: 2 g/dL — ABNORMAL LOW (ref 2.2–3.9)
M-Spike, %: 0.1 g/dL — ABNORMAL HIGH
Total Protein ELP: 5.2 g/dL — ABNORMAL LOW (ref 6.0–8.5)

## 2019-01-31 ENCOUNTER — Other Ambulatory Visit (HOSPITAL_COMMUNITY): Payer: Self-pay | Admitting: Hematology

## 2019-01-31 ENCOUNTER — Other Ambulatory Visit: Payer: Self-pay | Admitting: Family Medicine

## 2019-01-31 DIAGNOSIS — G4700 Insomnia, unspecified: Secondary | ICD-10-CM

## 2019-01-31 DIAGNOSIS — C9000 Multiple myeloma not having achieved remission: Secondary | ICD-10-CM

## 2019-01-31 NOTE — Telephone Encounter (Signed)
Ok to refill??  Last office visit 11/09/2018.  Last refill 01/03/2019.  Ok to add refills to prescription?

## 2019-02-07 ENCOUNTER — Encounter: Payer: Self-pay | Admitting: Family Medicine

## 2019-02-07 ENCOUNTER — Other Ambulatory Visit: Payer: Self-pay

## 2019-02-07 ENCOUNTER — Ambulatory Visit (INDEPENDENT_AMBULATORY_CARE_PROVIDER_SITE_OTHER): Payer: Medicare Other | Admitting: Family Medicine

## 2019-02-07 VITALS — BP 108/68 | HR 86 | Temp 96.3°F | Resp 16 | Ht 69.0 in | Wt 222.0 lb

## 2019-02-07 DIAGNOSIS — H6591 Unspecified nonsuppurative otitis media, right ear: Secondary | ICD-10-CM

## 2019-02-07 DIAGNOSIS — H6981 Other specified disorders of Eustachian tube, right ear: Secondary | ICD-10-CM | POA: Diagnosis not present

## 2019-02-07 MED ORDER — AMOXICILLIN-POT CLAVULANATE 875-125 MG PO TABS: 1.0000 | ORAL_TABLET | Freq: Two times a day (BID) | ORAL | 0 refills | Status: DC

## 2019-02-07 MED ORDER — PREDNISONE 20 MG PO TABS: ORAL_TABLET | ORAL | 0 refills | Status: DC

## 2019-02-07 NOTE — Progress Notes (Signed)
Subjective:    Patient ID: Dean Danker., male    DOB: July 19, 1935, 84 y.o.   MRN: [id number]  HPI Patient reports a 1 month history of pain in his right ear.  He reports a pressure-like sensation in his right ear.  He reports decreased hearing in his right ear.  On examination today the tympanic membrane is clear however there is obvious middle ear effusion with small air bubbles trapped behind the tympanic membrane.  Patient also reports sinus congestion and sinus pressure and postnasal drip.  He reports dull sinus headaches constantly.  He also reports that his right eye is constantly matted every morning.  He has a clogged tear duct in his right eye with increased eye watering visible today on exam.  I suspect eustachian tube dysfunction and lacrimal duct obstruction due to sinusitis.  He reports constant rhinorrhea that has been present for 2 months and a constant dull headache on the right side. Past Medical History:  Diagnosis Date  . BPH (benign prostatic hyperplasia)   . Colonic diverticular abscess 01/08/2015  . Diverticulitis 54/00/86   complicated by abscess and required percutaneous drainage  . GERD (gastroesophageal reflux disease)   . H/O ETOH abuse   . History of chemotherapy last done jan 2017  . History of radiation therapy 01/05/13-02/10/13   45 gray to left occipital condyle region  . History of radiation therapy 12/01/16-12/10/16   Parasternal nodule, chest- 24 Gy total delivered in 8 fractions, Left sacro-iliac, pelvis- 24 Gy total delivered in 8 fractions   . History of radiation therapy 02/19/17-02/22/17   right temporal scalp 30 Gy in 10 fractions  . Intra-abdominal abscess ([place])   . Multiple myeloma ([place]) 2015  . Neuropathy   . Radiation 02/21/14-03/08/14   right posterior chest wall area 30 gray  . Radiation 04/19/14-05/02/14   lumbar spine 25 gray  . Skull lesion    Left occipital condyle  . Wrist fracture, left    x 2   Past Surgical History:  Procedure  Laterality Date  . COLONOSCOPY N/A 04/19/2015   Procedure: COLONOSCOPY;  Surgeon: Danie Binder, MD;  Location: [facility];  Service: Endoscopy;  Laterality: N/A;  1:30 PM  . CYST EXCISION  1959   tail bone  . IR IMAGING GUIDED PORT INSERTION  09/25/2017   Current Outpatient Medications on File Prior to Visit  Medication Sig Dispense Refill  . acyclovir (ZOVIRAX) 400 MG tablet TAKE 1 TABLET BY MOUTH 2 TIMES DAILY 60 tablet 3  . ALPRAZolam (XANAX) 1 MG tablet TAKE 2 TABLETS BY MOUTH AT BEDTIME AS NEEDED FOR ANXIETY OR SLEEP 60 tablet 3  . CALCIUM-VITAMIN D PO Take 1 tablet by mouth 2 (two) times daily.     Marland Kitchen dexamethasone (DECADRON) 4 MG tablet Take 2.5 tablets (10 mg total) by mouth once a week. Take 10 mg once weekly 10 tablet 12  . diclofenac Sodium (VOLTAREN) 1 % GEL Apply topically as needed. Pt has not picked up yet    . gabapentin (NEURONTIN) 400 MG capsule TAKE 1 CAPSULE BY MOUTH 3 TIMES DAILY 90 capsule 4  . MAGNESIUM PO Take 1 tablet by mouth at bedtime.    Marland Kitchen omeprazole (PRILOSEC) 20 MG capsule TAKE 1 CAPSULE BY MOUTH ONCE DAILY 30 capsule 5  . Oxycodone HCl 10 MG TABS TAKE 1 TABLET BY MOUTH EVERY 8 HOURS AS NEEDED FOR PAIN 120 tablet 0  . senna-docusate (SENOKOT-S) 8.6-50 MG tablet Take 1 tablet by  mouth 2 (two) times daily. 60 tablet 5  . tamsulosin (FLOMAX) 0.4 MG CAPS capsule TAKE ONE CAPSULE BY MOUTH EVERY NIGHT ATBEDTIME 30 capsule 11  . traZODone (DESYREL) 100 MG tablet Take 1 tablet (100 mg total) by mouth at bedtime. May titrate up to 300 mg nightly. 60 tablet 3  . zolendronic acid (ZOMETA) 4 MG/5ML injection Inject 4 mg into the vein every 30 (thirty) days.     . furosemide (LASIX) 20 MG tablet TAKE 1 TABLET BY MOUTH DAILY AS NEEDED FOR EDEMA. (Patient not taking: Reported on 11/30/2018) 30 tablet 0  . lidocaine-prilocaine (EMLA) cream Apply small amount over port one (1) hour prior to appointment. (Patient not taking: Reported on 11/30/2018) 30 g 0  . tiZANidine  (ZANAFLEX) 4 MG tablet Take 1 tablet (4 mg total) by mouth every 6 (six) hours as needed (headache). (Patient not taking: Reported on 11/30/2018) 30 tablet 0   No current facility-administered medications on file prior to visit.   Allergies  Allergen Reactions  . Codeine Other (See Comments)    Headache  . Morphine And Related Other (See Comments)    Makes him feel weird  . Revlimid [Lenalidomide] Other (See Comments)    "Causes me to become weak"   Social History   Socioeconomic History  . Marital status: Married    Spouse name: Not on file  . Number of children: 3  . Years of education: Not on file  . Highest education level: Not on file  Occupational History    Employer: RETIRED  Tobacco Use  . Smoking status: Never Smoker  . Smokeless tobacco: Never Used  Substance and Sexual Activity  . Alcohol use: No    Comment: " Not much no more"  . Drug use: No  . Sexual activity: Not on file  Other Topics Concern  . Not on file  Social History Narrative  . Not on file   Social Determinants of Health   Financial Resource Strain:   . Difficulty of Paying Living Expenses: Not on file  Food Insecurity:   . Worried About Charity fundraiser in the Last Year: Not on file  . Ran Out of Food in the Last Year: Not on file  Transportation Needs:   . Lack of Transportation (Medical): Not on file  . Lack of Transportation (Non-Medical): Not on file  Physical Activity:   . Days of Exercise per Week: Not on file  . Minutes of Exercise per Session: Not on file  Stress:   . Feeling of Stress : Not on file  Social Connections:   . Frequency of Communication with Friends and Family: Not on file  . Frequency of Social Gatherings with Friends and Family: Not on file  . Attends Religious Services: Not on file  . Active Member of Clubs or Organizations: Not on file  . Attends Archivist Meetings: Not on file  . Marital Status: Not on file  Intimate Partner Violence:   . Fear  of Current or Ex-Partner: Not on file  . Emotionally Abused: Not on file  . Physically Abused: Not on file  . Sexually Abused: Not on file      Review of Systems     Objective:   Physical Exam Vitals reviewed.  Constitutional:      General: He is not in acute distress.    Appearance: He is obese. He is not ill-appearing or toxic-appearing.  HENT:     Right Ear: Ear canal  normal. A middle ear effusion is present. Tympanic membrane is retracted.     Nose: Mucosal edema present.     Right Sinus: Frontal sinus tenderness present. No maxillary sinus tenderness.  Cardiovascular:     Rate and Rhythm: Normal rate and regular rhythm.     Heart sounds: Normal heart sounds.  Pulmonary:     Effort: Pulmonary effort is normal. No respiratory distress.     Breath sounds: Normal breath sounds. No wheezing, rhonchi or rales.  Neurological:     General: No focal deficit present.     Mental Status: He is alert and oriented to person, place, and time.     Cranial Nerves: No cranial nerve deficit.     Motor: No weakness.           Assessment & Plan:  Eustachian tube dysfunction, right  Middle ear effusion, right  I believe the patient has a chronic sinus infection causing right eustachian tube dysfunction as well as lacrimal duct obstruction.  Begin prednisone taper pack coupled with Augmentin 875 mg p.o. twice daily for 10 days.  Reassess in 2 weeks.

## 2019-02-14 ENCOUNTER — Other Ambulatory Visit (HOSPITAL_COMMUNITY): Payer: Medicare Other

## 2019-02-14 ENCOUNTER — Ambulatory Visit (HOSPITAL_COMMUNITY): Payer: Medicare Other

## 2019-02-14 ENCOUNTER — Ambulatory Visit (HOSPITAL_COMMUNITY): Payer: Medicare Other | Admitting: Hematology

## 2019-02-15 ENCOUNTER — Other Ambulatory Visit: Payer: Self-pay

## 2019-02-16 ENCOUNTER — Inpatient Hospital Stay (HOSPITAL_BASED_OUTPATIENT_CLINIC_OR_DEPARTMENT_OTHER): Payer: Medicare Other | Admitting: Hematology

## 2019-02-16 ENCOUNTER — Inpatient Hospital Stay (HOSPITAL_COMMUNITY): Payer: Medicare Other

## 2019-02-16 ENCOUNTER — Inpatient Hospital Stay (HOSPITAL_COMMUNITY): Payer: Medicare Other | Attending: Hematology

## 2019-02-16 VITALS — BP 110/56 | HR 60 | Temp 97.0°F | Resp 18

## 2019-02-16 DIAGNOSIS — C9000 Multiple myeloma not having achieved remission: Secondary | ICD-10-CM

## 2019-02-16 DIAGNOSIS — Z5112 Encounter for antineoplastic immunotherapy: Secondary | ICD-10-CM | POA: Diagnosis not present

## 2019-02-16 LAB — CBC WITH DIFFERENTIAL/PLATELET
Abs Immature Granulocytes: 0.02 10*3/uL (ref 0.00–0.07)
Basophils Absolute: 0 10*3/uL (ref 0.0–0.1)
Basophils Relative: 0 %
Eosinophils Absolute: 0.1 10*3/uL (ref 0.0–0.5)
Eosinophils Relative: 2 %
HCT: 42.3 % (ref 39.0–52.0)
Hemoglobin: 14 g/dL (ref 13.0–17.0)
Immature Granulocytes: 0 %
Lymphocytes Relative: 24 %
Lymphs Abs: 1.3 10*3/uL (ref 0.7–4.0)
MCH: 30.8 pg (ref 26.0–34.0)
MCHC: 33.1 g/dL (ref 30.0–36.0)
MCV: 93 fL (ref 80.0–100.0)
Monocytes Absolute: 0.7 10*3/uL (ref 0.1–1.0)
Monocytes Relative: 13 %
Neutro Abs: 3.2 10*3/uL (ref 1.7–7.7)
Neutrophils Relative %: 61 %
Platelets: 109 10*3/uL — ABNORMAL LOW (ref 150–400)
RBC: 4.55 MIL/uL (ref 4.22–5.81)
RDW: 14 % (ref 11.5–15.5)
WBC: 5.3 10*3/uL (ref 4.0–10.5)
nRBC: 0 % (ref 0.0–0.2)

## 2019-02-16 LAB — COMPREHENSIVE METABOLIC PANEL
ALT: 12 U/L (ref 0–44)
AST: 13 U/L — ABNORMAL LOW (ref 15–41)
Albumin: 3.3 g/dL — ABNORMAL LOW (ref 3.5–5.0)
Alkaline Phosphatase: 17 U/L — ABNORMAL LOW (ref 38–126)
Anion gap: 7 (ref 5–15)
BUN: 17 mg/dL (ref 8–23)
CO2: 28 mmol/L (ref 22–32)
Calcium: 8.7 mg/dL — ABNORMAL LOW (ref 8.9–10.3)
Chloride: 105 mmol/L (ref 98–111)
Creatinine, Ser: 0.8 mg/dL (ref 0.61–1.24)
GFR calc Af Amer: >60 mL/min (ref >60)
GFR calc non Af Amer: >60 mL/min (ref >60)
Glucose, Bld: 98 mg/dL (ref 70–99)
Potassium: 4.1 mmol/L (ref 3.5–5.1)
Sodium: 140 mmol/L (ref 135–145)
Total Bilirubin: 0.8 mg/dL (ref 0.3–1.2)
Total Protein: 5.1 g/dL — ABNORMAL LOW (ref 6.5–8.1)

## 2019-02-16 LAB — LACTATE DEHYDROGENASE: LDH: 160 U/L (ref 98–192)

## 2019-02-16 MED ORDER — PROCHLORPERAZINE MALEATE 10 MG PO TABS
10.0000 mg | ORAL_TABLET | Freq: Once | ORAL | Status: AC
  Administered 2019-02-16: 10 mg via ORAL

## 2019-02-16 MED ORDER — DEXAMETHASONE 4 MG PO TABS
20.0000 mg | ORAL_TABLET | Freq: Once | ORAL | Status: AC
  Administered 2019-02-16: 10:00:00 20 mg via ORAL

## 2019-02-16 MED ORDER — ACETAMINOPHEN 325 MG PO TABS
ORAL_TABLET | ORAL | Status: AC
  Filled 2019-02-16: qty 2

## 2019-02-16 MED ORDER — SODIUM CHLORIDE 0.9% FLUSH
10.0000 mL | INTRAVENOUS | Status: DC | PRN
  Administered 2019-02-16: 12:00:00 10 mL

## 2019-02-16 MED ORDER — DIPHENHYDRAMINE HCL 25 MG PO CAPS
50.0000 mg | ORAL_CAPSULE | Freq: Once | ORAL | Status: AC
  Administered 2019-02-16: 10:00:00 50 mg via ORAL

## 2019-02-16 MED ORDER — DARATUMUMAB-HYALURONIDASE-FIHJ 1800-30000 MG-UT/15ML ~~LOC~~ SOLN
1800.0000 mg | Freq: Once | SUBCUTANEOUS | Status: AC
  Administered 2019-02-16: 11:00:00 1800 mg via SUBCUTANEOUS
  Filled 2019-02-16: qty 15

## 2019-02-16 MED ORDER — AZITHROMYCIN 250 MG PO TABS: ORAL_TABLET | ORAL | 0 refills | Status: DC

## 2019-02-16 MED ORDER — ACETAMINOPHEN 325 MG PO TABS
650.0000 mg | ORAL_TABLET | Freq: Once | ORAL | Status: AC
  Administered 2019-02-16: 650 mg via ORAL

## 2019-02-16 MED ORDER — BELSOMRA 10 MG PO TABS: 10.0000 mg | ORAL_TABLET | Freq: Every evening | ORAL | 3 refills | Status: DC | PRN

## 2019-02-16 MED ORDER — SODIUM CHLORIDE 0.9 % IV SOLN: Freq: Once | INTRAVENOUS | Status: DC

## 2019-02-16 MED ORDER — DEXAMETHASONE 4 MG PO TABS
ORAL_TABLET | ORAL | Status: AC
  Filled 2019-02-16: qty 5

## 2019-02-16 MED ORDER — HEPARIN SOD (PORK) LOCK FLUSH 100 UNIT/ML IV SOLN
500.0000 [IU] | Freq: Once | INTRAVENOUS | Status: AC | PRN
  Administered 2019-02-16: 12:00:00 500 [IU]

## 2019-02-16 MED ORDER — MONTELUKAST SODIUM 10 MG PO TABS: 10.0000 mg | ORAL_TABLET | Freq: Every day | ORAL | 3 refills | Status: DC

## 2019-02-16 MED ORDER — PROCHLORPERAZINE MALEATE 10 MG PO TABS
ORAL_TABLET | ORAL | Status: AC
  Filled 2019-02-16: qty 1

## 2019-02-16 MED ORDER — DIPHENHYDRAMINE HCL 25 MG PO CAPS
ORAL_CAPSULE | ORAL | Status: AC
  Filled 2019-02-16: qty 2

## 2019-02-16 NOTE — Progress Notes (Signed)
[place], [place] [address]   CLINIC:  Medical Oncology/Hematology  PCP:  [name], MD [address] [place] [address] [phone]   REASON FOR VISIT:  Follow-up for MM   CURRENT THERAPY: Dara  BRIEF ONCOLOGIC HISTORY:  Oncology History  Multiple myeloma without remission ([place])  11/16/2012 Initial Diagnosis   L occipital condyle destructive lesion, not biopsied secondary to location. XRT   12/01/2012 Imaging   L3 superior endplate compression FX, no lytic lesions to suggest myeloma   02/06/2014 Imaging   soft tissue mass along the right posterior fifth rib with some rib destruction   02/16/2014 Miscellaneous   Normal CBC, Normal CMP, kappa,lamda ratio of 2.62 (abnormal), UIEP with slightly restricted band, IgG kappa, SPEP/IEP with monoclonal protein at 0.79 g/dl, normal igG, suppressed IGM   02/20/2014 Bone Marrow Biopsy   33% kappa restricted plasma cells Normal FISH, normal cytogenetics   02/21/2014 - 03/08/2014 Radiation Therapy   30Gy to R rib lesion, lesion not biopsied   03/16/2014 PET scan   Scattered hypermetabolic osseous lesions, including the calvarium, ribs, left scapula, sacrum, and left femoral shaft. An expansile left vertebral body lesion at L3 extends into the epidural space of the left lateral recess,    03/17/2014 - 04/07/2014 Chemotherapy   Velcade and Dexamethasone due to initial denial of Revlimid by insurance company.  Zometa monthly.   03/22/2014 Imaging   MR_ L-Spine- Enhancing lesions compatible with multiple myeloma at L2, L3, and S1-2.   04/07/2014 - 07/28/2014 Chemotherapy   RVD with Revlimid at 25 mg days 1-14.  Revlimid dose reduced to 25 mg every other day x 14 days with 7 day respite beginning on day 1 of cycle 2.   04/17/2014 Adverse Reaction   Revlimid-induced rash.  Treated with steroids.   07/28/2014 - 08/04/2014 Chemotherapy   Revlimid daily   08/04/2014 Adverse Reaction   Velcade-induced peripheral neuropathy   08/04/2014 Treatment Plan Change   D/C Velcade   08/11/2014 PET scan   Response to therapy. Improvement and resolution of foci of osseous hypermetabolism.   08/22/2014 - 08/28/2014 Chemotherapy   Revlimid 25 mg days 1-21 every 28 days   08/28/2014 Adverse Reaction   Revlimid-induced rash. Medication held.  Medrol dose Pak prescribed.   09/04/2014 - 01/08/2015 Chemotherapy   Revlimid 10 mg every other day, without dexamethasone   01/08/2015 - 01/15/2015 Hospital Admission   Colonic diverticular abscess with IR drain placement   03/02/2015 PET scan   Only bony uptake is low activity at site of deformity/callus at R second rib FX, high activity in sigmoid colon, advanced sigmoid divertidulosis. Abscess not resolved?   06/04/2015 Imaging   CT nonobstructive L renal calculus, diverticulosis of descending and sigmoid colon without inflammation, moderate prostatic enlargement   07/12/2015 Surgery   Diverticulitis s/p robotic sigmoid colectomy with Dr. Johney Maine   08/23/2015 PET scan   Primarily similar hypermetabolic osseous foci within the R sided ribs. new L third rib focus of hypermetab and sclerosis is favored to be related to healing FX. no soft tissue myeloma id'd. Presumed postop hypermetab and edema about the R pelvic wall    03/06/2016 PET scan   1. Reduced activity in the prior lesion such as the right second and fifth rib lesions, activity nearly completely resolved and significantly less than added mediastinal blood pool activity. No new lesions are identified. 2. Other imaging findings of potential clinical significance: Coronary, aortic  arch, and branch vessel atherosclerotic vascular disease. Aortoiliac atherosclerotic vascular disease. Enlarged prostate gland. Colonic diverticula.   02/17/2017 PET scan   HEAD/NECK: No hypermetabolic activity in the scalp. No hypermetabolic cervical lymph nodes. CHEST: No hypermetabolic mediastinal or hilar nodes.  Right upper lobe scarring/atelectasis. No suspicious pulmonary nodules on the CT scan. ABDOMEN/PELVIS: No abnormal hypermetabolic activity within the liver, pancreas, adrenal glands, or spleen. No hypermetabolic lymph nodes in the abdomen or pelvis. Atherosclerotic calcifications of the abdominal aorta and branch vessels. Left renal sinus cysts. Sigmoid diverticulosis, without evidence of diverticulitis. Prostatomegaly. SKELETON: Vague hypermetabolism involving the inferior sternum, max SUV 3.2, previously 8.2. Focal hypermetabolism involving the left sacrum, max SUV 4.0, previously 6.7. EXTREMITIES: No abnormal hypermetabolic activity in the lower extremities.   02/18/2017 -  Chemotherapy   Bortezomib 1.66m/m2 QWk + Dexamethasone 112mQTue/Fri --Cycle #1, 02/18/17    02/18/2017 - 08/26/2017 Chemotherapy   The patient had bortezomib SQ (VELCADE) chemo injection 3 mg, 1.3 mg/m2 = 3 mg, Subcutaneous,  Once, 7 of 9 cycles Administration: 3 mg (02/18/2017), 3 mg (02/26/2017), 3 mg (03/04/2017), 3 mg (03/11/2017), 3 mg (04/08/2017), 3 mg (03/18/2017), 3 mg (03/25/2017), 3 mg (04/01/2017), 3 mg (05/06/2017), 3 mg (05/20/2017), 3 mg (06/03/2017), 3 mg (06/17/2017), 3 mg (07/01/2017), 3 mg (07/15/2017), 3 mg (07/29/2017), 3 mg (08/12/2017), 3 mg (08/26/2017)  for chemotherapy treatment.    09/17/2017 -  Chemotherapy   The patient had daratumumab-hyaluronidase-fihj (DARZALEX FASPRO) 1800-30000 MG-UT/15ML chemo SQ injection 1,800 mg, 1,800 mg, Subcutaneous,  Once, 5 of 6 cycles Administration: 1,800 mg (10/25/2018), 1,800 mg (11/22/2018), 1,800 mg (12/20/2018), 1,800 mg (01/17/2019), 1,800 mg (02/16/2019) daratumumab (DARZALEX) 1,800 mg in sodium chloride 0.9 % 910 mL (1.8 mg/mL) chemo infusion, 15.9 mg/kg = 1,820 mg, Intravenous, Once, 1 of 1 cycle Administration: 1,800 mg (09/17/2017) daratumumab (DARZALEX) 1,800 mg in sodium chloride 0.9 % 410 mL (3.6 mg/mL) chemo infusion, 1,820 mg, Intravenous, Once, 13 of 13  cycles Administration: 1,800 mg (10/28/2017), 1,800 mg (11/04/2017), 1,800 mg (11/11/2017), 1,800 mg (11/18/2017), 1,800 mg (11/25/2017), 1,800 mg (12/02/2017), 1,800 mg (12/09/2017), 1,800 mg (12/16/2017), 1,800 mg (12/30/2017), 1,800 mg (04/26/2018), 1,800 mg (01/13/2018), 1,800 mg (02/12/2018), 1,800 mg (02/26/2018), 1,800 mg (03/15/2018), 1,800 mg (03/29/2018), 1,800 mg (04/12/2018), 1,800 mg (05/10/2018), 1,800 mg (06/07/2018), 1,800 mg (07/05/2018), 1,800 mg (08/02/2018), 1,800 mg (08/30/2018), 1,800 mg (09/27/2018)  for chemotherapy treatment.         INTERVAL HISTORY:  Mr. He[age]ulsen89.o. male presents today for follow-up.  He reports overall doing well.  He continues with reports of insomnia.  He states the trazodone is not working for him.  He titrated trazodone up to 300 mg.  He continues on daratumumab injections, tolerating well.  He continues with reports of social stress.  His wife has dementia and he is her sole caregiver.  He has talked to our soEducation officer, museumbout his options.  He is here for repeat labs and treatment.  REVIEW OF SYSTEMS:  Review of Systems  Constitutional: Positive for fatigue.  HENT:  Negative.   Eyes: Negative.   Respiratory: Negative.   Cardiovascular: Negative.   Gastrointestinal: Positive for constipation.  Genitourinary: Negative.    Musculoskeletal: Positive for arthralgias, back pain and gait problem.  Skin: Negative.   Neurological: Positive for extremity weakness and gait problem.  Hematological: Negative.   Psychiatric/Behavioral: Positive for depression. The patient is nervous/anxious.      PAST MEDICAL/SURGICAL HISTORY:  Past Medical History:  Diagnosis Date  . BPH (benign prostatic  hyperplasia)   . Colonic diverticular abscess 01/08/2015  . Diverticulitis 67/89/38   complicated by abscess and required percutaneous drainage  . GERD (gastroesophageal reflux disease)   . H/O ETOH abuse   . History of chemotherapy last done jan 2017  . History of radiation  therapy 01/05/13-02/10/13   45 gray to left occipital condyle region  . History of radiation therapy 12/01/16-12/10/16   Parasternal nodule, chest- 24 Gy total delivered in 8 fractions, Left sacro-iliac, pelvis- 24 Gy total delivered in 8 fractions   . History of radiation therapy 02/19/17-02/22/17   right temporal scalp 30 Gy in 10 fractions  . Intra-abdominal abscess ([place])   . Multiple myeloma ([place]) 2015  . Neuropathy   . Radiation 02/21/14-03/08/14   right posterior chest wall area 30 gray  . Radiation 04/19/14-05/02/14   lumbar spine 25 gray  . Skull lesion    Left occipital condyle  . Wrist fracture, left    x 2   Past Surgical History:  Procedure Laterality Date  . COLONOSCOPY N/A 04/19/2015   Procedure: COLONOSCOPY;  Surgeon: Danie Binder, MD;  Location: [facility];  Service: Endoscopy;  Laterality: N/A;  1:30 PM  . CYST EXCISION  1959   tail bone  . IR IMAGING GUIDED PORT INSERTION  09/25/2017     SOCIAL HISTORY:  Social History   Socioeconomic History  . Marital status: Married    Spouse name: Not on file  . Number of children: 3  . Years of education: Not on file  . Highest education level: Not on file  Occupational History    Employer: RETIRED  Tobacco Use  . Smoking status: Never Smoker  . Smokeless tobacco: Never Used  Substance and Sexual Activity  . Alcohol use: No    Comment: " Not much no more"  . Drug use: No  . Sexual activity: Not on file  Other Topics Concern  . Not on file  Social History Narrative  . Not on file   Social Determinants of Health   Financial Resource Strain:   . Difficulty of Paying Living Expenses: Not on file  Food Insecurity:   . Worried About Charity fundraiser in the Last Year: Not on file  . Ran Out of Food in the Last Year: Not on file  Transportation Needs:   . Lack of Transportation (Medical): Not on file  . Lack of Transportation (Non-Medical): Not on file  Physical Activity:   . Days of Exercise per Week: Not  on file  . Minutes of Exercise per Session: Not on file  Stress:   . Feeling of Stress : Not on file  Social Connections:   . Frequency of Communication with Friends and Family: Not on file  . Frequency of Social Gatherings with Friends and Family: Not on file  . Attends Religious Services: Not on file  . Active Member of Clubs or Organizations: Not on file  . Attends Archivist Meetings: Not on file  . Marital Status: Not on file  Intimate Partner Violence:   . Fear of Current or Ex-Partner: Not on file  . Emotionally Abused: Not on file  . Physically Abused: Not on file  . Sexually Abused: Not on file    FAMILY HISTORY:  Family History  Problem Relation Age of Onset  . Diabetes Mother   . Stroke Mother   . Hypertension Father     CURRENT MEDICATIONS:  Outpatient Encounter Medications as of 02/16/2019  Medication  Sig Note  . acyclovir (ZOVIRAX) 400 MG tablet TAKE 1 TABLET BY MOUTH 2 TIMES DAILY   . amoxicillin-clavulanate (AUGMENTIN) 875-125 MG tablet Take 1 tablet by mouth 2 (two) times daily.   Marland Kitchen CALCIUM-VITAMIN D PO Take 1 tablet by mouth 2 (two) times daily.    Marland Kitchen dexamethasone (DECADRON) 4 MG tablet Take 2.5 tablets (10 mg total) by mouth once a week. Take 10 mg once weekly 09/18/2017: Started on 09/18/17  . gabapentin (NEURONTIN) 400 MG capsule TAKE 1 CAPSULE BY MOUTH 3 TIMES DAILY   . MAGNESIUM PO Take 1 tablet by mouth at bedtime.   Marland Kitchen omeprazole (PRILOSEC) 20 MG capsule TAKE 1 CAPSULE BY MOUTH ONCE DAILY   . predniSONE (DELTASONE) 20 MG tablet 3 tabs poqday 1-2, 2 tabs poqday 3-4, 1 tab poqday 5-6   . senna-docusate (SENOKOT-S) 8.6-50 MG tablet Take 1 tablet by mouth 2 (two) times daily.   . traZODone (DESYREL) 100 MG tablet Take 1 tablet (100 mg total) by mouth at bedtime. May titrate up to 300 mg nightly.   . zolendronic acid (ZOMETA) 4 MG/5ML injection Inject 4 mg into the vein every 30 (thirty) days.    . ALPRAZolam (XANAX) 1 MG tablet TAKE 2 TABLETS BY  MOUTH AT BEDTIME AS NEEDED FOR ANXIETY OR SLEEP (Patient not taking: Reported on 02/16/2019)   . diclofenac Sodium (VOLTAREN) 1 % GEL Apply topically as needed. Pt has not picked up yet   . furosemide (LASIX) 20 MG tablet TAKE 1 TABLET BY MOUTH DAILY AS NEEDED FOR EDEMA. (Patient not taking: Reported on 11/30/2018)   . lidocaine-prilocaine (EMLA) cream Apply small amount over port one (1) hour prior to appointment. (Patient not taking: Reported on 11/30/2018)   . Oxycodone HCl 10 MG TABS TAKE 1 TABLET BY MOUTH EVERY 8 HOURS AS NEEDED FOR PAIN (Patient not taking: Reported on 02/16/2019)   . tamsulosin (FLOMAX) 0.4 MG CAPS capsule TAKE ONE CAPSULE BY MOUTH EVERY NIGHT ATBEDTIME (Patient not taking: Reported on 02/16/2019)   . tiZANidine (ZANAFLEX) 4 MG tablet Take 1 tablet (4 mg total) by mouth every 6 (six) hours as needed (headache). (Patient not taking: Reported on 11/30/2018)    No facility-administered encounter medications on file as of 02/16/2019.    ALLERGIES:  Allergies  Allergen Reactions  . Codeine Other (See Comments)    Headache  . Morphine And Related Other (See Comments)    Makes him feel weird  . Revlimid [Lenalidomide] Other (See Comments)    "Causes me to become weak"     PHYSICAL EXAM:  ECOG Performance status: 1  Vitals:   02/16/19 0841  BP: 122/61  Pulse: 72  Resp: 18  Temp: (!) 97 F (36.1 C)  SpO2: 97%   Filed Weights   02/16/19 0841  Weight: 218 lb 9.6 oz (99.2 kg)    Physical Exam Constitutional:      Appearance: Normal appearance.  HENT:     Head: Normocephalic.     Right Ear: External ear normal.     Left Ear: External ear normal.     Nose: Nose normal.  Eyes:     Conjunctiva/sclera: Conjunctivae normal.  Cardiovascular:     Rate and Rhythm: Normal rate and regular rhythm.     Pulses: Normal pulses.     Heart sounds: Normal heart sounds.  Pulmonary:     Effort: Pulmonary effort is normal.     Breath sounds: Normal breath sounds.    Abdominal:  General: Bowel sounds are normal.     Palpations: Abdomen is soft.  Musculoskeletal:     Cervical back: Normal range of motion.     Comments: Decreased range of motion  Skin:    General: Skin is warm.  Neurological:     General: No focal deficit present.     Mental Status: He is alert and oriented to person, place, and time.  Psychiatric:        Mood and Affect: Mood normal.        Behavior: Behavior normal.      LABORATORY DATA:  I have reviewed the labs as listed.  CBC    Component Value Date/Time   WBC 5.3 02/16/2019 0842   RBC 4.55 02/16/2019 0842   HGB 14.0 02/16/2019 0842   HCT 42.3 02/16/2019 0842   PLT 109 (L) 02/16/2019 0842   MCV 93.0 02/16/2019 0842   MCH 30.8 02/16/2019 0842   MCHC 33.1 02/16/2019 0842   RDW 14.0 02/16/2019 0842   LYMPHSABS 1.3 02/16/2019 0842   MONOABS 0.7 02/16/2019 0842   EOSABS 0.1 02/16/2019 0842   BASOSABS 0.0 02/16/2019 0842   CMP Latest Ref Rng & Units 02/16/2019 01/17/2019 12/20/2018  Glucose 70 - 99 mg/dL 98 95 121(H)  BUN 8 - 23 mg/dL 17 18 22   Creatinine 0.61 - 1.24 mg/dL 0.80 0.83 0.77  Sodium 135 - 145 mmol/L 140 142 140  Potassium 3.5 - 5.1 mmol/L 4.1 4.1 3.8  Chloride 98 - 111 mmol/L 105 106 105  CO2 22 - 32 mmol/L 28 27 25   Calcium 8.9 - 10.3 mg/dL 8.7(L) 8.8(L) 8.9  Total Protein 6.5 - 8.1 g/dL 5.1(L) 5.5(L) 5.7(L)  Total Bilirubin 0.3 - 1.2 mg/dL 0.8 0.5 0.9  Alkaline Phos 38 - 126 U/L 17(L) 19(L) 19(L)  AST 15 - 41 U/L 13(L) 15 16  ALT 0 - 44 U/L 12 13 13           ASSESSMENT & PLAN:   Multiple myeloma without remission ([facility]) 1.  Recurrent IgG kappa multiple myeloma: - RVD from 04/07/2014 through 07/28/2014 followed by maintenance Revlimid, which was discontinued. - MRI of the brain on 02/09/2017 showed new right temporal lesion, status post radiation therapy, PET CT in January 2019 showed improvement of multiple myeloma with no new lesions, started on weekly Velcade and dexamethasone on  02/18/2017, status post 2 cycles, Velcade changed every other week on 05/06/2017 secondary to neuropathy. - MRI of the thoracic and lumbar spine on 09/03/2017 showed new rib lesion close to the spine on the left side, possibly eighth rib.  There was soft tissue mass in that area.  There is chronic L4 compression fracture which is stable. -Status post XRT to the left eighth rib lesion, 30 Gy in 10 fractions from 09/29/2017 through 10/14/2017. - DPD regimen started on 09/17/2017, developed erythematous maculopapular rash involving the trunk after taking 3 pills of pomalidomide. - Daratumumab restarted after radiation on 10/28/2017, pomalidomide completely discontinued on 02/12/2018 for recurrent rash. -He was started on SubQ Dara on 10/25/18.  He takes dexamethasone 10 mg weekly. -Skeletal survey on 11/30/2018 showed new expansion lesion at right first rib and left fifth rib. -We reviewed myeloma panel from 12/20/2018.  SPEP is stable at 0.1 g.  Free light chain ratio is 2.65. -Skeletal survey was compared to scans in 2019.  As his myeloma panel was stable, no change in therapy was recommended.  -MM labs are stable. He will proceed with the daratumumab today.  We will reevaluate him in 4 weeks.   2.  Peripheral neuropathy: -He is continuing gabapentin 3 times a day.  3.  Bone protection: -He will continue Zometa every 8 weeks.  4.  Low back pain: -He will continue oxycodone 10 mg 3 times a day.  Pain is well controlled today.  5.  Difficulty falling asleep: -Ambien did not work.   -Trazodone did not work. -Have started patient on Belsomra 10 mg at bedtime.  Patient can titrate up to 20 mg.  6.  Constipation secondary to pain medication -Senokot twice daily.      [place] [phone]

## 2019-02-16 NOTE — Assessment & Plan Note (Signed)
1.  Recurrent IgG kappa multiple myeloma: - RVD from 04/07/2014 through 07/28/2014 followed by maintenance Revlimid, which was discontinued. - MRI of the brain on 02/09/2017 showed new right temporal lesion, status post radiation therapy, PET CT in January 2019 showed improvement of multiple myeloma with no new lesions, started on weekly Velcade and dexamethasone on 02/18/2017, status post 2 cycles, Velcade changed every other week on 05/06/2017 secondary to neuropathy. - MRI of the thoracic and lumbar spine on 09/03/2017 showed new rib lesion close to the spine on the left side, possibly eighth rib.  There was soft tissue mass in that area.  There is chronic L4 compression fracture which is stable. -Status post XRT to the left eighth rib lesion, 30 Gy in 10 fractions from 09/29/2017 through 10/14/2017. - DPD regimen started on 09/17/2017, developed erythematous maculopapular rash involving the trunk after taking 3 pills of pomalidomide. - Daratumumab restarted after radiation on 10/28/2017, pomalidomide completely discontinued on 02/12/2018 for recurrent rash. -He was started on SubQ Dara on 10/25/18.  He takes dexamethasone 10 mg weekly. -Skeletal survey on 11/30/2018 showed new expansion lesion at right first rib and left fifth rib. -We reviewed myeloma panel from 12/20/2018.  SPEP is stable at 0.1 g.  Free light chain ratio is 2.65. -Skeletal survey was compared to scans in 2019.  As his myeloma panel was stable, no change in therapy was recommended.  -MM labs are stable. He will proceed with the daratumumab today.  We will reevaluate him in 4 weeks.   2.  Peripheral neuropathy: -He is continuing gabapentin 3 times a day.  3.  Bone protection: -He will continue Zometa every 8 weeks.  4.  Low back pain: -He will continue oxycodone 10 mg 3 times a day.  Pain is well controlled today.  5.  Difficulty falling asleep: -Ambien did not work.   -Trazodone did not work. -Have started patient on Belsomra 10 mg  at bedtime.  Patient can titrate up to 20 mg.  6.  Constipation secondary to pain medication -Senokot twice daily.

## 2019-02-16 NOTE — Patient Instructions (Signed)
[facility] Discharge Instructions for Patients Receiving Chemotherapy  Today you received the following chemotherapy agents   To help prevent nausea and vomiting after your treatment, we encourage you to take your nausea medication   If you develop nausea and vomiting that is not controlled by your nausea medication, call the clinic.   BELOW ARE SYMPTOMS THAT SHOULD BE REPORTED IMMEDIATELY:  *FEVER GREATER THAN 100.5 F  *CHILLS WITH OR WITHOUT FEVER  NAUSEA AND VOMITING THAT IS NOT CONTROLLED WITH YOUR NAUSEA MEDICATION  *UNUSUAL SHORTNESS OF BREATH  *UNUSUAL BRUISING OR BLEEDING  TENDERNESS IN MOUTH AND THROAT WITH OR WITHOUT PRESENCE OF ULCERS  *URINARY PROBLEMS  *BOWEL PROBLEMS  UNUSUAL RASH Items with * indicate a potential emergency and should be followed up as soon as possible.  Feel free to call the clinic should you have any questions or concerns. The clinic phone number is [phone].  Please show the CHEMO ALERT CARD at check-in to the Emergency Department and triage nurse.   

## 2019-02-16 NOTE — Progress Notes (Signed)
Patient presents today for treatment and follow up visit with RNester NP. Labs reviewed. Message received from NP to proceed with treatment today. Vital signs within parameters for tx. Labs within parameters for treatment.   Treatment given today per MD orders. Tolerated without adverse affects. Vital signs stable. No complaints at this time. Discharged from clinic ambulatory. F/U with [facility] as scheduled.

## 2019-02-17 LAB — PROTEIN ELECTROPHORESIS, SERUM
A/G Ratio: 2.1 — ABNORMAL HIGH (ref 0.7–1.7)
Albumin ELP: 3.2 g/dL (ref 2.9–4.4)
Alpha-1-Globulin: 0.2 g/dL (ref 0.0–0.4)
Alpha-2-Globulin: 0.5 g/dL (ref 0.4–1.0)
Beta Globulin: 0.7 g/dL (ref 0.7–1.3)
Gamma Globulin: 0.1 g/dL — ABNORMAL LOW (ref 0.4–1.8)
Globulin, Total: 1.5 g/dL — ABNORMAL LOW (ref 2.2–3.9)
M-Spike, %: 0.1 g/dL — ABNORMAL HIGH
Total Protein ELP: 4.7 g/dL — ABNORMAL LOW (ref 6.0–8.5)

## 2019-02-17 LAB — KAPPA/LAMBDA LIGHT CHAINS
Kappa free light chain: 9.1 mg/L (ref 3.3–19.4)
Kappa, lambda light chain ratio: 3.03 — ABNORMAL HIGH (ref 0.26–1.65)
Lambda free light chains: 3 mg/L — ABNORMAL LOW (ref 5.7–26.3)

## 2019-03-16 ENCOUNTER — Other Ambulatory Visit: Payer: Self-pay

## 2019-03-16 ENCOUNTER — Inpatient Hospital Stay (HOSPITAL_COMMUNITY): Payer: Medicare Other | Attending: Hematology

## 2019-03-16 ENCOUNTER — Inpatient Hospital Stay (HOSPITAL_COMMUNITY): Payer: Medicare Other

## 2019-03-16 ENCOUNTER — Inpatient Hospital Stay (HOSPITAL_BASED_OUTPATIENT_CLINIC_OR_DEPARTMENT_OTHER): Payer: Medicare Other | Admitting: Hematology

## 2019-03-16 ENCOUNTER — Encounter (HOSPITAL_COMMUNITY): Payer: Self-pay | Admitting: Hematology

## 2019-03-16 VITALS — BP 98/59 | HR 62 | Temp 96.9°F | Resp 18

## 2019-03-16 DIAGNOSIS — Z5112 Encounter for antineoplastic immunotherapy: Secondary | ICD-10-CM | POA: Insufficient documentation

## 2019-03-16 DIAGNOSIS — C9000 Multiple myeloma not having achieved remission: Secondary | ICD-10-CM | POA: Diagnosis not present

## 2019-03-16 LAB — COMPREHENSIVE METABOLIC PANEL
ALT: 13 U/L (ref 0–44)
AST: 16 U/L (ref 15–41)
Albumin: 3.7 g/dL (ref 3.5–5.0)
Alkaline Phosphatase: 19 U/L — ABNORMAL LOW (ref 38–126)
Anion gap: 7 (ref 5–15)
BUN: 19 mg/dL (ref 8–23)
CO2: 26 mmol/L (ref 22–32)
Calcium: 8.8 mg/dL — ABNORMAL LOW (ref 8.9–10.3)
Chloride: 107 mmol/L (ref 98–111)
Creatinine, Ser: 0.9 mg/dL (ref 0.61–1.24)
GFR calc Af Amer: >60 mL/min (ref >60)
GFR calc non Af Amer: >60 mL/min (ref >60)
Glucose, Bld: 93 mg/dL (ref 70–99)
Potassium: 3.8 mmol/L (ref 3.5–5.1)
Sodium: 140 mmol/L (ref 135–145)
Total Bilirubin: 0.6 mg/dL (ref 0.3–1.2)
Total Protein: 5.6 g/dL — ABNORMAL LOW (ref 6.5–8.1)

## 2019-03-16 LAB — CBC WITH DIFFERENTIAL/PLATELET
Abs Immature Granulocytes: 0.01 10*3/uL (ref 0.00–0.07)
Basophils Absolute: 0 10*3/uL (ref 0.0–0.1)
Basophils Relative: 0 %
Eosinophils Absolute: 0 10*3/uL (ref 0.0–0.5)
Eosinophils Relative: 0 %
HCT: 44.9 % (ref 39.0–52.0)
Hemoglobin: 14.7 g/dL (ref 13.0–17.0)
Immature Granulocytes: 0 %
Lymphocytes Relative: 22 %
Lymphs Abs: 1.3 10*3/uL (ref 0.7–4.0)
MCH: 30.4 pg (ref 26.0–34.0)
MCHC: 32.7 g/dL (ref 30.0–36.0)
MCV: 93 fL (ref 80.0–100.0)
Monocytes Absolute: 0.5 10*3/uL (ref 0.1–1.0)
Monocytes Relative: 9 %
Neutro Abs: 3.8 10*3/uL (ref 1.7–7.7)
Neutrophils Relative %: 69 %
Platelets: 121 10*3/uL — ABNORMAL LOW (ref 150–400)
RBC: 4.83 MIL/uL (ref 4.22–5.81)
RDW: 13.6 % (ref 11.5–15.5)
WBC: 5.6 10*3/uL (ref 4.0–10.5)
nRBC: 0 % (ref 0.0–0.2)

## 2019-03-16 LAB — LACTATE DEHYDROGENASE: LDH: 151 U/L (ref 98–192)

## 2019-03-16 MED ORDER — DIPHENHYDRAMINE HCL 25 MG PO CAPS
50.0000 mg | ORAL_CAPSULE | Freq: Once | ORAL | Status: AC
  Administered 2019-03-16: 50 mg via ORAL

## 2019-03-16 MED ORDER — PROCHLORPERAZINE MALEATE 10 MG PO TABS
10.0000 mg | ORAL_TABLET | Freq: Once | ORAL | Status: AC
  Administered 2019-03-16: 10 mg via ORAL

## 2019-03-16 MED ORDER — DIPHENHYDRAMINE HCL 25 MG PO CAPS
ORAL_CAPSULE | ORAL | Status: AC
  Filled 2019-03-16: qty 2

## 2019-03-16 MED ORDER — SODIUM CHLORIDE 0.9% FLUSH
10.0000 mL | INTRAVENOUS | Status: DC | PRN
  Administered 2019-03-16: 10 mL

## 2019-03-16 MED ORDER — DARATUMUMAB-HYALURONIDASE-FIHJ 1800-30000 MG-UT/15ML ~~LOC~~ SOLN
1800.0000 mg | Freq: Once | SUBCUTANEOUS | Status: AC
  Administered 2019-03-16: 1800 mg via SUBCUTANEOUS
  Filled 2019-03-16: qty 15

## 2019-03-16 MED ORDER — PROCHLORPERAZINE MALEATE 10 MG PO TABS
ORAL_TABLET | ORAL | Status: AC
  Filled 2019-03-16: qty 1

## 2019-03-16 MED ORDER — ZOLEDRONIC ACID 4 MG/100ML IV SOLN
4.0000 mg | Freq: Once | INTRAVENOUS | Status: AC
  Administered 2019-03-16: 4 mg via INTRAVENOUS
  Filled 2019-03-16: qty 100

## 2019-03-16 MED ORDER — HEPARIN SOD (PORK) LOCK FLUSH 100 UNIT/ML IV SOLN
500.0000 [IU] | Freq: Once | INTRAVENOUS | Status: AC | PRN
  Administered 2019-03-16: 500 [IU]

## 2019-03-16 MED ORDER — ACETAMINOPHEN 325 MG PO TABS
ORAL_TABLET | ORAL | Status: AC
  Filled 2019-03-16: qty 2

## 2019-03-16 MED ORDER — DEXAMETHASONE 4 MG PO TABS
20.0000 mg | ORAL_TABLET | Freq: Once | ORAL | Status: AC
  Administered 2019-03-16: 20 mg via ORAL

## 2019-03-16 MED ORDER — SODIUM CHLORIDE 0.9 % IV SOLN: Freq: Once | INTRAVENOUS | Status: AC

## 2019-03-16 MED ORDER — DEXAMETHASONE 4 MG PO TABS
ORAL_TABLET | ORAL | Status: AC
  Filled 2019-03-16: qty 5

## 2019-03-16 MED ORDER — ACETAMINOPHEN 325 MG PO TABS
650.0000 mg | ORAL_TABLET | Freq: Once | ORAL | Status: AC
  Administered 2019-03-16: 650 mg via ORAL

## 2019-03-16 NOTE — Patient Instructions (Addendum)
[facility] Discharge Instructions for Patients Receiving Chemotherapy   Beginning January 23rd 2017 lab work for the [facility] will be done in the  Main lab at [facility] on 1st floor. If you have a lab appointment with the [facility] please come in thru the  Main Entrance and check in at the main information desk   Today you received the following chemotherapy agents Darzalex injection as well as Zometa infusion. Follow-up as scheduled. Call clinic for any questions or concerns  To help prevent nausea and vomiting after your treatment, we encourage you to take your nausea medication   If you develop nausea and vomiting, or diarrhea that is not controlled by your medication, call the clinic.  The clinic phone number is [phone]. Office hours are Monday-Friday 8:30am-5:00pm.  BELOW ARE SYMPTOMS THAT SHOULD BE REPORTED IMMEDIATELY:  *FEVER GREATER THAN 101.0 F  *CHILLS WITH OR WITHOUT FEVER  NAUSEA AND VOMITING THAT IS NOT CONTROLLED WITH YOUR NAUSEA MEDICATION  *UNUSUAL SHORTNESS OF BREATH  *UNUSUAL BRUISING OR BLEEDING  TENDERNESS IN MOUTH AND THROAT WITH OR WITHOUT PRESENCE OF ULCERS  *URINARY PROBLEMS  *BOWEL PROBLEMS  UNUSUAL RASH Items with * indicate a potential emergency and should be followed up as soon as possible. If you have an emergency after office hours please contact your primary care physician or go to the nearest emergency department.  Please call the clinic during office hours if you have any questions or concerns.   You may also contact the Patient Navigator at [phone] should you have any questions or need assistance in obtaining follow up care.      Resources For Cancer Patients and their Caregivers ? American Cancer Society: Can assist with transportation, wigs, general needs, runs Look Good Feel Better.        ([phone] ? Cancer Care: Provides financial assistance, online support groups, medication/co-pay  assistance.  1-800-813-HOPE 917-029-8261) ? [facility] Assists [place] cancer patients and their families through emotional , educational and financial support.  939-101-4248 ? [place] DSS Where to apply for food stamps, Medicaid and utility assistance. 520-589-3600 ? [facility]: Transportation to medical appointments. 551 845 6248 ? Social Security Administration: May apply for disability if have a Stage IV cancer. 614-550-4686 2144065284 ? [employer], Disability and Transit Services: Assists with nutrition, care and transit needs. 902-293-6151

## 2019-03-16 NOTE — Progress Notes (Signed)
Dean [name], [place] [address]   CLINIC:  Medical Oncology/Hematology  PCP:  [name], MD [address] [place] [address] [phone]   REASON FOR VISIT:  Follow-up for  IgG kappa multiple myeloma   BRIEF ONCOLOGIC HISTORY:  Oncology History  Multiple myeloma without remission ([place])  11/16/2012 Initial Diagnosis   L occipital condyle destructive lesion, not biopsied secondary to location. XRT   12/01/2012 Imaging   L3 superior endplate compression FX, no lytic lesions to suggest myeloma   02/06/2014 Imaging   soft tissue mass along the right posterior fifth rib with some rib destruction   02/16/2014 Miscellaneous   Normal CBC, Normal CMP, kappa,lamda ratio of 2.62 (abnormal), UIEP with slightly restricted band, IgG kappa, SPEP/IEP with monoclonal protein at 0.79 g/dl, normal igG, suppressed IGM   02/20/2014 Bone Marrow Biopsy   33% kappa restricted plasma cells Normal FISH, normal cytogenetics   02/21/2014 - 03/08/2014 Radiation Therapy   30Gy to R rib lesion, lesion not biopsied   03/16/2014 PET scan   Scattered hypermetabolic osseous lesions, including the calvarium, ribs, left scapula, sacrum, and left femoral shaft. An expansile left vertebral body lesion at L3 extends into the epidural space of the left lateral recess,    03/17/2014 - 04/07/2014 Chemotherapy   Velcade and Dexamethasone due to initial denial of Revlimid by insurance company.  Zometa monthly.   03/22/2014 Imaging   MR_ L-Spine- Enhancing lesions compatible with multiple myeloma at L2, L3, and S1-2.   04/07/2014 - 07/28/2014 Chemotherapy   RVD with Revlimid at 25 mg days 1-14.  Revlimid dose reduced to 25 mg every other day x 14 days with 7 day respite beginning on day 1 of cycle 2.   04/17/2014 Adverse Reaction   Revlimid-induced rash.  Treated with steroids.   07/28/2014 - 08/04/2014 Chemotherapy   Revlimid daily   08/04/2014 Adverse Reaction   Velcade-induced peripheral neuropathy   08/04/2014 Treatment Plan Change   D/C Velcade   08/11/2014 PET scan   Response to therapy. Improvement and resolution of foci of osseous hypermetabolism.   08/22/2014 - 08/28/2014 Chemotherapy   Revlimid 25 mg days 1-21 every 28 days   08/28/2014 Adverse Reaction   Revlimid-induced rash. Medication held.  Medrol dose Pak prescribed.   09/04/2014 - 01/08/2015 Chemotherapy   Revlimid 10 mg every other day, without dexamethasone   01/08/2015 - 01/15/2015 Hospital Admission   Colonic diverticular abscess with IR drain placement   03/02/2015 PET scan   Only bony uptake is low activity at site of deformity/callus at R second rib FX, high activity in sigmoid colon, advanced sigmoid divertidulosis. Abscess not resolved?   06/04/2015 Imaging   CT nonobstructive L renal calculus, diverticulosis of descending and sigmoid colon without inflammation, moderate prostatic enlargement   07/12/2015 Surgery   Diverticulitis s/p robotic sigmoid colectomy with Dr. Johney Maine   08/23/2015 PET scan   Primarily similar hypermetabolic osseous foci within the R sided ribs. new L third rib focus of hypermetab and sclerosis is favored to be related to healing FX. no soft tissue myeloma id'd. Presumed postop hypermetab and edema about the R pelvic wall    03/06/2016 PET scan   1. Reduced activity in the prior lesion such as the right second and fifth rib lesions, activity nearly completely resolved and significantly less than added mediastinal blood pool activity. No new lesions are identified. 2. Other imaging findings of potential clinical significance: Coronary, aortic  arch, and branch vessel atherosclerotic vascular disease. Aortoiliac atherosclerotic vascular disease. Enlarged prostate gland. Colonic diverticula.   02/17/2017 PET scan   HEAD/NECK: No hypermetabolic activity in the scalp. No hypermetabolic cervical lymph nodes. CHEST: No hypermetabolic mediastinal or hilar nodes.  Right upper lobe scarring/atelectasis. No suspicious pulmonary nodules on the CT scan. ABDOMEN/PELVIS: No abnormal hypermetabolic activity within the liver, pancreas, adrenal glands, or spleen. No hypermetabolic lymph nodes in the abdomen or pelvis. Atherosclerotic calcifications of the abdominal aorta and branch vessels. Left renal sinus cysts. Sigmoid diverticulosis, without evidence of diverticulitis. Prostatomegaly. SKELETON: Vague hypermetabolism involving the inferior sternum, max SUV 3.2, previously 8.2. Focal hypermetabolism involving the left sacrum, max SUV 4.0, previously 6.7. EXTREMITIES: No abnormal hypermetabolic activity in the lower extremities.   02/18/2017 -  Chemotherapy   Bortezomib 1.45m/m2 QWk + Dexamethasone 151mQTue/Fri --Cycle #1, 02/18/17    02/18/2017 - 08/26/2017 Chemotherapy   The patient had bortezomib SQ (VELCADE) chemo injection 3 mg, 1.3 mg/m2 = 3 mg, Subcutaneous,  Once, 7 of 9 cycles Administration: 3 mg (02/18/2017), 3 mg (02/26/2017), 3 mg (03/04/2017), 3 mg (03/11/2017), 3 mg (04/08/2017), 3 mg (03/18/2017), 3 mg (03/25/2017), 3 mg (04/01/2017), 3 mg (05/06/2017), 3 mg (05/20/2017), 3 mg (06/03/2017), 3 mg (06/17/2017), 3 mg (07/01/2017), 3 mg (07/15/2017), 3 mg (07/29/2017), 3 mg (08/12/2017), 3 mg (08/26/2017)  for chemotherapy treatment.    09/17/2017 -  Chemotherapy   The patient had daratumumab-hyaluronidase-fihj (DARZALEX FASPRO) 1800-30000 MG-UT/15ML chemo SQ injection 1,800 mg, 1,800 mg, Subcutaneous,  Once, 6 of 9 cycles Administration: 1,800 mg (10/25/2018), 1,800 mg (11/22/2018), 1,800 mg (12/20/2018), 1,800 mg (01/17/2019), 1,800 mg (02/16/2019) daratumumab (DARZALEX) 1,800 mg in sodium chloride 0.9 % 910 mL (1.8 mg/mL) chemo infusion, 15.9 mg/kg = 1,820 mg, Intravenous, Once, 1 of 1 cycle Administration: 1,800 mg (09/17/2017) daratumumab (DARZALEX) 1,800 mg in sodium chloride 0.9 % 410 mL (3.6 mg/mL) chemo infusion, 1,820 mg, Intravenous, Once, 13 of 13  cycles Administration: 1,800 mg (10/28/2017), 1,800 mg (11/04/2017), 1,800 mg (11/11/2017), 1,800 mg (11/18/2017), 1,800 mg (11/25/2017), 1,800 mg (12/02/2017), 1,800 mg (12/09/2017), 1,800 mg (12/16/2017), 1,800 mg (12/30/2017), 1,800 mg (04/26/2018), 1,800 mg (01/13/2018), 1,800 mg (02/12/2018), 1,800 mg (02/26/2018), 1,800 mg (03/15/2018), 1,800 mg (03/29/2018), 1,800 mg (04/12/2018), 1,800 mg (05/10/2018), 1,800 mg (06/07/2018), 1,800 mg (07/05/2018), 1,800 mg (08/02/2018), 1,800 mg (08/30/2018), 1,800 mg (09/27/2018)  for chemotherapy treatment.       CANCER STAGING: Cancer Staging No matching staging information was found for the patient.   INTERVAL HISTORY:  Mr. [age]atross89.o. male seen for follow-up of multiple myeloma.  He is receiving injections of daratumumab monthly.  Appetite and energy levels are low.  He reports mainly low energy for the past few weeks.  Pain in the back is controlled with oxycodone very well.  Denies any fevers or chills.  Numbness in the fingertips and toes have been stable.    REVIEW OF SYSTEMS:  Review of Systems  Constitutional: Positive for fatigue.  Neurological: Positive for numbness.  All other systems reviewed and are negative.    PAST MEDICAL/SURGICAL HISTORY:  Past Medical History:  Diagnosis Date  . BPH (benign prostatic hyperplasia)   . Colonic diverticular abscess 01/08/2015  . Diverticulitis 1165/46/50 complicated by abscess and required percutaneous drainage  . GERD (gastroesophageal reflux disease)   . H/O ETOH abuse   . History of chemotherapy last done jan 2017  . History of radiation therapy 01/05/13-02/10/13   45 gray to left occipital condyle region  .  History of radiation therapy 12/01/16-12/10/16   Parasternal nodule, chest- 24 Gy total delivered in 8 fractions, Left sacro-iliac, pelvis- 24 Gy total delivered in 8 fractions   . History of radiation therapy 02/19/17-02/22/17   right temporal scalp 30 Gy in 10 fractions  . Intra-abdominal  abscess ([place])   . Multiple myeloma ([place]) 2015  . Neuropathy   . Radiation 02/21/14-03/08/14   right posterior chest wall area 30 gray  . Radiation 04/19/14-05/02/14   lumbar spine 25 gray  . Skull lesion    Left occipital condyle  . Wrist fracture, left    x 2   Past Surgical History:  Procedure Laterality Date  . COLONOSCOPY N/A 04/19/2015   Procedure: COLONOSCOPY;  Surgeon: Danie Binder, MD;  Location: [facility];  Service: Endoscopy;  Laterality: N/A;  1:30 PM  . CYST EXCISION  1959   tail bone  . IR IMAGING GUIDED PORT INSERTION  09/25/2017     SOCIAL HISTORY:  Social History   Socioeconomic History  . Marital status: Married    Spouse name: Not on file  . Number of children: 3  . Years of education: Not on file  . Highest education level: Not on file  Occupational History    Employer: RETIRED  Tobacco Use  . Smoking status: Never Smoker  . Smokeless tobacco: Never Used  Substance and Sexual Activity  . Alcohol use: No    Comment: " Not much no more"  . Drug use: No  . Sexual activity: Not on file  Other Topics Concern  . Not on file  Social History Narrative  . Not on file   Social Determinants of Health   Financial Resource Strain:   . Difficulty of Paying Living Expenses: Not on file  Food Insecurity:   . Worried About Charity fundraiser in the Last Year: Not on file  . Ran Out of Food in the Last Year: Not on file  Transportation Needs:   . Lack of Transportation (Medical): Not on file  . Lack of Transportation (Non-Medical): Not on file  Physical Activity:   . Days of Exercise per Week: Not on file  . Minutes of Exercise per Session: Not on file  Stress:   . Feeling of Stress : Not on file  Social Connections:   . Frequency of Communication with Friends and Family: Not on file  . Frequency of Social Gatherings with Friends and Family: Not on file  . Attends Religious Services: Not on file  . Active Member of Clubs or Organizations: Not on file   . Attends Archivist Meetings: Not on file  . Marital Status: Not on file  Intimate Partner Violence:   . Fear of Current or Ex-Partner: Not on file  . Emotionally Abused: Not on file  . Physically Abused: Not on file  . Sexually Abused: Not on file    FAMILY HISTORY:  Family History  Problem Relation Age of Onset  . Diabetes Mother   . Stroke Mother   . Hypertension Father     CURRENT MEDICATIONS:  Outpatient Encounter Medications as of 03/16/2019  Medication Sig Note  . Homeopathic Products ([place]) Apply topically as needed.   . Oxycodone HCl 10 MG TABS TAKE 1 TABLET BY MOUTH EVERY 8 HOURS AS NEEDED FOR PAIN   . acyclovir (ZOVIRAX) 400 MG tablet TAKE 1 TABLET BY MOUTH 2 TIMES DAILY   . ALPRAZolam (XANAX) 1 MG tablet TAKE 2 TABLETS BY  MOUTH AT BEDTIME AS NEEDED FOR ANXIETY OR SLEEP (Patient not taking: Reported on 02/16/2019)   . amoxicillin-clavulanate (AUGMENTIN) 875-125 MG tablet Take 1 tablet by mouth 2 (two) times daily.   Marland Kitchen azithromycin (ZITHROMAX) 250 MG tablet Take 2 tablets (511m) Day 1 and 1 tablet (2519m Day 2-5.   . Marland KitchenALCIUM-VITAMIN D PO Take 1 tablet by mouth 2 (two) times daily.    . Marland Kitchenexamethasone (DECADRON) 4 MG tablet Take 2.5 tablets (10 mg total) by mouth once a week. Take 10 mg once weekly 09/18/2017: Started on 09/18/17  . diclofenac Sodium (VOLTAREN) 1 % GEL Apply topically as needed. Pt has not picked up yet   . furosemide (LASIX) 20 MG tablet TAKE 1 TABLET BY MOUTH DAILY AS NEEDED FOR EDEMA. (Patient not taking: Reported on 11/30/2018)   . gabapentin (NEURONTIN) 400 MG capsule TAKE 1 CAPSULE BY MOUTH 3 TIMES DAILY   . lidocaine-prilocaine (EMLA) cream Apply small amount over port one (1) hour prior to appointment. (Patient not taking: Reported on 11/30/2018)   . MAGNESIUM PO Take 1 tablet by mouth at bedtime.   . montelukast (SINGULAIR) 10 MG tablet Take 1 tablet (10 mg total) by mouth at bedtime.   . Marland Kitchenmeprazole (PRILOSEC) 20 MG capsule  TAKE 1 CAPSULE BY MOUTH ONCE DAILY   . senna-docusate (SENOKOT-S) 8.6-50 MG tablet Take 1 tablet by mouth 2 (two) times daily.   . Suvorexant (BELSOMRA) 10 MG TABS Take 10 mg by mouth at bedtime as needed.   . tamsulosin (FLOMAX) 0.4 MG CAPS capsule TAKE ONE CAPSULE BY MOUTH EVERY NIGHT ATBEDTIME (Patient not taking: Reported on 02/16/2019)   . tiZANidine (ZANAFLEX) 4 MG tablet Take 1 tablet (4 mg total) by mouth every 6 (six) hours as needed (headache). (Patient not taking: Reported on 11/30/2018)   . traZODone (DESYREL) 100 MG tablet Take 1 tablet (100 mg total) by mouth at bedtime. May titrate up to 300 mg nightly.   . zolendronic acid (ZOMETA) 4 MG/5ML injection Inject 4 mg into the vein every 30 (thirty) days.    . [DISCONTINUED] predniSONE (DELTASONE) 20 MG tablet 3 tabs poqday 1-2, 2 tabs poqday 3-4, 1 tab poqday 5-6    No facility-administered encounter medications on file as of 03/16/2019.    ALLERGIES:  Allergies  Allergen Reactions  . Codeine Other (See Comments)    Headache  . Morphine And Related Other (See Comments)    Makes him feel weird  . Revlimid [Lenalidomide] Other (See Comments)    "Causes me to become weak"     PHYSICAL EXAM:  ECOG Performance status: 1  Vitals:   03/16/19 0853  BP: 139/79  Pulse: 99  Resp: 14  Temp: (!) 97.1 F (36.2 C)  SpO2: 96%   Filed Weights   03/16/19 0853  Weight: 215 lb 3.2 oz (97.6 kg)    Physical Exam Vitals reviewed.  Constitutional:      Appearance: Normal appearance.  Cardiovascular:     Rate and Rhythm: Normal rate and regular rhythm.     Heart sounds: Normal heart sounds.  Pulmonary:     Effort: Pulmonary effort is normal.     Breath sounds: Normal breath sounds.  Abdominal:     General: There is no distension.     Palpations: Abdomen is soft. There is no mass.  Musculoskeletal:        General: No swelling.  Skin:    General: Skin is warm.  Neurological:     General: No  focal deficit present.     Mental  Status: He is alert and oriented to person, place, and time.  Psychiatric:        Mood and Affect: Mood normal.        Behavior: Behavior normal.      LABORATORY DATA:  I have reviewed the labs as listed.  CBC    Component Value Date/Time   WBC 5.6 03/16/2019 0904   RBC 4.83 03/16/2019 0904   HGB 14.7 03/16/2019 0904   HCT 44.9 03/16/2019 0904   PLT 121 (L) 03/16/2019 0904   MCV 93.0 03/16/2019 0904   MCH 30.4 03/16/2019 0904   MCHC 32.7 03/16/2019 0904   RDW 13.6 03/16/2019 0904   LYMPHSABS 1.3 03/16/2019 0904   MONOABS 0.5 03/16/2019 0904   EOSABS 0.0 03/16/2019 0904   BASOSABS 0.0 03/16/2019 0904   CMP Latest Ref Rng & Units 03/16/2019 02/16/2019 01/17/2019  Glucose 70 - 99 mg/dL 93 98 95  BUN 8 - 23 mg/dL _0 Creatinine 0.61 - 1.24 mg/dL 0.90 0.80 0.83  Sodium 135 - 145 mmol/L 140 140 142  Potassium 3.5 - 5.1 mmol/L 3.8 4.1 4.1  Chloride 98 - 111 mmol/L 107 105 106  CO2 22 - 32 mmol/L _1 Calcium 8.9 - 10.3 mg/dL 8.8(L) 8.7(L) 8.8(L)  Total Protein 6.5 - 8.1 g/dL 5.6(L) 5.1(L) 5.5(L)  Total Bilirubin 0.3 - 1.2 mg/dL 0.6 0.8 0.5  Alkaline Phos 38 - 126 U/L 19(L) 17(L) 19(L)  AST 15 - 41 U/L 16 13(L) 15  ALT 0 - 44 U/L _2 DIAGNOSTIC IMAGING:  I have independently reviewed the scans and discussed with the patient.   I have reviewed Venita Lick LPN's note and agree with the documentation.  I personally performed a face-to-face visit, made revisions and my assessment and plan is as follows.    ASSESSMENT & PLAN:   Multiple myeloma without remission ([place]) 1.  Recurrent IgG kappa multiple myeloma: -RVD from 04/07/2014 through 07/28/2014 followed by maintenance Revlimid, which was discontinued. -MRI of the brain on 02/09/2017 showed right temporal lesion, status post radiation, PET/CT on January 2019 showed improvement in multiple myeloma with no new lesions. -Velcade changed from every other week on 05/06/2017 secondary to neuropathy. -MRI of  the thoracic and lumbar spine on 09/03/2017 showed new rib lesion close to the spine on the left side, possibly eighth rib.  There was soft tissue mass in that area.  There is chronic L4 compression fracture which is stable. -Status post XRT to the left eighth rib lesion, 30 Gray in 10 fractions from 827 2019-9 12/2017 -DPD regimen started on 09/17/2017, developed erythematous maculopapular rash involving the trunk after taking 3 pills of pomalidomide. -Daratumumab restarted after radiation on 10/28/2017, Pomalyst completely discontinued on 02/12/2018 for recurrent rash. -Subcu daratumumab on 10/25/2018.  He takes dexamethasone 10 mg on days of treatment. -We reviewed myeloma panel from 02/16/2019.  M spike is 0.1 g and stable.  Free light chain ratio is 3.03 and stable.  Kappa light chains are 9.1. -He complains of fatigue.  I have instructed him to be more active and mobile. -I have also reviewed his CBC which showed hemoglobin 14.7.  LFTs are normal.  We will proceed with his next cycle of daratumumab today. -He lost 3 pounds since last visit.  We will keep a close eye on it.  We will reevaluate him in 4 weeks.  2.  Low back pain: -He is taking oxycodone 10 mg 3 times a day.  Pain is well controlled.  3.  Bone protection: -He will continue zoledronic acid every 8 weeks.  Calcium today is normal with normal creatinine.  4.  Difficulty with sleep: -He was given Belsomra at last visit.  He did not help.  He is back to taking trazodone along with Xanax which is helping.  5.  Peripheral neuropathy: -He is continuing gabapentin 3 times a day.  This is helping.      Orders placed this encounter:  No orders of the defined types were placed in this encounter.     Derek Jack, MD [place] [phone]

## 2019-03-16 NOTE — Assessment & Plan Note (Signed)
1.  Recurrent IgG kappa multiple myeloma: -RVD from 04/07/2014 through 07/28/2014 followed by maintenance Revlimid, which was discontinued. -MRI of the brain on 02/09/2017 showed right temporal lesion, status post radiation, PET/CT on January 2019 showed improvement in multiple myeloma with no new lesions. -Velcade changed from every other week on 05/06/2017 secondary to neuropathy. -MRI of the thoracic and lumbar spine on 09/03/2017 showed new rib lesion close to the spine on the left side, possibly eighth rib.  There was soft tissue mass in that area.  There is chronic L4 compression fracture which is stable. -Status post XRT to the left eighth rib lesion, 30 Gray in 10 fractions from 827 2019-9 12/2017 -DPD regimen started on 09/17/2017, developed erythematous maculopapular rash involving the trunk after taking 3 pills of pomalidomide. -Daratumumab restarted after radiation on 10/28/2017, Pomalyst completely discontinued on 02/12/2018 for recurrent rash. -Subcu daratumumab on 10/25/2018.  He takes dexamethasone 10 mg on days of treatment. -We reviewed myeloma panel from 02/16/2019.  M spike is 0.1 g and stable.  Free light chain ratio is 3.03 and stable.  Kappa light chains are 9.1. -He complains of fatigue.  I have instructed him to be more active and mobile. -I have also reviewed his CBC which showed hemoglobin 14.7.  LFTs are normal.  We will proceed with his next cycle of daratumumab today. -He lost 3 pounds since last visit.  We will keep a close eye on it.  We will reevaluate him in 4 weeks.  2.  Low back pain: -He is taking oxycodone 10 mg 3 times a day.  Pain is well controlled.  3.  Bone protection: -He will continue zoledronic acid every 8 weeks.  Calcium today is normal with normal creatinine.  4.  Difficulty with sleep: -He was given Belsomra at last visit.  He did not help.  He is back to taking trazodone along with Xanax which is helping.  5.  Peripheral neuropathy: -He is continuing  gabapentin 3 times a day.  This is helping.

## 2019-03-16 NOTE — Patient Instructions (Addendum)
[place] at [facility] Discharge Instructions  You were seen today by Dr. Delton Coombes. He went over your recent lab results. Try to stay active to help with your tiredness. He will see you back in 4 weeks for labs, treatment and follow up.   Thank you for choosing [place] at [facility] to provide your oncology and hematology care.  To afford each patient quality time with our provider, please arrive at least 15 minutes before your scheduled appointment time.   If you have a lab appointment with the [facility] please come in thru the  Main Entrance and check in at the main information desk  You need to re-schedule your appointment should you arrive 10 or more minutes late.  We strive to give you quality time with our providers, and arriving late affects you and other patients whose appointments are after yours.  Also, if you no show three or more times for appointments you may be dismissed from the clinic at the providers discretion.     Again, thank you for choosing [facility].  Our hope is that these requests will decrease the amount of time that you wait before being seen by our physicians.       _____________________________________________________________  Should you have questions after your visit to [facility], please contact our office at [phone] between the hours of 8:00 a.m. and 4:30 p.m.  Voicemails left after 4:00 p.m. will not be returned until the following business day.  For prescription refill requests, have your pharmacy contact our office and allow 72 hours.    Cancer Center Support Programs:   > Cancer Support Group  2nd Tuesday of the month 1pm-2pm, Journey Room

## 2019-03-16 NOTE — Progress Notes (Signed)
Patient has been assessed, vital signs and labs have been reviewed by Dr. Katragadda. ANC, Creatinine, LFTs, and Platelets are within treatment parameters per Dr. Katragadda. The patient is good to proceed with treatment at this time.  

## 2019-03-16 NOTE — Progress Notes (Signed)
1018 Labs reviewed with and pt seen by Dr. Delton Coombes and pt approved for Darzalex injection and Zometa infusion today per MD                                 [name]. tolerated Darzalex and Zometa well without complaints or incident. Calcium 8.8 today and pt denied any tooth or jaw pain and no recent or future dental visits prior to administering the Xgeva.Pt continues to take his Calcium PO as prescribed without issues.VSS upon discharge. Pt discharged via wheelchair in satisfactory condition

## 2019-03-17 LAB — PROTEIN ELECTROPHORESIS, SERUM
A/G Ratio: 1.8 — ABNORMAL HIGH (ref 0.7–1.7)
Albumin ELP: 3.2 g/dL (ref 2.9–4.4)
Alpha-1-Globulin: 0.2 g/dL (ref 0.0–0.4)
Alpha-2-Globulin: 0.6 g/dL (ref 0.4–1.0)
Beta Globulin: 0.8 g/dL (ref 0.7–1.3)
Gamma Globulin: 0.2 g/dL — ABNORMAL LOW (ref 0.4–1.8)
Globulin, Total: 1.8 g/dL — ABNORMAL LOW (ref 2.2–3.9)
M-Spike, %: 0.1 g/dL — ABNORMAL HIGH
Total Protein ELP: 5 g/dL — ABNORMAL LOW (ref 6.0–8.5)

## 2019-03-25 ENCOUNTER — Telehealth (HOSPITAL_COMMUNITY): Payer: Self-pay

## 2019-03-25 NOTE — Telephone Encounter (Signed)
Nutrition  Patient identified on Malnutrition Screening report for poor appetite and weight loss.  Chart reviewed.   Called patient and no answer or option to leave voice mail due to mailbox being full.    Lee-Anne Flicker B. Zenia Resides, [place], [place] Registered Dietitian [phone] (pager)

## 2019-03-30 DIAGNOSIS — W19XXXA Unspecified fall, initial encounter: Secondary | ICD-10-CM | POA: Diagnosis not present

## 2019-03-30 DIAGNOSIS — R5381 Other malaise: Secondary | ICD-10-CM | POA: Diagnosis not present

## 2019-03-30 DIAGNOSIS — R531 Weakness: Secondary | ICD-10-CM | POA: Diagnosis not present

## 2019-04-04 ENCOUNTER — Other Ambulatory Visit (HOSPITAL_COMMUNITY): Payer: Self-pay

## 2019-04-04 DIAGNOSIS — G4700 Insomnia, unspecified: Secondary | ICD-10-CM

## 2019-04-05 MED ORDER — TRAZODONE HCL 100 MG PO TABS: 100.0000 mg | ORAL_TABLET | Freq: Every day | ORAL | 3 refills | Status: DC

## 2019-04-11 ENCOUNTER — Other Ambulatory Visit (HOSPITAL_COMMUNITY): Payer: Self-pay | Admitting: *Deleted

## 2019-04-11 ENCOUNTER — Encounter (HOSPITAL_COMMUNITY): Payer: Self-pay | Admitting: *Deleted

## 2019-04-11 DIAGNOSIS — C9000 Multiple myeloma not having achieved remission: Secondary | ICD-10-CM

## 2019-04-11 NOTE — Progress Notes (Signed)
Patient's daughter called and reports that patient has been extremely weak, he has fallen multiple times, he is unable to eat or drink much. She reports that his speech is slurred from being so weak. She has called EMS this weekend and Dean [name]'s vital signs were stable so he refused to come to the hospital for evaluation.  I have advised her that we could bring him in for fluids and labs. She is okay with bringing him in tomorrow instead of today.  She is aware of the appointment.

## 2019-04-12 ENCOUNTER — Other Ambulatory Visit: Payer: Self-pay

## 2019-04-12 ENCOUNTER — Inpatient Hospital Stay (HOSPITAL_COMMUNITY): Payer: Medicare Other | Attending: Hematology

## 2019-04-12 ENCOUNTER — Encounter (HOSPITAL_COMMUNITY): Payer: Self-pay

## 2019-04-12 VITALS — BP 112/51 | HR 69 | Temp 96.9°F | Resp 18

## 2019-04-12 DIAGNOSIS — C9000 Multiple myeloma not having achieved remission: Secondary | ICD-10-CM

## 2019-04-12 LAB — CBC WITH DIFFERENTIAL/PLATELET
Abs Immature Granulocytes: 0.02 10*3/uL (ref 0.00–0.07)
Basophils Absolute: 0 10*3/uL (ref 0.0–0.1)
Basophils Relative: 0 %
Eosinophils Absolute: 0 10*3/uL (ref 0.0–0.5)
Eosinophils Relative: 0 %
HCT: 43 % (ref 39.0–52.0)
Hemoglobin: 14.3 g/dL (ref 13.0–17.0)
Immature Granulocytes: 0 %
Lymphocytes Relative: 20 %
Lymphs Abs: 1.1 10*3/uL (ref 0.7–4.0)
MCH: 31 pg (ref 26.0–34.0)
MCHC: 33.3 g/dL (ref 30.0–36.0)
MCV: 93.3 fL (ref 80.0–100.0)
Monocytes Absolute: 0.5 10*3/uL (ref 0.1–1.0)
Monocytes Relative: 8 %
Neutro Abs: 4 10*3/uL (ref 1.7–7.7)
Neutrophils Relative %: 72 %
Platelets: 130 10*3/uL — ABNORMAL LOW (ref 150–400)
RBC: 4.61 MIL/uL (ref 4.22–5.81)
RDW: 14 % (ref 11.5–15.5)
WBC: 5.6 10*3/uL (ref 4.0–10.5)
nRBC: 0 % (ref 0.0–0.2)

## 2019-04-12 LAB — COMPREHENSIVE METABOLIC PANEL
ALT: 14 U/L (ref 0–44)
AST: 17 U/L (ref 15–41)
Albumin: 3.7 g/dL (ref 3.5–5.0)
Alkaline Phosphatase: 19 U/L — ABNORMAL LOW (ref 38–126)
Anion gap: 8 (ref 5–15)
BUN: 24 mg/dL — ABNORMAL HIGH (ref 8–23)
CO2: 25 mmol/L (ref 22–32)
Calcium: 8.5 mg/dL — ABNORMAL LOW (ref 8.9–10.3)
Chloride: 106 mmol/L (ref 98–111)
Creatinine, Ser: 0.8 mg/dL (ref 0.61–1.24)
GFR calc Af Amer: >60 mL/min (ref >60)
GFR calc non Af Amer: >60 mL/min (ref >60)
Glucose, Bld: 101 mg/dL — ABNORMAL HIGH (ref 70–99)
Potassium: 4 mmol/L (ref 3.5–5.1)
Sodium: 139 mmol/L (ref 135–145)
Total Bilirubin: 0.8 mg/dL (ref 0.3–1.2)
Total Protein: 5.6 g/dL — ABNORMAL LOW (ref 6.5–8.1)

## 2019-04-12 LAB — MAGNESIUM: Magnesium: 2.1 mg/dL (ref 1.7–2.4)

## 2019-04-12 MED ORDER — SODIUM CHLORIDE 0.9% FLUSH
10.0000 mL | Freq: Once | INTRAVENOUS | Status: AC | PRN
  Administered 2019-04-12: 10 mL

## 2019-04-12 MED ORDER — HEPARIN SOD (PORK) LOCK FLUSH 100 UNIT/ML IV SOLN
500.0000 [IU] | Freq: Once | INTRAVENOUS | Status: AC | PRN
  Administered 2019-04-12: 500 [IU]

## 2019-04-12 MED ORDER — SODIUM CHLORIDE 0.9 % IV SOLN
Freq: Once | INTRAVENOUS | Status: AC
  Filled 2019-04-12: qty 1000

## 2019-04-12 NOTE — Patient Instructions (Signed)
[facility] at [facility] Discharge Instructions  Received IV hydration with potassium and magnesium today. Follow-up as scheduled. Call clinic for any questions or concerns   Thank you for choosing [facility] at [facility] to provide your oncology and hematology care.  To afford each patient quality time with our provider, please arrive at least 15 minutes before your scheduled appointment time.   If you have a lab appointment with the Cancer Center please come in thru the Main Entrance and check in at the main information desk.  You need to re-schedule your appointment should you arrive 10 or more minutes late.  We strive to give you quality time with our providers, and arriving late affects you and other patients whose appointments are after yours.  Also, if you no show three or more times for appointments you may be dismissed from the clinic at the providers discretion.     Again, thank you for choosing [facility].  Our hope is that these requests will decrease the amount of time that you wait before being seen by our physicians.       _____________________________________________________________  Should you have questions after your visit to [facility], please contact our office at [phone] between the hours of 8:00 a.m. and 4:30 p.m.  Voicemails left after 4:00 p.m. will not be returned until the following business day.  For prescription refill requests, have your pharmacy contact our office and allow 72 hours.    Due to Covid, you will need to wear a mask upon entering the hospital. If you do not have a mask, a mask will be given to you at the Main Entrance upon arrival. For doctor visits, patients may have 1 support person with them. For treatment visits, patients can not have anyone with them due to social distancing guidelines and our immunocompromised population.     

## 2019-04-12 NOTE — Progress Notes (Signed)
[place] reviewed with Dr. Delton Coombes and no new orders obtained. Pt to continue IV hydration with magnesium and potassium as ordered earlier per MD                                              Dean [name]. tolerated IV hydration well without complaints or incident. Spoke with pt's daughter regarding lab results today and next appts.VSS upon discharge. Pt discharged via wheelchair in satisfactory condition

## 2019-04-14 ENCOUNTER — Inpatient Hospital Stay (HOSPITAL_BASED_OUTPATIENT_CLINIC_OR_DEPARTMENT_OTHER): Payer: Medicare Other | Admitting: Hematology

## 2019-04-14 ENCOUNTER — Inpatient Hospital Stay (HOSPITAL_COMMUNITY): Payer: Medicare Other

## 2019-04-14 ENCOUNTER — Encounter (HOSPITAL_COMMUNITY): Payer: Self-pay | Admitting: Hematology

## 2019-04-14 ENCOUNTER — Other Ambulatory Visit: Payer: Self-pay

## 2019-04-14 VITALS — BP 113/55 | HR 56 | Temp 97.7°F | Resp 18

## 2019-04-14 DIAGNOSIS — C9000 Multiple myeloma not having achieved remission: Secondary | ICD-10-CM

## 2019-04-14 LAB — COMPREHENSIVE METABOLIC PANEL
ALT: 13 U/L (ref 0–44)
AST: 17 U/L (ref 15–41)
Albumin: 3.4 g/dL — ABNORMAL LOW (ref 3.5–5.0)
Alkaline Phosphatase: 18 U/L — ABNORMAL LOW (ref 38–126)
Anion gap: 7 (ref 5–15)
BUN: 19 mg/dL (ref 8–23)
CO2: 25 mmol/L (ref 22–32)
Calcium: 8 mg/dL — ABNORMAL LOW (ref 8.9–10.3)
Chloride: 105 mmol/L (ref 98–111)
Creatinine, Ser: 0.76 mg/dL (ref 0.61–1.24)
GFR calc Af Amer: >60 mL/min (ref >60)
GFR calc non Af Amer: >60 mL/min (ref >60)
Glucose, Bld: 89 mg/dL (ref 70–99)
Potassium: 3.7 mmol/L (ref 3.5–5.1)
Sodium: 137 mmol/L (ref 135–145)
Total Bilirubin: 1 mg/dL (ref 0.3–1.2)
Total Protein: 5.2 g/dL — ABNORMAL LOW (ref 6.5–8.1)

## 2019-04-14 LAB — CBC WITH DIFFERENTIAL/PLATELET
Abs Immature Granulocytes: 0.01 10*3/uL (ref 0.00–0.07)
Basophils Absolute: 0 10*3/uL (ref 0.0–0.1)
Basophils Relative: 0 %
Eosinophils Absolute: 0 10*3/uL (ref 0.0–0.5)
Eosinophils Relative: 0 %
HCT: 41.1 % (ref 39.0–52.0)
Hemoglobin: 13.4 g/dL (ref 13.0–17.0)
Immature Granulocytes: 0 %
Lymphocytes Relative: 16 %
Lymphs Abs: 0.8 10*3/uL (ref 0.7–4.0)
MCH: 30.5 pg (ref 26.0–34.0)
MCHC: 32.6 g/dL (ref 30.0–36.0)
MCV: 93.6 fL (ref 80.0–100.0)
Monocytes Absolute: 0.5 10*3/uL (ref 0.1–1.0)
Monocytes Relative: 10 %
Neutro Abs: 3.6 10*3/uL (ref 1.7–7.7)
Neutrophils Relative %: 74 %
Platelets: 115 10*3/uL — ABNORMAL LOW (ref 150–400)
RBC: 4.39 MIL/uL (ref 4.22–5.81)
RDW: 13.8 % (ref 11.5–15.5)
WBC: 5 10*3/uL (ref 4.0–10.5)
nRBC: 0 % (ref 0.0–0.2)

## 2019-04-14 LAB — LACTATE DEHYDROGENASE: LDH: 161 U/L (ref 98–192)

## 2019-04-14 MED ORDER — HEPARIN SOD (PORK) LOCK FLUSH 100 UNIT/ML IV SOLN: 250.0000 [IU] | Freq: Once | INTRAVENOUS | Status: DC | PRN

## 2019-04-14 MED ORDER — SODIUM CHLORIDE 0.9% FLUSH
10.0000 mL | Freq: Once | INTRAVENOUS | Status: AC | PRN
  Administered 2019-04-14: 10 mL

## 2019-04-14 MED ORDER — ALTEPLASE 2 MG IJ SOLR: 2.0000 mg | Freq: Once | INTRAMUSCULAR | Status: DC | PRN

## 2019-04-14 MED ORDER — HEPARIN SOD (PORK) LOCK FLUSH 100 UNIT/ML IV SOLN
500.0000 [IU] | Freq: Once | INTRAVENOUS | Status: AC | PRN
  Administered 2019-04-14: 13:00:00 500 [IU]

## 2019-04-14 MED ORDER — SODIUM CHLORIDE 0.9% FLUSH: 3.0000 mL | Freq: Once | INTRAVENOUS | Status: DC | PRN

## 2019-04-14 MED ORDER — SODIUM CHLORIDE 0.9 % IV SOLN
Freq: Once | INTRAVENOUS | Status: AC
  Filled 2019-04-14: qty 1000

## 2019-04-14 NOTE — Progress Notes (Signed)
Patient presents today for treatment and follow up visit with Dr. Delton Coombes. Labs pending. MAR reviewed. Patient has complaints of fatigue not relieved by rest. Vital signs within parameters for treatment.   Message received from [facility] LPN/ Dr. Delton Coombes. HOLD treatment today. Give IV fluids Sodium chloride with 4mEq of potassium chloride and 2 grams magnesium sulfate over 2 hours.   Hydration given today per MD orders over 2 hours.  Tolerated infusion without adverse affects. Vital signs stable. No complaints at this time. Discharged from clinic via wheel chair.  F/U with [facility] as scheduled.

## 2019-04-14 NOTE — Patient Instructions (Signed)
[facility] Discharge Instructions for Patients Receiving Chemotherapy  Today you received the following chemotherapy agents   To help prevent nausea and vomiting after your treatment, we encourage you to take your nausea medication   If you develop nausea and vomiting that is not controlled by your nausea medication, call the clinic.   BELOW ARE SYMPTOMS THAT SHOULD BE REPORTED IMMEDIATELY:  *FEVER GREATER THAN 100.5 F  *CHILLS WITH OR WITHOUT FEVER  NAUSEA AND VOMITING THAT IS NOT CONTROLLED WITH YOUR NAUSEA MEDICATION  *UNUSUAL SHORTNESS OF BREATH  *UNUSUAL BRUISING OR BLEEDING  TENDERNESS IN MOUTH AND THROAT WITH OR WITHOUT PRESENCE OF ULCERS  *URINARY PROBLEMS  *BOWEL PROBLEMS  UNUSUAL RASH Items with * indicate a potential emergency and should be followed up as soon as possible.  Feel free to call the clinic should you have any questions or concerns. The clinic phone number is [phone].  Please show the CHEMO ALERT CARD at check-in to the Emergency Department and triage nurse.   

## 2019-04-14 NOTE — Progress Notes (Signed)
Patient has been assessed, vital signs and labs have been reviewed by Dr. Delton Coombes. Please HOLD Tx today but give 1 liter fluids with electrolytes over 2 hours today

## 2019-04-14 NOTE — Progress Notes (Signed)
Dean [name], [place] [address]   CLINIC:  Medical Oncology/Hematology  PCP:  [name], MD [address] [place] [address] [phone]   REASON FOR VISIT:  Follow-up for  IgG kappa multiple myeloma   BRIEF ONCOLOGIC HISTORY:  Oncology History  Multiple myeloma without remission ([place])  11/16/2012 Initial Diagnosis   L occipital condyle destructive lesion, not biopsied secondary to location. XRT   12/01/2012 Imaging   L3 superior endplate compression FX, no lytic lesions to suggest myeloma   02/06/2014 Imaging   soft tissue mass along the right posterior fifth rib with some rib destruction   02/16/2014 Miscellaneous   Normal CBC, Normal CMP, kappa,lamda ratio of 2.62 (abnormal), UIEP with slightly restricted band, IgG kappa, SPEP/IEP with monoclonal protein at 0.79 g/dl, normal igG, suppressed IGM   02/20/2014 Bone Marrow Biopsy   33% kappa restricted plasma cells Normal FISH, normal cytogenetics   02/21/2014 - 03/08/2014 Radiation Therapy   30Gy to R rib lesion, lesion not biopsied   03/16/2014 PET scan   Scattered hypermetabolic osseous lesions, including the calvarium, ribs, left scapula, sacrum, and left femoral shaft. An expansile left vertebral body lesion at L3 extends into the epidural space of the left lateral recess,    03/17/2014 - 04/07/2014 Chemotherapy   Velcade and Dexamethasone due to initial denial of Revlimid by insurance company.  Zometa monthly.   03/22/2014 Imaging   MR_ L-Spine- Enhancing lesions compatible with multiple myeloma at L2, L3, and S1-2.   04/07/2014 - 07/28/2014 Chemotherapy   RVD with Revlimid at 25 mg days 1-14.  Revlimid dose reduced to 25 mg every other day x 14 days with 7 day respite beginning on day 1 of cycle 2.   04/17/2014 Adverse Reaction   Revlimid-induced rash.  Treated with steroids.   07/28/2014 - 08/04/2014 Chemotherapy   Revlimid daily   08/04/2014 Adverse Reaction   Velcade-induced peripheral neuropathy   08/04/2014 Treatment Plan Change   D/C Velcade   08/11/2014 PET scan   Response to therapy. Improvement and resolution of foci of osseous hypermetabolism.   08/22/2014 - 08/28/2014 Chemotherapy   Revlimid 25 mg days 1-21 every 28 days   08/28/2014 Adverse Reaction   Revlimid-induced rash. Medication held.  Medrol dose Pak prescribed.   09/04/2014 - 01/08/2015 Chemotherapy   Revlimid 10 mg every other day, without dexamethasone   01/08/2015 - 01/15/2015 Hospital Admission   Colonic diverticular abscess with IR drain placement   03/02/2015 PET scan   Only bony uptake is low activity at site of deformity/callus at R second rib FX, high activity in sigmoid colon, advanced sigmoid divertidulosis. Abscess not resolved?   06/04/2015 Imaging   CT nonobstructive L renal calculus, diverticulosis of descending and sigmoid colon without inflammation, moderate prostatic enlargement   07/12/2015 Surgery   Diverticulitis s/p robotic sigmoid colectomy with Dr. Johney Maine   08/23/2015 PET scan   Primarily similar hypermetabolic osseous foci within the R sided ribs. new L third rib focus of hypermetab and sclerosis is favored to be related to healing FX. no soft tissue myeloma id'd. Presumed postop hypermetab and edema about the R pelvic wall    03/06/2016 PET scan   1. Reduced activity in the prior lesion such as the right second and fifth rib lesions, activity nearly completely resolved and significantly less than added mediastinal blood pool activity. No new lesions are identified. 2. Other imaging findings of potential clinical significance: Coronary, aortic  arch, and branch vessel atherosclerotic vascular disease. Aortoiliac atherosclerotic vascular disease. Enlarged prostate gland. Colonic diverticula.   02/17/2017 PET scan   HEAD/NECK: No hypermetabolic activity in the scalp. No hypermetabolic cervical lymph nodes. CHEST: No hypermetabolic mediastinal or hilar nodes.  Right upper lobe scarring/atelectasis. No suspicious pulmonary nodules on the CT scan. ABDOMEN/PELVIS: No abnormal hypermetabolic activity within the liver, pancreas, adrenal glands, or spleen. No hypermetabolic lymph nodes in the abdomen or pelvis. Atherosclerotic calcifications of the abdominal aorta and branch vessels. Left renal sinus cysts. Sigmoid diverticulosis, without evidence of diverticulitis. Prostatomegaly. SKELETON: Vague hypermetabolism involving the inferior sternum, max SUV 3.2, previously 8.2. Focal hypermetabolism involving the left sacrum, max SUV 4.0, previously 6.7. EXTREMITIES: No abnormal hypermetabolic activity in the lower extremities.   02/18/2017 -  Chemotherapy   Bortezomib 1.'3mg'$ /m2 QWk + Dexamethasone '10mg'$  QTue/Fri --Cycle #1, 02/18/17    02/18/2017 - 08/26/2017 Chemotherapy   The patient had bortezomib SQ (VELCADE) chemo injection 3 mg, 1.3 mg/m2 = 3 mg, Subcutaneous,  Once, 7 of 9 cycles Administration: 3 mg (02/18/2017), 3 mg (02/26/2017), 3 mg (03/04/2017), 3 mg (03/11/2017), 3 mg (04/08/2017), 3 mg (03/18/2017), 3 mg (03/25/2017), 3 mg (04/01/2017), 3 mg (05/06/2017), 3 mg (05/20/2017), 3 mg (06/03/2017), 3 mg (06/17/2017), 3 mg (07/01/2017), 3 mg (07/15/2017), 3 mg (07/29/2017), 3 mg (08/12/2017), 3 mg (08/26/2017)  for chemotherapy treatment.    09/17/2017 -  Chemotherapy   The patient had daratumumab-hyaluronidase-fihj (DARZALEX FASPRO) 1800-30000 MG-UT/15ML chemo SQ injection 1,800 mg, 1,800 mg, Subcutaneous,  Once, 6 of 9 cycles Administration: 1,800 mg (10/25/2018), 1,800 mg (11/22/2018), 1,800 mg (12/20/2018), 1,800 mg (01/17/2019), 1,800 mg (02/16/2019), 1,800 mg (03/16/2019) daratumumab (DARZALEX) 1,800 mg in sodium chloride 0.9 % 910 mL (1.8 mg/mL) chemo infusion, 15.9 mg/kg = 1,820 mg, Intravenous, Once, 1 of 1 cycle Administration: 1,800 mg (09/17/2017) daratumumab (DARZALEX) 1,800 mg in sodium chloride 0.9 % 410 mL (3.6 mg/mL) chemo infusion, 1,820 mg, Intravenous, Once, 13 of  13 cycles Administration: 1,800 mg (10/28/2017), 1,800 mg (11/04/2017), 1,800 mg (11/11/2017), 1,800 mg (11/18/2017), 1,800 mg (11/25/2017), 1,800 mg (12/02/2017), 1,800 mg (12/09/2017), 1,800 mg (12/16/2017), 1,800 mg (12/30/2017), 1,800 mg (04/26/2018), 1,800 mg (01/13/2018), 1,800 mg (02/12/2018), 1,800 mg (02/26/2018), 1,800 mg (03/15/2018), 1,800 mg (03/29/2018), 1,800 mg (04/12/2018), 1,800 mg (05/10/2018), 1,800 mg (06/07/2018), 1,800 mg (07/05/2018), 1,800 mg (08/02/2018), 1,800 mg (08/30/2018), 1,800 mg (09/27/2018)  for chemotherapy treatment.       CANCER STAGING: Cancer Staging No matching staging information was found for the patient.   INTERVAL HISTORY:  Mr. Vanaman 84 y.o. male seen for follow-up of multiple myeloma.  He is receiving monthly daratumumab.  He reportedly fell last week.  He reports lower back pain of 10 out of 10.  He also reports right hip pain.  He was evaluated by EMS at home and refused to come to the ER.  He denies any severe swellings at this time.  He has constipation which is baseline.  He reports weakness and about 3 to 5 pound weight loss.   REVIEW OF SYSTEMS:  Review of Systems  Constitutional: Positive for fatigue.  Cardiovascular: Positive for leg swelling.  Gastrointestinal: Positive for constipation.  Musculoskeletal: Positive for back pain.  Neurological: Positive for numbness.  All other systems reviewed and are negative.    PAST MEDICAL/SURGICAL HISTORY:  Past Medical History:  Diagnosis Date  . BPH (benign prostatic hyperplasia)   . Colonic diverticular abscess 01/08/2015  . Diverticulitis 57/84/69   complicated by abscess and required percutaneous drainage  .  GERD (gastroesophageal reflux disease)   . H/O ETOH abuse   . History of chemotherapy last done jan 2017  . History of radiation therapy 01/05/13-02/10/13   45 gray to left occipital condyle region  . History of radiation therapy 12/01/16-12/10/16   Parasternal nodule, chest- 24 Gy total delivered  in 8 fractions, Left sacro-iliac, pelvis- 24 Gy total delivered in 8 fractions   . History of radiation therapy 02/19/17-02/22/17   right temporal scalp 30 Gy in 10 fractions  . Intra-abdominal abscess ([place])   . Multiple myeloma ([place]) 2015  . Neuropathy   . Radiation 02/21/14-03/08/14   right posterior chest wall area 30 gray  . Radiation 04/19/14-05/02/14   lumbar spine 25 gray  . Skull lesion    Left occipital condyle  . Wrist fracture, left    x 2   Past Surgical History:  Procedure Laterality Date  . COLONOSCOPY N/A 04/19/2015   Procedure: COLONOSCOPY;  Surgeon: Danie Binder, MD;  Location: [facility];  Service: Endoscopy;  Laterality: N/A;  1:30 PM  . CYST EXCISION  1959   tail bone  . IR IMAGING GUIDED PORT INSERTION  09/25/2017     SOCIAL HISTORY:  Social History   Socioeconomic History  . Marital status: Married    Spouse name: Not on file  . Number of children: 3  . Years of education: Not on file  . Highest education level: Not on file  Occupational History    Employer: RETIRED  Tobacco Use  . Smoking status: Never Smoker  . Smokeless tobacco: Never Used  Substance and Sexual Activity  . Alcohol use: No    Comment: " Not much no more"  . Drug use: No  . Sexual activity: Not on file  Other Topics Concern  . Not on file  Social History Narrative  . Not on file   Social Determinants of Health   Financial Resource Strain:   . Difficulty of Paying Living Expenses:   Food Insecurity:   . Worried About Charity fundraiser in the Last Year:   . Arboriculturist in the Last Year:   Transportation Needs:   . Film/video editor (Medical):   Marland Kitchen Lack of Transportation (Non-Medical):   Physical Activity:   . Days of Exercise per Week:   . Minutes of Exercise per Session:   Stress:   . Feeling of Stress :   Social Connections:   . Frequency of Communication with Friends and Family:   . Frequency of Social Gatherings with Friends and Family:   . Attends  Religious Services:   . Active Member of Clubs or Organizations:   . Attends Archivist Meetings:   Marland Kitchen Marital Status:   Intimate Partner Violence:   . Fear of Current or Ex-Partner:   . Emotionally Abused:   Marland Kitchen Physically Abused:   . Sexually Abused:     FAMILY HISTORY:  Family History  Problem Relation Age of Onset  . Diabetes Mother   . Stroke Mother   . Hypertension Father     CURRENT MEDICATIONS:  Outpatient Encounter Medications as of 04/14/2019  Medication Sig Note  . acyclovir (ZOVIRAX) 400 MG tablet TAKE 1 TABLET BY MOUTH 2 TIMES DAILY   . ALPRAZolam (XANAX) 1 MG tablet TAKE 2 TABLETS BY MOUTH AT BEDTIME AS NEEDED FOR ANXIETY OR SLEEP   . amoxicillin-clavulanate (AUGMENTIN) 875-125 MG tablet Take 1 tablet by mouth 2 (two) times daily.   Marland Kitchen azithromycin (  ZITHROMAX) 250 MG tablet Take 2 tablets ('500mg'$ ) Day 1 and 1 tablet ('250mg'$ ) Day 2-5.   Marland Kitchen CALCIUM-VITAMIN D PO Take 1 tablet by mouth 2 (two) times daily.    Marland Kitchen dexamethasone (DECADRON) 4 MG tablet Take 2.5 tablets (10 mg total) by mouth once a week. Take 10 mg once weekly 09/18/2017: Started on 09/18/17  . diclofenac Sodium (VOLTAREN) 1 % GEL Apply topically as needed. Pt has not picked up yet   . furosemide (LASIX) 20 MG tablet TAKE 1 TABLET BY MOUTH DAILY AS NEEDED FOR EDEMA.   Marland Kitchen gabapentin (NEURONTIN) 400 MG capsule TAKE 1 CAPSULE BY MOUTH 3 TIMES DAILY   . Homeopathic Products ([place]) Apply topically as needed.   . lidocaine-prilocaine (EMLA) cream Apply small amount over port one (1) hour prior to appointment.   Marland Kitchen MAGNESIUM PO Take 1 tablet by mouth at bedtime.   . montelukast (SINGULAIR) 10 MG tablet Take 1 tablet (10 mg total) by mouth at bedtime.   Marland Kitchen omeprazole (PRILOSEC) 20 MG capsule TAKE 1 CAPSULE BY MOUTH ONCE DAILY   . Oxycodone HCl 10 MG TABS TAKE 1 TABLET BY MOUTH EVERY 8 HOURS AS NEEDED FOR PAIN   . senna-docusate (SENOKOT-S) 8.6-50 MG tablet Take 1 tablet by mouth 2 (two) times daily.   .  Suvorexant (BELSOMRA) 10 MG TABS Take 10 mg by mouth at bedtime as needed.   . tamsulosin (FLOMAX) 0.4 MG CAPS capsule TAKE ONE CAPSULE BY MOUTH EVERY NIGHT ATBEDTIME   . tiZANidine (ZANAFLEX) 4 MG tablet Take 1 tablet (4 mg total) by mouth every 6 (six) hours as needed (headache).   . traZODone (DESYREL) 100 MG tablet Take 1 tablet (100 mg total) by mouth at bedtime. May titrate up to 300 mg nightly.   . zolendronic acid (ZOMETA) 4 MG/5ML injection Inject 4 mg into the vein every 30 (thirty) days.     No facility-administered encounter medications on file as of 04/14/2019.    ALLERGIES:  Allergies  Allergen Reactions  . Codeine Other (See Comments)    Headache  . Morphine And Related Other (See Comments)    Makes him feel weird  . Revlimid [Lenalidomide] Other (See Comments)    "Causes me to become weak"     PHYSICAL EXAM:  ECOG Performance status: 1  There were no vitals filed for this visit. There were no vitals filed for this visit.  Physical Exam Vitals reviewed.  Constitutional:      Appearance: Normal appearance.  Cardiovascular:     Rate and Rhythm: Normal rate and regular rhythm.     Heart sounds: Normal heart sounds.  Pulmonary:     Effort: Pulmonary effort is normal.     Breath sounds: Normal breath sounds.  Abdominal:     General: There is no distension.     Palpations: Abdomen is soft. There is no mass.  Musculoskeletal:        General: No swelling.  Skin:    General: Skin is warm.  Neurological:     General: No focal deficit present.     Mental Status: He is alert and oriented to person, place, and time.  Psychiatric:        Mood and Affect: Mood normal.        Behavior: Behavior normal.      LABORATORY DATA:  I have reviewed the labs as listed.  CBC    Component Value Date/Time   WBC 5.0 04/14/2019 0919   RBC  4.39 04/14/2019 0919   HGB 13.4 04/14/2019 0919   HCT 41.1 04/14/2019 0919   PLT 115 (L) 04/14/2019 0919   MCV 93.6 04/14/2019 0919     MCH 30.5 04/14/2019 0919   MCHC 32.6 04/14/2019 0919   RDW 13.8 04/14/2019 0919   LYMPHSABS 0.8 04/14/2019 0919   MONOABS 0.5 04/14/2019 0919   EOSABS 0.0 04/14/2019 0919   BASOSABS 0.0 04/14/2019 0919   CMP Latest Ref Rng & Units 04/14/2019 04/12/2019 03/16/2019  Glucose 70 - 99 mg/dL 89 101(H) 93  BUN 8 - 23 mg/dL 19 24(H) 19  Creatinine 0.61 - 1.24 mg/dL 0.76 0.80 0.90  Sodium 135 - 145 mmol/L 137 139 140  Potassium 3.5 - 5.1 mmol/L 3.7 4.0 3.8  Chloride 98 - 111 mmol/L 105 106 107  CO2 22 - 32 mmol/L '25 25 26  '$ Calcium 8.9 - 10.3 mg/dL 8.0(L) 8.5(L) 8.8(L)  Total Protein 6.5 - 8.1 g/dL 5.2(L) 5.6(L) 5.6(L)  Total Bilirubin 0.3 - 1.2 mg/dL 1.0 0.8 0.6  Alkaline Phos 38 - 126 U/L 18(L) 19(L) 19(L)  AST 15 - 41 U/L '17 17 16  '$ ALT 0 - 44 U/L '13 14 13       '$ DIAGNOSTIC IMAGING:  I have reviewed his scans.   I have reviewed Venita Lick LPN's note and agree with the documentation.  I personally performed a face-to-face visit, made revisions and my assessment and plan is as follows.    ASSESSMENT & PLAN:   Multiple myeloma without remission ([place]) 1.  Recurrent IgG kappa multiple myeloma: -Currently on subcu daratumumab. -Myeloma panel on 03/16/2019 shows M spike of 0.1 g.  Free light chain ratio was not done. -We reviewed labs from today.  Hemoglobin is 13.4.  White count is normal with platelet count 114. -He reportedly fell last week.  He is feeling very weak.  He also complains of right hip pain and low back pain. -I will hold his treatment today.  He also lost about 5 pounds. -I plan to order MRI of the thoracic and lumbar spine.  We will also order MRI of the pelvis. -We will see him back in 2 weeks to discuss results and further plan. -He will receive 1 L of fluid with electrolytes today as his blood pressure is borderline low.  2.  Low back pain: -He is taking oxycodone 10 mg 3 times a day.  Pain is well controlled.  3.  Bone protection: -He will continue  zoledronic acid every 8 weeks.  Calcium and creatinine are normal.  4.  Peripheral neuropathy: -She will continue gabapentin 3 times a day.  5.  Sleeping difficulty: -He is taking trazodone along with Xanax which is helping.  He has used multiple other medications which did not help.     Orders placed this encounter:  Orders Placed This Encounter  Procedures  . MR Lumbar Spine W Contrast  . MR Thoracic Spine W Contrast  . MR Pelvis W Contrast      Derek Jack, MD [place] [phone]

## 2019-04-14 NOTE — Assessment & Plan Note (Addendum)
1.  Recurrent IgG kappa multiple myeloma: -Currently on subcu daratumumab. -Myeloma panel on 03/16/2019 shows M spike of 0.1 g.  Free light chain ratio was not done. -We reviewed labs from today.  Hemoglobin is 13.4.  White count is normal with platelet count 114. -He reportedly fell last week.  He is feeling very weak.  He also complains of right hip pain and low back pain. -I will hold his treatment today.  He also lost about 5 pounds. -I plan to order MRI of the thoracic and lumbar spine.  We will also order MRI of the pelvis. -We will see him back in 2 weeks to discuss results and further plan. -He will receive 1 L of fluid with electrolytes today as his blood pressure is borderline low.  2.  Low back pain: -He is taking oxycodone 10 mg 3 times a day.  Pain is well controlled.  3.  Bone protection: -He will continue zoledronic acid every 8 weeks.  Calcium and creatinine are normal.  4.  Peripheral neuropathy: -She will continue gabapentin 3 times a day.  5.  Sleeping difficulty: -He is taking trazodone along with Xanax which is helping.  He has used multiple other medications which did not help.

## 2019-04-14 NOTE — Patient Instructions (Addendum)
[place] at [facility] Discharge Instructions  You were seen today by Dr. Delton Coombes. He went over your recent lab results. He will hold your treatment today and just give you fluids. He will repeat a MRI prior to your next visit. He will see you back in 2 weeks for labs, treatment and follow up.   Thank you for choosing [place] at [facility] to provide your oncology and hematology care.  To afford each patient quality time with our provider, please arrive at least 15 minutes before your scheduled appointment time.   If you have a lab appointment with the [facility] please come in thru the  Main Entrance and check in at the main information desk  You need to re-schedule your appointment should you arrive 10 or more minutes late.  We strive to give you quality time with our providers, and arriving late affects you and other patients whose appointments are after yours.  Also, if you no show three or more times for appointments you may be dismissed from the clinic at the providers discretion.     Again, thank you for choosing [facility].  Our hope is that these requests will decrease the amount of time that you wait before being seen by our physicians.       _____________________________________________________________  Should you have questions after your visit to [facility], please contact our office at [phone] between the hours of 8:00 a.m. and 4:30 p.m.  Voicemails left after 4:00 p.m. will not be returned until the following business day.  For prescription refill requests, have your pharmacy contact our office and allow 72 hours.    Cancer Center Support Programs:   > Cancer Support Group  2nd Tuesday of the month 1pm-2pm, Journey Room

## 2019-04-15 LAB — PROTEIN ELECTROPHORESIS, SERUM
A/G Ratio: 1.5 (ref 0.7–1.7)
Albumin ELP: 3 g/dL (ref 2.9–4.4)
Alpha-1-Globulin: 0.2 g/dL (ref 0.0–0.4)
Alpha-2-Globulin: 0.5 g/dL (ref 0.4–1.0)
Beta Globulin: 0.7 g/dL (ref 0.7–1.3)
Gamma Globulin: 0.6 g/dL (ref 0.4–1.8)
Globulin, Total: 2 g/dL — ABNORMAL LOW (ref 2.2–3.9)
M-Spike, %: 0.3 g/dL — ABNORMAL HIGH
Total Protein ELP: 5 g/dL — ABNORMAL LOW (ref 6.0–8.5)

## 2019-04-15 LAB — KAPPA/LAMBDA LIGHT CHAINS
Kappa free light chain: 12.9 mg/L (ref 3.3–19.4)
Kappa, lambda light chain ratio: 3.79 — ABNORMAL HIGH (ref 0.26–1.65)
Lambda free light chains: 3.4 mg/L — ABNORMAL LOW (ref 5.7–26.3)

## 2019-04-22 ENCOUNTER — Other Ambulatory Visit (HOSPITAL_COMMUNITY): Payer: Self-pay | Admitting: *Deleted

## 2019-04-22 DIAGNOSIS — C9000 Multiple myeloma not having achieved remission: Secondary | ICD-10-CM

## 2019-04-22 LAB — KAPPA/LAMBDA LIGHT CHAINS

## 2019-04-22 NOTE — Progress Notes (Signed)
Orders corrected for scans and upcoming labs

## 2019-04-27 ENCOUNTER — Ambulatory Visit
Admission: RE | Admit: 2019-04-27 | Discharge: 2019-04-27 | Disposition: A | Discharge status: Home / Self Care | Payer: Medicare Other | Source: Ambulatory Visit | Attending: Hematology | Admitting: Hematology

## 2019-04-27 ENCOUNTER — Other Ambulatory Visit: Payer: Self-pay

## 2019-04-27 DIAGNOSIS — C9000 Multiple myeloma not having achieved remission: Secondary | ICD-10-CM

## 2019-04-27 DIAGNOSIS — R296 Repeated falls: Secondary | ICD-10-CM | POA: Diagnosis not present

## 2019-04-27 DIAGNOSIS — M48061 Spinal stenosis, lumbar region without neurogenic claudication: Secondary | ICD-10-CM | POA: Diagnosis not present

## 2019-04-27 DIAGNOSIS — S3210XA Unspecified fracture of sacrum, initial encounter for closed fracture: Secondary | ICD-10-CM | POA: Diagnosis not present

## 2019-04-27 MED ORDER — GADOBUTROL 1 MMOL/ML IV SOLN
9.0000 mL | Freq: Once | INTRAVENOUS | Status: AC | PRN
  Administered 2019-04-27: 9 mL via INTRAVENOUS

## 2019-04-28 ENCOUNTER — Encounter (HOSPITAL_COMMUNITY): Payer: Self-pay | Admitting: Hematology

## 2019-04-28 ENCOUNTER — Inpatient Hospital Stay (HOSPITAL_COMMUNITY): Payer: Medicare Other

## 2019-04-28 ENCOUNTER — Inpatient Hospital Stay (HOSPITAL_BASED_OUTPATIENT_CLINIC_OR_DEPARTMENT_OTHER): Payer: Medicare Other | Admitting: Hematology

## 2019-04-28 VITALS — BP 126/71 | HR 82 | Temp 97.1°F | Resp 18 | Wt 201.8 lb

## 2019-04-28 VITALS — BP 118/56 | HR 61 | Temp 96.9°F | Resp 18

## 2019-04-28 DIAGNOSIS — C9000 Multiple myeloma not having achieved remission: Secondary | ICD-10-CM

## 2019-04-28 LAB — COMPREHENSIVE METABOLIC PANEL
ALT: 19 U/L (ref 0–44)
AST: 24 U/L (ref 15–41)
Albumin: 3.5 g/dL (ref 3.5–5.0)
Alkaline Phosphatase: 20 U/L — ABNORMAL LOW (ref 38–126)
Anion gap: 11 (ref 5–15)
BUN: 20 mg/dL (ref 8–23)
CO2: 26 mmol/L (ref 22–32)
Calcium: 8.6 mg/dL — ABNORMAL LOW (ref 8.9–10.3)
Chloride: 102 mmol/L (ref 98–111)
Creatinine, Ser: 0.79 mg/dL (ref 0.61–1.24)
GFR calc Af Amer: >60 mL/min (ref >60)
GFR calc non Af Amer: >60 mL/min (ref >60)
Glucose, Bld: 90 mg/dL (ref 70–99)
Potassium: 3.1 mmol/L — ABNORMAL LOW (ref 3.5–5.1)
Sodium: 139 mmol/L (ref 135–145)
Total Bilirubin: 1.2 mg/dL (ref 0.3–1.2)
Total Protein: 5.2 g/dL — ABNORMAL LOW (ref 6.5–8.1)

## 2019-04-28 LAB — CBC WITH DIFFERENTIAL/PLATELET
Abs Immature Granulocytes: 0.01 10*3/uL (ref 0.00–0.07)
Basophils Absolute: 0 10*3/uL (ref 0.0–0.1)
Basophils Relative: 0 %
Eosinophils Absolute: 0 10*3/uL (ref 0.0–0.5)
Eosinophils Relative: 1 %
HCT: 40.3 % (ref 39.0–52.0)
Hemoglobin: 13.4 g/dL (ref 13.0–17.0)
Immature Granulocytes: 0 %
Lymphocytes Relative: 19 %
Lymphs Abs: 0.9 10*3/uL (ref 0.7–4.0)
MCH: 30.7 pg (ref 26.0–34.0)
MCHC: 33.3 g/dL (ref 30.0–36.0)
MCV: 92.4 fL (ref 80.0–100.0)
Monocytes Absolute: 0.6 10*3/uL (ref 0.1–1.0)
Monocytes Relative: 12 %
Neutro Abs: 3.5 10*3/uL (ref 1.7–7.7)
Neutrophils Relative %: 68 %
Platelets: 110 10*3/uL — ABNORMAL LOW (ref 150–400)
RBC: 4.36 MIL/uL (ref 4.22–5.81)
RDW: 14.1 % (ref 11.5–15.5)
WBC: 5.1 10*3/uL (ref 4.0–10.5)
nRBC: 0 % (ref 0.0–0.2)

## 2019-04-28 MED ORDER — POTASSIUM CHLORIDE CRYS ER 20 MEQ PO TBCR
40.0000 meq | EXTENDED_RELEASE_TABLET | Freq: Once | ORAL | Status: AC
  Administered 2019-04-28: 40 meq via ORAL

## 2019-04-28 MED ORDER — POTASSIUM CHLORIDE CRYS ER 20 MEQ PO TBCR
EXTENDED_RELEASE_TABLET | ORAL | Status: AC
  Filled 2019-04-28: qty 2

## 2019-04-28 MED ORDER — SODIUM CHLORIDE 0.9% FLUSH
10.0000 mL | Freq: Once | INTRAVENOUS | Status: AC | PRN
  Administered 2019-04-28: 10 mL

## 2019-04-28 MED ORDER — ZOLEDRONIC ACID 4 MG/100ML IV SOLN
4.0000 mg | Freq: Once | INTRAVENOUS | Status: AC
  Administered 2019-04-28: 11:00:00 4 mg via INTRAVENOUS
  Filled 2019-04-28: qty 100

## 2019-04-28 MED ORDER — HEPARIN SOD (PORK) LOCK FLUSH 100 UNIT/ML IV SOLN
500.0000 [IU] | Freq: Once | INTRAVENOUS | Status: AC | PRN
  Administered 2019-04-28: 11:00:00 500 [IU]

## 2019-04-28 MED ORDER — SODIUM CHLORIDE 0.9 % IV SOLN: Freq: Once | INTRAVENOUS | Status: AC

## 2019-04-28 NOTE — Assessment & Plan Note (Signed)
1.  Recurrent IgG kappa multiple myeloma: -Currently on daratumumab subcutaneous regimen. -Myeloma panel on 04/14/2019 shows M spike 0.3 g, up from 0.1 g.  Free light chain ratio is 3.79, increased from 3.03. -We reviewed results of the MRI of the thoracic spine which showed no myeloma lesions.  Right first rib unchanged.  Left rib lesion has improved.  Left sacral ala vertical fracture present.  Spinal stenosis at L3-L4.  Compressive fracture of L2 and L4. -He reportedly fell 4 times since last visit.  He is feeling weak. -I have called and talked to his daughter.  We will hold his treatment at this time. -I will reevaluate him in 4 weeks.  2.  Low back pain: -He is taking oxycodone 10 mg 3 times a day.  Pain is reasonably well controlled.  3.  Bone protection: -He will receive zoledronic acid today.  He is receiving it every 8 weeks.  4.  Peripheral neuropathy: -He will continue gabapentin 3 times a day.  5.  Sleeping difficulty: -He will continue trazodone along with Xanax.  6.  Hypokalemia: -Potassium today is 3.1.  He will receive potassium orally 40 mEq.

## 2019-04-28 NOTE — Progress Notes (Signed)
Dean [name], [place] [address]   CLINIC:  Medical Oncology/Hematology  PCP:  [name], MD [address] [place] [address] [phone]   REASON FOR VISIT:  Follow-up for  IgG kappa multiple myeloma   BRIEF ONCOLOGIC HISTORY:  Oncology History  Multiple myeloma without remission ([place])  11/16/2012 Initial Diagnosis   L occipital condyle destructive lesion, not biopsied secondary to location. XRT   12/01/2012 Imaging   L3 superior endplate compression FX, no lytic lesions to suggest myeloma   02/06/2014 Imaging   soft tissue mass along the right posterior fifth rib with some rib destruction   02/16/2014 Miscellaneous   Normal CBC, Normal CMP, kappa,lamda ratio of 2.62 (abnormal), UIEP with slightly restricted band, IgG kappa, SPEP/IEP with monoclonal protein at 0.79 g/dl, normal igG, suppressed IGM   02/20/2014 Bone Marrow Biopsy   33% kappa restricted plasma cells Normal FISH, normal cytogenetics   02/21/2014 - 03/08/2014 Radiation Therapy   30Gy to R rib lesion, lesion not biopsied   03/16/2014 PET scan   Scattered hypermetabolic osseous lesions, including the calvarium, ribs, left scapula, sacrum, and left femoral shaft. An expansile left vertebral body lesion at L3 extends into the epidural space of the left lateral recess,    03/17/2014 - 04/07/2014 Chemotherapy   Velcade and Dexamethasone due to initial denial of Revlimid by insurance company.  Zometa monthly.   03/22/2014 Imaging   MR_ L-Spine- Enhancing lesions compatible with multiple myeloma at L2, L3, and S1-2.   04/07/2014 - 07/28/2014 Chemotherapy   RVD with Revlimid at 25 mg days 1-14.  Revlimid dose reduced to 25 mg every other day x 14 days with 7 day respite beginning on day 1 of cycle 2.   04/17/2014 Adverse Reaction   Revlimid-induced rash.  Treated with steroids.   07/28/2014 - 08/04/2014 Chemotherapy   Revlimid daily   08/04/2014 Adverse Reaction   Velcade-induced peripheral neuropathy   08/04/2014 Treatment Plan Change   D/C Velcade   08/11/2014 PET scan   Response to therapy. Improvement and resolution of foci of osseous hypermetabolism.   08/22/2014 - 08/28/2014 Chemotherapy   Revlimid 25 mg days 1-21 every 28 days   08/28/2014 Adverse Reaction   Revlimid-induced rash. Medication held.  Medrol dose Pak prescribed.   09/04/2014 - 01/08/2015 Chemotherapy   Revlimid 10 mg every other day, without dexamethasone   01/08/2015 - 01/15/2015 Hospital Admission   Colonic diverticular abscess with IR drain placement   03/02/2015 PET scan   Only bony uptake is low activity at site of deformity/callus at R second rib FX, high activity in sigmoid colon, advanced sigmoid divertidulosis. Abscess not resolved?   06/04/2015 Imaging   CT nonobstructive L renal calculus, diverticulosis of descending and sigmoid colon without inflammation, moderate prostatic enlargement   07/12/2015 Surgery   Diverticulitis s/p robotic sigmoid colectomy with Dr. Johney Maine   08/23/2015 PET scan   Primarily similar hypermetabolic osseous foci within the R sided ribs. new L third rib focus of hypermetab and sclerosis is favored to be related to healing FX. no soft tissue myeloma id'd. Presumed postop hypermetab and edema about the R pelvic wall    03/06/2016 PET scan   1. Reduced activity in the prior lesion such as the right second and fifth rib lesions, activity nearly completely resolved and significantly less than added mediastinal blood pool activity. No new lesions are identified. 2. Other imaging findings of potential clinical significance: Coronary, aortic  arch, and branch vessel atherosclerotic vascular disease. Aortoiliac atherosclerotic vascular disease. Enlarged prostate gland. Colonic diverticula.   02/17/2017 PET scan   HEAD/NECK: No hypermetabolic activity in the scalp. No hypermetabolic cervical lymph nodes. CHEST: No hypermetabolic mediastinal or hilar nodes.  Right upper lobe scarring/atelectasis. No suspicious pulmonary nodules on the CT scan. ABDOMEN/PELVIS: No abnormal hypermetabolic activity within the liver, pancreas, adrenal glands, or spleen. No hypermetabolic lymph nodes in the abdomen or pelvis. Atherosclerotic calcifications of the abdominal aorta and branch vessels. Left renal sinus cysts. Sigmoid diverticulosis, without evidence of diverticulitis. Prostatomegaly. SKELETON: Vague hypermetabolism involving the inferior sternum, max SUV 3.2, previously 8.2. Focal hypermetabolism involving the left sacrum, max SUV 4.0, previously 6.7. EXTREMITIES: No abnormal hypermetabolic activity in the lower extremities.   02/18/2017 -  Chemotherapy   Bortezomib 1.42m/m2 QWk + Dexamethasone 168mQTue/Fri --Cycle #1, 02/18/17    02/18/2017 - 08/26/2017 Chemotherapy   The patient had bortezomib SQ (VELCADE) chemo injection 3 mg, 1.3 mg/m2 = 3 mg, Subcutaneous,  Once, 7 of 9 cycles Administration: 3 mg (02/18/2017), 3 mg (02/26/2017), 3 mg (03/04/2017), 3 mg (03/11/2017), 3 mg (04/08/2017), 3 mg (03/18/2017), 3 mg (03/25/2017), 3 mg (04/01/2017), 3 mg (05/06/2017), 3 mg (05/20/2017), 3 mg (06/03/2017), 3 mg (06/17/2017), 3 mg (07/01/2017), 3 mg (07/15/2017), 3 mg (07/29/2017), 3 mg (08/12/2017), 3 mg (08/26/2017)  for chemotherapy treatment.    09/17/2017 -  Chemotherapy   The patient had daratumumab-hyaluronidase-fihj (DARZALEX FASPRO) 1800-30000 MG-UT/15ML chemo SQ injection 1,800 mg, 1,800 mg, Subcutaneous,  Once, 6 of 9 cycles Administration: 1,800 mg (10/25/2018), 1,800 mg (11/22/2018), 1,800 mg (12/20/2018), 1,800 mg (01/17/2019), 1,800 mg (02/16/2019), 1,800 mg (03/16/2019) daratumumab (DARZALEX) 1,800 mg in sodium chloride 0.9 % 910 mL (1.8 mg/mL) chemo infusion, 15.9 mg/kg = 1,820 mg, Intravenous, Once, 1 of 1 cycle Administration: 1,800 mg (09/17/2017) daratumumab (DARZALEX) 1,800 mg in sodium chloride 0.9 % 410 mL (3.6 mg/mL) chemo infusion, 1,820 mg, Intravenous, Once, 13 of  13 cycles Administration: 1,800 mg (10/28/2017), 1,800 mg (11/04/2017), 1,800 mg (11/11/2017), 1,800 mg (11/18/2017), 1,800 mg (11/25/2017), 1,800 mg (12/02/2017), 1,800 mg (12/09/2017), 1,800 mg (12/16/2017), 1,800 mg (12/30/2017), 1,800 mg (04/26/2018), 1,800 mg (01/13/2018), 1,800 mg (02/12/2018), 1,800 mg (02/26/2018), 1,800 mg (03/15/2018), 1,800 mg (03/29/2018), 1,800 mg (04/12/2018), 1,800 mg (05/10/2018), 1,800 mg (06/07/2018), 1,800 mg (07/05/2018), 1,800 mg (08/02/2018), 1,800 mg (08/30/2018), 1,800 mg (09/27/2018)  for chemotherapy treatment.       CANCER STAGING: Cancer Staging No matching staging information was found for the patient.   INTERVAL HISTORY:  Mr. HeGoffe89.o. male seen for follow-up of multiple myeloma and follow-up of MRI.  He fell about 4 times since last visit.  Appetite is 50%.  Energy levels are 25%.  Reports some constipation.  Pain is reasonably well controlled with oxycodone 3 times a day.  He reports headaches occasionally.  Denies any lightheadedness or loss of consciousness with falls.  Sleep is better.   REVIEW OF SYSTEMS:  Review of Systems  Gastrointestinal: Positive for constipation.  Musculoskeletal: Positive for back pain.  Neurological: Positive for numbness.  All other systems reviewed and are negative.    PAST MEDICAL/SURGICAL HISTORY:  Past Medical History:  Diagnosis Date  . BPH (benign prostatic hyperplasia)   . Colonic diverticular abscess 01/08/2015  . Diverticulitis 1157/84/69 complicated by abscess and required percutaneous drainage  . GERD (gastroesophageal reflux disease)   . H/O ETOH abuse   . History of chemotherapy last done jan 2017  . History of radiation  therapy 01/05/13-02/10/13   45 gray to left occipital condyle region  . History of radiation therapy 12/01/16-12/10/16   Parasternal nodule, chest- 24 Gy total delivered in 8 fractions, Left sacro-iliac, pelvis- 24 Gy total delivered in 8 fractions   . History of radiation therapy  02/19/17-02/22/17   right temporal scalp 30 Gy in 10 fractions  . Intra-abdominal abscess ([place])   . Multiple myeloma ([name]) 2015  . Neuropathy   . Radiation 02/21/14-03/08/14   right posterior chest wall area 30 gray  . Radiation 04/19/14-05/02/14   lumbar spine 25 gray  . Skull lesion    Left occipital condyle  . Wrist fracture, left    x 2   Past Surgical History:  Procedure Laterality Date  . COLONOSCOPY N/A 04/19/2015   Procedure: COLONOSCOPY;  Surgeon: Danie Binder, MD;  Location: [facility];  Service: Endoscopy;  Laterality: N/A;  1:30 PM  . CYST EXCISION  1959   tail bone  . IR IMAGING GUIDED PORT INSERTION  09/25/2017     SOCIAL HISTORY:  Social History   Socioeconomic History  . Marital status: Married    Spouse name: Not on file  . Number of children: 3  . Years of education: Not on file  . Highest education level: Not on file  Occupational History    Employer: RETIRED  Tobacco Use  . Smoking status: Never Smoker  . Smokeless tobacco: Never Used  Substance and Sexual Activity  . Alcohol use: No    Comment: " Not much no more"  . Drug use: No  . Sexual activity: Not on file  Other Topics Concern  . Not on file  Social History Narrative  . Not on file   Social Determinants of Health   Financial Resource Strain:   . Difficulty of Paying Living Expenses:   Food Insecurity:   . Worried About Charity fundraiser in the Last Year:   . Arboriculturist in the Last Year:   Transportation Needs:   . Film/video editor (Medical):   Marland Kitchen Lack of Transportation (Non-Medical):   Physical Activity:   . Days of Exercise per Week:   . Minutes of Exercise per Session:   Stress:   . Feeling of Stress :   Social Connections:   . Frequency of Communication with Friends and Family:   . Frequency of Social Gatherings with Friends and Family:   . Attends Religious Services:   . Active Member of Clubs or Organizations:   . Attends Archivist Meetings:   Marland Kitchen  Marital Status:   Intimate Partner Violence:   . Fear of Current or Ex-Partner:   . Emotionally Abused:   Marland Kitchen Physically Abused:   . Sexually Abused:     FAMILY HISTORY:  Family History  Problem Relation Age of Onset  . Diabetes Mother   . Stroke Mother   . Hypertension Father     CURRENT MEDICATIONS:  Outpatient Encounter Medications as of 04/28/2019  Medication Sig Note  . acyclovir (ZOVIRAX) 400 MG tablet TAKE 1 TABLET BY MOUTH 2 TIMES DAILY   . ALPRAZolam (XANAX) 1 MG tablet TAKE 2 TABLETS BY MOUTH AT BEDTIME AS NEEDED FOR ANXIETY OR SLEEP   . amoxicillin-clavulanate (AUGMENTIN) 875-125 MG tablet Take 1 tablet by mouth 2 (two) times daily.   Marland Kitchen azithromycin (ZITHROMAX) 250 MG tablet Take 2 tablets (511m) Day 1 and 1 tablet (2531m Day 2-5.   . Marland KitchenALCIUM-VITAMIN D PO Take 1 tablet  by mouth 2 (two) times daily.    Marland Kitchen dexamethasone (DECADRON) 4 MG tablet Take 2.5 tablets (10 mg total) by mouth once a week. Take 10 mg once weekly 09/18/2017: Started on 09/18/17  . diclofenac Sodium (VOLTAREN) 1 % GEL Apply topically as needed. Pt has not picked up yet   . furosemide (LASIX) 20 MG tablet TAKE 1 TABLET BY MOUTH DAILY AS NEEDED FOR EDEMA.   Marland Kitchen gabapentin (NEURONTIN) 400 MG capsule TAKE 1 CAPSULE BY MOUTH 3 TIMES DAILY   . Homeopathic Products ([place]) Apply topically as needed.   . lidocaine-prilocaine (EMLA) cream Apply small amount over port one (1) hour prior to appointment.   Marland Kitchen MAGNESIUM PO Take 1 tablet by mouth at bedtime.   . montelukast (SINGULAIR) 10 MG tablet Take 1 tablet (10 mg total) by mouth at bedtime.   Marland Kitchen omeprazole (PRILOSEC) 20 MG capsule TAKE 1 CAPSULE BY MOUTH ONCE DAILY   . Oxycodone HCl 10 MG TABS TAKE 1 TABLET BY MOUTH EVERY 8 HOURS AS NEEDED FOR PAIN   . senna-docusate (SENOKOT-S) 8.6-50 MG tablet Take 1 tablet by mouth 2 (two) times daily.   . Suvorexant (BELSOMRA) 10 MG TABS Take 10 mg by mouth at bedtime as needed.   . tamsulosin (FLOMAX) 0.4 MG CAPS  capsule TAKE ONE CAPSULE BY MOUTH EVERY NIGHT ATBEDTIME   . tiZANidine (ZANAFLEX) 4 MG tablet Take 1 tablet (4 mg total) by mouth every 6 (six) hours as needed (headache).   . traZODone (DESYREL) 100 MG tablet Take 1 tablet (100 mg total) by mouth at bedtime. May titrate up to 300 mg nightly.   . zolendronic acid (ZOMETA) 4 MG/5ML injection Inject 4 mg into the vein every 30 (thirty) days.     No facility-administered encounter medications on file as of 04/28/2019.    ALLERGIES:  Allergies  Allergen Reactions  . Codeine Other (See Comments)    Headache  . Morphine And Related Other (See Comments)    Makes him feel weird  . Revlimid [Lenalidomide] Other (See Comments)    "Causes me to become weak"     PHYSICAL EXAM:  ECOG Performance status: 1  Vitals:   04/28/19 0824  BP: 126/71  Pulse: 82  Resp: 18  Temp: (!) 97.1 F (36.2 C)  SpO2: 98%   Filed Weights   04/28/19 0824  Weight: 201 lb 12.8 oz (91.5 kg)    Physical Exam Vitals reviewed.  Constitutional:      Appearance: Normal appearance.  Cardiovascular:     Rate and Rhythm: Normal rate and regular rhythm.     Heart sounds: Normal heart sounds.  Pulmonary:     Effort: Pulmonary effort is normal.     Breath sounds: Normal breath sounds.  Abdominal:     General: There is no distension.     Palpations: Abdomen is soft. There is no mass.  Musculoskeletal:        General: No swelling.  Skin:    General: Skin is warm.  Neurological:     General: No focal deficit present.     Mental Status: He is alert and oriented to person, place, and time.  Psychiatric:        Mood and Affect: Mood normal.        Behavior: Behavior normal.      LABORATORY DATA:  I have reviewed the labs as listed.  CBC    Component Value Date/Time   WBC 5.1 04/28/2019 0829  RBC 4.36 04/28/2019 0829   HGB 13.4 04/28/2019 0829   HCT 40.3 04/28/2019 0829   PLT 110 (L) 04/28/2019 0829   MCV 92.4 04/28/2019 0829   MCH 30.7 04/28/2019  0829   MCHC 33.3 04/28/2019 0829   RDW 14.1 04/28/2019 0829   LYMPHSABS 0.9 04/28/2019 0829   MONOABS 0.6 04/28/2019 0829   EOSABS 0.0 04/28/2019 0829   BASOSABS 0.0 04/28/2019 0829   CMP Latest Ref Rng & Units 04/28/2019 04/14/2019 04/12/2019  Glucose 70 - 99 mg/dL 90 89 101(H)  BUN 8 - 23 mg/dL 20 19 24(H)  Creatinine 0.61 - 1.24 mg/dL 0.79 0.76 0.80  Sodium 135 - 145 mmol/L 139 137 139  Potassium 3.5 - 5.1 mmol/L 3.1(L) 3.7 4.0  Chloride 98 - 111 mmol/L 102 105 106  CO2 22 - 32 mmol/L _0 Calcium 8.9 - 10.3 mg/dL 8.6(L) 8.0(L) 8.5(L)  Total Protein 6.5 - 8.1 g/dL 5.2(L) 5.2(L) 5.6(L)  Total Bilirubin 0.3 - 1.2 mg/dL 1.2 1.0 0.8  Alkaline Phos 38 - 126 U/L 20(L) 18(L) 19(L)  AST 15 - 41 U/L _1 ALT 0 - 44 U/L _2 DIAGNOSTIC IMAGING:  I have independently reviewed scans.   I have reviewed Venita Lick LPN's note and agree with the documentation.  I personally performed a face-to-face visit, made revisions and my assessment and plan is as follows.    ASSESSMENT & PLAN:   Multiple myeloma without remission ([place]) 1.  Recurrent IgG kappa multiple myeloma: -Currently on daratumumab subcutaneous regimen. -Myeloma panel on 04/14/2019 shows M spike 0.3 g, up from 0.1 g.  Free light chain ratio is 3.79, increased from 3.03. -We reviewed results of the MRI of the thoracic spine which showed no myeloma lesions.  Right first rib unchanged.  Left rib lesion has improved.  Left sacral ala vertical fracture present.  Spinal stenosis at L3-L4.  Compressive fracture of L2 and L4. -He reportedly fell 4 times since last visit.  He is feeling weak. -I have called and talked to his daughter.  We will hold his treatment at this time. -I will reevaluate him in 4 weeks.  2.  Low back pain: -He is taking oxycodone 10 mg 3 times a day.  Pain is reasonably well controlled.  3.  Bone protection: -He will receive zoledronic acid today.  He is receiving it every 8 weeks.  4.   Peripheral neuropathy: -He will continue gabapentin 3 times a day.  5.  Sleeping difficulty: -He will continue trazodone along with Xanax.  6.  Hypokalemia: -Potassium today is 3.1.  He will receive potassium orally 40 mEq.     Orders placed this encounter:  Orders Placed This Encounter  Procedures  . CBC with Differential/Platelet  . Comprehensive metabolic panel  . Magnesium  . Lactate dehydrogenase  . Protein electrophoresis, serum  . Kappa/lambda light chains  . Lactate dehydrogenase  . Ambulatory referral to Physical Therapy      Derek Jack, [place] [phone]

## 2019-04-28 NOTE — Progress Notes (Signed)
Patient presents today for treatment and follow up visit with Dr. Delton Coombes. Vital signs within parameters for treatment today. Labs pending. Patient denies pain today. Patient states he fell since his last visit and did not seek medical attention. Patient denies any significant changes related to treatment at this time. MAR reviewed.   Message received from [facility] LPN/ Dr. Delton Coombes. HOLD tx today due to patient's falls. Return in one month for treatment. Give 44mEq of Potassium today and Zometa.   Zometa given today per MD orders. Tolerated infusion without adverse affects. Vital signs stable. No complaints at this time. Discharged from clinic via wheel chair. F/U with [facility] as scheduled.

## 2019-04-28 NOTE — Patient Instructions (Addendum)
[place] at [facility] Discharge Instructions  You were seen today by Dr. Delton Coombes. He went over your recent lab results. He will hold your treatment today. He will refer you to physical therapy for your weakness and falls.  He will see you back in 1 month for labs and follow up.   Thank you for choosing [place] at [facility] to provide your oncology and hematology care.  To afford each patient quality time with our provider, please arrive at least 15 minutes before your scheduled appointment time.   If you have a lab appointment with the [facility] please come in thru the  Main Entrance and check in at the main information desk  You need to re-schedule your appointment should you arrive 10 or more minutes late.  We strive to give you quality time with our providers, and arriving late affects you and other patients whose appointments are after yours.  Also, if you no show three or more times for appointments you may be dismissed from the clinic at the providers discretion.     Again, thank you for choosing [facility].  Our hope is that these requests will decrease the amount of time that you wait before being seen by our physicians.       _____________________________________________________________  Should you have questions after your visit to [facility], please contact our office at [phone] between the hours of 8:00 a.m. and 4:30 p.m.  Voicemails left after 4:00 p.m. will not be returned until the following business day.  For prescription refill requests, have your pharmacy contact our office and allow 72 hours.    Cancer Center Support Programs:   > Cancer Support Group  2nd Tuesday of the month 1pm-2pm, Journey Room

## 2019-04-28 NOTE — Progress Notes (Signed)
Patient has been assessed, vital signs and labs have been reviewed by Dr. Delton Coombes.He will HOLD Tx today due to pt performance. 4 recent falls. He will return in 1 month for follow up.

## 2019-04-28 NOTE — Patient Instructions (Signed)
[facility] at [facility]  Discharge Instructions:   _______________________________________________________________  Thank you for choosing [facility] at [facility] to provide your oncology and hematology care.  To afford each patient quality time with our providers, please arrive at least 15 minutes before your scheduled appointment.  You need to re-schedule your appointment if you arrive 10 or more minutes late.  We strive to give you quality time with our providers, and arriving late affects you and other patients whose appointments are after yours.  Also, if you no show three or more times for appointments you may be dismissed from the clinic.  Again, thank you for choosing [facility] at [facility]. Our hope is that these requests will allow you access to exceptional care and in a timely manner. _______________________________________________________________  If you have questions after your visit, please contact our office at [phone] between the hours of 8:30 a.m. and 5:00 p.m. Voicemails left after 4:30 p.m. will not be returned until the following business day. _______________________________________________________________  For prescription refill requests, have your pharmacy contact our office. _______________________________________________________________  Recommendations made by the consultant and any test results will be sent to your referring physician. _______________________________________________________________ 

## 2019-04-29 LAB — KAPPA/LAMBDA LIGHT CHAINS
Kappa free light chain: 14.9 mg/L (ref 3.3–19.4)
Kappa, lambda light chain ratio: 4.03 — ABNORMAL HIGH (ref 0.26–1.65)
Lambda free light chains: 3.7 mg/L — ABNORMAL LOW (ref 5.7–26.3)

## 2019-05-03 LAB — MULTIPLE MYELOMA PANEL, SERUM
Albumin SerPl Elph-Mcnc: 3.1 g/dL (ref 2.9–4.4)
Albumin/Glob SerPl: 1.7 (ref 0.7–1.7)
Alpha 1: 0.3 g/dL (ref 0.0–0.4)
Alpha2 Glob SerPl Elph-Mcnc: 0.6 g/dL (ref 0.4–1.0)
B-Globulin SerPl Elph-Mcnc: 0.6 g/dL — ABNORMAL LOW (ref 0.7–1.3)
Gamma Glob SerPl Elph-Mcnc: 0.4 g/dL (ref 0.4–1.8)
Globulin, Total: 1.9 g/dL — ABNORMAL LOW (ref 2.2–3.9)
IgA: 33 mg/dL — ABNORMAL LOW (ref 61–437)
IgG (Immunoglobin G), Serum: 200 mg/dL — ABNORMAL LOW (ref 603–1613)
IgM (Immunoglobulin M), Srm: 12 mg/dL — ABNORMAL LOW (ref 15–143)
M Protein SerPl Elph-Mcnc: 0.1 g/dL — ABNORMAL HIGH
Total Protein ELP: 5 g/dL — ABNORMAL LOW (ref 6.0–8.5)

## 2019-05-05 ENCOUNTER — Other Ambulatory Visit (HOSPITAL_COMMUNITY): Payer: Self-pay | Admitting: Nurse Practitioner

## 2019-05-05 DIAGNOSIS — C9000 Multiple myeloma not having achieved remission: Secondary | ICD-10-CM

## 2019-05-09 ENCOUNTER — Other Ambulatory Visit: Payer: Self-pay

## 2019-05-09 ENCOUNTER — Ambulatory Visit (INDEPENDENT_AMBULATORY_CARE_PROVIDER_SITE_OTHER): Payer: Medicare Other | Admitting: Nurse Practitioner

## 2019-05-09 DIAGNOSIS — Z5329 Procedure and treatment not carried out because of patient's decision for other reasons: Secondary | ICD-10-CM

## 2019-05-09 NOTE — Progress Notes (Signed)
erroneous

## 2019-05-10 ENCOUNTER — Telehealth: Payer: Self-pay | Admitting: Family Medicine

## 2019-05-10 ENCOUNTER — Encounter: Payer: Self-pay | Admitting: Family Medicine

## 2019-05-10 ENCOUNTER — Ambulatory Visit: Payer: Medicare Other | Admitting: Nurse Practitioner

## 2019-05-10 ENCOUNTER — Ambulatory Visit (INDEPENDENT_AMBULATORY_CARE_PROVIDER_SITE_OTHER): Payer: Medicare Other | Admitting: Family Medicine

## 2019-05-10 VITALS — BP 110/80 | HR 78 | Temp 96.8°F | Resp 14 | Ht 69.0 in | Wt 197.0 lb

## 2019-05-10 DIAGNOSIS — C9000 Multiple myeloma not having achieved remission: Secondary | ICD-10-CM | POA: Diagnosis not present

## 2019-05-10 DIAGNOSIS — G4700 Insomnia, unspecified: Secondary | ICD-10-CM | POA: Diagnosis not present

## 2019-05-10 DIAGNOSIS — R300 Dysuria: Secondary | ICD-10-CM | POA: Diagnosis not present

## 2019-05-10 LAB — URINALYSIS, ROUTINE W REFLEX MICROSCOPIC
Bacteria, UA: NONE SEEN /HPF
Bilirubin Urine: NEGATIVE
Glucose, UA: NEGATIVE
Hyaline Cast: NONE SEEN /LPF
Leukocytes,Ua: NEGATIVE
Nitrite: NEGATIVE
Protein, ur: NEGATIVE
Specific Gravity, Urine: 1.02 (ref 1.001–1.03)
Squamous Epithelial / HPF: NONE SEEN /HPF (ref <=5)
WBC, UA: NONE SEEN /HPF (ref 0–5)
pH: 7 (ref 5.0–8.0)

## 2019-05-10 LAB — MICROSCOPIC MESSAGE

## 2019-05-10 MED ORDER — FLUTICASONE PROPIONATE 50 MCG/ACT NA SUSP: 2.0000 | Freq: Every day | NASAL | 6 refills | Status: DC

## 2019-05-10 MED ORDER — SULFAMETHOXAZOLE-TRIMETHOPRIM 800-160 MG PO TABS: 1.0000 | ORAL_TABLET | Freq: Two times a day (BID) | ORAL | 0 refills | Status: DC

## 2019-05-10 MED ORDER — ALPRAZOLAM 1 MG PO TABS: 1.0000 mg | ORAL_TABLET | Freq: Three times a day (TID) | ORAL | 3 refills | Status: DC | PRN

## 2019-05-10 NOTE — Telephone Encounter (Signed)
Patient was in today to see dr pickard  He forgot to mention he needed something for his allergies

## 2019-05-10 NOTE — Telephone Encounter (Signed)
Flonase 2 sprays each nostril qday

## 2019-05-10 NOTE — Progress Notes (Signed)
Subjective:    Patient ID: Dean [name]., male    DOB: 07-24-1935, 84 y.o.   MRN: [id number]  HPI Patient reports a 1 week history of dysuria.  He states that it burns every time he pees.  He also reports decreased urinary stream and occasional dribbling at the end of his urinary stream.  Urinalysis today shows +3 blood but otherwise no nitrites and no leukocyte esterase.  Recently, the patient has been falling more frequently.  He states that his legs will just give out on him.  He recently had an MRI of his pelvis, his lumbar spine, and his thoracic spine.  MRI of the pelvis showed: IMPRESSION: 1. Vertical fracture through the left sacral ala consistent with stress fracture or posttraumatic fracture. 2. Subtle transverse fracture through the S3 and S4 segments of the sacrum. 3. Severe osteoarthritis of the right hip. 4. Probable subcutaneous hematoma in the right buttock. 5. Benign prosthetic hypertrophy of the prostate gland with multiple bladder diverticula. Certainly the bladder diverticuli would put him at increased risk for bladder infections.  He would also be at increased risk for prostatitis.  He also had an MRI of the lumbar spine which showed chronic severe spinal stenosis at L3-L4.  I question if this may be causing some of the weakness in his legs and contributing to his falls.  In the past he tried epidural steroid injections agrees with orthopedics consult no benefit.  He denies any symptoms of cauda equina syndrome at present.  He refuses surgical intervention at this point given everything that he is going through.  He states that he can live with the pain for the time being.  Past Medical History:  Diagnosis Date  . BPH (benign prostatic hyperplasia)   . Colonic diverticular abscess 01/08/2015  . Diverticulitis 95/97/47   complicated by abscess and required percutaneous drainage  . GERD (gastroesophageal reflux disease)   . H/O ETOH abuse   . History of chemotherapy  last done jan 2017  . History of radiation therapy 01/05/13-02/10/13   45 gray to left occipital condyle region  . History of radiation therapy 12/01/16-12/10/16   Parasternal nodule, chest- 24 Gy total delivered in 8 fractions, Left sacro-iliac, pelvis- 24 Gy total delivered in 8 fractions   . History of radiation therapy 02/19/17-02/22/17   right temporal scalp 30 Gy in 10 fractions  . Intra-abdominal abscess ([place])   . Multiple myeloma ([place]) 2015  . Neuropathy   . Radiation 02/21/14-03/08/14   right posterior chest wall area 30 gray  . Radiation 04/19/14-05/02/14   lumbar spine 25 gray  . Skull lesion    Left occipital condyle  . Wrist fracture, left    x 2   Past Surgical History:  Procedure Laterality Date  . COLONOSCOPY N/A 04/19/2015   Procedure: COLONOSCOPY;  Surgeon: Danie Binder, MD;  Location: [facility];  Service: Endoscopy;  Laterality: N/A;  1:30 PM  . CYST EXCISION  1959   tail bone  . IR IMAGING GUIDED PORT INSERTION  09/25/2017   Current Outpatient Medications on File Prior to Visit  Medication Sig Dispense Refill  . acyclovir (ZOVIRAX) 400 MG tablet TAKE 1 TABLET BY MOUTH 2 TIMES DAILY 60 tablet 3  . ALPRAZolam (XANAX) 1 MG tablet TAKE 2 TABLETS BY MOUTH AT BEDTIME AS NEEDED FOR ANXIETY OR SLEEP 60 tablet 3  . amoxicillin-clavulanate (AUGMENTIN) 875-125 MG tablet Take 1 tablet by mouth 2 (two) times daily. 20 tablet 0  .  azithromycin (ZITHROMAX) 250 MG tablet Take 2 tablets (548m) Day 1 and 1 tablet (2576m Day 2-5. 6 each 0  . CALCIUM-VITAMIN D PO Take 1 tablet by mouth 2 (two) times daily.     . Marland Kitchenexamethasone (DECADRON) 4 MG tablet Take 2.5 tablets (10 mg total) by mouth once a week. Take 10 mg once weekly 10 tablet 12  . diclofenac Sodium (VOLTAREN) 1 % GEL Apply topically as needed. Pt has not picked up yet    . furosemide (LASIX) 20 MG tablet TAKE 1 TABLET BY MOUTH DAILY AS NEEDED FOR EDEMA. 30 tablet 0  . gabapentin (NEURONTIN) 400 MG capsule TAKE 1 CAPSULE BY  MOUTH 3 TIMES DAILY 90 capsule 4  . Homeopathic Products (THNeabscoApply topically as needed.    . lidocaine-prilocaine (EMLA) cream Apply small amount over port one (1) hour prior to appointment. 30 g 0  . MAGNESIUM PO Take 1 tablet by mouth at bedtime.    . montelukast (SINGULAIR) 10 MG tablet Take 1 tablet (10 mg total) by mouth at bedtime. 30 tablet 3  . omeprazole (PRILOSEC) 20 MG capsule TAKE 1 CAPSULE BY MOUTH ONCE DAILY 30 capsule 5  . Oxycodone HCl 10 MG TABS TAKE 1 TABLET BY MOUTH EVERY 8 HOURS AS NEEDED FOR PAIN 120 tablet 0  . senna-docusate (SENOKOT-S) 8.6-50 MG tablet Take 1 tablet by mouth 2 (two) times daily. 60 tablet 5  . Suvorexant (BELSOMRA) 10 MG TABS Take 10 mg by mouth at bedtime as needed. 30 tablet 3  . tamsulosin (FLOMAX) 0.4 MG CAPS capsule TAKE ONE CAPSULE BY MOUTH EVERY NIGHT ATBEDTIME 30 capsule 11  . tiZANidine (ZANAFLEX) 4 MG tablet Take 1 tablet (4 mg total) by mouth every 6 (six) hours as needed (headache). 30 tablet 0  . traZODone (DESYREL) 100 MG tablet Take 1 tablet (100 mg total) by mouth at bedtime. May titrate up to 300 mg nightly. 60 tablet 3  . zolendronic acid (ZOMETA) 4 MG/5ML injection Inject 4 mg into the vein every 30 (thirty) days.      No current facility-administered medications on file prior to visit.   Allergies  Allergen Reactions  . Codeine Other (See Comments)    Headache  . Morphine And Related Other (See Comments)    Makes him feel weird  . Revlimid [Lenalidomide] Other (See Comments)    "Causes me to become weak"   Social History   Socioeconomic History  . Marital status: Married    Spouse name: Not on file  . Number of children: 3  . Years of education: Not on file  . Highest education level: Not on file  Occupational History    Employer: RETIRED  Tobacco Use  . Smoking status: Never Smoker  . Smokeless tobacco: Never Used  Substance and Sexual Activity  . Alcohol use: No    Comment: " Not much no more"  .  Drug use: No  . Sexual activity: Not on file  Other Topics Concern  . Not on file  Social History Narrative  . Not on file   Social Determinants of Health   Financial Resource Strain:   . Difficulty of Paying Living Expenses:   Food Insecurity:   . Worried About RuCharity fundraisern the Last Year:   . RaArboriculturistn the Last Year:   Transportation Needs:   . LaFilm/video editorMedical):   . Marland Kitchenack of Transportation (Non-Medical):   Physical Activity:   .  Days of Exercise per Week:   . Minutes of Exercise per Session:   Stress:   . Feeling of Stress :   Social Connections:   . Frequency of Communication with Friends and Family:   . Frequency of Social Gatherings with Friends and Family:   . Attends Religious Services:   . Active Member of Clubs or Organizations:   . Attends Archivist Meetings:   Marland Kitchen Marital Status:   Intimate Partner Violence:   . Fear of Current or Ex-Partner:   . Emotionally Abused:   Marland Kitchen Physically Abused:   . Sexually Abused:       Review of Systems     Objective:   Physical Exam Vitals reviewed.  Constitutional:      General: He is not in acute distress.    Appearance: He is obese. He is not ill-appearing or toxic-appearing.  Cardiovascular:     Rate and Rhythm: Normal rate and regular rhythm.     Heart sounds: Normal heart sounds.  Pulmonary:     Effort: Pulmonary effort is normal. No respiratory distress.     Breath sounds: Normal breath sounds. No wheezing, rhonchi or rales.  Neurological:     General: No focal deficit present.     Mental Status: He is alert and oriented to person, place, and time.     Cranial Nerves: No cranial nerve deficit.     Motor: No weakness.           Assessment & Plan:  Burning with urination - Plan: Urinalysis, Routine w reflex microscopic  Insomnia, unspecified type - Plan: ALPRAZolam (XANAX) 1 MG tablet  Multiple myeloma, remission status unspecified (HCC) - Plan: ALPRAZolam  (XANAX) 1 MG tablet  I suspect the prostate gland infection/prostatitis.  I will treat the patient empirically with Bactrim double strength tablets 1 p.o. twice daily for 14 days.  Recheck in 1 week if no better or sooner if worse.  Patient declines rectal exam today due to discomfort from his recent fall.  I am concerned that the weakness in his legs is multifactorial and some likely related to age, some likely related to chemotherapy for his multiple myeloma, and some likely related to his moderate to severe spinal stenosis.  However the patient be a poor surgical candidate given his chemotherapy and his ongoing cancer treatment at the present time he has no desire to see neurosurgeon.  I did refill his Xanax which he uses sparingly to help him sleep at night.  I did caution the patient about the increased risk of falls on benzodiazepines

## 2019-05-10 NOTE — Telephone Encounter (Signed)
Pt aware and med sent to pharm 

## 2019-05-31 ENCOUNTER — Inpatient Hospital Stay (HOSPITAL_COMMUNITY): Payer: Medicare Other | Attending: Hematology | Admitting: Hematology

## 2019-05-31 ENCOUNTER — Other Ambulatory Visit (HOSPITAL_COMMUNITY): Payer: Self-pay | Admitting: *Deleted

## 2019-05-31 ENCOUNTER — Inpatient Hospital Stay (HOSPITAL_COMMUNITY): Payer: Medicare Other

## 2019-05-31 ENCOUNTER — Other Ambulatory Visit: Payer: Self-pay

## 2019-05-31 ENCOUNTER — Encounter (HOSPITAL_COMMUNITY): Payer: Self-pay | Admitting: Hematology

## 2019-05-31 VITALS — BP 119/63 | HR 88 | Temp 97.1°F | Resp 18

## 2019-05-31 DIAGNOSIS — C9000 Multiple myeloma not having achieved remission: Secondary | ICD-10-CM

## 2019-05-31 LAB — CBC WITH DIFFERENTIAL/PLATELET
Abs Immature Granulocytes: 0.01 10*3/uL (ref 0.00–0.07)
Basophils Absolute: 0 10*3/uL (ref 0.0–0.1)
Basophils Relative: 0 %
Eosinophils Absolute: 0 10*3/uL (ref 0.0–0.5)
Eosinophils Relative: 0 %
HCT: 43.5 % (ref 39.0–52.0)
Hemoglobin: 14.1 g/dL (ref 13.0–17.0)
Immature Granulocytes: 0 %
Lymphocytes Relative: 27 %
Lymphs Abs: 1.2 10*3/uL (ref 0.7–4.0)
MCH: 31.4 pg (ref 26.0–34.0)
MCHC: 32.4 g/dL (ref 30.0–36.0)
MCV: 96.9 fL (ref 80.0–100.0)
Monocytes Absolute: 0.4 10*3/uL (ref 0.1–1.0)
Monocytes Relative: 8 %
Neutro Abs: 2.9 10*3/uL (ref 1.7–7.7)
Neutrophils Relative %: 65 %
Platelets: 136 10*3/uL — ABNORMAL LOW (ref 150–400)
RBC: 4.49 MIL/uL (ref 4.22–5.81)
RDW: 14.6 % (ref 11.5–15.5)
WBC: 4.5 10*3/uL (ref 4.0–10.5)
nRBC: 0 % (ref 0.0–0.2)

## 2019-05-31 LAB — COMPREHENSIVE METABOLIC PANEL
ALT: 14 U/L (ref 0–44)
AST: 16 U/L (ref 15–41)
Albumin: 3.6 g/dL (ref 3.5–5.0)
Alkaline Phosphatase: 21 U/L — ABNORMAL LOW (ref 38–126)
Anion gap: 11 (ref 5–15)
BUN: 20 mg/dL (ref 8–23)
CO2: 22 mmol/L (ref 22–32)
Calcium: 8.8 mg/dL — ABNORMAL LOW (ref 8.9–10.3)
Chloride: 106 mmol/L (ref 98–111)
Creatinine, Ser: 0.62 mg/dL (ref 0.61–1.24)
GFR calc Af Amer: >60 mL/min (ref >60)
GFR calc non Af Amer: >60 mL/min (ref >60)
Glucose, Bld: 106 mg/dL — ABNORMAL HIGH (ref 70–99)
Potassium: 4.2 mmol/L (ref 3.5–5.1)
Sodium: 139 mmol/L (ref 135–145)
Total Bilirubin: 0.6 mg/dL (ref 0.3–1.2)
Total Protein: 5.5 g/dL — ABNORMAL LOW (ref 6.5–8.1)

## 2019-05-31 LAB — MAGNESIUM: Magnesium: 2 mg/dL (ref 1.7–2.4)

## 2019-05-31 LAB — LACTATE DEHYDROGENASE: LDH: 151 U/L (ref 98–192)

## 2019-05-31 MED ORDER — ZOLEDRONIC ACID 4 MG/100ML IV SOLN
INTRAVENOUS | Status: AC
  Filled 2019-05-31: qty 100

## 2019-05-31 MED ORDER — ZOLEDRONIC ACID 4 MG/100ML IV SOLN
4.0000 mg | Freq: Once | INTRAVENOUS | Status: AC
  Administered 2019-05-31: 13:00:00 4 mg via INTRAVENOUS

## 2019-05-31 MED ORDER — SODIUM CHLORIDE 0.9 % IV SOLN: Freq: Once | INTRAVENOUS | Status: AC

## 2019-05-31 MED ORDER — HEPARIN SOD (PORK) LOCK FLUSH 100 UNIT/ML IV SOLN
500.0000 [IU] | Freq: Once | INTRAVENOUS | Status: AC | PRN
  Administered 2019-05-31: 13:00:00 500 [IU]

## 2019-05-31 MED ORDER — SODIUM CHLORIDE 0.9% FLUSH
10.0000 mL | Freq: Once | INTRAVENOUS | Status: AC | PRN
  Administered 2019-05-31: 10 mL

## 2019-05-31 NOTE — Patient Instructions (Signed)
[facility] at [facility] Discharge Instructions  You were seen today by Dr. Katragadda. He went over your recent lab results. He will see you back in 4 weeks for labs, treatment and follow up.   Thank you for choosing [facility] at [facility] to provide your oncology and hematology care.  To afford each patient quality time with our provider, please arrive at least 15 minutes before your scheduled appointment time.   If you have a lab appointment with the Cancer Center please come in thru the  Main Entrance and check in at the main information desk  You need to re-schedule your appointment should you arrive 10 or more minutes late.  We strive to give you quality time with our providers, and arriving late affects you and other patients whose appointments are after yours.  Also, if you no show three or more times for appointments you may be dismissed from the clinic at the providers discretion.     Again, thank you for choosing [facility].  Our hope is that these requests will decrease the amount of time that you wait before being seen by our physicians.       _____________________________________________________________  Should you have questions after your visit to [facility], please contact our office at [phone] between the hours of 8:00 a.m. and 4:30 p.m.  Voicemails left after 4:00 p.m. will not be returned until the following business day.  For prescription refill requests, have your pharmacy contact our office and allow 72 hours.    Cancer Center Support Programs:   > Cancer Support Group  2nd Tuesday of the month 1pm-2pm, Journey Room    

## 2019-05-31 NOTE — Progress Notes (Signed)
Patient has been assessed, vital signs and labs have been reviewed by Dr. Katragadda. ANC, Creatinine, LFTs, and Platelets are within treatment parameters per Dr. Katragadda. The patient is good to proceed with treatment at this time.  

## 2019-05-31 NOTE — Progress Notes (Signed)
Dean [name], Dean [name]   CLINIC:  Medical Oncology/Hematology  PCP:  [name], MD [address] [place] [address] [phone]   REASON FOR VISIT:  Follow-up for  IgG kappa multiple myeloma   BRIEF ONCOLOGIC HISTORY:  Oncology History  Multiple myeloma without remission ([place])  11/16/2012 Initial Diagnosis   L occipital condyle destructive lesion, not biopsied secondary to location. XRT   12/01/2012 Imaging   L3 superior endplate compression FX, no lytic lesions to suggest myeloma   02/06/2014 Imaging   soft tissue mass along the right posterior fifth rib with some rib destruction   02/16/2014 Miscellaneous   Normal CBC, Normal CMP, kappa,lamda ratio of 2.62 (abnormal), UIEP with slightly restricted band, IgG kappa, SPEP/IEP with monoclonal protein at 0.79 g/dl, normal igG, suppressed IGM   02/20/2014 Bone Marrow Biopsy   33% kappa restricted plasma cells Normal FISH, normal cytogenetics   02/21/2014 - 03/08/2014 Radiation Therapy   30Gy to R rib lesion, lesion not biopsied   03/16/2014 PET scan   Scattered hypermetabolic osseous lesions, including the calvarium, ribs, left scapula, sacrum, and left femoral shaft. An expansile left vertebral body lesion at L3 extends into the epidural space of the left lateral recess,    03/17/2014 - 04/07/2014 Chemotherapy   Velcade and Dexamethasone due to initial denial of Revlimid by insurance company.  Zometa monthly.   03/22/2014 Imaging   MR_ L-Spine- Enhancing lesions compatible with multiple myeloma at L2, L3, and S1-2.   04/07/2014 - 07/28/2014 Chemotherapy   RVD with Revlimid at 25 mg days 1-14.  Revlimid dose reduced to 25 mg every other day x 14 days with 7 day respite beginning on day 1 of cycle 2.   04/17/2014 Adverse Reaction   Revlimid-induced rash.  Treated with steroids.   07/28/2014 - 08/04/2014 Chemotherapy   Revlimid daily   08/04/2014 Adverse Reaction   Velcade-induced peripheral neuropathy   08/04/2014 Treatment Plan Change   D/C Velcade   08/11/2014 PET scan   Response to therapy. Improvement and resolution of foci of osseous hypermetabolism.   08/22/2014 - 08/28/2014 Chemotherapy   Revlimid 25 mg days 1-21 every 28 days   08/28/2014 Adverse Reaction   Revlimid-induced rash. Medication held.  Medrol dose Pak prescribed.   09/04/2014 - 01/08/2015 Chemotherapy   Revlimid 10 mg every other day, without dexamethasone   01/08/2015 - 01/15/2015 Hospital Admission   Colonic diverticular abscess with IR drain placement   03/02/2015 PET scan   Only bony uptake is low activity at site of deformity/callus at R second rib FX, high activity in sigmoid colon, advanced sigmoid divertidulosis. Abscess not resolved?   06/04/2015 Imaging   CT nonobstructive L renal calculus, diverticulosis of descending and sigmoid colon without inflammation, moderate prostatic enlargement   07/12/2015 Surgery   Diverticulitis s/p robotic sigmoid colectomy with Dr. Johney Maine   08/23/2015 PET scan   Primarily similar hypermetabolic osseous foci within the R sided ribs. new L third rib focus of hypermetab and sclerosis is favored to be related to healing FX. no soft tissue myeloma id'd. Presumed postop hypermetab and edema about the R pelvic wall    03/06/2016 PET scan   1. Reduced activity in the prior lesion such as the right second and fifth rib lesions, activity nearly completely resolved and significantly less than added mediastinal blood pool activity. No new lesions are identified. 2. Other imaging findings of potential clinical significance: Coronary, aortic  arch, and branch vessel atherosclerotic vascular disease. Aortoiliac atherosclerotic vascular disease. Enlarged prostate gland. Colonic diverticula.   02/17/2017 PET scan   HEAD/NECK: No hypermetabolic activity in the scalp. No hypermetabolic cervical lymph nodes. CHEST: No hypermetabolic mediastinal or hilar nodes.  Right upper lobe scarring/atelectasis. No suspicious pulmonary nodules on the CT scan. ABDOMEN/PELVIS: No abnormal hypermetabolic activity within the liver, pancreas, adrenal glands, or spleen. No hypermetabolic lymph nodes in the abdomen or pelvis. Atherosclerotic calcifications of the abdominal aorta and branch vessels. Left renal sinus cysts. Sigmoid diverticulosis, without evidence of diverticulitis. Prostatomegaly. SKELETON: Vague hypermetabolism involving the inferior sternum, max SUV 3.2, previously 8.2. Focal hypermetabolism involving the left sacrum, max SUV 4.0, previously 6.7. EXTREMITIES: No abnormal hypermetabolic activity in the lower extremities.   02/18/2017 -  Chemotherapy   Bortezomib 1.'3mg'$ /m2 QWk + Dexamethasone '10mg'$  QTue/Fri --Cycle #1, 02/18/17    02/18/2017 - 08/26/2017 Chemotherapy   The patient had bortezomib SQ (VELCADE) chemo injection 3 mg, 1.3 mg/m2 = 3 mg, Subcutaneous,  Once, 7 of 9 cycles Administration: 3 mg (02/18/2017), 3 mg (02/26/2017), 3 mg (03/04/2017), 3 mg (03/11/2017), 3 mg (04/08/2017), 3 mg (03/18/2017), 3 mg (03/25/2017), 3 mg (04/01/2017), 3 mg (05/06/2017), 3 mg (05/20/2017), 3 mg (06/03/2017), 3 mg (06/17/2017), 3 mg (07/01/2017), 3 mg (07/15/2017), 3 mg (07/29/2017), 3 mg (08/12/2017), 3 mg (08/26/2017)  for chemotherapy treatment.    09/17/2017 -  Chemotherapy   The patient had daratumumab-hyaluronidase-fihj (DARZALEX FASPRO) 1800-30000 MG-UT/15ML chemo SQ injection 1,800 mg, 1,800 mg, Subcutaneous,  Once, 6 of 9 cycles Administration: 1,800 mg (10/25/2018), 1,800 mg (11/22/2018), 1,800 mg (12/20/2018), 1,800 mg (01/17/2019), 1,800 mg (02/16/2019), 1,800 mg (03/16/2019) daratumumab (DARZALEX) 1,800 mg in sodium chloride 0.9 % 910 mL (1.8 mg/mL) chemo infusion, 15.9 mg/kg = 1,820 mg, Intravenous, Once, 1 of 1 cycle Administration: 1,800 mg (09/17/2017) daratumumab (DARZALEX) 1,800 mg in sodium chloride 0.9 % 410 mL (3.6 mg/mL) chemo infusion, 1,820 mg, Intravenous, Once, 13 of  13 cycles Administration: 1,800 mg (10/28/2017), 1,800 mg (11/04/2017), 1,800 mg (11/11/2017), 1,800 mg (11/18/2017), 1,800 mg (11/25/2017), 1,800 mg (12/02/2017), 1,800 mg (12/09/2017), 1,800 mg (12/16/2017), 1,800 mg (12/30/2017), 1,800 mg (04/26/2018), 1,800 mg (01/13/2018), 1,800 mg (02/12/2018), 1,800 mg (02/26/2018), 1,800 mg (03/15/2018), 1,800 mg (03/29/2018), 1,800 mg (04/12/2018), 1,800 mg (05/10/2018), 1,800 mg (06/07/2018), 1,800 mg (07/05/2018), 1,800 mg (08/02/2018), 1,800 mg (08/30/2018), 1,800 mg (09/27/2018)  for chemotherapy treatment.       CANCER STAGING: Cancer Staging No matching staging information was found for the patient.   INTERVAL HISTORY:  Mr. Maye 83 y.o. male seen for follow-up of multiple myeloma and back pain.  Appetite and energy levels are 25%.  Pain is fairly controlled with oxycodone.  Reports some constipation.  Reportedly fell few times in the backyard while he was lifting up some pieces of food.  Denies any major injuries.  No new onset pains reported.   REVIEW OF SYSTEMS:  Review of Systems  Gastrointestinal: Positive for constipation.  Musculoskeletal: Positive for back pain.  Neurological: Positive for numbness.  All other systems reviewed and are negative.    PAST MEDICAL/SURGICAL HISTORY:  Past Medical History:  Diagnosis Date  . BPH (benign prostatic hyperplasia)   . Colonic diverticular abscess 01/08/2015  . Diverticulitis 50/09/38   complicated by abscess and required percutaneous drainage  . GERD (gastroesophageal reflux disease)   . H/O ETOH abuse   . History of chemotherapy last done jan 2017  . History of radiation therapy 01/05/13-02/10/13   45 gray to left  occipital condyle region  . History of radiation therapy 12/01/16-12/10/16   Parasternal nodule, chest- 24 Gy total delivered in 8 fractions, Left sacro-iliac, pelvis- 24 Gy total delivered in 8 fractions   . History of radiation therapy 02/19/17-02/22/17   right temporal scalp 30 Gy in 10  fractions  . Intra-abdominal abscess ([place])   . Multiple myeloma ([place]) 2015  . Neuropathy   . Radiation 02/21/14-03/08/14   right posterior chest wall area 30 gray  . Radiation 04/19/14-05/02/14   lumbar spine 25 gray  . Skull lesion    Left occipital condyle  . Wrist fracture, left    x 2   Past Surgical History:  Procedure Laterality Date  . COLONOSCOPY N/A 04/19/2015   Procedure: COLONOSCOPY;  Surgeon: Danie Binder, MD;  Location: [facility];  Service: Endoscopy;  Laterality: N/A;  1:30 PM  . CYST EXCISION  1959   tail bone  . IR IMAGING GUIDED PORT INSERTION  09/25/2017     SOCIAL HISTORY:  Social History   Socioeconomic History  . Marital status: Married    Spouse name: Not on file  . Number of children: 3  . Years of education: Not on file  . Highest education level: Not on file  Occupational History    Employer: RETIRED  Tobacco Use  . Smoking status: Never Smoker  . Smokeless tobacco: Never Used  Substance and Sexual Activity  . Alcohol use: No    Comment: " Not much no more"  . Drug use: No  . Sexual activity: Not on file  Other Topics Concern  . Not on file  Social History Narrative  . Not on file   Social Determinants of Health   Financial Resource Strain:   . Difficulty of Paying Living Expenses:   Food Insecurity:   . Worried About Charity fundraiser in the Last Year:   . Arboriculturist in the Last Year:   Transportation Needs:   . Film/video editor (Medical):   Marland Kitchen Lack of Transportation (Non-Medical):   Physical Activity:   . Days of Exercise per Week:   . Minutes of Exercise per Session:   Stress:   . Feeling of Stress :   Social Connections:   . Frequency of Communication with Friends and Family:   . Frequency of Social Gatherings with Friends and Family:   . Attends Religious Services:   . Active Member of Clubs or Organizations:   . Attends Archivist Meetings:   Marland Kitchen Marital Status:   Intimate Partner Violence:   .  Fear of Current or Ex-Partner:   . Emotionally Abused:   Marland Kitchen Physically Abused:   . Sexually Abused:     FAMILY HISTORY:  Family History  Problem Relation Age of Onset  . Diabetes Mother   . Stroke Mother   . Hypertension Father     CURRENT MEDICATIONS:  Outpatient Encounter Medications as of 05/31/2019  Medication Sig Note  . acyclovir (ZOVIRAX) 400 MG tablet TAKE 1 TABLET BY MOUTH 2 TIMES DAILY   . ALPRAZolam (XANAX) 1 MG tablet Take 1 tablet (1 mg total) by mouth 3 (three) times daily as needed for anxiety.   Marland Kitchen CALCIUM-VITAMIN D PO Take 1 tablet by mouth 2 (two) times daily.    Marland Kitchen dexamethasone (DECADRON) 4 MG tablet Take 2.5 tablets (10 mg total) by mouth once a week. Take 10 mg once weekly 09/18/2017: Started on 09/18/17  . diclofenac Sodium (VOLTAREN) 1 %  GEL Apply topically as needed. Pt has not picked up yet   . fluticasone (FLONASE) 50 MCG/ACT nasal spray Place 2 sprays into both nostrils daily.   . furosemide (LASIX) 20 MG tablet TAKE 1 TABLET BY MOUTH DAILY AS NEEDED FOR EDEMA.   Marland Kitchen gabapentin (NEURONTIN) 400 MG capsule TAKE 1 CAPSULE BY MOUTH 3 TIMES DAILY   . Homeopathic Products ([place]) Apply topically as needed.   . lidocaine-prilocaine (EMLA) cream Apply small amount over port one (1) hour prior to appointment.   Marland Kitchen MAGNESIUM PO Take 1 tablet by mouth at bedtime.   . montelukast (SINGULAIR) 10 MG tablet Take 1 tablet (10 mg total) by mouth at bedtime.   Marland Kitchen omeprazole (PRILOSEC) 20 MG capsule TAKE 1 CAPSULE BY MOUTH ONCE DAILY   . Oxycodone HCl 10 MG TABS TAKE 1 TABLET BY MOUTH EVERY 8 HOURS AS NEEDED FOR PAIN   . senna-docusate (SENOKOT-S) 8.6-50 MG tablet Take 1 tablet by mouth 2 (two) times daily.   Marland Kitchen sulfamethoxazole-trimethoprim (BACTRIM DS) 800-160 MG tablet Take 1 tablet by mouth 2 (two) times daily.   . Suvorexant (BELSOMRA) 10 MG TABS Take 10 mg by mouth at bedtime as needed.   . tamsulosin (FLOMAX) 0.4 MG CAPS capsule TAKE ONE CAPSULE BY MOUTH EVERY  NIGHT ATBEDTIME   . tiZANidine (ZANAFLEX) 4 MG tablet Take 1 tablet (4 mg total) by mouth every 6 (six) hours as needed (headache).   . traZODone (DESYREL) 100 MG tablet Take 1 tablet (100 mg total) by mouth at bedtime. May titrate up to 300 mg nightly.   . zolendronic acid (ZOMETA) 4 MG/5ML injection Inject 4 mg into the vein every 30 (thirty) days.     No facility-administered encounter medications on file as of 05/31/2019.    ALLERGIES:  Allergies  Allergen Reactions  . Codeine Other (See Comments)    Headache  . Morphine And Related Other (See Comments)    Makes him feel weird  . Revlimid [Lenalidomide] Other (See Comments)    "Causes me to become weak"     PHYSICAL EXAM:  ECOG Performance status: 1  Vitals:   05/31/19 1100  BP: 133/75  Pulse: 89  Resp: 18  Temp: (!) 97.1 F (36.2 C)  SpO2: 100%   Filed Weights   05/31/19 1100  Weight: 199 lb 8 oz (90.5 kg)    Physical Exam Vitals reviewed.  Constitutional:      Appearance: Normal appearance.  Cardiovascular:     Rate and Rhythm: Normal rate and regular rhythm.     Heart sounds: Normal heart sounds.  Pulmonary:     Effort: Pulmonary effort is normal.     Breath sounds: Normal breath sounds.  Abdominal:     General: There is no distension.     Palpations: Abdomen is soft. There is no mass.  Musculoskeletal:        General: No swelling.  Skin:    General: Skin is warm.  Neurological:     General: No focal deficit present.     Mental Status: He is alert and oriented to person, place, and time.  Psychiatric:        Mood and Affect: Mood normal.        Behavior: Behavior normal.      LABORATORY DATA:  I have reviewed the labs as listed.  CBC    Component Value Date/Time   WBC 4.5 05/31/2019 1006   RBC 4.49 05/31/2019 1006   HGB 14.1  05/31/2019 1006   HCT 43.5 05/31/2019 1006   PLT 136 (L) 05/31/2019 1006   MCV 96.9 05/31/2019 1006   MCH 31.4 05/31/2019 1006   MCHC 32.4 05/31/2019 1006   RDW  14.6 05/31/2019 1006   LYMPHSABS 1.2 05/31/2019 1006   MONOABS 0.4 05/31/2019 1006   EOSABS 0.0 05/31/2019 1006   BASOSABS 0.0 05/31/2019 1006   CMP Latest Ref Rng & Units 05/31/2019 04/28/2019 04/14/2019  Glucose 70 - 99 mg/dL 106(H) 90 89  BUN 8 - 23 mg/dL '20 20 19  '$ Creatinine 0.61 - 1.24 mg/dL 0.62 0.79 0.76  Sodium 135 - 145 mmol/L 139 139 137  Potassium 3.5 - 5.1 mmol/L 4.2 3.1(L) 3.7  Chloride 98 - 111 mmol/L 106 102 105  CO2 22 - 32 mmol/L '22 26 25  '$ Calcium 8.9 - 10.3 mg/dL 8.8(L) 8.6(L) 8.0(L)  Total Protein 6.5 - 8.1 g/dL 5.5(L) 5.2(L) 5.2(L)  Total Bilirubin 0.3 - 1.2 mg/dL 0.6 1.2 1.0  Alkaline Phos 38 - 126 U/L 21(L) 20(L) 18(L)  AST 15 - 41 U/L '16 24 17  '$ ALT 0 - 44 U/L '14 19 13       '$ DIAGNOSTIC IMAGING:  I have independently reviewed scans.   I have reviewed Venita Lick LPN's note and agree with the documentation.  I personally performed a face-to-face visit, made revisions and my assessment and plan is as follows.    ASSESSMENT & PLAN:   Multiple myeloma without remission ([place]) 1.  Recurrent IgG kappa multiple myeloma: -Daratumumab on hold since 03/16/2019 secondary to weakness. -MRI of the thoracic spine showed no myeloma lesions.  Right first rib unchanged.  Left rib lesion has improved.  Left sacral ala vertical fracture present.  Spinal stenosis at L3-L4.  Compression fracture of L2 and L4. -We reviewed myeloma panel from 04/28/2019.  M spike has further improved to 0.1 g from 0.3 g previously.  Immunofixation was positive for IgG kappa.  Free light chain ratio has gone up slightly to 4.03 from 3.79.  Kappa light chains have gone up to 14.9 from 12.9. -He reportedly fell few times in the backyard when he was trying to lift up some pieces of wood.  I will continue to hold his daratumumab.  We will see him back in 4 weeks for follow-up.  2.  Low back pain: -He is taking oxycodone 10 mg 3 times a day.  Pain is reasonably well controlled.  3.  Bone  protection: -Calcium today is normal.  Creatinine is 0.62.  He will receive Zometa.  4.  Peripheral neuropathy: -He will continue gabapentin 3 times a day.  5.  Is sleeping difficulty: -He will continue trazodone along with Xanax.  6.  Hypokalemia: -Potassium is 4.2 today.  He will continue potassium supplements at home.     Orders placed this encounter:  No orders of the defined types were placed in this encounter.     Derek Jack, MD [place] [phone]

## 2019-05-31 NOTE — Progress Notes (Signed)
To treatment room for IV Zometa verbal order Dr. Delton Coombes.    Patient tolerated Zometa infusion with no complaints voiced.  Blood return noted before and after infusion.   Port clean and dry with no bruising or swelling noted.  Band aid applied.  VSs with discharge and left by wheelchair with no s/s of distress noted.

## 2019-06-01 LAB — PROTEIN ELECTROPHORESIS, SERUM
A/G Ratio: 1.6 (ref 0.7–1.7)
Albumin ELP: 3.2 g/dL (ref 2.9–4.4)
Alpha-1-Globulin: 0.3 g/dL (ref 0.0–0.4)
Alpha-2-Globulin: 0.7 g/dL (ref 0.4–1.0)
Beta Globulin: 0.8 g/dL (ref 0.7–1.3)
Gamma Globulin: 0.2 g/dL — ABNORMAL LOW (ref 0.4–1.8)
Globulin, Total: 2 g/dL — ABNORMAL LOW (ref 2.2–3.9)
M-Spike, %: 0.1 g/dL — ABNORMAL HIGH
Total Protein ELP: 5.2 g/dL — ABNORMAL LOW (ref 6.0–8.5)

## 2019-06-01 LAB — KAPPA/LAMBDA LIGHT CHAINS
Kappa free light chain: 14.4 mg/L (ref 3.3–19.4)
Kappa, lambda light chain ratio: 4.8 — ABNORMAL HIGH (ref 0.26–1.65)
Lambda free light chains: 3 mg/L — ABNORMAL LOW (ref 5.7–26.3)

## 2019-06-04 NOTE — Assessment & Plan Note (Signed)
1.  Recurrent IgG kappa multiple myeloma: -Daratumumab on hold since 03/16/2019 secondary to weakness. -MRI of the thoracic spine showed no myeloma lesions.  Right first rib unchanged.  Left rib lesion has improved.  Left sacral ala vertical fracture present.  Spinal stenosis at L3-L4.  Compression fracture of L2 and L4. -We reviewed myeloma panel from 04/28/2019.  M spike has further improved to 0.1 g from 0.3 g previously.  Immunofixation was positive for IgG kappa.  Free light chain ratio has gone up slightly to 4.03 from 3.79.  Kappa light chains have gone up to 14.9 from 12.9. -He reportedly fell few times in the backyard when he was trying to lift up some pieces of wood.  I will continue to hold his daratumumab.  We will see him back in 4 weeks for follow-up.  2.  Low back pain: -He is taking oxycodone 10 mg 3 times a day.  Pain is reasonably well controlled.  3.  Bone protection: -Calcium today is normal.  Creatinine is 0.62.  He will receive Zometa.  4.  Peripheral neuropathy: -He will continue gabapentin 3 times a day.  5.  Is sleeping difficulty: -He will continue trazodone along with Xanax.  6.  Hypokalemia: -Potassium is 4.2 today.  He will continue potassium supplements at home.

## 2019-06-16 ENCOUNTER — Encounter: Payer: Self-pay | Admitting: Family Medicine

## 2019-06-16 ENCOUNTER — Ambulatory Visit (INDEPENDENT_AMBULATORY_CARE_PROVIDER_SITE_OTHER): Payer: Medicare Other | Admitting: Family Medicine

## 2019-06-16 VITALS — BP 100/60 | HR 78 | Temp 96.8°F | Resp 16 | Ht 69.0 in | Wt 208.0 lb

## 2019-06-16 DIAGNOSIS — R300 Dysuria: Secondary | ICD-10-CM | POA: Diagnosis not present

## 2019-06-16 DIAGNOSIS — G4700 Insomnia, unspecified: Secondary | ICD-10-CM | POA: Diagnosis not present

## 2019-06-16 LAB — URINALYSIS, ROUTINE W REFLEX MICROSCOPIC
Bilirubin Urine: NEGATIVE
Glucose, UA: NEGATIVE
Hgb urine dipstick: NEGATIVE
Ketones, ur: NEGATIVE
Leukocytes,Ua: NEGATIVE
Nitrite: NEGATIVE
Protein, ur: NEGATIVE
Specific Gravity, Urine: 1.02 (ref 1.001–1.03)
pH: 7 (ref 5.0–8.0)

## 2019-06-16 MED ORDER — SULFAMETHOXAZOLE-TRIMETHOPRIM 800-160 MG PO TABS: 1.0000 | ORAL_TABLET | Freq: Two times a day (BID) | ORAL | 0 refills | Status: DC

## 2019-06-16 NOTE — Progress Notes (Signed)
Subjective:    Patient ID: Dean [name]., male    DOB: 06-16-1935, 84 y.o.   MRN: [id number]  HPI Please see the patient's office visit from April 6.  At that time I treated him with 2 weeks of Bactrim for possible prostatitis.  He states that his symptoms improved approximately 90% but never completely subsided.  Since stopping antibiotic however the dysuria has returned.  He reports frequency and urgency.  He denies any hematuria.  Urinalysis today shows no blood, no leukocyte esterase, no nitrites.  He also reports insomnia.  However he took 2 Xanax and gabapentin and was able to sleep last night.  If he just takes the Xanax, he can only sleep 2 or 3 hours and then he awakens for the rest of the night.  This keeps him from resting.  He is battling multiple myeloma.  He questions if it would be okay to take Xanax with gabapentin. Past Medical History:  Diagnosis Date  . BPH (benign prostatic hyperplasia)   . Colonic diverticular abscess 01/08/2015  . Diverticulitis 28/41/32   complicated by abscess and required percutaneous drainage  . GERD (gastroesophageal reflux disease)   . H/O ETOH abuse   . History of chemotherapy last done jan 2017  . History of radiation therapy 01/05/13-02/10/13   45 gray to left occipital condyle region  . History of radiation therapy 12/01/16-12/10/16   Parasternal nodule, chest- 24 Gy total delivered in 8 fractions, Left sacro-iliac, pelvis- 24 Gy total delivered in 8 fractions   . History of radiation therapy 02/19/17-02/22/17   right temporal scalp 30 Gy in 10 fractions  . Intra-abdominal abscess ([place])   . Multiple myeloma ([place]) 2015  . Neuropathy   . Radiation 02/21/14-03/08/14   right posterior chest wall area 30 gray  . Radiation 04/19/14-05/02/14   lumbar spine 25 gray  . Skull lesion    Left occipital condyle  . Wrist fracture, left    x 2   Past Surgical History:  Procedure Laterality Date  . COLONOSCOPY N/A 04/19/2015   Procedure: COLONOSCOPY;   Surgeon: Danie Binder, MD;  [facility];  Service: Endoscopy;  Laterality: N/A;  1:30 PM  . CYST EXCISION  1959   tail bone  . IR IMAGING GUIDED PORT INSERTION  09/25/2017   Current Outpatient Medications on File Prior to Visit  Medication Sig Dispense Refill  . acyclovir (ZOVIRAX) 400 MG tablet TAKE 1 TABLET BY MOUTH 2 TIMES DAILY 60 tablet 3  . ALPRAZolam (XANAX) 1 MG tablet Take 1 tablet (1 mg total) by mouth 3 (three) times daily as needed for anxiety. 60 tablet 3  . CALCIUM-VITAMIN D PO Take 1 tablet by mouth 2 (two) times daily.     Marland Kitchen dexamethasone (DECADRON) 4 MG tablet Take 2.5 tablets (10 mg total) by mouth once a week. Take 10 mg once weekly 10 tablet 12  . diclofenac Sodium (VOLTAREN) 1 % GEL Apply topically as needed. Pt has not picked up yet    . fluticasone (FLONASE) 50 MCG/ACT nasal spray Place 2 sprays into both nostrils daily. 16 g 6  . furosemide (LASIX) 20 MG tablet TAKE 1 TABLET BY MOUTH DAILY AS NEEDED FOR EDEMA. 30 tablet 0  . gabapentin (NEURONTIN) 400 MG capsule TAKE 1 CAPSULE BY MOUTH 3 TIMES DAILY 90 capsule 4  . Homeopathic Products ([place]) Apply topically as needed.    . lidocaine-prilocaine (EMLA) cream Apply small amount over port one (1)  hour prior to appointment. 30 g 0  . MAGNESIUM PO Take 1 tablet by mouth at bedtime.    . montelukast (SINGULAIR) 10 MG tablet Take 1 tablet (10 mg total) by mouth at bedtime. 30 tablet 3  . omeprazole (PRILOSEC) 20 MG capsule TAKE 1 CAPSULE BY MOUTH ONCE DAILY 30 capsule 5  . Oxycodone HCl 10 MG TABS TAKE 1 TABLET BY MOUTH EVERY 8 HOURS AS NEEDED FOR PAIN 120 tablet 0  . senna-docusate (SENOKOT-S) 8.6-50 MG tablet Take 1 tablet by mouth 2 (two) times daily. 60 tablet 5  . Suvorexant (BELSOMRA) 10 MG TABS Take 10 mg by mouth at bedtime as needed. 30 tablet 3  . tamsulosin (FLOMAX) 0.4 MG CAPS capsule TAKE ONE CAPSULE BY MOUTH EVERY NIGHT ATBEDTIME 30 capsule 11  . tiZANidine (ZANAFLEX) 4 MG tablet Take  1 tablet (4 mg total) by mouth every 6 (six) hours as needed (headache). 30 tablet 0  . traZODone (DESYREL) 100 MG tablet Take 1 tablet (100 mg total) by mouth at bedtime. May titrate up to 300 mg nightly. 60 tablet 3  . zolendronic acid (ZOMETA) 4 MG/5ML injection Inject 4 mg into the vein every 30 (thirty) days.      No current facility-administered medications on file prior to visit.   Allergies  Allergen Reactions  . Codeine Other (See Comments)    Headache  . Morphine And Related Other (See Comments)    Makes him feel weird  . Revlimid [Lenalidomide] Other (See Comments)    "Causes me to become weak"   Social History   Socioeconomic History  . Marital status: Married    Spouse name: Not on file  . Number of children: 3  . Years of education: Not on file  . Highest education level: Not on file  Occupational History    Employer: RETIRED  Tobacco Use  . Smoking status: Never Smoker  . Smokeless tobacco: Never Used  Substance and Sexual Activity  . Alcohol use: No    Comment: " Not much no more"  . Drug use: No  . Sexual activity: Not on file  Other Topics Concern  . Not on file  Social History Narrative  . Not on file   Social Determinants of Health   Financial Resource Strain:   . Difficulty of Paying Living Expenses:   Food Insecurity:   . Worried About Charity fundraiser in the Last Year:   . Arboriculturist in the Last Year:   Transportation Needs:   . Film/video editor (Medical):   Marland Kitchen Lack of Transportation (Non-Medical):   Physical Activity:   . Days of Exercise per Week:   . Minutes of Exercise per Session:   Stress:   . Feeling of Stress :   Social Connections:   . Frequency of Communication with Friends and Family:   . Frequency of Social Gatherings with Friends and Family:   . Attends Religious Services:   . Active Member of Clubs or Organizations:   . Attends Archivist Meetings:   Marland Kitchen Marital Status:   Intimate Partner Violence:     . Fear of Current or Ex-Partner:   . Emotionally Abused:   Marland Kitchen Physically Abused:   . Sexually Abused:       Review of Systems     Objective:   Physical Exam Vitals reviewed.  Constitutional:      General: He is not in acute distress.    Appearance: He is  obese. He is not ill-appearing or toxic-appearing.  Cardiovascular:     Rate and Rhythm: Normal rate and regular rhythm.     Heart sounds: Normal heart sounds.  Pulmonary:     Effort: Pulmonary effort is normal. No respiratory distress.     Breath sounds: Normal breath sounds. No wheezing, rhonchi or rales.  Neurological:     General: No focal deficit present.     Mental Status: He is alert and oriented to person, place, and time.     Cranial Nerves: No cranial nerve deficit.     Motor: No weakness.           Assessment & Plan:  Burning with urination - Plan: Urinalysis, Routine w reflex microscopic  Insomnia, unspecified type  I suspect the patient had incompletely treated prostatitis.  I will prescribe Bactrim double strength tablets twice daily for 3 weeks and then reassess.  Regarding his insomnia I recommended 1 Xanax each evening and 1 gabapentin each evening.  I see no issue in the patient doing this.  I did caution the patient about falls and dizziness and sedation.

## 2019-06-23 ENCOUNTER — Other Ambulatory Visit: Payer: Self-pay

## 2019-06-23 ENCOUNTER — Other Ambulatory Visit (HOSPITAL_COMMUNITY): Payer: Self-pay | Admitting: *Deleted

## 2019-06-23 ENCOUNTER — Inpatient Hospital Stay (HOSPITAL_BASED_OUTPATIENT_CLINIC_OR_DEPARTMENT_OTHER): Payer: Medicare Other | Admitting: Hematology

## 2019-06-23 ENCOUNTER — Inpatient Hospital Stay (HOSPITAL_COMMUNITY): Payer: Medicare Other | Attending: Hematology

## 2019-06-23 ENCOUNTER — Inpatient Hospital Stay (HOSPITAL_COMMUNITY): Payer: Medicare Other

## 2019-06-23 VITALS — BP 106/56 | HR 59 | Temp 97.5°F | Resp 18 | Wt 203.0 lb

## 2019-06-23 DIAGNOSIS — Z79899 Other long term (current) drug therapy: Secondary | ICD-10-CM | POA: Diagnosis not present

## 2019-06-23 DIAGNOSIS — C9000 Multiple myeloma not having achieved remission: Secondary | ICD-10-CM

## 2019-06-23 DIAGNOSIS — Z5112 Encounter for antineoplastic immunotherapy: Secondary | ICD-10-CM | POA: Insufficient documentation

## 2019-06-23 LAB — COMPREHENSIVE METABOLIC PANEL
ALT: 14 U/L (ref 0–44)
AST: 17 U/L (ref 15–41)
Albumin: 3.5 g/dL (ref 3.5–5.0)
Alkaline Phosphatase: 19 U/L — ABNORMAL LOW (ref 38–126)
Anion gap: 10 (ref 5–15)
BUN: 22 mg/dL (ref 8–23)
CO2: 25 mmol/L (ref 22–32)
Calcium: 8.9 mg/dL (ref 8.9–10.3)
Chloride: 105 mmol/L (ref 98–111)
Creatinine, Ser: 0.83 mg/dL (ref 0.61–1.24)
GFR calc Af Amer: >60 mL/min (ref >60)
GFR calc non Af Amer: >60 mL/min (ref >60)
Glucose, Bld: 79 mg/dL (ref 70–99)
Potassium: 4.1 mmol/L (ref 3.5–5.1)
Sodium: 140 mmol/L (ref 135–145)
Total Bilirubin: 0.8 mg/dL (ref 0.3–1.2)
Total Protein: 5.3 g/dL — ABNORMAL LOW (ref 6.5–8.1)

## 2019-06-23 LAB — CBC WITH DIFFERENTIAL/PLATELET
Abs Immature Granulocytes: 0.01 10*3/uL (ref 0.00–0.07)
Basophils Absolute: 0 10*3/uL (ref 0.0–0.1)
Basophils Relative: 0 %
Eosinophils Absolute: 0 10*3/uL (ref 0.0–0.5)
Eosinophils Relative: 1 %
HCT: 39.6 % (ref 39.0–52.0)
Hemoglobin: 12.7 g/dL — ABNORMAL LOW (ref 13.0–17.0)
Immature Granulocytes: 0 %
Lymphocytes Relative: 28 %
Lymphs Abs: 1.4 10*3/uL (ref 0.7–4.0)
MCH: 30.9 pg (ref 26.0–34.0)
MCHC: 32.1 g/dL (ref 30.0–36.0)
MCV: 96.4 fL (ref 80.0–100.0)
Monocytes Absolute: 0.5 10*3/uL (ref 0.1–1.0)
Monocytes Relative: 10 %
Neutro Abs: 3.1 10*3/uL (ref 1.7–7.7)
Neutrophils Relative %: 61 %
Platelets: 138 10*3/uL — ABNORMAL LOW (ref 150–400)
RBC: 4.11 MIL/uL — ABNORMAL LOW (ref 4.22–5.81)
RDW: 13.8 % (ref 11.5–15.5)
WBC: 5.1 10*3/uL (ref 4.0–10.5)
nRBC: 0 % (ref 0.0–0.2)

## 2019-06-23 LAB — MAGNESIUM: Magnesium: 1.9 mg/dL (ref 1.7–2.4)

## 2019-06-23 LAB — LACTATE DEHYDROGENASE: LDH: 133 U/L (ref 98–192)

## 2019-06-23 MED ORDER — PROCHLORPERAZINE MALEATE 10 MG PO TABS
10.0000 mg | ORAL_TABLET | Freq: Once | ORAL | Status: AC
  Administered 2019-06-23: 10 mg via ORAL
  Filled 2019-06-23: qty 1

## 2019-06-23 MED ORDER — OXYCODONE HCL 10 MG PO TABS: 10.0000 mg | ORAL_TABLET | Freq: Three times a day (TID) | ORAL | 0 refills | Status: DC | PRN

## 2019-06-23 MED ORDER — DEXAMETHASONE 4 MG PO TABS
20.0000 mg | ORAL_TABLET | Freq: Once | ORAL | Status: AC
  Administered 2019-06-23: 20 mg via ORAL
  Filled 2019-06-23: qty 5

## 2019-06-23 MED ORDER — DIPHENHYDRAMINE HCL 25 MG PO CAPS
ORAL_CAPSULE | ORAL | Status: AC
  Filled 2019-06-23: qty 1

## 2019-06-23 MED ORDER — DARATUMUMAB-HYALURONIDASE-FIHJ 1800-30000 MG-UT/15ML ~~LOC~~ SOLN
1800.0000 mg | Freq: Once | SUBCUTANEOUS | Status: AC
  Administered 2019-06-23: 1800 mg via SUBCUTANEOUS
  Filled 2019-06-23: qty 15

## 2019-06-23 MED ORDER — DIPHENHYDRAMINE HCL 25 MG PO CAPS
50.0000 mg | ORAL_CAPSULE | Freq: Once | ORAL | Status: AC
  Administered 2019-06-23: 50 mg via ORAL
  Filled 2019-06-23: qty 2

## 2019-06-23 MED ORDER — ACETAMINOPHEN 325 MG PO TABS
650.0000 mg | ORAL_TABLET | Freq: Once | ORAL | Status: AC
  Administered 2019-06-23: 650 mg via ORAL
  Filled 2019-06-23: qty 2

## 2019-06-23 NOTE — Progress Notes (Signed)
Patient has been assessed, vital signs and labs have been reviewed by Dr. Katragadda. ANC, Creatinine, LFTs, and Platelets are within treatment parameters per Dr. Katragadda. The patient is good to proceed with treatment at this time.  

## 2019-06-23 NOTE — Patient Instructions (Signed)
[facility] at [facility] Discharge Instructions  Labs drawn from portacath today   Thank you for choosing [facility] at [facility] to provide your oncology and hematology care.  To afford each patient quality time with our provider, please arrive at least 15 minutes before your scheduled appointment time.   If you have a lab appointment with the Cancer Center please come in thru the Main Entrance and check in at the main information desk.  You need to re-schedule your appointment should you arrive 10 or more minutes late.  We strive to give you quality time with our providers, and arriving late affects you and other patients whose appointments are after yours.  Also, if you no show three or more times for appointments you may be dismissed from the clinic at the providers discretion.     Again, thank you for choosing [facility].  Our hope is that these requests will decrease the amount of time that you wait before being seen by our physicians.       _____________________________________________________________  Should you have questions after your visit to  Cancer Center, please contact our office at [phone] between the hours of 8:00 a.m. and 4:30 p.m.  Voicemails left after 4:00 p.m. will not be returned until the following business day.  For prescription refill requests, have your pharmacy contact our office and allow 72 hours.    Due to Covid, you will need to wear a mask upon entering the hospital. If you do not have a mask, a mask will be given to you at the Main Entrance upon arrival. For doctor visits, patients may have 1 support person with them. For treatment visits, patients can not have anyone with them due to social distancing guidelines and our immunocompromised population.     

## 2019-06-23 NOTE — Progress Notes (Signed)
Dean [name] [address], [place] [address]   CLINIC:  Medical Oncology/Hematology  PCP:  [name], MD [address] [place] [address]  [phone]  REASON FOR VISIT:  Follow-up for IgG kappa multiple myeloma   INTERVAL HISTORY:  Dean [name] 84 y.o. male returns for routine follow-up for his IgG kappa multiple myeloma. Dean [name] was last seen on 05/31/2019.  Dean [name] continues to take Trazodone without help for his sleep. Dean [name] is still in significant pain in his left hip. Dean [name] continues to take his pain medication 3 times per day, with mild relief. Dean [name] denies falls since his last visit.  Dean [name] believe that Dean [name] is gaining more strength, but his pain is making it more difficulty.    REVIEW OF SYSTEMS:  Review of Systems  Constitutional: Positive for appetite change (severely decreased) and fatigue (moderate). Negative for chills and fever.  HENT:   Negative for lump/mass, mouth sores, sore throat and trouble swallowing.   Eyes: Negative for eye problems.  Respiratory: Negative for chest tightness, cough, shortness of breath and wheezing.   Cardiovascular: Positive for leg swelling (ankles). Negative for chest pain and palpitations.  Gastrointestinal: Positive for constipation. Negative for abdominal pain, diarrhea, nausea and vomiting.  Genitourinary: Negative for bladder incontinence, dysuria, frequency and hematuria.   Musculoskeletal: Positive for arthralgias (right hip). Negative for back pain, flank pain and myalgias.  Skin: Negative for rash.  Neurological: Positive for numbness (feet). Negative for dizziness, headaches and light-headedness.  Hematological: Does not bruise/bleed easily.  Psychiatric/Behavioral: Positive for sleep disturbance (issues staying asleep). Negative for depression. The patient is not nervous/anxious.     PAST MEDICAL/SURGICAL HISTORY:  Past Medical History:  Diagnosis Date  . BPH (benign prostatic hyperplasia)   .  Colonic diverticular abscess 01/08/2015  . Diverticulitis 01/74/94   complicated by abscess and required percutaneous drainage  . GERD (gastroesophageal reflux disease)   . H/O ETOH abuse   . History of chemotherapy last done jan 2017  . History of radiation therapy 01/05/13-02/10/13   45 gray to left occipital condyle region  . History of radiation therapy 12/01/16-12/10/16   Parasternal nodule, chest- 24 Gy total delivered in 8 fractions, Left sacro-iliac, pelvis- 24 Gy total delivered in 8 fractions   . History of radiation therapy 02/19/17-02/22/17   right temporal scalp 30 Gy in 10 fractions  . Intra-abdominal abscess ([place])   . Multiple myeloma ([place]) 2015  . Neuropathy   . Radiation 02/21/14-03/08/14   right posterior chest wall area 30 gray  . Radiation 04/19/14-05/02/14   lumbar spine 25 gray  . Skull lesion    Left occipital condyle  . Wrist fracture, left    x 2   Past Surgical History:  Procedure Laterality Date  . COLONOSCOPY N/A 04/19/2015   Procedure: COLONOSCOPY;  Surgeon: Danie Binder, MD;  Location[facility];  Service: Endoscopy;  Laterality: N/A;  1:30 PM  . CYST EXCISION  1959   tail bone  . IR IMAGING GUIDED PORT INSERTION  09/25/2017    SOCIAL HISTORY:  Social History   Socioeconomic History  . Marital status: Married    Spouse name: Not on file  . Number of children: 3  . Years of education: Not on file  . Highest education level: Not on file  Occupational History    Employer: RETIRED  Tobacco Use  . Smoking status: Never Smoker  . Smokeless tobacco: Never Used  Substance and  Sexual Activity  . Alcohol use: No    Comment: " Not much no more"  . Drug use: No  . Sexual activity: Not on file  Other Topics Concern  . Not on file  Social History Narrative  . Not on file   Social Determinants of Health   Financial Resource Strain:   . Difficulty of Paying Living Expenses:   Food Insecurity:   . Worried About Running Out of Food in the Last  Year:   . Ran Out of Food in the Last Year:   Transportation Needs:   . Lack of Transportation (Medical):   . Lack of Transportation (Non-Medical):   Physical Activity:   . Days of Exercise per Week:   . Minutes of Exercise per Session:   Stress:   . Feeling of Stress :   Social Connections:   . Frequency of Communication with Friends and Family:   . Frequency of Social Gatherings with Friends and Family:   . Attends Religious Services:   . Active Member of Clubs or Organizations:   . Attends Club or Organization Meetings:   . Marital Status:   Intimate Partner Violence:   . Fear of Current or Ex-Partner:   . Emotionally Abused:   . Physically Abused:   . Sexually Abused:     FAMILY HISTORY:  Family History  Problem Relation Age of Onset  . Diabetes Mother   . Stroke Mother   . Hypertension Father     CURRENT MEDICATIONS:  Current Outpatient Medications  Medication Sig Dispense Refill  . acyclovir (ZOVIRAX) 400 MG tablet TAKE 1 TABLET BY MOUTH 2 TIMES DAILY 60 tablet 3  . CALCIUM-VITAMIN D PO Take 1 tablet by mouth 2 (two) times daily.     . dexamethasone (DECADRON) 4 MG tablet Take 2.5 tablets (10 mg total) by mouth once a week. Take 10 mg once weekly 10 tablet 12  . fluticasone (FLONASE) 50 MCG/ACT nasal spray Place 2 sprays into both nostrils daily. 16 g 6  . gabapentin (NEURONTIN) 400 MG capsule TAKE 1 CAPSULE BY MOUTH 3 TIMES DAILY 90 capsule 4  . lidocaine-prilocaine (EMLA) cream Apply small amount over port one (1) hour prior to appointment. 30 g 0  . MAGNESIUM PO Take 1 tablet by mouth at bedtime.    . montelukast (SINGULAIR) 10 MG tablet Take 1 tablet (10 mg total) by mouth at bedtime. 30 tablet 3  . omeprazole (PRILOSEC) 20 MG capsule TAKE 1 CAPSULE BY MOUTH ONCE DAILY 30 capsule 5  . senna-docusate (SENOKOT-S) 8.6-50 MG tablet Take 1 tablet by mouth 2 (two) times daily. 60 tablet 5  . sulfamethoxazole-trimethoprim (BACTRIM DS) 800-160 MG tablet Take 1 tablet by  mouth 2 (two) times daily. 42 tablet 0  . tamsulosin (FLOMAX) 0.4 MG CAPS capsule TAKE ONE CAPSULE BY MOUTH EVERY NIGHT ATBEDTIME 30 capsule 11  . traZODone (DESYREL) 100 MG tablet Take 1 tablet (100 mg total) by mouth at bedtime. May titrate up to 300 mg nightly. 60 tablet 3  . zolendronic acid (ZOMETA) 4 MG/5ML injection Inject 4 mg into the vein every 30 (thirty) days.     . ALPRAZolam (XANAX) 1 MG tablet Take 1 tablet (1 mg total) by mouth 3 (three) times daily as needed for anxiety. (Patient not taking: Reported on 06/23/2019) 60 tablet 3  . diclofenac Sodium (VOLTAREN) 1 % GEL Apply topically as needed. Pt has not picked up yet    . furosemide (LASIX) 20 MG tablet TAKE   1 TABLET BY MOUTH DAILY AS NEEDED FOR EDEMA. (Patient not taking: Reported on 06/23/2019) 30 tablet 0  . Homeopathic Products (THERAWORX RELIEF EX) Apply topically as needed.    . Oxycodone HCl 10 MG TABS Take 1 tablet (10 mg total) by mouth every 8 (eight) hours as needed. for pain 120 tablet 0  . Suvorexant (BELSOMRA) 10 MG TABS Take 10 mg by mouth at bedtime as needed. (Patient not taking: Reported on 06/23/2019) 30 tablet 3  . tiZANidine (ZANAFLEX) 4 MG tablet Take 1 tablet (4 mg total) by mouth every 6 (six) hours as needed (headache). (Patient not taking: Reported on 06/23/2019) 30 tablet 0   No current facility-administered medications for this visit.    ALLERGIES:  Allergies  Allergen Reactions  . Codeine Other (See Comments)    Headache  . Morphine And Related Other (See Comments)    Makes him feel weird  . Revlimid [Lenalidomide] Other (See Comments)    "Causes me to become weak"    PHYSICAL EXAM:  Performance status (ECOG): 1 - Symptomatic but completely ambulatory  Vitals:   06/23/19 0906  BP: (!) 106/56  Pulse: (!) 59  Resp: 18  Temp: (!) 97.5 F (36.4 C)  SpO2: 97%   Wt Readings from Last 3 Encounters:  06/23/19 203 lb (92.1 kg)  06/16/19 208 lb (94.3 kg)  05/31/19 199 lb 8 oz (90.5 kg)    Physical Exam Constitutional:      Appearance: Normal appearance.  HENT:     Nose: No congestion.     Mouth/Throat:     Mouth: Mucous membranes are moist.  Eyes:     Extraocular Movements: Extraocular movements intact.     Pupils: Pupils are equal, round, and reactive to light.  Cardiovascular:     Rate and Rhythm: Normal rate and regular rhythm.     Heart sounds: No murmur. No gallop.   Pulmonary:     Breath sounds: No wheezing, rhonchi or rales.  Abdominal:     Tenderness: There is no abdominal tenderness.  Musculoskeletal:        General: No tenderness.     Cervical back: Normal range of motion. No tenderness.     Right lower leg: No edema.     Left lower leg: No edema.  Skin:    General: Skin is warm and dry.     Findings: No bruising, erythema or rash.  Neurological:     Mental Status: Dean [name] is alert and oriented to person, place, and time.     Sensory: No sensory deficit.     Motor: No weakness.  Psychiatric:        Mood and Affect: Mood normal.        Behavior: Behavior normal.        Thought Content: Thought content normal.        Judgment: Judgment normal.     LABORATORY DATA:  I have reviewed the labs as listed.  CBC Latest Ref Rng & Units 06/23/2019 05/31/2019 04/28/2019  WBC 4.0 - 10.5 K/uL 5.1 4.5 5.1  Hemoglobin 13.0 - 17.0 g/dL 12.7(L) 14.1 13.4  Hematocrit 39.0 - 52.0 % 39.6 43.5 40.3  Platelets 150 - 400 K/uL 138(L) 136(L) 110(L)   CMP Latest Ref Rng & Units 06/23/2019 05/31/2019 04/28/2019  Glucose 70 - 99 mg/dL 79 106(H) 90  BUN 8 - 23 mg/dL 22 20 20  Creatinine 0.61 - 1.24 mg/dL 0.83 0.62 0.79  Sodium 135 - 145 mmol/L 140 139   139  Potassium 3.5 - 5.1 mmol/L 4.1 4.2 3.1(L)  Chloride 98 - 111 mmol/L 105 106 102  CO2 22 - 32 mmol/L _0 Calcium 8.9 - 10.3 mg/dL 8.9 8.8(L) 8.6(L)  Total Protein 6.5 - 8.1 g/dL 5.3(L) 5.5(L) 5.2(L)  Total Bilirubin 0.3 - 1.2 mg/dL 0.8 0.6 1.2  Alkaline Phos 38 - 126 U/L 19(L) 21(L) 20(L)  AST 15 - 41 U/L _1 ALT 0 - 44 U/L _2 Component Value Date/Time   RBC 4.11 (L) 06/23/2019 0915   MCV 96.4 06/23/2019 0915   MCH 30.9 06/23/2019 0915   MCHC 32.1 06/23/2019 0915   RDW 13.8 06/23/2019 0915   LYMPHSABS 1.4 06/23/2019 0915   MONOABS 0.5 06/23/2019 0915   EOSABS 0.0 06/23/2019 0915   BASOSABS 0.0 06/23/2019 0915    DIAGNOSTIC IMAGING:  I have independently reviewed the scans and discussed with the patient.  ASSESSMENT & PLAN:  Multiple myeloma without remission (HCC) Assessment: Recurrent IgG kappa multiple myeloma: On monthly daratumumab and prednisone. -MRI of the thoracic spine showed no myeloma lesions.  Right first rib unchanged.  Left rib lesion has improved.  Left sacral ala vertical fracture present.  Spinal stenosis at L3-L4.  Compression fracture of L2 and L4.  Plan: 1.  IgG kappa myeloma: -I reviewed myeloma panel from 05/31/2019.  M spike is 0.1 g.  Kappa light chains of 14.4 with lambda light chain of 3 and ratio 4.8.  Previous ratio was 4.03 on 04/28/2019.  M spike has been stable. -I reviewed his routine labs.  We will proceed with his daratumumab today as the ratio has been trending up. -I will reevaluate him in 4 weeks with labs.  2.  Low back pain: -Continue oxycodone 10 mg 3 times daily.  Pain is reasonably well controlled.  3.  Peripheral neuropathy: -Continue gabapentin 3 times daily.  4.  Bone protection: -Calcium is normal today.  Creatinine is normal.  Continue Zometa monthly.  5.  Sleeping difficulty: -Dean [name] is taking trazodone, Xanax and gabapentin which is helping for 4 hours.  I have suggested him to take another Xanax when Dean [name] wakes up.  6.  Hypokalemia: -Potassium is 4.1.  Continue potassium supplements at home.    Orders placed this encounter:  Orders Placed This Encounter  Procedures  . CBC with Differential/Platelet  . Comprehensive metabolic panel  . Magnesium  . Protein electrophoresis, serum  . Kappa/lambda light chains  .  Lactate dehydrogenase       Derek Jack, MD, 06/24/19 4:05 PM  [place] [phone]   I, Jacqualyn Posey, am acting as a scribe for Dr. Sanda Linger.  I, Derek Jack MD, have reviewed the above documentation for accuracy and completeness, and I agree with the above.

## 2019-06-23 NOTE — Patient Instructions (Signed)
[facility] Discharge Instructions for Patients Receiving Chemotherapy  Today you received the following chemotherapy agents   To help prevent nausea and vomiting after your treatment, we encourage you to take your nausea medication   If you develop nausea and vomiting that is not controlled by your nausea medication, call the clinic.   BELOW ARE SYMPTOMS THAT SHOULD BE REPORTED IMMEDIATELY:  *FEVER GREATER THAN 100.5 F  *CHILLS WITH OR WITHOUT FEVER  NAUSEA AND VOMITING THAT IS NOT CONTROLLED WITH YOUR NAUSEA MEDICATION  *UNUSUAL SHORTNESS OF BREATH  *UNUSUAL BRUISING OR BLEEDING  TENDERNESS IN MOUTH AND THROAT WITH OR WITHOUT PRESENCE OF ULCERS  *URINARY PROBLEMS  *BOWEL PROBLEMS  UNUSUAL RASH Items with * indicate a potential emergency and should be followed up as soon as possible.  Feel free to call the clinic should you have any questions or concerns. The clinic phone number is [phone].  Please show the CHEMO ALERT CARD at check-in to the Emergency Department and triage nurse.   

## 2019-06-23 NOTE — Patient Instructions (Signed)
[place] at [facility] Discharge Instructions  You were seen today by Dr. Delton Coombes. He went over your recent results. Your Myeloma panels from April look good. Dr. Delton Coombes will see you back in for labs and follow up.   Thank you for choosing [place] at [facility] to provide your oncology and hematology care.  To afford each patient quality time with our provider, please arrive at least 15 minutes before your scheduled appointment time.   If you have a lab appointment with the [facility] please come in thru the  Main Entrance and check in at the main information desk  You need to re-schedule your appointment should you arrive 10 or more minutes late.  We strive to give you quality time with our providers, and arriving late affects you and other patients whose appointments are after yours.  Also, if you no show three or more times for appointments you may be dismissed from the clinic at the providers discretion.     Again, thank you for choosing [facility].  Our hope is that these requests will decrease the amount of time that you wait before being seen by our physicians.       _____________________________________________________________  Should you have questions after your visit to [facility], please contact our office at [phone] between the hours of 8:00 a.m. and 4:30 p.m.  Voicemails left after 4:00 p.m. will not be returned until the following business day.  For prescription refill requests, have your pharmacy contact our office and allow 72 hours.    Cancer Center Support Programs:   > Cancer Support Group  2nd Tuesday of the month 1pm-2pm, Journey Room

## 2019-06-23 NOTE — Progress Notes (Signed)
Labs reviewed today at office visit. Proceed with treatment today per MD.  Treatment given per orders. Patient tolerated it well without problems. Vitals stable and discharged home from clinic via wheelchair. Follow up as scheduled.

## 2019-06-24 LAB — KAPPA/LAMBDA LIGHT CHAINS
Kappa free light chain: 17.6 mg/L (ref 3.3–19.4)
Kappa, lambda light chain ratio: 5.03 — ABNORMAL HIGH (ref 0.26–1.65)
Lambda free light chains: 3.5 mg/L — ABNORMAL LOW (ref 5.7–26.3)

## 2019-06-24 LAB — PROTEIN ELECTROPHORESIS, SERUM
A/G Ratio: 1.5 (ref 0.7–1.7)
Albumin ELP: 3.1 g/dL (ref 2.9–4.4)
Alpha-1-Globulin: 0.2 g/dL (ref 0.0–0.4)
Alpha-2-Globulin: 0.6 g/dL (ref 0.4–1.0)
Beta Globulin: 0.8 g/dL (ref 0.7–1.3)
Gamma Globulin: 0.5 g/dL (ref 0.4–1.8)
Globulin, Total: 2.1 g/dL — ABNORMAL LOW (ref 2.2–3.9)
M-Spike, %: 0.2 g/dL — ABNORMAL HIGH
Total Protein ELP: 5.2 g/dL — ABNORMAL LOW (ref 6.0–8.5)

## 2019-06-24 NOTE — Assessment & Plan Note (Signed)
Assessment: Recurrent IgG kappa multiple myeloma: On monthly daratumumab and prednisone. -MRI of the thoracic spine showed no myeloma lesions.  Right first rib unchanged.  Left rib lesion has improved.  Left sacral ala vertical fracture present.  Spinal stenosis at L3-L4.  Compression fracture of L2 and L4.  Plan: 1.  IgG kappa myeloma: -I reviewed myeloma panel from 05/31/2019.  M spike is 0.1 g.  Kappa light chains of 14.4 with lambda light chain of 3 and ratio 4.8.  Previous ratio was 4.03 on 04/28/2019.  M spike has been stable. -I reviewed his routine labs.  We will proceed with his daratumumab today as the ratio has been trending up. -I will reevaluate him in 4 weeks with labs.  2.  Low back pain: -Continue oxycodone 10 mg 3 times daily.  Pain is reasonably well controlled.  3.  Peripheral neuropathy: -Continue gabapentin 3 times daily.  4.  Bone protection: -Calcium is normal today.  Creatinine is normal.  Continue Zometa monthly.  5.  Sleeping difficulty: -He is taking trazodone, Xanax and gabapentin which is helping for 4 hours.  I have suggested him to take another Xanax when he wakes up.  6.  Hypokalemia: -Potassium is 4.1.  Continue potassium supplements at home.

## 2019-07-13 DIAGNOSIS — Z23 Encounter for immunization: Secondary | ICD-10-CM | POA: Diagnosis not present

## 2019-07-21 ENCOUNTER — Inpatient Hospital Stay (HOSPITAL_COMMUNITY): Payer: Medicare Other

## 2019-07-21 ENCOUNTER — Inpatient Hospital Stay (HOSPITAL_BASED_OUTPATIENT_CLINIC_OR_DEPARTMENT_OTHER): Payer: Medicare Other | Admitting: Hematology

## 2019-07-21 ENCOUNTER — Other Ambulatory Visit: Payer: Self-pay

## 2019-07-21 ENCOUNTER — Inpatient Hospital Stay (HOSPITAL_COMMUNITY): Payer: Medicare Other | Attending: Hematology

## 2019-07-21 VITALS — BP 114/76 | HR 61 | Temp 97.2°F | Resp 18 | Wt 200.4 lb

## 2019-07-21 DIAGNOSIS — C9000 Multiple myeloma not having achieved remission: Secondary | ICD-10-CM

## 2019-07-21 DIAGNOSIS — Z5112 Encounter for antineoplastic immunotherapy: Secondary | ICD-10-CM | POA: Diagnosis present

## 2019-07-21 LAB — COMPREHENSIVE METABOLIC PANEL
ALT: 11 U/L (ref 0–44)
AST: 13 U/L — ABNORMAL LOW (ref 15–41)
Albumin: 3.4 g/dL — ABNORMAL LOW (ref 3.5–5.0)
Alkaline Phosphatase: 21 U/L — ABNORMAL LOW (ref 38–126)
Anion gap: 9 (ref 5–15)
BUN: 26 mg/dL — ABNORMAL HIGH (ref 8–23)
CO2: 27 mmol/L (ref 22–32)
Calcium: 8.7 mg/dL — ABNORMAL LOW (ref 8.9–10.3)
Chloride: 102 mmol/L (ref 98–111)
Creatinine, Ser: 0.75 mg/dL (ref 0.61–1.24)
GFR calc Af Amer: >60 mL/min (ref >60)
GFR calc non Af Amer: >60 mL/min (ref >60)
Glucose, Bld: 92 mg/dL (ref 70–99)
Potassium: 3.6 mmol/L (ref 3.5–5.1)
Sodium: 138 mmol/L (ref 135–145)
Total Bilirubin: 0.7 mg/dL (ref 0.3–1.2)
Total Protein: 5.4 g/dL — ABNORMAL LOW (ref 6.5–8.1)

## 2019-07-21 LAB — CBC WITH DIFFERENTIAL/PLATELET
Abs Immature Granulocytes: 0.01 10*3/uL (ref 0.00–0.07)
Basophils Absolute: 0 10*3/uL (ref 0.0–0.1)
Basophils Relative: 0 %
Eosinophils Absolute: 0 10*3/uL (ref 0.0–0.5)
Eosinophils Relative: 0 %
HCT: 39.6 % (ref 39.0–52.0)
Hemoglobin: 13.2 g/dL (ref 13.0–17.0)
Immature Granulocytes: 0 %
Lymphocytes Relative: 15 %
Lymphs Abs: 0.6 10*3/uL — ABNORMAL LOW (ref 0.7–4.0)
MCH: 31.4 pg (ref 26.0–34.0)
MCHC: 33.3 g/dL (ref 30.0–36.0)
MCV: 94.1 fL (ref 80.0–100.0)
Monocytes Absolute: 0.4 10*3/uL (ref 0.1–1.0)
Monocytes Relative: 10 %
Neutro Abs: 3 10*3/uL (ref 1.7–7.7)
Neutrophils Relative %: 75 %
Platelets: 110 10*3/uL — ABNORMAL LOW (ref 150–400)
RBC: 4.21 MIL/uL — ABNORMAL LOW (ref 4.22–5.81)
RDW: 13.2 % (ref 11.5–15.5)
WBC: 4 10*3/uL (ref 4.0–10.5)
nRBC: 0 % (ref 0.0–0.2)

## 2019-07-21 LAB — MAGNESIUM: Magnesium: 1.9 mg/dL (ref 1.7–2.4)

## 2019-07-21 LAB — LACTATE DEHYDROGENASE: LDH: 119 U/L (ref 98–192)

## 2019-07-21 MED ORDER — ZOLEDRONIC ACID 4 MG/100ML IV SOLN
4.0000 mg | Freq: Once | INTRAVENOUS | Status: AC
  Administered 2019-07-21: 4 mg via INTRAVENOUS
  Filled 2019-07-21: qty 100

## 2019-07-21 MED ORDER — ACETAMINOPHEN 325 MG PO TABS
650.0000 mg | ORAL_TABLET | Freq: Once | ORAL | Status: AC
  Administered 2019-07-21: 650 mg via ORAL
  Filled 2019-07-21: qty 2

## 2019-07-21 MED ORDER — SODIUM CHLORIDE 0.9% FLUSH
10.0000 mL | INTRAVENOUS | Status: DC | PRN
  Administered 2019-07-21: 10 mL

## 2019-07-21 MED ORDER — PROCHLORPERAZINE MALEATE 10 MG PO TABS
10.0000 mg | ORAL_TABLET | Freq: Once | ORAL | Status: AC
  Administered 2019-07-21: 10 mg via ORAL
  Filled 2019-07-21: qty 1

## 2019-07-21 MED ORDER — HEPARIN SOD (PORK) LOCK FLUSH 100 UNIT/ML IV SOLN
500.0000 [IU] | Freq: Once | INTRAVENOUS | Status: AC | PRN
  Administered 2019-07-21: 500 [IU]

## 2019-07-21 MED ORDER — DARATUMUMAB-HYALURONIDASE-FIHJ 1800-30000 MG-UT/15ML ~~LOC~~ SOLN
1800.0000 mg | Freq: Once | SUBCUTANEOUS | Status: AC
  Administered 2019-07-21: 1800 mg via SUBCUTANEOUS
  Filled 2019-07-21: qty 15

## 2019-07-21 MED ORDER — DEXAMETHASONE 4 MG PO TABS
20.0000 mg | ORAL_TABLET | Freq: Once | ORAL | Status: AC
  Administered 2019-07-21: 20 mg via ORAL
  Filled 2019-07-21: qty 5

## 2019-07-21 MED ORDER — DIPHENHYDRAMINE HCL 25 MG PO CAPS
50.0000 mg | ORAL_CAPSULE | Freq: Once | ORAL | Status: AC
  Administered 2019-07-21: 50 mg via ORAL
  Filled 2019-07-21: qty 2

## 2019-07-21 MED ORDER — SODIUM CHLORIDE 0.9 % IV SOLN: Freq: Once | INTRAVENOUS | Status: AC

## 2019-07-21 NOTE — Patient Instructions (Signed)
[facility] at [facility] Discharge Instructions  Labs drawn from portacath today   Thank you for choosing  Cancer Center at [facility] to provide your oncology and hematology care.  To afford each patient quality time with our provider, please arrive at least 15 minutes before your scheduled appointment time.   If you have a lab appointment with the Cancer Center please come in thru the Main Entrance and check in at the main information desk.  You need to re-schedule your appointment should you arrive 10 or more minutes late.  We strive to give you quality time with our providers, and arriving late affects you and other patients whose appointments are after yours.  Also, if you no show three or more times for appointments you may be dismissed from the clinic at the providers discretion.     Again, thank you for choosing [facility].  Our hope is that these requests will decrease the amount of time that you wait before being seen by our physicians.       _____________________________________________________________  Should you have questions after your visit to  Cancer Center, please contact our office at [phone] between the hours of 8:00 a.m. and 4:30 p.m.  Voicemails left after 4:00 p.m. will not be returned until the following business day.  For prescription refill requests, have your pharmacy contact our office and allow 72 hours.    Due to Covid, you will need to wear a mask upon entering the hospital. If you do not have a mask, a mask will be given to you at the Main Entrance upon arrival. For doctor visits, patients may have 1 support person with them. For treatment visits, patients can not have anyone with them due to social distancing guidelines and our immunocompromised population.     

## 2019-07-21 NOTE — Progress Notes (Signed)
[name], [place] [address]   CLINIC:  Medical Oncology/Hematology  PCP:  [name], MD [address] [place] [address] [phone]   REASON FOR VISIT:  Follow-up for IgG kappa multiple myeloma  CURRENT THERAPY: Darzalex  BRIEF ONCOLOGIC HISTORY:  Oncology History  Multiple myeloma without remission ([place])  11/16/2012 Initial Diagnosis   L occipital condyle destructive lesion, not biopsied secondary to location. XRT   12/01/2012 Imaging   L3 superior endplate compression FX, no lytic lesions to suggest myeloma   02/06/2014 Imaging   soft tissue mass along the right posterior fifth rib with some rib destruction   02/16/2014 Miscellaneous   Normal CBC, Normal CMP, kappa,lamda ratio of 2.62 (abnormal), UIEP with slightly restricted band, IgG kappa, SPEP/IEP with monoclonal protein at 0.79 g/dl, normal igG, suppressed IGM   02/20/2014 Bone Marrow Biopsy   33% kappa restricted plasma cells Normal FISH, normal cytogenetics   02/21/2014 - 03/08/2014 Radiation Therapy   30Gy to R rib lesion, lesion not biopsied   03/16/2014 PET scan   Scattered hypermetabolic osseous lesions, including the calvarium, ribs, left scapula, sacrum, and left femoral shaft. An expansile left vertebral body lesion at L3 extends into the epidural space of the left lateral recess,    03/17/2014 - 04/07/2014 Chemotherapy   Velcade and Dexamethasone due to initial denial of Revlimid by insurance company.  Zometa monthly.   03/22/2014 Imaging   MR_ L-Spine- Enhancing lesions compatible with multiple myeloma at L2, L3, and S1-2.   04/07/2014 - 07/28/2014 Chemotherapy   RVD with Revlimid at 25 mg days 1-14.  Revlimid dose reduced to 25 mg every other day x 14 days with 7 day respite beginning on day 1 of cycle 2.   04/17/2014 Adverse Reaction   Revlimid-induced rash.  Treated with steroids.   07/28/2014 - 08/04/2014 Chemotherapy   Revlimid daily   08/04/2014  Adverse Reaction   Velcade-induced peripheral neuropathy   08/04/2014 Treatment Plan Change   D/C Velcade   08/11/2014 PET scan   Response to therapy. Improvement and resolution of foci of osseous hypermetabolism.   08/22/2014 - 08/28/2014 Chemotherapy   Revlimid 25 mg days 1-21 every 28 days   08/28/2014 Adverse Reaction   Revlimid-induced rash. Medication held.  Medrol dose Pak prescribed.   09/04/2014 - 01/08/2015 Chemotherapy   Revlimid 10 mg every other day, without dexamethasone   01/08/2015 - 01/15/2015 Hospital Admission   Colonic diverticular abscess with IR drain placement   03/02/2015 PET scan   Only bony uptake is low activity at site of deformity/callus at R second rib FX, high activity in sigmoid colon, advanced sigmoid divertidulosis. Abscess not resolved?   06/04/2015 Imaging   CT nonobstructive L renal calculus, diverticulosis of descending and sigmoid colon without inflammation, moderate prostatic enlargement   07/12/2015 Surgery   Diverticulitis s/p robotic sigmoid colectomy with Dr. Johney Maine   08/23/2015 PET scan   Primarily similar hypermetabolic osseous foci within the R sided ribs. new L third rib focus of hypermetab and sclerosis is favored to be related to healing FX. no soft tissue myeloma id'd. Presumed postop hypermetab and edema about the R pelvic wall    03/06/2016 PET scan   1. Reduced activity in the prior lesion such as the right second and fifth rib lesions, activity nearly completely resolved and significantly less than added mediastinal blood pool activity. No new lesions are identified. 2. Other imaging findings of potential  clinical significance: Coronary, aortic arch, and branch vessel atherosclerotic vascular disease. Aortoiliac atherosclerotic vascular disease. Enlarged prostate gland. Colonic diverticula.   02/17/2017 PET scan   HEAD/NECK: No hypermetabolic activity in the scalp. No hypermetabolic cervical lymph nodes. CHEST: No hypermetabolic  mediastinal or hilar nodes. Right upper lobe scarring/atelectasis. No suspicious pulmonary nodules on the CT scan. ABDOMEN/PELVIS: No abnormal hypermetabolic activity within the liver, pancreas, adrenal glands, or spleen. No hypermetabolic lymph nodes in the abdomen or pelvis. Atherosclerotic calcifications of the abdominal aorta and branch vessels. Left renal sinus cysts. Sigmoid diverticulosis, without evidence of diverticulitis. Prostatomegaly. SKELETON: Vague hypermetabolism involving the inferior sternum, max SUV 3.2, previously 8.2. Focal hypermetabolism involving the left sacrum, max SUV 4.0, previously 6.7. EXTREMITIES: No abnormal hypermetabolic activity in the lower extremities.   02/18/2017 -  Chemotherapy   Bortezomib 1.38m/m2 QWk + Dexamethasone 148mQTue/Fri --Cycle #1, 02/18/17    02/18/2017 - 08/26/2017 Chemotherapy   The patient had bortezomib SQ (VELCADE) chemo injection 3 mg, 1.3 mg/m2 = 3 mg, Subcutaneous,  Once, 7 of 9 cycles Administration: 3 mg (02/18/2017), 3 mg (02/26/2017), 3 mg (03/04/2017), 3 mg (03/11/2017), 3 mg (04/08/2017), 3 mg (03/18/2017), 3 mg (03/25/2017), 3 mg (04/01/2017), 3 mg (05/06/2017), 3 mg (05/20/2017), 3 mg (06/03/2017), 3 mg (06/17/2017), 3 mg (07/01/2017), 3 mg (07/15/2017), 3 mg (07/29/2017), 3 mg (08/12/2017), 3 mg (08/26/2017)  for chemotherapy treatment.    09/17/2017 -  Chemotherapy   The patient had dexamethasone (DECADRON) tablet 20 mg, 20 mg (100 % of original dose 20 mg), Oral,  Once, 4 of 6 cycles Dose modification: 20 mg (original dose 20 mg, Cycle 17) Administration: 20 mg (01/17/2019), 20 mg (02/16/2019), 20 mg (03/16/2019), 20 mg (06/23/2019) daratumumab-hyaluronidase-fihj (DARZALEX FASPRO) 1800-30000 MG-UT/15ML chemo SQ injection 1,800 mg, 1,800 mg, Subcutaneous,  Once, 7 of 9 cycles Administration: 1,800 mg (10/25/2018), 1,800 mg (11/22/2018), 1,800 mg (12/20/2018), 1,800 mg (01/17/2019), 1,800 mg (02/16/2019), 1,800 mg (03/16/2019), 1,800 mg  (06/23/2019) daratumumab (DARZALEX) 1,800 mg in sodium chloride 0.9 % 910 mL (1.8 mg/mL) chemo infusion, 15.9 mg/kg = 1,820 mg, Intravenous, Once, 1 of 1 cycle Administration: 1,800 mg (09/17/2017) daratumumab (DARZALEX) 1,800 mg in sodium chloride 0.9 % 410 mL (3.6 mg/mL) chemo infusion, 1,820 mg, Intravenous, Once, 13 of 13 cycles Administration: 1,800 mg (10/28/2017), 1,800 mg (11/04/2017), 1,800 mg (11/11/2017), 1,800 mg (11/18/2017), 1,800 mg (11/25/2017), 1,800 mg (12/02/2017), 1,800 mg (12/09/2017), 1,800 mg (12/16/2017), 1,800 mg (12/30/2017), 1,800 mg (04/26/2018), 1,800 mg (01/13/2018), 1,800 mg (02/12/2018), 1,800 mg (02/26/2018), 1,800 mg (03/15/2018), 1,800 mg (03/29/2018), 1,800 mg (04/12/2018), 1,800 mg (05/10/2018), 1,800 mg (06/07/2018), 1,800 mg (07/05/2018), 1,800 mg (08/02/2018), 1,800 mg (08/30/2018), 1,800 mg (09/27/2018)  for chemotherapy treatment.      CANCER STAGING: Cancer Staging No matching staging information was found for the patient.  INTERVAL HISTORY:  Mr. JaRehaan Viloria a [age].o. male, returns for routine follow-up and consideration for next cycle of chemotherapy. JaSaqibas last seen on 06/23/2019.  Due for cycle #21 of Darzalex today.   Overall, he tells me he has been tolerating the treatments well. He reports having pain in his right and left hip and sternum. He denies having any heart issues. He denies any recent falls.  Overall, he feels ready for next cycle of chemo today.    REVIEW OF SYSTEMS:  Review of Systems  Constitutional: Positive for appetite change (moderately decreased) and fatigue (severe).  Cardiovascular: Positive for chest pain (mid-chest pain).  Musculoskeletal: Positive for arthralgias (L hip pain)  and back pain (9/10 low back pain).  Neurological: Positive for headaches.  All other systems reviewed and are negative.   PAST MEDICAL/SURGICAL HISTORY:  Past Medical History:  Diagnosis Date  . BPH (benign prostatic hyperplasia)   . Colonic  diverticular abscess 01/08/2015  . Diverticulitis 70/96/28   complicated by abscess and required percutaneous drainage  . GERD (gastroesophageal reflux disease)   . H/O ETOH abuse   . History of chemotherapy last done jan 2017  . History of radiation therapy 01/05/13-02/10/13   45 gray to left occipital condyle region  . History of radiation therapy 12/01/16-12/10/16   Parasternal nodule, chest- 24 Gy total delivered in 8 fractions, Left sacro-iliac, pelvis- 24 Gy total delivered in 8 fractions   . History of radiation therapy 02/19/17-02/22/17   right temporal scalp 30 Gy in 10 fractions  . Intra-abdominal abscess ([place])   . Multiple myeloma ([place]) 2015  . Neuropathy   . Radiation 02/21/14-03/08/14   right posterior chest wall area 30 gray  . Radiation 04/19/14-05/02/14   lumbar spine 25 gray  . Skull lesion    Left occipital condyle  . Wrist fracture, left    x 2   Past Surgical History:  Procedure Laterality Date  . COLONOSCOPY N/A 04/19/2015   Procedure: COLONOSCOPY;  Surgeon: Danie Binder, MD;  Location: [facility];  Service: Endoscopy;  Laterality: N/A;  1:30 PM  . CYST EXCISION  1959   tail bone  . IR IMAGING GUIDED PORT INSERTION  09/25/2017    SOCIAL HISTORY:  Social History   Socioeconomic History  . Marital status: Married    Spouse name: Not on file  . Number of children: 3  . Years of education: Not on file  . Highest education level: Not on file  Occupational History    Employer: RETIRED  Tobacco Use  . Smoking status: Never Smoker  . Smokeless tobacco: Never Used  Vaping Use  . Vaping Use: Never used  Substance and Sexual Activity  . Alcohol use: No    Comment: " Not much no more"  . Drug use: No  . Sexual activity: Not on file  Other Topics Concern  . Not on file  Social History Narrative  . Not on file   Social Determinants of Health   Financial Resource Strain:   . Difficulty of Paying Living Expenses:   Food Insecurity:   . Worried About  Charity fundraiser in the Last Year:   . Arboriculturist in the Last Year:   Transportation Needs:   . Film/video editor (Medical):   Marland Kitchen Lack of Transportation (Non-Medical):   Physical Activity:   . Days of Exercise per Week:   . Minutes of Exercise per Session:   Stress:   . Feeling of Stress :   Social Connections:   . Frequency of Communication with Friends and Family:   . Frequency of Social Gatherings with Friends and Family:   . Attends Religious Services:   . Active Member of Clubs or Organizations:   . Attends Archivist Meetings:   Marland Kitchen Marital Status:   Intimate Partner Violence:   . Fear of Current or Ex-Partner:   . Emotionally Abused:   Marland Kitchen Physically Abused:   . Sexually Abused:     FAMILY HISTORY:  Family History  Problem Relation Age of Onset  . Diabetes Mother   . Stroke Mother   . Hypertension Father     CURRENT MEDICATIONS:  Current Outpatient Medications  Medication Sig Dispense Refill  . acyclovir (ZOVIRAX) 400 MG tablet TAKE 1 TABLET BY MOUTH 2 TIMES DAILY 60 tablet 3  . ALPRAZolam (XANAX) 1 MG tablet Take 1 tablet (1 mg total) by mouth 3 (three) times daily as needed for anxiety. 60 tablet 3  . CALCIUM-VITAMIN D PO Take 1 tablet by mouth 2 (two) times daily.     Marland Kitchen dexamethasone (DECADRON) 4 MG tablet Take 2.5 tablets (10 mg total) by mouth once a week. Take 10 mg once weekly 10 tablet 12  . diclofenac Sodium (VOLTAREN) 1 % GEL Apply topically as needed. Pt has not picked up yet    . fluticasone (FLONASE) 50 MCG/ACT nasal spray Place 2 sprays into both nostrils daily. 16 g 6  . furosemide (LASIX) 20 MG tablet TAKE 1 TABLET BY MOUTH DAILY AS NEEDED FOR EDEMA. 30 tablet 0  . gabapentin (NEURONTIN) 400 MG capsule TAKE 1 CAPSULE BY MOUTH 3 TIMES DAILY 90 capsule 4  . Homeopathic Products ([place]) Apply topically as needed.    Marland Kitchen MAGNESIUM PO Take 1 tablet by mouth at bedtime.    . montelukast (SINGULAIR) 10 MG tablet Take 1 tablet  (10 mg total) by mouth at bedtime. 30 tablet 3  . omeprazole (PRILOSEC) 20 MG capsule TAKE 1 CAPSULE BY MOUTH ONCE DAILY 30 capsule 5  . Oxycodone HCl 10 MG TABS Take 1 tablet (10 mg total) by mouth every 8 (eight) hours as needed. for pain 120 tablet 0  . senna-docusate (SENOKOT-S) 8.6-50 MG tablet Take 1 tablet by mouth 2 (two) times daily. 60 tablet 5  . sulfamethoxazole-trimethoprim (BACTRIM DS) 800-160 MG tablet Take 1 tablet by mouth 2 (two) times daily. 42 tablet 0  . Suvorexant (BELSOMRA) 10 MG TABS Take 10 mg by mouth at bedtime as needed. 30 tablet 3  . tamsulosin (FLOMAX) 0.4 MG CAPS capsule TAKE ONE CAPSULE BY MOUTH EVERY NIGHT ATBEDTIME 30 capsule 11  . tiZANidine (ZANAFLEX) 4 MG tablet Take 1 tablet (4 mg total) by mouth every 6 (six) hours as needed (headache). 30 tablet 0  . traZODone (DESYREL) 100 MG tablet Take 1 tablet (100 mg total) by mouth at bedtime. May titrate up to 300 mg nightly. 60 tablet 3  . zolendronic acid (ZOMETA) 4 MG/5ML injection Inject 4 mg into the vein every 30 (thirty) days.     Marland Kitchen lidocaine-prilocaine (EMLA) cream Apply small amount over port one (1) hour prior to appointment. (Patient not taking: Reported on 07/21/2019) 30 g 0   No current facility-administered medications for this visit.    ALLERGIES:  Allergies  Allergen Reactions  . Codeine Other (See Comments)    Headache  . Morphine And Related Other (See Comments)    Makes him feel weird  . Revlimid [Lenalidomide] Other (See Comments)    "Causes me to become weak"    PHYSICAL EXAM:  Performance status (ECOG): 1 - Symptomatic but completely ambulatory  Vitals:   07/21/19 0856  BP: 114/76  Pulse: 61  Resp: 18  Temp: (!) 97.2 F (36.2 C)  SpO2: 94%   Wt Readings from Last 3 Encounters:  07/21/19 200 lb 6.4 oz (90.9 kg)  06/23/19 203 lb (92.1 kg)  06/16/19 208 lb (94.3 kg)   Physical Exam Vitals reviewed.  Constitutional:      Appearance: Normal appearance.  Cardiovascular:      Rate and Rhythm: Normal rate and regular rhythm.     Pulses: Normal pulses.  Heart sounds: Normal heart sounds.  Pulmonary:     Effort: Pulmonary effort is normal.     Breath sounds: Normal breath sounds.  Musculoskeletal:     Right lower leg: Edema (1+) present.     Left lower leg: Edema (1+) present.     Comments: Port on R shoulder  Neurological:     General: No focal deficit present.     Mental Status: He is alert and oriented to person, place, and time.  Psychiatric:        Mood and Affect: Mood normal.        Behavior: Behavior normal.     LABORATORY DATA:  I have reviewed the labs as listed.  CBC Latest Ref Rng & Units 07/21/2019 06/23/2019 05/31/2019  WBC 4.0 - 10.5 K/uL 4.0 5.1 4.5  Hemoglobin 13.0 - 17.0 g/dL 13.2 12.7(L) 14.1  Hematocrit 39 - 52 % 39.6 39.6 43.5  Platelets 150 - 400 K/uL 110(L) 138(L) 136(L)   CMP Latest Ref Rng & Units 07/21/2019 06/23/2019 05/31/2019  Glucose 70 - 99 mg/dL 92 79 106(H)  BUN 8 - 23 mg/dL 26(H) 22 20  Creatinine 0.61 - 1.24 mg/dL 0.75 0.83 0.62  Sodium 135 - 145 mmol/L 138 140 139  Potassium 3.5 - 5.1 mmol/L 3.6 4.1 4.2  Chloride 98 - 111 mmol/L 102 105 106  CO2 22 - 32 mmol/L 27 25 22   Calcium 8.9 - 10.3 mg/dL 8.7(L) 8.9 8.8(L)  Total Protein 6.5 - 8.1 g/dL 5.4(L) 5.3(L) 5.5(L)  Total Bilirubin 0.3 - 1.2 mg/dL 0.7 0.8 0.6  Alkaline Phos 38 - 126 U/L 21(L) 19(L) 21(L)  AST 15 - 41 U/L 13(L) 17 16  ALT 0 - 44 U/L 11 14 14     DIAGNOSTIC IMAGING:  I have independently reviewed the scans and discussed with the patient. No results found.   ASSESSMENT:  1.  Recurrent IgG kappa multiple myeloma: On monthly daratumumab and prednisone. -MRI of the thoracic spine showed no myeloma lesions.  Right first rib unchanged.  Left rib lesion has improved.  Left sacral ala vertical fracture present.  Spinal stenosis at L3-L4.  Compression fracture of L2 and L4.   PLAN:  1.  IgG kappa myeloma: -We reviewed myeloma panel from 06/23/2019.  Free  light chain ratio has slightly gone up to 5.03.  M spike is 0.2 g. -I have recommended proceeding with his next Darzalex injection.  He will continue dexamethasone 10 mg weekly. -RTC 4 weeks with labs and treatment.  2.  Low back pain: -Continue oxycodone 10 mg 3 times a day.  He reports improvement in pain.  3.  Peripheral neuropathy: -Continue gabapentin 3 times a day.  4.  Bone protection: -Continue Zometa monthly.  Creatinine and calcium are normal.  5.  Sleeping difficulty: -He has tried multiple drugs in the past.  He is taking Xanax and gabapentin together which is helping.  6.  Hypokalemia: -Potassium is 3.6.  Continue supplements.   Orders placed this encounter:  No orders of the defined types were placed in this encounter.    Derek Jack, MD [place] [phone]   I, Milinda Antis, am acting as a scribe for Dr. Sanda Linger.  I, Derek Jack MD, have reviewed the above documentation for accuracy and completeness, and I agree with the above.

## 2019-07-21 NOTE — Patient Instructions (Signed)
[facility] at [facility] Discharge Instructions  You were seen today by Dr. Katragadda. He went over your recent results. Dr. Katragadda will see you back in 4 weeks for labs and follow up.   Thank you for choosing [facility] at [facility] to provide your oncology and hematology care.  To afford each patient quality time with our provider, please arrive at least 15 minutes before your scheduled appointment time.   If you have a lab appointment with the Cancer Center please come in thru the Main Entrance and check in at the main information desk  You need to re-schedule your appointment should you arrive 10 or more minutes late.  We strive to give you quality time with our providers, and arriving late affects you and other patients whose appointments are after yours.  Also, if you no show three or more times for appointments you may be dismissed from the clinic at the providers discretion.     Again, thank you for choosing [facility].  Our hope is that these requests will decrease the amount of time that you wait before being seen by our physicians.       _____________________________________________________________  Should you have questions after your visit to [facility], please contact our office at [phone] between the hours of 8:00 a.m. and 4:30 p.m.  Voicemails left after 4:00 p.m. will not be returned until the following business day.  For prescription refill requests, have your pharmacy contact our office and allow 72 hours.    Cancer Center Support Programs:   > Cancer Support Group  2nd Tuesday of the month 1pm-2pm, Journey Room    

## 2019-07-21 NOTE — Progress Notes (Signed)
Patient has been assessed, vital signs and labs have been reviewed by Dr. Katragadda. ANC, Creatinine, LFTs, and Platelets are within treatment parameters per Dr. Katragadda. The patient is good to proceed with treatment at this time.  

## 2019-07-21 NOTE — Progress Notes (Signed)
Patient tolerated Darzalex injection with no complaints voiced.  Patient tolerated Zometa infusion with no complaints.  Sites clean and dry with no bruising or swelling noted at site.  Band aids applied.  Vss with discharge and left by wheelchair with no s/s of distress noted.

## 2019-07-22 LAB — PROTEIN ELECTROPHORESIS, SERUM
A/G Ratio: 1.5 (ref 0.7–1.7)
Albumin ELP: 2.9 g/dL (ref 2.9–4.4)
Alpha-1-Globulin: 0.2 g/dL (ref 0.0–0.4)
Alpha-2-Globulin: 0.6 g/dL (ref 0.4–1.0)
Beta Globulin: 0.8 g/dL (ref 0.7–1.3)
Gamma Globulin: 0.3 g/dL — ABNORMAL LOW (ref 0.4–1.8)
Globulin, Total: 1.9 g/dL — ABNORMAL LOW (ref 2.2–3.9)
M-Spike, %: 0.1 g/dL — ABNORMAL HIGH
Total Protein ELP: 4.8 g/dL — ABNORMAL LOW (ref 6.0–8.5)

## 2019-07-22 LAB — KAPPA/LAMBDA LIGHT CHAINS
Kappa free light chain: 19.9 mg/L — ABNORMAL HIGH (ref 3.3–19.4)
Kappa, lambda light chain ratio: 3.32 — ABNORMAL HIGH (ref 0.26–1.65)
Lambda free light chains: 6 mg/L (ref 5.7–26.3)

## 2019-08-18 ENCOUNTER — Inpatient Hospital Stay (HOSPITAL_COMMUNITY): Payer: Medicare Other | Attending: Hematology

## 2019-08-18 ENCOUNTER — Inpatient Hospital Stay (HOSPITAL_BASED_OUTPATIENT_CLINIC_OR_DEPARTMENT_OTHER): Payer: Medicare Other | Admitting: Hematology

## 2019-08-18 ENCOUNTER — Encounter (HOSPITAL_COMMUNITY): Payer: Self-pay | Admitting: Hematology

## 2019-08-18 ENCOUNTER — Other Ambulatory Visit: Payer: Self-pay

## 2019-08-18 ENCOUNTER — Inpatient Hospital Stay (HOSPITAL_COMMUNITY): Payer: Medicare Other

## 2019-08-18 VITALS — BP 106/70 | HR 55 | Temp 97.6°F | Resp 18 | Wt 197.2 lb

## 2019-08-18 DIAGNOSIS — C9000 Multiple myeloma not having achieved remission: Secondary | ICD-10-CM | POA: Diagnosis not present

## 2019-08-18 DIAGNOSIS — Z5112 Encounter for antineoplastic immunotherapy: Secondary | ICD-10-CM | POA: Diagnosis not present

## 2019-08-18 LAB — CBC WITH DIFFERENTIAL/PLATELET
Abs Immature Granulocytes: 0.01 10*3/uL (ref 0.00–0.07)
Basophils Absolute: 0 10*3/uL (ref 0.0–0.1)
Basophils Relative: 0 %
Eosinophils Absolute: 0 10*3/uL (ref 0.0–0.5)
Eosinophils Relative: 1 %
HCT: 41.9 % (ref 39.0–52.0)
Hemoglobin: 13.5 g/dL (ref 13.0–17.0)
Immature Granulocytes: 0 %
Lymphocytes Relative: 27 %
Lymphs Abs: 1.4 10*3/uL (ref 0.7–4.0)
MCH: 30.5 pg (ref 26.0–34.0)
MCHC: 32.2 g/dL (ref 30.0–36.0)
MCV: 94.6 fL (ref 80.0–100.0)
Monocytes Absolute: 0.5 10*3/uL (ref 0.1–1.0)
Monocytes Relative: 9 %
Neutro Abs: 3.3 10*3/uL (ref 1.7–7.7)
Neutrophils Relative %: 63 %
Platelets: 111 10*3/uL — ABNORMAL LOW (ref 150–400)
RBC: 4.43 MIL/uL (ref 4.22–5.81)
RDW: 13.3 % (ref 11.5–15.5)
WBC: 5.2 10*3/uL (ref 4.0–10.5)
nRBC: 0 % (ref 0.0–0.2)

## 2019-08-18 LAB — LACTATE DEHYDROGENASE: LDH: 117 U/L (ref 98–192)

## 2019-08-18 LAB — COMPREHENSIVE METABOLIC PANEL
ALT: 13 U/L (ref 0–44)
AST: 14 U/L — ABNORMAL LOW (ref 15–41)
Albumin: 3.5 g/dL (ref 3.5–5.0)
Alkaline Phosphatase: 20 U/L — ABNORMAL LOW (ref 38–126)
Anion gap: 4 — ABNORMAL LOW (ref 5–15)
BUN: 21 mg/dL (ref 8–23)
CO2: 30 mmol/L (ref 22–32)
Calcium: 8.4 mg/dL — ABNORMAL LOW (ref 8.9–10.3)
Chloride: 106 mmol/L (ref 98–111)
Creatinine, Ser: 0.66 mg/dL (ref 0.61–1.24)
GFR calc Af Amer: >60 mL/min (ref >60)
GFR calc non Af Amer: >60 mL/min (ref >60)
Glucose, Bld: 85 mg/dL (ref 70–99)
Potassium: 4.1 mmol/L (ref 3.5–5.1)
Sodium: 140 mmol/L (ref 135–145)
Total Bilirubin: 0.5 mg/dL (ref 0.3–1.2)
Total Protein: 5.4 g/dL — ABNORMAL LOW (ref 6.5–8.1)

## 2019-08-18 LAB — MAGNESIUM: Magnesium: 2.2 mg/dL (ref 1.7–2.4)

## 2019-08-18 MED ORDER — DEXAMETHASONE 4 MG PO TABS
20.0000 mg | ORAL_TABLET | Freq: Once | ORAL | Status: AC
  Administered 2019-08-18: 20 mg via ORAL
  Filled 2019-08-18: qty 5

## 2019-08-18 MED ORDER — HEPARIN SOD (PORK) LOCK FLUSH 100 UNIT/ML IV SOLN
500.0000 [IU] | Freq: Once | INTRAVENOUS | Status: AC | PRN
  Administered 2019-08-18: 500 [IU]

## 2019-08-18 MED ORDER — PROCHLORPERAZINE MALEATE 10 MG PO TABS
10.0000 mg | ORAL_TABLET | Freq: Once | ORAL | Status: AC
  Administered 2019-08-18: 10 mg via ORAL
  Filled 2019-08-18: qty 1

## 2019-08-18 MED ORDER — ACETAMINOPHEN 325 MG PO TABS
650.0000 mg | ORAL_TABLET | Freq: Once | ORAL | Status: AC
  Administered 2019-08-18: 650 mg via ORAL
  Filled 2019-08-18: qty 2

## 2019-08-18 MED ORDER — DARATUMUMAB-HYALURONIDASE-FIHJ 1800-30000 MG-UT/15ML ~~LOC~~ SOLN
1800.0000 mg | Freq: Once | SUBCUTANEOUS | Status: AC
  Administered 2019-08-18: 1800 mg via SUBCUTANEOUS
  Filled 2019-08-18: qty 15

## 2019-08-18 MED ORDER — SODIUM CHLORIDE 0.9% FLUSH
10.0000 mL | INTRAVENOUS | Status: DC | PRN
  Administered 2019-08-18: 10 mL

## 2019-08-18 MED ORDER — DIPHENHYDRAMINE HCL 25 MG PO CAPS
50.0000 mg | ORAL_CAPSULE | Freq: Once | ORAL | Status: AC
  Administered 2019-08-18: 50 mg via ORAL
  Filled 2019-08-18: qty 2

## 2019-08-18 NOTE — Progress Notes (Signed)
Patient has been assessed, vital signs and labs have been reviewed by Dr. Katragadda. ANC, Creatinine, LFTs, and Platelets are within treatment parameters per Dr. Katragadda. The patient is good to proceed with treatment at this time.  

## 2019-08-18 NOTE — Patient Instructions (Signed)
[facility] Discharge Instructions for Patients Receiving Chemotherapy   Beginning January 23rd 2017 lab work for the Cancer Center will be done in the  Main lab at [facility] on 1st floor. If you have a lab appointment with the Cancer Center please come in thru the  Main Entrance and check in at the main information desk   Today you received the following chemotherapy agents Daratumumab  To help prevent nausea and vomiting after your treatment, we encourage you to take your nausea medication   If you develop nausea and vomiting, or diarrhea that is not controlled by your medication, call the clinic.  The clinic phone number is [phone]. Office hours are Monday-Friday 8:30am-5:00pm.  BELOW ARE SYMPTOMS THAT SHOULD BE REPORTED IMMEDIATELY:  *FEVER GREATER THAN 101.0 F  *CHILLS WITH OR WITHOUT FEVER  NAUSEA AND VOMITING THAT IS NOT CONTROLLED WITH YOUR NAUSEA MEDICATION  *UNUSUAL SHORTNESS OF BREATH  *UNUSUAL BRUISING OR BLEEDING  TENDERNESS IN MOUTH AND THROAT WITH OR WITHOUT PRESENCE OF ULCERS  *URINARY PROBLEMS  *BOWEL PROBLEMS  UNUSUAL RASH Items with * indicate a potential emergency and should be followed up as soon as possible. If you have an emergency after office hours please contact your primary care physician or go to the nearest emergency department.  Please call the clinic during office hours if you have any questions or concerns.   You may also contact the Patient Navigator at [phone] should you have any questions or need assistance in obtaining follow up care.      Resources For Cancer Patients and their Caregivers ? American Cancer Society: Can assist with transportation, wigs, general needs, runs Look Good Feel Better.        1-888-227-6333 ? Cancer Care: Provides financial assistance, online support groups, medication/co-pay assistance.  1-800-813-HOPE (4673) ? [facility] Assists [place]  cancer patients and their families through emotional , educational and financial support.  336-427-4357 ? [place] DSS Where to apply for food stamps, Medicaid and utility assistance. 336-342-1394 ? [facility]: Transportation to medical appointments. 336-347-2287 ? Social Security Administration: May apply for disability if have a Stage IV cancer. 336-342-7796 1-800-772-1213 ? [place] Aging, Disability and Transit Services: Assists with nutrition, care and transit needs. 336-349-2343          

## 2019-08-18 NOTE — Progress Notes (Signed)
[place], [place] [address]   CLINIC:  Medical Oncology/Hematology  PCP:  [name], MD [address] [place] [address] [phone]   REASON FOR VISIT:  Follow-up for IgG kappa multiple myeloma  CURRENT THERAPY: Darzalex Faspro  BRIEF ONCOLOGIC HISTORY:  Oncology History  Multiple myeloma without remission ([place])  11/16/2012 Initial Diagnosis   L occipital condyle destructive lesion, not biopsied secondary to location. XRT   12/01/2012 Imaging   L3 superior endplate compression FX, no lytic lesions to suggest myeloma   02/06/2014 Imaging   soft tissue mass along the right posterior fifth rib with some rib destruction   02/16/2014 Miscellaneous   Normal CBC, Normal CMP, kappa,lamda ratio of 2.62 (abnormal), UIEP with slightly restricted band, IgG kappa, SPEP/IEP with monoclonal protein at 0.79 g/dl, normal igG, suppressed IGM   02/20/2014 Bone Marrow Biopsy   33% kappa restricted plasma cells Normal FISH, normal cytogenetics   02/21/2014 - 03/08/2014 Radiation Therapy   30Gy to R rib lesion, lesion not biopsied   03/16/2014 PET scan   Scattered hypermetabolic osseous lesions, including the calvarium, ribs, left scapula, sacrum, and left femoral shaft. An expansile left vertebral body lesion at L3 extends into the epidural space of the left lateral recess,    03/17/2014 - 04/07/2014 Chemotherapy   Velcade and Dexamethasone due to initial denial of Revlimid by insurance company.  Zometa monthly.   03/22/2014 Imaging   MR_ L-Spine- Enhancing lesions compatible with multiple myeloma at L2, L3, and S1-2.   04/07/2014 - 07/28/2014 Chemotherapy   RVD with Revlimid at 25 mg days 1-14.  Revlimid dose reduced to 25 mg every other day x 14 days with 7 day respite beginning on day 1 of cycle 2.   04/17/2014 Adverse Reaction   Revlimid-induced rash.  Treated with steroids.   07/28/2014 - 08/04/2014 Chemotherapy   Revlimid daily     08/04/2014 Adverse Reaction   Velcade-induced peripheral neuropathy   08/04/2014 Treatment Plan Change   D/C Velcade   08/11/2014 PET scan   Response to therapy. Improvement and resolution of foci of osseous hypermetabolism.   08/22/2014 - 08/28/2014 Chemotherapy   Revlimid 25 mg days 1-21 every 28 days   08/28/2014 Adverse Reaction   Revlimid-induced rash. Medication held.  Medrol dose Pak prescribed.   09/04/2014 - 01/08/2015 Chemotherapy   Revlimid 10 mg every other day, without dexamethasone   01/08/2015 - 01/15/2015 Hospital Admission   Colonic diverticular abscess with IR drain placement   03/02/2015 PET scan   Only bony uptake is low activity at site of deformity/callus at R second rib FX, high activity in sigmoid colon, advanced sigmoid divertidulosis. Abscess not resolved?   06/04/2015 Imaging   CT nonobstructive L renal calculus, diverticulosis of descending and sigmoid colon without inflammation, moderate prostatic enlargement   07/12/2015 Surgery   Diverticulitis s/p robotic sigmoid colectomy with Dr. Johney Maine   08/23/2015 PET scan   Primarily similar hypermetabolic osseous foci within the R sided ribs. new L third rib focus of hypermetab and sclerosis is favored to be related to healing FX. no soft tissue myeloma id'd. Presumed postop hypermetab and edema about the R pelvic wall    03/06/2016 PET scan   1. Reduced activity in the prior lesion such as the right second and fifth rib lesions, activity nearly completely resolved and significantly less than added mediastinal blood pool activity. No new lesions are identified. 2. Other imaging findings  of potential clinical significance: Coronary, aortic arch, and branch vessel atherosclerotic vascular disease. Aortoiliac atherosclerotic vascular disease. Enlarged prostate gland. Colonic diverticula.   02/17/2017 PET scan   HEAD/NECK: No hypermetabolic activity in the scalp. No hypermetabolic cervical lymph nodes. CHEST: No  hypermetabolic mediastinal or hilar nodes. Right upper lobe scarring/atelectasis. No suspicious pulmonary nodules on the CT scan. ABDOMEN/PELVIS: No abnormal hypermetabolic activity within the liver, pancreas, adrenal glands, or spleen. No hypermetabolic lymph nodes in the abdomen or pelvis. Atherosclerotic calcifications of the abdominal aorta and branch vessels. Left renal sinus cysts. Sigmoid diverticulosis, without evidence of diverticulitis. Prostatomegaly. SKELETON: Vague hypermetabolism involving the inferior sternum, max SUV 3.2, previously 8.2. Focal hypermetabolism involving the left sacrum, max SUV 4.0, previously 6.7. EXTREMITIES: No abnormal hypermetabolic activity in the lower extremities.   02/18/2017 -  Chemotherapy   Bortezomib 1.78m/m2 QWk + Dexamethasone 135mQTue/Fri --Cycle #1, 02/18/17    02/18/2017 - 08/26/2017 Chemotherapy   The patient had bortezomib SQ (VELCADE) chemo injection 3 mg, 1.3 mg/m2 = 3 mg, Subcutaneous,  Once, 7 of 9 cycles Administration: 3 mg (02/18/2017), 3 mg (02/26/2017), 3 mg (03/04/2017), 3 mg (03/11/2017), 3 mg (04/08/2017), 3 mg (03/18/2017), 3 mg (03/25/2017), 3 mg (04/01/2017), 3 mg (05/06/2017), 3 mg (05/20/2017), 3 mg (06/03/2017), 3 mg (06/17/2017), 3 mg (07/01/2017), 3 mg (07/15/2017), 3 mg (07/29/2017), 3 mg (08/12/2017), 3 mg (08/26/2017)  for chemotherapy treatment.    09/17/2017 -  Chemotherapy   The patient had dexamethasone (DECADRON) tablet 20 mg, 20 mg (100 % of original dose 20 mg), Oral,  Once, 5 of 6 cycles Dose modification: 20 mg (original dose 20 mg, Cycle 17) Administration: 20 mg (01/17/2019), 20 mg (02/16/2019), 20 mg (03/16/2019), 20 mg (06/23/2019), 20 mg (07/21/2019) daratumumab-hyaluronidase-fihj (DARZALEX FASPRO) 1800-30000 MG-UT/15ML chemo SQ injection 1,800 mg, 1,800 mg, Subcutaneous,  Once, 8 of 9 cycles Administration: 1,800 mg (10/25/2018), 1,800 mg (11/22/2018), 1,800 mg (12/20/2018), 1,800 mg (01/17/2019), 1,800 mg (02/16/2019), 1,800 mg  (03/16/2019), 1,800 mg (06/23/2019), 1,800 mg (07/21/2019) daratumumab (DARZALEX) 1,800 mg in sodium chloride 0.9 % 910 mL (1.8 mg/mL) chemo infusion, 15.9 mg/kg = 1,820 mg, Intravenous, Once, 1 of 1 cycle Administration: 1,800 mg (09/17/2017) daratumumab (DARZALEX) 1,800 mg in sodium chloride 0.9 % 410 mL (3.6 mg/mL) chemo infusion, 1,820 mg, Intravenous, Once, 13 of 13 cycles Administration: 1,800 mg (10/28/2017), 1,800 mg (11/04/2017), 1,800 mg (11/11/2017), 1,800 mg (11/18/2017), 1,800 mg (11/25/2017), 1,800 mg (12/02/2017), 1,800 mg (12/09/2017), 1,800 mg (12/16/2017), 1,800 mg (12/30/2017), 1,800 mg (04/26/2018), 1,800 mg (01/13/2018), 1,800 mg (02/12/2018), 1,800 mg (02/26/2018), 1,800 mg (03/15/2018), 1,800 mg (03/29/2018), 1,800 mg (04/12/2018), 1,800 mg (05/10/2018), 1,800 mg (06/07/2018), 1,800 mg (07/05/2018), 1,800 mg (08/02/2018), 1,800 mg (08/30/2018), 1,800 mg (09/27/2018)  for chemotherapy treatment.      CANCER STAGING: Cancer Staging No matching staging information was found for the patient.  INTERVAL HISTORY:  [name] a 89.o. male, returns for routine follow-up and consideration for next cycle of chemotherapy. Dean [name] last seen on 07/21/2019.  Due for cycle #22 of Darzalex Faspro today.   Overall, today he tells me he has been feeling pretty well. He does not take the oxycodone every day, though he took 1 tablet this morning for his hip pain. He takes gabapentin daily. His appetite is fair and he is trying to eat. He is taking the Lasix and denies having any swelling.  Overall, he feels ready for next cycle of chemo today.    REVIEW OF SYSTEMS:  Review  of Systems  Constitutional: Positive for appetite change (severely decreased) and fatigue (severe).  Musculoskeletal: Positive for arthralgias (hip pain).  Hematological: Bruises/bleeds easily (easy bruising).  All other systems reviewed and are negative.   PAST MEDICAL/SURGICAL HISTORY:  Past Medical History:   Diagnosis Date  . BPH (benign prostatic hyperplasia)   . Colonic diverticular abscess 01/08/2015  . Diverticulitis 56/97/94   complicated by abscess and required percutaneous drainage  . GERD (gastroesophageal reflux disease)   . H/O ETOH abuse   . History of chemotherapy last done jan 2017  . History of radiation therapy 01/05/13-02/10/13   45 gray to left occipital condyle region  . History of radiation therapy 12/01/16-12/10/16   Parasternal nodule, chest- 24 Gy total delivered in 8 fractions, Left sacro-iliac, pelvis- 24 Gy total delivered in 8 fractions   . History of radiation therapy 02/19/17-02/22/17   right temporal scalp 30 Gy in 10 fractions  . Intra-abdominal abscess ([place])   . Multiple myeloma ([place]) 2015  . Neuropathy   . Radiation 02/21/14-03/08/14   right posterior chest wall area 30 gray  . Radiation 04/19/14-05/02/14   lumbar spine 25 gray  . Skull lesion    Left occipital condyle  . Wrist fracture, left    x 2   Past Surgical History:  Procedure Laterality Date  . COLONOSCOPY N/A 04/19/2015   Procedure: COLONOSCOPY;  Surgeon: Danie Binder, MD;  Location: [facility];  Service: Endoscopy;  Laterality: N/A;  1:30 PM  . CYST EXCISION  1959   tail bone  . IR IMAGING GUIDED PORT INSERTION  09/25/2017    SOCIAL HISTORY:  Social History   Socioeconomic History  . Marital status: Married    Spouse name: Not on file  . Number of children: 3  . Years of education: Not on file  . Highest education level: Not on file  Occupational History    Employer: RETIRED  Tobacco Use  . Smoking status: Never Smoker  . Smokeless tobacco: Never Used  Vaping Use  . Vaping Use: Never used  Substance and Sexual Activity  . Alcohol use: No    Comment: " Not much no more"  . Drug use: No  . Sexual activity: Not on file  Other Topics Concern  . Not on file  Social History Narrative  . Not on file   Social Determinants of Health   Financial Resource Strain:   . Difficulty  of Paying Living Expenses:   Food Insecurity:   . Worried About Charity fundraiser in the Last Year:   . Arboriculturist in the Last Year:   Transportation Needs:   . Film/video editor (Medical):   Marland Kitchen Lack of Transportation (Non-Medical):   Physical Activity:   . Days of Exercise per Week:   . Minutes of Exercise per Session:   Stress:   . Feeling of Stress :   Social Connections:   . Frequency of Communication with Friends and Family:   . Frequency of Social Gatherings with Friends and Family:   . Attends Religious Services:   . Active Member of Clubs or Organizations:   . Attends Archivist Meetings:   Marland Kitchen Marital Status:   Intimate Partner Violence:   . Fear of Current or Ex-Partner:   . Emotionally Abused:   Marland Kitchen Physically Abused:   . Sexually Abused:     FAMILY HISTORY:  Family History  Problem Relation Age of Onset  . Diabetes Mother   .  Stroke Mother   . Hypertension Father     CURRENT MEDICATIONS:  Current Outpatient Medications  Medication Sig Dispense Refill  . acyclovir (ZOVIRAX) 400 MG tablet TAKE 1 TABLET BY MOUTH 2 TIMES DAILY 60 tablet 3  . ALPRAZolam (XANAX) 1 MG tablet Take 1 tablet (1 mg total) by mouth 3 (three) times daily as needed for anxiety. 60 tablet 3  . CALCIUM-VITAMIN D PO Take 1 tablet by mouth 2 (two) times daily.     Marland Kitchen dexamethasone (DECADRON) 4 MG tablet Take 2.5 tablets (10 mg total) by mouth once a week. Take 10 mg once weekly 10 tablet 12  . diclofenac Sodium (VOLTAREN) 1 % GEL Apply topically as needed. Pt has not picked up yet    . fluticasone (FLONASE) 50 MCG/ACT nasal spray Place 2 sprays into both nostrils daily. 16 g 6  . furosemide (LASIX) 20 MG tablet TAKE 1 TABLET BY MOUTH DAILY AS NEEDED FOR EDEMA. 30 tablet 0  . gabapentin (NEURONTIN) 400 MG capsule TAKE 1 CAPSULE BY MOUTH 3 TIMES DAILY 90 capsule 4  . Homeopathic Products (Mulat) Apply topically as needed.    . lidocaine-prilocaine (EMLA) cream Apply  small amount over port one (1) hour prior to appointment. 30 g 0  . MAGNESIUM PO Take 1 tablet by mouth at bedtime.    . montelukast (SINGULAIR) 10 MG tablet Take 1 tablet (10 mg total) by mouth at bedtime. 30 tablet 3  . omeprazole (PRILOSEC) 20 MG capsule TAKE 1 CAPSULE BY MOUTH ONCE DAILY 30 capsule 5  . Oxycodone HCl 10 MG TABS Take 1 tablet (10 mg total) by mouth every 8 (eight) hours as needed. for pain 120 tablet 0  . senna-docusate (SENOKOT-S) 8.6-50 MG tablet Take 1 tablet by mouth 2 (two) times daily. 60 tablet 5  . sulfamethoxazole-trimethoprim (BACTRIM DS) 800-160 MG tablet Take 1 tablet by mouth 2 (two) times daily. 42 tablet 0  . Suvorexant (BELSOMRA) 10 MG TABS Take 10 mg by mouth at bedtime as needed. 30 tablet 3  . tamsulosin (FLOMAX) 0.4 MG CAPS capsule TAKE ONE CAPSULE BY MOUTH EVERY NIGHT ATBEDTIME 30 capsule 11  . tiZANidine (ZANAFLEX) 4 MG tablet Take 1 tablet (4 mg total) by mouth every 6 (six) hours as needed (headache). 30 tablet 0  . traZODone (DESYREL) 100 MG tablet Take 1 tablet (100 mg total) by mouth at bedtime. May titrate up to 300 mg nightly. 60 tablet 3  . zolendronic acid (ZOMETA) 4 MG/5ML injection Inject 4 mg into the vein every 30 (thirty) days.      No current facility-administered medications for this visit.    ALLERGIES:  Allergies  Allergen Reactions  . Codeine Other (See Comments)    Headache  . Morphine And Related Other (See Comments)    Makes him feel weird  . Revlimid [Lenalidomide] Other (See Comments)    "Causes me to become weak"    PHYSICAL EXAM:  Performance status (ECOG): 1 - Symptomatic but completely ambulatory  Vitals:   08/18/19 0925  BP: 106/70  Pulse: (!) 55  Resp: 18  Temp: 97.6 F (36.4 C)  SpO2: 99%   Wt Readings from Last 3 Encounters:  08/18/19 197 lb 3.2 oz (89.4 kg)  07/21/19 200 lb 6.4 oz (90.9 kg)  06/23/19 203 lb (92.1 kg)   Physical Exam Vitals reviewed.  Constitutional:      Appearance: Normal  appearance.  Cardiovascular:     Rate and Rhythm: Normal rate  and regular rhythm.     Pulses: Normal pulses.     Heart sounds: Normal heart sounds.  Pulmonary:     Effort: Pulmonary effort is normal.     Breath sounds: Normal breath sounds.  Chest:     Comments: Port on R chest Abdominal:     Palpations: Abdomen is soft. There is no mass.     Tenderness: There is no abdominal tenderness.  Musculoskeletal:     Right lower leg: No edema.     Left lower leg: No edema.  Lymphadenopathy:     Upper Body:     Right upper body: No axillary adenopathy.     Left upper body: No axillary adenopathy.  Neurological:     General: No focal deficit present.     Mental Status: He is alert and oriented to person, place, and time.  Psychiatric:        Mood and Affect: Mood normal.        Behavior: Behavior normal.     LABORATORY DATA:  I have reviewed the labs as listed.  CBC Latest Ref Rng & Units 08/18/2019 07/21/2019 06/23/2019  WBC 4.0 - 10.5 K/uL 5.2 4.0 5.1  Hemoglobin 13.0 - 17.0 g/dL 13.5 13.2 12.7(L)  Hematocrit 39 - 52 % 41.9 39.6 39.6  Platelets 150 - 400 K/uL 111(L) 110(L) 138(L)   CMP Latest Ref Rng & Units 08/18/2019 07/21/2019 06/23/2019  Glucose 70 - 99 mg/dL 85 92 79  BUN 8 - 23 mg/dL 21 26(H) 22  Creatinine 0.61 - 1.24 mg/dL 0.66 0.75 0.83  Sodium 135 - 145 mmol/L 140 138 140  Potassium 3.5 - 5.1 mmol/L 4.1 3.6 4.1  Chloride 98 - 111 mmol/L 106 102 105  CO2 22 - 32 mmol/L 30 27 25   Calcium 8.9 - 10.3 mg/dL 8.4(L) 8.7(L) 8.9  Total Protein 6.5 - 8.1 g/dL 5.4(L) 5.4(L) 5.3(L)  Total Bilirubin 0.3 - 1.2 mg/dL 0.5 0.7 0.8  Alkaline Phos 38 - 126 U/L 20(L) 21(L) 19(L)  AST 15 - 41 U/L 14(L) 13(L) 17  ALT 0 - 44 U/L 13 11 14    Lab Results  Component Value Date   LDH 117 08/18/2019   LDH 119 07/21/2019   LDH 133 06/23/2019   Lab Results  Component Value Date   TOTALPROTELP 4.8 (L) 07/21/2019   ALBUMINELP 2.9 07/21/2019   A1GS 0.2 07/21/2019   A2GS 0.6 07/21/2019    BETS 0.8 07/21/2019   BETA2SER 5.3 04/07/2014   GAMS 0.3 (L) 07/21/2019   MSPIKE 0.1 (H) 07/21/2019   SPEI Comment 07/21/2019    Lab Results  Component Value Date   KPAFRELGTCHN 19.9 (H) 07/21/2019   LAMBDASER 6.0 07/21/2019   KAPLAMBRATIO 3.32 (H) 07/21/2019    DIAGNOSTIC IMAGING:  I have independently reviewed the scans and discussed with the patient. No results found.   ASSESSMENT:  1.  Recurrent IgG kappa multiple myeloma:  -On monthly daratumumab and prednisone. -MRI of the thoracic spine showed no myeloma lesions. Right first rib unchanged. Left rib lesion has improved. Left sacral ala vertical fracture present. Spinal stenosis at L3-L4. Compression fracture of L2 and L4.   PLAN:  1. IgG kappa myeloma: -Myeloma labs from 07/21/2019 shows M spike 0.1.  Free light chain ratio improved to 3.32.  Kappa light chains 19.9. -He will proceed with daratumumab today.  He will also receive 20 mg of dexamethasone.  I plan to see him back in 4 weeks with labs and treatment.  2.  Low back pain: -Continue oxycodone as needed.  Some days he does not need it.  Some days he takes 3 pills.  3. Peripheral neuropathy: -Continue gabapentin 3 times daily.  4. Bone protection: -Continue Zometa every 8 weeks.  5. Sleeping difficulty: -Continue Xanax with gabapentin.  6. Hypokalemia: -Potassium is 4.1.  Continue potassium supplements.   Orders placed this encounter:  No orders of the defined types were placed in this encounter.    Derek Jack, MD [facility] [phone]   I, Milinda Antis, am acting as a scribe for Dr. Sanda Linger.  I, Derek Jack MD, have reviewed the above documentation for accuracy and completeness, and I agree with the above.

## 2019-08-18 NOTE — Progress Notes (Signed)
Dean [name]. presents today for [phone] Daratumumab. Pt denies any new changes or symptoms since last treatment. Lab results and vitals have been reviewed and are stable and within parameters for treatment. Patient has been assessed by Dr. Delton Coombes who has approved proceeding with treatment today as planned.  Injection tolerated without incident or complaint. Port flushed and deaccessed per protocol, see MAR and IV flowsheet for details. Discharged in satisfactory condition with follow up instructions.

## 2019-08-18 NOTE — Patient Instructions (Signed)
[facility] at [facility] Discharge Instructions  You were seen today by Dr. Katragadda. He went over your recent results. You received your treatment today. Dr. Katragadda will see you back in 4 weeks for labs and follow up.   Thank you for choosing [facility] at [facility] to provide your oncology and hematology care.  To afford each patient quality time with our provider, please arrive at least 15 minutes before your scheduled appointment time.   If you have a lab appointment with the Cancer Center please come in thru the Main Entrance and check in at the main information desk  You need to re-schedule your appointment should you arrive 10 or more minutes late.  We strive to give you quality time with our providers, and arriving late affects you and other patients whose appointments are after yours.  Also, if you no show three or more times for appointments you may be dismissed from the clinic at the providers discretion.     Again, thank you for choosing [facility].  Our hope is that these requests will decrease the amount of time that you wait before being seen by our physicians.       _____________________________________________________________  Should you have questions after your visit to [facility], please contact our office at [phone] between the hours of 8:00 a.m. and 4:30 p.m.  Voicemails left after 4:00 p.m. will not be returned until the following business day.  For prescription refill requests, have your pharmacy contact our office and allow 72 hours.    Cancer Center Support Programs:   > Cancer Support Group  2nd Tuesday of the month 1pm-2pm, Journey Room    

## 2019-08-19 LAB — PROTEIN ELECTROPHORESIS, SERUM
A/G Ratio: 1.5 (ref 0.7–1.7)
Albumin ELP: 3.1 g/dL (ref 2.9–4.4)
Alpha-1-Globulin: 0.2 g/dL (ref 0.0–0.4)
Alpha-2-Globulin: 0.6 g/dL (ref 0.4–1.0)
Beta Globulin: 0.7 g/dL (ref 0.7–1.3)
Gamma Globulin: 0.5 g/dL (ref 0.4–1.8)
Globulin, Total: 2.1 g/dL — ABNORMAL LOW (ref 2.2–3.9)
M-Spike, %: 0.2 g/dL — ABNORMAL HIGH
Total Protein ELP: 5.2 g/dL — ABNORMAL LOW (ref 6.0–8.5)

## 2019-08-19 LAB — KAPPA/LAMBDA LIGHT CHAINS
Kappa free light chain: 18.6 mg/L (ref 3.3–19.4)
Kappa, lambda light chain ratio: 4.65 — ABNORMAL HIGH (ref 0.26–1.65)
Lambda free light chains: 4 mg/L — ABNORMAL LOW (ref 5.7–26.3)

## 2019-09-02 ENCOUNTER — Encounter (HOSPITAL_COMMUNITY): Payer: Medicare Other

## 2019-09-02 NOTE — Progress Notes (Signed)
Nutrition  Called patient for scheduled phone visit today and no answer.  Left message on voicemail with call back number.    Dean [name], [place], [place] Registered Dietitian [phone] (mobile)

## 2019-09-03 ENCOUNTER — Other Ambulatory Visit (HOSPITAL_COMMUNITY): Payer: Self-pay | Admitting: Hematology

## 2019-09-03 ENCOUNTER — Other Ambulatory Visit: Payer: Self-pay | Admitting: Family Medicine

## 2019-09-03 ENCOUNTER — Other Ambulatory Visit (HOSPITAL_COMMUNITY): Payer: Self-pay | Admitting: Nurse Practitioner

## 2019-09-03 DIAGNOSIS — G4700 Insomnia, unspecified: Secondary | ICD-10-CM

## 2019-09-03 DIAGNOSIS — C9000 Multiple myeloma not having achieved remission: Secondary | ICD-10-CM

## 2019-09-05 NOTE — Telephone Encounter (Signed)
Ok to refill??  Last office visit 06/16/2019.  Last refill 05/10/2019, #3 refills.

## 2019-09-06 ENCOUNTER — Other Ambulatory Visit (HOSPITAL_COMMUNITY): Payer: Self-pay | Admitting: *Deleted

## 2019-09-06 DIAGNOSIS — C9000 Multiple myeloma not having achieved remission: Secondary | ICD-10-CM

## 2019-09-06 MED ORDER — ACYCLOVIR 400 MG PO TABS: 400.0000 mg | ORAL_TABLET | Freq: Two times a day (BID) | ORAL | 3 refills | Status: DC

## 2019-09-15 ENCOUNTER — Inpatient Hospital Stay (HOSPITAL_COMMUNITY): Payer: Medicare Other | Attending: Hematology | Admitting: Hematology

## 2019-09-15 ENCOUNTER — Other Ambulatory Visit: Payer: Self-pay

## 2019-09-15 ENCOUNTER — Inpatient Hospital Stay (HOSPITAL_COMMUNITY): Payer: Medicare Other

## 2019-09-15 VITALS — BP 108/56 | HR 58 | Temp 97.2°F | Resp 18 | Wt 194.6 lb

## 2019-09-15 DIAGNOSIS — Z7952 Long term (current) use of systemic steroids: Secondary | ICD-10-CM

## 2019-09-15 DIAGNOSIS — Z79899 Other long term (current) drug therapy: Secondary | ICD-10-CM | POA: Diagnosis not present

## 2019-09-15 DIAGNOSIS — C9000 Multiple myeloma not having achieved remission: Secondary | ICD-10-CM | POA: Diagnosis not present

## 2019-09-15 DIAGNOSIS — G4700 Insomnia, unspecified: Secondary | ICD-10-CM

## 2019-09-15 DIAGNOSIS — E876 Hypokalemia: Secondary | ICD-10-CM | POA: Diagnosis not present

## 2019-09-15 DIAGNOSIS — M545 Low back pain: Secondary | ICD-10-CM | POA: Diagnosis not present

## 2019-09-15 DIAGNOSIS — Z5112 Encounter for antineoplastic immunotherapy: Secondary | ICD-10-CM | POA: Insufficient documentation

## 2019-09-15 DIAGNOSIS — G629 Polyneuropathy, unspecified: Secondary | ICD-10-CM

## 2019-09-15 DIAGNOSIS — Z923 Personal history of irradiation: Secondary | ICD-10-CM

## 2019-09-15 LAB — COMPREHENSIVE METABOLIC PANEL
ALT: 12 U/L (ref 0–44)
AST: 14 U/L — ABNORMAL LOW (ref 15–41)
Albumin: 3.5 g/dL (ref 3.5–5.0)
Alkaline Phosphatase: 18 U/L — ABNORMAL LOW (ref 38–126)
Anion gap: 9 (ref 5–15)
BUN: 20 mg/dL (ref 8–23)
CO2: 28 mmol/L (ref 22–32)
Calcium: 8.9 mg/dL (ref 8.9–10.3)
Chloride: 105 mmol/L (ref 98–111)
Creatinine, Ser: 0.66 mg/dL (ref 0.61–1.24)
GFR calc Af Amer: >60 mL/min (ref >60)
GFR calc non Af Amer: >60 mL/min (ref >60)
Glucose, Bld: 94 mg/dL (ref 70–99)
Potassium: 4 mmol/L (ref 3.5–5.1)
Sodium: 142 mmol/L (ref 135–145)
Total Bilirubin: 0.6 mg/dL (ref 0.3–1.2)
Total Protein: 5.4 g/dL — ABNORMAL LOW (ref 6.5–8.1)

## 2019-09-15 LAB — CBC WITH DIFFERENTIAL/PLATELET
Abs Immature Granulocytes: 0.01 10*3/uL (ref 0.00–0.07)
Basophils Absolute: 0 10*3/uL (ref 0.0–0.1)
Basophils Relative: 0 %
Eosinophils Absolute: 0.1 10*3/uL (ref 0.0–0.5)
Eosinophils Relative: 2 %
HCT: 40.9 % (ref 39.0–52.0)
Hemoglobin: 13.2 g/dL (ref 13.0–17.0)
Immature Granulocytes: 0 %
Lymphocytes Relative: 34 %
Lymphs Abs: 1.4 10*3/uL (ref 0.7–4.0)
MCH: 30.1 pg (ref 26.0–34.0)
MCHC: 32.3 g/dL (ref 30.0–36.0)
MCV: 93.4 fL (ref 80.0–100.0)
Monocytes Absolute: 0.5 10*3/uL (ref 0.1–1.0)
Monocytes Relative: 12 %
Neutro Abs: 2.1 10*3/uL (ref 1.7–7.7)
Neutrophils Relative %: 52 %
Platelets: 107 10*3/uL — ABNORMAL LOW (ref 150–400)
RBC: 4.38 MIL/uL (ref 4.22–5.81)
RDW: 13.9 % (ref 11.5–15.5)
WBC: 4 10*3/uL (ref 4.0–10.5)
nRBC: 0 % (ref 0.0–0.2)

## 2019-09-15 LAB — MAGNESIUM: Magnesium: 2 mg/dL (ref 1.7–2.4)

## 2019-09-15 MED ORDER — DEXAMETHASONE 4 MG PO TABS
20.0000 mg | ORAL_TABLET | Freq: Once | ORAL | Status: AC
  Administered 2019-09-15: 20 mg via ORAL
  Filled 2019-09-15: qty 5

## 2019-09-15 MED ORDER — SODIUM CHLORIDE 0.9 % IV SOLN: Freq: Once | INTRAVENOUS | Status: AC

## 2019-09-15 MED ORDER — ZOLEDRONIC ACID 4 MG/100ML IV SOLN
4.0000 mg | Freq: Once | INTRAVENOUS | Status: AC
  Administered 2019-09-15: 4 mg via INTRAVENOUS
  Filled 2019-09-15: qty 100

## 2019-09-15 MED ORDER — PROCHLORPERAZINE MALEATE 10 MG PO TABS
10.0000 mg | ORAL_TABLET | Freq: Once | ORAL | Status: AC
  Administered 2019-09-15: 10 mg via ORAL
  Filled 2019-09-15: qty 1

## 2019-09-15 MED ORDER — SODIUM CHLORIDE 0.9% FLUSH
10.0000 mL | INTRAVENOUS | Status: DC | PRN
  Administered 2019-09-15: 10 mL

## 2019-09-15 MED ORDER — DIPHENHYDRAMINE HCL 25 MG PO CAPS
50.0000 mg | ORAL_CAPSULE | Freq: Once | ORAL | Status: AC
  Administered 2019-09-15: 50 mg via ORAL
  Filled 2019-09-15: qty 2

## 2019-09-15 MED ORDER — ACETAMINOPHEN 325 MG PO TABS
650.0000 mg | ORAL_TABLET | Freq: Once | ORAL | Status: AC
  Administered 2019-09-15: 650 mg via ORAL
  Filled 2019-09-15: qty 2

## 2019-09-15 MED ORDER — DARATUMUMAB-HYALURONIDASE-FIHJ 1800-30000 MG-UT/15ML ~~LOC~~ SOLN
1800.0000 mg | Freq: Once | SUBCUTANEOUS | Status: AC
  Administered 2019-09-15: 1800 mg via SUBCUTANEOUS
  Filled 2019-09-15: qty 15

## 2019-09-15 MED ORDER — HEPARIN SOD (PORK) LOCK FLUSH 100 UNIT/ML IV SOLN
500.0000 [IU] | Freq: Once | INTRAVENOUS | Status: AC | PRN
  Administered 2019-09-15: 500 [IU]

## 2019-09-15 NOTE — Progress Notes (Signed)
Ok to treat verbal order Dr. Delton Coombes.   Patient tolerated Daratumumab injection and Zometa infusion with no complaints voiced.  Sites clean and dry with no bruising or swelling noted at site.  Port flushed easily with no complaints of pain.  Band aids applied.  Vss with discharge and left in satisfactory condition with no s/s of distress noted.

## 2019-09-15 NOTE — Patient Instructions (Signed)
[facility] at [facility] Discharge Instructions  You were seen today by Dr. Delton Coombes. He went over your recent results. You received your treatment today. Continue taking furosemide every day for your swelling. Dr. Delton Coombes will see you back in 4 weeks for labs and follow up.   Thank you for choosing [place] at [facility] to provide your oncology and hematology care.  To afford each patient quality time with our provider, please arrive at least 15 minutes before your scheduled appointment time.   If you have a lab appointment with the [facility] please come in thru the Main Entrance and check in at the main information desk  You need to re-schedule your appointment should you arrive 10 or more minutes late.  We strive to give you quality time with our providers, and arriving late affects you and other patients whose appointments are after yours.  Also, if you no show three or more times for appointments you may be dismissed from the clinic at the providers discretion.     Again, thank you for choosing [facility]  Our hope is that these requests will decrease the amount of time that you wait before being seen by our physicians.       _____________________________________________________________  Should you have questions after your visit to [facility], please contact our office at [phone] between the hours of 8:00 a.m. and 4:30 p.m.  Voicemails left after 4:00 p.m. will not be returned until the following business day.  For prescription refill requests, have your pharmacy contact our office and allow 72 hours.    Cancer Center Support Programs:   > Cancer Support Group  2nd Tuesday of the month 1pm-2pm, Journey Room

## 2019-09-15 NOTE — Progress Notes (Signed)
Dean [name],  [id number]   CLINIC:  Medical Oncology/Hematology  PCP:  [name], MD [address] [place] [address] [phone]   REASON FOR VISIT:  Follow-up for IgG kappa multiple myeloma  PRIOR THERAPY:  1. Bortezomib x 6 cycles from 04/07/2014 to 07/28/2014; x 7 cycles from 02/18/2017 to 08/26/2017.  NGS Results: Not done  CURRENT THERAPY: Darzalex Faspro every 4 weeks  BRIEF ONCOLOGIC HISTORY:  Oncology History  Multiple myeloma without remission ([place])  11/16/2012 Initial Diagnosis   L occipital condyle destructive lesion, not biopsied secondary to location. XRT   12/01/2012 Imaging   L3 superior endplate compression FX, no lytic lesions to suggest myeloma   02/06/2014 Imaging   soft tissue mass along the right posterior fifth rib with some rib destruction   02/16/2014 Miscellaneous   Normal CBC, Normal CMP, kappa,lamda ratio of 2.62 (abnormal), UIEP with slightly restricted band, IgG kappa, SPEP/IEP with monoclonal protein at 0.79 g/dl, normal igG, suppressed IGM   02/20/2014 Bone Marrow Biopsy   33% kappa restricted plasma cells Normal FISH, normal cytogenetics   02/21/2014 - 03/08/2014 Radiation Therapy   30Gy to R rib lesion, lesion not biopsied   03/16/2014 PET scan   Scattered hypermetabolic osseous lesions, including the calvarium, ribs, left scapula, sacrum, and left femoral shaft. An expansile left vertebral body lesion at L3 extends into the epidural space of the left lateral recess,    03/17/2014 - 04/07/2014 Chemotherapy   Velcade and Dexamethasone due to initial denial of Revlimid by insurance company.  Zometa monthly.   03/22/2014 Imaging   MR_ L-Spine- Enhancing lesions compatible with multiple myeloma at L2, L3, and S1-2.   04/07/2014 - 07/28/2014 Chemotherapy   RVD with Revlimid at 25 mg days 1-14.  Revlimid dose reduced to 25 mg every other day x 14 days with 7 day respite beginning on day  1 of cycle 2.   04/17/2014 Adverse Reaction   Revlimid-induced rash.  Treated with steroids.   07/28/2014 - 08/04/2014 Chemotherapy   Revlimid daily   08/04/2014 Adverse Reaction   Velcade-induced peripheral neuropathy   08/04/2014 Treatment Plan Change   D/C Velcade   08/11/2014 PET scan   Response to therapy. Improvement and resolution of foci of osseous hypermetabolism.   08/22/2014 - 08/28/2014 Chemotherapy   Revlimid 25 mg days 1-21 every 28 days   08/28/2014 Adverse Reaction   Revlimid-induced rash. Medication held.  Medrol dose Pak prescribed.   09/04/2014 - 01/08/2015 Chemotherapy   Revlimid 10 mg every other day, without dexamethasone   01/08/2015 - 01/15/2015 Hospital Admission   Colonic diverticular abscess with IR drain placement   03/02/2015 PET scan   Only bony uptake is low activity at site of deformity/callus at R second rib FX, high activity in sigmoid colon, advanced sigmoid divertidulosis. Abscess not resolved?   06/04/2015 Imaging   CT nonobstructive L renal calculus, diverticulosis of descending and sigmoid colon without inflammation, moderate prostatic enlargement   07/12/2015 Surgery   Diverticulitis s/p robotic sigmoid colectomy with Dr. Johney Maine   08/23/2015 PET scan   Primarily similar hypermetabolic osseous foci within the R sided ribs. new L third rib focus of hypermetab and sclerosis is favored to be related to healing FX. no soft tissue myeloma id'd. Presumed postop hypermetab and edema about the R pelvic wall    03/06/2016 PET scan   1. Reduced activity in the prior lesion such as the right  second and fifth rib lesions, activity nearly completely resolved and significantly less than added mediastinal blood pool activity. No new lesions are identified. 2. Other imaging findings of potential clinical significance: Coronary, aortic arch, and branch vessel atherosclerotic vascular disease. Aortoiliac atherosclerotic vascular disease. Enlarged prostate gland. Colonic  diverticula.   02/17/2017 PET scan   HEAD/NECK: No hypermetabolic activity in the scalp. No hypermetabolic cervical lymph nodes. CHEST: No hypermetabolic mediastinal or hilar nodes. Right upper lobe scarring/atelectasis. No suspicious pulmonary nodules on the CT scan. ABDOMEN/PELVIS: No abnormal hypermetabolic activity within the liver, pancreas, adrenal glands, or spleen. No hypermetabolic lymph nodes in the abdomen or pelvis. Atherosclerotic calcifications of the abdominal aorta and branch vessels. Left renal sinus cysts. Sigmoid diverticulosis, without evidence of diverticulitis. Prostatomegaly. SKELETON: Vague hypermetabolism involving the inferior sternum, max SUV 3.2, previously 8.2. Focal hypermetabolism involving the left sacrum, max SUV 4.0, previously 6.7. EXTREMITIES: No abnormal hypermetabolic activity in the lower extremities.   02/18/2017 -  Chemotherapy   Bortezomib 1.11m/m2 QWk + Dexamethasone 142mQTue/Fri --Cycle #1, 02/18/17    02/18/2017 - 08/26/2017 Chemotherapy   The patient had bortezomib SQ (VELCADE) chemo injection 3 mg, 1.3 mg/m2 = 3 mg, Subcutaneous,  Once, 7 of 9 cycles Administration: 3 mg (02/18/2017), 3 mg (02/26/2017), 3 mg (03/04/2017), 3 mg (03/11/2017), 3 mg (04/08/2017), 3 mg (03/18/2017), 3 mg (03/25/2017), 3 mg (04/01/2017), 3 mg (05/06/2017), 3 mg (05/20/2017), 3 mg (06/03/2017), 3 mg (06/17/2017), 3 mg (07/01/2017), 3 mg (07/15/2017), 3 mg (07/29/2017), 3 mg (08/12/2017), 3 mg (08/26/2017)  for chemotherapy treatment.    09/17/2017 -  Chemotherapy   The patient had dexamethasone (DECADRON) tablet 20 mg, 20 mg (100 % of original dose 20 mg), Oral,  Once, 6 of 10 cycles Dose modification: 20 mg (original dose 20 mg, Cycle 17) Administration: 20 mg (01/17/2019), 20 mg (02/16/2019), 20 mg (03/16/2019), 20 mg (06/23/2019), 20 mg (07/21/2019), 20 mg (08/18/2019) daratumumab-hyaluronidase-fihj (DARZALEX FASPRO) 1800-30000 MG-UT/15ML chemo SQ injection 1,800 mg, 1,800 mg, Subcutaneous,  Once,  9 of 13 cycles Administration: 1,800 mg (10/25/2018), 1,800 mg (11/22/2018), 1,800 mg (12/20/2018), 1,800 mg (01/17/2019), 1,800 mg (02/16/2019), 1,800 mg (03/16/2019), 1,800 mg (06/23/2019), 1,800 mg (07/21/2019), 1,800 mg (08/18/2019) daratumumab (DARZALEX) 1,800 mg in sodium chloride 0.9 % 910 mL (1.8 mg/mL) chemo infusion, 15.9 mg/kg = 1,820 mg, Intravenous, Once, 1 of 1 cycle Administration: 1,800 mg (09/17/2017) daratumumab (DARZALEX) 1,800 mg in sodium chloride 0.9 % 410 mL (3.6 mg/mL) chemo infusion, 1,820 mg, Intravenous, Once, 13 of 13 cycles Administration: 1,800 mg (10/28/2017), 1,800 mg (11/04/2017), 1,800 mg (11/11/2017), 1,800 mg (11/18/2017), 1,800 mg (11/25/2017), 1,800 mg (12/02/2017), 1,800 mg (12/09/2017), 1,800 mg (12/16/2017), 1,800 mg (12/30/2017), 1,800 mg (04/26/2018), 1,800 mg (01/13/2018), 1,800 mg (02/12/2018), 1,800 mg (02/26/2018), 1,800 mg (03/15/2018), 1,800 mg (03/29/2018), 1,800 mg (04/12/2018), 1,800 mg (05/10/2018), 1,800 mg (06/07/2018), 1,800 mg (07/05/2018), 1,800 mg (08/02/2018), 1,800 mg (08/30/2018), 1,800 mg (09/27/2018)  for chemotherapy treatment.      CANCER STAGING: Cancer Staging No matching staging information was found for the patient.  INTERVAL HISTORY:  [name] a [age].o. male, returns for routine follow-up and consideration for next cycle of chemotherapy. Dean [name] last seen on 08/18/2019.  Due for cycle #23 of Darzalex Faspro today.   Overall, today he tells me he has been feeling fair. He reports that he fell yesterday after bending over the cabinet. He denies N/V/D but reports that his ankles are swollen today. His appetite is not very good.  Overall,  he feels ready for next cycle of chemo today.    REVIEW OF SYSTEMS:  Review of Systems  Constitutional: Positive for appetite change (severely decreased) and fatigue (severe).  HENT:   Positive for trouble swallowing (trouble chewing).   Respiratory: Positive for cough (dry).   Cardiovascular:  Positive for leg swelling (ankle swelling).  Gastrointestinal: Negative for diarrhea, nausea and vomiting.  Neurological: Positive for numbness (feet).  Psychiatric/Behavioral: Positive for sleep disturbance.  All other systems reviewed and are negative.   PAST MEDICAL/SURGICAL HISTORY:  Past Medical History:  Diagnosis Date  . BPH (benign prostatic hyperplasia)   . Colonic diverticular abscess 01/08/2015  . Diverticulitis 67/61/95   complicated by abscess and required percutaneous drainage  . GERD (gastroesophageal reflux disease)   . H/O ETOH abuse   . History of chemotherapy last done jan 2017  . History of radiation therapy 01/05/13-02/10/13   45 gray to left occipital condyle region  . History of radiation therapy 12/01/16-12/10/16   Parasternal nodule, chest- 24 Gy total delivered in 8 fractions, Left sacro-iliac, pelvis- 24 Gy total delivered in 8 fractions   . History of radiation therapy 02/19/17-02/22/17   right temporal scalp 30 Gy in 10 fractions  . Intra-abdominal abscess ([place])   . Multiple myeloma ([place]) 2015  . Neuropathy   . Radiation 02/21/14-03/08/14   right posterior chest wall area 30 gray  . Radiation 04/19/14-05/02/14   lumbar spine 25 gray  . Skull lesion    Left occipital condyle  . Wrist fracture, left    x 2   Past Surgical History:  Procedure Laterality Date  . COLONOSCOPY N/A 04/19/2015   Procedure: COLONOSCOPY;  Surgeon: Danie Binder, MD;  Location: [facility];  Service: Endoscopy;  Laterality: N/A;  1:30 PM  . CYST EXCISION  1959   tail bone  . IR IMAGING GUIDED PORT INSERTION  09/25/2017    SOCIAL HISTORY:  Social History   Socioeconomic History  . Marital status: Married    Spouse name: Not on file  . Number of children: 3  . Years of education: Not on file  . Highest education level: Not on file  Occupational History    Employer: RETIRED  Tobacco Use  . Smoking status: Never Smoker  . Smokeless tobacco: Never Used  Vaping Use  .  Vaping Use: Never used  Substance and Sexual Activity  . Alcohol use: No    Comment: " Not much no more"  . Drug use: No  . Sexual activity: Not on file  Other Topics Concern  . Not on file  Social History Narrative  . Not on file   Social Determinants of Health   Financial Resource Strain:   . Difficulty of Paying Living Expenses:   Food Insecurity:   . Worried About Charity fundraiser in the Last Year:   . Arboriculturist in the Last Year:   Transportation Needs:   . Film/video editor (Medical):   Marland Kitchen Lack of Transportation (Non-Medical):   Physical Activity:   . Days of Exercise per Week:   . Minutes of Exercise per Session:   Stress:   . Feeling of Stress :   Social Connections:   . Frequency of Communication with Friends and Family:   . Frequency of Social Gatherings with Friends and Family:   . Attends Religious Services:   . Active Member of Clubs or Organizations:   . Attends Archivist Meetings:   Marland Kitchen Marital  Status:   Intimate Partner Violence:   . Fear of Current or Ex-Partner:   . Emotionally Abused:   Marland Kitchen Physically Abused:   . Sexually Abused:     FAMILY HISTORY:  Family History  Problem Relation Age of Onset  . Diabetes Mother   . Stroke Mother   . Hypertension Father     CURRENT MEDICATIONS:  Current Outpatient Medications  Medication Sig Dispense Refill  . acyclovir (ZOVIRAX) 400 MG tablet TAKE 1 TABLET BY MOUTH 2 TIMES DAILY 60 tablet 3  . acyclovir (ZOVIRAX) 400 MG tablet Take 1 tablet (400 mg total) by mouth 2 (two) times daily. 60 tablet 3  . ALPRAZolam (XANAX) 1 MG tablet TAKE 1 TABLET BY MOUTH 3 TIMES DAILY AS NEEDED FOR ANXIETY 60 tablet 3  . CALCIUM-VITAMIN D PO Take 1 tablet by mouth 2 (two) times daily.     Marland Kitchen dexamethasone (DECADRON) 4 MG tablet Take 2.5 tablets (10 mg total) by mouth once a week. Take 10 mg once weekly 10 tablet 12  . diclofenac Sodium (VOLTAREN) 1 % GEL Apply topically as needed. Pt has not picked up yet     . fluticasone (FLONASE) 50 MCG/ACT nasal spray Place 2 sprays into both nostrils daily. 16 g 6  . furosemide (LASIX) 20 MG tablet TAKE 1 TABLET BY MOUTH DAILY AS NEEDED FOR EDEMA. 30 tablet 0  . gabapentin (NEURONTIN) 400 MG capsule TAKE 1 CAPSULE BY MOUTH 3 TIMES DAILY 90 capsule 4  . Homeopathic Products ([place]) Apply topically as needed.    . lidocaine-prilocaine (EMLA) cream Apply small amount over port one (1) hour prior to appointment. 30 g 0  . MAGNESIUM PO Take 1 tablet by mouth at bedtime.    . montelukast (SINGULAIR) 10 MG tablet Take 1 tablet (10 mg total) by mouth at bedtime. 30 tablet 3  . omeprazole (PRILOSEC) 20 MG capsule TAKE 1 CAPSULE BY MOUTH ONCE DAILY 30 capsule 5  . Oxycodone HCl 10 MG TABS Take 1 tablet (10 mg total) by mouth every 8 (eight) hours as needed. for pain 120 tablet 0  . senna-docusate (SENOKOT-S) 8.6-50 MG tablet Take 1 tablet by mouth 2 (two) times daily. 60 tablet 5  . sulfamethoxazole-trimethoprim (BACTRIM DS) 800-160 MG tablet Take 1 tablet by mouth 2 (two) times daily. 42 tablet 0  . Suvorexant (BELSOMRA) 10 MG TABS Take 10 mg by mouth at bedtime as needed. 30 tablet 3  . tamsulosin (FLOMAX) 0.4 MG CAPS capsule TAKE ONE CAPSULE BY MOUTH EVERY NIGHT ATBEDTIME 30 capsule 11  . tiZANidine (ZANAFLEX) 4 MG tablet Take 1 tablet (4 mg total) by mouth every 6 (six) hours as needed (headache). 30 tablet 0  . traZODone (DESYREL) 100 MG tablet TAKE 1 TABLET ( 100MG TOTAL ) BY MOUTH AT BEDTIME . MAY  TITRATE UP TO 300MG NIGHTLY 60 tablet 3  . zolendronic acid (ZOMETA) 4 MG/5ML injection Inject 4 mg into the vein every 30 (thirty) days.      No current facility-administered medications for this visit.    ALLERGIES:  Allergies  Allergen Reactions  . Codeine Other (See Comments)    Headache  . Morphine And Related Other (See Comments)    Makes him feel weird  . Revlimid [Lenalidomide] Other (See Comments)    "Causes me to become weak"     PHYSICAL EXAM:  Performance status (ECOG): 1 - Symptomatic but completely ambulatory  Vitals:   09/15/19 0931  BP: (!) 108/56  Pulse: (!) 58  Resp: 18  Temp: (!) 97.2 F (36.2 C)  SpO2: 97%   Wt Readings from Last 3 Encounters:  09/15/19 194 lb 9.6 oz (88.3 kg)  08/18/19 197 lb 3.2 oz (89.4 kg)  07/21/19 200 lb 6.4 oz (90.9 kg)   Physical Exam Chest:     Comments: Port-a-Cath on R chest Musculoskeletal:     Right lower leg: Edema (1+) present.     Left lower leg: Edema (1+) present.     LABORATORY DATA:  I have reviewed the labs as listed.  CBC Latest Ref Rng & Units 09/15/2019 08/18/2019 07/21/2019  WBC 4.0 - 10.5 K/uL 4.0 5.2 4.0  Hemoglobin 13.0 - 17.0 g/dL 13.2 13.5 13.2  Hematocrit 39 - 52 % 40.9 41.9 39.6  Platelets 150 - 400 K/uL 107(L) 111(L) 110(L)   CMP Latest Ref Rng & Units 09/15/2019 08/18/2019 07/21/2019  Glucose 70 - 99 mg/dL 94 85 92  BUN 8 - 23 mg/dL 20 21 26(H)  Creatinine 0.61 - 1.24 mg/dL 0.66 0.66 0.75  Sodium 135 - 145 mmol/L 142 140 138  Potassium 3.5 - 5.1 mmol/L 4.0 4.1 3.6  Chloride 98 - 111 mmol/L 105 106 102  CO2 22 - 32 mmol/L _0 Calcium 8.9 - 10.3 mg/dL 8.9 8.4(L) 8.7(L)  Total Protein 6.5 - 8.1 g/dL 5.4(L) 5.4(L) 5.4(L)  Total Bilirubin 0.3 - 1.2 mg/dL 0.6 0.5 0.7  Alkaline Phos 38 - 126 U/L 18(L) 20(L) 21(L)  AST 15 - 41 U/L 14(L) 14(L) 13(L)  ALT 0 - 44 U/L _1 Lab Results  Component Value Date   LDH 117 08/18/2019   LDH 119 07/21/2019   LDH 133 06/23/2019   Lab Results  Component Value Date   TOTALPROTELP 5.2 (L) 08/18/2019   ALBUMINELP 3.1 08/18/2019   A1GS 0.2 08/18/2019   A2GS 0.6 08/18/2019   BETS 0.7 08/18/2019   BETA2SER 5.3 04/07/2014   GAMS 0.5 08/18/2019   MSPIKE 0.2 (H) 08/18/2019   SPEI Comment 08/18/2019    Lab Results  Component Value Date   KPAFRELGTCHN 18.6 08/18/2019   LAMBDASER 4.0 (L) 08/18/2019   KAPLAMBRATIO 4.65 (H) 08/18/2019    DIAGNOSTIC IMAGING:  I have independently  reviewed the scans and discussed with the patient. No results found.   ASSESSMENT:  1.Recurrent IgG kappa multiple myeloma:  -On monthly daratumumab and prednisone. -MRI of the thoracic spine showed no myeloma lesions. Right first rib unchanged. Left rib lesion has improved. Left sacral ala vertical fracture present. Spinal stenosis at L3-L4. Compression fracture of L2 and L4.   PLAN:  1. IgG kappa myeloma: -I have reviewed myeloma labs from 08/18/2019.  M spike has slightly increased to 0.2 g from 0.1 g previously. -Kappa light chains are 18.6, lambda light chain 4, ratio 4.65. -Labs from today shows platelet count is 107 with normal white count and hemoglobin. -LFTs are normal.  He will proceed with daratumumab. -He reportedly fell when he got lightheaded while bending forward.  He has fallen previously when he tried to bend forward.  I have told him to avoid that movement. -He will be reevaluated in 4 weeks.  2. Low back pain: -Continue oxycodone as needed.  He takes anywhere between 0-3 pills/day.  3. Peripheral neuropathy: -Continue gabapentin 3 times daily.  4. Bone protection: -We will continue Zometa every 8 weeks.  Calcium today was normal at 8.9.  5. Sleeping difficulty: -Continue Xanax along with gabapentin at  bedtime which is helping.  6. Hypokalemia: -Potassium is 4.0.  Continue potassium supplements.   Orders placed this encounter:  No orders of the defined types were placed in this encounter.    Derek Jack, MD [place] [phone]   I, Milinda Antis, am acting as a scribe for Dr. Sanda Linger.  I, Derek Jack MD, have reviewed the above documentation for accuracy and completeness, and I agree with the above.

## 2019-09-17 MED ORDER — LIDOCAINE-PRILOCAINE 2.5-2.5 % EX CREA: TOPICAL_CREAM | CUTANEOUS | 0 refills | Status: DC

## 2019-09-28 DIAGNOSIS — Z20828 Contact with and (suspected) exposure to other viral communicable diseases: Secondary | ICD-10-CM | POA: Diagnosis not present

## 2019-10-05 ENCOUNTER — Other Ambulatory Visit: Payer: Self-pay | Admitting: Family Medicine

## 2019-10-13 ENCOUNTER — Other Ambulatory Visit: Payer: Self-pay

## 2019-10-13 ENCOUNTER — Inpatient Hospital Stay (HOSPITAL_COMMUNITY): Payer: Medicare Other | Attending: Hematology

## 2019-10-13 ENCOUNTER — Inpatient Hospital Stay (HOSPITAL_BASED_OUTPATIENT_CLINIC_OR_DEPARTMENT_OTHER): Payer: Medicare Other | Admitting: Hematology

## 2019-10-13 ENCOUNTER — Encounter (HOSPITAL_COMMUNITY): Payer: Self-pay | Admitting: Hematology

## 2019-10-13 ENCOUNTER — Inpatient Hospital Stay (HOSPITAL_COMMUNITY): Payer: Medicare Other

## 2019-10-13 VITALS — BP 120/55 | HR 70 | Temp 97.2°F | Resp 18 | Wt 192.0 lb

## 2019-10-13 VITALS — BP 94/46 | HR 52 | Temp 96.4°F | Resp 18

## 2019-10-13 DIAGNOSIS — Z5112 Encounter for antineoplastic immunotherapy: Secondary | ICD-10-CM | POA: Insufficient documentation

## 2019-10-13 DIAGNOSIS — C9000 Multiple myeloma not having achieved remission: Secondary | ICD-10-CM | POA: Diagnosis not present

## 2019-10-13 DIAGNOSIS — R300 Dysuria: Secondary | ICD-10-CM

## 2019-10-13 LAB — CBC WITH DIFFERENTIAL/PLATELET
Abs Immature Granulocytes: 0.02 10*3/uL (ref 0.00–0.07)
Basophils Absolute: 0 10*3/uL (ref 0.0–0.1)
Basophils Relative: 0 %
Eosinophils Absolute: 0 10*3/uL (ref 0.0–0.5)
Eosinophils Relative: 1 %
HCT: 40.2 % (ref 39.0–52.0)
Hemoglobin: 12.8 g/dL — ABNORMAL LOW (ref 13.0–17.0)
Immature Granulocytes: 0 %
Lymphocytes Relative: 29 %
Lymphs Abs: 1.5 10*3/uL (ref 0.7–4.0)
MCH: 30.1 pg (ref 26.0–34.0)
MCHC: 31.8 g/dL (ref 30.0–36.0)
MCV: 94.6 fL (ref 80.0–100.0)
Monocytes Absolute: 0.5 10*3/uL (ref 0.1–1.0)
Monocytes Relative: 10 %
Neutro Abs: 3.1 10*3/uL (ref 1.7–7.7)
Neutrophils Relative %: 60 %
Platelets: 159 10*3/uL (ref 150–400)
RBC: 4.25 MIL/uL (ref 4.22–5.81)
RDW: 14.4 % (ref 11.5–15.5)
WBC: 5.1 10*3/uL (ref 4.0–10.5)
nRBC: 0 % (ref 0.0–0.2)

## 2019-10-13 LAB — COMPREHENSIVE METABOLIC PANEL
ALT: 10 U/L (ref 0–44)
AST: 12 U/L — ABNORMAL LOW (ref 15–41)
Albumin: 3.3 g/dL — ABNORMAL LOW (ref 3.5–5.0)
Alkaline Phosphatase: 20 U/L — ABNORMAL LOW (ref 38–126)
Anion gap: 8 (ref 5–15)
BUN: 24 mg/dL — ABNORMAL HIGH (ref 8–23)
CO2: 27 mmol/L (ref 22–32)
Calcium: 8.8 mg/dL — ABNORMAL LOW (ref 8.9–10.3)
Chloride: 105 mmol/L (ref 98–111)
Creatinine, Ser: 0.61 mg/dL (ref 0.61–1.24)
GFR calc Af Amer: >60 mL/min (ref >60)
GFR calc non Af Amer: >60 mL/min (ref >60)
Glucose, Bld: 90 mg/dL (ref 70–99)
Potassium: 4.3 mmol/L (ref 3.5–5.1)
Sodium: 140 mmol/L (ref 135–145)
Total Bilirubin: 0.5 mg/dL (ref 0.3–1.2)
Total Protein: 5.2 g/dL — ABNORMAL LOW (ref 6.5–8.1)

## 2019-10-13 LAB — URINALYSIS, ROUTINE W REFLEX MICROSCOPIC
Bilirubin Urine: NEGATIVE
Glucose, UA: NEGATIVE mg/dL
Hgb urine dipstick: NEGATIVE
Ketones, ur: NEGATIVE mg/dL
Leukocytes,Ua: NEGATIVE
Nitrite: NEGATIVE
Protein, ur: NEGATIVE mg/dL
Specific Gravity, Urine: 1.016 (ref 1.005–1.030)
pH: 6 (ref 5.0–8.0)

## 2019-10-13 LAB — MAGNESIUM: Magnesium: 2.2 mg/dL (ref 1.7–2.4)

## 2019-10-13 LAB — LACTATE DEHYDROGENASE: LDH: 124 U/L (ref 98–192)

## 2019-10-13 MED ORDER — DARATUMUMAB-HYALURONIDASE-FIHJ 1800-30000 MG-UT/15ML ~~LOC~~ SOLN
1800.0000 mg | Freq: Once | SUBCUTANEOUS | Status: AC
  Administered 2019-10-13: 1800 mg via SUBCUTANEOUS
  Filled 2019-10-13: qty 15

## 2019-10-13 MED ORDER — PROCHLORPERAZINE MALEATE 10 MG PO TABS
10.0000 mg | ORAL_TABLET | Freq: Once | ORAL | Status: AC
  Administered 2019-10-13: 10 mg via ORAL

## 2019-10-13 MED ORDER — SODIUM CHLORIDE 0.9% FLUSH
10.0000 mL | Freq: Once | INTRAVENOUS | Status: AC
  Administered 2019-10-13: 10 mL via INTRAVENOUS

## 2019-10-13 MED ORDER — HEPARIN SOD (PORK) LOCK FLUSH 100 UNIT/ML IV SOLN
500.0000 [IU] | Freq: Once | INTRAVENOUS | Status: AC
  Administered 2019-10-13: 500 [IU] via INTRAVENOUS

## 2019-10-13 MED ORDER — DIPHENHYDRAMINE HCL 25 MG PO CAPS
50.0000 mg | ORAL_CAPSULE | Freq: Once | ORAL | Status: AC
  Administered 2019-10-13: 50 mg via ORAL

## 2019-10-13 MED ORDER — DIPHENHYDRAMINE HCL 25 MG PO CAPS
ORAL_CAPSULE | ORAL | Status: AC
  Filled 2019-10-13: qty 2

## 2019-10-13 MED ORDER — ACETAMINOPHEN 325 MG PO TABS
650.0000 mg | ORAL_TABLET | Freq: Once | ORAL | Status: AC
  Administered 2019-10-13: 650 mg via ORAL

## 2019-10-13 MED ORDER — DEXAMETHASONE 4 MG PO TABS
ORAL_TABLET | ORAL | Status: AC
  Filled 2019-10-13: qty 5

## 2019-10-13 MED ORDER — DEXAMETHASONE 4 MG PO TABS
20.0000 mg | ORAL_TABLET | Freq: Once | ORAL | Status: AC
  Administered 2019-10-13: 20 mg via ORAL

## 2019-10-13 MED ORDER — ACETAMINOPHEN 325 MG PO TABS
ORAL_TABLET | ORAL | Status: AC
  Filled 2019-10-13: qty 2

## 2019-10-13 MED ORDER — PROCHLORPERAZINE MALEATE 10 MG PO TABS
ORAL_TABLET | ORAL | Status: AC
  Filled 2019-10-13: qty 1

## 2019-10-13 NOTE — Progress Notes (Signed)
[place], [place] [address]   CLINIC:  Medical Oncology/Hematology  PCP:  [name], MD [address] [place] [address] [phone]   REASON FOR VISIT:  Follow-up for IgG kappa multiple myeloma  PRIOR THERAPY: Bortezomib x 6 cycles from 04/06/2017 to 07/28/2014; x 7 cycles from 02/18/2017 to 08/26/2017.  NGS Results: Not done  CURRENT THERAPY: Darzalex Faspro every 4 weeks  BRIEF ONCOLOGIC HISTORY:  Oncology History  Multiple myeloma without remission ([place])  11/16/2012 Initial Diagnosis   L occipital condyle destructive lesion, not biopsied secondary to location. XRT   12/01/2012 Imaging   L3 superior endplate compression FX, no lytic lesions to suggest myeloma   02/06/2014 Imaging   soft tissue mass along the right posterior fifth rib with some rib destruction   02/16/2014 Miscellaneous   Normal CBC, Normal CMP, kappa,lamda ratio of 2.62 (abnormal), UIEP with slightly restricted band, IgG kappa, SPEP/IEP with monoclonal protein at 0.79 g/dl, normal igG, suppressed IGM   02/20/2014 Bone Marrow Biopsy   33% kappa restricted plasma cells Normal FISH, normal cytogenetics   02/21/2014 - 03/08/2014 Radiation Therapy   30Gy to R rib lesion, lesion not biopsied   03/16/2014 PET scan   Scattered hypermetabolic osseous lesions, including the calvarium, ribs, left scapula, sacrum, and left femoral shaft. An expansile left vertebral body lesion at L3 extends into the epidural space of the left lateral recess,    03/17/2014 - 04/07/2014 Chemotherapy   Velcade and Dexamethasone due to initial denial of Revlimid by insurance company.  Zometa monthly.   03/22/2014 Imaging   MR_ L-Spine- Enhancing lesions compatible with multiple myeloma at L2, L3, and S1-2.   04/07/2014 - 07/28/2014 Chemotherapy   RVD with Revlimid at 25 mg days 1-14.  Revlimid dose reduced to 25 mg every other day x 14 days with 7 day respite beginning on day 1 of  cycle 2.   04/17/2014 Adverse Reaction   Revlimid-induced rash.  Treated with steroids.   07/28/2014 - 08/04/2014 Chemotherapy   Revlimid daily   08/04/2014 Adverse Reaction   Velcade-induced peripheral neuropathy   08/04/2014 Treatment Plan Change   D/C Velcade   08/11/2014 PET scan   Response to therapy. Improvement and resolution of foci of osseous hypermetabolism.   08/22/2014 - 08/28/2014 Chemotherapy   Revlimid 25 mg days 1-21 every 28 days   08/28/2014 Adverse Reaction   Revlimid-induced rash. Medication held.  Medrol dose Pak prescribed.   09/04/2014 - 01/08/2015 Chemotherapy   Revlimid 10 mg every other day, without dexamethasone   01/08/2015 - 01/15/2015 Hospital Admission   Colonic diverticular abscess with IR drain placement   03/02/2015 PET scan   Only bony uptake is low activity at site of deformity/callus at R second rib FX, high activity in sigmoid colon, advanced sigmoid divertidulosis. Abscess not resolved?   06/04/2015 Imaging   CT nonobstructive L renal calculus, diverticulosis of descending and sigmoid colon without inflammation, moderate prostatic enlargement   07/12/2015 Surgery   Diverticulitis s/p robotic sigmoid colectomy with Dr. Johney Maine   08/23/2015 PET scan   Primarily similar hypermetabolic osseous foci within the R sided ribs. new L third rib focus of hypermetab and sclerosis is favored to be related to healing FX. no soft tissue myeloma id'd. Presumed postop hypermetab and edema about the R pelvic wall    03/06/2016 PET scan   1. Reduced activity in the prior lesion such as the right second and  fifth rib lesions, activity nearly completely resolved and significantly less than added mediastinal blood pool activity. No new lesions are identified. 2. Other imaging findings of potential clinical significance: Coronary, aortic arch, and branch vessel atherosclerotic vascular disease. Aortoiliac atherosclerotic vascular disease. Enlarged prostate gland. Colonic  diverticula.   02/17/2017 PET scan   HEAD/NECK: No hypermetabolic activity in the scalp. No hypermetabolic cervical lymph nodes. CHEST: No hypermetabolic mediastinal or hilar nodes. Right upper lobe scarring/atelectasis. No suspicious pulmonary nodules on the CT scan. ABDOMEN/PELVIS: No abnormal hypermetabolic activity within the liver, pancreas, adrenal glands, or spleen. No hypermetabolic lymph nodes in the abdomen or pelvis. Atherosclerotic calcifications of the abdominal aorta and branch vessels. Left renal sinus cysts. Sigmoid diverticulosis, without evidence of diverticulitis. Prostatomegaly. SKELETON: Vague hypermetabolism involving the inferior sternum, max SUV 3.2, previously 8.2. Focal hypermetabolism involving the left sacrum, max SUV 4.0, previously 6.7. EXTREMITIES: No abnormal hypermetabolic activity in the lower extremities.   02/18/2017 -  Chemotherapy   Bortezomib 1.79m/m2 QWk + Dexamethasone 1103mQTue/Fri --Cycle #1, 02/18/17    02/18/2017 - 08/26/2017 Chemotherapy   The patient had bortezomib SQ (VELCADE) chemo injection 3 mg, 1.3 mg/m2 = 3 mg, Subcutaneous,  Once, 7 of 9 cycles Administration: 3 mg (02/18/2017), 3 mg (02/26/2017), 3 mg (03/04/2017), 3 mg (03/11/2017), 3 mg (04/08/2017), 3 mg (03/18/2017), 3 mg (03/25/2017), 3 mg (04/01/2017), 3 mg (05/06/2017), 3 mg (05/20/2017), 3 mg (06/03/2017), 3 mg (06/17/2017), 3 mg (07/01/2017), 3 mg (07/15/2017), 3 mg (07/29/2017), 3 mg (08/12/2017), 3 mg (08/26/2017)  for chemotherapy treatment.    09/17/2017 -  Chemotherapy   The patient had dexamethasone (DECADRON) tablet 20 mg, 20 mg (100 % of original dose 20 mg), Oral,  Once, 7 of 10 cycles Dose modification: 20 mg (original dose 20 mg, Cycle 17) Administration: 20 mg (01/17/2019), 20 mg (02/16/2019), 20 mg (03/16/2019), 20 mg (06/23/2019), 20 mg (07/21/2019), 20 mg (08/18/2019), 20 mg (09/15/2019) daratumumab-hyaluronidase-fihj (DARZALEX FASPRO) 1800-30000 MG-UT/15ML chemo SQ injection 1,800 mg, 1,800 mg,  Subcutaneous,  Once, 10 of 13 cycles Administration: 1,800 mg (10/25/2018), 1,800 mg (11/22/2018), 1,800 mg (12/20/2018), 1,800 mg (01/17/2019), 1,800 mg (02/16/2019), 1,800 mg (03/16/2019), 1,800 mg (06/23/2019), 1,800 mg (07/21/2019), 1,800 mg (08/18/2019), 1,800 mg (09/15/2019) daratumumab (DARZALEX) 1,800 mg in sodium chloride 0.9 % 910 mL (1.8 mg/mL) chemo infusion, 15.9 mg/kg = 1,820 mg, Intravenous, Once, 1 of 1 cycle Administration: 1,800 mg (09/17/2017) daratumumab (DARZALEX) 1,800 mg in sodium chloride 0.9 % 410 mL (3.6 mg/mL) chemo infusion, 1,820 mg, Intravenous, Once, 13 of 13 cycles Administration: 1,800 mg (10/28/2017), 1,800 mg (11/04/2017), 1,800 mg (11/11/2017), 1,800 mg (11/18/2017), 1,800 mg (11/25/2017), 1,800 mg (12/02/2017), 1,800 mg (12/09/2017), 1,800 mg (12/16/2017), 1,800 mg (12/30/2017), 1,800 mg (04/26/2018), 1,800 mg (01/13/2018), 1,800 mg (02/12/2018), 1,800 mg (02/26/2018), 1,800 mg (03/15/2018), 1,800 mg (03/29/2018), 1,800 mg (04/12/2018), 1,800 mg (05/10/2018), 1,800 mg (06/07/2018), 1,800 mg (07/05/2018), 1,800 mg (08/02/2018), 1,800 mg (08/30/2018), 1,800 mg (09/27/2018)  for chemotherapy treatment.      CANCER STAGING: Cancer Staging No matching staging information was found for the patient.  INTERVAL HISTORY:  [name] a [age].o. male, returns for routine follow-up and consideration for next cycle of chemotherapy. JaIzzakas last seen on 09/15/2019.  Due for cycle #24 of Darzalex Faspro today.   Overall, today he tells me he has been feeling pretty well. He has not taken oxycodone for his back pain for the last 4 days and has been eating and doing more daily. He mistepped 3  times but denies falling to the floor. He continues taking gabapentin for his numbness. He takes Decadron 2.5 tablets once a week. He complains of burning with urination and took Bactrim last time in April. He takes Lasix for swelling as needed. He denies having jaw pain.  Overall, he feels ready for  next cycle of chemo today.    REVIEW OF SYSTEMS:  Review of Systems  Constitutional: Positive for appetite change (mildly decreased), diaphoresis (night sweats) and fatigue (severe).  Eyes: Positive for eye problems (hazy occasionally).  Gastrointestinal: Positive for constipation.  Genitourinary: Positive for dysuria (burning).   Neurological: Positive for headaches and numbness.  All other systems reviewed and are negative.   PAST MEDICAL/SURGICAL HISTORY:  Past Medical History:  Diagnosis Date  . BPH (benign prostatic hyperplasia)   . Colonic diverticular abscess 01/08/2015  . Diverticulitis 15/94/58   complicated by abscess and required percutaneous drainage  . GERD (gastroesophageal reflux disease)   . H/O ETOH abuse   . History of chemotherapy last done jan 2017  . History of radiation therapy 01/05/13-02/10/13   45 gray to left occipital condyle region  . History of radiation therapy 12/01/16-12/10/16   Parasternal nodule, chest- 24 Gy total delivered in 8 fractions, Left sacro-iliac, pelvis- 24 Gy total delivered in 8 fractions   . History of radiation therapy 02/19/17-02/22/17   right temporal scalp 30 Gy in 10 fractions  . Intra-abdominal abscess ([place])   . Multiple myeloma ([place]) 2015  . Neuropathy   . Radiation 02/21/14-03/08/14   right posterior chest wall area 30 gray  . Radiation 04/19/14-05/02/14   lumbar spine 25 gray  . Skull lesion    Left occipital condyle  . Wrist fracture, left    x 2   Past Surgical History:  Procedure Laterality Date  . COLONOSCOPY N/A 04/19/2015   Procedure: COLONOSCOPY;  Surgeon: Danie Binder, MD;  Location: [facility];  Service: Endoscopy;  Laterality: N/A;  1:30 PM  . CYST EXCISION  1959   tail bone  . IR IMAGING GUIDED PORT INSERTION  09/25/2017    SOCIAL HISTORY:  Social History   Socioeconomic History  . Marital status: Married    Spouse name: Not on file  . Number of children: 3  . Years of education: Not on file  .  Highest education level: Not on file  Occupational History    Employer: RETIRED  Tobacco Use  . Smoking status: Never Smoker  . Smokeless tobacco: Never Used  Vaping Use  . Vaping Use: Never used  Substance and Sexual Activity  . Alcohol use: No    Comment: " Not much no more"  . Drug use: No  . Sexual activity: Not on file  Other Topics Concern  . Not on file  Social History Narrative  . Not on file   Social Determinants of Health   Financial Resource Strain:   . Difficulty of Paying Living Expenses: Not on file  Food Insecurity:   . Worried About Charity fundraiser in the Last Year: Not on file  . Ran Out of Food in the Last Year: Not on file  Transportation Needs:   . Lack of Transportation (Medical): Not on file  . Lack of Transportation (Non-Medical): Not on file  Physical Activity:   . Days of Exercise per Week: Not on file  . Minutes of Exercise per Session: Not on file  Stress:   . Feeling of Stress : Not on file  Social  Connections:   . Frequency of Communication with Friends and Family: Not on file  . Frequency of Social Gatherings with Friends and Family: Not on file  . Attends Religious Services: Not on file  . Active Member of Clubs or Organizations: Not on file  . Attends Archivist Meetings: Not on file  . Marital Status: Not on file  Intimate Partner Violence:   . Fear of Current or Ex-Partner: Not on file  . Emotionally Abused: Not on file  . Physically Abused: Not on file  . Sexually Abused: Not on file    FAMILY HISTORY:  Family History  Problem Relation Age of Onset  . Diabetes Mother   . Stroke Mother   . Hypertension Father     CURRENT MEDICATIONS:  Current Outpatient Medications  Medication Sig Dispense Refill  . acyclovir (ZOVIRAX) 400 MG tablet Take 1 tablet (400 mg total) by mouth 2 (two) times daily. 60 tablet 3  . ALPRAZolam (XANAX) 1 MG tablet TAKE 1 TABLET BY MOUTH 3 TIMES DAILY AS NEEDED FOR ANXIETY 60 tablet 3  .  CALCIUM-VITAMIN D PO Take 1 tablet by mouth 2 (two) times daily.     Marland Kitchen dexamethasone (DECADRON) 4 MG tablet Take 2.5 tablets (10 mg total) by mouth once a week. Take 10 mg once weekly 10 tablet 12  . diclofenac Sodium (VOLTAREN) 1 % GEL Apply topically as needed. Pt has not picked up yet    . fluticasone (FLONASE) 50 MCG/ACT nasal spray Place 2 sprays into both nostrils daily. 16 g 6  . furosemide (LASIX) 20 MG tablet TAKE 1 TABLET BY MOUTH DAILY AS NEEDED FOR EDEMA. 30 tablet 0  . gabapentin (NEURONTIN) 400 MG capsule TAKE 1 CAPSULE BY MOUTH 3 TIMES DAILY 90 capsule 4  . Homeopathic Products ([place]) Apply topically as needed.    . lidocaine-prilocaine (EMLA) cream Apply small amount over port one (1) hour prior to appointment. 30 g 0  . montelukast (SINGULAIR) 10 MG tablet Take 1 tablet (10 mg total) by mouth at bedtime. 30 tablet 3  . omeprazole (PRILOSEC) 20 MG capsule TAKE 1 CAPSULE BY MOUTH ONCE DAILY 30 capsule 5  . Oxycodone HCl 10 MG TABS Take 1 tablet (10 mg total) by mouth every 8 (eight) hours as needed. for pain 120 tablet 0  . senna-docusate (SENOKOT-S) 8.6-50 MG tablet Take 1 tablet by mouth 2 (two) times daily. 60 tablet 5  . tamsulosin (FLOMAX) 0.4 MG CAPS capsule TAKE ONE CAPSULE BY MOUTH EVERY NIGHT ATBEDTIME 30 capsule 11  . tiZANidine (ZANAFLEX) 4 MG tablet Take 1 tablet (4 mg total) by mouth every 6 (six) hours as needed (headache). 30 tablet 0  . traZODone (DESYREL) 100 MG tablet TAKE 1 TABLET ( 100MG TOTAL ) BY MOUTH AT BEDTIME . MAY  TITRATE UP TO 300MG NIGHTLY 60 tablet 3  . zolendronic acid (ZOMETA) 4 MG/5ML injection Inject 4 mg into the vein every 30 (thirty) days.     Marland Kitchen MAGNESIUM PO Take 1 tablet by mouth at bedtime.    . sulfamethoxazole-trimethoprim (BACTRIM DS) 800-160 MG tablet Take 1 tablet by mouth 2 (two) times daily. (Patient not taking: Reported on 10/13/2019) 42 tablet 0  . Suvorexant (BELSOMRA) 10 MG TABS Take 10 mg by mouth at bedtime as needed.  30 tablet 3   No current facility-administered medications for this visit.    ALLERGIES:  Allergies  Allergen Reactions  . Codeine Other (See Comments)  Headache  . Morphine And Related Other (See Comments)    Makes him feel weird  . Revlimid [Lenalidomide] Other (See Comments)    "Causes me to become weak"    PHYSICAL EXAM:  Performance status (ECOG): 1 - Symptomatic but completely ambulatory  Vitals:   10/13/19 0838  BP: (!) 120/55  Pulse: 70  Resp: 18  Temp: (!) 97.2 F (36.2 C)  SpO2: 98%   Wt Readings from Last 3 Encounters:  10/13/19 192 lb (87.1 kg)  09/15/19 194 lb 9.6 oz (88.3 kg)  08/18/19 197 lb 3.2 oz (89.4 kg)   Physical Exam Vitals reviewed.  Constitutional:      Appearance: Normal appearance.  Cardiovascular:     Rate and Rhythm: Normal rate and regular rhythm.     Pulses: Normal pulses.     Heart sounds: Normal heart sounds.  Pulmonary:     Effort: Pulmonary effort is normal.     Breath sounds: Normal breath sounds.  Chest:     Comments: Port-a-Cath in R chest Musculoskeletal:     Right lower leg: No edema.     Left lower leg: No edema.  Neurological:     General: No focal deficit present.     Mental Status: He is alert and oriented to person, place, and time.  Psychiatric:        Mood and Affect: Mood normal.        Behavior: Behavior normal.     LABORATORY DATA:  I have reviewed the labs as listed.  CBC Latest Ref Rng & Units 10/13/2019 09/15/2019 08/18/2019  WBC 4.0 - 10.5 K/uL 5.1 4.0 5.2  Hemoglobin 13.0 - 17.0 g/dL 12.8(L) 13.2 13.5  Hematocrit 39 - 52 % 40.2 40.9 41.9  Platelets 150 - 400 K/uL 159 107(L) 111(L)   CMP Latest Ref Rng & Units 10/13/2019 09/15/2019 08/18/2019  Glucose 70 - 99 mg/dL 90 94 85  BUN 8 - 23 mg/dL 24(H) 20 21  Creatinine 0.61 - 1.24 mg/dL 0.61 0.66 0.66  Sodium 135 - 145 mmol/L 140 142 140  Potassium 3.5 - 5.1 mmol/L 4.3 4.0 4.1  Chloride 98 - 111 mmol/L 105 105 106  CO2 22 - 32 mmol/L _0 Calcium  8.9 - 10.3 mg/dL 8.8(L) 8.9 8.4(L)  Total Protein 6.5 - 8.1 g/dL 5.2(L) 5.4(L) 5.4(L)  Total Bilirubin 0.3 - 1.2 mg/dL 0.5 0.6 0.5  Alkaline Phos 38 - 126 U/L 20(L) 18(L) 20(L)  AST 15 - 41 U/L 12(L) 14(L) 14(L)  ALT 0 - 44 U/L _1 Lab Results  Component Value Date   LDH 124 10/13/2019   LDH 117 08/18/2019   LDH 119 07/21/2019   Lab Results  Component Value Date   TOTALPROTELP 5.2 (L) 08/18/2019   ALBUMINELP 3.1 08/18/2019   A1GS 0.2 08/18/2019   A2GS 0.6 08/18/2019   BETS 0.7 08/18/2019   BETA2SER 5.3 04/07/2014   GAMS 0.5 08/18/2019   MSPIKE 0.2 (H) 08/18/2019   SPEI Comment 08/18/2019    Lab Results  Component Value Date   KPAFRELGTCHN 18.6 08/18/2019   LAMBDASER 4.0 (L) 08/18/2019   KAPLAMBRATIO 4.65 (H) 08/18/2019    DIAGNOSTIC IMAGING:  I have independently reviewed the scans and discussed with the patient. No results found.   ASSESSMENT:  1.Recurrent IgG kappa multiple myeloma: -On monthly daratumumab and prednisone. -MRI of the thoracic spine showed no myeloma lesions. Right first rib unchanged. Left rib lesion has improved. Left sacral ala  vertical fracture present. Spinal stenosis at L3-L4. Compression fracture of L2 and L4.   PLAN:  1. IgG kappa myeloma: -Myeloma labs from 08/18/2019 was reviewed. -LFTs today shows decreased albumin of 3.3.  CBC shows white count 5.1 and normal platelet count. -He reports that he has been feeling very well this week.  He is taking dexamethasone 10 mg weekly. -We will proceed with daratumumab today.  He will return in 4 weeks.  We will follow up on myeloma panel from today at that time. -We checked a UA as he is complaining of burning.  It was completely negative.  He was advised to drink a lot of fluids.  2. Low back pain: -Continue oxycodone as needed.  He takes anywhere between 0 to 3 pills/day.  He has not required any in the last 4 days.  3. Peripheral neuropathy: -Continue gabapentin 3 times  daily.  4. Bone protection: -We will continue Zometa every 8 weeks.  Calcium is 8.8.  5. Sleeping difficulty: -Continue Xanax along with gabapentin at bedtime which is helping.  6. Hypokalemia: -Continue potassium supplements.  Potassium today is 4.3.   Orders placed this encounter:  No orders of the defined types were placed in this encounter.    Derek Jack, MD [facility] [phone]   I, Milinda Antis, am acting as a scribe for Dr. Sanda Linger.  I, Derek Jack MD, have reviewed the above documentation for accuracy and completeness, and I agree with the above.

## 2019-10-13 NOTE — Progress Notes (Signed)
Labs reviewed with Dr Raliegh Ip, okay to treat. Will obtain UA today because he is burning when he urinates.   Tolerated treatment well today with out incidence.  Discharged via wheelchair.

## 2019-10-13 NOTE — Patient Instructions (Signed)
[facility] at [facility] Discharge Instructions  You were seen today by Dr. Katragadda. He went over your recent results. You received your treatment today. Dr. Katragadda will see you back in 4 weeks for labs and follow up.   Thank you for choosing  [facility] Center at [facility] to provide your oncology and hematology care.  To afford each patient quality time with our provider, please arrive at least 15 minutes before your scheduled appointment time.   If you have a lab appointment with the Cancer Center please come in thru the Main Entrance and check in at the main information desk  You need to re-schedule your appointment should you arrive 10 or more minutes late.  We strive to give you quality time with our providers, and arriving late affects you and other patients whose appointments are after yours.  Also, if you no show three or more times for appointments you may be dismissed from the clinic at the providers discretion.     Again, thank you for choosing [facility].  Our hope is that these requests will decrease the amount of time that you wait before being seen by our physicians.       _____________________________________________________________  Should you have questions after your visit to [facility], please contact our office at [phone] between the hours of 8:00 a.m. and 4:30 p.m.  Voicemails left after 4:00 p.m. will not be returned until the following business day.  For prescription refill requests, have your pharmacy contact our office and allow 72 hours.    Cancer Center Support Programs:   > Cancer Support Group  2nd Tuesday of the month 1pm-2pm, Journey Room    

## 2019-10-13 NOTE — Progress Notes (Signed)
Patient was assessed by Dr. Delton Coombes and labs have been reviewed.  Patient reporting some burning with urination, we will obtain urinalysis today.  Patient is okay to proceed with treatment today. Primary RN and pharmacy aware.

## 2019-10-13 NOTE — Patient Instructions (Signed)
[facility] Discharge Instructions for Patients Receiving Chemotherapy  Today you received the following chemotherapy agents   To help prevent nausea and vomiting after your treatment, we encourage you to take your nausea medication   If you develop nausea and vomiting that is not controlled by your nausea medication, call the clinic.   BELOW ARE SYMPTOMS THAT SHOULD BE REPORTED IMMEDIATELY:  *FEVER GREATER THAN 100.5 F  *CHILLS WITH OR WITHOUT FEVER  NAUSEA AND VOMITING THAT IS NOT CONTROLLED WITH YOUR NAUSEA MEDICATION  *UNUSUAL SHORTNESS OF BREATH  *UNUSUAL BRUISING OR BLEEDING  TENDERNESS IN MOUTH AND THROAT WITH OR WITHOUT PRESENCE OF ULCERS  *URINARY PROBLEMS  *BOWEL PROBLEMS  UNUSUAL RASH Items with * indicate a potential emergency and should be followed up as soon as possible.  Feel free to call the clinic should you have any questions or concerns. The clinic phone number is [phone].  Please show the CHEMO ALERT CARD at check-in to the Emergency Department and triage nurse.   

## 2019-10-13 NOTE — Patient Instructions (Signed)
[facility] at [facility] Discharge Instructions  Labs drawn from portacath today   Thank you for choosing [facility] at [facility] to provide your oncology and hematology care.  To afford each patient quality time with our provider, please arrive at least 15 minutes before your scheduled appointment time.   If you have a lab appointment with the Cancer Center please come in thru the Main Entrance and check in at the main information desk.  You need to re-schedule your appointment should you arrive 10 or more minutes late.  We strive to give you quality time with our providers, and arriving late affects you and other patients whose appointments are after yours.  Also, if you no show three or more times for appointments you may be dismissed from the clinic at the providers discretion.     Again, thank you for choosing [facility].  Our hope is that these requests will decrease the amount of time that you wait before being seen by our physicians.       _____________________________________________________________  Should you have questions after your visit to  Cancer Center, please contact our office at [phone] and follow the prompts.  Our office hours are 8:00 a.m. and 4:30 p.m. Monday - Friday.  Please note that voicemails left after 4:00 p.m. may not be returned until the following business day.  We are closed weekends and major holidays.  You do have access to a nurse 24-7, just call the main number to the clinic [phone] and do not press any options, hold on the line and a nurse will answer the phone.    For prescription refill requests, have your pharmacy contact our office and allow 72 hours.    Due to Covid, you will need to wear a mask upon entering the hospital. If you do not have a mask, a mask will be given to you at the Main Entrance upon arrival. For doctor visits, patients may have 1 support person age 18  or older with them. For treatment visits, patients can not have anyone with them due to social distancing guidelines and our immunocompromised population.     

## 2019-10-14 LAB — PROTEIN ELECTROPHORESIS, SERUM
A/G Ratio: 1.5 (ref 0.7–1.7)
Albumin ELP: 3.1 g/dL (ref 2.9–4.4)
Alpha-1-Globulin: 0.2 g/dL (ref 0.0–0.4)
Alpha-2-Globulin: 0.7 g/dL (ref 0.4–1.0)
Beta Globulin: 0.7 g/dL (ref 0.7–1.3)
Gamma Globulin: 0.5 g/dL (ref 0.4–1.8)
Globulin, Total: 2.1 g/dL — ABNORMAL LOW (ref 2.2–3.9)
M-Spike, %: 0.3 g/dL — ABNORMAL HIGH
Total Protein ELP: 5.2 g/dL — ABNORMAL LOW (ref 6.0–8.5)

## 2019-10-14 LAB — KAPPA/LAMBDA LIGHT CHAINS
Kappa free light chain: 22.6 mg/L — ABNORMAL HIGH (ref 3.3–19.4)
Kappa, lambda light chain ratio: 4.81 — ABNORMAL HIGH (ref 0.26–1.65)
Lambda free light chains: 4.7 mg/L — ABNORMAL LOW (ref 5.7–26.3)

## 2019-11-10 ENCOUNTER — Other Ambulatory Visit (HOSPITAL_COMMUNITY): Payer: Medicare Other

## 2019-11-10 ENCOUNTER — Ambulatory Visit (HOSPITAL_COMMUNITY): Payer: Medicare Other | Admitting: Hematology

## 2019-11-10 ENCOUNTER — Ambulatory Visit (HOSPITAL_COMMUNITY): Payer: Medicare Other

## 2019-11-14 ENCOUNTER — Other Ambulatory Visit: Payer: Self-pay | Admitting: Family Medicine

## 2019-11-14 DIAGNOSIS — R600 Localized edema: Secondary | ICD-10-CM

## 2019-11-24 ENCOUNTER — Other Ambulatory Visit: Payer: Self-pay

## 2019-11-24 ENCOUNTER — Inpatient Hospital Stay (HOSPITAL_COMMUNITY): Payer: Medicare Other | Attending: Hematology

## 2019-11-24 ENCOUNTER — Other Ambulatory Visit (HOSPITAL_COMMUNITY): Payer: Self-pay

## 2019-11-24 ENCOUNTER — Inpatient Hospital Stay (HOSPITAL_BASED_OUTPATIENT_CLINIC_OR_DEPARTMENT_OTHER): Payer: Medicare Other | Admitting: Hematology

## 2019-11-24 ENCOUNTER — Inpatient Hospital Stay (HOSPITAL_COMMUNITY): Payer: Medicare Other

## 2019-11-24 ENCOUNTER — Other Ambulatory Visit (HOSPITAL_COMMUNITY): Payer: Self-pay | Admitting: *Deleted

## 2019-11-24 VITALS — BP 116/58 | HR 66 | Temp 97.2°F | Resp 18 | Wt 186.2 lb

## 2019-11-24 DIAGNOSIS — C9000 Multiple myeloma not having achieved remission: Secondary | ICD-10-CM | POA: Diagnosis not present

## 2019-11-24 DIAGNOSIS — Z5112 Encounter for antineoplastic immunotherapy: Secondary | ICD-10-CM | POA: Insufficient documentation

## 2019-11-24 DIAGNOSIS — R600 Localized edema: Secondary | ICD-10-CM

## 2019-11-24 LAB — CBC WITH DIFFERENTIAL/PLATELET
Abs Immature Granulocytes: 0 10*3/uL (ref 0.00–0.07)
Basophils Absolute: 0 10*3/uL (ref 0.0–0.1)
Basophils Relative: 0 %
Eosinophils Absolute: 0 10*3/uL (ref 0.0–0.5)
Eosinophils Relative: 0 %
HCT: 40.6 % (ref 39.0–52.0)
Hemoglobin: 13.6 g/dL (ref 13.0–17.0)
Immature Granulocytes: 0 %
Lymphocytes Relative: 30 %
Lymphs Abs: 1.6 10*3/uL (ref 0.7–4.0)
MCH: 31 pg (ref 26.0–34.0)
MCHC: 33.5 g/dL (ref 30.0–36.0)
MCV: 92.5 fL (ref 80.0–100.0)
Monocytes Absolute: 0.5 10*3/uL (ref 0.1–1.0)
Monocytes Relative: 10 %
Neutro Abs: 3.1 10*3/uL (ref 1.7–7.7)
Neutrophils Relative %: 60 %
Platelets: 105 10*3/uL — ABNORMAL LOW (ref 150–400)
RBC: 4.39 MIL/uL (ref 4.22–5.81)
RDW: 13.9 % (ref 11.5–15.5)
WBC: 5.2 10*3/uL (ref 4.0–10.5)
nRBC: 0 % (ref 0.0–0.2)

## 2019-11-24 LAB — COMPREHENSIVE METABOLIC PANEL
ALT: 11 U/L (ref 0–44)
AST: 13 U/L — ABNORMAL LOW (ref 15–41)
Albumin: 3.5 g/dL (ref 3.5–5.0)
Alkaline Phosphatase: 18 U/L — ABNORMAL LOW (ref 38–126)
Anion gap: 10 (ref 5–15)
BUN: 17 mg/dL (ref 8–23)
CO2: 27 mmol/L (ref 22–32)
Calcium: 8.5 mg/dL — ABNORMAL LOW (ref 8.9–10.3)
Chloride: 104 mmol/L (ref 98–111)
Creatinine, Ser: 0.66 mg/dL (ref 0.61–1.24)
GFR, Estimated: >60 mL/min (ref >60)
Glucose, Bld: 89 mg/dL (ref 70–99)
Potassium: 3.8 mmol/L (ref 3.5–5.1)
Sodium: 141 mmol/L (ref 135–145)
Total Bilirubin: 0.7 mg/dL (ref 0.3–1.2)
Total Protein: 5.2 g/dL — ABNORMAL LOW (ref 6.5–8.1)

## 2019-11-24 LAB — MAGNESIUM: Magnesium: 2.1 mg/dL (ref 1.7–2.4)

## 2019-11-24 MED ORDER — DIAZEPAM 10 MG PO TABS: 10.0000 mg | ORAL_TABLET | Freq: Every evening | ORAL | 0 refills | Status: DC | PRN

## 2019-11-24 MED ORDER — FUROSEMIDE 20 MG PO TABS: 20.0000 mg | ORAL_TABLET | Freq: Every day | ORAL | 2 refills | Status: DC | PRN

## 2019-11-24 MED ORDER — ZOLEDRONIC ACID 4 MG/100ML IV SOLN
4.0000 mg | Freq: Once | INTRAVENOUS | Status: AC
  Administered 2019-11-24: 4 mg via INTRAVENOUS
  Filled 2019-11-24: qty 100

## 2019-11-24 MED ORDER — DARATUMUMAB-HYALURONIDASE-FIHJ 1800-30000 MG-UT/15ML ~~LOC~~ SOLN
1800.0000 mg | Freq: Once | SUBCUTANEOUS | Status: AC
  Administered 2019-11-24: 1800 mg via SUBCUTANEOUS
  Filled 2019-11-24: qty 15

## 2019-11-24 MED ORDER — SODIUM CHLORIDE 0.9 % IV SOLN: Freq: Once | INTRAVENOUS | Status: AC

## 2019-11-24 MED ORDER — ACETAMINOPHEN 325 MG PO TABS
650.0000 mg | ORAL_TABLET | Freq: Once | ORAL | Status: AC
  Administered 2019-11-24: 650 mg via ORAL
  Filled 2019-11-24: qty 2

## 2019-11-24 MED ORDER — SODIUM CHLORIDE 0.9% FLUSH
10.0000 mL | INTRAVENOUS | Status: DC | PRN
  Administered 2019-11-24: 10 mL

## 2019-11-24 MED ORDER — DEXAMETHASONE 4 MG PO TABS
20.0000 mg | ORAL_TABLET | Freq: Once | ORAL | Status: AC
  Administered 2019-11-24: 20 mg via ORAL
  Filled 2019-11-24: qty 5

## 2019-11-24 MED ORDER — HEPARIN SOD (PORK) LOCK FLUSH 100 UNIT/ML IV SOLN
500.0000 [IU] | Freq: Once | INTRAVENOUS | Status: AC | PRN
  Administered 2019-11-24: 500 [IU]

## 2019-11-24 MED ORDER — PROCHLORPERAZINE MALEATE 10 MG PO TABS
10.0000 mg | ORAL_TABLET | Freq: Once | ORAL | Status: AC
  Administered 2019-11-24: 10 mg via ORAL
  Filled 2019-11-24: qty 1

## 2019-11-24 MED ORDER — DIPHENHYDRAMINE HCL 25 MG PO CAPS
50.0000 mg | ORAL_CAPSULE | Freq: Once | ORAL | Status: AC
  Administered 2019-11-24: 50 mg via ORAL
  Filled 2019-11-24: qty 2

## 2019-11-24 MED ORDER — LENALIDOMIDE 10 MG PO CAPS: 10.0000 mg | ORAL_CAPSULE | Freq: Every day | ORAL | 0 refills | Status: DC

## 2019-11-24 NOTE — Telephone Encounter (Signed)
Revlimid prescription sent to [facility] via Fax.

## 2019-11-24 NOTE — Patient Instructions (Addendum)
[place] at [facility] Discharge Instructions  You were seen today by Dr. Delton Coombes. He went over your recent results. You received your treatment today. You will be prescribed Valium (diazepam) to take at bedtime for your sleep; stop taking the trazodone and Xanax. You will also be started on Revlimid 10 mg to take 3 weeks on and 1 week off; start taking it as soon as you receive it. Dr. Delton Coombes will see you back in 4 weeks for labs and follow up.   Thank you for choosing [place] at [facility] to provide your oncology and hematology care.  To afford each patient quality time with our provider, please arrive at least 15 minutes before your scheduled appointment time.   If you have a lab appointment with the [facility] please come in thru the Main Entrance and check in at the main information desk  You need to re-schedule your appointment should you arrive 10 or more minutes late.  We strive to give you quality time with our providers, and arriving late affects you and other patients whose appointments are after yours.  Also, if you no show three or more times for appointments you may be dismissed from the clinic at the providers discretion.     Again, thank you for choosing [facility].  Our hope is that these requests will decrease the amount of time that you wait before being seen by our physicians.       _____________________________________________________________  Should you have questions after your visit to [facility], please contact our office at [phone] between the hours of 8:00 a.m. and 4:30 p.m.  Voicemails left after 4:00 p.m. will not be returned until the following business day.  For prescription refill requests, have your pharmacy contact our office and allow 72 hours.    Cancer Center Support Programs:   > Cancer Support Group  2nd Tuesday of the month 1pm-2pm, Journey Room

## 2019-11-24 NOTE — Progress Notes (Signed)
Dean [name], [name] [id number]   CLINIC:  Medical Oncology/Hematology  PCP:  [name], MD [address] [place] [address] [phone]   REASON FOR VISIT:  Follow-up for IgG kappa multiple myeloma  PRIOR THERAPY: Velcade x 6 cycles from 04/07/2014 to 07/28/2014; x 7 cycles from 02/18/2017 to 08/26/2017  NGS Results: Not done  CURRENT THERAPY: Darzalex Faspro every 4 weeks  BRIEF ONCOLOGIC HISTORY:  Oncology History  Multiple myeloma without remission ([place])  11/16/2012 Initial Diagnosis   L occipital condyle destructive lesion, not biopsied secondary to location. XRT   12/01/2012 Imaging   L3 superior endplate compression FX, no lytic lesions to suggest myeloma   02/06/2014 Imaging   soft tissue mass along the right posterior fifth rib with some rib destruction   02/16/2014 Miscellaneous   Normal CBC, Normal CMP, kappa,lamda ratio of 2.62 (abnormal), UIEP with slightly restricted band, IgG kappa, SPEP/IEP with monoclonal protein at 0.79 g/dl, normal igG, suppressed IGM   02/20/2014 Bone Marrow Biopsy   33% kappa restricted plasma cells Normal FISH, normal cytogenetics   02/21/2014 - 03/08/2014 Radiation Therapy   30Gy to R rib lesion, lesion not biopsied   03/16/2014 PET scan   Scattered hypermetabolic osseous lesions, including the calvarium, ribs, left scapula, sacrum, and left femoral shaft. An expansile left vertebral body lesion at L3 extends into the epidural space of the left lateral recess,    03/17/2014 - 04/07/2014 Chemotherapy   Velcade and Dexamethasone due to initial denial of Revlimid by insurance company.  Zometa monthly.   03/22/2014 Imaging   MR_ L-Spine- Enhancing lesions compatible with multiple myeloma at L2, L3, and S1-2.   04/07/2014 - 07/28/2014 Chemotherapy   RVD with Revlimid at 25 mg days 1-14.  Revlimid dose reduced to 25 mg every other day x 14 days with 7 day respite beginning on day 1 of  cycle 2.   04/17/2014 Adverse Reaction   Revlimid-induced rash.  Treated with steroids.   07/28/2014 - 08/04/2014 Chemotherapy   Revlimid daily   08/04/2014 Adverse Reaction   Velcade-induced peripheral neuropathy   08/04/2014 Treatment Plan Change   D/C Velcade   08/11/2014 PET scan   Response to therapy. Improvement and resolution of foci of osseous hypermetabolism.   08/22/2014 - 08/28/2014 Chemotherapy   Revlimid 25 mg days 1-21 every 28 days   08/28/2014 Adverse Reaction   Revlimid-induced rash. Medication held.  Medrol dose Pak prescribed.   09/04/2014 - 01/08/2015 Chemotherapy   Revlimid 10 mg every other day, without dexamethasone   01/08/2015 - 01/15/2015 Hospital Admission   Colonic diverticular abscess with IR drain placement   03/02/2015 PET scan   Only bony uptake is low activity at site of deformity/callus at R second rib FX, high activity in sigmoid colon, advanced sigmoid divertidulosis. Abscess not resolved?   06/04/2015 Imaging   CT nonobstructive L renal calculus, diverticulosis of descending and sigmoid colon without inflammation, moderate prostatic enlargement   07/12/2015 Surgery   Diverticulitis s/p robotic sigmoid colectomy with Dr. Johney Maine   08/23/2015 PET scan   Primarily similar hypermetabolic osseous foci within the R sided ribs. new L third rib focus of hypermetab and sclerosis is favored to be related to healing FX. no soft tissue myeloma id'd. Presumed postop hypermetab and edema about the R pelvic wall    03/06/2016 PET scan   1. Reduced activity in the prior lesion such as the right second and  fifth rib lesions, activity nearly completely resolved and significantly less than added mediastinal blood pool activity. No new lesions are identified. 2. Other imaging findings of potential clinical significance: Coronary, aortic arch, and branch vessel atherosclerotic vascular disease. Aortoiliac atherosclerotic vascular disease. Enlarged prostate gland. Colonic  diverticula.   02/17/2017 PET scan   HEAD/NECK: No hypermetabolic activity in the scalp. No hypermetabolic cervical lymph nodes. CHEST: No hypermetabolic mediastinal or hilar nodes. Right upper lobe scarring/atelectasis. No suspicious pulmonary nodules on the CT scan. ABDOMEN/PELVIS: No abnormal hypermetabolic activity within the liver, pancreas, adrenal glands, or spleen. No hypermetabolic lymph nodes in the abdomen or pelvis. Atherosclerotic calcifications of the abdominal aorta and branch vessels. Left renal sinus cysts. Sigmoid diverticulosis, without evidence of diverticulitis. Prostatomegaly. SKELETON: Vague hypermetabolism involving the inferior sternum, max SUV 3.2, previously 8.2. Focal hypermetabolism involving the left sacrum, max SUV 4.0, previously 6.7. EXTREMITIES: No abnormal hypermetabolic activity in the lower extremities.   02/18/2017 -  Chemotherapy   Bortezomib 1.$RemoveBefor'3mg'mtbgkmPbpHAD$ /m2 QWk + Dexamethasone $RemoveBeforeD'10mg'JDkYHsZPSBAKMg$  QTue/Fri --Cycle #1, 02/18/17    02/18/2017 - 08/26/2017 Chemotherapy   The patient had bortezomib SQ (VELCADE) chemo injection 3 mg, 1.3 mg/m2 = 3 mg, Subcutaneous,  Once, 7 of 9 cycles Administration: 3 mg (02/18/2017), 3 mg (02/26/2017), 3 mg (03/04/2017), 3 mg (03/11/2017), 3 mg (04/08/2017), 3 mg (03/18/2017), 3 mg (03/25/2017), 3 mg (04/01/2017), 3 mg (05/06/2017), 3 mg (05/20/2017), 3 mg (06/03/2017), 3 mg (06/17/2017), 3 mg (07/01/2017), 3 mg (07/15/2017), 3 mg (07/29/2017), 3 mg (08/12/2017), 3 mg (08/26/2017)  for chemotherapy treatment.    09/17/2017 -  Chemotherapy   The patient had dexamethasone (DECADRON) tablet 20 mg, 20 mg (100 % of original dose 20 mg), Oral,  Once, 8 of 10 cycles Dose modification: 20 mg (original dose 20 mg, Cycle 17) Administration: 20 mg (01/17/2019), 20 mg (02/16/2019), 20 mg (03/16/2019), 20 mg (06/23/2019), 20 mg (07/21/2019), 20 mg (08/18/2019), 20 mg (09/15/2019), 20 mg (10/13/2019) daratumumab-hyaluronidase-fihj (DARZALEX FASPRO) 1800-30000 MG-UT/15ML chemo SQ injection  1,800 mg, 1,800 mg, Subcutaneous,  Once, 11 of 13 cycles Administration: 1,800 mg (10/25/2018), 1,800 mg (11/22/2018), 1,800 mg (12/20/2018), 1,800 mg (01/17/2019), 1,800 mg (02/16/2019), 1,800 mg (03/16/2019), 1,800 mg (06/23/2019), 1,800 mg (07/21/2019), 1,800 mg (08/18/2019), 1,800 mg (09/15/2019), 1,800 mg (10/13/2019) daratumumab (DARZALEX) 1,800 mg in sodium chloride 0.9 % 910 mL (1.8 mg/mL) chemo infusion, 15.9 mg/kg = 1,820 mg, Intravenous, Once, 1 of 1 cycle Administration: 1,800 mg (09/17/2017) daratumumab (DARZALEX) 1,800 mg in sodium chloride 0.9 % 410 mL (3.6 mg/mL) chemo infusion, 1,820 mg, Intravenous, Once, 13 of 13 cycles Administration: 1,800 mg (10/28/2017), 1,800 mg (11/04/2017), 1,800 mg (11/11/2017), 1,800 mg (11/18/2017), 1,800 mg (11/25/2017), 1,800 mg (12/02/2017), 1,800 mg (12/09/2017), 1,800 mg (12/16/2017), 1,800 mg (12/30/2017), 1,800 mg (04/26/2018), 1,800 mg (01/13/2018), 1,800 mg (02/12/2018), 1,800 mg (02/26/2018), 1,800 mg (03/15/2018), 1,800 mg (03/29/2018), 1,800 mg (04/12/2018), 1,800 mg (05/10/2018), 1,800 mg (06/07/2018), 1,800 mg (07/05/2018), 1,800 mg (08/02/2018), 1,800 mg (08/30/2018), 1,800 mg (09/27/2018)  for chemotherapy treatment.      CANCER STAGING: Cancer Staging No matching staging information was found for the patient.  INTERVAL HISTORY:  Mr. Bristol Soy., a 84 y.o. male, returns for routine follow-up and consideration for next cycle of immunotherapy. Damone was last seen on 10/13/2019.  Due for cycle #25 of Darzalex Faspro today.   Overall, he tells me he has been feeling bad. He reports having trouble staying asleep; he is taking 200 mg of trazodone and 2 mg of Xanax at bedtime. He  takes Lasix for his swollen legs as needed. He takes Decadron 10 mg weekly. He denies N/V, numbness or tingling. He complains of having pain in his back and hips; he fell a couple times after trying to sit down in a chair. His appetite is decreased and he will eat only 1 bite, but no more.   He is taking care of his wife and his daughter is helping with preparing meals.  Overall, he feels ready for next cycle of immunotherapy today.    REVIEW OF SYSTEMS:  Review of Systems  Constitutional: Positive for appetite change (depleted) and fatigue (depleted).  Cardiovascular: Positive for leg swelling (feet and ankles).  Gastrointestinal: Negative for nausea and vomiting.  Musculoskeletal: Positive for back pain (10/10 back and hip pain).  Neurological: Positive for headaches (occasional).  Psychiatric/Behavioral: Positive for sleep disturbance (trouble falling and staying asleep).  All other systems reviewed and are negative.   PAST MEDICAL/SURGICAL HISTORY:  Past Medical History:  Diagnosis Date  . BPH (benign prostatic hyperplasia)   . Colonic diverticular abscess 01/08/2015  . Diverticulitis 33/82/50   complicated by abscess and required percutaneous drainage  . GERD (gastroesophageal reflux disease)   . H/O ETOH abuse   . History of chemotherapy last done jan 2017  . History of radiation therapy 01/05/13-02/10/13   45 gray to left occipital condyle region  . History of radiation therapy 12/01/16-12/10/16   Parasternal nodule, chest- 24 Gy total delivered in 8 fractions, Left sacro-iliac, pelvis- 24 Gy total delivered in 8 fractions   . History of radiation therapy 02/19/17-02/22/17   right temporal scalp 30 Gy in 10 fractions  . Intra-abdominal abscess ([place])   . Multiple myeloma ([place]) 2015  . Neuropathy   . Radiation 02/21/14-03/08/14   right posterior chest wall area 30 gray  . Radiation 04/19/14-05/02/14   lumbar spine 25 gray  . Skull lesion    Left occipital condyle  . Wrist fracture, left    x 2   Past Surgical History:  Procedure Laterality Date  . COLONOSCOPY N/A 04/19/2015   Procedure: COLONOSCOPY;  Surgeon: Danie Binder, MD;  Location: [facility];  Service: Endoscopy;  Laterality: N/A;  1:30 PM  . CYST EXCISION  1959   tail bone  . IR IMAGING GUIDED  PORT INSERTION  09/25/2017    SOCIAL HISTORY:  Social History   Socioeconomic History  . Marital status: Married    Spouse name: Not on file  . Number of children: 3  . Years of education: Not on file  . Highest education level: Not on file  Occupational History    Employer: RETIRED  Tobacco Use  . Smoking status: Never Smoker  . Smokeless tobacco: Never Used  Vaping Use  . Vaping Use: Never used  Substance and Sexual Activity  . Alcohol use: No    Comment: " Not much no more"  . Drug use: No  . Sexual activity: Not on file  Other Topics Concern  . Not on file  Social History Narrative  . Not on file   Social Determinants of Health   Financial Resource Strain:   . Difficulty of Paying Living Expenses: Not on file  Food Insecurity:   . Worried About Charity fundraiser in the Last Year: Not on file  . Ran Out of Food in the Last Year: Not on file  Transportation Needs:   . Lack of Transportation (Medical): Not on file  . Lack of Transportation (Non-Medical): Not on  file  Physical Activity:   . Days of Exercise per Week: Not on file  . Minutes of Exercise per Session: Not on file  Stress:   . Feeling of Stress : Not on file  Social Connections:   . Frequency of Communication with Friends and Family: Not on file  . Frequency of Social Gatherings with Friends and Family: Not on file  . Attends Religious Services: Not on file  . Active Member of Clubs or Organizations: Not on file  . Attends Archivist Meetings: Not on file  . Marital Status: Not on file  Intimate Partner Violence:   . Fear of Current or Ex-Partner: Not on file  . Emotionally Abused: Not on file  . Physically Abused: Not on file  . Sexually Abused: Not on file    FAMILY HISTORY:  Family History  Problem Relation Age of Onset  . Diabetes Mother   . Stroke Mother   . Hypertension Father     CURRENT MEDICATIONS:  Current Outpatient Medications  Medication Sig Dispense Refill  .  acyclovir (ZOVIRAX) 400 MG tablet Take 1 tablet (400 mg total) by mouth 2 (two) times daily. 60 tablet 3  . ALPRAZolam (XANAX) 1 MG tablet TAKE 1 TABLET BY MOUTH 3 TIMES DAILY AS NEEDED FOR ANXIETY 60 tablet 3  . CALCIUM-VITAMIN D PO Take 1 tablet by mouth 2 (two) times daily.     Marland Kitchen dexamethasone (DECADRON) 4 MG tablet Take 2.5 tablets (10 mg total) by mouth once a week. Take 10 mg once weekly 10 tablet 12  . diclofenac Sodium (VOLTAREN) 1 % GEL Apply topically as needed. Pt has not picked up yet    . fluticasone (FLONASE) 50 MCG/ACT nasal spray Place 2 sprays into both nostrils daily. 16 g 6  . furosemide (LASIX) 20 MG tablet TAKE 1 TABLET BY MOUTH DAILY AS NEEDED FOR EDEMA 30 tablet 0  . gabapentin (NEURONTIN) 400 MG capsule TAKE 1 CAPSULE BY MOUTH 3 TIMES DAILY 90 capsule 4  . Homeopathic Products ([place]) Apply topically as needed.    . lidocaine-prilocaine (EMLA) cream Apply small amount over port one (1) hour prior to appointment. 30 g 0  . MAGNESIUM PO Take 1 tablet by mouth at bedtime.    . montelukast (SINGULAIR) 10 MG tablet Take 1 tablet (10 mg total) by mouth at bedtime. 30 tablet 3  . omeprazole (PRILOSEC) 20 MG capsule TAKE 1 CAPSULE BY MOUTH ONCE DAILY 30 capsule 5  . Oxycodone HCl 10 MG TABS Take 1 tablet (10 mg total) by mouth every 8 (eight) hours as needed. for pain 120 tablet 0  . senna-docusate (SENOKOT-S) 8.6-50 MG tablet Take 1 tablet by mouth 2 (two) times daily. 60 tablet 5  . sulfamethoxazole-trimethoprim (BACTRIM DS) 800-160 MG tablet Take 1 tablet by mouth 2 (two) times daily. 42 tablet 0  . Suvorexant (BELSOMRA) 10 MG TABS Take 10 mg by mouth at bedtime as needed. 30 tablet 3  . tamsulosin (FLOMAX) 0.4 MG CAPS capsule TAKE ONE CAPSULE BY MOUTH EVERY NIGHT ATBEDTIME 30 capsule 11  . tiZANidine (ZANAFLEX) 4 MG tablet Take 1 tablet (4 mg total) by mouth every 6 (six) hours as needed (headache). 30 tablet 0  . traZODone (DESYREL) 100 MG tablet TAKE 1 TABLET (  $'100MG'V$  TOTAL ) BY MOUTH AT BEDTIME . MAY  TITRATE UP TO $Rem'300MG'SyJe$  NIGHTLY 60 tablet 3  . zolendronic acid (ZOMETA) 4 MG/5ML injection Inject 4 mg into the vein every  30 (thirty) days.      No current facility-administered medications for this visit.    ALLERGIES:  Allergies  Allergen Reactions  . Codeine Other (See Comments)    Headache  . Morphine And Related Other (See Comments)    Makes him feel weird  . Revlimid [Lenalidomide] Other (See Comments)    "Causes me to become weak"    PHYSICAL EXAM:  Performance status (ECOG): 1 - Symptomatic but completely ambulatory  Vitals:   11/24/19 0812  BP: (!) 116/58  Pulse: 66  Resp: 18  Temp: (!) 97.2 F (36.2 C)  SpO2: 98%   Wt Readings from Last 3 Encounters:  11/24/19 186 lb 3.2 oz (84.5 kg)  10/13/19 192 lb (87.1 kg)  09/15/19 194 lb 9.6 oz (88.3 kg)   Physical Exam Vitals reviewed.  Constitutional:      Appearance: Normal appearance.  Cardiovascular:     Rate and Rhythm: Normal rate and regular rhythm.     Pulses: Normal pulses.     Heart sounds: Normal heart sounds.  Pulmonary:     Effort: Pulmonary effort is normal.     Breath sounds: Normal breath sounds.  Chest:     Comments: Port-a-Cath in R chest Musculoskeletal:     Right lower leg: No edema.     Left lower leg: No edema.  Neurological:     General: No focal deficit present.     Mental Status: He is alert and oriented to person, place, and time.  Psychiatric:        Mood and Affect: Mood normal.        Behavior: Behavior normal.     LABORATORY DATA:  I have reviewed the labs as listed.  CBC Latest Ref Rng & Units 11/24/2019 10/13/2019 09/15/2019  WBC 4.0 - 10.5 K/uL 5.2 5.1 4.0  Hemoglobin 13.0 - 17.0 g/dL 13.6 12.8(L) 13.2  Hematocrit 39 - 52 % 40.6 40.2 40.9  Platelets 150 - 400 K/uL 105(L) 159 107(L)   CMP Latest Ref Rng & Units 11/24/2019 10/13/2019 09/15/2019  Glucose 70 - 99 mg/dL 89 90 94  BUN 8 - 23 mg/dL 17 24(H) 20  Creatinine 0.61 - 1.24 mg/dL  0.66 0.61 0.66  Sodium 135 - 145 mmol/L 141 140 142  Potassium 3.5 - 5.1 mmol/L 3.8 4.3 4.0  Chloride 98 - 111 mmol/L 104 105 105  CO2 22 - 32 mmol/L $RemoveB'27 27 28  'ziIjvSDo$ Calcium 8.9 - 10.3 mg/dL 8.5(L) 8.8(L) 8.9  Total Protein 6.5 - 8.1 g/dL 5.2(L) 5.2(L) 5.4(L)  Total Bilirubin 0.3 - 1.2 mg/dL 0.7 0.5 0.6  Alkaline Phos 38 - 126 U/L 18(L) 20(L) 18(L)  AST 15 - 41 U/L 13(L) 12(L) 14(L)  ALT 0 - 44 U/L $Remo'11 10 12   'KWgaK$ Lab Results  Component Value Date   LDH 124 10/13/2019   LDH 117 08/18/2019   LDH 119 07/21/2019   Lab Results  Component Value Date   TOTALPROTELP 5.2 (L) 10/13/2019   ALBUMINELP 3.1 10/13/2019   A1GS 0.2 10/13/2019   A2GS 0.7 10/13/2019   BETS 0.7 10/13/2019   BETA2SER 5.3 04/07/2014   GAMS 0.5 10/13/2019   MSPIKE 0.3 (H) 10/13/2019   SPEI Comment 10/13/2019    Lab Results  Component Value Date   KPAFRELGTCHN 22.6 (H) 10/13/2019   LAMBDASER 4.7 (L) 10/13/2019   KAPLAMBRATIO 4.81 (H) 10/13/2019    DIAGNOSTIC IMAGING:  I have independently reviewed the scans and discussed with the patient. No results found.  ASSESSMENT:  1.Recurrent IgG kappa multiple myeloma: -On monthly daratumumab and dexamethasone. -Revlimid 10 mg 3 weeks on 1 week off started on 11/24/2019.   PLAN:  1. IgG kappa myeloma: -We reviewed labs from 10/13/2019.  SPEP increased to 0.3 g.  Kappa light chains increased to 22.6 and ratio 4.81. -He is tolerating monthly daratumumab.  He is taking dexamethasone 10 mg weekly. -I have recommended adding Revlimid.  He could not tolerate Pomalyst in the past because of severe skin rash. -I will start Revlimid 10 mg 3 weeks on 1 week off. -RTC 4 weeks with labs and treatment.  2. Low back pain: -Continue oxycodone as needed.  He takes anywhere between 0-3 pills/day.  3. Peripheral neuropathy: -Continue gabapentin 3 times a day.  4. Bone protection: -Continue Zometa every 8 weeks.  Calcium is 8.5.  5. Sleeping difficulty: -He is taking  trazodone 200 mg at bedtime along with Xanax 2 mg.  Still not helping. -I will discontinue above medications.  We will start him on Valium 10 mg at bedtime.  6. Hypokalemia: -Continue potassium supplements.  Potassium today is 4.3.   Orders placed this encounter:  No orders of the defined types were placed in this encounter.    Derek Jack, MD [place] [phone]   I, Milinda Antis, am acting as a scribe for Dr. Sanda Linger.  I, Derek Jack MD, have reviewed the above documentation for accuracy and completeness, and I agree with the above.

## 2019-11-24 NOTE — Patient Instructions (Signed)
[facility] Discharge Instructions for Patients Receiving Chemotherapy  Today you received the following chemotherapy agents   To help prevent nausea and vomiting after your treatment, we encourage you to take your nausea medication   If you develop nausea and vomiting that is not controlled by your nausea medication, call the clinic.   BELOW ARE SYMPTOMS THAT SHOULD BE REPORTED IMMEDIATELY:  *FEVER GREATER THAN 100.5 F  *CHILLS WITH OR WITHOUT FEVER  NAUSEA AND VOMITING THAT IS NOT CONTROLLED WITH YOUR NAUSEA MEDICATION  *UNUSUAL SHORTNESS OF BREATH  *UNUSUAL BRUISING OR BLEEDING  TENDERNESS IN MOUTH AND THROAT WITH OR WITHOUT PRESENCE OF ULCERS  *URINARY PROBLEMS  *BOWEL PROBLEMS  UNUSUAL RASH Items with * indicate a potential emergency and should be followed up as soon as possible.  Feel free to call the clinic should you have any questions or concerns. The clinic phone number is [phone].  Please show the CHEMO ALERT CARD at check-in to the Emergency Department and triage nurse.   

## 2019-11-24 NOTE — Progress Notes (Signed)
Pt here for D1C25 dara subcutaneous.  He is also due for Zometa.  Calcium level is 8.5.  Labs reviewed with Dr Raliegh Ip okay for treatment.  New Zometa consent obtained.  Tolerated treatment well today without incidence.  Discharged in stable condition via wheelchair in stable condition.

## 2019-11-24 NOTE — Patient Instructions (Signed)
[facility] at [facility] Discharge Instructions  Labs drawn from portacath today   Thank you for choosing [facility] at [facility] to provide your oncology and hematology care.  To afford each patient quality time with our provider, please arrive at least 15 minutes before your scheduled appointment time.   If you have a lab appointment with the Cancer Center please come in thru the Main Entrance and check in at the main information desk.  You need to re-schedule your appointment should you arrive 10 or more minutes late.  We strive to give you quality time with our providers, and arriving late affects you and other patients whose appointments are after yours.  Also, if you no show three or more times for appointments you may be dismissed from the clinic at the providers discretion.     Again, thank you for choosing [facility].  Our hope is that these requests will decrease the amount of time that you wait before being seen by our physicians.       _____________________________________________________________  Should you have questions after your visit to [facility], please contact our office at [phone] and follow the prompts.  Our office hours are 8:00 a.m. and 4:30 p.m. Monday - Friday.  Please note that voicemails left after 4:00 p.m. may not be returned until the following business day.  We are closed weekends and major holidays.  You do have access to a nurse 24-7, just call the main number to the clinic [phone] and do not press any options, hold on the line and a nurse will answer the phone.    For prescription refill requests, have your pharmacy contact our office and allow 72 hours.    Due to Covid, you will need to wear a mask upon entering the hospital. If you do not have a mask, a mask will be given to you at the Main Entrance upon arrival. For doctor visits, patients may have 1 support person age 18  or older with them. For treatment visits, patients can not have anyone with them due to social distancing guidelines and our immunocompromised population.     

## 2019-11-24 NOTE — Progress Notes (Signed)
Patient was assessed by Dr. Delton Coombes and labs have been reviewed.  He will be starting Revlimid.  We will send that to specialty pharmacy.  Patient is okay to proceed with treatment today. Primary RN and pharmacy aware.

## 2019-12-01 ENCOUNTER — Other Ambulatory Visit: Payer: Self-pay | Admitting: Family Medicine

## 2019-12-01 DIAGNOSIS — N400 Enlarged prostate without lower urinary tract symptoms: Secondary | ICD-10-CM

## 2019-12-05 ENCOUNTER — Telehealth (HOSPITAL_COMMUNITY): Payer: Self-pay | Admitting: Pharmacy Technician

## 2019-12-05 NOTE — Telephone Encounter (Addendum)
Oral Oncology Patient Advocate Encounter  Prior Authorization for Revlimid has been approved.    PA# [id number] Effective dates: 09/06/19 through 12/04/20  [place] to let them know of approval.  Patients copay is $3.73.  Oral Oncology Clinic will continue to follow.   [place] [facility] [place] Phone [phone] Fax [phone] 12/05/2019 12:06 PM

## 2019-12-05 NOTE — Telephone Encounter (Signed)
Oral Oncology Patient Advocate Encounter  Received notification from [facility] that prior authorization for Revlimid is required.  PA submitted on CoverMyMeds Key [id number]  Status is pending  Oral Oncology Clinic will continue to follow. [place] [facility] [place] Phone [phone] Fax [phone] 12/05/2019 11:57 AM

## 2019-12-06 ENCOUNTER — Telehealth (HOSPITAL_COMMUNITY): Payer: Self-pay | Admitting: Surgery

## 2019-12-06 NOTE — Telephone Encounter (Signed)
Teacher, English as a foreign language called to let our office know that Dean [name] does not want to take the Revlimid because it gives him a rash.  Dr. Delton Coombes notified.

## 2019-12-07 ENCOUNTER — Encounter (HOSPITAL_COMMUNITY): Payer: Self-pay

## 2019-12-07 NOTE — Progress Notes (Signed)
I have called the patient to discuss his hesitancy in taking Revlimid. Patient states that he has taken this medication in the past and was taken off of it due to a skin rash. Patient to follow-up with Dr. Delton Coombes at his next appt. Dr. Delton Coombes aware that patient is request to not take Revlimid.

## 2019-12-08 ENCOUNTER — Telehealth (HOSPITAL_COMMUNITY): Payer: Self-pay

## 2019-12-08 NOTE — Telephone Encounter (Signed)
Dean [name] with [employer] called to let us know that she spoke with patient concerning Revlimid and he told her he is not going to start it. She can be reached at [phone] if you have any questions.  Dr. Raliegh Ip made aware.

## 2019-12-22 ENCOUNTER — Other Ambulatory Visit (HOSPITAL_COMMUNITY): Payer: Self-pay | Admitting: *Deleted

## 2019-12-22 ENCOUNTER — Other Ambulatory Visit: Payer: Self-pay

## 2019-12-22 ENCOUNTER — Inpatient Hospital Stay (HOSPITAL_COMMUNITY): Payer: Medicare Other

## 2019-12-22 ENCOUNTER — Inpatient Hospital Stay (HOSPITAL_COMMUNITY): Payer: Medicare Other | Attending: Hematology

## 2019-12-22 ENCOUNTER — Inpatient Hospital Stay (HOSPITAL_BASED_OUTPATIENT_CLINIC_OR_DEPARTMENT_OTHER): Payer: Medicare Other | Admitting: Hematology

## 2019-12-22 ENCOUNTER — Encounter (HOSPITAL_COMMUNITY): Payer: Self-pay | Admitting: Hematology

## 2019-12-22 VITALS — BP 111/67 | HR 60 | Temp 97.2°F | Resp 18

## 2019-12-22 VITALS — BP 122/54 | HR 76 | Temp 97.0°F | Resp 18 | Wt 185.4 lb

## 2019-12-22 DIAGNOSIS — C9000 Multiple myeloma not having achieved remission: Secondary | ICD-10-CM

## 2019-12-22 DIAGNOSIS — Z5112 Encounter for antineoplastic immunotherapy: Secondary | ICD-10-CM | POA: Insufficient documentation

## 2019-12-22 DIAGNOSIS — G4700 Insomnia, unspecified: Secondary | ICD-10-CM

## 2019-12-22 LAB — CBC WITH DIFFERENTIAL/PLATELET
Abs Immature Granulocytes: 0.01 10*3/uL (ref 0.00–0.07)
Basophils Absolute: 0 10*3/uL (ref 0.0–0.1)
Basophils Relative: 1 %
Eosinophils Absolute: 0 10*3/uL (ref 0.0–0.5)
Eosinophils Relative: 0 %
HCT: 39.9 % (ref 39.0–52.0)
Hemoglobin: 13 g/dL (ref 13.0–17.0)
Immature Granulocytes: 0 %
Lymphocytes Relative: 26 %
Lymphs Abs: 1 10*3/uL (ref 0.7–4.0)
MCH: 30.8 pg (ref 26.0–34.0)
MCHC: 32.6 g/dL (ref 30.0–36.0)
MCV: 94.5 fL (ref 80.0–100.0)
Monocytes Absolute: 0.4 10*3/uL (ref 0.1–1.0)
Monocytes Relative: 10 %
Neutro Abs: 2.4 10*3/uL (ref 1.7–7.7)
Neutrophils Relative %: 63 %
Platelets: 124 10*3/uL — ABNORMAL LOW (ref 150–400)
RBC: 4.22 MIL/uL (ref 4.22–5.81)
RDW: 13.9 % (ref 11.5–15.5)
WBC: 3.8 10*3/uL — ABNORMAL LOW (ref 4.0–10.5)
nRBC: 0 % (ref 0.0–0.2)

## 2019-12-22 LAB — COMPREHENSIVE METABOLIC PANEL
ALT: 10 U/L (ref 0–44)
AST: 14 U/L — ABNORMAL LOW (ref 15–41)
Albumin: 3.5 g/dL (ref 3.5–5.0)
Alkaline Phosphatase: 18 U/L — ABNORMAL LOW (ref 38–126)
Anion gap: 7 (ref 5–15)
BUN: 23 mg/dL (ref 8–23)
CO2: 28 mmol/L (ref 22–32)
Calcium: 9 mg/dL (ref 8.9–10.3)
Chloride: 104 mmol/L (ref 98–111)
Creatinine, Ser: 0.84 mg/dL (ref 0.61–1.24)
GFR, Estimated: >60 mL/min (ref >60)
Glucose, Bld: 121 mg/dL — ABNORMAL HIGH (ref 70–99)
Potassium: 3.9 mmol/L (ref 3.5–5.1)
Sodium: 139 mmol/L (ref 135–145)
Total Bilirubin: 0.7 mg/dL (ref 0.3–1.2)
Total Protein: 5.1 g/dL — ABNORMAL LOW (ref 6.5–8.1)

## 2019-12-22 LAB — LACTATE DEHYDROGENASE: LDH: 113 U/L (ref 98–192)

## 2019-12-22 LAB — MAGNESIUM: Magnesium: 1.9 mg/dL (ref 1.7–2.4)

## 2019-12-22 MED ORDER — PROCHLORPERAZINE MALEATE 10 MG PO TABS
10.0000 mg | ORAL_TABLET | Freq: Once | ORAL | Status: AC
  Administered 2019-12-22: 10 mg via ORAL
  Filled 2019-12-22: qty 1

## 2019-12-22 MED ORDER — HEPARIN SOD (PORK) LOCK FLUSH 100 UNIT/ML IV SOLN
500.0000 [IU] | Freq: Once | INTRAVENOUS | Status: AC | PRN
  Administered 2019-12-22: 500 [IU]

## 2019-12-22 MED ORDER — DIPHENHYDRAMINE HCL 25 MG PO CAPS
50.0000 mg | ORAL_CAPSULE | Freq: Once | ORAL | Status: AC
  Administered 2019-12-22: 50 mg via ORAL
  Filled 2019-12-22: qty 2

## 2019-12-22 MED ORDER — TRAZODONE HCL 100 MG PO TABS: ORAL_TABLET | ORAL | 3 refills | Status: DC

## 2019-12-22 MED ORDER — ALPRAZOLAM 1 MG PO TABS: ORAL_TABLET | ORAL | 3 refills | Status: DC

## 2019-12-22 MED ORDER — DARATUMUMAB-HYALURONIDASE-FIHJ 1800-30000 MG-UT/15ML ~~LOC~~ SOLN
1800.0000 mg | Freq: Once | SUBCUTANEOUS | Status: AC
  Administered 2019-12-22: 1800 mg via SUBCUTANEOUS
  Filled 2019-12-22: qty 15

## 2019-12-22 MED ORDER — SODIUM CHLORIDE 0.9 % IV SOLN: Freq: Once | INTRAVENOUS | Status: DC

## 2019-12-22 MED ORDER — ACETAMINOPHEN 325 MG PO TABS
650.0000 mg | ORAL_TABLET | Freq: Once | ORAL | Status: AC
  Administered 2019-12-22: 650 mg via ORAL
  Filled 2019-12-22: qty 2

## 2019-12-22 MED ORDER — DEXAMETHASONE 4 MG PO TABS
20.0000 mg | ORAL_TABLET | Freq: Once | ORAL | Status: AC
  Administered 2019-12-22: 20 mg via ORAL
  Filled 2019-12-22: qty 5

## 2019-12-22 MED ORDER — DEXAMETHASONE 4 MG PO TABS
10.0000 mg | ORAL_TABLET | ORAL | 12 refills | Status: DC
Start: 2019-12-22 — End: 2020-01-09

## 2019-12-22 MED ORDER — OXYCODONE HCL 10 MG PO TABS: 10.0000 mg | ORAL_TABLET | Freq: Three times a day (TID) | ORAL | 0 refills | Status: DC | PRN

## 2019-12-22 MED ORDER — SODIUM CHLORIDE 0.9% FLUSH
10.0000 mL | INTRAVENOUS | Status: DC | PRN
  Administered 2019-12-22: 10 mL

## 2019-12-22 NOTE — Patient Instructions (Signed)
[facility] Discharge Instructions for Patients Receiving Chemotherapy  Today you received the following chemotherapy agents   To help prevent nausea and vomiting after your treatment, we encourage you to take your nausea medication   If you develop nausea and vomiting that is not controlled by your nausea medication, call the clinic.   BELOW ARE SYMPTOMS THAT SHOULD BE REPORTED IMMEDIATELY:  *FEVER GREATER THAN 100.5 F  *CHILLS WITH OR WITHOUT FEVER  NAUSEA AND VOMITING THAT IS NOT CONTROLLED WITH YOUR NAUSEA MEDICATION  *UNUSUAL SHORTNESS OF BREATH  *UNUSUAL BRUISING OR BLEEDING  TENDERNESS IN MOUTH AND THROAT WITH OR WITHOUT PRESENCE OF ULCERS  *URINARY PROBLEMS  *BOWEL PROBLEMS  UNUSUAL RASH Items with * indicate a potential emergency and should be followed up as soon as possible.  Feel free to call the clinic should you have any questions or concerns. The clinic phone number is [phone].  Please show the CHEMO ALERT CARD at check-in to the Emergency Department and triage nurse.   

## 2019-12-22 NOTE — Progress Notes (Signed)
[place], [place] [address]   CLINIC:  Medical Oncology/Hematology  PCP:  [name], MD [address] [place] [address] [phone]   REASON FOR VISIT:  Follow-up for IgG kappa multiple myeloma  PRIOR THERAPY: Velcade x 6 cycles from 04/07/2014 to 07/28/2014; x 7 cycles from 02/18/2017 to 08/26/2017  NGS Results: Not done  CURRENT THERAPY: Darzalex Faspro every 4 weeks  BRIEF ONCOLOGIC HISTORY:  Oncology History  Multiple myeloma without remission ([place])  11/16/2012 Initial Diagnosis   L occipital condyle destructive lesion, not biopsied secondary to location. XRT   12/01/2012 Imaging   L3 superior endplate compression FX, no lytic lesions to suggest myeloma   02/06/2014 Imaging   soft tissue mass along the right posterior fifth rib with some rib destruction   02/16/2014 Miscellaneous   Normal CBC, Normal CMP, kappa,lamda ratio of 2.62 (abnormal), UIEP with slightly restricted band, IgG kappa, SPEP/IEP with monoclonal protein at 0.79 g/dl, normal igG, suppressed IGM   02/20/2014 Bone Marrow Biopsy   33% kappa restricted plasma cells Normal FISH, normal cytogenetics   02/21/2014 - 03/08/2014 Radiation Therapy   30Gy to R rib lesion, lesion not biopsied   03/16/2014 PET scan   Scattered hypermetabolic osseous lesions, including the calvarium, ribs, left scapula, sacrum, and left femoral shaft. An expansile left vertebral body lesion at L3 extends into the epidural space of the left lateral recess,    03/17/2014 - 04/07/2014 Chemotherapy   Velcade and Dexamethasone due to initial denial of Revlimid by insurance company.  Zometa monthly.   03/22/2014 Imaging   MR_ L-Spine- Enhancing lesions compatible with multiple myeloma at L2, L3, and S1-2.   04/07/2014 - 07/28/2014 Chemotherapy   RVD with Revlimid at 25 mg days 1-14.  Revlimid dose reduced to 25 mg every other day x 14 days with 7 day respite beginning on day 1 of  cycle 2.   04/17/2014 Adverse Reaction   Revlimid-induced rash.  Treated with steroids.   07/28/2014 - 08/04/2014 Chemotherapy   Revlimid daily   08/04/2014 Adverse Reaction   Velcade-induced peripheral neuropathy   08/04/2014 Treatment Plan Change   D/C Velcade   08/11/2014 PET scan   Response to therapy. Improvement and resolution of foci of osseous hypermetabolism.   08/22/2014 - 08/28/2014 Chemotherapy   Revlimid 25 mg days 1-21 every 28 days   08/28/2014 Adverse Reaction   Revlimid-induced rash. Medication held.  Medrol dose Pak prescribed.   09/04/2014 - 01/08/2015 Chemotherapy   Revlimid 10 mg every other day, without dexamethasone   01/08/2015 - 01/15/2015 Hospital Admission   Colonic diverticular abscess with IR drain placement   03/02/2015 PET scan   Only bony uptake is low activity at site of deformity/callus at R second rib FX, high activity in sigmoid colon, advanced sigmoid divertidulosis. Abscess not resolved?   06/04/2015 Imaging   CT nonobstructive L renal calculus, diverticulosis of descending and sigmoid colon without inflammation, moderate prostatic enlargement   07/12/2015 Surgery   Diverticulitis s/p robotic sigmoid colectomy with Dr. Johney Maine   08/23/2015 PET scan   Primarily similar hypermetabolic osseous foci within the R sided ribs. new L third rib focus of hypermetab and sclerosis is favored to be related to healing FX. no soft tissue myeloma id'd. Presumed postop hypermetab and edema about the R pelvic wall    03/06/2016 PET scan   1. Reduced activity in the prior lesion such as the right second and  fifth rib lesions, activity nearly completely resolved and significantly less than added mediastinal blood pool activity. No new lesions are identified. 2. Other imaging findings of potential clinical significance: Coronary, aortic arch, and branch vessel atherosclerotic vascular disease. Aortoiliac atherosclerotic vascular disease. Enlarged prostate gland. Colonic  diverticula.   02/17/2017 PET scan   HEAD/NECK: No hypermetabolic activity in the scalp. No hypermetabolic cervical lymph nodes. CHEST: No hypermetabolic mediastinal or hilar nodes. Right upper lobe scarring/atelectasis. No suspicious pulmonary nodules on the CT scan. ABDOMEN/PELVIS: No abnormal hypermetabolic activity within the liver, pancreas, adrenal glands, or spleen. No hypermetabolic lymph nodes in the abdomen or pelvis. Atherosclerotic calcifications of the abdominal aorta and branch vessels. Left renal sinus cysts. Sigmoid diverticulosis, without evidence of diverticulitis. Prostatomegaly. SKELETON: Vague hypermetabolism involving the inferior sternum, max SUV 3.2, previously 8.2. Focal hypermetabolism involving the left sacrum, max SUV 4.0, previously 6.7. EXTREMITIES: No abnormal hypermetabolic activity in the lower extremities.   02/18/2017 -  Chemotherapy   Bortezomib 1.36m/m2 QWk + Dexamethasone 167mQTue/Fri --Cycle #1, 02/18/17    02/18/2017 - 08/26/2017 Chemotherapy   The patient had bortezomib SQ (VELCADE) chemo injection 3 mg, 1.3 mg/m2 = 3 mg, Subcutaneous,  Once, 7 of 9 cycles Administration: 3 mg (02/18/2017), 3 mg (02/26/2017), 3 mg (03/04/2017), 3 mg (03/11/2017), 3 mg (04/08/2017), 3 mg (03/18/2017), 3 mg (03/25/2017), 3 mg (04/01/2017), 3 mg (05/06/2017), 3 mg (05/20/2017), 3 mg (06/03/2017), 3 mg (06/17/2017), 3 mg (07/01/2017), 3 mg (07/15/2017), 3 mg (07/29/2017), 3 mg (08/12/2017), 3 mg (08/26/2017)  for chemotherapy treatment.    09/17/2017 -  Chemotherapy   The patient had dexamethasone (DECADRON) tablet 20 mg, 20 mg (100 % of original dose 20 mg), Oral,  Once, 9 of 10 cycles Dose modification: 20 mg (original dose 20 mg, Cycle 17) Administration: 20 mg (01/17/2019), 20 mg (02/16/2019), 20 mg (03/16/2019), 20 mg (06/23/2019), 20 mg (07/21/2019), 20 mg (08/18/2019), 20 mg (09/15/2019), 20 mg (10/13/2019), 20 mg (11/24/2019) daratumumab-hyaluronidase-fihj (DARZALEX FASPRO) 1800-30000 MG-UT/15ML  chemo SQ injection 1,800 mg, 1,800 mg, Subcutaneous,  Once, 12 of 13 cycles Administration: 1,800 mg (10/25/2018), 1,800 mg (11/22/2018), 1,800 mg (12/20/2018), 1,800 mg (01/17/2019), 1,800 mg (02/16/2019), 1,800 mg (03/16/2019), 1,800 mg (06/23/2019), 1,800 mg (07/21/2019), 1,800 mg (08/18/2019), 1,800 mg (09/15/2019), 1,800 mg (10/13/2019), 1,800 mg (11/24/2019) daratumumab (DARZALEX) 1,800 mg in sodium chloride 0.9 % 910 mL (1.8 mg/mL) chemo infusion, 15.9 mg/kg = 1,820 mg, Intravenous, Once, 1 of 1 cycle Administration: 1,800 mg (09/17/2017) daratumumab (DARZALEX) 1,800 mg in sodium chloride 0.9 % 410 mL (3.6 mg/mL) chemo infusion, 1,820 mg, Intravenous, Once, 13 of 13 cycles Administration: 1,800 mg (10/28/2017), 1,800 mg (11/04/2017), 1,800 mg (11/11/2017), 1,800 mg (11/18/2017), 1,800 mg (11/25/2017), 1,800 mg (12/02/2017), 1,800 mg (12/09/2017), 1,800 mg (12/16/2017), 1,800 mg (12/30/2017), 1,800 mg (04/26/2018), 1,800 mg (01/13/2018), 1,800 mg (02/12/2018), 1,800 mg (02/26/2018), 1,800 mg (03/15/2018), 1,800 mg (03/29/2018), 1,800 mg (04/12/2018), 1,800 mg (05/10/2018), 1,800 mg (06/07/2018), 1,800 mg (07/05/2018), 1,800 mg (08/02/2018), 1,800 mg (08/30/2018), 1,800 mg (09/27/2018)  for chemotherapy treatment.      CANCER STAGING: Cancer Staging No matching staging information was found for the patient.  INTERVAL HISTORY:  Mr. JaWess Baney a [age].o. male, returns for routine follow-up and consideration for next cycle of immunotherapy. JaJamarquisas last seen on 11/24/2019.  Due for cycle #26 of Darzalex Faspro today.   Overall, he tells me he has been feeling poorly. He continues feeling weak and reports that he fell 2 weeks ago in his bathroom  after tripping. Since then his right knee and ankle have been hurting. He is willing to start taking Revlimid again.  Overall, he feels ready for next cycle of immunotherapy today. He will think about whether or not he want to continue getting treatment or stop and go on  hospice. He is taking care of his wife who has dementia.   REVIEW OF SYSTEMS:  Review of Systems  Constitutional: Positive for appetite change (75%) and fatigue (75%).  Gastrointestinal: Positive for constipation.  Musculoskeletal: Positive for arthralgias (R knee and ankle since fall).  All other systems reviewed and are negative.   PAST MEDICAL/SURGICAL HISTORY:  Past Medical History:  Diagnosis Date  . BPH (benign prostatic hyperplasia)   . Colonic diverticular abscess 01/08/2015  . Diverticulitis 29/51/88   complicated by abscess and required percutaneous drainage  . GERD (gastroesophageal reflux disease)   . H/O ETOH abuse   . History of chemotherapy last done jan 2017  . History of radiation therapy 01/05/13-02/10/13   45 gray to left occipital condyle region  . History of radiation therapy 12/01/16-12/10/16   Parasternal nodule, chest- 24 Gy total delivered in 8 fractions, Left sacro-iliac, pelvis- 24 Gy total delivered in 8 fractions   . History of radiation therapy 02/19/17-02/22/17   right temporal scalp 30 Gy in 10 fractions  . Intra-abdominal abscess ([place])   . Multiple myeloma ([place]) 2015  . Neuropathy   . Radiation 02/21/14-03/08/14   right posterior chest wall area 30 gray  . Radiation 04/19/14-05/02/14   lumbar spine 25 gray  . Skull lesion    Left occipital condyle  . Wrist fracture, left    x 2   Past Surgical History:  Procedure Laterality Date  . COLONOSCOPY N/A 04/19/2015   Procedure: COLONOSCOPY;  Surgeon: Danie Binder, MD;  Location: [facility];  Service: Endoscopy;  Laterality: N/A;  1:30 PM  . CYST EXCISION  1959   tail bone  . IR IMAGING GUIDED PORT INSERTION  09/25/2017    SOCIAL HISTORY:  Social History   Socioeconomic History  . Marital status: Married    Spouse name: Not on file  . Number of children: 3  . Years of education: Not on file  . Highest education level: Not on file  Occupational History    Employer: RETIRED  Tobacco Use  .  Smoking status: Never Smoker  . Smokeless tobacco: Never Used  Vaping Use  . Vaping Use: Never used  Substance and Sexual Activity  . Alcohol use: No    Comment: " Not much no more"  . Drug use: No  . Sexual activity: Not on file  Other Topics Concern  . Not on file  Social History Narrative  . Not on file   Social Determinants of Health   Financial Resource Strain: Low Risk   . Difficulty of Paying Living Expenses: Not very hard  Food Insecurity: No Food Insecurity  . Worried About Charity fundraiser in the Last Year: Never true  . Ran Out of Food in the Last Year: Never true  Transportation Needs: No Transportation Needs  . Lack of Transportation (Medical): No  . Lack of Transportation (Non-Medical): No  Physical Activity: Inactive  . Days of Exercise per Week: 0 days  . Minutes of Exercise per Session: 0 min  Stress: No Stress Concern Present  . Feeling of Stress : Not at all  Social Connections: Moderately Isolated  . Frequency of Communication with Friends and Family: More  than three times a week  . Frequency of Social Gatherings with Friends and Family: Once a week  . Attends Religious Services: Never  . Active Member of Clubs or Organizations: No  . Attends Archivist Meetings: Never  . Marital Status: Married  Human resources officer Violence: Not At Risk  . Fear of Current or Ex-Partner: No  . Emotionally Abused: No  . Physically Abused: No  . Sexually Abused: No    FAMILY HISTORY:  Family History  Problem Relation Age of Onset  . Diabetes Mother   . Stroke Mother   . Hypertension Father     CURRENT MEDICATIONS:  Current Outpatient Medications  Medication Sig Dispense Refill  . acyclovir (ZOVIRAX) 400 MG tablet Take 1 tablet (400 mg total) by mouth 2 (two) times daily. 60 tablet 3  . ALPRAZolam (XANAX) 1 MG tablet TAKE 1 TABLET BY MOUTH 3 TIMES DAILY AS NEEDED FOR ANXIETY 60 tablet 3  . CALCIUM-VITAMIN D PO Take 1 tablet by mouth 2 (two) times  daily.     Marland Kitchen dexamethasone (DECADRON) 4 MG tablet Take 2.5 tablets (10 mg total) by mouth once a week. Take 10 mg once weekly 10 tablet 12  . diazepam (VALIUM) 10 MG tablet Take 1 tablet (10 mg total) by mouth at bedtime as needed for sleep. 30 tablet 0  . diclofenac Sodium (VOLTAREN) 1 % GEL Apply topically as needed. Pt has not picked up yet    . fluticasone (FLONASE) 50 MCG/ACT nasal spray Place 2 sprays into both nostrils daily. 16 g 6  . furosemide (LASIX) 20 MG tablet Take 1 tablet (20 mg total) by mouth daily as needed. 30 tablet 2  . gabapentin (NEURONTIN) 400 MG capsule TAKE 1 CAPSULE BY MOUTH 3 TIMES DAILY 90 capsule 4  . Homeopathic Products ([place]) Apply topically as needed.    Marland Kitchen lenalidomide (REVLIMID) 10 MG capsule Take 1 capsule (10 mg total) by mouth daily. [employer] Auth # [id number]   Date Obtained 11/24/2019 21 capsule 0  . lidocaine-prilocaine (EMLA) cream Apply small amount over port one (1) hour prior to appointment. 30 g 0  . MAGNESIUM PO Take 1 tablet by mouth at bedtime.    . montelukast (SINGULAIR) 10 MG tablet Take 1 tablet (10 mg total) by mouth at bedtime. 30 tablet 3  . omeprazole (PRILOSEC) 20 MG capsule TAKE 1 CAPSULE BY MOUTH ONCE DAILY 30 capsule 5  . Oxycodone HCl 10 MG TABS Take 1 tablet (10 mg total) by mouth every 8 (eight) hours as needed. for pain 120 tablet 0  . senna-docusate (SENOKOT-S) 8.6-50 MG tablet Take 1 tablet by mouth 2 (two) times daily. 60 tablet 5  . sulfamethoxazole-trimethoprim (BACTRIM DS) 800-160 MG tablet Take 1 tablet by mouth 2 (two) times daily. 42 tablet 0  . Suvorexant (BELSOMRA) 10 MG TABS Take 10 mg by mouth at bedtime as needed. 30 tablet 3  . tamsulosin (FLOMAX) 0.4 MG CAPS capsule TAKE ONE CAPSULE BY MOUTH EVERY NIGHT ATBEDTIME 30 capsule 11  . tiZANidine (ZANAFLEX) 4 MG tablet Take 1 tablet (4 mg total) by mouth every 6 (six) hours as needed (headache). 30 tablet 0  . traZODone (DESYREL) 100 MG tablet TAKE 1 TABLET (  100MG TOTAL ) BY MOUTH AT BEDTIME . MAY  TITRATE UP TO 300MG NIGHTLY 60 tablet 3  . zolendronic acid (ZOMETA) 4 MG/5ML injection Inject 4 mg into the vein every 30 (thirty) days.      No  current facility-administered medications for this visit.    ALLERGIES:  Allergies  Allergen Reactions  . Codeine Other (See Comments)    Headache  . Morphine And Related Other (See Comments)    Makes him feel weird  . Revlimid [Lenalidomide] Other (See Comments)    "Causes me to become weak"    PHYSICAL EXAM:  Performance status (ECOG): 1 - Symptomatic but completely ambulatory  Vitals:   12/22/19 0903  BP: (!) 122/54  Pulse: 76  Resp: 18  Temp: (!) 97 F (36.1 C)   Wt Readings from Last 3 Encounters:  12/22/19 185 lb 6.4 oz (84.1 kg)  11/24/19 186 lb 3.2 oz (84.5 kg)  10/13/19 192 lb (87.1 kg)   Physical Exam Vitals reviewed.  Constitutional:      Appearance: Normal appearance.  Cardiovascular:     Rate and Rhythm: Normal rate and regular rhythm.     Pulses: Normal pulses.     Heart sounds: Normal heart sounds.  Pulmonary:     Effort: Pulmonary effort is normal.     Breath sounds: Normal breath sounds.  Musculoskeletal:     Right lower leg: No edema.     Left lower leg: No edema.  Neurological:     General: No focal deficit present.     Mental Status: He is alert and oriented to person, place, and time.  Psychiatric:        Mood and Affect: Mood normal.        Behavior: Behavior normal.     LABORATORY DATA:  I have reviewed the labs as listed.  CBC Latest Ref Rng & Units 12/22/2019 11/24/2019 10/13/2019  WBC 4.0 - 10.5 K/uL 3.8(L) 5.2 5.1  Hemoglobin 13.0 - 17.0 g/dL 13.0 13.6 12.8(L)  Hematocrit 39 - 52 % 39.9 40.6 40.2  Platelets 150 - 400 K/uL 124(L) 105(L) 159   CMP Latest Ref Rng & Units 12/22/2019 11/24/2019 10/13/2019  Glucose 70 - 99 mg/dL 121(H) 89 90  BUN 8 - 23 mg/dL 23 17 24(H)  Creatinine 0.61 - 1.24 mg/dL 0.84 0.66 0.61  Sodium 135 - 145 mmol/L 139 141  140  Potassium 3.5 - 5.1 mmol/L 3.9 3.8 4.3  Chloride 98 - 111 mmol/L 104 104 105  CO2 22 - 32 mmol/L _0 Calcium 8.9 - 10.3 mg/dL 9.0 8.5(L) 8.8(L)  Total Protein 6.5 - 8.1 g/dL 5.1(L) 5.2(L) 5.2(L)  Total Bilirubin 0.3 - 1.2 mg/dL 0.7 0.7 0.5  Alkaline Phos 38 - 126 U/L 18(L) 18(L) 20(L)  AST 15 - 41 U/L 14(L) 13(L) 12(L)  ALT 0 - 44 U/L _1 Lab Results  Component Value Date   LDH 113 12/22/2019   LDH 124 10/13/2019   LDH 117 08/18/2019   Lab Results  Component Value Date   TOTALPROTELP 5.2 (L) 10/13/2019   ALBUMINELP 3.1 10/13/2019   A1GS 0.2 10/13/2019   A2GS 0.7 10/13/2019   BETS 0.7 10/13/2019   BETA2SER 5.3 04/07/2014   GAMS 0.5 10/13/2019   MSPIKE 0.3 (H) 10/13/2019   SPEI Comment 10/13/2019    Lab Results  Component Value Date   KPAFRELGTCHN 22.6 (H) 10/13/2019   LAMBDASER 4.7 (L) 10/13/2019   KAPLAMBRATIO 4.81 (H) 10/13/2019    DIAGNOSTIC IMAGING:  I have independently reviewed the scans and discussed with the patient. No results found.   ASSESSMENT:  1.Recurrent IgG kappa multiple myeloma: -RVD from 04/07/2014 through 07/28/2014 followed by maintenance Revlimid, which was discontinued. -MRI of  the brain on 02/09/2017 showed right temporal lesion, status post radiation, PET/CT on January 2019 showed improvement in multiple myeloma with no new lesions. -Velcade changed from every other week on 05/06/2017 secondary to neuropathy. -MRI of the thoracic and lumbar spine on 09/03/2017 showed new rib lesion close to the spine on the left side, possibly eighth rib.  There was soft tissue mass in that area.  There is chronic L4 compression fracture which is stable. -Status post XRT to the left eighth rib lesion, 30 Gray in 10 fractions from 827 2019-9 12/2017 -DPD regimen started on 09/17/2017, developed erythematous maculopapular rash involving the trunk after taking 3 pills of pomalidomide. -Daratumumab restarted after radiation on 10/28/2017, Pomalyst  completely discontinued on 02/12/2018 for recurrent rash. -Subcu daratumumab on 10/25/2018.  He takes dexamethasone 10 mg on days of treatment. -On monthly daratumumab and dexamethasone. -Revlimid 10 mg 3 weeks on 1 week off was supposed to be started on 11/24/2019.  He will start when he receives the shipment.   PLAN:  1. IgG kappa myeloma: -His most recent myeloma panel reviewed by me shows slight worsening. -He is currently on daratumumab. -At last visit I have recommended adding Revlimid at lower dose of 10 mg 3 weeks on 1 week off. -However he got confused Revlimid caused his rash, when in fact it was Pomalyst that caused rash.  He was initially treated with Revlimid in 2016 without any problems per EMR. -I had a prolonged discussion with him about continuation of therapy.  He reportedly fell 2 weeks ago when he tripped on the floor while going to the bathroom.  His wife has advanced dementia. -He has 2 daughters who live close by and help him. -He would like to discuss with his daughters about continuation of treatments.  In the meanwhile he wants to try Revlimid 10 mg daily 3 weeks on 1 week off. -We will need to discuss further plan in 4 weeks based on tolerability of Revlimid and the patient's decision to continue treatment. -Today he will proceed with Darzalex.  I have reviewed his labs which were grossly within normal limits.  2. Low back pain: -Continue oxycodone as needed.  He takes up to 3 pills/day.  3. Peripheral neuropathy: -Continue gabapentin 3 times daily.  4. Bone protection: -Continue Zometa every 8 weeks.  Calcium is normal.  5. Sleeping difficulty: -Continue Valium 10 mg at bedtime as needed.  6. Hypokalemia: -Continue home potassium supplements.  Potassium is normal today.   Orders placed this encounter:  No orders of the defined types were placed in this encounter.    Derek Jack, MD [place] [phone]   I,  Milinda Antis, am acting as a scribe for Dr. Sanda Linger.  I, Derek Jack MD, have reviewed the above documentation for accuracy and completeness, and I agree with the above.

## 2019-12-22 NOTE — Patient Instructions (Signed)
[place] at [facility] Discharge Instructions  You were seen today by Dr. Delton Coombes. He went over your recent results. You received your treatment today. You will be started on Revlimid to take daily or every other day for 3 weeks on with 1 week off. Dr. Delton Coombes will see you back in 4 weeks for labs and follow up.   Thank you for choosing [facility] at [facility] to provide your oncology and hematology care.  To afford each patient quality time with our provider, please arrive at least 15 minutes before your scheduled appointment time.   If you have a lab appointment with the [facility] please come in thru the Main Entrance and check in at the main information desk  You need to re-schedule your appointment should you arrive 10 or more minutes late.  We strive to give you quality time with our providers, and arriving late affects you and other patients whose appointments are after yours.  Also, if you no show three or more times for appointments you may be dismissed from the clinic at the providers discretion.     Again, thank you for choosing [facility].  Our hope is that these requests will decrease the amount of time that you wait before being seen by our physicians.       _____________________________________________________________  Should you have questions after your visit to [facility], please contact our office at [phone] between the hours of 8:00 a.m. and 4:30 p.m.  Voicemails left after 4:00 p.m. will not be returned until the following business day.  For prescription refill requests, have your pharmacy contact our office and allow 72 hours.    Cancer Center Support Programs:   > Cancer Support Group  2nd Tuesday of the month 1pm-2pm, Journey Room

## 2019-12-22 NOTE — Progress Notes (Signed)
Message received from [place] RN/ Dr. Delton Coombes to proceed with treatment. Labs reviewed by MD.

## 2019-12-22 NOTE — Progress Notes (Signed)
Patient was assessed by Dr. Katragadda and labs have been reviewed.  Patient is okay to proceed with treatment today. Primary RN and pharmacy aware.   

## 2019-12-22 NOTE — Progress Notes (Signed)
Patient presents today for treatment.  Vital signs stable.  Labs reviewed and within parameters for treatment.  No new complaints.  Received message patient okay for treatment.  Treatment given today per MD orders.  Tolerated infusion without adverse affects.  Vital signs stable.  No complaints at this time.  Discharge from clinic via wheelchair in stable condition.  Alert and oriented X 3.  Follow up with [facility] as scheduled.

## 2019-12-23 LAB — PROTEIN ELECTROPHORESIS, SERUM
A/G Ratio: 1.7 (ref 0.7–1.7)
Albumin ELP: 3 g/dL (ref 2.9–4.4)
Alpha-1-Globulin: 0.2 g/dL (ref 0.0–0.4)
Alpha-2-Globulin: 0.6 g/dL (ref 0.4–1.0)
Beta Globulin: 0.7 g/dL (ref 0.7–1.3)
Gamma Globulin: 0.4 g/dL (ref 0.4–1.8)
Globulin, Total: 1.8 g/dL — ABNORMAL LOW (ref 2.2–3.9)
M-Spike, %: 0.1 g/dL — ABNORMAL HIGH
Total Protein ELP: 4.8 g/dL — ABNORMAL LOW (ref 6.0–8.5)

## 2019-12-23 LAB — KAPPA/LAMBDA LIGHT CHAINS
Kappa free light chain: 21 mg/L — ABNORMAL HIGH (ref 3.3–19.4)
Kappa, lambda light chain ratio: 3 — ABNORMAL HIGH (ref 0.26–1.65)
Lambda free light chains: 7 mg/L (ref 5.7–26.3)

## 2019-12-26 ENCOUNTER — Other Ambulatory Visit (HOSPITAL_COMMUNITY): Payer: Self-pay

## 2019-12-26 MED ORDER — LENALIDOMIDE 10 MG PO CAPS
10.0000 mg | ORAL_CAPSULE | Freq: Every day | ORAL | 0 refills | Status: DC
Start: 2019-12-26 — End: 2020-05-23

## 2019-12-26 NOTE — Telephone Encounter (Signed)
New prescription for Revlimid faxed to [facility] per Dr. Delton Coombes

## 2019-12-30 IMAGING — XA IR FLUORO GUIDE CV LINE*L*
1 series · 1 of 1 positions shown · non-contrast
Comparison: none

CLINICAL DATA: Multiple myeloma

[Series 300: dsa body · 1 of 1 slices shown]
[im 1/1]
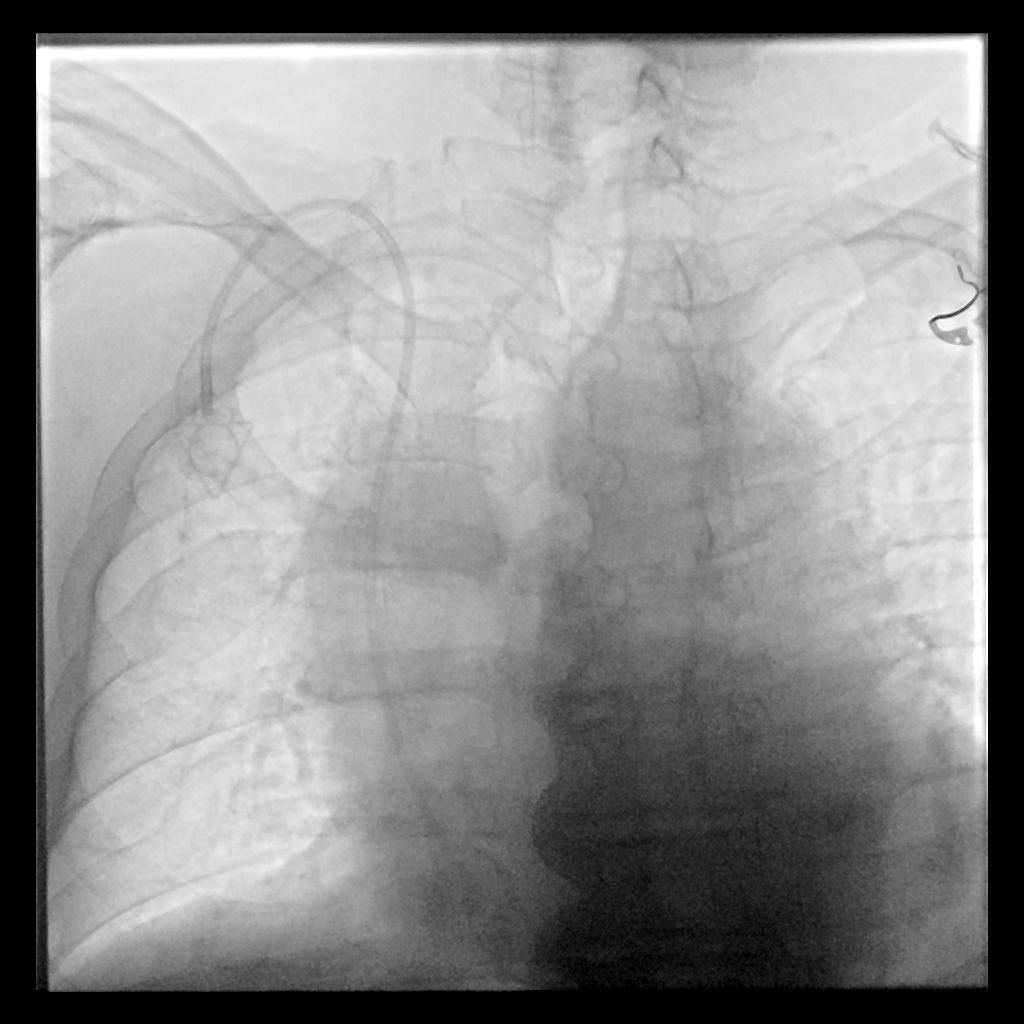

[1 of 1 positions shown; findings below may reference images not displayed]

EXAM:
TUNNEL POWER PORT PLACEMENT WITH SUBCUTANEOUS POCKET UTILIZING
ULTRASOUND & FLOUROSCOPY

FLUOROSCOPY TIME:  36 seconds.  Five mGy.

MEDICATIONS AND MEDICAL HISTORY:
Versed 2 mg, Fentanyl 100 mcg.

Additional Medications: Ancef 2 g. Antibiotics were given within 2
hours of the procedure.

ANESTHESIA/SEDATION:
Moderate sedation time: 25 minutes. Nursing monitored the the
patient during the procedure.

PROCEDURE:
After written informed consent was obtained, patient was placed in
the supine position on angiographic table. The right neck and chest
was prepped and draped in a sterile fashion. Lidocaine was utilized
for local anesthesia. The right jugular vein was noted to be patent
initially with ultrasound. Under sonographic guidance, a
micropuncture needle was inserted into the right IJ vein (Ultrasound
and fluoroscopic image documentation was performed). The needle was
removed over an 018 wire which was exchanged for a Amplatz. This was
advanced into the IVC. An 8-French dilator was advanced over the
Amplatz.

A small incision was made in the right upper chest over the anterior
right second rib. Utilizing blunt dissection, a subcutaneous pocket
was created in the caudal direction. The pocket was irrigated with a
copious amount of sterile normal saline. The port catheter was
tunneled from the chest incision, and out the neck incision. The
reservoir was inserted into the subcutaneous pocket and secured with
two 3-0 Ethilon stitches. A peel-away sheath was advanced over the
Amplatz wire. The port catheter was cut to measure length and
inserted through the peel-away sheath. The peel-away sheath was
removed. The chest incision was closed with 3-0 Vicryl interrupted
stitches for the subcutaneous tissue and a running of 4-0 Vicryl
subcuticular stitch for the skin. The neck incision was closed with
a 4-0 Vicryl subcuticular stitch. Derma-bond was applied to both
surgical incisions. The port reservoir was flushed and instilled
with heparinized saline. No complications.
FINDINGS: A right IJ vein Port-A-Cath is in place with its tip at the
cavoatrial junction.

COMPLICATIONS:
None
IMPRESSION: Successful 8 French right internal jugular vein power port placement
with its tip at the SVC/RA junction.

## 2020-01-05 ENCOUNTER — Telehealth: Payer: Self-pay | Admitting: *Deleted

## 2020-01-05 NOTE — Telephone Encounter (Signed)
Returned patient's phone call, lvm for a return call 

## 2020-01-09 ENCOUNTER — Ambulatory Visit
Admission: RE | Admit: 2020-01-09 | Discharge: 2020-01-09 | Disposition: A | Discharge status: Home / Self Care | Payer: Medicare Other | Source: Ambulatory Visit | Attending: Radiation Oncology | Admitting: Radiation Oncology

## 2020-01-09 ENCOUNTER — Other Ambulatory Visit: Payer: Self-pay

## 2020-01-09 ENCOUNTER — Telehealth: Payer: Self-pay | Admitting: *Deleted

## 2020-01-09 ENCOUNTER — Encounter: Payer: Self-pay | Admitting: Radiation Oncology

## 2020-01-09 VITALS — BP 133/70 | HR 70 | Temp 98.2°F | Resp 18 | Ht 68.0 in | Wt 186.1 lb

## 2020-01-09 DIAGNOSIS — Z79899 Other long term (current) drug therapy: Secondary | ICD-10-CM | POA: Diagnosis not present

## 2020-01-09 DIAGNOSIS — C9000 Multiple myeloma not having achieved remission: Secondary | ICD-10-CM | POA: Diagnosis not present

## 2020-01-09 DIAGNOSIS — Z923 Personal history of irradiation: Secondary | ICD-10-CM | POA: Diagnosis not present

## 2020-01-09 DIAGNOSIS — R21 Rash and other nonspecific skin eruption: Secondary | ICD-10-CM | POA: Diagnosis not present

## 2020-01-09 DIAGNOSIS — G893 Neoplasm related pain (acute) (chronic): Secondary | ICD-10-CM | POA: Insufficient documentation

## 2020-01-09 NOTE — Progress Notes (Signed)
BP 133/70 (BP Location: Left Arm, Patient Position: Sitting)   Pulse 70   Temp 98.2 F (36.8 C) (Temporal)   Resp 18   Ht 5\' 8"  (1.727 m)   Wt 186 lb 2 oz (84.4 kg)   SpO2 97%   BMI 28.30 kg/m  Filed Weights   01/09/20 0806  Weight: 186 lb 2 oz (84.4 kg)    Patient in for reconsult for multimyeloma. Is not satisfied with chemo treatment placed on Revelimid and now has a rash all over. Was asked if he wanted to stop all treatment but patient is not ready for this course as of this time. Is complaining of drooling from previous radiation also. Just wants to know if something else can be done. Has pain in back in the morning also in his legs. He has fallen several times once he was on the floor for several hours.

## 2020-01-09 NOTE — Progress Notes (Signed)
Radiation Oncology         [phone]) [phone] ________________________________  Name: Dean [name]. MRN: [id number]  Date: 01/09/2020  DOB: [birth year]  Re-Consultation Note  CC: [name], MD  Derek Jack, MD    ICD-10-CM   1. Multiple myeloma without remission [facility])  C90.00 MR Pelvis W Wo Contrast    MR Lumbar Spine W Wo Contrast    Diagnosis: Recurrent Multiple Myeloma  Narrative: The patient returns for re-consultation. He was last seen in follow-up on 11/16/2017, during which time he was recovering from the effects of radiation and was clinically stable. He was to continue close follow up with medical oncology and follow up with radiation oncology as needed.  In the interval, the patient has undergone the following imaging studies: 1. MRI of lumbar spine on 01/11/2018 that showed a 15 mm bone lesion in the left sacrum that was most consistent with a focus of multiple myeloma given the patient's history. There was a broad-based disc bulge at L3-4 with a left paracentral disc extrusion with inferior migration of disc material. Additionally, there was severe bilateral foraminal stenosis and diffuse lumbar spondylosis. 2. MRI of brain on 09/06/2018 that showed interval resolution of the right temporalis mass. There was noted to be mild chronic small vessel ischemic disease. There was no new skull lesion or acute intracranial abnormality identified. 3. Metastatic bone survey on 11/30/2018 showed new expansion of the right 1st and left 8th ribs posteriorly, suspicious for progressive myeloma. Chronic posttraumatic deformities of the right 2nd rib and L4 vertebral body were stable. No acute fractures. Additionally, there was noted to be diffuse spondylosis and a possible small right renal calculus. 4. MRI of lumbar spine on 04/27/2019 showed a new vertical fractures of the left sacral ala, which was likely a stress or posttraumatic fractures. He also had a small chronic myeloma  lesion at S2 on the left, chronic moderately severe spinal stenosis at L3-4 with marked impingement upon the left lateral recess (could affect the left L4 nerve), chronic compression fractures of L2 and L4 that were stable, chronic soft disc extrusion to the left at L3-4 extending inferiorly behind the body of L4 on the left, and slight progression of myomatous disease on the left side of the S1 segment of the sacrum. 5. MRI of pelvis on 04/27/2019 that showed a vertical fracture through the left sacral ala, consistent with stress fracture or posttraumatic fracture. There was also a subtle transverse fracture through the S3 and S4 segments of the sacrum. 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 6. MRI of thoracic spine on 04/27/2019 that showed a chronic myeloma lesion of the right first rib that was unchanged and an improved myeloma lesion of the left seventh rib. There was no significant abnormality of the thoracic spine and there was no evidence of metastatic disease of the thoracic spine.  The patient was last seen by Dr. Delton Coombes on 12/22/2019. He has undergone 26 cycles of Darzalex Faspro. At that time, it was recommended that Revlimid be added.  On review of systems, the patient reports worsening pain in the lower back and pelvis region. He denies weakness in his lower extremities.  He ambulates with the assistance of a cane.  Reports developing a rash after restarting Revlimid.  He has had some itching but no breathing problems.  He started  Benadryl yesterday for this rash   Radiation Treatment Dates: 1) 09/29/17 - 10/14/17 2) 02/19/17 - 02/22/17 3) 12/01/16 - 12/10/16 4) 04/19/2014 - 05/02/2014  5) 02/21/2014 - 03/08/2014 6) 01/05/2013 - 02/10/2013  Site/dose: 1) Ribs, Left / 30 Gy delivered in 10 fractions of 3  Gy 2) Right temporalScalp / 30 Gy in 10 fractions 3) 1. Parasternal nodule, chest- 24 Gy total delivered in50factions      2. Left sacro-iliac, pelvis- 24 Gy total delivered in 8 fractions 4) Lumbar spine and 25 gray in 10 fractions 5) Right posterior chest wall area, 30 gray in 10 fractions 6) Right occipital condyle region to 45 Gy in 25 fractions  Allergies:  is allergic to codeine, morphine and related, and revlimid [lenalidomide].  Meds: Current Outpatient Medications  Medication Sig Dispense Refill  . acyclovir (ZOVIRAX) 400 MG tablet Take 1 tablet (400 mg total) by mouth 2 (two) times daily. 60 tablet 3  . ALPRAZolam (XANAX) 1 MG tablet TAKE 1 TABLET BY MOUTH 3 TIMES DAILY AS NEEDED FOR ANXIETY 60 tablet 3  . CALCIUM-VITAMIN D PO Take 1 tablet by mouth 2 (two) times daily.     . fluticasone (FLONASE) 50 MCG/ACT nasal spray Place 2 sprays into both nostrils daily. 16 g 6  . furosemide (LASIX) 20 MG tablet Take 1 tablet (20 mg total) by mouth daily as needed. 30 tablet 2  . gabapentin (NEURONTIN) 400 MG capsule TAKE 1 CAPSULE BY MOUTH 3 TIMES DAILY 90 capsule 4  . Homeopathic Products ([place] Apply topically as needed.    .Marland Kitchenlenalidomide (REVLIMID) 10 MG capsule Take 1 capsule (10 mg total) by mouth daily. [employer] Auth # [id number] Obtained 12/26/2019 21 capsule 0  . lidocaine-prilocaine (EMLA) cream Apply small amount over port one (1) hour prior to appointment. 30 g 0  . montelukast (SINGULAIR) 10 MG tablet Take 1 tablet (10 mg total) by mouth at bedtime. 30 tablet 3  . omeprazole (PRILOSEC) 20 MG capsule TAKE 1 CAPSULE BY MOUTH ONCE DAILY 30 capsule 5  . Oxycodone HCl 10 MG TABS Take 1 tablet (10 mg total) by mouth every 8 (eight) hours as needed. for pain 120 tablet 0  . senna-docusate (SENOKOT-S) 8.6-50 MG tablet Take 1 tablet by mouth 2 (two) times daily. 60 tablet 5  . tamsulosin (FLOMAX) 0.4 MG CAPS capsule TAKE ONE CAPSULE BY MOUTH EVERY NIGHT ATBEDTIME 30  capsule 11  . traZODone (DESYREL) 100 MG tablet TAKE 1 TABLET ( 100MG TOTAL ) BY MOUTH AT BEDTIME. 60 tablet 3  . zolendronic acid (ZOMETA) 4 MG/5ML injection Inject 4 mg into the vein every 30 (thirty) days.      No current facility-administered medications for this encounter.    Physical Findings: The patient is in no acute distress. Patient is alert and oriented.  height is 5' 8"  (1.727 m) and weight is 186 lb 2 oz (84.4 kg). His temporal temperature is 98.2 F (36.8 C). His blood pressure is 133/70 and his pulse is 70. His respiration is 18 and oxygen saturation is 97%.  Lungs are clear to auscultation bilaterally. Heart has regular rate and rhythm. No palpable cervical, supraclavicular, or axillary adenopathy. Abdomen soft, non-tender, normal bowel sounds.  Patient has generalized weakness but is able to get up on the examination table.  No focal deficits noted in the lower extremities.  Skin rash as documented below  Media Information    Media Information    Media Information  Lab Findings: Lab Results  Component Value Date   WBC 3.8 (L) 12/22/2019   HGB 13.0 12/22/2019   HCT 39.9 12/22/2019   MCV 94.5 12/22/2019   PLT 124 (L) 12/22/2019    Radiographic Findings: No results found.  Impression: Recurrent Multiple Myeloma  Patient is complaining of worsening pain in his lower back and pelvis region.  This could be related to progressive myeloma.  He does have a lot of degenerative changes in the spine which could be accounting for some of his pain.  We discussed repeating his MRIs to see if there have been significant changes warranting additional radiation therapy.  The patient wishes to proceed with imaging  Plan: Patient has been scheduled for an MRI of the lumbar spine and pelvis region.  This will be performed at the [facility] this weekend final treatment plans pending results of this MRI studies.  I recommended the discussed this rash with  medical oncology.  The patient reports he will be stopping Revlimid until discussing with medical oncology..  We will  contact the covering medical oncologist concerning the patient's rash.   Total time spent in this encounter was 35 minutes which included reviewing the patient's most recent MRIs, chemotherapy, follow-ups with Dr. Delton Coombes, physical examination, and documentation.  -----------------------------------  Blair Promise, PhD, MD  This document serves as a record of services personally performed by Gery Pray, MD. It was created on his behalf by Clerance Lav, a trained medical scribe. The creation of this record is based on the scribe's personal observations and the provider's statements to them. This document has been checked and approved by the attending provider.

## 2020-01-09 NOTE — Telephone Encounter (Signed)
CALLED PATIENT TO INFORM OF MRI'S FOR 01-15-20 - ARRIVAL TIME- 7:30 AM @ [facility], NO RESTRICTIONS TO TEST, PATIENT TO REPORT TO MEDICAL MALL ENTRANCE, SPOKE WITH PATIENT AND HE IS AWARE OF THESE TESTS

## 2020-01-12 ENCOUNTER — Encounter (HOSPITAL_COMMUNITY): Payer: Self-pay

## 2020-01-12 ENCOUNTER — Other Ambulatory Visit (HOSPITAL_COMMUNITY): Payer: Self-pay

## 2020-01-12 MED ORDER — MONTELUKAST SODIUM 10 MG PO TABS
10.0000 mg | ORAL_TABLET | Freq: Every day | ORAL | 3 refills | Status: DC
Start: 2020-01-12 — End: 2020-04-27

## 2020-01-12 NOTE — Progress Notes (Signed)
Note received from Dr. Sondra Come regarding patient's rash. I called and discussed this with the patient who reports that the rash developed within 4 days of taking Revlimid. Patient reports taking his last dose of Revlimid on Saturday, December 01/07/20 and has had significant improvement in the rash since stopping the Revlimid. Patient instructed to remain off of the Revlimid and to come for his scheduled appointment on 01/19/20.

## 2020-01-15 ENCOUNTER — Other Ambulatory Visit: Payer: Self-pay

## 2020-01-15 ENCOUNTER — Ambulatory Visit
Admission: RE | Admit: 2020-01-15 | Discharge: 2020-01-15 | Disposition: A | Discharge status: Home / Self Care | Payer: Medicare Other | Source: Ambulatory Visit | Attending: Radiation Oncology | Admitting: Radiation Oncology

## 2020-01-15 DIAGNOSIS — M545 Low back pain, unspecified: Secondary | ICD-10-CM | POA: Diagnosis not present

## 2020-01-15 DIAGNOSIS — C9000 Multiple myeloma not having achieved remission: Secondary | ICD-10-CM

## 2020-01-15 DIAGNOSIS — S3210XA Unspecified fracture of sacrum, initial encounter for closed fracture: Secondary | ICD-10-CM | POA: Diagnosis not present

## 2020-01-15 MED ORDER — GADOBUTROL 1 MMOL/ML IV SOLN
7.5000 mL | Freq: Once | INTRAVENOUS | Status: AC | PRN
  Administered 2020-01-15: 09:00:00 7.5 mL via INTRAVENOUS

## 2020-01-18 ENCOUNTER — Other Ambulatory Visit (HOSPITAL_COMMUNITY): Payer: Medicare Other

## 2020-01-18 NOTE — Progress Notes (Signed)
Dean [name], Dean [name] [id number]   CLINIC:  Medical Oncology/Hematology  PCP:  [name], MD [address] [place] [address] [phone]   REASON FOR VISIT:  Follow-up for IgG kappa multiple myeloma  PRIOR THERAPY: Velcade x 6 cycles from 04/07/2014 to 07/28/2014; x 7 cycles from 02/18/2017 to 08/26/2017  NGS Results: Not done  CURRENT THERAPY: Darzalex Faspro every 4 weeks  BRIEF ONCOLOGIC HISTORY:  Oncology History  Multiple myeloma without remission ([place])  11/16/2012 Initial Diagnosis   L occipital condyle destructive lesion, not biopsied secondary to location. XRT   12/01/2012 Imaging   L3 superior endplate compression FX, no lytic lesions to suggest myeloma   02/06/2014 Imaging   soft tissue mass along the right posterior fifth rib with some rib destruction   02/16/2014 Miscellaneous   Normal CBC, Normal CMP, kappa,lamda ratio of 2.62 (abnormal), UIEP with slightly restricted band, IgG kappa, SPEP/IEP with monoclonal protein at 0.79 g/dl, normal igG, suppressed IGM   02/20/2014 Bone Marrow Biopsy   33% kappa restricted plasma cells Normal FISH, normal cytogenetics   02/21/2014 - 03/08/2014 Radiation Therapy   30Gy to R rib lesion, lesion not biopsied   03/16/2014 PET scan   Scattered hypermetabolic osseous lesions, including the calvarium, ribs, left scapula, sacrum, and left femoral shaft. An expansile left vertebral body lesion at L3 extends into the epidural space of the left lateral recess,    03/17/2014 - 04/07/2014 Chemotherapy   Velcade and Dexamethasone due to initial denial of Revlimid by insurance company.  Zometa monthly.   03/22/2014 Imaging   MR_ L-Spine- Enhancing lesions compatible with multiple myeloma at L2, L3, and S1-2.   04/07/2014 - 07/28/2014 Chemotherapy   RVD with Revlimid at 25 mg days 1-14.  Revlimid dose reduced to 25 mg every other day x 14 days with 7 day respite beginning on day 1 of  cycle 2.   04/17/2014 Adverse Reaction   Revlimid-induced rash.  Treated with steroids.   07/28/2014 - 08/04/2014 Chemotherapy   Revlimid daily   08/04/2014 Adverse Reaction   Velcade-induced peripheral neuropathy   08/04/2014 Treatment Plan Change   D/C Velcade   08/11/2014 PET scan   Response to therapy. Improvement and resolution of foci of osseous hypermetabolism.   08/22/2014 - 08/28/2014 Chemotherapy   Revlimid 25 mg days 1-21 every 28 days   08/28/2014 Adverse Reaction   Revlimid-induced rash. Medication held.  Medrol dose Pak prescribed.   09/04/2014 - 01/08/2015 Chemotherapy   Revlimid 10 mg every other day, without dexamethasone   01/08/2015 - 01/15/2015 Hospital Admission   Colonic diverticular abscess with IR drain placement   03/02/2015 PET scan   Only bony uptake is low activity at site of deformity/callus at R second rib FX, high activity in sigmoid colon, advanced sigmoid divertidulosis. Abscess not resolved?   06/04/2015 Imaging   CT nonobstructive L renal calculus, diverticulosis of descending and sigmoid colon without inflammation, moderate prostatic enlargement   07/12/2015 Surgery   Diverticulitis s/p robotic sigmoid colectomy with Dr. Johney Maine   08/23/2015 PET scan   Primarily similar hypermetabolic osseous foci within the R sided ribs. new L third rib focus of hypermetab and sclerosis is favored to be related to healing FX. no soft tissue myeloma id'd. Presumed postop hypermetab and edema about the R pelvic wall    03/06/2016 PET scan   1. Reduced activity in the prior lesion such as the right second and  fifth rib lesions, activity nearly completely resolved and significantly less than added mediastinal blood pool activity. No new lesions are identified. 2. Other imaging findings of potential clinical significance: Coronary, aortic arch, and branch vessel atherosclerotic vascular disease. Aortoiliac atherosclerotic vascular disease. Enlarged prostate gland. Colonic  diverticula.   02/17/2017 PET scan   HEAD/NECK: No hypermetabolic activity in the scalp. No hypermetabolic cervical lymph nodes. CHEST: No hypermetabolic mediastinal or hilar nodes. Right upper lobe scarring/atelectasis. No suspicious pulmonary nodules on the CT scan. ABDOMEN/PELVIS: No abnormal hypermetabolic activity within the liver, pancreas, adrenal glands, or spleen. No hypermetabolic lymph nodes in the abdomen or pelvis. Atherosclerotic calcifications of the abdominal aorta and branch vessels. Left renal sinus cysts. Sigmoid diverticulosis, without evidence of diverticulitis. Prostatomegaly. SKELETON: Vague hypermetabolism involving the inferior sternum, max SUV 3.2, previously 8.2. Focal hypermetabolism involving the left sacrum, max SUV 4.0, previously 6.7. EXTREMITIES: No abnormal hypermetabolic activity in the lower extremities.   02/18/2017 -  Chemotherapy   Bortezomib 1.66m/m2 QWk + Dexamethasone 160mQTue/Fri --Cycle #1, 02/18/17    02/18/2017 - 08/26/2017 Chemotherapy   The patient had bortezomib SQ (VELCADE) chemo injection 3 mg, 1.3 mg/m2 = 3 mg, Subcutaneous,  Once, 7 of 9 cycles Administration: 3 mg (02/18/2017), 3 mg (02/26/2017), 3 mg (03/04/2017), 3 mg (03/11/2017), 3 mg (04/08/2017), 3 mg (03/18/2017), 3 mg (03/25/2017), 3 mg (04/01/2017), 3 mg (05/06/2017), 3 mg (05/20/2017), 3 mg (06/03/2017), 3 mg (06/17/2017), 3 mg (07/01/2017), 3 mg (07/15/2017), 3 mg (07/29/2017), 3 mg (08/12/2017), 3 mg (08/26/2017)  for chemotherapy treatment.    09/17/2017 -  Chemotherapy   The patient had dexamethasone (DECADRON) tablet 20 mg, 20 mg (100 % of original dose 20 mg), Oral,  Once, 10 of 12 cycles Dose modification: 20 mg (original dose 20 mg, Cycle 17) Administration: 20 mg (01/17/2019), 20 mg (02/16/2019), 20 mg (03/16/2019), 20 mg (06/23/2019), 20 mg (07/21/2019), 20 mg (08/18/2019), 20 mg (09/15/2019), 20 mg (10/13/2019), 20 mg (11/24/2019), 20 mg (12/22/2019) daratumumab-hyaluronidase-fihj (DARZALEX FASPRO)  1800-30000 MG-UT/15ML chemo SQ injection 1,800 mg, 1,800 mg, Subcutaneous,  Once, 13 of 15 cycles Administration: 1,800 mg (10/25/2018), 1,800 mg (11/22/2018), 1,800 mg (12/20/2018), 1,800 mg (01/17/2019), 1,800 mg (02/16/2019), 1,800 mg (03/16/2019), 1,800 mg (06/23/2019), 1,800 mg (07/21/2019), 1,800 mg (08/18/2019), 1,800 mg (09/15/2019), 1,800 mg (10/13/2019), 1,800 mg (11/24/2019), 1,800 mg (12/22/2019) daratumumab (DARZALEX) 1,800 mg in sodium chloride 0.9 % 910 mL (1.8 mg/mL) chemo infusion, 15.9 mg/kg = 1,820 mg, Intravenous, Once, 1 of 1 cycle Administration: 1,800 mg (09/17/2017) daratumumab (DARZALEX) 1,800 mg in sodium chloride 0.9 % 410 mL (3.6 mg/mL) chemo infusion, 1,820 mg, Intravenous, Once, 13 of 13 cycles Administration: 1,800 mg (10/28/2017), 1,800 mg (11/04/2017), 1,800 mg (11/11/2017), 1,800 mg (11/18/2017), 1,800 mg (11/25/2017), 1,800 mg (12/02/2017), 1,800 mg (12/09/2017), 1,800 mg (12/16/2017), 1,800 mg (12/30/2017), 1,800 mg (04/26/2018), 1,800 mg (01/13/2018), 1,800 mg (02/12/2018), 1,800 mg (02/26/2018), 1,800 mg (03/15/2018), 1,800 mg (03/29/2018), 1,800 mg (04/12/2018), 1,800 mg (05/10/2018), 1,800 mg (06/07/2018), 1,800 mg (07/05/2018), 1,800 mg (08/02/2018), 1,800 mg (08/30/2018), 1,800 mg (09/27/2018)  for chemotherapy treatment.      CANCER STAGING: Cancer Staging No matching staging information was found for the patient.  INTERVAL HISTORY:  Mr. JaIlyaas Musto a 89.o. male, returns for routine follow-up and consideration for next cycle of immunotherapy.  Due for cycle #26 of Darzalex Faspro today.   He is not taking revelimid. He says he broke out in a rash, he stopped revelimid and rash disappeared. He complains of pains all  over. He fell recently and hurt his hip, cant remember if it was the right or left. Neuropathy about the same. No other B symptoms. Feels tired all the time. Taking pain medication at least couple times a day.  REVIEW OF SYSTEMS:  Review of Systems   Constitutional: Positive for appetite change (75%) and fatigue (75%).  Gastrointestinal: Positive for constipation.  Musculoskeletal: Positive for arthralgias (R knee and ankle since fall).  Neurological: Positive for headaches.  All other systems reviewed and are negative.   PAST MEDICAL/SURGICAL HISTORY:  Past Medical History:  Diagnosis Date  . BPH (benign prostatic hyperplasia)   . Colonic diverticular abscess 01/08/2015  . Diverticulitis 01/21/74   complicated by abscess and required percutaneous drainage  . GERD (gastroesophageal reflux disease)   . H/O ETOH abuse   . History of chemotherapy last done jan 2017  . History of radiation therapy 01/05/13-02/10/13   45 gray to left occipital condyle region  . History of radiation therapy 12/01/16-12/10/16   Parasternal nodule, chest- 24 Gy total delivered in 8 fractions, Left sacro-iliac, pelvis- 24 Gy total delivered in 8 fractions   . History of radiation therapy 02/19/17-02/22/17   right temporal scalp 30 Gy in 10 fractions  . Intra-abdominal abscess ([place])   . Multiple myeloma ([place]) 2015  . Neuropathy   . Radiation 02/21/14-03/08/14   right posterior chest wall area 30 gray  . Radiation 04/19/14-05/02/14   lumbar spine 25 gray  . Skull lesion    Left occipital condyle  . Wrist fracture, left    x 2   Past Surgical History:  Procedure Laterality Date  . COLONOSCOPY N/A 04/19/2015   Procedure: COLONOSCOPY;  Surgeon: Danie Binder, MD;  Location: [facility];  Service: Endoscopy;  Laterality: N/A;  1:30 PM  . CYST EXCISION  1959   tail bone  . IR IMAGING GUIDED PORT INSERTION  09/25/2017    SOCIAL HISTORY:  Social History   Socioeconomic History  . Marital status: Married    Spouse name: Not on file  . Number of children: 3  . Years of education: Not on file  . Highest education level: Not on file  Occupational History    Employer: RETIRED  Tobacco Use  . Smoking status: Never Smoker  . Smokeless tobacco: Never  Used  Vaping Use  . Vaping Use: Never used  Substance and Sexual Activity  . Alcohol use: No    Comment: " Not much no more"  . Drug use: No  . Sexual activity: Not on file  Other Topics Concern  . Not on file  Social History Narrative  . Not on file   Social Determinants of Health   Financial Resource Strain: Low Risk   . Difficulty of Paying Living Expenses: Not very hard  Food Insecurity: No Food Insecurity  . Worried About Charity fundraiser in the Last Year: Never true  . Ran Out of Food in the Last Year: Never true  Transportation Needs: No Transportation Needs  . Lack of Transportation (Medical): No  . Lack of Transportation (Non-Medical): No  Physical Activity: Inactive  . Days of Exercise per Week: 0 days  . Minutes of Exercise per Session: 0 min  Stress: No Stress Concern Present  . Feeling of Stress : Not at all  Social Connections: Moderately Isolated  . Frequency of Communication with Friends and Family: More than three times a week  . Frequency of Social Gatherings with Friends and Family: Once a  week  . Attends Religious Services: Never  . Active Member of Clubs or Organizations: No  . Attends Archivist Meetings: Never  . Marital Status: Married  Human resources officer Violence: Not At Risk  . Fear of Current or Ex-Partner: No  . Emotionally Abused: No  . Physically Abused: No  . Sexually Abused: No    FAMILY HISTORY:  Family History  Problem Relation Age of Onset  . Diabetes Mother   . Stroke Mother   . Hypertension Father     CURRENT MEDICATIONS:  Current Outpatient Medications  Medication Sig Dispense Refill  . acyclovir (ZOVIRAX) 400 MG tablet Take 1 tablet (400 mg total) by mouth 2 (two) times daily. 60 tablet 3  . ALPRAZolam (XANAX) 1 MG tablet TAKE 1 TABLET BY MOUTH 3 TIMES DAILY AS NEEDED FOR ANXIETY 60 tablet 3  . CALCIUM-VITAMIN D PO Take 1 tablet by mouth 2 (two) times daily.     . fluticasone (FLONASE) 50 MCG/ACT nasal spray  Place 2 sprays into both nostrils daily. 16 g 6  . furosemide (LASIX) 20 MG tablet Take 1 tablet (20 mg total) by mouth daily as needed. 30 tablet 2  . gabapentin (NEURONTIN) 400 MG capsule TAKE 1 CAPSULE BY MOUTH 3 TIMES DAILY 90 capsule 4  . Homeopathic Products ([place]) Apply topically as needed.    Marland Kitchen lenalidomide (REVLIMID) 10 MG capsule Take 1 capsule (10 mg total) by mouth daily. [employer] Auth # [id number] Date Obtained 12/26/2019 21 capsule 0  . lidocaine-prilocaine (EMLA) cream Apply small amount over port one (1) hour prior to appointment. 30 g 0  . montelukast (SINGULAIR) 10 MG tablet Take 1 tablet (10 mg total) by mouth at bedtime. 30 tablet 3  . omeprazole (PRILOSEC) 20 MG capsule TAKE 1 CAPSULE BY MOUTH ONCE DAILY 30 capsule 5  . Oxycodone HCl 10 MG TABS Take 1 tablet (10 mg total) by mouth every 8 (eight) hours as needed. for pain 120 tablet 0  . senna-docusate (SENOKOT-S) 8.6-50 MG tablet Take 1 tablet by mouth 2 (two) times daily. 60 tablet 5  . tamsulosin (FLOMAX) 0.4 MG CAPS capsule TAKE ONE CAPSULE BY MOUTH EVERY NIGHT ATBEDTIME 30 capsule 11  . traZODone (DESYREL) 100 MG tablet TAKE 1 TABLET ( 100MG TOTAL ) BY MOUTH AT BEDTIME. 60 tablet 3  . zolendronic acid (ZOMETA) 4 MG/5ML injection Inject 4 mg into the vein every 30 (thirty) days.      No current facility-administered medications for this visit.    ALLERGIES:  Allergies  Allergen Reactions  . Codeine Other (See Comments)    Headache  . Morphine And Related Other (See Comments)    Makes him feel weird  . Revlimid [Lenalidomide] Other (See Comments)    "Causes me to become weak"    PHYSICAL EXAM:  Performance status (ECOG): 1 - Symptomatic but completely ambulatory  There were no vitals filed for this visit. Wt Readings from Last 3 Encounters:  01/09/20 186 lb 2 oz (84.4 kg)  12/22/19 185 lb 6.4 oz (84.1 kg)  11/24/19 186 lb 3.2 oz (84.5 kg)   Physical Exam Vitals reviewed.  Constitutional:       Appearance: Normal appearance.  Cardiovascular:     Rate and Rhythm: Normal rate and regular rhythm.     Pulses: Normal pulses.     Heart sounds: Normal heart sounds.  Pulmonary:     Effort: Pulmonary effort is normal.     Breath sounds: Normal breath  sounds.  Musculoskeletal:     Right lower leg: No edema.     Left lower leg: No edema.  Neurological:     General: No focal deficit present.     Mental Status: He is alert and oriented to person, place, and time.  Psychiatric:        Mood and Affect: Mood normal.        Behavior: Behavior normal.     LABORATORY DATA:  I have reviewed the labs as listed.  CBC Latest Ref Rng & Units 12/22/2019 11/24/2019 10/13/2019  WBC 4.0 - 10.5 K/uL 3.8(L) 5.2 5.1  Hemoglobin 13.0 - 17.0 g/dL 13.0 13.6 12.8(L)  Hematocrit 39.0 - 52.0 % 39.9 40.6 40.2  Platelets 150 - 400 K/uL 124(L) 105(L) 159   CMP Latest Ref Rng & Units 12/22/2019 11/24/2019 10/13/2019  Glucose 70 - 99 mg/dL 121(H) 89 90  BUN 8 - 23 mg/dL 23 17 24(H)  Creatinine 0.61 - 1.24 mg/dL 0.84 0.66 0.61  Sodium 135 - 145 mmol/L 139 141 140  Potassium 3.5 - 5.1 mmol/L 3.9 3.8 4.3  Chloride 98 - 111 mmol/L 104 104 105  CO2 22 - 32 mmol/L _0 Calcium 8.9 - 10.3 mg/dL 9.0 8.5(L) 8.8(L)  Total Protein 6.5 - 8.1 g/dL 5.1(L) 5.2(L) 5.2(L)  Total Bilirubin 0.3 - 1.2 mg/dL 0.7 0.7 0.5  Alkaline Phos 38 - 126 U/L 18(L) 18(L) 20(L)  AST 15 - 41 U/L 14(L) 13(L) 12(L)  ALT 0 - 44 U/L _1 Lab Results  Component Value Date   LDH 113 12/22/2019   LDH 124 10/13/2019   LDH 117 08/18/2019   Lab Results  Component Value Date   TOTALPROTELP 4.8 (L) 12/22/2019   ALBUMINELP 3.0 12/22/2019   A1GS 0.2 12/22/2019   A2GS 0.6 12/22/2019   BETS 0.7 12/22/2019   BETA2SER 5.3 04/07/2014   GAMS 0.4 12/22/2019   MSPIKE 0.1 (H) 12/22/2019   SPEI Comment 12/22/2019    Lab Results  Component Value Date   KPAFRELGTCHN 21.0 (H) 12/22/2019   LAMBDASER 7.0 12/22/2019   KAPLAMBRATIO 3.00 (H)  12/22/2019    DIAGNOSTIC IMAGING:  I have independently reviewed the scans and discussed with the patient. MR Lumbar Spine W Wo Contrast  Result Date: 01/15/2020 CLINICAL DATA:  84 year old male with multiple myeloma. Falls. Pain. EXAM: MRI LUMBAR SPINE WITHOUT AND WITH CONTRAST TECHNIQUE: Multiplanar and multiecho pulse sequences of the lumbar spine were obtained without and with intravenous contrast. CONTRAST:  7.47m GADAVIST GADOBUTROL 1 MMOL/ML IV SOLN COMPARISON:  Thoracic and lumbar MRI 04/27/2019. FINDINGS: Segmentation: Same numbering system as on the March comparison designating normal lumbar segmentation. Alignment: Straightening of lumbar lordosis is stable since March. Mild superimposed levoconvex lumbar scoliosis is stable. Stable mild retrolisthesis of L2 on L3 and L3 on L4. Vertebrae: Heterogeneous marrow signal throughout the visible spine and pelvis, with areas of intrinsic increased T1 signal in both the upper lumbar vertebrae and visible sacrum perhaps related to radiation therapy. Pathologic compression fractures of L3 (mild) and L4 (mild-to-moderate) appear not significantly changed since March, along with associated marrow edema. No new osseous lesion identified. Conus medullaris and cauda equina: Conus extends to the T12-L1 level. No lower spinal cord or conus signal abnormality. Capacious spinal canal at most levels (see details below). No abnormal intradural enhancement. No dural thickening. Paraspinal and other soft tissues: Stable visible abdominal and pelvic viscera, paraspinal soft tissues. Disc levels: Lumbar spine degeneration appears stable since  March with the most pronounced findings L2-L3 through L4-L5 as below: L2-L3: Circumferential disc bulge and endplate spurring superimposed on mild retropulsion with mild lateral recess stenosis, mild to moderate foraminal stenosis. This level is stable. L3-L4: Compression fractures with mild retropulsion and circumferential disc bulge  superimposed on moderate facet and ligament flavum hypertrophy greater on the left. Severe left lateral recess stenosis. Moderate to severe spinal stenosis. Moderate to severe bilateral L3 foraminal stenosis. This level is stable. L4-L5: Circumferential although mostly far lateral disc bulging and endplate spurring superimposed on mild to moderate posterior element hypertrophy. Moderate to severe left L4 foraminal stenosis. This level stable. IMPRESSION: Stable MRI appearance of the lumbar spine since March. Stable pathologic compression fractures of L3 and L4, including associated marrow edema. Stable superimposed lumbar spine degeneration, and subsequent multifactorial moderate to severe spinal and foraminal stenosis at L3-L4, on the left at L4-L5. Electronically Signed   By: Genevie Ann M.D.   On: 01/15/2020 19:55   MR Pelvis W Wo Contrast  Result Date: 01/16/2020 CLINICAL DATA:  Right worse than left pelvic pain in a patient with a history of multiple myeloma and multiple falls. EXAM: MRI PELVIS WITHOUT AND WITH CONTRAST TECHNIQUE: Multiplanar multisequence MR imaging of the pelvis was performed both before and after administration of intravenous contrast. CONTRAST:  7.5 mL GADAVIST IV SOLN COMPARISON:  MRI lumbar spine 01/15/2020. MRI of the pelvis 04/27/2019. FINDINGS: Bones/Joint/Cartilage There is new flattening of the superior aspect of the right femoral head measuring approximately 2.1 cm transverse by 2.5 cm AP with marrow edema most consistent with an insufficiency or stress fracture. The patient also has a nondisplaced fracture of the inferior left sacrum with minimal associated marrow edema. This fracture is present on the prior pelvic MRI. Fractures of the lower lumbar spine are noted described on patient's recent lumbar spine MRI. There is an enhancing lesion in the left sacrum measuring 1.1 cm in diameter, unchanged. No other focal lesion is identified. Marrow signal is heterogeneous. The patient  has severe cartilage loss about the right hip with associated joint space narrowing. Mild osteophytosis about the femoral heads bilaterally is worse on the right. Ligaments Negative. Muscles and Tendons Negative. Soft tissues Massive prostatomegaly is noted. IMPRESSION: Findings most consistent with an acute or subacute subchondral insufficiency fracture of the right femoral head. Remote, nondisplaced fracture of the inferior left sacrum is seen as on the prior MRI. Single 1.1 cm enhancing lesion in the superior left sacrum may be secondary to multiple myeloma and is unchanged compared to the prior pelvic MRI. Advanced right hip osteoarthritis. Electronically Signed   By: Inge Rise M.D.   On: 01/16/2020 11:21     ASSESSMENT:   1.Recurrent IgG kappa multiple myeloma:  -RVD from 04/07/2014 through 07/28/2014 followed by maintenance Revlimid, which was discontinued. -MRI of the brain on 02/09/2017 showed right temporal lesion, status post radiation, PET/CT on January 2019 showed improvement in multiple myeloma with no new lesions. -Velcade changed from every other week on 05/06/2017 secondary to neuropathy. -MRI of the thoracic and lumbar spine on 09/03/2017 showed new rib lesion close to the spine on the left side, possibly eighth rib.  There was soft tissue mass in that area.  There is chronic L4 compression fracture which is stable. -Status post XRT to the left eighth rib lesion, 30 Gray in 10 fractions from 827 2019-9 12/2017 -DPD regimen started on 09/17/2017, developed erythematous maculopapular rash involving the trunk after taking 3 pills of pomalidomide. -Daratumumab  restarted after radiation on 10/28/2017, Pomalyst completely discontinued on 02/12/2018 for recurrent rash. -Subcu daratumumab on 10/25/2018.  He takes dexamethasone 10 mg on days of treatment. -On monthly daratumumab and dexamethasone. -He tried taking revelimid, had rash again, so stopped, no rash today.   PLAN:   1. IgG kappa  myeloma:  - He is currently on daratumumab and dex. - Dr Raliegh Ip recommended adding revelimid, pt tried it for 4 days, broke out in a rash and hence held it. --Todays myeloma labs are pending, mild worsening of anemia, Total protein low, no hypocalcemia, Alk phoslow, creatinine 0.59. --Today he will proceed with Darzalex.  I have reviewed his labs which were grossly within normal limits. --- Recommended to continue darzalex for now, RTC in 4 weeks and discuss further plan with Dr Raliegh Ip.  2. Low back pain: -Continue oxycodone as needed.  He takes up to 3 pills/day.  3. Peripheral neuropathy: -Continue gabapentin 3 times daily.  4. Bone protection: -Continue Zometa every 8 weeks.  Calcium is normal.  5. Hypokalemia: -Continue home potassium supplements.  Potassium is normal today.  7. Recent MRI suggest new right femur insufficiency fracture. Will recommend ortho referral   Orders placed this encounter:  No orders of the defined types were placed in this encounter.  Benay Pike MD

## 2020-01-19 ENCOUNTER — Inpatient Hospital Stay (HOSPITAL_COMMUNITY): Payer: Medicare Other

## 2020-01-19 ENCOUNTER — Other Ambulatory Visit: Payer: Self-pay

## 2020-01-19 ENCOUNTER — Inpatient Hospital Stay (HOSPITAL_COMMUNITY): Payer: Medicare Other | Attending: Hematology and Oncology | Admitting: Hematology and Oncology

## 2020-01-19 ENCOUNTER — Encounter (HOSPITAL_COMMUNITY): Payer: Self-pay | Admitting: Hematology and Oncology

## 2020-01-19 VITALS — BP 109/48 | HR 56 | Temp 96.9°F | Resp 18 | Wt 195.6 lb

## 2020-01-19 DIAGNOSIS — C9000 Multiple myeloma not having achieved remission: Secondary | ICD-10-CM

## 2020-01-19 DIAGNOSIS — Z79899 Other long term (current) drug therapy: Secondary | ICD-10-CM | POA: Insufficient documentation

## 2020-01-19 DIAGNOSIS — Z5112 Encounter for antineoplastic immunotherapy: Secondary | ICD-10-CM | POA: Diagnosis not present

## 2020-01-19 LAB — CBC WITH DIFFERENTIAL/PLATELET
Abs Immature Granulocytes: 0.01 10*3/uL (ref 0.00–0.07)
Basophils Absolute: 0 10*3/uL (ref 0.0–0.1)
Basophils Relative: 0 %
Eosinophils Absolute: 0 10*3/uL (ref 0.0–0.5)
Eosinophils Relative: 1 %
HCT: 37 % — ABNORMAL LOW (ref 39.0–52.0)
Hemoglobin: 12 g/dL — ABNORMAL LOW (ref 13.0–17.0)
Immature Granulocytes: 0 %
Lymphocytes Relative: 35 %
Lymphs Abs: 1.4 10*3/uL (ref 0.7–4.0)
MCH: 31.5 pg (ref 26.0–34.0)
MCHC: 32.4 g/dL (ref 30.0–36.0)
MCV: 97.1 fL (ref 80.0–100.0)
Monocytes Absolute: 0.8 10*3/uL (ref 0.1–1.0)
Monocytes Relative: 21 %
Neutro Abs: 1.7 10*3/uL (ref 1.7–7.7)
Neutrophils Relative %: 43 %
Platelets: 109 10*3/uL — ABNORMAL LOW (ref 150–400)
RBC: 3.81 MIL/uL — ABNORMAL LOW (ref 4.22–5.81)
RDW: 14.7 % (ref 11.5–15.5)
WBC: 4 10*3/uL (ref 4.0–10.5)
nRBC: 0 % (ref 0.0–0.2)

## 2020-01-19 LAB — LACTATE DEHYDROGENASE: LDH: 116 U/L (ref 98–192)

## 2020-01-19 LAB — COMPREHENSIVE METABOLIC PANEL
ALT: 12 U/L (ref 0–44)
AST: 14 U/L — ABNORMAL LOW (ref 15–41)
Albumin: 3.2 g/dL — ABNORMAL LOW (ref 3.5–5.0)
Alkaline Phosphatase: 24 U/L — ABNORMAL LOW (ref 38–126)
Anion gap: 4 — ABNORMAL LOW (ref 5–15)
BUN: 21 mg/dL (ref 8–23)
CO2: 27 mmol/L (ref 22–32)
Calcium: 8.5 mg/dL — ABNORMAL LOW (ref 8.9–10.3)
Chloride: 104 mmol/L (ref 98–111)
Creatinine, Ser: 0.59 mg/dL — ABNORMAL LOW (ref 0.61–1.24)
GFR, Estimated: >60 mL/min (ref >60)
Glucose, Bld: 103 mg/dL — ABNORMAL HIGH (ref 70–99)
Potassium: 4.1 mmol/L (ref 3.5–5.1)
Sodium: 135 mmol/L (ref 135–145)
Total Bilirubin: 0.6 mg/dL (ref 0.3–1.2)
Total Protein: 4.8 g/dL — ABNORMAL LOW (ref 6.5–8.1)

## 2020-01-19 LAB — MAGNESIUM: Magnesium: 1.9 mg/dL (ref 1.7–2.4)

## 2020-01-19 MED ORDER — DIPHENHYDRAMINE HCL 25 MG PO CAPS
ORAL_CAPSULE | ORAL | Status: AC
  Filled 2020-01-19: qty 2

## 2020-01-19 MED ORDER — PROCHLORPERAZINE MALEATE 10 MG PO TABS
ORAL_TABLET | ORAL | Status: AC
  Filled 2020-01-19: qty 1

## 2020-01-19 MED ORDER — HEPARIN SOD (PORK) LOCK FLUSH 100 UNIT/ML IV SOLN
500.0000 [IU] | Freq: Once | INTRAVENOUS | Status: AC | PRN
  Administered 2020-01-19: 13:00:00 500 [IU]

## 2020-01-19 MED ORDER — ACETAMINOPHEN 325 MG PO TABS
ORAL_TABLET | ORAL | Status: AC
  Filled 2020-01-19: qty 2

## 2020-01-19 MED ORDER — DEXAMETHASONE 4 MG PO TABS
ORAL_TABLET | ORAL | Status: AC
  Filled 2020-01-19: qty 5

## 2020-01-19 MED ORDER — ACETAMINOPHEN 325 MG PO TABS
650.0000 mg | ORAL_TABLET | Freq: Once | ORAL | Status: AC
Start: 2020-01-19 — End: 2020-01-19
  Administered 2020-01-19: 11:00:00 650 mg via ORAL

## 2020-01-19 MED ORDER — PROCHLORPERAZINE MALEATE 10 MG PO TABS
10.0000 mg | ORAL_TABLET | Freq: Once | ORAL | Status: AC
  Administered 2020-01-19: 11:00:00 10 mg via ORAL

## 2020-01-19 MED ORDER — ZOLEDRONIC ACID 4 MG/100ML IV SOLN
4.0000 mg | Freq: Once | INTRAVENOUS | Status: AC
  Administered 2020-01-19: 11:00:00 4 mg via INTRAVENOUS
  Filled 2020-01-19: qty 100

## 2020-01-19 MED ORDER — DIPHENHYDRAMINE HCL 25 MG PO CAPS
50.0000 mg | ORAL_CAPSULE | Freq: Once | ORAL | Status: AC
  Administered 2020-01-19: 11:00:00 50 mg via ORAL

## 2020-01-19 MED ORDER — DARATUMUMAB-HYALURONIDASE-FIHJ 1800-30000 MG-UT/15ML ~~LOC~~ SOLN
1800.0000 mg | Freq: Once | SUBCUTANEOUS | Status: AC
  Administered 2020-01-19: 13:00:00 1800 mg via SUBCUTANEOUS
  Filled 2020-01-19: qty 15

## 2020-01-19 MED ORDER — SODIUM CHLORIDE 0.9 % IV SOLN: Freq: Once | INTRAVENOUS | Status: AC

## 2020-01-19 MED ORDER — DEXAMETHASONE 4 MG PO TABS
20.0000 mg | ORAL_TABLET | Freq: Once | ORAL | Status: AC
  Administered 2020-01-19: 11:00:00 20 mg via ORAL

## 2020-01-19 NOTE — Progress Notes (Signed)
Patient has been examined, vital signs and labs have been reviewed by Dr. Chryl Heck. ANC, Creatinine, LFTs, hemoglobin, and platelets are within treatment parameters per Dr. Chryl Heck. Patient is okay to proceed with treatment per M.D.   Tolerated Zometa infusion w/o adverse reaction.  Dean [name]. presents today for injection per the provider's orders.  Darzalex Faspro administration without incident; injection site WNL; see MAR for injection details.  Patient tolerated procedure well and without incident.  No questions or complaints noted at this time. Discharged via wheelchair in stable condition.

## 2020-01-19 NOTE — Progress Notes (Signed)
Patients port flushed without difficulty.  Good blood return noted with no bruising or swelling noted at site.  Transparent dressing applied.  Patient left accessed for chemotherapy treatment. 

## 2020-01-20 LAB — PROTEIN ELECTROPHORESIS, SERUM
A/G Ratio: 1.8 — ABNORMAL HIGH (ref 0.7–1.7)
Albumin ELP: 2.9 g/dL (ref 2.9–4.4)
Alpha-1-Globulin: 0.2 g/dL (ref 0.0–0.4)
Alpha-2-Globulin: 0.5 g/dL (ref 0.4–1.0)
Beta Globulin: 0.6 g/dL — ABNORMAL LOW (ref 0.7–1.3)
Gamma Globulin: 0.2 g/dL — ABNORMAL LOW (ref 0.4–1.8)
Globulin, Total: 1.6 g/dL — ABNORMAL LOW (ref 2.2–3.9)
M-Spike, %: 0.1 g/dL — ABNORMAL HIGH
Total Protein ELP: 4.5 g/dL — ABNORMAL LOW (ref 6.0–8.5)

## 2020-01-20 LAB — KAPPA/LAMBDA LIGHT CHAINS
Kappa free light chain: 18.7 mg/L (ref 3.3–19.4)
Kappa, lambda light chain ratio: 2.6 — ABNORMAL HIGH (ref 0.26–1.65)
Lambda free light chains: 7.2 mg/L (ref 5.7–26.3)

## 2020-01-23 ENCOUNTER — Ambulatory Visit (INDEPENDENT_AMBULATORY_CARE_PROVIDER_SITE_OTHER): Payer: Medicare Other | Admitting: Orthopedic Surgery

## 2020-01-23 ENCOUNTER — Encounter: Payer: Self-pay | Admitting: Orthopedic Surgery

## 2020-01-23 ENCOUNTER — Other Ambulatory Visit: Payer: Self-pay

## 2020-01-23 VITALS — BP 113/62 | HR 74 | Ht 68.0 in | Wt 181.0 lb

## 2020-01-23 DIAGNOSIS — C9000 Multiple myeloma not having achieved remission: Secondary | ICD-10-CM

## 2020-01-23 DIAGNOSIS — S72051A Unspecified fracture of head of right femur, initial encounter for closed fracture: Secondary | ICD-10-CM | POA: Diagnosis not present

## 2020-01-23 DIAGNOSIS — M48061 Spinal stenosis, lumbar region without neurogenic claudication: Secondary | ICD-10-CM | POA: Diagnosis not present

## 2020-01-23 NOTE — Patient Instructions (Signed)
Call Dr. Marthann Schiller office for his covering physician to refill your oxycodone

## 2020-01-23 NOTE — Progress Notes (Signed)
Dean ALDACO Jr.  01/23/2020  Body mass index is 27.52 kg/m.  MEDICAL DECISION SECTION:  Encounter Diagnoses  Name Primary?  . Closed fracture of head of right femur, initial encounter (Dean [name]) Yes  . Degenerative lumbar spinal stenosis   . Multiple myeloma, remission status unspecified (Dean [name])     Imaging I read the following films: MRI of the spine and MRI of the pelvis  The patient is a small subchondral fracture superior aspect of the right femoral head  He also has stable chronic fractures of the lumbar vertebrae as well as degenerative disc disease and spinal stenosis in the lumbar region  Plan:  (Rx., Inj., surg., Frx, MRI/CT, XR:2)  Most of his pain is coming from his lower back.  His exam for hip pathology was negative so no surgery is needed.  He declined physical therapy for spinal stenosis  He was released  HISTORY SECTION :  Chief Complaint  Patient presents with  . Fall    Has had buttock/ pelvic pain since multiple falls recently states about 15 falls recently     HPI  The patient presents for evaluation of right hip fracture.  84 year old male who is being treated for multiple myeloma at the cancer center presents with history of frequent falls back pain right leg pain and an MRI which shows a femoral head fracture as well as lumbar vertebrae fractures and spinal stenosis.  Patient complains of pain in his lower back and radiating pain into his right leg down to his knee   Review of Systems  Constitutional: Positive for malaise/fatigue.  Skin: Positive for itching. Negative for rash.  Neurological: Positive for weakness. Negative for tingling and sensory change.      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    Past Surgical History:  Procedure Laterality Date  . COLONOSCOPY N/A 04/19/2015   Procedure: COLONOSCOPY;  Surgeon: Danie Binder, MD;  Location: [facility];  Service: Endoscopy;  Laterality: N/A;  1:30 PM  . CYST EXCISION  1959   tail bone  . IR IMAGING GUIDED PORT INSERTION  09/25/2017     Social History   Tobacco Use  . Smoking status: Never Smoker  . Smokeless tobacco: Never Used  Vaping Use  . Vaping Use: Never used  Substance Use Topics  . Alcohol use: No    Comment: " Not much no more"  . Drug use: No       Allergies  Allergen Reactions  . Codeine Other (See Comments)    Headache  . Morphine And Related Other (See Comments)    Makes him feel weird  . Revlimid [Lenalidomide] Other (See Comments)    "Causes me to become weak"     Current Outpatient Medications:  .  acyclovir (ZOVIRAX) 400 MG tablet, Take 1 tablet (400 mg total) by mouth 2 (two) times daily., Disp: 60 tablet, Rfl: 3 .  CALCIUM-VITAMIN D PO, Take 1 tablet by mouth 2 (two) times daily. , Disp: , Rfl:  .  fluticasone (FLONASE) 50 MCG/ACT nasal spray, Place 2 sprays into both nostrils daily., Disp: 16 g, Rfl: 6 .  gabapentin (NEURONTIN) 400 MG  capsule, TAKE 1 CAPSULE BY MOUTH 3 TIMES DAILY, Disp: 90 capsule, Rfl: 4 .  Homeopathic Products ([place]), Apply topically as needed., Disp: , Rfl:  .  lidocaine-prilocaine (EMLA) cream, Apply small amount over port one (1) hour prior to appointment., Disp: 30 g, Rfl: 0 .  montelukast (SINGULAIR) 10 MG tablet, Take 1 tablet (10 mg total) by mouth at bedtime., Disp: 30 tablet, Rfl: 3 .  omeprazole (PRILOSEC) 20 MG capsule, TAKE 1 CAPSULE BY MOUTH ONCE DAILY, Disp: 30 capsule, Rfl:  5 .  Oxycodone HCl 10 MG TABS, Take 1 tablet (10 mg total) by mouth every 8 (eight) hours as needed. for pain, Disp: 120 tablet, Rfl: 0 .  senna-docusate (SENOKOT-S) 8.6-50 MG tablet, Take 1 tablet by mouth 2 (two) times daily., Disp: 60 tablet, Rfl: 5 .  tamsulosin (FLOMAX) 0.4 MG CAPS capsule, TAKE ONE CAPSULE BY MOUTH EVERY NIGHT ATBEDTIME, Disp: 30 capsule, Rfl: 11 .  traZODone (DESYREL) 100 MG tablet, TAKE 1 TABLET ( 100MG TOTAL ) BY MOUTH AT BEDTIME., Disp: 60 tablet, Rfl: 3 .  zolendronic acid (ZOMETA) 4 MG/5ML injection, Inject 4 mg into the vein every 30 (thirty) days. , Disp: , Rfl:  .  lenalidomide (REVLIMID) 10 MG capsule, Take 1 capsule (10 mg total) by mouth daily. [employer] Auth # [id number] Date Obtained 12/26/2019 (Patient not taking: Reported on 01/23/2020), Disp: 21 capsule, Rfl: 0   PHYSICAL EXAM SECTION: BP 113/62   Pulse 74   Ht 5' 8"  (1.727 m)   Wt 181 lb (82.1 kg)   BMI 27.52 kg/m   Body mass index is 27.52 kg/m.   General appearance: Well-developed well-nourished no gross deformities  Lymph nodes: No lymphadenopathy  Neck is supple without palpable mass  Cardiovascular normal pulse and perfusion normal color without edema  Neurologically deep tendon reflexes are equal and normal, no sensation loss or deficits no pathologic reflexes  Psychological: Awake alert and oriented x3 mood and affect normal  Skin no lacerations or ulcerations no nodularity no palpable masses, no erythema or nodularity, lower legs show evidence of pigmentation change  Musculoskeletal:   Lumbar tenderness lumbar spine lower back right side greater than left Decreased range of motion  Right hip flexion normal internal/external rotation no pain  Left hip flexion normal internal/external rotation no pain  11:40 AM

## 2020-01-30 ENCOUNTER — Other Ambulatory Visit (HOSPITAL_COMMUNITY): Payer: Self-pay | Admitting: Hematology

## 2020-01-30 DIAGNOSIS — C9000 Multiple myeloma not having achieved remission: Secondary | ICD-10-CM

## 2020-02-17 ENCOUNTER — Other Ambulatory Visit (HOSPITAL_COMMUNITY): Payer: Self-pay | Admitting: Family Medicine

## 2020-02-17 DIAGNOSIS — C9000 Multiple myeloma not having achieved remission: Secondary | ICD-10-CM

## 2020-02-17 DIAGNOSIS — G629 Polyneuropathy, unspecified: Secondary | ICD-10-CM

## 2020-02-20 ENCOUNTER — Other Ambulatory Visit (HOSPITAL_COMMUNITY): Payer: Medicare Other

## 2020-02-20 ENCOUNTER — Ambulatory Visit (HOSPITAL_COMMUNITY): Payer: Medicare Other

## 2020-02-20 ENCOUNTER — Ambulatory Visit (HOSPITAL_COMMUNITY): Payer: Medicare Other | Admitting: Hematology

## 2020-02-22 ENCOUNTER — Other Ambulatory Visit (HOSPITAL_COMMUNITY): Payer: Self-pay

## 2020-02-22 DIAGNOSIS — G629 Polyneuropathy, unspecified: Secondary | ICD-10-CM

## 2020-02-22 DIAGNOSIS — C9000 Multiple myeloma not having achieved remission: Secondary | ICD-10-CM

## 2020-02-22 MED ORDER — GABAPENTIN 400 MG PO CAPS: 400.0000 mg | ORAL_CAPSULE | Freq: Three times a day (TID) | ORAL | 4 refills | Status: DC

## 2020-02-23 ENCOUNTER — Inpatient Hospital Stay (HOSPITAL_BASED_OUTPATIENT_CLINIC_OR_DEPARTMENT_OTHER): Payer: Medicare Other | Admitting: Hematology

## 2020-02-23 ENCOUNTER — Other Ambulatory Visit (HOSPITAL_COMMUNITY): Payer: Self-pay | Admitting: *Deleted

## 2020-02-23 ENCOUNTER — Inpatient Hospital Stay (HOSPITAL_COMMUNITY): Payer: Medicare Other

## 2020-02-23 ENCOUNTER — Inpatient Hospital Stay (HOSPITAL_COMMUNITY): Payer: Medicare Other | Attending: Hematology and Oncology

## 2020-02-23 ENCOUNTER — Other Ambulatory Visit: Payer: Self-pay

## 2020-02-23 ENCOUNTER — Encounter (HOSPITAL_COMMUNITY): Payer: Self-pay | Admitting: Hematology

## 2020-02-23 VITALS — BP 109/56 | HR 66 | Temp 96.4°F | Resp 16 | Wt 189.5 lb

## 2020-02-23 DIAGNOSIS — Z79899 Other long term (current) drug therapy: Secondary | ICD-10-CM | POA: Diagnosis not present

## 2020-02-23 DIAGNOSIS — C9000 Multiple myeloma not having achieved remission: Secondary | ICD-10-CM | POA: Insufficient documentation

## 2020-02-23 DIAGNOSIS — Z5112 Encounter for antineoplastic immunotherapy: Secondary | ICD-10-CM | POA: Insufficient documentation

## 2020-02-23 LAB — CBC WITH DIFFERENTIAL/PLATELET
Abs Immature Granulocytes: 0.01 10*3/uL (ref 0.00–0.07)
Basophils Absolute: 0 10*3/uL (ref 0.0–0.1)
Basophils Relative: 0 %
Eosinophils Absolute: 0 10*3/uL (ref 0.0–0.5)
Eosinophils Relative: 0 %
HCT: 42.5 % (ref 39.0–52.0)
Hemoglobin: 14 g/dL (ref 13.0–17.0)
Immature Granulocytes: 0 %
Lymphocytes Relative: 28 %
Lymphs Abs: 1.4 10*3/uL (ref 0.7–4.0)
MCH: 31.6 pg (ref 26.0–34.0)
MCHC: 32.9 g/dL (ref 30.0–36.0)
MCV: 95.9 fL (ref 80.0–100.0)
Monocytes Absolute: 0.5 10*3/uL (ref 0.1–1.0)
Monocytes Relative: 10 %
Neutro Abs: 3.1 10*3/uL (ref 1.7–7.7)
Neutrophils Relative %: 62 %
Platelets: 104 10*3/uL — ABNORMAL LOW (ref 150–400)
RBC: 4.43 MIL/uL (ref 4.22–5.81)
RDW: 13.6 % (ref 11.5–15.5)
WBC: 5 10*3/uL (ref 4.0–10.5)
nRBC: 0 % (ref 0.0–0.2)

## 2020-02-23 LAB — COMPREHENSIVE METABOLIC PANEL
ALT: 13 U/L (ref 0–44)
AST: 14 U/L — ABNORMAL LOW (ref 15–41)
Albumin: 3.4 g/dL — ABNORMAL LOW (ref 3.5–5.0)
Alkaline Phosphatase: 21 U/L — ABNORMAL LOW (ref 38–126)
Anion gap: 8 (ref 5–15)
BUN: 21 mg/dL (ref 8–23)
CO2: 28 mmol/L (ref 22–32)
Calcium: 8.8 mg/dL — ABNORMAL LOW (ref 8.9–10.3)
Chloride: 103 mmol/L (ref 98–111)
Creatinine, Ser: 0.75 mg/dL (ref 0.61–1.24)
GFR, Estimated: >60 mL/min (ref >60)
Glucose, Bld: 92 mg/dL (ref 70–99)
Potassium: 3.9 mmol/L (ref 3.5–5.1)
Sodium: 139 mmol/L (ref 135–145)
Total Bilirubin: 0.3 mg/dL (ref 0.3–1.2)
Total Protein: 5.3 g/dL — ABNORMAL LOW (ref 6.5–8.1)

## 2020-02-23 MED ORDER — DEXAMETHASONE 4 MG PO TABS
20.0000 mg | ORAL_TABLET | Freq: Once | ORAL | Status: AC
  Administered 2020-02-23: 20 mg via ORAL
  Filled 2020-02-23: qty 5

## 2020-02-23 MED ORDER — PROCHLORPERAZINE MALEATE 10 MG PO TABS
10.0000 mg | ORAL_TABLET | Freq: Once | ORAL | Status: AC
  Administered 2020-02-23: 10 mg via ORAL
  Filled 2020-02-23: qty 1

## 2020-02-23 MED ORDER — HEPARIN SOD (PORK) LOCK FLUSH 100 UNIT/ML IV SOLN
500.0000 [IU] | Freq: Once | INTRAVENOUS | Status: AC | PRN
  Administered 2020-02-23: 500 [IU]

## 2020-02-23 MED ORDER — DARATUMUMAB-HYALURONIDASE-FIHJ 1800-30000 MG-UT/15ML ~~LOC~~ SOLN
1800.0000 mg | Freq: Once | SUBCUTANEOUS | Status: AC
  Administered 2020-02-23: 1800 mg via SUBCUTANEOUS
  Filled 2020-02-23: qty 15

## 2020-02-23 MED ORDER — SODIUM CHLORIDE 0.9% FLUSH
10.0000 mL | INTRAVENOUS | Status: DC | PRN
  Administered 2020-02-23: 10 mL

## 2020-02-23 MED ORDER — DIPHENHYDRAMINE HCL 25 MG PO CAPS
50.0000 mg | ORAL_CAPSULE | Freq: Once | ORAL | Status: AC
  Administered 2020-02-23: 50 mg via ORAL
  Filled 2020-02-23: qty 2

## 2020-02-23 MED ORDER — SODIUM CHLORIDE 0.9 % IV SOLN: Freq: Once | INTRAVENOUS | Status: DC

## 2020-02-23 MED ORDER — ACETAMINOPHEN 325 MG PO TABS
650.0000 mg | ORAL_TABLET | Freq: Once | ORAL | Status: AC
  Administered 2020-02-23: 650 mg via ORAL
  Filled 2020-02-23: qty 2

## 2020-02-23 NOTE — Patient Instructions (Signed)
[place] at [facility] Discharge Instructions  You were seen today by Dr. Delton Coombes. He went over your recent results. You received your treatment today. Due to the rash, stop taking the Revlimid and continue getting your monthly injections. Dr. Delton Coombes will see you back in 4 weeks for labs and follow up.   Thank you for choosing [place] at [facility] to provide your oncology and hematology care.  To afford each patient quality time with our provider, please arrive at least 15 minutes before your scheduled appointment time.   If you have a lab appointment with the [place] please come in thru the Main Entrance and check in at the main information desk  You need to re-schedule your appointment should you arrive 10 or more minutes late.  We strive to give you quality time with our providers, and arriving late affects you and other patients whose appointments are after yours.  Also, if you no show three or more times for appointments you may be dismissed from the clinic at the providers discretion.     Again, thank you for choosing [facility].  Our hope is that these requests will decrease the amount of time that you wait before being seen by our physicians.       _____________________________________________________________  Should you have questions after your visit to [facility], please contact our office at [phone] between the hours of 8:00 a.m. and 4:30 p.m.  Voicemails left after 4:00 p.m. will not be returned until the following business day.  For prescription refill requests, have your pharmacy contact our office and allow 72 hours.    Cancer Center Support Programs:   > Cancer Support Group  2nd Tuesday of the month 1pm-2pm, Journey Room

## 2020-02-23 NOTE — Progress Notes (Signed)
Patient tolerated daratumumab injection with no complaints voiced.  Lab work reviewed.  See MAR for details.  Injection site clean and dry with no bruising or swelling noted.  Band aid applied.  VSS.  Patient left in satisfactory condition with no s/s of distress noted.

## 2020-02-23 NOTE — Progress Notes (Signed)
Patient assessed and labs reviewed by Dr. Katragadda. Okay to proceed with treatment. Primary RN and pharmacy aware. 

## 2020-02-23 NOTE — Progress Notes (Signed)
[place], [place] [address]   CLINIC:  Medical Oncology/Hematology  PCP:  Dean Frizzle, MD [address] [place] [address] [phone]   REASON FOR VISIT:  Follow-up for IgG kappa multiple myeloma  PRIOR THERAPY: Velcade x 6 cycles from 04/07/2014 to 07/28/2014; x 7 cycles from 02/18/2017 to 08/26/2017  NGS Results: Not done  CURRENT THERAPY: Darzalex Faspro every 4 weeks  BRIEF ONCOLOGIC HISTORY:  Oncology History  Multiple myeloma without remission ([place])  11/16/2012 Initial Diagnosis   L occipital condyle destructive lesion, not biopsied secondary to location. XRT   12/01/2012 Imaging   L3 superior endplate compression FX, no lytic lesions to suggest myeloma   02/06/2014 Imaging   soft tissue mass along the right posterior fifth rib with some rib destruction   02/16/2014 Miscellaneous   Normal CBC, Normal CMP, kappa,lamda ratio of 2.62 (abnormal), UIEP with slightly restricted band, IgG kappa, SPEP/IEP with monoclonal protein at 0.79 g/dl, normal igG, suppressed IGM   02/20/2014 Bone Marrow Biopsy   33% kappa restricted plasma cells Normal FISH, normal cytogenetics   02/21/2014 - 03/08/2014 Radiation Therapy   30Gy to R rib lesion, lesion not biopsied   03/16/2014 PET scan   Scattered hypermetabolic osseous lesions, including the calvarium, ribs, left scapula, sacrum, and left femoral shaft. An expansile left vertebral body lesion at L3 extends into the epidural space of the left lateral recess,    03/17/2014 - 04/07/2014 Chemotherapy   Velcade and Dexamethasone due to initial denial of Revlimid by insurance company.  Zometa monthly.   03/22/2014 Imaging   MR_ L-Spine- Enhancing lesions compatible with multiple myeloma at L2, L3, and S1-2.   04/07/2014 - 07/28/2014 Chemotherapy   RVD with Revlimid at 25 mg days 1-14.  Revlimid dose reduced to 25 mg every other day x 14 days with 7 day respite beginning on day 1 of  cycle 2.   04/17/2014 Adverse Reaction   Revlimid-induced rash.  Treated with steroids.   07/28/2014 - 08/04/2014 Chemotherapy   Revlimid daily   08/04/2014 Adverse Reaction   Velcade-induced peripheral neuropathy   08/04/2014 Treatment Plan Change   D/C Velcade   08/11/2014 PET scan   Response to therapy. Improvement and resolution of foci of osseous hypermetabolism.   08/22/2014 - 08/28/2014 Chemotherapy   Revlimid 25 mg days 1-21 every 28 days   08/28/2014 Adverse Reaction   Revlimid-induced rash. Medication held.  Medrol dose Pak prescribed.   09/04/2014 - 01/08/2015 Chemotherapy   Revlimid 10 mg every other day, without dexamethasone   01/08/2015 - 01/15/2015 Hospital Admission   Colonic diverticular abscess with IR drain placement   03/02/2015 PET scan   Only bony uptake is low activity at site of deformity/callus at R second rib FX, high activity in sigmoid colon, advanced sigmoid divertidulosis. Abscess not resolved?   06/04/2015 Imaging   CT nonobstructive L renal calculus, diverticulosis of descending and sigmoid colon without inflammation, moderate prostatic enlargement   07/12/2015 Surgery   Diverticulitis s/p robotic sigmoid colectomy with Dr. Johney Maine   08/23/2015 PET scan   Primarily similar hypermetabolic osseous foci within the R sided ribs. new L third rib focus of hypermetab and sclerosis is favored to be related to healing FX. no soft tissue myeloma id'd. Presumed postop hypermetab and edema about the R pelvic wall    03/06/2016 PET scan   1. Reduced activity in the prior lesion such as the right second and  fifth rib lesions, activity nearly completely resolved and significantly less than added mediastinal blood pool activity. No new lesions are identified. 2. Other imaging findings of potential clinical significance: Coronary, aortic arch, and branch vessel atherosclerotic vascular disease. Aortoiliac atherosclerotic vascular disease. Enlarged prostate gland. Colonic  diverticula.   02/17/2017 PET scan   HEAD/NECK: No hypermetabolic activity in the scalp. No hypermetabolic cervical lymph nodes. CHEST: No hypermetabolic mediastinal or hilar nodes. Right upper lobe scarring/atelectasis. No suspicious pulmonary nodules on the CT scan. ABDOMEN/PELVIS: No abnormal hypermetabolic activity within the liver, pancreas, adrenal glands, or spleen. No hypermetabolic lymph nodes in the abdomen or pelvis. Atherosclerotic calcifications of the abdominal aorta and branch vessels. Left renal sinus cysts. Sigmoid diverticulosis, without evidence of diverticulitis. Prostatomegaly. SKELETON: Vague hypermetabolism involving the inferior sternum, max SUV 3.2, previously 8.2. Focal hypermetabolism involving the left sacrum, max SUV 4.0, previously 6.7. EXTREMITIES: No abnormal hypermetabolic activity in the lower extremities.   02/18/2017 -  Chemotherapy   Bortezomib 1.3mg/m2 QWk + Dexamethasone 10mg QTue/Fri --Cycle #1, 02/18/17    02/18/2017 - 08/26/2017 Chemotherapy   The patient had bortezomib SQ (VELCADE) chemo injection 3 mg, 1.3 mg/m2 = 3 mg, Subcutaneous,  Once, 7 of 9 cycles Administration: 3 mg (02/18/2017), 3 mg (02/26/2017), 3 mg (03/04/2017), 3 mg (03/11/2017), 3 mg (04/08/2017), 3 mg (03/18/2017), 3 mg (03/25/2017), 3 mg (04/01/2017), 3 mg (05/06/2017), 3 mg (05/20/2017), 3 mg (06/03/2017), 3 mg (06/17/2017), 3 mg (07/01/2017), 3 mg (07/15/2017), 3 mg (07/29/2017), 3 mg (08/12/2017), 3 mg (08/26/2017)  for chemotherapy treatment.    09/17/2017 -  Chemotherapy      Patient is on Antibody Plan: MYELOMA DARATUMUMAB/POMALIDOMIDE/DEXAMETHASONE Q28D X 7 CYCLES      CANCER STAGING: Cancer Staging No matching staging information was found for the patient.  INTERVAL HISTORY:  Mr. Dean G Juneau Jr., a 85 y.o. male, returns for routine follow-up and consideration for next cycle of immunotherapy. Dean [name] was last seen by Dr. Praveena Iruku on 01/19/2020.  Due for cycle #28 of Darzalex Faspro  today.   Overall, he tells me he has been feeling okay. He reports having pain in his right knee and a sensation of catching in his knee when he walks, but denies having any recent falls. He also continues having numbness in his left leg up to the knee and has to watch where he is walking to keep himself from falling. He reports getting a rash while on Revlimid and has stopped taking it after taking 4 tablets. He is tolerating the treatments well and his appetite is excellent. He has been taking Lasix for his leg swelling as needed.  Overall, he feels ready for next cycle of immunotherapy today.    REVIEW OF SYSTEMS:  Review of Systems  Constitutional: Positive for fatigue (50%). Negative for appetite change.  Cardiovascular: Positive for leg swelling.  Gastrointestinal: Positive for constipation (3-4 times weekly).  Musculoskeletal: Positive for gait problem (d/t R knee pain & L leg numbness).  Skin: Negative for rash (while on Revlimid).  Neurological: Positive for gait problem (d/t R knee pain & L leg numbness) and numbness (L leg up to knee).  All other systems reviewed and are negative.   PAST MEDICAL/SURGICAL HISTORY:  Past Medical History:  Diagnosis Date  . BPH (benign prostatic hyperplasia)   . Colonic diverticular abscess 01/08/2015  . Diverticulitis 12/22/14   complicated by abscess and required percutaneous drainage  . GERD (gastroesophageal reflux disease)   . H/O ETOH abuse   .   History of chemotherapy last done jan 2017  . History of radiation therapy 01/05/13-02/10/13   45 gray to left occipital condyle region  . History of radiation therapy 12/01/16-12/10/16   Parasternal nodule, chest- 24 Gy total delivered in 8 fractions, Left sacro-iliac, pelvis- 24 Gy total delivered in 8 fractions   . History of radiation therapy 02/19/17-02/22/17   right temporal scalp 30 Gy in 10 fractions  . Intra-abdominal abscess ([place])   . Multiple myeloma ([place]) 2015  . Neuropathy   .  Radiation 02/21/14-03/08/14   right posterior chest wall area 30 gray  . Radiation 04/19/14-05/02/14   lumbar spine 25 gray  . Skull lesion    Left occipital condyle  . Wrist fracture, left    x 2   Past Surgical History:  Procedure Laterality Date  . COLONOSCOPY N/A 04/19/2015   Procedure: COLONOSCOPY;  Surgeon: Danie Binder, MD;  Location: [facility];  Service: Endoscopy;  Laterality: N/A;  1:30 PM  . CYST EXCISION  1959   tail bone  . IR IMAGING GUIDED PORT INSERTION  09/25/2017    SOCIAL HISTORY:  Social History   Socioeconomic History  . Marital status: Married    Spouse name: Not on file  . Number of children: 3  . Years of education: Not on file  . Highest education level: Not on file  Occupational History    Employer: RETIRED  Tobacco Use  . Smoking status: Never Smoker  . Smokeless tobacco: Never Used  Vaping Use  . Vaping Use: Never used  Substance and Sexual Activity  . Alcohol use: No    Comment: " Not much no more"  . Drug use: No  . Sexual activity: Not on file  Other Topics Concern  . Not on file  Social History Narrative  . Not on file   Social Determinants of Health   Financial Resource Strain: Low Risk   . Difficulty of Paying Living Expenses: Not very hard  Food Insecurity: No Food Insecurity  . Worried About Charity fundraiser in the Last Year: Never true  . Ran Out of Food in the Last Year: Never true  Transportation Needs: No Transportation Needs  . Lack of Transportation (Medical): No  . Lack of Transportation (Non-Medical): No  Physical Activity: Inactive  . Days of Exercise per Week: 0 days  . Minutes of Exercise per Session: 0 min  Stress: No Stress Concern Present  . Feeling of Stress : Not at all  Social Connections: Moderately Isolated  . Frequency of Communication with Friends and Family: More than three times a week  . Frequency of Social Gatherings with Friends and Family: Once a week  . Attends Religious Services: Never  .  Active Member of Clubs or Organizations: No  . Attends Archivist Meetings: Never  . Marital Status: Married  Human resources officer Violence: Not At Risk  . Fear of Current or Ex-Partner: No  . Emotionally Abused: No  . Physically Abused: No  . Sexually Abused: No    FAMILY HISTORY:  Family History  Problem Relation Age of Onset  . Diabetes Mother   . Stroke Mother   . Hypertension Father     CURRENT MEDICATIONS:  Current Outpatient Medications  Medication Sig Dispense Refill  . acyclovir (ZOVIRAX) 400 MG tablet Take 1 tablet (400 mg total) by mouth 2 (two) times daily. 60 tablet 3  . ALPRAZolam (XANAX) 1 MG tablet Take 1 mg by mouth 3 (three) times  daily as needed.    . CALCIUM-VITAMIN D PO Take 1 tablet by mouth 2 (two) times daily.     . dexamethasone (DECADRON) 4 MG tablet     . diazepam (VALIUM) 10 MG tablet TAKE 1 TABLET BY MOUTH DAILY AS NEEDED FOR SLEEP. STOP XANAX AND TRAZODONE 30 tablet 5  . fluticasone (FLONASE) 50 MCG/ACT nasal spray Place 2 sprays into both nostrils daily. 16 g 6  . furosemide (LASIX) 20 MG tablet Take 20 mg by mouth daily.    . gabapentin (NEURONTIN) 400 MG capsule Take 1 capsule (400 mg total) by mouth 3 (three) times daily. 90 capsule 4  . Homeopathic Products (THERAWORX RELIEF EX) Apply topically as needed.    . lidocaine-prilocaine (EMLA) cream Apply small amount over port one (1) hour prior to appointment. 30 g 0  . montelukast (SINGULAIR) 10 MG tablet Take 1 tablet (10 mg total) by mouth at bedtime. 30 tablet 3  . omeprazole (PRILOSEC) 20 MG capsule TAKE 1 CAPSULE BY MOUTH ONCE DAILY 30 capsule 5  . Oxycodone HCl 10 MG TABS TAKE 1 TABLET BY MOUTH EVERY 8 HOURS AS NEEDED FOR PAIN. 120 tablet 0  . senna-docusate (SENOKOT-S) 8.6-50 MG tablet Take 1 tablet by mouth 2 (two) times daily. 60 tablet 5  . tamsulosin (FLOMAX) 0.4 MG CAPS capsule TAKE ONE CAPSULE BY MOUTH EVERY NIGHT ATBEDTIME 30 capsule 11  . traZODone (DESYREL) 100 MG tablet TAKE 1  TABLET ( 100MG TOTAL ) BY MOUTH AT BEDTIME. 60 tablet 3  . zolendronic acid (ZOMETA) 4 MG/5ML injection Inject 4 mg into the vein every 30 (thirty) days.     . lenalidomide (REVLIMID) 10 MG capsule Take 1 capsule (10 mg total) by mouth daily. [employer] Auth # [id number] Date Obtained 12/26/2019 (Patient not taking: No sig reported) 21 capsule 0   No current facility-administered medications for this visit.    ALLERGIES:  Allergies  Allergen Reactions  . Codeine Other (See Comments)    Headache  . Morphine And Related Other (See Comments)    Makes him feel weird  . Revlimid [Lenalidomide] Other (See Comments)    "Causes me to become weak"    PHYSICAL EXAM:  Performance status (ECOG): 1 - Symptomatic but completely ambulatory  Vitals:   02/23/20 0853  BP: (!) 109/56  Pulse: 66  Resp: 16  Temp: (!) 96.4 F (35.8 C)  SpO2: 97%   Wt Readings from Last 3 Encounters:  02/23/20 189 lb 8 oz (86 kg)  01/23/20 181 lb (82.1 kg)  01/19/20 195 lb 9.6 oz (88.7 kg)   Physical Exam Vitals reviewed.  Constitutional:      Appearance: Normal appearance.  Cardiovascular:     Rate and Rhythm: Normal rate and regular rhythm.     Pulses: Normal pulses.     Heart sounds: Normal heart sounds.  Pulmonary:     Effort: Pulmonary effort is normal.     Breath sounds: Normal breath sounds.  Musculoskeletal:     Right lower leg: No edema.     Left lower leg: No edema.  Neurological:     General: No focal deficit present.     Mental Status: He is alert and oriented to person, place, and time.  Psychiatric:        Mood and Affect: Mood normal.        Behavior: Behavior normal.     LABORATORY DATA:  I have reviewed the labs as listed.  CBC Latest Ref Rng &   Units 02/23/2020 01/19/2020 12/22/2019  WBC 4.0 - 10.5 K/uL 5.0 4.0 3.8(L)  Hemoglobin 13.0 - 17.0 g/dL 14.0 12.0(L) 13.0  Hematocrit 39.0 - 52.0 % 42.5 37.0(L) 39.9  Platelets 150 - 400 K/uL 104(L) 109(L) 124(L)   CMP Latest Ref Rng &  Units 02/23/2020 01/19/2020 12/22/2019  Glucose 70 - 99 mg/dL 92 103(H) 121(H)  BUN 8 - 23 mg/dL 21 21 23  Creatinine 0.61 - 1.24 mg/dL 0.75 0.59(L) 0.84  Sodium 135 - 145 mmol/L 139 135 139  Potassium 3.5 - 5.1 mmol/L 3.9 4.1 3.9  Chloride 98 - 111 mmol/L 103 104 104  CO2 22 - 32 mmol/L 28 27 28  Calcium 8.9 - 10.3 mg/dL 8.8(L) 8.5(L) 9.0  Total Protein 6.5 - 8.1 g/dL 5.3(L) 4.8(L) 5.1(L)  Total Bilirubin 0.3 - 1.2 mg/dL 0.3 0.6 0.7  Alkaline Phos 38 - 126 U/L 21(L) 24(L) 18(L)  AST 15 - 41 U/L 14(L) 14(L) 14(L)  ALT 0 - 44 U/L 13 12 10    DIAGNOSTIC IMAGING:  I have independently reviewed the scans and discussed with the patient. No results found.   ASSESSMENT:  1.Recurrent IgG kappa multiple myeloma: -RVD from 04/07/2014 through 07/28/2014 followed by maintenance Revlimid, which was discontinued. -MRI of the brain on 02/09/2017 showed right temporal lesion, status post radiation, PET/CT on January 2019 showed improvement in multiple myeloma with no new lesions. -Velcade changed from every other week on 05/06/2017 secondary to neuropathy. -MRI of the thoracic and lumbar spine on 09/03/2017 showed new rib lesion close to the spine on the left side, possibly eighth rib. There was soft tissue mass in that area. There is chronic L4 compression fracture which is stable. -Status post XRT to the left eighth rib lesion, 30 Gray in 10 fractions from 827 2019-9 12/2017 -DPD regimen started on 09/17/2017, developed erythematous maculopapular rash involving the trunk after taking 3 pills of pomalidomide. -Daratumumab restarted after radiation on 10/28/2017, Pomalyst completely discontinued on 02/12/2018 for recurrent rash. -Subcu daratumumab on 10/25/2018. He takes dexamethasone 10 mg on days of treatment. -On monthly daratumumab and dexamethasone. -Revlimid 10 mg 3 weeks on 1 week off was supposed to be started on 11/24/2019.   Revlimid discontinued after first cycle in December secondary to  rash.  PLAN:  1. IgG kappa myeloma: -He had severe rash with Revlimid which was discontinued in December. - He is tolerating single agent daratumumab very well. - Reviewed labs from 01/19/2020 which showed M spike of 0.1 g.  Free light chain ratio improved to 2.6.  I have reviewed MRI of the lower back and pelvis. - Today's labs showed slight thrombocytopenia of 104. - He will proceed with Darzalex and return to clinic in 4 weeks.  2. Low back pain: -Continue oxycodone as needed.  He is requiring up to 3 pills/day.  3. Peripheral neuropathy: -Continue gabapentin 3 times a day.  4. Bone protection: -Continue Zometa every 8 weeks.  5. Sleeping difficulty: -Continue Valium 10 mg at bedtime as needed.  6. Hypokalemia: -Continue home potassium supplements.   Orders placed this encounter:  Orders Placed This Encounter  Procedures  . CBC with Differential/Platelet  . Comprehensive metabolic panel  . Magnesium      , MD [facility] [phone]   I, Daniel Khashchuk, am acting as a scribe for Dr.  Katagadda.  I,   MD, have reviewed the above documentation for accuracy and completeness, and I agree with the above.     

## 2020-03-12 ENCOUNTER — Other Ambulatory Visit: Payer: Self-pay

## 2020-03-12 ENCOUNTER — Telehealth (INDEPENDENT_AMBULATORY_CARE_PROVIDER_SITE_OTHER): Payer: Medicare Other | Admitting: Nurse Practitioner

## 2020-03-12 DIAGNOSIS — R059 Cough, unspecified: Secondary | ICD-10-CM

## 2020-03-12 DIAGNOSIS — J069 Acute upper respiratory infection, unspecified: Secondary | ICD-10-CM

## 2020-03-12 MED ORDER — GUAIFENESIN ER 600 MG PO TB12: 600.0000 mg | ORAL_TABLET | Freq: Two times a day (BID) | ORAL | 0 refills | Status: DC | PRN

## 2020-03-12 NOTE — Patient Instructions (Signed)

## 2020-03-12 NOTE — Progress Notes (Signed)
Subjective:    Patient ID: Dean Danker., male    DOB: [birth year], 85 y.o.   MRN: [id number]  HPI: Dean Vanatta. is a 85 y.o. male presenting virtually for coughing and runny nose.  Chief Complaint  Patient presents with  . Cough  . Nasal Congestion    For 1 wk and productive coughing, sore throat, does not feel like he's been exposed although around son that had same sx. Taking tylenol and alka seltzer. Not sleeping well. Taking 3 trazodone at night. Did take the j&j vaccine.does not remember when   UPPER RESPIRATORY TRACT INFECTION Reports he takes treatment for "blood cancer - Myeloma" once per month.  Next treatment is 03/22/2020.  Went to visit his son last week and he "came down with a cold." Covid testing history: no Covid vaccine status: received the J&J Onset: 03/03/2020 Worst symptom: blowing nose and coughing up stuff Fever: no Cough: yes; productive Shortness of breath: no Wheezing: no Chest pain: no Chest tightness: no Chest congestion: yes Nasal congestion: yes Runny nose: yes  Post nasal drip: no Sneezing: no Sore throat: no  Swollen glands: no Sinus pressure: no Headache: no Face pain: no Toothache: no Ear pain: no  Ear pressure: no  Eyes red/itching:no Eye drainage/crusting: no  Nausea: no  Vomiting: no Diarrhea: no  Change in appetite: no  Loss of taste/smell: yes  Rash: no Fatigue: no Sick contacts: yes; son was sick last week Strep contacts: no  Context: better Recurrent sinusitis: no Treatments attempted: flonase, alka seltzer, Tylenol, trazodone Relief with OTC medications: yes, some  Allergies  Allergen Reactions  . Codeine Other (See Comments)    Headache  . Morphine And Related Other (See Comments)    Makes him feel weird  . Revlimid [Lenalidomide] Other (See Comments)    "Causes me to become weak"    Outpatient Encounter Medications as of 03/12/2020  Medication Sig  . acyclovir (ZOVIRAX) 400 MG tablet Take 1 tablet  (400 mg total) by mouth 2 (two) times daily.  Marland Kitchen ALPRAZolam (XANAX) 1 MG tablet Take 1 mg by mouth 3 (three) times daily as needed.  Marland Kitchen CALCIUM-VITAMIN D PO Take 1 tablet by mouth 2 (two) times daily.   Marland Kitchen dexamethasone (DECADRON) 4 MG tablet   . diazepam (VALIUM) 10 MG tablet TAKE 1 TABLET BY MOUTH DAILY AS NEEDED FOR SLEEP. STOP XANAX AND TRAZODONE  . fluticasone (FLONASE) 50 MCG/ACT nasal spray Place 2 sprays into both nostrils daily.  . furosemide (LASIX) 20 MG tablet Take 20 mg by mouth daily.  Marland Kitchen gabapentin (NEURONTIN) 400 MG capsule Take 1 capsule (400 mg total) by mouth 3 (three) times daily.  Marland Kitchen guaiFENesin (MUCINEX) 600 MG 12 hr tablet Take 1 tablet (600 mg total) by mouth 2 (two) times daily as needed for cough or to loosen phlegm.  . Homeopathic Products ([place]) Apply topically as needed.  Marland Kitchen lenalidomide (REVLIMID) 10 MG capsule Take 1 capsule (10 mg total) by mouth daily. [employer] Auth # [id number] Date Obtained 12/26/2019  . lidocaine-prilocaine (EMLA) cream Apply small amount over port one (1) hour prior to appointment.  . montelukast (SINGULAIR) 10 MG tablet Take 1 tablet (10 mg total) by mouth at bedtime.  Marland Kitchen omeprazole (PRILOSEC) 20 MG capsule TAKE 1 CAPSULE BY MOUTH ONCE DAILY  . Oxycodone HCl 10 MG TABS TAKE 1 TABLET BY MOUTH EVERY 8 HOURS AS NEEDED FOR PAIN.  Marland Kitchen senna-docusate (SENOKOT-S) 8.6-50 MG tablet Take 1  tablet by mouth 2 (two) times daily.  . tamsulosin (FLOMAX) 0.4 MG CAPS capsule TAKE ONE CAPSULE BY MOUTH EVERY NIGHT ATBEDTIME  . traZODone (DESYREL) 100 MG tablet TAKE 1 TABLET ( 100MG TOTAL ) BY MOUTH AT BEDTIME.  Marland Kitchen zolendronic acid (ZOMETA) 4 MG/5ML injection Inject 4 mg into the vein every 30 (thirty) days.    No facility-administered encounter medications on file as of 03/12/2020.    Patient Active Problem List   Diagnosis Date Noted  . Spinal stenosis, lumbar 10/13/2016  . Chronic back pain 10/01/2016  . DDD (degenerative disc disease), lumbar  10/01/2016  . OA (osteoarthritis) of knee 10/01/2016  . Diverticulitis s/p robotic sigmoid colectomy 07/11/2015 07/11/2015  . Goals of care, counseling/discussion   . Abnormal CT scan, sigmoid colon 04/02/2015  . Abnormal PET scan of colon 04/02/2015  . Thrombocytopenia ([place]) 01/09/2015  . Multiple myeloma without remission ([place]) 02/15/2014  . Allergic rhinitis 01/16/2014  . History of vertebral stress fracture 01/16/2014  . Dermatitis 01/16/2014  . Loss of weight 09/05/2013  . Insomnia 09/05/2013  . Fatigue 05/16/2013  . Neck pain 12/07/2012  . Skull lesion 11/29/2012  . Neuropathy ([place])   . BPH (benign prostatic hyperplasia)   . GERD (gastroesophageal reflux disease)   . H/O ETOH abuse     Past Medical History:  Diagnosis Date  . BPH (benign prostatic hyperplasia)   . Colonic diverticular abscess 01/08/2015  . Diverticulitis 78/93/81   complicated by abscess and required percutaneous drainage  . GERD (gastroesophageal reflux disease)   . H/O ETOH abuse   . History of chemotherapy last done jan 2017  . History of radiation therapy 01/05/13-02/10/13   45 gray to left occipital condyle region  . History of radiation therapy 12/01/16-12/10/16   Parasternal nodule, chest- 24 Gy total delivered in 8 fractions, Left sacro-iliac, pelvis- 24 Gy total delivered in 8 fractions   . History of radiation therapy 02/19/17-02/22/17   right temporal scalp 30 Gy in 10 fractions  . Intra-abdominal abscess ([place])   . Multiple myeloma ([place]) 2015  . Neuropathy   . Radiation 02/21/14-03/08/14   right posterior chest wall area 30 gray  . Radiation 04/19/14-05/02/14   lumbar spine 25 gray  . Skull lesion    Left occipital condyle  . Wrist fracture, left    x 2    Relevant past medical, surgical, family and social history reviewed and updated as indicated. Interim medical history since our last visit reviewed.  Review of Systems  Per HPI unless specifically indicated above     Objective:     There were no vitals taken for this visit.  Wt Readings from Last 3 Encounters:  02/23/20 189 lb 8 oz (86 kg)  01/23/20 181 lb (82.1 kg)  01/19/20 195 lb 9.6 oz (88.7 kg)    Physical Exam Physical examination unable to be performed due to lack of equipment.  Patient talking in complete sentences during telemedicine visit with very occasional congested-sounding cough.    Assessment & Plan:  1. Upper respiratory tract infection, unspecified type Acute, improving.  Encouraged COVID testing given immunocompromised state and patient is not boosted.  Given symptoms are improving, no need for antibiotics at this time.  Continue supportive care with nasal sprays/rinses, push hydration, guaifenesin for congestion.  With any sudden onset new chest pain, dizziness, sweating, or shortness of breath, go to ED.  If symptoms worsen in a few days, notify clinic and consider antibiotics at that point.  Patient verbalized  understanding and is in agreement to this plan.  - SARS-COV-2 RNA,(COVID-19) QUAL NAAT; Future - guaiFENesin (MUCINEX) 600 MG 12 hr tablet; Take 1 tablet (600 mg total) by mouth 2 (two) times daily as needed for cough or to loosen phlegm.  Dispense: 30 tablet; Refill: 0    Follow up plan: Return if symptoms worsen or fail to improve.  This visit was completed via telephone due to the restrictions of the COVID-19 pandemic. All issues as above were discussed and addressed but no physical exam was performed. If it was felt that the patient should be evaluated in the office, they were directed there. The patient verbally consented to this visit. Patient was unable to complete an audio/visual visit due to Lack of internet. . Location of the patient: home . Location of the provider: work . Those involved with this call:  . Provider: Noemi Chapel, DNP, FNP-C . CMA: Annabelle Harman, CMA . Front Desk/Registration: Santina Evans  . Time spent on call: 15 minutes on the phone discussing health  concerns. 30 minutes total spent in review of patient's record and preparation of their chart.  I verified patient identity using two factors (patient name and date of birth). Patient consents verbally to being seen via telemedicine visit today.

## 2020-03-14 ENCOUNTER — Other Ambulatory Visit (HOSPITAL_COMMUNITY): Payer: Self-pay | Admitting: Hematology

## 2020-03-22 ENCOUNTER — Ambulatory Visit (HOSPITAL_COMMUNITY): Payer: Medicare Other

## 2020-03-22 ENCOUNTER — Other Ambulatory Visit (HOSPITAL_COMMUNITY): Payer: Medicare Other

## 2020-03-22 ENCOUNTER — Ambulatory Visit (HOSPITAL_COMMUNITY): Payer: Medicare Other | Admitting: Hematology

## 2020-03-28 ENCOUNTER — Ambulatory Visit (HOSPITAL_COMMUNITY): Payer: Medicare Other | Admitting: Hematology

## 2020-03-28 ENCOUNTER — Other Ambulatory Visit (HOSPITAL_COMMUNITY): Payer: Medicare Other

## 2020-03-28 ENCOUNTER — Ambulatory Visit (HOSPITAL_COMMUNITY): Payer: Medicare Other

## 2020-04-02 ENCOUNTER — Inpatient Hospital Stay (HOSPITAL_COMMUNITY): Payer: Medicare Other

## 2020-04-02 ENCOUNTER — Other Ambulatory Visit: Payer: Self-pay

## 2020-04-02 ENCOUNTER — Inpatient Hospital Stay (HOSPITAL_COMMUNITY): Payer: Medicare Other | Attending: Hematology and Oncology | Admitting: Hematology

## 2020-04-02 VITALS — BP 124/54

## 2020-04-02 VITALS — BP 126/67 | HR 77 | Temp 96.8°F | Resp 18 | Wt 196.4 lb

## 2020-04-02 DIAGNOSIS — C9000 Multiple myeloma not having achieved remission: Secondary | ICD-10-CM

## 2020-04-02 DIAGNOSIS — Z79899 Other long term (current) drug therapy: Secondary | ICD-10-CM | POA: Diagnosis not present

## 2020-04-02 DIAGNOSIS — Z5112 Encounter for antineoplastic immunotherapy: Secondary | ICD-10-CM | POA: Diagnosis not present

## 2020-04-02 DIAGNOSIS — G4700 Insomnia, unspecified: Secondary | ICD-10-CM | POA: Diagnosis not present

## 2020-04-02 LAB — COMPREHENSIVE METABOLIC PANEL
ALT: 21 U/L (ref 0–44)
AST: 18 U/L (ref 15–41)
Albumin: 3.4 g/dL — ABNORMAL LOW (ref 3.5–5.0)
Alkaline Phosphatase: 21 U/L — ABNORMAL LOW (ref 38–126)
Anion gap: 12 (ref 5–15)
BUN: 23 mg/dL (ref 8–23)
CO2: 25 mmol/L (ref 22–32)
Calcium: 8.9 mg/dL (ref 8.9–10.3)
Chloride: 105 mmol/L (ref 98–111)
Creatinine, Ser: 0.81 mg/dL (ref 0.61–1.24)
GFR, Estimated: >60 mL/min (ref >60)
Glucose, Bld: 84 mg/dL (ref 70–99)
Potassium: 3.9 mmol/L (ref 3.5–5.1)
Sodium: 142 mmol/L (ref 135–145)
Total Bilirubin: 0.6 mg/dL (ref 0.3–1.2)
Total Protein: 5.3 g/dL — ABNORMAL LOW (ref 6.5–8.1)

## 2020-04-02 LAB — CBC WITH DIFFERENTIAL/PLATELET
Abs Immature Granulocytes: 0.03 10*3/uL (ref 0.00–0.07)
Basophils Absolute: 0.3 10*3/uL — ABNORMAL HIGH (ref 0.0–0.1)
Basophils Relative: 0 %
Eosinophils Absolute: 0.3 10*3/uL (ref 0.0–0.5)
Eosinophils Relative: 0 %
HCT: 45 % (ref 39.0–52.0)
Hemoglobin: 14.8 g/dL (ref 13.0–17.0)
Immature Granulocytes: 0 %
Lymphocytes Relative: 2 %
Lymphs Abs: 27.8 10*3/uL — ABNORMAL HIGH (ref 0.7–4.0)
MCH: 31.5 pg (ref 26.0–34.0)
MCHC: 32.9 g/dL (ref 30.0–36.0)
MCV: 96.2 fL (ref 80.0–100.0)
Monocytes Absolute: 10.1 10*3/uL — ABNORMAL HIGH (ref 0.1–1.0)
Monocytes Relative: 1 %
Neutro Abs: 61.2 10*3/uL — ABNORMAL HIGH (ref 1.7–7.7)
Neutrophils Relative %: 5 %
Platelets: 141 10*3/uL — ABNORMAL LOW (ref 150–400)
RBC: 4.68 MIL/uL (ref 4.22–5.81)
RDW: 4.7 % — ABNORMAL LOW (ref 11.5–15.5)
WBC: 8.9 10*3/uL (ref 4.0–10.5)

## 2020-04-02 LAB — MAGNESIUM: Magnesium: 2 mg/dL (ref 1.7–2.4)

## 2020-04-02 MED ORDER — ZOLEDRONIC ACID 4 MG/100ML IV SOLN
INTRAVENOUS | Status: AC
  Filled 2020-04-02: qty 100

## 2020-04-02 MED ORDER — DEXAMETHASONE 4 MG PO TABS
ORAL_TABLET | ORAL | Status: AC
  Filled 2020-04-02: qty 5

## 2020-04-02 MED ORDER — ALTEPLASE 2 MG IJ SOLR: 2.0000 mg | Freq: Once | INTRAMUSCULAR | Status: DC | PRN

## 2020-04-02 MED ORDER — PROCHLORPERAZINE MALEATE 10 MG PO TABS
ORAL_TABLET | ORAL | Status: AC
  Filled 2020-04-02: qty 1

## 2020-04-02 MED ORDER — DARATUMUMAB-HYALURONIDASE-FIHJ 1800-30000 MG-UT/15ML ~~LOC~~ SOLN
1800.0000 mg | Freq: Once | SUBCUTANEOUS | Status: AC
  Administered 2020-04-02: 1800 mg via SUBCUTANEOUS
  Filled 2020-04-02: qty 15

## 2020-04-02 MED ORDER — SODIUM CHLORIDE 0.9% FLUSH: 10.0000 mL | INTRAVENOUS | Status: DC | PRN

## 2020-04-02 MED ORDER — DIPHENHYDRAMINE HCL 25 MG PO CAPS
ORAL_CAPSULE | ORAL | Status: AC
  Filled 2020-04-02: qty 2

## 2020-04-02 MED ORDER — PROCHLORPERAZINE MALEATE 10 MG PO TABS
10.0000 mg | ORAL_TABLET | Freq: Once | ORAL | Status: AC
  Administered 2020-04-02: 10 mg via ORAL

## 2020-04-02 MED ORDER — SODIUM CHLORIDE 0.9 % IV SOLN: Freq: Once | INTRAVENOUS | Status: AC

## 2020-04-02 MED ORDER — ACETAMINOPHEN 325 MG PO TABS
ORAL_TABLET | ORAL | Status: AC
  Filled 2020-04-02: qty 2

## 2020-04-02 MED ORDER — ACETAMINOPHEN 325 MG PO TABS
650.0000 mg | ORAL_TABLET | Freq: Once | ORAL | Status: AC
  Administered 2020-04-02: 650 mg via ORAL

## 2020-04-02 MED ORDER — DEXAMETHASONE 4 MG PO TABS
20.0000 mg | ORAL_TABLET | Freq: Once | ORAL | Status: AC
  Administered 2020-04-02: 20 mg via ORAL

## 2020-04-02 MED ORDER — HEPARIN SOD (PORK) LOCK FLUSH 100 UNIT/ML IV SOLN: 500.0000 [IU] | Freq: Once | INTRAVENOUS | Status: DC | PRN

## 2020-04-02 MED ORDER — TRAZODONE HCL 100 MG PO TABS: 300.0000 mg | ORAL_TABLET | Freq: Every day | ORAL | 3 refills | Status: DC

## 2020-04-02 MED ORDER — DIPHENHYDRAMINE HCL 25 MG PO CAPS
50.0000 mg | ORAL_CAPSULE | Freq: Once | ORAL | Status: AC
  Administered 2020-04-02: 50 mg via ORAL

## 2020-04-02 MED ORDER — HEPARIN SOD (PORK) LOCK FLUSH 100 UNIT/ML IV SOLN
500.0000 [IU] | Freq: Once | INTRAVENOUS | Status: AC | PRN
  Administered 2020-04-02: 500 [IU]

## 2020-04-02 MED ORDER — ZOLEDRONIC ACID 4 MG/100ML IV SOLN
4.0000 mg | Freq: Once | INTRAVENOUS | Status: AC
  Administered 2020-04-02: 4 mg via INTRAVENOUS

## 2020-04-02 NOTE — Progress Notes (Signed)
Pt here for D1C29 of dara.  Pt also due for zometa today.  Calcium 8.9.  Pt is taking calcium with vitamin D.  Okay for treatment today  Tolerated treatment well today without incidence.  Discharged ambulatory in stable condition with cane.

## 2020-04-02 NOTE — Progress Notes (Signed)
[place], [place] [address]   CLINIC:  Medical Oncology/Hematology  PCP:  [name], MD [address] [place] [address] [phone]   REASON FOR VISIT:  Follow-up for IgG kappa multiple myeloma  PRIOR THERAPY: Velcade x 6 cycles from 04/07/2014 to 07/28/2014; x 7 cycles from 02/18/2017 to 08/26/2017  NGS Results: Not done  CURRENT THERAPY: Darzalex Faspro every 4 weeks  BRIEF ONCOLOGIC HISTORY:  Oncology History  Multiple myeloma without remission ([place])  11/16/2012 Initial Diagnosis   L occipital condyle destructive lesion, not biopsied secondary to location. XRT   12/01/2012 Imaging   L3 superior endplate compression FX, no lytic lesions to suggest myeloma   02/06/2014 Imaging   soft tissue mass along the right posterior fifth rib with some rib destruction   02/16/2014 Miscellaneous   Normal CBC, Normal CMP, kappa,lamda ratio of 2.62 (abnormal), UIEP with slightly restricted band, IgG kappa, SPEP/IEP with monoclonal protein at 0.79 g/dl, normal igG, suppressed IGM   02/20/2014 Bone Marrow Biopsy   33% kappa restricted plasma cells Normal FISH, normal cytogenetics   02/21/2014 - 03/08/2014 Radiation Therapy   30Gy to R rib lesion, lesion not biopsied   03/16/2014 PET scan   Scattered hypermetabolic osseous lesions, including the calvarium, ribs, left scapula, sacrum, and left femoral shaft. An expansile left vertebral body lesion at L3 extends into the epidural space of the left lateral recess,    03/17/2014 - 04/07/2014 Chemotherapy   Velcade and Dexamethasone due to initial denial of Revlimid by insurance company.  Zometa monthly.   03/22/2014 Imaging   MR_ L-Spine- Enhancing lesions compatible with multiple myeloma at L2, L3, and S1-2.   04/07/2014 - 07/28/2014 Chemotherapy   RVD with Revlimid at 25 mg days 1-14.  Revlimid dose reduced to 25 mg every other day x 14 days with 7 day respite beginning on day 1 of  cycle 2.   04/17/2014 Adverse Reaction   Revlimid-induced rash.  Treated with steroids.   07/28/2014 - 08/04/2014 Chemotherapy   Revlimid daily   08/04/2014 Adverse Reaction   Velcade-induced peripheral neuropathy   08/04/2014 Treatment Plan Change   D/C Velcade   08/11/2014 PET scan   Response to therapy. Improvement and resolution of foci of osseous hypermetabolism.   08/22/2014 - 08/28/2014 Chemotherapy   Revlimid 25 mg days 1-21 every 28 days   08/28/2014 Adverse Reaction   Revlimid-induced rash. Medication held.  Medrol dose Pak prescribed.   09/04/2014 - 01/08/2015 Chemotherapy   Revlimid 10 mg every other day, without dexamethasone   01/08/2015 - 01/15/2015 Hospital Admission   Colonic diverticular abscess with IR drain placement   03/02/2015 PET scan   Only bony uptake is low activity at site of deformity/callus at R second rib FX, high activity in sigmoid colon, advanced sigmoid divertidulosis. Abscess not resolved?   06/04/2015 Imaging   CT nonobstructive L renal calculus, diverticulosis of descending and sigmoid colon without inflammation, moderate prostatic enlargement   07/12/2015 Surgery   Diverticulitis s/p robotic sigmoid colectomy with Dr. Johney Maine   08/23/2015 PET scan   Primarily similar hypermetabolic osseous foci within the R sided ribs. new L third rib focus of hypermetab and sclerosis is favored to be related to healing FX. no soft tissue myeloma id'd. Presumed postop hypermetab and edema about the R pelvic wall    03/06/2016 PET scan   1. Reduced activity in the prior lesion such as the right second and  fifth rib lesions, activity nearly completely resolved and significantly less than added mediastinal blood pool activity. No new lesions are identified. 2. Other imaging findings of potential clinical significance: Coronary, aortic arch, and branch vessel atherosclerotic vascular disease. Aortoiliac atherosclerotic vascular disease. Enlarged prostate gland. Colonic  diverticula.   02/17/2017 PET scan   HEAD/NECK: No hypermetabolic activity in the scalp. No hypermetabolic cervical lymph nodes. CHEST: No hypermetabolic mediastinal or hilar nodes. Right upper lobe scarring/atelectasis. No suspicious pulmonary nodules on the CT scan. ABDOMEN/PELVIS: No abnormal hypermetabolic activity within the liver, pancreas, adrenal glands, or spleen. No hypermetabolic lymph nodes in the abdomen or pelvis. Atherosclerotic calcifications of the abdominal aorta and branch vessels. Left renal sinus cysts. Sigmoid diverticulosis, without evidence of diverticulitis. Prostatomegaly. SKELETON: Vague hypermetabolism involving the inferior sternum, max SUV 3.2, previously 8.2. Focal hypermetabolism involving the left sacrum, max SUV 4.0, previously 6.7. EXTREMITIES: No abnormal hypermetabolic activity in the lower extremities.   02/18/2017 -  Chemotherapy   Bortezomib 1.30m/m2 QWk + Dexamethasone 170mQTue/Fri --Cycle #1, 02/18/17    02/18/2017 - 08/26/2017 Chemotherapy   The patient had bortezomib SQ (VELCADE) chemo injection 3 mg, 1.3 mg/m2 = 3 mg, Subcutaneous,  Once, 7 of 9 cycles Administration: 3 mg (02/18/2017), 3 mg (02/26/2017), 3 mg (03/04/2017), 3 mg (03/11/2017), 3 mg (04/08/2017), 3 mg (03/18/2017), 3 mg (03/25/2017), 3 mg (04/01/2017), 3 mg (05/06/2017), 3 mg (05/20/2017), 3 mg (06/03/2017), 3 mg (06/17/2017), 3 mg (07/01/2017), 3 mg (07/15/2017), 3 mg (07/29/2017), 3 mg (08/12/2017), 3 mg (08/26/2017)  for chemotherapy treatment.    09/17/2017 -  Chemotherapy      Patient is on Antibody Plan: MYELOMA DARATUMUMAB/POMALIDOMIDE/DEXAMETHASONE Q28D X 7 CYCLES      CANCER STAGING: Cancer Staging No matching staging information was found for the patient.  INTERVAL HISTORY:  Dean [name] a [age].o. male, returns for routine follow-up and consideration for next cycle of immunotherapy. Dean [name] last seen on 02/23/2020.  Due for cycle #29 of Darzalex Faspro today.   Overall, he  tells me he has been feeling well. He complains of having sleep issues and tried Belsomra which did not help, while trazodone 3 tablets and Xanax 2 tablets helped him sleep for about 5 hours. He is taking  He continues having pain in his right hip, worst after sitting for a prolonged time; he has not taken any oxycodone for the pain for the last 1.5 weeks. His appetite has improved and is excellent. He denies having any falls recently; he continues taking gabapentin BID.  Overall, he feels ready for next cycle of immunotherapy today.    REVIEW OF SYSTEMS:  Review of Systems  Constitutional: Positive for fatigue (75%). Negative for appetite change.  HENT:   Positive for tinnitus (L ear).   Musculoskeletal: Positive for arthralgias (R hip).  Neurological: Positive for headaches and numbness (cold sensation in feet; on gabapentin).  Psychiatric/Behavioral: Positive for sleep disturbance (on trazodone & Xanax).  All other systems reviewed and are negative.   PAST MEDICAL/SURGICAL HISTORY:  Past Medical History:  Diagnosis Date  . BPH (benign prostatic hyperplasia)   . Colonic diverticular abscess 01/08/2015  . Diverticulitis 1168/12/75 complicated by abscess and required percutaneous drainage  . GERD (gastroesophageal reflux disease)   . H/O ETOH abuse   . History of chemotherapy last done jan 2017  . History of radiation therapy 01/05/13-02/10/13   45 gray to left occipital condyle region  . History of radiation therapy 12/01/16-12/10/16  Parasternal nodule, chest- 24 Gy total delivered in 8 fractions, Left sacro-iliac, pelvis- 24 Gy total delivered in 8 fractions   . History of radiation therapy 02/19/17-02/22/17   right temporal scalp 30 Gy in 10 fractions  . Intra-abdominal abscess ([place])   . Multiple myeloma ([place]) 2015  . Neuropathy   . Radiation 02/21/14-03/08/14   right posterior chest wall area 30 gray  . Radiation 04/19/14-05/02/14   lumbar spine 25 gray  . Skull lesion    Left  occipital condyle  . Wrist fracture, left    x 2   Past Surgical History:  Procedure Laterality Date  . COLONOSCOPY N/A 04/19/2015   Procedure: COLONOSCOPY;  Surgeon: Danie Binder, MD;  Location: [facility];  Service: Endoscopy;  Laterality: N/A;  1:30 PM  . CYST EXCISION  1959   tail bone  . IR IMAGING GUIDED PORT INSERTION  09/25/2017    SOCIAL HISTORY:  Social History   Socioeconomic History  . Marital status: Married    Spouse name: Not on file  . Number of children: 3  . Years of education: Not on file  . Highest education level: Not on file  Occupational History    Employer: RETIRED  Tobacco Use  . Smoking status: Never Smoker  . Smokeless tobacco: Never Used  Vaping Use  . Vaping Use: Never used  Substance and Sexual Activity  . Alcohol use: No    Comment: " Not much no more"  . Drug use: No  . Sexual activity: Not on file  Other Topics Concern  . Not on file  Social History Narrative  . Not on file   Social Determinants of Health   Financial Resource Strain: Low Risk   . Difficulty of Paying Living Expenses: Not very hard  Food Insecurity: No Food Insecurity  . Worried About Charity fundraiser in the Last Year: Never true  . Ran Out of Food in the Last Year: Never true  Transportation Needs: No Transportation Needs  . Lack of Transportation (Medical): No  . Lack of Transportation (Non-Medical): No  Physical Activity: Inactive  . Days of Exercise per Week: 0 days  . Minutes of Exercise per Session: 0 min  Stress: No Stress Concern Present  . Feeling of Stress : Not at all  Social Connections: Moderately Isolated  . Frequency of Communication with Friends and Family: More than three times a week  . Frequency of Social Gatherings with Friends and Family: Once a week  . Attends Religious Services: Never  . Active Member of Clubs or Organizations: No  . Attends Archivist Meetings: Never  . Marital Status: Married  Human resources officer  Violence: Not At Risk  . Fear of Current or Ex-Partner: No  . Emotionally Abused: No  . Physically Abused: No  . Sexually Abused: No    FAMILY HISTORY:  Family History  Problem Relation Age of Onset  . Diabetes Mother   . Stroke Mother   . Hypertension Father     CURRENT MEDICATIONS:  Current Outpatient Medications  Medication Sig Dispense Refill  . acyclovir (ZOVIRAX) 400 MG tablet Take 1 tablet (400 mg total) by mouth 2 (two) times daily. 60 tablet 3  . ALPRAZolam (XANAX) 1 MG tablet Take 1 mg by mouth 3 (three) times daily as needed.    Marland Kitchen CALCIUM-VITAMIN D PO Take 1 tablet by mouth 2 (two) times daily.     Marland Kitchen dexamethasone (DECADRON) 4 MG tablet     .  diazepam (VALIUM) 10 MG tablet TAKE 1 TABLET BY MOUTH DAILY AS NEEDED FOR SLEEP. STOP XANAX AND TRAZODONE 30 tablet 5  . fluticasone (FLONASE) 50 MCG/ACT nasal spray Place 2 sprays into both nostrils daily. 16 g 6  . furosemide (LASIX) 20 MG tablet TAKE 1 TABLET BY MOUTH ONCE A DAY AS NEEDED 30 tablet 3  . gabapentin (NEURONTIN) 400 MG capsule Take 1 capsule (400 mg total) by mouth 3 (three) times daily. 90 capsule 4  . guaiFENesin (MUCINEX) 600 MG 12 hr tablet Take 1 tablet (600 mg total) by mouth 2 (two) times daily as needed for cough or to loosen phlegm. 30 tablet 0  . Homeopathic Products ([place]) Apply topically as needed.    Marland Kitchen lenalidomide (REVLIMID) 10 MG capsule Take 1 capsule (10 mg total) by mouth daily. [employer] Auth # [id number] Date Obtained 12/26/2019 21 capsule 0  . lidocaine-prilocaine (EMLA) cream Apply small amount over port one (1) hour prior to appointment. 30 g 0  . montelukast (SINGULAIR) 10 MG tablet Take 1 tablet (10 mg total) by mouth at bedtime. 30 tablet 3  . omeprazole (PRILOSEC) 20 MG capsule TAKE 1 CAPSULE BY MOUTH ONCE DAILY 30 capsule 5  . Oxycodone HCl 10 MG TABS TAKE 1 TABLET BY MOUTH EVERY 8 HOURS AS NEEDED FOR PAIN. 120 tablet 0  . senna-docusate (SENOKOT-S) 8.6-50 MG tablet Take 1 tablet  by mouth 2 (two) times daily. 60 tablet 5  . tamsulosin (FLOMAX) 0.4 MG CAPS capsule TAKE ONE CAPSULE BY MOUTH EVERY NIGHT ATBEDTIME 30 capsule 11  . traZODone (DESYREL) 100 MG tablet TAKE 1 TABLET ( 100MG TOTAL ) BY MOUTH AT BEDTIME. 60 tablet 3  . zolendronic acid (ZOMETA) 4 MG/5ML injection Inject 4 mg into the vein every 30 (thirty) days.      No current facility-administered medications for this visit.    ALLERGIES:  Allergies  Allergen Reactions  . Codeine Other (See Comments)    Headache  . Morphine And Related Other (See Comments)    Makes him feel weird  . Revlimid [Lenalidomide] Other (See Comments)    "Causes me to become weak"    PHYSICAL EXAM:  Performance status (ECOG): 1 - Symptomatic but completely ambulatory  Vitals:   04/02/20 0832  BP: 126/67  Pulse: 77  Resp: 18  Temp: (!) 96.8 F (36 C)  SpO2: 100%   Wt Readings from Last 3 Encounters:  04/02/20 196 lb 6.4 oz (89.1 kg)  02/23/20 189 lb 8 oz (86 kg)  01/23/20 181 lb (82.1 kg)   Physical Exam Vitals reviewed.  Constitutional:      Appearance: Normal appearance.  Cardiovascular:     Rate and Rhythm: Normal rate and regular rhythm.     Pulses: Normal pulses.     Heart sounds: Normal heart sounds.  Pulmonary:     Effort: Pulmonary effort is normal.     Breath sounds: Normal breath sounds.  Chest:     Comments: Port-a-Cath in R chest Musculoskeletal:     Right lower leg: No edema.     Left lower leg: No edema.  Neurological:     General: No focal deficit present.     Mental Status: He is alert and oriented to person, place, and time.  Psychiatric:        Mood and Affect: Mood normal.        Behavior: Behavior normal.     LABORATORY DATA:  I have reviewed the labs as  listed.  CBC Latest Ref Rng & Units 04/02/2020 02/23/2020 01/19/2020  WBC 4.0 - 10.5 K/uL 8.9 5.0 4.0  Hemoglobin 13.0 - 17.0 g/dL 14.8 14.0 12.0(L)  Hematocrit 39.0 - 52.0 % 45.0 42.5 37.0(L)  Platelets 150 - 400 K/uL 141(L)  104(L) 109(L)   CMP Latest Ref Rng & Units 04/02/2020 02/23/2020 01/19/2020  Glucose 70 - 99 mg/dL 84 92 103(H)  BUN 8 - 23 mg/dL _0 Creatinine 0.61 - 1.24 mg/dL 0.81 0.75 0.59(L)  Sodium 135 - 145 mmol/L 142 139 135  Potassium 3.5 - 5.1 mmol/L 3.9 3.9 4.1  Chloride 98 - 111 mmol/L 105 103 104  CO2 22 - 32 mmol/L _1 Calcium 8.9 - 10.3 mg/dL 8.9 8.8(L) 8.5(L)  Total Protein 6.5 - 8.1 g/dL 5.3(L) 5.3(L) 4.8(L)  Total Bilirubin 0.3 - 1.2 mg/dL 0.6 0.3 0.6  Alkaline Phos 38 - 126 U/L 21(L) 21(L) 24(L)  AST 15 - 41 U/L 18 14(L) 14(L)  ALT 0 - 44 U/L _2 DIAGNOSTIC IMAGING:  I have independently reviewed the scans and discussed with the patient. No results found.   ASSESSMENT:  1.Recurrent IgG kappa multiple myeloma: -RVD from 04/07/2014 through 07/28/2014 followed by maintenance Revlimid, which was discontinued. -MRI of the brain on 02/09/2017 showed right temporal lesion, status post radiation, PET/CT on January 2019 showed improvement in multiple myeloma with no new lesions. -Velcade changed from every other week on 05/06/2017 secondary to neuropathy. -MRI of the thoracic and lumbar spine on 09/03/2017 showed new rib lesion close to the spine on the left side, possibly eighth rib. There was soft tissue mass in that area. There is chronic L4 compression fracture which is stable. -Status post XRT to the left eighth rib lesion, 30 Gray in 10 fractions from 827 2019-9 12/2017 -DPD regimen started on 09/17/2017, developed erythematous maculopapular rash involving the trunk after taking 3 pills of pomalidomide. -Daratumumab restarted after radiation on 10/28/2017, Pomalyst completely discontinued on 02/12/2018 for recurrent rash. -Subcu daratumumab on 10/25/2018. He takes dexamethasone 10 mg on days of treatment. -On monthly daratumumab and dexamethasone. -Revlimid 10 mg 3 weeks on 1 week offwas supposed to bestarted on 11/24/2019.  Revlimid discontinued after first cycle in  December secondary to rash.   PLAN:  1. IgG kappa myeloma: -He could not tolerate Revlimid or Pomalyst due to rash. -He is tolerating single agent Darzalex very well. -Reviewed myeloma panel from 01/19/2020 which showed M spike 0.1 g.  Free light chain ratio improved to 2.6.  Myeloma panel from today is pending. -Reviewed his routine labs which shows normal CBC with platelet count improved to 141.  LFTs are normal. -Proceed with Darzalex today.  RTC 4 weeks for follow-up.  2. Low back pain: -Continue oxycodone as needed.  He has not required any in the last 1 and a week.  3. Peripheral neuropathy: -Continue gabapentin twice daily.  4. Bone protection: -Continue Zometa every 8 weeks.  5. Sleeping difficulty: -Continue with trazodone 300 mg and Xanax 2 tablets which seem to work for him.  6. Hypokalemia: -Continue home potassium supplements.  Potassium today is 3.9.   Orders placed this encounter:  Orders Placed This Encounter  Procedures  . Protein electrophoresis, serum  . Kappa/lambda light chains     Dean Jack, MD [place] [phone]   I, Dean [name], am acting as a scribe for Dr. Sanda Linger.  Kinnie Scales MD, have reviewed the above  documentation for accuracy and completeness, and I agree with the above.

## 2020-04-02 NOTE — Patient Instructions (Signed)
[facility] at [facility] Discharge Instructions  You were seen today by Dr. Katragadda. He went over your recent results. You received your treatment today. Dr. Katragadda will see you back in 1 month for labs and follow up.   Thank you for choosing [facility] at [facility] to provide your oncology and hematology care.  To afford each patient quality time with our provider, please arrive at least 15 minutes before your scheduled appointment time.   If you have a lab appointment with the Cancer Center please come in thru the Main Entrance and check in at the main information desk  You need to re-schedule your appointment should you arrive 10 or more minutes late.  We strive to give you quality time with our providers, and arriving late affects you and other patients whose appointments are after yours.  Also, if you no show three or more times for appointments you may be dismissed from the clinic at the providers discretion.     Again, thank you for choosing [facility].  Our hope is that these requests will decrease the amount of time that you wait before being seen by our physicians.       _____________________________________________________________  Should you have questions after your visit to [facility], please contact our office at [phone] between the hours of 8:00 a.m. and 4:30 p.m.  Voicemails left after 4:00 p.m. will not be returned until the following business day.  For prescription refill requests, have your pharmacy contact our office and allow 72 hours.    Cancer Center Support Programs:   > Cancer Support Group  2nd Tuesday of the month 1pm-2pm, Journey Room    

## 2020-04-02 NOTE — Progress Notes (Signed)
Patient was assessed by Dr. Katragadda and labs have been reviewed.  Patient is okay to proceed with treatment today. Primary RN and pharmacy aware.   

## 2020-04-02 NOTE — Patient Instructions (Signed)
[facility] Discharge Instructions for Patients Receiving Chemotherapy  Today you received the following chemotherapy agents   To help prevent nausea and vomiting after your treatment, we encourage you to take your nausea medication   If you develop nausea and vomiting that is not controlled by your nausea medication, call the clinic.   BELOW ARE SYMPTOMS THAT SHOULD BE REPORTED IMMEDIATELY:  *FEVER GREATER THAN 100.5 F  *CHILLS WITH OR WITHOUT FEVER  NAUSEA AND VOMITING THAT IS NOT CONTROLLED WITH YOUR NAUSEA MEDICATION  *UNUSUAL SHORTNESS OF BREATH  *UNUSUAL BRUISING OR BLEEDING  TENDERNESS IN MOUTH AND THROAT WITH OR WITHOUT PRESENCE OF ULCERS  *URINARY PROBLEMS  *BOWEL PROBLEMS  UNUSUAL RASH Items with * indicate a potential emergency and should be followed up as soon as possible.  Feel free to call the clinic should you have any questions or concerns. The clinic phone number is [phone].  Please show the CHEMO ALERT CARD at check-in to the Emergency Department and triage nurse.   

## 2020-04-03 LAB — KAPPA/LAMBDA LIGHT CHAINS
Kappa free light chain: 19.7 mg/L — ABNORMAL HIGH (ref 3.3–19.4)
Kappa, lambda light chain ratio: 5.32 — ABNORMAL HIGH (ref 0.26–1.65)
Lambda free light chains: 3.7 mg/L — ABNORMAL LOW (ref 5.7–26.3)

## 2020-04-04 LAB — PROTEIN ELECTROPHORESIS, SERUM
A/G Ratio: 1.7 (ref 0.7–1.7)
Albumin ELP: 3.2 g/dL (ref 2.9–4.4)
Alpha-1-Globulin: 0.2 g/dL (ref 0.0–0.4)
Alpha-2-Globulin: 0.7 g/dL (ref 0.4–1.0)
Beta Globulin: 0.7 g/dL (ref 0.7–1.3)
Gamma Globulin: 0.2 g/dL — ABNORMAL LOW (ref 0.4–1.8)
Globulin, Total: 1.9 g/dL — ABNORMAL LOW (ref 2.2–3.9)
M-Spike, %: 0.1 g/dL — ABNORMAL HIGH
Total Protein ELP: 5.1 g/dL — ABNORMAL LOW (ref 6.0–8.5)

## 2020-04-27 ENCOUNTER — Other Ambulatory Visit (HOSPITAL_COMMUNITY): Payer: Self-pay | Admitting: Hematology and Oncology

## 2020-04-30 ENCOUNTER — Encounter: Payer: Self-pay | Admitting: Nurse Practitioner

## 2020-04-30 ENCOUNTER — Inpatient Hospital Stay (HOSPITAL_COMMUNITY): Payer: Medicare Other | Attending: Hematology and Oncology

## 2020-04-30 ENCOUNTER — Other Ambulatory Visit: Payer: Self-pay

## 2020-04-30 ENCOUNTER — Ambulatory Visit (INDEPENDENT_AMBULATORY_CARE_PROVIDER_SITE_OTHER): Payer: Medicare Other | Admitting: Nurse Practitioner

## 2020-04-30 ENCOUNTER — Inpatient Hospital Stay (HOSPITAL_BASED_OUTPATIENT_CLINIC_OR_DEPARTMENT_OTHER): Payer: Medicare Other | Admitting: Hematology

## 2020-04-30 ENCOUNTER — Inpatient Hospital Stay (HOSPITAL_COMMUNITY): Payer: Medicare Other

## 2020-04-30 VITALS — BP 116/72 | HR 74 | Temp 97.5°F | Ht 68.0 in | Wt 193.0 lb

## 2020-04-30 VITALS — BP 158/73 | HR 86 | Temp 97.2°F | Resp 18 | Wt 193.8 lb

## 2020-04-30 VITALS — BP 149/73 | HR 76 | Temp 97.6°F | Resp 18

## 2020-04-30 DIAGNOSIS — H6993 Unspecified Eustachian tube disorder, bilateral: Secondary | ICD-10-CM

## 2020-04-30 DIAGNOSIS — R300 Dysuria: Secondary | ICD-10-CM

## 2020-04-30 DIAGNOSIS — Z79899 Other long term (current) drug therapy: Secondary | ICD-10-CM | POA: Diagnosis not present

## 2020-04-30 DIAGNOSIS — C9000 Multiple myeloma not having achieved remission: Secondary | ICD-10-CM | POA: Diagnosis not present

## 2020-04-30 DIAGNOSIS — Z5112 Encounter for antineoplastic immunotherapy: Secondary | ICD-10-CM | POA: Diagnosis not present

## 2020-04-30 DIAGNOSIS — H6983 Other specified disorders of Eustachian tube, bilateral: Secondary | ICD-10-CM

## 2020-04-30 LAB — URINALYSIS, ROUTINE W REFLEX MICROSCOPIC
Bilirubin Urine: NEGATIVE
Glucose, UA: NEGATIVE
Hyaline Cast: NONE SEEN /LPF
Leukocytes,Ua: NEGATIVE
Nitrite: NEGATIVE
Protein, ur: NEGATIVE
Specific Gravity, Urine: 1.02 (ref 1.001–1.03)
Squamous Epithelial / HPF: NONE SEEN /HPF (ref <=5)
pH: 7.5 (ref 5.0–8.0)

## 2020-04-30 LAB — COMPREHENSIVE METABOLIC PANEL
ALT: 11 U/L (ref 0–44)
AST: 17 U/L (ref 15–41)
Albumin: 3.9 g/dL (ref 3.5–5.0)
Alkaline Phosphatase: 21 U/L — ABNORMAL LOW (ref 38–126)
Anion gap: 10 (ref 5–15)
BUN: 21 mg/dL (ref 8–23)
CO2: 26 mmol/L (ref 22–32)
Calcium: 8.8 mg/dL — ABNORMAL LOW (ref 8.9–10.3)
Chloride: 104 mmol/L (ref 98–111)
Creatinine, Ser: 0.79 mg/dL (ref 0.61–1.24)
GFR, Estimated: >60 mL/min (ref >60)
Glucose, Bld: 94 mg/dL (ref 70–99)
Potassium: 3.7 mmol/L (ref 3.5–5.1)
Sodium: 140 mmol/L (ref 135–145)
Total Bilirubin: 0.7 mg/dL (ref 0.3–1.2)
Total Protein: 6 g/dL — ABNORMAL LOW (ref 6.5–8.1)

## 2020-04-30 LAB — CBC WITH DIFFERENTIAL/PLATELET
Abs Immature Granulocytes: 0.01 10*3/uL (ref 0.00–0.07)
Basophils Absolute: 0 10*3/uL (ref 0.0–0.1)
Basophils Relative: 0 %
Eosinophils Absolute: 0 10*3/uL (ref 0.0–0.5)
Eosinophils Relative: 0 %
HCT: 44.8 % (ref 39.0–52.0)
Hemoglobin: 14.8 g/dL (ref 13.0–17.0)
Immature Granulocytes: 0 %
Lymphocytes Relative: 33 %
Lymphs Abs: 1.9 10*3/uL (ref 0.7–4.0)
MCH: 31.4 pg (ref 26.0–34.0)
MCHC: 33 g/dL (ref 30.0–36.0)
MCV: 95.1 fL (ref 80.0–100.0)
Monocytes Absolute: 0.6 10*3/uL (ref 0.1–1.0)
Monocytes Relative: 10 %
Neutro Abs: 3.3 10*3/uL (ref 1.7–7.7)
Neutrophils Relative %: 57 %
Platelets: 115 10*3/uL — ABNORMAL LOW (ref 150–400)
RBC: 4.71 MIL/uL (ref 4.22–5.81)
RDW: 14 % (ref 11.5–15.5)
WBC: 5.7 10*3/uL (ref 4.0–10.5)
nRBC: 0 % (ref 0.0–0.2)

## 2020-04-30 LAB — MAGNESIUM: Magnesium: 2 mg/dL (ref 1.7–2.4)

## 2020-04-30 LAB — MICROSCOPIC MESSAGE

## 2020-04-30 MED ORDER — HEPARIN SOD (PORK) LOCK FLUSH 100 UNIT/ML IV SOLN
500.0000 [IU] | Freq: Once | INTRAVENOUS | Status: AC | PRN
  Administered 2020-04-30: 500 [IU]

## 2020-04-30 MED ORDER — DARATUMUMAB-HYALURONIDASE-FIHJ 1800-30000 MG-UT/15ML ~~LOC~~ SOLN
1800.0000 mg | Freq: Once | SUBCUTANEOUS | Status: AC
  Administered 2020-04-30: 1800 mg via SUBCUTANEOUS
  Filled 2020-04-30: qty 15

## 2020-04-30 MED ORDER — ACETAMINOPHEN 325 MG PO TABS
ORAL_TABLET | ORAL | Status: AC
  Filled 2020-04-30: qty 2

## 2020-04-30 MED ORDER — SODIUM CHLORIDE 0.9% FLUSH
10.0000 mL | INTRAVENOUS | Status: DC | PRN
  Administered 2020-04-30: 10 mL

## 2020-04-30 MED ORDER — ACETAMINOPHEN 325 MG PO TABS
650.0000 mg | ORAL_TABLET | Freq: Once | ORAL | Status: AC
  Administered 2020-04-30: 650 mg via ORAL

## 2020-04-30 MED ORDER — PROCHLORPERAZINE MALEATE 10 MG PO TABS
10.0000 mg | ORAL_TABLET | Freq: Once | ORAL | Status: AC
Start: 2020-04-30 — End: 2020-04-30
  Administered 2020-04-30: 10 mg via ORAL

## 2020-04-30 MED ORDER — SODIUM CHLORIDE 0.9 % IV SOLN: Freq: Once | INTRAVENOUS | Status: DC

## 2020-04-30 MED ORDER — DEXAMETHASONE 4 MG PO TABS
20.0000 mg | ORAL_TABLET | Freq: Once | ORAL | Status: AC
  Administered 2020-04-30: 20 mg via ORAL

## 2020-04-30 MED ORDER — CIPROFLOXACIN HCL 500 MG PO TABS: 500.0000 mg | ORAL_TABLET | Freq: Two times a day (BID) | ORAL | 0 refills | Status: DC

## 2020-04-30 MED ORDER — PROCHLORPERAZINE MALEATE 10 MG PO TABS
ORAL_TABLET | ORAL | Status: AC
  Filled 2020-04-30: qty 1

## 2020-04-30 MED ORDER — ACYCLOVIR 400 MG PO TABS: 400.0000 mg | ORAL_TABLET | Freq: Two times a day (BID) | ORAL | 6 refills | Status: DC

## 2020-04-30 MED ORDER — DEXAMETHASONE 4 MG PO TABS
ORAL_TABLET | ORAL | Status: AC
  Filled 2020-04-30: qty 5

## 2020-04-30 MED ORDER — DIPHENHYDRAMINE HCL 25 MG PO CAPS
50.0000 mg | ORAL_CAPSULE | Freq: Once | ORAL | Status: AC
  Administered 2020-04-30: 50 mg via ORAL

## 2020-04-30 MED ORDER — DIPHENHYDRAMINE HCL 25 MG PO CAPS
ORAL_CAPSULE | ORAL | Status: AC
  Filled 2020-04-30: qty 2

## 2020-04-30 NOTE — Progress Notes (Signed)
Patient presents today for Daratumumab injection.  Vital signs and labs within parameters for treatment.  Message received from [facility] LPN/Dr [place] patient okay for treatment.  Daratumuab injection given today per MD orders.  Stable during injection without adverse affects. Injection site WNL.  Vital signs stable.  No complaints at this time.  Discharge from clinic via wheelchair in stable condition.  Alert and oriented X 3.  Follow up with [employer] [employer] as scheduled.

## 2020-04-30 NOTE — Progress Notes (Signed)
Subjective:    Patient ID: Dean Danker., male    DOB: 1935-07-29, 85 y.o.   MRN: [id number]  HPI: Dean Mcnerney. is a 85 y.o. male presenting with spouse for ear problem and burning with urination.  Chief Complaint  Patient presents with  . Ear Problem    Feels thumping in both ears for a while now, using nothing for the discomfort  . Dysuria    Burning while urination for a yr   RINGING IN EARS Duration: weeks Involved ear(s): right ear feels like a drum, left ear feels like buzzing like a bee Severity: moderate Quality:  Buzzing and beating like a drum Fever: no Otorrhea: no Upper respiratory infection symptoms: allergy symptoms Pruritus: no Hearing loss: yes Water immersion: yes; worse in shower Using Q-tips: no Recurrent otitis media: no Status: stable Treatments attempted: nothing tried  URINARY SYMPTOMS Duration: 1 month Dysuria: burning Urinary frequency: yes; especially at night  Urgency: yes Small volume voids: yes Symptom severity: severe; was up 7 times last night Urinary incontinence: no Foul odor: yes Hematuria: no Abdominal pain: no Back pain: no Suprapubic pain/pressure: no Flank pain: no Fever:  No Nausea: no Vomiting: no Relief with cranberry juice: no Relief with pyridium: no Status: worse Previous urinary tract infection: yes; previously had prostatitis Recurrent urinary tract infection: no Penile discharge: no Treatments attempted: nothing Water intake: "not enough" - drinks over 1 qt per day.    Allergies  Allergen Reactions  . Codeine Other (See Comments)    Headache  . Morphine And Related Other (See Comments)    Makes him feel weird  . Revlimid [Lenalidomide] Other (See Comments)    "Causes me to become weak"    Outpatient Encounter Medications as of 04/30/2020  Medication Sig  . acyclovir (ZOVIRAX) 400 MG tablet Take 1 tablet (400 mg total) by mouth 2 (two) times daily.  Marland Kitchen ALPRAZolam (XANAX) 1 MG tablet Take 1  mg by mouth 3 (three) times daily as needed.  Marland Kitchen CALCIUM-VITAMIN D PO Take 1 tablet by mouth 2 (two) times daily.   . ciprofloxacin (CIPRO) 500 MG tablet Take 1 tablet (500 mg total) by mouth 2 (two) times daily for 28 days.  Marland Kitchen dexamethasone (DECADRON) 4 MG tablet   . diazepam (VALIUM) 10 MG tablet TAKE 1 TABLET BY MOUTH DAILY AS NEEDED FOR SLEEP. STOP XANAX AND TRAZODONE  . fluticasone (FLONASE) 50 MCG/ACT nasal spray Place 2 sprays into both nostrils daily.  . furosemide (LASIX) 20 MG tablet TAKE 1 TABLET BY MOUTH ONCE A DAY AS NEEDED  . gabapentin (NEURONTIN) 400 MG capsule Take 1 capsule (400 mg total) by mouth 3 (three) times daily.  Marland Kitchen guaiFENesin (MUCINEX) 600 MG 12 hr tablet Take 1 tablet (600 mg total) by mouth 2 (two) times daily as needed for cough or to loosen phlegm.  . Homeopathic Products ([place]) Apply topically as needed.  Marland Kitchen lenalidomide (REVLIMID) 10 MG capsule Take 1 capsule (10 mg total) by mouth daily. [employer] Auth # [id number] Date Obtained 12/26/2019  . lidocaine-prilocaine (EMLA) cream Apply small amount over port one (1) hour prior to appointment.  . montelukast (SINGULAIR) 10 MG tablet TAKE 1 TABLET BY MOUTH EVERY NIGHT AT BEDTIME  . omeprazole (PRILOSEC) 20 MG capsule TAKE 1 CAPSULE BY MOUTH ONCE DAILY  . Oxycodone HCl 10 MG TABS TAKE 1 TABLET BY MOUTH EVERY 8 HOURS AS NEEDED FOR PAIN.  Marland Kitchen senna-docusate (SENOKOT-S) 8.6-50 MG tablet Take  1 tablet by mouth 2 (two) times daily.  . tamsulosin (FLOMAX) 0.4 MG CAPS capsule TAKE ONE CAPSULE BY MOUTH EVERY NIGHT ATBEDTIME  . traZODone (DESYREL) 100 MG tablet Take 3 tablets (300 mg total) by mouth at bedtime. TAKE 1 TABLET ( 100MG TOTAL ) BY MOUTH AT BEDTIME.  Marland Kitchen zolendronic acid (ZOMETA) 4 MG/5ML injection Inject 4 mg into the vein every 30 (thirty) days.   . [DISCONTINUED] 0.9 %  sodium chloride infusion   . [DISCONTINUED] sodium chloride flush (NS) 0.9 % injection 10 mL    No facility-administered encounter  medications on file as of 04/30/2020.    Patient Active Problem List   Diagnosis Date Noted  . Spinal stenosis, lumbar 10/13/2016  . Chronic back pain 10/01/2016  . DDD (degenerative disc disease), lumbar 10/01/2016  . OA (osteoarthritis) of knee 10/01/2016  . Diverticulitis s/p robotic sigmoid colectomy 07/11/2015 07/11/2015  . Goals of care, counseling/discussion   . Abnormal CT scan, sigmoid colon 04/02/2015  . Abnormal PET scan of colon 04/02/2015  . Thrombocytopenia ([place]) 01/09/2015  . Multiple myeloma without remission ([place]) 02/15/2014  . Allergic rhinitis 01/16/2014  . History of vertebral stress fracture 01/16/2014  . Dermatitis 01/16/2014  . Loss of weight 09/05/2013  . Insomnia 09/05/2013  . Fatigue 05/16/2013  . Neck pain 12/07/2012  . Skull lesion 11/29/2012  . Neuropathy ([place])   . BPH (benign prostatic hyperplasia)   . GERD (gastroesophageal reflux disease)   . H/O ETOH abuse     Past Medical History:  Diagnosis Date  . BPH (benign prostatic hyperplasia)   . Colonic diverticular abscess 01/08/2015  . Diverticulitis 24/58/09   complicated by abscess and required percutaneous drainage  . GERD (gastroesophageal reflux disease)   . H/O ETOH abuse   . History of chemotherapy last done jan 2017  . History of radiation therapy 01/05/13-02/10/13   45 gray to left occipital condyle region  . History of radiation therapy 12/01/16-12/10/16   Parasternal nodule, chest- 24 Gy total delivered in 8 fractions, Left sacro-iliac, pelvis- 24 Gy total delivered in 8 fractions   . History of radiation therapy 02/19/17-02/22/17   right temporal scalp 30 Gy in 10 fractions  . Intra-abdominal abscess ([place])   . Multiple myeloma ([place]) 2015  . Neuropathy   . Radiation 02/21/14-03/08/14   right posterior chest wall area 30 gray  . Radiation 04/19/14-05/02/14   lumbar spine 25 gray  . Skull lesion    Left occipital condyle  . Wrist fracture, left    x 2    Relevant past medical,  surgical, family and social history reviewed and updated as indicated. Interim medical history since our last visit reviewed.  Review of Systems  Constitutional: Negative for fever.  HENT: Positive for congestion, hearing loss, rhinorrhea, sore throat and tinnitus. Negative for ear discharge, ear pain, mouth sores, postnasal drip, sinus pressure, sinus pain, sneezing and trouble swallowing.     Per HPI unless specifically indicated above     Objective:    BP 116/72   Pulse 74   Temp (!) 97.5 F (36.4 C)   Ht 5' 8"  (1.727 m)   Wt 193 lb (87.5 kg)   BMI 29.35 kg/m   Wt Readings from Last 3 Encounters:  04/30/20 193 lb (87.5 kg)  04/30/20 193 lb 12.8 oz (87.9 kg)  04/02/20 196 lb 6.4 oz (89.1 kg)    Physical Exam Vitals and nursing note reviewed.  Constitutional:      General: He is  not in acute distress.    Appearance: Normal appearance. He is not toxic-appearing.  HENT:     Head: Normocephalic and atraumatic.     Right Ear: Ear canal and external ear normal. A middle ear effusion is present. There is no impacted cerumen.     Left Ear: Ear canal and external ear normal. A middle ear effusion is present. There is no impacted cerumen.     Ears:     Comments: Bilateral OME without evidence of infection.    Nose: Nose normal. No congestion.     Mouth/Throat:     Mouth: Mucous membranes are moist.     Pharynx: Oropharynx is clear. No posterior oropharyngeal erythema.  Eyes:     General: No scleral icterus.    Extraocular Movements: Extraocular movements intact.  Cardiovascular:     Rate and Rhythm: Normal rate.  Pulmonary:     Effort: Pulmonary effort is normal. No respiratory distress.     Breath sounds: Normal breath sounds. No wheezing, rhonchi or rales.  Abdominal:     General: Abdomen is flat. Bowel sounds are normal.     Palpations: Abdomen is soft.  Genitourinary:    Comments: Deferred using shared decision making Skin:    General: Skin is warm and dry.      Capillary Refill: Capillary refill takes less than 2 seconds.     Coloration: Skin is not jaundiced or pale.     Findings: No erythema.  Neurological:     Mental Status: He is alert and oriented to person, place, and time.     Motor: No weakness.     Gait: Gait normal.  Psychiatric:        Mood and Affect: Mood normal.        Behavior: Behavior normal.        Thought Content: Thought content normal.        Judgment: Judgment normal.     Results for orders placed or performed in visit on 04/30/20  Urinalysis, Routine w reflex microscopic  Result Value Ref Range   Color, Urine YELLOW YELLOW   APPearance CLEAR CLEAR   Specific Gravity, Urine 1.020 1.001 - 1.03   pH 7.5 5.0 - 8.0   Glucose, UA NEGATIVE NEGATIVE   Bilirubin Urine NEGATIVE NEGATIVE   Ketones, ur 1+ (A) NEGATIVE   Hgb urine dipstick TRACE (A) NEGATIVE   Protein, ur NEGATIVE NEGATIVE   Nitrite NEGATIVE NEGATIVE   Leukocytes,Ua NEGATIVE NEGATIVE   WBC, UA 0-5 0 - 5 /HPF   RBC / HPF 0-2 0 - 2 /HPF   Squamous Epithelial / LPF NONE SEEN < OR = 5 /HPF   Bacteria, UA FEW (A) NONE SEEN /HPF   Hyaline Cast NONE SEEN NONE SEEN /LPF  Microscopic Message  Result Value Ref Range   Note        Assessment & Plan:  1. Dysuria Acute on chronic.  Ua today showed trace amount of blood and moderate amount of bacteria.  Will send urine for culture.  Concern for prostatitis given history of same.  Last urine culture 07/2018 resistant to Bactrim, will avoid for now and start on Ciprofloxacin 500 mg bid for 4 weeks for presumed prostatitis.  Discussed side effects with patient and spouse at length - acute tendon rupture and C. Diff colitis.  Encouraged daily probiotic or yogurt to help minimize diarrhea.  Previously tolerated Cipro well in 2020, 2015, and 2016.  - Urinalysis, Routine w reflex microscopic -  Urine Culture - ciprofloxacin (CIPRO) 500 MG tablet; Take 1 tablet (500 mg total) by mouth 2 (two) times daily for 28 days.   Dispense: 56 tablet; Refill: 0  2. Dysfunction of both eustachian tubes Bilateral OME noted on examination today.  Encouraged regular use of flonase to help decrease inflammation.  If symptoms persist, consider referral to ENT for further management.  Likely would want to avoid oral steroids in this patient with multiple myeloma.    Follow up plan: Return if symptoms worsen or fail to improve.

## 2020-04-30 NOTE — Patient Instructions (Signed)
[place] at [facility] Discharge Instructions  You were seen today by Dr. Delton Coombes. He went over your recent results. You received your treatment today. Dr. Delton Coombes will see you back in 4 weeks for labs and follow up.   Thank you for choosing [place] at [facility] to provide your oncology and hematology care.  To afford each patient quality time with our provider, please arrive at least 15 minutes before your scheduled appointment time.   If you have a lab appointment with the [facility] please come in thru the Main Entrance and check in at the main information desk  You need to re-schedule your appointment should you arrive 10 or more minutes late.  We strive to give you quality time with our providers, and arriving late affects you and other patients whose appointments are after yours.  Also, if you no show three or more times for appointments you may be dismissed from the clinic at the providers discretion.     Again, thank you for choosing [facility].  Our hope is that these requests will decrease the amount of time that you wait before being seen by our physicians.       _____________________________________________________________  Should you have questions after your visit to [facility], please contact our office at [phone] between the hours of 8:00 a.m. and 4:30 p.m.  Voicemails left after 4:00 p.m. will not be returned until the following business day.  For prescription refill requests, have your pharmacy contact our office and allow 72 hours.    Cancer Center Support Programs:   > Cancer Support Group  2nd Tuesday of the month 1pm-2pm, Journey Room

## 2020-04-30 NOTE — Progress Notes (Signed)
Patient was assessed by Dr. Delton Coombes and labs have been reviewed.  Patient is okay to proceed with treatment today. Dr. Delton Coombes is wanting to add on Kappa/Lambda free light chains to labs today order released. Primary RN and pharmacy aware.

## 2020-04-30 NOTE — Progress Notes (Signed)
[place], [place] [address]   CLINIC:  Medical Oncology/Hematology  PCP:  [name], MD [address] [place] [address] [phone]   REASON FOR VISIT:  Follow-up for IgG kappa multiple myeloma  PRIOR THERAPY: Velcade x 6 cycles from 04/07/2014 to 07/28/2014; x 7 cycles from 02/18/2017 to 08/26/2017  NGS Results: Not done  CURRENT THERAPY: Darzalex Faspro every 4 weeks  BRIEF ONCOLOGIC HISTORY:  Oncology History  Multiple myeloma without remission ([place])  11/16/2012 Initial Diagnosis   L occipital condyle destructive lesion, not biopsied secondary to location. XRT   12/01/2012 Imaging   L3 superior endplate compression FX, no lytic lesions to suggest myeloma   02/06/2014 Imaging   soft tissue mass along the right posterior fifth rib with some rib destruction   02/16/2014 Miscellaneous   Normal CBC, Normal CMP, kappa,lamda ratio of 2.62 (abnormal), UIEP with slightly restricted band, IgG kappa, SPEP/IEP with monoclonal protein at 0.79 g/dl, normal igG, suppressed IGM   02/20/2014 Bone Marrow Biopsy   33% kappa restricted plasma cells Normal FISH, normal cytogenetics   02/21/2014 - 03/08/2014 Radiation Therapy   30Gy to R rib lesion, lesion not biopsied   03/16/2014 PET scan   Scattered hypermetabolic osseous lesions, including the calvarium, ribs, left scapula, sacrum, and left femoral shaft. An expansile left vertebral body lesion at L3 extends into the epidural space of the left lateral recess,    03/17/2014 - 04/07/2014 Chemotherapy   Velcade and Dexamethasone due to initial denial of Revlimid by insurance company.  Zometa monthly.   03/22/2014 Imaging   MR_ L-Spine- Enhancing lesions compatible with multiple myeloma at L2, L3, and S1-2.   04/07/2014 - 07/28/2014 Chemotherapy   RVD with Revlimid at 25 mg days 1-14.  Revlimid dose reduced to 25 mg every other day x 14 days with 7 day respite beginning on day 1 of  cycle 2.   04/17/2014 Adverse Reaction   Revlimid-induced rash.  Treated with steroids.   07/28/2014 - 08/04/2014 Chemotherapy   Revlimid daily   08/04/2014 Adverse Reaction   Velcade-induced peripheral neuropathy   08/04/2014 Treatment Plan Change   D/C Velcade   08/11/2014 PET scan   Response to therapy. Improvement and resolution of foci of osseous hypermetabolism.   08/22/2014 - 08/28/2014 Chemotherapy   Revlimid 25 mg days 1-21 every 28 days   08/28/2014 Adverse Reaction   Revlimid-induced rash. Medication held.  Medrol dose Pak prescribed.   09/04/2014 - 01/08/2015 Chemotherapy   Revlimid 10 mg every other day, without dexamethasone   01/08/2015 - 01/15/2015 Hospital Admission   Colonic diverticular abscess with IR drain placement   03/02/2015 PET scan   Only bony uptake is low activity at site of deformity/callus at R second rib FX, high activity in sigmoid colon, advanced sigmoid divertidulosis. Abscess not resolved?   06/04/2015 Imaging   CT nonobstructive L renal calculus, diverticulosis of descending and sigmoid colon without inflammation, moderate prostatic enlargement   07/12/2015 Surgery   Diverticulitis s/p robotic sigmoid colectomy with Dr. Johney Maine   08/23/2015 PET scan   Primarily similar hypermetabolic osseous foci within the R sided ribs. new L third rib focus of hypermetab and sclerosis is favored to be related to healing FX. no soft tissue myeloma id'd. Presumed postop hypermetab and edema about the R pelvic wall    03/06/2016 PET scan   1. Reduced activity in the prior lesion such as the right second and  fifth rib lesions, activity nearly completely resolved and significantly less than added mediastinal blood pool activity. No new lesions are identified. 2. Other imaging findings of potential clinical significance: Coronary, aortic arch, and branch vessel atherosclerotic vascular disease. Aortoiliac atherosclerotic vascular disease. Enlarged prostate gland. Colonic  diverticula.   02/17/2017 PET scan   HEAD/NECK: No hypermetabolic activity in the scalp. No hypermetabolic cervical lymph nodes. CHEST: No hypermetabolic mediastinal or hilar nodes. Right upper lobe scarring/atelectasis. No suspicious pulmonary nodules on the CT scan. ABDOMEN/PELVIS: No abnormal hypermetabolic activity within the liver, pancreas, adrenal glands, or spleen. No hypermetabolic lymph nodes in the abdomen or pelvis. Atherosclerotic calcifications of the abdominal aorta and branch vessels. Left renal sinus cysts. Sigmoid diverticulosis, without evidence of diverticulitis. Prostatomegaly. SKELETON: Vague hypermetabolism involving the inferior sternum, max SUV 3.2, previously 8.2. Focal hypermetabolism involving the left sacrum, max SUV 4.0, previously 6.7. EXTREMITIES: No abnormal hypermetabolic activity in the lower extremities.   02/18/2017 -  Chemotherapy   Bortezomib 1.$RemoveBefor'3mg'gSiyufcuexqt$ /m2 QWk + Dexamethasone $RemoveBeforeD'10mg'rukwvVKvsafVEg$  QTue/Fri --Cycle #1, 02/18/17    02/18/2017 - 08/26/2017 Chemotherapy   The patient had bortezomib SQ (VELCADE) chemo injection 3 mg, 1.3 mg/m2 = 3 mg, Subcutaneous,  Once, 7 of 9 cycles Administration: 3 mg (02/18/2017), 3 mg (02/26/2017), 3 mg (03/04/2017), 3 mg (03/11/2017), 3 mg (04/08/2017), 3 mg (03/18/2017), 3 mg (03/25/2017), 3 mg (04/01/2017), 3 mg (05/06/2017), 3 mg (05/20/2017), 3 mg (06/03/2017), 3 mg (06/17/2017), 3 mg (07/01/2017), 3 mg (07/15/2017), 3 mg (07/29/2017), 3 mg (08/12/2017), 3 mg (08/26/2017)  for chemotherapy treatment.    09/17/2017 -  Chemotherapy      Patient is on Antibody Plan: MYELOMA DARATUMUMAB/POMALIDOMIDE/DEXAMETHASONE Q28D X 7 CYCLES      CANCER STAGING: Cancer Staging No matching staging information was found for the patient.  INTERVAL HISTORY:  Mr. Koehn Salehi., a 85 y.o. male, returns for routine follow-up and consideration for next cycle of immunotherapy. Clydell was last seen on 04/02/2020.  Due for cycle #30 of Darzalex Faspro today.   Overall, he  tells me he has been feeling okay. He notes that he fell down last week after he hit his foot and stumbled while he was walking. He continues taking acyclovir BID. He notes having congestion and tinnitus in his ears which is usually what happens in the spring. He continues taking oxycodone 10 mg BID for his back pain. His appetite is excellent.  Overall, he feels ready for next cycle of immunotherapy today.    REVIEW OF SYSTEMS:  Review of Systems  Constitutional: Positive for fatigue (50%). Negative for appetite change.  HENT:   Positive for tinnitus (in bilat ears).   Cardiovascular: Negative for leg swelling.  Musculoskeletal: Positive for back pain (on oxycodone BID) and myalgias (3/10 generalized pain).  Psychiatric/Behavioral: Positive for sleep disturbance.  All other systems reviewed and are negative.   PAST MEDICAL/SURGICAL HISTORY:  Past Medical History:  Diagnosis Date  . BPH (benign prostatic hyperplasia)   . Colonic diverticular abscess 01/08/2015  . Diverticulitis 08/67/61   complicated by abscess and required percutaneous drainage  . GERD (gastroesophageal reflux disease)   . H/O ETOH abuse   . History of chemotherapy last done jan 2017  . History of radiation therapy 01/05/13-02/10/13   45 gray to left occipital condyle region  . History of radiation therapy 12/01/16-12/10/16   Parasternal nodule, chest- 24 Gy total delivered in 8 fractions, Left sacro-iliac, pelvis- 24 Gy total delivered in 8 fractions   . History of radiation  therapy 02/19/17-02/22/17   right temporal scalp 30 Gy in 10 fractions  . Intra-abdominal abscess ([place])   . Multiple myeloma ([place]) 2015  . Neuropathy   . Radiation 02/21/14-03/08/14   right posterior chest wall area 30 gray  . Radiation 04/19/14-05/02/14   lumbar spine 25 gray  . Skull lesion    Left occipital condyle  . Wrist fracture, left    x 2   Past Surgical History:  Procedure Laterality Date  . COLONOSCOPY N/A 04/19/2015   Procedure:  COLONOSCOPY;  Surgeon: Danie Binder, MD;  Location: [facility];  Service: Endoscopy;  Laterality: N/A;  1:30 PM  . CYST EXCISION  1959   tail bone  . IR IMAGING GUIDED PORT INSERTION  09/25/2017    SOCIAL HISTORY:  Social History   Socioeconomic History  . Marital status: Married    Spouse name: Not on file  . Number of children: 3  . Years of education: Not on file  . Highest education level: Not on file  Occupational History    Employer: RETIRED  Tobacco Use  . Smoking status: Never Smoker  . Smokeless tobacco: Never Used  Vaping Use  . Vaping Use: Never used  Substance and Sexual Activity  . Alcohol use: No    Comment: " Not much no more"  . Drug use: No  . Sexual activity: Not on file  Other Topics Concern  . Not on file  Social History Narrative  . Not on file   Social Determinants of Health   Financial Resource Strain: Low Risk   . Difficulty of Paying Living Expenses: Not very hard  Food Insecurity: No Food Insecurity  . Worried About Charity fundraiser in the Last Year: Never true  . Ran Out of Food in the Last Year: Never true  Transportation Needs: No Transportation Needs  . Lack of Transportation (Medical): No  . Lack of Transportation (Non-Medical): No  Physical Activity: Inactive  . Days of Exercise per Week: 0 days  . Minutes of Exercise per Session: 0 min  Stress: No Stress Concern Present  . Feeling of Stress : Not at all  Social Connections: Moderately Isolated  . Frequency of Communication with Friends and Family: More than three times a week  . Frequency of Social Gatherings with Friends and Family: Once a week  . Attends Religious Services: Never  . Active Member of Clubs or Organizations: No  . Attends Archivist Meetings: Never  . Marital Status: Married  Human resources officer Violence: Not At Risk  . Fear of Current or Ex-Partner: No  . Emotionally Abused: No  . Physically Abused: No  . Sexually Abused: No    FAMILY  HISTORY:  Family History  Problem Relation Age of Onset  . Diabetes Mother   . Stroke Mother   . Hypertension Father     CURRENT MEDICATIONS:  Current Outpatient Medications  Medication Sig Dispense Refill  . ALPRAZolam (XANAX) 1 MG tablet Take 1 mg by mouth 3 (three) times daily as needed.    Marland Kitchen CALCIUM-VITAMIN D PO Take 1 tablet by mouth 2 (two) times daily.     Marland Kitchen dexamethasone (DECADRON) 4 MG tablet     . diazepam (VALIUM) 10 MG tablet TAKE 1 TABLET BY MOUTH DAILY AS NEEDED FOR SLEEP. STOP XANAX AND TRAZODONE 30 tablet 5  . fluticasone (FLONASE) 50 MCG/ACT nasal spray Place 2 sprays into both nostrils daily. 16 g 6  . furosemide (LASIX) 20 MG  tablet TAKE 1 TABLET BY MOUTH ONCE A DAY AS NEEDED 30 tablet 3  . gabapentin (NEURONTIN) 400 MG capsule Take 1 capsule (400 mg total) by mouth 3 (three) times daily. 90 capsule 4  . guaiFENesin (MUCINEX) 600 MG 12 hr tablet Take 1 tablet (600 mg total) by mouth 2 (two) times daily as needed for cough or to loosen phlegm. 30 tablet 0  . Homeopathic Products ([place]) Apply topically as needed.    Marland Kitchen lenalidomide (REVLIMID) 10 MG capsule Take 1 capsule (10 mg total) by mouth daily. [employer] Auth # [id number] Date Obtained 12/26/2019 21 capsule 0  . lidocaine-prilocaine (EMLA) cream Apply small amount over port one (1) hour prior to appointment. 30 g 0  . montelukast (SINGULAIR) 10 MG tablet TAKE 1 TABLET BY MOUTH EVERY NIGHT AT BEDTIME 30 tablet 3  . omeprazole (PRILOSEC) 20 MG capsule TAKE 1 CAPSULE BY MOUTH ONCE DAILY 30 capsule 5  . Oxycodone HCl 10 MG TABS TAKE 1 TABLET BY MOUTH EVERY 8 HOURS AS NEEDED FOR PAIN. 120 tablet 0  . senna-docusate (SENOKOT-S) 8.6-50 MG tablet Take 1 tablet by mouth 2 (two) times daily. 60 tablet 5  . tamsulosin (FLOMAX) 0.4 MG CAPS capsule TAKE ONE CAPSULE BY MOUTH EVERY NIGHT ATBEDTIME 30 capsule 11  . traZODone (DESYREL) 100 MG tablet Take 3 tablets (300 mg total) by mouth at bedtime. TAKE 1 TABLET ( 100MG   TOTAL ) BY MOUTH AT BEDTIME. 90 tablet 3  . zolendronic acid (ZOMETA) 4 MG/5ML injection Inject 4 mg into the vein every 30 (thirty) days.     Marland Kitchen acyclovir (ZOVIRAX) 400 MG tablet Take 1 tablet (400 mg total) by mouth 2 (two) times daily. 60 tablet 6   No current facility-administered medications for this visit.    ALLERGIES:  Allergies  Allergen Reactions  . Codeine Other (See Comments)    Headache  . Morphine And Related Other (See Comments)    Makes him feel weird  . Revlimid [Lenalidomide] Other (See Comments)    "Causes me to become weak"    PHYSICAL EXAM:  Performance status (ECOG): 1 - Symptomatic but completely ambulatory  Vitals:   04/30/20 0920  BP: (!) 158/73  Pulse: 86  Resp: 18  Temp: (!) 97.2 F (36.2 C)  SpO2: 98%   Wt Readings from Last 3 Encounters:  04/30/20 193 lb 12.8 oz (87.9 kg)  04/02/20 196 lb 6.4 oz (89.1 kg)  02/23/20 189 lb 8 oz (86 kg)   Physical Exam Vitals reviewed.  Constitutional:      Appearance: Normal appearance.  Cardiovascular:     Rate and Rhythm: Normal rate and regular rhythm.     Pulses: Normal pulses.     Heart sounds: Normal heart sounds.  Pulmonary:     Effort: Pulmonary effort is normal.     Breath sounds: Normal breath sounds.  Chest:     Comments: Port-a-Cath in R chest Musculoskeletal:     Right lower leg: No edema.     Left lower leg: No edema.  Neurological:     General: No focal deficit present.     Mental Status: He is alert and oriented to person, place, and time.  Psychiatric:        Mood and Affect: Mood normal.        Behavior: Behavior normal.     LABORATORY DATA:  I have reviewed the labs as listed.  CBC Latest Ref Rng & Units 04/30/2020 04/02/2020 02/23/2020  WBC 4.0 - 10.5 K/uL 5.7 8.9 5.0  Hemoglobin 13.0 - 17.0 g/dL 14.8 14.8 14.0  Hematocrit 39.0 - 52.0 % 44.8 45.0 42.5  Platelets 150 - 400 K/uL 115(L) 141(L) 104(L)   CMP Latest Ref Rng & Units 04/30/2020 04/02/2020 02/23/2020  Glucose 70 - 99  mg/dL 94 84 92  BUN 8 - 23 mg/dL $Remove'21 23 21  'UyXWPIf$ Creatinine 0.61 - 1.24 mg/dL 0.79 0.81 0.75  Sodium 135 - 145 mmol/L 140 142 139  Potassium 3.5 - 5.1 mmol/L 3.7 3.9 3.9  Chloride 98 - 111 mmol/L 104 105 103  CO2 22 - 32 mmol/L $RemoveB'26 25 28  'KVcXVGdS$ Calcium 8.9 - 10.3 mg/dL 8.8(L) 8.9 8.8(L)  Total Protein 6.5 - 8.1 g/dL 6.0(L) 5.3(L) 5.3(L)  Total Bilirubin 0.3 - 1.2 mg/dL 0.7 0.6 0.3  Alkaline Phos 38 - 126 U/L 21(L) 21(L) 21(L)  AST 15 - 41 U/L 17 18 14(L)  ALT 0 - 44 U/L $Remo'11 21 13    'FtpGV$ DIAGNOSTIC IMAGING:  I have independently reviewed the scans and discussed with the patient. No results found.   ASSESSMENT:  1.Recurrent IgG kappa multiple myeloma: -RVD from 04/07/2014 through 07/28/2014 followed by maintenance Revlimid, which was discontinued. -MRI of the brain on 02/09/2017 showed right temporal lesion, status post radiation, PET/CT on January 2019 showed improvement in multiple myeloma with no new lesions. -Velcade changed from every other week on 05/06/2017 secondary to neuropathy. -MRI of the thoracic and lumbar spine on 09/03/2017 showed new rib lesion close to the spine on the left side, possibly eighth rib. There was soft tissue mass in that area. There is chronic L4 compression fracture which is stable. -Status post XRT to the left eighth rib lesion, 30 Gray in 10 fractions from 827 2019-9 12/2017 -DPD regimen started on 09/17/2017, developed erythematous maculopapular rash involving the trunk after taking 3 pills of pomalidomide. -Daratumumab restarted after radiation on 10/28/2017, Pomalyst completely discontinued on 02/12/2018 for recurrent rash. -Subcu daratumumab on 10/25/2018. He takes dexamethasone 10 mg on days of treatment. -On monthly daratumumab and dexamethasone. -Revlimid 10 mg 3 weeks on 1 week offwas supposed to bestarted on 11/24/2019.Revlimid discontinued after first cycle in December secondary to rash.   PLAN:  1. IgG kappa myeloma: -He could not tolerate Revlimid or  Pomalyst due to rash. -He is continuing Darzalex single agent very well. -Reviewed myeloma panel from 03/25/2020 which showed M spike 0.1 g.  Free light chain ratio has increased to 5.32 with kappa light chains of 19.7. -We will send another free light chain ratio today. -CBC and LFTs are normal.  Mild thrombocytopenia stable. -He will proceed with Darzalex today.  RTC 4 weeks for follow-up.  2. Low back pain: -Continue oxycodone 1 to 2 tablets/day as needed.  3. Peripheral neuropathy: -Continue gabapentin twice daily.  4. Bone protection: -Continue Zometa every 8 weeks.  5. Sleeping difficulty: -Continue trazodone 300 mg and Xanax 2 tablets at bedtime.  6. Hypokalemia: -Continue potassium.  Potassium today is 3.7.   Orders placed this encounter:  Orders Placed This Encounter  Procedures  . CBC with Differential/Platelet  . Comprehensive metabolic panel  . Magnesium  . Kappa/lambda light chains     Derek Jack, MD [place] [phone]   I, Milinda Antis, am acting as a scribe for Dr. Sanda Linger.  I, Derek Jack MD, have reviewed the above documentation for accuracy and completeness, and I agree with the above.

## 2020-04-30 NOTE — Patient Instructions (Signed)
Daratumumab injection What is this medicine? DARATUMUMAB (dar a toom ue mab) is a monoclonal antibody. It is used to treat multiple myeloma. This medicine may be used for other purposes; ask your health care provider or pharmacist if you have questions. COMMON BRAND NAME(S): DARZALEX What should I tell my health care provider before I take this medicine? They need to know if you have any of these conditions:  hereditary fructose intolerance  infection (especially a virus infection such as chickenpox, herpes, or hepatitis B virus)  lung or breathing disease (asthma, COPD)  an unusual or allergic reaction to daratumumab, sorbitol, other medicines, foods, dyes, or preservatives  pregnant or trying to get pregnant  breast-feeding How should I use this medicine? This medicine is for infusion into a vein. It is given by a health care professional in a hospital or clinic setting. Talk to your pediatrician regarding the use of this medicine in children. Special care may be needed. Overdosage: If you think you have taken too much of this medicine contact a poison control center or emergency room at once. NOTE: This medicine is only for you. Do not share this medicine with others. What if I miss a dose? Keep appointments for follow-up doses as directed. It is important not to miss your dose. Call your doctor or health care professional if you are unable to keep an appointment. What may interact with this medicine? Interactions have not been studied. This list may not describe all possible interactions. Give your health care provider a list of all the medicines, herbs, non-prescription drugs, or dietary supplements you use. Also tell them if you smoke, drink alcohol, or use illegal drugs. Some items may interact with your medicine. What should I watch for while using this medicine? Your condition will be monitored carefully while you are receiving this medicine. This medicine can cause serious  allergic reactions. To reduce your risk, your health care provider may give you other medicine to take before receiving this one. Be sure to follow the directions from your health care provider. This medicine can affect the results of blood tests to match your blood type. These changes can last for up to 6 months after the final dose. Your healthcare provider will do blood tests to match your blood type before you start treatment. Tell all of your healthcare providers that you are being treated with this medicine before receiving a blood transfusion. This medicine can affect the results of some tests used to determine treatment response; extra tests may be needed to evaluate response. Do not become pregnant while taking this medicine or for 3 months after stopping it. Women should inform their health care provider if they wish to become pregnant or think they might be pregnant. There is a potential for serious side effects to an unborn child. Talk to your health care provider for more information. Do not breast-feed an infant while taking this medicine. What side effects may I notice from receiving this medicine? Side effects that you should report to your doctor or health care professional as soon as possible:  allergic reactions (skin rash; itching or hives; swelling of the face, lips, or tongue)  infection (fever, chills, cough, sore throat, pain or difficulty passing urine)  infusion reaction (dizziness, fast heartbeat, feeling faint or lightheaded, falls, headache, increase in blood pressure, nausea, vomiting, or wheezing or trouble breathing with loud or whistling sounds)  unusual bleeding or bruising Side effects that usually do not require medical attention (report to your doctor  or health care professional if they continue or are bothersome):  constipation  diarrhea  pain, tingling, numbness in the hands or feet  swelling of the ankles, feet, hands  tiredness This list may not  describe all possible side effects. Call your doctor for medical advice about side effects. You may report side effects to FDA at 1-800-FDA-1088. Where should I keep my medicine? This drug is given in a hospital or clinic and will not be stored at home. NOTE: This sheet is a summary. It may not cover all possible information. If you have questions about this medicine, talk to your doctor, pharmacist, or health care provider.  2021 Elsevier/Gold Standard (2020-01-12 13:28:52)

## 2020-05-01 LAB — URINE CULTURE
MICRO NUMBER:: 11699987
SPECIMEN QUALITY:: ADEQUATE

## 2020-05-01 LAB — KAPPA/LAMBDA LIGHT CHAINS
Kappa free light chain: 25.3 mg/L — ABNORMAL HIGH (ref 3.3–19.4)
Kappa, lambda light chain ratio: 6.66 — ABNORMAL HIGH (ref 0.26–1.65)
Lambda free light chains: 3.8 mg/L — ABNORMAL LOW (ref 5.7–26.3)

## 2020-05-15 ENCOUNTER — Other Ambulatory Visit (HOSPITAL_COMMUNITY): Payer: Self-pay | Admitting: Hematology

## 2020-05-23 ENCOUNTER — Other Ambulatory Visit: Payer: Self-pay

## 2020-05-23 ENCOUNTER — Ambulatory Visit (HOSPITAL_COMMUNITY)
Admission: RE | Admit: 2020-05-23 | Discharge: 2020-05-23 | Disposition: A | Discharge status: Home / Self Care | Payer: Medicare Other | Source: Ambulatory Visit | Attending: Family Medicine | Admitting: Family Medicine

## 2020-05-23 ENCOUNTER — Ambulatory Visit (INDEPENDENT_AMBULATORY_CARE_PROVIDER_SITE_OTHER): Payer: Medicare Other | Admitting: Family Medicine

## 2020-05-23 ENCOUNTER — Encounter: Payer: Self-pay | Admitting: Family Medicine

## 2020-05-23 ENCOUNTER — Other Ambulatory Visit: Payer: Self-pay | Admitting: Family Medicine

## 2020-05-23 VITALS — BP 124/78 | HR 88 | Temp 98.3°F | Resp 14 | Ht 68.0 in | Wt 197.0 lb

## 2020-05-23 DIAGNOSIS — Y929 Unspecified place or not applicable: Secondary | ICD-10-CM | POA: Insufficient documentation

## 2020-05-23 DIAGNOSIS — M545 Low back pain, unspecified: Secondary | ICD-10-CM | POA: Diagnosis not present

## 2020-05-23 DIAGNOSIS — W19XXXA Unspecified fall, initial encounter: Secondary | ICD-10-CM | POA: Diagnosis not present

## 2020-05-23 DIAGNOSIS — S79912A Unspecified injury of left hip, initial encounter: Secondary | ICD-10-CM | POA: Diagnosis not present

## 2020-05-23 DIAGNOSIS — M25551 Pain in right hip: Secondary | ICD-10-CM

## 2020-05-23 DIAGNOSIS — M25552 Pain in left hip: Secondary | ICD-10-CM | POA: Insufficient documentation

## 2020-05-23 DIAGNOSIS — M4856XA Collapsed vertebra, not elsewhere classified, lumbar region, initial encounter for fracture: Secondary | ICD-10-CM | POA: Diagnosis not present

## 2020-05-23 DIAGNOSIS — S3219XA Other fracture of sacrum, initial encounter for closed fracture: Secondary | ICD-10-CM | POA: Diagnosis not present

## 2020-05-23 DIAGNOSIS — R296 Repeated falls: Secondary | ICD-10-CM | POA: Diagnosis not present

## 2020-05-23 DIAGNOSIS — Y939 Activity, unspecified: Secondary | ICD-10-CM | POA: Diagnosis not present

## 2020-05-23 DIAGNOSIS — M5136 Other intervertebral disc degeneration, lumbar region: Secondary | ICD-10-CM | POA: Diagnosis not present

## 2020-05-23 DIAGNOSIS — M47819 Spondylosis without myelopathy or radiculopathy, site unspecified: Secondary | ICD-10-CM | POA: Insufficient documentation

## 2020-05-23 DIAGNOSIS — S79911A Unspecified injury of right hip, initial encounter: Secondary | ICD-10-CM | POA: Diagnosis not present

## 2020-05-23 MED ORDER — TRIAMCINOLONE ACETONIDE 0.1 % EX CREA: 1.0000 "application " | TOPICAL_CREAM | Freq: Two times a day (BID) | CUTANEOUS | 0 refills | Status: DC

## 2020-05-23 NOTE — Progress Notes (Signed)
Subjective:    Patient ID: Dean Danker., male    DOB: 02/14/1935, 85 y.o.   MRN: [id number]  HPI Patient is a very pleasant 85 year old Caucasian male who presents today complaining of bilateral hip pain and low back pain.  About 2 weeks ago, he was getting out of bed.  He lost his balance and fell landing on the side of his left hip.  Since that time he has had pain and tenderness along the pelvic crest.  He also has pain and tenderness along the lower lumbar vertebrae L4-L5 as well as pain and tenderness along the sacrum.  He is now complaining of pain radiating in his right hip down into his right groin.  He does have a history of a subchondral fracture in the right hip however orthopedics is evaluated that and recommended against any surgical intervention.  He is also having some pain in his left hip.  Past medical history is complicated by his multiple myeloma.  Past Medical History:  Diagnosis Date  . BPH (benign prostatic hyperplasia)   . Colonic diverticular abscess 01/08/2015  . Diverticulitis 79/02/40   complicated by abscess and required percutaneous drainage  . GERD (gastroesophageal reflux disease)   . H/O ETOH abuse   . History of chemotherapy last done jan 2017  . History of radiation therapy 01/05/13-02/10/13   45 gray to left occipital condyle region  . History of radiation therapy 12/01/16-12/10/16   Parasternal nodule, chest- 24 Gy total delivered in 8 fractions, Left sacro-iliac, pelvis- 24 Gy total delivered in 8 fractions   . History of radiation therapy 02/19/17-02/22/17   right temporal scalp 30 Gy in 10 fractions  . Intra-abdominal abscess ([place])   . Multiple myeloma ([place]) 2015  . Neuropathy   . Radiation 02/21/14-03/08/14   right posterior chest wall area 30 gray  . Radiation 04/19/14-05/02/14   lumbar spine 25 gray  . Skull lesion    Left occipital condyle  . Wrist fracture, left    x 2   Past Surgical History:  Procedure Laterality Date  . COLONOSCOPY N/A  04/19/2015   Procedure: COLONOSCOPY;  Surgeon: Danie Binder, MD;  Location: [facility];  Service: Endoscopy;  Laterality: N/A;  1:30 PM  . CYST EXCISION  1959   tail bone  . IR IMAGING GUIDED PORT INSERTION  09/25/2017   Current Outpatient Medications on File Prior to Visit  Medication Sig Dispense Refill  . acyclovir (ZOVIRAX) 400 MG tablet Take 1 tablet (400 mg total) by mouth 2 (two) times daily. 60 tablet 6  . ALPRAZolam (XANAX) 1 MG tablet TAKE 1 TABLET BY MOUTH 3 TIMES A DAY AS NEEDED FOR ANXIETY. 60 tablet 5  . CALCIUM-VITAMIN D PO Take 1 tablet by mouth 2 (two) times daily.     Marland Kitchen dexamethasone (DECADRON) 4 MG tablet     . fluticasone (FLONASE) 50 MCG/ACT nasal spray Place 2 sprays into both nostrils daily. 16 g 6  . furosemide (LASIX) 20 MG tablet TAKE 1 TABLET BY MOUTH ONCE A DAY AS NEEDED 30 tablet 3  . gabapentin (NEURONTIN) 400 MG capsule Take 1 capsule (400 mg total) by mouth 3 (three) times daily. 90 capsule 4  . guaiFENesin (MUCINEX) 600 MG 12 hr tablet Take 1 tablet (600 mg total) by mouth 2 (two) times daily as needed for cough or to loosen phlegm. 30 tablet 0  . Homeopathic Products ([place]) Apply topically as needed.    . lidocaine-prilocaine (  EMLA) cream Apply small amount over port one (1) hour prior to appointment. 30 g 0  . montelukast (SINGULAIR) 10 MG tablet TAKE 1 TABLET BY MOUTH EVERY NIGHT AT BEDTIME 30 tablet 3  . omeprazole (PRILOSEC) 20 MG capsule TAKE 1 CAPSULE BY MOUTH ONCE DAILY 30 capsule 5  . Oxycodone HCl 10 MG TABS TAKE 1 TABLET BY MOUTH EVERY 8 HOURS AS NEEDED FOR PAIN. 120 tablet 0  . senna-docusate (SENOKOT-S) 8.6-50 MG tablet Take 1 tablet by mouth 2 (two) times daily. 60 tablet 5  . tamsulosin (FLOMAX) 0.4 MG CAPS capsule TAKE ONE CAPSULE BY MOUTH EVERY NIGHT ATBEDTIME 30 capsule 11  . traZODone (DESYREL) 100 MG tablet Take 3 tablets (300 mg total) by mouth at bedtime. TAKE 1 TABLET ( 100MG TOTAL ) BY MOUTH AT BEDTIME. 90 tablet 3  .  zolendronic acid (ZOMETA) 4 MG/5ML injection Inject 4 mg into the vein every 30 (thirty) days.      No current facility-administered medications on file prior to visit.   Allergies  Allergen Reactions  . Codeine Other (See Comments)    Headache  . Morphine And Related Other (See Comments)    Makes him feel weird  . Revlimid [Lenalidomide] Other (See Comments)    "Causes me to become weak"   Social History   Socioeconomic History  . Marital status: Married    Spouse name: Not on file  . Number of children: 3  . Years of education: Not on file  . Highest education level: Not on file  Occupational History    Employer: RETIRED  Tobacco Use  . Smoking status: Never Smoker  . Smokeless tobacco: Never Used  Vaping Use  . Vaping Use: Never used  Substance and Sexual Activity  . Alcohol use: No    Comment: " Not much no more"  . Drug use: No  . Sexual activity: Not on file  Other Topics Concern  . Not on file  Social History Narrative  . Not on file   Social Determinants of Health   Financial Resource Strain: Low Risk   . Difficulty of Paying Living Expenses: Not very hard  Food Insecurity: No Food Insecurity  . Worried About Charity fundraiser in the Last Year: Never true  . Ran Out of Food in the Last Year: Never true  Transportation Needs: No Transportation Needs  . Lack of Transportation (Medical): No  . Lack of Transportation (Non-Medical): No  Physical Activity: Inactive  . Days of Exercise per Week: 0 days  . Minutes of Exercise per Session: 0 min  Stress: No Stress Concern Present  . Feeling of Stress : Not at all  Social Connections: Moderately Isolated  . Frequency of Communication with Friends and Family: More than three times a week  . Frequency of Social Gatherings with Friends and Family: Once a week  . Attends Religious Services: Never  . Active Member of Clubs or Organizations: No  . Attends Archivist Meetings: Never  . Marital Status:  Married  Human resources officer Violence: Not At Risk  . Fear of Current or Ex-Partner: No  . Emotionally Abused: No  . Physically Abused: No  . Sexually Abused: No      Review of Systems     Objective:   Physical Exam Vitals reviewed.  Constitutional:      General: He is not in acute distress.    Appearance: He is obese. He is not ill-appearing or toxic-appearing.  Cardiovascular:  Rate and Rhythm: Normal rate and regular rhythm.     Heart sounds: Normal heart sounds.  Pulmonary:     Effort: Pulmonary effort is normal. No respiratory distress.     Breath sounds: Normal breath sounds. No wheezing, rhonchi or rales.  Musculoskeletal:     Lumbar back: Tenderness and bony tenderness present. Decreased range of motion.       Back:     Right hip: Tenderness and bony tenderness present. Decreased range of motion.     Left hip: Tenderness and bony tenderness present. Decreased range of motion.       Legs:  Neurological:     General: No focal deficit present.     Mental Status: He is alert and oriented to person, place, and time.     Cranial Nerves: No cranial nerve deficit.     Motor: No weakness.   Blue areas above show radiation of pain into the hip joints.  Circles represent areas of most significant pain in the hip joint.  Red circles in the back indicate areas where he is tender to palpation.        Assessment & Plan:  Fall, initial encounter - Plan: DG Hip Unilat W OR W/O Pelvis 2-3 Views Left, DG HIP UNILAT WITH PELVIS 2-3 VIEWS RIGHT, DG Lumbar Spine Complete  Hip pain, right - Plan: DG Hip Unilat W OR W/O Pelvis 2-3 Views Left, DG HIP UNILAT WITH PELVIS 2-3 VIEWS RIGHT, DG Lumbar Spine Complete  Hip pain, left - Plan: DG Hip Unilat W OR W/O Pelvis 2-3 Views Left, DG HIP UNILAT WITH PELVIS 2-3 VIEWS RIGHT, DG Lumbar Spine Complete  Begin by obtaining x-rays of the right hip, left hip, pelvis, and lumbar spine to evaluate for any fractures particular given his history  of multiple myeloma and numerous skeletal lesions.  Once we know the extent of his injuries, we can determine the best treatment strategy.  He is not using oxycodone or ibuprofen.  My plan will be to treat with a combination of ibuprofen and oxycodone if no significant fractures are found on x-ray

## 2020-05-24 ENCOUNTER — Other Ambulatory Visit: Payer: Self-pay | Admitting: *Deleted

## 2020-05-24 DIAGNOSIS — C9000 Multiple myeloma not having achieved remission: Secondary | ICD-10-CM

## 2020-05-24 MED ORDER — OXYCODONE HCL 10 MG PO TABS: 10.0000 mg | ORAL_TABLET | Freq: Three times a day (TID) | ORAL | 0 refills | Status: DC | PRN

## 2020-05-24 MED ORDER — IBUPROFEN 600 MG PO TABS: 600.0000 mg | ORAL_TABLET | Freq: Three times a day (TID) | ORAL | 0 refills | Status: DC | PRN

## 2020-05-24 NOTE — Addendum Note (Signed)
Addended by: Sheral Flow on: 05/24/2020 05:15 PM   Modules accepted: Orders

## 2020-05-24 NOTE — Telephone Encounter (Signed)
Received call from patient.   Reports that he requires refill on Oxycodone.  Also states that he discussed IBU use with provider.   Please advise.

## 2020-05-28 ENCOUNTER — Inpatient Hospital Stay (HOSPITAL_COMMUNITY): Payer: Medicare Other

## 2020-05-28 ENCOUNTER — Inpatient Hospital Stay (HOSPITAL_COMMUNITY): Payer: Medicare Other | Attending: Hematology and Oncology | Admitting: Hematology

## 2020-05-28 ENCOUNTER — Other Ambulatory Visit: Payer: Self-pay

## 2020-05-28 VITALS — BP 119/59 | HR 64 | Temp 96.9°F | Resp 17 | Wt 198.6 lb

## 2020-05-28 DIAGNOSIS — Z79899 Other long term (current) drug therapy: Secondary | ICD-10-CM | POA: Insufficient documentation

## 2020-05-28 DIAGNOSIS — Z5112 Encounter for antineoplastic immunotherapy: Secondary | ICD-10-CM | POA: Diagnosis not present

## 2020-05-28 DIAGNOSIS — C9000 Multiple myeloma not having achieved remission: Secondary | ICD-10-CM | POA: Diagnosis not present

## 2020-05-28 DIAGNOSIS — E876 Hypokalemia: Secondary | ICD-10-CM | POA: Diagnosis not present

## 2020-05-28 LAB — COMPREHENSIVE METABOLIC PANEL
ALT: 10 U/L (ref 0–44)
AST: 15 U/L (ref 15–41)
Albumin: 3.4 g/dL — ABNORMAL LOW (ref 3.5–5.0)
Alkaline Phosphatase: 19 U/L — ABNORMAL LOW (ref 38–126)
Anion gap: 8 (ref 5–15)
BUN: 18 mg/dL (ref 8–23)
CO2: 28 mmol/L (ref 22–32)
Calcium: 8.9 mg/dL (ref 8.9–10.3)
Chloride: 104 mmol/L (ref 98–111)
Creatinine, Ser: 0.79 mg/dL (ref 0.61–1.24)
GFR, Estimated: >60 mL/min (ref >60)
Glucose, Bld: 91 mg/dL (ref 70–99)
Potassium: 4 mmol/L (ref 3.5–5.1)
Sodium: 140 mmol/L (ref 135–145)
Total Bilirubin: 0.6 mg/dL (ref 0.3–1.2)
Total Protein: 5.2 g/dL — ABNORMAL LOW (ref 6.5–8.1)

## 2020-05-28 LAB — CBC WITH DIFFERENTIAL/PLATELET
Abs Immature Granulocytes: 0 10*3/uL (ref 0.00–0.07)
Basophils Absolute: 0 10*3/uL (ref 0.0–0.1)
Basophils Relative: 0 %
Eosinophils Absolute: 0 10*3/uL (ref 0.0–0.5)
Eosinophils Relative: 1 %
HCT: 40.1 % (ref 39.0–52.0)
Hemoglobin: 13.4 g/dL (ref 13.0–17.0)
Immature Granulocytes: 0 %
Lymphocytes Relative: 32 %
Lymphs Abs: 1.5 10*3/uL (ref 0.7–4.0)
MCH: 31.8 pg (ref 26.0–34.0)
MCHC: 33.4 g/dL (ref 30.0–36.0)
MCV: 95.2 fL (ref 80.0–100.0)
Monocytes Absolute: 0.5 10*3/uL (ref 0.1–1.0)
Monocytes Relative: 10 %
Neutro Abs: 2.7 10*3/uL (ref 1.7–7.7)
Neutrophils Relative %: 57 %
Platelets: 121 10*3/uL — ABNORMAL LOW (ref 150–400)
RBC: 4.21 MIL/uL — ABNORMAL LOW (ref 4.22–5.81)
RDW: 13.9 % (ref 11.5–15.5)
WBC: 4.7 10*3/uL (ref 4.0–10.5)
nRBC: 0 % (ref 0.0–0.2)

## 2020-05-28 LAB — MAGNESIUM: Magnesium: 1.8 mg/dL (ref 1.7–2.4)

## 2020-05-28 MED ORDER — DIPHENHYDRAMINE HCL 25 MG PO CAPS
50.0000 mg | ORAL_CAPSULE | Freq: Once | ORAL | Status: AC
  Administered 2020-05-28: 50 mg via ORAL
  Filled 2020-05-28: qty 2

## 2020-05-28 MED ORDER — DARATUMUMAB-HYALURONIDASE-FIHJ 1800-30000 MG-UT/15ML ~~LOC~~ SOLN
1800.0000 mg | Freq: Once | SUBCUTANEOUS | Status: AC
  Administered 2020-05-28: 1800 mg via SUBCUTANEOUS
  Filled 2020-05-28: qty 15

## 2020-05-28 MED ORDER — SODIUM CHLORIDE 0.9% FLUSH
10.0000 mL | INTRAVENOUS | Status: DC | PRN
  Administered 2020-05-28: 10 mL

## 2020-05-28 MED ORDER — SODIUM CHLORIDE 0.9 % IV SOLN
Freq: Once | INTRAVENOUS | Status: AC
Start: 2020-05-28 — End: 2020-05-28

## 2020-05-28 MED ORDER — PROCHLORPERAZINE MALEATE 10 MG PO TABS
10.0000 mg | ORAL_TABLET | Freq: Once | ORAL | Status: AC
  Administered 2020-05-28: 10 mg via ORAL
  Filled 2020-05-28: qty 1

## 2020-05-28 MED ORDER — ZOLEDRONIC ACID 4 MG/100ML IV SOLN
4.0000 mg | Freq: Once | INTRAVENOUS | Status: AC
  Administered 2020-05-28: 4 mg via INTRAVENOUS
  Filled 2020-05-28: qty 100

## 2020-05-28 MED ORDER — DEXAMETHASONE 4 MG PO TABS
20.0000 mg | ORAL_TABLET | Freq: Once | ORAL | Status: AC
Start: 2020-05-28 — End: 2020-05-28
  Administered 2020-05-28: 20 mg via ORAL
  Filled 2020-05-28: qty 5

## 2020-05-28 MED ORDER — HEPARIN SOD (PORK) LOCK FLUSH 100 UNIT/ML IV SOLN
500.0000 [IU] | Freq: Once | INTRAVENOUS | Status: AC | PRN
  Administered 2020-05-28: 500 [IU]

## 2020-05-28 MED ORDER — ACETAMINOPHEN 325 MG PO TABS
650.0000 mg | ORAL_TABLET | Freq: Once | ORAL | Status: AC
  Administered 2020-05-28: 650 mg via ORAL
  Filled 2020-05-28: qty 2

## 2020-05-28 NOTE — Progress Notes (Signed)
Patient assessed by Dr Tera Helper.  Labs and scans reviewed.  Okay for treatment.

## 2020-05-28 NOTE — Progress Notes (Signed)
Labs reviewed with MD today, proceed as planned per MD.   Treatment given per orders. Patient tolerated it well without problems. Vitals stable and discharged home from clinic via wheelchair. Follow up as scheduled.

## 2020-05-28 NOTE — Patient Instructions (Signed)
[facility]  Discharge Instructions: Thank you for choosing [facility] to provide your oncology and hematology care.  If you have a lab appointment with the [facility], please come in thru the Main Entrance and check in at the main information desk.  Wear comfortable clothing and clothing appropriate for easy access to any Portacath or PICC line.   We strive to give you quality time with your provider. You may need to reschedule your appointment if you arrive late (15 or more minutes).  Arriving late affects you and other patients whose appointments are after yours.  Also, if you miss three or more appointments without notifying the office, you may be dismissed from the clinic at the provider's discretion.      For prescription refill requests, have your pharmacy contact our office and allow 72 hours for refills to be completed.    Today you received the following chemotherapy: Darzalex injection , zometa infusion.    To help prevent nausea and vomiting after your treatment, we encourage you to take your nausea medication as directed.  BELOW ARE SYMPTOMS THAT SHOULD BE REPORTED IMMEDIATELY: . *FEVER GREATER THAN 100.4 F (38 C) OR HIGHER . *CHILLS OR SWEATING . *NAUSEA AND VOMITING THAT IS NOT CONTROLLED WITH YOUR NAUSEA MEDICATION . *UNUSUAL SHORTNESS OF BREATH . *UNUSUAL BRUISING OR BLEEDING . *URINARY PROBLEMS (pain or burning when urinating, or frequent urination) . *BOWEL PROBLEMS (unusual diarrhea, constipation, pain near the anus) . TENDERNESS IN MOUTH AND THROAT WITH OR WITHOUT PRESENCE OF ULCERS (sore throat, sores in mouth, or a toothache) . UNUSUAL RASH, SWELLING OR PAIN  . UNUSUAL VAGINAL DISCHARGE OR ITCHING   Items with * indicate a potential emergency and should be followed up as soon as possible or go to the Emergency Department if any problems should occur.  Please show the CHEMOTHERAPY ALERT CARD or IMMUNOTHERAPY ALERT CARD at check-in to  the Emergency Department and triage nurse.  Should you have questions after your visit or need to cancel or reschedule your appointment, please contact [facility] [phone]  and follow the prompts.  Office hours are 8:00 a.m. to 4:30 p.m. Monday - Friday. Please note that voicemails left after 4:00 p.m. may not be returned until the following business day.  We are closed weekends and major holidays. You have access to a nurse at all times for urgent questions. Please call the main number to the clinic [phone] and follow the prompts.  For any non-urgent questions, you may also contact your provider using MyChart. We now offer e-Visits for anyone 67 and older to request care online for non-urgent symptoms. For details visit [email or web address].   Also download the MyChart app! Go to the app store, search "MyChart", open the app, select [facility], and log in with your MyChart username and password.  Due to Covid, a mask is required upon entering the hospital/clinic. If you do not have a mask, one will be given to you upon arrival. For doctor visits, patients may have 1 support person aged 61 or older with them. For treatment visits, patients cannot have anyone with them due to current Covid guidelines and our immunocompromised population.

## 2020-05-28 NOTE — Progress Notes (Signed)
Dean [name], Dean [name] [id number]   CLINIC:  Medical Oncology/Hematology  PCP:  [name], MD [address] [place] [address] [phone]   REASON FOR VISIT:  Follow-up for IgG kappa multiple myeloma  PRIOR THERAPY: Velcade x 6 cycles from 04/07/2014 to 07/28/2014; x 7 cycles from 02/18/2017 to 08/26/2017  NGS Results: Not done  CURRENT THERAPY: Darzalex Faspro every 4 weeks  BRIEF ONCOLOGIC HISTORY:  Oncology History  Multiple myeloma without remission ([place])  11/16/2012 Initial Diagnosis   L occipital condyle destructive lesion, not biopsied secondary to location. XRT   12/01/2012 Imaging   L3 superior endplate compression FX, no lytic lesions to suggest myeloma   02/06/2014 Imaging   soft tissue mass along the right posterior fifth rib with some rib destruction   02/16/2014 Miscellaneous   Normal CBC, Normal CMP, kappa,lamda ratio of 2.62 (abnormal), UIEP with slightly restricted band, IgG kappa, SPEP/IEP with monoclonal protein at 0.79 g/dl, normal igG, suppressed IGM   02/20/2014 Bone Marrow Biopsy   33% kappa restricted plasma cells Normal FISH, normal cytogenetics   02/21/2014 - 03/08/2014 Radiation Therapy   30Gy to R rib lesion, lesion not biopsied   03/16/2014 PET scan   Scattered hypermetabolic osseous lesions, including the calvarium, ribs, left scapula, sacrum, and left femoral shaft. An expansile left vertebral body lesion at L3 extends into the epidural space of the left lateral recess,    03/17/2014 - 04/07/2014 Chemotherapy   Velcade and Dexamethasone due to initial denial of Revlimid by insurance company.  Zometa monthly.   03/22/2014 Imaging   MR_ L-Spine- Enhancing lesions compatible with multiple myeloma at L2, L3, and S1-2.   04/07/2014 - 07/28/2014 Chemotherapy   RVD with Revlimid at 25 mg days 1-14.  Revlimid dose reduced to 25 mg every other day x 14 days with 7 day respite beginning on day 1 of  cycle 2.   04/17/2014 Adverse Reaction   Revlimid-induced rash.  Treated with steroids.   07/28/2014 - 08/04/2014 Chemotherapy   Revlimid daily   08/04/2014 Adverse Reaction   Velcade-induced peripheral neuropathy   08/04/2014 Treatment Plan Change   D/C Velcade   08/11/2014 PET scan   Response to therapy. Improvement and resolution of foci of osseous hypermetabolism.   08/22/2014 - 08/28/2014 Chemotherapy   Revlimid 25 mg days 1-21 every 28 days   08/28/2014 Adverse Reaction   Revlimid-induced rash. Medication held.  Medrol dose Pak prescribed.   09/04/2014 - 01/08/2015 Chemotherapy   Revlimid 10 mg every other day, without dexamethasone   01/08/2015 - 01/15/2015 Hospital Admission   Colonic diverticular abscess with IR drain placement   03/02/2015 PET scan   Only bony uptake is low activity at site of deformity/callus at R second rib FX, high activity in sigmoid colon, advanced sigmoid divertidulosis. Abscess not resolved?   06/04/2015 Imaging   CT nonobstructive L renal calculus, diverticulosis of descending and sigmoid colon without inflammation, moderate prostatic enlargement   07/12/2015 Surgery   Diverticulitis s/p robotic sigmoid colectomy with Dr. Johney Maine   08/23/2015 PET scan   Primarily similar hypermetabolic osseous foci within the R sided ribs. new L third rib focus of hypermetab and sclerosis is favored to be related to healing FX. no soft tissue myeloma id'd. Presumed postop hypermetab and edema about the R pelvic wall    03/06/2016 PET scan   1. Reduced activity in the prior lesion such as the right second and  fifth rib lesions, activity nearly completely resolved and significantly less than added mediastinal blood pool activity. No new lesions are identified. 2. Other imaging findings of potential clinical significance: Coronary, aortic arch, and branch vessel atherosclerotic vascular disease. Aortoiliac atherosclerotic vascular disease. Enlarged prostate gland. Colonic  diverticula.   02/17/2017 PET scan   HEAD/NECK: No hypermetabolic activity in the scalp. No hypermetabolic cervical lymph nodes. CHEST: No hypermetabolic mediastinal or hilar nodes. Right upper lobe scarring/atelectasis. No suspicious pulmonary nodules on the CT scan. ABDOMEN/PELVIS: No abnormal hypermetabolic activity within the liver, pancreas, adrenal glands, or spleen. No hypermetabolic lymph nodes in the abdomen or pelvis. Atherosclerotic calcifications of the abdominal aorta and branch vessels. Left renal sinus cysts. Sigmoid diverticulosis, without evidence of diverticulitis. Prostatomegaly. SKELETON: Vague hypermetabolism involving the inferior sternum, max SUV 3.2, previously 8.2. Focal hypermetabolism involving the left sacrum, max SUV 4.0, previously 6.7. EXTREMITIES: No abnormal hypermetabolic activity in the lower extremities.   02/18/2017 -  Chemotherapy   Bortezomib 1.41m/m2 QWk + Dexamethasone 170mQTue/Fri --Cycle #1, 02/18/17    02/18/2017 - 08/26/2017 Chemotherapy   The patient had bortezomib SQ (VELCADE) chemo injection 3 mg, 1.3 mg/m2 = 3 mg, Subcutaneous,  Once, 7 of 9 cycles Administration: 3 mg (02/18/2017), 3 mg (02/26/2017), 3 mg (03/04/2017), 3 mg (03/11/2017), 3 mg (04/08/2017), 3 mg (03/18/2017), 3 mg (03/25/2017), 3 mg (04/01/2017), 3 mg (05/06/2017), 3 mg (05/20/2017), 3 mg (06/03/2017), 3 mg (06/17/2017), 3 mg (07/01/2017), 3 mg (07/15/2017), 3 mg (07/29/2017), 3 mg (08/12/2017), 3 mg (08/26/2017)  for chemotherapy treatment.    09/17/2017 -  Chemotherapy      Patient is on Antibody Plan: MYELOMA DARATUMUMAB/POMALIDOMIDE/DEXAMETHASONE Q28D X 7 CYCLES      CANCER STAGING: Cancer Staging No matching staging information was found for the patient.  INTERVAL HISTORY:  Dean [name] a [age].o. male, returns for routine follow-up and consideration for next cycle of immunotherapy. Dean [name] last seen on 04/30/2020.  Due for cycle #31 of Darzalex Faspro today.   Today he is  accompanied by his sister. Overall, he tells me he has been feeling fair. He complains of having ankle swelling as well as pain in his bilateral hips and across the back and radiating into his right inguinal area which started around 04/08; he reports that he fell about 1 month ago after his last treatment shortly after getting out of bed. His pain has gotten worse. He is taking oxycodone 10 mg every 8 hours which helps dull the pain and ibuprofen 1 tablet BID.  Overall, he feels ready for next cycle of immunotherapy today.    REVIEW OF SYSTEMS:  Review of Systems  Constitutional: Positive for fatigue (25%). Negative for appetite change.  Cardiovascular: Positive for leg swelling.  Gastrointestinal: Positive for constipation.  Genitourinary: Positive for dysuria (intermittent).   Musculoskeletal: Positive for arthralgias (10/10 R hip & L shoulder pain).  Neurological: Positive for headaches (occasional).  Psychiatric/Behavioral: Positive for sleep disturbance.  All other systems reviewed and are negative.   PAST MEDICAL/SURGICAL HISTORY:  Past Medical History:  Diagnosis Date  . BPH (benign prostatic hyperplasia)   . Colonic diverticular abscess 01/08/2015  . Diverticulitis 1156/38/75 complicated by abscess and required percutaneous drainage  . GERD (gastroesophageal reflux disease)   . H/O ETOH abuse   . History of chemotherapy last done jan 2017  . History of radiation therapy 01/05/13-02/10/13   45 gray to left occipital condyle region  . History of radiation therapy 12/01/16-12/10/16  Parasternal nodule, chest- 24 Gy total delivered in 8 fractions, Left sacro-iliac, pelvis- 24 Gy total delivered in 8 fractions   . History of radiation therapy 02/19/17-02/22/17   right temporal scalp 30 Gy in 10 fractions  . Intra-abdominal abscess ([place])   . Multiple myeloma ([place]) 2015  . Neuropathy   . Radiation 02/21/14-03/08/14   right posterior chest wall area 30 gray  . Radiation  04/19/14-05/02/14   lumbar spine 25 gray  . Skull lesion    Left occipital condyle  . Wrist fracture, left    x 2   Past Surgical History:  Procedure Laterality Date  . COLONOSCOPY N/A 04/19/2015   Procedure: COLONOSCOPY;  Surgeon: Danie Binder, MD;  Location: [facility];  Service: Endoscopy;  Laterality: N/A;  1:30 PM  . CYST EXCISION  1959   tail bone  . IR IMAGING GUIDED PORT INSERTION  09/25/2017    SOCIAL HISTORY:  Social History   Socioeconomic History  . Marital status: Married    Spouse name: Not on file  . Number of children: 3  . Years of education: Not on file  . Highest education level: Not on file  Occupational History    Employer: RETIRED  Tobacco Use  . Smoking status: Never Smoker  . Smokeless tobacco: Never Used  Vaping Use  . Vaping Use: Never used  Substance and Sexual Activity  . Alcohol use: No    Comment: " Not much no more"  . Drug use: No  . Sexual activity: Not on file  Other Topics Concern  . Not on file  Social History Narrative  . Not on file   Social Determinants of Health   Financial Resource Strain: Low Risk   . Difficulty of Paying Living Expenses: Not very hard  Food Insecurity: No Food Insecurity  . Worried About Charity fundraiser in the Last Year: Never true  . Ran Out of Food in the Last Year: Never true  Transportation Needs: No Transportation Needs  . Lack of Transportation (Medical): No  . Lack of Transportation (Non-Medical): No  Physical Activity: Inactive  . Days of Exercise per Week: 0 days  . Minutes of Exercise per Session: 0 min  Stress: No Stress Concern Present  . Feeling of Stress : Not at all  Social Connections: Moderately Isolated  . Frequency of Communication with Friends and Family: More than three times a week  . Frequency of Social Gatherings with Friends and Family: Once a week  . Attends Religious Services: Never  . Active Member of Clubs or Organizations: No  . Attends Archivist  Meetings: Never  . Marital Status: Married  Human resources officer Violence: Not At Risk  . Fear of Current or Ex-Partner: No  . Emotionally Abused: No  . Physically Abused: No  . Sexually Abused: No    FAMILY HISTORY:  Family History  Problem Relation Age of Onset  . Diabetes Mother   . Stroke Mother   . Hypertension Father     CURRENT MEDICATIONS:  Current Outpatient Medications  Medication Sig Dispense Refill  . acyclovir (ZOVIRAX) 400 MG tablet Take 1 tablet (400 mg total) by mouth 2 (two) times daily. 60 tablet 6  . ALPRAZolam (XANAX) 1 MG tablet TAKE 1 TABLET BY MOUTH 3 TIMES A DAY AS NEEDED FOR ANXIETY. 60 tablet 5  . CALCIUM-VITAMIN D PO Take 1 tablet by mouth 2 (two) times daily.     Marland Kitchen dexamethasone (DECADRON) 4 MG tablet     .  fluticasone (FLONASE) 50 MCG/ACT nasal spray Place 2 sprays into both nostrils daily. 16 g 6  . furosemide (LASIX) 20 MG tablet TAKE 1 TABLET BY MOUTH ONCE A DAY AS NEEDED 30 tablet 3  . gabapentin (NEURONTIN) 400 MG capsule Take 1 capsule (400 mg total) by mouth 3 (three) times daily. 90 capsule 4  . guaiFENesin (MUCINEX) 600 MG 12 hr tablet Take 1 tablet (600 mg total) by mouth 2 (two) times daily as needed for cough or to loosen phlegm. 30 tablet 0  . Homeopathic Products ([place]) Apply topically as needed.    Marland Kitchen ibuprofen (ADVIL) 600 MG tablet Take 1 tablet (600 mg total) by mouth every 8 (eight) hours as needed. 30 tablet 0  . montelukast (SINGULAIR) 10 MG tablet TAKE 1 TABLET BY MOUTH EVERY NIGHT AT BEDTIME 30 tablet 3  . omeprazole (PRILOSEC) 20 MG capsule TAKE 1 CAPSULE BY MOUTH ONCE DAILY 30 capsule 5  . Oxycodone HCl 10 MG TABS Take 1 tablet (10 mg total) by mouth every 8 (eight) hours as needed. for pain 120 tablet 0  . senna-docusate (SENOKOT-S) 8.6-50 MG tablet Take 1 tablet by mouth 2 (two) times daily. 60 tablet 5  . tamsulosin (FLOMAX) 0.4 MG CAPS capsule TAKE ONE CAPSULE BY MOUTH EVERY NIGHT ATBEDTIME 30 capsule 11  .  traZODone (DESYREL) 100 MG tablet Take 3 tablets (300 mg total) by mouth at bedtime. TAKE 1 TABLET ( 100MG TOTAL ) BY MOUTH AT BEDTIME. 90 tablet 3  . triamcinolone cream (KENALOG) 0.1 % Apply 1 application topically 2 (two) times daily. 30 g 0  . zolendronic acid (ZOMETA) 4 MG/5ML injection Inject 4 mg into the vein every 8 (eight) weeks.    . lidocaine-prilocaine (EMLA) cream Apply small amount over port one (1) hour prior to appointment. (Patient not taking: Reported on 05/28/2020) 30 g 0   No current facility-administered medications for this visit.    ALLERGIES:  Allergies  Allergen Reactions  . Codeine Other (See Comments)    Headache  . Morphine And Related Other (See Comments)    Makes him feel weird  . Revlimid [Lenalidomide] Other (See Comments)    "Causes me to become weak"    PHYSICAL EXAM:  Performance status (ECOG): 1 - Symptomatic but completely ambulatory  Vitals:   05/28/20 0911  BP: (!) 119/59  Pulse: 64  Resp: 17  Temp: (!) 96.9 F (36.1 C)  SpO2: 98%   Wt Readings from Last 3 Encounters:  05/28/20 198 lb 9.6 oz (90.1 kg)  05/23/20 197 lb (89.4 kg)  04/30/20 193 lb (87.5 kg)   Physical Exam Vitals reviewed.  Constitutional:      Appearance: Normal appearance.  Cardiovascular:     Rate and Rhythm: Normal rate and regular rhythm.     Pulses: Normal pulses.     Heart sounds: Normal heart sounds.  Pulmonary:     Effort: Pulmonary effort is normal.     Breath sounds: Normal breath sounds.  Chest:     Comments: Port-a-Cath in R chest Musculoskeletal:     Lumbar back: Bony tenderness (L sacral ala TTP) present.     Right lower leg: No edema.     Left lower leg: No edema.  Neurological:     General: No focal deficit present.     Mental Status: He is alert and oriented to person, place, and time.  Psychiatric:        Mood and Affect: Mood normal.  Behavior: Behavior normal.     LABORATORY DATA:  I have reviewed the labs as listed.  CBC  Latest Ref Rng & Units 05/28/2020 04/30/2020 04/02/2020  WBC 4.0 - 10.5 K/uL 4.7 5.7 8.9  Hemoglobin 13.0 - 17.0 g/dL 13.4 14.8 14.8  Hematocrit 39.0 - 52.0 % 40.1 44.8 45.0  Platelets 150 - 400 K/uL 121(L) 115(L) 141(L)   CMP Latest Ref Rng & Units 05/28/2020 04/30/2020 04/02/2020  Glucose 70 - 99 mg/dL 91 94 84  BUN 8 - 23 mg/dL _0 Creatinine 0.61 - 1.24 mg/dL 0.79 0.79 0.81  Sodium 135 - 145 mmol/L 140 140 142  Potassium 3.5 - 5.1 mmol/L 4.0 3.7 3.9  Chloride 98 - 111 mmol/L 104 104 105  CO2 22 - 32 mmol/L _1 Calcium 8.9 - 10.3 mg/dL 8.9 8.8(L) 8.9  Total Protein 6.5 - 8.1 g/dL 5.2(L) 6.0(L) 5.3(L)  Total Bilirubin 0.3 - 1.2 mg/dL 0.6 0.7 0.6  Alkaline Phos 38 - 126 U/L 19(L) 21(L) 21(L)  AST 15 - 41 U/L _2 ALT 0 - 44 U/L _3 DIAGNOSTIC IMAGING:  I have independently reviewed the scans and discussed with the patient. DG Lumbar Spine Complete  Result Date: 05/24/2020 CLINICAL DATA:  fall pain, pain in lower back and bilateral hips for 1.5 weeks. Multiple falls over the last few months EXAM: LUMBAR SPINE - COMPLETE 4+ VIEW COMPARISON:  09/10/2016 radiograph, MRI 01/15/2020 FINDINGS: Slight levoconvex curvature. There is multilevel degenerative disc disease throughout the lumbar spine, most severe at L2-L3. There is multilevel facet arthritis throughout bilaterally, moderate-severe in the lower lumbar spine. Again seen are chronic L3 and L4 compression fractures without evidence of progressive height loss in comparison to prior MRI. Unchanged mild height loss of T11. Aortic atherosclerosis. IMPRESSION: Unchanged chronic pathologic compression fractures of L3 and L4 without evidence of progressive height loss. Multilevel degenerative disc disease, severe at L2-L3 with moderate-severe lower lumbar predominant facet arthritis, similar to prior exam. Electronically Signed   By: Maurine Simmering   On: 05/24/2020 10:01   DG HIPS BILAT WITH PELVIS MIN 5 VIEWS  Result Date:  05/24/2020 CLINICAL DATA:  fall hip pain. Pain in lower back and bilateral hips for 1.5 weeks. Multiple falls over the last few months EXAM: DG HIP (WITH OR WITHOUT PELVIS) 5+V BILAT COMPARISON:  Radiograph 11/30/2018, radiograph 09/10/2016, pelvic MRI 04/27/2019 FINDINGS: There is a known chronic left sacral ala fracture and a lytic, likely myelomatous, lesion in the left sacrum. There are no new lesions identified radiographically along the right hip or pelvis. There is no evidence of new acute fracture. Vascular calcifications. There is severe right and moderate left hip osteoarthritis. Lower lumbar degenerative change. Vascular calcifications. IMPRESSION: Chronic left sacral ala fracture. Severe right and moderate left hip osteoarthritis. No new acute fracture identified. Note that if the patient is unable to bear weight or there is high clinical suspicion for hip fracture, CT or MRI would be more sensitive. Electronically Signed   By: Maurine Simmering   On: 05/24/2020 09:52     ASSESSMENT:  1.Recurrent IgG kappa multiple myeloma: -RVD from 04/07/2014 through 07/28/2014 followed by maintenance Revlimid, which was discontinued. -MRI of the brain on 02/09/2017 showed right temporal lesion, status post radiation, PET/CT on January 2019 showed improvement in multiple myeloma with no new lesions. -Velcade changed from every other week on 05/06/2017 secondary to neuropathy. -MRI of the thoracic and lumbar spine on  09/03/2017 showed new rib lesion close to the spine on the left side, possibly eighth rib. There was soft tissue mass in that area. There is chronic L4 compression fracture which is stable. -Status post XRT to the left eighth rib lesion, 30 Gray in 10 fractions from 827 2019-9 12/2017 -DPD regimen started on 09/17/2017, developed erythematous maculopapular rash involving the trunk after taking 3 pills of pomalidomide. -Daratumumab restarted after radiation on 10/28/2017, Pomalyst completely discontinued on  02/12/2018 for recurrent rash. -Subcu daratumumab on 10/25/2018. He takes dexamethasone 10 mg on days of treatment. -On monthly daratumumab and dexamethasone. -Revlimid 10 mg 3 weeks on 1 week offwas supposed to bestarted on 11/24/2019.Revlimid discontinued after first cycle in December secondary to rash.   PLAN:  1. IgG kappa myeloma: -He could not tolerate Revlimid or Pomalyst due to rash. - He is tolerating Darzalex single agent monthly very well. - Myeloma labs from 04/30/2020 shows kappa light chain 25.3 and ratio increased to 6.66.  SPEP was not done. - Myeloma labs from today are pending. - He reportedly fell few days after his last treatment.  He is complaining of pain in the left sacral region and right hip region for the last 2 weeks. - X-rays were ordered by his PMD which showed chronic left sacral ala fracture and severe right and moderate left hip arthritis.  Lumbar spine x-ray shows chronic pathological compression fractures of L3 and L4 without evidence of progressive height loss.  Multi level degenerative disc disease. - Recommend MRI of the pelvis with and without contrast. - Recommend phone follow-up after the MRI.  If there is any lesions, will refer to radiation. - Reviewed his labs which showed platelet count 121.  CMP was also normal. - RTC 4 weeks for follow-up.  2. Low back pain: -He increased oxycodone to 10 mg 2 to 3 tablets/day.  He is also taking ibuprofen 1 tablet every 8 hours.  3. Peripheral neuropathy: -Continue gabapentin twice daily.  4. Bone protection: -Continue Zometa every 8 weeks.  5. Sleeping difficulty: -Continue trazodone 300 mg and Xanax 2 tablets at bedtime.  6. Hypokalemia: -Continue potassium.  Potassium today is 4.0.   Orders placed this encounter:  No orders of the defined types were placed in this encounter.    Derek Jack, MD [place] [phone]   I, Milinda Antis, am acting as a  scribe for Dr. Sanda Linger.  I, Derek Jack MD, have reviewed the above documentation for accuracy and completeness, and I agree with the above.

## 2020-05-28 NOTE — Patient Instructions (Addendum)
[place] at [facility] Discharge Instructions  You were seen today by Dr. Delton Coombes. He went over your recent results and scans. You received your treatment and Zometa injection today. You will be scheduled to have an MRI of your pelvis; Dr. Delton Coombes will call you after the scan results. Dr. Delton Coombes will see you back in 1 month for labs and follow up.   Thank you for choosing [place] at [facility] to provide your oncology and hematology care.  To afford each patient quality time with our provider, please arrive at least 15 minutes before your scheduled appointment time.   If you have a lab appointment with the [facility] please come in thru the Main Entrance and check in at the main information desk  You need to re-schedule your appointment should you arrive 10 or more minutes late.  We strive to give you quality time with our providers, and arriving late affects you and other patients whose appointments are after yours.  Also, if you no show three or more times for appointments you may be dismissed from the clinic at the providers discretion.     Again, thank you for choosing [facility].  Our hope is that these requests will decrease the amount of time that you wait before being seen by our physicians.       _____________________________________________________________  Should you have questions after your visit to [facility], please contact our office at [phone] between the hours of 8:00 a.m. and 4:30 p.m.  Voicemails left after 4:00 p.m. will not be returned until the following business day.  For prescription refill requests, have your pharmacy contact our office and allow 72 hours.    Cancer Center Support Programs:   > Cancer Support Group  2nd Tuesday of the month 1pm-2pm, Journey Room

## 2020-05-28 NOTE — Patient Instructions (Signed)

## 2020-05-29 LAB — KAPPA/LAMBDA LIGHT CHAINS
Kappa free light chain: 25.2 mg/L — ABNORMAL HIGH (ref 3.3–19.4)
Kappa, lambda light chain ratio: 6.63 — ABNORMAL HIGH (ref 0.26–1.65)
Lambda free light chains: 3.8 mg/L — ABNORMAL LOW (ref 5.7–26.3)

## 2020-05-30 LAB — PROTEIN ELECTROPHORESIS, SERUM
A/G Ratio: 1.9 — ABNORMAL HIGH (ref 0.7–1.7)
Albumin ELP: 3.2 g/dL (ref 2.9–4.4)
Alpha-1-Globulin: 0.2 g/dL (ref 0.0–0.4)
Alpha-2-Globulin: 0.6 g/dL (ref 0.4–1.0)
Beta Globulin: 0.6 g/dL — ABNORMAL LOW (ref 0.7–1.3)
Gamma Globulin: 0.2 g/dL — ABNORMAL LOW (ref 0.4–1.8)
Globulin, Total: 1.7 g/dL — ABNORMAL LOW (ref 2.2–3.9)
M-Spike, %: 0.1 g/dL — ABNORMAL HIGH
Total Protein ELP: 4.9 g/dL — ABNORMAL LOW (ref 6.0–8.5)

## 2020-06-06 ENCOUNTER — Ambulatory Visit (HOSPITAL_COMMUNITY)
Admission: RE | Admit: 2020-06-06 | Discharge: 2020-06-06 | Disposition: A | Discharge status: Home / Self Care | Payer: Medicare Other | Source: Ambulatory Visit | Attending: Hematology | Admitting: Hematology

## 2020-06-06 DIAGNOSIS — C9000 Multiple myeloma not having achieved remission: Secondary | ICD-10-CM | POA: Diagnosis not present

## 2020-06-06 DIAGNOSIS — Z8579 Personal history of other malignant neoplasms of lymphoid, hematopoietic and related tissues: Secondary | ICD-10-CM | POA: Diagnosis not present

## 2020-06-06 DIAGNOSIS — M549 Dorsalgia, unspecified: Secondary | ICD-10-CM | POA: Diagnosis not present

## 2020-06-06 MED ORDER — GADOBUTROL 1 MMOL/ML IV SOLN
10.0000 mL | Freq: Once | INTRAVENOUS | Status: AC | PRN
  Administered 2020-06-06: 10 mL via INTRAVENOUS

## 2020-06-07 ENCOUNTER — Other Ambulatory Visit: Payer: Self-pay

## 2020-06-07 ENCOUNTER — Inpatient Hospital Stay (HOSPITAL_COMMUNITY): Payer: Medicare Other | Attending: Hematology and Oncology | Admitting: Hematology

## 2020-06-07 DIAGNOSIS — M25551 Pain in right hip: Secondary | ICD-10-CM | POA: Diagnosis not present

## 2020-06-07 DIAGNOSIS — Z5112 Encounter for antineoplastic immunotherapy: Secondary | ICD-10-CM | POA: Insufficient documentation

## 2020-06-07 DIAGNOSIS — M898X9 Other specified disorders of bone, unspecified site: Secondary | ICD-10-CM

## 2020-06-07 DIAGNOSIS — C9000 Multiple myeloma not having achieved remission: Secondary | ICD-10-CM

## 2020-06-07 DIAGNOSIS — Z79899 Other long term (current) drug therapy: Secondary | ICD-10-CM | POA: Insufficient documentation
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
## 2020-06-14 ENCOUNTER — Other Ambulatory Visit: Payer: Self-pay

## 2020-06-14 ENCOUNTER — Encounter: Payer: Self-pay | Admitting: Family Medicine

## 2020-06-14 ENCOUNTER — Ambulatory Visit (INDEPENDENT_AMBULATORY_CARE_PROVIDER_SITE_OTHER): Payer: Medicare Other | Admitting: Family Medicine

## 2020-06-14 VITALS — BP 122/64 | HR 80 | Temp 98.3°F | Resp 16 | Ht 68.0 in | Wt 198.0 lb

## 2020-06-14 DIAGNOSIS — T300 Burn of unspecified body region, unspecified degree: Secondary | ICD-10-CM

## 2020-06-14 DIAGNOSIS — T24092A Burn of unspecified degree of multiple sites of left lower limb, except ankle and foot, initial encounter: Secondary | ICD-10-CM | POA: Diagnosis not present

## 2020-06-14 DIAGNOSIS — N3281 Overactive bladder: Secondary | ICD-10-CM | POA: Diagnosis not present

## 2020-06-14 DIAGNOSIS — T24091A Burn of unspecified degree of multiple sites of right lower limb, except ankle and foot, initial encounter: Secondary | ICD-10-CM | POA: Diagnosis not present

## 2020-06-14 MED ORDER — OXYBUTYNIN CHLORIDE ER 10 MG PO TB24: 10.0000 mg | ORAL_TABLET | Freq: Every day | ORAL | 1 refills | Status: DC

## 2020-06-14 MED ORDER — CEPHALEXIN 500 MG PO CAPS: 500.0000 mg | ORAL_CAPSULE | Freq: Three times a day (TID) | ORAL | 0 refills | Status: DC

## 2020-06-14 MED ORDER — SILVER SULFADIAZINE 1 % EX CREA: 1.0000 "application " | TOPICAL_CREAM | Freq: Every day | CUTANEOUS | 0 refills | Status: DC
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
## 2020-06-23 NOTE — Progress Notes (Signed)
[place], [place] [address]   CLINIC:  Medical Oncology/Hematology  PCP:  [name], MD [address] [place] [address] [phone]   REASON FOR VISIT:  Follow-up for IgG kappa multiple myeloma  PRIOR THERAPY: Velcade x 6 cycles from 04/07/2014 to 07/28/2014; x 7 cycles from 02/18/2017 to 08/26/2017  NGS Results: not done  CURRENT THERAPY: Darzalex Faspro every 4 weeks  BRIEF ONCOLOGIC HISTORY:  Oncology History  Multiple myeloma without remission ([place])  11/16/2012 Initial Diagnosis   L occipital condyle destructive lesion, not biopsied secondary to location. XRT   12/01/2012 Imaging   L3 superior endplate compression FX, no lytic lesions to suggest myeloma   02/06/2014 Imaging   soft tissue mass along the right posterior fifth rib with some rib destruction   02/16/2014 Miscellaneous   Normal CBC, Normal CMP, kappa,lamda ratio of 2.62 (abnormal), UIEP with slightly restricted band, IgG kappa, SPEP/IEP with monoclonal protein at 0.79 g/dl, normal igG, suppressed IGM   02/20/2014 Bone Marrow Biopsy   33% kappa restricted plasma cells Normal FISH, normal cytogenetics   02/21/2014 - 03/08/2014 Radiation Therapy   30Gy to R rib lesion, lesion not biopsied   03/16/2014 PET scan   Scattered hypermetabolic osseous lesions, including the calvarium, ribs, left scapula, sacrum, and left femoral shaft. An expansile left vertebral body lesion at L3 extends into the epidural space of the left lateral recess,    03/17/2014 - 04/07/2014 Chemotherapy   Velcade and Dexamethasone due to initial denial of Revlimid by insurance company.  Zometa monthly.   03/22/2014 Imaging   MR_ L-Spine- Enhancing lesions compatible with multiple myeloma at L2, L3, and S1-2.   04/07/2014 - 07/28/2014 Chemotherapy   RVD with Revlimid at 25 mg days 1-14.  Revlimid dose reduced to 25 mg every other day x 14 days with 7 day respite beginning on day 1 of  cycle 2.   04/17/2014 Adverse Reaction   Revlimid-induced rash.  Treated with steroids.   07/28/2014 - 08/04/2014 Chemotherapy   Revlimid daily   08/04/2014 Adverse Reaction   Velcade-induced peripheral neuropathy   08/04/2014 Treatment Plan Change   D/C Velcade   08/11/2014 PET scan   Response to therapy. Improvement and resolution of foci of osseous hypermetabolism.   08/22/2014 - 08/28/2014 Chemotherapy   Revlimid 25 mg days 1-21 every 28 days   08/28/2014 Adverse Reaction   Revlimid-induced rash. Medication held.  Medrol dose Pak prescribed.   09/04/2014 - 01/08/2015 Chemotherapy   Revlimid 10 mg every other day, without dexamethasone   01/08/2015 - 01/15/2015 Hospital Admission   Colonic diverticular abscess with IR drain placement   03/02/2015 PET scan   Only bony uptake is low activity at site of deformity/callus at R second rib FX, high activity in sigmoid colon, advanced sigmoid divertidulosis. Abscess not resolved?   06/04/2015 Imaging   CT nonobstructive L renal calculus, diverticulosis of descending and sigmoid colon without inflammation, moderate prostatic enlargement   07/12/2015 Surgery   Diverticulitis s/p robotic sigmoid colectomy with Dr. Johney Maine   08/23/2015 PET scan   Primarily similar hypermetabolic osseous foci within the R sided ribs. new L third rib focus of hypermetab and sclerosis is favored to be related to healing FX. no soft tissue myeloma id'd. Presumed postop hypermetab and edema about the R pelvic wall    03/06/2016 PET scan   1. Reduced activity in the prior lesion such as the right second and  fifth rib lesions, activity nearly completely resolved and significantly less than added mediastinal blood pool activity. No new lesions are identified. 2. Other imaging findings of potential clinical significance: Coronary, aortic arch, and branch vessel atherosclerotic vascular disease. Aortoiliac atherosclerotic vascular disease. Enlarged prostate gland. Colonic  diverticula.   02/17/2017 PET scan   HEAD/NECK: No hypermetabolic activity in the scalp. No hypermetabolic cervical lymph nodes. CHEST: No hypermetabolic mediastinal or hilar nodes. Right upper lobe scarring/atelectasis. No suspicious pulmonary nodules on the CT scan. ABDOMEN/PELVIS: No abnormal hypermetabolic activity within the liver, pancreas, adrenal glands, or spleen. No hypermetabolic lymph nodes in the abdomen or pelvis. Atherosclerotic calcifications of the abdominal aorta and branch vessels. Left renal sinus cysts. Sigmoid diverticulosis, without evidence of diverticulitis. Prostatomegaly. SKELETON: Vague hypermetabolism involving the inferior sternum, max SUV 3.2, previously 8.2. Focal hypermetabolism involving the left sacrum, max SUV 4.0, previously 6.7. EXTREMITIES: No abnormal hypermetabolic activity in the lower extremities.   02/18/2017 -  Chemotherapy   Bortezomib 1.28m/m2 QWk + Dexamethasone 122mQTue/Fri --Cycle #1, 02/18/17    02/18/2017 - 08/26/2017 Chemotherapy   The patient had bortezomib SQ (VELCADE) chemo injection 3 mg, 1.3 mg/m2 = 3 mg, Subcutaneous,  Once, 7 of 9 cycles Administration: 3 mg (02/18/2017), 3 mg (02/26/2017), 3 mg (03/04/2017), 3 mg (03/11/2017), 3 mg (04/08/2017), 3 mg (03/18/2017), 3 mg (03/25/2017), 3 mg (04/01/2017), 3 mg (05/06/2017), 3 mg (05/20/2017), 3 mg (06/03/2017), 3 mg (06/17/2017), 3 mg (07/01/2017), 3 mg (07/15/2017), 3 mg (07/29/2017), 3 mg (08/12/2017), 3 mg (08/26/2017)  for chemotherapy treatment.    09/17/2017 -  Chemotherapy      Patient is on Antibody Plan: MYELOMA DARATUMUMAB/POMALIDOMIDE/DEXAMETHASONE Q28D X 7 CYCLES      CANCER STAGING: Cancer Staging No matching staging information was found for the patient.  INTERVAL HISTORY:  [name] a [age].o. male, returns for routine follow-up and consideration for next cycle of chemotherapy. Dean [name] last seen on 05/28/2020.  Due for cycle #32 of Darzalex Faspro today.   Overall, he  tells me he has been feeling fair. 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 He denies any diarrhea, but reports occasional constipation.  He is walking aided by a cane. He denies any abdominal pain or recent infections. He lost around 5 lbs in 1 month, but his appetite is excellent.   Overall, he feels ready for next cycle of chemo today.    REVIEW OF SYSTEMS:  Review of Systems  Constitutional: Positive for fatigue (25%). Negative for appetite change.  Gastrointestinal: Positive for abdominal pain (right) and constipation (occaional). Negative for diarrhea.  Endocrine: Positive for hot flashes (cold and hot flashes).  Musculoskeletal: Positive for back pain (10/10).  Neurological: Positive for headaches.  All other systems reviewed and are negative.   PAST MEDICAL/SURGICAL HISTORY:  Past Medical History:  Diagnosis Date  . BPH (benign prostatic hyperplasia)   . Colonic diverticular abscess 01/08/2015  . Diverticulitis 1148/01/65 complicated by abscess and required percutaneous drainage  . GERD (gastroesophageal reflux disease)   . H/O ETOH abuse   . History of chemotherapy last done jan 2017  . History of radiation therapy 01/05/13-02/10/13   45 gray to left occipital condyle region  . History of radiation therapy 12/01/16-12/10/16   Parasternal nodule, chest- 24 Gy total delivered in 8 fractions, Left sacro-iliac, pelvis- 24 Gy total delivered in 8 fractions   . History of radiation therapy 02/19/17-02/22/17   right temporal  scalp 30 Gy in 10 fractions  . Intra-abdominal abscess ([place])   . Multiple myeloma ([place]) 2015  . Neuropathy   . Radiation 02/21/14-03/08/14   right posterior chest wall area 30 gray  . Radiation 04/19/14-05/02/14   lumbar spine 25 gray  . Skull lesion    Left occipital condyle  . Wrist fracture, left    x 2   Past Surgical History:  Procedure Laterality Date  . COLONOSCOPY N/A 04/19/2015   Procedure: COLONOSCOPY;  Surgeon:  Danie Binder, MD;  Location: [facility];  Service: Endoscopy;  Laterality: N/A;  1:30 PM  . CYST EXCISION  1959   tail bone  . IR IMAGING GUIDED PORT INSERTION  09/25/2017    SOCIAL HISTORY:  Social History   Socioeconomic History  . Marital status: Married    Spouse name: Not on file  . Number of children: 3  . Years of education: Not on file  . Highest education level: Not on file  Occupational History    Employer: RETIRED  Tobacco Use  . Smoking status: Never Smoker  . Smokeless tobacco: Never Used  Vaping Use  . Vaping Use: Never used  Substance and Sexual Activity  . Alcohol use: No    Comment: " Not much no more"  . Drug use: No  . Sexual activity: Not on file  Other Topics Concern  . Not on file  Social History Narrative  . Not on file   Social Determinants of Health   Financial Resource Strain: Low Risk   . Difficulty of Paying Living Expenses: Not very hard  Food Insecurity: No Food Insecurity  . Worried About Charity fundraiser in the Last Year: Never true  . Ran Out of Food in the Last Year: Never true  Transportation Needs: No Transportation Needs  . Lack of Transportation (Medical): No  . Lack of Transportation (Non-Medical): No  Physical Activity: Inactive  . Days of Exercise per Week: 0 days  . Minutes of Exercise per Session: 0 min  Stress: No Stress Concern Present  . Feeling of Stress : Not at all  Social Connections: Moderately Isolated  . Frequency of Communication with Friends and Family: More than three times a week  . Frequency of Social Gatherings with Friends and Family: Once a week  . Attends Religious Services: Never  . Active Member of Clubs or Organizations: No  . Attends Archivist Meetings: Never  . Marital Status: Married  Human resources officer Violence: Not At Risk  . Fear of Current or Ex-Partner: No  . Emotionally Abused: No  . Physically Abused: No  . Sexually Abused: No    FAMILY HISTORY:  Family History   Problem Relation Age of Onset  . Diabetes Mother   . Stroke Mother   . Hypertension Father     CURRENT MEDICATIONS:  Current Outpatient Medications  Medication Sig Dispense Refill  . acyclovir (ZOVIRAX) 400 MG tablet Take 1 tablet (400 mg total) by mouth 2 (two) times daily. 60 tablet 6  . ALPRAZolam (XANAX) 1 MG tablet TAKE 1 TABLET BY MOUTH 3 TIMES A DAY AS NEEDED FOR ANXIETY. 60 tablet 5  . CALCIUM-VITAMIN D PO Take 1 tablet by mouth 2 (two) times daily.     . cephALEXin (KEFLEX) 500 MG capsule Take 1 capsule (500 mg total) by mouth 3 (three) times daily. 21 capsule 0  . dexamethasone (DECADRON) 4 MG tablet     . fluticasone (FLONASE) 50 MCG/ACT nasal  spray Place 2 sprays into both nostrils daily. 16 g 6  . furosemide (LASIX) 20 MG tablet TAKE 1 TABLET BY MOUTH ONCE A DAY AS NEEDED 30 tablet 3  . gabapentin (NEURONTIN) 400 MG capsule Take 1 capsule (400 mg total) by mouth 3 (three) times daily. 90 capsule 4  . guaiFENesin (MUCINEX) 600 MG 12 hr tablet Take 1 tablet (600 mg total) by mouth 2 (two) times daily as needed for cough or to loosen phlegm. 30 tablet 0  . Homeopathic Products ([place]) Apply topically as needed.    Marland Kitchen ibuprofen (ADVIL) 600 MG tablet Take 1 tablet (600 mg total) by mouth every 8 (eight) hours as needed. 30 tablet 0  . lidocaine-prilocaine (EMLA) cream Apply small amount over port one (1) hour prior to appointment. 30 g 0  . montelukast (SINGULAIR) 10 MG tablet TAKE 1 TABLET BY MOUTH EVERY NIGHT AT BEDTIME 30 tablet 3  . omeprazole (PRILOSEC) 20 MG capsule TAKE 1 CAPSULE BY MOUTH ONCE DAILY 30 capsule 5  . oxybutynin (DITROPAN XL) 10 MG 24 hr tablet Take 1 tablet (10 mg total) by mouth at bedtime. 30 tablet 1  . Oxycodone HCl 10 MG TABS Take 1 tablet (10 mg total) by mouth every 8 (eight) hours as needed. for pain 120 tablet 0  . senna-docusate (SENOKOT-S) 8.6-50 MG tablet Take 1 tablet by mouth 2 (two) times daily. 60 tablet 5  . silver sulfADIAZINE  (SILVADENE) 1 % cream Apply 1 application topically daily. 50 g 0  . tamsulosin (FLOMAX) 0.4 MG CAPS capsule TAKE ONE CAPSULE BY MOUTH EVERY NIGHT ATBEDTIME 30 capsule 11  . traZODone (DESYREL) 100 MG tablet Take 3 tablets (300 mg total) by mouth at bedtime. TAKE 1 TABLET ( 100MG TOTAL ) BY MOUTH AT BEDTIME. 90 tablet 3  . triamcinolone cream (KENALOG) 0.1 % Apply 1 application topically 2 (two) times daily. 30 g 0  . zolendronic acid (ZOMETA) 4 MG/5ML injection Inject 4 mg into the vein every 8 (eight) weeks.     No current facility-administered medications for this visit.    ALLERGIES:  Allergies  Allergen Reactions  . Codeine Other (See Comments)    Headache  . Morphine And Related Other (See Comments)    Makes him feel weird  . Revlimid [Lenalidomide] Other (See Comments)    "Causes me to become weak"    PHYSICAL EXAM:  Performance status (ECOG): 1 - Symptomatic but completely ambulatory  There were no vitals filed for this visit. Wt Readings from Last 3 Encounters:  06/14/20 198 lb (89.8 kg)  05/28/20 198 lb 9.6 oz (90.1 kg)  05/23/20 197 lb (89.4 kg)   Physical Exam Vitals reviewed.  Constitutional:      Appearance: Normal appearance.  Cardiovascular:     Rate and Rhythm: Normal rate and regular rhythm.     Pulses: Normal pulses.     Heart sounds: Normal heart sounds.  Pulmonary:     Effort: Pulmonary effort is normal.     Breath sounds: Normal breath sounds.  Abdominal:     Palpations: Abdomen is soft. There is no hepatomegaly, splenomegaly or mass.     Tenderness: There is no abdominal tenderness.  Musculoskeletal:     Right lower leg: No edema.     Left lower leg: No edema.  Neurological:     General: No focal deficit present.     Mental Status: He is alert and oriented to person, place, and time.  Psychiatric:  Mood and Affect: Mood normal.        Behavior: Behavior normal.     LABORATORY DATA:  I have reviewed the labs as listed.  CBC Latest  Ref Rng & Units 05/28/2020 04/30/2020 04/02/2020  WBC 4.0 - 10.5 K/uL 4.7 5.7 8.9  Hemoglobin 13.0 - 17.0 g/dL 13.4 14.8 14.8  Hematocrit 39.0 - 52.0 % 40.1 44.8 45.0  Platelets 150 - 400 K/uL 121(L) 115(L) 141(L)   CMP Latest Ref Rng & Units 05/28/2020 04/30/2020 04/02/2020  Glucose 70 - 99 mg/dL 91 94 84  BUN 8 - 23 mg/dL _0 Creatinine 0.61 - 1.24 mg/dL 0.79 0.79 0.81  Sodium 135 - 145 mmol/L 140 140 142  Potassium 3.5 - 5.1 mmol/L 4.0 3.7 3.9  Chloride 98 - 111 mmol/L 104 104 105  CO2 22 - 32 mmol/L _1 Calcium 8.9 - 10.3 mg/dL 8.9 8.8(L) 8.9  Total Protein 6.5 - 8.1 g/dL 5.2(L) 6.0(L) 5.3(L)  Total Bilirubin 0.3 - 1.2 mg/dL 0.6 0.7 0.6  Alkaline Phos 38 - 126 U/L 19(L) 21(L) 21(L)  AST 15 - 41 U/L _2 ALT 0 - 44 U/L _3 DIAGNOSTIC IMAGING:  I have independently reviewed the scans and discussed with the patient. MR PELVIS W WO CONTRAST  Result Date: 06/06/2020 CLINICAL DATA:  Chronic low back pain radiating to both hips. History of multiple prior falls. History of multiple myeloma. EXAM: MRI PELVIS WITHOUT AND WITH CONTRAST TECHNIQUE: Multiplanar multisequence MR imaging of the pelvis was performed both before and after administration of intravenous contrast. CONTRAST:  10 mL GADAVIST IV SOLN COMPARISON:  MRI of the pelvis 01/15/2020 and 04/27/2019. FINDINGS: Bones/Joint/Cartilage Flattening of the superior margin of the right femoral head is again seen. Underlying marrow edema is improved. Remote left sacral and L3 and L4 fractures are unchanged. 1.1 cm enhancing lesion in the left sacrum is unchanged. No new focus of marrow signal abnormality is identified. Severe right hip cartilage loss and joint space narrowing are unchanged. Mild osteophytosis about the femoral heads is also unchanged. Ligaments Intact. Muscles and Tendons Intact. Soft tissues Marked prostatomegaly. IMPRESSION: Findings consistent with a healing subchondral fracture of the right femoral head. No  new fracture or other new bony abnormality is seen. Remote left sacral, L3 and L4 fractures appear unchanged. No change in a 1.1 cm enhancing lesion in the left sacrum. No change in right much worse than left hip osteoarthritis. Marked prostatomegaly. Electronically Signed   By: Inge Rise M.D.   On: 06/06/2020 21:24     ASSESSMENT:  1.Recurrent IgG kappa multiple myeloma: -RVD from 04/07/2014 through 07/28/2014 followed by maintenance Revlimid, which was discontinued. -MRI of the brain on 02/09/2017 showed right temporal lesion, status post radiation, PET/CT on January 2019 showed improvement in multiple myeloma with no new lesions. -Velcade changed from every other week on 05/06/2017 secondary to neuropathy. -MRI of the thoracic and lumbar spine on 09/03/2017 showed new rib lesion close to the spine on the left side, possibly eighth rib. There was soft tissue mass in that area. There is chronic L4 compression fracture which is stable. -Status post XRT to the left eighth rib lesion, 30 Gray in 10 fractions from 827 2019-9 12/2017 -DPD regimen started on 09/17/2017, developed erythematous maculopapular rash involving the trunk after taking 3 pills of pomalidomide. -Daratumumab restarted after radiation on 10/28/2017, Pomalyst completely discontinued on 02/12/2018 for recurrent rash. -Subcu daratumumab on 10/25/2018. He  takes dexamethasone 10 mg on days of treatment. -On monthly daratumumab and dexamethasone. -Revlimid 10 mg 3 weeks on 1 week offwas supposed to bestarted on 11/24/2019.Revlimid discontinued after first cycle in December secondary to rash. - He cannot tolerate Revlimid or Pomalyst due to rash.   PLAN:  1. IgG kappa myeloma: -He denies any falls from the last visit. - Burns on both ankles and legs have healed up completely. - Reviewed myeloma labs from 05/28/2020.  M spike is stable at 0.1.  Kappa light chains are 25.2 and ratio is 6.63 and stable. - Reviewed CBC which showed  mild thrombocytopenia which is stable.  LFTs are grossly normal. - He lost about 5 pounds in the last 4 weeks despite appetite being very good.  We will closely monitor. - Proceed with treatment today.  RTC 4 weeks for follow-up.  2. Low back pain: -Continue oxycodone 10 mg 2 to 3 tablets daily.  He takes ibuprofen as needed..  3. Peripheral neuropathy: -Continue gabapentin twice daily.  4. Bone protection: -Continue Zometa every 8 weeks.  5. Sleeping difficulty: -Continue trazodone 300 mg and Xanax 2 tablets at bedtime.  6. Hypokalemia: -Continue potassium daily.  Potassium today is 3.8.   Orders placed this encounter:  No orders of the defined types were placed in this encounter.    Derek Jack, MD [facility] [phone]   I, Thana Ates, am acting as a scribe for Dr. Derek Jack.  I, Derek Jack MD, have reviewed the above documentation for accuracy and completeness, and I agree with the above.

## 2020-06-25 ENCOUNTER — Inpatient Hospital Stay (HOSPITAL_BASED_OUTPATIENT_CLINIC_OR_DEPARTMENT_OTHER): Payer: Medicare Other | Admitting: Hematology

## 2020-06-25 ENCOUNTER — Inpatient Hospital Stay (HOSPITAL_COMMUNITY): Payer: Medicare Other

## 2020-06-25 ENCOUNTER — Other Ambulatory Visit: Payer: Self-pay

## 2020-06-25 ENCOUNTER — Other Ambulatory Visit (HOSPITAL_COMMUNITY): Payer: Self-pay

## 2020-06-25 VITALS — BP 117/76 | HR 61 | Temp 96.8°F | Resp 18 | Wt 192.2 lb

## 2020-06-25 DIAGNOSIS — C9000 Multiple myeloma not having achieved remission: Secondary | ICD-10-CM

## 2020-06-25 DIAGNOSIS — Z79899 Other long term (current) drug therapy: Secondary | ICD-10-CM | POA: Diagnosis not present

## 2020-06-25 DIAGNOSIS — Z5112 Encounter for antineoplastic immunotherapy: Secondary | ICD-10-CM | POA: Diagnosis not present

## 2020-06-25 LAB — CBC WITH DIFFERENTIAL/PLATELET
Abs Immature Granulocytes: 0 10*3/uL (ref 0.00–0.07)
Basophils Absolute: 0 10*3/uL (ref 0.0–0.1)
Basophils Relative: 0 %
Eosinophils Absolute: 0 10*3/uL (ref 0.0–0.5)
Eosinophils Relative: 1 %
HCT: 41.8 % (ref 39.0–52.0)
Hemoglobin: 13.8 g/dL (ref 13.0–17.0)
Immature Granulocytes: 0 %
Lymphocytes Relative: 31 %
Lymphs Abs: 1.5 10*3/uL (ref 0.7–4.0)
MCH: 31.7 pg (ref 26.0–34.0)
MCHC: 33 g/dL (ref 30.0–36.0)
MCV: 95.9 fL (ref 80.0–100.0)
Monocytes Absolute: 0.5 10*3/uL (ref 0.1–1.0)
Monocytes Relative: 9 %
Neutro Abs: 2.9 10*3/uL (ref 1.7–7.7)
Neutrophils Relative %: 59 %
Platelets: 122 10*3/uL — ABNORMAL LOW (ref 150–400)
RBC: 4.36 MIL/uL (ref 4.22–5.81)
RDW: 13.6 % (ref 11.5–15.5)
WBC: 4.9 10*3/uL (ref 4.0–10.5)
nRBC: 0 % (ref 0.0–0.2)

## 2020-06-25 LAB — COMPREHENSIVE METABOLIC PANEL
ALT: 11 U/L (ref 0–44)
AST: 14 U/L — ABNORMAL LOW (ref 15–41)
Albumin: 3.7 g/dL (ref 3.5–5.0)
Alkaline Phosphatase: 20 U/L — ABNORMAL LOW (ref 38–126)
Anion gap: 6 (ref 5–15)
BUN: 25 mg/dL — ABNORMAL HIGH (ref 8–23)
CO2: 27 mmol/L (ref 22–32)
Calcium: 8.3 mg/dL — ABNORMAL LOW (ref 8.9–10.3)
Chloride: 105 mmol/L (ref 98–111)
Creatinine, Ser: 0.88 mg/dL (ref 0.61–1.24)
GFR, Estimated: >60 mL/min (ref >60)
Glucose, Bld: 91 mg/dL (ref 70–99)
Potassium: 3.8 mmol/L (ref 3.5–5.1)
Sodium: 138 mmol/L (ref 135–145)
Total Bilirubin: 0.7 mg/dL (ref 0.3–1.2)
Total Protein: 5.4 g/dL — ABNORMAL LOW (ref 6.5–8.1)

## 2020-06-25 LAB — MAGNESIUM: Magnesium: 2 mg/dL (ref 1.7–2.4)

## 2020-06-25 MED ORDER — HEPARIN SOD (PORK) LOCK FLUSH 100 UNIT/ML IV SOLN
500.0000 [IU] | Freq: Once | INTRAVENOUS | Status: AC
  Administered 2020-06-25: 500 [IU] via INTRAVENOUS

## 2020-06-25 MED ORDER — ACETAMINOPHEN 325 MG PO TABS
ORAL_TABLET | ORAL | Status: AC
  Filled 2020-06-25: qty 2

## 2020-06-25 MED ORDER — DARATUMUMAB-HYALURONIDASE-FIHJ 1800-30000 MG-UT/15ML ~~LOC~~ SOLN
1800.0000 mg | Freq: Once | SUBCUTANEOUS | Status: AC
  Administered 2020-06-25: 1800 mg via SUBCUTANEOUS
  Filled 2020-06-25: qty 15

## 2020-06-25 MED ORDER — PROCHLORPERAZINE MALEATE 10 MG PO TABS
10.0000 mg | ORAL_TABLET | Freq: Once | ORAL | Status: AC
Start: 2020-06-25 — End: 2020-06-25
  Administered 2020-06-25: 10 mg via ORAL

## 2020-06-25 MED ORDER — ACETAMINOPHEN 325 MG PO TABS
650.0000 mg | ORAL_TABLET | Freq: Once | ORAL | Status: AC
Start: 2020-06-25 — End: 2020-06-25
  Administered 2020-06-25: 650 mg via ORAL

## 2020-06-25 MED ORDER — PROCHLORPERAZINE MALEATE 10 MG PO TABS
ORAL_TABLET | ORAL | Status: AC
  Filled 2020-06-25: qty 1

## 2020-06-25 MED ORDER — DEXAMETHASONE 4 MG PO TABS
20.0000 mg | ORAL_TABLET | Freq: Once | ORAL | Status: AC
  Administered 2020-06-25: 20 mg via ORAL

## 2020-06-25 MED ORDER — DIPHENHYDRAMINE HCL 25 MG PO CAPS
ORAL_CAPSULE | ORAL | Status: AC
  Filled 2020-06-25: qty 2

## 2020-06-25 MED ORDER — DEXAMETHASONE 4 MG PO TABS
ORAL_TABLET | ORAL | Status: AC
  Filled 2020-06-25: qty 5

## 2020-06-25 MED ORDER — SODIUM CHLORIDE 0.9% FLUSH
10.0000 mL | INTRAVENOUS | Status: DC | PRN
  Administered 2020-06-25: 10 mL via INTRAVENOUS

## 2020-06-25 MED ORDER — DIPHENHYDRAMINE HCL 25 MG PO CAPS
50.0000 mg | ORAL_CAPSULE | Freq: Once | ORAL | Status: AC
  Administered 2020-06-25: 50 mg via ORAL

## 2020-06-25 NOTE — Progress Notes (Signed)
Patients port flushed without difficulty.  Good blood return noted with no bruising or swelling noted at site.  Transparent dressing applied.  Patient remains accessed for chemotherapy treatment.  

## 2020-06-25 NOTE — Patient Instructions (Signed)
[facility]  Discharge Instructions: Thank you for choosing [facility] to provide your oncology and hematology care.  If you have a lab appointment with the Cancer Center, please come in thru the Main Entrance and check in at the main information desk.  Wear comfortable clothing and clothing appropriate for easy access to any Portacath or PICC line.   We strive to give you quality time with your provider. You may need to reschedule your appointment if you arrive late (15 or more minutes).  Arriving late affects you and other patients whose appointments are after yours.  Also, if you miss three or more appointments without notifying the office, you may be dismissed from the clinic at the provider's discretion.      For prescription refill requests, have your pharmacy contact our office and allow 72 hours for refills to be completed.    Today you received the following chemotherapy and/or immunotherapy agents Darzalex FASPRO [place]    To help prevent nausea and vomiting after your treatment, we encourage you to take your nausea medication as directed.  BELOW ARE SYMPTOMS THAT SHOULD BE REPORTED IMMEDIATELY: . *FEVER GREATER THAN 100.4 F (38 C) OR HIGHER . *CHILLS OR SWEATING . *NAUSEA AND VOMITING THAT IS NOT CONTROLLED WITH YOUR NAUSEA MEDICATION . *UNUSUAL SHORTNESS OF BREATH . *UNUSUAL BRUISING OR BLEEDING . *URINARY PROBLEMS (pain or burning when urinating, or frequent urination) . *BOWEL PROBLEMS (unusual diarrhea, constipation, pain near the anus) . TENDERNESS IN MOUTH AND THROAT WITH OR WITHOUT PRESENCE OF ULCERS (sore throat, sores in mouth, or a toothache) . UNUSUAL RASH, SWELLING OR PAIN  . UNUSUAL VAGINAL DISCHARGE OR ITCHING   Items with * indicate a potential emergency and should be followed up as soon as possible or go to the Emergency Department if any problems should occur.  Please show the CHEMOTHERAPY ALERT CARD or IMMUNOTHERAPY ALERT CARD at  check-in to the Emergency Department and triage nurse.  Should you have questions after your visit or need to cancel or reschedule your appointment, please contact [facility] [phone]  and follow the prompts.  Office hours are 8:00 a.m. to 4:30 p.m. Monday - Friday. Please note that voicemails left after 4:00 p.m. may not be returned until the following business day.  We are closed weekends and major holidays. You have access to a nurse at all times for urgent questions. Please call the main number to the clinic [phone] and follow the prompts.  For any non-urgent questions, you may also contact your provider using MyChart. We now offer e-Visits for anyone 18 and older to request care online for non-urgent symptoms. For details visit [email or web address].   Also download the MyChart app! Go to the app store, search "MyChart", open the app, select [facility], and log in with your MyChart username and password.  Due to Covid, a mask is required upon entering the hospital/clinic. If you do not have a mask, one will be given to you upon arrival. For doctor visits, patients may have 1 support person aged 18 or older with them. For treatment visits, patients cannot have anyone with them due to current Covid guidelines and our immunocompromised population.  

## 2020-06-25 NOTE — Progress Notes (Signed)
Patient was assessed by Dr. Katragadda and labs have been reviewed.  Patient is okay to proceed with treatment today. Primary RN and pharmacy aware.   

## 2020-06-25 NOTE — Progress Notes (Signed)
Patient presents today for treatment and follow up visit with Dr. Delton Coombes. Labs pending. Vital signs within parameters for treatment.   Message received from [place] / Dr. Delton Coombes to proceed with treatment. Labs reviewed and within parameters for today's treatment. No Zometa today. Patient receives Zometa every 8 weeks instead of every 28 days per supportive therapy plan  CJones RPH.   Treatment given today per MD orders. Tolerated  without adverse affects. Vital signs stable. No complaints at this time. Discharged from clinic via wheel chair in stable condition. Alert and oriented x 3. F/U with [facility] as scheduled.

## 2020-06-25 NOTE — Patient Instructions (Signed)
[facility] at [facility] Discharge Instructions  You were seen today by Dr. Katragadda. He went over your recent results, and you received treatment. Dr. Katragadda will see you back in 4 weeks for labs and follow up.   Thank you for choosing [facility] at [facility] to provide your oncology and hematology care.  To afford each patient quality time with our provider, please arrive at least 15 minutes before your scheduled appointment time.   If you have a lab appointment with the Cancer Center please come in thru the Main Entrance and check in at the main information desk  You need to re-schedule your appointment should you arrive 10 or more minutes late.  We strive to give you quality time with our providers, and arriving late affects you and other patients whose appointments are after yours.  Also, if you no show three or more times for appointments you may be dismissed from the clinic at the providers discretion.     Again, thank you for choosing [facility].  Our hope is that these requests will decrease the amount of time that you wait before being seen by our physicians.       _____________________________________________________________  Should you have questions after your visit to  Cancer Center, please contact our office at [phone] between the hours of 8:00 a.m. and 4:30 p.m.  Voicemails left after 4:00 p.m. will not be returned until the following business day.  For prescription refill requests, have your pharmacy contact our office and allow 72 hours.    Cancer Center Support Programs:   > Cancer Support Group  2nd Tuesday of the month 1pm-2pm, Journey Room    

## 2020-06-26 LAB — KAPPA/LAMBDA LIGHT CHAINS
Kappa free light chain: 24.8 mg/L — ABNORMAL HIGH (ref 3.3–19.4)
Kappa, lambda light chain ratio: 6.36 — ABNORMAL HIGH (ref 0.26–1.65)
Lambda free light chains: 3.9 mg/L — ABNORMAL LOW (ref 5.7–26.3)

## 2020-06-27 LAB — PROTEIN ELECTROPHORESIS, SERUM
A/G Ratio: 1.9 — ABNORMAL HIGH (ref 0.7–1.7)
Albumin ELP: 3.5 g/dL (ref 2.9–4.4)
Alpha-1-Globulin: 0.2 g/dL (ref 0.0–0.4)
Alpha-2-Globulin: 0.6 g/dL (ref 0.4–1.0)
Beta Globulin: 0.7 g/dL (ref 0.7–1.3)
Gamma Globulin: 0.3 g/dL — ABNORMAL LOW (ref 0.4–1.8)
Globulin, Total: 1.8 g/dL — ABNORMAL LOW (ref 2.2–3.9)
M-Spike, %: 0.2 g/dL — ABNORMAL HIGH
Total Protein ELP: 5.3 g/dL — ABNORMAL LOW (ref 6.0–8.5)

## 2020-07-23 ENCOUNTER — Other Ambulatory Visit: Payer: Self-pay

## 2020-07-23 ENCOUNTER — Inpatient Hospital Stay (HOSPITAL_COMMUNITY): Payer: Medicare Other | Attending: Hematology and Oncology

## 2020-07-23 ENCOUNTER — Ambulatory Visit (HOSPITAL_COMMUNITY): Payer: Medicare Other | Admitting: Hematology

## 2020-07-23 ENCOUNTER — Inpatient Hospital Stay (HOSPITAL_COMMUNITY): Payer: Medicare Other

## 2020-07-23 DIAGNOSIS — C9000 Multiple myeloma not having achieved remission: Secondary | ICD-10-CM | POA: Insufficient documentation

## 2020-07-23 DIAGNOSIS — Z79899 Other long term (current) drug therapy: Secondary | ICD-10-CM | POA: Insufficient documentation

## 2020-07-23 DIAGNOSIS — Z5112 Encounter for antineoplastic immunotherapy: Secondary | ICD-10-CM | POA: Diagnosis not present

## 2020-07-23 LAB — COMPREHENSIVE METABOLIC PANEL
ALT: 11 U/L (ref 0–44)
AST: 18 U/L (ref 15–41)
Albumin: 3.8 g/dL (ref 3.5–5.0)
Alkaline Phosphatase: 21 U/L — ABNORMAL LOW (ref 38–126)
Anion gap: 11 (ref 5–15)
BUN: 22 mg/dL (ref 8–23)
CO2: 25 mmol/L (ref 22–32)
Calcium: 8.6 mg/dL — ABNORMAL LOW (ref 8.9–10.3)
Chloride: 103 mmol/L (ref 98–111)
Creatinine, Ser: 0.69 mg/dL (ref 0.61–1.24)
GFR, Estimated: >60 mL/min (ref >60)
Glucose, Bld: 144 mg/dL — ABNORMAL HIGH (ref 70–99)
Potassium: 3.3 mmol/L — ABNORMAL LOW (ref 3.5–5.1)
Sodium: 139 mmol/L (ref 135–145)
Total Bilirubin: 1.1 mg/dL (ref 0.3–1.2)
Total Protein: 5.7 g/dL — ABNORMAL LOW (ref 6.5–8.1)

## 2020-07-23 LAB — CBC WITH DIFFERENTIAL/PLATELET
Abs Immature Granulocytes: 0.01 10*3/uL (ref 0.00–0.07)
Basophils Absolute: 0 10*3/uL (ref 0.0–0.1)
Basophils Relative: 0 %
Eosinophils Absolute: 0 10*3/uL (ref 0.0–0.5)
Eosinophils Relative: 1 %
HCT: 42.8 % (ref 39.0–52.0)
Hemoglobin: 14.7 g/dL (ref 13.0–17.0)
Immature Granulocytes: 0 %
Lymphocytes Relative: 25 %
Lymphs Abs: 1.5 10*3/uL (ref 0.7–4.0)
MCH: 32 pg (ref 26.0–34.0)
MCHC: 34.3 g/dL (ref 30.0–36.0)
MCV: 93 fL (ref 80.0–100.0)
Monocytes Absolute: 0.5 10*3/uL (ref 0.1–1.0)
Monocytes Relative: 9 %
Neutro Abs: 3.9 10*3/uL (ref 1.7–7.7)
Neutrophils Relative %: 65 %
Platelets: 138 10*3/uL — ABNORMAL LOW (ref 150–400)
RBC: 4.6 MIL/uL (ref 4.22–5.81)
RDW: 13.2 % (ref 11.5–15.5)
WBC: 5.9 10*3/uL (ref 4.0–10.5)
nRBC: 0 % (ref 0.0–0.2)

## 2020-07-23 LAB — LACTATE DEHYDROGENASE: LDH: 182 U/L (ref 98–192)

## 2020-07-23 MED ORDER — HEPARIN SOD (PORK) LOCK FLUSH 100 UNIT/ML IV SOLN
500.0000 [IU] | Freq: Once | INTRAVENOUS | Status: AC | PRN
  Administered 2020-07-23: 500 [IU]

## 2020-07-23 MED ORDER — DEXAMETHASONE 4 MG PO TABS
20.0000 mg | ORAL_TABLET | Freq: Once | ORAL | Status: AC
  Administered 2020-07-23: 20 mg via ORAL
  Filled 2020-07-23: qty 5

## 2020-07-23 MED ORDER — PROCHLORPERAZINE MALEATE 10 MG PO TABS
10.0000 mg | ORAL_TABLET | Freq: Once | ORAL | Status: AC
Start: 2020-07-23 — End: 2020-07-23
  Administered 2020-07-23: 10 mg via ORAL
  Filled 2020-07-23: qty 1

## 2020-07-23 MED ORDER — DARATUMUMAB-HYALURONIDASE-FIHJ 1800-30000 MG-UT/15ML ~~LOC~~ SOLN
1800.0000 mg | Freq: Once | SUBCUTANEOUS | Status: AC
  Administered 2020-07-23: 1800 mg via SUBCUTANEOUS
  Filled 2020-07-23: qty 15

## 2020-07-23 MED ORDER — SODIUM CHLORIDE 0.9 % IV SOLN: Freq: Once | INTRAVENOUS | Status: AC

## 2020-07-23 MED ORDER — ZOLEDRONIC ACID 4 MG/100ML IV SOLN
4.0000 mg | Freq: Once | INTRAVENOUS | Status: AC
  Administered 2020-07-23: 4 mg via INTRAVENOUS
  Filled 2020-07-23: qty 100

## 2020-07-23 MED ORDER — ACETAMINOPHEN 325 MG PO TABS
650.0000 mg | ORAL_TABLET | Freq: Once | ORAL | Status: AC
Start: 2020-07-23 — End: 2020-07-23
  Administered 2020-07-23: 650 mg via ORAL
  Filled 2020-07-23: qty 2

## 2020-07-23 MED ORDER — DIPHENHYDRAMINE HCL 25 MG PO CAPS
50.0000 mg | ORAL_CAPSULE | Freq: Once | ORAL | Status: AC
Start: 2020-07-23 — End: 2020-07-23
  Administered 2020-07-23: 50 mg via ORAL
  Filled 2020-07-23: qty 2

## 2020-07-23 MED ORDER — SODIUM CHLORIDE 0.9% FLUSH
10.0000 mL | INTRAVENOUS | Status: DC | PRN
  Administered 2020-07-23: 10 mL

## 2020-07-23 NOTE — Progress Notes (Signed)
Pt here for Zometa and Dara.  Pt has lost 8 lbs since last treatment but states his appetite is good and he feels the best today that he has felt in awhile. Potassium 3.3, Calcium 8.6.  Spoke with Dr Raliegh Ip and he is okay for treatment today.   Tolerated treatment well today without incidence.  Vital signs WNL.  AVS reviewed.  Stable during and after treatment.  Discharged in stable condition ambulatory with cane.

## 2020-07-23 NOTE — Patient Instructions (Signed)
[facility]  Discharge Instructions: Thank you for choosing [facility] to provide your oncology and hematology care.  If you have a lab appointment with the [facility], please come in thru the Main Entrance and check in at the main information desk.  Wear comfortable clothing and clothing appropriate for easy access to any Portacath or PICC line.   We strive to give you quality time with your provider. You may need to reschedule your appointment if you arrive late (15 or more minutes).  Arriving late affects you and other patients whose appointments are after yours.  Also, if you miss three or more appointments without notifying the office, you may be dismissed from the clinic at the provider's discretion.      For prescription refill requests, have your pharmacy contact our office and allow 72 hours for refills to be completed.    Today you received the following chemotherapy and/or immunotherapy agents dara [place] and zometa      To help prevent nausea and vomiting after your treatment, we encourage you to take your nausea medication as directed.  BELOW ARE SYMPTOMS THAT SHOULD BE REPORTED IMMEDIATELY: *FEVER GREATER THAN 100.4 F (38 C) OR HIGHER *CHILLS OR SWEATING *NAUSEA AND VOMITING THAT IS NOT CONTROLLED WITH YOUR NAUSEA MEDICATION *UNUSUAL SHORTNESS OF BREATH *UNUSUAL BRUISING OR BLEEDING *URINARY PROBLEMS (pain or burning when urinating, or frequent urination) *BOWEL PROBLEMS (unusual diarrhea, constipation, pain near the anus) TENDERNESS IN MOUTH AND THROAT WITH OR WITHOUT PRESENCE OF ULCERS (sore throat, sores in mouth, or a toothache) UNUSUAL RASH, SWELLING OR PAIN  UNUSUAL VAGINAL DISCHARGE OR ITCHING   Items with * indicate a potential emergency and should be followed up as soon as possible or go to the Emergency Department if any problems should occur.  Please show the CHEMOTHERAPY ALERT CARD or IMMUNOTHERAPY ALERT CARD at check-in to the  Emergency Department and triage nurse.  Should you have questions after your visit or need to cancel or reschedule your appointment, please contact [facility] [phone]  and follow the prompts.  Office hours are 8:00 a.m. to 4:30 p.m. Monday - Friday. Please note that voicemails left after 4:00 p.m. may not be returned until the following business day.  We are closed weekends and major holidays. You have access to a nurse at all times for urgent questions. Please call the main number to the clinic [phone] and follow the prompts.  For any non-urgent questions, you may also contact your provider using MyChart. We now offer e-Visits for anyone 30 and older to request care online for non-urgent symptoms. For details visit [email or web address].   Also download the MyChart app! Go to the app store, search "MyChart", open the app, select [facility], and log in with your MyChart username and password.  Due to Covid, a mask is required upon entering the hospital/clinic. If you do not have a mask, one will be given to you upon arrival. For doctor visits, patients may have 1 support person aged 7 or older with them. For treatment visits, patients cannot have anyone with them due to current Covid guidelines and our immunocompromised population.   Daratumumab; Hyaluronidase Injection What is this medication? DARATUMUMAB; HYALURONIDASE (dar a toom ue mab / hye al ur ON i dase) is a monoclonal antibody. Hyaluronidase is used to improve the effects of daratumumab. It treats certain types of cancer. Some of the cancers treated aremultiple myeloma and light-chain amyloidosis. This medicine may be used  for other purposes; ask your health care provider orpharmacist if you have questions. COMMON BRAND NAME(S): DARZALEX FASPRO What should I tell my care team before I take this medication? They need to know if you have any of these conditions: heart disease infection especially a viral infection  such as chickenpox, cold sores, herpes, or hepatitis B lung or breathing disease an unusual or allergic reaction to daratumumab, hyaluronidase, other medicines, foods, dyes, or preservatives pregnant or trying to get pregnant breast-feeding How should I use this medication? This medicine is for injection under the skin. It is given by a health careprofessional in a hospital or clinic setting. Talk to your pediatrician regarding the use of this medicine in children.Special care may be needed. Overdosage: If you think you have taken too much of this medicine contact apoison control center or emergency room at once. NOTE: This medicine is only for you. Do not share this medicine with others. What if I miss a dose? Keep appointments for follow-up doses as directed. It is important not to miss your dose. Call your doctor or health care professional if you are unable tokeep an appointment. What may interact with this medication? Interactions have not been studied. This list may not describe all possible interactions. Give your health care provider a list of all the medicines, herbs, non-prescription drugs, or dietary supplements you use. Also tell them if you smoke, drink alcohol, or use illegaldrugs. Some items may interact with your medicine. What should I watch for while using this medication? Your condition will be monitored carefully while you are receiving thismedicine. This medicine can cause serious allergic reactions. To reduce your risk, your health care provider may give you other medicine to take before receiving thisone. Be sure to follow the directions from your health care provider. This medicine can affect the results of blood tests to match your blood type. These changes can last for up to 6 months after the final dose. Your healthcare provider will do blood tests to match your blood type before you start treatment. Tell all of your healthcare providers that you are being treatedwith  this medicine before receiving a blood transfusion. This medicine can affect the results of some tests used to determine treatmentresponse; extra tests may be needed to evaluate response. Do not become pregnant while taking this medicine or for 3 months after stopping it. Women should inform their health care provider if they wish to become pregnant or think they might be pregnant. There is a potential for serious side effects to an unborn child. Talk to your health care provider formore information. Do not breast-feed an infant while taking this medicine. What side effects may I notice from receiving this medication? Side effects that you should report to your doctor or health care professionalas soon as possible: allergic reactions like skin rash, itching or hives, swelling of the face, lips, or tongue blurred vision breathing problems chest pain cough dizziness fast heartbeat feeling faint or lightheaded headache low blood counts - this medicine may decrease the number of white blood cells, red blood cells and platelets. You may be at increased risk for infections and bleeding. nausea, vomiting shortness of breath signs of decreased platelets or bleeding - bruising, pinpoint red spots on the skin, black, tarry stools, blood in the urine signs of decreased red blood cells - unusually weak or tired, feeling faint or lightheaded, falls signs of infection - fever or chills, cough, sore throat, pain or difficulty passing urine Side effects that usually  do not require medical attention (report these toyour doctor or health care professional if they continue or are bothersome): back pain constipation diarrhea pain, tingling, numbness in the hands or feet muscle cramps swelling of the ankles, feet, hands tiredness trouble sleeping This list may not describe all possible side effects. Call your doctor for medical advice about side effects. You may report side effects to FDA  at1-800-FDA-1088. Where should I keep my medication? This drug is given in a hospital or clinic and will not be stored at home. NOTE: This sheet is a summary. It may not cover all possible information. If you have questions about this medicine, talk to your doctor, pharmacist, orhealth care provider.  2022 Elsevier/Gold Standard (2020-03-01 12:51:54)  Zoledronic Acid Injection (Hypercalcemia, Oncology) What is this medication? ZOLEDRONIC ACID (ZOE le dron ik AS id) slows calcium loss from bones. It high calcium levels in the blood from some kinds of cancer. It may be used in otherpeople at risk for bone loss. This medicine may be used for other purposes; ask your health care provider orpharmacist if you have questions. COMMON BRAND NAME(S): Zometa What should I tell my care team before I take this medication? They need to know if you have any of these conditions: cancer dehydration dental disease kidney disease liver disease low levels of calcium in the blood lung or breathing disease (asthma) receiving steroids like dexamethasone or prednisone an unusual or allergic reaction to zoledronic acid, other medicines, foods, dyes, or preservatives pregnant or trying to get pregnant breast-feeding How should I use this medication? This drug is injected into a vein. It is given by a health care provider in [place] or clinic setting. Talk to your health care provider about the use of this drug in children.Special care may be needed. Overdosage: If you think you have taken too much of this medicine contact apoison control center or emergency room at once. NOTE: This medicine is only for you. Do not share this medicine with others. What if I miss a dose? Keep appointments for follow-up doses. It is important not to miss your dose.Call your health care provider if you are unable to keep an appointment. What may interact with this medication? certain antibiotics given by injection NSAIDs,  medicines for pain and inflammation, like ibuprofen or naproxen some diuretics like bumetanide, furosemide teriparatide thalidomide This list may not describe all possible interactions. Give your health care provider a list of all the medicines, herbs, non-prescription drugs, or dietary supplements you use. Also tell them if you smoke, drink alcohol, or use illegaldrugs. Some items may interact with your medicine. What should I watch for while using this medication? Visit your health care provider for regular checks on your progress. It may besome time before you see the benefit from this drug. Some people who take this drug have severe bone, joint, or muscle pain. This drug may also increase your risk for jaw problems or a broken thigh bone. Tell your health care provider right away if you have severe pain in your jaw, bones, joints, or muscles. Tell you health care provider if you have any painthat does not go away or that gets worse. Tell your dentist and dental surgeon that you are taking this drug. You should not have major dental surgery while on this drug. See your dentist to have a dental exam and fix any dental problems before starting this drug. Take good care of your teeth while on this drug. Make sure you see your dentist  forregular follow-up appointments. You should make sure you get enough calcium and vitamin D while you are taking this drug. Discuss the foods you eat and the vitamins you take with your healthcare provider. Check with your health care provider if you have severe diarrhea, nausea, and vomiting, or if you sweat a lot. The loss of too much body fluid may make itdangerous for you to take this drug. You may need blood work done while you are taking this drug. Do not become pregnant while taking this drug. Women should inform their health care provider if they wish to become pregnant or think they might be pregnant. There is potential for serious harm to an unborn child. Talk to  your healthcare provider for more information. What side effects may I notice from receiving this medication? Side effects that you should report to your doctor or health care provider assoon as possible: allergic reactions (skin rash, itching or hives; swelling of the face, lips, or tongue) bone pain infection (fever, chills, cough, sore throat, pain or trouble passing urine) jaw pain, especially after dental work joint pain kidney injury (trouble passing urine or change in the amount of urine) low blood pressure (dizziness; feeling faint or lightheaded, falls; unusually weak or tired) low calcium levels (fast heartbeat; muscle cramps or pain; pain, tingling, or numbness in the hands or feet; seizures) low magnesium levels (fast, irregular heartbeat; muscle cramp or pain; muscle weakness; tremors; seizures) low red blood cell counts (trouble breathing; feeling faint; lightheaded, falls; unusually weak or tired) muscle pain redness, blistering, peeling, or loosening of the skin, including inside the mouth severe diarrhea swelling of the ankles, feet, hands trouble breathing Side effects that usually do not require medical attention (report to yourdoctor or health care provider if they continue or are bothersome): anxious constipation coughing depressed mood eye irritation, itching, or pain fever general ill feeling or flu-like symptoms nausea pain, redness, or irritation at site where injected trouble sleeping This list may not describe all possible side effects. Call your doctor for medical advice about side effects. You may report side effects to FDA at1-800-FDA-1088. Where should I keep my medication? This drug is given in a hospital or clinic. It will not be stored at home. NOTE: This sheet is a summary. It may not cover all possible information. If you have questions about this medicine, talk to your doctor, pharmacist, orhealth care provider.  2022 Elsevier/Gold Standard  (2018-11-04 09:13:00)

## 2020-07-25 LAB — PROTEIN ELECTROPHORESIS, SERUM
A/G Ratio: 1.9 — ABNORMAL HIGH (ref 0.7–1.7)
Albumin ELP: 3.7 g/dL (ref 2.9–4.4)
Alpha-1-Globulin: 0.3 g/dL (ref 0.0–0.4)
Alpha-2-Globulin: 0.6 g/dL (ref 0.4–1.0)
Beta Globulin: 0.7 g/dL (ref 0.7–1.3)
Gamma Globulin: 0.3 g/dL — ABNORMAL LOW (ref 0.4–1.8)
Globulin, Total: 1.9 g/dL — ABNORMAL LOW (ref 2.2–3.9)
M-Spike, %: 0.2 g/dL — ABNORMAL HIGH
Total Protein ELP: 5.6 g/dL — ABNORMAL LOW (ref 6.0–8.5)

## 2020-08-14 ENCOUNTER — Other Ambulatory Visit: Payer: Self-pay | Admitting: Family Medicine

## 2020-08-14 NOTE — Telephone Encounter (Signed)
Last refill until pt est care with new pcp

## 2020-08-15 NOTE — Telephone Encounter (Signed)
Patient is current patient of Dr. Dennard Schaumann.   Refill appropriate.

## 2020-08-18 NOTE — Progress Notes (Signed)
[place], [place] [address]   CLINIC:  Medical Oncology/Hematology  PCP:  [name], MD [address] [place] [address] [phone]   REASON FOR VISIT:  Follow-up for IgG kappa multiple myeloma  PRIOR THERAPY: Velcade x 6 cycles from 04/07/2014 to 07/28/2014; x 7 cycles from 02/18/2017 to 08/26/2017  NGS Results: not done  CURRENT THERAPY: Darzalex Faspro every 4 weeks  BRIEF ONCOLOGIC HISTORY:  Oncology History  Multiple myeloma without remission ([facility])  11/16/2012 Initial Diagnosis   L occipital condyle destructive lesion, not biopsied secondary to location. XRT    12/01/2012 Imaging   L3 superior endplate compression FX, no lytic lesions to suggest myeloma    02/06/2014 Imaging   soft tissue mass along the right posterior fifth rib with some rib destruction    02/16/2014 Miscellaneous   Normal CBC, Normal CMP, kappa,lamda ratio of 2.62 (abnormal), UIEP with slightly restricted band, IgG kappa, SPEP/IEP with monoclonal protein at 0.79 g/dl, normal igG, suppressed IGM    02/20/2014 Bone Marrow Biopsy   33% kappa restricted plasma cells Normal FISH, normal cytogenetics    02/21/2014 - 03/08/2014 Radiation Therapy   30Gy to R rib lesion, lesion not biopsied    03/16/2014 PET scan   Scattered hypermetabolic osseous lesions, including the calvarium, ribs, left scapula, sacrum, and left femoral shaft. An expansile left vertebral body lesion at L3 extends into the epidural space of the left lateral recess,     03/17/2014 - 04/07/2014 Chemotherapy   Velcade and Dexamethasone due to initial denial of Revlimid by insurance company.  Zometa monthly.    03/22/2014 Imaging   MR_ L-Spine- Enhancing lesions compatible with multiple myeloma at L2, L3, and S1-2.    04/07/2014 - 07/28/2014 Chemotherapy   RVD with Revlimid at 25 mg days 1-14.  Revlimid dose reduced to 25 mg every other day x 14 days with 7 day respite  beginning on day 1 of cycle 2.    04/17/2014 Adverse Reaction   Revlimid-induced rash.  Treated with steroids.    07/28/2014 - 08/04/2014 Chemotherapy   Revlimid daily    08/04/2014 Adverse Reaction   Velcade-induced peripheral neuropathy    08/04/2014 Treatment Plan Change   D/C Velcade    08/11/2014 PET scan   Response to therapy. Improvement and resolution of foci of osseous hypermetabolism.    08/22/2014 - 08/28/2014 Chemotherapy   Revlimid 25 mg days 1-21 every 28 days    08/28/2014 Adverse Reaction   Revlimid-induced rash. Medication held.  Medrol dose Pak prescribed.    09/04/2014 - 01/08/2015 Chemotherapy   Revlimid 10 mg every other day, without dexamethasone    01/08/2015 - 01/15/2015 Hospital Admission   Colonic diverticular abscess with IR drain placement    03/02/2015 PET scan   Only bony uptake is low activity at site of deformity/callus at R second rib FX, high activity in sigmoid colon, advanced sigmoid divertidulosis. Abscess not resolved?    06/04/2015 Imaging   CT nonobstructive L renal calculus, diverticulosis of descending and sigmoid colon without inflammation, moderate prostatic enlargement    07/12/2015 Surgery   Diverticulitis s/p robotic sigmoid colectomy with Dr. Johney Maine    08/23/2015 PET scan   Primarily similar hypermetabolic osseous foci within the R sided ribs. new L third rib focus of hypermetab and sclerosis is favored to be related to healing FX. no soft tissue myeloma id'd. Presumed postop hypermetab and edema about the R pelvic  wall    03/06/2016 PET scan   1. Reduced activity in the prior lesion such as the right second and fifth rib lesions, activity nearly completely resolved and significantly less than added mediastinal blood pool activity. No new lesions are identified. 2. Other imaging findings of potential clinical significance: Coronary, aortic arch, and branch vessel atherosclerotic vascular disease. Aortoiliac atherosclerotic vascular  disease. Enlarged prostate gland. Colonic diverticula.    02/17/2017 PET scan   HEAD/NECK: No hypermetabolic activity in the scalp. No hypermetabolic cervical lymph nodes. CHEST: No hypermetabolic mediastinal or hilar nodes. Right upper lobe scarring/atelectasis. No suspicious pulmonary nodules on the CT scan. ABDOMEN/PELVIS: No abnormal hypermetabolic activity within the liver, pancreas, adrenal glands, or spleen. No hypermetabolic lymph nodes in the abdomen or pelvis. Atherosclerotic calcifications of the abdominal aorta and branch vessels. Left renal sinus cysts. Sigmoid diverticulosis, without evidence of diverticulitis. Prostatomegaly. SKELETON: Vague hypermetabolism involving the inferior sternum, max SUV 3.2, previously 8.2. Focal hypermetabolism involving the left sacrum, max SUV 4.0, previously 6.7. EXTREMITIES: No abnormal hypermetabolic activity in the lower extremities.    02/18/2017 -  Chemotherapy   Bortezomib 1.53m/m2 QWk + Dexamethasone 171mQTue/Fri --Cycle #1, 02/18/17     02/18/2017 - 08/26/2017 Chemotherapy   The patient had bortezomib SQ (VELCADE) chemo injection 3 mg, 1.3 mg/m2 = 3 mg, Subcutaneous,  Once, 7 of 9 cycles Administration: 3 mg (02/18/2017), 3 mg (02/26/2017), 3 mg (03/04/2017), 3 mg (03/11/2017), 3 mg (04/08/2017), 3 mg (03/18/2017), 3 mg (03/25/2017), 3 mg (04/01/2017), 3 mg (05/06/2017), 3 mg (05/20/2017), 3 mg (06/03/2017), 3 mg (06/17/2017), 3 mg (07/01/2017), 3 mg (07/15/2017), 3 mg (07/29/2017), 3 mg (08/12/2017), 3 mg (08/26/2017)   for chemotherapy treatment.     09/17/2017 -  Chemotherapy      Patient is on Antibody Plan: MYELOMA DARATUMUMAB/POMALIDOMIDE/DEXAMETHASONE Q28D X 7 CYCLES       CANCER STAGING: Cancer Staging No matching staging information was found for the patient.  Virtual Visit via Video Note  I connected with Dean [name] @TODAY @ at 10:15 AM EDT by a video enabled telemedicine application and verified that I am speaking with the  correct person using two identifiers.  Location: Patient: clinic Provider: home  Others present: ToRenda RollsRN   I discussed the limitations of evaluation and management by telemedicine and the availability of in person appointments. The patient expressed understanding and agreed to proceed.  INTERVAL HISTORY:  [name] a [age].o. male, returns for routine follow-up and consideration for next cycle of chemotherapy. JaDrayas last seen on 06/25/20.  Due for cycle #34 of Darzalex Faspro today.   Overall, he tells me he has been feeling pretty well. He reports 2 falls over the past month; during the first fall he injured his right hip. He reports lessened appetite and loss of taste.    Overall, he feels ready for next cycle of chemo today.   REVIEW OF SYSTEMS:  Review of Systems  Constitutional:  Positive for appetite change.  Musculoskeletal:  Positive for arthralgias (right hip d/t fall).   PAST MEDICAL/SURGICAL HISTORY:  Past Medical History:  Diagnosis Date   BPH (benign prostatic hyperplasia)    Colonic diverticular abscess 01/08/2015   Diverticulitis 1166/04/24 complicated by abscess and required percutaneous drainage   GERD (gastroesophageal reflux disease)    H/O ETOH abuse    History of chemotherapy last done jan 2017   History of radiation therapy 01/05/13-02/10/13   45 gray to  left occipital condyle region   History of radiation therapy 12/01/16-12/10/16   Parasternal nodule, chest- 24 Gy total delivered in 8 fractions, Left sacro-iliac, pelvis- 24 Gy total delivered in 8 fractions    History of radiation therapy 02/19/17-02/22/17   right temporal scalp 30 Gy in 10 fractions   Intra-abdominal abscess ([facility])    Multiple myeloma ([place]) 2015   Neuropathy    Radiation 02/21/14-03/08/14   right posterior chest wall area 30 gray   Radiation 04/19/14-05/02/14   lumbar spine 25 gray   Skull lesion    Left occipital condyle   Wrist fracture, left    x 2   Past  Surgical History:  Procedure Laterality Date   COLONOSCOPY N/A 04/19/2015   Procedure: COLONOSCOPY;  Surgeon: Danie Binder, MD;  Location: [facility];  Service: Endoscopy;  Laterality: N/A;  1:30 PM   CYST EXCISION  1959   tail bone   IR IMAGING GUIDED PORT INSERTION  09/25/2017    SOCIAL HISTORY:  Social History   Socioeconomic History   Marital status: Married    Spouse name: Not on file   Number of children: 3   Years of education: Not on file   Highest education level: Not on file  Occupational History    Employer: RETIRED  Tobacco Use   Smoking status: Never   Smokeless tobacco: Never  Vaping Use   Vaping Use: Never used  Substance and Sexual Activity   Alcohol use: No    Comment: " Not much no more"   Drug use: No   Sexual activity: Not on file  Other Topics Concern   Not on file  Social History Narrative   Not on file   Social Determinants of Health   Financial Resource Strain: Low Risk    Difficulty of Paying Living Expenses: Not very hard  Food Insecurity: No Food Insecurity   Worried About Running Out of Food in the Last Year: Never true   [place] in the Last Year: Never true  Transportation Needs: No Transportation Needs   Lack of Transportation (Medical): No   Lack of Transportation (Non-Medical): No  Physical Activity: Inactive   Days of Exercise per Week: 0 days   Minutes of Exercise per Session: 0 min  Stress: No Stress Concern Present   Feeling of Stress : Not at all  Social Connections: Moderately Isolated   Frequency of Communication with Friends and Family: More than three times a week   Frequency of Social Gatherings with Friends and Family: Once a week   Attends Religious Services: Never   Marine scientist or Organizations: No   Attends Music therapist: Never   Marital Status: Married  Human resources officer Violence: Not At Risk   Fear of Current or Ex-Partner: No   Emotionally Abused: No   Physically Abused:  No   Sexually Abused: No    FAMILY HISTORY:  Family History  Problem Relation Age of Onset   Diabetes Mother    Stroke Mother    Hypertension Father     CURRENT MEDICATIONS:  Current Outpatient Medications  Medication Sig Dispense Refill   acyclovir (ZOVIRAX) 400 MG tablet Take 1 tablet (400 mg total) by mouth 2 (two) times daily. 60 tablet 6   CALCIUM-VITAMIN D PO Take 1 tablet by mouth 2 (two) times daily.      cephALEXin (KEFLEX) 500 MG capsule Take 1 capsule (500 mg total) by mouth 3 (three) times daily.  21 capsule 0   dexamethasone (DECADRON) 4 MG tablet      fluticasone (FLONASE) 50 MCG/ACT nasal spray Place 2 sprays into both nostrils daily. 16 g 6   furosemide (LASIX) 20 MG tablet TAKE 1 TABLET BY MOUTH ONCE A DAY AS NEEDED 30 tablet 3   gabapentin (NEURONTIN) 400 MG capsule Take 1 capsule (400 mg total) by mouth 3 (three) times daily. 90 capsule 4   Homeopathic Products (THERAWORX RELIEF EX) Apply topically as needed.     lidocaine-prilocaine (EMLA) cream Apply small amount over port one (1) hour prior to appointment. 30 g 0   montelukast (SINGULAIR) 10 MG tablet TAKE 1 TABLET BY MOUTH EVERY NIGHT AT BEDTIME 30 tablet 3   omeprazole (PRILOSEC) 20 MG capsule TAKE 1 CAPSULE BY MOUTH ONCE DAILY 30 capsule 5   oxybutynin (DITROPAN-XL) 10 MG 24 hr tablet TAKE 1 TABLET BY MOUTH EVERY NIGHT AT BEDTIME 30 tablet 3   senna-docusate (SENOKOT-S) 8.6-50 MG tablet Take 1 tablet by mouth 2 (two) times daily. 60 tablet 5   silver sulfADIAZINE (SILVADENE) 1 % cream Apply 1 application topically daily. 50 g 0   tamsulosin (FLOMAX) 0.4 MG CAPS capsule TAKE ONE CAPSULE BY MOUTH EVERY NIGHT ATBEDTIME 30 capsule 11   traZODone (DESYREL) 100 MG tablet Take 3 tablets (300 mg total) by mouth at bedtime. TAKE 1 TABLET ( 100MG TOTAL ) BY MOUTH AT BEDTIME. 90 tablet 3   triamcinolone cream (KENALOG) 0.1 % Apply 1 application topically 2 (two) times daily. 30 g 0   zolendronic acid (ZOMETA) 4 MG/5ML  injection Inject 4 mg into the vein every 8 (eight) weeks.     ALPRAZolam (XANAX) 1 MG tablet TAKE 1 TABLET BY MOUTH 3 TIMES A DAY AS NEEDED FOR ANXIETY. (Patient not taking: Reported on 08/20/2020) 60 tablet 5   guaiFENesin (MUCINEX) 600 MG 12 hr tablet Take 1 tablet (600 mg total) by mouth 2 (two) times daily as needed for cough or to loosen phlegm. (Patient not taking: Reported on 08/20/2020) 30 tablet 0   ibuprofen (ADVIL) 600 MG tablet Take 1 tablet (600 mg total) by mouth every 8 (eight) hours as needed. (Patient not taking: Reported on 08/20/2020) 30 tablet 0   Oxycodone HCl 10 MG TABS Take 1 tablet (10 mg total) by mouth every 8 (eight) hours as needed. for pain (Patient not taking: Reported on 08/20/2020) 120 tablet 0   No current facility-administered medications for this visit.    ALLERGIES:  Allergies  Allergen Reactions   Codeine Other (See Comments)    Headache   Morphine And Related Other (See Comments)    Makes him feel weird   Revlimid [Lenalidomide] Other (See Comments)    "Causes me to become weak"    Performance status (ECOG): 1 - Symptomatic but completely ambulatory  Vitals:   08/20/20 0900  BP: 112/60  Pulse: 97  Resp: 18  Temp: (!) 97 F (36.1 C)  SpO2: 97%   Wt Readings from Last 3 Encounters:  08/20/20 186 lb 8.2 oz (84.6 kg)  07/23/20 184 lb 3.2 oz (83.6 kg)  06/25/20 192 lb 3.9 oz (87.2 kg)   LABORATORY DATA:  I have reviewed the labs as listed.  CBC Latest Ref Rng & Units 07/23/2020 06/25/2020 05/28/2020  WBC 4.0 - 10.5 K/uL 5.9 4.9 4.7  Hemoglobin 13.0 - 17.0 g/dL 14.7 13.8 13.4  Hematocrit 39.0 - 52.0 % 42.8 41.8 40.1  Platelets 150 - 400 K/uL 138(L) 122(L) 121(L)  CMP Latest Ref Rng & Units 08/20/2020 07/23/2020 06/25/2020  Glucose 70 - 99 mg/dL 108(H) 144(H) 91  BUN 8 - 23 mg/dL 23 22 25(H)  Creatinine 0.61 - 1.24 mg/dL 0.74 0.69 0.88  Sodium 135 - 145 mmol/L 138 139 138  Potassium 3.5 - 5.1 mmol/L 4.0 3.3(L) 3.8  Chloride 98 - 111 mmol/L 103  103 105  CO2 22 - 32 mmol/L 29 25 27   Calcium 8.9 - 10.3 mg/dL 8.5(L) 8.6(L) 8.3(L)  Total Protein 6.5 - 8.1 g/dL 4.8(L) 5.7(L) 5.4(L)  Total Bilirubin 0.3 - 1.2 mg/dL 0.4 1.1 0.7  Alkaline Phos 38 - 126 U/L 24(L) 21(L) 20(L)  AST 15 - 41 U/L 13(L) 18 14(L)  ALT 0 - 44 U/L 9 11 11     DIAGNOSTIC IMAGING:  I have independently reviewed the scans and discussed with the patient. No results found.   ASSESSMENT:  1.  Recurrent IgG kappa multiple myeloma:  -RVD from 04/07/2014 through 07/28/2014 followed by maintenance Revlimid, which was discontinued. -MRI of the brain on 02/09/2017 showed right temporal lesion, status post radiation, PET/CT on January 2019 showed improvement in multiple myeloma with no new lesions. -Velcade changed from every other week on 05/06/2017 secondary to neuropathy. -MRI of the thoracic and lumbar spine on 09/03/2017 showed new rib lesion close to the spine on the left side, possibly eighth rib.  There was soft tissue mass in that area.  There is chronic L4 compression fracture which is stable. -Status post XRT to the left eighth rib lesion, 30 Gray in 10 fractions from 827 2019-9 12/2017 -DPD regimen started on 09/17/2017, developed erythematous maculopapular rash involving the trunk after taking 3 pills of pomalidomide. -Daratumumab restarted after radiation on 10/28/2017, Pomalyst completely discontinued on 02/12/2018 for recurrent rash. -Subcu daratumumab on 10/25/2018.  He takes dexamethasone 10 mg on days of treatment. -On monthly daratumumab and dexamethasone. -Revlimid 10 mg 3 weeks on 1 week off was supposed to be started on 11/24/2019.   Revlimid discontinued after first cycle in December secondary to rash. - He cannot tolerate Revlimid or Pomalyst due to rash.   PLAN:  1.  IgG kappa myeloma: - Reviewed myeloma labs from 07/23/2020.  M spike is 0.2 g.  Creatinine is 0.69 and calcium is 8.6.  CBC was grossly normal.  Mild thrombocytopenia at 138.  Free light chain ratio  was 6.36. - He reports decreased eating as he lost taste.  However his weight is up by 2 pounds. - We will follow-up on myeloma labs from today.  We will proceed with Darzalex today along with dexamethasone 20 mg. - RTC 4 weeks for follow-up with repeat labs.   2.  Low back pain: - He was taking oxycodone 10 mg 2 to 3 tablets daily. - He reportedly fell 2 weeks ago and has right hip pain.  It is gradually improving.  He will increase oxycodone to 3 times daily.  I have told him to be careful while he is on narcotics due to increased risk of falls.   3.  Peripheral neuropathy: - Continue gabapentin twice daily.   4.  Bone protection: - Continue Zometa every 8 weeks.  Calcium is 8.5.  No jaw pains.   5.  Sleeping difficulty: - Continue trazodone and Xanax at bedtime as needed.   6.  Hypokalemia: - Continue potassium daily.  Potassium today is 4.0.   Orders placed this encounter:  No orders of the defined types were placed in this encounter.  I  provided 30 minutes of non-face-to-face time during this encounter.  Derek Jack, MD [facility] [phone]   I, Thana Ates, am acting as a scribe for Dr. Derek Jack.  I, Derek Jack MD, have reviewed the above documentation for accuracy and completeness, and I agree with the above.

## 2020-08-20 ENCOUNTER — Inpatient Hospital Stay (HOSPITAL_COMMUNITY): Payer: Medicare Other | Attending: Hematology and Oncology

## 2020-08-20 ENCOUNTER — Other Ambulatory Visit: Payer: Self-pay | Admitting: Surgery

## 2020-08-20 ENCOUNTER — Encounter (HOSPITAL_COMMUNITY): Payer: Self-pay

## 2020-08-20 ENCOUNTER — Other Ambulatory Visit: Payer: Self-pay

## 2020-08-20 ENCOUNTER — Other Ambulatory Visit (HOSPITAL_COMMUNITY): Payer: Self-pay | Admitting: *Deleted

## 2020-08-20 ENCOUNTER — Inpatient Hospital Stay (HOSPITAL_COMMUNITY): Payer: Medicare Other

## 2020-08-20 ENCOUNTER — Inpatient Hospital Stay (HOSPITAL_BASED_OUTPATIENT_CLINIC_OR_DEPARTMENT_OTHER): Payer: Medicare Other | Admitting: Hematology

## 2020-08-20 VITALS — BP 112/60 | HR 97 | Temp 97.0°F | Resp 18 | Wt 186.5 lb

## 2020-08-20 DIAGNOSIS — Z5112 Encounter for antineoplastic immunotherapy: Secondary | ICD-10-CM | POA: Insufficient documentation

## 2020-08-20 DIAGNOSIS — Z79899 Other long term (current) drug therapy: Secondary | ICD-10-CM | POA: Diagnosis not present

## 2020-08-20 DIAGNOSIS — C9000 Multiple myeloma not having achieved remission: Secondary | ICD-10-CM

## 2020-08-20 LAB — COMPREHENSIVE METABOLIC PANEL
ALT: 9 U/L (ref 0–44)
AST: 13 U/L — ABNORMAL LOW (ref 15–41)
Albumin: 3.1 g/dL — ABNORMAL LOW (ref 3.5–5.0)
Alkaline Phosphatase: 24 U/L — ABNORMAL LOW (ref 38–126)
Anion gap: 6 (ref 5–15)
BUN: 23 mg/dL (ref 8–23)
CO2: 29 mmol/L (ref 22–32)
Calcium: 8.5 mg/dL — ABNORMAL LOW (ref 8.9–10.3)
Chloride: 103 mmol/L (ref 98–111)
Creatinine, Ser: 0.74 mg/dL (ref 0.61–1.24)
GFR, Estimated: >60 mL/min (ref >60)
Glucose, Bld: 108 mg/dL — ABNORMAL HIGH (ref 70–99)
Potassium: 4 mmol/L (ref 3.5–5.1)
Sodium: 138 mmol/L (ref 135–145)
Total Bilirubin: 0.4 mg/dL (ref 0.3–1.2)
Total Protein: 4.8 g/dL — ABNORMAL LOW (ref 6.5–8.1)

## 2020-08-20 LAB — CBC WITH DIFFERENTIAL/PLATELET
Abs Immature Granulocytes: 0.01 10*3/uL (ref 0.00–0.07)
Basophils Absolute: 0 10*3/uL (ref 0.0–0.1)
Basophils Relative: 0 %
Eosinophils Absolute: 0.1 10*3/uL (ref 0.0–0.5)
Eosinophils Relative: 1 %
HCT: 37.8 % — ABNORMAL LOW (ref 39.0–52.0)
Hemoglobin: 12.4 g/dL — ABNORMAL LOW (ref 13.0–17.0)
Immature Granulocytes: 0 %
Lymphocytes Relative: 20 %
Lymphs Abs: 1 10*3/uL (ref 0.7–4.0)
MCH: 31.9 pg (ref 26.0–34.0)
MCHC: 32.8 g/dL (ref 30.0–36.0)
MCV: 97.2 fL (ref 80.0–100.0)
Monocytes Absolute: 0.5 10*3/uL (ref 0.1–1.0)
Monocytes Relative: 11 %
Neutro Abs: 3.3 10*3/uL (ref 1.7–7.7)
Neutrophils Relative %: 68 %
Platelets: 127 10*3/uL — ABNORMAL LOW (ref 150–400)
RBC: 3.89 MIL/uL — ABNORMAL LOW (ref 4.22–5.81)
RDW: 13.8 % (ref 11.5–15.5)
WBC: 4.9 10*3/uL (ref 4.0–10.5)
nRBC: 0 % (ref 0.0–0.2)

## 2020-08-20 LAB — MAGNESIUM: Magnesium: 2 mg/dL (ref 1.7–2.4)

## 2020-08-20 MED ORDER — HEPARIN SOD (PORK) LOCK FLUSH 100 UNIT/ML IV SOLN
500.0000 [IU] | Freq: Once | INTRAVENOUS | Status: AC
  Administered 2020-08-20: 500 [IU] via INTRAVENOUS

## 2020-08-20 MED ORDER — ACETAMINOPHEN 325 MG PO TABS
ORAL_TABLET | ORAL | Status: AC
  Filled 2020-08-20: qty 2

## 2020-08-20 MED ORDER — DIPHENHYDRAMINE HCL 25 MG PO CAPS
ORAL_CAPSULE | ORAL | Status: AC
  Filled 2020-08-20: qty 2

## 2020-08-20 MED ORDER — DEXAMETHASONE 4 MG PO TABS
20.0000 mg | ORAL_TABLET | Freq: Once | ORAL | Status: AC
  Administered 2020-08-20: 20 mg via ORAL

## 2020-08-20 MED ORDER — OXYCODONE HCL 5 MG PO TABS
10.0000 mg | ORAL_TABLET | Freq: Once | ORAL | Status: AC
  Administered 2020-08-20: 10 mg via ORAL

## 2020-08-20 MED ORDER — DIPHENHYDRAMINE HCL 25 MG PO CAPS
50.0000 mg | ORAL_CAPSULE | Freq: Once | ORAL | Status: AC
  Administered 2020-08-20: 50 mg via ORAL

## 2020-08-20 MED ORDER — OXYCODONE HCL 10 MG PO TABS: 10.0000 mg | ORAL_TABLET | Freq: Three times a day (TID) | ORAL | 0 refills | Status: DC

## 2020-08-20 MED ORDER — PROCHLORPERAZINE MALEATE 10 MG PO TABS
ORAL_TABLET | ORAL | Status: AC
  Filled 2020-08-20: qty 1

## 2020-08-20 MED ORDER — DARATUMUMAB-HYALURONIDASE-FIHJ 1800-30000 MG-UT/15ML ~~LOC~~ SOLN
1800.0000 mg | Freq: Once | SUBCUTANEOUS | Status: AC
  Administered 2020-08-20: 1800 mg via SUBCUTANEOUS
  Filled 2020-08-20: qty 15

## 2020-08-20 MED ORDER — PROCHLORPERAZINE MALEATE 10 MG PO TABS
10.0000 mg | ORAL_TABLET | Freq: Once | ORAL | Status: AC
  Administered 2020-08-20: 10 mg via ORAL

## 2020-08-20 MED ORDER — DEXAMETHASONE 4 MG PO TABS
ORAL_TABLET | ORAL | Status: AC
  Filled 2020-08-20: qty 5

## 2020-08-20 MED ORDER — OXYCODONE HCL 5 MG PO TABS
ORAL_TABLET | ORAL | Status: AC
  Filled 2020-08-20: qty 2

## 2020-08-20 MED ORDER — ACETAMINOPHEN 325 MG PO TABS
650.0000 mg | ORAL_TABLET | Freq: Once | ORAL | Status: AC
  Administered 2020-08-20: 650 mg via ORAL

## 2020-08-20 NOTE — Progress Notes (Signed)
Patient is here today for Darzalex [place] per MD orders.  He states that he has not been doing well.  He has fallen twice in the last few months.  He fell pretty hard on his right hip and has pain in that hip.  He reports that he had to crawl into the living room and then have EMS come help him up out of the floor.  He reports that his wounds from the fall have healed but the pain in that hip is still severe 9/10.  He will discuss with Dr. Delton Coombes today on what to do with his pain regimen, because what he is taking currently isn't working.  He has been having some constipation and was encouraged to use colace as needed.  He said anytime he is taking a lot of pain medications, it makes him constipated.  I gave him the information sheet on constipation and told him to call us should he go more than 2-3 days w/o bowel movement.  He verbalizes understanding.   Patient's labs not back prior to treatment, Dr. Delton Coombes okay to proceed without results.    Patient remained stable during treatment.   Received orders from Dr. Delton Coombes to give patient oxycodone 10mg  once here today. He also called this in to his pharmacy to take every 8 hours for pain.  Patient is aware.   Patient discharged in stable condition via wheelchair with his wife.  He left before PRN med reassessment complete.  He knew that it would take a while for pain meds to work and was advised to stay on top of his pain and not let it get out of control.  He verbalizes understanding.  He will follow up as scheduled.

## 2020-08-20 NOTE — Progress Notes (Signed)
Patient has been assessed, vital signs and labs have been reviewed by Dr. Katragadda. ANC, Creatinine, LFTs, and Platelets are within treatment parameters per Dr. Katragadda. The patient is good to proceed with treatment at this time. Primary RN and pharmacy aware.  

## 2020-08-20 NOTE — Progress Notes (Signed)
Ok to proceed without CBC reported out.  Lab is down with issues.  T.O. Dr Jim Desanctis, RN/Norlene Lanes Ronnald Ramp, PharmD

## 2020-08-20 NOTE — Patient Instructions (Addendum)
[facility] at [facility] Discharge Instructions  You were seen today by Dr. Katragadda. He went over your recent results, and you received your treatment. Dr. Katragadda will see you back in 1 month for labs and follow up.   Thank you for choosing [facility] at [facility] to provide your oncology and hematology care.  To afford each patient quality time with our provider, please arrive at least 15 minutes before your scheduled appointment time.   If you have a lab appointment with the Cancer Center please come in thru the Main Entrance and check in at the main information desk  You need to re-schedule your appointment should you arrive 10 or more minutes late.  We strive to give you quality time with our providers, and arriving late affects you and other patients whose appointments are after yours.  Also, if you no show three or more times for appointments you may be dismissed from the clinic at the providers discretion.     Again, thank you for choosing [facility].  Our hope is that these requests will decrease the amount of time that you wait before being seen by our physicians.       _____________________________________________________________  Should you have questions after your visit to [facility], please contact our office at [phone] between the hours of 8:00 a.m. and 4:30 p.m.  Voicemails left after 4:00 p.m. will not be returned until the following business day.  For prescription refill requests, have your pharmacy contact our office and allow 72 hours.    Cancer Center Support Programs:   > Cancer Support Group  2nd Tuesday of the month 1pm-2pm, Journey Room   

## 2020-08-20 NOTE — Patient Instructions (Signed)
[facility]  Discharge Instructions: Thank you for choosing [facility] to provide your oncology and hematology care.  If you have a lab appointment with the [facility], please come in thru the Main Entrance and check in at the main information desk.  Wear comfortable clothing and clothing appropriate for easy access to any Portacath or PICC line.   We strive to give you quality time with your provider. You may need to reschedule your appointment if you arrive late (15 or more minutes).  Arriving late affects you and other patients whose appointments are after yours.  Also, if you miss three or more appointments without notifying the office, you may be dismissed from the clinic at the provider's discretion.      For prescription refill requests, have your pharmacy contact our office and allow 72 hours for refills to be completed.    Today you received the following chemotherapy and/or immunotherapy agents Darzalex subcutaneous injection.        To help prevent nausea and vomiting after your treatment, we encourage you to take your nausea medication as directed.  BELOW ARE SYMPTOMS THAT SHOULD BE REPORTED IMMEDIATELY: *FEVER GREATER THAN 100.4 F (38 C) OR HIGHER *CHILLS OR SWEATING *NAUSEA AND VOMITING THAT IS NOT CONTROLLED WITH YOUR NAUSEA MEDICATION *UNUSUAL SHORTNESS OF BREATH *UNUSUAL BRUISING OR BLEEDING *URINARY PROBLEMS (pain or burning when urinating, or frequent urination) *BOWEL PROBLEMS (unusual diarrhea, constipation, pain near the anus) TENDERNESS IN MOUTH AND THROAT WITH OR WITHOUT PRESENCE OF ULCERS (sore throat, sores in mouth, or a toothache) UNUSUAL RASH, SWELLING OR PAIN  UNUSUAL VAGINAL DISCHARGE OR ITCHING   Items with * indicate a potential emergency and should be followed up as soon as possible or go to the Emergency Department if any problems should occur.  Please show the CHEMOTHERAPY ALERT CARD or IMMUNOTHERAPY ALERT CARD at  check-in to the Emergency Department and triage nurse.  Should you have questions after your visit or need to cancel or reschedule your appointment, please contact [facility] [phone]  and follow the prompts.  Office hours are 8:00 a.m. to 4:30 p.m. Monday - Friday. Please note that voicemails left after 4:00 p.m. may not be returned until the following business day.  We are closed weekends and major holidays. You have access to a nurse at all times for urgent questions. Please call the main number to the clinic [phone] and follow the prompts.  For any non-urgent questions, you may also contact your provider using MyChart. We now offer e-Visits for anyone 83 and older to request care online for non-urgent symptoms. For details visit [email or web address].   Also download the MyChart app! Go to the app store, search "MyChart", open the app, select [facility], and log in with your MyChart username and password.  Due to Covid, a mask is required upon entering the hospital/clinic. If you do not have a mask, one will be given to you upon arrival. For doctor visits, patients may have 1 support person aged 83 or older with them. For treatment visits, patients cannot have anyone with them due to current Covid guidelines and our immunocompromised population.

## 2020-08-25 ENCOUNTER — Other Ambulatory Visit (HOSPITAL_COMMUNITY): Payer: Self-pay | Admitting: Hematology

## 2020-08-25 DIAGNOSIS — G4700 Insomnia, unspecified: Secondary | ICD-10-CM

## 2020-08-27 ENCOUNTER — Encounter (HOSPITAL_COMMUNITY): Payer: Self-pay | Admitting: Hematology

## 2020-08-30 ENCOUNTER — Encounter (HOSPITAL_COMMUNITY): Payer: Self-pay

## 2020-08-30 ENCOUNTER — Telehealth: Payer: Self-pay | Admitting: Radiology

## 2020-08-30 NOTE — Telephone Encounter (Signed)
Patient states he has noticed a painful knot on his left side toward his back. There is also a knot under his chin on the sternum that is growing larger. He was last seen by Dr Sondra Come in 2021 for multiple myeloma. Treatment ended on 10/14/2017.

## 2020-08-30 NOTE — Telephone Encounter (Signed)
Dr Delton Coombes, This patient called to report new knots on his left side and sternum. Dr Sondra Come is aware and asks for you to follow up on this. Patient is aware.

## 2020-08-30 NOTE — Progress Notes (Signed)
Patient called stating that he felt knot on his left side about midway of his back near kidney area. He states that it is painful and that he spoke with Dr. Clabe Seal office and they told him to contact Dr. Delton Coombes.   Spoke with Dr. Delton Coombes and he wants patient to keep follow up as planned for 09/12/20. If pain is worse or unbearable to go to the urgent care or ED in the meantime.   Patient is aware and agreeable with plan.

## 2020-08-31 ENCOUNTER — Other Ambulatory Visit: Payer: Self-pay | Admitting: Family Medicine

## 2020-09-12 ENCOUNTER — Ambulatory Visit (HOSPITAL_COMMUNITY): Payer: Medicare Other | Admitting: Hematology

## 2020-09-12 ENCOUNTER — Ambulatory Visit (HOSPITAL_COMMUNITY): Payer: Medicare Other

## 2020-09-12 ENCOUNTER — Other Ambulatory Visit (HOSPITAL_COMMUNITY): Payer: Medicare Other

## 2020-09-16 NOTE — Progress Notes (Signed)
[place], [place] [address]   CLINIC:  Medical Oncology/Hematology  PCP:  [name], MD [address] [place] [address] [phone]   REASON FOR VISIT:  Follow-up for IgG kappa multiple myeloma  PRIOR THERAPY: Velcade x 6 cycles from 04/07/2014 to 07/28/2014; x 7 cycles from 02/18/2017 to 08/26/2017  NGS Results: not done  CURRENT THERAPY: Darzalex Faspro every 4 weeks  BRIEF ONCOLOGIC HISTORY:  Oncology History  Multiple myeloma without remission ([place])  11/16/2012 Initial Diagnosis   L occipital condyle destructive lesion, not biopsied secondary to location. XRT   12/01/2012 Imaging   L3 superior endplate compression FX, no lytic lesions to suggest myeloma   02/06/2014 Imaging   soft tissue mass along the right posterior fifth rib with some rib destruction   02/16/2014 Miscellaneous   Normal CBC, Normal CMP, kappa,lamda ratio of 2.62 (abnormal), UIEP with slightly restricted band, IgG kappa, SPEP/IEP with monoclonal protein at 0.79 g/dl, normal igG, suppressed IGM   02/20/2014 Bone Marrow Biopsy   33% kappa restricted plasma cells Normal FISH, normal cytogenetics   02/21/2014 - 03/08/2014 Radiation Therapy   30Gy to R rib lesion, lesion not biopsied   03/16/2014 PET scan   Scattered hypermetabolic osseous lesions, including the calvarium, ribs, left scapula, sacrum, and left femoral shaft. An expansile left vertebral body lesion at L3 extends into the epidural space of the left lateral recess,    03/17/2014 - 04/07/2014 Chemotherapy   Velcade and Dexamethasone due to initial denial of Revlimid by insurance company.  Zometa monthly.   03/22/2014 Imaging   MR_ L-Spine- Enhancing lesions compatible with multiple myeloma at L2, L3, and S1-2.   04/07/2014 - 07/28/2014 Chemotherapy   RVD with Revlimid at 25 mg days 1-14.  Revlimid dose reduced to 25 mg every other day x 14 days with 7 day respite beginning on day 1 of  cycle 2.   04/17/2014 Adverse Reaction   Revlimid-induced rash.  Treated with steroids.   07/28/2014 - 08/04/2014 Chemotherapy   Revlimid daily   08/04/2014 Adverse Reaction   Velcade-induced peripheral neuropathy   08/04/2014 Treatment Plan Change   D/C Velcade   08/11/2014 PET scan   Response to therapy. Improvement and resolution of foci of osseous hypermetabolism.   08/22/2014 - 08/28/2014 Chemotherapy   Revlimid 25 mg days 1-21 every 28 days   08/28/2014 Adverse Reaction   Revlimid-induced rash. Medication held.  Medrol dose Pak prescribed.   09/04/2014 - 01/08/2015 Chemotherapy   Revlimid 10 mg every other day, without dexamethasone   01/08/2015 - 01/15/2015 Hospital Admission   Colonic diverticular abscess with IR drain placement   03/02/2015 PET scan   Only bony uptake is low activity at site of deformity/callus at R second rib FX, high activity in sigmoid colon, advanced sigmoid divertidulosis. Abscess not resolved?   06/04/2015 Imaging   CT nonobstructive L renal calculus, diverticulosis of descending and sigmoid colon without inflammation, moderate prostatic enlargement   07/12/2015 Surgery   Diverticulitis s/p robotic sigmoid colectomy with Dr. Johney Maine   08/23/2015 PET scan   Primarily similar hypermetabolic osseous foci within the R sided ribs. new L third rib focus of hypermetab and sclerosis is favored to be related to healing FX. no soft tissue myeloma id'd. Presumed postop hypermetab and edema about the R pelvic wall    03/06/2016 PET scan   1. Reduced activity in the prior lesion such as the right second and  fifth rib lesions, activity nearly completely resolved and significantly less than added mediastinal blood pool activity. No new lesions are identified. 2. Other imaging findings of potential clinical significance: Coronary, aortic arch, and branch vessel atherosclerotic vascular disease. Aortoiliac atherosclerotic vascular disease. Enlarged prostate gland. Colonic  diverticula.   02/17/2017 PET scan   HEAD/NECK: No hypermetabolic activity in the scalp. No hypermetabolic cervical lymph nodes. CHEST: No hypermetabolic mediastinal or hilar nodes. Right upper lobe scarring/atelectasis. No suspicious pulmonary nodules on the CT scan. ABDOMEN/PELVIS: No abnormal hypermetabolic activity within the liver, pancreas, adrenal glands, or spleen. No hypermetabolic lymph nodes in the abdomen or pelvis. Atherosclerotic calcifications of the abdominal aorta and branch vessels. Left renal sinus cysts. Sigmoid diverticulosis, without evidence of diverticulitis. Prostatomegaly. SKELETON: Vague hypermetabolism involving the inferior sternum, max SUV 3.2, previously 8.2. Focal hypermetabolism involving the left sacrum, max SUV 4.0, previously 6.7. EXTREMITIES: No abnormal hypermetabolic activity in the lower extremities.   02/18/2017 -  Chemotherapy   Bortezomib 1.58m/m2 QWk + Dexamethasone 138mQTue/Fri --Cycle #1, 02/18/17    02/18/2017 - 08/26/2017 Chemotherapy   The patient had bortezomib SQ (VELCADE) chemo injection 3 mg, 1.3 mg/m2 = 3 mg, Subcutaneous,  Once, 7 of 9 cycles Administration: 3 mg (02/18/2017), 3 mg (02/26/2017), 3 mg (03/04/2017), 3 mg (03/11/2017), 3 mg (04/08/2017), 3 mg (03/18/2017), 3 mg (03/25/2017), 3 mg (04/01/2017), 3 mg (05/06/2017), 3 mg (05/20/2017), 3 mg (06/03/2017), 3 mg (06/17/2017), 3 mg (07/01/2017), 3 mg (07/15/2017), 3 mg (07/29/2017), 3 mg (08/12/2017), 3 mg (08/26/2017)   for chemotherapy treatment.     09/17/2017 -  Chemotherapy      Patient is on Antibody Plan: MYELOMA DARATUMUMAB/POMALIDOMIDE/DEXAMETHASONE Q28D X 7 CYCLES       CANCER STAGING: Cancer Staging No matching staging information was found for the patient.  INTERVAL HISTORY:  Mr. JaJianni Batten a [age].o. male, returns for routine follow-up and consideration for next cycle of chemotherapy. JaVerniceas last seen on 06/25/20.  Due for cycle #35 of Darzalex Faspro today.   Overall,  he tells me he has been feeling pretty well. He reports pain and a knot in his left lower back. He uses a cane while walking, and he reports poor appetite. His sleep is poor and he wakes up throughout the night; he is taking 3 trazodone and 1 xanax before bedtime which has not helped.   Overall, he feels ready for next cycle of chemo today.   REVIEW OF SYSTEMS:  Review of Systems  Constitutional:  Positive for appetite change (0%) and fatigue (depleted).  Gastrointestinal:  Positive for constipation.  Genitourinary:  Positive for dysuria.   Musculoskeletal:  Positive for arthralgias (hips 9/10) and myalgias (leg 7/10).  Neurological:  Positive for dizziness, headaches and numbness (feet).  Psychiatric/Behavioral:  Positive for sleep disturbance.   All other systems reviewed and are negative.  PAST MEDICAL/SURGICAL HISTORY:  Past Medical History:  Diagnosis Date   BPH (benign prostatic hyperplasia)    Colonic diverticular abscess 01/08/2015   Diverticulitis 1140/08/67 complicated by abscess and required percutaneous drainage   GERD (gastroesophageal reflux disease)    H/O ETOH abuse    History of chemotherapy last done jan 2017   History of radiation therapy 01/05/13-02/10/13   45 gray to left occipital condyle region   History of radiation therapy 12/01/16-12/10/16   Parasternal nodule, chest- 24 Gy total delivered in 8 fractions, Left sacro-iliac, pelvis- 24 Gy total delivered in 8 fractions    History of  radiation therapy 02/19/17-02/22/17   right temporal scalp 30 Gy in 10 fractions   Intra-abdominal abscess ([facility])    Multiple myeloma ([facility]) 2015   Neuropathy    Radiation 02/21/14-03/08/14   right posterior chest wall area 30 gray   Radiation 04/19/14-05/02/14   lumbar spine 25 gray   Skull lesion    Left occipital condyle   Wrist fracture, left    x 2   Past Surgical History:  Procedure Laterality Date   COLONOSCOPY N/A 04/19/2015   Procedure: COLONOSCOPY;  Surgeon: Danie Binder, MD;  Location: [facility];  Service: Endoscopy;  Laterality: N/A;  1:30 PM   CYST EXCISION  1959   tail bone   IR IMAGING GUIDED PORT INSERTION  09/25/2017    SOCIAL HISTORY:  Social History   Socioeconomic History   Marital status: Married    Spouse name: Not on file   Number of children: 3   Years of education: Not on file   Highest education level: Not on file  Occupational History    Employer: RETIRED  Tobacco Use   Smoking status: Never   Smokeless tobacco: Never  Vaping Use   Vaping Use: Never used  Substance and Sexual Activity   Alcohol use: No    Comment: " Not much no more"   Drug use: No   Sexual activity: Not on file  Other Topics Concern   Not on file  Social History Narrative   Not on file   Social Determinants of Health   Financial Resource Strain: Low Risk    Difficulty of Paying Living Expenses: Not very hard  Food Insecurity: No Food Insecurity   Worried About Running Out of Food in the Last Year: Never true   [place] in the Last Year: Never true  Transportation Needs: No Transportation Needs   Lack of Transportation (Medical): No   Lack of Transportation (Non-Medical): No  Physical Activity: Inactive   Days of Exercise per Week: 0 days   Minutes of Exercise per Session: 0 min  Stress: No Stress Concern Present   Feeling of Stress : Not at all  Social Connections: Moderately Isolated   Frequency of Communication with Friends and Family: More than three times a week   Frequency of Social Gatherings with Friends and Family: Once a week   Attends Religious Services: Never   Marine scientist or Organizations: No   Attends Music therapist: Never   Marital Status: Married  Human resources officer Violence: Not At Risk   Fear of Current or Ex-Partner: No   Emotionally Abused: No   Physically Abused: No   Sexually Abused: No    FAMILY HISTORY:  Family History  Problem Relation Age of Onset   Diabetes Mother     Stroke Mother    Hypertension Father     CURRENT MEDICATIONS:  Current Outpatient Medications  Medication Sig Dispense Refill   acyclovir (ZOVIRAX) 400 MG tablet Take 1 tablet (400 mg total) by mouth 2 (two) times daily. 60 tablet 6   ALPRAZolam (XANAX) 1 MG tablet TAKE 1 TABLET BY MOUTH 3 TIMES A DAY AS NEEDED FOR ANXIETY. 60 tablet 5   CALCIUM-VITAMIN D PO Take 1 tablet by mouth 2 (two) times daily.      cephALEXin (KEFLEX) 500 MG capsule Take 1 capsule (500 mg total) by mouth 3 (three) times daily. 21 capsule 0   dexamethasone (DECADRON) 4 MG tablet  fluticasone (FLONASE) 50 MCG/ACT nasal spray Place 2 sprays into both nostrils daily. 16 g 6   furosemide (LASIX) 20 MG tablet TAKE 1 TABLET BY MOUTH ONCE A DAY AS NEEDED 30 tablet 3   gabapentin (NEURONTIN) 400 MG capsule Take 1 capsule (400 mg total) by mouth 3 (three) times daily. 90 capsule 4   guaiFENesin (MUCINEX) 600 MG 12 hr tablet Take 1 tablet (600 mg total) by mouth 2 (two) times daily as needed for cough or to loosen phlegm. 30 tablet 0   Homeopathic Products (THERAWORX RELIEF EX) Apply topically as needed.     ibuprofen (ADVIL) 600 MG tablet Take 1 tablet (600 mg total) by mouth every 8 (eight) hours as needed. 30 tablet 0   lidocaine-prilocaine (EMLA) cream Apply small amount over port one (1) hour prior to appointment. 30 g 0   montelukast (SINGULAIR) 10 MG tablet TAKE 1 TABLET BY MOUTH EVERY NIGHT AT BEDTIME 30 tablet 3   omeprazole (PRILOSEC) 20 MG capsule TAKE 1 CAPSULE BY MOUTH ONCE DAILY 30 capsule 5   oxybutynin (DITROPAN-XL) 10 MG 24 hr tablet TAKE 1 TABLET BY MOUTH EVERY NIGHT AT BEDTIME 30 tablet 3   Oxycodone HCl 10 MG TABS Take 1 tablet (10 mg total) by mouth in the morning, at noon, and at bedtime. for pain 120 tablet 0   senna-docusate (SENOKOT-S) 8.6-50 MG tablet Take 1 tablet by mouth 2 (two) times daily. 60 tablet 5   silver sulfADIAZINE (SILVADENE) 1 % cream Apply 1 application topically daily. 50 g 0    tamsulosin (FLOMAX) 0.4 MG CAPS capsule TAKE ONE CAPSULE BY MOUTH EVERY NIGHT ATBEDTIME 30 capsule 11   traZODone (DESYREL) 100 MG tablet TAKE 3 TABLETS BY MOUTH AT BEDTIME 90 tablet 3   triamcinolone cream (KENALOG) 0.1 % APPLY 1 APPLICATION TOPICALLY TWICE A DAY 30 g 0   zolendronic acid (ZOMETA) 4 MG/5ML injection Inject 4 mg into the vein every 8 (eight) weeks.     No current facility-administered medications for this visit.    ALLERGIES:  Allergies  Allergen Reactions   Codeine Other (See Comments)    Headache   Morphine And Related Other (See Comments)    Makes him feel weird   Revlimid [Lenalidomide] Other (See Comments)    "Causes me to become weak"    PHYSICAL EXAM:  Performance status (ECOG): 1 - Symptomatic but completely ambulatory  There were no vitals filed for this visit. Wt Readings from Last 3 Encounters:  08/20/20 186 lb 8.2 oz (84.6 kg)  07/23/20 184 lb 3.2 oz (83.6 kg)  06/25/20 192 lb 3.9 oz (87.2 kg)   Physical Exam Vitals reviewed.  Constitutional:      Appearance: Normal appearance.  Cardiovascular:     Rate and Rhythm: Normal rate and regular rhythm.     Pulses: Normal pulses.     Heart sounds: Normal heart sounds.  Pulmonary:     Effort: Pulmonary effort is normal.     Breath sounds: Normal breath sounds.  Neurological:     General: No focal deficit present.     Mental Status: He is alert and oriented to person, place, and time.  Psychiatric:        Mood and Affect: Mood normal.        Behavior: Behavior normal.    LABORATORY DATA:  I have reviewed the labs as listed.  CBC Latest Ref Rng & Units 08/20/2020 07/23/2020 06/25/2020  WBC 4.0 - 10.5 K/uL 4.9 5.9  4.9  Hemoglobin 13.0 - 17.0 g/dL 12.4(L) 14.7 13.8  Hematocrit 39.0 - 52.0 % 37.8(L) 42.8 41.8  Platelets 150 - 400 K/uL 127(L) 138(L) 122(L)   CMP Latest Ref Rng & Units 08/20/2020 07/23/2020 06/25/2020  Glucose 70 - 99 mg/dL 108(H) 144(H) 91  BUN 8 - 23 mg/dL 23 22 25(H)  Creatinine  0.61 - 1.24 mg/dL 0.74 0.69 0.88  Sodium 135 - 145 mmol/L 138 139 138  Potassium 3.5 - 5.1 mmol/L 4.0 3.3(L) 3.8  Chloride 98 - 111 mmol/L 103 103 105  CO2 22 - 32 mmol/L _0 Calcium 8.9 - 10.3 mg/dL 8.5(L) 8.6(L) 8.3(L)  Total Protein 6.5 - 8.1 g/dL 4.8(L) 5.7(L) 5.4(L)  Total Bilirubin 0.3 - 1.2 mg/dL 0.4 1.1 0.7  Alkaline Phos 38 - 126 U/L 24(L) 21(L) 20(L)  AST 15 - 41 U/L 13(L) 18 14(L)  ALT 0 - 44 U/L _1 DIAGNOSTIC IMAGING:  I have independently reviewed the scans and discussed with the patient. No results found.   ASSESSMENT:  1.  Recurrent IgG kappa multiple myeloma:  -RVD from 04/07/2014 through 07/28/2014 followed by maintenance Revlimid, which was discontinued. -MRI of the brain on 02/09/2017 showed right temporal lesion, status post radiation, PET/CT on January 2019 showed improvement in multiple myeloma with no new lesions. -Velcade changed from every other week on 05/06/2017 secondary to neuropathy. -MRI of the thoracic and lumbar spine on 09/03/2017 showed new rib lesion close to the spine on the left side, possibly eighth rib.  There was soft tissue mass in that area.  There is chronic L4 compression fracture which is stable. -Status post XRT to the left eighth rib lesion, 30 Gray in 10 fractions from 827 2019-9 12/2017 -DPD regimen started on 09/17/2017, developed erythematous maculopapular rash involving the trunk after taking 3 pills of pomalidomide. -Daratumumab restarted after radiation on 10/28/2017, Pomalyst completely discontinued on 02/12/2018 for recurrent rash. -Subcu daratumumab on 10/25/2018.  He takes dexamethasone 10 mg on days of treatment. -On monthly daratumumab and dexamethasone. -Revlimid 10 mg 3 weeks on 1 week off was supposed to be started on 11/24/2019.   Revlimid discontinued after first cycle in December secondary to rash. - He cannot tolerate Revlimid or Pomalyst due to rash.   PLAN:  1.  IgG kappa myeloma: - Reviewed myeloma panel from  07/23/2020 which showed M spike 0.2 g and stable.  Free light chain ratio is 6.36 with kappa light chains 24.8. - We have sent myeloma panel from today which is pending. - He does not report any falls from last visit.  He reports mass felt at the left back few days ago.  I have not felt any mass today. - He reported burning on urination.  We have checked a UA which was negative for infection. - I have reviewed his labs today which showed normal LFTs and creatinine.  CBC shows mild leukopenia and thrombocytopenia. - He will proceed with Darzalex today.  He will also receive 20 mg of dexamethasone. -RTC 4 weeks with labs and treatment.   2.  Low back pain: - Continue oxycodone 10 mg 3 tablets/day.   3.  Peripheral neuropathy: - Continue gabapentin twice daily.   4.  Bone protection: - Calcium today is 8.2 with albumin 3.3. - He will continue Zometa today and every 8 weeks.  Continue calcium supplements.   5.  Sleeping difficulty: - Continues trazodone 300 mg at bedtime and Xanax 1 mg at bedtime. -  He still reports occasional problems with sleeping.  He was told to start melatonin 5 mg around 5 PM.   6.  Hypokalemia: - Continue potassium supplements daily.  Potassium today is 3.8.   Orders placed this encounter:  No orders of the defined types were placed in this encounter.    Derek Jack, MD [place] [phone]   I, Thana Ates, am acting as a scribe for Dr. Derek Jack.  I, Derek Jack MD, have reviewed the above documentation for accuracy and completeness, and I agree with the above.

## 2020-09-17 ENCOUNTER — Inpatient Hospital Stay (HOSPITAL_BASED_OUTPATIENT_CLINIC_OR_DEPARTMENT_OTHER): Payer: Medicare Other | Admitting: Hematology

## 2020-09-17 ENCOUNTER — Inpatient Hospital Stay (HOSPITAL_COMMUNITY): Payer: Medicare Other | Attending: Hematology and Oncology

## 2020-09-17 ENCOUNTER — Inpatient Hospital Stay (HOSPITAL_COMMUNITY): Payer: Medicare Other

## 2020-09-17 ENCOUNTER — Other Ambulatory Visit: Payer: Self-pay

## 2020-09-17 VITALS — BP 116/54 | HR 66 | Temp 96.4°F | Resp 18 | Wt 184.0 lb

## 2020-09-17 DIAGNOSIS — R309 Painful micturition, unspecified: Secondary | ICD-10-CM

## 2020-09-17 DIAGNOSIS — Z79899 Other long term (current) drug therapy: Secondary | ICD-10-CM | POA: Diagnosis not present

## 2020-09-17 DIAGNOSIS — D61818 Other pancytopenia: Secondary | ICD-10-CM | POA: Insufficient documentation

## 2020-09-17 DIAGNOSIS — Z923 Personal history of irradiation: Secondary | ICD-10-CM | POA: Insufficient documentation

## 2020-09-17 DIAGNOSIS — Z9181 History of falling: Secondary | ICD-10-CM | POA: Insufficient documentation

## 2020-09-17 DIAGNOSIS — M549 Dorsalgia, unspecified: Secondary | ICD-10-CM | POA: Diagnosis not present

## 2020-09-17 DIAGNOSIS — C9000 Multiple myeloma not having achieved remission: Secondary | ICD-10-CM | POA: Diagnosis not present

## 2020-09-17 DIAGNOSIS — G629 Polyneuropathy, unspecified: Secondary | ICD-10-CM | POA: Insufficient documentation

## 2020-09-17 DIAGNOSIS — Z5112 Encounter for antineoplastic immunotherapy: Secondary | ICD-10-CM | POA: Insufficient documentation

## 2020-09-17 DIAGNOSIS — G4700 Insomnia, unspecified: Secondary | ICD-10-CM | POA: Insufficient documentation

## 2020-09-17 DIAGNOSIS — F419 Anxiety disorder, unspecified: Secondary | ICD-10-CM | POA: Insufficient documentation

## 2020-09-17 LAB — URINALYSIS, ROUTINE W REFLEX MICROSCOPIC
Bilirubin Urine: NEGATIVE
Glucose, UA: NEGATIVE mg/dL
Hgb urine dipstick: NEGATIVE
Ketones, ur: NEGATIVE mg/dL
Leukocytes,Ua: NEGATIVE
Nitrite: NEGATIVE
Protein, ur: NEGATIVE mg/dL
Specific Gravity, Urine: 1.017 (ref 1.005–1.030)
pH: 5 (ref 5.0–8.0)

## 2020-09-17 LAB — COMPREHENSIVE METABOLIC PANEL
ALT: 11 U/L (ref 0–44)
AST: 13 U/L — ABNORMAL LOW (ref 15–41)
Albumin: 3.3 g/dL — ABNORMAL LOW (ref 3.5–5.0)
Alkaline Phosphatase: 18 U/L — ABNORMAL LOW (ref 38–126)
Anion gap: 5 (ref 5–15)
BUN: 20 mg/dL (ref 8–23)
CO2: 28 mmol/L (ref 22–32)
Calcium: 8.2 mg/dL — ABNORMAL LOW (ref 8.9–10.3)
Chloride: 106 mmol/L (ref 98–111)
Creatinine, Ser: 0.7 mg/dL (ref 0.61–1.24)
GFR, Estimated: >60 mL/min (ref >60)
Glucose, Bld: 93 mg/dL (ref 70–99)
Potassium: 3.8 mmol/L (ref 3.5–5.1)
Sodium: 139 mmol/L (ref 135–145)
Total Bilirubin: 0.8 mg/dL (ref 0.3–1.2)
Total Protein: 5.3 g/dL — ABNORMAL LOW (ref 6.5–8.1)

## 2020-09-17 LAB — CBC WITH DIFFERENTIAL/PLATELET
Abs Immature Granulocytes: 0.01 10*3/uL (ref 0.00–0.07)
Basophils Absolute: 0 10*3/uL (ref 0.0–0.1)
Basophils Relative: 0 %
Eosinophils Absolute: 0 10*3/uL (ref 0.0–0.5)
Eosinophils Relative: 1 %
HCT: 39 % (ref 39.0–52.0)
Hemoglobin: 12.7 g/dL — ABNORMAL LOW (ref 13.0–17.0)
Immature Granulocytes: 0 %
Lymphocytes Relative: 33 %
Lymphs Abs: 1.2 10*3/uL (ref 0.7–4.0)
MCH: 31.7 pg (ref 26.0–34.0)
MCHC: 32.6 g/dL (ref 30.0–36.0)
MCV: 97.3 fL (ref 80.0–100.0)
Monocytes Absolute: 0.4 10*3/uL (ref 0.1–1.0)
Monocytes Relative: 12 %
Neutro Abs: 2 10*3/uL (ref 1.7–7.7)
Neutrophils Relative %: 54 %
Platelets: 117 10*3/uL — ABNORMAL LOW (ref 150–400)
RBC: 4.01 MIL/uL — ABNORMAL LOW (ref 4.22–5.81)
RDW: 13.7 % (ref 11.5–15.5)
WBC: 3.6 10*3/uL — ABNORMAL LOW (ref 4.0–10.5)
nRBC: 0 % (ref 0.0–0.2)

## 2020-09-17 LAB — MAGNESIUM: Magnesium: 2 mg/dL (ref 1.7–2.4)

## 2020-09-17 LAB — LACTATE DEHYDROGENASE: LDH: 123 U/L (ref 98–192)

## 2020-09-17 MED ORDER — SODIUM CHLORIDE 0.9% FLUSH
10.0000 mL | Freq: Once | INTRAVENOUS | Status: AC | PRN
  Administered 2020-09-17: 10 mL

## 2020-09-17 MED ORDER — PROCHLORPERAZINE MALEATE 10 MG PO TABS
10.0000 mg | ORAL_TABLET | Freq: Once | ORAL | Status: AC
  Administered 2020-09-17: 10 mg via ORAL
  Filled 2020-09-17: qty 1

## 2020-09-17 MED ORDER — DARATUMUMAB-HYALURONIDASE-FIHJ 1800-30000 MG-UT/15ML ~~LOC~~ SOLN
1800.0000 mg | Freq: Once | SUBCUTANEOUS | Status: AC
  Administered 2020-09-17: 1800 mg via SUBCUTANEOUS
  Filled 2020-09-17: qty 15

## 2020-09-17 MED ORDER — HEPARIN SOD (PORK) LOCK FLUSH 100 UNIT/ML IV SOLN
500.0000 [IU] | Freq: Once | INTRAVENOUS | Status: AC | PRN
  Administered 2020-09-17: 500 [IU]

## 2020-09-17 MED ORDER — SODIUM CHLORIDE 0.9 % IV SOLN: Freq: Once | INTRAVENOUS | Status: AC

## 2020-09-17 MED ORDER — ACETAMINOPHEN 325 MG PO TABS
650.0000 mg | ORAL_TABLET | Freq: Once | ORAL | Status: AC
  Administered 2020-09-17: 650 mg via ORAL
  Filled 2020-09-17: qty 2

## 2020-09-17 MED ORDER — ZOLEDRONIC ACID 4 MG/100ML IV SOLN
4.0000 mg | Freq: Once | INTRAVENOUS | Status: AC
  Administered 2020-09-17: 4 mg via INTRAVENOUS
  Filled 2020-09-17: qty 100

## 2020-09-17 MED ORDER — DEXAMETHASONE 4 MG PO TABS
20.0000 mg | ORAL_TABLET | Freq: Once | ORAL | Status: AC
  Administered 2020-09-17: 20 mg via ORAL
  Filled 2020-09-17: qty 5

## 2020-09-17 MED ORDER — DIPHENHYDRAMINE HCL 25 MG PO CAPS
50.0000 mg | ORAL_CAPSULE | Freq: Once | ORAL | Status: AC
  Administered 2020-09-17: 50 mg via ORAL
  Filled 2020-09-17: qty 2

## 2020-09-17 NOTE — Progress Notes (Signed)
Patient has been assessed, vital signs and labs have been reviewed by Dr. Delton Coombes. ANC, Creatinine, LFTs, and Platelets are within treatment parameters per Dr. Delton Coombes. The patient is good to proceed with treatment at this time.  UA today due to burning with urination.  Primary RN and pharmacy aware.

## 2020-09-17 NOTE — Progress Notes (Signed)
Labs reviewed with MD today. Will proceed with treatment per MD today as well as Zometa infusion.   Treatment given per orders. Patient tolerated it well without problems. Vitals stable and discharged home from clinic via wheelchair. Follow up as scheduled.

## 2020-09-17 NOTE — Patient Instructions (Signed)
[facility]  Discharge Instructions: Thank you for choosing  Cancer Center to provide your oncology and hematology care.  If you have a lab appointment with the Cancer Center, please come in thru the Main Entrance and check in at the main information desk.  Wear comfortable clothing and clothing appropriate for easy access to any Portacath or PICC line.   We strive to give you quality time with your provider. You may need to reschedule your appointment if you arrive late (15 or more minutes).  Arriving late affects you and other patients whose appointments are after yours.  Also, if you miss three or more appointments without notifying the office, you may be dismissed from the clinic at the provider's discretion.      For prescription refill requests, have your pharmacy contact our office and allow 72 hours for refills to be completed.        To help prevent nausea and vomiting after your treatment, we encourage you to take your nausea medication as directed.  BELOW ARE SYMPTOMS THAT SHOULD BE REPORTED IMMEDIATELY: *FEVER GREATER THAN 100.4 F (38 C) OR HIGHER *CHILLS OR SWEATING *NAUSEA AND VOMITING THAT IS NOT CONTROLLED WITH YOUR NAUSEA MEDICATION *UNUSUAL SHORTNESS OF BREATH *UNUSUAL BRUISING OR BLEEDING *URINARY PROBLEMS (pain or burning when urinating, or frequent urination) *BOWEL PROBLEMS (unusual diarrhea, constipation, pain near the anus) TENDERNESS IN MOUTH AND THROAT WITH OR WITHOUT PRESENCE OF ULCERS (sore throat, sores in mouth, or a toothache) UNUSUAL RASH, SWELLING OR PAIN  UNUSUAL VAGINAL DISCHARGE OR ITCHING   Items with * indicate a potential emergency and should be followed up as soon as possible or go to the Emergency Department if any problems should occur.  Please show the CHEMOTHERAPY ALERT CARD or IMMUNOTHERAPY ALERT CARD at check-in to the Emergency Department and triage nurse.  Should you have questions after your visit or need to cancel  or reschedule your appointment, please contact  CANCER CENTER [phone]  and follow the prompts.  Office hours are 8:00 a.m. to 4:30 p.m. Monday - Friday. Please note that voicemails left after 4:00 p.m. may not be returned until the following business day.  We are closed weekends and major holidays. You have access to a nurse at all times for urgent questions. Please call the main number to the clinic [phone] and follow the prompts.  For any non-urgent questions, you may also contact your provider using MyChart. We now offer e-Visits for anyone 18 and older to request care online for non-urgent symptoms. For details visit [name].   Also download the MyChart app! Go to the app store, search "MyChart", open the app, select [facility], and log in with your MyChart username and password.  Due to Covid, a mask is required upon entering the hospital/clinic. If you do not have a mask, one will be given to you upon arrival. For doctor visits, patients may have 1 support person aged 18 or older with them. For treatment visits, patients cannot have anyone with them due to current Covid guidelines and our immunocompromised population.  

## 2020-09-17 NOTE — Patient Instructions (Addendum)
[place] at [facility] Discharge Instructions  You were seen today by Dr. Delton Coombes. He went over your recent results, and you received your treatment. Purchase melatonin tablets over the counter and take one 5 mg tablet before bedtime. Dr. Delton Coombes will see you back in 1 month for labs and follow up.   Thank you for choosing [place] at [facility] to provide your oncology and hematology care.  To afford each patient quality time with our provider, please arrive at least 15 minutes before your scheduled appointment time.   If you have a lab appointment with the [facility] please come in thru the Main Entrance and check in at the main information desk  You need to re-schedule your appointment should you arrive 10 or more minutes late.  We strive to give you quality time with our providers, and arriving late affects you and other patients whose appointments are after yours.  Also, if you no show three or more times for appointments you may be dismissed from the clinic at the providers discretion.     Again, thank you for choosing [name].  Our hope is that these requests will decrease the amount of time that you wait before being seen by our physicians.       _____________________________________________________________  Should you have questions after your visit to [facility], please contact our office at [phone] between the hours of 8:00 a.m. and 4:30 p.m.  Voicemails left after 4:00 p.m. will not be returned until the following business day.  For prescription refill requests, have your pharmacy contact our office and allow 72 hours.    Cancer Center Support Programs:   > Cancer Support Group  2nd Tuesday of the month 1pm-2pm, Journey Room

## 2020-09-18 ENCOUNTER — Other Ambulatory Visit: Payer: Self-pay

## 2020-09-18 ENCOUNTER — Encounter: Payer: Self-pay | Admitting: Family Medicine

## 2020-09-18 ENCOUNTER — Ambulatory Visit (INDEPENDENT_AMBULATORY_CARE_PROVIDER_SITE_OTHER): Payer: Medicare Other | Admitting: Family Medicine

## 2020-09-18 VITALS — BP 118/54 | HR 88 | Temp 99.4°F | Resp 16 | Ht 68.0 in | Wt 185.0 lb

## 2020-09-18 DIAGNOSIS — L89302 Pressure ulcer of unspecified buttock, stage 2: Secondary | ICD-10-CM

## 2020-09-18 LAB — KAPPA/LAMBDA LIGHT CHAINS
Kappa free light chain: 26.2 mg/L — ABNORMAL HIGH (ref 3.3–19.4)
Kappa, lambda light chain ratio: 7.08 — ABNORMAL HIGH (ref 0.26–1.65)
Lambda free light chains: 3.7 mg/L — ABNORMAL LOW (ref 5.7–26.3)

## 2020-09-18 NOTE — Progress Notes (Signed)
Subjective:    Patient ID: Dean [name]., male    DOB: 1935/06/15, 85 y.o.   MRN: [id number]  HPI Patient is a very pleasant 85 year old Caucasian gentleman who presents today complaining of pain around his tailbone.  Due to his age and deconditioning and frequent falls, he has been sitting more sometimes up to 15 hours a day.  Recently he has noticed a pain in the crease of his gluteus and the skin just over top of his coccyx.  On examination today, he has a 8 mm stage II pressure sore in the skin overlying the coccyx.  There is no surrounding erythema or warmth.  There is no fluctuance.  The sore itself is tender to touch with pressure.  It is a very shallow crater that is approximately 1 to 2 mm deep. Past Medical History:  Diagnosis Date  . BPH (benign prostatic hyperplasia)   . Colonic diverticular abscess 01/08/2015  . Diverticulitis 49/70/26   complicated by abscess and required percutaneous drainage  . GERD (gastroesophageal reflux disease)   . H/O ETOH abuse   . History of chemotherapy last done jan 2017  . History of radiation therapy 01/05/13-02/10/13   45 gray to left occipital condyle region  . History of radiation therapy 12/01/16-12/10/16   Parasternal nodule, chest- 24 Gy total delivered in 8 fractions, Left sacro-iliac, pelvis- 24 Gy total delivered in 8 fractions   . History of radiation therapy 02/19/17-02/22/17   right temporal scalp 30 Gy in 10 fractions  . Intra-abdominal abscess ([place])   . Multiple myeloma ([place]) 2015  . Neuropathy   . Radiation 02/21/14-03/08/14   right posterior chest wall area 30 gray  . Radiation 04/19/14-05/02/14   lumbar spine 25 gray  . Skull lesion    Left occipital condyle  . Wrist fracture, left    x 2   Past Surgical History:  Procedure Laterality Date  . COLONOSCOPY N/A 04/19/2015   Procedure: COLONOSCOPY;  Surgeon: Danie Binder, MD;  [facility];  Service: Endoscopy;  Laterality: N/A;  1:30 PM  . CYST EXCISION  1959    tail bone  . IR IMAGING GUIDED PORT INSERTION  09/25/2017   Current Outpatient Medications on File Prior to Visit  Medication Sig Dispense Refill  . acyclovir (ZOVIRAX) 400 MG tablet Take 1 tablet (400 mg total) by mouth 2 (two) times daily. 60 tablet 6  . ALPRAZolam (XANAX) 1 MG tablet TAKE 1 TABLET BY MOUTH 3 TIMES A DAY AS NEEDED FOR ANXIETY. 60 tablet 5  . CALCIUM-VITAMIN D PO Take 1 tablet by mouth 2 (two) times daily.     Marland Kitchen dexamethasone (DECADRON) 4 MG tablet     . fluticasone (FLONASE) 50 MCG/ACT nasal spray Place 2 sprays into both nostrils daily. 16 g 6  . furosemide (LASIX) 20 MG tablet TAKE 1 TABLET BY MOUTH ONCE A DAY AS NEEDED 30 tablet 3  . gabapentin (NEURONTIN) 400 MG capsule Take 1 capsule (400 mg total) by mouth 3 (three) times daily. 90 capsule 4  . Homeopathic Products ([place]) Apply topically as needed.    Marland Kitchen ibuprofen (ADVIL) 600 MG tablet Take 1 tablet (600 mg total) by mouth every 8 (eight) hours as needed. 30 tablet 0  . lidocaine-prilocaine (EMLA) cream Apply small amount over port one (1) hour prior to appointment. 30 g 0  . montelukast (SINGULAIR) 10 MG tablet TAKE 1 TABLET BY MOUTH EVERY NIGHT AT BEDTIME 30 tablet 3  .  omeprazole (PRILOSEC) 20 MG capsule TAKE 1 CAPSULE BY MOUTH ONCE DAILY 30 capsule 5  . oxybutynin (DITROPAN-XL) 10 MG 24 hr tablet TAKE 1 TABLET BY MOUTH EVERY NIGHT AT BEDTIME 30 tablet 3  . Oxycodone HCl 10 MG TABS Take 1 tablet (10 mg total) by mouth in the morning, at noon, and at bedtime. for pain 120 tablet 0  . senna-docusate (SENOKOT-S) 8.6-50 MG tablet Take 1 tablet by mouth 2 (two) times daily. 60 tablet 5  . silver sulfADIAZINE (SILVADENE) 1 % cream Apply 1 application topically daily. 50 g 0  . tamsulosin (FLOMAX) 0.4 MG CAPS capsule TAKE ONE CAPSULE BY MOUTH EVERY NIGHT ATBEDTIME 30 capsule 11  . traZODone (DESYREL) 100 MG tablet TAKE 3 TABLETS BY MOUTH AT BEDTIME 90 tablet 3  . triamcinolone cream (KENALOG) 0.1 % APPLY 1  APPLICATION TOPICALLY TWICE A DAY 30 g 0  . zolendronic acid (ZOMETA) 4 MG/5ML injection Inject 4 mg into the vein every 8 (eight) weeks.     No current facility-administered medications on file prior to visit.   Allergies  Allergen Reactions  . Codeine Other (See Comments)    Headache  . Morphine And Related Other (See Comments)    Makes him feel weird  . Revlimid [Lenalidomide] Other (See Comments)    "Causes me to become weak"   Social History   Socioeconomic History  . Marital status: Married    Spouse name: Not on file  . Number of children: 3  . Years of education: Not on file  . Highest education level: Not on file  Occupational History    Employer: RETIRED  Tobacco Use  . Smoking status: Never  . Smokeless tobacco: Never  Vaping Use  . Vaping Use: Never used  Substance and Sexual Activity  . Alcohol use: No    Comment: " Not much no more"  . Drug use: No  . Sexual activity: Not on file  Other Topics Concern  . Not on file  Social History Narrative  . Not on file   Social Determinants of Health   Financial Resource Strain: Low Risk   . Difficulty of Paying Living Expenses: Not very hard  Food Insecurity: No Food Insecurity  . Worried About Charity fundraiser in the Last Year: Never true  . Ran Out of Food in the Last Year: Never true  Transportation Needs: No Transportation Needs  . Lack of Transportation (Medical): No  . Lack of Transportation (Non-Medical): No  Physical Activity: Inactive  . Days of Exercise per Week: 0 days  . Minutes of Exercise per Session: 0 min  Stress: No Stress Concern Present  . Feeling of Stress : Not at all  Social Connections: Moderately Isolated  . Frequency of Communication with Friends and Family: More than three times a week  . Frequency of Social Gatherings with Friends and Family: Once a week  . Attends Religious Services: Never  . Active Member of Clubs or Organizations: No  . Attends Archivist  Meetings: Never  . Marital Status: Married  Human resources officer Violence: Not At Risk  . Fear of Current or Ex-Partner: No  . Emotionally Abused: No  . Physically Abused: No  . Sexually Abused: No      Review of Systems     Objective:   Physical Exam Vitals reviewed.  Constitutional:      General: He is not in acute distress.    Appearance: He is obese. He is not  ill-appearing or toxic-appearing.  Cardiovascular:     Rate and Rhythm: Normal rate and regular rhythm.     Heart sounds: Normal heart sounds.  Pulmonary:     Effort: Pulmonary effort is normal. No respiratory distress.     Breath sounds: Normal breath sounds. No wheezing, rhonchi or rales.  Musculoskeletal:     Lumbar back: No lacerations.       Back:  Neurological:     General: No focal deficit present.     Mental Status: He is alert and oriented to person, place, and time.     Cranial Nerves: No cranial nerve deficit.     Motor: No weakness.          Assessment & Plan:  Pressure injury of buttock, stage 2, unspecified laterality ([place]) I explained the pathophysiology of a pressure sore.  I recommended the patient get a hemorrhoid doughnut to sit on or a very thick cushion to sit on.  I recommended that he keep the area clean and dry and covered with antibiotic salve such as Polysporin.  I recommended he change positions every 2 hours to offload pressure and I anticipate the lesion will gradually heal over the next 2 to 3 weeks.  Recheck if worsening or a secondary infection develops.

## 2020-09-19 LAB — PROTEIN ELECTROPHORESIS, SERUM
A/G Ratio: 1.7 (ref 0.7–1.7)
Albumin ELP: 3 g/dL (ref 2.9–4.4)
Alpha-1-Globulin: 0.2 g/dL (ref 0.0–0.4)
Alpha-2-Globulin: 0.6 g/dL (ref 0.4–1.0)
Beta Globulin: 0.7 g/dL (ref 0.7–1.3)
Gamma Globulin: 0.3 g/dL — ABNORMAL LOW (ref 0.4–1.8)
Globulin, Total: 1.8 g/dL — ABNORMAL LOW (ref 2.2–3.9)
M-Spike, %: 0.2 g/dL — ABNORMAL HIGH
Total Protein ELP: 4.8 g/dL — ABNORMAL LOW (ref 6.0–8.5)

## 2020-09-27 NOTE — Progress Notes (Signed)
[facility]  Telephone:([phone] [phone]  ID: Dean [name]. OB: 05/23/1935  MR#: [id number]  TDS#:[id number]  Patient Care Team: [name], MD as PCP - General (Family Medicine) Gery Pray, MD as Consulting Physician (Radiation Oncology) Danie Binder, MD (Inactive) as Consulting Physician (Gastroenterology) Michael Boston, MD as Consulting Physician (General Surgery)  CHIEF COMPLAINT: Multiple myeloma  INTERVAL HISTORY: Patient is an 85 year old male with a longstanding history of recurrent multiple myeloma actively receiving treatment who has requested transfer to [facility] to have a shorter commute and be closer to his daughter who lives in [place], [place].  He has chronic weakness and fatigue, but otherwise appears to be tolerating his treatments well.  He has increased anxiety secondary to being the primary caretaker of his wife who has dementia.  He has no neurologic complaints.  He denies any recent fevers or illnesses.  He has a good appetite and denies weight loss.  He has no chest pain, shortness of breath, cough, or hemoptysis.  He denies any nausea, vomiting, constipation, or diarrhea.  He has no urinary complaints.  Patient offers no further specific complaints today.  REVIEW OF SYSTEMS:   Review of Systems  Constitutional:  Positive for malaise/fatigue. Negative for fever and weight loss.  Respiratory: Negative.  Negative for cough, hemoptysis and shortness of breath.   Cardiovascular: Negative.  Negative for chest pain and leg swelling.  Gastrointestinal: Negative.  Negative for abdominal pain and nausea.  Genitourinary: Negative.  Negative for dysuria.  Musculoskeletal:  Positive for back pain and falls.  Skin: Negative.  Negative for rash.  Neurological:  Positive for sensory change and weakness. Negative for dizziness, focal weakness and headaches.  Psychiatric/Behavioral:  The patient is nervous/anxious and has insomnia.     As per HPI. Otherwise, a complete review of systems is negative.  PAST MEDICAL HISTORY: Past Medical History:  Diagnosis Date   BPH (benign prostatic hyperplasia)    Colonic diverticular abscess 01/08/2015   Diverticulitis 26/20/35   complicated by abscess and required percutaneous drainage   GERD (gastroesophageal reflux disease)    H/O ETOH abuse    History of chemotherapy last done jan 2017   History of radiation therapy 01/05/13-02/10/13   45 gray to left occipital condyle region   History of radiation therapy 12/01/16-12/10/16   Parasternal nodule, chest- 24 Gy total delivered in 8 fractions, Left sacro-iliac, pelvis- 24 Gy total delivered in 8 fractions    History of radiation therapy 02/19/17-02/22/17   right temporal scalp 30 Gy in 10 fractions   Intra-abdominal abscess ([facility])    Multiple myeloma ([facility]) 2015   Neuropathy    Radiation 02/21/14-03/08/14   right posterior chest wall area 30 gray   Radiation 04/19/14-05/02/14   lumbar spine 25 gray   Skull lesion    Left occipital condyle   Wrist fracture, left    x 2    PAST SURGICAL HISTORY: Past Surgical History:  Procedure Laterality Date   COLONOSCOPY N/A 04/19/2015   Procedure: COLONOSCOPY;  Surgeon: Danie Binder, MD;  [facility];  Service: Endoscopy;  Laterality: N/A;  1:30 PM   CYST EXCISION  1959   tail bone   IR IMAGING GUIDED PORT INSERTION  09/25/2017    FAMILY HISTORY: Family History  Problem Relation Age of Onset   Diabetes Mother    Stroke Mother    Hypertension Father     ADVANCED DIRECTIVES (Y/N):  N  HEALTH MAINTENANCE: Social  History   Tobacco Use   Smoking status: Never   Smokeless tobacco: Never  Vaping Use   Vaping Use: Never used  Substance Use Topics   Alcohol use: No    Comment: " Not much no more"   Drug use: No     Colonoscopy:  PAP:  Bone density:  Lipid panel:  Allergies  Allergen Reactions   Codeine Other (See Comments)    Headache   Morphine And Related  Other (See Comments)    Makes him feel weird   Revlimid [Lenalidomide] Other (See Comments)    "Causes me to become weak"    Current Outpatient Medications  Medication Sig Dispense Refill   acyclovir (ZOVIRAX) 400 MG tablet Take 1 tablet (400 mg total) by mouth 2 (two) times daily. 60 tablet 6   ALPRAZolam (XANAX) 1 MG tablet TAKE 1 TABLET BY MOUTH 3 TIMES A DAY AS NEEDED FOR ANXIETY. 60 tablet 5   CALCIUM-VITAMIN D PO Take 1 tablet by mouth 2 (two) times daily.      dexamethasone (DECADRON) 4 MG tablet      fluticasone (FLONASE) 50 MCG/ACT nasal spray Place 2 sprays into both nostrils daily. 16 g 6   furosemide (LASIX) 20 MG tablet TAKE 1 TABLET BY MOUTH ONCE A DAY AS NEEDED 30 tablet 3   gabapentin (NEURONTIN) 400 MG capsule Take 1 capsule (400 mg total) by mouth 3 (three) times daily. 90 capsule 4   Homeopathic Products (THERAWORX RELIEF EX) Apply topically as needed.     ibuprofen (ADVIL) 600 MG tablet Take 1 tablet (600 mg total) by mouth every 8 (eight) hours as needed. 30 tablet 0   lidocaine-prilocaine (EMLA) cream Apply small amount over port one (1) hour prior to appointment. 30 g 0   montelukast (SINGULAIR) 10 MG tablet TAKE 1 TABLET BY MOUTH EVERY NIGHT AT BEDTIME 30 tablet 3   omeprazole (PRILOSEC) 20 MG capsule TAKE 1 CAPSULE BY MOUTH ONCE DAILY 30 capsule 5   senna-docusate (SENOKOT-S) 8.6-50 MG tablet Take 1 tablet by mouth 2 (two) times daily. 60 tablet 5   silver sulfADIAZINE (SILVADENE) 1 % cream Apply 1 application topically daily. 50 g 0   triamcinolone cream (KENALOG) 0.1 % APPLY 1 APPLICATION TOPICALLY TWICE A DAY 30 g 0   TURMERIC PO Take by mouth.     zolendronic acid (ZOMETA) 4 MG/5ML injection Inject 4 mg into the vein every 8 (eight) weeks.     oxybutynin (DITROPAN-XL) 10 MG 24 hr tablet Take 1 tablet (10 mg total) by mouth at bedtime. 30 tablet 1   Oxycodone HCl 10 MG TABS TAKE 1 TABLET BY MOUTH IN THE MORNING ATNOON AND AT BEDTIME FOR PAIN 120 tablet 0    tamsulosin (FLOMAX) 0.4 MG CAPS capsule TAKE ONE CAPSULE BY MOUTH EVERY NIGHT ATBEDTIME (Patient not taking: Reported on 10/02/2020) 30 capsule 11   traZODone (DESYREL) 100 MG tablet TAKE 3 TABLETS BY MOUTH AT BEDTIME (Patient not taking: Reported on 10/02/2020) 90 tablet 3   No current facility-administered medications for this visit.    OBJECTIVE: Vitals:   10/02/20 1121  BP: 114/66  Pulse: 80  Resp: 16  Temp: 98 F (36.7 C)  SpO2: 99%     Body mass index is 27.22 kg/m.    ECOG FS:1 - Symptomatic but completely ambulatory  General: Well-developed, well-nourished, no acute distress. Eyes: Pink conjunctiva, anicteric sclera. HEENT: Normocephalic, moist mucous membranes. Lungs: No audible wheezing or coughing. Heart: Regular rate and  rhythm. Abdomen: Soft, nontender, no obvious distention. Musculoskeletal: No edema, cyanosis, or clubbing. Neuro: Alert, answering all questions appropriately. Cranial nerves grossly intact. Skin: No rashes or petechiae noted. Psych: Normal affect.  LAB RESULTS:  Lab Results  Component Value Date   NA 139 09/17/2020   K 3.8 09/17/2020   CL 106 09/17/2020   CO2 28 09/17/2020   GLUCOSE 93 09/17/2020   BUN 20 09/17/2020   CREATININE 0.70 09/17/2020   CALCIUM 8.2 (L) 09/17/2020   PROT 5.3 (L) 09/17/2020   ALBUMIN 3.3 (L) 09/17/2020   AST 13 (L) 09/17/2020   ALT 11 09/17/2020   ALKPHOS 18 (L) 09/17/2020   BILITOT 0.8 09/17/2020   GFRNONAA >60 09/17/2020   GFRAA >60 10/13/2019    Lab Results  Component Value Date   WBC 3.6 (L) 09/17/2020   NEUTROABS 2.0 09/17/2020   HGB 12.7 (L) 09/17/2020   HCT 39.0 09/17/2020   MCV 97.3 09/17/2020   PLT 117 (L) 09/17/2020     STUDIES: No results found.  ASSESSMENT: Multiple myeloma  PLAN:  Multiple myeloma: Please see note from September 17, 2020 for details of his myeloma and treatment up to this point.  His most recent M spike was chronic and unchanged at 0.2 with a kappa free light chain  mildly elevated at 26.2.  He is currently receiving maintenance Darzalex every 4 weeks and Zometa every 8 weeks with his last treatment occurring on September 17, 2020.  Continue current treatment.  Patient will return to clinic in 2 weeks for cycle 36 and then in 6 weeks for further evaluation and consideration of cycle 37.  We also discussed the possibility of discontinuing treatments which patient expressed interest in in the future. Pancytopenia: Chronic and unchanged.  Monitor. Falls: Monitor.  Consider discontinuation of treatment as above. Back pain: Continue oxycodone as prescribed.  Patient was given a refill today. Peripheral neuropathy: Continue gabapentin as prescribed. Insomnia/anxiety: Continue Xanax and trazodone as needed.   Patient expressed understanding and was in agreement with this plan. He also understands that He can call clinic at any time with any questions, concerns, or complaints.   Cancer Staging No matching staging information was found for the patient.  Lloyd Huger, MD   10/03/2020 10:53 AM

## 2020-10-02 ENCOUNTER — Inpatient Hospital Stay (HOSPITAL_BASED_OUTPATIENT_CLINIC_OR_DEPARTMENT_OTHER): Payer: Medicare Other | Admitting: Oncology

## 2020-10-02 ENCOUNTER — Other Ambulatory Visit (HOSPITAL_COMMUNITY): Payer: Self-pay | Admitting: Oncology

## 2020-10-02 ENCOUNTER — Inpatient Hospital Stay: Payer: Medicare Other

## 2020-10-02 ENCOUNTER — Encounter: Payer: Self-pay | Admitting: Oncology

## 2020-10-02 VITALS — BP 114/66 | HR 80 | Temp 98.0°F | Resp 16 | Ht 68.0 in | Wt 179.0 lb

## 2020-10-02 DIAGNOSIS — D61818 Other pancytopenia: Secondary | ICD-10-CM

## 2020-10-02 DIAGNOSIS — G4700 Insomnia, unspecified: Secondary | ICD-10-CM | POA: Diagnosis not present

## 2020-10-02 DIAGNOSIS — Z9181 History of falling: Secondary | ICD-10-CM | POA: Diagnosis not present

## 2020-10-02 DIAGNOSIS — M549 Dorsalgia, unspecified: Secondary | ICD-10-CM

## 2020-10-02 DIAGNOSIS — Z79899 Other long term (current) drug therapy: Secondary | ICD-10-CM | POA: Diagnosis not present

## 2020-10-02 DIAGNOSIS — C9000 Multiple myeloma not having achieved remission: Secondary | ICD-10-CM | POA: Diagnosis not present

## 2020-10-02 DIAGNOSIS — G629 Polyneuropathy, unspecified: Secondary | ICD-10-CM

## 2020-10-02 DIAGNOSIS — G4730 Sleep apnea, unspecified: Secondary | ICD-10-CM

## 2020-10-02 DIAGNOSIS — Z923 Personal history of irradiation: Secondary | ICD-10-CM

## 2020-10-02 DIAGNOSIS — Z5112 Encounter for antineoplastic immunotherapy: Secondary | ICD-10-CM | POA: Diagnosis not present

## 2020-10-02 MED ORDER — OXYBUTYNIN CHLORIDE ER 10 MG PO TB24: 10.0000 mg | ORAL_TABLET | Freq: Every day | ORAL | 1 refills | Status: DC

## 2020-10-03 ENCOUNTER — Encounter (HOSPITAL_COMMUNITY): Payer: Self-pay | Admitting: Hematology

## 2020-10-12 ENCOUNTER — Other Ambulatory Visit: Payer: Self-pay

## 2020-10-12 DIAGNOSIS — C9000 Multiple myeloma not having achieved remission: Secondary | ICD-10-CM

## 2020-10-15 ENCOUNTER — Ambulatory Visit (HOSPITAL_COMMUNITY): Payer: Medicare Other

## 2020-10-15 ENCOUNTER — Other Ambulatory Visit (HOSPITAL_COMMUNITY): Payer: Medicare Other

## 2020-10-15 ENCOUNTER — Inpatient Hospital Stay: Payer: Medicare Other

## 2020-10-15 ENCOUNTER — Inpatient Hospital Stay: Payer: Medicare Other | Attending: Hematology and Oncology

## 2020-10-15 ENCOUNTER — Ambulatory Visit (HOSPITAL_COMMUNITY): Payer: Medicare Other | Admitting: Hematology

## 2020-10-15 VITALS — BP 129/74 | HR 66 | Temp 97.0°F | Resp 18 | Wt 178.0 lb

## 2020-10-15 DIAGNOSIS — C9000 Multiple myeloma not having achieved remission: Secondary | ICD-10-CM | POA: Diagnosis not present

## 2020-10-15 DIAGNOSIS — Z79899 Other long term (current) drug therapy: Secondary | ICD-10-CM | POA: Insufficient documentation

## 2020-10-15 DIAGNOSIS — Z5112 Encounter for antineoplastic immunotherapy: Secondary | ICD-10-CM | POA: Diagnosis not present

## 2020-10-15 LAB — COMPREHENSIVE METABOLIC PANEL
ALT: 10 U/L (ref 0–44)
AST: 17 U/L (ref 15–41)
Albumin: 3.8 g/dL (ref 3.5–5.0)
Alkaline Phosphatase: 20 U/L — ABNORMAL LOW (ref 38–126)
Anion gap: 5 (ref 5–15)
BUN: 18 mg/dL (ref 8–23)
CO2: 30 mmol/L (ref 22–32)
Calcium: 8.9 mg/dL (ref 8.9–10.3)
Chloride: 106 mmol/L (ref 98–111)
Creatinine, Ser: 0.76 mg/dL (ref 0.61–1.24)
GFR, Estimated: >60 mL/min (ref >60)
Glucose, Bld: 100 mg/dL — ABNORMAL HIGH (ref 70–99)
Potassium: 4 mmol/L (ref 3.5–5.1)
Sodium: 141 mmol/L (ref 135–145)
Total Bilirubin: 0.7 mg/dL (ref 0.3–1.2)
Total Protein: 5.7 g/dL — ABNORMAL LOW (ref 6.5–8.1)

## 2020-10-15 LAB — CBC WITH DIFFERENTIAL/PLATELET
Abs Immature Granulocytes: 0.01 10*3/uL (ref 0.00–0.07)
Basophils Absolute: 0 10*3/uL (ref 0.0–0.1)
Basophils Relative: 0 %
Eosinophils Absolute: 0 10*3/uL (ref 0.0–0.5)
Eosinophils Relative: 0 %
HCT: 42.7 % (ref 39.0–52.0)
Hemoglobin: 14.4 g/dL (ref 13.0–17.0)
Immature Granulocytes: 0 %
Lymphocytes Relative: 32 %
Lymphs Abs: 1.7 10*3/uL (ref 0.7–4.0)
MCH: 31.5 pg (ref 26.0–34.0)
MCHC: 33.7 g/dL (ref 30.0–36.0)
MCV: 93.4 fL (ref 80.0–100.0)
Monocytes Absolute: 0.5 10*3/uL (ref 0.1–1.0)
Monocytes Relative: 10 %
Neutro Abs: 3.1 10*3/uL (ref 1.7–7.7)
Neutrophils Relative %: 58 %
Platelets: 131 10*3/uL — ABNORMAL LOW (ref 150–400)
RBC: 4.57 MIL/uL (ref 4.22–5.81)
RDW: 13 % (ref 11.5–15.5)
WBC: 5.4 10*3/uL (ref 4.0–10.5)
nRBC: 0 % (ref 0.0–0.2)

## 2020-10-15 LAB — MAGNESIUM: Magnesium: 2.2 mg/dL (ref 1.7–2.4)

## 2020-10-15 MED ORDER — HEPARIN SOD (PORK) LOCK FLUSH 100 UNIT/ML IV SOLN
500.0000 [IU] | Freq: Once | INTRAVENOUS | Status: AC
  Filled 2020-10-15: qty 5

## 2020-10-15 MED ORDER — SODIUM CHLORIDE 0.9% FLUSH
10.0000 mL | Freq: Once | INTRAVENOUS | Status: AC
  Administered 2020-10-15: 10 mL via INTRAVENOUS
  Filled 2020-10-15: qty 10

## 2020-10-15 MED ORDER — HEPARIN SOD (PORK) LOCK FLUSH 100 UNIT/ML IV SOLN
INTRAVENOUS | Status: AC
  Administered 2020-10-15: 500 [IU]
  Filled 2020-10-15: qty 5

## 2020-10-15 MED ORDER — DEXAMETHASONE 4 MG PO TABS
20.0000 mg | ORAL_TABLET | Freq: Once | ORAL | Status: AC
  Administered 2020-10-15: 20 mg via ORAL
  Filled 2020-10-15: qty 5

## 2020-10-15 MED ORDER — ACETAMINOPHEN 325 MG PO TABS
650.0000 mg | ORAL_TABLET | Freq: Once | ORAL | Status: AC
  Administered 2020-10-15: 650 mg via ORAL
  Filled 2020-10-15: qty 2

## 2020-10-15 MED ORDER — PROCHLORPERAZINE MALEATE 10 MG PO TABS
10.0000 mg | ORAL_TABLET | Freq: Once | ORAL | Status: AC
  Administered 2020-10-15: 10 mg via ORAL
  Filled 2020-10-15: qty 1

## 2020-10-15 MED ORDER — DIPHENHYDRAMINE HCL 25 MG PO CAPS
25.0000 mg | ORAL_CAPSULE | Freq: Once | ORAL | Status: AC
  Administered 2020-10-15: 25 mg via ORAL
  Filled 2020-10-15: qty 1

## 2020-10-15 MED ORDER — DARATUMUMAB-HYALURONIDASE-FIHJ 1800-30000 MG-UT/15ML ~~LOC~~ SOLN
1800.0000 mg | Freq: Once | SUBCUTANEOUS | Status: AC
  Administered 2020-10-15: 1800 mg via SUBCUTANEOUS
  Filled 2020-10-15: qty 15

## 2020-10-15 MED ORDER — HEPARIN SOD (PORK) LOCK FLUSH 100 UNIT/ML IV SOLN
500.0000 [IU] | Freq: Once | INTRAVENOUS | Status: DC | PRN
  Filled 2020-10-15: qty 5

## 2020-10-15 NOTE — Patient Instructions (Signed)
Daratumumab injection What is this medication? DARATUMUMAB (dar a toom ue mab) is a monoclonal antibody. It is used to treat multiple myeloma. This medicine may be used for other purposes; ask your health care provider or pharmacist if you have questions. COMMON BRAND NAME(S): DARZALEX What should I tell my care team before I take this medication? They need to know if you have any of these conditions: hereditary fructose intolerance infection (especially a virus infection such as chickenpox, herpes, or hepatitis B virus) lung or breathing disease (asthma, COPD) an unusual or allergic reaction to daratumumab, sorbitol, other medicines, foods, dyes, or preservatives pregnant or trying to get pregnant breast-feeding How should I use this medication? This medicine is for infusion into a vein. It is given by a health care professional in a hospital or clinic setting. Talk to your pediatrician regarding the use of this medicine in children. Special care may be needed. Overdosage: If you think you have taken too much of this medicine contact a poison control center or emergency room at once. NOTE: This medicine is only for you. Do not share this medicine with others. What if I miss a dose? Keep appointments for follow-up doses as directed. It is important not to miss your dose. Call your doctor or health care professional if you are unable to keep an appointment. What may interact with this medication? Interactions have not been studied. This list may not describe all possible interactions. Give your health care provider a list of all the medicines, herbs, non-prescription drugs, or dietary supplements you use. Also tell them if you smoke, drink alcohol, or use illegal drugs. Some items may interact with your medicine. What should I watch for while using this medication? Your condition will be monitored carefully while you are receiving this medicine. This medicine can cause serious allergic  reactions. To reduce your risk, your health care provider may give you other medicine to take before receiving this one. Be sure to follow the directions from your health care provider. This medicine can affect the results of blood tests to match your blood type. These changes can last for up to 6 months after the final dose. Your healthcare provider will do blood tests to match your blood type before you start treatment. Tell all of your healthcare providers that you are being treated with this medicine before receiving a blood transfusion. This medicine can affect the results of some tests used to determine treatment response; extra tests may be needed to evaluate response. Do not become pregnant while taking this medicine or for 3 months after stopping it. Women should inform their health care provider if they wish to become pregnant or think they might be pregnant. There is a potential for serious side effects to an unborn child. Talk to your health care provider for more information. Do not breast-feed an infant while taking this medicine. What side effects may I notice from receiving this medication? Side effects that you should report to your doctor or health care professional as soon as possible: allergic reactions (skin rash, itching, hives, swelling of the face, lips, or tongue) blurred vision infection (fever, chills, cough, sore throat, pain or difficulty passing urine) infusion reaction (dizziness, fast heartbeat, feeling faint or lightheaded, falls, headache, increase in blood pressure, nausea, vomiting, or wheezing or trouble breathing with loud or whistling sounds) unusual bleeding or bruising Side effects that usually do not require medical attention (report to your doctor or health care professional if they continue or are bothersome):  constipation diarrhea pain, tingling, numbness in the hands or feet swelling of the ankles, feet, hands tiredness This list may not describe all  possible side effects. Call your doctor for medical advice about side effects. You may report side effects to FDA at 1-800-FDA-1088. Where should I keep my medication? This drug is given in a hospital or clinic and will not be stored at home. NOTE: This sheet is a summary. It may not cover all possible information. If you have questions about this medicine, talk to your doctor, pharmacist, or health care provider.  2022 Elsevier/Gold Standard (2020-03-01 12:50:38)

## 2020-10-16 LAB — IGG, IGA, IGM
IgA: 26 mg/dL — ABNORMAL LOW (ref 61–437)
IgG (Immunoglobin G), Serum: 343 mg/dL — ABNORMAL LOW (ref 603–1613)
IgM (Immunoglobulin M), Srm: 14 mg/dL — ABNORMAL LOW (ref 15–143)

## 2020-10-16 LAB — KAPPA/LAMBDA LIGHT CHAINS
Kappa free light chain: 32.3 mg/L — ABNORMAL HIGH (ref 3.3–19.4)
Kappa, lambda light chain ratio: 10.09 — ABNORMAL HIGH (ref 0.26–1.65)
Lambda free light chains: 3.2 mg/L — ABNORMAL LOW (ref 5.7–26.3)

## 2020-10-17 LAB — PROTEIN ELECTROPHORESIS, SERUM
A/G Ratio: 2.1 — ABNORMAL HIGH (ref 0.7–1.7)
Albumin ELP: 3.6 g/dL (ref 2.9–4.4)
Alpha-1-Globulin: 0.2 g/dL (ref 0.0–0.4)
Alpha-2-Globulin: 0.5 g/dL (ref 0.4–1.0)
Beta Globulin: 0.6 g/dL — ABNORMAL LOW (ref 0.7–1.3)
Gamma Globulin: 0.3 g/dL — ABNORMAL LOW (ref 0.4–1.8)
Globulin, Total: 1.7 g/dL — ABNORMAL LOW (ref 2.2–3.9)
M-Spike, %: 0.2 g/dL — ABNORMAL HIGH
Total Protein ELP: 5.3 g/dL — ABNORMAL LOW (ref 6.0–8.5)

## 2020-10-22 ENCOUNTER — Telehealth: Payer: Self-pay | Admitting: Family Medicine

## 2020-10-22 ENCOUNTER — Other Ambulatory Visit: Payer: Self-pay | Admitting: Family Medicine

## 2020-10-22 MED ORDER — CEPHALEXIN 500 MG PO CAPS: 500.0000 mg | ORAL_CAPSULE | Freq: Three times a day (TID) | ORAL | 0 refills | Status: DC

## 2020-10-22 NOTE — Telephone Encounter (Signed)
Patient called to follow up on last appointment on 8/16. Stated area near [place] still very sore and he can't walk a lot; sits a lot. Requesting provider call in an antibiotic.   Pharmacy confirmed as   [facility] [place] - [address]  [place], [place] [address]  Phone:  [phone]  Fax:  [phone]   Please advise at [phone], or [phone]

## 2020-10-22 NOTE — Telephone Encounter (Signed)
Please advise 

## 2020-10-31 ENCOUNTER — Ambulatory Visit (INDEPENDENT_AMBULATORY_CARE_PROVIDER_SITE_OTHER): Payer: Medicare Other | Admitting: Nurse Practitioner

## 2020-10-31 ENCOUNTER — Encounter: Payer: Self-pay | Admitting: Nurse Practitioner

## 2020-10-31 ENCOUNTER — Other Ambulatory Visit: Payer: Self-pay

## 2020-10-31 VITALS — BP 118/80 | HR 73 | Temp 97.9°F | Ht 68.0 in | Wt 172.2 lb

## 2020-10-31 DIAGNOSIS — J301 Allergic rhinitis due to pollen: Secondary | ICD-10-CM

## 2020-10-31 MED ORDER — FLUTICASONE PROPIONATE 50 MCG/ACT NA SUSP: 2.0000 | Freq: Every day | NASAL | 6 refills | Status: DC

## 2020-10-31 NOTE — Progress Notes (Signed)
Subjective:    Patient ID: Dean Danker., male    DOB: [birth year], 85 y.o.   MRN: [id number]  HPI: Dean Agostino. is a 85 y.o. male presenting with wife for ear pain.  Chief Complaint  Patient presents with   Ear Pain   EAR PAIN Duration: months Involved ear(s): right Severity:  ranges moderate to severe  Quality:  sharp Fever: no Otorrhea: no Upper respiratory infection symptoms:  yes; congestion sometimes Pruritus: no Hearing loss: no Water immersion no Recurrent otitis media: no Status: stable Treatments attempted: aspirin  Allergies  Allergen Reactions   Codeine Other (See Comments)    Headache   Morphine And Related Other (See Comments)    Makes him feel weird   Revlimid [Lenalidomide] Other (See Comments)    "Causes me to become weak"    Outpatient Encounter Medications as of 10/31/2020  Medication Sig   acyclovir (ZOVIRAX) 400 MG tablet Take 1 tablet (400 mg total) by mouth 2 (two) times daily.   ALPRAZolam (XANAX) 1 MG tablet TAKE 1 TABLET BY MOUTH 3 TIMES A DAY AS NEEDED FOR ANXIETY.   CALCIUM-VITAMIN D PO Take 1 tablet by mouth 2 (two) times daily.    cephALEXin (KEFLEX) 500 MG capsule Take 1 capsule (500 mg total) by mouth 3 (three) times daily.   dexamethasone (DECADRON) 4 MG tablet    fluticasone (FLONASE) 50 MCG/ACT nasal spray Place 2 sprays into both nostrils daily.   furosemide (LASIX) 20 MG tablet TAKE 1 TABLET BY MOUTH ONCE A DAY AS NEEDED   gabapentin (NEURONTIN) 400 MG capsule Take 1 capsule (400 mg total) by mouth 3 (three) times daily.   Homeopathic Products ([place]) Apply topically as needed.   ibuprofen (ADVIL) 600 MG tablet Take 1 tablet (600 mg total) by mouth every 8 (eight) hours as needed.   lidocaine-prilocaine (EMLA) cream Apply small amount over port one (1) hour prior to appointment.   montelukast (SINGULAIR) 10 MG tablet TAKE 1 TABLET BY MOUTH EVERY NIGHT AT BEDTIME   omeprazole (PRILOSEC) 20 MG capsule TAKE  1 CAPSULE BY MOUTH ONCE DAILY   oxybutynin (DITROPAN-XL) 10 MG 24 hr tablet Take 1 tablet (10 mg total) by mouth at bedtime.   Oxycodone HCl 10 MG TABS TAKE 1 TABLET BY MOUTH IN THE MORNING ATNOON AND AT BEDTIME FOR PAIN   senna-docusate (SENOKOT-S) 8.6-50 MG tablet Take 1 tablet by mouth 2 (two) times daily.   silver sulfADIAZINE (SILVADENE) 1 % cream Apply 1 application topically daily.   tamsulosin (FLOMAX) 0.4 MG CAPS capsule TAKE ONE CAPSULE BY MOUTH EVERY NIGHT ATBEDTIME (Patient not taking: Reported on 10/02/2020)   traZODone (DESYREL) 100 MG tablet TAKE 3 TABLETS BY MOUTH AT BEDTIME (Patient not taking: Reported on 10/02/2020)   triamcinolone cream (KENALOG) 0.1 % APPLY 1 APPLICATION TOPICALLY TWICE A DAY   TURMERIC PO Take by mouth.   zolendronic acid (ZOMETA) 4 MG/5ML injection Inject 4 mg into the vein every 8 (eight) weeks.   [DISCONTINUED] fluticasone (FLONASE) 50 MCG/ACT nasal spray Place 2 sprays into both nostrils daily.   Facility-Administered Encounter Medications as of 10/31/2020  Medication   heparin lock flush 100 unit/mL   heparin lock flush 100 unit/mL    Patient Active Problem List   Diagnosis Date Noted   Spinal stenosis, lumbar 10/13/2016   Chronic back pain 10/01/2016   DDD (degenerative disc disease), lumbar 10/01/2016   OA (osteoarthritis) of knee 10/01/2016   Diverticulitis  s/p robotic sigmoid colectomy 07/11/2015 07/11/2015   Goals of care, counseling/discussion    Abnormal CT scan, sigmoid colon 04/02/2015   Abnormal PET scan of colon 04/02/2015   Thrombocytopenia ([place]) 01/09/2015   Multiple myeloma without remission ([place]) 02/15/2014   Allergic rhinitis 01/16/2014   History of vertebral stress fracture 01/16/2014   Dermatitis 01/16/2014   Loss of weight 09/05/2013   Insomnia 09/05/2013   Fatigue 05/16/2013   Neck pain 12/07/2012   Skull lesion 11/29/2012   Neuropathy ([place])    BPH (benign prostatic hyperplasia)    GERD (gastroesophageal reflux  disease)    H/O ETOH abuse     Past Medical History:  Diagnosis Date   BPH (benign prostatic hyperplasia)    Colonic diverticular abscess 01/08/2015   Diverticulitis 45/80/99   complicated by abscess and required percutaneous drainage   GERD (gastroesophageal reflux disease)    H/O ETOH abuse    History of chemotherapy last done jan 2017   History of radiation therapy 01/05/13-02/10/13   45 gray to left occipital condyle region   History of radiation therapy 12/01/16-12/10/16   Parasternal nodule, chest- 24 Gy total delivered in 8 fractions, Left sacro-iliac, pelvis- 24 Gy total delivered in 8 fractions    History of radiation therapy 02/19/17-02/22/17   right temporal scalp 30 Gy in 10 fractions   Intra-abdominal abscess ([place])    Multiple myeloma ([place]) 2015   Neuropathy    Radiation 02/21/14-03/08/14   right posterior chest wall area 30 gray   Radiation 04/19/14-05/02/14   lumbar spine 25 gray   Skull lesion    Left occipital condyle   Wrist fracture, left    x 2    Relevant past medical, surgical, family and social history reviewed and updated as indicated. Interim medical history since our last visit reviewed.  Review of Systems  Constitutional:  Negative for activity change, appetite change, chills, fatigue and fever.  HENT:  Positive for congestion, postnasal drip, sinus pressure and sinus pain. Negative for ear discharge, ear pain, hearing loss, nosebleeds, rhinorrhea, sneezing and sore throat.   Eyes: Negative.  Negative for pain, discharge, redness and itching.  Respiratory: Negative.  Negative for cough, shortness of breath and wheezing.   Cardiovascular: Negative.  Negative for chest pain and palpitations.  Neurological:  Positive for headaches. Negative for dizziness.  Psychiatric/Behavioral: Negative.    Per HPI unless specifically indicated above     Objective:    BP 118/80   Pulse 73   Temp 97.9 F (36.6 C) (Oral)   Ht 5' 8"  (1.727 m)   Wt 172 lb 3.2 oz (78.1  kg)   BMI 26.18 kg/m   Wt Readings from Last 3 Encounters:  10/31/20 172 lb 3.2 oz (78.1 kg)  10/15/20 178 lb (80.7 kg)  10/02/20 179 lb (81.2 kg)    Physical Exam Vitals and nursing note reviewed.  Constitutional:      General: He is not in acute distress.    Appearance: Normal appearance. He is not toxic-appearing.  HENT:     Head: Normocephalic and atraumatic.     Right Ear: Tympanic membrane, ear canal and external ear normal. There is no impacted cerumen.     Left Ear: Tympanic membrane, ear canal and external ear normal. There is no impacted cerumen.     Nose: No congestion or rhinorrhea.     Right Turbinates: Swollen.     Left Turbinates: Swollen.     Mouth/Throat:     Mouth: Mucous  membranes are moist.     Pharynx: Oropharynx is clear. Uvula midline. Posterior oropharyngeal erythema present. No oropharyngeal exudate or uvula swelling.  Cardiovascular:     Rate and Rhythm: Normal rate and regular rhythm.     Heart sounds: Normal heart sounds. No murmur heard. Pulmonary:     Effort: Pulmonary effort is normal. No respiratory distress.     Breath sounds: Normal breath sounds. No wheezing, rhonchi or rales.  Musculoskeletal:     Cervical back: Normal range of motion.  Lymphadenopathy:     Cervical: No cervical adenopathy.  Neurological:     Mental Status: He is alert and oriented to person, place, and time.     Gait: Gait abnormal (walks with cane).  Psychiatric:        Mood and Affect: Mood normal.        Behavior: Behavior normal.        Thought Content: Thought content normal.        Judgment: Judgment normal.      Assessment & Plan:   Problem List Items Addressed This Visit       Respiratory   Allergic rhinitis - Primary    Suspect allergic rhinitis vs. Eustachian tube dysfunction.  No virla symptoms today.  Resume flonase. If ear pain no better after a couple of weeks, can start daily antihistamine like Allegra.  Follow up if symptoms do not improve.        Relevant Medications   fluticasone (FLONASE) 50 MCG/ACT nasal spray     Follow up plan: Return if symptoms worsen or fail to improve.

## 2020-10-31 NOTE — Assessment & Plan Note (Signed)
Suspect allergic rhinitis vs. Eustachian tube dysfunction.  No virla symptoms today.  Resume flonase. If ear pain no better after a couple of weeks, can start daily antihistamine like Allegra.  Follow up if symptoms do not improve.

## 2020-11-07 ENCOUNTER — Other Ambulatory Visit: Payer: Self-pay | Admitting: Oncology

## 2020-11-07 DIAGNOSIS — C9000 Multiple myeloma not having achieved remission: Secondary | ICD-10-CM

## 2020-11-12 NOTE — Progress Notes (Signed)
[facility]  Telephone:([phone] [phone]  ID: Dean [name]. OB: 04/11/1935  MR#: [id number]  MSX#:[id number]  Patient Care Team: [name], MD as PCP - General (Family Medicine) Gery Pray, MD as Consulting Physician (Radiation Oncology) Danie Binder, MD (Inactive) as Consulting Physician (Gastroenterology) Michael Boston, MD as Consulting Physician (General Surgery)  CHIEF COMPLAINT: Multiple myeloma  INTERVAL HISTORY: Patient returns to clinic today for repeat laboratory work, further evaluation, and continuation of Darzalex and Zometa.  He had a recent fall and still feels soreness in his right hip, but otherwise feels well and is asymptomatic.  He continues to tolerate his treatments without significant side effects.  He has anxiety secondary to being the primary caretaker of his wife who has dementia.  He has no neurologic complaints.  He denies any recent fevers or illnesses.  He has a good appetite and denies weight loss.  He has no chest pain, shortness of breath, cough, or hemoptysis.  He denies any nausea, vomiting, constipation, or diarrhea.  He has no urinary complaints.  Patient offers no further specific complaints today.  REVIEW OF SYSTEMS:   Review of Systems  Constitutional:  Positive for malaise/fatigue. Negative for fever and weight loss.  Respiratory: Negative.  Negative for cough, hemoptysis and shortness of breath.   Cardiovascular: Negative.  Negative for chest pain and leg swelling.  Gastrointestinal: Negative.  Negative for abdominal pain and nausea.  Genitourinary: Negative.  Negative for dysuria.  Musculoskeletal:  Positive for falls and joint pain. Negative for back pain.  Skin: Negative.  Negative for rash.  Neurological:  Positive for sensory change and weakness. Negative for dizziness, focal weakness and headaches.  Psychiatric/Behavioral:  The patient is nervous/anxious. The patient does not have insomnia.     As per HPI. Otherwise, a complete review of systems is negative.  PAST MEDICAL HISTORY: Past Medical History:  Diagnosis Date   BPH (benign prostatic hyperplasia)    Colonic diverticular abscess 01/08/2015   Diverticulitis 23/36/12   complicated by abscess and required percutaneous drainage   GERD (gastroesophageal reflux disease)    H/O ETOH abuse    History of chemotherapy last done jan 2017   History of radiation therapy 01/05/13-02/10/13   45 gray to left occipital condyle region   History of radiation therapy 12/01/16-12/10/16   Parasternal nodule, chest- 24 Gy total delivered in 8 fractions, Left sacro-iliac, pelvis- 24 Gy total delivered in 8 fractions    History of radiation therapy 02/19/17-02/22/17   right temporal scalp 30 Gy in 10 fractions   Intra-abdominal abscess ([facility])    Multiple myeloma ([place]) 2015   Neuropathy    Radiation 02/21/14-03/08/14   right posterior chest wall area 30 gray   Radiation 04/19/14-05/02/14   lumbar spine 25 gray   Skull lesion    Left occipital condyle   Wrist fracture, left    x 2    PAST SURGICAL HISTORY: Past Surgical History:  Procedure Laterality Date   COLONOSCOPY N/A 04/19/2015   Procedure: COLONOSCOPY;  Surgeon: Danie Binder, MD;  Location: [facility];  Service: Endoscopy;  Laterality: N/A;  1:30 PM   CYST EXCISION  1959   tail bone   IR IMAGING GUIDED PORT INSERTION  09/25/2017    FAMILY HISTORY: Family History  Problem Relation Age of Onset   Diabetes Mother    Stroke Mother    Hypertension Father     ADVANCED DIRECTIVES (Y/N):  N  HEALTH MAINTENANCE: Social  History   Tobacco Use   Smoking status: Never   Smokeless tobacco: Never  Vaping Use   Vaping Use: Never used  Substance Use Topics   Alcohol use: No    Comment: " Not much no more"   Drug use: No     Colonoscopy:  PAP:  Bone density:  Lipid panel:  Allergies  Allergen Reactions   Codeine Other (See Comments)    Headache   Morphine And Related  Other (See Comments)    Makes him feel weird   Revlimid [Lenalidomide] Other (See Comments)    "Causes me to become weak"    Current Outpatient Medications  Medication Sig Dispense Refill   acyclovir (ZOVIRAX) 400 MG tablet Take 1 tablet (400 mg total) by mouth 2 (two) times daily. 60 tablet 6   ALPRAZolam (XANAX) 1 MG tablet TAKE 1 TABLET BY MOUTH 3 TIMES A DAY AS NEEDED FOR ANXIETY. 60 tablet 5   CALCIUM-VITAMIN D PO Take 1 tablet by mouth 2 (two) times daily.      cephALEXin (KEFLEX) 500 MG capsule Take 1 capsule (500 mg total) by mouth 3 (three) times daily. 21 capsule 0   dexamethasone (DECADRON) 4 MG tablet      fluticasone (FLONASE) 50 MCG/ACT nasal spray Place 2 sprays into both nostrils daily. 16 g 6   furosemide (LASIX) 20 MG tablet TAKE 1 TABLET BY MOUTH ONCE A DAY AS NEEDED 30 tablet 3   gabapentin (NEURONTIN) 400 MG capsule Take 1 capsule (400 mg total) by mouth 3 (three) times daily. 90 capsule 4   Homeopathic Products (THERAWORX RELIEF EX) Apply topically as needed.     ibuprofen (ADVIL) 600 MG tablet Take 1 tablet (600 mg total) by mouth every 8 (eight) hours as needed. 30 tablet 0   lidocaine-prilocaine (EMLA) cream Apply small amount over port one (1) hour prior to appointment. 30 g 0   montelukast (SINGULAIR) 10 MG tablet TAKE 1 TABLET BY MOUTH EVERY NIGHT AT BEDTIME 30 tablet 3   omeprazole (PRILOSEC) 20 MG capsule TAKE 1 CAPSULE BY MOUTH ONCE DAILY 30 capsule 5   oxybutynin (DITROPAN-XL) 10 MG 24 hr tablet Take 1 tablet (10 mg total) by mouth at bedtime. 30 tablet 1   Oxycodone HCl 10 MG TABS TAKE 1 TABLET BY MOUTH IN THE MORNING ATNOON AND AT BEDTIME FOR PAIN 120 tablet 0   senna-docusate (SENOKOT-S) 8.6-50 MG tablet Take 1 tablet by mouth 2 (two) times daily. 60 tablet 5   silver sulfADIAZINE (SILVADENE) 1 % cream Apply 1 application topically daily. 50 g 0   tamsulosin (FLOMAX) 0.4 MG CAPS capsule TAKE ONE CAPSULE BY MOUTH EVERY NIGHT ATBEDTIME 30 capsule 11    traZODone (DESYREL) 100 MG tablet TAKE 3 TABLETS BY MOUTH AT BEDTIME 90 tablet 3   triamcinolone cream (KENALOG) 0.1 % APPLY 1 APPLICATION TOPICALLY TWICE A DAY 30 g 0   TURMERIC PO Take by mouth.     zolendronic acid (ZOMETA) 4 MG/5ML injection Inject 4 mg into the vein every 8 (eight) weeks.     No current facility-administered medications for this visit.   Facility-Administered Medications Ordered in Other Visits  Medication Dose Route Frequency Provider Last Rate Last Admin   daratumumab-hyaluronidase-fihj (DARZALEX FASPRO) 1800-30000 MG-UT/15ML chemo SQ injection 1,800 mg  1,800 mg Subcutaneous Once Lloyd Huger, MD       heparin lock flush 100 UNIT/ML injection            heparin lock  flush 100 unit/mL  500 Units Intravenous Once Lloyd Huger, MD       heparin lock flush 100 unit/mL  500 Units Intracatheter Once PRN Lloyd Huger, MD       heparin lock flush 100 unit/mL  500 Units Intracatheter Once PRN Lloyd Huger, MD       Zoledronic Acid (ZOMETA) IVPB 4 mg  4 mg Intravenous Once Derek Jack, MD 400 mL/hr at 11/13/20 0950 4 mg at 11/13/20 0950    OBJECTIVE: Vitals:   11/13/20 0845  BP: 121/75  Pulse: 76  Resp: 16  Temp: 97.8 F (36.6 C)  SpO2: 99%     Body mass index is 27.98 kg/m.    ECOG FS:1 - Symptomatic but completely ambulatory  General: Well-developed, well-nourished, no acute distress. Eyes: Pink conjunctiva, anicteric sclera. HEENT: Normocephalic, moist mucous membranes. Lungs: No audible wheezing or coughing. Heart: Regular rate and rhythm. Abdomen: Soft, nontender, no obvious distention. Musculoskeletal: No edema, cyanosis, or clubbing. Neuro: Alert, answering all questions appropriately. Cranial nerves grossly intact. Skin: No rashes or petechiae noted. Psych: Normal affect.   LAB RESULTS:  Lab Results  Component Value Date   NA 138 11/13/2020   K 3.8 11/13/2020   CL 103 11/13/2020   CO2 27 11/13/2020   GLUCOSE  91 11/13/2020   BUN 24 (H) 11/13/2020   CREATININE 0.63 11/13/2020   CALCIUM 8.5 (L) 11/13/2020   PROT 5.5 (L) 11/13/2020   ALBUMIN 3.4 (L) 11/13/2020   AST 17 11/13/2020   ALT 13 11/13/2020   ALKPHOS 19 (L) 11/13/2020   BILITOT 0.6 11/13/2020   GFRNONAA >60 11/13/2020   GFRAA >60 10/13/2019    Lab Results  Component Value Date   WBC 4.6 11/13/2020   NEUTROABS 2.9 11/13/2020   HGB 12.7 (L) 11/13/2020   HCT 38.9 (L) 11/13/2020   MCV 95.3 11/13/2020   PLT 108 (L) 11/13/2020     STUDIES: No results found.  ASSESSMENT: Multiple myeloma  PLAN:  Multiple myeloma: Please see note from September 17, 2020 for historic details of his myeloma and treatment.  His most recent M spike was chronic and unchanged at 0.2 with a kappa free light chain mildly elevated at 32.3.  He is currently receiving maintenance Darzalex every 4 weeks and Zometa every 8 weeks on the odd-numbered cycles.  Will continue current regimen at this time.  Return to clinic in 4 weeks for laboratory work and Darzalex only and then in 8 weeks for further evaluation and consideration of cycle 39.  We also discussed the possibility of discontinuing treatments in the future. Thrombocytopenia: Platelets slightly worse today at 108.  Monitor. Falls: Monitor.  Consider discontinuation of treatment as above. Back/joint pain: Chronic and unchanged.  Continue oxycodone as prescribed.  Peripheral neuropathy: Chronic and unchanged.  Continue gabapentin as prescribed. Insomnia/anxiety: Chronic and unchanged.  Continue Xanax and trazodone as needed.   Patient expressed understanding and was in agreement with this plan. He also understands that He can call clinic at any time with any questions, concerns, or complaints.   Cancer Staging No matching staging information was found for the patient.  Lloyd Huger, MD   11/13/2020 9:51 AM

## 2020-11-13 ENCOUNTER — Inpatient Hospital Stay: Payer: Medicare Other | Attending: Oncology

## 2020-11-13 ENCOUNTER — Inpatient Hospital Stay: Payer: Medicare Other

## 2020-11-13 ENCOUNTER — Other Ambulatory Visit: Payer: Self-pay

## 2020-11-13 ENCOUNTER — Inpatient Hospital Stay (HOSPITAL_BASED_OUTPATIENT_CLINIC_OR_DEPARTMENT_OTHER): Payer: Medicare Other | Admitting: Oncology

## 2020-11-13 VITALS — BP 123/87 | HR 62 | Resp 18

## 2020-11-13 VITALS — BP 121/75 | HR 76 | Temp 97.8°F | Resp 16 | Wt 184.0 lb

## 2020-11-13 DIAGNOSIS — C9000 Multiple myeloma not having achieved remission: Secondary | ICD-10-CM | POA: Diagnosis not present

## 2020-11-13 DIAGNOSIS — Z5112 Encounter for antineoplastic immunotherapy: Secondary | ICD-10-CM | POA: Insufficient documentation

## 2020-11-13 DIAGNOSIS — Z79899 Other long term (current) drug therapy: Secondary | ICD-10-CM | POA: Insufficient documentation

## 2020-11-13 LAB — CBC WITH DIFFERENTIAL/PLATELET
Abs Immature Granulocytes: 0.01 10*3/uL (ref 0.00–0.07)
Basophils Absolute: 0 10*3/uL (ref 0.0–0.1)
Basophils Relative: 0 %
Eosinophils Absolute: 0 10*3/uL (ref 0.0–0.5)
Eosinophils Relative: 0 %
HCT: 38.9 % — ABNORMAL LOW (ref 39.0–52.0)
Hemoglobin: 12.7 g/dL — ABNORMAL LOW (ref 13.0–17.0)
Immature Granulocytes: 0 %
Lymphocytes Relative: 28 %
Lymphs Abs: 1.3 10*3/uL (ref 0.7–4.0)
MCH: 31.1 pg (ref 26.0–34.0)
MCHC: 32.6 g/dL (ref 30.0–36.0)
MCV: 95.3 fL (ref 80.0–100.0)
Monocytes Absolute: 0.4 10*3/uL (ref 0.1–1.0)
Monocytes Relative: 9 %
Neutro Abs: 2.9 10*3/uL (ref 1.7–7.7)
Neutrophils Relative %: 63 %
Platelets: 108 10*3/uL — ABNORMAL LOW (ref 150–400)
RBC: 4.08 MIL/uL — ABNORMAL LOW (ref 4.22–5.81)
RDW: 13.8 % (ref 11.5–15.5)
WBC: 4.6 10*3/uL (ref 4.0–10.5)
nRBC: 0 % (ref 0.0–0.2)

## 2020-11-13 LAB — COMPREHENSIVE METABOLIC PANEL
ALT: 13 U/L (ref 0–44)
AST: 17 U/L (ref 15–41)
Albumin: 3.4 g/dL — ABNORMAL LOW (ref 3.5–5.0)
Alkaline Phosphatase: 19 U/L — ABNORMAL LOW (ref 38–126)
Anion gap: 8 (ref 5–15)
BUN: 24 mg/dL — ABNORMAL HIGH (ref 8–23)
CO2: 27 mmol/L (ref 22–32)
Calcium: 8.5 mg/dL — ABNORMAL LOW (ref 8.9–10.3)
Chloride: 103 mmol/L (ref 98–111)
Creatinine, Ser: 0.63 mg/dL (ref 0.61–1.24)
GFR, Estimated: >60 mL/min (ref >60)
Glucose, Bld: 91 mg/dL (ref 70–99)
Potassium: 3.8 mmol/L (ref 3.5–5.1)
Sodium: 138 mmol/L (ref 135–145)
Total Bilirubin: 0.6 mg/dL (ref 0.3–1.2)
Total Protein: 5.5 g/dL — ABNORMAL LOW (ref 6.5–8.1)

## 2020-11-13 LAB — MAGNESIUM: Magnesium: 1.9 mg/dL (ref 1.7–2.4)

## 2020-11-13 MED ORDER — SODIUM CHLORIDE 0.9% FLUSH
10.0000 mL | Freq: Once | INTRAVENOUS | Status: AC
  Administered 2020-11-13: 10 mL via INTRAVENOUS
  Filled 2020-11-13: qty 10

## 2020-11-13 MED ORDER — HEPARIN SOD (PORK) LOCK FLUSH 100 UNIT/ML IV SOLN
INTRAVENOUS | Status: AC
  Administered 2020-11-13: 500 [IU]
  Filled 2020-11-13: qty 5

## 2020-11-13 MED ORDER — ZOLEDRONIC ACID 4 MG/100ML IV SOLN
4.0000 mg | Freq: Once | INTRAVENOUS | Status: AC
  Administered 2020-11-13: 4 mg via INTRAVENOUS
  Filled 2020-11-13: qty 100

## 2020-11-13 MED ORDER — DIPHENHYDRAMINE HCL 25 MG PO CAPS
25.0000 mg | ORAL_CAPSULE | Freq: Once | ORAL | Status: AC
  Administered 2020-11-13: 25 mg via ORAL
  Filled 2020-11-13: qty 1

## 2020-11-13 MED ORDER — HEPARIN SOD (PORK) LOCK FLUSH 100 UNIT/ML IV SOLN
500.0000 [IU] | Freq: Once | INTRAVENOUS | Status: AC | PRN
Start: 2020-11-13 — End: 2020-11-13
  Filled 2020-11-13: qty 5

## 2020-11-13 MED ORDER — DEXAMETHASONE 4 MG PO TABS
20.0000 mg | ORAL_TABLET | Freq: Once | ORAL | Status: AC
  Administered 2020-11-13: 20 mg via ORAL
  Filled 2020-11-13: qty 5

## 2020-11-13 MED ORDER — ACETAMINOPHEN 325 MG PO TABS
650.0000 mg | ORAL_TABLET | Freq: Once | ORAL | Status: AC
  Administered 2020-11-13: 650 mg via ORAL
  Filled 2020-11-13: qty 2

## 2020-11-13 MED ORDER — PROCHLORPERAZINE MALEATE 10 MG PO TABS
10.0000 mg | ORAL_TABLET | Freq: Once | ORAL | Status: AC
  Administered 2020-11-13: 10 mg via ORAL
  Filled 2020-11-13: qty 1

## 2020-11-13 MED ORDER — DARATUMUMAB-HYALURONIDASE-FIHJ 1800-30000 MG-UT/15ML ~~LOC~~ SOLN
1800.0000 mg | Freq: Once | SUBCUTANEOUS | Status: AC
  Administered 2020-11-13: 1800 mg via SUBCUTANEOUS
  Filled 2020-11-13: qty 15

## 2020-11-13 MED ORDER — SODIUM CHLORIDE 0.9 % IV SOLN
Freq: Once | INTRAVENOUS | Status: AC
  Filled 2020-11-13: qty 250

## 2020-11-13 NOTE — Progress Notes (Signed)
Pt reports difficulty sleeping despite trazodone and xanax. Pt reports recent fall with right hip pain that is improving. No other complaints at this time.

## 2020-11-13 NOTE — Patient Instructions (Signed)
[facility]   Discharge Instructions: Thank you for choosing [facility] to provide your oncology and hematology care.  If you have a lab appointment with the [facility], please go directly to the [facility] and check in at the registration area.  Wear comfortable clothing and clothing appropriate for easy access to any Portacath or PICC line.   We strive to give you quality time with your provider. You may need to reschedule your appointment if you arrive late (15 or more minutes).  Arriving late affects you and other patients whose appointments are after yours.  Also, if you miss three or more appointments without notifying the office, you may be dismissed from the clinic at the provider's discretion.      For prescription refill requests, have your pharmacy contact our office and allow 72 hours for refills to be completed.    Today you received the following chemotherapy and/or immunotherapy agents: Darzalex Faspro. Today you also received the following: Zometa.      To help prevent nausea and vomiting after your treatment, we encourage you to take your nausea medication as directed.  BELOW ARE SYMPTOMS THAT SHOULD BE REPORTED IMMEDIATELY: *FEVER GREATER THAN 100.4 F (38 C) OR HIGHER *CHILLS OR SWEATING *NAUSEA AND VOMITING THAT IS NOT CONTROLLED WITH YOUR NAUSEA MEDICATION *UNUSUAL SHORTNESS OF BREATH *UNUSUAL BRUISING OR BLEEDING *URINARY PROBLEMS (pain or burning when urinating, or frequent urination) *BOWEL PROBLEMS (unusual diarrhea, constipation, pain near the anus) TENDERNESS IN MOUTH AND THROAT WITH OR WITHOUT PRESENCE OF ULCERS (sore throat, sores in mouth, or a toothache) UNUSUAL RASH, SWELLING OR PAIN  UNUSUAL VAGINAL DISCHARGE OR ITCHING   Items with * indicate a potential emergency and should be followed up as soon as possible or go to the Emergency Department if any problems should occur.  Please show the  CHEMOTHERAPY ALERT CARD or IMMUNOTHERAPY ALERT CARD at check-in to the Emergency Department and triage nurse.  Should you have questions after your visit or need to cancel or reschedule your appointment, please contact [facility]  [phone] and follow the prompts.  Office hours are 8:00 a.m. to 4:30 p.m. Monday - Friday. Please note that voicemails left after 4:00 p.m. may not be returned until the following business day.  We are closed weekends and major holidays. You have access to a nurse at all times for urgent questions. Please call the main number to the clinic [phone] and follow the prompts.  For any non-urgent questions, you may also contact your provider using MyChart. We now offer e-Visits for anyone 83 and older to request care online for non-urgent symptoms. For details visit [email or web address].   Also download the MyChart app! Go to the app store, search "MyChart", open the app, select [facility], and log in with your MyChart username and password.  Due to Covid, a mask is required upon entering the hospital/clinic. If you do not have a mask, one will be given to you upon arrival. For doctor visits, patients may have 1 support person aged 83 or older with them. For treatment visits, patients cannot have anyone with them due to current Covid guidelines and our immunocompromised population.

## 2020-11-13 NOTE — Progress Notes (Signed)
Per MD, [name], verbal order: patient should receive both Darzalex Faspro treatment and Zometa today.

## 2020-11-13 NOTE — Progress Notes (Signed)
Nutrition Assessment:  Patient identified on Malnutrition Screening report for weight loss and poor po intake.  85 year old male with multiple myeloma.  Past medical history GERD, etoh use in the past, diverticulosis.  Patient receiving darzalex, zometa.    Met with patient during infusion.  Reports that he takes care of his longtime significant other who has dementia.  He has daughters that check on him and bring him food.  Son is involved with drugs and he is tearful when talking about him today.  Thinks that his worry about his son has decreased his appetite.  Says that he usually eats breakfast. This am had 2 eggs bacon/sausage or ham and gravy.  Lunch is usually ham and cheese sandwich. Dinner daughter usually brings to him, recently ate pizza and beef stew with vegetables.  Says that he does not eat much at one time.  Drinks carnation breakfast essentials.  Mixes 2 packets in milk and drinks it about 2 a day.    Medications: lasix, prilosec  Labs: reviewed  Anthropometrics:   Height: 68 inches Weight: 184 lb  172 lb 3.2 oz on 9/28 178 lb on 9/12 179 lb on 8/30  Fluid noted bilateral ankles BMI: 27   NUTRITION DIAGNOSIS: Inadequate oral intake related to cancer, family stress as evidenced by decreased appetite, likely weight loss but now with fluid accumulation   INTERVENTION:  Discussed ways to add calories and protein in diet.  Handout provided Encouraged carnation breakfast essentials shakes and can add ice cream and make milkshake for added calories.  Contact information given    MONITORING, EVALUATION, GOAL: weight trends, intake   NEXT VISIT: Nov 8 during infusion (Tuesday)  Jaymeson Mengel B. Zenia Resides, [place], [place] Registered Dietitian [phone] (mobile)

## 2020-11-14 LAB — IGG, IGA, IGM
IgA: 23 mg/dL — ABNORMAL LOW (ref 61–437)
IgG (Immunoglobin G), Serum: 370 mg/dL — ABNORMAL LOW (ref 603–1613)
IgM (Immunoglobulin M), Srm: 14 mg/dL — ABNORMAL LOW (ref 15–143)

## 2020-11-14 LAB — KAPPA/LAMBDA LIGHT CHAINS
Kappa free light chain: 27.5 mg/L — ABNORMAL HIGH (ref 3.3–19.4)
Kappa, lambda light chain ratio: 7.64 — ABNORMAL HIGH (ref 0.26–1.65)
Lambda free light chains: 3.6 mg/L — ABNORMAL LOW (ref 5.7–26.3)

## 2020-11-15 LAB — PROTEIN ELECTROPHORESIS, SERUM
A/G Ratio: 1.7 (ref 0.7–1.7)
Albumin ELP: 3.1 g/dL (ref 2.9–4.4)
Alpha-1-Globulin: 0.2 g/dL (ref 0.0–0.4)
Alpha-2-Globulin: 0.6 g/dL (ref 0.4–1.0)
Beta Globulin: 0.7 g/dL (ref 0.7–1.3)
Gamma Globulin: 0.3 g/dL — ABNORMAL LOW (ref 0.4–1.8)
Globulin, Total: 1.8 g/dL — ABNORMAL LOW (ref 2.2–3.9)
M-Spike, %: 0.2 g/dL — ABNORMAL HIGH
Total Protein ELP: 4.9 g/dL — ABNORMAL LOW (ref 6.0–8.5)

## 2020-11-27 ENCOUNTER — Other Ambulatory Visit (HOSPITAL_COMMUNITY): Payer: Self-pay | Admitting: Hematology

## 2020-12-10 ENCOUNTER — Other Ambulatory Visit: Payer: Self-pay | Admitting: Oncology

## 2020-12-10 DIAGNOSIS — C9000 Multiple myeloma not having achieved remission: Secondary | ICD-10-CM

## 2020-12-11 ENCOUNTER — Ambulatory Visit: Payer: Medicare Other | Admitting: Oncology

## 2020-12-11 ENCOUNTER — Encounter (HOSPITAL_COMMUNITY): Payer: Self-pay | Admitting: Hematology

## 2020-12-11 ENCOUNTER — Inpatient Hospital Stay: Payer: Medicare Other

## 2020-12-18 ENCOUNTER — Inpatient Hospital Stay: Payer: Medicare Other

## 2020-12-18 ENCOUNTER — Other Ambulatory Visit: Payer: Self-pay

## 2020-12-18 ENCOUNTER — Inpatient Hospital Stay: Payer: Medicare Other | Attending: Hematology and Oncology

## 2020-12-18 VITALS — BP 116/43 | HR 52 | Temp 97.2°F | Resp 16 | Wt 182.8 lb

## 2020-12-18 DIAGNOSIS — C9000 Multiple myeloma not having achieved remission: Secondary | ICD-10-CM | POA: Diagnosis not present

## 2020-12-18 DIAGNOSIS — Z5112 Encounter for antineoplastic immunotherapy: Secondary | ICD-10-CM | POA: Insufficient documentation

## 2020-12-18 DIAGNOSIS — Z79899 Other long term (current) drug therapy: Secondary | ICD-10-CM | POA: Diagnosis not present

## 2020-12-18 LAB — CBC WITH DIFFERENTIAL/PLATELET
Abs Immature Granulocytes: 0.01 10*3/uL (ref 0.00–0.07)
Basophils Absolute: 0 10*3/uL (ref 0.0–0.1)
Basophils Relative: 0 %
Eosinophils Absolute: 0 10*3/uL (ref 0.0–0.5)
Eosinophils Relative: 1 %
HCT: 39.1 % (ref 39.0–52.0)
Hemoglobin: 12.7 g/dL — ABNORMAL LOW (ref 13.0–17.0)
Immature Granulocytes: 0 %
Lymphocytes Relative: 36 %
Lymphs Abs: 1.7 10*3/uL (ref 0.7–4.0)
MCH: 30.8 pg (ref 26.0–34.0)
MCHC: 32.5 g/dL (ref 30.0–36.0)
MCV: 94.7 fL (ref 80.0–100.0)
Monocytes Absolute: 0.5 10*3/uL (ref 0.1–1.0)
Monocytes Relative: 9 %
Neutro Abs: 2.6 10*3/uL (ref 1.7–7.7)
Neutrophils Relative %: 54 %
Platelets: 102 10*3/uL — ABNORMAL LOW (ref 150–400)
RBC: 4.13 MIL/uL — ABNORMAL LOW (ref 4.22–5.81)
RDW: 13.6 % (ref 11.5–15.5)
WBC: 4.9 10*3/uL (ref 4.0–10.5)
nRBC: 0 % (ref 0.0–0.2)

## 2020-12-18 LAB — COMPREHENSIVE METABOLIC PANEL
ALT: 9 U/L (ref 0–44)
AST: 14 U/L — ABNORMAL LOW (ref 15–41)
Albumin: 3.4 g/dL — ABNORMAL LOW (ref 3.5–5.0)
Alkaline Phosphatase: 22 U/L — ABNORMAL LOW (ref 38–126)
Anion gap: 7 (ref 5–15)
BUN: 22 mg/dL (ref 8–23)
CO2: 30 mmol/L (ref 22–32)
Calcium: 8.3 mg/dL — ABNORMAL LOW (ref 8.9–10.3)
Chloride: 102 mmol/L (ref 98–111)
Creatinine, Ser: 0.78 mg/dL (ref 0.61–1.24)
GFR, Estimated: >60 mL/min (ref >60)
Glucose, Bld: 89 mg/dL (ref 70–99)
Potassium: 4.1 mmol/L (ref 3.5–5.1)
Sodium: 139 mmol/L (ref 135–145)
Total Bilirubin: 0.4 mg/dL (ref 0.3–1.2)
Total Protein: 5.6 g/dL — ABNORMAL LOW (ref 6.5–8.1)

## 2020-12-18 LAB — MAGNESIUM: Magnesium: 2.2 mg/dL (ref 1.7–2.4)

## 2020-12-18 MED ORDER — HEPARIN SOD (PORK) LOCK FLUSH 100 UNIT/ML IV SOLN
INTRAVENOUS | Status: AC
  Administered 2020-12-18: 500 [IU]
  Filled 2020-12-18: qty 5

## 2020-12-18 MED ORDER — DEXAMETHASONE 4 MG PO TABS
20.0000 mg | ORAL_TABLET | Freq: Once | ORAL | Status: AC
  Administered 2020-12-18: 20 mg via ORAL
  Filled 2020-12-18: qty 5

## 2020-12-18 MED ORDER — ACETAMINOPHEN 325 MG PO TABS
650.0000 mg | ORAL_TABLET | Freq: Once | ORAL | Status: AC
  Administered 2020-12-18: 650 mg via ORAL
  Filled 2020-12-18: qty 2

## 2020-12-18 MED ORDER — DARATUMUMAB-HYALURONIDASE-FIHJ 1800-30000 MG-UT/15ML ~~LOC~~ SOLN
1800.0000 mg | Freq: Once | SUBCUTANEOUS | Status: AC
  Administered 2020-12-18: 1800 mg via SUBCUTANEOUS
  Filled 2020-12-18: qty 15

## 2020-12-18 MED ORDER — PROCHLORPERAZINE MALEATE 10 MG PO TABS
10.0000 mg | ORAL_TABLET | Freq: Once | ORAL | Status: AC
  Administered 2020-12-18: 10 mg via ORAL
  Filled 2020-12-18: qty 1

## 2020-12-18 MED ORDER — DIPHENHYDRAMINE HCL 25 MG PO CAPS
25.0000 mg | ORAL_CAPSULE | Freq: Once | ORAL | Status: AC
  Administered 2020-12-18: 25 mg via ORAL
  Filled 2020-12-18: qty 1

## 2020-12-18 NOTE — Patient Instructions (Signed)
[facility]  Discharge Instructions: Thank you for choosing [facility] to provide your oncology and hematology care.  If you have a lab appointment with the [facility], please go directly to the [facility] and check in at the registration area.  Wear comfortable clothing and clothing appropriate for easy access to any Portacath or PICC line.   We strive to give you quality time with your provider. You may need to reschedule your appointment if you arrive late (15 or more minutes).  Arriving late affects you and other patients whose appointments are after yours.  Also, if you miss three or more appointments without notifying the office, you may be dismissed from the clinic at the provider's discretion.      For prescription refill requests, have your pharmacy contact our office and allow 72 hours for refills to be completed.    Today you received the following chemotherapy and/or immunotherapy agents Darzalex      To help prevent nausea and vomiting after your treatment, we encourage you to take your nausea medication as directed.  BELOW ARE SYMPTOMS THAT SHOULD BE REPORTED IMMEDIATELY: *FEVER GREATER THAN 100.4 F (38 C) OR HIGHER *CHILLS OR SWEATING *NAUSEA AND VOMITING THAT IS NOT CONTROLLED WITH YOUR NAUSEA MEDICATION *UNUSUAL SHORTNESS OF BREATH *UNUSUAL BRUISING OR BLEEDING *URINARY PROBLEMS (pain or burning when urinating, or frequent urination) *BOWEL PROBLEMS (unusual diarrhea, constipation, pain near the anus) TENDERNESS IN MOUTH AND THROAT WITH OR WITHOUT PRESENCE OF ULCERS (sore throat, sores in mouth, or a toothache) UNUSUAL RASH, SWELLING OR PAIN  UNUSUAL VAGINAL DISCHARGE OR ITCHING   Items with * indicate a potential emergency and should be followed up as soon as possible or go to the Emergency Department if any problems should occur.  Please show the CHEMOTHERAPY ALERT CARD or IMMUNOTHERAPY ALERT CARD at check-in to  the Emergency Department and triage nurse.  Should you have questions after your visit or need to cancel or reschedule your appointment, please contact [facility]  [phone] and follow the prompts.  Office hours are 8:00 a.m. to 4:30 p.m. Monday - Friday. Please note that voicemails left after 4:00 p.m. may not be returned until the following business day.  We are closed weekends and major holidays. You have access to a nurse at all times for urgent questions. Please call the main number to the clinic [phone] and follow the prompts.  For any non-urgent questions, you may also contact your provider using MyChart. We now offer e-Visits for anyone 34 and older to request care online for non-urgent symptoms. For details visit [email or web address].   Also download the MyChart app! Go to the app store, search "MyChart", open the app, select [facility], and log in with your MyChart username and password.  Due to Covid, a mask is required upon entering the hospital/clinic. If you do not have a mask, one will be given to you upon arrival. For doctor visits, patients may have 1 support person aged 23 or older with them. For treatment visits, patients cannot have anyone with them due to current Covid guidelines and our immunocompromised population.

## 2020-12-18 NOTE — Progress Notes (Signed)
Nutrition Follow-up:  Patient with multiple myeloma.  Patient receiving darzalex, zometa.    Met with patient during infusion.  Patient reports that his appetite is about the same.  "My daughter encourages me to eat all the time and she is an excellent cook."  I eat the best I can."  Has been eating potato salad, beef stew, hamburger helper, steak patty on bread, eggs, milk and carnation breakfast essentials.  Mixes 2 packets in milk.    Reports recent falls, missed appointment last week due to fall.    Medications: reviewed  Labs: reveiwed  Anthropometrics:   Weight 182 lb 12.8 oz 184 lb on 10/11 172 lb 3.2 oz on 9/28 178 lb on 9/12 179 lb on 8/30   NUTRITION DIAGNOSIS: Inadequate oral intake continues   INTERVENTION:  Encouraged patient to continue carnation breakfast essentials BID for added nutrition     MONITORING, EVALUATION, GOAL: weight trends, intake   NEXT VISIT: Tuesday, Dec 13 during infusion  Brooklyne Radke B. Zenia Resides, [place], [place] Registered Dietitian [phone] (mobile)

## 2020-12-18 NOTE — Progress Notes (Signed)
[name] made aware of patient's HR 49, PLT 102, and Calcium 8.3. Per [name], proceed with treatment.

## 2020-12-18 NOTE — Progress Notes (Signed)
1505: Pt monitored 15-20 minutes post Darzalex injection per [name]. Pt tolerated treatment well. Injection site WNL. Pt and VS stable at discharge.

## 2020-12-19 LAB — IGG, IGA, IGM
IgA: 26 mg/dL — ABNORMAL LOW (ref 61–437)
IgG (Immunoglobin G), Serum: 485 mg/dL — ABNORMAL LOW (ref 603–1613)
IgM (Immunoglobulin M), Srm: 14 mg/dL — ABNORMAL LOW (ref 15–143)

## 2020-12-19 LAB — KAPPA/LAMBDA LIGHT CHAINS
Kappa free light chain: 44.9 mg/L — ABNORMAL HIGH (ref 3.3–19.4)
Kappa, lambda light chain ratio: 6.8 — ABNORMAL HIGH (ref 0.26–1.65)
Lambda free light chains: 6.6 mg/L (ref 5.7–26.3)

## 2020-12-21 LAB — PROTEIN ELECTROPHORESIS, SERUM
A/G Ratio: 1.7 (ref 0.7–1.7)
Albumin ELP: 3 g/dL (ref 2.9–4.4)
Alpha-1-Globulin: 0.2 g/dL (ref 0.0–0.4)
Alpha-2-Globulin: 0.5 g/dL (ref 0.4–1.0)
Beta Globulin: 0.6 g/dL — ABNORMAL LOW (ref 0.7–1.3)
Gamma Globulin: 0.5 g/dL (ref 0.4–1.8)
Globulin, Total: 1.8 g/dL — ABNORMAL LOW (ref 2.2–3.9)
M-Spike, %: 0.3 g/dL — ABNORMAL HIGH
Total Protein ELP: 4.8 g/dL — ABNORMAL LOW (ref 6.0–8.5)

## 2021-01-05 ENCOUNTER — Other Ambulatory Visit (HOSPITAL_COMMUNITY): Payer: Self-pay | Admitting: Hematology

## 2021-01-05 ENCOUNTER — Other Ambulatory Visit: Payer: Self-pay | Admitting: Family Medicine

## 2021-01-05 ENCOUNTER — Other Ambulatory Visit: Payer: Self-pay | Admitting: Oncology

## 2021-01-05 DIAGNOSIS — N400 Enlarged prostate without lower urinary tract symptoms: Secondary | ICD-10-CM

## 2021-01-05 DIAGNOSIS — G4700 Insomnia, unspecified: Secondary | ICD-10-CM

## 2021-01-05 DIAGNOSIS — C9000 Multiple myeloma not having achieved remission: Secondary | ICD-10-CM

## 2021-01-07 ENCOUNTER — Encounter (HOSPITAL_COMMUNITY): Payer: Self-pay | Admitting: Hematology

## 2021-01-08 ENCOUNTER — Other Ambulatory Visit: Payer: Medicare Other

## 2021-01-08 ENCOUNTER — Ambulatory Visit: Payer: Medicare Other | Admitting: Oncology

## 2021-01-08 ENCOUNTER — Ambulatory Visit: Payer: Medicare Other

## 2021-01-10 ENCOUNTER — Other Ambulatory Visit: Payer: Self-pay | Admitting: *Deleted

## 2021-01-10 DIAGNOSIS — C9000 Multiple myeloma not having achieved remission: Secondary | ICD-10-CM

## 2021-01-14 NOTE — Progress Notes (Signed)
[facility]  [phone] [phone]  ID: Dean [name]. OB: 1935/04/27  MR#: [id number]  CBJ#:[id number]  Patient Care Team: [name], MD as PCP - General (Family Medicine) Gery Pray, MD as Consulting Physician (Radiation Oncology) Danie Binder, MD (Inactive) as Consulting Physician (Gastroenterology) Michael Boston, MD as Consulting Physician (General Surgery)  CHIEF COMPLAINT: Multiple myeloma  INTERVAL HISTORY: Patient returns to clinic today for repeat laboratory work, further evaluation, and continuation of treatment with Darzalex and Zometa.  He continues to have right hip pain secondary to his fall several months ago, but otherwise feels well.  He has anxiety secondary to being the primary caretaker of his wife who has dementia.  He has no neurologic complaints.  He denies any recent fevers or illnesses.  He has a good appetite and denies weight loss.  He has no chest pain, shortness of breath, cough, or hemoptysis.  He denies any nausea, vomiting, constipation, or diarrhea.  He has no urinary complaints.  Patient offers no further specific complaints today.  REVIEW OF SYSTEMS:   Review of Systems  Constitutional:  Positive for malaise/fatigue. Negative for fever and weight loss.  Respiratory: Negative.  Negative for cough, hemoptysis and shortness of breath.   Cardiovascular: Negative.  Negative for chest pain and leg swelling.  Gastrointestinal: Negative.  Negative for abdominal pain and nausea.  Genitourinary: Negative.  Negative for dysuria.  Musculoskeletal:  Positive for joint pain. Negative for back pain and falls.  Skin: Negative.  Negative for rash.  Neurological:  Positive for sensory change and weakness. Negative for dizziness, focal weakness and headaches.  Psychiatric/Behavioral:  The patient is nervous/anxious. The patient does not have insomnia.    As per HPI. Otherwise, a complete review of systems is  negative.  PAST MEDICAL HISTORY: Past Medical History:  Diagnosis Date   BPH (benign prostatic hyperplasia)    Colonic diverticular abscess 01/08/2015   Diverticulitis 16/07/37   complicated by abscess and required percutaneous drainage   GERD (gastroesophageal reflux disease)    H/O ETOH abuse    History of chemotherapy last done jan 2017   History of radiation therapy 01/05/13-02/10/13   45 gray to left occipital condyle region   History of radiation therapy 12/01/16-12/10/16   Parasternal nodule, chest- 24 Gy total delivered in 8 fractions, Left sacro-iliac, pelvis- 24 Gy total delivered in 8 fractions    History of radiation therapy 02/19/17-02/22/17   right temporal scalp 30 Gy in 10 fractions   Intra-abdominal abscess (HCC)    Multiple myeloma ([facility]) 2015   Neuropathy    Radiation 02/21/14-03/08/14   right posterior chest wall area 30 gray   Radiation 04/19/14-05/02/14   lumbar spine 25 gray   Skull lesion    Left occipital condyle   Wrist fracture, left    x 2    PAST SURGICAL HISTORY: Past Surgical History:  Procedure Laterality Date   COLONOSCOPY N/A 04/19/2015   Procedure: COLONOSCOPY;  Surgeon: Danie Binder, MD;  Location: [facility];  Service: Endoscopy;  Laterality: N/A;  1:30 PM   CYST EXCISION  1959   tail bone   IR IMAGING GUIDED PORT INSERTION  09/25/2017    FAMILY HISTORY: Family History  Problem Relation Age of Onset   Diabetes Mother    Stroke Mother    Hypertension Father     ADVANCED DIRECTIVES (Y/N):  N  HEALTH MAINTENANCE: Social History   Tobacco Use   Smoking status: Never  Smokeless tobacco: Never  Vaping Use   Vaping Use: Never used  Substance Use Topics   Alcohol use: No    Comment: " Not much no more"   Drug use: No     Colonoscopy:  PAP:  Bone density:  Lipid panel:  Allergies  Allergen Reactions   Codeine Other (See Comments)    Headache   Morphine And Related Other (See Comments)    Makes him feel weird   Revlimid  [Lenalidomide] Other (See Comments)    "Causes me to become weak"    Current Outpatient Medications  Medication Sig Dispense Refill   acyclovir (ZOVIRAX) 400 MG tablet TAKE 1 TABLET BY MOUTH TWICE A DAY 60 tablet 6   ALPRAZolam (XANAX) 1 MG tablet TAKE 1 TABLET BY MOUTH 3 TIMES A DAY AS NEEDED FOR ANXIETY. 60 tablet 3   CALCIUM-VITAMIN D PO Take 1 tablet by mouth 2 (two) times daily.      cephALEXin (KEFLEX) 500 MG capsule Take 1 capsule (500 mg total) by mouth 3 (three) times daily. 21 capsule 0   dexamethasone (DECADRON) 4 MG tablet      fluticasone (FLONASE) 50 MCG/ACT nasal spray Place 2 sprays into both nostrils daily. 16 g 6   furosemide (LASIX) 20 MG tablet TAKE 1 TABLET BY MOUTH ONCE A DAY AS NEEDED 30 tablet 3   gabapentin (NEURONTIN) 400 MG capsule Take 1 capsule (400 mg total) by mouth 3 (three) times daily. 90 capsule 4   Homeopathic Products (THERAWORX RELIEF EX) Apply topically as needed.     ibuprofen (ADVIL) 600 MG tablet Take 1 tablet (600 mg total) by mouth every 8 (eight) hours as needed. 30 tablet 0   lidocaine-prilocaine (EMLA) cream Apply small amount over port one (1) hour prior to appointment. 30 g 0   montelukast (SINGULAIR) 10 MG tablet TAKE 1 TABLET BY MOUTH EVERY NIGHT AT BEDTIME 30 tablet 3   omeprazole (PRILOSEC) 20 MG capsule TAKE 1 CAPSULE BY MOUTH ONCE DAILY 30 capsule 5   oxybutynin (DITROPAN-XL) 10 MG 24 hr tablet Take 1 tablet (10 mg total) by mouth at bedtime. 30 tablet 1   Oxycodone HCl 10 MG TABS TAKE 1 TABLET BY MOUTH IN THE MORNING ATNOON AND AT BEDTIME FOR PAIN 120 tablet 0   senna-docusate (SENOKOT-S) 8.6-50 MG tablet Take 1 tablet by mouth 2 (two) times daily. 60 tablet 5   silver sulfADIAZINE (SILVADENE) 1 % cream Apply 1 application topically daily. 50 g 0   tamsulosin (FLOMAX) 0.4 MG CAPS capsule TAKE ONE CAPSULE BY MOUTH EVERY NIGHT ATBEDTIME 30 capsule 11   traZODone (DESYREL) 100 MG tablet TAKE 3 TABLETS BY MOUTH AT BEDTIME 90 tablet 1    triamcinolone cream (KENALOG) 0.1 % APPLY 1 APPLICATION TOPICALLY TWICE A DAY 30 g 0   TURMERIC PO Take by mouth.     zolendronic acid (ZOMETA) 4 MG/5ML injection Inject 4 mg into the vein every 8 (eight) weeks.     No current facility-administered medications for this visit.   Facility-Administered Medications Ordered in Other Visits  Medication Dose Route Frequency Provider Last Rate Last Admin   heparin lock flush 100 unit/mL  500 Units Intravenous Once Lloyd Huger, MD       heparin lock flush 100 unit/mL  500 Units Intracatheter Once PRN Lloyd Huger, MD        OBJECTIVE: Vitals:   01/15/21 0902  BP: 118/62  Pulse: 61  Resp: 16  Temp: Marland Kitchen)  97.5 F (36.4 C)  SpO2: 100%     Body mass index is 26.64 kg/m.    ECOG FS:1 - Symptomatic but completely ambulatory  General: Well-developed, well-nourished, no acute distress.  Sitting in a wheelchair. Eyes: Pink conjunctiva, anicteric sclera. HEENT: Normocephalic, moist mucous membranes. Lungs: No audible wheezing or coughing. Heart: Regular rate and rhythm. Abdomen: Soft, nontender, no obvious distention. Musculoskeletal: No edema, cyanosis, or clubbing. Neuro: Alert, answering all questions appropriately. Cranial nerves grossly intact. Skin: No rashes or petechiae noted. Psych: Normal affect.   LAB RESULTS:  Lab Results  Component Value Date   NA 138 01/15/2021   K 3.9 01/15/2021   CL 102 01/15/2021   CO2 29 01/15/2021   GLUCOSE 86 01/15/2021   BUN 23 01/15/2021   CREATININE 0.66 01/15/2021   CALCIUM 8.3 (L) 01/15/2021   PROT 5.3 (L) 01/15/2021   ALBUMIN 3.3 (L) 01/15/2021   AST 15 01/15/2021   ALT 8 01/15/2021   ALKPHOS 20 (L) 01/15/2021   BILITOT 0.5 01/15/2021   GFRNONAA >60 01/15/2021   GFRAA >60 10/13/2019    Lab Results  Component Value Date   WBC 4.4 01/15/2021   NEUTROABS 2.2 01/15/2021   HGB 12.6 (L) 01/15/2021   HCT 37.4 (L) 01/15/2021   MCV 94.2 01/15/2021   PLT 109 (L) 01/15/2021      STUDIES: No results found.  ASSESSMENT: Multiple myeloma  PLAN:  Multiple myeloma: Please see note from September 17, 2020 for historic details of his myeloma and treatment.  His most recent M spike was chronic and unchanged at 0.2 with a kappa free light chain mildly elevated at 32.3.  He is currently receiving maintenance Darzalex every 4 weeks and Zometa every 8 weeks on the odd-numbered cycles.  Will continue current regimen at this time, but will consider discontinuing in the near future.  Proceed with treatment today.  Return to clinic in 4 weeks for laboratory work and Darzalex only.  Patient will then return to clinic in 8 weeks for consideration of cycle 41.   Thrombocytopenia: Chronic and unchanged.  Patient platelet count is 109 today.  Back/joint pain: Chronic and unchanged.  Continue oxycodone as prescribed.  Will send patient for x-ray to ensure no bony abnormality. Peripheral neuropathy: Chronic and unchanged.  Continue gabapentin as prescribed. Insomnia/anxiety: Chronic and unchanged.  Continue Xanax and trazodone as needed.   Patient expressed understanding and was in agreement with this plan. He also understands that He can call clinic at any time with any questions, concerns, or complaints.    Cancer Staging  No matching staging information was found for the patient.  Lloyd Huger, MD   01/15/2021 12:37 PM

## 2021-01-15 ENCOUNTER — Inpatient Hospital Stay: Payer: Medicare Other

## 2021-01-15 ENCOUNTER — Ambulatory Visit
Admission: RE | Admit: 2021-01-15 | Discharge: 2021-01-15 | Disposition: A | Discharge status: Home / Self Care | Payer: Medicare Other | Source: Ambulatory Visit | Attending: Oncology | Admitting: Oncology

## 2021-01-15 ENCOUNTER — Other Ambulatory Visit: Payer: Self-pay

## 2021-01-15 ENCOUNTER — Inpatient Hospital Stay: Payer: Medicare Other | Attending: Oncology | Admitting: Oncology

## 2021-01-15 ENCOUNTER — Ambulatory Visit
Admission: RE | Admit: 2021-01-15 | Discharge: 2021-01-15 | Disposition: A | Discharge status: Home / Self Care | Payer: Medicare Other | Attending: Oncology | Admitting: Oncology

## 2021-01-15 VITALS — BP 118/62 | HR 61 | Temp 97.5°F | Resp 16 | Wt 175.2 lb

## 2021-01-15 DIAGNOSIS — M25551 Pain in right hip: Secondary | ICD-10-CM | POA: Diagnosis not present

## 2021-01-15 DIAGNOSIS — Z5112 Encounter for antineoplastic immunotherapy: Secondary | ICD-10-CM | POA: Insufficient documentation

## 2021-01-15 DIAGNOSIS — C9000 Multiple myeloma not having achieved remission: Secondary | ICD-10-CM | POA: Insufficient documentation

## 2021-01-15 DIAGNOSIS — Z79899 Other long term (current) drug therapy: Secondary | ICD-10-CM | POA: Insufficient documentation

## 2021-01-15 LAB — CBC WITH DIFFERENTIAL/PLATELET
Abs Immature Granulocytes: 0 10*3/uL (ref 0.00–0.07)
Basophils Absolute: 0 10*3/uL (ref 0.0–0.1)
Basophils Relative: 0 %
Eosinophils Absolute: 0 10*3/uL (ref 0.0–0.5)
Eosinophils Relative: 1 %
HCT: 37.4 % — ABNORMAL LOW (ref 39.0–52.0)
Hemoglobin: 12.6 g/dL — ABNORMAL LOW (ref 13.0–17.0)
Immature Granulocytes: 0 %
Lymphocytes Relative: 37 %
Lymphs Abs: 1.6 10*3/uL (ref 0.7–4.0)
MCH: 31.7 pg (ref 26.0–34.0)
MCHC: 33.7 g/dL (ref 30.0–36.0)
MCV: 94.2 fL (ref 80.0–100.0)
Monocytes Absolute: 0.5 10*3/uL (ref 0.1–1.0)
Monocytes Relative: 12 %
Neutro Abs: 2.2 10*3/uL (ref 1.7–7.7)
Neutrophils Relative %: 50 %
Platelets: 109 10*3/uL — ABNORMAL LOW (ref 150–400)
RBC: 3.97 MIL/uL — ABNORMAL LOW (ref 4.22–5.81)
RDW: 13.5 % (ref 11.5–15.5)
WBC: 4.4 10*3/uL (ref 4.0–10.5)
nRBC: 0 % (ref 0.0–0.2)

## 2021-01-15 LAB — COMPREHENSIVE METABOLIC PANEL
ALT: 8 U/L (ref 0–44)
AST: 15 U/L (ref 15–41)
Albumin: 3.3 g/dL — ABNORMAL LOW (ref 3.5–5.0)
Alkaline Phosphatase: 20 U/L — ABNORMAL LOW (ref 38–126)
Anion gap: 7 (ref 5–15)
BUN: 23 mg/dL (ref 8–23)
CO2: 29 mmol/L (ref 22–32)
Calcium: 8.3 mg/dL — ABNORMAL LOW (ref 8.9–10.3)
Chloride: 102 mmol/L (ref 98–111)
Creatinine, Ser: 0.66 mg/dL (ref 0.61–1.24)
GFR, Estimated: >60 mL/min (ref >60)
Glucose, Bld: 86 mg/dL (ref 70–99)
Potassium: 3.9 mmol/L (ref 3.5–5.1)
Sodium: 138 mmol/L (ref 135–145)
Total Bilirubin: 0.5 mg/dL (ref 0.3–1.2)
Total Protein: 5.3 g/dL — ABNORMAL LOW (ref 6.5–8.1)

## 2021-01-15 LAB — MAGNESIUM: Magnesium: 1.9 mg/dL (ref 1.7–2.4)

## 2021-01-15 MED ORDER — ACETAMINOPHEN 325 MG PO TABS
650.0000 mg | ORAL_TABLET | Freq: Once | ORAL | Status: AC
  Administered 2021-01-15: 650 mg via ORAL
  Filled 2021-01-15: qty 2

## 2021-01-15 MED ORDER — PROCHLORPERAZINE MALEATE 10 MG PO TABS
10.0000 mg | ORAL_TABLET | Freq: Once | ORAL | Status: AC
  Administered 2021-01-15: 10 mg via ORAL
  Filled 2021-01-15: qty 1

## 2021-01-15 MED ORDER — HEPARIN SOD (PORK) LOCK FLUSH 100 UNIT/ML IV SOLN
500.0000 [IU] | Freq: Once | INTRAVENOUS | Status: AC
  Administered 2021-01-15: 500 [IU] via INTRAVENOUS
  Filled 2021-01-15: qty 5

## 2021-01-15 MED ORDER — DEXAMETHASONE 4 MG PO TABS
20.0000 mg | ORAL_TABLET | Freq: Once | ORAL | Status: AC
  Administered 2021-01-15: 20 mg via ORAL
  Filled 2021-01-15: qty 5

## 2021-01-15 MED ORDER — SODIUM CHLORIDE 0.9 % IV SOLN
Freq: Once | INTRAVENOUS | Status: AC
  Filled 2021-01-15: qty 250

## 2021-01-15 MED ORDER — SODIUM CHLORIDE 0.9% FLUSH
10.0000 mL | INTRAVENOUS | Status: DC | PRN
  Administered 2021-01-15: 10 mL via INTRAVENOUS
  Filled 2021-01-15: qty 10

## 2021-01-15 MED ORDER — SODIUM CHLORIDE 0.9 % IV SOLN
Freq: Once | INTRAVENOUS | Status: DC
  Filled 2021-01-15: qty 250

## 2021-01-15 MED ORDER — DIPHENHYDRAMINE HCL 25 MG PO CAPS
25.0000 mg | ORAL_CAPSULE | Freq: Once | ORAL | Status: AC
  Administered 2021-01-15: 25 mg via ORAL
  Filled 2021-01-15: qty 1

## 2021-01-15 MED ORDER — DARATUMUMAB-HYALURONIDASE-FIHJ 1800-30000 MG-UT/15ML ~~LOC~~ SOLN
1800.0000 mg | Freq: Once | SUBCUTANEOUS | Status: AC
  Administered 2021-01-15: 1800 mg via SUBCUTANEOUS
  Filled 2021-01-15: qty 15

## 2021-01-15 MED ORDER — ZOLEDRONIC ACID 4 MG/100ML IV SOLN
4.0000 mg | Freq: Once | INTRAVENOUS | Status: AC
  Administered 2021-01-15: 4 mg via INTRAVENOUS
  Filled 2021-01-15: qty 100

## 2021-01-15 NOTE — Patient Instructions (Signed)
[facility] CTR AT [place]  Discharge Instructions: Thank you for choosing [facility] to provide your oncology and hematology care.  If you have a lab appointment with the [facility], please go directly to the [facility] and check in at the registration area.  Wear comfortable clothing and clothing appropriate for easy access to any Portacath or PICC line.   We strive to give you quality time with your provider. You may need to reschedule your appointment if you arrive late (15 or more minutes).  Arriving late affects you and other patients whose appointments are after yours.  Also, if you miss three or more appointments without notifying the office, you may be dismissed from the clinic at the providers discretion.      For prescription refill requests, have your pharmacy contact our office and allow 72 hours for refills to be completed.    Today you received the following chemotherapy and/or immunotherapy agents DARZALEX and ZOMETA      To help prevent nausea and vomiting after your treatment, we encourage you to take your nausea medication as directed.  BELOW ARE SYMPTOMS THAT SHOULD BE REPORTED IMMEDIATELY: *FEVER GREATER THAN 100.4 F (38 C) OR HIGHER *CHILLS OR SWEATING *NAUSEA AND VOMITING THAT IS NOT CONTROLLED WITH YOUR NAUSEA MEDICATION *UNUSUAL SHORTNESS OF BREATH *UNUSUAL BRUISING OR BLEEDING *URINARY PROBLEMS (pain or burning when urinating, or frequent urination) *BOWEL PROBLEMS (unusual diarrhea, constipation, pain near the anus) TENDERNESS IN MOUTH AND THROAT WITH OR WITHOUT PRESENCE OF ULCERS (sore throat, sores in mouth, or a toothache) UNUSUAL RASH, SWELLING OR PAIN  UNUSUAL VAGINAL DISCHARGE OR ITCHING   Items with * indicate a potential emergency and should be followed up as soon as possible or go to the Emergency Department if any problems should occur.  Please show the CHEMOTHERAPY ALERT CARD or IMMUNOTHERAPY ALERT CARD at  check-in to the Emergency Department and triage nurse.  Should you have questions after your visit or need to cancel or reschedule your appointment, please contact [facility] [place] AT [place]  [phone] and follow the prompts.  Office hours are 8:00 a.m. to 4:30 p.m. Monday - Friday. Please note that voicemails left after 4:00 p.m. may not be returned until the following business day.  We are closed weekends and major holidays. You have access to a nurse at all times for urgent questions. Please call the main number to the clinic [phone] and follow the prompts.  For any non-urgent questions, you may also contact your provider using MyChart. We now offer e-Visits for anyone 32 and older to request care online for non-urgent symptoms. For details visit [email or web address].   Also download the MyChart app! Go to the app store, search "MyChart", open the app, select [facility], and log in with your MyChart username and password.  Due to Covid, a mask is required upon entering the hospital/clinic. If you do not have a mask, one will be given to you upon arrival. For doctor visits, patients may have 1 support person aged 81 or older with them. For treatment visits, patients cannot have anyone with them due to current Covid guidelines and our immunocompromised population.   Daratumumab injection What is this medication? DARATUMUMAB (dar a toom ue mab) is a monoclonal antibody. It is used to treat multiple myeloma. This medicine may be used for other purposes; ask your health care provider or pharmacist if you have questions. COMMON BRAND NAME(S): DARZALEX What should I tell my care team  before I take this medication? They need to know if you have any of these conditions: hereditary fructose intolerance infection (especially a virus infection such as chickenpox, herpes, or hepatitis B virus) lung or breathing disease (asthma, COPD) an unusual or allergic reaction to  daratumumab, sorbitol, other medicines, foods, dyes, or preservatives pregnant or trying to get pregnant breast-feeding How should I use this medication? This medicine is for infusion into a vein. It is given by a health care professional in a hospital or clinic setting. Talk to your pediatrician regarding the use of this medicine in children. Special care may be needed. Overdosage: If you think you have taken too much of this medicine contact a poison control center or emergency room at once. NOTE: This medicine is only for you. Do not share this medicine with others. What if I miss a dose? Keep appointments for follow-up doses as directed. It is important not to miss your dose. Call your doctor or health care professional if you are unable to keep an appointment. What may interact with this medication? Interactions have not been studied. This list may not describe all possible interactions. Give your health care provider a list of all the medicines, herbs, non-prescription drugs, or dietary supplements you use. Also tell them if you smoke, drink alcohol, or use illegal drugs. Some items may interact with your medicine. What should I watch for while using this medication? Your condition will be monitored carefully while you are receiving this medicine. This medicine can cause serious allergic reactions. To reduce your risk, your health care provider may give you other medicine to take before receiving this one. Be sure to follow the directions from your health care provider. This medicine can affect the results of blood tests to match your blood type. These changes can last for up to 6 months after the final dose. Your healthcare provider will do blood tests to match your blood type before you start treatment. Tell all of your healthcare providers that you are being treated with this medicine before receiving a blood transfusion. This medicine can affect the results of some tests used to determine  treatment response; extra tests may be needed to evaluate response. Do not become pregnant while taking this medicine or for 3 months after stopping it. Women should inform their health care provider if they wish to become pregnant or think they might be pregnant. There is a potential for serious side effects to an unborn child. Talk to your health care provider for more information. Do not breast-feed an infant while taking this medicine. What side effects may I notice from receiving this medication? Side effects that you should report to your care team as soon as possible: Allergic reactions--skin rash, itching or hives, swelling of the face, lips, or tongue Blurred vision Infection--fever, chills, cough, sore throat, pain or trouble passing urine Infusion reactions--dizziness, fast heartbeat, feeling faint or lightheaded, falls, headache, increase in blood pressure, nausea, vomiting, or wheezing or trouble breathing with loud or whistling sounds Unusual bleeding or bruising Side effects that usually do not require medical attention (report to your care team if they continue or are bothersome): Constipation Diarrhea Pain, tingling, numbness in the hands or feet Swelling of the ankles, feet, hands Tiredness This list may not describe all possible side effects. Call your doctor for medical advice about side effects. You may report side effects to FDA at 1-800-FDA-1088. Where should I keep my medication? This drug is given in a hospital or clinic and  will not be stored at home. NOTE: This sheet is a summary. It may not cover all possible information. If you have questions about this medicine, talk to your doctor, pharmacist, or health care provider.  2022 Elsevier/Gold Standard (2020-06-28 00:00:00)  Zoledronic Acid Injection (Hypercalcemia, Oncology) What is this medication? ZOLEDRONIC ACID (ZOE le dron ik AS id) slows calcium loss from bones. It high calcium levels in the blood from some  kinds of cancer. It may be used in other people at risk for bone loss. This medicine may be used for other purposes; ask your health care provider or pharmacist if you have questions. COMMON BRAND NAME(S): Zometa What should I tell my care team before I take this medication? They need to know if you have any of these conditions: cancer dehydration dental disease kidney disease liver disease low levels of calcium in the blood lung or breathing disease (asthma) receiving steroids like dexamethasone or prednisone an unusual or allergic reaction to zoledronic acid, other medicines, foods, dyes, or preservatives pregnant or trying to get pregnant breast-feeding How should I use this medication? This drug is injected into a vein. It is given by a health care provider in a hospital or clinic setting. Talk to your health care provider about the use of this drug in children. Special care may be needed. Overdosage: If you think you have taken too much of this medicine contact a poison control center or emergency room at once. NOTE: This medicine is only for you. Do not share this medicine with others. What if I miss a dose? Keep appointments for follow-up doses. It is important not to miss your dose. Call your health care provider if you are unable to keep an appointment. What may interact with this medication? certain antibiotics given by injection NSAIDs, medicines for pain and inflammation, like ibuprofen or naproxen some diuretics like bumetanide, furosemide teriparatide thalidomide This list may not describe all possible interactions. Give your health care provider a list of all the medicines, herbs, non-prescription drugs, or dietary supplements you use. Also tell them if you smoke, drink alcohol, or use illegal drugs. Some items may interact with your medicine. What should I watch for while using this medication? Visit your health care provider for regular checks on your progress. It may  be some time before you see the benefit from this drug. Some people who take this drug have severe bone, joint, or muscle pain. This drug may also increase your risk for jaw problems or a broken thigh bone. Tell your health care provider right away if you have severe pain in your jaw, bones, joints, or muscles. Tell you health care provider if you have any pain that does not go away or that gets worse. Tell your dentist and dental surgeon that you are taking this drug. You should not have major dental surgery while on this drug. See your dentist to have a dental exam and fix any dental problems before starting this drug. Take good care of your teeth while on this drug. Make sure you see your dentist for regular follow-up appointments. You should make sure you get enough calcium and vitamin D while you are taking this drug. Discuss the foods you eat and the vitamins you take with your health care provider. Check with your health care provider if you have severe diarrhea, nausea, and vomiting, or if you sweat a lot. The loss of too much body fluid may make it dangerous for you to take this drug. You  may need blood work done while you are taking this drug. Do not become pregnant while taking this drug. Women should inform their health care provider if they wish to become pregnant or think they might be pregnant. There is potential for serious harm to an unborn child. Talk to your health care provider for more information. What side effects may I notice from receiving this medication? Side effects that you should report to your doctor or health care provider as soon as possible: allergic reactions (skin rash, itching or hives; swelling of the face, lips, or tongue) bone pain infection (fever, chills, cough, sore throat, pain or trouble passing urine) jaw pain, especially after dental work joint pain kidney injury (trouble passing urine or change in the amount of urine) low blood pressure (dizziness;  feeling faint or lightheaded, falls; unusually weak or tired) low calcium levels (fast heartbeat; muscle cramps or pain; pain, tingling, or numbness in the hands or feet; seizures) low magnesium levels (fast, irregular heartbeat; muscle cramp or pain; muscle weakness; tremors; seizures) low red blood cell counts (trouble breathing; feeling faint; lightheaded, falls; unusually weak or tired) muscle pain redness, blistering, peeling, or loosening of the skin, including inside the mouth severe diarrhea swelling of the ankles, feet, hands trouble breathing Side effects that usually do not require medical attention (report to your doctor or health care provider if they continue or are bothersome): anxious constipation coughing depressed mood eye irritation, itching, or pain fever general ill feeling or flu-like symptoms nausea pain, redness, or irritation at site where injected trouble sleeping This list may not describe all possible side effects. Call your doctor for medical advice about side effects. You may report side effects to FDA at 1-800-FDA-1088. Where should I keep my medication? This drug is given in a hospital or clinic. It will not be stored at home. NOTE: This sheet is a summary. It may not cover all possible information. If you have questions about this medicine, talk to your doctor, pharmacist, or health care provider.  2022 Elsevier/Gold Standard (2020-10-09 00:00:00)

## 2021-01-15 NOTE — Progress Notes (Signed)
Nutrition Follow-up:    Patient with multiple myeloma.  Patient receiving darzalex, zometa.  Met with patient during infusion.  Patient reports that he has not been doing well since the last treatment.  Reports that he has had increased hip pain.  Planning to increase pain medication and get xray today.  Continue to try and eat as much as he can.  Drinking carnation breakfast essentials shake.      Medications: reviewed  Labs: reviewed  Anthropometrics:   Weight 175 lb 3.2 oz today  182 lb 12.8 oz on 11/15 184 lb on 10/11 172 lb 3.2 oz on 9/28 178 lb on 9/12 179 lb on 8/30   NUTRITION DIAGNOSIS: Inadequate oral intake continues   INTERVENTION:  Patient to continue oral nutrition supplement 1-2 times per day Eat high calorie, high protein foods as often as possible    MONITORING, EVALUATION, GOAL: weight trends, intake   NEXT VISIT: Tuesday, Jan 10 during infusion  Duvall Comes B. Zenia Resides, [place], [place] Registered Dietitian [phone] (mobile)

## 2021-01-15 NOTE — Progress Notes (Signed)
Pt c/o chronic weakness and reports taking pain medication this morning. Pt also has mild swelling to left ankle.

## 2021-01-15 NOTE — Progress Notes (Signed)
Per secure chat from Dr Grayland Ormond. Pt does not need to be monitored post Darzalex shot.  He should proceed to get his xrays.

## 2021-01-16 LAB — KAPPA/LAMBDA LIGHT CHAINS
Kappa free light chain: 41.7 mg/L — ABNORMAL HIGH (ref 3.3–19.4)
Kappa, lambda light chain ratio: 6.95 — ABNORMAL HIGH (ref 0.26–1.65)
Lambda free light chains: 6 mg/L (ref 5.7–26.3)

## 2021-01-16 LAB — IGG, IGA, IGM
IgA: 23 mg/dL — ABNORMAL LOW (ref 61–437)
IgG (Immunoglobin G), Serum: 630 mg/dL (ref 603–1613)
IgM (Immunoglobulin M), Srm: 16 mg/dL (ref 15–143)

## 2021-01-18 LAB — PROTEIN ELECTROPHORESIS, SERUM
A/G Ratio: 1.6 (ref 0.7–1.7)
Albumin ELP: 3.1 g/dL (ref 2.9–4.4)
Alpha-1-Globulin: 0.2 g/dL (ref 0.0–0.4)
Alpha-2-Globulin: 0.5 g/dL (ref 0.4–1.0)
Beta Globulin: 0.6 g/dL — ABNORMAL LOW (ref 0.7–1.3)
Gamma Globulin: 0.5 g/dL (ref 0.4–1.8)
Globulin, Total: 1.9 g/dL — ABNORMAL LOW (ref 2.2–3.9)
M-Spike, %: 0.4 g/dL — ABNORMAL HIGH
Total Protein ELP: 5 g/dL — ABNORMAL LOW (ref 6.0–8.5)

## 2021-01-21 ENCOUNTER — Telehealth: Payer: Self-pay | Admitting: *Deleted

## 2021-01-21 NOTE — Telephone Encounter (Signed)
Call returned to [facility] and informed of response by NP. She confirmed upcoming appts .

## 2021-01-21 NOTE — Telephone Encounter (Signed)
Would you mind calling them back and letting them know that no acute fracture or abnormality noted.  Appears to be healing from previous fall.  Faythe Casa, NP 01/21/2021 3:49 PM

## 2021-01-21 NOTE — Telephone Encounter (Signed)
Who ordered the X-ray?  Faythe Casa, NP 01/21/2021 3:46 PM

## 2021-01-21 NOTE — Telephone Encounter (Signed)
Daughter called asking for results of xray done last week    IMPRESSION: Mild flattening of the right femoral head with marked severity degenerative changes of the right hip. This corresponds to findings consistent with a healing subchondral fracture of the right femoral head seen on the pelvis MRI.     Electronically Signed   By: Virgina Norfolk M.D.   On: 01/16/2021 20:28

## 2021-02-07 ENCOUNTER — Telehealth: Payer: Self-pay | Admitting: Oncology

## 2021-02-07 NOTE — Telephone Encounter (Signed)
Daughter called to reschedule pt's appt on 1-10.call back at [phone]. Daughter-Barbara

## 2021-02-10 NOTE — Progress Notes (Deleted)
[facility]  [phone] [phone]) [phone]  ID: Dean [name]. OB: 01-21-1936  MR#: [id number]  HAL#:[id number]  Patient Care Team: [name], MD as PCP - General (Family Medicine) Gery Pray, MD as Consulting Physician (Radiation Oncology) Danie Binder, MD (Inactive) as Consulting Physician (Gastroenterology) Michael Boston, MD as Consulting Physician (General Surgery)  CHIEF COMPLAINT: Multiple myeloma  INTERVAL HISTORY: Patient returns to clinic today for repeat laboratory work, further evaluation, and continuation of treatment with Darzalex and Zometa.  He continues to have right hip pain secondary to his fall several months ago, but otherwise feels well.  He has anxiety secondary to being the primary caretaker of his wife who has dementia.  He has no neurologic complaints.  He denies any recent fevers or illnesses.  He has a good appetite and denies weight loss.  He has no chest pain, shortness of breath, cough, or hemoptysis.  He denies any nausea, vomiting, constipation, or diarrhea.  He has no urinary complaints.  Patient offers no further specific complaints today.  REVIEW OF SYSTEMS:   Review of Systems  Constitutional:  Positive for malaise/fatigue. Negative for fever and weight loss.  Respiratory: Negative.  Negative for cough, hemoptysis and shortness of breath.   Cardiovascular: Negative.  Negative for chest pain and leg swelling.  Gastrointestinal: Negative.  Negative for abdominal pain and nausea.  Genitourinary: Negative.  Negative for dysuria.  Musculoskeletal:  Positive for joint pain. Negative for back pain and falls.  Skin: Negative.  Negative for rash.  Neurological:  Positive for sensory change and weakness. Negative for dizziness, focal weakness and headaches.  Psychiatric/Behavioral:  The patient is nervous/anxious. The patient does not have insomnia.    As per HPI. Otherwise, a complete review of systems is  negative.  PAST MEDICAL HISTORY: Past Medical History:  Diagnosis Date   BPH (benign prostatic hyperplasia)    Colonic diverticular abscess 01/08/2015   Diverticulitis 73/53/29   complicated by abscess and required percutaneous drainage   GERD (gastroesophageal reflux disease)    H/O ETOH abuse    History of chemotherapy last done jan 2017   History of radiation therapy 01/05/13-02/10/13   45 gray to left occipital condyle region   History of radiation therapy 12/01/16-12/10/16   Parasternal nodule, chest- 24 Gy total delivered in 8 fractions, Left sacro-iliac, pelvis- 24 Gy total delivered in 8 fractions    History of radiation therapy 02/19/17-02/22/17   right temporal scalp 30 Gy in 10 fractions   Intra-abdominal abscess (HCC)    Multiple myeloma ([facility]) 2015   Neuropathy    Radiation 02/21/14-03/08/14   right posterior chest wall area 30 gray   Radiation 04/19/14-05/02/14   lumbar spine 25 gray   Skull lesion    Left occipital condyle   Wrist fracture, left    x 2    PAST SURGICAL HISTORY: Past Surgical History:  Procedure Laterality Date   COLONOSCOPY N/A 04/19/2015   Procedure: COLONOSCOPY;  Surgeon: Danie Binder, MD;  Location: [facility];  Service: Endoscopy;  Laterality: N/A;  1:30 PM   CYST EXCISION  1959   tail bone   IR IMAGING GUIDED PORT INSERTION  09/25/2017    FAMILY HISTORY: Family History  Problem Relation Age of Onset   Diabetes Mother    Stroke Mother    Hypertension Father     ADVANCED DIRECTIVES (Y/N):  N  HEALTH MAINTENANCE: Social History   Tobacco Use   Smoking status: Never  Smokeless tobacco: Never  Vaping Use   Vaping Use: Never used  Substance Use Topics   Alcohol use: No    Comment: " Not much no more"   Drug use: No     Colonoscopy:  PAP:  Bone density:  Lipid panel:  Allergies  Allergen Reactions   Codeine Other (See Comments)    Headache   Morphine And Related Other (See Comments)    Makes him feel weird   Revlimid  [Lenalidomide] Other (See Comments)    "Causes me to become weak"    Current Outpatient Medications  Medication Sig Dispense Refill   acyclovir (ZOVIRAX) 400 MG tablet TAKE 1 TABLET BY MOUTH TWICE A DAY 60 tablet 6   ALPRAZolam (XANAX) 1 MG tablet TAKE 1 TABLET BY MOUTH 3 TIMES A DAY AS NEEDED FOR ANXIETY. 60 tablet 3   CALCIUM-VITAMIN D PO Take 1 tablet by mouth 2 (two) times daily.      cephALEXin (KEFLEX) 500 MG capsule Take 1 capsule (500 mg total) by mouth 3 (three) times daily. 21 capsule 0   dexamethasone (DECADRON) 4 MG tablet      fluticasone (FLONASE) 50 MCG/ACT nasal spray Place 2 sprays into both nostrils daily. 16 g 6   furosemide (LASIX) 20 MG tablet TAKE 1 TABLET BY MOUTH ONCE A DAY AS NEEDED 30 tablet 3   gabapentin (NEURONTIN) 400 MG capsule Take 1 capsule (400 mg total) by mouth 3 (three) times daily. 90 capsule 4   Homeopathic Products (THERAWORX RELIEF EX) Apply topically as needed.     ibuprofen (ADVIL) 600 MG tablet Take 1 tablet (600 mg total) by mouth every 8 (eight) hours as needed. 30 tablet 0   lidocaine-prilocaine (EMLA) cream Apply small amount over port one (1) hour prior to appointment. 30 g 0   montelukast (SINGULAIR) 10 MG tablet TAKE 1 TABLET BY MOUTH EVERY NIGHT AT BEDTIME 30 tablet 3   omeprazole (PRILOSEC) 20 MG capsule TAKE 1 CAPSULE BY MOUTH ONCE DAILY 30 capsule 5   oxybutynin (DITROPAN-XL) 10 MG 24 hr tablet Take 1 tablet (10 mg total) by mouth at bedtime. 30 tablet 1   Oxycodone HCl 10 MG TABS TAKE 1 TABLET BY MOUTH IN THE MORNING ATNOON AND AT BEDTIME FOR PAIN 120 tablet 0   senna-docusate (SENOKOT-S) 8.6-50 MG tablet Take 1 tablet by mouth 2 (two) times daily. 60 tablet 5   silver sulfADIAZINE (SILVADENE) 1 % cream Apply 1 application topically daily. 50 g 0   tamsulosin (FLOMAX) 0.4 MG CAPS capsule TAKE ONE CAPSULE BY MOUTH EVERY NIGHT ATBEDTIME 30 capsule 11   traZODone (DESYREL) 100 MG tablet TAKE 3 TABLETS BY MOUTH AT BEDTIME 90 tablet 1    triamcinolone cream (KENALOG) 0.1 % APPLY 1 APPLICATION TOPICALLY TWICE A DAY 30 g 0   TURMERIC PO Take by mouth.     zolendronic acid (ZOMETA) 4 MG/5ML injection Inject 4 mg into the vein every 8 (eight) weeks.     No current facility-administered medications for this visit.   Facility-Administered Medications Ordered in Other Visits  Medication Dose Route Frequency Provider Last Rate Last Admin   heparin lock flush 100 unit/mL  500 Units Intravenous Once Lloyd Huger, MD       heparin lock flush 100 unit/mL  500 Units Intracatheter Once PRN Lloyd Huger, MD        OBJECTIVE: There were no vitals filed for this visit.    There is no height or weight  on file to calculate BMI.    ECOG FS:1 - Symptomatic but completely ambulatory  General: Well-developed, well-nourished, no acute distress.  Sitting in a wheelchair. Eyes: Pink conjunctiva, anicteric sclera. HEENT: Normocephalic, moist mucous membranes. Lungs: No audible wheezing or coughing. Heart: Regular rate and rhythm. Abdomen: Soft, nontender, no obvious distention. Musculoskeletal: No edema, cyanosis, or clubbing. Neuro: Alert, answering all questions appropriately. Cranial nerves grossly intact. Skin: No rashes or petechiae noted. Psych: Normal affect.   LAB RESULTS:  Lab Results  Component Value Date   NA 138 01/15/2021   K 3.9 01/15/2021   CL 102 01/15/2021   CO2 29 01/15/2021   GLUCOSE 86 01/15/2021   BUN 23 01/15/2021   CREATININE 0.66 01/15/2021   CALCIUM 8.3 (L) 01/15/2021   PROT 5.3 (L) 01/15/2021   ALBUMIN 3.3 (L) 01/15/2021   AST 15 01/15/2021   ALT 8 01/15/2021   ALKPHOS 20 (L) 01/15/2021   BILITOT 0.5 01/15/2021   GFRNONAA >60 01/15/2021   GFRAA >60 10/13/2019    Lab Results  Component Value Date   WBC 4.4 01/15/2021   NEUTROABS 2.2 01/15/2021   HGB 12.6 (L) 01/15/2021   HCT 37.4 (L) 01/15/2021   MCV 94.2 01/15/2021   PLT 109 (L) 01/15/2021     STUDIES: DG HIP UNILAT WITH  PELVIS 2-3 VIEWS RIGHT  Result Date: 01/16/2021 CLINICAL DATA:  Right hip pain radiating across the lower back. EXAM: DG HIP (WITH OR WITHOUT PELVIS) 2-3V RIGHT COMPARISON:  Right hip plain films, dated May 23, 2020 and pelvis MRI dated Jun 06, 2020 FINDINGS: There is mild flattening of the right femoral head. This is mildly increased in severity when compared to the prior study. There is no evidence of dislocation. Marked severity degenerative changes are seen in the form of joint space narrowing and acetabular sclerosis. IMPRESSION: Mild flattening of the right femoral head with marked severity degenerative changes of the right hip. This corresponds to findings consistent with a healing subchondral fracture of the right femoral head seen on the pelvis MRI. Electronically Signed   By: Virgina Norfolk M.D.   On: 01/16/2021 20:28    ASSESSMENT: Multiple myeloma  PLAN:  Multiple myeloma: Please see note from September 17, 2020 for historic details of his myeloma and treatment.  His most recent M spike was chronic and unchanged at 0.2 with a kappa free light chain mildly elevated at 32.3.  He is currently receiving maintenance Darzalex every 4 weeks and Zometa every 8 weeks on the odd-numbered cycles.  Will continue current regimen at this time, but will consider discontinuing in the near future.  Proceed with treatment today.  Return to clinic in 4 weeks for laboratory work and Darzalex only.  Patient will then return to clinic in 8 weeks for consideration of cycle 41.   Thrombocytopenia: Chronic and unchanged.  Patient platelet count is 109 today.  Back/joint pain: Chronic and unchanged.  Continue oxycodone as prescribed.  Will send patient for x-ray to ensure no bony abnormality. Peripheral neuropathy: Chronic and unchanged.  Continue gabapentin as prescribed. Insomnia/anxiety: Chronic and unchanged.  Continue Xanax and trazodone as needed.   Patient expressed understanding and was in agreement with  this plan. He also understands that He can call clinic at any time with any questions, concerns, or complaints.    Cancer Staging  No matching staging information was found for the patient.  Lloyd Huger, MD   02/10/2021 8:12 AM

## 2021-02-11 ENCOUNTER — Telehealth: Payer: Self-pay | Admitting: Oncology

## 2021-02-11 ENCOUNTER — Other Ambulatory Visit: Payer: Self-pay | Admitting: Oncology

## 2021-02-11 ENCOUNTER — Other Ambulatory Visit: Payer: Self-pay | Admitting: Family Medicine

## 2021-02-11 ENCOUNTER — Other Ambulatory Visit: Payer: Self-pay | Admitting: *Deleted

## 2021-02-11 DIAGNOSIS — C9000 Multiple myeloma not having achieved remission: Secondary | ICD-10-CM

## 2021-02-11 MED ORDER — OXYCODONE HCL 10 MG PO TABS: 10.0000 mg | ORAL_TABLET | Freq: Three times a day (TID) | ORAL | 0 refills | Status: DC | PRN

## 2021-02-11 NOTE — Telephone Encounter (Signed)
Pharmacy called stating that family reports that patient was told he can increase his pain medicine to Oxycodone 10 mg 2 tabs three times a day as needed and that is how they have been giving it to him need new prescription sent if this is the case

## 2021-02-11 NOTE — Telephone Encounter (Signed)
Spoke with [name], patient's daughter. She is the caregiver for multiple family members and is unable to watch patient's wife (who has advanced dementia) on 02/13/20 during the patient's infusion time. Rescheduled patient to 02/14/20. [name] will make the patient aware.   Tiffany, please advise on whether we need to reschedule his next appointment on 03/12/21 (which would be one day earlier).

## 2021-02-11 NOTE — Telephone Encounter (Signed)
Daughter called to reschedule pt's appt for 1-10.call back at [phone]

## 2021-02-12 ENCOUNTER — Inpatient Hospital Stay: Payer: Medicare Other

## 2021-02-12 ENCOUNTER — Other Ambulatory Visit: Payer: Medicare Other

## 2021-02-12 ENCOUNTER — Inpatient Hospital Stay: Payer: Medicare Other | Admitting: Oncology

## 2021-02-12 ENCOUNTER — Ambulatory Visit: Payer: Medicare Other

## 2021-02-12 DIAGNOSIS — C9000 Multiple myeloma not having achieved remission: Secondary | ICD-10-CM

## 2021-02-13 ENCOUNTER — Telehealth: Payer: Self-pay | Admitting: Oncology

## 2021-02-13 ENCOUNTER — Inpatient Hospital Stay (HOSPITAL_BASED_OUTPATIENT_CLINIC_OR_DEPARTMENT_OTHER): Payer: Medicare Other | Admitting: Oncology

## 2021-02-13 ENCOUNTER — Inpatient Hospital Stay: Payer: Medicare Other | Attending: Oncology

## 2021-02-13 ENCOUNTER — Inpatient Hospital Stay: Payer: Medicare Other

## 2021-02-13 ENCOUNTER — Other Ambulatory Visit: Payer: Self-pay

## 2021-02-13 ENCOUNTER — Encounter: Payer: Self-pay | Admitting: Oncology

## 2021-02-13 VITALS — BP 96/58 | HR 52 | Temp 97.6°F | Resp 18 | Wt 176.4 lb

## 2021-02-13 VITALS — BP 118/56 | HR 54 | Resp 18

## 2021-02-13 DIAGNOSIS — I959 Hypotension, unspecified: Secondary | ICD-10-CM | POA: Insufficient documentation

## 2021-02-13 DIAGNOSIS — Z95828 Presence of other vascular implants and grafts: Secondary | ICD-10-CM

## 2021-02-13 DIAGNOSIS — R5383 Other fatigue: Secondary | ICD-10-CM | POA: Insufficient documentation

## 2021-02-13 DIAGNOSIS — C9000 Multiple myeloma not having achieved remission: Secondary | ICD-10-CM

## 2021-02-13 DIAGNOSIS — R531 Weakness: Secondary | ICD-10-CM | POA: Insufficient documentation

## 2021-02-13 LAB — CBC WITH DIFFERENTIAL/PLATELET
Abs Immature Granulocytes: 0.01 10*3/uL (ref 0.00–0.07)
Basophils Absolute: 0 10*3/uL (ref 0.0–0.1)
Basophils Relative: 0 %
Eosinophils Absolute: 0 10*3/uL (ref 0.0–0.5)
Eosinophils Relative: 0 %
HCT: 37.5 % — ABNORMAL LOW (ref 39.0–52.0)
Hemoglobin: 12.2 g/dL — ABNORMAL LOW (ref 13.0–17.0)
Immature Granulocytes: 0 %
Lymphocytes Relative: 25 %
Lymphs Abs: 1.2 10*3/uL (ref 0.7–4.0)
MCH: 30.7 pg (ref 26.0–34.0)
MCHC: 32.5 g/dL (ref 30.0–36.0)
MCV: 94.5 fL (ref 80.0–100.0)
Monocytes Absolute: 0.5 10*3/uL (ref 0.1–1.0)
Monocytes Relative: 11 %
Neutro Abs: 3 10*3/uL (ref 1.7–7.7)
Neutrophils Relative %: 64 %
Platelets: 112 10*3/uL — ABNORMAL LOW (ref 150–400)
RBC: 3.97 MIL/uL — ABNORMAL LOW (ref 4.22–5.81)
RDW: 13.4 % (ref 11.5–15.5)
WBC: 4.7 10*3/uL (ref 4.0–10.5)
nRBC: 0 % (ref 0.0–0.2)

## 2021-02-13 LAB — COMPREHENSIVE METABOLIC PANEL
ALT: 10 U/L (ref 0–44)
AST: 14 U/L — ABNORMAL LOW (ref 15–41)
Albumin: 3.4 g/dL — ABNORMAL LOW (ref 3.5–5.0)
Alkaline Phosphatase: 20 U/L — ABNORMAL LOW (ref 38–126)
Anion gap: 4 — ABNORMAL LOW (ref 5–15)
BUN: 23 mg/dL (ref 8–23)
CO2: 29 mmol/L (ref 22–32)
Calcium: 8.3 mg/dL — ABNORMAL LOW (ref 8.9–10.3)
Chloride: 104 mmol/L (ref 98–111)
Creatinine, Ser: 0.63 mg/dL (ref 0.61–1.24)
GFR, Estimated: >60 mL/min (ref >60)
Glucose, Bld: 89 mg/dL (ref 70–99)
Potassium: 3.8 mmol/L (ref 3.5–5.1)
Sodium: 137 mmol/L (ref 135–145)
Total Bilirubin: 0.7 mg/dL (ref 0.3–1.2)
Total Protein: 5.7 g/dL — ABNORMAL LOW (ref 6.5–8.1)

## 2021-02-13 LAB — MAGNESIUM: Magnesium: 1.9 mg/dL (ref 1.7–2.4)

## 2021-02-13 MED ORDER — HEPARIN SOD (PORK) LOCK FLUSH 100 UNIT/ML IV SOLN
500.0000 [IU] | Freq: Once | INTRAVENOUS | Status: DC | PRN
  Filled 2021-02-13: qty 5

## 2021-02-13 MED ORDER — SODIUM CHLORIDE 0.9 % IV SOLN
Freq: Once | INTRAVENOUS | Status: AC
  Filled 2021-02-13: qty 250

## 2021-02-13 MED ORDER — SODIUM CHLORIDE 0.9% FLUSH
10.0000 mL | Freq: Once | INTRAVENOUS | Status: AC
  Administered 2021-02-13: 10 mL via INTRAVENOUS
  Filled 2021-02-13: qty 10

## 2021-02-13 NOTE — Telephone Encounter (Signed)
Called and spoke with the patients daughter [name] to request that someone attend the visit with Mr. Alverio if he is required to bring his wife. The center was concern for her safety as the patient seemed very confused with her surroundings. Staff members were asked to keep a eye on her until he completed his infusion. Daughter acknowledge that her father's wife does have dementia and was suppose to stay with her today. Due to the father running late he chose to bring her with him. Mr. Matuszak was also made aware that any appointments in the future that would require him to bring his spouse, he would need to have some else to attend. Mr. Torpey verbalize understanding.

## 2021-02-13 NOTE — Patient Instructions (Signed)
[facility] AT [place]  Discharge Instructions: Thank you for choosing [facility] to provide your oncology and hematology care.  If you have a lab appointment with the [facility], please go directly to the [facility] and check in at the registration area.  Wear comfortable clothing and clothing appropriate for easy access to any Portacath or PICC line.   We strive to give you quality time with your provider. You may need to reschedule your appointment if you arrive late (15 or more minutes).  Arriving late affects you and other patients whose appointments are after yours.  Also, if you miss three or more appointments without notifying the office, you may be dismissed from the clinic at the providers discretion.      For prescription refill requests, have your pharmacy contact our office and allow 72 hours for refills to be completed.    Today you received the following chemotherapy and/or immunotherapy agents One liter IV Fluids       To help prevent nausea and vomiting after your treatment, we encourage you to take your nausea medication as directed.  BELOW ARE SYMPTOMS THAT SHOULD BE REPORTED IMMEDIATELY: *FEVER GREATER THAN 100.4 F (38 C) OR HIGHER *CHILLS OR SWEATING *NAUSEA AND VOMITING THAT IS NOT CONTROLLED WITH YOUR NAUSEA MEDICATION *UNUSUAL SHORTNESS OF BREATH *UNUSUAL BRUISING OR BLEEDING *URINARY PROBLEMS (pain or burning when urinating, or frequent urination) *BOWEL PROBLEMS (unusual diarrhea, constipation, pain near the anus) TENDERNESS IN MOUTH AND THROAT WITH OR WITHOUT PRESENCE OF ULCERS (sore throat, sores in mouth, or a toothache) UNUSUAL RASH, SWELLING OR PAIN  UNUSUAL VAGINAL DISCHARGE OR ITCHING   Items with * indicate a potential emergency and should be followed up as soon as possible or go to the Emergency Department if any problems should occur.  Please show the CHEMOTHERAPY ALERT CARD or IMMUNOTHERAPY ALERT CARD at  check-in to the Emergency Department and triage nurse.  Should you have questions after your visit or need to cancel or reschedule your appointment, please contact [facility] [place] AT [place]  [phone] and follow the prompts.  Office hours are 8:00 a.m. to 4:30 p.m. Monday - Friday. Please note that voicemails left after 4:00 p.m. may not be returned until the following business day.  We are closed weekends and major holidays. You have access to a nurse at all times for urgent questions. Please call the main number to the clinic [phone] and follow the prompts.  For any non-urgent questions, you may also contact your provider using MyChart. We now offer e-Visits for anyone 77 and older to request care online for non-urgent symptoms. For details visit [email or web address].   Also download the MyChart app! Go to the app store, search "MyChart", open the app, select [facility], and log in with your MyChart username and password.  Due to Covid, a mask is required upon entering the hospital/clinic. If you do not have a mask, one will be given to you upon arrival. For doctor visits, patients may have 1 support person aged 33 or older with them. For treatment visits, patients cannot have anyone with them due to current Covid guidelines and our immunocompromised population.   Dehydration, Elderly  Dehydration is condition in which there is not enough water or other fluids in the body. This happens when a person loses more fluids than he or she takes in. Important body parts cannot work right without the right amount of fluids. Any loss of fluids from the  body can cause dehydration. People 57 years of age or older have a higher risk of dehydration than younger adults. This is because in older age, the body: Is less able to keep the right amount of water. Does not respond to temperature changes as well. Does not get a sense of thirst as easily or quickly. Dehydration can be mild,  worse, or very bad. It should be treated right away to keep it from getting very bad. What are the causes? This condition may be caused by: Conditions that cause loss of water or other fluids, such as: Watery poop (diarrhea). Vomiting. Sweating a lot. Peeing (urinating) a lot. Not drinking enough fluids, especially when you: Are ill. Are doing things that take a lot of energy to do. Other illnesses and conditions, such as fever or infection. Certain medicines, such as medicines that take extra fluid out of the body (diuretics). Lack of safe drinking water. Not being able to get enough water and food. What increases the risk? The following factors may make you more likely to develop this condition: Having a long-term (chronic) illness that has not been treated the right way, such as: An illness that may cause you to pee more, such as diabetes. Kidney, heart, or lung disease. A condition such as dementia. This affects: The brain and nervous system. Thinking. Feelings. Being 35 years of age or older. Having a disability. Living in a place that is high above the ground or sea (high in altitude). The thinner, drier air causes more fluid loss. What are the signs or symptoms? Symptoms of dehydration depend on how bad it is. Mild or worse dehydration Thirst. Dry lips or dry mouth. Feeling dizzy or light-headed, especially when you stand up from sitting. Muscle cramps. Your body making: Dark pee (urine). Pee may be the color of tea. Less pee than normal. Less tears than normal. Headache. Very bad dehydration Changes in skin. Skin may: Be cold to the touch (clammy). Be blotchy or pale. Not go back to normal right after you lightly pinch it and let it go. Little or no tears, pee, or sweat. Changes in vital signs, such as: Fast breathing. Low blood pressure. Weak pulse. Pulse that is more than 100 beats a minute when you are sitting still. Other changes, such as: Feeling very  thirsty. Eyes that look hollow (sunken). Cold hands and feet. Being mixed up (confused). Being very tired (lethargic) or having trouble waking from sleep. Short-term weight loss. Loss of consciousness. How is this treated? Treatment for this condition depends on how bad it is. Treatment should start right away. Do not wait until your condition gets very bad. Very bad dehydration is an emergency. You will need to go to a hospital. Mild or worse dehydration can be treated at home. You may be asked to: Drink more fluids. Drink an oral rehydration solution (ORS). This drink helps get the right amounts of fluids and salts and minerals in the blood (electrolytes). Very bad dehydration can be treated: With fluids through an IV tube. By getting normal levels of salts and minerals in your blood. This is often done by giving salts and minerals through a tube. The tube is passed through your nose and into your stomach. By treating the root cause. Follow these instructions at home: Oral rehydration solution If told by your doctor, drink an ORS: Make an ORS. Use instructions on the package. Start by drinking small amounts, about  cup (120 mL) every 5-10 minutes. Slowly drink more  until you have had the amount that your doctor said to have. Eating and drinking     Drink enough clear fluid to keep your pee pale yellow. If you were told to drink an ORS, finish the ORS first. Then, start slowly drinking other clear fluids. Drink fluids such as: Water. Do not drink only water. Doing that can make the salt (sodium) level in your body get too low. Water from ice chips you suck on. Fruit juice that you have added water to (diluted). Low-calorie sports drinks. Eat foods that have the right amounts of salts and minerals, such as: Bananas. Oranges. Potatoes. Tomatoes. Spinach. Do not drink alcohol. Avoid: Drinks that have a lot of sugar. These include: High-calorie sports drinks. Fruit juice that  you did not add water to. Soda. Caffeine. Foods that are greasy or have a lot of fat or sugar. General instructions Take over-the-counter and prescription medicines only as told by your doctor. Do not take salt tablets. Doing that can make the salt level in your body get too high. Return to your normal activities as told by your doctor. Ask your doctor what activities are safe for you. Keep all follow-up visits as told by your doctor. This is important. Contact a doctor if: You have pain in your belly (abdomen) and the pain: Gets worse. Stays in one place. You have a rash. You have a stiff neck. You get angry or annoyed (irritable) more easily than normal. You are more tired or have a harder time waking than normal. You feel: Weak or dizzy. Very thirsty. Get help right away if you have: Any symptoms of very bad dehydration. A fever. A very bad headache. Symptoms of vomiting, such as: Your vomiting gets worse or does not go away. Your vomit has blood or green stuff in it. You cannot eat or drink without vomiting. Problems with peeing or pooping (having a bowel movement), such as: Watery poop that gets worse or does not go away. Blood in your poop (stool). This may cause poop to look black and tarry. Not peeing in 6-8 hours. Peeing only a small amount of very dark pee in 6-8 hours. Trouble breathing. Symptoms that get worse with treatment. These symptoms may be an emergency. Do not wait to see if the symptoms will go away. Get medical help right away. Call your local emergency services (911 in the [place] Do not drive yourself to the hospital. Summary Dehydration is a condition in which there is not enough water or other fluids in the body. This happens when a person loses more fluids than he or she takes in. Treatment for this condition depends on how bad it is. Treatment should be started right away. Do not wait until your condition gets very bad. Drink enough clear fluid to  keep your pee pale yellow. If you were told to drink an oral rehydration solution (ORS), finish the ORS first. Then, start slowly drinking other clear fluids. Take over-the-counter and prescription medicines only as told by your doctor. Get help right away if you have any symptoms of very bad dehydration. This information is not intended to replace advice given to you by your health care provider. Make sure you discuss any questions you have with your health care provider. Document Revised: 09/02/2018 Document Reviewed: 09/02/2018 Elsevier Patient Education  [place].

## 2021-02-13 NOTE — Progress Notes (Signed)
[place]  Telephone:([phone] [phone]  ID: Dean [name]. OB: Aug 01, 1935  MR#: [id number]  QIO#:[id number]  Patient Care Team: [name], MD as PCP - General (Family Medicine) Gery Pray, MD as Consulting Physician (Radiation Oncology) Danie Binder, MD (Inactive) as Consulting Physician (Gastroenterology) Michael Boston, MD as Consulting Physician (General Surgery)  CHIEF COMPLAINT: Multiple myeloma  INTERVAL HISTORY: Patient returns to clinic today for repeat laboratory work, further evaluation, and continuation of Darzalex.  He once again had a fall in the interim and now has bilateral hip pain.  His mobility has decreased.  He has increased weakness and fatigue.  He has no neurologic complaints.  He denies any recent fevers or illnesses.  He has a good appetite and denies weight loss.  He has no chest pain, shortness of breath, cough, or hemoptysis.  He denies any nausea, vomiting, constipation, or diarrhea.  He has no urinary complaints.  Patient offers no further specific complaints today.  REVIEW OF SYSTEMS:   Review of Systems  Constitutional:  Positive for malaise/fatigue. Negative for fever and weight loss.  Respiratory: Negative.  Negative for cough, hemoptysis and shortness of breath.   Cardiovascular: Negative.  Negative for chest pain and leg swelling.  Gastrointestinal: Negative.  Negative for abdominal pain and nausea.  Genitourinary: Negative.  Negative for dysuria.  Musculoskeletal:  Positive for joint pain. Negative for back pain and falls.  Skin: Negative.  Negative for rash.  Neurological:  Positive for sensory change and weakness. Negative for dizziness, focal weakness and headaches.  Psychiatric/Behavioral: Negative.  The patient is not nervous/anxious and does not have insomnia.    As per HPI. Otherwise, a complete review of systems is negative.  PAST MEDICAL HISTORY: Past Medical History:  Diagnosis Date    BPH (benign prostatic hyperplasia)    Colonic diverticular abscess 01/08/2015   Diverticulitis 32/44/01   complicated by abscess and required percutaneous drainage   GERD (gastroesophageal reflux disease)    H/O ETOH abuse    History of chemotherapy last done jan 2017   History of radiation therapy 01/05/13-02/10/13   45 gray to left occipital condyle region   History of radiation therapy 12/01/16-12/10/16   Parasternal nodule, chest- 24 Gy total delivered in 8 fractions, Left sacro-iliac, pelvis- 24 Gy total delivered in 8 fractions    History of radiation therapy 02/19/17-02/22/17   right temporal scalp 30 Gy in 10 fractions   Intra-abdominal abscess (HCC)    Multiple myeloma ([place]) 2015   Neuropathy    Radiation 02/21/14-03/08/14   right posterior chest wall area 30 gray   Radiation 04/19/14-05/02/14   lumbar spine 25 gray   Skull lesion    Left occipital condyle   Wrist fracture, left    x 2    PAST SURGICAL HISTORY: Past Surgical History:  Procedure Laterality Date   COLONOSCOPY N/A 04/19/2015   Procedure: COLONOSCOPY;  Surgeon: Danie Binder, MD;  Location: [facility];  Service: Endoscopy;  Laterality: N/A;  1:30 PM   CYST EXCISION  1959   tail bone   IR IMAGING GUIDED PORT INSERTION  09/25/2017    FAMILY HISTORY: Family History  Problem Relation Age of Onset   Diabetes Mother    Stroke Mother    Hypertension Father     ADVANCED DIRECTIVES (Y/N):  N  HEALTH MAINTENANCE: Social History   Tobacco Use   Smoking status: Never   Smokeless tobacco: Never  Vaping Use   Vaping  Use: Never used  Substance Use Topics   Alcohol use: No    Comment: " Not much no more"   Drug use: No     Colonoscopy:  PAP:  Bone density:  Lipid panel:  Allergies  Allergen Reactions   Codeine Other (See Comments)    Headache   Morphine And Related Other (See Comments)    Makes him feel weird   Revlimid [Lenalidomide] Other (See Comments)    "Causes me to become weak"     Current Outpatient Medications  Medication Sig Dispense Refill   acyclovir (ZOVIRAX) 400 MG tablet TAKE 1 TABLET BY MOUTH TWICE A DAY 60 tablet 6   ALPRAZolam (XANAX) 1 MG tablet TAKE 1 TABLET BY MOUTH 3 TIMES A DAY AS NEEDED FOR ANXIETY. 60 tablet 3   CALCIUM-VITAMIN D PO Take 1 tablet by mouth 2 (two) times daily.      dexamethasone (DECADRON) 4 MG tablet      fluticasone (FLONASE) 50 MCG/ACT nasal spray Place 2 sprays into both nostrils daily. 16 g 6   furosemide (LASIX) 20 MG tablet TAKE 1 TABLET BY MOUTH ONCE A DAY AS NEEDED 30 tablet 3   gabapentin (NEURONTIN) 400 MG capsule Take 1 capsule (400 mg total) by mouth 3 (three) times daily. 90 capsule 4   Homeopathic Products (THERAWORX RELIEF EX) Apply topically as needed.     ibuprofen (ADVIL) 600 MG tablet Take 1 tablet (600 mg total) by mouth every 8 (eight) hours as needed. 30 tablet 0   lidocaine-prilocaine (EMLA) cream Apply small amount over port one (1) hour prior to appointment. 30 g 0   montelukast (SINGULAIR) 10 MG tablet TAKE 1 TABLET BY MOUTH EVERY NIGHT AT BEDTIME 30 tablet 3   omeprazole (PRILOSEC) 20 MG capsule TAKE 1 CAPSULE BY MOUTH ONCE DAILY 30 capsule 5   oxybutynin (DITROPAN-XL) 10 MG 24 hr tablet TAKE 1 TABLET BY MOUTH EVERY NIGHT AT BEDTIME 30 tablet 1   Oxycodone HCl 10 MG TABS Take 1-2 tablets (10-20 mg total) by mouth 3 (three) times daily as needed. 120 tablet 0   senna-docusate (SENOKOT-S) 8.6-50 MG tablet Take 1 tablet by mouth 2 (two) times daily. 60 tablet 5   tamsulosin (FLOMAX) 0.4 MG CAPS capsule TAKE ONE CAPSULE BY MOUTH EVERY NIGHT ATBEDTIME 30 capsule 11   traZODone (DESYREL) 100 MG tablet TAKE 3 TABLETS BY MOUTH AT BEDTIME 90 tablet 1   triamcinolone cream (KENALOG) 0.1 % APPLY 1 APPLICATION TOPICALLY TWICE A DAY 30 g 0   TURMERIC PO Take by mouth.     zolendronic acid (ZOMETA) 4 MG/5ML injection Inject 4 mg into the vein every 8 (eight) weeks.     silver sulfADIAZINE (SILVADENE) 1 % cream Apply 1  application topically daily. (Patient not taking: Reported on 02/13/2021) 50 g 0   No current facility-administered medications for this visit.   Facility-Administered Medications Ordered in Other Visits  Medication Dose Route Frequency Provider Last Rate Last Admin   heparin lock flush 100 unit/mL  500 Units Intravenous Once Lloyd Huger, MD       heparin lock flush 100 unit/mL  500 Units Intracatheter Once PRN Lloyd Huger, MD        OBJECTIVE: Vitals:   02/13/21 0902 02/13/21 0908  BP: (!) 93/50 (!) 96/58  Pulse: (!) 52   Resp: 18   Temp: 97.6 F (36.4 C)   SpO2: 97%       Body mass index is 26.82  kg/m.    ECOG FS:1 - Symptomatic but completely ambulatory  General: Well-developed, well-nourished, no acute distress.  Sitting in a wheelchair. Eyes: Pink conjunctiva, anicteric sclera. HEENT: Normocephalic, moist mucous membranes. Lungs: No audible wheezing or coughing. Heart: Regular rate and rhythm. Abdomen: Soft, nontender, no obvious distention. Musculoskeletal: No edema, cyanosis, or clubbing. Neuro: Alert, answering all questions appropriately. Cranial nerves grossly intact. Skin: No rashes or petechiae noted. Psych: Normal affect.   LAB RESULTS:  Lab Results  Component Value Date   NA 137 02/13/2021   K 3.8 02/13/2021   CL 104 02/13/2021   CO2 29 02/13/2021   GLUCOSE 89 02/13/2021   BUN 23 02/13/2021   CREATININE 0.63 02/13/2021   CALCIUM 8.3 (L) 02/13/2021   PROT 5.7 (L) 02/13/2021   ALBUMIN 3.4 (L) 02/13/2021   AST 14 (L) 02/13/2021   ALT 10 02/13/2021   ALKPHOS 20 (L) 02/13/2021   BILITOT 0.7 02/13/2021   GFRNONAA >60 02/13/2021   GFRAA >60 10/13/2019    Lab Results  Component Value Date   WBC 4.7 02/13/2021   NEUTROABS 3.0 02/13/2021   HGB 12.2 (L) 02/13/2021   HCT 37.5 (L) 02/13/2021   MCV 94.5 02/13/2021   PLT 112 (L) 02/13/2021     STUDIES: DG HIP UNILAT WITH PELVIS 2-3 VIEWS RIGHT  Result Date: 01/16/2021 CLINICAL  DATA:  Right hip pain radiating across the lower back. EXAM: DG HIP (WITH OR WITHOUT PELVIS) 2-3V RIGHT COMPARISON:  Right hip plain films, dated May 23, 2020 and pelvis MRI dated Jun 06, 2020 FINDINGS: There is mild flattening of the right femoral head. This is mildly increased in severity when compared to the prior study. There is no evidence of dislocation. Marked severity degenerative changes are seen in the form of joint space narrowing and acetabular sclerosis. IMPRESSION: Mild flattening of the right femoral head with marked severity degenerative changes of the right hip. This corresponds to findings consistent with a healing subchondral fracture of the right femoral head seen on the pelvis MRI. Electronically Signed   By: Virgina Norfolk M.D.   On: 01/16/2021 20:28    ASSESSMENT: Multiple myeloma  PLAN:  Multiple myeloma: Please see note from September 17, 2020 for historic details of his myeloma and treatment.  His most recent M spike is essentially unchanged at 0.4, but has trended up slightly over the last 3 months when it was 0.2.  Immunoglobulins continue to be within normal limits and kappa free light chains are chronic and unchanged at 41.7.  He is currently receiving maintenance Darzalex every 4 weeks and Zometa every 8 weeks on the odd-numbered cycles.  Given patient's declining performance status and hypotension, will hold treatment at this time.  Patient will instead receive 1 L of IV fluids.  Return to clinic in 1 week for repeat laboratory work, further evaluation, and additional fluids if needed. Thrombocytopenia: Chronic and unchanged.  Patient's platelet count is 112 today.   Back/joint pain: Now bilateral.  Continue oxycodone as prescribed.  Recent x-ray did not show any fracture.  Consider repeat imaging and referral to orthopedics if needed. Peripheral neuropathy: Chronic and unchanged.  Continue gabapentin as prescribed. Insomnia/anxiety: Chronic and unchanged.  Continue Xanax  and trazodone as needed. Hypotension: IV fluids as above.   Patient expressed understanding and was in agreement with this plan. He also understands that He can call clinic at any time with any questions, concerns, or complaints.    Cancer Staging  No matching staging information  was found for the patient.  Lloyd Huger, MD   02/14/2021 9:32 AM

## 2021-02-13 NOTE — Progress Notes (Signed)
Pt in for follow up, states fell twice at home last week and having pain in both hips. Pt reports feels very weak this morning.

## 2021-02-13 NOTE — Progress Notes (Signed)
Nutrition Follow-up:  Patient with multiple myeloma.  Patient's treatment on hold today, receiving fluids  Met with patient during infusion. Patient says that his appetite has been decreased.  His daughter brings him food but he says he can't eat it all.  Has not been drinking carnation breakfast essentials due to his wife hiding the box.  His wife has dementia.  Says that he is not drinking enough.      Medications: reviewed  Labs: reviewed  Anthropometrics:   Weight 176 lb 6.4 oz on 1/11  175 lb on 12/13  182 lb 12.8 oz on 11/15 184 lb on 10/11 172 lb on 9/28 178 lb on 9/12 179 lb on 8/30  NUTRITION DIAGNOSIS:  Inadequate oral intake continues   INTERVENTION:  Patient says that he has a thermos/mug (16-20oz) that he likes to drink out of and RD discussed him trying to drink 3-4 of these daily Recommend patient start back carnation breakfast drink.  He said he found the box of shakes.      MONITORING, EVALUATION, GOAL: weight trends, intake   NEXT VISIT: Tuesday, Feb 7 during infusion  Francenia Chimenti B. Zenia Resides, [place], [place] Registered Dietitian [phone] (mobile)

## 2021-02-14 ENCOUNTER — Encounter (HOSPITAL_COMMUNITY): Payer: Self-pay | Admitting: Hematology

## 2021-02-14 LAB — KAPPA/LAMBDA LIGHT CHAINS
Kappa free light chain: 44.5 mg/L — ABNORMAL HIGH (ref 3.3–19.4)
Kappa, lambda light chain ratio: 7.81 — ABNORMAL HIGH (ref 0.26–1.65)
Lambda free light chains: 5.7 mg/L (ref 5.7–26.3)

## 2021-02-14 LAB — IGG, IGA, IGM
IgA: 22 mg/dL — ABNORMAL LOW (ref 61–437)
IgG (Immunoglobin G), Serum: 782 mg/dL (ref 603–1613)
IgM (Immunoglobulin M), Srm: 16 mg/dL (ref 15–143)

## 2021-02-15 LAB — PROTEIN ELECTROPHORESIS, SERUM
A/G Ratio: 1.1 (ref 0.7–1.7)
Albumin ELP: 2.7 g/dL — ABNORMAL LOW (ref 2.9–4.4)
Alpha-1-Globulin: 0.3 g/dL (ref 0.0–0.4)
Alpha-2-Globulin: 0.6 g/dL (ref 0.4–1.0)
Beta Globulin: 0.8 g/dL (ref 0.7–1.3)
Gamma Globulin: 0.8 g/dL (ref 0.4–1.8)
Globulin, Total: 2.5 g/dL (ref 2.2–3.9)
M-Spike, %: 0.5 g/dL — ABNORMAL HIGH
Total Protein ELP: 5.2 g/dL — ABNORMAL LOW (ref 6.0–8.5)

## 2021-02-19 NOTE — Progress Notes (Signed)
[place]  Telephone:([phone] [phone]  ID: Dean [name]. OB: 1935-09-18  MR#: [id number]  TWK#:[id number]  Patient Care Team: [name], MD as PCP - General (Family Medicine) Gery Pray, MD as Consulting Physician (Radiation Oncology) Danie Binder, MD (Inactive) as Consulting Physician (Gastroenterology) Michael Boston, MD as Consulting Physician (General Surgery)  CHIEF COMPLAINT: Multiple myeloma  INTERVAL HISTORY: Patient returns to clinic today for further evaluation and additional IV fluids.  Despite normal blood pressure and adequate fluid intake, patient had another fall this past week.  He did not seek medical attention.  He continues to have chronic weakness and fatigue.  He has no neurologic complaints.  He denies any recent fevers or illnesses.  He has a good appetite and denies weight loss.  He has no chest pain, shortness of breath, cough, or hemoptysis.  He denies any nausea, vomiting, constipation, or diarrhea.  He has no urinary complaints.  Patient offers no further specific complaints today.  REVIEW OF SYSTEMS:   Review of Systems  Constitutional:  Positive for malaise/fatigue. Negative for fever and weight loss.  Respiratory: Negative.  Negative for cough, hemoptysis and shortness of breath.   Cardiovascular: Negative.  Negative for chest pain and leg swelling.  Gastrointestinal: Negative.  Negative for abdominal pain and nausea.  Genitourinary: Negative.  Negative for dysuria.  Musculoskeletal:  Positive for falls and joint pain. Negative for back pain.  Skin: Negative.  Negative for rash.  Neurological:  Positive for sensory change and weakness. Negative for dizziness, focal weakness and headaches.  Psychiatric/Behavioral: Negative.  The patient is not nervous/anxious and does not have insomnia.    As per HPI. Otherwise, a complete review of systems is negative.  PAST MEDICAL HISTORY: Past Medical History:   Diagnosis Date   BPH (benign prostatic hyperplasia)    Colonic diverticular abscess 01/08/2015   Diverticulitis 71/16/57   complicated by abscess and required percutaneous drainage   GERD (gastroesophageal reflux disease)    H/O ETOH abuse    History of chemotherapy last done jan 2017   History of radiation therapy 01/05/13-02/10/13   45 gray to left occipital condyle region   History of radiation therapy 12/01/16-12/10/16   Parasternal nodule, chest- 24 Gy total delivered in 8 fractions, Left sacro-iliac, pelvis- 24 Gy total delivered in 8 fractions    History of radiation therapy 02/19/17-02/22/17   right temporal scalp 30 Gy in 10 fractions   Intra-abdominal abscess ([facility]CC)    Multiple myeloma ([place]) 2015   Neuropathy    Radiation 02/21/14-03/08/14   right posterior chest wall area 30 gray   Radiation 04/19/14-05/02/14   lumbar spine 25 gray   Skull lesion    Left occipital condyle   Wrist fracture, left    x 2    PAST SURGICAL HISTORY: Past Surgical History:  Procedure Laterality Date   COLONOSCOPY N/A 04/19/2015   Procedure: COLONOSCOPY;  Surgeon: Danie Binder, MD;  Location: [facility];  Service: Endoscopy;  Laterality: N/A;  1:30 PM   CYST EXCISION  1959   tail bone   IR IMAGING GUIDED PORT INSERTION  09/25/2017    FAMILY HISTORY: Family History  Problem Relation Age of Onset   Diabetes Mother    Stroke Mother    Hypertension Father     ADVANCED DIRECTIVES (Y/N):  N  HEALTH MAINTENANCE: Social History   Tobacco Use   Smoking status: Never   Smokeless tobacco: Never  Vaping Use  Vaping Use: Never used  Substance Use Topics   Alcohol use: No    Comment: " Not much no more"   Drug use: No     Colonoscopy:  PAP:  Bone density:  Lipid panel:  Allergies  Allergen Reactions   Codeine Other (See Comments)    Headache   Morphine And Related Other (See Comments)    Makes him feel weird   Revlimid [Lenalidomide] Other (See Comments)    "Causes me to  become weak"    Current Outpatient Medications  Medication Sig Dispense Refill   acyclovir (ZOVIRAX) 400 MG tablet TAKE 1 TABLET BY MOUTH TWICE A DAY 60 tablet 6   ALPRAZolam (XANAX) 1 MG tablet TAKE 1 TABLET BY MOUTH 3 TIMES A DAY AS NEEDED FOR ANXIETY. 60 tablet 3   CALCIUM-VITAMIN D PO Take 1 tablet by mouth 2 (two) times daily.      dexamethasone (DECADRON) 4 MG tablet      fluticasone (FLONASE) 50 MCG/ACT nasal spray Place 2 sprays into both nostrils daily. 16 g 6   furosemide (LASIX) 20 MG tablet TAKE 1 TABLET BY MOUTH ONCE A DAY AS NEEDED 30 tablet 3   gabapentin (NEURONTIN) 400 MG capsule Take 1 capsule (400 mg total) by mouth 3 (three) times daily. 90 capsule 4   Homeopathic Products (THERAWORX RELIEF EX) Apply topically as needed.     ibuprofen (ADVIL) 600 MG tablet Take 1 tablet (600 mg total) by mouth every 8 (eight) hours as needed. 30 tablet 0   lidocaine-prilocaine (EMLA) cream Apply small amount over port one (1) hour prior to appointment. 30 g 0   montelukast (SINGULAIR) 10 MG tablet TAKE 1 TABLET BY MOUTH EVERY NIGHT AT BEDTIME 30 tablet 3   omeprazole (PRILOSEC) 20 MG capsule TAKE 1 CAPSULE BY MOUTH ONCE DAILY 30 capsule 5   oxybutynin (DITROPAN-XL) 10 MG 24 hr tablet TAKE 1 TABLET BY MOUTH EVERY NIGHT AT BEDTIME 30 tablet 1   Oxycodone HCl 10 MG TABS Take 1-2 tablets (10-20 mg total) by mouth 3 (three) times daily as needed. 120 tablet 0   senna-docusate (SENOKOT-S) 8.6-50 MG tablet Take 1 tablet by mouth 2 (two) times daily. 60 tablet 5   tamsulosin (FLOMAX) 0.4 MG CAPS capsule TAKE ONE CAPSULE BY MOUTH EVERY NIGHT ATBEDTIME 30 capsule 11   traZODone (DESYREL) 100 MG tablet TAKE 3 TABLETS BY MOUTH AT BEDTIME 90 tablet 1   triamcinolone cream (KENALOG) 0.1 % APPLY 1 APPLICATION TOPICALLY TWICE A DAY 30 g 0   TURMERIC PO Take by mouth.     zolendronic acid (ZOMETA) 4 MG/5ML injection Inject 4 mg into the vein every 8 (eight) weeks.     silver sulfADIAZINE (SILVADENE) 1 %  cream Apply 1 application topically daily. (Patient not taking: Reported on 02/13/2021) 50 g 0   No current facility-administered medications for this visit.   Facility-Administered Medications Ordered in Other Visits  Medication Dose Route Frequency Provider Last Rate Last Admin   heparin lock flush 100 unit/mL  500 Units Intravenous Once Lloyd Huger, MD       heparin lock flush 100 unit/mL  500 Units Intracatheter Once PRN Lloyd Huger, MD        OBJECTIVE: Vitals:   02/20/21 0922  BP: (!) 112/54  Pulse: (!) 53  Resp: 18  Temp: 97.8 F (36.6 C)  SpO2: 98%      Body mass index is 26.61 kg/m.    ECOG FS:1 -  Symptomatic but completely ambulatory  General: Well-developed, well-nourished, no acute distress. Eyes: Pink conjunctiva, anicteric sclera. HEENT: Normocephalic, moist mucous membranes. Lungs: No audible wheezing or coughing. Heart: Regular rate and rhythm. Abdomen: Soft, nontender, no obvious distention. Musculoskeletal: No edema, cyanosis, or clubbing. Neuro: Alert, answering all questions appropriately. Cranial nerves grossly intact. Skin: No rashes or petechiae noted. Psych: Normal affect.  LAB RESULTS:  Lab Results  Component Value Date   NA 139 02/20/2021   K 3.8 02/20/2021   CL 104 02/20/2021   CO2 29 02/20/2021   GLUCOSE 121 (H) 02/20/2021   BUN 19 02/20/2021   CREATININE 0.67 02/20/2021   CALCIUM 8.6 (L) 02/20/2021   PROT 5.8 (L) 02/20/2021   ALBUMIN 3.5 02/20/2021   AST 24 02/20/2021   ALT 11 02/20/2021   ALKPHOS 20 (L) 02/20/2021   BILITOT 0.6 02/20/2021   GFRNONAA >60 02/20/2021   GFRAA >60 10/13/2019    Lab Results  Component Value Date   WBC 5.8 02/20/2021   NEUTROABS 3.6 02/20/2021   HGB 12.4 (L) 02/20/2021   HCT 37.6 (L) 02/20/2021   MCV 93.1 02/20/2021   PLT 120 (L) 02/20/2021     STUDIES: No results found.  ASSESSMENT: Multiple myeloma  PLAN:  Multiple myeloma: Please see note from September 17, 2020 for  historic details of his myeloma and treatment.  His most recent M spike has increased slightly to 0.5.  This has trended up slightly over the past several months when it was 0.2.  Immunoglobulins continue to be within normal limits and kappa free light chains are chronic and unchanged at 44.5.  He is currently receiving maintenance Darzalex every 4 weeks and Zometa every 8 weeks on the odd-numbered cycles.  Given patient's decreased performance status and repeated falls, will hold treatment at this time.  Patient expressed understanding that he may have to reinitiate in the near future.  Return to clinic in 3 weeks for repeat laboratory work and further evaluation.   Thrombocytopenia: Chronic and unchanged.  Patient's platelet count is 120 today. Back/joint pain: Chronic and unchanged.  Continue oxycodone as prescribed.  Recent x-ray did not show any fracture.  Consider repeat imaging and referral to orthopedics if needed. Peripheral neuropathy: Chronic and unchanged.  Continue gabapentin as prescribed. Insomnia/anxiety: Chronic and unchanged.  Continue Xanax and trazodone as needed. Hypotension: Significantly improved.  Proceed with 1 L IV fluids today. Falls: Patient was evaluated by Occupational Therapy today.   Patient expressed understanding and was in agreement with this plan. He also understands that He can call clinic at any time with any questions, concerns, or complaints.    Cancer Staging  No matching staging information was found for the patient.  Lloyd Huger, MD   02/21/2021 6:20 AM

## 2021-02-20 ENCOUNTER — Inpatient Hospital Stay: Payer: Medicare Other

## 2021-02-20 ENCOUNTER — Inpatient Hospital Stay: Payer: Medicare Other | Admitting: Occupational Therapy

## 2021-02-20 ENCOUNTER — Other Ambulatory Visit: Payer: Self-pay | Admitting: Emergency Medicine

## 2021-02-20 ENCOUNTER — Other Ambulatory Visit: Payer: Self-pay

## 2021-02-20 ENCOUNTER — Inpatient Hospital Stay (HOSPITAL_BASED_OUTPATIENT_CLINIC_OR_DEPARTMENT_OTHER): Payer: Medicare Other | Admitting: Oncology

## 2021-02-20 VITALS — BP 112/54 | HR 53 | Temp 97.8°F | Resp 18 | Wt 175.0 lb

## 2021-02-20 DIAGNOSIS — C9000 Multiple myeloma not having achieved remission: Secondary | ICD-10-CM

## 2021-02-20 DIAGNOSIS — I959 Hypotension, unspecified: Secondary | ICD-10-CM | POA: Diagnosis not present

## 2021-02-20 DIAGNOSIS — M6281 Muscle weakness (generalized): Secondary | ICD-10-CM

## 2021-02-20 DIAGNOSIS — R5383 Other fatigue: Secondary | ICD-10-CM | POA: Diagnosis not present

## 2021-02-20 DIAGNOSIS — R531 Weakness: Secondary | ICD-10-CM | POA: Diagnosis not present

## 2021-02-20 LAB — COMPREHENSIVE METABOLIC PANEL
ALT: 11 U/L (ref 0–44)
AST: 24 U/L (ref 15–41)
Albumin: 3.5 g/dL (ref 3.5–5.0)
Alkaline Phosphatase: 20 U/L — ABNORMAL LOW (ref 38–126)
Anion gap: 6 (ref 5–15)
BUN: 19 mg/dL (ref 8–23)
CO2: 29 mmol/L (ref 22–32)
Calcium: 8.6 mg/dL — ABNORMAL LOW (ref 8.9–10.3)
Chloride: 104 mmol/L (ref 98–111)
Creatinine, Ser: 0.67 mg/dL (ref 0.61–1.24)
GFR, Estimated: >60 mL/min (ref >60)
Glucose, Bld: 121 mg/dL — ABNORMAL HIGH (ref 70–99)
Potassium: 3.8 mmol/L (ref 3.5–5.1)
Sodium: 139 mmol/L (ref 135–145)
Total Bilirubin: 0.6 mg/dL (ref 0.3–1.2)
Total Protein: 5.8 g/dL — ABNORMAL LOW (ref 6.5–8.1)

## 2021-02-20 LAB — CBC WITH DIFFERENTIAL/PLATELET
Abs Immature Granulocytes: 0.02 10*3/uL (ref 0.00–0.07)
Basophils Absolute: 0 10*3/uL (ref 0.0–0.1)
Basophils Relative: 0 %
Eosinophils Absolute: 0 10*3/uL (ref 0.0–0.5)
Eosinophils Relative: 0 %
HCT: 37.6 % — ABNORMAL LOW (ref 39.0–52.0)
Hemoglobin: 12.4 g/dL — ABNORMAL LOW (ref 13.0–17.0)
Immature Granulocytes: 0 %
Lymphocytes Relative: 28 %
Lymphs Abs: 1.6 10*3/uL (ref 0.7–4.0)
MCH: 30.7 pg (ref 26.0–34.0)
MCHC: 33 g/dL (ref 30.0–36.0)
MCV: 93.1 fL (ref 80.0–100.0)
Monocytes Absolute: 0.5 10*3/uL (ref 0.1–1.0)
Monocytes Relative: 9 %
Neutro Abs: 3.6 10*3/uL (ref 1.7–7.7)
Neutrophils Relative %: 63 %
Platelets: 120 10*3/uL — ABNORMAL LOW (ref 150–400)
RBC: 4.04 MIL/uL — ABNORMAL LOW (ref 4.22–5.81)
RDW: 13.3 % (ref 11.5–15.5)
WBC: 5.8 10*3/uL (ref 4.0–10.5)
nRBC: 0 % (ref 0.0–0.2)

## 2021-02-20 LAB — MAGNESIUM: Magnesium: 2 mg/dL (ref 1.7–2.4)

## 2021-02-20 MED ORDER — SODIUM CHLORIDE 0.9% FLUSH
10.0000 mL | Freq: Once | INTRAVENOUS | Status: AC | PRN
  Administered 2021-02-20: 10 mL
  Filled 2021-02-20: qty 10

## 2021-02-20 MED ORDER — HEPARIN SOD (PORK) LOCK FLUSH 100 UNIT/ML IV SOLN
500.0000 [IU] | Freq: Once | INTRAVENOUS | Status: AC | PRN
  Administered 2021-02-20: 500 [IU]
  Filled 2021-02-20: qty 5

## 2021-02-20 MED ORDER — SODIUM CHLORIDE 0.9 % IV SOLN
Freq: Once | INTRAVENOUS | Status: AC
  Filled 2021-02-20: qty 250

## 2021-02-20 NOTE — Therapy (Signed)
[facility] at [facility] [place], [place], [place], [address] Phone: [phone]   Fax:  [phone]  Occupational Therapy Screen:  Patient Details  Name: Dean [name]. MRN: [id number] Date of Birth: 1935/03/16 No data recorded  Encounter Date: 02/20/2021   OT End of Session - 02/20/21 1906     Visit Number 0             Past Medical History:  Diagnosis Date   BPH (benign prostatic hyperplasia)    Colonic diverticular abscess 01/08/2015   Diverticulitis 81/15/72   complicated by abscess and required percutaneous drainage   GERD (gastroesophageal reflux disease)    H/O ETOH abuse    History of chemotherapy last done jan 2017   History of radiation therapy 01/05/13-02/10/13   45 gray to left occipital condyle region   History of radiation therapy 12/01/16-12/10/16   Parasternal nodule, chest- 24 Gy total delivered in 8 fractions, Left sacro-iliac, pelvis- 24 Gy total delivered in 8 fractions    History of radiation therapy 02/19/17-02/22/17   right temporal scalp 30 Gy in 10 fractions   Intra-abdominal abscess (HCC)    Multiple myeloma ([place]) 2015   Neuropathy    Radiation 02/21/14-03/08/14   right posterior chest wall area 30 gray   Radiation 04/19/14-05/02/14   lumbar spine 25 gray   Skull lesion    Left occipital condyle   Wrist fracture, left    x 2    Past Surgical History:  Procedure Laterality Date   COLONOSCOPY N/A 04/19/2015   Procedure: COLONOSCOPY;  Surgeon: Danie Binder, MD;  Location: [facility];  Service: Endoscopy;  Laterality: N/A;  1:30 PM   CYST EXCISION  1959   tail bone   IR IMAGING GUIDED PORT INSERTION  09/25/2017    There were no vitals filed for this visit.   Subjective Assessment - 02/20/21 1904     Subjective  I had about  7 falls the last 6 months- the last one last week - my R side of upper leg , hip and lower back hurting - and my elbow has knot and head - I use the cane. I feel  better with the walker but I cannot walk with it in the house to much clutter.    Currently in Pain? Yes    Pain Score 7     Pain Location Leg    Pain Orientation Right    Pain Descriptors / Indicators Aching;Tender    Pain Type Acute pain    Pain Onset 1 to 4 weeks ago               Lloyd Huger, MD   02/20/2021   ASSESSMENT: Multiple myeloma   PLAN:   Multiple myeloma: Please see note from September 17, 2020 for historic details of his myeloma and treatment.  His most recent M spike is essentially unchanged at 0.4, but has trended up slightly over the last 3 months when it was 0.2.  Immunoglobulins continue to be within normal limits and kappa free light chains are chronic and unchanged at 41.7.  He is currently receiving maintenance Darzalex every 4 weeks and Zometa every 8 weeks on the odd-numbered cycles.  Given patient's declining performance status and hypotension, will hold treatment at this time.  Patient will instead receive 1 L of IV fluids.  Return to clinic in 1 week for repeat laboratory work, further evaluation, and additional fluids if needed.  Thrombocytopenia: Chronic and unchanged.  Patient's platelet count is 112 today.   Back/joint pain: Now bilateral.  Continue oxycodone as prescribed.  Recent x-ray did not show any fracture.  Consider repeat imaging and referral to orthopedics if needed. Peripheral neuropathy: Chronic and unchanged.  Continue gabapentin as prescribed. Insomnia/anxiety: Chronic and unchanged.  Continue Xanax and trazodone as needed. Hypotension: IV fluids as above.  OT SCREEN: 02/20/21 Pt refer by Dr Grayland Ormond for frequent falls and increase weakness. Pt report having last fall last week and R lateral upper leg, hip and lower back hurting. Pt with SPC in clinic today.  He report living in trailer with his woman friend of more than 20 yrs. She has dementia and a lot of clutter in the house- making it not able to use his walker in the house- and  hard time going from room to room and to walk in shower. Pt show decrease strength in R hip.  BERG Balance test pt score 36/56 putting him at med risk for falling- pt need to use hands to push up, and for transfers, need S with standing close eyes and feet together , as well as picking up something off floor. Unable to stand on one leg or alternating putting feet on stool. 10 meter walk - pt took 18 sec with and without SPC Report neuropathy in both feet and goes up to his knees at times.- On Gabapentin. Would recommend  for pt to get [facility]  PT and OT -  for safety at home and increase strength, balance and independence. Pt to follow up with me as needed                                     Patient will benefit from skilled therapeutic intervention in order to improve the following deficits and impairments in [facility] setting           Visit Diagnosis: Muscle weakness (generalized)    Problem List Patient Active Problem List   Diagnosis Date Noted   Spinal stenosis, lumbar 10/13/2016   Chronic back pain 10/01/2016   DDD (degenerative disc disease), lumbar 10/01/2016   OA (osteoarthritis) of knee 10/01/2016   Diverticulitis s/p robotic sigmoid colectomy 07/11/2015 07/11/2015   Goals of care, counseling/discussion    Abnormal CT scan, sigmoid colon 04/02/2015   Abnormal PET scan of colon 04/02/2015   Thrombocytopenia ([place]) 01/09/2015   Multiple myeloma without remission ([place]) 02/15/2014   Allergic rhinitis 01/16/2014   History of vertebral stress fracture 01/16/2014   Dermatitis 01/16/2014   Loss of weight 09/05/2013   Insomnia 09/05/2013   Fatigue 05/16/2013   Neck pain 12/07/2012   Skull lesion 11/29/2012   Neuropathy ([place])    BPH (benign prostatic hyperplasia)    GERD (gastroesophageal reflux disease)    H/O ETOH abuse     Rosalyn Gess, OTR/L,CLT 02/20/2021, 7:06 PM  [facility] at [facility] [address], [place], [place], [address] Phone: [phone]   Fax:  [phone]  Name: Dean [name]. MRN: [id number] Date of Birth: 09/10/1935

## 2021-02-20 NOTE — Progress Notes (Signed)
Patient received 1 L NS today per MD orders. VSS. Discharged to home.

## 2021-02-20 NOTE — Progress Notes (Signed)
Pt fell last week. C/o abrasion to left elbow, knot on his head, and being sore. Denies LOC. Pt also reports blood in urine last night, lower back pain, burning and difficulty while urinating. Burning sensation he says has been ongoing for months.

## 2021-02-21 ENCOUNTER — Encounter (HOSPITAL_COMMUNITY): Payer: Self-pay | Admitting: Hematology

## 2021-02-22 DIAGNOSIS — G629 Polyneuropathy, unspecified: Secondary | ICD-10-CM | POA: Diagnosis not present

## 2021-02-22 DIAGNOSIS — M549 Dorsalgia, unspecified: Secondary | ICD-10-CM | POA: Diagnosis not present

## 2021-02-22 DIAGNOSIS — N400 Enlarged prostate without lower urinary tract symptoms: Secondary | ICD-10-CM | POA: Diagnosis not present

## 2021-02-22 DIAGNOSIS — C9000 Multiple myeloma not having achieved remission: Secondary | ICD-10-CM | POA: Diagnosis not present

## 2021-02-22 DIAGNOSIS — M25552 Pain in left hip: Secondary | ICD-10-CM | POA: Diagnosis not present

## 2021-02-22 DIAGNOSIS — F419 Anxiety disorder, unspecified: Secondary | ICD-10-CM | POA: Diagnosis not present

## 2021-02-22 DIAGNOSIS — Z9181 History of falling: Secondary | ICD-10-CM | POA: Diagnosis not present

## 2021-02-22 DIAGNOSIS — K219 Gastro-esophageal reflux disease without esophagitis: Secondary | ICD-10-CM | POA: Diagnosis not present

## 2021-02-22 DIAGNOSIS — Z79891 Long term (current) use of opiate analgesic: Secondary | ICD-10-CM | POA: Diagnosis not present

## 2021-02-22 DIAGNOSIS — I959 Hypotension, unspecified: Secondary | ICD-10-CM | POA: Diagnosis not present

## 2021-02-22 DIAGNOSIS — G4700 Insomnia, unspecified: Secondary | ICD-10-CM | POA: Diagnosis not present

## 2021-02-22 DIAGNOSIS — D696 Thrombocytopenia, unspecified: Secondary | ICD-10-CM | POA: Diagnosis not present

## 2021-02-22 DIAGNOSIS — K5790 Diverticulosis of intestine, part unspecified, without perforation or abscess without bleeding: Secondary | ICD-10-CM | POA: Diagnosis not present

## 2021-02-22 DIAGNOSIS — M25551 Pain in right hip: Secondary | ICD-10-CM | POA: Diagnosis not present

## 2021-02-22 DIAGNOSIS — R296 Repeated falls: Secondary | ICD-10-CM | POA: Diagnosis not present

## 2021-02-26 DIAGNOSIS — R296 Repeated falls: Secondary | ICD-10-CM | POA: Diagnosis not present

## 2021-02-26 DIAGNOSIS — D696 Thrombocytopenia, unspecified: Secondary | ICD-10-CM | POA: Diagnosis not present

## 2021-02-26 DIAGNOSIS — M549 Dorsalgia, unspecified: Secondary | ICD-10-CM | POA: Diagnosis not present

## 2021-02-26 DIAGNOSIS — M25552 Pain in left hip: Secondary | ICD-10-CM | POA: Diagnosis not present

## 2021-02-26 DIAGNOSIS — C9000 Multiple myeloma not having achieved remission: Secondary | ICD-10-CM | POA: Diagnosis not present

## 2021-02-26 DIAGNOSIS — M25551 Pain in right hip: Secondary | ICD-10-CM | POA: Diagnosis not present

## 2021-02-28 ENCOUNTER — Ambulatory Visit: Payer: Medicare Other | Admitting: Nurse Practitioner

## 2021-02-28 ENCOUNTER — Telehealth: Payer: Self-pay

## 2021-02-28 DIAGNOSIS — D696 Thrombocytopenia, unspecified: Secondary | ICD-10-CM | POA: Diagnosis not present

## 2021-02-28 DIAGNOSIS — M25552 Pain in left hip: Secondary | ICD-10-CM | POA: Diagnosis not present

## 2021-02-28 DIAGNOSIS — R296 Repeated falls: Secondary | ICD-10-CM | POA: Diagnosis not present

## 2021-02-28 DIAGNOSIS — C9000 Multiple myeloma not having achieved remission: Secondary | ICD-10-CM | POA: Diagnosis not present

## 2021-02-28 DIAGNOSIS — M549 Dorsalgia, unspecified: Secondary | ICD-10-CM | POA: Diagnosis not present

## 2021-02-28 DIAGNOSIS — M25551 Pain in right hip: Secondary | ICD-10-CM | POA: Diagnosis not present

## 2021-02-28 NOTE — Telephone Encounter (Signed)
Annette w/[facility] called to report she had visit with pt today and he has reported 2 falls last night. Pt c/o pain to his left leg and arm, denies head injury or LOC. Pt also reports frequent urination and dysuria. She states pt does have OV scheduled for tomorrow. She is asking for orders for Skilled Nursing.  Also received call from [place] w/[facility] [phone]). She also reports pt falls and same pain to L leg/arm. She states pt does not feel like anything is broken. She is asking for orders for shower chair and 3n1 commode. She is also aware pt has OV tomorrow.  LVM for Anne Ng to return call to provider verbal orders

## 2021-03-01 ENCOUNTER — Encounter: Payer: Self-pay | Admitting: Nurse Practitioner

## 2021-03-01 ENCOUNTER — Other Ambulatory Visit: Payer: Self-pay

## 2021-03-01 ENCOUNTER — Other Ambulatory Visit (HOSPITAL_COMMUNITY): Payer: Self-pay | Admitting: Oncology

## 2021-03-01 ENCOUNTER — Ambulatory Visit (INDEPENDENT_AMBULATORY_CARE_PROVIDER_SITE_OTHER): Payer: Medicare Other | Admitting: Nurse Practitioner

## 2021-03-01 ENCOUNTER — Other Ambulatory Visit: Payer: Self-pay | Admitting: Oncology

## 2021-03-01 VITALS — BP 102/84 | HR 80 | Ht 68.0 in | Wt 181.0 lb

## 2021-03-01 DIAGNOSIS — I9589 Other hypotension: Secondary | ICD-10-CM

## 2021-03-01 DIAGNOSIS — E861 Hypovolemia: Secondary | ICD-10-CM

## 2021-03-01 DIAGNOSIS — C9000 Multiple myeloma not having achieved remission: Secondary | ICD-10-CM

## 2021-03-01 DIAGNOSIS — G629 Polyneuropathy, unspecified: Secondary | ICD-10-CM

## 2021-03-01 DIAGNOSIS — N3000 Acute cystitis without hematuria: Secondary | ICD-10-CM

## 2021-03-01 DIAGNOSIS — R300 Dysuria: Secondary | ICD-10-CM | POA: Diagnosis not present

## 2021-03-01 LAB — URINALYSIS, ROUTINE W REFLEX MICROSCOPIC
Bilirubin Urine: NEGATIVE
Glucose, UA: NEGATIVE
Hyaline Cast: NONE SEEN /LPF
Ketones, ur: NEGATIVE
Nitrite: POSITIVE — AB
Specific Gravity, Urine: 1.025 (ref 1.001–1.035)
Squamous Epithelial / HPF: NONE SEEN /HPF (ref <=5)
pH: 6 (ref 5.0–8.0)

## 2021-03-01 LAB — MICROSCOPIC MESSAGE

## 2021-03-01 MED ORDER — CIPROFLOXACIN HCL 500 MG PO TABS: 500.0000 mg | ORAL_TABLET | Freq: Two times a day (BID) | ORAL | 0 refills | Status: DC

## 2021-03-01 MED ORDER — CEFTRIAXONE SODIUM 1 G IJ SOLR
1.0000 g | Freq: Once | INTRAMUSCULAR | Status: AC
  Administered 2021-03-01: 1 g via INTRAMUSCULAR

## 2021-03-01 MED ORDER — SODIUM CHLORIDE 0.9 % IV SOLN: Freq: Once | INTRAVENOUS | Status: DC

## 2021-03-01 NOTE — Progress Notes (Signed)
° ° °Subjective:  ° ° Patient ID: Dean G Mcguire Jr., male    DOB: 02/05/1935, 85 y.o.   MRN: [id number] ° °HPI: °Dean G Santoro Jr. is a 85 y.o. male presenting for urinary symptoms.  ° °Chief Complaint  °Patient presents with  ° Fall  ° °URINARY SYMPTOMS °Duration: "pretty good while" - would not give me a number of days °Dysuria: burning °Urinary frequency: yes °Urgency: yes °Urinary incontinence: yes °Foul odor: yes °Hematuria: no °Abdominal pain: no °Back pain: yes °Suprapubic pain/pressure: yes °Flank pain: yes; has been having a lot of falls °Fever: no °Nausea: no °Vomiting: no °Status: stable °Previous urinary tract infection: yes; history of prostatitis  °Recurrent urinary tract infection: yes ° °Patient reports multiple falls in the past couple of days.  He reports at one time he fell and could not get up, so he slept in the floor and got up the next day.   ° °His blood pressure is extremely low today and he is having a difficult time holding a conversation with me today. ° °Allergies  °Allergen Reactions  ° Codeine Other (See Comments)  °  Headache  ° Morphine And Related Other (See Comments)  °  Makes him feel weird  ° Revlimid [Lenalidomide] Other (See Comments)  °  "Causes me to become weak"  ° ° °Outpatient Encounter Medications as of 03/01/2021  °Medication Sig  ° acyclovir (ZOVIRAX) 400 MG tablet TAKE 1 TABLET BY MOUTH TWICE A DAY  ° ALPRAZolam (XANAX) 1 MG tablet TAKE 1 TABLET BY MOUTH 3 TIMES A DAY AS NEEDED FOR ANXIETY.  ° CALCIUM-VITAMIN D PO Take 1 tablet by mouth 2 (two) times daily.   ° ciprofloxacin (CIPRO) 500 MG tablet Take 1 tablet (500 mg total) by mouth 2 (two) times daily for 10 days.  ° dexamethasone (DECADRON) 4 MG tablet   ° fluticasone (FLONASE) 50 MCG/ACT nasal spray Place 2 sprays into both nostrils daily.  ° furosemide (LASIX) 20 MG tablet TAKE 1 TABLET BY MOUTH ONCE A DAY AS NEEDED  ° gabapentin (NEURONTIN) 400 MG capsule Take 1 capsule (400 mg total) by mouth 3 (three) times  daily.  ° Homeopathic Products (THERAWORX RELIEF EX) Apply topically as needed.  ° ibuprofen (ADVIL) 600 MG tablet Take 1 tablet (600 mg total) by mouth every 8 (eight) hours as needed.  ° lidocaine-prilocaine (EMLA) cream Apply small amount over port one (1) hour prior to appointment.  ° montelukast (SINGULAIR) 10 MG tablet TAKE 1 TABLET BY MOUTH EVERY NIGHT AT BEDTIME  ° omeprazole (PRILOSEC) 20 MG capsule TAKE 1 CAPSULE BY MOUTH ONCE DAILY  ° oxybutynin (DITROPAN-XL) 10 MG 24 hr tablet TAKE 1 TABLET BY MOUTH EVERY NIGHT AT BEDTIME  ° Oxycodone HCl 10 MG TABS Take 1-2 tablets (10-20 mg total) by mouth 3 (three) times daily as needed.  ° senna-docusate (SENOKOT-S) 8.6-50 MG tablet Take 1 tablet by mouth 2 (two) times daily.  ° silver sulfADIAZINE (SILVADENE) 1 % cream Apply 1 application topically daily.  ° tamsulosin (FLOMAX) 0.4 MG CAPS capsule TAKE ONE CAPSULE BY MOUTH EVERY NIGHT ATBEDTIME  ° traZODone (DESYREL) 100 MG tablet TAKE 3 TABLETS BY MOUTH AT BEDTIME  ° triamcinolone cream (KENALOG) 0.1 % APPLY 1 APPLICATION TOPICALLY TWICE A DAY  ° TURMERIC PO Take by mouth.  ° zolendronic acid (ZOMETA) 4 MG/5ML injection Inject 4 mg into the vein every 8 (eight) weeks.  ° °Facility-Administered Encounter Medications as of 03/01/2021  °Medication  ° 0.9 %    sodium chloride infusion   [COMPLETED] cefTRIAXone (ROCEPHIN) injection 1 g   heparin lock flush 100 unit/mL   heparin lock flush 100 unit/mL    Patient Active Problem List   Diagnosis Date Noted   Spinal stenosis, lumbar 10/13/2016   Chronic back pain 10/01/2016   DDD (degenerative disc disease), lumbar 10/01/2016   OA (osteoarthritis) of knee 10/01/2016   Diverticulitis s/p robotic sigmoid colectomy 07/11/2015 07/11/2015   Goals of care, counseling/discussion    Abnormal CT scan, sigmoid colon 04/02/2015   Abnormal PET scan of colon 04/02/2015   Thrombocytopenia ([place]) 01/09/2015   Multiple myeloma without remission ([place]) 02/15/2014   Allergic  rhinitis 01/16/2014   History of vertebral stress fracture 01/16/2014   Dermatitis 01/16/2014   Loss of weight 09/05/2013   Insomnia 09/05/2013   Fatigue 05/16/2013   Neck pain 12/07/2012   Skull lesion 11/29/2012   Neuropathy ([place])    BPH (benign prostatic hyperplasia)    GERD (gastroesophageal reflux disease)    H/O ETOH abuse     Past Medical History:  Diagnosis Date   BPH (benign prostatic hyperplasia)    Colonic diverticular abscess 01/08/2015   Diverticulitis 41/74/08   complicated by abscess and required percutaneous drainage   GERD (gastroesophageal reflux disease)    H/O ETOH abuse    History of chemotherapy last done jan 2017   History of radiation therapy 01/05/13-02/10/13   45 gray to left occipital condyle region   History of radiation therapy 12/01/16-12/10/16   Parasternal nodule, chest- 24 Gy total delivered in 8 fractions, Left sacro-iliac, pelvis- 24 Gy total delivered in 8 fractions    History of radiation therapy 02/19/17-02/22/17   right temporal scalp 30 Gy in 10 fractions   Intra-abdominal abscess ([place])    Multiple myeloma ([place]) 2015   Neuropathy    Radiation 02/21/14-03/08/14   right posterior chest wall area 30 gray   Radiation 04/19/14-05/02/14   lumbar spine 25 gray   Skull lesion    Left occipital condyle   Wrist fracture, left    x 2    Relevant past medical, surgical, family and social history reviewed and updated as indicated. Interim medical history since our last visit reviewed.  Review of Systems Per HPI unless specifically indicated above     Objective:    BP 102/84    Pulse 80    Ht 5' 8" (1.727 m)    Wt 181 lb (82.1 kg)    SpO2 98%    BMI 27.52 kg/m   Wt Readings from Last 3 Encounters:  03/01/21 181 lb (82.1 kg)  02/20/21 175 lb (79.4 kg)  02/13/21 176 lb 6.4 oz (80 kg)    Physical Exam Vitals and nursing note reviewed.  Constitutional:      General: He is not in acute distress.    Appearance: He is ill-appearing and  toxic-appearing. He is not diaphoretic.  HENT:     Head: Normocephalic and atraumatic.  Cardiovascular:     Rate and Rhythm: Normal rate.     Heart sounds: Normal heart sounds. No murmur heard. Pulmonary:     Effort: Pulmonary effort is normal. No respiratory distress.     Breath sounds: Normal breath sounds. No wheezing, rhonchi or rales.  Abdominal:     General: Abdomen is flat. Bowel sounds are normal.     Palpations: Abdomen is soft.  Genitourinary:    Comments: deferred Musculoskeletal:     Right lower leg: No edema.  Left lower leg: No edema.  °Skin: °   General: Skin is warm.  °   Capillary Refill: Capillary refill takes less than 2 seconds.  °   Coloration: Skin is pale. Skin is not jaundiced.  °   Findings: No erythema.  °Neurological:  °   Mental Status: He is oriented to person, place, and time.  °   Motor: No weakness.  °   Gait: Gait abnormal (walking with walker).  °Psychiatric:     °   Speech: Speech normal.     °   Behavior: Behavior normal.     °   Thought Content: Thought content normal.  ° ° °Results for orders placed or performed in visit on 03/01/21  °Urinalysis, Routine w reflex microscopic  °Result Value Ref Range  ° Color, Urine DARK YELLOW YELLOW  ° APPearance CLOUDY (A) CLEAR  ° Specific Gravity, Urine 1.025 1.001 - 1.035  ° pH 6.0 5.0 - 8.0  ° Glucose, UA NEGATIVE NEGATIVE  ° Bilirubin Urine NEGATIVE NEGATIVE  ° Ketones, ur NEGATIVE NEGATIVE  ° Hgb urine dipstick 1+ (A) NEGATIVE  ° Protein, ur 1+ (A) NEGATIVE  ° Nitrite POSITIVE (A) NEGATIVE  ° Leukocytes,Ua 3+ (A) NEGATIVE  ° WBC, UA 40-60 (A) 0 - 5 /HPF  ° RBC / HPF 3-10 (A) 0 - 2 /HPF  ° Squamous Epithelial / LPF NONE SEEN < OR = 5 /HPF  ° Bacteria, UA FEW (A) NONE SEEN /HPF  ° Hyaline Cast NONE SEEN NONE SEEN /LPF  °Microscopic Message  °Result Value Ref Range  ° Note    ° °*Note: Due to a large number of results and/or encounters for the requested time period, some results have not been displayed. A complete set of  results can be found in Results Review.  ° °   °Assessment & Plan:  °1. Hypotension due to hypovolemia °1000 mL bolus of sodium chloride infusion given over 2 hours.  The patient responded well to this; he became more alert and his color improved.  However, his blood pressure remains low.  I suspect the dehydration is secondary to a urinary tract infection and possibly prostatitis.  I recommend the patient be transported via EMS to the nearest Emergency Department for IV fluids and antibiotics for the urinary tract infection.  The patient adamantly declines this.  He wishes to go home.  I educated him that I worry his body will go into septic shock at home and he will not be able to keep up with the fluid his body demands by mouth at home.  He may pass out and become unconscious and not survive.  He understands the risks and ensures me he will drink plenty of water and gatorade at home.  He ensures me his wife will drive home and his daughter will come by and check on him and bring him his medication. He assured me if his symptoms get worse over the weekend, he will call 911. ° °Again, I explained to the patient my concerns with him not going to the hospital for IV fluids and antibiotic management.  The patient is able to make decisions for himself and refuses despite the risks that I explain to him including shock, unconsciousness, and death.  He leaves the office to go home against my medical advice.  ° °- 0.9 %  sodium chloride infusion ° °2. Dysuria °Acute on chronic.  He is unable to tell me how long this has been going on   for.  UA today shows 1+ blood, 1+ protein, nitrites, and 3+ leukocyte esterase.  Microscopic exam shows 40-60 WBC, 3-10 RBC, and few bacteria.  We are sending urine for culture.  As above, I recommended the patient be transported via EMS to the nearest ED for IV fluids and antibiotics.  The patient adamantly declines.  We will treat the lower urinary tract infection with IM ceftriaxone in the  office and then ciprofloxacin 500 mg twice daily for the next 10 days.  Over the weekend, if he worsens at all, he is to seek emergent care and call 911.  Follow up in our office on Monday. ° °- Urinalysis, Routine w reflex microscopic °- Urine Culture °- cefTRIAXone (ROCEPHIN) injection 1 g °- Microscopic Message °- ciprofloxacin (CIPRO) 500 MG tablet; Take 1 tablet (500 mg total) by mouth 2 (two) times daily for 10 days.  Dispense: 20 tablet; Refill: 0 ° °  ° °Follow up plan: °Return in about 3 days (around 03/04/2021) for follow up. °

## 2021-03-02 ENCOUNTER — Encounter (HOSPITAL_COMMUNITY): Payer: Self-pay | Admitting: Hematology

## 2021-03-02 DIAGNOSIS — D696 Thrombocytopenia, unspecified: Secondary | ICD-10-CM | POA: Diagnosis not present

## 2021-03-02 DIAGNOSIS — C9000 Multiple myeloma not having achieved remission: Secondary | ICD-10-CM | POA: Diagnosis not present

## 2021-03-02 DIAGNOSIS — M549 Dorsalgia, unspecified: Secondary | ICD-10-CM | POA: Diagnosis not present

## 2021-03-02 DIAGNOSIS — M25551 Pain in right hip: Secondary | ICD-10-CM | POA: Diagnosis not present

## 2021-03-02 DIAGNOSIS — R296 Repeated falls: Secondary | ICD-10-CM | POA: Diagnosis not present

## 2021-03-02 DIAGNOSIS — M25552 Pain in left hip: Secondary | ICD-10-CM | POA: Diagnosis not present

## 2021-03-04 ENCOUNTER — Other Ambulatory Visit: Payer: Self-pay

## 2021-03-04 ENCOUNTER — Ambulatory Visit (INDEPENDENT_AMBULATORY_CARE_PROVIDER_SITE_OTHER): Payer: Medicare Other | Admitting: Family Medicine

## 2021-03-04 VITALS — Temp 96.9°F | Resp 18 | Ht 68.0 in | Wt 181.0 lb

## 2021-03-04 DIAGNOSIS — L519 Erythema multiforme, unspecified: Secondary | ICD-10-CM | POA: Diagnosis not present

## 2021-03-04 DIAGNOSIS — E861 Hypovolemia: Secondary | ICD-10-CM | POA: Diagnosis not present

## 2021-03-04 DIAGNOSIS — I9589 Other hypotension: Secondary | ICD-10-CM

## 2021-03-04 DIAGNOSIS — N3000 Acute cystitis without hematuria: Secondary | ICD-10-CM | POA: Diagnosis not present

## 2021-03-04 LAB — URINE CULTURE
MICRO NUMBER:: 12929836
SPECIMEN QUALITY:: ADEQUATE

## 2021-03-04 MED ORDER — NITROFURANTOIN MONOHYD MACRO 100 MG PO CAPS: 100.0000 mg | ORAL_CAPSULE | Freq: Two times a day (BID) | ORAL | 0 refills | Status: DC

## 2021-03-04 MED ORDER — METHYLPREDNISOLONE ACETATE 80 MG/ML IJ SUSP
60.0000 mg | Freq: Once | INTRAMUSCULAR | Status: AC
  Administered 2021-03-04: 60 mg via INTRAMUSCULAR

## 2021-03-04 MED ORDER — CEPHALEXIN 500 MG PO CAPS: 500.0000 mg | ORAL_CAPSULE | Freq: Four times a day (QID) | ORAL | 0 refills | Status: AC

## 2021-03-04 MED ORDER — CEFTRIAXONE SODIUM 1 G IJ SOLR
1.0000 g | Freq: Once | INTRAMUSCULAR | Status: AC
  Administered 2021-03-04: 1 g via INTRAMUSCULAR

## 2021-03-04 NOTE — Addendum Note (Signed)
Addended by: Wadie Lessen on: 03/04/2021 10:54 AM   Modules accepted: Orders

## 2021-03-04 NOTE — Addendum Note (Signed)
Addended by: Noemi Chapel A on: 03/04/2021 08:27 AM   Modules accepted: Orders

## 2021-03-04 NOTE — Progress Notes (Signed)
Subjective:    Patient ID: Dean [name]., male    DOB: [birth year], 86 y.o.   MRN: [id number]  HPI  Please see the patient's office visit from last week.  At that time he was diagnosed with a urinary tract infection and was started on Cipro.  He was also hypotensive.  Patient is here today for follow-up.  He is very pleasant 86 year old gentleman with a history of multiple myeloma.  He states that he feels weak and tired.  I am unable to get his blood pressure today.  He denies any orthostatic dizziness.  There is no tachycardia.  However his blood pressure is difficult to auscultate.  I believe that his systolic pressure is around 92 and his diastolic blood pressure is around 50 but is very difficult to hear.  He denies any chest pain or shortness of breath.  There is no tachycardia.  However he has broke out into a rash on both legs.  The rash is a widespread erythematous macular rash.  There are numerous target-like lesions.  I believe the patient has developed erythema multiforme likely secondary to the Cipro versus early Stevens-Johnson syndrome.  There is no mucosal involvement.  There are no blisters around the lips of the tongue or the rectum or penis.  He denies any trouble breathing or swallowing.   Past Medical History:  Diagnosis Date   BPH (benign prostatic hyperplasia)    Colonic diverticular abscess 01/08/2015   Diverticulitis 42/35/36   complicated by abscess and required percutaneous drainage   GERD (gastroesophageal reflux disease)    H/O ETOH abuse    History of chemotherapy last done jan 2017   History of radiation therapy 01/05/13-02/10/13   45 gray to left occipital condyle region   History of radiation therapy 12/01/16-12/10/16   Parasternal nodule, chest- 24 Gy total delivered in 8 fractions, Left sacro-iliac, pelvis- 24 Gy total delivered in 8 fractions    History of radiation therapy 02/19/17-02/22/17   right temporal scalp 30 Gy in 10 fractions   Intra-abdominal  abscess (HCC)    Multiple myeloma ([place]) 2015   Neuropathy    Radiation 02/21/14-03/08/14   right posterior chest wall area 30 gray   Radiation 04/19/14-05/02/14   lumbar spine 25 gray   Skull lesion    Left occipital condyle   Wrist fracture, left    x 2   Past Surgical History:  Procedure Laterality Date   COLONOSCOPY N/A 04/19/2015   Procedure: COLONOSCOPY;  Surgeon: Danie Binder, MD;  Location: [facility];  Service: Endoscopy;  Laterality: N/A;  1:30 PM   CYST EXCISION  1959   tail bone   IR IMAGING GUIDED PORT INSERTION  09/25/2017   Current Outpatient Medications on File Prior to Visit  Medication Sig Dispense Refill   acyclovir (ZOVIRAX) 400 MG tablet TAKE 1 TABLET BY MOUTH TWICE A DAY 60 tablet 6   ALPRAZolam (XANAX) 1 MG tablet TAKE 1 TABLET BY MOUTH 3 TIMES A DAY AS NEEDED FOR ANXIETY. 60 tablet 3   CALCIUM-VITAMIN D PO Take 1 tablet by mouth 2 (two) times daily.      cephALEXin (KEFLEX) 500 MG capsule Take 1 capsule (500 mg total) by mouth every 6 (six) hours for 14 days. 56 capsule 0   ciprofloxacin (CIPRO) 500 MG tablet Take 1 tablet (500 mg total) by mouth 2 (two) times daily for 10 days. 20 tablet 0   dexamethasone (DECADRON) 4 MG tablet  fluticasone (FLONASE) 50 MCG/ACT nasal spray Place 2 sprays into both nostrils daily. 16 g 6   furosemide (LASIX) 20 MG tablet TAKE 1 TABLET BY MOUTH ONCE A DAY AS NEEDED 30 tablet 3   gabapentin (NEURONTIN) 400 MG capsule TAKE 1 CAPSULE BY MOUTH 3 TIMES A DAY 90 capsule 4   Homeopathic Products (THERAWORX RELIEF EX) Apply topically as needed.     ibuprofen (ADVIL) 600 MG tablet Take 1 tablet (600 mg total) by mouth every 8 (eight) hours as needed. 30 tablet 0   lidocaine-prilocaine (EMLA) cream Apply small amount over port one (1) hour prior to appointment. 30 g 0   montelukast (SINGULAIR) 10 MG tablet TAKE 1 TABLET BY MOUTH EVERY NIGHT AT BEDTIME 30 tablet 3   nitrofurantoin, macrocrystal-monohydrate, (MACROBID) 100 MG capsule  Take 1 capsule (100 mg total) by mouth 2 (two) times daily for 14 days. 28 capsule 0   omeprazole (PRILOSEC) 20 MG capsule TAKE 1 CAPSULE BY MOUTH ONCE DAILY 30 capsule 5   oxybutynin (DITROPAN-XL) 10 MG 24 hr tablet TAKE 1 TABLET BY MOUTH EVERY NIGHT AT BEDTIME 30 tablet 1   Oxycodone HCl 10 MG TABS TAKE 1 TO 2 TABLETS (10-20MG) BY MOUTH 3TIMES DAILY AS NEEDED. 90 tablet 0   senna-docusate (SENOKOT-S) 8.6-50 MG tablet Take 1 tablet by mouth 2 (two) times daily. 60 tablet 5   silver sulfADIAZINE (SILVADENE) 1 % cream Apply 1 application topically daily. 50 g 0   tamsulosin (FLOMAX) 0.4 MG CAPS capsule TAKE ONE CAPSULE BY MOUTH EVERY NIGHT ATBEDTIME 30 capsule 11   traZODone (DESYREL) 100 MG tablet TAKE 3 TABLETS BY MOUTH AT BEDTIME 90 tablet 1   triamcinolone cream (KENALOG) 0.1 % APPLY 1 APPLICATION TOPICALLY TWICE A DAY 30 g 0   TURMERIC PO Take by mouth.     zolendronic acid (ZOMETA) 4 MG/5ML injection Inject 4 mg into the vein every 8 (eight) weeks.     Current Facility-Administered Medications on File Prior to Visit  Medication Dose Route Frequency Provider Last Rate Last Admin   0.9 %  sodium chloride infusion   Intravenous Once Noemi Chapel A, NP       heparin lock flush 100 unit/mL  500 Units Intravenous Once Lloyd Huger, MD       heparin lock flush 100 unit/mL  500 Units Intracatheter Once PRN Lloyd Huger, MD       Allergies  Allergen Reactions   Codeine Other (See Comments)    Headache   Morphine And Related Other (See Comments)    Makes him feel weird   Revlimid [Lenalidomide] Other (See Comments)    "Causes me to become weak"   Social History   Socioeconomic History   Marital status: Married    Spouse name: Not on file   Number of children: 3   Years of education: Not on file   Highest education level: Not on file  Occupational History    Employer: RETIRED  Tobacco Use   Smoking status: Never   Smokeless tobacco: Never  Vaping Use   Vaping  Use: Never used  Substance and Sexual Activity   Alcohol use: No    Comment: " Not much no more"   Drug use: No   Sexual activity: Not on file  Other Topics Concern   Not on file  Social History Narrative   Not on file   Social Determinants of Health   Financial Resource Strain: Not on file  Food  Insecurity: Not on file  Transportation Needs: Not on file  Physical Activity: Not on file  Stress: Not on file  Social Connections: Not on file  Intimate Partner Violence: Not on file      Review of Systems     Objective:   Physical Exam Vitals reviewed.  Constitutional:      General: He is not in acute distress.    Appearance: He is obese. He is not ill-appearing or toxic-appearing.  Cardiovascular:     Rate and Rhythm: Normal rate and regular rhythm.     Pulses: Normal pulses.     Heart sounds: Normal heart sounds.  Pulmonary:     Effort: Pulmonary effort is normal. No respiratory distress.     Breath sounds: Normal breath sounds. No stridor. No wheezing, rhonchi or rales.  Abdominal:     General: Abdomen is flat. Bowel sounds are normal.     Palpations: Abdomen is soft.  Skin:    Findings: Erythema and rash present.  Neurological:     General: No focal deficit present.     Mental Status: He is alert and oriented to person, place, and time.     Cranial Nerves: No cranial nerve deficit.     Motor: No weakness.          Assessment & Plan:  Hypotension due to hypovolemia  Acute cystitis without hematuria  Erythema multiforme Patient refuses to go to the hospital [place].  I will try to treat him as best I can.  He states that he prefers to be at home even if it means dying at home.  I will give him 1 g of Rocephin.  Culture returned showing resistance to Cipro and both the E. coli and staph epidermis.  Therefore I will switch to Keflex 500 mg p.o. 3 times daily.  Recheck the patient tomorrow.  Encouraged him to go to the emergency room if  worsening.  I will give him 1 g of Rocephin now and also 60 mg of Depo-Medrol.  I encouraged him to stop the Cipro immediately as this is the cause of the allergic reaction.

## 2021-03-05 ENCOUNTER — Ambulatory Visit (HOSPITAL_COMMUNITY): Payer: Medicare Other | Admitting: Physical Therapy

## 2021-03-05 ENCOUNTER — Ambulatory Visit (HOSPITAL_COMMUNITY): Payer: Medicare Other

## 2021-03-05 ENCOUNTER — Ambulatory Visit (INDEPENDENT_AMBULATORY_CARE_PROVIDER_SITE_OTHER): Payer: Medicare Other | Admitting: Family Medicine

## 2021-03-05 VITALS — BP 98/62 | HR 66 | Temp 98.0°F | Resp 18 | Ht 68.0 in | Wt 187.0 lb

## 2021-03-05 DIAGNOSIS — E861 Hypovolemia: Secondary | ICD-10-CM

## 2021-03-05 DIAGNOSIS — M549 Dorsalgia, unspecified: Secondary | ICD-10-CM | POA: Diagnosis not present

## 2021-03-05 DIAGNOSIS — L519 Erythema multiforme, unspecified: Secondary | ICD-10-CM | POA: Diagnosis not present

## 2021-03-05 DIAGNOSIS — R296 Repeated falls: Secondary | ICD-10-CM | POA: Diagnosis not present

## 2021-03-05 DIAGNOSIS — N3000 Acute cystitis without hematuria: Secondary | ICD-10-CM

## 2021-03-05 DIAGNOSIS — M25552 Pain in left hip: Secondary | ICD-10-CM | POA: Diagnosis not present

## 2021-03-05 DIAGNOSIS — D696 Thrombocytopenia, unspecified: Secondary | ICD-10-CM | POA: Diagnosis not present

## 2021-03-05 DIAGNOSIS — M25551 Pain in right hip: Secondary | ICD-10-CM | POA: Diagnosis not present

## 2021-03-05 DIAGNOSIS — I9589 Other hypotension: Secondary | ICD-10-CM

## 2021-03-05 DIAGNOSIS — C9000 Multiple myeloma not having achieved remission: Secondary | ICD-10-CM | POA: Diagnosis not present

## 2021-03-05 LAB — CBC WITH DIFFERENTIAL/PLATELET
Absolute Monocytes: 681 cells/uL (ref 200–950)
Basophils Absolute: 7 cells/uL (ref 0–200)
Basophils Relative: 0.1 %
Eosinophils Absolute: 22 cells/uL (ref 15–500)
Eosinophils Relative: 0.3 %
HCT: 37.1 % — ABNORMAL LOW (ref 38.5–50.0)
Hemoglobin: 12.2 g/dL — ABNORMAL LOW (ref 13.2–17.1)
Lymphs Abs: 2124 cells/uL (ref 850–3900)
MCH: 30.8 pg (ref 27.0–33.0)
MCHC: 32.9 g/dL (ref 32.0–36.0)
MCV: 93.7 fL (ref 80.0–100.0)
MPV: 11.3 fL (ref 7.5–12.5)
Monocytes Relative: 9.2 %
Neutro Abs: 4566 cells/uL (ref 1500–7800)
Neutrophils Relative %: 61.7 %
Platelets: 106 10*3/uL — ABNORMAL LOW (ref 140–400)
RBC: 3.96 10*6/uL — ABNORMAL LOW (ref 4.20–5.80)
RDW: 13.5 % (ref 11.0–15.0)
Total Lymphocyte: 28.7 %
WBC: 7.4 10*3/uL (ref 3.8–10.8)

## 2021-03-05 LAB — BASIC METABOLIC PANEL WITH GFR
BUN: 24 mg/dL (ref 7–25)
CO2: 31 mmol/L (ref 20–32)
Calcium: 8.5 mg/dL — ABNORMAL LOW (ref 8.6–10.3)
Chloride: 102 mmol/L (ref 98–110)
Creat: 1 mg/dL (ref 0.70–1.22)
Glucose, Bld: 83 mg/dL (ref 65–99)
Potassium: 4.2 mmol/L (ref 3.5–5.3)
Sodium: 138 mmol/L (ref 135–146)
eGFR: 75 mL/min/{1.73_m2} (ref >=60)

## 2021-03-05 MED ORDER — PREDNISONE 20 MG PO TABS: 40.0000 mg | ORAL_TABLET | Freq: Every day | ORAL | 0 refills | Status: DC

## 2021-03-05 NOTE — Progress Notes (Signed)
Subjective:    Patient ID: Dean Danker., male    DOB: Aug 11, 1935, 86 y.o.   MRN: [id number]  HPI 03/04/21 Please see the patient's office visit from last week.  At that time he was diagnosed with a urinary tract infection and was started on Cipro.  He was also hypotensive.  Patient is here today for follow-up.  He is very pleasant 86 year old gentleman with a history of multiple myeloma.  He states that he feels weak and tired.  I am unable to get his blood pressure today.  He denies any orthostatic dizziness.  There is no tachycardia.  However his blood pressure is difficult to auscultate.  I believe that his systolic pressure is around 92 and his diastolic blood pressure is around 50 but is very difficult to hear.  He denies any chest pain or shortness of breath.  There is no tachycardia.  However he has broke out into a rash on both legs.  The rash is a widespread erythematous macular rash.  There are numerous target-like lesions.  I believe the patient has developed erythema multiforme likely secondary to the Cipro versus early Stevens-Johnson syndrome.  There is no mucosal involvement.  There are no blisters around the lips of the tongue or the rectum or penis.  He denies any trouble breathing or swallowing.  At that time, my plan was: Patient refuses to go to the hospital [place].  I will try to treat him as best I can.  He states that he prefers to be at home even if it means dying at home.  I will give him 1 g of Rocephin.  Culture returned showing resistance to Cipro and both the E. coli and staph epidermis.  Therefore I will switch to Keflex 500 mg p.o. 3 times daily.  Recheck the patient tomorrow.  Encouraged him to go to the emergency room if worsening.  I will give him 1 g of Rocephin now and also 60 mg of Depo-Medrol.  I encouraged him to stop the Cipro immediately as this is the cause of the allergic reaction.  03/05/21 Fortunately, his rash on his leg has faded  considerably from yesterday.  Itching is much better.  He feels stronger.  He states that the dysuria has improved and is now mild.  His blood pressure is better.  It is still extremely low but at least we can easily record it now.  He denies any fainting.  He denies any syncope or chest pain or shortness of breath.  He denies any fevers or chills.  He denies any itching or blisters in his mouth or throat or anus or on his penis. Past Medical History:  Diagnosis Date   BPH (benign prostatic hyperplasia)    Colonic diverticular abscess 01/08/2015   Diverticulitis 00/76/22   complicated by abscess and required percutaneous drainage   GERD (gastroesophageal reflux disease)    H/O ETOH abuse    History of chemotherapy last done jan 2017   History of radiation therapy 01/05/13-02/10/13   45 gray to left occipital condyle region   History of radiation therapy 12/01/16-12/10/16   Parasternal nodule, chest- 24 Gy total delivered in 8 fractions, Left sacro-iliac, pelvis- 24 Gy total delivered in 8 fractions    History of radiation therapy 02/19/17-02/22/17   right temporal scalp 30 Gy in 10 fractions   Intra-abdominal abscess ([place])    Multiple myeloma ([place]) 2015   Neuropathy    Radiation 02/21/14-03/08/14   right  posterior chest wall area 30 gray   Radiation 04/19/14-05/02/14   lumbar spine 25 gray   Skull lesion    Left occipital condyle   Wrist fracture, left    x 2   Past Surgical History:  Procedure Laterality Date   COLONOSCOPY N/A 04/19/2015   Procedure: COLONOSCOPY;  Surgeon: Danie Binder, MD;  Location[facility];  Service: Endoscopy;  Laterality: N/A;  1:30 PM   CYST EXCISION  1959   tail bone   IR IMAGING GUIDED PORT INSERTION  09/25/2017   Current Outpatient Medications on File Prior to Visit  Medication Sig Dispense Refill   acyclovir (ZOVIRAX) 400 MG tablet TAKE 1 TABLET BY MOUTH TWICE A DAY 60 tablet 6   ALPRAZolam (XANAX) 1 MG tablet TAKE 1 TABLET BY MOUTH 3 TIMES A DAY AS  NEEDED FOR ANXIETY. 60 tablet 3   CALCIUM-VITAMIN D PO Take 1 tablet by mouth 2 (two) times daily.      cephALEXin (KEFLEX) 500 MG capsule Take 1 capsule (500 mg total) by mouth every 6 (six) hours for 14 days. 56 capsule 0   dexamethasone (DECADRON) 4 MG tablet      fluticasone (FLONASE) 50 MCG/ACT nasal spray Place 2 sprays into both nostrils daily. 16 g 6   furosemide (LASIX) 20 MG tablet TAKE 1 TABLET BY MOUTH ONCE A DAY AS NEEDED 30 tablet 3   gabapentin (NEURONTIN) 400 MG capsule TAKE 1 CAPSULE BY MOUTH 3 TIMES A DAY 90 capsule 4   Homeopathic Products (THERAWORX RELIEF EX) Apply topically as needed.     ibuprofen (ADVIL) 600 MG tablet Take 1 tablet (600 mg total) by mouth every 8 (eight) hours as needed. 30 tablet 0   lidocaine-prilocaine (EMLA) cream Apply small amount over port one (1) hour prior to appointment. 30 g 0   montelukast (SINGULAIR) 10 MG tablet TAKE 1 TABLET BY MOUTH EVERY NIGHT AT BEDTIME 30 tablet 3   omeprazole (PRILOSEC) 20 MG capsule TAKE 1 CAPSULE BY MOUTH ONCE DAILY 30 capsule 5   oxybutynin (DITROPAN-XL) 10 MG 24 hr tablet TAKE 1 TABLET BY MOUTH EVERY NIGHT AT BEDTIME 30 tablet 1   Oxycodone HCl 10 MG TABS TAKE 1 TO 2 TABLETS (10-20MG) BY MOUTH 3TIMES DAILY AS NEEDED. 90 tablet 0   senna-docusate (SENOKOT-S) 8.6-50 MG tablet Take 1 tablet by mouth 2 (two) times daily. 60 tablet 5   silver sulfADIAZINE (SILVADENE) 1 % cream Apply 1 application topically daily. 50 g 0   tamsulosin (FLOMAX) 0.4 MG CAPS capsule TAKE ONE CAPSULE BY MOUTH EVERY NIGHT ATBEDTIME 30 capsule 11   traZODone (DESYREL) 100 MG tablet TAKE 3 TABLETS BY MOUTH AT BEDTIME 90 tablet 1   triamcinolone cream (KENALOG) 0.1 % APPLY 1 APPLICATION TOPICALLY TWICE A DAY 30 g 0   TURMERIC PO Take by mouth.     zolendronic acid (ZOMETA) 4 MG/5ML injection Inject 4 mg into the vein every 8 (eight) weeks.     Current Facility-Administered Medications on File Prior to Visit  Medication Dose Route Frequency  Provider Last Rate Last Admin   0.9 %  sodium chloride infusion   Intravenous Once Noemi Chapel A, NP       heparin lock flush 100 unit/mL  500 Units Intravenous Once Lloyd Huger, MD       heparin lock flush 100 unit/mL  500 Units Intracatheter Once PRN Lloyd Huger, MD       Allergies  Allergen Reactions  Codeine Other (See Comments)    Headache   Morphine And Related Other (See Comments)    Makes him feel weird   Revlimid [Lenalidomide] Other (See Comments)    "Causes me to become weak"   Social History   Socioeconomic History   Marital status: Married    Spouse name: Not on file   Number of children: 3   Years of education: Not on file   Highest education level: Not on file  Occupational History    Employer: RETIRED  Tobacco Use   Smoking status: Never   Smokeless tobacco: Never  Vaping Use   Vaping Use: Never used  Substance and Sexual Activity   Alcohol use: No    Comment: " Not much no more"   Drug use: No   Sexual activity: Not on file  Other Topics Concern   Not on file  Social History Narrative   Not on file   Social Determinants of Health   Financial Resource Strain: Not on file  Food Insecurity: Not on file  Transportation Needs: Not on file  Physical Activity: Not on file  Stress: Not on file  Social Connections: Not on file  Intimate Partner Violence: Not on file      Review of Systems     Objective:   Physical Exam Vitals reviewed.  Constitutional:      General: He is not in acute distress.    Appearance: He is obese. He is not ill-appearing or toxic-appearing.  Cardiovascular:     Rate and Rhythm: Normal rate and regular rhythm.     Pulses: Normal pulses.     Heart sounds: Normal heart sounds.  Pulmonary:     Effort: Pulmonary effort is normal. No respiratory distress.     Breath sounds: Normal breath sounds. No stridor. No wheezing, rhonchi or rales.  Abdominal:     General: Abdomen is flat. Bowel sounds are  normal.     Palpations: Abdomen is soft.  Skin:    Findings: Erythema and rash present.  Neurological:     General: No focal deficit present.     Mental Status: He is alert and oriented to person, place, and time.     Cranial Nerves: No cranial nerve deficit.     Motor: No weakness.          Assessment & Plan:  Hypotension due to hypovolemia - Plan: CBC with Differential/Platelet, BASIC METABOLIC PANEL WITH GFR  Acute cystitis without hematuria  Erythema multiforme Thankfully, everything seems better today.  Therefore I feel that the patient simply needs to complete the Keflex at home.  This should resolve the cystitis.  The erythema multiforme has not spread or progressed.  We will continue to refrain from using Cipro and I will put him on prednisone 40 mg a day for 5 days.  Patient is still hypotensive.  I encouraged him to push Gatorade and fluids.  He is also having some cramping so we will check a BMP to see if he requires potassium.  If labs are normal, I plan to follow-up with the patient on Friday for recheck sooner if worsening.

## 2021-03-07 DIAGNOSIS — R296 Repeated falls: Secondary | ICD-10-CM | POA: Diagnosis not present

## 2021-03-07 DIAGNOSIS — M25552 Pain in left hip: Secondary | ICD-10-CM | POA: Diagnosis not present

## 2021-03-07 DIAGNOSIS — M549 Dorsalgia, unspecified: Secondary | ICD-10-CM | POA: Diagnosis not present

## 2021-03-07 DIAGNOSIS — C9000 Multiple myeloma not having achieved remission: Secondary | ICD-10-CM | POA: Diagnosis not present

## 2021-03-07 DIAGNOSIS — M25551 Pain in right hip: Secondary | ICD-10-CM | POA: Diagnosis not present

## 2021-03-07 DIAGNOSIS — D696 Thrombocytopenia, unspecified: Secondary | ICD-10-CM | POA: Diagnosis not present

## 2021-03-11 DIAGNOSIS — M549 Dorsalgia, unspecified: Secondary | ICD-10-CM | POA: Diagnosis not present

## 2021-03-11 DIAGNOSIS — D696 Thrombocytopenia, unspecified: Secondary | ICD-10-CM | POA: Diagnosis not present

## 2021-03-11 DIAGNOSIS — R296 Repeated falls: Secondary | ICD-10-CM | POA: Diagnosis not present

## 2021-03-11 DIAGNOSIS — M25551 Pain in right hip: Secondary | ICD-10-CM | POA: Diagnosis not present

## 2021-03-11 DIAGNOSIS — M25552 Pain in left hip: Secondary | ICD-10-CM | POA: Diagnosis not present

## 2021-03-11 DIAGNOSIS — C9000 Multiple myeloma not having achieved remission: Secondary | ICD-10-CM | POA: Diagnosis not present

## 2021-03-11 NOTE — Progress Notes (Signed)
[facility]  [phone] [phone]  ID: Dean [name]. OB: January 24, 1936  MR#: [id number]  FVC#:[id number]  Patient Care Team: [name], MD as PCP - General (Family Medicine) Gery Pray, MD as Consulting Physician (Radiation Oncology) Danie Binder, MD (Inactive) as Consulting Physician (Gastroenterology) Michael Boston, MD as Consulting Physician (General Surgery)  CHIEF COMPLAINT: Multiple myeloma  INTERVAL HISTORY: Patient returns to clinic today for repeat laboratory work, further evaluation, and additional IV fluids.  Since discontinuing chemotherapy approximately 6 weeks ago, he feels significantly improved and nearly back to his baseline.  He does not complain of weakness or fatigue today.  He has had no further falls in the last 3 weeks.  He continues to have insomnia.  He has no neurologic complaints.  He denies any recent fevers or illnesses.  He has a good appetite and denies weight loss.  He has no chest pain, shortness of breath, cough, or hemoptysis.  He denies any nausea, vomiting, constipation, or diarrhea.  He has no urinary complaints.  Patient offers no further specific complaints today.  REVIEW OF SYSTEMS:   Review of Systems  Constitutional: Negative.  Negative for fever, malaise/fatigue and weight loss.  Respiratory: Negative.  Negative for cough, hemoptysis and shortness of breath.   Cardiovascular: Negative.  Negative for chest pain and leg swelling.  Gastrointestinal: Negative.  Negative for abdominal pain and nausea.  Genitourinary: Negative.  Negative for dysuria.  Musculoskeletal: Negative.  Negative for back pain, falls and joint pain.  Skin: Negative.  Negative for rash.  Neurological:  Positive for sensory change. Negative for dizziness, focal weakness, weakness and headaches.  Psychiatric/Behavioral:  The patient has insomnia. The patient is not nervous/anxious.    As per HPI. Otherwise, a complete review  of systems is negative.  PAST MEDICAL HISTORY: Past Medical History:  Diagnosis Date   BPH (benign prostatic hyperplasia)    Colonic diverticular abscess 01/08/2015   Diverticulitis 63/84/66   complicated by abscess and required percutaneous drainage   GERD (gastroesophageal reflux disease)    H/O ETOH abuse    History of chemotherapy last done jan 2017   History of radiation therapy 01/05/13-02/10/13   45 gray to left occipital condyle region   History of radiation therapy 12/01/16-12/10/16   Parasternal nodule, chest- 24 Gy total delivered in 8 fractions, Left sacro-iliac, pelvis- 24 Gy total delivered in 8 fractions    History of radiation therapy 02/19/17-02/22/17   right temporal scalp 30 Gy in 10 fractions   Intra-abdominal abscess ([facility])    Multiple myeloma ([facility]) 2015   Neuropathy    Radiation 02/21/14-03/08/14   right posterior chest wall area 30 gray   Radiation 04/19/14-05/02/14   lumbar spine 25 gray   Skull lesion    Left occipital condyle   Wrist fracture, left    x 2    PAST SURGICAL HISTORY: Past Surgical History:  Procedure Laterality Date   COLONOSCOPY N/A 04/19/2015   Procedure: COLONOSCOPY;  Surgeon: Danie Binder, MD;  Location: [facility];  Service: Endoscopy;  Laterality: N/A;  1:30 PM   CYST EXCISION  1959   tail bone   IR IMAGING GUIDED PORT INSERTION  09/25/2017    FAMILY HISTORY: Family History  Problem Relation Age of Onset   Diabetes Mother    Stroke Mother    Hypertension Father     ADVANCED DIRECTIVES (Y/N):  N  HEALTH MAINTENANCE: Social History   Tobacco Use   Smoking  status: Never   Smokeless tobacco: Never  Vaping Use   Vaping Use: Never used  Substance Use Topics   Alcohol use: No    Comment: " Not much no more"   Drug use: No     Colonoscopy:  PAP:  Bone density:  Lipid panel:  Allergies  Allergen Reactions   Codeine Other (See Comments)    Headache   Morphine And Related Other (See Comments)    Makes him feel  weird   Revlimid [Lenalidomide] Other (See Comments)    "Causes me to become weak"    Current Outpatient Medications  Medication Sig Dispense Refill   acyclovir (ZOVIRAX) 400 MG tablet TAKE 1 TABLET BY MOUTH TWICE A DAY 60 tablet 6   ALPRAZolam (XANAX) 1 MG tablet TAKE 1 TABLET BY MOUTH 3 TIMES A DAY AS NEEDED FOR ANXIETY. 60 tablet 3   CALCIUM-VITAMIN D PO Take 1 tablet by mouth 2 (two) times daily.      cephALEXin (KEFLEX) 500 MG capsule Take 1 capsule (500 mg total) by mouth every 6 (six) hours for 14 days. 56 capsule 0   dexamethasone (DECADRON) 4 MG tablet      fluticasone (FLONASE) 50 MCG/ACT nasal spray Place 2 sprays into both nostrils daily. 16 g 6   furosemide (LASIX) 20 MG tablet TAKE 1 TABLET BY MOUTH ONCE A DAY AS NEEDED 30 tablet 3   gabapentin (NEURONTIN) 400 MG capsule TAKE 1 CAPSULE BY MOUTH 3 TIMES A DAY 90 capsule 4   Homeopathic Products (THERAWORX RELIEF EX) Apply topically as needed.     ibuprofen (ADVIL) 600 MG tablet Take 1 tablet (600 mg total) by mouth every 8 (eight) hours as needed. 30 tablet 0   lidocaine-prilocaine (EMLA) cream Apply small amount over port one (1) hour prior to appointment. 30 g 0   montelukast (SINGULAIR) 10 MG tablet TAKE 1 TABLET BY MOUTH EVERY NIGHT AT BEDTIME 30 tablet 3   omeprazole (PRILOSEC) 20 MG capsule TAKE 1 CAPSULE BY MOUTH ONCE DAILY 30 capsule 5   oxybutynin (DITROPAN-XL) 10 MG 24 hr tablet TAKE 1 TABLET BY MOUTH EVERY NIGHT AT BEDTIME 30 tablet 1   Oxycodone HCl 10 MG TABS TAKE 1 TO 2 TABLETS (10-20MG) BY MOUTH 3TIMES DAILY AS NEEDED. 90 tablet 0   predniSONE (DELTASONE) 20 MG tablet Take 2 tablets (40 mg total) by mouth daily with breakfast. 10 tablet 0   senna-docusate (SENOKOT-S) 8.6-50 MG tablet Take 1 tablet by mouth 2 (two) times daily. 60 tablet 5   silver sulfADIAZINE (SILVADENE) 1 % cream Apply 1 application topically daily. 50 g 0   tamsulosin (FLOMAX) 0.4 MG CAPS capsule TAKE ONE CAPSULE BY MOUTH EVERY NIGHT ATBEDTIME  30 capsule 11   traZODone (DESYREL) 100 MG tablet TAKE 3 TABLETS BY MOUTH AT BEDTIME 90 tablet 1   triamcinolone cream (KENALOG) 0.1 % APPLY 1 APPLICATION TOPICALLY TWICE A DAY 30 g 0   TURMERIC PO Take by mouth.     zolendronic acid (ZOMETA) 4 MG/5ML injection Inject 4 mg into the vein every 8 (eight) weeks.     Current Facility-Administered Medications  Medication Dose Route Frequency Provider Last Rate Last Admin   0.9 %  sodium chloride infusion   Intravenous Once Eulogio Bear, NP       Facility-Administered Medications Ordered in Other Visits  Medication Dose Route Frequency Provider Last Rate Last Admin   0.9 %  sodium chloride infusion   Intravenous Continuous Delight Hoh  J, MD   Stopped at 03/12/21 1055   heparin lock flush 100 unit/mL  500 Units Intravenous Once Lloyd Huger, MD       heparin lock flush 100 unit/mL  500 Units Intracatheter Once PRN Lloyd Huger, MD       sodium chloride flush (NS) 0.9 % injection 10 mL  10 mL Intravenous Once Lloyd Huger, MD        OBJECTIVE: Vitals:   03/12/21 0918  BP: (!) 119/51  Pulse: (!) 56  Temp: 98.4 F (36.9 C)      Body mass index is 27.83 kg/m.    ECOG FS:1 - Symptomatic but completely ambulatory  General: Well-developed, well-nourished, no acute distress. Eyes: Pink conjunctiva, anicteric sclera. HEENT: Normocephalic, moist mucous membranes. Lungs: No audible wheezing or coughing. Heart: Regular rate and rhythm. Abdomen: Soft, nontender, no obvious distention. Musculoskeletal: No edema, cyanosis, or clubbing. Neuro: Alert, answering all questions appropriately. Cranial nerves grossly intact. Skin: No rashes or petechiae noted. Psych: Normal affect.  LAB RESULTS:  Lab Results  Component Value Date   NA 137 03/12/2021   K 3.3 (L) 03/12/2021   CL 101 03/12/2021   CO2 29 03/12/2021   GLUCOSE 120 (H) 03/12/2021   BUN 16 03/12/2021   CREATININE 0.80 03/12/2021   CALCIUM 8.1 (L)  03/12/2021   PROT 5.4 (L) 03/12/2021   ALBUMIN 3.1 (L) 03/12/2021   AST 17 03/12/2021   ALT 13 03/12/2021   ALKPHOS 22 (L) 03/12/2021   BILITOT 0.5 03/12/2021   GFRNONAA >60 03/12/2021   GFRAA >60 10/13/2019    Lab Results  Component Value Date   WBC 6.2 03/12/2021   NEUTROABS 3.5 03/12/2021   HGB 12.6 (L) 03/12/2021   HCT 38.4 (L) 03/12/2021   MCV 93.7 03/12/2021   PLT 160 03/12/2021     STUDIES: No results found.  ASSESSMENT: Multiple myeloma  PLAN:  Multiple myeloma: Please see note from September 17, 2020 for historic details of his myeloma and treatment.  His most recent M spike has increased slightly to 0.5.  This has trended up slightly over the past several months when it was 0.2.  Immunoglobulins continue to be within normal limits and kappa free light chains are chronic and unchanged at 44.5.  Previously, patient was receiving daratumumab every 4 weeks and Zometa every 8 weeks, but this has been temporarily discontinued and his last treatment occurred on January 15, 2021.  Continue to hold treatments, but patient expressed understanding that he likely will need to reinitiate chemotherapy in the future.  No further intervention is needed.  Return to clinic in 6 weeks with repeat laboratory work and further evaluation.   Thrombocytopenia: Resolved.   Back/joint pain: Chronic and unchanged.  Continue oxycodone as prescribed.  Recent x-ray did not show any fracture.  Consider repeat imaging and referral to orthopedics if needed. Peripheral neuropathy: Patient reports this may be slightly improved.  Continue gabapentin as prescribed. Insomnia/anxiety: Chronic and unchanged.  Patient was given a refill of his Xanax today.   Hypotension: Resolved.  Proceed with 1 L of IV fluids today. Falls: Patient is not report any falls in the last 3 weeks. Hypokalemia: Patient received 20 mill equivalents IV potassium today.  Patient expressed understanding and was in agreement with this  plan. He also understands that He can call clinic at any time with any questions, concerns, or complaints.    Cancer Staging  No matching staging information was found for the patient.  Lloyd Huger, MD   03/12/2021 11:54 AM

## 2021-03-12 ENCOUNTER — Encounter: Payer: Self-pay | Admitting: Oncology

## 2021-03-12 ENCOUNTER — Other Ambulatory Visit: Payer: Medicare Other

## 2021-03-12 ENCOUNTER — Encounter: Payer: Self-pay | Admitting: Licensed Clinical Social Worker

## 2021-03-12 ENCOUNTER — Inpatient Hospital Stay: Payer: Medicare Other

## 2021-03-12 ENCOUNTER — Inpatient Hospital Stay: Payer: Medicare Other | Attending: Oncology

## 2021-03-12 ENCOUNTER — Ambulatory Visit: Payer: Medicare Other | Admitting: Oncology

## 2021-03-12 ENCOUNTER — Other Ambulatory Visit: Payer: Self-pay

## 2021-03-12 ENCOUNTER — Other Ambulatory Visit: Payer: Self-pay | Admitting: Oncology

## 2021-03-12 ENCOUNTER — Inpatient Hospital Stay (HOSPITAL_BASED_OUTPATIENT_CLINIC_OR_DEPARTMENT_OTHER): Payer: Medicare Other | Admitting: Oncology

## 2021-03-12 ENCOUNTER — Ambulatory Visit: Payer: Medicare Other

## 2021-03-12 VITALS — BP 119/51 | HR 56 | Temp 98.4°F | Wt 183.0 lb

## 2021-03-12 DIAGNOSIS — R296 Repeated falls: Secondary | ICD-10-CM | POA: Diagnosis not present

## 2021-03-12 DIAGNOSIS — C9000 Multiple myeloma not having achieved remission: Secondary | ICD-10-CM | POA: Diagnosis not present

## 2021-03-12 DIAGNOSIS — M25551 Pain in right hip: Secondary | ICD-10-CM | POA: Diagnosis not present

## 2021-03-12 DIAGNOSIS — E876 Hypokalemia: Secondary | ICD-10-CM | POA: Insufficient documentation

## 2021-03-12 DIAGNOSIS — D696 Thrombocytopenia, unspecified: Secondary | ICD-10-CM | POA: Diagnosis not present

## 2021-03-12 DIAGNOSIS — M549 Dorsalgia, unspecified: Secondary | ICD-10-CM | POA: Diagnosis not present

## 2021-03-12 DIAGNOSIS — M25552 Pain in left hip: Secondary | ICD-10-CM | POA: Diagnosis not present

## 2021-03-12 DIAGNOSIS — Z95828 Presence of other vascular implants and grafts: Secondary | ICD-10-CM

## 2021-03-12 LAB — CBC WITH DIFFERENTIAL/PLATELET
Abs Immature Granulocytes: 0.02 10*3/uL (ref 0.00–0.07)
Basophils Absolute: 0 10*3/uL (ref 0.0–0.1)
Basophils Relative: 0 %
Eosinophils Absolute: 0 10*3/uL (ref 0.0–0.5)
Eosinophils Relative: 0 %
HCT: 38.4 % — ABNORMAL LOW (ref 39.0–52.0)
Hemoglobin: 12.6 g/dL — ABNORMAL LOW (ref 13.0–17.0)
Immature Granulocytes: 0 %
Lymphocytes Relative: 33 %
Lymphs Abs: 2.1 10*3/uL (ref 0.7–4.0)
MCH: 30.7 pg (ref 26.0–34.0)
MCHC: 32.8 g/dL (ref 30.0–36.0)
MCV: 93.7 fL (ref 80.0–100.0)
Monocytes Absolute: 0.6 10*3/uL (ref 0.1–1.0)
Monocytes Relative: 9 %
Neutro Abs: 3.5 10*3/uL (ref 1.7–7.7)
Neutrophils Relative %: 58 %
Platelets: 160 10*3/uL (ref 150–400)
RBC: 4.1 MIL/uL — ABNORMAL LOW (ref 4.22–5.81)
RDW: 13.6 % (ref 11.5–15.5)
WBC: 6.2 10*3/uL (ref 4.0–10.5)
nRBC: 0 % (ref 0.0–0.2)

## 2021-03-12 LAB — COMPREHENSIVE METABOLIC PANEL
ALT: 13 U/L (ref 0–44)
AST: 17 U/L (ref 15–41)
Albumin: 3.1 g/dL — ABNORMAL LOW (ref 3.5–5.0)
Alkaline Phosphatase: 22 U/L — ABNORMAL LOW (ref 38–126)
Anion gap: 7 (ref 5–15)
BUN: 16 mg/dL (ref 8–23)
CO2: 29 mmol/L (ref 22–32)
Calcium: 8.1 mg/dL — ABNORMAL LOW (ref 8.9–10.3)
Chloride: 101 mmol/L (ref 98–111)
Creatinine, Ser: 0.8 mg/dL (ref 0.61–1.24)
GFR, Estimated: >60 mL/min (ref >60)
Glucose, Bld: 120 mg/dL — ABNORMAL HIGH (ref 70–99)
Potassium: 3.3 mmol/L — ABNORMAL LOW (ref 3.5–5.1)
Sodium: 137 mmol/L (ref 135–145)
Total Bilirubin: 0.5 mg/dL (ref 0.3–1.2)
Total Protein: 5.4 g/dL — ABNORMAL LOW (ref 6.5–8.1)

## 2021-03-12 LAB — MAGNESIUM: Magnesium: 2.3 mg/dL (ref 1.7–2.4)

## 2021-03-12 MED ORDER — SODIUM CHLORIDE 0.9 % IV SOLN
INTRAVENOUS | Status: DC
  Filled 2021-03-12 (×2): qty 250

## 2021-03-12 MED ORDER — SODIUM CHLORIDE 0.9% FLUSH
10.0000 mL | Freq: Once | INTRAVENOUS | Status: DC
  Filled 2021-03-12: qty 10

## 2021-03-12 MED ORDER — HEPARIN SOD (PORK) LOCK FLUSH 100 UNIT/ML IV SOLN
500.0000 [IU] | Freq: Once | INTRAVENOUS | Status: AC
  Administered 2021-03-12: 500 [IU] via INTRAVENOUS
  Filled 2021-03-12: qty 5

## 2021-03-12 MED ORDER — POTASSIUM CHLORIDE 20 MEQ/100ML IV SOLN
20.0000 meq | Freq: Once | INTRAVENOUS | Status: AC
  Administered 2021-03-12: 20 meq via INTRAVENOUS

## 2021-03-12 MED ORDER — ALPRAZOLAM 1 MG PO TABS: 1.0000 mg | ORAL_TABLET | Freq: Three times a day (TID) | ORAL | 0 refills | Status: DC | PRN

## 2021-03-12 NOTE — Progress Notes (Signed)
Pt has had multiple falls in the next 3 months.  Lives with girlfriend who has dementia and he said things are getting hard at home and he doesn't know what to do. He said he needs help and doesn't know where to start.  He is having trouble sleeping at night.

## 2021-03-12 NOTE — Progress Notes (Addendum)
Nutrition Follow-up:  Patient with multiple myeloma.  Patient is currently not receiving treatment.    Met with patient while receiving fluids.  He says that his appetite has been a little bit better in the last few days.  Ate 1/2 steak biscuit this am before coming to the clinic.  He ate ham sandwich and drank a gatorade for dinner last night.  Patient says that he is receiving home PT.  Says that he has not been drinking his carnation instant breakfast. Says that his long time girlfriend with dementia hides them or opens them and puts them in the refrigerator.  He says that it is getting harder to take care of her.  He is unsure what to do to get help.      Medications: reviewed  Labs: reviewed  Anthropometrics:   Weight 183 lb today  176 lb 6.4 oz on 1/11 175 lb on 12/13 182 lb 12.8 oz on 11/15 184 lb on 10/11 172 lb on 9/28 178 lb on 9/12 179 lb on 8/30  NUTRITION DIAGNOSIS: Inadequate oral intake continues    INTERVENTION:  Strongly encouraged patient to drink carnation instant breakfast shakes BID MD agreeable to referral to LCSW placed for assistance     MONITORING, EVALUATION, GOAL: weight trends, intake   NEXT VISIT: Wed, March 22 after MD   Casidee Jann B. Zenia Resides, [place], [place] Registered Dietitian [phone] (mobile)

## 2021-03-12 NOTE — Addendum Note (Signed)
Addended by: Jennet Maduro B on: 03/12/2021 01:15 PM   Modules accepted: Orders

## 2021-03-13 ENCOUNTER — Encounter (HOSPITAL_COMMUNITY): Payer: Self-pay | Admitting: Hematology

## 2021-03-13 DIAGNOSIS — R296 Repeated falls: Secondary | ICD-10-CM | POA: Diagnosis not present

## 2021-03-13 DIAGNOSIS — M549 Dorsalgia, unspecified: Secondary | ICD-10-CM | POA: Diagnosis not present

## 2021-03-13 DIAGNOSIS — C9000 Multiple myeloma not having achieved remission: Secondary | ICD-10-CM | POA: Diagnosis not present

## 2021-03-13 DIAGNOSIS — M25552 Pain in left hip: Secondary | ICD-10-CM | POA: Diagnosis not present

## 2021-03-13 DIAGNOSIS — D696 Thrombocytopenia, unspecified: Secondary | ICD-10-CM | POA: Diagnosis not present

## 2021-03-13 DIAGNOSIS — M25551 Pain in right hip: Secondary | ICD-10-CM | POA: Diagnosis not present

## 2021-03-13 LAB — IGG, IGA, IGM
IgA: 26 mg/dL — ABNORMAL LOW (ref 61–437)
IgG (Immunoglobin G), Serum: 721 mg/dL (ref 603–1613)
IgM (Immunoglobulin M), Srm: 18 mg/dL (ref 15–143)

## 2021-03-13 LAB — PROTEIN ELECTROPHORESIS, SERUM
A/G Ratio: 1.3 (ref 0.7–1.7)
Albumin ELP: 2.9 g/dL (ref 2.9–4.4)
Alpha-1-Globulin: 0.3 g/dL (ref 0.0–0.4)
Alpha-2-Globulin: 0.7 g/dL (ref 0.4–1.0)
Beta Globulin: 0.6 g/dL — ABNORMAL LOW (ref 0.7–1.3)
Gamma Globulin: 0.7 g/dL (ref 0.4–1.8)
Globulin, Total: 2.2 g/dL (ref 2.2–3.9)
M-Spike, %: 0.5 g/dL — ABNORMAL HIGH
Total Protein ELP: 5.1 g/dL — ABNORMAL LOW (ref 6.0–8.5)

## 2021-03-13 LAB — KAPPA/LAMBDA LIGHT CHAINS
Kappa free light chain: 38.2 mg/L — ABNORMAL HIGH (ref 3.3–19.4)
Kappa, lambda light chain ratio: 10.61 — ABNORMAL HIGH (ref 0.26–1.65)
Lambda free light chains: 3.6 mg/L — ABNORMAL LOW (ref 5.7–26.3)

## 2021-03-13 NOTE — Progress Notes (Signed)
[facility]  Initial Assessment   Rawlin Reaume. is a 86 y.o. year old male contacted by phone. Clinical Social Work was referred by  [name] for assessment of psychosocial needs.   SDOH (Social Determinants of Health) assessments performed: No   Distress Screen completed: No ONCBCN DISTRESS SCREENING 09/23/2017  Screening Type Initial Screening  Distress experienced in past week (1-10) 5  Emotional problem type Nervousness/Anxiety;Adjusting to illness;Boredom  Information Concerns Type Lack of info about treatment  Physical Problem type Pain;Sleep/insomnia;Getting around;Constipation/diarrhea;Tingling hands/feet;Swollen arms/legs  Physician notified of physical symptoms -  Referral to clinical social work -  Other [phone] or [phone]      Family/Social Information:  Housing Arrangement: patient lives with [place] Family members/support persons in your life? Family Transportation concerns: no  Employment: Retired. Income source: Paediatric nurse concerns: No Type of concern: None Food access concerns: no Religious or spiritual practice: N/A Services Currently in place:  Medicare  Coping/ Adjustment to diagnosis: Patient understands treatment plan and what happens next? 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 Patient enjoys  N/A Current coping skills/ strengths: Ability for insight , Average or above average intelligence , Capable of independent living , Financial means , and Supportive family/friends     SUMMARY: Current SDOH Barriers:  Family and relationship dysfunction and Limited access to caregiver  Clinical Social Work Clinical Goal(s):  patient will follow up with family and an attorney to discuss concerns about property* as  directed by SW  Interventions: Discussed common feeling and emotions when being diagnosed with cancer, and the importance of support during treatment Informed patient of the support team roles and support services at [facility] Provided CSW contact information and encouraged patient to call with any questions or concerns Provided education regarding Medicaid criteria, SNF, long-term care, ALF, home health and private care services.  CSW recommended the patient speak with his family and an attorney in reference to his concerns over financial aspects of his care and the care of his SO.   CSW also spoke with patient's daughter Gillermina Phy who stated she has already discuss the patient's concerns with an attorney and they are in the process of secure financial assistance for th patient and his SO. CSW and Ms. Marliss Czar spoke about private care for the patient, who is having a difficult time at home due to recurrent falls. Ms. Marliss Czar stated the patient is currently receiving home health PT and the PT has encouraged the patient to use a walker. Ms. Marliss Czar stated her biggest concern is the patient's lack of appetite and inquired about a referral to Meals on Wheels.  CSW stated I would make referral for the patient.   Follow Up Plan: Patient will contact CSW with any support or resource needs Patient verbalizes understanding of plan: Yes    Shadie Sweatman , LCSW

## 2021-03-15 DIAGNOSIS — M549 Dorsalgia, unspecified: Secondary | ICD-10-CM | POA: Diagnosis not present

## 2021-03-15 DIAGNOSIS — C9000 Multiple myeloma not having achieved remission: Secondary | ICD-10-CM | POA: Diagnosis not present

## 2021-03-15 DIAGNOSIS — M25551 Pain in right hip: Secondary | ICD-10-CM | POA: Diagnosis not present

## 2021-03-15 DIAGNOSIS — D696 Thrombocytopenia, unspecified: Secondary | ICD-10-CM | POA: Diagnosis not present

## 2021-03-15 DIAGNOSIS — R296 Repeated falls: Secondary | ICD-10-CM | POA: Diagnosis not present

## 2021-03-15 DIAGNOSIS — M25552 Pain in left hip: Secondary | ICD-10-CM | POA: Diagnosis not present

## 2021-03-18 DIAGNOSIS — M549 Dorsalgia, unspecified: Secondary | ICD-10-CM | POA: Diagnosis not present

## 2021-03-18 DIAGNOSIS — M25552 Pain in left hip: Secondary | ICD-10-CM | POA: Diagnosis not present

## 2021-03-18 DIAGNOSIS — C9000 Multiple myeloma not having achieved remission: Secondary | ICD-10-CM | POA: Diagnosis not present

## 2021-03-18 DIAGNOSIS — D696 Thrombocytopenia, unspecified: Secondary | ICD-10-CM | POA: Diagnosis not present

## 2021-03-18 DIAGNOSIS — R296 Repeated falls: Secondary | ICD-10-CM | POA: Diagnosis not present

## 2021-03-18 DIAGNOSIS — M25551 Pain in right hip: Secondary | ICD-10-CM | POA: Diagnosis not present

## 2021-03-19 ENCOUNTER — Other Ambulatory Visit: Payer: Self-pay | Admitting: Oncology

## 2021-03-19 DIAGNOSIS — D696 Thrombocytopenia, unspecified: Secondary | ICD-10-CM | POA: Diagnosis not present

## 2021-03-19 DIAGNOSIS — M25552 Pain in left hip: Secondary | ICD-10-CM | POA: Diagnosis not present

## 2021-03-19 DIAGNOSIS — R296 Repeated falls: Secondary | ICD-10-CM | POA: Diagnosis not present

## 2021-03-19 DIAGNOSIS — G4700 Insomnia, unspecified: Secondary | ICD-10-CM

## 2021-03-19 DIAGNOSIS — C9000 Multiple myeloma not having achieved remission: Secondary | ICD-10-CM | POA: Diagnosis not present

## 2021-03-19 DIAGNOSIS — M549 Dorsalgia, unspecified: Secondary | ICD-10-CM | POA: Diagnosis not present

## 2021-03-19 DIAGNOSIS — M25551 Pain in right hip: Secondary | ICD-10-CM | POA: Diagnosis not present

## 2021-03-22 DIAGNOSIS — C9000 Multiple myeloma not having achieved remission: Secondary | ICD-10-CM | POA: Diagnosis not present

## 2021-03-22 DIAGNOSIS — M25552 Pain in left hip: Secondary | ICD-10-CM | POA: Diagnosis not present

## 2021-03-22 DIAGNOSIS — M25551 Pain in right hip: Secondary | ICD-10-CM | POA: Diagnosis not present

## 2021-03-22 DIAGNOSIS — D696 Thrombocytopenia, unspecified: Secondary | ICD-10-CM | POA: Diagnosis not present

## 2021-03-22 DIAGNOSIS — M549 Dorsalgia, unspecified: Secondary | ICD-10-CM | POA: Diagnosis not present

## 2021-03-22 DIAGNOSIS — R296 Repeated falls: Secondary | ICD-10-CM | POA: Diagnosis not present

## 2021-03-24 DIAGNOSIS — C9000 Multiple myeloma not having achieved remission: Secondary | ICD-10-CM | POA: Diagnosis not present

## 2021-03-24 DIAGNOSIS — G629 Polyneuropathy, unspecified: Secondary | ICD-10-CM | POA: Diagnosis not present

## 2021-03-24 DIAGNOSIS — G4700 Insomnia, unspecified: Secondary | ICD-10-CM | POA: Diagnosis not present

## 2021-03-24 DIAGNOSIS — Z9181 History of falling: Secondary | ICD-10-CM | POA: Diagnosis not present

## 2021-03-24 DIAGNOSIS — M25552 Pain in left hip: Secondary | ICD-10-CM | POA: Diagnosis not present

## 2021-03-24 DIAGNOSIS — K219 Gastro-esophageal reflux disease without esophagitis: Secondary | ICD-10-CM | POA: Diagnosis not present

## 2021-03-24 DIAGNOSIS — I959 Hypotension, unspecified: Secondary | ICD-10-CM | POA: Diagnosis not present

## 2021-03-24 DIAGNOSIS — D696 Thrombocytopenia, unspecified: Secondary | ICD-10-CM | POA: Diagnosis not present

## 2021-03-24 DIAGNOSIS — N400 Enlarged prostate without lower urinary tract symptoms: Secondary | ICD-10-CM | POA: Diagnosis not present

## 2021-03-24 DIAGNOSIS — M25551 Pain in right hip: Secondary | ICD-10-CM | POA: Diagnosis not present

## 2021-03-24 DIAGNOSIS — R296 Repeated falls: Secondary | ICD-10-CM | POA: Diagnosis not present

## 2021-03-24 DIAGNOSIS — Z79891 Long term (current) use of opiate analgesic: Secondary | ICD-10-CM | POA: Diagnosis not present

## 2021-03-24 DIAGNOSIS — F419 Anxiety disorder, unspecified: Secondary | ICD-10-CM | POA: Diagnosis not present

## 2021-03-24 DIAGNOSIS — K5790 Diverticulosis of intestine, part unspecified, without perforation or abscess without bleeding: Secondary | ICD-10-CM | POA: Diagnosis not present

## 2021-03-24 DIAGNOSIS — M549 Dorsalgia, unspecified: Secondary | ICD-10-CM | POA: Diagnosis not present

## 2021-03-25 DIAGNOSIS — R296 Repeated falls: Secondary | ICD-10-CM | POA: Diagnosis not present

## 2021-03-25 DIAGNOSIS — D696 Thrombocytopenia, unspecified: Secondary | ICD-10-CM | POA: Diagnosis not present

## 2021-03-25 DIAGNOSIS — C9000 Multiple myeloma not having achieved remission: Secondary | ICD-10-CM | POA: Diagnosis not present

## 2021-03-25 DIAGNOSIS — M549 Dorsalgia, unspecified: Secondary | ICD-10-CM | POA: Diagnosis not present

## 2021-03-25 DIAGNOSIS — M25552 Pain in left hip: Secondary | ICD-10-CM | POA: Diagnosis not present

## 2021-03-25 DIAGNOSIS — M25551 Pain in right hip: Secondary | ICD-10-CM | POA: Diagnosis not present

## 2021-03-26 ENCOUNTER — Other Ambulatory Visit: Payer: Self-pay

## 2021-03-26 ENCOUNTER — Ambulatory Visit (INDEPENDENT_AMBULATORY_CARE_PROVIDER_SITE_OTHER): Payer: Medicare Other | Admitting: Family Medicine

## 2021-03-26 VITALS — BP 122/82 | HR 102 | Temp 97.7°F | Resp 18 | Ht 68.0 in | Wt 172.0 lb

## 2021-03-26 DIAGNOSIS — R300 Dysuria: Secondary | ICD-10-CM

## 2021-03-26 LAB — URINALYSIS, ROUTINE W REFLEX MICROSCOPIC
Bilirubin Urine: NEGATIVE
Glucose, UA: NEGATIVE
Hgb urine dipstick: NEGATIVE
Ketones, ur: NEGATIVE
Leukocytes,Ua: NEGATIVE
Nitrite: NEGATIVE
Protein, ur: NEGATIVE
Specific Gravity, Urine: 1.015 (ref 1.001–1.035)
pH: 7 (ref 5.0–8.0)

## 2021-03-26 LAB — CBC WITH DIFFERENTIAL/PLATELET
Absolute Monocytes: 570 cells/uL (ref 200–950)
Basophils Absolute: 8 cells/uL (ref 0–200)
Basophils Relative: 0.1 %
Eosinophils Absolute: 8 cells/uL — ABNORMAL LOW (ref 15–500)
Eosinophils Relative: 0.1 %
HCT: 43.4 % (ref 38.5–50.0)
Hemoglobin: 14.3 g/dL (ref 13.2–17.1)
Lymphs Abs: 1406 cells/uL (ref 850–3900)
MCH: 30.6 pg (ref 27.0–33.0)
MCHC: 32.9 g/dL (ref 32.0–36.0)
MCV: 92.9 fL (ref 80.0–100.0)
MPV: 11.5 fL (ref 7.5–12.5)
Monocytes Relative: 7.5 %
Neutro Abs: 5609 cells/uL (ref 1500–7800)
Neutrophils Relative %: 73.8 %
Platelets: 138 10*3/uL — ABNORMAL LOW (ref 140–400)
RBC: 4.67 10*6/uL (ref 4.20–5.80)
RDW: 14.2 % (ref 11.0–15.0)
Total Lymphocyte: 18.5 %
WBC: 7.6 10*3/uL (ref 3.8–10.8)

## 2021-03-26 LAB — COMPLETE METABOLIC PANEL WITH GFR
AG Ratio: 2.3 (calc) (ref 1.0–2.5)
ALT: 14 U/L (ref 9–46)
AST: 12 U/L (ref 10–35)
Albumin: 3.7 g/dL (ref 3.6–5.1)
Alkaline phosphatase (APISO): 29 U/L — ABNORMAL LOW (ref 35–144)
BUN: 22 mg/dL (ref 7–25)
CO2: 27 mmol/L (ref 20–32)
Calcium: 8.8 mg/dL (ref 8.6–10.3)
Chloride: 106 mmol/L (ref 98–110)
Creat: 0.81 mg/dL (ref 0.70–1.22)
Globulin: 1.6 g/dL (calc) — ABNORMAL LOW (ref 1.9–3.7)
Glucose, Bld: 98 mg/dL (ref 65–99)
Potassium: 4 mmol/L (ref 3.5–5.3)
Sodium: 140 mmol/L (ref 135–146)
Total Bilirubin: 0.8 mg/dL (ref 0.2–1.2)
Total Protein: 5.3 g/dL — ABNORMAL LOW (ref 6.1–8.1)
eGFR: 87 mL/min/{1.73_m2} (ref >=60)

## 2021-03-26 MED ORDER — CEPHALEXIN 500 MG PO CAPS
500.0000 mg | ORAL_CAPSULE | Freq: Three times a day (TID) | ORAL | 0 refills | Status: DC
Start: 2021-03-26 — End: 2021-11-11

## 2021-03-26 NOTE — Progress Notes (Signed)
Subjective:    Patient ID: Dean [name]., male    DOB: 1935-12-04, 86 y.o.   MRN: [id number]  HPI 03/04/21 Please see the patient's office visit from last week.  At that time he was diagnosed with a urinary tract infection and was started on Cipro.  He was also hypotensive.  Patient is here today for follow-up.  He is very pleasant 86 year old gentleman with a history of multiple myeloma.  He states that he feels weak and tired.  I am unable to get his blood pressure today.  He denies any orthostatic dizziness.  There is no tachycardia.  However his blood pressure is difficult to auscultate.  I believe that his systolic pressure is around 92 and his diastolic blood pressure is around 50 but is very difficult to hear.  He denies any chest pain or shortness of breath.  There is no tachycardia.  However he has broke out into a rash on both legs.  The rash is a widespread erythematous macular rash.  There are numerous target-like lesions.  I believe the patient has developed erythema multiforme likely secondary to the Cipro versus early Stevens-Johnson syndrome.  There is no mucosal involvement.  There are no blisters around the lips of the tongue or the rectum or penis.  He denies any trouble breathing or swallowing.  At that time, my plan was: Patient refuses to go to the hospital [place].  I will try to treat him as best I can.  He states that he prefers to be at home even if it means dying at home.  I will give him 1 g of Rocephin.  Culture returned showing resistance to Cipro and both the E. coli and staph epidermis.  Therefore I will switch to Keflex 500 mg p.o. 3 times daily.  Recheck the patient tomorrow.  Encouraged him to go to the emergency room if worsening.  I will give him 1 g of Rocephin now and also 60 mg of Depo-Medrol.  I encouraged him to stop the Cipro immediately as this is the cause of the allergic reaction.  03/05/21 Fortunately, his rash on his leg has faded  considerably from yesterday.  Itching is much better.  He feels stronger.  He states that the dysuria has improved and is now mild.  His blood pressure is better.  It is still extremely low but at least we can easily record it now.  He denies any fainting.  He denies any syncope or chest pain or shortness of breath.  He denies any fevers or chills.  He denies any itching or blisters in his mouth or throat or anus or on his penis.  At that time, my plan was:  Thankfully, everything seems better today.  Therefore I feel that the patient simply needs to complete the Keflex at home.  This should resolve the cystitis.  The erythema multiforme has not spread or progressed.  We will continue to refrain from using Cipro and I will put him on prednisone 40 mg a day for 5 days.  Patient is still hypotensive.  I encouraged him to push Gatorade and fluids.  He is also having some cramping so we will check a BMP to see if he requires potassium.  If labs are normal, I plan to follow-up with the patient on Friday for recheck sooner if worsening.  03/26/21 Patient started to feel better so he stopped his antibiotics.  He did not complete them.  Over the last  4 days he has developed recurrent dysuria, fevers, chills, weakness.  He denies any chest pain or shortness of breath or cough.  Urinalysis today shows no blood, no nitrates, no leukocyte esterase.  However the patient continues to report dysuria and frequency and pelvic discomfort.  He still has antibiotics remaining but he never took. Past Medical History:  Diagnosis Date   BPH (benign prostatic hyperplasia)    Colonic diverticular abscess 01/08/2015   Diverticulitis 16/10/96   complicated by abscess and required percutaneous drainage   GERD (gastroesophageal reflux disease)    H/O ETOH abuse    History of chemotherapy last done jan 2017   History of radiation therapy 01/05/13-02/10/13   45 gray to left occipital condyle region   History of radiation therapy  12/01/16-12/10/16   Parasternal nodule, chest- 24 Gy total delivered in 8 fractions, Left sacro-iliac, pelvis- 24 Gy total delivered in 8 fractions    History of radiation therapy 02/19/17-02/22/17   right temporal scalp 30 Gy in 10 fractions   Intra-abdominal abscess ([facility])    Multiple myeloma ([place]) 2015   Neuropathy    Radiation 02/21/14-03/08/14   right posterior chest wall area 30 gray   Radiation 04/19/14-05/02/14   lumbar spine 25 gray   Skull lesion    Left occipital condyle   Wrist fracture, left    x 2   Past Surgical History:  Procedure Laterality Date   COLONOSCOPY N/A 04/19/2015   Procedure: COLONOSCOPY;  Surgeon: Danie Binder, MD;  Location: [facility];  Service: Endoscopy;  Laterality: N/A;  1:30 PM   CYST EXCISION  1959   tail bone   IR IMAGING GUIDED PORT INSERTION  09/25/2017   Current Outpatient Medications on File Prior to Visit  Medication Sig Dispense Refill   acyclovir (ZOVIRAX) 400 MG tablet TAKE 1 TABLET BY MOUTH TWICE A DAY 60 tablet 6   ALPRAZolam (XANAX) 1 MG tablet Take 1 tablet (1 mg total) by mouth 3 (three) times daily as needed for anxiety. 90 tablet 0   CALCIUM-VITAMIN D PO Take 1 tablet by mouth 2 (two) times daily.      dexamethasone (DECADRON) 4 MG tablet      fluticasone (FLONASE) 50 MCG/ACT nasal spray Place 2 sprays into both nostrils daily. 16 g 6   furosemide (LASIX) 20 MG tablet TAKE 1 TABLET BY MOUTH ONCE A DAY AS NEEDED 30 tablet 3   gabapentin (NEURONTIN) 400 MG capsule TAKE 1 CAPSULE BY MOUTH 3 TIMES A DAY 90 capsule 4   Homeopathic Products (THERAWORX RELIEF EX) Apply topically as needed.     ibuprofen (ADVIL) 600 MG tablet Take 1 tablet (600 mg total) by mouth every 8 (eight) hours as needed. 30 tablet 0   lidocaine-prilocaine (EMLA) cream Apply small amount over port one (1) hour prior to appointment. 30 g 0   montelukast (SINGULAIR) 10 MG tablet TAKE 1 TABLET BY MOUTH EVERY NIGHT AT BEDTIME 30 tablet 3   omeprazole (PRILOSEC) 20 MG  capsule TAKE 1 CAPSULE BY MOUTH ONCE DAILY 30 capsule 5   oxybutynin (DITROPAN-XL) 10 MG 24 hr tablet TAKE 1 TABLET BY MOUTH EVERY NIGHT AT BEDTIME 30 tablet 1   Oxycodone HCl 10 MG TABS TAKE 1 TO 2 TABLETS (10-20MG) BY MOUTH 3TIMES DAILY AS NEEDED. 90 tablet 0   predniSONE (DELTASONE) 20 MG tablet Take 2 tablets (40 mg total) by mouth daily with breakfast. 10 tablet 0   senna-docusate (SENOKOT-S) 8.6-50 MG tablet Take 1 tablet  by mouth 2 (two) times daily. 60 tablet 5   silver sulfADIAZINE (SILVADENE) 1 % cream Apply 1 application topically daily. 50 g 0   tamsulosin (FLOMAX) 0.4 MG CAPS capsule TAKE ONE CAPSULE BY MOUTH EVERY NIGHT ATBEDTIME 30 capsule 11   traZODone (DESYREL) 100 MG tablet TAKE 3 TABLETS BY MOUTH AT BEDTIME 90 tablet 1   triamcinolone cream (KENALOG) 0.1 % APPLY 1 APPLICATION TOPICALLY TWICE A DAY 30 g 0   TURMERIC PO Take by mouth.     zolendronic acid (ZOMETA) 4 MG/5ML injection Inject 4 mg into the vein every 8 (eight) weeks.     Current Facility-Administered Medications on File Prior to Visit  Medication Dose Route Frequency Provider Last Rate Last Admin   0.9 %  sodium chloride infusion   Intravenous Once Noemi Chapel A, NP       heparin lock flush 100 unit/mL  500 Units Intravenous Once Lloyd Huger, MD       heparin lock flush 100 unit/mL  500 Units Intracatheter Once PRN Lloyd Huger, MD       Allergies  Allergen Reactions   Codeine Other (See Comments)    Headache   Morphine And Related Other (See Comments)    Makes him feel weird   Revlimid [Lenalidomide] Other (See Comments)    "Causes me to become weak"   Social History   Socioeconomic History   Marital status: Married    Spouse name: Not on file   Number of children: 3   Years of education: Not on file   Highest education level: Not on file  Occupational History    Employer: RETIRED  Tobacco Use   Smoking status: Never   Smokeless tobacco: Never  Vaping Use   Vaping Use:  Never used  Substance and Sexual Activity   Alcohol use: No    Comment: " Not much no more"   Drug use: No   Sexual activity: Not on file  Other Topics Concern   Not on file  Social History Narrative   Not on file   Social Determinants of Health   Financial Resource Strain: Not on file  Food Insecurity: Not on file  Transportation Needs: Not on file  Physical Activity: Not on file  Stress: Not on file  Social Connections: Not on file  Intimate Partner Violence: Not on file      Review of Systems     Objective:   Physical Exam Vitals reviewed.  Constitutional:      General: He is not in acute distress.    Appearance: He is obese. He is not ill-appearing or toxic-appearing.  Cardiovascular:     Rate and Rhythm: Normal rate and regular rhythm.     Pulses: Normal pulses.     Heart sounds: Normal heart sounds.  Pulmonary:     Effort: Pulmonary effort is normal. No respiratory distress.     Breath sounds: Normal breath sounds. No stridor. No wheezing, rhonchi or rales.  Abdominal:     General: Abdomen is flat. Bowel sounds are normal.     Palpations: Abdomen is soft.  Skin:    Findings: No erythema or rash.  Neurological:     General: No focal deficit present.     Mental Status: He is alert and oriented to person, place, and time.     Cranial Nerves: No cranial nerve deficit.     Motor: No weakness.          Assessment &  Plan:  Dysuria - Plan: CBC with Differential/Platelet, COMPLETE METABOLIC PANEL WITH GFR, CULTURE, URINE COMPREHENSIVE, Urinalysis, Routine w reflex microscopic, Urinalysis, Routine w reflex microscopic  Patient is extremely frail.  I will send a urine culture however his symptoms suggest that he has an incompletely treated infection possibly prostatitis.  Therefore I will start the patient back on Keflex 500 mg p.o. 3 times daily and await the results of his urine culture.  Also check CBC and CMP as well.  The remainder of his exam is normal.

## 2021-03-28 DIAGNOSIS — D696 Thrombocytopenia, unspecified: Secondary | ICD-10-CM | POA: Diagnosis not present

## 2021-03-28 DIAGNOSIS — R296 Repeated falls: Secondary | ICD-10-CM | POA: Diagnosis not present

## 2021-03-28 DIAGNOSIS — C9000 Multiple myeloma not having achieved remission: Secondary | ICD-10-CM | POA: Diagnosis not present

## 2021-03-28 DIAGNOSIS — M549 Dorsalgia, unspecified: Secondary | ICD-10-CM | POA: Diagnosis not present

## 2021-03-28 DIAGNOSIS — M25551 Pain in right hip: Secondary | ICD-10-CM | POA: Diagnosis not present

## 2021-03-28 DIAGNOSIS — M25552 Pain in left hip: Secondary | ICD-10-CM | POA: Diagnosis not present

## 2021-03-28 LAB — CULTURE, URINE COMPREHENSIVE
MICRO NUMBER:: 13036734
RESULT:: NO GROWTH
SPECIMEN QUALITY:: ADEQUATE

## 2021-04-03 DIAGNOSIS — M25551 Pain in right hip: Secondary | ICD-10-CM | POA: Diagnosis not present

## 2021-04-03 DIAGNOSIS — D696 Thrombocytopenia, unspecified: Secondary | ICD-10-CM | POA: Diagnosis not present

## 2021-04-03 DIAGNOSIS — M25552 Pain in left hip: Secondary | ICD-10-CM | POA: Diagnosis not present

## 2021-04-03 DIAGNOSIS — M549 Dorsalgia, unspecified: Secondary | ICD-10-CM | POA: Diagnosis not present

## 2021-04-03 DIAGNOSIS — C9000 Multiple myeloma not having achieved remission: Secondary | ICD-10-CM | POA: Diagnosis not present

## 2021-04-03 DIAGNOSIS — R296 Repeated falls: Secondary | ICD-10-CM | POA: Diagnosis not present

## 2021-04-08 ENCOUNTER — Other Ambulatory Visit: Payer: Self-pay | Admitting: Oncology

## 2021-04-08 ENCOUNTER — Other Ambulatory Visit: Payer: Self-pay | Admitting: Family Medicine

## 2021-04-08 ENCOUNTER — Other Ambulatory Visit: Payer: Self-pay | Admitting: Nurse Practitioner

## 2021-04-08 DIAGNOSIS — G4700 Insomnia, unspecified: Secondary | ICD-10-CM

## 2021-04-11 DIAGNOSIS — C9000 Multiple myeloma not having achieved remission: Secondary | ICD-10-CM | POA: Diagnosis not present

## 2021-04-11 DIAGNOSIS — M549 Dorsalgia, unspecified: Secondary | ICD-10-CM | POA: Diagnosis not present

## 2021-04-11 DIAGNOSIS — M25551 Pain in right hip: Secondary | ICD-10-CM | POA: Diagnosis not present

## 2021-04-11 DIAGNOSIS — M25552 Pain in left hip: Secondary | ICD-10-CM | POA: Diagnosis not present

## 2021-04-11 DIAGNOSIS — D696 Thrombocytopenia, unspecified: Secondary | ICD-10-CM | POA: Diagnosis not present

## 2021-04-11 DIAGNOSIS — R296 Repeated falls: Secondary | ICD-10-CM | POA: Diagnosis not present

## 2021-04-12 ENCOUNTER — Other Ambulatory Visit: Payer: Self-pay | Admitting: Oncology

## 2021-04-12 DIAGNOSIS — M25551 Pain in right hip: Secondary | ICD-10-CM | POA: Diagnosis not present

## 2021-04-12 DIAGNOSIS — M25552 Pain in left hip: Secondary | ICD-10-CM | POA: Diagnosis not present

## 2021-04-12 DIAGNOSIS — M549 Dorsalgia, unspecified: Secondary | ICD-10-CM | POA: Diagnosis not present

## 2021-04-12 DIAGNOSIS — R296 Repeated falls: Secondary | ICD-10-CM | POA: Diagnosis not present

## 2021-04-12 DIAGNOSIS — C9000 Multiple myeloma not having achieved remission: Secondary | ICD-10-CM

## 2021-04-12 DIAGNOSIS — D696 Thrombocytopenia, unspecified: Secondary | ICD-10-CM | POA: Diagnosis not present

## 2021-04-18 DIAGNOSIS — M549 Dorsalgia, unspecified: Secondary | ICD-10-CM | POA: Diagnosis not present

## 2021-04-18 DIAGNOSIS — C9000 Multiple myeloma not having achieved remission: Secondary | ICD-10-CM | POA: Diagnosis not present

## 2021-04-18 DIAGNOSIS — R296 Repeated falls: Secondary | ICD-10-CM | POA: Diagnosis not present

## 2021-04-18 DIAGNOSIS — D696 Thrombocytopenia, unspecified: Secondary | ICD-10-CM | POA: Diagnosis not present

## 2021-04-18 DIAGNOSIS — M25551 Pain in right hip: Secondary | ICD-10-CM | POA: Diagnosis not present

## 2021-04-18 DIAGNOSIS — M25552 Pain in left hip: Secondary | ICD-10-CM | POA: Diagnosis not present

## 2021-04-21 NOTE — Progress Notes (Signed)
?[facility]  ?[phone] [phone] ? ?ID: Dean [name]. OB: 1936-01-27  MR#: [id number]  JFH#:[id number] ? ?Patient Care Team: ?[name], MD as PCP - General (Family Medicine) ?Gery Pray, MD as Consulting Physician (Radiation Oncology) ?Fields, Marga Melnick, MD (Inactive) as Consulting Physician (Gastroenterology) ?Michael Boston, MD as Consulting Physician (General Surgery) ? ?CHIEF COMPLAINT: Multiple myeloma ? ?INTERVAL HISTORY: Patient returns to clinic today for repeat laboratory work and further evaluation.  Although he feels significantly improved since discontinuing chemotherapy, he still has chronic weakness and fatigue.  He has had a recent fall, but blames this on clutter left about from his wife who is demented. He continues to have insomnia.  He has no neurologic complaints.  He denies any recent fevers or illnesses.  He has a good appetite and denies weight loss.  He has no chest pain, shortness of breath, cough, or hemoptysis.  He denies any nausea, vomiting, constipation, or diarrhea.  He has no urinary complaints.  Patient offers no further specific complaints today.   ? ?REVIEW OF SYSTEMS:   ?Review of Systems  ?Constitutional: Negative.  Negative for fever, malaise/fatigue and weight loss.  ?Respiratory: Negative.  Negative for cough, hemoptysis and shortness of breath.   ?Cardiovascular: Negative.  Negative for chest pain and leg swelling.  ?Gastrointestinal: Negative.  Negative for abdominal pain and nausea.  ?Genitourinary: Negative.  Negative for dysuria.  ?Musculoskeletal:  Positive for falls. Negative for back pain and joint pain.  ?Skin: Negative.  Negative for rash.  ?Neurological:  Positive for sensory change. Negative for dizziness, focal weakness, weakness and headaches.  ?Psychiatric/Behavioral:  The patient has insomnia. The patient is not nervous/anxious.   ? ?As per HPI. Otherwise, a complete review of systems is negative. ? ?PAST  MEDICAL HISTORY: ?Past Medical History:  ?Diagnosis Date  ? BPH (benign prostatic hyperplasia)   ? Colonic diverticular abscess 01/08/2015  ? Diverticulitis 12/22/14  ? complicated by abscess and required percutaneous drainage  ? GERD (gastroesophageal reflux disease)   ? H/O ETOH abuse   ? History of chemotherapy last done jan 2017  ? History of radiation therapy 01/05/13-02/10/13  ? 45 gray to left occipital condyle region  ? History of radiation therapy 12/01/16-12/10/16  ? Parasternal nodule, chest- 24 Gy total delivered in 8 fractions, Left sacro-iliac, pelvis- 24 Gy total delivered in 8 fractions   ? History of radiation therapy 02/19/17-02/22/17  ? right temporal scalp 30 Gy in 10 fractions  ? Intra-abdominal abscess ([place])   ? Multiple myeloma ([place]) 2015  ? Neuropathy   ? Radiation 02/21/14-03/08/14  ? right posterior chest wall area 30 gray  ? Radiation 04/19/14-05/02/14  ? lumbar spine 25 gray  ? Skull lesion   ? Left occipital condyle  ? Wrist fracture, left   ? x 2  ? ? ?PAST SURGICAL HISTORY: ?Past Surgical History:  ?Procedure Laterality Date  ? COLONOSCOPY N/A 04/19/2015  ? Procedure: COLONOSCOPY;  Surgeon: Danie Binder, MD;  Location[facility];  Service: Endoscopy;  Laterality: N/A;  1:30 PM  ? CYST EXCISION  1959  ? tail bone  ? IR IMAGING GUIDED PORT INSERTION  09/25/2017  ? ? ?FAMILY HISTORY: ?Family History  ?Problem Relation Age of Onset  ? Diabetes Mother   ? Stroke Mother   ? Hypertension Father   ? ? ?ADVANCED DIRECTIVES (Y/N):  N ? ?HEALTH MAINTENANCE: ?Social History  ? ?Tobacco Use  ? Smoking status: Never  ? Smokeless  tobacco: Never  ?Vaping Use  ? Vaping Use: Never used  ?Substance Use Topics  ? Alcohol use: No  ?  Comment: " Not much no more"  ? Drug use: No  ? ? ? Colonoscopy: ? PAP: ? Bone density: ? Lipid panel: ? ?Allergies  ?Allergen Reactions  ? Codeine Other (See Comments)  ?  Headache  ? Morphine And Related Other (See Comments)  ?  Makes him feel weird  ? Revlimid [Lenalidomide]  Other (See Comments)  ?  "Causes me to become weak"  ? ? ?Current Outpatient Medications  ?Medication Sig Dispense Refill  ? acyclovir (ZOVIRAX) 400 MG tablet TAKE 1 TABLET BY MOUTH TWICE A DAY 60 tablet 6  ? ALPRAZolam (XANAX) 1 MG tablet Take 1 tablet (1 mg total) by mouth 3 (three) times daily as needed for anxiety. 90 tablet 0  ? CALCIUM-VITAMIN D PO Take 1 tablet by mouth 2 (two) times daily.     ? dexamethasone (DECADRON) 4 MG tablet     ? fluticasone (FLONASE) 50 MCG/ACT nasal spray Place 2 sprays into both nostrils daily. 16 g 6  ? furosemide (LASIX) 20 MG tablet TAKE 1 TABLET BY MOUTH ONCE A DAY AS NEEDED 30 tablet 3  ? gabapentin (NEURONTIN) 400 MG capsule TAKE 1 CAPSULE BY MOUTH 3 TIMES A DAY 90 capsule 4  ? Homeopathic Products ([place]) Apply topically as needed.    ? ibuprofen (ADVIL) 600 MG tablet Take 1 tablet (600 mg total) by mouth every 8 (eight) hours as needed. 30 tablet 0  ? lidocaine-prilocaine (EMLA) cream Apply small amount over port one (1) hour prior to appointment. 30 g 0  ? montelukast (SINGULAIR) 10 MG tablet TAKE 1 TABLET BY MOUTH EVERY NIGHT AT BEDTIME 30 tablet 3  ? omeprazole (PRILOSEC) 20 MG capsule TAKE 1 CAPSULE BY MOUTH ONCE DAILY 30 capsule 5  ? oxybutynin (DITROPAN-XL) 10 MG 24 hr tablet TAKE 1 TABLET BY MOUTH EVERY NIGHT AT BEDTIME 30 tablet 1  ? Oxycodone HCl 10 MG TABS TAKE 1 TO 2 TABLETS BY MOUTH 3 TIMES DAILY AS NEEDED 90 tablet 0  ? silver sulfADIAZINE (SILVADENE) 1 % cream Apply 1 application topically daily. 50 g 0  ? tamsulosin (FLOMAX) 0.4 MG CAPS capsule TAKE ONE CAPSULE BY MOUTH EVERY NIGHT ATBEDTIME 30 capsule 11  ? traZODone (DESYREL) 100 MG tablet TAKE 3 TABLETS BY MOUTH AT BEDTIME 90 tablet 1  ? triamcinolone cream (KENALOG) 0.1 % APPLY 1 APPLICATION TOPICALLY TWICE A DAY 30 g 0  ? TURMERIC PO Take by mouth.    ? zolendronic acid (ZOMETA) 4 MG/5ML injection Inject 4 mg into the vein every 8 (eight) weeks.    ? cephALEXin (KEFLEX) 500 MG capsule Take  1 capsule (500 mg total) by mouth 3 (three) times daily. (Patient not taking: Reported on 04/24/2021) 42 capsule 0  ? predniSONE (DELTASONE) 20 MG tablet Take 2 tablets (40 mg total) by mouth daily with breakfast. (Patient not taking: Reported on 04/24/2021) 10 tablet 0  ? senna-docusate (SENOKOT-S) 8.6-50 MG tablet Take 1 tablet by mouth 2 (two) times daily. (Patient not taking: Reported on 04/24/2021) 60 tablet 5  ? ?Current Facility-Administered Medications  ?Medication Dose Route Frequency Provider Last Rate Last Admin  ? 0.9 %  sodium chloride infusion   Intravenous Once Eulogio Bear, NP      ? ?Facility-Administered Medications Ordered in Other Visits  ?Medication Dose Route Frequency Provider Last Rate Last Admin  ?  heparin lock flush 100 unit/mL  500 Units Intravenous Once Lloyd Huger, MD      ? heparin lock flush 100 unit/mL  500 Units Intracatheter Once PRN Lloyd Huger, MD      ? ? ?OBJECTIVE: ?Vitals:  ? 04/24/21 0959  ?BP: 135/75  ?Pulse: 84  ?Resp: 16  ?Temp: (!) 97 ?F (36.1 ?C)  ?SpO2: 98%  ? ?   Body mass index is 26.3 kg/m?Marland Kitchen    ECOG FS:1 - Symptomatic but completely ambulatory ? ?General: Well-developed, well-nourished, no acute distress.  Sitting in a wheelchair. ?Eyes: Pink conjunctiva, anicteric sclera. ?HEENT: Normocephalic, moist mucous membranes. ?Lungs: No audible wheezing or coughing. ?Heart: Regular rate and rhythm. ?Abdomen: Soft, nontender, no obvious distention. ?Musculoskeletal: No edema, cyanosis, or clubbing. ?Neuro: Alert, answering all questions appropriately. Cranial nerves grossly intact. ?Skin: No rashes or petechiae noted. ?Psych: Normal affect. ? ?LAB RESULTS: ? ?Lab Results  ?Component Value Date  ? NA 140 04/24/2021  ? K 4.0 04/24/2021  ? CL 105 04/24/2021  ? CO2 28 04/24/2021  ? GLUCOSE 106 (H) 04/24/2021  ? BUN 18 04/24/2021  ? CREATININE 0.77 04/24/2021  ? CALCIUM 8.8 (L) 04/24/2021  ? PROT 6.6 04/24/2021  ? ALBUMIN 3.4 (L) 04/24/2021  ? AST 19  04/24/2021  ? ALT 12 04/24/2021  ? ALKPHOS 24 (L) 04/24/2021  ? BILITOT 0.6 04/24/2021  ? GFRNONAA >60 04/24/2021  ? GFRAA >60 10/13/2019  ? ? ?Lab Results  ?Component Value Date  ? WBC 4.6 04/24/2021  ? NEUTRO

## 2021-04-23 DIAGNOSIS — C9000 Multiple myeloma not having achieved remission: Secondary | ICD-10-CM | POA: Diagnosis not present

## 2021-04-23 DIAGNOSIS — I959 Hypotension, unspecified: Secondary | ICD-10-CM | POA: Diagnosis not present

## 2021-04-23 DIAGNOSIS — Z7983 Long term (current) use of bisphosphonates: Secondary | ICD-10-CM | POA: Diagnosis not present

## 2021-04-23 DIAGNOSIS — G629 Polyneuropathy, unspecified: Secondary | ICD-10-CM | POA: Diagnosis not present

## 2021-04-23 DIAGNOSIS — K5790 Diverticulosis of intestine, part unspecified, without perforation or abscess without bleeding: Secondary | ICD-10-CM | POA: Diagnosis not present

## 2021-04-23 DIAGNOSIS — M25551 Pain in right hip: Secondary | ICD-10-CM | POA: Diagnosis not present

## 2021-04-23 DIAGNOSIS — M549 Dorsalgia, unspecified: Secondary | ICD-10-CM | POA: Diagnosis not present

## 2021-04-23 DIAGNOSIS — Z7951 Long term (current) use of inhaled steroids: Secondary | ICD-10-CM | POA: Diagnosis not present

## 2021-04-23 DIAGNOSIS — Z791 Long term (current) use of non-steroidal anti-inflammatories (NSAID): Secondary | ICD-10-CM | POA: Diagnosis not present

## 2021-04-23 DIAGNOSIS — Z79891 Long term (current) use of opiate analgesic: Secondary | ICD-10-CM | POA: Diagnosis not present

## 2021-04-23 DIAGNOSIS — G4700 Insomnia, unspecified: Secondary | ICD-10-CM | POA: Diagnosis not present

## 2021-04-23 DIAGNOSIS — K219 Gastro-esophageal reflux disease without esophagitis: Secondary | ICD-10-CM | POA: Diagnosis not present

## 2021-04-23 DIAGNOSIS — D696 Thrombocytopenia, unspecified: Secondary | ICD-10-CM | POA: Diagnosis not present

## 2021-04-23 DIAGNOSIS — Z8781 Personal history of (healed) traumatic fracture: Secondary | ICD-10-CM | POA: Diagnosis not present

## 2021-04-23 DIAGNOSIS — M25552 Pain in left hip: Secondary | ICD-10-CM | POA: Diagnosis not present

## 2021-04-23 DIAGNOSIS — F419 Anxiety disorder, unspecified: Secondary | ICD-10-CM | POA: Diagnosis not present

## 2021-04-23 DIAGNOSIS — Z87440 Personal history of urinary (tract) infections: Secondary | ICD-10-CM | POA: Diagnosis not present

## 2021-04-23 DIAGNOSIS — Z9181 History of falling: Secondary | ICD-10-CM | POA: Diagnosis not present

## 2021-04-23 DIAGNOSIS — R296 Repeated falls: Secondary | ICD-10-CM | POA: Diagnosis not present

## 2021-04-23 DIAGNOSIS — Z7952 Long term (current) use of systemic steroids: Secondary | ICD-10-CM | POA: Diagnosis not present

## 2021-04-23 DIAGNOSIS — N400 Enlarged prostate without lower urinary tract symptoms: Secondary | ICD-10-CM | POA: Diagnosis not present

## 2021-04-24 ENCOUNTER — Inpatient Hospital Stay: Payer: Medicare Other | Attending: Oncology

## 2021-04-24 ENCOUNTER — Other Ambulatory Visit: Payer: Self-pay

## 2021-04-24 ENCOUNTER — Encounter: Payer: Self-pay | Admitting: Oncology

## 2021-04-24 ENCOUNTER — Inpatient Hospital Stay (HOSPITAL_BASED_OUTPATIENT_CLINIC_OR_DEPARTMENT_OTHER): Payer: Medicare Other | Admitting: Oncology

## 2021-04-24 ENCOUNTER — Inpatient Hospital Stay: Payer: Medicare Other

## 2021-04-24 VITALS — BP 135/75 | HR 84 | Temp 97.0°F | Resp 16 | Ht 68.0 in | Wt 173.0 lb

## 2021-04-24 DIAGNOSIS — G4700 Insomnia, unspecified: Secondary | ICD-10-CM | POA: Diagnosis not present

## 2021-04-24 DIAGNOSIS — M2550 Pain in unspecified joint: Secondary | ICD-10-CM | POA: Diagnosis not present

## 2021-04-24 DIAGNOSIS — D696 Thrombocytopenia, unspecified: Secondary | ICD-10-CM | POA: Diagnosis not present

## 2021-04-24 DIAGNOSIS — C9000 Multiple myeloma not having achieved remission: Secondary | ICD-10-CM | POA: Diagnosis not present

## 2021-04-24 DIAGNOSIS — M549 Dorsalgia, unspecified: Secondary | ICD-10-CM | POA: Diagnosis not present

## 2021-04-24 DIAGNOSIS — F419 Anxiety disorder, unspecified: Secondary | ICD-10-CM | POA: Insufficient documentation

## 2021-04-24 DIAGNOSIS — G8929 Other chronic pain: Secondary | ICD-10-CM | POA: Diagnosis not present

## 2021-04-24 DIAGNOSIS — G629 Polyneuropathy, unspecified: Secondary | ICD-10-CM | POA: Insufficient documentation

## 2021-04-24 LAB — COMPREHENSIVE METABOLIC PANEL
ALT: 12 U/L (ref 0–44)
AST: 19 U/L (ref 15–41)
Albumin: 3.4 g/dL — ABNORMAL LOW (ref 3.5–5.0)
Alkaline Phosphatase: 24 U/L — ABNORMAL LOW (ref 38–126)
Anion gap: 7 (ref 5–15)
BUN: 18 mg/dL (ref 8–23)
CO2: 28 mmol/L (ref 22–32)
Calcium: 8.8 mg/dL — ABNORMAL LOW (ref 8.9–10.3)
Chloride: 105 mmol/L (ref 98–111)
Creatinine, Ser: 0.77 mg/dL (ref 0.61–1.24)
GFR, Estimated: >60 mL/min (ref >60)
Glucose, Bld: 106 mg/dL — ABNORMAL HIGH (ref 70–99)
Potassium: 4 mmol/L (ref 3.5–5.1)
Sodium: 140 mmol/L (ref 135–145)
Total Bilirubin: 0.6 mg/dL (ref 0.3–1.2)
Total Protein: 6.6 g/dL (ref 6.5–8.1)

## 2021-04-24 LAB — CBC WITH DIFFERENTIAL/PLATELET
Abs Immature Granulocytes: 0.01 10*3/uL (ref 0.00–0.07)
Basophils Absolute: 0 10*3/uL (ref 0.0–0.1)
Basophils Relative: 0 %
Eosinophils Absolute: 0 10*3/uL (ref 0.0–0.5)
Eosinophils Relative: 1 %
HCT: 40.1 % (ref 39.0–52.0)
Hemoglobin: 13.2 g/dL (ref 13.0–17.0)
Immature Granulocytes: 0 %
Lymphocytes Relative: 31 %
Lymphs Abs: 1.4 10*3/uL (ref 0.7–4.0)
MCH: 30.8 pg (ref 26.0–34.0)
MCHC: 32.9 g/dL (ref 30.0–36.0)
MCV: 93.5 fL (ref 80.0–100.0)
Monocytes Absolute: 0.5 10*3/uL (ref 0.1–1.0)
Monocytes Relative: 12 %
Neutro Abs: 2.6 10*3/uL (ref 1.7–7.7)
Neutrophils Relative %: 56 %
Platelets: 112 10*3/uL — ABNORMAL LOW (ref 150–400)
RBC: 4.29 MIL/uL (ref 4.22–5.81)
RDW: 14.2 % (ref 11.5–15.5)
WBC: 4.6 10*3/uL (ref 4.0–10.5)
nRBC: 0 % (ref 0.0–0.2)

## 2021-04-24 LAB — MAGNESIUM: Magnesium: 2 mg/dL (ref 1.7–2.4)

## 2021-04-24 NOTE — Progress Notes (Signed)
Nutrition Follow-up: ? ?Patient with multiple myeloma.  Patient currently not receiving treatment ? ?Met with patient following MD visit.  Patient reports that appetite is about 50%.  Says that he has been worried about significant other and trying to take care of her with dementia has become difficult.  Family is trying to help him.  Says that he has plenty of food at home to eat, just does not want much of it.  Likes carnation breakfast essentials shake and a shake his daughter got him at [employer].  Denies nausea or constipation or diarrhea ? ?Medications: reviewed in chart ? ?Labs: reviewed ? ?Nutrition Focused Physical Exam:   ? ?Orbital Region: mild ?Buccal Region: mild ?Tricep: mild ?Midaxillary Line at Iliac crest: WNL ?Temporalis: moderate ?Clavicle Region: not examined (shirt and overalls on) ?Deltoid Region: mild ?Scapula Region: mild ?Dorsal Hand Region: mild ?Quadriceps: mild ?Calf Region: WNL ?No edema noted ?Hair: WNL ?Eyes: WNL ?Mouth: WNL ?Skin: WNL ? ?Anthropometrics:  ? ?Weight 173 lb today ? ?183 lb on 2/7 ? ?176 lb 1/11 ?175 lb on 12/13 ?182 lb on 11/15 ?184 lb on 10/11 ? ?5% weight loss in the last month and half, concerning ? ?Estimated energy needs ? ?Calories:  1975-2300 ?Protein: 98-115 g ?Fluid: 1975-2342m ? ?NUTRITION DIAGNOSIS: Inadequate oral intake related to poor appetite as evidenced by mild muscle mass loss and fat loss, 5% weight loss in the last month and half and estimated energy intake < 75% of estimated energy needs ? ? ?MALNUTRITION DIAGNOSIS: Patient meets criteria for moderate malnutrition in the context of social and environmental circumstances with caregiver for significant other with dementia and likely chronic illness with multiple myeloma as evidenced by mild muscle loss and mild fat loss ? ? ?INTERVENTION:  ?Recommend oral nutrition supplement TID ?Patient may benefit from trial of appetite stimulant.   ?Encouraged daily MVI ? ?  ? ?MONITORING, EVALUATION, GOAL: weight  trends, intake ? ? ?NEXT VISIT: Thursday, May 25 after MD visit ? ?Kenyana Husak B. AZenia Resides RD, LDN ?Registered Dietitian ?[phone]? ? ?

## 2021-04-25 ENCOUNTER — Encounter (HOSPITAL_COMMUNITY): Payer: Self-pay | Admitting: Hematology

## 2021-04-25 ENCOUNTER — Ambulatory Visit (INDEPENDENT_AMBULATORY_CARE_PROVIDER_SITE_OTHER): Payer: Medicare Other

## 2021-04-25 VITALS — Ht 68.0 in | Wt 173.0 lb

## 2021-04-25 DIAGNOSIS — M25552 Pain in left hip: Secondary | ICD-10-CM | POA: Diagnosis not present

## 2021-04-25 DIAGNOSIS — R296 Repeated falls: Secondary | ICD-10-CM | POA: Diagnosis not present

## 2021-04-25 DIAGNOSIS — Z0001 Encounter for general adult medical examination with abnormal findings: Secondary | ICD-10-CM | POA: Diagnosis not present

## 2021-04-25 DIAGNOSIS — M25551 Pain in right hip: Secondary | ICD-10-CM | POA: Diagnosis not present

## 2021-04-25 DIAGNOSIS — D696 Thrombocytopenia, unspecified: Secondary | ICD-10-CM | POA: Diagnosis not present

## 2021-04-25 DIAGNOSIS — Z0000 Encounter for general adult medical examination without abnormal findings: Secondary | ICD-10-CM

## 2021-04-25 DIAGNOSIS — M549 Dorsalgia, unspecified: Secondary | ICD-10-CM | POA: Diagnosis not present

## 2021-04-25 DIAGNOSIS — C9000 Multiple myeloma not having achieved remission: Secondary | ICD-10-CM | POA: Diagnosis not present

## 2021-04-25 DIAGNOSIS — H539 Unspecified visual disturbance: Secondary | ICD-10-CM | POA: Diagnosis not present

## 2021-04-25 LAB — IGG, IGA, IGM
IgA: 36 mg/dL — ABNORMAL LOW (ref 61–437)
IgG (Immunoglobin G), Serum: 1523 mg/dL (ref 603–1613)
IgM (Immunoglobulin M), Srm: 18 mg/dL (ref 15–143)

## 2021-04-25 LAB — KAPPA/LAMBDA LIGHT CHAINS
Kappa free light chain: 75.1 mg/L — ABNORMAL HIGH (ref 3.3–19.4)
Kappa, lambda light chain ratio: 11.21 — ABNORMAL HIGH (ref 0.26–1.65)
Lambda free light chains: 6.7 mg/L (ref 5.7–26.3)

## 2021-04-25 NOTE — Progress Notes (Signed)
? ?Subjective:  ? Dean [name]. is a 86 y.o. male who presents for an Initial Medicare Annual Wellness Visit. ?Virtual Visit via Telephone Note ? ?I connected with  Dean [name]. on 04/25/21 at  2:00 PM EDT by telephone and verified that I am speaking with the correct person using two identifiers. ? ?Location: ?Patient: HOME ?Provider: [facility] ?Persons participating in the virtual visit: patient/Nurse Health Advisor ?  ?I discussed the limitations, risks, security and privacy concerns of performing an evaluation and management service by telephone and the availability of in person appointments. The patient expressed understanding and agreed to proceed. ? ?Interactive audio and video telecommunications were attempted between this nurse and patient, however failed, due to patient having technical difficulties OR patient did not have access to video capability.  We continued and completed visit with audio only. ? ?Some vital signs may be absent or patient reported.  ? ?[name], LPN ? ?Review of Systems    ? ?Cardiac Risk Factors include: advanced age (>59mn, >>13women);male gender;sedentary lifestyle;Other (see comment), Risk factor comments: Cancer treatments for Multiple Myeloma, Neuropath, DDD. ? ?   ?Objective:  ?  ?Today's Vitals  ? 04/25/21 1403 04/25/21 1406  ?Weight: 173 lb (78.5 kg)   ?Height: _0  (1.727 m)   ?PainSc:  8   ? ?Body mass index is 26.3 kg/m?. ? ? ?  04/25/2021  ?  2:16 PM 04/24/2021  ? 10:04 AM 03/12/2021  ?  9:12 AM 02/20/2021  ?  9:05 AM 02/13/2021  ?  9:00 AM 02/13/2021  ?  8:56 AM 01/15/2021  ?  9:00 AM  ?Advanced Directives  ?Does Patient Have a Medical Advance Directive? _1  Yes   ?Type of [place] Living will;Healthcare Power of [place] will   [place]   ?Does patient want to make changes to medical advance directive?   No - Patient declined   No - Patient declined  No - Patient declined  ?Copy of HEricsonin Chart? No - copy requested  Yes - validated most recent copy scanned in chart (See row information)      ?Would patient like information on creating a medical advance directive?   No - Patient declined      ? ? ?Current Medications (verified) ?Outpatient Encounter Medications as of 04/25/2021  ?Medication Sig  ? acyclovir (ZOVIRAX) 400 MG tablet TAKE 1 TABLET BY MOUTH TWICE A DAY  ? ALPRAZolam (XANAX) 1 MG tablet Take 1 tablet (1 mg total) by mouth 3 (three) times daily as needed for anxiety.  ? CALCIUM-VITAMIN D PO Take 1 tablet by mouth 2 (two) times daily.   ? dexamethasone (DECADRON) 4 MG tablet   ? fluticasone (FLONASE) 50 MCG/ACT nasal spray Place 2 sprays into both nostrils daily.  ? furosemide (LASIX) 20 MG tablet TAKE 1 TABLET BY MOUTH ONCE A DAY AS NEEDED  ? gabapentin (NEURONTIN) 400 MG capsule TAKE 1 CAPSULE BY MOUTH 3 TIMES A DAY  ? Homeopathic Products (TFergus Apply topically as needed.  ? ibuprofen (ADVIL) 600 MG tablet Take 1 tablet (600 mg total) by mouth every 8 (eight) hours as needed.  ? lidocaine-prilocaine (EMLA) cream Apply small amount over port one (1) hour prior to appointment.  ? montelukast (SINGULAIR) 10 MG tablet TAKE 1 TABLET BY MOUTH EVERY NIGHT AT BEDTIME  ? omeprazole (PRILOSEC)  20 MG capsule TAKE 1 CAPSULE BY MOUTH ONCE DAILY  ? oxybutynin (DITROPAN-XL) 10 MG 24 hr tablet TAKE 1 TABLET BY MOUTH EVERY NIGHT AT BEDTIME  ? Oxycodone HCl 10 MG TABS TAKE 1 TO 2 TABLETS BY MOUTH 3 TIMES DAILY AS NEEDED  ? predniSONE (DELTASONE) 20 MG tablet Take 2 tablets (40 mg total) by mouth daily with breakfast.  ? senna-docusate (SENOKOT-S) 8.6-50 MG tablet Take 1 tablet by mouth 2 (two) times daily.  ? silver sulfADIAZINE (SILVADENE) 1 % cream Apply 1 application topically daily.  ? tamsulosin (FLOMAX) 0.4 MG CAPS capsule TAKE ONE CAPSULE BY MOUTH EVERY NIGHT ATBEDTIME  ? traZODone (DESYREL) 100 MG  tablet TAKE 3 TABLETS BY MOUTH AT BEDTIME  ? triamcinolone cream (KENALOG) 0.1 % APPLY 1 APPLICATION TOPICALLY TWICE A DAY  ? TURMERIC PO Take by mouth.  ? zolendronic acid (ZOMETA) 4 MG/5ML injection Inject 4 mg into the vein every 8 (eight) weeks.  ? cephALEXin (KEFLEX) 500 MG capsule Take 1 capsule (500 mg total) by mouth 3 (three) times daily. (Patient not taking: Reported on 04/25/2021)  ? ?Facility-Administered Encounter Medications as of 04/25/2021  ?Medication  ? 0.9 %  sodium chloride infusion  ? heparin lock flush 100 unit/mL  ? heparin lock flush 100 unit/mL  ? ? ?Allergies (verified) ?Codeine, Morphine and related, and Revlimid [lenalidomide]  ? ?History: ?Past Medical History:  ?Diagnosis Date  ? BPH (benign prostatic hyperplasia)   ? Colonic diverticular abscess 01/08/2015  ? Diverticulitis 12/22/14  ? complicated by abscess and required percutaneous drainage  ? GERD (gastroesophageal reflux disease)   ? H/O ETOH abuse   ? History of chemotherapy last done jan 2017  ? History of radiation therapy 01/05/13-02/10/13  ? 45 gray to left occipital condyle region  ? History of radiation therapy 12/01/16-12/10/16  ? Parasternal nodule, chest- 24 Gy total delivered in 8 fractions, Left sacro-iliac, pelvis- 24 Gy total delivered in 8 fractions   ? History of radiation therapy 02/19/17-02/22/17  ? right temporal scalp 30 Gy in 10 fractions  ? Intra-abdominal abscess ([place])   ? Multiple myeloma ([place]) 2015  ? Neuropathy   ? Radiation 02/21/14-03/08/14  ? right posterior chest wall area 30 gray  ? Radiation 04/19/14-05/02/14  ? lumbar spine 25 gray  ? Skull lesion   ? Left occipital condyle  ? Wrist fracture, left   ? x 2  ? ?Past Surgical History:  ?Procedure Laterality Date  ? COLONOSCOPY N/A 04/19/2015  ? Procedure: COLONOSCOPY;  Surgeon: Danie Binder, MD;  [facility];  Service: Endoscopy;  Laterality: N/A;  1:30 PM  ? CYST EXCISION  1959  ? tail bone  ? IR IMAGING GUIDED PORT INSERTION  09/25/2017  ? ?Family  History  ?Problem Relation Age of Onset  ? Diabetes Mother   ? Stroke Mother   ? Hypertension Father   ? ?Social History  ? ?Socioeconomic History  ? Marital status: Married  ?  Spouse name: Sefora Tietje  ? Number of children: 3  ? Years of education: Not on file  ? Highest education level: Not on file  ?Occupational History  ?  Employer: RETIRED  ?Tobacco Use  ? Smoking status: Never  ? Smokeless tobacco: Never  ?Vaping Use  ? Vaping Use: Never used  ?Substance and Sexual Activity  ? Alcohol use: No  ?  Comment: " Not much no more"  ? Drug use: No  ? Sexual activity: Not on file  ?Other Topics  Concern  ? Not on file  ?Social History Narrative  ? Not on file  ? ?Social Determinants of Health  ? ?Financial Resource Strain: Low Risk   ? Difficulty of Paying Living Expenses: Not hard at all  ?Food Insecurity: No Food Insecurity  ? Worried About Charity fundraiser in the Last Year: Never true  ? Ran Out of Food in the Last Year: Never true  ?Transportation Needs: No Transportation Needs  ? Lack of Transportation (Medical): No  ? Lack of Transportation (Non-Medical): No  ?Physical Activity: Insufficiently Active  ? Days of Exercise per Week: 5 days  ? Minutes of Exercise per Session: 20 min  ?Stress: No Stress Concern Present  ? Feeling of Stress : Not at all  ?Social Connections: Moderately Isolated  ? Frequency of Communication with Friends and Family: More than three times a week  ? Frequency of Social Gatherings with Friends and Family: More than three times a week  ? Attends Religious Services: Never  ? Active Member of Clubs or Organizations: No  ? Attends Archivist Meetings: Never  ? Marital Status: Married  ? ? ?Tobacco Counseling ?Counseling given: Not Answered ? ? ?Clinical Intake: ? ?Pre-visit preparation completed: Yes ? ?Pain : 0-10 ?Pain Score: 8  ?Pain Type: Chronic pain ?Pain Location: Back ?Pain Descriptors / Indicators: Aching, Dull ?Pain Onset: More than a month ago ?Pain Frequency:  Intermittent ? ?  ? ?BMI - recorded: 26.3 ?Nutritional Status: BMI 25 -29 Overweight ?Nutritional Risks: None ?Diabetes: No ? ?How often do you need to have someone help you when you read instructions, pamphlets, or

## 2021-04-25 NOTE — Patient Instructions (Signed)
[name] , ?Thank you for taking time to come for your Medicare Wellness Visit. I appreciate your ongoing commitment to your health goals. Please review the following plan we discussed and let me know if I can assist you in the future.  ? ?Screening recommendations/referrals: ?Colonoscopy: Done 04/19/2015 No repeat required.  ? ?Recommended yearly ophthalmology/optometry visit for glaucoma screening and checkup ?Recommended yearly dental visit for hygiene and checkup ? ?Vaccinations: ?Influenza vaccine: Due Repeat annually ? ?Pneumococcal vaccine: Discussed.  ?Tdap vaccine: Due Repeat in 10 years ? ?Shingles vaccine: Discussed.    ?Covid-19: Done 07/13/2019 ? ?Advanced directives: Copy in patient's chart.  ? ?Conditions/risks identified: Aim for 30 minutes of exercise or brisk walking, 6-8 glasses of water, and 5 servings of fruits and vegetables each day. ? ? ?Next appointment: Follow up in one year for your annual wellness visit. 2024 ? ?Preventive Care 84 Years and Older, Male ? ?Preventive care refers to lifestyle choices and visits with your health care provider that can promote health and wellness. ?What does preventive care include? ?A yearly physical exam. This is also called an annual well check. ?Dental exams once or twice a year. ?Routine eye exams. Ask your health care provider how often you should have your eyes checked. ?Personal lifestyle choices, including: ?Daily care of your teeth and gums. ?Regular physical activity. ?Eating a healthy diet. ?Avoiding tobacco and drug use. ?Limiting alcohol use. ?Practicing safe sex. ?Taking low doses of aspirin every day. ?Taking vitamin and mineral supplements as recommended by your health care provider. ?What happens during an annual well check? ?The services and screenings done by your health care provider during your annual well check will depend on your age, overall health, lifestyle risk factors, and family history of disease. ?Counseling  ?Your health care  provider may ask you questions about your: ?Alcohol use. ?Tobacco use. ?Drug use. ?Emotional well-being. ?Home and relationship well-being. ?Sexual activity. ?Eating habits. ?History of falls. ?Memory and ability to understand (cognition). ?Work and work Statistician. ?Screening  ?You may have the following tests or measurements: ?Height, weight, and BMI. ?Blood pressure. ?Lipid and cholesterol levels. These may be checked every 5 years, or more frequently if you are over 35 years old. ?Skin check. ?Lung cancer screening. You may have this screening every year starting at age 84 if you have a 30-pack-year history of smoking and currently smoke or have quit within the past 15 years. ?Fecal occult blood test (FOBT) of the stool. You may have this test every year starting at age 84. ?Flexible sigmoidoscopy or colonoscopy. You may have a sigmoidoscopy every 5 years or a colonoscopy every 10 years starting at age 84. ?Prostate cancer screening. Recommendations will vary depending on your family history and other risks. ?Hepatitis C blood test. ?Hepatitis B blood test. ?Sexually transmitted disease (STD) testing. ?Diabetes screening. This is done by checking your blood sugar (glucose) after you have not eaten for a while (fasting). You may have this done every 1-3 years. ?Abdominal aortic aneurysm (AAA) screening. You may need this if you are a current or former smoker. ?Osteoporosis. You may be screened starting at age 84 if you are at high risk. ?Talk with your health care provider about your test results, treatment options, and if necessary, the need for more tests. ?Vaccines  ?Your health care provider may recommend certain vaccines, such as: ?Influenza vaccine. This is recommended every year. ?Tetanus, diphtheria, and acellular pertussis (Tdap, Td) vaccine. You may need a Td booster every 10 years. ?  Zoster vaccine. You may need this after age 84 ?Pneumococcal 13-valent conjugate (PCV13) vaccine. One dose is  recommended after age 84 ?Pneumococcal polysaccharide (PPSV23) vaccine. One dose is recommended after age 84 ?Talk to your health care provider about which screenings and vaccines you need and how often you need them. ?This information is not intended to replace advice given to you by your health care provider. Make sure you discuss any questions you have with your health care provider. ?Document Released: 02/16/2015 Document Revised: 10/10/2015 Document Reviewed: 11/21/2014 ?Elsevier Interactive Patient Education ? 2017 [place]. ? ?Fall Prevention in the Home ?Falls can cause injuries. They can happen to people of all ages. There are many things you can do to make your home safe and to help prevent falls. ?What can I do on the outside of my home? ?Regularly fix the edges of walkways and driveways and fix any cracks. ?Remove anything that might make you trip as you walk through a door, such as a raised step or threshold. ?Trim any bushes or trees on the path to your home. ?Use bright outdoor lighting. ?Clear any walking paths of anything that might make someone trip, such as rocks or tools. ?Regularly check to see if handrails are loose or broken. Make sure that both sides of any steps have handrails. ?Any raised decks and porches should have guardrails on the edges. ?Have any leaves, snow, or ice cleared regularly. ?Use sand or salt on walking paths during winter. ?Clean up any spills in your garage right away. This includes oil or grease spills. ?What can I do in the bathroom? ?Use night lights. ?Install grab bars by the toilet and in the tub and shower. Do not use towel bars as grab bars. ?Use non-skid mats or decals in the tub or shower. ?If you need to sit down in the shower, use a plastic, non-slip stool. ?Keep the floor dry. Clean up any water that spills on the floor as soon as it happens. ?Remove soap buildup in the tub or shower regularly. ?Attach bath mats securely with double-sided non-slip rug  tape. ?Do not have throw rugs and other things on the floor that can make you trip. ?What can I do in the bedroom? ?Use night lights. ?Make sure that you have a light by your bed that is easy to reach. ?Do not use any sheets or blankets that are too big for your bed. They should not hang down onto the floor. ?Have a firm chair that has side arms. You can use this for support while you get dressed. ?Do not have throw rugs and other things on the floor that can make you trip. ?What can I do in the kitchen? ?Clean up any spills right away. ?Avoid walking on wet floors. ?Keep items that you use a lot in easy-to-reach places. ?If you need to reach something above you, use a strong step stool that has a grab bar. ?Keep electrical cords out of the way. ?Do not use floor polish or wax that makes floors slippery. If you must use wax, use non-skid floor wax. ?Do not have throw rugs and other things on the floor that can make you trip. ?What can I do with my stairs? ?Do not leave any items on the stairs. ?Make sure that there are handrails on both sides of the stairs and use them. Fix handrails that are broken or loose. Make sure that handrails are as long as the stairways. ?Check any carpeting to make sure that  it is firmly attached to the stairs. Fix any carpet that is loose or worn. ?Avoid having throw rugs at the top or bottom of the stairs. If you do have throw rugs, attach them to the floor with carpet tape. ?Make sure that you have a light switch at the top of the stairs and the bottom of the stairs. If you do not have them, ask someone to add them for you. ?What else can I do to help prevent falls? ?Wear shoes that: ?Do not have high heels. ?Have rubber bottoms. ?Are comfortable and fit you well. ?Are closed at the toe. Do not wear sandals. ?If you use a stepladder: ?Make sure that it is fully opened. Do not climb a closed stepladder. ?Make sure that both sides of the stepladder are locked into place. ?Ask someone to  hold it for you, if possible. ?Clearly mark and make sure that you can see: ?Any grab bars or handrails. ?First and last steps. ?Where the edge of each step is. ?Use tools that help you move around (mobility aids)

## 2021-04-26 LAB — PROTEIN ELECTROPHORESIS, SERUM
A/G Ratio: 1.1 (ref 0.7–1.7)
Albumin ELP: 3.2 g/dL (ref 2.9–4.4)
Alpha-1-Globulin: 0.3 g/dL (ref 0.0–0.4)
Alpha-2-Globulin: 0.6 g/dL (ref 0.4–1.0)
Beta Globulin: 0.7 g/dL (ref 0.7–1.3)
Gamma Globulin: 1.2 g/dL (ref 0.4–1.8)
Globulin, Total: 2.8 g/dL (ref 2.2–3.9)
M-Spike, %: 1.1 g/dL — ABNORMAL HIGH
Total Protein ELP: 6 g/dL (ref 6.0–8.5)

## 2021-04-29 ENCOUNTER — Other Ambulatory Visit: Payer: Self-pay | Admitting: Family Medicine

## 2021-05-01 DIAGNOSIS — M25551 Pain in right hip: Secondary | ICD-10-CM | POA: Diagnosis not present

## 2021-05-01 DIAGNOSIS — D696 Thrombocytopenia, unspecified: Secondary | ICD-10-CM | POA: Diagnosis not present

## 2021-05-01 DIAGNOSIS — C9000 Multiple myeloma not having achieved remission: Secondary | ICD-10-CM | POA: Diagnosis not present

## 2021-05-01 DIAGNOSIS — M25552 Pain in left hip: Secondary | ICD-10-CM | POA: Diagnosis not present

## 2021-05-01 DIAGNOSIS — M549 Dorsalgia, unspecified: Secondary | ICD-10-CM | POA: Diagnosis not present

## 2021-05-01 DIAGNOSIS — R296 Repeated falls: Secondary | ICD-10-CM | POA: Diagnosis not present

## 2021-05-06 DIAGNOSIS — R296 Repeated falls: Secondary | ICD-10-CM | POA: Diagnosis not present

## 2021-05-06 DIAGNOSIS — C9000 Multiple myeloma not having achieved remission: Secondary | ICD-10-CM | POA: Diagnosis not present

## 2021-05-06 DIAGNOSIS — M25552 Pain in left hip: Secondary | ICD-10-CM | POA: Diagnosis not present

## 2021-05-06 DIAGNOSIS — D696 Thrombocytopenia, unspecified: Secondary | ICD-10-CM | POA: Diagnosis not present

## 2021-05-06 DIAGNOSIS — M25551 Pain in right hip: Secondary | ICD-10-CM | POA: Diagnosis not present

## 2021-05-06 DIAGNOSIS — M549 Dorsalgia, unspecified: Secondary | ICD-10-CM | POA: Diagnosis not present

## 2021-05-07 DIAGNOSIS — D696 Thrombocytopenia, unspecified: Secondary | ICD-10-CM | POA: Diagnosis not present

## 2021-05-07 DIAGNOSIS — M25551 Pain in right hip: Secondary | ICD-10-CM | POA: Diagnosis not present

## 2021-05-07 DIAGNOSIS — M549 Dorsalgia, unspecified: Secondary | ICD-10-CM | POA: Diagnosis not present

## 2021-05-07 DIAGNOSIS — G629 Polyneuropathy, unspecified: Secondary | ICD-10-CM | POA: Diagnosis not present

## 2021-05-07 DIAGNOSIS — R296 Repeated falls: Secondary | ICD-10-CM | POA: Diagnosis not present

## 2021-05-07 DIAGNOSIS — M25552 Pain in left hip: Secondary | ICD-10-CM | POA: Diagnosis not present

## 2021-05-07 DIAGNOSIS — C9000 Multiple myeloma not having achieved remission: Secondary | ICD-10-CM | POA: Diagnosis not present

## 2021-05-13 ENCOUNTER — Other Ambulatory Visit: Payer: Self-pay | Admitting: Family Medicine

## 2021-05-13 ENCOUNTER — Other Ambulatory Visit: Payer: Self-pay | Admitting: Oncology

## 2021-05-13 DIAGNOSIS — D696 Thrombocytopenia, unspecified: Secondary | ICD-10-CM | POA: Diagnosis not present

## 2021-05-13 DIAGNOSIS — C9000 Multiple myeloma not having achieved remission: Secondary | ICD-10-CM | POA: Diagnosis not present

## 2021-05-13 DIAGNOSIS — M25551 Pain in right hip: Secondary | ICD-10-CM | POA: Diagnosis not present

## 2021-05-13 DIAGNOSIS — M25552 Pain in left hip: Secondary | ICD-10-CM | POA: Diagnosis not present

## 2021-05-13 DIAGNOSIS — M549 Dorsalgia, unspecified: Secondary | ICD-10-CM | POA: Diagnosis not present

## 2021-05-13 DIAGNOSIS — R296 Repeated falls: Secondary | ICD-10-CM | POA: Diagnosis not present

## 2021-05-22 ENCOUNTER — Other Ambulatory Visit: Payer: Self-pay | Admitting: Oncology

## 2021-05-22 DIAGNOSIS — C9000 Multiple myeloma not having achieved remission: Secondary | ICD-10-CM

## 2021-05-28 DIAGNOSIS — Z961 Presence of intraocular lens: Secondary | ICD-10-CM | POA: Diagnosis not present

## 2021-06-19 ENCOUNTER — Inpatient Hospital Stay: Payer: Medicare Other | Attending: Oncology

## 2021-06-19 DIAGNOSIS — G8929 Other chronic pain: Secondary | ICD-10-CM | POA: Insufficient documentation

## 2021-06-19 DIAGNOSIS — Z79899 Other long term (current) drug therapy: Secondary | ICD-10-CM | POA: Diagnosis not present

## 2021-06-19 DIAGNOSIS — M549 Dorsalgia, unspecified: Secondary | ICD-10-CM | POA: Insufficient documentation

## 2021-06-19 DIAGNOSIS — C9000 Multiple myeloma not having achieved remission: Secondary | ICD-10-CM | POA: Insufficient documentation

## 2021-06-19 DIAGNOSIS — R5383 Other fatigue: Secondary | ICD-10-CM | POA: Insufficient documentation

## 2021-06-19 DIAGNOSIS — D696 Thrombocytopenia, unspecified: Secondary | ICD-10-CM | POA: Insufficient documentation

## 2021-06-19 DIAGNOSIS — G629 Polyneuropathy, unspecified: Secondary | ICD-10-CM | POA: Insufficient documentation

## 2021-06-19 DIAGNOSIS — M2550 Pain in unspecified joint: Secondary | ICD-10-CM | POA: Insufficient documentation

## 2021-06-19 DIAGNOSIS — R531 Weakness: Secondary | ICD-10-CM | POA: Diagnosis not present

## 2021-06-19 LAB — CBC WITH DIFFERENTIAL/PLATELET
Abs Immature Granulocytes: 0.01 10*3/uL (ref 0.00–0.07)
Basophils Absolute: 0 10*3/uL (ref 0.0–0.1)
Basophils Relative: 0 %
Eosinophils Absolute: 0 10*3/uL (ref 0.0–0.5)
Eosinophils Relative: 0 %
HCT: 37.8 % — ABNORMAL LOW (ref 39.0–52.0)
Hemoglobin: 12.3 g/dL — ABNORMAL LOW (ref 13.0–17.0)
Immature Granulocytes: 0 %
Lymphocytes Relative: 37 %
Lymphs Abs: 1.9 10*3/uL (ref 0.7–4.0)
MCH: 30.5 pg (ref 26.0–34.0)
MCHC: 32.5 g/dL (ref 30.0–36.0)
MCV: 93.8 fL (ref 80.0–100.0)
Monocytes Absolute: 0.6 10*3/uL (ref 0.1–1.0)
Monocytes Relative: 11 %
Neutro Abs: 2.6 10*3/uL (ref 1.7–7.7)
Neutrophils Relative %: 52 %
Platelets: 118 10*3/uL — ABNORMAL LOW (ref 150–400)
RBC: 4.03 MIL/uL — ABNORMAL LOW (ref 4.22–5.81)
RDW: 13.5 % (ref 11.5–15.5)
WBC: 5 10*3/uL (ref 4.0–10.5)
nRBC: 0 % (ref 0.0–0.2)

## 2021-06-19 LAB — COMPREHENSIVE METABOLIC PANEL
ALT: 10 U/L (ref 0–44)
AST: 21 U/L (ref 15–41)
Albumin: 3.1 g/dL — ABNORMAL LOW (ref 3.5–5.0)
Alkaline Phosphatase: 20 U/L — ABNORMAL LOW (ref 38–126)
Anion gap: 5 (ref 5–15)
BUN: 21 mg/dL (ref 8–23)
CO2: 31 mmol/L (ref 22–32)
Calcium: 8.6 mg/dL — ABNORMAL LOW (ref 8.9–10.3)
Chloride: 100 mmol/L (ref 98–111)
Creatinine, Ser: 0.66 mg/dL (ref 0.61–1.24)
GFR, Estimated: >60 mL/min (ref >60)
Glucose, Bld: 97 mg/dL (ref 70–99)
Potassium: 4 mmol/L (ref 3.5–5.1)
Sodium: 136 mmol/L (ref 135–145)
Total Bilirubin: 1 mg/dL (ref 0.3–1.2)
Total Protein: 8.4 g/dL — ABNORMAL HIGH (ref 6.5–8.1)

## 2021-06-19 LAB — MAGNESIUM: Magnesium: 1.9 mg/dL (ref 1.7–2.4)

## 2021-06-20 ENCOUNTER — Other Ambulatory Visit: Payer: Self-pay | Admitting: Family Medicine

## 2021-06-20 LAB — IGG, IGA, IGM
IgA: 23 mg/dL — ABNORMAL LOW (ref 61–437)
IgG (Immunoglobin G), Serum: 3658 mg/dL — ABNORMAL HIGH (ref 603–1613)
IgM (Immunoglobulin M), Srm: 17 mg/dL (ref 15–143)

## 2021-06-20 LAB — KAPPA/LAMBDA LIGHT CHAINS
Kappa free light chain: 112.5 mg/L — ABNORMAL HIGH (ref 3.3–19.4)
Kappa, lambda light chain ratio: 32.14 — ABNORMAL HIGH (ref 0.26–1.65)
Lambda free light chains: 3.5 mg/L — ABNORMAL LOW (ref 5.7–26.3)

## 2021-06-20 NOTE — Telephone Encounter (Signed)
Requested Prescriptions  Pending Prescriptions Disp Refills  . oxybutynin (DITROPAN-XL) 10 MG 24 hr tablet [Pharmacy Med Name: OXYBUTYNIN CHLORIDE ER 10 MG TAB] 90 tablet 0    Sig: TAKE 1 TABLET BY MOUTH EVERY NIGHT AT BEDTIME     Urology:  Bladder Agents Passed - 06/20/2021  3:02 PM      Passed - Valid encounter within last 12 months    Recent Outpatient Visits          2 months ago [place] [name], MD   3 months ago Hypotension due to hypovolemia   [place], Warren T, MD   3 months ago Hypotension due to hypovolemia   [place] [name], MD   3 months ago Hypotension due to hypovolemia   [place], NP   7 months ago Seasonal allergic rhinitis due to pollen   [place] Eulogio Bear, NP

## 2021-06-21 LAB — PROTEIN ELECTROPHORESIS, SERUM
A/G Ratio: 0.8 (ref 0.7–1.7)
Albumin ELP: 3.6 g/dL (ref 2.9–4.4)
Alpha-1-Globulin: 0.3 g/dL (ref 0.0–0.4)
Alpha-2-Globulin: 0.7 g/dL (ref 0.4–1.0)
Beta Globulin: 0.7 g/dL (ref 0.7–1.3)
Gamma Globulin: 2.8 g/dL — ABNORMAL HIGH (ref 0.4–1.8)
Globulin, Total: 4.5 g/dL — ABNORMAL HIGH (ref 2.2–3.9)
M-Spike, %: 2.8 g/dL — ABNORMAL HIGH
Total Protein ELP: 8.1 g/dL (ref 6.0–8.5)

## 2021-06-23 NOTE — Progress Notes (Signed)
[name]  Telephone:([phone] [phone]  ID: Dean [name]. OB: September 25, 1935  MR#: [id number]  DXI#:[id number]  Patient Care Team: [name], MD as PCP - General (Family Medicine) Gery Pray, MD as Consulting Physician (Radiation Oncology) Danie Binder, MD (Inactive) as Consulting Physician (Gastroenterology) Michael Boston, MD as Consulting Physician (General Surgery)  CHIEF COMPLAINT: Multiple myeloma  INTERVAL HISTORY: Patient returns to clinic today for repeat laboratory work, further evaluation, and treatment planning.  He continues to have chronic weakness and fatigue, but otherwise feels well.  He has no neurologic complaints.  He denies any recent fevers or illnesses.  He has a good appetite and denies weight loss.  He has no chest pain, shortness of breath, cough, or hemoptysis.  He denies any nausea, vomiting, constipation, or diarrhea.  He has no urinary complaints.  Patient offers no further specific complaints today.  REVIEW OF SYSTEMS:   Review of Systems  Constitutional: Negative.  Negative for fever, malaise/fatigue and weight loss.  Respiratory: Negative.  Negative for cough, hemoptysis and shortness of breath.   Cardiovascular: Negative.  Negative for chest pain and leg swelling.  Gastrointestinal: Negative.  Negative for abdominal pain and nausea.  Genitourinary: Negative.  Negative for dysuria.  Musculoskeletal:  Positive for falls. Negative for back pain and joint pain.  Skin: Negative.  Negative for rash.  Neurological:  Positive for sensory change. Negative for dizziness, focal weakness, weakness and headaches.  Psychiatric/Behavioral: Negative.  The patient is not nervous/anxious and does not have insomnia.    As per HPI. Otherwise, a complete review of systems is negative.  PAST MEDICAL HISTORY: Past Medical History:  Diagnosis Date   BPH (benign prostatic hyperplasia)    Colonic diverticular abscess 01/08/2015    Diverticulitis 76/73/41   complicated by abscess and required percutaneous drainage   GERD (gastroesophageal reflux disease)    H/O ETOH abuse    History of chemotherapy last done jan 2017   History of radiation therapy 01/05/13-02/10/13   45 gray to left occipital condyle region   History of radiation therapy 12/01/16-12/10/16   Parasternal nodule, chest- 24 Gy total delivered in 8 fractions, Left sacro-iliac, pelvis- 24 Gy total delivered in 8 fractions    History of radiation therapy 02/19/17-02/22/17   right temporal scalp 30 Gy in 10 fractions   Intra-abdominal abscess (HCC)    Multiple myeloma ([place]) 2015   Neuropathy    Radiation 02/21/14-03/08/14   right posterior chest wall area 30 gray   Radiation 04/19/14-05/02/14   lumbar spine 25 gray   Skull lesion    Left occipital condyle   Wrist fracture, left    x 2    PAST SURGICAL HISTORY: Past Surgical History:  Procedure Laterality Date   COLONOSCOPY N/A 04/19/2015   Procedure: COLONOSCOPY;  Surgeon: Danie Binder, MD;  [facility];  Service: Endoscopy;  Laterality: N/A;  1:30 PM   CYST EXCISION  1959   tail bone   IR IMAGING GUIDED PORT INSERTION  09/25/2017    FAMILY HISTORY: Family History  Problem Relation Age of Onset   Diabetes Mother    Stroke Mother    Hypertension Father     ADVANCED DIRECTIVES (Y/N):  N  HEALTH MAINTENANCE: Social History   Tobacco Use   Smoking status: Never   Smokeless tobacco: Never  Vaping Use   Vaping Use: Never used  Substance Use Topics   Alcohol use: No    Comment: " Not  much no more"   Drug use: No     Colonoscopy:  PAP:  Bone density:  Lipid panel:  Allergies  Allergen Reactions   Codeine Other (See Comments)    Headache   Morphine And Related Other (See Comments)    Makes him feel weird   Revlimid [Lenalidomide] Other (See Comments)    "Causes me to become weak"    Current Outpatient Medications  Medication Sig Dispense Refill   acyclovir  (ZOVIRAX) 400 MG tablet TAKE 1 TABLET BY MOUTH TWICE A DAY 60 tablet 6   ALPRAZolam (XANAX) 1 MG tablet TAKE 1 TABLET BY MOUTH 3 TIMES DAILY AS NEEDED FOR ANXIETY 90 tablet 0   CALCIUM-VITAMIN D PO Take 1 tablet by mouth 2 (two) times daily.      fluticasone (FLONASE) 50 MCG/ACT nasal spray Place 2 sprays into both nostrils daily. 16 g 6   gabapentin (NEURONTIN) 400 MG capsule TAKE 1 CAPSULE BY MOUTH 3 TIMES A DAY 90 capsule 4   Homeopathic Products (THERAWORX RELIEF EX) Apply topically as needed.     ibuprofen (ADVIL) 600 MG tablet TAKE 1 TABLET BY MOUTH EVERY 8 HOURS AS NEEDED 30 tablet 0   montelukast (SINGULAIR) 10 MG tablet TAKE 1 TABLET BY MOUTH EVERY NIGHT AT BEDTIME 30 tablet 3   omeprazole (PRILOSEC) 20 MG capsule TAKE 1 CAPSULE BY MOUTH ONCE DAILY 30 capsule 5   Oxycodone HCl 10 MG TABS TAKE 1 TO 2 TABLETS BY MOUTH 3 TIMES DAILY AS NEEDED 90 tablet 0   senna-docusate (SENOKOT-S) 8.6-50 MG tablet Take 1 tablet by mouth 2 (two) times daily. 60 tablet 5   silver sulfADIAZINE (SILVADENE) 1 % cream Apply 1 application topically daily. 50 g 0   tamsulosin (FLOMAX) 0.4 MG CAPS capsule TAKE ONE CAPSULE BY MOUTH EVERY NIGHT ATBEDTIME 30 capsule 11   traZODone (DESYREL) 100 MG tablet TAKE 3 TABLETS BY MOUTH AT BEDTIME 90 tablet 1   triamcinolone cream (KENALOG) 0.1 % APPLY 1 APPLICATION TOPICALLY TWICE A DAY 30 g 0   TURMERIC PO Take by mouth.     zolendronic acid (ZOMETA) 4 MG/5ML injection Inject 4 mg into the vein every 8 (eight) weeks.     cephALEXin (KEFLEX) 500 MG capsule Take 1 capsule (500 mg total) by mouth 3 (three) times daily. (Patient not taking: Reported on 04/25/2021) 42 capsule 0   dexamethasone (DECADRON) 4 MG tablet  (Patient not taking: Reported on 06/27/2021)     furosemide (LASIX) 20 MG tablet TAKE 1 TABLET BY MOUTH ONCE A DAY AS NEEDED (Patient not taking: Reported on 06/27/2021) 30 tablet 3   lidocaine-prilocaine (EMLA) cream Apply small amount over port one (1) hour prior to  appointment. (Patient not taking: Reported on 06/27/2021) 30 g 0   oxybutynin (DITROPAN-XL) 10 MG 24 hr tablet TAKE 1 TABLET BY MOUTH EVERY NIGHT AT BEDTIME (Patient not taking: Reported on 06/27/2021) 90 tablet 0   predniSONE (DELTASONE) 20 MG tablet Take 2 tablets (40 mg total) by mouth daily with breakfast. (Patient not taking: Reported on 06/27/2021) 10 tablet 0   Current Facility-Administered Medications  Medication Dose Route Frequency Provider Last Rate Last Admin   0.9 %  sodium chloride infusion   Intravenous Once Eulogio Bear, NP       Facility-Administered Medications Ordered in Other Visits  Medication Dose Route Frequency Provider Last Rate Last Admin   heparin lock flush 100 unit/mL  500 Units Intravenous Once Lloyd Huger, MD  heparin lock flush 100 unit/mL  500 Units Intracatheter Once PRN Lloyd Huger, MD        OBJECTIVE: Vitals:   06/27/21 1052  BP: (!) 121/56  Pulse: 75  Temp: (!) 96.6 F (35.9 C)  SpO2: 97%      Body mass index is 27.13 kg/m.    ECOG FS:1 - Symptomatic but completely ambulatory  General: Well-developed, well-nourished, no acute distress.  Sitting in a wheelchair. Eyes: Pink conjunctiva, anicteric sclera. HEENT: Normocephalic, moist mucous membranes. Lungs: No audible wheezing or coughing. Heart: Regular rate and rhythm. Abdomen: Soft, nontender, no obvious distention. Musculoskeletal: No edema, cyanosis, or clubbing. Neuro: Alert, answering all questions appropriately. Cranial nerves grossly intact. Skin: No rashes or petechiae noted. Psych: Normal affect.  LAB RESULTS:  Lab Results  Component Value Date   NA 136 06/19/2021   K 4.0 06/19/2021   CL 100 06/19/2021   CO2 31 06/19/2021   GLUCOSE 97 06/19/2021   BUN 21 06/19/2021   CREATININE 0.66 06/19/2021   CALCIUM 8.6 (L) 06/19/2021   PROT 8.4 (H) 06/19/2021   ALBUMIN 3.1 (L) 06/19/2021   AST 21 06/19/2021   ALT 10 06/19/2021   ALKPHOS 20 (L) 06/19/2021    BILITOT 1.0 06/19/2021   GFRNONAA >60 06/19/2021   GFRAA >60 10/13/2019    Lab Results  Component Value Date   WBC 5.0 06/19/2021   NEUTROABS 2.6 06/19/2021   HGB 12.3 (L) 06/19/2021   HCT 37.8 (L) 06/19/2021   MCV 93.8 06/19/2021   PLT 118 (L) 06/19/2021     STUDIES: No results found.  ASSESSMENT: Multiple myeloma  PLAN:  Multiple myeloma: Please see note from September 17, 2020 for historic details of his myeloma and treatment.  Patient's most recent M spike has increased significantly to 2.8 and his IgG immunoglobulin component is now 3658.  Kappa chains have increased to 112.5.  Previously, patient was receiving daratumumab every 4 weeks and Zometa every 8 weeks, but patient requested a chemotherapy holiday and his last treatment occurred on January 15, 2021.  Given the rise of his M spike and immunoglobulins, will reinitiate treatment.  Return to clinic on July 10, 2021 for laboratory work, evaluation, and daratumumab plus Zometa.     Thrombocytopenia: Chronic and unchanged.  Patient platelet count is 118 today.   Back/joint pain: Chronic and unchanged.  Continue oxycodone as prescribed.  Recent x-ray did not show any fracture.  Consider repeat imaging and referral to orthopedics if needed. Peripheral neuropathy: Chronic and unchanged.  Continue gabapentin as prescribed. Insomnia/anxiety: Chronic and unchanged.  Patient is now having his medications adjusted by primary care.   Hypotension: Resolved. Hypokalemia: Resolved.  Patient expressed understanding and was in agreement with this plan. He also understands that He can call clinic at any time with any questions, concerns, or complaints.    Cancer Staging  No matching staging information was found for the patient.  Lloyd Huger, MD   06/28/2021 8:59 AM

## 2021-06-27 ENCOUNTER — Encounter: Payer: Self-pay | Admitting: Oncology

## 2021-06-27 ENCOUNTER — Inpatient Hospital Stay (HOSPITAL_BASED_OUTPATIENT_CLINIC_OR_DEPARTMENT_OTHER): Payer: Medicare Other | Admitting: Oncology

## 2021-06-27 VITALS — BP 121/56 | HR 75 | Temp 96.6°F | Ht 68.0 in | Wt 178.4 lb

## 2021-06-27 DIAGNOSIS — G8929 Other chronic pain: Secondary | ICD-10-CM | POA: Diagnosis not present

## 2021-06-27 DIAGNOSIS — M549 Dorsalgia, unspecified: Secondary | ICD-10-CM | POA: Diagnosis not present

## 2021-06-27 DIAGNOSIS — R5383 Other fatigue: Secondary | ICD-10-CM | POA: Diagnosis not present

## 2021-06-27 DIAGNOSIS — D696 Thrombocytopenia, unspecified: Secondary | ICD-10-CM | POA: Diagnosis not present

## 2021-06-27 DIAGNOSIS — R531 Weakness: Secondary | ICD-10-CM | POA: Diagnosis not present

## 2021-06-27 DIAGNOSIS — C9000 Multiple myeloma not having achieved remission: Secondary | ICD-10-CM

## 2021-06-28 ENCOUNTER — Encounter (HOSPITAL_COMMUNITY): Payer: Self-pay | Admitting: Hematology

## 2021-07-02 ENCOUNTER — Other Ambulatory Visit: Payer: Self-pay | Admitting: Family Medicine

## 2021-07-02 ENCOUNTER — Other Ambulatory Visit: Payer: Self-pay | Admitting: Oncology

## 2021-07-02 DIAGNOSIS — C9000 Multiple myeloma not having achieved remission: Secondary | ICD-10-CM

## 2021-07-02 DIAGNOSIS — G4700 Insomnia, unspecified: Secondary | ICD-10-CM

## 2021-07-02 NOTE — Telephone Encounter (Signed)
Last filled 05/13/21 Last ov 03/26/21 Next ov none

## 2021-07-03 DIAGNOSIS — H524 Presbyopia: Secondary | ICD-10-CM | POA: Diagnosis not present

## 2021-07-04 ENCOUNTER — Inpatient Hospital Stay: Payer: Medicare Other | Attending: Oncology

## 2021-07-04 DIAGNOSIS — C9000 Multiple myeloma not having achieved remission: Secondary | ICD-10-CM | POA: Insufficient documentation

## 2021-07-04 DIAGNOSIS — Z79899 Other long term (current) drug therapy: Secondary | ICD-10-CM | POA: Insufficient documentation

## 2021-07-04 DIAGNOSIS — Z5112 Encounter for antineoplastic immunotherapy: Secondary | ICD-10-CM | POA: Insufficient documentation

## 2021-07-04 NOTE — Progress Notes (Signed)
Nutrition Follow-up:  Patient with multiple myeloma.  Planning to resume treatment.  Spoke with patient via phone.  Patient reports that his appetite is maybe a little bit better.  Has been drinking carnation breakfast essentials shake (powder and premade) 2-3 times a day.  Daughter brought him lunch of butter beans and corn and cornbread.  Usually eats eggs, ham, grits for breakfast.  Evening meal is leftovers from lunch.  Denies nausea.     Medications: reviewed  Labs: reviewed  Anthropometrics:   Weight increased to 178 lb 6.4 oz on 5/25  173 lb 3/22 183 lb 2/7 176 lb on 1/11   NUTRITION DIAGNOSIS: Inadequate oral intake stable   INTERVENTION:  Encouraged patient to continue drinking oral nutrition supplements Continue eating high calorie high protein foods    MONITORING, EVALUATION, GOAL: weight trends, intake   NEXT VISIT: as needed  Kalel Harty B. Zenia Resides, [place], [place] Registered Dietitian [phone]

## 2021-07-08 NOTE — Progress Notes (Unsigned)
[name]  [phone] [phone]  ID: [name]. OB: [birth year]  MR#: [id number]  AOZ#:[id number]  Patient Care Team: [name], MD as PCP - General (Family Medicine) Gery Pray, MD as Consulting Physician (Radiation Oncology) Danie Binder, MD (Inactive) as Consulting Physician (Gastroenterology) Michael Boston, MD as Consulting Physician (General Surgery)  CHIEF COMPLAINT: Multiple myeloma  INTERVAL HISTORY: Patient returns to clinic today for repeat laboratory, further evaluation, and reinitiation of daratumumab.  He continues to have chronic weakness and fatigue.  Patient complains of significant right hip and lumbar back pain today.  He has had several falls in the interim.  He has no neurologic complaints.  He denies any recent fevers or illnesses.  He has a good appetite and denies weight loss.  He has no chest pain, shortness of breath, cough, or hemoptysis.  He denies any nausea, vomiting, constipation, or diarrhea.  He has no urinary complaints.  Patient offers no further specific complaints today.  REVIEW OF SYSTEMS:   Review of Systems  Constitutional: Negative.  Negative for fever, malaise/fatigue and weight loss.  Respiratory: Negative.  Negative for cough, hemoptysis and shortness of breath.   Cardiovascular: Negative.  Negative for chest pain and leg swelling.  Gastrointestinal: Negative.  Negative for abdominal pain and nausea.  Genitourinary: Negative.  Negative for dysuria.  Musculoskeletal:  Positive for back pain, falls and joint pain.  Skin: Negative.  Negative for rash.  Neurological:  Positive for sensory change. Negative for dizziness, focal weakness, weakness and headaches.  Psychiatric/Behavioral: Negative.  The patient is not nervous/anxious and does not have insomnia.    As per HPI. Otherwise, a complete review of systems is negative.  PAST MEDICAL HISTORY: Past Medical History:  Diagnosis Date    BPH (benign prostatic hyperplasia)    Colonic diverticular abscess 01/08/2015   Diverticulitis 96/29/52   complicated by abscess and required percutaneous drainage   GERD (gastroesophageal reflux disease)    H/O ETOH abuse    History of chemotherapy last done jan 2017   History of radiation therapy 01/05/13-02/10/13   45 gray to left occipital condyle region   History of radiation therapy 12/01/16-12/10/16   Parasternal nodule, chest- 24 Gy total delivered in 8 fractions, Left sacro-iliac, pelvis- 24 Gy total delivered in 8 fractions    History of radiation therapy 02/19/17-02/22/17   right temporal scalp 30 Gy in 10 fractions   Intra-abdominal abscess (HCC)    Multiple myeloma ([place]) 2015   Neuropathy    Radiation 02/21/14-03/08/14   right posterior chest wall area 30 gray   Radiation 04/19/14-05/02/14   lumbar spine 25 gray   Skull lesion    Left occipital condyle   Wrist fracture, left    x 2    PAST SURGICAL HISTORY: Past Surgical History:  Procedure Laterality Date   COLONOSCOPY N/A 04/19/2015   Procedure: COLONOSCOPY;  Surgeon: Danie Binder, MD;  [facility];  Service: Endoscopy;  Laterality: N/A;  1:30 PM   CYST EXCISION  1959   tail bone   IR IMAGING GUIDED PORT INSERTION  09/25/2017    FAMILY HISTORY: Family History  Problem Relation Age of Onset   Diabetes Mother    Stroke Mother    Hypertension Father     ADVANCED DIRECTIVES (Y/N):  N  HEALTH MAINTENANCE: Social History   Tobacco Use   Smoking status: Never   Smokeless tobacco: Never  Vaping Use   Vaping Use: Never used  Substance Use Topics   Alcohol use: No    Comment: " Not much no more"   Drug use: No     Colonoscopy:  PAP:  Bone density:  Lipid panel:  Allergies  Allergen Reactions   Codeine Other (See Comments)    Headache   Morphine And Related Other (See Comments)    Makes him feel weird   Revlimid [Lenalidomide] Other (See Comments)    "Causes me to become weak"     Current Outpatient Medications  Medication Sig Dispense Refill   acyclovir (ZOVIRAX) 400 MG tablet TAKE 1 TABLET BY MOUTH TWICE A DAY 60 tablet 6   ALPRAZolam (XANAX) 1 MG tablet TAKE 1 TABLET BY MOUTH 3 TIMES DAILY AS NEEDED FOR ANXIETY 90 tablet 0   CALCIUM-VITAMIN D PO Take 1 tablet by mouth 2 (two) times daily.      fluticasone (FLONASE) 50 MCG/ACT nasal spray Place 2 sprays into both nostrils daily. 16 g 6   furosemide (LASIX) 20 MG tablet TAKE 1 TABLET BY MOUTH ONCE A DAY AS NEEDED 30 tablet 3   gabapentin (NEURONTIN) 400 MG capsule TAKE 1 CAPSULE BY MOUTH 3 TIMES A DAY 90 capsule 4   Homeopathic Products (THERAWORX RELIEF EX) Apply topically as needed.     ibuprofen (ADVIL) 600 MG tablet TAKE 1 TABLET BY MOUTH EVERY 8 HOURS AS NEEDED 30 tablet 0   montelukast (SINGULAIR) 10 MG tablet TAKE 1 TABLET BY MOUTH EVERY NIGHT AT BEDTIME 30 tablet 3   omeprazole (PRILOSEC) 20 MG capsule TAKE 1 CAPSULE BY MOUTH ONCE DAILY 30 capsule 5   oxybutynin (DITROPAN-XL) 10 MG 24 hr tablet TAKE 1 TABLET BY MOUTH EVERY NIGHT AT BEDTIME 90 tablet 0   Oxycodone HCl 10 MG TABS TAKE 1 TO 2 TABLETS BY MOUTH 3 TIMES DAILY AS NEEDED 90 tablet 0   senna-docusate (SENOKOT-S) 8.6-50 MG tablet Take 1 tablet by mouth 2 (two) times daily. 60 tablet 5   silver sulfADIAZINE (SILVADENE) 1 % cream Apply 1 application topically daily. 50 g 0   tamsulosin (FLOMAX) 0.4 MG CAPS capsule TAKE ONE CAPSULE BY MOUTH EVERY NIGHT ATBEDTIME 30 capsule 11   traZODone (DESYREL) 100 MG tablet TAKE 3 TABLETS BY MOUTH AT BEDTIME 90 tablet 1   triamcinolone cream (KENALOG) 0.1 % APPLY 1 APPLICATION TOPICALLY TWICE A DAY 30 g 0   TURMERIC PO Take by mouth.     zolendronic acid (ZOMETA) 4 MG/5ML injection Inject 4 mg into the vein every 8 (eight) weeks.     cephALEXin (KEFLEX) 500 MG capsule Take 1 capsule (500 mg total) by mouth 3 (three) times daily. (Patient not taking: Reported on 04/25/2021) 42 capsule 0   dexamethasone (DECADRON) 4  MG tablet  (Patient not taking: Reported on 06/27/2021)     lidocaine-prilocaine (EMLA) cream Apply small amount over port one (1) hour prior to appointment. (Patient not taking: Reported on 06/27/2021) 30 g 0   predniSONE (DELTASONE) 20 MG tablet Take 2 tablets (40 mg total) by mouth daily with breakfast. (Patient not taking: Reported on 06/27/2021) 10 tablet 0   Current Facility-Administered Medications  Medication Dose Route Frequency Provider Last Rate Last Admin   0.9 %  sodium chloride infusion   Intravenous Once Eulogio Bear, NP       Facility-Administered Medications Ordered in Other Visits  Medication Dose Route Frequency Provider Last Rate Last Admin   heparin lock flush 100 unit/mL  500 Units Intravenous Once Lloyd Huger,  MD       heparin lock flush 100 unit/mL  500 Units Intracatheter Once PRN Lloyd Huger, MD        OBJECTIVE: Vitals:   07/10/21 0946  BP: (!) 111/56  Pulse: 71  Resp: 18  Temp: (!) 96.7 F (35.9 C)  SpO2: 98%      Body mass index is 26.61 kg/m.    ECOG FS:1 - Symptomatic but completely ambulatory  General: Well-developed, well-nourished, no acute distress.  Sitting in a wheelchair. Eyes: Pink conjunctiva, anicteric sclera. HEENT: Normocephalic, moist mucous membranes. Lungs: No audible wheezing or coughing. Heart: Regular rate and rhythm. Abdomen: Soft, nontender, no obvious distention. Musculoskeletal: No edema, cyanosis, or clubbing. Neuro: Alert, answering all questions appropriately. Cranial nerves grossly intact. Skin: No rashes or petechiae noted. Psych: Normal affect.   LAB RESULTS:  Lab Results  Component Value Date   NA 138 07/10/2021   K 3.5 07/10/2021   CL 102 07/10/2021   CO2 32 07/10/2021   GLUCOSE 83 07/10/2021   BUN 21 07/10/2021   CREATININE 0.79 07/10/2021   CALCIUM 8.2 (L) 07/10/2021   PROT 7.8 07/10/2021   ALBUMIN 2.6 (L) 07/10/2021   AST 20 07/10/2021   ALT 10 07/10/2021   ALKPHOS 20 (L)  07/10/2021   BILITOT 0.5 07/10/2021   GFRNONAA >60 07/10/2021   GFRAA >60 10/13/2019    Lab Results  Component Value Date   WBC 3.4 (L) 07/10/2021   NEUTROABS 1.9 07/10/2021   HGB 10.7 (L) 07/10/2021   HCT 33.0 (L) 07/10/2021   MCV 94.6 07/10/2021   PLT 112 (L) 07/10/2021     STUDIES: No results found.  ASSESSMENT: Multiple myeloma  PLAN:  Multiple myeloma: Please see note from September 17, 2020 for historic details of his myeloma and treatment.  Patient's most recent M spike has increased significantly to 2.8 and his IgG immunoglobulin component is now 3658.  Kappa chains have increased to 112.5.  Previously, patient was receiving daratumumab every 4 weeks and Zometa every 8 weeks, but patient requested a chemotherapy holiday and his last treatment occurred on January 15, 2021.  Given the rise of his M spike and immunoglobulins, will reinitiate treatment, patient will reinitiate treatment today.  Return to clinic in 4 weeks for further evaluation and continuation of daratumumab plus Zometa.     Thrombocytopenia: Chronic and unchanged.  Patient's platelet count is 112 today.   Back/joint pain: Significantly worse.  Continue oxycodone as prescribed.  Recent x-ray did not show any fracture.  Will get right hip and lumbar spine MRI in the next several weeks.   Peripheral neuropathy: Chronic and unchanged.  Continue gabapentin as prescribed. Insomnia/anxiety: Chronic and unchanged.  Patient is now having his medications adjusted by primary care.     Patient expressed understanding and was in agreement with this plan. He also understands that He can call clinic at any time with any questions, concerns, or complaints.    Cancer Staging  No matching staging information was found for the patient.  Lloyd Huger, MD   07/10/2021 3:23 PM

## 2021-07-10 ENCOUNTER — Inpatient Hospital Stay: Payer: Medicare Other

## 2021-07-10 ENCOUNTER — Encounter: Payer: Self-pay | Admitting: Oncology

## 2021-07-10 ENCOUNTER — Inpatient Hospital Stay (HOSPITAL_BASED_OUTPATIENT_CLINIC_OR_DEPARTMENT_OTHER): Payer: Medicare Other | Admitting: Oncology

## 2021-07-10 VITALS — BP 111/56 | HR 71 | Temp 96.7°F | Resp 18 | Wt 175.0 lb

## 2021-07-10 DIAGNOSIS — C9000 Multiple myeloma not having achieved remission: Secondary | ICD-10-CM

## 2021-07-10 DIAGNOSIS — M25551 Pain in right hip: Secondary | ICD-10-CM | POA: Diagnosis not present

## 2021-07-10 DIAGNOSIS — Z79899 Other long term (current) drug therapy: Secondary | ICD-10-CM | POA: Diagnosis not present

## 2021-07-10 DIAGNOSIS — M545 Low back pain, unspecified: Secondary | ICD-10-CM

## 2021-07-10 DIAGNOSIS — Z5112 Encounter for antineoplastic immunotherapy: Secondary | ICD-10-CM | POA: Diagnosis not present

## 2021-07-10 LAB — CBC WITH DIFFERENTIAL/PLATELET
Abs Immature Granulocytes: 0.01 10*3/uL (ref 0.00–0.07)
Basophils Absolute: 0 10*3/uL (ref 0.0–0.1)
Basophils Relative: 1 %
Eosinophils Absolute: 0 10*3/uL (ref 0.0–0.5)
Eosinophils Relative: 1 %
HCT: 33 % — ABNORMAL LOW (ref 39.0–52.0)
Hemoglobin: 10.7 g/dL — ABNORMAL LOW (ref 13.0–17.0)
Immature Granulocytes: 0 %
Lymphocytes Relative: 33 %
Lymphs Abs: 1.1 10*3/uL (ref 0.7–4.0)
MCH: 30.7 pg (ref 26.0–34.0)
MCHC: 32.4 g/dL (ref 30.0–36.0)
MCV: 94.6 fL (ref 80.0–100.0)
Monocytes Absolute: 0.3 10*3/uL (ref 0.1–1.0)
Monocytes Relative: 9 %
Neutro Abs: 1.9 10*3/uL (ref 1.7–7.7)
Neutrophils Relative %: 56 %
Platelets: 112 10*3/uL — ABNORMAL LOW (ref 150–400)
RBC: 3.49 MIL/uL — ABNORMAL LOW (ref 4.22–5.81)
RDW: 13.4 % (ref 11.5–15.5)
WBC: 3.4 10*3/uL — ABNORMAL LOW (ref 4.0–10.5)
nRBC: 0 % (ref 0.0–0.2)

## 2021-07-10 LAB — COMPREHENSIVE METABOLIC PANEL
ALT: 10 U/L (ref 0–44)
AST: 20 U/L (ref 15–41)
Albumin: 2.6 g/dL — ABNORMAL LOW (ref 3.5–5.0)
Alkaline Phosphatase: 20 U/L — ABNORMAL LOW (ref 38–126)
Anion gap: 4 — ABNORMAL LOW (ref 5–15)
BUN: 21 mg/dL (ref 8–23)
CO2: 32 mmol/L (ref 22–32)
Calcium: 8.2 mg/dL — ABNORMAL LOW (ref 8.9–10.3)
Chloride: 102 mmol/L (ref 98–111)
Creatinine, Ser: 0.79 mg/dL (ref 0.61–1.24)
GFR, Estimated: >60 mL/min (ref >60)
Glucose, Bld: 83 mg/dL (ref 70–99)
Potassium: 3.5 mmol/L (ref 3.5–5.1)
Sodium: 138 mmol/L (ref 135–145)
Total Bilirubin: 0.5 mg/dL (ref 0.3–1.2)
Total Protein: 7.8 g/dL (ref 6.5–8.1)

## 2021-07-10 LAB — MAGNESIUM: Magnesium: 1.9 mg/dL (ref 1.7–2.4)

## 2021-07-10 MED ORDER — ZOLEDRONIC ACID 4 MG/100ML IV SOLN
4.0000 mg | Freq: Once | INTRAVENOUS | Status: AC
  Administered 2021-07-10: 4 mg via INTRAVENOUS
  Filled 2021-07-10: qty 100

## 2021-07-10 MED ORDER — DARATUMUMAB-HYALURONIDASE-FIHJ 1800-30000 MG-UT/15ML ~~LOC~~ SOLN
1800.0000 mg | Freq: Once | SUBCUTANEOUS | Status: AC
  Administered 2021-07-10: 1800 mg via SUBCUTANEOUS
  Filled 2021-07-10: qty 15

## 2021-07-10 MED ORDER — PROCHLORPERAZINE MALEATE 10 MG PO TABS
10.0000 mg | ORAL_TABLET | Freq: Once | ORAL | Status: AC
  Administered 2021-07-10: 10 mg via ORAL
  Filled 2021-07-10: qty 1

## 2021-07-10 MED ORDER — SODIUM CHLORIDE 0.9 % IV SOLN
Freq: Once | INTRAVENOUS | Status: AC
  Filled 2021-07-10: qty 250

## 2021-07-10 MED ORDER — DEXAMETHASONE 4 MG PO TABS
20.0000 mg | ORAL_TABLET | Freq: Once | ORAL | Status: AC
  Administered 2021-07-10: 20 mg via ORAL
  Filled 2021-07-10: qty 5

## 2021-07-10 MED ORDER — DIPHENHYDRAMINE HCL 25 MG PO CAPS
25.0000 mg | ORAL_CAPSULE | Freq: Once | ORAL | Status: AC
  Administered 2021-07-10: 25 mg via ORAL
  Filled 2021-07-10: qty 1

## 2021-07-10 MED ORDER — HEPARIN SOD (PORK) LOCK FLUSH 100 UNIT/ML IV SOLN
500.0000 [IU] | Freq: Once | INTRAVENOUS | Status: AC | PRN
  Administered 2021-07-10: 500 [IU]
  Filled 2021-07-10: qty 5

## 2021-07-10 MED ORDER — ACETAMINOPHEN 325 MG PO TABS
650.0000 mg | ORAL_TABLET | Freq: Once | ORAL | Status: AC
  Administered 2021-07-10: 650 mg via ORAL
  Filled 2021-07-10: qty 2

## 2021-07-10 NOTE — Progress Notes (Signed)
Calcium today is 8.2. Per [name], ok to proceed with zometa.

## 2021-07-10 NOTE — Progress Notes (Signed)
Patient here for oncology follow-up appointment, concerns of headaches and urinary urgency

## 2021-07-10 NOTE — Patient Instructions (Signed)
[facility]  Discharge Instructions: ?Thank you for choosing [facility] to provide your oncology and hematology care.  ?If you have a lab appointment with the Cancer Center, please go directly to the Cancer Center and check in at the registration area. ? ?Wear comfortable clothing and clothing appropriate for easy access to any Portacath or PICC line.  ? ?We strive to give you quality time with your provider. You may need to reschedule your appointment if you arrive late (15 or more minutes).  Arriving late affects you and other patients whose appointments are after yours.  Also, if you miss three or more appointments without notifying the office, you may be dismissed from the clinic at the provider?s discretion.    ?  ?For prescription refill requests, have your pharmacy contact our office and allow 72 hours for refills to be completed.   ? ?  ?To help prevent nausea and vomiting after your treatment, we encourage you to take your nausea medication as directed. ? ?BELOW ARE SYMPTOMS THAT SHOULD BE REPORTED IMMEDIATELY: ?*FEVER GREATER THAN 100.4 F (38 ?C) OR HIGHER ?*CHILLS OR SWEATING ?*NAUSEA AND VOMITING THAT IS NOT CONTROLLED WITH YOUR NAUSEA MEDICATION ?*UNUSUAL SHORTNESS OF BREATH ?*UNUSUAL BRUISING OR BLEEDING ?*URINARY PROBLEMS (pain or burning when urinating, or frequent urination) ?*BOWEL PROBLEMS (unusual diarrhea, constipation, pain near the anus) ?TENDERNESS IN MOUTH AND THROAT WITH OR WITHOUT PRESENCE OF ULCERS (sore throat, sores in mouth, or a toothache) ?UNUSUAL RASH, SWELLING OR PAIN  ?UNUSUAL VAGINAL DISCHARGE OR ITCHING  ? ?Items with * indicate a potential emergency and should be followed up as soon as possible or go to the Emergency Department if any problems should occur. ? ?Please show the CHEMOTHERAPY ALERT CARD or IMMUNOTHERAPY ALERT CARD at check-in to the Emergency Department and triage nurse. ? ?Should you have questions after your visit  or need to cancel or reschedule your appointment, please contact [facility]  [phone] and follow the prompts.  Office hours are 8:00 a.m. to 4:30 p.m. Monday - Friday. Please note that voicemails left after 4:00 p.m. may not be returned until the following business day.  We are closed weekends and major holidays. You have access to a nurse at all times for urgent questions. Please call the main number to the clinic [phone] and follow the prompts. ? ?For any non-urgent questions, you may also contact your provider using MyChart. We now offer e-Visits for anyone 18 and older to request care online for non-urgent symptoms. For details visit [email or web address]. ?  ?Also download the MyChart app! Go to the app store, search "MyChart", open the app, select [facility], and log in with your MyChart username and password. ? ?Due to Covid, a mask is required upon entering the hospital/clinic. If you do not have a mask, one will be given to you upon arrival. For doctor visits, patients may have 1 support person aged 18 or older with them. For treatment visits, patients cannot have anyone with them due to current Covid guidelines and our immunocompromised population.  ?

## 2021-07-11 LAB — IGG, IGA, IGM
IgA: 18 mg/dL — ABNORMAL LOW (ref 61–437)
IgG (Immunoglobin G), Serum: 4007 mg/dL — ABNORMAL HIGH (ref 603–1613)
IgM (Immunoglobulin M), Srm: 14 mg/dL — ABNORMAL LOW (ref 15–143)

## 2021-07-11 LAB — KAPPA/LAMBDA LIGHT CHAINS
Kappa free light chain: 139.3 mg/L — ABNORMAL HIGH (ref 3.3–19.4)
Kappa, lambda light chain ratio: 44.94 — ABNORMAL HIGH (ref 0.26–1.65)
Lambda free light chains: 3.1 mg/L — ABNORMAL LOW (ref 5.7–26.3)

## 2021-07-12 LAB — PROTEIN ELECTROPHORESIS, SERUM
A/G Ratio: 0.7 (ref 0.7–1.7)
Albumin ELP: 3.1 g/dL (ref 2.9–4.4)
Alpha-1-Globulin: 0.2 g/dL (ref 0.0–0.4)
Alpha-2-Globulin: 0.5 g/dL (ref 0.4–1.0)
Beta Globulin: 0.7 g/dL (ref 0.7–1.3)
Gamma Globulin: 2.9 g/dL — ABNORMAL HIGH (ref 0.4–1.8)
Globulin, Total: 4.4 g/dL — ABNORMAL HIGH (ref 2.2–3.9)
M-Spike, %: 2.9 g/dL — ABNORMAL HIGH
Total Protein ELP: 7.5 g/dL (ref 6.0–8.5)

## 2021-07-12 NOTE — Addendum Note (Signed)
Addended by: Delice Bison E on: 07/12/2021 09:34 AM   Modules accepted: Orders

## 2021-07-16 ENCOUNTER — Ambulatory Visit: Admission: RE | Admit: 2021-07-16 | Payer: Medicare Other | Source: Ambulatory Visit

## 2021-07-17 ENCOUNTER — Other Ambulatory Visit: Payer: Self-pay | Admitting: Oncology

## 2021-07-18 ENCOUNTER — Ambulatory Visit
Admission: EM | Admit: 2021-07-18 | Discharge: 2021-07-18 | Disposition: A | Discharge status: Home / Self Care | Payer: Medicare Other | Attending: Nurse Practitioner | Admitting: Nurse Practitioner

## 2021-07-18 ENCOUNTER — Encounter: Payer: Self-pay | Admitting: Emergency Medicine

## 2021-07-18 DIAGNOSIS — J309 Allergic rhinitis, unspecified: Secondary | ICD-10-CM | POA: Diagnosis not present

## 2021-07-18 DIAGNOSIS — R399 Unspecified symptoms and signs involving the genitourinary system: Secondary | ICD-10-CM | POA: Insufficient documentation

## 2021-07-18 LAB — POCT URINALYSIS DIP (MANUAL ENTRY)
Bilirubin, UA: NEGATIVE
Glucose, UA: NEGATIVE mg/dL
Leukocytes, UA: NEGATIVE
Nitrite, UA: NEGATIVE
Protein Ur, POC: NEGATIVE mg/dL
Spec Grav, UA: 1.02 (ref 1.010–1.025)
Urobilinogen, UA: 0.2 E.U./dL
pH, UA: 7.5 (ref 5.0–8.0)

## 2021-07-18 MED ORDER — CETIRIZINE HCL 10 MG PO TABS: 10.0000 mg | ORAL_TABLET | Freq: Every day | ORAL | 0 refills | Status: DC

## 2021-07-18 MED ORDER — SULFAMETHOXAZOLE-TRIMETHOPRIM 800-160 MG PO TABS: 1.0000 | ORAL_TABLET | Freq: Two times a day (BID) | ORAL | 0 refills | Status: AC

## 2021-07-18 NOTE — Discharge Instructions (Addendum)
-  Your urine does show blood, which could indicate a urinary tract infection.  As discussed, urine culture is pending, if the results are negative, you will be contacted and asked to stop the medication prescribed. -Take medications as prescribed. -Increase fluids. -Tylenol for pain, fever, or general discomfort. -Develop a toileting schedule that will allow you to toilet at least every 2 hours. -Avoid caffeine to include tea, soda, and coffee. -Follow-up in the emergency department if you develop fever, chills, worsening abdominal pain, or other concerns.  -Please follow-up with your doctor within the next 5 to 7 days for reevaluation.

## 2021-07-18 NOTE — ED Triage Notes (Signed)
Burning on urination x 3 days c/o lower back and lower ABD pain.

## 2021-07-18 NOTE — ED Provider Notes (Signed)
[facility]    CSN: [id number] Arrival date & time: 07/18/21  0818      History   Chief Complaint No chief complaint on file.   HPI Dean [name]. is a 86 y.o. male.   The history is provided by the patient and the spouse.   Patient presents for complaints of burning with urination that has been present for the past month.  Patient also complains of urinary frequency and urgency.  He denies fever, chills, hematuria, abdominal pain, or low back pain that has changed.  Patient states over the last 2 to 3 days, his urinary symptoms have worsened and have caused him to be unable to sleep.  Patient does have a history of BPH and multiple myeloma.  He also has a history of chronic back pain.  He also complains of nasal congestion has been present for the past several weeks.    Past Medical History:  Diagnosis Date   BPH (benign prostatic hyperplasia)    Colonic diverticular abscess 01/08/2015   Diverticulitis 46/65/99   complicated by abscess and required percutaneous drainage   GERD (gastroesophageal reflux disease)    H/O ETOH abuse    History of chemotherapy last done jan 2017   History of radiation therapy 01/05/13-02/10/13   45 gray to left occipital condyle region   History of radiation therapy 12/01/16-12/10/16   Parasternal nodule, chest- 24 Gy total delivered in 8 fractions, Left sacro-iliac, pelvis- 24 Gy total delivered in 8 fractions    History of radiation therapy 02/19/17-02/22/17   right temporal scalp 30 Gy in 10 fractions   Intra-abdominal abscess (HCC)    Multiple myeloma ([place]) 2015   Neuropathy    Radiation 02/21/14-03/08/14   right posterior chest wall area 30 gray   Radiation 04/19/14-05/02/14   lumbar spine 25 gray   Skull lesion    Left occipital condyle   Wrist fracture, left    x 2    Patient Active Problem List   Diagnosis Date Noted   Spinal stenosis, lumbar 10/13/2016   Chronic back pain 10/01/2016   DDD (degenerative disc disease),  lumbar 10/01/2016   OA (osteoarthritis) of knee 10/01/2016   Diverticulitis s/p robotic sigmoid colectomy 07/11/2015 07/11/2015   Goals of care, counseling/discussion    Abnormal CT scan, sigmoid colon 04/02/2015   Abnormal PET scan of colon 04/02/2015   Thrombocytopenia ([place]) 01/09/2015   Multiple myeloma without remission ([place]) 02/15/2014   Allergic rhinitis 01/16/2014   History of vertebral stress fracture 01/16/2014   Dermatitis 01/16/2014   Loss of weight 09/05/2013   Insomnia 09/05/2013   Fatigue 05/16/2013   Neck pain 12/07/2012   Skull lesion 11/29/2012   Neuropathy (HCC)    BPH (benign prostatic hyperplasia)    GERD (gastroesophageal reflux disease)    H/O ETOH abuse     Past Surgical History:  Procedure Laterality Date   COLONOSCOPY N/A 04/19/2015   Procedure: COLONOSCOPY;  Surgeon: Danie Binder, MD;  Location: [facility];  Service: Endoscopy;  Laterality: N/A;  1:30 PM   CYST EXCISION  1959   tail bone   IR IMAGING GUIDED PORT INSERTION  09/25/2017       Home Medications    Prior to Admission medications   Medication Sig Start Date End Date Taking? Authorizing Provider  sulfamethoxazole-trimethoprim (BACTRIM DS) 800-160 MG tablet Take 1 tablet by mouth 2 (two) times daily for 7 days. 07/18/21 07/25/21 Yes Jacqueline Spofford-Warren, Alda Lea, NP  acyclovir (ZOVIRAX) 400  MG tablet TAKE 1 TABLET BY MOUTH TWICE A DAY 01/07/21   Derek Jack, MD  ALPRAZolam Duanne Moron) 1 MG tablet TAKE 1 TABLET BY MOUTH 3 TIMES DAILY AS NEEDED FOR ANXIETY 07/02/21   Lloyd Huger, MD  CALCIUM-VITAMIN D PO Take 1 tablet by mouth 2 (two) times daily.     [provider]  cephALEXin (KEFLEX) 500 MG capsule Take 1 capsule (500 mg total) by mouth 3 (three) times daily. Patient not taking: Reported on 04/25/2021 03/26/21   [name], MD  dexamethasone (DECADRON) 4 MG tablet  02/21/20   [provider]  fluticasone (FLONASE) 50 MCG/ACT nasal spray Place 2 sprays into  both nostrils daily. 10/31/20   Eulogio Bear, NP  furosemide (LASIX) 20 MG tablet TAKE 1 TABLET BY MOUTH ONCE A DAY AS NEEDED 03/14/20   Derek Jack, MD  gabapentin (NEURONTIN) 400 MG capsule TAKE 1 CAPSULE BY MOUTH 3 TIMES A DAY 03/02/21   Lloyd Huger, MD  Homeopathic Products ([place]) Apply topically as needed.    [provider]  ibuprofen (ADVIL) 600 MG tablet TAKE 1 TABLET BY MOUTH EVERY 8 HOURS AS NEEDED 07/02/21   [name], MD  lidocaine-prilocaine (EMLA) cream Apply small amount over port one (1) hour prior to appointment. Patient not taking: Reported on 06/27/2021 09/17/19   Derek Jack, MD  montelukast (SINGULAIR) 10 MG tablet TAKE 1 TABLET BY MOUTH EVERY NIGHT AT BEDTIME 04/29/21   [name], MD  omeprazole (PRILOSEC) 20 MG capsule TAKE 1 CAPSULE BY MOUTH ONCE DAILY 11/25/18   Lockamy, Randi L, NP-C  oxybutynin (DITROPAN-XL) 10 MG 24 hr tablet TAKE 1 TABLET BY MOUTH EVERY NIGHT AT BEDTIME 06/20/21   [name], MD  Oxycodone HCl 10 MG TABS TAKE 1 TO 2 TABLETS BY MOUTH 3 TIMES DAILY AS NEEDED 07/02/21   Lloyd Huger, MD  predniSONE (DELTASONE) 20 MG tablet Take 2 tablets (40 mg total) by mouth daily with breakfast. Patient not taking: Reported on 06/27/2021 03/05/21   [name], MD  senna-docusate (SENOKOT-S) 8.6-50 MG tablet Take 1 tablet by mouth 2 (two) times daily. 11/30/18   Roger Shelter, FNP  silver sulfADIAZINE (SILVADENE) 1 % cream Apply 1 application topically daily. 06/14/20   [name], MD  tamsulosin (FLOMAX) 0.4 MG CAPS capsule TAKE ONE CAPSULE BY MOUTH EVERY NIGHT ATBEDTIME 01/07/21   [name], MD  traZODone (DESYREL) 100 MG tablet TAKE 3 TABLETS BY MOUTH AT BEDTIME 07/02/21   Lloyd Huger, MD  triamcinolone cream (KENALOG) 0.1 % APPLY 1 APPLICATION TOPICALLY TWICE A DAY 08/31/20   [name], MD  TURMERIC PO Take by mouth.    [provider]  zolendronic  acid (ZOMETA) 4 MG/5ML injection Inject 4 mg into the vein every 8 (eight) weeks.    [provider]    Family History Family History  Problem Relation Age of Onset   Diabetes Mother    Stroke Mother    Hypertension Father     Social History Social History   Tobacco Use   Smoking status: Never   Smokeless tobacco: Never  Vaping Use   Vaping Use: Never used  Substance Use Topics   Alcohol use: No    Comment: " Not much no more"   Drug use: No     Allergies   Codeine, Morphine and related, and Revlimid [lenalidomide]   Review of Systems Review of Systems  Per HPI  Physical Exam Triage Vital Signs ED Triage Vitals  Enc Vitals Group     BP 07/18/21 0829 (!) 173/111     Pulse Rate 07/18/21 0829 98     Resp 07/18/21 0829 18     Temp 07/18/21 0829 (!) 97.5 F (36.4 C)     Temp Source 07/18/21 0829 Oral     SpO2 07/18/21 0829 96 %     Weight --      Height --      Head Circumference --      Peak Flow --      Pain Score 07/18/21 0831 9     Pain Loc --      Pain Edu? --      Excl. in [place]? --    No data found.  Updated Vital Signs BP (!) 173/111 (BP Location: Right Arm)   Pulse 98   Temp (!) 97.5 F (36.4 C) (Oral)   Resp 18   SpO2 96%   Visual Acuity Right Eye Distance:   Left Eye Distance:   Bilateral Distance:    Right Eye Near:   Left Eye Near:    Bilateral Near:     Physical Exam Vitals and nursing note reviewed.  Constitutional:      General: He is not in acute distress.    Appearance: He is well-developed.  HENT:     Head: Normocephalic and atraumatic.     Right Ear: Tympanic membrane, ear canal and external ear normal.     Left Ear: Tympanic membrane, ear canal and external ear normal.     Nose: Rhinorrhea present.     Mouth/Throat:     Mouth: Mucous membranes are moist.  Eyes:     Conjunctiva/sclera: Conjunctivae normal.     Pupils: Pupils are equal, round, and reactive to light.  Cardiovascular:     Rate and Rhythm: Normal  rate and regular rhythm.     Heart sounds: No murmur heard. Pulmonary:     Effort: Pulmonary effort is normal. No respiratory distress.     Breath sounds: Normal breath sounds.  Abdominal:     Palpations: Abdomen is soft.     Tenderness: There is no abdominal tenderness. There is right CVA tenderness and left CVA tenderness.     Comments: Suprapubic tenderness  Musculoskeletal:        General: No swelling.     Cervical back: Normal range of motion and neck supple.  Skin:    General: Skin is warm and dry.  Neurological:     General: No focal deficit present.     Mental Status: He is alert and oriented to person, place, and time.  Psychiatric:        Mood and Affect: Mood normal.        Behavior: Behavior normal.      UC Treatments / Results  Labs (all labs ordered are listed, but only abnormal results are displayed) Labs Reviewed  POCT URINALYSIS DIP (MANUAL ENTRY) - Abnormal; Notable for the following components:      Result Value   Clarity, UA hazy (*)    Ketones, POC UA small (15) (*)    Blood, UA trace-intact (*)    All other components within normal limits  URINE CULTURE    EKG   Radiology No results found.  Procedures Procedures (including critical care time)  Medications Ordered in UC Medications - No data to display  Initial Impression / Assessment and Plan /  UC Course  I have reviewed the triage vital signs and the nursing notes.  Pertinent labs & imaging results that were available during my care of the patient were reviewed by me and considered in my medical decision making (see chart for details).  Patient presents with urinary complaints for the been present for the past month.  Specifically, he complains of dysuria and suprapubic pressure and tenderness.  On exam, he has moderate bilateral CVA tenderness, but difficult to ascertain if this is new or if this is from his underlying back pain.  His history of BPH, symptoms may be related to this.  In  the interim, will treat patient with Bactrim DS in the interim, urine culture is pending at this time.  Supportive care recommendations were provided to the patient and his spouse.  Patient advised to follow-up with his PCP within the next week and was given strict precautions of when to go to the ER.  Follow-up as needed. Final Clinical Impressions(s) / UC Diagnoses   Final diagnoses:  Urinary tract infection symptoms     Discharge Instructions      -Your urine does show blood, which could indicate a urinary tract infection.  As discussed, urine culture is pending, if the results are negative, you will be contacted and asked to stop the medication prescribed. -Take medications as prescribed. -Increase fluids. -Tylenol for pain, fever, or general discomfort. -Develop a toileting schedule that will allow you to toilet at least every 2 hours. -Avoid caffeine to include tea, soda, and coffee. -Follow-up in the emergency department if you develop fever, chills, worsening abdominal pain, or other concerns.  -Please follow-up with your doctor within the next 5 to 7 days for reevaluation.     ED Prescriptions     Medication Sig Dispense Auth. Provider   sulfamethoxazole-trimethoprim (BACTRIM DS) 800-160 MG tablet Take 1 tablet by mouth 2 (two) times daily for 7 days. 14 tablet Saba Gomm-Warren, Alda Lea, NP      PDMP not reviewed this encounter.   Tish Men, NP 07/18/21 [phone]

## 2021-07-19 LAB — URINE CULTURE: Culture: <10000 — AB

## 2021-07-24 ENCOUNTER — Other Ambulatory Visit: Payer: Self-pay | Admitting: Family Medicine

## 2021-07-24 ENCOUNTER — Other Ambulatory Visit (HOSPITAL_COMMUNITY): Payer: Self-pay | Admitting: Hematology

## 2021-07-24 DIAGNOSIS — C9000 Multiple myeloma not having achieved remission: Secondary | ICD-10-CM

## 2021-07-24 DIAGNOSIS — N400 Enlarged prostate without lower urinary tract symptoms: Secondary | ICD-10-CM

## 2021-07-25 NOTE — Telephone Encounter (Signed)
Refilled 01/07/2021 #30 11 refills - a 1 year supply. Requested Prescriptions  Pending Prescriptions Disp Refills  . tamsulosin (FLOMAX) 0.4 MG CAPS capsule [Pharmacy Med Name: TAMSULOSIN HCL 0.4 MG CAP] 30 capsule 11    Sig: TAKE ONE CAPSULE BY MOUTH EVERY NIGHT ATBEDTIME     Urology: Alpha-Adrenergic Blocker Failed - 07/24/2021  3:49 PM      Failed - PSA in normal range and within 360 days    PSA  Date Value Ref Range Status  12/01/2012 3.01 <=4.00 ng/mL Final    Comment:    (NOTE) Test Methodology: ECLIA PSA (Electrochemiluminescence Immunoassay) For PSA values from 2.5-4.0, particularly in younger men <49 years old, the AUA and NCCN suggest testing for % Free PSA (3515) and evaluation of the rate of increase in PSA (PSA velocity). Performed at [facility]         Failed - Last BP in normal range    BP Readings from Last 1 Encounters:  07/18/21 (!) 173/111         Passed - Valid encounter within last 12 months    Recent Outpatient Visits          4 months ago [place] [name], MD   4 months ago Hypotension due to hypovolemia   [place], Warren T, MD   4 months ago Hypotension due to hypovolemia   [place] [name], MD   4 months ago Hypotension due to hypovolemia   [place], NP   8 months ago Seasonal allergic rhinitis due to pollen   [place], NP      Future Appointments            In 4 days Pickard, Cammie Mcgee, MD [place]

## 2021-07-29 ENCOUNTER — Ambulatory Visit: Payer: Medicare Other | Admitting: Family Medicine

## 2021-07-31 ENCOUNTER — Other Ambulatory Visit: Payer: Self-pay | Admitting: Oncology

## 2021-07-31 DIAGNOSIS — C9000 Multiple myeloma not having achieved remission: Secondary | ICD-10-CM

## 2021-08-01 ENCOUNTER — Ambulatory Visit
Admission: RE | Admit: 2021-08-01 | Discharge: 2021-08-01 | Disposition: A | Discharge status: Home / Self Care | Payer: Medicare Other | Source: Ambulatory Visit | Attending: Oncology | Admitting: Oncology

## 2021-08-01 DIAGNOSIS — M25551 Pain in right hip: Secondary | ICD-10-CM

## 2021-08-01 DIAGNOSIS — M1611 Unilateral primary osteoarthritis, right hip: Secondary | ICD-10-CM | POA: Diagnosis not present

## 2021-08-01 DIAGNOSIS — M5126 Other intervertebral disc displacement, lumbar region: Secondary | ICD-10-CM | POA: Diagnosis not present

## 2021-08-01 DIAGNOSIS — M25451 Effusion, right hip: Secondary | ICD-10-CM | POA: Diagnosis not present

## 2021-08-01 DIAGNOSIS — M545 Low back pain, unspecified: Secondary | ICD-10-CM | POA: Diagnosis not present

## 2021-08-01 DIAGNOSIS — C9000 Multiple myeloma not having achieved remission: Secondary | ICD-10-CM | POA: Insufficient documentation

## 2021-08-01 DIAGNOSIS — R600 Localized edema: Secondary | ICD-10-CM | POA: Diagnosis not present

## 2021-08-01 DIAGNOSIS — M48061 Spinal stenosis, lumbar region without neurogenic claudication: Secondary | ICD-10-CM | POA: Diagnosis not present

## 2021-08-01 MED ORDER — GADOBUTROL 1 MMOL/ML IV SOLN
7.0000 mL | Freq: Once | INTRAVENOUS | Status: AC | PRN
  Administered 2021-08-01: 7 mL via INTRAVENOUS

## 2021-08-03 DIAGNOSIS — R531 Weakness: Secondary | ICD-10-CM | POA: Diagnosis not present

## 2021-08-03 DIAGNOSIS — W19XXXA Unspecified fall, initial encounter: Secondary | ICD-10-CM | POA: Diagnosis not present

## 2021-08-07 ENCOUNTER — Inpatient Hospital Stay: Payer: Medicare Other | Attending: Oncology

## 2021-08-07 ENCOUNTER — Telehealth: Payer: Self-pay | Admitting: Nurse Practitioner

## 2021-08-07 ENCOUNTER — Inpatient Hospital Stay: Payer: Medicare Other | Admitting: Occupational Therapy

## 2021-08-07 ENCOUNTER — Inpatient Hospital Stay (HOSPITAL_BASED_OUTPATIENT_CLINIC_OR_DEPARTMENT_OTHER): Payer: Medicare Other | Admitting: Oncology

## 2021-08-07 ENCOUNTER — Encounter: Payer: Self-pay | Admitting: Oncology

## 2021-08-07 ENCOUNTER — Inpatient Hospital Stay (HOSPITAL_BASED_OUTPATIENT_CLINIC_OR_DEPARTMENT_OTHER): Payer: Medicare Other | Admitting: Hospice and Palliative Medicine

## 2021-08-07 ENCOUNTER — Inpatient Hospital Stay: Payer: Medicare Other

## 2021-08-07 VITALS — BP 122/56 | HR 62 | Temp 97.3°F | Resp 16

## 2021-08-07 VITALS — BP 122/53 | HR 75 | Temp 97.2°F | Resp 16 | Ht 68.0 in | Wt 168.0 lb

## 2021-08-07 DIAGNOSIS — Z5112 Encounter for antineoplastic immunotherapy: Secondary | ICD-10-CM | POA: Insufficient documentation

## 2021-08-07 DIAGNOSIS — M545 Low back pain, unspecified: Secondary | ICD-10-CM

## 2021-08-07 DIAGNOSIS — M6281 Muscle weakness (generalized): Secondary | ICD-10-CM

## 2021-08-07 DIAGNOSIS — C9000 Multiple myeloma not having achieved remission: Secondary | ICD-10-CM | POA: Diagnosis not present

## 2021-08-07 DIAGNOSIS — Z79899 Other long term (current) drug therapy: Secondary | ICD-10-CM | POA: Insufficient documentation

## 2021-08-07 LAB — CBC WITH DIFFERENTIAL/PLATELET
Abs Immature Granulocytes: 0.01 10*3/uL (ref 0.00–0.07)
Basophils Absolute: 0 10*3/uL (ref 0.0–0.1)
Basophils Relative: 0 %
Eosinophils Absolute: 0 10*3/uL (ref 0.0–0.5)
Eosinophils Relative: 1 %
HCT: 34.9 % — ABNORMAL LOW (ref 39.0–52.0)
Hemoglobin: 11.4 g/dL — ABNORMAL LOW (ref 13.0–17.0)
Immature Granulocytes: 0 %
Lymphocytes Relative: 29 %
Lymphs Abs: 1.2 10*3/uL (ref 0.7–4.0)
MCH: 30.5 pg (ref 26.0–34.0)
MCHC: 32.7 g/dL (ref 30.0–36.0)
MCV: 93.3 fL (ref 80.0–100.0)
Monocytes Absolute: 0.4 10*3/uL (ref 0.1–1.0)
Monocytes Relative: 10 %
Neutro Abs: 2.5 10*3/uL (ref 1.7–7.7)
Neutrophils Relative %: 60 %
Platelets: 148 10*3/uL — ABNORMAL LOW (ref 150–400)
RBC: 3.74 MIL/uL — ABNORMAL LOW (ref 4.22–5.81)
RDW: 13.9 % (ref 11.5–15.5)
WBC: 4.2 10*3/uL (ref 4.0–10.5)
nRBC: 0 % (ref 0.0–0.2)

## 2021-08-07 LAB — COMPREHENSIVE METABOLIC PANEL
ALT: 10 U/L (ref 0–44)
AST: 25 U/L (ref 15–41)
Albumin: 2.7 g/dL — ABNORMAL LOW (ref 3.5–5.0)
Alkaline Phosphatase: 22 U/L — ABNORMAL LOW (ref 38–126)
Anion gap: 6 (ref 5–15)
BUN: 24 mg/dL — ABNORMAL HIGH (ref 8–23)
CO2: 29 mmol/L (ref 22–32)
Calcium: 8.1 mg/dL — ABNORMAL LOW (ref 8.9–10.3)
Chloride: 101 mmol/L (ref 98–111)
Creatinine, Ser: 1.02 mg/dL (ref 0.61–1.24)
GFR, Estimated: >60 mL/min (ref >60)
Glucose, Bld: 134 mg/dL — ABNORMAL HIGH (ref 70–99)
Potassium: 3.5 mmol/L (ref 3.5–5.1)
Sodium: 136 mmol/L (ref 135–145)
Total Bilirubin: 0.6 mg/dL (ref 0.3–1.2)
Total Protein: 9 g/dL — ABNORMAL HIGH (ref 6.5–8.1)

## 2021-08-07 LAB — MAGNESIUM: Magnesium: 1.9 mg/dL (ref 1.7–2.4)

## 2021-08-07 MED ORDER — SODIUM CHLORIDE 0.9 % IV SOLN
Freq: Once | INTRAVENOUS | Status: DC
  Filled 2021-08-07: qty 250

## 2021-08-07 MED ORDER — FENTANYL 12 MCG/HR TD PT72: 1.0000 | MEDICATED_PATCH | TRANSDERMAL | 0 refills | Status: DC

## 2021-08-07 MED ORDER — ZOLEDRONIC ACID 4 MG/100ML IV SOLN
4.0000 mg | Freq: Once | INTRAVENOUS | Status: AC
  Administered 2021-08-07: 4 mg via INTRAVENOUS
  Filled 2021-08-07: qty 100

## 2021-08-07 MED ORDER — DEXAMETHASONE 4 MG PO TABS
20.0000 mg | ORAL_TABLET | Freq: Once | ORAL | Status: AC
  Administered 2021-08-07: 20 mg via ORAL
  Filled 2021-08-07: qty 5

## 2021-08-07 MED ORDER — SODIUM CHLORIDE 0.9 % IV SOLN
Freq: Once | INTRAVENOUS | Status: AC
  Filled 2021-08-07: qty 250

## 2021-08-07 MED ORDER — DIPHENHYDRAMINE HCL 25 MG PO CAPS
25.0000 mg | ORAL_CAPSULE | Freq: Once | ORAL | Status: AC
  Administered 2021-08-07: 25 mg via ORAL
  Filled 2021-08-07: qty 1

## 2021-08-07 MED ORDER — HEPARIN SOD (PORK) LOCK FLUSH 100 UNIT/ML IV SOLN
500.0000 [IU] | Freq: Once | INTRAVENOUS | Status: AC | PRN
  Administered 2021-08-07: 500 [IU]
  Filled 2021-08-07: qty 5

## 2021-08-07 MED ORDER — DARATUMUMAB-HYALURONIDASE-FIHJ 1800-30000 MG-UT/15ML ~~LOC~~ SOLN
1800.0000 mg | Freq: Once | SUBCUTANEOUS | Status: AC
  Administered 2021-08-07: 1800 mg via SUBCUTANEOUS
  Filled 2021-08-07: qty 15

## 2021-08-07 MED ORDER — NALOXONE HCL 4 MG/0.1ML NA LIQD: NASAL | 0 refills | Status: DC

## 2021-08-07 MED ORDER — ACETAMINOPHEN 325 MG PO TABS
650.0000 mg | ORAL_TABLET | Freq: Once | ORAL | Status: AC
  Administered 2021-08-07: 650 mg via ORAL
  Filled 2021-08-07: qty 2

## 2021-08-07 MED ORDER — PROCHLORPERAZINE MALEATE 10 MG PO TABS
10.0000 mg | ORAL_TABLET | Freq: Once | ORAL | Status: AC
  Administered 2021-08-07: 10 mg via ORAL
  Filled 2021-08-07: qty 1

## 2021-08-07 NOTE — Progress Notes (Signed)
10:41 Pt last received zometa 07/10/2021. Ok to proceed with zometa today with calcium 8.1, per [name]. Pt to be montiored 15 minutes post Darzalex injection.

## 2021-08-07 NOTE — Progress Notes (Signed)
[facility]  [phone] [phone]) [phone]  ID: Dean [name]. OB: 12/26/1935  MR#: [id number]  S[facility]#:[id number]  Patient Care Team: [name], MD as PCP - General (Family Medicine) Gery Pray, MD as Consulting Physician (Radiation Oncology) Danie Binder, MD (Inactive) as Consulting Physician (Gastroenterology) Michael Boston, MD as Consulting Physician (General Surgery) Lloyd Huger, MD as Consulting Physician (Oncology)  CHIEF COMPLAINT: Multiple myeloma  INTERVAL HISTORY: Patient returns to clinic today for repeat laboratory, further evaluation, and continuation of daratumumab.  He continues to have significant back and right hip pain, now also complaining of left hip pain.  He has no neurologic complaints.  He denies any recent fevers or illnesses.  He has a good appetite and denies weight loss.  He has no chest pain, shortness of breath, cough, or hemoptysis.  He denies any nausea, vomiting, constipation, or diarrhea.  He has no urinary complaints.  Patient offers no further specific complaints today.  REVIEW OF SYSTEMS:   Review of Systems  Constitutional: Negative.  Negative for fever, malaise/fatigue and weight loss.  Respiratory: Negative.  Negative for cough, hemoptysis and shortness of breath.   Cardiovascular: Negative.  Negative for chest pain and leg swelling.  Gastrointestinal: Negative.  Negative for abdominal pain and nausea.  Genitourinary: Negative.  Negative for dysuria.  Musculoskeletal:  Positive for back pain, falls and joint pain.  Skin: Negative.  Negative for rash.  Neurological:  Positive for sensory change. Negative for dizziness, focal weakness, weakness and headaches.  Psychiatric/Behavioral: Negative.  The patient is not nervous/anxious and does not have insomnia.     As per HPI. Otherwise, a complete review of systems is negative.  PAST MEDICAL HISTORY: Past Medical History:  Diagnosis Date   BPH  (benign prostatic hyperplasia)    Colonic diverticular abscess 01/08/2015   Diverticulitis 66/59/93   complicated by abscess and required percutaneous drainage   GERD (gastroesophageal reflux disease)    H/O ETOH abuse    History of chemotherapy last done jan 2017   History of radiation therapy 01/05/13-02/10/13   45 gray to left occipital condyle region   History of radiation therapy 12/01/16-12/10/16   Parasternal nodule, chest- 24 Gy total delivered in 8 fractions, Left sacro-iliac, pelvis- 24 Gy total delivered in 8 fractions    History of radiation therapy 02/19/17-02/22/17   right temporal scalp 30 Gy in 10 fractions   Intra-abdominal abscess (HCC)    Multiple myeloma ([facility]) 2015   Neuropathy    Radiation 02/21/14-03/08/14   right posterior chest wall area 30 gray   Radiation 04/19/14-05/02/14   lumbar spine 25 gray   Skull lesion    Left occipital condyle   Wrist fracture, left    x 2    PAST SURGICAL HISTORY: Past Surgical History:  Procedure Laterality Date   COLONOSCOPY N/A 04/19/2015   Procedure: COLONOSCOPY;  Surgeon: Danie Binder, MD;  Location: [facility];  Service: Endoscopy;  Laterality: N/A;  1:30 PM   CYST EXCISION  1959   tail bone   IR IMAGING GUIDED PORT INSERTION  09/25/2017    FAMILY HISTORY: Family History  Problem Relation Age of Onset   Diabetes Mother    Stroke Mother    Hypertension Father     ADVANCED DIRECTIVES (Y/N):  N  HEALTH MAINTENANCE: Social History   Tobacco Use   Smoking status: Never   Smokeless tobacco: Never  Vaping Use   Vaping Use: Never used  Substance Use  Topics   Alcohol use: No    Comment: " Not much no more"   Drug use: No     Colonoscopy:  PAP:  Bone density:  Lipid panel:  Allergies  Allergen Reactions   Codeine Other (See Comments)    Headache   Morphine And Related Other (See Comments)    Makes him feel weird   Revlimid [Lenalidomide] Other (See Comments)    "Causes me to become weak"    Current  Outpatient Medications  Medication Sig Dispense Refill   acyclovir (ZOVIRAX) 400 MG tablet TAKE 1 TABLET BY MOUTH TWICE A DAY 60 tablet 6   ALPRAZolam (XANAX) 1 MG tablet TAKE 1 TABLET BY MOUTH 3 TIMES DAILY AS NEEDED FOR ANXIETY 90 tablet 0   CALCIUM-VITAMIN D PO Take 1 tablet by mouth 2 (two) times daily.      cetirizine (ZYRTEC) 10 MG tablet Take 1 tablet (10 mg total) by mouth daily. 30 tablet 0   fluticasone (FLONASE) 50 MCG/ACT nasal spray Place 2 sprays into both nostrils daily. 16 g 6   furosemide (LASIX) 20 MG tablet TAKE 1 TABLET BY MOUTH ONCE A DAY AS NEEDED 30 tablet 3   gabapentin (NEURONTIN) 400 MG capsule TAKE 1 CAPSULE BY MOUTH 3 TIMES A DAY 90 capsule 4   Homeopathic Products (THERAWORX RELIEF EX) Apply topically as needed.     ibuprofen (ADVIL) 600 MG tablet TAKE 1 TABLET BY MOUTH EVERY 8 HOURS AS NEEDED 30 tablet 0   montelukast (SINGULAIR) 10 MG tablet TAKE 1 TABLET BY MOUTH EVERY NIGHT AT BEDTIME 30 tablet 3   omeprazole (PRILOSEC) 20 MG capsule TAKE 1 CAPSULE BY MOUTH ONCE DAILY 30 capsule 5   Oxycodone HCl 10 MG TABS TAKE 1 TO 2 TABLETS BY MOUTH 3 TIMES DAILY AS NEEDED 90 tablet 0   senna-docusate (SENOKOT-S) 8.6-50 MG tablet Take 1 tablet by mouth 2 (two) times daily. 60 tablet 5   tamsulosin (FLOMAX) 0.4 MG CAPS capsule TAKE ONE CAPSULE BY MOUTH EVERY NIGHT ATBEDTIME 30 capsule 11   traZODone (DESYREL) 100 MG tablet TAKE 3 TABLETS BY MOUTH AT BEDTIME 90 tablet 1   TURMERIC PO Take by mouth.     zolendronic acid (ZOMETA) 4 MG/5ML injection Inject 4 mg into the vein every 8 (eight) weeks.     cephALEXin (KEFLEX) 500 MG capsule Take 1 capsule (500 mg total) by mouth 3 (three) times daily. (Patient not taking: Reported on 04/25/2021) 42 capsule 0   dexamethasone (DECADRON) 4 MG tablet  (Patient not taking: Reported on 06/27/2021)     fentaNYL (DURAGESIC) 12 MCG/HR Place 1 patch onto the skin every 3 (three) days. 10 patch 0   lidocaine-prilocaine (EMLA) cream Apply small  amount over port one (1) hour prior to appointment. (Patient not taking: Reported on 06/27/2021) 30 g 0   naloxone (NARCAN) nasal spray 4 mg/0.1 mL SPRAY 1 SPRAY INTO ONE NOSTRIL AS DIRECTED FOR OPIOID OVERDOSE (TURN PERSON ON SIDE AFTER DOSE. IF NO RESPONSE IN 2-3 MINUTES OR PERSON RESPONDS BUT RELAPSES, REPEAT USING A NEW SPRAY DEVICE AND SPRAY INTO THE OTHER NOSTRIL. CALL 911 AFTER USE.) * EMERGENCY USE ONLY * 1 each 0   oxybutynin (DITROPAN-XL) 10 MG 24 hr tablet TAKE 1 TABLET BY MOUTH EVERY NIGHT AT BEDTIME (Patient not taking: Reported on 08/07/2021) 90 tablet 0   predniSONE (DELTASONE) 20 MG tablet Take 2 tablets (40 mg total) by mouth daily with breakfast. (Patient not taking: Reported on 06/27/2021) 10  tablet 0   silver sulfADIAZINE (SILVADENE) 1 % cream Apply 1 application topically daily. (Patient not taking: Reported on 08/07/2021) 50 g 0   triamcinolone cream (KENALOG) 0.1 % APPLY 1 APPLICATION TOPICALLY TWICE A DAY (Patient not taking: Reported on 08/07/2021) 30 g 0   Current Facility-Administered Medications  Medication Dose Route Frequency Provider Last Rate Last Admin   0.9 %  sodium chloride infusion   Intravenous Once Eulogio Bear, NP       Facility-Administered Medications Ordered in Other Visits  Medication Dose Route Frequency Provider Last Rate Last Admin   0.9 %  sodium chloride infusion   Intravenous Once Derek Jack, MD       heparin lock flush 100 unit/mL  500 Units Intravenous Once Lloyd Huger, MD       heparin lock flush 100 unit/mL  500 Units Intracatheter Once PRN Lloyd Huger, MD        OBJECTIVE: Vitals:   08/07/21 0800 08/07/21 0843  BP: (!) 122/53   Pulse: 75   Resp: 16 16  Temp: (!) 97.2 F (36.2 C)   SpO2: 98%       Body mass index is 25.54 kg/m.    ECOG FS:2 - Symptomatic, <50% confined to bed  General: Well-developed, well-nourished, no acute distress.  Sitting in a wheelchair. Eyes: Pink conjunctiva, anicteric  sclera. HEENT: Normocephalic, moist mucous membranes. Lungs: No audible wheezing or coughing. Heart: Regular rate and rhythm. Abdomen: Soft, nontender, no obvious distention. Musculoskeletal: No edema, cyanosis, or clubbing. Neuro: Alert, answering all questions appropriately. Cranial nerves grossly intact. Skin: No rashes or petechiae noted. Psych: Normal affect.   LAB RESULTS:  Lab Results  Component Value Date   NA 136 08/07/2021   K 3.5 08/07/2021   CL 101 08/07/2021   CO2 29 08/07/2021   GLUCOSE 134 (H) 08/07/2021   BUN 24 (H) 08/07/2021   CREATININE 1.02 08/07/2021   CALCIUM 8.1 (L) 08/07/2021   PROT 9.0 (H) 08/07/2021   ALBUMIN 2.7 (L) 08/07/2021   AST 25 08/07/2021   ALT 10 08/07/2021   ALKPHOS 22 (L) 08/07/2021   BILITOT 0.6 08/07/2021   GFRNONAA >60 08/07/2021   GFRAA >60 10/13/2019    Lab Results  Component Value Date   WBC 4.2 08/07/2021   NEUTROABS 2.5 08/07/2021   HGB 11.4 (L) 08/07/2021   HCT 34.9 (L) 08/07/2021   MCV 93.3 08/07/2021   PLT 148 (L) 08/07/2021     STUDIES: MR HIP RIGHT W WO CONTRAST  Result Date: 08/01/2021 CLINICAL DATA:  Lower back pain, falls, history of multiple myeloma, pain EXAM: MRI OF THE RIGHT HIP WITHOUT AND WITH CONTRAST TECHNIQUE: Multiplanar, multisequence MR imaging was performed both before and after administration of intravenous contrast. CONTRAST:  79m GADAVIST GADOBUTROL 1 MMOL/ML IV SOLN COMPARISON:  Hip radiograph 01/15/2021 FINDINGS: Bones: There are numerous T2 hyperintense/T1 hypointense enhancing lesions of varying sizes throughout the pelvis and proximal femurs as well as the lower lumbar spine which is partially visualized. Of note, the proximal left femoral diaphysis appears entirely placed with tumor. There are lesions in the intertrochanteric regions and in the femoral necks. There is no evidence of fracture. There is flattening of the right femoral head. Articular cartilage and labrum Articular cartilage:  Full-thickness cartilage loss of the right hip. Intermediate to high-grade cartilage loss along the left hip. Labrum: Degenerated and torn labrum with residual superior labral tissue posteriorly. Joint or bursal effusion Joint effusion: Moderate to large right  hip joint effusion. Trace left hip joint effusion. Bursae: No significant trochanteric bursitis. Muscles and tendons Muscles and tendons: The right gluteal tendons are intact. Chronic degenerative partial tearing of the left gluteus minimus tendon at the greater trochanter. The left gluteus medius tendon is intact. Mild tendinosis of the proximal hamstrings, right greater than left.There is reactive intramuscular edema within the hip adductors bilaterally, right greater than left. There is bilateral gluteus minimus muscle atrophy. Other findings Miscellaneous: Enlarged prostate gland. Markedly distended bladder with multiple diverticula. Colonic diverticulosis. IMPRESSION: Numerous enhancing lesions of varying sizes throughout the pelvis and proximal femurs, consistent with history of multiple myeloma. Of note, there is a lesion completely replacing the left proximal femoral diaphysis and there are lesions in the intertrochanteric regions and the femoral necks, potentially at risk for pathologic fracture. Severe right hip osteoarthritis with full-thickness cartilage loss and flattening of the femoral head. Degenerated and torn hip labrum. Chronic bilateral obstruction due to an enlarged prostate gland. Electronically Signed   By: Maurine Simmering M.D.   On: 08/01/2021 16:04   MR Lumbar Spine W Wo Contrast  Result Date: 08/01/2021 CLINICAL DATA:  low back pain, multiple myeloma. EXAM: MRI LUMBAR SPINE WITHOUT AND WITH CONTRAST TECHNIQUE: Multiplanar and multiecho pulse sequences of the lumbar spine were obtained without and with intravenous contrast. CONTRAST:  62m GADAVIST GADOBUTROL 1 MMOL/ML IV SOLN COMPARISON:  December 2021 FINDINGS: Segmentation:   Standard. Alignment: Stable with minor degenerative listhesis and levocurvature. Vertebrae: Vertebral body heights are similar. There is multifocal abnormal marrow signal. New foci are identified for example at T11, T12, L1, and L5. Involvement of included bony pelvis again noted. Conus medullaris and cauda equina: Conus extends to the L1 level. Conus and cauda equina appear normal. No abnormal intrathecal enhancement. Paraspinal and other soft tissues: Bladder is distended with numerous diverticula. Disc levels: L1-L2: Disc bulge with endplate osteophytic ridging eccentric to the right. No canal or left foraminal stenosis. Mild right foraminal stenosis. L2-L3: Disc bulge with endplate osteophytic ridging. Mild facet arthropathy. Minor canal stenosis. Mild to moderate foraminal stenosis. L3-L4: Disc bulge with endplate osteophytic ridging. Moderate facet arthropathy with ligamentum flavum infolding. Moderate to marked canal stenosis. Narrowing of the subarticular recesses. Moderate to marked right and moderate left foraminal stenosis. L4-L5: Disc bulge with endplate osteophytic ridging eccentric to the left. Moderate facet arthropathy with ligamentum infolding. No canal or right foraminal stenosis. Slightly increased moderate to marked left foraminal stenosis. L5-S1: Disc bulge with endplate osteophytic ridging. Mild facet arthropathy. No canal or foraminal stenosis. IMPRESSION: Multifocal abnormal marrow signal reflecting known myeloma. There are new areas of involvement compared to the 2021 study. No new compression fracture. No significant epidural disease. Multilevel degenerative changes as detailed above are similar to the prior study. Canal stenosis remains greatest at L3-L4. Foraminal stenosis remains greatest at L3-L4 and L4-L5. Distended bladder with numerous diverticula. Electronically Signed   By: PMacy MisM.D.   On: 08/01/2021 11:29    ASSESSMENT: Multiple myeloma  PLAN:  Multiple myeloma:  Please see note from September 17, 2020 for historic details of his myeloma and treatment.  Patient's most recent M spike has increased significantly to 2.8 and his IgG immunoglobulin component is now 3658.  Kappa chains have increased to 112.5.  Previously, patient was receiving daratumumab every 4 weeks and Zometa every 8 weeks, but patient requested a chemotherapy holiday and did not receive any treatment between January 15, 2021 and July 10, 2021.  Proceed with daratumumab and  Zometa today.  Given the new lesions seen on recent MRI, will give Zometa every 4 weeks at this point.  Return to clinic in 4 weeks for further evaluation and continuation of treatment.       Thrombocytopenia: Platelet count improved to 148.   Back/joint pain: MRI results reviewed independently and report as above consistent with progressive myeloma.  No pathologic fractures reported.  Patient was given a referral to radiation oncology for further evaluation.  He also had consultation with palliative care today.  Continue current narcotics as prescribed.     Peripheral neuropathy: Chronic and unchanged.  Continue gabapentin as prescribed. Insomnia/anxiety: Chronic and unchanged.  Patient is now having his medications adjusted by primary care.     Patient expressed understanding and was in agreement with this plan. He also understands that He can call clinic at any time with any questions, concerns, or complaints.    Cancer Staging  No matching staging information was found for the patient.  Lloyd Huger, MD   08/07/2021 1:38 PM

## 2021-08-07 NOTE — Patient Instructions (Signed)
[facility] CTR AT [place]  Discharge Instructions: Thank you for choosing [facility] to provide your oncology and hematology care.  If you have a lab appointment with the [facility], please go directly to the [facility] and check in at the registration area.  Wear comfortable clothing and clothing appropriate for easy access to any Portacath or PICC line.   We strive to give you quality time with your provider. You may need to reschedule your appointment if you arrive late (15 or more minutes).  Arriving late affects you and other patients whose appointments are after yours.  Also, if you miss three or more appointments without notifying the office, you may be dismissed from the clinic at the provider's discretion.      For prescription refill requests, have your pharmacy contact our office and allow 72 hours for refills to be completed.    Today you received the following chemotherapy and/or immunotherapy agents Darzalex & Zometa      To help prevent nausea and vomiting after your treatment, we encourage you to take your nausea medication as directed.  BELOW ARE SYMPTOMS THAT SHOULD BE REPORTED IMMEDIATELY: *FEVER GREATER THAN 100.4 F (38 C) OR HIGHER *CHILLS OR SWEATING *NAUSEA AND VOMITING THAT IS NOT CONTROLLED WITH YOUR NAUSEA MEDICATION *UNUSUAL SHORTNESS OF BREATH *UNUSUAL BRUISING OR BLEEDING *URINARY PROBLEMS (pain or burning when urinating, or frequent urination) *BOWEL PROBLEMS (unusual diarrhea, constipation, pain near the anus) TENDERNESS IN MOUTH AND THROAT WITH OR WITHOUT PRESENCE OF ULCERS (sore throat, sores in mouth, or a toothache) UNUSUAL RASH, SWELLING OR PAIN  UNUSUAL VAGINAL DISCHARGE OR ITCHING   Items with * indicate a potential emergency and should be followed up as soon as possible or go to the Emergency Department if any problems should occur.  Please show the CHEMOTHERAPY ALERT CARD or IMMUNOTHERAPY ALERT CARD at  check-in to the Emergency Department and triage nurse.  Should you have questions after your visit or need to cancel or reschedule your appointment, please contact [facility] CANCER [place] AT [place]  [phone] and follow the prompts.  Office hours are 8:00 a.m. to 4:30 p.m. Monday - Friday. Please note that voicemails left after 4:00 p.m. may not be returned until the following business day.  We are closed weekends and major holidays. You have access to a nurse at all times for urgent questions. Please call the main number to the clinic [phone] and follow the prompts.  For any non-urgent questions, you may also contact your provider using MyChart. We now offer e-Visits for anyone 84 and older to request care online for non-urgent symptoms. For details visit [email or web address].   Also download the MyChart app! Go to the app store, search "MyChart", open the app, select , and log in with your MyChart username and password.  Masks are optional in the cancer centers. If you would like for your care team to wear a mask while they are taking care of you, please let them know. For doctor visits, patients may have with them one support person who is at least 84 years old. At this time, visitors are not allowed in the infusion area.

## 2021-08-07 NOTE — Therapy (Signed)
[facility] at [facility] [address], [place], [place], [address] Phone: [phone]   Fax:  [phone]  Occupational Therapy Screen:  Patient Details  Name: Dean [name]. MRN: [id number] Date of Birth: 1935-02-27 No data recorded  Encounter Date: 08/07/2021   OT End of Session - 08/07/21 1712     Visit Number 0             Past Medical History:  Diagnosis Date   BPH (benign prostatic hyperplasia)    Colonic diverticular abscess 01/08/2015   Diverticulitis 01/74/94   complicated by abscess and required percutaneous drainage   GERD (gastroesophageal reflux disease)    H/O ETOH abuse    History of chemotherapy last done jan 2017   History of radiation therapy 01/05/13-02/10/13   45 gray to left occipital condyle region   History of radiation therapy 12/01/16-12/10/16   Parasternal nodule, chest- 24 Gy total delivered in 8 fractions, Left sacro-iliac, pelvis- 24 Gy total delivered in 8 fractions    History of radiation therapy 02/19/17-02/22/17   right temporal scalp 30 Gy in 10 fractions   Intra-abdominal abscess (HCC)    Multiple myeloma ([place]) 2015   Neuropathy    Radiation 02/21/14-03/08/14   right posterior chest wall area 30 gray   Radiation 04/19/14-05/02/14   lumbar spine 25 gray   Skull lesion    Left occipital condyle   Wrist fracture, left    x 2    Past Surgical History:  Procedure Laterality Date   COLONOSCOPY N/A 04/19/2015   Procedure: COLONOSCOPY;  Surgeon: Danie Binder, MD;  Location: [facility];  Service: Endoscopy;  Laterality: N/A;  1:30 PM   CYST EXCISION  1959   tail bone   IR IMAGING GUIDED PORT INSERTION  09/25/2017    There were no vitals filed for this visit.   Subjective Assessment - 08/07/21 1709     Subjective  I fell the last time 2 weeks ago.  Before that I had so many falls and my daughter is hiding my cane so I can use the walker in the house.  #1 daughter bring some meals to her  so that is not a problem.  But the shower chair I cannot watch my feet when I put in the shower.  One of my friends computer right in the shower.  My significant other has dementia and thinks is just very cluttered in the house I had home health until about 3 months ago and was doing okay but then had increased pain since then.    Currently in Pain? Yes    Pain Score 8     Pain Location Back    Pain Orientation Lower    Pain Descriptors / Indicators Tightness;Aching    Pain Type Chronic pain;Acute pain             Note of Vonna Kotyk Borders NP 08/07/21: IMPRESSION: I met with patient following his visit with [name].  Patient is pending treatment today with daratumumab.   Patient has had significant persistent pain in bilateral hips and back secondary to his myeloma.  Recent imaging including MRI to the lumbar spine and right hip did not show fractures but patient does have multiple enhancing bony lesions.  Patient has been referred for XRT.   Patient is taking oxycodone 10 mg as needed.  He does find this helps but says that he is only taking it on average once a day  due to fear that he will get "hooked."  He does reported effective but short-lived, only lasting 3 to 4 hours.  He also reports some constipating effects.  Discussed the option of starting him on a long-acting opioid and patient verbalized agreement.  We will trial low-dose fentanyl patch.  Patient can continue taking oxycodone as needed for breakthrough pain.  Discussed that he will likely require daily bowel regimen to prevent opioid-induced constipation.   Patient ambulates with use of a walker.  He does report frequent falls in the past (last about 2 weeks ago).  We will see if Gwenette Greet can evaluate.   PLAN: -Continue current scope of treatment -Start transdermal fentanyl 12.5 mcg every 72 hours -Continue oxycodone IR as needed for breakthrough pain -Naloxone spray. -PDMP reviewed -Daily bowel regimen -Referral for  rehab screening -Referral for community palliative care -He will benefit from MOST form -RTC 1 month   OT SCREEN 08/07/21:   Patient referred by palliative care with today because of frequent falls with the last 1 to 2 weeks ago.  Patient was seen the last time by this OT in January/23.  At that time patient was referred for home health PT and OT. Patient with increased pain in lower back and hips limiting his activity level and causing frequent falls.  Palliative care did increase his pain medicine today.  Patient also starting radiation for pain management.  Patient reports he had physical therapy at home that he finished about 3 months ago. He was doing better but then had increased pain and was not able to do his home exercises since then. Patient has a shower chair but it is too big for the shower and he cannot wash his feet.  Friend can put rail in shower - walk in shower. Patient using a rolling walker at home. Pt using RW at home. 4 stairs to get in the house. His significant other has advanced dementia but is able to help him with tying his shoes as well as helping and positioning his walker coming up the stairs into the house. Patient was able to do sit to stand independently using hands.  Was able to ambulate holding onto the counter and chair.  Did not perform BERG balance test this date because of pain. HEP: -Community palliative care was ordered to follow him at home -Patient still has handout from PT 3 months ago.-Feels if pain medicine is working he can start back with exercises. -Patient to use rolling walker and ask family to attach with took his reacher to walker -Recommended a hip kit that has a sock aid as well as a long-handle brush or sponge to bathe feet and shower so he can use a shower chair in the shower. -Fall prevention handout was reviewed with patient making sure having an nightlight as well as picking up loose carpets in the house -His 1 daughter bring them some meals  few times a week-declined Me[employer] Wheels  Pt to follow-up with me in 5 weeks again.                                   Visit Diagnosis: Low back pain without sciatica, unspecified back pain laterality, unspecified chronicity  Muscle weakness (generalized)    Problem List Patient Active Problem List   Diagnosis Date Noted   Spinal stenosis, lumbar 10/13/2016   Chronic back pain 10/01/2016   DDD (degenerative disc disease),  lumbar 10/01/2016   OA (osteoarthritis) of knee 10/01/2016   Diverticulitis s/p robotic sigmoid colectomy 07/11/2015 07/11/2015   Goals of care, counseling/discussion    Abnormal CT scan, sigmoid colon 04/02/2015   Abnormal PET scan of colon 04/02/2015   Thrombocytopenia ([place]) 01/09/2015   Multiple myeloma without remission ([place]) 02/15/2014   Allergic rhinitis 01/16/2014   History of vertebral stress fracture 01/16/2014   Dermatitis 01/16/2014   Loss of weight 09/05/2013   Insomnia 09/05/2013   Fatigue 05/16/2013   Neck pain 12/07/2012   Skull lesion 11/29/2012   Neuropathy ([place])    BPH (benign prostatic hyperplasia)    GERD (gastroesophageal reflux disease)    H/O ETOH abuse     Rosalyn Gess, OTR/L,CLT 08/07/2021, 5:13 PM  [facility] at [facility] [address] [place], [place], [address] Phone: [phone]   Fax:  [phone]  Name: Dean [name]. MRN: [id number] Date of Birth: 11-09-1935

## 2021-08-07 NOTE — Progress Notes (Signed)
[place] at [facility] [phone] [phone]   Name: Dean [name]. Date: 08/07/2021 MRN: [id number]  DOB: 1935-11-01  Patient Care Team: [name], MD as PCP - General (Family Medicine) Gery Pray, MD as Consulting Physician (Radiation Oncology) Danie Binder, MD (Inactive) as Consulting Physician (Gastroenterology) Michael Boston, MD as Consulting Physician (General Surgery)    REASON FOR CONSULTATION: Dean Hevener. is a 86 y.o. male with multiple medical problems including multiple myeloma with disease progression currently on treatment.  Patient has had worsening pain.  He is referred to palliative care to help address goals and manage ongoing symptoms  SOCIAL HISTORY:     reports that he has never smoked. He has never used smokeless tobacco. He reports that he does not drink alcohol and does not use drugs.  Patient lives at home with his significant other 25 years.  She has dementia and he is her primary caregiver.  Patient has 2 daughters and a son who lives nearby.  Patient previously worked as a Programmer, systems.  ADVANCE DIRECTIVES:  On file  CODE STATUS:   PAST MEDICAL HISTORY: Past Medical History:  Diagnosis Date   BPH (benign prostatic hyperplasia)    Colonic diverticular abscess 01/08/2015   Diverticulitis 38/45/36   complicated by abscess and required percutaneous drainage   GERD (gastroesophageal reflux disease)    H/O ETOH abuse    History of chemotherapy last done jan 2017   History of radiation therapy 01/05/13-02/10/13   45 gray to left occipital condyle region   History of radiation therapy 12/01/16-12/10/16   Parasternal nodule, chest- 24 Gy total delivered in 8 fractions, Left sacro-iliac, pelvis- 24 Gy total delivered in 8 fractions    History of radiation therapy 02/19/17-02/22/17   right temporal scalp 30 Gy in 10 fractions   Intra-abdominal abscess  (HCC)    Multiple myeloma ([place]) 2015   Neuropathy    Radiation 02/21/14-03/08/14   right posterior chest wall area 30 gray   Radiation 04/19/14-05/02/14   lumbar spine 25 gray   Skull lesion    Left occipital condyle   Wrist fracture, left    x 2    PAST SURGICAL HISTORY:  Past Surgical History:  Procedure Laterality Date   COLONOSCOPY N/A 04/19/2015   Procedure: COLONOSCOPY;  Surgeon: Danie Binder, MD;  Location: [facility];  Service: Endoscopy;  Laterality: N/A;  1:30 PM   CYST EXCISION  1959   tail bone   IR IMAGING GUIDED PORT INSERTION  09/25/2017    HEMATOLOGY/ONCOLOGY HISTORY:  Oncology History  Multiple myeloma without remission ([place])  11/16/2012 Initial Diagnosis   L occipital condyle destructive lesion, not biopsied secondary to location. XRT   12/01/2012 Imaging   L3 superior endplate compression FX, no lytic lesions to suggest myeloma   02/06/2014 Imaging   soft tissue mass along the right posterior fifth rib with some rib destruction   02/16/2014 Miscellaneous   Normal CBC, Normal CMP, kappa,lamda ratio of 2.62 (abnormal), UIEP with slightly restricted band, IgG kappa, SPEP/IEP with monoclonal protein at 0.79 g/dl, normal igG, suppressed IGM   02/20/2014 Bone Marrow Biopsy   33% kappa restricted plasma cells Normal FISH, normal cytogenetics   02/21/2014 - 03/08/2014 Radiation Therapy   30Gy to R rib lesion, lesion not biopsied   03/16/2014 PET scan   Scattered hypermetabolic osseous lesions, including the calvarium, ribs, left scapula, sacrum, and left femoral shaft.  An expansile left vertebral body lesion at L3 extends into the epidural space of the left lateral recess,    03/17/2014 - 04/07/2014 Chemotherapy   Velcade and Dexamethasone due to initial denial of Revlimid by insurance company.  Zometa monthly.   03/22/2014 Imaging   MR_ L-Spine- Enhancing lesions compatible with multiple myeloma at L2, L3, and S1-2.   04/07/2014 - 07/28/2014 Chemotherapy   RVD with  Revlimid at 25 mg days 1-14.  Revlimid dose reduced to 25 mg every other day x 14 days with 7 day respite beginning on day 1 of cycle 2.   04/17/2014 Adverse Reaction   Revlimid-induced rash.  Treated with steroids.   07/28/2014 - 08/04/2014 Chemotherapy   Revlimid daily   08/04/2014 Adverse Reaction   Velcade-induced peripheral neuropathy   08/04/2014 Treatment Plan Change   D/C Velcade   08/11/2014 PET scan   Response to therapy. Improvement and resolution of foci of osseous hypermetabolism.   08/22/2014 - 08/28/2014 Chemotherapy   Revlimid 25 mg days 1-21 every 28 days   08/28/2014 Adverse Reaction   Revlimid-induced rash. Medication held.  Medrol dose Pak prescribed.   09/04/2014 - 01/08/2015 Chemotherapy   Revlimid 10 mg every other day, without dexamethasone   01/08/2015 - 01/15/2015 Hospital Admission   Colonic diverticular abscess with IR drain placement   03/02/2015 PET scan   Only bony uptake is low activity at site of deformity/callus at R second rib FX, high activity in sigmoid colon, advanced sigmoid divertidulosis. Abscess not resolved?   06/04/2015 Imaging   CT nonobstructive L renal calculus, diverticulosis of descending and sigmoid colon without inflammation, moderate prostatic enlargement   07/12/2015 Surgery   Diverticulitis s/p robotic sigmoid colectomy with Dr. Johney Maine   08/23/2015 PET scan   Primarily similar hypermetabolic osseous foci within the R sided ribs. new L third rib focus of hypermetab and sclerosis is favored to be related to healing FX. no soft tissue myeloma id'd. Presumed postop hypermetab and edema about the R pelvic wall    03/06/2016 PET scan   1. Reduced activity in the prior lesion such as the right second and fifth rib lesions, activity nearly completely resolved and significantly less than added mediastinal blood pool activity. No new lesions are identified. 2. Other imaging findings of potential clinical significance: Coronary, aortic arch, and branch  vessel atherosclerotic vascular disease. Aortoiliac atherosclerotic vascular disease. Enlarged prostate gland. Colonic diverticula.   02/17/2017 PET scan   HEAD/NECK: No hypermetabolic activity in the scalp. No hypermetabolic cervical lymph nodes. CHEST: No hypermetabolic mediastinal or hilar nodes. Right upper lobe scarring/atelectasis. No suspicious pulmonary nodules on the CT scan. ABDOMEN/PELVIS: No abnormal hypermetabolic activity within the liver, pancreas, adrenal glands, or spleen. No hypermetabolic lymph nodes in the abdomen or pelvis. Atherosclerotic calcifications of the abdominal aorta and branch vessels. Left renal sinus cysts. Sigmoid diverticulosis, without evidence of diverticulitis. Prostatomegaly. SKELETON: Vague hypermetabolism involving the inferior sternum, max SUV 3.2, previously 8.2. Focal hypermetabolism involving the left sacrum, max SUV 4.0, previously 6.7. EXTREMITIES: No abnormal hypermetabolic activity in the lower extremities.   02/18/2017 -  Chemotherapy   Bortezomib 1.75m/m2 QWk + Dexamethasone 180mQTue/Fri --Cycle #1, 02/18/17    02/18/2017 - 08/26/2017 Chemotherapy   The patient had bortezomib SQ (VELCADE) chemo injection 3 mg, 1.3 mg/m2 = 3 mg, Subcutaneous,  Once, 7 of 9 cycles Administration: 3 mg (02/18/2017), 3 mg (02/26/2017), 3 mg (03/04/2017), 3 mg (03/11/2017), 3 mg (04/08/2017), 3 mg (03/18/2017), 3 mg (03/25/2017), 3 mg (04/01/2017),  3 mg (05/06/2017), 3 mg (05/20/2017), 3 mg (06/03/2017), 3 mg (06/17/2017), 3 mg (07/01/2017), 3 mg (07/15/2017), 3 mg (07/29/2017), 3 mg (08/12/2017), 3 mg (08/26/2017)  for chemotherapy treatment.    09/17/2017 -  Chemotherapy   Patient is on Treatment Plan : MYELOMA Daratumumab q28d       ALLERGIES:  is allergic to codeine, morphine and related, and revlimid [lenalidomide].  MEDICATIONS:  Current Outpatient Medications  Medication Sig Dispense Refill   acyclovir (ZOVIRAX) 400 MG tablet TAKE 1 TABLET BY MOUTH TWICE A DAY 60 tablet 6    ALPRAZolam (XANAX) 1 MG tablet TAKE 1 TABLET BY MOUTH 3 TIMES DAILY AS NEEDED FOR ANXIETY 90 tablet 0   CALCIUM-VITAMIN D PO Take 1 tablet by mouth 2 (two) times daily.      cephALEXin (KEFLEX) 500 MG capsule Take 1 capsule (500 mg total) by mouth 3 (three) times daily. (Patient not taking: Reported on 04/25/2021) 42 capsule 0   cetirizine (ZYRTEC) 10 MG tablet Take 1 tablet (10 mg total) by mouth daily. 30 tablet 0   dexamethasone (DECADRON) 4 MG tablet  (Patient not taking: Reported on 06/27/2021)     fluticasone (FLONASE) 50 MCG/ACT nasal spray Place 2 sprays into both nostrils daily. 16 g 6   furosemide (LASIX) 20 MG tablet TAKE 1 TABLET BY MOUTH ONCE A DAY AS NEEDED 30 tablet 3   gabapentin (NEURONTIN) 400 MG capsule TAKE 1 CAPSULE BY MOUTH 3 TIMES A DAY 90 capsule 4   Homeopathic Products (THERAWORX RELIEF EX) Apply topically as needed.     ibuprofen (ADVIL) 600 MG tablet TAKE 1 TABLET BY MOUTH EVERY 8 HOURS AS NEEDED 30 tablet 0   lidocaine-prilocaine (EMLA) cream Apply small amount over port one (1) hour prior to appointment. (Patient not taking: Reported on 06/27/2021) 30 g 0   montelukast (SINGULAIR) 10 MG tablet TAKE 1 TABLET BY MOUTH EVERY NIGHT AT BEDTIME 30 tablet 3   omeprazole (PRILOSEC) 20 MG capsule TAKE 1 CAPSULE BY MOUTH ONCE DAILY 30 capsule 5   oxybutynin (DITROPAN-XL) 10 MG 24 hr tablet TAKE 1 TABLET BY MOUTH EVERY NIGHT AT BEDTIME (Patient not taking: Reported on 08/07/2021) 90 tablet 0   Oxycodone HCl 10 MG TABS TAKE 1 TO 2 TABLETS BY MOUTH 3 TIMES DAILY AS NEEDED 90 tablet 0   predniSONE (DELTASONE) 20 MG tablet Take 2 tablets (40 mg total) by mouth daily with breakfast. (Patient not taking: Reported on 06/27/2021) 10 tablet 0   senna-docusate (SENOKOT-S) 8.6-50 MG tablet Take 1 tablet by mouth 2 (two) times daily. 60 tablet 5   silver sulfADIAZINE (SILVADENE) 1 % cream Apply 1 application topically daily. (Patient not taking: Reported on 08/07/2021) 50 g 0   tamsulosin  (FLOMAX) 0.4 MG CAPS capsule TAKE ONE CAPSULE BY MOUTH EVERY NIGHT ATBEDTIME 30 capsule 11   traZODone (DESYREL) 100 MG tablet TAKE 3 TABLETS BY MOUTH AT BEDTIME 90 tablet 1   triamcinolone cream (KENALOG) 0.1 % APPLY 1 APPLICATION TOPICALLY TWICE A DAY (Patient not taking: Reported on 08/07/2021) 30 g 0   TURMERIC PO Take by mouth.     zolendronic acid (ZOMETA) 4 MG/5ML injection Inject 4 mg into the vein every 8 (eight) weeks.     Current Facility-Administered Medications  Medication Dose Route Frequency Provider Last Rate Last Admin   0.9 %  sodium chloride infusion   Intravenous Once Eulogio Bear, NP       Facility-Administered Medications Ordered in Other Visits  Medication Dose Route Frequency Provider Last Rate Last Admin   heparin lock flush 100 unit/mL  500 Units Intravenous Once Lloyd Huger, MD       heparin lock flush 100 unit/mL  500 Units Intracatheter Once PRN Lloyd Huger, MD        VITAL SIGNS: There were no vitals taken for this visit. There were no vitals filed for this visit.  Estimated body mass index is 25.54 kg/m as calculated from the following:   Height as of an earlier encounter on 08/07/21: 5' 8"  (1.727 m).   Weight as of an earlier encounter on 08/07/21: 168 lb (76.2 kg).  LABS: CBC:    Component Value Date/Time   WBC 4.2 08/07/2021 0823   HGB 11.4 (L) 08/07/2021 0823   HCT 34.9 (L) 08/07/2021 0823   PLT 148 (L) 08/07/2021 0823   MCV 93.3 08/07/2021 0823   NEUTROABS 2.5 08/07/2021 0823   LYMPHSABS 1.2 08/07/2021 0823   MONOABS 0.4 08/07/2021 0823   EOSABS 0.0 08/07/2021 0823   BASOSABS 0.0 08/07/2021 0823   Comprehensive Metabolic Panel:    Component Value Date/Time   NA 136 08/07/2021 0823   K 3.5 08/07/2021 0823   CL 101 08/07/2021 0823   CO2 29 08/07/2021 0823   BUN 24 (H) 08/07/2021 0823   CREATININE 1.02 08/07/2021 0823   CREATININE 0.81 03/26/2021 1117   GLUCOSE 134 (H) 08/07/2021 0823   CALCIUM 8.1 (L) 08/07/2021 0823    AST 25 08/07/2021 0823   ALT 10 08/07/2021 0823   ALKPHOS 22 (L) 08/07/2021 0823   BILITOT 0.6 08/07/2021 0823   PROT 9.0 (H) 08/07/2021 0823   ALBUMIN 2.7 (L) 08/07/2021 0823    RADIOGRAPHIC STUDIES: MR HIP RIGHT W WO CONTRAST  Result Date: 08/01/2021 CLINICAL DATA:  Lower back pain, falls, history of multiple myeloma, pain EXAM: MRI OF THE RIGHT HIP WITHOUT AND WITH CONTRAST TECHNIQUE: Multiplanar, multisequence MR imaging was performed both before and after administration of intravenous contrast. CONTRAST:  28m GADAVIST GADOBUTROL 1 MMOL/ML IV SOLN COMPARISON:  Hip radiograph 01/15/2021 FINDINGS: Bones: There are numerous T2 hyperintense/T1 hypointense enhancing lesions of varying sizes throughout the pelvis and proximal femurs as well as the lower lumbar spine which is partially visualized. Of note, the proximal left femoral diaphysis appears entirely placed with tumor. There are lesions in the intertrochanteric regions and in the femoral necks. There is no evidence of fracture. There is flattening of the right femoral head. Articular cartilage and labrum Articular cartilage: Full-thickness cartilage loss of the right hip. Intermediate to high-grade cartilage loss along the left hip. Labrum: Degenerated and torn labrum with residual superior labral tissue posteriorly. Joint or bursal effusion Joint effusion: Moderate to large right hip joint effusion. Trace left hip joint effusion. Bursae: No significant trochanteric bursitis. Muscles and tendons Muscles and tendons: The right gluteal tendons are intact. Chronic degenerative partial tearing of the left gluteus minimus tendon at the greater trochanter. The left gluteus medius tendon is intact. Mild tendinosis of the proximal hamstrings, right greater than left.There is reactive intramuscular edema within the hip adductors bilaterally, right greater than left. There is bilateral gluteus minimus muscle atrophy. Other findings Miscellaneous: Enlarged  prostate gland. Markedly distended bladder with multiple diverticula. Colonic diverticulosis. IMPRESSION: Numerous enhancing lesions of varying sizes throughout the pelvis and proximal femurs, consistent with history of multiple myeloma. Of note, there is a lesion completely replacing the left proximal femoral diaphysis and there are lesions in the intertrochanteric regions  and the femoral necks, potentially at risk for pathologic fracture. Severe right hip osteoarthritis with full-thickness cartilage loss and flattening of the femoral head. Degenerated and torn hip labrum. Chronic bilateral obstruction due to an enlarged prostate gland. Electronically Signed   By: Maurine Simmering M.D.   On: 08/01/2021 16:04   MR Lumbar Spine W Wo Contrast  Result Date: 08/01/2021 CLINICAL DATA:  low back pain, multiple myeloma. EXAM: MRI LUMBAR SPINE WITHOUT AND WITH CONTRAST TECHNIQUE: Multiplanar and multiecho pulse sequences of the lumbar spine were obtained without and with intravenous contrast. CONTRAST:  73m GADAVIST GADOBUTROL 1 MMOL/ML IV SOLN COMPARISON:  December 2021 FINDINGS: Segmentation:  Standard. Alignment: Stable with minor degenerative listhesis and levocurvature. Vertebrae: Vertebral body heights are similar. There is multifocal abnormal marrow signal. New foci are identified for example at T11, T12, L1, and L5. Involvement of included bony pelvis again noted. Conus medullaris and cauda equina: Conus extends to the L1 level. Conus and cauda equina appear normal. No abnormal intrathecal enhancement. Paraspinal and other soft tissues: Bladder is distended with numerous diverticula. Disc levels: L1-L2: Disc bulge with endplate osteophytic ridging eccentric to the right. No canal or left foraminal stenosis. Mild right foraminal stenosis. L2-L3: Disc bulge with endplate osteophytic ridging. Mild facet arthropathy. Minor canal stenosis. Mild to moderate foraminal stenosis. L3-L4: Disc bulge with endplate osteophytic  ridging. Moderate facet arthropathy with ligamentum flavum infolding. Moderate to marked canal stenosis. Narrowing of the subarticular recesses. Moderate to marked right and moderate left foraminal stenosis. L4-L5: Disc bulge with endplate osteophytic ridging eccentric to the left. Moderate facet arthropathy with ligamentum infolding. No canal or right foraminal stenosis. Slightly increased moderate to marked left foraminal stenosis. L5-S1: Disc bulge with endplate osteophytic ridging. Mild facet arthropathy. No canal or foraminal stenosis. IMPRESSION: Multifocal abnormal marrow signal reflecting known myeloma. There are new areas of involvement compared to the 2021 study. No new compression fracture. No significant epidural disease. Multilevel degenerative changes as detailed above are similar to the prior study. Canal stenosis remains greatest at L3-L4. Foraminal stenosis remains greatest at L3-L4 and L4-L5. Distended bladder with numerous diverticula. Electronically Signed   By: PMacy MisM.D.   On: 08/01/2021 11:29    PERFORMANCE STATUS (ECOG) : 2 - Symptomatic, <50% confined to bed  Review of Systems Unless otherwise noted, a complete review of systems is negative.  Physical Exam General: NAD Pulmonary: Unlabored Abdomen: soft, nontender, + bowel sounds GU: no suprapubic tenderness Extremities: no edema, no joint deformities Skin: no rashes Neurological: Weakness but otherwise nonfocal  IMPRESSION: I met with patient following his visit with Dr. FGrayland [name]  Patient is pending treatment today with daratumumab.  Patient has had significant persistent pain in bilateral hips and back secondary to his myeloma.  Recent imaging including MRI to the lumbar spine and right hip did not show fractures but patient does have multiple enhancing bony lesions.  Patient has been referred for XRT.  Patient is taking oxycodone 10 mg as needed.  He does find this helps but says that he is only taking it on  average once a day due to fear that he will get "hooked."  He does reported effective but short-lived, only lasting 3 to 4 hours.  He also reports some constipating effects.  Discussed the option of starting him on a long-acting opioid and patient verbalized agreement.  We will trial low-dose fentanyl patch.  Patient can continue taking oxycodone as needed for breakthrough pain.  Discussed that he will likely  require daily bowel regimen to prevent opioid-induced constipation.  Patient ambulates with use of a walker.  He does report frequent falls in the past (last about 2 weeks ago).  We will see if Dean [name] can evaluate.  PLAN: -Continue current scope of treatment -Start transdermal fentanyl 12.5 mcg every 72 hours -Continue oxycodone IR as needed for breakthrough pain -Naloxone spray. -PDMP reviewed -Daily bowel regimen -Referral for rehab screening -Referral for community palliative care -He will benefit from MOST form -RTC 1 month    Patient expressed understanding and was in agreement with this plan. He also understands that He can call the clinic at any time with any questions, concerns, or complaints.     Time Total: 20 minutes  Visit consisted of counseling and education dealing with the complex and emotionally intense issues of symptom management and palliative care in the setting of serious and potentially life-threatening illness.Greater than 50%  of this time was spent counseling and coordinating care related to the above assessment and plan.  Signed by: Altha Harm, PhD, NP-C

## 2021-08-07 NOTE — Telephone Encounter (Signed)
Attempted to contact listed number for patient's wife Dean [name], to offer to schedule a Palliative Consult and it went striaght to VM and unable to leave message due to no VM set up.    I then called listed home # in Epic for patient and spoke with him about the Palliative referral and he was in agreement with beginning services with [place].  He requested that I contact his daughter Dean [name] to schedule this because he would like her to be there for the initial visit.    Called patient's dtr Dean [name] and left a VM as to the reason for my call and left my name and call back number requesting a return call.

## 2021-08-08 ENCOUNTER — Other Ambulatory Visit: Payer: Medicare Other | Admitting: Nurse Practitioner

## 2021-08-08 ENCOUNTER — Telehealth: Payer: Self-pay | Admitting: Nurse Practitioner

## 2021-08-08 ENCOUNTER — Encounter: Payer: Self-pay | Admitting: Nurse Practitioner

## 2021-08-08 ENCOUNTER — Other Ambulatory Visit: Payer: Self-pay | Admitting: Family Medicine

## 2021-08-08 ENCOUNTER — Other Ambulatory Visit: Payer: Self-pay | Admitting: Oncology

## 2021-08-08 ENCOUNTER — Telehealth: Payer: Self-pay

## 2021-08-08 ENCOUNTER — Encounter (HOSPITAL_COMMUNITY): Payer: Self-pay | Admitting: Hematology

## 2021-08-08 DIAGNOSIS — Z515 Encounter for palliative care: Secondary | ICD-10-CM

## 2021-08-08 DIAGNOSIS — R5381 Other malaise: Secondary | ICD-10-CM | POA: Diagnosis not present

## 2021-08-08 DIAGNOSIS — R634 Abnormal weight loss: Secondary | ICD-10-CM

## 2021-08-08 DIAGNOSIS — C9000 Multiple myeloma not having achieved remission: Secondary | ICD-10-CM

## 2021-08-08 LAB — KAPPA/LAMBDA LIGHT CHAINS
Kappa free light chain: 155.3 mg/L — ABNORMAL HIGH (ref 3.3–19.4)
Kappa, lambda light chain ratio: 77.65 — ABNORMAL HIGH (ref 0.26–1.65)
Lambda free light chains: 2 mg/L — ABNORMAL LOW (ref 5.7–26.3)

## 2021-08-08 LAB — IGG, IGA, IGM
IgA: 8 mg/dL — ABNORMAL LOW (ref 61–437)
IgG (Immunoglobin G), Serum: 5343 mg/dL — ABNORMAL HIGH (ref 603–1613)
IgM (Immunoglobulin M), Srm: 15 mg/dL (ref 15–143)

## 2021-08-08 MED ORDER — ALPRAZOLAM 1 MG PO TABS: 1.0000 mg | ORAL_TABLET | Freq: Three times a day (TID) | ORAL | 0 refills | Status: DC | PRN

## 2021-08-08 NOTE — Telephone Encounter (Signed)
Ret'd call to patient's daughter, Gillermina Phy, and we discussed the Palliative referral/services and all questions were answered.  I have scheduled an [place] for 08/08/21 @ 1:30 PM

## 2021-08-08 NOTE — Addendum Note (Signed)
Addended by: Altha Harm R on: 08/08/2021 01:14 PM   Modules accepted: Orders

## 2021-08-08 NOTE — Telephone Encounter (Signed)
I spoke with patient's daughter.  We discussed the rationale of starting patient on a long-acting opioid, given his report that the efficacy of the oxycodone is extremely short-lived.  She verbalized concern with patient being on any long-acting even after discussing alternatives.  She would prefer to continue the oxycodone IR and ibuprofen for now.  We will DC the transdermal fentanyl.

## 2021-08-08 NOTE — Telephone Encounter (Signed)
Patient daughter [name] called and stated that her father was seen in office yesterday 7/5 and was prescribed a fentanyl patch and narcan spray and is very concerned about this counter reacting with his other medication. She is concerned because she states patient is already on gabapention '400mg'$  and Oxycodone '10mg'$ . She states he is home alone and wouldn't be able to administer the narcan spray if possible overdose. She would like someone to explain to her the "scenario for this" because she fill like this is to much. Callback number [phone]

## 2021-08-08 NOTE — Progress Notes (Signed)
[facility] Consult Note Telephone: [phone]  Fax: [phone]   Date of encounter: 08/08/21 3:00 PM PATIENT NAME: Dean [name]. [place] [address]   [phone] (home)  DOB: January 14, 1936 MRN: [id number] PRIMARY CARE PROVIDER:    [name], MD,  [address] [place] [address] [phone]  REFERRING PROVIDER:   [name], MD [address] [place],  [place] [address] [phone]  RESPONSIBLE PARTY:    Contact Information     Name Relation Home Work [place] Daughter   [phone]   [facility] Spouse [phone]  [phone]   Carneal,Dana Daughter  [phone]      Due to the COVID-19 crisis, this visit was done via telemedicine from my office and it was initiated and consent by this patient and or family.  I connected with Dean [name] daughter with  [name]. OR PROXY on 08/08/21 by telephone as video not available enabled telemedicine application and verified that I am speaking with the correct person using two identifiers.   I discussed the limitations of evaluation and management by telemedicine. The patient expressed understanding and agreed to proceed.  Palliative Care was asked to follow this patient by consultation request of  [name], MD to address advance care planning and complex medical decision making. This is the initial visit.                            ASSESSMENT AND PLAN / RECOMMENDATIONS:  Symptom Management/Plan: 1. Advance Care Planning;  Will revisit at next [facility] visit.   2. Goals of Care: Goals include to maximize quality of life and symptom management. Our advance care planning conversation included a discussion about:    The value and importance of advance care planning  Exploration of personal, cultural or spiritual beliefs that might influence medical decisions  Exploration of goals of  care in the event of a sudden injury or illness  Identification and preparation of a healthcare agent  Review and updating or creation of an advance directive document  3. Debility secondary to multiple myeloma, discussed fall risk, ambulates with walker. May benefit from PT if increase in weakness with ongoing treatments.   4. Weight loss secondary to multiple myeloma. Discussed appetite, nutrition, weights, supplements. Continue to monitor weights, encourage Mr. Jalbert eat 3 meals daily with snacks which has been hard for him.  10/13/2019 weight 192 lbs 09/16/2020 weight 184 lbs 03/12/2021 weight 183 lbs 08/07/2021 weight 168 lbs BMI 25.54 24 lbs/24 months; 12.50%  5. Palliative care encounter; Palliative care encounter; Palliative medicine team will continue to support patient, patient's family, and medical team. Visit consisted of counseling and education dealing with the complex and emotionally intense issues of symptom management and palliative care in the setting of serious and potentially life-threatening illness  Follow up Palliative Care Visit: Palliative care will continue to follow for complex medical decision making, advance care planning, and clarification of goals. Return 4 weeks or prn.  I spent 32 minutes providing this consultation. More than 50% of the time in this consultation was spent in counseling and care coordination. PPS: 50% Chief Complaint: Initial palliative consult for complex medical decision making, address goals, manage ongoing symptoms  HISTORY OF PRESENT ILLNESS:  Dean [name]. is a 86 y.o. year old male  with multiple medical  problems including multiple myeloma h/o chemo/XRT, BPH, GERD, h/o colonic diverticulitis with diverticular abscess, h/o ETOH abuse. I called Dean [name], Mr. Dao's daughter for telephonic telemedicine initial [facility] consult as video not available. I talked with Dean [name] reviewed past medical history, social/family history. We talked about dx  multiple myeloma with ongoing treatment received chemo with XRT. We talked about ros, side effects. We talked about pain including recently starting Fentanyl patches. We talked about Barbara's concerns as with Mr Bastin lives with his disabled wife who would be unable to administer narcan if needed. Dean [name] talked about his conversation with Billey Chang NP she had today concerning pain management. We talked about sleep patterns, sleep hygiene, continue trazodone. We talked about appetite, weight loss. Dean [name] endorses she brings food to them in addition to Mr. Landini being able to fix some simple meals. Dean [name] endorses Mr. Heavin continues to drive short distances including to breakfast every morning. We talked about overall quality of life, debility with dx multiple myeloma. Mr Barajas joined discussion. I talked with Mr Torelli at length about his functional abilities, getting himself dressed, bathed. We talked about debility, functional decline, ros including pain. We talked about sleep, appetite, nutrition. We talked about role pc in poc. Mr. Quinto was in agreement to having in-person f/u pc visit, scheduled. Discussed medical goals with ongoing infusions and upcoming XRT. We talked about concerns, questions answered. Therapeutic listening, emotional support provided. Contact information provided.   History obtained from review of EMR, discussion with Daughter, Dean [name] and Mr. Koslosky by telephone I reviewed available labs, medications, imaging, studies and related documents from the EMR.  Records reviewed and summarized above.   ROS 10 point system reviewed all negative except HPI  Physical Exam: deferred CURRENT PROBLEM LIST:  Patient Active Problem List   Diagnosis Date Noted   Spinal stenosis, lumbar 10/13/2016   Chronic back pain 10/01/2016   DDD (degenerative disc disease), lumbar 10/01/2016   OA (osteoarthritis) of knee 10/01/2016   Diverticulitis s/p robotic sigmoid colectomy 07/11/2015  07/11/2015   Goals of care, counseling/discussion    Abnormal CT scan, sigmoid colon 04/02/2015   Abnormal PET scan of colon 04/02/2015   Thrombocytopenia ([place]) 01/09/2015   Multiple myeloma without remission ([place]) 02/15/2014   Allergic rhinitis 01/16/2014   History of vertebral stress fracture 01/16/2014   Dermatitis 01/16/2014   Loss of weight 09/05/2013   Insomnia 09/05/2013   Fatigue 05/16/2013   Neck pain 12/07/2012   Skull lesion 11/29/2012   Neuropathy ([place])    BPH (benign prostatic hyperplasia)    GERD (gastroesophageal reflux disease)    H/O ETOH abuse    PAST MEDICAL HISTORY:  Active Ambulatory Problems    Diagnosis Date Noted   Neuropathy ([place])    BPH (benign prostatic hyperplasia)    GERD (gastroesophageal reflux disease)    H/O ETOH abuse    Skull lesion 11/29/2012   Neck pain 12/07/2012   Fatigue 05/16/2013   Loss of weight 09/05/2013   Insomnia 09/05/2013   Allergic rhinitis 01/16/2014   History of vertebral stress fracture 01/16/2014   Dermatitis 01/16/2014   Multiple myeloma without remission ([place]) 02/15/2014   Thrombocytopenia ([place]) 01/09/2015   Abnormal CT scan, sigmoid colon 04/02/2015   Abnormal PET scan of colon 04/02/2015   Goals of care, counseling/discussion    Diverticulitis s/p robotic sigmoid colectomy 07/11/2015 07/11/2015   Chronic back pain 10/01/2016   DDD (degenerative disc disease), lumbar 10/01/2016   OA (osteoarthritis) of knee 10/01/2016   Spinal stenosis,  lumbar 10/13/2016   Resolved Ambulatory Problems    Diagnosis Date Noted   TIA (transient ischemic attack) 11/29/2012   Thrush 12/07/2012   Colonic diverticular abscess 01/08/2015   Intra-abdominal abscess ([place])    Diverticulitis of colon 07/11/2015   Past Medical History:  Diagnosis Date   Diverticulitis 12/22/14   History of chemotherapy last done jan 2017   History of radiation therapy 01/05/13-02/10/13   History of radiation therapy 12/01/16-12/10/16   History of  radiation therapy 02/19/17-02/22/17   Multiple myeloma ([place]) 2015   Radiation 02/21/14-03/08/14   Radiation 04/19/14-05/02/14   Wrist fracture, left    SOCIAL HX:  Social History   Tobacco Use   Smoking status: Never   Smokeless tobacco: Never  Substance Use Topics   Alcohol use: No    Comment: " Not much no more"   FAMILY HX:  Family History  Problem Relation Age of Onset   Diabetes Mother    Stroke Mother    Hypertension Father       ALLERGIES:  Allergies  Allergen Reactions   Codeine Other (See Comments)    Headache   Morphine And Related Other (See Comments)    Makes him feel weird   Revlimid [Lenalidomide] Other (See Comments)    "Causes me to become weak"     PERTINENT MEDICATIONS:  Outpatient Encounter Medications as of 08/08/2021  Medication Sig   acyclovir (ZOVIRAX) 400 MG tablet TAKE 1 TABLET BY MOUTH TWICE A DAY   ALPRAZolam (XANAX) 1 MG tablet TAKE 1 TABLET BY MOUTH 3 TIMES DAILY AS NEEDED FOR ANXIETY   CALCIUM-VITAMIN D PO Take 1 tablet by mouth 2 (two) times daily.    cephALEXin (KEFLEX) 500 MG capsule Take 1 capsule (500 mg total) by mouth 3 (three) times daily. (Patient not taking: Reported on 04/25/2021)   cetirizine (ZYRTEC) 10 MG tablet Take 1 tablet (10 mg total) by mouth daily.   dexamethasone (DECADRON) 4 MG tablet  (Patient not taking: Reported on 06/27/2021)   fluticasone (FLONASE) 50 MCG/ACT nasal spray Place 2 sprays into both nostrils daily.   furosemide (LASIX) 20 MG tablet TAKE 1 TABLET BY MOUTH ONCE A DAY AS NEEDED   gabapentin (NEURONTIN) 400 MG capsule TAKE 1 CAPSULE BY MOUTH 3 TIMES A DAY   Homeopathic Products ([place]) Apply topically as needed.   ibuprofen (ADVIL) 600 MG tablet TAKE 1 TABLET BY MOUTH EVERY 8 HOURS AS NEEDED   lidocaine-prilocaine (EMLA) cream Apply small amount over port one (1) hour prior to appointment. (Patient not taking: Reported on 06/27/2021)   montelukast (SINGULAIR) 10 MG tablet TAKE 1 TABLET BY MOUTH  EVERY NIGHT AT BEDTIME   naloxone (NARCAN) nasal spray 4 mg/0.1 mL SPRAY 1 SPRAY INTO ONE NOSTRIL AS DIRECTED FOR OPIOID OVERDOSE (TURN PERSON ON SIDE AFTER DOSE. IF NO RESPONSE IN 2-3 MINUTES OR PERSON RESPONDS BUT RELAPSES, REPEAT USING A NEW SPRAY DEVICE AND SPRAY INTO THE OTHER NOSTRIL. CALL 911 AFTER USE.) * EMERGENCY USE ONLY *   omeprazole (PRILOSEC) 20 MG capsule TAKE 1 CAPSULE BY MOUTH ONCE DAILY   oxybutynin (DITROPAN-XL) 10 MG 24 hr tablet TAKE 1 TABLET BY MOUTH EVERY NIGHT AT BEDTIME (Patient not taking: Reported on 08/07/2021)   Oxycodone HCl 10 MG TABS TAKE 1 TO 2 TABLETS BY MOUTH 3 TIMES DAILY AS NEEDED   predniSONE (DELTASONE) 20 MG tablet Take 2 tablets (40 mg total) by mouth daily with breakfast. (Patient not taking: Reported on 06/27/2021)   senna-docusate (  SENOKOT-S) 8.6-50 MG tablet Take 1 tablet by mouth 2 (two) times daily.   silver sulfADIAZINE (SILVADENE) 1 % cream Apply 1 application topically daily. (Patient not taking: Reported on 08/07/2021)   tamsulosin (FLOMAX) 0.4 MG CAPS capsule TAKE ONE CAPSULE BY MOUTH EVERY NIGHT ATBEDTIME   traZODone (DESYREL) 100 MG tablet TAKE 3 TABLETS BY MOUTH AT BEDTIME   triamcinolone cream (KENALOG) 0.1 % APPLY 1 APPLICATION TOPICALLY TWICE A DAY (Patient not taking: Reported on 08/07/2021)   TURMERIC PO Take by mouth.   zolendronic acid (ZOMETA) 4 MG/5ML injection Inject 4 mg into the vein every 8 (eight) weeks.   Facility-Administered Encounter Medications as of 08/08/2021  Medication   0.9 %  sodium chloride infusion   heparin lock flush 100 unit/mL   heparin lock flush 100 unit/mL   Thank you for the opportunity to participate in the care of Mr. Rodriques.  The palliative care team will continue to follow. Please call our office at [phone] if we can be of additional assistance.   Damauri Minion Z Jnae Thomaston, NP ,

## 2021-08-09 ENCOUNTER — Other Ambulatory Visit: Payer: Self-pay | Admitting: Oncology

## 2021-08-09 MED ORDER — IBUPROFEN 600 MG PO TABS: 600.0000 mg | ORAL_TABLET | Freq: Three times a day (TID) | ORAL | 0 refills | Status: DC | PRN

## 2021-08-12 LAB — PROTEIN ELECTROPHORESIS, SERUM
A/G Ratio: 0.6 — ABNORMAL LOW (ref 0.7–1.7)
Albumin ELP: 3.7 g/dL (ref 2.9–4.4)
Alpha-1-Globulin: 0.4 g/dL (ref 0.0–0.4)
Alpha-2-Globulin: 0.7 g/dL (ref 0.4–1.0)
Beta Globulin: 0.8 g/dL (ref 0.7–1.3)
Gamma Globulin: 4.1 g/dL — ABNORMAL HIGH (ref 0.4–1.8)
Globulin, Total: 6 g/dL — ABNORMAL HIGH (ref 2.2–3.9)
M-Spike, %: 4 g/dL — ABNORMAL HIGH
Total Protein ELP: 9.7 g/dL — ABNORMAL HIGH (ref 6.0–8.5)

## 2021-08-13 ENCOUNTER — Ambulatory Visit: Payer: Medicare Other

## 2021-08-13 DIAGNOSIS — Z789 Other specified health status: Secondary | ICD-10-CM

## 2021-08-13 NOTE — Progress Notes (Signed)
Palliative care SW outreached patient to complete telephonic f/u call.    Palliative care SW outreached patient to complete telephonic follow up call to patient's daughter, [name], per [facility] NP - C. Gusler SW referral request to assess patients needs.  Daughter shared that main psychosocial concern for patient currently was his lack of mobility and debility keeping him from making meals and checking his mail box that is across the street from his long driveway. Daughter has Industrial/product designer and is considering obtaining a PO box for patient and wife that she will check weekly. Daughter has also outreached local [employer] some time ago, and was told that patient was out of their delivery zone.   Psychosocial assessment: completed.  Resources/interventions:  SW outreached patients local post office to inquire about curbside or door mail delivery. SW was told that door delivery was not an option due to paitents location but that curbside delievery and the possibility of relocating patients mailbox across the street could be assessed with a medical hardship letter from patients doctor requesting this service/change due to a debilitating medical dx. The letter would need to be mailed to local postmaster or hand delivered to the post office.   SW also LVM for [facility] inquiring of new route option for patients location. SW also made a referral to [employer] for possible meal deliveries.   Daughter expressed no other psychosocial needs. SW will mail patient general cancer support resources to include house cleaning service for cancer patient.     Palliative care will continue to monitor and assist with long term care planning as needed.

## 2021-08-17 DIAGNOSIS — W19XXXA Unspecified fall, initial encounter: Secondary | ICD-10-CM | POA: Diagnosis not present

## 2021-08-17 DIAGNOSIS — I10 Essential (primary) hypertension: Secondary | ICD-10-CM | POA: Diagnosis not present

## 2021-08-18 ENCOUNTER — Emergency Department (HOSPITAL_COMMUNITY)
Admission: EM | Admit: 2021-08-18 | Discharge: 2021-08-18 | Disposition: A | Discharge status: Home / Self Care | Payer: Medicare Other | Attending: Emergency Medicine | Admitting: Emergency Medicine

## 2021-08-18 ENCOUNTER — Encounter (HOSPITAL_COMMUNITY): Payer: Self-pay

## 2021-08-18 ENCOUNTER — Emergency Department (HOSPITAL_COMMUNITY): Payer: Medicare Other

## 2021-08-18 DIAGNOSIS — W010XXA Fall on same level from slipping, tripping and stumbling without subsequent striking against object, initial encounter: Secondary | ICD-10-CM | POA: Insufficient documentation

## 2021-08-18 DIAGNOSIS — Z79899 Other long term (current) drug therapy: Secondary | ICD-10-CM | POA: Insufficient documentation

## 2021-08-18 DIAGNOSIS — S42022A Displaced fracture of shaft of left clavicle, initial encounter for closed fracture: Secondary | ICD-10-CM | POA: Insufficient documentation

## 2021-08-18 DIAGNOSIS — S4992XA Unspecified injury of left shoulder and upper arm, initial encounter: Secondary | ICD-10-CM | POA: Diagnosis present

## 2021-08-18 DIAGNOSIS — C9000 Multiple myeloma not having achieved remission: Secondary | ICD-10-CM

## 2021-08-18 DIAGNOSIS — M1612 Unilateral primary osteoarthritis, left hip: Secondary | ICD-10-CM | POA: Diagnosis not present

## 2021-08-18 DIAGNOSIS — S42032A Displaced fracture of lateral end of left clavicle, initial encounter for closed fracture: Secondary | ICD-10-CM | POA: Diagnosis not present

## 2021-08-18 DIAGNOSIS — S42002A Fracture of unspecified part of left clavicle, initial encounter for closed fracture: Secondary | ICD-10-CM

## 2021-08-18 MED ORDER — OXYCODONE-ACETAMINOPHEN 5-325 MG PO TABS
1.0000 | ORAL_TABLET | Freq: Once | ORAL | Status: AC
  Administered 2021-08-18: 1 via ORAL
  Filled 2021-08-18: qty 1

## 2021-08-18 NOTE — Discharge Instructions (Addendum)
You been provided the contact information for Dr. Alain [name], a local orthopedic specialist.  Please call and schedule an appointment to follow-up with them in the next 2 to 3 days for reevaluation continue medical management.  Please continue to use the shoulder immobilizer and do not do activities on your own.  Continue using your pain medication that you have as prescribed.  Continue to rest, elevate, and ice your injury as well.  Follow-up with your PCP next week for continued holistic management.  Return to the ED for new or worsening symptoms as discussed

## 2021-08-18 NOTE — ED Provider Notes (Signed)
[facility] EMERGENCY DEPARTMENT Provider Note   CSN: [id number] Arrival date & time: 08/18/21  1027     History  Chief Complaint  Patient presents with   Fall   Shoulder Injury   Hip Pain    Dean [name]. is a 86 y.o. male with history of active multiple myeloma on chemotherapy, BPH, GERD, diverticulosis, neuropathy.  Presenting today with left shoulder and left hip pain after a fall 2 days ago.  Got up out of bed, went to reach for his walker, it started to slide away from him, and he fell onto his left side.  Denies hitting head, head pain, neck pain, loss of consciousness, or any other bodily injuries from the fall.  Denies dizziness, lightheadedness, chest pain, shortness of breath, or vision changes before or after the fall.  Family members expressed concern over the increased number of falls over the last few months.  They also confirmed that he is at baseline cognition.  Not on anticoagulation.  The history is provided by the patient, medical records, a relative and the spouse.  Fall  Shoulder Injury  Hip Pain       Home Medications Prior to Admission medications   Medication Sig Start Date End Date Taking? Authorizing Provider  acyclovir (ZOVIRAX) 400 MG tablet TAKE 1 TABLET BY MOUTH TWICE A DAY 07/24/21   Derek Jack, MD  ALPRAZolam Duanne Moron) 1 MG tablet Take 1 tablet (1 mg total) by mouth 3 (three) times daily as needed for anxiety. 08/08/21   Lloyd Huger, MD  CALCIUM-VITAMIN D PO Take 1 tablet by mouth 2 (two) times daily.     [provider]  cephALEXin (KEFLEX) 500 MG capsule Take 1 capsule (500 mg total) by mouth 3 (three) times daily. Patient not taking: Reported on 04/25/2021 03/26/21   [name], MD  cetirizine (ZYRTEC) 10 MG tablet Take 1 tablet (10 mg total) by mouth daily. 07/18/21   Leath-Warren, Alda Lea, NP  dexamethasone (DECADRON) 4 MG tablet  02/21/20   [provider]  fluticasone (FLONASE) 50 MCG/ACT nasal  spray Place 2 sprays into both nostrils daily. 10/31/20   Eulogio Bear, NP  furosemide (LASIX) 20 MG tablet TAKE 1 TABLET BY MOUTH ONCE A DAY AS NEEDED 03/14/20   Derek Jack, MD  gabapentin (NEURONTIN) 400 MG capsule TAKE 1 CAPSULE BY MOUTH 3 TIMES A DAY 03/02/21   Lloyd Huger, MD  Homeopathic Products ([place]) Apply topically as needed.    [provider]  ibuprofen (ADVIL) 600 MG tablet Take 1 tablet (600 mg total) by mouth every 8 (eight) hours as needed. 08/09/21   [name], MD  lidocaine-prilocaine (EMLA) cream Apply small amount over port one (1) hour prior to appointment. Patient not taking: Reported on 06/27/2021 09/17/19   Derek Jack, MD  montelukast (SINGULAIR) 10 MG tablet TAKE 1 TABLET BY MOUTH EVERY NIGHT AT BEDTIME 04/29/21   [name], MD  naloxone [facility]) nasal spray 4 mg/0.1 mL SPRAY 1 SPRAY INTO ONE NOSTRIL AS DIRECTED FOR OPIOID OVERDOSE (TURN PERSON ON SIDE AFTER DOSE. IF NO RESPONSE IN 2-3 MINUTES OR PERSON RESPONDS BUT RELAPSES, REPEAT USING A NEW SPRAY DEVICE AND SPRAY INTO THE OTHER NOSTRIL. CALL 911 AFTER USE.) * EMERGENCY USE ONLY * 08/07/21   Borders, Kirt Boys, NP  omeprazole (PRILOSEC) 20 MG capsule TAKE 1 CAPSULE BY MOUTH ONCE DAILY 11/25/18   Lockamy, Randi L, NP-C  oxybutynin (DITROPAN-XL) 10 MG  24 hr tablet TAKE 1 TABLET BY MOUTH EVERY NIGHT AT BEDTIME Patient not taking: Reported on 08/07/2021 06/20/21   [name], MD  Oxycodone HCl 10 MG TABS TAKE 1 TO 2 TABLETS BY MOUTH 3 TIMES DAILY AS NEEDED 07/31/21   Lloyd Huger, MD  predniSONE (DELTASONE) 20 MG tablet Take 2 tablets (40 mg total) by mouth daily with breakfast. Patient not taking: Reported on 06/27/2021 03/05/21   [name], MD  senna-docusate (SENOKOT-S) 8.6-50 MG tablet Take 1 tablet by mouth 2 (two) times daily. 11/30/18   Roger Shelter, FNP  silver sulfADIAZINE (SILVADENE) 1 % cream Apply 1 application topically daily. Patient  not taking: Reported on 08/07/2021 06/14/20   [name], MD  tamsulosin (FLOMAX) 0.4 MG CAPS capsule TAKE ONE CAPSULE BY MOUTH EVERY NIGHT ATBEDTIME 01/07/21   [name], MD  traZODone (DESYREL) 100 MG tablet TAKE 3 TABLETS BY MOUTH AT BEDTIME 07/02/21   Lloyd Huger, MD  triamcinolone cream (KENALOG) 0.1 % APPLY 1 APPLICATION TOPICALLY TWICE A DAY Patient not taking: Reported on 08/07/2021 08/31/20   [name], MD  TURMERIC PO Take by mouth.    [provider]  zolendronic acid (ZOMETA) 4 MG/5ML injection Inject 4 mg into the vein every 8 (eight) weeks.    [provider]      Allergies    Codeine, Morphine and related, and Revlimid [lenalidomide]    Review of Systems   Review of Systems  Musculoskeletal:        Left shoulder pain, left hip pain    Physical Exam Updated Vital Signs BP (!) 119/56   Pulse 60   Temp 98.2 F (36.8 C)   Resp 15   SpO2 98%  Physical Exam Vitals and nursing note reviewed.  Constitutional:      General: He is not in acute distress.    Appearance: He is well-developed. He is not ill-appearing, toxic-appearing or diaphoretic.  HENT:     Head: Normocephalic and atraumatic.     Comments: Head appears atraumatic without evidence of hematoma, swelling, tenderness, laceration, battle sign, [employer].    Nose: Nose normal.     Mouth/Throat:     Mouth: Mucous membranes are moist.     Pharynx: Oropharynx is clear.  Eyes:     General: Lids are normal. Vision grossly intact. Gaze aligned appropriately.     Conjunctiva/sclera: Conjunctivae normal.  Neck:     Comments: Very supple on exam, without midline or paraspinal tenderness Cardiovascular:     Rate and Rhythm: Normal rate and regular rhythm.     Pulses: Normal pulses.     Heart sounds: No murmur heard.    Comments: Radial, DP, PT pulses 2+ bilaterally. Pulmonary:     Effort: Pulmonary effort is normal. No respiratory distress.     Breath  sounds: Normal breath sounds. No wheezing.  Chest:     Chest wall: No tenderness.  Abdominal:     Palpations: Abdomen is soft.     Tenderness: There is no abdominal tenderness. There is no guarding.  Musculoskeletal:        General: No swelling.       Arms:     Cervical back: Normal and neck supple. No deformity, rigidity, tenderness, bony tenderness or crepitus. Normal range of motion.     Thoracic back: Normal.     Lumbar back: Normal.     Right hip: Tenderness present.  Left hip: Tenderness present.     Right lower leg: No edema.     Left lower leg: No edema.       Legs:     Comments:  Left shoulder with pinpoint tenderness over the distal aspect of the clavicle with mild bony deformity.  Without AC joint, glenohumeral joint, or posterior shoulder tenderness.  Without humeral tenderness or deformity.  Deferred ROM exam due to possibility of fracture.  No obvious dislocation appreciated.  Upper extremities appear neurovascularly intact. Left hip bony tenderness without bony deformity or crepitus.  Without tenderness of the left hip bursa.  No obvious leg shortening or malrotation.  Deferred ROM exam due to possibility of fracture.  Lower extremities appear neurovascularly intact. Mild right-sided hip tenderness, subjectively chronic per patient.  Skin:    General: Skin is warm and dry.     Capillary Refill: Capillary refill takes less than 2 seconds.     Coloration: Skin is not jaundiced.     Findings: No bruising.  Neurological:     General: No focal deficit present.     Mental Status: He is alert and oriented to person, place, and time. Mental status is at baseline.     GCS: GCS eye subscore is 4. GCS verbal subscore is 5. GCS motor subscore is 6.     Cranial Nerves: No dysarthria or facial asymmetry.     Motor: No tremor or seizure activity.     Coordination: Coordination normal.     Comments: Unable to assess gait due to pre-existing chronic weakness and overall gait  difficulty.  At baseline per family members.  Psychiatric:        Mood and Affect: Mood normal.     ED Results / Procedures / Treatments   Labs (all labs ordered are listed, but only abnormal results are displayed) Labs Reviewed - No data to display  EKG None  Radiology DG Hip Unilat W or Wo Pelvis 2-3 Views Left  Result Date: 08/18/2021 CLINICAL DATA:  Fall.  Pain. EXAM: DG HIP (WITH OR WITHOUT PELVIS) 2-3V LEFT COMPARISON:  MRI of the hip August 01, 2021. Hip x-rays May 23, 2020. FINDINGS: The patient has known multiple myeloma. Many of the myeloma thus lesions identified on the August 01, 2021 MRI are not well visualized on this study. However, several lytic lesions are identified in the proximal femurs and pelvic bones. Severe degenerative changes in the right hip with loss of joint space and flattening of the femoral head. Moderate degenerative changes in the left hip without significant loss of joint space. No fractures are identified. No other acute abnormalities. IMPRESSION: 1. Lytic lesions as above consistent with known multiple myeloma. 2. No acute fractures identified. 3. Severe degenerative changes of the right hip and moderate degenerative changes in the left hip. Electronically Signed   By: Dorise Bullion III M.D.   On: 08/18/2021 11:42   DG Shoulder Left  Result Date: 08/18/2021 CLINICAL DATA:  86 year old male with history of trauma from a fall complaining of left shoulder pain. EXAM: LEFT SHOULDER - 2+ VIEW COMPARISON:  None Available. FINDINGS: Three views of the left shoulder demonstrate an acute minimally displaced fracture of the distal third of the left clavicle with approximately 1/2 shaft width of displacement of the distal fracture fragment. Visualized portions of the left proximal humerus and scapula appear intact. Humeral head appears located on the slightly atypical views provided. Multiple old healed posterior left-sided rib fractures are incidentally noted.  IMPRESSION:  1. Acute minimally displaced fracture of the distal third of the left clavicle, as above. Electronically Signed   By: Vinnie Langton M.D.   On: 08/18/2021 11:39    Procedures Procedures    Medications Ordered in ED Medications  oxyCODONE-acetaminophen (PERCOCET/ROXICET) 5-325 MG per tablet 1 tablet (1 tablet Oral Given 08/18/21 1247)    ED Course/ Medical Decision Making/ A&P                           Medical Decision Making Amount and/or Complexity of Data Reviewed Radiology: ordered.  Risk Prescription drug management.   86 y.o. male presents to the ED for concern of Fall, Shoulder Injury, and Hip Pain   This involves an extensive number of treatment options, and is a complaint that carries with it a high risk of complications and morbidity.  The emergent differential diagnosis prior to evaluation includes, but is not limited to: Fracture, dislocation, contusion, MSK strain  This is not an exhaustive differential.   Past Medical History / Co-morbidities / Social History: Hx of active multiple myeloma on chemotherapy, BPH, GERD, diverticulosis, neuropathy Social Determinants of Health include: Elderly  Additional History:  Internal and external records from outside source obtained and reviewed including oncology and palliative care, which indicates active treatments for multiple myeloma, most recent infusion on 08/07/2021.  Lab Tests: None  Imaging Studies: I ordered imaging studies including x-ray left shoulder and left hip.   I independently visualized and interpreted imaging which showed acute minimally displaced fracture of the distal third of the left clavicle, without fracture or dislocation of the left hip I agree with the radiologist interpretation.  ED Course: Pt well-appearing on exam.  Resting comfortably.  Nontoxic, non-ill appearing, in NAD.  AAOx4.  Hemodynamically stable.  At baseline per family members.  Had a recent fall 2 days ago without head  injury, head pain, or neck pain.  Without LOC.  Only complaining of left shoulder and left hip pain, as he fell onto his left side when trying to stabilize himself from the bed to his walker.  The walker started to push away from, causing him to fall.  Appears to be a mechanical fall.  Without symptoms preceding fall.  Without chest pain, shortness of breath, dizziness, lightheadedness, vision changes, or new neurodeficits before or after the fall.  Low suspicion of other contributing factors to the fall at this time. Pt X-Ray with acute distal clavicle fracture as described above.  No evidence of hip fracture or dislocation.  Without pelvic instability on exam.  Pain managed in ED.  Extremities appear neurovascularly intact.  No evidence of ischemia or compartment syndrome.  Pt advised to closely follow up with orthopedics for further evaluation and treatment.  Patient given sling and swath while in ED.  After administration, extremity rechecked and remains neurovascularly intact.  Recommended RICE therapy and conservative symptom management until orthopedic follow up.  Also advised refraining from use of walker or cane due to limited use of the left upper arm.  Remains hemodynamically stable.  Patient has family nearby for assistance and does not live alone.  Has chronic pain medication that he plans to utilize for additional pain management.  Provided additional information regarding fall risks and prevention.  Patient in NAD and in good condition at time of discharge.  Disposition: After consideration the patient's encounter today, I do not feel today's workup suggests an emergent condition requiring admission or immediate intervention beyond  what has been performed at this time.  Safe for discharge; instructed to return immediately for worsening symptoms, change in symptoms or any other concerns.  I have reviewed the patients home medicines and have made adjustments as needed.  Discussed course of treatment  with the patient, whom demonstrated understanding.  Patient in agreement and has no further questions.    I discussed this case with my attending physician Dr. Roderic Palau, who agreed with the proposed treatment course and cosigned this note including patient's presenting symptoms, physical exam, and planned diagnostics and interventions.  Attending physician stated agreement with plan or made changes to plan which were implemented.     This chart was dictated using voice recognition software.  Despite best efforts to proofread, errors can occur which can change the documentation meaning.         Final Clinical Impression(s) / ED Diagnoses Final diagnoses:  Closed displaced fracture of left clavicle, initial encounter  Multiple myeloma without remission [facility])    Rx / DC Orders ED Discharge Orders     None         Candace Cruise 50/38/88 2048    Milton Ferguson, MD 08/25/21 [phone]

## 2021-08-18 NOTE — ED Triage Notes (Signed)
Pt states that two days ago he was using a walker to transfer out of his bed when the walker tipped over causing him to fall. Pt denies head injury, no LOC, no blood thinners. Pt c/o L shoulder and L hip pain.

## 2021-08-19 ENCOUNTER — Telehealth: Payer: Self-pay

## 2021-08-19 ENCOUNTER — Other Ambulatory Visit: Payer: Medicare Other | Admitting: Nurse Practitioner

## 2021-08-19 DIAGNOSIS — S42002A Fracture of unspecified part of left clavicle, initial encounter for closed fracture: Secondary | ICD-10-CM | POA: Diagnosis not present

## 2021-08-19 NOTE — Telephone Encounter (Signed)
PC SW outreached patients daughter, [name].  Call unsuccessful. SW LVM. Awaiting return call.

## 2021-08-25 NOTE — ED Provider Notes (Signed)
Dean [name]   CSN: [id number] Arrival date & time: 08/18/21  1027     History  Chief Complaint  Patient presents with   Fall   Shoulder Injury   Hip Pain    Dean [name]. is a 86 y.o. male.  Patient with multiple myeloma.  He had a fall and complains of left hip and left shoulder pain  The history is provided by the patient and medical records. No language interpreter was used.  Fall This is a new problem. The current episode started 6 to 12 hours ago. The problem occurs constantly. The problem has not changed since onset.Pertinent negatives include no chest pain, no abdominal pain and no headaches. The symptoms are aggravated by twisting. Nothing relieves the symptoms. He has tried nothing for the symptoms.  Shoulder Injury Pertinent negatives include no chest pain, no abdominal pain and no headaches.  Hip Pain Pertinent negatives include no chest pain, no abdominal pain and no headaches.       Home Medications Prior to Admission medications   Medication Sig Start Date End Date Taking? Authorizing Provider  acyclovir (ZOVIRAX) 400 MG tablet TAKE 1 TABLET BY MOUTH TWICE A DAY 07/24/21   Derek Jack, MD  ALPRAZolam Duanne Moron) 1 MG tablet Take 1 tablet (1 mg total) by mouth 3 (three) times daily as needed for anxiety. 08/08/21   Lloyd Huger, MD  CALCIUM-VITAMIN D PO Take 1 tablet by mouth 2 (two) times daily.     [provider]  cephALEXin (KEFLEX) 500 MG capsule Take 1 capsule (500 mg total) by mouth 3 (three) times daily. Patient not taking: Reported on 04/25/2021 03/26/21   [name], MD  cetirizine (ZYRTEC) 10 MG tablet Take 1 tablet (10 mg total) by mouth daily. 07/18/21   Leath-Warren, Alda Lea, NP  dexamethasone (DECADRON) 4 MG tablet  02/21/20   [provider]  fluticasone (FLONASE) 50 MCG/ACT nasal spray Place 2 sprays into both nostrils daily. 10/31/20   Eulogio Bear, NP  furosemide  (LASIX) 20 MG tablet TAKE 1 TABLET BY MOUTH ONCE A DAY AS NEEDED 03/14/20   Derek Jack, MD  gabapentin (NEURONTIN) 400 MG capsule TAKE 1 CAPSULE BY MOUTH 3 TIMES A DAY 03/02/21   Lloyd Huger, MD  Homeopathic Products ([place]) Apply topically as needed.    [provider]  ibuprofen (ADVIL) 600 MG tablet Take 1 tablet (600 mg total) by mouth every 8 (eight) hours as needed. 08/09/21   [name], MD  lidocaine-prilocaine (EMLA) cream Apply small amount over port one (1) hour prior to appointment. Patient not taking: Reported on 06/27/2021 09/17/19   Derek Jack, MD  montelukast (SINGULAIR) 10 MG tablet TAKE 1 TABLET BY MOUTH EVERY NIGHT AT BEDTIME 04/29/21   [name], MD  naloxone [facility]) nasal spray 4 mg/0.1 mL SPRAY 1 SPRAY INTO ONE NOSTRIL AS DIRECTED FOR OPIOID OVERDOSE (TURN PERSON ON SIDE AFTER DOSE. IF NO RESPONSE IN 2-3 MINUTES OR PERSON RESPONDS BUT RELAPSES, REPEAT USING A NEW SPRAY DEVICE AND SPRAY INTO THE OTHER NOSTRIL. CALL 911 AFTER USE.) * EMERGENCY USE ONLY * 08/07/21   Borders, Kirt Boys, NP  omeprazole (PRILOSEC) 20 MG capsule TAKE 1 CAPSULE BY MOUTH ONCE DAILY 11/25/18   Lockamy, Randi L, NP-C  oxybutynin (DITROPAN-XL) 10 MG 24 hr tablet TAKE 1 TABLET BY MOUTH EVERY NIGHT AT BEDTIME Patient not taking: Reported on 08/07/2021 06/20/21   [name]  T, MD  Oxycodone HCl 10 MG TABS TAKE 1 TO 2 TABLETS BY MOUTH 3 TIMES DAILY AS NEEDED 07/31/21   Lloyd Huger, MD  predniSONE (DELTASONE) 20 MG tablet Take 2 tablets (40 mg total) by mouth daily with breakfast. Patient not taking: Reported on 06/27/2021 03/05/21   [name], MD  senna-docusate (SENOKOT-S) 8.6-50 MG tablet Take 1 tablet by mouth 2 (two) times daily. 11/30/18   Roger Shelter, FNP  silver sulfADIAZINE (SILVADENE) 1 % cream Apply 1 application topically daily. Patient not taking: Reported on 08/07/2021 06/14/20   [name], MD  tamsulosin (FLOMAX) 0.4 MG  CAPS capsule TAKE ONE CAPSULE BY MOUTH EVERY NIGHT ATBEDTIME 01/07/21   [name], MD  traZODone (DESYREL) 100 MG tablet TAKE 3 TABLETS BY MOUTH AT BEDTIME 07/02/21   Lloyd Huger, MD  triamcinolone cream (KENALOG) 0.1 % APPLY 1 APPLICATION TOPICALLY TWICE A DAY Patient not taking: Reported on 08/07/2021 08/31/20   [name], MD  TURMERIC PO Take by mouth.    [provider]  zolendronic acid (ZOMETA) 4 MG/5ML injection Inject 4 mg into the vein every 8 (eight) weeks.    [provider]      Allergies    Codeine, Morphine and related, and Revlimid [lenalidomide]    Review of Systems   Review of Systems  Constitutional:  Negative for appetite change and fatigue.  HENT:  Negative for congestion, ear discharge and sinus pressure.   Eyes:  Negative for discharge.  Respiratory:  Negative for cough.   Cardiovascular:  Negative for chest pain.  Gastrointestinal:  Negative for abdominal pain and diarrhea.  Genitourinary:  Negative for frequency and hematuria.  Musculoskeletal:  Negative for back pain.       Left hip and left shoulder pain  Skin:  Negative for rash.  Neurological:  Negative for seizures and headaches.  Psychiatric/Behavioral:  Negative for hallucinations.     Physical Exam Updated Vital Signs BP (!) 119/56   Pulse 60   Temp 98.2 F (36.8 C)   Resp 15   SpO2 98%  Physical Exam Vitals and nursing [name] reviewed.  Constitutional:      Appearance: He is well-developed.  HENT:     Head: Normocephalic.  Eyes:     General: No scleral icterus.    Conjunctiva/sclera: Conjunctivae normal.  Neck:     Thyroid: No thyromegaly.  Cardiovascular:     Rate and Rhythm: Normal rate and regular rhythm.     Heart sounds: No murmur heard.    No friction rub. No gallop.  Pulmonary:     Breath sounds: No stridor. No wheezing or rales.  Chest:     Chest wall: No tenderness.  Abdominal:     General: There is no distension.     Tenderness: There  is no abdominal tenderness. There is no rebound.  Musculoskeletal:     Cervical back: Neck supple.     Comments: Tenderness to left hip and left shoulder  Lymphadenopathy:     Cervical: No cervical adenopathy.  Skin:    Findings: No erythema or rash.  Neurological:     Mental Status: He is alert and oriented to person, place, and time.     Motor: No abnormal muscle tone.     Coordination: Coordination normal.  Psychiatric:        Behavior: Behavior normal.     ED Results / Procedures / Treatments   Labs (all labs ordered  are listed, but only abnormal results are displayed) Labs Reviewed - No data to display  EKG None  Radiology No results found.  Procedures Procedures    Medications Ordered in ED Medications  oxyCODONE-acetaminophen (PERCOCET/ROXICET) 5-325 MG per tablet 1 tablet (1 tablet Oral Given 08/18/21 1247)    ED Course/ Medical Decision Making/ A&P                           Medical Decision Making Amount and/or Complexity of Data Reviewed Radiology: ordered.  Risk Prescription drug management.  This patient presents to the ED for concern of fall, this involves an extensive number of treatment options, and is a complaint that carries with it a high risk of complications and morbidity.  The differential diagnosis includes left clavicle and left hip fracture   Co morbidities that complicate the patient evaluation  Multiple myeloma   Additional history obtained:  Additional history obtained from patient External records from outside source obtained and reviewed including hospital records   Lab Tests:  No labs ordered  Imaging Studies ordered:  I ordered imaging studies including left hip and left shoulder I independently visualized and interpreted imaging which showed fractured clavicle and lytic lesions in left hip I agree with the radiologist interpretation   Cardiac Monitoring: / EKG:  The patient was maintained on a cardiac monitor.  I  personally viewed and interpreted the cardiac monitored which showed an underlying rhythm of: Normal sinus rhythm   Consultations Obtained:  No consultant  Problem List / ED Course / Critical interventions / Medication management  Multiple myeloma fractured clavicle I ordered medication including Percocet for pain Reevaluation of the patient after these medicines showed that the patient improved I have reviewed the patients home medicines and have made adjustments as needed   Social Determinants of Health:  None   Test / Admission - Considered:  None  Patient with fractured left clavicle and lytic lesions in his left hip  Multiple myeloma.  He will be given pain medicine and follow-up in the outpatient        Final Clinical Impression(s) / ED Diagnoses Final diagnoses:  Closed displaced fracture of left clavicle, initial encounter  Multiple myeloma without remission [facility])    Rx / DC Orders ED Discharge Orders     None         Milton Ferguson, MD 08/25/21 [phone]

## 2021-08-27 ENCOUNTER — Telehealth: Payer: Self-pay

## 2021-08-27 ENCOUNTER — Other Ambulatory Visit: Payer: Self-pay | Admitting: Family Medicine

## 2021-08-27 NOTE — Telephone Encounter (Signed)
Pt called and states he fell and fx his L clavicle on 08/18/2021. Pt states hospital sent in order for a brace but it does not fit. Can we send an order to [facility] to see if they have one that will work for the patient? Thanks.

## 2021-08-31 NOTE — Progress Notes (Unsigned)
[facility]  [phone] [phone]  ID: [name]. OB: 1935/02/15  MR#: [id number]  ESP#:[id number]  Patient Care Team: [name], MD as PCP - General (Family Medicine) Gery Pray, MD as Consulting Physician (Radiation Oncology) Danie Binder, MD (Inactive) as Consulting Physician (Gastroenterology) Michael Boston, MD as Consulting Physician (General Surgery) Lloyd Huger, MD as Consulting Physician (Oncology)  CHIEF COMPLAINT: Multiple myeloma  INTERVAL HISTORY: Patient returns to clinic today for repeat laboratory, further evaluation, and continuation of daratumumab.  He continues to have significant back and right hip pain, now also complaining of left hip pain.  He has no neurologic complaints.  He denies any recent fevers or illnesses.  He has a good appetite and denies weight loss.  He has no chest pain, shortness of breath, cough, or hemoptysis.  He denies any nausea, vomiting, constipation, or diarrhea.  He has no urinary complaints.  Patient offers no further specific complaints today.  REVIEW OF SYSTEMS:   Review of Systems  Constitutional: Negative.  Negative for fever, malaise/fatigue and weight loss.  Respiratory: Negative.  Negative for cough, hemoptysis and shortness of breath.   Cardiovascular: Negative.  Negative for chest pain and leg swelling.  Gastrointestinal: Negative.  Negative for abdominal pain and nausea.  Genitourinary: Negative.  Negative for dysuria.  Musculoskeletal:  Positive for back pain, falls and joint pain.  Skin: Negative.  Negative for rash.  Neurological:  Positive for sensory change. Negative for dizziness, focal weakness, weakness and headaches.  Psychiatric/Behavioral: Negative.  The patient is not nervous/anxious and does not have insomnia.     As per HPI. Otherwise, a complete review of systems is negative.  PAST MEDICAL HISTORY: Past Medical History:  Diagnosis Date   BPH  (benign prostatic hyperplasia)    Colonic diverticular abscess 01/08/2015   Diverticulitis 63/33/54   complicated by abscess and required percutaneous drainage   GERD (gastroesophageal reflux disease)    H/O ETOH abuse    History of chemotherapy last done jan 2017   History of radiation therapy 01/05/13-02/10/13   45 gray to left occipital condyle region   History of radiation therapy 12/01/16-12/10/16   Parasternal nodule, chest- 24 Gy total delivered in 8 fractions, Left sacro-iliac, pelvis- 24 Gy total delivered in 8 fractions    History of radiation therapy 02/19/17-02/22/17   right temporal scalp 30 Gy in 10 fractions   Intra-abdominal abscess (HCC)    Multiple myeloma ([place]) 2015   Neuropathy    Radiation 02/21/14-03/08/14   right posterior chest wall area 30 gray   Radiation 04/19/14-05/02/14   lumbar spine 25 gray   Skull lesion    Left occipital condyle   Wrist fracture, left    x 2    PAST SURGICAL HISTORY: Past Surgical History:  Procedure Laterality Date   COLONOSCOPY N/A 04/19/2015   Procedure: COLONOSCOPY;  Surgeon: Danie Binder, MD;  Location: [facility];  Service: Endoscopy;  Laterality: N/A;  1:30 PM   CYST EXCISION  1959   tail bone   IR IMAGING GUIDED PORT INSERTION  09/25/2017    FAMILY HISTORY: Family History  Problem Relation Age of Onset   Diabetes Mother    Stroke Mother    Hypertension Father     ADVANCED DIRECTIVES (Y/N):  N  HEALTH MAINTENANCE: Social History   Tobacco Use   Smoking status: Never   Smokeless tobacco: Never  Vaping Use   Vaping Use: Never used  Substance Use  Topics   Alcohol use: No    Comment: " Not much no more"   Drug use: No     Colonoscopy:  PAP:  Bone density:  Lipid panel:  Allergies  Allergen Reactions   Codeine Other (See Comments)    Headache   Morphine And Related Other (See Comments)    Makes him feel weird   Revlimid [Lenalidomide] Other (See Comments)    "Causes me to become weak"    Current  Outpatient Medications  Medication Sig Dispense Refill   acyclovir (ZOVIRAX) 400 MG tablet TAKE 1 TABLET BY MOUTH TWICE A DAY 60 tablet 6   ALPRAZolam (XANAX) 1 MG tablet Take 1 tablet (1 mg total) by mouth 3 (three) times daily as needed for anxiety. 90 tablet 0   CALCIUM-VITAMIN D PO Take 1 tablet by mouth 2 (two) times daily.      cephALEXin (KEFLEX) 500 MG capsule Take 1 capsule (500 mg total) by mouth 3 (three) times daily. (Patient not taking: Reported on 04/25/2021) 42 capsule 0   cetirizine (ZYRTEC) 10 MG tablet Take 1 tablet (10 mg total) by mouth daily. 30 tablet 0   dexamethasone (DECADRON) 4 MG tablet  (Patient not taking: Reported on 06/27/2021)     fluticasone (FLONASE) 50 MCG/ACT nasal spray Place 2 sprays into both nostrils daily. 16 g 6   furosemide (LASIX) 20 MG tablet TAKE 1 TABLET BY MOUTH ONCE A DAY AS NEEDED 30 tablet 3   gabapentin (NEURONTIN) 400 MG capsule TAKE 1 CAPSULE BY MOUTH 3 TIMES A DAY 90 capsule 4   Homeopathic Products (THERAWORX RELIEF EX) Apply topically as needed.     ibuprofen (ADVIL) 600 MG tablet Take 1 tablet (600 mg total) by mouth every 8 (eight) hours as needed. 30 tablet 0   lidocaine-prilocaine (EMLA) cream Apply small amount over port one (1) hour prior to appointment. (Patient not taking: Reported on 06/27/2021) 30 g 0   montelukast (SINGULAIR) 10 MG tablet TAKE 1 TABLET BY MOUTH EVERY NIGHT AT BEDTIME 30 tablet 3   naloxone (NARCAN) nasal spray 4 mg/0.1 mL SPRAY 1 SPRAY INTO ONE NOSTRIL AS DIRECTED FOR OPIOID OVERDOSE (TURN PERSON ON SIDE AFTER DOSE. IF NO RESPONSE IN 2-3 MINUTES OR PERSON RESPONDS BUT RELAPSES, REPEAT USING A NEW SPRAY DEVICE AND SPRAY INTO THE OTHER NOSTRIL. CALL 911 AFTER USE.) * EMERGENCY USE ONLY * 1 each 0   omeprazole (PRILOSEC) 20 MG capsule TAKE 1 CAPSULE BY MOUTH ONCE DAILY 30 capsule 5   oxybutynin (DITROPAN-XL) 10 MG 24 hr tablet TAKE 1 TABLET BY MOUTH EVERY NIGHT AT BEDTIME (Patient not taking: Reported on 08/07/2021) 90  tablet 0   Oxycodone HCl 10 MG TABS TAKE 1 TO 2 TABLETS BY MOUTH 3 TIMES DAILY AS NEEDED 90 tablet 0   predniSONE (DELTASONE) 20 MG tablet Take 2 tablets (40 mg total) by mouth daily with breakfast. (Patient not taking: Reported on 06/27/2021) 10 tablet 0   senna-docusate (SENOKOT-S) 8.6-50 MG tablet Take 1 tablet by mouth 2 (two) times daily. 60 tablet 5   silver sulfADIAZINE (SILVADENE) 1 % cream Apply 1 application topically daily. (Patient not taking: Reported on 08/07/2021) 50 g 0   tamsulosin (FLOMAX) 0.4 MG CAPS capsule TAKE ONE CAPSULE BY MOUTH EVERY NIGHT ATBEDTIME 30 capsule 11   traZODone (DESYREL) 100 MG tablet TAKE 3 TABLETS BY MOUTH AT BEDTIME 90 tablet 1   triamcinolone cream (KENALOG) 0.1 % APPLY 1 APPLICATION TOPICALLY TWICE A DAY (Patient not  taking: Reported on 08/07/2021) 30 g 0   TURMERIC PO Take by mouth.     zolendronic acid (ZOMETA) 4 MG/5ML injection Inject 4 mg into the vein every 8 (eight) weeks.     Current Facility-Administered Medications  Medication Dose Route Frequency Provider Last Rate Last Admin   0.9 %  sodium chloride infusion   Intravenous Once Eulogio Bear, NP       Facility-Administered Medications Ordered in Other Visits  Medication Dose Route Frequency Provider Last Rate Last Admin   heparin lock flush 100 unit/mL  500 Units Intravenous Once Lloyd Huger, MD       heparin lock flush 100 unit/mL  500 Units Intracatheter Once PRN Lloyd Huger, MD        OBJECTIVE: There were no vitals filed for this visit.     There is no height or weight on file to calculate BMI.    ECOG FS:2 - Symptomatic, <50% confined to bed  General: Well-developed, well-nourished, no acute distress.  Sitting in a wheelchair. Eyes: Pink conjunctiva, anicteric sclera. HEENT: Normocephalic, moist mucous membranes. Lungs: No audible wheezing or coughing. Heart: Regular rate and rhythm. Abdomen: Soft, nontender, no obvious distention. Musculoskeletal: No edema,  cyanosis, or clubbing. Neuro: Alert, answering all questions appropriately. Cranial nerves grossly intact. Skin: No rashes or petechiae noted. Psych: Normal affect.   LAB RESULTS:  Lab Results  Component Value Date   NA 136 08/07/2021   K 3.5 08/07/2021   CL 101 08/07/2021   CO2 29 08/07/2021   GLUCOSE 134 (H) 08/07/2021   BUN 24 (H) 08/07/2021   CREATININE 1.02 08/07/2021   CALCIUM 8.1 (L) 08/07/2021   PROT 9.0 (H) 08/07/2021   ALBUMIN 2.7 (L) 08/07/2021   AST 25 08/07/2021   ALT 10 08/07/2021   ALKPHOS 22 (L) 08/07/2021   BILITOT 0.6 08/07/2021   GFRNONAA >60 08/07/2021   GFRAA >60 10/13/2019    Lab Results  Component Value Date   WBC 4.2 08/07/2021   NEUTROABS 2.5 08/07/2021   HGB 11.4 (L) 08/07/2021   HCT 34.9 (L) 08/07/2021   MCV 93.3 08/07/2021   PLT 148 (L) 08/07/2021     STUDIES: DG Hip Unilat W or Wo Pelvis 2-3 Views Left  Result Date: 08/18/2021 CLINICAL DATA:  Fall.  Pain. EXAM: DG HIP (WITH OR WITHOUT PELVIS) 2-3V LEFT COMPARISON:  MRI of the hip August 01, 2021. Hip x-rays May 23, 2020. FINDINGS: The patient has known multiple myeloma. Many of the myeloma thus lesions identified on the August 01, 2021 MRI are not well visualized on this study. However, several lytic lesions are identified in the proximal femurs and pelvic bones. Severe degenerative changes in the right hip with loss of joint space and flattening of the femoral head. Moderate degenerative changes in the left hip without significant loss of joint space. No fractures are identified. No other acute abnormalities. IMPRESSION: 1. Lytic lesions as above consistent with known multiple myeloma. 2. No acute fractures identified. 3. Severe degenerative changes of the right hip and moderate degenerative changes in the left hip. Electronically Signed   By: Dorise Bullion III M.D.   On: 08/18/2021 11:42   DG Shoulder Left  Result Date: 08/18/2021 CLINICAL DATA:  86 year old male with history of trauma from  a fall complaining of left shoulder pain. EXAM: LEFT SHOULDER - 2+ VIEW COMPARISON:  None Available. FINDINGS: Three views of the left shoulder demonstrate an acute minimally displaced fracture of the distal third  of the left clavicle with approximately 1/2 shaft width of displacement of the distal fracture fragment. Visualized portions of the left proximal humerus and scapula appear intact. Humeral head appears located on the slightly atypical views provided. Multiple old healed posterior left-sided rib fractures are incidentally noted. IMPRESSION: 1. Acute minimally displaced fracture of the distal third of the left clavicle, as above. Electronically Signed   By: Vinnie Langton M.D.   On: 08/18/2021 11:39   MR HIP RIGHT W WO CONTRAST  Result Date: 08/01/2021 CLINICAL DATA:  Lower back pain, falls, history of multiple myeloma, pain EXAM: MRI OF THE RIGHT HIP WITHOUT AND WITH CONTRAST TECHNIQUE: Multiplanar, multisequence MR imaging was performed both before and after administration of intravenous contrast. CONTRAST:  108m GADAVIST GADOBUTROL 1 MMOL/ML IV SOLN COMPARISON:  Hip radiograph 01/15/2021 FINDINGS: Bones: There are numerous T2 hyperintense/T1 hypointense enhancing lesions of varying sizes throughout the pelvis and proximal femurs as well as the lower lumbar spine which is partially visualized. Of note, the proximal left femoral diaphysis appears entirely placed with tumor. There are lesions in the intertrochanteric regions and in the femoral necks. There is no evidence of fracture. There is flattening of the right femoral head. Articular cartilage and labrum Articular cartilage: Full-thickness cartilage loss of the right hip. Intermediate to high-grade cartilage loss along the left hip. Labrum: Degenerated and torn labrum with residual superior labral tissue posteriorly. Joint or bursal effusion Joint effusion: Moderate to large right hip joint effusion. Trace left hip joint effusion. Bursae: No  significant trochanteric bursitis. Muscles and tendons Muscles and tendons: The right gluteal tendons are intact. Chronic degenerative partial tearing of the left gluteus minimus tendon at the greater trochanter. The left gluteus medius tendon is intact. Mild tendinosis of the proximal hamstrings, right greater than left.There is reactive intramuscular edema within the hip adductors bilaterally, right greater than left. There is bilateral gluteus minimus muscle atrophy. Other findings Miscellaneous: Enlarged prostate gland. Markedly distended bladder with multiple diverticula. Colonic diverticulosis. IMPRESSION: Numerous enhancing lesions of varying sizes throughout the pelvis and proximal femurs, consistent with history of multiple myeloma. Of note, there is a lesion completely replacing the left proximal femoral diaphysis and there are lesions in the intertrochanteric regions and the femoral necks, potentially at risk for pathologic fracture. Severe right hip osteoarthritis with full-thickness cartilage loss and flattening of the femoral head. Degenerated and torn hip labrum. Chronic bilateral obstruction due to an enlarged prostate gland. Electronically Signed   By: JMaurine SimmeringM.D.   On: 08/01/2021 16:04   MR Lumbar Spine W Wo Contrast  Result Date: 08/01/2021 CLINICAL DATA:  low back pain, multiple myeloma. EXAM: MRI LUMBAR SPINE WITHOUT AND WITH CONTRAST TECHNIQUE: Multiplanar and multiecho pulse sequences of the lumbar spine were obtained without and with intravenous contrast. CONTRAST:  756mGADAVIST GADOBUTROL 1 MMOL/ML IV SOLN COMPARISON:  December 2021 FINDINGS: Segmentation:  Standard. Alignment: Stable with minor degenerative listhesis and levocurvature. Vertebrae: Vertebral body heights are similar. There is multifocal abnormal marrow signal. New foci are identified for example at T11, T12, L1, and L5. Involvement of included bony pelvis again noted. Conus medullaris and cauda equina: Conus extends  to the L1 level. Conus and cauda equina appear normal. No abnormal intrathecal enhancement. Paraspinal and other soft tissues: Bladder is distended with numerous diverticula. Disc levels: L1-L2: Disc bulge with endplate osteophytic ridging eccentric to the right. No canal or left foraminal stenosis. Mild right foraminal stenosis. L2-L3: Disc bulge with endplate osteophytic ridging. Mild facet  arthropathy. Minor canal stenosis. Mild to moderate foraminal stenosis. L3-L4: Disc bulge with endplate osteophytic ridging. Moderate facet arthropathy with ligamentum flavum infolding. Moderate to marked canal stenosis. Narrowing of the subarticular recesses. Moderate to marked right and moderate left foraminal stenosis. L4-L5: Disc bulge with endplate osteophytic ridging eccentric to the left. Moderate facet arthropathy with ligamentum infolding. No canal or right foraminal stenosis. Slightly increased moderate to marked left foraminal stenosis. L5-S1: Disc bulge with endplate osteophytic ridging. Mild facet arthropathy. No canal or foraminal stenosis. IMPRESSION: Multifocal abnormal marrow signal reflecting known myeloma. There are new areas of involvement compared to the 2021 study. No new compression fracture. No significant epidural disease. Multilevel degenerative changes as detailed above are similar to the prior study. Canal stenosis remains greatest at L3-L4. Foraminal stenosis remains greatest at L3-L4 and L4-L5. Distended bladder with numerous diverticula. Electronically Signed   By: Macy Mis M.D.   On: 08/01/2021 11:29    ASSESSMENT: Multiple myeloma  PLAN:  Multiple myeloma: Please see note from September 17, 2020 for historic details of his myeloma and treatment.  Patient's most recent M spike has increased significantly to 2.8 and his IgG immunoglobulin component is now 3658.  Kappa chains have increased to 112.5.  Previously, patient was receiving daratumumab every 4 weeks and Zometa every 8 weeks, but  patient requested a chemotherapy holiday and did not receive any treatment between January 15, 2021 and July 10, 2021.  Proceed with daratumumab and Zometa today.  Given the new lesions seen on recent MRI, will give Zometa every 4 weeks at this point.  Return to clinic in 4 weeks for further evaluation and continuation of treatment.       Thrombocytopenia: Platelet count improved to 148.   Back/joint pain: MRI results reviewed independently and report as above consistent with progressive myeloma.  No pathologic fractures reported.  Patient was given a referral to radiation oncology for further evaluation.  He also had consultation with palliative care today.  Continue current narcotics as prescribed.     Peripheral neuropathy: Chronic and unchanged.  Continue gabapentin as prescribed. Insomnia/anxiety: Chronic and unchanged.  Patient is now having his medications adjusted by primary care.     Patient expressed understanding and was in agreement with this plan. He also understands that He can call clinic at any time with any questions, concerns, or complaints.    Cancer Staging  No matching staging information was found for the patient.  Lloyd Huger, MD   08/31/2021 7:40 AM

## 2021-09-02 ENCOUNTER — Encounter: Payer: Self-pay | Admitting: Nurse Practitioner

## 2021-09-02 ENCOUNTER — Other Ambulatory Visit: Payer: Medicare Other | Admitting: Nurse Practitioner

## 2021-09-02 DIAGNOSIS — R5381 Other malaise: Secondary | ICD-10-CM | POA: Diagnosis not present

## 2021-09-02 DIAGNOSIS — R634 Abnormal weight loss: Secondary | ICD-10-CM | POA: Diagnosis not present

## 2021-09-02 DIAGNOSIS — Z515 Encounter for palliative care: Secondary | ICD-10-CM

## 2021-09-02 DIAGNOSIS — C9000 Multiple myeloma not having achieved remission: Secondary | ICD-10-CM | POA: Diagnosis not present

## 2021-09-02 NOTE — Progress Notes (Signed)
[facility] Consult Note Telephone: [phone]  Fax: [phone]    Date of encounter: 09/02/21 3:37 PM PATIENT NAME: Dean [name]. [place] [address]   [phone] (home)  DOB: 07/18/1935 MRN: [id number] PRIMARY CARE PROVIDER:    [name], MD,  [address] [place] [address] [phone] RESPONSIBLE PARTY:    Contact Information     Name Relation Home Work Dean [name]   [phone]   [facility] Spouse [phone]  [phone]   Dean [name] [name]  [phone]      I met face to face with patient and family in home. Palliative Care was asked to follow this patient by consultation request of  [name], MD to address advance care planning and complex medical decision making. This is a follow up visit.                                  ASSESSMENT AND PLAN / RECOMMENDATIONS:  Symptom Management/Plan: Symptom Management/Plan: 1. Advance Care Planning;  Will revisit at next [facility] visit.    2. Goals of Care: Goals include to maximize quality of life and symptom management. Our advance care planning conversation included a discussion about:    The value and importance of advance care planning  Exploration of personal, cultural or spiritual beliefs that might influence medical decisions  Exploration of goals of care in the event of a sudden injury or illness  Identification and preparation of a healthcare agent  Review and updating or creation of an advance directive document   3. Debility secondary to closed displaced fracture of left clavicle with multiple myeloma, new lesions noted, discussed fall risk, ambulates with walker. May benefit from PT if increase in weakness with ongoing treatments. Discussed pain management at length;    4. Weight loss secondary to multiple myeloma. Discussed appetite, nutrition, weights, supplements.  Continue to monitor weights, encourage Dean. [place] eat 3 meals daily with snacks which has been hard for him.  10/13/2019 weight 192 lbs 09/16/2020 weight 184 lbs 03/12/2021 weight 183 lbs 08/07/2021 weight 168 lbs  5. Palliative care encounter; Palliative care encounter; Palliative medicine team will continue to support patient, patient's family, and medical team. Visit consisted of counseling and education dealing with the complex and emotionally intense issues of symptom management and palliative care in the setting of serious and potentially life-threatening illness Follow up Palliative Care Visit: Palliative care will continue to follow for complex medical decision making, advance care planning, and clarification of goals. Return 4 weeks or prn.  I spent 61 minutes providing this consultation. More than 50% of the time in this consultation was spent in counseling and care coordination. PPS: 40% Chief Complaint: Follow up palliative consult for complex medical decision making, address goals, manage ongoing symptoms  HISTORY OF PRESENT ILLNESS:  Dean [name]. is a 86 y.o. year old male  with  multiple medical problems including multiple myeloma h/o chemo/XRT, BPH, GERD, h/o colonic diverticulitis with diverticular abscess, h/o ETOH abuse. I called Pamala [name], Dean. Jacome's [name] confirm pc visit. I visited Dean [name], Dean [name] (wife-with dementia), [name] Pamala [name]. We talked about pc. We talked about ED visit 08/18/2021 from fall with closed displaced fracture of left clavicle. We talked about events surrounding fall. We talked about debility, functional decline, ros including pain. We  talked about sleep, appetite, nutrition. We talked about constipation. We talked about pain including declining fentanyl patches. We talked about new lesions found. We talked about pain management in association to concern pain relief with concern of sedation and constipation. We talked about medical goals. We talked  about role pc in poc. Dean. Gust was in agreement to having in-person f/u pc visit, scheduled. Discussed medical goals with ongoing infusions and upcoming XRT and Orthopedic. We talked about concerns, questions answered. Therapeutic listening, emotional support provided. Appointment scheduled, questions answered. Contact information provided.   History obtained from review of EMR, discussion with primary team, and interview with family, facility staff/caregiver and/or Dean [name].  I reviewed available labs, medications, imaging, studies and related documents from the EMR.  Records reviewed and summarized above.   ROS 10 point system reviewed all negative except HPI  Physical Exam: Constitutional: NAD General: frail appearing, thin, pleasant male EYES:  lids intact ENMT: oral mucous membranes moist CV: S1S2, RRR Pulmonary: decreased bases, no increased work of breathing, no cough, room air Abdomen: soft and non tender MSK: ambulatory with walker Skin: warm and dry Neuro:  + generalized weakness,  no cognitive impairment Psych: non-anxious affect, A and O x 3 Thank you for the opportunity to participate in the care of Dean [name].  The palliative care team will continue to follow. Please call our office at [phone] if we can be of additional assistance.   Danay Mckellar Z Lorenso Quirino, NP   COVID-19 PATIENT SCREENING TOOL Asked and negative response unless otherwise noted:   Have you had symptoms of covid, tested positive or been in contact with someone with symptoms/positive test in the past 5-10 days? NO

## 2021-09-04 ENCOUNTER — Inpatient Hospital Stay (HOSPITAL_BASED_OUTPATIENT_CLINIC_OR_DEPARTMENT_OTHER): Payer: Medicare Other | Admitting: Hospice and Palliative Medicine

## 2021-09-04 ENCOUNTER — Inpatient Hospital Stay: Payer: Medicare Other

## 2021-09-04 ENCOUNTER — Inpatient Hospital Stay: Payer: Medicare Other | Attending: Oncology

## 2021-09-04 ENCOUNTER — Inpatient Hospital Stay (HOSPITAL_BASED_OUTPATIENT_CLINIC_OR_DEPARTMENT_OTHER): Payer: Medicare Other | Admitting: Oncology

## 2021-09-04 ENCOUNTER — Encounter: Payer: Self-pay | Admitting: Oncology

## 2021-09-04 VITALS — BP 112/56 | HR 64 | Temp 97.9°F | Resp 16 | Ht 68.0 in | Wt 168.0 lb

## 2021-09-04 DIAGNOSIS — Z515 Encounter for palliative care: Secondary | ICD-10-CM

## 2021-09-04 DIAGNOSIS — G893 Neoplasm related pain (acute) (chronic): Secondary | ICD-10-CM | POA: Diagnosis not present

## 2021-09-04 DIAGNOSIS — C9000 Multiple myeloma not having achieved remission: Secondary | ICD-10-CM | POA: Diagnosis not present

## 2021-09-04 DIAGNOSIS — Z5112 Encounter for antineoplastic immunotherapy: Secondary | ICD-10-CM | POA: Diagnosis not present

## 2021-09-04 DIAGNOSIS — Z95828 Presence of other vascular implants and grafts: Secondary | ICD-10-CM

## 2021-09-04 DIAGNOSIS — Z79899 Other long term (current) drug therapy: Secondary | ICD-10-CM | POA: Diagnosis not present

## 2021-09-04 LAB — CBC WITH DIFFERENTIAL/PLATELET
Abs Immature Granulocytes: 0.02 10*3/uL (ref 0.00–0.07)
Basophils Absolute: 0 10*3/uL (ref 0.0–0.1)
Basophils Relative: 0 %
Eosinophils Absolute: 0 10*3/uL (ref 0.0–0.5)
Eosinophils Relative: 1 %
HCT: 32.2 % — ABNORMAL LOW (ref 39.0–52.0)
Hemoglobin: 10.1 g/dL — ABNORMAL LOW (ref 13.0–17.0)
Immature Granulocytes: 0 %
Lymphocytes Relative: 29 %
Lymphs Abs: 1.5 10*3/uL (ref 0.7–4.0)
MCH: 29.5 pg (ref 26.0–34.0)
MCHC: 31.4 g/dL (ref 30.0–36.0)
MCV: 94.2 fL (ref 80.0–100.0)
Monocytes Absolute: 0.5 10*3/uL (ref 0.1–1.0)
Monocytes Relative: 10 %
Neutro Abs: 3.2 10*3/uL (ref 1.7–7.7)
Neutrophils Relative %: 60 %
Platelets: 147 10*3/uL — ABNORMAL LOW (ref 150–400)
RBC: 3.42 MIL/uL — ABNORMAL LOW (ref 4.22–5.81)
RDW: 15.1 % (ref 11.5–15.5)
WBC: 5.3 10*3/uL (ref 4.0–10.5)
nRBC: 0 % (ref 0.0–0.2)

## 2021-09-04 LAB — MAGNESIUM: Magnesium: 2.1 mg/dL (ref 1.7–2.4)

## 2021-09-04 LAB — COMPREHENSIVE METABOLIC PANEL
ALT: 8 U/L (ref 0–44)
AST: 20 U/L (ref 15–41)
Albumin: 2.4 g/dL — ABNORMAL LOW (ref 3.5–5.0)
Alkaline Phosphatase: 24 U/L — ABNORMAL LOW (ref 38–126)
Anion gap: 2 — ABNORMAL LOW (ref 5–15)
BUN: 20 mg/dL (ref 8–23)
CO2: 29 mmol/L (ref 22–32)
Calcium: 8.7 mg/dL — ABNORMAL LOW (ref 8.9–10.3)
Chloride: 105 mmol/L (ref 98–111)
Creatinine, Ser: 0.94 mg/dL (ref 0.61–1.24)
GFR, Estimated: >60 mL/min (ref >60)
Glucose, Bld: 84 mg/dL (ref 70–99)
Potassium: 3.7 mmol/L (ref 3.5–5.1)
Sodium: 136 mmol/L (ref 135–145)
Total Bilirubin: 0.3 mg/dL (ref 0.3–1.2)
Total Protein: 8.4 g/dL — ABNORMAL HIGH (ref 6.5–8.1)

## 2021-09-04 MED ORDER — DEXAMETHASONE 4 MG PO TABS
20.0000 mg | ORAL_TABLET | Freq: Once | ORAL | Status: AC
  Administered 2021-09-04: 20 mg via ORAL
  Filled 2021-09-04: qty 5

## 2021-09-04 MED ORDER — DARATUMUMAB-HYALURONIDASE-FIHJ 1800-30000 MG-UT/15ML ~~LOC~~ SOLN
1800.0000 mg | Freq: Once | SUBCUTANEOUS | Status: AC
  Administered 2021-09-04: 1800 mg via SUBCUTANEOUS
  Filled 2021-09-04: qty 15

## 2021-09-04 MED ORDER — SODIUM CHLORIDE 0.9% FLUSH
10.0000 mL | Freq: Once | INTRAVENOUS | Status: AC
  Administered 2021-09-04: 10 mL via INTRAVENOUS
  Filled 2021-09-04: qty 10

## 2021-09-04 MED ORDER — HEPARIN SOD (PORK) LOCK FLUSH 100 UNIT/ML IV SOLN
500.0000 [IU] | Freq: Once | INTRAVENOUS | Status: DC
  Filled 2021-09-04: qty 5

## 2021-09-04 MED ORDER — HEPARIN SOD (PORK) LOCK FLUSH 100 UNIT/ML IV SOLN
500.0000 [IU] | Freq: Once | INTRAVENOUS | Status: AC | PRN
  Administered 2021-09-04: 500 [IU]
  Filled 2021-09-04: qty 5

## 2021-09-04 MED ORDER — ZOLEDRONIC ACID 4 MG/100ML IV SOLN
4.0000 mg | Freq: Once | INTRAVENOUS | Status: AC
  Administered 2021-09-04: 4 mg via INTRAVENOUS
  Filled 2021-09-04: qty 100

## 2021-09-04 MED ORDER — SODIUM CHLORIDE 0.9 % IV SOLN
Freq: Once | INTRAVENOUS | Status: AC
  Filled 2021-09-04: qty 250

## 2021-09-04 MED ORDER — ACETAMINOPHEN 325 MG PO TABS
650.0000 mg | ORAL_TABLET | Freq: Once | ORAL | Status: AC
  Administered 2021-09-04: 650 mg via ORAL
  Filled 2021-09-04: qty 2

## 2021-09-04 MED ORDER — DIPHENHYDRAMINE HCL 25 MG PO CAPS
25.0000 mg | ORAL_CAPSULE | Freq: Once | ORAL | Status: AC
  Administered 2021-09-04: 25 mg via ORAL
  Filled 2021-09-04: qty 1

## 2021-09-04 MED ORDER — PROCHLORPERAZINE MALEATE 10 MG PO TABS
10.0000 mg | ORAL_TABLET | Freq: Once | ORAL | Status: AC
  Administered 2021-09-04: 10 mg via ORAL
  Filled 2021-09-04: qty 1

## 2021-09-04 NOTE — Patient Instructions (Signed)
[facility]  Discharge Instructions: Thank you for choosing [facility] to provide your oncology and hematology care.  If you have a lab appointment with the Cancer Center, please go directly to the Cancer Center and check in at the registration area.  Wear comfortable clothing and clothing appropriate for easy access to any Portacath or PICC line.   We strive to give you quality time with your provider. You may need to reschedule your appointment if you arrive late (15 or more minutes).  Arriving late affects you and other patients whose appointments are after yours.  Also, if you miss three or more appointments without notifying the office, you may be dismissed from the clinic at the provider's discretion.      For prescription refill requests, have your pharmacy contact our office and allow 72 hours for refills to be completed.       To help prevent nausea and vomiting after your treatment, we encourage you to take your nausea medication as directed.  BELOW ARE SYMPTOMS THAT SHOULD BE REPORTED IMMEDIATELY: *FEVER GREATER THAN 100.4 F (38 C) OR HIGHER *CHILLS OR SWEATING *NAUSEA AND VOMITING THAT IS NOT CONTROLLED WITH YOUR NAUSEA MEDICATION *UNUSUAL SHORTNESS OF BREATH *UNUSUAL BRUISING OR BLEEDING *URINARY PROBLEMS (pain or burning when urinating, or frequent urination) *BOWEL PROBLEMS (unusual diarrhea, constipation, pain near the anus) TENDERNESS IN MOUTH AND THROAT WITH OR WITHOUT PRESENCE OF ULCERS (sore throat, sores in mouth, or a toothache) UNUSUAL RASH, SWELLING OR PAIN  UNUSUAL VAGINAL DISCHARGE OR ITCHING   Items with * indicate a potential emergency and should be followed up as soon as possible or go to the Emergency Department if any problems should occur.  Please show the CHEMOTHERAPY ALERT CARD or IMMUNOTHERAPY ALERT CARD at check-in to the Emergency Department and triage nurse.  Should you have questions after your  visit or need to cancel or reschedule your appointment, please contact [facility]  [phone] and follow the prompts.  Office hours are 8:00 a.m. to 4:30 p.m. Monday - Friday. Please note that voicemails left after 4:00 p.m. may not be returned until the following business day.  We are closed weekends and major holidays. You have access to a nurse at all times for urgent questions. Please call the main number to the clinic [phone] and follow the prompts.  For any non-urgent questions, you may also contact your provider using MyChart. We now offer e-Visits for anyone 18 and older to request care online for non-urgent symptoms. For details visit [email or web address].   Also download the MyChart app! Go to the app store, search "MyChart", open the app, select [facility], and log in with your MyChart username and password.  Masks are optional in the cancer centers. If you would like for your care team to wear a mask while they are taking care of you, please let them know. For doctor visits, patients may have with them one support person who is at least 84 years old. At this time, visitors are not allowed in the infusion area.   

## 2021-09-04 NOTE — Progress Notes (Signed)
Nutrition Follow-up:   Patient with multiple myeloma.  Patient resuming treatment today of daratumumab and zometa. Patient being followed by Palliative Care   Met with patient and daughter during infusion.  Patient reports that he is eating.  Daughter says patient does not eat much and tells her not to bring him so much foods.  Daughter prepares meals for him.  Trying to drink carnation instant breakfast mixed with whole milk 2 times per day.      Medications: reviewed  Labs: reviewed  Anthropometrics:   Weight 168 lb on 7/5, not weighed today  178 lb on 5/25 173 lb on 3/22 183 lb on 2/7 176 lb on 1/11  NUTRITION DIAGNOSIS: Inadequate oral intake continues    INTERVENTION:  Encouraged patient to nibble q 2 hours Encouraged patient to continue oral nutrition supplement BID Daughter to continue preparing foods for patient and increasing calories and protein    MONITORING, EVALUATION, GOAL: weight trends, intake   NEXT VISIT: August 30 during infusion (Wednesday)  Jaidev Sanger B. Zenia Resides, [place], [place] Registered Dietitian [phone]

## 2021-09-04 NOTE — Addendum Note (Signed)
Addended by: Altha Harm R on: 09/04/2021 10:42 AM   Modules accepted: Orders

## 2021-09-04 NOTE — Progress Notes (Signed)
Symptom Management and [place] at [facility] [phone] [phone]  Patient Care Team: [name], MD as PCP - General (Family Medicine) Gery Pray, MD as Consulting Physician (Radiation Oncology) Danie Binder, MD (Inactive) as Consulting Physician (Gastroenterology) Michael Boston, MD as Consulting Physician (General Surgery) Lloyd Huger, MD as Consulting Physician (Oncology)   NAME OF PATIENT: Dean [name]  [id number]  [birth year]   DATE OF VISIT: 09/04/21  REASON FOR CONSULT: Dean [name]. is a 86 y.o. male with multiple medical problems including multiple myeloma with disease progression currently on treatment.  Patient has had worsening pain. .   Patient was seen in the ED on 08/18/2021 found to have a left clavicle fracture after a fall.  INTERVAL HISTORY:  Patient endorses ongoing pain but is not regularly taking oxycodone.  He continues to struggle with the care of his wife who has dementia at home.  Denies any neurologic complaints. Denies recent fevers or illnesses. Denies any easy bleeding or bruising. Reports good appetite and denies weight loss. Denies chest pain. Denies any nausea, vomiting, constipation, or diarrhea. Denies urinary complaints. Patient offers no further specific complaints today.  SOCIAL HISTORY:     reports that he has never smoked. He has never used smokeless tobacco. He reports that he does not drink alcohol and does not use drugs.  Patient lives at home with his significant other 25 years.  She has dementia and he is her primary caregiver.  Patient has 2 daughters and a son who lives nearby.  Patient previously worked as a Programmer, systems.  ADVANCE DIRECTIVES:  Does not have  CODE STATUS: DNR/DNI (DNR order signed on 09/04/21)   PAST MEDICAL HISTORY: Past Medical History:  Diagnosis Date   BPH (benign prostatic hyperplasia)    Colonic  diverticular abscess 01/08/2015   Diverticulitis 74/08/14   complicated by abscess and required percutaneous drainage   GERD (gastroesophageal reflux disease)    H/O ETOH abuse    History of chemotherapy last done jan 2017   History of radiation therapy 01/05/13-02/10/13   45 gray to left occipital condyle region   History of radiation therapy 12/01/16-12/10/16   Parasternal nodule, chest- 24 Gy total delivered in 8 fractions, Left sacro-iliac, pelvis- 24 Gy total delivered in 8 fractions    History of radiation therapy 02/19/17-02/22/17   right temporal scalp 30 Gy in 10 fractions   Intra-abdominal abscess (HCC)    Multiple myeloma ([place]) 2015   Neuropathy    Radiation 02/21/14-03/08/14   right posterior chest wall area 30 gray   Radiation 04/19/14-05/02/14   lumbar spine 25 gray   Skull lesion    Left occipital condyle   Wrist fracture, left    x 2    PAST SURGICAL HISTORY:  Past Surgical History:  Procedure Laterality Date   COLONOSCOPY N/A 04/19/2015   Procedure: COLONOSCOPY;  Surgeon: Danie Binder, MD;  Location: [facility];  Service: Endoscopy;  Laterality: N/A;  1:30 PM   CYST EXCISION  1959   tail bone   IR IMAGING GUIDED PORT INSERTION  09/25/2017    HEMATOLOGY/ONCOLOGY HISTORY:  Oncology History  Multiple myeloma without remission ([place])  11/16/2012 Initial Diagnosis   L occipital condyle destructive lesion, not biopsied secondary to location. XRT   12/01/2012 Imaging   L3 superior endplate compression FX, no lytic lesions to suggest myeloma   02/06/2014 Imaging   soft tissue mass along the right  posterior fifth rib with some rib destruction   02/16/2014 Miscellaneous   Normal CBC, Normal CMP, kappa,lamda ratio of 2.62 (abnormal), UIEP with slightly restricted band, IgG kappa, SPEP/IEP with monoclonal protein at 0.79 g/dl, normal igG, suppressed IGM   02/20/2014 Bone Marrow Biopsy   33% kappa restricted plasma cells Normal FISH, normal cytogenetics   02/21/2014 -  03/08/2014 Radiation Therapy   30Gy to R rib lesion, lesion not biopsied   03/16/2014 PET scan   Scattered hypermetabolic osseous lesions, including the calvarium, ribs, left scapula, sacrum, and left femoral shaft. An expansile left vertebral body lesion at L3 extends into the epidural space of the left lateral recess,    03/17/2014 - 04/07/2014 Chemotherapy   Velcade and Dexamethasone due to initial denial of Revlimid by insurance company.  Zometa monthly.   03/22/2014 Imaging   MR_ L-Spine- Enhancing lesions compatible with multiple myeloma at L2, L3, and S1-2.   04/07/2014 - 07/28/2014 Chemotherapy   RVD with Revlimid at 25 mg days 1-14.  Revlimid dose reduced to 25 mg every other day x 14 days with 7 day respite beginning on day 1 of cycle 2.   04/17/2014 Adverse Reaction   Revlimid-induced rash.  Treated with steroids.   07/28/2014 - 08/04/2014 Chemotherapy   Revlimid daily   08/04/2014 Adverse Reaction   Velcade-induced peripheral neuropathy   08/04/2014 Treatment Plan Change   D/C Velcade   08/11/2014 PET scan   Response to therapy. Improvement and resolution of foci of osseous hypermetabolism.   08/22/2014 - 08/28/2014 Chemotherapy   Revlimid 25 mg days 1-21 every 28 days   08/28/2014 Adverse Reaction   Revlimid-induced rash. Medication held.  Medrol dose Pak prescribed.   09/04/2014 - 01/08/2015 Chemotherapy   Revlimid 10 mg every other day, without dexamethasone   01/08/2015 - 01/15/2015 Hospital Admission   Colonic diverticular abscess with IR drain placement   03/02/2015 PET scan   Only bony uptake is low activity at site of deformity/callus at R second rib FX, high activity in sigmoid colon, advanced sigmoid divertidulosis. Abscess not resolved?   06/04/2015 Imaging   CT nonobstructive L renal calculus, diverticulosis of descending and sigmoid colon without inflammation, moderate prostatic enlargement   07/12/2015 Surgery   Diverticulitis s/p robotic sigmoid colectomy with Dr. Johney Maine    08/23/2015 PET scan   Primarily similar hypermetabolic osseous foci within the R sided ribs. new L third rib focus of hypermetab and sclerosis is favored to be related to healing FX. no soft tissue myeloma id'd. Presumed postop hypermetab and edema about the R pelvic wall    03/06/2016 PET scan   1. Reduced activity in the prior lesion such as the right second and fifth rib lesions, activity nearly completely resolved and significantly less than added mediastinal blood pool activity. No new lesions are identified. 2. Other imaging findings of potential clinical significance: Coronary, aortic arch, and branch vessel atherosclerotic vascular disease. Aortoiliac atherosclerotic vascular disease. Enlarged prostate gland. Colonic diverticula.   02/17/2017 PET scan   HEAD/NECK: No hypermetabolic activity in the scalp. No hypermetabolic cervical lymph nodes. CHEST: No hypermetabolic mediastinal or hilar nodes. Right upper lobe scarring/atelectasis. No suspicious pulmonary nodules on the CT scan. ABDOMEN/PELVIS: No abnormal hypermetabolic activity within the liver, pancreas, adrenal glands, or spleen. No hypermetabolic lymph nodes in the abdomen or pelvis. Atherosclerotic calcifications of the abdominal aorta and branch vessels. Left renal sinus cysts. Sigmoid diverticulosis, without evidence of diverticulitis. Prostatomegaly. SKELETON: Vague hypermetabolism involving the inferior sternum, max SUV 3.2,  previously 8.2. Focal hypermetabolism involving the left sacrum, max SUV 4.0, previously 6.7. EXTREMITIES: No abnormal hypermetabolic activity in the lower extremities.   02/18/2017 -  Chemotherapy   Bortezomib 1.42m/m2 QWk + Dexamethasone 115mQTue/Fri --Cycle #1, 02/18/17    02/18/2017 - 08/26/2017 Chemotherapy   The patient had bortezomib SQ (VELCADE) chemo injection 3 mg, 1.3 mg/m2 = 3 mg, Subcutaneous,  Once, 7 of 9 cycles Administration: 3 mg (02/18/2017), 3 mg (02/26/2017), 3 mg (03/04/2017), 3 mg  (03/11/2017), 3 mg (04/08/2017), 3 mg (03/18/2017), 3 mg (03/25/2017), 3 mg (04/01/2017), 3 mg (05/06/2017), 3 mg (05/20/2017), 3 mg (06/03/2017), 3 mg (06/17/2017), 3 mg (07/01/2017), 3 mg (07/15/2017), 3 mg (07/29/2017), 3 mg (08/12/2017), 3 mg (08/26/2017)  for chemotherapy treatment.    09/17/2017 -  Chemotherapy   Patient is on Treatment Plan : MYELOMA Daratumumab q28d       ALLERGIES:  is allergic to codeine, morphine and related, and revlimid [lenalidomide].  MEDICATIONS:  Current Outpatient Medications  Medication Sig Dispense Refill   acyclovir (ZOVIRAX) 400 MG tablet TAKE 1 TABLET BY MOUTH TWICE A DAY 60 tablet 6   ALPRAZolam (XANAX) 1 MG tablet Take 1 tablet (1 mg total) by mouth 3 (three) times daily as needed for anxiety. 90 tablet 0   CALCIUM-VITAMIN D PO Take 1 tablet by mouth 2 (two) times daily.      cephALEXin (KEFLEX) 500 MG capsule Take 1 capsule (500 mg total) by mouth 3 (three) times daily. (Patient not taking: Reported on 04/25/2021) 42 capsule 0   cetirizine (ZYRTEC) 10 MG tablet Take 1 tablet (10 mg total) by mouth daily. 30 tablet 0   dexamethasone (DECADRON) 4 MG tablet  (Patient not taking: Reported on 06/27/2021)     fluticasone (FLONASE) 50 MCG/ACT nasal spray Place 2 sprays into both nostrils daily. 16 g 6   furosemide (LASIX) 20 MG tablet TAKE 1 TABLET BY MOUTH ONCE A DAY AS NEEDED 30 tablet 3   gabapentin (NEURONTIN) 400 MG capsule TAKE 1 CAPSULE BY MOUTH 3 TIMES A DAY 90 capsule 4   Homeopathic Products (THERAWORX RELIEF EX) Apply topically as needed.     ibuprofen (ADVIL) 600 MG tablet Take 1 tablet (600 mg total) by mouth every 8 (eight) hours as needed. 30 tablet 0   lidocaine-prilocaine (EMLA) cream Apply small amount over port one (1) hour prior to appointment. (Patient not taking: Reported on 06/27/2021) 30 g 0   montelukast (SINGULAIR) 10 MG tablet TAKE 1 TABLET BY MOUTH EVERY NIGHT AT BEDTIME 30 tablet 3   naloxone (NARCAN) nasal spray 4 mg/0.1 mL SPRAY 1 SPRAY INTO ONE  NOSTRIL AS DIRECTED FOR OPIOID OVERDOSE (TURN PERSON ON SIDE AFTER DOSE. IF NO RESPONSE IN 2-3 MINUTES OR PERSON RESPONDS BUT RELAPSES, REPEAT USING A NEW SPRAY DEVICE AND SPRAY INTO THE OTHER NOSTRIL. CALL 911 AFTER USE.) * EMERGENCY USE ONLY * 1 each 0   omeprazole (PRILOSEC) 20 MG capsule TAKE 1 CAPSULE BY MOUTH ONCE DAILY 30 capsule 5   oxybutynin (DITROPAN-XL) 10 MG 24 hr tablet TAKE 1 TABLET BY MOUTH EVERY NIGHT AT BEDTIME (Patient not taking: Reported on 08/07/2021) 90 tablet 0   Oxycodone HCl 10 MG TABS TAKE 1 TO 2 TABLETS BY MOUTH 3 TIMES DAILY AS NEEDED 90 tablet 0   predniSONE (DELTASONE) 20 MG tablet Take 2 tablets (40 mg total) by mouth daily with breakfast. (Patient not taking: Reported on 06/27/2021) 10 tablet 0   senna-docusate (SENOKOT-S) 8.6-50 MG tablet Take  1 tablet by mouth 2 (two) times daily. 60 tablet 5   silver sulfADIAZINE (SILVADENE) 1 % cream Apply 1 application topically daily. (Patient not taking: Reported on 08/07/2021) 50 g 0   tamsulosin (FLOMAX) 0.4 MG CAPS capsule TAKE ONE CAPSULE BY MOUTH EVERY NIGHT ATBEDTIME 30 capsule 11   traZODone (DESYREL) 100 MG tablet TAKE 3 TABLETS BY MOUTH AT BEDTIME 90 tablet 1   triamcinolone cream (KENALOG) 0.1 % APPLY 1 APPLICATION TOPICALLY TWICE A DAY (Patient not taking: Reported on 08/07/2021) 30 g 0   TURMERIC PO Take by mouth.     zolendronic acid (ZOMETA) 4 MG/5ML injection Inject 4 mg into the vein every 8 (eight) weeks.     Current Facility-Administered Medications  Medication Dose Route Frequency Provider Last Rate Last Admin   0.9 %  sodium chloride infusion   Intravenous Once Eulogio Bear, NP       Facility-Administered Medications Ordered in Other Visits  Medication Dose Route Frequency Provider Last Rate Last Admin   heparin lock flush 100 unit/mL  500 Units Intravenous Once Lloyd Huger, MD       heparin lock flush 100 unit/mL  500 Units Intracatheter Once PRN Lloyd Huger, MD        VITAL  SIGNS: There were no vitals taken for this visit. There were no vitals filed for this visit.  Estimated body mass index is 25.54 kg/m as calculated from the following:   Height as of an earlier encounter on 09/04/21: 5' 8"  (1.727 m).   Weight as of an earlier encounter on 09/04/21: 168 lb (76.2 kg).  LABS: CBC:    Component Value Date/Time   WBC 5.3 09/04/2021 0839   HGB 10.1 (L) 09/04/2021 0839   HCT 32.2 (L) 09/04/2021 0839   PLT 147 (L) 09/04/2021 0839   MCV 94.2 09/04/2021 0839   NEUTROABS 3.2 09/04/2021 0839   LYMPHSABS 1.5 09/04/2021 0839   MONOABS 0.5 09/04/2021 0839   EOSABS 0.0 09/04/2021 0839   BASOSABS 0.0 09/04/2021 0839   Comprehensive Metabolic Panel:    Component Value Date/Time   NA 136 09/04/2021 0839   K 3.7 09/04/2021 0839   CL 105 09/04/2021 0839   CO2 29 09/04/2021 0839   BUN 20 09/04/2021 0839   CREATININE 0.94 09/04/2021 0839   CREATININE 0.81 03/26/2021 1117   GLUCOSE 84 09/04/2021 0839   CALCIUM 8.7 (L) 09/04/2021 0839   AST 20 09/04/2021 0839   ALT 8 09/04/2021 0839   ALKPHOS 24 (L) 09/04/2021 0839   BILITOT 0.3 09/04/2021 0839   PROT 8.4 (H) 09/04/2021 0839   ALBUMIN 2.4 (L) 09/04/2021 0839    RADIOGRAPHIC STUDIES: DG Hip Unilat W or Wo Pelvis 2-3 Views Left  Result Date: 08/18/2021 CLINICAL DATA:  Fall.  Pain. EXAM: DG HIP (WITH OR WITHOUT PELVIS) 2-3V LEFT COMPARISON:  MRI of the hip August 01, 2021. Hip x-rays May 23, 2020. FINDINGS: The patient has known multiple myeloma. Many of the myeloma thus lesions identified on the August 01, 2021 MRI are not well visualized on this study. However, several lytic lesions are identified in the proximal femurs and pelvic bones. Severe degenerative changes in the right hip with loss of joint space and flattening of the femoral head. Moderate degenerative changes in the left hip without significant loss of joint space. No fractures are identified. No other acute abnormalities. IMPRESSION: 1. Lytic lesions as  above consistent with known multiple myeloma. 2. No acute fractures identified. 3.  Severe degenerative changes of the right hip and moderate degenerative changes in the left hip. Electronically Signed   By: Dorise Bullion III M.D.   On: 08/18/2021 11:42   DG Shoulder Left  Result Date: 08/18/2021 CLINICAL DATA:  87 year old male with history of trauma from a fall complaining of left shoulder pain. EXAM: LEFT SHOULDER - 2+ VIEW COMPARISON:  None Available. FINDINGS: Three views of the left shoulder demonstrate an acute minimally displaced fracture of the distal third of the left clavicle with approximately 1/2 shaft width of displacement of the distal fracture fragment. Visualized portions of the left proximal humerus and scapula appear intact. Humeral head appears located on the slightly atypical views provided. Multiple old healed posterior left-sided rib fractures are incidentally noted. IMPRESSION: 1. Acute minimally displaced fracture of the distal third of the left clavicle, as above. Electronically Signed   By: Vinnie Langton M.D.   On: 08/18/2021 11:39    PERFORMANCE STATUS (ECOG) : 3 - Symptomatic, >50% confined to bed  Review of Systems Unless otherwise noted, a complete review of systems is negative.  Physical Exam General: NAD Pulmonary: Unlabored Extremities: no edema, no joint deformities, left arm in sling Skin: no rashes Neurological: Weakness but otherwise nonfocal  IMPRESSION: Patient's performance status is declining.  Recent fall now with left clavicle fracture and worsening pain.  Patient lives at home with his wife who has dementia.  Daughter lives nearby but is not day or during the day.  Patient was his wife's primary caregiver.  Home palliative care is following.  [name] discussed option of transitioning to hospice but patient was not ready for that.  He reportedly feels better after treatments.  We discussed pain control.  Patient is only sporadically and  infrequently using oxycodone.  Daughter is still concerned about starting a long-acting and so I advised more regular use of oxycodone.  We also discussed increasing his bowel regimen to prevent opioid-induced constipation.  Patient reports that he is a DNR.  He is not interested in resuscitation or life prolonging measures at end-of-life.  I signed a DNR order for him to take home.    PLAN: -Continue current scope of treatment -Appreciate community palliative care as patient will need future transition to hospice -Continue oxycodone as needed for pain -Daily bowel regimen with senna -DNR/DNI  Case and plan discussed with [name]   Patient expressed understanding and was in agreement with this plan. He also understands that He can call clinic at any time with any questions, concerns, or complaints.   Thank you for allowing me to participate in the care of this very pleasant patient.   Time Total: 20 minutes  Visit consisted of counseling and education dealing with the complex and emotionally intense issues of symptom management in the setting of serious illness.Greater than 50%  of this time was spent counseling and coordinating care related to the above assessment and plan.  Signed by: Altha Harm, PhD, NP-C

## 2021-09-05 ENCOUNTER — Encounter (HOSPITAL_COMMUNITY): Payer: Self-pay | Admitting: Oncology

## 2021-09-05 ENCOUNTER — Encounter: Payer: Self-pay | Admitting: Oncology

## 2021-09-05 LAB — IGG, IGA, IGM
IgA: 8 mg/dL — ABNORMAL LOW (ref 61–437)
IgG (Immunoglobin G), Serum: 4815 mg/dL — ABNORMAL HIGH (ref 603–1613)
IgM (Immunoglobulin M), Srm: 10 mg/dL — ABNORMAL LOW (ref 15–143)

## 2021-09-05 LAB — KAPPA/LAMBDA LIGHT CHAINS
Kappa free light chain: 175.5 mg/L — ABNORMAL HIGH (ref 3.3–19.4)
Kappa, lambda light chain ratio: 109.69 — ABNORMAL HIGH (ref 0.26–1.65)
Lambda free light chains: 1.6 mg/L — ABNORMAL LOW (ref 5.7–26.3)

## 2021-09-06 ENCOUNTER — Other Ambulatory Visit: Payer: Self-pay | Admitting: Hospice and Palliative Medicine

## 2021-09-06 ENCOUNTER — Institutional Professional Consult (permissible substitution): Payer: Medicare Other | Admitting: Radiation Oncology

## 2021-09-06 ENCOUNTER — Other Ambulatory Visit: Payer: Self-pay | Admitting: *Deleted

## 2021-09-06 LAB — PROTEIN ELECTROPHORESIS, SERUM
A/G Ratio: 0.6 — ABNORMAL LOW (ref 0.7–1.7)
Albumin ELP: 3.1 g/dL (ref 2.9–4.4)
Alpha-1-Globulin: 0.3 g/dL (ref 0.0–0.4)
Alpha-2-Globulin: 0.7 g/dL (ref 0.4–1.0)
Beta Globulin: 0.8 g/dL (ref 0.7–1.3)
Gamma Globulin: 3.6 g/dL — ABNORMAL HIGH (ref 0.4–1.8)
Globulin, Total: 5.3 g/dL — ABNORMAL HIGH (ref 2.2–3.9)
M-Spike, %: 3.5 g/dL — ABNORMAL HIGH
Total Protein ELP: 8.4 g/dL (ref 6.0–8.5)

## 2021-09-06 MED ORDER — IBUPROFEN 600 MG PO TABS: 600.0000 mg | ORAL_TABLET | Freq: Three times a day (TID) | ORAL | 0 refills | Status: DC | PRN

## 2021-09-06 NOTE — Telephone Encounter (Signed)
Daughter called reporting that Dr Grayland Ormond was to have sent prescription for ibuprofen 800 mg and it has not yet seen sent and she is asking that we please send it TODAY

## 2021-09-09 ENCOUNTER — Ambulatory Visit
Admission: RE | Admit: 2021-09-09 | Discharge: 2021-09-09 | Disposition: A | Discharge status: Home / Self Care | Payer: Medicare Other | Source: Ambulatory Visit | Attending: Radiation Oncology | Admitting: Radiation Oncology

## 2021-09-09 VITALS — BP 109/53 | HR 62 | Resp 18

## 2021-09-09 DIAGNOSIS — C9000 Multiple myeloma not having achieved remission: Secondary | ICD-10-CM | POA: Diagnosis not present

## 2021-09-09 NOTE — Consult Note (Signed)
NEW PATIENT EVALUATION  Name: Dean [name].  MRN: [id number]  Date:   09/09/2021     DOB: 05/28/1935   This 86 y.o. male patient presents to the clinic for initial evaluation of palliative radiation therapy to bony sites and patient with known multiple myeloma.  REFERRING PHYSICIAN: [name], MD  CHIEF COMPLAINT:  Chief Complaint  Patient presents with   Multiple Myeloma    consult    DIAGNOSIS: The encounter diagnosis was Multiple myeloma without remission ([place]).   PREVIOUS INVESTIGATIONS:  MRI scan of L-spine and left hip reviewed Pathology reports reviewed Clinical notes reviewed  HPI: Patient is an 86 year old male with history of multiple treatments for radiation therapy to his left occipital condyle region parasternal region left sacroiliac region pelvis.  He has not been treated with radiation since 2019 when he received right temporal scalp treatment all in [place].  Patient's M spike continues to progress with IgG component of 5300.  He is currently ondaratumumab and Zometa.  He continues to have multiple areas of pain including his left shoulder back hips and pelvis.  Recent MRI scan of his right hip shows lesions throughout the pelvis and proximal femur consistent with multiple myeloma.  There is a lesion completely replacing left proximal femoral diaphysis and a lesion in the intracarotid [place] region and femoral neck possibly at risk for pathologic fracture.  MRI of his lumbar spine shows multifocal abnormal marrow signal consistent with involvement of multiple myeloma T11-L5.  He is seen today for consideration of palliative radiation.  Patient states he fell recently causing some more significant pain in his left upper chest and back.  PLANNED TREATMENT REGIMEN: Single fraction palliative radiation therapy to left hip  PAST MEDICAL HISTORY:  has a past medical history of BPH (benign prostatic hyperplasia), Colonic diverticular abscess (01/08/2015),  Diverticulitis (12/22/14), GERD (gastroesophageal reflux disease), H/O ETOH abuse, History of chemotherapy (last done jan 2017), History of radiation therapy (01/05/13-02/10/13), History of radiation therapy (12/01/16-12/10/16), History of radiation therapy (02/19/17-02/22/17), Intra-abdominal abscess ([place]), Multiple myeloma ([place]) (2015), Neuropathy, Radiation (02/21/14-03/08/14), Radiation (04/19/14-05/02/14), Skull lesion, and Wrist fracture, left.    PAST SURGICAL HISTORY:  Past Surgical History:  Procedure Laterality Date   COLONOSCOPY N/A 04/19/2015   Procedure: COLONOSCOPY;  Surgeon: Danie Binder, MD;  [facility];  Service: Endoscopy;  Laterality: N/A;  1:30 PM   CYST EXCISION  1959   tail bone   IR IMAGING GUIDED PORT INSERTION  09/25/2017    FAMILY HISTORY: family history includes Diabetes in his mother; Hypertension in his father; Stroke in his mother.  SOCIAL HISTORY:  reports that he has never smoked. He has never used smokeless tobacco. He reports that he does not drink alcohol and does not use drugs.  ALLERGIES: Codeine, Morphine and related, and Revlimid [lenalidomide]  MEDICATIONS:  Current Outpatient Medications  Medication Sig Dispense Refill   acyclovir (ZOVIRAX) 400 MG tablet TAKE 1 TABLET BY MOUTH TWICE A DAY 60 tablet 6   ALPRAZolam (XANAX) 1 MG tablet Take 1 tablet (1 mg total) by mouth 3 (three) times daily as needed for anxiety. 90 tablet 0   CALCIUM-VITAMIN D PO Take 1 tablet by mouth 2 (two) times daily.      cephALEXin (KEFLEX) 500 MG capsule Take 1 capsule (500 mg total) by mouth 3 (three) times daily. (Patient not taking: Reported on 04/25/2021) 42 capsule 0   cetirizine (ZYRTEC) 10 MG tablet Take 1 tablet (10 mg total) by mouth  daily. 30 tablet 0   dexamethasone (DECADRON) 4 MG tablet  (Patient not taking: Reported on 06/27/2021)     fluticasone (FLONASE) 50 MCG/ACT nasal spray Place 2 sprays into both nostrils daily. 16 g 6   furosemide (LASIX) 20 MG  tablet TAKE 1 TABLET BY MOUTH ONCE A DAY AS NEEDED 30 tablet 3   gabapentin (NEURONTIN) 400 MG capsule TAKE 1 CAPSULE BY MOUTH 3 TIMES A DAY 90 capsule 4   Homeopathic Products (THERAWORX RELIEF EX) Apply topically as needed.     ibuprofen (ADVIL) 600 MG tablet Take 1 tablet (600 mg total) by mouth every 8 (eight) hours as needed. 30 tablet 0   lidocaine-prilocaine (EMLA) cream Apply small amount over port one (1) hour prior to appointment. (Patient not taking: Reported on 06/27/2021) 30 g 0   montelukast (SINGULAIR) 10 MG tablet TAKE 1 TABLET BY MOUTH EVERY NIGHT AT BEDTIME 30 tablet 3   naloxone (NARCAN) nasal spray 4 mg/0.1 mL SPRAY 1 SPRAY INTO ONE NOSTRIL AS DIRECTED FOR OPIOID OVERDOSE (TURN PERSON ON SIDE AFTER DOSE. IF NO RESPONSE IN 2-3 MINUTES OR PERSON RESPONDS BUT RELAPSES, REPEAT USING A NEW SPRAY DEVICE AND SPRAY INTO THE OTHER NOSTRIL. CALL 911 AFTER USE.) * EMERGENCY USE ONLY * 1 each 0   omeprazole (PRILOSEC) 20 MG capsule TAKE 1 CAPSULE BY MOUTH ONCE DAILY 30 capsule 5   oxybutynin (DITROPAN-XL) 10 MG 24 hr tablet TAKE 1 TABLET BY MOUTH EVERY NIGHT AT BEDTIME (Patient not taking: Reported on 08/07/2021) 90 tablet 0   Oxycodone HCl 10 MG TABS TAKE 1 TO 2 TABLETS BY MOUTH 3 TIMES DAILY AS NEEDED 90 tablet 0   predniSONE (DELTASONE) 20 MG tablet Take 2 tablets (40 mg total) by mouth daily with breakfast. (Patient not taking: Reported on 06/27/2021) 10 tablet 0   senna-docusate (SENOKOT-S) 8.6-50 MG tablet Take 1 tablet by mouth 2 (two) times daily. 60 tablet 5   silver sulfADIAZINE (SILVADENE) 1 % cream Apply 1 application topically daily. (Patient not taking: Reported on 08/07/2021) 50 g 0   tamsulosin (FLOMAX) 0.4 MG CAPS capsule TAKE ONE CAPSULE BY MOUTH EVERY NIGHT ATBEDTIME 30 capsule 11   traZODone (DESYREL) 100 MG tablet TAKE 3 TABLETS BY MOUTH AT BEDTIME 90 tablet 1   triamcinolone cream (KENALOG) 0.1 % APPLY 1 APPLICATION TOPICALLY TWICE A DAY (Patient not taking: Reported on  08/07/2021) 30 g 0   TURMERIC PO Take by mouth.     zolendronic acid (ZOMETA) 4 MG/5ML injection Inject 4 mg into the vein every 8 (eight) weeks.     Current Facility-Administered Medications  Medication Dose Route Frequency Provider Last Rate Last Admin   0.9 %  sodium chloride infusion   Intravenous Once Eulogio Bear, NP       Facility-Administered Medications Ordered in Other Encounters  Medication Dose Route Frequency Provider Last Rate Last Admin   heparin lock flush 100 unit/mL  500 Units Intravenous Once Lloyd Huger, MD       heparin lock flush 100 unit/mL  500 Units Intracatheter Once PRN Lloyd Huger, MD        ECOG PERFORMANCE STATUS:  2 - Symptomatic, <50% confined to bed  REVIEW OF SYSTEMS: Patient denies any weight loss, fatigue, weakness, fever, chills or night sweats. Patient denies any loss of vision, blurred vision. Patient denies any ringing  of the ears or hearing loss. No irregular heartbeat. Patient denies heart murmur or history of fainting. Patient denies any  chest pain or pain radiating to her upper extremities. Patient denies any shortness of breath, difficulty breathing at night, cough or hemoptysis. Patient denies any swelling in the lower legs. Patient denies any nausea vomiting, vomiting of blood, or coffee ground material in the vomitus. Patient denies any stomach pain. Patient states has had normal bowel movements no significant constipation or diarrhea. Patient denies any dysuria, hematuria or significant nocturia. Patient denies any problems walking, swelling in the joints or loss of balance. Patient denies any skin changes, loss of hair or loss of weight. Patient denies any excessive worrying or anxiety or significant depression. Patient denies any problems with insomnia. Patient denies excessive thirst, polyuria, polydipsia. Patient denies any swollen glands, patient denies easy bruising or easy bleeding. Patient denies any recent infections,  allergies or URI. Patient "s visual fields have not changed significantly in recent time.   PHYSICAL EXAM: BP (!) 109/53   Pulse 62   Resp 18  Well-developed well-nourished patient in NAD. HEENT reveals PERLA, EOMI, discs not visualized.  Oral cavity is clear. No oral mucosal lesions are identified. Neck is clear without evidence of cervical or supraclavicular adenopathy. Lungs are clear to A&P. Cardiac examination is essentially unremarkable with regular rate and rhythm without murmur rub or thrill. Abdomen is benign with no organomegaly or masses noted. Motor sensory and DTR levels are equal and symmetric in the upper and lower extremities. Cranial nerves II through XII are grossly intact. Proprioception is intact. No peripheral adenopathy or edema is identified. No motor or sensory levels are noted. Crude visual fields are within normal range.  LABORATORY DATA: Pathology and labs reviewed    RADIOLOGY RESULTS: MRI of left hip as well as lumbar spine reviewed bone scan ordered   IMPRESSION: Progressive multiple myeloma in 86 year old male for palliative radiation  PLAN: At this time like to go ahead with single fraction 8 Gray to his left hip.  I have personally set up and ordered CT simulation for the middle of this week and will have his first treatment and by early next week.  I am also ordering a bone scan to better delineate areas of potential palliative benefit and that we will be performed the next week.  I have personally set up and ordered CT simulation.  Patient comprehends my recommendations well.  I would like to take this opportunity to thank you for allowing me to participate in the care of your patient.Noreene Filbert, MD

## 2021-09-11 ENCOUNTER — Ambulatory Visit
Admission: RE | Admit: 2021-09-11 | Discharge: 2021-09-11 | Disposition: A | Discharge status: Home / Self Care | Payer: Medicare Other | Source: Ambulatory Visit | Attending: Radiation Oncology | Admitting: Radiation Oncology

## 2021-09-11 DIAGNOSIS — C9000 Multiple myeloma not having achieved remission: Secondary | ICD-10-CM | POA: Diagnosis not present

## 2021-09-11 DIAGNOSIS — Z510 Encounter for antineoplastic radiation therapy: Secondary | ICD-10-CM | POA: Diagnosis not present

## 2021-09-12 DIAGNOSIS — Z510 Encounter for antineoplastic radiation therapy: Secondary | ICD-10-CM | POA: Diagnosis not present

## 2021-09-12 DIAGNOSIS — C9000 Multiple myeloma not having achieved remission: Secondary | ICD-10-CM | POA: Diagnosis not present

## 2021-09-16 ENCOUNTER — Other Ambulatory Visit: Payer: Self-pay | Admitting: Oncology

## 2021-09-16 DIAGNOSIS — C9000 Multiple myeloma not having achieved remission: Secondary | ICD-10-CM

## 2021-09-16 DIAGNOSIS — G4700 Insomnia, unspecified: Secondary | ICD-10-CM

## 2021-09-17 ENCOUNTER — Other Ambulatory Visit: Payer: Self-pay

## 2021-09-17 ENCOUNTER — Ambulatory Visit
Admission: RE | Admit: 2021-09-17 | Discharge: 2021-09-17 | Disposition: A | Discharge status: Home / Self Care | Payer: Medicare Other | Source: Ambulatory Visit | Attending: Radiation Oncology | Admitting: Radiation Oncology

## 2021-09-17 DIAGNOSIS — C9000 Multiple myeloma not having achieved remission: Secondary | ICD-10-CM | POA: Diagnosis not present

## 2021-09-17 DIAGNOSIS — Z510 Encounter for antineoplastic radiation therapy: Secondary | ICD-10-CM | POA: Diagnosis not present

## 2021-09-17 LAB — RAD ONC ARIA SESSION SUMMARY
Course Elapsed Days: 0
Plan Fractions Treated to Date: 1
Plan Prescribed Dose Per Fraction: 8 Gy
Plan Total Fractions Prescribed: 1
Plan Total Prescribed Dose: 8 Gy
Reference Point Dosage Given to Date: 8 Gy
Reference Point Session Dosage Given: 8 Gy
Session Number: 1

## 2021-09-18 ENCOUNTER — Inpatient Hospital Stay: Payer: Medicare Other | Admitting: Occupational Therapy

## 2021-09-18 DIAGNOSIS — M6281 Muscle weakness (generalized): Secondary | ICD-10-CM

## 2021-09-18 DIAGNOSIS — S42002A Fracture of unspecified part of left clavicle, initial encounter for closed fracture: Secondary | ICD-10-CM | POA: Diagnosis not present

## 2021-09-18 NOTE — Therapy (Signed)
[facility] at [facility], [place], [place], [address] Phone: [phone]   Fax:  [phone]  Occupational Therapy Screen:  Patient Details  Name: Dean [name]. MRN: [id number] Date of Birth: 1936/01/08 No data recorded  Encounter Date: 09/18/2021   OT End of Session - 09/18/21 1016     Visit Number 0             Past Medical History:  Diagnosis Date   BPH (benign prostatic hyperplasia)    Colonic diverticular abscess 01/08/2015   Diverticulitis 33/29/51   complicated by abscess and required percutaneous drainage   GERD (gastroesophageal reflux disease)    H/O ETOH abuse    History of chemotherapy last done jan 2017   History of radiation therapy 01/05/13-02/10/13   45 gray to left occipital condyle region   History of radiation therapy 12/01/16-12/10/16   Parasternal nodule, chest- 24 Gy total delivered in 8 fractions, Left sacro-iliac, pelvis- 24 Gy total delivered in 8 fractions    History of radiation therapy 02/19/17-02/22/17   right temporal scalp 30 Gy in 10 fractions   Intra-abdominal abscess ([facility])    Multiple myeloma ([place]) 2015   Neuropathy    Radiation 02/21/14-03/08/14   right posterior chest wall area 30 gray   Radiation 04/19/14-05/02/14   lumbar spine 25 gray   Skull lesion    Left occipital condyle   Wrist fracture, left    x 2    Past Surgical History:  Procedure Laterality Date   COLONOSCOPY N/A 04/19/2015   Procedure: COLONOSCOPY;  Surgeon: Danie Binder, MD;  [facility];  Service: Endoscopy;  Laterality: N/A;  1:30 PM   CYST EXCISION  1959   tail bone   IR IMAGING GUIDED PORT INSERTION  09/25/2017    There were no vitals filed for this visit.   Subjective Assessment - 09/18/21 1014     Subjective  Patient arrived with daughter in wheelchair.  Did take a oxycodone this morning.  Per daughter patient not eating.  More sleepy today.  Had a question on his medication that  he is getting at the moment.    Currently in Pain? Yes    Pain Score 8     Pain Location Back    Pain Descriptors / Indicators Aching    Pain Type Chronic pain    Pain Onset More than a month ago    Pain Frequency Constant             IMPRESSION Dean Borders NP 09/04/21: Patient's performance status is declining.  Recent fall now with left clavicle fracture and worsening pain.  Patient lives at home with his wife who has dementia.  Daughter lives nearby but is not day or during the day.  Patient was his wife's primary caregiver.  Home palliative care is following.   [name] discussed option of transitioning to hospice but patient was not ready for that.  He reportedly feels better after treatments.   We discussed pain control.  Patient is only sporadically and infrequently using oxycodone.  Daughter is still concerned about starting a long-acting and so I advised more regular use of oxycodone.  We also discussed increasing his bowel regimen to prevent opioid-induced constipation.   Patient reports that he is a DNR.  He is not interested in resuscitation or life prolonging measures at end-of-life.  I signed a DNR order for him to take home.  PLAN: -Continue current scope of treatment -Appreciate community palliative care as patient will need future transition to hospice -Continue oxycodone as needed for pain -Daily bowel regimen with senna -DNR/DNI   Case and plan discussed with [name]     OT SCREEN 08/07/21:   Patient referred by palliative care with today because of frequent falls with the last 1 to 2 weeks ago.  Patient was seen the last time by this OT in January/23.  At that time patient was referred for home health PT and OT. Patient with increased pain in lower back and hips limiting his activity level and causing frequent falls.  Palliative care did increase his pain medicine today.  Patient also starting radiation for pain management.  Patient reports he had  physical therapy at home that he finished about 3 months ago. He was doing better but then had increased pain and was not able to do his home exercises since then. Patient has a shower chair but it is too big for the shower and he cannot wash his feet.  Friend can put rail in shower - walk in shower. Patient using a rolling walker at home. Pt using RW at home. 4 stairs to get in the house. His significant other has advanced dementia but is able to help him with tying his shoes as well as helping and positioning his walker coming up the stairs into the house. Patient was able to do sit to stand independently using hands.  Was able to ambulate holding onto the counter and chair.  Did not perform BERG balance test this date because of pain. HEP: -Community palliative care was ordered to follow him at home -Patient still has handout from PT 3 months ago.-Feels if pain medicine is working he can start back with exercises. -Patient to use rolling walker and ask family to attach with took his reacher to walker -Recommended a hip kit that has a sock aid as well as a long-handle brush or sponge to bathe feet and shower so he can use a shower chair in the shower. -Fall prevention handout was reviewed with patient making sure having an nightlight as well as picking up loose carpets in the house -His 1 daughter bring them some meals few times a week-declined Meals on Wheels   Pt to follow-up with me in 5 weeks again.  09/18/21 OT SCREEN: Patient arrived in wheelchair with daughter.  Patient very sleepy today.  Not able to keep eyes open.  Daughter reports patient is not eating.  And more weak today and sleepy. Patient declines hospice.  Palliative care would not be in until next week for second visit per daughter.  Next oncology appointment is not until the end of the month. Since last seen by OT patient had 1 fall.  Resulting in a fracture of the clavicle. This date patient unable to get out of wheelchair.   Declined to push up.  Patient report he pulls up.  Unable to do balance test or mobility. His partner of 30 years is his caretaker.  She has dementia. Daughter do check in with him every day.  Bring meals. House can be very cluttered.  She tried move things but the caretaker with dementia moves things back.  Has paranoia per daughter. Remind patient again about recommendations from dietitian to eat every 2 hours.  He needs to continue with his pain medicine. To be able to keep his strength up and his mobility to prevent falls. Daughter had questions about  his medication he is on. Discussed case with Dean [name] nurse practitioner-he will follow-up with Dean [name] today. Discussed with daughter if she is there can practice with him sit and stand, walking to the bathroom. No follow-up needed with this OT.  Patient needs hospice or home health PT/OT but patient declining hospic and palliative care do not have therapy.                                   Patient will benefit from skilled therapeutic intervention in order to improve the following deficits and impairments:           Visit Diagnosis: Muscle weakness (generalized)    Problem List Patient Active Problem List   Diagnosis Date Noted   Spinal stenosis, lumbar 10/13/2016   Chronic back pain 10/01/2016   DDD (degenerative disc disease), lumbar 10/01/2016   OA (osteoarthritis) of knee 10/01/2016   Diverticulitis s/p robotic sigmoid colectomy 07/11/2015 07/11/2015   Goals of care, counseling/discussion    Abnormal CT scan, sigmoid colon 04/02/2015   Abnormal PET scan of colon 04/02/2015   Thrombocytopenia ([place]) 01/09/2015   Multiple myeloma without remission ([place]) 02/15/2014   Allergic rhinitis 01/16/2014   History of vertebral stress fracture 01/16/2014   Dermatitis 01/16/2014   Loss of weight 09/05/2013   Insomnia 09/05/2013   Fatigue 05/16/2013   Neck pain 12/07/2012   Skull lesion 11/29/2012    Neuropathy ([place])    BPH (benign prostatic hyperplasia)    GERD (gastroesophageal reflux disease)    H/O ETOH abuse     Dean [name], OTR/L,CLT 09/18/2021, 10:17 AM  [facility] at [facility] [address] [place], [place], [address] Phone: [phone]   Fax:  [phone]  Name: Dean [name]. MRN: [id number] Date of Birth: [birth year]

## 2021-09-19 ENCOUNTER — Encounter
Admission: RE | Admit: 2021-09-19 | Discharge: 2021-09-19 | Disposition: A | Discharge status: Home / Self Care | Payer: Medicare Other | Source: Ambulatory Visit | Attending: Radiation Oncology | Admitting: Radiation Oncology

## 2021-09-19 DIAGNOSIS — S42002A Fracture of unspecified part of left clavicle, initial encounter for closed fracture: Secondary | ICD-10-CM | POA: Diagnosis not present

## 2021-09-19 DIAGNOSIS — C9000 Multiple myeloma not having achieved remission: Secondary | ICD-10-CM | POA: Insufficient documentation

## 2021-09-19 DIAGNOSIS — R296 Repeated falls: Secondary | ICD-10-CM | POA: Diagnosis not present

## 2021-09-19 DIAGNOSIS — S2242XA Multiple fractures of ribs, left side, initial encounter for closed fracture: Secondary | ICD-10-CM | POA: Diagnosis not present

## 2021-09-19 MED ORDER — TECHNETIUM TC 99M MEDRONATE IV KIT
20.0000 | PACK | Freq: Once | INTRAVENOUS | Status: AC | PRN
  Administered 2021-09-19: 21.35 via INTRAVENOUS

## 2021-09-24 ENCOUNTER — Encounter: Payer: Self-pay | Admitting: Nurse Practitioner

## 2021-09-24 ENCOUNTER — Other Ambulatory Visit: Payer: Medicare Other | Admitting: Nurse Practitioner

## 2021-09-24 DIAGNOSIS — R630 Anorexia: Secondary | ICD-10-CM | POA: Diagnosis not present

## 2021-09-24 DIAGNOSIS — C9000 Multiple myeloma not having achieved remission: Secondary | ICD-10-CM

## 2021-09-24 DIAGNOSIS — R5381 Other malaise: Secondary | ICD-10-CM | POA: Diagnosis not present

## 2021-09-24 NOTE — Progress Notes (Signed)
[name] Consult Note Telephone: [phone]  Fax: [phone]    Date of encounter: 09/24/21 3:22 PM PATIENT NAME: Dean [name]. [place] [address]   [phone] (home)  DOB: [birth year] MRN: [id number] PRIMARY CARE PROVIDER:    [name], MD,  [place] [address] [place] [address] [phone]  RESPONSIBLE PARTY:    Contact Information     Name Relation Home Work [employer] Daughter   [phone]   [facility] Spouse [phone]  [phone]   [name],Dean Daughter  [phone]     Palliative Care was asked to follow this patient by consultation request of  Dean Frizzle, MD to address advance care planning and complex medical decision making. This is a follow up visit.                                  ASSESSMENT AND PLAN / RECOMMENDATIONS:  Symptom Management/Plan: 1. Advance Care Planning;  DNR   2. Pain secondary from clavicle fx; discussed pain level, current regimen will continue.    3. Debility secondary to closed displaced fracture of left clavicle with multiple myeloma, new lesions noted, discussed fall risk, ambulates with walker. May benefit from PT if increase in weakness with ongoing treatments. Discussed pain management at length;    4. Anorexia stable secondary to multiple myeloma. Discussed appetite, nutrition, weights, supplements. Continue to monitor weights, encourage Dean [name] eat 3 meals daily with snacks which has been hard for him. Dean [name] had many questions about daratumumab and Zometa, other medications he had previously been on. Dean [name] endorses she will further discuss with Dean [name] when they have their next visit.  10/13/2019 weight 192 lbs 09/16/2020 weight 184 lbs 03/12/2021 weight 183 lbs 08/07/2021 weight 168 lbs 09/04/2021 weight 168 lbs BMI 25.54 5. Palliative care encounter; Palliative care encounter; Palliative  medicine team will continue to support patient, patient's family, and medical team. Visit consisted of counseling and education dealing with the complex and emotionally intense issues of symptom management and palliative care in the setting of serious and potentially life-threatening illness Follow up Palliative Care Visit: Palliative care will continue to follow for complex medical decision making, advance care planning, and clarification of goals. Return 4 weeks or prn.   I spent 32 minutes providing this consultation. More than 50% of the time in this consultation was spent in counseling and care coordination. PPS: 40% Chief Complaint: Follow up palliative consult for complex medical decision making, address goals, manage ongoing symptoms   HISTORY OF PRESENT ILLNESS:  [name]. is a 86 y.o. year old male  with  multiple medical problems including multiple myeloma h/o chemo/XRT, BPH, GERD, h/o colonic diverticulitis with diverticular abscess, h/o ETOH abuse. I called Dean [name], for telephonic telemedicine visit as video not available. We talked about recent clavical fx, current treatment plan. We talked about how Dean [name] has been feeling, having harder days. We talked about functional limitations, ros including pain. We talked medical goals. Dean [name] had many questions about medications, chemotherapy, multiple myeloma treatments which she will continue to discuss with Dean [name] at next appointment. We talked about appetite, sleep patterns. We talked about overall challenges with debility. Therapeutic listening, emotional support provided. Questions answered. Discussed f/u pc visit, scheduled.   We talked about concerns, questions answered. Therapeutic listening, emotional support provided.  Appointment scheduled, questions answered. Contact information provided.    History obtained from review of EMR, discussion with Dean [name], daughter with Dean [name].  I reviewed available labs, medications,  imaging, studies and related documents from the EMR.  Records reviewed and summarized above.    ROS 10 point system reviewed all negative except HPI   Physical Exam: Deferred   Thank you for the opportunity to participate in the care of Dean [name].  The palliative care team will continue to follow. Please call our office at [phone] if we can be of additional assistance.   Dean Seyler Ihor Gully, NP

## 2021-10-01 NOTE — Progress Notes (Signed)
[place]  Telephone:([phone] [phone]  ID: Dean [name]. OB: [birth year]  MR#: [id number]  SFS#:[id number]  Patient Care Team: [name], MD as PCP - General (Family Medicine) Gery Pray, MD as Consulting Physician (Radiation Oncology) Danie Binder, MD (Inactive) as Consulting Physician (Gastroenterology) Michael Boston, MD as Consulting Physician (General Surgery) Lloyd Huger, MD as Consulting Physician (Oncology)  CHIEF COMPLAINT: Multiple myeloma  INTERVAL HISTORY: Patient returns to clinic today for repeat laboratory work, further evaluation, and continuation of daratumumab and Zometa.  He continues to have a decreased performance status and occasional falls.  He has chronic weakness and fatigue.  He continues to have bilateral hip pain.  He has no neurologic complaints.  He denies any recent fevers or illnesses.  He has a poor appetite.  He has no chest pain, shortness of breath, cough, or hemoptysis.  He denies any nausea, vomiting, constipation, or diarrhea.  He has no urinary complaints.  Patient offers no further specific complaints today.    REVIEW OF SYSTEMS:   Review of Systems  Constitutional:  Positive for malaise/fatigue. Negative for fever and weight loss.  Respiratory: Negative.  Negative for cough, hemoptysis and shortness of breath.   Cardiovascular: Negative.  Negative for chest pain and leg swelling.  Gastrointestinal: Negative.  Negative for abdominal pain and nausea.  Genitourinary: Negative.  Negative for dysuria.  Musculoskeletal:  Positive for back pain, falls and joint pain.  Skin: Negative.  Negative for rash.  Neurological:  Positive for sensory change and weakness. Negative for dizziness, focal weakness and headaches.  Psychiatric/Behavioral: Negative.  The patient is not nervous/anxious and does not have insomnia.     As per HPI. Otherwise, a complete review of systems is negative.  PAST  MEDICAL HISTORY: Past Medical History:  Diagnosis Date   BPH (benign prostatic hyperplasia)    Colonic diverticular abscess 01/08/2015   Diverticulitis 34/35/68   complicated by abscess and required percutaneous drainage   GERD (gastroesophageal reflux disease)    H/O ETOH abuse    History of chemotherapy last done jan 2017   History of radiation therapy 01/05/13-02/10/13   45 gray to left occipital condyle region   History of radiation therapy 12/01/16-12/10/16   Parasternal nodule, chest- 24 Gy total delivered in 8 fractions, Left sacro-iliac, pelvis- 24 Gy total delivered in 8 fractions    History of radiation therapy 02/19/17-02/22/17   right temporal scalp 30 Gy in 10 fractions   Intra-abdominal abscess (HCC)    Multiple myeloma ([place]) 2015   Neuropathy    Radiation 02/21/14-03/08/14   right posterior chest wall area 30 gray   Radiation 04/19/14-05/02/14   lumbar spine 25 gray   Skull lesion    Left occipital condyle   Wrist fracture, left    x 2    PAST SURGICAL HISTORY: Past Surgical History:  Procedure Laterality Date   COLONOSCOPY N/A 04/19/2015   Procedure: COLONOSCOPY;  Surgeon: Danie Binder, MD;  Location: [facility];  Service: Endoscopy;  Laterality: N/A;  1:30 PM   CYST EXCISION  1959   tail bone   IR IMAGING GUIDED PORT INSERTION  09/25/2017    FAMILY HISTORY: Family History  Problem Relation Age of Onset   Diabetes Mother    Stroke Mother    Hypertension Father     ADVANCED DIRECTIVES (Y/N):  N  HEALTH MAINTENANCE: Social History   Tobacco Use   Smoking status: Never   Smokeless tobacco: Never  Vaping Use   Vaping Use: Never used  Substance Use Topics   Alcohol use: No    Comment: " Not much no more"   Drug use: No     Colonoscopy:  PAP:  Bone density:  Lipid panel:  Allergies  Allergen Reactions   Codeine Other (See Comments)    Headache   Morphine And Related Other (See Comments)    Makes him feel weird   Revlimid [Lenalidomide]  Other (See Comments)    "Causes me to become weak"    Current Outpatient Medications  Medication Sig Dispense Refill   acyclovir (ZOVIRAX) 400 MG tablet TAKE 1 TABLET BY MOUTH TWICE A DAY 60 tablet 6   ALPRAZolam (XANAX) 1 MG tablet TAKE 1 TABLET BY MOUTH 3 TIMES DAILY AS NEEDED FOR ANXIETY 90 tablet 0   CALCIUM-VITAMIN D PO Take 1 tablet by mouth 2 (two) times daily.      cetirizine (ZYRTEC) 10 MG tablet Take 1 tablet (10 mg total) by mouth daily. 30 tablet 0   fluticasone (FLONASE) 50 MCG/ACT nasal spray Place 2 sprays into both nostrils daily. 16 g 6   furosemide (LASIX) 20 MG tablet TAKE 1 TABLET BY MOUTH ONCE A DAY AS NEEDED 30 tablet 3   Homeopathic Products (THERAWORX RELIEF EX) Apply topically as needed.     ibuprofen (ADVIL) 600 MG tablet Take 1 tablet (600 mg total) by mouth every 8 (eight) hours as needed. 30 tablet 0   montelukast (SINGULAIR) 10 MG tablet TAKE 1 TABLET BY MOUTH EVERY NIGHT AT BEDTIME 30 tablet 3   naloxone (NARCAN) nasal spray 4 mg/0.1 mL SPRAY 1 SPRAY INTO ONE NOSTRIL AS DIRECTED FOR OPIOID OVERDOSE (TURN PERSON ON SIDE AFTER DOSE. IF NO RESPONSE IN 2-3 MINUTES OR PERSON RESPONDS BUT RELAPSES, REPEAT USING A NEW SPRAY DEVICE AND SPRAY INTO THE OTHER NOSTRIL. CALL 911 AFTER USE.) * EMERGENCY USE ONLY * 1 each 0   omeprazole (PRILOSEC) 20 MG capsule TAKE 1 CAPSULE BY MOUTH ONCE DAILY 30 capsule 5   Oxycodone HCl 10 MG TABS TAKE 1 TO 2 TABLETS BY MOUTH 3 TIMES DAILY AS NEEDED 90 tablet 0   senna-docusate (SENOKOT-S) 8.6-50 MG tablet Take 1 tablet by mouth 2 (two) times daily. 60 tablet 5   tamsulosin (FLOMAX) 0.4 MG CAPS capsule TAKE ONE CAPSULE BY MOUTH EVERY NIGHT ATBEDTIME 30 capsule 11   traZODone (DESYREL) 100 MG tablet TAKE 3 TABLETS BY MOUTH AT BEDTIME 90 tablet 1   TURMERIC PO Take by mouth.     zolendronic acid (ZOMETA) 4 MG/5ML injection Inject 4 mg into the vein every 8 (eight) weeks.     cephALEXin (KEFLEX) 500 MG capsule Take 1 capsule (500 mg total) by  mouth 3 (three) times daily. (Patient not taking: Reported on 04/25/2021) 42 capsule 0   dexamethasone (DECADRON) 4 MG tablet  (Patient not taking: Reported on 06/27/2021)     dexamethasone (DECADRON) 4 MG tablet Take 1 tablet (4 mg total) by mouth daily. 15 tablet 0   gabapentin (NEURONTIN) 400 MG capsule TAKE 1 CAPSULE BY MOUTH 3 TIMES A DAY 90 capsule 4   lidocaine-prilocaine (EMLA) cream Apply small amount over port one (1) hour prior to appointment. (Patient not taking: Reported on 06/27/2021) 30 g 0   oxybutynin (DITROPAN-XL) 10 MG 24 hr tablet TAKE 1 TABLET BY MOUTH EVERY NIGHT AT BEDTIME (Patient not taking: Reported on 08/07/2021) 90 tablet 0   predniSONE (DELTASONE) 20 MG tablet Take 2 tablets (  40 mg total) by mouth daily with breakfast. (Patient not taking: Reported on 06/27/2021) 10 tablet 0   silver sulfADIAZINE (SILVADENE) 1 % cream Apply 1 application topically daily. (Patient not taking: Reported on 08/07/2021) 50 g 0   triamcinolone cream (KENALOG) 0.1 % APPLY 1 APPLICATION TOPICALLY TWICE A DAY (Patient not taking: Reported on 08/07/2021) 30 g 0   Current Facility-Administered Medications  Medication Dose Route Frequency Provider Last Rate Last Admin   0.9 %  sodium chloride infusion   Intravenous Once Eulogio Bear, NP       Facility-Administered Medications Ordered in Other Visits  Medication Dose Route Frequency Provider Last Rate Last Admin   heparin lock flush 100 unit/mL  500 Units Intravenous Once Lloyd Huger, MD        OBJECTIVE: Vitals:   10/02/21 0914  BP: (!) 93/42  Pulse: 66  Resp: 18  Temp: 97.7 F (36.5 C)  SpO2: 99%      There is no height or weight on file to calculate BMI.    ECOG FS:2 - Symptomatic, <50% confined to bed  General: Well-developed, well-nourished, no acute distress.  Sitting in a wheelchair. Eyes: Pink conjunctiva, anicteric sclera. HEENT: Normocephalic, moist mucous membranes. Lungs: No audible wheezing or coughing. Heart:  Regular rate and rhythm. Abdomen: Soft, nontender, no obvious distention. Musculoskeletal: No edema, cyanosis, or clubbing. Neuro: Alert, answering all questions appropriately. Cranial nerves grossly intact. Skin: No rashes or petechiae noted. Psych: Normal affect.   LAB RESULTS:  Lab Results  Component Value Date   NA 134 (L) 10/02/2021   K 3.7 10/02/2021   CL 103 10/02/2021   CO2 29 10/02/2021   GLUCOSE 98 10/02/2021   BUN 21 10/02/2021   CREATININE 0.90 10/02/2021   CALCIUM 8.3 (L) 10/02/2021   PROT 9.2 (H) 10/02/2021   ALBUMIN 2.5 (L) 10/02/2021   AST 22 10/02/2021   ALT 8 10/02/2021   ALKPHOS 25 (L) 10/02/2021   BILITOT 0.3 10/02/2021   GFRNONAA >60 10/02/2021   GFRAA >60 10/13/2019    Lab Results  Component Value Date   WBC 4.6 10/02/2021   NEUTROABS 3.1 10/02/2021   HGB 10.3 (L) 10/02/2021   HCT 32.7 (L) 10/02/2021   MCV 93.7 10/02/2021   PLT 148 (L) 10/02/2021     STUDIES: NM Bone Scan Whole Body  Result Date: 09/20/2021 CLINICAL DATA:  Multiple myeloma without remission; history of multiple falls, including LEFT clavicular fracture 08/18/2021 EXAM: NUCLEAR MEDICINE WHOLE BODY BONE SCAN TECHNIQUE: Whole body anterior and posterior images were obtained approximately 3 hours after intravenous injection of radiopharmaceutical. RADIOPHARMACEUTICALS:  21.35 mCi Technetium-68mMDP IV COMPARISON:  LEFT shoulder and hip radiographs 08/18/2021 FINDINGS: Significant increased uptake mid LEFT clavicle at site of recent fracture. Multiple additional sites of abnormal tracer uptake including pelvis, LEFT femur, and thoracic spine consistent with osseous metastases. Uptake at BILATERAL ribs, could be metastatic or secondary to fractures in setting of multiple falls; LEFT shoulder radiographs demonstrate fractures of the lateral LEFT fourth and fifth ribs, fourth rib fracture healing with callus. Degenerative type uptake of tracer in thoracolumbar spine with uptake at L4,  corresponding to known compression fracture. Degenerative type uptake of tracer at the sternoclavicular joints and BILATERAL hips RIGHT greater than LEFT. Retained tracer in urinary bladder. Otherwise expected urinary tract and soft tissue distribution of tracer. IMPRESSION: Multiple sites of abnormal tracer uptake consistent with osseous metastases. Uptake at known fractures LEFT clavicle and L4 vertebral body. Additional sites of  uptake in multiple ribs which could represent a combination of fractures and or metastatic lesions. Electronically Signed   By: Lavonia Dana M.D.   On: 09/20/2021 10:33    ASSESSMENT: Multiple myeloma  PLAN:  Multiple myeloma: Please see note from September 17, 2020 for historic details of his myeloma and treatment.  Since reinitiating daratumumab, patient's M spike trended down to 3.6, but recent IgG has increased back up to 5498.  Today's M spike is pending.  Kappa free light chains are stable at 177.5.  We once again discussed discontinuing treatment and entering hospice, but patient has declined.  He expressed understanding that given his performance status more aggressive treatment is not recommended.  Previously, patient was receiving daratumumab every 4 weeks and Zometa every 8 weeks, but patient requested a chemotherapy holiday and did not receive any treatment between January 15, 2021 and July 10, 2021.  Proceed with daratumumab and Zometa today.  Return to clinic in 4 weeks for further evaluation and discussion of whether or not to continue treatment.  Appreciate palliative care input.   Thrombocytopenia: Chronic and unchanged.  Patient's platelet count is 148.   Back/joint pain: Chronic and unchanged.  MRI results reviewed independently and report as above consistent with progressive myeloma.  No pathologic fractures reported.  Patient has completed palliative XRT.  Continue current narcotics as prescribed.    Peripheral neuropathy: Chronic and unchanged.  Continue  gabapentin as prescribed. Insomnia/anxiety: Chronic and unchanged.  Patient is now having his medications adjusted by primary care.   Fractured left clavicle: Follow-up with orthopedics as indicated.   Patient expressed understanding and was in agreement with this plan. He also understands that He can call clinic at any time with any questions, concerns, or complaints.    Cancer Staging  No matching staging information was found for the patient.  Lloyd Huger, MD   10/04/2021 12:25 PM

## 2021-10-02 ENCOUNTER — Inpatient Hospital Stay (HOSPITAL_BASED_OUTPATIENT_CLINIC_OR_DEPARTMENT_OTHER): Payer: Medicare Other | Admitting: Hospice and Palliative Medicine

## 2021-10-02 ENCOUNTER — Encounter: Payer: Self-pay | Admitting: Oncology

## 2021-10-02 ENCOUNTER — Inpatient Hospital Stay: Payer: Medicare Other

## 2021-10-02 ENCOUNTER — Inpatient Hospital Stay: Payer: Medicare Other | Admitting: Occupational Therapy

## 2021-10-02 ENCOUNTER — Inpatient Hospital Stay (HOSPITAL_BASED_OUTPATIENT_CLINIC_OR_DEPARTMENT_OTHER): Payer: Medicare Other | Admitting: Oncology

## 2021-10-02 VITALS — BP 93/42 | HR 66 | Temp 97.7°F | Resp 18

## 2021-10-02 VITALS — BP 96/46 | HR 54 | Temp 97.0°F | Resp 18

## 2021-10-02 DIAGNOSIS — C9000 Multiple myeloma not having achieved remission: Secondary | ICD-10-CM

## 2021-10-02 DIAGNOSIS — Z79899 Other long term (current) drug therapy: Secondary | ICD-10-CM | POA: Diagnosis not present

## 2021-10-02 DIAGNOSIS — Z5112 Encounter for antineoplastic immunotherapy: Secondary | ICD-10-CM | POA: Diagnosis not present

## 2021-10-02 DIAGNOSIS — M6281 Muscle weakness (generalized): Secondary | ICD-10-CM | POA: Diagnosis not present

## 2021-10-02 LAB — COMPREHENSIVE METABOLIC PANEL
ALT: 8 U/L (ref 0–44)
AST: 22 U/L (ref 15–41)
Albumin: 2.5 g/dL — ABNORMAL LOW (ref 3.5–5.0)
Alkaline Phosphatase: 25 U/L — ABNORMAL LOW (ref 38–126)
Anion gap: 2 — ABNORMAL LOW (ref 5–15)
BUN: 21 mg/dL (ref 8–23)
CO2: 29 mmol/L (ref 22–32)
Calcium: 8.3 mg/dL — ABNORMAL LOW (ref 8.9–10.3)
Chloride: 103 mmol/L (ref 98–111)
Creatinine, Ser: 0.9 mg/dL (ref 0.61–1.24)
GFR, Estimated: >60 mL/min (ref >60)
Glucose, Bld: 98 mg/dL (ref 70–99)
Potassium: 3.7 mmol/L (ref 3.5–5.1)
Sodium: 134 mmol/L — ABNORMAL LOW (ref 135–145)
Total Bilirubin: 0.3 mg/dL (ref 0.3–1.2)
Total Protein: 9.2 g/dL — ABNORMAL HIGH (ref 6.5–8.1)

## 2021-10-02 LAB — CBC WITH DIFFERENTIAL/PLATELET
Abs Immature Granulocytes: 0.01 10*3/uL (ref 0.00–0.07)
Basophils Absolute: 0 10*3/uL (ref 0.0–0.1)
Basophils Relative: 0 %
Eosinophils Absolute: 0 10*3/uL (ref 0.0–0.5)
Eosinophils Relative: 0 %
HCT: 32.7 % — ABNORMAL LOW (ref 39.0–52.0)
Hemoglobin: 10.3 g/dL — ABNORMAL LOW (ref 13.0–17.0)
Immature Granulocytes: 0 %
Lymphocytes Relative: 23 %
Lymphs Abs: 1 10*3/uL (ref 0.7–4.0)
MCH: 29.5 pg (ref 26.0–34.0)
MCHC: 31.5 g/dL (ref 30.0–36.0)
MCV: 93.7 fL (ref 80.0–100.0)
Monocytes Absolute: 0.5 10*3/uL (ref 0.1–1.0)
Monocytes Relative: 10 %
Neutro Abs: 3.1 10*3/uL (ref 1.7–7.7)
Neutrophils Relative %: 67 %
Platelets: 148 10*3/uL — ABNORMAL LOW (ref 150–400)
RBC: 3.49 MIL/uL — ABNORMAL LOW (ref 4.22–5.81)
RDW: 15.3 % (ref 11.5–15.5)
WBC: 4.6 10*3/uL (ref 4.0–10.5)
nRBC: 0 % (ref 0.0–0.2)

## 2021-10-02 LAB — MAGNESIUM: Magnesium: 2 mg/dL (ref 1.7–2.4)

## 2021-10-02 MED ORDER — HEPARIN SOD (PORK) LOCK FLUSH 100 UNIT/ML IV SOLN
INTRAVENOUS | Status: AC
  Administered 2021-10-02: 500 [IU]
  Filled 2021-10-02: qty 5

## 2021-10-02 MED ORDER — SODIUM CHLORIDE 0.9 % IV SOLN
Freq: Once | INTRAVENOUS | Status: AC
  Filled 2021-10-02: qty 250

## 2021-10-02 MED ORDER — DARATUMUMAB-HYALURONIDASE-FIHJ 1800-30000 MG-UT/15ML ~~LOC~~ SOLN
1800.0000 mg | Freq: Once | SUBCUTANEOUS | Status: AC
  Administered 2021-10-02: 1800 mg via SUBCUTANEOUS
  Filled 2021-10-02: qty 15

## 2021-10-02 MED ORDER — DIPHENHYDRAMINE HCL 25 MG PO CAPS
25.0000 mg | ORAL_CAPSULE | Freq: Once | ORAL | Status: AC
  Administered 2021-10-02: 25 mg via ORAL
  Filled 2021-10-02: qty 1

## 2021-10-02 MED ORDER — ACETAMINOPHEN 325 MG PO TABS
650.0000 mg | ORAL_TABLET | Freq: Once | ORAL | Status: AC
  Administered 2021-10-02: 650 mg via ORAL
  Filled 2021-10-02: qty 2

## 2021-10-02 MED ORDER — PROCHLORPERAZINE MALEATE 10 MG PO TABS
10.0000 mg | ORAL_TABLET | Freq: Once | ORAL | Status: AC
  Administered 2021-10-02: 10 mg via ORAL
  Filled 2021-10-02: qty 1

## 2021-10-02 MED ORDER — DEXAMETHASONE 4 MG PO TABS
20.0000 mg | ORAL_TABLET | Freq: Once | ORAL | Status: AC
  Administered 2021-10-02: 20 mg via ORAL
  Filled 2021-10-02: qty 5

## 2021-10-02 MED ORDER — DEXAMETHASONE 4 MG PO TABS: 4.0000 mg | ORAL_TABLET | Freq: Every day | ORAL | 0 refills | Status: DC

## 2021-10-02 MED ORDER — ZOLEDRONIC ACID 4 MG/100ML IV SOLN
4.0000 mg | Freq: Once | INTRAVENOUS | Status: AC
  Administered 2021-10-02: 4 mg via INTRAVENOUS
  Filled 2021-10-02: qty 100

## 2021-10-02 NOTE — Patient Instructions (Addendum)
[facility] AT [place]  Discharge Instructions: Thank you for choosing [facility] to provide your oncology and hematology care.  If you have a lab appointment with the [facility], please go directly to the [facility] and check in at the registration area.  Wear comfortable clothing and clothing appropriate for easy access to any Portacath or PICC line.   We strive to give you quality time with your provider. You may need to reschedule your appointment if you arrive late (15 or more minutes).  Arriving late affects you and other patients whose appointments are after yours.  Also, if you miss three or more appointments without notifying the office, you may be dismissed from the clinic at the provider's discretion.      For prescription refill requests, have your pharmacy contact our office and allow 72 hours for refills to be completed.    Today you received the following chemotherapy and/or immunotherapy agents Darzalex Faspro      To help prevent nausea and vomiting after your treatment, we encourage you to take your nausea medication as directed.  BELOW ARE SYMPTOMS THAT SHOULD BE REPORTED IMMEDIATELY: *FEVER GREATER THAN 100.4 F (38 C) OR HIGHER *CHILLS OR SWEATING *NAUSEA AND VOMITING THAT IS NOT CONTROLLED WITH YOUR NAUSEA MEDICATION *UNUSUAL SHORTNESS OF BREATH *UNUSUAL BRUISING OR BLEEDING *URINARY PROBLEMS (pain or burning when urinating, or frequent urination) *BOWEL PROBLEMS (unusual diarrhea, constipation, pain near the anus) TENDERNESS IN MOUTH AND THROAT WITH OR WITHOUT PRESENCE OF ULCERS (sore throat, sores in mouth, or a toothache) UNUSUAL RASH, SWELLING OR PAIN  UNUSUAL VAGINAL DISCHARGE OR ITCHING   Items with * indicate a potential emergency and should be followed up as soon as possible or go to the Emergency Department if any problems should occur.  Please show the CHEMOTHERAPY ALERT CARD or IMMUNOTHERAPY ALERT CARD at  check-in to the Emergency Department and triage nurse.  Should you have questions after your visit or need to cancel or reschedule your appointment, please contact [facility] [place] AT [place]  [phone] and follow the prompts.  Office hours are 8:00 a.m. to 4:30 p.m. Monday - Friday. Please note that voicemails left after 4:00 p.m. may not be returned until the following business day.  We are closed weekends and major holidays. You have access to a nurse at all times for urgent questions. Please call the main number to the clinic [phone] and follow the prompts.  For any non-urgent questions, you may also contact your provider using MyChart. We now offer e-Visits for anyone 64 and older to request care online for non-urgent symptoms. For details visit [email or web address].   Also download the MyChart app! Go to the app store, search "MyChart", open the app, select [facility], and log in with your MyChart username and password.  Masks are optional in the cancer centers. If you would like for your care team to wear a mask while they are taking care of you, please let them know. For doctor visits, patients may have with them one support person who is at least 84 years old. At this time, visitors are not allowed in the infusion area.   Zoledronic Acid Injection (Cancer) What is this medication? ZOLEDRONIC ACID (ZOE le dron ik AS id) treats high calcium levels in the blood caused by cancer. It may also be used with chemotherapy to treat weakened bones caused by cancer. It works by slowing down the release of calcium from bones. This lowers calcium  levels in your blood. It also makes your bones stronger and less likely to break (fracture). It belongs to a group of medications called bisphosphonates. This medicine may be used for other purposes; ask your health care provider or pharmacist if you have questions. COMMON BRAND NAME(S): Zometa, Zometa Powder What should I tell my  care team before I take this medication? They need to know if you have any of these conditions: Dehydration Dental disease Kidney disease Liver disease Low levels of calcium in the blood Lung or breathing disease, such as asthma Receiving steroids, such as dexamethasone or prednisone An unusual or allergic reaction to zoledronic acid, other medications, foods, dyes, or preservatives Pregnant or trying to get pregnant Breast-feeding How should I use this medication? This medication is injected into a vein. It is given by your care team in a hospital or clinic setting. Talk to your care team about the use of this medication in children. Special care may be needed. Overdosage: If you think you have taken too much of this medicine contact a poison control center or emergency room at once. NOTE: This medicine is only for you. Do not share this medicine with others. What if I miss a dose? Keep appointments for follow-up doses. It is important not to miss your dose. Call your care team if you are unable to keep an appointment. What may interact with this medication? Certain antibiotics given by injection Diuretics, such as bumetanide, furosemide NSAIDs, medications for pain and inflammation, such as ibuprofen or naproxen Teriparatide Thalidomide This list may not describe all possible interactions. Give your health care provider a list of all the medicines, herbs, non-prescription drugs, or dietary supplements you use. Also tell them if you smoke, drink alcohol, or use illegal drugs. Some items may interact with your medicine. What should I watch for while using this medication? Visit your care team for regular checks on your progress. It may be some time before you see the benefit from this medication. Some people who take this medication have severe bone, joint, or muscle pain. This medication may also increase your risk for jaw problems or a broken thigh bone. Tell your care team right away  if you have severe pain in your jaw, bones, joints, or muscles. Tell you care team if you have any pain that does not go away or that gets worse. Tell your dentist and dental surgeon that you are taking this medication. You should not have major dental surgery while on this medication. See your dentist to have a dental exam and fix any dental problems before starting this medication. Take good care of your teeth while on this medication. Make sure you see your dentist for regular follow-up appointments. You should make sure you get enough calcium and vitamin D while you are taking this medication. Discuss the foods you eat and the vitamins you take with your care team. Check with your care team if you have severe diarrhea, nausea, and vomiting, or if you sweat a lot. The loss of too much body fluid may make it dangerous for you to take this medication. You may need bloodwork while taking this medication. Talk to your care team if you wish to become pregnant or think you might be pregnant. This medication can cause serious birth defects. What side effects may I notice from receiving this medication? Side effects that you should report to your care team as soon as possible: Allergic reactions--skin rash, itching, hives, swelling of the face, lips, tongue,  or throat Kidney injury--decrease in the amount of urine, swelling of the ankles, hands, or feet Low calcium level--muscle pain or cramps, confusion, tingling, or numbness in the hands or feet Osteonecrosis of the jaw--pain, swelling, or redness in the mouth, numbness of the jaw, poor healing after dental work, unusual discharge from the mouth, visible bones in the mouth Severe bone, joint, or muscle pain Side effects that usually do not require medical attention (report to your care team if they continue or are bothersome): Constipation Fatigue Fever Loss of appetite Nausea Stomach pain This list may not describe all possible side effects. Call  your doctor for medical advice about side effects. You may report side effects to FDA at 1-800-FDA-1088. Where should I keep my medication? This medication is given in a hospital or clinic. It will not be stored at home. NOTE: This sheet is a summary. It may not cover all possible information. If you have questions about this medicine, talk to your doctor, pharmacist, or health care provider.  2023 Elsevier/Gold Standard (2021-03-07 00:00:00)

## 2021-10-02 NOTE — Progress Notes (Signed)
Nutrition Follow-up:  Patient with multiple myeloma.  Patient receiving daratumumab.    Met with patient and daughter in infusion.  Patient continues with poor appetite.  Daughter preparing meals for him.  Drinking carnation breakfast essentials 2 times per day.    MD starting dexamethasone today    Medications: reviewed  Labs: reviewed  Anthropometrics:   Weight not take today because can't stand  168 lb on 7/5 178 lb on 5/25 173 lb on 3/22 183 lb on 2/7 176 lb on 1/11   NUTRITION DIAGNOSIS: Inadequate oral intake continues   INTERVENTION:  Continue eating high calorie, high protein foods Agree with adding dexamethasone  Continue oral nutrition supplement   MONITORING, EVALUATION, GOAL: weight trends, intake   NEXT VISIT: Wednesday, Sept 27 during infusion  Merilee Wible B. Zenia Resides, [place], [place] Registered Dietitian [phone]

## 2021-10-02 NOTE — Progress Notes (Signed)
ON PATHWAY REGIMEN - Multiple Myeloma and Other Plasma Cell Dyscrasias  No Change  Continue With Treatment as Ordered.  Original Decision Date/Time: 09/09/2017 10:25     A cycle is every 28 days:     Pomalidomide      Dexamethasone      Daratumumab      Dexamethasone      Dexamethasone      Daratumumab      Dexamethasone      Dexamethasone      Daratumumab   **Always confirm dose/schedule in your pharmacy ordering system**  Patient Characteristics: Relapsed / Refractory, All Lines of Therapy R-ISS Staging: Unknown Disease Classification: Relapsed Line of Therapy: Second Line Intent of Therapy: Non-Curative / Palliative Intent, Discussed with Patient
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
## 2021-10-02 NOTE — Progress Notes (Signed)
Weak, slipped out of chair recently onto right side

## 2021-10-03 ENCOUNTER — Other Ambulatory Visit: Payer: Self-pay | Admitting: Oncology

## 2021-10-03 DIAGNOSIS — C9000 Multiple myeloma not having achieved remission: Secondary | ICD-10-CM

## 2021-10-03 DIAGNOSIS — G629 Polyneuropathy, unspecified: Secondary | ICD-10-CM

## 2021-10-03 LAB — KAPPA/LAMBDA LIGHT CHAINS
Kappa free light chain: 177.5 mg/L — ABNORMAL HIGH (ref 3.3–19.4)
Kappa, lambda light chain ratio: 93.42 — ABNORMAL HIGH (ref 0.26–1.65)
Lambda free light chains: 1.9 mg/L — ABNORMAL LOW (ref 5.7–26.3)

## 2021-10-03 LAB — IGG, IGA, IGM
IgA: 7 mg/dL — ABNORMAL LOW (ref 61–437)
IgG (Immunoglobin G), Serum: 5498 mg/dL — ABNORMAL HIGH (ref 603–1613)
IgM (Immunoglobulin M), Srm: 8 mg/dL — ABNORMAL LOW (ref 15–143)

## 2021-10-04 ENCOUNTER — Encounter: Payer: Self-pay | Admitting: Oncology

## 2021-10-04 ENCOUNTER — Encounter (HOSPITAL_COMMUNITY): Payer: Self-pay | Admitting: Oncology

## 2021-10-08 LAB — PROTEIN ELECTROPHORESIS, SERUM
A/G Ratio: 0.6 — ABNORMAL LOW (ref 0.7–1.7)
Albumin ELP: 3.3 g/dL (ref 2.9–4.4)
Alpha-1-Globulin: 0.3 g/dL (ref 0.0–0.4)
Alpha-2-Globulin: 0.7 g/dL (ref 0.4–1.0)
Beta Globulin: 0.7 g/dL (ref 0.7–1.3)
Gamma Globulin: 4.1 g/dL — ABNORMAL HIGH (ref 0.4–1.8)
Globulin, Total: 5.8 g/dL — ABNORMAL HIGH (ref 2.2–3.9)
M-Spike, %: 3.9 g/dL — ABNORMAL HIGH
Total Protein ELP: 9.1 g/dL — ABNORMAL HIGH (ref 6.0–8.5)

## 2021-10-10 ENCOUNTER — Other Ambulatory Visit: Payer: Self-pay | Admitting: Oncology

## 2021-10-10 DIAGNOSIS — C9000 Multiple myeloma not having achieved remission: Secondary | ICD-10-CM

## 2021-10-16 ENCOUNTER — Other Ambulatory Visit: Payer: Self-pay | Admitting: Oncology

## 2021-10-16 DIAGNOSIS — Z8579 Personal history of other malignant neoplasms of lymphoid, hematopoietic and related tissues: Secondary | ICD-10-CM | POA: Diagnosis not present

## 2021-10-16 DIAGNOSIS — S42002A Fracture of unspecified part of left clavicle, initial encounter for closed fracture: Secondary | ICD-10-CM | POA: Diagnosis not present

## 2021-10-16 DIAGNOSIS — M79602 Pain in left arm: Secondary | ICD-10-CM | POA: Insufficient documentation

## 2021-10-16 DIAGNOSIS — C9000 Multiple myeloma not having achieved remission: Secondary | ICD-10-CM

## 2021-10-21 ENCOUNTER — Other Ambulatory Visit: Payer: Self-pay | Admitting: *Deleted

## 2021-10-21 ENCOUNTER — Ambulatory Visit
Admission: RE | Admit: 2021-10-21 | Discharge: 2021-10-21 | Disposition: A | Discharge status: Home / Self Care | Payer: Medicare Other | Source: Ambulatory Visit | Attending: Radiation Oncology | Admitting: Radiation Oncology

## 2021-10-21 VITALS — BP 131/46 | HR 80 | Temp 98.2°F | Resp 17 | Wt 159.0 lb

## 2021-10-21 DIAGNOSIS — C9000 Multiple myeloma not having achieved remission: Secondary | ICD-10-CM

## 2021-10-21 DIAGNOSIS — G4700 Insomnia, unspecified: Secondary | ICD-10-CM | POA: Diagnosis not present

## 2021-10-21 DIAGNOSIS — Z923 Personal history of irradiation: Secondary | ICD-10-CM | POA: Diagnosis not present

## 2021-10-21 MED ORDER — DEXAMETHASONE 4 MG PO TABS: 4.0000 mg | ORAL_TABLET | Freq: Every day | ORAL | 0 refills | Status: DC

## 2021-10-21 NOTE — Telephone Encounter (Signed)
I just got a return call from [place] at [place] who said that daughter Dean [name] picked up Oxycodone on 10/15/21 @ 938 AM at the drive through window when she got his Gabapentin. Call returned to [facility] and informed of this and she said she will look for it stating, "I'm sure it is here then" She states that he still needs the Dex.

## 2021-10-21 NOTE — Telephone Encounter (Signed)
Per daughter, pharmacy states they do not have prescription form 9/7, I have called pharmacy and got voice mail and left message to please return my call regarding this matter.  Daughter also states that the Dexamethasone has really helped with patient pain level

## 2021-10-21 NOTE — Progress Notes (Signed)
Radiation Oncology Follow up Note  Name: Dean [name].   Date:   10/21/2021 MRN:  [id number] DOB: December 06, 1935    This 86 y.o. male presents to the clinic today for 1 month follow-up status post palliative radiation therapy to his left hip.  REFERRING PROVIDER: [name], MD  HPI: Patient is an 86 year old male with multiple myeloma 1 month out from a single fraction palliative treatment to his left hip for involvement of multiple myeloma.  Has multiple other painful sites.  Nothing is increasing in pain at this time.Marland Kitchen  He has evidence of involvement of left clavicle as well as L L4 vertebral body.  He is currently undergoing treatment in medical oncology with dexamethasone as well asDaratumumab.  COMPLICATIONS OF TREATMENT: none  FOLLOW UP COMPLIANCE: keeps appointments   PHYSICAL EXAM:  BP (!) 131/46 (Patient Position: Sitting)   Pulse 80   Temp 98.2 F (36.8 C) (Oral)   Resp 17   Wt 159 lb (72.1 kg)   SpO2 99%   BMI 24.18 kg/m  Frail-appearing male in moderate distress pain.  Range of motion his lower extremities does not elicit pain.  Motor and sensory levels are equal and symmetric in lower extremities.  Palpation of the spine does not elicit pain.  Well-developed well-nourished patient in NAD. HEENT reveals PERLA, EOMI, discs not visualized.  Oral cavity is clear. No oral mucosal lesions are identified. Neck is clear without evidence of cervical or supraclavicular adenopathy. Lungs are clear to A&P. Cardiac examination is essentially unremarkable with regular rate and rhythm without murmur rub or thrill. Abdomen is benign with no organomegaly or masses noted. Motor sensory and DTR levels are equal and symmetric in the upper and lower extremities. Cranial nerves II through XII are grossly intact. Proprioception is intact. No peripheral adenopathy or edema is identified. No motor or sensory levels are noted. Crude visual fields are within normal range.  RADIOLOGY RESULTS: No  current films for review  PLAN: This time I am turning follow-up care over to medical oncology or be happy to reevaluate him at any time should further palliative treatment be indicated.  He is currently undergoing treatment.  He is having some problems with sleep of asked him to address that with the palliative team.  Patient is to call with any concerns in the future.  I would like to take this opportunity to thank you for allowing me to participate in the care of your patient.Noreene Filbert, MD

## 2021-10-25 NOTE — Progress Notes (Signed)
Dean [name]  Telephone:([phone] [phone]  ID: [name]. OB: 1935/09/24  MR#: [id number]  FYB#:[id number]  Patient Care Team: [name], MD as PCP - General (Family Medicine) Gery Pray, MD as Consulting Physician (Radiation Oncology) Danie Binder, MD (Inactive) as Consulting Physician (Gastroenterology) Michael Boston, MD as Consulting Physician (General Surgery) Lloyd Huger, MD as Consulting Physician (Oncology)  CHIEF COMPLAINT: Multiple myeloma  INTERVAL HISTORY: Patient returns to clinic today for repeat laboratory work, further evaluation, and continuation of daratumumab and Zometa.  He continues to have frequent falls, but his performance status has mildly improved and he has gained several pounds in the interim.  He has chronic weakness and fatigue.  He continues to have bilateral hip pain.  He has no neurologic complaints.  He denies any recent fevers or illnesses.  He has a poor appetite.  He has no chest pain, shortness of breath, cough, or hemoptysis.  He denies any nausea, vomiting, constipation, or diarrhea.  He has no urinary complaints.  Patient offers no further specific complaints today.  REVIEW OF SYSTEMS:   Review of Systems  Constitutional:  Positive for malaise/fatigue. Negative for fever and weight loss.  Respiratory: Negative.  Negative for cough, hemoptysis and shortness of breath.   Cardiovascular: Negative.  Negative for chest pain and leg swelling.  Gastrointestinal: Negative.  Negative for abdominal pain and nausea.  Genitourinary: Negative.  Negative for dysuria.  Musculoskeletal:  Positive for back pain, falls and joint pain.  Skin: Negative.  Negative for rash.  Neurological:  Positive for sensory change and weakness. Negative for dizziness, focal weakness and headaches.  Psychiatric/Behavioral: Negative.  The patient is not nervous/anxious and does not have insomnia.     As per HPI. Otherwise,  a complete review of systems is negative.  PAST MEDICAL HISTORY: Past Medical History:  Diagnosis Date   BPH (benign prostatic hyperplasia)    Colonic diverticular abscess 01/08/2015   Diverticulitis 52/77/82   complicated by abscess and required percutaneous drainage   GERD (gastroesophageal reflux disease)    H/O ETOH abuse    History of chemotherapy last done jan 2017   History of radiation therapy 01/05/13-02/10/13   45 gray to left occipital condyle region   History of radiation therapy 12/01/16-12/10/16   Parasternal nodule, chest- 24 Gy total delivered in 8 fractions, Left sacro-iliac, pelvis- 24 Gy total delivered in 8 fractions    History of radiation therapy 02/19/17-02/22/17   right temporal scalp 30 Gy in 10 fractions   Intra-abdominal abscess (HCC)    Multiple myeloma ([place]) 2015   Neuropathy    Radiation 02/21/14-03/08/14   right posterior chest wall area 30 gray   Radiation 04/19/14-05/02/14   lumbar spine 25 gray   Skull lesion    Left occipital condyle   Wrist fracture, left    x 2    PAST SURGICAL HISTORY: Past Surgical History:  Procedure Laterality Date   COLONOSCOPY N/A 04/19/2015   Procedure: COLONOSCOPY;  Surgeon: Danie Binder, MD;  Location: [facility];  Service: Endoscopy;  Laterality: N/A;  1:30 PM   CYST EXCISION  1959   tail bone   IR IMAGING GUIDED PORT INSERTION  09/25/2017    FAMILY HISTORY: Family History  Problem Relation Age of Onset   Diabetes Mother    Stroke Mother    Hypertension Father     ADVANCED DIRECTIVES (Y/N):  N  HEALTH MAINTENANCE: Social History   Tobacco Use  Smoking status: Never   Smokeless tobacco: Never  Vaping Use   Vaping Use: Never used  Substance Use Topics   Alcohol use: No    Comment: " Not much no more"   Drug use: No     Colonoscopy:  PAP:  Bone density:  Lipid panel:  Allergies  Allergen Reactions   Codeine Other (See Comments)    Headache   Morphine And Related Other (See Comments)     Makes him feel weird   Revlimid [Lenalidomide] Other (See Comments)    "Causes me to become weak"    Current Outpatient Medications  Medication Sig Dispense Refill   acyclovir (ZOVIRAX) 400 MG tablet TAKE 1 TABLET BY MOUTH TWICE A DAY 60 tablet 6   ALPRAZolam (XANAX) 1 MG tablet TAKE 1 TABLET BY MOUTH 3 TIMES DAILY AS NEEDED FOR ANXIETY 90 tablet 0   CALCIUM-VITAMIN D PO Take 1 tablet by mouth 2 (two) times daily.      cephALEXin (KEFLEX) 500 MG capsule Take 1 capsule (500 mg total) by mouth 3 (three) times daily. 42 capsule 0   cetirizine (ZYRTEC) 10 MG tablet Take 1 tablet (10 mg total) by mouth daily. 30 tablet 0   dexamethasone (DECADRON) 4 MG tablet      dexamethasone (DECADRON) 4 MG tablet Take 1 tablet (4 mg total) by mouth daily. 15 tablet 0   fluticasone (FLONASE) 50 MCG/ACT nasal spray Place 2 sprays into both nostrils daily. 16 g 6   furosemide (LASIX) 20 MG tablet TAKE 1 TABLET BY MOUTH ONCE A DAY AS NEEDED 30 tablet 3   gabapentin (NEURONTIN) 400 MG capsule TAKE 1 CAPSULE BY MOUTH 3 TIMES A DAY 90 capsule 4   Homeopathic Products (THERAWORX RELIEF EX) Apply topically as needed.     ibuprofen (ADVIL) 600 MG tablet Take 1 tablet (600 mg total) by mouth every 8 (eight) hours as needed. 30 tablet 0   lidocaine-prilocaine (EMLA) cream Apply small amount over port one (1) hour prior to appointment. 30 g 0   montelukast (SINGULAIR) 10 MG tablet TAKE 1 TABLET BY MOUTH EVERY NIGHT AT BEDTIME 30 tablet 3   naloxone (NARCAN) nasal spray 4 mg/0.1 mL SPRAY 1 SPRAY INTO ONE NOSTRIL AS DIRECTED FOR OPIOID OVERDOSE (TURN PERSON ON SIDE AFTER DOSE. IF NO RESPONSE IN 2-3 MINUTES OR PERSON RESPONDS BUT RELAPSES, REPEAT USING A NEW SPRAY DEVICE AND SPRAY INTO THE OTHER NOSTRIL. CALL 911 AFTER USE.) * EMERGENCY USE ONLY * 1 each 0   omeprazole (PRILOSEC) 20 MG capsule TAKE 1 CAPSULE BY MOUTH ONCE DAILY 30 capsule 5   Oxycodone HCl 10 MG TABS TAKE 1 TO 2 TABLETS BY MOUTH 3 TIMES DAILY AS NEEDED 90  tablet 0   senna-docusate (SENOKOT-S) 8.6-50 MG tablet Take 1 tablet by mouth 2 (two) times daily. 60 tablet 5   tamsulosin (FLOMAX) 0.4 MG CAPS capsule TAKE ONE CAPSULE BY MOUTH EVERY NIGHT ATBEDTIME 30 capsule 11   traZODone (DESYREL) 100 MG tablet TAKE 3 TABLETS BY MOUTH AT BEDTIME 90 tablet 1   TURMERIC PO Take by mouth.     zolendronic acid (ZOMETA) 4 MG/5ML injection Inject 4 mg into the vein every 8 (eight) weeks.     oxybutynin (DITROPAN-XL) 10 MG 24 hr tablet TAKE 1 TABLET BY MOUTH EVERY NIGHT AT BEDTIME (Patient not taking: Reported on 08/07/2021) 90 tablet 0   predniSONE (DELTASONE) 20 MG tablet Take 2 tablets (40 mg total) by mouth daily with breakfast. (Patient  not taking: Reported on 06/27/2021) 10 tablet 0   silver sulfADIAZINE (SILVADENE) 1 % cream Apply 1 application topically daily. (Patient not taking: Reported on 08/07/2021) 50 g 0   triamcinolone cream (KENALOG) 0.1 % APPLY 1 APPLICATION TOPICALLY TWICE A DAY (Patient not taking: Reported on 08/07/2021) 30 g 0   Current Facility-Administered Medications  Medication Dose Route Frequency Provider Last Rate Last Admin   0.9 %  sodium chloride infusion   Intravenous Once Eulogio Bear, NP       Facility-Administered Medications Ordered in Other Visits  Medication Dose Route Frequency Provider Last Rate Last Admin   heparin lock flush 100 unit/mL  500 Units Intravenous Once Lloyd Huger, MD        OBJECTIVE: Vitals:   10/30/21 1101  BP: (!) 126/91  Pulse: 69  Temp: (!) 97.4 F (36.3 C)      Body mass index is 24.63 kg/m.    ECOG FS:2 - Symptomatic, <50% confined to bed  General: Well-developed, well-nourished, no acute distress.  Sitting in a wheelchair. Eyes: Pink conjunctiva, anicteric sclera. HEENT: Normocephalic, moist mucous membranes. Lungs: No audible wheezing or coughing. Heart: Regular rate and rhythm. Abdomen: Soft, nontender, no obvious distention. Musculoskeletal: No edema, cyanosis, or  clubbing. Neuro: Alert, answering all questions appropriately. Cranial nerves grossly intact. Skin: No rashes or petechiae noted. Psych: Normal affect.   LAB RESULTS:  Lab Results  Component Value Date   NA 138 10/30/2021   K 3.2 (L) 10/30/2021   CL 107 10/30/2021   CO2 29 10/30/2021   GLUCOSE 91 10/30/2021   BUN 18 10/30/2021   CREATININE 0.77 10/30/2021   CALCIUM 8.1 (L) 10/30/2021   PROT 9.2 (H) 10/02/2021   ALBUMIN 2.5 (L) 10/02/2021   AST 22 10/02/2021   ALT 8 10/02/2021   ALKPHOS 25 (L) 10/02/2021   BILITOT 0.3 10/02/2021   GFRNONAA >60 10/30/2021   GFRAA >60 10/13/2019    Lab Results  Component Value Date   WBC 5.7 10/30/2021   NEUTROABS 3.3 10/30/2021   HGB 10.8 (L) 10/30/2021   HCT 34.3 (L) 10/30/2021   MCV 93.2 10/30/2021   PLT 165 10/30/2021     STUDIES: No results found.  ASSESSMENT: Multiple myeloma  PLAN:  Multiple myeloma: Please see note from September 17, 2020 for historic details of his myeloma and treatment.  Since reinitiating daratumumab, patient's M spike trended down to 3.6, but recently has increased back to 3.9.  Today's result is pending.  His most recent IgG has also increased to 5498. Kappa free light chains are stable at 177.5.  We once again discussed discontinuing treatment and entering hospice, but patient has declined.  He expressed understanding that given his performance status more aggressive treatment is not recommended.  Previously, patient was receiving daratumumab every 4 weeks and Zometa every 8 weeks, but patient requested a chemotherapy holiday and did not receive any treatment between January 15, 2021 and July 10, 2021.  Proceed with daratumumab and Zometa today.  Return to clinic in 4 weeks for further evaluation, continued hospice discussions, and continuation of treatment if desired.  Appreciate palliative care input.   Thrombocytopenia: Resolved. Back/joint pain: Chronic and unchanged.  MRI results reviewed independently and  report as above consistent with progressive myeloma.  No pathologic fractures reported.  Patient has completed palliative XRT.  Continue current narcotics as prescribed.    Peripheral neuropathy: Chronic and unchanged.  Continue gabapentin as prescribed. Insomnia/anxiety: Chronic and unchanged.  Patient is  now having his medications adjusted by primary care.   Fractured left clavicle: Follow-up with orthopedics as indicated. Anemia: Mild, patient's hemoglobin is 10.8 today.  Monitor Hypocalcemia: Given patient's low albumin this corrects to near normal.  Proceed with Zometa as above.   Patient expressed understanding and was in agreement with this plan. He also understands that He can call clinic at any time with any questions, concerns, or complaints.    Cancer Staging  No matching staging information was found for the patient.  Lloyd Huger, MD   11/01/2021 8:20 AM

## 2021-10-30 ENCOUNTER — Ambulatory Visit: Payer: Medicare Other

## 2021-10-30 ENCOUNTER — Other Ambulatory Visit: Payer: Medicare Other

## 2021-10-30 ENCOUNTER — Inpatient Hospital Stay: Payer: Medicare Other | Attending: Oncology

## 2021-10-30 ENCOUNTER — Inpatient Hospital Stay: Payer: Medicare Other

## 2021-10-30 ENCOUNTER — Inpatient Hospital Stay (HOSPITAL_BASED_OUTPATIENT_CLINIC_OR_DEPARTMENT_OTHER): Payer: Medicare Other | Admitting: Oncology

## 2021-10-30 ENCOUNTER — Encounter: Payer: Self-pay | Admitting: Oncology

## 2021-10-30 ENCOUNTER — Ambulatory Visit: Payer: Medicare Other | Admitting: Oncology

## 2021-10-30 VITALS — BP 126/91 | HR 69 | Temp 97.4°F | Wt 162.0 lb

## 2021-10-30 DIAGNOSIS — C9000 Multiple myeloma not having achieved remission: Secondary | ICD-10-CM

## 2021-10-30 DIAGNOSIS — Z5112 Encounter for antineoplastic immunotherapy: Secondary | ICD-10-CM | POA: Diagnosis not present

## 2021-10-30 DIAGNOSIS — Z79899 Other long term (current) drug therapy: Secondary | ICD-10-CM | POA: Diagnosis not present

## 2021-10-30 LAB — CBC WITH DIFFERENTIAL/PLATELET
Abs Immature Granulocytes: 0.03 10*3/uL (ref 0.00–0.07)
Basophils Absolute: 0 10*3/uL (ref 0.0–0.1)
Basophils Relative: 0 %
Eosinophils Absolute: 0 10*3/uL (ref 0.0–0.5)
Eosinophils Relative: 1 %
HCT: 34.3 % — ABNORMAL LOW (ref 39.0–52.0)
Hemoglobin: 10.8 g/dL — ABNORMAL LOW (ref 13.0–17.0)
Immature Granulocytes: 1 %
Lymphocytes Relative: 33 %
Lymphs Abs: 1.9 10*3/uL (ref 0.7–4.0)
MCH: 29.3 pg (ref 26.0–34.0)
MCHC: 31.5 g/dL (ref 30.0–36.0)
MCV: 93.2 fL (ref 80.0–100.0)
Monocytes Absolute: 0.5 10*3/uL (ref 0.1–1.0)
Monocytes Relative: 8 %
Neutro Abs: 3.3 10*3/uL (ref 1.7–7.7)
Neutrophils Relative %: 57 %
Platelets: 165 10*3/uL (ref 150–400)
RBC: 3.68 MIL/uL — ABNORMAL LOW (ref 4.22–5.81)
RDW: 15.7 % — ABNORMAL HIGH (ref 11.5–15.5)
WBC: 5.7 10*3/uL (ref 4.0–10.5)
nRBC: 0 % (ref 0.0–0.2)

## 2021-10-30 LAB — BASIC METABOLIC PANEL
Anion gap: 2 — ABNORMAL LOW (ref 5–15)
BUN: 18 mg/dL (ref 8–23)
CO2: 29 mmol/L (ref 22–32)
Calcium: 8.1 mg/dL — ABNORMAL LOW (ref 8.9–10.3)
Chloride: 107 mmol/L (ref 98–111)
Creatinine, Ser: 0.77 mg/dL (ref 0.61–1.24)
GFR, Estimated: >60 mL/min (ref >60)
Glucose, Bld: 91 mg/dL (ref 70–99)
Potassium: 3.2 mmol/L — ABNORMAL LOW (ref 3.5–5.1)
Sodium: 138 mmol/L (ref 135–145)

## 2021-10-30 LAB — MAGNESIUM: Magnesium: 2 mg/dL (ref 1.7–2.4)

## 2021-10-30 MED ORDER — ZOLEDRONIC ACID 4 MG/100ML IV SOLN
4.0000 mg | Freq: Once | INTRAVENOUS | Status: AC
  Administered 2021-10-30: 4 mg via INTRAVENOUS
  Filled 2021-10-30: qty 100

## 2021-10-30 MED ORDER — DIPHENHYDRAMINE HCL 25 MG PO CAPS
50.0000 mg | ORAL_CAPSULE | Freq: Once | ORAL | Status: AC
  Administered 2021-10-30: 50 mg via ORAL

## 2021-10-30 MED ORDER — ACETAMINOPHEN 325 MG PO TABS
650.0000 mg | ORAL_TABLET | Freq: Once | ORAL | Status: AC
  Administered 2021-10-30: 650 mg via ORAL
  Filled 2021-10-30: qty 2

## 2021-10-30 MED ORDER — DARATUMUMAB-HYALURONIDASE-FIHJ 1800-30000 MG-UT/15ML ~~LOC~~ SOLN
1800.0000 mg | Freq: Once | SUBCUTANEOUS | Status: AC
  Administered 2021-10-30: 1800 mg via SUBCUTANEOUS
  Filled 2021-10-30: qty 15

## 2021-10-30 MED ORDER — HEPARIN SOD (PORK) LOCK FLUSH 100 UNIT/ML IV SOLN
500.0000 [IU] | Freq: Once | INTRAVENOUS | Status: AC
  Administered 2021-10-30: 500 [IU] via INTRAVENOUS
  Filled 2021-10-30: qty 5

## 2021-10-30 MED ORDER — DEXAMETHASONE 4 MG PO TABS
20.0000 mg | ORAL_TABLET | Freq: Once | ORAL | Status: AC
  Administered 2021-10-30: 20 mg via ORAL
  Filled 2021-10-30: qty 5

## 2021-10-30 MED ORDER — SODIUM CHLORIDE 0.9% FLUSH
10.0000 mL | Freq: Once | INTRAVENOUS | Status: AC
  Administered 2021-10-30: 10 mL via INTRAVENOUS
  Filled 2021-10-30: qty 10

## 2021-10-30 NOTE — Progress Notes (Signed)
Nutrition Follow-up:  Patient with multiple myeloma.  Patient receiving daratumumab.   Met with patient and daughter in infusion.  "I gained some weight today."  Daughter reports that patient has been eating a little bit more.  "I feel better."  Has been drinking 3 carnation breakfast essentials drink each day.   Wife with dementia a current stressor for patient.     Medications: reviewed  Labs: K 3.2  Anthropometrics:   Weight 162 lb today  159 lb on 9/18  168 lb on 7/5 178 lb on 5/25 173 lb on 3/22 183 lb on 2/7 176 lb on 1/11   NUTRITION DIAGNOSIS: Inadequate oral intake continues   INTERVENTION:  Continue high calorie, high protein foods Continue carnation breakfast essentials drink for added nutrition Active listening regarding wife with dementia.     MONITORING, EVALUATION, GOAL: weight trends, intake   NEXT VISIT:  Wednesday, Oct 25 during infusion  Annlee Glandon B. Zenia Resides, [place], [place] Registered Dietitian [phone]

## 2021-10-30 NOTE — Patient Instructions (Signed)
[facility]  Discharge Instructions: Thank you for choosing [facility] to provide your oncology and hematology care.  If you have a lab appointment with the Cancer Center, please go directly to the Cancer Center and check in at the registration area.  Wear comfortable clothing and clothing appropriate for easy access to any Portacath or PICC line.   We strive to give you quality time with your provider. You may need to reschedule your appointment if you arrive late (15 or more minutes).  Arriving late affects you and other patients whose appointments are after yours.  Also, if you miss three or more appointments without notifying the office, you may be dismissed from the clinic at the provider's discretion.      For prescription refill requests, have your pharmacy contact our office and allow 72 hours for refills to be completed.    Today you received the following chemotherapy and/or immunotherapy agents: Darzalex Faspro      To help prevent nausea and vomiting after your treatment, we encourage you to take your nausea medication as directed.  BELOW ARE SYMPTOMS THAT SHOULD BE REPORTED IMMEDIATELY: *FEVER GREATER THAN 100.4 F (38 C) OR HIGHER *CHILLS OR SWEATING *NAUSEA AND VOMITING THAT IS NOT CONTROLLED WITH YOUR NAUSEA MEDICATION *UNUSUAL SHORTNESS OF BREATH *UNUSUAL BRUISING OR BLEEDING *URINARY PROBLEMS (pain or burning when urinating, or frequent urination) *BOWEL PROBLEMS (unusual diarrhea, constipation, pain near the anus) TENDERNESS IN MOUTH AND THROAT WITH OR WITHOUT PRESENCE OF ULCERS (sore throat, sores in mouth, or a toothache) UNUSUAL RASH, SWELLING OR PAIN  UNUSUAL VAGINAL DISCHARGE OR ITCHING   Items with * indicate a potential emergency and should be followed up as soon as possible or go to the Emergency Department if any problems should occur.  Please show the CHEMOTHERAPY ALERT CARD or IMMUNOTHERAPY ALERT CARD at  check-in to the Emergency Department and triage nurse.  Should you have questions after your visit or need to cancel or reschedule your appointment, please contact [facility]  [phone] and follow the prompts.  Office hours are 8:00 a.m. to 4:30 p.m. Monday - Friday. Please note that voicemails left after 4:00 p.m. may not be returned until the following business day.  We are closed weekends and major holidays. You have access to a nurse at all times for urgent questions. Please call the main number to the clinic [phone] and follow the prompts.  For any non-urgent questions, you may also contact your provider using MyChart. We now offer e-Visits for anyone 18 and older to request care online for non-urgent symptoms. For details visit [email or web address].   Also download the MyChart app! Go to the app store, search "MyChart", open the app, select [facility], and log in with your MyChart username and password.  Masks are optional in the cancer centers. If you would like for your care team to wear a mask while they are taking care of you, please let them know. For doctor visits, patients may have with them one support person who is at least 84 years old. At this time, visitors are not allowed in the infusion area.   

## 2021-10-31 ENCOUNTER — Telehealth: Payer: Self-pay

## 2021-10-31 NOTE — Telephone Encounter (Signed)
Received a call from patients daughter [name] stating that Mr. Qazi is requesting an appointment with Dr. Sondra Come after recent bone scan that shows some new bone lesions. Please advise

## 2021-11-01 ENCOUNTER — Encounter (HOSPITAL_COMMUNITY): Payer: Self-pay | Admitting: Oncology

## 2021-11-01 ENCOUNTER — Encounter: Payer: Self-pay | Admitting: Oncology

## 2021-11-04 ENCOUNTER — Other Ambulatory Visit: Payer: Self-pay | Admitting: Oncology

## 2021-11-05 ENCOUNTER — Other Ambulatory Visit: Payer: Self-pay | Admitting: Hospice and Palliative Medicine

## 2021-11-05 ENCOUNTER — Encounter: Payer: Medicare Other | Admitting: Nurse Practitioner

## 2021-11-05 ENCOUNTER — Telehealth: Payer: Self-pay | Admitting: Nurse Practitioner

## 2021-11-05 MED ORDER — DEXAMETHASONE 4 MG PO TABS: 4.0000 mg | ORAL_TABLET | Freq: Every day | ORAL | 0 refills | Status: DC

## 2021-11-05 NOTE — Telephone Encounter (Signed)
I called [name], Mr. Oakley daughter. [name] endorses Mr Placeres is doing well, going to church, much improved functionally. [name] endorses Mr. Belvedere denies any side effects from recent treatment of Daratumumab. We talked about recent labs with Dr Gary Fleet Oncology visit, with labs trending up with recommendation to stop treatment and proceed with hospice. [name] endorses Mr Powe wishes to live as long as possible, wishes to continue treatment and not open to Hospice at this time. We talked about symptoms, currently asymptomatic. We talked about palliative role and transformation; Palliative RN to continue to follow with home pc.   Total time 15 minutes Documentation 5 minutes Phone discussion 10 minutes

## 2021-11-05 NOTE — Progress Notes (Signed)
This encounter was created in error - please disregard.

## 2021-11-06 ENCOUNTER — Telehealth: Payer: Self-pay | Admitting: Radiation Oncology

## 2021-11-06 NOTE — Telephone Encounter (Signed)
Returned call after receiving VM from pt's daughter about needing to r/s [facility] appt.

## 2021-11-08 NOTE — Progress Notes (Signed)
Histology and Location of Primary Cancer: Multiple Myeloma  Location(s) of Symptomatic Metastases: right hip. Left hip and left shoulder  Past/Anticipated chemotherapy by medical oncology, if any: daratumumab and Zometa.  Pain on a scale of 0-10 is: 6 - takes oxycodone and ibuprofen as needed.  Ambulatory status? Walker? Wheelchair?: walker  SAFETY ISSUES: Prior radiation?  Radiation Treatment Dates: 1) 09/29/17 - 10/14/17 2) 02/19/17 - 02/22/17 3) 12/01/16 - 12/10/16  4) 04/19/2014 - 05/02/2014  5) 02/21/2014 - 03/08/2014 6) 01/05/2013 - 02/10/2013   Site/dose:   1) Ribs, Left / 30 Gy delivered in 10 fractions of 3 Gy 2) Right temporal Scalp / 30 Gy in 10 fractions 3) 1. Parasternal nodule, chest- 24 Gy total delivered in 8 fractions      2. Left sacro-iliac, pelvis- 24 Gy total delivered in 8 fractions  4) Lumbar spine and 25 gray in 10 fractions 5) Right posterior chest wall area, 30 gray in 10 fractions 6) Right occipital condyle region to 45 Gy in 25 fractions  Left hip 8 Gy 09/17/2021   Pacemaker/ICD? no Possible current pregnancy? no Is the patient on methotrexate? no  Current Complaints / other details:  He has started taking decadron 4 mg daily for appetite.  He is here with his daughter.  BP 127/61 (BP Location: Left Arm, Patient Position: Sitting)   Pulse 68   Temp 98 F (36.7 C) (Oral)   Resp 18   Ht 5' 8"  (1.727 m)   Wt 157 lb 8 oz (71.4 kg)   SpO2 99%   BMI 23.95 kg/m

## 2021-11-10 NOTE — Progress Notes (Incomplete)
Radiation Oncology         [phone]) [phone] ________________________________  Outpatient Re-Consultation  Name: Dean [name]. MRN: [id number]  Date: 11/11/2021  DOB: 06-Apr-1935  WF:UXNATFT, Cammie Mcgee, MD  Lloyd Huger, MD   REFERRING PHYSICIAN: Lloyd Huger, MD  DIAGNOSIS: There were no encounter diagnoses.  Multiple myeloma without remission with multiple sites of osseous metastases   HISTORY OF PRESENT ILLNESS::Dean [name]. is a 86 y.o. male who is accompanied by ***. he is seen as a courtesy of [name] for re-evaluation an opinion concerning radiation therapy as part of management for new sites of osseous metastases from multiple myeloma primary. I last met with the patient for re-evaluation on 01/09/20. Since that time, the patient has completed further radiation therapy consisting of palliative single fractionated therapy to the left hip involved by multiple myeloma with Dr. Baruch Gouty this past August 2023. To review, the patient has an extensive history of radiation therapy to his left occipital condyle region,  parasternal region, and the left sacroiliac region.  Prior to his most recent treatment with Dr. Baruch Gouty, he had not been treated with radiation since 2019.  In terms of systemic treatment, the patient has continued to receive daratumumab and Zometa under [name] at [facility].   The patient had an MRI of the right hip on 08/01/21 which showed lesions throughout the pelvis and proximal femur consistent with multiple myeloma.  A lesion was also seen to completely replace the left proximal femoral diaphysis, as well as a lesion in the intracarotid tarik region and femoral neck possibly raising the risk for pathologic fracture.  MRI of his lumbar spine also performed showed multifocal abnormal marrow signals consistent with involvement of multiple myeloma at T11-L5.  Per his consultation visit with Dr. Baruch Gouty on 09/09/21 (prior to  undergoing radiation), the patient reported multiple areas of ongoing pain including his left shoulder, back, hips, and pelvis. The patient also reported a recent fall which resulted in significant left upper chest and back pain.   During his most recent follow-up visit with [name] on 10/30/21, the patient was noted to have issues with frequent falls. His performance status was however noted to have mildly improved given that he had recently gained some weight back. In terms of other symptoms, the patient endorsed chronic weakness, fatigue, bilateral hip pain, and poor appetite.    The patient recently had a whole body bone scan performed on 09/19/21 which showed multiple sites of abnormal tracer uptake consistent with osseous metastases involving the pelvis, left femur, and thoracic spine. The scan also showed uptake at known fracture in the left clavicle and L4 vertebral body, and additional sites of uptake in multiple ribs which may possibly represent a combination of fractures and or metastatic lesions.  Given findings suggestive of new sites of osseous metastases on his recent bone scan, the patient called our office requesting to be seen for re-evaluation and consideration of further radiation therapy.   (The patient also had an x-ray of the left hip on 08/18/21 after a fall which showed several lytic lesions in the proximal femurs and pelvic bones consistent with multiple myeloma. Severe degenerative changes were also appreciated in the right hip, as well as moderate degenerative changes in the left hip).     PREVIOUS RADIATION THERAPY: Yes,   7) 8 Gy to the left hip in August 2023 with Dr. Baruch Gouty  6) Diagnosis:   Recurrent Multiple Myeloma  Indication for treatment:  Palliative      Radiation treatment dates:   09/29/17 - 10/14/17 Site/dose:   Ribs, Left/ 30 Gy delivered in 10 fractions of 3 Gy Beams/energy:   Isodose Plan/ 10X  5) Diagnosis: Recurrent multiple myeloma     Indication for treatment: Palliative, pain control     Radiation treatment dates:  02/19/17-02/22/17 Site/dose:  Right temporal Scalp/ 30 Gy in 10 fractions Beams/energy: 3D/ 6X  4) IgG kappa multiple myeloma without remission.  Indication for treatment:  Palliative       Radiation treatment dates:   12/01/16-12/10/16  Site/dose:  1. Parasternal nodule, chest- 24 Gy total delivered in 8 fractions  2. Left sacro-iliac, pelvis- 24 Gy total delivered in 8 fractions  Beams/energy:    1. Isodose Plan Photon, 6X/10X 2. Isodose Plan Photon, 15X  3) Diagnosis:  Multiple myeloma involving the lumbar spine     Indication for treatment:  Significant involvement in the lumbar spine and associated pain      Radiation treatment dates:   March 16 through May 02 2014 Site/dose:   Lumbar spine and 25 gray in 10 fractions Beams/energy:   AP PA, 3-D conformal, 10 and 15 x beams  2) Diagnosis: Multiple myeloma  Indication for treatment:  Painful soft tissue/chest wall mass      Radiation treatment dates:   January 19 through March 08 2014 Site/dose:  Right posterior chest wall area, 30 gray in 10 fractions Beams/energy:   3-D conformal, 15x, 6x  1) Diagnosis:      Left occipital condyle infiltrative lesion, occipital condyle syndrome Indication for treatment:  Pain and neurologic compromise Radiation treatment dates:   December 3 through February 10 2013 Site/dose:   Left occipital condyle region, 45 Gy in 25 fractions Beams/energy:   3 field conformal setup, 10 and 15 MV photons  PAST MEDICAL HISTORY:  Past Medical History:  Diagnosis Date   BPH (benign prostatic hyperplasia)    Colonic diverticular abscess 01/08/2015   Diverticulitis 00/86/76   complicated by abscess and required percutaneous drainage   GERD (gastroesophageal reflux disease)    H/O ETOH abuse    History of chemotherapy last done jan 2017   History of radiation therapy 01/05/13-02/10/13   45 gray to left occipital condyle  region   History of radiation therapy 12/01/16-12/10/16   Parasternal nodule, chest- 24 Gy total delivered in 8 fractions, Left sacro-iliac, pelvis- 24 Gy total delivered in 8 fractions    History of radiation therapy 02/19/17-02/22/17   right temporal scalp 30 Gy in 10 fractions   Intra-abdominal abscess (HCC)    Multiple myeloma ([place]) 2015   Neuropathy    Radiation 02/21/14-03/08/14   right posterior chest wall area 30 gray   Radiation 04/19/14-05/02/14   lumbar spine 25 gray   Skull lesion    Left occipital condyle   Wrist fracture, left    x 2    PAST SURGICAL HISTORY: Past Surgical History:  Procedure Laterality Date   COLONOSCOPY N/A 04/19/2015   Procedure: COLONOSCOPY;  Surgeon: Danie Binder, MD;  [facility];  Service: Endoscopy;  Laterality: N/A;  1:30 PM   CYST EXCISION  1959   tail bone   IR IMAGING GUIDED PORT INSERTION  09/25/2017    FAMILY HISTORY:  Family History  Problem Relation Age of Onset   Diabetes Mother    Stroke Mother    Hypertension Father     SOCIAL HISTORY:  Social History  Tobacco Use   Smoking status: Never   Smokeless tobacco: Never  Vaping Use   Vaping Use: Never used  Substance Use Topics   Alcohol use: No    Comment: " Not much no more"   Drug use: No    ALLERGIES:  Allergies  Allergen Reactions   Codeine Other (See Comments)    Headache   Morphine And Related Other (See Comments)    Makes him feel weird   Revlimid [Lenalidomide] Other (See Comments)    "Causes me to become weak"    MEDICATIONS:  Current Outpatient Medications  Medication Sig Dispense Refill   acyclovir (ZOVIRAX) 400 MG tablet TAKE 1 TABLET BY MOUTH TWICE A DAY 60 tablet 6   ALPRAZolam (XANAX) 1 MG tablet TAKE 1 TABLET BY MOUTH 3 TIMES DAILY AS NEEDED FOR ANXIETY 90 tablet 0   CALCIUM-VITAMIN D PO Take 1 tablet by mouth 2 (two) times daily.      cephALEXin (KEFLEX) 500 MG capsule Take 1 capsule (500 mg total) by mouth 3 (three) times daily.  42 capsule 0   cetirizine (ZYRTEC) 10 MG tablet Take 1 tablet (10 mg total) by mouth daily. 30 tablet 0   dexamethasone (DECADRON) 4 MG tablet      dexamethasone (DECADRON) 4 MG tablet Take 1 tablet (4 mg total) by mouth daily. 15 tablet 0   fluticasone (FLONASE) 50 MCG/ACT nasal spray Place 2 sprays into both nostrils daily. 16 g 6   furosemide (LASIX) 20 MG tablet TAKE 1 TABLET BY MOUTH ONCE A DAY AS NEEDED 30 tablet 3   gabapentin (NEURONTIN) 400 MG capsule TAKE 1 CAPSULE BY MOUTH 3 TIMES A DAY 90 capsule 4   Homeopathic Products (THERAWORX RELIEF EX) Apply topically as needed.     ibuprofen (ADVIL) 600 MG tablet Take 1 tablet (600 mg total) by mouth every 8 (eight) hours as needed. 30 tablet 0   lidocaine-prilocaine (EMLA) cream Apply small amount over port one (1) hour prior to appointment. 30 g 0   montelukast (SINGULAIR) 10 MG tablet TAKE 1 TABLET BY MOUTH EVERY NIGHT AT BEDTIME 30 tablet 3   naloxone (NARCAN) nasal spray 4 mg/0.1 mL SPRAY 1 SPRAY INTO ONE NOSTRIL AS DIRECTED FOR OPIOID OVERDOSE (TURN PERSON ON SIDE AFTER DOSE. IF NO RESPONSE IN 2-3 MINUTES OR PERSON RESPONDS BUT RELAPSES, REPEAT USING A NEW SPRAY DEVICE AND SPRAY INTO THE OTHER NOSTRIL. CALL 911 AFTER USE.) * EMERGENCY USE ONLY * 1 each 0   omeprazole (PRILOSEC) 20 MG capsule TAKE 1 CAPSULE BY MOUTH ONCE DAILY 30 capsule 5   oxybutynin (DITROPAN-XL) 10 MG 24 hr tablet TAKE 1 TABLET BY MOUTH EVERY NIGHT AT BEDTIME (Patient not taking: Reported on 08/07/2021) 90 tablet 0   Oxycodone HCl 10 MG TABS TAKE 1 TO 2 TABLETS BY MOUTH 3 TIMES DAILY AS NEEDED 90 tablet 0   predniSONE (DELTASONE) 20 MG tablet Take 2 tablets (40 mg total) by mouth daily with breakfast. (Patient not taking: Reported on 06/27/2021) 10 tablet 0   senna-docusate (SENOKOT-S) 8.6-50 MG tablet Take 1 tablet by mouth 2 (two) times daily. 60 tablet 5   silver sulfADIAZINE (SILVADENE) 1 % cream Apply 1 application topically daily. (Patient not taking: Reported on  08/07/2021) 50 g 0   tamsulosin (FLOMAX) 0.4 MG CAPS capsule TAKE ONE CAPSULE BY MOUTH EVERY NIGHT ATBEDTIME 30 capsule 11   traZODone (DESYREL) 100 MG tablet TAKE 3 TABLETS BY MOUTH AT BEDTIME 90 tablet 1  triamcinolone cream (KENALOG) 0.1 % APPLY 1 APPLICATION TOPICALLY TWICE A DAY (Patient not taking: Reported on 08/07/2021) 30 g 0   TURMERIC PO Take by mouth.     zolendronic acid (ZOMETA) 4 MG/5ML injection Inject 4 mg into the vein every 8 (eight) weeks.     Current Facility-Administered Medications  Medication Dose Route Frequency Provider Last Rate Last Admin   0.9 %  sodium chloride infusion   Intravenous Once Eulogio Bear, NP       Facility-Administered Medications Ordered in Other Encounters  Medication Dose Route Frequency Provider Last Rate Last Admin   heparin lock flush 100 unit/mL  500 Units Intravenous Once Grayland Ormond, Kathlene November, MD        REVIEW OF SYSTEMS:  A 10+ POINT REVIEW OF SYSTEMS WAS OBTAINED including neurology, dermatology, psychiatry, cardiac, respiratory, lymph, extremities, GI, GU, musculoskeletal, constitutional, reproductive, HEENT. ***   PHYSICAL EXAM:  vitals were not taken for this visit.   General: Alert and oriented, in no acute distress HEENT: Head is normocephalic. Extraocular movements are intact. Oropharynx is clear. Neck: Neck is supple, no palpable cervical or supraclavicular lymphadenopathy. Heart: Regular in rate and rhythm with no murmurs, rubs, or gallops. Chest: Clear to auscultation bilaterally, with no rhonchi, wheezes, or rales. Abdomen: Soft, nontender, nondistended, with no rigidity or guarding. Extremities: No cyanosis or edema. Lymphatics: see Neck Exam Skin: No concerning lesions. Musculoskeletal: symmetric strength and muscle tone throughout. Neurologic: Cranial nerves II through XII are grossly intact. No obvious focalities. Speech is fluent. Coordination is intact. Psychiatric: Judgment and insight are intact. Affect is  appropriate. ***  ECOG = ***  0 - Asymptomatic (Fully active, able to carry on all predisease activities without restriction)  1 - Symptomatic but completely ambulatory (Restricted in physically strenuous activity but ambulatory and able to carry out work of a light or sedentary nature. For example, light housework, office work)  2 - Symptomatic, <50% in bed during the day (Ambulatory and capable of all self care but unable to carry out any work activities. Up and about more than 50% of waking hours)  3 - Symptomatic, >50% in bed, but not bedbound (Capable of only limited self-care, confined to bed or chair 50% or more of waking hours)  4 - Bedbound (Completely disabled. Cannot carry on any self-care. Totally confined to bed or chair)  5 - Death   Eustace Pen MM, Creech RH, Tormey DC, et al. [phone]). "Toxicity and response criteria of the [facility]". [place] Oncol. 5 (6): 649-55  LABORATORY DATA:  Lab Results  Component Value Date   WBC 5.7 10/30/2021   HGB 10.8 (L) 10/30/2021   HCT 34.3 (L) 10/30/2021   MCV 93.2 10/30/2021   PLT 165 10/30/2021   NEUTROABS 3.3 10/30/2021   Lab Results  Component Value Date   NA 138 10/30/2021   K 3.2 (L) 10/30/2021   CL 107 10/30/2021   CO2 29 10/30/2021   GLUCOSE 91 10/30/2021   BUN 18 10/30/2021   CREATININE 0.77 10/30/2021   CALCIUM 8.1 (L) 10/30/2021      RADIOGRAPHY: No results found.    IMPRESSION: Multiple myeloma without remission with multiple sites of osseous metastases   ***  Today, I talked to the patient and family about the findings and work-up thus far.  We discussed the natural history of *** and general treatment, highlighting the role of radiotherapy in the management.  We discussed the available radiation techniques, and focused on  the details of logistics and delivery.  We reviewed the anticipated acute and late sequelae associated with radiation in this setting.  The patient was encouraged to  ask questions that I answered to the best of my ability. *** A patient consent form was discussed and signed.  We retained a copy for our records.  The patient would like to proceed with radiation and will be scheduled for CT simulation.  PLAN: ***    *** minutes of total time was spent for this patient encounter, including preparation, face-to-face counseling with the patient and coordination of care, physical exam, and documentation of the encounter.   ------------------------------------------------  Blair Promise, PhD, MD  This document serves as a record of services personally performed by Gery Pray, MD. It was created on his behalf by Roney Mans, a trained medical scribe. The creation of this record is based on the scribe's personal observations and the provider's statements to them. This document has been checked and approved by the attending provider.

## 2021-11-11 ENCOUNTER — Ambulatory Visit
Admission: RE | Admit: 2021-11-11 | Discharge: 2021-11-11 | Disposition: A | Discharge status: Home / Self Care | Payer: Medicare Other | Source: Ambulatory Visit | Attending: Radiation Oncology | Admitting: Radiation Oncology

## 2021-11-11 ENCOUNTER — Encounter: Payer: Self-pay | Admitting: Radiation Oncology

## 2021-11-11 ENCOUNTER — Other Ambulatory Visit: Payer: Self-pay

## 2021-11-11 VITALS — BP 127/61 | HR 68 | Temp 98.0°F | Resp 18 | Ht 68.0 in | Wt 157.5 lb

## 2021-11-11 DIAGNOSIS — M25552 Pain in left hip: Secondary | ICD-10-CM | POA: Diagnosis not present

## 2021-11-11 DIAGNOSIS — G629 Polyneuropathy, unspecified: Secondary | ICD-10-CM | POA: Insufficient documentation

## 2021-11-11 DIAGNOSIS — C9000 Multiple myeloma not having achieved remission: Secondary | ICD-10-CM | POA: Insufficient documentation

## 2021-11-11 DIAGNOSIS — R630 Anorexia: Secondary | ICD-10-CM | POA: Diagnosis not present

## 2021-11-11 DIAGNOSIS — R531 Weakness: Secondary | ICD-10-CM | POA: Insufficient documentation

## 2021-11-11 DIAGNOSIS — R222 Localized swelling, mass and lump, trunk: Secondary | ICD-10-CM | POA: Diagnosis not present

## 2021-11-11 DIAGNOSIS — Z9221 Personal history of antineoplastic chemotherapy: Secondary | ICD-10-CM | POA: Insufficient documentation

## 2021-11-11 DIAGNOSIS — M545 Low back pain, unspecified: Secondary | ICD-10-CM | POA: Insufficient documentation

## 2021-11-11 DIAGNOSIS — K219 Gastro-esophageal reflux disease without esophagitis: Secondary | ICD-10-CM | POA: Insufficient documentation

## 2021-11-11 DIAGNOSIS — Z7952 Long term (current) use of systemic steroids: Secondary | ICD-10-CM | POA: Diagnosis not present

## 2021-11-11 DIAGNOSIS — Z79624 Long term (current) use of inhibitors of nucleotide synthesis: Secondary | ICD-10-CM | POA: Insufficient documentation

## 2021-11-11 DIAGNOSIS — C7951 Secondary malignant neoplasm of bone: Secondary | ICD-10-CM | POA: Diagnosis not present

## 2021-11-11 DIAGNOSIS — Z923 Personal history of irradiation: Secondary | ICD-10-CM | POA: Insufficient documentation

## 2021-11-11 DIAGNOSIS — R102 Pelvic and perineal pain: Secondary | ICD-10-CM | POA: Diagnosis not present

## 2021-11-11 DIAGNOSIS — R5383 Other fatigue: Secondary | ICD-10-CM | POA: Insufficient documentation

## 2021-11-11 DIAGNOSIS — M25551 Pain in right hip: Secondary | ICD-10-CM | POA: Insufficient documentation

## 2021-11-11 DIAGNOSIS — Z79899 Other long term (current) drug therapy: Secondary | ICD-10-CM | POA: Insufficient documentation

## 2021-11-11 DIAGNOSIS — Z510 Encounter for antineoplastic radiation therapy: Secondary | ICD-10-CM | POA: Insufficient documentation

## 2021-11-12 ENCOUNTER — Other Ambulatory Visit: Payer: Self-pay | Admitting: Family Medicine

## 2021-11-12 ENCOUNTER — Ambulatory Visit (INDEPENDENT_AMBULATORY_CARE_PROVIDER_SITE_OTHER): Payer: Medicare Other | Admitting: Family Medicine

## 2021-11-12 VITALS — BP 126/82 | HR 73 | Temp 97.5°F | Wt 159.0 lb

## 2021-11-12 DIAGNOSIS — G4700 Insomnia, unspecified: Secondary | ICD-10-CM

## 2021-11-12 DIAGNOSIS — J321 Chronic frontal sinusitis: Secondary | ICD-10-CM

## 2021-11-12 MED ORDER — ZOLPIDEM TARTRATE 10 MG PO TABS: 10.0000 mg | ORAL_TABLET | Freq: Every evening | ORAL | 2 refills | Status: DC | PRN

## 2021-11-12 MED ORDER — AMOXICILLIN-POT CLAVULANATE 875-125 MG PO TABS: 1.0000 | ORAL_TABLET | Freq: Two times a day (BID) | ORAL | 0 refills | Status: DC

## 2021-11-12 MED ORDER — PREDNISONE 20 MG PO TABS: 40.0000 mg | ORAL_TABLET | Freq: Every day | ORAL | 0 refills | Status: DC

## 2021-11-12 NOTE — Progress Notes (Signed)
Subjective:    Patient ID: Dean [name]., male    DOB: 09/30/1935, 86 y.o.   MRN: [id number]  HPI Patient has several concerns.  First he reports head congestion.  This been going on for months.  He is currently on Zyrtec, Flonase, and Singulair yet he continues to report postnasal drip and head congestion.  He states that the congestion is draining down his throat into his lungs causing him to cough.  He does report a headache.  He denies any fevers or chills but he states that the congestion is that it is steadily getting worse and has been present for several months despite maximum therapy for allergies.  Second he request medicine to help him sleep.  He is on 300 mg of trazodone and has been for years.  He has been supplementing that with Xanax which worked initially but is no longer working.  He states that he cannot sleep at night.  Partly is due to the pain that he is suffering due to his multiple bone lesions from his multiple myeloma.  Third he has a "cyst on his back".  However on examination, this is actually the spinous process of T12 or L1.  There is no cyst or abscess. Past Medical History:  Diagnosis Date   BPH (benign prostatic hyperplasia)    Colonic diverticular abscess 01/08/2015   Diverticulitis 88/50/27   complicated by abscess and required percutaneous drainage   GERD (gastroesophageal reflux disease)    H/O ETOH abuse    History of chemotherapy last done jan 2017   History of radiation therapy 01/05/13-02/10/13   45 gray to left occipital condyle region   History of radiation therapy 12/01/16-12/10/16   Parasternal nodule, chest- 24 Gy total delivered in 8 fractions, Left sacro-iliac, pelvis- 24 Gy total delivered in 8 fractions    History of radiation therapy 02/19/17-02/22/17   right temporal scalp 30 Gy in 10 fractions   Intra-abdominal abscess (HCC)    Multiple myeloma ([place]) 2015   Neuropathy    Radiation 02/21/14-03/08/14   right posterior chest wall area 30 gray    Radiation 04/19/14-05/02/14   lumbar spine 25 gray   Skull lesion    Left occipital condyle   Wrist fracture, left    x 2   Past Surgical History:  Procedure Laterality Date   COLONOSCOPY N/A 04/19/2015   Procedure: COLONOSCOPY;  Surgeon: Danie Binder, MD;  [facility];  Service: Endoscopy;  Laterality: N/A;  1:30 PM   CYST EXCISION  1959   tail bone   IR IMAGING GUIDED PORT INSERTION  09/25/2017   Current Outpatient Medications on File Prior to Visit  Medication Sig Dispense Refill   acyclovir (ZOVIRAX) 400 MG tablet TAKE 1 TABLET BY MOUTH TWICE A DAY 60 tablet 6   ALPRAZolam (XANAX) 1 MG tablet TAKE 1 TABLET BY MOUTH 3 TIMES DAILY AS NEEDED FOR ANXIETY 90 tablet 0   CALCIUM-VITAMIN D PO Take 1 tablet by mouth 2 (two) times daily.      cetirizine (ZYRTEC) 10 MG tablet Take 1 tablet (10 mg total) by mouth daily. 30 tablet 0   dexamethasone (DECADRON) 4 MG tablet  (Patient not taking: Reported on 11/11/2021)     dexamethasone (DECADRON) 4 MG tablet Take 1 tablet (4 mg total) by mouth daily. 15 tablet 0   fluticasone (FLONASE) 50 MCG/ACT nasal spray Place 2 sprays into both nostrils daily. 16 g 6   furosemide (LASIX) 20 MG tablet  TAKE 1 TABLET BY MOUTH ONCE A DAY AS NEEDED 30 tablet 3   gabapentin (NEURONTIN) 400 MG capsule TAKE 1 CAPSULE BY MOUTH 3 TIMES A DAY 90 capsule 4   Homeopathic Products (THERAWORX RELIEF EX) Apply topically as needed.     ibuprofen (ADVIL) 600 MG tablet Take 1 tablet (600 mg total) by mouth every 8 (eight) hours as needed. 30 tablet 0   lidocaine-prilocaine (EMLA) cream Apply small amount over port one (1) hour prior to appointment. 30 g 0   montelukast (SINGULAIR) 10 MG tablet TAKE 1 TABLET BY MOUTH EVERY NIGHT AT BEDTIME 30 tablet 3   naloxone (NARCAN) nasal spray 4 mg/0.1 mL SPRAY 1 SPRAY INTO ONE NOSTRIL AS DIRECTED FOR OPIOID OVERDOSE (TURN PERSON ON SIDE AFTER DOSE. IF NO RESPONSE IN 2-3 MINUTES OR PERSON RESPONDS BUT RELAPSES, REPEAT USING A  NEW SPRAY DEVICE AND SPRAY INTO THE OTHER NOSTRIL. CALL 911 AFTER USE.) * EMERGENCY USE ONLY * 1 each 0   omeprazole (PRILOSEC) 20 MG capsule TAKE 1 CAPSULE BY MOUTH ONCE DAILY 30 capsule 5   oxybutynin (DITROPAN-XL) 10 MG 24 hr tablet TAKE 1 TABLET BY MOUTH EVERY NIGHT AT BEDTIME (Patient not taking: Reported on 08/07/2021) 90 tablet 0   Oxycodone HCl 10 MG TABS TAKE 1 TO 2 TABLETS BY MOUTH 3 TIMES DAILY AS NEEDED 90 tablet 0   predniSONE (DELTASONE) 20 MG tablet Take 2 tablets (40 mg total) by mouth daily with breakfast. (Patient not taking: Reported on 06/27/2021) 10 tablet 0   senna-docusate (SENOKOT-S) 8.6-50 MG tablet Take 1 tablet by mouth 2 (two) times daily. 60 tablet 5   silver sulfADIAZINE (SILVADENE) 1 % cream Apply 1 application topically daily. (Patient not taking: Reported on 08/07/2021) 50 g 0   tamsulosin (FLOMAX) 0.4 MG CAPS capsule TAKE ONE CAPSULE BY MOUTH EVERY NIGHT ATBEDTIME 30 capsule 11   traZODone (DESYREL) 100 MG tablet TAKE 3 TABLETS BY MOUTH AT BEDTIME 90 tablet 1   triamcinolone cream (KENALOG) 0.1 % APPLY 1 APPLICATION TOPICALLY TWICE A DAY (Patient not taking: Reported on 11/11/2021) 30 g 0   TURMERIC PO Take by mouth.     zolendronic acid (ZOMETA) 4 MG/5ML injection Inject 4 mg into the vein every 8 (eight) weeks.     Current Facility-Administered Medications on File Prior to Visit  Medication Dose Route Frequency Provider Last Rate Last Admin   0.9 %  sodium chloride infusion   Intravenous Once Noemi Chapel A, NP       heparin lock flush 100 unit/mL  500 Units Intravenous Once Grayland Ormond, Kathlene November, MD       Allergies  Allergen Reactions   Codeine Other (See Comments)    Headache   Morphine And Related Other (See Comments)    Makes him feel weird   Revlimid [Lenalidomide] Other (See Comments)    "Causes me to become weak"   Social History   Socioeconomic History   Marital status: Married    Spouse name: [place]   Number of children: 3   Years of education:  Not on file   Highest education level: Not on file  Occupational History    Employer: RETIRED  Tobacco Use   Smoking status: Never   Smokeless tobacco: Never  Vaping Use   Vaping Use: Never used  Substance and Sexual Activity   Alcohol use: No    Comment: " Not much no more"   Drug use: No   Sexual activity: Not on file  Other Topics Concern   Not on file  Social History Narrative   Not on file   Social Determinants of Health   Financial Resource Strain: Low Risk  (04/25/2021)   Overall Financial Resource Strain (CARDIA)    Difficulty of Paying Living Expenses: Not hard at all  Food Insecurity: No Food Insecurity (04/25/2021)   Hunger Vital Sign    Worried About Running Out of Food in the Last Year: Never true    Ran Out of Food in the Last Year: Never true  Transportation Needs: No Transportation Needs (04/25/2021)   PRAPARE - Hydrologist (Medical): No    Lack of Transportation (Non-Medical): No  Physical Activity: Insufficiently Active (04/25/2021)   Exercise Vital Sign    Days of Exercise per Week: 5 days    Minutes of Exercise per Session: 20 min  Stress: No Stress Concern Present (04/25/2021)   [place]    Feeling of Stress : Not at all  Social Connections: Moderately Isolated (04/25/2021)   Social Connection and Isolation Panel [NHANES]    Frequency of Communication with Friends and Family: More than three times a week    Frequency of Social Gatherings with Friends and Family: More than three times a week    Attends Religious Services: Never    Marine scientist or Organizations: No    Attends Archivist Meetings: Never    Marital Status: Married  Human resources officer Violence: Not At Risk (04/25/2021)   Humiliation, Afraid, Rape, and Kick questionnaire    Fear of Current or Ex-Partner: No    Emotionally Abused: No    Physically Abused: No    Sexually Abused:  No      Review of Systems     Objective:   Physical Exam Vitals reviewed.  Constitutional:      General: He is not in acute distress.    Appearance: He is obese. He is not ill-appearing or toxic-appearing.  HENT:     Right Ear: Tympanic membrane and ear canal normal.     Left Ear: Tympanic membrane and ear canal normal.     Nose:     Right Nostril: No occlusion.     Left Nostril: No occlusion.     Right Turbinates: Swollen. Not enlarged.     Left Turbinates: Swollen. Not enlarged.     Right Sinus: Frontal sinus tenderness present.     Left Sinus: Frontal sinus tenderness present.  Cardiovascular:     Rate and Rhythm: Normal rate and regular rhythm.     Pulses: Normal pulses.     Heart sounds: Normal heart sounds.  Pulmonary:     Effort: Pulmonary effort is normal. No respiratory distress.     Breath sounds: Normal breath sounds. No stridor. No wheezing, rhonchi or rales.  Abdominal:     General: Abdomen is flat. Bowel sounds are normal.     Palpations: Abdomen is soft.  Neurological:     General: No focal deficit present.     Mental Status: He is alert and oriented to person, place, and time.     Cranial Nerves: No cranial nerve deficit.     Motor: No weakness.           Assessment & Plan:  Chronic frontal sinusitis  Insomnia, unspecified type I will try treating his chronic sinusitis with a prednisone taper pack and Augmentin 875 mg twice daily for 10 days.  Reassess at the conclusion of 10 days or sooner if worse.  Recommended discontinuation of Xanax and replacing it with Ambien 10 mg p.o. nightly as needed insomnia.  The lesion on his back is the spinous process of T12 or L1.  There is no cyst or abscess

## 2021-11-13 ENCOUNTER — Telehealth: Payer: Self-pay

## 2021-11-13 NOTE — Telephone Encounter (Signed)
Pt's daughter, [name], called and states that she read that the Prednisone and the Decadron her dad currently takes, should not be taken together. Are you okay with him taking both? Thank you.

## 2021-11-14 DIAGNOSIS — Z510 Encounter for antineoplastic radiation therapy: Secondary | ICD-10-CM | POA: Diagnosis not present

## 2021-11-14 DIAGNOSIS — C9000 Multiple myeloma not having achieved remission: Secondary | ICD-10-CM | POA: Diagnosis not present

## 2021-11-14 DIAGNOSIS — C7951 Secondary malignant neoplasm of bone: Secondary | ICD-10-CM | POA: Diagnosis not present

## 2021-11-18 ENCOUNTER — Other Ambulatory Visit: Payer: Self-pay | Admitting: Hospice and Palliative Medicine

## 2021-11-18 ENCOUNTER — Other Ambulatory Visit: Payer: Self-pay

## 2021-11-18 ENCOUNTER — Ambulatory Visit
Admission: RE | Admit: 2021-11-18 | Discharge: 2021-11-18 | Disposition: A | Discharge status: Home / Self Care | Payer: Medicare Other | Source: Ambulatory Visit | Attending: Radiation Oncology | Admitting: Radiation Oncology

## 2021-11-18 ENCOUNTER — Other Ambulatory Visit: Payer: Self-pay | Admitting: Oncology

## 2021-11-18 DIAGNOSIS — C9000 Multiple myeloma not having achieved remission: Secondary | ICD-10-CM

## 2021-11-18 DIAGNOSIS — Z510 Encounter for antineoplastic radiation therapy: Secondary | ICD-10-CM | POA: Diagnosis not present

## 2021-11-18 DIAGNOSIS — C7951 Secondary malignant neoplasm of bone: Secondary | ICD-10-CM | POA: Diagnosis not present

## 2021-11-18 LAB — RAD ONC ARIA SESSION SUMMARY
Course Elapsed Days: 0
Plan Fractions Treated to Date: 1
Plan Prescribed Dose Per Fraction: 5 Gy
Plan Total Fractions Prescribed: 4
Plan Total Prescribed Dose: 20 Gy
Reference Point Dosage Given to Date: 5 Gy
Reference Point Session Dosage Given: 5 Gy
Session Number: 1

## 2021-11-18 MED ORDER — IBUPROFEN 600 MG PO TABS: 600.0000 mg | ORAL_TABLET | Freq: Three times a day (TID) | ORAL | 0 refills | Status: DC | PRN

## 2021-11-19 ENCOUNTER — Ambulatory Visit
Admission: RE | Admit: 2021-11-19 | Discharge: 2021-11-19 | Disposition: A | Discharge status: Home / Self Care | Payer: Medicare Other | Source: Ambulatory Visit | Attending: Radiation Oncology | Admitting: Radiation Oncology

## 2021-11-19 ENCOUNTER — Other Ambulatory Visit: Payer: Self-pay

## 2021-11-19 DIAGNOSIS — C9000 Multiple myeloma not having achieved remission: Secondary | ICD-10-CM | POA: Diagnosis not present

## 2021-11-19 DIAGNOSIS — Z510 Encounter for antineoplastic radiation therapy: Secondary | ICD-10-CM | POA: Diagnosis not present

## 2021-11-19 LAB — RAD ONC ARIA SESSION SUMMARY
Course Elapsed Days: 1
Plan Fractions Treated to Date: 2
Plan Prescribed Dose Per Fraction: 5 Gy
Plan Total Fractions Prescribed: 4
Plan Total Prescribed Dose: 20 Gy
Reference Point Dosage Given to Date: 10 Gy
Reference Point Session Dosage Given: 5 Gy
Session Number: 2

## 2021-11-20 ENCOUNTER — Ambulatory Visit
Admission: RE | Admit: 2021-11-20 | Discharge: 2021-11-20 | Disposition: A | Discharge status: Home / Self Care | Payer: Medicare Other | Source: Ambulatory Visit | Attending: Radiation Oncology | Admitting: Radiation Oncology

## 2021-11-20 ENCOUNTER — Other Ambulatory Visit: Payer: Self-pay

## 2021-11-20 DIAGNOSIS — Z510 Encounter for antineoplastic radiation therapy: Secondary | ICD-10-CM | POA: Diagnosis not present

## 2021-11-20 DIAGNOSIS — C9000 Multiple myeloma not having achieved remission: Secondary | ICD-10-CM | POA: Diagnosis not present

## 2021-11-20 LAB — RAD ONC ARIA SESSION SUMMARY
Course Elapsed Days: 2
Plan Fractions Treated to Date: 3
Plan Prescribed Dose Per Fraction: 5 Gy
Plan Total Fractions Prescribed: 4
Plan Total Prescribed Dose: 20 Gy
Reference Point Dosage Given to Date: 15 Gy
Reference Point Session Dosage Given: 5 Gy
Session Number: 3

## 2021-11-21 ENCOUNTER — Encounter: Payer: Self-pay | Admitting: Radiation Oncology

## 2021-11-21 ENCOUNTER — Other Ambulatory Visit: Payer: Self-pay | Admitting: Family Medicine

## 2021-11-21 ENCOUNTER — Other Ambulatory Visit: Payer: Self-pay

## 2021-11-21 ENCOUNTER — Ambulatory Visit
Admission: RE | Admit: 2021-11-21 | Discharge: 2021-11-21 | Disposition: A | Discharge status: Home / Self Care | Payer: Medicare Other | Source: Ambulatory Visit | Attending: Radiation Oncology | Admitting: Radiation Oncology

## 2021-11-21 ENCOUNTER — Other Ambulatory Visit: Payer: Self-pay | Admitting: Oncology

## 2021-11-21 DIAGNOSIS — C7951 Secondary malignant neoplasm of bone: Secondary | ICD-10-CM | POA: Diagnosis not present

## 2021-11-21 DIAGNOSIS — C9000 Multiple myeloma not having achieved remission: Secondary | ICD-10-CM | POA: Diagnosis not present

## 2021-11-21 DIAGNOSIS — N400 Enlarged prostate without lower urinary tract symptoms: Secondary | ICD-10-CM

## 2021-11-21 DIAGNOSIS — Z510 Encounter for antineoplastic radiation therapy: Secondary | ICD-10-CM | POA: Diagnosis not present

## 2021-11-21 LAB — RAD ONC ARIA SESSION SUMMARY
Course Elapsed Days: 3
Plan Fractions Treated to Date: 4
Plan Prescribed Dose Per Fraction: 5 Gy
Plan Total Fractions Prescribed: 4
Plan Total Prescribed Dose: 20 Gy
Reference Point Dosage Given to Date: 20 Gy
Reference Point Session Dosage Given: 5 Gy
Session Number: 4

## 2021-11-21 NOTE — Telephone Encounter (Signed)
Requested Prescriptions  Pending Prescriptions Disp Refills  . tamsulosin (FLOMAX) 0.4 MG CAPS capsule [Pharmacy Med Name: TAMSULOSIN HCL 0.4 MG CAP] 30 capsule 11    Sig: TAKE ONE CAPSULE BY MOUTH EVERY NIGHT ATBEDTIME     Urology: Alpha-Adrenergic Blocker Failed - 11/21/2021 11:51 AM      Failed - PSA in normal range and within 360 days    PSA  Date Value Ref Range Status  12/01/2012 3.01 <=4.00 ng/mL Final    Comment:    (NOTE) Test Methodology: ECLIA PSA (Electrochemiluminescence Immunoassay) For PSA values from 2.5-4.0, particularly in younger men <60 years old, the AUA and NCCN suggest testing for % Free PSA (3515) and evaluation of the rate of increase in PSA (PSA velocity). Performed at [facility] - Last BP in normal range    BP Readings from Last 1 Encounters:  11/12/21 126/82         Passed - Valid encounter within last 12 months    Recent Outpatient Visits          8 months ago [place] [name], MD   8 months ago Hypotension due to hypovolemia   [place], Warren T, MD   8 months ago Hypotension due to hypovolemia   [place] [name], MD   8 months ago Hypotension due to hypovolemia   [place], NP   1 year ago Seasonal allergic rhinitis due to pollen   [place] Eulogio Bear, NP

## 2021-11-25 ENCOUNTER — Other Ambulatory Visit: Payer: Self-pay | Admitting: Family Medicine

## 2021-11-25 DIAGNOSIS — N400 Enlarged prostate without lower urinary tract symptoms: Secondary | ICD-10-CM

## 2021-11-26 ENCOUNTER — Telehealth: Payer: Self-pay | Admitting: Oncology

## 2021-11-26 ENCOUNTER — Other Ambulatory Visit: Payer: Self-pay | Admitting: Oncology

## 2021-11-26 NOTE — Telephone Encounter (Signed)
Patients daughter called and states he is not feeling well. She would like to see if they can reschedule his treatment to next week. Please advise.   Gillermina Phy  [phone]

## 2021-11-27 ENCOUNTER — Inpatient Hospital Stay: Payer: Medicare Other | Attending: Oncology

## 2021-11-27 ENCOUNTER — Inpatient Hospital Stay: Payer: Medicare Other

## 2021-11-27 ENCOUNTER — Inpatient Hospital Stay: Payer: Medicare Other | Admitting: Oncology

## 2021-11-27 NOTE — Progress Notes (Signed)
Nutrition Follow-up:  Patient with multiple myeloma.  Receiving daratumumab.    Called and spoke with daughter.  Patient is feeling better and has been eating better.  Has been receiving palliative radiation with Dr Sondra Come.  Daughter is helping to prepare meals.     Medications: decadron  Labs: reviewed  Anthropometrics:   Weight 159 lb on 10/10  162 lb on 9/27 168 lb on 7/5 173 lb on 3/22 183 lb on 2/7 176 lb on 1/11   NUTRITION DIAGNOSIS: Inadequate oral intake continues    INTERVENTION:  Continue to encourage daughter to prepare high calorie, high protein foods Continue carnation breakfast essentials Continue decadron to help stimulate appetite    MONITORING, EVALUATION, GOAL: weight trends, intake   NEXT VISIT: Wednesday, Dec 6 phone call  Dean [name], [place], [place] Registered Dietitian [phone]

## 2021-12-03 ENCOUNTER — Other Ambulatory Visit: Payer: Self-pay | Admitting: Oncology

## 2021-12-03 DIAGNOSIS — G4700 Insomnia, unspecified: Secondary | ICD-10-CM

## 2021-12-04 ENCOUNTER — Encounter: Payer: Self-pay | Admitting: Oncology

## 2021-12-04 ENCOUNTER — Inpatient Hospital Stay (HOSPITAL_BASED_OUTPATIENT_CLINIC_OR_DEPARTMENT_OTHER): Payer: Medicare Other | Admitting: Oncology

## 2021-12-04 ENCOUNTER — Ambulatory Visit
Admission: RE | Admit: 2021-12-04 | Discharge: 2021-12-04 | Disposition: A | Discharge status: Home / Self Care | Payer: Medicare Other | Attending: Oncology | Admitting: Oncology

## 2021-12-04 ENCOUNTER — Encounter (HOSPITAL_COMMUNITY): Payer: Self-pay | Admitting: Oncology

## 2021-12-04 ENCOUNTER — Inpatient Hospital Stay: Payer: Medicare Other

## 2021-12-04 ENCOUNTER — Inpatient Hospital Stay: Payer: Medicare Other | Attending: Oncology

## 2021-12-04 ENCOUNTER — Ambulatory Visit
Admission: RE | Admit: 2021-12-04 | Discharge: 2021-12-04 | Disposition: A | Discharge status: Home / Self Care | Payer: Medicare Other | Source: Ambulatory Visit | Attending: Oncology | Admitting: Oncology

## 2021-12-04 VITALS — BP 102/45 | HR 48 | Temp 97.0°F | Resp 16 | Ht 68.0 in

## 2021-12-04 DIAGNOSIS — C9000 Multiple myeloma not having achieved remission: Secondary | ICD-10-CM | POA: Insufficient documentation

## 2021-12-04 DIAGNOSIS — Z5112 Encounter for antineoplastic immunotherapy: Secondary | ICD-10-CM | POA: Diagnosis present

## 2021-12-04 DIAGNOSIS — R296 Repeated falls: Secondary | ICD-10-CM | POA: Diagnosis not present

## 2021-12-04 DIAGNOSIS — M25551 Pain in right hip: Secondary | ICD-10-CM

## 2021-12-04 LAB — CBC WITH DIFFERENTIAL/PLATELET
Abs Immature Granulocytes: 0.16 10*3/uL — ABNORMAL HIGH (ref 0.00–0.07)
Basophils Absolute: 0 10*3/uL (ref 0.0–0.1)
Basophils Relative: 0 %
Eosinophils Absolute: 0 10*3/uL (ref 0.0–0.5)
Eosinophils Relative: 0 %
HCT: 28.3 % — ABNORMAL LOW (ref 39.0–52.0)
Hemoglobin: 8.8 g/dL — ABNORMAL LOW (ref 13.0–17.0)
Immature Granulocytes: 2 %
Lymphocytes Relative: 14 %
Lymphs Abs: 0.9 10*3/uL (ref 0.7–4.0)
MCH: 29.9 pg (ref 26.0–34.0)
MCHC: 31.1 g/dL (ref 30.0–36.0)
MCV: 96.3 fL (ref 80.0–100.0)
Monocytes Absolute: 0.4 10*3/uL (ref 0.1–1.0)
Monocytes Relative: 6 %
Neutro Abs: 5.4 10*3/uL (ref 1.7–7.7)
Neutrophils Relative %: 78 %
Platelets: 170 10*3/uL (ref 150–400)
RBC: 2.94 MIL/uL — ABNORMAL LOW (ref 4.22–5.81)
RDW: 16.6 % — ABNORMAL HIGH (ref 11.5–15.5)
WBC: 7 10*3/uL (ref 4.0–10.5)
nRBC: 0.3 % — ABNORMAL HIGH (ref 0.0–0.2)

## 2021-12-04 LAB — MAGNESIUM: Magnesium: 2 mg/dL (ref 1.7–2.4)

## 2021-12-04 LAB — COMPREHENSIVE METABOLIC PANEL
ALT: 12 U/L (ref 0–44)
AST: 26 U/L (ref 15–41)
Albumin: 1.8 g/dL — ABNORMAL LOW (ref 3.5–5.0)
Alkaline Phosphatase: 26 U/L — ABNORMAL LOW (ref 38–126)
Anion gap: 3 — ABNORMAL LOW (ref 5–15)
BUN: 24 mg/dL — ABNORMAL HIGH (ref 8–23)
CO2: 26 mmol/L (ref 22–32)
Calcium: 7.7 mg/dL — ABNORMAL LOW (ref 8.9–10.3)
Chloride: 104 mmol/L (ref 98–111)
Creatinine, Ser: 0.78 mg/dL (ref 0.61–1.24)
GFR, Estimated: >60 mL/min (ref >60)
Glucose, Bld: 152 mg/dL — ABNORMAL HIGH (ref 70–99)
Potassium: 3.6 mmol/L (ref 3.5–5.1)
Sodium: 133 mmol/L — ABNORMAL LOW (ref 135–145)
Total Bilirubin: 0.2 mg/dL — ABNORMAL LOW (ref 0.3–1.2)
Total Protein: 8.7 g/dL — ABNORMAL HIGH (ref 6.5–8.1)

## 2021-12-04 MED ORDER — ZOLEDRONIC ACID 4 MG/100ML IV SOLN
4.0000 mg | Freq: Once | INTRAVENOUS | Status: AC
  Administered 2021-12-04: 4 mg via INTRAVENOUS
  Filled 2021-12-04: qty 100

## 2021-12-04 MED ORDER — SODIUM CHLORIDE 0.9 % IV SOLN
Freq: Once | INTRAVENOUS | Status: AC
  Filled 2021-12-04: qty 250

## 2021-12-04 MED ORDER — ACETAMINOPHEN 325 MG PO TABS
650.0000 mg | ORAL_TABLET | Freq: Once | ORAL | Status: AC
  Administered 2021-12-04: 650 mg via ORAL
  Filled 2021-12-04: qty 2

## 2021-12-04 MED ORDER — HEPARIN SOD (PORK) LOCK FLUSH 100 UNIT/ML IV SOLN
500.0000 [IU] | Freq: Once | INTRAVENOUS | Status: AC
  Administered 2021-12-04: 500 [IU] via INTRAVENOUS
  Filled 2021-12-04: qty 5

## 2021-12-04 MED ORDER — DIPHENHYDRAMINE HCL 25 MG PO CAPS
50.0000 mg | ORAL_CAPSULE | Freq: Once | ORAL | Status: AC
  Administered 2021-12-04: 50 mg via ORAL
  Filled 2021-12-04: qty 2

## 2021-12-04 MED ORDER — DARATUMUMAB-HYALURONIDASE-FIHJ 1800-30000 MG-UT/15ML ~~LOC~~ SOLN
1800.0000 mg | Freq: Once | SUBCUTANEOUS | Status: AC
  Administered 2021-12-04: 1800 mg via SUBCUTANEOUS
  Filled 2021-12-04: qty 15

## 2021-12-04 MED ORDER — SODIUM CHLORIDE 0.9% FLUSH
10.0000 mL | Freq: Once | INTRAVENOUS | Status: AC
  Administered 2021-12-04: 10 mL via INTRAVENOUS
  Filled 2021-12-04: qty 10

## 2021-12-04 MED ORDER — DEXAMETHASONE 4 MG PO TABS
20.0000 mg | ORAL_TABLET | Freq: Once | ORAL | Status: AC
  Administered 2021-12-04: 20 mg via ORAL
  Filled 2021-12-04: qty 5

## 2021-12-04 NOTE — Progress Notes (Signed)
[place]  [phone] [phone]  ID: Dean [name]. OB: 07/21/1935  MR#: [id number]  AOZ#:[id number]  Patient Care Team: [name], MD as PCP - General (Family Medicine) Gery Pray, MD as Consulting Physician (Radiation Oncology) Danie Binder, MD (Inactive) as Consulting Physician (Gastroenterology) Michael Boston, MD as Consulting Physician (General Surgery) Lloyd Huger, MD as Consulting Physician (Oncology)  CHIEF COMPLAINT: Multiple myeloma  INTERVAL HISTORY: Patient returns to clinic today for repeat laboratory work, further evaluation, and continuation of daratumumab and Zometa.  He had several more falls in the past week.  He now complains of increased right flank/hip pain.  He has chronic weakness and fatigue.  He continues to have bilateral hip pain.  He has no neurologic complaints.  He denies any recent fevers or illnesses.  He has a poor appetite.  He has no chest pain, shortness of breath, cough, or hemoptysis.  He denies any nausea, vomiting, constipation, or diarrhea.  He has no urinary complaints.  Patient offers no further specific complaints today.  REVIEW OF SYSTEMS:   Review of Systems  Constitutional:  Positive for malaise/fatigue. Negative for fever and weight loss.  Respiratory: Negative.  Negative for cough, hemoptysis and shortness of breath.   Cardiovascular: Negative.  Negative for chest pain and leg swelling.  Gastrointestinal: Negative.  Negative for abdominal pain and nausea.  Genitourinary:  Positive for flank pain. Negative for dysuria.  Musculoskeletal:  Positive for back pain, falls and joint pain.  Skin: Negative.  Negative for rash.  Neurological:  Positive for sensory change and weakness. Negative for dizziness, focal weakness and headaches.  Psychiatric/Behavioral: Negative.  The patient is not nervous/anxious and does not have insomnia.     As per HPI. Otherwise, a complete review of  systems is negative.  PAST MEDICAL HISTORY: Past Medical History:  Diagnosis Date   BPH (benign prostatic hyperplasia)    Colonic diverticular abscess 01/08/2015   Diverticulitis 96/29/52   complicated by abscess and required percutaneous drainage   GERD (gastroesophageal reflux disease)    H/O ETOH abuse    History of chemotherapy last done jan 2017   History of radiation therapy 01/05/13-02/10/13   45 gray to left occipital condyle region   History of radiation therapy 12/01/16-12/10/16   Parasternal nodule, chest- 24 Gy total delivered in 8 fractions, Left sacro-iliac, pelvis- 24 Gy total delivered in 8 fractions    History of radiation therapy 02/19/17-02/22/17   right temporal scalp 30 Gy in 10 fractions   Intra-abdominal abscess ([facility])    Multiple myeloma ([place]) 2015   Neuropathy    Radiation 02/21/14-03/08/14   right posterior chest wall area 30 gray   Radiation 04/19/14-05/02/14   lumbar spine 25 gray   Skull lesion    Left occipital condyle   Wrist fracture, left    x 2    PAST SURGICAL HISTORY: Past Surgical History:  Procedure Laterality Date   COLONOSCOPY N/A 04/19/2015   Procedure: COLONOSCOPY;  Surgeon: Danie Binder, MD;  Location: [facility];  Service: Endoscopy;  Laterality: N/A;  1:30 PM   CYST EXCISION  1959   tail bone   IR IMAGING GUIDED PORT INSERTION  09/25/2017    FAMILY HISTORY: Family History  Problem Relation Age of Onset   Diabetes Mother    Stroke Mother    Hypertension Father     ADVANCED DIRECTIVES (Y/N):  N  HEALTH MAINTENANCE: Social History   Tobacco Use  Smoking status: Never   Smokeless tobacco: Never  Vaping Use   Vaping Use: Never used  Substance Use Topics   Alcohol use: No    Comment: " Not much no more"   Drug use: No     Colonoscopy:  PAP:  Bone density:  Lipid panel:  Allergies  Allergen Reactions   Codeine Other (See Comments)    Headache   Morphine And Related Other (See Comments)    Makes him feel weird    Revlimid [Lenalidomide] Other (See Comments)    "Causes me to become weak"    Current Outpatient Medications  Medication Sig Dispense Refill   acyclovir (ZOVIRAX) 400 MG tablet TAKE 1 TABLET BY MOUTH TWICE A DAY 60 tablet 6   ALPRAZolam (XANAX) 1 MG tablet TAKE 1 TABLET BY MOUTH 3 TIMES DAILY AS NEEDED FOR ANXIETY 90 tablet 0   CALCIUM-VITAMIN D PO Take 1 tablet by mouth 2 (two) times daily.      cetirizine (ZYRTEC) 10 MG tablet Take 1 tablet (10 mg total) by mouth daily. 30 tablet 0   dexamethasone (DECADRON) 4 MG tablet      dexamethasone (DECADRON) 4 MG tablet TAKE 1 TABLET BY MOUTH ONCE A DAY 30 tablet 0   fluticasone (FLONASE) 50 MCG/ACT nasal spray Place 2 sprays into both nostrils daily. 16 g 6   furosemide (LASIX) 20 MG tablet TAKE 1 TABLET BY MOUTH ONCE A DAY AS NEEDED 30 tablet 3   gabapentin (NEURONTIN) 400 MG capsule TAKE 1 CAPSULE BY MOUTH 3 TIMES A DAY 90 capsule 4   Homeopathic Products (THERAWORX RELIEF EX) Apply topically as needed.     ibuprofen (ADVIL) 600 MG tablet Take 1 tablet (600 mg total) by mouth every 8 (eight) hours as needed. 30 tablet 0   lidocaine-prilocaine (EMLA) cream Apply small amount over port one (1) hour prior to appointment. 30 g 0   montelukast (SINGULAIR) 10 MG tablet TAKE 1 TABLET BY MOUTH EVERY NIGHT AT BEDTIME 30 tablet 3   naloxone (NARCAN) nasal spray 4 mg/0.1 mL SPRAY 1 SPRAY INTO ONE NOSTRIL AS DIRECTED FOR OPIOID OVERDOSE (TURN PERSON ON SIDE AFTER DOSE. IF NO RESPONSE IN 2-3 MINUTES OR PERSON RESPONDS BUT RELAPSES, REPEAT USING A NEW SPRAY DEVICE AND SPRAY INTO THE OTHER NOSTRIL. CALL 911 AFTER USE.) * EMERGENCY USE ONLY * 1 each 0   omeprazole (PRILOSEC) 20 MG capsule TAKE 1 CAPSULE BY MOUTH ONCE DAILY 30 capsule 5   oxybutynin (DITROPAN-XL) 10 MG 24 hr tablet TAKE 1 TABLET BY MOUTH EVERY NIGHT AT BEDTIME 90 tablet 0   Oxycodone HCl 10 MG TABS TAKE 1 TO 2 TABLETS BY MOUTH 3 TIMES DAILY AS NEEDED 90 tablet 0   predniSONE (DELTASONE) 20 MG  tablet Take 2 tablets (40 mg total) by mouth daily with breakfast. 10 tablet 0   senna-docusate (SENOKOT-S) 8.6-50 MG tablet Take 1 tablet by mouth 2 (two) times daily. 60 tablet 5   silver sulfADIAZINE (SILVADENE) 1 % cream Apply 1 application topically daily. 50 g 0   tamsulosin (FLOMAX) 0.4 MG CAPS capsule TAKE ONE CAPSULE BY MOUTH EVERY NIGHT ATBEDTIME 30 capsule 11   traZODone (DESYREL) 100 MG tablet TAKE 3 TABLETS BY MOUTH AT BEDTIME 90 tablet 1   triamcinolone cream (KENALOG) 0.1 % APPLY 1 APPLICATION TOPICALLY TWICE A DAY 30 g 0   TURMERIC PO Take by mouth.     zolendronic acid (ZOMETA) 4 MG/5ML injection Inject 4 mg into the vein  every 8 (eight) weeks.     zolpidem (AMBIEN) 10 MG tablet Take 1 tablet (10 mg total) by mouth at bedtime as needed for sleep (do not take with xanax). 30 tablet 2   Current Facility-Administered Medications  Medication Dose Route Frequency Provider Last Rate Last Admin   0.9 %  sodium chloride infusion   Intravenous Once Eulogio Bear, NP       Facility-Administered Medications Ordered in Other Visits  Medication Dose Route Frequency Provider Last Rate Last Admin   heparin lock flush 100 unit/mL  500 Units Intravenous Once Lloyd Huger, MD        OBJECTIVE: Vitals:   12/04/21 0900 12/04/21 0943  BP: (!) 102/45   Pulse: (!) 48   Resp: 16 16  Temp: (!) 97 F (36.1 C)   SpO2: 99%       Body mass index is 24.18 kg/m.    ECOG FS:2 - Symptomatic, <50% confined to bed  General: Well-developed, well-nourished, no acute distress.  Sitting in a wheelchair. Eyes: Pink conjunctiva, anicteric sclera. HEENT: Normocephalic, moist mucous membranes. Lungs: No audible wheezing or coughing. Heart: Regular rate and rhythm. Abdomen: Soft, nontender, no obvious distention. Musculoskeletal: No edema, cyanosis, or clubbing. Neuro: Alert, answering all questions appropriately. Cranial nerves grossly intact. Skin: No rashes or petechiae noted. Psych:  Normal affect.  LAB RESULTS:  Lab Results  Component Value Date   NA 138 10/30/2021   K 3.2 (L) 10/30/2021   CL 107 10/30/2021   CO2 29 10/30/2021   GLUCOSE 91 10/30/2021   BUN 18 10/30/2021   CREATININE 0.77 10/30/2021   CALCIUM 8.1 (L) 10/30/2021   PROT 9.2 (H) 10/02/2021   ALBUMIN 2.5 (L) 10/02/2021   AST 22 10/02/2021   ALT 8 10/02/2021   ALKPHOS 25 (L) 10/02/2021   BILITOT 0.3 10/02/2021   GFRNONAA >60 10/30/2021   GFRAA >60 10/13/2019    Lab Results  Component Value Date   WBC 7.0 12/04/2021   NEUTROABS 5.4 12/04/2021   HGB 8.8 (L) 12/04/2021   HCT 28.3 (L) 12/04/2021   MCV 96.3 12/04/2021   PLT 170 12/04/2021     STUDIES: No results found.  ASSESSMENT: Multiple myeloma  PLAN:  Multiple myeloma: Please see note from September 17, 2020 for historic details of his myeloma and treatment.  Since reinitiating daratumumab, patient's M spike trended down to 3.6, but recently has increased back to 3.9.  Today's result is pending.  His most recent IgG has also increased to 5498. Kappa free light chains are stable at 177.5.  We once again discussed discontinuing treatment and entering hospice, but patient has declined.  He expressed understanding that given his performance status more aggressive treatment is not recommended.  Previously, patient was receiving daratumumab every 4 weeks and Zometa every 8 weeks, but patient requested a chemotherapy holiday and did not receive any treatment between January 15, 2021 and July 10, 2021.  Proceed with daratumumab and Zometa today.  Return to clinic in 4 weeks for further evaluation and continuation of treatment.  Appreciate palliative care input.   Thrombocytopenia: Resolved. Back/joint pain: Chronic and unchanged.  MRI results reviewed independently and report as above consistent with progressive myeloma.  No pathologic fractures reported.  Patient has completed palliative XRT.  Continue current narcotics as prescribed.    Peripheral  neuropathy: Chronic and unchanged.  Continue gabapentin as prescribed. Insomnia/anxiety: Chronic and unchanged.  Patient is now having his medications adjusted by primary care.  Fractured left clavicle: Patient does not complain of pain in this area.  Follow-up with orthopedics as indicated. Anemia: Patient hemoglobin is trended down to 8.8, monitor. Hypocalcemia: Given patient's low albumin this corrects to near normal.  Proceed with Zometa as above. Fall/right flank pain: Patient will have pelvic/right hip x-ray today after his treatments.   Patient expressed understanding and was in agreement with this plan. He also understands that He can call clinic at any time with any questions, concerns, or complaints.    Cancer Staging  No matching staging information was found for the patient.  Lloyd Huger, MD   12/04/2021 10:15 AM

## 2021-12-05 LAB — KAPPA/LAMBDA LIGHT CHAINS
Kappa free light chain: 193.9 mg/L — ABNORMAL HIGH (ref 3.3–19.4)
Kappa, lambda light chain ratio: 29.38 — ABNORMAL HIGH (ref 0.26–1.65)
Lambda free light chains: 6.6 mg/L (ref 5.7–26.3)

## 2021-12-06 LAB — IGG, IGA, IGM
IgA: 5 mg/dL — ABNORMAL LOW (ref 61–437)
IgG (Immunoglobin G), Serum: 5422 mg/dL — ABNORMAL HIGH (ref 603–1613)
IgM (Immunoglobulin M), Srm: 9 mg/dL — ABNORMAL LOW (ref 15–143)

## 2021-12-09 ENCOUNTER — Other Ambulatory Visit: Payer: Self-pay

## 2021-12-09 DIAGNOSIS — N400 Enlarged prostate without lower urinary tract symptoms: Secondary | ICD-10-CM

## 2021-12-09 LAB — PROTEIN ELECTROPHORESIS, SERUM
A/G Ratio: 0.4 — ABNORMAL LOW (ref 0.7–1.7)
Albumin ELP: 2.4 g/dL — ABNORMAL LOW (ref 2.9–4.4)
Alpha-1-Globulin: 0.3 g/dL (ref 0.0–0.4)
Alpha-2-Globulin: 0.8 g/dL (ref 0.4–1.0)
Beta Globulin: 0.8 g/dL (ref 0.7–1.3)
Gamma Globulin: 3.9 g/dL — ABNORMAL HIGH (ref 0.4–1.8)
Globulin, Total: 5.8 g/dL — ABNORMAL HIGH (ref 2.2–3.9)
M-Spike, %: 3.9 g/dL — ABNORMAL HIGH
Total Protein ELP: 8.2 g/dL (ref 6.0–8.5)

## 2021-12-09 MED ORDER — TAMSULOSIN HCL 0.4 MG PO CAPS: ORAL_CAPSULE | ORAL | 3 refills | Status: DC

## 2021-12-25 ENCOUNTER — Ambulatory Visit: Payer: Medicare Other | Admitting: Oncology

## 2021-12-25 ENCOUNTER — Ambulatory Visit: Payer: Medicare Other

## 2021-12-25 ENCOUNTER — Other Ambulatory Visit: Payer: Medicare Other

## 2021-12-25 ENCOUNTER — Telehealth: Payer: Self-pay

## 2021-12-25 NOTE — Telephone Encounter (Signed)
Pt called with c/o L ear pain x 1 week. Pt has a difficult time getting to office and asks if ear drops could be sent in for him? Pt denies nasal congestion, cough or fever.

## 2021-12-30 ENCOUNTER — Other Ambulatory Visit: Payer: Self-pay | Admitting: Family Medicine

## 2021-12-30 MED ORDER — NEOMYCIN-POLYMYXIN-HC 3.5-10000-1 OT SOLN: 3.0000 [drp] | Freq: Four times a day (QID) | OTIC | 0 refills | Status: DC

## 2021-12-31 ENCOUNTER — Encounter: Payer: Self-pay | Admitting: Radiation Oncology

## 2022-01-01 ENCOUNTER — Inpatient Hospital Stay: Payer: Medicare Other

## 2022-01-01 ENCOUNTER — Encounter: Payer: Self-pay | Admitting: Oncology

## 2022-01-01 ENCOUNTER — Telehealth: Payer: Self-pay

## 2022-01-01 ENCOUNTER — Inpatient Hospital Stay (HOSPITAL_BASED_OUTPATIENT_CLINIC_OR_DEPARTMENT_OTHER): Payer: Medicare Other | Admitting: Oncology

## 2022-01-01 VITALS — BP 113/53 | HR 66 | Temp 97.6°F | Resp 16 | Ht 68.0 in | Wt 160.0 lb

## 2022-01-01 DIAGNOSIS — C9000 Multiple myeloma not having achieved remission: Secondary | ICD-10-CM

## 2022-01-01 DIAGNOSIS — Z5112 Encounter for antineoplastic immunotherapy: Secondary | ICD-10-CM | POA: Diagnosis not present

## 2022-01-01 LAB — MAGNESIUM: Magnesium: 2 mg/dL (ref 1.7–2.4)

## 2022-01-01 LAB — CBC WITH DIFFERENTIAL/PLATELET
Abs Immature Granulocytes: 0.09 10*3/uL — ABNORMAL HIGH (ref 0.00–0.07)
Basophils Absolute: 0 10*3/uL (ref 0.0–0.1)
Basophils Relative: 0 %
Eosinophils Absolute: 0 10*3/uL (ref 0.0–0.5)
Eosinophils Relative: 0 %
HCT: 32.5 % — ABNORMAL LOW (ref 39.0–52.0)
Hemoglobin: 10.4 g/dL — ABNORMAL LOW (ref 13.0–17.0)
Immature Granulocytes: 1 %
Lymphocytes Relative: 26 %
Lymphs Abs: 1.8 10*3/uL (ref 0.7–4.0)
MCH: 30.6 pg (ref 26.0–34.0)
MCHC: 32 g/dL (ref 30.0–36.0)
MCV: 95.6 fL (ref 80.0–100.0)
Monocytes Absolute: 0.6 10*3/uL (ref 0.1–1.0)
Monocytes Relative: 9 %
Neutro Abs: 4.3 10*3/uL (ref 1.7–7.7)
Neutrophils Relative %: 64 %
Platelets: 175 10*3/uL (ref 150–400)
RBC: 3.4 MIL/uL — ABNORMAL LOW (ref 4.22–5.81)
RDW: 17.1 % — ABNORMAL HIGH (ref 11.5–15.5)
WBC: 6.8 10*3/uL (ref 4.0–10.5)
nRBC: 0 % (ref 0.0–0.2)

## 2022-01-01 LAB — BASIC METABOLIC PANEL
Anion gap: 3 — ABNORMAL LOW (ref 5–15)
BUN: 25 mg/dL — ABNORMAL HIGH (ref 8–23)
CO2: 26 mmol/L (ref 22–32)
Calcium: 7.7 mg/dL — ABNORMAL LOW (ref 8.9–10.3)
Chloride: 105 mmol/L (ref 98–111)
Creatinine, Ser: 0.8 mg/dL (ref 0.61–1.24)
GFR, Estimated: >60 mL/min (ref >60)
Glucose, Bld: 81 mg/dL (ref 70–99)
Potassium: 3.8 mmol/L (ref 3.5–5.1)
Sodium: 134 mmol/L — ABNORMAL LOW (ref 135–145)

## 2022-01-01 MED ORDER — HEPARIN SOD (PORK) LOCK FLUSH 100 UNIT/ML IV SOLN
500.0000 [IU] | Freq: Once | INTRAVENOUS | Status: AC
  Administered 2022-01-01: 500 [IU] via INTRAVENOUS
  Filled 2022-01-01: qty 5

## 2022-01-01 MED ORDER — AMOXICILLIN-POT CLAVULANATE 875-125 MG PO TABS: 1.0000 | ORAL_TABLET | Freq: Two times a day (BID) | ORAL | 0 refills | Status: DC

## 2022-01-01 NOTE — Progress Notes (Incomplete)
Radiation Oncology         [phone]) [phone] ________________________________  Name: Dean [name]. MRN: [id number]  Date: 01/02/2022  DOB: 12/11/1935  Follow-Up Visit Note  CC: [name], MD  [name], MD  No diagnosis found.  Diagnosis: The encounter diagnosis was Multiple myeloma without remission ([place]).   Multiple myeloma without remission with multiple sites of osseous metastases   Interval Since Last Radiation: 1 month and 11 days  8)  Intent: Palliative Radiation Treatment Dates: 11/18/2021 through 11/21/2021 Site Technique Total Dose (Gy) Dose per Fx (Gy) Completed Fx Beam Energies  Hip, Right: Pelvis_R Complex 20/20 5 4/4 15X   7) 8 Gy to the left hip in August 2023 with Dr. Baruch Gouty   6) Diagnosis:   Recurrent Multiple Myeloma    Indication for treatment:  Palliative      Radiation treatment dates:   09/29/17 - 10/14/17 Site/dose:   Ribs, Left/ 30 Gy delivered in 10 fractions of 3 Gy Beams/energy:   Isodose Plan/ 10X   5) Diagnosis: Recurrent multiple myeloma    Indication for treatment: Palliative, pain control     Radiation treatment dates:  02/19/17-02/22/17 Site/dose:  Right temporal Scalp/ 30 Gy in 10 fractions Beams/energy: 3D/ 6X   4) IgG kappa multiple myeloma without remission.  Indication for treatment:  Palliative       Radiation treatment dates:   12/01/16-12/10/16  Site/dose:  1. Parasternal nodule, chest- 24 Gy total delivered in 8 fractions  2. Left sacro-iliac, pelvis- 24 Gy total delivered in 8 fractions  Beams/energy:    1. Isodose Plan Photon, 6X/10X 2. Isodose Plan Photon, 15X   3) Diagnosis:  Multiple myeloma involving the lumbar spine     Indication for treatment:  Significant involvement in the lumbar spine and associated pain      Radiation treatment dates:   March 16 through May 02 2014 Site/dose:   Lumbar spine and 25 gray in 10 fractions Beams/energy:   AP PA, 3-D conformal, 10 and 15 x beams    2) Diagnosis: Multiple myeloma  Indication for treatment:  Painful soft tissue/chest wall mass      Radiation treatment dates:   January 19 through March 08 2014 Site/dose:  Right posterior chest wall area, 30 gray in 10 fractions Beams/energy:   3-D conformal, 15x, 6x   1) Diagnosis:  Left occipital condyle infiltrative lesion, occipital condyle syndrome Indication for treatment:  Pain and neurologic compromise Radiation treatment dates:   December 3 through February 10 2013 Site/dose:   Left occipital condyle region, 45 Gy in 25 fractions Beams/energy:   3 field conformal setup, 10 and 15 MV photons   Narrative:  The patient returns today for routine follow-up. The patient tolerated radiation therapy relatively well. During his final weekly treatment check on 11/19/21, the patient reported fatigue. He denied any other concerns, and endorsed improvement in his pain along the right pelvis area particularly with standing and moving around.                Since completing radiation, the patient followed up with [name] on 12/04/21. During this visit, the patient reported having several more falls that past week, increased right flank/hip pain, ongoing bilateral hip pain, poor appetite, and chronic weakness and fatigue. He denied any other concerns and was cleared to proceed with his daratumumab and Zometa infusions that day. (X-ray of the right hip performed that day demonstrated the multiple known small, oval, cortical lucencies  in the proximal right femur compatible with multiple myeloma, as well as severe right hip degenerative changes. Imaging otherwise showed no evidence of fracture or dislocation).    During his most recent follow-up visit with [name] yesterday (01/01/22), the patient endorsed no change in his symptoms (noted above). He did report a recently diagnosed ear infection which did not improve with abx. He also reported that his symptoms became worse, detailed by new jaw  and neck pain. Given his symptomatic ear infection, his treatment will be held for 1 week, and he was given a prescription for Augmentin. When he resumes treatment next week, he will follow-up with [name] to review his myeloma labs which are pending at this time.  ***                Allergies:  is allergic to codeine, morphine and related, and revlimid [lenalidomide].  Meds: Current Outpatient Medications  Medication Sig Dispense Refill   acyclovir (ZOVIRAX) 400 MG tablet TAKE 1 TABLET BY MOUTH TWICE A DAY 60 tablet 6   ALPRAZolam (XANAX) 1 MG tablet TAKE 1 TABLET BY MOUTH 3 TIMES DAILY AS NEEDED FOR ANXIETY 90 tablet 0   amoxicillin-clavulanate (AUGMENTIN) 875-125 MG tablet Take 1 tablet by mouth 2 (two) times daily. 14 tablet 0   CALCIUM-VITAMIN D PO Take 1 tablet by mouth 2 (two) times daily.      cetirizine (ZYRTEC) 10 MG tablet Take 1 tablet (10 mg total) by mouth daily. 30 tablet 0   dexamethasone (DECADRON) 4 MG tablet      dexamethasone (DECADRON) 4 MG tablet TAKE 1 TABLET BY MOUTH ONCE A DAY 30 tablet 0   fluticasone (FLONASE) 50 MCG/ACT nasal spray Place 2 sprays into both nostrils daily. 16 g 6   furosemide (LASIX) 20 MG tablet TAKE 1 TABLET BY MOUTH ONCE A DAY AS NEEDED 30 tablet 3   gabapentin (NEURONTIN) 400 MG capsule TAKE 1 CAPSULE BY MOUTH 3 TIMES A DAY 90 capsule 4   Homeopathic Products (THERAWORX RELIEF EX) Apply topically as needed.     ibuprofen (ADVIL) 600 MG tablet Take 1 tablet (600 mg total) by mouth every 8 (eight) hours as needed. 30 tablet 0   lidocaine-prilocaine (EMLA) cream Apply small amount over port one (1) hour prior to appointment. 30 g 0   montelukast (SINGULAIR) 10 MG tablet TAKE 1 TABLET BY MOUTH EVERY NIGHT AT BEDTIME 30 tablet 3   naloxone (NARCAN) nasal spray 4 mg/0.1 mL SPRAY 1 SPRAY INTO ONE NOSTRIL AS DIRECTED FOR OPIOID OVERDOSE (TURN PERSON ON SIDE AFTER DOSE. IF NO RESPONSE IN 2-3 MINUTES OR PERSON RESPONDS BUT RELAPSES, REPEAT USING A NEW  SPRAY DEVICE AND SPRAY INTO THE OTHER NOSTRIL. CALL 911 AFTER USE.) * EMERGENCY USE ONLY * 1 each 0   neomycin-polymyxin-hydrocortisone (CORTISPORIN) OTIC solution Place 3 drops into the left ear 4 (four) times daily. 10 mL 0   omeprazole (PRILOSEC) 20 MG capsule TAKE 1 CAPSULE BY MOUTH ONCE DAILY 30 capsule 5   oxybutynin (DITROPAN-XL) 10 MG 24 hr tablet TAKE 1 TABLET BY MOUTH EVERY NIGHT AT BEDTIME 90 tablet 0   Oxycodone HCl 10 MG TABS TAKE 1 TO 2 TABLETS BY MOUTH 3 TIMES DAILY AS NEEDED 90 tablet 0   predniSONE (DELTASONE) 20 MG tablet Take 2 tablets (40 mg total) by mouth daily with breakfast. 10 tablet 0   senna-docusate (SENOKOT-S) 8.6-50 MG tablet Take 1 tablet by mouth 2 (two) times daily. 60 tablet 5  silver sulfADIAZINE (SILVADENE) 1 % cream Apply 1 application topically daily. 50 g 0   tamsulosin (FLOMAX) 0.4 MG CAPS capsule TAKE ONE CAPSULE BY MOUTH EVERY NIGHT ATBEDTIME 30 capsule 3   traZODone (DESYREL) 100 MG tablet TAKE 3 TABLETS BY MOUTH AT BEDTIME 90 tablet 1   triamcinolone cream (KENALOG) 0.1 % APPLY 1 APPLICATION TOPICALLY TWICE A DAY 30 g 0   TURMERIC PO Take by mouth.     zolendronic acid (ZOMETA) 4 MG/5ML injection Inject 4 mg into the vein every 8 (eight) weeks.     zolpidem (AMBIEN) 10 MG tablet Take 1 tablet (10 mg total) by mouth at bedtime as needed for sleep (do not take with xanax). 30 tablet 2   Current Facility-Administered Medications  Medication Dose Route Frequency Provider Last Rate Last Admin   0.9 %  sodium chloride infusion   Intravenous Once Eulogio Bear, NP       Facility-Administered Medications Ordered in Other Encounters  Medication Dose Route Frequency Provider Last Rate Last Admin   heparin lock flush 100 unit/mL  500 Units Intravenous Once Lloyd Huger, MD        Physical Findings: The patient is in no acute distress. Patient is alert and oriented.  vitals were not taken for this visit. .  No significant changes. Lungs are  clear to auscultation bilaterally. Heart has regular rate and rhythm. No palpable cervical, supraclavicular, or axillary adenopathy. Abdomen soft, non-tender, normal bowel sounds.   Lab Findings: Lab Results  Component Value Date   WBC 6.8 01/01/2022   HGB 10.4 (L) 01/01/2022   HCT 32.5 (L) 01/01/2022   MCV 95.6 01/01/2022   PLT 175 01/01/2022    Radiographic Findings: DG HIP UNILAT WITH PELVIS 2-3 VIEWS RIGHT  Result Date: 12/06/2021 CLINICAL DATA:  Right hip pain following a fall 1 week ago. Multiple recent falls. Multiple myeloma. EXAM: DG HIP (WITH OR WITHOUT PELVIS) 2-3V RIGHT COMPARISON:  01/15/2021 FINDINGS: Right hip degenerative changes with severe superior joint space narrowing without significant change. There is some associated bony remodeling. No fracture or dislocation seen. Diffuse osteopenia. Lower lumbar spine degenerative changes. Multiple interval small, oval, cortical lucencies in the proximal right femur. Extensive atheromatous arterial calcifications. IMPRESSION: 1. No fracture or dislocation. 2. Severe right hip degenerative changes. 3. Multiple interval small, oval, cortical lucencies in the proximal right femur. These are compatible with the patient's known multiple myeloma. Electronically Signed   By: Claudie Revering M.D.   On: 12/06/2021 22:10    Impression:  The encounter diagnosis was Multiple myeloma without remission ([place]).   Multiple myeloma without remission with multiple sites of osseous metastases   The patient is recovering from the effects of radiation.  ***  Plan:  ***   *** minutes of total time was spent for this patient encounter, including preparation, face-to-face counseling with the patient and coordination of care, physical exam, and documentation of the encounter. ____________________________________  Blair Promise, PhD, MD  This document serves as a record of services personally performed by Gery Pray, MD. It was created on his behalf  by Roney Mans, a trained medical scribe. The creation of this record is based on the scribe's personal observations and the provider's statements to them. This document has been checked and approved by the attending provider.

## 2022-01-01 NOTE — Progress Notes (Incomplete)
  Radiation Oncology         [phone] ________________________________  Patient Name: Dean [name] MRN: [id number] DOB: 04-20-1935 Referring Physician: [name] (Profile Not Attached) Date of Service: 11/21/2021 [place], [place]                                                        End Of Treatment Note  Diagnoses: 044.90-Disorder of bone and cartilage unspecified 733.90-Disorder of bone and cartilage unspecified C90.00-Multiple myeloma not having achieved remission C90.00-Multiple myeloma not having achieved remission M89.9-Disorder of bone, unspecified M89.9-Disorder of bone, unspecified  Cancer Staging: The encounter diagnosis was Multiple myeloma without remission ([place]).   Multiple myeloma without remission with multiple sites of osseous metastases   Intent: Palliative  Radiation Treatment Dates: 11/18/2021 through 11/21/2021 Site Technique Total Dose (Gy) Dose per Fx (Gy) Completed Fx Beam Energies  Hip, Right: Pelvis_R Complex 20/20 5 4/4 15X   Narrative: The patient tolerated radiation therapy relatively well. During his final weekly treatment check on 11/19/21, the patient reported fatigue. He denied any other concerns, and endorsed improvement in his pain along the right pelvis area particularly with standing and moving around.   Plan: The patient will follow-up with radiation oncology in one month .  ________________________________________________ -----------------------------------  Blair Promise, PhD, MD  This document serves as a record of services personally performed by Gery Pray, MD. It was created on his behalf by Roney Mans, a trained medical scribe. The creation of this record is based on the scribe's personal observations and the provider's statements to them. This document has been checked and approved by the attending provider.

## 2022-01-01 NOTE — Patient Instructions (Signed)

## 2022-01-01 NOTE — Progress Notes (Signed)
Has a ear infection that he is currently on antibiotics for. Having some swelling in right side of face and has pain in his jaw on right side where ear infection is. Had a fall 2 weeks ago and fell back and hit the back of his head. Was never seen in the ED. Has a knot on the back of head.

## 2022-01-01 NOTE — Progress Notes (Signed)
[name]  Telephone:([phone] [phone]  ID: Dean [name]. OB: 02-Aug-1935  MR#: [id number]  ZDG#:[id number]  Patient Care Team: [name], MD as PCP - General (Family Medicine) Gery Pray, MD as Consulting Physician (Radiation Oncology) Danie Binder, MD (Inactive) as Consulting Physician (Gastroenterology) Michael Boston, MD as Consulting Physician (General Surgery) Lloyd Huger, MD as Consulting Physician (Oncology)  CHIEF COMPLAINT: Multiple myeloma  INTERVAL HISTORY: Patient returns to clinic today for repeat laboratory work, further evaluation, and continuation of daratumumab and Zometa.  He continues to have periodic falls at home, but did not seek medical attention.  He was recently diagnosed with an ear infection, but despite topical antibiotics feels like his symptoms are becoming worse with jaw and neck pain.  He denies any fevers.  He continues to have chronic weakness and fatigue.  He continues to have bilateral hip pain.  He has no neurologic complaints.  He denies any recent fevers or illnesses.  He has a poor appetite.  He has no chest pain, shortness of breath, cough, or hemoptysis.  He denies any nausea, vomiting, constipation, or diarrhea.  He has no urinary complaints.  Patient offers no furthercomplaints today.  REVIEW OF SYSTEMS:   Review of Systems  Constitutional:  Positive for malaise/fatigue. Negative for fever and weight loss.  Respiratory: Negative.  Negative for cough, hemoptysis and shortness of breath.   Cardiovascular: Negative.  Negative for chest pain and leg swelling.  Gastrointestinal: Negative.  Negative for abdominal pain and nausea.  Genitourinary:  Positive for flank pain. Negative for dysuria.  Musculoskeletal:  Positive for back pain, falls and joint pain.  Skin: Negative.  Negative for rash.  Neurological:  Positive for sensory change and weakness. Negative for dizziness, focal weakness and  headaches.  Psychiatric/Behavioral: Negative.  The patient is not nervous/anxious and does not have insomnia.     As per HPI. Otherwise, a complete review of systems is negative.  PAST MEDICAL HISTORY: Past Medical History:  Diagnosis Date   BPH (benign prostatic hyperplasia)    Colonic diverticular abscess 01/08/2015   Diverticulitis 95/18/8416   complicated by abscess and required percutaneous drainage   GERD (gastroesophageal reflux disease)    H/O ETOH abuse    History of chemotherapy last done jan 2017   History of radiation therapy 01/05/13-02/10/13   45 gray to left occipital condyle region   History of radiation therapy 12/01/16-12/10/16   Parasternal nodule, chest- 24 Gy total delivered in 8 fractions, Left sacro-iliac, pelvis- 24 Gy total delivered in 8 fractions    History of radiation therapy 02/19/17-02/22/17   right temporal scalp 30 Gy in 10 fractions   History of radiation therapy    Right hip( Pelvis) 11/18/21-11/21/21-Dr. Gery Pray   Intra-abdominal abscess ([place])    Multiple myeloma ([place]) 2015   Neuropathy    Radiation 02/21/14-03/08/14   right posterior chest wall area 30 gray   Radiation 04/19/14-05/02/14   lumbar spine 25 gray   Skull lesion    Left occipital condyle   Wrist fracture, left    x 2    PAST SURGICAL HISTORY: Past Surgical History:  Procedure Laterality Date   COLONOSCOPY N/A 04/19/2015   Procedure: COLONOSCOPY;  Surgeon: Danie Binder, MD;  Location: [facility];  Service: Endoscopy;  Laterality: N/A;  1:30 PM   CYST EXCISION  1959   tail bone   IR IMAGING GUIDED PORT INSERTION  09/25/2017    FAMILY HISTORY: Family  History  Problem Relation Age of Onset   Diabetes Mother    Stroke Mother    Hypertension Father     ADVANCED DIRECTIVES (Y/N):  N  HEALTH MAINTENANCE: Social History   Tobacco Use   Smoking status: Never   Smokeless tobacco: Never  Vaping Use   Vaping Use: Never used  Substance Use Topics   Alcohol use: No     Comment: " Not much no more"   Drug use: No     Colonoscopy:  PAP:  Bone density:  Lipid panel:  Allergies  Allergen Reactions   Codeine Other (See Comments)    Headache   Morphine And Related Other (See Comments)    Makes him feel weird   Revlimid [Lenalidomide] Other (See Comments)    "Causes me to become weak"    Current Outpatient Medications  Medication Sig Dispense Refill   acyclovir (ZOVIRAX) 400 MG tablet TAKE 1 TABLET BY MOUTH TWICE A DAY 60 tablet 6   ALPRAZolam (XANAX) 1 MG tablet TAKE 1 TABLET BY MOUTH 3 TIMES DAILY AS NEEDED FOR ANXIETY 90 tablet 0   amoxicillin-clavulanate (AUGMENTIN) 875-125 MG tablet Take 1 tablet by mouth 2 (two) times daily. 14 tablet 0   CALCIUM-VITAMIN D PO Take 1 tablet by mouth 2 (two) times daily.      cetirizine (ZYRTEC) 10 MG tablet Take 1 tablet (10 mg total) by mouth daily. 30 tablet 0   dexamethasone (DECADRON) 4 MG tablet      dexamethasone (DECADRON) 4 MG tablet TAKE 1 TABLET BY MOUTH ONCE A DAY 30 tablet 0   fluticasone (FLONASE) 50 MCG/ACT nasal spray Place 2 sprays into both nostrils daily. 16 g 6   furosemide (LASIX) 20 MG tablet TAKE 1 TABLET BY MOUTH ONCE A DAY AS NEEDED 30 tablet 3   gabapentin (NEURONTIN) 400 MG capsule TAKE 1 CAPSULE BY MOUTH 3 TIMES A DAY 90 capsule 4   Homeopathic Products (THERAWORX RELIEF EX) Apply topically as needed.     ibuprofen (ADVIL) 600 MG tablet Take 1 tablet (600 mg total) by mouth every 8 (eight) hours as needed. 30 tablet 0   lidocaine-prilocaine (EMLA) cream Apply small amount over port one (1) hour prior to appointment. 30 g 0   montelukast (SINGULAIR) 10 MG tablet TAKE 1 TABLET BY MOUTH EVERY NIGHT AT BEDTIME 30 tablet 3   naloxone (NARCAN) nasal spray 4 mg/0.1 mL SPRAY 1 SPRAY INTO ONE NOSTRIL AS DIRECTED FOR OPIOID OVERDOSE (TURN PERSON ON SIDE AFTER DOSE. IF NO RESPONSE IN 2-3 MINUTES OR PERSON RESPONDS BUT RELAPSES, REPEAT USING A NEW SPRAY DEVICE AND SPRAY INTO THE OTHER NOSTRIL. CALL  911 AFTER USE.) * EMERGENCY USE ONLY * 1 each 0   neomycin-polymyxin-hydrocortisone (CORTISPORIN) OTIC solution Place 3 drops into the left ear 4 (four) times daily. 10 mL 0   omeprazole (PRILOSEC) 20 MG capsule TAKE 1 CAPSULE BY MOUTH ONCE DAILY 30 capsule 5   oxybutynin (DITROPAN-XL) 10 MG 24 hr tablet TAKE 1 TABLET BY MOUTH EVERY NIGHT AT BEDTIME 90 tablet 0   Oxycodone HCl 10 MG TABS TAKE 1 TO 2 TABLETS BY MOUTH 3 TIMES DAILY AS NEEDED 90 tablet 0   predniSONE (DELTASONE) 20 MG tablet Take 2 tablets (40 mg total) by mouth daily with breakfast. 10 tablet 0   senna-docusate (SENOKOT-S) 8.6-50 MG tablet Take 1 tablet by mouth 2 (two) times daily. 60 tablet 5   silver sulfADIAZINE (SILVADENE) 1 % cream Apply 1 application topically  daily. 50 g 0   tamsulosin (FLOMAX) 0.4 MG CAPS capsule TAKE ONE CAPSULE BY MOUTH EVERY NIGHT ATBEDTIME 30 capsule 3   traZODone (DESYREL) 100 MG tablet TAKE 3 TABLETS BY MOUTH AT BEDTIME 90 tablet 1   triamcinolone cream (KENALOG) 0.1 % APPLY 1 APPLICATION TOPICALLY TWICE A DAY 30 g 0   TURMERIC PO Take by mouth.     zolendronic acid (ZOMETA) 4 MG/5ML injection Inject 4 mg into the vein every 8 (eight) weeks.     zolpidem (AMBIEN) 10 MG tablet Take 1 tablet (10 mg total) by mouth at bedtime as needed for sleep (do not take with xanax). 30 tablet 2   Current Facility-Administered Medications  Medication Dose Route Frequency Provider Last Rate Last Admin   0.9 %  sodium chloride infusion   Intravenous Once Eulogio Bear, NP       Facility-Administered Medications Ordered in Other Visits  Medication Dose Route Frequency Provider Last Rate Last Admin   heparin lock flush 100 unit/mL  500 Units Intravenous Once Lloyd Huger, MD        OBJECTIVE: Vitals:   01/01/22 0853  BP: (!) 113/53  Pulse: 66  Resp: 16  Temp: 97.6 F (36.4 C)  SpO2: 97%      Body mass index is 24.33 kg/m.    ECOG FS:2 - Symptomatic, <50% confined to bed  General:  Well-developed, well-nourished, no acute distress.  Sitting in a wheelchair. Eyes: Pink conjunctiva, anicteric sclera. HEENT: Normocephalic, moist mucous membranes. Lungs: No audible wheezing or coughing. Heart: Regular rate and rhythm. Abdomen: Soft, nontender, no obvious distention. Musculoskeletal: No edema, cyanosis, or clubbing. Neuro: Alert, answering all questions appropriately. Cranial nerves grossly intact. Skin: No rashes or petechiae noted. Psych: Normal affect.  LAB RESULTS:  Lab Results  Component Value Date   NA 134 (L) 01/01/2022   K 3.8 01/01/2022   CL 105 01/01/2022   CO2 26 01/01/2022   GLUCOSE 81 01/01/2022   BUN 25 (H) 01/01/2022   CREATININE 0.80 01/01/2022   CALCIUM 7.7 (L) 01/01/2022   PROT 8.7 (H) 12/04/2021   ALBUMIN 1.8 (L) 12/04/2021   AST 26 12/04/2021   ALT 12 12/04/2021   ALKPHOS 26 (L) 12/04/2021   BILITOT 0.2 (L) 12/04/2021   GFRNONAA >60 01/01/2022   GFRAA >60 10/13/2019    Lab Results  Component Value Date   WBC 6.8 01/01/2022   NEUTROABS 4.3 01/01/2022   HGB 10.4 (L) 01/01/2022   HCT 32.5 (L) 01/01/2022   MCV 95.6 01/01/2022   PLT 175 01/01/2022     STUDIES: DG HIP UNILAT WITH PELVIS 2-3 VIEWS RIGHT  Result Date: 12/06/2021 CLINICAL DATA:  Right hip pain following a fall 1 week ago. Multiple recent falls. Multiple myeloma. EXAM: DG HIP (WITH OR WITHOUT PELVIS) 2-3V RIGHT COMPARISON:  01/15/2021 FINDINGS: Right hip degenerative changes with severe superior joint space narrowing without significant change. There is some associated bony remodeling. No fracture or dislocation seen. Diffuse osteopenia. Lower lumbar spine degenerative changes. Multiple interval small, oval, cortical lucencies in the proximal right femur. Extensive atheromatous arterial calcifications. IMPRESSION: 1. No fracture or dislocation. 2. Severe right hip degenerative changes. 3. Multiple interval small, oval, cortical lucencies in the proximal right femur. These are  compatible with the patient's known multiple myeloma. Electronically Signed   By: Claudie Revering M.D.   On: 12/06/2021 22:10    ASSESSMENT: Multiple myeloma  PLAN:  Multiple myeloma: Please see note from September 17, 2020  for historic details of his myeloma and treatment.  Since reinitiating daratumumab, patient's M spike trended down to 3.6, but recently has increased back to 3.9.  Today's result is pending.  His most recent IgG has also increased to 5498. Kappa free light chains are stable at 177.5.  Given his symptomatic ear infection, will delay treatment 1 week at which time his myeloma labs will have resulted.  We discussed the possibility of discontinuing treatment altogether or possibly switching treatments if there is progressive disease.  Previously hospice was discussed as well.  Previously, patient was receiving daratumumab every 4 weeks and Zometa every 8 weeks, but patient requested a chemotherapy holiday and did not receive any treatment between January 15, 2021 and July 10, 2021.  Return to clinic in 1 week for discussion of whether or not to continue treatment. Thrombocytopenia: Resolved. Back/joint pain: Chronic and unchanged.  MRI results reviewed independently consistent with progressive myeloma.  No pathologic fractures reported.  Patient has completed palliative XRT.  Continue current narcotics as prescribed.    Peripheral neuropathy: Chronic and unchanged.  Continue gabapentin as prescribed. Insomnia/anxiety: Chronic and unchanged.  Patient is now having his medications adjusted by primary care.   Fractured left clavicle: Patient does not complain of pain in this area.  Follow-up with orthopedics as indicated. Anemia: Hemoglobin improved to 9.4. Hypocalcemia: Given patient's low albumin this corrects to near normal.   Ear infection: Patient was given a prescription for Augmentin today.  Patient expressed understanding and was in agreement with this plan. He also understands that He  can call clinic at any time with any questions, concerns, or complaints.    Cancer Staging  No matching staging information was found for the patient.  Lloyd Huger, MD   01/01/2022 2:37 PM

## 2022-01-01 NOTE — Telephone Encounter (Signed)
Pt called requesting a refill on his Ambien. Pt asks if it is possible for him to get a 2 month supply? Last RF was  11/12/2021.

## 2022-01-02 ENCOUNTER — Telehealth: Payer: Self-pay | Admitting: *Deleted

## 2022-01-02 ENCOUNTER — Ambulatory Visit
Admission: RE | Admit: 2022-01-02 | Discharge: 2022-01-02 | Disposition: A | Discharge status: Home / Self Care | Payer: Medicare Other | Source: Ambulatory Visit | Attending: Radiation Oncology | Admitting: Radiation Oncology

## 2022-01-02 ENCOUNTER — Other Ambulatory Visit: Payer: Self-pay | Admitting: Family Medicine

## 2022-01-02 LAB — KAPPA/LAMBDA LIGHT CHAINS
Kappa free light chain: 185.7 mg/L — ABNORMAL HIGH (ref 3.3–19.4)
Kappa, lambda light chain ratio: 84.41 — ABNORMAL HIGH (ref 0.26–1.65)
Lambda free light chains: 2.2 mg/L — ABNORMAL LOW (ref 5.7–26.3)

## 2022-01-02 MED ORDER — ZOLPIDEM TARTRATE 10 MG PO TABS: 10.0000 mg | ORAL_TABLET | Freq: Every evening | ORAL | 2 refills | Status: DC | PRN

## 2022-01-02 NOTE — Telephone Encounter (Signed)
Returned patient's phone call, spoke with patient 

## 2022-01-03 LAB — IGG, IGA, IGM
IgA: <5 mg/dL — ABNORMAL LOW (ref 61–437)
IgG (Immunoglobin G), Serum: 6201 mg/dL — ABNORMAL HIGH (ref 603–1613)
IgM (Immunoglobulin M), Srm: 10 mg/dL — ABNORMAL LOW (ref 15–143)

## 2022-01-03 LAB — PROTEIN ELECTROPHORESIS, SERUM
A/G Ratio: 0.4 — ABNORMAL LOW (ref 0.7–1.7)
Albumin ELP: 2.7 g/dL — ABNORMAL LOW (ref 2.9–4.4)
Alpha-1-Globulin: 0.3 g/dL (ref 0.0–0.4)
Alpha-2-Globulin: 0.8 g/dL (ref 0.4–1.0)
Beta Globulin: 0.9 g/dL (ref 0.7–1.3)
Gamma Globulin: 5 g/dL — ABNORMAL HIGH (ref 0.4–1.8)
Globulin, Total: 7 g/dL — ABNORMAL HIGH (ref 2.2–3.9)
M-Spike, %: 4.2 g/dL — ABNORMAL HIGH
Total Protein ELP: 9.7 g/dL — ABNORMAL HIGH (ref 6.0–8.5)

## 2022-01-08 ENCOUNTER — Encounter: Payer: Self-pay | Admitting: Oncology

## 2022-01-08 ENCOUNTER — Other Ambulatory Visit: Payer: Medicare Other

## 2022-01-08 ENCOUNTER — Other Ambulatory Visit: Payer: Self-pay | Admitting: Oncology

## 2022-01-08 ENCOUNTER — Encounter: Payer: Medicare Other | Admitting: Hospice and Palliative Medicine

## 2022-01-08 ENCOUNTER — Inpatient Hospital Stay (HOSPITAL_BASED_OUTPATIENT_CLINIC_OR_DEPARTMENT_OTHER): Payer: Medicare Other | Admitting: Hospice and Palliative Medicine

## 2022-01-08 ENCOUNTER — Ambulatory Visit: Payer: Medicare Other

## 2022-01-08 ENCOUNTER — Other Ambulatory Visit (HOSPITAL_COMMUNITY): Payer: Self-pay

## 2022-01-08 ENCOUNTER — Telehealth: Payer: Self-pay

## 2022-01-08 ENCOUNTER — Inpatient Hospital Stay: Payer: Medicare Other

## 2022-01-08 ENCOUNTER — Encounter (HOSPITAL_COMMUNITY): Payer: Self-pay | Admitting: Oncology

## 2022-01-08 ENCOUNTER — Telehealth: Payer: Self-pay | Admitting: *Deleted

## 2022-01-08 ENCOUNTER — Inpatient Hospital Stay (HOSPITAL_BASED_OUTPATIENT_CLINIC_OR_DEPARTMENT_OTHER): Payer: Medicare Other | Admitting: Oncology

## 2022-01-08 ENCOUNTER — Inpatient Hospital Stay: Payer: Medicare Other | Attending: Oncology

## 2022-01-08 ENCOUNTER — Ambulatory Visit: Payer: Medicare Other | Admitting: Oncology

## 2022-01-08 ENCOUNTER — Telehealth: Payer: Self-pay | Admitting: Pharmacist

## 2022-01-08 DIAGNOSIS — C9000 Multiple myeloma not having achieved remission: Secondary | ICD-10-CM

## 2022-01-08 DIAGNOSIS — R5383 Other fatigue: Secondary | ICD-10-CM | POA: Insufficient documentation

## 2022-01-08 DIAGNOSIS — Z515 Encounter for palliative care: Secondary | ICD-10-CM | POA: Diagnosis not present

## 2022-01-08 DIAGNOSIS — F0390 Unspecified dementia without behavioral disturbance: Secondary | ICD-10-CM | POA: Insufficient documentation

## 2022-01-08 DIAGNOSIS — Z9181 History of falling: Secondary | ICD-10-CM | POA: Insufficient documentation

## 2022-01-08 DIAGNOSIS — G4700 Insomnia, unspecified: Secondary | ICD-10-CM

## 2022-01-08 DIAGNOSIS — R531 Weakness: Secondary | ICD-10-CM | POA: Diagnosis not present

## 2022-01-08 LAB — CBC WITH DIFFERENTIAL/PLATELET
Abs Immature Granulocytes: 0.07 10*3/uL (ref 0.00–0.07)
Basophils Absolute: 0 10*3/uL (ref 0.0–0.1)
Basophils Relative: 0 %
Eosinophils Absolute: 0 10*3/uL (ref 0.0–0.5)
Eosinophils Relative: 0 %
HCT: 32.1 % — ABNORMAL LOW (ref 39.0–52.0)
Hemoglobin: 10.1 g/dL — ABNORMAL LOW (ref 13.0–17.0)
Immature Granulocytes: 1 %
Lymphocytes Relative: 19 %
Lymphs Abs: 1.1 10*3/uL (ref 0.7–4.0)
MCH: 30.1 pg (ref 26.0–34.0)
MCHC: 31.5 g/dL (ref 30.0–36.0)
MCV: 95.8 fL (ref 80.0–100.0)
Monocytes Absolute: 0.4 10*3/uL (ref 0.1–1.0)
Monocytes Relative: 6 %
Neutro Abs: 4.5 10*3/uL (ref 1.7–7.7)
Neutrophils Relative %: 74 %
Platelets: 149 10*3/uL — ABNORMAL LOW (ref 150–400)
RBC: 3.35 MIL/uL — ABNORMAL LOW (ref 4.22–5.81)
RDW: 16.7 % — ABNORMAL HIGH (ref 11.5–15.5)
WBC: 6.1 10*3/uL (ref 4.0–10.5)
nRBC: 0 % (ref 0.0–0.2)

## 2022-01-08 LAB — BASIC METABOLIC PANEL
Anion gap: 1 — ABNORMAL LOW (ref 5–15)
BUN: 29 mg/dL — ABNORMAL HIGH (ref 8–23)
CO2: 27 mmol/L (ref 22–32)
Calcium: 7.8 mg/dL — ABNORMAL LOW (ref 8.9–10.3)
Chloride: 103 mmol/L (ref 98–111)
Creatinine, Ser: 0.75 mg/dL (ref 0.61–1.24)
GFR, Estimated: >60 mL/min (ref >60)
Glucose, Bld: 108 mg/dL — ABNORMAL HIGH (ref 70–99)
Potassium: 4.1 mmol/L (ref 3.5–5.1)
Sodium: 131 mmol/L — ABNORMAL LOW (ref 135–145)

## 2022-01-08 LAB — MAGNESIUM: Magnesium: 2 mg/dL (ref 1.7–2.4)

## 2022-01-08 MED ORDER — TRAZODONE HCL 100 MG PO TABS: 300.0000 mg | ORAL_TABLET | Freq: Every day | ORAL | 1 refills | Status: DC

## 2022-01-08 MED ORDER — AMOXICILLIN-POT CLAVULANATE 875-125 MG PO TABS: 1.0000 | ORAL_TABLET | Freq: Two times a day (BID) | ORAL | 0 refills | Status: DC

## 2022-01-08 MED ORDER — NEOMYCIN-POLYMYXIN-HC 3.5-10000-1 OT SOLN: 3.0000 [drp] | Freq: Four times a day (QID) | OTIC | 0 refills | Status: DC

## 2022-01-08 NOTE — Telephone Encounter (Signed)
Refills

## 2022-01-08 NOTE — Progress Notes (Signed)
[place]  Telephone:([phone] [phone]  ID: Dean [name]. OB: 1935-03-28  MR#: [id number]  GDJ#:[id number]  Patient Care Team: [name], MD as PCP - General (Family Medicine) Gery Pray, MD as Consulting Physician (Radiation Oncology) Danie Binder, MD (Inactive) as Consulting Physician (Gastroenterology) Michael Boston, MD as Consulting Physician (General Surgery) Lloyd Huger, MD as Consulting Physician (Oncology)  CHIEF COMPLAINT: Multiple myeloma  INTERVAL HISTORY: Patient returns to clinic today for further evaluation and discussion on whether or not to continue treatment.  He continues to have increasing weakness and fatigue with a decreased performance status.  He once again had a fall this past week and was on the floor overnight.  He did not seek medical attention.  His ear and neck pain have improved with antibiotics.  He denies any fevers. He continues to have bilateral hip pain.  He has no neurologic complaints.  He denies any recent fevers or illnesses.  He has a poor appetite.  He has no chest pain, shortness of breath, cough, or hemoptysis.  He denies any nausea, vomiting, constipation, or diarrhea.  He has no urinary complaints.  Patient offers no further specific complaints today.  REVIEW OF SYSTEMS:   Review of Systems  Constitutional:  Positive for malaise/fatigue. Negative for fever and weight loss.  Respiratory: Negative.  Negative for cough, hemoptysis and shortness of breath.   Cardiovascular: Negative.  Negative for chest pain and leg swelling.  Gastrointestinal: Negative.  Negative for abdominal pain and nausea.  Genitourinary:  Positive for flank pain. Negative for dysuria.  Musculoskeletal:  Positive for back pain, falls and joint pain.  Skin: Negative.  Negative for rash.  Neurological:  Positive for sensory change and weakness. Negative for dizziness, focal weakness and headaches.   Psychiatric/Behavioral: Negative.  The patient is not nervous/anxious and does not have insomnia.     As per HPI. Otherwise, a complete review of systems is negative.  PAST MEDICAL HISTORY: Past Medical History:  Diagnosis Date   BPH (benign prostatic hyperplasia)    Colonic diverticular abscess 01/08/2015   Diverticulitis 62/22/9798   complicated by abscess and required percutaneous drainage   GERD (gastroesophageal reflux disease)    H/O ETOH abuse    History of chemotherapy last done jan 2017   History of radiation therapy 01/05/13-02/10/13   45 gray to left occipital condyle region   History of radiation therapy 12/01/16-12/10/16   Parasternal nodule, chest- 24 Gy total delivered in 8 fractions, Left sacro-iliac, pelvis- 24 Gy total delivered in 8 fractions    History of radiation therapy 02/19/17-02/22/17   right temporal scalp 30 Gy in 10 fractions   History of radiation therapy    Right hip( Pelvis) 11/18/21-11/21/21-Dr. Gery Pray   Intra-abdominal abscess ([place])    Multiple myeloma ([place]) 2015   Neuropathy    Radiation 02/21/14-03/08/14   right posterior chest wall area 30 gray   Radiation 04/19/14-05/02/14   lumbar spine 25 gray   Skull lesion    Left occipital condyle   Wrist fracture, left    x 2    PAST SURGICAL HISTORY: Past Surgical History:  Procedure Laterality Date   COLONOSCOPY N/A 04/19/2015   Procedure: COLONOSCOPY;  Surgeon: Danie Binder, MD;  Location: [facility];  Service: Endoscopy;  Laterality: N/A;  1:30 PM   CYST EXCISION  1959   tail bone   IR IMAGING GUIDED PORT INSERTION  09/25/2017    FAMILY HISTORY: Family History  Problem Relation Age of Onset   Diabetes Mother    Stroke Mother    Hypertension Father     ADVANCED DIRECTIVES (Y/N):  N  HEALTH MAINTENANCE: Social History   Tobacco Use   Smoking status: Never   Smokeless tobacco: Never  Vaping Use   Vaping Use: Never used  Substance Use Topics   Alcohol use: No    Comment:  " Not much no more"   Drug use: No     Colonoscopy:  PAP:  Bone density:  Lipid panel:  Allergies  Allergen Reactions   Codeine Other (See Comments)    Headache   Morphine And Related Other (See Comments)    Makes him feel weird   Revlimid [Lenalidomide] Other (See Comments)    "Causes me to become weak"    Current Outpatient Medications  Medication Sig Dispense Refill   acyclovir (ZOVIRAX) 400 MG tablet TAKE 1 TABLET BY MOUTH TWICE A DAY 60 tablet 6   ALPRAZolam (XANAX) 1 MG tablet TAKE 1 TABLET BY MOUTH 3 TIMES DAILY AS NEEDED FOR ANXIETY 90 tablet 0   CALCIUM-VITAMIN D PO Take 1 tablet by mouth 2 (two) times daily.      cetirizine (ZYRTEC) 10 MG tablet Take 1 tablet (10 mg total) by mouth daily. 30 tablet 0   dexamethasone (DECADRON) 4 MG tablet      dexamethasone (DECADRON) 4 MG tablet TAKE 1 TABLET BY MOUTH ONCE A DAY 30 tablet 0   fluticasone (FLONASE) 50 MCG/ACT nasal spray Place 2 sprays into both nostrils daily. 16 g 6   furosemide (LASIX) 20 MG tablet TAKE 1 TABLET BY MOUTH ONCE A DAY AS NEEDED 30 tablet 3   gabapentin (NEURONTIN) 400 MG capsule TAKE 1 CAPSULE BY MOUTH 3 TIMES A DAY 90 capsule 4   Homeopathic Products (THERAWORX RELIEF EX) Apply topically as needed.     ibuprofen (ADVIL) 600 MG tablet Take 1 tablet (600 mg total) by mouth every 8 (eight) hours as needed. 30 tablet 0   lidocaine-prilocaine (EMLA) cream Apply small amount over port one (1) hour prior to appointment. 30 g 0   montelukast (SINGULAIR) 10 MG tablet TAKE 1 TABLET BY MOUTH EVERY NIGHT AT BEDTIME 30 tablet 3   omeprazole (PRILOSEC) 20 MG capsule TAKE 1 CAPSULE BY MOUTH ONCE DAILY 30 capsule 5   oxybutynin (DITROPAN-XL) 10 MG 24 hr tablet TAKE 1 TABLET BY MOUTH EVERY NIGHT AT BEDTIME 90 tablet 0   Oxycodone HCl 10 MG TABS TAKE 1 TO 2 TABLETS BY MOUTH 3 TIMES DAILY AS NEEDED 90 tablet 0   predniSONE (DELTASONE) 20 MG tablet Take 2 tablets (40 mg total) by mouth daily with breakfast. 10 tablet 0    senna-docusate (SENOKOT-S) 8.6-50 MG tablet Take 1 tablet by mouth 2 (two) times daily. 60 tablet 5   silver sulfADIAZINE (SILVADENE) 1 % cream Apply 1 application topically daily. 50 g 0   tamsulosin (FLOMAX) 0.4 MG CAPS capsule TAKE ONE CAPSULE BY MOUTH EVERY NIGHT ATBEDTIME 30 capsule 3   triamcinolone cream (KENALOG) 0.1 % APPLY 1 APPLICATION TOPICALLY TWICE A DAY 30 g 0   TURMERIC PO Take by mouth.     zolendronic acid (ZOMETA) 4 MG/5ML injection Inject 4 mg into the vein every 8 (eight) weeks.     zolpidem (AMBIEN) 10 MG tablet Take 1 tablet (10 mg total) by mouth at bedtime as needed for sleep (do not take with xanax). 30 tablet 2   amoxicillin-clavulanate (AUGMENTIN)  875-125 MG tablet Take 1 tablet by mouth 2 (two) times daily. 14 tablet 0   naloxone (NARCAN) nasal spray 4 mg/0.1 mL SPRAY 1 SPRAY INTO ONE NOSTRIL AS DIRECTED FOR OPIOID OVERDOSE (TURN PERSON ON SIDE AFTER DOSE. IF NO RESPONSE IN 2-3 MINUTES OR PERSON RESPONDS BUT RELAPSES, REPEAT USING A NEW SPRAY DEVICE AND SPRAY INTO THE OTHER NOSTRIL. CALL 911 AFTER USE.) * EMERGENCY USE ONLY * (Patient not taking: Reported on 01/08/2022) 1 each 0   neomycin-polymyxin-hydrocortisone (CORTISPORIN) OTIC solution Place 3 drops into the left ear 4 (four) times daily. 10 mL 0   traZODone (DESYREL) 100 MG tablet Take 3 tablets (300 mg total) by mouth at bedtime. 90 tablet 1   Current Facility-Administered Medications  Medication Dose Route Frequency Provider Last Rate Last Admin   0.9 %  sodium chloride infusion   Intravenous Once Eulogio Bear, NP       Facility-Administered Medications Ordered in Other Visits  Medication Dose Route Frequency Provider Last Rate Last Admin   heparin lock flush 100 unit/mL  500 Units Intravenous Once Lloyd Huger, MD        OBJECTIVE: Vitals:   01/08/22 0959  BP: (!) 117/59  Pulse: 62  Resp: 18  Temp: 98.5 F (36.9 C)      Body mass index is 24.02 kg/m.    ECOG FS:2 - Symptomatic, <50%  confined to bed  General: Well-developed, well-nourished, no acute distress.  Sitting in a wheelchair. Eyes: Pink conjunctiva, anicteric sclera. HEENT: Normocephalic, moist mucous membranes. Lungs: No audible wheezing or coughing. Heart: Regular rate and rhythm. Abdomen: Soft, nontender, no obvious distention. Musculoskeletal: No edema, cyanosis, or clubbing. Neuro: Alert, answering all questions appropriately. Cranial nerves grossly intact. Skin: No rashes or petechiae noted. Psych: Normal affect.   LAB RESULTS:  Lab Results  Component Value Date   NA 131 (L) 01/08/2022   K 4.1 01/08/2022   CL 103 01/08/2022   CO2 27 01/08/2022   GLUCOSE 108 (H) 01/08/2022   BUN 29 (H) 01/08/2022   CREATININE 0.75 01/08/2022   CALCIUM 7.8 (L) 01/08/2022   PROT 8.7 (H) 12/04/2021   ALBUMIN 1.8 (L) 12/04/2021   AST 26 12/04/2021   ALT 12 12/04/2021   ALKPHOS 26 (L) 12/04/2021   BILITOT 0.2 (L) 12/04/2021   GFRNONAA >60 01/08/2022   GFRAA >60 10/13/2019    Lab Results  Component Value Date   WBC 6.1 01/08/2022   NEUTROABS 4.5 01/08/2022   HGB 10.1 (L) 01/08/2022   HCT 32.1 (L) 01/08/2022   MCV 95.8 01/08/2022   PLT 149 (L) 01/08/2022     STUDIES: No results found.  ASSESSMENT: Multiple myeloma  PLAN:  Multiple myeloma: Please see note from September 17, 2020 for historic details of his myeloma and treatment.  Since reinitiating daratumumab, patient's M spike trended down to 3.6, but recently has increased back to 4.2.  His most recent IgG level has also trended up to 6201 despite treatment.  Kappa free light chains are essentially stable at 185.7.  Given likely progression of disease, will discontinue daratumumab.  We once again discussed comfort care and hospice at length, but patient does not wish to pursue this option at this time.  We will try dose reduced Pomalyst 3 mg daily for 21 days with 7 days off.  Patient expressed understanding that this is likely his last option of  treatment.  Return to clinic in 4 weeks after initiating Pomalyst for further evaluation.  Appreciate  clinical pharmacy input.   Thrombocytopenia: Resolved. Back/joint pain: Chronic and unchanged.  MRI results reviewed independently consistent with progressive myeloma.  No pathologic fractures reported.  Patient has completed palliative XRT.  Continue current narcotics as prescribed.    Peripheral neuropathy: Chronic and unchanged.  Continue gabapentin as prescribed. Insomnia/anxiety: Chronic and unchanged.  Patient is now having his medications adjusted by primary care.   Fractured left clavicle: Patient does not complain of pain in this area.  Follow-up with orthopedics as indicated. Anemia: Hemoglobin mildly improved to 10.1.  Monitor. Hypocalcemia: Given patient's low albumin this corrects to near normal.   Ear infection: Nearly resolved.  Patient was given a refill of his Augmentin today.  Patient expressed understanding and was in agreement with this plan. He also understands that He can call clinic at any time with any questions, concerns, or complaints.    Cancer Staging  No matching staging information was found for the patient.  Lloyd Huger, MD   01/08/2022 2:55 PM

## 2022-01-08 NOTE — Progress Notes (Signed)
Nutrition:  Patient in clinic this am but RD unable to see patient while here.    Called daughter for phone follow-up.  No answer. Left message with call back number. Noted patient not doing well.  MD offered pomalyst as last treatment option.  Palliative care following.   RD available as needed.  Kyliyah Stirn B. Zenia Resides, [place], [place] Registered Dietitian [phone]

## 2022-01-08 NOTE — Telephone Encounter (Signed)
Oral Oncology Patient Advocate Encounter   Received notification that prior authorization for Pomalyst is required.   PA submitted on 12.06.23  Key [id number]  Status is pending     Dean [name], [place] Patient [place]  [phone] (phone) [phone] (fax) 01/08/2022 2:13 PM

## 2022-01-08 NOTE — Progress Notes (Signed)
Feels weak, losing weight. Low appetite. Left side pain from a fall last week. Laid on the floor all night long. Very concerned related to caregiving of his wife with dementia. He cannot hardly care for himself. SW referral placed in epic.

## 2022-01-08 NOTE — Telephone Encounter (Signed)
Oral Oncology Patient Advocate Encounter  Prior Authorization for Pomalyst has been approved.    PA# [id number]  Effective dates: 01.01.23 through 12.05.24  Patients co-pay is $4.01.    Berdine Addison, [facility] Patient [place]  [phone] (phone) [phone] (fax) 01/08/2022 2:37 PM

## 2022-01-08 NOTE — Progress Notes (Signed)
[place] at [name] [phone] [phone]  Patient Care Team: [name], MD as PCP - General (Family Medicine) Gery Pray, MD as Consulting Physician (Radiation Oncology) Danie Binder, MD (Inactive) as Consulting Physician (Gastroenterology) Michael Boston, MD as Consulting Physician (General Surgery) Lloyd Huger, MD as Consulting Physician (Oncology)   NAME OF PATIENT: Dean [name]  [id number]  Jun 28, 1935   DATE OF VISIT: 01/08/22  REASON FOR CONSULT: Dean Klus. is a 86 y.o. male with multiple medical problems including multiple myeloma with disease progression currently on treatment.  Patient has had significant pain and declining performance status.  He has had recurrent falls including previous hip fracture.  Palliative care was consulted to address goals.  SOCIAL HISTORY:     reports that he has never smoked. He has never used smokeless tobacco. He reports that he does not drink alcohol and does not use drugs.  Patient lives at home with his significant other.  She has dementia and he is her primary caregiver.  Patient has 2 daughters and a son who lives nearby.  Patient previously worked as a Programmer, systems.  ADVANCE DIRECTIVES:  Does not have  CODE STATUS: DNR/DNI (DNR order signed on 09/04/21)   PAST MEDICAL HISTORY: Past Medical History:  Diagnosis Date   BPH (benign prostatic hyperplasia)    Colonic diverticular abscess 01/08/2015   Diverticulitis 83/15/1761   complicated by abscess and required percutaneous drainage   GERD (gastroesophageal reflux disease)    H/O ETOH abuse    History of chemotherapy last done jan 2017   History of radiation therapy 01/05/13-02/10/13   45 gray to left occipital condyle region   History of radiation therapy 12/01/16-12/10/16   Parasternal nodule, chest- 24 Gy total delivered in 8 fractions, Left sacro-iliac, pelvis- 24 Gy  total delivered in 8 fractions    History of radiation therapy 02/19/17-02/22/17   right temporal scalp 30 Gy in 10 fractions   History of radiation therapy    Right hip( Pelvis) 11/18/21-11/21/21-Dr. Gery Pray   Intra-abdominal abscess ([place])    Multiple myeloma ([place]) 2015   Neuropathy    Radiation 02/21/14-03/08/14   right posterior chest wall area 30 gray   Radiation 04/19/14-05/02/14   lumbar spine 25 gray   Skull lesion    Left occipital condyle   Wrist fracture, left    x 2    PAST SURGICAL HISTORY:  Past Surgical History:  Procedure Laterality Date   COLONOSCOPY N/A 04/19/2015   Procedure: COLONOSCOPY;  Surgeon: Danie Binder, MD;  Location: [facility];  Service: Endoscopy;  Laterality: N/A;  1:30 PM   CYST EXCISION  1959   tail bone   IR IMAGING GUIDED PORT INSERTION  09/25/2017    HEMATOLOGY/ONCOLOGY HISTORY:  Oncology History  Multiple myeloma without remission ([place])  11/16/2012 Initial Diagnosis   L occipital condyle destructive lesion, not biopsied secondary to location. XRT   12/01/2012 Imaging   L3 superior endplate compression FX, no lytic lesions to suggest myeloma   02/06/2014 Imaging   soft tissue mass along the right posterior fifth rib with some rib destruction   02/16/2014 Miscellaneous   Normal CBC, Normal CMP, kappa,lamda ratio of 2.62 (abnormal), UIEP with slightly restricted band, IgG kappa, SPEP/IEP with monoclonal protein at 0.79 g/dl, normal igG, suppressed IGM   02/20/2014 Bone Marrow Biopsy   33% kappa restricted plasma cells Normal FISH, normal cytogenetics   02/21/2014 -  03/08/2014 Radiation Therapy   30Gy to R rib lesion, lesion not biopsied   03/16/2014 PET scan   Scattered hypermetabolic osseous lesions, including the calvarium, ribs, left scapula, sacrum, and left femoral shaft. An expansile left vertebral body lesion at L3 extends into the epidural space of the left lateral recess,    03/17/2014 - 04/07/2014 Chemotherapy   Velcade and  Dexamethasone due to initial denial of Revlimid by insurance company.  Zometa monthly.   03/22/2014 Imaging   MR_ L-Spine- Enhancing lesions compatible with multiple myeloma at L2, L3, and S1-2.   04/07/2014 - 07/28/2014 Chemotherapy   RVD with Revlimid at 25 mg days 1-14.  Revlimid dose reduced to 25 mg every other day x 14 days with 7 day respite beginning on day 1 of cycle 2.   04/17/2014 Adverse Reaction   Revlimid-induced rash.  Treated with steroids.   07/28/2014 - 08/04/2014 Chemotherapy   Revlimid daily   08/04/2014 Adverse Reaction   Velcade-induced peripheral neuropathy   08/04/2014 Treatment Plan Change   D/C Velcade   08/11/2014 PET scan   Response to therapy. Improvement and resolution of foci of osseous hypermetabolism.   08/22/2014 - 08/28/2014 Chemotherapy   Revlimid 25 mg days 1-21 every 28 days   08/28/2014 Adverse Reaction   Revlimid-induced rash. Medication held.  Medrol dose Pak prescribed.   09/04/2014 - 01/08/2015 Chemotherapy   Revlimid 10 mg every other day, without dexamethasone   01/08/2015 - 01/15/2015 Hospital Admission   Colonic diverticular abscess with IR drain placement   03/02/2015 PET scan   Only bony uptake is low activity at site of deformity/callus at R second rib FX, high activity in sigmoid colon, advanced sigmoid divertidulosis. Abscess not resolved?   06/04/2015 Imaging   CT nonobstructive L renal calculus, diverticulosis of descending and sigmoid colon without inflammation, moderate prostatic enlargement   07/12/2015 Surgery   Diverticulitis s/p robotic sigmoid colectomy with Dr. Johney Maine   08/23/2015 PET scan   Primarily similar hypermetabolic osseous foci within the R sided ribs. new L third rib focus of hypermetab and sclerosis is favored to be related to healing FX. no soft tissue myeloma id'd. Presumed postop hypermetab and edema about the R pelvic wall    03/06/2016 PET scan   1. Reduced activity in the prior lesion such as the right second and fifth  rib lesions, activity nearly completely resolved and significantly less than added mediastinal blood pool activity. No new lesions are identified. 2. Other imaging findings of potential clinical significance: Coronary, aortic arch, and branch vessel atherosclerotic vascular disease. Aortoiliac atherosclerotic vascular disease. Enlarged prostate gland. Colonic diverticula.   02/17/2017 PET scan   HEAD/NECK: No hypermetabolic activity in the scalp. No hypermetabolic cervical lymph nodes. CHEST: No hypermetabolic mediastinal or hilar nodes. Right upper lobe scarring/atelectasis. No suspicious pulmonary nodules on the CT scan. ABDOMEN/PELVIS: No abnormal hypermetabolic activity within the liver, pancreas, adrenal glands, or spleen. No hypermetabolic lymph nodes in the abdomen or pelvis. Atherosclerotic calcifications of the abdominal aorta and branch vessels. Left renal sinus cysts. Sigmoid diverticulosis, without evidence of diverticulitis. Prostatomegaly. SKELETON: Vague hypermetabolism involving the inferior sternum, max SUV 3.2, previously 8.2. Focal hypermetabolism involving the left sacrum, max SUV 4.0, previously 6.7. EXTREMITIES: No abnormal hypermetabolic activity in the lower extremities.   02/18/2017 -  Chemotherapy   Bortezomib 1.49m/m2 QWk + Dexamethasone 183mQTue/Fri --Cycle #1, 02/18/17    02/18/2017 - 08/26/2017 Chemotherapy   The patient had bortezomib SQ (VELCADE) chemo injection 3 mg, 1.3 mg/m2 =  3 mg, Subcutaneous,  Once, 7 of 9 cycles Administration: 3 mg (02/18/2017), 3 mg (02/26/2017), 3 mg (03/04/2017), 3 mg (03/11/2017), 3 mg (04/08/2017), 3 mg (03/18/2017), 3 mg (03/25/2017), 3 mg (04/01/2017), 3 mg (05/06/2017), 3 mg (05/20/2017), 3 mg (06/03/2017), 3 mg (06/17/2017), 3 mg (07/01/2017), 3 mg (07/15/2017), 3 mg (07/29/2017), 3 mg (08/12/2017), 3 mg (08/26/2017)  for chemotherapy treatment.    09/17/2017 - 10/02/2021 Chemotherapy   Patient is on Treatment Plan : MYELOMA Daratumumab q28d      09/17/2017 -  Chemotherapy   Patient is on Treatment Plan : MYELOMA Daratumumab SQ q28d       ALLERGIES:  is allergic to codeine, morphine and related, and revlimid [lenalidomide].  MEDICATIONS:  Current Outpatient Medications  Medication Sig Dispense Refill   acyclovir (ZOVIRAX) 400 MG tablet TAKE 1 TABLET BY MOUTH TWICE A DAY 60 tablet 6   ALPRAZolam (XANAX) 1 MG tablet TAKE 1 TABLET BY MOUTH 3 TIMES DAILY AS NEEDED FOR ANXIETY 90 tablet 0   amoxicillin-clavulanate (AUGMENTIN) 875-125 MG tablet Take 1 tablet by mouth 2 (two) times daily. 14 tablet 0   CALCIUM-VITAMIN D PO Take 1 tablet by mouth 2 (two) times daily.      cetirizine (ZYRTEC) 10 MG tablet Take 1 tablet (10 mg total) by mouth daily. 30 tablet 0   dexamethasone (DECADRON) 4 MG tablet      dexamethasone (DECADRON) 4 MG tablet TAKE 1 TABLET BY MOUTH ONCE A DAY 30 tablet 0   fluticasone (FLONASE) 50 MCG/ACT nasal spray Place 2 sprays into both nostrils daily. 16 g 6   furosemide (LASIX) 20 MG tablet TAKE 1 TABLET BY MOUTH ONCE A DAY AS NEEDED 30 tablet 3   gabapentin (NEURONTIN) 400 MG capsule TAKE 1 CAPSULE BY MOUTH 3 TIMES A DAY 90 capsule 4   Homeopathic Products (THERAWORX RELIEF EX) Apply topically as needed.     ibuprofen (ADVIL) 600 MG tablet Take 1 tablet (600 mg total) by mouth every 8 (eight) hours as needed. 30 tablet 0   lidocaine-prilocaine (EMLA) cream Apply small amount over port one (1) hour prior to appointment. 30 g 0   montelukast (SINGULAIR) 10 MG tablet TAKE 1 TABLET BY MOUTH EVERY NIGHT AT BEDTIME 30 tablet 3   naloxone (NARCAN) nasal spray 4 mg/0.1 mL SPRAY 1 SPRAY INTO ONE NOSTRIL AS DIRECTED FOR OPIOID OVERDOSE (TURN PERSON ON SIDE AFTER DOSE. IF NO RESPONSE IN 2-3 MINUTES OR PERSON RESPONDS BUT RELAPSES, REPEAT USING A NEW SPRAY DEVICE AND SPRAY INTO THE OTHER NOSTRIL. CALL 911 AFTER USE.) * EMERGENCY USE ONLY * (Patient not taking: Reported on 01/08/2022) 1 each 0   neomycin-polymyxin-hydrocortisone  (CORTISPORIN) OTIC solution Place 3 drops into the left ear 4 (four) times daily. 10 mL 0   omeprazole (PRILOSEC) 20 MG capsule TAKE 1 CAPSULE BY MOUTH ONCE DAILY 30 capsule 5   oxybutynin (DITROPAN-XL) 10 MG 24 hr tablet TAKE 1 TABLET BY MOUTH EVERY NIGHT AT BEDTIME 90 tablet 0   Oxycodone HCl 10 MG TABS TAKE 1 TO 2 TABLETS BY MOUTH 3 TIMES DAILY AS NEEDED 90 tablet 0   predniSONE (DELTASONE) 20 MG tablet Take 2 tablets (40 mg total) by mouth daily with breakfast. 10 tablet 0   senna-docusate (SENOKOT-S) 8.6-50 MG tablet Take 1 tablet by mouth 2 (two) times daily. 60 tablet 5   silver sulfADIAZINE (SILVADENE) 1 % cream Apply 1 application topically daily. 50 g 0   tamsulosin (FLOMAX) 0.4 MG CAPS capsule  TAKE ONE CAPSULE BY MOUTH EVERY NIGHT ATBEDTIME 30 capsule 3   traZODone (DESYREL) 100 MG tablet Take 3 tablets (300 mg total) by mouth at bedtime. 90 tablet 1   triamcinolone cream (KENALOG) 0.1 % APPLY 1 APPLICATION TOPICALLY TWICE A DAY 30 g 0   TURMERIC PO Take by mouth.     zolendronic acid (ZOMETA) 4 MG/5ML injection Inject 4 mg into the vein every 8 (eight) weeks.     zolpidem (AMBIEN) 10 MG tablet Take 1 tablet (10 mg total) by mouth at bedtime as needed for sleep (do not take with xanax). 30 tablet 2   Current Facility-Administered Medications  Medication Dose Route Frequency Provider Last Rate Last Admin   0.9 %  sodium chloride infusion   Intravenous Once Eulogio Bear, NP       Facility-Administered Medications Ordered in Other Visits  Medication Dose Route Frequency Provider Last Rate Last Admin   heparin lock flush 100 unit/mL  500 Units Intravenous Once Lloyd Huger, MD        VITAL SIGNS: There were no vitals taken for this visit. There were no vitals filed for this visit.  Estimated body mass index is 24.02 kg/m as calculated from the following:   Height as of 01/01/22: _0  (1.727 m).   Weight as of an earlier encounter on 01/08/22: 158 lb (71.7  kg).  LABS: CBC:    Component Value Date/Time   WBC 6.1 01/08/2022 0932   HGB 10.1 (L) 01/08/2022 0932   HCT 32.1 (L) 01/08/2022 0932   PLT 149 (L) 01/08/2022 0932   MCV 95.8 01/08/2022 0932   NEUTROABS 4.5 01/08/2022 0932   LYMPHSABS 1.1 01/08/2022 0932   MONOABS 0.4 01/08/2022 0932   EOSABS 0.0 01/08/2022 0932   BASOSABS 0.0 01/08/2022 0932   Comprehensive Metabolic Panel:    Component Value Date/Time   NA 131 (L) 01/08/2022 0932   K 4.1 01/08/2022 0932   CL 103 01/08/2022 0932   CO2 27 01/08/2022 0932   BUN 29 (H) 01/08/2022 0932   CREATININE 0.75 01/08/2022 0932   CREATININE 0.81 03/26/2021 1117   GLUCOSE 108 (H) 01/08/2022 0932   CALCIUM 7.8 (L) 01/08/2022 0932   AST 26 12/04/2021 0938   ALT 12 12/04/2021 0938   ALKPHOS 26 (L) 12/04/2021 0938   BILITOT 0.2 (L) 12/04/2021 0938   PROT 8.7 (H) 12/04/2021 0938   ALBUMIN 1.8 (L) 12/04/2021 8588    RADIOGRAPHIC STUDIES: No results found.  PERFORMANCE STATUS (ECOG) : 3 - Symptomatic, >50% confined to bed  Review of Systems Unless otherwise noted, a complete review of systems is negative.  Physical Exam General: NAD Pulmonary: Unlabored Extremities: no edema, no joint deformities, left arm in sling Skin: no rashes Neurological: Weakness but otherwise nonfocal  IMPRESSION: Patient is doing poorly.  His performance status has further decline.  Patient had a recent fall resulting in him laying on the floor overnight.  Per daughter, patient is demented significant other is now providing primary caregiving to the patient.  Family agrees that living situation is not going well.  Patient is interested in speaking with social work today to explore caregiving resources.  Given overall frailty, patient is a poor candidate for further cancer treatment.  [name] did speak with patient today and agreed to try Pomalyst as patient's goals are still aligned with aggressive treatment.  However, it was reiterated to the patient  and family that this is the last treatment option available and  that further decline would likely result in a transition to comfort care/hospice.  Patient confirmed DNR/DNI.  PLAN: -Continue current scope of treatment -DNR/DNI -Referral to social work -Follow-up telephone visit 1 to 2 months  Case and plan discussed with [name]   Patient expressed understanding and was in agreement with this plan. He also understands that He can call clinic at any time with any questions, concerns, or complaints.   Thank you for allowing me to participate in the care of this very pleasant patient.   Time Total: 20 minutes  Visit consisted of counseling and education dealing with the complex and emotionally intense issues of symptom management in the setting of serious illness.Greater than 50%  of this time was spent counseling and coordinating care related to the above assessment and plan.  Signed by: Altha Harm, PhD, NP-C

## 2022-01-08 NOTE — Telephone Encounter (Unsigned)
Oral Oncology Pharmacist Encounter  Received new prescription for Pomalyst (pomalidomide) for the treatment of progressive multiple myeloma in conjunction with dexamethasone, planned duration until disease progression or unacceptable drug toxicity.  CMP from 12/04/21 assessed, no relevant lab abnormalities. Prescription dose and frequency assessed.   Current medication list in Epic reviewed, no relevant DDIs with pomalidomide identified.   Evaluated chart and no patient barriers to medication adherence identified.   Prescription has been e-scribed to the [facility] for benefits analysis and approval.  Oral Oncology Clinic will continue to follow for insurance authorization, copayment issues, initial counseling and start date.  Patient agreed to treatment on 01/08/22 per MD documentation.  Darl Pikes, PharmD, BCPS, BCOP, CPP Hematology/Oncology Clinical Pharmacist Practitioner /DB/[facility] [phone]  01/08/2022 1:44 PM

## 2022-01-08 NOTE — Progress Notes (Signed)
Radiation Oncology         [phone]) [phone] ________________________________  Name: Dean [name]. MRN: [id number]  Date: 01/09/2022  DOB: Oct 21, 1935  Follow-Up Visit Note  CC: Dean Frizzle, MD  Dean Frizzle, MD  No diagnosis found.  Diagnosis: The encounter diagnosis was Multiple myeloma without remission (Dean [name]).   Multiple myeloma without remission with multiple sites of osseous metastases   Interval Since Last Radiation: 1 month and 18 days  8)  Intent: Palliative Radiation Treatment Dates: 11/18/2021 through 11/21/2021 Site Technique Total Dose (Gy) Dose per Fx (Gy) Completed Fx Beam Energies  Hip, Right: Pelvis_R Complex 20/20 5 4/4 15X   7) 8 Gy to the left hip in August 2023 with Dr. Baruch [name]   6) Diagnosis:   Recurrent Multiple Myeloma    Indication for treatment:  Palliative      Radiation treatment dates:   09/29/17 - 10/14/17 Site/dose:   Ribs, Left/ 30 Gy delivered in 10 fractions of 3 Gy Beams/energy:   Isodose Plan/ 10X   5) Diagnosis: Recurrent multiple myeloma    Indication for treatment: Palliative, pain control     Radiation treatment dates:  02/19/17-02/22/17 Site/dose:  Right temporal Scalp/ 30 Gy in 10 fractions Beams/energy: 3D/ 6X   4) Dean [name] multiple myeloma without remission.  Indication for treatment:  Palliative       Radiation treatment dates:   12/01/16-12/10/16  Site/dose:  1. Parasternal nodule, chest- 24 Gy total delivered in 8 fractions  2. Left sacro-iliac, pelvis- 24 Gy total delivered in 8 fractions  Beams/energy:    1. Isodose Plan Photon, 6X/10X 2. Isodose Plan Photon, 15X   3) Diagnosis:  Multiple myeloma involving the lumbar spine     Indication for treatment:  Significant involvement in the lumbar spine and associated pain      Radiation treatment dates:   March 16 through May 02 2014 Site/dose:   Lumbar spine and 25 gray in 10 fractions Beams/energy:   AP PA, 3-D conformal, 10 and 15 x beams    2) Diagnosis: Multiple myeloma  Indication for treatment:  Painful soft tissue/chest wall mass      Radiation treatment dates:   January 19 through March 08 2014 Site/dose:  Right posterior chest wall area, 30 gray in 10 fractions Beams/energy:   3-D conformal, 15x, 6x   1) Diagnosis:  Left occipital condyle infiltrative lesion, occipital condyle syndrome Indication for treatment:  Pain and neurologic compromise Radiation treatment dates:   December 3 through February 10 2013 Site/dose:   Left occipital condyle region, 45 Gy in 25 fractions Beams/energy:   3 field conformal setup, 10 and 15 MV photons   Narrative:  The patient returns today for routine follow-up. The patient tolerated radiation therapy relatively well. During his final weekly treatment check on 11/19/21, the patient reported fatigue. He denied any other concerns, and endorsed improvement in his pain along the right pelvis area particularly with standing and moving around.                Since completing radiation, the patient followed up with Dr. Grayland [name] on 12/04/21. During this visit, the patient reported having several more falls that past week, increased right flank/hip pain, ongoing bilateral hip pain, poor appetite, and chronic weakness and fatigue. He denied any other concerns and was cleared to proceed with his daratumumab and Zometa infusions that day. (X-ray of the right hip performed that day demonstrated the multiple known small, oval, cortical lucencies  in the proximal right femur compatible with multiple myeloma, as well as severe right hip degenerative changes. Imaging otherwise showed no evidence of fracture or dislocation).    During a follow-up visit with Dr. Grayland [name] on 01/01/22, the patient endorsed no change in his symptoms (noted above). He did report a recently diagnosed ear infection which did not improve with abx. He also reported that his symptoms became worse, detailed by new jaw and neck pain. Given  his symptomatic ear infection, his treatment was to be held for 1 week, and he was given a prescription for Augmentin.   During his most recent follow up visit with Dr. Grayland [name] yesterday, the patient's performance status was noted to have declined further. The patient's daughter (who was present at the time of this visit), stated that he had a recent fall which resulted in him laying on the floor overnight. She also reported progression of his dementia, prompting his significant other to become his primary care giver. The patient is aware of his decline and requested speaking with a Education officer, museum to explore care giving options.   Given his poor performance status/frailty, Dr. Grayland [name] thinks that the patient is a poor candidate for further chemotherapy. However, the patient is still addiment in pursuing aggressive treatment and has opted to try Pomalyst. Dr. Grayland [name] has reiterated to the patient that this is the last treatment option available for him before considering hospice/comfort care.   ***                Allergies:  is allergic to codeine, morphine and related, and revlimid [lenalidomide].  Meds: Current Outpatient Medications  Medication Sig Dispense Refill   acyclovir (ZOVIRAX) 400 MG tablet TAKE 1 TABLET BY MOUTH TWICE A DAY 60 tablet 6   ALPRAZolam (XANAX) 1 MG tablet TAKE 1 TABLET BY MOUTH 3 TIMES DAILY AS NEEDED FOR ANXIETY 90 tablet 0   amoxicillin-clavulanate (AUGMENTIN) 875-125 MG tablet Take 1 tablet by mouth 2 (two) times daily. 14 tablet 0   CALCIUM-VITAMIN D PO Take 1 tablet by mouth 2 (two) times daily.      cetirizine (ZYRTEC) 10 MG tablet Take 1 tablet (10 mg total) by mouth daily. 30 tablet 0   dexamethasone (DECADRON) 4 MG tablet      dexamethasone (DECADRON) 4 MG tablet TAKE 1 TABLET BY MOUTH ONCE A DAY 30 tablet 0   fluticasone (FLONASE) 50 MCG/ACT nasal spray Place 2 sprays into both nostrils daily. 16 g 6   furosemide (LASIX) 20 MG tablet TAKE 1 TABLET BY MOUTH  ONCE A DAY AS NEEDED 30 tablet 3   gabapentin (NEURONTIN) 400 MG capsule TAKE 1 CAPSULE BY MOUTH 3 TIMES A DAY 90 capsule 4   Homeopathic Products (THERAWORX RELIEF EX) Apply topically as needed.     ibuprofen (ADVIL) 600 MG tablet Take 1 tablet (600 mg total) by mouth every 8 (eight) hours as needed. 30 tablet 0   lidocaine-prilocaine (EMLA) cream Apply small amount over port one (1) hour prior to appointment. 30 g 0   montelukast (SINGULAIR) 10 MG tablet TAKE 1 TABLET BY MOUTH EVERY NIGHT AT BEDTIME 30 tablet 3   naloxone (NARCAN) nasal spray 4 mg/0.1 mL SPRAY 1 SPRAY INTO ONE NOSTRIL AS DIRECTED FOR OPIOID OVERDOSE (TURN PERSON ON SIDE AFTER DOSE. IF NO RESPONSE IN 2-3 MINUTES OR PERSON RESPONDS BUT RELAPSES, REPEAT USING A NEW SPRAY DEVICE AND SPRAY INTO THE OTHER NOSTRIL. CALL 911 AFTER USE.) * EMERGENCY USE ONLY * (Patient not  taking: Reported on 01/08/2022) 1 each 0   neomycin-polymyxin-hydrocortisone (CORTISPORIN) OTIC solution Place 3 drops into the left ear 4 (four) times daily. 10 mL 0   omeprazole (PRILOSEC) 20 MG capsule TAKE 1 CAPSULE BY MOUTH ONCE DAILY 30 capsule 5   oxybutynin (DITROPAN-XL) 10 MG 24 hr tablet TAKE 1 TABLET BY MOUTH EVERY NIGHT AT BEDTIME 90 tablet 0   Oxycodone HCl 10 MG TABS TAKE 1 TO 2 TABLETS BY MOUTH 3 TIMES DAILY AS NEEDED 90 tablet 0   predniSONE (DELTASONE) 20 MG tablet Take 2 tablets (40 mg total) by mouth daily with breakfast. 10 tablet 0   senna-docusate (SENOKOT-S) 8.6-50 MG tablet Take 1 tablet by mouth 2 (two) times daily. 60 tablet 5   silver sulfADIAZINE (SILVADENE) 1 % cream Apply 1 application topically daily. 50 g 0   tamsulosin (FLOMAX) 0.4 MG CAPS capsule TAKE ONE CAPSULE BY MOUTH EVERY NIGHT ATBEDTIME 30 capsule 3   traZODone (DESYREL) 100 MG tablet Take 3 tablets (300 mg total) by mouth at bedtime. 90 tablet 1   triamcinolone cream (KENALOG) 0.1 % APPLY 1 APPLICATION TOPICALLY TWICE A DAY 30 g 0   TURMERIC PO Take by mouth.     zolendronic acid  (ZOMETA) 4 MG/5ML injection Inject 4 mg into the vein every 8 (eight) weeks.     zolpidem (AMBIEN) 10 MG tablet Take 1 tablet (10 mg total) by mouth at bedtime as needed for sleep (do not take with xanax). 30 tablet 2   Current Facility-Administered Medications  Medication Dose Route Frequency Provider Last Rate Last Admin   0.9 %  sodium chloride infusion   Intravenous Once Eulogio Bear, NP       Facility-Administered Medications Ordered in Other Encounters  Medication Dose Route Frequency Provider Last Rate Last Admin   heparin lock flush 100 unit/mL  500 Units Intravenous Once Lloyd Huger, MD        Physical Findings: The patient is in no acute distress. Patient is alert and oriented.  vitals were not taken for this visit. .  No significant changes. Lungs are clear to auscultation bilaterally. Heart has regular rate and rhythm. No palpable cervical, supraclavicular, or axillary adenopathy. Abdomen soft, non-tender, normal bowel sounds.   Lab Findings: Lab Results  Component Value Date   WBC 6.1 01/08/2022   HGB 10.1 (L) 01/08/2022   HCT 32.1 (L) 01/08/2022   MCV 95.8 01/08/2022   PLT 149 (L) 01/08/2022    Radiographic Findings: No results found.  Impression:  The encounter diagnosis was Multiple myeloma without remission ([place]).   Multiple myeloma without remission with multiple sites of osseous metastases   The patient is recovering from the effects of radiation.  ***  Plan:  ***   *** minutes of total time was spent for this patient encounter, including preparation, face-to-face counseling with the patient and coordination of care, physical exam, and documentation of the encounter. ____________________________________  Blair Promise, PhD, MD  This document serves as a record of services personally performed by Gery Pray, MD. It was created on his behalf by Roney Mans, a trained medical scribe. The creation of this record is based on the scribe's  personal observations and the provider's statements to them. This document has been checked and approved by the attending provider.

## 2022-01-09 ENCOUNTER — Ambulatory Visit: Payer: Self-pay | Admitting: Radiation Oncology

## 2022-01-09 ENCOUNTER — Ambulatory Visit
Admission: RE | Admit: 2022-01-09 | Discharge: 2022-01-09 | Disposition: A | Discharge status: Home / Self Care | Payer: Medicare Other | Source: Ambulatory Visit | Attending: Radiation Oncology | Admitting: Radiation Oncology

## 2022-01-09 ENCOUNTER — Encounter: Payer: Self-pay | Admitting: Radiation Oncology

## 2022-01-09 ENCOUNTER — Other Ambulatory Visit (HOSPITAL_COMMUNITY): Payer: Self-pay

## 2022-01-09 ENCOUNTER — Inpatient Hospital Stay: Payer: Medicare Other | Admitting: Licensed Clinical Social Worker

## 2022-01-09 DIAGNOSIS — C9000 Multiple myeloma not having achieved remission: Secondary | ICD-10-CM | POA: Insufficient documentation

## 2022-01-09 DIAGNOSIS — Z7961 Long term (current) use of immunomodulator: Secondary | ICD-10-CM | POA: Insufficient documentation

## 2022-01-09 DIAGNOSIS — Z923 Personal history of irradiation: Secondary | ICD-10-CM | POA: Diagnosis not present

## 2022-01-09 DIAGNOSIS — Z79899 Other long term (current) drug therapy: Secondary | ICD-10-CM | POA: Insufficient documentation

## 2022-01-09 DIAGNOSIS — M545 Low back pain, unspecified: Secondary | ICD-10-CM | POA: Diagnosis not present

## 2022-01-09 LAB — KAPPA/LAMBDA LIGHT CHAINS
Kappa free light chain: 158 mg/L — ABNORMAL HIGH (ref 3.3–19.4)
Lambda free light chains: <1.5 mg/L — ABNORMAL LOW (ref 5.7–26.3)

## 2022-01-09 MED ORDER — POMALIDOMIDE 3 MG PO CAPS: 3.0000 mg | ORAL_CAPSULE | Freq: Every day | ORAL | 0 refills | Status: DC

## 2022-01-09 NOTE — Progress Notes (Signed)
Dean [name]  Initial Assessment   Dean [name]. is a 86 y.o. year old male contacted by phone. Clinical Social [name] was referred by medical provider for assessment of psychosocial needs.  CSW spoke with patient and his daughter and primary caregiver Dean [name] [phone]   SDOH (Social Determinants of Health) assessments performed: Yes SDOH Interventions    Flowsheet Row Clinical Support from 01/09/2022 in [facility] from 04/25/2021 in [facility] Interventions    Food Insecurity Interventions Intervention Not Indicated Intervention Not Indicated  Housing Interventions Intervention Not Indicated Intervention Not Indicated  Transportation Interventions -- Intervention Not Indicated  Utilities Interventions Intervention Not Indicated --  Alcohol Usage Interventions Intervention Not Indicated (Score <7) --  Depression Interventions/Treatment  Counseling --  Financial Strain Interventions Intervention Not Indicated Intervention Not Indicated  Physical Activity Interventions Intervention Not Indicated Other (Comments)  [Encourged pt to increase as tolerated.]  Stress Interventions Provide Counseling Intervention Not Indicated  Social Connections Interventions Intervention Not Indicated Intervention Not Indicated       SDOH Screenings   Food Insecurity: No Food Insecurity (01/09/2022)  Housing: Low Risk  (01/09/2022)  Transportation Needs: No Transportation Needs (04/25/2021)  Utilities: Not At Risk (01/09/2022)  Alcohol Screen: Low Risk  (01/09/2022)  Depression (PHQ2-9): Medium Risk (01/09/2022)  Financial Resource Strain: Low Risk  (01/09/2022)  Physical Activity: Inactive (01/09/2022)  Social Connections: Moderately Isolated (01/09/2022)  Stress: Stress Concern Present (01/09/2022)  Tobacco Use: Low Risk  (01/09/2022)     Distress Screen completed: No    09/23/2017   11:10 AM  ONCBCN  DISTRESS SCREENING  Screening Type Initial Screening  Distress experienced in past week (1-10) 5  Emotional problem type Nervousness/Anxiety;Adjusting to illness;Boredom  Information Concerns Type Lack of info about treatment  Physical Problem type Pain;Sleep/insomnia;Getting around;Constipation/diarrhea;Tingling hands/feet;Swollen arms/legs  Other [phone] or [phone]      Family/Social Information:  Housing Arrangement: patient lives with spouse.  Patient's spouse has dementia dx.   Patient's daughter Dean [name] [phone] is primary caregiver and contact. Family members/support persons in your life? Family and Medical Staff Transportation concerns : no  Employment: Retired  .  Income source: Conservation officer, historic buildings and Supported by [employer] Financial concerns: No Type of concern: None Food access concerns: no Religious or spiritual practice: Not known Services Currently in place:  Medicare A&B, Medicaid  Coping/ Adjustment to diagnosis: Patient understands treatment plan and what happens next? yes Concerns about diagnosis and/or treatment: How I will care for other members of my family Patient reported stressors: Childcare/ elder care, Depression, Anxiety/ nervousness, and Physical issues Hopes and/or priorities: to get assistance for his spouse Patient enjoys  N/A Current coping skills/ strengths: Average or above average intelligence , Communication skills , Financial means , General fund of knowledge , Motivation for treatment/growth , and Supportive family/friends     SUMMARY: Current SDOH Barriers:  Financial constraints related to fixed income, Limited social support, Level of care concerns, and ADL IADL limitations  Clinical Social [name] Clinical Goal(s):  Patient will follow up with Medicaid to inquire about CAP program and personal care assistance program, Medicare to inquire about personal assistance program* as directed by  SW  Interventions: Discussed common feeling and emotions when being diagnosed with cancer, and the importance of support during treatment Informed patient of the support team roles and support services at [facility] Provided CSW contact information and encouraged patient to call with  any questions or concerns Referred patient to [place] and Provided patient with information about CSW role in patient care and other available resources. CSW spoke with patient's daughter and she stated she would contact Medicaid and Medicare.  Patient's main concern is the care of his spouse who has dementia diagnosis.    Follow Up Plan: Patient will contact CSW with any support or resource needs Patient verbalizes understanding of plan: Yes    [name], LCSW

## 2022-01-09 NOTE — Telephone Encounter (Signed)
Oral Chemotherapy Pharmacist Encounter  [name] knows to call the office once [name] starts his Pomalyst so that his follow-up appt with the office will be set. Provided [name] with the phone number to [employer] to call early next week if she has not heard anything about medication delivery set-up from them.  Patient Education Met with patient's POA [name] for overview of new oral chemotherapy medication: Pomalyst (pomalidomide) for the treatment of progressive multiple myeloma in conjunction with dexamethasone, planned duration until disease progression or unacceptable drug toxicity.  [name] on administration, dosing, side effects, monitoring, drug-food interactions, safe handling, storage, and disposal. Patient will take 1 capsule (3 mg total) by mouth daily. Take for 21 days, then hold for 7 days. Repeat every 28 days. .  Side effects include but not limited to: decreased wbc/hgb/plt, fatigue, constipation or diarrhea, nausea.    Reviewed with [name] importance of keeping a medication schedule and plan for any missed doses.  After discussion with [name] no patient barriers to medication adherence identified.   [name] voiced understanding and appreciation. All questions answered. Medication handout provided.  Provided [facility] with [facility] phone number. [name] knows to call the office with questions or concerns. Oral Chemotherapy [facility] will continue to follow.  Darl Pikes, PharmD, BCPS, BCOP, CPP Hematology/Oncology Clinical Pharmacist Practitioner [place]/DB/AP Oral [facility] [phone]  01/09/2022 1:59 PM

## 2022-01-09 NOTE — Progress Notes (Signed)
Dean [name]. is here today for follow up post radiation to the right hip.   They completed their radiation on: 11/21/21.  Does the patient complain of any of the following:  Pain: Yes, left side rating 9/10 Diarrhea/Constipation: Yes, constipation.  Nausea/Vomiting: No Urinary Issues (dysuria/incomplete emptying/ incontinence/ increased frequency/urgency):  Yes, urgency/frequency Post radiation skin changes: No   Additional comments if applicable: Patient has had multiple falls within the last few months. Patient reports having severe pain to left side near rib cage. Patient also reports hitting his head on the bed post.    Daughter requesting order for xray due to fall.    BP 113/62 (BP Location: Right Arm, Patient Position: Sitting, Cuff Size: Normal)   Pulse (!) 59   Temp 97.9 F (36.6 C)   Resp 20   Ht '5\' 8"'$  (1.727 m)   Wt 159 lb 12.8 oz (72.5 kg)   SpO2 100%   BMI 24.30 kg/m

## 2022-01-10 LAB — PROTEIN ELECTROPHORESIS, SERUM
A/G Ratio: 0.4 — ABNORMAL LOW (ref 0.7–1.7)
Albumin ELP: 2.9 g/dL (ref 2.9–4.4)
Alpha-1-Globulin: 0.3 g/dL (ref 0.0–0.4)
Alpha-2-Globulin: 0.8 g/dL (ref 0.4–1.0)
Beta Globulin: 0.9 g/dL (ref 0.7–1.3)
Gamma Globulin: 5.4 g/dL — ABNORMAL HIGH (ref 0.4–1.8)
Globulin, Total: 7.4 g/dL — ABNORMAL HIGH (ref 2.2–3.9)
M-Spike, %: 5.3 g/dL — ABNORMAL HIGH
Total Protein ELP: 10.3 g/dL — ABNORMAL HIGH (ref 6.0–8.5)

## 2022-01-11 LAB — IGG, IGA, IGM
IgA: 5 mg/dL — ABNORMAL LOW (ref 61–437)
IgG (Immunoglobin G), Serum: 6950 mg/dL — ABNORMAL HIGH (ref 603–1613)
IgM (Immunoglobulin M), Srm: 9 mg/dL — ABNORMAL LOW (ref 15–143)

## 2022-01-14 ENCOUNTER — Other Ambulatory Visit: Payer: Medicare Other

## 2022-01-14 DIAGNOSIS — Z515 Encounter for palliative care: Secondary | ICD-10-CM

## 2022-01-14 NOTE — Progress Notes (Signed)
Multiple myeloma: Starting new chemo pill. For 21 days, pause for 7 days and start back for another 21 days, per daughter this chemo pill is the only option left. Once treatment is received in the mail and patient has taken  first does patient will f/u with oncologist 4 weeks afterwards.  Appetite: Poor. Per daughter patient does not eat large portioned meals and as a result feels weak.   Mobility: daughter reports 2 falls in the past month, one of which patient has tenderness to left side (ribs). Patient uses RW.  Palliative care will continue to follow patient at this time and will follow up in 2 weeks via telephone.  

## 2022-01-29 ENCOUNTER — Ambulatory Visit: Payer: Medicare Other | Admitting: Oncology

## 2022-01-29 ENCOUNTER — Other Ambulatory Visit: Payer: Medicare Other

## 2022-01-29 ENCOUNTER — Ambulatory Visit: Payer: Medicare Other

## 2022-02-05 ENCOUNTER — Ambulatory Visit: Payer: Medicare Other | Admitting: Oncology

## 2022-02-05 ENCOUNTER — Ambulatory Visit: Payer: Medicare Other

## 2022-02-05 ENCOUNTER — Other Ambulatory Visit: Payer: Self-pay

## 2022-02-05 ENCOUNTER — Other Ambulatory Visit: Payer: Medicare Other

## 2022-02-05 DIAGNOSIS — C9000 Multiple myeloma not having achieved remission: Secondary | ICD-10-CM

## 2022-02-05 MED ORDER — POMALIDOMIDE 3 MG PO CAPS: 3.0000 mg | ORAL_CAPSULE | Freq: Every day | ORAL | 0 refills | Status: DC

## 2022-02-10 ENCOUNTER — Other Ambulatory Visit: Payer: Self-pay

## 2022-02-10 ENCOUNTER — Other Ambulatory Visit: Payer: Self-pay | Admitting: Oncology

## 2022-02-10 ENCOUNTER — Encounter (HOSPITAL_COMMUNITY): Payer: Self-pay | Admitting: Radiology

## 2022-02-10 ENCOUNTER — Emergency Department (HOSPITAL_COMMUNITY): Payer: Medicare Other

## 2022-02-10 ENCOUNTER — Ambulatory Visit: Payer: Medicare Other | Admitting: Oncology

## 2022-02-10 ENCOUNTER — Inpatient Hospital Stay (HOSPITAL_COMMUNITY)
Admission: EM | Admit: 2022-02-10 | Discharge: 2022-02-13 | DRG: 175 | Disposition: A | Discharge status: Home Health | Payer: Medicare Other | Attending: Internal Medicine | Admitting: Internal Medicine

## 2022-02-10 DIAGNOSIS — C9000 Multiple myeloma not having achieved remission: Secondary | ICD-10-CM | POA: Diagnosis present

## 2022-02-10 DIAGNOSIS — Z923 Personal history of irradiation: Secondary | ICD-10-CM

## 2022-02-10 DIAGNOSIS — N400 Enlarged prostate without lower urinary tract symptoms: Secondary | ICD-10-CM | POA: Diagnosis present

## 2022-02-10 DIAGNOSIS — I2609 Other pulmonary embolism with acute cor pulmonale: Secondary | ICD-10-CM | POA: Diagnosis not present

## 2022-02-10 DIAGNOSIS — G9341 Metabolic encephalopathy: Secondary | ICD-10-CM | POA: Diagnosis not present

## 2022-02-10 DIAGNOSIS — Z888 Allergy status to other drugs, medicaments and biological substances status: Secondary | ICD-10-CM | POA: Diagnosis not present

## 2022-02-10 DIAGNOSIS — K219 Gastro-esophageal reflux disease without esophagitis: Secondary | ICD-10-CM | POA: Diagnosis not present

## 2022-02-10 DIAGNOSIS — I471 Supraventricular tachycardia, unspecified: Secondary | ICD-10-CM | POA: Diagnosis present

## 2022-02-10 DIAGNOSIS — G8929 Other chronic pain: Secondary | ICD-10-CM | POA: Diagnosis present

## 2022-02-10 DIAGNOSIS — R54 Age-related physical debility: Secondary | ICD-10-CM | POA: Diagnosis not present

## 2022-02-10 DIAGNOSIS — I959 Hypotension, unspecified: Secondary | ICD-10-CM | POA: Diagnosis not present

## 2022-02-10 DIAGNOSIS — Z823 Family history of stroke: Secondary | ICD-10-CM | POA: Diagnosis not present

## 2022-02-10 DIAGNOSIS — J9811 Atelectasis: Secondary | ICD-10-CM | POA: Diagnosis not present

## 2022-02-10 DIAGNOSIS — G629 Polyneuropathy, unspecified: Secondary | ICD-10-CM | POA: Diagnosis present

## 2022-02-10 DIAGNOSIS — F1120 Opioid dependence, uncomplicated: Secondary | ICD-10-CM | POA: Diagnosis present

## 2022-02-10 DIAGNOSIS — R404 Transient alteration of awareness: Secondary | ICD-10-CM | POA: Diagnosis not present

## 2022-02-10 DIAGNOSIS — R296 Repeated falls: Secondary | ICD-10-CM | POA: Diagnosis present

## 2022-02-10 DIAGNOSIS — Z515 Encounter for palliative care: Secondary | ICD-10-CM | POA: Diagnosis not present

## 2022-02-10 DIAGNOSIS — Z885 Allergy status to narcotic agent status: Secondary | ICD-10-CM

## 2022-02-10 DIAGNOSIS — Z8249 Family history of ischemic heart disease and other diseases of the circulatory system: Secondary | ICD-10-CM | POA: Diagnosis not present

## 2022-02-10 DIAGNOSIS — I2694 Multiple subsegmental pulmonary emboli without acute cor pulmonale: Secondary | ICD-10-CM | POA: Diagnosis not present

## 2022-02-10 DIAGNOSIS — Z9221 Personal history of antineoplastic chemotherapy: Secondary | ICD-10-CM

## 2022-02-10 DIAGNOSIS — L89152 Pressure ulcer of sacral region, stage 2: Secondary | ICD-10-CM | POA: Diagnosis not present

## 2022-02-10 DIAGNOSIS — Z1152 Encounter for screening for COVID-19: Secondary | ICD-10-CM | POA: Diagnosis not present

## 2022-02-10 DIAGNOSIS — M549 Dorsalgia, unspecified: Secondary | ICD-10-CM | POA: Diagnosis present

## 2022-02-10 DIAGNOSIS — R309 Painful micturition, unspecified: Secondary | ICD-10-CM | POA: Diagnosis present

## 2022-02-10 DIAGNOSIS — Z66 Do not resuscitate: Secondary | ICD-10-CM | POA: Diagnosis not present

## 2022-02-10 DIAGNOSIS — Z833 Family history of diabetes mellitus: Secondary | ICD-10-CM

## 2022-02-10 DIAGNOSIS — C7951 Secondary malignant neoplasm of bone: Secondary | ICD-10-CM | POA: Diagnosis present

## 2022-02-10 DIAGNOSIS — Z79899 Other long term (current) drug therapy: Secondary | ICD-10-CM

## 2022-02-10 DIAGNOSIS — I2699 Other pulmonary embolism without acute cor pulmonale: Principal | ICD-10-CM | POA: Diagnosis present

## 2022-02-10 DIAGNOSIS — J90 Pleural effusion, not elsewhere classified: Secondary | ICD-10-CM | POA: Diagnosis not present

## 2022-02-10 DIAGNOSIS — Z7189 Other specified counseling: Secondary | ICD-10-CM | POA: Diagnosis not present

## 2022-02-10 DIAGNOSIS — R531 Weakness: Secondary | ICD-10-CM | POA: Diagnosis not present

## 2022-02-10 LAB — CBC WITH DIFFERENTIAL/PLATELET
Abs Immature Granulocytes: 0.07 10*3/uL (ref 0.00–0.07)
Basophils Absolute: 0.1 10*3/uL (ref 0.0–0.1)
Basophils Relative: 1 %
Eosinophils Absolute: 0 10*3/uL (ref 0.0–0.5)
Eosinophils Relative: 0 %
HCT: 30.3 % — ABNORMAL LOW (ref 39.0–52.0)
Hemoglobin: 9 g/dL — ABNORMAL LOW (ref 13.0–17.0)
Immature Granulocytes: 1 %
Lymphocytes Relative: 28 %
Lymphs Abs: 1.5 10*3/uL (ref 0.7–4.0)
MCH: 30.2 pg (ref 26.0–34.0)
MCHC: 29.7 g/dL — ABNORMAL LOW (ref 30.0–36.0)
MCV: 101.7 fL — ABNORMAL HIGH (ref 80.0–100.0)
Monocytes Absolute: 1 10*3/uL (ref 0.1–1.0)
Monocytes Relative: 18 %
Neutro Abs: 2.7 10*3/uL (ref 1.7–7.7)
Neutrophils Relative %: 52 %
Platelets: 164 10*3/uL (ref 150–400)
RBC: 2.98 MIL/uL — ABNORMAL LOW (ref 4.22–5.81)
RDW: 16.6 % — ABNORMAL HIGH (ref 11.5–15.5)
WBC: 5.3 10*3/uL (ref 4.0–10.5)
nRBC: 0 % (ref 0.0–0.2)

## 2022-02-10 LAB — COMPREHENSIVE METABOLIC PANEL
ALT: 10 U/L (ref 0–44)
AST: 13 U/L — ABNORMAL LOW (ref 15–41)
Albumin: 2 g/dL — ABNORMAL LOW (ref 3.5–5.0)
Alkaline Phosphatase: 49 U/L (ref 38–126)
Anion gap: 3 — ABNORMAL LOW (ref 5–15)
BUN: 27 mg/dL — ABNORMAL HIGH (ref 8–23)
CO2: 27 mmol/L (ref 22–32)
Calcium: 7.4 mg/dL — ABNORMAL LOW (ref 8.9–10.3)
Chloride: 109 mmol/L (ref 98–111)
Creatinine, Ser: 0.52 mg/dL — ABNORMAL LOW (ref 0.61–1.24)
GFR, Estimated: >60 mL/min (ref >60)
Glucose, Bld: 111 mg/dL — ABNORMAL HIGH (ref 70–99)
Potassium: 3.6 mmol/L (ref 3.5–5.1)
Sodium: 139 mmol/L (ref 135–145)
Total Bilirubin: 0.3 mg/dL (ref 0.3–1.2)
Total Protein: 5.7 g/dL — ABNORMAL LOW (ref 6.5–8.1)

## 2022-02-10 LAB — TROPONIN I (HIGH SENSITIVITY)
Troponin I (High Sensitivity): 13 ng/L (ref <18)
Troponin I (High Sensitivity): 11 ng/L (ref <18)

## 2022-02-10 LAB — RESP PANEL BY RT-PCR (RSV, FLU A&B, COVID)  RVPGX2
Influenza A by PCR: NEGATIVE
Influenza B by PCR: NEGATIVE
Resp Syncytial Virus by PCR: NEGATIVE
SARS Coronavirus 2 by RT PCR: NEGATIVE

## 2022-02-10 LAB — BRAIN NATRIURETIC PEPTIDE: B Natriuretic Peptide: 383 pg/mL — ABNORMAL HIGH (ref 0.0–100.0)

## 2022-02-10 MED ORDER — MONTELUKAST SODIUM 10 MG PO TABS
10.0000 mg | ORAL_TABLET | Freq: Every day | ORAL | Status: DC
  Administered 2022-02-10 – 2022-02-12 (×3): 10 mg via ORAL
  Filled 2022-02-10 (×3): qty 1

## 2022-02-10 MED ORDER — HEPARIN (PORCINE) 25000 UT/250ML-% IV SOLN
1300.0000 [IU]/h | INTRAVENOUS | Status: DC
  Administered 2022-02-10 – 2022-02-12 (×2): 1300 [IU]/h via INTRAVENOUS
  Filled 2022-02-10 (×2): qty 250

## 2022-02-10 MED ORDER — TAMSULOSIN HCL 0.4 MG PO CAPS
0.4000 mg | ORAL_CAPSULE | Freq: Every day | ORAL | Status: DC
  Administered 2022-02-11 – 2022-02-12 (×2): 0.4 mg via ORAL
  Filled 2022-02-10 (×2): qty 1

## 2022-02-10 MED ORDER — POTASSIUM CHLORIDE 20 MEQ PO PACK
40.0000 meq | PACK | Freq: Once | ORAL | Status: AC
  Administered 2022-02-10: 40 meq via ORAL
  Filled 2022-02-10: qty 2

## 2022-02-10 MED ORDER — POLYETHYLENE GLYCOL 3350 17 G PO PACK: 17.0000 g | PACK | Freq: Every day | ORAL | Status: DC | PRN

## 2022-02-10 MED ORDER — OXYCODONE HCL 5 MG PO TABS
10.0000 mg | ORAL_TABLET | Freq: Three times a day (TID) | ORAL | Status: DC | PRN
  Administered 2022-02-10: 10 mg via ORAL
  Filled 2022-02-10 (×2): qty 2

## 2022-02-10 MED ORDER — GABAPENTIN 400 MG PO CAPS
400.0000 mg | ORAL_CAPSULE | Freq: Two times a day (BID) | ORAL | Status: DC
  Administered 2022-02-10 – 2022-02-13 (×6): 400 mg via ORAL
  Filled 2022-02-10 (×6): qty 1

## 2022-02-10 MED ORDER — HEPARIN BOLUS VIA INFUSION
4500.0000 [IU] | Freq: Once | INTRAVENOUS | Status: AC
  Administered 2022-02-10: 4500 [IU] via INTRAVENOUS

## 2022-02-10 MED ORDER — IOHEXOL 350 MG/ML SOLN
60.0000 mL | Freq: Once | INTRAVENOUS | Status: AC | PRN
  Administered 2022-02-10: 60 mL via INTRAVENOUS

## 2022-02-10 MED ORDER — TRAZODONE HCL 50 MG PO TABS
300.0000 mg | ORAL_TABLET | Freq: Every day | ORAL | Status: DC
  Administered 2022-02-10 – 2022-02-12 (×3): 300 mg via ORAL
  Filled 2022-02-10 (×3): qty 6

## 2022-02-10 MED ORDER — ACYCLOVIR 800 MG PO TABS
400.0000 mg | ORAL_TABLET | Freq: Two times a day (BID) | ORAL | Status: DC
  Administered 2022-02-10 – 2022-02-13 (×6): 400 mg via ORAL
  Filled 2022-02-10 (×6): qty 1

## 2022-02-10 MED ORDER — DEXAMETHASONE 4 MG PO TABS
4.0000 mg | ORAL_TABLET | ORAL | Status: DC
  Administered 2022-02-11 – 2022-02-13 (×2): 4 mg via ORAL
  Filled 2022-02-10 (×2): qty 1

## 2022-02-10 MED ORDER — ACETAMINOPHEN 650 MG RE SUPP: 650.0000 mg | Freq: Four times a day (QID) | RECTAL | Status: DC | PRN

## 2022-02-10 MED ORDER — ACETAMINOPHEN 325 MG PO TABS: 650.0000 mg | ORAL_TABLET | Freq: Four times a day (QID) | ORAL | Status: DC | PRN

## 2022-02-10 NOTE — Assessment & Plan Note (Signed)
Resume home oxycodone 10 mg every 8 hourly

## 2022-02-10 NOTE — ED Triage Notes (Signed)
Pt BIB [facility] from home, c/o weakness that has been ongoing. Pt slow to answer questions, A&O X 3.  EMS states pt is a cancer pt. 02 sats 87 % on RA upon arrival. 2 L [place] at this time. Per ems, pt does not wear oxygen at home.

## 2022-02-10 NOTE — ED Provider Notes (Signed)
  Physical Exam  BP (!) 115/58   Pulse (!) 46   Temp 98.1 F (36.7 C) (Oral)   Resp 10   SpO2 96%   Physical Exam  Procedures  Procedures  ED Course / MDM    Medical Decision Making Amount and/or Complexity of Data Reviewed Labs: ordered. Radiology: ordered.  Risk Prescription drug management.   Received patient in signout.  Weakness shortness of breath.  Saturations are 87% upon arrival.  Not on oxygen.  Unfortunately pulm embolisms found on CT scan.  With the hypoxia will require admission to the hospital.  RV to LV ratio was good though.  Will discuss with hospitalist for admission. Particular with the new clots patient may benefit from further palliative care with evaluation. Also potentially has T3 and T11 fractures.  Unknown chronicity but does have pain in this area. I wrote I reviewed the note from December 16 from oncology and the plan was if patient would      Davonna Belling, MD 02/10/22 [phone]

## 2022-02-10 NOTE — Assessment & Plan Note (Signed)
Recurrent multiple myeloma without remission with multiple sites of osseous metastasis.  Currently on palliative treatment.  Per notes 12/7 by radiation oncology-patient with multiple falls, weakness, poor appetite, poor performance status/frailty, increasing difficulty walking, and poor candidate for further chemotherapy.  Patient wanted to continue aggressive treatment.  Plan was to follow-up with [name] for chemotherapy.

## 2022-02-10 NOTE — Assessment & Plan Note (Signed)
Mental status appears back to baseline.  -He is in chronic severe pain, frequently asking for his pain medication -Continue with compassionate use oxycodone 10 mg every 8 hourly

## 2022-02-10 NOTE — ED Notes (Signed)
Pt was trying to exit his bed, removed all leads and gown. NT and RN repositioned pt and instructed to use the call light if needed. Pt verbalized understanding.

## 2022-02-10 NOTE — Assessment & Plan Note (Signed)
Reports pain with urination.  -Resume tamsulosin -UA with budding yeast and leukocytes

## 2022-02-10 NOTE — Assessment & Plan Note (Signed)
Presenting with weakness, dyspnea.  Initial sats 87% on room air. CTA chest- Bilateral PE, largest in the distal portion of left pulmonary artery, extending into upper lobe branches, multiple lytic lesions.  Daughter at bedside, reports multiple falls.  No history of GI bleed. -I have talked to patient's daughter [name] about the risk and benefits of anticoagulation, in this patient with history of multiple falls, multiple myeloma. Daughter [name]- who is HCPOA is aware, they have opted to continue with anticoagulation for now -Agreed with palliative care consult, goals of care discussion - Echocardiogram

## 2022-02-10 NOTE — ED Provider Notes (Signed)
[facility] EMERGENCY DEPARTMENT Provider Note  CSN: [id number] Arrival date & time: 02/10/22 1135  Chief Complaint(s) Weakness  HPI Dean [name]. is a 87 y.o. male with PMH multiple myeloma on active chemotherapy who presents emergency department for evaluation of generalized weakness, hypoxia.  Patient was seen in infusion clinic today and found to be 87% on room air.  He does not wear oxygen at home and does endorse shortness of breath today.  He was transferred from infusion clinic down to the emergency department for further workup.  He is very slow to answer questions and is generally lethargic here in the ER but is maintaining saturations well on 2 L nasal cannula.  Denies abdominal pain, nausea, vomiting or other systemic symptoms.   Past Medical History Past Medical History:  Diagnosis Date   BPH (benign prostatic hyperplasia)    Colonic diverticular abscess 01/08/2015   Diverticulitis 08/65/7846   complicated by abscess and required percutaneous drainage   GERD (gastroesophageal reflux disease)    H/O ETOH abuse    History of chemotherapy last done jan 2017   History of radiation therapy 01/05/13-02/10/13   45 gray to left occipital condyle region   History of radiation therapy 12/01/16-12/10/16   Parasternal nodule, chest- 24 Gy total delivered in 8 fractions, Left sacro-iliac, pelvis- 24 Gy total delivered in 8 fractions    History of radiation therapy 02/19/17-02/22/17   right temporal scalp 30 Gy in 10 fractions   History of radiation therapy    Right hip( Pelvis) 11/18/21-11/21/21-Dr. Gery Pray   Intra-abdominal abscess ([place])    Multiple myeloma ([place]) 2015   Neuropathy    Radiation 02/21/14-03/08/14   right posterior chest wall area 30 gray   Radiation 04/19/14-05/02/14   lumbar spine 25 gray   Skull lesion    Left occipital condyle   Wrist fracture, left    x 2   Patient Active Problem List   Diagnosis Date Noted   Pulmonary embolism ([place]) 02/10/2022    Spinal stenosis, lumbar 10/13/2016   Chronic back pain 10/01/2016   DDD (degenerative disc disease), lumbar 10/01/2016   OA (osteoarthritis) of knee 10/01/2016   Diverticulitis s/p robotic sigmoid colectomy 07/11/2015 07/11/2015   Goals of care, counseling/discussion    Abnormal CT scan, sigmoid colon 04/02/2015   Abnormal PET scan of colon 04/02/2015   Thrombocytopenia ([place]) 01/09/2015   Multiple myeloma without remission ([place]) 02/15/2014   Allergic rhinitis 01/16/2014   History of vertebral stress fracture 01/16/2014   Dermatitis 01/16/2014   Loss of weight 09/05/2013   Insomnia 09/05/2013   Fatigue 05/16/2013   Neck pain 12/07/2012   Skull lesion 11/29/2012   Neuropathy (H[facility])    BPH (benign prostatic hyperplasia)    GERD (gastroesophageal reflux disease)    H/O ETOH abuse    Home Medication(s) Prior to Admission medications   Medication Sig Start Date End Date Taking? Authorizing Provider  acyclovir (ZOVIRAX) 400 MG tablet TAKE 1 TABLET BY MOUTH TWICE A DAY 07/24/21  Yes Derek Jack, MD  ALPRAZolam (XANAX) 1 MG tablet TAKE 1 TABLET BY MOUTH 3 TIMES DAILY AS NEEDED FOR ANXIETY 12/04/21  Yes Lloyd Huger, MD  CALCIUM-VITAMIN D PO Take 1 tablet by mouth 2 (two) times daily.    Yes [provider]  dexamethasone (DECADRON) 4 MG tablet TAKE 1 TABLET BY MOUTH ONCE A DAY Patient taking differently: Take 4 mg by mouth See admin instructions. Take 4 mg by mouth every other day. 01/08/22  Yes Lloyd Huger, MD  fluticasone (FLONASE) 50 MCG/ACT nasal spray Place 2 sprays into both nostrils daily. 10/31/20  Yes Noemi Chapel A, NP  furosemide (LASIX) 20 MG tablet TAKE 1 TABLET BY MOUTH ONCE A DAY AS NEEDED 03/14/20  Yes Derek Jack, MD  gabapentin (NEURONTIN) 400 MG capsule TAKE 1 CAPSULE BY MOUTH 3 TIMES A DAY Patient taking differently: Take 400 mg by mouth 3 (three) times daily. Pt takes twice daily 10/03/21  Yes Finnegan, Kathlene November, MD  Homeopathic  Products ([place]) Apply topically as needed.   Yes [provider]  ibuprofen (ADVIL) 600 MG tablet Take 1 tablet (600 mg total) by mouth every 8 (eight) hours as needed. 11/18/21  Yes Lloyd Huger, MD  lidocaine-prilocaine (EMLA) cream Apply small amount over port one (1) hour prior to appointment. 09/17/19  Yes Derek Jack, MD  montelukast (SINGULAIR) 10 MG tablet TAKE 1 TABLET BY MOUTH EVERY NIGHT AT BEDTIME 08/27/21  Yes [name], MD  pomalidomide (POMALYST) 3 MG capsule Take 1 capsule (3 mg total) by mouth daily. Take for 21 days, then hold for 7 days. Repeat every 28 days. 02/05/22  Yes Lloyd Huger, MD  tamsulosin (FLOMAX) 0.4 MG CAPS capsule TAKE ONE CAPSULE BY MOUTH EVERY NIGHT ATBEDTIME 12/09/21  Yes [name], MD  traZODone (DESYREL) 100 MG tablet Take 3 tablets (300 mg total) by mouth at bedtime. 01/08/22  Yes Lloyd Huger, MD  TURMERIC PO Take 1 tablet by mouth 2 (two) times daily.   Yes [provider]  zolpidem (AMBIEN) 10 MG tablet Take 1 tablet (10 mg total) by mouth at bedtime as needed for sleep (do not take with xanax). 01/02/22 02/10/22 Yes [name], MD  amoxicillin-clavulanate (AUGMENTIN) 875-125 MG tablet Take 1 tablet by mouth 2 (two) times daily. Patient not taking: Reported on 02/10/2022 01/08/22   Lloyd Huger, MD  dexamethasone (DECADRON) 4 MG tablet TAKE 1 TABLET BY MOUTH ONCE A DAY Patient not taking: Reported on 02/10/2022 11/21/21   Lloyd Huger, MD  neomycin-polymyxin-hydrocortisone (CORTISPORIN) OTIC solution Place 3 drops into the left ear 4 (four) times daily. Patient not taking: Reported on 02/10/2022 01/08/22   Lloyd Huger, MD  Oxycodone HCl 10 MG TABS TAKE 1 TO 2 TABLETS BY MOUTH 3 TIMES DAILY AS NEEDED 02/10/22   Lloyd Huger, MD  predniSONE (DELTASONE) 20 MG tablet Take 2 tablets (40 mg total) by mouth daily with breakfast. Patient not taking: Reported on  02/10/2022 11/12/21   [name], MD                                                                                                                                    Past Surgical History Past Surgical History:  Procedure Laterality Date   COLONOSCOPY N/A 04/19/2015   Procedure: COLONOSCOPY;  Surgeon: Danie Binder, MD;  Location: [facility];  Service: Endoscopy;  Laterality: N/A;  1:30 PM   CYST EXCISION  1959   tail bone   IR IMAGING GUIDED PORT INSERTION  09/25/2017   Family History Family History  Problem Relation Age of Onset   Diabetes Mother    Stroke Mother    Hypertension Father     Social History Social History   Tobacco Use   Smoking status: Never   Smokeless tobacco: Never  Vaping Use   Vaping Use: Never used  Substance Use Topics   Alcohol use: No    Comment: " Not much no more"   Drug use: No   Allergies Codeine, Morphine and related, and Revlimid [lenalidomide]  Review of Systems Review of Systems  Respiratory:  Positive for cough and shortness of breath.     Physical Exam Vital Signs  I have reviewed the triage vital signs BP (!) 113/47   Pulse 62   Temp 98.1 F (36.7 C) (Oral)   Resp 15   SpO2 92%   Physical Exam Constitutional:      General: He is not in acute distress.    Appearance: Normal appearance.  HENT:     Head: Normocephalic and atraumatic.     Nose: No congestion or rhinorrhea.  Eyes:     General:        Right eye: No discharge.        Left eye: No discharge.     Extraocular Movements: Extraocular movements intact.     Pupils: Pupils are equal, round, and reactive to light.  Cardiovascular:     Rate and Rhythm: Normal rate and regular rhythm.     Heart sounds: No murmur heard. Pulmonary:     Effort: No respiratory distress.     Breath sounds: No wheezing or rales.  Abdominal:     General: There is no distension.     Tenderness: There is no abdominal tenderness.  Musculoskeletal:        General: Normal range  of motion.     Cervical back: Normal range of motion.  Skin:    General: Skin is warm and dry.  Neurological:     General: No focal deficit present.     Mental Status: He is alert.     ED Results and Treatments Labs (all labs ordered are listed, but only abnormal results are displayed) Labs Reviewed  COMPREHENSIVE METABOLIC PANEL - Abnormal; Notable for the following components:      Result Value   Glucose, Bld 111 (*)    BUN 27 (*)    Creatinine, Ser 0.52 (*)    Calcium 7.4 (*)    Total Protein 5.7 (*)    Albumin 2.0 (*)    AST 13 (*)    Anion gap 3 (*)    All other components within normal limits  CBC WITH DIFFERENTIAL/PLATELET - Abnormal; Notable for the following components:   RBC 2.98 (*)    Hemoglobin 9.0 (*)    HCT 30.3 (*)    MCV 101.7 (*)    MCHC 29.7 (*)    RDW 16.6 (*)    All other components within normal limits  BRAIN NATRIURETIC PEPTIDE - Abnormal; Notable for the following components:   B Natriuretic Peptide 383.0 (*)    All other components within normal limits  RESP PANEL BY RT-PCR (RSV, FLU A&B, COVID)  RVPGX2  TROPONIN I (HIGH SENSITIVITY)  TROPONIN I (HIGH SENSITIVITY)  Radiology CT Angio Chest PE W and/or Wo Contrast  Result Date: 02/10/2022 CLINICAL DATA:  Shortness of breath.  History of multiple myeloma. EXAM: CT ANGIOGRAPHY CHEST WITH CONTRAST TECHNIQUE: Multidetector CT imaging of the chest was performed using the standard protocol during bolus administration of intravenous contrast. Multiplanar CT image reconstructions and MIPs were obtained to evaluate the vascular anatomy. RADIATION DOSE REDUCTION: This exam was performed according to the departmental dose-optimization program which includes automated exposure control, adjustment of the mA and/or kV according to patient size and/or use of iterative reconstruction technique.  CONTRAST:  73m OMNIPAQUE IOHEXOL 350 MG/ML SOLN COMPARISON:  PET scan of February 17, 2017. CT scan of November 30, 2012. FINDINGS: Cardiovascular: Filling defects is seen in the distal left pulmonary artery which extends into upper lobe branches. Filling defect is also noted in upper lobe branch of the right pulmonary artery. RV/LV ratio of 0.8 is noted which is within normal limits. Normal cardiac size. No pericardial effusion. Coronary artery calcifications are noted. Atherosclerosis of thoracic aorta is noted without aneurysm or dissection. Mediastinum/Nodes: No enlarged mediastinal, hilar, or axillary lymph nodes. Thyroid gland, trachea, and esophagus demonstrate no significant findings. Lungs/Pleura: Small bilateral pleural effusions are noted with adjacent subsegmental atelectasis. No pneumothorax is noted. Right upper lobe opacity is noted most consistent with scarring or possibly atelectasis. Upper Abdomen: No acute abnormality. Musculoskeletal: Multiple old bilateral rib fractures are noted. Lytic lesions are noted in the manubrium and sternum consistent with history of multiple myeloma. Lytic lesions are also noted in multiple thoracic vertebral bodies consistent with multiple myeloma. T3 and T11 compression fractures are noted most likely representing pathologic fractures of indeterminate age. Review of the MIP images confirms the above findings. IMPRESSION: Bilateral pulmonary emboli are noted, the largest being in distal portion of left pulmonary artery extending into upper lobe branches. Critical Value/emergent results were called by telephone at the time of interpretation on 02/10/2022 at 4:08 pm to provider NDavonna Belling who verbally acknowledged these results. Lytic lesions are noted in the manubrium, sternum and thoracic spine at multiple levels consistent with history of multiple myeloma. T3 and T11 compression fractures are noted most likely representing pathologic fractures of indeterminate  age. Multiple probable old bilateral rib fractures are noted. Small bilateral pleural effusions are noted with minimal adjacent subsegmental atelectasis. Right upper lobe opacity is noted most consistent scarring or possibly atelectasis. Coronary artery calcifications are noted suggesting coronary disease. Aortic Atherosclerosis (ICD10-I70.0). Electronically Signed   By: JMarijo ConceptionM.D.   On: 02/10/2022 16:08   DG Chest Port 1 View  Result Date: 02/10/2022 CLINICAL DATA:  Shortness of breath EXAM: PORTABLE CHEST 1 VIEW COMPARISON:  Bone scan 09/19/2021.  Old x-ray 2016 FINDINGS: Right IJ chest port in place with tip along the SVC right atrial junction. Overlapping cardiac leads. Normal cardiopericardial silhouette. Calcified and tortuous aorta. Mild interstitial prominence. There is some bandlike opacity at the left lung base likely scar or atelectasis. Apical pleural thickening. No pneumothorax or effusion. There are some lytic lesions involving several ribs as well as several other bony structures including the right scapula, left scapula, right humerus. Please correlate with separate prior bone scan IMPRESSION: Mild basilar atelectasis on the left.  Chest port. Multiple lytic bone lesions as per patient's history Electronically Signed   By: AJill SideM.D.   On: 02/10/2022 12:31    Pertinent labs & imaging results that were available during my care of the patient were reviewed by me and considered  in my medical decision making (see MDM for details).  Medications Ordered in ED Medications  iohexol (OMNIPAQUE) 350 MG/ML injection 60 mL (60 mLs Intravenous Contrast Given 02/10/22 1552)                                                                                                                                     Procedures .Critical Care  Performed by: Teressa Lower, MD Authorized by: Teressa Lower, MD   Critical care provider statement:    Critical care time (minutes):  30   Critical  care was necessary to treat or prevent imminent or life-threatening deterioration of the following conditions:  Respiratory failure   Critical care was time spent personally by me on the following activities:  Development of treatment plan with patient or surrogate, discussions with consultants, evaluation of patient's response to treatment, examination of patient, ordering and review of laboratory studies, ordering and review of radiographic studies, ordering and performing treatments and interventions, pulse oximetry, re-evaluation of patient's condition and review of old charts   (including critical care time)  Medical Decision Making / ED Course   This patient presents to the ED for concern of shortness of breath hypoxia, this involves an extensive number of treatment options, and is a complaint that carries with it a high risk of complications and morbidity.  The differential diagnosis includes PE, pneumonia, worsening tumor burden, pleural effusion  MDM: Patient seen in the emergency room for evaluation of shortness of breath and new oxygen requirement.  Physical exam with some mild tachypnea and the patient that is very slow to answer questions but is otherwise unremarkable.  Laboratory evaluation with a hemoglobin of 9.0 with an MCV of 101.7, albumin 2.0, high-sensitivity troponin normal with BNP 383.0.  COVID flu, RSV all negative.  Chest x-ray with lytic disease but no obvious source of his hypoxia.  At time of signout, patient pending PE study.  Please see provider signout for continuation of workup.  Anticipate admission for new oxygen requirement.   Additional history obtained:  -External records from outside source obtained and reviewed including: Chart review including previous notes, labs, imaging, consultation notes   Lab Tests: -I ordered, reviewed, and interpreted labs.   The pertinent results include:   Labs Reviewed  COMPREHENSIVE METABOLIC PANEL - Abnormal; Notable for  the following components:      Result Value   Glucose, Bld 111 (*)    BUN 27 (*)    Creatinine, Ser 0.52 (*)    Calcium 7.4 (*)    Total Protein 5.7 (*)    Albumin 2.0 (*)    AST 13 (*)    Anion gap 3 (*)    All other components within normal limits  CBC WITH DIFFERENTIAL/PLATELET - Abnormal; Notable for the following components:   RBC 2.98 (*)    Hemoglobin 9.0 (*)    HCT 30.3 (*)    MCV 101.7 (*)  MCHC 29.7 (*)    RDW 16.6 (*)    All other components within normal limits  BRAIN NATRIURETIC PEPTIDE - Abnormal; Notable for the following components:   B Natriuretic Peptide 383.0 (*)    All other components within normal limits  RESP PANEL BY RT-PCR (RSV, FLU A&B, COVID)  RVPGX2  TROPONIN I (HIGH SENSITIVITY)  TROPONIN I (HIGH SENSITIVITY)       Imaging Studies ordered: I ordered imaging studies including chest x-ray I independently visualized and interpreted imaging. I agree with the radiologist interpretation  CT PE ordered and is pending   Medicines ordered and prescription drug management: Meds ordered this encounter  Medications   iohexol (OMNIPAQUE) 350 MG/ML injection 60 mL    -I have reviewed the patients home medicines and have made adjustments as needed  Critical interventions Oxygen supplementation   Cardiac Monitoring: The patient was maintained on a cardiac monitor.  I personally viewed and interpreted the cardiac monitored which showed an underlying rhythm of: NSR  Social Determinants of Health:  Factors impacting patients care include: none   Reevaluation: After the interventions noted above, I reevaluated the patient and found that they have :improved  Co morbidities that complicate the patient evaluation  Past Medical History:  Diagnosis Date   BPH (benign prostatic hyperplasia)    Colonic diverticular abscess 01/08/2015   Diverticulitis 49/67/5916   complicated by abscess and required percutaneous drainage   GERD (gastroesophageal  reflux disease)    H/O ETOH abuse    History of chemotherapy last done jan 2017   History of radiation therapy 01/05/13-02/10/13   45 gray to left occipital condyle region   History of radiation therapy 12/01/16-12/10/16   Parasternal nodule, chest- 24 Gy total delivered in 8 fractions, Left sacro-iliac, pelvis- 24 Gy total delivered in 8 fractions    History of radiation therapy 02/19/17-02/22/17   right temporal scalp 30 Gy in 10 fractions   History of radiation therapy    Right hip( Pelvis) 11/18/21-11/21/21-Dr. Gery Pray   Intra-abdominal abscess [facility])    Multiple myeloma ([place]) 2015   Neuropathy    Radiation 02/21/14-03/08/14   right posterior chest wall area 30 gray   Radiation 04/19/14-05/02/14   lumbar spine 25 gray   Skull lesion    Left occipital condyle   Wrist fracture, left    x 2      Dispostion: I considered admission for this patient, and disposition pending PE study.  Anticipate admission for new oxygen requirement     Final Clinical Impression(s) / ED Diagnoses Final diagnoses:  Acute pulmonary embolism without acute cor pulmonale, unspecified pulmonary embolism type [place])     '@PCDICTATION'$ @    Teressa Lower, MD 02/10/22 1819

## 2022-02-10 NOTE — Progress Notes (Signed)
ANTICOAGULATION CONSULT NOTE - Initial Consult  Pharmacy Consult for IV Heparin Indication: pulmonary embolus  Allergies  Allergen Reactions   Codeine Other (See Comments)    Headache   Morphine And Related Other (See Comments)    Makes him feel weird   Revlimid [Lenalidomide] Other (See Comments)    "Causes me to become weak"    Patient Measurements:  Weight - 72.5 kg (on 01/09/22 Height - 5'8" Heparin Dosing Weight: 72.5 kg  Vital Signs: Temp: 95.7 F (35.4 C) (01/08 1945) Temp Source: Oral (01/08 1945) BP: 106/47 (01/08 1930) Pulse Rate: 53 (01/08 1945)  Labs: Recent Labs    02/10/22 1306 02/10/22 1558  HGB 9.0*  --   HCT 30.3*  --   PLT 164  --   CREATININE 0.52*  --   TROPONINIHS 13 11    CrCl cannot be calculated (Unknown ideal weight.).   Medical History: Past Medical History:  Diagnosis Date   BPH (benign prostatic hyperplasia)    Colonic diverticular abscess 01/08/2015   Diverticulitis 18/29/9371   complicated by abscess and required percutaneous drainage   GERD (gastroesophageal reflux disease)    H/O ETOH abuse    History of chemotherapy last done jan 2017   History of radiation therapy 01/05/13-02/10/13   45 gray to left occipital condyle region   History of radiation therapy 12/01/16-12/10/16   Parasternal nodule, chest- 24 Gy total delivered in 8 fractions, Left sacro-iliac, pelvis- 24 Gy total delivered in 8 fractions    History of radiation therapy 02/19/17-02/22/17   right temporal scalp 30 Gy in 10 fractions   History of radiation therapy    Right hip( Pelvis) 11/18/21-11/21/21-Dr. Gery Pray   Intra-abdominal abscess [facility])    Multiple myeloma ([place]) 2015   Neuropathy    Radiation 02/21/14-03/08/14   right posterior chest wall area 30 gray   Radiation 04/19/14-05/02/14   lumbar spine 25 gray   Skull lesion    Left occipital condyle   Wrist fracture, left    x 2    Medications:  (Not in a hospital admission)  No anticoagulation prior  to admission  Assessment: 87 years of age male who presented with dypsnea and weakness and low oxygen saturations found to have bilateral PE on CTA chest with largest in distal portion of left pulmonary artery, extending into upper lobe branches and multiple lytic lesions. Patient was not on anticoagulation prior to admission.   H/H low at 9/30.3, Platelets 164 - stable from prior labs. No bleeding reported PTA or since admission.   Goal of Therapy:  Heparin level 0.3-0.7 units/ml Monitor platelets by anticoagulation protocol: Yes   Plan:  Give 4500 units bolus x 1 Start heparin infusion at 1300 units/hr Check anti-Xa level in 8 hours and daily while on heparin Continue to monitor H&H and platelets  Sloan Leiter, PharmD, BCPS, BCCCP Please refer to [facility] for [place] numbers 02/10/2022,7:56 PM

## 2022-02-10 NOTE — H&P (Signed)
History and Physical    Dean [name]. ZOX:[id number] DOB: [birth year] DOA: 02/10/2022  PCP: [name], MD   Patient coming from: Home  I have personally briefly reviewed patient's old medical records in [place]  Chief Complaint: Weakness  HPI: Dean [name]. is a 87 y.o. male with medical history significant for multiple myeloma. Patient was brought to the ED with reports of weakness, not responding to questions, feeling sleepy.  Of my evaluation patient is awake alert and oriented and able to answer questions appropriately.  Daughter- Dean [name], is at bedside. Daughter also reports increased work of breathing today.  Patient reports chest tightness without chest pain.  Mild swelling to ankles bilaterally.  No vomiting no loose stools.  No history of GI bleeds.  Daughter reports history of frequent falls, over the past 3 days, patient has fallen 3 times.  He denies hitting his head.  ED Course: Temperature to 7.5.  Heart rate 46-62.  Respiratory rate 10-17.  O2 sats initially 89% on room air.  Blood pressure systolic 811-914. Troponin 13 > 11.  BNP 383. CTA chest- Bilateral PE, largest in the distal portion of left pulmonary artery, extending into upper lobe branches, multiple lytic lesions, T3 and T11 compression fractures-likely pathologic fractures, multiple probable old bilateral rib fractures.  Review of Systems: As per HPI all other systems reviewed and negative.  Past Medical History:  Diagnosis Date   BPH (benign prostatic hyperplasia)    Colonic diverticular abscess 01/08/2015   Diverticulitis 78/29/5621   complicated by abscess and required percutaneous drainage   GERD (gastroesophageal reflux disease)    H/O ETOH abuse    History of chemotherapy last done jan 2017   History of radiation therapy 01/05/13-02/10/13   45 gray to left occipital condyle region   History of radiation therapy 12/01/16-12/10/16   Parasternal nodule, chest- 24 Gy total delivered in 8  fractions, Left sacro-iliac, pelvis- 24 Gy total delivered in 8 fractions    History of radiation therapy 02/19/17-02/22/17   right temporal scalp 30 Gy in 10 fractions   History of radiation therapy    Right hip( Pelvis) 11/18/21-11/21/21-Dr. Gery Pray   Intra-abdominal abscess ([place])    Multiple myeloma ([place]) 2015   Neuropathy    Radiation 02/21/14-03/08/14   right posterior chest wall area 30 gray   Radiation 04/19/14-05/02/14   lumbar spine 25 gray   Skull lesion    Left occipital condyle   Wrist fracture, left    x 2    Past Surgical History:  Procedure Laterality Date   COLONOSCOPY N/A 04/19/2015   Procedure: COLONOSCOPY;  Surgeon: Danie Binder, MD;  Location: [facility];  Service: Endoscopy;  Laterality: N/A;  1:30 PM   CYST EXCISION  1959   tail bone   IR IMAGING GUIDED PORT INSERTION  09/25/2017     reports that he has never smoked. He has never used smokeless tobacco. He reports that he does not drink alcohol and does not use drugs.  Allergies  Allergen Reactions   Codeine Other (See Comments)    Headache   Morphine And Related Other (See Comments)    Makes him feel weird   Revlimid [Lenalidomide] Other (See Comments)    "Causes me to become weak"    Family History  Problem Relation Age of Onset   Diabetes Mother    Stroke Mother    Hypertension Father     Prior to Admission medications   Medication  Sig Start Date End Date Taking? Authorizing Provider  acyclovir (ZOVIRAX) 400 MG tablet TAKE 1 TABLET BY MOUTH TWICE A DAY 07/24/21  Yes Derek Jack, MD  ALPRAZolam (XANAX) 1 MG tablet TAKE 1 TABLET BY MOUTH 3 TIMES DAILY AS NEEDED FOR ANXIETY 12/04/21  Yes Lloyd Huger, MD  CALCIUM-VITAMIN D PO Take 1 tablet by mouth 2 (two) times daily.    Yes [provider]  dexamethasone (DECADRON) 4 MG tablet TAKE 1 TABLET BY MOUTH ONCE A DAY Patient taking differently: Take 4 mg by mouth See admin instructions. Take 4 mg by mouth every other day.  01/08/22  Yes Lloyd Huger, MD  fluticasone (FLONASE) 50 MCG/ACT nasal spray Place 2 sprays into both nostrils daily. 10/31/20  Yes Noemi Chapel A, NP  furosemide (LASIX) 20 MG tablet TAKE 1 TABLET BY MOUTH ONCE A DAY AS NEEDED 03/14/20  Yes Derek Jack, MD  gabapentin (NEURONTIN) 400 MG capsule TAKE 1 CAPSULE BY MOUTH 3 TIMES A DAY Patient taking differently: Take 400 mg by mouth 3 (three) times daily. Pt takes twice daily 10/03/21  Yes Finnegan, Kathlene November, MD  Homeopathic Products ([place]) Apply topically as needed.   Yes [provider]  ibuprofen (ADVIL) 600 MG tablet Take 1 tablet (600 mg total) by mouth every 8 (eight) hours as needed. 11/18/21  Yes Lloyd Huger, MD  lidocaine-prilocaine (EMLA) cream Apply small amount over port one (1) hour prior to appointment. 09/17/19  Yes Derek Jack, MD  montelukast (SINGULAIR) 10 MG tablet TAKE 1 TABLET BY MOUTH EVERY NIGHT AT BEDTIME 08/27/21  Yes [name], MD  pomalidomide (POMALYST) 3 MG capsule Take 1 capsule (3 mg total) by mouth daily. Take for 21 days, then hold for 7 days. Repeat every 28 days. 02/05/22  Yes Lloyd Huger, MD  tamsulosin (FLOMAX) 0.4 MG CAPS capsule TAKE ONE CAPSULE BY MOUTH EVERY NIGHT ATBEDTIME 12/09/21  Yes [name], MD  traZODone (DESYREL) 100 MG tablet Take 3 tablets (300 mg total) by mouth at bedtime. 01/08/22  Yes Lloyd Huger, MD  TURMERIC PO Take 1 tablet by mouth 2 (two) times daily.   Yes [provider]  zolpidem (AMBIEN) 10 MG tablet Take 1 tablet (10 mg total) by mouth at bedtime as needed for sleep (do not take with xanax). 01/02/22 02/10/22 Yes [name], MD  amoxicillin-clavulanate (AUGMENTIN) 875-125 MG tablet Take 1 tablet by mouth 2 (two) times daily. Patient not taking: Reported on 02/10/2022 01/08/22   Lloyd Huger, MD  dexamethasone (DECADRON) 4 MG tablet TAKE 1 TABLET BY MOUTH ONCE A DAY Patient not taking:  Reported on 02/10/2022 11/21/21   Lloyd Huger, MD  neomycin-polymyxin-hydrocortisone (CORTISPORIN) OTIC solution Place 3 drops into the left ear 4 (four) times daily. Patient not taking: Reported on 02/10/2022 01/08/22   Lloyd Huger, MD  Oxycodone HCl 10 MG TABS TAKE 1 TO 2 TABLETS BY MOUTH 3 TIMES DAILY AS NEEDED 02/10/22   Lloyd Huger, MD  predniSONE (DELTASONE) 20 MG tablet Take 2 tablets (40 mg total) by mouth daily with breakfast. Patient not taking: Reported on 02/10/2022 11/12/21   [name], MD    Physical Exam: Vitals:   02/10/22 1230 02/10/22 1330 02/10/22 1430 02/10/22 1530  BP: (!) 100/47 (!) 107/51 (!) 110/44 (!) 115/58  Pulse: (!) 45 (!) 49 (!) 52 (!) 46  Resp: '17 13 10 10  '$ Temp:    98.1 F (  36.7 C)  TempSrc:    Oral  SpO2: 95% 96% 91% 96%    Constitutional: NAD, calm, comfortable Vitals:   02/10/22 1230 02/10/22 1330 02/10/22 1430 02/10/22 1530  BP: (!) 100/47 (!) 107/51 (!) 110/44 (!) 115/58  Pulse: (!) 45 (!) 49 (!) 52 (!) 46  Resp: '17 13 10 10  '$ Temp:    98.1 F (36.7 C)  TempSrc:    Oral  SpO2: 95% 96% 91% 96%   Eyes: PERRL, lids and conjunctivae normal ENMT: Mucous membranes are moist.  Neck: normal, supple, no masses, no thyromegaly Respiratory: clear to auscultation bilaterally, no wheezing, no crackles. Normal respiratory effort. No accessory muscle use.  Cardiovascular: Regular rate and rhythm, no murmurs / rubs / gallops. No extremity edema.  Extremities warm. Abdomen: no tenderness, no masses palpated. No hepatosplenomegaly. Bowel sounds positive.  Musculoskeletal: no clubbing / cyanosis. No joint deformity upper and lower extremities.  Skin: no rashes, lesions, ulcers. No induration Neurologic: No apparent cranial nerve abnormalities, moving extremities spontaneously. Psychiatric: Normal judgment and insight. Alert and oriented x 3. Normal mood.   Labs on Admission: I have personally reviewed following labs and imaging  studies  CBC: Recent Labs  Lab 02/10/22 1306  WBC 5.3  NEUTROABS 2.7  HGB 9.0*  HCT 30.3*  MCV 101.7*  PLT 062   Basic Metabolic Panel: Recent Labs  Lab 02/10/22 1306  NA 139  K 3.6  CL 109  CO2 27  GLUCOSE 111*  BUN 27*  CREATININE 0.52*  CALCIUM 7.4*   GFR: CrCl cannot be calculated (Unknown ideal weight.). Liver Function Tests: Recent Labs  Lab 02/10/22 1306  AST 13*  ALT 10  ALKPHOS 49  BILITOT 0.3  PROT 5.7*  ALBUMIN 2.0*    Radiological Exams on Admission: CT Angio Chest PE W and/or Wo Contrast  Result Date: 02/10/2022 CLINICAL DATA:  Shortness of breath.  History of multiple myeloma. EXAM: CT ANGIOGRAPHY CHEST WITH CONTRAST TECHNIQUE: Multidetector CT imaging of the chest was performed using the standard protocol during bolus administration of intravenous contrast. Multiplanar CT image reconstructions and MIPs were obtained to evaluate the vascular anatomy. RADIATION DOSE REDUCTION: This exam was performed according to the departmental dose-optimization program which includes automated exposure control, adjustment of the mA and/or kV according to patient size and/or use of iterative reconstruction technique. CONTRAST:  41m OMNIPAQUE IOHEXOL 350 MG/ML SOLN COMPARISON:  PET scan of February 17, 2017. CT scan of November 30, 2012. FINDINGS: Cardiovascular: Filling defects is seen in the distal left pulmonary artery which extends into upper lobe branches. Filling defect is also noted in upper lobe branch of the right pulmonary artery. RV/LV ratio of 0.8 is noted which is within normal limits. Normal cardiac size. No pericardial effusion. Coronary artery calcifications are noted. Atherosclerosis of thoracic aorta is noted without aneurysm or dissection. Mediastinum/Nodes: No enlarged mediastinal, hilar, or axillary lymph nodes. Thyroid gland, trachea, and esophagus demonstrate no significant findings. Lungs/Pleura: Small bilateral pleural effusions are noted with adjacent  subsegmental atelectasis. No pneumothorax is noted. Right upper lobe opacity is noted most consistent with scarring or possibly atelectasis. Upper Abdomen: No acute abnormality. Musculoskeletal: Multiple old bilateral rib fractures are noted. Lytic lesions are noted in the manubrium and sternum consistent with history of multiple myeloma. Lytic lesions are also noted in multiple thoracic vertebral bodies consistent with multiple myeloma. T3 and T11 compression fractures are noted most likely representing pathologic fractures of indeterminate age. Review of the MIP images confirms the above  findings. IMPRESSION: Bilateral pulmonary emboli are noted, the largest being in distal portion of left pulmonary artery extending into upper lobe branches. Critical Value/emergent results were called by telephone at the time of interpretation on 02/10/2022 at 4:08 pm to provider Davonna Belling, who verbally acknowledged these results. Lytic lesions are noted in the manubrium, sternum and thoracic spine at multiple levels consistent with history of multiple myeloma. T3 and T11 compression fractures are noted most likely representing pathologic fractures of indeterminate age. Multiple probable old bilateral rib fractures are noted. Small bilateral pleural effusions are noted with minimal adjacent subsegmental atelectasis. Right upper lobe opacity is noted most consistent scarring or possibly atelectasis. Coronary artery calcifications are noted suggesting coronary disease. Aortic Atherosclerosis (ICD10-I70.0). Electronically Signed   By: Marijo Conception M.D.   On: 02/10/2022 16:08   DG Chest Port 1 View  Result Date: 02/10/2022 CLINICAL DATA:  Shortness of breath EXAM: PORTABLE CHEST 1 VIEW COMPARISON:  Bone scan 09/19/2021.  Old x-ray 2016 FINDINGS: Right IJ chest port in place with tip along the SVC right atrial junction. Overlapping cardiac leads. Normal cardiopericardial silhouette. Calcified and tortuous aorta. Mild  interstitial prominence. There is some bandlike opacity at the left lung base likely scar or atelectasis. Apical pleural thickening. No pneumothorax or effusion. There are some lytic lesions involving several ribs as well as several other bony structures including the right scapula, left scapula, right humerus. Please correlate with separate prior bone scan IMPRESSION: Mild basilar atelectasis on the left.  Chest port. Multiple lytic bone lesions as per patient's history Electronically Signed   By: Jill Side M.D.   On: 02/10/2022 12:31    EKG: Pending   Assessment/Plan Principal Problem:   Pulmonary embolism (HCC) Active Problems:   Acute metabolic encephalopathy   BPH (benign prostatic hyperplasia)   Multiple myeloma without remission (HCC)   Chronic back pain  Assessment and Plan: * Pulmonary embolism ([place]) Presenting with weakness, dyspnea.  Initial sats 87% on room air. CTA chest- Bilateral PE, largest in the distal portion of left pulmonary artery, extending into upper lobe branches, multiple lytic lesions.  Daughter at bedside, reports multiple falls.  No history of GI bleed. -I have talked to patient's daughter Dean [name] about the risk and benefits of anticoagulation, in this patient with history of multiple falls, multiple myeloma. Daughter Dean [name]- who is HCPOA is aware, they have opted to continue with anticoagulation for now -Agreed with palliative care consult, goals of care discussion - Echocardiogram  Acute metabolic encephalopathy Mental status appears back to baseline.  Earlier today he was sleepy, barely responding to family.  He is taking 2 of his '10mg'$  oxycodone.  -He is in chronic severe pain, currently asking for his pain medication -Continue with oxycodone 10 mg every 8 hourly   Chronic back pain Resume home oxycodone 10 mg every 8 hourly  Multiple myeloma without remission (HCC) Recurrent multiple myeloma without remission with multiple sites of osseous metastasis.   Currently on palliative treatment.  Per notes 12/7 by radiation oncology-patient with multiple falls, weakness, poor appetite, poor performance status/frailty, increasing difficulty walking, and poor candidate for further chemotherapy.  Patient wanted to continue aggressive treatment.  Plan was to follow-up with [name] for chemotherapy.  BPH (benign prostatic hyperplasia) Reports pain with urination.  -Resume tamsulosin -Check UA    DVT prophylaxis: Heparin Code Status: DNR- confirmed with patient, daughter Dean [name], and daughter Dean [name] on the phone at bedside. Family Communication: Daughter Dena at bedside, later patient's daughter  [facility], on the phone. Disposition Plan: ~ 2 days Consults called: Paliative Admission status: inpt tele I certify that at the point of admission it is my clinical judgment that the patient will require inpatient hospital care spanning beyond 2 midnights from the point of admission due to high intensity of service, high risk for further deterioration and high frequency of surveillance required.   Author: Bethena Roys, MD 02/10/2022 8:13 PM  For on call review [email or web address].

## 2022-02-10 NOTE — ED Notes (Signed)
Lab contacted to update results for blood work

## 2022-02-11 ENCOUNTER — Inpatient Hospital Stay (HOSPITAL_COMMUNITY): Payer: Medicare Other

## 2022-02-11 ENCOUNTER — Encounter (HOSPITAL_COMMUNITY): Payer: Self-pay | Admitting: Internal Medicine

## 2022-02-11 ENCOUNTER — Telehealth: Payer: Self-pay | Admitting: Oncology

## 2022-02-11 DIAGNOSIS — I2699 Other pulmonary embolism without acute cor pulmonale: Secondary | ICD-10-CM | POA: Diagnosis not present

## 2022-02-11 DIAGNOSIS — Z515 Encounter for palliative care: Secondary | ICD-10-CM | POA: Diagnosis not present

## 2022-02-11 DIAGNOSIS — C9000 Multiple myeloma not having achieved remission: Secondary | ICD-10-CM | POA: Diagnosis not present

## 2022-02-11 DIAGNOSIS — Z7189 Other specified counseling: Secondary | ICD-10-CM | POA: Diagnosis not present

## 2022-02-11 DIAGNOSIS — I2609 Other pulmonary embolism with acute cor pulmonale: Secondary | ICD-10-CM | POA: Diagnosis not present

## 2022-02-11 LAB — BASIC METABOLIC PANEL
Anion gap: 6 (ref 5–15)
BUN: 22 mg/dL (ref 8–23)
CO2: 25 mmol/L (ref 22–32)
Calcium: 7.2 mg/dL — ABNORMAL LOW (ref 8.9–10.3)
Chloride: 109 mmol/L (ref 98–111)
Creatinine, Ser: 0.5 mg/dL — ABNORMAL LOW (ref 0.61–1.24)
GFR, Estimated: >60 mL/min (ref >60)
Glucose, Bld: 77 mg/dL (ref 70–99)
Potassium: 4 mmol/L (ref 3.5–5.1)
Sodium: 140 mmol/L (ref 135–145)

## 2022-02-11 LAB — ECHOCARDIOGRAM COMPLETE
Area-P 1/2: 4.31 cm2
MV M vel: 4.07 m/s
MV Peak grad: 66.3 mmHg
MV VTI: 2.31 cm2
S' Lateral: 3.9 cm
Weight: 2490.32 oz

## 2022-02-11 LAB — CBC
HCT: 29 % — ABNORMAL LOW (ref 39.0–52.0)
HCT: 31.4 % — ABNORMAL LOW (ref 39.0–52.0)
Hemoglobin: 8.8 g/dL — ABNORMAL LOW (ref 13.0–17.0)
Hemoglobin: 9.8 g/dL — ABNORMAL LOW (ref 13.0–17.0)
MCH: 30.2 pg (ref 26.0–34.0)
MCH: 31.2 pg (ref 26.0–34.0)
MCHC: 30.3 g/dL (ref 30.0–36.0)
MCHC: 31.2 g/dL (ref 30.0–36.0)
MCV: 99.7 fL (ref 80.0–100.0)
MCV: 100 fL (ref 80.0–100.0)
Platelets: 173 10*3/uL (ref 150–400)
Platelets: 171 10*3/uL (ref 150–400)
RBC: 2.91 MIL/uL — ABNORMAL LOW (ref 4.22–5.81)
RBC: 3.14 MIL/uL — ABNORMAL LOW (ref 4.22–5.81)
RDW: 16.7 % — ABNORMAL HIGH (ref 11.5–15.5)
RDW: 17.1 % — ABNORMAL HIGH (ref 11.5–15.5)
WBC: 4.5 10*3/uL (ref 4.0–10.5)
WBC: 4.5 10*3/uL (ref 4.0–10.5)
nRBC: 0 % (ref 0.0–0.2)
nRBC: 0 % (ref 0.0–0.2)

## 2022-02-11 LAB — URINALYSIS, ROUTINE W REFLEX MICROSCOPIC
Bacteria, UA: NONE SEEN
Bilirubin Urine: NEGATIVE
Glucose, UA: NEGATIVE mg/dL
Hgb urine dipstick: NEGATIVE
Ketones, ur: NEGATIVE mg/dL
Nitrite: NEGATIVE
Protein, ur: 30 mg/dL — AB
Specific Gravity, Urine: 1.032 — ABNORMAL HIGH (ref 1.005–1.030)
WBC, UA: >50 WBC/hpf — ABNORMAL HIGH (ref 0–5)
pH: 6 (ref 5.0–8.0)

## 2022-02-11 LAB — HEPARIN LEVEL (UNFRACTIONATED)
Heparin Unfractionated: 0.54 IU/mL (ref 0.30–0.70)
Heparin Unfractionated: 0.7 IU/mL (ref 0.30–0.70)

## 2022-02-11 MED ORDER — VITAMIN C 500 MG PO TABS
500.0000 mg | ORAL_TABLET | Freq: Two times a day (BID) | ORAL | Status: DC
  Administered 2022-02-11 – 2022-02-13 (×4): 500 mg via ORAL
  Filled 2022-02-11 (×4): qty 1

## 2022-02-11 MED ORDER — PERFLUTREN LIPID MICROSPHERE
1.0000 mL | INTRAVENOUS | Status: AC | PRN
  Administered 2022-02-11: 10 mL via INTRAVENOUS

## 2022-02-11 MED ORDER — CHLORHEXIDINE GLUCONATE CLOTH 2 % EX PADS
6.0000 | MEDICATED_PAD | Freq: Every day | CUTANEOUS | Status: DC
  Administered 2022-02-11 – 2022-02-13 (×3): 6 via TOPICAL

## 2022-02-11 MED ORDER — ADULT MULTIVITAMIN W/MINERALS CH
1.0000 | ORAL_TABLET | Freq: Every day | ORAL | Status: DC
  Administered 2022-02-11 – 2022-02-13 (×3): 1 via ORAL
  Filled 2022-02-11 (×4): qty 1

## 2022-02-11 MED ORDER — ENSURE ENLIVE PO LIQD
237.0000 mL | Freq: Two times a day (BID) | ORAL | Status: DC
  Administered 2022-02-12: 237 mL via ORAL

## 2022-02-11 MED ORDER — ZINC SULFATE 220 (50 ZN) MG PO CAPS
220.0000 mg | ORAL_CAPSULE | Freq: Every day | ORAL | Status: DC
  Administered 2022-02-11 – 2022-02-13 (×3): 220 mg via ORAL
  Filled 2022-02-11 (×3): qty 1

## 2022-02-11 NOTE — Consult Note (Signed)
Consultation Note Date: 02/11/2022   Patient Name: Dean [name].  DOB: [birth year]  MRN: [id number]  Age / Sex: 87 y.o., male  PCP: Dean Frizzle, MD Referring Physician: Murlean Iba, MD  Reason for Consultation: Establishing goals of care  HPI/Patient Profile: 87 y.o. male  with past medical history of multiple myeloma currently being treated by [name], BPH, colonic diverticular abscess in 2016, GERD, history of EtOH abuse, chemo/radiation therapy, admitted on 02/10/2022 with bilateral PE, largest in the distal portion of the left pulmonary artery, extending into the upper lobe branches, multiple lytic lesions, PE.   Clinical Assessment and Goals of Care: I have reviewed medical records including EPIC notes, labs and imaging, received report from RN, assessed the patient.  Dean [name] is lying quietly in bed.  He appears acutely/chronically ill and somewhat frail.  He is alert and oriented x 3, able to make his basic needs known.  His daughter/HCPOA, Dean [name], is present at bedside.  Echo tachycardia is also present for testing.  Echo tech completes her exams and leaves.  We then met at the bedside to discuss diagnosis prognosis, GOC, EOL wishes, disposition and options. I introduced Palliative Medicine as specialized medical care for people living with serious illness. It focuses on providing relief from the symptoms and stress of a serious illness. The goal is to improve quality of life for both the patient and the family.  We discussed a brief life review of the patient.  Dean [name] is divorced.  He has been with his current girlfriend who lives in his home for 32 years.  He and his daughter share that Dean [name] has memory loss which is worsening.  She does assist him with bathing and dressing at times.  Dean [name] expresses a lot of concern and stress about Mary's decline.  We then focused on their  current illness.  Overall, Dean [name] is able to accurately tell me what is going on with his health and the treatment plan.  At this point Dean [name] states that he will continue blood thinning medications by mouth once she is discharged.  We talked about the risks and benefits of blood thinners in people with poor mobility.  The natural disease trajectory and expectations at EOL were discussed.  I attempted to elicit values and goals of care important to the patient.   We talked about preferred place of death.  Dean [name] states that he would prefer to die in his own home.  We talk about long-term care.  Dean [name] states that he would never want to live in long-term care he would rather die.  Advanced directives, concepts specific to code status, artifical feeding and hydration, and rehospitalization were considered and discussed.  Dean [name] endorses DNR.  His daughter/HCPOA, Dean [name], states that they have held discussions and family would about his wish.  Hospice and Palliative Care services outpatient were explained and offered.  Dean [name] has been active with [facility] for outpatient palliative services.  They are agreeable to  continue the services.  We talk about transitioning to hospice care when the time is right.  We talk about what benefits are offered.  Discussed the importance of continued conversation with family and the medical providers regarding overall plan of care and treatment options, ensuring decisions are within the context of the patient's values and GOCs.  Questions and concerns were addressed.   The family was encouraged to call with questions or concerns.  PMT will continue to support holistically.  Conference with attending, bedside nursing staff, transition of care team related to patient condition, needs, goals of care, disposition.   HCPOA   HCPOA -daughter, Dean [name] and daughter Hinton Dyer.    SUMMARY OF RECOMMENDATIONS   Continue to treat the treatable but no CPR or  intubation Home with home health if qualified Continue outpatient palliative services with [facility] Continue cancer treatment as offered   Code Status/Advance Care Planning: DNR  Symptom Management:  Per hospitalist, no additional needs at this time.  Palliative Prophylaxis:  Frequent Pain Assessment, Oral Care, and Turn Reposition  Additional Recommendations (Limitations, Scope, Preferences): Continue to treat the treatable but no CPR or intubation  Psycho-social/Spiritual:  Desire for further Chaplaincy support:no Additional Recommendations: Caregiving  Support/Resources and Education on Hospice  Prognosis:  Unable to determine, based on outcomes.  3 to 6 months would not be surprising based on chronic illness burden, advancing cancer, now with PE.  Discharge Planning: Home with outpatient palliative/ACC      Primary Diagnoses: Present on Admission:  Pulmonary embolism (HCC)  Multiple myeloma without remission (HCC)  Chronic back pain  BPH (benign prostatic hyperplasia)  Acute metabolic encephalopathy   I have reviewed the medical record, interviewed the patient and family, and examined the patient. The following aspects are pertinent.  Past Medical History:  Diagnosis Date   BPH (benign prostatic hyperplasia)    Colonic diverticular abscess 01/08/2015   Diverticulitis 16/11/9602   complicated by abscess and required percutaneous drainage   GERD (gastroesophageal reflux disease)    H/O ETOH abuse    History of chemotherapy last done jan 2017   History of radiation therapy 01/05/13-02/10/13   45 gray to left occipital condyle region   History of radiation therapy 12/01/16-12/10/16   Parasternal nodule, chest- 24 Gy total delivered in 8 fractions, Left sacro-iliac, pelvis- 24 Gy total delivered in 8 fractions    History of radiation therapy 02/19/17-02/22/17   right temporal scalp 30 Gy in 10 fractions   History of radiation therapy    Right hip( Pelvis)  11/18/21-11/21/21-Dr. Gery Pray   Intra-abdominal abscess [facility])    Multiple myeloma ([place]) 2015   Neuropathy    Radiation 02/21/14-03/08/14   right posterior chest wall area 30 gray   Radiation 04/19/14-05/02/14   lumbar spine 25 gray   Skull lesion    Left occipital condyle   Wrist fracture, left    x 2   Social History   Socioeconomic History   Marital status: Significant Other    Spouse name: Downing   Number of children: 3   Years of education: Not on file   Highest education level: Not on file  Occupational History    Employer: RETIRED  Tobacco Use   Smoking status: Never   Smokeless tobacco: Never  Vaping Use   Vaping Use: Never used  Substance and Sexual Activity   Alcohol use: No    Comment: " Not much no more"   Drug use: No   Sexual activity: Not on file  Other Topics Concern   Not on file  Social History Narrative   Not on file   Social Determinants of Health   Financial Resource Strain: Low Risk  (01/09/2022)   Overall Financial Resource Strain (CARDIA)    Difficulty of Paying Living Expenses: Not very hard  Food Insecurity: No Food Insecurity (02/10/2022)   Hunger Vital Sign    Worried About Running Out of Food in the Last Year: Never true    Ran Out of Food in the Last Year: Never true  Transportation Needs: No Transportation Needs (02/10/2022)   PRAPARE - Hydrologist (Medical): No    Lack of Transportation (Non-Medical): No  Physical Activity: Inactive (01/09/2022)   Exercise Vital Sign    Days of Exercise per Week: 0 days    Minutes of Exercise per Session: 0 min  Stress: Stress Concern Present (01/09/2022)   [place]    Feeling of Stress : To some extent  Social Connections: Moderately Isolated (01/09/2022)   Social Connection and Isolation Panel [NHANES]    Frequency of Communication with Friends and Family: Three times a week    Frequency of Social  Gatherings with Friends and Family: Three times a week    Attends Religious Services: Never    Active Member of Clubs or Organizations: No    Attends Archivist Meetings: Never    Marital Status: Living with partner   Family History  Problem Relation Age of Onset   Diabetes Mother    Stroke Mother    Hypertension Father    Scheduled Meds:  acyclovir  400 mg Oral BID   Chlorhexidine Gluconate Cloth  6 each Topical Daily   dexamethasone  4 mg Oral QODAY   gabapentin  400 mg Oral BID   montelukast  10 mg Oral QHS   tamsulosin  0.4 mg Oral QPC supper   traZODone  300 mg Oral QHS   Continuous Infusions:  heparin 1,300 Units/hr (02/10/22 2219)   PRN Meds:.acetaminophen **OR** acetaminophen, oxyCODONE, perflutren lipid microspheres (DEFINITY) IV suspension, polyethylene glycol Medications Prior to Admission:  Prior to Admission medications   Medication Sig Start Date End Date Taking? Authorizing Provider  acyclovir (ZOVIRAX) 400 MG tablet TAKE 1 TABLET BY MOUTH TWICE A DAY 07/24/21  Yes Derek Jack, MD  ALPRAZolam (XANAX) 1 MG tablet TAKE 1 TABLET BY MOUTH 3 TIMES DAILY AS NEEDED FOR ANXIETY 12/04/21  Yes Lloyd Huger, MD  CALCIUM-VITAMIN D PO Take 1 tablet by mouth 2 (two) times daily.    Yes [provider]  dexamethasone (DECADRON) 4 MG tablet TAKE 1 TABLET BY MOUTH ONCE A DAY Patient taking differently: Take 4 mg by mouth See admin instructions. Take 4 mg by mouth every other day. 01/08/22  Yes Lloyd Huger, MD  fluticasone (FLONASE) 50 MCG/ACT nasal spray Place 2 sprays into both nostrils daily. 10/31/20  Yes Noemi Chapel A, NP  furosemide (LASIX) 20 MG tablet TAKE 1 TABLET BY MOUTH ONCE A DAY AS NEEDED 03/14/20  Yes Derek Jack, MD  gabapentin (NEURONTIN) 400 MG capsule TAKE 1 CAPSULE BY MOUTH 3 TIMES A DAY Patient taking differently: Take 400 mg by mouth 3 (three) times daily. Pt takes twice daily 10/03/21  Yes Finnegan, Kathlene November,  MD  Homeopathic Products ([place]) Apply topically as needed.   Yes [provider]  ibuprofen (ADVIL) 600 MG tablet Take 1 tablet (600 mg total)  by mouth every 8 (eight) hours as needed. 11/18/21  Yes Lloyd Huger, MD  lidocaine-prilocaine (EMLA) cream Apply small amount over port one (1) hour prior to appointment. 09/17/19  Yes Derek Jack, MD  montelukast (SINGULAIR) 10 MG tablet TAKE 1 TABLET BY MOUTH EVERY NIGHT AT BEDTIME 08/27/21  Yes Dean Frizzle, MD  pomalidomide (POMALYST) 3 MG capsule Take 1 capsule (3 mg total) by mouth daily. Take for 21 days, then hold for 7 days. Repeat every 28 days. 02/05/22  Yes Lloyd Huger, MD  tamsulosin (FLOMAX) 0.4 MG CAPS capsule TAKE ONE CAPSULE BY MOUTH EVERY NIGHT ATBEDTIME 12/09/21  Yes Dean Frizzle, MD  traZODone (DESYREL) 100 MG tablet Take 3 tablets (300 mg total) by mouth at bedtime. 01/08/22  Yes Lloyd Huger, MD  TURMERIC PO Take 1 tablet by mouth 2 (two) times daily.   Yes [provider]  zolpidem (AMBIEN) 10 MG tablet Take 1 tablet (10 mg total) by mouth at bedtime as needed for sleep (do not take with xanax). 01/02/22 02/10/22 Yes Dean Frizzle, MD  amoxicillin-clavulanate (AUGMENTIN) 875-125 MG tablet Take 1 tablet by mouth 2 (two) times daily. Patient not taking: Reported on 02/10/2022 01/08/22   Lloyd Huger, MD  dexamethasone (DECADRON) 4 MG tablet TAKE 1 TABLET BY MOUTH ONCE A DAY Patient not taking: Reported on 02/10/2022 11/21/21   Lloyd Huger, MD  neomycin-polymyxin-hydrocortisone (CORTISPORIN) OTIC solution Place 3 drops into the left ear 4 (four) times daily. Patient not taking: Reported on 02/10/2022 01/08/22   Lloyd Huger, MD  Oxycodone HCl 10 MG TABS TAKE 1 TO 2 TABLETS BY MOUTH 3 TIMES DAILY AS NEEDED 02/10/22   Lloyd Huger, MD  predniSONE (DELTASONE) 20 MG tablet Take 2 tablets (40 mg total) by mouth daily with breakfast. Patient not taking:  Reported on 02/10/2022 11/12/21   Dean Frizzle, MD   Allergies  Allergen Reactions   Codeine Other (See Comments)    Headache   Morphine And Related Other (See Comments)    Makes him feel weird   Revlimid [Lenalidomide] Other (See Comments)    "Causes me to become weak"   Review of Systems  Unable to perform ROS: Age    Physical Exam Vitals and nursing note reviewed.  Constitutional:      General: He is not in acute distress.    Appearance: He is ill-appearing.  HENT:     Mouth/Throat:     Mouth: Mucous membranes are dry.  Cardiovascular:     Rate and Rhythm: Normal rate.  Pulmonary:     Effort: Pulmonary effort is normal. No respiratory distress.  Skin:    General: Skin is warm and dry.     Findings: Bruising present.  Neurological:     Mental Status: He is alert and oriented to person, place, and time.  Psychiatric:        Mood and Affect: Mood normal.        Behavior: Behavior normal.     Vital Signs: BP (!) 115/46 (BP Location: Left Arm)   Pulse (!) 56   Temp 98 F (36.7 C) (Oral)   Resp 18   Wt 70.6 kg   SpO2 96%   BMI 23.67 kg/m  Pain Scale: 0-10   Pain Score: 0-No pain   SpO2: SpO2: 96 % O2 Device:SpO2: 96 % O2 Flow Rate: .   IO: Intake/output summary:  Intake/Output Summary (Last 24 hours) at 02/11/2022 1305 Last  data filed at 02/11/2022 0948 Gross per 24 hour  Intake 360 ml  Output 800 ml  Net -440 ml    LBM: Last BM Date : 02/10/22 Baseline Weight: Weight: 70.6 kg Most recent weight: Weight: 70.6 kg     Palliative Assessment/Data:     Time In: 1030 Time Out: 1145 Time Total: 75 minute  Greater than 50%  of this time was spent counseling and coordinating care related to the above assessment and plan.  Signed by: Drue Novel, NP   Please contact Palliative Medicine Team phone at [phone] for questions and concerns.  For individual provider: See Shea Evans

## 2022-02-11 NOTE — Hospital Course (Signed)
87 y.o. male with medical history significant for multiple myeloma. Patient was brought to the ED with reports of weakness, not responding to questions, feeling sleepy.   Daughter also reports increased work of breathing.  Patient reports chest tightness without chest pain.  Mild swelling to ankles bilaterally.  No vomiting no loose stools.  No history of GI bleeds.  Daughter reports history of frequent falls, over the past 3 days, patient has fallen 3 times.  He denies hitting his head.   ED Course: Temperature to 7.5.  Heart rate 46-62.  Respiratory rate 10-17.  O2 sats initially 89% on room air.  Blood pressure systolic 799-872.  Troponin 13 > 11.  BNP 383.  CTA chest- Bilateral PE, largest in the distal portion of left pulmonary artery, extending into upper lobe branches, multiple lytic lesions, T3 and T11 compression fractures-likely pathologic fractures, multiple probable old bilateral rib fractures.

## 2022-02-11 NOTE — Progress Notes (Signed)
Initial Nutrition Assessment  DOCUMENTATION CODES:   Not applicable  INTERVENTION:   -Ensure Enlive po BID, each supplement provides 350 kcal and 20 grams of protein -MVI with minerals daily -Continue regular diet -500 mg vitamin C BID -220 mg zinc sulfate daily x 14 days  NUTRITION DIAGNOSIS:   Increased nutrient needs related to cancer and cancer related treatments as evidenced by estimated needs.  GOAL:   Patient will meet greater than or equal to 90% of their needs  MONITOR:   PO intake, Supplement acceptance  REASON FOR ASSESSMENT:   Malnutrition Screening Tool    ASSESSMENT:   Pt with medical history significant for multiple myeloma.  Pt admitted with pulmonary embolism.   Reviewed I/O's: -80 ml x 24 hours  UOP: 800 ml x 24 hours  Pt unavailable at time of visit. Attempted to speak with pt via call to hospital room phone, however, unable to reach. RD unable to obtain further nutrition-related history or complete nutrition-focused physical exam at this time.    Pt with history of multiple myeloma without remission as well as multiple sites of metastasis. Per radiation oncology notes, pt is a poor candidate for further treatments. Palliative care consult reveals plan to continue to treat the treatable but no CPR or intubation. Plan for home with home health services if qualifies.   Pt currently on a regular diet. Noted meal completions 50-100%.   Reviewed wt hx; pt has experienced a 1.1% wt loss over the past 3 months, which is not significant for time frame.  Medications reviewed and include decadron.   Lab Results  Component Value Date   HGBA1C 5.6 07/06/2015   PTA DM medications are none.   Labs reviewed: CBGS: 87 (inpatient orders for glycemic control are none).    Diet Order:   Diet Order             Diet regular Room service appropriate? Yes; Fluid consistency: Thin  Diet effective now                   EDUCATION NEEDS:   No education  needs have been identified at this time  Skin:  Skin Assessment: Skin Integrity Issues: Skin Integrity Issues:: Stage II Stage II: sacrum  Last BM:  02/11/22 (type 4)  Height:   Ht Readings from Last 1 Encounters:  01/09/22 '5\' 8"'$  (1.727 m)    Weight:   Wt Readings from Last 1 Encounters:  02/10/22 70.6 kg    Ideal Body Weight:  70 kg  BMI:  Body mass index is 23.67 kg/m.  Estimated Nutritional Needs:   Kcal:  1900-2100  Protein:  90-105 grams  Fluid:  > 1.9 L    Loistine Chance, RD, LDN, [place] Registered Dietitian II Certified Diabetes Care and Education Specialist Please refer to [facility] for RD and/or RD on-call/weekend/after hours pager

## 2022-02-11 NOTE — Progress Notes (Signed)
[facility]) Hospital liaison note  Notified by Quinn Axe, NP of patient/family request for [facility] Palliative services at home after discharge.  [facility] hospital liaison will follow patient for discharge disposition.  Please call with any hospice or outpatient palliative care related questions.   Thank you for the opportunity to participate in this patient's care.  [place]  [facility] liaison  [phone]

## 2022-02-11 NOTE — Telephone Encounter (Signed)
Daughter is asking that Dr Grayland Ormond order labs he wants drawn tomorrow to be draw while patient is in [facility] with blood clots in his lungs

## 2022-02-11 NOTE — Progress Notes (Signed)
ANTICOAGULATION CONSULT NOTE -   Pharmacy Consult for IV Heparin Indication: pulmonary embolus  Allergies  Allergen Reactions   Codeine Other (See Comments)    Headache   Morphine And Related Other (See Comments)    Makes him feel weird   Revlimid [Lenalidomide] Other (See Comments)    "Causes me to become weak"    Patient Measurements: Weight: 70.6 kg (155 lb 10.3 oz)Weight - 72.5 kg (on 01/09/22 Height - 5'8" Heparin Dosing Weight: 72.5 kg  Vital Signs: Temp: 98 F (36.7 C) (01/09 0404) Temp Source: Oral (01/09 0404) BP: 115/46 (01/09 0404) Pulse Rate: 56 (01/09 0404)  Labs: Recent Labs    02/10/22 1306 02/10/22 1558 02/11/22 0328 02/11/22 0836 02/11/22 0841  HGB 9.0*  --  8.8* 9.8*  --   HCT 30.3*  --  29.0* 31.4*  --   PLT 164  --  173 171  --   HEPARINUNFRC  --   --  0.54  --  0.70  CREATININE 0.52*  --  0.50*  --   --   TROPONINIHS 13 11  --   --   --      Estimated Creatinine Clearance: 66.5 mL/min (A) (by C-G formula based on SCr of 0.5 mg/dL (L)).   Medical History: Past Medical History:  Diagnosis Date   BPH (benign prostatic hyperplasia)    Colonic diverticular abscess 01/08/2015   Diverticulitis 35/00/9381   complicated by abscess and required percutaneous drainage   GERD (gastroesophageal reflux disease)    H/O ETOH abuse    History of chemotherapy last done jan 2017   History of radiation therapy 01/05/13-02/10/13   45 gray to left occipital condyle region   History of radiation therapy 12/01/16-12/10/16   Parasternal nodule, chest- 24 Gy total delivered in 8 fractions, Left sacro-iliac, pelvis- 24 Gy total delivered in 8 fractions    History of radiation therapy 02/19/17-02/22/17   right temporal scalp 30 Gy in 10 fractions   History of radiation therapy    Right hip( Pelvis) 11/18/21-11/21/21-Dr. Gery Pray   Intra-abdominal abscess [facility])    Multiple myeloma ([place]) 2015   Neuropathy    Radiation 02/21/14-03/08/14   right posterior chest  wall area 30 gray   Radiation 04/19/14-05/02/14   lumbar spine 25 gray   Skull lesion    Left occipital condyle   Wrist fracture, left    x 2    Medications:  Facility-Administered Medications Prior to Admission  Medication Dose Route Frequency Provider Last Rate Last Admin   0.9 %  sodium chloride infusion   Intravenous Once Eulogio Bear, NP       Medications Prior to Admission  Medication Sig Dispense Refill Last Dose   acyclovir (ZOVIRAX) 400 MG tablet TAKE 1 TABLET BY MOUTH TWICE A DAY 60 tablet 6 02/09/2022   ALPRAZolam (XANAX) 1 MG tablet TAKE 1 TABLET BY MOUTH 3 TIMES DAILY AS NEEDED FOR ANXIETY 90 tablet 0 02/09/2022   CALCIUM-VITAMIN D PO Take 1 tablet by mouth 2 (two) times daily.    02/09/2022   dexamethasone (DECADRON) 4 MG tablet TAKE 1 TABLET BY MOUTH ONCE A DAY (Patient taking differently: Take 4 mg by mouth See admin instructions. Take 4 mg by mouth every other day.) 30 tablet 0 02/09/2022   fluticasone (FLONASE) 50 MCG/ACT nasal spray Place 2 sprays into both nostrils daily. 16 g 6 unknown   furosemide (LASIX) 20 MG tablet TAKE 1 TABLET BY MOUTH ONCE A DAY AS NEEDED  30 tablet 3 Past Month   gabapentin (NEURONTIN) 400 MG capsule TAKE 1 CAPSULE BY MOUTH 3 TIMES A DAY (Patient taking differently: Take 400 mg by mouth 3 (three) times daily. Pt takes twice daily) 90 capsule 4 02/09/2022   Homeopathic Products (THERAWORX RELIEF EX) Apply topically as needed.   unknown   ibuprofen (ADVIL) 600 MG tablet Take 1 tablet (600 mg total) by mouth every 8 (eight) hours as needed. 30 tablet 0 Past Month   lidocaine-prilocaine (EMLA) cream Apply small amount over port one (1) hour prior to appointment. 30 g 0 unknown   montelukast (SINGULAIR) 10 MG tablet TAKE 1 TABLET BY MOUTH EVERY NIGHT AT BEDTIME 30 tablet 3 02/09/2022   pomalidomide (POMALYST) 3 MG capsule Take 1 capsule (3 mg total) by mouth daily. Take for 21 days, then hold for 7 days. Repeat every 28 days. 21 capsule 0 02/04/2022    tamsulosin (FLOMAX) 0.4 MG CAPS capsule TAKE ONE CAPSULE BY MOUTH EVERY NIGHT ATBEDTIME 30 capsule 3 unknown   traZODone (DESYREL) 100 MG tablet Take 3 tablets (300 mg total) by mouth at bedtime. 90 tablet 1 02/09/2022   TURMERIC PO Take 1 tablet by mouth 2 (two) times daily.   02/09/2022   zolpidem (AMBIEN) 10 MG tablet Take 1 tablet (10 mg total) by mouth at bedtime as needed for sleep (do not take with xanax). 30 tablet 2 unknown   amoxicillin-clavulanate (AUGMENTIN) 875-125 MG tablet Take 1 tablet by mouth 2 (two) times daily. (Patient not taking: Reported on 02/10/2022) 14 tablet 0 Completed Course   dexamethasone (DECADRON) 4 MG tablet TAKE 1 TABLET BY MOUTH ONCE A DAY (Patient not taking: Reported on 02/10/2022) 30 tablet 0 Not Taking   neomycin-polymyxin-hydrocortisone (CORTISPORIN) OTIC solution Place 3 drops into the left ear 4 (four) times daily. (Patient not taking: Reported on 02/10/2022) 10 mL 0 Completed Course   Oxycodone HCl 10 MG TABS TAKE 1 TO 2 TABLETS BY MOUTH 3 TIMES DAILY AS NEEDED 90 tablet 0    predniSONE (DELTASONE) 20 MG tablet Take 2 tablets (40 mg total) by mouth daily with breakfast. (Patient not taking: Reported on 02/10/2022) 10 tablet 0 Not Taking   No anticoagulation prior to admission  Assessment: 87 years of age male who presented with dypsnea and weakness and low oxygen saturations found to have bilateral PE on CTA chest with largest in distal portion of left pulmonary artery, extending into upper lobe branches and multiple lytic lesions. Patient was not on anticoagulation prior to admission.   HL 0.7- therapeutic Hgb 9.8   Goal of Therapy:  Heparin level 0.3-0.7 units/ml Monitor platelets by anticoagulation protocol: Yes   Plan:  Continue heparin infusion at 1300 units/hr Check anti-Xa level daily while on heparin. Continue to monitor H&H and platelets.   Margot Ables, PharmD Clinical Pharmacist 02/11/2022 11:06 AM

## 2022-02-11 NOTE — TOC Progression Note (Signed)
Transition of Care (TOC) - Progression Note    Patient Details  Name: Dean [name]. MRN: [id number] Date of Birth: Dec 23, 1935  Transition of Care [facility]) CM/SW Contact  Salome Arnt, [place] Phone Number: 02/11/2022, 10:06 AM  Clinical Narrative:   Transition of Care [facility]) Screening Note   Patient Details  Name: Dean [name]. Date of Birth: September 13, 1935   Transition of Care [facility]) CM/SW Contact:    Salome Arnt, LCSW Phone Number: 02/11/2022, 10:06 AM    Transition of Care Department [facility]) has reviewed patient and no TOC needs have been identified at this time. We will continue to monitor patient advancement through interdisciplinary progression rounds. If new patient transition needs arise, please place a TOC consult.            Expected Discharge Plan and Services                                               Social Determinants of Health (SDOH) Interventions SDOH Screenings   Food Insecurity: No Food Insecurity (02/10/2022)  Housing: Low Risk  (02/10/2022)  Transportation Needs: No Transportation Needs (02/10/2022)  Utilities: Not At Risk (02/10/2022)  Alcohol Screen: Low Risk  (01/09/2022)  Depression (PHQ2-9): Medium Risk (01/09/2022)  Financial Resource Strain: Low Risk  (01/09/2022)  Physical Activity: Inactive (01/09/2022)  Social Connections: Moderately Isolated (01/09/2022)  Stress: Stress Concern Present (01/09/2022)  Tobacco Use: Low Risk  (02/10/2022)    Readmission Risk Interventions     No data to display

## 2022-02-11 NOTE — Telephone Encounter (Signed)
Pt daughter called and stated that pt has been admitted into [facility]. She requested that [name] give her a call.

## 2022-02-11 NOTE — Progress Notes (Signed)
ANTICOAGULATION CONSULT NOTE - Follow Up Consult  Pharmacy Consult for heparin Indication: pulmonary embolus  Labs: Recent Labs    02/10/22 1306 02/10/22 1558 02/11/22 0328  HGB 9.0*  --  8.8*  HCT 30.3*  --  29.0*  PLT 164  --  173  HEPARINUNFRC  --   --  0.54  CREATININE 0.52*  --  0.50*  TROPONINIHS 13 11  --     Assessment/Plan:  87yo male therapeutic on heparin with initial dosing for PE. Will continue infusion at current rate of 1300 units/hr and confirm stable with additional level.   Wynona Neat, PharmD, BCPS  02/11/2022,6:22 AM

## 2022-02-11 NOTE — Progress Notes (Signed)
  Echocardiogram 2D Echocardiogram has been performed.  Dean [name] 02/11/2022, 11:57 AM

## 2022-02-11 NOTE — Progress Notes (Addendum)
PROGRESS NOTE   Dean [name].  ZOX:[id number] DOB: 1935-03-22 DOA: 02/10/2022 PCP: [name], MD   Chief Complaint  Patient presents with   Weakness   Level of care: Telemetry  Brief Admission History:  87 y.o. male with medical history significant for multiple myeloma. Patient was brought to the ED with reports of weakness, not responding to questions, feeling sleepy.   Daughter also reports increased work of breathing.  Patient reports chest tightness without chest pain.  Mild swelling to ankles bilaterally.  No vomiting no loose stools.  No history of GI bleeds.  Daughter reports history of frequent falls, over the past 3 days, patient has fallen 3 times.  He denies hitting his head.   ED Course: Temperature to 7.5.  Heart rate 46-62.  Respiratory rate 10-17.  O2 sats initially 89% on room air.  Blood pressure systolic 811-914.  Troponin 13 > 11.  BNP 383.  CTA chest- Bilateral PE, largest in the distal portion of left pulmonary artery, extending into upper lobe branches, multiple lytic lesions, T3 and T11 compression fractures-likely pathologic fractures, multiple probable old bilateral rib fractures.     Assessment and Plan: * Pulmonary embolism ([place]) Presented with weakness, dyspnea.  Initial sats 87% on room air. CTA chest- Bilateral PE, largest in the distal portion of left pulmonary artery, extending into upper lobe branches, multiple lytic lesions.  Daughter reports multiple falls.  No history of GI bleed. -I have talked to patient's daughter [name] about the risk and benefits of anticoagulation, in this patient with history of multiple falls, multiple myeloma. Daughter [name]- who is HCPOA is aware, they have opted to continue with anticoagulation for now - family agreed with palliative care consult, goals of care discussion - Echocardiogram pending to evaluate for right heart strain  Acute metabolic encephalopathy Mental status appears back to baseline.  -He is in  chronic severe pain, frequently asking for his pain medication -Continue with compassionate use oxycodone 10 mg every 8 hourly   Chronic back pain Resume home oxycodone 10 mg every 8 hourly  Multiple myeloma without remission ([name]) Recurrent multiple myeloma without remission with multiple sites of osseous metastasis.  Currently on palliative treatment.  Per notes 12/7 by radiation oncology-patient with multiple falls, weakness, poor appetite, poor performance status/frailty, increasing difficulty walking, and poor candidate for further chemotherapy.  Patient wanted to continue aggressive treatment.  Plan was to follow-up with [name] for chemotherapy.  BPH (benign prostatic hyperplasia) Reports pain with urination.  -Resume tamsulosin -UA with budding yeast and leukocytes   DVT prophylaxis: IV heparin  Code Status: DNR  Family Communication: daughter at bedside 1/9 Disposition: Status is: Inpatient Remains inpatient appropriate because: intensity of illness, IV heparin required   Consultants:   Procedures:   Antimicrobials:    Subjective: Pt reports chronic back pain, no chest pain at this time.   Objective: Vitals:   02/10/22 1945 02/10/22 2035 02/11/22 0032 02/11/22 0404  BP:  (!) 110/48 (!) 117/48 (!) 115/46  Pulse: (!) 53 (!) 56 61 (!) 56  Resp: '11 16 16 18  '$ Temp: (!) 95.7 F (35.4 C) (!) 97.5 F (36.4 C) 97.7 F (36.5 C) 98 F (36.7 C)  TempSrc: Oral Oral Oral Oral  SpO2: 95% 98% 98% 96%  Weight:  70.6 kg      Intake/Output Summary (Last 24 hours) at 02/11/2022 1249 Last data filed at 02/11/2022 0948 Gross per 24 hour  Intake 360 ml  Output 800 ml  Net -440 ml   Filed Weights   02/10/22 2035  Weight: 70.6 kg   Examination:  General exam: very frail and elderly male, chronically ill appearing, Appears calm and comfortable  Respiratory system: Clear to auscultation. Respiratory effort normal. Cardiovascular system: normal S1 & S2 heard. No JVD,  murmurs, rubs, gallops or clicks. No pedal edema. Gastrointestinal system: Abdomen is nondistended, soft and nontender. No organomegaly or masses felt. Normal bowel sounds heard. Central nervous system: Alert and oriented. No focal neurological deficits. Extremities: Symmetric 5 x 5 power. Skin: No rashes, lesions or ulcers. Psychiatry: Judgement and insight appear normal. Mood & affect appropriate.   Data Reviewed: I have personally reviewed following labs and imaging studies  CBC: Recent Labs  Lab 02/10/22 1306 02/11/22 0328 02/11/22 0836  WBC 5.3 4.5 4.5  NEUTROABS 2.7  --   --   HGB 9.0* 8.8* 9.8*  HCT 30.3* 29.0* 31.4*  MCV 101.7* 99.7 100.0  PLT 164 173 644    Basic Metabolic Panel: Recent Labs  Lab 02/10/22 1306 02/11/22 0328  NA 139 140  K 3.6 4.0  CL 109 109  CO2 27 25  GLUCOSE 111* 77  BUN 27* 22  CREATININE 0.52* 0.50*  CALCIUM 7.4* 7.2*    CBG: No results for input(s): "GLUCAP" in the last 168 hours.  Recent Results (from the past 240 hour(s))  Resp panel by RT-PCR (RSV, Flu A&B, Covid) Anterior Nasal Swab     Status: None   Collection Time: 02/10/22 12:15 PM   Specimen: Anterior Nasal Swab  Result Value Ref Range Status   SARS Coronavirus 2 by RT PCR NEGATIVE NEGATIVE Final    Comment: (NOTE) SARS-CoV-2 target nucleic acids are NOT DETECTED.  The SARS-CoV-2 RNA is generally detectable in upper respiratory specimens during the acute phase of infection. The lowest concentration of SARS-CoV-2 viral copies this assay can detect is 138 copies/mL. A negative result does not preclude SARS-Cov-2 infection and should not be used as the sole basis for treatment or other patient management decisions. A negative result may occur with  improper specimen collection/handling, submission of specimen other than nasopharyngeal swab, presence of viral mutation(s) within the areas targeted by this assay, and inadequate number of viral copies(<138 copies/mL). A  negative result must be combined with clinical observations, patient history, and epidemiological information. The expected result is Negative.  Fact Sheet for Patients:  EntrepreneurPulse.com.au  Fact Sheet for Healthcare Providers:  IncredibleEmployment.be  This test is no t yet approved or cleared by the [place] FDA and  has been authorized for detection and/or diagnosis of SARS-CoV-2 by FDA under an Emergency Use Authorization (EUA). This EUA will remain  in effect (meaning this test can be used) for the duration of the COVID-19 declaration under Section 564(b)(1) of the Act, 21 U.S.C.section 360bbb-3(b)(1), unless the authorization is terminated  or revoked sooner.       Influenza A by PCR NEGATIVE NEGATIVE Final   Influenza B by PCR NEGATIVE NEGATIVE Final    Comment: (NOTE) The Xpert Xpress SARS-CoV-2/FLU/RSV plus assay is intended as an aid in the diagnosis of influenza from Nasopharyngeal swab specimens and should not be used as a sole basis for treatment. Nasal washings and aspirates are unacceptable for Xpert Xpress SARS-CoV-2/FLU/RSV testing.  Fact Sheet for Patients: EntrepreneurPulse.com.au  Fact Sheet for Healthcare Providers: IncredibleEmployment.be  This test is not yet approved or cleared by the [place] FDA and has been authorized for detection and/or diagnosis of SARS-CoV-2 by  FDA under an Emergency Use Authorization (EUA). This EUA will remain in effect (meaning this test can be used) for the duration of the COVID-19 declaration under Section 564(b)(1) of the Act, 21 U.S.C. section 360bbb-3(b)(1), unless the authorization is terminated or revoked.     Resp Syncytial Virus by PCR NEGATIVE NEGATIVE Final    Comment: (NOTE) Fact Sheet for Patients: EntrepreneurPulse.com.au  Fact Sheet for Healthcare  Providers: IncredibleEmployment.be  This test is not yet approved or cleared by the [place] FDA and has been authorized for detection and/or diagnosis of SARS-CoV-2 by FDA under an Emergency Use Authorization (EUA). This EUA will remain in effect (meaning this test can be used) for the duration of the COVID-19 declaration under Section 564(b)(1) of the Act, 21 U.S.C. section 360bbb-3(b)(1), unless the authorization is terminated or revoked.  Performed at [facility], [address], [place], [place] [address]      Radiology Studies: CT Angio Chest PE W and/or Wo Contrast  Result Date: 02/10/2022 CLINICAL DATA:  Shortness of breath.  History of multiple myeloma. EXAM: CT ANGIOGRAPHY CHEST WITH CONTRAST TECHNIQUE: Multidetector CT imaging of the chest was performed using the standard protocol during bolus administration of intravenous contrast. Multiplanar CT image reconstructions and MIPs were obtained to evaluate the vascular anatomy. RADIATION DOSE REDUCTION: This exam was performed according to the departmental dose-optimization program which includes automated exposure control, adjustment of the mA and/or kV according to patient size and/or use of iterative reconstruction technique. CONTRAST:  21m OMNIPAQUE IOHEXOL 350 MG/ML SOLN COMPARISON:  PET scan of February 17, 2017. CT scan of November 30, 2012. FINDINGS: Cardiovascular: Filling defects is seen in the distal left pulmonary artery which extends into upper lobe branches. Filling defect is also noted in upper lobe branch of the right pulmonary artery. RV/LV ratio of 0.8 is noted which is within normal limits. Normal cardiac size. No pericardial effusion. Coronary artery calcifications are noted. Atherosclerosis of thoracic aorta is noted without aneurysm or dissection. Mediastinum/Nodes: No enlarged mediastinal, hilar, or axillary lymph nodes. Thyroid gland, trachea, and esophagus demonstrate no significant findings.  Lungs/Pleura: Small bilateral pleural effusions are noted with adjacent subsegmental atelectasis. No pneumothorax is noted. Right upper lobe opacity is noted most consistent with scarring or possibly atelectasis. Upper Abdomen: No acute abnormality. Musculoskeletal: Multiple old bilateral rib fractures are noted. Lytic lesions are noted in the manubrium and sternum consistent with history of multiple myeloma. Lytic lesions are also noted in multiple thoracic vertebral bodies consistent with multiple myeloma. T3 and T11 compression fractures are noted most likely representing pathologic fractures of indeterminate age. Review of the MIP images confirms the above findings. IMPRESSION: Bilateral pulmonary emboli are noted, the largest being in distal portion of left pulmonary artery extending into upper lobe branches. Critical Value/emergent results were called by telephone at the time of interpretation on 02/10/2022 at 4:08 pm to provider NDavonna Belling who verbally acknowledged these results. Lytic lesions are noted in the manubrium, sternum and thoracic spine at multiple levels consistent with history of multiple myeloma. T3 and T11 compression fractures are noted most likely representing pathologic fractures of indeterminate age. Multiple probable old bilateral rib fractures are noted. Small bilateral pleural effusions are noted with minimal adjacent subsegmental atelectasis. Right upper lobe opacity is noted most consistent scarring or possibly atelectasis. Coronary artery calcifications are noted suggesting coronary disease. Aortic Atherosclerosis (ICD10-I70.0). Electronically Signed   By: JMarijo ConceptionM.D.   On: 02/10/2022 16:08   [facility]  Result Date: 02/10/2022 CLINICAL DATA:  Shortness of breath EXAM: PORTABLE CHEST 1 VIEW COMPARISON:  Bone scan 09/19/2021.  Old x-ray 2016 FINDINGS: Right IJ chest port in place with tip along the SVC right atrial junction. Overlapping cardiac leads. Normal  cardiopericardial silhouette. Calcified and tortuous aorta. Mild interstitial prominence. There is some bandlike opacity at the left lung base likely scar or atelectasis. Apical pleural thickening. No pneumothorax or effusion. There are some lytic lesions involving several ribs as well as several other bony structures including the right scapula, left scapula, right humerus. Please correlate with separate prior bone scan IMPRESSION: Mild basilar atelectasis on the left.  Chest port. Multiple lytic bone lesions as per patient's history Electronically Signed   By: Jill Side M.D.   On: 02/10/2022 12:31    Scheduled Meds:  acyclovir  400 mg Oral BID   Chlorhexidine Gluconate Cloth  6 each Topical Daily   dexamethasone  4 mg Oral QODAY   gabapentin  400 mg Oral BID   montelukast  10 mg Oral QHS   tamsulosin  0.4 mg Oral QPC supper   traZODone  300 mg Oral QHS   Continuous Infusions:  heparin 1,300 Units/hr (02/10/22 2219)     LOS: 1 day   Time spent: 35 mins  Deaken Jurgens Wynetta Emery, MD How to contact the [facility] Attending or Consulting provider [place] or covering provider during after hours [place], for this patient?  Check the care team in [facility] and look for a) attending/consulting TR[facility] provider listed and b) the [facility] team listed Log into [email or web address] and use [place]'s universal password to access. If you do not have the password, please contact the hospital operator. Locate the [facility] provider you are looking for under [facility] and page to a number that you can be directly reached. If you still have difficulty reaching the provider, please page the [facility] (Director on Call) for the Hospitalists listed on amion for assistance.  02/11/2022, 12:49 PM

## 2022-02-12 ENCOUNTER — Inpatient Hospital Stay: Payer: Medicare Other | Admitting: Pharmacist

## 2022-02-12 ENCOUNTER — Inpatient Hospital Stay: Payer: Medicare Other

## 2022-02-12 ENCOUNTER — Inpatient Hospital Stay: Payer: Medicare Other | Admitting: Oncology

## 2022-02-12 ENCOUNTER — Ambulatory Visit: Payer: Medicare Other | Admitting: Oncology

## 2022-02-12 DIAGNOSIS — C9000 Multiple myeloma not having achieved remission: Secondary | ICD-10-CM | POA: Diagnosis not present

## 2022-02-12 DIAGNOSIS — I2609 Other pulmonary embolism with acute cor pulmonale: Secondary | ICD-10-CM | POA: Diagnosis not present

## 2022-02-12 DIAGNOSIS — Z515 Encounter for palliative care: Secondary | ICD-10-CM | POA: Diagnosis not present

## 2022-02-12 DIAGNOSIS — I471 Supraventricular tachycardia, unspecified: Secondary | ICD-10-CM

## 2022-02-12 DIAGNOSIS — G9341 Metabolic encephalopathy: Secondary | ICD-10-CM | POA: Diagnosis not present

## 2022-02-12 DIAGNOSIS — Z7189 Other specified counseling: Secondary | ICD-10-CM | POA: Diagnosis not present

## 2022-02-12 DIAGNOSIS — I2699 Other pulmonary embolism without acute cor pulmonale: Secondary | ICD-10-CM | POA: Diagnosis not present

## 2022-02-12 LAB — FOLATE: Folate: 4.8 ng/mL — ABNORMAL LOW (ref >5.9)

## 2022-02-12 LAB — VITAMIN B12: Vitamin B-12: 164 pg/mL — ABNORMAL LOW (ref 180–914)

## 2022-02-12 LAB — CBC
HCT: 31.9 % — ABNORMAL LOW (ref 39.0–52.0)
Hemoglobin: 9.9 g/dL — ABNORMAL LOW (ref 13.0–17.0)
MCH: 30.7 pg (ref 26.0–34.0)
MCHC: 31 g/dL (ref 30.0–36.0)
MCV: 99.1 fL (ref 80.0–100.0)
Platelets: 197 10*3/uL (ref 150–400)
RBC: 3.22 MIL/uL — ABNORMAL LOW (ref 4.22–5.81)
RDW: 16.5 % — ABNORMAL HIGH (ref 11.5–15.5)
WBC: 4.7 10*3/uL (ref 4.0–10.5)
nRBC: 0 % (ref 0.0–0.2)

## 2022-02-12 LAB — TSH: TSH: 0.493 u[IU]/mL (ref 0.350–4.500)

## 2022-02-12 LAB — HEPARIN LEVEL (UNFRACTIONATED): Heparin Unfractionated: 0.63 IU/mL (ref 0.30–0.70)

## 2022-02-12 LAB — BRAIN NATRIURETIC PEPTIDE: B Natriuretic Peptide: 299 pg/mL — ABNORMAL HIGH (ref 0.0–100.0)

## 2022-02-12 MED ORDER — APIXABAN 5 MG PO TABS: 5.0000 mg | ORAL_TABLET | Freq: Two times a day (BID) | ORAL | Status: DC

## 2022-02-12 MED ORDER — HEPARIN (PORCINE) 25000 UT/250ML-% IV SOLN
INTRAVENOUS | Status: AC
  Filled 2022-02-12: qty 250

## 2022-02-12 MED ORDER — APIXABAN 5 MG PO TABS
10.0000 mg | ORAL_TABLET | Freq: Two times a day (BID) | ORAL | Status: DC
  Administered 2022-02-12 – 2022-02-13 (×3): 10 mg via ORAL
  Filled 2022-02-12 (×3): qty 2

## 2022-02-12 NOTE — Progress Notes (Signed)
Patient assist x 1 , to [facility],. After he got back in bed telemetry called states his HR went up to 160-170 for 40 seconds, notified Dr. Carles Collet , he returned to baseline once back in bed in the 90;s NSR.

## 2022-02-12 NOTE — Progress Notes (Signed)
ANTICOAGULATION CONSULT NOTE -   Pharmacy Consult for IV Heparin Indication: pulmonary embolus  Allergies  Allergen Reactions   Codeine Other (See Comments)    Headache   Morphine And Related Other (See Comments)    Makes him feel weird   Revlimid [Lenalidomide] Other (See Comments)    "Causes me to become weak"    Patient Measurements: Height: '5\' 8"'$  (172.7 cm) Weight: 70.6 kg (155 lb 10.3 oz) IBW/kg (Calculated) : 68.4 Heparin Dosing Weight: 72.5 kg  Vital Signs: Temp: 98 F (36.7 C) (01/10 0451) Temp Source: Oral (01/10 0451) BP: 120/44 (01/10 0451) Pulse Rate: 56 (01/10 0451)  Labs: Recent Labs    02/10/22 1306 02/10/22 1558 02/11/22 0328 02/11/22 0836 02/11/22 0841 02/12/22 0321  HGB 9.0*  --  8.8* 9.8*  --  9.9*  HCT 30.3*  --  29.0* 31.4*  --  31.9*  PLT 164  --  173 171  --  197  HEPARINUNFRC  --   --  0.54  --  0.70 0.63  CREATININE 0.52*  --  0.50*  --   --   --   TROPONINIHS 13 11  --   --   --   --      Estimated Creatinine Clearance: 66.5 mL/min (A) (by C-G formula based on SCr of 0.5 mg/dL (L)).   Medical History: Past Medical History:  Diagnosis Date   BPH (benign prostatic hyperplasia)    Colonic diverticular abscess 01/08/2015   Diverticulitis 16/11/9602   complicated by abscess and required percutaneous drainage   GERD (gastroesophageal reflux disease)    H/O ETOH abuse    History of chemotherapy last done jan 2017   History of radiation therapy 01/05/13-02/10/13   45 gray to left occipital condyle region   History of radiation therapy 12/01/16-12/10/16   Parasternal nodule, chest- 24 Gy total delivered in 8 fractions, Left sacro-iliac, pelvis- 24 Gy total delivered in 8 fractions    History of radiation therapy 02/19/17-02/22/17   right temporal scalp 30 Gy in 10 fractions   History of radiation therapy    Right hip( Pelvis) 11/18/21-11/21/21-Dr. Gery Pray   Intra-abdominal abscess [facility])    Multiple myeloma () 2015   Neuropathy     Radiation 02/21/14-03/08/14   right posterior chest wall area 30 gray   Radiation 04/19/14-05/02/14   lumbar spine 25 gray   Skull lesion    Left occipital condyle   Wrist fracture, left    x 2    Medications:  Facility-Administered Medications Prior to Admission  Medication Dose Route Frequency Provider Last Rate Last Admin   0.9 %  sodium chloride infusion   Intravenous Once Eulogio Bear, NP       Medications Prior to Admission  Medication Sig Dispense Refill Last Dose   acyclovir (ZOVIRAX) 400 MG tablet TAKE 1 TABLET BY MOUTH TWICE A DAY 60 tablet 6 02/09/2022   ALPRAZolam (XANAX) 1 MG tablet TAKE 1 TABLET BY MOUTH 3 TIMES DAILY AS NEEDED FOR ANXIETY 90 tablet 0 02/09/2022   CALCIUM-VITAMIN D PO Take 1 tablet by mouth 2 (two) times daily.    02/09/2022   dexamethasone (DECADRON) 4 MG tablet TAKE 1 TABLET BY MOUTH ONCE A DAY (Patient taking differently: Take 4 mg by mouth See admin instructions. Take 4 mg by mouth every other day.) 30 tablet 0 02/09/2022   fluticasone (FLONASE) 50 MCG/ACT nasal spray Place 2 sprays into both nostrils daily. 16 g 6 unknown   furosemide (LASIX)  20 MG tablet TAKE 1 TABLET BY MOUTH ONCE A DAY AS NEEDED 30 tablet 3 Past Month   gabapentin (NEURONTIN) 400 MG capsule TAKE 1 CAPSULE BY MOUTH 3 TIMES A DAY (Patient taking differently: Take 400 mg by mouth 3 (three) times daily. Pt takes twice daily) 90 capsule 4 02/09/2022   Homeopathic Products (THERAWORX RELIEF EX) Apply topically as needed.   unknown   ibuprofen (ADVIL) 600 MG tablet Take 1 tablet (600 mg total) by mouth every 8 (eight) hours as needed. 30 tablet 0 Past Month   lidocaine-prilocaine (EMLA) cream Apply small amount over port one (1) hour prior to appointment. 30 g 0 unknown   montelukast (SINGULAIR) 10 MG tablet TAKE 1 TABLET BY MOUTH EVERY NIGHT AT BEDTIME 30 tablet 3 02/09/2022   pomalidomide (POMALYST) 3 MG capsule Take 1 capsule (3 mg total) by mouth daily. Take for 21 days, then hold for 7 days.  Repeat every 28 days. 21 capsule 0 02/04/2022   tamsulosin (FLOMAX) 0.4 MG CAPS capsule TAKE ONE CAPSULE BY MOUTH EVERY NIGHT ATBEDTIME 30 capsule 3 unknown   traZODone (DESYREL) 100 MG tablet Take 3 tablets (300 mg total) by mouth at bedtime. 90 tablet 1 02/09/2022   TURMERIC PO Take 1 tablet by mouth 2 (two) times daily.   02/09/2022   zolpidem (AMBIEN) 10 MG tablet Take 1 tablet (10 mg total) by mouth at bedtime as needed for sleep (do not take with xanax). 30 tablet 2 unknown   amoxicillin-clavulanate (AUGMENTIN) 875-125 MG tablet Take 1 tablet by mouth 2 (two) times daily. (Patient not taking: Reported on 02/10/2022) 14 tablet 0 Completed Course   dexamethasone (DECADRON) 4 MG tablet TAKE 1 TABLET BY MOUTH ONCE A DAY (Patient not taking: Reported on 02/10/2022) 30 tablet 0 Not Taking   neomycin-polymyxin-hydrocortisone (CORTISPORIN) OTIC solution Place 3 drops into the left ear 4 (four) times daily. (Patient not taking: Reported on 02/10/2022) 10 mL 0 Completed Course   Oxycodone HCl 10 MG TABS TAKE 1 TO 2 TABLETS BY MOUTH 3 TIMES DAILY AS NEEDED 90 tablet 0    predniSONE (DELTASONE) 20 MG tablet Take 2 tablets (40 mg total) by mouth daily with breakfast. (Patient not taking: Reported on 02/10/2022) 10 tablet 0 Not Taking   No anticoagulation prior to admission  Assessment: 87 years of age male who presented with dypsnea and weakness and low oxygen saturations found to have bilateral PE on CTA chest with largest in distal portion of left pulmonary artery, extending into upper lobe branches and multiple lytic lesions. Patient was not on anticoagulation prior to admission. Patient with multiple myeloma  HL 0.63, therapeutic Hgb 9.8   Goal of Therapy:  Heparin level 0.3-0.7 units/ml Monitor platelets by anticoagulation protocol: Yes   Plan:  Continue heparin infusion at 1300 units/hr Check anti-Xa level daily while on heparin. Continue to monitor H&H and platelets. F/U transition to po tx  Isac Sarna, BS Pharm D, BCPS Clinical Pharmacist 02/12/2022 8:20 AM

## 2022-02-12 NOTE — Progress Notes (Signed)
Palliative: Dean [name] is resting quietly in bed.  He appears acutely/chronically ill and somewhat frail.  He is resting comfortably, but awakens easily when I call his name.  He is alert and oriented x 3, able to make his needs known.  His daughter/healthcare surrogate, Dean [name], is present at bedside.  I asked how Dean [name] is doing and he states "out of sorts".  He asks if he has to continue to stay in the hospital.  I shared that if his goal is to continue to treat, then yes, he does need another 24 hours plus of heparin infusion.  He continues to be agreeable to treat.  Dean [name] states that his significant other of 32 years, Dean [name], has gone to [place] with her son.  He shares his concerns for her.  Dean [name] had been assisting with some bathing and dressing.  Dean [name] states that he feels he may benefit from at home physical therapy if qualified.  Conference with attending, bedside nursing staff, transition of care team related to patient condition, needs, goals of care, disposition.  Plan: At this point continue to treat the treatable but no CPR or intubation.  At this point continues to be agreeable to any and all cancer treatment as offered.  Anticipate home with home health.  Will resume outpatient palliative services with [facility].  52 minutes Quinn Axe, NP Palliative medicine team Team phone [phone] Greater than 50% of this time was spent counseling and coordinating care related to the above assessment and plan.

## 2022-02-12 NOTE — Progress Notes (Signed)
PROGRESS NOTE  Dean [name]. HQI:[id number] DOB: 04-Mar-1935 DOA: 02/10/2022 PCP: [name], MD  Brief History:  87 y.o. male with medical history significant for multiple myeloma. Patient was brought to the ED with reports of weakness, not responding to questions, feeling sleepy.   Daughter also reports increased work of breathing.  Patient reports chest tightness without chest pain.  Mild swelling to ankles bilaterally.  No vomiting no loose stools.  No history of GI bleeds.  Daughter reports history of frequent falls, over the past 3 days, patient has fallen 3 times.  He denies hitting his head.   ED Course: Temperature to 7.5.  Heart rate 46-62.  Respiratory rate 10-17.  O2 sats initially 89% on room air.  Blood pressure systolic 132-440.  Troponin 13 > 11.  BNP 383.  CTA chest- Bilateral PE, largest in the distal portion of left pulmonary artery, extending into upper lobe branches, multiple lytic lesions, T3 and T11 compression fractures-likely pathologic fractures, multiple probable old bilateral rib fractures.     Assessment/Plan: * Pulmonary embolism ([place]) Presented with weakness, dyspnea.  Initial sats 87% on room air.  -CTA chest- Bilateral PE, largest in the distal portion of left pulmonary artery, extending into upper lobe branches, multiple lytic lesions.   -Daughter reports multiple falls.  No history of GI bleed. -I have talked to patient's daughter Dean [name] about the risk and benefits of anticoagulation, in this patient with history of multiple falls, multiple myeloma.  -Daughter Dean [name]- who is HCPOA is aware, they have opted to continue with anticoagulation for now - family agreed with palliative care consult, goals of care discussion - Echo EF 60-65%, G2DD, RVF appears normal   Acute metabolic encephalopathy -Mental status appears back to baseline.  -He is in chronic severe pain, frequently asking for his pain medication -Continue with compassionate use  oxycodone 10 mg every 8 hourly -he was also using alprazolam 1 mg which may also contribute    Chronic back pain Resume home oxycodone 10 mg every 8 hourly Continue gabapentin   Multiple myeloma without remission ([place]) Recurrent multiple myeloma without remission with multiple sites of osseous metastasis.   -Currently on palliative treatment.   -Per notes 12/7 by radiation oncology-patient with multiple falls, weakness, poor appetite, poor performance status/frailty, increasing difficulty walking, and poor candidate for further chemotherapy.  Patient wanted to continue aggressive treatment.   -Plan was to follow-up with [name] for chemotherapy -last IgG and M-spike increasing in 01/2022 -on daratumumab q 4 weeks   BPH (benign prostatic hyperplasia) Reports pain with urination.  -Resume tamsulosin -UA>50 WBC  Pyuria -treat empirically with cefdinir   SVT -had nonsustained SVT with ambulation 1/10 -start BB if recurrent       Family Communication:   daughter at bedside 1/10  Consultants:  palliative  Code Status:   DNR  DVT Prophylaxis:  IV Heparin>>apixaban   Procedures: As Listed in Progress Note Above  Antibiotics: None      Subjective: Patient denies fevers, chills, headache, chest pain, dyspnea, nausea, vomiting, diarrhea, abdominal pain, dysuria, hematuria, hematochezia, and melena.   Objective: Vitals:   02/11/22 1501 02/11/22 2132 02/11/22 2251 02/12/22 0451  BP: (!) 118/52 (!) 118/48  (!) 120/44  Pulse: 67 (!) 57  (!) 56  Resp: '18 16  20  '$ Temp: 98.1 F (36.7 C) 98.4 F (36.9 C)  98 F (36.7 C)  TempSrc: Oral   Oral  SpO2: 98% 97%  98%  Weight:      Height:   '5\' 8"'$  (1.727 m)     Intake/Output Summary (Last 24 hours) at 02/12/2022 1209 Last data filed at 02/12/2022 1000 Gross per 24 hour  Intake 840 ml  Output 1451 ml  Net -611 ml   Weight change:  Exam:  General:  Pt is alert, follows commands appropriately, not in acute  distress HEENT: No icterus, No thrush, No neck mass, [place]/AT Cardiovascular: RRR, S1/S2, no rubs, no gallops Respiratory: fine bibasilar rales. No wheeze Abdomen: Soft/+BS, non tender, non distended, no guarding Extremities: trace LE edema, No lymphangitis, No petechiae, No rashes, no synovitis   Data Reviewed: I have personally reviewed following labs and imaging studies Basic Metabolic Panel: Recent Labs  Lab 02/10/22 1306 02/11/22 0328  NA 139 140  K 3.6 4.0  CL 109 109  CO2 27 25  GLUCOSE 111* 77  BUN 27* 22  CREATININE 0.52* 0.50*  CALCIUM 7.4* 7.2*   Liver Function Tests: Recent Labs  Lab 02/10/22 1306  AST 13*  ALT 10  ALKPHOS 49  BILITOT 0.3  PROT 5.7*  ALBUMIN 2.0*   No results for input(s): "LIPASE", "AMYLASE" in the last 168 hours. No results for input(s): "AMMONIA" in the last 168 hours. Coagulation Profile: No results for input(s): "INR", "PROTIME" in the last 168 hours. CBC: Recent Labs  Lab 02/10/22 1306 02/11/22 0328 02/11/22 0836 02/12/22 0321  WBC 5.3 4.5 4.5 4.7  NEUTROABS 2.7  --   --   --   HGB 9.0* 8.8* 9.8* 9.9*  HCT 30.3* 29.0* 31.4* 31.9*  MCV 101.7* 99.7 100.0 99.1  PLT 164 173 171 197   Cardiac Enzymes: No results for input(s): "CKTOTAL", "CKMB", "CKMBINDEX", "TROPONINI" in the last 168 hours. BNP: Invalid input(s): "POCBNP" CBG: No results for input(s): "GLUCAP" in the last 168 hours. HbA1C: No results for input(s): "HGBA1C" in the last 72 hours. Urine analysis:    Component Value Date/Time   COLORURINE YELLOW 02/11/2022 0500   APPEARANCEUR HAZY (A) 02/11/2022 0500   LABSPEC 1.032 (H) 02/11/2022 0500   PHURINE 6.0 02/11/2022 0500   GLUCOSEU NEGATIVE 02/11/2022 0500   HGBUR NEGATIVE 02/11/2022 0500   BILIRUBINUR NEGATIVE 02/11/2022 0500   BILIRUBINUR negative 07/18/2021 0841   KETONESUR NEGATIVE 02/11/2022 0500   PROTEINUR 30 (A) 02/11/2022 0500   UROBILINOGEN 0.2 07/18/2021 0841   UROBILINOGEN 0.2 01/02/2014 1103    NITRITE NEGATIVE 02/11/2022 0500   LEUKOCYTESUR LARGE (A) 02/11/2022 0500   Sepsis Labs: '@LABRCNTIP'$ (procalcitonin:4,lacticidven:4) ) Recent Results (from the past 240 hour(s))  Resp panel by RT-PCR (RSV, Flu A&B, Covid) Anterior Nasal Swab     Status: None   Collection Time: 02/10/22 12:15 PM   Specimen: Anterior Nasal Swab  Result Value Ref Range Status   SARS Coronavirus 2 by RT PCR NEGATIVE NEGATIVE Final    Comment: (NOTE) SARS-CoV-2 target nucleic acids are NOT DETECTED.  The SARS-CoV-2 RNA is generally detectable in upper respiratory specimens during the acute phase of infection. The lowest concentration of SARS-CoV-2 viral copies this assay can detect is 138 copies/mL. A negative result does not preclude SARS-Cov-2 infection and should not be used as the sole basis for treatment or other patient management decisions. A negative result may occur with  improper specimen collection/handling, submission of specimen other than nasopharyngeal swab, presence of viral mutation(s) within the areas targeted by this assay, and inadequate number of viral copies(<138 copies/mL). A negative result must be combined  with clinical observations, patient history, and epidemiological information. The expected result is Negative.  Fact Sheet for Patients:  EntrepreneurPulse.com.au  Fact Sheet for Healthcare Providers:  IncredibleEmployment.be  This test is no t yet approved or cleared by the [place] FDA and  has been authorized for detection and/or diagnosis of SARS-CoV-2 by FDA under an Emergency Use Authorization (EUA). This EUA will remain  in effect (meaning this test can be used) for the duration of the COVID-19 declaration under Section 564(b)(1) of the Act, 21 U.S.C.section 360bbb-3(b)(1), unless the authorization is terminated  or revoked sooner.       Influenza A by PCR NEGATIVE NEGATIVE Final   Influenza B by PCR NEGATIVE NEGATIVE  Final    Comment: (NOTE) The Xpert Xpress SARS-CoV-2/FLU/RSV plus assay is intended as an aid in the diagnosis of influenza from Nasopharyngeal swab specimens and should not be used as a sole basis for treatment. Nasal washings and aspirates are unacceptable for Xpert Xpress SARS-CoV-2/FLU/RSV testing.  Fact Sheet for Patients: EntrepreneurPulse.com.au  Fact Sheet for Healthcare Providers: IncredibleEmployment.be  This test is not yet approved or cleared by the [place] FDA and has been authorized for detection and/or diagnosis of SARS-CoV-2 by FDA under an Emergency Use Authorization (EUA). This EUA will remain in effect (meaning this test can be used) for the duration of the COVID-19 declaration under Section 564(b)(1) of the Act, 21 U.S.C. section 360bbb-3(b)(1), unless the authorization is terminated or revoked.     Resp Syncytial Virus by PCR NEGATIVE NEGATIVE Final    Comment: (NOTE) Fact Sheet for Patients: EntrepreneurPulse.com.au  Fact Sheet for Healthcare Providers: IncredibleEmployment.be  This test is not yet approved or cleared by the [place] FDA and has been authorized for detection and/or diagnosis of SARS-CoV-2 by FDA under an Emergency Use Authorization (EUA). This EUA will remain in effect (meaning this test can be used) for the duration of the COVID-19 declaration under Section 564(b)(1) of the Act, 21 U.S.C. section 360bbb-3(b)(1), unless the authorization is terminated or revoked.  Performed at [facility], [address] [place], [place] [address]      Scheduled Meds:  acyclovir  400 mg Oral BID   ascorbic acid  500 mg Oral BID   Chlorhexidine Gluconate Cloth  6 each Topical Daily   dexamethasone  4 mg Oral QODAY   feeding supplement  237 mL Oral BID BM   gabapentin  400 mg Oral BID   montelukast  10 mg Oral QHS   multivitamin with minerals  1 tablet Oral Daily    tamsulosin  0.4 mg Oral QPC supper   traZODone  300 mg Oral QHS   zinc sulfate  220 mg Oral Daily   Continuous Infusions:  heparin     heparin 1,300 Units/hr (02/12/22 0829)    Procedures/Studies: ECHOCARDIOGRAM COMPLETE  Result Date: 02/11/2022    ECHOCARDIOGRAM REPORT   Patient Name:   Marcquis Ridlon. Date of Exam: 02/11/2022 Medical Rec #:  [id number]          Height:       68.0 in Accession #:    [id number]         Weight:       155.6 lb Date of Birth:  13-Apr-1935           BSA:          1.837 m Patient Age:    43 years           BP:  115/46 mmHg Patient Gender: M                  HR:           64 bpm. Exam Location:  [facility] Procedure: 2D Echo, Cardiac Doppler, Color Doppler and Intracardiac            Opacification Agent Indications:    Pulmonary Embolus I26.09  History:        Patient has no prior history of Echocardiogram examinations.                 Myeloma; Risk Factors:Non-Smoker.  Sonographer:    Greer Pickerel Referring Phys: [id number] Bethena Roys  Sonographer Comments: Technically difficult study due to poor echo windows and Technically challenging study due to limited acoustic windows. Image acquisition challenging due to patient body habitus and Image acquisition challenging due to respiratory motion. IMPRESSIONS  1. Poor acoustic windows  2. Left ventricular ejection fraction, by estimation, is 60 to 65%. The left ventricle has normal function. Left ventricular endocardial border not optimally defined to evaluate regional wall motion. Left ventricular diastolic parameters are consistent with Grade II diastolic dysfunction (pseudonormalization).  3. Right ventricular systolic function not well visualized but appears to be normal. The right ventricular size is mildly enlarged. There is mildly elevated pulmonary artery systolic pressure. The estimated right ventricular systolic pressure is 09.7 mmHg.  4. Left atrial size was severely dilated.  5. The mitral valve is abnormal.  Trivial mitral valve regurgitation. No evidence of mitral stenosis. Moderate mitral annular calcification.  6. The aortic valve is calcified. Aortic valve regurgitation is not visualized. Aortic valve sclerosis/calcification is present, without any evidence of aortic stenosis.  7. Aortic dilatation noted. There is mild dilatation of the ascending aorta, measuring 40 mm.  8. The inferior vena cava is dilated in size with >50% respiratory variability, suggesting right atrial pressure of 8 mmHg. Comparison(s): No prior Echocardiogram. FINDINGS  Left Ventricle: Left ventricular ejection fraction, by estimation, is 60 to 65%. The left ventricle has normal function. Left ventricular endocardial border not optimally defined to evaluate regional wall motion. Definity contrast agent was given IV to delineate the left ventricular endocardial borders. The left ventricular internal cavity size was normal in size. There is no left ventricular hypertrophy. Left ventricular diastolic parameters are consistent with Grade II diastolic dysfunction (pseudonormalization). Right Ventricle: The right ventricular size is mildly enlarged. No increase in right ventricular wall thickness. Right ventricular systolic function not well visualized but appears to be normal. There is mildly elevated pulmonary artery systolic pressure. The tricuspid regurgitant velocity is 2.91 m/s, and with an assumed right atrial pressure of 8 mmHg, the estimated right ventricular systolic pressure is 35.3 mmHg. Left Atrium: Left atrial size was severely dilated. Right Atrium: Right atrial size was normal in size. Pericardium: There is no evidence of pericardial effusion. Mitral Valve: The mitral valve is abnormal. Moderate mitral annular calcification. Trivial mitral valve regurgitation. No evidence of mitral valve stenosis. MV peak gradient, 4.3 mmHg. The mean mitral valve gradient is 1.0 mmHg. Tricuspid Valve: The tricuspid valve is not well visualized.  Tricuspid valve regurgitation is not demonstrated. No evidence of tricuspid stenosis. Aortic Valve: The aortic valve is calcified. Aortic valve regurgitation is not visualized. Aortic valve sclerosis/calcification is present, without any evidence of aortic stenosis. Pulmonic Valve: The pulmonic valve was not well visualized. Pulmonic valve regurgitation is not visualized. No evidence of pulmonic stenosis. Aorta: The aortic root is normal in  size and structure and aortic dilatation noted. There is mild dilatation of the ascending aorta, measuring 40 mm. Venous: The inferior vena cava is dilated in size with greater than 50% respiratory variability, suggesting right atrial pressure of 8 mmHg. IAS/Shunts: No atrial level shunt detected by color flow Doppler.  LEFT VENTRICLE PLAX 2D LVIDd:         5.80 cm   Diastology LVIDs:         3.90 cm   LV e' medial:    7.15 cm/s LV PW:         1.00 cm   LV E/e' medial:  13.2 LV IVS:        0.80 cm   LV e' lateral:   13.20 cm/s LVOT diam:     2.20 cm   LV E/e' lateral: 7.2 LV SV:         75 LV SV Index:   41 LVOT Area:     3.80 cm  RIGHT VENTRICLE RV S prime:     17.60 cm/s TAPSE (M-mode): 3.0 cm LEFT ATRIUM              Index        RIGHT ATRIUM           Index LA diam:        4.60 cm  2.50 cm/m   RA Area:     19.10 cm LA Vol (A2C):   145.0 ml 78.93 ml/m  RA Volume:   50.00 ml  27.22 ml/m LA Vol (A4C):   117.0 ml 63.69 ml/m LA Biplane Vol: 142.0 ml 77.29 ml/m  AORTIC VALVE LVOT Vmax:   107.00 cm/s LVOT Vmean:  66.700 cm/s LVOT VTI:    0.196 m  AORTA Ao Root diam: 3.60 cm Ao Asc diam:  4.00 cm MITRAL VALVE               TRICUSPID VALVE MV Area (PHT): 4.31 cm    TR Peak grad:   33.9 mmHg MV Area VTI:   2.31 cm    TR Vmax:        291.00 cm/s MV Peak grad:  4.3 mmHg MV Mean grad:  1.0 mmHg    SHUNTS MV Vmax:       1.04 m/s    Systemic VTI:  0.20 m MV Vmean:      53.8 cm/s   Systemic Diam: 2.20 cm MV Decel Time: 176 msec MR Peak grad: 66.3 mmHg MR Vmax:      407.00 cm/s MV E  velocity: 94.60 cm/s MV A velocity: 70.00 cm/s MV E/A ratio:  1.35 Vishnu Priya Mallipeddi Electronically signed by Lorelee Cover Mallipeddi Signature Date/Time: 02/11/2022/4:39:00 PM    Final    CT Angio Chest PE W and/or Wo Contrast  Result Date: 02/10/2022 CLINICAL DATA:  Shortness of breath.  History of multiple myeloma. EXAM: CT ANGIOGRAPHY CHEST WITH CONTRAST TECHNIQUE: Multidetector CT imaging of the chest was performed using the standard protocol during bolus administration of intravenous contrast. Multiplanar CT image reconstructions and MIPs were obtained to evaluate the vascular anatomy. RADIATION DOSE REDUCTION: This exam was performed according to the departmental dose-optimization program which includes automated exposure control, adjustment of the mA and/or kV according to patient size and/or use of iterative reconstruction technique. CONTRAST:  41m OMNIPAQUE IOHEXOL 350 MG/ML SOLN COMPARISON:  PET scan of February 17, 2017. CT scan of November 30, 2012. FINDINGS: Cardiovascular: Filling defects is seen in the distal left pulmonary artery  which extends into upper lobe branches. Filling defect is also noted in upper lobe branch of the right pulmonary artery. RV/LV ratio of 0.8 is noted which is within normal limits. Normal cardiac size. No pericardial effusion. Coronary artery calcifications are noted. Atherosclerosis of thoracic aorta is noted without aneurysm or dissection. Mediastinum/Nodes: No enlarged mediastinal, hilar, or axillary lymph nodes. Thyroid gland, trachea, and esophagus demonstrate no significant findings. Lungs/Pleura: Small bilateral pleural effusions are noted with adjacent subsegmental atelectasis. No pneumothorax is noted. Right upper lobe opacity is noted most consistent with scarring or possibly atelectasis. Upper Abdomen: No acute abnormality. Musculoskeletal: Multiple old bilateral rib fractures are noted. Lytic lesions are noted in the manubrium and sternum consistent with  history of multiple myeloma. Lytic lesions are also noted in multiple thoracic vertebral bodies consistent with multiple myeloma. T3 and T11 compression fractures are noted most likely representing pathologic fractures of indeterminate age. Review of the MIP images confirms the above findings. IMPRESSION: Bilateral pulmonary emboli are noted, the largest being in distal portion of left pulmonary artery extending into upper lobe branches. Critical Value/emergent results were called by telephone at the time of interpretation on 02/10/2022 at 4:08 pm to provider Davonna Belling, who verbally acknowledged these results. Lytic lesions are noted in the manubrium, sternum and thoracic spine at multiple levels consistent with history of multiple myeloma. T3 and T11 compression fractures are noted most likely representing pathologic fractures of indeterminate age. Multiple probable old bilateral rib fractures are noted. Small bilateral pleural effusions are noted with minimal adjacent subsegmental atelectasis. Right upper lobe opacity is noted most consistent scarring or possibly atelectasis. Coronary artery calcifications are noted suggesting coronary disease. Aortic Atherosclerosis (ICD10-I70.0). Electronically Signed   By: Marijo Conception M.D.   On: 02/10/2022 16:08   DG Chest Port 1 View  Result Date: 02/10/2022 CLINICAL DATA:  Shortness of breath EXAM: PORTABLE CHEST 1 VIEW COMPARISON:  Bone scan 09/19/2021.  Old x-ray 2016 FINDINGS: Right IJ chest port in place with tip along the SVC right atrial junction. Overlapping cardiac leads. Normal cardiopericardial silhouette. Calcified and tortuous aorta. Mild interstitial prominence. There is some bandlike opacity at the left lung base likely scar or atelectasis. Apical pleural thickening. No pneumothorax or effusion. There are some lytic lesions involving several ribs as well as several other bony structures including the right scapula, left scapula, right humerus. Please  correlate with separate prior bone scan IMPRESSION: Mild basilar atelectasis on the left.  Chest port. Multiple lytic bone lesions as per patient's history Electronically Signed   By: Jill Side M.D.   On: 02/10/2022 12:31    Orson Eva, DO  [facility]  If 7PM-7AM, please contact night-coverage [email or web address] Password [email or web address] 02/12/2022, 12:09 PM   LOS: 2 days

## 2022-02-12 NOTE — Progress Notes (Signed)
ANTICOAGULATION CONSULT NOTE -   Pharmacy Consult for IV Heparin=> eliquis Indication: pulmonary embolus  Allergies  Allergen Reactions   Codeine Other (See Comments)    Headache   Morphine And Related Other (See Comments)    Makes him feel weird   Revlimid [Lenalidomide] Other (See Comments)    "Causes me to become weak"    Patient Measurements: Height: '5\' 8"'$  (172.7 cm) Weight: 70.6 kg (155 lb 10.3 oz) IBW/kg (Calculated) : 68.4 Heparin Dosing Weight: 72.5 kg  Vital Signs: Temp: 98 F (36.7 C) (01/10 0451) Temp Source: Oral (01/10 0451) BP: 120/44 (01/10 0451) Pulse Rate: 56 (01/10 0451)  Labs: Recent Labs    02/10/22 1306 02/10/22 1558 02/11/22 0328 02/11/22 0836 02/11/22 0841 02/12/22 0321  HGB 9.0*  --  8.8* 9.8*  --  9.9*  HCT 30.3*  --  29.0* 31.4*  --  31.9*  PLT 164  --  173 171  --  197  HEPARINUNFRC  --   --  0.54  --  0.70 0.63  CREATININE 0.52*  --  0.50*  --   --   --   TROPONINIHS 13 11  --   --   --   --      Estimated Creatinine Clearance: 66.5 mL/min (A) (by C-G formula based on SCr of 0.5 mg/dL (L)).   Medical History: Past Medical History:  Diagnosis Date   BPH (benign prostatic hyperplasia)    Colonic diverticular abscess 01/08/2015   Diverticulitis 34/19/6222   complicated by abscess and required percutaneous drainage   GERD (gastroesophageal reflux disease)    H/O ETOH abuse    History of chemotherapy last done jan 2017   History of radiation therapy 01/05/13-02/10/13   45 gray to left occipital condyle region   History of radiation therapy 12/01/16-12/10/16   Parasternal nodule, chest- 24 Gy total delivered in 8 fractions, Left sacro-iliac, pelvis- 24 Gy total delivered in 8 fractions    History of radiation therapy 02/19/17-02/22/17   right temporal scalp 30 Gy in 10 fractions   History of radiation therapy    Right hip( Pelvis) 11/18/21-11/21/21-Dr. Gery Pray   Intra-abdominal abscess [facility])    Multiple myeloma ([place]) 2015    Neuropathy    Radiation 02/21/14-03/08/14   right posterior chest wall area 30 gray   Radiation 04/19/14-05/02/14   lumbar spine 25 gray   Skull lesion    Left occipital condyle   Wrist fracture, left    x 2    Medications:  Facility-Administered Medications Prior to Admission  Medication Dose Route Frequency Provider Last Rate Last Admin   0.9 %  sodium chloride infusion   Intravenous Once Eulogio Bear, NP       Medications Prior to Admission  Medication Sig Dispense Refill Last Dose   acyclovir (ZOVIRAX) 400 MG tablet TAKE 1 TABLET BY MOUTH TWICE A DAY 60 tablet 6 02/09/2022   ALPRAZolam (XANAX) 1 MG tablet TAKE 1 TABLET BY MOUTH 3 TIMES DAILY AS NEEDED FOR ANXIETY 90 tablet 0 02/09/2022   CALCIUM-VITAMIN D PO Take 1 tablet by mouth 2 (two) times daily.    02/09/2022   dexamethasone (DECADRON) 4 MG tablet TAKE 1 TABLET BY MOUTH ONCE A DAY (Patient taking differently: Take 4 mg by mouth See admin instructions. Take 4 mg by mouth every other day.) 30 tablet 0 02/09/2022   fluticasone (FLONASE) 50 MCG/ACT nasal spray Place 2 sprays into both nostrils daily. 16 g 6 unknown   furosemide (  LASIX) 20 MG tablet TAKE 1 TABLET BY MOUTH ONCE A DAY AS NEEDED 30 tablet 3 Past Month   gabapentin (NEURONTIN) 400 MG capsule TAKE 1 CAPSULE BY MOUTH 3 TIMES A DAY (Patient taking differently: Take 400 mg by mouth 3 (three) times daily. Pt takes twice daily) 90 capsule 4 02/09/2022   Homeopathic Products (THERAWORX RELIEF EX) Apply topically as needed.   unknown   ibuprofen (ADVIL) 600 MG tablet Take 1 tablet (600 mg total) by mouth every 8 (eight) hours as needed. 30 tablet 0 Past Month   lidocaine-prilocaine (EMLA) cream Apply small amount over port one (1) hour prior to appointment. 30 g 0 unknown   montelukast (SINGULAIR) 10 MG tablet TAKE 1 TABLET BY MOUTH EVERY NIGHT AT BEDTIME 30 tablet 3 02/09/2022   pomalidomide (POMALYST) 3 MG capsule Take 1 capsule (3 mg total) by mouth daily. Take for 21 days, then hold  for 7 days. Repeat every 28 days. 21 capsule 0 02/04/2022   tamsulosin (FLOMAX) 0.4 MG CAPS capsule TAKE ONE CAPSULE BY MOUTH EVERY NIGHT ATBEDTIME 30 capsule 3 unknown   traZODone (DESYREL) 100 MG tablet Take 3 tablets (300 mg total) by mouth at bedtime. 90 tablet 1 02/09/2022   TURMERIC PO Take 1 tablet by mouth 2 (two) times daily.   02/09/2022   zolpidem (AMBIEN) 10 MG tablet Take 1 tablet (10 mg total) by mouth at bedtime as needed for sleep (do not take with xanax). 30 tablet 2 unknown   amoxicillin-clavulanate (AUGMENTIN) 875-125 MG tablet Take 1 tablet by mouth 2 (two) times daily. (Patient not taking: Reported on 02/10/2022) 14 tablet 0 Completed Course   dexamethasone (DECADRON) 4 MG tablet TAKE 1 TABLET BY MOUTH ONCE A DAY (Patient not taking: Reported on 02/10/2022) 30 tablet 0 Not Taking   neomycin-polymyxin-hydrocortisone (CORTISPORIN) OTIC solution Place 3 drops into the left ear 4 (four) times daily. (Patient not taking: Reported on 02/10/2022) 10 mL 0 Completed Course   Oxycodone HCl 10 MG TABS TAKE 1 TO 2 TABLETS BY MOUTH 3 TIMES DAILY AS NEEDED 90 tablet 0    predniSONE (DELTASONE) 20 MG tablet Take 2 tablets (40 mg total) by mouth daily with breakfast. (Patient not taking: Reported on 02/10/2022) 10 tablet 0 Not Taking   No anticoagulation prior to admission  Assessment: 87 years of age male who presented with dypsnea and weakness and low oxygen saturations found to have bilateral PE on CTA chest with largest in distal portion of left pulmonary artery, extending into upper lobe branches and multiple lytic lesions. Patient was not on anticoagulation prior to admission. Patient with multiple myeloma  HL 0.63, therapeutic. => transition to eliquis tx dose Hgb 9.8   Goal of Therapy:  Heparin level 0.3-0.7 units/ml Monitor platelets by anticoagulation protocol: Yes   Plan:  D/c heparin Eliquis '10mg'$  po bid x 7 days, then '5mg'$  po bid Educate on eliquis Continue to monitor H&H and  platelets.   Isac Sarna, BS Pharm D, BCPS Clinical Pharmacist 02/12/2022 12:17 PM

## 2022-02-13 DIAGNOSIS — F1120 Opioid dependence, uncomplicated: Secondary | ICD-10-CM | POA: Diagnosis not present

## 2022-02-13 DIAGNOSIS — C9000 Multiple myeloma not having achieved remission: Secondary | ICD-10-CM | POA: Diagnosis not present

## 2022-02-13 DIAGNOSIS — I2699 Other pulmonary embolism without acute cor pulmonale: Secondary | ICD-10-CM | POA: Diagnosis not present

## 2022-02-13 DIAGNOSIS — G9341 Metabolic encephalopathy: Secondary | ICD-10-CM | POA: Diagnosis not present

## 2022-02-13 LAB — BASIC METABOLIC PANEL
Anion gap: 4 — ABNORMAL LOW (ref 5–15)
BUN: 17 mg/dL (ref 8–23)
CO2: 26 mmol/L (ref 22–32)
Calcium: 7.4 mg/dL — ABNORMAL LOW (ref 8.9–10.3)
Chloride: 108 mmol/L (ref 98–111)
Creatinine, Ser: 0.63 mg/dL (ref 0.61–1.24)
GFR, Estimated: >60 mL/min (ref >60)
Glucose, Bld: 89 mg/dL (ref 70–99)
Potassium: 3.8 mmol/L (ref 3.5–5.1)
Sodium: 138 mmol/L (ref 135–145)

## 2022-02-13 LAB — CBC
HCT: 32.8 % — ABNORMAL LOW (ref 39.0–52.0)
Hemoglobin: 10.2 g/dL — ABNORMAL LOW (ref 13.0–17.0)
MCH: 30.4 pg (ref 26.0–34.0)
MCHC: 31.1 g/dL (ref 30.0–36.0)
MCV: 97.6 fL (ref 80.0–100.0)
Platelets: 196 10*3/uL (ref 150–400)
RBC: 3.36 MIL/uL — ABNORMAL LOW (ref 4.22–5.81)
RDW: 16.8 % — ABNORMAL HIGH (ref 11.5–15.5)
WBC: 4.8 10*3/uL (ref 4.0–10.5)
nRBC: 0 % (ref 0.0–0.2)

## 2022-02-13 LAB — MAGNESIUM: Magnesium: 2.1 mg/dL (ref 1.7–2.4)

## 2022-02-13 LAB — T4, FREE: Free T4: 0.97 ng/dL (ref 0.61–1.12)

## 2022-02-13 MED ORDER — METOPROLOL TARTRATE 25 MG PO TABS: 12.5000 mg | ORAL_TABLET | Freq: Two times a day (BID) | ORAL | 1 refills | Status: DC

## 2022-02-13 MED ORDER — FOLIC ACID 1 MG PO TABS
1.0000 mg | ORAL_TABLET | Freq: Every day | ORAL | Status: DC
  Administered 2022-02-13: 1 mg via ORAL
  Filled 2022-02-13: qty 1

## 2022-02-13 MED ORDER — CEFDINIR 300 MG PO CAPS: 300.0000 mg | ORAL_CAPSULE | Freq: Two times a day (BID) | ORAL | 0 refills | Status: DC

## 2022-02-13 MED ORDER — HEPARIN SOD (PORK) LOCK FLUSH 100 UNIT/ML IV SOLN
500.0000 [IU] | Freq: Once | INTRAVENOUS | Status: AC
  Administered 2022-02-13: 500 [IU] via INTRAVENOUS

## 2022-02-13 MED ORDER — HEPARIN SOD (PORK) LOCK FLUSH 100 UNIT/ML IV SOLN
INTRAVENOUS | Status: AC
  Filled 2022-02-13: qty 5

## 2022-02-13 MED ORDER — VITAMIN B-12 1000 MCG PO TABS
500.0000 ug | ORAL_TABLET | Freq: Every day | ORAL | Status: DC
  Administered 2022-02-13: 500 ug via ORAL
  Filled 2022-02-13: qty 1

## 2022-02-13 MED ORDER — FOLIC ACID 1 MG PO TABS: 1.0000 mg | ORAL_TABLET | Freq: Every day | ORAL | Status: DC

## 2022-02-13 MED ORDER — CYANOCOBALAMIN 500 MCG PO TABS: 500.0000 ug | ORAL_TABLET | Freq: Every day | ORAL | Status: DC

## 2022-02-13 MED ORDER — METOPROLOL TARTRATE 25 MG PO TABS
12.5000 mg | ORAL_TABLET | Freq: Two times a day (BID) | ORAL | Status: DC
  Administered 2022-02-13: 12.5 mg via ORAL
  Filled 2022-02-13: qty 1

## 2022-02-13 MED ORDER — CEFDINIR 300 MG PO CAPS
300.0000 mg | ORAL_CAPSULE | Freq: Two times a day (BID) | ORAL | Status: DC
  Administered 2022-02-13: 300 mg via ORAL
  Filled 2022-02-13: qty 1

## 2022-02-13 MED ORDER — APIXABAN 5 MG PO TABS: 10.0000 mg | ORAL_TABLET | Freq: Two times a day (BID) | ORAL | 1 refills | Status: DC

## 2022-02-13 NOTE — TOC Transition Note (Addendum)
Transition of Care [facility]) - CM/SW Discharge Note   Patient Details  Name: Dean [name]. MRN: [id number] Date of Birth: 1935-12-21  Transition of [facility]) CM/SW Contact:  Ihor Gully, LCSW Phone Number: 02/13/2022, 11:48 AM   Clinical Narrative:    Update: Per Erasmo Downer with [employer], daughter declined equipment due to not wanting to put her credit card on file.   Daughter request 3n1 and wc. No DME preference. Ordered via [employer]. HH ordered via [employer]. [place] set up with [facility].   Final next level of care: [place] Barriers to Discharge: No Barriers Identified   Patient Goals and CMS Choice      Discharge Placement                         Discharge Plan and Services Additional resources added to the After Visit Summary for                  DME Arranged: Wheelchair manual, 3-N-1 DME Agency: [employer] Date DME Agency Contacted: 02/13/22 Time DME Agency Contacted: [id number] Representative spoke with at DME Agency: [place]: PT [place]: [place] ([place]) Date [place]: 02/13/22 Time [place]: [id number] Representative spoke with at [place]: [place] Determinants of Health ([place]) Interventions [place]: No Food Insecurity (02/10/2022)  Housing: Low Risk  (02/10/2022)  Transportation Needs: No Transportation Needs (02/10/2022)  Utilities: Not At Risk (02/10/2022)  Alcohol Screen: Low Risk  (01/09/2022)  Depression (PHQ2-9): Medium Risk (01/09/2022)  Financial Resource Strain: Low Risk  (01/09/2022)  Physical Activity: Inactive (01/09/2022)  Social Connections: Moderately Isolated (01/09/2022)  Stress: Stress Concern Present (01/09/2022)  Tobacco Use: Low Risk  (02/11/2022)     Readmission Risk Interventions     No data to display

## 2022-02-13 NOTE — Care Management Important Message (Signed)
Important Message  Patient Details  Name: Dean [name]. MRN: [id number] Date of Birth: Aug 23, 1935   Medicare Important Message Given:  N/A - LOS <3 / Initial given by admissions     [name] 02/13/2022, 8:13 AM

## 2022-02-13 NOTE — Evaluation (Signed)
Physical Therapy Evaluation Patient Details Name: Dean [name]. MRN: [id number] DOB: [birth year] Today's Date: 02/13/2022  History of Present Illness  Dean [name]. is a 87 y.o. male with medical history significant for multiple myeloma.  Patient was brought to the ED with reports of weakness, not responding to questions, feeling sleepy.   Of my evaluation patient is awake alert and oriented and able to answer questions appropriately.  Daughter- [name], is at bedside. Daughter also reports increased work of breathing today.  Patient reports chest tightness without chest pain.  Mild swelling to ankles bilaterally.  No vomiting no loose stools.  No history of GI bleeds.  Daughter reports history of frequent falls, over the past 3 days, patient has fallen 3 times.  He denies hitting his head.   Clinical Impression  Patient functioning near baseline for functional mobility and gait demonstrating good return for sitting up at bedside, and transferring to chair, requires verbal cues for proper hand placement using RW during ambulation, no loss of balance and limited mostly due to fatigue.  Patient tolerated sitting up in chair after therapy with his daughter present in room.  PLAN:  Patient to be discharged home today and discharged from acute physical therapy to care of nursing for ambulation as tolerated for length of stay with recommendations stated below          Recommendations for follow up therapy are one component of a multi-disciplinary discharge planning process, led by the attending physician.  Recommendations may be updated based on patient status, additional functional criteria and insurance authorization.  Follow Up Recommendations Home health PT      Assistance Recommended at Discharge Set up Supervision/Assistance  Patient can return home with the following  A little help with walking and/or transfers;A little help with bathing/dressing/bathroom;Help with stairs or ramp for  entrance;Assistance with Forensic psychologist (measurements PT);Wheelchair cushion (measurements PT)  Recommendations for Other Services       Functional Status Assessment Patient has had a recent decline in their functional status and/or demonstrates limited ability to make significant improvements in function in a reasonable and predictable amount of time     Precautions / Restrictions Precautions Precautions: Fall Restrictions Weight Bearing Restrictions: No      Mobility  Bed Mobility Overal bed mobility: Modified Independent                  Transfers Overall transfer level: Modified independent Equipment used: Rolling walker (2 wheels)               General transfer comment: increased time, slightly labored movement    Ambulation/Gait Ambulation/Gait assistance: Supervision, Min guard Gait Distance (Feet): 25 Feet Assistive device: Rolling walker (2 wheels) Gait Pattern/deviations: Decreased step length - left, Decreased stance time - right, Decreased stride length, Trunk flexed Gait velocity: decreased     General Gait Details: slow labored cadence without loss of balance, limited mostly due to fatigue  Stairs            Wheelchair Mobility    Modified Rankin (Stroke Patients Only)       Balance Overall balance assessment: Needs assistance Sitting-balance support: Feet supported, No upper extremity supported Sitting balance-Leahy Scale: Good Sitting balance - Comments: seated at EOB   Standing balance support: During functional activity, Bilateral upper extremity supported Standing balance-Leahy Scale: Fair Standing balance comment: fair/good using RW  Pertinent Vitals/Pain Pain Assessment Pain Assessment: No/denies pain    Home Living Family/patient expects to be discharged to:: Private residence Living Arrangements: Spouse/significant other Available  Help at Discharge: Family;Available PRN/intermittently Type of Home: Mobile home Home Access: Stairs to enter   Entrance Stairs-Number of Steps: 1   Home Layout: One level Home Equipment: Conservation officer, nature (2 wheels);Shower seat      Prior Function Prior Level of Function : Needs assist       Physical Assist : Mobility (physical);ADLs (physical) Mobility (physical): Bed mobility;Transfers;Gait;Stairs   Mobility Comments: Supervised household ambulator using RW ADLs Comments: assisted by family     Hand Dominance        Extremity/Trunk Assessment   Upper Extremity Assessment Upper Extremity Assessment: Overall WFL for tasks assessed    Lower Extremity Assessment Lower Extremity Assessment: Generalized weakness    Cervical / Trunk Assessment Cervical / Trunk Assessment: Kyphotic  Communication   Communication: No difficulties  Cognition Arousal/Alertness: Awake/alert Behavior During Therapy: WFL for tasks assessed/performed Overall Cognitive Status: Within Functional Limits for tasks assessed                                          General Comments      Exercises     Assessment/Plan    PT Assessment All further PT needs can be met in the next venue of care  PT Problem List Decreased strength;Decreased activity tolerance;Decreased balance;Decreased mobility       PT Treatment Interventions      PT Goals (Current goals can be found in the Care Plan section)  Acute Rehab PT Goals Patient Stated Goal: return home with family to assist PT Goal Formulation: With patient/family Time For Goal Achievement: 02/13/22 Potential to Achieve Goals: Good    Frequency       Co-evaluation               AM-PAC PT "6 Clicks" Mobility  Outcome Measure Help needed turning from your back to your side while in a flat bed without using bedrails?: None Help needed moving from lying on your back to sitting on the side of a flat bed without using  bedrails?: None Help needed moving to and from a bed to a chair (including a wheelchair)?: A Little Help needed standing up from a chair using your arms (e.g., wheelchair or bedside chair)?: A Little Help needed to walk in hospital room?: A Little Help needed climbing 3-5 steps with a railing? : A Lot 6 Click Score: 19    End of Session   Activity Tolerance: Patient tolerated treatment well;Patient limited by fatigue Patient left: in chair;with call bell/phone within reach;with family/visitor present Nurse Communication: Mobility status PT Visit Diagnosis: Unsteadiness on feet (R26.81);Other abnormalities of gait and mobility (R26.89);Muscle weakness (generalized) (M62.81)    Time: 1638-4665 PT Time Calculation (min) (ACUTE ONLY): 16 min   Charges:   PT Evaluation $PT Eval Low Complexity: 1 Low PT Treatments $Therapeutic Activity: 8-22 mins        10:51 AM, 02/13/22 Lonell Grandchild, MPT Physical Therapist with [facility] 336 [phone] office [phone] mobile phone

## 2022-02-13 NOTE — Progress Notes (Cosign Needed)
Patient has mobility limitations that impair their ability to do one or more mobility related activities of daily living in customary locations in the home.  Patient cannot use crutches, cane or walker to resolve the issue sufficiently, patient can safely self-propel the wheelchair in the home or has a caregiver that can assist.     The beneficiary is confined to a single room and in need of a 3n1.

## 2022-02-13 NOTE — Discharge Summary (Addendum)
Physician Discharge Summary   Patient: Dean [name]. MRN: [id number] DOB: 09/13/1935  Admit date:     02/10/2022  Discharge date: 02/13/22  Discharge Physician: Shanon Brow Chantilly Linskey   PCP: [name], MD   Recommendations at discharge:   Please follow up with primary care provider within 1-2 weeks  Please repeat BMP and CBC in one week     Hospital Course: 87 y.o. male with medical history significant for multiple myeloma. Patient was brought to the ED with reports of weakness, not responding to questions, feeling sleepy.   Daughter also reports increased work of breathing.  Patient reports chest tightness without chest pain.  Mild swelling to ankles bilaterally.  No vomiting no loose stools.  No history of GI bleeds.  Daughter reports history of frequent falls, over the past 3 days, patient has fallen 3 times.  He denies hitting his head.   ED Course: Temperature to 7.5.  Heart rate 46-62.  Respiratory rate 10-17.  O2 sats initially 89% on room air.  Blood pressure systolic 865-784.  Troponin 13 > 11.  BNP 383.  CTA chest- Bilateral PE, largest in the distal portion of left pulmonary artery, extending into upper lobe branches, multiple lytic lesions, T3 and T11 compression fractures-likely pathologic fractures, multiple probable old bilateral rib fractures.    Assessment and Plan: Pulmonary embolism ([place]) Presented with weakness, dyspnea.  Initial sats 87% on room air.  -CTA chest- Bilateral PE, largest in the distal portion of left pulmonary artery, extending into upper lobe branches, multiple lytic lesions.   -Daughter reports multiple falls.  No history of GI bleed. -discussed with patient's daughter [name] about the risk and benefits of anticoagulation, in this patient with history of multiple falls, multiple myeloma.  -Daughter [name]- who is HCPOA is aware, they have opted to continue with anticoagulation for now - family agreed with palliative care consult, goals of care  discussion - Echo EF 60-65%, G2DD, RVF appears normal -on IV heparin during hospitalization -transitioned to apixaban at time d/c -stable on RA, remains hemodynamically stable   Acute metabolic encephalopathy -Mental status back to baseline at time of dc -He is in chronic severe pain, frequently asking for his pain medication -Continue with compassionate use oxycodone 10 mg every 8 hourly -he was also using alprazolam 1 mg which may also contribute  -B12--164>>replete po -folate 4.8>>replete po -TSH 0.493 -UA > 50 WBC   Chronic back pain Resume home oxycodone 10 mg every 8 hourly Continue gabapentin   Multiple myeloma without remission ([place]) Recurrent multiple myeloma without remission with multiple sites of osseous metastasis.   -Currently on palliative treatment.   -Per notes 12/7 by radiation oncology-patient with multiple falls, weakness, poor appetite, poor performance status/frailty, increasing difficulty walking, and poor candidate for further chemotherapy.  Patient wanted to continue aggressive treatment.   -Plan was to follow-up with [name] for chemotherapy -last IgG and M-spike increasing in 01/2022 -on daratumumab q 4 weeks   BPH (benign prostatic hyperplasia) Reports pain with urination.  -Resume tamsulosin -UA>50 WBC   Pyuria -treat empirically with cefdinir x 5 days -urine culture was not sent   SVT -had nonsustained SVT with ambulation 1/10 -Thereafter, he had intermittent short non-sustained runs, all <5 seconds -start low dose metoprolol  Opioid dependence -PDMP reviewed--pt receives oxycodone 10 mg, #90 monthly -receives alprazolam '1mg'$  #90 monthly -zolpidem 10 mg, #30 monthly         Consultants: palliative Procedures performed: none  Disposition: Home Diet  recommendation:  Regular diet DISCHARGE MEDICATION: Allergies as of 02/13/2022       Reactions   Codeine Other (See Comments)   Headache   Morphine And Related Other (See  Comments)   Makes him feel weird   Revlimid [lenalidomide] Other (See Comments)   "Causes me to become weak"        Medication List     STOP taking these medications    amoxicillin-clavulanate 875-125 MG tablet Commonly known as: AUGMENTIN   ibuprofen 600 MG tablet Commonly known as: ADVIL   neomycin-polymyxin-hydrocortisone OTIC solution Commonly known as: CORTISPORIN   predniSONE 20 MG tablet Commonly known as: DELTASONE       TAKE these medications    acyclovir 400 MG tablet Commonly known as: ZOVIRAX TAKE 1 TABLET BY MOUTH TWICE A DAY   ALPRAZolam 1 MG tablet Commonly known as: XANAX TAKE 1 TABLET BY MOUTH 3 TIMES DAILY AS NEEDED FOR ANXIETY   apixaban 5 MG Tabs tablet Commonly known as: ELIQUIS Take 2 tablets (10 mg total) by mouth 2 (two) times daily. Then 1 tab (5 mg) two times daily starting 02/19/22   CALCIUM-VITAMIN D PO Take 1 tablet by mouth 2 (two) times daily.   cefdinir 300 MG capsule Commonly known as: OMNICEF Take 1 capsule (300 mg total) by mouth every 12 (twelve) hours.   cyanocobalamin 500 MCG tablet Commonly known as: VITAMIN B12 Take 1 tablet (500 mcg total) by mouth daily.   dexamethasone 4 MG tablet Commonly known as: DECADRON TAKE 1 TABLET BY MOUTH ONCE A DAY What changed:  when to take this additional instructions Another medication with the same name was removed. Continue taking this medication, and follow the directions you see here.   fluticasone 50 MCG/ACT nasal spray Commonly known as: FLONASE Place 2 sprays into both nostrils daily.   folic acid 1 MG tablet Commonly known as: FOLVITE Take 1 tablet (1 mg total) by mouth daily.   furosemide 20 MG tablet Commonly known as: LASIX TAKE 1 TABLET BY MOUTH ONCE A DAY AS NEEDED   gabapentin 400 MG capsule Commonly known as: NEURONTIN TAKE 1 CAPSULE BY MOUTH 3 TIMES A DAY What changed: additional instructions   lidocaine-prilocaine cream Commonly known as:  EMLA Apply small amount over port one (1) hour prior to appointment.   metoprolol tartrate 25 MG tablet Commonly known as: LOPRESSOR Take 0.5 tablets (12.5 mg total) by mouth 2 (two) times daily.   montelukast 10 MG tablet Commonly known as: SINGULAIR TAKE 1 TABLET BY MOUTH EVERY NIGHT AT BEDTIME   Oxycodone HCl 10 MG Tabs TAKE 1 TO 2 TABLETS BY MOUTH 3 TIMES DAILY AS NEEDED What changed: See the new instructions.   pomalidomide 3 MG capsule Commonly known as: POMALYST Take 1 capsule (3 mg total) by mouth daily. Take for 21 days, then hold for 7 days. Repeat every 28 days.   tamsulosin 0.4 MG Caps capsule Commonly known as: FLOMAX TAKE ONE CAPSULE BY MOUTH EVERY NIGHT ATBEDTIME   THERAWORX RELIEF EX Apply topically as needed.   traZODone 100 MG tablet Commonly known as: DESYREL Take 3 tablets (300 mg total) by mouth at bedtime.   TURMERIC PO Take 1 tablet by mouth 2 (two) times daily.   zolpidem 10 MG tablet Commonly known as: AMBIEN Take 1 tablet (10 mg total) by mouth at bedtime as needed for sleep (do not take with xanax).        Discharge Exam: [employer]  02/10/22 2035  Weight: 70.6 kg   HEENT:  /AT, No thrush, no icterus CV:  RRR, no rub, no S3, no S4 Lung:  bibasilar crackles.  No wheeze Abd:  soft/+BS, NT Ext:  No edema, no lymphangitis, no synovitis, no rash   Condition at discharge: stable  The results of significant diagnostics from this hospitalization (including imaging, microbiology, ancillary and laboratory) are listed below for reference.   Imaging Studies: ECHOCARDIOGRAM COMPLETE  Result Date: 02/11/2022    ECHOCARDIOGRAM REPORT   Patient Name:   Dean [name]. Date of Exam: 02/11/2022 Medical Rec #:  [id number]          Height:       68.0 in Accession #:    [id number]         Weight:       155.6 lb Date of Birth:  [birth year]           BSA:          1.837 m Patient Age:    44 years           BP:           115/46 mmHg Patient Gender: M                   HR:           64 bpm. Exam Location:  [facility] Procedure: 2D Echo, Cardiac Doppler, Color Doppler and Intracardiac            Opacification Agent Indications:    Pulmonary Embolus I26.09  History:        Patient has no prior history of Echocardiogram examinations.                 Myeloma; Risk Factors:Non-Smoker.  Sonographer:    Greer Pickerel Referring Phys: [id number] Bethena Roys  Sonographer Comments: Technically difficult study due to poor echo windows and Technically challenging study due to limited acoustic windows. Image acquisition challenging due to patient body habitus and Image acquisition challenging due to respiratory motion. IMPRESSIONS  1. Poor acoustic windows  2. Left ventricular ejection fraction, by estimation, is 60 to 65%. The left ventricle has normal function. Left ventricular endocardial border not optimally defined to evaluate regional wall motion. Left ventricular diastolic parameters are consistent with Grade II diastolic dysfunction (pseudonormalization).  3. Right ventricular systolic function not well visualized but appears to be normal. The right ventricular size is mildly enlarged. There is mildly elevated pulmonary artery systolic pressure. The estimated right ventricular systolic pressure is 35.3 mmHg.  4. Left atrial size was severely dilated.  5. The mitral valve is abnormal. Trivial mitral valve regurgitation. No evidence of mitral stenosis. Moderate mitral annular calcification.  6. The aortic valve is calcified. Aortic valve regurgitation is not visualized. Aortic valve sclerosis/calcification is present, without any evidence of aortic stenosis.  7. Aortic dilatation noted. There is mild dilatation of the ascending aorta, measuring 40 mm.  8. The inferior vena cava is dilated in size with >50% respiratory variability, suggesting right atrial pressure of 8 mmHg. Comparison(s): No prior Echocardiogram. FINDINGS  Left Ventricle: Left ventricular ejection  fraction, by estimation, is 60 to 65%. The left ventricle has normal function. Left ventricular endocardial border not optimally defined to evaluate regional wall motion. Definity contrast agent was given IV to delineate the left ventricular endocardial borders. The left ventricular internal cavity size was normal in size. There is no left ventricular hypertrophy. Left ventricular diastolic  parameters are consistent with Grade II diastolic dysfunction (pseudonormalization). Right Ventricle: The right ventricular size is mildly enlarged. No increase in right ventricular wall thickness. Right ventricular systolic function not well visualized but appears to be normal. There is mildly elevated pulmonary artery systolic pressure. The tricuspid regurgitant velocity is 2.91 m/s, and with an assumed right atrial pressure of 8 mmHg, the estimated right ventricular systolic pressure is 71.0 mmHg. Left Atrium: Left atrial size was severely dilated. Right Atrium: Right atrial size was normal in size. Pericardium: There is no evidence of pericardial effusion. Mitral Valve: The mitral valve is abnormal. Moderate mitral annular calcification. Trivial mitral valve regurgitation. No evidence of mitral valve stenosis. MV peak gradient, 4.3 mmHg. The mean mitral valve gradient is 1.0 mmHg. Tricuspid Valve: The tricuspid valve is not well visualized. Tricuspid valve regurgitation is not demonstrated. No evidence of tricuspid stenosis. Aortic Valve: The aortic valve is calcified. Aortic valve regurgitation is not visualized. Aortic valve sclerosis/calcification is present, without any evidence of aortic stenosis. Pulmonic Valve: The pulmonic valve was not well visualized. Pulmonic valve regurgitation is not visualized. No evidence of pulmonic stenosis. Aorta: The aortic root is normal in size and structure and aortic dilatation noted. There is mild dilatation of the ascending aorta, measuring 40 mm. Venous: The inferior vena cava is  dilated in size with greater than 50% respiratory variability, suggesting right atrial pressure of 8 mmHg. IAS/Shunts: No atrial level shunt detected by color flow Doppler.  LEFT VENTRICLE PLAX 2D LVIDd:         5.80 cm   Diastology LVIDs:         3.90 cm   LV e' medial:    7.15 cm/s LV PW:         1.00 cm   LV E/e' medial:  13.2 LV IVS:        0.80 cm   LV e' lateral:   13.20 cm/s LVOT diam:     2.20 cm   LV E/e' lateral: 7.2 LV SV:         75 LV SV Index:   41 LVOT Area:     3.80 cm  RIGHT VENTRICLE RV S prime:     17.60 cm/s TAPSE (M-mode): 3.0 cm LEFT ATRIUM              Index        RIGHT ATRIUM           Index LA diam:        4.60 cm  2.50 cm/m   RA Area:     19.10 cm LA Vol (A2C):   145.0 ml 78.93 ml/m  RA Volume:   50.00 ml  27.22 ml/m LA Vol (A4C):   117.0 ml 63.69 ml/m LA Biplane Vol: 142.0 ml 77.29 ml/m  AORTIC VALVE LVOT Vmax:   107.00 cm/s LVOT Vmean:  66.700 cm/s LVOT VTI:    0.196 m  AORTA Ao Root diam: 3.60 cm Ao Asc diam:  4.00 cm MITRAL VALVE               TRICUSPID VALVE MV Area (PHT): 4.31 cm    TR Peak grad:   33.9 mmHg MV Area VTI:   2.31 cm    TR Vmax:        291.00 cm/s MV Peak grad:  4.3 mmHg MV Mean grad:  1.0 mmHg    SHUNTS MV Vmax:       1.04 m/s    Systemic VTI:  0.20 m MV Vmean:      53.8 cm/s   Systemic Diam: 2.20 cm MV Decel Time: 176 msec MR Peak grad: 66.3 mmHg MR Vmax:      407.00 cm/s MV E velocity: 94.60 cm/s MV A velocity: 70.00 cm/s MV E/A ratio:  1.35 Vishnu Priya Mallipeddi Electronically signed by Lorelee Cover Mallipeddi Signature Date/Time: 02/11/2022/4:39:00 PM    Final    CT Angio Chest PE W and/or Wo Contrast  Result Date: 02/10/2022 CLINICAL DATA:  Shortness of breath.  History of multiple myeloma. EXAM: CT ANGIOGRAPHY CHEST WITH CONTRAST TECHNIQUE: Multidetector CT imaging of the chest was performed using the standard protocol during bolus administration of intravenous contrast. Multiplanar CT image reconstructions and MIPs were obtained to evaluate the  vascular anatomy. RADIATION DOSE REDUCTION: This exam was performed according to the departmental dose-optimization program which includes automated exposure control, adjustment of the mA and/or kV according to patient size and/or use of iterative reconstruction technique. CONTRAST:  59m OMNIPAQUE IOHEXOL 350 MG/ML SOLN COMPARISON:  PET scan of February 17, 2017. CT scan of November 30, 2012. FINDINGS: Cardiovascular: Filling defects is seen in the distal left pulmonary artery which extends into upper lobe branches. Filling defect is also noted in upper lobe branch of the right pulmonary artery. RV/LV ratio of 0.8 is noted which is within normal limits. Normal cardiac size. No pericardial effusion. Coronary artery calcifications are noted. Atherosclerosis of thoracic aorta is noted without aneurysm or dissection. Mediastinum/Nodes: No enlarged mediastinal, hilar, or axillary lymph nodes. Thyroid gland, trachea, and esophagus demonstrate no significant findings. Lungs/Pleura: Small bilateral pleural effusions are noted with adjacent subsegmental atelectasis. No pneumothorax is noted. Right upper lobe opacity is noted most consistent with scarring or possibly atelectasis. Upper Abdomen: No acute abnormality. Musculoskeletal: Multiple old bilateral rib fractures are noted. Lytic lesions are noted in the manubrium and sternum consistent with history of multiple myeloma. Lytic lesions are also noted in multiple thoracic vertebral bodies consistent with multiple myeloma. T3 and T11 compression fractures are noted most likely representing pathologic fractures of indeterminate age. Review of the MIP images confirms the above findings. IMPRESSION: Bilateral pulmonary emboli are noted, the largest being in distal portion of left pulmonary artery extending into upper lobe branches. Critical Value/emergent results were called by telephone at the time of interpretation on 02/10/2022 at 4:08 pm to provider NDavonna Belling who  verbally acknowledged these results. Lytic lesions are noted in the manubrium, sternum and thoracic spine at multiple levels consistent with history of multiple myeloma. T3 and T11 compression fractures are noted most likely representing pathologic fractures of indeterminate age. Multiple probable old bilateral rib fractures are noted. Small bilateral pleural effusions are noted with minimal adjacent subsegmental atelectasis. Right upper lobe opacity is noted most consistent scarring or possibly atelectasis. Coronary artery calcifications are noted suggesting coronary disease. Aortic Atherosclerosis (ICD10-I70.0). Electronically Signed   By: JMarijo ConceptionM.D.   On: 02/10/2022 16:08   DG Chest Port 1 View  Result Date: 02/10/2022 CLINICAL DATA:  Shortness of breath EXAM: PORTABLE CHEST 1 VIEW COMPARISON:  Bone scan 09/19/2021.  Old x-ray 2016 FINDINGS: Right IJ chest port in place with tip along the SVC right atrial junction. Overlapping cardiac leads. Normal cardiopericardial silhouette. Calcified and tortuous aorta. Mild interstitial prominence. There is some bandlike opacity at the left lung base likely scar or atelectasis. Apical pleural thickening. No pneumothorax or effusion. There are some lytic lesions involving several ribs as well as several other bony structures  including the right scapula, left scapula, right humerus. Please correlate with separate prior bone scan IMPRESSION: Mild basilar atelectasis on the left.  Chest port. Multiple lytic bone lesions as per patient's history Electronically Signed   By: Jill Side M.D.   On: 02/10/2022 12:31    Microbiology: Results for orders placed or performed during the hospital encounter of 02/10/22  Resp panel by RT-PCR (RSV, Flu A&B, Covid) Anterior Nasal Swab     Status: None   Collection Time: 02/10/22 12:15 PM   Specimen: Anterior Nasal Swab  Result Value Ref Range Status   SARS Coronavirus 2 by RT PCR NEGATIVE NEGATIVE Final    Comment:  (NOTE) SARS-CoV-2 target nucleic acids are NOT DETECTED.  The SARS-CoV-2 RNA is generally detectable in upper respiratory specimens during the acute phase of infection. The lowest concentration of SARS-CoV-2 viral copies this assay can detect is 138 copies/mL. A negative result does not preclude SARS-Cov-2 infection and should not be used as the sole basis for treatment or other patient management decisions. A negative result may occur with  improper specimen collection/handling, submission of specimen other than nasopharyngeal swab, presence of viral mutation(s) within the areas targeted by this assay, and inadequate number of viral copies(<138 copies/mL). A negative result must be combined with clinical observations, patient history, and epidemiological information. The expected result is Negative.  Fact Sheet for Patients:  EntrepreneurPulse.com.au  Fact Sheet for Healthcare Providers:  IncredibleEmployment.be  This test is no t yet approved or cleared by the [place] FDA and  has been authorized for detection and/or diagnosis of SARS-CoV-2 by FDA under an Emergency Use Authorization (EUA). This EUA will remain  in effect (meaning this test can be used) for the duration of the COVID-19 declaration under Section 564(b)(1) of the Act, 21 U.S.C.section 360bbb-3(b)(1), unless the authorization is terminated  or revoked sooner.       Influenza A by PCR NEGATIVE NEGATIVE Final   Influenza B by PCR NEGATIVE NEGATIVE Final    Comment: (NOTE) The Xpert Xpress SARS-CoV-2/FLU/RSV plus assay is intended as an aid in the diagnosis of influenza from Nasopharyngeal swab specimens and should not be used as a sole basis for treatment. Nasal washings and aspirates are unacceptable for Xpert Xpress SARS-CoV-2/FLU/RSV testing.  Fact Sheet for Patients: EntrepreneurPulse.com.au  Fact Sheet for Healthcare  Providers: IncredibleEmployment.be  This test is not yet approved or cleared by the [place] FDA and has been authorized for detection and/or diagnosis of SARS-CoV-2 by FDA under an Emergency Use Authorization (EUA). This EUA will remain in effect (meaning this test can be used) for the duration of the COVID-19 declaration under Section 564(b)(1) of the Act, 21 U.S.C. section 360bbb-3(b)(1), unless the authorization is terminated or revoked.     Resp Syncytial Virus by PCR NEGATIVE NEGATIVE Final    Comment: (NOTE) Fact Sheet for Patients: EntrepreneurPulse.com.au  Fact Sheet for Healthcare Providers: IncredibleEmployment.be  This test is not yet approved or cleared by the [place] FDA and has been authorized for detection and/or diagnosis of SARS-CoV-2 by FDA under an Emergency Use Authorization (EUA). This EUA will remain in effect (meaning this test can be used) for the duration of the COVID-19 declaration under Section 564(b)(1) of the Act, 21 U.S.C. section 360bbb-3(b)(1), unless the authorization is terminated or revoked.  Performed at [facility], [address] [place], [place] [address]    *Note: Due to a large number of results and/or encounters for the requested time period, some results  have not been displayed. A complete set of results can be found in Results Review.    Labs: CBC: Recent Labs  Lab 02/10/22 1306 02/11/22 0328 02/11/22 0836 02/12/22 0321 02/13/22 0500  WBC 5.3 4.5 4.5 4.7 4.8  NEUTROABS 2.7  --   --   --   --   HGB 9.0* 8.8* 9.8* 9.9* 10.2*  HCT 30.3* 29.0* 31.4* 31.9* 32.8*  MCV 101.7* 99.7 100.0 99.1 97.6  PLT 164 173 171 197 354   Basic Metabolic Panel: Recent Labs  Lab 02/10/22 1306 02/11/22 0328 02/13/22 0500  NA 139 140 138  K 3.6 4.0 3.8  CL 109 109 108  CO2 '27 25 26  '$ GLUCOSE 111* 77 89  BUN 27* 22 17  CREATININE 0.52* 0.50* 0.63  CALCIUM 7.4* 7.2* 7.4*   MG  --   --  2.1   Liver Function Tests: Recent Labs  Lab 02/10/22 1306  AST 13*  ALT 10  ALKPHOS 49  BILITOT 0.3  PROT 5.7*  ALBUMIN 2.0*   CBG: No results for input(s): "GLUCAP" in the last 168 hours.  Discharge time spent: greater than 30 minutes.  Signed: Orson Eva, MD [facility] 02/13/2022

## 2022-02-14 ENCOUNTER — Telehealth: Payer: Self-pay

## 2022-02-14 NOTE — Patient Outreach (Signed)
  Care Coordination TOC Note Transition Care Management Follow-up Telephone Call Date of discharge and from where: [facility] How have you been since you were released from the hospital? Per patients daughter, he is doing better but still very weak. Any questions or concerns? No  Items Reviewed: Did the pt receive and understand the discharge instructions provided? Yes  Medications obtained and verified? Yes  Other? No  Any new allergies since your discharge? No  Dietary orders reviewed? Yes Do you have support at home? Yes   Home Care and Equipment/Supplies: Were home health services ordered? yes If so, what is the name of the agency? [facility]  Has the agency set up a time to come to the patient's home? yes Were any new equipment or medical supplies ordered?  Yes: wheelchair What is the name of the medical supply agency? [employer] Were you able to get the supplies/equipment? Daughter found somewhere to get w/c for free as [employer] wanted a credit card. Do you have any questions related to the use of the equipment or supplies? No  Functional Questionnaire: (I = Independent and D = Dependent) ADLs:  D  Bathing/Dressing- D  Meal Prep- D  Eating- D  Maintaining continence- I  Transferring/Ambulation- NEEDS ASSISTANCE  Managing Meds- D  Follow up appointments reviewed:  PCP Hospital f/u appt confirmed? No  Daughter wants patient to see oncologist, not PCP [facility] f/u appt confirmed? Yes  Scheduled to see [name] on 02/18/22 @ 10:00. Are transportation arrangements needed? No  If their condition worsens, is the pt aware to call PCP or go to the Emergency Dept.? Yes Was the patient provided with contact information for the PCP's office or ED? Yes Was to pt encouraged to call back with questions or concerns? Yes  SDOH assessments and interventions completed:   Yes SDOH Interventions Today    Flowsheet Row Most Recent Value  SDOH Interventions   Food  Insecurity Interventions Intervention Not Indicated  Housing Interventions Intervention Not Indicated       Care Coordination Interventions:  No Care Coordination interventions needed at this time.   Encounter Outcome:  Pt. Visit Completed

## 2022-02-18 ENCOUNTER — Inpatient Hospital Stay: Payer: Medicare Other | Admitting: Pharmacist

## 2022-02-18 ENCOUNTER — Inpatient Hospital Stay: Payer: Medicare Other

## 2022-02-18 ENCOUNTER — Inpatient Hospital Stay: Payer: Medicare Other | Admitting: Oncology

## 2022-02-19 ENCOUNTER — Telehealth: Payer: Medicare Other | Admitting: Hospice and Palliative Medicine

## 2022-02-20 ENCOUNTER — Inpatient Hospital Stay: Payer: Medicare Other | Attending: Oncology

## 2022-02-20 ENCOUNTER — Encounter: Payer: Self-pay | Admitting: Oncology

## 2022-02-20 ENCOUNTER — Inpatient Hospital Stay (HOSPITAL_BASED_OUTPATIENT_CLINIC_OR_DEPARTMENT_OTHER): Payer: Medicare Other | Admitting: Oncology

## 2022-02-20 ENCOUNTER — Inpatient Hospital Stay: Payer: Medicare Other | Admitting: Pharmacist

## 2022-02-20 VITALS — BP 122/68 | HR 50 | Temp 97.4°F | Resp 16 | Ht 68.0 in | Wt 147.0 lb

## 2022-02-20 DIAGNOSIS — Z79899 Other long term (current) drug therapy: Secondary | ICD-10-CM | POA: Diagnosis not present

## 2022-02-20 DIAGNOSIS — M25551 Pain in right hip: Secondary | ICD-10-CM | POA: Insufficient documentation

## 2022-02-20 DIAGNOSIS — D649 Anemia, unspecified: Secondary | ICD-10-CM | POA: Diagnosis not present

## 2022-02-20 DIAGNOSIS — C9000 Multiple myeloma not having achieved remission: Secondary | ICD-10-CM | POA: Diagnosis not present

## 2022-02-20 DIAGNOSIS — G629 Polyneuropathy, unspecified: Secondary | ICD-10-CM | POA: Insufficient documentation

## 2022-02-20 DIAGNOSIS — M549 Dorsalgia, unspecified: Secondary | ICD-10-CM | POA: Diagnosis not present

## 2022-02-20 DIAGNOSIS — M25552 Pain in left hip: Secondary | ICD-10-CM | POA: Diagnosis not present

## 2022-02-20 DIAGNOSIS — G4700 Insomnia, unspecified: Secondary | ICD-10-CM | POA: Insufficient documentation

## 2022-02-20 DIAGNOSIS — I2699 Other pulmonary embolism without acute cor pulmonale: Secondary | ICD-10-CM | POA: Diagnosis not present

## 2022-02-20 DIAGNOSIS — Z7901 Long term (current) use of anticoagulants: Secondary | ICD-10-CM | POA: Diagnosis not present

## 2022-02-20 DIAGNOSIS — R296 Repeated falls: Secondary | ICD-10-CM | POA: Diagnosis not present

## 2022-02-20 LAB — COMPREHENSIVE METABOLIC PANEL
ALT: 12 U/L (ref 0–44)
AST: 17 U/L (ref 15–41)
Albumin: 2.5 g/dL — ABNORMAL LOW (ref 3.5–5.0)
Alkaline Phosphatase: 64 U/L (ref 38–126)
Anion gap: 4 — ABNORMAL LOW (ref 5–15)
BUN: 27 mg/dL — ABNORMAL HIGH (ref 8–23)
CO2: 26 mmol/L (ref 22–32)
Calcium: 7.8 mg/dL — ABNORMAL LOW (ref 8.9–10.3)
Chloride: 103 mmol/L (ref 98–111)
Creatinine, Ser: 0.67 mg/dL (ref 0.61–1.24)
GFR, Estimated: >60 mL/min (ref >60)
Glucose, Bld: 112 mg/dL — ABNORMAL HIGH (ref 70–99)
Potassium: 3.9 mmol/L (ref 3.5–5.1)
Sodium: 133 mmol/L — ABNORMAL LOW (ref 135–145)
Total Bilirubin: 0.2 mg/dL — ABNORMAL LOW (ref 0.3–1.2)
Total Protein: 7.9 g/dL (ref 6.5–8.1)

## 2022-02-20 LAB — CBC WITH DIFFERENTIAL/PLATELET
Abs Immature Granulocytes: 0.05 10*3/uL (ref 0.00–0.07)
Basophils Absolute: 0.1 10*3/uL (ref 0.0–0.1)
Basophils Relative: 1 %
Eosinophils Absolute: 0 10*3/uL (ref 0.0–0.5)
Eosinophils Relative: 0 %
HCT: 35.9 % — ABNORMAL LOW (ref 39.0–52.0)
Hemoglobin: 11.4 g/dL — ABNORMAL LOW (ref 13.0–17.0)
Immature Granulocytes: 1 %
Lymphocytes Relative: 27 %
Lymphs Abs: 1.8 10*3/uL (ref 0.7–4.0)
MCH: 30.3 pg (ref 26.0–34.0)
MCHC: 31.8 g/dL (ref 30.0–36.0)
MCV: 95.5 fL (ref 80.0–100.0)
Monocytes Absolute: 0.5 10*3/uL (ref 0.1–1.0)
Monocytes Relative: 7 %
Neutro Abs: 4.3 10*3/uL (ref 1.7–7.7)
Neutrophils Relative %: 64 %
Platelets: 242 10*3/uL (ref 150–400)
RBC: 3.76 MIL/uL — ABNORMAL LOW (ref 4.22–5.81)
RDW: 16.7 % — ABNORMAL HIGH (ref 11.5–15.5)
WBC: 6.6 10*3/uL (ref 4.0–10.5)
nRBC: 0 % (ref 0.0–0.2)

## 2022-02-20 LAB — MAGNESIUM: Magnesium: 2.3 mg/dL (ref 1.7–2.4)

## 2022-02-20 NOTE — Progress Notes (Signed)
[place]  Telephone:([phone] [phone]  ID: Dean [name]. OB: 1935/04/04  MR#: [id number]  WNU#:[id number]  Patient Care Team: [name], MD as PCP - General (Family Medicine) Gery Pray, MD as Consulting Physician (Radiation Oncology) Danie Binder, MD (Inactive) as Consulting Physician (Gastroenterology) Michael Boston, MD as Consulting Physician (General Surgery) Lloyd Huger, MD as Consulting Physician (Oncology)  CHIEF COMPLAINT: Multiple myeloma  INTERVAL HISTORY: Patient returns to clinic today for further evaluation, hospital follow-up, and reinitiation of Pomalyst.  He was recently admitted to [facility] with large pulmonary embolism and is now on Eliquis.  He continues to have weakness, fatigue, and declining performance status.  He continues to have frequent falls.  He denies any fevers. He continues to have bilateral hip pain.  He has no neurologic complaints.  He denies any fevers.  He has a poor appetite.  He has no chest pain, shortness of breath, cough, or hemoptysis.  He denies any nausea, vomiting, constipation, or diarrhea.  He has no urinary complaints.  Patient offers no further complaints today.  REVIEW OF SYSTEMS:   Review of Systems  Constitutional:  Positive for malaise/fatigue. Negative for fever and weight loss.  Respiratory: Negative.  Negative for cough, hemoptysis and shortness of breath.   Cardiovascular: Negative.  Negative for chest pain and leg swelling.  Gastrointestinal: Negative.  Negative for abdominal pain and nausea.  Genitourinary:  Positive for flank pain. Negative for dysuria.  Musculoskeletal:  Positive for back pain, falls and joint pain.  Skin: Negative.  Negative for rash.  Neurological:  Positive for sensory change and weakness. Negative for dizziness, focal weakness and headaches.  Psychiatric/Behavioral: Negative.  The patient is not nervous/anxious and does not have  insomnia.     As per HPI. Otherwise, a complete review of systems is negative.  PAST MEDICAL HISTORY: Past Medical History:  Diagnosis Date   BPH (benign prostatic hyperplasia)    Colonic diverticular abscess 01/08/2015   Diverticulitis 03/47/4259   complicated by abscess and required percutaneous drainage   GERD (gastroesophageal reflux disease)    H/O ETOH abuse    History of chemotherapy last done jan 2017   History of radiation therapy 01/05/13-02/10/13   45 gray to left occipital condyle region   History of radiation therapy 12/01/16-12/10/16   Parasternal nodule, chest- 24 Gy total delivered in 8 fractions, Left sacro-iliac, pelvis- 24 Gy total delivered in 8 fractions    History of radiation therapy 02/19/17-02/22/17   right temporal scalp 30 Gy in 10 fractions   History of radiation therapy    Right hip( Pelvis) 11/18/21-11/21/21-Dr. Gery Pray   Intra-abdominal abscess ([place])    Multiple myeloma ([place]) 2015   Neuropathy    Radiation 02/21/14-03/08/14   right posterior chest wall area 30 gray   Radiation 04/19/14-05/02/14   lumbar spine 25 gray   Skull lesion    Left occipital condyle   Wrist fracture, left    x 2    PAST SURGICAL HISTORY: Past Surgical History:  Procedure Laterality Date   COLONOSCOPY N/A 04/19/2015   Procedure: COLONOSCOPY;  Surgeon: Danie Binder, MD;  Location: [facility];  Service: Endoscopy;  Laterality: N/A;  1:30 PM   CYST EXCISION  1959   tail bone   IR IMAGING GUIDED PORT INSERTION  09/25/2017    FAMILY HISTORY: Family History  Problem Relation Age of Onset   Diabetes Mother    Stroke Mother  Hypertension Father     ADVANCED DIRECTIVES (Y/N):  N  HEALTH MAINTENANCE: Social History   Tobacco Use   Smoking status: Never   Smokeless tobacco: Never  Vaping Use   Vaping Use: Never used  Substance Use Topics   Alcohol use: No    Comment: " Not much no more"   Drug use: No     Colonoscopy:  PAP:  Bone density:  Lipid  panel:  Allergies  Allergen Reactions   Codeine Other (See Comments)    Headache   Morphine And Related Other (See Comments)    Makes him feel weird   Revlimid [Lenalidomide] Other (See Comments)    "Causes me to become weak"    Current Outpatient Medications  Medication Sig Dispense Refill   acyclovir (ZOVIRAX) 400 MG tablet TAKE 1 TABLET BY MOUTH TWICE A DAY 60 tablet 6   ALPRAZolam (XANAX) 1 MG tablet TAKE 1 TABLET BY MOUTH 3 TIMES DAILY AS NEEDED FOR ANXIETY 90 tablet 0   apixaban (ELIQUIS) 5 MG TABS tablet Take 2 tablets (10 mg total) by mouth 2 (two) times daily. Then 1 tab (5 mg) two times daily starting 02/19/22 71 tablet 1   CALCIUM-VITAMIN D PO Take 1 tablet by mouth 2 (two) times daily.      cyanocobalamin (VITAMIN B12) 500 MCG tablet Take 1 tablet (500 mcg total) by mouth daily.     dexamethasone (DECADRON) 4 MG tablet TAKE 1 TABLET BY MOUTH ONCE A DAY (Patient taking differently: Take 4 mg by mouth See admin instructions. Take 4 mg by mouth every other day.) 30 tablet 0   gabapentin (NEURONTIN) 400 MG capsule TAKE 1 CAPSULE BY MOUTH 3 TIMES A DAY (Patient taking differently: Take 400 mg by mouth 3 (three) times daily. Pt takes twice daily) 90 capsule 4   Homeopathic Products (THERAWORX RELIEF EX) Apply topically as needed.     lidocaine-prilocaine (EMLA) cream Apply small amount over port one (1) hour prior to appointment. 30 g 0   montelukast (SINGULAIR) 10 MG tablet TAKE 1 TABLET BY MOUTH EVERY NIGHT AT BEDTIME 30 tablet 3   Oxycodone HCl 10 MG TABS TAKE 1 TO 2 TABLETS BY MOUTH 3 TIMES DAILY AS NEEDED 90 tablet 0   traZODone (DESYREL) 100 MG tablet Take 3 tablets (300 mg total) by mouth at bedtime. 90 tablet 1   TURMERIC PO Take 1 tablet by mouth 2 (two) times daily.     cefdinir (OMNICEF) 300 MG capsule Take 1 capsule (300 mg total) by mouth every 12 (twelve) hours. (Patient not taking: Reported on 02/20/2022) 10 capsule 0   fluticasone (FLONASE) 50 MCG/ACT nasal spray  Place 2 sprays into both nostrils daily. (Patient not taking: Reported on 04/03/6008) 16 g 6   folic acid (FOLVITE) 1 MG tablet Take 1 tablet (1 mg total) by mouth daily. (Patient not taking: Reported on 02/20/2022)     furosemide (LASIX) 20 MG tablet TAKE 1 TABLET BY MOUTH ONCE A DAY AS NEEDED (Patient not taking: Reported on 02/20/2022) 30 tablet 3   metoprolol tartrate (LOPRESSOR) 25 MG tablet Take 0.5 tablets (12.5 mg total) by mouth 2 (two) times daily. (Patient not taking: Reported on 02/20/2022) 60 tablet 1   pomalidomide (POMALYST) 3 MG capsule Take 1 capsule (3 mg total) by mouth daily. Take for 21 days, then hold for 7 days. Repeat every 28 days. (Patient not taking: Reported on 02/20/2022) 21 capsule 0   tamsulosin (FLOMAX) 0.4 MG CAPS capsule  TAKE ONE CAPSULE BY MOUTH EVERY NIGHT ATBEDTIME (Patient not taking: Reported on 02/20/2022) 30 capsule 3   zolpidem (AMBIEN) 10 MG tablet Take 1 tablet (10 mg total) by mouth at bedtime as needed for sleep (do not take with xanax). (Patient not taking: Reported on 02/20/2022) 30 tablet 2   No current facility-administered medications for this visit.   Facility-Administered Medications Ordered in Other Visits  Medication Dose Route Frequency Provider Last Rate Last Admin   heparin lock flush 100 unit/mL  500 Units Intravenous Once Lloyd Huger, MD        OBJECTIVE: Vitals:   02/20/22 0900  BP: 122/68  Pulse: (!) 50  Resp: 16  Temp: (!) 97.4 F (36.3 C)  SpO2: 99%      Body mass index is 22.35 kg/m.    ECOG FS:2 - Symptomatic, <50% confined to bed  General: Frail, no acute distress.  Sitting in a wheelchair. Eyes: Pink conjunctiva, anicteric sclera. HEENT: Normocephalic, moist mucous membranes. Lungs: No audible wheezing or coughing. Heart: Regular rate and rhythm. Abdomen: Soft, nontender, no obvious distention. Musculoskeletal: No edema, cyanosis, or clubbing. Neuro: Alert, answering all questions appropriately. Cranial nerves  grossly intact. Skin: No rashes or petechiae noted. Psych: Normal affect.  LAB RESULTS:  Lab Results  Component Value Date   NA 133 (L) 02/20/2022   K 3.9 02/20/2022   CL 103 02/20/2022   CO2 26 02/20/2022   GLUCOSE 112 (H) 02/20/2022   BUN 27 (H) 02/20/2022   CREATININE 0.67 02/20/2022   CALCIUM 7.8 (L) 02/20/2022   PROT 7.9 02/20/2022   ALBUMIN 2.5 (L) 02/20/2022   AST 17 02/20/2022   ALT 12 02/20/2022   ALKPHOS 64 02/20/2022   BILITOT 0.2 (L) 02/20/2022   GFRNONAA >60 02/20/2022   GFRAA >60 10/13/2019    Lab Results  Component Value Date   WBC 6.6 02/20/2022   NEUTROABS 4.3 02/20/2022   HGB 11.4 (L) 02/20/2022   HCT 35.9 (L) 02/20/2022   MCV 95.5 02/20/2022   PLT 242 02/20/2022     STUDIES: ECHOCARDIOGRAM COMPLETE  Result Date: 02/11/2022    ECHOCARDIOGRAM REPORT   Patient Name:   Quaran Kedzierski. Date of Exam: 02/11/2022 Medical Rec #:  [id number]          Height:       68.0 in Accession #:    [id number]         Weight:       155.6 lb Date of Birth:  [birth year]           BSA:          1.837 m Patient Age:    37 years           BP:           115/46 mmHg Patient Gender: M                  HR:           64 bpm. Exam Location:  [facility] Procedure: 2D Echo, Cardiac Doppler, Color Doppler and Intracardiac            Opacification Agent Indications:    Pulmonary Embolus I26.09  History:        Patient has no prior history of Echocardiogram examinations.                 Myeloma; Risk Factors:Non-Smoker.  Sonographer:    Greer Pickerel Referring Phys: [place]  Sonographer Comments: Technically difficult study due to poor echo windows and Technically challenging study due to limited acoustic windows. Image acquisition challenging due to patient body habitus and Image acquisition challenging due to respiratory motion. IMPRESSIONS  1. Poor acoustic windows  2. Left ventricular ejection fraction, by estimation, is 60 to 65%. The left ventricle has normal function. Left  ventricular endocardial border not optimally defined to evaluate regional wall motion. Left ventricular diastolic parameters are consistent with Grade II diastolic dysfunction (pseudonormalization).  3. Right ventricular systolic function not well visualized but appears to be normal. The right ventricular size is mildly enlarged. There is mildly elevated pulmonary artery systolic pressure. The estimated right ventricular systolic pressure is 83.4 mmHg.  4. Left atrial size was severely dilated.  5. The mitral valve is abnormal. Trivial mitral valve regurgitation. No evidence of mitral stenosis. Moderate mitral annular calcification.  6. The aortic valve is calcified. Aortic valve regurgitation is not visualized. Aortic valve sclerosis/calcification is present, without any evidence of aortic stenosis.  7. Aortic dilatation noted. There is mild dilatation of the ascending aorta, measuring 40 mm.  8. The inferior vena cava is dilated in size with >50% respiratory variability, suggesting right atrial pressure of 8 mmHg. Comparison(s): No prior Echocardiogram. FINDINGS  Left Ventricle: Left ventricular ejection fraction, by estimation, is 60 to 65%. The left ventricle has normal function. Left ventricular endocardial border not optimally defined to evaluate regional wall motion. Definity contrast agent was given IV to delineate the left ventricular endocardial borders. The left ventricular internal cavity size was normal in size. There is no left ventricular hypertrophy. Left ventricular diastolic parameters are consistent with Grade II diastolic dysfunction (pseudonormalization). Right Ventricle: The right ventricular size is mildly enlarged. No increase in right ventricular wall thickness. Right ventricular systolic function not well visualized but appears to be normal. There is mildly elevated pulmonary artery systolic pressure. The tricuspid regurgitant velocity is 2.91 m/s, and with an assumed right atrial pressure  of 8 mmHg, the estimated right ventricular systolic pressure is 19.6 mmHg. Left Atrium: Left atrial size was severely dilated. Right Atrium: Right atrial size was normal in size. Pericardium: There is no evidence of pericardial effusion. Mitral Valve: The mitral valve is abnormal. Moderate mitral annular calcification. Trivial mitral valve regurgitation. No evidence of mitral valve stenosis. MV peak gradient, 4.3 mmHg. The mean mitral valve gradient is 1.0 mmHg. Tricuspid Valve: The tricuspid valve is not well visualized. Tricuspid valve regurgitation is not demonstrated. No evidence of tricuspid stenosis. Aortic Valve: The aortic valve is calcified. Aortic valve regurgitation is not visualized. Aortic valve sclerosis/calcification is present, without any evidence of aortic stenosis. Pulmonic Valve: The pulmonic valve was not well visualized. Pulmonic valve regurgitation is not visualized. No evidence of pulmonic stenosis. Aorta: The aortic root is normal in size and structure and aortic dilatation noted. There is mild dilatation of the ascending aorta, measuring 40 mm. Venous: The inferior vena cava is dilated in size with greater than 50% respiratory variability, suggesting right atrial pressure of 8 mmHg. IAS/Shunts: No atrial level shunt detected by color flow Doppler.  LEFT VENTRICLE PLAX 2D LVIDd:         5.80 cm   Diastology LVIDs:         3.90 cm   LV e' medial:    7.15 cm/s LV PW:         1.00 cm   LV E/e' medial:  13.2 LV IVS:        0.80  cm   LV e' lateral:   13.20 cm/s LVOT diam:     2.20 cm   LV E/e' lateral: 7.2 LV SV:         75 LV SV Index:   41 LVOT Area:     3.80 cm  RIGHT VENTRICLE RV S prime:     17.60 cm/s TAPSE (M-mode): 3.0 cm LEFT ATRIUM              Index        RIGHT ATRIUM           Index LA diam:        4.60 cm  2.50 cm/m   RA Area:     19.10 cm LA Vol (A2C):   145.0 ml 78.93 ml/m  RA Volume:   50.00 ml  27.22 ml/m LA Vol (A4C):   117.0 ml 63.69 ml/m LA Biplane Vol: 142.0 ml 77.29  ml/m  AORTIC VALVE LVOT Vmax:   107.00 cm/s LVOT Vmean:  66.700 cm/s LVOT VTI:    0.196 m  AORTA Ao Root diam: 3.60 cm Ao Asc diam:  4.00 cm MITRAL VALVE               TRICUSPID VALVE MV Area (PHT): 4.31 cm    TR Peak grad:   33.9 mmHg MV Area VTI:   2.31 cm    TR Vmax:        291.00 cm/s MV Peak grad:  4.3 mmHg MV Mean grad:  1.0 mmHg    SHUNTS MV Vmax:       1.04 m/s    Systemic VTI:  0.20 m MV Vmean:      53.8 cm/s   Systemic Diam: 2.20 cm MV Decel Time: 176 msec MR Peak grad: 66.3 mmHg MR Vmax:      407.00 cm/s MV E velocity: 94.60 cm/s MV A velocity: 70.00 cm/s MV E/A ratio:  1.35 Vishnu Priya Mallipeddi Electronically signed by Lorelee Cover Mallipeddi Signature Date/Time: 02/11/2022/4:39:00 PM    Final    CT Angio Chest PE W and/or Wo Contrast  Result Date: 02/10/2022 CLINICAL DATA:  Shortness of breath.  History of multiple myeloma. EXAM: CT ANGIOGRAPHY CHEST WITH CONTRAST TECHNIQUE: Multidetector CT imaging of the chest was performed using the standard protocol during bolus administration of intravenous contrast. Multiplanar CT image reconstructions and MIPs were obtained to evaluate the vascular anatomy. RADIATION DOSE REDUCTION: This exam was performed according to the departmental dose-optimization program which includes automated exposure control, adjustment of the mA and/or kV according to patient size and/or use of iterative reconstruction technique. CONTRAST:  28m OMNIPAQUE IOHEXOL 350 MG/ML SOLN COMPARISON:  PET scan of February 17, 2017. CT scan of November 30, 2012. FINDINGS: Cardiovascular: Filling defects is seen in the distal left pulmonary artery which extends into upper lobe branches. Filling defect is also noted in upper lobe branch of the right pulmonary artery. RV/LV ratio of 0.8 is noted which is within normal limits. Normal cardiac size. No pericardial effusion. Coronary artery calcifications are noted. Atherosclerosis of thoracic aorta is noted without aneurysm or dissection.  Mediastinum/Nodes: No enlarged mediastinal, hilar, or axillary lymph nodes. Thyroid gland, trachea, and esophagus demonstrate no significant findings. Lungs/Pleura: Small bilateral pleural effusions are noted with adjacent subsegmental atelectasis. No pneumothorax is noted. Right upper lobe opacity is noted most consistent with scarring or possibly atelectasis. Upper Abdomen: No acute abnormality. Musculoskeletal: Multiple old bilateral rib fractures are noted. Lytic lesions are noted in the manubrium  and sternum consistent with history of multiple myeloma. Lytic lesions are also noted in multiple thoracic vertebral bodies consistent with multiple myeloma. T3 and T11 compression fractures are noted most likely representing pathologic fractures of indeterminate age. Review of the MIP images confirms the above findings. IMPRESSION: Bilateral pulmonary emboli are noted, the largest being in distal portion of left pulmonary artery extending into upper lobe branches. Critical Value/emergent results were called by telephone at the time of interpretation on 02/10/2022 at 4:08 pm to provider Davonna Belling, who verbally acknowledged these results. Lytic lesions are noted in the manubrium, sternum and thoracic spine at multiple levels consistent with history of multiple myeloma. T3 and T11 compression fractures are noted most likely representing pathologic fractures of indeterminate age. Multiple probable old bilateral rib fractures are noted. Small bilateral pleural effusions are noted with minimal adjacent subsegmental atelectasis. Right upper lobe opacity is noted most consistent scarring or possibly atelectasis. Coronary artery calcifications are noted suggesting coronary disease. Aortic Atherosclerosis (ICD10-I70.0). Electronically Signed   By: Marijo Conception M.D.   On: 02/10/2022 16:08   DG Chest Port 1 View  Result Date: 02/10/2022 CLINICAL DATA:  Shortness of breath EXAM: PORTABLE CHEST 1 VIEW COMPARISON:  Bone  scan 09/19/2021.  Old x-ray 2016 FINDINGS: Right IJ chest port in place with tip along the SVC right atrial junction. Overlapping cardiac leads. Normal cardiopericardial silhouette. Calcified and tortuous aorta. Mild interstitial prominence. There is some bandlike opacity at the left lung base likely scar or atelectasis. Apical pleural thickening. No pneumothorax or effusion. There are some lytic lesions involving several ribs as well as several other bony structures including the right scapula, left scapula, right humerus. Please correlate with separate prior bone scan IMPRESSION: Mild basilar atelectasis on the left.  Chest port. Multiple lytic bone lesions as per patient's history Electronically Signed   By: Jill Side M.D.   On: 02/10/2022 12:31    ASSESSMENT: Multiple myeloma  PLAN:  Multiple myeloma: Please see note from September 17, 2020 for historic details of his myeloma and treatment.  Since reinitiating daratumumab, patient's M spike trended down to 3.6, but recently has increased back to 5.3.  IgG levels continue to increase and his most to 6950.  Today's results are pending.  Patient has only received 1 cycle of Pomalyst prior to his admission to the hospital.  We discussed discontinuing treatment and hospice, but patient wishes to pursue Pomalyst.  He expressed understanding that no other treatments would be available if Pomalyst were unsuccessful.  Proceed with dose reduced Pomalyst 3 mg daily for 21 days with 7 days off.  Return to clinic in 4 weeks after initiating Pomalyst for further evaluation.  Appreciate clinical pharmacy and palliative care input.   Thrombocytopenia: Resolved. Back/joint pain: Chronic and unchanged.  MRI results reviewed independently consistent with progressive myeloma.  No pathologic fractures reported.  Patient has completed palliative XRT.  Continue current narcotics as prescribed.    Peripheral neuropathy: Chronic and unchanged.  Continue gabapentin as  prescribed. Insomnia/anxiety: Chronic and unchanged.  Patient is now having his medications adjusted by primary care.   Fractured left clavicle: Patient does not complain of pain in this area.  Follow-up with orthopedics as indicated. Anemia: Hemoglobin has trended up to 9.8.  Monitor. Hypocalcemia: Given patient's low albumin this corrects to near normal.   Ear infection: Resolved. Pulmonary embolism: Patient is now on Eliquis.  Patient also confirms DNR/DNI.   Patient expressed understanding and was in agreement with this  plan. He also understands that He can call clinic at any time with any questions, concerns, or complaints.    Cancer Staging  No matching staging information was found for the patient.  Lloyd Huger, MD   02/20/2022 11:56 AM

## 2022-02-20 NOTE — Progress Notes (Signed)
Patient not seen today has his Pomalyst had been on hold post hospitalization. Pt seen by MD today and will resume treatment. CPP to see pt at next office visit.

## 2022-02-20 NOTE — Progress Notes (Signed)
Having weakness and loss of appetite. He is newly on eliquis and daughter wants heparin levels checked. Has not started taking metoprolol which was prescribed in the hospital. The daughter does not know why he is was put on it and she worries about the side effects. Would like to discuss further. Would like to know when to start back on pomalyst.

## 2022-02-21 LAB — KAPPA/LAMBDA LIGHT CHAINS
Kappa free light chain: 113.4 mg/L — ABNORMAL HIGH (ref 3.3–19.4)
Kappa, lambda light chain ratio: 16.68 — ABNORMAL HIGH (ref 0.26–1.65)
Lambda free light chains: 6.8 mg/L (ref 5.7–26.3)

## 2022-02-21 LAB — IGG, IGA, IGM
IgA: 45 mg/dL — ABNORMAL LOW (ref 61–437)
IgG (Immunoglobin G), Serum: 3911 mg/dL — ABNORMAL HIGH (ref 603–1613)
IgM (Immunoglobulin M), Srm: 30 mg/dL (ref 15–143)

## 2022-02-24 LAB — PROTEIN ELECTROPHORESIS, SERUM
A/G Ratio: 0.6 — ABNORMAL LOW (ref 0.7–1.7)
Albumin ELP: 2.9 g/dL (ref 2.9–4.4)
Alpha-1-Globulin: 0.3 g/dL (ref 0.0–0.4)
Alpha-2-Globulin: 0.7 g/dL (ref 0.4–1.0)
Beta Globulin: 0.8 g/dL (ref 0.7–1.3)
Gamma Globulin: 2.8 g/dL — ABNORMAL HIGH (ref 0.4–1.8)
Globulin, Total: 4.6 g/dL — ABNORMAL HIGH (ref 2.2–3.9)
M-Spike, %: 2.6 g/dL — ABNORMAL HIGH
Total Protein ELP: 7.5 g/dL (ref 6.0–8.5)

## 2022-02-28 ENCOUNTER — Other Ambulatory Visit (HOSPITAL_COMMUNITY): Payer: Self-pay

## 2022-03-03 ENCOUNTER — Other Ambulatory Visit: Payer: Self-pay | Admitting: Oncology

## 2022-03-03 ENCOUNTER — Other Ambulatory Visit: Payer: Self-pay | Admitting: Family Medicine

## 2022-03-03 ENCOUNTER — Other Ambulatory Visit: Payer: Self-pay | Admitting: Nurse Practitioner

## 2022-03-03 DIAGNOSIS — J301 Allergic rhinitis due to pollen: Secondary | ICD-10-CM

## 2022-03-03 DIAGNOSIS — C9000 Multiple myeloma not having achieved remission: Secondary | ICD-10-CM

## 2022-03-04 ENCOUNTER — Telehealth: Payer: Self-pay

## 2022-03-04 NOTE — Telephone Encounter (Signed)
Outreach to patients daughter, [name], to complete PC check in and schedule home visit.  Call unsuccessful. SW LVM.

## 2022-03-13 ENCOUNTER — Other Ambulatory Visit: Payer: Self-pay

## 2022-03-13 DIAGNOSIS — Z79899 Other long term (current) drug therapy: Secondary | ICD-10-CM

## 2022-03-17 ENCOUNTER — Other Ambulatory Visit: Payer: Self-pay

## 2022-03-19 ENCOUNTER — Telehealth: Payer: Self-pay | Admitting: Pharmacist

## 2022-03-19 ENCOUNTER — Other Ambulatory Visit: Payer: Self-pay | Admitting: *Deleted

## 2022-03-19 ENCOUNTER — Other Ambulatory Visit: Payer: Self-pay | Admitting: Pharmacist

## 2022-03-19 DIAGNOSIS — C9000 Multiple myeloma not having achieved remission: Secondary | ICD-10-CM

## 2022-03-19 MED ORDER — POMALIDOMIDE 3 MG PO CAPS: 3.0000 mg | ORAL_CAPSULE | Freq: Every day | ORAL | 0 refills | Status: DC

## 2022-03-19 NOTE — Progress Notes (Signed)
  Care Management & Coordination Services Pharmacy Team   Reason for Encounter: Appointment Reminder   Contacted patient to confirm in office appointment with Leata Mouse, PharmD on 03/21/22 at [facility]. {US HC Outreach:[id number]}  Do you have any problems getting your medications? {yes/no:20286} If yes what types of problems are you experiencing? {Problems:27223}  What is your top health concern you would like to discuss at your upcoming visit?   Have you seen any other providers since your last visit with PCP? Yes   Chart review:  Recent office visits:  11/12/21 [name], MD - Family Medicine - Chronic frontal sinusitis - Amoxicillin-clavulanate (AUGMENTIN) 875-125 MG tablet prescribed. Recommend d/c Xanax and replace with Ambien '10mg'$  at bedtime for insomnia. Follow up as scheduled.   Recent consult visits:  02/20/22 Delight Hoh, MD - Multiple Myeloma - Oncology - Treatment received. Follow up as scheduled  01/14/22 [place], [place] visit was initiated   01/08/22 Altha Harm NP - Multiple Myeloma - Oncology - Treatment received. Referral to social work placed. Follow up as scheduled in 1-2 months.   01/01/22 Timothu Finnegan MD - Oncology - Multiple Myeloma - Oncology treatment administered. Labs were ordered. Augmentin prescribed for otitis media. Follow up as scheduled.   12/04/21 Delight Hoh MD - Oncology - Right hip pain - Oncology treatment administered. XR of right pelvis ordered. Follow up as scheduled.   10/02/21 Delight Hoh MD - Oncology - Multiple Myeloma - Labs were ordered. Follow up in 4 weeks.   09/18/21 Murito Zanette PA-C - [facility] - Closed fracture of left clavicle - No notes available.    Hospital visits: 02/10/22 - 02/13/22 Medication Reconciliation was completed by comparing discharge summary, patient's EMR and Pharmacy list, and upon discussion with patient.  Admitted to the hospital on 02/10/22 due to Pulmonary Embolism.  Discharge date was 02/13/22. Discharged from [facility]?Medications Started at [facility] Discharge:?? apixaban (ELIQUIS) cefdinir (OMNICEF) cyanocobalamin (VITAMIN O16) folic acid (FOLVITE) metoprolol tartrate (LOPRESSOR)  Medication Changes at Hospital Discharge: dexamethasone (DECADRON)  Medications Discontinued at Hospital Discharge: amoxicillin-clavulanate 875-125 MG tablet (AUGMENTIN) ibuprofen 600 MG tablet (ADVIL) neomycin-polymyxin-hydrocortisone OTIC solution (CORTISPORIN) predniSONE 20 MG tablet (DELTASONE)  Medications that remain the same after Hospital Discharge:??  All other medications will remain the same.     Star Rating Drugs:  Medication:  Last Fill: Day Supply  No Star Rating Drugs noted  Care Gaps: Annual wellness visit in last year? Yes done 04/25/21  If Diabetic: Last eye exam / retinopathy screening: N/A Last diabetic foot exam: N/A   Future Appointments  Date Time Provider [place]  03/20/2022  9:30 AM [facility] None  03/20/2022 10:00 AM Lloyd Huger, MD [facility] None  03/20/2022 10:30 AM Borders, Kirt Boys, NP [facility] None  03/20/2022 10:30 AM Darl Pikes, RPH-CPP [facility] None  03/21/2022 10:00 AM Edythe Clarity, RPH [facility] None  05/01/2022  2:00 PM [facility]-NURSE HEALTH ADVISOR [facility] [place]

## 2022-03-20 ENCOUNTER — Inpatient Hospital Stay: Payer: Medicare Other | Attending: Oncology

## 2022-03-20 ENCOUNTER — Inpatient Hospital Stay: Payer: Medicare Other | Admitting: Pharmacist

## 2022-03-20 ENCOUNTER — Inpatient Hospital Stay (HOSPITAL_BASED_OUTPATIENT_CLINIC_OR_DEPARTMENT_OTHER): Payer: Medicare Other | Admitting: Oncology

## 2022-03-20 ENCOUNTER — Encounter: Payer: Self-pay | Admitting: Oncology

## 2022-03-20 ENCOUNTER — Inpatient Hospital Stay: Payer: Medicare Other | Admitting: Hospice and Palliative Medicine

## 2022-03-20 VITALS — BP 112/44 | HR 57 | Temp 96.8°F | Resp 18 | Ht 68.0 in | Wt 165.7 lb

## 2022-03-20 DIAGNOSIS — D72819 Decreased white blood cell count, unspecified: Secondary | ICD-10-CM | POA: Insufficient documentation

## 2022-03-20 DIAGNOSIS — Z7901 Long term (current) use of anticoagulants: Secondary | ICD-10-CM | POA: Diagnosis not present

## 2022-03-20 DIAGNOSIS — M549 Dorsalgia, unspecified: Secondary | ICD-10-CM | POA: Insufficient documentation

## 2022-03-20 DIAGNOSIS — C9000 Multiple myeloma not having achieved remission: Secondary | ICD-10-CM

## 2022-03-20 DIAGNOSIS — G8929 Other chronic pain: Secondary | ICD-10-CM | POA: Diagnosis not present

## 2022-03-20 DIAGNOSIS — G4700 Insomnia, unspecified: Secondary | ICD-10-CM | POA: Insufficient documentation

## 2022-03-20 DIAGNOSIS — G629 Polyneuropathy, unspecified: Secondary | ICD-10-CM | POA: Diagnosis not present

## 2022-03-20 DIAGNOSIS — I2699 Other pulmonary embolism without acute cor pulmonale: Secondary | ICD-10-CM | POA: Diagnosis not present

## 2022-03-20 DIAGNOSIS — D649 Anemia, unspecified: Secondary | ICD-10-CM | POA: Insufficient documentation

## 2022-03-20 DIAGNOSIS — D696 Thrombocytopenia, unspecified: Secondary | ICD-10-CM | POA: Diagnosis not present

## 2022-03-20 DIAGNOSIS — F419 Anxiety disorder, unspecified: Secondary | ICD-10-CM | POA: Diagnosis not present

## 2022-03-20 LAB — CBC WITH DIFFERENTIAL/PLATELET
Abs Immature Granulocytes: 0.02 10*3/uL (ref 0.00–0.07)
Basophils Absolute: 0 10*3/uL (ref 0.0–0.1)
Basophils Relative: 1 %
Eosinophils Absolute: 0 10*3/uL (ref 0.0–0.5)
Eosinophils Relative: 1 %
HCT: 31.6 % — ABNORMAL LOW (ref 39.0–52.0)
Hemoglobin: 9.9 g/dL — ABNORMAL LOW (ref 13.0–17.0)
Immature Granulocytes: 1 %
Lymphocytes Relative: 44 %
Lymphs Abs: 1.4 10*3/uL (ref 0.7–4.0)
MCH: 30.5 pg (ref 26.0–34.0)
MCHC: 31.3 g/dL (ref 30.0–36.0)
MCV: 97.2 fL (ref 80.0–100.0)
Monocytes Absolute: 0.7 10*3/uL (ref 0.1–1.0)
Monocytes Relative: 23 %
Neutro Abs: 1 10*3/uL — ABNORMAL LOW (ref 1.7–7.7)
Neutrophils Relative %: 30 %
Platelets: 110 10*3/uL — ABNORMAL LOW (ref 150–400)
RBC: 3.25 MIL/uL — ABNORMAL LOW (ref 4.22–5.81)
RDW: 16.3 % — ABNORMAL HIGH (ref 11.5–15.5)
WBC: 3.2 10*3/uL — ABNORMAL LOW (ref 4.0–10.5)
nRBC: 0 % (ref 0.0–0.2)

## 2022-03-20 LAB — CMP (CANCER CENTER ONLY)
ALT: 13 U/L (ref 0–44)
AST: 18 U/L (ref 15–41)
Albumin: 2.5 g/dL — ABNORMAL LOW (ref 3.5–5.0)
Alkaline Phosphatase: 35 U/L — ABNORMAL LOW (ref 38–126)
Anion gap: 6 (ref 5–15)
BUN: 25 mg/dL — ABNORMAL HIGH (ref 8–23)
CO2: 27 mmol/L (ref 22–32)
Calcium: 8 mg/dL — ABNORMAL LOW (ref 8.9–10.3)
Chloride: 105 mmol/L (ref 98–111)
Creatinine: 0.51 mg/dL — ABNORMAL LOW (ref 0.61–1.24)
GFR, Estimated: >60 mL/min (ref >60)
Glucose, Bld: 95 mg/dL (ref 70–99)
Potassium: 3.9 mmol/L (ref 3.5–5.1)
Sodium: 138 mmol/L (ref 135–145)
Total Bilirubin: 0.4 mg/dL (ref 0.3–1.2)
Total Protein: 6.1 g/dL — ABNORMAL LOW (ref 6.5–8.1)

## 2022-03-20 LAB — BASIC METABOLIC PANEL
Anion gap: 6 (ref 5–15)
BUN: 24 mg/dL — ABNORMAL HIGH (ref 8–23)
CO2: 27 mmol/L (ref 22–32)
Calcium: 7.8 mg/dL — ABNORMAL LOW (ref 8.9–10.3)
Chloride: 103 mmol/L (ref 98–111)
Creatinine, Ser: 0.5 mg/dL — ABNORMAL LOW (ref 0.61–1.24)
GFR, Estimated: >60 mL/min (ref >60)
Glucose, Bld: 94 mg/dL (ref 70–99)
Potassium: 3.8 mmol/L (ref 3.5–5.1)
Sodium: 136 mmol/L (ref 135–145)

## 2022-03-20 LAB — MAGNESIUM: Magnesium: 2.1 mg/dL (ref 1.7–2.4)

## 2022-03-20 NOTE — Progress Notes (Signed)
[place]  Telephone:([phone] [phone]  ID: Dean [name]. OB: 08-01-1935  MR#: [id number]  [place]:[id number]  Patient Care Team: [name], MD as PCP - General (Family Medicine) Gery Pray, MD as Consulting Physician (Radiation Oncology) Danie Binder, MD (Inactive) as Consulting Physician (Gastroenterology) Michael Boston, MD as Consulting Physician (General Surgery) Lloyd Huger, MD as Consulting Physician (Oncology)  CHIEF COMPLAINT: Multiple myeloma  INTERVAL HISTORY: Patient returns to clinic today for repeat laboratory work, further evaluation, and continuation of Pomalyst.  He continues to have weakness and fatigue and decreased performance status he otherwise is tolerating treatment well.  He has had no recent falls.  He denies any fevers.  He continues to have bilateral hip pain.  He has no neurologic complaints.  He denies any fevers.  He has a poor appetite.  He has no chest pain, shortness of breath, cough, or hemoptysis.  He denies any nausea, vomiting, constipation, or diarrhea.  He has no urinary complaints.  Patient offers no further specific complaints today.   REVIEW OF SYSTEMS:   Review of Systems  Constitutional:  Positive for malaise/fatigue. Negative for fever and weight loss.  Respiratory: Negative.  Negative for cough, hemoptysis and shortness of breath.   Cardiovascular: Negative.  Negative for chest pain and leg swelling.  Gastrointestinal: Negative.  Negative for abdominal pain and nausea.  Genitourinary:  Positive for flank pain. Negative for dysuria.  Musculoskeletal:  Positive for back pain and joint pain. Negative for falls.  Skin: Negative.  Negative for rash.  Neurological:  Positive for sensory change and weakness. Negative for dizziness, focal weakness and headaches.  Psychiatric/Behavioral: Negative.  The patient is not nervous/anxious and does not have insomnia.     As per HPI. Otherwise, a  complete review of systems is negative.  PAST MEDICAL HISTORY: Past Medical History:  Diagnosis Date   BPH (benign prostatic hyperplasia)    Colonic diverticular abscess 01/08/2015   Diverticulitis 123456   complicated by abscess and required percutaneous drainage   GERD (gastroesophageal reflux disease)    H/O ETOH abuse    History of chemotherapy last done jan 2017   History of radiation therapy 01/05/13-02/10/13   45 gray to left occipital condyle region   History of radiation therapy 12/01/16-12/10/16   Parasternal nodule, chest- 24 Gy total delivered in 8 fractions, Left sacro-iliac, pelvis- 24 Gy total delivered in 8 fractions    History of radiation therapy 02/19/17-02/22/17   right temporal scalp 30 Gy in 10 fractions   History of radiation therapy    Right hip( Pelvis) 11/18/21-11/21/21-Dr. Gery Pray   Intra-abdominal abscess ([place])    Multiple myeloma ([place]) 2015   Neuropathy    Radiation 02/21/14-03/08/14   right posterior chest wall area 30 gray   Radiation 04/19/14-05/02/14   lumbar spine 25 gray   Skull lesion    Left occipital condyle   Wrist fracture, left    x 2    PAST SURGICAL HISTORY: Past Surgical History:  Procedure Laterality Date   COLONOSCOPY N/A 04/19/2015   Procedure: COLONOSCOPY;  Surgeon: Danie Binder, MD;  [facility];  Service: Endoscopy;  Laterality: N/A;  1:30 PM   CYST EXCISION  1959   tail bone   IR IMAGING GUIDED PORT INSERTION  09/25/2017    FAMILY HISTORY: Family History  Problem Relation Age of Onset   Diabetes Mother    Stroke Mother    Hypertension Father  ADVANCED DIRECTIVES (Y/N):  N  HEALTH MAINTENANCE: Social History   Tobacco Use   Smoking status: Never   Smokeless tobacco: Never  Vaping Use   Vaping Use: Never used  Substance Use Topics   Alcohol use: No    Comment: " Not much no more"   Drug use: No     Colonoscopy:  PAP:  Bone density:  Lipid panel:  Allergies  Allergen Reactions    Codeine Other (See Comments)    Headache   Morphine And Related Other (See Comments)    Makes him feel weird   Revlimid [Lenalidomide] Other (See Comments)    "Causes me to become weak"    Current Outpatient Medications  Medication Sig Dispense Refill   acyclovir (ZOVIRAX) 400 MG tablet TAKE 1 TABLET BY MOUTH TWICE A DAY 60 tablet 6   ALPRAZolam (XANAX) 1 MG tablet TAKE 1 TABLET BY MOUTH 3 TIMES DAILY AS NEEDED FOR ANXIETY 90 tablet 0   amoxicillin (AMOXIL) 500 MG capsule Take 500 mg by mouth 2 (two) times daily.     apixaban (ELIQUIS) 5 MG TABS tablet Take 2 tablets (10 mg total) by mouth 2 (two) times daily. Then 1 tab (5 mg) two times daily starting 02/19/22 71 tablet 1   CALCIUM-VITAMIN D PO Take 1 tablet by mouth 2 (two) times daily.      cyanocobalamin (VITAMIN B12) 500 MCG tablet Take 1 tablet (500 mcg total) by mouth daily.     dexamethasone (DECADRON) 4 MG tablet TAKE 1 TABLET BY MOUTH ONCE A DAY 30 tablet 0   fluticasone (FLONASE) 50 MCG/ACT nasal spray PLACE 2 SPRAYS INTO BOTH NOSTRILS DAILY 16 g 6   gabapentin (NEURONTIN) 400 MG capsule TAKE 1 CAPSULE BY MOUTH 3 TIMES A DAY (Patient taking differently: Take 400 mg by mouth 3 (three) times daily. Pt takes twice daily) 90 capsule 4   Homeopathic Products (THERAWORX RELIEF EX) Apply topically as needed.     lidocaine-prilocaine (EMLA) cream Apply small amount over port one (1) hour prior to appointment. 30 g 0   montelukast (SINGULAIR) 10 MG tablet TAKE 1 TABLET BY MOUTH EVERY NIGHT AT BEDTIME 30 tablet 3   Oxycodone HCl 10 MG TABS TAKE 1 TO 2 TABLETS BY MOUTH 3 TIMES DAILY AS NEEDED 90 tablet 0   pomalidomide (POMALYST) 3 MG capsule Take 1 capsule (3 mg total) by mouth daily. Take for 21 days, then hold for 7 days. Repeat every 28 days. 21 capsule 0   tamsulosin (FLOMAX) 0.4 MG CAPS capsule TAKE ONE CAPSULE BY MOUTH EVERY NIGHT ATBEDTIME 30 capsule 3   traZODone (DESYREL) 100 MG tablet Take 3 tablets (300 mg total) by mouth at  bedtime. 90 tablet 1   TURMERIC PO Take 1 tablet by mouth 2 (two) times daily.     folic acid (FOLVITE) 1 MG tablet Take 1 tablet (1 mg total) by mouth daily. (Patient not taking: Reported on 02/20/2022)     furosemide (LASIX) 20 MG tablet TAKE 1 TABLET BY MOUTH ONCE A DAY AS NEEDED (Patient not taking: Reported on 02/20/2022) 30 tablet 3   metoprolol tartrate (LOPRESSOR) 25 MG tablet Take 0.5 tablets (12.5 mg total) by mouth 2 (two) times daily. (Patient not taking: Reported on 02/20/2022) 60 tablet 1   zolpidem (AMBIEN) 10 MG tablet Take 1 tablet (10 mg total) by mouth at bedtime as needed for sleep (do not take with xanax). (Patient not taking: Reported on 02/20/2022) 30 tablet 2  No current facility-administered medications for this visit.   Facility-Administered Medications Ordered in Other Visits  Medication Dose Route Frequency Provider Last Rate Last Admin   heparin lock flush 100 unit/mL  500 Units Intravenous Once Lloyd Huger, MD        OBJECTIVE: Vitals:   03/20/22 0900  BP: (!) 112/44  Pulse: (!) 57  Resp: 18  Temp: (!) 96.8 F (36 C)  SpO2: 100%      Body mass index is 25.19 kg/m.    ECOG FS:2 - Symptomatic, <50% confined to bed  General: Well-developed, well-nourished, no acute distress. Eyes: Pink conjunctiva, anicteric sclera. HEENT: Normocephalic, moist mucous membranes. Lungs: No audible wheezing or coughing. Heart: Regular rate and rhythm. Abdomen: Soft, nontender, no obvious distention. Musculoskeletal: No edema, cyanosis, or clubbing. Neuro: Alert, answering all questions appropriately. Cranial nerves grossly intact. Skin: No rashes or petechiae noted. Psych: Normal affect.  LAB RESULTS:  Lab Results  Component Value Date   NA 138 03/20/2022   K 3.9 03/20/2022   CL 105 03/20/2022   CO2 27 03/20/2022   GLUCOSE 95 03/20/2022   BUN 25 (H) 03/20/2022   CREATININE 0.51 (L) 03/20/2022   CALCIUM 8.0 (L) 03/20/2022   PROT 6.1 (L) 03/20/2022    ALBUMIN 2.5 (L) 03/20/2022   AST 18 03/20/2022   ALT 13 03/20/2022   ALKPHOS 35 (L) 03/20/2022   BILITOT 0.4 03/20/2022   GFRNONAA >60 03/20/2022   GFRAA >60 10/13/2019    Lab Results  Component Value Date   WBC 3.2 (L) 03/20/2022   NEUTROABS 1.0 (L) 03/20/2022   HGB 9.9 (L) 03/20/2022   HCT 31.6 (L) 03/20/2022   MCV 97.2 03/20/2022   PLT 110 (L) 03/20/2022     STUDIES: No results found.  ASSESSMENT: Multiple myeloma  PLAN:  Multiple myeloma: Please see note from September 17, 2020 for historic details of his myeloma and treatment.  Since reinitiating daratumumab, patient's M spike trended down to 3.6, but recently has increased back to 5.3 since reinitiating Pomalyst and has now trended down to 2.6.  IgG levels are also trending down and are now 3911.  Today's results are pending.  We previously discontinuing treatment and hospice, but patient wishes to pursue Pomalyst.  He expressed understanding that no other treatments would be available if Pomalyst were unsuccessful.  Proceed with dose reduced Pomalyst 3 mg daily for 21 days with 7 days off return to clinic in 4 weeks for further evaluation and continuation of treatment.  Appreciate clinical pharmacy and palliative care input.  Thrombocytopenia: Mild.  Likely secondary to Pomalyst.   Back/joint pain: Chronic and unchanged.  MRI results reviewed independently consistent with progressive myeloma.  No pathologic fractures reported.  Patient has completed palliative XRT.  Continue current narcotics as prescribed.    Peripheral neuropathy: Chronic and unchanged.  Continue gabapentin as prescribed. Insomnia/anxiety: Chronic and unchanged.  Patient is now having his medications adjusted by primary care.   Fractured left clavicle:  Follow-up with orthopedics as indicated. Anemia: Chronic and unchanged.  Patient's hemoglobin is 9.9 today. Hypocalcemia: Given patient's low albumin this corrects to near normal.   Ear infection:  Resolved. Pulmonary embolism: Patient is now on Eliquis. Leukopenia: Mild, monitor.  Patient also confirms DNR/DNI.   Patient expressed understanding and was in agreement with this plan. He also understands that He can call clinic at any time with any questions, concerns, or complaints.    Cancer Staging  No matching staging information was found  for the patient.  Lloyd Huger, MD   03/21/2022 9:26 AM

## 2022-03-20 NOTE — Progress Notes (Signed)
Patient not seen. Of note, Per MD patient is on Day 54 of 28 for his cycle.

## 2022-03-20 NOTE — Progress Notes (Signed)
Having urinary symptoms. Would like a UA done

## 2022-03-21 ENCOUNTER — Ambulatory Visit: Payer: Medicare Other | Admitting: Pharmacist

## 2022-03-21 ENCOUNTER — Encounter (HOSPITAL_COMMUNITY): Payer: Self-pay | Admitting: Oncology

## 2022-03-21 ENCOUNTER — Encounter: Payer: Self-pay | Admitting: *Deleted

## 2022-03-21 LAB — KAPPA/LAMBDA LIGHT CHAINS
Kappa free light chain: 85.8 mg/L — ABNORMAL HIGH (ref 3.3–19.4)
Kappa, lambda light chain ratio: 7.09 — ABNORMAL HIGH (ref 0.26–1.65)
Lambda free light chains: 12.1 mg/L (ref 5.7–26.3)

## 2022-03-21 NOTE — Progress Notes (Signed)
Care Management & Coordination Services Pharmacy Note  03/21/2022 Name:  Dean [name]. MRN:  [id number] DOB:  16-Sep-1935  Summary: Initial visit with PharmD.  Patient with multiple myeloma.  He is on last line treatments and wishes to continue to pursue - very supportive family.  NO major concerns with medication at this time.  His BP is on the lower end and they have stopped Toprol.  Mentions some leg swelling but it goes away with elevation.  Recommendations/Changes made from today's visit: No changes  Follow up plan: FU 6 months    Subjective: Dean [name]. is an 87 y.o. year old male who is a primary patient of Pickard, Cammie Mcgee, MD.  The care coordination team was consulted for assistance with disease management and care coordination needs.    Engaged with patient by telephone for initial visit.  Recent office visits:  11/12/21 [name], MD - Family Medicine - Chronic frontal sinusitis - Amoxicillin-clavulanate (AUGMENTIN) 875-125 MG tablet prescribed. Recommend d/c Xanax and replace with Ambien 32m at bedtime for insomnia. Follow up as scheduled.    Recent consult visits:  02/20/22 TDelight Hoh MD - Multiple Myeloma - Oncology - Treatment received. Follow up as scheduled   01[place]14/22 [place]    01/08/22 JAltha HarmNP - Multiple Myeloma - Oncology - Treatment received. Referral to social work placed. Follow up as scheduled in 1-2 months.    01/01/22 Timothu Finnegan MD - Oncology - Multiple Myeloma - Oncology treatment administered. Labs were ordered. Augmentin prescribed for otitis media. Follow up as scheduled.    12/04/21 TDelight HohMD - Oncology - Right hip pain - Oncology treatment administered. XR of right pelvis ordered. Follow up as scheduled.    10/02/21 TDelight HohMD - Oncology - Multiple Myeloma - Labs were ordered. Follow up in 4 weeks.    09/18/21 Murito Zanette PA-C - [facility] -  Closed fracture of left clavicle - No notes available.      Hospital visits: 02/10/22 - 02/13/22 Medication Reconciliation was completed by comparing discharge summary, patient's EMR and Pharmacy list, and upon discussion with patient.   Admitted to the hospital on 02/10/22 due to Pulmonary Embolism. Discharge date was 02/13/22. [facility] apixaban (ELIQUIS) cefdinir (OMNICEF) cyanocobalamin (VITAMIN B123456 folic acid (FOLVITE) metoprolol tartrate (LOPRESSOR)   Medication Changes at Hospital Discharge: dexamethasone (DECADRON)   Medications Discontinued at Hospital Discharge: amoxicillin-clavulanate 875-125 MG tablet (AUGMENTIN) ibuprofen 600 MG tablet (ADVIL) neomycin-polymyxin-hydrocortisone OTIC solution (CORTISPORIN) predniSONE 20 MG tablet (DELTASONE)   Medications that remain the same after Hospital Discharge:??  All other medications will remain the same.     Objective:  Lab Results  Component Value Date   CREATININE 0.51 (L) 03/20/2022   BUN 25 (H) 03/20/2022   EGFR 87 03/26/2021   GFRNONAA >60 03/20/2022   GFRAA >60 10/13/2019   NA 138 03/20/2022   K 3.9 03/20/2022   CALCIUM 8.0 (L) 03/20/2022   CO2 27 03/20/2022   GLUCOSE 95 03/20/2022    Lab Results  Component Value Date/Time   HGBA1C 5.6 07/06/2015 10:20 AM   HGBA1C 5.6 09/05/2013 10:40 AM    Last diabetic Eye exam: No results found for: "HMDIABEYEEXA"  Last diabetic Foot exam: No results found for: "HMDIABFOOTEX"   Lab Results  Component Value Date   CHOL 152 11/30/2012   HDL 40 11/30/2012  LDLCALC 92 11/30/2012   TRIG 100 11/30/2012   CHOLHDL 3.8 11/30/2012       Latest Ref Rng & Units 03/20/2022    9:14 AM 02/20/2022    9:16 AM 02/10/2022    1:06 PM  Hepatic Function  Total Protein 6.5 - 8.1 g/dL 6.1  7.9  5.7   Albumin 3.5 - 5.0 g/dL 2.5  2.5  2.0   AST 15 - 41 U/L 18  17  13   $ ALT 0 - 44 U/L 13  12  10   $ Alk  Phosphatase 38 - 126 U/L 35  64  49   Total Bilirubin 0.3 - 1.2 mg/dL 0.4  0.2  0.3     Lab Results  Component Value Date/Time   TSH 0.493 02/10/2022 01:04 PM   TSH 0.820 01/17/2019 10:08 AM   TSH 0.38 (L) 11/09/2018 11:23 AM   TSH 0.786 09/05/2013 10:40 AM   FREET4 0.97 02/10/2022 01:04 PM       Latest Ref Rng & Units 03/20/2022    9:13 AM 02/20/2022    9:16 AM 02/13/2022    5:00 AM  CBC  WBC 4.0 - 10.5 K/uL 3.2  6.6  4.8   Hemoglobin 13.0 - 17.0 g/dL 9.9  11.4  10.2   Hematocrit 39.0 - 52.0 % 31.6  35.9  32.8   Platelets 150 - 400 K/uL 110  242  196     Lab Results  Component Value Date/Time   VD25OH 44.1 09/18/2016 09:00 AM   VD25OH 47.6 07/10/2016 10:36 AM   VITAMINB12 164 (L) 02/10/2022 01:04 PM    Clinical ASCVD: No  The ASCVD Risk score (Arnett DK, et al., 2019) failed to calculate for the following reasons:   The 2019 ASCVD risk score is only valid for ages 61 to 54        01/09/2022    2:01 PM 04/25/2021    2:10 PM 05/23/2020   10:08 AM  Depression screen PHQ 2/9  Decreased Interest 1 0 1  Down, Depressed, Hopeless 2 0 1  PHQ - 2 Score 3 0 2  Altered sleeping 0  1  Tired, decreased energy 3  1  Change in appetite 0  1  Feeling bad or failure about yourself  0  1  Trouble concentrating 2  1  Moving slowly or fidgety/restless 2  1  Suicidal thoughts 0  0  PHQ-9 Score 10  8  Difficult doing work/chores Very difficult  Somewhat difficult     Social History   Tobacco Use  Smoking Status Never  Smokeless Tobacco Never   BP Readings from Last 3 Encounters:  03/20/22 (!) 112/44  02/20/22 122/68  02/13/22 (!) 126/56   Pulse Readings from Last 3 Encounters:  03/20/22 (!) 57  02/20/22 (!) 50  02/13/22 69   Wt Readings from Last 3 Encounters:  03/20/22 165 lb 11.2 oz (75.2 kg)  02/20/22 147 lb (66.7 kg)  02/10/22 155 lb 10.3 oz (70.6 kg)   BMI Readings from Last 3 Encounters:  03/20/22 25.19 kg/m  02/20/22 22.35 kg/m  02/11/22 23.67 kg/m     Allergies  Allergen Reactions   Codeine Other (See Comments)    Headache   Morphine And Related Other (See Comments)    Makes him feel weird   Revlimid [Lenalidomide] Other (See Comments)    "Causes me to become weak"    Medications Reviewed Today     Reviewed by Edythe Clarity, RPH (  Pharmacist) on 03/21/22 at 1038  Med List Status: <None>   Medication Order Taking? Sig Documenting Provider Last Dose Status Informant  acyclovir (ZOVIRAX) 400 MG tablet [id number] Yes TAKE 1 TABLET BY MOUTH TWICE A DAY Finnegan, Kathlene November, MD Taking Active   ALPRAZolam Duanne Moron) 1 MG tablet [id number] Yes TAKE 1 TABLET BY MOUTH 3 TIMES DAILY AS NEEDED FOR ANXIETY Lloyd Huger, MD Taking Active Child, Pharmacy Records  amoxicillin (AMOXIL) 500 MG capsule [id number] Yes Take 500 mg by mouth 2 (two) times daily. [provider] Taking Active   apixaban (ELIQUIS) 5 MG TABS tablet [id number] Yes Take 2 tablets (10 mg total) by mouth 2 (two) times daily. Then 1 tab (5 mg) two times daily starting 02/19/22 Orson Eva, MD Taking Active   CALCIUM-VITAMIN D PO [id number] Yes Take 1 tablet by mouth 2 (two) times daily.  [provider] Taking Active Child, Pharmacy Records  cyanocobalamin (VITAMIN B12) 500 MCG tablet [id number] Yes Take 1 tablet (500 mcg total) by mouth daily. Orson Eva, MD Taking Active   dexamethasone (DECADRON) 4 MG tablet [id number] Yes TAKE 1 TABLET BY MOUTH ONCE A DAY Grayland Ormond, Kathlene November, MD Taking Active   fluticasone [facility]) 50 MCG/ACT nasal spray [id number] Yes PLACE 2 SPRAYS INTO BOTH NOSTRILS DAILY [name], MD Taking Active   folic acid (FOLVITE) 1 MG tablet [id number] Yes Take 1 tablet (1 mg total) by mouth daily. Orson Eva, MD Taking Active   furosemide (LASIX) 20 MG tablet [id number] No TAKE 1 TABLET BY MOUTH ONCE A DAY AS NEEDED  Patient not taking: Reported on 02/20/2022   Derek Jack, MD Not Taking Active Child, Pharmacy Records  gabapentin  (NEURONTIN) 400 MG capsule [id number] Yes TAKE 1 CAPSULE BY MOUTH 3 TIMES A DAY  Patient taking differently: Take 400 mg by mouth 3 (three) times daily. Pt takes twice daily   Grayland Ormond, Kathlene November, MD Taking Active Child, Pharmacy Records  Homeopathic Products ([place]) [id number] Yes Apply topically as needed. [provider] Taking Active Child, Pharmacy Records  lidocaine-prilocaine (EMLA) cream [id number] No Apply small amount over port one (1) hour prior to appointment.  Patient not taking: Reported on 03/21/2022   Derek Jack, MD Not Taking Active Child, Pharmacy Records  montelukast (SINGULAIR) 10 MG tablet [id number] Yes TAKE 1 TABLET BY MOUTH EVERY NIGHT AT BEDTIME [name], MD Taking Active   Oxycodone HCl 10 MG TABS [id number] Yes TAKE 1 TO 2 TABLETS BY MOUTH 3 TIMES DAILY AS NEEDED Grayland Ormond Kathlene November, MD Taking Active   pomalidomide (POMALYST) 3 MG capsule [id number] Yes Take 1 capsule (3 mg total) by mouth daily. Take for 21 days, then hold for 7 days. Repeat every 28 days. Lloyd Huger, MD Taking Active   tamsulosin [facility]) 0.4 MG CAPS capsule [id number] Yes TAKE ONE CAPSULE BY MOUTH EVERY NIGHT ATBEDTIME [name], MD Taking Active Child, Pharmacy Records  traZODone (DESYREL) 100 MG tablet [id number] Yes Take 3 tablets (300 mg total) by mouth at bedtime. Lloyd Huger, MD Taking Active Child, Pharmacy Records  TURMERIC PO [id number] Yes Take 1 tablet by mouth 2 (two) times daily. [provider] Taking Active Child, Pharmacy Records  Med List Note Darl Pikes, RPH-CPP 01/09/22 1514): Pomalyst filled at [place]:  (Social Determinants of Health) assessments and interventions performed: Yes Financial Resource Strain: Low Risk  (03/21/2022)   Overall Financial  Resource Strain (CARDIA)    Difficulty of Paying Living Expenses: Not very hard   Food Insecurity: No Food Insecurity (03/21/2022)    Hunger Vital Sign    Worried About Running Out of Food in the Last Year: Never true    Ran Out of Food in the Last Year: Never true    SDOH Interventions    Flowsheet Row Telephone from 02/14/2022 in [facility] to [place] (Discharged) from 02/10/2022 in [place] from 01/09/2022 in [place] at [place] from 04/25/2021 in [place] Interventions      Food Insecurity Interventions Intervention Not Indicated -- Intervention Not Indicated Intervention Not Indicated  Housing Interventions Intervention Not Indicated Intervention Not Indicated Intervention Not Indicated Intervention Not Indicated  Transportation Interventions -- -- -- Intervention Not Indicated  Utilities Interventions -- -- Intervention Not Indicated --  Alcohol Usage Interventions -- -- Intervention Not Indicated (Score <7) --  Depression Interventions/Treatment  -- -- Counseling --  Financial Strain Interventions -- -- Intervention Not Indicated Intervention Not Indicated  Physical Activity Interventions -- -- Intervention Not Indicated Other (Comments)  [Encourged pt to increase as tolerated.]  Stress Interventions -- -- Provide Counseling Intervention Not Indicated  Social Connections Interventions -- -- Intervention Not Indicated Intervention Not Indicated       Medication Assistance: None required.  Patient affirms current coverage meets needs.  Medication Access: Within the past 30 days, how often has patient missed a dose of medication? 0 Is a pillbox or other method used to improve adherence? Yes  Factors that may affect medication adherence? no barriers identified Are meds synced by current pharmacy? No  Are meds delivered by current pharmacy? No  Does patient experience delays in picking up medications due to transportation concerns? No   [facility]  Reviewed: Is patient disadvantaged to use [facility]?: No  Current Rx insurance plan: Medicare/Medicaid Name and location of Current pharmacy:  [facility], [place] [address] Phone: [phone] Fax: [phone]  [facility], [place] - [address] [place] [address] Phone: [phone] Fax: [phone]  [facility] services reviewed with patient today?: Yes  Patient requests to transfer care to [facility]?: No  Reason patient declined to change pharmacies: Loyalty to other pharmacy/Patient preference  Star Rating Drugs:  Medication:                Last Fill:         Day Supply   No Star Rating Drugs noted   Care Gaps: Annual wellness visit in last year? Yes done 04/25/21   If Diabetic: Last eye exam / retinopathy screening: N/A Last diabetic foot exam: N/A   Assessment/Plan Multiple Myeloma (Goal: Improve QOL) -Controlled -Current treatment  Pomalidomide 71m Appropriate, Effective, Safe, Accessible -Medications previously tried: none noted  -Recommended to continue current medication His desire to continue to treat, this is his last line option.  Markers have continued to decrease while he has been taking this.  He continues to wish to pursue treatment at this time.  Family supports this decision.  No changes at this time.   PE (Goal: Prevent recurrence) -Controlled -Current treatment  Eliquis 525mbid Appropriate, Effective, Safe, Accessible -Medications previously tried: none noted -Recent PE, started Eliquis. -Denies any bleeding in stool or urine  -Recommended to continue current  medication Dose appropriate for age, weight, Scr.  He has gained some weight lately which is a great improvement!  Continue to monitor weight and kidneys for dose adjustments at regular lab visits.  Insomnia (Goal: Adequate sleep) -Controlled -Current treatment   Trazodone 149m Appropriate, Effective, Safe, Accessible Xanax 159mbid prn Appropriate, Effective, Safe, Accessible -Medications previously tried: Ambien (no benefit)  -Recommended to continue current medication He is sleeping well most nights, does not have to get up very often to use the bathroom.  No changes at this time - report sleep issues moving forward.  ChBeverly MilchPharmD, CPP Clinical Pharmacist Practitioner [id number] [phone]

## 2022-03-21 NOTE — Addendum Note (Signed)
Addended by: Darl Pikes on: 03/21/2022 09:16 AM   Modules accepted: Orders

## 2022-03-22 LAB — IGG, IGA, IGM
IgA: 32 mg/dL — ABNORMAL LOW (ref 61–437)
IgG (Immunoglobin G), Serum: 2115 mg/dL — ABNORMAL HIGH (ref 603–1613)
IgM (Immunoglobulin M), Srm: 22 mg/dL (ref 15–143)

## 2022-03-24 ENCOUNTER — Ambulatory Visit
Admission: RE | Admit: 2022-03-24 | Discharge: 2022-03-24 | Disposition: A | Discharge status: Home / Self Care | Payer: Self-pay | Source: Ambulatory Visit | Attending: Oncology | Admitting: Oncology

## 2022-03-24 ENCOUNTER — Other Ambulatory Visit: Payer: Self-pay | Admitting: Oncology

## 2022-03-24 ENCOUNTER — Other Ambulatory Visit: Payer: Self-pay | Admitting: *Deleted

## 2022-03-24 DIAGNOSIS — M25519 Pain in unspecified shoulder: Secondary | ICD-10-CM

## 2022-03-24 DIAGNOSIS — C9000 Multiple myeloma not having achieved remission: Secondary | ICD-10-CM

## 2022-03-24 DIAGNOSIS — M898X1 Other specified disorders of bone, shoulder: Secondary | ICD-10-CM

## 2022-03-24 DIAGNOSIS — G4700 Insomnia, unspecified: Secondary | ICD-10-CM

## 2022-03-24 LAB — PROTEIN ELECTROPHORESIS, SERUM
A/G Ratio: 0.8 (ref 0.7–1.7)
Albumin ELP: 2.6 g/dL — ABNORMAL LOW (ref 2.9–4.4)
Alpha-1-Globulin: 0.3 g/dL (ref 0.0–0.4)
Alpha-2-Globulin: 0.6 g/dL (ref 0.4–1.0)
Beta Globulin: 0.8 g/dL (ref 0.7–1.3)
Gamma Globulin: 1.6 g/dL (ref 0.4–1.8)
Globulin, Total: 3.3 g/dL (ref 2.2–3.9)
M-Spike, %: 1.5 g/dL — ABNORMAL HIGH
Total Protein ELP: 5.9 g/dL — ABNORMAL LOW (ref 6.0–8.5)

## 2022-03-27 ENCOUNTER — Telehealth: Payer: Self-pay
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
## 2022-03-31 ENCOUNTER — Ambulatory Visit: Payer: Medicare Other | Admitting: Family Medicine

## 2022-04-03 ENCOUNTER — Encounter: Payer: Self-pay | Admitting: Radiology

## 2022-04-11 ENCOUNTER — Encounter (HOSPITAL_COMMUNITY): Payer: Self-pay | Admitting: Oncology

## 2022-04-11 ENCOUNTER — Other Ambulatory Visit: Payer: Self-pay | Admitting: Oncology

## 2022-04-11 DIAGNOSIS — C9000 Multiple myeloma not having achieved remission: Secondary | ICD-10-CM

## 2022-04-11 DIAGNOSIS — G629 Polyneuropathy, unspecified: Secondary | ICD-10-CM

## 2022-04-12 ENCOUNTER — Other Ambulatory Visit: Payer: Self-pay | Admitting: Oncology

## 2022-04-15 ENCOUNTER — Other Ambulatory Visit: Payer: Self-pay

## 2022-04-15 DIAGNOSIS — C9000 Multiple myeloma not having achieved remission: Secondary | ICD-10-CM

## 2022-04-15 MED ORDER — POMALIDOMIDE 3 MG PO CAPS: 3.0000 mg | ORAL_CAPSULE | Freq: Every day | ORAL | 0 refills | Status: DC

## 2022-04-17 ENCOUNTER — Other Ambulatory Visit: Payer: Medicare Other

## 2022-04-17 ENCOUNTER — Ambulatory Visit: Payer: Medicare Other | Admitting: Pharmacist

## 2022-04-17 ENCOUNTER — Ambulatory Visit: Payer: Medicare Other | Admitting: Oncology

## 2022-04-18 ENCOUNTER — Other Ambulatory Visit: Payer: Self-pay | Admitting: *Deleted

## 2022-04-18 ENCOUNTER — Other Ambulatory Visit: Payer: Self-pay | Admitting: Oncology

## 2022-04-18 DIAGNOSIS — C9000 Multiple myeloma not having achieved remission: Secondary | ICD-10-CM

## 2022-04-21 ENCOUNTER — Inpatient Hospital Stay: Payer: Medicare Other | Attending: Oncology

## 2022-04-21 ENCOUNTER — Other Ambulatory Visit: Payer: Self-pay

## 2022-04-21 ENCOUNTER — Ambulatory Visit: Payer: Medicare Other | Admitting: Pharmacist

## 2022-04-21 ENCOUNTER — Inpatient Hospital Stay (HOSPITAL_BASED_OUTPATIENT_CLINIC_OR_DEPARTMENT_OTHER): Payer: Medicare Other | Admitting: Oncology

## 2022-04-21 ENCOUNTER — Inpatient Hospital Stay (HOSPITAL_BASED_OUTPATIENT_CLINIC_OR_DEPARTMENT_OTHER): Payer: Medicare Other | Admitting: Hospice and Palliative Medicine

## 2022-04-21 ENCOUNTER — Ambulatory Visit: Payer: Medicare Other | Admitting: Oncology

## 2022-04-21 ENCOUNTER — Encounter: Payer: Self-pay | Admitting: Licensed Clinical Social Worker

## 2022-04-21 ENCOUNTER — Encounter: Payer: Self-pay | Admitting: Oncology

## 2022-04-21 ENCOUNTER — Other Ambulatory Visit: Payer: Medicare Other

## 2022-04-21 ENCOUNTER — Inpatient Hospital Stay: Payer: Medicare Other | Admitting: Pharmacist

## 2022-04-21 DIAGNOSIS — I2699 Other pulmonary embolism without acute cor pulmonale: Secondary | ICD-10-CM | POA: Insufficient documentation

## 2022-04-21 DIAGNOSIS — C9000 Multiple myeloma not having achieved remission: Secondary | ICD-10-CM | POA: Insufficient documentation

## 2022-04-21 DIAGNOSIS — Z9181 History of falling: Secondary | ICD-10-CM | POA: Diagnosis not present

## 2022-04-21 DIAGNOSIS — Z7901 Long term (current) use of anticoagulants: Secondary | ICD-10-CM | POA: Diagnosis not present

## 2022-04-21 DIAGNOSIS — D649 Anemia, unspecified: Secondary | ICD-10-CM | POA: Insufficient documentation

## 2022-04-21 DIAGNOSIS — G629 Polyneuropathy, unspecified: Secondary | ICD-10-CM | POA: Diagnosis not present

## 2022-04-21 DIAGNOSIS — G893 Neoplasm related pain (acute) (chronic): Secondary | ICD-10-CM | POA: Diagnosis not present

## 2022-04-21 DIAGNOSIS — M2550 Pain in unspecified joint: Secondary | ICD-10-CM | POA: Insufficient documentation

## 2022-04-21 DIAGNOSIS — G8929 Other chronic pain: Secondary | ICD-10-CM | POA: Diagnosis not present

## 2022-04-21 DIAGNOSIS — Z515 Encounter for palliative care: Secondary | ICD-10-CM

## 2022-04-21 DIAGNOSIS — G4700 Insomnia, unspecified: Secondary | ICD-10-CM | POA: Diagnosis not present

## 2022-04-21 DIAGNOSIS — M549 Dorsalgia, unspecified: Secondary | ICD-10-CM | POA: Insufficient documentation

## 2022-04-21 LAB — CBC WITH DIFFERENTIAL/PLATELET
Abs Immature Granulocytes: 0.04 10*3/uL (ref 0.00–0.07)
Basophils Absolute: 0.1 10*3/uL (ref 0.0–0.1)
Basophils Relative: 1 %
Eosinophils Absolute: 0 10*3/uL (ref 0.0–0.5)
Eosinophils Relative: 0 %
HCT: 37.1 % — ABNORMAL LOW (ref 39.0–52.0)
Hemoglobin: 11.6 g/dL — ABNORMAL LOW (ref 13.0–17.0)
Immature Granulocytes: 1 %
Lymphocytes Relative: 32 %
Lymphs Abs: 1.6 10*3/uL (ref 0.7–4.0)
MCH: 30.6 pg (ref 26.0–34.0)
MCHC: 31.3 g/dL (ref 30.0–36.0)
MCV: 97.9 fL (ref 80.0–100.0)
Monocytes Absolute: 1.2 10*3/uL — ABNORMAL HIGH (ref 0.1–1.0)
Monocytes Relative: 24 %
Neutro Abs: 2.1 10*3/uL (ref 1.7–7.7)
Neutrophils Relative %: 42 %
Platelets: 191 10*3/uL (ref 150–400)
RBC: 3.79 MIL/uL — ABNORMAL LOW (ref 4.22–5.81)
RDW: 17 % — ABNORMAL HIGH (ref 11.5–15.5)
WBC: 4.9 10*3/uL (ref 4.0–10.5)
nRBC: 0 % (ref 0.0–0.2)

## 2022-04-21 LAB — COMPREHENSIVE METABOLIC PANEL
ALT: 12 U/L (ref 0–44)
AST: 17 U/L (ref 15–41)
Albumin: 3 g/dL — ABNORMAL LOW (ref 3.5–5.0)
Alkaline Phosphatase: 30 U/L — ABNORMAL LOW (ref 38–126)
Anion gap: 5 (ref 5–15)
BUN: 26 mg/dL — ABNORMAL HIGH (ref 8–23)
CO2: 28 mmol/L (ref 22–32)
Calcium: 8.3 mg/dL — ABNORMAL LOW (ref 8.9–10.3)
Chloride: 105 mmol/L (ref 98–111)
Creatinine, Ser: 0.64 mg/dL (ref 0.61–1.24)
GFR, Estimated: >60 mL/min (ref >60)
Glucose, Bld: 88 mg/dL (ref 70–99)
Potassium: 3.7 mmol/L (ref 3.5–5.1)
Sodium: 138 mmol/L (ref 135–145)
Total Bilirubin: 0.5 mg/dL (ref 0.3–1.2)
Total Protein: 6.7 g/dL (ref 6.5–8.1)

## 2022-04-21 MED ORDER — OXYCODONE HCL 10 MG PO TABS: ORAL_TABLET | ORAL | 0 refills | Status: DC

## 2022-04-21 MED ORDER — GABAPENTIN 400 MG PO CAPS: 400.0000 mg | ORAL_CAPSULE | Freq: Three times a day (TID) | ORAL | 4 refills | Status: DC

## 2022-04-21 MED ORDER — APIXABAN 5 MG PO TABS: ORAL_TABLET | ORAL | 1 refills | Status: DC

## 2022-04-21 MED ORDER — ALPRAZOLAM 1 MG PO TABS: 1.0000 mg | ORAL_TABLET | Freq: Three times a day (TID) | ORAL | 0 refills | Status: DC | PRN

## 2022-04-21 MED ORDER — ACYCLOVIR 400 MG PO TABS: 400.0000 mg | ORAL_TABLET | Freq: Two times a day (BID) | ORAL | 6 refills | Status: DC

## 2022-04-21 MED ORDER — DEXAMETHASONE 4 MG PO TABS: 4.0000 mg | ORAL_TABLET | Freq: Every day | ORAL | 0 refills | Status: DC

## 2022-04-21 NOTE — Progress Notes (Signed)
Bilateral ankle swelling. No SOB noted. Daughter wanted to know if he needs to go back on lasix.

## 2022-04-21 NOTE — Progress Notes (Signed)
[place] at [facility] Telephone:([phone]) [phone] Fax:(336) [phone]  Patient Care Team: [name], MD as PCP - General (Family Medicine) Gery Pray, MD as Consulting Physician (Radiation Oncology) Danie Binder, MD (Inactive) as Consulting Physician (Gastroenterology) Michael Boston, MD as Consulting Physician (General Surgery) Lloyd Huger, MD as Consulting Physician (Oncology)   NAME OF PATIENT: Dean [name]  CR:[id number]  12-07-1935   DATE OF VISIT: 04/21/22  REASON FOR CONSULT: Dean [name]. is a 87 y.o. male with multiple medical problems including multiple myeloma with disease progression currently on treatment.  Patient has had significant pain and declining performance status.  He has had recurrent falls including previous hip fracture.  Palliative care was consulted to address goals.  SOCIAL HISTORY:     reports that he has never smoked. He has never used smokeless tobacco. He reports that he does not drink alcohol and does not use drugs.  Patient lives at home with his significant other.  She has dementia and he is her primary caregiver.  Patient has 2 daughters and a son who lives nearby.  Patient previously worked as a Programmer, systems.  ADVANCE DIRECTIVES:  Does not have  CODE STATUS: DNR/DNI (DNR order signed on 09/04/21)   PAST MEDICAL HISTORY: Past Medical History:  Diagnosis Date   BPH (benign prostatic hyperplasia)    Colonic diverticular abscess 01/08/2015   Diverticulitis [id number]   complicated by abscess and required percutaneous drainage   GERD (gastroesophageal reflux disease)    H/O ETOH abuse    History of chemotherapy last done jan 2017   History of radiation therapy 01/05/13-02/10/13   45 gray to left occipital condyle region   History of radiation therapy 12/01/16-12/10/16   Parasternal nodule, chest- 24 Gy total delivered in 8 fractions, Left sacro-iliac, pelvis- 24 Gy  total delivered in 8 fractions    History of radiation therapy 02/19/17-02/22/17   right temporal scalp 30 Gy in 10 fractions   History of radiation therapy    Right hip( Pelvis) 11/18/21-11/21/21-Dr. Gery Pray   Intra-abdominal abscess ([place])    Multiple myeloma ([place]) 2015   Neuropathy    Radiation 02/21/14-03/08/14   right posterior chest wall area 30 gray   Radiation 04/19/14-05/02/14   lumbar spine 25 gray   Skull lesion    Left occipital condyle   Wrist fracture, left    x 2    PAST SURGICAL HISTORY:  Past Surgical History:  Procedure Laterality Date   COLONOSCOPY N/A 04/19/2015   Procedure: COLONOSCOPY;  Surgeon: Danie Binder, MD;  Location: [facility];  Service: Endoscopy;  Laterality: N/A;  1:30 PM   CYST EXCISION  1959   tail bone   IR IMAGING GUIDED PORT INSERTION  09/25/2017    HEMATOLOGY/ONCOLOGY HISTORY:  Oncology History  Multiple myeloma without remission ([place])  11/16/2012 Initial Diagnosis   L occipital condyle destructive lesion, not biopsied secondary to location. XRT   12/01/2012 Imaging   L3 superior endplate compression FX, no lytic lesions to suggest myeloma   02/06/2014 Imaging   soft tissue mass along the right posterior fifth rib with some rib destruction   02/16/2014 Miscellaneous   Normal CBC, Normal CMP, kappa,lamda ratio of 2.62 (abnormal), UIEP with slightly restricted band, IgG kappa, SPEP/IEP with monoclonal protein at 0.79 g/dl, normal igG, suppressed IGM   02/20/2014 Bone Marrow Biopsy   33% kappa restricted plasma cells Normal FISH, normal cytogenetics   02/21/2014 -  03/08/2014 Radiation Therapy   30Gy to R rib lesion, lesion not biopsied   03/16/2014 PET scan   Scattered hypermetabolic osseous lesions, including the calvarium, ribs, left scapula, sacrum, and left femoral shaft. An expansile left vertebral body lesion at L3 extends into the epidural space of the left lateral recess,    03/17/2014 - 04/07/2014 Chemotherapy   Velcade and  Dexamethasone due to initial denial of Revlimid by insurance company.  Zometa monthly.   03/22/2014 Imaging   MR_ L-Spine- Enhancing lesions compatible with multiple myeloma at L2, L3, and S1-2.   04/07/2014 - 07/28/2014 Chemotherapy   RVD with Revlimid at 25 mg days 1-14.  Revlimid dose reduced to 25 mg every other day x 14 days with 7 day respite beginning on day 1 of cycle 2.   04/17/2014 Adverse Reaction   Revlimid-induced rash.  Treated with steroids.   07/28/2014 - 08/04/2014 Chemotherapy   Revlimid daily   08/04/2014 Adverse Reaction   Velcade-induced peripheral neuropathy   08/04/2014 Treatment Plan Change   D/C Velcade   08/11/2014 PET scan   Response to therapy. Improvement and resolution of foci of osseous hypermetabolism.   08/22/2014 - 08/28/2014 Chemotherapy   Revlimid 25 mg days 1-21 every 28 days   08/28/2014 Adverse Reaction   Revlimid-induced rash. Medication held.  Medrol dose Pak prescribed.   09/04/2014 - 01/08/2015 Chemotherapy   Revlimid 10 mg every other day, without dexamethasone   01/08/2015 - 01/15/2015 Hospital Admission   Colonic diverticular abscess with IR drain placement   03/02/2015 PET scan   Only bony uptake is low activity at site of deformity/callus at R second rib FX, high activity in sigmoid colon, advanced sigmoid divertidulosis. Abscess not resolved?   06/04/2015 Imaging   CT nonobstructive L renal calculus, diverticulosis of descending and sigmoid colon without inflammation, moderate prostatic enlargement   07/12/2015 Surgery   Diverticulitis s/p robotic sigmoid colectomy with Dr. Johney Maine   08/23/2015 PET scan   Primarily similar hypermetabolic osseous foci within the R sided ribs. new L third rib focus of hypermetab and sclerosis is favored to be related to healing FX. no soft tissue myeloma id'd. Presumed postop hypermetab and edema about the R pelvic wall    03/06/2016 PET scan   1. Reduced activity in the prior lesion such as the right second and fifth  rib lesions, activity nearly completely resolved and significantly less than added mediastinal blood pool activity. No new lesions are identified. 2. Other imaging findings of potential clinical significance: Coronary, aortic arch, and branch vessel atherosclerotic vascular disease. Aortoiliac atherosclerotic vascular disease. Enlarged prostate gland. Colonic diverticula.   02/17/2017 PET scan   HEAD/NECK: No hypermetabolic activity in the scalp. No hypermetabolic cervical lymph nodes. CHEST: No hypermetabolic mediastinal or hilar nodes. Right upper lobe scarring/atelectasis. No suspicious pulmonary nodules on the CT scan. ABDOMEN/PELVIS: No abnormal hypermetabolic activity within the liver, pancreas, adrenal glands, or spleen. No hypermetabolic lymph nodes in the abdomen or pelvis. Atherosclerotic calcifications of the abdominal aorta and branch vessels. Left renal sinus cysts. Sigmoid diverticulosis, without evidence of diverticulitis. Prostatomegaly. SKELETON: Vague hypermetabolism involving the inferior sternum, max SUV 3.2, previously 8.2. Focal hypermetabolism involving the left sacrum, max SUV 4.0, previously 6.7. EXTREMITIES: No abnormal hypermetabolic activity in the lower extremities.   02/18/2017 -  Chemotherapy   Bortezomib 1.49m/m2 QWk + Dexamethasone 183mQTue/Fri --Cycle #1, 02/18/17    02/18/2017 - 08/26/2017 Chemotherapy   The patient had bortezomib SQ (VELCADE) chemo injection 3 mg, 1.3 mg/m2 =  3 mg, Subcutaneous,  Once, 7 of 9 cycles Administration: 3 mg (02/18/2017), 3 mg (02/26/2017), 3 mg (03/04/2017), 3 mg (03/11/2017), 3 mg (04/08/2017), 3 mg (03/18/2017), 3 mg (03/25/2017), 3 mg (04/01/2017), 3 mg (05/06/2017), 3 mg (05/20/2017), 3 mg (06/03/2017), 3 mg (06/17/2017), 3 mg (07/01/2017), 3 mg (07/15/2017), 3 mg (07/29/2017), 3 mg (08/12/2017), 3 mg (08/26/2017)  for chemotherapy treatment.    09/17/2017 - 10/02/2021 Chemotherapy   Patient is on Treatment Plan : MYELOMA Daratumumab q28d      09/17/2017 - 12/04/2021 Chemotherapy   Patient is on Treatment Plan : MYELOMA Daratumumab SQ q28d       ALLERGIES:  is allergic to codeine, morphine and related, and revlimid [lenalidomide].  MEDICATIONS:  Current Outpatient Medications  Medication Sig Dispense Refill   acyclovir (ZOVIRAX) 400 MG tablet Take 1 tablet (400 mg total) by mouth 2 (two) times daily. 60 tablet 6   ALPRAZolam (XANAX) 1 MG tablet Take 1 tablet (1 mg total) by mouth 3 (three) times daily as needed. for anxiety 90 tablet 0   amoxicillin (AMOXIL) 500 MG capsule Take 500 mg by mouth 2 (two) times daily.     apixaban (ELIQUIS) 5 MG TABS tablet TAKE TWO TABLETS BY MOUTH (10MG  TOTAL) TWICE A DAY. THEN TAKE ONE TABLET (5MG ) TWICE A DAY STARTING ON 02/19/22 60 tablet 1   CALCIUM-VITAMIN D PO Take 1 tablet by mouth 2 (two) times daily.      cyanocobalamin (VITAMIN B12) 500 MCG tablet Take 1 tablet (500 mcg total) by mouth daily.     dexamethasone (DECADRON) 4 MG tablet Take 1 tablet (4 mg total) by mouth daily. 30 tablet 0   fluticasone (FLONASE) 50 MCG/ACT nasal spray PLACE 2 SPRAYS INTO BOTH NOSTRILS DAILY 16 g 6   folic acid (FOLVITE) 1 MG tablet Take 1 tablet (1 mg total) by mouth daily.     furosemide (LASIX) 20 MG tablet TAKE 1 TABLET BY MOUTH ONCE A DAY AS NEEDED (Patient not taking: Reported on 02/20/2022) 30 tablet 3   gabapentin (NEURONTIN) 400 MG capsule Take 1 capsule (400 mg total) by mouth 3 (three) times daily. 90 capsule 4   Homeopathic Products (THERAWORX RELIEF EX) Apply topically as needed.     lidocaine-prilocaine (EMLA) cream Apply small amount over port one (1) hour prior to appointment. (Patient not taking: Reported on 03/21/2022) 30 g 0   montelukast (SINGULAIR) 10 MG tablet TAKE 1 TABLET BY MOUTH EVERY NIGHT AT BEDTIME 30 tablet 3   Oxycodone HCl 10 MG TABS TAKE ONE TO TWO TABLETS BY MOUTH THREE TIMES DAILY AS NEEDED 90 tablet 0   pomalidomide (POMALYST) 3 MG capsule Take 1 capsule (3 mg total) by mouth  daily. Take for 21 days, then hold for 7 days. Repeat every 28 days. 21 capsule 0   tamsulosin (FLOMAX) 0.4 MG CAPS capsule TAKE ONE CAPSULE BY MOUTH EVERY NIGHT ATBEDTIME 30 capsule 3   traZODone (DESYREL) 100 MG tablet TAKE 3 TABLETS BY MOUTH EVERY NIGHT AT BEDTIME 90 tablet 1   TURMERIC PO Take 1 tablet by mouth 2 (two) times daily.     No current facility-administered medications for this visit.   Facility-Administered Medications Ordered in Other Visits  Medication Dose Route Frequency Provider Last Rate Last Admin   heparin lock flush 100 unit/mL  500 Units Intravenous Once Lloyd Huger, MD        VITAL SIGNS: There were no vitals taken for this visit. There were no vitals  filed for this visit.  Estimated body mass index is 25.19 kg/m as calculated from the following:   Height as of an earlier encounter on 04/21/22: 5\' 8"  (1.727 m).   Weight as of 03/20/22: 165 lb 11.2 oz (75.2 kg).  LABS: CBC:    Component Value Date/Time   WBC 4.9 04/21/2022 1020   HGB 11.6 (L) 04/21/2022 1020   HCT 37.1 (L) 04/21/2022 1020   PLT 191 04/21/2022 1020   MCV 97.9 04/21/2022 1020   NEUTROABS 2.1 04/21/2022 1020   LYMPHSABS 1.6 04/21/2022 1020   MONOABS 1.2 (H) 04/21/2022 1020   EOSABS 0.0 04/21/2022 1020   BASOSABS 0.1 04/21/2022 1020   Comprehensive Metabolic Panel:    Component Value Date/Time   NA 138 04/21/2022 1020   K 3.7 04/21/2022 1020   CL 105 04/21/2022 1020   CO2 28 04/21/2022 1020   BUN 26 (H) 04/21/2022 1020   CREATININE 0.64 04/21/2022 1020   CREATININE 0.51 (L) 03/20/2022 0914   CREATININE 0.81 03/26/2021 1117   GLUCOSE 88 04/21/2022 1020   CALCIUM 8.3 (L) 04/21/2022 1020   AST 17 04/21/2022 1020   AST 18 03/20/2022 0914   ALT 12 04/21/2022 1020   ALT 13 03/20/2022 0914   ALKPHOS 30 (L) 04/21/2022 1020   BILITOT 0.5 04/21/2022 1020   BILITOT 0.4 03/20/2022 0914   PROT 6.7 04/21/2022 1020   ALBUMIN 3.0 (L) 04/21/2022 1020    RADIOGRAPHIC STUDIES: DG  Outside Films Extremity  Result Date: 03/24/2022 This examination belongs to an outside facility and is stored here for comparison purposes only.  Contact the originating outside institution for any associated report or interpretation.  DG Outside Films Extremity  Result Date: 03/24/2022 This examination belongs to an outside facility and is stored here for comparison purposes only.  Contact the originating outside institution for any associated report or interpretation.  DG Outside Films Extremity  Result Date: 03/24/2022 This examination belongs to an outside facility and is stored here for comparison purposes only.  Contact the originating outside institution for any associated report or interpretation.  DG Outside Films Extremity  Result Date: 03/24/2022 This examination belongs to an outside facility and is stored here for comparison purposes only.  Contact the originating outside institution for any associated report or interpretation.   PERFORMANCE STATUS (ECOG) : 3 - Symptomatic, >50% confined to bed  Review of Systems Unless otherwise noted, a complete review of systems is negative.  Physical Exam General: NAD Pulmonary: Unlabored Extremities: no edema, no joint deformities, left arm in sling Skin: no rashes Neurological: Weakness but otherwise nonfocal  IMPRESSION: Follow-up visit with patient and daughter.  Patient reports that he feels fatigued and weak on Pomalyst.  However, he is working with home health physical therapy and says he is slowly improving in regards to functioning.  Daughter asks about extension of home health and encouraged her to discuss with patient's home health team.  Symptomatically, patient endorses pain in the hip.  He is taking oxycodone which helps.  However, he is only taking it once or twice a day.  I encouraged him to take it more frequently as needed.  He had some concerns about opioid-induced constipation and we discussed adjustment to his  bowel regimen as needed.  PLAN: -Continue current scope of treatment -DNR/DNI -Continue oxycodone as needed -Follow-up telephone visit 1 to 2 months   Patient expressed understanding and was in agreement with this plan. He also understands that He can call clinic at  any time with any questions, concerns, or complaints.   Thank you for allowing me to participate in the care of this very pleasant patient.   Time Total: 20 minutes  Visit consisted of counseling and education dealing with the complex and emotionally intense issues of symptom management in the setting of serious illness.Greater than 50%  of this time was spent counseling and coordinating care related to the above assessment and plan.  Signed by: Altha Harm, PhD, NP-C

## 2022-04-21 NOTE — Progress Notes (Signed)
[place]  Telephone:([phone] [phone]  Patient Care Team: [name], MD as PCP - General (Family Medicine) Gery Pray, MD as Consulting Physician (Radiation Oncology) Danie Binder, MD (Inactive) as Consulting Physician (Gastroenterology) Michael Boston, MD as Consulting Physician (General Surgery) Lloyd Huger, MD as Consulting Physician (Oncology)   Name of the patient: Dean [name]  CR:[id number]  28-Jun-1935   Date of visit: 04/21/22  HPI: Patient is a 87 y.o. male with multiple myeloma, currently treated with pomalidomide.  Reason for Consult: Oral chemotherapy follow-up for pomalidomide therapy.   PAST MEDICAL HISTORY: Past Medical History:  Diagnosis Date   BPH (benign prostatic hyperplasia)    Colonic diverticular abscess 01/08/2015   Diverticulitis 123456   complicated by abscess and required percutaneous drainage   GERD (gastroesophageal reflux disease)    H/O ETOH abuse    History of chemotherapy last done jan 2017   History of radiation therapy 01/05/13-02/10/13   45 gray to left occipital condyle region   History of radiation therapy 12/01/16-12/10/16   Parasternal nodule, chest- 24 Gy total delivered in 8 fractions, Left sacro-iliac, pelvis- 24 Gy total delivered in 8 fractions    History of radiation therapy 02/19/17-02/22/17   right temporal scalp 30 Gy in 10 fractions   History of radiation therapy    Right hip( Pelvis) 11/18/21-11/21/21-Dr. Gery Pray   Intra-abdominal abscess [facility])    Multiple myeloma ([place]) 2015   Neuropathy    Radiation 02/21/14-03/08/14   right posterior chest wall area 30 gray   Radiation 04/19/14-05/02/14   lumbar spine 25 gray   Skull lesion    Left occipital condyle   Wrist fracture, left    x 2    HEMATOLOGY/ONCOLOGY HISTORY:  Oncology History  Multiple myeloma without remission ([place])  11/16/2012 Initial Diagnosis   L occipital condyle destructive  lesion, not biopsied secondary to location. XRT   12/01/2012 Imaging   L3 superior endplate compression FX, no lytic lesions to suggest myeloma   02/06/2014 Imaging   soft tissue mass along the right posterior fifth rib with some rib destruction   02/16/2014 Miscellaneous   Normal CBC, Normal CMP, kappa,lamda ratio of 2.62 (abnormal), UIEP with slightly restricted band, IgG kappa, SPEP/IEP with monoclonal protein at 0.79 g/dl, normal igG, suppressed IGM   02/20/2014 Bone Marrow Biopsy   33% kappa restricted plasma cells Normal FISH, normal cytogenetics   02/21/2014 - 03/08/2014 Radiation Therapy   30Gy to R rib lesion, lesion not biopsied   03/16/2014 PET scan   Scattered hypermetabolic osseous lesions, including the calvarium, ribs, left scapula, sacrum, and left femoral shaft. An expansile left vertebral body lesion at L3 extends into the epidural space of the left lateral recess,    03/17/2014 - 04/07/2014 Chemotherapy   Velcade and Dexamethasone due to initial denial of Revlimid by insurance company.  Zometa monthly.   03/22/2014 Imaging   MR_ L-Spine- Enhancing lesions compatible with multiple myeloma at L2, L3, and S1-2.   04/07/2014 - 07/28/2014 Chemotherapy   RVD with Revlimid at 25 mg days 1-14.  Revlimid dose reduced to 25 mg every other day x 14 days with 7 day respite beginning on day 1 of cycle 2.   04/17/2014 Adverse Reaction   Revlimid-induced rash.  Treated with steroids.   07/28/2014 - 08/04/2014 Chemotherapy   Revlimid daily   08/04/2014 Adverse Reaction   Velcade-induced peripheral neuropathy   08/04/2014 Treatment Plan Change   D/C Velcade  08/11/2014 PET scan   Response to therapy. Improvement and resolution of foci of osseous hypermetabolism.   08/22/2014 - 08/28/2014 Chemotherapy   Revlimid 25 mg days 1-21 every 28 days   08/28/2014 Adverse Reaction   Revlimid-induced rash. Medication held.  Medrol dose Pak prescribed.   09/04/2014 - 01/08/2015 Chemotherapy   Revlimid 10  mg every other day, without dexamethasone   01/08/2015 - 01/15/2015 Hospital Admission   Colonic diverticular abscess with IR drain placement   03/02/2015 PET scan   Only bony uptake is low activity at site of deformity/callus at R second rib FX, high activity in sigmoid colon, advanced sigmoid divertidulosis. Abscess not resolved?   06/04/2015 Imaging   CT nonobstructive L renal calculus, diverticulosis of descending and sigmoid colon without inflammation, moderate prostatic enlargement   07/12/2015 Surgery   Diverticulitis s/p robotic sigmoid colectomy with Dr. Johney Maine   08/23/2015 PET scan   Primarily similar hypermetabolic osseous foci within the R sided ribs. new L third rib focus of hypermetab and sclerosis is favored to be related to healing FX. no soft tissue myeloma id'd. Presumed postop hypermetab and edema about the R pelvic wall    03/06/2016 PET scan   1. Reduced activity in the prior lesion such as the right second and fifth rib lesions, activity nearly completely resolved and significantly less than added mediastinal blood pool activity. No new lesions are identified. 2. Other imaging findings of potential clinical significance: Coronary, aortic arch, and branch vessel atherosclerotic vascular disease. Aortoiliac atherosclerotic vascular disease. Enlarged prostate gland. Colonic diverticula.   02/17/2017 PET scan   HEAD/NECK: No hypermetabolic activity in the scalp. No hypermetabolic cervical lymph nodes. CHEST: No hypermetabolic mediastinal or hilar nodes. Right upper lobe scarring/atelectasis. No suspicious pulmonary nodules on the CT scan. ABDOMEN/PELVIS: No abnormal hypermetabolic activity within the liver, pancreas, adrenal glands, or spleen. No hypermetabolic lymph nodes in the abdomen or pelvis. Atherosclerotic calcifications of the abdominal aorta and branch vessels. Left renal sinus cysts. Sigmoid diverticulosis, without evidence of diverticulitis. Prostatomegaly. SKELETON:  Vague hypermetabolism involving the inferior sternum, max SUV 3.2, previously 8.2. Focal hypermetabolism involving the left sacrum, max SUV 4.0, previously 6.7. EXTREMITIES: No abnormal hypermetabolic activity in the lower extremities.   02/18/2017 -  Chemotherapy   Bortezomib 1.3mg /m2 QWk + Dexamethasone 10mg  QTue/Fri --Cycle #1, 02/18/17    02/18/2017 - 08/26/2017 Chemotherapy   The patient had bortezomib SQ (VELCADE) chemo injection 3 mg, 1.3 mg/m2 = 3 mg, Subcutaneous,  Once, 7 of 9 cycles Administration: 3 mg (02/18/2017), 3 mg (02/26/2017), 3 mg (03/04/2017), 3 mg (03/11/2017), 3 mg (04/08/2017), 3 mg (03/18/2017), 3 mg (03/25/2017), 3 mg (04/01/2017), 3 mg (05/06/2017), 3 mg (05/20/2017), 3 mg (06/03/2017), 3 mg (06/17/2017), 3 mg (07/01/2017), 3 mg (07/15/2017), 3 mg (07/29/2017), 3 mg (08/12/2017), 3 mg (08/26/2017)  for chemotherapy treatment.    09/17/2017 - 10/02/2021 Chemotherapy   Patient is on Treatment Plan : MYELOMA Daratumumab q28d     09/17/2017 - 12/04/2021 Chemotherapy   Patient is on Treatment Plan : MYELOMA Daratumumab SQ q28d       ALLERGIES:  is allergic to codeine, morphine and related, and revlimid [lenalidomide].  MEDICATIONS:  Current Outpatient Medications  Medication Sig Dispense Refill   acyclovir (ZOVIRAX) 400 MG tablet Take 1 tablet (400 mg total) by mouth 2 (two) times daily. 60 tablet 6   ALPRAZolam (XANAX) 1 MG tablet Take 1 tablet (1 mg total) by mouth 3 (three) times daily as needed. for anxiety 90 tablet 0  amoxicillin (AMOXIL) 500 MG capsule Take 500 mg by mouth 2 (two) times daily.     apixaban (ELIQUIS) 5 MG TABS tablet TAKE TWO TABLETS BY MOUTH (10MG  TOTAL) TWICE A DAY. THEN TAKE ONE TABLET (5MG ) TWICE A DAY STARTING ON 02/19/22 60 tablet 1   CALCIUM-VITAMIN D PO Take 1 tablet by mouth 2 (two) times daily.      cyanocobalamin (VITAMIN B12) 500 MCG tablet Take 1 tablet (500 mcg total) by mouth daily.     dexamethasone (DECADRON) 4 MG tablet Take 1 tablet (4 mg total)  by mouth daily. 30 tablet 0   fluticasone (FLONASE) 50 MCG/ACT nasal spray PLACE 2 SPRAYS INTO BOTH NOSTRILS DAILY 16 g 6   folic acid (FOLVITE) 1 MG tablet Take 1 tablet (1 mg total) by mouth daily.     furosemide (LASIX) 20 MG tablet TAKE 1 TABLET BY MOUTH ONCE A DAY AS NEEDED (Patient not taking: Reported on 02/20/2022) 30 tablet 3   gabapentin (NEURONTIN) 400 MG capsule Take 1 capsule (400 mg total) by mouth 3 (three) times daily. 90 capsule 4   Homeopathic Products (THERAWORX RELIEF EX) Apply topically as needed.     lidocaine-prilocaine (EMLA) cream Apply small amount over port one (1) hour prior to appointment. (Patient not taking: Reported on 03/21/2022) 30 g 0   montelukast (SINGULAIR) 10 MG tablet TAKE 1 TABLET BY MOUTH EVERY NIGHT AT BEDTIME 30 tablet 3   Oxycodone HCl 10 MG TABS TAKE ONE TO TWO TABLETS BY MOUTH THREE TIMES DAILY AS NEEDED 90 tablet 0   pomalidomide (POMALYST) 3 MG capsule Take 1 capsule (3 mg total) by mouth daily. Take for 21 days, then hold for 7 days. Repeat every 28 days. 21 capsule 0   tamsulosin (FLOMAX) 0.4 MG CAPS capsule TAKE ONE CAPSULE BY MOUTH EVERY NIGHT ATBEDTIME 30 capsule 3   traZODone (DESYREL) 100 MG tablet TAKE 3 TABLETS BY MOUTH EVERY NIGHT AT BEDTIME 90 tablet 1   TURMERIC PO Take 1 tablet by mouth 2 (two) times daily.     No current facility-administered medications for this visit.   Facility-Administered Medications Ordered in Other Visits  Medication Dose Route Frequency Provider Last Rate Last Admin   heparin lock flush 100 unit/mL  500 Units Intravenous Once Lloyd Huger, MD        VITAL SIGNS: There were no vitals taken for this visit. There were no vitals filed for this visit.  Estimated body mass index is 25.19 kg/m as calculated from the following:   Height as of an earlier encounter on 04/21/22: 5\' 8"  (1.727 m).   Weight as of 03/20/22: 75.2 kg (165 lb 11.2 oz).  LABS: CBC:    Component Value Date/Time   WBC 4.9  04/21/2022 1020   HGB 11.6 (L) 04/21/2022 1020   HCT 37.1 (L) 04/21/2022 1020   PLT 191 04/21/2022 1020   MCV 97.9 04/21/2022 1020   NEUTROABS 2.1 04/21/2022 1020   LYMPHSABS 1.6 04/21/2022 1020   MONOABS 1.2 (H) 04/21/2022 1020   EOSABS 0.0 04/21/2022 1020   BASOSABS 0.1 04/21/2022 1020   Comprehensive Metabolic Panel:    Component Value Date/Time   NA 138 04/21/2022 1020   K 3.7 04/21/2022 1020   CL 105 04/21/2022 1020   CO2 28 04/21/2022 1020   BUN 26 (H) 04/21/2022 1020   CREATININE 0.64 04/21/2022 1020   CREATININE 0.51 (L) 03/20/2022 0914   CREATININE 0.81 03/26/2021 1117   GLUCOSE 88 04/21/2022 1020  CALCIUM 8.3 (L) 04/21/2022 1020   AST 17 04/21/2022 1020   AST 18 03/20/2022 0914   ALT 12 04/21/2022 1020   ALT 13 03/20/2022 0914   ALKPHOS 30 (L) 04/21/2022 1020   BILITOT 0.5 04/21/2022 1020   BILITOT 0.4 03/20/2022 0914   PROT 6.7 04/21/2022 1020   ALBUMIN 3.0 (L) 04/21/2022 1020     Present during today's visit: patient and his daughter  Assessment and Plan: Continue pomalidomide 3mg , 21 on/7 off   Oral Chemotherapy Side Effect/Intolerance:  Edema: slight edema in both ankles, per patient today is it better than it has been. Patient manages edema by elevating his feet when possible Bowel movements: normal with the use of loperamide  Oral Chemotherapy Adherence: no missed doses reported No patient barriers to medication adherence identified.   New medications: none reported  Medication Access Issues: No issues, patient fills med at [facility]  Patient expressed understanding and was in agreement with this plan. He also understands that He can call clinic at any time with any questions, concerns, or complaints.   Follow-up plan: RTC in 4 weeks  Thank you for allowing me to participate in the care of this very pleasant patient.   Time Total: 10 mins  Visit consisted of counseling and education on dealing with issues of symptom management in  the setting of serious and potentially life-threatening illness.Greater than 50%  of this time was spent counseling and coordinating care related to the above assessment and plan.  Signed by: Darl Pikes, PharmD, BCPS, Salley Slaughter, CPP Hematology/Oncology Clinical Pharmacist Practitioner [employer]/[facility] [phone]  04/21/2022 2:25 PM

## 2022-04-21 NOTE — Progress Notes (Signed)
[place]  Telephone:([phone] [phone]  ID: Dean [name]. OB: 06/15/1935  MR#: [id number]  [id number]  Patient Care Team: [name], MD as PCP - General (Family Medicine) Gery Pray, MD as Consulting Physician (Radiation Oncology) Danie Binder, MD (Inactive) as Consulting Physician (Gastroenterology) Michael Boston, MD as Consulting Physician (General Surgery) Lloyd Huger, MD as Consulting Physician (Oncology)  CHIEF COMPLAINT: Multiple myeloma  INTERVAL HISTORY: Patient returns to clinic today for repeat laboratory work, further evaluation, and continuation of Pomalyst.  He complains of intermittent lower extremity edema.  He continues to have chronic weakness and fatigue.  He has had no recent falls.  He denies any fevers.  He continues to have bilateral hip pain.  He has no neurologic complaints.  He denies any fevers.  He has a poor appetite.  He has no chest pain, shortness of breath, cough, or hemoptysis.  He denies any nausea, vomiting, constipation, or diarrhea.  He has no urinary complaints.  Patient offers no further specific complaints today.  REVIEW OF SYSTEMS:   Review of Systems  Constitutional:  Positive for malaise/fatigue. Negative for fever and weight loss.  Respiratory: Negative.  Negative for cough, hemoptysis and shortness of breath.   Cardiovascular:  Positive for leg swelling. Negative for chest pain.  Gastrointestinal: Negative.  Negative for abdominal pain and nausea.  Genitourinary:  Positive for flank pain. Negative for dysuria.  Musculoskeletal:  Positive for back pain and joint pain. Negative for falls.  Skin: Negative.  Negative for rash.  Neurological:  Positive for sensory change and weakness. Negative for dizziness, focal weakness and headaches.  Psychiatric/Behavioral: Negative.  The patient is not nervous/anxious and does not have insomnia.     As per HPI. Otherwise, a complete review of  systems is negative.  PAST MEDICAL HISTORY: Past Medical History:  Diagnosis Date   BPH (benign prostatic hyperplasia)    Colonic diverticular abscess 01/08/2015   Diverticulitis 123456   complicated by abscess and required percutaneous drainage   GERD (gastroesophageal reflux disease)    H/O ETOH abuse    History of chemotherapy last done jan 2017   History of radiation therapy 01/05/13-02/10/13   45 gray to left occipital condyle region   History of radiation therapy 12/01/16-12/10/16   Parasternal nodule, chest- 24 Gy total delivered in 8 fractions, Left sacro-iliac, pelvis- 24 Gy total delivered in 8 fractions    History of radiation therapy 02/19/17-02/22/17   right temporal scalp 30 Gy in 10 fractions   History of radiation therapy    Right hip( Pelvis) 11/18/21-11/21/21-Dr. Gery Pray   Intra-abdominal abscess ([place])    Multiple myeloma ([place]) 2015   Neuropathy    Radiation 02/21/14-03/08/14   right posterior chest wall area 30 gray   Radiation 04/19/14-05/02/14   lumbar spine 25 gray   Skull lesion    Left occipital condyle   Wrist fracture, left    x 2    PAST SURGICAL HISTORY: Past Surgical History:  Procedure Laterality Date   COLONOSCOPY N/A 04/19/2015   Procedure: COLONOSCOPY;  Surgeon: Danie Binder, MD;  Location: [facility];  Service: Endoscopy;  Laterality: N/A;  1:30 PM   CYST EXCISION  1959   tail bone   IR IMAGING GUIDED PORT INSERTION  09/25/2017    FAMILY HISTORY: Family History  Problem Relation Age of Onset   Diabetes Mother    Stroke Mother    Hypertension Father     ADVANCED  DIRECTIVES (Y/N):  N  HEALTH MAINTENANCE: Social History   Tobacco Use   Smoking status: Never   Smokeless tobacco: Never  Vaping Use   Vaping Use: Never used  Substance Use Topics   Alcohol use: No    Comment: " Not much no more"   Drug use: No     Colonoscopy:  PAP:  Bone density:  Lipid panel:  Allergies  Allergen Reactions   Codeine Other (See  Comments)    Headache   Morphine And Related Other (See Comments)    Makes him feel weird   Revlimid [Lenalidomide] Other (See Comments)    "Causes me to become weak"    Current Outpatient Medications  Medication Sig Dispense Refill   amoxicillin (AMOXIL) 500 MG capsule Take 500 mg by mouth 2 (two) times daily.     CALCIUM-VITAMIN D PO Take 1 tablet by mouth 2 (two) times daily.      cyanocobalamin (VITAMIN B12) 500 MCG tablet Take 1 tablet (500 mcg total) by mouth daily.     fluticasone (FLONASE) 50 MCG/ACT nasal spray PLACE 2 SPRAYS INTO BOTH NOSTRILS DAILY 16 g 6   folic acid (FOLVITE) 1 MG tablet Take 1 tablet (1 mg total) by mouth daily.     Homeopathic Products ([place]) Apply topically as needed.     montelukast (SINGULAIR) 10 MG tablet TAKE 1 TABLET BY MOUTH EVERY NIGHT AT BEDTIME 30 tablet 3   pomalidomide (POMALYST) 3 MG capsule Take 1 capsule (3 mg total) by mouth daily. Take for 21 days, then hold for 7 days. Repeat every 28 days. 21 capsule 0   tamsulosin (FLOMAX) 0.4 MG CAPS capsule TAKE ONE CAPSULE BY MOUTH EVERY NIGHT ATBEDTIME 30 capsule 3   traZODone (DESYREL) 100 MG tablet TAKE 3 TABLETS BY MOUTH EVERY NIGHT AT BEDTIME 90 tablet 1   TURMERIC PO Take 1 tablet by mouth 2 (two) times daily.     acyclovir (ZOVIRAX) 400 MG tablet Take 1 tablet (400 mg total) by mouth 2 (two) times daily. 60 tablet 6   ALPRAZolam (XANAX) 1 MG tablet Take 1 tablet (1 mg total) by mouth 3 (three) times daily as needed. for anxiety 90 tablet 0   apixaban (ELIQUIS) 5 MG TABS tablet TAKE TWO TABLETS BY MOUTH (10MG  TOTAL) TWICE A DAY. THEN TAKE ONE TABLET (5MG ) TWICE A DAY STARTING ON 02/19/22 60 tablet 1   dexamethasone (DECADRON) 4 MG tablet Take 1 tablet (4 mg total) by mouth daily. 30 tablet 0   furosemide (LASIX) 20 MG tablet TAKE 1 TABLET BY MOUTH ONCE A DAY AS NEEDED (Patient not taking: Reported on 02/20/2022) 30 tablet 3   gabapentin (NEURONTIN) 400 MG capsule Take 1 capsule (400  mg total) by mouth 3 (three) times daily. 90 capsule 4   lidocaine-prilocaine (EMLA) cream Apply small amount over port one (1) hour prior to appointment. (Patient not taking: Reported on 03/21/2022) 30 g 0   Oxycodone HCl 10 MG TABS TAKE ONE TO TWO TABLETS BY MOUTH THREE TIMES DAILY AS NEEDED 90 tablet 0   No current facility-administered medications for this visit.   Facility-Administered Medications Ordered in Other Visits  Medication Dose Route Frequency Provider Last Rate Last Admin   heparin lock flush 100 unit/mL  500 Units Intravenous Once Lloyd Huger, MD        OBJECTIVE: Vitals:   04/21/22 1056  BP: 126/62  Pulse: (!) 58  Resp: 18  Temp: (!) 96.4 F (35.8  C)  SpO2: 99%      Body mass index is 25.19 kg/m.    ECOG FS:2 - Symptomatic, <50% confined to bed  General: Well-developed, well-nourished, no acute distress.  Sitting in a wheelchair. Eyes: Pink conjunctiva, anicteric sclera. HEENT: Normocephalic, moist mucous membranes. Lungs: No audible wheezing or coughing. Heart: Regular rate and rhythm. Abdomen: Soft, nontender, no obvious distention. Musculoskeletal: No edema, cyanosis, or clubbing. Neuro: Alert, answering all questions appropriately. Cranial nerves grossly intact. Skin: No rashes or petechiae noted. Psych: Normal affect.  LAB RESULTS:  Lab Results  Component Value Date   NA 138 04/21/2022   K 3.7 04/21/2022   CL 105 04/21/2022   CO2 28 04/21/2022   GLUCOSE 88 04/21/2022   BUN 26 (H) 04/21/2022   CREATININE 0.64 04/21/2022   CALCIUM 8.3 (L) 04/21/2022   PROT 6.7 04/21/2022   ALBUMIN 3.0 (L) 04/21/2022   AST 17 04/21/2022   ALT 12 04/21/2022   ALKPHOS 30 (L) 04/21/2022   BILITOT 0.5 04/21/2022   GFRNONAA >60 04/21/2022   GFRAA >60 10/13/2019    Lab Results  Component Value Date   WBC 4.9 04/21/2022   NEUTROABS 2.1 04/21/2022   HGB 11.6 (L) 04/21/2022   HCT 37.1 (L) 04/21/2022   MCV 97.9 04/21/2022   PLT 191 04/21/2022      STUDIES: DG Outside Films Extremity  Result Date: 03/24/2022 This examination belongs to an outside facility and is stored here for comparison purposes only.  Contact the originating outside institution for any associated report or interpretation.  DG Outside Films Extremity  Result Date: 03/24/2022 This examination belongs to an outside facility and is stored here for comparison purposes only.  Contact the originating outside institution for any associated report or interpretation.  DG Outside Films Extremity  Result Date: 03/24/2022 This examination belongs to an outside facility and is stored here for comparison purposes only.  Contact the originating outside institution for any associated report or interpretation.  DG Outside Films Extremity  Result Date: 03/24/2022 This examination belongs to an outside facility and is stored here for comparison purposes only.  Contact the originating outside institution for any associated report or interpretation.   ASSESSMENT: Multiple myeloma  PLAN:  Multiple myeloma: Please see note from September 17, 2020 for historic details of his myeloma and treatment.  Since reinitiating daratumumab, patient's M spike trended down to 3.6, but recently has increased back to 5.3.  Patient was subsequently placed on Pomalyst and his M spike has now trended down to 1.5.  IgG and kappa free light chains also continue to trend down.  We previously discontinuing treatment and hospice, but patient wishes to pursue Pomalyst.  He expressed understanding that no other treatments would be available if Pomalyst were no longer working.  Proceed with dose reduced Pomalyst 3 mg daily for 21 days with 7 days off.  Return to clinic in 4 weeks for further evaluation and continuation of treatment.  Will reintroduce Zometa at next clinic visit.  Appreciate clinical pharmacy and palliative care input.  Thrombocytopenia: Resolved. Back/joint pain: Chronic and unchanged.  MRI  results reviewed independently consistent with progressive myeloma.  No pathologic fractures reported.  Patient has completed palliative XRT.  Continue current narcotics as prescribed.    Peripheral neuropathy: Chronic and unchanged.  Continue gabapentin as prescribed. Insomnia/anxiety: Chronic and unchanged.  Patient is now having his medications adjusted by primary care.   Fractured left clavicle:  Follow-up with orthopedics as indicated. Anemia: Hemoglobin has trended  to 11.6. Hypocalcemia: Given patient's low albumin this corrects to near normal.   Pulmonary embolism: Patient is now on Eliquis. Pia: Resolved.  Patient also confirms DNR/DNI.   Patient expressed understanding and was in agreement with this plan. He also understands that He can call clinic at any time with any questions, concerns, or complaints.    Cancer Staging  No matching staging information was found for the patient.  Lloyd Huger, MD   04/21/2022 1:56 PM

## 2022-04-21 NOTE — Progress Notes (Signed)
[facility] CSW Progress Note  Clinical Education officer, museum contacted caregiver by phone to discuss concerns over bed bugs observed on patient.  CSW spoke with patient's daughter Dean [name], and updated her on the concerns. Ms. Dean [name] stated the patient , herself and her spouse are currently using over the counter products to assist with removing bed bugs.  Ms. Dean [name] started the patient would like to try ne more time before calling the exterminator due to expense.  Ms. Dean [name] stated there has been a significant improvement in the amount of bed bugs and they are actively assisting the patient with daily clean-up.  CSW stated we would update Ms. Dean [name] if there are any more sightings of bed bugs.  Ms. Dean [name] verbalized understanding.    Adelene Amas, LCSW    Patient is participating in a Managed Medicaid Plan:  Yes

## 2022-04-22 ENCOUNTER — Other Ambulatory Visit: Payer: Self-pay | Admitting: *Deleted

## 2022-04-22 LAB — KAPPA/LAMBDA LIGHT CHAINS
Kappa free light chain: 76.7 mg/L — ABNORMAL HIGH (ref 3.3–19.4)
Kappa, lambda light chain ratio: 8.52 — ABNORMAL HIGH (ref 0.26–1.65)
Lambda free light chains: 9 mg/L (ref 5.7–26.3)

## 2022-04-22 LAB — IGG, IGA, IGM
IgA: 46 mg/dL — ABNORMAL LOW (ref 61–437)
IgG (Immunoglobin G), Serum: 1880 mg/dL — ABNORMAL HIGH (ref 603–1613)
IgM (Immunoglobulin M), Srm: 19 mg/dL (ref 15–143)

## 2022-04-22 MED ORDER — FUROSEMIDE 20 MG PO TABS: 20.0000 mg | ORAL_TABLET | Freq: Every day | ORAL | 1 refills | Status: DC | PRN

## 2022-04-24 LAB — PROTEIN ELECTROPHORESIS, SERUM
A/G Ratio: 0.9 (ref 0.7–1.7)
Albumin ELP: 2.8 g/dL — ABNORMAL LOW (ref 2.9–4.4)
Alpha-1-Globulin: 0.3 g/dL (ref 0.0–0.4)
Alpha-2-Globulin: 0.7 g/dL (ref 0.4–1.0)
Beta Globulin: 0.7 g/dL (ref 0.7–1.3)
Gamma Globulin: 1.5 g/dL (ref 0.4–1.8)
Globulin, Total: 3.2 g/dL (ref 2.2–3.9)
M-Spike, %: 1.3 g/dL — ABNORMAL HIGH
Total Protein ELP: 6 g/dL (ref 6.0–8.5)

## 2022-04-28 ENCOUNTER — Ambulatory Visit: Payer: Medicare Other | Admitting: Oncology

## 2022-04-28 ENCOUNTER — Encounter: Payer: Medicare Other | Admitting: Hospice and Palliative Medicine

## 2022-04-28 ENCOUNTER — Other Ambulatory Visit: Payer: Medicare Other

## 2022-04-28 ENCOUNTER — Ambulatory Visit: Payer: Medicare Other | Admitting: Pharmacist

## 2022-05-01 ENCOUNTER — Other Ambulatory Visit: Payer: Medicare Other

## 2022-05-01 ENCOUNTER — Encounter (HOSPITAL_COMMUNITY): Payer: Self-pay | Admitting: Oncology

## 2022-05-01 DIAGNOSIS — Z515 Encounter for palliative care: Secondary | ICD-10-CM

## 2022-05-01 NOTE — Progress Notes (Signed)
PATIENT NAME: Dean [name]. DOB: 02/01/1936 MRN: [id number]  PRIMARY CARE PROVIDER: [name], MD  RESPONSIBLE PARTY:  Acct ID - Guarantor Home Phone Work Phone Relationship Acct Type  [id number] LAVERL, [phone]  Self P/F     [address], [place], [place] [address]   I connected with Barbara-daughter for  Jordache Mcelravy. on 05/01/22 by telephone and verified that I am speaking with the correct person using two identifiers.   I discussed the limitations of evaluation and management by telemedicine. The patient expressed understanding and agreed to proceed.   Connected with [name] to follow up on patient status, provide an update on changes in the palliative care program and offer a home visit.  [name] states patient has been doing fairly well.  No symptom management needs at this time.  [name] shared her brother passed away on 05/30/22 and they are in the mist of funeral arrangements. Condolences offered.  Gardiner Rhyme I would reach out in the next couple of weeks to work on scheduling a home visit with them.       Lorenza Burton, RN

## 2022-05-08 ENCOUNTER — Telehealth: Payer: Self-pay

## 2022-05-08 ENCOUNTER — Other Ambulatory Visit: Payer: Self-pay | Admitting: Radiation Oncology

## 2022-05-08 DIAGNOSIS — C9000 Multiple myeloma not having achieved remission: Secondary | ICD-10-CM

## 2022-05-08 NOTE — Telephone Encounter (Signed)
Received VM from patient's daughter Marliss Czar. Leigh calling on patient's behalf. Patient would like to receive radiation to left hip from Dr. Sondra Come. Will pass request along to provider for guidance

## 2022-05-12 ENCOUNTER — Other Ambulatory Visit: Payer: Self-pay

## 2022-05-12 DIAGNOSIS — C9000 Multiple myeloma not having achieved remission: Secondary | ICD-10-CM

## 2022-05-12 MED ORDER — POMALIDOMIDE 3 MG PO CAPS: 3.0000 mg | ORAL_CAPSULE | Freq: Every day | ORAL | 0 refills | Status: DC

## 2022-05-14 ENCOUNTER — Encounter (HOSPITAL_COMMUNITY): Payer: Self-pay | Admitting: Oncology

## 2022-05-16 ENCOUNTER — Other Ambulatory Visit: Payer: Self-pay | Admitting: Oncology

## 2022-05-16 DIAGNOSIS — C9000 Multiple myeloma not having achieved remission: Secondary | ICD-10-CM

## 2022-05-19 ENCOUNTER — Inpatient Hospital Stay: Payer: Medicare Other | Attending: Oncology

## 2022-05-19 ENCOUNTER — Encounter: Payer: Self-pay | Admitting: Oncology

## 2022-05-19 ENCOUNTER — Inpatient Hospital Stay (HOSPITAL_BASED_OUTPATIENT_CLINIC_OR_DEPARTMENT_OTHER): Payer: Medicare Other | Admitting: Oncology

## 2022-05-19 ENCOUNTER — Inpatient Hospital Stay: Payer: Medicare Other

## 2022-05-19 VITALS — BP 142/70 | HR 57 | Temp 96.9°F | Resp 18 | Wt 167.8 lb

## 2022-05-19 DIAGNOSIS — C9000 Multiple myeloma not having achieved remission: Secondary | ICD-10-CM

## 2022-05-19 LAB — COMPREHENSIVE METABOLIC PANEL
ALT: 16 U/L (ref 0–44)
AST: 16 U/L (ref 15–41)
Albumin: 2.9 g/dL — ABNORMAL LOW (ref 3.5–5.0)
Alkaline Phosphatase: 25 U/L — ABNORMAL LOW (ref 38–126)
Anion gap: 4 — ABNORMAL LOW (ref 5–15)
BUN: 29 mg/dL — ABNORMAL HIGH (ref 8–23)
CO2: 30 mmol/L (ref 22–32)
Calcium: 8.3 mg/dL — ABNORMAL LOW (ref 8.9–10.3)
Chloride: 106 mmol/L (ref 98–111)
Creatinine, Ser: 0.69 mg/dL (ref 0.61–1.24)
GFR, Estimated: >60 mL/min (ref >60)
Glucose, Bld: 96 mg/dL (ref 70–99)
Potassium: 4 mmol/L (ref 3.5–5.1)
Sodium: 140 mmol/L (ref 135–145)
Total Bilirubin: 0.4 mg/dL (ref 0.3–1.2)
Total Protein: 7 g/dL (ref 6.5–8.1)

## 2022-05-19 LAB — CBC WITH DIFFERENTIAL/PLATELET
Abs Immature Granulocytes: 0.05 10*3/uL (ref 0.00–0.07)
Basophils Absolute: 0 10*3/uL (ref 0.0–0.1)
Basophils Relative: 1 %
Eosinophils Absolute: 0 10*3/uL (ref 0.0–0.5)
Eosinophils Relative: 0 %
HCT: 39.9 % (ref 39.0–52.0)
Hemoglobin: 12.5 g/dL — ABNORMAL LOW (ref 13.0–17.0)
Immature Granulocytes: 1 %
Lymphocytes Relative: 26 %
Lymphs Abs: 1.4 10*3/uL (ref 0.7–4.0)
MCH: 31.1 pg (ref 26.0–34.0)
MCHC: 31.3 g/dL (ref 30.0–36.0)
MCV: 99.3 fL (ref 80.0–100.0)
Monocytes Absolute: 1 10*3/uL (ref 0.1–1.0)
Monocytes Relative: 20 %
Neutro Abs: 2.8 10*3/uL (ref 1.7–7.7)
Neutrophils Relative %: 52 %
Platelets: 162 10*3/uL (ref 150–400)
RBC: 4.02 MIL/uL — ABNORMAL LOW (ref 4.22–5.81)
RDW: 17.2 % — ABNORMAL HIGH (ref 11.5–15.5)
WBC: 5.2 10*3/uL (ref 4.0–10.5)
nRBC: 0 % (ref 0.0–0.2)

## 2022-05-19 MED ORDER — FOLIC ACID 1 MG PO TABS: 1.0000 mg | ORAL_TABLET | Freq: Every day | ORAL | 1 refills | Status: DC

## 2022-05-19 MED ORDER — SODIUM CHLORIDE 0.9 % IV SOLN
Freq: Once | INTRAVENOUS | Status: AC
  Filled 2022-05-19: qty 250

## 2022-05-19 MED ORDER — ZOLEDRONIC ACID 4 MG/100ML IV SOLN
4.0000 mg | Freq: Once | INTRAVENOUS | Status: AC
  Administered 2022-05-19: 4 mg via INTRAVENOUS
  Filled 2022-05-19: qty 100

## 2022-05-19 MED ORDER — SODIUM CHLORIDE 0.9% FLUSH
10.0000 mL | Freq: Once | INTRAVENOUS | Status: AC | PRN
  Administered 2022-05-19: 10 mL
  Filled 2022-05-19: qty 10

## 2022-05-19 MED ORDER — HEPARIN SOD (PORK) LOCK FLUSH 100 UNIT/ML IV SOLN
500.0000 [IU] | Freq: Once | INTRAVENOUS | Status: AC | PRN
  Administered 2022-05-19: 500 [IU]
  Filled 2022-05-19: qty 5

## 2022-05-19 NOTE — Progress Notes (Signed)
Needs to have teeth pulled. Wanted to make sure it was ok given he in on eliquis.

## 2022-05-19 NOTE — Progress Notes (Signed)
[facility]  Telephone:([phone] [phone]  ID: Dean [name]. OB: [birth year]  MR#: [id number]  YQM#:[id number]  Patient Care Team: Donita Brooks, MD as PCP - General (Family Medicine) Antony Blackbird, MD as Consulting Physician (Radiation Oncology) West Bali, MD (Inactive) as Consulting Physician (Gastroenterology) Karie Soda, MD as Consulting Physician (General Surgery) Jeralyn Ruths, MD as Consulting Physician (Oncology)  CHIEF COMPLAINT: Multiple myeloma  INTERVAL HISTORY: Patient returns to clinic today for repeat laboratory work, further evaluation, and continuation of Pomalyst and Zometa.  He continues to have intermittent lower extremity edema.  He continues to have chronic weakness and fatigue and bilateral hip pain.  He reports only 2 falls this past month.  He denies any fevers.  He has no neurologic complaints.  He denies any fevers.  His appetite has improved.  He has no chest pain, shortness of breath, cough, or hemoptysis.  He denies any nausea, vomiting, constipation, or diarrhea.  He has no urinary complaints.  Patient offers no further specific complaints today.  REVIEW OF SYSTEMS:   Review of Systems  Constitutional:  Positive for malaise/fatigue. Negative for fever and weight loss.  Respiratory: Negative.  Negative for cough, hemoptysis and shortness of breath.   Cardiovascular:  Positive for leg swelling. Negative for chest pain.  Gastrointestinal: Negative.  Negative for abdominal pain and nausea.  Genitourinary: Negative.  Negative for dysuria and flank pain.  Musculoskeletal:  Positive for back pain and joint pain. Negative for falls.  Skin: Negative.  Negative for rash.  Neurological:  Positive for sensory change and weakness. Negative for dizziness, focal weakness and headaches.  Psychiatric/Behavioral: Negative.  The patient is not nervous/anxious and does not have insomnia.     As per HPI. Otherwise, a  complete review of systems is negative.  PAST MEDICAL HISTORY: Past Medical History:  Diagnosis Date   BPH (benign prostatic hyperplasia)    Colonic diverticular abscess 01/08/2015   Diverticulitis 12/22/2014   complicated by abscess and required percutaneous drainage   GERD (gastroesophageal reflux disease)    H/O ETOH abuse    History of chemotherapy last done jan 2017   History of radiation therapy 01/05/13-02/10/13   45 gray to left occipital condyle region   History of radiation therapy 12/01/16-12/10/16   Parasternal nodule, chest- 24 Gy total delivered in 8 fractions, Left sacro-iliac, pelvis- 24 Gy total delivered in 8 fractions    History of radiation therapy 02/19/17-02/22/17   right temporal scalp 30 Gy in 10 fractions   History of radiation therapy    Right hip( Pelvis) 11/18/21-11/21/21-Dr. Antony Blackbird   Intra-abdominal abscess    Multiple myeloma 2015   Neuropathy    Radiation 02/21/14-03/08/14   right posterior chest wall area 30 gray   Radiation 04/19/14-05/02/14   lumbar spine 25 gray   Skull lesion    Left occipital condyle   Wrist fracture, left    x 2    PAST SURGICAL HISTORY: Past Surgical History:  Procedure Laterality Date   COLONOSCOPY N/A 04/19/2015   Procedure: COLONOSCOPY;  Surgeon: West Bali, MD;  Location: [facility];  Service: Endoscopy;  Laterality: N/A;  1:30 PM   CYST EXCISION  1959   tail bone   IR IMAGING GUIDED PORT INSERTION  09/25/2017    FAMILY HISTORY: Family History  Problem Relation Age of Onset   Diabetes Mother    Stroke Mother    Hypertension Father     ADVANCED DIRECTIVES (Y/N):  N  HEALTH MAINTENANCE: Social History   Tobacco Use   Smoking status: Never   Smokeless tobacco: Never  Vaping Use   Vaping Use: Never used  Substance Use Topics   Alcohol use: No    Comment: " Not much no more"   Drug use: No     Colonoscopy:  PAP:  Bone density:  Lipid panel:  Allergies  Allergen Reactions   Codeine Other  (See Comments)    Headache   Morphine And Related Other (See Comments)    Makes him feel weird   Revlimid [Lenalidomide] Other (See Comments)    "Causes me to become weak"    Current Outpatient Medications  Medication Sig Dispense Refill   acyclovir (ZOVIRAX) 400 MG tablet Take 1 tablet (400 mg total) by mouth 2 (two) times daily. 60 tablet 6   ALPRAZolam (XANAX) 1 MG tablet Take 1 tablet (1 mg total) by mouth 3 (three) times daily as needed. for anxiety 90 tablet 0   apixaban (ELIQUIS) 5 MG TABS tablet TAKE TWO TABLETS BY MOUTH (10MG  TOTAL) TWICE A DAY. THEN TAKE ONE TABLET (5MG ) TWICE A DAY STARTING ON 02/19/22 60 tablet 1   CALCIUM-VITAMIN D PO Take 1 tablet by mouth 2 (two) times daily.      cyanocobalamin (VITAMIN B12) 500 MCG tablet Take 1 tablet (500 mcg total) by mouth daily.     dexamethasone (DECADRON) 4 MG tablet Take 1 tablet (4 mg total) by mouth daily. 30 tablet 0   fluticasone (FLONASE) 50 MCG/ACT nasal spray PLACE 2 SPRAYS INTO BOTH NOSTRILS DAILY 16 g 6   folic acid (FOLVITE) 1 MG tablet Take 1 tablet (1 mg total) by mouth daily. 90 tablet 1   furosemide (LASIX) 20 MG tablet Take 1 tablet (20 mg total) by mouth daily as needed. 90 tablet 1   gabapentin (NEURONTIN) 400 MG capsule Take 1 capsule (400 mg total) by mouth 3 (three) times daily. 90 capsule 4   Homeopathic Products (THERAWORX RELIEF EX) Apply topically as needed.     montelukast (SINGULAIR) 10 MG tablet TAKE 1 TABLET BY MOUTH EVERY NIGHT AT BEDTIME 30 tablet 3   Oxycodone HCl 10 MG TABS TAKE ONE TO TWO TABLETS BY MOUTH THREE TIMES DAILY AS NEEDED 90 tablet 0   pomalidomide (POMALYST) 3 MG capsule Take 1 capsule (3 mg total) by mouth daily. Take for 21 days, then hold for 7 days. Repeat every 28 days. 21 capsule 0   tamsulosin (FLOMAX) 0.4 MG CAPS capsule TAKE ONE CAPSULE BY MOUTH EVERY NIGHT ATBEDTIME 30 capsule 3   traZODone (DESYREL) 100 MG tablet TAKE 3 TABLETS BY MOUTH EVERY NIGHT AT BEDTIME 90 tablet 1    TURMERIC PO Take 1 tablet by mouth 2 (two) times daily.     amoxicillin (AMOXIL) 500 MG capsule Take 500 mg by mouth 2 (two) times daily. (Patient not taking: Reported on 05/19/2022)     folic acid (FOLVITE) 1 MG tablet Take 1 tablet (1 mg total) by mouth daily. (Patient not taking: Reported on 05/19/2022)     lidocaine-prilocaine (EMLA) cream Apply small amount over port one (1) hour prior to appointment. (Patient not taking: Reported on 03/21/2022) 30 g 0   No current facility-administered medications for this visit.   Facility-Administered Medications Ordered in Other Visits  Medication Dose Route Frequency Provider Last Rate Last Admin   heparin lock flush 100 unit/mL  500 Units Intravenous Once Jeralyn Ruths, MD  OBJECTIVE: Vitals:   05/19/22 0929  BP: (!) 142/70  Pulse: (!) 57  Resp: 18  Temp: (!) 96.9 F (36.1 C)      Body mass index is 25.51 kg/m.    ECOG FS:2 - Symptomatic, <50% confined to bed  General: Well-developed, well-nourished, no acute distress.  Sitting in a wheelchair. Eyes: Pink conjunctiva, anicteric sclera. HEENT: Normocephalic, moist mucous membranes. Lungs: No audible wheezing or coughing. Heart: Regular rate and rhythm. Abdomen: Soft, nontender, no obvious distention. Musculoskeletal: No edema, cyanosis, or clubbing. Neuro: Alert, answering all questions appropriately. Cranial nerves grossly intact. Skin: No rashes or petechiae noted. Psych: Normal affect.  LAB RESULTS:  Lab Results  Component Value Date   NA 140 05/19/2022   K 4.0 05/19/2022   CL 106 05/19/2022   CO2 30 05/19/2022   GLUCOSE 96 05/19/2022   BUN 29 (H) 05/19/2022   CREATININE 0.69 05/19/2022   CALCIUM 8.3 (L) 05/19/2022   PROT 7.0 05/19/2022   ALBUMIN 2.9 (L) 05/19/2022   AST 16 05/19/2022   ALT 16 05/19/2022   ALKPHOS 25 (L) 05/19/2022   BILITOT 0.4 05/19/2022   GFRNONAA >60 05/19/2022   GFRAA >60 10/13/2019    Lab Results  Component Value Date   WBC 5.2  05/19/2022   NEUTROABS 2.8 05/19/2022   HGB 12.5 (L) 05/19/2022   HCT 39.9 05/19/2022   MCV 99.3 05/19/2022   PLT 162 05/19/2022     STUDIES: No results found.  ASSESSMENT: Multiple myeloma  PLAN:  Multiple myeloma: Please see note from September 17, 2020 for historic details of his myeloma and treatment.  Since reinitiating daratumumab, patient's M spike trended down to 3.6, but recently has increased back to 5.3.  He was switched to single agent Pomalyst and his most recent M spike is trended down to 1.3.  IgG and kappa free light chains also continue to trend down as well.  We previously discontinuing treatment and hospice, but patient wishes to pursue Pomalyst.  He expressed understanding that no other treatments would be available if Pomalyst were no longer working.  Proceed with dose reduced Pomalyst 3 mg daily for 21 days with 7 days off.  Patient will also receive Zometa today.  Return to clinic in 4 weeks for further evaluation and continuation of treatment.   Thrombocytopenia: Resolved. Back/joint pain: Chronic and unchanged.  MRI results reviewed independently consistent with progressive myeloma.  No pathologic fractures reported.  Patient has completed palliative XRT.  Continue current narcotics as prescribed.    Peripheral neuropathy: Chronic and unchanged.  Continue gabapentin as prescribed. Insomnia/anxiety: Chronic and unchanged.  Patient is now having his medications adjusted by primary care.   Fractured left clavicle:  Follow-up with orthopedics as indicated. Anemia: Hemoglobin mildly improved to 10.5.   Hypocalcemia: Given patient's low albumin this corrects to near normal.  Proceed with Zometa as above. Pulmonary embolism: Patient is now on Eliquis.  Patient also confirms DNR/DNI.   Patient expressed understanding and was in agreement with this plan. He also understands that He can call clinic at any time with any questions, concerns, or complaints.    Cancer Staging   No matching staging information was found for the patient.  Jeralyn Ruths, MD   05/19/2022 3:31 PM

## 2022-05-20 LAB — KAPPA/LAMBDA LIGHT CHAINS
Kappa free light chain: 91.2 mg/L — ABNORMAL HIGH (ref 3.3–19.4)
Kappa, lambda light chain ratio: 8 — ABNORMAL HIGH (ref 0.26–1.65)
Lambda free light chains: 11.4 mg/L (ref 5.7–26.3)

## 2022-05-21 ENCOUNTER — Telehealth: Payer: Self-pay | Admitting: Family Medicine

## 2022-05-21 LAB — IGG, IGA, IGM
IgA: 56 mg/dL — ABNORMAL LOW (ref 61–437)
IgG (Immunoglobin G), Serum: 1944 mg/dL — ABNORMAL HIGH (ref 603–1613)
IgM (Immunoglobulin M), Srm: 26 mg/dL (ref 15–143)

## 2022-05-21 NOTE — Telephone Encounter (Signed)
Called patient to schedule Medicare Annual Wellness Visit (AWV). Left message for patient to call back and schedule Medicare Annual Wellness Visit (AWV).  Last date of AWV: 04/25/2021   Please schedule an appointment at any time with Toni Amend, [facility] .  If any questions, please contact me at [phone].  Thank you,  Judeth Cornfield,  AMB Clinical Support [facility] AWV Program Direct Dial ??[phone]

## 2022-05-26 ENCOUNTER — Telehealth: Payer: Self-pay | Admitting: *Deleted

## 2022-05-26 NOTE — Telephone Encounter (Signed)
CALLED PATIENT'S DAUGHTER- BARBARA TO INFORM OF MRI FOR 06-13-22- ARRIVAL TIME- 2:45 PM @ [facility], NO RESTRICTIONS TO TEST, DR. KINARD TO CALL PATIENT WITH THE RESULTS, SPOKE WITH PATIENT'S DAUGHTER- BARBARA AND SHE IS AWARE OF THIS SCAN AND THE FU THEREAFTER

## 2022-06-04 ENCOUNTER — Telehealth: Payer: Self-pay

## 2022-06-04 NOTE — Telephone Encounter (Signed)
Annette an Charity fundraiser with [facility] called in to make pcp aware that pt had a fall last week. Pt stated that he slipped off the sofa. Pt denies any pain or injury.   If there are any questions please call Drinda Butts RN with [facility] at [phone]  Pt LOV: 11/12/21

## 2022-06-09 ENCOUNTER — Other Ambulatory Visit: Payer: Self-pay

## 2022-06-09 DIAGNOSIS — C9000 Multiple myeloma not having achieved remission: Secondary | ICD-10-CM

## 2022-06-09 MED ORDER — POMALIDOMIDE 3 MG PO CAPS
3.0000 mg | ORAL_CAPSULE | Freq: Every day | ORAL | 0 refills | Status: DC
Start: 2022-06-09 — End: 2022-07-08

## 2022-06-10 ENCOUNTER — Telehealth: Payer: Self-pay

## 2022-06-10 NOTE — Telephone Encounter (Signed)
Debbie with [facility] called office to get VO from pcp for pt:  Physical Therapy would like to extend orders for pt to get PT:  2x a week for 6 weeks.  Please call Debbie/[facility] at [phone]

## 2022-06-13 ENCOUNTER — Ambulatory Visit (HOSPITAL_COMMUNITY): Payer: Medicare Other

## 2022-06-16 ENCOUNTER — Other Ambulatory Visit: Payer: Self-pay | Admitting: Oncology

## 2022-06-16 DIAGNOSIS — C9000 Multiple myeloma not having achieved remission: Secondary | ICD-10-CM

## 2022-06-19 ENCOUNTER — Inpatient Hospital Stay: Payer: Medicare Other | Admitting: Pharmacist

## 2022-06-19 ENCOUNTER — Inpatient Hospital Stay: Payer: Medicare Other

## 2022-06-19 ENCOUNTER — Encounter: Payer: Self-pay | Admitting: Oncology

## 2022-06-19 ENCOUNTER — Inpatient Hospital Stay: Payer: Medicare Other | Attending: Oncology

## 2022-06-19 ENCOUNTER — Inpatient Hospital Stay (HOSPITAL_BASED_OUTPATIENT_CLINIC_OR_DEPARTMENT_OTHER): Payer: Medicare Other | Admitting: Oncology

## 2022-06-19 VITALS — BP 113/72 | HR 80 | Temp 97.6°F | Resp 16 | Ht 68.0 in | Wt 170.0 lb

## 2022-06-19 DIAGNOSIS — C9000 Multiple myeloma not having achieved remission: Secondary | ICD-10-CM | POA: Diagnosis not present

## 2022-06-19 LAB — COMPREHENSIVE METABOLIC PANEL
ALT: 15 U/L (ref 0–44)
AST: 26 U/L (ref 15–41)
Albumin: 2.6 g/dL — ABNORMAL LOW (ref 3.5–5.0)
Alkaline Phosphatase: 25 U/L — ABNORMAL LOW (ref 38–126)
Anion gap: 5 (ref 5–15)
BUN: 25 mg/dL — ABNORMAL HIGH (ref 8–23)
CO2: 27 mmol/L (ref 22–32)
Calcium: 8.2 mg/dL — ABNORMAL LOW (ref 8.9–10.3)
Chloride: 108 mmol/L (ref 98–111)
Creatinine, Ser: 0.78 mg/dL (ref 0.61–1.24)
GFR, Estimated: >60 mL/min (ref >60)
Glucose, Bld: 148 mg/dL — ABNORMAL HIGH (ref 70–99)
Potassium: 3.8 mmol/L (ref 3.5–5.1)
Sodium: 140 mmol/L (ref 135–145)
Total Bilirubin: 0.4 mg/dL (ref 0.3–1.2)
Total Protein: 7.2 g/dL (ref 6.5–8.1)

## 2022-06-19 LAB — CBC WITH DIFFERENTIAL/PLATELET
Abs Immature Granulocytes: 0.07 10*3/uL (ref 0.00–0.07)
Basophils Absolute: 0.1 10*3/uL (ref 0.0–0.1)
Basophils Relative: 3 %
Eosinophils Absolute: 0 10*3/uL (ref 0.0–0.5)
Eosinophils Relative: 1 %
HCT: 38.5 % — ABNORMAL LOW (ref 39.0–52.0)
Hemoglobin: 12.1 g/dL — ABNORMAL LOW (ref 13.0–17.0)
Immature Granulocytes: 2 %
Lymphocytes Relative: 38 %
Lymphs Abs: 1.5 10*3/uL (ref 0.7–4.0)
MCH: 30.7 pg (ref 26.0–34.0)
MCHC: 31.4 g/dL (ref 30.0–36.0)
MCV: 97.7 fL (ref 80.0–100.0)
Monocytes Absolute: 0.6 10*3/uL (ref 0.1–1.0)
Monocytes Relative: 16 %
Neutro Abs: 1.6 10*3/uL — ABNORMAL LOW (ref 1.7–7.7)
Neutrophils Relative %: 40 %
Platelets: 178 10*3/uL (ref 150–400)
RBC: 3.94 MIL/uL — ABNORMAL LOW (ref 4.22–5.81)
RDW: 16.8 % — ABNORMAL HIGH (ref 11.5–15.5)
WBC: 3.9 10*3/uL — ABNORMAL LOW (ref 4.0–10.5)
nRBC: 0 % (ref 0.0–0.2)

## 2022-06-19 MED ORDER — SODIUM CHLORIDE 0.9 % IV SOLN
Freq: Once | INTRAVENOUS | Status: AC
  Filled 2022-06-19: qty 250

## 2022-06-19 MED ORDER — ZOLEDRONIC ACID 4 MG/100ML IV SOLN
4.0000 mg | Freq: Once | INTRAVENOUS | Status: AC
  Administered 2022-06-19: 4 mg via INTRAVENOUS
  Filled 2022-06-19: qty 100

## 2022-06-19 NOTE — Progress Notes (Signed)
Oral Chemotherapy Clinic [facility]  [phone] Fax:([phone]  Patient Care Team: Donita Brooks, MD as PCP - General (Family Medicine) Antony Blackbird, MD as Consulting Physician (Radiation Oncology) West Bali, MD (Inactive) as Consulting Physician (Gastroenterology) Karie Soda, MD as Consulting Physician (General Surgery) Jeralyn Ruths, MD as Consulting Physician (Oncology)   Name of the patient: Dean [name]  [id number]  Oct 31, 1935   Date of visit: 06/19/22  HPI: Patient is a 87 y.o. male with multiple myeloma, currently treated with pomalidomide.  Reason for Consult: Oral chemotherapy follow-up for pomalidomide therapy.   PAST MEDICAL HISTORY: Past Medical History:  Diagnosis Date   BPH (benign prostatic hyperplasia)    Colonic diverticular abscess 01/08/2015   Diverticulitis 12/22/2014   complicated by abscess and required percutaneous drainage   GERD (gastroesophageal reflux disease)    H/O ETOH abuse    History of chemotherapy last done jan 2017   History of radiation therapy 01/05/13-02/10/13   45 gray to left occipital condyle region   History of radiation therapy 12/01/16-12/10/16   Parasternal nodule, chest- 24 Gy total delivered in 8 fractions, Left sacro-iliac, pelvis- 24 Gy total delivered in 8 fractions    History of radiation therapy 02/19/17-02/22/17   right temporal scalp 30 Gy in 10 fractions   History of radiation therapy    Right hip( Pelvis) 11/18/21-11/21/21-Dr. Antony Blackbird   Intra-abdominal abscess [facility])    Multiple myeloma (HCC) 2015   Neuropathy    Radiation 02/21/14-03/08/14   right posterior chest wall area 30 gray   Radiation 04/19/14-05/02/14   lumbar spine 25 gray   Skull lesion    Left occipital condyle   Wrist fracture, left    x 2    HEMATOLOGY/ONCOLOGY HISTORY:  Oncology History  Multiple myeloma without remission (HCC)  11/16/2012 Initial Diagnosis   L occipital condyle destructive  lesion, not biopsied secondary to location. XRT   12/01/2012 Imaging   L3 superior endplate compression FX, no lytic lesions to suggest myeloma   02/06/2014 Imaging   soft tissue mass along the right posterior fifth rib with some rib destruction   02/16/2014 Miscellaneous   Normal CBC, Normal CMP, kappa,lamda ratio of 2.62 (abnormal), UIEP with slightly restricted band, IgG kappa, SPEP/IEP with monoclonal protein at 0.79 g/dl, normal igG, suppressed IGM   02/20/2014 Bone Marrow Biopsy   33% kappa restricted plasma cells Normal FISH, normal cytogenetics   02/21/2014 - 03/08/2014 Radiation Therapy   30Gy to R rib lesion, lesion not biopsied   03/16/2014 PET scan   Scattered hypermetabolic osseous lesions, including the calvarium, ribs, left scapula, sacrum, and left femoral shaft. An expansile left vertebral body lesion at L3 extends into the epidural space of the left lateral recess,    03/17/2014 - 04/07/2014 Chemotherapy   Velcade and Dexamethasone due to initial denial of Revlimid by insurance company.  Zometa monthly.   03/22/2014 Imaging   MR_ L-Spine- Enhancing lesions compatible with multiple myeloma at L2, L3, and S1-2.   04/07/2014 - 07/28/2014 Chemotherapy   RVD with Revlimid at 25 mg days 1-14.  Revlimid dose reduced to 25 mg every other day x 14 days with 7 day respite beginning on day 1 of cycle 2.   04/17/2014 Adverse Reaction   Revlimid-induced rash.  Treated with steroids.   07/28/2014 - 08/04/2014 Chemotherapy   Revlimid daily   08/04/2014 Adverse Reaction   Velcade-induced peripheral neuropathy   08/04/2014 Treatment Plan Change   D/C Velcade  08/11/2014 PET scan   Response to therapy. Improvement and resolution of foci of osseous hypermetabolism.   08/22/2014 - 08/28/2014 Chemotherapy   Revlimid 25 mg days 1-21 every 28 days   08/28/2014 Adverse Reaction   Revlimid-induced rash. Medication held.  Medrol dose Pak prescribed.   09/04/2014 - 01/08/2015 Chemotherapy   Revlimid 10  mg every other day, without dexamethasone   01/08/2015 - 01/15/2015 Hospital Admission   Colonic diverticular abscess with IR drain placement   03/02/2015 PET scan   Only bony uptake is low activity at site of deformity/callus at R second rib FX, high activity in sigmoid colon, advanced sigmoid divertidulosis. Abscess not resolved?   06/04/2015 Imaging   CT nonobstructive L renal calculus, diverticulosis of descending and sigmoid colon without inflammation, moderate prostatic enlargement   07/12/2015 Surgery   Diverticulitis s/p robotic sigmoid colectomy with Dr. Michaell Cowing   08/23/2015 PET scan   Primarily similar hypermetabolic osseous foci within the R sided ribs. new L third rib focus of hypermetab and sclerosis is favored to be related to healing FX. no soft tissue myeloma id'd. Presumed postop hypermetab and edema about the R pelvic wall    03/06/2016 PET scan   1. Reduced activity in the prior lesion such as the right second and fifth rib lesions, activity nearly completely resolved and significantly less than added mediastinal blood pool activity. No new lesions are identified. 2. Other imaging findings of potential clinical significance: Coronary, aortic arch, and branch vessel atherosclerotic vascular disease. Aortoiliac atherosclerotic vascular disease. Enlarged prostate gland. Colonic diverticula.   02/17/2017 PET scan   HEAD/NECK: No hypermetabolic activity in the scalp. No hypermetabolic cervical lymph nodes. CHEST: No hypermetabolic mediastinal or hilar nodes. Right upper lobe scarring/atelectasis. No suspicious pulmonary nodules on the CT scan. ABDOMEN/PELVIS: No abnormal hypermetabolic activity within the liver, pancreas, adrenal glands, or spleen. No hypermetabolic lymph nodes in the abdomen or pelvis. Atherosclerotic calcifications of the abdominal aorta and branch vessels. Left renal sinus cysts. Sigmoid diverticulosis, without evidence of diverticulitis. Prostatomegaly. SKELETON:  Vague hypermetabolism involving the inferior sternum, max SUV 3.2, previously 8.2. Focal hypermetabolism involving the left sacrum, max SUV 4.0, previously 6.7. EXTREMITIES: No abnormal hypermetabolic activity in the lower extremities.   02/18/2017 -  Chemotherapy   Bortezomib 1.3mg /m2 QWk + Dexamethasone 10mg  QTue/Fri --Cycle #1, 02/18/17    02/18/2017 - 08/26/2017 Chemotherapy   The patient had bortezomib SQ (VELCADE) chemo injection 3 mg, 1.3 mg/m2 = 3 mg, Subcutaneous,  Once, 7 of 9 cycles Administration: 3 mg (02/18/2017), 3 mg (02/26/2017), 3 mg (03/04/2017), 3 mg (03/11/2017), 3 mg (04/08/2017), 3 mg (03/18/2017), 3 mg (03/25/2017), 3 mg (04/01/2017), 3 mg (05/06/2017), 3 mg (05/20/2017), 3 mg (06/03/2017), 3 mg (06/17/2017), 3 mg (07/01/2017), 3 mg (07/15/2017), 3 mg (07/29/2017), 3 mg (08/12/2017), 3 mg (08/26/2017)  for chemotherapy treatment.    09/17/2017 - 10/02/2021 Chemotherapy   Patient is on Treatment Plan : MYELOMA Daratumumab q28d     09/17/2017 - 12/04/2021 Chemotherapy   Patient is on Treatment Plan : MYELOMA Daratumumab SQ q28d       ALLERGIES:  is allergic to codeine, morphine and codeine, and revlimid [lenalidomide].  MEDICATIONS:  Current Outpatient Medications  Medication Sig Dispense Refill   acyclovir (ZOVIRAX) 400 MG tablet Take 1 tablet (400 mg total) by mouth 2 (two) times daily. 60 tablet 6   ALPRAZolam (XANAX) 1 MG tablet Take 1 tablet (1 mg total) by mouth 3 (three) times daily as needed. for anxiety 90 tablet 0  amoxicillin (AMOXIL) 500 MG capsule Take 500 mg by mouth 2 (two) times daily. (Patient not taking: Reported on 05/19/2022)     apixaban (ELIQUIS) 5 MG TABS tablet TAKE TWO TABLETS BY MOUTH (10MG  TOTAL) TWICE A DAY. THEN TAKE ONE TABLET (5MG ) TWICE A DAY STARTING ON 02/19/22 60 tablet 1   CALCIUM-VITAMIN D PO Take 1 tablet by mouth 2 (two) times daily.      cyanocobalamin (VITAMIN B12) 500 MCG tablet Take 1 tablet (500 mcg total) by mouth daily.     dexamethasone  (DECADRON) 4 MG tablet Take 1 tablet (4 mg total) by mouth daily. 30 tablet 0   fluticasone (FLONASE) 50 MCG/ACT nasal spray PLACE 2 SPRAYS INTO BOTH NOSTRILS DAILY 16 g 6   folic acid (FOLVITE) 1 MG tablet Take 1 tablet (1 mg total) by mouth daily. (Patient not taking: Reported on 05/19/2022)     folic acid (FOLVITE) 1 MG tablet Take 1 tablet (1 mg total) by mouth daily. 90 tablet 1   furosemide (LASIX) 20 MG tablet Take 1 tablet (20 mg total) by mouth daily as needed. 90 tablet 1   gabapentin (NEURONTIN) 400 MG capsule Take 1 capsule (400 mg total) by mouth 3 (three) times daily. 90 capsule 4   Homeopathic Products (THERAWORX RELIEF EX) Apply topically as needed.     lidocaine-prilocaine (EMLA) cream Apply small amount over port one (1) hour prior to appointment. (Patient not taking: Reported on 03/21/2022) 30 g 0   montelukast (SINGULAIR) 10 MG tablet TAKE 1 TABLET BY MOUTH EVERY NIGHT AT BEDTIME 30 tablet 3   Oxycodone HCl 10 MG TABS TAKE ONE TO TWO TABLETS BY MOUTH THREE TIMES DAILY AS NEEDED 90 tablet 0   pomalidomide (POMALYST) 3 MG capsule Take 1 capsule (3 mg total) by mouth daily. Take for 21 days, then hold for 7 days. Repeat every 28 days. 21 capsule 0   tamsulosin (FLOMAX) 0.4 MG CAPS capsule TAKE ONE CAPSULE BY MOUTH EVERY NIGHT ATBEDTIME 30 capsule 3   traZODone (DESYREL) 100 MG tablet TAKE 3 TABLETS BY MOUTH EVERY NIGHT AT BEDTIME 90 tablet 1   TURMERIC PO Take 1 tablet by mouth 2 (two) times daily.     No current facility-administered medications for this visit.   Facility-Administered Medications Ordered in Other Visits  Medication Dose Route Frequency Provider Last Rate Last Admin   heparin lock flush 100 unit/mL  500 Units Intravenous Once Jeralyn Ruths, MD        VITAL SIGNS: There were no vitals taken for this visit. There were no vitals filed for this visit.  Estimated body mass index is 25.51 kg/m as calculated from the following:   Height as of 04/21/22: 5\' 8"   (1.727 m).   Weight as of 05/19/22: 76.1 kg (167 lb 12.8 oz).  LABS: CBC:    Component Value Date/Time   WBC 5.2 05/19/2022 0900   HGB 12.5 (L) 05/19/2022 0900   HCT 39.9 05/19/2022 0900   PLT 162 05/19/2022 0900   MCV 99.3 05/19/2022 0900   NEUTROABS 2.8 05/19/2022 0900   LYMPHSABS 1.4 05/19/2022 0900   MONOABS 1.0 05/19/2022 0900   EOSABS 0.0 05/19/2022 0900   BASOSABS 0.0 05/19/2022 0900   Comprehensive Metabolic Panel:    Component Value Date/Time   NA 140 05/19/2022 0900   K 4.0 05/19/2022 0900   CL 106 05/19/2022 0900   CO2 30 05/19/2022 0900   BUN 29 (H) 05/19/2022 0900   CREATININE 0.69  05/19/2022 0900   CREATININE 0.51 (L) 03/20/2022 0914   CREATININE 0.81 03/26/2021 1117   GLUCOSE 96 05/19/2022 0900   CALCIUM 8.3 (L) 05/19/2022 0900   AST 16 05/19/2022 0900   AST 18 03/20/2022 0914   ALT 16 05/19/2022 0900   ALT 13 03/20/2022 0914   ALKPHOS 25 (L) 05/19/2022 0900   BILITOT 0.4 05/19/2022 0900   BILITOT 0.4 03/20/2022 0914   PROT 7.0 05/19/2022 0900   ALBUMIN 2.9 (L) 05/19/2022 0900     Present during today's visit: patient and his daughter  Assessment and Plan: Continue pomalidomide 3mg , 21 on/7 off   Oral Chemotherapy Side Effect/Intolerance:  No reported side effects, patient tolerating medication well  Oral Chemotherapy Adherence: no missed doses reported No patient barriers to medication adherence identified.   New medications: none reported  Medication Access Issues: Patient awaiting medication delivery, per [facility] medication is out for delivery today.   Patient expressed understanding and was in agreement with this plan. He also understands that He can call clinic at any time with any questions, concerns, or complaints.   Follow-up plan: RTC in 4 weeks  Thank you for allowing me to participate in the care of this very pleasant patient.   Time Total: 10 mins  Visit consisted of counseling and education on dealing with issues  of symptom management in the setting of serious and potentially life-threatening illness.Greater than 50%  of this time was spent counseling and coordinating care related to the above assessment and plan.  Signed by: Remi Haggard, PharmD, BCPS, Nolon Bussing, CPP Hematology/Oncology Clinical Pharmacist Practitioner [place]/[facility] Oral Chemotherapy [facility] [phone]  06/19/2022 9:16 AM

## 2022-06-19 NOTE — Progress Notes (Signed)
Nutrition Follow-up:  Patient with multiple myeloma.  Patient currently on pomalidomide.  Also receiving zometa infusions  Met with patient during infusion.  Daughter present.  Reports that appetite is better and weight has increased.  "I feel hungry and eat all the time."  Has been eating [employer] milkshakes (snickers).  Eating eggs with cheese, grits, beef stew.  Continue carnation instant breakfast (2 packets/drink) about 2 times a day.  Patient is working with home PT and improving strength.    Medications: reviewed  Labs: glucose 148, BUN 25, creatinine 0.78, calcium 8.2  Anthropometrics:   Weight 170 lb today  159 lb on 10/10 162 lb on 9/27 168 lb on 7/5 173 lb on 3/22 183 lb on 2/7 176 lb on 1/11   NUTRITION DIAGNOSIS: Inadequate oral intake improved    INTERVENTION:  Continue high calorie, high protein foods Continue carnation instant breakfast 2-3 times a day Discussed importance of PT and nutrition to help improve strength and keep patient independent    MONITORING, EVALUATION, GOAL: weight trends, intake   NEXT VISIT: as needed  Dean [name] B. Freida Busman, RD, LDN Registered Dietitian [phone]

## 2022-06-19 NOTE — Progress Notes (Signed)
[name]  Telephone:([phone] [phone]  ID: Dean [name]. OB: [birth year]  MR#: [id number]  MWU#:[id number]  Patient Care Team: Donita Brooks, MD as PCP - General (Family Medicine) Antony Blackbird, MD as Consulting Physician (Radiation Oncology) West Bali, MD (Inactive) as Consulting Physician (Gastroenterology) Karie Soda, MD as Consulting Physician (General Surgery) Jeralyn Ruths, MD as Consulting Physician (Oncology)  CHIEF COMPLAINT: Multiple myeloma  INTERVAL HISTORY: Patient returns to clinic today for repeat laboratory work, further evaluation, and continuation of Pomalyst and Zometa.  He continues to have chronic weakness and fatigue and multiple falls.  He continues to have chronic bilateral hip pain.  He denies any fevers.  He has no neurologic complaints.  He denies any fevers.  His appetite has improved and he has gained weight.  He has no chest pain, shortness of breath, cough, or hemoptysis.  He denies any nausea, vomiting, constipation, or diarrhea.  He has no urinary complaints.  Patient offers no further specific complaints today.  REVIEW OF SYSTEMS:   Review of Systems  Constitutional:  Positive for malaise/fatigue. Negative for fever and weight loss.  Respiratory: Negative.  Negative for cough, hemoptysis and shortness of breath.   Cardiovascular: Negative.  Negative for chest pain and leg swelling.  Gastrointestinal: Negative.  Negative for abdominal pain and nausea.  Genitourinary: Negative.  Negative for dysuria and flank pain.  Musculoskeletal:  Positive for back pain and joint pain. Negative for falls.  Skin: Negative.  Negative for rash.  Neurological:  Positive for sensory change and weakness. Negative for dizziness, focal weakness and headaches.  Psychiatric/Behavioral: Negative.  The patient is not nervous/anxious and does not have insomnia.     As per HPI. Otherwise, a complete review of systems is  negative.  PAST MEDICAL HISTORY: Past Medical History:  Diagnosis Date   BPH (benign prostatic hyperplasia)    Colonic diverticular abscess 01/08/2015   Diverticulitis 12/22/2014   complicated by abscess and required percutaneous drainage   GERD (gastroesophageal reflux disease)    H/O ETOH abuse    History of chemotherapy last done jan 2017   History of radiation therapy 01/05/13-02/10/13   45 gray to left occipital condyle region   History of radiation therapy 12/01/16-12/10/16   Parasternal nodule, chest- 24 Gy total delivered in 8 fractions, Left sacro-iliac, pelvis- 24 Gy total delivered in 8 fractions    History of radiation therapy 02/19/17-02/22/17   right temporal scalp 30 Gy in 10 fractions   History of radiation therapy    Right hip( Pelvis) 11/18/21-11/21/21-Dr. Antony Blackbird   Intra-abdominal abscess (HCC)    Multiple myeloma (HCC) 2015   Neuropathy    Radiation 02/21/14-03/08/14   right posterior chest wall area 30 gray   Radiation 04/19/14-05/02/14   lumbar spine 25 gray   Skull lesion    Left occipital condyle   Wrist fracture, left    x 2    PAST SURGICAL HISTORY: Past Surgical History:  Procedure Laterality Date   COLONOSCOPY N/A 04/19/2015   Procedure: COLONOSCOPY;  Surgeon: West Bali, MD;  Location: [facility];  Service: Endoscopy;  Laterality: N/A;  1:30 PM   CYST EXCISION  1959   tail bone   IR IMAGING GUIDED PORT INSERTION  09/25/2017    FAMILY HISTORY: Family History  Problem Relation Age of Onset   Diabetes Mother    Stroke Mother    Hypertension Father     ADVANCED DIRECTIVES (Y/N):  N  HEALTH MAINTENANCE: Social History   Tobacco Use   Smoking status: Never   Smokeless tobacco: Never  Vaping Use   Vaping Use: Never used  Substance Use Topics   Alcohol use: No    Comment: " Not much no more"   Drug use: No     Colonoscopy:  PAP:  Bone density:  Lipid panel:  Allergies  Allergen Reactions   Codeine Other (See Comments)     Headache   Morphine And Codeine Other (See Comments)    Makes him feel weird   Revlimid [Lenalidomide] Other (See Comments)    "Causes me to become weak"    Current Outpatient Medications  Medication Sig Dispense Refill   acyclovir (ZOVIRAX) 400 MG tablet Take 1 tablet (400 mg total) by mouth 2 (two) times daily. 60 tablet 6   ALPRAZolam (XANAX) 1 MG tablet Take 1 tablet (1 mg total) by mouth 3 (three) times daily as needed. for anxiety 90 tablet 0   apixaban (ELIQUIS) 5 MG TABS tablet TAKE TWO TABLETS BY MOUTH (10MG  TOTAL) TWICE A DAY. THEN TAKE ONE TABLET (5MG ) TWICE A DAY STARTING ON 02/19/22 60 tablet 1   CALCIUM-VITAMIN D PO Take 1 tablet by mouth 2 (two) times daily.      cyanocobalamin (VITAMIN B12) 500 MCG tablet Take 1 tablet (500 mcg total) by mouth daily.     dexamethasone (DECADRON) 4 MG tablet Take 1 tablet (4 mg total) by mouth daily. 30 tablet 0   fluticasone (FLONASE) 50 MCG/ACT nasal spray PLACE 2 SPRAYS INTO BOTH NOSTRILS DAILY 16 g 6   folic acid (FOLVITE) 1 MG tablet Take 1 tablet (1 mg total) by mouth daily. 90 tablet 1   furosemide (LASIX) 20 MG tablet Take 1 tablet (20 mg total) by mouth daily as needed. 90 tablet 1   gabapentin (NEURONTIN) 400 MG capsule Take 1 capsule (400 mg total) by mouth 3 (three) times daily. 90 capsule 4   Homeopathic Products (THERAWORX RELIEF EX) Apply topically as needed.     lidocaine-prilocaine (EMLA) cream Apply small amount over port one (1) hour prior to appointment. 30 g 0   montelukast (SINGULAIR) 10 MG tablet TAKE 1 TABLET BY MOUTH EVERY NIGHT AT BEDTIME 30 tablet 3   Oxycodone HCl 10 MG TABS TAKE ONE TO TWO TABLETS BY MOUTH THREE TIMES DAILY AS NEEDED 90 tablet 0   pomalidomide (POMALYST) 3 MG capsule Take 1 capsule (3 mg total) by mouth daily. Take for 21 days, then hold for 7 days. Repeat every 28 days. 21 capsule 0   tamsulosin (FLOMAX) 0.4 MG CAPS capsule TAKE ONE CAPSULE BY MOUTH EVERY NIGHT ATBEDTIME 30 capsule 3   traZODone  (DESYREL) 100 MG tablet TAKE 3 TABLETS BY MOUTH EVERY NIGHT AT BEDTIME 90 tablet 1   TURMERIC PO Take 1 tablet by mouth 2 (two) times daily.     amoxicillin (AMOXIL) 500 MG capsule Take 500 mg by mouth 2 (two) times daily. (Patient not taking: Reported on 05/19/2022)     No current facility-administered medications for this visit.   Facility-Administered Medications Ordered in Other Visits  Medication Dose Route Frequency Provider Last Rate Last Admin   heparin lock flush 100 unit/mL  500 Units Intravenous Once Jeralyn Ruths, MD        OBJECTIVE: Vitals:   06/19/22 0900  BP: 113/72  Pulse: 80  Resp: 16  Temp: 97.6 F (36.4 C)  SpO2: 97%      Body mass  index is 25.85 kg/m.    ECOG FS:2 - Symptomatic, <50% confined to bed  General: Well-developed, well-nourished, no acute distress.  Sitting in a wheelchair. Eyes: Pink conjunctiva, anicteric sclera. HEENT: Normocephalic, moist mucous membranes. Lungs: No audible wheezing or coughing. Heart: Regular rate and rhythm. Abdomen: Soft, nontender, no obvious distention. Musculoskeletal: No edema, cyanosis, or clubbing. Neuro: Alert, answering all questions appropriately. Cranial nerves grossly intact. Skin: No rashes or petechiae noted. Psych: Normal affect.  LAB RESULTS:  Lab Results  Component Value Date   NA 140 06/19/2022   K 3.8 06/19/2022   CL 108 06/19/2022   CO2 27 06/19/2022   GLUCOSE 148 (H) 06/19/2022   BUN 25 (H) 06/19/2022   CREATININE 0.78 06/19/2022   CALCIUM 8.2 (L) 06/19/2022   PROT 7.2 06/19/2022   ALBUMIN 2.6 (L) 06/19/2022   AST 26 06/19/2022   ALT 15 06/19/2022   ALKPHOS 25 (L) 06/19/2022   BILITOT 0.4 06/19/2022   GFRNONAA >60 06/19/2022   GFRAA >60 10/13/2019    Lab Results  Component Value Date   WBC 3.9 (L) 06/19/2022   NEUTROABS 1.6 (L) 06/19/2022   HGB 12.1 (L) 06/19/2022   HCT 38.5 (L) 06/19/2022   MCV 97.7 06/19/2022   PLT 178 06/19/2022     STUDIES: No results  found.  ASSESSMENT: Multiple myeloma  PLAN:  Multiple myeloma: Please see note from September 17, 2020 for historic details of his myeloma and treatment.  Since reinitiating daratumumab, patient's M spike trended down to 3.6, but recently has increased back to 5.3.  He was switched to single agent Pomalyst and his most recent M spike is stable at 1.3.  IgG and kappa free light chains have trended up slightly, but are essentially unchanged.  We previously discussed discontinuing treatment and hospice, but patient wishes to pursue Pomalyst.  He expressed understanding that there are likely no other treatments available if Pomalyst were no longer working.  Proceed with dose reduced Pomalyst 3 mg daily for 21 days with 7 days off.  Patient will also receive Zometa today.  Return to clinic in 4 weeks for further evaluation and continuation of treatment.  Appreciate clinical pharmacy input. Thrombocytopenia: Resolved. Back/joint pain: Chronic and unchanged.  MRI results reviewed independently consistent with progressive myeloma.  No pathologic fractures reported.  Patient has completed palliative XRT.  Continue current narcotics as prescribed.    Peripheral neuropathy: Chronic and unchanged.  Continue gabapentin as prescribed. Insomnia/anxiety: Chronic and unchanged.  Patient is now having his medications adjusted by primary care.   Anemia: Hemoglobin improved to 12.1.   Hypocalcemia: Given patient's low albumin this corrects to near normal.  Proceed with Zometa as above. Pulmonary embolism: Patient is now on Eliquis.  Patient also confirms DNR/DNI.   Patient expressed understanding and was in agreement with this plan. He also understands that He can call clinic at any time with any questions, concerns, or complaints.    Jeralyn Ruths, MD   06/19/2022 10:17 AM

## 2022-06-20 LAB — KAPPA/LAMBDA LIGHT CHAINS
Kappa free light chain: 123.8 mg/L — ABNORMAL HIGH (ref 3.3–19.4)
Kappa, lambda light chain ratio: 10.96 — ABNORMAL HIGH (ref 0.26–1.65)
Lambda free light chains: 11.3 mg/L (ref 5.7–26.3)

## 2022-06-20 LAB — IGG, IGA, IGM
IgA: 67 mg/dL (ref 61–437)
IgG (Immunoglobin G), Serum: 2845 mg/dL — ABNORMAL HIGH (ref 603–1613)
IgM (Immunoglobulin M), Srm: 33 mg/dL (ref 15–143)

## 2022-06-23 ENCOUNTER — Inpatient Hospital Stay (HOSPITAL_BASED_OUTPATIENT_CLINIC_OR_DEPARTMENT_OTHER): Payer: Medicare Other | Admitting: Hospice and Palliative Medicine

## 2022-06-23 DIAGNOSIS — C9000 Multiple myeloma not having achieved remission: Secondary | ICD-10-CM

## 2022-06-23 NOTE — Progress Notes (Signed)
Virtual Visit via Telephone Note  I connected with Dean [name]. on 06/23/22 at  1:20 PM EDT by telephone and verified that I am speaking with the correct person using two identifiers.  Location: Patient: Home Provider: Clinic   I discussed the limitations, risks, security and privacy concerns of performing an evaluation and management service by telephone and the availability of in person appointments. I also discussed with the patient that there may be a patient responsible charge related to this service. The patient expressed understanding and agreed to proceed.   History of Present Illness: Dean Leonti. is a 87 y.o. male with multiple medical problems including multiple myeloma with disease progression currently on treatment.  Patient has had significant pain and declining performance status.  He has had recurrent falls including previous hip fracture.  Palliative care was consulted to address goals.    Observations/Objective: I called and spoke with patient by phone. Patient reports that he is doing well. He denies any changes or concerns. No acute symptomatic complaints at present. He says he is "having a good day."   Patient says pain is well controlled. He has not needed any oxycodone today.  Appetite is reportedly good.   Assessment and Plan: Multiple myeloma - on dose reduced Pomalyst. Symptoms appear well controlled. He has follow up next month with Dr. Orlie Dakin.   Neoplasm related pain - well controlled. Continue as needed oxycodone.   Follow Up Instructions: Follow up telephone visit in 2-3 months   I discussed the assessment and treatment plan with the patient. The patient was provided an opportunity to ask questions and all were answered. The patient agreed with the plan and demonstrated an understanding of the instructions.   The patient was advised to call back or seek an in-person evaluation if the symptoms worsen or if the condition fails to improve as  anticipated.  I provided 5 minutes of non-face-to-face time during this encounter.   Malachy Moan, NP

## 2022-06-26 ENCOUNTER — Telehealth: Payer: Self-pay | Admitting: Pharmacist

## 2022-06-26 NOTE — Progress Notes (Signed)
Care Management [facility] Coordination Services Pharmacy Team   Reason for Encounter: Hypertension   Contacted patient to discuss hypertension disease state. Spoke with family on 06/26/2022     Current antihypertensive regimen:  Furosemide (LASIX) 20 MG tablet now taking every other day   Patient verbally confirms he is taking the above medications as directed. Yes  How often are you checking your Blood Pressure? weekly  he checks his blood pressure in the morning before taking his medication.  Current home BP readings: 112/70 yesterday  DATE:             BP               PULSE   Wrist or arm cuff: Caffeine intake: Salt intake: OTC medications including pseudoephedrine or NSAIDs?  Any readings above 180/100? No If yes any symptoms of hypertensive emergency? patient denies any symptoms of high blood pressure  What recent interventions/DTPs have been made by any provider to improve Blood Pressure control since last CPP Visit:    Any recent hospitalizations or ED visits since last visit with CPP? Yes  What diet changes have been made to improve Blood Pressure Control?  Patient reported he has not required any special diet as his blood pressure has finally came up some.   What exercise is being done to improve your Blood Pressure Control?  Patient reported walking some with PT and his daughter when he is able.   Adherence Review: Is the patient currently on ACE/ARB medication? No Does the patient have >5 day gap between last estimated fill dates? No   Star Rating Drugs:  Medication:  Last Fill: Day Supply  No Star Rating Drugs Noted   Chart Updates: Recent office visits:  None noted  Recent consult visits:  06/23/22 Laurette Schimke, NP - Oncology - Multiple Myeloma - Oncology treatment administered. Follow up 2-3 months.  06/19/22 Gerarda Fraction MD - Oncology - Multiple Myeloma - Oncology treatment administered. Folic acid (FOLVITE) 1 MG tablet prescribed. Follow up  as scheduled.   05/19/22 Gerarda Fraction MD - Oncology - Multiple Myeloma - Oncology treatment administered. Folic acid (FOLVITE) 1 MG tablet prescribed. Follow up as scheduled.   04/21/22 Gerarda Fraction MD - Oncology - Multiple Myeloma - Oncology treatment administered. Referral to Social work. Follow up as scheduled.    Hospital visits:  02/10/22 - 02/13/22 Medication Reconciliation was completed by comparing discharge summary, patient's EMR and Pharmacy list, and upon discussion with patient.  Admitted to the hospital on 02/10/22 due to Acute Pulmonology embolism. Discharge date was 02/13/22. Discharged from [facility].    New?Medications Started at [facility] Discharge:?? apixaban (ELIQUIS) cefdinir (OMNICEF) cyanocobalamin (VITAMIN B12) folic acid (FOLVITE) metoprolol tartrate (LOPRESSOR)  Medication Changes at Hospital Discharge: dexamethasone (DECADRON)  Medications Discontinued at Hospital Discharge: amoxicillin-clavulanate 875-125 MG tablet (AUGMENTIN) ibuprofen 600 MG tablet (ADVIL) neomycin-polymyxin-hydrocortisone OTIC solution (CORTISPORIN) predniSONE 20 MG tablet (DELTASONE)  Medications that remain the same after Hospital Discharge:??  All other medications will remain the same.     Medications: Outpatient Encounter Medications as of 06/26/2022  Medication Sig   acyclovir (ZOVIRAX) 400 MG tablet Take 1 tablet (400 mg total) by mouth 2 (two) times daily.   ALPRAZolam (XANAX) 1 MG tablet Take 1 tablet (1 mg total) by mouth 3 (three) times daily as needed. for anxiety   amoxicillin (AMOXIL) 500 MG capsule Take 500 mg by mouth 2 (two) times daily. (Patient not taking: Reported on 05/19/2022)   apixaban (ELIQUIS) 5  MG TABS tablet TAKE TWO TABLETS BY MOUTH (10MG  TOTAL) TWICE A DAY. THEN TAKE ONE TABLET (5MG ) TWICE A DAY STARTING ON 02/19/22   CALCIUM-VITAMIN D PO Take 1 tablet by mouth 2 (two) times daily.    cyanocobalamin (VITAMIN B12) 500 MCG tablet Take 1 tablet  (500 mcg total) by mouth daily.   dexamethasone (DECADRON) 4 MG tablet Take 1 tablet (4 mg total) by mouth daily.   fluticasone (FLONASE) 50 MCG/ACT nasal spray PLACE 2 SPRAYS INTO BOTH NOSTRILS DAILY   folic acid (FOLVITE) 1 MG tablet Take 1 tablet (1 mg total) by mouth daily.   furosemide (LASIX) 20 MG tablet Take 1 tablet (20 mg total) by mouth daily as needed.   gabapentin (NEURONTIN) 400 MG capsule Take 1 capsule (400 mg total) by mouth 3 (three) times daily.   Homeopathic Products [facility] RELIEF EX) Apply topically as needed.   lidocaine-prilocaine (EMLA) cream Apply small amount over port one (1) hour prior to appointment.   montelukast (SINGULAIR) 10 MG tablet TAKE 1 TABLET BY MOUTH EVERY NIGHT AT BEDTIME   Oxycodone HCl 10 MG TABS TAKE ONE TO TWO TABLETS BY MOUTH THREE TIMES DAILY AS NEEDED   pomalidomide (POMALYST) 3 MG capsule Take 1 capsule (3 mg total) by mouth daily. Take for 21 days, then hold for 7 days. Repeat every 28 days.   tamsulosin (FLOMAX) 0.4 MG CAPS capsule TAKE ONE CAPSULE BY MOUTH EVERY NIGHT ATBEDTIME   traZODone (DESYREL) 100 MG tablet TAKE 3 TABLETS BY MOUTH EVERY NIGHT AT BEDTIME   TURMERIC PO Take 1 tablet by mouth 2 (two) times daily.   Facility-Administered Encounter Medications as of 06/26/2022  Medication   heparin lock flush 100 unit/mL    Recent Office Vitals: BP Readings from Last 3 Encounters:  06/19/22 113/72  05/19/22 (!) 142/70  04/21/22 126/62   Pulse Readings from Last 3 Encounters:  06/19/22 80  05/19/22 (!) 57  04/21/22 (!) 58    Wt Readings from Last 3 Encounters:  06/19/22 170 lb (77.1 kg)  05/19/22 167 lb 12.8 oz (76.1 kg)  03/20/22 165 lb 11.2 oz (75.2 kg)     Kidney Function Lab Results  Component Value Date/Time   CREATININE 0.78 06/19/2022 08:56 AM   CREATININE 0.69 05/19/2022 09:00 AM   CREATININE 0.51 (L) 03/20/2022 09:14 AM   CREATININE 0.81 03/26/2021 11:17 AM   CREATININE 1.00 03/05/2021 11:08 AM   GFRNONAA >60  06/19/2022 08:56 AM   GFRNONAA >60 03/20/2022 09:14 AM   GFRNONAA >89 02/16/2015 10:27 AM   GFRAA >60 10/13/2019 08:34 AM   GFRAA >89 02/16/2015 10:27 AM       Latest Ref Rng & Units 06/19/2022    8:56 AM 05/19/2022    9:00 AM 04/21/2022   10:20 AM  BMP  Glucose 70 - 99 mg/dL 086  96  88   BUN 8 - 23 mg/dL 25  29  26    Creatinine 0.61 - 1.24 mg/dL 5.78  4.69  6.29   Sodium 135 - 145 mmol/L 140  140  138   Potassium 3.5 - 5.1 mmol/L 3.8  4.0  3.7   Chloride 98 - 111 mmol/L 108  106  105   CO2 22 - 32 mmol/L 27  30  28    Calcium 8.9 - 10.3 mg/dL 8.2  8.3  8.3      Future Appointments  Date Time Provider Department Center  07/09/2022 11:00 AM [facility] 1 [facility] [place] [facility]  07/17/2022  9:00 AM [facility] None  07/17/2022  9:30 AM Jeralyn Ruths, MD [facility] None  07/17/2022 10:00 AM Remi Haggard, RPH-CPP [facility] None  07/17/2022 10:30 AM [facility] INFUSION CHAIR 12 [facility] None  09/29/2022  1:20 PM Borders, Daryl Eastern, NP [facility] None     [employer], [address]

## 2022-07-08 ENCOUNTER — Other Ambulatory Visit: Payer: Self-pay | Admitting: *Deleted

## 2022-07-08 ENCOUNTER — Telehealth: Payer: Self-pay | Admitting: Emergency Medicine

## 2022-07-08 DIAGNOSIS — C9000 Multiple myeloma not having achieved remission: Secondary | ICD-10-CM

## 2022-07-08 MED ORDER — POMALIDOMIDE 3 MG PO CAPS
3.0000 mg | ORAL_CAPSULE | Freq: Every day | ORAL | 0 refills | Status: DC
Start: 2022-07-08 — End: 2022-08-04

## 2022-07-09 ENCOUNTER — Other Ambulatory Visit: Payer: Self-pay

## 2022-07-09 ENCOUNTER — Ambulatory Visit (HOSPITAL_COMMUNITY)
Admission: RE | Admit: 2022-07-09 | Discharge: 2022-07-09 | Disposition: A | Discharge status: Home / Self Care | Payer: Medicare Other | Source: Ambulatory Visit | Attending: Radiation Oncology | Admitting: Radiation Oncology

## 2022-07-09 DIAGNOSIS — C7951 Secondary malignant neoplasm of bone: Secondary | ICD-10-CM | POA: Diagnosis not present

## 2022-07-09 DIAGNOSIS — C9000 Multiple myeloma not having achieved remission: Secondary | ICD-10-CM

## 2022-07-09 MED ORDER — GADOBUTROL 1 MMOL/ML IV SOLN
7.5000 mL | Freq: Once | INTRAVENOUS | Status: AC | PRN
  Administered 2022-07-09: 7.5 mL via INTRAVENOUS

## 2022-07-17 ENCOUNTER — Inpatient Hospital Stay: Payer: Medicare Other | Attending: Oncology

## 2022-07-17 ENCOUNTER — Inpatient Hospital Stay (HOSPITAL_BASED_OUTPATIENT_CLINIC_OR_DEPARTMENT_OTHER): Payer: Medicare Other | Admitting: Oncology

## 2022-07-17 ENCOUNTER — Inpatient Hospital Stay: Payer: Medicare Other

## 2022-07-17 ENCOUNTER — Inpatient Hospital Stay: Payer: Medicare Other | Admitting: Pharmacist

## 2022-07-17 ENCOUNTER — Encounter: Payer: Self-pay | Admitting: Oncology

## 2022-07-17 VITALS — BP 118/75 | HR 51 | Temp 97.6°F | Resp 19 | Wt 184.2 lb

## 2022-07-17 DIAGNOSIS — C9000 Multiple myeloma not having achieved remission: Secondary | ICD-10-CM

## 2022-07-17 LAB — CBC WITH DIFFERENTIAL/PLATELET
Abs Immature Granulocytes: 0.04 10*3/uL (ref 0.00–0.07)
Basophils Absolute: 0.1 10*3/uL (ref 0.0–0.1)
Basophils Relative: 1 %
Eosinophils Absolute: 0 10*3/uL (ref 0.0–0.5)
Eosinophils Relative: 1 %
HCT: 34.1 % — ABNORMAL LOW (ref 39.0–52.0)
Hemoglobin: 10.8 g/dL — ABNORMAL LOW (ref 13.0–17.0)
Immature Granulocytes: 1 %
Lymphocytes Relative: 29 %
Lymphs Abs: 1.1 10*3/uL (ref 0.7–4.0)
MCH: 30.7 pg (ref 26.0–34.0)
MCHC: 31.7 g/dL (ref 30.0–36.0)
MCV: 96.9 fL (ref 80.0–100.0)
Monocytes Absolute: 0.6 10*3/uL (ref 0.1–1.0)
Monocytes Relative: 16 %
Neutro Abs: 2 10*3/uL (ref 1.7–7.7)
Neutrophils Relative %: 52 %
Platelets: 152 10*3/uL (ref 150–400)
RBC: 3.52 MIL/uL — ABNORMAL LOW (ref 4.22–5.81)
RDW: 16.6 % — ABNORMAL HIGH (ref 11.5–15.5)
WBC: 3.8 10*3/uL — ABNORMAL LOW (ref 4.0–10.5)
nRBC: 0 % (ref 0.0–0.2)

## 2022-07-17 LAB — COMPREHENSIVE METABOLIC PANEL
ALT: 12 U/L (ref 0–44)
AST: 20 U/L (ref 15–41)
Albumin: 2.5 g/dL — ABNORMAL LOW (ref 3.5–5.0)
Alkaline Phosphatase: 22 U/L — ABNORMAL LOW (ref 38–126)
Anion gap: 7 (ref 5–15)
BUN: 20 mg/dL (ref 8–23)
CO2: 24 mmol/L (ref 22–32)
Calcium: 8 mg/dL — ABNORMAL LOW (ref 8.9–10.3)
Chloride: 108 mmol/L (ref 98–111)
Creatinine, Ser: 0.66 mg/dL (ref 0.61–1.24)
GFR, Estimated: >60 mL/min (ref >60)
Glucose, Bld: 120 mg/dL — ABNORMAL HIGH (ref 70–99)
Potassium: 3.5 mmol/L (ref 3.5–5.1)
Sodium: 139 mmol/L (ref 135–145)
Total Bilirubin: 0.3 mg/dL (ref 0.3–1.2)
Total Protein: 6.8 g/dL (ref 6.5–8.1)

## 2022-07-17 MED ORDER — ZOLEDRONIC ACID 4 MG/100ML IV SOLN
4.0000 mg | Freq: Once | INTRAVENOUS | Status: AC
  Administered 2022-07-17: 4 mg via INTRAVENOUS
  Filled 2022-07-17: qty 100

## 2022-07-17 MED ORDER — HEPARIN SOD (PORK) LOCK FLUSH 100 UNIT/ML IV SOLN
500.0000 [IU] | Freq: Once | INTRAVENOUS | Status: AC | PRN
  Administered 2022-07-17: 500 [IU]
  Filled 2022-07-17: qty 5

## 2022-07-17 MED ORDER — SODIUM CHLORIDE 0.9 % IV SOLN
Freq: Once | INTRAVENOUS | Status: AC
  Filled 2022-07-17: qty 250

## 2022-07-17 NOTE — Progress Notes (Signed)
[facility]  [phone] [phone]  ID: Dean [name]. OB: 04-27-1935  MR#: [id number]  NUU#:[id number]  Patient Care Team: Donita Brooks, MD as PCP - General (Family Medicine) Antony Blackbird, MD as Consulting Physician (Radiation Oncology) West Bali, MD (Inactive) as Consulting Physician (Gastroenterology) Karie Soda, MD as Consulting Physician (General Surgery) Jeralyn Ruths, MD as Consulting Physician (Oncology)  CHIEF COMPLAINT: Multiple myeloma  INTERVAL HISTORY: Patient returns to clinic today for repeat laboratory work, further evaluation, and continuation of Pomalyst and Zometa.  He reports his strength is significantly improved with physical therapy and he has had no recent falls.  He continues to have chronic pain. He denies any fevers.  He has no neurologic complaints.  He denies any fevers.  His appetite has improved and he has gained weight.  He has no chest pain, shortness of breath, cough, or hemoptysis.  He denies any nausea, vomiting, constipation, or diarrhea.  He has no urinary complaints.  Patient offers no further specific complaints today.  REVIEW OF SYSTEMS:   Review of Systems  Constitutional: Negative.  Negative for fever, malaise/fatigue and weight loss.  Respiratory: Negative.  Negative for cough, hemoptysis and shortness of breath.   Cardiovascular: Negative.  Negative for chest pain and leg swelling.  Gastrointestinal: Negative.  Negative for abdominal pain and nausea.  Genitourinary: Negative.  Negative for dysuria and flank pain.  Musculoskeletal:  Positive for back pain and joint pain. Negative for falls.  Skin: Negative.  Negative for rash.  Neurological:  Positive for sensory change. Negative for dizziness, focal weakness, weakness and headaches.  Psychiatric/Behavioral: Negative.  The patient is not nervous/anxious and does not have insomnia.     As per HPI. Otherwise, a complete review of  systems is negative.  PAST MEDICAL HISTORY: Past Medical History:  Diagnosis Date   BPH (benign prostatic hyperplasia)    Colonic diverticular abscess 01/08/2015   Diverticulitis 12/22/2014   complicated by abscess and required percutaneous drainage   GERD (gastroesophageal reflux disease)    H/O ETOH abuse    History of chemotherapy last done jan 2017   History of radiation therapy 01/05/13-02/10/13   45 gray to left occipital condyle region   History of radiation therapy 12/01/16-12/10/16   Parasternal nodule, chest- 24 Gy total delivered in 8 fractions, Left sacro-iliac, pelvis- 24 Gy total delivered in 8 fractions    History of radiation therapy 02/19/17-02/22/17   right temporal scalp 30 Gy in 10 fractions   History of radiation therapy    Right hip( Pelvis) 11/18/21-11/21/21-Dr. Antony Blackbird   Intra-abdominal abscess (HCC)    Multiple myeloma (HCC) 2015   Neuropathy    Radiation 02/21/14-03/08/14   right posterior chest wall area 30 gray   Radiation 04/19/14-05/02/14   lumbar spine 25 gray   Skull lesion    Left occipital condyle   Wrist fracture, left    x 2    PAST SURGICAL HISTORY: Past Surgical History:  Procedure Laterality Date   COLONOSCOPY N/A 04/19/2015   Procedure: COLONOSCOPY;  Surgeon: West Bali, MD;  Location: [facility];  Service: Endoscopy;  Laterality: N/A;  1:30 PM   CYST EXCISION  1959   tail bone   IR IMAGING GUIDED PORT INSERTION  09/25/2017    FAMILY HISTORY: Family History  Problem Relation Age of Onset   Diabetes Mother    Stroke Mother    Hypertension Father     ADVANCED DIRECTIVES (Y/N):  N  HEALTH MAINTENANCE: Social History   Tobacco Use   Smoking status: Never   Smokeless tobacco: Never  Vaping Use   Vaping Use: Never used  Substance Use Topics   Alcohol use: No    Comment: " Not much no more"   Drug use: No     Colonoscopy:  PAP:  Bone density:  Lipid panel:  Allergies  Allergen Reactions   Codeine Other (See  Comments)    Headache   Morphine And Codeine Other (See Comments)    Makes him feel weird   Revlimid [Lenalidomide] Other (See Comments)    "Causes me to become weak"    Current Outpatient Medications  Medication Sig Dispense Refill   acyclovir (ZOVIRAX) 400 MG tablet Take 1 tablet (400 mg total) by mouth 2 (two) times daily. 60 tablet 6   ALPRAZolam (XANAX) 1 MG tablet Take 1 tablet (1 mg total) by mouth 3 (three) times daily as needed. for anxiety 90 tablet 0   apixaban (ELIQUIS) 5 MG TABS tablet TAKE TWO TABLETS BY MOUTH (10MG  TOTAL) TWICE A DAY. THEN TAKE ONE TABLET (5MG ) TWICE A DAY STARTING ON 02/19/22 60 tablet 1   CALCIUM-VITAMIN D PO Take 1 tablet by mouth 2 (two) times daily.      cyanocobalamin (VITAMIN B12) 500 MCG tablet Take 1 tablet (500 mcg total) by mouth daily.     dexamethasone (DECADRON) 4 MG tablet Take 1 tablet (4 mg total) by mouth daily. 30 tablet 0   fluticasone (FLONASE) 50 MCG/ACT nasal spray PLACE 2 SPRAYS INTO BOTH NOSTRILS DAILY 16 g 6   folic acid (FOLVITE) 1 MG tablet Take 1 tablet (1 mg total) by mouth daily. 90 tablet 1   furosemide (LASIX) 20 MG tablet Take 1 tablet (20 mg total) by mouth daily as needed. 90 tablet 1   gabapentin (NEURONTIN) 400 MG capsule Take 1 capsule (400 mg total) by mouth 3 (three) times daily. 90 capsule 4   Homeopathic Products (THERAWORX RELIEF EX) Apply topically as needed.     lidocaine-prilocaine (EMLA) cream Apply small amount over port one (1) hour prior to appointment. 30 g 0   montelukast (SINGULAIR) 10 MG tablet TAKE 1 TABLET BY MOUTH EVERY NIGHT AT BEDTIME 30 tablet 3   Oxycodone HCl 10 MG TABS TAKE ONE TO TWO TABLETS BY MOUTH THREE TIMES DAILY AS NEEDED 90 tablet 0   pomalidomide (POMALYST) 3 MG capsule Take 1 capsule (3 mg total) by mouth daily. Take for 21 days, then hold for 7 days. Repeat every 28 days. 21 capsule 0   tamsulosin (FLOMAX) 0.4 MG CAPS capsule TAKE ONE CAPSULE BY MOUTH EVERY NIGHT ATBEDTIME 30 capsule 3    traZODone (DESYREL) 100 MG tablet TAKE 3 TABLETS BY MOUTH EVERY NIGHT AT BEDTIME 90 tablet 1   TURMERIC PO Take 1 tablet by mouth 2 (two) times daily.     amoxicillin (AMOXIL) 500 MG capsule Take 500 mg by mouth 2 (two) times daily. (Patient not taking: Reported on 05/19/2022)     No current facility-administered medications for this visit.   Facility-Administered Medications Ordered in Other Visits  Medication Dose Route Frequency Provider Last Rate Last Admin   heparin lock flush 100 unit/mL  500 Units Intravenous Once Jeralyn Ruths, MD        OBJECTIVE: Vitals:   07/17/22 0906  BP: 118/75  Pulse: (!) 51  Resp: 19  Temp: 97.6 F (36.4 C)  SpO2: 98%      Body  mass index is 28.01 kg/m.    ECOG FS:1 - Symptomatic but completely ambulatory  General: Well-developed, well-nourished, no acute distress.  Sitting in a wheelchair. Eyes: Pink conjunctiva, anicteric sclera. HEENT: Normocephalic, moist mucous membranes. Lungs: No audible wheezing or coughing. Heart: Regular rate and rhythm. Abdomen: Soft, nontender, no obvious distention. Musculoskeletal: No edema, cyanosis, or clubbing. Neuro: Alert, answering all questions appropriately. Cranial nerves grossly intact. Skin: No rashes or petechiae noted. Psych: Normal affect.   LAB RESULTS:  Lab Results  Component Value Date   NA 139 07/17/2022   K 3.5 07/17/2022   CL 108 07/17/2022   CO2 24 07/17/2022   GLUCOSE 120 (H) 07/17/2022   BUN 20 07/17/2022   CREATININE 0.66 07/17/2022   CALCIUM 8.0 (L) 07/17/2022   PROT 6.8 07/17/2022   ALBUMIN 2.5 (L) 07/17/2022   AST 20 07/17/2022   ALT 12 07/17/2022   ALKPHOS 22 (L) 07/17/2022   BILITOT 0.3 07/17/2022   GFRNONAA >60 07/17/2022   GFRAA >60 10/13/2019    Lab Results  Component Value Date   WBC 3.8 (L) 07/17/2022   NEUTROABS 2.0 07/17/2022   HGB 10.8 (L) 07/17/2022   HCT 34.1 (L) 07/17/2022   MCV 96.9 07/17/2022   PLT 152 07/17/2022     STUDIES: MR HIP  LEFT W WO CONTRAST  Result Date: 07/14/2022 CLINICAL DATA:  Musculoskeletal neoplasm. Multiple myeloma without remission. Assess treatment response. EXAM: MRI OF THE LEFT HIP WITHOUT AND WITH CONTRAST TECHNIQUE: Multiplanar, multisequence MR imaging was performed both before and after administration of intravenous contrast. CONTRAST:  7.69mL GADAVIST GADOBUTROL 1 MMOL/ML IV SOLN COMPARISON:  Pelvis and right hip radiographs 12/04/2021, pelvis and left hip radiographs 08/18/2021, MRI right hip 08/01/2021; whole-body bone scan 09/19/2021 FINDINGS: Bones: Compared to 08/01/2021, there is interval progression in numerous destructive, decreased T1 and intermediate to increased T2 signal lesions throughout the visualized pelvis, proximal femurs, and distal lumbar spine. Postcontrast images were only performed of the coned-down left and right hip regions, and there is predominantly peripheral enhancement of the destructive lesions within the bilateral proximal femurs and acetabula on these sequences. There is more uniform enhancement seen within the partially visualized destructive lesions within the bilateral superior pubic rami. There is increased size of a reference lesion within the anterior superior aspect of the right iliac crest, now with multiple lobular components extending outside the bone (coronal image 16/30) where the conglomeration of lesions measure up to approximately 3.1 x 4.1 cm compared to 1.5 x 3.3 cm (transverse by craniocaudal) on 08/01/2021. There is interval increase in size of multiple posterior right iliac lesions that now partially contact each other (large field-of-view coronal images 2 through 9). New adjacent right hemisacrum destructive lesion measuring up to 3.2 cm in craniocaudal dimension (large field-of-view coronal image 5). Interval increase in size of multiple left superior iliac lesions. Conglomeration of lesions within the mid to anterior superior left ilium measure up to  approximately 3.1 x 4.9 cm (large field-of-view coronal image 17, transverse by craniocaudal), increased from 2.1 x 2.4 cm on 08/01/2021. There is mildly worsened marked contiguous involvement of lesions throughout the left superior pubic ramus. There is again complete involvement of the visualized proximal left femoral diaphysis. Bilateral proximal femoral intertrochanteric and distal femoral neck lesions are again noted. There is increased, now near fluid bright signal within the majority of the central aspect of the bilateral proximal femoral lesions and an elongated right inferior pubic ramus (axial series 2, image 30) lesion, possible  necrosis. The dominant proximal right femoral lesions appear not significantly changed in size. There is mild decrease in size of a dominant left intertrochanteric lesion measuring up to approximately 2.5 x 2.5 x 3.4 cm (transverse by AP by craniocaudal, axial series 10, image 28 and coronal series 9, image 11), compared to 3.1 x 2.8 x 3.9 cm on 08/01/2021. Articular cartilage and labrum Right hip: Articular cartilage: There is again severe superior right femoroacetabular osteoarthritis including full-thickness cartilage loss and moderate cortical flattening. Labrum: Degenerative tears and attenuation throughout the severe right acetabular labrum. Left hip: Articular cartilage: Mild high-grade left humerus osteoarthritis including diffuse superior full-thickness cartilage loss. Labrum: Peripheral degenerative fraying at the superior left acetabular labrum. Joint or bursal effusion Joint effusion: Moderate right femoroacetabular joint effusion, similar to prior. Bursae: No trochanteric bursitis on either side. Muscles and tendons Muscles and tendons: The origins of the bilateral rectus femoris tendons are intact. Mild bilateral intermediate T2 signal common hamstring origin tendinosis is similar to prior. Mild right gluteus minimus and medius muscle edema, likely muscle strain.  There is left greater than right gluteus minimus muscle atrophy again seen. Resolution of the previously seen fluid around the distal left gluteus minimus tendon insertion, previously suggesting a partial-thickness tear. No definite tear is seen within the right gluteus minimus or either gluteus medius tendon insertion. Mild left iliopsoas intermediate T2 signal tendinosis. Interval increase in decreased T1 increased T2 signal within the right-greater-than-left iliacus muscles, suggesting myelomatous involvement (axial series 10, image 1 and coronal series 9, image 13). Mildly increased mild-to-moderate right and mildly decreased minimal left groin adductor muscle edema. Other findings Miscellaneous: The prostate is again enlarged and heterogeneous, measuring up to approximately 6.0 cm in transverse dimension. It again mildly to moderately impresses on the base the urinary bladder. There are again numerous bladder diverticula. IMPRESSION: Compared to 08/01/2021: 1. Overall interval progression of numerous destructive bone lesions throughout the visualized pelvis, proximal femurs, and distal lumbar spine consistent with known myelomatous involvement. There is increased, now near fluid bright signal within the majority of the bilateral proximal femoral lesions and an elongated lesion within the right inferior pubic ramus, possible necrosis. 2. There is again complete involvement of the visualized proximal left femoral diaphysis. There is again involvement of the bilateral femoral intertrochanteric regions which increases risk for pathologic fracture, however the bilateral proximal femoral lesions actually appeared to subjectively be stable to decreased in size compared to 08/01/2021. No acute pathologic fracture is visualized. 3. 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 4. Interval increase in right-greater-than-left iliacus muscle myelomatous involvement. 5. Redemonstration  of enlarged prostate gland with mild to moderate impression on the base of the urinary bladder. Numerous bladder diverticula are again noted. Findings are again consistent with sequela of partial bladder outlet obstruction. Electronically Signed   By: Neita Garnet M.D.   On: 07/14/2022 09:48   MR HIP RIGHT W WO CONTRAST  Result Date: 07/14/2022 CLINICAL DATA:  Musculoskeletal neoplasm. Multiple myeloma without remission. Assess treatment response. EXAM: MRI OF THE LEFT HIP WITHOUT AND WITH CONTRAST TECHNIQUE: Multiplanar, multisequence MR imaging was performed both before and after administration of intravenous contrast. CONTRAST:  7.83mL GADAVIST GADOBUTROL 1 MMOL/ML IV SOLN COMPARISON:  Pelvis and right hip radiographs 12/04/2021, pelvis and left hip radiographs 08/18/2021, MRI right hip 08/01/2021; whole-body bone scan 09/19/2021 FINDINGS: Bones: Compared to 08/01/2021, there is interval progression in numerous destructive, decreased T1 and intermediate to increased T2 signal lesions throughout the visualized  pelvis, proximal femurs, and distal lumbar spine. Postcontrast images were only performed of the coned-down left and right hip regions, and there is predominantly peripheral enhancement of the destructive lesions within the bilateral proximal femurs and acetabula on these sequences. There is more uniform enhancement seen within the partially visualized destructive lesions within the bilateral superior pubic rami. There is increased size of a reference lesion within the anterior superior aspect of the right iliac crest, now with multiple lobular components extending outside the bone (coronal image 16/30) where the conglomeration of lesions measure up to approximately 3.1 x 4.1 cm compared to 1.5 x 3.3 cm (transverse by craniocaudal) on 08/01/2021. There is interval increase in size of multiple posterior right iliac lesions that now partially contact each other (large field-of-view coronal images 2 through  9). New adjacent right hemisacrum destructive lesion measuring up to 3.2 cm in craniocaudal dimension (large field-of-view coronal image 5). Interval increase in size of multiple left superior iliac lesions. Conglomeration of lesions within the mid to anterior superior left ilium measure up to approximately 3.1 x 4.9 cm (large field-of-view coronal image 17, transverse by craniocaudal), increased from 2.1 x 2.4 cm on 08/01/2021. There is mildly worsened marked contiguous involvement of lesions throughout the left superior pubic ramus. There is again complete involvement of the visualized proximal left femoral diaphysis. Bilateral proximal femoral intertrochanteric and distal femoral neck lesions are again noted. There is increased, now near fluid bright signal within the majority of the central aspect of the bilateral proximal femoral lesions and an elongated right inferior pubic ramus (axial series 2, image 30) lesion, possible necrosis. The dominant proximal right femoral lesions appear not significantly changed in size. There is mild decrease in size of a dominant left intertrochanteric lesion measuring up to approximately 2.5 x 2.5 x 3.4 cm (transverse by AP by craniocaudal, axial series 10, image 28 and coronal series 9, image 11), compared to 3.1 x 2.8 x 3.9 cm on 08/01/2021. Articular cartilage and labrum Right hip: Articular cartilage: There is again severe superior right femoroacetabular osteoarthritis including full-thickness cartilage loss and moderate cortical flattening. Labrum: Degenerative tears and attenuation throughout the severe right acetabular labrum. Left hip: Articular cartilage: Mild high-grade left humerus osteoarthritis including diffuse superior full-thickness cartilage loss. Labrum: Peripheral degenerative fraying at the superior left acetabular labrum. Joint or bursal effusion Joint effusion: Moderate right femoroacetabular joint effusion, similar to prior. Bursae: No trochanteric  bursitis on either side. Muscles and tendons Muscles and tendons: The origins of the bilateral rectus femoris tendons are intact. Mild bilateral intermediate T2 signal common hamstring origin tendinosis is similar to prior. Mild right gluteus minimus and medius muscle edema, likely muscle strain. There is left greater than right gluteus minimus muscle atrophy again seen. Resolution of the previously seen fluid around the distal left gluteus minimus tendon insertion, previously suggesting a partial-thickness tear. No definite tear is seen within the right gluteus minimus or either gluteus medius tendon insertion. Mild left iliopsoas intermediate T2 signal tendinosis. Interval increase in decreased T1 increased T2 signal within the right-greater-than-left iliacus muscles, suggesting myelomatous involvement (axial series 10, image 1 and coronal series 9, image 13). Mildly increased mild-to-moderate right and mildly decreased minimal left groin adductor muscle edema. Other findings Miscellaneous: The prostate is again enlarged and heterogeneous, measuring up to approximately 6.0 cm in transverse dimension. It again mildly to moderately impresses on the base the urinary bladder. There are again numerous bladder diverticula. IMPRESSION: Compared to 08/01/2021: 1. Overall interval progression of numerous  destructive bone lesions throughout the visualized pelvis, proximal femurs, and distal lumbar spine consistent with known myelomatous involvement. There is increased, now near fluid bright signal within the majority of the bilateral proximal femoral lesions and an elongated lesion within the right inferior pubic ramus, possible necrosis. 2. There is again complete involvement of the visualized proximal left femoral diaphysis. There is again involvement of the bilateral femoral intertrochanteric regions which increases risk for pathologic fracture, however the bilateral proximal femoral lesions actually appeared to  subjectively be stable to decreased in size compared to 08/01/2021. No acute pathologic fracture is visualized. 3. 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 4. Interval increase in right-greater-than-left iliacus muscle myelomatous involvement. 5. Redemonstration of enlarged prostate gland with mild to moderate impression on the base of the urinary bladder. Numerous bladder diverticula are again noted. Findings are again consistent with sequela of partial bladder outlet obstruction. Electronically Signed   By: Neita Garnet M.D.   On: 07/14/2022 09:48    ASSESSMENT: Multiple myeloma  PLAN:  Multiple myeloma: Please see note from September 17, 2020 for historic details of his myeloma and treatment.  Since reinitiating daratumumab, patient's M spike trended down to 3.6, but recently has increased back to 5.3.  He was switched to single agent Pomalyst and his most recent M spike is stable at 1.3.  IgG and kappa free light chains have slowly trended up.  We previously discussed discontinuing treatment and hospice, but patient wishes to pursue Pomalyst.  He expressed understanding that there are likely no other treatments available if Pomalyst were no longer working.  Proceed with dose reduced Pomalyst 3 mg daily for 21 days with 7 days off.  Patient will also receive Zometa today.  Given patient's improved performance status and increasing myeloma numbers, will increase Pomalyst to 4 mg daily for 21 days and 7 days off with the next cycle.  Return to clinic in 4 weeks for further evaluation and continuation of treatment.  Appreciate clinical pharmacy input.   Back/joint pain: Chronic and she imaged.  MRI results reviewed independently consistent with progressive myeloma.  No pathologic fractures reported.  Patient has completed palliative XRT.  Continue current narcotics as prescribed.    Peripheral neuropathy: Chronic and unchanged.  Continue gabapentin as  prescribed. Insomnia/anxiety: Chronic and unchanged.  Patient is now having his medications adjusted by primary care.   Anemia: Hemoglobin has trended down slightly to 10.8. Hypocalcemia: Given patient's low albumin this corrects to near normal.  Proceed with Zometa as above. Pulmonary embolism: Patient is now on Eliquis.  Patient also confirms DNR/DNI.  I spent a total of 30 minutes reviewing chart data, face-to-face evaluation with the patient, counseling and coordination of care as detailed above.    Patient expressed understanding and was in agreement with this plan. He also understands that He can call clinic at any time with any questions, concerns, or complaints.    Jeralyn Ruths, MD   07/17/2022 10:01 AM

## 2022-07-17 NOTE — Progress Notes (Signed)
Oral Chemotherapy Clinic [facility]  [phone] [phone]  Patient Care Team: Donita Brooks, MD as PCP - General (Family Medicine) Antony Blackbird, MD as Consulting Physician (Radiation Oncology) West Bali, MD (Inactive) as Consulting Physician (Gastroenterology) Karie Soda, MD as Consulting Physician (General Surgery) Jeralyn Ruths, MD as Consulting Physician (Oncology)   Name of the patient: Dean [name]  [id number]  Aug 22, 1935   Date of visit: 07/17/22  HPI: Patient is a 87 y.o. male with multiple myeloma, currently treated with pomalidomide. Started in December 2023.  Reason for Consult: Oral chemotherapy follow-up for pomalidomide therapy.   PAST MEDICAL HISTORY: Past Medical History:  Diagnosis Date   BPH (benign prostatic hyperplasia)    Colonic diverticular abscess 01/08/2015   Diverticulitis 12/22/2014   complicated by abscess and required percutaneous drainage   GERD (gastroesophageal reflux disease)    H/O ETOH abuse    History of chemotherapy last done jan 2017   History of radiation therapy 01/05/13-02/10/13   45 gray to left occipital condyle region   History of radiation therapy 12/01/16-12/10/16   Parasternal nodule, chest- 24 Gy total delivered in 8 fractions, Left sacro-iliac, pelvis- 24 Gy total delivered in 8 fractions    History of radiation therapy 02/19/17-02/22/17   right temporal scalp 30 Gy in 10 fractions   History of radiation therapy    Right hip( Pelvis) 11/18/21-11/21/21-Dr. Antony Blackbird   Intra-abdominal abscess [facility])    Multiple myeloma (HCC) 2015   Neuropathy    Radiation 02/21/14-03/08/14   right posterior chest wall area 30 gray   Radiation 04/19/14-05/02/14   lumbar spine 25 gray   Skull lesion    Left occipital condyle   Wrist fracture, left    x 2    HEMATOLOGY/ONCOLOGY HISTORY:  Oncology History  Multiple myeloma without remission (HCC)  11/16/2012 Initial Diagnosis   L  occipital condyle destructive lesion, not biopsied secondary to location. XRT   12/01/2012 Imaging   L3 superior endplate compression FX, no lytic lesions to suggest myeloma   02/06/2014 Imaging   soft tissue mass along the right posterior fifth rib with some rib destruction   02/16/2014 Miscellaneous   Normal CBC, Normal CMP, kappa,lamda ratio of 2.62 (abnormal), UIEP with slightly restricted band, IgG kappa, SPEP/IEP with monoclonal protein at 0.79 g/dl, normal igG, suppressed IGM   02/20/2014 Bone Marrow Biopsy   33% kappa restricted plasma cells Normal FISH, normal cytogenetics   02/21/2014 - 03/08/2014 Radiation Therapy   30Gy to R rib lesion, lesion not biopsied   03/16/2014 PET scan   Scattered hypermetabolic osseous lesions, including the calvarium, ribs, left scapula, sacrum, and left femoral shaft. An expansile left vertebral body lesion at L3 extends into the epidural space of the left lateral recess,    03/17/2014 - 04/07/2014 Chemotherapy   Velcade and Dexamethasone due to initial denial of Revlimid by insurance company.  Zometa monthly.   03/22/2014 Imaging   MR_ L-Spine- Enhancing lesions compatible with multiple myeloma at L2, L3, and S1-2.   04/07/2014 - 07/28/2014 Chemotherapy   RVD with Revlimid at 25 mg days 1-14.  Revlimid dose reduced to 25 mg every other day x 14 days with 7 day respite beginning on day 1 of cycle 2.   04/17/2014 Adverse Reaction   Revlimid-induced rash.  Treated with steroids.   07/28/2014 - 08/04/2014 Chemotherapy   Revlimid daily   08/04/2014 Adverse Reaction   Velcade-induced peripheral neuropathy   08/04/2014 Treatment Plan Change  D/C Velcade   08/11/2014 PET scan   Response to therapy. Improvement and resolution of foci of osseous hypermetabolism.   08/22/2014 - 08/28/2014 Chemotherapy   Revlimid 25 mg days 1-21 every 28 days   08/28/2014 Adverse Reaction   Revlimid-induced rash. Medication held.  Medrol dose Pak prescribed.   09/04/2014 - 01/08/2015  Chemotherapy   Revlimid 10 mg every other day, without dexamethasone   01/08/2015 - 01/15/2015 Hospital Admission   Colonic diverticular abscess with IR drain placement   03/02/2015 PET scan   Only bony uptake is low activity at site of deformity/callus at R second rib FX, high activity in sigmoid colon, advanced sigmoid divertidulosis. Abscess not resolved?   06/04/2015 Imaging   CT nonobstructive L renal calculus, diverticulosis of descending and sigmoid colon without inflammation, moderate prostatic enlargement   07/12/2015 Surgery   Diverticulitis s/p robotic sigmoid colectomy with Dr. Michaell Cowing   08/23/2015 PET scan   Primarily similar hypermetabolic osseous foci within the R sided ribs. new L third rib focus of hypermetab and sclerosis is favored to be related to healing FX. no soft tissue myeloma id'd. Presumed postop hypermetab and edema about the R pelvic wall    03/06/2016 PET scan   1. Reduced activity in the prior lesion such as the right second and fifth rib lesions, activity nearly completely resolved and significantly less than added mediastinal blood pool activity. No new lesions are identified. 2. Other imaging findings of potential clinical significance: Coronary, aortic arch, and branch vessel atherosclerotic vascular disease. Aortoiliac atherosclerotic vascular disease. Enlarged prostate gland. Colonic diverticula.   02/17/2017 PET scan   HEAD/NECK: No hypermetabolic activity in the scalp. No hypermetabolic cervical lymph nodes. CHEST: No hypermetabolic mediastinal or hilar nodes. Right upper lobe scarring/atelectasis. No suspicious pulmonary nodules on the CT scan. ABDOMEN/PELVIS: No abnormal hypermetabolic activity within the liver, pancreas, adrenal glands, or spleen. No hypermetabolic lymph nodes in the abdomen or pelvis. Atherosclerotic calcifications of the abdominal aorta and branch vessels. Left renal sinus cysts. Sigmoid diverticulosis, without evidence of  diverticulitis. Prostatomegaly. SKELETON: Vague hypermetabolism involving the inferior sternum, max SUV 3.2, previously 8.2. Focal hypermetabolism involving the left sacrum, max SUV 4.0, previously 6.7. EXTREMITIES: No abnormal hypermetabolic activity in the lower extremities.   02/18/2017 -  Chemotherapy   Bortezomib 1.3mg /m2 QWk + Dexamethasone 10mg  QTue/Fri --Cycle #1, 02/18/17    02/18/2017 - 08/26/2017 Chemotherapy   The patient had bortezomib SQ (VELCADE) chemo injection 3 mg, 1.3 mg/m2 = 3 mg, Subcutaneous,  Once, 7 of 9 cycles Administration: 3 mg (02/18/2017), 3 mg (02/26/2017), 3 mg (03/04/2017), 3 mg (03/11/2017), 3 mg (04/08/2017), 3 mg (03/18/2017), 3 mg (03/25/2017), 3 mg (04/01/2017), 3 mg (05/06/2017), 3 mg (05/20/2017), 3 mg (06/03/2017), 3 mg (06/17/2017), 3 mg (07/01/2017), 3 mg (07/15/2017), 3 mg (07/29/2017), 3 mg (08/12/2017), 3 mg (08/26/2017)  for chemotherapy treatment.    09/17/2017 - 10/02/2021 Chemotherapy   Patient is on Treatment Plan : MYELOMA Daratumumab q28d     09/17/2017 - 12/04/2021 Chemotherapy   Patient is on Treatment Plan : MYELOMA Daratumumab SQ q28d       ALLERGIES:  is allergic to codeine, morphine and codeine, and revlimid [lenalidomide].  MEDICATIONS:  Current Outpatient Medications  Medication Sig Dispense Refill   acyclovir (ZOVIRAX) 400 MG tablet Take 1 tablet (400 mg total) by mouth 2 (two) times daily. 60 tablet 6   ALPRAZolam (XANAX) 1 MG tablet Take 1 tablet (1 mg total) by mouth 3 (three) times daily as needed. for  anxiety 90 tablet 0   amoxicillin (AMOXIL) 500 MG capsule Take 500 mg by mouth 2 (two) times daily. (Patient not taking: Reported on 05/19/2022)     apixaban (ELIQUIS) 5 MG TABS tablet TAKE TWO TABLETS BY MOUTH (10MG  TOTAL) TWICE A DAY. THEN TAKE ONE TABLET (5MG ) TWICE A DAY STARTING ON 02/19/22 60 tablet 1   CALCIUM-VITAMIN D PO Take 1 tablet by mouth 2 (two) times daily.      cyanocobalamin (VITAMIN B12) 500 MCG tablet Take 1 tablet (500 mcg total)  by mouth daily.     dexamethasone (DECADRON) 4 MG tablet Take 1 tablet (4 mg total) by mouth daily. 30 tablet 0   fluticasone (FLONASE) 50 MCG/ACT nasal spray PLACE 2 SPRAYS INTO BOTH NOSTRILS DAILY 16 g 6   folic acid (FOLVITE) 1 MG tablet Take 1 tablet (1 mg total) by mouth daily. 90 tablet 1   furosemide (LASIX) 20 MG tablet Take 1 tablet (20 mg total) by mouth daily as needed. 90 tablet 1   gabapentin (NEURONTIN) 400 MG capsule Take 1 capsule (400 mg total) by mouth 3 (three) times daily. 90 capsule 4   Homeopathic Products (THERAWORX RELIEF EX) Apply topically as needed.     lidocaine-prilocaine (EMLA) cream Apply small amount over port one (1) hour prior to appointment. 30 g 0   montelukast (SINGULAIR) 10 MG tablet TAKE 1 TABLET BY MOUTH EVERY NIGHT AT BEDTIME 30 tablet 3   Oxycodone HCl 10 MG TABS TAKE ONE TO TWO TABLETS BY MOUTH THREE TIMES DAILY AS NEEDED 90 tablet 0   pomalidomide (POMALYST) 3 MG capsule Take 1 capsule (3 mg total) by mouth daily. Take for 21 days, then hold for 7 days. Repeat every 28 days. 21 capsule 0   tamsulosin (FLOMAX) 0.4 MG CAPS capsule TAKE ONE CAPSULE BY MOUTH EVERY NIGHT ATBEDTIME 30 capsule 3   traZODone (DESYREL) 100 MG tablet TAKE 3 TABLETS BY MOUTH EVERY NIGHT AT BEDTIME 90 tablet 1   TURMERIC PO Take 1 tablet by mouth 2 (two) times daily.     No current facility-administered medications for this visit.   Facility-Administered Medications Ordered in Other Visits  Medication Dose Route Frequency Provider Last Rate Last Admin   heparin lock flush 100 unit/mL  500 Units Intravenous Once Jeralyn Ruths, MD        VITAL SIGNS: There were no vitals taken for this visit. There were no vitals filed for this visit.  Estimated body mass index is 28.01 kg/m as calculated from the following:   Height as of 06/19/22: 5\' 8"  (1.727 m).   Weight as of an earlier encounter on 07/17/22: 83.6 kg (184 lb 3.2 oz).  LABS: CBC:    Component Value Date/Time    WBC 3.8 (L) 07/17/2022 0843   HGB 10.8 (L) 07/17/2022 0843   HCT 34.1 (L) 07/17/2022 0843   PLT 152 07/17/2022 0843   MCV 96.9 07/17/2022 0843   NEUTROABS 2.0 07/17/2022 0843   LYMPHSABS 1.1 07/17/2022 0843   MONOABS 0.6 07/17/2022 0843   EOSABS 0.0 07/17/2022 0843   BASOSABS 0.1 07/17/2022 0843   Comprehensive Metabolic Panel:    Component Value Date/Time   NA 139 07/17/2022 0843   K 3.5 07/17/2022 0843   CL 108 07/17/2022 0843   CO2 24 07/17/2022 0843   BUN 20 07/17/2022 0843   CREATININE 0.66 07/17/2022 0843   CREATININE 0.51 (L) 03/20/2022 0914   CREATININE 0.81 03/26/2021 1117   GLUCOSE 120 (H)  07/17/2022 0843   CALCIUM 8.0 (L) 07/17/2022 0843   AST 20 07/17/2022 0843   AST 18 03/20/2022 0914   ALT 12 07/17/2022 0843   ALT 13 03/20/2022 0914   ALKPHOS 22 (L) 07/17/2022 0843   BILITOT 0.3 07/17/2022 0843   BILITOT 0.4 03/20/2022 0914   PROT 6.8 07/17/2022 0843   ALBUMIN 2.5 (L) 07/17/2022 0843     Present during today's visit: patient and his daughter  Assessment and Plan: Continue pomalidomide 3mg , 21 on/7 off this cycle  Kappa/ IgG increasing at last office visit, today's labs are still pending Plan to increase to 4mg  dose with the following cycle   Oral Chemotherapy Side Effect/Intolerance:  No reported side effects, patient tolerating medication well  Oral Chemotherapy Adherence: Patient missed a few doses due to a delivery issue with the last cycle. He has medication in hand for his current cycle.  No patient barriers to medication adherence identified.   New medications: none reported  Medication Access Issues: Patient awaiting medication delivery, per [facility] medication is out for delivery today.   Patient expressed understanding and was in agreement with this plan. He also understands that He can call clinic at any time with any questions, concerns, or complaints.   Follow-up plan: RTC in 4 weeks  Thank you for allowing me to participate  in the care of this very pleasant patient.   Time Total: 15 mins  Visit consisted of counseling and education on dealing with issues of symptom management in the setting of serious and potentially life-threatening illness.Greater than 50%  of this time was spent counseling and coordinating care related to the above assessment and plan.  Signed by: Remi Haggard, PharmD, BCPS, Nolon Bussing, CPP Hematology/Oncology Clinical Pharmacist Practitioner [place]/[facility] Oral Chemotherapy [facility] [phone]  07/17/2022 11:52 AM

## 2022-07-18 LAB — KAPPA/LAMBDA LIGHT CHAINS
Kappa free light chain: 120.8 mg/L — ABNORMAL HIGH (ref 3.3–19.4)
Kappa, lambda light chain ratio: 12.2 — ABNORMAL HIGH (ref 0.26–1.65)
Lambda free light chains: 9.9 mg/L (ref 5.7–26.3)

## 2022-07-18 LAB — IGG, IGA, IGM
IgA: 54 mg/dL — ABNORMAL LOW (ref 61–437)
IgG (Immunoglobin G), Serum: 2579 mg/dL — ABNORMAL HIGH (ref 603–1613)
IgM (Immunoglobulin M), Srm: 22 mg/dL (ref 15–143)

## 2022-07-21 ENCOUNTER — Telehealth: Payer: Self-pay

## 2022-07-21 NOTE — Telephone Encounter (Signed)
Patient daughter called stating patient needs a Letter from MD stating it is okay for him to have two teeth pulled. The dentist office states they can not do anything until they get an clearance from oncology.  Patient daughter is requesting a callback when letter is complete so she can pick it up for their upcoming appointment on 6/24. Callback [phone] [name])

## 2022-07-22 ENCOUNTER — Telehealth: Payer: Self-pay

## 2022-07-22 NOTE — Telephone Encounter (Signed)
Daughter called in requesting MD review MRI results to bilateral hips. Daughter asking if patient will need more radiation to hip. Pls advise.

## 2022-07-23 NOTE — Telephone Encounter (Signed)
Called daughter to let her know letter has been written and is ready for pick up down stairs.

## 2022-07-25 ENCOUNTER — Telehealth: Payer: Self-pay

## 2022-07-25 NOTE — Telephone Encounter (Signed)
Spoke w/ Michele Mcalpine PT with [facility] called to get a VO to extend pt's PT for one more week  Advise [name]-PT that it was 'ok" on behalf of Dr. Tanya Nones    ZOX:WRUE also wanted to make pcp aware that pt had a fall. Pt fell out in the yard on right hip. Pt is complaining of being sore in his right hip, and flank area.   4:29pm Called and spoke w/pt's daughter, Dean [name] regarding pt's fall. Per Daughter, fall happened on We., 07/23/22, per daughter pt stated that he feels ok and is not c/o of any symptoms. Advise daughter that should pt starts to c/o of any symptoms that to take pt to UC/ED due to pcp is on vacation this week. Daughter voiced understanding.

## 2022-07-25 NOTE — Telephone Encounter (Signed)
[name] PT with [facility] called to get a VO to extend pt's PT for one more week. [name] also wanted to make pcp aware that pt had a fall. Pt fell out in the yard on right hip. Pt is complaining of being sore in his right hip, and flank area.   Please advise.  Cb#: [phone]

## 2022-07-28 ENCOUNTER — Other Ambulatory Visit: Payer: Self-pay | Admitting: Oncology

## 2022-07-28 DIAGNOSIS — C9000 Multiple myeloma not having achieved remission: Secondary | ICD-10-CM

## 2022-07-28 DIAGNOSIS — G4700 Insomnia, unspecified: Secondary | ICD-10-CM

## 2022-07-30 NOTE — Progress Notes (Signed)
Histology and Location of Primary Cancer: Multiple myeloma without remission    Location(s) of Symptomatic tumor(s):  MR HIP LEFT W WO CONTRAST 07/09/2022  IMPRESSION: Compared to 08/01/2021:   1. Overall interval progression of numerous destructive bone lesions throughout the visualized pelvis, proximal femurs, and distal lumbar spine consistent with known myelomatous involvement. There is increased, now near fluid bright signal within the majority of the bilateral proximal femoral lesions and an elongated lesion within the right inferior pubic ramus, possible necrosis. 2. There is again complete involvement of the visualized proximal left femoral diaphysis. There is again involvement of the bilateral femoral intertrochanteric regions which increases risk for pathologic fracture, however the bilateral proximal femoral lesions actually appeared to subjectively be stable to decreased in size compared to 08/01/2021. No acute pathologic fracture is visualized. 3. 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 4. Interval increase in right-greater-than-left iliacus muscle myelomatous involvement. 5. Redemonstration of enlarged prostate gland with mild to moderate impression on the base of the urinary bladder. Numerous bladder diverticula are again noted. Findings are again consistent with sequela of partial bladder outlet obstruction.  Past/Anticipated chemotherapy by medical oncology, if any:  Jeralyn Ruths, MD 06/19/2022  INTERVAL HISTORY: Patient returns to clinic today for repeat laboratory work, further evaluation, and continuation of Pomalyst and Zometa.  He continues to have chronic weakness and fatigue and multiple falls.  He continues to have chronic bilateral hip pain.  He denies any fevers.  He has no neurologic complaints.  He denies any fevers.  His appetite has improved and he has gained weight.  He has no chest pain, shortness of  breath, cough, or hemoptysis.  He denies any nausea, vomiting, constipation, or diarrhea.  He has no urinary complaints.  Patient offers no further specific complaints today.   ASSESSMENT: Multiple myeloma   PLAN:   Multiple myeloma: Please see note from September 17, 2020 for historic details of his myeloma and treatment.  Since reinitiating daratumumab, patient's M spike trended down to 3.6, but recently has increased back to 5.3.  He was switched to single agent Pomalyst and his most recent M spike is stable at 1.3.  IgG and kappa free light chains have slowly trended up.  We previously discussed discontinuing treatment and hospice, but patient wishes to pursue Pomalyst.  He expressed understanding that there are likely no other treatments available if Pomalyst were no longer working.  Proceed with dose reduced Pomalyst 3 mg daily for 21 days with 7 days off.  Patient will also receive Zometa today.  Given patient's improved performance status and increasing myeloma numbers, will increase Pomalyst to 4 mg daily for 21 days and 7 days off with the next cycle.  Return to clinic in 4 weeks for further evaluation and continuation of treatment.  Appreciate clinical pharmacy input.   Back/joint pain: Chronic and she imaged.  MRI results reviewed independently consistent with progressive myeloma.  No pathologic fractures reported.  Patient has completed palliative XRT.  Continue current narcotics as prescribed.    Peripheral neuropathy: Chronic and unchanged.  Continue gabapentin as prescribed. Insomnia/anxiety: Chronic and unchanged.  Patient is now having his medications adjusted by primary care.   Anemia: Hemoglobin has trended down slightly to 10.8. Hypocalcemia: Given patient's low albumin this corrects to near normal.  Proceed with Zometa as above. Pulmonary embolism: Patient is now on Eliquis.   Patient also confirms DNR/DNI.  Patient's main complaints related to symptomatic tumor(s) are:  He  continues to have chronic weakness and fatigue and multiple falls.  He continues to have chronic bilateral hip pain.    Pain on a scale of 0-10 is: mainly in right hip, moderate everyday pain,   If Spine Met(s), symptoms, if any, include: Bowel/Bladder retention or incontinence (please describe): pain medication makes him constipated, takes medication for this Numbness or weakness in extremities (please describe): weakness in legs, pt also has neuropathy, feet swelling as well from new cancer medication Current Decadron regimen, if applicable: none  Ambulatory status? Walker? Wheelchair?: walker but has had several falls, typically daughter said when he attempts to get in his truck he has trouble  SAFETY ISSUES: Prior radiation? Yes Radiation Treatment Dates: 1) 09/29/17 - 10/14/17 2) 02/19/17 - 02/22/17 3) 12/01/16 - 12/10/16  4) 04/19/2014 - 05/02/2014  5) 02/21/2014 - 03/08/2014 6) 01/05/2013 - 02/10/2013   Site/dose:   1) Ribs, Left / 30 Gy delivered in 10 fractions of 3 Gy 2) Right temporal Scalp / 30 Gy in 10 fractions 3) 1. Parasternal nodule, chest- 24 Gy total delivered in 8 fractions      2. Left sacro-iliac, pelvis- 24 Gy total delivered in 8 fractions  4) Lumbar spine and 25 gray in 10 fractions 5) Right posterior chest wall area, 30 gray in 10 fractions 6) Right occipital condyle region to 45 Gy in 25 fractions Pacemaker/ICD? none Possible current pregnancy? no Is the patient on methotrexate? no  Additional Complaints / other details:  Daughter had no major questions or concerns. She just wants to know the overall plan.

## 2022-08-04 ENCOUNTER — Other Ambulatory Visit: Payer: Self-pay | Admitting: Pharmacist

## 2022-08-04 DIAGNOSIS — C9000 Multiple myeloma not having achieved remission: Secondary | ICD-10-CM

## 2022-08-04 MED ORDER — POMALIDOMIDE 4 MG PO CAPS
4.0000 mg | ORAL_CAPSULE | Freq: Every day | ORAL | 0 refills | Status: DC
Start: 2022-08-04 — End: 2022-09-29

## 2022-08-11 ENCOUNTER — Ambulatory Visit: Payer: Medicare Other

## 2022-08-11 DIAGNOSIS — C9000 Multiple myeloma not having achieved remission: Secondary | ICD-10-CM | POA: Insufficient documentation

## 2022-08-11 DIAGNOSIS — Z923 Personal history of irradiation: Secondary | ICD-10-CM | POA: Insufficient documentation

## 2022-08-12 ENCOUNTER — Encounter: Payer: Self-pay | Admitting: Radiation Oncology

## 2022-08-12 ENCOUNTER — Telehealth: Payer: Self-pay

## 2022-08-12 NOTE — Telephone Encounter (Signed)
Rn called pt daughter in a attempt to obtain pre consult information. Pt daughter stated she was taking her granddaughter to the dentist and would call back soon. RN awaiting call back from family.

## 2022-08-12 NOTE — Progress Notes (Signed)
Radiation Oncology         [phone]) [phone] ________________________________  Outpatient Re-Consultation  Name: Dean [name]. MRN: [id number]  Date: 08/13/2022  DOB: 01-14-1936  GM:WNUUVOZ, Priscille Heidelberg, MD  Jeralyn Ruths, MD   REFERRING PHYSICIAN: Jeralyn Ruths, MD  DIAGNOSIS: There were no encounter diagnoses.  Multiple myeloma without remission with multiple sites of osseous metastases   HISTORY OF PRESENT ILLNESS::Dean [name]. is a 87 y.o. male who is accompanied by his daughter. He requested to be seen to be considered for additional radiation therapy. I last saw the patient on 01/09/22 for follow up after receiving palliative radiation to his right hip. In summary, he has a long-standing history of multiple myeloma since at least 02/2014 when he was diagnosed by bone marrow biopsy. He has been previously treated with RVD, as well as palliative radiation to various osseous lesions, intermittently. Most recently, he has continued on Pomalyst with Zometa under Dr. Orlie Dakin at [facility].   Since his last visit, his daughter contacted our office on 05/08/22 to request additional radiation to the left hip. 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  On evaluation today the patient reports ***   PREVIOUS  RADIATION THERAPY: Yes   8) 11/18/21 - 11/21/21: Right hip / 20 Gy in 5 fractions  7) 09/17/21: Left hip / 8 Gy in 1 fraction (Dr. Rushie Chestnut)  6) 09/29/17 - 10/14/17: Ribs, Left / 30 Gy in 10 fractions  5) 02/19/17 - 02/22/17: Right temporal Scalp / 30 Gy in 10 fractions  4) 12/01/16 - 12/10/16: 1. Parasternal nodule, chest / 24 Gy in 8 fractions  2. Left sacro-iliac, pelvis / 24 Gy in 8 fractions   3) 04/19/14 - 05/02/14: Lumbar spine / 25 gray in 10 fractions  2) 02/21/14 - 03/08/14: Right posterior chest wall area / 30 gray in 10 fractions  1) 01/05/13 - 02/10/13: Left occipital condyle region / 45 Gy in 25 fractions   PAST MEDICAL HISTORY:  Past Medical History:  Diagnosis Date   BPH (benign prostatic hyperplasia)    Colonic diverticular abscess 01/08/2015   Diverticulitis 12/22/2014   complicated by abscess and required percutaneous drainage   GERD (gastroesophageal reflux disease)    H/O ETOH abuse    History of chemotherapy last done jan 2017   History of radiation therapy 01/05/13-02/10/13   45 gray to left occipital condyle region   History of radiation therapy 12/01/16-12/10/16   Parasternal nodule, chest- 24 Gy total delivered in 8 fractions, Left sacro-iliac, pelvis- 24 Gy total delivered in 8 fractions    History of radiation therapy 02/19/17-02/22/17   right temporal scalp 30 Gy in 10 fractions   History of radiation therapy    Right hip( Pelvis) 11/18/21-11/21/21-Dr. Antony Blackbird   Intra-abdominal abscess [facility])    Multiple myeloma (HCC) 2015   Neuropathy    Radiation 02/21/14-03/08/14   right posterior chest wall area 30 gray   Radiation 04/19/14-05/02/14   lumbar spine  25 gray   Skull lesion    Left occipital condyle   Wrist fracture, left    x 2    PAST SURGICAL HISTORY: Past Surgical History:  Procedure Laterality Date   COLONOSCOPY N/A 04/19/2015   Procedure: COLONOSCOPY;  Surgeon: West Bali, MD;  Location: [facility];  Service: Endoscopy;  Laterality: N/A;   1:30 PM   CYST EXCISION  1959   tail bone   IR IMAGING GUIDED PORT INSERTION  09/25/2017    FAMILY HISTORY:  Family History  Problem Relation Age of Onset   Diabetes Mother    Stroke Mother    Hypertension Father     SOCIAL HISTORY:  Social History   Tobacco Use   Smoking status: Never   Smokeless tobacco: Never  Vaping Use   Vaping Use: Never used  Substance Use Topics   Alcohol use: No    Comment: " Not much no more"   Drug use: No    ALLERGIES:  Allergies  Allergen Reactions   Codeine Other (See Comments)    Headache   Morphine And Codeine Other (See Comments)    Makes him feel weird   Revlimid [Lenalidomide] Other (See Comments)    "Causes me to become weak"    MEDICATIONS:  Current Outpatient Medications  Medication Sig Dispense Refill   acyclovir (ZOVIRAX) 400 MG tablet Take 1 tablet (400 mg total) by mouth 2 (two) times daily. 60 tablet 6   ALPRAZolam (XANAX) 1 MG tablet TAKE ONE TABLET (1 MG TOTAL) BY MOUTH THREE (THREE) TIMES DAILY AS NEEDED. FOR ANXIETY 90 tablet 0   amoxicillin (AMOXIL) 500 MG capsule Take 500 mg by mouth 2 (two) times daily.     apixaban (ELIQUIS) 5 MG TABS tablet TAKE TWO TABLETS BY MOUTH (10MG  TOTAL) TWICE A DAY. THEN TAKE ONE TABLET (5MG ) TWICE A DAY STARTING ON 02/19/22 60 tablet 1   CALCIUM-VITAMIN D PO Take 1 tablet by mouth 2 (two) times daily.      cyanocobalamin (VITAMIN B12) 500 MCG tablet Take 1 tablet (500 mcg total) by mouth daily.     dexamethasone (DECADRON) 4 MG tablet TAKE ONE TABLET (4 MG TOTAL) BY MOUTH DAILY. 30 tablet 0   fluticasone (FLONASE) 50 MCG/ACT nasal spray PLACE 2 SPRAYS INTO BOTH NOSTRILS DAILY 16 g 6   folic acid (FOLVITE) 1 MG tablet Take 1 tablet (1 mg total) by mouth daily. 90 tablet 1   furosemide (LASIX) 20 MG tablet Take 1 tablet (20 mg total) by mouth daily as needed. 90 tablet 1   gabapentin (NEURONTIN) 400 MG capsule Take 1 capsule (400 mg total) by mouth 3 (three) times daily. 90 capsule 4    Homeopathic Products (THERAWORX RELIEF EX) Apply topically as needed.     lidocaine-prilocaine (EMLA) cream Apply small amount over port one (1) hour prior to appointment. 30 g 0   montelukast (SINGULAIR) 10 MG tablet TAKE 1 TABLET BY MOUTH EVERY NIGHT AT BEDTIME 30 tablet 3   Oxycodone HCl 10 MG TABS TAKE ONE TO TWO TABLETS BY MOUTH THREE TIMES DAILY AS NEEDED 90 tablet 0   pomalidomide (POMALYST) 4 MG capsule Take 1 capsule (4 mg total) by mouth daily. Take for 21 days, then hold for 7 days. Repeat every 28 days. 21 capsule 0   tamsulosin (FLOMAX) 0.4 MG CAPS capsule TAKE ONE CAPSULE BY MOUTH EVERY NIGHT ATBEDTIME 30 capsule 3   traZODone (DESYREL) 100 MG tablet TAKE THREE TABLETS BY MOUTH  EVERY NIGHT AT BEDTIME 90 tablet 1   TURMERIC PO Take 1 tablet by mouth 2 (two) times daily.     No current facility-administered medications for this encounter.   Facility-Administered Medications Ordered in Other Encounters  Medication Dose Route Frequency Provider Last Rate Last Admin   heparin lock flush 100 unit/mL  500 Units Intravenous Once Orlie Dakin, Tollie Pizza, MD        REVIEW OF SYSTEMS:  A 10+ POINT REVIEW OF SYSTEMS WAS OBTAINED including neurology, dermatology, psychiatry, cardiac, respiratory, lymph, extremities, GI, GU, musculoskeletal, constitutional, reproductive, HEENT.  Pain complaints as above.   PHYSICAL EXAM:  vitals were not taken for this visit.   General: Alert and oriented, in no acute distress, remains in wheelchair for evaluation HEENT: Head is normocephalic. Extraocular movements are intact.  Neck: Neck is supple, no palpable cervical or supraclavicular lymphadenopathy. Heart: Regular in rate and rhythm with no murmurs, rubs, or gallops. Chest: Clear to auscultation bilaterally, with no rhonchi, wheezes, or rales.  Left distal clavicle swelling consistent with recent fracture. Abdomen: Soft, nontender, nondistended, with no rigidity or guarding. Extremities: No cyanosis or  edema. Lymphatics: see Neck Exam Skin: No concerning lesions. Musculoskeletal: symmetric strength and muscle tone throughout. Neurologic: Cranial nerves II through XII are grossly intact. No obvious focalities. Speech is fluent. Coordination is intact. Psychiatric: Judgment and insight are intact. Affect is appropriate. ***  ECOG = 2  0 - Asymptomatic (Fully active, able to carry on all predisease activities without restriction)  1 - Symptomatic but completely ambulatory (Restricted in physically strenuous activity but ambulatory and able to carry out work of a light or sedentary nature. For example, light housework, office work)  2 - Symptomatic, <50% in bed during the day (Ambulatory and capable of all self care but unable to carry out any work activities. Up and about more than 50% of waking hours)  3 - Symptomatic, >50% in bed, but not bedbound (Capable of only limited self-care, confined to bed or chair 50% or more of waking hours)  4 - Bedbound (Completely disabled. Cannot carry on any self-care. Totally confined to bed or chair)  5 - Death   Santiago Glad MM, Creech RH, Tormey DC, et al. [phone]). "Toxicity and response criteria of the [facility]". Am. Evlyn Clines. Oncol. 5 (6): 649-55  LABORATORY DATA:  Lab Results  Component Value Date   WBC 3.8 (L) 07/17/2022   HGB 10.8 (L) 07/17/2022   HCT 34.1 (L) 07/17/2022   MCV 96.9 07/17/2022   PLT 152 07/17/2022   NEUTROABS 2.0 07/17/2022   Lab Results  Component Value Date   NA 139 07/17/2022   K 3.5 07/17/2022   CL 108 07/17/2022   CO2 24 07/17/2022   GLUCOSE 120 (H) 07/17/2022   BUN 20 07/17/2022   CREATININE 0.66 07/17/2022   CALCIUM 8.0 (L) 07/17/2022      RADIOGRAPHY: No results found.    IMPRESSION: Multiple myeloma without remission with multiple sites of osseous metastases   {He is having consistent pain in the right pelvis area.  Recent MRI and bone scan shows significant disease in this area.   Would recommend a short course of palliative radiation therapy to the right pelvis area.}  I discussed the general course of radiation therapy anticipated side effects and potential long-term toxicities.  He understands radiation well given his multiple treatments and wishes to proceed with radiation treatment as planned.    PLAN: We will proceed with CT simulation later  this morning.  He will receive ***   *** minutes of total time was spent for this patient encounter, including preparation, face-to-face counseling with the patient and coordination of care, physical exam, and documentation of the encounter.   ------------------------------------------------  Billie Lade, PhD, MD   This document serves as a record of services personally performed by Antony Blackbird, MD. It was created on his behalf by Mickie Bail, a trained medical scribe. The creation of this record is based on the scribe's personal observations and the provider's statements to them. This document has been checked and approved by the attending provider.

## 2022-08-12 NOTE — Telephone Encounter (Signed)
Rn called pt daughter for meaningful use and nurse evaluation information. Note completed and routed to Dr. Roselind Messier for review.

## 2022-08-13 ENCOUNTER — Ambulatory Visit
Admission: RE | Admit: 2022-08-13 | Discharge: 2022-08-13 | Disposition: A | Discharge status: Home / Self Care | Payer: Medicare Other | Source: Ambulatory Visit | Attending: Radiation Oncology | Admitting: Radiation Oncology

## 2022-08-13 ENCOUNTER — Other Ambulatory Visit: Payer: Self-pay

## 2022-08-13 VITALS — BP 142/80 | HR 55 | Temp 97.3°F | Resp 18 | Ht 68.0 in | Wt 189.1 lb

## 2022-08-13 DIAGNOSIS — K219 Gastro-esophageal reflux disease without esophagitis: Secondary | ICD-10-CM | POA: Diagnosis not present

## 2022-08-13 DIAGNOSIS — Z7952 Long term (current) use of systemic steroids: Secondary | ICD-10-CM | POA: Insufficient documentation

## 2022-08-13 DIAGNOSIS — Z923 Personal history of irradiation: Secondary | ICD-10-CM | POA: Diagnosis not present

## 2022-08-13 DIAGNOSIS — Z7961 Long term (current) use of immunomodulator: Secondary | ICD-10-CM | POA: Diagnosis not present

## 2022-08-13 DIAGNOSIS — G893 Neoplasm related pain (acute) (chronic): Secondary | ICD-10-CM | POA: Diagnosis not present

## 2022-08-13 DIAGNOSIS — N400 Enlarged prostate without lower urinary tract symptoms: Secondary | ICD-10-CM | POA: Diagnosis not present

## 2022-08-13 DIAGNOSIS — Z7901 Long term (current) use of anticoagulants: Secondary | ICD-10-CM | POA: Insufficient documentation

## 2022-08-13 DIAGNOSIS — Z79899 Other long term (current) drug therapy: Secondary | ICD-10-CM | POA: Diagnosis not present

## 2022-08-13 DIAGNOSIS — C9000 Multiple myeloma not having achieved remission: Secondary | ICD-10-CM

## 2022-08-13 DIAGNOSIS — Z79624 Long term (current) use of inhibitors of nucleotide synthesis: Secondary | ICD-10-CM | POA: Diagnosis not present

## 2022-08-13 DIAGNOSIS — G629 Polyneuropathy, unspecified: Secondary | ICD-10-CM | POA: Insufficient documentation

## 2022-08-13 DIAGNOSIS — C7951 Secondary malignant neoplasm of bone: Secondary | ICD-10-CM | POA: Diagnosis not present

## 2022-08-13 DIAGNOSIS — Z9221 Personal history of antineoplastic chemotherapy: Secondary | ICD-10-CM | POA: Diagnosis not present

## 2022-08-13 DIAGNOSIS — R102 Pelvic and perineal pain: Secondary | ICD-10-CM | POA: Diagnosis not present

## 2022-08-14 ENCOUNTER — Inpatient Hospital Stay (HOSPITAL_BASED_OUTPATIENT_CLINIC_OR_DEPARTMENT_OTHER): Payer: Medicare Other | Admitting: Oncology

## 2022-08-14 ENCOUNTER — Inpatient Hospital Stay: Payer: Medicare Other | Admitting: Pharmacist

## 2022-08-14 ENCOUNTER — Inpatient Hospital Stay: Payer: Medicare Other | Attending: Oncology

## 2022-08-14 ENCOUNTER — Inpatient Hospital Stay: Payer: Medicare Other

## 2022-08-14 ENCOUNTER — Encounter: Payer: Self-pay | Admitting: Oncology

## 2022-08-14 VITALS — BP 122/66 | HR 79 | Temp 97.0°F | Ht 68.0 in | Wt 191.9 lb

## 2022-08-14 DIAGNOSIS — Z923 Personal history of irradiation: Secondary | ICD-10-CM | POA: Diagnosis not present

## 2022-08-14 DIAGNOSIS — C9000 Multiple myeloma not having achieved remission: Secondary | ICD-10-CM

## 2022-08-14 DIAGNOSIS — C7951 Secondary malignant neoplasm of bone: Secondary | ICD-10-CM | POA: Diagnosis not present

## 2022-08-14 LAB — COMPREHENSIVE METABOLIC PANEL
ALT: 14 U/L (ref 0–44)
AST: 19 U/L (ref 15–41)
Albumin: 2.5 g/dL — ABNORMAL LOW (ref 3.5–5.0)
Alkaline Phosphatase: 26 U/L — ABNORMAL LOW (ref 38–126)
Anion gap: 7 (ref 5–15)
BUN: 26 mg/dL — ABNORMAL HIGH (ref 8–23)
CO2: 23 mmol/L (ref 22–32)
Calcium: 7.9 mg/dL — ABNORMAL LOW (ref 8.9–10.3)
Chloride: 105 mmol/L (ref 98–111)
Creatinine, Ser: 0.6 mg/dL — ABNORMAL LOW (ref 0.61–1.24)
GFR, Estimated: >60 mL/min (ref >60)
Glucose, Bld: 128 mg/dL — ABNORMAL HIGH (ref 70–99)
Potassium: 3.3 mmol/L — ABNORMAL LOW (ref 3.5–5.1)
Sodium: 135 mmol/L (ref 135–145)
Total Bilirubin: 0.4 mg/dL (ref 0.3–1.2)
Total Protein: 7.1 g/dL (ref 6.5–8.1)

## 2022-08-14 LAB — CBC WITH DIFFERENTIAL/PLATELET
Abs Immature Granulocytes: 0.04 10*3/uL (ref 0.00–0.07)
Basophils Absolute: 0.1 10*3/uL (ref 0.0–0.1)
Basophils Relative: 1 %
Eosinophils Absolute: 0 10*3/uL (ref 0.0–0.5)
Eosinophils Relative: 0 %
HCT: 35.6 % — ABNORMAL LOW (ref 39.0–52.0)
Hemoglobin: 11.1 g/dL — ABNORMAL LOW (ref 13.0–17.0)
Immature Granulocytes: 1 %
Lymphocytes Relative: 13 %
Lymphs Abs: 0.5 10*3/uL — ABNORMAL LOW (ref 0.7–4.0)
MCH: 30.6 pg (ref 26.0–34.0)
MCHC: 31.2 g/dL (ref 30.0–36.0)
MCV: 98.1 fL (ref 80.0–100.0)
Monocytes Absolute: 0.8 10*3/uL (ref 0.1–1.0)
Monocytes Relative: 19 %
Neutro Abs: 2.7 10*3/uL (ref 1.7–7.7)
Neutrophils Relative %: 66 %
Platelets: 166 10*3/uL (ref 150–400)
RBC: 3.63 MIL/uL — ABNORMAL LOW (ref 4.22–5.81)
RDW: 17.1 % — ABNORMAL HIGH (ref 11.5–15.5)
WBC: 4.1 10*3/uL (ref 4.0–10.5)
nRBC: 0 % (ref 0.0–0.2)

## 2022-08-14 MED ORDER — ZOLEDRONIC ACID 4 MG/100ML IV SOLN
4.0000 mg | Freq: Once | INTRAVENOUS | Status: AC
  Administered 2022-08-14: 4 mg via INTRAVENOUS
  Filled 2022-08-14: qty 100

## 2022-08-14 MED ORDER — SODIUM CHLORIDE 0.9 % IV SOLN
Freq: Once | INTRAVENOUS | Status: AC
  Filled 2022-08-14: qty 250

## 2022-08-14 NOTE — Progress Notes (Signed)
Pt asking if he can have something stronger for pain?

## 2022-08-14 NOTE — Progress Notes (Signed)
Oral Chemotherapy Clinic [facility]  [phone] [phone]  Patient Care Team: Donita Brooks, MD as PCP - General (Family Medicine) Antony Blackbird, MD as Consulting Physician (Radiation Oncology) West Bali, MD (Inactive) as Consulting Physician (Gastroenterology) Karie Soda, MD as Consulting Physician (General Surgery) Jeralyn Ruths, MD as Consulting Physician (Oncology)   Name of the patient: Dean [name]  [id number]  July 08, 1935   Date of visit: 08/14/22  HPI: Patient is a 87 y.o. male with multiple myeloma, currently treated with pomalidomide. Started in December 2023.  Reason for Consult: Oral chemotherapy follow-up for pomalidomide therapy.   PAST MEDICAL HISTORY: Past Medical History:  Diagnosis Date   BPH (benign prostatic hyperplasia)    Colonic diverticular abscess 01/08/2015   Diverticulitis 12/22/2014   complicated by abscess and required percutaneous drainage   GERD (gastroesophageal reflux disease)    H/O ETOH abuse    History of chemotherapy last done jan 2017   History of radiation therapy 01/05/13-02/10/13   45 gray to left occipital condyle region   History of radiation therapy 12/01/16-12/10/16   Parasternal nodule, chest- 24 Gy total delivered in 8 fractions, Left sacro-iliac, pelvis- 24 Gy total delivered in 8 fractions    History of radiation therapy 02/19/17-02/22/17   right temporal scalp 30 Gy in 10 fractions   History of radiation therapy    Right hip( Pelvis) 11/18/21-11/21/21-Dr. Antony Blackbird   Intra-abdominal abscess [facility])    Multiple myeloma (HCC) 2015   Neuropathy    Radiation 02/21/14-03/08/14   right posterior chest wall area 30 gray   Radiation 04/19/14-05/02/14   lumbar spine 25 gray   Skull lesion    Left occipital condyle   Wrist fracture, left    x 2    HEMATOLOGY/ONCOLOGY HISTORY:  Oncology History  Multiple myeloma without remission (HCC)  11/16/2012 Initial Diagnosis   L  occipital condyle destructive lesion, not biopsied secondary to location. XRT   12/01/2012 Imaging   L3 superior endplate compression FX, no lytic lesions to suggest myeloma   02/06/2014 Imaging   soft tissue mass along the right posterior fifth rib with some rib destruction   02/16/2014 Miscellaneous   Normal CBC, Normal CMP, kappa,lamda ratio of 2.62 (abnormal), UIEP with slightly restricted band, IgG kappa, SPEP/IEP with monoclonal protein at 0.79 g/dl, normal igG, suppressed IGM   02/20/2014 Bone Marrow Biopsy   33% kappa restricted plasma cells Normal FISH, normal cytogenetics   02/21/2014 - 03/08/2014 Radiation Therapy   30Gy to R rib lesion, lesion not biopsied   03/16/2014 PET scan   Scattered hypermetabolic osseous lesions, including the calvarium, ribs, left scapula, sacrum, and left femoral shaft. An expansile left vertebral body lesion at L3 extends into the epidural space of the left lateral recess,    03/17/2014 - 04/07/2014 Chemotherapy   Velcade and Dexamethasone due to initial denial of Revlimid by insurance company.  Zometa monthly.   03/22/2014 Imaging   MR_ L-Spine- Enhancing lesions compatible with multiple myeloma at L2, L3, and S1-2.   04/07/2014 - 07/28/2014 Chemotherapy   RVD with Revlimid at 25 mg days 1-14.  Revlimid dose reduced to 25 mg every other day x 14 days with 7 day respite beginning on day 1 of cycle 2.   04/17/2014 Adverse Reaction   Revlimid-induced rash.  Treated with steroids.   07/28/2014 - 08/04/2014 Chemotherapy   Revlimid daily   08/04/2014 Adverse Reaction   Velcade-induced peripheral neuropathy   08/04/2014 Treatment Plan Change  D/C Velcade   08/11/2014 PET scan   Response to therapy. Improvement and resolution of foci of osseous hypermetabolism.   08/22/2014 - 08/28/2014 Chemotherapy   Revlimid 25 mg days 1-21 every 28 days   08/28/2014 Adverse Reaction   Revlimid-induced rash. Medication held.  Medrol dose Pak prescribed.   09/04/2014 - 01/08/2015  Chemotherapy   Revlimid 10 mg every other day, without dexamethasone   01/08/2015 - 01/15/2015 Hospital Admission   Colonic diverticular abscess with IR drain placement   03/02/2015 PET scan   Only bony uptake is low activity at site of deformity/callus at R second rib FX, high activity in sigmoid colon, advanced sigmoid divertidulosis. Abscess not resolved?   06/04/2015 Imaging   CT nonobstructive L renal calculus, diverticulosis of descending and sigmoid colon without inflammation, moderate prostatic enlargement   07/12/2015 Surgery   Diverticulitis s/p robotic sigmoid colectomy with Dr. Michaell Cowing   08/23/2015 PET scan   Primarily similar hypermetabolic osseous foci within the R sided ribs. new L third rib focus of hypermetab and sclerosis is favored to be related to healing FX. no soft tissue myeloma id'd. Presumed postop hypermetab and edema about the R pelvic wall    03/06/2016 PET scan   1. Reduced activity in the prior lesion such as the right second and fifth rib lesions, activity nearly completely resolved and significantly less than added mediastinal blood pool activity. No new lesions are identified. 2. Other imaging findings of potential clinical significance: Coronary, aortic arch, and branch vessel atherosclerotic vascular disease. Aortoiliac atherosclerotic vascular disease. Enlarged prostate gland. Colonic diverticula.   02/17/2017 PET scan   HEAD/NECK: No hypermetabolic activity in the scalp. No hypermetabolic cervical lymph nodes. CHEST: No hypermetabolic mediastinal or hilar nodes. Right upper lobe scarring/atelectasis. No suspicious pulmonary nodules on the CT scan. ABDOMEN/PELVIS: No abnormal hypermetabolic activity within the liver, pancreas, adrenal glands, or spleen. No hypermetabolic lymph nodes in the abdomen or pelvis. Atherosclerotic calcifications of the abdominal aorta and branch vessels. Left renal sinus cysts. Sigmoid diverticulosis, without evidence of  diverticulitis. Prostatomegaly. SKELETON: Vague hypermetabolism involving the inferior sternum, max SUV 3.2, previously 8.2. Focal hypermetabolism involving the left sacrum, max SUV 4.0, previously 6.7. EXTREMITIES: No abnormal hypermetabolic activity in the lower extremities.   02/18/2017 -  Chemotherapy   Bortezomib 1.3mg /m2 QWk + Dexamethasone 10mg  QTue/Fri --Cycle #1, 02/18/17    02/18/2017 - 08/26/2017 Chemotherapy   The patient had bortezomib SQ (VELCADE) chemo injection 3 mg, 1.3 mg/m2 = 3 mg, Subcutaneous,  Once, 7 of 9 cycles Administration: 3 mg (02/18/2017), 3 mg (02/26/2017), 3 mg (03/04/2017), 3 mg (03/11/2017), 3 mg (04/08/2017), 3 mg (03/18/2017), 3 mg (03/25/2017), 3 mg (04/01/2017), 3 mg (05/06/2017), 3 mg (05/20/2017), 3 mg (06/03/2017), 3 mg (06/17/2017), 3 mg (07/01/2017), 3 mg (07/15/2017), 3 mg (07/29/2017), 3 mg (08/12/2017), 3 mg (08/26/2017)  for chemotherapy treatment.    09/17/2017 - 10/02/2021 Chemotherapy   Patient is on Treatment Plan : MYELOMA Daratumumab q28d     09/17/2017 - 12/04/2021 Chemotherapy   Patient is on Treatment Plan : MYELOMA Daratumumab SQ q28d       ALLERGIES:  is allergic to codeine, morphine and codeine, and revlimid [lenalidomide].  MEDICATIONS:  Current Outpatient Medications  Medication Sig Dispense Refill   acyclovir (ZOVIRAX) 400 MG tablet Take 1 tablet (400 mg total) by mouth 2 (two) times daily. 60 tablet 6   ALPRAZolam (XANAX) 1 MG tablet TAKE ONE TABLET (1 MG TOTAL) BY MOUTH THREE (THREE) TIMES DAILY AS NEEDED. FOR  ANXIETY 90 tablet 0   apixaban (ELIQUIS) 5 MG TABS tablet TAKE TWO TABLETS BY MOUTH (10MG  TOTAL) TWICE A DAY. THEN TAKE ONE TABLET (5MG ) TWICE A DAY STARTING ON 02/19/22 60 tablet 1   CALCIUM-VITAMIN D PO Take 1 tablet by mouth 2 (two) times daily.      cyanocobalamin (VITAMIN B12) 500 MCG tablet Take 1 tablet (500 mcg total) by mouth daily.     dexamethasone (DECADRON) 4 MG tablet TAKE ONE TABLET (4 MG TOTAL) BY MOUTH DAILY. 30 tablet 0    fluticasone (FLONASE) 50 MCG/ACT nasal spray PLACE 2 SPRAYS INTO BOTH NOSTRILS DAILY 16 g 6   folic acid (FOLVITE) 1 MG tablet Take 1 tablet (1 mg total) by mouth daily. 90 tablet 1   furosemide (LASIX) 20 MG tablet Take 1 tablet (20 mg total) by mouth daily as needed. 90 tablet 1   gabapentin (NEURONTIN) 400 MG capsule Take 1 capsule (400 mg total) by mouth 3 (three) times daily. 90 capsule 4   Homeopathic Products (THERAWORX RELIEF EX) Apply topically as needed.     lidocaine-prilocaine (EMLA) cream Apply small amount over port one (1) hour prior to appointment. 30 g 0   montelukast (SINGULAIR) 10 MG tablet TAKE 1 TABLET BY MOUTH EVERY NIGHT AT BEDTIME 30 tablet 3   Oxycodone HCl 10 MG TABS TAKE ONE TO TWO TABLETS BY MOUTH THREE TIMES DAILY AS NEEDED 90 tablet 0   pomalidomide (POMALYST) 4 MG capsule Take 1 capsule (4 mg total) by mouth daily. Take for 21 days, then hold for 7 days. Repeat every 28 days. 21 capsule 0   tamsulosin (FLOMAX) 0.4 MG CAPS capsule TAKE ONE CAPSULE BY MOUTH EVERY NIGHT ATBEDTIME 30 capsule 3   traZODone (DESYREL) 100 MG tablet TAKE THREE TABLETS BY MOUTH EVERY NIGHT AT BEDTIME 90 tablet 1   TURMERIC PO Take 1 tablet by mouth 2 (two) times daily.     No current facility-administered medications for this visit.   Facility-Administered Medications Ordered in Other Visits  Medication Dose Route Frequency Provider Last Rate Last Admin   heparin lock flush 100 unit/mL  500 Units Intravenous Once Jeralyn Ruths, MD        VITAL SIGNS: There were no vitals taken for this visit. There were no vitals filed for this visit.  Estimated body mass index is 29.18 kg/m as calculated from the following:   Height as of an earlier encounter on 08/14/22: 5\' 8"  (1.727 m).   Weight as of an earlier encounter on 08/14/22: 87 kg (191 lb 14.4 oz).  LABS: CBC:    Component Value Date/Time   WBC 4.1 08/14/2022 0905   HGB 11.1 (L) 08/14/2022 0905   HCT 35.6 (L) 08/14/2022 0905    PLT 166 08/14/2022 0905   MCV 98.1 08/14/2022 0905   NEUTROABS 2.7 08/14/2022 0905   LYMPHSABS 0.5 (L) 08/14/2022 0905   MONOABS 0.8 08/14/2022 0905   EOSABS 0.0 08/14/2022 0905   BASOSABS 0.1 08/14/2022 0905   Comprehensive Metabolic Panel:    Component Value Date/Time   NA 135 08/14/2022 0905   K 3.3 (L) 08/14/2022 0905   CL 105 08/14/2022 0905   CO2 23 08/14/2022 0905   BUN 26 (H) 08/14/2022 0905   CREATININE 0.60 (L) 08/14/2022 0905   CREATININE 0.51 (L) 03/20/2022 0914   CREATININE 0.81 03/26/2021 1117   GLUCOSE 128 (H) 08/14/2022 0905   CALCIUM 7.9 (L) 08/14/2022 0905   AST 19 08/14/2022 0905  AST 18 03/20/2022 0914   ALT 14 08/14/2022 0905   ALT 13 03/20/2022 0914   ALKPHOS 26 (L) 08/14/2022 0905   BILITOT 0.4 08/14/2022 0905   BILITOT 0.4 03/20/2022 0914   PROT 7.1 08/14/2022 0905   ALBUMIN 2.5 (L) 08/14/2022 0905     Present during today's visit: patient and his daughter  Assessment and Plan: Continue pomalidomide as scheduled, patient increasing dose to 4mg , 21 on/7 off this cycle  Kappa/ IgG down at last office visit, today's labs are still pending, will continue to monitor Of note, patient reported several falls recently, concern ed that patient is slightly dehydrated, suggested that he increase his water intake. His daughter state that she has told him the same thing   Oral Chemotherapy Side Effect/Intolerance:  No reported side effects, patient tolerating medication well  Oral Chemotherapy Adherence: No missed doses reported No patient barriers to medication adherence identified.   New medications: none reported  Medication Access Issues: No issues, patient fills medication at [facility]  Patient expressed understanding and was in agreement with this plan. He also understands that He can call clinic at any time with any questions, concerns, or complaints.   Follow-up plan: RTC in 4 weeks  Thank you for allowing me to participate in the care  of this very pleasant patient.   Time Total: 15 mins  Visit consisted of counseling and education on dealing with issues of symptom management in the setting of serious and potentially life-threatening illness.Greater than 50%  of this time was spent counseling and coordinating care related to the above assessment and plan.  Signed by: Remi Haggard, PharmD, BCPS, Nolon Bussing, CPP Hematology/Oncology Clinical Pharmacist Practitioner [place]/[facility] Oral Chemotherapy [facility] [phone]  08/14/2022 10:21 AM

## 2022-08-14 NOTE — Progress Notes (Signed)
[facility]  [phone] [phone]  ID: Dean [name]. OB: [birth year]  MR#: [id number]  AOZ#:[id number]  Patient Care Team: Donita Brooks, MD as PCP - General (Family Medicine) Antony Blackbird, MD as Consulting Physician (Radiation Oncology) West Bali, MD (Inactive) as Consulting Physician (Gastroenterology) Karie Soda, MD as Consulting Physician (General Surgery) Jeralyn Ruths, MD as Consulting Physician (Oncology)  CHIEF COMPLAINT: Multiple myeloma  INTERVAL HISTORY: Patient returns to clinic today for repeat laboratory work, further evaluation, and continuation of Pomalyst and Zometa.  He is currently getting XRT to his right pelvis/hip.  He had several falls this past month which she attributes to dehydration.  He continues to have improved strength.  He continues to have chronic pain. He denies any fevers.  He has no neurologic complaints.  He denies any fevers.  His appetite has improved and he has gained weight.  He has no chest pain, shortness of breath, cough, or hemoptysis.  He denies any nausea, vomiting, constipation, or diarrhea.  He has no urinary complaints.  Patient offers no further specific complaints today.  REVIEW OF SYSTEMS:   Review of Systems  Constitutional:  Positive for malaise/fatigue. Negative for fever and weight loss.  Respiratory: Negative.  Negative for cough, hemoptysis and shortness of breath.   Cardiovascular: Negative.  Negative for chest pain and leg swelling.  Gastrointestinal: Negative.  Negative for abdominal pain and nausea.  Genitourinary: Negative.  Negative for dysuria and flank pain.  Musculoskeletal:  Positive for back pain, falls and joint pain.  Skin: Negative.  Negative for rash.  Neurological:  Positive for sensory change and weakness. Negative for dizziness, focal weakness and headaches.  Psychiatric/Behavioral: Negative.  The patient is not nervous/anxious and does not have  insomnia.     As per HPI. Otherwise, a complete review of systems is negative.  PAST MEDICAL HISTORY: Past Medical History:  Diagnosis Date   BPH (benign prostatic hyperplasia)    Colonic diverticular abscess 01/08/2015   Diverticulitis 12/22/2014   complicated by abscess and required percutaneous drainage   GERD (gastroesophageal reflux disease)    H/O ETOH abuse    History of chemotherapy last done jan 2017   History of radiation therapy 01/05/13-02/10/13   45 gray to left occipital condyle region   History of radiation therapy 12/01/16-12/10/16   Parasternal nodule, chest- 24 Gy total delivered in 8 fractions, Left sacro-iliac, pelvis- 24 Gy total delivered in 8 fractions    History of radiation therapy 02/19/17-02/22/17   right temporal scalp 30 Gy in 10 fractions   History of radiation therapy    Right hip( Pelvis) 11/18/21-11/21/21-Dr. Antony Blackbird   Intra-abdominal abscess (HCC)    Multiple myeloma (HCC) 2015   Neuropathy    Radiation 02/21/14-03/08/14   right posterior chest wall area 30 gray   Radiation 04/19/14-05/02/14   lumbar spine 25 gray   Skull lesion    Left occipital condyle   Wrist fracture, left    x 2    PAST SURGICAL HISTORY: Past Surgical History:  Procedure Laterality Date   COLONOSCOPY N/A 04/19/2015   Procedure: COLONOSCOPY;  Surgeon: West Bali, MD;  Location: [facility];  Service: Endoscopy;  Laterality: N/A;  1:30 PM   CYST EXCISION  1959   tail bone   IR IMAGING GUIDED PORT INSERTION  09/25/2017    FAMILY HISTORY: Family History  Problem Relation Age of Onset   Diabetes Mother    Stroke Mother  Hypertension Father     ADVANCED DIRECTIVES (Y/N):  N  HEALTH MAINTENANCE: Social History   Tobacco Use   Smoking status: Never   Smokeless tobacco: Never  Vaping Use   Vaping status: Never Used  Substance Use Topics   Alcohol use: No    Comment: " Not much no more"   Drug use: No     Colonoscopy:  PAP:  Bone density:  Lipid  panel:  Allergies  Allergen Reactions   Codeine Other (See Comments)    Headache   Morphine And Codeine Other (See Comments)    Makes him feel weird   Revlimid [Lenalidomide] Other (See Comments)    "Causes me to become weak"    Current Outpatient Medications  Medication Sig Dispense Refill   acyclovir (ZOVIRAX) 400 MG tablet Take 1 tablet (400 mg total) by mouth 2 (two) times daily. 60 tablet 6   ALPRAZolam (XANAX) 1 MG tablet TAKE ONE TABLET (1 MG TOTAL) BY MOUTH THREE (THREE) TIMES DAILY AS NEEDED. FOR ANXIETY 90 tablet 0   apixaban (ELIQUIS) 5 MG TABS tablet TAKE TWO TABLETS BY MOUTH (10MG  TOTAL) TWICE A DAY. THEN TAKE ONE TABLET (5MG ) TWICE A DAY STARTING ON 02/19/22 60 tablet 1   CALCIUM-VITAMIN D PO Take 1 tablet by mouth 2 (two) times daily.      cyanocobalamin (VITAMIN B12) 500 MCG tablet Take 1 tablet (500 mcg total) by mouth daily.     dexamethasone (DECADRON) 4 MG tablet TAKE ONE TABLET (4 MG TOTAL) BY MOUTH DAILY. 30 tablet 0   fluticasone (FLONASE) 50 MCG/ACT nasal spray PLACE 2 SPRAYS INTO BOTH NOSTRILS DAILY 16 g 6   folic acid (FOLVITE) 1 MG tablet Take 1 tablet (1 mg total) by mouth daily. 90 tablet 1   furosemide (LASIX) 20 MG tablet Take 1 tablet (20 mg total) by mouth daily as needed. 90 tablet 1   gabapentin (NEURONTIN) 400 MG capsule Take 1 capsule (400 mg total) by mouth 3 (three) times daily. 90 capsule 4   Homeopathic Products (THERAWORX RELIEF EX) Apply topically as needed.     lidocaine-prilocaine (EMLA) cream Apply small amount over port one (1) hour prior to appointment. 30 g 0   montelukast (SINGULAIR) 10 MG tablet TAKE 1 TABLET BY MOUTH EVERY NIGHT AT BEDTIME 30 tablet 3   Oxycodone HCl 10 MG TABS TAKE ONE TO TWO TABLETS BY MOUTH THREE TIMES DAILY AS NEEDED 90 tablet 0   pomalidomide (POMALYST) 4 MG capsule Take 1 capsule (4 mg total) by mouth daily. Take for 21 days, then hold for 7 days. Repeat every 28 days. 21 capsule 0   tamsulosin (FLOMAX) 0.4 MG  CAPS capsule TAKE ONE CAPSULE BY MOUTH EVERY NIGHT ATBEDTIME 30 capsule 3   traZODone (DESYREL) 100 MG tablet TAKE THREE TABLETS BY MOUTH EVERY NIGHT AT BEDTIME 90 tablet 1   TURMERIC PO Take 1 tablet by mouth 2 (two) times daily.     No current facility-administered medications for this visit.   Facility-Administered Medications Ordered in Other Visits  Medication Dose Route Frequency Provider Last Rate Last Admin   heparin lock flush 100 unit/mL  500 Units Intravenous Once Jeralyn Ruths, MD        OBJECTIVE: Vitals:   08/14/22 0904  BP: 122/66  Pulse: 79  Temp: (!) 97 F (36.1 C)  SpO2: 100%      Body mass index is 29.18 kg/m.    ECOG FS:1 - Symptomatic but completely ambulatory  General: Well-developed, well-nourished, no acute distress.  Sitting in a wheelchair. Eyes: Pink conjunctiva, anicteric sclera. HEENT: Normocephalic, moist mucous membranes. Lungs: No audible wheezing or coughing. Heart: Regular rate and rhythm. Abdomen: Soft, nontender, no obvious distention. Musculoskeletal: No edema, cyanosis, or clubbing. Neuro: Alert, answering all questions appropriately. Cranial nerves grossly intact. Skin: No rashes or petechiae noted. Psych: Normal affect.  LAB RESULTS:  Lab Results  Component Value Date   NA 135 08/14/2022   K 3.3 (L) 08/14/2022   CL 105 08/14/2022   CO2 23 08/14/2022   GLUCOSE 128 (H) 08/14/2022   BUN 26 (H) 08/14/2022   CREATININE 0.60 (L) 08/14/2022   CALCIUM 7.9 (L) 08/14/2022   PROT 7.1 08/14/2022   ALBUMIN 2.5 (L) 08/14/2022   AST 19 08/14/2022   ALT 14 08/14/2022   ALKPHOS 26 (L) 08/14/2022   BILITOT 0.4 08/14/2022   GFRNONAA >60 08/14/2022   GFRAA >60 10/13/2019    Lab Results  Component Value Date   WBC 4.1 08/14/2022   NEUTROABS 2.7 08/14/2022   HGB 11.1 (L) 08/14/2022   HCT 35.6 (L) 08/14/2022   MCV 98.1 08/14/2022   PLT 166 08/14/2022     STUDIES: No results found.  ASSESSMENT: Multiple  myeloma  PLAN:  Multiple myeloma: Please see note from September 17, 2020 for historic details of his myeloma and treatment.  Since reinitiating daratumumab, patient's M spike trended down to 3.6, but recently has increased back to 5.3.  He was switched to single agent Pomalyst and his most recent M spike is stable at 1.3.  IgG and kappa free light chains are now trending down. We previously discussed discontinuing treatment and hospice, but patient wishes to pursue Pomalyst.  He expressed understanding that there are likely no other treatments available if Pomalyst were no longer working.  Given patient's improved performance status, will increase Pomalyst to 4 mg daily for 21 days with 7 days off.  Patient will also receive Zometa today.  Return to clinic in 4 weeks for further evaluation and continuation of treatment.  Appreciate clinical pharmacy input.   Back/joint pain: Chronic and unchanged.  Patient is receiving XRT to his right hip/pelvis.  MRI results reviewed independently consistent with progressive myeloma.  No pathologic fractures reported.  Continue current narcotics as prescribed.    Peripheral neuropathy: Chronic and unchanged.  Continue gabapentin as prescribed. Insomnia/anxiety: Chronic and unchanged.  Patient is now having his medications adjusted by primary care.   Anemia: Chronic and unchanged.  Patient's hemoglobin is 11.1. Hypocalcemia: Given patient's low albumin this corrects to near normal.  Proceed with Zometa as above. Pulmonary embolism: Patient is now on Eliquis.  Patient also confirms DNR/DNI.   Patient expressed understanding and was in agreement with this plan. He also understands that He can call clinic at any time with any questions, concerns, or complaints.    Jeralyn Ruths, MD   08/14/2022 7:10 PM

## 2022-08-15 LAB — IGG, IGA, IGM
IgA: 46 mg/dL — ABNORMAL LOW (ref 61–437)
IgG (Immunoglobin G), Serum: 2832 mg/dL — ABNORMAL HIGH (ref 603–1613)
IgM (Immunoglobulin M), Srm: 17 mg/dL (ref 15–143)

## 2022-08-15 LAB — KAPPA/LAMBDA LIGHT CHAINS
Kappa free light chain: 153.9 mg/L — ABNORMAL HIGH (ref 3.3–19.4)
Kappa, lambda light chain ratio: 16.2 — ABNORMAL HIGH (ref 0.26–1.65)
Lambda free light chains: 9.5 mg/L (ref 5.7–26.3)

## 2022-08-18 ENCOUNTER — Other Ambulatory Visit: Payer: Self-pay

## 2022-08-18 ENCOUNTER — Ambulatory Visit
Admission: RE | Admit: 2022-08-18 | Discharge: 2022-08-18 | Disposition: A | Discharge status: Home / Self Care | Payer: Medicare Other | Source: Ambulatory Visit | Attending: Radiation Oncology | Admitting: Radiation Oncology

## 2022-08-18 DIAGNOSIS — R310 Gross hematuria: Secondary | ICD-10-CM | POA: Diagnosis not present

## 2022-08-18 DIAGNOSIS — Z4682 Encounter for fitting and adjustment of non-vascular catheter: Secondary | ICD-10-CM | POA: Diagnosis not present

## 2022-08-18 DIAGNOSIS — I4891 Unspecified atrial fibrillation: Secondary | ICD-10-CM | POA: Diagnosis not present

## 2022-08-18 DIAGNOSIS — Z23 Encounter for immunization: Secondary | ICD-10-CM | POA: Diagnosis not present

## 2022-08-18 DIAGNOSIS — J189 Pneumonia, unspecified organism: Secondary | ICD-10-CM | POA: Diagnosis not present

## 2022-08-18 DIAGNOSIS — N401 Enlarged prostate with lower urinary tract symptoms: Secondary | ICD-10-CM | POA: Diagnosis not present

## 2022-08-18 DIAGNOSIS — J90 Pleural effusion, not elsewhere classified: Secondary | ICD-10-CM | POA: Diagnosis not present

## 2022-08-18 DIAGNOSIS — R0902 Hypoxemia: Secondary | ICD-10-CM | POA: Diagnosis not present

## 2022-08-18 DIAGNOSIS — Z510 Encounter for antineoplastic radiation therapy: Secondary | ICD-10-CM | POA: Diagnosis not present

## 2022-08-18 DIAGNOSIS — Z833 Family history of diabetes mellitus: Secondary | ICD-10-CM | POA: Diagnosis not present

## 2022-08-18 DIAGNOSIS — R059 Cough, unspecified: Secondary | ICD-10-CM | POA: Diagnosis not present

## 2022-08-18 DIAGNOSIS — J9601 Acute respiratory failure with hypoxia: Secondary | ICD-10-CM | POA: Diagnosis not present

## 2022-08-18 DIAGNOSIS — Y731 Therapeutic (nonsurgical) and rehabilitative gastroenterology and urology devices associated with adverse incidents: Secondary | ICD-10-CM | POA: Diagnosis present

## 2022-08-18 DIAGNOSIS — Z79899 Other long term (current) drug therapy: Secondary | ICD-10-CM | POA: Diagnosis not present

## 2022-08-18 DIAGNOSIS — C7951 Secondary malignant neoplasm of bone: Secondary | ICD-10-CM | POA: Diagnosis not present

## 2022-08-18 DIAGNOSIS — T8383XA Hemorrhage of genitourinary prosthetic devices, implants and grafts, initial encounter: Secondary | ICD-10-CM | POA: Diagnosis not present

## 2022-08-18 DIAGNOSIS — R338 Other retention of urine: Secondary | ICD-10-CM | POA: Diagnosis not present

## 2022-08-18 DIAGNOSIS — R609 Edema, unspecified: Secondary | ICD-10-CM | POA: Diagnosis not present

## 2022-08-18 DIAGNOSIS — N3289 Other specified disorders of bladder: Secondary | ICD-10-CM | POA: Diagnosis not present

## 2022-08-18 DIAGNOSIS — Z7901 Long term (current) use of anticoagulants: Secondary | ICD-10-CM | POA: Diagnosis not present

## 2022-08-18 DIAGNOSIS — R319 Hematuria, unspecified: Secondary | ICD-10-CM | POA: Diagnosis not present

## 2022-08-18 DIAGNOSIS — F419 Anxiety disorder, unspecified: Secondary | ICD-10-CM | POA: Diagnosis present

## 2022-08-18 DIAGNOSIS — E876 Hypokalemia: Secondary | ICD-10-CM | POA: Diagnosis not present

## 2022-08-18 DIAGNOSIS — Z86711 Personal history of pulmonary embolism: Secondary | ICD-10-CM | POA: Diagnosis not present

## 2022-08-18 DIAGNOSIS — N200 Calculus of kidney: Secondary | ICD-10-CM | POA: Diagnosis not present

## 2022-08-18 DIAGNOSIS — I959 Hypotension, unspecified: Secondary | ICD-10-CM | POA: Diagnosis not present

## 2022-08-18 DIAGNOSIS — Z923 Personal history of irradiation: Secondary | ICD-10-CM | POA: Diagnosis not present

## 2022-08-18 DIAGNOSIS — Z1152 Encounter for screening for COVID-19: Secondary | ICD-10-CM | POA: Diagnosis not present

## 2022-08-18 DIAGNOSIS — I2699 Other pulmonary embolism without acute cor pulmonale: Secondary | ICD-10-CM | POA: Diagnosis not present

## 2022-08-18 DIAGNOSIS — E875 Hyperkalemia: Secondary | ICD-10-CM | POA: Diagnosis not present

## 2022-08-18 DIAGNOSIS — Z66 Do not resuscitate: Secondary | ICD-10-CM | POA: Diagnosis not present

## 2022-08-18 DIAGNOSIS — N138 Other obstructive and reflux uropathy: Secondary | ICD-10-CM | POA: Diagnosis not present

## 2022-08-18 DIAGNOSIS — J9811 Atelectasis: Secondary | ICD-10-CM | POA: Diagnosis not present

## 2022-08-18 DIAGNOSIS — C801 Malignant (primary) neoplasm, unspecified: Secondary | ICD-10-CM | POA: Diagnosis not present

## 2022-08-18 DIAGNOSIS — D72819 Decreased white blood cell count, unspecified: Secondary | ICD-10-CM | POA: Diagnosis not present

## 2022-08-18 DIAGNOSIS — Z8249 Family history of ischemic heart disease and other diseases of the circulatory system: Secondary | ICD-10-CM | POA: Diagnosis not present

## 2022-08-18 DIAGNOSIS — C9000 Multiple myeloma not having achieved remission: Secondary | ICD-10-CM | POA: Diagnosis not present

## 2022-08-18 DIAGNOSIS — N368 Other specified disorders of urethra: Secondary | ICD-10-CM | POA: Diagnosis not present

## 2022-08-18 DIAGNOSIS — Z452 Encounter for adjustment and management of vascular access device: Secondary | ICD-10-CM | POA: Diagnosis not present

## 2022-08-18 DIAGNOSIS — R0602 Shortness of breath: Secondary | ICD-10-CM | POA: Diagnosis not present

## 2022-08-18 DIAGNOSIS — D630 Anemia in neoplastic disease: Secondary | ICD-10-CM | POA: Diagnosis present

## 2022-08-18 DIAGNOSIS — R531 Weakness: Secondary | ICD-10-CM | POA: Diagnosis not present

## 2022-08-18 DIAGNOSIS — R918 Other nonspecific abnormal finding of lung field: Secondary | ICD-10-CM | POA: Diagnosis not present

## 2022-08-18 LAB — PROTEIN ELECTROPHORESIS, SERUM
A/G Ratio: 0.7 (ref 0.7–1.7)
Albumin ELP: 2.7 g/dL — ABNORMAL LOW (ref 2.9–4.4)
Alpha-1-Globulin: 0.3 g/dL (ref 0.0–0.4)
Alpha-2-Globulin: 0.7 g/dL (ref 0.4–1.0)
Beta Globulin: 0.8 g/dL (ref 0.7–1.3)
Gamma Globulin: 2.2 g/dL — ABNORMAL HIGH (ref 0.4–1.8)
Globulin, Total: 4 g/dL — ABNORMAL HIGH (ref 2.2–3.9)
M-Spike, %: 2 g/dL — ABNORMAL HIGH
Total Protein ELP: 6.7 g/dL (ref 6.0–8.5)

## 2022-08-18 LAB — RAD ONC ARIA SESSION SUMMARY
Course Elapsed Days: 0
Plan Fractions Treated to Date: 1
Plan Prescribed Dose Per Fraction: 4 Gy
Plan Total Fractions Prescribed: 5
Plan Total Prescribed Dose: 20 Gy
Reference Point Dosage Given to Date: 4 Gy
Reference Point Session Dosage Given: 4 Gy
Session Number: 1

## 2022-08-19 ENCOUNTER — Ambulatory Visit: Payer: Medicare Other

## 2022-08-19 ENCOUNTER — Other Ambulatory Visit: Payer: Self-pay

## 2022-08-19 ENCOUNTER — Telehealth: Payer: Self-pay

## 2022-08-19 ENCOUNTER — Encounter (HOSPITAL_COMMUNITY): Payer: Self-pay | Admitting: *Deleted

## 2022-08-19 ENCOUNTER — Emergency Department (HOSPITAL_COMMUNITY): Payer: Medicare Other

## 2022-08-19 ENCOUNTER — Inpatient Hospital Stay (HOSPITAL_COMMUNITY)
Admission: EM | Admit: 2022-08-19 | Discharge: 2022-08-24 | DRG: 981 | Disposition: A | Discharge status: Home Health | Payer: Medicare Other | Attending: Family Medicine | Admitting: Family Medicine

## 2022-08-19 DIAGNOSIS — J9601 Acute respiratory failure with hypoxia: Secondary | ICD-10-CM | POA: Diagnosis present

## 2022-08-19 DIAGNOSIS — R918 Other nonspecific abnormal finding of lung field: Secondary | ICD-10-CM | POA: Diagnosis not present

## 2022-08-19 DIAGNOSIS — R059 Cough, unspecified: Secondary | ICD-10-CM | POA: Diagnosis not present

## 2022-08-19 DIAGNOSIS — R0602 Shortness of breath: Secondary | ICD-10-CM | POA: Diagnosis not present

## 2022-08-19 DIAGNOSIS — I4891 Unspecified atrial fibrillation: Secondary | ICD-10-CM | POA: Diagnosis not present

## 2022-08-19 DIAGNOSIS — N401 Enlarged prostate with lower urinary tract symptoms: Secondary | ICD-10-CM | POA: Diagnosis present

## 2022-08-19 DIAGNOSIS — J189 Pneumonia, unspecified organism: Secondary | ICD-10-CM | POA: Diagnosis present

## 2022-08-19 DIAGNOSIS — R531 Weakness: Secondary | ICD-10-CM | POA: Diagnosis present

## 2022-08-19 DIAGNOSIS — T8383XA Hemorrhage of genitourinary prosthetic devices, implants and grafts, initial encounter: Secondary | ICD-10-CM | POA: Diagnosis present

## 2022-08-19 DIAGNOSIS — Z885 Allergy status to narcotic agent status: Secondary | ICD-10-CM

## 2022-08-19 DIAGNOSIS — N200 Calculus of kidney: Secondary | ICD-10-CM | POA: Diagnosis not present

## 2022-08-19 DIAGNOSIS — E876 Hypokalemia: Secondary | ICD-10-CM | POA: Diagnosis not present

## 2022-08-19 DIAGNOSIS — J90 Pleural effusion, not elsewhere classified: Secondary | ICD-10-CM | POA: Diagnosis not present

## 2022-08-19 DIAGNOSIS — D72819 Decreased white blood cell count, unspecified: Secondary | ICD-10-CM | POA: Diagnosis present

## 2022-08-19 DIAGNOSIS — N368 Other specified disorders of urethra: Secondary | ICD-10-CM | POA: Diagnosis not present

## 2022-08-19 DIAGNOSIS — Z1152 Encounter for screening for COVID-19: Secondary | ICD-10-CM

## 2022-08-19 DIAGNOSIS — Z79899 Other long term (current) drug therapy: Secondary | ICD-10-CM | POA: Diagnosis not present

## 2022-08-19 DIAGNOSIS — D630 Anemia in neoplastic disease: Secondary | ICD-10-CM | POA: Diagnosis present

## 2022-08-19 DIAGNOSIS — Z452 Encounter for adjustment and management of vascular access device: Secondary | ICD-10-CM | POA: Diagnosis not present

## 2022-08-19 DIAGNOSIS — N400 Enlarged prostate without lower urinary tract symptoms: Secondary | ICD-10-CM | POA: Diagnosis present

## 2022-08-19 DIAGNOSIS — N3289 Other specified disorders of bladder: Secondary | ICD-10-CM | POA: Diagnosis not present

## 2022-08-19 DIAGNOSIS — Z23 Encounter for immunization: Secondary | ICD-10-CM | POA: Diagnosis present

## 2022-08-19 DIAGNOSIS — Z823 Family history of stroke: Secondary | ICD-10-CM

## 2022-08-19 DIAGNOSIS — C7951 Secondary malignant neoplasm of bone: Secondary | ICD-10-CM | POA: Diagnosis present

## 2022-08-19 DIAGNOSIS — Z888 Allergy status to other drugs, medicaments and biological substances status: Secondary | ICD-10-CM

## 2022-08-19 DIAGNOSIS — Z923 Personal history of irradiation: Secondary | ICD-10-CM

## 2022-08-19 DIAGNOSIS — Z66 Do not resuscitate: Secondary | ICD-10-CM | POA: Diagnosis present

## 2022-08-19 DIAGNOSIS — R310 Gross hematuria: Secondary | ICD-10-CM | POA: Diagnosis not present

## 2022-08-19 DIAGNOSIS — Z7901 Long term (current) use of anticoagulants: Secondary | ICD-10-CM

## 2022-08-19 DIAGNOSIS — Z86711 Personal history of pulmonary embolism: Secondary | ICD-10-CM | POA: Diagnosis not present

## 2022-08-19 DIAGNOSIS — Y731 Therapeutic (nonsurgical) and rehabilitative gastroenterology and urology devices associated with adverse incidents: Secondary | ICD-10-CM | POA: Diagnosis not present

## 2022-08-19 DIAGNOSIS — J9811 Atelectasis: Secondary | ICD-10-CM | POA: Diagnosis not present

## 2022-08-19 DIAGNOSIS — I2699 Other pulmonary embolism without acute cor pulmonale: Secondary | ICD-10-CM | POA: Diagnosis not present

## 2022-08-19 DIAGNOSIS — R338 Other retention of urine: Secondary | ICD-10-CM | POA: Diagnosis not present

## 2022-08-19 DIAGNOSIS — Z9221 Personal history of antineoplastic chemotherapy: Secondary | ICD-10-CM

## 2022-08-19 DIAGNOSIS — N138 Other obstructive and reflux uropathy: Secondary | ICD-10-CM | POA: Diagnosis present

## 2022-08-19 DIAGNOSIS — E875 Hyperkalemia: Secondary | ICD-10-CM | POA: Diagnosis not present

## 2022-08-19 DIAGNOSIS — Z4682 Encounter for fitting and adjustment of non-vascular catheter: Secondary | ICD-10-CM | POA: Diagnosis not present

## 2022-08-19 DIAGNOSIS — I959 Hypotension, unspecified: Secondary | ICD-10-CM | POA: Diagnosis present

## 2022-08-19 DIAGNOSIS — Z833 Family history of diabetes mellitus: Secondary | ICD-10-CM

## 2022-08-19 DIAGNOSIS — C801 Malignant (primary) neoplasm, unspecified: Secondary | ICD-10-CM | POA: Diagnosis not present

## 2022-08-19 DIAGNOSIS — Z8249 Family history of ischemic heart disease and other diseases of the circulatory system: Secondary | ICD-10-CM | POA: Diagnosis not present

## 2022-08-19 DIAGNOSIS — G629 Polyneuropathy, unspecified: Secondary | ICD-10-CM | POA: Diagnosis present

## 2022-08-19 DIAGNOSIS — R319 Hematuria, unspecified: Secondary | ICD-10-CM | POA: Diagnosis not present

## 2022-08-19 DIAGNOSIS — C9000 Multiple myeloma not having achieved remission: Secondary | ICD-10-CM | POA: Diagnosis present

## 2022-08-19 DIAGNOSIS — F419 Anxiety disorder, unspecified: Secondary | ICD-10-CM | POA: Diagnosis present

## 2022-08-19 DIAGNOSIS — R609 Edema, unspecified: Secondary | ICD-10-CM | POA: Diagnosis not present

## 2022-08-19 DIAGNOSIS — R0902 Hypoxemia: Secondary | ICD-10-CM | POA: Diagnosis not present

## 2022-08-19 LAB — CBC
HCT: 28.2 % — ABNORMAL LOW (ref 39.0–52.0)
HCT: 27.9 % — ABNORMAL LOW (ref 39.0–52.0)
Hemoglobin: 8.9 g/dL — ABNORMAL LOW (ref 13.0–17.0)
Hemoglobin: 8.3 g/dL — ABNORMAL LOW (ref 13.0–17.0)
MCH: 31 pg (ref 26.0–34.0)
MCH: 30.2 pg (ref 26.0–34.0)
MCHC: 31.6 g/dL (ref 30.0–36.0)
MCHC: 29.7 g/dL — ABNORMAL LOW (ref 30.0–36.0)
MCV: 98.3 fL (ref 80.0–100.0)
MCV: 101.5 fL — ABNORMAL HIGH (ref 80.0–100.0)
Platelets: 94 10*3/uL — ABNORMAL LOW (ref 150–400)
Platelets: 97 10*3/uL — ABNORMAL LOW (ref 150–400)
RBC: 2.87 MIL/uL — ABNORMAL LOW (ref 4.22–5.81)
RBC: 2.75 MIL/uL — ABNORMAL LOW (ref 4.22–5.81)
RDW: 17.2 % — ABNORMAL HIGH (ref 11.5–15.5)
RDW: 17.2 % — ABNORMAL HIGH (ref 11.5–15.5)
WBC: 1.9 10*3/uL — ABNORMAL LOW (ref 4.0–10.5)
WBC: 4.6 10*3/uL (ref 4.0–10.5)
nRBC: 0 % (ref 0.0–0.2)
nRBC: 0 % (ref 0.0–0.2)

## 2022-08-19 LAB — URINALYSIS, W/ REFLEX TO CULTURE (INFECTION SUSPECTED)
RBC / HPF: >50 RBC/hpf (ref 0–5)
Squamous Epithelial / HPF: NONE SEEN /HPF (ref 0–5)
WBC, UA: NONE SEEN WBC/hpf (ref 0–5)

## 2022-08-19 LAB — RESP PANEL BY RT-PCR (RSV, FLU A&B, COVID)  RVPGX2
Influenza A by PCR: NEGATIVE
Influenza B by PCR: NEGATIVE
Resp Syncytial Virus by PCR: NEGATIVE
SARS Coronavirus 2 by RT PCR: NEGATIVE

## 2022-08-19 LAB — COMPREHENSIVE METABOLIC PANEL
ALT: 17 U/L (ref 0–44)
AST: 18 U/L (ref 15–41)
Albumin: 1.8 g/dL — ABNORMAL LOW (ref 3.5–5.0)
Alkaline Phosphatase: 29 U/L — ABNORMAL LOW (ref 38–126)
Anion gap: 4 — ABNORMAL LOW (ref 5–15)
BUN: 31 mg/dL — ABNORMAL HIGH (ref 8–23)
CO2: 25 mmol/L (ref 22–32)
Calcium: 7.8 mg/dL — ABNORMAL LOW (ref 8.9–10.3)
Chloride: 107 mmol/L (ref 98–111)
Creatinine, Ser: 0.83 mg/dL (ref 0.61–1.24)
GFR, Estimated: >60 mL/min (ref >60)
Glucose, Bld: 110 mg/dL — ABNORMAL HIGH (ref 70–99)
Potassium: 3.7 mmol/L (ref 3.5–5.1)
Sodium: 136 mmol/L (ref 135–145)
Total Bilirubin: 0.5 mg/dL (ref 0.3–1.2)
Total Protein: 6.2 g/dL — ABNORMAL LOW (ref 6.5–8.1)

## 2022-08-19 LAB — LACTIC ACID, PLASMA
Lactic Acid, Venous: 1.1 mmol/L (ref 0.5–1.9)
Lactic Acid, Venous: 1.2 mmol/L (ref 0.5–1.9)

## 2022-08-19 LAB — CBG MONITORING, ED: Glucose-Capillary: 103 mg/dL — ABNORMAL HIGH (ref 70–99)

## 2022-08-19 LAB — APTT: aPTT: 31 seconds (ref 24–36)

## 2022-08-19 LAB — PROTIME-INR
INR: 1.5 — ABNORMAL HIGH (ref 0.8–1.2)
Prothrombin Time: 18.3 seconds — ABNORMAL HIGH (ref 11.4–15.2)

## 2022-08-19 MED ORDER — NALOXONE HCL 2 MG/2ML IJ SOSY
1.0000 mg | PREFILLED_SYRINGE | Freq: Once | INTRAMUSCULAR | Status: AC
  Administered 2022-08-19: 1 mg via INTRAVENOUS
  Filled 2022-08-19: qty 2

## 2022-08-19 MED ORDER — TAMSULOSIN HCL 0.4 MG PO CAPS
0.4000 mg | ORAL_CAPSULE | Freq: Every day | ORAL | Status: DC
  Administered 2022-08-20 – 2022-08-23 (×4): 0.4 mg via ORAL
  Filled 2022-08-19 (×4): qty 1

## 2022-08-19 MED ORDER — SODIUM CHLORIDE 0.9 % IV SOLN
2.0000 g | INTRAVENOUS | Status: AC
  Administered 2022-08-20 – 2022-08-23 (×5): 2 g via INTRAVENOUS
  Filled 2022-08-19 (×5): qty 20

## 2022-08-19 MED ORDER — GUAIFENESIN-DM 100-10 MG/5ML PO SYRP: 15.0000 mL | ORAL_SOLUTION | Freq: Three times a day (TID) | ORAL | Status: DC | PRN

## 2022-08-19 MED ORDER — ONDANSETRON HCL 4 MG/2ML IJ SOLN: 4.0000 mg | Freq: Four times a day (QID) | INTRAMUSCULAR | Status: DC | PRN

## 2022-08-19 MED ORDER — PNEUMOCOCCAL 20-VAL CONJ VACC 0.5 ML IM SUSY
0.5000 mL | PREFILLED_SYRINGE | INTRAMUSCULAR | Status: AC
  Administered 2022-08-22: 0.5 mL via INTRAMUSCULAR
  Filled 2022-08-19 (×2): qty 0.5

## 2022-08-19 MED ORDER — OXYCODONE HCL 5 MG PO TABS
10.0000 mg | ORAL_TABLET | Freq: Three times a day (TID) | ORAL | Status: DC | PRN
  Administered 2022-08-22 – 2022-08-24 (×5): 10 mg via ORAL
  Filled 2022-08-19 (×5): qty 2

## 2022-08-19 MED ORDER — SODIUM CHLORIDE 0.9 % IV BOLUS (SEPSIS)
1000.0000 mL | Freq: Once | INTRAVENOUS | Status: AC
  Administered 2022-08-19: 1000 mL via INTRAVENOUS

## 2022-08-19 MED ORDER — VANCOMYCIN HCL 1750 MG/350ML IV SOLN
1750.0000 mg | Freq: Once | INTRAVENOUS | Status: AC
  Administered 2022-08-19: 1750 mg via INTRAVENOUS
  Filled 2022-08-19: qty 350

## 2022-08-19 MED ORDER — SODIUM CHLORIDE 0.9 % IV SOLN
500.0000 mg | Freq: Once | INTRAVENOUS | Status: AC
  Administered 2022-08-19: 500 mg via INTRAVENOUS
  Filled 2022-08-19: qty 5

## 2022-08-19 MED ORDER — SODIUM CHLORIDE 0.9 % IV BOLUS: 500.0000 mL | Freq: Once | INTRAVENOUS | Status: DC

## 2022-08-19 MED ORDER — POTASSIUM CHLORIDE IN NACL 20-0.9 MEQ/L-% IV SOLN
INTRAVENOUS | Status: AC
  Filled 2022-08-19: qty 1000

## 2022-08-19 MED ORDER — SODIUM CHLORIDE 0.9 % IV SOLN
500.0000 mg | INTRAVENOUS | Status: DC
  Administered 2022-08-21: 500 mg via INTRAVENOUS
  Filled 2022-08-19 (×2): qty 5

## 2022-08-19 MED ORDER — LACTATED RINGERS IV SOLN: INTRAVENOUS | Status: DC

## 2022-08-19 MED ORDER — IOHEXOL 350 MG/ML SOLN
75.0000 mL | Freq: Once | INTRAVENOUS | Status: AC | PRN
  Administered 2022-08-19: 75 mL via INTRAVENOUS

## 2022-08-19 MED ORDER — VANCOMYCIN HCL 1500 MG/300ML IV SOLN: 1500.0000 mg | INTRAVENOUS | Status: DC

## 2022-08-19 MED ORDER — FENTANYL CITRATE PF 50 MCG/ML IJ SOSY
25.0000 ug | PREFILLED_SYRINGE | Freq: Once | INTRAMUSCULAR | Status: AC
  Administered 2022-08-19: 25 ug via INTRAVENOUS
  Filled 2022-08-19: qty 1

## 2022-08-19 MED ORDER — SODIUM CHLORIDE 0.9 % IV BOLUS
1000.0000 mL | Freq: Once | INTRAVENOUS | Status: AC
  Administered 2022-08-19: 1000 mL via INTRAVENOUS

## 2022-08-19 MED ORDER — NALOXONE HCL 2 MG/2ML IJ SOSY: 1.0000 mg | PREFILLED_SYRINGE | Freq: Once | INTRAMUSCULAR | Status: DC

## 2022-08-19 MED ORDER — POLYETHYLENE GLYCOL 3350 17 G PO PACK: 17.0000 g | PACK | Freq: Every day | ORAL | Status: DC | PRN

## 2022-08-19 MED ORDER — ACETAMINOPHEN 650 MG RE SUPP: 650.0000 mg | Freq: Four times a day (QID) | RECTAL | Status: DC | PRN

## 2022-08-19 MED ORDER — SODIUM CHLORIDE 0.9 % IV SOLN
2.0000 g | Freq: Once | INTRAVENOUS | Status: AC
  Administered 2022-08-19: 2 g via INTRAVENOUS
  Filled 2022-08-19: qty 12.5

## 2022-08-19 MED ORDER — ONDANSETRON HCL 4 MG PO TABS: 4.0000 mg | ORAL_TABLET | Freq: Four times a day (QID) | ORAL | Status: DC | PRN

## 2022-08-19 MED ORDER — ACETAMINOPHEN 325 MG PO TABS
650.0000 mg | ORAL_TABLET | Freq: Four times a day (QID) | ORAL | Status: DC | PRN
  Administered 2022-08-22 – 2022-08-24 (×3): 650 mg via ORAL
  Filled 2022-08-19 (×4): qty 2

## 2022-08-19 NOTE — ED Notes (Signed)
 Lab at bedside to draw blood cultures

## 2022-08-19 NOTE — Assessment & Plan Note (Signed)
Follows with Dr. Orlie Dakin.  Currently on chemotherapy with Pomalyst, dose of which was recently increased from 2 to 4 mg.  He is also receiving radiation treatment to his right hip/pelvis.

## 2022-08-19 NOTE — ED Provider Notes (Cosign Needed Addendum)
[place] EMERGENCY DEPARTMENT AT [facility] Provider Note   CSN: [id number] Arrival date & time: 08/19/22  1040     History  Chief Complaint  Patient presents with   Weakness    Reid Regas. is a 87 y.o. male with a PMHx of BPH, multiple myeloma, who presents to the ED with concerns for weakness onset today. Family notes increased weakness at home. Pt being treated with radiation therapy for his multiple myeloma. Doesn't wear oxygen at baseline. Placed on 2L via [place] due to hypoxia to 85%. Family reports patient with concerns for dizziness and lightheadedness yesterday, patient declines at this time. No meds tried PTA. Denies chest pain, shortness of breath, abdominal pain, nausea, vomiting, diarrhea, constipation, headache.   The history is provided by the patient and a relative. No language interpreter was used.       Home Medications Prior to Admission medications   Medication Sig Start Date End Date Taking? Authorizing Provider  acyclovir (ZOVIRAX) 400 MG tablet Take 1 tablet (400 mg total) by mouth 2 (two) times daily. 04/21/22   Jeralyn Ruths, MD  ALPRAZolam (XANAX) 1 MG tablet TAKE ONE TABLET (1 MG TOTAL) BY MOUTH THREE (THREE) TIMES DAILY AS NEEDED. FOR ANXIETY 07/28/22   Borders, Daryl Eastern, NP  apixaban (ELIQUIS) 5 MG TABS tablet TAKE TWO TABLETS BY MOUTH (10MG  TOTAL) TWICE A DAY. THEN TAKE ONE TABLET (5MG ) TWICE A DAY STARTING ON 02/19/22 04/21/22   Jeralyn Ruths, MD  CALCIUM-VITAMIN D PO Take 1 tablet by mouth 2 (two) times daily.     [provider]  cyanocobalamin (VITAMIN B12) 500 MCG tablet Take 1 tablet (500 mcg total) by mouth daily. 02/13/22   Catarina Hartshorn, MD  dexamethasone (DECADRON) 4 MG tablet TAKE ONE TABLET (4 MG TOTAL) BY MOUTH DAILY. 07/28/22   Borders, Daryl Eastern, NP  fluticasone (FLONASE) 50 MCG/ACT nasal spray PLACE 2 SPRAYS INTO BOTH NOSTRILS DAILY 03/03/22   Donita Brooks, MD  folic acid (FOLVITE) 1 MG tablet Take 1 tablet  (1 mg total) by mouth daily. 05/19/22   Jeralyn Ruths, MD  furosemide (LASIX) 20 MG tablet Take 1 tablet (20 mg total) by mouth daily as needed. 04/22/22   Jeralyn Ruths, MD  gabapentin (NEURONTIN) 400 MG capsule Take 1 capsule (400 mg total) by mouth 3 (three) times daily. 04/21/22   Jeralyn Ruths, MD  Homeopathic Products [facility] RELIEF EX) Apply topically as needed.    [provider]  lidocaine-prilocaine (EMLA) cream Apply small amount over port one (1) hour prior to appointment. 09/17/19   Doreatha Massed, MD  montelukast (SINGULAIR) 10 MG tablet TAKE 1 TABLET BY MOUTH EVERY NIGHT AT BEDTIME 03/03/22   Donita Brooks, MD  Oxycodone HCl 10 MG TABS TAKE ONE TO TWO TABLETS BY MOUTH THREE TIMES DAILY AS NEEDED 07/28/22   Borders, Daryl Eastern, NP  pomalidomide (POMALYST) 4 MG capsule Take 1 capsule (4 mg total) by mouth daily. Take for 21 days, then hold for 7 days. Repeat every 28 days. 08/04/22   Jeralyn Ruths, MD  tamsulosin (FLOMAX) 0.4 MG CAPS capsule TAKE ONE CAPSULE BY MOUTH EVERY NIGHT ATBEDTIME 12/09/21   Donita Brooks, MD  traZODone (DESYREL) 100 MG tablet TAKE THREE TABLETS BY MOUTH EVERY NIGHT AT BEDTIME 07/28/22   Borders, Daryl Eastern, NP  TURMERIC PO Take 1 tablet by mouth 2 (two) times daily.    [provider]  Allergies    Codeine, Morphine and codeine, and Revlimid [lenalidomide]    Review of Systems   Review of Systems  Neurological:  Positive for weakness.  All other systems reviewed and are negative.   Physical Exam Updated Vital Signs BP (!) 142/98   Pulse 78   Temp 98.4 F (36.9 C) (Oral)   Resp 16   Ht 5\' 8"  (1.727 m)   Wt 87 kg   SpO2 98%   BMI 29.16 kg/m  Physical Exam Vitals and nursing note reviewed.  Constitutional:      General: He is not in acute distress.    Appearance: He is not diaphoretic.  HENT:     Head: Normocephalic and atraumatic.     Mouth/Throat:     Pharynx: No oropharyngeal exudate.   Eyes:     General: No scleral icterus.    Conjunctiva/sclera: Conjunctivae normal.  Cardiovascular:     Rate and Rhythm: Normal rate and regular rhythm.     Pulses: Normal pulses.     Heart sounds: Normal heart sounds.  Pulmonary:     Effort: Pulmonary effort is normal. No respiratory distress.     Breath sounds: Normal breath sounds. No wheezing.  Abdominal:     General: Bowel sounds are normal.     Palpations: Abdomen is soft. There is no mass.     Tenderness: There is no abdominal tenderness. There is no guarding or rebound.  Musculoskeletal:        General: Normal range of motion.     Cervical back: Normal range of motion and neck supple.  Skin:    General: Skin is warm and dry.  Neurological:     Mental Status: He is alert.  Psychiatric:        Behavior: Behavior normal.     ED Results / Procedures / Treatments   Labs (all labs ordered are listed, but only abnormal results are displayed) Labs Reviewed  CBC - Abnormal; Notable for the following components:      Result Value   WBC 1.9 (*)    RBC 2.87 (*)    Hemoglobin 8.9 (*)    HCT 28.2 (*)    RDW 17.2 (*)    Platelets 94 (*)    All other components within normal limits  COMPREHENSIVE METABOLIC PANEL - Abnormal; Notable for the following components:   Glucose, Bld 110 (*)    BUN 31 (*)    Calcium 7.8 (*)    Total Protein 6.2 (*)    Albumin 1.8 (*)    Alkaline Phosphatase 29 (*)    Anion gap 4 (*)    All other components within normal limits  PROTIME-INR - Abnormal; Notable for the following components:   Prothrombin Time 18.3 (*)    INR 1.5 (*)    All other components within normal limits  URINALYSIS, W/ REFLEX TO CULTURE (INFECTION SUSPECTED) - Abnormal; Notable for the following components:   Color, Urine RED (*)    APPearance TURBID (*)    Glucose, UA   (*)    Value: TEST NOT REPORTED DUE TO COLOR INTERFERENCE OF URINE PIGMENT   Hgb urine dipstick   (*)    Value: TEST NOT REPORTED DUE TO COLOR  INTERFERENCE OF URINE PIGMENT   Bilirubin Urine   (*)    Value: TEST NOT REPORTED DUE TO COLOR INTERFERENCE OF URINE PIGMENT   Ketones, ur   (*)    Value: TEST NOT REPORTED DUE TO COLOR INTERFERENCE  OF URINE PIGMENT   Protein, ur   (*)    Value: TEST NOT REPORTED DUE TO COLOR INTERFERENCE OF URINE PIGMENT   Nitrite   (*)    Value: TEST NOT REPORTED DUE TO COLOR INTERFERENCE OF URINE PIGMENT   Leukocytes,Ua   (*)    Value: TEST NOT REPORTED DUE TO COLOR INTERFERENCE OF URINE PIGMENT   Bacteria, UA RARE (*)    All other components within normal limits  CBG MONITORING, ED - Abnormal; Notable for the following components:   Glucose-Capillary 103 (*)    All other components within normal limits  RESP PANEL BY RT-PCR (RSV, FLU A&B, COVID)  RVPGX2  CULTURE, BLOOD (ROUTINE X 2)  CULTURE, BLOOD (ROUTINE X 2)  LACTIC ACID, PLASMA  LACTIC ACID, PLASMA  APTT    EKG None  Radiology CT Angio Chest PE W and/or Wo Contrast  Result Date: 08/19/2022 CLINICAL DATA:  Weakness hypoxia EXAM: CT ANGIOGRAPHY CHEST WITH CONTRAST TECHNIQUE: Multidetector CT imaging of the chest was performed using the standard protocol during bolus administration of intravenous contrast. Multiplanar CT image reconstructions and MIPs were obtained to evaluate the vascular anatomy. RADIATION DOSE REDUCTION: This exam was performed according to the departmental dose-optimization program which includes automated exposure control, adjustment of the mA and/or kV according to patient size and/or use of iterative reconstruction technique. CONTRAST:  75mL OMNIPAQUE IOHEXOL 350 MG/ML SOLN COMPARISON:  Chest x-ray 08/19/2022, CT chest 02/10/2022 FINDINGS: Cardiovascular: Satisfactory opacification of the pulmonary arteries to the segmental level. No evidence of pulmonary embolism. Nondiagnostic for assessment of bilateral lower lobe distal segmental and subsegmental PE due to motion and inadequate opacification. Cardiomegaly. Moderate  aortic atherosclerosis. No aneurysm. Coronary vascular calcification. Mitral calcification. No pericardial effusion Mediastinum/Nodes: Midline trachea. 1.4 cm left thyroid nodule, no imaging follow-up is recommended. Subcentimeter right paratracheal nodes. Esophagus within normal limits Lungs/Pleura: Small bilateral pleural effusions. Similar lower lobe parenchymal calcifications. Partial lower lobe consolidations. Bandlike scarring in the right upper lobe with mild bronchiectasis. Upper Abdomen: No acute finding Musculoskeletal: Extensive skeletal metastatic disease with numerous lytic lesions involving the sternum, ribs, spine, and shoulders, appears slightly progressive as compared with January CT. Multiple old appearing bilateral rib fractures and old left clavicle fracture. Review of the MIP images confirms the above findings. IMPRESSION: 1. Negative for acute pulmonary embolus. Nondiagnostic for assessment of bilateral lower lobe distal segmental and subsegmental PE due to motion and inadequate opacification. 2. Small bilateral pleural effusions with partial lower lobe consolidations, atelectasis versus pneumonia. Bandlike scarring and mild bronchiectasis in the right upper lobe. 3. Extensive skeletal lytic disease corresponding to history of myeloma, appears slightly progressive as compared with January CT. 4. Aortic atherosclerosis. Aortic Atherosclerosis (ICD10-I70.0). Electronically Signed   By: Jasmine Pang M.D.   On: 08/19/2022 16:41   DG Chest Port 1 View  Result Date: 08/19/2022 CLINICAL DATA:  Shortness of breath, hypoxia. EXAM: PORTABLE CHEST 1 VIEW COMPARISON:  February 10, 2022. FINDINGS: Stable cardiomediastinal silhouette. Right internal jugular Port-A-Cath is unchanged in position. There are again noted multiple bilateral rib lytic lesions consistent with metastatic disease. Minimal bibasilar subsegmental atelectasis is again noted. IMPRESSION: Multiple bilateral rib lytic lesions are again  noted consistent with metastatic disease. Bibasilar subsegmental atelectasis is noted. Electronically Signed   By: Lupita Raider M.D.   On: 08/19/2022 12:14    Procedures .Critical Care E&M  Performed by: Karenann Cai, PA-C Critical care provider statement:    Critical care time (minutes):  45  Critical care time was exclusive of:  Separately billable procedures and treating other patients   Critical care was necessary to treat or prevent imminent or life-threatening deterioration of the following conditions:  Cardiac failure, circulatory failure, respiratory failure and sepsis   Critical care was time spent personally by me on the following activities:  Blood draw for specimens, development of treatment plan with patient or surrogate, evaluation of patient's response to treatment, examination of patient, obtaining history from patient or surrogate, review of old charts, re-evaluation of patient's condition, pulse oximetry, ordering and review of radiographic studies, ordering and review of laboratory studies and ordering and performing treatments and interventions   I assumed direction of critical care for this patient from another provider in my specialty: no     Care discussed with: admitting provider   After initial E/M assessment, critical care services were subsequently performed that were exclusive of separately billable procedures or treatment.       Medications Ordered in ED Medications  vancomycin (VANCOREADY) IVPB 1750 mg/350 mL (0 mg Intravenous Stopped 08/19/22 1516)    Followed by  vancomycin (VANCOREADY) IVPB 1500 mg/300 mL (has no administration in time range)  pneumococcal 20-valent conjugate vaccine (PREVNAR 20) injection 0.5 mL (has no administration in time range)  azithromycin (ZITHROMAX) 500 mg in sodium chloride 0.9 % 250 mL IVPB (has no administration in time range)  sodium chloride 0.9 % bolus 1,000 mL (0 mLs Intravenous Stopped 08/19/22 1254)  naloxone (NARCAN)  injection 1 mg (1 mg Intravenous Given 08/19/22 1120)  ceFEPIme (MAXIPIME) 2 g in sodium chloride 0.9 % 100 mL IVPB (0 g Intravenous Stopped 08/19/22 1254)  sodium chloride 0.9 % bolus 1,000 mL (0 mLs Intravenous Stopped 08/19/22 1501)    And  sodium chloride 0.9 % bolus 1,000 mL (0 mLs Intravenous Stopped 08/19/22 1500)  iohexol (OMNIPAQUE) 350 MG/ML injection 75 mL (75 mLs Intravenous Contrast Given 08/19/22 1552)    ED Course/ Medical Decision Making/ A&P Clinical Course as of 08/19/22 1719  Tue Aug 19, 2022  1134 Pt re-evaluated and asleep on stretcher. Blood pressure improved to 136 systolic.  [SB]  8416 Pt re-evaluated and discussed with patient and daughter at bedside regarding plans for admission. Pt and daughter agreeable at this time.  [SB]  1533 Consult with hospitalist, Dr. Mariea Clonts who is requesting CTA Chest at this time. Will order.  [SB]    Clinical Course User Index [SB] Oluwaseun Cremer A, PA-C                             Medical Decision Making Amount and/or Complexity of Data Reviewed Labs: ordered. Radiology: ordered.  Risk Prescription drug management. Decision regarding hospitalization.   Pt presents with concerns for weakness onset today. Vital signs, pt afebrile, patient initially hypotensive at 87/50, hypoxic in the 80s.  Patient blood pressure improved to 142 systolically at time of admission.  Patient placed on 2 L via nasal cannula with O2 improvement to 98%. On exam, pt with no acute cardiovascular, respiratory, abdominal exam findings. Differential diagnosis includes hypoglycemia, anemia, electrolyte abnormality, arrhythmia, pneumonia.   Co morbidities that complicate the patient evaluation: BPH, multiple myeloma  Additional history obtained:  Additional history obtained from Daughter/Son  Labs:  I ordered, and personally interpreted labs.  The pertinent results include:   Coags obtained with INR at 1.5, PT at 18.3 CBC with leukopenia at 1.9, hemoglobin  at 8.9 down trended from 11.1  5 days ago. Urinalysis unable to be obtained due to hematuria after in and out catheter APTT unremarkable Negative COVID, flu, RSV CMP with slightly elevated BUN at 31, otherwise unremarkable  Imaging: I ordered imaging studies including chest x-ray, CTA chest I independently visualized and interpreted imaging which showed: Chest x-ray with  Multiple bilateral rib lytic lesions are again noted consistent with  metastatic disease. Bibasilar subsegmental atelectasis is noted.   CTA chest with  1. Negative for acute pulmonary embolus. Nondiagnostic for  assessment of bilateral lower lobe distal segmental and subsegmental  PE due to motion and inadequate opacification.  2. Small bilateral pleural effusions with partial lower lobe  consolidations, atelectasis versus pneumonia. Bandlike scarring and  mild bronchiectasis in the right upper lobe.  3. Extensive skeletal lytic disease corresponding to history of  myeloma, appears slightly progressive as compared with January CT.  4. Aortic atherosclerosis.    Aortic Atherosclerosis (ICD10-I70.0).   I agree with the radiologist interpretation  Medications:  I ordered medication including IV fluids, vancomycin, cefepime, Zithromax for symptom management Reevaluation of the patient after these medicines and interventions, I reevaluated the patient and found that they have improved I have reviewed the patients home medicines and have made adjustments as needed  Consultations: I requested consultation with the hospitalist, Dr. Mariea Clonts, and discussed lab and imaging findings as well as pertinent plan - they recommend: CTA chest and reconsultation for consideration of admission -Consultation with Dr. Mariea Clonts and reviewed CTA chest with hospitalist.  Hospitalist will evaluate for admission at this time.   Disposition: Presented suspicious for generalized weakness in the setting of pneumonia.  Doubt concerns this time  for COVID, flu, RSV.  Doubt concerns at this time for PE, hypoglycemia, electrolyte abnormality, arrhythmia. Patient with blood in urine following initial catheter attempts. PVR showed over 1L of urine noted. Catheter placed. Case discussed with Attending Dr. Estell Harpin who evaluated patient and agrees with treatment plan of admission with recommendations as per Hospitalist. After consideration of the diagnostic results and the patients response to treatment, I feel that the patient would benefit from Admission to the hospital.  Discussed with patient and daughter at bedside regarding plans for admission.  Patient and daughter agreeable at this time for admission.  Patient appears safe for admission at this time.   This chart was dictated using voice recognition software, Dragon. Despite the best efforts of this provider to proofread and correct errors, errors may still occur which can change documentation meaning.  Final Clinical Impression(s) / ED Diagnoses Final diagnoses:  Generalized weakness  Community acquired pneumonia, unspecified laterality    Rx / DC Orders ED Discharge Orders     None         Yvetta Drotar A, PA-C 08/19/22 1726    Deziah Renwick A, PA-C 08/19/22 1834    Trinka Keshishyan A, PA-C 08/19/22 1844    Bethann Berkshire, MD 08/20/22 [phone]

## 2022-08-19 NOTE — ED Triage Notes (Signed)
Pt BIB [facility] for c/o weakness and hypoxia; pt was found to have O2 sats of 85% on RA;  EMS placed O2 at 4L [place] and sats increased to 96%  BP with ems 93/60 and decreased to 78/54, ems gave approximately 100NS   CBG 114  Pt falling asleep during triage  Pt had radiation treatment yesterday

## 2022-08-19 NOTE — Assessment & Plan Note (Addendum)
Rules in for sepsis-leukopenia of 1.9, and hypotension blood pressure down to 79/52..  Lactic acid 1.1.  Presenting with generalized weakness, dizziness, dry cough.  CTA chest-small bilateral pleural effusions with partial lower lobe consolidation, atelectasis versus pneumonia.  Poor oral intake over the past couple of days.  Likely contributing to hypotension. - 3 L Bolus given, n/s + 20 kcl 75cc/hr x 15hrs -Continue with IV ceftriaxone and azithromycin -Follow-up blood cultures -  Heart rate increased to low 100s likely from uncontrolled pain, EKG showing irregular rhythm possibly atrial fibrillation, P waves occasionally present.  -Repeat EKG in a.m.

## 2022-08-19 NOTE — ED Notes (Signed)
Per Dr.Emokpae pt will be ED to ED transfer. Dr.Trifon @ [facility] has accepted the patient.   Report called to ED Charge nurse & carelink aware of transfer request.

## 2022-08-19 NOTE — Progress Notes (Signed)
Pharmacy Antibiotic Note  Dean [name]. is a 87 y.o. male admitted on 08/19/2022 with sepsis.  Pharmacy has been consulted for Vancomycin dosing.  Plan: Vancomycin 1750mg  IV loading dose, then 1500mg  IV Q 24 hrs. Goal AUC 400-550. Expected AUC: 467 SCr used: 0.83  F/U cxs and clinical progress Monitor V/S, labs and levels as indicated  Height: 5\' 8"  (172.7 cm) Weight: 87 kg (191 lb 12.8 oz) IBW/kg (Calculated) : 68.4  Temp (24hrs), Avg:98.3 F (36.8 C), Min:98.2 F (36.8 C), Max:98.4 F (36.9 C)  Recent Labs  Lab 08/14/22 0905 08/19/22 1110  WBC 4.1  --   CREATININE 0.60* 0.83  LATICACIDVEN  --  1.1    Estimated Creatinine Clearance: 69.8 mL/min (by C-G formula based on SCr of 0.83 mg/dL).    Allergies  Allergen Reactions   Codeine Other (See Comments)    Headache   Morphine And Codeine Other (See Comments)    Makes him feel weird   Revlimid [Lenalidomide] Other (See Comments)    "Causes me to become weak"    Antimicrobials this admission: Vancomycin 7/16 >>  Cefepime  7/16  Microbiology results: 7/16 BCx: pending  MRSA PCR:   Thank you for allowing pharmacy to be a part of this patient's care.  Elder Cyphers, BS Pharm D, BCPS Clinical Pharmacist 08/19/2022 11:37 AM

## 2022-08-19 NOTE — Assessment & Plan Note (Signed)
Sats down to 85% on room air, requiring 2 L.  CTA chest negative for PE, suggests partial lower lobe consolidations-atelectasis or pneumonia.

## 2022-08-19 NOTE — Assessment & Plan Note (Addendum)
With urethral bleeding, will hold Eliquis.

## 2022-08-19 NOTE — Telephone Encounter (Signed)
Daughter called in to cancel patients radiation treatment for today. Per daughter patient is being transported to [facility] ED after having an unresponsive episode this morning. [place] [facility] made aware.

## 2022-08-19 NOTE — Assessment & Plan Note (Addendum)
Bladder outlet obstruction, initial in and out cath met resistance, subsequent coud placed.  But patient has been draining blood from around the coud catheter since placement.  Initially about 1 L of urine drained, ~ 11 a.m. ~ 50 - of blood-tinged urine in urine bag presently, no subsequent urine output, failed attempted irrigation. -Check CBC -I talked to Dr. Alvester Morin with urology, will do an ED to ED transfer for urgent urology evaluation or hospital floor if bed available. -I talked to Dr. Renaye Rakers, who has graciously accepted patient to [facility], ED. -Hold Eliquis -Resume tamsulosin

## 2022-08-19 NOTE — H&P (Addendum)
History and Physical    [name]. ZOX:[id number] DOB: [birth year] DOA: 08/19/2022  PCP: Donita Brooks, MD   Patient coming from: Home  I have personally briefly reviewed patient's old medical records in [facility]  Chief Complaint: Weakness, Unresponsiveness  HPI: Dean [name]. is a 87 y.o. male with medical history significant for multiple myeloma, BPH, pulmonary embolism on anticoagulation. Patient presented to the ED with complaints of generalized weakness, and reported hypoxia O2 sats in the 80s on room air.  Also reports of dizziness/lightheadedness. Daughter Dean [name] is at bedside, she reports patient's dose of polymsyt was increased from 2 to 4 mg, first 4mg  dose was on the 12th.  With the 2 mg- when he takes it, he becomes very weak for the past couple of days.  Today he was unable to get up, not responding, he was so weak he was slurring his speech.  He has had a mild dry cough.  No difficulty breathing.  On my evaluation he is now more awake, but currently his is intermittently groaning in pain from his penis.    On EMS arrival, patient's O2 sats were in the 80s, on room air, with blood pressure down to 79/54.  ED Course: Tmax 98.3.  Heart rate 60s to 80s.  Respiratory rate 9-16.  Blood pressure systolic down to 81/19 improved currently 140s.  O2 sats down to 85% on room air, he was placed on 2 L. WBC 1.9.  Lactic acid 1.1. COVID/influenza/RSV  test negative. UA test not reported due to interference of urine pigments. In and out Cath attempted, met with resistance, coude finally placed- with drainage of 1 L urine, but patient has been oozing blood since placement of Coude. CTA chest negative for PE, shows small bilateral pleural effusions with partial lower lobe consolidations-atelectasis or pneumonia.  Extensive skeletal lytic lesions-slightly progressed from prior CT in January. IV Vanc and cefepime given, with CT chest findings, azithromycin was added. 3 L  bolus given.  Review of Systems: As per HPI all other systems reviewed and negative.  Past Medical History:  Diagnosis Date   BPH (benign prostatic hyperplasia)    Colonic diverticular abscess 01/08/2015   Diverticulitis 12/22/2014   complicated by abscess and required percutaneous drainage   GERD (gastroesophageal reflux disease)    H/O ETOH abuse    History of chemotherapy last done jan 2017   History of radiation therapy 01/05/13-02/10/13   45 gray to left occipital condyle region   History of radiation therapy 12/01/16-12/10/16   Parasternal nodule, chest- 24 Gy total delivered in 8 fractions, Left sacro-iliac, pelvis- 24 Gy total delivered in 8 fractions    History of radiation therapy 02/19/17-02/22/17   right temporal scalp 30 Gy in 10 fractions   History of radiation therapy    Right hip( Pelvis) 11/18/21-11/21/21-Dr. Antony Blackbird   Intra-abdominal abscess (HCC)    Multiple myeloma (HCC) 2015   Neuropathy    Radiation 02/21/14-03/08/14   right posterior chest wall area 30 gray   Radiation 04/19/14-05/02/14   lumbar spine 25 gray   Skull lesion    Left occipital condyle   Wrist fracture, left    x 2    Past Surgical History:  Procedure Laterality Date   COLONOSCOPY N/A 04/19/2015   Procedure: COLONOSCOPY;  Surgeon: West Bali, MD;  Location: [facility];  Service: Endoscopy;  Laterality: N/A;  1:30 PM   CYST EXCISION  1959   tail bone  IR IMAGING GUIDED PORT INSERTION  09/25/2017     reports that he has never smoked. He has never used smokeless tobacco. He reports that he does not drink alcohol and does not use drugs.  Allergies  Allergen Reactions   Codeine Other (See Comments)    Headache   Morphine And Codeine Other (See Comments)    Makes him feel weird   Revlimid [Lenalidomide] Other (See Comments)    "Causes me to become weak"    Family History  Problem Relation Age of Onset   Diabetes Mother    Stroke Mother    Hypertension Father     Prior to  Admission medications   Medication Sig Start Date End Date Taking? Authorizing Provider  acyclovir (ZOVIRAX) 400 MG tablet Take 1 tablet (400 mg total) by mouth 2 (two) times daily. 04/21/22   Jeralyn Ruths, MD  ALPRAZolam (XANAX) 1 MG tablet TAKE ONE TABLET (1 MG TOTAL) BY MOUTH THREE (THREE) TIMES DAILY AS NEEDED. FOR ANXIETY 07/28/22   Borders, Daryl Eastern, NP  apixaban (ELIQUIS) 5 MG TABS tablet TAKE TWO TABLETS BY MOUTH (10MG  TOTAL) TWICE A DAY. THEN TAKE ONE TABLET (5MG ) TWICE A DAY STARTING ON 02/19/22 04/21/22   Jeralyn Ruths, MD  CALCIUM-VITAMIN D PO Take 1 tablet by mouth 2 (two) times daily.     [provider]  cyanocobalamin (VITAMIN B12) 500 MCG tablet Take 1 tablet (500 mcg total) by mouth daily. 02/13/22   Catarina Hartshorn, MD  dexamethasone (DECADRON) 4 MG tablet TAKE ONE TABLET (4 MG TOTAL) BY MOUTH DAILY. 07/28/22   Borders, Daryl Eastern, NP  fluticasone (FLONASE) 50 MCG/ACT nasal spray PLACE 2 SPRAYS INTO BOTH NOSTRILS DAILY 03/03/22   Donita Brooks, MD  folic acid (FOLVITE) 1 MG tablet Take 1 tablet (1 mg total) by mouth daily. 05/19/22   Jeralyn Ruths, MD  furosemide (LASIX) 20 MG tablet Take 1 tablet (20 mg total) by mouth daily as needed. 04/22/22   Jeralyn Ruths, MD  gabapentin (NEURONTIN) 400 MG capsule Take 1 capsule (400 mg total) by mouth 3 (three) times daily. 04/21/22   Jeralyn Ruths, MD  Homeopathic Products [facility] RELIEF EX) Apply topically as needed.    [provider]  lidocaine-prilocaine (EMLA) cream Apply small amount over port one (1) hour prior to appointment. 09/17/19   Doreatha Massed, MD  montelukast (SINGULAIR) 10 MG tablet TAKE 1 TABLET BY MOUTH EVERY NIGHT AT BEDTIME 03/03/22   Donita Brooks, MD  Oxycodone HCl 10 MG TABS TAKE ONE TO TWO TABLETS BY MOUTH THREE TIMES DAILY AS NEEDED 07/28/22   Borders, Daryl Eastern, NP  pomalidomide (POMALYST) 4 MG capsule Take 1 capsule (4 mg total) by mouth daily. Take for 21 days, then  hold for 7 days. Repeat every 28 days. 08/04/22   Jeralyn Ruths, MD  tamsulosin (FLOMAX) 0.4 MG CAPS capsule TAKE ONE CAPSULE BY MOUTH EVERY NIGHT ATBEDTIME 12/09/21   Donita Brooks, MD  traZODone (DESYREL) 100 MG tablet TAKE THREE TABLETS BY MOUTH EVERY NIGHT AT BEDTIME 07/28/22   Borders, Daryl Eastern, NP  TURMERIC PO Take 1 tablet by mouth 2 (two) times daily.    [provider]    Physical Exam: Vitals:   08/19/22 1215 08/19/22 1315 08/19/22 1330 08/19/22 1530  BP: (!) 113/58 (!) 102/53 103/64 (!) 142/98  Pulse: 74 67 66 78  Resp: 16 14 13 16   Temp:      TempSrc:  SpO2: 99% 100% 100% 98%  Weight:      Height:        Constitutional: Intermittently groaning in pain from his penis, but able to respond Vitals:   08/19/22 1215 08/19/22 1315 08/19/22 1330 08/19/22 1530  BP: (!) 113/58 (!) 102/53 103/64 (!) 142/98  Pulse: 74 67 66 78  Resp: 16 14 13 16   Temp:      TempSrc:      SpO2: 99% 100% 100% 98%  Weight:      Height:       Eyes: PERRL, lids and conjunctivae normal ENMT: Mucous membranes are moist.  Neck: normal, supple, no masses, no thyromegaly Respiratory: Anterior auscultation - clear to auscultation bilaterally, no wheezing, no crackles. Normal respiratory effort. No accessory muscle use.  Cardiovascular: Regular rate and rhythm, no murmurs / rubs / gallops. No extremity edema.  Abdomen: no tenderness, no masses palpated. No hepatosplenomegaly. Bowel sounds positive.  Musculoskeletal: no clubbing / cyanosis. No joint deformity upper and lower extremities.  Skin: Bulb of the penis is swollen, continuously oozing blood, around the coud catheter. ~ 50- 100 mls blood-tinged urine in urine bag.  Attempted irrigation twice, able to push fluid through, but unable to withdraw any urine or fluid. Neurologic: No facial asymmetry, moving extremities spontaneously.  Psychiatric: Normal judgment and insight. Alert and oriented x 3. Normal mood.   Labs on Admission:  I have personally reviewed following labs and imaging studies  CBC: Recent Labs  Lab 08/14/22 0905 08/19/22 1100  WBC 4.1 1.9*  NEUTROABS 2.7  --   HGB 11.1* 8.9*  HCT 35.6* 28.2*  MCV 98.1 98.3  PLT 166 94*   Basic Metabolic Panel: Recent Labs  Lab 08/14/22 0905 08/19/22 1110  NA 135 136  K 3.3* 3.7  CL 105 107  CO2 23 25  GLUCOSE 128* 110*  BUN 26* 31*  CREATININE 0.60* 0.83  CALCIUM 7.9* 7.8*   GFR: Estimated Creatinine Clearance: 69.8 mL/min (by C-G formula based on SCr of 0.83 mg/dL). Liver Function Tests: Recent Labs  Lab 08/14/22 0905 08/19/22 1110  AST 19 18  ALT 14 17  ALKPHOS 26* 29*  BILITOT 0.4 0.5  PROT 7.1 6.2*  ALBUMIN 2.5* 1.8*   Coagulation Profile: Recent Labs  Lab 08/19/22 1110  INR 1.5*   CBG: Recent Labs  Lab 08/19/22 1117  GLUCAP 103*   Urine analysis:    Component Value Date/Time   COLORURINE RED (A) 08/19/2022 1103   APPEARANCEUR TURBID (A) 08/19/2022 1103   LABSPEC  08/19/2022 1103    TEST NOT REPORTED DUE TO COLOR INTERFERENCE OF URINE PIGMENT   PHURINE  08/19/2022 1103    TEST NOT REPORTED DUE TO COLOR INTERFERENCE OF URINE PIGMENT   GLUCOSEU (A) 08/19/2022 1103    TEST NOT REPORTED DUE TO COLOR INTERFERENCE OF URINE PIGMENT   HGBUR (A) 08/19/2022 1103    TEST NOT REPORTED DUE TO COLOR INTERFERENCE OF URINE PIGMENT   BILIRUBINUR (A) 08/19/2022 1103    TEST NOT REPORTED DUE TO COLOR INTERFERENCE OF URINE PIGMENT   BILIRUBINUR negative 07/18/2021 0841   KETONESUR (A) 08/19/2022 1103    TEST NOT REPORTED DUE TO COLOR INTERFERENCE OF URINE PIGMENT   PROTEINUR (A) 08/19/2022 1103    TEST NOT REPORTED DUE TO COLOR INTERFERENCE OF URINE PIGMENT   UROBILINOGEN 0.2 07/18/2021 0841   UROBILINOGEN 0.2 01/02/2014 1103   NITRITE (A) 08/19/2022 1103    TEST NOT REPORTED DUE TO COLOR INTERFERENCE OF  URINE PIGMENT   LEUKOCYTESUR (A) 08/19/2022 1103    TEST NOT REPORTED DUE TO COLOR INTERFERENCE OF URINE PIGMENT     Radiological Exams on Admission: CT Angio Chest PE W and/or Wo Contrast  Result Date: 08/19/2022 CLINICAL DATA:  Weakness hypoxia EXAM: CT ANGIOGRAPHY CHEST WITH CONTRAST TECHNIQUE: Multidetector CT imaging of the chest was performed using the standard protocol during bolus administration of intravenous contrast. Multiplanar CT image reconstructions and MIPs were obtained to evaluate the vascular anatomy. RADIATION DOSE REDUCTION: This exam was performed according to the departmental dose-optimization program which includes automated exposure control, adjustment of the mA and/or kV according to patient size and/or use of iterative reconstruction technique. CONTRAST:  75mL OMNIPAQUE IOHEXOL 350 MG/ML SOLN COMPARISON:  Chest x-ray 08/19/2022, CT chest 02/10/2022 FINDINGS: Cardiovascular: Satisfactory opacification of the pulmonary arteries to the segmental level. No evidence of pulmonary embolism. Nondiagnostic for assessment of bilateral lower lobe distal segmental and subsegmental PE due to motion and inadequate opacification. Cardiomegaly. Moderate aortic atherosclerosis. No aneurysm. Coronary vascular calcification. Mitral calcification. No pericardial effusion Mediastinum/Nodes: Midline trachea. 1.4 cm left thyroid nodule, no imaging follow-up is recommended. Subcentimeter right paratracheal nodes. Esophagus within normal limits Lungs/Pleura: Small bilateral pleural effusions. Similar lower lobe parenchymal calcifications. Partial lower lobe consolidations. Bandlike scarring in the right upper lobe with mild bronchiectasis. Upper Abdomen: No acute finding Musculoskeletal: Extensive skeletal metastatic disease with numerous lytic lesions involving the sternum, ribs, spine, and shoulders, appears slightly progressive as compared with January CT. Multiple old appearing bilateral rib fractures and old left clavicle fracture. Review of the MIP images confirms the above findings. IMPRESSION: 1. Negative for  acute pulmonary embolus. Nondiagnostic for assessment of bilateral lower lobe distal segmental and subsegmental PE due to motion and inadequate opacification. 2. Small bilateral pleural effusions with partial lower lobe consolidations, atelectasis versus pneumonia. Bandlike scarring and mild bronchiectasis in the right upper lobe. 3. Extensive skeletal lytic disease corresponding to history of myeloma, appears slightly progressive as compared with January CT. 4. Aortic atherosclerosis. Aortic Atherosclerosis (ICD10-I70.0). Electronically Signed   By: Jasmine Pang M.D.   On: 08/19/2022 16:41   DG Chest Port 1 View  Result Date: 08/19/2022 CLINICAL DATA:  Shortness of breath, hypoxia. EXAM: PORTABLE CHEST 1 VIEW COMPARISON:  February 10, 2022. FINDINGS: Stable cardiomediastinal silhouette. Right internal jugular Port-A-Cath is unchanged in position. There are again noted multiple bilateral rib lytic lesions consistent with metastatic disease. Minimal bibasilar subsegmental atelectasis is again noted. IMPRESSION: Multiple bilateral rib lytic lesions are again noted consistent with metastatic disease. Bibasilar subsegmental atelectasis is noted. Electronically Signed   By: Lupita Raider M.D.   On: 08/19/2022 12:14    EKG: Independently reviewed.  Sinus rhythm, rate 59, QTc 408.  PACs.  No significant change from prior.  Assessment/Plan Principal Problem:   Acute hypoxic respiratory failure (HCC) Active Problems:   BPH (benign prostatic hyperplasia)   PNA (pneumonia)   Urethral bleeding   Multiple myeloma without remission (HCC)   Pulmonary embolism (HCC)  Assessment and Plan: * Acute hypoxic respiratory failure (HCC) Sats down to 85% on room air, requiring 2 L.  CTA chest negative for PE, suggests partial lower lobe consolidations-atelectasis or pneumonia.  PNA (pneumonia) Rules in for sepsis-leukopenia of 1.9, and hypotension blood pressure down to 79/52..  Lactic acid 1.1.  Presenting with  generalized weakness, dizziness, dry cough.  CTA chest-small bilateral pleural effusions with partial lower lobe consolidation, atelectasis versus pneumonia.  Poor oral intake over the past  couple of days.  Likely contributing to hypotension. - 3 L Bolus given, n/s + 20 kcl 75cc/hr x 15hrs -Continue with IV ceftriaxone and azithromycin -Follow-up blood cultures -  Heart rate increased to low 100s likely from uncontrolled pain, EKG showing irregular rhythm possibly atrial fibrillation, P waves occasionally present.  -Repeat EKG in a.m.  BPH (benign prostatic hyperplasia)/urethral bleeding Bladder outlet obstruction, initial in and out cath met resistance, subsequent coud placed.  But patient has been draining blood from around the coud catheter since placement.  Initially about 1 L of urine drained, ~ 11 a.m. ~ 50 - of blood-tinged urine in urine bag presently, no subsequent urine output, failed attempted irrigation. -Check CBC -I talked to Dr. Alvester Morin with urology, will do an ED to ED transfer for urgent urology evaluation or hospital floor if bed available. -I talked to Dr. Renaye Rakers, who has graciously accepted patient to [facility], ED. -Hold Eliquis -Resume tamsulosin  Multiple myeloma without remission (HCC) Follows with Dr. Orlie Dakin.  Currently on chemotherapy with Pomalyst, dose of which was recently increased from 2 to 4 mg.  He is also receiving radiation treatment to his right hip/pelvis.  Pulmonary embolism (HCC) With urethral bleeding, will hold Eliquis.   DVT prophylaxis: SCDS Code Status: DNR, confirmed with patient and daughter Dean [name] at bedside. Family Communication: Daughter Dean [name] is HCPOA. Disposition Plan: ~ 2 days Consults called: urology Admission status: Inpt tele I certify that at the point of admission it is my clinical judgment that the patient will require inpatient hospital care spanning beyond 2 midnights from the point of admission due to high intensity  of service, high risk for further deterioration and high frequency of surveillance required.    Author: Onnie Boer, MD 08/19/2022 7:41 PM  For on call review [email or web address].

## 2022-08-19 NOTE — ED Notes (Signed)
Pt given 1mg  narcan per MD order. Pt had minimal response.

## 2022-08-20 ENCOUNTER — Encounter (HOSPITAL_COMMUNITY): Payer: Self-pay | Admitting: Internal Medicine

## 2022-08-20 ENCOUNTER — Encounter (HOSPITAL_COMMUNITY)
Admission: EM | Disposition: A | Discharge status: Home Health | Payer: Self-pay | Source: Home / Self Care | Attending: Family Medicine

## 2022-08-20 ENCOUNTER — Inpatient Hospital Stay (HOSPITAL_COMMUNITY): Payer: Medicare Other

## 2022-08-20 ENCOUNTER — Telehealth: Payer: Self-pay | Admitting: *Deleted

## 2022-08-20 ENCOUNTER — Ambulatory Visit: Payer: Medicare Other

## 2022-08-20 ENCOUNTER — Inpatient Hospital Stay (HOSPITAL_COMMUNITY): Payer: Medicare Other | Admitting: Anesthesiology

## 2022-08-20 DIAGNOSIS — N401 Enlarged prostate with lower urinary tract symptoms: Secondary | ICD-10-CM

## 2022-08-20 DIAGNOSIS — J9601 Acute respiratory failure with hypoxia: Secondary | ICD-10-CM | POA: Diagnosis not present

## 2022-08-20 DIAGNOSIS — R338 Other retention of urine: Secondary | ICD-10-CM

## 2022-08-20 DIAGNOSIS — R310 Gross hematuria: Secondary | ICD-10-CM

## 2022-08-20 DIAGNOSIS — C9000 Multiple myeloma not having achieved remission: Secondary | ICD-10-CM | POA: Diagnosis not present

## 2022-08-20 DIAGNOSIS — N368 Other specified disorders of urethra: Secondary | ICD-10-CM | POA: Diagnosis not present

## 2022-08-20 DIAGNOSIS — J189 Pneumonia, unspecified organism: Secondary | ICD-10-CM | POA: Diagnosis not present

## 2022-08-20 HISTORY — PX: CYSTOSCOPY WITH FULGERATION: SHX6638

## 2022-08-20 LAB — POCT I-STAT, CHEM 8
BUN: 59 mg/dL — ABNORMAL HIGH (ref 8–23)
Calcium, Ion: 1.03 mmol/L — ABNORMAL LOW (ref 1.15–1.40)
Chloride: 112 mmol/L — ABNORMAL HIGH (ref 98–111)
Creatinine, Ser: 1.1 mg/dL (ref 0.61–1.24)
Glucose, Bld: 120 mg/dL — ABNORMAL HIGH (ref 70–99)
HCT: 20 % — ABNORMAL LOW (ref 39.0–52.0)
Hemoglobin: 6.8 g/dL — CL (ref 13.0–17.0)
Potassium: 5.6 mmol/L — ABNORMAL HIGH (ref 3.5–5.1)
Sodium: 141 mmol/L (ref 135–145)
TCO2: 20 mmol/L — ABNORMAL LOW (ref 22–32)

## 2022-08-20 LAB — CBC
HCT: 22.6 % — ABNORMAL LOW (ref 39.0–52.0)
Hemoglobin: 6.7 g/dL — CL (ref 13.0–17.0)
MCH: 29.6 pg (ref 26.0–34.0)
MCHC: 29.6 g/dL — ABNORMAL LOW (ref 30.0–36.0)
MCV: 100 fL (ref 80.0–100.0)
Platelets: 102 10*3/uL — ABNORMAL LOW (ref 150–400)
RBC: 2.26 MIL/uL — ABNORMAL LOW (ref 4.22–5.81)
RDW: 17.5 % — ABNORMAL HIGH (ref 11.5–15.5)
WBC: 4.1 10*3/uL (ref 4.0–10.5)
nRBC: 0 % (ref 0.0–0.2)

## 2022-08-20 LAB — PREPARE RBC (CROSSMATCH)

## 2022-08-20 LAB — BASIC METABOLIC PANEL
Anion gap: 7 (ref 5–15)
BUN: 39 mg/dL — ABNORMAL HIGH (ref 8–23)
CO2: 20 mmol/L — ABNORMAL LOW (ref 22–32)
Calcium: 7 mg/dL — ABNORMAL LOW (ref 8.9–10.3)
Chloride: 112 mmol/L — ABNORMAL HIGH (ref 98–111)
Creatinine, Ser: 1.15 mg/dL (ref 0.61–1.24)
GFR, Estimated: >60 mL/min (ref >60)
Glucose, Bld: 113 mg/dL — ABNORMAL HIGH (ref 70–99)
Potassium: 4.5 mmol/L (ref 3.5–5.1)
Sodium: 139 mmol/L (ref 135–145)

## 2022-08-20 SURGERY — CYSTOSCOPY, WITH BLADDER FULGURATION
Anesthesia: General | Site: Bladder

## 2022-08-20 MED ORDER — STERILE WATER FOR IRRIGATION IR SOLN
Status: DC | PRN
  Administered 2022-08-20: 250 mL

## 2022-08-20 MED ORDER — SODIUM CHLORIDE 0.9% IV SOLUTION: Freq: Once | INTRAVENOUS | Status: AC

## 2022-08-20 MED ORDER — ROCURONIUM BROMIDE 100 MG/10ML IV SOLN
INTRAVENOUS | Status: DC | PRN
  Administered 2022-08-20: 50 mg via INTRAVENOUS

## 2022-08-20 MED ORDER — PROPOFOL 10 MG/ML IV BOLUS
INTRAVENOUS | Status: DC | PRN
  Administered 2022-08-20: 110 mg via INTRAVENOUS

## 2022-08-20 MED ORDER — OXYCODONE HCL 5 MG PO TABS: 5.0000 mg | ORAL_TABLET | Freq: Once | ORAL | Status: DC | PRN

## 2022-08-20 MED ORDER — VASOPRESSIN 20 UNIT/ML IV SOLN
INTRAVENOUS | Status: AC
  Filled 2022-08-20: qty 1

## 2022-08-20 MED ORDER — PHENYLEPHRINE 80 MCG/ML (10ML) SYRINGE FOR IV PUSH (FOR BLOOD PRESSURE SUPPORT)
PREFILLED_SYRINGE | INTRAVENOUS | Status: DC | PRN
  Administered 2022-08-20: 320 ug via INTRAVENOUS
  Administered 2022-08-20 (×2): 240 ug via INTRAVENOUS

## 2022-08-20 MED ORDER — SODIUM ZIRCONIUM CYCLOSILICATE 10 G PO PACK
10.0000 g | PACK | Freq: Once | ORAL | Status: AC
  Administered 2022-08-20: 10 g via ORAL
  Filled 2022-08-20: qty 1

## 2022-08-20 MED ORDER — FENTANYL CITRATE (PF) 100 MCG/2ML IJ SOLN
INTRAMUSCULAR | Status: AC
  Filled 2022-08-20: qty 2

## 2022-08-20 MED ORDER — SODIUM CHLORIDE 0.9 % IV SOLN: 10.0000 mL/h | Freq: Once | INTRAVENOUS | Status: DC

## 2022-08-20 MED ORDER — ORAL CARE MOUTH RINSE: 15.0000 mL | OROMUCOSAL | Status: DC | PRN

## 2022-08-20 MED ORDER — PROPOFOL 10 MG/ML IV BOLUS
INTRAVENOUS | Status: AC
  Filled 2022-08-20: qty 20

## 2022-08-20 MED ORDER — LACTATED RINGERS IV SOLN: INTRAVENOUS | Status: DC | PRN

## 2022-08-20 MED ORDER — OXYCODONE HCL 5 MG/5ML PO SOLN: 5.0000 mg | Freq: Once | ORAL | Status: DC | PRN

## 2022-08-20 MED ORDER — SUCCINYLCHOLINE CHLORIDE 200 MG/10ML IV SOSY
PREFILLED_SYRINGE | INTRAVENOUS | Status: DC | PRN
  Administered 2022-08-20: 120 mg via INTRAVENOUS

## 2022-08-20 MED ORDER — ONDANSETRON HCL 4 MG/2ML IJ SOLN
INTRAMUSCULAR | Status: DC | PRN
  Administered 2022-08-20: 4 mg via INTRAVENOUS

## 2022-08-20 MED ORDER — PROMETHAZINE HCL 25 MG/ML IJ SOLN: 6.2500 mg | INTRAMUSCULAR | Status: DC | PRN

## 2022-08-20 MED ORDER — AMISULPRIDE (ANTIEMETIC) 5 MG/2ML IV SOLN: 10.0000 mg | Freq: Once | INTRAVENOUS | Status: DC | PRN

## 2022-08-20 MED ORDER — HYDROMORPHONE HCL 1 MG/ML IJ SOLN: 0.2500 mg | INTRAMUSCULAR | Status: DC | PRN

## 2022-08-20 MED ORDER — LIDOCAINE HCL (CARDIAC) PF 100 MG/5ML IV SOSY
PREFILLED_SYRINGE | INTRAVENOUS | Status: DC | PRN
  Administered 2022-08-20: 100 mg via INTRAVENOUS

## 2022-08-20 MED ORDER — SUGAMMADEX SODIUM 200 MG/2ML IV SOLN
INTRAVENOUS | Status: DC | PRN
  Administered 2022-08-20: 400 mg via INTRAVENOUS

## 2022-08-20 MED ORDER — SODIUM CHLORIDE 0.9% FLUSH
10.0000 mL | INTRAVENOUS | Status: DC | PRN
  Administered 2022-08-24: 10 mL

## 2022-08-20 MED ORDER — VASOPRESSIN 20 UNIT/ML IV SOLN
INTRAVENOUS | Status: DC | PRN
  Administered 2022-08-20 (×3): 1 [IU] via INTRAVENOUS
  Administered 2022-08-20 (×4): 2 [IU] via INTRAVENOUS

## 2022-08-20 MED ORDER — SODIUM CHLORIDE 0.9 % IR SOLN
Status: DC | PRN
  Administered 2022-08-20 (×2): 6000 mL

## 2022-08-20 MED ORDER — CHLORHEXIDINE GLUCONATE CLOTH 2 % EX PADS
6.0000 | MEDICATED_PAD | Freq: Every day | CUTANEOUS | Status: DC
  Administered 2022-08-20 – 2022-08-24 (×5): 6 via TOPICAL

## 2022-08-20 MED ORDER — ACETAMINOPHEN 10 MG/ML IV SOLN
INTRAVENOUS | Status: AC
  Filled 2022-08-20: qty 100

## 2022-08-20 MED ORDER — FENTANYL CITRATE (PF) 100 MCG/2ML IJ SOLN
INTRAMUSCULAR | Status: DC | PRN
  Administered 2022-08-20 (×2): 25 ug via INTRAVENOUS
  Administered 2022-08-20: 50 ug via INTRAVENOUS

## 2022-08-20 MED ORDER — DILTIAZEM HCL-DEXTROSE 125-5 MG/125ML-% IV SOLN (PREMIX)
5.0000 mg/h | INTRAVENOUS | Status: DC
  Administered 2022-08-20: 5 mg/h via INTRAVENOUS
  Filled 2022-08-20 (×2): qty 125

## 2022-08-20 MED ORDER — ACETAMINOPHEN 10 MG/ML IV SOLN
INTRAVENOUS | Status: DC | PRN
  Administered 2022-08-20: 1000 mg via INTRAVENOUS

## 2022-08-20 SURGICAL SUPPLY — 21 items
BAG DRN RND TRDRP ANRFLXCHMBR (UROLOGICAL SUPPLIES)
BAG URINE DRAIN 2000ML AR STRL (UROLOGICAL SUPPLIES) IMPLANT
BAG URO CATCHER STRL LF (MISCELLANEOUS) ×1 IMPLANT
CATH FOLEY 2WAY SLVR 5CC 18FR (CATHETERS) IMPLANT
CATH FOLEY 3WAY 30CC 24FR (CATHETERS)
CATH HEMA 3WAY 30CC 24FR COUDE (CATHETERS) IMPLANT
CATH URTH STD 24FR FL 3W 2 (CATHETERS) IMPLANT
DRAPE FOOT SWITCH (DRAPES) ×1 IMPLANT
ELECT REM PT RETURN 15FT ADLT (MISCELLANEOUS) ×1 IMPLANT
GLOVE BIO SURGEON STRL SZ7.5 (GLOVE) ×1 IMPLANT
GOWN STRL REUS W/ TWL XL LVL3 (GOWN DISPOSABLE) ×1 IMPLANT
GOWN STRL REUS W/TWL XL LVL3 (GOWN DISPOSABLE) ×1
KIT TURNOVER KIT A (KITS) IMPLANT
LOOP CUT BIPOLAR 24F LRG (ELECTROSURGICAL) IMPLANT
MANIFOLD NEPTUNE II (INSTRUMENTS) ×1 IMPLANT
PACK CYSTO (CUSTOM PROCEDURE TRAY) ×1 IMPLANT
PLUG CATH AND CAP STRL 200 (CATHETERS) IMPLANT
SYR TOOMEY IRRIG 70ML (MISCELLANEOUS)
SYRINGE TOOMEY IRRIG 70ML (MISCELLANEOUS) IMPLANT
TUBING CONNECTING 10 (TUBING) ×1 IMPLANT
TUBING UROLOGY SET (TUBING) ×1 IMPLANT

## 2022-08-20 NOTE — Treatment Plan (Signed)
UROLOGY TREATMENT PLAN NOTE CT scan performed this morning with evidence of significant clot burden in the bladder.  No obvious free fluid on my read of the CT scan.  Hemoglobin this morning 6.7 from 8.6.  He is getting 1 unit.  At this time, we posted him for OR case for a cystoscopy with clot EVAC and fulguration.  At the bedside, we are unable to pull clot off his catheter.  Plan to go to the OR today around 1 PM.  Continue n.p.o. status  Discussed plan with daughter and she is in agreement.  Discussed with Dr. Arlyce Harman, MD, PhD [facility] Resident  [facility]

## 2022-08-20 NOTE — Transfer of Care (Signed)
Immediate Anesthesia Transfer of Care Note  Patient: Dean [name].  Procedure(s) Performed: CYSTOSCOPY WITH FULGERATION (Bladder)  Patient Location: PACU  Anesthesia Type:General  Level of Consciousness: awake and confused  Airway & Oxygen Therapy: Patient Spontanous Breathing and Patient connected to face mask  Post-op Assessment: Report given to RN, Post -op Vital signs reviewed and stable, and Patient moving all extremities X 4  Post vital signs: Reviewed and stable  Last Vitals:  Vitals Value Taken Time  BP 107/48 08/20/22 1546  Temp    Pulse    Resp 16 08/20/22 1553  SpO2    Vitals shown include unfiled device data.  Last Pain:  Vitals:   08/20/22 1351  TempSrc: (P) Oral  PainSc:          Complications: No notable events documented.

## 2022-08-20 NOTE — Telephone Encounter (Addendum)
Call from daughter reporting that she wants Dr Orlie Dakin to know that patient has been admitted to [facility]. He went in for weakness and they were trying to insert a foley and he started bleed and passing blood clots. He is going to be taken to surgery to remove the clots and repair a possible tear in his bladder

## 2022-08-20 NOTE — Progress Notes (Signed)
[facility]  PROGRESS NOTE  Dean [name]. ION:[id number] DOB: 1935-12-16 DOA: 08/19/2022 PCP: Donita Brooks, MD   Brief HPI:   87 year old male with medical history of multiple myeloma, BPH, pulmonary embolism on anticoagulation came to hospital with generalized weakness, reported hypoxemia with O2 sats in 80s on room air.  Patient was placed on 2 L of oxygen via nasal cannula. UA test not reported due to interference of urine pigments. In and out Cath attempted, met with resistance, coude finally placed- with drainage of 1 L urine, but patient has been oozing blood since placement of Coude. CTA chest negative for PE, shows small bilateral pleural effusions with partial lower lobe consolidations-atelectasis or pneumonia.  Extensive skeletal lytic lesions-slightly progressed from prior CT in January. IV Vanc and cefepime given, with CT chest findings, azithromycin was added. 3 L bolus given.   Assessment/Plan:   Acute hypoxemic respiratory failure -CT chest negative for PE -Suggest partial lower lobe consolidation, atelectasis or pneumonia -Patient started on ceftriaxone Zithromax for pneumonia  Atrial fibrillation with RVR -Went into A-fib with RVR this morning, started on Cardizem gtt. -Anticoagulation on hold due to hematuria  BPH/hematuria -Bladder outlet obstruction, in and out cath met resistance, subsequent coud placed with patient starting draining blood around the coud catheter -Urology was consulted -Plan for cystoscopy today for clot evacuation and fulguration  Anemia -Secondary to multiple myeloma -S/p 1 unit PRBC this morning -Also got 2 units PRBC in PACU -Follow hemoglobin in a.m.   Hyperkalemia -Potassium is 5.6 -Will give 1 dose of Lokelma 10 g p.o. -Follow serum potassium in a.m.  Multiple myeloma without remission -Follows Dr. Orlie Dakin -Currently on chemotherapy with Pomalyst; dose increased from 2 to 4 mg -Has been receiving radiation  treatment to right hip/pelvis  Pulmonary embolism -Eliquis on hold due to hematuria    Medications     Chlorhexidine Gluconate Cloth  6 each Topical Daily   pneumococcal 20-valent conjugate vaccine  0.5 mL Intramuscular Tomorrow-1000   tamsulosin  0.4 mg Oral QPC supper     Data Reviewed:   CBG:  Recent Labs  Lab 08/19/22 1117  GLUCAP 103*    SpO2: 100 % O2 Flow Rate (L/min): 2 L/min    Vitals:   08/20/22 1632 08/20/22 1645 08/20/22 1700 08/20/22 1715  BP: (!) 128/55 (!) 118/59 (!) 128/53   Pulse: 75 69 70 67  Resp: 20 (!) 22 14 14   Temp:      TempSrc:      SpO2: 100% 99% 100% 100%  Weight:      Height:          Data Reviewed:  Basic Metabolic Panel: Recent Labs  Lab 08/14/22 0905 08/19/22 1110 08/20/22 0514 08/20/22 1458  NA 135 136 139 141  K 3.3* 3.7 4.5 5.6*  CL 105 107 112* 112*  CO2 23 25 20*  --   GLUCOSE 128* 110* 113* 120*  BUN 26* 31* 39* 59*  CREATININE 0.60* 0.83 1.15 1.10  CALCIUM 7.9* 7.8* 7.0*  --     CBC: Recent Labs  Lab 08/14/22 0905 08/19/22 1100 08/19/22 1913 08/20/22 0514 08/20/22 1458  WBC 4.1 1.9* 4.6 4.1  --   NEUTROABS 2.7  --   --   --   --   HGB 11.1* 8.9* 8.3* 6.7* 6.8*  HCT 35.6* 28.2* 27.9* 22.6* 20.0*  MCV 98.1 98.3 101.5* 100.0  --   PLT 166 94* 97* 102*  --  LFT Recent Labs  Lab 08/14/22 0905 08/19/22 1110  AST 19 18  ALT 14 17  ALKPHOS 26* 29*  BILITOT 0.4 0.5  PROT 7.1 6.2*  ALBUMIN 2.5* 1.8*     Antibiotics: Anti-infectives (From admission, onward)    Start     Dose/Rate Route Frequency Ordered Stop   08/20/22 1700  azithromycin (ZITHROMAX) 500 mg in sodium chloride 0.9 % 250 mL IVPB        500 mg 250 mL/hr over 60 Minutes Intravenous Every 24 hours 08/19/22 2117 08/24/22 1659   08/20/22 1200  vancomycin (VANCOREADY) IVPB 1500 mg/300 mL  Status:  Discontinued       Placed in "Followed by" Linked Group   1,500 mg 150 mL/hr over 120 Minutes Intravenous Every 24 hours 08/19/22 1137  08/19/22 2127   08/19/22 2200  cefTRIAXone (ROCEPHIN) 2 g in sodium chloride 0.9 % 100 mL IVPB        2 g 200 mL/hr over 30 Minutes Intravenous Every 24 hours 08/19/22 2117 08/24/22 2159   08/19/22 1700  azithromycin (ZITHROMAX) 500 mg in sodium chloride 0.9 % 250 mL IVPB        500 mg 250 mL/hr over 60 Minutes Intravenous  Once 08/19/22 1649 08/19/22 2013   08/19/22 1200  vancomycin (VANCOREADY) IVPB 1750 mg/350 mL       Placed in "Followed by" Linked Group   1,750 mg 175 mL/hr over 120 Minutes Intravenous  Once 08/19/22 1137 08/19/22 1516   08/19/22 1130  ceFEPIme (MAXIPIME) 2 g in sodium chloride 0.9 % 100 mL IVPB        2 g 200 mL/hr over 30 Minutes Intravenous  Once 08/19/22 1117 08/19/22 1254        DVT prophylaxis: SCDs  Code Status: DNR  Family Communication:    CONSULTS    Subjective   Went into A-fib with RVR this morning, complained of palpitations and shortness of breath.  Objective    Physical Examination:   General-appears in no acute distress Heart-S1-S2, irregular Lungs-clear to auscultation bilaterally, no wheezing or crackles auscultated Abdomen-soft, nontender, no organomegaly Extremities-no edema in the lower extremities Neuro-alert, oriented x3, no focal deficit noted   Status is: Inpatient:             Meredeth Ide   [facility] If 7PM-7AM, please contact night-coverage at [email or web address], Office  [phone]   08/20/2022, 5:50 PM  LOS: 1 day

## 2022-08-20 NOTE — TOC Initial Note (Signed)
Transition of Care (TOC) - Initial/Assessment Note    Patient Details  Name: Dean [name]. MRN: [id number] Date of Birth: 1935/03/24  Transition of Care [facility]) CM/SW Contact:    Lanier Clam, RN Phone Number: 08/20/2022, 1:20 PM  Clinical Narrative:  Patient active w/[facility] for HHPT-rep [facility] aware.Await post surgery for recc. Continue to monitor for d/c needs.                 Expected Discharge Plan: Home w Home Health Services Barriers to Discharge: Continued Medical Work up   Patient Goals and CMS Choice Patient states their goals for this hospitalization and ongoing recovery are:: Case Manager CMS Medicare.gov Compare Post Acute Care list provided to:: Patient Represenative (must comment) Choice offered to / list presented to : Adult Children [place] ownership interest in [facility].provided to:: Adult Children    Expected Discharge Plan and Services   Discharge Planning Services: CM Consult Post Acute Care Choice: Home Health Living arrangements for the past 2 months: Single Family Home                                      Prior Living Arrangements/Services Living arrangements for the past 2 months: Single Family Home Lives with:: Adult Children Patient language and need for interpreter reviewed:: Yes Do you feel safe going back to the place where you live?: Yes      Need for Family Participation in Patient Care: Yes (Comment) Care giver support system in place?: Yes (comment)   Criminal Activity/Legal Involvement Pertinent to Current Situation/Hospitalization: No - Comment as needed  Activities of Daily Living Home Assistive Devices/Equipment: Eyeglasses, Grab bars around toilet, Grab bars in shower, Walker (specify type) ADL Screening (condition at time of admission) Patient's cognitive ability adequate to safely complete daily activities?: Yes Is the patient deaf or have difficulty hearing?: No Does the patient have difficulty  seeing, even when wearing glasses/contacts?: No Does the patient have difficulty concentrating, remembering, or making decisions?: No Patient able to express need for assistance with ADLs?: Yes Does the patient have difficulty dressing or bathing?: Yes Independently performs ADLs?: No Communication: Independent Dressing (OT): Needs assistance Is this a change from baseline?: Pre-admission baseline Grooming: Needs assistance Is this a change from baseline?: Pre-admission baseline Feeding: Independent Bathing: Needs assistance Is this a change from baseline?: Pre-admission baseline Toileting: Independent with device (comment) In/Out Bed: Independent with device (comment) (walker and grab bars per daughter) Walks in Home: Independent with device (comment) Does the patient have difficulty walking or climbing stairs?: Yes Weakness of Legs: None Weakness of Arms/Hands: None  Permission Sought/Granted Permission sought to share information with : Case Manager Permission granted to share information with : Yes, Verbal Permission Granted  Share Information with NAME: Case Manager           Emotional Assessment Appearance:: Appears stated age Attitude/Demeanor/Rapport: Gracious Affect (typically observed): Accepting Orientation: : Oriented to Self, Oriented to Place, Oriented to  Time, Oriented to Situation Alcohol / Substance Use: Not Applicable Psych Involvement: No (comment)  Admission diagnosis:  Generalized weakness [R53.1] PNA (pneumonia) [J18.9] Community acquired pneumonia, unspecified laterality [J18.9] Acute hypoxic respiratory failure (HCC) [J96.01] Patient Active Problem List   Diagnosis Date Noted   Acute hypoxic respiratory failure (HCC) 08/19/2022   PNA (pneumonia) 08/19/2022   Urethral bleeding 08/19/2022   Uncomplicated opioid dependence (HCC) 02/13/2022   SVT (supraventricular tachycardia)  02/12/2022   Pulmonary embolism (HCC) 02/10/2022   Acute metabolic  encephalopathy 02/10/2022   Spinal stenosis, lumbar 10/13/2016   Chronic back pain 10/01/2016   DDD (degenerative disc disease), lumbar 10/01/2016   OA (osteoarthritis) of knee 10/01/2016   Diverticulitis s/p robotic sigmoid colectomy 07/11/2015 07/11/2015   Goals of care, counseling/discussion    Abnormal CT scan, sigmoid colon 04/02/2015   Abnormal PET scan of colon 04/02/2015   Thrombocytopenia (HCC) 01/09/2015   Multiple myeloma without remission (HCC) 02/15/2014   Allergic rhinitis 01/16/2014   History of vertebral stress fracture 01/16/2014   Dermatitis 01/16/2014   Loss of weight 09/05/2013   Insomnia 09/05/2013   Fatigue 05/16/2013   Neck pain 12/07/2012   Skull lesion 11/29/2012   Neuropathy (HCC)    BPH (benign prostatic hyperplasia)    GERD (gastroesophageal reflux disease)    H/O ETOH abuse    PCP:  Donita Brooks, MD Pharmacy:   [facility] - [place], [place] - [address] [place] [address] Phone: [phone] Fax: [phone]  [facility], [place] - [address] [address] [place] [address] Phone: [phone] Fax: [phone]     Social Determinants of Health (SDOH) Social History: SDOH Screenings   Food Insecurity: No Food Insecurity (08/19/2022)  Housing: Low Risk  (08/19/2022)  Transportation Needs: No Transportation Needs (08/19/2022)  Utilities: Not At Risk (08/19/2022)  Alcohol Screen: Low Risk  (01/09/2022)  Depression (PHQ2-9): Low Risk  (08/13/2022)  Financial Resource Strain: Low Risk  (03/21/2022)  Physical Activity: Inactive (01/09/2022)  Social Connections: Moderately Isolated (01/09/2022)  Stress: Stress Concern Present (01/09/2022)  Tobacco Use: Low Risk  (08/19/2022)   SDOH Interventions:     Readmission Risk Interventions     No data to display

## 2022-08-20 NOTE — Anesthesia Postprocedure Evaluation (Signed)
Anesthesia Post Note  Patient: Marte Celani.  Procedure(s) Performed: CYSTOSCOPY WITH FULGERATION (Bladder)     Patient location during evaluation: PACU Anesthesia Type: General Level of consciousness: sedated, patient cooperative and oriented Pain management: pain level controlled Vital Signs Assessment: post-procedure vital signs reviewed and stable Respiratory status: spontaneous breathing, nonlabored ventilation, respiratory function stable and patient connected to nasal cannula oxygen Cardiovascular status: blood pressure returned to baseline and stable (pt much more stable, NSR, s/p transfusion) Postop Assessment: no apparent nausea or vomiting Anesthetic complications: no   No notable events documented.  Last Vitals:  Vitals:   08/20/22 1700 08/20/22 1715  BP: (!) 128/53   Pulse: 70 67  Resp: 14 14  Temp:    SpO2: 100% 100%    Last Pain:  Vitals:   08/20/22 1547  TempSrc:   PainSc: 0-No pain                 Marionna Gonia,E. Brason Berthelot

## 2022-08-20 NOTE — Consult Note (Signed)
Urology Consult Note   Requesting Attending Physician:  Onnie Boer, MD Service Providing Consult: Urology  Consulting Attending: Dr. Modena Slater   Reason for Consult: Hematuria clot retention  HPI: Dean [name]. is seen in consultation for reasons noted above at the request of Emokpae, Ejiroghene E, MD  for evaluation of hematuria with clot retention.  This is a 87 y.o. male with PE on Eliquis, multiple myeloma on chemotherapy and radiation, BPH, GERD who presented to an outside hospital with altered mental status and fatigue.  Patient was in urinary retention.  A catheter was attempted and patient had blood per meatus after multiple INO attempts.  They were able to get a coud catheter and which drained 1 L of urine however the catheter stopped draining and became acutely bloody.  Prior to this, the patient did not have any bloody urine.  He was urinating well on his own.  Patient currently unable to void right now.  He has never had any prostate surgeries.  Does have a history of BPH.  He is currently on Eliquis for a PE.    Past Medical History: Past Medical History:  Diagnosis Date   BPH (benign prostatic hyperplasia)    Colonic diverticular abscess 01/08/2015   Diverticulitis 12/22/2014   complicated by abscess and required percutaneous drainage   GERD (gastroesophageal reflux disease)    H/O ETOH abuse    History of chemotherapy last done jan 2017   History of radiation therapy 01/05/13-02/10/13   45 gray to left occipital condyle region   History of radiation therapy 12/01/16-12/10/16   Parasternal nodule, chest- 24 Gy total delivered in 8 fractions, Left sacro-iliac, pelvis- 24 Gy total delivered in 8 fractions    History of radiation therapy 02/19/17-02/22/17   right temporal scalp 30 Gy in 10 fractions   History of radiation therapy    Right hip( Pelvis) 11/18/21-11/21/21-Dr. Antony Blackbird   Intra-abdominal abscess (HCC)    Multiple myeloma (HCC) 2015    Neuropathy    Radiation 02/21/14-03/08/14   right posterior chest wall area 30 gray   Radiation 04/19/14-05/02/14   lumbar spine 25 gray   Skull lesion    Left occipital condyle   Wrist fracture, left    x 2    Past Surgical History:  Past Surgical History:  Procedure Laterality Date   COLONOSCOPY N/A 04/19/2015   Procedure: COLONOSCOPY;  Surgeon: West Bali, MD;  Location: [facility];  Service: Endoscopy;  Laterality: N/A;  1:30 PM   CYST EXCISION  1959   tail bone   IR IMAGING GUIDED PORT INSERTION  09/25/2017    Medication: Current Facility-Administered Medications  Medication Dose Route Frequency Provider Last Rate Last Admin   0.9 % NaCl with KCl 20 mEq/ L  infusion   Intravenous Continuous Emokpae, Ejiroghene E, MD       acetaminophen (TYLENOL) tablet 650 mg  650 mg Oral Q6H PRN Emokpae, Ejiroghene E, MD       Or   acetaminophen (TYLENOL) suppository 650 mg  650 mg Rectal Q6H PRN Emokpae, Ejiroghene E, MD       azithromycin (ZITHROMAX) 500 mg in sodium chloride 0.9 % 250 mL IVPB  500 mg Intravenous Q24H Emokpae, Ejiroghene E, MD       cefTRIAXone (ROCEPHIN) 2 g in sodium chloride 0.9 % 100 mL IVPB  2 g Intravenous Q24H Emokpae, Ejiroghene E, MD       guaiFENesin-dextromethorphan (ROBITUSSIN DM) 100-10 MG/5ML syrup 15  mL  15 mL Oral Q8H PRN Emokpae, Ejiroghene E, MD       ondansetron (ZOFRAN) tablet 4 mg  4 mg Oral Q6H PRN Emokpae, Ejiroghene E, MD       Or   ondansetron (ZOFRAN) injection 4 mg  4 mg Intravenous Q6H PRN Emokpae, Ejiroghene E, MD       oxyCODONE (Oxy IR/ROXICODONE) immediate release tablet 10 mg  10 mg Oral TID PRN Emokpae, Ejiroghene E, MD       pneumococcal 20-valent conjugate vaccine (PREVNAR 20) injection 0.5 mL  0.5 mL Intramuscular Tomorrow-1000 Emokpae, Ejiroghene E, MD       polyethylene glycol (MIRALAX / GLYCOLAX) packet 17 g  17 g Oral Daily PRN Emokpae, Ejiroghene E, MD       tamsulosin (FLOMAX) capsule 0.4 mg  0.4 mg Oral QPC supper Emokpae,  Ejiroghene E, MD       Facility-Administered Medications Ordered in Other Encounters  Medication Dose Route Frequency Provider Last Rate Last Admin   heparin lock flush 100 unit/mL  500 Units Intravenous Once Jeralyn Ruths, MD        Allergies: Allergies  Allergen Reactions   Codeine Other (See Comments)    Headache   Morphine And Codeine Other (See Comments)    Makes him feel weird   Revlimid [Lenalidomide] Other (See Comments)    "Causes me to become weak"    Social History: Social History   Tobacco Use   Smoking status: Never   Smokeless tobacco: Never  Vaping Use   Vaping status: Never Used  Substance Use Topics   Alcohol use: No    Comment: " Not much no more"   Drug use: No    Family History Family History  Problem Relation Age of Onset   Diabetes Mother    Stroke Mother    Hypertension Father     Review of Systems 10 systems were reviewed and are negative except as noted specifically in the HPI.  Objective   Vital signs in last 24 hours: BP (!) 129/50 (BP Location: Left Arm)   Pulse 70   Temp (!) 97.4 F (36.3 C)   Resp 16   Ht 5\' 8"  (1.727 m)   Wt 87 kg   SpO2 98%   BMI 29.16 kg/m   Physical Exam General: Mild distress HEENT: [place]/AT, EOMI, MMM Pulmonary: Normal work of breathing Cardiovascular: HDS, adequate peripheral perfusion Abdomen: Soft, abdominal hernia that is hard to palpation however the rest the belly is soft. GU: Bladder initially palpable prior to placing Foley catheter however bladder became nonpalpable after catheter placement in the suprapubic region with soft and nontender to palpation. Extremities: warm and well perfused Neuro: Appropriate, no focal neurological deficits  Most Recent Labs: Lab Results  Component Value Date   WBC 4.6 08/19/2022   HGB 8.3 (L) 08/19/2022   HCT 27.9 (L) 08/19/2022   PLT 97 (L) 08/19/2022    Lab Results  Component Value Date   NA 136 08/19/2022   K 3.7 08/19/2022   CL 107  08/19/2022   CO2 25 08/19/2022   BUN 31 (H) 08/19/2022   CREATININE 0.83 08/19/2022   CALCIUM 7.8 (L) 08/19/2022   MG 2.1 03/20/2022    Lab Results  Component Value Date   INR 1.5 (H) 08/19/2022   APTT 31 08/19/2022     Urine Culture: @[id number] (laburin,org,[id number],[id number])@   IMAGING: CT Angio Chest PE W and/or Wo Contrast  Result Date: 08/19/2022 CLINICAL DATA:  Weakness  hypoxia EXAM: CT ANGIOGRAPHY CHEST WITH CONTRAST TECHNIQUE: Multidetector CT imaging of the chest was performed using the standard protocol during bolus administration of intravenous contrast. Multiplanar CT image reconstructions and MIPs were obtained to evaluate the vascular anatomy. RADIATION DOSE REDUCTION: This exam was performed according to the departmental dose-optimization program which includes automated exposure control, adjustment of the mA and/or kV according to patient size and/or use of iterative reconstruction technique. CONTRAST:  75mL OMNIPAQUE IOHEXOL 350 MG/ML SOLN COMPARISON:  Chest x-ray 08/19/2022, CT chest 02/10/2022 FINDINGS: Cardiovascular: Satisfactory opacification of the pulmonary arteries to the segmental level. No evidence of pulmonary embolism. Nondiagnostic for assessment of bilateral lower lobe distal segmental and subsegmental PE due to motion and inadequate opacification. Cardiomegaly. Moderate aortic atherosclerosis. No aneurysm. Coronary vascular calcification. Mitral calcification. No pericardial effusion Mediastinum/Nodes: Midline trachea. 1.4 cm left thyroid nodule, no imaging follow-up is recommended. Subcentimeter right paratracheal nodes. Esophagus within normal limits Lungs/Pleura: Small bilateral pleural effusions. Similar lower lobe parenchymal calcifications. Partial lower lobe consolidations. Bandlike scarring in the right upper lobe with mild bronchiectasis. Upper Abdomen: No acute finding Musculoskeletal: Extensive skeletal metastatic disease with numerous lytic lesions involving  the sternum, ribs, spine, and shoulders, appears slightly progressive as compared with January CT. Multiple old appearing bilateral rib fractures and old left clavicle fracture. Review of the MIP images confirms the above findings. IMPRESSION: 1. Negative for acute pulmonary embolus. Nondiagnostic for assessment of bilateral lower lobe distal segmental and subsegmental PE due to motion and inadequate opacification. 2. Small bilateral pleural effusions with partial lower lobe consolidations, atelectasis versus pneumonia. Bandlike scarring and mild bronchiectasis in the right upper lobe. 3. Extensive skeletal lytic disease corresponding to history of myeloma, appears slightly progressive as compared with January CT. 4. Aortic atherosclerosis. Aortic Atherosclerosis (ICD10-I70.0). Electronically Signed   By: Jasmine Pang M.D.   On: 08/19/2022 16:41   DG Chest Port 1 View  Result Date: 08/19/2022 CLINICAL DATA:  Shortness of breath, hypoxia. EXAM: PORTABLE CHEST 1 VIEW COMPARISON:  February 10, 2022. FINDINGS: Stable cardiomediastinal silhouette. Right internal jugular Port-A-Cath is unchanged in position. There are again noted multiple bilateral rib lytic lesions consistent with metastatic disease. Minimal bibasilar subsegmental atelectasis is again noted. IMPRESSION: Multiple bilateral rib lytic lesions are again noted consistent with metastatic disease. Bibasilar subsegmental atelectasis is noted. Electronically Signed   By: Lupita Raider M.D.   On: 08/19/2022 12:14    ------  Assessment:  87 y.o. male with PE on Eliquis, GERD, multiple myeloma with chemo and radiation who presented with hematuria and clot retention after multiple Foley catheter attempts.  Foley catheter required to be placed over a guidewire at the bedside.  Cystoscopy prior to guidewire placement of catheter revealed a significantly bleeding urethra.  Unable to fully visualize true lumen.  Was able to place a 22 [place] three-way catheter  over wire.  Hemoglobin was 8.6.  Creatinine normal at 0.83 which is in his baseline.   Recommendations: #Hematuria and clot retention -Continue 22 French three-way catheter to drainage.  Continue CBI on a fast drip.  If catheter clots off or urine becomes cherry red in color, please page urology. -Will reassess urine in the morning -Plan to obtain a CTAP to evaluate for clot burden in the bladder as well as any free fluid in the abdomen -Please continue to hold Eliquis if medically able -Trend hemoglobin -NPO at MN incase of intervention.   # Urinary retention -Given patient had a liter of urine in his bladder  prior to placing the outside coud catheter, recommend continue Foley catheter for at least 1 week  -Rest of care per primary team   Thank you for this consult. Please contact the urology consult pager with any further questions/concerns.

## 2022-08-20 NOTE — Op Note (Signed)
Operative Note  Preoperative diagnosis:  1.  Gross hematuria 2.  Urinary retention secondary to blood clot  Postoperative diagnosis: 1.  Gross hematuria secondary to prostatic bleeding and likely traumatic Foley 2.  Urinary retention secondary to blood clot  Procedure(s): 1.  Cystoscopy with clot evacuation and fulguration  Surgeon: Modena Slater, MD  Assistants: None  Anesthesia: General  Complications: None immediate  EBL: 100 cc  Specimens: 1.  None  Drains/Catheters: 1.  24 French three-way hematuria catheter  Intraoperative findings: 1.  Anterior urethra with some edema and irritation but was wide open. 2.  Patient had very friable prostate with bilobar hypertrophy and a large median lobe.  Towards the right it appeared that he had potential false passage.  This area was fulgurated.  There was bleeding throughout the prostatic mucosa and at the bladder neck that was fulgurated. 3.  Very large amount of clot burden all of which was evacuated.  There was no evidence of any bladder perforation.  There was some bladder edema and a couple spots of some mild bleeding from irritation that were fulgurated.  Bilateral ureteral orifices were normal in position and away from any areas of fulguration.  Indication: 87 year old male admitted with pneumonia had some urinary retention and was catheterized.  There was traumatic catheterization and he started to have a lot of bleeding.  He was transferred to [facility] for further intervention.  Ultimately three-way catheter was placed and CBI initiated but this has been having issues with getting clogged and CT scan showed a large amount of clot burden.  Description of procedure:  The patient was identified and consent was obtained.  The patient was taken to the operating room and placed in the supine position.  The patient was placed under general anesthesia.  Perioperative antibiotics were administered.  The patient was placed in dorsal  lithotomy.  Patient was prepped and draped in a standard sterile fashion and a timeout was performed.  A 26 French resectoscope with a visual obturator in place was advanced into the urethra and into the bladder.  Large amount of clot burden was evacuated.  Probably about a liter of clot.  I then inspected the bladder mucosa.  All clot was evacuated.  There were couple areas of erythema and edema with some mild oozing of blood that was fulgurated.  There was more bleeding at the bladder neck and prostatic mucosa that was fulgurated.  There was also an area that appeared to be a false passage towards the right in the prostatic urethra that had some active bleeding that was fulgurated.  All active bleeding was treated.  Once there was no active bleeding noted, I withdrew the scope and placed a 24 [place] three-way hematuria catheter.  I instilled 30 cc into the catheter balloon.  CBI was initiated.  This concluded the operation.  Patient tolerated the procedure well was stable postoperatively.  Plan: Patient will go back to the floor and wean CBI.  He will continue to be treated for his other medical comorbidities.

## 2022-08-20 NOTE — Anesthesia Preprocedure Evaluation (Signed)
Anesthesia Evaluation  Patient identified by MRN, date of birth, ID band Patient awake    Reviewed: Allergy & Precautions, H&P , NPO status , Patient's Chart, lab work & pertinent test results  Airway Mallampati: II  TM Distance: >3 FB Neck ROM: Full    Dental no notable dental hx.    Pulmonary neg pulmonary ROS   Pulmonary exam normal breath sounds clear to auscultation       Cardiovascular negative cardio ROS Normal cardiovascular exam Rhythm:Regular Rate:Normal     Neuro/Psych negative neurological ROS  negative psych ROS   GI/Hepatic Neg liver ROS,GERD  ,,  Endo/Other  negative endocrine ROS    Renal/GU negative Renal ROS  negative genitourinary   Musculoskeletal  (+) Arthritis , Osteoarthritis,    Abdominal   Peds negative pediatric ROS (+)  Hematology negative hematology ROS (+)   Anesthesia Other Findings   Reproductive/Obstetrics negative OB ROS                             Anesthesia Physical Anesthesia Plan  ASA: 3 and emergent  Anesthesia Plan: General   Post-op Pain Management: Minimal or no pain anticipated   Induction: Intravenous  PONV Risk Score and Plan: 2 and Ondansetron, Midazolam and Treatment may vary due to age or medical condition  Airway Management Planned: LMA  Additional Equipment:   Intra-op Plan:   Post-operative Plan: Extubation in OR  Informed Consent:   Plan Discussed with:   Anesthesia Plan Comments:        Anesthesia Quick Evaluation

## 2022-08-20 NOTE — Plan of Care (Signed)
  Problem: Activity: Goal: Risk for activity intolerance will decrease Outcome: Progressing   Problem: Elimination: Goal: Will not experience complications related to bowel motility Outcome: Progressing   Problem: Pain Managment: Goal: General experience of comfort will improve Outcome: Progressing   Problem: Skin Integrity: Goal: Risk for impaired skin integrity will decrease Outcome: Progressing   Problem: Respiratory: Goal: Ability to maintain adequate ventilation will improve Outcome: Progressing

## 2022-08-20 NOTE — Anesthesia Procedure Notes (Signed)
Procedure Name: Intubation Date/Time: 08/20/2022 2:21 PM  Performed by: Shanon Payor, CRNAPre-anesthesia Checklist: Patient identified, Emergency Drugs available, Suction available, Patient being monitored and Timeout performed Patient Re-evaluated:Patient Re-evaluated prior to induction Oxygen Delivery Method: Circle system utilized Preoxygenation: Pre-oxygenation with 100% oxygen Induction Type: IV induction and Rapid sequence Laryngoscope Size: Mac and 4 Grade View: Grade I Tube type: Oral Tube size: 7.5 mm Number of attempts: 1 Airway Equipment and Method: Stylet Placement Confirmation: ETT inserted through vocal cords under direct vision, positive ETCO2, CO2 detector and breath sounds checked- equal and bilateral Secured at: 23 cm Tube secured with: Tape Dental Injury: Teeth and Oropharynx as per pre-operative assessment

## 2022-08-21 ENCOUNTER — Encounter (HOSPITAL_COMMUNITY): Payer: Self-pay | Admitting: Urology

## 2022-08-21 ENCOUNTER — Ambulatory Visit: Payer: Medicare Other

## 2022-08-21 ENCOUNTER — Encounter (HOSPITAL_COMMUNITY): Payer: Self-pay | Admitting: Oncology

## 2022-08-21 DIAGNOSIS — J9601 Acute respiratory failure with hypoxia: Secondary | ICD-10-CM | POA: Diagnosis not present

## 2022-08-21 DIAGNOSIS — N401 Enlarged prostate with lower urinary tract symptoms: Secondary | ICD-10-CM | POA: Diagnosis not present

## 2022-08-21 DIAGNOSIS — N368 Other specified disorders of urethra: Secondary | ICD-10-CM | POA: Diagnosis not present

## 2022-08-21 DIAGNOSIS — C9000 Multiple myeloma not having achieved remission: Secondary | ICD-10-CM | POA: Diagnosis not present

## 2022-08-21 DIAGNOSIS — I2699 Other pulmonary embolism without acute cor pulmonale: Secondary | ICD-10-CM

## 2022-08-21 LAB — COMPREHENSIVE METABOLIC PANEL
ALT: 13 U/L (ref 0–44)
AST: 14 U/L — ABNORMAL LOW (ref 15–41)
Albumin: <1.5 g/dL — ABNORMAL LOW (ref 3.5–5.0)
Alkaline Phosphatase: 19 U/L — ABNORMAL LOW (ref 38–126)
Anion gap: 5 (ref 5–15)
BUN: 40 mg/dL — ABNORMAL HIGH (ref 8–23)
CO2: 21 mmol/L — ABNORMAL LOW (ref 22–32)
Calcium: 6.4 mg/dL — CL (ref 8.9–10.3)
Chloride: 113 mmol/L — ABNORMAL HIGH (ref 98–111)
Creatinine, Ser: 0.87 mg/dL (ref 0.61–1.24)
GFR, Estimated: >60 mL/min (ref >60)
Glucose, Bld: 137 mg/dL — ABNORMAL HIGH (ref 70–99)
Potassium: 3.2 mmol/L — ABNORMAL LOW (ref 3.5–5.1)
Sodium: 139 mmol/L (ref 135–145)
Total Bilirubin: 0.4 mg/dL (ref 0.3–1.2)
Total Protein: 4.7 g/dL — ABNORMAL LOW (ref 6.5–8.1)

## 2022-08-21 LAB — BPAM RBC
Blood Product Expiration Date: 202408022359
Blood Product Expiration Date: 202408072359
Blood Product Expiration Date: 202408072359
ISSUE DATE / TIME: 202407171041
ISSUE DATE / TIME: 202407171510
ISSUE DATE / TIME: 202407171604
Unit Type and Rh: 1700
Unit Type and Rh: 7300
Unit Type and Rh: 7300

## 2022-08-21 LAB — CBC
HCT: 24.6 % — ABNORMAL LOW (ref 39.0–52.0)
Hemoglobin: 8.1 g/dL — ABNORMAL LOW (ref 13.0–17.0)
MCH: 29.8 pg (ref 26.0–34.0)
MCHC: 32.9 g/dL (ref 30.0–36.0)
MCV: 90.4 fL (ref 80.0–100.0)
Platelets: 110 10*3/uL — ABNORMAL LOW (ref 150–400)
RBC: 2.72 MIL/uL — ABNORMAL LOW (ref 4.22–5.81)
RDW: 19.4 % — ABNORMAL HIGH (ref 11.5–15.5)
WBC: 5.1 10*3/uL (ref 4.0–10.5)
nRBC: 0 % (ref 0.0–0.2)

## 2022-08-21 LAB — HEMOGLOBIN AND HEMATOCRIT, BLOOD
HCT: 24.8 % — ABNORMAL LOW (ref 39.0–52.0)
Hemoglobin: 8.2 g/dL — ABNORMAL LOW (ref 13.0–17.0)

## 2022-08-21 LAB — TYPE AND SCREEN
ABO/RH(D): B POS
Antibody Screen: NEGATIVE
Unit division: 0
Unit division: 0
Unit division: 0

## 2022-08-21 MED ORDER — POTASSIUM CHLORIDE 20 MEQ PO PACK
40.0000 meq | PACK | Freq: Once | ORAL | Status: AC
  Administered 2022-08-21: 40 meq via ORAL
  Filled 2022-08-21: qty 2

## 2022-08-21 NOTE — Consult Note (Addendum)
Urology Consult Note   Requesting Attending Physician:  Meredeth Ide, MD Service Providing Consult: Urology  Consulting Attending: Dr. Modena Slater   Reason for Consult: Hematuria clot retention  HPI: Dean [name]. is seen in consultation for reasons noted above at the request of Meredeth Ide, MD  for evaluation of hematuria with clot retention.  This is a 87 y.o. male with PE on Eliquis, multiple myeloma on chemotherapy and radiation, BPH, GERD who presented to an outside hospital with altered mental status and fatigue.  Patient was in urinary retention.  A catheter was attempted and patient had blood per meatus after multiple INO attempts.  They were able to get a coud catheter and which drained 1 L of urine however the catheter stopped draining and became acutely bloody.  Prior to this, the patient did not have any bloody urine.  He was urinating well on his own.  Patient currently unable to void right now.  He has never had any prostate surgeries.  Does have a history of BPH.  He is currently on Eliquis for a PE.    Past Medical History: Past Medical History:  Diagnosis Date   BPH (benign prostatic hyperplasia)    Colonic diverticular abscess 01/08/2015   Diverticulitis 12/22/2014   complicated by abscess and required percutaneous drainage   GERD (gastroesophageal reflux disease)    H/O ETOH abuse    History of chemotherapy last done jan 2017   History of radiation therapy 01/05/13-02/10/13   45 gray to left occipital condyle region   History of radiation therapy 12/01/16-12/10/16   Parasternal nodule, chest- 24 Gy total delivered in 8 fractions, Left sacro-iliac, pelvis- 24 Gy total delivered in 8 fractions    History of radiation therapy 02/19/17-02/22/17   right temporal scalp 30 Gy in 10 fractions   History of radiation therapy    Right hip( Pelvis) 11/18/21-11/21/21-Dr. Antony Blackbird   Intra-abdominal abscess (HCC)    Multiple myeloma (HCC) 2015   Neuropathy     Radiation 02/21/14-03/08/14   right posterior chest wall area 30 gray   Radiation 04/19/14-05/02/14   lumbar spine 25 gray   Skull lesion    Left occipital condyle   Wrist fracture, left    x 2    Past Surgical History:  Past Surgical History:  Procedure Laterality Date   COLONOSCOPY N/A 04/19/2015   Procedure: COLONOSCOPY;  Surgeon: West Bali, MD;  Location: [facility];  Service: Endoscopy;  Laterality: N/A;  1:30 PM   CYST EXCISION  1959   tail bone   CYSTOSCOPY WITH FULGERATION N/A 08/20/2022   Procedure: CYSTOSCOPY WITH FULGERATION;  Surgeon: Crista Elliot, MD;  Location: [facility];  Service: Urology;  Laterality: N/A;  cysto clot evac   IR IMAGING GUIDED PORT INSERTION  09/25/2017    Medication: Current Facility-Administered Medications  Medication Dose Route Frequency Provider Last Rate Last Admin   0.9 %  sodium chloride infusion  10 mL/hr Intravenous Once Shanon Payor, CRNA       acetaminophen (TYLENOL) tablet 650 mg  650 mg Oral Q6H PRN Emokpae, Ejiroghene E, MD       Or   acetaminophen (TYLENOL) suppository 650 mg  650 mg Rectal Q6H PRN Emokpae, Ejiroghene E, MD       azithromycin (ZITHROMAX) 500 mg in sodium chloride 0.9 % 250 mL IVPB  500 mg Intravenous Q24H Emokpae, Ejiroghene E, MD       cefTRIAXone (ROCEPHIN) 2  g in sodium chloride 0.9 % 100 mL IVPB  2 g Intravenous Q24H Emokpae, Ejiroghene E, MD 200 mL/hr at 08/20/22 2146 2 g at 08/20/22 2146   Chlorhexidine Gluconate Cloth 2 % PADS 6 each  6 each Topical Daily Meredeth Ide, MD   6 each at 08/20/22 1812   guaiFENesin-dextromethorphan (ROBITUSSIN DM) 100-10 MG/5ML syrup 15 mL  15 mL Oral Q8H PRN Emokpae, Ejiroghene E, MD       ondansetron (ZOFRAN) tablet 4 mg  4 mg Oral Q6H PRN Emokpae, Ejiroghene E, MD       Or   ondansetron (ZOFRAN) injection 4 mg  4 mg Intravenous Q6H PRN Emokpae, Ejiroghene E, MD       Oral care mouth rinse  15 mL Mouth Rinse PRN Emokpae, Ejiroghene E, MD       oxyCODONE (Oxy  IR/ROXICODONE) immediate release tablet 10 mg  10 mg Oral TID PRN Emokpae, Ejiroghene E, MD       pneumococcal 20-valent conjugate vaccine (PREVNAR 20) injection 0.5 mL  0.5 mL Intramuscular Tomorrow-1000 Emokpae, Ejiroghene E, MD       polyethylene glycol (MIRALAX / GLYCOLAX) packet 17 g  17 g Oral Daily PRN Emokpae, Ejiroghene E, MD       sodium chloride flush (NS) 0.9 % injection 10-40 mL  10-40 mL Intracatheter PRN Sharl Ma, Sarina Ill, MD       tamsulosin (FLOMAX) capsule 0.4 mg  0.4 mg Oral QPC supper Emokpae, Ejiroghene E, MD   0.4 mg at 08/20/22 2049   Facility-Administered Medications Ordered in Other Encounters  Medication Dose Route Frequency Provider Last Rate Last Admin   heparin lock flush 100 unit/mL  500 Units Intravenous Once Jeralyn Ruths, MD        Allergies: Allergies  Allergen Reactions   Codeine Other (See Comments)    Headache   Morphine And Codeine Other (See Comments)    Makes him feel weird   Revlimid [Lenalidomide] Other (See Comments)    "Causes me to become weak"    Social History: Social History   Tobacco Use   Smoking status: Never   Smokeless tobacco: Never  Vaping Use   Vaping status: Never Used  Substance Use Topics   Alcohol use: No    Comment: " Not much no more"   Drug use: No    Family History Family History  Problem Relation Age of Onset   Diabetes Mother    Stroke Mother    Hypertension Father     Review of Systems 10 systems were reviewed and are negative except as noted specifically in the HPI.  Objective   Vital signs in last 24 hours: BP 110/71 (BP Location: Right Arm)   Pulse 77   Temp 97.8 F (36.6 C) (Oral)   Resp 20   Ht 5\' 8"  (1.727 m)   Wt 87 kg   SpO2 98%   BMI 29.16 kg/m   Physical Exam General: Mild distress HEENT: [place]/AT, EOMI, MMM Pulmonary: Normal work of breathing Cardiovascular: HDS, adequate peripheral perfusion Abdomen: Soft, abdominal hernia that is hard to palpation however the rest the belly  is soft. GU: Foley catheter in place with clear yellow urine on slow drip CBI.  Extremities: warm and well perfused Neuro: Appropriate, no focal neurological deficits  Most Recent Labs: Lab Results  Component Value Date   WBC 5.1 08/21/2022   HGB 8.1 (L) 08/21/2022   HGB 8.2 (L) 08/21/2022   HCT 24.6 (L) 08/21/2022  HCT 24.8 (L) 08/21/2022   PLT 110 (L) 08/21/2022    Lab Results  Component Value Date   NA 139 08/21/2022   K 3.2 (L) 08/21/2022   CL 113 (H) 08/21/2022   CO2 21 (L) 08/21/2022   BUN 40 (H) 08/21/2022   CREATININE 0.87 08/21/2022   CALCIUM 6.4 (LL) 08/21/2022   MG 2.1 03/20/2022    Lab Results  Component Value Date   INR 1.5 (H) 08/19/2022   APTT 31 08/19/2022     Urine Culture: @[id number] (laburin,org,[id number])@   IMAGING: CT ABDOMEN PELVIS WO CONTRAST  Result Date: 08/20/2022 CLINICAL DATA:  Hematuria, assess for clot burden history of multiple myeloma * Tracking Code: BO * EXAM: CT ABDOMEN AND PELVIS WITHOUT CONTRAST TECHNIQUE: Multidetector CT imaging of the abdomen and pelvis was performed following the standard protocol without IV contrast. RADIATION DOSE REDUCTION: This exam was performed according to the departmental dose-optimization program which includes automated exposure control, adjustment of the mA and/or kV according to patient size and/or use of iterative reconstruction technique. COMPARISON:  06/04/2015 FINDINGS: Lower chest: Small bilateral pleural effusions, with calcified bilateral atelectasis or consolidation, most likely chronic. Extensive three-vessel coronary artery calcifications and or stents. Aortic valve calcifications. Cardiomegaly. Hepatobiliary: No focal liver abnormality is seen. Status post cholecystectomy. No biliary dilatation. Pancreas: Unremarkable. No pancreatic ductal dilatation or surrounding inflammatory changes. Spleen: Normal in size without significant abnormality. Adrenals/Urinary Tract: Adrenal glands are  unremarkable. Excreted contrast in the renal collecting systems, which limits assessment for urinary tract calculi. At least 1 small calculus of the inferior pole of the left kidney. Simple, benign renal cortical cysts, for which no further follow-up or characterization is required. No hydronephrosis. Urinary bladder is distended by heterogeneous clot about a Foley catheter, dimensions 11.6 x 10.5 x 9.9 cm (series 2, image 73, series 6, image 82). Stomach/Bowel: Stomach is within normal limits. Appendix appears normal. No evidence of bowel wall thickening, distention, or inflammatory changes. Descending and sigmoid diverticulosis. Vascular/Lymphatic: Aortic atherosclerosis and vascular calcinosis. No enlarged abdominal or pelvic lymph nodes. Reproductive: Prostatomegaly. Other: No abdominal wall hernia or abnormality. No ascites. Musculoskeletal: No acute osseous findings. Widespread mixed lytic and sclerotic osseous metastatic disease throughout the included skeleton, particularly notable for multiple sizable lytic lesions with soft tissue components involving the bony pelvis (series 2, image 60). IMPRESSION: 1. Urinary bladder is distended by heterogeneous clot about a Foley catheter, dimensions 11.6 x 10.5 x 9.9 cm. 2. Excreted contrast in the renal collecting systems, which limits assessment for urinary tract calculi. At least 1 small calculus of the inferior pole of the left kidney. No hydronephrosis. 3. Prostatomegaly. 4. Widespread mixed lytic and sclerotic osseous metastatic disease throughout the included skeleton, particularly notable for multiple sizable lytic lesions with soft tissue components involving the bony pelvis, in keeping with history of multiple myeloma. 5. Small bilateral pleural effusions, with calcified bilateral atelectasis or consolidation, most likely chronic. 6. Coronary artery disease. Aortic Atherosclerosis (ICD10-I70.0). Electronically Signed   By: Jearld Lesch M.D.   On: 08/20/2022  12:54   CT Angio Chest PE W and/or Wo Contrast  Result Date: 08/19/2022 CLINICAL DATA:  Weakness hypoxia EXAM: CT ANGIOGRAPHY CHEST WITH CONTRAST TECHNIQUE: Multidetector CT imaging of the chest was performed using the standard protocol during bolus administration of intravenous contrast. Multiplanar CT image reconstructions and MIPs were obtained to evaluate the vascular anatomy. RADIATION DOSE REDUCTION: This exam was performed according to the departmental dose-optimization program which includes automated exposure control, adjustment of the  mA and/or kV according to patient size and/or use of iterative reconstruction technique. CONTRAST:  75mL OMNIPAQUE IOHEXOL 350 MG/ML SOLN COMPARISON:  Chest x-ray 08/19/2022, CT chest 02/10/2022 FINDINGS: Cardiovascular: Satisfactory opacification of the pulmonary arteries to the segmental level. No evidence of pulmonary embolism. Nondiagnostic for assessment of bilateral lower lobe distal segmental and subsegmental PE due to motion and inadequate opacification. Cardiomegaly. Moderate aortic atherosclerosis. No aneurysm. Coronary vascular calcification. Mitral calcification. No pericardial effusion Mediastinum/Nodes: Midline trachea. 1.4 cm left thyroid nodule, no imaging follow-up is recommended. Subcentimeter right paratracheal nodes. Esophagus within normal limits Lungs/Pleura: Small bilateral pleural effusions. Similar lower lobe parenchymal calcifications. Partial lower lobe consolidations. Bandlike scarring in the right upper lobe with mild bronchiectasis. Upper Abdomen: No acute finding Musculoskeletal: Extensive skeletal metastatic disease with numerous lytic lesions involving the sternum, ribs, spine, and shoulders, appears slightly progressive as compared with January CT. Multiple old appearing bilateral rib fractures and old left clavicle fracture. Review of the MIP images confirms the above findings. IMPRESSION: 1. Negative for acute pulmonary embolus.  Nondiagnostic for assessment of bilateral lower lobe distal segmental and subsegmental PE due to motion and inadequate opacification. 2. Small bilateral pleural effusions with partial lower lobe consolidations, atelectasis versus pneumonia. Bandlike scarring and mild bronchiectasis in the right upper lobe. 3. Extensive skeletal lytic disease corresponding to history of myeloma, appears slightly progressive as compared with January CT. 4. Aortic atherosclerosis. Aortic Atherosclerosis (ICD10-I70.0). Electronically Signed   By: Jasmine Pang M.D.   On: 08/19/2022 16:41   DG Chest Port 1 View  Result Date: 08/19/2022 CLINICAL DATA:  Shortness of breath, hypoxia. EXAM: PORTABLE CHEST 1 VIEW COMPARISON:  February 10, 2022. FINDINGS: Stable cardiomediastinal silhouette. Right internal jugular Port-A-Cath is unchanged in position. There are again noted multiple bilateral rib lytic lesions consistent with metastatic disease. Minimal bibasilar subsegmental atelectasis is again noted. IMPRESSION: Multiple bilateral rib lytic lesions are again noted consistent with metastatic disease. Bibasilar subsegmental atelectasis is noted. Electronically Signed   By: Lupita Raider M.D.   On: 08/19/2022 12:14    ------  Assessment:  87 y.o. male with PE on Eliquis, GERD, multiple myeloma with chemo and radiation who presented with hematuria and clot retention after multiple Foley catheter attempts.  Pt is now s/p cystoscopy with clot evac and fulguration where there was a false passage and irritation to the prostate with significant clot burden. He required 3 unit of blood yesterday. Hg 8.1 today.  Recommendations:  #Hematuria and clot retention s/p cysto, clot evac and fulguration -Continue 22 French three-way catheter to drainage.  CBI paused this morning. We will reassess urine later this morning to see if stable off CBI. If urine stable, ok to discharge with catheter in place when primary team feels he is ok for DC.   -Please continue to hold Eliquis if medically able -Trend hemoglobin   # Urinary retention -Given patient had a liter of urine in his bladder prior to placing the outside coud catheter, recommend continue Foley catheter for at least 1 week  -Rest of care per primary team   Thank you for this consult. Please contact the urology consult pager with any further questions/concerns.

## 2022-08-21 NOTE — Progress Notes (Signed)
[facility] Liaison Note  This patient is currently enrolled in [employer], [employer] outpatient based palliative care program.  [facility] Liaison Team will follow for discharge disposition.  Please call with any outpatient based palliative care questions or concerns.  Thank you,  Haynes Bast, BSN, RN [facility] Liaison [phone]

## 2022-08-21 NOTE — Progress Notes (Signed)
[facility]  PROGRESS NOTE  Dean [name]. GMW:[id number] DOB: 1935/02/15 DOA: 08/19/2022 PCP: Donita Brooks, MD   Brief HPI:   87 year old male with medical history of multiple myeloma, BPH, pulmonary embolism on anticoagulation came to hospital with generalized weakness, reported hypoxemia with O2 sats in 80s on room air.  Patient was placed on 2 L of oxygen via nasal cannula. UA test not reported due to interference of urine pigments. In and out Cath attempted, met with resistance, coude finally placed- with drainage of 1 L urine, but patient has been oozing blood since placement of Coude. CTA chest negative for PE, shows small bilateral pleural effusions with partial lower lobe consolidations-atelectasis or pneumonia.  Extensive skeletal lytic lesions-slightly progressed from prior CT in January. IV Vanc and cefepime given, with CT chest findings, azithromycin was added. 3 L bolus given.   Assessment/Plan:   Acute hypoxemic respiratory failure -Resolved, no longer requiring oxygen -CT chest negative for PE -Suggest partial lower lobe consolidation, atelectasis or pneumonia -Patient started on ceftriaxone Zithromax for pneumonia  Atrial fibrillation with RVR -Resolved, back to sinus rhythm -Went into A-fib with RVR this morning, started on Cardizem gtt. -Anticoagulation on hold due to hematuria  BPH/hematuria -Resolved -Bladder outlet obstruction, in and out cath met resistance, subsequent coud placed with patient starting draining blood around the coud catheter -Urology was consulted -Underwent cystoscopy yesterday for clot evacuation and fulguration   Anemia -Secondary to multiple myeloma -S/p 3 units PRBC -Hemoglobin is stable at 8.1   Hyperkalemia -Resolved after patient received Lokelma 10 g p.o. x 1  Hypokalemia -Potassium mildly low at 3.2 -Replace potassium and follow BMP in am  Multiple myeloma without remission -Follows Dr. Orlie Dakin -Currently  on chemotherapy with Pomalyst; dose increased from 2 to 4 mg -Has been receiving radiation treatment to right hip/pelvis  Pulmonary embolism -Eliquis on hold due to hematuria    Medications     Chlorhexidine Gluconate Cloth  6 each Topical Daily   pneumococcal 20-valent conjugate vaccine  0.5 mL Intramuscular Tomorrow-1000   potassium chloride  40 mEq Oral Once   tamsulosin  0.4 mg Oral QPC supper     Data Reviewed:   CBG:  Recent Labs  Lab 08/19/22 1117  GLUCAP 103*    SpO2: 98 % O2 Flow Rate (L/min): 2 L/min    Vitals:   08/20/22 1808 08/20/22 2152 08/21/22 0143 08/21/22 0607  BP: (!) 120/51 (!) 104/50 116/61 110/71  Pulse: 73 66 68 77  Resp: 17 20 20 20   Temp: 98.5 F (36.9 C) 97.9 F (36.6 C) (!) 97.5 F (36.4 C) 97.8 F (36.6 C)  TempSrc: Oral Oral Oral Oral  SpO2: 98% 98% 98% 98%  Weight:      Height:          Data Reviewed:  Basic Metabolic Panel: Recent Labs  Lab 08/19/22 1110 08/20/22 0514 08/20/22 1458 08/21/22 0027  NA 136 139 141 139  K 3.7 4.5 5.6* 3.2*  CL 107 112* 112* 113*  CO2 25 20*  --  21*  GLUCOSE 110* 113* 120* 137*  BUN 31* 39* 59* 40*  CREATININE 0.83 1.15 1.10 0.87  CALCIUM 7.8* 7.0*  --  6.4*    CBC: Recent Labs  Lab 08/19/22 1100 08/19/22 1913 08/20/22 0514 08/20/22 1458 08/21/22 0027  WBC 1.9* 4.6 4.1  --  5.1  HGB 8.9* 8.3* 6.7* 6.8* 8.1*  8.2*  HCT 28.2* 27.9* 22.6* 20.0* 24.6*  24.8*  MCV  98.3 101.5* 100.0  --  90.4  PLT 94* 97* 102*  --  110*    LFT Recent Labs  Lab 08/19/22 1110 08/21/22 0027  AST 18 14*  ALT 17 13  ALKPHOS 29* 19*  BILITOT 0.5 0.4  PROT 6.2* 4.7*  ALBUMIN 1.8* <1.5*     Antibiotics: Anti-infectives (From admission, onward)    Start     Dose/Rate Route Frequency Ordered Stop   08/20/22 1700  azithromycin (ZITHROMAX) 500 mg in sodium chloride 0.9 % 250 mL IVPB        500 mg 250 mL/hr over 60 Minutes Intravenous Every 24 hours 08/19/22 2117 08/24/22 1659   08/20/22  1200  vancomycin (VANCOREADY) IVPB 1500 mg/300 mL  Status:  Discontinued       Placed in "Followed by" Linked Group   1,500 mg 150 mL/hr over 120 Minutes Intravenous Every 24 hours 08/19/22 1137 08/19/22 2127   08/19/22 2200  cefTRIAXone (ROCEPHIN) 2 g in sodium chloride 0.9 % 100 mL IVPB        2 g 200 mL/hr over 30 Minutes Intravenous Every 24 hours 08/19/22 2117 08/24/22 2159   08/19/22 1700  azithromycin (ZITHROMAX) 500 mg in sodium chloride 0.9 % 250 mL IVPB        500 mg 250 mL/hr over 60 Minutes Intravenous  Once 08/19/22 1649 08/19/22 2013   08/19/22 1200  vancomycin (VANCOREADY) IVPB 1750 mg/350 mL       Placed in "Followed by" Linked Group   1,750 mg 175 mL/hr over 120 Minutes Intravenous  Once 08/19/22 1137 08/19/22 1516   08/19/22 1130  ceFEPIme (MAXIPIME) 2 g in sodium chloride 0.9 % 100 mL IVPB        2 g 200 mL/hr over 30 Minutes Intravenous  Once 08/19/22 1117 08/19/22 1254        DVT prophylaxis: SCDs  Code Status: DNR  Family Communication:    CONSULTS    Subjective   Hematuria has resolved.  Wants to go home.  Currently back to normal sinus rhythm.  IV Cardizem has been discontinued.  Not requiring oxygen.  Objective    Physical Examination:   General-appears in no acute distress Heart-S1-S2, regular, no murmur auscultated Lungs-clear to auscultation bilaterally, no wheezing or crackles auscultated Abdomen-soft, nontender, no organomegaly Extremities-no edema in the lower extremities Neuro-alert, oriented x3, no focal deficit noted   Status is: Inpatient:             Meredeth Ide   [facility] If 7PM-7AM, please contact night-coverage at [email or web address], Office  [phone]   08/21/2022, 10:14 AM  LOS: 2 days

## 2022-08-21 NOTE — Plan of Care (Signed)
  Problem: Pain Managment: Goal: General experience of comfort will improve Outcome: Progressing   Problem: Safety: Goal: Ability to remain free from injury will improve Outcome: Progressing   Problem: Skin Integrity: Goal: Risk for impaired skin integrity will decrease Outcome: Progressing   

## 2022-08-22 ENCOUNTER — Ambulatory Visit: Payer: Medicare Other

## 2022-08-22 DIAGNOSIS — N401 Enlarged prostate with lower urinary tract symptoms: Secondary | ICD-10-CM | POA: Diagnosis not present

## 2022-08-22 DIAGNOSIS — J9601 Acute respiratory failure with hypoxia: Secondary | ICD-10-CM | POA: Diagnosis not present

## 2022-08-22 DIAGNOSIS — N368 Other specified disorders of urethra: Secondary | ICD-10-CM | POA: Diagnosis not present

## 2022-08-22 DIAGNOSIS — J189 Pneumonia, unspecified organism: Secondary | ICD-10-CM | POA: Diagnosis not present

## 2022-08-22 DIAGNOSIS — C9000 Multiple myeloma not having achieved remission: Secondary | ICD-10-CM | POA: Diagnosis not present

## 2022-08-22 LAB — BASIC METABOLIC PANEL
Anion gap: 3 — ABNORMAL LOW (ref 5–15)
BUN: 23 mg/dL (ref 8–23)
CO2: 22 mmol/L (ref 22–32)
Calcium: 6.3 mg/dL — CL (ref 8.9–10.3)
Chloride: 111 mmol/L (ref 98–111)
Creatinine, Ser: 0.56 mg/dL — ABNORMAL LOW (ref 0.61–1.24)
GFR, Estimated: >60 mL/min (ref >60)
Glucose, Bld: 98 mg/dL (ref 70–99)
Potassium: 3.2 mmol/L — ABNORMAL LOW (ref 3.5–5.1)
Sodium: 136 mmol/L (ref 135–145)

## 2022-08-22 LAB — POTASSIUM: Potassium: 4.5 mmol/L (ref 3.5–5.1)

## 2022-08-22 LAB — TROPONIN I (HIGH SENSITIVITY)
Troponin I (High Sensitivity): 18 ng/L — ABNORMAL HIGH (ref <18)
Troponin I (High Sensitivity): 18 ng/L — ABNORMAL HIGH (ref <18)

## 2022-08-22 LAB — CULTURE, BLOOD (ROUTINE X 2): Culture: NO GROWTH

## 2022-08-22 LAB — MAGNESIUM: Magnesium: 2 mg/dL (ref 1.7–2.4)

## 2022-08-22 MED ORDER — GABAPENTIN 400 MG PO CAPS
400.0000 mg | ORAL_CAPSULE | Freq: Two times a day (BID) | ORAL | Status: DC
  Administered 2022-08-22 – 2022-08-24 (×5): 400 mg via ORAL
  Filled 2022-08-22 (×4): qty 1

## 2022-08-22 MED ORDER — POTASSIUM CHLORIDE 20 MEQ PO PACK
40.0000 meq | PACK | Freq: Two times a day (BID) | ORAL | Status: DC
  Administered 2022-08-22: 40 meq via ORAL

## 2022-08-22 MED ORDER — METOPROLOL TARTRATE 25 MG PO TABS
ORAL_TABLET | ORAL | Status: AC
  Administered 2022-08-22: 25 mg
  Filled 2022-08-22: qty 1

## 2022-08-22 MED ORDER — APIXABAN 2.5 MG PO TABS
ORAL_TABLET | ORAL | Status: AC
  Administered 2022-08-22: 5 mg
  Filled 2022-08-22: qty 2

## 2022-08-22 MED ORDER — METOPROLOL TARTRATE 25 MG PO TABS
12.5000 mg | ORAL_TABLET | Freq: Two times a day (BID) | ORAL | Status: DC
  Administered 2022-08-22 – 2022-08-24 (×5): 12.5 mg via ORAL
  Filled 2022-08-22 (×4): qty 1

## 2022-08-22 MED ORDER — AZITHROMYCIN 500 MG PO TABS
500.0000 mg | ORAL_TABLET | Freq: Every day | ORAL | Status: AC
  Administered 2022-08-22 – 2022-08-23 (×2): 500 mg via ORAL
  Filled 2022-08-22: qty 1
  Filled 2022-08-22: qty 2

## 2022-08-22 MED ORDER — POTASSIUM CHLORIDE 20 MEQ PO PACK: 40.0000 meq | PACK | Freq: Once | ORAL | Status: DC

## 2022-08-22 MED ORDER — APIXABAN 5 MG PO TABS
5.0000 mg | ORAL_TABLET | Freq: Two times a day (BID) | ORAL | Status: DC
  Administered 2022-08-22 (×2): 5 mg via ORAL
  Filled 2022-08-22 (×2): qty 1

## 2022-08-22 MED ORDER — POTASSIUM CHLORIDE 20 MEQ PO PACK
PACK | ORAL | Status: AC
  Filled 2022-08-22: qty 2

## 2022-08-22 MED ORDER — GABAPENTIN 400 MG PO CAPS
ORAL_CAPSULE | ORAL | Status: AC
  Filled 2022-08-22: qty 1

## 2022-08-22 NOTE — Care Management Important Message (Signed)
Important Message  Patient Details IM Letter given. Name: Dean [name]. MRN: [id number] Date of Birth: [birth year]   Medicare Important Message Given:  Yes     Caren Macadam 08/22/2022, 1:56 PM

## 2022-08-22 NOTE — Progress Notes (Addendum)
[facility]  PROGRESS NOTE  Dean [name]. ZOX:[id number] DOB: [birth year] DOA: 08/19/2022 PCP: Donita Brooks, MD   Brief HPI:   87 year old male with medical history of multiple myeloma, BPH, pulmonary embolism on anticoagulation came to hospital with generalized weakness, reported hypoxemia with O2 sats in 80s on room air.  Patient was placed on 2 L of oxygen via nasal cannula. UA test not reported due to interference of urine pigments. In and out Cath attempted, met with resistance, coude finally placed- with drainage of 1 L urine, but patient has been oozing blood since placement of Coude. CTA chest negative for PE, shows small bilateral pleural effusions with partial lower lobe consolidations-atelectasis or pneumonia.  Extensive skeletal lytic lesions-slightly progressed from prior CT in January. IV Vanc and cefepime given, with CT chest findings, azithromycin was added. 3 L bolus given.   Assessment/Plan:   Acute hypoxemic respiratory failure -Resolved, no longer requiring oxygen -CTA chest negative for PE -Suggest partial lower lobe consolidation, atelectasis or pneumonia -Patient started on ceftriaxone Zithromax for pneumonia  Chest pain -likely atypical, resolved -he fell 2 weeks ago; ? musculoskeletal -check Trop x 2  Atrial fibrillation with RVR -Resolved, back to sinus rhythm -Went into A-fib with RVR this morning, started on Cardizem gtt. -Cardizem has been d/ced, will start low dose metoprolol 12.5 mg po bid -Anticoagulation on hold due to hematuria -discussed with urology, will start him back on eliquis  BPH/hematuria -Resolved -Bladder outlet obstruction, in and out cath met resistance, subsequent coud placed with patient starting draining blood around the coud catheter -Urology was consulted -Underwent cystoscopy yesterday for clot evacuation and fulguration   Anemia -Secondary to multiple myeloma -S/p 3 units PRBC -Hemoglobin is stable at  8.1   Hyperkalemia -Resolved after patient received Lokelma 10 g p.o. x 1  Hypokalemia -Potassium is still low - check serum magnesium -replace potassium and follow bmp in am  Multiple myeloma without remission -Follows Dr. Orlie Dakin -Currently on chemotherapy with Pomalyst; dose increased from 2 to 4 mg -Has been receiving radiation treatment to right hip/pelvis  Pulmonary embolism -Eliquis was on hold due to hematuria -will restart Eliquis today     Medications     azithromycin  500 mg Oral Daily   Chlorhexidine Gluconate Cloth  6 each Topical Daily   metoprolol tartrate  12.5 mg Oral BID   potassium chloride  40 mEq Oral BID   tamsulosin  0.4 mg Oral QPC supper     Data Reviewed:   CBG:  Recent Labs  Lab 08/19/22 1117  GLUCAP 103*    SpO2: 98 % O2 Flow Rate (L/min): 2 L/min    Vitals:   08/21/22 2127 08/22/22 0515 08/22/22 1129 08/22/22 1310  BP: 114/63 (!) 123/50 (!) 173/62 (!) 138/53  Pulse: 66 60  80  Resp: 18  18 19   Temp: 99.1 F (37.3 C) 98.2 F (36.8 C) 98.4 F (36.9 C) 98.2 F (36.8 C)  TempSrc: Oral Oral Axillary Oral  SpO2: 96% 96% 96% 98%  Weight:      Height:          Data Reviewed:  Basic Metabolic Panel: Recent Labs  Lab 08/19/22 1110 08/20/22 0514 08/20/22 1458 08/21/22 0027 08/22/22 0404  NA 136 139 141 139 136  K 3.7 4.5 5.6* 3.2* 3.2*  CL 107 112* 112* 113* 111  CO2 25 20*  --  21* 22  GLUCOSE 110* 113* 120* 137* 98  BUN 31* 39* 59* 40*  23  CREATININE 0.83 1.15 1.10 0.87 0.56*  CALCIUM 7.8* 7.0*  --  6.4* 6.3*    CBC: Recent Labs  Lab 08/19/22 1100 08/19/22 1913 08/20/22 0514 08/20/22 1458 08/21/22 0027  WBC 1.9* 4.6 4.1  --  5.1  HGB 8.9* 8.3* 6.7* 6.8* 8.1*  8.2*  HCT 28.2* 27.9* 22.6* 20.0* 24.6*  24.8*  MCV 98.3 101.5* 100.0  --  90.4  PLT 94* 97* 102*  --  110*    LFT Recent Labs  Lab 08/19/22 1110 08/21/22 0027  AST 18 14*  ALT 17 13  ALKPHOS 29* 19*  BILITOT 0.5 0.4  PROT 6.2*  4.7*  ALBUMIN 1.8* <1.5*     Antibiotics: Anti-infectives (From admission, onward)    Start     Dose/Rate Route Frequency Ordered Stop   08/22/22 1700  azithromycin (ZITHROMAX) tablet 500 mg        500 mg Oral Daily 08/22/22 0913 08/24/22 1659   08/20/22 1700  azithromycin (ZITHROMAX) 500 mg in sodium chloride 0.9 % 250 mL IVPB  Status:  Discontinued        500 mg 250 mL/hr over 60 Minutes Intravenous Every 24 hours 08/19/22 2117 08/22/22 0913   08/20/22 1200  vancomycin (VANCOREADY) IVPB 1500 mg/300 mL  Status:  Discontinued       Placed in "Followed by" Linked Group   1,500 mg 150 mL/hr over 120 Minutes Intravenous Every 24 hours 08/19/22 1137 08/19/22 2127   08/19/22 2200  cefTRIAXone (ROCEPHIN) 2 g in sodium chloride 0.9 % 100 mL IVPB        2 g 200 mL/hr over 30 Minutes Intravenous Every 24 hours 08/19/22 2117 08/24/22 2159   08/19/22 1700  azithromycin (ZITHROMAX) 500 mg in sodium chloride 0.9 % 250 mL IVPB        500 mg 250 mL/hr over 60 Minutes Intravenous  Once 08/19/22 1649 08/19/22 2013   08/19/22 1200  vancomycin (VANCOREADY) IVPB 1750 mg/350 mL       Placed in "Followed by" Linked Group   1,750 mg 175 mL/hr over 120 Minutes Intravenous  Once 08/19/22 1137 08/19/22 1516   08/19/22 1130  ceFEPIme (MAXIPIME) 2 g in sodium chloride 0.9 % 100 mL IVPB        2 g 200 mL/hr over 30 Minutes Intravenous  Once 08/19/22 1117 08/19/22 1254        DVT prophylaxis: SCDs  Code Status: DNR  Family Communication:    CONSULTS    Subjective   Complained of chest pain this morning, now improved. He says he fell 2 weeks ago hurting his chest wall.  Objective    Physical Examination:   Physical Exam:  Neck: Supple, no deformities, masses, or tenderness Lungs: Normal respiratory effort, bilateral clear to auscultation, no crackles or wheezes.  Heart: Regular rate and rhythm, S1 and S2 normal, no murmurs, rubs auscultated Abdomen: BS  normoactive,soft,nondistended,non-tender to palpation,no organomegaly Extremities: No pretibial edema, no erythema, no cyanosis, no clubbing Neuro : Alert and oriented to time, place and person, No focal deficits    Status is: Inpatient:        Meredeth Ide   [facility] If 7PM-7AM, please contact night-coverage at [email or web address], Office  [phone]   08/22/2022, 1:41 PM  LOS: 3 days

## 2022-08-22 NOTE — Treatment Plan (Signed)
TREATMENT PLAN NOTE:  Urine clear yellow after rechecking. At this time, ok to discharge with catheter in place when primary team feels he is medically ready. Patient's family should call the urology office to make appointment for catheter removal in 1 week given urinary retention upon placement.   Discussed with Dr. Alvester Morin.  Jerald Kief, MD, PhD [facility] Resident  [facility]

## 2022-08-22 NOTE — Evaluation (Signed)
Physical Therapy Evaluation Patient Details Name: Dean [name]. MRN: [id number] DOB: [birth year] Today's Date: 08/22/2022  History of Present Illness  Dean [name]. is a 87 y.o. male admitted with acute hypoxic respiratory failure. Pt s/p cystoscopy with clot evac and fulguration 7/17. PMH: multiple myeloma, BPH, pulmonary embolism  Clinical Impression  Pt admitted with above diagnosis. Pt from home with girlfriend, ind at baseline using RW for household distances and ADLs/IADLs, reports "many" falls in last 6 months, daughter assists with grocery shopping and provides transportation, and pt reports active with HHPT since Jan 2024. Pt reporting most recent fall where he fell forward onto his RW and now experiencing chest pain/soreness; pt showing therapist multiple bruised sites from recent falls. Pt able to mobilize BLE while supine in bed, but does report pain 9/10. Pt's HR noted to be in 80s when speaking with therapist, then increased to 166 and returned back down to the 80s- nurse notified of varying HR without mobility. Pt reporting high pain and fluctuating HR limiting eval; d/c TBD pending mobility. Pt currently with functional limitations due to the deficits listed below (see PT Problem List). Pt will benefit from acute skilled PT to increase their independence and safety with mobility to allow discharge.           Assistance Recommended at Discharge Frequent or constant Supervision/Assistance  If plan is discharge home, recommend the following:  Can travel by private vehicle  A lot of help with bathing/dressing/bathroom;A lot of help with walking and/or transfers;Assistance with cooking/housework;Assist for transportation;Help with stairs or ramp for entrance        Equipment Recommendations None recommended by PT  Recommendations for Other Services       Functional Status Assessment Patient has had a recent decline in their functional status and demonstrates the ability  to make significant improvements in function in a reasonable and predictable amount of time.     Precautions / Restrictions Precautions Precautions: Fall Precaution Comments: monitor HR Restrictions Weight Bearing Restrictions: No      Mobility  Bed Mobility               General bed mobility comments: pt declines due to pain, actively repositioning BLE in bed    Transfers                   General transfer comment: pt declines    Ambulation/Gait                  Stairs            Wheelchair Mobility     Tilt Bed    Modified Rankin (Stroke Patients Only)       Balance Overall balance assessment: History of Falls                                           Pertinent Vitals/Pain Pain Assessment Pain Assessment: 0-10 Pain Score: 9  Pain Location: "all over and my chest from the fall" Pain Descriptors / Indicators: Aching Pain Intervention(s): Limited activity within patient's tolerance, Monitored during session, Premedicated before session, Repositioned    Home Living Family/patient expects to be discharged to:: Private residence Living Arrangements: Spouse/significant other Available Help at Discharge: Family;Available PRN/intermittently Type of Home: Mobile home Home Access: Ramped entrance       Home Layout: One level Home Equipment:  Rolling Walker (2 wheels);Shower seat;Grab bars - tub/shower;BSC/3in1 Additional Comments: pt reports he lives with girlfriend of >30 years who is independent and doesn't need physical assist, but has Dementia    Prior Function Prior Level of Function : Independent/Modified Independent             Mobility Comments: pt amb with RW in the home, limited community distances ADLs Comments: pt ind with ADLs/IADLs     Hand Dominance        Extremity/Trunk Assessment   Upper Extremity Assessment Upper Extremity Assessment: Generalized weakness    Lower Extremity  Assessment Lower Extremity Assessment: Generalized weakness;RLE deficits/detail;LLE deficits/detail RLE Deficits / Details: pt actively wiggles toes, ankle AROM WFL, knee and hip grossly 2-/5, unable to perform SLR RLE Sensation: WNL;history of peripheral neuropathy (pt denies numbness/tingling currently) RLE Coordination: WNL LLE Deficits / Details: pt actively wiggles toes, ankle AROM WFL, knee and hip grossly 2-/5, unable to perform SLR LLE Sensation: WNL;history of peripheral neuropathy (pt denies numbness/tingling currently) LLE Coordination: WNL       Communication   Communication: No difficulties  Cognition Arousal/Alertness: Awake/alert Behavior During Therapy: WFL for tasks assessed/performed Overall Cognitive Status: Within Functional Limits for tasks assessed                                          General Comments      Exercises     Assessment/Plan    PT Assessment Patient needs continued PT services  PT Problem List Decreased strength;Decreased range of motion;Decreased activity tolerance;Decreased balance;Decreased mobility;Decreased knowledge of use of DME;Decreased safety awareness;Decreased knowledge of precautions;Cardiopulmonary status limiting activity;Pain       PT Treatment Interventions DME instruction;Gait training;Functional mobility training;Therapeutic activities;Therapeutic exercise;Balance training;Neuromuscular re-education;Patient/family education    PT Goals (Current goals can be found in the Care Plan section)  Acute Rehab PT Goals Patient Stated Goal: "I don't want to fall again" PT Goal Formulation: With patient Time For Goal Achievement: 09/05/22 Potential to Achieve Goals: Fair    Frequency Min 1X/week     Co-evaluation               AM-PAC PT "6 Clicks" Mobility  Outcome Measure Help needed turning from your back to your side while in a flat bed without using bedrails?: A Lot Help needed moving from lying  on your back to sitting on the side of a flat bed without using bedrails?: A Lot Help needed moving to and from a bed to a chair (including a wheelchair)?: Total Help needed standing up from a chair using your arms (e.g., wheelchair or bedside chair)?: Total Help needed to walk in hospital room?: Total Help needed climbing 3-5 steps with a railing? : Total 6 Click Score: 8    End of Session   Activity Tolerance: Patient limited by pain Patient left: in bed;with call bell/phone within reach;with bed alarm set;with family/visitor present Nurse Communication: Mobility status;Other (comment) (HR Up to 166) PT Visit Diagnosis: Other abnormalities of gait and mobility (R26.89);Muscle weakness (generalized) (M62.81);Pain;History of falling (Z91.81)    Time: 4098-1191 PT Time Calculation (min) (ACUTE ONLY): 17 min   Charges:   PT Evaluation $PT Eval Low Complexity: 1 Low   PT General Charges $$ ACUTE PT VISIT: 1 Visit         Tori Dexton Zwilling PT, DPT 08/22/22, 12:12 PM

## 2022-08-22 NOTE — Plan of Care (Signed)

## 2022-08-23 ENCOUNTER — Inpatient Hospital Stay (HOSPITAL_COMMUNITY): Payer: Medicare Other

## 2022-08-23 DIAGNOSIS — J9601 Acute respiratory failure with hypoxia: Secondary | ICD-10-CM | POA: Diagnosis not present

## 2022-08-23 DIAGNOSIS — N368 Other specified disorders of urethra: Secondary | ICD-10-CM | POA: Diagnosis not present

## 2022-08-23 DIAGNOSIS — N401 Enlarged prostate with lower urinary tract symptoms: Secondary | ICD-10-CM | POA: Diagnosis not present

## 2022-08-23 DIAGNOSIS — J189 Pneumonia, unspecified organism: Secondary | ICD-10-CM | POA: Diagnosis not present

## 2022-08-23 LAB — CULTURE, BLOOD (ROUTINE X 2): Special Requests: ADEQUATE

## 2022-08-23 LAB — BASIC METABOLIC PANEL
Anion gap: 4 — ABNORMAL LOW (ref 5–15)
BUN: 18 mg/dL (ref 8–23)
CO2: 22 mmol/L (ref 22–32)
Calcium: 6.6 mg/dL — ABNORMAL LOW (ref 8.9–10.3)
Chloride: 111 mmol/L (ref 98–111)
Creatinine, Ser: 0.6 mg/dL — ABNORMAL LOW (ref 0.61–1.24)
GFR, Estimated: >60 mL/min (ref >60)
Glucose, Bld: 92 mg/dL (ref 70–99)
Potassium: 3.9 mmol/L (ref 3.5–5.1)
Sodium: 137 mmol/L (ref 135–145)

## 2022-08-23 MED ORDER — GUAIFENESIN ER 600 MG PO TB12
600.0000 mg | ORAL_TABLET | Freq: Two times a day (BID) | ORAL | Status: DC
  Administered 2022-08-23 – 2022-08-24 (×3): 600 mg via ORAL
  Filled 2022-08-23 (×3): qty 1

## 2022-08-23 MED ORDER — APIXABAN 5 MG PO TABS
5.0000 mg | ORAL_TABLET | Freq: Two times a day (BID) | ORAL | Status: DC
  Administered 2022-08-23 – 2022-08-24 (×3): 5 mg via ORAL
  Filled 2022-08-23 (×2): qty 1

## 2022-08-23 MED ORDER — LEVALBUTEROL HCL 0.63 MG/3ML IN NEBU
INHALATION_SOLUTION | RESPIRATORY_TRACT | Status: AC
  Filled 2022-08-23: qty 3

## 2022-08-23 MED ORDER — GUAIFENESIN-DM 100-10 MG/5ML PO SYRP
5.0000 mL | ORAL_SOLUTION | ORAL | Status: DC | PRN
  Administered 2022-08-23 – 2022-08-24 (×3): 5 mL via ORAL
  Filled 2022-08-23 (×3): qty 10

## 2022-08-23 MED ORDER — LEVALBUTEROL HCL 0.63 MG/3ML IN NEBU
0.6300 mg | INHALATION_SOLUTION | Freq: Four times a day (QID) | RESPIRATORY_TRACT | Status: DC | PRN
  Administered 2022-08-23: 0.63 mg via RESPIRATORY_TRACT

## 2022-08-23 NOTE — Progress Notes (Addendum)
[facility]  PROGRESS NOTE  Dean [name]. NGE:[id number] DOB: September 15, 1935 DOA: 08/19/2022 PCP: Donita Brooks, MD   Brief HPI:   87 year old male with medical history of multiple myeloma, BPH, pulmonary embolism on anticoagulation came to hospital with generalized weakness, reported hypoxemia with O2 sats in 80s on room air.  Patient was placed on 2 L of oxygen via nasal cannula. UA test not reported due to interference of urine pigments. In and out Cath attempted, met with resistance, coude finally placed- with drainage of 1 L urine, but patient has been oozing blood since placement of Coude. CTA chest negative for PE, shows small bilateral pleural effusions with partial lower lobe consolidations-atelectasis or pneumonia.  Extensive skeletal lytic lesions-slightly progressed from prior CT in January. IV Vanc and cefepime given, with CT chest findings, azithromycin was added. 3 L bolus given.   Assessment/Plan:   Acute hypoxemic respiratory failure -Resolved, no longer requiring oxygen -CTA chest negative for PE -Suggest partial lower lobe consolidation, atelectasis or pneumonia -Patient started on ceftriaxone Zithromax for pneumonia -Will repeat chest x-ray today -Impaired coughing due to chest wall pain from fall,  start Mucinex 600 mg p.o. twice daily; flutter valve every 4 hours  Chest pain -likely atypical, resolved -he fell 2 weeks ago; ? musculoskeletal -Troponin x 2 negative  Atrial fibrillation with RVR -Resolved, back to sinus rhythm -Went into A-fib with RVR this morning, started on Cardizem gtt. -Cardizem has been d/ced, will start low dose metoprolol 12.5 mg po bid -Anticoagulation on hold due to hematuria -discussed with urology, will start him back on eliquis  BPH/hematuria -Resolved -Bladder outlet obstruction, in and out cath met resistance, subsequent coud placed with patient starting draining blood around the coud catheter -Urology was  consulted -Underwent cystoscopy yesterday for clot evacuation and fulguration   Anemia -Secondary to multiple myeloma -S/p 3 units PRBC -Hemoglobin is stable at 8.1   Hyperkalemia -Resolved after patient received Lokelma 10 g p.o. x 1  Hypokalemia -Potassium is still low - check serum magnesium -replace potassium and follow bmp in am  Hypocalcemia -Calcium is 6.6, albumin less than 1.5 -Corrected calcium 8.6  Multiple myeloma without remission -Follows Dr. Orlie Dakin -Currently on chemotherapy with Pomalyst; dose increased from 2 to 4 mg -Has been receiving radiation treatment to right hip/pelvis  Pulmonary embolism -Eliquis was on hold due to hematuria -Eliquis has been restarted     Medications     apixaban  5 mg Oral BID   azithromycin  500 mg Oral Daily   Chlorhexidine Gluconate Cloth  6 each Topical Daily   gabapentin  400 mg Oral BID   guaiFENesin  600 mg Oral BID   metoprolol tartrate  12.5 mg Oral BID   tamsulosin  0.4 mg Oral QPC supper     Data Reviewed:   CBG:  Recent Labs  Lab 08/19/22 1117  GLUCAP 103*    SpO2: 95 % O2 Flow Rate (L/min): 2 L/min    Vitals:   08/22/22 1310 08/22/22 2049 08/22/22 2051 08/23/22 0606  BP: (!) 138/53 (!) 144/53 (!) 144/53 (!) 130/57  Pulse: 80 78 89 (!) 57  Resp: 19  20 18   Temp: 98.2 F (36.8 C)  98.1 F (36.7 C) 97.7 F (36.5 C)  TempSrc: Oral  Oral Oral  SpO2: 98% 100% 100% 95%  Weight:      Height:          Data Reviewed:  Basic Metabolic Panel: Recent Labs  Lab  08/19/22 1110 08/20/22 0514 08/20/22 1458 08/21/22 0027 08/22/22 0404 08/22/22 1350 08/23/22 0300  NA 136 139 141 139 136  --  137  K 3.7 4.5 5.6* 3.2* 3.2* 4.5 3.9  CL 107 112* 112* 113* 111  --  111  CO2 25 20*  --  21* 22  --  22  GLUCOSE 110* 113* 120* 137* 98  --  92  BUN 31* 39* 59* 40* 23  --  18  CREATININE 0.83 1.15 1.10 0.87 0.56*  --  0.60*  CALCIUM 7.8* 7.0*  --  6.4* 6.3*  --  6.6*  MG  --   --   --   --    --  2.0  --     CBC: Recent Labs  Lab 08/19/22 1100 08/19/22 1913 08/20/22 0514 08/20/22 1458 08/21/22 0027  WBC 1.9* 4.6 4.1  --  5.1  HGB 8.9* 8.3* 6.7* 6.8* 8.1*  8.2*  HCT 28.2* 27.9* 22.6* 20.0* 24.6*  24.8*  MCV 98.3 101.5* 100.0  --  90.4  PLT 94* 97* 102*  --  110*    LFT Recent Labs  Lab 08/19/22 1110 08/21/22 0027  AST 18 14*  ALT 17 13  ALKPHOS 29* 19*  BILITOT 0.5 0.4  PROT 6.2* 4.7*  ALBUMIN 1.8* <1.5*     Antibiotics: Anti-infectives (From admission, onward)    Start     Dose/Rate Route Frequency Ordered Stop   08/22/22 1700  azithromycin (ZITHROMAX) tablet 500 mg        500 mg Oral Daily 08/22/22 0913 08/24/22 1659   08/20/22 1700  azithromycin (ZITHROMAX) 500 mg in sodium chloride 0.9 % 250 mL IVPB  Status:  Discontinued        500 mg 250 mL/hr over 60 Minutes Intravenous Every 24 hours 08/19/22 2117 08/22/22 0913   08/20/22 1200  vancomycin (VANCOREADY) IVPB 1500 mg/300 mL  Status:  Discontinued       Placed in "Followed by" Linked Group   1,500 mg 150 mL/hr over 120 Minutes Intravenous Every 24 hours 08/19/22 1137 08/19/22 2127   08/19/22 2200  cefTRIAXone (ROCEPHIN) 2 g in sodium chloride 0.9 % 100 mL IVPB        2 g 200 mL/hr over 30 Minutes Intravenous Every 24 hours 08/19/22 2117 08/24/22 2159   08/19/22 1700  azithromycin (ZITHROMAX) 500 mg in sodium chloride 0.9 % 250 mL IVPB        500 mg 250 mL/hr over 60 Minutes Intravenous  Once 08/19/22 1649 08/19/22 2013   08/19/22 1200  vancomycin (VANCOREADY) IVPB 1750 mg/350 mL       Placed in "Followed by" Linked Group   1,750 mg 175 mL/hr over 120 Minutes Intravenous  Once 08/19/22 1137 08/19/22 1516   08/19/22 1130  ceFEPIme (MAXIPIME) 2 g in sodium chloride 0.9 % 100 mL IVPB        2 g 200 mL/hr over 30 Minutes Intravenous  Once 08/19/22 1117 08/19/22 1254        DVT prophylaxis: SCDs  Code Status: DNR  Family Communication:    CONSULTS    Subjective   Complains of cough  this morning.  Was coughing up yellow-green phlegm which is changed color to white as per patient.  He has been on Rocephin for past 4 days, started on Zithromax yesterday for community-acquired pneumonia  Objective    Physical Examination:   Physical Exam:  General-appears in no acute distress Heart-S1-S2, regular, no murmur auscultated  Lungs-bilateral rhonchi auscultated Abdomen-soft, nontender, no organomegaly Extremities-no edema in the lower extremities Neuro-alert, oriented x3, no focal deficit noted    Status is: Inpatient:        Meredeth Ide   [facility] If 7PM-7AM, please contact night-coverage at [email or web address], Office  [phone]   08/23/2022, 10:30 AM  LOS: 4 days

## 2022-08-23 NOTE — Progress Notes (Signed)
Physical Therapy Treatment Patient Details Name: Dean [name]. MRN: [id number] DOB: [birth year] Today's Date: 08/23/2022   History of Present Illness Dean [name]. is a 87 y.o. male admitted with acute hypoxic respiratory failure. Pt s/p cystoscopy with clot evac and fulguration 7/17. PMH: multiple myeloma, BPH, pulmonary embolism    PT Comments  Pt not feeling well today but agreeable to attempt mobility as tolerated.  Pt assisted to standing x2 but fatigued quickly.  Pt requiring increased assist for bed mobility and mod assist to stand.  Pt states he has assist at home however uncertain they can provide this level of assist.  Patient will benefit from continued inpatient follow up therapy, <3 hours/day.     Assistance Recommended at Discharge Frequent or constant Supervision/Assistance  If plan is discharge home, recommend the following:  Can travel by private vehicle    A lot of help with bathing/dressing/bathroom;A lot of help with walking and/or transfers;Assistance with cooking/housework;Assist for transportation;Help with stairs or ramp for entrance      Equipment Recommendations  None recommended by PT    Recommendations for Other Services       Precautions / Restrictions Precautions Precautions: Fall Precaution Comments: monitor HR, CBI     Mobility  Bed Mobility Overal bed mobility: Needs Assistance Bed Mobility: Supine to Sit, Sit to Supine     Supine to sit: Max assist, +2 for physical assistance Sit to supine: Max assist, +2 for physical assistance   General bed mobility comments: assist for LEs over EOB, trunk upright, scooting to EOB, assist for lower body onto bed and repositioning    Transfers Overall transfer level: Needs assistance Equipment used: Rolling walker (2 wheels) Transfers: Sit to/from Stand Sit to Stand: Mod assist, +2 physical assistance           General transfer comment: verbal cues for hand placement, assist to rise, able  to stand for approx 15 seconds then fatigued, performed twice; used flutter device x8 while sitting EOB; HR 88-120 bpm during session per telemetry    Ambulation/Gait                   Stairs             Wheelchair Mobility     Tilt Bed    Modified Rankin (Stroke Patients Only)       Balance Overall balance assessment: History of Falls                                          Cognition Arousal/Alertness: Awake/alert Behavior During Therapy: WFL for tasks assessed/performed Overall Cognitive Status: Within Functional Limits for tasks assessed                                          Exercises      General Comments        Pertinent Vitals/Pain Pain Assessment Pain Assessment: 0-10 Pain Score: 7  Pain Location: "all over and my chest from the fall" Pain Descriptors / Indicators: Aching, Sore Pain Intervention(s): Repositioned, Monitored during session    Home Living                          Prior Function  PT Goals (current goals can now be found in the care plan section) Progress towards PT goals: Progressing toward goals    Frequency    Min 1X/week      PT Plan Discharge plan needs to be updated    Co-evaluation              AM-PAC PT "6 Clicks" Mobility   Outcome Measure  Help needed turning from your back to your side while in a flat bed without using bedrails?: A Lot Help needed moving from lying on your back to sitting on the side of a flat bed without using bedrails?: A Lot Help needed moving to and from a bed to a chair (including a wheelchair)?: A Lot Help needed standing up from a chair using your arms (e.g., wheelchair or bedside chair)?: A Lot Help needed to walk in hospital room?: Total Help needed climbing 3-5 steps with a railing? : Total 6 Click Score: 10    End of Session Equipment Utilized During Treatment: Gait belt Activity Tolerance: Patient  limited by fatigue Patient left: in bed;with call bell/phone within reach;with bed alarm set   PT Visit Diagnosis: Muscle weakness (generalized) (M62.81);Difficulty in walking, not elsewhere classified (R26.2)     Time: 1420-1440 PT Time Calculation (min) (ACUTE ONLY): 20 min  Charges:    $Therapeutic Activity: 8-22 mins PT General Charges $$ ACUTE PT VISIT: 1 Visit                    [name], DPT Physical Therapist Acute Rehabilitation Services Office: [phone]   [name] 08/23/2022, 5:04 PM

## 2022-08-23 NOTE — Plan of Care (Signed)
  Problem: Education: Goal: Knowledge of General Education information will improve Description: Including pain rating scale, medication(s)/side effects and non-pharmacologic comfort measures Outcome: Progressing   Problem: Health Behavior/Discharge Planning: Goal: Ability to manage health-related needs will improve Outcome: Progressing   Problem: Clinical Measurements: Goal: Will remain free from infection Outcome: Progressing Goal: Cardiovascular complication will be avoided Outcome: Progressing   Problem: Pain Managment: Goal: General experience of comfort will improve Outcome: Progressing   Problem: Safety: Goal: Ability to remain free from injury will improve Outcome: Progressing   Problem: Skin Integrity: Goal: Risk for impaired skin integrity will decrease Outcome: Progressing   Problem: Activity: Goal: Risk for activity intolerance will decrease Outcome: Not Progressing   Problem: Activity: Goal: Ability to tolerate increased activity will improve Outcome: Not Progressing

## 2022-08-24 DIAGNOSIS — J189 Pneumonia, unspecified organism: Secondary | ICD-10-CM | POA: Diagnosis not present

## 2022-08-24 DIAGNOSIS — N401 Enlarged prostate with lower urinary tract symptoms: Secondary | ICD-10-CM | POA: Diagnosis not present

## 2022-08-24 DIAGNOSIS — N368 Other specified disorders of urethra: Secondary | ICD-10-CM | POA: Diagnosis not present

## 2022-08-24 DIAGNOSIS — J9601 Acute respiratory failure with hypoxia: Secondary | ICD-10-CM | POA: Diagnosis not present

## 2022-08-24 LAB — COMPREHENSIVE METABOLIC PANEL
ALT: 13 U/L (ref 0–44)
AST: 11 U/L — ABNORMAL LOW (ref 15–41)
Albumin: 1.8 g/dL — ABNORMAL LOW (ref 3.5–5.0)
Alkaline Phosphatase: 20 U/L — ABNORMAL LOW (ref 38–126)
Anion gap: 5 (ref 5–15)
BUN: 17 mg/dL (ref 8–23)
CO2: 22 mmol/L (ref 22–32)
Calcium: 6.8 mg/dL — ABNORMAL LOW (ref 8.9–10.3)
Chloride: 105 mmol/L (ref 98–111)
Creatinine, Ser: 0.6 mg/dL — ABNORMAL LOW (ref 0.61–1.24)
GFR, Estimated: >60 mL/min (ref >60)
Glucose, Bld: 123 mg/dL — ABNORMAL HIGH (ref 70–99)
Potassium: 3.8 mmol/L (ref 3.5–5.1)
Sodium: 132 mmol/L — ABNORMAL LOW (ref 135–145)
Total Bilirubin: 0.5 mg/dL (ref 0.3–1.2)
Total Protein: 5.8 g/dL — ABNORMAL LOW (ref 6.5–8.1)

## 2022-08-24 LAB — CULTURE, BLOOD (ROUTINE X 2)
Culture: NO GROWTH
Special Requests: ADEQUATE

## 2022-08-24 LAB — CBC
HCT: 26.3 % — ABNORMAL LOW (ref 39.0–52.0)
Hemoglobin: 8.3 g/dL — ABNORMAL LOW (ref 13.0–17.0)
MCH: 30.3 pg (ref 26.0–34.0)
MCHC: 31.6 g/dL (ref 30.0–36.0)
MCV: 96 fL (ref 80.0–100.0)
Platelets: 147 10*3/uL — ABNORMAL LOW (ref 150–400)
RBC: 2.74 MIL/uL — ABNORMAL LOW (ref 4.22–5.81)
RDW: 17.8 % — ABNORMAL HIGH (ref 11.5–15.5)
WBC: 7.8 10*3/uL (ref 4.0–10.5)
nRBC: 0 % (ref 0.0–0.2)

## 2022-08-24 MED ORDER — GUAIFENESIN-DM 100-10 MG/5ML PO SYRP: 5.0000 mL | ORAL_SOLUTION | ORAL | 0 refills | Status: DC | PRN

## 2022-08-24 MED ORDER — METOPROLOL TARTRATE 25 MG PO TABS: 12.5000 mg | ORAL_TABLET | Freq: Two times a day (BID) | ORAL | 1 refills | Status: DC

## 2022-08-24 MED ORDER — AZITHROMYCIN 500 MG PO TABS
500.0000 mg | ORAL_TABLET | Freq: Every day | ORAL | Status: DC
  Administered 2022-08-24: 500 mg via ORAL
  Filled 2022-08-24: qty 1

## 2022-08-24 MED ORDER — AZITHROMYCIN 500 MG PO TABS: 500.0000 mg | ORAL_TABLET | Freq: Every day | ORAL | 0 refills | Status: DC

## 2022-08-24 MED ORDER — AZITHROMYCIN 500 MG PO TABS: 500.0000 mg | ORAL_TABLET | Freq: Every day | ORAL | 0 refills | Status: AC

## 2022-08-24 MED ORDER — HEPARIN SOD (PORK) LOCK FLUSH 100 UNIT/ML IV SOLN
500.0000 [IU] | INTRAVENOUS | Status: AC | PRN
  Administered 2022-08-24: 500 [IU]
  Filled 2022-08-24: qty 5

## 2022-08-24 NOTE — Discharge Summary (Signed)
Physician Discharge Summary   Patient: Dean [name]. MRN: [id number] DOB: 1936-01-07  Admit date:     08/19/2022  Discharge date: 08/24/22  Discharge Physician: Meredeth Ide   PCP: Donita Brooks, MD   Recommendations at discharge:   Follow-up PCP in 1 week  Discharge Diagnoses: Principal Problem:   Acute hypoxic respiratory failure (HCC) Active Problems:   BPH (benign prostatic hyperplasia)   PNA (pneumonia)   Urethral bleeding   Multiple myeloma without remission (HCC)   Pulmonary embolism (HCC)  Resolved Problems:   * No resolved hospital problems. *  Hospital Course:  87 year old male with medical history of multiple myeloma, BPH, pulmonary embolism on anticoagulation came to hospital with generalized weakness, reported hypoxemia with O2 sats in 80s on room air.  Patient was placed on 2 L of oxygen via nasal cannula. UA test not reported due to interference of urine pigments. In and out Cath attempted, met with resistance, coude finally placed- with drainage of 1 L urine, but patient has been oozing blood since placement of Coude. CTA chest negative for PE, shows small bilateral pleural effusions with partial lower lobe consolidations-atelectasis or pneumonia.  Extensive skeletal lytic lesions-slightly progressed from prior CT in January. IV Vanc and cefepime given, with CT chest findings, azithromycin was added. 3 L bolus given.    Assessment and Plan:  Acute hypoxemic respiratory failure -Resolved, no longer requiring oxygen -CTA chest negative for PE -Suggest partial lower lobe consolidation, atelectasis or pneumonia -Patient started on ceftriaxone Zithromax for pneumonia.  Will discharge on  Zithromax for 2 more days -Repeat chest x-ray yesterday showed bilateral atelectasis -Impaired coughing due to chest wall pain from fall, improved after giving oversensitive.  Will discharge on Robitussin as needed ; flutter valve every 4 hours   Chest pain -likely  atypical, resolved -he fell 2 weeks ago; ? musculoskeletal -Troponin x 2 negative   Atrial fibrillation with RVR -Resolved, back to sinus rhythm -Went into A-fib with RVR this morning, started on Cardizem gtt. -Cardizem has been d/ced, started on  metoprolol 12.5 mg po bid -discussed with urology, will start him back on eliquis   BPH/hematuria -Resolved -Bladder outlet obstruction, in and out cath met resistance, subsequent coud placed with patient starting draining blood around the coud catheter -Urology was consulted -Underwent cystoscopy yesterday for clot evacuation and fulguration  -Patient to go home with Foley catheter, follow-up urology as outpatient   Anemia -Secondary to multiple myeloma -S/p 3 units PRBC -Hemoglobin is stable at 8.1     Hyperkalemia -Resolved after patient received Lokelma 10 g p.o. x 1   Hypokalemia -Resolved   Hypocalcemia -Calcium is 6.6, albumin less than 1.5 -Corrected calcium 8.6   Multiple myeloma without remission -Follows Dr. Orlie Dakin -Currently on chemotherapy with Pomalyst; dose increased from 2 to 4 mg -Has been receiving radiation treatment to right hip/pelvis   Pulmonary embolism -Eliquis was on hold due to hematuria -Eliquis has been restarted       Consultants:  Procedures performed:  Disposition: Home Diet recommendation:  Discharge Diet Orders (From admission, onward)     Start     Ordered   08/24/22 0000  Diet - low sodium heart healthy        08/24/22 1221           Regular diet DISCHARGE MEDICATION: Allergies as of 08/24/2022       Reactions   Codeine Other (See Comments)   Headache   Morphine And Codeine  Other (See Comments)   Makes him feel weird   Revlimid [lenalidomide] Other (See Comments)   "Causes me to become weak"        Medication List     TAKE these medications    acetaminophen 500 MG tablet Commonly known as: TYLENOL Take 1,000 mg by mouth daily.   acyclovir 400 MG  tablet Commonly known as: ZOVIRAX Take 1 tablet (400 mg total) by mouth 2 (two) times daily.   ALPRAZolam 1 MG tablet Commonly known as: XANAX TAKE ONE TABLET (1 MG TOTAL) BY MOUTH THREE (THREE) TIMES DAILY AS NEEDED. FOR ANXIETY What changed: See the new instructions.   apixaban 5 MG Tabs tablet Commonly known as: Eliquis TAKE TWO TABLETS BY MOUTH (10MG  TOTAL) TWICE A DAY. THEN TAKE ONE TABLET (5MG ) TWICE A DAY STARTING ON 02/19/22   azithromycin 500 MG tablet Commonly known as: ZITHROMAX Take 1 tablet (500 mg total) by mouth daily for 2 days. Start taking on: August 25, 2022   CALCIUM-VITAMIN D PO Take 1 tablet by mouth 2 (two) times daily.   cyanocobalamin 500 MCG tablet Commonly known as: VITAMIN B12 Take 1 tablet (500 mcg total) by mouth daily.   dexamethasone 4 MG tablet Commonly known as: DECADRON TAKE ONE TABLET (4 MG TOTAL) BY MOUTH DAILY. What changed: See the new instructions.   fluticasone 50 MCG/ACT nasal spray Commonly known as: FLONASE PLACE 2 SPRAYS INTO BOTH NOSTRILS DAILY   folic acid 1 MG tablet Commonly known as: FOLVITE Take 1 tablet (1 mg total) by mouth daily.   furosemide 20 MG tablet Commonly known as: LASIX Take 1 tablet (20 mg total) by mouth daily as needed. What changed: when to take this   gabapentin 400 MG capsule Commonly known as: NEURONTIN Take 1 capsule (400 mg total) by mouth 3 (three) times daily. What changed: when to take this   guaiFENesin-dextromethorphan 100-10 MG/5ML syrup Commonly known as: ROBITUSSIN DM Take 5 mLs by mouth every 4 (four) hours as needed for cough (chest congestion).   lidocaine-prilocaine cream Commonly known as: EMLA Apply small amount over port one (1) hour prior to appointment.   metoprolol tartrate 25 MG tablet Commonly known as: LOPRESSOR Take 0.5 tablets (12.5 mg total) by mouth 2 (two) times daily.   montelukast 10 MG tablet Commonly known as: SINGULAIR TAKE 1 TABLET BY MOUTH EVERY NIGHT  AT BEDTIME   Oxycodone HCl 10 MG Tabs TAKE ONE TO TWO TABLETS BY MOUTH THREE TIMES DAILY AS NEEDED What changed: See the new instructions.   pomalidomide 4 MG capsule Commonly known as: POMALYST Take 1 capsule (4 mg total) by mouth daily. Take for 21 days, then hold for 7 days. Repeat every 28 days.   tamsulosin 0.4 MG Caps capsule Commonly known as: FLOMAX TAKE ONE CAPSULE BY MOUTH EVERY NIGHT ATBEDTIME   THERAWORX RELIEF EX Apply topically as needed.   traZODone 100 MG tablet Commonly known as: DESYREL TAKE THREE TABLETS BY MOUTH EVERY NIGHT AT BEDTIME What changed: See the new instructions.   TURMERIC PO Take 1 tablet by mouth 2 (two) times daily.        Discharge Exam: Filed Weights   08/19/22 1054  Weight: 87 kg   General-appears in no acute distress Heart-S1-S2, regular, no murmur auscultated Lungs-clear to auscultation bilaterally, no wheezing or crackles auscultated Abdomen-soft, nontender, no organomegaly Extremities-no edema in the lower extremities Neuro-alert, oriented x3, no focal deficit noted  Condition at discharge: good  The results of  significant diagnostics from this hospitalization (including imaging, microbiology, ancillary and laboratory) are listed below for reference.   Imaging Studies: DG CHEST PORT 1 VIEW  Result Date: 08/23/2022 CLINICAL DATA:  Cough. EXAM: PORTABLE CHEST 1 VIEW COMPARISON:  CT 08/19/2022 FINDINGS: There is a right chest wall port a catheter with tip in the SVC. Stable cardiomediastinal contours. Small bilateral pleural effusions are unchanged in the interval. Decreased aeration to both lower lobes identified compatible with atelectasis or airspace consolidation. Stable appearance of but peripheral opacity within the right upper lobe and central left upper lobe opacity. Multiple bilateral rib lesions are again seen compatible with metastatic disease. IMPRESSION: 1. Stable small bilateral pleural effusions. 2. Decreased  aeration to both lower lobes compatible with atelectasis or airspace consolidation. Similar to prior exam 3. Stable appearance of bilateral upper lobe opacities. 4. Multiple bilateral rib lesions compatible with metastatic disease. Electronically Signed   By: Signa Kell M.D.   On: 08/23/2022 15:02   CT ABDOMEN PELVIS WO CONTRAST  Result Date: 08/20/2022 CLINICAL DATA:  Hematuria, assess for clot burden history of multiple myeloma * Tracking Code: BO * EXAM: CT ABDOMEN AND PELVIS WITHOUT CONTRAST TECHNIQUE: Multidetector CT imaging of the abdomen and pelvis was performed following the standard protocol without IV contrast. RADIATION DOSE REDUCTION: This exam was performed according to the departmental dose-optimization program which includes automated exposure control, adjustment of the mA and/or kV according to patient size and/or use of iterative reconstruction technique. COMPARISON:  06/04/2015 FINDINGS: Lower chest: Small bilateral pleural effusions, with calcified bilateral atelectasis or consolidation, most likely chronic. Extensive three-vessel coronary artery calcifications and or stents. Aortic valve calcifications. Cardiomegaly. Hepatobiliary: No focal liver abnormality is seen. Status post cholecystectomy. No biliary dilatation. Pancreas: Unremarkable. No pancreatic ductal dilatation or surrounding inflammatory changes. Spleen: Normal in size without significant abnormality. Adrenals/Urinary Tract: Adrenal glands are unremarkable. Excreted contrast in the renal collecting systems, which limits assessment for urinary tract calculi. At least 1 small calculus of the inferior pole of the left kidney. Simple, benign renal cortical cysts, for which no further follow-up or characterization is required. No hydronephrosis. Urinary bladder is distended by heterogeneous clot about a Foley catheter, dimensions 11.6 x 10.5 x 9.9 cm (series 2, image 73, series 6, image 82). Stomach/Bowel: Stomach is within  normal limits. Appendix appears normal. No evidence of bowel wall thickening, distention, or inflammatory changes. Descending and sigmoid diverticulosis. Vascular/Lymphatic: Aortic atherosclerosis and vascular calcinosis. No enlarged abdominal or pelvic lymph nodes. Reproductive: Prostatomegaly. Other: No abdominal wall hernia or abnormality. No ascites. Musculoskeletal: No acute osseous findings. Widespread mixed lytic and sclerotic osseous metastatic disease throughout the included skeleton, particularly notable for multiple sizable lytic lesions with soft tissue components involving the bony pelvis (series 2, image 60). IMPRESSION: 1. Urinary bladder is distended by heterogeneous clot about a Foley catheter, dimensions 11.6 x 10.5 x 9.9 cm. 2. Excreted contrast in the renal collecting systems, which limits assessment for urinary tract calculi. At least 1 small calculus of the inferior pole of the left kidney. No hydronephrosis. 3. Prostatomegaly. 4. Widespread mixed lytic and sclerotic osseous metastatic disease throughout the included skeleton, particularly notable for multiple sizable lytic lesions with soft tissue components involving the bony pelvis, in keeping with history of multiple myeloma. 5. Small bilateral pleural effusions, with calcified bilateral atelectasis or consolidation, most likely chronic. 6. Coronary artery disease. Aortic Atherosclerosis (ICD10-I70.0). Electronically Signed   By: Jearld Lesch M.D.   On: 08/20/2022 12:54   CT Angio Chest  PE W and/or Wo Contrast  Result Date: 08/19/2022 CLINICAL DATA:  Weakness hypoxia EXAM: CT ANGIOGRAPHY CHEST WITH CONTRAST TECHNIQUE: Multidetector CT imaging of the chest was performed using the standard protocol during bolus administration of intravenous contrast. Multiplanar CT image reconstructions and MIPs were obtained to evaluate the vascular anatomy. RADIATION DOSE REDUCTION: This exam was performed according to the departmental  dose-optimization program which includes automated exposure control, adjustment of the mA and/or kV according to patient size and/or use of iterative reconstruction technique. CONTRAST:  75mL OMNIPAQUE IOHEXOL 350 MG/ML SOLN COMPARISON:  Chest x-ray 08/19/2022, CT chest 02/10/2022 FINDINGS: Cardiovascular: Satisfactory opacification of the pulmonary arteries to the segmental level. No evidence of pulmonary embolism. Nondiagnostic for assessment of bilateral lower lobe distal segmental and subsegmental PE due to motion and inadequate opacification. Cardiomegaly. Moderate aortic atherosclerosis. No aneurysm. Coronary vascular calcification. Mitral calcification. No pericardial effusion Mediastinum/Nodes: Midline trachea. 1.4 cm left thyroid nodule, no imaging follow-up is recommended. Subcentimeter right paratracheal nodes. Esophagus within normal limits Lungs/Pleura: Small bilateral pleural effusions. Similar lower lobe parenchymal calcifications. Partial lower lobe consolidations. Bandlike scarring in the right upper lobe with mild bronchiectasis. Upper Abdomen: No acute finding Musculoskeletal: Extensive skeletal metastatic disease with numerous lytic lesions involving the sternum, ribs, spine, and shoulders, appears slightly progressive as compared with January CT. Multiple old appearing bilateral rib fractures and old left clavicle fracture. Review of the MIP images confirms the above findings. IMPRESSION: 1. Negative for acute pulmonary embolus. Nondiagnostic for assessment of bilateral lower lobe distal segmental and subsegmental PE due to motion and inadequate opacification. 2. Small bilateral pleural effusions with partial lower lobe consolidations, atelectasis versus pneumonia. Bandlike scarring and mild bronchiectasis in the right upper lobe. 3. Extensive skeletal lytic disease corresponding to history of myeloma, appears slightly progressive as compared with January CT. 4. Aortic atherosclerosis. Aortic  Atherosclerosis (ICD10-I70.0). Electronically Signed   By: Jasmine Pang M.D.   On: 08/19/2022 16:41   DG Chest Port 1 View  Result Date: 08/19/2022 CLINICAL DATA:  Shortness of breath, hypoxia. EXAM: PORTABLE CHEST 1 VIEW COMPARISON:  February 10, 2022. FINDINGS: Stable cardiomediastinal silhouette. Right internal jugular Port-A-Cath is unchanged in position. There are again noted multiple bilateral rib lytic lesions consistent with metastatic disease. Minimal bibasilar subsegmental atelectasis is again noted. IMPRESSION: Multiple bilateral rib lytic lesions are again noted consistent with metastatic disease. Bibasilar subsegmental atelectasis is noted. Electronically Signed   By: Lupita Raider M.D.   On: 08/19/2022 12:14    Microbiology: Results for orders placed or performed during the hospital encounter of 08/19/22  Resp panel by RT-PCR (RSV, Flu A&B, Covid) Anterior Nasal Swab     Status: None   Collection Time: 08/19/22 11:15 AM   Specimen: Anterior Nasal Swab  Result Value Ref Range Status   SARS Coronavirus 2 by RT PCR NEGATIVE NEGATIVE Final    Comment: (NOTE) SARS-CoV-2 target nucleic acids are NOT DETECTED.  The SARS-CoV-2 RNA is generally detectable in upper respiratory specimens during the acute phase of infection. The lowest concentration of SARS-CoV-2 viral copies this assay can detect is 138 copies/mL. A negative result does not preclude SARS-Cov-2 infection and should not be used as the sole basis for treatment or other patient management decisions. A negative result may occur with  improper specimen collection/handling, submission of specimen other than nasopharyngeal swab, presence of viral mutation(s) within the areas targeted by this assay, and inadequate number of viral copies(<138 copies/mL). A negative result must be combined with clinical  observations, patient history, and epidemiological information. The expected result is Negative.  Fact Sheet for Patients:   BloggerCourse.com  Fact Sheet for Healthcare Providers:  SeriousBroker.it  This test is no t yet approved or cleared by the [place] FDA and  has been authorized for detection and/or diagnosis of SARS-CoV-2 by FDA under an Emergency Use Authorization (EUA). This EUA will remain  in effect (meaning this test can be used) for the duration of the COVID-19 declaration under Section 564(b)(1) of the Act, 21 U.S.C.section 360bbb-3(b)(1), unless the authorization is terminated  or revoked sooner.       Influenza A by PCR NEGATIVE NEGATIVE Final   Influenza B by PCR NEGATIVE NEGATIVE Final    Comment: (NOTE) The Xpert Xpress SARS-CoV-2/FLU/RSV plus assay is intended as an aid in the diagnosis of influenza from Nasopharyngeal swab specimens and should not be used as a sole basis for treatment. Nasal washings and aspirates are unacceptable for Xpert Xpress SARS-CoV-2/FLU/RSV testing.  Fact Sheet for Patients: BloggerCourse.com  Fact Sheet for Healthcare Providers: SeriousBroker.it  This test is not yet approved or cleared by the [place] FDA and has been authorized for detection and/or diagnosis of SARS-CoV-2 by FDA under an Emergency Use Authorization (EUA). This EUA will remain in effect (meaning this test can be used) for the duration of the COVID-19 declaration under Section 564(b)(1) of the Act, 21 U.S.C. section 360bbb-3(b)(1), unless the authorization is terminated or revoked.     Resp Syncytial Virus by PCR NEGATIVE NEGATIVE Final    Comment: (NOTE) Fact Sheet for Patients: BloggerCourse.com  Fact Sheet for Healthcare Providers: SeriousBroker.it  This test is not yet approved or cleared by the [place] FDA and has been authorized for detection and/or diagnosis of SARS-CoV-2 by FDA under an Emergency Use  Authorization (EUA). This EUA will remain in effect (meaning this test can be used) for the duration of the COVID-19 declaration under Section 564(b)(1) of the Act, 21 U.S.C. section 360bbb-3(b)(1), unless the authorization is terminated or revoked.  Performed at [facility], [address], [place], [place] [address]   Blood Culture (routine x 2)     Status: None   Collection Time: 08/19/22 12:00 PM   Specimen: BLOOD  Result Value Ref Range Status   Specimen Description BLOOD BLOOD LEFT HAND  Final   Special Requests   Final    BOTTLES DRAWN AEROBIC AND ANAEROBIC Blood Culture adequate volume   Culture   Final    NO GROWTH 5 DAYS Performed at [facility], [address], [place], [place] [address]    Report Status 08/24/2022 FINAL  Final  Blood Culture (routine x 2)     Status: None   Collection Time: 08/19/22 12:00 PM   Specimen: BLOOD  Result Value Ref Range Status   Specimen Description BLOOD RAC  Final   Special Requests   Final    BOTTLES DRAWN AEROBIC AND ANAEROBIC Blood Culture adequate volume   Culture   Final    NO GROWTH 5 DAYS Performed at [facility], [address] [place], [place] [address]    Report Status 08/24/2022 FINAL  Final   *Note: Due to a large number of results and/or encounters for the requested time period, some results have not been displayed. A complete set of results can be found in Results Review.    Labs: CBC: Recent Labs  Lab 08/19/22 1100 08/19/22 1913 08/20/22 0514 08/20/22 1458 08/21/22 0027 08/24/22 0322  WBC 1.9* 4.6 4.1  --  5.1 7.8  HGB 8.9* 8.3* 6.7* 6.8* 8.1*  8.2* 8.3*  HCT 28.2* 27.9* 22.6* 20.0* 24.6*  24.8* 26.3*  MCV 98.3 101.5* 100.0  --  90.4 96.0  PLT 94* 97* 102*  --  110* 147*   Basic Metabolic Panel: Recent Labs  Lab 08/20/22 0514 08/20/22 1458 08/21/22 0027 08/22/22 0404 08/22/22 1350 08/23/22 0300 08/24/22 0322  NA 139 141 139 136  --  137 132*  K 4.5 5.6* 3.2* 3.2* 4.5 3.9 3.8  CL 112* 112*  113* 111  --  111 105  CO2 20*  --  21* 22  --  22 22  GLUCOSE 113* 120* 137* 98  --  92 123*  BUN 39* 59* 40* 23  --  18 17  CREATININE 1.15 1.10 0.87 0.56*  --  0.60* 0.60*  CALCIUM 7.0*  --  6.4* 6.3*  --  6.6* 6.8*  MG  --   --   --   --  2.0  --   --    Liver Function Tests: Recent Labs  Lab 08/19/22 1110 08/21/22 0027 08/24/22 0322  AST 18 14* 11*  ALT 17 13 13   ALKPHOS 29* 19* 20*  BILITOT 0.5 0.4 0.5  PROT 6.2* 4.7* 5.8*  ALBUMIN 1.8* <1.5* 1.8*   CBG: Recent Labs  Lab 08/19/22 1117  GLUCAP 103*    Discharge time spent: greater than 30 minutes.  Signed: Meredeth Ide, MD [facility] 08/24/2022

## 2022-08-24 NOTE — Plan of Care (Signed)
  Problem: Clinical Measurements: Goal: Ability to maintain clinical measurements within normal limits will improve 08/24/2022 1354 by Shirleen Schirmer, RN Outcome: Adequate for Discharge 08/24/2022 1225 by Shirleen Schirmer, RN Outcome: Adequate for Discharge 08/24/2022 1111 by Shirleen Schirmer, RN Outcome: Progressing Goal: Will remain free from infection 08/24/2022 1354 by Shirleen Schirmer, RN Outcome: Adequate for Discharge 08/24/2022 1225 by Shirleen Schirmer, RN Outcome: Adequate for Discharge 08/24/2022 1111 by Shirleen Schirmer, RN Outcome: Progressing Goal: Diagnostic test results will improve 08/24/2022 1354 by Shirleen Schirmer, RN Outcome: Adequate for Discharge 08/24/2022 1225 by Shirleen Schirmer, RN Outcome: Adequate for Discharge 08/24/2022 1111 by Shirleen Schirmer, RN Outcome: Progressing Goal: Respiratory complications will improve 08/24/2022 1354 by Shirleen Schirmer, RN Outcome: Adequate for Discharge 08/24/2022 1225 by Shirleen Schirmer, RN Outcome: Adequate for Discharge 08/24/2022 1111 by Shirleen Schirmer, RN Outcome: Progressing Goal: Cardiovascular complication will be avoided 08/24/2022 1354 by Shirleen Schirmer, RN Outcome: Adequate for Discharge 08/24/2022 1225 by Shirleen Schirmer, RN Outcome: Adequate for Discharge 08/24/2022 1111 by Shirleen Schirmer, RN Outcome: Progressing

## 2022-08-24 NOTE — Plan of Care (Signed)
  Problem: Elimination: Goal: Will not experience complications related to bowel motility 08/24/2022 1542 by Shirleen Schirmer, RN Outcome: Adequate for Discharge 08/24/2022 1541 by Shirleen Schirmer, RN Outcome: Adequate for Discharge 08/24/2022 1354 by Shirleen Schirmer, RN Outcome: Adequate for Discharge 08/24/2022 1225 by Shirleen Schirmer, RN Outcome: Adequate for Discharge 08/24/2022 1111 by Shirleen Schirmer, RN Outcome: Progressing Goal: Will not experience complications related to urinary retention 08/24/2022 1542 by Shirleen Schirmer, RN Outcome: Adequate for Discharge 08/24/2022 1541 by Shirleen Schirmer, RN Outcome: Adequate for Discharge 08/24/2022 1354 by Shirleen Schirmer, RN Outcome: Adequate for Discharge 08/24/2022 1225 by Shirleen Schirmer, RN Outcome: Adequate for Discharge 08/24/2022 1111 by Shirleen Schirmer, RN Outcome: Progressing   Problem: Elimination: Goal: Will not experience complications related to urinary retention 08/24/2022 1542 by Shirleen Schirmer, RN Outcome: Adequate for Discharge 08/24/2022 1541 by Shirleen Schirmer, RN Outcome: Adequate for Discharge 08/24/2022 1354 by Shirleen Schirmer, RN Outcome: Adequate for Discharge 08/24/2022 1225 by Shirleen Schirmer, RN Outcome: Adequate for Discharge 08/24/2022 1111 by Shirleen Schirmer, RN Outcome: Progressing

## 2022-08-24 NOTE — Progress Notes (Signed)
Patient discharged home with daughter. Discharge instruction given and explained/demonstrated to patient/daughter, including foley care, they verbalized understanding. Patient to F/U with urology outpatient. Patient in no pain or distress.

## 2022-08-24 NOTE — Plan of Care (Signed)

## 2022-08-24 NOTE — Plan of Care (Signed)
  Problem: Education: Goal: Knowledge of General Education information will improve Description: Including pain rating scale, medication(s)/side effects and non-pharmacologic comfort measures 08/24/2022 1225 by Shirleen Schirmer, RN Outcome: Adequate for Discharge 08/24/2022 1111 by Shirleen Schirmer, RN Outcome: Progressing   Problem: Clinical Measurements: Goal: Ability to maintain clinical measurements within normal limits will improve 08/24/2022 1225 by Shirleen Schirmer, RN Outcome: Adequate for Discharge 08/24/2022 1111 by Shirleen Schirmer, RN Outcome: Progressing Goal: Will remain free from infection 08/24/2022 1225 by Shirleen Schirmer, RN Outcome: Adequate for Discharge 08/24/2022 1111 by Shirleen Schirmer, RN Outcome: Progressing Goal: Diagnostic test results will improve 08/24/2022 1225 by Shirleen Schirmer, RN Outcome: Adequate for Discharge 08/24/2022 1111 by Shirleen Schirmer, RN Outcome: Progressing Goal: Respiratory complications will improve 08/24/2022 1225 by Shirleen Schirmer, RN Outcome: Adequate for Discharge 08/24/2022 1111 by Shirleen Schirmer, RN Outcome: Progressing Goal: Cardiovascular complication will be avoided 08/24/2022 1225 by Shirleen Schirmer, RN Outcome: Adequate for Discharge 08/24/2022 1111 by Shirleen Schirmer, RN Outcome: Progressing   Problem: Health Behavior/Discharge Planning: Goal: Ability to manage health-related needs will improve 08/24/2022 1225 by Shirleen Schirmer, RN Outcome: Adequate for Discharge 08/24/2022 1111 by Shirleen Schirmer, RN Outcome: Progressing

## 2022-08-24 NOTE — TOC Transition Note (Signed)
Transition of Care [facility]) - CM/SW Discharge Note   Patient Details  Name: Jerret Mcbane. MRN: [id number] Date of Birth: 1935/03/14  Transition of Care [facility]) CM/SW Contact:  Adrian Prows, RN Phone Number: 08/24/2022, 1:04 PM   Clinical Narrative:    Sherron Monday w/ pt and family in room; they declined SNF; pt active w/ [facility] for HHPT and would like to resume service; Dr Sharl Ma notified; Barbara Cower at agency also notified; pt confirmed d/c address [address] [place], [place] [address]; agency contact info placed in follow up provider section of d/c sntructions; no TOC needs.   Final next level of care: Home w Home Health Services Barriers to Discharge: No Barriers Identified   Patient Goals and CMS Choice CMS Medicare.gov Compare Post Acute Care list provided to:: Patient Represenative (must comment) Choice offered to / list presented to : Adult Children  Discharge Placement                         Discharge Plan and Services Additional resources added to the After Visit Summary for     Discharge Planning Services: CM Consult Post Acute Care Choice: Home Health                      [facility] Agency: [facility] Date [facility] Agency Contacted: 08/24/22 Time HH Agency Contacted: 1304 Representative spoke with at [facility] Agency: Barbara Cower  Social Determinants of Health (SDOH) Interventions SDOH Screenings   Food Insecurity: No Food Insecurity (08/19/2022)  Housing: Low Risk  (08/19/2022)  Transportation Needs: No Transportation Needs (08/19/2022)  Utilities: Not At Risk (08/19/2022)  Alcohol Screen: Low Risk  (01/09/2022)  Depression (PHQ2-9): Low Risk  (08/13/2022)  Financial Resource Strain: Low Risk  (03/21/2022)  Physical Activity: Inactive (01/09/2022)  Social Connections: Moderately Isolated (01/09/2022)  Stress: Stress Concern Present (01/09/2022)  Tobacco Use: Low Risk  (08/19/2022)     Readmission Risk Interventions    08/20/2022    1:21 PM  Readmission Risk  Prevention Plan  Transportation Screening Complete  PCP or Specialist Appt within 3-5 Days Complete  HRI or Home Care Consult Complete  Social Work Consult for Recovery Care Planning/Counseling Complete  Palliative Care Screening Not Applicable  Medication Review Ocean[facility]) Complete

## 2022-08-25 ENCOUNTER — Telehealth: Payer: Self-pay

## 2022-08-25 ENCOUNTER — Ambulatory Visit: Payer: Medicare Other

## 2022-08-25 ENCOUNTER — Telehealth: Payer: Self-pay | Admitting: *Deleted

## 2022-08-25 NOTE — Telephone Encounter (Signed)
Daughter called reporting that patient has been discharged from hospital and is very weak and that he has been off his Pomalyst for a week and she feels that he is too weak to restart it at this time.

## 2022-08-25 NOTE — Telephone Encounter (Signed)
Call returned to [facility] and informed per Dr Orlie Dakin to hold his Pomalyst until he sees DM again She repeated back to me

## 2022-08-25 NOTE — Transitions of Care (Post Inpatient/ED Visit) (Signed)
08/25/2022  Name: Dean [name]. MRN: [id number] DOB: Oct 31, 1935  Today's TOC FU Call Status: Today's TOC FU Call Status:: Successful TOC FU Call Competed TOC FU Call Complete Date: 08/25/22  Transition Care Management Follow-up Telephone Call Date of Discharge: 08/24/22 Discharge Facility: [facility]) Type of Discharge: Inpatient Admission Primary Inpatient Discharge Diagnosis:: Acute Hypoxic Respiratory Failure How have you been since you were released from the hospital?: Same (Per daughter, [name], patient is very weak.  She feels he was sent home too soon from hospital.) Any questions or concerns?: No  Items Reviewed: Did you receive and understand the discharge instructions provided?: Yes Medications obtained,verified, and reconciled?: Yes (Medications Reviewed) Any new allergies since your discharge?: No Dietary orders reviewed?: Yes Type of Diet Ordered:: Low sodium heart healthy Do you have support at home?: Yes People in Home: child(ren), adult Name of Support/Comfort Primary Source: [name]  Medications Reviewed Today: Medications Reviewed Today     Reviewed by Jodelle Gross, RN (Case Manager) on 08/25/22 at 1540  Med List Status: <None>   Medication Order Taking? Sig Documenting Provider Last Dose Status Informant  acetaminophen (TYLENOL) 500 MG tablet [id number]  Take 1,000 mg by mouth daily. [provider]  Active Child  acyclovir (ZOVIRAX) 400 MG tablet [id number] Yes Take 1 tablet (400 mg total) by mouth 2 (two) times daily. Jeralyn Ruths, MD Taking Active Child  ALPRAZolam Prudy Feeler) 1 MG tablet [id number] Yes TAKE ONE TABLET (1 MG TOTAL) BY MOUTH THREE (THREE) TIMES DAILY AS NEEDED. FOR ANXIETY  Patient taking differently: Take 1 mg by mouth 3 (three) times daily as needed for anxiety or sleep.   Borders, Daryl Eastern, NP Taking Active Child  apixaban (ELIQUIS) 5 MG TABS tablet [id number] Yes TAKE TWO TABLETS BY MOUTH (10MG  TOTAL) TWICE A DAY.  THEN TAKE ONE TABLET (5MG ) TWICE A DAY STARTING ON 02/19/22 Jeralyn Ruths, MD Taking Active Child  azithromycin (ZITHROMAX) 500 MG tablet [id number] Yes Take 1 tablet (500 mg total) by mouth daily for 2 days. Meredeth Ide, MD Taking Active   CALCIUM-VITAMIN D PO [id number]  Take 1 tablet by mouth 2 (two) times daily.  [provider]  Active Child  cyanocobalamin (VITAMIN B12) 500 MCG tablet [id number]  Take 1 tablet (500 mcg total) by mouth daily. Catarina Hartshorn, MD  Active Child  dexamethasone (DECADRON) 4 MG tablet [id number] Yes TAKE ONE TABLET (4 MG TOTAL) BY MOUTH DAILY.  Patient taking differently: Take 4 mg by mouth every other day.   Borders, Daryl Eastern, NP Taking Active Child  fluticasone (FLONASE) 50 MCG/ACT nasal spray [id number] Yes PLACE 2 SPRAYS INTO BOTH NOSTRILS DAILY Donita Brooks, MD Taking Active Child  folic acid (FOLVITE) 1 MG tablet [id number] Yes Take 1 tablet (1 mg total) by mouth daily. Jeralyn Ruths, MD Taking Active Child  furosemide (LASIX) 20 MG tablet [id number] Yes Take 1 tablet (20 mg total) by mouth daily as needed.  Patient taking differently: Take 20 mg by mouth every other day.   Jeralyn Ruths, MD Taking Active Child  gabapentin (NEURONTIN) 400 MG capsule [id number] Yes Take 1 capsule (400 mg total) by mouth 3 (three) times daily.  Patient taking differently: Take 400 mg by mouth 2 (two) times daily.   Jeralyn Ruths, MD Taking Active Child  guaiFENesin-dextromethorphan [facility] DM) 100-10 MG/5ML syrup [id number] Yes Take 5 mLs by mouth every 4 (four) hours as needed for cough (chest congestion). Meredeth Ide, MD  Taking Active   Homeopathic Products [facility] RELIEF EX) [id number]  Apply topically as needed. [provider]  Active Child  lidocaine-prilocaine (EMLA) cream [id number]  Apply small amount over port one (1) hour prior to appointment. Doreatha Massed, MD  Active Child  metoprolol tartrate (LOPRESSOR) 25 MG  tablet [id number] Yes Take 0.5 tablets (12.5 mg total) by mouth 2 (two) times daily. Meredeth Ide, MD Taking Active   montelukast (SINGULAIR) 10 MG tablet [id number] Yes TAKE 1 TABLET BY MOUTH EVERY NIGHT AT BEDTIME Donita Brooks, MD Taking Active Child  Oxycodone HCl 10 MG TABS [id number] Yes TAKE ONE TO TWO TABLETS BY MOUTH THREE TIMES DAILY AS NEEDED  Patient taking differently: Take 1-2 tablets by mouth 3 (three) times daily. TAKE ONE TO TWO TABLETS BY MOUTH THREE TIMES DAILY AS NEEDED, Dr told Patient he could take an extra tablet if needed throughout the day.   Borders, Daryl Eastern, NP Taking Active Child  pomalidomide (POMALYST) 4 MG capsule [id number] No Take 1 capsule (4 mg total) by mouth daily. Take for 21 days, then hold for 7 days. Repeat every 28 days.  Patient not taking: Reported on 08/25/2022   Jeralyn Ruths, MD Not Taking Active Child           Med Note Electa Sniff [facility]   Mon Aug 25, 2022  3:39 PM) PCP requested patient hold until HFU  tamsulosin (FLOMAX) 0.4 MG CAPS capsule [id number] Yes TAKE ONE CAPSULE BY MOUTH EVERY NIGHT ATBEDTIME Donita Brooks, MD Taking Active Child  traZODone (DESYREL) 100 MG tablet [id number] Yes TAKE THREE TABLETS BY MOUTH EVERY NIGHT AT BEDTIME  Patient taking differently: Take 300 mg by mouth at bedtime.   Borders, Daryl Eastern, NP Taking Active Child  TURMERIC PO [id number] Yes Take 1 tablet by mouth 2 (two) times daily. [provider] Taking Active Child  Med List Note Remi Haggard, RPH-CPP 01/09/22 1514): Pomalyst filled at [facility]            Home Care and Equipment/Supplies: Were Home Health Services Ordered?: Yes Name of Home Health Agency:: [facility] Has Agency set up a time to come to your home?: No EMR reviewed for Home Health Orders:  (This Clinical research associate called [facility] and they have been trying to reach patients daughters.  They will call again) Any new equipment or medical supplies ordered?:  No  Functional Questionnaire: Do you need assistance with bathing/showering or dressing?: Yes Do you need assistance with meal preparation?: Yes Do you need assistance with eating?: No Do you have difficulty maintaining continence: No Do you need assistance with getting out of bed/getting out of a chair/moving?: Yes Do you have difficulty managing or taking your medications?: Yes  Follow up appointments reviewed: PCP Follow-up appointment confirmed?: Yes Date of PCP follow-up appointment?: 08/29/22 Follow-up Provider: Dr. Tanya Nones Specialist [facility] Follow-up appointment confirmed?: Yes Date of Specialist follow-up appointment?: 09/09/22 Follow-Up Specialty Provider:: Dr. Darcel Bayley Do you need transportation to your follow-up appointment?: No Do you understand care options if your condition(s) worsen?: Yes-patient verbalized understanding  SDOH Interventions Today    Flowsheet Row Most Recent Value  SDOH Interventions   Food Insecurity Interventions Intervention Not Indicated  Housing Interventions Intervention Not Indicated  Transportation Interventions Intervention Not Indicated      TOC Interventions Today    Flowsheet Row Most Recent Value  TOC Interventions   TOC Interventions Discussed/Reviewed TOC Interventions Discussed, TOC Interventions Reviewed, Arranged PCP follow up within 7 days/Care Guide  scheduled  Gwenevere Ghazi, Care Guide spoke with patient's daughter and scheduled video visit for 08/29/22@12 :00.]       Jodelle Gross, RN, BSN, CCM Care Management Coordinator [facility] Phone: [phone]/Fax: [phone]

## 2022-08-25 NOTE — Telephone Encounter (Signed)
Received a call from patients daughter requesting to cancel patients upcoming radiation treatments for this week. Per daughter patient was discharged from [facility] on 08/24/22 and continues to have weakness. [facility] made aware.

## 2022-08-26 ENCOUNTER — Ambulatory Visit: Payer: Medicare Other

## 2022-08-27 ENCOUNTER — Ambulatory Visit: Payer: Medicare Other

## 2022-08-28 ENCOUNTER — Ambulatory Visit: Payer: Medicare Other

## 2022-08-28 DIAGNOSIS — G629 Polyneuropathy, unspecified: Secondary | ICD-10-CM | POA: Diagnosis not present

## 2022-08-28 DIAGNOSIS — Z79891 Long term (current) use of opiate analgesic: Secondary | ICD-10-CM | POA: Diagnosis not present

## 2022-08-28 DIAGNOSIS — J189 Pneumonia, unspecified organism: Secondary | ICD-10-CM | POA: Diagnosis not present

## 2022-08-28 DIAGNOSIS — M6281 Muscle weakness (generalized): Secondary | ICD-10-CM | POA: Diagnosis not present

## 2022-08-28 DIAGNOSIS — D630 Anemia in neoplastic disease: Secondary | ICD-10-CM | POA: Diagnosis not present

## 2022-08-28 DIAGNOSIS — C9000 Multiple myeloma not having achieved remission: Secondary | ICD-10-CM | POA: Diagnosis not present

## 2022-08-28 DIAGNOSIS — Z556 Problems related to health literacy: Secondary | ICD-10-CM | POA: Diagnosis not present

## 2022-08-28 DIAGNOSIS — N401 Enlarged prostate with lower urinary tract symptoms: Secondary | ICD-10-CM | POA: Diagnosis not present

## 2022-08-28 DIAGNOSIS — I4891 Unspecified atrial fibrillation: Secondary | ICD-10-CM | POA: Diagnosis not present

## 2022-08-28 DIAGNOSIS — R262 Difficulty in walking, not elsewhere classified: Secondary | ICD-10-CM | POA: Diagnosis not present

## 2022-08-28 DIAGNOSIS — K219 Gastro-esophageal reflux disease without esophagitis: Secondary | ICD-10-CM | POA: Diagnosis not present

## 2022-08-28 DIAGNOSIS — R338 Other retention of urine: Secondary | ICD-10-CM | POA: Diagnosis not present

## 2022-08-28 DIAGNOSIS — Z79899 Other long term (current) drug therapy: Secondary | ICD-10-CM | POA: Diagnosis not present

## 2022-08-28 DIAGNOSIS — J9601 Acute respiratory failure with hypoxia: Secondary | ICD-10-CM | POA: Diagnosis not present

## 2022-08-28 DIAGNOSIS — I2699 Other pulmonary embolism without acute cor pulmonale: Secondary | ICD-10-CM | POA: Diagnosis not present

## 2022-08-28 DIAGNOSIS — Z7901 Long term (current) use of anticoagulants: Secondary | ICD-10-CM | POA: Diagnosis not present

## 2022-08-28 DIAGNOSIS — K5790 Diverticulosis of intestine, part unspecified, without perforation or abscess without bleeding: Secondary | ICD-10-CM | POA: Diagnosis not present

## 2022-08-29 ENCOUNTER — Ambulatory Visit: Payer: Medicare Other

## 2022-08-29 ENCOUNTER — Other Ambulatory Visit: Payer: Self-pay | Admitting: Family Medicine

## 2022-08-29 ENCOUNTER — Other Ambulatory Visit: Payer: Self-pay | Admitting: Oncology

## 2022-08-29 ENCOUNTER — Telehealth (INDEPENDENT_AMBULATORY_CARE_PROVIDER_SITE_OTHER): Payer: Medicare Other | Admitting: Family Medicine

## 2022-08-29 DIAGNOSIS — J9601 Acute respiratory failure with hypoxia: Secondary | ICD-10-CM

## 2022-08-29 DIAGNOSIS — N400 Enlarged prostate without lower urinary tract symptoms: Secondary | ICD-10-CM | POA: Diagnosis not present

## 2022-08-29 DIAGNOSIS — C9000 Multiple myeloma not having achieved remission: Secondary | ICD-10-CM | POA: Diagnosis not present

## 2022-08-29 MED ORDER — TAMSULOSIN HCL 0.4 MG PO CAPS
ORAL_CAPSULE | ORAL | 5 refills | Status: DC
Start: 2022-08-29 — End: 2023-02-17

## 2022-08-29 MED ORDER — LEVOFLOXACIN 500 MG PO TABS: 500.0000 mg | ORAL_TABLET | Freq: Every day | ORAL | 0 refills | Status: AC

## 2022-08-29 NOTE — Progress Notes (Signed)
Subjective:    Patient ID: Dean [name]., male    DOB: 08-03-1935, 87 y.o.   MRN: [id number]  HPI Patient is being seen today as a video visit.  The patient is currently at home in bed.  I am currently in my office.  He consents to be seen via video because he is too weak to come to the office.  Video visit started at 12:00.  Video visit concluded at 1220.  He was recently admitted to the hospital with history better.  CT revealed no pulmonary embolism but it did reveal possible bilateral pneumonia.  I have copied relevant portions of the discharge summary below for my reference: Admit date:     08/19/2022  Discharge date: 08/24/22  Discharge Physician: Meredeth Ide    PCP: Donita Brooks, MD    Recommendations at discharge:    Follow-up PCP in 1 week   Discharge Diagnoses: Principal Problem:   Acute hypoxic respiratory failure (HCC) Active Problems:   BPH (benign prostatic hyperplasia)   PNA (pneumonia)   Urethral bleeding   Multiple myeloma without remission (HCC)   Pulmonary embolism (HCC)   Resolved Problems:   * No resolved hospital problems. *   Hospital Course:   86 year old male with medical history of multiple myeloma, BPH, pulmonary embolism on anticoagulation came to hospital with generalized weakness, reported hypoxemia with O2 sats in 80s on room air.  Patient was placed on 2 L of oxygen via nasal cannula. UA test not reported due to interference of urine pigments. In and out Cath attempted, met with resistance, coude finally placed- with drainage of 1 L urine, but patient has been oozing blood since placement of Coude. CTA chest negative for PE, shows small bilateral pleural effusions with partial lower lobe consolidations-atelectasis or pneumonia.  Extensive skeletal lytic lesions-slightly progressed from prior CT in January. IV Vanc and cefepime given, with CT chest findings, azithromycin was added. 3 L bolus given.     Assessment and Plan:   Acute  hypoxemic respiratory failure -Resolved, no longer requiring oxygen -CTA chest negative for PE -Suggest partial lower lobe consolidation, atelectasis or pneumonia -Patient started on ceftriaxone Zithromax for pneumonia.  Will discharge on  Zithromax for 2 more days -Repeat chest x-ray yesterday showed bilateral atelectasis -Impaired coughing due to chest wall pain from fall, improved after giving oversensitive.  Will discharge on Robitussin as needed ; flutter valve every 4 hours   Chest pain -likely atypical, resolved -he fell 2 weeks ago; ? musculoskeletal -Troponin x 2 negative   Atrial fibrillation with RVR -Resolved, back to sinus rhythm -Went into A-fib with RVR this morning, started on Cardizem gtt. -Cardizem has been d/ced, started on  metoprolol 12.5 mg po bid -discussed with urology, will start him back on eliquis   BPH/hematuria -Resolved -Bladder outlet obstruction, in and out cath met resistance, subsequent coud placed with patient starting draining blood around the coud catheter -Urology was consulted -Underwent cystoscopy yesterday for clot evacuation and fulguration  -Patient to go home with Foley catheter, follow-up urology as outpatient   Anemia -Secondary to multiple myeloma -S/p 3 units PRBC -Hemoglobin is stable at 8.1     Hyperkalemia -Resolved after patient received Lokelma 10 g p.o. x 1   Hypokalemia -Resolved  Daughter helps with a history.  Patient appears extremely for fact that.  He is laying in bed and frequently appears to fall asleep during our encounter.  Daughter states that he is very congested.  They can hear rattling in his lungs.  He continues to cough and have sputum productive of brown sputum.  Patient denies any fever.  Patient denies any shortness of breath today.  He states that his breathing is improving however his daughter is concerned because he was extremely weak.  They state that he is too weak to come to the doctor's office.   After receiving 3 units of blood in the hospital his discharge hemoglobin was 8.1.  It was assumed that his anemia was due to blood loss due to clots in his bladder coupled with his multiple myeloma.  He has resumed Eliquis due to his history pulm emboli.  Have not seen any additional bleeding in his catheter.  They show me a urine sample today and it is clear and looks like apple juice.  There is no gross hematuria.  The patient denies any chest pain.  He is unable to tell if it burns when he pees as he has a catheter in place.  He is scheduled to see urology on Monday to have the catheter removed.  He denies any blood in his stools or melena. Past Medical History:  Diagnosis Date   BPH (benign prostatic hyperplasia)    Colonic diverticular abscess 01/08/2015   Diverticulitis 12/22/2014   complicated by abscess and required percutaneous drainage   GERD (gastroesophageal reflux disease)    H/O ETOH abuse    History of chemotherapy last done jan 2017   History of radiation therapy 01/05/13-02/10/13   45 gray to left occipital condyle region   History of radiation therapy 12/01/16-12/10/16   Parasternal nodule, chest- 24 Gy total delivered in 8 fractions, Left sacro-iliac, pelvis- 24 Gy total delivered in 8 fractions    History of radiation therapy 02/19/17-02/22/17   right temporal scalp 30 Gy in 10 fractions   History of radiation therapy    Right hip( Pelvis) 11/18/21-11/21/21-Dr. Antony Blackbird   Intra-abdominal abscess (HCC)    Multiple myeloma (HCC) 2015   Neuropathy    Radiation 02/21/14-03/08/14   right posterior chest wall area 30 gray   Radiation 04/19/14-05/02/14   lumbar spine 25 gray   Skull lesion    Left occipital condyle   Wrist fracture, left    x 2   Past Surgical History:  Procedure Laterality Date   COLONOSCOPY N/A 04/19/2015   Procedure: COLONOSCOPY;  Surgeon: West Bali, MD;  Location: [facility];  Service: Endoscopy;  Laterality: N/A;  1:30 PM   CYST EXCISION  1959    tail bone   CYSTOSCOPY WITH FULGERATION N/A 08/20/2022   Procedure: CYSTOSCOPY WITH FULGERATION;  Surgeon: Crista Elliot, MD;  Location: [facility];  Service: Urology;  Laterality: N/A;  cysto clot evac   IR IMAGING GUIDED PORT INSERTION  09/25/2017   Current Outpatient Medications on File Prior to Visit  Medication Sig Dispense Refill   acetaminophen (TYLENOL) 500 MG tablet Take 1,000 mg by mouth daily.     acyclovir (ZOVIRAX) 400 MG tablet Take 1 tablet (400 mg total) by mouth 2 (two) times daily. 60 tablet 6   ALPRAZolam (XANAX) 1 MG tablet TAKE ONE TABLET (1 MG TOTAL) BY MOUTH THREE (THREE) TIMES DAILY AS NEEDED. FOR ANXIETY (Patient taking differently: Take 1 mg by mouth 3 (three) times daily as needed for anxiety or sleep.) 90 tablet 0   CALCIUM-VITAMIN D PO Take 1 tablet by mouth 2 (two) times daily.      cyanocobalamin (VITAMIN B12) 500 MCG  tablet Take 1 tablet (500 mcg total) by mouth daily.     dexamethasone (DECADRON) 4 MG tablet TAKE ONE TABLET (4 MG TOTAL) BY MOUTH DAILY. (Patient taking differently: Take 4 mg by mouth every other day.) 30 tablet 0   fluticasone (FLONASE) 50 MCG/ACT nasal spray PLACE 2 SPRAYS INTO BOTH NOSTRILS DAILY 16 g 6   folic acid (FOLVITE) 1 MG tablet Take 1 tablet (1 mg total) by mouth daily. 90 tablet 1   furosemide (LASIX) 20 MG tablet Take 1 tablet (20 mg total) by mouth daily as needed. (Patient taking differently: Take 20 mg by mouth every other day.) 90 tablet 1   gabapentin (NEURONTIN) 400 MG capsule Take 1 capsule (400 mg total) by mouth 3 (three) times daily. (Patient taking differently: Take 400 mg by mouth 2 (two) times daily.) 90 capsule 4   guaiFENesin-dextromethorphan (ROBITUSSIN DM) 100-10 MG/5ML syrup Take 5 mLs by mouth every 4 (four) hours as needed for cough (chest congestion). 473 mL 0   Homeopathic Products (THERAWORX RELIEF EX) Apply topically as needed.     lidocaine-prilocaine (EMLA) cream Apply small amount over port one (1) hour  prior to appointment. 30 g 0   metoprolol tartrate (LOPRESSOR) 25 MG tablet Take 0.5 tablets (12.5 mg total) by mouth 2 (two) times daily. 30 tablet 1   montelukast (SINGULAIR) 10 MG tablet TAKE 1 TABLET BY MOUTH EVERY NIGHT AT BEDTIME 30 tablet 3   Oxycodone HCl 10 MG TABS TAKE ONE TO TWO TABLETS BY MOUTH THREE TIMES DAILY AS NEEDED (Patient taking differently: Take 1-2 tablets by mouth 3 (three) times daily. TAKE ONE TO TWO TABLETS BY MOUTH THREE TIMES DAILY AS NEEDED, Dr told Patient he could take an extra tablet if needed throughout the day.) 90 tablet 0   pomalidomide (POMALYST) 4 MG capsule Take 1 capsule (4 mg total) by mouth daily. Take for 21 days, then hold for 7 days. Repeat every 28 days. (Patient not taking: Reported on 08/25/2022) 21 capsule 0   traZODone (DESYREL) 100 MG tablet TAKE THREE TABLETS BY MOUTH EVERY NIGHT AT BEDTIME (Patient taking differently: Take 300 mg by mouth at bedtime.) 90 tablet 1   TURMERIC PO Take 1 tablet by mouth 2 (two) times daily.     Current Facility-Administered Medications on File Prior to Visit  Medication Dose Route Frequency Provider Last Rate Last Admin   heparin lock flush 100 unit/mL  500 Units Intravenous Once Orlie Dakin, Tollie Pizza, MD       Allergies  Allergen Reactions   Codeine Other (See Comments)    Headache   Morphine And Codeine Other (See Comments)    Makes him feel weird   Revlimid [Lenalidomide] Other (See Comments)    "Causes me to become weak"   Social History   Socioeconomic History   Marital status: Significant Other    Spouse name: Mary   Number of children: 3   Years of education: Not on file   Highest education level: Not on file  Occupational History    Employer: RETIRED  Tobacco Use   Smoking status: Never   Smokeless tobacco: Never  Vaping Use   Vaping status: Never Used  Substance and Sexual Activity   Alcohol use: No    Comment: " Not much no more"   Drug use: No   Sexual activity: Not on file  Other  Topics Concern   Not on file  Social History Narrative   Not on file   Social  Determinants of Health   Financial Resource Strain: Low Risk  (03/21/2022)   Overall Financial Resource Strain (CARDIA)    Difficulty of Paying Living Expenses: Not very hard  Food Insecurity: No Food Insecurity (08/25/2022)   Hunger Vital Sign    Worried About Running Out of Food in the Last Year: Never true    Ran Out of Food in the Last Year: Never true  Transportation Needs: No Transportation Needs (08/25/2022)   PRAPARE - Administrator, Civil Service (Medical): No    Lack of Transportation (Non-Medical): No  Physical Activity: Inactive (01/09/2022)   Exercise Vital Sign    Days of Exercise per Week: 0 days    Minutes of Exercise per Session: 0 min  Stress: Stress Concern Present (01/09/2022)   [employer] of Occupational Health - Occupational Stress Questionnaire    Feeling of Stress : To some extent  Social Connections: Moderately Isolated (01/09/2022)   Social Connection and Isolation Panel [NHANES]    Frequency of Communication with Friends and Family: Three times a week    Frequency of Social Gatherings with Friends and Family: Three times a week    Attends Religious Services: Never    Active Member of Clubs or Organizations: No    Attends Banker Meetings: Never    Marital Status: Living with partner  Intimate Partner Violence: Not At Risk (08/19/2022)   Humiliation, Afraid, Rape, and Kick questionnaire    Fear of Current or Ex-Partner: No    Emotionally Abused: No    Physically Abused: No    Sexually Abused: No      Review of Systems  All other systems reviewed and are negative.      Objective:   Physical Exam  Patient appears cachectic and frail and profoundly weak and currently appears to be bedbound      Assessment & Plan:   Multiple myeloma without remission (HCC)  Benign prostatic hyperplasia, unspecified whether lower urinary tract symptoms  present - Plan: tamsulosin (FLOMAX) 0.4 MG CAPS capsule  Acute respiratory failure with hypoxia (HCC) Patient denies any chest pain today or shortness of breath however he reports feeling extremely weak.  I am very concerned that his hemoglobin may be dropping again and that he may be severely anemic.  The family does not feel that they can get him to the office today for me to check his blood.  He agrees for on Monday to have the catheter removed.  I asked him to come by my office so that we can check a fingerstick hemoglobin.  We can check this with the patient sitting in the car.  If his hemoglobin is dropping we will discontinue the Eliquis and he may need another fusion.  I am concerned that his weakness.  The cough is productive of brown sputum and the family is reporting a rattling in his chest.  The rattling could be due to poor pulmonary toilet.  I encouraged him to use the flutter valve every hour.  I will also start patient on Levaquin 500 milligrams daily for 7 days to cover for possible residual pneumonia.  The other possibility of the fatigue will be an undiagnosed urinary tract infection given the indwelling Foley catheter.  Levaquin will give adequate coverage for both given this extremely complicated situation.  I am unable to check his potassium or his renal function today but this could be contributing as well.  If possible we may try to check a BMP when  he comes to the office on Monday

## 2022-08-29 NOTE — Progress Notes (Unsigned)
Name: Dean [name]. DOB: 1935-06-11 MRN: [id number]  History of Present Illness: Dean [name] is a 87 y.o. male who presents today as a new patient at [facility] [place]. All available relevant medical records have been reviewed. He is accompanied by his daughter Dean [name]. - GU History: 1. BPH with BOO.  Recent history:  Admitted 08/19/2022 - 08/24/2022 for acute hypoxic respiratory failure secondary to pneumonia and a pulmonary embolism. Of note he has multiple myeloma. During admission was found to have urinary retention requiring coude catheter placement, which was difficult. He subsequently had gross hematuria "secondary to prostatic bleeding and likely traumatic Foley" and underwent cystoscopy with clot evacuation and fulguration by Dr. Alvester Morin on 08/20/2022.  Per PCP note on 08/29/2022 patient reported "feeling extremely weak". The plan was for patient to stop by PCP office for fingerstick hemoglobin check to determine if he is anemic to the point of needing a blood transfusion. He was also prescribed Levaquin 500 mg x7 days for possible residual pneumonia / possible catheter-associated UTI.   Today: He denies history of urinary retention prior to current episode. His daughter reports this current episode all started with a nurse trying to do I&O cath to obtain a urine specimen and the subsequent bleeding / clot retention due to traumatic catheterization with patient on a blood thinner. He reports the catheter is draining well. He reports resolution of gross hematuria. Denies flank pain, abdominal pain, fevers.    Fall Screening: Do you usually have a device to assist in your mobility? Yes   Medications: Current Outpatient Medications  Medication Sig Dispense Refill   acetaminophen (TYLENOL) 500 MG tablet Take 1,000 mg by mouth daily.     acyclovir (ZOVIRAX) 400 MG tablet Take 1 tablet (400 mg total) by mouth 2 (two) times daily. 60 tablet 6   ALPRAZolam (XANAX) 1 MG tablet  TAKE ONE TABLET (1 MG TOTAL) BY MOUTH THREE (THREE) TIMES DAILY AS NEEDED. FOR ANXIETY (Patient taking differently: Take 1 mg by mouth 3 (three) times daily as needed for anxiety or sleep.) 90 tablet 0   CALCIUM-VITAMIN D PO Take 1 tablet by mouth 2 (two) times daily.      cyanocobalamin (VITAMIN B12) 500 MCG tablet Take 1 tablet (500 mcg total) by mouth daily.     dexamethasone (DECADRON) 4 MG tablet TAKE ONE TABLET (4 MG TOTAL) BY MOUTH DAILY. (Patient taking differently: Take 4 mg by mouth every other day.) 30 tablet 0   ELIQUIS 5 MG TABS tablet TAKE ONE TABLET BY MOUTH TWICE DAILY 60 tablet 1   fluticasone (FLONASE) 50 MCG/ACT nasal spray PLACE 2 SPRAYS INTO BOTH NOSTRILS DAILY 16 g 6   folic acid (FOLVITE) 1 MG tablet Take 1 tablet (1 mg total) by mouth daily. 90 tablet 1   furosemide (LASIX) 20 MG tablet Take 1 tablet (20 mg total) by mouth daily as needed. (Patient taking differently: Take 20 mg by mouth every other day.) 90 tablet 1   gabapentin (NEURONTIN) 400 MG capsule Take 1 capsule (400 mg total) by mouth 3 (three) times daily. (Patient taking differently: Take 400 mg by mouth 2 (two) times daily.) 90 capsule 4   guaiFENesin-dextromethorphan (ROBITUSSIN DM) 100-10 MG/5ML syrup Take 5 mLs by mouth every 4 (four) hours as needed for cough (chest congestion). 473 mL 0   Homeopathic Products (THERAWORX RELIEF EX) Apply topically as needed.     levofloxacin (LEVAQUIN) 500 MG tablet Take 1 tablet (500 mg total) by mouth  daily for 7 days. 7 tablet 0   lidocaine-prilocaine (EMLA) cream Apply small amount over port one (1) hour prior to appointment. 30 g 0   metoprolol tartrate (LOPRESSOR) 25 MG tablet Take 0.5 tablets (12.5 mg total) by mouth 2 (two) times daily. 30 tablet 1   montelukast (SINGULAIR) 10 MG tablet TAKE 1 TABLET BY MOUTH EVERY NIGHT AT BEDTIME 30 tablet 3   Oxycodone HCl 10 MG TABS TAKE ONE TO TWO TABLETS BY MOUTH THREE TIMES DAILY AS NEEDED (Patient taking differently: Take 1-2  tablets by mouth 3 (three) times daily. TAKE ONE TO TWO TABLETS BY MOUTH THREE TIMES DAILY AS NEEDED, Dr told Patient he could take an extra tablet if needed throughout the day.) 90 tablet 0   pomalidomide (POMALYST) 4 MG capsule Take 1 capsule (4 mg total) by mouth daily. Take for 21 days, then hold for 7 days. Repeat every 28 days. 21 capsule 0   tamsulosin (FLOMAX) 0.4 MG CAPS capsule TAKE ONE CAPSULE BY MOUTH EVERY NIGHT ATBEDTIME 30 capsule 5   traZODone (DESYREL) 100 MG tablet TAKE THREE TABLETS BY MOUTH EVERY NIGHT AT BEDTIME (Patient taking differently: Take 300 mg by mouth at bedtime.) 90 tablet 1   TURMERIC PO Take 1 tablet by mouth 2 (two) times daily.     No current facility-administered medications for this visit.   Facility-Administered Medications Ordered in Other Visits  Medication Dose Route Frequency Provider Last Rate Last Admin   heparin lock flush 100 unit/mL  500 Units Intravenous Once Jeralyn Ruths, MD        Allergies: Allergies  Allergen Reactions   Codeine Other (See Comments)    Headache   Morphine And Codeine Other (See Comments)    Makes him feel weird   Revlimid [Lenalidomide] Other (See Comments)    "Causes me to become weak"    Past Medical History:  Diagnosis Date   BPH (benign prostatic hyperplasia)    Colonic diverticular abscess 01/08/2015   Diverticulitis 12/22/2014   complicated by abscess and required percutaneous drainage   GERD (gastroesophageal reflux disease)    H/O ETOH abuse    History of chemotherapy last done jan 2017   History of radiation therapy 01/05/13-02/10/13   45 gray to left occipital condyle region   History of radiation therapy 12/01/16-12/10/16   Parasternal nodule, chest- 24 Gy total delivered in 8 fractions, Left sacro-iliac, pelvis- 24 Gy total delivered in 8 fractions    History of radiation therapy 02/19/17-02/22/17   right temporal scalp 30 Gy in 10 fractions   History of radiation therapy    Right hip(  Pelvis) 11/18/21-11/21/21-Dr. Antony Blackbird   Intra-abdominal abscess (HCC)    Multiple myeloma (HCC) 2015   Neuropathy    Radiation 02/21/14-03/08/14   right posterior chest wall area 30 gray   Radiation 04/19/14-05/02/14   lumbar spine 25 gray   Skull lesion    Left occipital condyle   Wrist fracture, left    x 2   Past Surgical History:  Procedure Laterality Date   COLONOSCOPY N/A 04/19/2015   Procedure: COLONOSCOPY;  Surgeon: West Bali, MD;  Location: [facility];  Service: Endoscopy;  Laterality: N/A;  1:30 PM   CYST EXCISION  1959   tail bone   CYSTOSCOPY WITH FULGERATION N/A 08/20/2022   Procedure: CYSTOSCOPY WITH FULGERATION;  Surgeon: Crista Elliot, MD;  Location: [facility];  Service: Urology;  Laterality: N/A;  cysto clot evac   IR IMAGING  GUIDED PORT INSERTION  09/25/2017   Family History  Problem Relation Age of Onset   Diabetes Mother    Stroke Mother    Hypertension Father    Social History   Socioeconomic History   Marital status: Significant Other    Spouse name: Mary   Number of children: 3   Years of education: Not on file   Highest education level: Not on file  Occupational History    Employer: RETIRED  Tobacco Use   Smoking status: Never   Smokeless tobacco: Never  Vaping Use   Vaping status: Never Used  Substance and Sexual Activity   Alcohol use: No    Comment: " Not much no more"   Drug use: No   Sexual activity: Not on file  Other Topics Concern   Not on file  Social History Narrative   Not on file   Social Determinants of Health   Financial Resource Strain: Low Risk  (03/21/2022)   Overall Financial Resource Strain (CARDIA)    Difficulty of Paying Living Expenses: Not very hard  Food Insecurity: No Food Insecurity (08/25/2022)   Hunger Vital Sign    Worried About Running Out of Food in the Last Year: Never true    Ran Out of Food in the Last Year: Never true  Transportation Needs: No Transportation Needs (08/25/2022)   PRAPARE -  Administrator, Civil Service (Medical): No    Lack of Transportation (Non-Medical): No  Physical Activity: Inactive (01/09/2022)   Exercise Vital Sign    Days of Exercise per Week: 0 days    Minutes of Exercise per Session: 0 min  Stress: Stress Concern Present (01/09/2022)   [employer] of Occupational Health - Occupational Stress Questionnaire    Feeling of Stress : To some extent  Social Connections: Moderately Isolated (01/09/2022)   Social Connection and Isolation Panel [NHANES]    Frequency of Communication with Friends and Family: Three times a week    Frequency of Social Gatherings with Friends and Family: Three times a week    Attends Religious Services: Never    Active Member of Clubs or Organizations: No    Attends Banker Meetings: Never    Marital Status: Living with partner  Intimate Partner Violence: Not At Risk (08/19/2022)   Humiliation, Afraid, Rape, and Kick questionnaire    Fear of Current or Ex-Partner: No    Emotionally Abused: No    Physically Abused: No    Sexually Abused: No    SUBJECTIVE  Review of Systems Constitutional: Patient denies any unintentional weight loss or change in strength lntegumentary: Patient denies any rashes or pruritus Cardiovascular: Patient denies chest pain or syncope Respiratory: Patient denies shortness of breath Gastrointestinal: Patient denies nausea, vomiting, constipation, or diarrhea Musculoskeletal: Patient reports weakness Neurologic: Patient denies convulsions or seizures Allergic/Immunologic: Patient denies recent allergic reaction(s) Hematologic/Lymphatic: Patient denies bleeding tendencies Endocrine: Patient denies heat/cold intolerance  GU: As per HPI.  OBJECTIVE Vitals:   09/01/22 1038 09/01/22 1058  BP: (!) 57/40 (!) 90/58  Pulse: 90 91  Temp: 98.8 F (37.1 C)    There is no height or weight on file to calculate BMI.  Physical Examination  Constitutional: No obvious  distress; patient is non-toxic appearing  Cardiovascular: No visible lower extremity edema.  Respiratory: The patient does not have audible wheezing/stridor; respirations do not appear labored  Gastrointestinal: Abdomen non-distended Musculoskeletal: Normal ROM of UEs  Skin: No obvious rashes/open sores  Neurologic: CN 2-12  grossly intact Psychiatric: Answered questions appropriately with normal affect  Hematologic/Lymphatic/Immunologic: No obvious bruises or sites of spontaneous bleeding   ASSESSMENT Urinary retention - Plan: Bladder Voiding Trial, DISCONTINUED: ciprofloxacin (CIPRO) tablet 500 mg  Benign prostatic hyperplasia with urinary retention  Urethral bleeding  Hospital discharge follow-up  Foley catheter in place  We discussed finding of acute urinary retention. Possible etiologies may include temporary detrusor areflexia, neurogenic bladder, BPH/BOO, constipation, anticholinergic medication use, high-tone pelvic floor dysfunction, voiding dyssynergia.   Voiding trial was done and pt was able to successfully empty bladder of the contents instilled. Foley catheter discontinued. Discussion was had about increasing water intake for the next few day to insure good voiding. Pt advised to return if unable to void, or go to ER if after clinic hours.  OK to complete Levaquin as prescribed by PCP for pneumonia; low suspicion for UTI at this time.   We agreed to follow up on an as-needed basis. Pt verbalized understanding and agreement. All questions were answered.  PLAN Advised the following: Foley catheter discontinued.  Continue Flomax daily as prescribed elsewhere. Return if symptoms worsen or fail to improve.  Orders Placed This Encounter  Procedures   Bladder Voiding Trial   Total time spent caring for the patient today was over 45 minutes. This includes time spent on the date of the visit reviewing the patient's chart before the visit, time spent during the visit, and  time spent after the visit on documentation. Over 50% of that time was spent in face-to-face time with this patient for direct counseling. E&M based on time and complexity of medical decision making.  It has been explained that the patient is to follow regularly with their PCP in addition to all other providers involved in their care and to follow instructions provided by these respective offices. Patient advised to contact urology clinic if any urologic-pertaining questions, concerns, new symptoms or problems arise in the interim period.  There are no Patient Instructions on file for this visit.  Electronically signed by:  Donnita Falls, MSN, FNP-C, CUNP 09/01/2022 12:19 PM

## 2022-09-01 ENCOUNTER — Ambulatory Visit: Payer: Medicare Other | Admitting: Urology

## 2022-09-01 ENCOUNTER — Telehealth: Payer: Self-pay | Admitting: Family Medicine

## 2022-09-01 ENCOUNTER — Ambulatory Visit: Payer: Medicare Other

## 2022-09-01 ENCOUNTER — Other Ambulatory Visit: Payer: Self-pay | Admitting: *Deleted

## 2022-09-01 ENCOUNTER — Other Ambulatory Visit: Payer: Medicare Other

## 2022-09-01 ENCOUNTER — Encounter: Payer: Self-pay | Admitting: Urology

## 2022-09-01 VITALS — BP 90/58 | HR 91 | Temp 98.8°F

## 2022-09-01 DIAGNOSIS — C9000 Multiple myeloma not having achieved remission: Secondary | ICD-10-CM

## 2022-09-01 DIAGNOSIS — D696 Thrombocytopenia, unspecified: Secondary | ICD-10-CM | POA: Diagnosis not present

## 2022-09-01 DIAGNOSIS — N368 Other specified disorders of urethra: Secondary | ICD-10-CM | POA: Diagnosis not present

## 2022-09-01 DIAGNOSIS — N401 Enlarged prostate with lower urinary tract symptoms: Secondary | ICD-10-CM

## 2022-09-01 DIAGNOSIS — D649 Anemia, unspecified: Secondary | ICD-10-CM | POA: Diagnosis not present

## 2022-09-01 DIAGNOSIS — R339 Retention of urine, unspecified: Secondary | ICD-10-CM

## 2022-09-01 DIAGNOSIS — Z09 Encounter for follow-up examination after completed treatment for conditions other than malignant neoplasm: Secondary | ICD-10-CM

## 2022-09-01 DIAGNOSIS — Z978 Presence of other specified devices: Secondary | ICD-10-CM

## 2022-09-01 LAB — HEMOGLOBIN, FINGERSTICK: POC HEMOGLOBIN: 8.8 g/dL — ABNORMAL LOW (ref 13.0–17.0)

## 2022-09-01 MED ORDER — CIPROFLOXACIN HCL 500 MG PO TABS
500.0000 mg | ORAL_TABLET | Freq: Once | ORAL | Status: DC
Start: 2022-09-01 — End: 2022-09-01

## 2022-09-01 NOTE — Telephone Encounter (Signed)
Retrieved voicemail message from physical therapist at [facility]. Requesting verbal orders as follows:  1 week x1 2 weeks x3 1 week x5  Also needs occupational therapy referral faxed to [phone].  Please advise at [phone].

## 2022-09-01 NOTE — Telephone Encounter (Signed)
Pharmacy sent request for Dean [name] to refill pt's decadron and oxycodone refill.  Of note, looks like pt ws just prescribed levaquin on 7/26 x 7 days for pneumonia. He saw family provider in [place] for acute resp failure with hypoxia, weakness. He had foley cath removed today in urology.  Hgb today was 8.8. pt Recently dx from hospital. His next f/u with lab check and Dean [name] in pharmacy is not until 09/09/22. I do not see any direct follow-up with Dr. Orlie Dakin. Telephone apt with Sharia Reeve is on 8/26.  Given his acute health concerns, Dean [name]/ Dr. Orlie Dakin. Should we schedule a sooner apt with for follow-up. Please advise.

## 2022-09-01 NOTE — Progress Notes (Signed)
Fill and Pull Catheter Removal  Patient is present today for a catheter removal.  Patient was cleaned and prepped in a sterile fashion of sterile water/ saline was instilled into the bladder when the patient felt the urge to urinate. 30ml of water was then drained from the balloon.  A 24FR foley cath was removed from the bladder no complications were noted .  Patient as then given some time to void on their own.  Patient can void  on their own after some time.  Patient tolerated well.  Performed by: Kawan Valladolid LPN  Follow up/ Additional notes: per FNP note

## 2022-09-01 NOTE — Telephone Encounter (Signed)
Daughter called in to cancel patients radiation treatment for this week. Patient continues to have weakness. Dr. Roselind Messier and Linac made aware.

## 2022-09-02 ENCOUNTER — Ambulatory Visit: Payer: Medicare Other

## 2022-09-02 ENCOUNTER — Encounter (HOSPITAL_COMMUNITY): Payer: Self-pay | Admitting: Oncology

## 2022-09-02 DIAGNOSIS — J189 Pneumonia, unspecified organism: Secondary | ICD-10-CM | POA: Diagnosis not present

## 2022-09-02 DIAGNOSIS — J9601 Acute respiratory failure with hypoxia: Secondary | ICD-10-CM | POA: Diagnosis not present

## 2022-09-02 DIAGNOSIS — C9000 Multiple myeloma not having achieved remission: Secondary | ICD-10-CM | POA: Diagnosis not present

## 2022-09-02 DIAGNOSIS — N401 Enlarged prostate with lower urinary tract symptoms: Secondary | ICD-10-CM | POA: Diagnosis not present

## 2022-09-02 DIAGNOSIS — R338 Other retention of urine: Secondary | ICD-10-CM | POA: Diagnosis not present

## 2022-09-02 DIAGNOSIS — D630 Anemia in neoplastic disease: Secondary | ICD-10-CM | POA: Diagnosis not present

## 2022-09-02 MED ORDER — DEXAMETHASONE 4 MG PO TABS: 4.0000 mg | ORAL_TABLET | ORAL | 1 refills | Status: DC

## 2022-09-02 MED ORDER — OXYCODONE HCL 10 MG PO TABS
ORAL_TABLET | ORAL | 0 refills | Status: DC
Start: 2022-09-02 — End: 2022-09-25

## 2022-09-03 ENCOUNTER — Ambulatory Visit: Payer: Medicare Other

## 2022-09-03 DIAGNOSIS — N401 Enlarged prostate with lower urinary tract symptoms: Secondary | ICD-10-CM | POA: Diagnosis not present

## 2022-09-03 DIAGNOSIS — J9601 Acute respiratory failure with hypoxia: Secondary | ICD-10-CM | POA: Diagnosis not present

## 2022-09-03 DIAGNOSIS — J189 Pneumonia, unspecified organism: Secondary | ICD-10-CM | POA: Diagnosis not present

## 2022-09-03 DIAGNOSIS — D630 Anemia in neoplastic disease: Secondary | ICD-10-CM | POA: Diagnosis not present

## 2022-09-03 DIAGNOSIS — R338 Other retention of urine: Secondary | ICD-10-CM | POA: Diagnosis not present

## 2022-09-03 DIAGNOSIS — C9000 Multiple myeloma not having achieved remission: Secondary | ICD-10-CM | POA: Diagnosis not present

## 2022-09-04 ENCOUNTER — Ambulatory Visit: Payer: Medicare Other | Admitting: Pharmacist

## 2022-09-04 ENCOUNTER — Ambulatory Visit: Payer: Medicare Other

## 2022-09-04 ENCOUNTER — Other Ambulatory Visit: Payer: Self-pay | Admitting: Oncology

## 2022-09-04 ENCOUNTER — Ambulatory Visit: Payer: Medicare Other | Admitting: Oncology

## 2022-09-04 ENCOUNTER — Other Ambulatory Visit: Payer: Medicare Other

## 2022-09-04 DIAGNOSIS — C9000 Multiple myeloma not having achieved remission: Secondary | ICD-10-CM

## 2022-09-05 ENCOUNTER — Ambulatory Visit: Payer: Medicare Other

## 2022-09-05 DIAGNOSIS — J9601 Acute respiratory failure with hypoxia: Secondary | ICD-10-CM | POA: Diagnosis not present

## 2022-09-05 DIAGNOSIS — D630 Anemia in neoplastic disease: Secondary | ICD-10-CM | POA: Diagnosis not present

## 2022-09-05 DIAGNOSIS — C9000 Multiple myeloma not having achieved remission: Secondary | ICD-10-CM | POA: Diagnosis not present

## 2022-09-05 DIAGNOSIS — R338 Other retention of urine: Secondary | ICD-10-CM | POA: Diagnosis not present

## 2022-09-05 DIAGNOSIS — N401 Enlarged prostate with lower urinary tract symptoms: Secondary | ICD-10-CM | POA: Diagnosis not present

## 2022-09-05 DIAGNOSIS — J189 Pneumonia, unspecified organism: Secondary | ICD-10-CM | POA: Diagnosis not present

## 2022-09-08 ENCOUNTER — Ambulatory Visit: Payer: Medicare Other

## 2022-09-08 ENCOUNTER — Other Ambulatory Visit: Payer: Self-pay

## 2022-09-08 ENCOUNTER — Other Ambulatory Visit: Payer: Self-pay | Admitting: Oncology

## 2022-09-08 DIAGNOSIS — J9601 Acute respiratory failure with hypoxia: Secondary | ICD-10-CM | POA: Diagnosis not present

## 2022-09-08 DIAGNOSIS — D630 Anemia in neoplastic disease: Secondary | ICD-10-CM | POA: Diagnosis not present

## 2022-09-08 DIAGNOSIS — R338 Other retention of urine: Secondary | ICD-10-CM | POA: Diagnosis not present

## 2022-09-08 DIAGNOSIS — J189 Pneumonia, unspecified organism: Secondary | ICD-10-CM | POA: Diagnosis not present

## 2022-09-08 DIAGNOSIS — C9000 Multiple myeloma not having achieved remission: Secondary | ICD-10-CM

## 2022-09-08 DIAGNOSIS — N401 Enlarged prostate with lower urinary tract symptoms: Secondary | ICD-10-CM | POA: Diagnosis not present

## 2022-09-08 NOTE — Telephone Encounter (Signed)
Pt to RTC tomorrow 09/09/22, currently treatment on hold. Dose may change based on patient's status. Refusing refill for now.

## 2022-09-09 ENCOUNTER — Inpatient Hospital Stay: Payer: Medicare Other | Admitting: Pharmacist

## 2022-09-09 ENCOUNTER — Encounter: Payer: Self-pay | Admitting: Pharmacist

## 2022-09-09 ENCOUNTER — Inpatient Hospital Stay: Payer: Medicare Other | Attending: Oncology

## 2022-09-09 ENCOUNTER — Other Ambulatory Visit: Payer: Medicare Other

## 2022-09-09 ENCOUNTER — Ambulatory Visit: Payer: Medicare Other

## 2022-09-09 ENCOUNTER — Inpatient Hospital Stay: Payer: Medicare Other

## 2022-09-09 ENCOUNTER — Ambulatory Visit: Payer: Medicare Other | Admitting: Pharmacist

## 2022-09-09 VITALS — BP 91/59 | HR 99 | Resp 20 | Wt 188.1 lb

## 2022-09-09 DIAGNOSIS — G629 Polyneuropathy, unspecified: Secondary | ICD-10-CM | POA: Diagnosis not present

## 2022-09-09 DIAGNOSIS — C9000 Multiple myeloma not having achieved remission: Secondary | ICD-10-CM | POA: Diagnosis not present

## 2022-09-09 DIAGNOSIS — M2550 Pain in unspecified joint: Secondary | ICD-10-CM | POA: Diagnosis not present

## 2022-09-09 DIAGNOSIS — Z7901 Long term (current) use of anticoagulants: Secondary | ICD-10-CM | POA: Diagnosis not present

## 2022-09-09 DIAGNOSIS — F419 Anxiety disorder, unspecified: Secondary | ICD-10-CM | POA: Insufficient documentation

## 2022-09-09 DIAGNOSIS — G4700 Insomnia, unspecified: Secondary | ICD-10-CM | POA: Insufficient documentation

## 2022-09-09 DIAGNOSIS — M549 Dorsalgia, unspecified: Secondary | ICD-10-CM | POA: Insufficient documentation

## 2022-09-09 DIAGNOSIS — I2699 Other pulmonary embolism without acute cor pulmonale: Secondary | ICD-10-CM | POA: Diagnosis not present

## 2022-09-09 DIAGNOSIS — D649 Anemia, unspecified: Secondary | ICD-10-CM | POA: Insufficient documentation

## 2022-09-09 LAB — SAMPLE TO BLOOD BANK

## 2022-09-09 LAB — COMPREHENSIVE METABOLIC PANEL
ALT: 9 U/L (ref 0–44)
AST: 14 U/L — ABNORMAL LOW (ref 15–41)
Albumin: 2.3 g/dL — ABNORMAL LOW (ref 3.5–5.0)
Alkaline Phosphatase: 32 U/L — ABNORMAL LOW (ref 38–126)
Anion gap: 4 — ABNORMAL LOW (ref 5–15)
BUN: 33 mg/dL — ABNORMAL HIGH (ref 8–23)
CO2: 27 mmol/L (ref 22–32)
Calcium: 7.9 mg/dL — ABNORMAL LOW (ref 8.9–10.3)
Chloride: 107 mmol/L (ref 98–111)
Creatinine, Ser: 0.8 mg/dL (ref 0.61–1.24)
GFR, Estimated: >60 mL/min (ref >60)
Glucose, Bld: 119 mg/dL — ABNORMAL HIGH (ref 70–99)
Potassium: 3.5 mmol/L (ref 3.5–5.1)
Sodium: 138 mmol/L (ref 135–145)
Total Bilirubin: 0.4 mg/dL (ref 0.3–1.2)
Total Protein: 6 g/dL — ABNORMAL LOW (ref 6.5–8.1)

## 2022-09-09 LAB — CBC WITH DIFFERENTIAL/PLATELET
Abs Immature Granulocytes: 0.13 10*3/uL — ABNORMAL HIGH (ref 0.00–0.07)
Basophils Absolute: 0 10*3/uL (ref 0.0–0.1)
Basophils Relative: 0 %
Eosinophils Absolute: 0 10*3/uL (ref 0.0–0.5)
Eosinophils Relative: 0 %
HCT: 32.7 % — ABNORMAL LOW (ref 39.0–52.0)
Hemoglobin: 10 g/dL — ABNORMAL LOW (ref 13.0–17.0)
Immature Granulocytes: 2 %
Lymphocytes Relative: 17 %
Lymphs Abs: 1.4 10*3/uL (ref 0.7–4.0)
MCH: 29.8 pg (ref 26.0–34.0)
MCHC: 30.6 g/dL (ref 30.0–36.0)
MCV: 97.3 fL (ref 80.0–100.0)
Monocytes Absolute: 0.6 10*3/uL (ref 0.1–1.0)
Monocytes Relative: 8 %
Neutro Abs: 6 10*3/uL (ref 1.7–7.7)
Neutrophils Relative %: 73 %
Platelets: 234 10*3/uL (ref 150–400)
RBC: 3.36 MIL/uL — ABNORMAL LOW (ref 4.22–5.81)
RDW: 18.8 % — ABNORMAL HIGH (ref 11.5–15.5)
WBC: 8.2 10*3/uL (ref 4.0–10.5)
nRBC: 0 % (ref 0.0–0.2)

## 2022-09-09 MED ORDER — HEPARIN SOD (PORK) LOCK FLUSH 100 UNIT/ML IV SOLN
500.0000 [IU] | Freq: Once | INTRAVENOUS | Status: AC | PRN
  Administered 2022-09-09: 500 [IU]
  Filled 2022-09-09: qty 5

## 2022-09-09 MED ORDER — ZOLEDRONIC ACID 4 MG/100ML IV SOLN
4.0000 mg | Freq: Once | INTRAVENOUS | Status: AC
  Administered 2022-09-09: 4 mg via INTRAVENOUS
  Filled 2022-09-09: qty 100

## 2022-09-09 MED ORDER — SODIUM CHLORIDE 0.9 % IV SOLN
Freq: Once | INTRAVENOUS | Status: AC
  Filled 2022-09-09: qty 250

## 2022-09-09 NOTE — Patient Instructions (Signed)

## 2022-09-09 NOTE — Progress Notes (Signed)
Oral Chemotherapy [facility]  [phone] [phone]  Patient Care Team: Donita Brooks, MD as PCP - General (Family Medicine) Antony Blackbird, MD as Consulting Physician (Radiation Oncology) West Bali, MD (Inactive) as Consulting Physician (Gastroenterology) Karie Soda, MD as Consulting Physician (General Surgery) Jeralyn Ruths, MD as Consulting Physician (Oncology)   Name of the patient: Dean [name]  [id number]  29-Dec-1935   Date of visit: 09/09/22  HPI: Patient is a 87 y.o. male with multiple myeloma, currently treated with pomalidomide. Started in December 2023.  Reason for Consult: Oral chemotherapy follow-up for pomalidomide therapy.   PAST MEDICAL HISTORY: Past Medical History:  Diagnosis Date   BPH (benign prostatic hyperplasia)    Colonic diverticular abscess 01/08/2015   Diverticulitis 12/22/2014   complicated by abscess and required percutaneous drainage   GERD (gastroesophageal reflux disease)    H/O ETOH abuse    History of chemotherapy last done jan 2017   History of radiation therapy 01/05/13-02/10/13   45 gray to left occipital condyle region   History of radiation therapy 12/01/16-12/10/16   Parasternal nodule, chest- 24 Gy total delivered in 8 fractions, Left sacro-iliac, pelvis- 24 Gy total delivered in 8 fractions    History of radiation therapy 02/19/17-02/22/17   right temporal scalp 30 Gy in 10 fractions   History of radiation therapy    Right hip( Pelvis) 11/18/21-11/21/21-Dr. Antony Blackbird   Intra-abdominal abscess [facility])    Multiple myeloma (HCC) 2015   Neuropathy    Radiation 02/21/14-03/08/14   right posterior chest wall area 30 gray   Radiation 04/19/14-05/02/14   lumbar spine 25 gray   Skull lesion    Left occipital condyle   Wrist fracture, left    x 2    HEMATOLOGY/ONCOLOGY HISTORY:  Oncology History  Multiple myeloma without remission (HCC)  11/16/2012 Initial Diagnosis   L  occipital condyle destructive lesion, not biopsied secondary to location. XRT   12/01/2012 Imaging   L3 superior endplate compression FX, no lytic lesions to suggest myeloma   02/06/2014 Imaging   soft tissue mass along the right posterior fifth rib with some rib destruction   02/16/2014 Miscellaneous   Normal CBC, Normal CMP, kappa,lamda ratio of 2.62 (abnormal), UIEP with slightly restricted band, IgG kappa, SPEP/IEP with monoclonal protein at 0.79 g/dl, normal igG, suppressed IGM   02/20/2014 Bone Marrow Biopsy   33% kappa restricted plasma cells Normal FISH, normal cytogenetics   02/21/2014 - 03/08/2014 Radiation Therapy   30Gy to R rib lesion, lesion not biopsied   03/16/2014 PET scan   Scattered hypermetabolic osseous lesions, including the calvarium, ribs, left scapula, sacrum, and left femoral shaft. An expansile left vertebral body lesion at L3 extends into the epidural space of the left lateral recess,    03/17/2014 - 04/07/2014 Chemotherapy   Velcade and Dexamethasone due to initial denial of Revlimid by insurance company.  Zometa monthly.   03/22/2014 Imaging   MR_ L-Spine- Enhancing lesions compatible with multiple myeloma at L2, L3, and S1-2.   04/07/2014 - 07/28/2014 Chemotherapy   RVD with Revlimid at 25 mg days 1-14.  Revlimid dose reduced to 25 mg every other day x 14 days with 7 day respite beginning on day 1 of cycle 2.   04/17/2014 Adverse Reaction   Revlimid-induced rash.  Treated with steroids.   07/28/2014 - 08/04/2014 Chemotherapy   Revlimid daily   08/04/2014 Adverse Reaction   Velcade-induced peripheral neuropathy   08/04/2014 Treatment Plan Change  D/C Velcade   08/11/2014 PET scan   Response to therapy. Improvement and resolution of foci of osseous hypermetabolism.   08/22/2014 - 08/28/2014 Chemotherapy   Revlimid 25 mg days 1-21 every 28 days   08/28/2014 Adverse Reaction   Revlimid-induced rash. Medication held.  Medrol dose Pak prescribed.   09/04/2014 - 01/08/2015  Chemotherapy   Revlimid 10 mg every other day, without dexamethasone   01/08/2015 - 01/15/2015 Hospital Admission   Colonic diverticular abscess with IR drain placement   03/02/2015 PET scan   Only bony uptake is low activity at site of deformity/callus at R second rib FX, high activity in sigmoid colon, advanced sigmoid divertidulosis. Abscess not resolved?   06/04/2015 Imaging   CT nonobstructive L renal calculus, diverticulosis of descending and sigmoid colon without inflammation, moderate prostatic enlargement   07/12/2015 Surgery   Diverticulitis s/p robotic sigmoid colectomy with Dr. Michaell Cowing   08/23/2015 PET scan   Primarily similar hypermetabolic osseous foci within the R sided ribs. new L third rib focus of hypermetab and sclerosis is favored to be related to healing FX. no soft tissue myeloma id'd. Presumed postop hypermetab and edema about the R pelvic wall    03/06/2016 PET scan   1. Reduced activity in the prior lesion such as the right second and fifth rib lesions, activity nearly completely resolved and significantly less than added mediastinal blood pool activity. No new lesions are identified. 2. Other imaging findings of potential clinical significance: Coronary, aortic arch, and branch vessel atherosclerotic vascular disease. Aortoiliac atherosclerotic vascular disease. Enlarged prostate gland. Colonic diverticula.   02/17/2017 PET scan   HEAD/NECK: No hypermetabolic activity in the scalp. No hypermetabolic cervical lymph nodes. CHEST: No hypermetabolic mediastinal or hilar nodes. Right upper lobe scarring/atelectasis. No suspicious pulmonary nodules on the CT scan. ABDOMEN/PELVIS: No abnormal hypermetabolic activity within the liver, pancreas, adrenal glands, or spleen. No hypermetabolic lymph nodes in the abdomen or pelvis. Atherosclerotic calcifications of the abdominal aorta and branch vessels. Left renal sinus cysts. Sigmoid diverticulosis, without evidence of  diverticulitis. Prostatomegaly. SKELETON: Vague hypermetabolism involving the inferior sternum, max SUV 3.2, previously 8.2. Focal hypermetabolism involving the left sacrum, max SUV 4.0, previously 6.7. EXTREMITIES: No abnormal hypermetabolic activity in the lower extremities.   02/18/2017 -  Chemotherapy   Bortezomib 1.3mg /m2 QWk + Dexamethasone 10mg  QTue/Fri --Cycle #1, 02/18/17    02/18/2017 - 08/26/2017 Chemotherapy   The patient had bortezomib SQ (VELCADE) chemo injection 3 mg, 1.3 mg/m2 = 3 mg, Subcutaneous,  Once, 7 of 9 cycles Administration: 3 mg (02/18/2017), 3 mg (02/26/2017), 3 mg (03/04/2017), 3 mg (03/11/2017), 3 mg (04/08/2017), 3 mg (03/18/2017), 3 mg (03/25/2017), 3 mg (04/01/2017), 3 mg (05/06/2017), 3 mg (05/20/2017), 3 mg (06/03/2017), 3 mg (06/17/2017), 3 mg (07/01/2017), 3 mg (07/15/2017), 3 mg (07/29/2017), 3 mg (08/12/2017), 3 mg (08/26/2017)  for chemotherapy treatment.    09/17/2017 - 10/02/2021 Chemotherapy   Patient is on Treatment Plan : MYELOMA Daratumumab q28d     09/17/2017 - 12/04/2021 Chemotherapy   Patient is on Treatment Plan : MYELOMA Daratumumab SQ q28d       ALLERGIES:  is allergic to codeine, morphine and codeine, and revlimid [lenalidomide].  MEDICATIONS:  Current Outpatient Medications  Medication Sig Dispense Refill   acetaminophen (TYLENOL) 500 MG tablet Take 1,000 mg by mouth daily.     acyclovir (ZOVIRAX) 400 MG tablet Take 1 tablet (400 mg total) by mouth 2 (two) times daily. 60 tablet 6   ALPRAZolam (XANAX) 1 MG tablet  TAKE ONE TABLET (1 MG TOTAL) BY MOUTH THREE (THREE) TIMES DAILY AS NEEDED. FOR ANXIETY (Patient taking differently: Take 1 mg by mouth 3 (three) times daily as needed for anxiety or sleep.) 90 tablet 0   CALCIUM-VITAMIN D PO Take 1 tablet by mouth 2 (two) times daily.      cyanocobalamin (VITAMIN B12) 500 MCG tablet Take 1 tablet (500 mcg total) by mouth daily.     dexamethasone (DECADRON) 4 MG tablet Take 1 tablet (4 mg total) by mouth every other  day. 90 tablet 1   ELIQUIS 5 MG TABS tablet TAKE ONE TABLET BY MOUTH TWICE DAILY 60 tablet 1   fluticasone (FLONASE) 50 MCG/ACT nasal spray PLACE 2 SPRAYS INTO BOTH NOSTRILS DAILY 16 g 6   folic acid (FOLVITE) 1 MG tablet Take 1 tablet (1 mg total) by mouth daily. 90 tablet 1   furosemide (LASIX) 20 MG tablet Take 1 tablet (20 mg total) by mouth daily as needed. (Patient taking differently: Take 20 mg by mouth every other day.) 90 tablet 1   gabapentin (NEURONTIN) 400 MG capsule Take 1 capsule (400 mg total) by mouth 3 (three) times daily. (Patient taking differently: Take 400 mg by mouth 2 (two) times daily.) 90 capsule 4   guaiFENesin-dextromethorphan (ROBITUSSIN DM) 100-10 MG/5ML syrup Take 5 mLs by mouth every 4 (four) hours as needed for cough (chest congestion). 473 mL 0   Homeopathic Products (THERAWORX RELIEF EX) Apply topically as needed.     lidocaine-prilocaine (EMLA) cream Apply small amount over port one (1) hour prior to appointment. 30 g 0   metoprolol tartrate (LOPRESSOR) 25 MG tablet Take 0.5 tablets (12.5 mg total) by mouth 2 (two) times daily. 30 tablet 1   montelukast (SINGULAIR) 10 MG tablet TAKE 1 TABLET BY MOUTH EVERY NIGHT AT BEDTIME 30 tablet 3   Oxycodone HCl 10 MG TABS TAKE ONE TO TWO TABLETS BY MOUTH THREE TIMES DAILY AS NEEDED 90 tablet 0   pomalidomide (POMALYST) 4 MG capsule Take 1 capsule (4 mg total) by mouth daily. Take for 21 days, then hold for 7 days. Repeat every 28 days. 21 capsule 0   tamsulosin (FLOMAX) 0.4 MG CAPS capsule TAKE ONE CAPSULE BY MOUTH EVERY NIGHT ATBEDTIME 30 capsule 5   traZODone (DESYREL) 100 MG tablet TAKE THREE TABLETS BY MOUTH EVERY NIGHT AT BEDTIME (Patient taking differently: Take 300 mg by mouth at bedtime.) 90 tablet 1   TURMERIC PO Take 1 tablet by mouth 2 (two) times daily.     No current facility-administered medications for this visit.   Facility-Administered Medications Ordered in Other Visits  Medication Dose Route Frequency  Provider Last Rate Last Admin   heparin lock flush 100 unit/mL  500 Units Intravenous Once Jeralyn Ruths, MD        VITAL SIGNS: There were no vitals taken for this visit. There were no vitals filed for this visit.  Estimated body mass index is 29.16 kg/m as calculated from the following:   Height as of 08/19/22: 5\' 8"  (1.727 m).   Weight as of 08/19/22: 87 kg (191 lb 12.8 oz).  LABS: CBC:    Component Value Date/Time   WBC 7.8 08/24/2022 0322   HGB 8.3 (L) 08/24/2022 0322   HCT 26.3 (L) 08/24/2022 0322   PLT 147 (L) 08/24/2022 0322   MCV 96.0 08/24/2022 0322   NEUTROABS 2.7 08/14/2022 0905   LYMPHSABS 0.5 (L) 08/14/2022 0905   MONOABS 0.8 08/14/2022 0905  EOSABS 0.0 08/14/2022 0905   BASOSABS 0.1 08/14/2022 0905   Comprehensive Metabolic Panel:    Component Value Date/Time   NA 132 (L) 08/24/2022 0322   K 3.8 08/24/2022 0322   CL 105 08/24/2022 0322   CO2 22 08/24/2022 0322   BUN 17 08/24/2022 0322   CREATININE 0.60 (L) 08/24/2022 0322   CREATININE 0.51 (L) 03/20/2022 0914   CREATININE 0.81 03/26/2021 1117   GLUCOSE 123 (H) 08/24/2022 0322   CALCIUM 6.8 (L) 08/24/2022 0322   AST 11 (L) 08/24/2022 0322   AST 18 03/20/2022 0914   ALT 13 08/24/2022 0322   ALT 13 03/20/2022 0914   ALKPHOS 20 (L) 08/24/2022 0322   BILITOT 0.5 08/24/2022 0322   BILITOT 0.4 03/20/2022 0914   PROT 5.8 (L) 08/24/2022 0322   ALBUMIN 1.8 (L) 08/24/2022 0322     Present during today's visit: patient and his daughter  Assessment and Plan: Pomalidomide has been on hold since recent hospitalization (pneumonia),  continue to hold pomalidomide, as patient reports continued fatigue and mild cough Patient currently set up with home health and physical therapy RTC in 2 weeks to reassess restarting pomalidomide, would consider reducing dose back to 3 mg to prevent worsening of fatigue and attempt to dose increase again in the future Of note, patient's daughter reports patient had 4 doses of  pomalidomide 4mg  prior to hospitalization Hgb returned to patient's baseline after Urethral bleeding during hospitalization  Proceed with Zometa today as scheduled   Oral Chemotherapy Side Effect/Intolerance:  Patient current off pomalidomide  Oral Chemotherapy Adherence: Patient current off pomalidomide  New medications: patient started in iron by PCP, daughter unable to specify specific iron, iron po added to med list   Medication Access Issues: No issues, patient fills pomalidomide at [facility]  Patient expressed understanding and was in agreement with this plan. He also understands that He can call clinic at any time with any questions, concerns, or complaints.   Follow-up plan: RTC in 2 weeks  Thank you for allowing me to participate in the care of this very pleasant patient.   Time Total: 15 mins  Visit consisted of counseling and education on dealing with issues of symptom management in the setting of serious and potentially life-threatening illness.Greater than 50%  of this time was spent counseling and coordinating care related to the above assessment and plan.  Signed by: Remi Haggard, PharmD, BCPS, Nolon Bussing, CPP Hematology/Oncology Clinical Pharmacist Practitioner [place]otherapy [facility] [phone]  09/09/2022 9:53 AM

## 2022-09-09 NOTE — Progress Notes (Signed)
Patient states he is feeling weak. Patient still is having congestion in chest area. Patient has been having chest pain due trying to cough.

## 2022-09-10 ENCOUNTER — Ambulatory Visit: Payer: Medicare Other

## 2022-09-10 ENCOUNTER — Encounter (HOSPITAL_COMMUNITY): Payer: Self-pay | Admitting: Oncology

## 2022-09-11 ENCOUNTER — Other Ambulatory Visit: Payer: Self-pay | Admitting: Family Medicine

## 2022-09-11 ENCOUNTER — Ambulatory Visit: Payer: Medicare Other

## 2022-09-11 ENCOUNTER — Telehealth: Payer: Self-pay | Admitting: Family Medicine

## 2022-09-11 DIAGNOSIS — D630 Anemia in neoplastic disease: Secondary | ICD-10-CM | POA: Diagnosis not present

## 2022-09-11 DIAGNOSIS — N401 Enlarged prostate with lower urinary tract symptoms: Secondary | ICD-10-CM | POA: Diagnosis not present

## 2022-09-11 DIAGNOSIS — C9000 Multiple myeloma not having achieved remission: Secondary | ICD-10-CM | POA: Diagnosis not present

## 2022-09-11 DIAGNOSIS — J9601 Acute respiratory failure with hypoxia: Secondary | ICD-10-CM | POA: Diagnosis not present

## 2022-09-11 DIAGNOSIS — R338 Other retention of urine: Secondary | ICD-10-CM | POA: Diagnosis not present

## 2022-09-11 DIAGNOSIS — J189 Pneumonia, unspecified organism: Secondary | ICD-10-CM | POA: Diagnosis not present

## 2022-09-11 MED ORDER — AZITHROMYCIN 250 MG PO TABS: ORAL_TABLET | ORAL | 0 refills | Status: DC

## 2022-09-11 NOTE — Radiation Completion Notes (Signed)
Patient Name: Dean [name], Dean [name] MRN: [id number] Date of Birth: [birth year] Referring Physician: Lynnea Ferrier, M.D. Date of Service: 2022-09-11 Radiation Oncologist: Arnette Schaumann, M.D. [facility]                             RADIATION ONCOLOGY END OF TREATMENT NOTE     Diagnosis: C90.00 Multiple myeloma not having achieved remission Intent: Palliative     ==========DELIVERED PLANS==========  First Treatment Date: 2022-08-14 - Last Treatment Date: 2022-08-18   Plan Name: Pelvis_R Site: Hip, Right Technique: 3D Mode: Photon Dose Per Fraction: 4 Gy Prescribed Dose (Delivered / Prescribed): 4 Gy / 20 Gy Prescribed Fxs (Delivered / Prescribed): 1 / 5     ==========ON TREATMENT VISIT DATES==========      ==========UPCOMING VISITS========== 2022-11-04 No Location Listed Wait List No Provider Listed        ==========APPENDIX - ON TREATMENT VISIT NOTES==========   See weekly On Treatment Notes in Epic for details.

## 2022-09-11 NOTE — Telephone Encounter (Signed)
Patient's daughter [name] called to request another round of antibiotics: patient still coughing. Has rattling sound and a little wheezing. Some mucous comes up (sometimes yellowish; sometimes clear). [name] stated patient taking Robitussin DM which helps but patient still has mucous in chest.   Pharmacy confirmed as:  [facility] - [place], [place] - [address] [place], [place] [address] Phone: [phone]  Fax: [phone]   Please advise at [phone].

## 2022-09-12 ENCOUNTER — Ambulatory Visit: Payer: Medicare Other

## 2022-09-15 ENCOUNTER — Ambulatory Visit: Payer: Medicare Other

## 2022-09-16 ENCOUNTER — Ambulatory Visit (INDEPENDENT_AMBULATORY_CARE_PROVIDER_SITE_OTHER): Payer: Medicare Other | Admitting: Family Medicine

## 2022-09-16 ENCOUNTER — Ambulatory Visit: Payer: Medicare Other

## 2022-09-16 ENCOUNTER — Encounter: Payer: Self-pay | Admitting: Family Medicine

## 2022-09-16 VITALS — BP 116/62 | HR 84 | Temp 97.4°F | Ht 68.0 in | Wt 188.0 lb

## 2022-09-16 DIAGNOSIS — N401 Enlarged prostate with lower urinary tract symptoms: Secondary | ICD-10-CM | POA: Diagnosis not present

## 2022-09-16 DIAGNOSIS — C9000 Multiple myeloma not having achieved remission: Secondary | ICD-10-CM | POA: Diagnosis not present

## 2022-09-16 DIAGNOSIS — R310 Gross hematuria: Secondary | ICD-10-CM | POA: Diagnosis not present

## 2022-09-16 DIAGNOSIS — J9601 Acute respiratory failure with hypoxia: Secondary | ICD-10-CM | POA: Diagnosis not present

## 2022-09-16 DIAGNOSIS — R338 Other retention of urine: Secondary | ICD-10-CM | POA: Diagnosis not present

## 2022-09-16 DIAGNOSIS — J189 Pneumonia, unspecified organism: Secondary | ICD-10-CM | POA: Diagnosis not present

## 2022-09-16 DIAGNOSIS — D630 Anemia in neoplastic disease: Secondary | ICD-10-CM | POA: Diagnosis not present

## 2022-09-16 MED ORDER — SULFAMETHOXAZOLE-TRIMETHOPRIM 800-160 MG PO TABS: 1.0000 | ORAL_TABLET | Freq: Two times a day (BID) | ORAL | 0 refills | Status: DC

## 2022-09-16 NOTE — Progress Notes (Signed)
Subjective:    Patient ID: Dean Jourdain., male    DOB: 08-Oct-1935, 87 y.o.   MRN: [id number]  HPI Patient was admitted to the hospital with acute respiratory failure in July.  While in the emergency room, the patient had a Foley catheter placed.  Patient suffered some type of injury from this and developed a large clot in his bladder.  Ultimately urology was consulted to remove the clot.  They left a Foley catheter in place that was removed more than 2 weeks ago.  Patient was having gross hematuria while in the hospital but has been fine ever since hospital discharge with regards to the hematuria.  This morning he had gross hematuria.  He also reports dysuria.  He reports urgency and burning with urination.  He also reports low back pain radiating into his testicle.  Of note, the patient had a small kidney stone in his left kidney seen on CT scan in July.  Cystoscopy was performed at that time and saw no lesions in the bladder. Past Medical History:  Diagnosis Date   BPH (benign prostatic hyperplasia)    Colonic diverticular abscess 01/08/2015   Diverticulitis 12/22/2014   complicated by abscess and required percutaneous drainage   GERD (gastroesophageal reflux disease)    H/O ETOH abuse    History of chemotherapy last done jan 2017   History of radiation therapy 01/05/13-02/10/13   45 gray to left occipital condyle region   History of radiation therapy 12/01/16-12/10/16   Parasternal nodule, chest- 24 Gy total delivered in 8 fractions, Left sacro-iliac, pelvis- 24 Gy total delivered in 8 fractions    History of radiation therapy 02/19/17-02/22/17   right temporal scalp 30 Gy in 10 fractions   History of radiation therapy    Right hip( Pelvis) 11/18/21-11/21/21-Dr. Antony Blackbird   Intra-abdominal abscess (HCC)    Multiple myeloma (HCC) 2015   Neuropathy    Radiation 02/21/14-03/08/14   right posterior chest wall area 30 gray   Radiation 04/19/14-05/02/14   lumbar spine 25 gray   Skull  lesion    Left occipital condyle   Wrist fracture, left    x 2   Past Surgical History:  Procedure Laterality Date   COLONOSCOPY N/A 04/19/2015   Procedure: COLONOSCOPY;  Surgeon: West Bali, MD;  [facility];  Service: Endoscopy;  Laterality: N/A;  1:30 PM   CYST EXCISION  1959   tail bone   CYSTOSCOPY WITH FULGERATION N/A 08/20/2022   Procedure: CYSTOSCOPY WITH FULGERATION;  Surgeon: Crista Elliot, MD;  Location: [facility];  Service: Urology;  Laterality: N/A;  cysto clot evac   IR IMAGING GUIDED PORT INSERTION  09/25/2017   Current Outpatient Medications on File Prior to Visit  Medication Sig Dispense Refill   azithromycin (ZITHROMAX) 250 MG tablet 2 tabs poqday1, 1 tab poqday 2-5 6 tablet 0   acetaminophen (TYLENOL) 500 MG tablet Take 1,000 mg by mouth daily.     acyclovir (ZOVIRAX) 400 MG tablet Take 1 tablet (400 mg total) by mouth 2 (two) times daily. 60 tablet 6   ALPRAZolam (XANAX) 1 MG tablet TAKE ONE TABLET (1 MG TOTAL) BY MOUTH THREE (THREE) TIMES DAILY AS NEEDED. FOR ANXIETY (Patient taking differently: Take 1 mg by mouth 3 (three) times daily as needed for anxiety or sleep.) 90 tablet 0   CALCIUM-VITAMIN D PO Take 1 tablet by mouth 2 (two) times daily.      cyanocobalamin (VITAMIN B12) 500 MCG  tablet Take 1 tablet (500 mcg total) by mouth daily.     dexamethasone (DECADRON) 4 MG tablet Take 1 tablet (4 mg total) by mouth every other day. 90 tablet 1   ELIQUIS 5 MG TABS tablet TAKE ONE TABLET BY MOUTH TWICE DAILY 60 tablet 1   Ferrous Sulfate (IRON PO) Take by mouth.     fluticasone (FLONASE) 50 MCG/ACT nasal spray PLACE 2 SPRAYS INTO BOTH NOSTRILS DAILY 16 g 6   folic acid (FOLVITE) 1 MG tablet Take 1 tablet (1 mg total) by mouth daily. 90 tablet 1   furosemide (LASIX) 20 MG tablet Take 1 tablet (20 mg total) by mouth daily as needed. (Patient taking differently: Take 20 mg by mouth every other day.) 90 tablet 1   gabapentin (NEURONTIN) 400 MG capsule Take 1  capsule (400 mg total) by mouth 3 (three) times daily. (Patient taking differently: Take 400 mg by mouth 2 (two) times daily.) 90 capsule 4   guaiFENesin-dextromethorphan (ROBITUSSIN DM) 100-10 MG/5ML syrup Take 5 mLs by mouth every 4 (four) hours as needed for cough (chest congestion). 473 mL 0   Homeopathic Products (THERAWORX RELIEF EX) Apply topically as needed.     lidocaine-prilocaine (EMLA) cream Apply small amount over port one (1) hour prior to appointment. 30 g 0   metoprolol tartrate (LOPRESSOR) 25 MG tablet Take 0.5 tablets (12.5 mg total) by mouth 2 (two) times daily. 30 tablet 1   montelukast (SINGULAIR) 10 MG tablet TAKE 1 TABLET BY MOUTH EVERY NIGHT AT BEDTIME 30 tablet 3   Oxycodone HCl 10 MG TABS TAKE ONE TO TWO TABLETS BY MOUTH THREE TIMES DAILY AS NEEDED 90 tablet 0   pomalidomide (POMALYST) 4 MG capsule Take 1 capsule (4 mg total) by mouth daily. Take for 21 days, then hold for 7 days. Repeat every 28 days. (Patient not taking: Reported on 09/09/2022) 21 capsule 0   tamsulosin (FLOMAX) 0.4 MG CAPS capsule TAKE ONE CAPSULE BY MOUTH EVERY NIGHT ATBEDTIME 30 capsule 5   traZODone (DESYREL) 100 MG tablet TAKE THREE TABLETS BY MOUTH EVERY NIGHT AT BEDTIME (Patient taking differently: Take 300 mg by mouth at bedtime.) 90 tablet 1   TURMERIC PO Take 1 tablet by mouth 2 (two) times daily.     Current Facility-Administered Medications on File Prior to Visit  Medication Dose Route Frequency Provider Last Rate Last Admin   heparin lock flush 100 unit/mL  500 Units Intravenous Once Orlie Dakin, Tollie Pizza, MD       Allergies  Allergen Reactions   Codeine Other (See Comments)    Headache   Morphine And Codeine Other (See Comments)    Makes him feel weird   Revlimid [Lenalidomide] Other (See Comments)    "Causes me to become weak"   Social History   Socioeconomic History   Marital status: Significant Other    Spouse name: Mary   Number of children: 3   Years of education: Not on file    Highest education level: Not on file  Occupational History    Employer: RETIRED  Tobacco Use   Smoking status: Never   Smokeless tobacco: Never  Vaping Use   Vaping status: Never Used  Substance and Sexual Activity   Alcohol use: No    Comment: " Not much no more"   Drug use: No   Sexual activity: Not on file  Other Topics Concern   Not on file  Social History Narrative   Not on file   Social  Determinants of Health   Financial Resource Strain: Low Risk  (03/21/2022)   Overall Financial Resource Strain (CARDIA)    Difficulty of Paying Living Expenses: Not very hard  Food Insecurity: No Food Insecurity (08/25/2022)   Hunger Vital Sign    Worried About Running Out of Food in the Last Year: Never true    Ran Out of Food in the Last Year: Never true  Transportation Needs: No Transportation Needs (08/25/2022)   PRAPARE - Administrator, Civil Service (Medical): No    Lack of Transportation (Non-Medical): No  Physical Activity: Inactive (01/09/2022)   Exercise Vital Sign    Days of Exercise per Week: 0 days    Minutes of Exercise per Session: 0 min  Stress: Stress Concern Present (01/09/2022)   [employer] of Occupational Health - Occupational Stress Questionnaire    Feeling of Stress : To some extent  Social Connections: Moderately Isolated (01/09/2022)   Social Connection and Isolation Panel [NHANES]    Frequency of Communication with Friends and Family: Three times a week    Frequency of Social Gatherings with Friends and Family: Three times a week    Attends Religious Services: Never    Active Member of Clubs or Organizations: No    Attends Banker Meetings: Never    Marital Status: Living with partner  Intimate Partner Violence: Not At Risk (08/19/2022)   Humiliation, Afraid, Rape, and Kick questionnaire    Fear of Current or Ex-Partner: No    Emotionally Abused: No    Physically Abused: No    Sexually Abused: No      Review of  Systems     Objective:   Physical Exam Vitals reviewed.  Constitutional:      General: He is not in acute distress.    Appearance: He is obese. He is ill-appearing. He is not toxic-appearing.  Cardiovascular:     Rate and Rhythm: Normal rate and regular rhythm.     Pulses: Normal pulses.     Heart sounds: Normal heart sounds.  Pulmonary:     Effort: Pulmonary effort is normal. No respiratory distress.     Breath sounds: Normal breath sounds. No stridor. No wheezing, rhonchi or rales.  Abdominal:     General: Abdomen is flat. Bowel sounds are normal. There is no distension.     Palpations: Abdomen is soft.     Tenderness: There is no abdominal tenderness. There is no guarding or rebound.  Neurological:     General: No focal deficit present.     Mental Status: He is alert and oriented to person, place, and time.     Cranial Nerves: No cranial nerve deficit.     Motor: No weakness.           Assessment & Plan:  Gross hematuria Differential diagnosis includes urinary tract infection, nephrolithiasis, urinary tract malignancy, or rebleeding from the previous site of trauma.  The patient is unable to provide a urine sample today.  However his symptoms suggest a urinary tract infection.  He also has a kidney stone seen on the CT scan in July that may be trying to pass.  Patient is already on pain medication Flomax so no further medication is required to help the kidney stone passed.  I will start the patient on Bactrim double strength tablets twice daily for 7 days to cover a possible urinary tract infection.  I recommended that he hold his Eliquis for 4 days to allow the  hematuria to resolve.  He is already had a CT scan to rule out malignancy in the kidneys and urinary tract along with his cystoscopy.  Therefore the only additional workup would be necessary as if the bleeding does not stop which will require urology consultation and cystoscopy again.  Hopefully treatment for a possible  bladder infection and discontinuation temporarily of Eliquis will cause the gross hematuria.

## 2022-09-17 ENCOUNTER — Telehealth: Payer: Self-pay

## 2022-09-17 DIAGNOSIS — R310 Gross hematuria: Secondary | ICD-10-CM | POA: Diagnosis not present

## 2022-09-17 NOTE — Telephone Encounter (Addendum)
Daughter left message stating that patient has been voiding straight blood since yesterday and requested to be seen or for advice. Symptoms and patients history reviewed with Dr. Retta Diones. Provider advised to bring patient in to be seen. Returned call to daughter and she states she was able to get  in touch with Dr. Alvester Morin  who performed the surgery and he will see patient this afternoon.

## 2022-09-18 ENCOUNTER — Encounter (HOSPITAL_COMMUNITY): Payer: Self-pay | Admitting: Oncology

## 2022-09-18 DIAGNOSIS — N401 Enlarged prostate with lower urinary tract symptoms: Secondary | ICD-10-CM | POA: Diagnosis not present

## 2022-09-18 DIAGNOSIS — R338 Other retention of urine: Secondary | ICD-10-CM | POA: Diagnosis not present

## 2022-09-18 DIAGNOSIS — J9601 Acute respiratory failure with hypoxia: Secondary | ICD-10-CM | POA: Diagnosis not present

## 2022-09-18 DIAGNOSIS — C9000 Multiple myeloma not having achieved remission: Secondary | ICD-10-CM | POA: Diagnosis not present

## 2022-09-18 DIAGNOSIS — D630 Anemia in neoplastic disease: Secondary | ICD-10-CM | POA: Diagnosis not present

## 2022-09-18 DIAGNOSIS — J189 Pneumonia, unspecified organism: Secondary | ICD-10-CM | POA: Diagnosis not present

## 2022-09-19 ENCOUNTER — Ambulatory Visit: Payer: Medicare Other | Admitting: Urology

## 2022-09-19 ENCOUNTER — Telehealth: Payer: Self-pay

## 2022-09-19 ENCOUNTER — Encounter (HOSPITAL_COMMUNITY): Payer: Self-pay | Admitting: Oncology

## 2022-09-19 DIAGNOSIS — D630 Anemia in neoplastic disease: Secondary | ICD-10-CM | POA: Diagnosis not present

## 2022-09-19 DIAGNOSIS — C9000 Multiple myeloma not having achieved remission: Secondary | ICD-10-CM | POA: Diagnosis not present

## 2022-09-19 DIAGNOSIS — N401 Enlarged prostate with lower urinary tract symptoms: Secondary | ICD-10-CM | POA: Diagnosis not present

## 2022-09-19 DIAGNOSIS — J189 Pneumonia, unspecified organism: Secondary | ICD-10-CM | POA: Diagnosis not present

## 2022-09-19 DIAGNOSIS — J9601 Acute respiratory failure with hypoxia: Secondary | ICD-10-CM | POA: Diagnosis not present

## 2022-09-19 DIAGNOSIS — R338 Other retention of urine: Secondary | ICD-10-CM | POA: Diagnosis not present

## 2022-09-19 NOTE — Telephone Encounter (Signed)
Patient states that his appointment was supposed to been cancel.

## 2022-09-19 NOTE — Progress Notes (Deleted)
Name: Dean [name]. DOB: 1935-04-05 MRN: [id number]  History of Present Illness: Mr. Ohlman is a 87 y.o. male who presents today for follow up visit at [facility] [place]. He is accompanied by his daughter [name]. - GU history: 1. BPH with BOO.   Recent history:  > Admitted 08/19/2022 - 08/24/2022 for acute hypoxic respiratory failure secondary to pneumonia and a pulmonary embolism. Of note he has multiple myeloma. During admission was found to have urinary retention requiring coude catheter placement, which was difficult. He subsequently had gross hematuria "secondary to prostatic bleeding and likely traumatic Foley" and underwent cystoscopy with clot evacuation and fulguration by Dr. Alvester Morin on 08/20/2022.   > 08/29/2022: Seen by PCP due to "feeling extremely weak". The plan was for patient to stop by PCP office for fingerstick hemoglobin check to determine if he is anemic to the point of needing a blood transfusion. He was also prescribed Levaquin 500 mg x7 days for possible residual pneumonia / possible catheter-associated UTI.   At initial urology clinic visit on 09/01/2022: - Patient denied prior history of urinary retention.His daughter reports this current episode all started with a nurse trying to do I&O cath to obtain a urine specimen and the subsequent bleeding / clot retention due to traumatic catheterization with patient on a blood thinner.  - Passed voiding trial; Foley catheter discontinued. - Advised to continue Flomax daily. - The plan was follow up PRN.   Since last visit: 09/17/2022: Per phone note, patient's daughter reported patient having gross hematuria since 09/16/2022. Patient ***seen by Dr. Alvester Morin at [facility] ***?  Today: He reports ***  He {Actions; denies-reports:120008} increased urinary urgency, frequency, nocturia, dysuria, gross hematuria, hesitancy, straining to void, or sensations of incomplete emptying.   Fall Screening: Do you usually  have a device to assist in your mobility? {yes/no:20286} ***cane / ***walker / ***wheelchair   Medications: Current Outpatient Medications  Medication Sig Dispense Refill   acetaminophen (TYLENOL) 500 MG tablet Take 1,000 mg by mouth daily.     acyclovir (ZOVIRAX) 400 MG tablet Take 1 tablet (400 mg total) by mouth 2 (two) times daily. 60 tablet 6   ALPRAZolam (XANAX) 1 MG tablet TAKE ONE TABLET (1 MG TOTAL) BY MOUTH THREE (THREE) TIMES DAILY AS NEEDED. FOR ANXIETY (Patient taking differently: Take 1 mg by mouth 3 (three) times daily as needed for anxiety or sleep.) 90 tablet 0   azithromycin (ZITHROMAX) 250 MG tablet 2 tabs poqday1, 1 tab poqday 2-5 6 tablet 0   CALCIUM-VITAMIN D PO Take 1 tablet by mouth 2 (two) times daily.      cyanocobalamin (VITAMIN B12) 500 MCG tablet Take 1 tablet (500 mcg total) by mouth daily.     dexamethasone (DECADRON) 4 MG tablet Take 1 tablet (4 mg total) by mouth every other day. 90 tablet 1   ELIQUIS 5 MG TABS tablet TAKE ONE TABLET BY MOUTH TWICE DAILY 60 tablet 1   Ferrous Sulfate (IRON PO) Take by mouth.     fluticasone (FLONASE) 50 MCG/ACT nasal spray PLACE 2 SPRAYS INTO BOTH NOSTRILS DAILY 16 g 6   folic acid (FOLVITE) 1 MG tablet Take 1 tablet (1 mg total) by mouth daily. 90 tablet 1   furosemide (LASIX) 20 MG tablet Take 1 tablet (20 mg total) by mouth daily as needed. (Patient taking differently: Take 20 mg by mouth every other day.) 90 tablet 1   gabapentin (NEURONTIN) 400 MG capsule Take 1 capsule (400 mg  total) by mouth 3 (three) times daily. (Patient taking differently: Take 400 mg by mouth 2 (two) times daily.) 90 capsule 4   guaiFENesin-dextromethorphan (ROBITUSSIN DM) 100-10 MG/5ML syrup Take 5 mLs by mouth every 4 (four) hours as needed for cough (chest congestion). 473 mL 0   Homeopathic Products (THERAWORX RELIEF EX) Apply topically as needed.     lidocaine-prilocaine (EMLA) cream Apply small amount over port one (1) hour prior to appointment.  30 g 0   metoprolol tartrate (LOPRESSOR) 25 MG tablet Take 0.5 tablets (12.5 mg total) by mouth 2 (two) times daily. 30 tablet 1   montelukast (SINGULAIR) 10 MG tablet TAKE 1 TABLET BY MOUTH EVERY NIGHT AT BEDTIME 30 tablet 3   Oxycodone HCl 10 MG TABS TAKE ONE TO TWO TABLETS BY MOUTH THREE TIMES DAILY AS NEEDED 90 tablet 0   pomalidomide (POMALYST) 4 MG capsule Take 1 capsule (4 mg total) by mouth daily. Take for 21 days, then hold for 7 days. Repeat every 28 days. 21 capsule 0   sulfamethoxazole-trimethoprim (BACTRIM DS) 800-160 MG tablet Take 1 tablet by mouth 2 (two) times daily. 14 tablet 0   tamsulosin (FLOMAX) 0.4 MG CAPS capsule TAKE ONE CAPSULE BY MOUTH EVERY NIGHT ATBEDTIME 30 capsule 5   traZODone (DESYREL) 100 MG tablet TAKE THREE TABLETS BY MOUTH EVERY NIGHT AT BEDTIME (Patient taking differently: Take 300 mg by mouth at bedtime.) 90 tablet 1   TURMERIC PO Take 1 tablet by mouth 2 (two) times daily.     No current facility-administered medications for this visit.   Facility-Administered Medications Ordered in Other Visits  Medication Dose Route Frequency Provider Last Rate Last Admin   heparin lock flush 100 unit/mL  500 Units Intravenous Once Jeralyn Ruths, MD        Allergies: Allergies  Allergen Reactions   Codeine Other (See Comments)    Headache   Morphine And Codeine Other (See Comments)    Makes him feel weird   Revlimid [Lenalidomide] Other (See Comments)    "Causes me to become weak"    Past Medical History:  Diagnosis Date   BPH (benign prostatic hyperplasia)    Colonic diverticular abscess 01/08/2015   Diverticulitis 12/22/2014   complicated by abscess and required percutaneous drainage   GERD (gastroesophageal reflux disease)    H/O ETOH abuse    History of chemotherapy last done jan 2017   History of radiation therapy 01/05/13-02/10/13   45 gray to left occipital condyle region   History of radiation therapy 12/01/16-12/10/16   Parasternal nodule,  chest- 24 Gy total delivered in 8 fractions, Left sacro-iliac, pelvis- 24 Gy total delivered in 8 fractions    History of radiation therapy 02/19/17-02/22/17   right temporal scalp 30 Gy in 10 fractions   History of radiation therapy    Right hip( Pelvis) 11/18/21-11/21/21-Dr. Antony Blackbird   Intra-abdominal abscess (HCC)    Multiple myeloma (HCC) 2015   Neuropathy    Radiation 02/21/14-03/08/14   right posterior chest wall area 30 gray   Radiation 04/19/14-05/02/14   lumbar spine 25 gray   Skull lesion    Left occipital condyle   Wrist fracture, left    x 2   Past Surgical History:  Procedure Laterality Date   COLONOSCOPY N/A 04/19/2015   Procedure: COLONOSCOPY;  Surgeon: West Bali, MD;  [facility];  Service: Endoscopy;  Laterality: N/A;  1:30 PM   CYST EXCISION  1959   tail bone  CYSTOSCOPY WITH FULGERATION N/A 08/20/2022   Procedure: CYSTOSCOPY WITH FULGERATION;  Surgeon: Crista Elliot, MD;  Location: [facility];  Service: Urology;  Laterality: N/A;  cysto clot evac   IR IMAGING GUIDED PORT INSERTION  09/25/2017   Family History  Problem Relation Age of Onset   Diabetes Mother    Stroke Mother    Hypertension Father    Social History   Socioeconomic History   Marital status: Significant Other    Spouse name: Mary   Number of children: 3   Years of education: Not on file   Highest education level: Not on file  Occupational History    Employer: RETIRED  Tobacco Use   Smoking status: Never   Smokeless tobacco: Never  Vaping Use   Vaping status: Never Used  Substance and Sexual Activity   Alcohol use: No    Comment: " Not much no more"   Drug use: No   Sexual activity: Not on file  Other Topics Concern   Not on file  Social History Narrative   Not on file   Social Determinants of Health   Financial Resource Strain: Low Risk  (03/21/2022)   Overall Financial Resource Strain (CARDIA)    Difficulty of Paying Living Expenses: Not very hard  Food  Insecurity: No Food Insecurity (08/25/2022)   Hunger Vital Sign    Worried About Running Out of Food in the Last Year: Never true    Ran Out of Food in the Last Year: Never true  Transportation Needs: No Transportation Needs (08/25/2022)   PRAPARE - Administrator, Civil Service (Medical): No    Lack of Transportation (Non-Medical): No  Physical Activity: Inactive (01/09/2022)   Exercise Vital Sign    Days of Exercise per Week: 0 days    Minutes of Exercise per Session: 0 min  Stress: Stress Concern Present (01/09/2022)   [employer] of Occupational Health - Occupational Stress Questionnaire    Feeling of Stress : To some extent  Social Connections: Moderately Isolated (01/09/2022)   Social Connection and Isolation Panel [NHANES]    Frequency of Communication with Friends and Family: Three times a week    Frequency of Social Gatherings with Friends and Family: Three times a week    Attends Religious Services: Never    Active Member of Clubs or Organizations: No    Attends Banker Meetings: Never    Marital Status: Living with partner  Intimate Partner Violence: Not At Risk (08/19/2022)   Humiliation, Afraid, Rape, and Kick questionnaire    Fear of Current or Ex-Partner: No    Emotionally Abused: No    Physically Abused: No    Sexually Abused: No    Review of Systems Constitutional: Patient ***denies any unintentional weight loss or change in strength lntegumentary: Patient ***denies any rashes or pruritus Eyes: Patient denies ***dry eyes ENT: Patient ***denies dry mouth Cardiovascular: Patient ***denies chest pain or syncope Respiratory: Patient ***denies shortness of breath Gastrointestinal: Patient ***denies nausea, vomiting, constipation, or diarrhea Musculoskeletal: Patient ***denies muscle cramps or weakness Neurologic: Patient ***denies convulsions or seizures Psychiatric: Patient ***denies memory problems Allergic/Immunologic: Patient ***denies  recent allergic reaction(s) Hematologic/Lymphatic: Patient denies bleeding tendencies Endocrine: Patient ***denies heat/cold intolerance  GU: As per HPI.  OBJECTIVE There were no vitals filed for this visit. There is no height or weight on file to calculate BMI.  Physical Examination  Constitutional: ***No obvious distress; patient is ***non-toxic appearing  Cardiovascular: ***No visible lower extremity  edema.  Respiratory: The patient does ***not have audible wheezing/stridor; respirations do ***not appear labored  Gastrointestinal: Abdomen ***non-distended Musculoskeletal: ***Normal ROM of UEs  Skin: ***No obvious rashes/open sores  Neurologic: CN 2-12 grossly ***intact Psychiatric: Answered questions ***appropriately with ***normal affect  Hematologic/Lymphatic/Immunologic: ***No obvious bruises or sites of spontaneous bleeding  UA: {Desc; negative/positive:13464} for *** WBC/hpf, *** RBC/hpf, bacteria (***) PVR: *** ml  ASSESSMENT No diagnosis found. ***  Will plan for follow up in *** months / ***1 year or sooner if needed. Pt verbalized understanding and agreement. All questions were answered.  PLAN Advised the following: 1. *** 2. ***No follow-ups on file.  No orders of the defined types were placed in this encounter.   It has been explained that the patient is to follow regularly with their PCP in addition to all other providers involved in their care and to follow instructions provided by these respective offices. Patient advised to contact urology clinic if any urologic-pertaining questions, concerns, new symptoms or problems arise in the interim period.  There are no Patient Instructions on file for this visit.  Electronically signed by:  Donnita Falls, FNP   09/19/22    8:13 AM

## 2022-09-23 ENCOUNTER — Ambulatory Visit: Payer: Medicare Other | Admitting: Pharmacist

## 2022-09-23 ENCOUNTER — Ambulatory Visit: Payer: Medicare Other | Admitting: Oncology

## 2022-09-23 ENCOUNTER — Other Ambulatory Visit: Payer: Medicare Other

## 2022-09-23 DIAGNOSIS — D630 Anemia in neoplastic disease: Secondary | ICD-10-CM | POA: Diagnosis not present

## 2022-09-23 DIAGNOSIS — J9601 Acute respiratory failure with hypoxia: Secondary | ICD-10-CM | POA: Diagnosis not present

## 2022-09-23 DIAGNOSIS — R338 Other retention of urine: Secondary | ICD-10-CM | POA: Diagnosis not present

## 2022-09-23 DIAGNOSIS — C9000 Multiple myeloma not having achieved remission: Secondary | ICD-10-CM | POA: Diagnosis not present

## 2022-09-23 DIAGNOSIS — J189 Pneumonia, unspecified organism: Secondary | ICD-10-CM | POA: Diagnosis not present

## 2022-09-23 DIAGNOSIS — N401 Enlarged prostate with lower urinary tract symptoms: Secondary | ICD-10-CM | POA: Diagnosis not present

## 2022-09-25 ENCOUNTER — Other Ambulatory Visit: Payer: Self-pay | Admitting: Family Medicine

## 2022-09-25 ENCOUNTER — Other Ambulatory Visit: Payer: Self-pay | Admitting: Oncology

## 2022-09-25 DIAGNOSIS — J9601 Acute respiratory failure with hypoxia: Secondary | ICD-10-CM | POA: Diagnosis not present

## 2022-09-25 DIAGNOSIS — C9000 Multiple myeloma not having achieved remission: Secondary | ICD-10-CM | POA: Diagnosis not present

## 2022-09-25 DIAGNOSIS — R338 Other retention of urine: Secondary | ICD-10-CM | POA: Diagnosis not present

## 2022-09-25 DIAGNOSIS — J189 Pneumonia, unspecified organism: Secondary | ICD-10-CM | POA: Diagnosis not present

## 2022-09-25 DIAGNOSIS — D630 Anemia in neoplastic disease: Secondary | ICD-10-CM | POA: Diagnosis not present

## 2022-09-25 DIAGNOSIS — N401 Enlarged prostate with lower urinary tract symptoms: Secondary | ICD-10-CM | POA: Diagnosis not present

## 2022-09-26 DIAGNOSIS — N3000 Acute cystitis without hematuria: Secondary | ICD-10-CM | POA: Diagnosis not present

## 2022-09-26 DIAGNOSIS — R310 Gross hematuria: Secondary | ICD-10-CM | POA: Diagnosis not present

## 2022-09-26 NOTE — Telephone Encounter (Signed)
Requested medication (s) are due for refill today: yes  Requested medication (s) are on the active medication list: yes  Last refill:  03/03/22  Future visit scheduled: no  Notes to clinic:  Unable to refill per protocol, courtesy refill already given, routing for provider approval.      Requested Prescriptions  Pending Prescriptions Disp Refills   montelukast (SINGULAIR) 10 MG tablet [Pharmacy Med Name: MONTELUKAST SODIUM 10MG  TABLET] 30 tablet 3    Sig: TAKE ONE TABLET BY MOUTH EVERY NIGHT AT BEDTIME (NEED APPT WITH PRIMIARY CARE PROVIDER FOR REFILLS)     Pulmonology:  Leukotriene Inhibitors Failed - 09/25/2022 10:22 AM      Failed - Valid encounter within last 12 months    Recent Outpatient Visits           1 year ago Dysuria   [facility] Donita Brooks, MD   1 year ago Hypotension due to hypovolemia   [facility] Donita Brooks, MD   1 year ago Hypotension due to hypovolemia   [facility] Donita Brooks, MD   1 year ago Hypotension due to hypovolemia   [facility] Valentino Nose, NP   1 year ago Seasonal allergic rhinitis due to pollen   [facility] Valentino Nose, NP

## 2022-09-27 DIAGNOSIS — Z556 Problems related to health literacy: Secondary | ICD-10-CM | POA: Diagnosis not present

## 2022-09-27 DIAGNOSIS — N401 Enlarged prostate with lower urinary tract symptoms: Secondary | ICD-10-CM | POA: Diagnosis not present

## 2022-09-27 DIAGNOSIS — Z79899 Other long term (current) drug therapy: Secondary | ICD-10-CM | POA: Diagnosis not present

## 2022-09-27 DIAGNOSIS — Z7901 Long term (current) use of anticoagulants: Secondary | ICD-10-CM | POA: Diagnosis not present

## 2022-09-27 DIAGNOSIS — C9000 Multiple myeloma not having achieved remission: Secondary | ICD-10-CM | POA: Diagnosis not present

## 2022-09-27 DIAGNOSIS — J9601 Acute respiratory failure with hypoxia: Secondary | ICD-10-CM | POA: Diagnosis not present

## 2022-09-27 DIAGNOSIS — K219 Gastro-esophageal reflux disease without esophagitis: Secondary | ICD-10-CM | POA: Diagnosis not present

## 2022-09-27 DIAGNOSIS — M6281 Muscle weakness (generalized): Secondary | ICD-10-CM | POA: Diagnosis not present

## 2022-09-27 DIAGNOSIS — G629 Polyneuropathy, unspecified: Secondary | ICD-10-CM | POA: Diagnosis not present

## 2022-09-27 DIAGNOSIS — K5790 Diverticulosis of intestine, part unspecified, without perforation or abscess without bleeding: Secondary | ICD-10-CM | POA: Diagnosis not present

## 2022-09-27 DIAGNOSIS — D630 Anemia in neoplastic disease: Secondary | ICD-10-CM | POA: Diagnosis not present

## 2022-09-27 DIAGNOSIS — J189 Pneumonia, unspecified organism: Secondary | ICD-10-CM | POA: Diagnosis not present

## 2022-09-27 DIAGNOSIS — R338 Other retention of urine: Secondary | ICD-10-CM | POA: Diagnosis not present

## 2022-09-27 DIAGNOSIS — I4891 Unspecified atrial fibrillation: Secondary | ICD-10-CM | POA: Diagnosis not present

## 2022-09-27 DIAGNOSIS — R262 Difficulty in walking, not elsewhere classified: Secondary | ICD-10-CM | POA: Diagnosis not present

## 2022-09-27 DIAGNOSIS — I2699 Other pulmonary embolism without acute cor pulmonale: Secondary | ICD-10-CM | POA: Diagnosis not present

## 2022-09-27 DIAGNOSIS — Z79891 Long term (current) use of opiate analgesic: Secondary | ICD-10-CM | POA: Diagnosis not present

## 2022-09-29 ENCOUNTER — Inpatient Hospital Stay: Payer: Medicare Other

## 2022-09-29 ENCOUNTER — Other Ambulatory Visit: Payer: Self-pay | Admitting: *Deleted

## 2022-09-29 ENCOUNTER — Inpatient Hospital Stay: Payer: Medicare Other | Admitting: Hospice and Palliative Medicine

## 2022-09-29 ENCOUNTER — Encounter: Payer: Self-pay | Admitting: Oncology

## 2022-09-29 ENCOUNTER — Inpatient Hospital Stay: Payer: Medicare Other | Admitting: Oncology

## 2022-09-29 ENCOUNTER — Inpatient Hospital Stay: Payer: Medicare Other | Admitting: Pharmacist

## 2022-09-29 VITALS — BP 107/72 | HR 115 | Temp 98.2°F | Wt 190.0 lb

## 2022-09-29 DIAGNOSIS — C9000 Multiple myeloma not having achieved remission: Secondary | ICD-10-CM

## 2022-09-29 DIAGNOSIS — R296 Repeated falls: Secondary | ICD-10-CM

## 2022-09-29 DIAGNOSIS — I2699 Other pulmonary embolism without acute cor pulmonale: Secondary | ICD-10-CM | POA: Diagnosis not present

## 2022-09-29 DIAGNOSIS — G893 Neoplasm related pain (acute) (chronic): Secondary | ICD-10-CM

## 2022-09-29 DIAGNOSIS — D649 Anemia, unspecified: Secondary | ICD-10-CM | POA: Diagnosis not present

## 2022-09-29 DIAGNOSIS — M2550 Pain in unspecified joint: Secondary | ICD-10-CM | POA: Diagnosis not present

## 2022-09-29 DIAGNOSIS — M549 Dorsalgia, unspecified: Secondary | ICD-10-CM | POA: Diagnosis not present

## 2022-09-29 LAB — CBC WITH DIFFERENTIAL/PLATELET
Abs Immature Granulocytes: 0.04 10*3/uL (ref 0.00–0.07)
Basophils Absolute: 0 10*3/uL (ref 0.0–0.1)
Basophils Relative: 0 %
Eosinophils Absolute: 0 10*3/uL (ref 0.0–0.5)
Eosinophils Relative: 0 %
HCT: 34.3 % — ABNORMAL LOW (ref 39.0–52.0)
Hemoglobin: 10.5 g/dL — ABNORMAL LOW (ref 13.0–17.0)
Immature Granulocytes: 1 %
Lymphocytes Relative: 22 %
Lymphs Abs: 1.7 10*3/uL (ref 0.7–4.0)
MCH: 29.9 pg (ref 26.0–34.0)
MCHC: 30.6 g/dL (ref 30.0–36.0)
MCV: 97.7 fL (ref 80.0–100.0)
Monocytes Absolute: 0.6 10*3/uL (ref 0.1–1.0)
Monocytes Relative: 8 %
Neutro Abs: 5.2 10*3/uL (ref 1.7–7.7)
Neutrophils Relative %: 69 %
Platelets: 126 10*3/uL — ABNORMAL LOW (ref 150–400)
RBC: 3.51 MIL/uL — ABNORMAL LOW (ref 4.22–5.81)
RDW: 18.1 % — ABNORMAL HIGH (ref 11.5–15.5)
WBC: 7.5 10*3/uL (ref 4.0–10.5)
nRBC: 0 % (ref 0.0–0.2)

## 2022-09-29 LAB — COMPREHENSIVE METABOLIC PANEL
ALT: 12 U/L (ref 0–44)
AST: 16 U/L (ref 15–41)
Albumin: 2.4 g/dL — ABNORMAL LOW (ref 3.5–5.0)
Alkaline Phosphatase: 28 U/L — ABNORMAL LOW (ref 38–126)
Anion gap: 4 — ABNORMAL LOW (ref 5–15)
BUN: 25 mg/dL — ABNORMAL HIGH (ref 8–23)
CO2: 22 mmol/L (ref 22–32)
Calcium: 8 mg/dL — ABNORMAL LOW (ref 8.9–10.3)
Chloride: 108 mmol/L (ref 98–111)
Creatinine, Ser: 0.58 mg/dL — ABNORMAL LOW (ref 0.61–1.24)
GFR, Estimated: >60 mL/min (ref >60)
Glucose, Bld: 123 mg/dL — ABNORMAL HIGH (ref 70–99)
Potassium: 3.6 mmol/L (ref 3.5–5.1)
Sodium: 134 mmol/L — ABNORMAL LOW (ref 135–145)
Total Bilirubin: 0.3 mg/dL (ref 0.3–1.2)
Total Protein: 6.6 g/dL (ref 6.5–8.1)

## 2022-09-29 LAB — SAMPLE TO BLOOD BANK

## 2022-09-29 MED ORDER — POMALIDOMIDE 3 MG PO CAPS
3.0000 mg | ORAL_CAPSULE | Freq: Every day | ORAL | 0 refills | Status: DC
Start: 2022-09-29 — End: 2022-11-27

## 2022-09-29 NOTE — Progress Notes (Signed)
[facility]  [phone] [phone]  ID: Dean [name]. OB: 08/14/1935  MR#: [id number]  IHK#:[id number]  Patient Care Team: Donita Brooks, MD as PCP - General (Family Medicine) Antony Blackbird, MD as Consulting Physician (Radiation Oncology) West Bali, MD (Inactive) as Consulting Physician (Gastroenterology) Karie Soda, MD as Consulting Physician (General Surgery) Jeralyn Ruths, MD as Consulting Physician (Oncology)  CHIEF COMPLAINT: Multiple myeloma  INTERVAL HISTORY: Patient returns to clinic today for repeat laboratory work, further evaluation, and consideration of reinitiation of Pomalyst.  He continues to have chronic weakness and fatigue, but states this is improving with the help of physical therapy twice per week.  He continues to have chronic pain. He denies any fevers.  He has no neurologic complaints.  He denies any fevers.  His appetite has improved.  He has no chest pain, shortness of breath, cough, or hemoptysis.  He denies any nausea, vomiting, constipation, or diarrhea.  He has no urinary complaints.  Patient offers no further specific complaints today.  REVIEW OF SYSTEMS:   Review of Systems  Constitutional:  Positive for malaise/fatigue. Negative for fever and weight loss.  Respiratory: Negative.  Negative for cough, hemoptysis and shortness of breath.   Cardiovascular: Negative.  Negative for chest pain and leg swelling.  Gastrointestinal: Negative.  Negative for abdominal pain and nausea.  Genitourinary: Negative.  Negative for dysuria and flank pain.  Musculoskeletal:  Positive for back pain, falls and joint pain.  Skin: Negative.  Negative for rash.  Neurological:  Positive for sensory change and weakness. Negative for dizziness, focal weakness and headaches.  Psychiatric/Behavioral: Negative.  The patient is not nervous/anxious and does not have insomnia.     As per HPI. Otherwise, a complete review of  systems is negative.  PAST MEDICAL HISTORY: Past Medical History:  Diagnosis Date   BPH (benign prostatic hyperplasia)    Colonic diverticular abscess 01/08/2015   Diverticulitis 12/22/2014   complicated by abscess and required percutaneous drainage   GERD (gastroesophageal reflux disease)    H/O ETOH abuse    History of chemotherapy last done jan 2017   History of radiation therapy 01/05/13-02/10/13   45 gray to left occipital condyle region   History of radiation therapy 12/01/16-12/10/16   Parasternal nodule, chest- 24 Gy total delivered in 8 fractions, Left sacro-iliac, pelvis- 24 Gy total delivered in 8 fractions    History of radiation therapy 02/19/17-02/22/17   right temporal scalp 30 Gy in 10 fractions   History of radiation therapy    Right hip( Pelvis) 11/18/21-11/21/21-Dr. Antony Blackbird   Intra-abdominal abscess (HCC)    Multiple myeloma (HCC) 2015   Neuropathy    Radiation 02/21/14-03/08/14   right posterior chest wall area 30 gray   Radiation 04/19/14-05/02/14   lumbar spine 25 gray   Skull lesion    Left occipital condyle   Wrist fracture, left    x 2    PAST SURGICAL HISTORY: Past Surgical History:  Procedure Laterality Date   COLONOSCOPY N/A 04/19/2015   Procedure: COLONOSCOPY;  Surgeon: West Bali, MD;  Location: [facility];  Service: Endoscopy;  Laterality: N/A;  1:30 PM   CYST EXCISION  1959   tail bone   CYSTOSCOPY WITH FULGERATION N/A 08/20/2022   Procedure: CYSTOSCOPY WITH FULGERATION;  Surgeon: Crista Elliot, MD;  Location: [facility];  Service: Urology;  Laterality: N/A;  cysto clot evac   IR IMAGING GUIDED PORT INSERTION  09/25/2017  FAMILY HISTORY: Family History  Problem Relation Age of Onset   Diabetes Mother    Stroke Mother    Hypertension Father     ADVANCED DIRECTIVES (Y/N):  N  HEALTH MAINTENANCE: Social History   Tobacco Use   Smoking status: Never   Smokeless tobacco: Never  Vaping Use   Vaping status: Never Used   Substance Use Topics   Alcohol use: No    Comment: " Not much no more"   Drug use: No     Colonoscopy:  PAP:  Bone density:  Lipid panel:  Allergies  Allergen Reactions   Codeine Other (See Comments)    Headache   Morphine And Codeine Other (See Comments)    Makes him feel weird   Revlimid [Lenalidomide] Other (See Comments)    "Causes me to become weak"    Current Outpatient Medications  Medication Sig Dispense Refill   acetaminophen (TYLENOL) 500 MG tablet Take 1,000 mg by mouth daily.     acyclovir (ZOVIRAX) 400 MG tablet Take 1 tablet (400 mg total) by mouth 2 (two) times daily. 60 tablet 6   ALPRAZolam (XANAX) 1 MG tablet TAKE ONE TABLET (1 MG TOTAL) BY MOUTH THREE (THREE) TIMES DAILY AS NEEDED. FOR ANXIETY (Patient taking differently: Take 1 mg by mouth 3 (three) times daily as needed for anxiety or sleep.) 90 tablet 0   azithromycin (ZITHROMAX) 250 MG tablet 2 tabs poqday1, 1 tab poqday 2-5 6 tablet 0   CALCIUM-VITAMIN D PO Take 1 tablet by mouth 2 (two) times daily.      cyanocobalamin (VITAMIN B12) 500 MCG tablet Take 1 tablet (500 mcg total) by mouth daily.     dexamethasone (DECADRON) 4 MG tablet Take 1 tablet (4 mg total) by mouth every other day. 90 tablet 1   ELIQUIS 5 MG TABS tablet TAKE ONE TABLET BY MOUTH TWICE DAILY 60 tablet 1   Ferrous Sulfate (IRON PO) Take by mouth.     fluticasone (FLONASE) 50 MCG/ACT nasal spray PLACE 2 SPRAYS INTO BOTH NOSTRILS DAILY 16 g 6   folic acid (FOLVITE) 1 MG tablet Take 1 tablet (1 mg total) by mouth daily. 90 tablet 1   furosemide (LASIX) 20 MG tablet Take 1 tablet (20 mg total) by mouth daily as needed. (Patient taking differently: Take 20 mg by mouth every other day.) 90 tablet 1   gabapentin (NEURONTIN) 400 MG capsule Take 1 capsule (400 mg total) by mouth 3 (three) times daily. (Patient taking differently: Take 400 mg by mouth 2 (two) times daily.) 90 capsule 4   guaiFENesin-dextromethorphan (ROBITUSSIN DM) 100-10 MG/5ML  syrup Take 5 mLs by mouth every 4 (four) hours as needed for cough (chest congestion). 473 mL 0   Homeopathic Products (THERAWORX RELIEF EX) Apply topically as needed.     lidocaine-prilocaine (EMLA) cream Apply small amount over port one (1) hour prior to appointment. 30 g 0   montelukast (SINGULAIR) 10 MG tablet TAKE 1 TABLET BY MOUTH EVERY NIGHT AT BEDTIME 30 tablet 3   Oxycodone HCl 10 MG TABS Take 1 tablet (10 mg total) by mouth 3 (three) times daily as needed. Okay to take 1 or 2 tablets as needed. 90 tablet 0   sulfamethoxazole-trimethoprim (BACTRIM DS) 800-160 MG tablet Take 1 tablet by mouth 2 (two) times daily. 14 tablet 0   tamsulosin (FLOMAX) 0.4 MG CAPS capsule TAKE ONE CAPSULE BY MOUTH EVERY NIGHT ATBEDTIME 30 capsule 5   traZODone (DESYREL) 100 MG tablet TAKE THREE  TABLETS BY MOUTH EVERY NIGHT AT BEDTIME (Patient taking differently: Take 300 mg by mouth at bedtime.) 90 tablet 1   TURMERIC PO Take 1 tablet by mouth 2 (two) times daily.     metoprolol tartrate (LOPRESSOR) 25 MG tablet Take 0.5 tablets (12.5 mg total) by mouth 2 (two) times daily. 30 tablet 1   pomalidomide (POMALYST) 3 MG capsule Take 1 capsule (3 mg total) by mouth daily. Take for 21 days, then hold for 7 days. Repeat every 28 days. 21 capsule 0   No current facility-administered medications for this visit.   Facility-Administered Medications Ordered in Other Visits  Medication Dose Route Frequency Provider Last Rate Last Admin   heparin lock flush 100 unit/mL  500 Units Intravenous Once Jeralyn Ruths, MD        OBJECTIVE: Vitals:   09/29/22 1012  BP: 107/72  Pulse: (!) 115  Temp: 98.2 F (36.8 C)  SpO2: (!) 73%      Body mass index is 28.89 kg/m.    ECOG FS:1 - Symptomatic but completely ambulatory  General: Well-developed, well-nourished, no acute distress.  Sitting in a wheelchair. Eyes: Pink conjunctiva, anicteric sclera. HEENT: Normocephalic, moist mucous membranes. Lungs: No audible  wheezing or coughing. Heart: Regular rate and rhythm. Abdomen: Soft, nontender, no obvious distention. Musculoskeletal: No edema, cyanosis, or clubbing. Neuro: Alert, answering all questions appropriately. Cranial nerves grossly intact. Skin: No rashes or petechiae noted. Psych: Normal affect.   LAB RESULTS:  Lab Results  Component Value Date   NA 134 (L) 09/29/2022   K 3.6 09/29/2022   CL 108 09/29/2022   CO2 22 09/29/2022   GLUCOSE 123 (H) 09/29/2022   BUN 25 (H) 09/29/2022   CREATININE 0.58 (L) 09/29/2022   CALCIUM 8.0 (L) 09/29/2022   PROT 6.6 09/29/2022   ALBUMIN 2.4 (L) 09/29/2022   AST 16 09/29/2022   ALT 12 09/29/2022   ALKPHOS 28 (L) 09/29/2022   BILITOT 0.3 09/29/2022   GFRNONAA >60 09/29/2022   GFRAA >60 10/13/2019    Lab Results  Component Value Date   WBC 7.5 09/29/2022   NEUTROABS 5.2 09/29/2022   HGB 10.5 (L) 09/29/2022   HCT 34.3 (L) 09/29/2022   MCV 97.7 09/29/2022   PLT 126 (L) 09/29/2022     STUDIES: No results found.  ASSESSMENT: Multiple myeloma  PLAN:  Multiple myeloma: Please see note from September 17, 2020 for historic details of his myeloma and treatment.  Since reinitiating daratumumab, patient's M spike trended down to 3.6, but then increased back to 5.3.  He was switched to single agent Pomalyst and his most recent M spike is stable at 1.3.  IgG and kappa free light chains are now trending down. We previously discussed discontinuing treatment and hospice, but patient wishes to pursue Pomalyst.  He expressed understanding that there are likely no other treatments available if Pomalyst were no longer working.  Patient has been instructed to reinitiate Pomalyst at 3 mg daily for 21 days with 7 days off.  Patient received Zometa approximately 2 weeks ago.  Return to clinic in 4 weeks for repeat laboratory work, further evaluation, and continuation of treatment.  Appreciate clinical pharmacy input. Back/joint pain: Chronic and unchanged.  Patient  is receiving XRT to his right hip/pelvis.  MRI results reviewed independently consistent with progressive myeloma.  No pathologic fractures reported.  Continue current narcotics as prescribed.    Peripheral neuropathy: Chronic and unchanged.  Continue gabapentin as prescribed. Insomnia/anxiety: Chronic and unchanged.  Patient is now having his medications adjusted by primary care.   Anemia: Hemoglobin improved to 10.5. Hypocalcemia: Given patient's low albumin this corrects to near normal.  Proceed with Zometa as above. Pulmonary embolism: Patient is now on Eliquis.  Patient also confirms DNR/DNI.   I spent a total of 30 minutes reviewing chart data, face-to-face evaluation with the patient, counseling and coordination of care as detailed above.    Patient expressed understanding and was in agreement with this plan. He also understands that He can call clinic at any time with any questions, concerns, or complaints.    Jeralyn Ruths, MD   09/29/2022 1:53 PM

## 2022-09-29 NOTE — Progress Notes (Signed)
Virtual Visit via Telephone Note  I connected with Dean [name]. on 09/29/22 at  1:20 PM EDT by telephone and verified that I am speaking with the correct person using two identifiers.  Location: Patient: Home Provider: Clinic   I discussed the limitations, risks, security and privacy concerns of performing an evaluation and management service by telephone and the availability of in person appointments. I also discussed with the patient that there may be a patient responsible charge related to this service. The patient expressed understanding and agreed to proceed.   History of Present Illness: Dean [name]. is a 87 y.o. male with multiple medical problems including multiple myeloma with disease progression currently on treatment.  Patient has had significant pain and declining performance status.  He has had recurrent falls including previous hip fracture.  Palliative care was consulted to address goals.    Observations/Objective: I called and spoke with patient by phone.   Patient says that he is doing a bit better since we last spoke.  He still living at his daughter's house but says he is trying to become more physically active.  He saw Dr. Orlie Dakin this morning with plan to increase dose of Pomalyst.  Patient would be interested in establishing Dean [name] to assist with rehab screening.   Reportedly, pain is reasonably well-controlled.  Uses heating pack and takes as needed oxycodone.  Assessment and Plan: Multiple myeloma -saw Dr. Orlie Dakin with plan to increase dose of Pomalyst.  Neoplasm related pain - well controlled. Continue as needed oxycodone.   Weakness -rehab screening  Follow Up Instructions: Follow up telephone visit in 2-3 months   I discussed the assessment and treatment plan with the patient. The patient was provided an opportunity to ask questions and all were answered. The patient agreed with the plan and demonstrated an understanding of the  instructions.   The patient was advised to call back or seek an in-person evaluation if the symptoms worsen or if the condition fails to improve as anticipated.  I provided 5 minutes of non-face-to-face time during this encounter.   Malachy Moan, NP

## 2022-09-29 NOTE — Progress Notes (Signed)
Patient has been weak since last hospital visit & swelling in feet. Requesting something for sleep.

## 2022-09-29 NOTE — Progress Notes (Signed)
Oral Chemotherapy Clinic [facility]  [phone] [phone]  Patient Care Team: Donita Brooks, MD as PCP - General (Family Medicine) Antony Blackbird, MD as Consulting Physician (Radiation Oncology) West Bali, MD (Inactive) as Consulting Physician (Gastroenterology) Karie Soda, MD as Consulting Physician (General Surgery) Jeralyn Ruths, MD as Consulting Physician (Oncology)   Name of the patient: Dean [name]  [id number]  [birth year]   Date of visit: 09/29/22  HPI: Patient is a 87 y.o. male with multiple myeloma, currently treated with pomalidomide. Started in December 2023.  Reason for Consult: Oral chemotherapy follow-up for pomalidomide therapy.   PAST MEDICAL HISTORY: Past Medical History:  Diagnosis Date   BPH (benign prostatic hyperplasia)    Colonic diverticular abscess 01/08/2015   Diverticulitis 12/22/2014   complicated by abscess and required percutaneous drainage   GERD (gastroesophageal reflux disease)    H/O ETOH abuse    History of chemotherapy last done jan 2017   History of radiation therapy 01/05/13-02/10/13   45 gray to left occipital condyle region   History of radiation therapy 12/01/16-12/10/16   Parasternal nodule, chest- 24 Gy total delivered in 8 fractions, Left sacro-iliac, pelvis- 24 Gy total delivered in 8 fractions    History of radiation therapy 02/19/17-02/22/17   right temporal scalp 30 Gy in 10 fractions   History of radiation therapy    Right hip( Pelvis) 11/18/21-11/21/21-Dr. Antony Blackbird   Intra-abdominal abscess [facility])    Multiple myeloma (HCC) 2015   Neuropathy    Radiation 02/21/14-03/08/14   right posterior chest wall area 30 gray   Radiation 04/19/14-05/02/14   lumbar spine 25 gray   Skull lesion    Left occipital condyle   Wrist fracture, left    x 2    HEMATOLOGY/ONCOLOGY HISTORY:  Oncology History  Multiple myeloma without remission (HCC)  11/16/2012 Initial Diagnosis   L  occipital condyle destructive lesion, not biopsied secondary to location. XRT   12/01/2012 Imaging   L3 superior endplate compression FX, no lytic lesions to suggest myeloma   02/06/2014 Imaging   soft tissue mass along the right posterior fifth rib with some rib destruction   02/16/2014 Miscellaneous   Normal CBC, Normal CMP, kappa,lamda ratio of 2.62 (abnormal), UIEP with slightly restricted band, IgG kappa, SPEP/IEP with monoclonal protein at 0.79 g/dl, normal igG, suppressed IGM   02/20/2014 Bone Marrow Biopsy   33% kappa restricted plasma cells Normal FISH, normal cytogenetics   02/21/2014 - 03/08/2014 Radiation Therapy   30Gy to R rib lesion, lesion not biopsied   03/16/2014 PET scan   Scattered hypermetabolic osseous lesions, including the calvarium, ribs, left scapula, sacrum, and left femoral shaft. An expansile left vertebral body lesion at L3 extends into the epidural space of the left lateral recess,    03/17/2014 - 04/07/2014 Chemotherapy   Velcade and Dexamethasone due to initial denial of Revlimid by insurance company.  Zometa monthly.   03/22/2014 Imaging   MR_ L-Spine- Enhancing lesions compatible with multiple myeloma at L2, L3, and S1-2.   04/07/2014 - 07/28/2014 Chemotherapy   RVD with Revlimid at 25 mg days 1-14.  Revlimid dose reduced to 25 mg every other day x 14 days with 7 day respite beginning on day 1 of cycle 2.   04/17/2014 Adverse Reaction   Revlimid-induced rash.  Treated with steroids.   07/28/2014 - 08/04/2014 Chemotherapy   Revlimid daily   08/04/2014 Adverse Reaction   Velcade-induced peripheral neuropathy   08/04/2014 Treatment Plan Change  D/C Velcade   08/11/2014 PET scan   Response to therapy. Improvement and resolution of foci of osseous hypermetabolism.   08/22/2014 - 08/28/2014 Chemotherapy   Revlimid 25 mg days 1-21 every 28 days   08/28/2014 Adverse Reaction   Revlimid-induced rash. Medication held.  Medrol dose Pak prescribed.   09/04/2014 - 01/08/2015  Chemotherapy   Revlimid 10 mg every other day, without dexamethasone   01/08/2015 - 01/15/2015 Hospital Admission   Colonic diverticular abscess with IR drain placement   03/02/2015 PET scan   Only bony uptake is low activity at site of deformity/callus at R second rib FX, high activity in sigmoid colon, advanced sigmoid divertidulosis. Abscess not resolved?   06/04/2015 Imaging   CT nonobstructive L renal calculus, diverticulosis of descending and sigmoid colon without inflammation, moderate prostatic enlargement   07/12/2015 Surgery   Diverticulitis s/p robotic sigmoid colectomy with Dr. Michaell Cowing   08/23/2015 PET scan   Primarily similar hypermetabolic osseous foci within the R sided ribs. new L third rib focus of hypermetab and sclerosis is favored to be related to healing FX. no soft tissue myeloma id'd. Presumed postop hypermetab and edema about the R pelvic wall    03/06/2016 PET scan   1. Reduced activity in the prior lesion such as the right second and fifth rib lesions, activity nearly completely resolved and significantly less than added mediastinal blood pool activity. No new lesions are identified. 2. Other imaging findings of potential clinical significance: Coronary, aortic arch, and branch vessel atherosclerotic vascular disease. Aortoiliac atherosclerotic vascular disease. Enlarged prostate gland. Colonic diverticula.   02/17/2017 PET scan   HEAD/NECK: No hypermetabolic activity in the scalp. No hypermetabolic cervical lymph nodes. CHEST: No hypermetabolic mediastinal or hilar nodes. Right upper lobe scarring/atelectasis. No suspicious pulmonary nodules on the CT scan. ABDOMEN/PELVIS: No abnormal hypermetabolic activity within the liver, pancreas, adrenal glands, or spleen. No hypermetabolic lymph nodes in the abdomen or pelvis. Atherosclerotic calcifications of the abdominal aorta and branch vessels. Left renal sinus cysts. Sigmoid diverticulosis, without evidence of  diverticulitis. Prostatomegaly. SKELETON: Vague hypermetabolism involving the inferior sternum, max SUV 3.2, previously 8.2. Focal hypermetabolism involving the left sacrum, max SUV 4.0, previously 6.7. EXTREMITIES: No abnormal hypermetabolic activity in the lower extremities.   02/18/2017 -  Chemotherapy   Bortezomib 1.3mg /m2 QWk + Dexamethasone 10mg  QTue/Fri --Cycle #1, 02/18/17    02/18/2017 - 08/26/2017 Chemotherapy   The patient had bortezomib SQ (VELCADE) chemo injection 3 mg, 1.3 mg/m2 = 3 mg, Subcutaneous,  Once, 7 of 9 cycles Administration: 3 mg (02/18/2017), 3 mg (02/26/2017), 3 mg (03/04/2017), 3 mg (03/11/2017), 3 mg (04/08/2017), 3 mg (03/18/2017), 3 mg (03/25/2017), 3 mg (04/01/2017), 3 mg (05/06/2017), 3 mg (05/20/2017), 3 mg (06/03/2017), 3 mg (06/17/2017), 3 mg (07/01/2017), 3 mg (07/15/2017), 3 mg (07/29/2017), 3 mg (08/12/2017), 3 mg (08/26/2017)  for chemotherapy treatment.    09/17/2017 - 10/02/2021 Chemotherapy   Patient is on Treatment Plan : MYELOMA Daratumumab q28d     09/17/2017 - 12/04/2021 Chemotherapy   Patient is on Treatment Plan : MYELOMA Daratumumab SQ q28d       ALLERGIES:  is allergic to codeine, morphine and codeine, and revlimid [lenalidomide].  MEDICATIONS:  Current Outpatient Medications  Medication Sig Dispense Refill   acetaminophen (TYLENOL) 500 MG tablet Take 1,000 mg by mouth daily.     acyclovir (ZOVIRAX) 400 MG tablet Take 1 tablet (400 mg total) by mouth 2 (two) times daily. 60 tablet 6   ALPRAZolam (XANAX) 1 MG tablet  TAKE ONE TABLET (1 MG TOTAL) BY MOUTH THREE (THREE) TIMES DAILY AS NEEDED. FOR ANXIETY (Patient taking differently: Take 1 mg by mouth 3 (three) times daily as needed for anxiety or sleep.) 90 tablet 0   azithromycin (ZITHROMAX) 250 MG tablet 2 tabs poqday1, 1 tab poqday 2-5 6 tablet 0   CALCIUM-VITAMIN D PO Take 1 tablet by mouth 2 (two) times daily.      cyanocobalamin (VITAMIN B12) 500 MCG tablet Take 1 tablet (500 mcg total) by mouth daily.      dexamethasone (DECADRON) 4 MG tablet Take 1 tablet (4 mg total) by mouth every other day. 90 tablet 1   ELIQUIS 5 MG TABS tablet TAKE ONE TABLET BY MOUTH TWICE DAILY 60 tablet 1   Ferrous Sulfate (IRON PO) Take by mouth.     fluticasone (FLONASE) 50 MCG/ACT nasal spray PLACE 2 SPRAYS INTO BOTH NOSTRILS DAILY 16 g 6   folic acid (FOLVITE) 1 MG tablet Take 1 tablet (1 mg total) by mouth daily. 90 tablet 1   furosemide (LASIX) 20 MG tablet Take 1 tablet (20 mg total) by mouth daily as needed. (Patient taking differently: Take 20 mg by mouth every other day.) 90 tablet 1   gabapentin (NEURONTIN) 400 MG capsule Take 1 capsule (400 mg total) by mouth 3 (three) times daily. (Patient taking differently: Take 400 mg by mouth 2 (two) times daily.) 90 capsule 4   guaiFENesin-dextromethorphan (ROBITUSSIN DM) 100-10 MG/5ML syrup Take 5 mLs by mouth every 4 (four) hours as needed for cough (chest congestion). 473 mL 0   Homeopathic Products (THERAWORX RELIEF EX) Apply topically as needed.     lidocaine-prilocaine (EMLA) cream Apply small amount over port one (1) hour prior to appointment. 30 g 0   metoprolol tartrate (LOPRESSOR) 25 MG tablet Take 0.5 tablets (12.5 mg total) by mouth 2 (two) times daily. 30 tablet 1   montelukast (SINGULAIR) 10 MG tablet TAKE 1 TABLET BY MOUTH EVERY NIGHT AT BEDTIME 30 tablet 3   Oxycodone HCl 10 MG TABS Take 1 tablet (10 mg total) by mouth 3 (three) times daily as needed. Okay to take 1 or 2 tablets as needed. 90 tablet 0   pomalidomide (POMALYST) 4 MG capsule Take 1 capsule (4 mg total) by mouth daily. Take for 21 days, then hold for 7 days. Repeat every 28 days. (Patient not taking: Reported on 09/29/2022) 21 capsule 0   sulfamethoxazole-trimethoprim (BACTRIM DS) 800-160 MG tablet Take 1 tablet by mouth 2 (two) times daily. 14 tablet 0   tamsulosin (FLOMAX) 0.4 MG CAPS capsule TAKE ONE CAPSULE BY MOUTH EVERY NIGHT ATBEDTIME 30 capsule 5   traZODone (DESYREL) 100 MG tablet TAKE  THREE TABLETS BY MOUTH EVERY NIGHT AT BEDTIME (Patient taking differently: Take 300 mg by mouth at bedtime.) 90 tablet 1   TURMERIC PO Take 1 tablet by mouth 2 (two) times daily.     No current facility-administered medications for this visit.   Facility-Administered Medications Ordered in Other Visits  Medication Dose Route Frequency Provider Last Rate Last Admin   heparin lock flush 100 unit/mL  500 Units Intravenous Once Jeralyn Ruths, MD        VITAL SIGNS: There were no vitals taken for this visit. There were no vitals filed for this visit.  Estimated body mass index is 28.89 kg/m as calculated from the following:   Height as of 09/16/22: 5\' 8"  (1.727 m).   Weight as of an earlier encounter on 09/29/22:  86.2 kg (190 lb).  LABS: CBC:    Component Value Date/Time   WBC 7.5 09/29/2022 0949   HGB 10.5 (L) 09/29/2022 0949   HCT 34.3 (L) 09/29/2022 0949   PLT 126 (L) 09/29/2022 0949   MCV 97.7 09/29/2022 0949   NEUTROABS 5.2 09/29/2022 0949   LYMPHSABS 1.7 09/29/2022 0949   MONOABS 0.6 09/29/2022 0949   EOSABS 0.0 09/29/2022 0949   BASOSABS 0.0 09/29/2022 0949   Comprehensive Metabolic Panel:    Component Value Date/Time   NA 134 (L) 09/29/2022 0949   K 3.6 09/29/2022 0949   CL 108 09/29/2022 0949   CO2 22 09/29/2022 0949   BUN 25 (H) 09/29/2022 0949   CREATININE 0.58 (L) 09/29/2022 0949   CREATININE 0.51 (L) 03/20/2022 0914   CREATININE 0.81 03/26/2021 1117   GLUCOSE 123 (H) 09/29/2022 0949   CALCIUM 8.0 (L) 09/29/2022 0949   AST 16 09/29/2022 0949   AST 18 03/20/2022 0914   ALT 12 09/29/2022 0949   ALT 13 03/20/2022 0914   ALKPHOS 28 (L) 09/29/2022 0949   BILITOT 0.3 09/29/2022 0949   BILITOT 0.4 03/20/2022 0914   PROT 6.6 09/29/2022 0949   ALBUMIN 2.4 (L) 09/29/2022 0949     Present during today's visit: patient and his daughter  Assessment and Plan: Patient improved enough to resume pomalidomide, will resume at 3 mg  No longer coughing, energy  improving slowly, he continues to work with PT at home   Oral Chemotherapy Side Effect/Intolerance:  Patient current off pomalidomide, but will resume as soon as he has medication in hand  Oral Chemotherapy Adherence: N/A, patient current off pomalidomide  New medications: no new meds reported  Medication Access Issues: No issues, patient fills pomalidomide at [facility]  Patient expressed understanding and was in agreement with this plan. He also understands that He can call clinic at any time with any questions, concerns, or complaints.   Follow-up plan: RTC in 4 weeks  Thank you for allowing me to participate in the care of this very pleasant patient.   Time Total: 15 mins  Visit consisted of counseling and education on dealing with issues of symptom management in the setting of serious and potentially life-threatening illness.Greater than 50%  of this time was spent counseling and coordinating care related to the above assessment and plan.  Signed by: Remi Haggard, PharmD, BCPS, Nolon Bussing, CPP Hematology/Oncology Clinical Pharmacist Practitioner [place]/[facility] Oral Chemotherapy [facility] [phone]  09/29/2022 11:42 AM

## 2022-09-30 LAB — IGG, IGA, IGM
IgA: 69 mg/dL (ref 61–437)
IgG (Immunoglobin G), Serum: 2452 mg/dL — ABNORMAL HIGH (ref 603–1613)
IgM (Immunoglobulin M), Srm: 35 mg/dL (ref 15–143)

## 2022-10-01 DIAGNOSIS — N401 Enlarged prostate with lower urinary tract symptoms: Secondary | ICD-10-CM | POA: Diagnosis not present

## 2022-10-01 DIAGNOSIS — J189 Pneumonia, unspecified organism: Secondary | ICD-10-CM | POA: Diagnosis not present

## 2022-10-01 DIAGNOSIS — R338 Other retention of urine: Secondary | ICD-10-CM | POA: Diagnosis not present

## 2022-10-01 DIAGNOSIS — J9601 Acute respiratory failure with hypoxia: Secondary | ICD-10-CM | POA: Diagnosis not present

## 2022-10-01 DIAGNOSIS — C9000 Multiple myeloma not having achieved remission: Secondary | ICD-10-CM | POA: Diagnosis not present

## 2022-10-01 DIAGNOSIS — D630 Anemia in neoplastic disease: Secondary | ICD-10-CM | POA: Diagnosis not present

## 2022-10-01 LAB — PROTEIN ELECTROPHORESIS, SERUM
A/G Ratio: 0.7 (ref 0.7–1.7)
Albumin ELP: 2.7 g/dL — ABNORMAL LOW (ref 2.9–4.4)
Alpha-1-Globulin: 0.3 g/dL (ref 0.0–0.4)
Alpha-2-Globulin: 0.7 g/dL (ref 0.4–1.0)
Beta Globulin: 0.8 g/dL (ref 0.7–1.3)
Gamma Globulin: 2 g/dL — ABNORMAL HIGH (ref 0.4–1.8)
Globulin, Total: 3.8 g/dL (ref 2.2–3.9)
M-Spike, %: 1.9 g/dL — ABNORMAL HIGH
Total Protein ELP: 6.5 g/dL (ref 6.0–8.5)

## 2022-10-01 LAB — KAPPA/LAMBDA LIGHT CHAINS
Kappa free light chain: 99.7 mg/L — ABNORMAL HIGH (ref 3.3–19.4)
Kappa, lambda light chain ratio: 15.83 — ABNORMAL HIGH (ref 0.26–1.65)
Lambda free light chains: 6.3 mg/L (ref 5.7–26.3)

## 2022-10-02 ENCOUNTER — Ambulatory Visit: Payer: Self-pay | Admitting: Radiation Oncology

## 2022-10-09 ENCOUNTER — Telehealth: Payer: Self-pay | Admitting: Family Medicine

## 2022-10-09 ENCOUNTER — Ambulatory Visit: Payer: Self-pay | Admitting: Radiation Oncology

## 2022-10-09 DIAGNOSIS — C9000 Multiple myeloma not having achieved remission: Secondary | ICD-10-CM | POA: Diagnosis not present

## 2022-10-09 DIAGNOSIS — N401 Enlarged prostate with lower urinary tract symptoms: Secondary | ICD-10-CM | POA: Diagnosis not present

## 2022-10-09 DIAGNOSIS — J189 Pneumonia, unspecified organism: Secondary | ICD-10-CM | POA: Diagnosis not present

## 2022-10-09 DIAGNOSIS — J9601 Acute respiratory failure with hypoxia: Secondary | ICD-10-CM | POA: Diagnosis not present

## 2022-10-09 DIAGNOSIS — D630 Anemia in neoplastic disease: Secondary | ICD-10-CM | POA: Diagnosis not present

## 2022-10-09 DIAGNOSIS — R338 Other retention of urine: Secondary | ICD-10-CM | POA: Diagnosis not present

## 2022-10-09 NOTE — Telephone Encounter (Signed)
Received call from Darlene with [facility] to report a fall which occurred today. Darlene currently doing home visit. Patient fell in living room. Patient is only walking short distances but stated he lost his balance and fell. Fire department had to come get him off the floor. Reportedly patient has a scratch on his back but refused to go to ED.   Darlene stated patient has fallen other times recently but she hasn't called to report them since she wasn't doing a home visit when patient fell. These falls reportedly occurred 10/07/22 and 10/05/22.  Darlene stated patient's daughter would like to try rehab for a couple of weeks to get patient back on his feet.   Please advise Darlene with any questions at [phone].

## 2022-10-10 DIAGNOSIS — N401 Enlarged prostate with lower urinary tract symptoms: Secondary | ICD-10-CM | POA: Diagnosis not present

## 2022-10-10 DIAGNOSIS — C9000 Multiple myeloma not having achieved remission: Secondary | ICD-10-CM | POA: Diagnosis not present

## 2022-10-10 DIAGNOSIS — J189 Pneumonia, unspecified organism: Secondary | ICD-10-CM | POA: Diagnosis not present

## 2022-10-10 DIAGNOSIS — D630 Anemia in neoplastic disease: Secondary | ICD-10-CM | POA: Diagnosis not present

## 2022-10-10 DIAGNOSIS — R338 Other retention of urine: Secondary | ICD-10-CM | POA: Diagnosis not present

## 2022-10-10 DIAGNOSIS — J9601 Acute respiratory failure with hypoxia: Secondary | ICD-10-CM | POA: Diagnosis not present

## 2022-10-16 ENCOUNTER — Telehealth: Payer: Self-pay | Admitting: Family Medicine

## 2022-10-16 DIAGNOSIS — R338 Other retention of urine: Secondary | ICD-10-CM | POA: Diagnosis not present

## 2022-10-16 DIAGNOSIS — D630 Anemia in neoplastic disease: Secondary | ICD-10-CM | POA: Diagnosis not present

## 2022-10-16 DIAGNOSIS — N401 Enlarged prostate with lower urinary tract symptoms: Secondary | ICD-10-CM | POA: Diagnosis not present

## 2022-10-16 DIAGNOSIS — C9000 Multiple myeloma not having achieved remission: Secondary | ICD-10-CM | POA: Diagnosis not present

## 2022-10-16 DIAGNOSIS — J189 Pneumonia, unspecified organism: Secondary | ICD-10-CM | POA: Diagnosis not present

## 2022-10-16 DIAGNOSIS — J9601 Acute respiratory failure with hypoxia: Secondary | ICD-10-CM | POA: Diagnosis not present

## 2022-10-16 NOTE — Telephone Encounter (Signed)
Called and spoke with daughter she states she needed Dr Tanya Nones to admit patient to hospital. Patient's daughter was informed no PCP has privileges to admit patients. Patient most go through the ED. Patient's daughter verbalized understanding.

## 2022-10-16 NOTE — Telephone Encounter (Signed)
Patient daughter called and left a vm asking for Dr. Tanya Nones to please call her. She would like to have her dad admitted so that he can be placed for rehab. She states that they can't place him in rehab until he has been admitted to the hospital for 3 days.  CB# [phone]

## 2022-10-17 ENCOUNTER — Other Ambulatory Visit: Payer: Self-pay

## 2022-10-17 ENCOUNTER — Emergency Department: Payer: Medicare Other

## 2022-10-17 ENCOUNTER — Inpatient Hospital Stay
Admission: EM | Admit: 2022-10-17 | Discharge: 2022-10-24 | DRG: 871 | Disposition: A | Discharge status: Skilled Nursing Facility | Payer: Medicare Other | Attending: Internal Medicine | Admitting: Internal Medicine

## 2022-10-17 DIAGNOSIS — E872 Acidosis, unspecified: Secondary | ICD-10-CM | POA: Diagnosis present

## 2022-10-17 DIAGNOSIS — I2510 Atherosclerotic heart disease of native coronary artery without angina pectoris: Secondary | ICD-10-CM | POA: Diagnosis present

## 2022-10-17 DIAGNOSIS — R531 Weakness: Principal | ICD-10-CM

## 2022-10-17 DIAGNOSIS — R58 Hemorrhage, not elsewhere classified: Secondary | ICD-10-CM | POA: Diagnosis not present

## 2022-10-17 DIAGNOSIS — Z7901 Long term (current) use of anticoagulants: Secondary | ICD-10-CM | POA: Diagnosis not present

## 2022-10-17 DIAGNOSIS — R609 Edema, unspecified: Secondary | ICD-10-CM | POA: Diagnosis not present

## 2022-10-17 DIAGNOSIS — R339 Retention of urine, unspecified: Secondary | ICD-10-CM | POA: Diagnosis not present

## 2022-10-17 DIAGNOSIS — I5033 Acute on chronic diastolic (congestive) heart failure: Secondary | ICD-10-CM | POA: Diagnosis present

## 2022-10-17 DIAGNOSIS — K219 Gastro-esophageal reflux disease without esophagitis: Secondary | ICD-10-CM | POA: Diagnosis present

## 2022-10-17 DIAGNOSIS — J9859 Other diseases of mediastinum, not elsewhere classified: Secondary | ICD-10-CM | POA: Diagnosis not present

## 2022-10-17 DIAGNOSIS — L03113 Cellulitis of right upper limb: Secondary | ICD-10-CM | POA: Diagnosis not present

## 2022-10-17 DIAGNOSIS — R52 Pain, unspecified: Secondary | ICD-10-CM | POA: Diagnosis not present

## 2022-10-17 DIAGNOSIS — M79604 Pain in right leg: Secondary | ICD-10-CM | POA: Diagnosis not present

## 2022-10-17 DIAGNOSIS — Z79899 Other long term (current) drug therapy: Secondary | ICD-10-CM

## 2022-10-17 DIAGNOSIS — Z823 Family history of stroke: Secondary | ICD-10-CM

## 2022-10-17 DIAGNOSIS — Z923 Personal history of irradiation: Secondary | ICD-10-CM | POA: Diagnosis not present

## 2022-10-17 DIAGNOSIS — D61810 Antineoplastic chemotherapy induced pancytopenia: Secondary | ICD-10-CM | POA: Diagnosis present

## 2022-10-17 DIAGNOSIS — T451X5A Adverse effect of antineoplastic and immunosuppressive drugs, initial encounter: Secondary | ICD-10-CM | POA: Diagnosis present

## 2022-10-17 DIAGNOSIS — N390 Urinary tract infection, site not specified: Secondary | ICD-10-CM | POA: Diagnosis present

## 2022-10-17 DIAGNOSIS — Z833 Family history of diabetes mellitus: Secondary | ICD-10-CM

## 2022-10-17 DIAGNOSIS — Z8249 Family history of ischemic heart disease and other diseases of the circulatory system: Secondary | ICD-10-CM

## 2022-10-17 DIAGNOSIS — G4700 Insomnia, unspecified: Secondary | ICD-10-CM | POA: Diagnosis present

## 2022-10-17 DIAGNOSIS — E876 Hypokalemia: Secondary | ICD-10-CM | POA: Diagnosis not present

## 2022-10-17 DIAGNOSIS — M1711 Unilateral primary osteoarthritis, right knee: Secondary | ICD-10-CM | POA: Diagnosis not present

## 2022-10-17 DIAGNOSIS — G7249 Other inflammatory and immune myopathies, not elsewhere classified: Secondary | ICD-10-CM | POA: Diagnosis not present

## 2022-10-17 DIAGNOSIS — Z86711 Personal history of pulmonary embolism: Secondary | ICD-10-CM

## 2022-10-17 DIAGNOSIS — A4154 Sepsis due to Acinetobacter baumannii: Secondary | ICD-10-CM | POA: Diagnosis not present

## 2022-10-17 DIAGNOSIS — T7840XA Allergy, unspecified, initial encounter: Secondary | ICD-10-CM | POA: Diagnosis not present

## 2022-10-17 DIAGNOSIS — I509 Heart failure, unspecified: Secondary | ICD-10-CM | POA: Diagnosis not present

## 2022-10-17 DIAGNOSIS — R7881 Bacteremia: Secondary | ICD-10-CM | POA: Diagnosis not present

## 2022-10-17 DIAGNOSIS — I272 Pulmonary hypertension, unspecified: Secondary | ICD-10-CM | POA: Diagnosis present

## 2022-10-17 DIAGNOSIS — Z8579 Personal history of other malignant neoplasms of lymphoid, hematopoietic and related tissues: Secondary | ICD-10-CM | POA: Diagnosis not present

## 2022-10-17 DIAGNOSIS — I77819 Aortic ectasia, unspecified site: Secondary | ICD-10-CM | POA: Diagnosis not present

## 2022-10-17 DIAGNOSIS — M19071 Primary osteoarthritis, right ankle and foot: Secondary | ICD-10-CM | POA: Diagnosis not present

## 2022-10-17 DIAGNOSIS — E669 Obesity, unspecified: Secondary | ICD-10-CM | POA: Diagnosis not present

## 2022-10-17 DIAGNOSIS — J90 Pleural effusion, not elsewhere classified: Secondary | ICD-10-CM | POA: Diagnosis not present

## 2022-10-17 DIAGNOSIS — N401 Enlarged prostate with lower urinary tract symptoms: Secondary | ICD-10-CM | POA: Diagnosis present

## 2022-10-17 DIAGNOSIS — R2681 Unsteadiness on feet: Secondary | ICD-10-CM | POA: Diagnosis not present

## 2022-10-17 DIAGNOSIS — D72819 Decreased white blood cell count, unspecified: Secondary | ICD-10-CM | POA: Diagnosis not present

## 2022-10-17 DIAGNOSIS — I4819 Other persistent atrial fibrillation: Secondary | ICD-10-CM | POA: Diagnosis present

## 2022-10-17 DIAGNOSIS — I480 Paroxysmal atrial fibrillation: Secondary | ICD-10-CM | POA: Diagnosis not present

## 2022-10-17 DIAGNOSIS — Z1152 Encounter for screening for COVID-19: Secondary | ICD-10-CM

## 2022-10-17 DIAGNOSIS — N179 Acute kidney failure, unspecified: Secondary | ICD-10-CM | POA: Diagnosis not present

## 2022-10-17 DIAGNOSIS — A419 Sepsis, unspecified organism: Secondary | ICD-10-CM | POA: Diagnosis not present

## 2022-10-17 DIAGNOSIS — I10 Essential (primary) hypertension: Secondary | ICD-10-CM | POA: Diagnosis not present

## 2022-10-17 DIAGNOSIS — C9000 Multiple myeloma not having achieved remission: Secondary | ICD-10-CM | POA: Diagnosis present

## 2022-10-17 DIAGNOSIS — F411 Generalized anxiety disorder: Secondary | ICD-10-CM | POA: Diagnosis not present

## 2022-10-17 DIAGNOSIS — I959 Hypotension, unspecified: Secondary | ICD-10-CM | POA: Diagnosis present

## 2022-10-17 DIAGNOSIS — R059 Cough, unspecified: Secondary | ICD-10-CM | POA: Diagnosis not present

## 2022-10-17 DIAGNOSIS — R0989 Other specified symptoms and signs involving the circulatory and respiratory systems: Secondary | ICD-10-CM | POA: Diagnosis not present

## 2022-10-17 DIAGNOSIS — M6259 Muscle wasting and atrophy, not elsewhere classified, multiple sites: Secondary | ICD-10-CM | POA: Diagnosis not present

## 2022-10-17 DIAGNOSIS — Z888 Allergy status to other drugs, medicaments and biological substances status: Secondary | ICD-10-CM

## 2022-10-17 DIAGNOSIS — R6520 Severe sepsis without septic shock: Secondary | ICD-10-CM | POA: Diagnosis not present

## 2022-10-17 DIAGNOSIS — I6782 Cerebral ischemia: Secondary | ICD-10-CM | POA: Diagnosis not present

## 2022-10-17 DIAGNOSIS — F419 Anxiety disorder, unspecified: Secondary | ICD-10-CM | POA: Diagnosis not present

## 2022-10-17 DIAGNOSIS — Z66 Do not resuscitate: Secondary | ICD-10-CM | POA: Diagnosis present

## 2022-10-17 DIAGNOSIS — M25561 Pain in right knee: Secondary | ICD-10-CM | POA: Diagnosis not present

## 2022-10-17 DIAGNOSIS — R918 Other nonspecific abnormal finding of lung field: Secondary | ICD-10-CM | POA: Diagnosis not present

## 2022-10-17 DIAGNOSIS — Z885 Allergy status to narcotic agent status: Secondary | ICD-10-CM

## 2022-10-17 DIAGNOSIS — F32A Depression, unspecified: Secondary | ICD-10-CM | POA: Diagnosis not present

## 2022-10-17 DIAGNOSIS — Z452 Encounter for adjustment and management of vascular access device: Secondary | ICD-10-CM | POA: Diagnosis not present

## 2022-10-17 DIAGNOSIS — E569 Vitamin deficiency, unspecified: Secondary | ICD-10-CM | POA: Diagnosis not present

## 2022-10-17 LAB — BRAIN NATRIURETIC PEPTIDE: B Natriuretic Peptide: 430.1 pg/mL — ABNORMAL HIGH (ref 0.0–100.0)

## 2022-10-17 LAB — CBC WITH DIFFERENTIAL/PLATELET
Abs Immature Granulocytes: 0.04 10*3/uL (ref 0.00–0.07)
Basophils Absolute: 0 10*3/uL (ref 0.0–0.1)
Basophils Relative: 1 %
Eosinophils Absolute: 0 10*3/uL (ref 0.0–0.5)
Eosinophils Relative: 0 %
HCT: 36.4 % — ABNORMAL LOW (ref 39.0–52.0)
Hemoglobin: 11.3 g/dL — ABNORMAL LOW (ref 13.0–17.0)
Immature Granulocytes: 2 %
Lymphocytes Relative: 40 %
Lymphs Abs: 0.8 10*3/uL (ref 0.7–4.0)
MCH: 30.3 pg (ref 26.0–34.0)
MCHC: 31 g/dL (ref 30.0–36.0)
MCV: 97.6 fL (ref 80.0–100.0)
Monocytes Absolute: 0.3 10*3/uL (ref 0.1–1.0)
Monocytes Relative: 17 %
Neutro Abs: 0.8 10*3/uL — ABNORMAL LOW (ref 1.7–7.7)
Neutrophils Relative %: 40 %
Platelets: 93 10*3/uL — ABNORMAL LOW (ref 150–400)
RBC: 3.73 MIL/uL — ABNORMAL LOW (ref 4.22–5.81)
RDW: 18 % — ABNORMAL HIGH (ref 11.5–15.5)
Smear Review: NORMAL
WBC: 2 10*3/uL — ABNORMAL LOW (ref 4.0–10.5)
nRBC: 0 % (ref 0.0–0.2)

## 2022-10-17 LAB — RESP PANEL BY RT-PCR (FLU A&B, COVID) ARPGX2
Influenza A by PCR: NEGATIVE
Influenza B by PCR: NEGATIVE
SARS Coronavirus 2 by RT PCR: NEGATIVE

## 2022-10-17 LAB — COMPREHENSIVE METABOLIC PANEL
ALT: 12 U/L (ref 0–44)
AST: 16 U/L (ref 15–41)
Albumin: 2.3 g/dL — ABNORMAL LOW (ref 3.5–5.0)
Alkaline Phosphatase: 17 U/L — ABNORMAL LOW (ref 38–126)
Anion gap: 8 (ref 5–15)
BUN: 22 mg/dL (ref 8–23)
CO2: 25 mmol/L (ref 22–32)
Calcium: 7.7 mg/dL — ABNORMAL LOW (ref 8.9–10.3)
Chloride: 102 mmol/L (ref 98–111)
Creatinine, Ser: 0.6 mg/dL — ABNORMAL LOW (ref 0.61–1.24)
GFR, Estimated: >60 mL/min (ref >60)
Glucose, Bld: 85 mg/dL (ref 70–99)
Potassium: 3.8 mmol/L (ref 3.5–5.1)
Sodium: 135 mmol/L (ref 135–145)
Total Bilirubin: 0.7 mg/dL (ref 0.3–1.2)
Total Protein: 6 g/dL — ABNORMAL LOW (ref 6.5–8.1)

## 2022-10-17 LAB — TROPONIN I (HIGH SENSITIVITY): Troponin I (High Sensitivity): 12 ng/L (ref <18)

## 2022-10-17 LAB — LACTIC ACID, PLASMA
Lactic Acid, Venous: 2.9 mmol/L (ref 0.5–1.9)
Lactic Acid, Venous: 2.6 mmol/L (ref 0.5–1.9)

## 2022-10-17 LAB — PROTIME-INR
INR: 1.5 — ABNORMAL HIGH (ref 0.8–1.2)
Prothrombin Time: 18.5 s — ABNORMAL HIGH (ref 11.4–15.2)

## 2022-10-17 LAB — APTT: aPTT: 28 s (ref 24–36)

## 2022-10-17 MED ORDER — VANCOMYCIN HCL 1500 MG/300ML IV SOLN
1500.0000 mg | Freq: Once | INTRAVENOUS | Status: AC
  Administered 2022-10-17: 1500 mg via INTRAVENOUS
  Filled 2022-10-17: qty 300

## 2022-10-17 MED ORDER — METOPROLOL TARTRATE 25 MG PO TABS
12.5000 mg | ORAL_TABLET | Freq: Two times a day (BID) | ORAL | Status: DC
  Administered 2022-10-18 – 2022-10-24 (×13): 12.5 mg via ORAL
  Filled 2022-10-17 (×14): qty 1

## 2022-10-17 MED ORDER — POMALIDOMIDE 3 MG PO CAPS: 3.0000 mg | ORAL_CAPSULE | Freq: Every day | ORAL | Status: DC

## 2022-10-17 MED ORDER — GUAIFENESIN-DM 100-10 MG/5ML PO SYRP: 5.0000 mL | ORAL_SOLUTION | ORAL | Status: DC | PRN

## 2022-10-17 MED ORDER — METOPROLOL TARTRATE 5 MG/5ML IV SOLN
2.5000 mg | Freq: Once | INTRAVENOUS | Status: AC
  Administered 2022-10-17: 2.5 mg via INTRAVENOUS
  Filled 2022-10-17: qty 5

## 2022-10-17 MED ORDER — SODIUM CHLORIDE 0.9 % IV SOLN: 2.0000 g | INTRAVENOUS | Status: DC

## 2022-10-17 MED ORDER — OXYCODONE HCL 5 MG PO TABS
5.0000 mg | ORAL_TABLET | Freq: Three times a day (TID) | ORAL | Status: DC | PRN
  Administered 2022-10-23: 5 mg via ORAL
  Filled 2022-10-17: qty 1

## 2022-10-17 MED ORDER — VANCOMYCIN HCL 500 MG/100ML IV SOLN
500.0000 mg | Freq: Once | INTRAVENOUS | Status: DC
  Filled 2022-10-17: qty 100

## 2022-10-17 MED ORDER — SODIUM CHLORIDE 0.9 % IV SOLN
2.0000 g | Freq: Once | INTRAVENOUS | Status: AC
  Administered 2022-10-17: 2 g via INTRAVENOUS
  Filled 2022-10-17: qty 20

## 2022-10-17 MED ORDER — ALPRAZOLAM 0.5 MG PO TABS: 0.5000 mg | ORAL_TABLET | Freq: Three times a day (TID) | ORAL | Status: DC | PRN

## 2022-10-17 MED ORDER — TAMSULOSIN HCL 0.4 MG PO CAPS
0.4000 mg | ORAL_CAPSULE | Freq: Every day | ORAL | Status: DC
  Administered 2022-10-18 – 2022-10-23 (×7): 0.4 mg via ORAL
  Filled 2022-10-17 (×7): qty 1

## 2022-10-17 MED ORDER — FLUTICASONE PROPIONATE 50 MCG/ACT NA SUSP: 2.0000 | Freq: Every day | NASAL | Status: DC

## 2022-10-17 MED ORDER — DIGOXIN 0.25 MG/ML IJ SOLN
0.1250 mg | Freq: Once | INTRAMUSCULAR | Status: AC
  Administered 2022-10-18: 0.125 mg via INTRAVENOUS
  Filled 2022-10-17: qty 2

## 2022-10-17 MED ORDER — MONTELUKAST SODIUM 10 MG PO TABS
10.0000 mg | ORAL_TABLET | Freq: Every day | ORAL | Status: DC
  Administered 2022-10-18 – 2022-10-23 (×7): 10 mg via ORAL
  Filled 2022-10-17 (×7): qty 1

## 2022-10-17 MED ORDER — TRAZODONE HCL 100 MG PO TABS
300.0000 mg | ORAL_TABLET | Freq: Every day | ORAL | Status: DC
  Administered 2022-10-18 – 2022-10-23 (×7): 300 mg via ORAL
  Filled 2022-10-17 (×7): qty 3

## 2022-10-17 MED ORDER — SODIUM CHLORIDE 0.9 % IV BOLUS
500.0000 mL | Freq: Once | INTRAVENOUS | Status: AC
  Administered 2022-10-17: 500 mL via INTRAVENOUS

## 2022-10-17 MED ORDER — OXYCODONE HCL 5 MG PO TABS
10.0000 mg | ORAL_TABLET | Freq: Three times a day (TID) | ORAL | Status: DC | PRN
  Administered 2022-10-18 – 2022-10-24 (×8): 10 mg via ORAL
  Filled 2022-10-17 (×8): qty 2

## 2022-10-17 MED ORDER — VANCOMYCIN HCL 750 MG/150ML IV SOLN
750.0000 mg | Freq: Two times a day (BID) | INTRAVENOUS | Status: DC
  Filled 2022-10-17: qty 150

## 2022-10-17 MED ORDER — FUROSEMIDE 10 MG/ML IJ SOLN: 20.0000 mg | Freq: Two times a day (BID) | INTRAMUSCULAR | Status: DC

## 2022-10-17 MED ORDER — DEXAMETHASONE 4 MG PO TABS
4.0000 mg | ORAL_TABLET | ORAL | Status: DC
  Administered 2022-10-18 – 2022-10-20 (×2): 4 mg via ORAL
  Filled 2022-10-17 (×2): qty 1

## 2022-10-17 MED ORDER — METOPROLOL TARTRATE 5 MG/5ML IV SOLN: 5.0000 mg | INTRAVENOUS | Status: DC | PRN

## 2022-10-17 MED ORDER — APIXABAN 5 MG PO TABS
5.0000 mg | ORAL_TABLET | Freq: Two times a day (BID) | ORAL | Status: DC
  Administered 2022-10-18 – 2022-10-20 (×5): 5 mg via ORAL
  Filled 2022-10-17 (×6): qty 1

## 2022-10-17 MED ORDER — ACETAMINOPHEN 500 MG PO TABS
1000.0000 mg | ORAL_TABLET | Freq: Every day | ORAL | Status: DC | PRN
  Administered 2022-10-20 – 2022-10-22 (×3): 1000 mg via ORAL
  Filled 2022-10-17 (×3): qty 2

## 2022-10-17 MED ORDER — GABAPENTIN 400 MG PO CAPS
400.0000 mg | ORAL_CAPSULE | Freq: Two times a day (BID) | ORAL | Status: DC
  Administered 2022-10-18 – 2022-10-24 (×14): 400 mg via ORAL
  Filled 2022-10-17 (×14): qty 1

## 2022-10-17 NOTE — ED Notes (Signed)
Dinner brought to pt. Family at bedside is going to help feed pt.

## 2022-10-17 NOTE — ED Provider Notes (Addendum)
[facility] Provider Note    Event Date/Time   First MD Initiated Contact with Patient 10/17/22 1014     (approximate)   History   Weakness   HPI  Dean [name]. is a 87 y.o. male who on review of oncology note has a history of multiple myeloma with significant decline in performance status   Reviewed notes including telephone call where an there is mention of potentially needing to be placed for rehabilitation.  Additionally note that there is mention that the patient has had recent falls including September 1 September 3 and potentially on September 5 as well  Has a history of prior PE, encephalopathy SVT respiratory failure  Patient also has a history of recent gross hematuria.  History of acute respiratory failure in July.  Physical Exam   Triage Vital Signs: ED Triage Vitals  Encounter Vitals Group     BP      Systolic BP Percentile      Diastolic BP Percentile      Pulse      Resp      Temp      Temp src      SpO2      Weight      Height      Head Circumference      Peak Flow      Pain Score      Pain Loc      Pain Education      Exclude from Growth Chart     Most recent vital signs: Vitals:   10/17/22 1402 10/17/22 1410  BP: 103/64   Pulse: (!) 109   Resp:    Temp:  97.7 F (36.5 C)  SpO2: 96%      General: Awake, no distress.  Normocephalic atraumatic.  Fully alert well-oriented.  No neck pain or discomfort to palpation  CV:  Good peripheral perfusion.  Mild tachycardia, somewhat irregular, appears to be A-fib on telemetry Resp:  Normal effort.  Clear bilateral normal work of breathing Abd:  No distention.  Soft nontender nondistended throughout Other:  Right forearm with a area of edema mid right forearm without obvious deformity, suspect hematoma.  Also small overlying skin tear.  There is ecchymosis, also warm to touch and quite red to inspection.  Patient reports area to be mildly sore   ED Results /  Procedures / Treatments   Labs (all labs ordered are listed, but only abnormal results are displayed) Labs Reviewed  LACTIC ACID, PLASMA - Abnormal; Notable for the following components:      Result Value   Lactic Acid, Venous 2.9 (*)    All other components within normal limits  LACTIC ACID, PLASMA - Abnormal; Notable for the following components:   Lactic Acid, Venous 2.6 (*)    All other components within normal limits  CBC WITH DIFFERENTIAL/PLATELET - Abnormal; Notable for the following components:   WBC 2.0 (*)    RBC 3.73 (*)    Hemoglobin 11.3 (*)    HCT 36.4 (*)    RDW 18.0 (*)    Platelets 93 (*)    Neutro Abs 0.8 (*)    All other components within normal limits  BRAIN NATRIURETIC PEPTIDE - Abnormal; Notable for the following components:   B Natriuretic Peptide 430.1 (*)    All other components within normal limits  COMPREHENSIVE METABOLIC PANEL - Abnormal; Notable for the following components:   Creatinine, Ser 0.60 (*)  Calcium 7.7 (*)    Total Protein 6.0 (*)    Albumin 2.3 (*)    Alkaline Phosphatase 17 (*)    All other components within normal limits  PROTIME-INR - Abnormal; Notable for the following components:   Prothrombin Time 18.5 (*)    INR 1.5 (*)    All other components within normal limits  RESP PANEL BY RT-PCR (FLU A&B, COVID) ARPGX2  CULTURE, BLOOD (ROUTINE X 2)  CULTURE, BLOOD (ROUTINE X 2)  APTT  URINALYSIS, W/ REFLEX TO CULTURE (INFECTION SUSPECTED)  TROPONIN I (HIGH SENSITIVITY)     EKG  ED interpreted by me at 1020 heart rate 115 QRS 100 QTc 400 Atrial fibrillation with rapid ventricular response   RADIOLOGY  DG Forearm Right  Result Date: 10/17/2022 CLINICAL DATA:  Sepsis.  Laceration.  History of myeloma. EXAM: RIGHT FOREARM - 2 VIEW COMPARISON:  None Available. FINDINGS: No acute fracture or dislocation. Preserved adjacent joint spaces. Vascular calcifications. There are subcortical lucencies along the midshaft of the radius and  ulna consistent with the patient's history of multiple myeloma. IMPRESSION: No acute osseous abnormality. Presumed myelomatous lesions involving the midshaft of the radius and ulna. Electronically Signed   By: Karen Kays M.D.   On: 10/17/2022 13:14   DG Chest Port 1 View  Result Date: 10/17/2022 CLINICAL DATA:  Questionable sepsis EXAM: PORTABLE CHEST 1 VIEW COMPARISON:  X-ray 08/23/2022 FINDINGS: Enlarged heart. Tortuous and ectatic aorta with stable widening of the mediastinum. Small pleural effusions with some adjacent opacity, left-greater-than-right. Increasing on the left compared to previous. Is also increasing vascular congestion and interstitial thickening. Old left-sided rib fractures. Osteopenia. Overlapping cardiac leads. Stable right IJ chest port with tip at the central SVC. IMPRESSION: Chest port. Enlarged heart with increasing vascular congestion and some interstitial changes. Increasing left retrocardiac opacity.  Persistent small effusions. Recommend follow-up Electronically Signed   By: Karen Kays M.D.   On: 10/17/2022 13:13   CT Head Wo Contrast  Result Date: 10/17/2022 CLINICAL DATA:  Provided history: Facial trauma, blunt. Head trauma. Multiple recent falls (on anticoagulation). Weakness. Additional history obtained from electronic MEDICAL RECORD NUMBERHistory of multiple myeloma. EXAM: CT HEAD WITHOUT CONTRAST TECHNIQUE: Contiguous axial images were obtained from the base of the skull through the vertex without intravenous contrast. RADIATION DOSE REDUCTION: This exam was performed according to the departmental dose-optimization program which includes automated exposure control, adjustment of the mA and/or kV according to patient size and/or use of iterative reconstruction technique. COMPARISON:  Brain MRI 09/06/2018.  CT angiogram head 11/29/2012. FINDINGS: Brain: Generalized cerebral atrophy. Patchy and ill-defined hypoattenuation within the cerebral white matter, nonspecific but  compatible with mild chronic small vessel ischemic disease. There is no acute intracranial hemorrhage. No demarcated cortical infarct. No extra-axial fluid collection. No evidence of an intracranial mass. No midline shift. Vascular: No hyperdense vessel.  Atherosclerotic calcifications. Skull: No calvarial fracture. Known previously treated lesion within the left occipital calvarium. Fairly numerous lucent lesions scattered elsewhere within the bilateral calvarium, which are new from the prior head CT of 11/29/2012. Additionally, there is a new 14 mm expansile/destructive osseous lesion (with soft tissue components) in the anterior aspect of the right zygomatic arch, encroaching upon the lateral aspect of the right maxillary sinus (series 3, image 9). Sinuses/Orbits: No orbital mass or acute orbital finding. Mild mucosal thickening, and small fluid level, within the right sphenoid sinus. Partial opacification of the right sphenoethmoidal recess. IMPRESSION: 1. No evidence of an acute intracranial abnormality. 2.  Mild chronic small vessel ischemic changes within the cerebral white matter. 3. Generalized cerebral atrophy. 4. Known previously treated lesion within the left occipital calvarium. 5. Fairly numerous lucent lesions scattered elsewhere within the bilateral calvarium which are new from the prior head CT of 11/29/2012. Additionally, there is a new 14 mm expansile/destructive osseous lesion (with soft tissue components) in the anterior aspect of the right zygomatic arch, encroaching upon the lateral aspect of the right maxillary sinus. These new lesions likely reflect sequelae of multiple myeloma given the patient's history. 6. Right sphenoid sinusitis. Electronically Signed   By: Jackey Loge D.O.   On: 10/17/2022 13:07      PROCEDURES:  Critical Care performed: Yes, see critical care procedure note(s)  CRITICAL CARE Performed by: Sharyn Creamer   Total critical care time: 35 minutes  Critical care  time was exclusive of separately billable procedures and treating other patients.  Critical care was necessary to treat or prevent imminent or life-threatening deterioration.  Critical care was time spent personally by me on the following activities: development of treatment plan with patient and/or surrogate as well as nursing, discussions with consultants, evaluation of patient's response to treatment, examination of patient, obtaining history from patient or surrogate, ordering and performing treatments and interventions, ordering and review of laboratory studies, ordering and review of radiographic studies, pulse oximetry and re-evaluation of patient's condition.   Procedures   MEDICATIONS ORDERED IN ED: Medications  vancomycin (VANCOREADY) IVPB 1500 mg/300 mL (has no administration in time range)  cefTRIAXone (ROCEPHIN) 2 g in sodium chloride 0.9 % 100 mL IVPB (0 g Intravenous Stopped 10/17/22 1253)  sodium chloride 0.9 % bolus 500 mL (0 mLs Intravenous Stopped 10/17/22 1253)  metoprolol tartrate (LOPRESSOR) injection 2.5 mg (2.5 mg Intravenous Given 10/17/22 1217)     IMPRESSION / MDM / ASSESSMENT AND PLAN / ED COURSE  I reviewed the triage vital signs and the nursing notes.                              Differential diagnosis includes, but is not limited to, generalized fatigue, metabolic abnormality dehydration anemia, infection including a high concern for sepsis over the right forearm with hematoma, no evidence of specific compartment involved or compartment syndrome, warm well-perfused right hand with normal pulse.  He does not endorse any pain or discomfort anywhere other than he is notices a little bit sore over his right forearm.  Suspect this occurred after a fall as is also a small skin tear in the area.  He has a history of multiple falls, takes anticoagulants CT of the head ordered to evaluate for intracranial causation though no focal neurologic deficit or obvious trauma is  noted.  Await further workup, also reviewed notes, it seems the patient primary care has been attempting to place him.  I did send a [facility] message to his    Patient's presentation is most consistent with acute presentation with potential threat to life or bodily function.   The patient is on the cardiac monitor to evaluate for evidence of arrhythmia and/or significant heart rate changes.   Clinical Course as of 10/17/22 1558  Fri Oct 17, 2022  1350 Nursing, Morrie Sheldon, working to obtain chemistry sample.  Was delayed [MQ]  1350 Patient and his daughter in the room, patient resting comfortably.  He is agreeable with plan for admission.  Receiving dose of Rocephin for cellulitis.  Notably also leukopenic [MQ]  Clinical Course User Index [MQ] Sharyn Creamer, MD   Patient identified to have concerns for developing cellulitis around the right forearm.  Empiric antibiotics including Rocephin and then later with review of the patient's significant leukopenia vancomycin for additional coverage ordered.  Urinalysis pending at this time, the patient does not have evidence of shock or lactic acid greater than 4.  However, does have evidence of sepsis with low white count elevated heart rate.  Chest imaging with evidence of vascular congestion but patient with no oxygen requirement or respiratory symptoms at this time.  Will continue to follow via hospitalist.  Consolidation is noted on his checks x-ray, however reviewing old imaging I suspect there is a significant chronic component of this portion and clinically doubt acute pneumonia at this time, is difficult to exclude and he will be initiating antibiotic therapy  Discussed with patient and family agreeable with plan for admission.  Consulted with patient accepted to hospital service by Dr. Chipper Herb  FINAL CLINICAL IMPRESSION(S) / ED DIAGNOSES   Final diagnoses:  Weakness  Cellulitis of right arm  Sepsis, due to unspecified organism, unspecified whether  acute organ dysfunction present [facility])     Rx / DC Orders   ED Discharge Orders     None        Note:  This document was prepared using Dragon voice recognition software and may include unintentional dictation errors.   Sharyn Creamer, MD 10/17/22 1558    Sharyn Creamer, MD 10/17/22 1558

## 2022-10-17 NOTE — ED Triage Notes (Signed)
Pt arrived via [facility] from home and lives with family. Pt c/o weakness for several days. EMS also noticed redness and swelling to the right forearm below the elbow. Pt has a hx of blood cancer. Pt is A&Ox4.

## 2022-10-17 NOTE — H&P (Signed)
History and Physical    Dean [name]. WUJ:[id number] DOB: 07-Nov-1935 DOA: 10/17/2022  PCP: Dean Brooks, MD (Confirm with patient/family/NH records and if not entered, this has to be entered at [facility] point of entry) Patient coming from: Home  I have personally briefly reviewed patient's old medical records in [facility]  Chief Complaint: Chills, right arm rash and pain  HPI: Dean [name]. is a 87 y.o. male with medical history significant of MM on pomalidomide, BPH with recurrent UTIs, chronic HFpEF, PAF on Eliquis, mild/moderate pulmonary hypertension, anxiety/depression, brought in by family member for evaluation of generalized weakness, right-sided arm rash and swelling and pain.  Symptoms started 2 to 3 days ago, patient gradually developed generalized weakness and family also reported the patient's urine smells strongly which concerned him about recurrent UTI.  Patient also reported subjective fever and night and chills.  Yesterday, patient sustained a skin abrasion on the right elbow and overnight she developed redness pain and swelling of right elbow and right forearm.  And this morning patient could not even standing on his feet due to severe weakness and tended to fall family reported the patient mentation appears to be at baseline and no confusion.  ED Course: Temperature 99.1, heart rate 110s, nonhypoxic blood pressure 100/60.  Imaging studies CT head negative for acute findings, chest x-ray showed cardiomegaly and increased pulmonary congestion, right arm x-ray showed no foreign bodies and no osseous abnormalities but chronic myelomatous lesions.  Blood work showed WBC 2.0, hemoglobin 11.3, creatinine 0.6, K3.8.  Code sepsis called and patient was given vancomycin and ceftriaxone in the ED.  Review of Systems: As per HPI otherwise 14 point review of systems negative.    Past Medical History:  Diagnosis Date   BPH (benign prostatic hyperplasia)    Colonic  diverticular abscess 01/08/2015   Diverticulitis 12/22/2014   complicated by abscess and required percutaneous drainage   GERD (gastroesophageal reflux disease)    H/O ETOH abuse    History of chemotherapy last done jan 2017   History of radiation therapy 01/05/13-02/10/13   45 gray to left occipital condyle region   History of radiation therapy 12/01/16-12/10/16   Parasternal nodule, chest- 24 Gy total delivered in 8 fractions, Left sacro-iliac, pelvis- 24 Gy total delivered in 8 fractions    History of radiation therapy 02/19/17-02/22/17   right temporal scalp 30 Gy in 10 fractions   History of radiation therapy    Right hip( Pelvis) 11/18/21-11/21/21-Dr. Antony [name]   Intra-abdominal abscess (HCC)    Multiple myeloma (HCC) 2015   Neuropathy    Radiation 02/21/14-03/08/14   right posterior chest wall area 30 gray   Radiation 04/19/14-05/02/14   lumbar spine 25 gray   Skull lesion    Left occipital condyle   Wrist fracture, left    x 2    Past Surgical History:  Procedure Laterality Date   COLONOSCOPY N/A 04/19/2015   Procedure: COLONOSCOPY;  Surgeon: Dean Bali, MD;  Location: [facility];  Service: Endoscopy;  Laterality: N/A;  1:30 PM   CYST EXCISION  1959   tail bone   CYSTOSCOPY WITH FULGERATION N/A 08/20/2022   Procedure: CYSTOSCOPY WITH FULGERATION;  Surgeon: Dean Elliot, MD;  Location: [facility] ORS;  Service: Urology;  Laterality: N/A;  cysto clot evac   IR IMAGING GUIDED PORT INSERTION  09/25/2017     reports that he has never smoked. He has never used smokeless tobacco. He reports that  he does not drink alcohol and does not use drugs.  Allergies  Allergen Reactions   Codeine Other (See Comments)    Headache   Morphine And Codeine Other (See Comments)    Makes him feel weird   Revlimid [Lenalidomide] Other (See Comments)    "Causes me to become weak"    Family History  Problem Relation Age of Onset   Diabetes Mother    Stroke Mother    Hypertension Father       Prior to Admission medications   Medication Sig Start Date End Date Taking? Authorizing Provider  acetaminophen (TYLENOL) 500 MG tablet Take 1,000 mg by mouth daily.    [provider]  acyclovir (ZOVIRAX) 400 MG tablet Take 1 tablet (400 mg total) by mouth 2 (two) times daily. 04/21/22   Dean Ruths, MD  ALPRAZolam (XANAX) 1 MG tablet TAKE ONE TABLET (1 MG TOTAL) BY MOUTH THREE (THREE) TIMES DAILY AS NEEDED. FOR ANXIETY Patient taking differently: Take 1 mg by mouth 3 (three) times daily as needed for anxiety or sleep. 07/28/22   [name], Dean Eastern, NP  azithromycin (ZITHROMAX) 250 MG tablet 2 tabs poqday1, 1 tab poqday 2-5 09/11/22   Dean Brooks, MD  CALCIUM-VITAMIN D PO Take 1 tablet by mouth 2 (two) times daily.     [provider]  cyanocobalamin (VITAMIN B12) 500 MCG tablet Take 1 tablet (500 mcg total) by mouth daily. 02/13/22   Dean Hartshorn, MD  dexamethasone (DECADRON) 4 MG tablet Take 1 tablet (4 mg total) by mouth every other day. 09/02/22   Dean Ruths, MD  ELIQUIS 5 MG TABS tablet TAKE ONE TABLET BY MOUTH TWICE DAILY 08/29/22   Dean Ruths, MD  Ferrous Sulfate (IRON PO) Take by mouth.    [provider]  fluticasone (FLONASE) 50 MCG/ACT nasal spray PLACE 2 SPRAYS INTO BOTH NOSTRILS DAILY 03/03/22   Dean Brooks, MD  folic acid (FOLVITE) 1 MG tablet Take 1 tablet (1 mg total) by mouth daily. 05/19/22   Dean Ruths, MD  furosemide (LASIX) 20 MG tablet Take 1 tablet (20 mg total) by mouth daily as needed. Patient taking differently: Take 20 mg by mouth every other day. 04/22/22   Dean Ruths, MD  gabapentin (NEURONTIN) 400 MG capsule Take 1 capsule (400 mg total) by mouth 3 (three) times daily. Patient taking differently: Take 400 mg by mouth 2 (two) times daily. 04/21/22   Dean Ruths, MD  guaiFENesin-dextromethorphan (ROBITUSSIN DM) 100-10 MG/5ML syrup Take 5 mLs by mouth every 4 (four) hours as needed for  cough (chest congestion). 08/24/22   Dean Ide, MD  Homeopathic Products [facility] RELIEF EX) Apply topically as needed.    [provider]  lidocaine-prilocaine (EMLA) cream Apply small amount over port one (1) hour prior to appointment. 09/17/19   Doreatha Massed, MD  metoprolol tartrate (LOPRESSOR) 25 MG tablet Take 0.5 tablets (12.5 mg total) by mouth 2 (two) times daily. 08/24/22 09/23/22  Dean Ide, MD  montelukast (SINGULAIR) 10 MG tablet Take 1 tablet (10 mg total) by mouth at bedtime. 09/30/22   Dean Brooks, MD  Oxycodone HCl 10 MG TABS Take 1 tablet (10 mg total) by mouth 3 (three) times daily as needed. Okay to take 1 or 2 tablets as needed. 09/25/22   Dean Ruths, MD  pomalidomide (POMALYST) 3 MG capsule Take 1 capsule (3 mg total) by mouth daily. Take for 21 days, then hold  for 7 days. Repeat every 28 days. 09/29/22   Dean Ruths, MD  sulfamethoxazole-trimethoprim (BACTRIM DS) 800-160 MG tablet Take 1 tablet by mouth 2 (two) times daily. 09/16/22   Dean Brooks, MD  tamsulosin (FLOMAX) 0.4 MG CAPS capsule TAKE ONE CAPSULE BY MOUTH EVERY NIGHT ATBEDTIME 08/29/22   Dean Brooks, MD  traZODone (DESYREL) 100 MG tablet TAKE THREE TABLETS BY MOUTH EVERY NIGHT AT BEDTIME Patient taking differently: Take 300 mg by mouth at bedtime. 07/28/22   [name], Dean Eastern, NP  TURMERIC PO Take 1 tablet by mouth 2 (two) times daily.    [provider]    Physical Exam: Vitals:   10/17/22 1600 10/17/22 1630 10/17/22 1700 10/17/22 1710  BP: 98/64 100/64 107/67   Pulse: (!) 114 (!) 107 (!) 109   Resp: 17 (!) 22 (!) 22   Temp:      TempSrc:      SpO2: 93% 92% 92%   Weight:    102.1 kg  Height:    5\' 8"  (1.727 m)    Constitutional: NAD, calm, comfortable Vitals:   10/17/22 1600 10/17/22 1630 10/17/22 1700 10/17/22 1710  BP: 98/64 100/64 107/67   Pulse: (!) 114 (!) 107 (!) 109   Resp: 17 (!) 22 (!) 22   Temp:      TempSrc:      SpO2: 93% 92%  92%   Weight:    102.1 kg  Height:    5\' 8"  (1.727 m)   Eyes: PERRL, lids and conjunctivae normal ENMT: Mucous membranes are moist. Posterior pharynx clear of any exudate or lesions.Normal dentition.  Neck: normal, supple, no masses, no thyromegaly Respiratory: clear to auscultation bilaterally, no wheezing, crackles to the mid level, increasing. respiratory effort. No accessory muscle use.  Cardiovascular: Irregular heart rate, tachycardia, no murmurs / rubs / gallops. 2+ extremity edema. 2+ pedal pulses. No carotid bruits.  Abdomen: no tenderness, no masses palpated. No hepatosplenomegaly. Bowel sounds positive.  Musculoskeletal: no clubbing / cyanosis. No joint deformity upper and lower extremities. Good ROM, no contractures. Normal muscle tone.  Skin: no rashes, lesions, ulcers. No induration Neurologic: CN 2-12 grossly intact. Sensation intact, DTR normal. Strength 5/5 in all 4.  Psychiatric: Normal judgment and insight. Alert and oriented x 3. Normal mood.     Labs on Admission: I have personally reviewed following labs and imaging studies  CBC: Recent Labs  Lab 10/17/22 1050  WBC 2.0*  NEUTROABS 0.8*  HGB 11.3*  HCT 36.4*  MCV 97.6  PLT 93*   Basic Metabolic Panel: Recent Labs  Lab 10/17/22 1352  NA 135  K 3.8  CL 102  CO2 25  GLUCOSE 85  BUN 22  CREATININE 0.60*  CALCIUM 7.7*   GFR: Estimated Creatinine Clearance: 78.2 mL/min (A) (by C-G formula based on SCr of 0.6 mg/dL (L)). Liver Function Tests: Recent Labs  Lab 10/17/22 1352  AST 16  ALT 12  ALKPHOS 17*  BILITOT 0.7  PROT 6.0*  ALBUMIN 2.3*   No results for input(s): "LIPASE", "AMYLASE" in the last 168 hours. No results for input(s): "AMMONIA" in the last 168 hours. Coagulation Profile: Recent Labs  Lab 10/17/22 1352  INR 1.5*   Cardiac Enzymes: No results for input(s): "CKTOTAL", "CKMB", "CKMBINDEX", "TROPONINI" in the last 168 hours. BNP (last 3 results) No results for input(s):  "PROBNP" in the last 8760 hours. HbA1C: No results for input(s): "HGBA1C" in the last 72 hours. CBG: No results for input(s): "  GLUCAP" in the last 168 hours. Lipid Profile: No results for input(s): "CHOL", "HDL", "LDLCALC", "TRIG", "CHOLHDL", "LDLDIRECT" in the last 72 hours. Thyroid Function Tests: No results for input(s): "TSH", "T4TOTAL", "FREET4", "T3FREE", "THYROIDAB" in the last 72 hours. Anemia Panel: No results for input(s): "VITAMINB12", "FOLATE", "FERRITIN", "TIBC", "IRON", "RETICCTPCT" in the last 72 hours. Urine analysis:    Component Value Date/Time   COLORURINE RED (A) 08/19/2022 1103   APPEARANCEUR TURBID (A) 08/19/2022 1103   LABSPEC  08/19/2022 1103    TEST NOT REPORTED DUE TO COLOR INTERFERENCE OF URINE PIGMENT   PHURINE  08/19/2022 1103    TEST NOT REPORTED DUE TO COLOR INTERFERENCE OF URINE PIGMENT   GLUCOSEU (A) 08/19/2022 1103    TEST NOT REPORTED DUE TO COLOR INTERFERENCE OF URINE PIGMENT   HGBUR (A) 08/19/2022 1103    TEST NOT REPORTED DUE TO COLOR INTERFERENCE OF URINE PIGMENT   BILIRUBINUR (A) 08/19/2022 1103    TEST NOT REPORTED DUE TO COLOR INTERFERENCE OF URINE PIGMENT   BILIRUBINUR negative 07/18/2021 0841   KETONESUR (A) 08/19/2022 1103    TEST NOT REPORTED DUE TO COLOR INTERFERENCE OF URINE PIGMENT   PROTEINUR (A) 08/19/2022 1103    TEST NOT REPORTED DUE TO COLOR INTERFERENCE OF URINE PIGMENT   UROBILINOGEN 0.2 07/18/2021 0841   UROBILINOGEN 0.2 01/02/2014 1103   NITRITE (A) 08/19/2022 1103    TEST NOT REPORTED DUE TO COLOR INTERFERENCE OF URINE PIGMENT   LEUKOCYTESUR (A) 08/19/2022 1103    TEST NOT REPORTED DUE TO COLOR INTERFERENCE OF URINE PIGMENT    Radiological Exams on Admission: DG Forearm Right  Result Date: 10/17/2022 CLINICAL DATA:  Sepsis.  Laceration.  History of myeloma. EXAM: RIGHT FOREARM - 2 VIEW COMPARISON:  None Available. FINDINGS: No acute fracture or dislocation. Preserved adjacent joint spaces. Vascular calcifications.  There are subcortical lucencies along the midshaft of the radius and ulna consistent with the patient's history of multiple myeloma. IMPRESSION: No acute osseous abnormality. Presumed myelomatous lesions involving the midshaft of the radius and ulna. Electronically Signed   By: Karen Kays M.D.   On: 10/17/2022 13:14   DG Chest Port 1 View  Result Date: 10/17/2022 CLINICAL DATA:  Questionable sepsis EXAM: PORTABLE CHEST 1 VIEW COMPARISON:  X-ray 08/23/2022 FINDINGS: Enlarged heart. Tortuous and ectatic aorta with stable widening of the mediastinum. Small pleural effusions with some adjacent opacity, left-greater-than-right. Increasing on the left compared to previous. Is also increasing vascular congestion and interstitial thickening. Old left-sided rib fractures. Osteopenia. Overlapping cardiac leads. Stable right IJ chest port with tip at the central SVC. IMPRESSION: Chest port. Enlarged heart with increasing vascular congestion and some interstitial changes. Increasing left retrocardiac opacity.  Persistent small effusions. Recommend follow-up Electronically Signed   By: Karen Kays M.D.   On: 10/17/2022 13:13   CT Head Wo Contrast  Result Date: 10/17/2022 CLINICAL DATA:  Provided history: Facial trauma, blunt. Head trauma. Multiple recent falls (on anticoagulation). Weakness. Additional history obtained from electronic MEDICAL RECORD NUMBERHistory of multiple myeloma. EXAM: CT HEAD WITHOUT CONTRAST TECHNIQUE: Contiguous axial images were obtained from the base of the skull through the vertex without intravenous contrast. RADIATION DOSE REDUCTION: This exam was performed according to the departmental dose-optimization program which includes automated exposure control, adjustment of the mA and/or kV according to patient size and/or use of iterative reconstruction technique. COMPARISON:  Brain MRI 09/06/2018.  CT angiogram head 11/29/2012. FINDINGS: Brain: Generalized cerebral atrophy. Patchy and ill-defined  hypoattenuation within the cerebral white  matter, nonspecific but compatible with mild chronic small vessel ischemic disease. There is no acute intracranial hemorrhage. No demarcated cortical infarct. No extra-axial fluid collection. No evidence of an intracranial mass. No midline shift. Vascular: No hyperdense vessel.  Atherosclerotic calcifications. Skull: No calvarial fracture. Known previously treated lesion within the left occipital calvarium. Fairly numerous lucent lesions scattered elsewhere within the bilateral calvarium, which are new from the prior head CT of 11/29/2012. Additionally, there is a new 14 mm expansile/destructive osseous lesion (with soft tissue components) in the anterior aspect of the right zygomatic arch, encroaching upon the lateral aspect of the right maxillary sinus (series 3, image 9). Sinuses/Orbits: No orbital mass or acute orbital finding. Mild mucosal thickening, and small fluid level, within the right sphenoid sinus. Partial opacification of the right sphenoethmoidal recess. IMPRESSION: 1. No evidence of an acute intracranial abnormality. 2. Mild chronic small vessel ischemic changes within the cerebral white matter. 3. Generalized cerebral atrophy. 4. Known previously treated lesion within the left occipital calvarium. 5. Fairly numerous lucent lesions scattered elsewhere within the bilateral calvarium which are new from the prior head CT of 11/29/2012. Additionally, there is a new 14 mm expansile/destructive osseous lesion (with soft tissue components) in the anterior aspect of the right zygomatic arch, encroaching upon the lateral aspect of the right maxillary sinus. These new lesions likely reflect sequelae of multiple myeloma given the patient's history. 6. Right sphenoid sinusitis. Electronically Signed   By: Jackey Loge D.O.   On: 10/17/2022 13:07    EKG: Independently reviewed.  A-fib with RVR  Assessment/Plan Principal Problem:   Sepsis (HCC) Active Problems:    Multiple myeloma without remission (HCC)   Sepsis due to cellulitis (HCC)   Leukopenia   Paroxysmal A-fib (HCC)  (please populate well all problems here in Problem List. (For example, if patient is on BP meds at home and you resume or decide to hold them, it is a problem that needs to be her. Same for CAD, COPD, HLD and so on)  Sepsis -Evidenced by leukopenia, elevated lactate source of infection is likely combined UTI and right arm cellulitis -Secondary to right arm cellulitis and a presumed UTI, given the patient's history of multiple myeloma with chronic immunosuppression, agreed with escalating antibiotics coverage with vancomycin and ceftriaxone for now. -Check MRSA screening and strep a PCR, may need to further consider coverage of Pseudomonas  Complicated UTI -As above -Check PVR  Right arm cellulitis -As above  A-fib with RVR -Likely secondary to sepsis -Given the patient also has signs of active CHF decompensation, decided to give 1 dose of digoxin. -Continue metoprolol 25 g p.o. twice daily -Add as needed Lopressor for breakthrough heart rate -CCB contraindicated as patient's blood pressure is low.  Acute on chronic HFpEF decompensation -Likely secondary to poorly controlled A-fib, management as above -Have to hold off Lasix today given the already low blood pressure reading through this afternoon from sepsis.  Recheck chest x-ray tomorrow, if blood pressure allows consider start IV diuresis. -Continue Eliquis  Recurrent UTI -Ceftriaxone for now  History of urethral rupture -Family reported that the patient urethra is fragile for straight cath and/or Foley, as he has a history of ureteral rupture on last admission when insertion of Estratest for UA collection resulted in the third rapture requiring prolonged hospital stay for urology repair.  Discussed with nurse of sending urine with a random sample but no straight cath.  Pancytopenia -Acute phase is likely to related  to sepsis, chronically patient  has MM on chronic chemotherapy -Sepsis management as above, repeat CBC tomorrow  MM -Continue pomalidomide -Outpatient follow-up with oncology -Continue dexamethasone  Anxiety/depression -Continue SSRI  DVT prophylaxis: Eliquis Code Status: DNR Family Communication: Wife at bedside Disposition Plan: Patient is sick with sepsis and CHF decompensation requiring IV antibiotics and further CHF management after resolution of sepsis, expect more than 2 midnight hospital Consults called: None Admission status: PCU   Emeline General MD [facility] Pager [phone]  10/17/2022, 5:36 PM

## 2022-10-17 NOTE — Sepsis Progress Note (Signed)
Sepsis protocol is being followed by [facility].

## 2022-10-17 NOTE — Progress Notes (Signed)
PHARMACY -  BRIEF ANTIBIOTIC NOTE   Pharmacy has received consult(s) for vancomycin from an ED provider.  The patient's profile has been reviewed for ht/wt/allergies/indication/available labs.    One time order(s) placed for vancomycin 1500 mg IV x 1  Further antibiotics/pharmacy consults should be ordered by admitting physician if indicated.                       Thank you, Lowella Bandy 10/17/2022  3:28 PM

## 2022-10-17 NOTE — Progress Notes (Signed)
Pharmacy Antibiotic Note  Dean [name]. is a 87 y.o. male admitted on 10/17/2022 with  wound infection .  Patient presenting with increased redness and swelling to right arm. In ED, patient is afebrile with LA 2.6 and WBC 2.0. Pharmacy has been consulted for vancomycin dosing.  Plan: Give vancomycin 500 mg IV x1 (to complete 2000 mg load in ED) Start vancomycin 750 mg IV every 12 hours (eAUC 501.3, Scr 0.8, Vd 0.5 L/kg) Continue ceftriaxone 2 g IV every 24 hours per MD Monitor renal function, clinical status, culture data, and LOT  Height: 5\' 8"  (172.7 cm) Weight: 102.1 kg (225 lb) IBW/kg (Calculated) : 68.4  Temp (24hrs), Avg:98.2 F (36.8 C), Min:97.7 F (36.5 C), Max:99.1 F (37.3 C)  Recent Labs  Lab 10/17/22 1050 10/17/22 1352  WBC 2.0*  --   CREATININE  --  0.60*  LATICACIDVEN 2.9* 2.6*    Estimated Creatinine Clearance: 78.2 mL/min (A) (by C-G formula based on SCr of 0.6 mg/dL (L)).    Allergies  Allergen Reactions   Codeine Other (See Comments)    Headache   Morphine And Codeine Other (See Comments)    Makes him feel weird   Revlimid [Lenalidomide] Other (See Comments)    "Causes me to become weak"   Antimicrobials this admission: ceftriaxone 9/13 >>  vancomycin 9/13 >>   Dose adjustments this admission: N/A  Microbiology results:  9/13 BCx: pending 9/13 MRSA PCR: pending  Thank you for involving pharmacy in this patient's care.   Rockwell Alexandria, PharmD Clinical Pharmacist 10/17/2022 6:26 PM

## 2022-10-18 ENCOUNTER — Inpatient Hospital Stay: Payer: Medicare Other

## 2022-10-18 ENCOUNTER — Encounter: Payer: Self-pay | Admitting: Internal Medicine

## 2022-10-18 DIAGNOSIS — I509 Heart failure, unspecified: Secondary | ICD-10-CM | POA: Diagnosis not present

## 2022-10-18 DIAGNOSIS — A419 Sepsis, unspecified organism: Secondary | ICD-10-CM

## 2022-10-18 DIAGNOSIS — L03113 Cellulitis of right upper limb: Secondary | ICD-10-CM | POA: Diagnosis not present

## 2022-10-18 DIAGNOSIS — J90 Pleural effusion, not elsewhere classified: Secondary | ICD-10-CM | POA: Diagnosis not present

## 2022-10-18 DIAGNOSIS — R531 Weakness: Secondary | ICD-10-CM

## 2022-10-18 DIAGNOSIS — R918 Other nonspecific abnormal finding of lung field: Secondary | ICD-10-CM | POA: Diagnosis not present

## 2022-10-18 DIAGNOSIS — R0989 Other specified symptoms and signs involving the circulatory and respiratory systems: Secondary | ICD-10-CM | POA: Diagnosis not present

## 2022-10-18 LAB — URINALYSIS, W/ REFLEX TO CULTURE (INFECTION SUSPECTED)
Bilirubin Urine: NEGATIVE
Glucose, UA: NEGATIVE mg/dL
Ketones, ur: 5 mg/dL — AB
Nitrite: POSITIVE — AB
Protein, ur: 30 mg/dL — AB
Specific Gravity, Urine: 1.015 (ref 1.005–1.030)
WBC, UA: >50 WBC/hpf (ref 0–5)
pH: 7 (ref 5.0–8.0)

## 2022-10-18 LAB — BASIC METABOLIC PANEL
Anion gap: 5 (ref 5–15)
BUN: 27 mg/dL — ABNORMAL HIGH (ref 8–23)
CO2: 24 mmol/L (ref 22–32)
Calcium: 7.3 mg/dL — ABNORMAL LOW (ref 8.9–10.3)
Chloride: 105 mmol/L (ref 98–111)
Creatinine, Ser: 0.62 mg/dL (ref 0.61–1.24)
GFR, Estimated: >60 mL/min (ref >60)
Glucose, Bld: 96 mg/dL (ref 70–99)
Potassium: 3.4 mmol/L — ABNORMAL LOW (ref 3.5–5.1)
Sodium: 134 mmol/L — ABNORMAL LOW (ref 135–145)

## 2022-10-18 LAB — CBC
HCT: 30.8 % — ABNORMAL LOW (ref 39.0–52.0)
Hemoglobin: 9.8 g/dL — ABNORMAL LOW (ref 13.0–17.0)
MCH: 30.2 pg (ref 26.0–34.0)
MCHC: 31.8 g/dL (ref 30.0–36.0)
MCV: 95.1 fL (ref 80.0–100.0)
Platelets: 74 10*3/uL — ABNORMAL LOW (ref 150–400)
RBC: 3.24 MIL/uL — ABNORMAL LOW (ref 4.22–5.81)
RDW: 17.6 % — ABNORMAL HIGH (ref 11.5–15.5)
WBC: 1.8 10*3/uL — ABNORMAL LOW (ref 4.0–10.5)
nRBC: 0 % (ref 0.0–0.2)

## 2022-10-18 LAB — BLOOD CULTURE ID PANEL (REFLEXED) - BCID2

## 2022-10-18 LAB — MRSA NEXT GEN BY PCR, NASAL: MRSA by PCR Next Gen: NOT DETECTED

## 2022-10-18 MED ORDER — POTASSIUM CHLORIDE CRYS ER 20 MEQ PO TBCR
40.0000 meq | EXTENDED_RELEASE_TABLET | Freq: Once | ORAL | Status: AC
  Administered 2022-10-18: 40 meq via ORAL
  Filled 2022-10-18: qty 2

## 2022-10-18 MED ORDER — INFLUENZA VAC A&B SURF ANT ADJ 0.5 ML IM SUSY
0.5000 mL | PREFILLED_SYRINGE | INTRAMUSCULAR | Status: DC
  Filled 2022-10-18: qty 0.5

## 2022-10-18 MED ORDER — SODIUM CHLORIDE 0.9 % IV SOLN
2.0000 g | Freq: Two times a day (BID) | INTRAVENOUS | Status: DC
  Administered 2022-10-18 – 2022-10-19 (×4): 2 g via INTRAVENOUS
  Filled 2022-10-18 (×4): qty 20

## 2022-10-18 NOTE — Progress Notes (Signed)
Prior-To-Admission Oral Chemotherapy for Treatment of Oncologic Disease   Order noted from Dr. Mikey College to continue prior-to-admission oral chemotherapy regimen of Pomalidomide.  Procedure Per Pharmacy & Therapeutics Committee Policy: Orders for continuation of home oral chemotherapy for treatment of an oncologic disease will be held unless approved by an oncologist during current admission.    For patients receiving oncology care at [facility], inpatient pharmacist contacts patient's oncologist during regular office hours to review. If earlier review is medically necessary, attending physician consults [facility] on-call oncologist   For patients receiving oncology care outside of [facility], attending physician consults patient's oncologist to review. If this oncologist or their coverage cannot be reached, attending physician consults [facility] on-call oncologist   Oral chemotherapy continuation order is on hold pending oncologist review, [facility] oncologist Dr. Orlie Dakin will be notified by inpatient pharmacy during office hours    9/14:  Dr. Orlie Dakin messaged via secure chat and is ok to hold patient's Pomalidomide currently   Bari Mantis PharmD Clinical Pharmacist 10/18/2022

## 2022-10-18 NOTE — Progress Notes (Signed)
Progress Note   Patient: Dean [name]. WUJ:[id number] DOB: [birth year] DOA: 10/17/2022     1 DOS: the patient was seen and examined on 10/18/2022     Subjective:  Patient seen and examined at bedside this morning He tells me he is feeling slightly better however continues to feel weak He denies nausea vomiting or abdominal pain Has pancytopenia in the setting of multiple myeloma  Brief hospital course: From HPI "Dean [name]. is a 87 y.o. male with medical history significant of MM on pomalidomide, BPH with recurrent UTIs, chronic HFpEF, PAF on Eliquis, mild/moderate pulmonary hypertension, anxiety/depression, brought in by family member for evaluation of generalized weakness, right-sided arm rash and swelling and pain.  Patient noted to have sepsis secondary to UTI as well as right arm cellulitis with diastolic heart failure exacerbation and therefore admitted for further management.  Assessment and Plan: Sepsis secondary to right arm cellulitis as well as urinary tract infection -Evidenced by leukopenia, with WBC 1.8 heart rate as high as 110 tachycardia elevated lactate source of infection is likely combined UTI and right arm cellulitis Follow-up on MRSA screen  Continue arm elevation Continue current antibiotics Follow-up on culture results   Complicated UTI -Continue management as above   Right arm cellulitis -Continue management as above   Potassium-continue potassium replacement and monitoring  Chronic persistent atrial fibrillation with rapid ventricular response -Likely secondary to sepsis -Given the patient also has signs of active CHF decompensation, decided to give 1 dose of digoxin. -Continue metoprolol 25 g p.o. twice daily -Add as needed Lopressor for breakthrough heart rate -CCB contraindicated as patient's blood pressure is low.   Acute on chronic HFpEF decompensation -Likely secondary to poorly controlled A-fib, management as above -Have to hold off  Lasix today given the already low blood pressure reading through this afternoon from sepsis.  Recheck chest x-ray tomorrow, if blood pressure allows consider start IV diuresis. -Continue Eliquis     History of urethral rupture -Family reported that the patient urethra is fragile for straight cath and/or Foley, as he has a history of ureteral rupture on last admission when insertion of foley for UA collection resulted in the third rapture requiring prolonged hospital stay for urology repair.  Discussed with nurse of sending urine with a random sample but no straight cath.   Pancytopenia likely secondary to multiple myeloma -Acute phase is likely to related to sepsis, chronically patient has MM on chronic chemotherapy Outpatient follow-up with oncologist Monitor CBC and transfuse as needed   Multiple myeloma -Continue pomalidomide -Outpatient follow-up with oncology -Continue dexamethasone   Anxiety/depression -Continue SSRI   DVT prophylaxis: Eliquis  Code Status: DNR  Family Communication: Wife at bedside  Disposition Plan: Patient is sick with sepsis and CHF decompensation requiring IV antibiotics and further CHF management after resolution of sepsis, expect more than 2 midnight hospital  Consults called: None    Physical Exam: Eyes: PERRL, lids and conjunctivae normal ENMT: Mucous membranes are moist.  Neck: normal, supple, no masses, no thyromegaly Respiratory: Decreased air entry especially at the bases Cardiovascular: Irregularly irregular Abdomen: no tenderness, no masses palpated.  Musculoskeletal: no clubbing / cyanosis.  Skin: no rashes, lesions, ulcers. No induration Neurologic: CN 2-12 grossly intact. Sensation intact Psychiatric: Normal judgment and insight.   Data Reviewed: I personally reviewed patient's previous admission documentation, I have also reviewed the patient's CBC and CMP results I have reviewed the ED physician as well as admitting provider  documentation  Vitals:  10/18/22 0949 10/18/22 1100 10/18/22 1530 10/18/22 1659  BP:  100/61 106/65 (!) 161/129  Pulse:  (!) 105 92 85  Resp:  19 (!) 6 18  Temp: 99 F (37.2 C)   97.8 F (36.6 C)  TempSrc: Oral   Oral  SpO2:  98% 97% 96%  Weight:      Height:            Latest Ref Rng & Units 10/18/2022    2:54 AM 10/17/2022   10:50 AM 09/29/2022    9:49 AM  CBC  WBC 4.0 - 10.5 K/uL 1.8  2.0  7.5   Hemoglobin 13.0 - 17.0 g/dL 9.8  16.1  09.6   Hematocrit 39.0 - 52.0 % 30.8  36.4  34.3   Platelets 150 - 400 K/uL 74  93  126        Latest Ref Rng & Units 10/18/2022    2:54 AM 10/17/2022    1:52 PM 09/29/2022    9:49 AM  CMP  Glucose 70 - 99 mg/dL 96  85  045   BUN 8 - 23 mg/dL 27  22  25    Creatinine 0.61 - 1.24 mg/dL 4.09  8.11  9.14   Sodium 135 - 145 mmol/L 134  135  134   Potassium 3.5 - 5.1 mmol/L 3.4  3.8  3.6   Chloride 98 - 111 mmol/L 105  102  108   CO2 22 - 32 mmol/L 24  25  22    Calcium 8.9 - 10.3 mg/dL 7.3  7.7  8.0   Total Protein 6.5 - 8.1 g/dL  6.0  6.6   Total Bilirubin 0.3 - 1.2 mg/dL  0.7  0.3   Alkaline Phos 38 - 126 U/L  17  28   AST 15 - 41 U/L  16  16   ALT 0 - 44 U/L  12  12        Author: Loyce Dys, MD 10/18/2022 5:25 PM  For on call review [email or web address].

## 2022-10-18 NOTE — Plan of Care (Signed)

## 2022-10-18 NOTE — Progress Notes (Addendum)
PHARMACY - PHYSICIAN COMMUNICATION CRITICAL VALUE ALERT - BLOOD CULTURE IDENTIFICATION (BCID)  Dean [name]. is an 87 y.o. male who presented to [facility] on 10/17/2022 with a chief complaint of sepsis  Assessment:  Gram neg cocci in 2 of 4 bottles, specimen sent to [facility] for further ID  (include suspected source if known).  Pt is immunocompromised.   Name of physician (or Provider) Contacted:  Manuela Schwartz, NP   Current antibiotics: Vanc, Ceftriaxone   Changes to prescribed antibiotics recommended:  Recommendations accepted by provider  Will d/c vanc and increase ceftriaxone dose to 2 gm IV Q12H.   F/U with AM med staff regarding dose.   Results for orders placed or performed during the hospital encounter of 10/17/22  Blood Culture ID Panel (Reflexed) (Collected: 10/17/2022 10:50 AM)  Result Value Ref Range   Enterococcus faecalis NOT DETECTED NOT DETECTED   Enterococcus Faecium NOT DETECTED NOT DETECTED   Listeria monocytogenes NOT DETECTED NOT DETECTED   Staphylococcus species NOT DETECTED NOT DETECTED   Staphylococcus aureus (BCID) NOT DETECTED NOT DETECTED   Staphylococcus epidermidis NOT DETECTED NOT DETECTED   Staphylococcus lugdunensis NOT DETECTED NOT DETECTED   Streptococcus species NOT DETECTED NOT DETECTED   Streptococcus agalactiae NOT DETECTED NOT DETECTED   Streptococcus pneumoniae NOT DETECTED NOT DETECTED   Streptococcus pyogenes NOT DETECTED NOT DETECTED   A.calcoaceticus-baumannii NOT DETECTED NOT DETECTED   Bacteroides fragilis NOT DETECTED NOT DETECTED   Enterobacterales NOT DETECTED NOT DETECTED   Enterobacter cloacae complex NOT DETECTED NOT DETECTED   Escherichia coli NOT DETECTED NOT DETECTED   Klebsiella aerogenes NOT DETECTED NOT DETECTED   Klebsiella oxytoca NOT DETECTED NOT DETECTED   Klebsiella pneumoniae NOT DETECTED NOT DETECTED   Proteus species NOT DETECTED NOT DETECTED   Salmonella species NOT DETECTED NOT DETECTED   Serratia  marcescens NOT DETECTED NOT DETECTED   Haemophilus influenzae NOT DETECTED NOT DETECTED   Neisseria meningitidis NOT DETECTED NOT DETECTED   Pseudomonas aeruginosa NOT DETECTED NOT DETECTED   Stenotrophomonas maltophilia NOT DETECTED NOT DETECTED   Candida albicans NOT DETECTED NOT DETECTED   Candida auris NOT DETECTED NOT DETECTED   Candida glabrata NOT DETECTED NOT DETECTED   Candida krusei NOT DETECTED NOT DETECTED   Candida parapsilosis NOT DETECTED NOT DETECTED   Candida tropicalis NOT DETECTED NOT DETECTED   Cryptococcus neoformans/gattii NOT DETECTED NOT DETECTED    Brittany Osier D 10/18/2022  12:26 AM

## 2022-10-19 DIAGNOSIS — R531 Weakness: Secondary | ICD-10-CM | POA: Diagnosis not present

## 2022-10-19 DIAGNOSIS — A419 Sepsis, unspecified organism: Secondary | ICD-10-CM | POA: Diagnosis not present

## 2022-10-19 DIAGNOSIS — L03113 Cellulitis of right upper limb: Secondary | ICD-10-CM | POA: Diagnosis not present

## 2022-10-19 LAB — CBC WITH DIFFERENTIAL/PLATELET
Abs Immature Granulocytes: 0.03 10*3/uL (ref 0.00–0.07)
Basophils Absolute: 0 10*3/uL (ref 0.0–0.1)
Basophils Relative: 0 %
Eosinophils Absolute: 0 10*3/uL (ref 0.0–0.5)
Eosinophils Relative: 0 %
HCT: 31 % — ABNORMAL LOW (ref 39.0–52.0)
Hemoglobin: 9.9 g/dL — ABNORMAL LOW (ref 13.0–17.0)
Immature Granulocytes: 2 %
Lymphocytes Relative: 29 %
Lymphs Abs: 0.6 10*3/uL — ABNORMAL LOW (ref 0.7–4.0)
MCH: 30.2 pg (ref 26.0–34.0)
MCHC: 31.9 g/dL (ref 30.0–36.0)
MCV: 94.5 fL (ref 80.0–100.0)
Monocytes Absolute: 0.5 10*3/uL (ref 0.1–1.0)
Monocytes Relative: 25 %
Neutro Abs: 0.9 10*3/uL — ABNORMAL LOW (ref 1.7–7.7)
Neutrophils Relative %: 44 %
Platelets: 69 10*3/uL — ABNORMAL LOW (ref 150–400)
RBC: 3.28 MIL/uL — ABNORMAL LOW (ref 4.22–5.81)
RDW: 16.9 % — ABNORMAL HIGH (ref 11.5–15.5)
Smear Review: NORMAL
WBC: 2 10*3/uL — ABNORMAL LOW (ref 4.0–10.5)
nRBC: 0 % (ref 0.0–0.2)

## 2022-10-19 LAB — BASIC METABOLIC PANEL
Anion gap: 5 (ref 5–15)
BUN: 22 mg/dL (ref 8–23)
CO2: 23 mmol/L (ref 22–32)
Calcium: 7.4 mg/dL — ABNORMAL LOW (ref 8.9–10.3)
Chloride: 107 mmol/L (ref 98–111)
Creatinine, Ser: 0.47 mg/dL — ABNORMAL LOW (ref 0.61–1.24)
GFR, Estimated: >60 mL/min (ref >60)
Glucose, Bld: 118 mg/dL — ABNORMAL HIGH (ref 70–99)
Potassium: 4.1 mmol/L (ref 3.5–5.1)
Sodium: 135 mmol/L (ref 135–145)

## 2022-10-19 MED ORDER — SODIUM CHLORIDE 0.9 % IV SOLN
2.0000 g | INTRAVENOUS | Status: DC
  Administered 2022-10-20: 2 g via INTRAVENOUS
  Filled 2022-10-19: qty 20

## 2022-10-19 MED ORDER — FUROSEMIDE 10 MG/ML IJ SOLN
20.0000 mg | Freq: Two times a day (BID) | INTRAMUSCULAR | Status: DC
  Administered 2022-10-19 – 2022-10-24 (×10): 20 mg via INTRAVENOUS
  Filled 2022-10-19 (×11): qty 2

## 2022-10-19 NOTE — Plan of Care (Signed)

## 2022-10-19 NOTE — Progress Notes (Signed)
Progress Note   Patient: Dean [name]. ZOX:[id number] DOB: [birth year] DOA: 10/17/2022     2 DOS: the patient was seen and examined on 10/19/2022      Subjective:  Patient seen and examined in the presence of the family Admits to improvement in the right upper extremity cellulitis Denies nausea vomiting abdominal pain chest pain or cough   Brief hospital course: From HPI "Reyn Toranzo. is a 87 y.o. male with medical history significant of MM on pomalidomide, BPH with recurrent UTIs, chronic HFpEF, PAF on Eliquis, mild/moderate pulmonary hypertension, anxiety/depression, brought in by family member for evaluation of generalized weakness, right-sided arm rash and swelling and pain.  Patient noted to have sepsis secondary to UTI as well as right arm cellulitis with diastolic heart failure exacerbation and therefore admitted for further management.   Assessment and Plan: Sepsis secondary to right arm cellulitis as well as urinary tract infection -Evidenced by leukopenia, with WBC 1.8 heart rate as high as 110 tachycardia elevated lactate source of infection is likely combined UTI and right arm cellulitis Follow-up on MRSA screen  Continue arm elevation Continue current antibiotics Follow-up on culture results   Complicated UTI Continue current antibiotics   Right arm cellulitis Continue management as above     Potassium-continue potassium replacement and monitoring   Chronic persistent atrial fibrillation with rapid ventricular response -Likely secondary to sepsis -Given the patient also has signs of active CHF decompensation, decided to give 1 dose of digoxin. -Continue metoprolol 25 g p.o. twice daily -Add as needed Lopressor for breakthrough heart rate -CCB contraindicated as patient's blood pressure is low. Continue Lasix Monitor daily weight Monitor input and output   Acute on chronic HFpEF decompensation -Likely secondary to poorly controlled A-fib, management as  above We have initiated Lasix today as blood pressure allows I discussed the risks and benefits of resuming Eliquis with patient as well as patient's daughter and her daughter has opted for Eliquis to be withheld at this time given the low platelet levels We will continue to monitor CBC closely     History of urethral rupture -Family reported that the patient urethra is fragile for straight cath and/or Foley, as he has a history of ureteral rupture on last admission when insertion of foley for UA collection resulted in the third rapture requiring prolonged hospital stay for urology repair.  Discussed with nurse of sending urine with a random sample but no straight cath.   Pancytopenia likely secondary to multiple myeloma -Acute phase is likely to related to sepsis, chronically patient has MM on chronic chemotherapy Outpatient follow-up with oncologist Monitor CBC and transfuse as needed   Multiple myeloma -Continue pomalidomide -Outpatient follow-up with oncology -Continue dexamethasone   Anxiety/depression -Continue SSRI   DVT prophylaxis: Eliquis   Code Status: DNR   Family Communication: Wife at bedside   Disposition Plan: Patient is sick with sepsis and CHF decompensation requiring IV antibiotics and further CHF management after resolution of sepsis, expect more than 2 midnight hospital   Consults called: None       Physical Exam: Eyes: PERRL, lids and conjunctivae normal ENMT: Mucous membranes are moist.  Neck: normal, supple, no masses, no thyromegaly Respiratory: Decreased air entry especially at the bases Cardiovascular: Irregularly irregular Abdomen: no tenderness, no masses palpated.  Musculoskeletal: no clubbing / cyanosis.  Skin: no rashes, lesions, ulcers. No induration Neurologic: CN 2-12 grossly intact. Sensation intact Psychiatric: Normal judgment and insight.    Data Reviewed: I have  reviewed patient's lab results including CBC, BMP I have also reviewed  patient's vitals I have discussed the case with transition of care manager and reviewed documentation.     Latest Ref Rng & Units 10/19/2022    3:20 AM 10/18/2022    2:54 AM 10/17/2022   10:50 AM  CBC  WBC 4.0 - 10.5 K/uL 2.0  1.8  2.0   Hemoglobin 13.0 - 17.0 g/dL 9.9  9.8  82.9   Hematocrit 39.0 - 52.0 % 31.0  30.8  36.4   Platelets 150 - 400 K/uL 69  74  93        Latest Ref Rng & Units 10/19/2022    3:20 AM 10/18/2022    2:54 AM 10/17/2022    1:52 PM  BMP  Glucose 70 - 99 mg/dL 562  96  85   BUN 8 - 23 mg/dL 22  27  22    Creatinine 0.61 - 1.24 mg/dL 1.30  8.65  7.84   Sodium 135 - 145 mmol/L 135  134  135   Potassium 3.5 - 5.1 mmol/L 4.1  3.4  3.8   Chloride 98 - 111 mmol/L 107  105  102   CO2 22 - 32 mmol/L 23  24  25    Calcium 8.9 - 10.3 mg/dL 7.4  7.3  7.7      Vitals:   10/19/22 0000 10/19/22 0400 10/19/22 0848 10/19/22 1208  BP: (!) 109/46 (!) 110/53 133/71 (!) 137/51  Pulse: 83 79 89 85  Resp: 17 16 16 18   Temp: 98 F (36.7 C) 97.6 F (36.4 C) 97.9 F (36.6 C) (!) 97.5 F (36.4 C)  TempSrc: Oral Oral Oral Oral  SpO2: 99% 97% 98% 97%  Weight:      Height:         Author: Loyce Dys, MD 10/19/2022 3:43 PM  For on call review [email or web address].

## 2022-10-20 ENCOUNTER — Other Ambulatory Visit: Payer: Self-pay | Admitting: Pharmacist

## 2022-10-20 DIAGNOSIS — A419 Sepsis, unspecified organism: Secondary | ICD-10-CM | POA: Diagnosis not present

## 2022-10-20 DIAGNOSIS — R6520 Severe sepsis without septic shock: Secondary | ICD-10-CM | POA: Diagnosis not present

## 2022-10-20 DIAGNOSIS — R531 Weakness: Secondary | ICD-10-CM | POA: Diagnosis not present

## 2022-10-20 DIAGNOSIS — A4154 Sepsis due to Acinetobacter baumannii: Secondary | ICD-10-CM | POA: Diagnosis not present

## 2022-10-20 DIAGNOSIS — L03113 Cellulitis of right upper limb: Secondary | ICD-10-CM | POA: Diagnosis not present

## 2022-10-20 LAB — BASIC METABOLIC PANEL
Anion gap: 5 (ref 5–15)
BUN: 29 mg/dL — ABNORMAL HIGH (ref 8–23)
CO2: 24 mmol/L (ref 22–32)
Calcium: 7.2 mg/dL — ABNORMAL LOW (ref 8.9–10.3)
Chloride: 106 mmol/L (ref 98–111)
Creatinine, Ser: 0.5 mg/dL — ABNORMAL LOW (ref 0.61–1.24)
GFR, Estimated: >60 mL/min (ref >60)
Glucose, Bld: 92 mg/dL (ref 70–99)
Potassium: 3.6 mmol/L (ref 3.5–5.1)
Sodium: 135 mmol/L (ref 135–145)

## 2022-10-20 LAB — CBC WITH DIFFERENTIAL/PLATELET
Abs Immature Granulocytes: 0.02 10*3/uL (ref 0.00–0.07)
Basophils Absolute: 0 10*3/uL (ref 0.0–0.1)
Basophils Relative: 1 %
Eosinophils Absolute: 0 10*3/uL (ref 0.0–0.5)
Eosinophils Relative: 1 %
HCT: 31.1 % — ABNORMAL LOW (ref 39.0–52.0)
Hemoglobin: 9.9 g/dL — ABNORMAL LOW (ref 13.0–17.0)
Immature Granulocytes: 1 %
Lymphocytes Relative: 34 %
Lymphs Abs: 0.6 10*3/uL — ABNORMAL LOW (ref 0.7–4.0)
MCH: 30.3 pg (ref 26.0–34.0)
MCHC: 31.8 g/dL (ref 30.0–36.0)
MCV: 95.1 fL (ref 80.0–100.0)
Monocytes Absolute: 0.4 10*3/uL (ref 0.1–1.0)
Monocytes Relative: 25 %
Neutro Abs: 0.7 10*3/uL — ABNORMAL LOW (ref 1.7–7.7)
Neutrophils Relative %: 38 %
Platelets: 70 10*3/uL — ABNORMAL LOW (ref 150–400)
RBC: 3.27 MIL/uL — ABNORMAL LOW (ref 4.22–5.81)
RDW: 17.1 % — ABNORMAL HIGH (ref 11.5–15.5)
Smear Review: NORMAL
WBC: 1.8 10*3/uL — ABNORMAL LOW (ref 4.0–10.5)
nRBC: 0 % (ref 0.0–0.2)

## 2022-10-20 LAB — CULTURE, BLOOD (ROUTINE X 2): Special Requests: ADEQUATE

## 2022-10-20 MED ORDER — DEXAMETHASONE 0.5 MG PO TABS
2.0000 mg | ORAL_TABLET | Freq: Every day | ORAL | Status: AC
  Administered 2022-10-21 – 2022-10-23 (×3): 2 mg via ORAL
  Filled 2022-10-20 (×3): qty 4

## 2022-10-20 MED ORDER — DEXAMETHASONE 0.5 MG PO TABS
1.0000 mg | ORAL_TABLET | Freq: Every day | ORAL | Status: DC
  Administered 2022-10-24: 1 mg via ORAL
  Filled 2022-10-20: qty 2

## 2022-10-20 MED ORDER — SODIUM CHLORIDE 0.9 % IV SOLN
3.0000 g | Freq: Four times a day (QID) | INTRAVENOUS | Status: DC
  Administered 2022-10-20 – 2022-10-24 (×16): 3 g via INTRAVENOUS
  Filled 2022-10-20 (×17): qty 8

## 2022-10-20 NOTE — Evaluation (Signed)
Physical Therapy Evaluation Patient Details Name: Dean [name]. MRN: [id number] DOB: 24-Aug-1935 Today's Date: 10/20/2022  History of Present Illness  87 y.o. male with medical history significant of MM on pomalidomide, BPH with recurrent UTIs, chronic HFpEF, PAF on Eliquis, mild/moderate pulmonary hypertension, anxiety/depression, brought in by family member for evaluation of generalized weakness, right-sided arm rash and swelling and pain.  Patient noted to have sepsis secondary to UTI as well as right arm cellulitis with diastolic heart failure exacerbation and therefore admitted for further management.  Clinical Impression   Pt is seen by OT and PT for a co-evaluation. Pt is received in bed, he is agreeable to session. At baseline, Pt uses RW for short distance amb and requires assistance from family to complete ADLs. Pt performs bed mobility mod-max and transfers min A x2 for safety. Pt able to perform 2x STS with improved technique following cuing for hand placement. Additionally, Pt able to perform x4 lat steps towards [facility] with min A x2 for increased stability. Noted LLE slightly flexed during standing activity with RLE in hyperextension but corrected following cues. Pt reports slight discomfort on L knee area at end of session. Pt would benefit from skilled PT to address above deficits and promote optimal return to PLOF.       If plan is discharge home, recommend the following: A lot of help with walking and/or transfers;A lot of help with bathing/dressing/bathroom;Assist for transportation;Help with stairs or ramp for entrance   Can travel by private vehicle   No    Equipment Recommendations None recommended by PT  Recommendations for Other Services       Functional Status Assessment Patient has had a recent decline in their functional status and demonstrates the ability to make significant improvements in function in a reasonable and predictable amount of time.     Precautions  / Restrictions Precautions Precautions: Fall Restrictions Weight Bearing Restrictions: No      Mobility  Bed Mobility Overal bed mobility: Needs Assistance             General bed mobility comments: required mod-max A to complete bed mobility    Transfers   Equipment used: Rolling walker (2 wheels)               General transfer comment: required min A x2 for STS x2    Ambulation/Gait               General Gait Details: Not assessed during visit due to increased fatigue  Stairs            Wheelchair Mobility     Tilt Bed    Modified Rankin (Stroke Patients Only)       Balance Overall balance assessment: Needs assistance Sitting-balance support: Feet supported, Single extremity supported Sitting balance-Leahy Scale: Good Sitting balance - Comments: Able to maintain seated EOB balance with no notable difficulties   Standing balance support: Reliant on assistive device for balance, Bilateral upper extremity supported, During functional activity Standing balance-Leahy Scale: Fair Standing balance comment: flex forward posture which corrected with cuing; heavy reliance on RW                             Pertinent Vitals/Pain Pain Assessment Pain Assessment: No/denies pain    Home Living Family/patient expects to be discharged to:: Private residence Living Arrangements: Children Available Help at Discharge: Family;Available PRN/intermittently Type of Home: House Home Access: Ramped entrance  Home Layout: One level Home Equipment: Agricultural consultant (2 wheels);BSC/3in1;Cane - single point;Wheelchair - manual Additional Comments: Pt recently moved in with daughter ~ 3 weeks ago.    Prior Function Prior Level of Function : Needs assist       Physical Assist : Mobility (physical);ADLs (physical) Mobility (physical): Bed mobility;Transfers;Gait;Stairs   Mobility Comments: short distance ambulation with RW ADLs Comments:  pt ind with ADLs/IADLs     Extremity/Trunk Assessment   Upper Extremity Assessment Upper Extremity Assessment: Generalized weakness    Lower Extremity Assessment Lower Extremity Assessment: Generalized weakness       Communication   Communication Communication: No apparent difficulties Cueing Techniques: Tactile cues;Verbal cues  Cognition                                       General Comments: pleasant and cooperative during session        General Comments      Exercises Other Exercises Other Exercises: lat side stepping with RW towards HOB, min A x2   Assessment/Plan    PT Assessment Patient needs continued PT services  PT Problem List Decreased strength;Decreased mobility;Decreased activity tolerance;Decreased balance;Decreased coordination;Decreased knowledge of use of DME       PT Treatment Interventions DME instruction;Gait training;Functional mobility training;Therapeutic activities;Therapeutic exercise;Balance training    PT Goals (Current goals can be found in the Care Plan section)  Acute Rehab PT Goals Patient Stated Goal: to get better PT Goal Formulation: With patient Time For Goal Achievement: 11/03/22 Potential to Achieve Goals: Good    Frequency Min 1X/week     Co-evaluation   Reason for Co-Treatment: To address functional/ADL transfers PT goals addressed during session: Mobility/safety with mobility OT goals addressed during session: ADL's and self-care       AM-PAC PT "6 Clicks" Mobility  Outcome Measure Help needed turning from your back to your side while in a flat bed without using bedrails?: A Lot Help needed moving from lying on your back to sitting on the side of a flat bed without using bedrails?: A Lot Help needed moving to and from a bed to a chair (including a wheelchair)?: A Lot Help needed standing up from a chair using your arms (e.g., wheelchair or bedside chair)?: A Lot Help needed to walk in hospital  room?: A Lot Help needed climbing 3-5 steps with a railing? : Total 6 Click Score: 11    End of Session Equipment Utilized During Treatment: Gait belt Activity Tolerance: Patient limited by fatigue Patient left: in bed;with call bell/phone within reach;with bed alarm set;Other (comment) (BLE elevated) Nurse Communication: Mobility status PT Visit Diagnosis: Repeated falls (R29.6);Muscle weakness (generalized) (M62.81);Unsteadiness on feet (R26.81);Pain Pain - Right/Left: Left Pain - part of body: Knee    Time: 7846-9629 PT Time Calculation (min) (ACUTE ONLY): 16 min   Charges:                 Elmon Else, SPT   Shaquon Gropp 10/20/2022, 3:31 PM

## 2022-10-20 NOTE — [place] FL2 (Signed)
[place] MEDICAID FL2 LEVEL OF CARE FORM     IDENTIFICATION  Patient Name: Dean [name]. Birthdate: August 18, 1935 Sex: male Admission Date (Current Location): 10/17/2022  [facility] and [place] Number:  Chiropodist and Address:  [facility], [address] [place], [place] [address]      Provider Number: [id number]  Attending Physician Name and Address:  Loyce Dys, MD  Relative Name and Phone Number:  [name] (daughter) [phone]    Current Level of Care:   Recommended Level of Care: Skilled Nursing Facility Prior Approval Number:    Date Approved/Denied:   PASRR Number: [id number]  Discharge Plan: SNF    Current Diagnoses: Patient Active Problem List   Diagnosis Date Noted   Sepsis due to cellulitis (HCC) 10/17/2022   Leukopenia 10/17/2022   Paroxysmal A-fib (HCC) 10/17/2022   Sepsis (HCC) 10/17/2022   Acute hypoxic respiratory failure (HCC) 08/19/2022   PNA (pneumonia) 08/19/2022   Urethral bleeding 08/19/2022   Uncomplicated opioid dependence (HCC) 02/13/2022   SVT (supraventricular tachycardia) 02/12/2022   Pulmonary embolism (HCC) 02/10/2022   Acute metabolic encephalopathy 02/10/2022   Pain in left arm 10/16/2021   Closed fracture of left clavicle 08/19/2021   Spinal stenosis, lumbar 10/13/2016   Chronic back pain 10/01/2016   DDD (degenerative disc disease), lumbar 10/01/2016   OA (osteoarthritis) of knee 10/01/2016   Diverticulitis s/p robotic sigmoid colectomy 07/11/2015 07/11/2015   Goals of care, counseling/discussion    Abnormal CT scan, sigmoid colon 04/02/2015   Abnormal PET scan of colon 04/02/2015   Thrombocytopenia (HCC) 01/09/2015   Multiple myeloma without remission (HCC) 02/15/2014   Allergic rhinitis 01/16/2014   History of vertebral stress fracture 01/16/2014   Dermatitis 01/16/2014   Loss of weight 09/05/2013   Insomnia 09/05/2013   Fatigue 05/16/2013   Neck pain 12/07/2012   Skull lesion  11/29/2012   Neuropathy (HCC)    BPH (benign prostatic hyperplasia)    GERD (gastroesophageal reflux disease)     Orientation RESPIRATION BLADDER Height & Weight     Self, Time, Situation, Place  Normal Incontinent, External catheter Weight: 197 lb 15.6 oz (89.8 kg) Height:  5\' 8"  (172.7 cm)  BEHAVIORAL SYMPTOMS/MOOD NEUROLOGICAL BOWEL NUTRITION STATUS      Continent Diet (see discharge summary)  AMBULATORY STATUS COMMUNICATION OF NEEDS Skin   Extensive Assist Verbally Normal                       Personal Care Assistance Level of Assistance  Bathing, Feeding, Dressing, Total care Bathing Assistance: Maximum assistance Feeding assistance: Limited assistance Dressing Assistance: Maximum assistance Total Care Assistance: Maximum assistance   Functional Limitations Info  Sight, Hearing, Speech Sight Info: Impaired Hearing Info: Impaired Speech Info: Adequate    SPECIAL CARE FACTORS FREQUENCY  PT (By licensed PT), OT (By licensed OT)     PT Frequency: min 4x weekly OT Frequency: min 4x weekly            Contractures Contractures Info: Not present    Additional Factors Info  Code Status, Allergies Code Status Info: DNR Allergies Info: codeine, morphine and codeine, revlimid (lenalidomide)           Current Medications (10/20/2022):  This is the current hospital active medication list Current Facility-Administered Medications  Medication Dose Route Frequency Provider Last Rate Last Admin   acetaminophen (TYLENOL) tablet 1,000 mg  1,000 mg Oral Daily PRN Emeline General, MD   1,000  mg at 10/20/22 0950   ALPRAZolam (XANAX) tablet 0.5 mg  0.5 mg Oral TID PRN Emeline General, MD       Ampicillin-Sulbactam (UNASYN) 3 g in sodium chloride 0.9 % 100 mL IVPB  3 g Intravenous Q6H Loyce Dys, MD       dexamethasone (DECADRON) tablet 4 mg  4 mg Oral Georgia Duff T, MD   4 mg at 10/20/22 0805   furosemide (LASIX) injection 20 mg  20 mg Intravenous BID Rosezetta Schlatter T,  MD   20 mg at 10/20/22 0806   gabapentin (NEURONTIN) capsule 400 mg  400 mg Oral BID Mikey College T, MD   400 mg at 10/20/22 0805   guaiFENesin-dextromethorphan (ROBITUSSIN DM) 100-10 MG/5ML syrup 5 mL  5 mL Oral Q4H PRN Mikey College T, MD       influenza vaccine adjuvanted (FLUAD) injection 0.5 mL  0.5 mL Intramuscular Tomorrow-1000 Rosezetta Schlatter T, MD       metoprolol tartrate (LOPRESSOR) injection 5 mg  5 mg Intravenous Q4H PRN Mikey College T, MD       metoprolol tartrate (LOPRESSOR) tablet 12.5 mg  12.5 mg Oral BID Mikey College T, MD   12.5 mg at 10/20/22 0805   montelukast (SINGULAIR) tablet 10 mg  10 mg Oral QHS Mikey College T, MD   10 mg at 10/19/22 2111   oxyCODONE (Oxy IR/ROXICODONE) immediate release tablet 10 mg  10 mg Oral TID PRN Mikey College T, MD   10 mg at 10/19/22 1644   oxyCODONE (Oxy IR/ROXICODONE) immediate release tablet 5 mg  5 mg Oral TID PRN Mikey College T, MD       tamsulosin [facility]) capsule 0.4 mg  0.4 mg Oral QPC supper Mikey College T, MD   0.4 mg at 10/19/22 1626   traZODone (DESYREL) tablet 300 mg  300 mg Oral QHS Mikey College T, MD   300 mg at 10/19/22 2111   Facility-Administered Medications Ordered in Other Encounters  Medication Dose Route Frequency Provider Last Rate Last Admin   heparin lock flush 100 unit/mL  500 Units Intravenous Once Jeralyn Ruths, MD         Discharge Medications: Please see discharge summary for a list of discharge medications.  Relevant Imaging Results:  Relevant Lab Results:   Additional Information SSN: [id number]  Darolyn Rua, LCSW

## 2022-10-20 NOTE — TOC Initial Note (Signed)
Transition of Care (TOC) - Initial/Assessment Note    Patient Details  Name: Dean [name]. MRN: [id number] Date of Birth: 1936/01/25  Transition of Care [facility]) CM/SW Contact:    Darolyn Rua, LCSW Phone Number: 10/20/2022, 11:07 AM  Clinical Narrative:                  CSW met with patient and daughter Dean [name] at bedside, they confirm PCP is Lynnea Ferrier, [place].   Patient has wheel chair, cane and walker at home. Pharmacy is [facility], they report being hopeful of snf at discharge for rehab with preference of [facility]. Patient has not been to a snf before but realizes he needs rehab.   Referral sent pending bed offer.    Barriers to Discharge: Continued Medical Work up   Patient Goals and CMS Choice Patient states their goals for this hospitalization and ongoing recovery are:: to go home          Expected Discharge Plan and Services       Living arrangements for the past 2 months: Single Family Home                                      Prior Living Arrangements/Services Living arrangements for the past 2 months: Single Family Home Lives with:: Self                   Activities of Daily Living Home Assistive Devices/Equipment: Grab bars around toilet, Hearing aid ADL Screening (condition at time of admission) Patient's cognitive ability adequate to safely complete daily activities?: Yes Is the patient deaf or have difficulty hearing?: Yes Does the patient have difficulty seeing, even when wearing glasses/contacts?: Yes Does the patient have difficulty concentrating, remembering, or making decisions?: No Patient able to express need for assistance with ADLs?: Yes Does the patient have difficulty dressing or bathing?: Yes Independently performs ADLs?: No Communication: Needs assistance Dressing (OT): Needs assistance Grooming: Appropriate for developmental age Feeding: Independent Bathing: Needs assistance Toileting:  Dependent In/Out Bed: Needs assistance Walks in Home: Needs assistance Does the patient have difficulty walking or climbing stairs?: Yes Weakness of Legs: Both Weakness of Arms/Hands: None  Permission Sought/Granted                  Emotional Assessment              Admission diagnosis:  Weakness [R53.1] Cellulitis of right arm [L03.113] Sepsis (HCC) [A41.9] Sepsis, due to unspecified organism, unspecified whether acute organ dysfunction present [facility]) [A41.9] Patient Active Problem List   Diagnosis Date Noted   Sepsis due to cellulitis (HCC) 10/17/2022   Leukopenia 10/17/2022   Paroxysmal A-fib (HCC) 10/17/2022   Sepsis (HCC) 10/17/2022   Acute hypoxic respiratory failure (HCC) 08/19/2022   PNA (pneumonia) 08/19/2022   Urethral bleeding 08/19/2022   Uncomplicated opioid dependence (HCC) 02/13/2022   SVT (supraventricular tachycardia) 02/12/2022   Pulmonary embolism (HCC) 02/10/2022   Acute metabolic encephalopathy 02/10/2022   Pain in left arm 10/16/2021   Closed fracture of left clavicle 08/19/2021   Spinal stenosis, lumbar 10/13/2016   Chronic back pain 10/01/2016   DDD (degenerative disc disease), lumbar 10/01/2016   OA (osteoarthritis) of knee 10/01/2016   Diverticulitis s/p robotic sigmoid colectomy 07/11/2015 07/11/2015   Goals of care, counseling/discussion    Abnormal CT scan, sigmoid colon 04/02/2015   Abnormal PET scan of  colon 04/02/2015   Thrombocytopenia (HCC) 01/09/2015   Multiple myeloma without remission (HCC) 02/15/2014   Allergic rhinitis 01/16/2014   History of vertebral stress fracture 01/16/2014   Dermatitis 01/16/2014   Loss of weight 09/05/2013   Insomnia 09/05/2013   Fatigue 05/16/2013   Neck pain 12/07/2012   Skull lesion 11/29/2012   Neuropathy (HCC)    BPH (benign prostatic hyperplasia)    GERD (gastroesophageal reflux disease)    PCP:  Donita Brooks, MD Pharmacy:   [facility] [place], [place] - [address] [place] [address] Phone: [phone] Fax: [phone]  [facility] [place] - [address] [place] [address] Phone: [phone] Fax: [phone]     Social Determinants of Health (SDOH) Social History: SDOH Screenings   Food Insecurity: No Food Insecurity (10/18/2022)  Housing: Low Risk  (10/18/2022)  Transportation Needs: No Transportation Needs (10/18/2022)  Utilities: Not At Risk (10/18/2022)  Alcohol Screen: Low Risk  (01/09/2022)  Depression (PHQ2-9): Low Risk  (09/16/2022)  Financial Resource Strain: Low Risk  (03/21/2022)  Physical Activity: Inactive (01/09/2022)  Social Connections: Moderately Isolated (01/09/2022)  Stress: Stress Concern Present (01/09/2022)  Tobacco Use: Low Risk  (10/18/2022)   SDOH Interventions:     Readmission Risk Interventions    08/20/2022    1:21 PM  Readmission Risk Prevention Plan  Transportation Screening Complete  PCP or Specialist Appt within 3-5 Days Complete  HRI or Home Care Consult Complete  Social Work Consult for Recovery Care Planning/Counseling Complete  Palliative Care Screening Not Applicable  Medication Review [employer]) Complete

## 2022-10-20 NOTE — Progress Notes (Signed)
Progress Note   Patient: Dean [name]. NWG:[id number] DOB: February 08, 1935 DOA: 10/17/2022     3 DOS: the patient was seen and examined on 10/20/2022     Subjective:  Patient seen and examined in the presence of the daughter He admits to improvement in his general condition as well as right arm cellulitis Denies nausea vomiting abdominal pain chest pain    Brief hospital course: From HPI "Dean [name]. is a 87 y.o. male with medical history significant of MM on pomalidomide, BPH with recurrent UTIs, chronic HFpEF, PAF on Eliquis, mild/moderate pulmonary hypertension, anxiety/depression, brought in by family member for evaluation of generalized weakness, right-sided arm rash and swelling and pain.  Patient noted to have sepsis secondary to UTI as well as right arm cellulitis with diastolic heart failure exacerbation and therefore admitted for further management.   Assessment and Plan: Sepsis secondary to right arm cellulitis as well as urinary tract infection Acinetobacter bacteremia -Evidenced by leukopenia, with WBC 1.8 heart rate as high as 110 tachycardia elevated lactate source of infection is likely combined UTI and right arm cellulitis Blood culture results taken on 10/17/2022 growing Acinetobacter as well as baumannii.  Repeat cultures taken on 10/18/2022 shows no growth at this time I have consulted infectious disease who has recommended removal of right-sided port I have also discussed the case with infectious disease as well as oncologist who agrees for removal of the port. Antibiotics have been broadened We appreciate the input of infectious disease   Complicated UTI Continue current antibiotics   Right arm cellulitis Continue management as above     Potassium-continue potassium replacement and monitoring   Chronic persistent atrial fibrillation with rapid ventricular response -Likely secondary to sepsis -Given the patient also has signs of active CHF decompensation,  decided to give 1 dose of digoxin. -Continue metoprolol 25 g p.o. twice daily -Add as needed Lopressor for breakthrough heart rate -CCB contraindicated as patient's blood pressure is low. Continue IV Lasix Continue to monitor daily weight Continue to monitor input and output I discussed the risks and benefits of resuming Eliquis with patient as well as patient's daughter and her daughter has opted for Eliquis to be withheld at this time given the low platelet levels    Acute on chronic HFpEF decompensation -Likely secondary to poorly controlled A-fib, management as above We have initiated Lasix today as blood pressure allows Continue current IV Lasix   History of urethral rupture -Family reported that the patient urethra is fragile for straight cath and/or Foley, as he has a history of ureteral rupture on last admission when insertion of foley for UA collection resulted in the third rapture requiring prolonged hospital stay for urology repair.  Discussed with nurse of sending urine with a random sample but no straight cath.   Pancytopenia likely secondary to multiple myeloma -Acute phase is likely to related to sepsis, chronically patient has MM on chronic chemotherapy Outpatient follow-up with oncologist Monitor CBC and transfuse as needed   Multiple myeloma -Continue pomalidomide -Outpatient follow-up with oncology -Continue dexamethasone-I have reached out to Dr. Gerarda Fraction and he confirmed that they placed him on tomorrow for appetite and so we can taper this off.   Anxiety/depression -Continue SSRI   DVT prophylaxis: SCD at this time given thrombocytopenia, holding Eliquis   Code Status: DNR   Family Communication: Wife at bedside   Disposition Plan: Patient is sick with sepsis and CHF decompensation requiring IV antibiotics and further CHF management after resolution  of sepsis, expect more than 2 midnight hospital   Consults called: None       Physical  Exam: Eyes: PERRL, lids and conjunctivae normal ENMT: Mucous membranes are moist.  Neck: normal, supple, no masses, no thyromegaly Respiratory: Decreased air entry especially at the bases Cardiovascular: Irregularly irregular Abdomen: no tenderness, no masses palpated.  Musculoskeletal: no clubbing / cyanosis.  Skin: no rashes, lesions, ulcers. No induration Neurologic: CN 2-12 grossly intact. Sensation intact Psychiatric: Normal judgment and insight.    Data Reviewed: I have reviewed patient's lab results including CBC, BMP I have also reviewed patient's vitals I have discussed the case with transition of care manager and reviewed documentation.       Latest Ref Rng & Units 10/20/2022    4:18 AM 10/19/2022    3:20 AM 10/18/2022    2:54 AM  CBC  WBC 4.0 - 10.5 K/uL 1.8  2.0  1.8   Hemoglobin 13.0 - 17.0 g/dL 9.9  9.9  9.8   Hematocrit 39.0 - 52.0 % 31.1  31.0  30.8   Platelets 150 - 400 K/uL 70  69  74        Latest Ref Rng & Units 10/20/2022    4:18 AM 10/19/2022    3:20 AM 10/18/2022    2:54 AM  BMP  Glucose 70 - 99 mg/dL 92  161  96   BUN 8 - 23 mg/dL 29  22  27    Creatinine 0.61 - 1.24 mg/dL 0.96  0.45  4.09   Sodium 135 - 145 mmol/L 135  135  134   Potassium 3.5 - 5.1 mmol/L 3.6  4.1  3.4   Chloride 98 - 111 mmol/L 106  107  105   CO2 22 - 32 mmol/L 24  23  24    Calcium 8.9 - 10.3 mg/dL 7.2  7.4  7.3      Vitals:   10/20/22 0431 10/20/22 0807 10/20/22 1141 10/20/22 1542  BP: 129/66 (!) 150/54 (!) 105/50 (!) 114/52  Pulse: 76 87 92 74  Resp: 13 20 18 20   Temp: 97.9 F (36.6 C) (!) 97.5 F (36.4 C) 97.7 F (36.5 C) 97.7 F (36.5 C)  TempSrc: Oral Oral Oral Oral  SpO2: 94% 97% 96% 95%  Weight:      Height:         Author: Loyce Dys, MD 10/20/2022 4:12 PM  For on call review [email or web address].

## 2022-10-20 NOTE — Consult Note (Signed)
Date of Admission:  10/17/2022          Reason for Consult: Acinetobacter bacterermia    Referring Provider: Rosezetta Schlatter, M D   Assessment:  Acinetobacter bacteremia presumably from Right upper arm cellulitis at site of skin cut and bruise Multiple myeloma on pomalidomide  Paroxysmal atrial fibrillation  Plan:  Narrow to Unasyn I have put in orders for port removal after conferring with Dr. Orlie Dakin Would repeat blood culturesafter port is been removed.  Dr. Rivka Safer will be back tomorrow.  Principal Problem:   Sepsis (HCC) Active Problems:   Multiple myeloma without remission (HCC)   Sepsis due to cellulitis (HCC)   Leukopenia   Paroxysmal A-fib (HCC)   Scheduled Meds:  dexamethasone  4 mg Oral QODAY   furosemide  20 mg Intravenous BID   gabapentin  400 mg Oral BID   influenza vaccine adjuvanted  0.5 mL Intramuscular Tomorrow-1000   metoprolol tartrate  12.5 mg Oral BID   montelukast  10 mg Oral QHS   tamsulosin  0.4 mg Oral QPC supper   traZODone  300 mg Oral QHS   Continuous Infusions:  ampicillin-sulbactam (UNASYN) IV     PRN Meds:.acetaminophen, ALPRAZolam, guaiFENesin-dextromethorphan, metoprolol tartrate, oxyCODONE, oxyCODONE  HPI: Carlin Eze. is a 87 y.o. male with past medical history significant for multiple myeloma on pomalidomide ,  BPH, recurrent urinary tract infections chronic heart failure paroxysmal atrial fibrillation pulmonary hypertension was brought by family to the ER due to worsening weakness malaise and inability to stand along with some increased confusion.  He had developed erythema around an area where he had bruised himself and cut himself on his right arm and this extended rapidly in the ensuing 24 hours.  He was brought to the ER where he was febrile and tachycardic.  Imaging was done blood cultures were taken though the set of 2 that were taken on admission were separated out by almost 12 hours and he received  rocephin in the interim  He has moved with ceftriaxone and fluids.  Blood cultures have subsequently come back positive for Acinetobacter on the first set that were drawn.  He thought that he had a urinary tract infection because of his urine having been darker in began to smell an odd smell although there is no report of dysuria suprapubic pain or new onset flank pain and so clinically this is NOT c/w UTI.  His urine cultures also were unrevealing.   Based on his clinical history would presume that he developed this bacteremia (if it is a genuine bacteremia--though I dont think we can argue out of this given presentation and given pt got ceftriaxone in between + and negative  Preiminary cultures) from his right arm colitis which is dramatically improved.  Certainly not a typical organism to manifest from a cellulitis though he is immunocompromised.  The greater concern now is that the port could have been seeded.  I have talked with the patient's oncologist Dr. Orlie Dakin via secure chat and he is in agreement with port removal.  I placed orders for port to be removed and will order repeat blood cultures tomorrow post port extraction.  We have narrowed him to Unasyn for now.    I have personally spent 82 minutes involved in face-to-face and non-face-to-face activities for this patient on the day of the visit. Professional time spent includes the following activities: Preparing to see the patient (review of tests), Obtaining and/or reviewing separately  obtained history (admission/discharge record), Performing a medically appropriate examination and/or evaluation , Ordering medications/tests/procedures, referring and communicating with other health care professionals, Documenting clinical information in the EMR, Independently interpreting results (not separately reported), Communicating results to the patient/family/caregiver, Counseling and educating the patient/family/caregiver and Care  coordination (not separately reported).   Review of Systems: Review of Systems  Constitutional:  Positive for malaise/fatigue. Negative for chills, fever and weight loss.  HENT:  Negative for congestion and sore throat.   Eyes:  Negative for blurred vision and photophobia.  Respiratory:  Negative for cough, shortness of breath and wheezing.   Cardiovascular:  Positive for leg swelling. Negative for chest pain and palpitations.  Gastrointestinal:  Negative for abdominal pain, blood in stool, constipation, diarrhea, heartburn, melena, nausea and vomiting.  Genitourinary:  Negative for dysuria, flank pain and hematuria.  Musculoskeletal:  Negative for back pain, falls, joint pain and myalgias.  Skin:  Negative for itching and rash.  Neurological:  Positive for dizziness, focal weakness and weakness. Negative for loss of consciousness and headaches.  Endo/Heme/Allergies:  Bruises/bleeds easily.  Psychiatric/Behavioral:  Negative for depression and suicidal ideas. The patient does not have insomnia.     Past Medical History:  Diagnosis Date   BPH (benign prostatic hyperplasia)    Colonic diverticular abscess 01/08/2015   Diverticulitis 12/22/2014   complicated by abscess and required percutaneous drainage   GERD (gastroesophageal reflux disease)    H/O ETOH abuse    History of chemotherapy last done jan 2017   History of radiation therapy 01/05/13-02/10/13   45 gray to left occipital condyle region   History of radiation therapy 12/01/16-12/10/16   Parasternal nodule, chest- 24 Gy total delivered in 8 fractions, Left sacro-iliac, pelvis- 24 Gy total delivered in 8 fractions    History of radiation therapy 02/19/17-02/22/17   right temporal scalp 30 Gy in 10 fractions   History of radiation therapy    Right hip( Pelvis) 11/18/21-11/21/21-Dr. Antony Blackbird   Intra-abdominal abscess [facility])    Multiple myeloma (HCC) 2015   Neuropathy    Radiation 02/21/14-03/08/14   right posterior chest wall  area 30 gray   Radiation 04/19/14-05/02/14   lumbar spine 25 gray   Skull lesion    Left occipital condyle   Wrist fracture, left    x 2    Social History   Tobacco Use   Smoking status: Never   Smokeless tobacco: Never  Vaping Use   Vaping status: Never Used  Substance Use Topics   Alcohol use: No    Comment: " Not much no more"   Drug use: No    Family History  Problem Relation Age of Onset   Diabetes Mother    Stroke Mother    Hypertension Father    Allergies  Allergen Reactions   Codeine Other (See Comments)    Headache   Morphine And Codeine Other (See Comments)    Makes him feel weird   Revlimid [Lenalidomide] Other (See Comments)    "Causes me to become weak"    OBJECTIVE: Blood pressure (!) 105/50, pulse 92, temperature 97.7 F (36.5 C), temperature source Oral, resp. rate 18, height 5\' 8"  (1.727 m), weight 89.8 kg, SpO2 96%.  Physical Exam Constitutional:      Appearance: He is well-developed.  HENT:     Head: Normocephalic and atraumatic.  Eyes:     Conjunctiva/sclera: Conjunctivae normal.  Cardiovascular:     Rate and Rhythm: Normal rate and regular rhythm.  Heart sounds: Murmur heard.     No friction rub. No gallop.  Pulmonary:     Effort: Pulmonary effort is normal. No respiratory distress.     Breath sounds: No stridor. No wheezing.  Chest:     Chest wall: No tenderness.  Abdominal:     General: There is no distension.     Palpations: Abdomen is soft.  Musculoskeletal:        General: Normal range of motion.     Cervical back: Normal range of motion and neck supple.  Skin:    General: Skin is warm and dry.     Coloration: Skin is pale.     Findings: Erythema present. No rash.  Neurological:     General: No focal deficit present.     Mental Status: He is alert and oriented to person, place, and time.  Psychiatric:        Mood and Affect: Mood normal.        Behavior: Behavior normal.        Thought Content: Thought content  normal.        Judgment: Judgment normal.    Right arm 10/17/2022:     Right arm 10/20/2022:    Lab Results Lab Results  Component Value Date   WBC 1.8 (L) 10/20/2022   HGB 9.9 (L) 10/20/2022   HCT 31.1 (L) 10/20/2022   MCV 95.1 10/20/2022   PLT 70 (L) 10/20/2022    Lab Results  Component Value Date   CREATININE 0.50 (L) 10/20/2022   BUN 29 (H) 10/20/2022   NA 135 10/20/2022   K 3.6 10/20/2022   CL 106 10/20/2022   CO2 24 10/20/2022    Lab Results  Component Value Date   ALT 12 10/17/2022   AST 16 10/17/2022   ALKPHOS 17 (L) 10/17/2022   BILITOT 0.7 10/17/2022     Microbiology: Recent Results (from the past 240 hour(s))  Blood Culture (routine x 2)     Status: Abnormal   Collection Time: 10/17/22 10:50 AM   Specimen: BLOOD  Result Value Ref Range Status   Specimen Description   Final    BLOOD BLOOD LEFT ARM Performed at [facility], [address], [place], [place] [address]    Special Requests   Final    BOTTLES DRAWN AEROBIC AND ANAEROBIC Blood Culture adequate volume Performed at [facility], [address] [place], [place] [address]    Culture  Setup Time   Final    Organism ID to follow IN BOTH AEROBIC AND ANAEROBIC BOTTLES GRAM NEGATIVE COCCI CRITICAL RESULT CALLED TO, READ BACK BY AND VERIFIED WITH: Torell ROBBINS @ 0008 10/18/22 LFD Performed at [facility], [address] [place], [place] [address]    Culture ACINETOBACTER CALCOACETICUS/BAUMANNII COMPLEX (A)  Final   Report Status 10/20/2022 FINAL  Final   Organism ID, Bacteria ACINETOBACTER CALCOACETICUS/BAUMANNII COMPLEX  Final      Susceptibility   Acinetobacter calcoaceticus/baumannii complex - MIC*    CEFTAZIDIME 8 SENSITIVE Sensitive     CIPROFLOXACIN <=0.25 SENSITIVE Sensitive     GENTAMICIN <=1 SENSITIVE Sensitive     IMIPENEM <=0.25 SENSITIVE Sensitive     PIP/TAZO <=4 SENSITIVE Sensitive     TRIMETH/SULFA <=20 SENSITIVE Sensitive      AMPICILLIN/SULBACTAM <=2 SENSITIVE Sensitive     * ACINETOBACTER CALCOACETICUS/BAUMANNII COMPLEX  Blood Culture ID Panel (Reflexed)     Status: None   Collection Time: 10/17/22 10:50 AM  Result Value Ref  Range Status   Enterococcus faecalis NOT DETECTED NOT DETECTED Final   Enterococcus Faecium NOT DETECTED NOT DETECTED Final   Listeria monocytogenes NOT DETECTED NOT DETECTED Final   Staphylococcus species NOT DETECTED NOT DETECTED Final   Staphylococcus aureus (BCID) NOT DETECTED NOT DETECTED Final   Staphylococcus epidermidis NOT DETECTED NOT DETECTED Final   Staphylococcus lugdunensis NOT DETECTED NOT DETECTED Final   Streptococcus species NOT DETECTED NOT DETECTED Final   Streptococcus agalactiae NOT DETECTED NOT DETECTED Final   Streptococcus pneumoniae NOT DETECTED NOT DETECTED Final   Streptococcus pyogenes NOT DETECTED NOT DETECTED Final   A.calcoaceticus-baumannii NOT DETECTED NOT DETECTED Final   Bacteroides fragilis NOT DETECTED NOT DETECTED Final   Enterobacterales NOT DETECTED NOT DETECTED Final   Enterobacter cloacae complex NOT DETECTED NOT DETECTED Final   Escherichia coli NOT DETECTED NOT DETECTED Final   Klebsiella aerogenes NOT DETECTED NOT DETECTED Final   Klebsiella oxytoca NOT DETECTED NOT DETECTED Final   Klebsiella pneumoniae NOT DETECTED NOT DETECTED Final   Proteus species NOT DETECTED NOT DETECTED Final   Salmonella species NOT DETECTED NOT DETECTED Final   Serratia marcescens NOT DETECTED NOT DETECTED Final   Haemophilus influenzae NOT DETECTED NOT DETECTED Final   Neisseria meningitidis NOT DETECTED NOT DETECTED Final   Pseudomonas aeruginosa NOT DETECTED NOT DETECTED Final   Stenotrophomonas maltophilia NOT DETECTED NOT DETECTED Final   Candida albicans NOT DETECTED NOT DETECTED Final   Candida auris NOT DETECTED NOT DETECTED Final   Candida glabrata NOT DETECTED NOT DETECTED Final   Candida krusei NOT DETECTED NOT DETECTED Final   Candida parapsilosis  NOT DETECTED NOT DETECTED Final   Candida tropicalis NOT DETECTED NOT DETECTED Final   Cryptococcus neoformans/gattii NOT DETECTED NOT DETECTED Final    Comment: Performed at [facility], [address] [place], [place] [address]  Resp Panel by RT-PCR (Flu A&B, Covid) Anterior Nasal Swab     Status: None   Collection Time: 10/17/22 12:05 PM   Specimen: Anterior Nasal Swab  Result Value Ref Range Status   SARS Coronavirus 2 by RT PCR NEGATIVE NEGATIVE Final    Comment: (NOTE) SARS-CoV-2 target nucleic acids are NOT DETECTED.  The SARS-CoV-2 RNA is generally detectable in upper respiratory specimens during the acute phase of infection. The lowest concentration of SARS-CoV-2 viral copies this assay can detect is 138 copies/mL. A negative result does not preclude SARS-Cov-2 infection and should not be used as the sole basis for treatment or other patient management decisions. A negative result may occur with  improper specimen collection/handling, submission of specimen other than nasopharyngeal swab, presence of viral mutation(s) within the areas targeted by this assay, and inadequate number of viral copies(<138 copies/mL). A negative result must be combined with clinical observations, patient history, and epidemiological information. The expected result is Negative.  Fact Sheet for Patients:  BloggerCourse.com  Fact Sheet for Healthcare Providers:  SeriousBroker.it  This test is no t yet approved or cleared by the [place] FDA and  has been authorized for detection and/or diagnosis of SARS-CoV-2 by FDA under an Emergency Use Authorization (EUA). This EUA will remain  in effect (meaning this test can be used) for the duration of the COVID-19 declaration under Section 564(b)(1) of the Act, 21 U.S.C.section 360bbb-3(b)(1), unless the authorization is terminated  or revoked sooner.       Influenza A by PCR NEGATIVE  NEGATIVE Final   Influenza B by PCR NEGATIVE NEGATIVE Final    Comment: (NOTE) The  Xpert Xpress SARS-CoV-2/FLU/RSV plus assay is intended as an aid in the diagnosis of influenza from Nasopharyngeal swab specimens and should not be used as a sole basis for treatment. Nasal washings and aspirates are unacceptable for Xpert Xpress SARS-CoV-2/FLU/RSV testing.  Fact Sheet for Patients: BloggerCourse.com  Fact Sheet for Healthcare Providers: SeriousBroker.it  This test is not yet approved or cleared by the [place] FDA and has been authorized for detection and/or diagnosis of SARS-CoV-2 by FDA under an Emergency Use Authorization (EUA). This EUA will remain in effect (meaning this test can be used) for the duration of the COVID-19 declaration under Section 564(b)(1) of the Act, 21 U.S.C. section 360bbb-3(b)(1), unless the authorization is terminated or revoked.  Performed at [facility], [address] [place], [place] [address]   MRSA Next Gen by PCR, Nasal     Status: None   Collection Time: 10/17/22  6:11 PM   Specimen: Nasal Mucosa; Nasal Swab  Result Value Ref Range Status   MRSA by PCR Next Gen NOT DETECTED NOT DETECTED Final    Comment: (NOTE) The GeneXpert MRSA Assay (FDA approved for NASAL specimens only), is one component of a comprehensive MRSA colonization surveillance program. It is not intended to diagnose MRSA infection nor to guide or monitor treatment for MRSA infections. Test performance is not FDA approved in patients less than 85 years old. Performed at [facility], [address] [place], [place] [address]   Blood Culture (routine x 2)     Status: None (Preliminary result)   Collection Time: 10/18/22  2:54 AM   Specimen: BLOOD  Result Value Ref Range Status   Specimen Description BLOOD BLOOD RIGHT HAND  Final   Special Requests   Final    BOTTLES DRAWN AEROBIC AND ANAEROBIC Blood  Culture adequate volume   Culture   Final    NO GROWTH 2 DAYS Performed at [facility], [address], [place], [place] [address]    Report Status PENDING  Incomplete  Urine Culture     Status: Abnormal   Collection Time: 10/18/22  9:56 AM   Specimen: Urine, Random  Result Value Ref Range Status   Specimen Description   Final    URINE, RANDOM Performed at [facility], [address], [place], [place] [address]    Special Requests   Final    NONE Reflexed from [id number] Performed at [facility], [address] [place], [place] [address]    Culture MULTIPLE SPECIES PRESENT, SUGGEST RECOLLECTION (A)  Final   Report Status 10/19/2022 FINAL  Final    Acey Lav, MD [facility] [phone] pager  10/20/2022, 12:48 PM

## 2022-10-20 NOTE — Care Management Important Message (Signed)
Important Message  Patient Details  Name: Dean [name]. MRN: [id number] Date of Birth: 05-25-1935   Medicare Important Message Given:  Yes     Johnell Comings 10/20/2022, 11:14 AM

## 2022-10-20 NOTE — Evaluation (Signed)
Occupational Therapy Evaluation Patient Details Name: Dean [name]. MRN: [id number] DOB: 1935/04/07 Today's Date: 10/20/2022   History of Present Illness 87 y.o. male with medical history significant of MM on pomalidomide, BPH with recurrent UTIs, chronic HFpEF, PAF on Eliquis, mild/moderate pulmonary hypertension, anxiety/depression, brought in by family member for evaluation of generalized weakness, right-sided arm rash and swelling and pain.  Patient noted to have sepsis secondary to UTI as well as right arm cellulitis with diastolic heart failure exacerbation and therefore admitted for further management.   Clinical Impression   Patient presenting with decreased Ind in self care,balance, functional mobility/transfers, endurance, and safety awareness. Patient reports living at home with daughter and additional family. He moved in with family in the last 3 weeks. Pt ambulates short distances with AD but has been needing some assistance with self care and IADLs. Mod- max A of 1 for bed mobility and min A of 2 for standing to take 3-4 side steps towards the R.   Patient will benefit from acute OT to increase overall independence in the areas of ADLs, functional mobility, and safety awareness in order to safely discharge.    If plan is discharge home, recommend the following: A lot of help with walking and/or transfers;A lot of help with bathing/dressing/bathroom;Assistance with cooking/housework;Assist for transportation;Help with stairs or ramp for entrance    Functional Status Assessment  Patient has had a recent decline in their functional status and demonstrates the ability to make significant improvements in function in a reasonable and predictable amount of time.  Equipment Recommendations  Other (comment) (defer to next venue of care)       Precautions / Restrictions Precautions Precautions: Fall Restrictions Weight Bearing Restrictions: No      Mobility Bed Mobility   Bed  Mobility: Supine to Sit, Sit to Supine     Supine to sit: Mod assist Sit to supine: Mod assist        Transfers Overall transfer level: Needs assistance   Transfers: Sit to/from Stand Sit to Stand: Min assist                  Balance Overall balance assessment: Needs assistance Sitting-balance support: Feet supported, Single extremity supported Sitting balance-Leahy Scale: Good     Standing balance support: Reliant on assistive device for balance, Bilateral upper extremity supported Standing balance-Leahy Scale: Fair                             ADL either performed or assessed with clinical judgement   ADL Overall ADL's : Needs assistance/impaired                     Lower Body Dressing: Maximal assistance Lower Body Dressing Details (indicate cue type and reason): max A to don B socks Toilet Transfer: Minimal assistance;Moderate assistance;BSC/3in1;Rolling walker (2 wheels) Toilet Transfer Details (indicate cue type and reason): simulated                 Vision Baseline Vision/History: 1 Wears glasses Patient Visual Report: No change from baseline           Extremity/Trunk Assessment Upper Extremity Assessment Upper Extremity Assessment: Generalized weakness   Lower Extremity Assessment Lower Extremity Assessment: Generalized weakness       Communication Communication Communication: No apparent difficulties Cueing Techniques: Tactile cues;Verbal cues   Cognition Arousal: Alert Behavior During Therapy: WFL for tasks assessed/performed Overall Cognitive Status: Within  Functional Limits for tasks assessed                                                  Home Living Family/patient expects to be discharged to:: Private residence Living Arrangements: Children Available Help at Discharge: Family;Available PRN/intermittently Type of Home: House Home Access: Ramped entrance     Home Layout: One level      Bathroom Shower/Tub: Sponge bathes at baseline   Bathroom Toilet: Standard Bathroom Accessibility: Yes   Home Equipment: Rolling Walker (2 wheels);BSC/3in1;Cane - single point;Wheelchair - manual   Additional Comments: Pt recently moved in with daughter ~ 3 weeks ago.      Prior Functioning/Environment Prior Level of Function : Needs assist       Physical Assist : Mobility (physical);ADLs (physical) Mobility (physical): Bed mobility;Transfers;Gait;Stairs   Mobility Comments: short distance ambulation with RW ADLs Comments: pt ind with ADLs/IADLs        OT Problem List: Decreased strength;Decreased activity tolerance;Decreased safety awareness;Impaired balance (sitting and/or standing);Decreased knowledge of use of DME or AE      OT Treatment/Interventions: Self-care/ADL training;Therapeutic exercise;Therapeutic activities;Energy conservation;Patient/family education;DME and/or AE instruction;Balance training    OT Goals(Current goals can be found in the care plan section) Acute Rehab OT Goals Patient Stated Goal: to go to rehab and get stronger OT Goal Formulation: With patient Time For Goal Achievement: 11/03/22 Potential to Achieve Goals: Fair  OT Frequency: Min 1X/week    Co-evaluation PT/OT/SLP Co-Evaluation/Treatment: Yes Reason for Co-Treatment: To address functional/ADL transfers PT goals addressed during session: Mobility/safety with mobility OT goals addressed during session: ADL's and self-care      AM-PAC OT "6 Clicks" Daily Activity     Outcome Measure Help from another person eating meals?: None Help from another person taking care of personal grooming?: A Little Help from another person toileting, which includes using toliet, bedpan, or urinal?: A Lot Help from another person bathing (including washing, rinsing, drying)?: A Lot Help from another person to put on and taking off regular upper body clothing?: A Little Help from another person to put  on and taking off regular lower body clothing?: A Lot 6 Click Score: 16   End of Session Equipment Utilized During Treatment: Rolling walker (2 wheels) Nurse Communication: Mobility status  Activity Tolerance: Patient limited by fatigue;Patient tolerated treatment well Patient left: in bed  OT Visit Diagnosis: Unsteadiness on feet (R26.81);Repeated falls (R29.6);Muscle weakness (generalized) (M62.81)                Time: 1610-9604 OT Time Calculation (min): 18 min Charges:  OT General Charges $OT Visit: 1 Visit OT Evaluation $OT Eval Moderate Complexity: 1 [address] [place], OTR/L , CBIS ascom [phone]  10/20/22, 4:34 PM

## 2022-10-21 ENCOUNTER — Inpatient Hospital Stay: Payer: Medicare Other | Admitting: Radiology

## 2022-10-21 DIAGNOSIS — A419 Sepsis, unspecified organism: Secondary | ICD-10-CM | POA: Diagnosis not present

## 2022-10-21 DIAGNOSIS — R7881 Bacteremia: Secondary | ICD-10-CM | POA: Diagnosis not present

## 2022-10-21 DIAGNOSIS — C9000 Multiple myeloma not having achieved remission: Secondary | ICD-10-CM

## 2022-10-21 DIAGNOSIS — L03113 Cellulitis of right upper limb: Secondary | ICD-10-CM | POA: Diagnosis not present

## 2022-10-21 DIAGNOSIS — R531 Weakness: Secondary | ICD-10-CM | POA: Diagnosis not present

## 2022-10-21 HISTORY — PX: IR REMOVAL TUN ACCESS W/ PORT W/O FL MOD SED: IMG2290

## 2022-10-21 LAB — CBC WITH DIFFERENTIAL/PLATELET
Abs Immature Granulocytes: 0.01 10*3/uL (ref 0.00–0.07)
Basophils Absolute: 0 10*3/uL (ref 0.0–0.1)
Basophils Relative: 0 %
Eosinophils Absolute: 0 10*3/uL (ref 0.0–0.5)
Eosinophils Relative: 0 %
HCT: 31.9 % — ABNORMAL LOW (ref 39.0–52.0)
Hemoglobin: 10.1 g/dL — ABNORMAL LOW (ref 13.0–17.0)
Immature Granulocytes: 1 %
Lymphocytes Relative: 40 %
Lymphs Abs: 0.8 10*3/uL (ref 0.7–4.0)
MCH: 30 pg (ref 26.0–34.0)
MCHC: 31.7 g/dL (ref 30.0–36.0)
MCV: 94.7 fL (ref 80.0–100.0)
Monocytes Absolute: 0.4 10*3/uL (ref 0.1–1.0)
Monocytes Relative: 19 %
Neutro Abs: 0.8 10*3/uL — ABNORMAL LOW (ref 1.7–7.7)
Neutrophils Relative %: 40 %
Platelets: 86 10*3/uL — ABNORMAL LOW (ref 150–400)
RBC: 3.37 MIL/uL — ABNORMAL LOW (ref 4.22–5.81)
RDW: 17 % — ABNORMAL HIGH (ref 11.5–15.5)
Smear Review: NORMAL
WBC Morphology: INCREASED
WBC: 2 10*3/uL — ABNORMAL LOW (ref 4.0–10.5)
nRBC: 0 % (ref 0.0–0.2)

## 2022-10-21 LAB — BASIC METABOLIC PANEL
Anion gap: 5 (ref 5–15)
BUN: 28 mg/dL — ABNORMAL HIGH (ref 8–23)
CO2: 24 mmol/L (ref 22–32)
Calcium: 7.3 mg/dL — ABNORMAL LOW (ref 8.9–10.3)
Chloride: 108 mmol/L (ref 98–111)
Creatinine, Ser: 0.61 mg/dL (ref 0.61–1.24)
GFR, Estimated: >60 mL/min (ref >60)
Glucose, Bld: 114 mg/dL — ABNORMAL HIGH (ref 70–99)
Potassium: 3.7 mmol/L (ref 3.5–5.1)
Sodium: 137 mmol/L (ref 135–145)

## 2022-10-21 MED ORDER — MIDAZOLAM HCL 5 MG/5ML IJ SOLN
INTRAMUSCULAR | Status: AC | PRN
  Administered 2022-10-21: .5 mg via INTRAVENOUS

## 2022-10-21 MED ORDER — LIDOCAINE-EPINEPHRINE 1 %-1:100000 IJ SOLN
INTRAMUSCULAR | Status: AC
  Filled 2022-10-21: qty 1

## 2022-10-21 MED ORDER — FENTANYL CITRATE (PF) 100 MCG/2ML IJ SOLN
INTRAMUSCULAR | Status: AC
  Filled 2022-10-21: qty 2

## 2022-10-21 MED ORDER — FENTANYL CITRATE (PF) 100 MCG/2ML IJ SOLN
INTRAMUSCULAR | Status: AC | PRN
  Administered 2022-10-21: 25 ug via INTRAVENOUS

## 2022-10-21 MED ORDER — LIDOCAINE-EPINEPHRINE 1 %-1:100000 IJ SOLN
20.0000 mL | Freq: Once | INTRAMUSCULAR | Status: AC
  Administered 2022-10-21: 5 mL via INTRADERMAL
  Filled 2022-10-21: qty 20

## 2022-10-21 MED ORDER — MIDAZOLAM HCL 2 MG/2ML IJ SOLN
INTRAMUSCULAR | Status: AC
  Filled 2022-10-21: qty 2

## 2022-10-21 NOTE — Progress Notes (Signed)
Progress Note   Patient: Dean [name]. YNW:[id number] DOB: 1935/08/12 DOA: 10/17/2022     4 DOS: the patient was seen and examined on 10/21/2022    Subjective:  Patient seen and examined in the presence of the daughter He admits to improvement in his general condition as well as right arm cellulitis Denies nausea vomiting abdominal pain chest pain     Brief hospital course: From HPI "Dean [name]. is a 87 y.o. male with medical history significant of MM on pomalidomide, BPH with recurrent UTIs, chronic HFpEF, PAF on Eliquis, mild/moderate pulmonary hypertension, anxiety/depression, brought in by family member for evaluation of generalized weakness, right-sided arm rash and swelling and pain.  Patient noted to have sepsis secondary to UTI as well as right arm cellulitis with diastolic heart failure exacerbation and therefore admitted for further management.   Assessment and Plan: Sepsis secondary to right arm cellulitis as well as urinary tract infection Acinetobacter bacteremia -Evidenced by leukopenia, with WBC 1.8 heart rate as high as 110 tachycardia elevated lactate source of infection is likely combined UTI and right arm cellulitis Blood culture results taken on 10/17/2022 growing Acinetobacter as well as baumannii.  Repeat cultures taken on 10/18/2022 shows no growth at this time I have consulted infectious disease who has recommended removal of right-sided port I have also discussed the case with infectious disease as well as oncologist who agrees for removal of the port. Patient is to have port removal today Antibiotics have been changed to Unasyn We appreciate the input of infectious disease   Complicated UTI Continue current antibiotics   Right arm cellulitis Continue management as above     Potassium-continue potassium replacement and monitoring   Chronic persistent atrial fibrillation with rapid ventricular response -Likely secondary to sepsis -Given the patient  also has signs of active CHF decompensation, decided to give 1 dose of digoxin. -Continue metoprolol 25 g p.o. twice daily -Add as needed Lopressor for breakthrough heart rate -CCB contraindicated as patient's blood pressure is low. Continue IV Lasix Continue to monitor daily weight Continue to monitor input and output Initially patient's daughter wanted Eliquis withheld on account of low platelets level and redness noted to the right arm.  I discussed with Dr. Orlie [name] oncologist who said usually his platelets cut off is 50.  I discussed this with patient's daughter again as platelets has now improved and she has agreed for Eliquis to be resumed after port removal. Please resume Eliquis tomorrow     Acute on chronic HFpEF decompensation -Likely secondary to poorly controlled A-fib, management as above We have initiated Lasix as blood pressure allows Continue current IV Lasix   History of urethral rupture -Family reported that the patient urethra is fragile for straight cath and/or Foley, as he has a history of ureteral rupture on last admission when insertion of foley for UA collection resulted in the third rapture requiring prolonged hospital stay for urology repair.    Pancytopenia likely secondary to multiple myeloma -Acute phase is likely to related to sepsis, chronically patient has MM on chronic chemotherapy Outpatient follow-up with oncologist Monitor CBC and transfuse as needed   Multiple myeloma -Continue pomalidomide -Outpatient follow-up with oncology -Continue dexamethasone-I have reached out to Dr. Gerarda [name] and he confirmed that they placed him on it for appetite and so we can taper this off. Dexamethasone is therefore being tapered off   Anxiety/depression Continue SSRI   DVT prophylaxis: SCD at this time given thrombocytopenia, holding Eliquis  Code Status: DNR   Family Communication: Wife at bedside   Disposition Plan: Patient is sick with sepsis and  CHF decompensation requiring IV antibiotics and further CHF management after resolution of sepsis, expect more than 2 midnight hospital   Consults called: None       Physical Exam: Eyes: PERRL, lids and conjunctivae normal ENMT: Mucous membranes are moist.  Neck: normal, supple, no masses, no thyromegaly Respiratory: Decreased air entry especially at the bases Cardiovascular: Irregularly irregular Abdomen: no tenderness, no masses palpated.  Musculoskeletal: no clubbing / cyanosis.  Skin: no rashes, lesions, ulcers. No induration Neurologic: CN 2-12 grossly intact. Sensation intact Psychiatric: Normal judgment and insight.    Data Reviewed: I have reviewed patient's labs including CBC and BMP, I have reviewed patient's vitals as well as radiology documentation PT OT documentation and nursing documentation     Latest Ref Rng & Units 10/21/2022    5:00 AM 10/20/2022    4:18 AM 10/19/2022    3:20 AM  CBC  WBC 4.0 - 10.5 K/uL 2.0  1.8  2.0   Hemoglobin 13.0 - 17.0 g/dL 62.9  9.9  9.9   Hematocrit 39.0 - 52.0 % 31.9  31.1  31.0   Platelets 150 - 400 K/uL 86  70  69     Vitals:   10/21/22 1215 10/21/22 1230 10/21/22 1304 10/21/22 1600  BP: 116/79 114/68 117/61 126/72  Pulse: 75 82 84 (!) 109  Resp: 15 (!) 22 16 20   Temp:   97.7 F (36.5 C) 97.8 F (36.6 C)  TempSrc:    Oral  SpO2: 97% 94% 98% 97%  Weight:      Height:         Author: Loyce Dys, MD 10/21/2022 5:16 PM  For on call review [email or web address].

## 2022-10-21 NOTE — Progress Notes (Addendum)
Date of Admission:  10/17/2022      ID: Dean [name]. is a 87 y.o. male Principal Problem:   Sepsis (HCC) Active Problems:   Multiple myeloma without remission (HCC)   Sepsis due to cellulitis (HCC)   Leukopenia   Paroxysmal A-fib (HCC)   Pt admitted with weakness and falls Found to have acinetobacter bacteremia Subjective: Doing better On antibiotic PORT removed today   He has h/o urinary retention during a recent hospitalization in July and had a coude , complicated by hematuria . As per note he had urinary retention of 1 L He had cystoscopy and had foley cath which was removed Aug 2024  Medications:   dexamethasone  2 mg Oral Daily   Followed by   Melene Muller ON 10/24/2022] dexamethasone  1 mg Oral Daily   furosemide  20 mg Intravenous BID   gabapentin  400 mg Oral BID   influenza vaccine adjuvanted  0.5 mL Intramuscular Tomorrow-1000   metoprolol tartrate  12.5 mg Oral BID   montelukast  10 mg Oral QHS   tamsulosin  0.4 mg Oral QPC supper   traZODone  300 mg Oral QHS    Objective: Vital signs in last 24 hours: Patient Vitals for the past 24 hrs:  BP Temp Temp src Pulse Resp SpO2 Weight  10/21/22 1230 114/68 -- -- 82 (!) 22 94 % --  10/21/22 1215 116/79 -- -- 75 15 97 % --  10/21/22 1206 97/65 -- -- 75 14 100 % --  10/21/22 1200 93/64 -- -- 73 16 100 % --  10/21/22 1155 (!) 118/47 -- -- 72 14 100 % --  10/21/22 1103 113/62 (!) 97.5 F (36.4 C) Oral 79 18 96 % --  10/21/22 0848 137/70 97.9 F (36.6 C) Oral 68 18 96 % --  10/21/22 0437 126/67 98.1 F (36.7 C) Oral 78 18 98 % 89.5 kg  10/20/22 2331 (!) 149/68 (!) 97.5 F (36.4 C) Oral 88 18 99 % --  10/20/22 1959 (!) 130/53 97.9 F (36.6 C) Oral 87 18 100 % --  10/20/22 1542 (!) 114/52 97.7 F (36.5 C) Oral 74 20 95 % --      PHYSICAL EXAM:  General: Alert, cooperative, no distress, appears stated age.  Head: Normocephalic, without obvious abnormality, atraumatic. Eyes: Conjunctivae clear, anicteric  sclerae. Pupils are equal ENT Nares normal. No drainage or sinus tenderness. Lips, mucosa, and tongue normal. No Thrush Neck: Supple, symmetrical, no adenopathy, thyroid: non tender no carotid bruit and no JVD. Back: No CVA tenderness. Lungs: Clear to auscultation bilaterally. No Wheezing or Rhonchi. No rales. Heart: Regular rate and rhythm, no murmur, rub or gallop. Abdomen: Soft, non-tender,not distended. Bowel sounds normal. No masses Extremities:b/l forearm bruising and skin tear Skin: No rashes or lesions. Or bruising Lymph: Cervical, supraclavicular normal. Neurologic: Grossly non-focal  Lab Results    Latest Ref Rng & Units 10/21/2022    5:00 AM 10/20/2022    4:18 AM 10/19/2022    3:20 AM  CBC  WBC 4.0 - 10.5 K/uL 2.0  1.8  2.0   Hemoglobin 13.0 - 17.0 g/dL 82.9  9.9  9.9   Hematocrit 39.0 - 52.0 % 31.9  31.1  31.0   Platelets 150 - 400 K/uL 86  70  69        Latest Ref Rng & Units 10/21/2022    5:00 AM 10/20/2022    4:18 AM 10/19/2022    3:20 AM  CMP  Glucose 70 - 99 mg/dL 657  92  846   BUN 8 - 23 mg/dL 28  29  22    Creatinine 0.61 - 1.24 mg/dL 9.62  9.52  8.41   Sodium 135 - 145 mmol/L 137  135  135   Potassium 3.5 - 5.1 mmol/L 3.7  3.6  4.1   Chloride 98 - 111 mmol/L 108  106  107   CO2 22 - 32 mmol/L 24  24  23    Calcium 8.9 - 10.3 mg/dL 7.3  7.2  7.4       Microbiology: BC - acinetobacter bacteremia Studies/Results:    Assessment/Plan: Acinetobacter bacteremia- on unasyn  source could be skin- possible cellulitis rt arm PORT has been removed today Need to r/o Urinary source Will get post void bladder scan- asked lab to ID the organism in urine culture  H/o BPH and urinary retention In July 2024 with coude, hematuria Says he has no urinary symptoms now  Weakness and falls  Pancytopenia  Multiple myeloma on pomalidomide   Discussed the management with patient and care team

## 2022-10-21 NOTE — Progress Notes (Signed)
PT Cancellation Note  Patient Details Name: Dean [name]. MRN: [id number] DOB: Sep 12, 1935   Cancelled Treatment:     Attempted PT visit but Pt was using bed pan at the time and was not ready for session. Will attempt PT visit at a later time.   Michal Callicott 10/21/2022, 4:26 PM

## 2022-10-21 NOTE — Consult Note (Signed)
Chief Complaint: Concern for infection. Request is for portacath removal.   Referring Physician(s): Dr. Farrel [place]  Supervising Physician: Mir, Mauri Reading  Patient Status: [facility] - In-pt  History of Present Illness: Dean [name]. is a 87 y.o. male inpatient. History of multiple myeloma, BPH with chronic UTI's, CHF, A fib (on eliquis). Presented to the ED at [facility] on 9.13.24 with generalized weakness and right arm swelling. Found to be septic secondary to UTI with right arm cellulitis, CHF exacerbation and a fib in RVR. Team is requesting a RIJ port removal for infection. Portacath placed on 8.23.29 by IR.   Patient alert and laying in bed,calm. Endorses right leg pain overnight that was resolved with pain medication. Denies any fevers, headache, chest pain, SOB, cough, abdominal pain, nausea, vomiting or bleeding. Return precautions and treatment recommendations and follow-up discussed with the patient  who is agreeable with the plan.    Past Medical History:  Diagnosis Date   BPH (benign prostatic hyperplasia)    Colonic diverticular abscess 01/08/2015   Diverticulitis 12/22/2014   complicated by abscess and required percutaneous drainage   GERD (gastroesophageal reflux disease)    H/O ETOH abuse    History of chemotherapy last done jan 2017   History of radiation therapy 01/05/13-02/10/13   45 gray to left occipital condyle region   History of radiation therapy 12/01/16-12/10/16   Parasternal nodule, chest- 24 Gy total delivered in 8 fractions, Left sacro-iliac, pelvis- 24 Gy total delivered in 8 fractions    History of radiation therapy 02/19/17-02/22/17   right temporal scalp 30 Gy in 10 fractions   History of radiation therapy    Right hip( Pelvis) 11/18/21-11/21/21-Dr. Antony Blackbird   Intra-abdominal abscess (HCC)    Multiple myeloma (HCC) 2015   Neuropathy    Radiation 02/21/14-03/08/14   right posterior chest wall area 30 gray   Radiation 04/19/14-05/02/14   lumbar spine  25 gray   Skull lesion    Left occipital condyle   Wrist fracture, left    x 2    Past Surgical History:  Procedure Laterality Date   COLONOSCOPY N/A 04/19/2015   Procedure: COLONOSCOPY;  Surgeon: West Bali, MD;  Location: [facility];  Service: Endoscopy;  Laterality: N/A;  1:30 PM   CYST EXCISION  1959   tail bone   CYSTOSCOPY WITH FULGERATION N/A 08/20/2022   Procedure: CYSTOSCOPY WITH FULGERATION;  Surgeon: Crista Elliot, MD;  Location: [facility];  Service: Urology;  Laterality: N/A;  cysto clot evac   IR IMAGING GUIDED PORT INSERTION  09/25/2017    Allergies: Codeine, Morphine and codeine, and Revlimid [lenalidomide]  Medications: Prior to Admission medications   Medication Sig Start Date End Date Taking? Authorizing Provider  acetaminophen (TYLENOL) 500 MG tablet Take 500-1,000 mg by mouth every 6 (six) hours as needed for mild pain or moderate pain.   Yes [provider]  ALPRAZolam (XANAX) 1 MG tablet TAKE ONE TABLET (1 MG TOTAL) BY MOUTH THREE (THREE) TIMES DAILY AS NEEDED. FOR ANXIETY Patient taking differently: Take 2 mg by mouth at bedtime as needed for anxiety or sleep. 07/28/22  Yes Borders, Daryl Eastern, NP  Calcium Carbonate (CALCIUM 500 PO) Take 750 mg by mouth daily.   Yes [provider]  cyanocobalamin (VITAMIN B12) 500 MCG tablet Take 1 tablet (500 mcg total) by mouth daily. 02/13/22  Yes Tat, Onalee Hua, MD  dexamethasone (DECADRON) 4 MG tablet Take 1 tablet (4 mg total) by mouth  every other day. 09/02/22  Yes Finnegan, Tollie Pizza, MD  ELIQUIS 5 MG TABS tablet TAKE ONE TABLET BY MOUTH TWICE DAILY 08/29/22  Yes Jeralyn Ruths, MD  ferrous sulfate 325 (65 FE) MG tablet Take 325 mg by mouth daily.   Yes [provider]  folic acid (FOLVITE) 1 MG tablet Take 1 tablet (1 mg total) by mouth daily. 05/19/22  Yes Jeralyn Ruths, MD  furosemide (LASIX) 20 MG tablet Take 1 tablet (20 mg total) by mouth daily as needed. Patient taking  differently: Take 20 mg by mouth every other day. 04/22/22  Yes Jeralyn Ruths, MD  gabapentin (NEURONTIN) 400 MG capsule Take 1 capsule (400 mg total) by mouth 3 (three) times daily. Patient taking differently: Take 400 mg by mouth 2 (two) times daily. 04/21/22  Yes Jeralyn Ruths, MD  guaiFENesin-dextromethorphan (ROBITUSSIN DM) 100-10 MG/5ML syrup Take 5 mLs by mouth every 4 (four) hours as needed for cough (chest congestion). 08/24/22  Yes Meredeth Ide, MD  lidocaine-prilocaine (EMLA) cream Apply small amount over port one (1) hour prior to appointment. 09/17/19  Yes Doreatha Massed, MD  Melatonin 10 MG TABS Take 10 mg by mouth at bedtime.   Yes [provider]  metoprolol tartrate (LOPRESSOR) 25 MG tablet Take 0.5 tablets (12.5 mg total) by mouth 2 (two) times daily. 08/24/22  Yes Lama, Sarina Ill, MD  montelukast (SINGULAIR) 10 MG tablet Take 1 tablet (10 mg total) by mouth at bedtime. 09/30/22  Yes Donita Brooks, MD  Oxycodone HCl 10 MG TABS Take 1 tablet (10 mg total) by mouth 3 (three) times daily as needed. Okay to take 1 or 2 tablets as needed. Patient taking differently: Take 10-20 mg by mouth 3 (three) times daily as needed. 09/25/22  Yes Jeralyn Ruths, MD  pomalidomide (POMALYST) 3 MG capsule Take 1 capsule (3 mg total) by mouth daily. Take for 21 days, then hold for 7 days. Repeat every 28 days. 09/29/22  Yes Jeralyn Ruths, MD  tamsulosin (FLOMAX) 0.4 MG CAPS capsule TAKE ONE CAPSULE BY MOUTH EVERY NIGHT ATBEDTIME 08/29/22  Yes Donita Brooks, MD  traZODone (DESYREL) 100 MG tablet TAKE THREE TABLETS BY MOUTH EVERY NIGHT AT BEDTIME Patient taking differently: Take 100-300 mg by mouth at bedtime. 07/28/22  Yes Borders, Daryl Eastern, NP  TURMERIC PO Take 1 tablet by mouth 2 (two) times daily.   Yes [provider]  fluticasone (FLONASE) 50 MCG/ACT nasal spray PLACE 2 SPRAYS INTO BOTH NOSTRILS DAILY Patient not taking: Reported on 10/17/2022 03/03/22    Donita Brooks, MD     Family History  Problem Relation Age of Onset   Diabetes Mother    Stroke Mother    Hypertension Father     Social History   Socioeconomic History   Marital status: Significant Other    Spouse name: Mary   Number of children: 3   Years of education: Not on file   Highest education level: Not on file  Occupational History    Employer: RETIRED  Tobacco Use   Smoking status: Never   Smokeless tobacco: Never  Vaping Use   Vaping status: Never Used  Substance and Sexual Activity   Alcohol use: No    Comment: " Not much no more"   Drug use: No   Sexual activity: Not on file  Other Topics Concern   Not on file  Social History Narrative   Not on file   Social  Determinants of Health   Financial Resource Strain: Low Risk  (03/21/2022)   Overall Financial Resource Strain (CARDIA)    Difficulty of Paying Living Expenses: Not very hard  Food Insecurity: No Food Insecurity (10/18/2022)   Hunger Vital Sign    Worried About Running Out of Food in the Last Year: Never true    Ran Out of Food in the Last Year: Never true  Transportation Needs: No Transportation Needs (10/18/2022)   PRAPARE - Administrator, Civil Service (Medical): No    Lack of Transportation (Non-Medical): No  Physical Activity: Inactive (01/09/2022)   Exercise Vital Sign    Days of Exercise per Week: 0 days    Minutes of Exercise per Session: 0 min  Stress: Stress Concern Present (01/09/2022)   [employer] of Occupational Health - Occupational Stress Questionnaire    Feeling of Stress : To some extent  Social Connections: Moderately Isolated (01/09/2022)   Social Connection and Isolation Panel [NHANES]    Frequency of Communication with Friends and Family: Three times a week    Frequency of Social Gatherings with Friends and Family: Three times a week    Attends Religious Services: Never    Active Member of Clubs or Organizations: No    Attends Banker  Meetings: Never    Marital Status: Living with partner     Review of Systems: A 12 point ROS discussed and pertinent positives are indicated in the HPI above.  All other systems are negative.  Review of Systems  Constitutional:  Negative for fever.  HENT:  Negative for congestion.   Respiratory:  Negative for cough and shortness of breath.   Cardiovascular:  Negative for chest pain.  Gastrointestinal:  Negative for abdominal pain.  Musculoskeletal:  Positive for myalgias (right lg pain overnight that resolved with pain medication).  Neurological:  Negative for headaches.  Psychiatric/Behavioral:  Negative for behavioral problems and confusion.     Vital Signs: BP 126/67 (BP Location: Left Arm)   Pulse 78   Temp 98.1 F (36.7 C) (Oral)   Resp 18   Ht 5\' 8"  (1.727 m)   Wt 197 lb 5 oz (89.5 kg)   SpO2 98%   BMI 30.00 kg/m   Advance Care Plan: The advanced care plan/surrogate decision maker was discussed at the time of visit and documented in the medical record.  daughter  Physical Exam Vitals and nursing note reviewed.  Constitutional:      Appearance: He is well-developed. He is obese.  HENT:     Head: Normocephalic.  Cardiovascular:     Rate and Rhythm: Normal rate and regular rhythm.     Heart sounds: Normal heart sounds.  Pulmonary:     Effort: Pulmonary effort is normal.     Breath sounds: Normal breath sounds.  Musculoskeletal:        General: Normal range of motion.     Cervical back: Normal range of motion.  Skin:    General: Skin is warm and dry.     Findings: Bruising (to right forearm) and erythema (to right forearm) present.  Neurological:     General: No focal deficit present.     Mental Status: He is alert and oriented to person, place, and time.  Psychiatric:        Mood and Affect: Mood normal.        Behavior: Behavior normal.        Thought Content: Thought content normal.  Judgment: Judgment normal.     Imaging: DG Chest 1  View  Result Date: 10/18/2022 CLINICAL DATA:  CHF. EXAM: CHEST  1 VIEW COMPARISON:  10/17/2022 FINDINGS: The cardio pericardial silhouette is enlarged. Vascular congestion with diffuse interstitial opacity suggesting edema. There is left base collapse/consolidation with small bilateral pleural effusions. Right Port-A-Cath again noted. Telemetry leads overlie the chest. IMPRESSION: 1. Vascular congestion with diffuse interstitial opacity suggesting edema. 2. Left base collapse/consolidation with small bilateral pleural effusions. Electronically Signed   By: Kennith Center M.D.   On: 10/18/2022 07:07   DG Forearm Right  Result Date: 10/17/2022 CLINICAL DATA:  Sepsis.  Laceration.  History of myeloma. EXAM: RIGHT FOREARM - 2 VIEW COMPARISON:  None Available. FINDINGS: No acute fracture or dislocation. Preserved adjacent joint spaces. Vascular calcifications. There are subcortical lucencies along the midshaft of the radius and ulna consistent with the patient's history of multiple myeloma. IMPRESSION: No acute osseous abnormality. Presumed myelomatous lesions involving the midshaft of the radius and ulna. Electronically Signed   By: Karen Kays M.D.   On: 10/17/2022 13:14   DG Chest Port 1 View  Result Date: 10/17/2022 CLINICAL DATA:  Questionable sepsis EXAM: PORTABLE CHEST 1 VIEW COMPARISON:  X-ray 08/23/2022 FINDINGS: Enlarged heart. Tortuous and ectatic aorta with stable widening of the mediastinum. Small pleural effusions with some adjacent opacity, left-greater-than-right. Increasing on the left compared to previous. Is also increasing vascular congestion and interstitial thickening. Old left-sided rib fractures. Osteopenia. Overlapping cardiac leads. Stable right IJ chest port with tip at the central SVC. IMPRESSION: Chest port. Enlarged heart with increasing vascular congestion and some interstitial changes. Increasing left retrocardiac opacity.  Persistent small effusions. Recommend follow-up  Electronically Signed   By: Karen Kays M.D.   On: 10/17/2022 13:13   CT Head Wo Contrast  Result Date: 10/17/2022 CLINICAL DATA:  Provided history: Facial trauma, blunt. Head trauma. Multiple recent falls (on anticoagulation). Weakness. Additional history obtained from electronic MEDICAL RECORD NUMBERHistory of multiple myeloma. EXAM: CT HEAD WITHOUT CONTRAST TECHNIQUE: Contiguous axial images were obtained from the base of the skull through the vertex without intravenous contrast. RADIATION DOSE REDUCTION: This exam was performed according to the departmental dose-optimization program which includes automated exposure control, adjustment of the mA and/or kV according to patient size and/or use of iterative reconstruction technique. COMPARISON:  Brain MRI 09/06/2018.  CT angiogram head 11/29/2012. FINDINGS: Brain: Generalized cerebral atrophy. Patchy and ill-defined hypoattenuation within the cerebral white matter, nonspecific but compatible with mild chronic small vessel ischemic disease. There is no acute intracranial hemorrhage. No demarcated cortical infarct. No extra-axial fluid collection. No evidence of an intracranial mass. No midline shift. Vascular: No hyperdense vessel.  Atherosclerotic calcifications. Skull: No calvarial fracture. Known previously treated lesion within the left occipital calvarium. Fairly numerous lucent lesions scattered elsewhere within the bilateral calvarium, which are new from the prior head CT of 11/29/2012. Additionally, there is a new 14 mm expansile/destructive osseous lesion (with soft tissue components) in the anterior aspect of the right zygomatic arch, encroaching upon the lateral aspect of the right maxillary sinus (series 3, image 9). Sinuses/Orbits: No orbital mass or acute orbital finding. Mild mucosal thickening, and small fluid level, within the right sphenoid sinus. Partial opacification of the right sphenoethmoidal recess. IMPRESSION: 1. No evidence of an acute  intracranial abnormality. 2. Mild chronic small vessel ischemic changes within the cerebral white matter. 3. Generalized cerebral atrophy. 4. Known previously treated lesion within the left occipital calvarium. 5. Fairly numerous lucent  lesions scattered elsewhere within the bilateral calvarium which are new from the prior head CT of 11/29/2012. Additionally, there is a new 14 mm expansile/destructive osseous lesion (with soft tissue components) in the anterior aspect of the right zygomatic arch, encroaching upon the lateral aspect of the right maxillary sinus. These new lesions likely reflect sequelae of multiple myeloma given the patient's history. 6. Right sphenoid sinusitis. Electronically Signed   By: Jackey Loge D.O.   On: 10/17/2022 13:07    Labs:  CBC: Recent Labs    10/18/22 0254 10/19/22 0320 10/20/22 0418 10/21/22 0500  WBC 1.8* 2.0* 1.8* 2.0*  HGB 9.8* 9.9* 9.9* 10.1*  HCT 30.8* 31.0* 31.1* 31.9*  PLT 74* 69* 70* 86*    COAGS: Recent Labs    08/19/22 1110 10/17/22 1352  INR 1.5* 1.5*  APTT 31 28    BMP: Recent Labs    10/18/22 0254 10/19/22 0320 10/20/22 0418 10/21/22 0500  NA 134* 135 135 137  K 3.4* 4.1 3.6 3.7  CL 105 107 106 108  CO2 24 23 24 24   GLUCOSE 96 118* 92 114*  BUN 27* 22 29* 28*  CALCIUM 7.3* 7.4* 7.2* 7.3*  CREATININE 0.62 0.47* 0.50* 0.61  GFRNONAA >60 >60 >60 >60    LIVER FUNCTION TESTS: Recent Labs    08/24/22 0322 09/09/22 0947 09/29/22 0949 10/17/22 1352  BILITOT 0.5 0.4 0.3 0.7  AST 11* 14* 16 16  ALT 13 9 12 12   ALKPHOS 20* 32* 28* 17*  PROT 5.8* 6.0* 6.6 6.0*  ALBUMIN 1.8* 2.3* 2.4* 2.3*     Assessment and Plan:  87 y.o. male inpatient. History of multiple myeloma, BPH with chronic UTI's, CHF, A fib ( on eliquis). Presented to the ED at [facility] on 9.13.24 with generalized weakness and right arm swelling. Found to be septic secondary to UTI with right arm cellulitis, CHF exacerbation and a fib in RVR. Team is requesting a  RIJ port removal for infection Portacath placed on 8.23.29 by IR.   WBC is 2.0, PLT 86. Patient is on eliquis. Last dose given on 9.16.24 @ 8:06. Blood cultures show no growth in 2 days. Blood cultures from 9.13.26 positive on 9.16.24 for acinetobacter calcoaceticus/ baumannii complex. Allergies include codeine and morphine. Patient has been NPO since midnight.  Risks and benefits of image guided port-a-catheter removal was discussed with the patient including, but not limited to bleeding, infection, pneumothorax, or fibrin sheath development and need for additional procedures.  All of the patient's questions were answered, patient is agreeable to proceed. Consent signed and in chart.   Thank you for this interesting consult.  I greatly enjoyed meeting Kingsly Scurry. and look forward to participating in their care.  A copy of this report was sent to the requesting provider on this date.  Electronically Signed: Alene Mires, NP 10/21/2022, 8:25 AM   I spent a total of 20 Minutes    in face to face in clinical consultation, greater than 50% of which was counseling/coordinating care for portacath removal

## 2022-10-21 NOTE — Plan of Care (Signed)
  Problem: Health Behavior/Discharge Planning: Goal: Ability to manage health-related needs will improve Outcome: Not Progressing   Problem: Clinical Measurements: Goal: Respiratory complications will improve Outcome: Progressing   Problem: Activity: Goal: Risk for activity intolerance will decrease Outcome: Not Progressing

## 2022-10-21 NOTE — Procedures (Signed)
Interventional Radiology Procedure Note  Procedure: Port placement.  Indication: Bacteremia  Findings: Please refer to procedural dictation for full description.  Complications: None  EBL: < 10 mL  Acquanetta Belling, MD [phone]

## 2022-10-22 ENCOUNTER — Other Ambulatory Visit: Payer: Self-pay | Admitting: *Deleted

## 2022-10-22 DIAGNOSIS — R7881 Bacteremia: Secondary | ICD-10-CM | POA: Diagnosis not present

## 2022-10-22 DIAGNOSIS — R6520 Severe sepsis without septic shock: Secondary | ICD-10-CM

## 2022-10-22 DIAGNOSIS — L03113 Cellulitis of right upper limb: Secondary | ICD-10-CM | POA: Diagnosis not present

## 2022-10-22 DIAGNOSIS — N179 Acute kidney failure, unspecified: Secondary | ICD-10-CM

## 2022-10-22 DIAGNOSIS — A4154 Sepsis due to Acinetobacter baumannii: Secondary | ICD-10-CM | POA: Diagnosis not present

## 2022-10-22 DIAGNOSIS — C9000 Multiple myeloma not having achieved remission: Secondary | ICD-10-CM

## 2022-10-22 LAB — URINE CULTURE

## 2022-10-22 MED ORDER — POLYETHYLENE GLYCOL 3350 17 G PO PACK
17.0000 g | PACK | Freq: Every day | ORAL | Status: DC | PRN
  Administered 2022-10-23: 17 g via ORAL
  Filled 2022-10-22: qty 1

## 2022-10-22 MED ORDER — APIXABAN 5 MG PO TABS
5.0000 mg | ORAL_TABLET | Freq: Two times a day (BID) | ORAL | Status: DC
  Administered 2022-10-22 – 2022-10-24 (×4): 5 mg via ORAL
  Filled 2022-10-22 (×4): qty 1

## 2022-10-22 MED ORDER — ALPRAZOLAM 0.5 MG PO TABS
2.0000 mg | ORAL_TABLET | Freq: Every day | ORAL | Status: DC
  Administered 2022-10-22 – 2022-10-23 (×2): 2 mg via ORAL
  Filled 2022-10-22 (×2): qty 4

## 2022-10-22 MED ORDER — DOCUSATE SODIUM 100 MG PO CAPS
200.0000 mg | ORAL_CAPSULE | Freq: Two times a day (BID) | ORAL | Status: DC
  Administered 2022-10-22 – 2022-10-24 (×4): 200 mg via ORAL
  Filled 2022-10-22 (×5): qty 2

## 2022-10-22 NOTE — Progress Notes (Signed)
OT Cancellation Note  Patient Details Name: Dean [name]. MRN: [id number] DOB: 1935/06/10   Cancelled Treatment:    Reason Eval/Treat Not Completed: Fatigue/lethargy limiting ability to participate (pt stating he mobilized with PT this morning; did not sleep well last night and would like to take a nap right now. Family member at bedside.)  Alvester Morin 10/22/2022, 1:33 PM

## 2022-10-22 NOTE — Progress Notes (Signed)
Physical Therapy Treatment Patient Details Name: Dean [name]. MRN: [id number] DOB: 1935/07/30 Today's Date: 10/22/2022   History of Present Illness 87 y.o. male with medical history significant of MM on pomalidomide, BPH with recurrent UTIs, chronic HFpEF, PAF on Eliquis, mild/moderate pulmonary hypertension, anxiety/depression, brought in by family member for evaluation of generalized weakness, right-sided arm rash and swelling and pain.  Patient noted to have sepsis secondary to UTI as well as right arm cellulitis with diastolic heart failure exacerbation and therefore admitted for further management.    PT Comments  Pt is steadily progressing towards PT goals. Pt is received in bed, he is agreeable to PT session. Pt performs bed mobility min A for scooting towards EOB and transfers min A with use of RW. Pt able to demonstrate improved bed mobility from prior visit but cont to require cuing to use hand railings to facilitate task. During step pivot transfer to recliner, Pt required cuing for AD management and hand placement on RW for safety. Overall, Pt tolerated session well with no reports of increase low back pain or L knee discomfort. Pt would benefit from cont skilled PT to address above deficits and promote optimal return to PLOF.    If plan is discharge home, recommend the following: A lot of help with bathing/dressing/bathroom;Assist for transportation;Help with stairs or ramp for entrance;A lot of help with walking and/or transfers   Can travel by private vehicle     No  Equipment Recommendations  None recommended by PT    Recommendations for Other Services       Precautions / Restrictions Precautions Precautions: Fall Restrictions Weight Bearing Restrictions: No     Mobility  Bed Mobility Overal bed mobility: Needs Assistance Bed Mobility: Supine to Sit     Supine to sit: Min assist     General bed mobility comments: Pt able to initiate bed mobility requiring  min A for completion of task; frequent cuing for hand placement to facilitate task    Transfers Overall transfer level: Needs assistance Equipment used: Rolling walker (2 wheels) Transfers: Bed to chair/wheelchair/BSC     Step pivot transfers: Min assist, From elevated surface       General transfer comment: Pt demonstrates improved effort during STS and step pivot transfer; cuing for AD management and hand placement    Ambulation/Gait               General Gait Details: Not assessed during visit due to increased fatigue   Stairs             Wheelchair Mobility     Tilt Bed    Modified Rankin (Stroke Patients Only)       Balance Overall balance assessment: Needs assistance Sitting-balance support: Feet supported Sitting balance-Leahy Scale: Good Sitting balance - Comments: Able to maintain seated EOB balance with no notable difficulties   Standing balance support: Reliant on assistive device for balance, Bilateral upper extremity supported, During functional activity Standing balance-Leahy Scale: Fair Standing balance comment: flex forward posture which corrected with cuing; heavy reliance on RW                            Cognition Arousal: Alert Behavior During Therapy: WFL for tasks assessed/performed Overall Cognitive Status: Within Functional Limits for tasks assessed  General Comments: pleasant and cooperative during session        Exercises      General Comments        Pertinent Vitals/Pain Pain Assessment Pain Assessment: 0-10 Pain Score: 2  Pain Location: low back Pain Descriptors / Indicators: Aching, Discomfort Pain Intervention(s): Monitored during session, Repositioned    Home Living                          Prior Function            PT Goals (current goals can now be found in the care plan section) Acute Rehab PT Goals Patient Stated Goal: to get  better PT Goal Formulation: With patient Time For Goal Achievement: 11/03/22 Potential to Achieve Goals: Good Progress towards PT goals: Progressing toward goals    Frequency    Min 1X/week      PT Plan      Co-evaluation              AM-PAC PT "6 Clicks" Mobility   Outcome Measure  Help needed turning from your back to your side while in a flat bed without using bedrails?: A Little Help needed moving from lying on your back to sitting on the side of a flat bed without using bedrails?: A Little Help needed moving to and from a bed to a chair (including a wheelchair)?: A Little Help needed standing up from a chair using your arms (e.g., wheelchair or bedside chair)?: A Little Help needed to walk in hospital room?: A Lot Help needed climbing 3-5 steps with a railing? : Total 6 Click Score: 15    End of Session Equipment Utilized During Treatment: Gait belt Activity Tolerance: Patient tolerated treatment well Patient left: in chair;with call bell/phone within reach;with chair alarm set Nurse Communication: Mobility status PT Visit Diagnosis: Repeated falls (R29.6);Muscle weakness (generalized) (M62.81);Unsteadiness on feet (R26.81);Pain Pain - Right/Left: Left Pain - part of body: Knee     Time: 9528-4132 PT Time Calculation (min) (ACUTE ONLY): 12 min  Charges:                            Elmon Else, SPT    Sharita Bienaime 10/22/2022, 12:07 PM

## 2022-10-22 NOTE — Progress Notes (Signed)
Date of Admission:  10/17/2022      ID: Dean [name]. is a 87 y.o. male Principal Problem:   Sepsis (HCC) Active Problems:   Multiple myeloma without remission (HCC)   Sepsis due to cellulitis (HCC)   Leukopenia   Paroxysmal A-fib (HCC)   Bacteremia due to Gram-negative bacteria   Cellulitis of right arm   Pt admitted with weakness and falls Found to have acinetobacter bacteremia Subjective: Doing better Daughter at bedside No urinary symptoms  Medications:   ALPRAZolam  2 mg Oral QHS   dexamethasone  2 mg Oral Daily   Followed by   Melene Muller ON 10/24/2022] dexamethasone  1 mg Oral Daily   furosemide  20 mg Intravenous BID   gabapentin  400 mg Oral BID   influenza vaccine adjuvanted  0.5 mL Intramuscular Tomorrow-1000   metoprolol tartrate  12.5 mg Oral BID   montelukast  10 mg Oral QHS   tamsulosin  0.4 mg Oral QPC supper   traZODone  300 mg Oral QHS    Objective: Vital signs in last 24 hours: Patient Vitals for the past 24 hrs:  BP Temp Temp src Pulse Resp SpO2 Weight  10/22/22 1058 (!) 105/58 97.7 F (36.5 C) Oral 82 20 100 % --  10/22/22 0829 107/65 (!) 97.5 F (36.4 C) Oral (!) 58 20 96 % --  10/22/22 0424 (!) 119/56 97.6 F (36.4 C) Oral 67 18 98 % 86.7 kg  10/21/22 2344 124/68 97.7 F (36.5 C) Oral 67 18 97 % --  10/21/22 2155 125/79 -- -- 76 -- -- --  10/21/22 1948 121/68 97.7 F (36.5 C) Oral 86 -- 99 % --  10/21/22 1600 126/72 97.8 F (36.6 C) Oral (!) 109 20 97 % --      PHYSICAL EXAM:  General: Alert, cooperative, no distress, appears stated age.  Head: Normocephalic, without obvious abnormality, atraumatic. Eyes: Conjunctivae clear, anicteric sclerae. Pupils are equal ENT Nares normal. No drainage or sinus tenderness. Lips, mucosa, and tongue normal. No Thrush Neck: Supple, symmetrical, no adenopathy, thyroid: non tender no carotid bruit and no JVD. Back: No CVA tenderness. Lungs: Clear to auscultation bilaterally. No Wheezing or  Rhonchi. No rales. Heart: Regular rate and rhythm, no murmur, rub or gallop. Abdomen: Soft, non-tender,not distended. Bowel sounds normal. No masses Extremities:b/l forearm bruising and skin tear Skin: No rashes or lesions. Or bruising Lymph: Cervical, supraclavicular normal. Neurologic: Grossly non-focal  Lab Results    Latest Ref Rng & Units 10/22/2022    3:46 AM 10/21/2022    5:00 AM 10/20/2022    4:18 AM  CBC  WBC 4.0 - 10.5 K/uL 2.2  2.0  1.8   Hemoglobin 13.0 - 17.0 g/dL 56.2  13.0  9.9   Hematocrit 39.0 - 52.0 % 31.5  31.9  31.1   Platelets 150 - 400 K/uL 96  86  70        Latest Ref Rng & Units 10/22/2022    3:46 AM 10/21/2022    5:00 AM 10/20/2022    4:18 AM  CMP  Glucose 70 - 99 mg/dL 89  865  92   BUN 8 - 23 mg/dL 30  28  29    Creatinine 0.61 - 1.24 mg/dL 7.84  6.96  2.95   Sodium 135 - 145 mmol/L 139  137  135   Potassium 3.5 - 5.1 mmol/L 3.7  3.7  3.6   Chloride 98 - 111 mmol/L 108  108  106   CO2 22 - 32 mmol/L 25  24  24    Calcium 8.9 - 10.3 mg/dL 7.6  7.3  7.2       Microbiology: BC - acinetobacter bacteremia 9/13 Studies/Results:    Assessment/Plan: Acinetobacter bacteremia- on unasyn  source could be skin- possible cellulitis rt arm PORT has been removed today Urine culture has no Acinetobacter Postvoid bladder scan was 0 Patient is currently on Unasyn Will need total of 7 days of antibiotics End date is 10/24/2022 Patient has oral option.  On discharge can be sent on quinolone.  Weakness and falls History of hematuria secondary to traumatic catheterization few months ago Patient and daughter denies any urinary symptoms or urinary retention or BPH   Pancytopenia  Multiple myeloma on pomalidomide   Discussed the management with patient and his daughter and the hospitalist ID will sign off Call if needed

## 2022-10-22 NOTE — Consult Note (Signed)
ANTICOAGULATION CONSULT NOTE - Initial Consult  Pharmacy Consult for Eliquis Indication: atrial fibrillation  Allergies  Allergen Reactions   Codeine Other (See Comments)    Headache   Morphine And Codeine Other (See Comments)    Makes him feel weird   Revlimid [Lenalidomide] Other (See Comments)    "Causes me to become weak"    Patient Measurements: Height: 5\' 8"  (172.7 cm) Weight: 86.7 kg (191 lb 2.2 oz) IBW/kg (Calculated) : 68.4  Vital Signs: Temp: 97.5 F (36.4 C) (09/18 1546) Temp Source: Oral (09/18 1546) BP: 114/66 (09/18 1546) Pulse Rate: 80 (09/18 1546)  Labs: Recent Labs    10/20/22 0418 10/21/22 0500 10/22/22 0346  HGB 9.9* 10.1* 10.1*  HCT 31.1* 31.9* 31.5*  PLT 70* 86* 96*  CREATININE 0.50* 0.61 0.54*    Estimated Creatinine Clearance: 72.3 mL/min (A) (by C-G formula based on SCr of 0.54 mg/dL (L)).  Medical History: Past Medical History:  Diagnosis Date   BPH (benign prostatic hyperplasia)    Colonic diverticular abscess 01/08/2015   Diverticulitis 12/22/2014   complicated by abscess and required percutaneous drainage   GERD (gastroesophageal reflux disease)    H/O ETOH abuse    History of chemotherapy last done jan 2017   History of radiation therapy 01/05/13-02/10/13   45 gray to left occipital condyle region   History of radiation therapy 12/01/16-12/10/16   Parasternal nodule, chest- 24 Gy total delivered in 8 fractions, Left sacro-iliac, pelvis- 24 Gy total delivered in 8 fractions    History of radiation therapy 02/19/17-02/22/17   right temporal scalp 30 Gy in 10 fractions   History of radiation therapy    Right hip( Pelvis) 11/18/21-11/21/21-Dr. Antony Blackbird   Intra-abdominal abscess [facility])    Multiple myeloma (HCC) 2015   Neuropathy    Radiation 02/21/14-03/08/14   right posterior chest wall area 30 gray   Radiation 04/19/14-05/02/14   lumbar spine 25 gray   Skull lesion    Left occipital condyle   Wrist fracture, left    x 2     Assessment: 87 yo male with PMH relevant to MM on pomalidomide, BPH with recurrent UTIs, chronic HFpEF, PAF on Eliquis, mild/moderate pulmonary hypertension, anxiety/depression. Admitted for generalized weakness and sepsis. Patient has pancytopenia 2ry to multiple myeloma. Pharmacy consulted to resume Eliquis.  Hgb remains stable and slightly improved. Plt up from 70 to 96.    Plan:  Resume Eliquis 5mg  po BID (Afib; wt 86.7kg, Scr 0.54, age 87)  Letecia Arps Rodriguez-Guzman PharmD, BCPS 10/22/2022 5:48 PM

## 2022-10-22 NOTE — Progress Notes (Signed)
PROGRESS NOTE    [name].  FAO:[id number] DOB: December 11, 1935 DOA: 10/17/2022 PCP: Donita Brooks, MD   Assessment & Plan:   Principal Problem:   Sepsis [facility]) Active Problems:   Multiple myeloma without remission (HCC)   Sepsis due to cellulitis (HCC)   Leukopenia   Paroxysmal A-fib (HCC)   Bacteremia due to Gram-negative bacteria   Cellulitis of right arm  Assessment and Plan: Sepsis: secondary to right arm cellulitis, UTI & acinetobacter bacteremia. Continue on IV unasyn as per ID. Repeat blood cxs NGTD. Port removed. Abxs at d/c as per ID. Sepesis resolved  Bacteremia: blood cx growing acinetobacter. Repeat blood cxs NGTD. Continue on IV unasyn as per ID.    Complicated UTI: continue on IV unasyn    Right arm cellulitis: continue on IV unasyn   Hypokalemia: WNL today    Acute on chronic acute on chronic diastolic CHF: continue on IV lasix. Monitor I/Os  Chronic a. fib: continue on metoprolol. No on chronic anticoagulation   History of urethral rupture: urethra is fragile for straight cath and/or foley. Previous prolonged hospital stay for repair of rupture by urology    Pancytopenia: likely secondary to multiple myeloma & chemo. Will continue to monitor & transfuse prn   Multiple myeloma: continue on pomalidomide. Continue on steroid taper. F/u outpatient w/ onco    Anxiety: severity unknown. Continue on home dose of xanax        DVT prophylaxis: eliquis  Code Status: full  Family Communication: discussed pt's care w/ pt's family at bedside and answered their questions  Disposition Plan: likely d/c to SNF  Level of care: Progressive Status is: Inpatient Remains inpatient appropriate because: severity of illness    Consultants:  ID  Procedures:  Antimicrobials: unasyn   Subjective: Pt c/o insomnia   Objective: Vitals:   10/21/22 2155 10/21/22 2344 10/22/22 0424 10/22/22 0829  BP: 125/79 124/68 (!) 119/56 107/65  Pulse: 76 67 67 (!) 58   Resp:  18 18 20   Temp:  97.7 F (36.5 C) 97.6 F (36.4 C) (!) 97.5 F (36.4 C)  TempSrc:  Oral Oral Oral  SpO2:  97% 98% 96%  Weight:   86.7 kg   Height:        Intake/Output Summary (Last 24 hours) at 10/22/2022 0839 Last data filed at 10/22/2022 0427 Gross per 24 hour  Intake 120 ml  Output 2450 ml  Net -2330 ml   Filed Weights   10/20/22 0417 10/21/22 0437 10/22/22 0424  Weight: 89.8 kg 89.5 kg 86.7 kg    Examination:  General exam: Appears calm but  uncomfortable  Respiratory system: Clear to auscultation. Respiratory effort normal. Cardiovascular system: irregularly irregular. No rubs, gallops or clicks.  Gastrointestinal system: Abdomen is nondistended, soft and nontender. Normal bowel sounds heard. Central nervous system: Alert and oriented. Moves all extremities Psychiatry: Judgement and insight appear normal. Flat mood and affect    Data Reviewed: I have personally reviewed following labs and imaging studies  CBC: Recent Labs  Lab 10/17/22 1050 10/18/22 0254 10/19/22 0320 10/20/22 0418 10/21/22 0500 10/22/22 0346  WBC 2.0* 1.8* 2.0* 1.8* 2.0* 2.2*  NEUTROABS 0.8*  --  0.9* 0.7* 0.8* 0.7*  HGB 11.3* 9.8* 9.9* 9.9* 10.1* 10.1*  HCT 36.4* 30.8* 31.0* 31.1* 31.9* 31.5*  MCV 97.6 95.1 94.5 95.1 94.7 95.2  PLT 93* 74* 69* 70* 86* 96*   Basic Metabolic Panel: Recent Labs  Lab 10/18/22 0254 10/19/22 0320 10/20/22 0418 10/21/22  0500 10/22/22 0346  NA 134* 135 135 137 139  K 3.4* 4.1 3.6 3.7 3.7  CL 105 107 106 108 108  CO2 24 23 24 24 25   GLUCOSE 96 118* 92 114* 89  BUN 27* 22 29* 28* 30*  CREATININE 0.62 0.47* 0.50* 0.61 0.54*  CALCIUM 7.3* 7.4* 7.2* 7.3* 7.6*   GFR: Estimated Creatinine Clearance: 72.3 mL/min (A) (by C-G formula based on SCr of 0.54 mg/dL (L)). Liver Function Tests: Recent Labs  Lab 10/17/22 1352  AST 16  ALT 12  ALKPHOS 17*  BILITOT 0.7  PROT 6.0*  ALBUMIN 2.3*   No results for input(s): "LIPASE", "AMYLASE" in the  last 168 hours. No results for input(s): "AMMONIA" in the last 168 hours. Coagulation Profile: Recent Labs  Lab 10/17/22 1352  INR 1.5*   Cardiac Enzymes: No results for input(s): "CKTOTAL", "CKMB", "CKMBINDEX", "TROPONINI" in the last 168 hours. BNP (last 3 results) No results for input(s): "PROBNP" in the last 8760 hours. HbA1C: No results for input(s): "HGBA1C" in the last 72 hours. CBG: No results for input(s): "GLUCAP" in the last 168 hours. Lipid Profile: No results for input(s): "CHOL", "HDL", "LDLCALC", "TRIG", "CHOLHDL", "LDLDIRECT" in the last 72 hours. Thyroid Function Tests: No results for input(s): "TSH", "T4TOTAL", "FREET4", "T3FREE", "THYROIDAB" in the last 72 hours. Anemia Panel: No results for input(s): "VITAMINB12", "FOLATE", "FERRITIN", "TIBC", "IRON", "RETICCTPCT" in the last 72 hours. Sepsis Labs: Recent Labs  Lab 10/17/22 1050 10/17/22 1352  LATICACIDVEN 2.9* 2.6*    Recent Results (from the past 240 hour(s))  Blood Culture (routine x 2)     Status: Abnormal   Collection Time: 10/17/22 10:50 AM   Specimen: BLOOD  Result Value Ref Range Status   Specimen Description   Final    BLOOD BLOOD LEFT ARM Performed at [facility], [address], [place], [place] [address]    Special Requests   Final    BOTTLES DRAWN AEROBIC AND ANAEROBIC Blood Culture adequate volume Performed at [facility], [address] [place], [place] [address]    Culture  Setup Time   Final    Organism ID to follow IN BOTH AEROBIC AND ANAEROBIC BOTTLES GRAM NEGATIVE COCCI CRITICAL RESULT CALLED TO, READ BACK BY AND VERIFIED WITH: Jessup ROBBINS @ 0008 10/18/22 LFD Performed at [facility], [address] [place], [place] [address]    Culture ACINETOBACTER CALCOACETICUS/BAUMANNII COMPLEX (A)  Final   Report Status 10/20/2022 FINAL  Final   Organism ID, Bacteria ACINETOBACTER CALCOACETICUS/BAUMANNII COMPLEX  Final      Susceptibility    Acinetobacter calcoaceticus/baumannii complex - MIC*    CEFTAZIDIME 8 SENSITIVE Sensitive     CIPROFLOXACIN <=0.25 SENSITIVE Sensitive     GENTAMICIN <=1 SENSITIVE Sensitive     IMIPENEM <=0.25 SENSITIVE Sensitive     PIP/TAZO <=4 SENSITIVE Sensitive     TRIMETH/SULFA <=20 SENSITIVE Sensitive     AMPICILLIN/SULBACTAM <=2 SENSITIVE Sensitive     * ACINETOBACTER CALCOACETICUS/BAUMANNII COMPLEX  Blood Culture ID Panel (Reflexed)     Status: None   Collection Time: 10/17/22 10:50 AM  Result Value Ref Range Status   Enterococcus faecalis NOT DETECTED NOT DETECTED Final   Enterococcus Faecium NOT DETECTED NOT DETECTED Final   Listeria monocytogenes NOT DETECTED NOT DETECTED Final   Staphylococcus species NOT DETECTED NOT DETECTED Final   Staphylococcus aureus (BCID) NOT DETECTED NOT DETECTED Final   Staphylococcus epidermidis NOT DETECTED NOT DETECTED Final   Staphylococcus lugdunensis NOT DETECTED  NOT DETECTED Final   Streptococcus species NOT DETECTED NOT DETECTED Final   Streptococcus agalactiae NOT DETECTED NOT DETECTED Final   Streptococcus pneumoniae NOT DETECTED NOT DETECTED Final   Streptococcus pyogenes NOT DETECTED NOT DETECTED Final   A.calcoaceticus-baumannii NOT DETECTED NOT DETECTED Final   Bacteroides fragilis NOT DETECTED NOT DETECTED Final   Enterobacterales NOT DETECTED NOT DETECTED Final   Enterobacter cloacae complex NOT DETECTED NOT DETECTED Final   Escherichia coli NOT DETECTED NOT DETECTED Final   Klebsiella aerogenes NOT DETECTED NOT DETECTED Final   Klebsiella oxytoca NOT DETECTED NOT DETECTED Final   Klebsiella pneumoniae NOT DETECTED NOT DETECTED Final   Proteus species NOT DETECTED NOT DETECTED Final   Salmonella species NOT DETECTED NOT DETECTED Final   Serratia marcescens NOT DETECTED NOT DETECTED Final   Haemophilus influenzae NOT DETECTED NOT DETECTED Final   Neisseria meningitidis NOT DETECTED NOT DETECTED Final   Pseudomonas aeruginosa NOT DETECTED NOT  DETECTED Final   Stenotrophomonas maltophilia NOT DETECTED NOT DETECTED Final   Candida albicans NOT DETECTED NOT DETECTED Final   Candida auris NOT DETECTED NOT DETECTED Final   Candida glabrata NOT DETECTED NOT DETECTED Final   Candida krusei NOT DETECTED NOT DETECTED Final   Candida parapsilosis NOT DETECTED NOT DETECTED Final   Candida tropicalis NOT DETECTED NOT DETECTED Final   Cryptococcus neoformans/gattii NOT DETECTED NOT DETECTED Final    Comment: Performed at [facility], [address] [place], [place] [address]  Resp Panel by RT-PCR (Flu A&B, Covid) Anterior Nasal Swab     Status: None   Collection Time: 10/17/22 12:05 PM   Specimen: Anterior Nasal Swab  Result Value Ref Range Status   SARS Coronavirus 2 by RT PCR NEGATIVE NEGATIVE Final    Comment: (NOTE) SARS-CoV-2 target nucleic acids are NOT DETECTED.  The SARS-CoV-2 RNA is generally detectable in upper respiratory specimens during the acute phase of infection. The lowest concentration of SARS-CoV-2 viral copies this assay can detect is 138 copies/mL. A negative result does not preclude SARS-Cov-2 infection and should not be used as the sole basis for treatment or other patient management decisions. A negative result may occur with  improper specimen collection/handling, submission of specimen other than nasopharyngeal swab, presence of viral mutation(s) within the areas targeted by this assay, and inadequate number of viral copies(<138 copies/mL). A negative result must be combined with clinical observations, patient history, and epidemiological information. The expected result is Negative.  Fact Sheet for Patients:  BloggerCourse.com  Fact Sheet for Healthcare Providers:  SeriousBroker.it  This test is no t yet approved or cleared by the [place] FDA and  has been authorized for detection and/or diagnosis of SARS-CoV-2 by FDA under an Emergency  Use Authorization (EUA). This EUA will remain  in effect (meaning this test can be used) for the duration of the COVID-19 declaration under Section 564(b)(1) of the Act, 21 U.S.C.section 360bbb-3(b)(1), unless the authorization is terminated  or revoked sooner.       Influenza A by PCR NEGATIVE NEGATIVE Final   Influenza B by PCR NEGATIVE NEGATIVE Final    Comment: (NOTE) The Xpert Xpress SARS-CoV-2/FLU/RSV plus assay is intended as an aid in the diagnosis of influenza from Nasopharyngeal swab specimens and should not be used as a sole basis for treatment. Nasal washings and aspirates are unacceptable for Xpert Xpress SARS-CoV-2/FLU/RSV testing.  Fact Sheet for Patients: BloggerCourse.com  Fact Sheet for Healthcare Providers: SeriousBroker.it  This test is not yet approved or cleared  by the [place] and has been authorized for detection and/or diagnosis of SARS-CoV-2 by FDA under an Emergency Use Authorization (EUA). This EUA will remain in effect (meaning this test can be used) for the duration of the COVID-19 declaration under Section 564(b)(1) of the Act, 21 U.S.C. section 360bbb-3(b)(1), unless the authorization is terminated or revoked.  Performed at [facility], [address] [place], [place] [address]   MRSA Next Gen by PCR, Nasal     Status: None   Collection Time: 10/17/22  6:11 PM   Specimen: Nasal Mucosa; Nasal Swab  Result Value Ref Range Status   MRSA by PCR Next Gen NOT DETECTED NOT DETECTED Final    Comment: (NOTE) The GeneXpert MRSA Assay (FDA approved for NASAL specimens only), is one component of a comprehensive MRSA colonization surveillance program. It is not intended to diagnose MRSA infection nor to guide or monitor treatment for MRSA infections. Test performance is not FDA approved in patients less than 34 years old. Performed at [facility], [address]  [place], [place] [address]   Blood Culture (routine x 2)     Status: None (Preliminary result)   Collection Time: 10/18/22  2:54 AM   Specimen: BLOOD  Result Value Ref Range Status   Specimen Description BLOOD BLOOD RIGHT HAND  Final   Special Requests   Final    BOTTLES DRAWN AEROBIC AND ANAEROBIC Blood Culture adequate volume   Culture   Final    NO GROWTH 4 DAYS Performed at [facility], [address], [place], [place] [address]    Report Status PENDING  Incomplete  Urine Culture     Status: Abnormal   Collection Time: 10/18/22  9:56 AM   Specimen: Urine, Random  Result Value Ref Range Status   Specimen Description   Final    URINE, RANDOM Performed at [facility], [address], [place], [place] [address]    Special Requests   Final    NONE Reflexed from [id number] Performed at [facility], [address] [place], [place] [address]    Culture MULTIPLE SPECIES PRESENT, SUGGEST RECOLLECTION (A)  Final   Report Status 10/22/2022 FINAL  Final  Aerobic/Anaerobic Culture w Gram Stain (surgical/deep wound)     Status: None (Preliminary result)   Collection Time: 10/21/22 12:32 PM   Specimen: Catheter Tip  Result Value Ref Range Status   Specimen Description   Final    CATH TIP Performed at [facility], [address], [place], [place] [address]    Special Requests   Final    NONE Performed at [facility], [address] [place], [place] [address]    Gram Stain NO WBC SEEN NO ORGANISMS SEEN   Final   Culture   Final    NO GROWTH < 12 HOURS Performed at [facility], [address], [place], [place] [address]    Report Status PENDING  Incomplete         Radiology Studies: IR REMOVAL TUN ACCESS W/ PORT W/O FL MOD SED  Result Date: 10/21/2022 INDICATION: Bacteremia EXAM: REMOVAL RIGHT IJ VEIN PORT-A-CATH MEDICATIONS: None ANESTHESIA/SEDATION: Moderate (conscious) sedation was employed during this procedure. A  total of Versed 0.5 mg and Fentanyl 25 mcg was administered intravenously by the radiology nurse. Total intra-service moderate Sedation Time: 11 minutes. The patient's level of consciousness and vital signs were monitored continuously by radiology nursing throughout the procedure under my direct  supervision. FLUOROSCOPY: None COMPLICATIONS: None immediate. PROCEDURE: Informed written consent was obtained from the patient after a thorough discussion of the procedural risks, benefits and alternatives. All questions were addressed. Maximal Sterile Barrier Technique was utilized including caps, mask, sterile gowns, sterile gloves, sterile drape, hand hygiene and skin antiseptic. A timeout was performed prior to the initiation of the procedure. Right anterior upper chest prepped and draped in the usual sterile fashion. Following local lidocaine administration, an incision was made at the superior margin of the port pocket. The pocket was dissected, and the port and catheter were removed in their entirety. The incision was closed with deep and superficial absorbable suture and sealed with Dermabond. The catheter tip was sent for Gram stain and culture. IMPRESSION: Successful right IJ vein Port-A-Cath explant. Electronically Signed   By: Acquanetta Belling M.D.   On: 10/21/2022 15:50        Scheduled Meds:  dexamethasone  2 mg Oral Daily   Followed by   Melene Muller ON 10/24/2022] dexamethasone  1 mg Oral Daily   furosemide  20 mg Intravenous BID   gabapentin  400 mg Oral BID   influenza vaccine adjuvanted  0.5 mL Intramuscular Tomorrow-1000   metoprolol tartrate  12.5 mg Oral BID   montelukast  10 mg Oral QHS   tamsulosin  0.4 mg Oral QPC supper   traZODone  300 mg Oral QHS   Continuous Infusions:  ampicillin-sulbactam (UNASYN) IV 3 g (10/22/22 0542)     LOS: 5 days      Charise Killian, MD [facility] Pager [phone]  If 7PM-7AM, please contact night-coverage [email or web address] 10/22/2022, 8:39  AM

## 2022-10-22 NOTE — Plan of Care (Signed)

## 2022-10-23 ENCOUNTER — Inpatient Hospital Stay: Payer: Medicare Other

## 2022-10-23 ENCOUNTER — Encounter (HOSPITAL_COMMUNITY): Payer: Self-pay | Admitting: Oncology

## 2022-10-23 DIAGNOSIS — L03113 Cellulitis of right upper limb: Secondary | ICD-10-CM | POA: Diagnosis not present

## 2022-10-23 DIAGNOSIS — R7881 Bacteremia: Secondary | ICD-10-CM | POA: Diagnosis not present

## 2022-10-23 LAB — BASIC METABOLIC PANEL
Anion gap: 4 — ABNORMAL LOW (ref 5–15)
BUN: 30 mg/dL — ABNORMAL HIGH (ref 8–23)
CO2: 27 mmol/L (ref 22–32)
Calcium: 7.4 mg/dL — ABNORMAL LOW (ref 8.9–10.3)
Chloride: 106 mmol/L (ref 98–111)
Creatinine, Ser: 0.46 mg/dL — ABNORMAL LOW (ref 0.61–1.24)
GFR, Estimated: >60 mL/min (ref >60)
Glucose, Bld: 99 mg/dL (ref 70–99)
Potassium: 3.4 mmol/L — ABNORMAL LOW (ref 3.5–5.1)
Sodium: 137 mmol/L (ref 135–145)

## 2022-10-23 LAB — CBC
HCT: 32.5 % — ABNORMAL LOW (ref 39.0–52.0)
Hemoglobin: 10.2 g/dL — ABNORMAL LOW (ref 13.0–17.0)
MCH: 30.4 pg (ref 26.0–34.0)
MCHC: 31.4 g/dL (ref 30.0–36.0)
MCV: 97 fL (ref 80.0–100.0)
Platelets: 114 10*3/uL — ABNORMAL LOW (ref 150–400)
RBC: 3.35 MIL/uL — ABNORMAL LOW (ref 4.22–5.81)
RDW: 17 % — ABNORMAL HIGH (ref 11.5–15.5)
WBC: 2.4 10*3/uL — ABNORMAL LOW (ref 4.0–10.5)
nRBC: 0 % (ref 0.0–0.2)

## 2022-10-23 LAB — CULTURE, BLOOD (ROUTINE X 2)
Culture: NO GROWTH
Special Requests: ADEQUATE

## 2022-10-23 MED ORDER — POTASSIUM CHLORIDE CRYS ER 20 MEQ PO TBCR
20.0000 meq | EXTENDED_RELEASE_TABLET | Freq: Once | ORAL | Status: AC
  Administered 2022-10-23: 20 meq via ORAL
  Filled 2022-10-23: qty 1

## 2022-10-23 NOTE — Plan of Care (Signed)

## 2022-10-23 NOTE — Care Management Important Message (Signed)
Important Message  Patient Details  Name: Dean [name]. MRN: [id number] Date of Birth: 1935-05-12   Medicare Important Message Given:  Yes     Johnell Comings 10/23/2022, 1:52 PM

## 2022-10-23 NOTE — TOC Progression Note (Addendum)
Transition of Care (TOC) - Progression Note    Patient Details  Name: Dean [name]. MRN: [id number] Date of Birth: Feb 03, 1936  Transition of Care [facility]) CM/SW Contact  Darolyn Rua, [place] Phone Number: 10/23/2022, 11:32 AM  Clinical Narrative:     Update 12:14: [facility], pending results of xray to identify if patient to discharge today.    CSW spoke with patient and wife at bedside, they report that per MD patient may be getting an Xray today on his knee.   Informed of 3 bed offers [facility]. They report [name] too far, they will review [facility] and text/call CSW with decision.   CSW spoke with MD confirmed plan for xray today on knee.     Barriers to Discharge: Continued Medical Work up  Expected Discharge Plan and Services       Living arrangements for the past 2 months: Single Family Home                                       Social Determinants of Health (SDOH) Interventions SDOH Screenings   Food Insecurity: No Food Insecurity (10/18/2022)  Housing: Low Risk  (10/18/2022)  Transportation Needs: No Transportation Needs (10/18/2022)  Utilities: Not At Risk (10/18/2022)  Alcohol Screen: Low Risk  (01/09/2022)  Depression (PHQ2-9): Low Risk  (09/16/2022)  Financial Resource Strain: Low Risk  (03/21/2022)  Physical Activity: Inactive (01/09/2022)  Social Connections: Moderately Isolated (01/09/2022)  Stress: Stress Concern Present (01/09/2022)  Tobacco Use: Low Risk  (10/18/2022)    Readmission Risk Interventions    08/20/2022    1:21 PM  Readmission Risk Prevention Plan  Transportation Screening Complete  PCP or Specialist Appt within 3-5 Days Complete  HRI or Home Care Consult Complete  Social Work Consult for Recovery Care Planning/Counseling Complete  Palliative Care Screening Not Applicable  Medication Review Oceanographer) Complete

## 2022-10-23 NOTE — Progress Notes (Signed)
PROGRESS NOTE    Dean [name].  ZOX:[id number] DOB: 1935/05/26 DOA: 10/17/2022 PCP: Donita Brooks, MD   Assessment & Plan:   Principal Problem:   Sepsis [facility]) Active Problems:   Multiple myeloma without remission (HCC)   Sepsis due to cellulitis (HCC)   Leukopenia   Paroxysmal A-fib (HCC)   Bacteremia due to Gram-negative bacteria   Cellulitis of right arm  Assessment and Plan: Sepsis: secondary to right arm cellulitis, UTI & acinetobacter bacteremia. Continue on IV unasyn as per ID. Repeat blood cxs NGTD. Port removed. Abxs at d/c as per ID. Sepesis resolved  Bacteremia: blood cx growing acinetobacter. Repeat blood cxs NGTD. Continue on IV unasyn while inpatient and d/c on cipro x 7 days as per ID    Complicated UTI: continue on IV unasyn and d/c w/ cipro x 7 days as per ID    Right arm cellulitis: continue on IV unasyn and d/c w/ cipro x 7 days as per ID   Right knee & leg pain: etiology unclear, likely secondary to MM. XR right knee & leg pending   Hypokalemia: potassium given.    Acute on chronic acute on chronic diastolic CHF: continue on IV lasix. Monitor I/Os   Chronic a. fib: continue on metoprolol. 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 Previous prolonged hospital stay for repair of rupture by urology    Pancytopenia: likely secondary to multiple myeloma & chemo. No need for transfusions currently   Multiple myeloma: continue on pomalidomide. Continue on steroid taper F/u outpatient w/ onco    Anxiety: severity unknown. Continue on home dose of xanax        DVT prophylaxis: eliquis  Code Status: full  Family Communication: discussed pt's care w/ pt's family at bedside and answered their questions  Disposition Plan: likely d/c to SNF  Level of care: Progressive Status is: Inpatient Remains inpatient appropriate because: waiting on XR of right knee & leg     Consultants:   ID  Procedures:  Antimicrobials: unasyn   Subjective: Pt c/o right knee & leg pain   Objective: Vitals:   10/22/22 2131 10/22/22 2325 10/23/22 0353 10/23/22 0810  BP: 105/81 (!) 135/54 120/67 (!) 140/75  Pulse: 85 88 76 (!) 50  Resp:  18 16 18   Temp:  97.9 F (36.6 C) 98.2 F (36.8 C) 98 F (36.7 C)  TempSrc:  Oral Oral   SpO2:  99% 99% 99%  Weight:   86.7 kg   Height:        Intake/Output Summary (Last 24 hours) at 10/23/2022 0854 Last data filed at 10/23/2022 0545 Gross per 24 hour  Intake 600 ml  Output 2600 ml  Net -2000 ml   Filed Weights   10/21/22 0437 10/22/22 0424 10/23/22 0353  Weight: 89.5 kg 86.7 kg 86.7 kg    Examination:  General exam: appears uncomfortable  Respiratory system: clear breath sounds b/l Cardiovascular system: irregularly irregular.  Gastrointestinal system: abd is soft, NT, obese & hypoactive bowel sounds Central nervous system: Alert & oriented. Moves all extremities  Psychiatry: Judgement and insight at baseline. Flat mood and affect    Data Reviewed: I have personally reviewed following labs and imaging studies  CBC: Recent Labs  Lab 10/17/22 1050 10/18/22 0254 10/19/22 0320 10/20/22 0418 10/21/22 0500 10/22/22 0346 10/23/22 0302  WBC 2.0*   < > 2.0* 1.8* 2.0* 2.2* 2.4*  NEUTROABS 0.8*  --  0.9* 0.7* 0.8* 0.7*  --   HGB 11.3*   < > 9.9* 9.9* 10.1* 10.1* 10.2*  HCT 36.4*   < > 31.0* 31.1* 31.9* 31.5* 32.5*  MCV 97.6   < > 94.5 95.1 94.7 95.2 97.0  PLT 93*   < > 69* 70* 86* 96* 114*   < > = values in this interval not displayed.   Basic Metabolic Panel: Recent Labs  Lab 10/19/22 0320 10/20/22 0418 10/21/22 0500 10/22/22 0346 10/23/22 0302  NA 135 135 137 139 137  K 4.1 3.6 3.7 3.7 3.4*  CL 107 106 108 108 106  CO2 23 24 24 25 27   GLUCOSE 118* 92 114* 89 99  BUN 22 29* 28* 30* 30*  CREATININE 0.47* 0.50* 0.61 0.54* 0.46*  CALCIUM 7.4* 7.2* 7.3* 7.6* 7.4*   GFR: Estimated Creatinine Clearance: 72.3  mL/min (A) (by C-G formula based on SCr of 0.46 mg/dL (L)). Liver Function Tests: Recent Labs  Lab 10/17/22 1352  AST 16  ALT 12  ALKPHOS 17*  BILITOT 0.7  PROT 6.0*  ALBUMIN 2.3*   No results for input(s): "LIPASE", "AMYLASE" in the last 168 hours. No results for input(s): "AMMONIA" in the last 168 hours. Coagulation Profile: Recent Labs  Lab 10/17/22 1352  INR 1.5*   Cardiac Enzymes: No results for input(s): "CKTOTAL", "CKMB", "CKMBINDEX", "TROPONINI" in the last 168 hours. BNP (last 3 results) No results for input(s): "PROBNP" in the last 8760 hours. HbA1C: No results for input(s): "HGBA1C" in the last 72 hours. CBG: No results for input(s): "GLUCAP" in the last 168 hours. Lipid Profile: No results for input(s): "CHOL", "HDL", "LDLCALC", "TRIG", "CHOLHDL", "LDLDIRECT" in the last 72 hours. Thyroid Function Tests: No results for input(s): "TSH", "T4TOTAL", "FREET4", "T3FREE", "THYROIDAB" in the last 72 hours. Anemia Panel: No results for input(s): "VITAMINB12", "FOLATE", "FERRITIN", "TIBC", "IRON", "RETICCTPCT" in the last 72 hours. Sepsis Labs: Recent Labs  Lab 10/17/22 1050 10/17/22 1352  LATICACIDVEN 2.9* 2.6*    Recent Results (from the past 240 hour(s))  Blood Culture (routine x 2)     Status: Abnormal   Collection Time: 10/17/22 10:50 AM   Specimen: BLOOD  Result Value Ref Range Status   Specimen Description   Final    BLOOD BLOOD LEFT ARM Performed at [facility], [address] [place], [place] [address]    Special Requests   Final    BOTTLES DRAWN AEROBIC AND ANAEROBIC Blood Culture adequate volume Performed at [facility], [address] [place], [place] [address]    Culture  Setup Time   Final    Organism ID to follow IN BOTH AEROBIC AND ANAEROBIC BOTTLES GRAM NEGATIVE COCCI CRITICAL RESULT CALLED TO, READ BACK BY AND VERIFIED WITH: Caileb ROBBINS @ 0008 10/18/22 LFD Performed at [facility], [address] [place], [place] [address]    Culture ACINETOBACTER CALCOACETICUS/BAUMANNII COMPLEX (A)  Final   Report Status 10/20/2022 FINAL  Final   Organism ID, Bacteria ACINETOBACTER CALCOACETICUS/BAUMANNII COMPLEX  Final      Susceptibility   Acinetobacter calcoaceticus/baumannii complex - MIC*    CEFTAZIDIME 8 SENSITIVE Sensitive     CIPROFLOXACIN <=0.25 SENSITIVE Sensitive     GENTAMICIN <=1 SENSITIVE Sensitive     IMIPENEM <=0.25 SENSITIVE Sensitive     PIP/TAZO <=4 SENSITIVE Sensitive     TRIMETH/SULFA <=20 SENSITIVE Sensitive     AMPICILLIN/SULBACTAM <=2 SENSITIVE Sensitive     * ACINETOBACTER CALCOACETICUS/BAUMANNII COMPLEX  Blood Culture ID  Panel (Reflexed)     Status: None   Collection Time: 10/17/22 10:50 AM  Result Value Ref Range Status   Enterococcus faecalis NOT DETECTED NOT DETECTED Final   Enterococcus Faecium NOT DETECTED NOT DETECTED Final   Listeria monocytogenes NOT DETECTED NOT DETECTED Final   Staphylococcus species NOT DETECTED NOT DETECTED Final   Staphylococcus aureus (BCID) NOT DETECTED NOT DETECTED Final   Staphylococcus epidermidis NOT DETECTED NOT DETECTED Final   Staphylococcus lugdunensis NOT DETECTED NOT DETECTED Final   Streptococcus species NOT DETECTED NOT DETECTED Final   Streptococcus agalactiae NOT DETECTED NOT DETECTED Final   Streptococcus pneumoniae NOT DETECTED NOT DETECTED Final   Streptococcus pyogenes NOT DETECTED NOT DETECTED Final   A.calcoaceticus-baumannii NOT DETECTED NOT DETECTED Final   Bacteroides fragilis NOT DETECTED NOT DETECTED Final   Enterobacterales NOT DETECTED NOT DETECTED Final   Enterobacter cloacae complex NOT DETECTED NOT DETECTED Final   Escherichia coli NOT DETECTED NOT DETECTED Final   Klebsiella aerogenes NOT DETECTED NOT DETECTED Final   Klebsiella oxytoca NOT DETECTED NOT DETECTED Final   Klebsiella pneumoniae NOT DETECTED NOT DETECTED Final   Proteus species NOT DETECTED NOT DETECTED Final   Salmonella species NOT  DETECTED NOT DETECTED Final   Serratia marcescens NOT DETECTED NOT DETECTED Final   Haemophilus influenzae NOT DETECTED NOT DETECTED Final   Neisseria meningitidis NOT DETECTED NOT DETECTED Final   Pseudomonas aeruginosa NOT DETECTED NOT DETECTED Final   Stenotrophomonas maltophilia NOT DETECTED NOT DETECTED Final   Candida albicans NOT DETECTED NOT DETECTED Final   Candida auris NOT DETECTED NOT DETECTED Final   Candida glabrata NOT DETECTED NOT DETECTED Final   Candida krusei NOT DETECTED NOT DETECTED Final   Candida parapsilosis NOT DETECTED NOT DETECTED Final   Candida tropicalis NOT DETECTED NOT DETECTED Final   Cryptococcus neoformans/gattii NOT DETECTED NOT DETECTED Final    Comment: Performed at [facility], [address] [place], [place] [address]  Resp Panel by RT-PCR (Flu A&B, Covid) Anterior Nasal Swab     Status: None   Collection Time: 10/17/22 12:05 PM   Specimen: Anterior Nasal Swab  Result Value Ref Range Status   SARS Coronavirus 2 by RT PCR NEGATIVE NEGATIVE Final    Comment: (NOTE) SARS-CoV-2 target nucleic acids are NOT DETECTED.  The SARS-CoV-2 RNA is generally detectable in upper respiratory specimens during the acute phase of infection. The lowest concentration of SARS-CoV-2 viral copies this assay can detect is 138 copies/mL. A negative result does not preclude SARS-Cov-2 infection and should not be used as the sole basis for treatment or other patient management decisions. A negative result may occur with  improper specimen collection/handling, submission of specimen other than nasopharyngeal swab, presence of viral mutation(s) within the areas targeted by this assay, and inadequate number of viral copies(<138 copies/mL). A negative result must be combined with clinical observations, patient history, and epidemiological information. The expected result is Negative.  Fact Sheet for Patients:  BloggerCourse.com  Fact  Sheet for Healthcare Providers:  SeriousBroker.it  This test is no t yet approved or cleared by the [place] FDA and  has been authorized for detection and/or diagnosis of SARS-CoV-2 by FDA under an Emergency Use Authorization (EUA). This EUA will remain  in effect (meaning this test can be used) for the duration of the COVID-19 declaration under Section 564(b)(1) of the Act, 21 U.S.C.section 360bbb-3(b)(1), unless the authorization is terminated  or revoked sooner.       Influenza A by  PCR NEGATIVE NEGATIVE Final   Influenza B by PCR NEGATIVE NEGATIVE Final    Comment: (NOTE) The Xpert Xpress SARS-CoV-2/FLU/RSV plus assay is intended as an aid in the diagnosis of influenza from Nasopharyngeal swab specimens and should not be used as a sole basis for treatment. Nasal washings and aspirates are unacceptable for Xpert Xpress SARS-CoV-2/FLU/RSV testing.  Fact Sheet for Patients: BloggerCourse.com  Fact Sheet for Healthcare Providers: SeriousBroker.it  This test is not yet approved or cleared by the [place] FDA and has been authorized for detection and/or diagnosis of SARS-CoV-2 by FDA under an Emergency Use Authorization (EUA). This EUA will remain in effect (meaning this test can be used) for the duration of the COVID-19 declaration under Section 564(b)(1) of the Act, 21 U.S.C. section 360bbb-3(b)(1), unless the authorization is terminated or revoked.  Performed at [facility], [address] [place], [place] [address]   MRSA Next Gen by PCR, Nasal     Status: None   Collection Time: 10/17/22  6:11 PM   Specimen: Nasal Mucosa; Nasal Swab  Result Value Ref Range Status   MRSA by PCR Next Gen NOT DETECTED NOT DETECTED Final    Comment: (NOTE) The GeneXpert MRSA Assay (FDA approved for NASAL specimens only), is one component of a comprehensive MRSA colonization  surveillance program. It is not intended to diagnose MRSA infection nor to guide or monitor treatment for MRSA infections. Test performance is not FDA approved in patients less than 42 years old. Performed at [facility], [address] [place], [place] [address]   Blood Culture (routine x 2)     Status: None   Collection Time: 10/18/22  2:54 AM   Specimen: BLOOD  Result Value Ref Range Status   Specimen Description BLOOD BLOOD RIGHT HAND  Final   Special Requests   Final    BOTTLES DRAWN AEROBIC AND ANAEROBIC Blood Culture adequate volume   Culture   Final    NO GROWTH 5 DAYS Performed at [facility], [address], [place], [place] [address]    Report Status 10/23/2022 FINAL  Final  Urine Culture     Status: Abnormal   Collection Time: 10/18/22  9:56 AM   Specimen: Urine, Random  Result Value Ref Range Status   Specimen Description   Final    URINE, RANDOM Performed at [facility], [address] [place], [place] [address]    Special Requests   Final    NONE Reflexed from [phone] Performed at [facility], [address] [place], [place] [address]    Culture MULTIPLE SPECIES PRESENT, SUGGEST RECOLLECTION (A)  Final   Report Status 10/22/2022 FINAL  Final  Aerobic/Anaerobic Culture w Gram Stain (surgical/deep wound)     Status: None (Preliminary result)   Collection Time: 10/21/22 12:32 PM   Specimen: Catheter Tip  Result Value Ref Range Status   Specimen Description   Final    CATH TIP Performed at [facility], [address], [place], [place] [address]    Special Requests   Final    NONE Performed at [facility], [address] [place], [place] [address]    Gram Stain NO WBC SEEN NO ORGANISMS SEEN   Final   Culture   Final    NO GROWTH 2 DAYS Performed at [facility], [address] [place], [place] [address]    Report Status PENDING  Incomplete  Culture, blood (Routine X 2) w Reflex  to ID Panel     Status: None (Preliminary result)   Collection Time: 10/22/22  1:55 PM   Specimen: BLOOD  Result Value Ref Range Status   Specimen Description BLOOD BLOOD RIGHT HAND  Final   Special Requests   Final    BOTTLES DRAWN AEROBIC AND ANAEROBIC Blood Culture adequate volume   Culture   Final    NO GROWTH < 24 HOURS Performed at [facility], [address], [place], [place] [address]    Report Status PENDING  Incomplete  Culture, blood (Routine X 2) w Reflex to ID Panel     Status: None (Preliminary result)   Collection Time: 10/22/22  1:56 PM   Specimen: BLOOD  Result Value Ref Range Status   Specimen Description BLOOD RAC  Final   Special Requests   Final    BOTTLES DRAWN AEROBIC AND ANAEROBIC Blood Culture adequate volume   Culture   Final    NO GROWTH < 24 HOURS Performed at [facility], [address] [place], [place] [address]    Report Status PENDING  Incomplete         Radiology Studies: IR REMOVAL TUN ACCESS W/ PORT W/O FL MOD SED  Result Date: 10/21/2022 INDICATION: Bacteremia EXAM: REMOVAL RIGHT IJ VEIN PORT-A-CATH MEDICATIONS: None ANESTHESIA/SEDATION: Moderate (conscious) sedation was employed during this procedure. A total of Versed 0.5 mg and Fentanyl 25 mcg was administered intravenously by the radiology nurse. Total intra-service moderate Sedation Time: 11 minutes. The patient's level of consciousness and vital signs were monitored continuously by radiology nursing throughout the procedure under my direct supervision. FLUOROSCOPY: None COMPLICATIONS: None immediate. PROCEDURE: Informed written consent was obtained from the patient after a thorough discussion of the procedural risks, benefits and alternatives. All questions were addressed. Maximal Sterile Barrier Technique was utilized including caps, mask, sterile gowns, sterile gloves, sterile drape, hand hygiene and skin antiseptic. A timeout was performed prior to the initiation of  the procedure. Right anterior upper chest prepped and draped in the usual sterile fashion. Following local lidocaine administration, an incision was made at the superior margin of the port pocket. The pocket was dissected, and the port and catheter were removed in their entirety. The incision was closed with deep and superficial absorbable suture and sealed with Dermabond. The catheter tip was sent for Gram stain and culture. IMPRESSION: Successful right IJ vein Port-A-Cath explant. Electronically Signed   By: Acquanetta Belling M.D.   On: 10/21/2022 15:50        Scheduled Meds:  ALPRAZolam  2 mg Oral QHS   apixaban  5 mg Oral BID   dexamethasone  2 mg Oral Daily   Followed by   Melene Muller ON 10/24/2022] dexamethasone  1 mg Oral Daily   docusate sodium  200 mg Oral BID   furosemide  20 mg Intravenous BID   gabapentin  400 mg Oral BID   influenza vaccine adjuvanted  0.5 mL Intramuscular Tomorrow-1000   metoprolol tartrate  12.5 mg Oral BID   montelukast  10 mg Oral QHS   potassium chloride  20 mEq Oral Once   tamsulosin  0.4 mg Oral QPC supper   traZODone  300 mg Oral QHS   Continuous Infusions:  ampicillin-sulbactam (UNASYN) IV 3 g (10/23/22 0545)     LOS: 6 days      Charise Killian, MD [facility] Pager [phone]  If 7PM-7AM, please contact night-coverage [email or web address] 10/23/2022, 8:54 AM

## 2022-10-23 NOTE — Telephone Encounter (Signed)
Med on hold as patient is currently in the hospital

## 2022-10-24 DIAGNOSIS — G4700 Insomnia, unspecified: Secondary | ICD-10-CM | POA: Diagnosis not present

## 2022-10-24 DIAGNOSIS — E569 Vitamin deficiency, unspecified: Secondary | ICD-10-CM | POA: Diagnosis not present

## 2022-10-24 DIAGNOSIS — I10 Essential (primary) hypertension: Secondary | ICD-10-CM | POA: Diagnosis not present

## 2022-10-24 DIAGNOSIS — A419 Sepsis, unspecified organism: Secondary | ICD-10-CM | POA: Diagnosis not present

## 2022-10-24 DIAGNOSIS — I480 Paroxysmal atrial fibrillation: Secondary | ICD-10-CM | POA: Diagnosis not present

## 2022-10-24 DIAGNOSIS — R6520 Severe sepsis without septic shock: Secondary | ICD-10-CM | POA: Diagnosis not present

## 2022-10-24 DIAGNOSIS — T7840XA Allergy, unspecified, initial encounter: Secondary | ICD-10-CM | POA: Diagnosis not present

## 2022-10-24 DIAGNOSIS — R7881 Bacteremia: Secondary | ICD-10-CM | POA: Diagnosis not present

## 2022-10-24 DIAGNOSIS — D72819 Decreased white blood cell count, unspecified: Secondary | ICD-10-CM | POA: Diagnosis not present

## 2022-10-24 DIAGNOSIS — R2681 Unsteadiness on feet: Secondary | ICD-10-CM | POA: Diagnosis not present

## 2022-10-24 DIAGNOSIS — R609 Edema, unspecified: Secondary | ICD-10-CM | POA: Diagnosis not present

## 2022-10-24 DIAGNOSIS — A4154 Sepsis due to Acinetobacter baumannii: Secondary | ICD-10-CM | POA: Diagnosis not present

## 2022-10-24 DIAGNOSIS — C9000 Multiple myeloma not having achieved remission: Secondary | ICD-10-CM | POA: Diagnosis not present

## 2022-10-24 DIAGNOSIS — G7249 Other inflammatory and immune myopathies, not elsewhere classified: Secondary | ICD-10-CM | POA: Diagnosis not present

## 2022-10-24 DIAGNOSIS — M6259 Muscle wasting and atrophy, not elsewhere classified, multiple sites: Secondary | ICD-10-CM | POA: Diagnosis not present

## 2022-10-24 DIAGNOSIS — L03113 Cellulitis of right upper limb: Secondary | ICD-10-CM | POA: Diagnosis not present

## 2022-10-24 DIAGNOSIS — R339 Retention of urine, unspecified: Secondary | ICD-10-CM | POA: Diagnosis not present

## 2022-10-24 DIAGNOSIS — R059 Cough, unspecified: Secondary | ICD-10-CM | POA: Diagnosis not present

## 2022-10-24 DIAGNOSIS — R52 Pain, unspecified: Secondary | ICD-10-CM | POA: Diagnosis not present

## 2022-10-24 DIAGNOSIS — F411 Generalized anxiety disorder: Secondary | ICD-10-CM | POA: Diagnosis not present

## 2022-10-24 DIAGNOSIS — N179 Acute kidney failure, unspecified: Secondary | ICD-10-CM | POA: Diagnosis not present

## 2022-10-24 LAB — CBC
HCT: 31.6 % — ABNORMAL LOW (ref 39.0–52.0)
Hemoglobin: 10.1 g/dL — ABNORMAL LOW (ref 13.0–17.0)
MCH: 30.7 pg (ref 26.0–34.0)
MCHC: 32 g/dL (ref 30.0–36.0)
MCV: 96 fL (ref 80.0–100.0)
Platelets: 109 10*3/uL — ABNORMAL LOW (ref 150–400)
RBC: 3.29 MIL/uL — ABNORMAL LOW (ref 4.22–5.81)
RDW: 17 % — ABNORMAL HIGH (ref 11.5–15.5)
WBC: 2.3 10*3/uL — ABNORMAL LOW (ref 4.0–10.5)
nRBC: 0 % (ref 0.0–0.2)

## 2022-10-24 LAB — BASIC METABOLIC PANEL
Anion gap: 7 (ref 5–15)
BUN: 29 mg/dL — ABNORMAL HIGH (ref 8–23)
CO2: 26 mmol/L (ref 22–32)
Calcium: 7.5 mg/dL — ABNORMAL LOW (ref 8.9–10.3)
Chloride: 105 mmol/L (ref 98–111)
Creatinine, Ser: 0.47 mg/dL — ABNORMAL LOW (ref 0.61–1.24)
GFR, Estimated: >60 mL/min (ref >60)
Glucose, Bld: 94 mg/dL (ref 70–99)
Potassium: 3.8 mmol/L (ref 3.5–5.1)
Sodium: 138 mmol/L (ref 135–145)

## 2022-10-24 MED ORDER — CIPROFLOXACIN HCL 500 MG PO TABS
500.0000 mg | ORAL_TABLET | Freq: Two times a day (BID) | ORAL | 0 refills | Status: DC
Start: 2022-10-24 — End: 2022-10-31

## 2022-10-24 MED ORDER — OXYCODONE HCL 10 MG PO TABS: 10.0000 mg | ORAL_TABLET | Freq: Four times a day (QID) | ORAL | 0 refills | Status: AC | PRN

## 2022-10-24 MED ORDER — ALPRAZOLAM 2 MG PO TABS: 2.0000 mg | ORAL_TABLET | Freq: Every evening | ORAL | 0 refills | Status: AC | PRN

## 2022-10-24 MED ORDER — DEXAMETHASONE 1 MG PO TABS: 1.0000 mg | ORAL_TABLET | Freq: Every day | ORAL | Status: AC

## 2022-10-24 NOTE — TOC Transition Note (Signed)
Transition of Care [facility]) - CM/SW Discharge Note   Patient Details  Name: Dean [name]. MRN: [id number] Date of Birth: September 19, 1935  Transition of Care [facility]) CM/SW Contact:  Darolyn Rua, LCSW Phone Number: 10/24/2022, 1:54 PM   Clinical Narrative:     Patient will DC JX:BJYN Anticipated DC date: 10/24/22 Transport WG:NFAOZ  Per MD patient ready for DC to [facility] . RN, patient, patient's family, and facility notified of DC. Discharge Summary sent to facility. RN given number for report   [phone]. DC packet on chart. Ambulance transport requested for patient.  CSW signing off.    Final next level of care: Skilled Nursing Facility Barriers to Discharge: No Barriers Identified   Patient Goals and CMS Choice CMS Medicare.gov Compare Post Acute Care list provided to:: Patient Choice offered to / list presented to : Patient  Discharge Placement                         Discharge Plan and Services Additional resources added to the After Visit Summary for                                       Social Determinants of Health (SDOH) Interventions SDOH Screenings   Food Insecurity: No Food Insecurity (10/18/2022)  Housing: Low Risk  (10/18/2022)  Transportation Needs: No Transportation Needs (10/18/2022)  Utilities: Not At Risk (10/18/2022)  Alcohol Screen: Low Risk  (01/09/2022)  Depression (PHQ2-9): Low Risk  (09/16/2022)  Financial Resource Strain: Low Risk  (03/21/2022)  Physical Activity: Inactive (01/09/2022)  Social Connections: Moderately Isolated (01/09/2022)  Stress: Stress Concern Present (01/09/2022)  Tobacco Use: Low Risk  (10/18/2022)     Readmission Risk Interventions    08/20/2022    1:21 PM  Readmission Risk Prevention Plan  Transportation Screening Complete  PCP or Specialist Appt within 3-5 Days Complete  HRI or Home Care Consult Complete  Social Work Consult for Recovery Care Planning/Counseling Complete  Palliative Care Screening Not  Applicable  Medication Review Oceanographer) Complete

## 2022-10-24 NOTE — Consult Note (Signed)
[facility]) Accountable Care Organization [facility] Liaison Note  10/24/2022  Dean [name] April 13, 1935 [id number]  Location: [facility] Liaison screened the patient remotely at [facility].  Insurance:  Medicare   Dean Alejandro. is a 87 y.o. male who is a Primary Care Patient of Pickard, Priscille Heidelberg, MD [facility]. The patient was screened for readmission hospitalization with noted extreme risk score for unplanned readmission risk with 1 IP in 6 months.  The patient was assessed for potential [facility]) Care Management service needs for post hospital transition for care coordination. Review of patient's electronic medical record reveals patient was admitted for Sepsis. Pt will discharge to SNF level of care for ongoing STR. Facility will continue to address pt's needs post discharge. Liaison will collaborate with the PAC-RN concerning pt's discharge disposition.  [facility] Care Management/Population Health does not replace or interfere with any arrangements made by the Inpatient Transition of Care team.   For questions contact:   Elliot Cousin, RN, [facility] Liaison [place]   Population Health Office Hours MTWF  8:00 am-6:00 pm [phone] mobile [phone] [Office toll free line] Office Hours are M-F 8:30 - 5 pm [name]

## 2022-10-24 NOTE — Plan of Care (Signed)
Problem: Education: Goal: Knowledge of General Education information will improve Description: Including pain rating scale, medication(s)/side effects and non-pharmacologic comfort measures Outcome: Progressing   Problem: Clinical Measurements: Goal: Ability to maintain clinical measurements within normal limits will improve Outcome: Progressing Goal: Diagnostic test results will improve Outcome: Progressing

## 2022-10-24 NOTE — Discharge Summary (Addendum)
Physician Discharge Summary  Dean [name]. ZOX:[id number] DOB: Jan 19, 1936 DOA: 10/17/2022  PCP: Donita Brooks, MD  Admit date: 10/17/2022 Discharge date: 10/24/2022  Admitted From: home  Disposition:  SNF  Recommendations for Outpatient Follow-up:  Follow up with PCP in 1-2 weeks F/u w/ onco, Dr. Orlie Dakin, in 2-3 weeks  Home Health: no  Equipment/Devices:  Discharge Condition: stable CODE STATUS: full  Diet recommendation: Regular   Brief/Interim Summary: HPI was taken from Dr. Irving Burton: Dean [name]. is a 87 y.o. male with medical history significant of MM on pomalidomide, BPH with recurrent UTIs, chronic HFpEF, PAF on Eliquis, mild/moderate pulmonary hypertension, anxiety/depression, brought in by family member for evaluation of generalized weakness, right-sided arm rash and swelling and pain.   Symptoms started 2 to 3 days ago, patient gradually developed generalized weakness and family also reported the patient's urine smells strongly which concerned him about recurrent UTI.  Patient also reported subjective fever and night and chills.  Yesterday, patient sustained a skin abrasion on the right elbow and overnight she developed redness pain and swelling of right elbow and right forearm.  And this morning patient could not even standing on his feet due to severe weakness and tended to fall family reported the patient mentation appears to be at baseline and no confusion.   ED Course: Temperature 99.1, heart rate 110s, nonhypoxic blood pressure 100/60.  Imaging studies CT head negative for acute findings, chest x-ray showed cardiomegaly and increased pulmonary congestion, right arm x-ray showed no foreign bodies and no osseous abnormalities but chronic myelomatous lesions.  Blood work showed WBC 2.0, hemoglobin 11.3, creatinine 0.6, K3.8.   Code sepsis called and patient was given vancomycin and ceftriaxone in the ED.  Discharge Diagnoses:  Principal Problem:   Sepsis  (HCC) Active Problems:   Multiple myeloma without remission (HCC)   Sepsis due to cellulitis (HCC)   Leukopenia   Paroxysmal A-fib (HCC)   Bacteremia due to Gram-negative bacteria   Cellulitis of right arm  Sepsis: secondary to right arm cellulitis, UTI & acinetobacter bacteremia. Continue on IV unasyn while inpatient & d/c w/ cipro x 7 days as per ID. Repeat blood cxs NGTD. Port removed. Sepesis resolved   Bacteremia: blood cx growing acinetobacter. Repeat blood cxs NGTD. Continue on IV unasyn while inpatient and d/c on cipro x 7 days as per ID    Complicated UTI: continue on IV unasyn while inpatient and d/c w/ cipro x 7 days as per ID    Right arm cellulitis: continue on IV unasyn while inpatient and d/c w/ cipro x 7 days as per ID    Right knee & leg pain: etiology unclear, likely secondary to MM. XR right knee & leg show lytic lesions secondary to multiple myeloma    Hypokalemia: WNL today    Acute on chronic acute on chronic diastolic CHF: continue on lasix. Monitor I/Os    Chronic a. fib: continue on metoprolol. 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 Previous prolonged hospital stay for repair of rupture by urology    Pancytopenia: likely secondary to multiple myeloma & chemo. No need for transfusions currently    Multiple myeloma: continue on pomalidomide. Continue on steroid taper. F/u outpatient w/ onco    Anxiety: severity unknown. Continue on home dose of xanax   Discharge Instructions  Discharge Instructions     Diet general   Complete by: As directed  Discharge instructions   Complete by: As directed    F/u w/ PCP in 1-2 week. F/u w/ onco, Dr. Orlie Dakin, in 2-3 weeks   Increase activity slowly   Complete by: As directed    No wound care   Complete by: As directed       Allergies as of 10/24/2022       Reactions   Codeine Other (See Comments)   Headache   Morphine And Codeine  Other (See Comments)   Makes him feel weird   Revlimid [lenalidomide] Other (See Comments)   "Causes me to become weak"        Medication List     STOP taking these medications    fluticasone 50 MCG/ACT nasal spray Commonly known as: FLONASE       TAKE these medications    acetaminophen 500 MG tablet Commonly known as: TYLENOL Take 500-1,000 mg by mouth every 6 (six) hours as needed for mild pain or moderate pain.   alprazolam 2 MG tablet Commonly known as: XANAX Take 1 tablet (2 mg total) by mouth at bedtime as needed for up to 3 days for anxiety or sleep. What changed:  medication strength See the new instructions.   CALCIUM 500 PO Take 750 mg by mouth daily.   ciprofloxacin 500 MG tablet Commonly known as: Cipro Take 1 tablet (500 mg total) by mouth 2 (two) times daily for 7 days.   cyanocobalamin 500 MCG tablet Commonly known as: VITAMIN B12 Take 1 tablet (500 mcg total) by mouth daily.   dexamethasone 1 MG tablet Commonly known as: DECADRON Take 1 tablet (1 mg total) by mouth daily for 4 days. Start taking on: October 25, 2022 What changed:  medication strength how much to take when to take this   Eliquis 5 MG Tabs tablet Generic drug: apixaban TAKE ONE TABLET BY MOUTH TWICE DAILY   ferrous sulfate 325 (65 FE) MG tablet Take 325 mg by mouth daily.   folic acid 1 MG tablet Commonly known as: FOLVITE Take 1 tablet (1 mg total) by mouth daily.   furosemide 20 MG tablet Commonly known as: LASIX Take 1 tablet (20 mg total) by mouth daily as needed. What changed: when to take this   gabapentin 400 MG capsule Commonly known as: NEURONTIN Take 1 capsule (400 mg total) by mouth 3 (three) times daily. What changed: when to take this   guaiFENesin-dextromethorphan 100-10 MG/5ML syrup Commonly known as: ROBITUSSIN DM Take 5 mLs by mouth every 4 (four) hours as needed for cough (chest congestion).   lidocaine-prilocaine cream Commonly known as:  EMLA Apply small amount over port one (1) hour prior to appointment.   Melatonin 10 MG Tabs Take 10 mg by mouth at bedtime.   metoprolol tartrate 25 MG tablet Commonly known as: LOPRESSOR Take 0.5 tablets (12.5 mg total) by mouth 2 (two) times daily.   montelukast 10 MG tablet Commonly known as: SINGULAIR Take 1 tablet (10 mg total) by mouth at bedtime.   Oxycodone HCl 10 MG Tabs Take 1 tablet (10 mg total) by mouth every 6 (six) hours as needed for up to 3 days. What changed:  when to take this additional instructions   pomalidomide 3 MG capsule Commonly known as: POMALYST Take 1 capsule (3 mg total) by mouth daily. Take for 21 days, then hold for 7 days. Repeat every 28 days.   tamsulosin 0.4 MG Caps capsule Commonly known as: FLOMAX TAKE ONE CAPSULE BY MOUTH EVERY NIGHT  ATBEDTIME   traZODone 100 MG tablet Commonly known as: DESYREL TAKE THREE TABLETS BY MOUTH EVERY NIGHT AT BEDTIME What changed: See the new instructions.   TURMERIC PO Take 1 tablet by mouth 2 (two) times daily.        Allergies  Allergen Reactions   Codeine Other (See Comments)    Headache   Morphine And Codeine Other (See Comments)    Makes him feel weird   Revlimid [Lenalidomide] Other (See Comments)    "Causes me to become weak"    Consultations:    Procedures/Studies: DG Tibia/Fibula Right  Result Date: 10/23/2022 CLINICAL DATA:  Right leg and knee pain, history of multiple myeloma EXAM: RIGHT KNEE - COMPLETE 4+ VIEW; RIGHT TIBIA AND FIBULA - 2 VIEW COMPARISON:  None Available. FINDINGS: Right knee: Frontal, bilateral oblique, lateral views of the right knee are obtained. No acute fracture, subluxation, or dislocation. There is mild 3 compartmental osteoarthritis. No joint effusion. Mild subcutaneous edema. Right tibia/fibula: Frontal and lateral views are obtained. There are no acute fractures. There are well-circumscribed lytic lesions within the proximal left tibia, largest in the  posterior cortex of the proximal diaphysis measuring up to 1.5 cm. Similar lesion is seen within the posterior cortex of the proximal fibular diaphysis measuring up to 1.4 cm. Findings are consistent with known history of multiple myeloma. There are no acute displaced fractures. Mild osteoarthritis of the right knee and ankle. Soft tissues are unremarkable. IMPRESSION: 1. Circumscribed lytic lesions within the proximal tibia and fibula, compatible with known history of multiple lumbar. 2. Mild right knee and right ankle osteoarthritis. 3. No acute bony abnormality. Electronically Signed   By: Sharlet Salina M.D.   On: 10/23/2022 16:24   DG Knee Complete 4 Views Right  Result Date: 10/23/2022 CLINICAL DATA:  Right leg and knee pain, history of multiple myeloma EXAM: RIGHT KNEE - COMPLETE 4+ VIEW; RIGHT TIBIA AND FIBULA - 2 VIEW COMPARISON:  None Available. FINDINGS: Right knee: Frontal, bilateral oblique, lateral views of the right knee are obtained. No acute fracture, subluxation, or dislocation. There is mild 3 compartmental osteoarthritis. No joint effusion. Mild subcutaneous edema. Right tibia/fibula: Frontal and lateral views are obtained. There are no acute fractures. There are well-circumscribed lytic lesions within the proximal left tibia, largest in the posterior cortex of the proximal diaphysis measuring up to 1.5 cm. Similar lesion is seen within the posterior cortex of the proximal fibular diaphysis measuring up to 1.4 cm. Findings are consistent with known history of multiple myeloma. There are no acute displaced fractures. Mild osteoarthritis of the right knee and ankle. Soft tissues are unremarkable. IMPRESSION: 1. Circumscribed lytic lesions within the proximal tibia and fibula, compatible with known history of multiple lumbar. 2. Mild right knee and right ankle osteoarthritis. 3. No acute bony abnormality. Electronically Signed   By: Sharlet Salina M.D.   On: 10/23/2022 16:24   IR REMOVAL TUN  ACCESS W/ PORT W/O FL MOD SED  Result Date: 10/21/2022 INDICATION: Bacteremia EXAM: REMOVAL RIGHT IJ VEIN PORT-A-CATH MEDICATIONS: None ANESTHESIA/SEDATION: Moderate (conscious) sedation was employed during this procedure. A total of Versed 0.5 mg and Fentanyl 25 mcg was administered intravenously by the radiology nurse. Total intra-service moderate Sedation Time: 11 minutes. The patient's level of consciousness and vital signs were monitored continuously by radiology nursing throughout the procedure under my direct supervision. FLUOROSCOPY: None COMPLICATIONS: None immediate. PROCEDURE: Informed written consent was obtained from the patient after a thorough discussion of the procedural risks, benefits  and alternatives. All questions were addressed. Maximal Sterile Barrier Technique was utilized including caps, mask, sterile gowns, sterile gloves, sterile drape, hand hygiene and skin antiseptic. A timeout was performed prior to the initiation of the procedure. Right anterior upper chest prepped and draped in the usual sterile fashion. Following local lidocaine administration, an incision was made at the superior margin of the port pocket. The pocket was dissected, and the port and catheter were removed in their entirety. The incision was closed with deep and superficial absorbable suture and sealed with Dermabond. The catheter tip was sent for Gram stain and culture. IMPRESSION: Successful right IJ vein Port-A-Cath explant. Electronically Signed   By: Acquanetta Belling M.D.   On: 10/21/2022 15:50   DG Chest 1 View  Result Date: 10/18/2022 CLINICAL DATA:  CHF. EXAM: CHEST  1 VIEW COMPARISON:  10/17/2022 FINDINGS: The cardio pericardial silhouette is enlarged. Vascular congestion with diffuse interstitial opacity suggesting edema. There is left base collapse/consolidation with small bilateral pleural effusions. Right Port-A-Cath again noted. Telemetry leads overlie the chest. IMPRESSION: 1. Vascular congestion with  diffuse interstitial opacity suggesting edema. 2. Left base collapse/consolidation with small bilateral pleural effusions. Electronically Signed   By: Kennith Center M.D.   On: 10/18/2022 07:07   DG Forearm Right  Result Date: 10/17/2022 CLINICAL DATA:  Sepsis.  Laceration.  History of myeloma. EXAM: RIGHT FOREARM - 2 VIEW COMPARISON:  None Available. FINDINGS: No acute fracture or dislocation. Preserved adjacent joint spaces. Vascular calcifications. There are subcortical lucencies along the midshaft of the radius and ulna consistent with the patient's history of multiple myeloma. IMPRESSION: No acute osseous abnormality. Presumed myelomatous lesions involving the midshaft of the radius and ulna. Electronically Signed   By: Karen Kays M.D.   On: 10/17/2022 13:14   DG Chest Port 1 View  Result Date: 10/17/2022 CLINICAL DATA:  Questionable sepsis EXAM: PORTABLE CHEST 1 VIEW COMPARISON:  X-ray 08/23/2022 FINDINGS: Enlarged heart. Tortuous and ectatic aorta with stable widening of the mediastinum. Small pleural effusions with some adjacent opacity, left-greater-than-right. Increasing on the left compared to previous. Is also increasing vascular congestion and interstitial thickening. Old left-sided rib fractures. Osteopenia. Overlapping cardiac leads. Stable right IJ chest port with tip at the central SVC. IMPRESSION: Chest port. Enlarged heart with increasing vascular congestion and some interstitial changes. Increasing left retrocardiac opacity.  Persistent small effusions. Recommend follow-up Electronically Signed   By: Karen Kays M.D.   On: 10/17/2022 13:13   CT Head Wo Contrast  Result Date: 10/17/2022 CLINICAL DATA:  Provided history: Facial trauma, blunt. Head trauma. Multiple recent falls (on anticoagulation). Weakness. Additional history obtained from electronic MEDICAL RECORD NUMBERHistory of multiple myeloma. EXAM: CT HEAD WITHOUT CONTRAST TECHNIQUE: Contiguous axial images were obtained from the  base of the skull through the vertex without intravenous contrast. RADIATION DOSE REDUCTION: This exam was performed according to the departmental dose-optimization program which includes automated exposure control, adjustment of the mA and/or kV according to patient size and/or use of iterative reconstruction technique. COMPARISON:  Brain MRI 09/06/2018.  CT angiogram head 11/29/2012. FINDINGS: Brain: Generalized cerebral atrophy. Patchy and ill-defined hypoattenuation within the cerebral white matter, nonspecific but compatible with mild chronic small vessel ischemic disease. There is no acute intracranial hemorrhage. No demarcated cortical infarct. No extra-axial fluid collection. No evidence of an intracranial mass. No midline shift. Vascular: No hyperdense vessel.  Atherosclerotic calcifications. Skull: No calvarial fracture. Known previously treated lesion within the left occipital calvarium. Fairly numerous lucent lesions scattered elsewhere  within the bilateral calvarium, which are new from the prior head CT of 11/29/2012. Additionally, there is a new 14 mm expansile/destructive osseous lesion (with soft tissue components) in the anterior aspect of the right zygomatic arch, encroaching upon the lateral aspect of the right maxillary sinus (series 3, image 9). Sinuses/Orbits: No orbital mass or acute orbital finding. Mild mucosal thickening, and small fluid level, within the right sphenoid sinus. Partial opacification of the right sphenoethmoidal recess. IMPRESSION: 1. No evidence of an acute intracranial abnormality. 2. Mild chronic small vessel ischemic changes within the cerebral white matter. 3. Generalized cerebral atrophy. 4. Known previously treated lesion within the left occipital calvarium. 5. Fairly numerous lucent lesions scattered elsewhere within the bilateral calvarium which are new from the prior head CT of 11/29/2012. Additionally, there is a new 14 mm expansile/destructive osseous lesion (with  soft tissue components) in the anterior aspect of the right zygomatic arch, encroaching upon the lateral aspect of the right maxillary sinus. These new lesions likely reflect sequelae of multiple myeloma given the patient's history. 6. Right sphenoid sinusitis. Electronically Signed   By: Jackey Loge D.O.   On: 10/17/2022 13:07   (Echo, Carotid, EGD, Colonoscopy, ERCP)    Subjective: Pt c/o leg pain intermittently    Discharge Exam: Vitals:   10/24/22 0739 10/24/22 1118  BP: 105/69 (!) 101/57  Pulse: 70 79  Resp: 16 18  Temp: 97.6 F (36.4 C) 97.9 F (36.6 C)  SpO2: 98% 99%   Vitals:   10/23/22 2357 10/24/22 0430 10/24/22 0739 10/24/22 1118  BP: 100/61 99/61 105/69 (!) 101/57  Pulse: 82 70 70 79  Resp: 20 20 16 18   Temp: 98.4 F (36.9 C) 98.4 F (36.9 C) 97.6 F (36.4 C) 97.9 F (36.6 C)  TempSrc:      SpO2: 97% 96% 98% 99%  Weight:      Height:        General: Pt is alert, awake, not in acute distress Cardiovascular: S1/S2 +, no rubs, no gallops Respiratory: CTA bilaterally, no wheezing, no rhonchi Abdominal: Soft, NT, ND, bowel sounds + Extremities: no cyanosis    The results of significant diagnostics from this hospitalization (including imaging, microbiology, ancillary and laboratory) are listed below for reference.     Microbiology: Recent Results (from the past 240 hour(s))  Blood Culture (routine x 2)     Status: Abnormal   Collection Time: 10/17/22 10:50 AM   Specimen: BLOOD  Result Value Ref Range Status   Specimen Description   Final    BLOOD BLOOD LEFT ARM Performed at [facility], [address] [place], [place] [address]    Special Requests   Final    BOTTLES DRAWN AEROBIC AND ANAEROBIC Blood Culture adequate volume Performed at [facility], [address] [place], [place] [address]    Culture  Setup Time   Final    Organism ID to follow IN BOTH AEROBIC AND ANAEROBIC BOTTLES GRAM NEGATIVE COCCI CRITICAL RESULT CALLED  TO, READ BACK BY AND VERIFIED WITH: Lonnell ROBBINS @ 0008 10/18/22 LFD Performed at [facility], [address] [place], [place] [address]    Culture ACINETOBACTER CALCOACETICUS/BAUMANNII COMPLEX (A)  Final   Report Status 10/20/2022 FINAL  Final   Organism ID, Bacteria ACINETOBACTER CALCOACETICUS/BAUMANNII COMPLEX  Final      Susceptibility   Acinetobacter calcoaceticus/baumannii complex - MIC*    CEFTAZIDIME 8 SENSITIVE Sensitive     CIPROFLOXACIN <=0.25 SENSITIVE Sensitive  GENTAMICIN <=1 SENSITIVE Sensitive     IMIPENEM <=0.25 SENSITIVE Sensitive     PIP/TAZO <=4 SENSITIVE Sensitive     TRIMETH/SULFA <=20 SENSITIVE Sensitive     AMPICILLIN/SULBACTAM <=2 SENSITIVE Sensitive     * ACINETOBACTER CALCOACETICUS/BAUMANNII COMPLEX  Blood Culture ID Panel (Reflexed)     Status: None   Collection Time: 10/17/22 10:50 AM  Result Value Ref Range Status   Enterococcus faecalis NOT DETECTED NOT DETECTED Final   Enterococcus Faecium NOT DETECTED NOT DETECTED Final   Listeria monocytogenes NOT DETECTED NOT DETECTED Final   Staphylococcus species NOT DETECTED NOT DETECTED Final   Staphylococcus aureus (BCID) NOT DETECTED NOT DETECTED Final   Staphylococcus epidermidis NOT DETECTED NOT DETECTED Final   Staphylococcus lugdunensis NOT DETECTED NOT DETECTED Final   Streptococcus species NOT DETECTED NOT DETECTED Final   Streptococcus agalactiae NOT DETECTED NOT DETECTED Final   Streptococcus pneumoniae NOT DETECTED NOT DETECTED Final   Streptococcus pyogenes NOT DETECTED NOT DETECTED Final   A.calcoaceticus-baumannii NOT DETECTED NOT DETECTED Final   Bacteroides fragilis NOT DETECTED NOT DETECTED Final   Enterobacterales NOT DETECTED NOT DETECTED Final   Enterobacter cloacae complex NOT DETECTED NOT DETECTED Final   Escherichia coli NOT DETECTED NOT DETECTED Final   Klebsiella aerogenes NOT DETECTED NOT DETECTED Final   Klebsiella oxytoca NOT DETECTED NOT DETECTED Final    Klebsiella pneumoniae NOT DETECTED NOT DETECTED Final   Proteus species NOT DETECTED NOT DETECTED Final   Salmonella species NOT DETECTED NOT DETECTED Final   Serratia marcescens NOT DETECTED NOT DETECTED Final   Haemophilus influenzae NOT DETECTED NOT DETECTED Final   Neisseria meningitidis NOT DETECTED NOT DETECTED Final   Pseudomonas aeruginosa NOT DETECTED NOT DETECTED Final   Stenotrophomonas maltophilia NOT DETECTED NOT DETECTED Final   Candida albicans NOT DETECTED NOT DETECTED Final   Candida auris NOT DETECTED NOT DETECTED Final   Candida glabrata NOT DETECTED NOT DETECTED Final   Candida krusei NOT DETECTED NOT DETECTED Final   Candida parapsilosis NOT DETECTED NOT DETECTED Final   Candida tropicalis NOT DETECTED NOT DETECTED Final   Cryptococcus neoformans/gattii NOT DETECTED NOT DETECTED Final    Comment: Performed at [facility], [address] [place], [place] [address]  Resp Panel by RT-PCR (Flu A&B, Covid) Anterior Nasal Swab     Status: None   Collection Time: 10/17/22 12:05 PM   Specimen: Anterior Nasal Swab  Result Value Ref Range Status   SARS Coronavirus 2 by RT PCR NEGATIVE NEGATIVE Final    Comment: (NOTE) SARS-CoV-2 target nucleic acids are NOT DETECTED.  The SARS-CoV-2 RNA is generally detectable in upper respiratory specimens during the acute phase of infection. The lowest concentration of SARS-CoV-2 viral copies this assay can detect is 138 copies/mL. A negative result does not preclude SARS-Cov-2 infection and should not be used as the sole basis for treatment or other patient management decisions. A negative result may occur with  improper specimen collection/handling, submission of specimen other than nasopharyngeal swab, presence of viral mutation(s) within the areas targeted by this assay, and inadequate number of viral copies(<138 copies/mL). A negative result must be combined with clinical observations, patient history, and  epidemiological information. The expected result is Negative.  Fact Sheet for Patients:  BloggerCourse.com  Fact Sheet for Healthcare Providers:  SeriousBroker.it  This test is no t yet approved or cleared by the [place] FDA and  has been authorized for detection and/or diagnosis of SARS-CoV-2 by FDA under an Emergency Use Authorization (  EUA). This EUA will remain  in effect (meaning this test can be used) for the duration of the COVID-19 declaration under Section 564(b)(1) of the Act, 21 U.S.C.section 360bbb-3(b)(1), unless the authorization is terminated  or revoked sooner.       Influenza A by PCR NEGATIVE NEGATIVE Final   Influenza B by PCR NEGATIVE NEGATIVE Final    Comment: (NOTE) The Xpert Xpress SARS-CoV-2/FLU/RSV plus assay is intended as an aid in the diagnosis of influenza from Nasopharyngeal swab specimens and should not be used as a sole basis for treatment. Nasal washings and aspirates are unacceptable for Xpert Xpress SARS-CoV-2/FLU/RSV testing.  Fact Sheet for Patients: BloggerCourse.com  Fact Sheet for Healthcare Providers: SeriousBroker.it  This test is not yet approved or cleared by the [place] FDA and has been authorized for detection and/or diagnosis of SARS-CoV-2 by FDA under an Emergency Use Authorization (EUA). This EUA will remain in effect (meaning this test can be used) for the duration of the COVID-19 declaration under Section 564(b)(1) of the Act, 21 U.S.C. section 360bbb-3(b)(1), unless the authorization is terminated or revoked.  Performed at [facility], [address] [place], [place] [address]   MRSA Next Gen by PCR, Nasal     Status: None   Collection Time: 10/17/22  6:11 PM   Specimen: Nasal Mucosa; Nasal Swab  Result Value Ref Range Status   MRSA by PCR Next Gen NOT DETECTED NOT DETECTED Final    Comment:  (NOTE) The GeneXpert MRSA Assay (FDA approved for NASAL specimens only), is one component of a comprehensive MRSA colonization surveillance program. It is not intended to diagnose MRSA infection nor to guide or monitor treatment for MRSA infections. Test performance is not FDA approved in patients less than 67 years old. Performed at [facility], [address] [place], [place] [address]   Blood Culture (routine x 2)     Status: None   Collection Time: 10/18/22  2:54 AM   Specimen: BLOOD  Result Value Ref Range Status   Specimen Description BLOOD BLOOD RIGHT HAND  Final   Special Requests   Final    BOTTLES DRAWN AEROBIC AND ANAEROBIC Blood Culture adequate volume   Culture   Final    NO GROWTH 5 DAYS Performed at [facility], [address], [place], [place] [address]    Report Status 10/23/2022 FINAL  Final  Urine Culture     Status: Abnormal   Collection Time: 10/18/22  9:56 AM   Specimen: Urine, Random  Result Value Ref Range Status   Specimen Description   Final    URINE, RANDOM Performed at [facility], [address] [place], [place] [address]    Special Requests   Final    NONE Reflexed from [id number] Performed at [facility], [address] [place], [place] [address]    Culture MULTIPLE SPECIES PRESENT, SUGGEST RECOLLECTION (A)  Final   Report Status 10/22/2022 FINAL  Final  Aerobic/Anaerobic Culture w Gram Stain (surgical/deep wound)     Status: None (Preliminary result)   Collection Time: 10/21/22 12:32 PM   Specimen: Catheter Tip  Result Value Ref Range Status   Specimen Description   Final    CATH TIP Performed at [facility], [address] [place], [place] [address]    Special Requests   Final    NONE Performed at [facility], [address] [place], [place] [address]    Gram Stain NO WBC SEEN  NO ORGANISMS SEEN   Final   Culture   Final    NO GROWTH 3 DAYS NO ANAEROBES ISOLATED;  CULTURE IN PROGRESS FOR 5 DAYS Performed at [facility], [address] [place], [place] [address]    Report Status PENDING  Incomplete  Culture, blood (Routine X 2) w Reflex to ID Panel     Status: None (Preliminary result)   Collection Time: 10/22/22  1:55 PM   Specimen: BLOOD  Result Value Ref Range Status   Specimen Description BLOOD BLOOD RIGHT HAND  Final   Special Requests   Final    BOTTLES DRAWN AEROBIC AND ANAEROBIC Blood Culture adequate volume   Culture   Final    NO GROWTH 2 DAYS Performed at [facility], [address] [place], [place] [address]    Report Status PENDING  Incomplete  Culture, blood (Routine X 2) w Reflex to ID Panel     Status: None (Preliminary result)   Collection Time: 10/22/22  1:56 PM   Specimen: BLOOD  Result Value Ref Range Status   Specimen Description BLOOD RAC  Final   Special Requests   Final    BOTTLES DRAWN AEROBIC AND ANAEROBIC Blood Culture adequate volume   Culture   Final    NO GROWTH 2 DAYS Performed at [facility], [address] [place], [place] [address]    Report Status PENDING  Incomplete     Labs: BNP (last 3 results) Recent Labs    02/10/22 1306 02/12/22 0321 10/17/22 1050  BNP 383.0* 299.0* 430.1*   Basic Metabolic Panel: Recent Labs  Lab 10/20/22 0418 10/21/22 0500 10/22/22 0346 10/23/22 0302 10/24/22 0341  NA 135 137 139 137 138  K 3.6 3.7 3.7 3.4* 3.8  CL 106 108 108 106 105  CO2 24 24 25 27 26   GLUCOSE 92 114* 89 99 94  BUN 29* 28* 30* 30* 29*  CREATININE 0.50* 0.61 0.54* 0.46* 0.47*  CALCIUM 7.2* 7.3* 7.6* 7.4* 7.5*   Liver Function Tests: Recent Labs  Lab 10/17/22 1352  AST 16  ALT 12  ALKPHOS 17*  BILITOT 0.7  PROT 6.0*  ALBUMIN 2.3*   No results for input(s): "LIPASE", "AMYLASE" in the last 168 hours. No results for input(s): "AMMONIA" in the last 168 hours. CBC: Recent Labs  Lab 10/19/22 0320 10/20/22 0418 10/21/22 0500 10/22/22 0346  10/23/22 0302 10/24/22 0341  WBC 2.0* 1.8* 2.0* 2.2* 2.4* 2.3*  NEUTROABS 0.9* 0.7* 0.8* 0.7*  --   --   HGB 9.9* 9.9* 10.1* 10.1* 10.2* 10.1*  HCT 31.0* 31.1* 31.9* 31.5* 32.5* 31.6*  MCV 94.5 95.1 94.7 95.2 97.0 96.0  PLT 69* 70* 86* 96* 114* 109*   Cardiac Enzymes: No results for input(s): "CKTOTAL", "CKMB", "CKMBINDEX", "TROPONINI" in the last 168 hours. BNP: Invalid input(s): "POCBNP" CBG: No results for input(s): "GLUCAP" in the last 168 hours. D-Dimer No results for input(s): "DDIMER" in the last 72 hours. Hgb A1c No results for input(s): "HGBA1C" in the last 72 hours. Lipid Profile No results for input(s): "CHOL", "HDL", "LDLCALC", "TRIG", "CHOLHDL", "LDLDIRECT" in the last 72 hours. Thyroid function studies No results for input(s): "TSH", "T4TOTAL", "T3FREE", "THYROIDAB" in the last 72 hours.  Invalid input(s): "FREET3" Anemia work up No results for input(s): "VITAMINB12", "FOLATE", "FERRITIN", "TIBC", "IRON", "RETICCTPCT" in the last 72 hours. Urinalysis    Component Value Date/Time   COLORURINE YELLOW (A) 10/18/2022 0956   APPEARANCEUR CLOUDY (A) 10/18/2022 9147  LABSPEC 1.015 10/18/2022 0956   PHURINE 7.0 10/18/2022 0956   GLUCOSEU NEGATIVE 10/18/2022 0956   HGBUR SMALL (A) 10/18/2022 0956   BILIRUBINUR NEGATIVE 10/18/2022 0956   BILIRUBINUR negative 07/18/2021 0841   KETONESUR 5 (A) 10/18/2022 0956   PROTEINUR 30 (A) 10/18/2022 0956   UROBILINOGEN 0.2 07/18/2021 0841   UROBILINOGEN 0.2 01/02/2014 1103   NITRITE POSITIVE (A) 10/18/2022 0956   LEUKOCYTESUR LARGE (A) 10/18/2022 0956   Sepsis Labs Recent Labs  Lab 10/21/22 0500 10/22/22 0346 10/23/22 0302 10/24/22 0341  WBC 2.0* 2.2* 2.4* 2.3*   Microbiology Recent Results (from the past 240 hour(s))  Blood Culture (routine x 2)     Status: Abnormal   Collection Time: 10/17/22 10:50 AM   Specimen: BLOOD  Result Value Ref Range Status   Specimen Description   Final    BLOOD BLOOD LEFT  ARM Performed at [facility], [address], [place], [place] [address]    Special Requests   Final    BOTTLES DRAWN AEROBIC AND ANAEROBIC Blood Culture adequate volume Performed at [facility], [address] [place], [place] [address]    Culture  Setup Time   Final    Organism ID to follow IN BOTH AEROBIC AND ANAEROBIC BOTTLES GRAM NEGATIVE COCCI CRITICAL RESULT CALLED TO, READ BACK BY AND VERIFIED WITH: Shandon ROBBINS @ 0008 10/18/22 LFD Performed at [facility], [address] [place], [place] [address]    Culture ACINETOBACTER CALCOACETICUS/BAUMANNII COMPLEX (A)  Final   Report Status 10/20/2022 FINAL  Final   Organism ID, Bacteria ACINETOBACTER CALCOACETICUS/BAUMANNII COMPLEX  Final      Susceptibility   Acinetobacter calcoaceticus/baumannii complex - MIC*    CEFTAZIDIME 8 SENSITIVE Sensitive     CIPROFLOXACIN <=0.25 SENSITIVE Sensitive     GENTAMICIN <=1 SENSITIVE Sensitive     IMIPENEM <=0.25 SENSITIVE Sensitive     PIP/TAZO <=4 SENSITIVE Sensitive     TRIMETH/SULFA <=20 SENSITIVE Sensitive     AMPICILLIN/SULBACTAM <=2 SENSITIVE Sensitive     * ACINETOBACTER CALCOACETICUS/BAUMANNII COMPLEX  Blood Culture ID Panel (Reflexed)     Status: None   Collection Time: 10/17/22 10:50 AM  Result Value Ref Range Status   Enterococcus faecalis NOT DETECTED NOT DETECTED Final   Enterococcus Faecium NOT DETECTED NOT DETECTED Final   Listeria monocytogenes NOT DETECTED NOT DETECTED Final   Staphylococcus species NOT DETECTED NOT DETECTED Final   Staphylococcus aureus (BCID) NOT DETECTED NOT DETECTED Final   Staphylococcus epidermidis NOT DETECTED NOT DETECTED Final   Staphylococcus lugdunensis NOT DETECTED NOT DETECTED Final   Streptococcus species NOT DETECTED NOT DETECTED Final   Streptococcus agalactiae NOT DETECTED NOT DETECTED Final   Streptococcus pneumoniae NOT DETECTED NOT DETECTED Final   Streptococcus pyogenes NOT DETECTED NOT DETECTED  Final   A.calcoaceticus-baumannii NOT DETECTED NOT DETECTED Final   Bacteroides fragilis NOT DETECTED NOT DETECTED Final   Enterobacterales NOT DETECTED NOT DETECTED Final   Enterobacter cloacae complex NOT DETECTED NOT DETECTED Final   Escherichia coli NOT DETECTED NOT DETECTED Final   Klebsiella aerogenes NOT DETECTED NOT DETECTED Final   Klebsiella oxytoca NOT DETECTED NOT DETECTED Final   Klebsiella pneumoniae NOT DETECTED NOT DETECTED Final   Proteus species NOT DETECTED NOT DETECTED Final   Salmonella species NOT DETECTED NOT DETECTED Final   Serratia marcescens NOT DETECTED NOT DETECTED Final   Haemophilus influenzae NOT DETECTED NOT DETECTED Final   Neisseria meningitidis NOT DETECTED NOT DETECTED Final   Pseudomonas aeruginosa NOT  DETECTED NOT DETECTED Final   Stenotrophomonas maltophilia NOT DETECTED NOT DETECTED Final   Candida albicans NOT DETECTED NOT DETECTED Final   Candida auris NOT DETECTED NOT DETECTED Final   Candida glabrata NOT DETECTED NOT DETECTED Final   Candida krusei NOT DETECTED NOT DETECTED Final   Candida parapsilosis NOT DETECTED NOT DETECTED Final   Candida tropicalis NOT DETECTED NOT DETECTED Final   Cryptococcus neoformans/gattii NOT DETECTED NOT DETECTED Final    Comment: Performed at [name], [address] [place], [place] [address]  Resp Panel by RT-PCR (Flu A&B, Covid) Anterior Nasal Swab     Status: None   Collection Time: 10/17/22 12:05 PM   Specimen: Anterior Nasal Swab  Result Value Ref Range Status   SARS Coronavirus 2 by RT PCR NEGATIVE NEGATIVE Final    Comment: (NOTE) SARS-CoV-2 target nucleic acids are NOT DETECTED.  The SARS-CoV-2 RNA is generally detectable in upper respiratory specimens during the acute phase of infection. The lowest concentration of SARS-CoV-2 viral copies this assay can detect is 138 copies/mL. A negative result does not preclude SARS-Cov-2 infection and should not be used as the sole basis for  treatment or other patient management decisions. A negative result may occur with  improper specimen collection/handling, submission of specimen other than nasopharyngeal swab, presence of viral mutation(s) within the areas targeted by this assay, and inadequate number of viral copies(<138 copies/mL). A negative result must be combined with clinical observations, patient history, and epidemiological information. The expected result is Negative.  Fact Sheet for Patients:  BloggerCourse.com  Fact Sheet for Healthcare Providers:  SeriousBroker.it  This test is no t yet approved or cleared by the [place] FDA and  has been authorized for detection and/or diagnosis of SARS-CoV-2 by FDA under an Emergency Use Authorization (EUA). This EUA will remain  in effect (meaning this test can be used) for the duration of the COVID-19 declaration under Section 564(b)(1) of the Act, 21 U.S.C.section 360bbb-3(b)(1), unless the authorization is terminated  or revoked sooner.       Influenza A by PCR NEGATIVE NEGATIVE Final   Influenza B by PCR NEGATIVE NEGATIVE Final    Comment: (NOTE) The Xpert Xpress SARS-CoV-2/FLU/RSV plus assay is intended as an aid in the diagnosis of influenza from Nasopharyngeal swab specimens and should not be used as a sole basis for treatment. Nasal washings and aspirates are unacceptable for Xpert Xpress SARS-CoV-2/FLU/RSV testing.  Fact Sheet for Patients: BloggerCourse.com  Fact Sheet for Healthcare Providers: SeriousBroker.it  This test is not yet approved or cleared by the [place] FDA and has been authorized for detection and/or diagnosis of SARS-CoV-2 by FDA under an Emergency Use Authorization (EUA). This EUA will remain in effect (meaning this test can be used) for the duration of the COVID-19 declaration under Section 564(b)(1) of the Act, 21  U.S.C. section 360bbb-3(b)(1), unless the authorization is terminated or revoked.  Performed at [facility], [address] [place], [place] [address]   MRSA Next Gen by PCR, Nasal     Status: None   Collection Time: 10/17/22  6:11 PM   Specimen: Nasal Mucosa; Nasal Swab  Result Value Ref Range Status   MRSA by PCR Next Gen NOT DETECTED NOT DETECTED Final    Comment: (NOTE) The GeneXpert MRSA Assay (FDA approved for NASAL specimens only), is one component of a comprehensive MRSA colonization surveillance program. It is not intended to diagnose MRSA infection nor to guide or monitor treatment for MRSA  infections. Test performance is not FDA approved in patients less than 1 years old. Performed at [facility], [address] [place], [place] [address]   Blood Culture (routine x 2)     Status: None   Collection Time: 10/18/22  2:54 AM   Specimen: BLOOD  Result Value Ref Range Status   Specimen Description BLOOD BLOOD RIGHT HAND  Final   Special Requests   Final    BOTTLES DRAWN AEROBIC AND ANAEROBIC Blood Culture adequate volume   Culture   Final    NO GROWTH 5 DAYS Performed at [facility], [address], [place], [place] [address]    Report Status 10/23/2022 FINAL  Final  Urine Culture     Status: Abnormal   Collection Time: 10/18/22  9:56 AM   Specimen: Urine, Random  Result Value Ref Range Status   Specimen Description   Final    URINE, RANDOM Performed at [facility], [address], [place], [place] [address]    Special Requests   Final    NONE Reflexed from [id number] Performed at [facility], [address] [place], [place] [address]    Culture MULTIPLE SPECIES PRESENT, SUGGEST RECOLLECTION (A)  Final   Report Status 10/22/2022 FINAL  Final  Aerobic/Anaerobic Culture w Gram Stain (surgical/deep wound)     Status: None (Preliminary result)   Collection Time: 10/21/22 12:32 PM   Specimen: Catheter Tip  Result  Value Ref Range Status   Specimen Description   Final    CATH TIP Performed at [facility], [address] [place], [place] [address]    Special Requests   Final    NONE Performed at [facility], [address], [place], [place] [address]    Gram Stain NO WBC SEEN NO ORGANISMS SEEN   Final   Culture   Final    NO GROWTH 3 DAYS NO ANAEROBES ISOLATED; CULTURE IN PROGRESS FOR 5 DAYS Performed at [facility], [address] [place], [place] [address]    Report Status PENDING  Incomplete  Culture, blood (Routine X 2) w Reflex to ID Panel     Status: None (Preliminary result)   Collection Time: 10/22/22  1:55 PM   Specimen: BLOOD  Result Value Ref Range Status   Specimen Description BLOOD BLOOD RIGHT HAND  Final   Special Requests   Final    BOTTLES DRAWN AEROBIC AND ANAEROBIC Blood Culture adequate volume   Culture   Final    NO GROWTH 2 DAYS Performed at [facility], [address], [place], [place] [address]    Report Status PENDING  Incomplete  Culture, blood (Routine X 2) w Reflex to ID Panel     Status: None (Preliminary result)   Collection Time: 10/22/22  1:56 PM   Specimen: BLOOD  Result Value Ref Range Status   Specimen Description BLOOD RAC  Final   Special Requests   Final    BOTTLES DRAWN AEROBIC AND ANAEROBIC Blood Culture adequate volume   Culture   Final    NO GROWTH 2 DAYS Performed at [facility], [address], [place], [place] [address]    Report Status PENDING  Incomplete     Time coordinating discharge: Over 30 minutes  SIGNED:   Charise Killian, MD  [facility] 10/24/2022, 1:48 PM Pager   If 7PM-7AM, please contact night-coverage [email or web address]

## 2022-10-24 NOTE — Progress Notes (Signed)
   10/24/22 1500  Spiritual Encounters  Type of Visit Initial  Care provided to: Pt and family  Referral source Chaplain assessment  Reason for visit Routine spiritual support  OnCall Visit No  Spiritual Framework  Presenting Themes Caregiving needs;Courage hope and growth  Patient Stress Factors Health changes  Family Stress Factors None identified  Interventions  Spiritual Care Interventions Made Established relationship of care and support;Compassionate presence;Reflective listening;Encouragement  Intervention Outcomes  Outcomes Awareness around self/spiritual resourses;Awareness of health;Awareness of support  Spiritual Care Plan  Spiritual Care Issues Still Outstanding No further spiritual care needs at this time (see row info)   While rounding chaplain met with patient and his daughter. Patients daughter shared that the patient will be discharged this evening and will be going to rehab. Patient shared that he has had a problem standing and walking and he hopes that the rehab facility will be able to help him. Patients daughter shared pictures with the chaplain of her grandchildren that she is raising and asked the chaplain to pray for her daughter who is having a difficult time. Chaplain offered words of encouragement and hope. Patient and daughter thanked the chaplain for visiting.

## 2022-10-26 LAB — AEROBIC/ANAEROBIC CULTURE W GRAM STAIN (SURGICAL/DEEP WOUND)
Culture: NO GROWTH
Gram Stain: NONE SEEN

## 2022-10-27 LAB — CULTURE, BLOOD (ROUTINE X 2)
Culture: NO GROWTH
Culture: NO GROWTH
Special Requests: ADEQUATE
Special Requests: ADEQUATE

## 2022-10-28 ENCOUNTER — Encounter: Payer: Self-pay | Admitting: Oncology

## 2022-10-28 ENCOUNTER — Inpatient Hospital Stay: Payer: Medicare Other | Attending: Oncology

## 2022-10-28 ENCOUNTER — Inpatient Hospital Stay: Payer: Medicare Other

## 2022-10-28 ENCOUNTER — Other Ambulatory Visit: Payer: Self-pay | Admitting: Oncology

## 2022-10-28 ENCOUNTER — Inpatient Hospital Stay (HOSPITAL_BASED_OUTPATIENT_CLINIC_OR_DEPARTMENT_OTHER): Payer: Medicare Other | Admitting: Oncology

## 2022-10-28 ENCOUNTER — Inpatient Hospital Stay: Payer: Medicare Other | Admitting: Pharmacist

## 2022-10-28 ENCOUNTER — Telehealth: Payer: Self-pay | Admitting: Family Medicine

## 2022-10-28 VITALS — BP 96/56 | HR 69 | Temp 94.1°F | Resp 18

## 2022-10-28 VITALS — BP 85/71 | HR 77 | Temp 96.8°F | Resp 16 | Ht 68.0 in

## 2022-10-28 DIAGNOSIS — R5383 Other fatigue: Secondary | ICD-10-CM | POA: Diagnosis not present

## 2022-10-28 DIAGNOSIS — R531 Weakness: Secondary | ICD-10-CM | POA: Diagnosis not present

## 2022-10-28 DIAGNOSIS — C9000 Multiple myeloma not having achieved remission: Secondary | ICD-10-CM | POA: Diagnosis not present

## 2022-10-28 LAB — COMPREHENSIVE METABOLIC PANEL
ALT: 12 U/L (ref 0–44)
AST: 15 U/L (ref 15–41)
Albumin: 2.5 g/dL — ABNORMAL LOW (ref 3.5–5.0)
Alkaline Phosphatase: 20 U/L — ABNORMAL LOW (ref 38–126)
Anion gap: 6 (ref 5–15)
BUN: 27 mg/dL — ABNORMAL HIGH (ref 8–23)
CO2: 26 mmol/L (ref 22–32)
Calcium: 8.7 mg/dL — ABNORMAL LOW (ref 8.9–10.3)
Chloride: 105 mmol/L (ref 98–111)
Creatinine, Ser: 0.68 mg/dL (ref 0.61–1.24)
GFR, Estimated: >60 mL/min (ref >60)
Glucose, Bld: 116 mg/dL — ABNORMAL HIGH (ref 70–99)
Potassium: 3.8 mmol/L (ref 3.5–5.1)
Sodium: 137 mmol/L (ref 135–145)
Total Bilirubin: 0.6 mg/dL (ref 0.3–1.2)
Total Protein: 6.5 g/dL (ref 6.5–8.1)

## 2022-10-28 LAB — CBC WITH DIFFERENTIAL/PLATELET
Abs Immature Granulocytes: 0.04 10*3/uL (ref 0.00–0.07)
Basophils Absolute: 0 10*3/uL (ref 0.0–0.1)
Basophils Relative: 1 %
Eosinophils Absolute: 0 10*3/uL (ref 0.0–0.5)
Eosinophils Relative: 0 %
HCT: 35.3 % — ABNORMAL LOW (ref 39.0–52.0)
Hemoglobin: 10.8 g/dL — ABNORMAL LOW (ref 13.0–17.0)
Immature Granulocytes: 1 %
Lymphocytes Relative: 36 %
Lymphs Abs: 1.2 10*3/uL (ref 0.7–4.0)
MCH: 30.2 pg (ref 26.0–34.0)
MCHC: 30.6 g/dL (ref 30.0–36.0)
MCV: 98.6 fL (ref 80.0–100.0)
Monocytes Absolute: 0.6 10*3/uL (ref 0.1–1.0)
Monocytes Relative: 17 %
Neutro Abs: 1.4 10*3/uL — ABNORMAL LOW (ref 1.7–7.7)
Neutrophils Relative %: 45 %
Platelets: 196 10*3/uL (ref 150–400)
RBC: 3.58 MIL/uL — ABNORMAL LOW (ref 4.22–5.81)
RDW: 17.2 % — ABNORMAL HIGH (ref 11.5–15.5)
WBC: 3.2 10*3/uL — ABNORMAL LOW (ref 4.0–10.5)
nRBC: 0 % (ref 0.0–0.2)

## 2022-10-28 LAB — SAMPLE TO BLOOD BANK

## 2022-10-28 MED ORDER — SODIUM CHLORIDE 0.9 % IV SOLN
Freq: Once | INTRAVENOUS | Status: AC
  Filled 2022-10-28: qty 250

## 2022-10-28 NOTE — Progress Notes (Signed)
Oral Chemotherapy Clinic [facility]  [phone] [phone]  Patient Care Team: Donita Brooks, MD as PCP - General (Family Medicine) Antony Blackbird, MD as Consulting Physician (Radiation Oncology) West Bali, MD (Inactive) as Consulting Physician (Gastroenterology) Karie Soda, MD as Consulting Physician (General Surgery) Jeralyn Ruths, MD as Consulting Physician (Oncology)   Name of the patient: Dean [name]  [id number]  Mar 01, 1935   Date of visit: 10/28/22  HPI: Patient is a 87 y.o. male with multiple myeloma, currently treated with pomalidomide. Started in December 2023.  Reason for Consult: Oral chemotherapy follow-up for pomalidomide therapy.   PAST MEDICAL HISTORY: Past Medical History:  Diagnosis Date   BPH (benign prostatic hyperplasia)    Colonic diverticular abscess 01/08/2015   Diverticulitis 12/22/2014   complicated by abscess and required percutaneous drainage   GERD (gastroesophageal reflux disease)    H/O ETOH abuse    History of chemotherapy last done jan 2017   History of radiation therapy 01/05/13-02/10/13   45 gray to left occipital condyle region   History of radiation therapy 12/01/16-12/10/16   Parasternal nodule, chest- 24 Gy total delivered in 8 fractions, Left sacro-iliac, pelvis- 24 Gy total delivered in 8 fractions    History of radiation therapy 02/19/17-02/22/17   right temporal scalp 30 Gy in 10 fractions   History of radiation therapy    Right hip( Pelvis) 11/18/21-11/21/21-Dr. Antony Blackbird   Intra-abdominal abscess [facility])    Multiple myeloma (HCC) 2015   Neuropathy    Radiation 02/21/14-03/08/14   right posterior chest wall area 30 gray   Radiation 04/19/14-05/02/14   lumbar spine 25 gray   Skull lesion    Left occipital condyle   Wrist fracture, left    x 2    HEMATOLOGY/ONCOLOGY HISTORY:  Oncology History  Multiple myeloma without remission (HCC)  11/16/2012 Initial Diagnosis   L  occipital condyle destructive lesion, not biopsied secondary to location. XRT   12/01/2012 Imaging   L3 superior endplate compression FX, no lytic lesions to suggest myeloma   02/06/2014 Imaging   soft tissue mass along the right posterior fifth rib with some rib destruction   02/16/2014 Miscellaneous   Normal CBC, Normal CMP, kappa,lamda ratio of 2.62 (abnormal), UIEP with slightly restricted band, IgG kappa, SPEP/IEP with monoclonal protein at 0.79 g/dl, normal igG, suppressed IGM   02/20/2014 Bone Marrow Biopsy   33% kappa restricted plasma cells Normal FISH, normal cytogenetics   02/21/2014 - 03/08/2014 Radiation Therapy   30Gy to R rib lesion, lesion not biopsied   03/16/2014 PET scan   Scattered hypermetabolic osseous lesions, including the calvarium, ribs, left scapula, sacrum, and left femoral shaft. An expansile left vertebral body lesion at L3 extends into the epidural space of the left lateral recess,    03/17/2014 - 04/07/2014 Chemotherapy   Velcade and Dexamethasone due to initial denial of Revlimid by insurance company.  Zometa monthly.   03/22/2014 Imaging   MR_ L-Spine- Enhancing lesions compatible with multiple myeloma at L2, L3, and S1-2.   04/07/2014 - 07/28/2014 Chemotherapy   RVD with Revlimid at 25 mg days 1-14.  Revlimid dose reduced to 25 mg every other day x 14 days with 7 day respite beginning on day 1 of cycle 2.   04/17/2014 Adverse Reaction   Revlimid-induced rash.  Treated with steroids.   07/28/2014 - 08/04/2014 Chemotherapy   Revlimid daily   08/04/2014 Adverse Reaction   Velcade-induced peripheral neuropathy   08/04/2014 Treatment Plan Change  D/C Velcade   08/11/2014 PET scan   Response to therapy. Improvement and resolution of foci of osseous hypermetabolism.   08/22/2014 - 08/28/2014 Chemotherapy   Revlimid 25 mg days 1-21 every 28 days   08/28/2014 Adverse Reaction   Revlimid-induced rash. Medication held.  Medrol dose Pak prescribed.   09/04/2014 - 01/08/2015  Chemotherapy   Revlimid 10 mg every other day, without dexamethasone   01/08/2015 - 01/15/2015 Hospital Admission   Colonic diverticular abscess with IR drain placement   03/02/2015 PET scan   Only bony uptake is low activity at site of deformity/callus at R second rib FX, high activity in sigmoid colon, advanced sigmoid divertidulosis. Abscess not resolved?   06/04/2015 Imaging   CT nonobstructive L renal calculus, diverticulosis of descending and sigmoid colon without inflammation, moderate prostatic enlargement   07/12/2015 Surgery   Diverticulitis s/p robotic sigmoid colectomy with Dr. Michaell Cowing   08/23/2015 PET scan   Primarily similar hypermetabolic osseous foci within the R sided ribs. new L third rib focus of hypermetab and sclerosis is favored to be related to healing FX. no soft tissue myeloma id'd. Presumed postop hypermetab and edema about the R pelvic wall    03/06/2016 PET scan   1. Reduced activity in the prior lesion such as the right second and fifth rib lesions, activity nearly completely resolved and significantly less than added mediastinal blood pool activity. No new lesions are identified. 2. Other imaging findings of potential clinical significance: Coronary, aortic arch, and branch vessel atherosclerotic vascular disease. Aortoiliac atherosclerotic vascular disease. Enlarged prostate gland. Colonic diverticula.   02/17/2017 PET scan   HEAD/NECK: No hypermetabolic activity in the scalp. No hypermetabolic cervical lymph nodes. CHEST: No hypermetabolic mediastinal or hilar nodes. Right upper lobe scarring/atelectasis. No suspicious pulmonary nodules on the CT scan. ABDOMEN/PELVIS: No abnormal hypermetabolic activity within the liver, pancreas, adrenal glands, or spleen. No hypermetabolic lymph nodes in the abdomen or pelvis. Atherosclerotic calcifications of the abdominal aorta and branch vessels. Left renal sinus cysts. Sigmoid diverticulosis, without evidence of  diverticulitis. Prostatomegaly. SKELETON: Vague hypermetabolism involving the inferior sternum, max SUV 3.2, previously 8.2. Focal hypermetabolism involving the left sacrum, max SUV 4.0, previously 6.7. EXTREMITIES: No abnormal hypermetabolic activity in the lower extremities.   02/18/2017 -  Chemotherapy   Bortezomib 1.3mg /m2 QWk + Dexamethasone 10mg  QTue/Fri --Cycle #1, 02/18/17    02/18/2017 - 08/26/2017 Chemotherapy   The patient had bortezomib SQ (VELCADE) chemo injection 3 mg, 1.3 mg/m2 = 3 mg, Subcutaneous,  Once, 7 of 9 cycles Administration: 3 mg (02/18/2017), 3 mg (02/26/2017), 3 mg (03/04/2017), 3 mg (03/11/2017), 3 mg (04/08/2017), 3 mg (03/18/2017), 3 mg (03/25/2017), 3 mg (04/01/2017), 3 mg (05/06/2017), 3 mg (05/20/2017), 3 mg (06/03/2017), 3 mg (06/17/2017), 3 mg (07/01/2017), 3 mg (07/15/2017), 3 mg (07/29/2017), 3 mg (08/12/2017), 3 mg (08/26/2017)  for chemotherapy treatment.    09/17/2017 - 10/02/2021 Chemotherapy   Patient is on Treatment Plan : MYELOMA Daratumumab q28d     09/17/2017 - 12/04/2021 Chemotherapy   Patient is on Treatment Plan : MYELOMA Daratumumab SQ q28d       ALLERGIES:  is allergic to codeine, morphine and codeine, and revlimid [lenalidomide].  MEDICATIONS:  Current Outpatient Medications  Medication Sig Dispense Refill   acetaminophen (TYLENOL) 500 MG tablet Take 500-1,000 mg by mouth every 6 (six) hours as needed for mild pain or moderate pain.     Calcium Carbonate (CALCIUM 500 PO) Take 750 mg by mouth daily.     ciprofloxacin (  CIPRO) 500 MG tablet Take 1 tablet (500 mg total) by mouth 2 (two) times daily for 7 days. (Patient not taking: Reported on 10/28/2022) 14 tablet 0   cyanocobalamin (VITAMIN B12) 500 MCG tablet Take 1 tablet (500 mcg total) by mouth daily.     dexamethasone (DECADRON) 1 MG tablet Take 1 tablet (1 mg total) by mouth daily for 4 days.     ELIQUIS 5 MG TABS tablet TAKE ONE TABLET BY MOUTH TWICE DAILY 60 tablet 1   ferrous sulfate 325 (65 FE) MG  tablet Take 325 mg by mouth daily.     folic acid (FOLVITE) 1 MG tablet Take 1 tablet (1 mg total) by mouth daily. 90 tablet 1   furosemide (LASIX) 20 MG tablet Take 1 tablet (20 mg total) by mouth daily as needed. (Patient taking differently: Take 20 mg by mouth every other day.) 90 tablet 1   gabapentin (NEURONTIN) 400 MG capsule Take 1 capsule (400 mg total) by mouth 3 (three) times daily. (Patient taking differently: Take 400 mg by mouth 2 (two) times daily.) 90 capsule 4   guaiFENesin-dextromethorphan (ROBITUSSIN DM) 100-10 MG/5ML syrup Take 5 mLs by mouth every 4 (four) hours as needed for cough (chest congestion). 473 mL 0   lidocaine-prilocaine (EMLA) cream Apply small amount over port one (1) hour prior to appointment. 30 g 0   Melatonin 10 MG TABS Take 10 mg by mouth at bedtime.     metoprolol tartrate (LOPRESSOR) 25 MG tablet Take 0.5 tablets (12.5 mg total) by mouth 2 (two) times daily. 30 tablet 1   montelukast (SINGULAIR) 10 MG tablet Take 1 tablet (10 mg total) by mouth at bedtime. 90 tablet 1   pomalidomide (POMALYST) 3 MG capsule Take 1 capsule (3 mg total) by mouth daily. Take for 21 days, then hold for 7 days. Repeat every 28 days. 21 capsule 0   tamsulosin (FLOMAX) 0.4 MG CAPS capsule TAKE ONE CAPSULE BY MOUTH EVERY NIGHT ATBEDTIME 30 capsule 5   traZODone (DESYREL) 100 MG tablet TAKE THREE TABLETS BY MOUTH EVERY NIGHT AT BEDTIME (Patient taking differently: Take 100-300 mg by mouth at bedtime.) 90 tablet 1   TURMERIC PO Take 1 tablet by mouth 2 (two) times daily.     No current facility-administered medications for this visit.   Facility-Administered Medications Ordered in Other Visits  Medication Dose Route Frequency Provider Last Rate Last Admin   heparin lock flush 100 unit/mL  500 Units Intravenous Once Jeralyn Ruths, MD        VITAL SIGNS: There were no vitals taken for this visit. There were no vitals filed for this visit.  Estimated body mass index is 29.06  kg/m as calculated from the following:   Height as of an earlier encounter on 10/28/22: 5\' 8"  (1.727 m).   Weight as of 10/23/22: 86.7 kg (191 lb 2.2 oz).  LABS: CBC:    Component Value Date/Time   WBC 3.2 (L) 10/28/2022 0917   HGB 10.8 (L) 10/28/2022 0917   HCT 35.3 (L) 10/28/2022 0917   PLT 196 10/28/2022 0917   MCV 98.6 10/28/2022 0917   NEUTROABS 1.4 (L) 10/28/2022 0917   LYMPHSABS 1.2 10/28/2022 0917   MONOABS 0.6 10/28/2022 0917   EOSABS 0.0 10/28/2022 0917   BASOSABS 0.0 10/28/2022 0917   Comprehensive Metabolic Panel:    Component Value Date/Time   NA 137 10/28/2022 0917   K 3.8 10/28/2022 0917   CL 105 10/28/2022 0917   CO2  26 10/28/2022 0917   BUN 27 (H) 10/28/2022 0917   CREATININE 0.68 10/28/2022 0917   CREATININE 0.51 (L) 03/20/2022 0914   CREATININE 0.81 03/26/2021 1117   GLUCOSE 116 (H) 10/28/2022 0917   CALCIUM 8.7 (L) 10/28/2022 0917   AST 15 10/28/2022 0917   AST 18 03/20/2022 0914   ALT 12 10/28/2022 0917   ALT 13 03/20/2022 0914   ALKPHOS 20 (L) 10/28/2022 0917   BILITOT 0.6 10/28/2022 0917   BILITOT 0.4 03/20/2022 0914   PROT 6.5 10/28/2022 0917   ALBUMIN 2.5 (L) 10/28/2022 0917     Present during today's visit: patient and his daughter  Assessment and Plan: Patient will hold pomalidomide, to give more time for recovery and to complete abx course Patient will RTC in 2 weeks for reevaluation    Oral Chemotherapy Side Effect/Intolerance:  Fatigue: likely from recent hospitalization    Oral Chemotherapy Adherence: Patient's daughter reports giving him his pomalidomide daily while in the hospital and he only missed 2 doses in the last cycle  New medications: N/A treatment on hold  Medication Access Issues: N/A treatment on hold  Patient expressed understanding and was in agreement with this plan. He also understands that He can call clinic at any time with any questions, concerns, or complaints.   Follow-up plan: RTC in 2 weeks  Thank you  for allowing me to participate in the care of this very pleasant patient.   Time Total: 15 mins  Visit consisted of counseling and education on dealing with issues of symptom management in the setting of serious and potentially life-threatening illness.Greater than 50%  of this time was spent counseling and coordinating care related to the above assessment and plan.  Signed by: Remi Haggard, PharmD, BCPS, Nolon Bussing, CPP Hematology/Oncology Clinical Pharmacist Practitioner [place]/DB/AP Oral Chemotherapy Navigation Clinic [phone]  10/28/2022 3:00 PM

## 2022-10-28 NOTE — Progress Notes (Signed)
[facility]  [phone] [phone]  ID: [name]. OB: [birth year]  MR#: [id number]  AOZ#:[id number]  Patient Care Team: Donita Brooks, MD as PCP - General (Family Medicine) Antony Blackbird, MD as Consulting Physician (Radiation Oncology) West Bali, MD (Inactive) as Consulting Physician (Gastroenterology) Karie Soda, MD as Consulting Physician (General Surgery) Jeralyn Ruths, MD as Consulting Physician (Oncology)  CHIEF COMPLAINT: Multiple myeloma  INTERVAL HISTORY: Patient returns to clinic today for hospital follow-up and further evaluation.  He was recently admitted to the hospital after a fall and found to have sepsis with gram-negative cocci growing on blood cultures.  Patient was discharged to skilled nursing, but left after 1 day and never received his post discharge antibiotics.  Despite recommendations, he also continued Pomalyst.  He continues to have weakness and fatigue.  He continues to have chronic pain. He denies any fevers.  He has no neurologic complaints.  He has a poor appetite.  He has no chest pain, shortness of breath, cough, or hemoptysis.  He denies any nausea, vomiting, constipation, or diarrhea.  He has no urinary complaints.  Patient offers no further specific complaints today.  REVIEW OF SYSTEMS:   Review of Systems  Constitutional:  Positive for malaise/fatigue. Negative for fever and weight loss.  Respiratory: Negative.  Negative for cough, hemoptysis and shortness of breath.   Cardiovascular: Negative.  Negative for chest pain and leg swelling.  Gastrointestinal: Negative.  Negative for abdominal pain and nausea.  Genitourinary: Negative.  Negative for dysuria and flank pain.  Musculoskeletal:  Positive for back pain, falls and joint pain.  Skin: Negative.  Negative for rash.  Neurological:  Positive for sensory change and weakness. Negative for dizziness, focal weakness and headaches.   Psychiatric/Behavioral: Negative.  The patient is not nervous/anxious and does not have insomnia.     As per HPI. Otherwise, a complete review of systems is negative.  PAST MEDICAL HISTORY: Past Medical History:  Diagnosis Date   BPH (benign prostatic hyperplasia)    Colonic diverticular abscess 01/08/2015   Diverticulitis 12/22/2014   complicated by abscess and required percutaneous drainage   GERD (gastroesophageal reflux disease)    H/O ETOH abuse    History of chemotherapy last done jan 2017   History of radiation therapy 01/05/13-02/10/13   45 gray to left occipital condyle region   History of radiation therapy 12/01/16-12/10/16   Parasternal nodule, chest- 24 Gy total delivered in 8 fractions, Left sacro-iliac, pelvis- 24 Gy total delivered in 8 fractions    History of radiation therapy 02/19/17-02/22/17   right temporal scalp 30 Gy in 10 fractions   History of radiation therapy    Right hip( Pelvis) 11/18/21-11/21/21-Dr. Antony Blackbird   Intra-abdominal abscess (HCC)    Multiple myeloma (HCC) 2015   Neuropathy    Radiation 02/21/14-03/08/14   right posterior chest wall area 30 gray   Radiation 04/19/14-05/02/14   lumbar spine 25 gray   Skull lesion    Left occipital condyle   Wrist fracture, left    x 2    PAST SURGICAL HISTORY: Past Surgical History:  Procedure Laterality Date   COLONOSCOPY N/A 04/19/2015   Procedure: COLONOSCOPY;  Surgeon: West Bali, MD;  Location: [facility];  Service: Endoscopy;  Laterality: N/A;  1:30 PM   CYST EXCISION  1959   tail bone   CYSTOSCOPY WITH FULGERATION N/A 08/20/2022   Procedure: CYSTOSCOPY WITH FULGERATION;  Surgeon: Crista Elliot, MD;  Location: [facility];  Service: Urology;  Laterality: N/A;  cysto clot evac   IR IMAGING GUIDED PORT INSERTION  09/25/2017   IR REMOVAL TUN ACCESS W/ PORT W/O FL MOD SED  10/21/2022    FAMILY HISTORY: Family History  Problem Relation Age of Onset   Diabetes Mother    Stroke Mother     Hypertension Father     ADVANCED DIRECTIVES (Y/N):  N  HEALTH MAINTENANCE: Social History   Tobacco Use   Smoking status: Never   Smokeless tobacco: Never  Vaping Use   Vaping status: Never Used  Substance Use Topics   Alcohol use: No    Comment: " Not much no more"   Drug use: No     Colonoscopy:  PAP:  Bone density:  Lipid panel:  Allergies  Allergen Reactions   Codeine Other (See Comments)    Headache   Morphine And Codeine Other (See Comments)    Makes him feel weird   Revlimid [Lenalidomide] Other (See Comments)    "Causes me to become weak"    Current Outpatient Medications  Medication Sig Dispense Refill   acetaminophen (TYLENOL) 500 MG tablet Take 500-1,000 mg by mouth every 6 (six) hours as needed for mild pain or moderate pain.     Calcium Carbonate (CALCIUM 500 PO) Take 750 mg by mouth daily.     cyanocobalamin (VITAMIN B12) 500 MCG tablet Take 1 tablet (500 mcg total) by mouth daily.     dexamethasone (DECADRON) 1 MG tablet Take 1 tablet (1 mg total) by mouth daily for 4 days.     ELIQUIS 5 MG TABS tablet TAKE ONE TABLET BY MOUTH TWICE DAILY 60 tablet 1   ferrous sulfate 325 (65 FE) MG tablet Take 325 mg by mouth daily.     folic acid (FOLVITE) 1 MG tablet Take 1 tablet (1 mg total) by mouth daily. 90 tablet 1   furosemide (LASIX) 20 MG tablet Take 1 tablet (20 mg total) by mouth daily as needed. (Patient taking differently: Take 20 mg by mouth every other day.) 90 tablet 1   gabapentin (NEURONTIN) 400 MG capsule Take 1 capsule (400 mg total) by mouth 3 (three) times daily. (Patient taking differently: Take 400 mg by mouth 2 (two) times daily.) 90 capsule 4   guaiFENesin-dextromethorphan (ROBITUSSIN DM) 100-10 MG/5ML syrup Take 5 mLs by mouth every 4 (four) hours as needed for cough (chest congestion). 473 mL 0   lidocaine-prilocaine (EMLA) cream Apply small amount over port one (1) hour prior to appointment. 30 g 0   Melatonin 10 MG TABS Take 10 mg by mouth  at bedtime.     metoprolol tartrate (LOPRESSOR) 25 MG tablet Take 0.5 tablets (12.5 mg total) by mouth 2 (two) times daily. 30 tablet 1   montelukast (SINGULAIR) 10 MG tablet Take 1 tablet (10 mg total) by mouth at bedtime. 90 tablet 1   pomalidomide (POMALYST) 3 MG capsule Take 1 capsule (3 mg total) by mouth daily. Take for 21 days, then hold for 7 days. Repeat every 28 days. 21 capsule 0   tamsulosin (FLOMAX) 0.4 MG CAPS capsule TAKE ONE CAPSULE BY MOUTH EVERY NIGHT ATBEDTIME 30 capsule 5   traZODone (DESYREL) 100 MG tablet TAKE THREE TABLETS BY MOUTH EVERY NIGHT AT BEDTIME (Patient taking differently: Take 100-300 mg by mouth at bedtime.) 90 tablet 1   TURMERIC PO Take 1 tablet by mouth 2 (two) times daily.     ciprofloxacin (CIPRO) 500 MG tablet  Take 1 tablet (500 mg total) by mouth 2 (two) times daily for 7 days. (Patient not taking: Reported on 10/28/2022) 14 tablet 0   No current facility-administered medications for this visit.   Facility-Administered Medications Ordered in Other Visits  Medication Dose Route Frequency Provider Last Rate Last Admin   heparin lock flush 100 unit/mL  500 Units Intravenous Once Jeralyn Ruths, MD        OBJECTIVE: Vitals:   10/28/22 0900  BP: (!) 85/71  Pulse: 77  Resp: 16  Temp: (!) 96.8 F (36 C)  SpO2: 97%      Body mass index is 29.06 kg/m.    ECOG FS:2 - Symptomatic, <50% confined to bed  General: Well-developed, well-nourished, no acute distress.  Sitting in a wheelchair. Eyes: Pink conjunctiva, anicteric sclera. HEENT: Normocephalic, moist mucous membranes. Lungs: No audible wheezing or coughing. Heart: Regular rate and rhythm. Abdomen: Soft, nontender, no obvious distention. Musculoskeletal: No edema, cyanosis, or clubbing. Neuro: Alert, answering all questions appropriately. Cranial nerves grossly intact. Skin: No rashes or petechiae noted. Psych: Normal affect.    LAB RESULTS:  Lab Results  Component Value Date   NA  137 10/28/2022   K 3.8 10/28/2022   CL 105 10/28/2022   CO2 26 10/28/2022   GLUCOSE 116 (H) 10/28/2022   BUN 27 (H) 10/28/2022   CREATININE 0.68 10/28/2022   CALCIUM 8.7 (L) 10/28/2022   PROT 6.5 10/28/2022   ALBUMIN 2.5 (L) 10/28/2022   AST 15 10/28/2022   ALT 12 10/28/2022   ALKPHOS 20 (L) 10/28/2022   BILITOT 0.6 10/28/2022   GFRNONAA >60 10/28/2022   GFRAA >60 10/13/2019    Lab Results  Component Value Date   WBC 3.2 (L) 10/28/2022   NEUTROABS 1.4 (L) 10/28/2022   HGB 10.8 (L) 10/28/2022   HCT 35.3 (L) 10/28/2022   MCV 98.6 10/28/2022   PLT 196 10/28/2022     STUDIES: DG Tibia/Fibula Right  Result Date: 10/23/2022 CLINICAL DATA:  Right leg and knee pain, history of multiple myeloma EXAM: RIGHT KNEE - COMPLETE 4+ VIEW; RIGHT TIBIA AND FIBULA - 2 VIEW COMPARISON:  None Available. FINDINGS: Right knee: Frontal, bilateral oblique, lateral views of the right knee are obtained. No acute fracture, subluxation, or dislocation. There is mild 3 compartmental osteoarthritis. No joint effusion. Mild subcutaneous edema. Right tibia/fibula: Frontal and lateral views are obtained. There are no acute fractures. There are well-circumscribed lytic lesions within the proximal left tibia, largest in the posterior cortex of the proximal diaphysis measuring up to 1.5 cm. Similar lesion is seen within the posterior cortex of the proximal fibular diaphysis measuring up to 1.4 cm. Findings are consistent with known history of multiple myeloma. There are no acute displaced fractures. Mild osteoarthritis of the right knee and ankle. Soft tissues are unremarkable. IMPRESSION: 1. Circumscribed lytic lesions within the proximal tibia and fibula, compatible with known history of multiple lumbar. 2. Mild right knee and right ankle osteoarthritis. 3. No acute bony abnormality. Electronically Signed   By: Sharlet Salina M.D.   On: 10/23/2022 16:24   DG Knee Complete 4 Views Right  Result Date:  10/23/2022 CLINICAL DATA:  Right leg and knee pain, history of multiple myeloma EXAM: RIGHT KNEE - COMPLETE 4+ VIEW; RIGHT TIBIA AND FIBULA - 2 VIEW COMPARISON:  None Available. FINDINGS: Right knee: Frontal, bilateral oblique, lateral views of the right knee are obtained. No acute fracture, subluxation, or dislocation. There is mild 3 compartmental osteoarthritis. No joint effusion.  Mild subcutaneous edema. Right tibia/fibula: Frontal and lateral views are obtained. There are no acute fractures. There are well-circumscribed lytic lesions within the proximal left tibia, largest in the posterior cortex of the proximal diaphysis measuring up to 1.5 cm. Similar lesion is seen within the posterior cortex of the proximal fibular diaphysis measuring up to 1.4 cm. Findings are consistent with known history of multiple myeloma. There are no acute displaced fractures. Mild osteoarthritis of the right knee and ankle. Soft tissues are unremarkable. IMPRESSION: 1. Circumscribed lytic lesions within the proximal tibia and fibula, compatible with known history of multiple lumbar. 2. Mild right knee and right ankle osteoarthritis. 3. No acute bony abnormality. Electronically Signed   By: Sharlet Salina M.D.   On: 10/23/2022 16:24   IR REMOVAL TUN ACCESS W/ PORT W/O FL MOD SED  Result Date: 10/21/2022 INDICATION: Bacteremia EXAM: REMOVAL RIGHT IJ VEIN PORT-A-CATH MEDICATIONS: None ANESTHESIA/SEDATION: Moderate (conscious) sedation was employed during this procedure. A total of Versed 0.5 mg and Fentanyl 25 mcg was administered intravenously by the radiology nurse. Total intra-service moderate Sedation Time: 11 minutes. The patient's level of consciousness and vital signs were monitored continuously by radiology nursing throughout the procedure under my direct supervision. FLUOROSCOPY: None COMPLICATIONS: None immediate. PROCEDURE: Informed written consent was obtained from the patient after a thorough discussion of the  procedural risks, benefits and alternatives. All questions were addressed. Maximal Sterile Barrier Technique was utilized including caps, mask, sterile gowns, sterile gloves, sterile drape, hand hygiene and skin antiseptic. A timeout was performed prior to the initiation of the procedure. Right anterior upper chest prepped and draped in the usual sterile fashion. Following local lidocaine administration, an incision was made at the superior margin of the port pocket. The pocket was dissected, and the port and catheter were removed in their entirety. The incision was closed with deep and superficial absorbable suture and sealed with Dermabond. The catheter tip was sent for Gram stain and culture. IMPRESSION: Successful right IJ vein Port-A-Cath explant. Electronically Signed   By: Acquanetta Belling M.D.   On: 10/21/2022 15:50   DG Chest 1 View  Result Date: 10/18/2022 CLINICAL DATA:  CHF. EXAM: CHEST  1 VIEW COMPARISON:  10/17/2022 FINDINGS: The cardio pericardial silhouette is enlarged. Vascular congestion with diffuse interstitial opacity suggesting edema. There is left base collapse/consolidation with small bilateral pleural effusions. Right Port-A-Cath again noted. Telemetry leads overlie the chest. IMPRESSION: 1. Vascular congestion with diffuse interstitial opacity suggesting edema. 2. Left base collapse/consolidation with small bilateral pleural effusions. Electronically Signed   By: Kennith Center M.D.   On: 10/18/2022 07:07   DG Forearm Right  Result Date: 10/17/2022 CLINICAL DATA:  Sepsis.  Laceration.  History of myeloma. EXAM: RIGHT FOREARM - 2 VIEW COMPARISON:  None Available. FINDINGS: No acute fracture or dislocation. Preserved adjacent joint spaces. Vascular calcifications. There are subcortical lucencies along the midshaft of the radius and ulna consistent with the patient's history of multiple myeloma. IMPRESSION: No acute osseous abnormality. Presumed myelomatous lesions involving the midshaft of  the radius and ulna. Electronically Signed   By: Karen Kays M.D.   On: 10/17/2022 13:14   DG Chest Port 1 View  Result Date: 10/17/2022 CLINICAL DATA:  Questionable sepsis EXAM: PORTABLE CHEST 1 VIEW COMPARISON:  X-ray 08/23/2022 FINDINGS: Enlarged heart. Tortuous and ectatic aorta with stable widening of the mediastinum. Small pleural effusions with some adjacent opacity, left-greater-than-right. Increasing on the left compared to previous. Is also increasing vascular congestion and interstitial thickening.  Old left-sided rib fractures. Osteopenia. Overlapping cardiac leads. Stable right IJ chest port with tip at the central SVC. IMPRESSION: Chest port. Enlarged heart with increasing vascular congestion and some interstitial changes. Increasing left retrocardiac opacity.  Persistent small effusions. Recommend follow-up Electronically Signed   By: Karen Kays M.D.   On: 10/17/2022 13:13   CT Head Wo Contrast  Result Date: 10/17/2022 CLINICAL DATA:  Provided history: Facial trauma, blunt. Head trauma. Multiple recent falls (on anticoagulation). Weakness. Additional history obtained from electronic MEDICAL RECORD NUMBERHistory of multiple myeloma. EXAM: CT HEAD WITHOUT CONTRAST TECHNIQUE: Contiguous axial images were obtained from the base of the skull through the vertex without intravenous contrast. RADIATION DOSE REDUCTION: This exam was performed according to the departmental dose-optimization program which includes automated exposure control, adjustment of the mA and/or kV according to patient size and/or use of iterative reconstruction technique. COMPARISON:  Brain MRI 09/06/2018.  CT angiogram head 11/29/2012. FINDINGS: Brain: Generalized cerebral atrophy. Patchy and ill-defined hypoattenuation within the cerebral white matter, nonspecific but compatible with mild chronic small vessel ischemic disease. There is no acute intracranial hemorrhage. No demarcated cortical infarct. No extra-axial fluid  collection. No evidence of an intracranial mass. No midline shift. Vascular: No hyperdense vessel.  Atherosclerotic calcifications. Skull: No calvarial fracture. Known previously treated lesion within the left occipital calvarium. Fairly numerous lucent lesions scattered elsewhere within the bilateral calvarium, which are new from the prior head CT of 11/29/2012. Additionally, there is a new 14 mm expansile/destructive osseous lesion (with soft tissue components) in the anterior aspect of the right zygomatic arch, encroaching upon the lateral aspect of the right maxillary sinus (series 3, image 9). Sinuses/Orbits: No orbital mass or acute orbital finding. Mild mucosal thickening, and small fluid level, within the right sphenoid sinus. Partial opacification of the right sphenoethmoidal recess. IMPRESSION: 1. No evidence of an acute intracranial abnormality. 2. Mild chronic small vessel ischemic changes within the cerebral white matter. 3. Generalized cerebral atrophy. 4. Known previously treated lesion within the left occipital calvarium. 5. Fairly numerous lucent lesions scattered elsewhere within the bilateral calvarium which are new from the prior head CT of 11/29/2012. Additionally, there is a new 14 mm expansile/destructive osseous lesion (with soft tissue components) in the anterior aspect of the right zygomatic arch, encroaching upon the lateral aspect of the right maxillary sinus. These new lesions likely reflect sequelae of multiple myeloma given the patient's history. 6. Right sphenoid sinusitis. Electronically Signed   By: Jackey Loge D.O.   On: 10/17/2022 13:07    ASSESSMENT: Multiple myeloma  PLAN:  Multiple myeloma: Please see note from September 17, 2020 for historic details of his myeloma and treatment.  Since reinitiating daratumumab, patient's M spike trended down to 3.6, but then increased back to 5.3.  He was switched to single agent Pomalyst and his M spike has ranged between 1.3 and 2.0  since March 20, 2022.  IgG has trended up to 2452, but kappa free light chains have actually trended down to 99.7.  Today's results are pending.  Hospice was previously discussed, which patient declined.  He expressed understanding that there are likely no other treatments available if Pomalyst were no longer working.  Patient has been instructed to discontinue Pomalyst at this time.  He will receive IV fluids today.  Return to clinic in 2 weeks for further evaluation, consideration of Zometa and IV fluids, and reinitiation of Pomalyst if appropriate.  Appreciate clinical pharmacy input. Back/joint pain: Chronic and unchanged.  Patient is  receiving XRT to his right hip/pelvis.  MRI results reviewed independently consistent with progressive myeloma.  No pathologic fractures reported.  Continue current narcotics as prescribed.    Peripheral neuropathy: Chronic and unchanged.  Continue gabapentin as prescribed. Insomnia/anxiety: Chronic and unchanged.  Patient is now having his medications adjusted by primary care.   Anemia: Hemoglobin is 10.8 today. Hypocalcemia: Given patient's low albumin this corrects to near normal.  Hold Zometa today. Pulmonary embolism: Patient is now on Eliquis. Gram-negative cocci sepsis: Patient was given a prescription for Cipro 500 mg twice a day for 7 days.  Hold Pomalyst as above. Weakness and fatigue: Patient reports he has home health and home physical therapy in place.  Patient also confirms DNR/DNI.   Patient expressed understanding and was in agreement with this plan. He also understands that He can call clinic at any time with any questions, concerns, or complaints.    Jeralyn Ruths, MD   10/28/2022 10:21 AM

## 2022-10-28 NOTE — Telephone Encounter (Signed)
Received call from [facility] with [facility] to request verbal orders for evaluation concerning the following:  - Home Health RN  - PT - OT  Please advise at [phone]. Ok to leave a detailed voicemail on this confidential line.

## 2022-10-29 LAB — IGG, IGA, IGM
IgA: 83 mg/dL (ref 61–437)
IgG (Immunoglobin G), Serum: 2029 mg/dL — ABNORMAL HIGH (ref 603–1613)
IgM (Immunoglobulin M), Srm: 41 mg/dL (ref 15–143)

## 2022-10-29 LAB — KAPPA/LAMBDA LIGHT CHAINS
Kappa free light chain: 568.4 mg/L — ABNORMAL HIGH (ref 3.3–19.4)
Kappa, lambda light chain ratio: 37.64 — ABNORMAL HIGH (ref 0.26–1.65)
Lambda free light chains: 15.1 mg/L (ref 5.7–26.3)

## 2022-10-30 ENCOUNTER — Other Ambulatory Visit: Payer: Self-pay | Admitting: *Deleted

## 2022-10-30 ENCOUNTER — Telehealth: Payer: Self-pay | Admitting: *Deleted

## 2022-10-30 MED ORDER — CIPROFLOXACIN HCL 500 MG PO TABS: 500.0000 mg | ORAL_TABLET | Freq: Two times a day (BID) | ORAL | 0 refills | Status: DC

## 2022-10-30 NOTE — Telephone Encounter (Signed)
Pharmacy called stating that family said new prescription for Cipro was to have been sent Tuesday and pharmacy does not have it. Please advise if you are refilling this prescription ordered by Dr Dean [name] 10/24/22 for # 14 tabs to take twice a day, Looks like it was a printed prescription

## 2022-10-31 LAB — PROTEIN ELECTROPHORESIS, SERUM
A/G Ratio: 0.8 (ref 0.7–1.7)
Albumin ELP: 2.5 g/dL — ABNORMAL LOW (ref 2.9–4.4)
Alpha-1-Globulin: 0.4 g/dL (ref 0.0–0.4)
Alpha-2-Globulin: 0.7 g/dL (ref 0.4–1.0)
Beta Globulin: 0.8 g/dL (ref 0.7–1.3)
Gamma Globulin: 1.5 g/dL (ref 0.4–1.8)
Globulin, Total: 3.3 g/dL (ref 2.2–3.9)
M-Spike, %: 1.4 g/dL — ABNORMAL HIGH
Total Protein ELP: 5.8 g/dL — ABNORMAL LOW (ref 6.0–8.5)

## 2022-11-04 NOTE — Telephone Encounter (Signed)
Received call from Creta Levin from [facility] to follow up on PT orders.  Requesting a verbal for PT plan of care as once a week for 8 weeks.   Please advise at [phone]. Ok to leave verbal order on this secured voicemail.

## 2022-11-05 ENCOUNTER — Telehealth: Payer: Self-pay | Admitting: Family Medicine

## 2022-11-05 NOTE — Telephone Encounter (Signed)
Received call from [facility] with [facility]; stated OT evaluation is complete.   Esther requesting verbal orders for OT for 2 times a  week for 4 weeks.  Please advise Darral Dash at [phone] which has a secured voicemail; ok to leave a message.

## 2022-11-06 ENCOUNTER — Other Ambulatory Visit: Payer: Self-pay | Admitting: Oncology

## 2022-11-10 ENCOUNTER — Inpatient Hospital Stay: Payer: Medicare Other

## 2022-11-10 ENCOUNTER — Encounter: Payer: Self-pay | Admitting: Oncology

## 2022-11-10 ENCOUNTER — Inpatient Hospital Stay (HOSPITAL_BASED_OUTPATIENT_CLINIC_OR_DEPARTMENT_OTHER): Payer: Medicare Other | Admitting: Oncology

## 2022-11-10 ENCOUNTER — Inpatient Hospital Stay: Payer: Medicare Other | Attending: Oncology

## 2022-11-10 VITALS — BP 115/35 | HR 93 | Temp 97.0°F | Resp 19 | Wt 201.7 lb

## 2022-11-10 DIAGNOSIS — D649 Anemia, unspecified: Secondary | ICD-10-CM | POA: Diagnosis not present

## 2022-11-10 DIAGNOSIS — M2550 Pain in unspecified joint: Secondary | ICD-10-CM | POA: Diagnosis not present

## 2022-11-10 DIAGNOSIS — G629 Polyneuropathy, unspecified: Secondary | ICD-10-CM | POA: Insufficient documentation

## 2022-11-10 DIAGNOSIS — F419 Anxiety disorder, unspecified: Secondary | ICD-10-CM | POA: Diagnosis not present

## 2022-11-10 DIAGNOSIS — I2699 Other pulmonary embolism without acute cor pulmonale: Secondary | ICD-10-CM | POA: Insufficient documentation

## 2022-11-10 DIAGNOSIS — C9000 Multiple myeloma not having achieved remission: Secondary | ICD-10-CM | POA: Diagnosis not present

## 2022-11-10 DIAGNOSIS — G4700 Insomnia, unspecified: Secondary | ICD-10-CM | POA: Insufficient documentation

## 2022-11-10 DIAGNOSIS — R5383 Other fatigue: Secondary | ICD-10-CM | POA: Insufficient documentation

## 2022-11-10 DIAGNOSIS — M549 Dorsalgia, unspecified: Secondary | ICD-10-CM | POA: Diagnosis not present

## 2022-11-10 DIAGNOSIS — R531 Weakness: Secondary | ICD-10-CM | POA: Diagnosis not present

## 2022-11-10 DIAGNOSIS — Z7901 Long term (current) use of anticoagulants: Secondary | ICD-10-CM | POA: Diagnosis not present

## 2022-11-10 LAB — CBC WITH DIFFERENTIAL/PLATELET
Abs Immature Granulocytes: 0.1 10*3/uL — ABNORMAL HIGH (ref 0.00–0.07)
Basophils Absolute: 0.1 10*3/uL (ref 0.0–0.1)
Basophils Relative: 2 %
Eosinophils Absolute: 0 10*3/uL (ref 0.0–0.5)
Eosinophils Relative: 1 %
HCT: 35.5 % — ABNORMAL LOW (ref 39.0–52.0)
Hemoglobin: 10.9 g/dL — ABNORMAL LOW (ref 13.0–17.0)
Immature Granulocytes: 2 %
Lymphocytes Relative: 28 %
Lymphs Abs: 1.5 10*3/uL (ref 0.7–4.0)
MCH: 30.9 pg (ref 26.0–34.0)
MCHC: 30.7 g/dL (ref 30.0–36.0)
MCV: 100.6 fL — ABNORMAL HIGH (ref 80.0–100.0)
Monocytes Absolute: 0.4 10*3/uL (ref 0.1–1.0)
Monocytes Relative: 7 %
Neutro Abs: 3.4 10*3/uL (ref 1.7–7.7)
Neutrophils Relative %: 60 %
Platelets: 169 10*3/uL (ref 150–400)
RBC: 3.53 MIL/uL — ABNORMAL LOW (ref 4.22–5.81)
RDW: 18.4 % — ABNORMAL HIGH (ref 11.5–15.5)
WBC: 5.5 10*3/uL (ref 4.0–10.5)
nRBC: 0 % (ref 0.0–0.2)

## 2022-11-10 LAB — COMPREHENSIVE METABOLIC PANEL
ALT: 16 U/L (ref 0–44)
AST: 19 U/L (ref 15–41)
Albumin: 2.5 g/dL — ABNORMAL LOW (ref 3.5–5.0)
Alkaline Phosphatase: 19 U/L — ABNORMAL LOW (ref 38–126)
Anion gap: 5 (ref 5–15)
BUN: 23 mg/dL (ref 8–23)
CO2: 26 mmol/L (ref 22–32)
Calcium: 7.9 mg/dL — ABNORMAL LOW (ref 8.9–10.3)
Chloride: 105 mmol/L (ref 98–111)
Creatinine, Ser: 0.86 mg/dL (ref 0.61–1.24)
GFR, Estimated: >60 mL/min (ref >60)
Glucose, Bld: 86 mg/dL (ref 70–99)
Potassium: 3.6 mmol/L (ref 3.5–5.1)
Sodium: 136 mmol/L (ref 135–145)
Total Bilirubin: 0.5 mg/dL (ref 0.3–1.2)
Total Protein: 6.9 g/dL (ref 6.5–8.1)

## 2022-11-10 LAB — SAMPLE TO BLOOD BANK

## 2022-11-10 NOTE — Progress Notes (Signed)
[facility]  Telephone:([phone] [phone]  ID: Dean [name]. OB: 10/14/1935  MR#: [id number]  AOZ#:[id number]  Patient Care Team: Donita Brooks, MD as PCP - General (Family Medicine) Antony Blackbird, MD as Consulting Physician (Radiation Oncology) West Bali, MD (Inactive) as Consulting Physician (Gastroenterology) Karie Soda, MD as Consulting Physician (General Surgery) Jeralyn Ruths, MD as Consulting Physician (Oncology)  CHIEF COMPLAINT: Multiple myeloma  INTERVAL HISTORY: Patient returns to clinic today for further evaluation and consideration of reinitiating Pomalyst.  His daughter reports he was getting stronger and working well with physical therapy and Occupational Therapy, but today had difficulty getting out of bed requiring EMS help to get him to the car.   He continues to have weakness and fatigue.  He continues to have chronic pain. He denies any fevers.  He has no neurologic complaints.  He has a poor appetite.  He has no chest pain, shortness of breath, cough, or hemoptysis.  He denies any nausea, vomiting, constipation, or diarrhea.  He has no urinary complaints.  Patient offers no further specific complaints today.  REVIEW OF SYSTEMS:   Review of Systems  Constitutional:  Positive for malaise/fatigue. Negative for fever and weight loss.  Respiratory: Negative.  Negative for cough, hemoptysis and shortness of breath.   Cardiovascular: Negative.  Negative for chest pain and leg swelling.  Gastrointestinal: Negative.  Negative for abdominal pain and nausea.  Genitourinary: Negative.  Negative for dysuria and flank pain.  Musculoskeletal:  Positive for back pain, falls and joint pain.  Skin: Negative.  Negative for rash.  Neurological:  Positive for sensory change and weakness. Negative for dizziness, focal weakness and headaches.  Psychiatric/Behavioral: Negative.  The patient is not nervous/anxious and does not have  insomnia.     As per HPI. Otherwise, a complete review of systems is negative.  PAST MEDICAL HISTORY: Past Medical History:  Diagnosis Date   BPH (benign prostatic hyperplasia)    Colonic diverticular abscess 01/08/2015   Diverticulitis 12/22/2014   complicated by abscess and required percutaneous drainage   GERD (gastroesophageal reflux disease)    H/O ETOH abuse    History of chemotherapy last done jan 2017   History of radiation therapy 01/05/13-02/10/13   45 gray to left occipital condyle region   History of radiation therapy 12/01/16-12/10/16   Parasternal nodule, chest- 24 Gy total delivered in 8 fractions, Left sacro-iliac, pelvis- 24 Gy total delivered in 8 fractions    History of radiation therapy 02/19/17-02/22/17   right temporal scalp 30 Gy in 10 fractions   History of radiation therapy    Right hip( Pelvis) 11/18/21-11/21/21-Dr. Antony Blackbird   Intra-abdominal abscess (HCC)    Multiple myeloma (HCC) 2015   Neuropathy    Radiation 02/21/14-03/08/14   right posterior chest wall area 30 gray   Radiation 04/19/14-05/02/14   lumbar spine 25 gray   Skull lesion    Left occipital condyle   Wrist fracture, left    x 2    PAST SURGICAL HISTORY: Past Surgical History:  Procedure Laterality Date   COLONOSCOPY N/A 04/19/2015   Procedure: COLONOSCOPY;  Surgeon: West Bali, MD;  Location: [facility];  Service: Endoscopy;  Laterality: N/A;  1:30 PM   CYST EXCISION  1959   tail bone   CYSTOSCOPY WITH FULGERATION N/A 08/20/2022   Procedure: CYSTOSCOPY WITH FULGERATION;  Surgeon: Crista Elliot, MD;  Location: [facility];  Service: Urology;  Laterality: N/A;  cysto clot  evac   IR IMAGING GUIDED PORT INSERTION  09/25/2017   IR REMOVAL TUN ACCESS W/ PORT W/O FL MOD SED  10/21/2022    FAMILY HISTORY: Family History  Problem Relation Age of Onset   Diabetes Mother    Stroke Mother    Hypertension Father     ADVANCED DIRECTIVES (Y/N):  N  HEALTH MAINTENANCE: Social History    Tobacco Use   Smoking status: Never   Smokeless tobacco: Never  Vaping Use   Vaping status: Never Used  Substance Use Topics   Alcohol use: No    Comment: " Not much no more"   Drug use: No     Colonoscopy:  PAP:  Bone density:  Lipid panel:  Allergies  Allergen Reactions   Codeine Other (See Comments)    Headache   Morphine And Codeine Other (See Comments)    Makes him feel weird   Revlimid [Lenalidomide] Other (See Comments)    "Causes me to become weak"    Current Outpatient Medications  Medication Sig Dispense Refill   acetaminophen (TYLENOL) 500 MG tablet Take 500-1,000 mg by mouth every 6 (six) hours as needed for mild pain or moderate pain.     Calcium Carbonate (CALCIUM 500 PO) Take 750 mg by mouth daily.     cyanocobalamin (VITAMIN B12) 500 MCG tablet Take 1 tablet (500 mcg total) by mouth daily.     ELIQUIS 5 MG TABS tablet TAKE ONE TABLET BY MOUTH TWICE DAILY 60 tablet 1   ferrous sulfate 325 (65 FE) MG tablet Take 325 mg by mouth daily.     folic acid (FOLVITE) 1 MG tablet TAKE ONE TABLET (1 MG TOTAL) BY MOUTH DAILY. 90 tablet 1   furosemide (LASIX) 20 MG tablet Take 1 tablet (20 mg total) by mouth daily as needed. (Patient taking differently: Take 20 mg by mouth every other day.) 90 tablet 1   gabapentin (NEURONTIN) 400 MG capsule Take 1 capsule (400 mg total) by mouth 3 (three) times daily. (Patient taking differently: Take 400 mg by mouth 2 (two) times daily.) 90 capsule 4   guaiFENesin-dextromethorphan (ROBITUSSIN DM) 100-10 MG/5ML syrup Take 5 mLs by mouth every 4 (four) hours as needed for cough (chest congestion). 473 mL 0   lidocaine-prilocaine (EMLA) cream Apply small amount over port one (1) hour prior to appointment. 30 g 0   Melatonin 10 MG TABS Take 10 mg by mouth at bedtime.     metoprolol tartrate (LOPRESSOR) 25 MG tablet Take 0.5 tablets (12.5 mg total) by mouth 2 (two) times daily. 30 tablet 1   montelukast (SINGULAIR) 10 MG tablet Take 1  tablet (10 mg total) by mouth at bedtime. 90 tablet 1   Oxycodone HCl 10 MG TABS Take 1 tablet (10 mg total) by mouth 3 (three) times daily as needed. 90 tablet 0   pomalidomide (POMALYST) 3 MG capsule Take 1 capsule (3 mg total) by mouth daily. Take for 21 days, then hold for 7 days. Repeat every 28 days. 21 capsule 0   tamsulosin (FLOMAX) 0.4 MG CAPS capsule TAKE ONE CAPSULE BY MOUTH EVERY NIGHT ATBEDTIME 30 capsule 5   traZODone (DESYREL) 100 MG tablet TAKE THREE TABLETS BY MOUTH EVERY NIGHT AT BEDTIME (Patient taking differently: Take 100-300 mg by mouth at bedtime.) 90 tablet 1   TURMERIC PO Take 1 tablet by mouth 2 (two) times daily.     ciprofloxacin (CIPRO) 500 MG tablet Take 1 tablet (500 mg total) by mouth 2 (  two) times daily. (Patient not taking: Reported on 11/10/2022) 14 tablet 0   No current facility-administered medications for this visit.   Facility-Administered Medications Ordered in Other Visits  Medication Dose Route Frequency Provider Last Rate Last Admin   heparin lock flush 100 unit/mL  500 Units Intravenous Once Jeralyn Ruths, MD        OBJECTIVE: Vitals:   11/10/22 0936  BP: (!) 115/35  Pulse: 93  Resp: 19  Temp: (!) 97 F (36.1 C)  SpO2: 99%      Body mass index is 30.67 kg/m.    ECOG FS:2 - Symptomatic, <50% confined to bed  General: Well-developed, well-nourished, no acute distress.  Sitting in a wheelchair. Eyes: Pink conjunctiva, anicteric sclera. HEENT: Normocephalic, moist mucous membranes. Lungs: No audible wheezing or coughing. Heart: Regular rate and rhythm. Abdomen: Soft, nontender, no obvious distention. Musculoskeletal: No edema, cyanosis, or clubbing. Neuro: Alert, answering all questions appropriately. Cranial nerves grossly intact. Skin: No rashes or petechiae noted. Psych: Normal affect.  LAB RESULTS:  Lab Results  Component Value Date   NA 136 11/10/2022   K 3.6 11/10/2022   CL 105 11/10/2022   CO2 26 11/10/2022   GLUCOSE  86 11/10/2022   BUN 23 11/10/2022   CREATININE 0.86 11/10/2022   CALCIUM 7.9 (L) 11/10/2022   PROT 6.9 11/10/2022   ALBUMIN 2.5 (L) 11/10/2022   AST 19 11/10/2022   ALT 16 11/10/2022   ALKPHOS 19 (L) 11/10/2022   BILITOT 0.5 11/10/2022   GFRNONAA >60 11/10/2022   GFRAA >60 10/13/2019    Lab Results  Component Value Date   WBC 5.5 11/10/2022   NEUTROABS 3.4 11/10/2022   HGB 10.9 (L) 11/10/2022   HCT 35.5 (L) 11/10/2022   MCV 100.6 (H) 11/10/2022   PLT 169 11/10/2022     STUDIES: DG Tibia/Fibula Right  Result Date: 10/23/2022 CLINICAL DATA:  Right leg and knee pain, history of multiple myeloma EXAM: RIGHT KNEE - COMPLETE 4+ VIEW; RIGHT TIBIA AND FIBULA - 2 VIEW COMPARISON:  None Available. FINDINGS: Right knee: Frontal, bilateral oblique, lateral views of the right knee are obtained. No acute fracture, subluxation, or dislocation. There is mild 3 compartmental osteoarthritis. No joint effusion. Mild subcutaneous edema. Right tibia/fibula: Frontal and lateral views are obtained. There are no acute fractures. There are well-circumscribed lytic lesions within the proximal left tibia, largest in the posterior cortex of the proximal diaphysis measuring up to 1.5 cm. Similar lesion is seen within the posterior cortex of the proximal fibular diaphysis measuring up to 1.4 cm. Findings are consistent with known history of multiple myeloma. There are no acute displaced fractures. Mild osteoarthritis of the right knee and ankle. Soft tissues are unremarkable. IMPRESSION: 1. Circumscribed lytic lesions within the proximal tibia and fibula, compatible with known history of multiple lumbar. 2. Mild right knee and right ankle osteoarthritis. 3. No acute bony abnormality. Electronically Signed   By: Sharlet Salina M.D.   On: 10/23/2022 16:24   DG Knee Complete 4 Views Right  Result Date: 10/23/2022 CLINICAL DATA:  Right leg and knee pain, history of multiple myeloma EXAM: RIGHT KNEE - COMPLETE 4+ VIEW;  RIGHT TIBIA AND FIBULA - 2 VIEW COMPARISON:  None Available. FINDINGS: Right knee: Frontal, bilateral oblique, lateral views of the right knee are obtained. No acute fracture, subluxation, or dislocation. There is mild 3 compartmental osteoarthritis. No joint effusion. Mild subcutaneous edema. Right tibia/fibula: Frontal and lateral views are obtained. There are no acute fractures. There  are well-circumscribed lytic lesions within the proximal left tibia, largest in the posterior cortex of the proximal diaphysis measuring up to 1.5 cm. Similar lesion is seen within the posterior cortex of the proximal fibular diaphysis measuring up to 1.4 cm. Findings are consistent with known history of multiple myeloma. There are no acute displaced fractures. Mild osteoarthritis of the right knee and ankle. Soft tissues are unremarkable. IMPRESSION: 1. Circumscribed lytic lesions within the proximal tibia and fibula, compatible with known history of multiple lumbar. 2. Mild right knee and right ankle osteoarthritis. 3. No acute bony abnormality. Electronically Signed   By: Sharlet Salina M.D.   On: 10/23/2022 16:24   IR REMOVAL TUN ACCESS W/ PORT W/O FL MOD SED  Result Date: 10/21/2022 INDICATION: Bacteremia EXAM: REMOVAL RIGHT IJ VEIN PORT-A-CATH MEDICATIONS: None ANESTHESIA/SEDATION: Moderate (conscious) sedation was employed during this procedure. A total of Versed 0.5 mg and Fentanyl 25 mcg was administered intravenously by the radiology nurse. Total intra-service moderate Sedation Time: 11 minutes. The patient's level of consciousness and vital signs were monitored continuously by radiology nursing throughout the procedure under my direct supervision. FLUOROSCOPY: None COMPLICATIONS: None immediate. PROCEDURE: Informed written consent was obtained from the patient after a thorough discussion of the procedural risks, benefits and alternatives. All questions were addressed. Maximal Sterile Barrier Technique was utilized  including caps, mask, sterile gowns, sterile gloves, sterile drape, hand hygiene and skin antiseptic. A timeout was performed prior to the initiation of the procedure. Right anterior upper chest prepped and draped in the usual sterile fashion. Following local lidocaine administration, an incision was made at the superior margin of the port pocket. The pocket was dissected, and the port and catheter were removed in their entirety. The incision was closed with deep and superficial absorbable suture and sealed with Dermabond. The catheter tip was sent for Gram stain and culture. IMPRESSION: Successful right IJ vein Port-A-Cath explant. Electronically Signed   By: Acquanetta Belling M.D.   On: 10/21/2022 15:50   DG Chest 1 View  Result Date: 10/18/2022 CLINICAL DATA:  CHF. EXAM: CHEST  1 VIEW COMPARISON:  10/17/2022 FINDINGS: The cardio pericardial silhouette is enlarged. Vascular congestion with diffuse interstitial opacity suggesting edema. There is left base collapse/consolidation with small bilateral pleural effusions. Right Port-A-Cath again noted. Telemetry leads overlie the chest. IMPRESSION: 1. Vascular congestion with diffuse interstitial opacity suggesting edema. 2. Left base collapse/consolidation with small bilateral pleural effusions. Electronically Signed   By: Kennith Center M.D.   On: 10/18/2022 07:07   DG Forearm Right  Result Date: 10/17/2022 CLINICAL DATA:  Sepsis.  Laceration.  History of myeloma. EXAM: RIGHT FOREARM - 2 VIEW COMPARISON:  None Available. FINDINGS: No acute fracture or dislocation. Preserved adjacent joint spaces. Vascular calcifications. There are subcortical lucencies along the midshaft of the radius and ulna consistent with the patient's history of multiple myeloma. IMPRESSION: No acute osseous abnormality. Presumed myelomatous lesions involving the midshaft of the radius and ulna. Electronically Signed   By: Karen Kays M.D.   On: 10/17/2022 13:14   DG Chest Port 1  View  Result Date: 10/17/2022 CLINICAL DATA:  Questionable sepsis EXAM: PORTABLE CHEST 1 VIEW COMPARISON:  X-ray 08/23/2022 FINDINGS: Enlarged heart. Tortuous and ectatic aorta with stable widening of the mediastinum. Small pleural effusions with some adjacent opacity, left-greater-than-right. Increasing on the left compared to previous. Is also increasing vascular congestion and interstitial thickening. Old left-sided rib fractures. Osteopenia. Overlapping cardiac leads. Stable right IJ chest port with tip at the  central SVC. IMPRESSION: Chest port. Enlarged heart with increasing vascular congestion and some interstitial changes. Increasing left retrocardiac opacity.  Persistent small effusions. Recommend follow-up Electronically Signed   By: Karen Kays M.D.   On: 10/17/2022 13:13   CT Head Wo Contrast  Result Date: 10/17/2022 CLINICAL DATA:  Provided history: Facial trauma, blunt. Head trauma. Multiple recent falls (on anticoagulation). Weakness. Additional history obtained from electronic MEDICAL RECORD NUMBERHistory of multiple myeloma. EXAM: CT HEAD WITHOUT CONTRAST TECHNIQUE: Contiguous axial images were obtained from the base of the skull through the vertex without intravenous contrast. RADIATION DOSE REDUCTION: This exam was performed according to the departmental dose-optimization program which includes automated exposure control, adjustment of the mA and/or kV according to patient size and/or use of iterative reconstruction technique. COMPARISON:  Brain MRI 09/06/2018.  CT angiogram head 11/29/2012. FINDINGS: Brain: Generalized cerebral atrophy. Patchy and ill-defined hypoattenuation within the cerebral white matter, nonspecific but compatible with mild chronic small vessel ischemic disease. There is no acute intracranial hemorrhage. No demarcated cortical infarct. No extra-axial fluid collection. No evidence of an intracranial mass. No midline shift. Vascular: No hyperdense vessel.  Atherosclerotic  calcifications. Skull: No calvarial fracture. Known previously treated lesion within the left occipital calvarium. Fairly numerous lucent lesions scattered elsewhere within the bilateral calvarium, which are new from the prior head CT of 11/29/2012. Additionally, there is a new 14 mm expansile/destructive osseous lesion (with soft tissue components) in the anterior aspect of the right zygomatic arch, encroaching upon the lateral aspect of the right maxillary sinus (series 3, image 9). Sinuses/Orbits: No orbital mass or acute orbital finding. Mild mucosal thickening, and small fluid level, within the right sphenoid sinus. Partial opacification of the right sphenoethmoidal recess. IMPRESSION: 1. No evidence of an acute intracranial abnormality. 2. Mild chronic small vessel ischemic changes within the cerebral white matter. 3. Generalized cerebral atrophy. 4. Known previously treated lesion within the left occipital calvarium. 5. Fairly numerous lucent lesions scattered elsewhere within the bilateral calvarium which are new from the prior head CT of 11/29/2012. Additionally, there is a new 14 mm expansile/destructive osseous lesion (with soft tissue components) in the anterior aspect of the right zygomatic arch, encroaching upon the lateral aspect of the right maxillary sinus. These new lesions likely reflect sequelae of multiple myeloma given the patient's history. 6. Right sphenoid sinusitis. Electronically Signed   By: Jackey Loge D.O.   On: 10/17/2022 13:07    ASSESSMENT: Multiple myeloma  PLAN:  Multiple myeloma: Please see note from September 17, 2020 for historic details of his myeloma and treatment.  Since reinitiating daratumumab, patient's M spike trended down to 3.6, but then increased back to 5.3.  He was switched to single agent Pomalyst and his M spike has ranged between 1.3 and 2.0 since March 20, 2022.  His most recent result is 1.4.  His IgG levels have ranged between 1700 and 2845 over the same  timeframe.  His most recent result was 2029.  Kappa free light chains have jumped up significantly 586.4.  Previously ranged between 76.7 and 153.9.  Hospice was previously discussed, which patient declined.  He expressed understanding that there are likely no other treatments available if Pomalyst were no longer working.  Continue to hold Pomalyst and Zometa until patient's performance status improves.  Return to clinic in 2 weeks for further evaluation, laboratory work, and reconsideration of treatment.  Appreciate clinical pharmacy input. Back/joint pain: Chronic and unchanged.  Patient has now completed XRT.  MRI results reviewed  independently consistent with progressive myeloma.  No pathologic fractures reported.  Continue current narcotics as prescribed.    Peripheral neuropathy: Chronic and aged.  Continue gabapentin as prescribed. Insomnia/anxiety: Chronic and unchanged.  Patient is now having his medications adjusted by primary care.   Anemia: Chronic and unchanged.  Patient's hemoglobin is 10.9 today. Hypocalcemia: Given patient's low albumin this corrects to near normal.  Hold Zometa today. Pulmonary embolism: Patient is now on Eliquis. Gram-negative cocci sepsis: Resolved.  Patient has now completed antibiotics.  Hold Pomalyst as above. Weakness and fatigue: Patient reports he has home health and home physical therapy in place.  Patient also confirms DNR/DNI.   Patient expressed understanding and was in agreement with this plan. He also understands that He can call clinic at any time with any questions, concerns, or complaints.    Jeralyn Ruths, MD   11/10/2022 12:22 PM

## 2022-11-11 ENCOUNTER — Other Ambulatory Visit: Payer: Self-pay | Admitting: Family Medicine

## 2022-11-11 LAB — IGG, IGA, IGM
IgA: 51 mg/dL — ABNORMAL LOW (ref 61–437)
IgG (Immunoglobin G), Serum: 3043 mg/dL — ABNORMAL HIGH (ref 603–1613)
IgM (Immunoglobulin M), Srm: 30 mg/dL (ref 15–143)

## 2022-11-11 LAB — KAPPA/LAMBDA LIGHT CHAINS
Kappa free light chain: 165.4 mg/L — ABNORMAL HIGH (ref 3.3–19.4)
Kappa, lambda light chain ratio: 22.97 — ABNORMAL HIGH (ref 0.26–1.65)
Lambda free light chains: 7.2 mg/L (ref 5.7–26.3)

## 2022-11-11 NOTE — Telephone Encounter (Signed)
Requested medication (s) are due for refill today: yes  Requested medication (s) are on the active medication list: yes  Last refill:  08/24/22 #30 1 RF  Future visit scheduled:no  Notes to clinic:  pt was prescribed this med at hospital discharge 2 month supply by hospitalist- was previously refused- please review.   Requested Prescriptions  Pending Prescriptions Disp Refills   metoprolol tartrate (LOPRESSOR) 25 MG tablet [Pharmacy Med Name: METOPROLOL TARTRATE 25MG  TABLET] 30 tablet 1    Sig: TAKE ONE HALF (1/2) TABLET (12.5 MG TOTAL) BY MOUTH TWO (TWO) TIMES DAILY.     Cardiovascular:  Beta Blockers Failed - 11/11/2022 10:05 AM      Failed - Last BP in normal range    BP Readings from Last 1 Encounters:  11/10/22 (!) 115/35         Failed - Valid encounter within last 6 months    Recent Outpatient Visits           1 year ago Dysuria   [facility] Tanya Nones, Priscille Heidelberg, MD   1 year ago Hypotension due to hypovolemia   [facility] Donita Brooks, MD   1 year ago Hypotension due to hypovolemia   [facility] Donita Brooks, MD   1 year ago Hypotension due to hypovolemia   [facility] Valentino Nose, NP   2 years ago Seasonal allergic rhinitis due to pollen   [facility] Valentino Nose, NP              Passed - Last Heart Rate in normal range    Pulse Readings from Last 1 Encounters:  11/10/22 93

## 2022-11-12 LAB — PROTEIN ELECTROPHORESIS, SERUM
A/G Ratio: 0.7 (ref 0.7–1.7)
Albumin ELP: 2.7 g/dL — ABNORMAL LOW (ref 2.9–4.4)
Alpha-1-Globulin: 0.3 g/dL (ref 0.0–0.4)
Alpha-2-Globulin: 0.6 g/dL (ref 0.4–1.0)
Beta Globulin: 0.7 g/dL (ref 0.7–1.3)
Gamma Globulin: 2.2 g/dL — ABNORMAL HIGH (ref 0.4–1.8)
Globulin, Total: 3.8 g/dL (ref 2.2–3.9)
M-Spike, %: 2.1 g/dL — ABNORMAL HIGH
Total Protein ELP: 6.5 g/dL (ref 6.0–8.5)

## 2022-11-13 ENCOUNTER — Other Ambulatory Visit: Payer: Self-pay

## 2022-11-13 ENCOUNTER — Telehealth: Payer: Self-pay

## 2022-11-13 ENCOUNTER — Other Ambulatory Visit: Payer: Self-pay | Admitting: Family Medicine

## 2022-11-13 DIAGNOSIS — I471 Supraventricular tachycardia, unspecified: Secondary | ICD-10-CM

## 2022-11-13 MED ORDER — METOPROLOL TARTRATE 25 MG PO TABS: 12.5000 mg | ORAL_TABLET | Freq: Two times a day (BID) | ORAL | 1 refills | Status: AC

## 2022-11-13 MED ORDER — PREDNISONE 20 MG PO TABS: ORAL_TABLET | ORAL | 0 refills | Status: DC

## 2022-11-13 MED ORDER — AZITHROMYCIN 250 MG PO TABS: ORAL_TABLET | ORAL | 0 refills | Status: DC

## 2022-11-13 MED ORDER — AMOXICILLIN-POT CLAVULANATE 875-125 MG PO TABS: 1.0000 | ORAL_TABLET | Freq: Two times a day (BID) | ORAL | 0 refills | Status: DC

## 2022-11-13 NOTE — Telephone Encounter (Signed)
[name], RN with [facility] has called and states pt is sick, audible wheezing and crackles in all four lobes, O2sat 90%, coughing up green mucous. [name] has encouraged the patient to go to the ER but the patient is refusing.  Would you be willing to send in an abx and steroid for the patient? Thank you.

## 2022-11-14 ENCOUNTER — Telehealth: Payer: Self-pay

## 2022-11-14 NOTE — Telephone Encounter (Signed)
Esther OT with [facility] called to inform pcp that pt's daughter declined visit today stating that pt was not feeling well.  Please call Darral Dash OT with [facility] with any questions at [phone]

## 2022-11-23 ENCOUNTER — Inpatient Hospital Stay
Admission: EM | Admit: 2022-11-23 | Discharge: 2022-11-27 | DRG: 177 | Disposition: A | Discharge status: Skilled Nursing Facility | Payer: Medicare Other | Attending: Internal Medicine | Admitting: Internal Medicine

## 2022-11-23 ENCOUNTER — Emergency Department: Payer: Medicare Other

## 2022-11-23 ENCOUNTER — Inpatient Hospital Stay: Payer: Medicare Other

## 2022-11-23 ENCOUNTER — Other Ambulatory Visit: Payer: Self-pay

## 2022-11-23 DIAGNOSIS — Z823 Family history of stroke: Secondary | ICD-10-CM

## 2022-11-23 DIAGNOSIS — Z8249 Family history of ischemic heart disease and other diseases of the circulatory system: Secondary | ICD-10-CM

## 2022-11-23 DIAGNOSIS — R2231 Localized swelling, mass and lump, right upper limb: Secondary | ICD-10-CM | POA: Diagnosis not present

## 2022-11-23 DIAGNOSIS — Z79899 Other long term (current) drug therapy: Secondary | ICD-10-CM | POA: Diagnosis not present

## 2022-11-23 DIAGNOSIS — M75111 Incomplete rotator cuff tear or rupture of right shoulder, not specified as traumatic: Secondary | ICD-10-CM | POA: Diagnosis not present

## 2022-11-23 DIAGNOSIS — Z66 Do not resuscitate: Secondary | ICD-10-CM | POA: Diagnosis present

## 2022-11-23 DIAGNOSIS — G893 Neoplasm related pain (acute) (chronic): Secondary | ICD-10-CM | POA: Diagnosis present

## 2022-11-23 DIAGNOSIS — D6859 Other primary thrombophilia: Secondary | ICD-10-CM | POA: Diagnosis present

## 2022-11-23 DIAGNOSIS — I10 Essential (primary) hypertension: Secondary | ICD-10-CM | POA: Diagnosis not present

## 2022-11-23 DIAGNOSIS — R000 Tachycardia, unspecified: Secondary | ICD-10-CM | POA: Diagnosis not present

## 2022-11-23 DIAGNOSIS — I480 Paroxysmal atrial fibrillation: Secondary | ICD-10-CM | POA: Diagnosis present

## 2022-11-23 DIAGNOSIS — M179 Osteoarthritis of knee, unspecified: Secondary | ICD-10-CM | POA: Diagnosis present

## 2022-11-23 DIAGNOSIS — J9601 Acute respiratory failure with hypoxia: Secondary | ICD-10-CM | POA: Diagnosis not present

## 2022-11-23 DIAGNOSIS — I5033 Acute on chronic diastolic (congestive) heart failure: Secondary | ICD-10-CM | POA: Diagnosis present

## 2022-11-23 DIAGNOSIS — J690 Pneumonitis due to inhalation of food and vomit: Secondary | ICD-10-CM | POA: Diagnosis not present

## 2022-11-23 DIAGNOSIS — Z515 Encounter for palliative care: Secondary | ICD-10-CM

## 2022-11-23 DIAGNOSIS — R410 Disorientation, unspecified: Secondary | ICD-10-CM | POA: Diagnosis not present

## 2022-11-23 DIAGNOSIS — M25411 Effusion, right shoulder: Secondary | ICD-10-CM | POA: Diagnosis present

## 2022-11-23 DIAGNOSIS — L600 Ingrowing nail: Secondary | ICD-10-CM | POA: Diagnosis present

## 2022-11-23 DIAGNOSIS — N390 Urinary tract infection, site not specified: Principal | ICD-10-CM

## 2022-11-23 DIAGNOSIS — E860 Dehydration: Secondary | ICD-10-CM | POA: Diagnosis not present

## 2022-11-23 DIAGNOSIS — I471 Supraventricular tachycardia, unspecified: Secondary | ICD-10-CM | POA: Diagnosis present

## 2022-11-23 DIAGNOSIS — Z1152 Encounter for screening for COVID-19: Secondary | ICD-10-CM | POA: Diagnosis not present

## 2022-11-23 DIAGNOSIS — R0902 Hypoxemia: Secondary | ICD-10-CM | POA: Diagnosis not present

## 2022-11-23 DIAGNOSIS — F1120 Opioid dependence, uncomplicated: Secondary | ICD-10-CM | POA: Diagnosis present

## 2022-11-23 DIAGNOSIS — Z7901 Long term (current) use of anticoagulants: Secondary | ICD-10-CM | POA: Diagnosis not present

## 2022-11-23 DIAGNOSIS — I6782 Cerebral ischemia: Secondary | ICD-10-CM | POA: Diagnosis not present

## 2022-11-23 DIAGNOSIS — Z743 Need for continuous supervision: Secondary | ICD-10-CM | POA: Diagnosis not present

## 2022-11-23 DIAGNOSIS — C9000 Multiple myeloma not having achieved remission: Secondary | ICD-10-CM | POA: Diagnosis not present

## 2022-11-23 DIAGNOSIS — R918 Other nonspecific abnormal finding of lung field: Secondary | ICD-10-CM | POA: Diagnosis not present

## 2022-11-23 DIAGNOSIS — E440 Moderate protein-calorie malnutrition: Secondary | ICD-10-CM | POA: Diagnosis present

## 2022-11-23 DIAGNOSIS — Z86711 Personal history of pulmonary embolism: Secondary | ICD-10-CM

## 2022-11-23 DIAGNOSIS — R531 Weakness: Secondary | ICD-10-CM

## 2022-11-23 DIAGNOSIS — E538 Deficiency of other specified B group vitamins: Secondary | ICD-10-CM | POA: Diagnosis not present

## 2022-11-23 DIAGNOSIS — E876 Hypokalemia: Secondary | ICD-10-CM | POA: Diagnosis present

## 2022-11-23 DIAGNOSIS — Z888 Allergy status to other drugs, medicaments and biological substances status: Secondary | ICD-10-CM

## 2022-11-23 DIAGNOSIS — L989 Disorder of the skin and subcutaneous tissue, unspecified: Secondary | ICD-10-CM

## 2022-11-23 DIAGNOSIS — I4891 Unspecified atrial fibrillation: Secondary | ICD-10-CM | POA: Diagnosis not present

## 2022-11-23 DIAGNOSIS — B351 Tinea unguium: Secondary | ICD-10-CM | POA: Diagnosis present

## 2022-11-23 DIAGNOSIS — Z833 Family history of diabetes mellitus: Secondary | ICD-10-CM

## 2022-11-23 DIAGNOSIS — R0989 Other specified symptoms and signs involving the circulatory and respiratory systems: Secondary | ICD-10-CM | POA: Diagnosis not present

## 2022-11-23 DIAGNOSIS — S2243XA Multiple fractures of ribs, bilateral, initial encounter for closed fracture: Secondary | ICD-10-CM | POA: Diagnosis not present

## 2022-11-23 DIAGNOSIS — G4700 Insomnia, unspecified: Secondary | ICD-10-CM | POA: Diagnosis not present

## 2022-11-23 DIAGNOSIS — Z885 Allergy status to narcotic agent status: Secondary | ICD-10-CM

## 2022-11-23 DIAGNOSIS — R079 Chest pain, unspecified: Secondary | ICD-10-CM | POA: Insufficient documentation

## 2022-11-23 DIAGNOSIS — K219 Gastro-esophageal reflux disease without esophagitis: Secondary | ICD-10-CM | POA: Diagnosis present

## 2022-11-23 DIAGNOSIS — R1312 Dysphagia, oropharyngeal phase: Secondary | ICD-10-CM | POA: Diagnosis not present

## 2022-11-23 DIAGNOSIS — E871 Hypo-osmolality and hyponatremia: Secondary | ICD-10-CM | POA: Diagnosis present

## 2022-11-23 DIAGNOSIS — I110 Hypertensive heart disease with heart failure: Secondary | ICD-10-CM | POA: Diagnosis present

## 2022-11-23 DIAGNOSIS — Z923 Personal history of irradiation: Secondary | ICD-10-CM

## 2022-11-23 DIAGNOSIS — G4709 Other insomnia: Secondary | ICD-10-CM | POA: Diagnosis not present

## 2022-11-23 DIAGNOSIS — J168 Pneumonia due to other specified infectious organisms: Secondary | ICD-10-CM | POA: Diagnosis not present

## 2022-11-23 DIAGNOSIS — N400 Enlarged prostate without lower urinary tract symptoms: Secondary | ICD-10-CM | POA: Diagnosis not present

## 2022-11-23 DIAGNOSIS — J90 Pleural effusion, not elsewhere classified: Secondary | ICD-10-CM | POA: Diagnosis not present

## 2022-11-23 DIAGNOSIS — Z6830 Body mass index (BMI) 30.0-30.9, adult: Secondary | ICD-10-CM | POA: Diagnosis not present

## 2022-11-23 DIAGNOSIS — I272 Pulmonary hypertension, unspecified: Secondary | ICD-10-CM | POA: Diagnosis present

## 2022-11-23 DIAGNOSIS — J189 Pneumonia, unspecified organism: Secondary | ICD-10-CM

## 2022-11-23 DIAGNOSIS — Z9221 Personal history of antineoplastic chemotherapy: Secondary | ICD-10-CM

## 2022-11-23 DIAGNOSIS — G629 Polyneuropathy, unspecified: Secondary | ICD-10-CM | POA: Diagnosis present

## 2022-11-23 DIAGNOSIS — C7951 Secondary malignant neoplasm of bone: Secondary | ICD-10-CM | POA: Diagnosis not present

## 2022-11-23 LAB — CBC
HCT: 39 % (ref 39.0–52.0)
Hemoglobin: 11.6 g/dL — ABNORMAL LOW (ref 13.0–17.0)
MCH: 30 pg (ref 26.0–34.0)
MCHC: 29.7 g/dL — ABNORMAL LOW (ref 30.0–36.0)
MCV: 100.8 fL — ABNORMAL HIGH (ref 80.0–100.0)
Platelets: 179 10*3/uL (ref 150–400)
RBC: 3.87 MIL/uL — ABNORMAL LOW (ref 4.22–5.81)
RDW: 16.8 % — ABNORMAL HIGH (ref 11.5–15.5)
WBC: 6.4 10*3/uL (ref 4.0–10.5)
nRBC: 0 % (ref 0.0–0.2)

## 2022-11-23 LAB — HEPATIC FUNCTION PANEL
ALT: 13 U/L (ref 0–44)
AST: 29 U/L (ref 15–41)
Albumin: 2.5 g/dL — ABNORMAL LOW (ref 3.5–5.0)
Alkaline Phosphatase: 17 U/L — ABNORMAL LOW (ref 38–126)
Bilirubin, Direct: 0.3 mg/dL — ABNORMAL HIGH (ref 0.0–0.2)
Indirect Bilirubin: 0.9 mg/dL (ref 0.3–0.9)
Total Bilirubin: 1.2 mg/dL (ref 0.3–1.2)
Total Protein: 7.8 g/dL (ref 6.5–8.1)

## 2022-11-23 LAB — TROPONIN I (HIGH SENSITIVITY)
Troponin I (High Sensitivity): 14 ng/L (ref <18)
Troponin I (High Sensitivity): 11 ng/L (ref <18)

## 2022-11-23 LAB — LACTIC ACID, PLASMA: Lactic Acid, Venous: 1.4 mmol/L (ref 0.5–1.9)

## 2022-11-23 LAB — BASIC METABOLIC PANEL
Anion gap: 6 (ref 5–15)
BUN: 17 mg/dL (ref 8–23)
CO2: 28 mmol/L (ref 22–32)
Calcium: 8.5 mg/dL — ABNORMAL LOW (ref 8.9–10.3)
Chloride: 100 mmol/L (ref 98–111)
Creatinine, Ser: 0.82 mg/dL (ref 0.61–1.24)
GFR, Estimated: >60 mL/min (ref >60)
Glucose, Bld: 99 mg/dL (ref 70–99)
Potassium: 4.2 mmol/L (ref 3.5–5.1)
Sodium: 134 mmol/L — ABNORMAL LOW (ref 135–145)

## 2022-11-23 LAB — URINALYSIS, ROUTINE W REFLEX MICROSCOPIC
Bacteria, UA: NONE SEEN
Bilirubin Urine: NEGATIVE
Glucose, UA: NEGATIVE mg/dL
Ketones, ur: NEGATIVE mg/dL
Nitrite: NEGATIVE
Protein, ur: NEGATIVE mg/dL
Specific Gravity, Urine: 1.01 (ref 1.005–1.030)
pH: 7 (ref 5.0–8.0)

## 2022-11-23 LAB — RESP PANEL BY RT-PCR (RSV, FLU A&B, COVID)  RVPGX2
Influenza A by PCR: NEGATIVE
Influenza B by PCR: NEGATIVE
Resp Syncytial Virus by PCR: NEGATIVE
SARS Coronavirus 2 by RT PCR: NEGATIVE

## 2022-11-23 LAB — PROCALCITONIN: Procalcitonin: <0.1 ng/mL

## 2022-11-23 MED ORDER — ACETAMINOPHEN 325 MG PO TABS
650.0000 mg | ORAL_TABLET | Freq: Four times a day (QID) | ORAL | Status: DC | PRN
  Administered 2022-11-24 – 2022-11-26 (×2): 650 mg via ORAL
  Filled 2022-11-23 (×2): qty 2

## 2022-11-23 MED ORDER — LACTATED RINGERS IV BOLUS
1000.0000 mL | Freq: Once | INTRAVENOUS | Status: AC
  Administered 2022-11-23: 1000 mL via INTRAVENOUS

## 2022-11-23 MED ORDER — APIXABAN 5 MG PO TABS
5.0000 mg | ORAL_TABLET | Freq: Two times a day (BID) | ORAL | Status: DC
  Administered 2022-11-23 – 2022-11-27 (×8): 5 mg via ORAL
  Filled 2022-11-23 (×8): qty 1

## 2022-11-23 MED ORDER — SODIUM CHLORIDE 0.9 % IV SOLN
500.0000 mg | INTRAVENOUS | Status: DC
  Administered 2022-11-24: 500 mg via INTRAVENOUS
  Filled 2022-11-23: qty 5

## 2022-11-23 MED ORDER — SODIUM CHLORIDE 0.9 % IV SOLN
2.0000 g | INTRAVENOUS | Status: DC
  Administered 2022-11-24 – 2022-11-26 (×3): 2 g via INTRAVENOUS
  Filled 2022-11-23 (×3): qty 20

## 2022-11-23 MED ORDER — TAMSULOSIN HCL 0.4 MG PO CAPS
0.4000 mg | ORAL_CAPSULE | Freq: Every day | ORAL | Status: DC
  Administered 2022-11-24 – 2022-11-25 (×2): 0.4 mg via ORAL
  Filled 2022-11-23 (×2): qty 1

## 2022-11-23 MED ORDER — AZITHROMYCIN 500 MG IV SOLR
500.0000 mg | Freq: Once | INTRAVENOUS | Status: AC
  Administered 2022-11-23: 500 mg via INTRAVENOUS
  Filled 2022-11-23: qty 5

## 2022-11-23 MED ORDER — TRAZODONE HCL 100 MG PO TABS: 100.0000 mg | ORAL_TABLET | Freq: Every evening | ORAL | Status: AC | PRN

## 2022-11-23 MED ORDER — ONDANSETRON HCL 4 MG/2ML IJ SOLN: 4.0000 mg | Freq: Four times a day (QID) | INTRAMUSCULAR | Status: DC | PRN

## 2022-11-23 MED ORDER — FERROUS SULFATE 325 (65 FE) MG PO TABS
325.0000 mg | ORAL_TABLET | Freq: Every day | ORAL | Status: DC
  Administered 2022-11-24 – 2022-11-27 (×4): 325 mg via ORAL
  Filled 2022-11-23 (×3): qty 1

## 2022-11-23 MED ORDER — MELATONIN 5 MG PO TABS
10.0000 mg | ORAL_TABLET | Freq: Every day | ORAL | Status: DC
  Administered 2022-11-23 – 2022-11-26 (×4): 10 mg via ORAL
  Filled 2022-11-23 (×4): qty 2

## 2022-11-23 MED ORDER — SENNOSIDES-DOCUSATE SODIUM 8.6-50 MG PO TABS: 1.0000 | ORAL_TABLET | Freq: Every evening | ORAL | Status: DC | PRN

## 2022-11-23 MED ORDER — FOLIC ACID 1 MG PO TABS
1.0000 mg | ORAL_TABLET | Freq: Every day | ORAL | Status: DC
  Administered 2022-11-24 – 2022-11-27 (×4): 1 mg via ORAL
  Filled 2022-11-23 (×4): qty 1

## 2022-11-23 MED ORDER — GABAPENTIN 400 MG PO CAPS
400.0000 mg | ORAL_CAPSULE | Freq: Two times a day (BID) | ORAL | Status: DC
  Administered 2022-11-23 – 2022-11-27 (×8): 400 mg via ORAL
  Filled 2022-11-23 (×8): qty 1

## 2022-11-23 MED ORDER — ACETAMINOPHEN 650 MG RE SUPP: 650.0000 mg | Freq: Four times a day (QID) | RECTAL | Status: DC | PRN

## 2022-11-23 MED ORDER — GUAIFENESIN-DM 100-10 MG/5ML PO SYRP
5.0000 mL | ORAL_SOLUTION | ORAL | Status: DC | PRN
  Administered 2022-11-23 – 2022-11-26 (×4): 5 mL via ORAL
  Filled 2022-11-23 (×4): qty 10

## 2022-11-23 MED ORDER — OXYCODONE HCL 5 MG PO TABS
10.0000 mg | ORAL_TABLET | Freq: Three times a day (TID) | ORAL | Status: DC | PRN
  Filled 2022-11-23: qty 2

## 2022-11-23 MED ORDER — ONDANSETRON HCL 4 MG PO TABS: 4.0000 mg | ORAL_TABLET | Freq: Four times a day (QID) | ORAL | Status: DC | PRN

## 2022-11-23 MED ORDER — MONTELUKAST SODIUM 10 MG PO TABS
10.0000 mg | ORAL_TABLET | Freq: Every day | ORAL | Status: DC
  Administered 2022-11-23 – 2022-11-26 (×4): 10 mg via ORAL
  Filled 2022-11-23 (×4): qty 1

## 2022-11-23 MED ORDER — CEFTRIAXONE SODIUM 1 G IJ SOLR
1.0000 g | Freq: Once | INTRAMUSCULAR | Status: AC
  Administered 2022-11-23: 1 g via INTRAVENOUS
  Filled 2022-11-23: qty 10

## 2022-11-23 MED ORDER — SODIUM CHLORIDE 0.9 % IV BOLUS
2000.0000 mL | Freq: Once | INTRAVENOUS | Status: AC
  Administered 2022-11-23: 2000 mL via INTRAVENOUS

## 2022-11-23 MED ORDER — METRONIDAZOLE 500 MG/100ML IV SOLN
500.0000 mg | Freq: Two times a day (BID) | INTRAVENOUS | Status: DC
  Administered 2022-11-23 – 2022-11-26 (×6): 500 mg via INTRAVENOUS
  Filled 2022-11-23 (×7): qty 100

## 2022-11-23 MED ORDER — GADOBUTROL 1 MMOL/ML IV SOLN
9.0000 mL | Freq: Once | INTRAVENOUS | Status: AC | PRN
  Administered 2022-11-23: 9 mL via INTRAVENOUS

## 2022-11-23 NOTE — Assessment & Plan Note (Addendum)
Troponins negative

## 2022-11-23 NOTE — ED Notes (Signed)
Taken off bedpan

## 2022-11-23 NOTE — ED Notes (Signed)
This RN and Janeeta NT at bedside to clean pt. Pt cleaned and dried at this time. In and out cath brought to the room, per family at bedside last time he needed a catheter pt experienced extensive trauma resulting in an extended hospital stay. Reports that this RN will start External purewick placed on pt at this time.

## 2022-11-23 NOTE — Hospital Course (Addendum)
Mr. Gerado Companion Junior is a 87 year old male with history of multiple myeloma, end-stage, hypertension, BPH, insomnia, paroxysmal atrial fibrillation, history of PE, on Eliquis, who presents to the emergency department for chief concerns of weakness for 1 week. He had a significant cough and shortness of breath.  Hypoxemia, was placed on 2 L oxygen.  Patient was also placed on antibiotics Rocephin and Zithromax for possible pneumonia.  10/23.  Patient on room air.  Not sure if he has pneumonia with negative procalcitonin change antibiotics over to oral for tomorrow.  TOC looking into rehab beds. 10/24.  Patient did not sleep very well, states he need the melatonin, 300mg  trazadone and 1mg  of xanax too sleep.  Feels tired this am.

## 2022-11-23 NOTE — ED Triage Notes (Signed)
Pt in via EMS from home with c/o weakness. Pt is a cancer pt and lives with his daughter. Pt has been sick and taking a x-pack for the last week but is not feeling better. Pt has strong smell of urine per EMS. EMS reports pt is AMS and weak. 106/70, 88% RA, placed on 2L and now 97%, RR 24, HR 100, irregular. Pt gets cancer treatment here but has stopped chemo and radiating because he is end stage.

## 2022-11-23 NOTE — Assessment & Plan Note (Addendum)
PDMP reviewed Home oxycodone 10 mg 3 times daily not resumed on admission due to patient presenting with lethargy and weakness Oxycodone 10 mg 3 times daily as needed for severe pain, breakthrough pain ordered for 1 day AM team to resume home pain medications as appropriate Gabapentin 400 mg p.o. twice daily resumed Aspiration precautions ordered

## 2022-11-23 NOTE — Assessment & Plan Note (Addendum)
Trazodone and melatonin

## 2022-11-23 NOTE — ED Notes (Signed)
Patient placed on bedpan with no results

## 2022-11-23 NOTE — Assessment & Plan Note (Addendum)
Multiple myeloma of the shoulder.

## 2022-11-23 NOTE — ED Notes (Signed)
Family requested patient to not be in and out for urine. External catheter placed on patient at this time by [facility]

## 2022-11-23 NOTE — Assessment & Plan Note (Addendum)
With end-stage multiple myeloma Increased hypercoagulable state Home Eliquis 5 mg p.o. twice daily resumed

## 2022-11-23 NOTE — ED Provider Notes (Signed)
[facility] Provider Note    Event Date/Time   First MD Initiated Contact with Patient 11/23/22 1006     (approximate)   History   Weakness   HPI  Dean [name]. is a 87 y.o. male who presents to the urgency department today because of concerns for weakness.  Patient is unable to give any significant history.  History is primarily obtained from daughter at bedside.  She states for the past week or so the patient has been dealing with congestion.  He did have antibiotics called in by his doctor for azithromycin and now currently on Augmentin.  However he has continued to get weak.  This morning the weakness was the worst it has been.  Daughter also has concerns for possible urinary tract infection given that the urine has been dark recently.  Patient has a history of multiple myeloma although currently is not getting treatment because he has been too weak.     Physical Exam   Triage Vital Signs: ED Triage Vitals  Encounter Vitals Group     BP 11/23/22 0823 138/64     Systolic BP Percentile --      Diastolic BP Percentile --      Pulse Rate 11/23/22 0823 98     Resp 11/23/22 0823 (!) 25     Temp 11/23/22 0823 98.1 F (36.7 C)     Temp src --      SpO2 11/23/22 0823 98 %     Weight --      Height --      Head Circumference --      Peak Flow --      Pain Score 11/23/22 0822 10     Pain Loc --      Pain Education --      Exclude from Growth Chart --     Most recent vital signs: Vitals:   11/23/22 0823 11/23/22 1006  BP: 138/64 (!) 113/57  Pulse: 98 95  Resp: (!) 25 14  Temp: 98.1 F (36.7 C)   SpO2: 98% 100%   General: Somnolent awakens slightly to verbal stimuli CV:  Good peripheral perfusion. Regular rate and rhythm. Resp:  Normal effort. Lungs clear. Abd:  No distention. Non tender.   ED Results / Procedures / Treatments   Labs (all labs ordered are listed, but only abnormal results are displayed) Labs Reviewed  BASIC  METABOLIC PANEL - Abnormal; Notable for the following components:      Result Value   Sodium 134 (*)    Calcium 8.5 (*)    All other components within normal limits  CBC - Abnormal; Notable for the following components:   RBC 3.87 (*)    Hemoglobin 11.6 (*)    MCV 100.8 (*)    MCHC 29.7 (*)    RDW 16.8 (*)    All other components within normal limits  CULTURE, BLOOD (ROUTINE X 2)  CULTURE, BLOOD (ROUTINE X 2)  LACTIC ACID, PLASMA  URINALYSIS, ROUTINE W REFLEX MICROSCOPIC  LACTIC ACID, PLASMA  CBG MONITORING, ED     EKG  I, [name], attending physician, personally viewed and interpreted this EKG  EKG Time: 0826 Rate: 116 Rhythm: atrial fibrillation Axis: normal Intervals: qtc 472 QRS: narrow ST changes: no st elevation Impression: abnormal ekg   RADIOLOGY I independently interpreted and visualized the CT head. My interpretation: No bleed Radiology interpretation:  IMPRESSION:  1. No acute intracranial abnormalities.  2. Moderate cerebral  and cerebellar atrophy with chronic  microvascular ischemic changes in the cerebral white matter, as  above.  3. Numerous osseous lesions again noted in the skull compatible with  reported clinical history of multiple myeloma.   I independently interpreted and visualized the CXR. My interpretation: left lower lobe pneumonia Radiology interpretation: IMPRESSION:  1. New left retrocardiac opacity, may be due to atelectasis,  infection or aspiration.  2. Small bilateral pleural effusions.    PROCEDURES:  Critical Care performed: Yes  CRITICAL CARE Performed by: [name]   Total critical care time: 30 minutes  Critical care time was exclusive of separately billable procedures and treating other patients.  Critical care was necessary to treat or prevent imminent or life-threatening deterioration.  Critical care was time spent personally by me on the following activities: development of treatment plan with  patient and/or surrogate as well as nursing, discussions with consultants, evaluation of patient's response to treatment, examination of patient, obtaining history from patient or surrogate, ordering and performing treatments and interventions, ordering and review of laboratory studies, ordering and review of radiographic studies, pulse oximetry and re-evaluation of patient's condition.   Procedures    MEDICATIONS ORDERED IN ED: Medications - No data to display   IMPRESSION / MDM / ASSESSMENT AND PLAN / ED COURSE  I reviewed the triage vital signs and the nursing notes.                              Differential diagnosis includes, but is not limited to, pneumonia, viral URI, UTI, anemia, electrolyte abnormality  Patient's presentation is most consistent with acute presentation with potential threat to life or bodily function.   The patient is on the cardiac monitor to evaluate for evidence of arrhythmia and/or significant heart rate changes.  Patient presented to the emergency department today because of concerns for significant weakness.  On exam patient was very somnolent.  Did have concern for infection given reported history of congestion and change in urine.  Chest x-ray is concerning for pneumonia here and UA is concerning for UTI.  Patient was started on broad-spectrum antibiotics.  I discussed findings with family.  Patient did seem to become more awake after IV fluids.  Will plan on admission.  Discussed with Dr. Sedalia Muta with the hospitalist service who will evaluate for admission.      FINAL CLINICAL IMPRESSION(S) / ED DIAGNOSES   Final diagnoses:  Lower urinary tract infectious disease  Pneumonia due to infectious organism, unspecified laterality, unspecified part of lung  Weakness  Dehydration     Note:  This document was prepared using Dragon voice recognition software and may include unintentional dictation errors.    [name], MD 11/23/22 [phone]

## 2022-11-23 NOTE — H&P (Addendum)
History and Physical   Dean [name]. ZOX:[id number] DOB: [birth year] DOA: 11/23/2022  PCP: Dean Brooks, MD  Outpatient Specialists: Dr. Orlie [name], medical oncology Patient coming from: Home via EMS  I have personally briefly reviewed patient's old medical records in [facility] EMR.  Chief Concern: Weakness, acute hypoxic respiratory failure  HPI: [name] is a 87 year old male with history of multiple myeloma, end-stage, hypertension, BPH, insomnia, paroxysmal atrial fibrillation, history of PE, on Eliquis, who presents to the emergency department for chief concerns of weakness for 1 week.  Per EDP, patient was clinically diagnosed outpatient for pneumonia and has completed a Z-Pak.  Vitals in the ED showed temperature of 98.1, respiration rate of 25, heart rate of 98, blood pressure 138/65, SpO2 of 98% on 3 L nasal cannula.  Serum sodium is 134, potassium 4.2, chloride 100, bicarb 26, BUN of 17, serum creatinine 0.82, EGFR greater than 60, nonfasting blood glucose 99, WBC 6.4, hemoglobin 11.6, platelets of 179.  Lactic acid is 1.4.  UA was positive for large leukocytes and small hemoglobin. Negative for nitrites.    EKG was positive for atrial fibrillation with RVR, rate of 116, QTc 472.  CT head wo contrast: Read as no acute intracranial abnormalities.  Moderate cerebral and cerebellar atrophy with chronic microvascular ischemic changes in the cerebral white matter.  Numerous osseous lesions again noted in the skull compatible with history of multiple myeloma.  ED treatment: Ceftriaxone 1 g IV, azithromycin 500 mg IV, LR 1 L bolus. ---------------------------------- At bedside, patient was able to tell me his name, his age, the current day is the 20th and he knows he is in the hospital.  Patient appears weak and lethargic at bedside.  Patient is arousable and then falls back asleep.  Daughter reports that over the last week, he has become increasingly weak  especially over the last 2 days.  Daughter endorses constipation, last bowel movement was 2 days ago and was very hard with small amount streaks of blood.  She denies nausea, vomiting, fever, syncope at home.  She reports that he did not want to come to the hospital today when EMS came.  Social history: Patient staying with his daughter and son-in-law.  Patient is retired.  ROS: Unable to complete due to acuity of presentation  ED Course: Discussed with emergency medicine provider, patient requiring hospitalization for chief concerns of weakness, acute hypoxic respiratory failure.  Assessment/Plan  Principal Problem:   Acute hypoxemic respiratory failure (HCC) Active Problems:   Uncomplicated opioid dependence (HCC)   Insomnia   OA (osteoarthritis) of knee   SVT (supraventricular tachycardia) (HCC)   Paroxysmal A-fib (HCC)   Mass of skin of right shoulder   Skin lesion of foot   Chest pain   Assessment and Plan:  * Acute hypoxemic respiratory failure (HCC) Etiology workup in progress Check procalcitonin on admission Check LFTs, high sensitive troponin Blood cultures x 2 are in process Continue with ceftriaxone 2 g IV daily, azithromycin 500 mg IV daily to complete 5-day course Fall and aspiration precaution Admit to telemetry cardiac, inpatient  Uncomplicated opioid dependence (HCC) PDMP reviewed Home oxycodone 10 mg 3 times daily not resumed on admission due to patient presenting with lethargy and weakness Oxycodone 10 mg 3 times daily as needed for severe pain, breakthrough pain ordered for 1 day AM team to resume home pain medications as appropriate Gabapentin 400 mg p.o. twice daily resumed Aspiration precautions ordered  Chest pain Daughter reports that  patient has been endorsing chest pain with radiation to his right upper extremities over the past few days High sensitive troponin ordered 02/11/2022: Complete echo was read as estimated ejection fraction of 60 to  65%, grade 2 diastolic dysfunction  Skin lesion of foot At the left lateral malleolus, per daughter this has been ongoing for a while  Appears to be nonhealing, with surrounding erythema, possible subacute cellulitis cannot be excluded Continue IV antibiotics per above Negative for purulence and drainage Continue antibiotic per above   Mass of skin of right shoulder Approximately 2-3 cm x 8 cm in size, firm at the posterior right shoulder, POA. Daughter does not know how long it has been there. Daughter states PT discovered it Monday, 11/17/22 Right shoulder with and without contrast MRI ordered Suspect secondary to multiple myeloma deposit  Paroxysmal A-fib (HCC) With end-stage multiple myeloma Increased hypercoagulable state Home Eliquis 5 mg p.o. twice daily resumed  Insomnia Home trazodone 100 to 300 mg nightly not resumed on admission due to patient presenting with lethargy and weakness Melatonin 10 mg nightly resumed Trazodone 100 mg nightly as needed for sleep ordered AM team to resume home trazodone dosing when the benefits outweigh the risk  Right first toe ingrown toenail with likely onychomycosis-outpatient follow-up with podiatry can be beneficial  Chart reviewed.   DVT prophylaxis: Eliquis 500 mg p.o. twice daily resumed Code Status: DNR/DNI, confirmed with daughter at bedside and MOST form in ACP reviewed Diet: N.p.o. pending SLP evaluation due to patient with increased lethargy and weakness Family Communication: Dean [name], daughter at bedside. Disposition Plan: Pending clinical course, guarded prognosis Consults called: None at this time Admission status: Telemetry cardiac, inpatient  Past Medical History:  Diagnosis Date   BPH (benign prostatic hyperplasia)    Colonic diverticular abscess 01/08/2015   Diverticulitis 12/22/2014   complicated by abscess and required percutaneous drainage   GERD (gastroesophageal reflux disease)    H/O ETOH abuse     History of chemotherapy last done jan 2017   History of radiation therapy 01/05/13-02/10/13   45 gray to left occipital condyle region   History of radiation therapy 12/01/16-12/10/16   Parasternal nodule, chest- 24 Gy total delivered in 8 fractions, Left sacro-iliac, pelvis- 24 Gy total delivered in 8 fractions    History of radiation therapy 02/19/17-02/22/17   right temporal scalp 30 Gy in 10 fractions   History of radiation therapy    Right hip( Pelvis) 11/18/21-11/21/21-Dr. Antony Blackbird   Intra-abdominal abscess (HCC)    Multiple myeloma (HCC) 2015   Neuropathy    Radiation 02/21/14-03/08/14   right posterior chest wall area 30 gray   Radiation 04/19/14-05/02/14   lumbar spine 25 gray   Skull lesion    Left occipital condyle   Wrist fracture, left    x 2   Past Surgical History:  Procedure Laterality Date   COLONOSCOPY N/A 04/19/2015   Procedure: COLONOSCOPY;  Surgeon: West Bali, MD;  Location: [facility];  Service: Endoscopy;  Laterality: N/A;  1:30 PM   CYST EXCISION  1959   tail bone   CYSTOSCOPY WITH FULGERATION N/A 08/20/2022   Procedure: CYSTOSCOPY WITH FULGERATION;  Surgeon: Crista Elliot, MD;  Location: [facility] ORS;  Service: Urology;  Laterality: N/A;  cysto clot evac   IR IMAGING GUIDED PORT INSERTION  09/25/2017   IR REMOVAL TUN ACCESS W/ PORT W/O FL MOD SED  10/21/2022   Social History:  reports that he has never smoked. He has  never used smokeless tobacco. He reports that he does not drink alcohol and does not use drugs.  Allergies  Allergen Reactions   Codeine Other (See Comments)    Headache   Morphine And Codeine Other (See Comments)    Makes him feel weird   Revlimid [Lenalidomide] Other (See Comments)    "Causes me to become weak"   Family History  Problem Relation Age of Onset   Diabetes Mother    Stroke Mother    Hypertension Father    Family history: Family history reviewed and not pertinent.  Prior to Admission medications   Medication Sig  Start Date End Date Taking? Authorizing Provider  acetaminophen (TYLENOL) 500 MG tablet Take 500-1,000 mg by mouth every 6 (six) hours as needed for mild pain or moderate pain.    [provider]  amoxicillin-clavulanate (AUGMENTIN) 875-125 MG tablet Take 1 tablet by mouth 2 (two) times daily. 11/13/22   Dean Brooks, MD  azithromycin (ZITHROMAX) 250 MG tablet 2 tabs poqday1, 1 tab poqday 2-5 11/13/22   Dean Brooks, MD  Calcium Carbonate (CALCIUM 500 PO) Take 750 mg by mouth daily.    [provider]  cyanocobalamin (VITAMIN B12) 500 MCG tablet Take 1 tablet (500 mcg total) by mouth daily. 02/13/22   Catarina Hartshorn, MD  ELIQUIS 5 MG TABS tablet TAKE ONE TABLET BY MOUTH TWICE DAILY 10/28/22   Jeralyn Ruths, MD  ferrous sulfate 325 (65 FE) MG tablet Take 325 mg by mouth daily.    [provider]  folic acid (FOLVITE) 1 MG tablet TAKE ONE TABLET (1 MG TOTAL) BY MOUTH DAILY. 10/28/22   Jeralyn Ruths, MD  furosemide (LASIX) 20 MG tablet Take 1 tablet (20 mg total) by mouth daily as needed. Patient taking differently: Take 20 mg by mouth every other day. 04/22/22   Jeralyn Ruths, MD  gabapentin (NEURONTIN) 400 MG capsule Take 1 capsule (400 mg total) by mouth 3 (three) times daily. Patient taking differently: Take 400 mg by mouth 2 (two) times daily. 04/21/22   Jeralyn Ruths, MD  guaiFENesin-dextromethorphan (ROBITUSSIN DM) 100-10 MG/5ML syrup Take 5 mLs by mouth every 4 (four) hours as needed for cough (chest congestion). 08/24/22   Meredeth Ide, MD  lidocaine-prilocaine (EMLA) cream Apply small amount over port one (1) hour prior to appointment. 09/17/19   Doreatha Massed, MD  Melatonin 10 MG TABS Take 10 mg by mouth at bedtime.    [provider]  metoprolol tartrate (LOPRESSOR) 25 MG tablet Take 0.5 tablets (12.5 mg total) by mouth 2 (two) times daily. 11/13/22 12/13/22  Dean Brooks, MD  montelukast (SINGULAIR) 10 MG tablet Take 1  tablet (10 mg total) by mouth at bedtime. 09/30/22   Dean Brooks, MD  Oxycodone HCl 10 MG TABS Take 1 tablet (10 mg total) by mouth 3 (three) times daily as needed. 10/28/22   Jeralyn Ruths, MD  pomalidomide (POMALYST) 3 MG capsule Take 1 capsule (3 mg total) by mouth daily. Take for 21 days, then hold for 7 days. Repeat every 28 days. 09/29/22   Jeralyn Ruths, MD  predniSONE (DELTASONE) 20 MG tablet 3 tabs poqday 1-2, 2 tabs poqday 3-4, 1 tab poqday 5-6 11/13/22   Dean Brooks, MD  tamsulosin (FLOMAX) 0.4 MG CAPS capsule TAKE ONE CAPSULE BY MOUTH EVERY NIGHT ATBEDTIME 08/29/22   Dean Brooks, MD  traZODone (DESYREL) 100 MG tablet TAKE THREE TABLETS BY MOUTH EVERY NIGHT  AT BEDTIME Patient taking differently: Take 100-300 mg by mouth at bedtime. 07/28/22   Borders, Daryl Eastern, NP  TURMERIC PO Take 1 tablet by mouth 2 (two) times daily.    [provider]   Physical Exam: Vitals:   11/23/22 1215 11/23/22 1230 11/23/22 1253 11/23/22 1300  BP:  (!) 117/58  126/88  Pulse: 92 97  92  Resp: 13 14  16   Temp:      TempSrc:      SpO2: 99% 100% 100% 100%   Constitutional: appears, weak, lethargic Eyes: PERRL, lids and conjunctivae normal ENMT: Mucous membranes are dry.  Unable to visualize posterior pharynx. Age-appropriate dentition. Hearing appropriate. Neck: normal, supple, no masses, no thyromegaly Respiratory: clear to auscultation bilaterally, no wheezing, no crackles. Normal respiratory effort. No accessory muscle use.  Cardiovascular: Regular rate and rhythm, no murmurs / rubs / gallops. Bilateral ankle 3+ pitting edema. 2+ pedal pulses. No carotid bruits.  Abdomen: no tenderness, no masses palpated, no hepatosplenomegaly. Bowel sounds positive.  Musculoskeletal: no clubbing / cyanosis. No joint deformity upper and lower extremities.  Diffuse and generalized weakness of the extremities.  No contractures atrophy.  Unable to assess muscle tone.  Skin: Right first  toe suspected ingrown toenail.  Left lateral ankle lesion present on admission     Neurologic: Sensation intact. Strength 5/5 in all 4.  Psychiatric: Unable to assess judgment, insight, mood due to patient lethargy. Alert and oriented x 3.   EKG: independently reviewed, showing atrial fibrillation with RVR rate of 116, QTc 472  Chest x-ray on Admission: I personally reviewed and I agree with radiologist reading as below.  DG Chest Portable 1 View  Result Date: 11/23/2022 CLINICAL DATA:  Congestion and weakness EXAM: PORTABLE CHEST 1 VIEW COMPARISON:  Chest x-ray dated October 18, 2022 FINDINGS: Cardiac and mediastinal contours are unchanged. New left retrocardiac opacity. Small bilateral pleural effusions. Unchanged linear opacity of the right upper lobe, likely due to scarring. Osseous heterogeneity, likely due to underlying metastatic disease. Chronic appearing bilateral rib fractures and old left clavicle fracture. IMPRESSION: 1. New left retrocardiac opacity, may be due to atelectasis, infection or aspiration. 2. Small bilateral pleural effusions. Electronically Signed   By: Allegra Lai M.D.   On: 11/23/2022 11:36   CT HEAD WO CONTRAST ( )  Result Date: 11/23/2022 CLINICAL DATA:  87 year old male with history of confusion and weakness. EXAM: CT HEAD WITHOUT CONTRAST TECHNIQUE: Contiguous axial images were obtained from the base of the skull through the vertex without intravenous contrast. RADIATION DOSE REDUCTION: This exam was performed according to the departmental dose-optimization program which includes automated exposure control, adjustment of the mA and/or kV according to patient size and/or use of iterative reconstruction technique. COMPARISON:  Head CT 10/16/2012. FINDINGS: Brain: Moderate cerebral and cerebellar atrophy. Patchy and confluent areas of decreased attenuation are noted throughout the deep and periventricular white matter of the cerebral hemispheres bilaterally,  compatible with chronic microvascular ischemic disease. No evidence of acute infarction, hemorrhage, hydrocephalus, extra-axial collection or mass lesion/mass effect. Vascular: No hyperdense vessel or unexpected calcification. Skull: Numerous well-circumscribed lytic lesions scattered throughout the skull, similar to the prior study. Sinuses/Orbits: No acute finding. Other: None. IMPRESSION: 1. No acute intracranial abnormalities. 2. Moderate cerebral and cerebellar atrophy with chronic microvascular ischemic changes in the cerebral white matter, as above. 3. Numerous osseous lesions again noted in the skull compatible with reported clinical history of multiple myeloma. Electronically Signed   By: Brayton Mars.D.  On: 11/23/2022 08:53    Labs on Admission: I have personally reviewed following labs  CBC: Recent Labs  Lab 11/23/22 0825  WBC 6.4  HGB 11.6*  HCT 39.0  MCV 100.8*  PLT 179   Basic Metabolic Panel: Recent Labs  Lab 11/23/22 0825  NA 134*  K 4.2  CL 100  CO2 28  GLUCOSE 99  BUN 17  CREATININE 0.82  CALCIUM 8.5*   GFR: Estimated Creatinine Clearance: 72.3 mL/min (by C-G formula based on SCr of 0.82 mg/dL).  Urine analysis:    Component Value Date/Time   COLORURINE YELLOW (A) 11/23/2022 1208   APPEARANCEUR HAZY (A) 11/23/2022 1208   LABSPEC 1.010 11/23/2022 1208   PHURINE 7.0 11/23/2022 1208   GLUCOSEU NEGATIVE 11/23/2022 1208   HGBUR SMALL (A) 11/23/2022 1208   BILIRUBINUR NEGATIVE 11/23/2022 1208   BILIRUBINUR negative 07/18/2021 0841   KETONESUR NEGATIVE 11/23/2022 1208   PROTEINUR NEGATIVE 11/23/2022 1208   UROBILINOGEN 0.2 07/18/2021 0841   UROBILINOGEN 0.2 01/02/2014 1103   NITRITE NEGATIVE 11/23/2022 1208   LEUKOCYTESUR LARGE (A) 11/23/2022 1208   CRITICAL CARE Performed by: Dr. Sedalia Muta  Total critical care time: 32 minutes  Critical care time was exclusive of separately billable procedures and treating other patients.  Critical care was  necessary to treat or prevent imminent or life-threatening deterioration.  Critical care was time spent personally by me on the following activities: development of treatment plan with patient and/or surrogate as well as nursing, discussions with consultants, evaluation of patient's response to treatment, examination of patient, obtaining history from patient or surrogate, ordering and performing treatments and interventions, ordering and review of laboratory studies, ordering and review of radiographic studies, pulse oximetry and re-evaluation of patient's condition.  This document was prepared using Dragon Voice Recognition software and may include unintentional dictation errors.  Dr. Sedalia Muta [facility]  If 7PM-7AM, please contact overnight-coverage provider If 7AM-7PM, please contact day attending provider [email or web address]  11/23/2022, 6:13 PM

## 2022-11-23 NOTE — Assessment & Plan Note (Addendum)
Resolved.  Patient on room air.  Not seeing a documentation of a low pulse ox even though he was on oxygen when he came in.

## 2022-11-23 NOTE — ED Triage Notes (Addendum)
Pt comes via EMS from home with c/o increased weakness. Pt states he just can't get up. Pt states pain across his chest and down his arm to his fingers. Pt denies any sob. Pt has labored breathing noted and accessory muscle use. Pt has audible crackles in lungs and junky cough.

## 2022-11-23 NOTE — ED Notes (Signed)
Full rainbow sent to lab and [facility]

## 2022-11-23 NOTE — ED Notes (Signed)
Pt is alert and oriented x4. Pt was oriented to call light and encouraged to call for assistance. Nasal cannula was changed and pt remains on 2 liters of oxygen. No distress observed.

## 2022-11-24 ENCOUNTER — Encounter: Payer: Self-pay | Admitting: Internal Medicine

## 2022-11-24 DIAGNOSIS — J690 Pneumonitis due to inhalation of food and vomit: Secondary | ICD-10-CM

## 2022-11-24 DIAGNOSIS — I5033 Acute on chronic diastolic (congestive) heart failure: Secondary | ICD-10-CM | POA: Diagnosis not present

## 2022-11-24 DIAGNOSIS — J9601 Acute respiratory failure with hypoxia: Secondary | ICD-10-CM | POA: Diagnosis not present

## 2022-11-24 DIAGNOSIS — E871 Hypo-osmolality and hyponatremia: Secondary | ICD-10-CM | POA: Insufficient documentation

## 2022-11-24 DIAGNOSIS — M25411 Effusion, right shoulder: Secondary | ICD-10-CM | POA: Insufficient documentation

## 2022-11-24 LAB — COMPREHENSIVE METABOLIC PANEL
ALT: 10 U/L (ref 0–44)
AST: 15 U/L (ref 15–41)
Albumin: 2 g/dL — ABNORMAL LOW (ref 3.5–5.0)
Alkaline Phosphatase: 15 U/L — ABNORMAL LOW (ref 38–126)
Anion gap: 7 (ref 5–15)
BUN: 15 mg/dL (ref 8–23)
CO2: 25 mmol/L (ref 22–32)
Calcium: 7.8 mg/dL — ABNORMAL LOW (ref 8.9–10.3)
Chloride: 109 mmol/L (ref 98–111)
Creatinine, Ser: 0.56 mg/dL — ABNORMAL LOW (ref 0.61–1.24)
GFR, Estimated: >60 mL/min (ref >60)
Glucose, Bld: 72 mg/dL (ref 70–99)
Potassium: 3.8 mmol/L (ref 3.5–5.1)
Sodium: 140 mmol/L (ref 135–145)
Total Bilirubin: 0.8 mg/dL (ref 0.3–1.2)
Total Protein: 6.4 g/dL — ABNORMAL LOW (ref 6.5–8.1)

## 2022-11-24 LAB — CBC
HCT: 31.2 % — ABNORMAL LOW (ref 39.0–52.0)
Hemoglobin: 9.5 g/dL — ABNORMAL LOW (ref 13.0–17.0)
MCH: 30.3 pg (ref 26.0–34.0)
MCHC: 30.4 g/dL (ref 30.0–36.0)
MCV: 99.4 fL (ref 80.0–100.0)
Platelets: 137 10*3/uL — ABNORMAL LOW (ref 150–400)
RBC: 3.14 MIL/uL — ABNORMAL LOW (ref 4.22–5.81)
RDW: 16.6 % — ABNORMAL HIGH (ref 11.5–15.5)
WBC: 4.4 10*3/uL (ref 4.0–10.5)
nRBC: 0 % (ref 0.0–0.2)

## 2022-11-24 LAB — BRAIN NATRIURETIC PEPTIDE: B Natriuretic Peptide: 369.2 pg/mL — ABNORMAL HIGH (ref 0.0–100.0)

## 2022-11-24 MED ORDER — OXYCODONE HCL 5 MG PO TABS: 5.0000 mg | ORAL_TABLET | Freq: Three times a day (TID) | ORAL | Status: DC | PRN

## 2022-11-24 MED ORDER — VITAMIN B-12 1000 MCG PO TABS
500.0000 ug | ORAL_TABLET | Freq: Every day | ORAL | Status: DC
  Administered 2022-11-24 – 2022-11-27 (×4): 500 ug via ORAL
  Filled 2022-11-24 (×4): qty 1

## 2022-11-24 MED ORDER — FUROSEMIDE 10 MG/ML IJ SOLN
40.0000 mg | Freq: Once | INTRAMUSCULAR | Status: AC
  Administered 2022-11-24: 40 mg via INTRAVENOUS
  Filled 2022-11-24: qty 4

## 2022-11-24 MED ORDER — IPRATROPIUM-ALBUTEROL 0.5-2.5 (3) MG/3ML IN SOLN
3.0000 mL | Freq: Four times a day (QID) | RESPIRATORY_TRACT | Status: DC
  Administered 2022-11-24 – 2022-11-25 (×3): 3 mL via RESPIRATORY_TRACT
  Filled 2022-11-24 (×4): qty 3

## 2022-11-24 MED ORDER — OXYCODONE HCL 5 MG PO TABS
5.0000 mg | ORAL_TABLET | Freq: Three times a day (TID) | ORAL | Status: DC | PRN
  Administered 2022-11-25 – 2022-11-26 (×3): 5 mg via ORAL
  Filled 2022-11-24 (×3): qty 1

## 2022-11-24 MED ORDER — METOPROLOL TARTRATE 25 MG PO TABS
12.5000 mg | ORAL_TABLET | Freq: Two times a day (BID) | ORAL | Status: DC
  Administered 2022-11-24 – 2022-11-27 (×7): 12.5 mg via ORAL
  Filled 2022-11-24 (×7): qty 1

## 2022-11-24 MED ORDER — ACETYLCYSTEINE 20 % IN SOLN
3.0000 mL | Freq: Three times a day (TID) | RESPIRATORY_TRACT | Status: DC
  Filled 2022-11-24: qty 4

## 2022-11-24 MED ORDER — TRAZODONE HCL 100 MG PO TABS
100.0000 mg | ORAL_TABLET | Freq: Every day | ORAL | Status: DC
  Administered 2022-11-24 – 2022-11-26 (×3): 100 mg via ORAL
  Filled 2022-11-24 (×3): qty 1

## 2022-11-24 MED ORDER — ACETYLCYSTEINE 20 % IN SOLN
4.0000 mL | Freq: Two times a day (BID) | RESPIRATORY_TRACT | Status: DC
  Administered 2022-11-24 – 2022-11-26 (×3): 4 mL via RESPIRATORY_TRACT
  Filled 2022-11-24 (×5): qty 4

## 2022-11-24 NOTE — Consult Note (Signed)
ORTHOPAEDIC CONSULTATION  REQUESTING PHYSICIAN: Marrion Coy, MD  Chief Complaint:   Right shoulder pain.  History of Present Illness: Dean [name]. is a 87 y.o. male with multiple medical problems including metastatic multiple myeloma treated with chemotherapy and radiation therapy, hypertension, paroxysmal atrial fibrillation requiring chronic treatment with Eliquis, gastroesophageal reflux disease, benign prostatic hypertrophy, diverticulitis, and a history of EtOH abuse.  The patient presented to the emergency room yesterday complaining of generalized weakness.  He had been receiving oral antibiotics for "congestion" over the preceding week with limited benefit.  On presentation to the emergency room he was noted to be in some respiratory distress and so has been admitted for further evaluation and treatment.  Apparently, an MRI scan of the right shoulder was obtained due to some concerns about a lytic process on plain radiographs.  Orthopedic consultation was requested as there was some concern by the radiologist for a septic arthritis of his shoulder.  The patient notes that he has had shoulder pain for over a year and that his recent shoulder symptoms were essentially unchanged.  He has been afebrile since his hospitalization and his white count has decreased from 6.4K yesterday to 4.4K this morning.    Past Medical History:  Diagnosis Date   BPH (benign prostatic hyperplasia)    Colonic diverticular abscess 01/08/2015   Diverticulitis 12/22/2014   complicated by abscess and required percutaneous drainage   GERD (gastroesophageal reflux disease)    H/O ETOH abuse    History of chemotherapy last done jan 2017   History of radiation therapy 01/05/13-02/10/13   45 gray to left occipital condyle region   History of radiation therapy 12/01/16-12/10/16   Parasternal nodule, chest- 24 Gy total delivered in 8 fractions, Left  sacro-iliac, pelvis- 24 Gy total delivered in 8 fractions    History of radiation therapy 02/19/17-02/22/17   right temporal scalp 30 Gy in 10 fractions   History of radiation therapy    Right hip( Pelvis) 11/18/21-11/21/21-Dr. Antony Blackbird   Intra-abdominal abscess (HCC)    Multiple myeloma (HCC) 2015   Neuropathy    Radiation 02/21/14-03/08/14   right posterior chest wall area 30 gray   Radiation 04/19/14-05/02/14   lumbar spine 25 gray   Skull lesion    Left occipital condyle   Wrist fracture, left    x 2   Past Surgical History:  Procedure Laterality Date   COLONOSCOPY N/A 04/19/2015   Procedure: COLONOSCOPY;  Surgeon: West Bali, MD;  Location: [facility];  Service: Endoscopy;  Laterality: N/A;  1:30 PM   CYST EXCISION  1959   tail bone   CYSTOSCOPY WITH FULGERATION N/A 08/20/2022   Procedure: CYSTOSCOPY WITH FULGERATION;  Surgeon: Crista Elliot, MD;  Location: [facility];  Service: Urology;  Laterality: N/A;  cysto clot evac   IR IMAGING GUIDED PORT INSERTION  09/25/2017   IR REMOVAL TUN ACCESS W/ PORT W/O FL MOD SED  10/21/2022   Social History   Socioeconomic History   Marital status: Significant Other    Spouse name: Mary   Number of children: 3   Years of education: Not on file   Highest education level: Not on file  Occupational History    Employer: RETIRED  Tobacco Use   Smoking status: Never   Smokeless tobacco: Never  Vaping Use   Vaping status: Never Used  Substance and Sexual Activity   Alcohol use: No    Comment: " Not much no more"  Drug use: No   Sexual activity: Not Currently  Other Topics Concern   Not on file  Social History Narrative   Not on file   Social Determinants of Health   Financial Resource Strain: Low Risk  (03/21/2022)   Overall Financial Resource Strain (CARDIA)    Difficulty of Paying Living Expenses: Not very hard  Food Insecurity: No Food Insecurity (11/24/2022)   Hunger Vital Sign    Worried About Running Out of Food in  the Last Year: Never true    Ran Out of Food in the Last Year: Never true  Transportation Needs: No Transportation Needs (11/24/2022)   PRAPARE - Administrator, Civil Service (Medical): No    Lack of Transportation (Non-Medical): No  Physical Activity: Inactive (01/09/2022)   Exercise Vital Sign    Days of Exercise per Week: 0 days    Minutes of Exercise per Session: 0 min  Stress: Stress Concern Present (01/09/2022)   [employer] of Occupational Health - Occupational Stress Questionnaire    Feeling of Stress : To some extent  Social Connections: Moderately Isolated (01/09/2022)   Social Connection and Isolation Panel [NHANES]    Frequency of Communication with Friends and Family: Three times a week    Frequency of Social Gatherings with Friends and Family: Three times a week    Attends Religious Services: Never    Active Member of Clubs or Organizations: No    Attends Engineer, structural: Never    Marital Status: Living with partner   Family History  Problem Relation Age of Onset   Diabetes Mother    Stroke Mother    Hypertension Father    Allergies  Allergen Reactions   Codeine Other (See Comments)    Headache   Morphine And Codeine Other (See Comments)    Makes him feel weird   Revlimid [Lenalidomide] Other (See Comments)    "Causes me to become weak"   Prior to Admission medications   Medication Sig Start Date End Date Taking? Authorizing Provider  acetaminophen (TYLENOL) 500 MG tablet Take 500-1,000 mg by mouth every 6 (six) hours as needed for mild pain or moderate pain.   Yes [provider]  acyclovir (ZOVIRAX) 400 MG tablet Take 400 mg by mouth 2 (two) times daily.   Yes [provider]  amoxicillin-clavulanate (AUGMENTIN) 875-125 MG tablet Take 1 tablet by mouth 2 (two) times daily. 11/13/22  Yes Donita Brooks, MD  Calcium Carbonate (CALCIUM 500 PO) Take 750 mg by mouth daily.   Yes [provider]   cyanocobalamin (VITAMIN B12) 500 MCG tablet Take 1 tablet (500 mcg total) by mouth daily. 02/13/22  Yes Tat, Onalee Hua, MD  ELIQUIS 5 MG TABS tablet TAKE ONE TABLET BY MOUTH TWICE DAILY 10/28/22  Yes Jeralyn Ruths, MD  ferrous sulfate 325 (65 FE) MG tablet Take 325 mg by mouth daily.   Yes [provider]  folic acid (FOLVITE) 1 MG tablet TAKE ONE TABLET (1 MG TOTAL) BY MOUTH DAILY. 10/28/22  Yes Jeralyn Ruths, MD  furosemide (LASIX) 20 MG tablet Take 1 tablet (20 mg total) by mouth daily as needed. Patient taking differently: Take 20 mg by mouth every other day. 04/22/22  Yes Jeralyn Ruths, MD  gabapentin (NEURONTIN) 400 MG capsule Take 1 capsule (400 mg total) by mouth 3 (three) times daily. Patient taking differently: Take 400 mg by mouth 2 (two) times daily. 04/21/22  Yes Jeralyn Ruths, MD  guaiFENesin-dextromethorphan (ROBITUSSIN DM) 100-10 MG/5ML syrup Take 5 mLs by mouth every 4 (four) hours as needed for cough (chest congestion). 08/24/22  Yes Meredeth Ide, MD  Melatonin 10 MG TABS Take 10 mg by mouth at bedtime.   Yes [provider]  metoprolol tartrate (LOPRESSOR) 25 MG tablet Take 0.5 tablets (12.5 mg total) by mouth 2 (two) times daily. 11/13/22 12/13/22 Yes Donita Brooks, MD  montelukast (SINGULAIR) 10 MG tablet Take 1 tablet (10 mg total) by mouth at bedtime. 09/30/22  Yes Donita Brooks, MD  Oxycodone HCl 10 MG TABS Take 1 tablet (10 mg total) by mouth 3 (three) times daily as needed. 10/28/22  Yes Jeralyn Ruths, MD  tamsulosin (FLOMAX) 0.4 MG CAPS capsule TAKE ONE CAPSULE BY MOUTH EVERY NIGHT ATBEDTIME 08/29/22  Yes Donita Brooks, MD  traZODone (DESYREL) 100 MG tablet TAKE THREE TABLETS BY MOUTH EVERY NIGHT AT BEDTIME Patient taking differently: Take 100-300 mg by mouth at bedtime. 07/28/22  Yes Borders, Daryl Eastern, NP  TURMERIC PO Take 1 tablet by mouth 2 (two) times daily.   Yes [provider]  azithromycin (ZITHROMAX) 250 MG  tablet 2 tabs poqday1, 1 tab poqday 2-5 11/13/22   Donita Brooks, MD  lidocaine-prilocaine (EMLA) cream Apply small amount over port one (1) hour prior to appointment. Patient not taking: Reported on 11/23/2022 09/17/19   Doreatha Massed, MD  pomalidomide (POMALYST) 3 MG capsule Take 1 capsule (3 mg total) by mouth daily. Take for 21 days, then hold for 7 days. Repeat every 28 days. Patient not taking: Reported on 11/23/2022 09/29/22   Jeralyn Ruths, MD  predniSONE (DELTASONE) 20 MG tablet 3 tabs poqday 1-2, 2 tabs poqday 3-4, 1 tab poqday 5-6 Patient not taking: Reported on 11/23/2022 11/13/22   Donita Brooks, MD   MR SHOULDER RIGHT W WO CONTRAST  Result Date: 11/23/2022 CLINICAL DATA:  Soft tissue infection suspected, shoulder, xray done. Multiple myeloma EXAM: MRI OF THE RIGHT SHOULDER WITHOUT AND WITH CONTRAST TECHNIQUE: Multiplanar, multisequence MR imaging of the right shoulder shoulder was performed before and after the administration of intravenous contrast. CONTRAST:  9mL GADAVIST GADOBUTROL 1 MMOL/ML IV SOLN COMPARISON:  CT chest 08/19/2022 FINDINGS: Technical Note: Despite efforts by the technologist and patient, motion artifact is present on today's exam and could not be eliminated. This reduces exam sensitivity and specificity. Rotator cuff: Moderate talar cuff tendinosis with small partial-thickness rim rent tear of the anterior supraspinatus tendon insertion. No full-thickness or retracted rotator cuff tear. Muscles: Generalized muscle atrophy. Mild supraspinatus and infraspinatus intramuscular edema. Biceps long head: Intra-articular biceps tendon is not identified, likely torn and retracted. Acromioclavicular Joint: Small-moderate acromioclavicular joint effusion with synovial enhancement. Mild bone marrow edema within the distal clavicle and adjacent acromion. Mild subacromial-subdeltoid bursitis. Glenohumeral Joint: No glenohumeral joint effusion. Moderate diffuse  chondral thinning and surface irregularity. Labrum:  Diffusely degenerated. Bones: Numerous marrow replacing bone lesions throughout the imaged scapula and proximal humerus including dominant lesion centered within the posteroinferior glenoid measuring 3.1 x 2.2 x 2.8 cm. There is cortical breakthrough and extraosseous extension of tumor along the posterior glenoid neck. Additional expansile bone lesion within in adjacent upper right rib (series 11, image 17). Shoulder joint intact without fracture or dislocation. Other: No fluid collections about the shoulder. IMPRESSION: 1. Small-moderate acromioclavicular joint effusion with synovial enhancement. Mild bone marrow edema within the distal clavicle and adjacent acromion. Findings are nonspecific and may be degenerative or reactive. Septic arthritis  is not excluded in the appropriate clinical setting. Correlate with serum inflammatory markers. 2. Numerous marrow replacing bone lesions throughout the imaged scapula and right proximal humerus including dominant lesion centered within the posteroinferior glenoid measuring 3.1 x 2.2 x 2.8 cm. There is cortical breakthrough and extraosseous extension of tumor along the posterior glenoid neck. Additional expansile bone lesion within an adjacent upper right rib. Findings are compatible with the patient's history of multiple myeloma. 3. Moderate rotator cuff tendinosis with small partial-thickness rim rent tear of the anterior supraspinatus tendon insertion. No full-thickness or retracted rotator cuff tear. 4. Mild subacromial-subdeltoid bursitis. 5. Intra-articular biceps tendon is not identified, likely torn and retracted. Electronically Signed   By: Duanne Guess D.O.   On: 11/23/2022 20:11   DG Chest Portable 1 View  Result Date: 11/23/2022 CLINICAL DATA:  Congestion and weakness EXAM: PORTABLE CHEST 1 VIEW COMPARISON:  Chest x-ray dated October 18, 2022 FINDINGS: Cardiac and mediastinal contours are unchanged.  New left retrocardiac opacity. Small bilateral pleural effusions. Unchanged linear opacity of the right upper lobe, likely due to scarring. Osseous heterogeneity, likely due to underlying metastatic disease. Chronic appearing bilateral rib fractures and old left clavicle fracture. IMPRESSION: 1. New left retrocardiac opacity, may be due to atelectasis, infection or aspiration. 2. Small bilateral pleural effusions. Electronically Signed   By: Allegra Lai M.D.   On: 11/23/2022 11:36   CT HEAD WO CONTRAST ( )  Result Date: 11/23/2022 CLINICAL DATA:  87 year old male with history of confusion and weakness. EXAM: CT HEAD WITHOUT CONTRAST TECHNIQUE: Contiguous axial images were obtained from the base of the skull through the vertex without intravenous contrast. RADIATION DOSE REDUCTION: This exam was performed according to the departmental dose-optimization program which includes automated exposure control, adjustment of the mA and/or kV according to patient size and/or use of iterative reconstruction technique. COMPARISON:  Head CT 10/16/2012. FINDINGS: Brain: Moderate cerebral and cerebellar atrophy. Patchy and confluent areas of decreased attenuation are noted throughout the deep and periventricular white matter of the cerebral hemispheres bilaterally, compatible with chronic microvascular ischemic disease. No evidence of acute infarction, hemorrhage, hydrocephalus, extra-axial collection or mass lesion/mass effect. Vascular: No hyperdense vessel or unexpected calcification. Skull: Numerous well-circumscribed lytic lesions scattered throughout the skull, similar to the prior study. Sinuses/Orbits: No acute finding. Other: None. IMPRESSION: 1. No acute intracranial abnormalities. 2. Moderate cerebral and cerebellar atrophy with chronic microvascular ischemic changes in the cerebral white matter, as above. 3. Numerous osseous lesions again noted in the skull compatible with reported clinical history of  multiple myeloma. Electronically Signed   By: Trudie Reed M.D.   On: 11/23/2022 08:53    Positive ROS: All other systems have been reviewed and were otherwise negative with the exception of those mentioned in the HPI and as above.  Physical Exam: General:  Alert, no acute distress Psychiatric:  Patient is competent for consent with normal mood and affect   Cardiovascular:  No pedal edema Respiratory:  No wheezing, non-labored breathing GI:  Abdomen is soft and non-tender Skin:  No lesions in the area of chief complaint Neurologic:  Sensation intact distally Lymphatic:  No axillary or cervical lymphadenopathy  Orthopedic Exam:  Orthopedic examination is limited to the right shoulder and upper extremity.  Skin inspection around the right shoulder is notable for perhaps a trace amount of swelling, but otherwise is unremarkable.  No erythema, ecchymosis, abrasions, or other skin abnormalities are identified.  He has minimal tenderness to palpation over the anterior and lateral  aspects of the shoulder.  Actively, he is able to reach the top of his head and the back of his neck without pain.  Passively, he can tolerate forward flexion to 140 degrees and abduction to 130 degrees.  At 90 degrees of abduction, and tolerate external rotation to 80 degrees and internal rotation to 45 degrees.  He has only minimal discomfort with these motions.  He exhibits 3+-4/5 strength with resisted internal and external rotation, as well as with gentle resisted abduction with his arm at his side.  Neurologically, the patient demonstrates obvious intrinsic muscle wasting in his right hand.  He exhibits 3+/5 strength with grip strength testing but has only 2/5 strength with intrinsic muscle testing.  He has good capillary refill to all digits.  X-rays:  A recent MRI scan of the right shoulder is available for review and has been reviewed by myself.  The findings are as described above.  There is evidence of multiple  myeloma lesions involving both the humeral head and the glenoid with posterior bony erosion of the glenoid.  No significant joint effusion is noted there is any significant reactive bone marrow edema around the areas of lysis to suggest an infectious process.  Assessment: Right shoulder pain secondary to metastatic multiple myeloma.  No evidence of septic arthritis of the right shoulder.  Plan: The treatment options have been discussed with the patient.  The patient understands that there is little if anything that can be done to help his right shoulder symptoms at this time.  However, he is reassured that there is no evidence of infection in the shoulder at this time.  He may continue with his normal daily activities as symptoms permit, and take medications for discomfort as deemed appropriate from a medical standpoint.  Thank you for asking me to participate in the care of this most delightful yet unfortunate man.  I will sign off at this time.  If there is further need of orthopedic input during his hospitalization, please reconsult me.   Maryagnes Amos, MD  Beeper #:  [phone]  11/24/2022 4:17 PM

## 2022-11-24 NOTE — ED Notes (Signed)
Pt assisted off of bedpan. Pt had BM. Pt cleaned.

## 2022-11-24 NOTE — ED Notes (Signed)
Pt's pain assessed. Pt reported 8 out of 10 pain. Pt offered pain scale appropriate medication. Pt refuse oxycodone. Pt requesting acetaminophen for headache.

## 2022-11-24 NOTE — TOC Initial Note (Addendum)
Transition of Care (TOC) - Initial/Assessment Note    Patient Details  Name: Dean [name]. MRN: [id number] Date of Birth: 03-08-1935  Transition of Care [facility]) CM/SW Contact:    Marquita Palms, LCSW Phone Number: 11/24/2022, 4:12 PM  Clinical Narrative:                   Expected Discharge Plan: Home/Self Care (Home with son) Barriers to Discharge: Continued Medical Work up   Patient Goals and CMS Choice    CSW met with patient at bedside. Patient discussed his current living arrangement is with his son. He reports that he was in a "rehab in [place]." Patient reports that he has been having problems with his feet and that is why they are bandaged up. Patient reports that his pharmacy is [facility] in [place]. He reports that he has PCP and insurance. Patient is currently with [facility] and would like resumption orders upon discharge. No other needs at this time.        Expected Discharge Plan and Services       Living arrangements for the past 2 months: Single Family Home (Reports he has been in rehab)                                      Prior Living Arrangements/Services Living arrangements for the past 2 months: Single Family Home (Reports he has been in rehab) Lives with:: Relatives   Do you feel safe going back to the place where you live?: Yes (Home)               Activities of Daily Living   ADL Screening (condition at time of admission) Independently performs ADLs?: No Does the patient have a NEW difficulty with bathing/dressing/toileting/self-feeding that is expected to last >3 days?: No Does the patient have a NEW difficulty with getting in/out of bed, walking, or climbing stairs that is expected to last >3 days?: No Does the patient have a NEW difficulty with communication that is expected to last >3 days?: No Is the patient deaf or have difficulty hearing?: No Does the patient have difficulty seeing, even when  wearing glasses/contacts?: No Does the patient have difficulty concentrating, remembering, or making decisions?: Yes  Permission Sought/Granted                  Emotional Assessment Appearance:: Disheveled Attitude/Demeanor/Rapport: Gracious Affect (typically observed): Calm Orientation: : Oriented to Self, Oriented to Place, Oriented to  Time, Oriented to Situation      Admission diagnosis:  Acute hypoxemic respiratory failure (HCC) [J96.01] Patient Active Problem List   Diagnosis Date Noted   Hyponatremia 11/24/2022   Aspiration pneumonitis (HCC) 11/24/2022   Acute on chronic diastolic CHF (congestive heart failure) (HCC) 11/24/2022   Effusion of joint of right shoulder 11/24/2022   Acute hypoxemic respiratory failure (HCC) 11/23/2022   Mass of skin of right shoulder 11/23/2022   Skin lesion of foot 11/23/2022   Chest pain 11/23/2022   Bacteremia due to Gram-negative bacteria 10/21/2022   Cellulitis of right arm 10/21/2022   Sepsis due to cellulitis (HCC) 10/17/2022   Leukopenia 10/17/2022   Paroxysmal A-fib (HCC) 10/17/2022   Sepsis (HCC) 10/17/2022   Acute hypoxic respiratory failure (HCC) 08/19/2022   PNA (pneumonia) 08/19/2022   Urethral bleeding 08/19/2022   Uncomplicated opioid dependence (HCC) 02/13/2022   SVT (supraventricular tachycardia) (  HCC) 02/12/2022   Pulmonary embolism (HCC) 02/10/2022   Acute metabolic encephalopathy 02/10/2022   Pain in left arm 10/16/2021   Closed fracture of left clavicle 08/19/2021   Spinal stenosis, lumbar 10/13/2016   Chronic back pain 10/01/2016   DDD (degenerative disc disease), lumbar 10/01/2016   OA (osteoarthritis) of knee 10/01/2016   Diverticulitis s/p robotic sigmoid colectomy 07/11/2015 07/11/2015   Goals of care, counseling/discussion    Abnormal CT scan, sigmoid colon 04/02/2015   Abnormal PET scan of colon 04/02/2015   Thrombocytopenia (HCC) 01/09/2015   Multiple myeloma without remission (HCC) 02/15/2014    Allergic rhinitis 01/16/2014   History of vertebral stress fracture 01/16/2014   Dermatitis 01/16/2014   Loss of weight 09/05/2013   Insomnia 09/05/2013   Fatigue 05/16/2013   Neck pain 12/07/2012   Skull lesion 11/29/2012   Neuropathy (HCC)    BPH (benign prostatic hyperplasia)    GERD (gastroesophageal reflux disease)    PCP:  Donita Brooks, MD Pharmacy:   [facility] - [place], [place] - [address] [place] [address] Phone: [phone] Fax: [phone]  [facility], [place] - [address] [address] [place] [address] Phone: [phone] Fax: [phone]     Social Determinants of Health (SDOH) Social History: SDOH Screenings   Food Insecurity: No Food Insecurity (11/24/2022)  Housing: Low Risk  (11/24/2022)  Transportation Needs: No Transportation Needs (11/24/2022)  Utilities: Not At Risk (11/24/2022)  Alcohol Screen: Low Risk  (01/09/2022)  Depression (PHQ2-9): Low Risk  (09/16/2022)  Financial Resource Strain: Low Risk  (03/21/2022)  Physical Activity: Inactive (01/09/2022)  Social Connections: Moderately Isolated (01/09/2022)  Stress: Stress Concern Present (01/09/2022)  Tobacco Use: Low Risk  (11/24/2022)   SDOH Interventions:     Readmission Risk Interventions    11/24/2022    4:10 PM 08/20/2022    1:21 PM  Readmission Risk Prevention Plan  Transportation Screening Complete Complete  PCP or Specialist Appt within 3-5 Days  Complete  HRI or Home Care Consult  Complete  Social Work Consult for Recovery Care Planning/Counseling  Complete  Palliative Care Screening  Not Applicable  Medication Review Oceanographer) Complete Complete  HRI or Home Care Consult Complete   SW Recovery Care/Counseling Consult Complete   Palliative Care Screening Not Applicable   Skilled Nursing Facility Not Applicable

## 2022-11-24 NOTE — ED Notes (Signed)
Pt stating needing to have a BM. Pt assisted onto bedpan by this RN and Geographical information systems officer

## 2022-11-24 NOTE — Evaluation (Signed)
Clinical/Bedside Swallow Evaluation Patient Details  Name: Dean [name]. MRN: [id number] Date of Birth: March 19, 1935  Today's Date: 11/24/2022 Time: SLP Start Time (ACUTE ONLY): 1230 SLP Stop Time (ACUTE ONLY): 1330 SLP Time Calculation (min) (ACUTE ONLY): 60 min  Past Medical History:  Past Medical History:  Diagnosis Date   BPH (benign prostatic hyperplasia)    Colonic diverticular abscess 01/08/2015   Diverticulitis 12/22/2014   complicated by abscess and required percutaneous drainage   GERD (gastroesophageal reflux disease)    H/O ETOH abuse    History of chemotherapy last done jan 2017   History of radiation therapy 01/05/13-02/10/13   45 gray to left occipital condyle region   History of radiation therapy 12/01/16-12/10/16   Parasternal nodule, chest- 24 Gy total delivered in 8 fractions, Left sacro-iliac, pelvis- 24 Gy total delivered in 8 fractions    History of radiation therapy 02/19/17-02/22/17   right temporal scalp 30 Gy in 10 fractions   History of radiation therapy    Right hip( Pelvis) 11/18/21-11/21/21-Dr. Antony Blackbird   Intra-abdominal abscess (HCC)    Multiple myeloma (HCC) 2015   Neuropathy    Radiation 02/21/14-03/08/14   right posterior chest wall area 30 gray   Radiation 04/19/14-05/02/14   lumbar spine 25 gray   Skull lesion    Left occipital condyle   Wrist fracture, left    x 2   Past Surgical History:  Past Surgical History:  Procedure Laterality Date   COLONOSCOPY N/A 04/19/2015   Procedure: COLONOSCOPY;  Surgeon: West Bali, MD;  Location: [facility];  Service: Endoscopy;  Laterality: N/A;  1:30 PM   CYST EXCISION  1959   tail bone   CYSTOSCOPY WITH FULGERATION N/A 08/20/2022   Procedure: CYSTOSCOPY WITH FULGERATION;  Surgeon: Crista Elliot, MD;  Location: [facility];  Service: Urology;  Laterality: N/A;  cysto clot evac   IR IMAGING GUIDED PORT INSERTION  09/25/2017   IR REMOVAL TUN ACCESS W/ PORT W/O FL MOD SED  10/21/2022   HPI:  Pt   is a 87 year old male with history of multiple myeloma, end-stage, hypertension, BPH, insomnia, paroxysmal atrial fibrillation, history of PE, on Eliquis, who presents to the emergency department for chief concerns of weakness for 1 week+ and has been more sedentary.  he was recently being treated for pneumonia.  Per ED note: "Pt is a cancer pt and lives with his daughter. Pt has been sick and taking a z-pack for the last week but is not feeling better. Pt has strong smell of urine per EMS. EMS reports pt is AMS and weak. Pt gets cancer treatment here but has stopped chemo and radiating because he is end stage.".  CT head wo contrast: Read as no acute intracranial abnormalities.  Moderate cerebral and cerebellar atrophy with chronic microvascular ischemic changes in the cerebral white matter.  Numerous osseous lesions again noted in the skull compatible with history of multiple myeloma.   CXR: New left retrocardiac opacity, may be due to atelectasis,  infection or aspiration.  2. Small bilateral pleural effusions.    Assessment / Plan / Recommendation  Clinical Impression   Pt seen for BSE today. Pt awake, alert and verbal. He answered all questions. Pt sitting up in bed and helped to feed self w/ support. Dtr present for support. Pt appeared weak.  On Orin O2 support 2L; wbc wnl. Afebrile.   Pt appears to present w/ functional oropharyngeal phase swallow w/ No overt oropharyngeal phase dysphagia  noted, No neuromuscular deficits noted. Pt consumed po trials w/ No overt clinical s/s of aspiration during po trials.  Pt appears at reduced risk for aspiration following general aspiration precautions. However, pt does have challenging factors that could impact oropharyngeal swallowing to include significant deconditioning/weakness d/t chronic illness and Cancer as well as advanced age. These factors can increase risk for aspiration, dysphagia as well as decreased oral intake overall.  During po trials, pt consumed  all consistencies w/ no overt coughing, decline in vocal quality, or change in respiratory presentation during/post trials. O2 sats maintained in upper 90s. Oral phase appeared grossly [facility] w/ timely bolus management, mastication, and control of bolus propulsion for A-P transfer for swallowing. Oral clearing achieved w/ all trial consistencies -- moistened, soft foods given d/t missing Dentition.  OM Exam appeared grossly [facility] w/ no sustained unilateral weakness noted. Speech intelligible, clear. Pt fed self w/ setup support.   Recommend continue a more mech soft consistency diet w/ well-Cut meats, moistened foods; Thin liquids -- carefully monitor straw use, and pt to Hold Cup when drinking. He does not often use straws so stop use if coughing noted. Recommend general aspiration precautions, tray setup and support at meals as needed. Positioning support. Pills WHOLE in Puree for safer, easier swallowing if needed.  Education given on Pills in Puree; food consistencies and easy to eat options; general aspiration precautions to pt and Dtr. NSG to reconsult if any new needs arise. NSG updated, agreed. MD updated. Recommend Dietician f/u for support. Palliative Care f/u for support. SLP Visit Diagnosis: Dysphagia, unspecified (R13.10)    Aspiration Risk  Mild aspiration risk;Risk for inadequate nutrition/hydration (d/t weakness overall but reduced following general aspiration precautions)    Diet Recommendation   Thin;Dysphagia 3 (mechanical soft) = a more mech soft consistency diet w/ well-Cut meats, moistened foods; Thin liquids -- carefully monitor straw use, and pt to Hold Cup when drinking. He does not often use straws so stop use if coughing noted. Recommend general aspiration precautions, tray setup and support at meals as needed. Positioning support.   Medication Administration: Whole meds with puree (as needed for ease of swallowing)    Other  Recommendations Recommended Consults:  (Dietician;  Palliative Care f/u for Ca) Oral Care Recommendations: Oral care BID;Oral care before and after PO;Staff/trained caregiver to provide oral care    Recommendations for follow up therapy are one component of a multi-disciplinary discharge planning process, led by the attending physician.  Recommendations may be updated based on patient status, additional functional criteria and insurance authorization.  Follow up Recommendations No SLP follow up      Assistance Recommended at Discharge  intermittent  Functional Status Assessment Patient has not had a recent decline in their functional status (pt pt and Dtr)  Frequency and Duration  (n/a)   (n/a)       Prognosis Prognosis for improved oropharyngeal function: Fair (-Good) Barriers to Reach Goals: Time post onset;Severity of deficits Barriers/Prognosis Comment: weakness from Cancer      Swallow Study   General Date of Onset: 11/23/22 HPI: Pt  is a 87 year old male with history of multiple myeloma, end-stage, hypertension, BPH, insomnia, paroxysmal atrial fibrillation, history of PE, on Eliquis, who presents to the emergency department for chief concerns of weakness for 1 week+ and has been more sedentary.  he was recently being treated for pneumonia.  Per ED note: "Pt is a cancer pt and lives with his daughter. Pt has been sick and taking a  z-pack for the last week but is not feeling better. Pt has strong smell of urine per EMS. EMS reports pt is AMS and weak. Pt gets cancer treatment here but has stopped chemo and radiating because he is end stage.".  CT head wo contrast: Read as no acute intracranial abnormalities.  Moderate cerebral and cerebellar atrophy with chronic microvascular ischemic changes in the cerebral white matter.  Numerous osseous lesions again noted in the skull compatible with history of multiple myeloma.   CXR: New left retrocardiac opacity, may be due to atelectasis,  infection or aspiration.  2. Small bilateral pleural  effusions. Type of Study: Bedside Swallow Evaluation Previous Swallow Assessment: none Diet Prior to this Study: Thin liquids (Level 0);Dysphagia 3 (mechanical soft) (soft foods) Temperature Spikes Noted: No (wbc 4.4) Respiratory Status: Nasal cannula (2L) History of Recent Intubation: No Behavior/Cognition: Alert;Cooperative;Pleasant mood;Requires cueing (min) Oral Cavity Assessment: Within Functional Limits Oral Care Completed by SLP: Yes Oral Cavity - Dentition: Missing dentition Vision: Functional for self-feeding Self-Feeding Abilities: Able to feed self;Needs assist;Needs set up (weak) Patient Positioning: Upright in bed (needed support) Baseline Vocal Quality: Normal Volitional Cough: Strong Volitional Swallow: Able to elicit    Oral/Motor/Sensory Function Overall Oral Motor/Sensory Function: Within functional limits (grossly though intermittent left lingual deviation - chronic as pt's Dtr stated he "leans that way too"?)   Ice Chips Ice chips: Within functional limits Presentation: Spoon (fed; 5 trials)   Thin Liquid Thin Liquid: Within functional limits Presentation: Cup;Self Fed;Straw (~3 ozs total)    Nectar Thick Nectar Thick Liquid: Not tested   Honey Thick Honey Thick Liquid: Not tested   Puree Puree: Within functional limits Presentation: Spoon (fed; ~3 ozs)   Solid     Solid: Within functional limits Presentation: Self Fed (8 trials) Other Comments: moistened        Jerilynn Som, MS, CCC-SLP Speech Language Pathologist Rehab Services; [facility] [phone] (ascom) Osher Oettinger 11/24/2022,2:40 PM

## 2022-11-24 NOTE — Progress Notes (Signed)
Progress Note   Patient: Dean [name]. WJX:[id number] DOB: January 03, 1936 DOA: 11/23/2022     1 DOS: the patient was seen and examined on 11/24/2022   Brief hospital course: [name] is a 87 year old male with history of multiple myeloma, end-stage, hypertension, BPH, insomnia, paroxysmal atrial fibrillation, history of PE, on Eliquis, who presents to the emergency department for chief concerns of weakness for 1 week. He had a significant cough and shortness of breath.  Hypoxemia, was placed on 2 L oxygen.  Patient was also placed on antibiotics Rocephin and Zithromax for possible pneumonia.   Principal Problem:   Acute hypoxemic respiratory failure (HCC) Active Problems:   Uncomplicated opioid dependence (HCC)   Insomnia   OA (osteoarthritis) of knee   SVT (supraventricular tachycardia) (HCC)   Paroxysmal A-fib (HCC)   Mass of skin of right shoulder   Skin lesion of foot   Chest pain   Assessment and Plan: * Acute hypoxemic respiratory failure (HCC) Aspiration pneumonitis. Patient has significant short of breath, copious amount of thick mucus.  He does not seem to have any dysphagia.  Reviewed chest x-ray, posterior infiltrates, patient currently on antibiotics with Rocephin and azithromycin max.  No other concerning for atypical pneumonia, discontinue Zithromax. Procalcitonin level less than 0.1, patient probably has aspiration pneumonitis instead of bacterial pneumonia.  Continue Rocephin and Flagyl. Start DuoNeb.  Obtain speech therapy evaluation.  Likely acute on chronic diastolic congestive heart failure. Reviewed echocardiogram performed in January 2024, patient had a moderate pulm hypertension with normal ejection fraction and grade 2 diastolic dysfunction. Patient has mild elevation in BNP, this is probably triggered by aspiration episode, will give a dose of Lasix.  End-stage multiple myeloma. Right shoulder bony lesion from multiple myeloma. Right  shoulder joint effusion. MRI of the right shoulder showed evidence of joint effusion, could not rule out septic joint.  Obtain orthopedic consult.  Uncomplicated opioid dependence (HCC) Continue with reduced pain medicine.  Chest pain Negative troponin, to be bony pain from multiple myelosis.  Skin lesion of foot Prior wound to the foot, healing.  No infection.  Paroxysmal A-fib (HCC) Continue anticoagulation, heart rate under control.  Insomnia Continue trazodone.       Subjective:  Patient still has some short of breath with exertion, still requiring oxygen.  Patient has copious amount of thick mucus.  Physical Exam: Vitals:   11/24/22 0908 11/24/22 0909 11/24/22 1100 11/24/22 1208  BP: 129/62  (!) 140/117 112/68  Pulse: (!) 101  97 100  Resp: 10  17 (!) 21  Temp:  97.7 F (36.5 C)    TempSrc:  Oral    SpO2: 100%  100% 100%   General exam: Appears calm and comfortable  Respiratory system: Rhonchi in the base.Marland Kitchen Respiratory effort normal. Cardiovascular system: Irregular. No JVD, murmurs, rubs, gallops or clicks. No pedal edema. Gastrointestinal system: Abdomen is nondistended, soft and nontender. No organomegaly or masses felt. Normal bowel sounds heard. Central nervous system: Alert and oriented. No focal neurological deficits. Extremities: Symmetric 5 x 5 power. Skin: Left ankle wound appears to be healing. Psychiatry: Judgement and insight appear normal. Mood & affect appropriate.    Data Reviewed:  X-ray and lab results reviewed.  Family Communication: Daughter updated.  Disposition: Status is: Inpatient Remains inpatient appropriate because:  Severity of disease, IV treatment.     Time spent: 55 minutes  Author: Marrion Coy, MD 11/24/2022 1:05 PM  For on call review [email or web address].

## 2022-11-24 NOTE — ED Notes (Addendum)
Pt assisted off bedpan. No BM noted. Pt cleaned. New brief placed.

## 2022-11-25 ENCOUNTER — Ambulatory Visit: Payer: Medicare Other

## 2022-11-25 ENCOUNTER — Inpatient Hospital Stay: Payer: Medicare Other | Admitting: Oncology

## 2022-11-25 ENCOUNTER — Inpatient Hospital Stay: Payer: Medicare Other | Admitting: Pharmacist

## 2022-11-25 ENCOUNTER — Inpatient Hospital Stay: Payer: Medicare Other

## 2022-11-25 DIAGNOSIS — E876 Hypokalemia: Secondary | ICD-10-CM | POA: Insufficient documentation

## 2022-11-25 DIAGNOSIS — Z515 Encounter for palliative care: Secondary | ICD-10-CM

## 2022-11-25 DIAGNOSIS — I5033 Acute on chronic diastolic (congestive) heart failure: Secondary | ICD-10-CM | POA: Diagnosis not present

## 2022-11-25 DIAGNOSIS — J9601 Acute respiratory failure with hypoxia: Secondary | ICD-10-CM | POA: Diagnosis not present

## 2022-11-25 DIAGNOSIS — E440 Moderate protein-calorie malnutrition: Secondary | ICD-10-CM | POA: Insufficient documentation

## 2022-11-25 DIAGNOSIS — J690 Pneumonitis due to inhalation of food and vomit: Secondary | ICD-10-CM | POA: Diagnosis not present

## 2022-11-25 LAB — CBC
HCT: 33.2 % — ABNORMAL LOW (ref 39.0–52.0)
Hemoglobin: 10 g/dL — ABNORMAL LOW (ref 13.0–17.0)
MCH: 29.5 pg (ref 26.0–34.0)
MCHC: 30.1 g/dL (ref 30.0–36.0)
MCV: 97.9 fL (ref 80.0–100.0)
Platelets: 143 10*3/uL — ABNORMAL LOW (ref 150–400)
RBC: 3.39 MIL/uL — ABNORMAL LOW (ref 4.22–5.81)
RDW: 16.2 % — ABNORMAL HIGH (ref 11.5–15.5)
WBC: 4.9 10*3/uL (ref 4.0–10.5)
nRBC: 0 % (ref 0.0–0.2)

## 2022-11-25 LAB — BASIC METABOLIC PANEL
Anion gap: 6 (ref 5–15)
BUN: 18 mg/dL (ref 8–23)
CO2: 27 mmol/L (ref 22–32)
Calcium: 7.8 mg/dL — ABNORMAL LOW (ref 8.9–10.3)
Chloride: 108 mmol/L (ref 98–111)
Creatinine, Ser: 0.59 mg/dL — ABNORMAL LOW (ref 0.61–1.24)
GFR, Estimated: >60 mL/min (ref >60)
Glucose, Bld: 87 mg/dL (ref 70–99)
Potassium: 3.2 mmol/L — ABNORMAL LOW (ref 3.5–5.1)
Sodium: 141 mmol/L (ref 135–145)

## 2022-11-25 LAB — MAGNESIUM: Magnesium: 2 mg/dL (ref 1.7–2.4)

## 2022-11-25 MED ORDER — POTASSIUM CHLORIDE CRYS ER 20 MEQ PO TBCR
40.0000 meq | EXTENDED_RELEASE_TABLET | ORAL | Status: AC
  Administered 2022-11-25: 40 meq via ORAL
  Filled 2022-11-25: qty 2

## 2022-11-25 MED ORDER — ENSURE ENLIVE PO LIQD
237.0000 mL | Freq: Three times a day (TID) | ORAL | Status: DC
  Administered 2022-11-25 – 2022-11-27 (×6): 237 mL via ORAL

## 2022-11-25 MED ORDER — ADULT MULTIVITAMIN W/MINERALS CH
1.0000 | ORAL_TABLET | Freq: Every day | ORAL | Status: DC
  Administered 2022-11-25 – 2022-11-27 (×3): 1 via ORAL
  Filled 2022-11-25 (×3): qty 1

## 2022-11-25 MED ORDER — IPRATROPIUM-ALBUTEROL 0.5-2.5 (3) MG/3ML IN SOLN
3.0000 mL | Freq: Two times a day (BID) | RESPIRATORY_TRACT | Status: DC
  Administered 2022-11-25 – 2022-11-26 (×2): 3 mL via RESPIRATORY_TRACT
  Filled 2022-11-25 (×2): qty 3

## 2022-11-25 NOTE — Progress Notes (Signed)
Progress Note   Patient: Dean [name]. [id number]:[id number] DOB: 09/13/1935 DOA: 11/23/2022     2 DOS: the patient was seen and examined on 11/25/2022   Brief hospital course: [name] is a 87 year old male with history of multiple myeloma, end-stage, hypertension, BPH, insomnia, paroxysmal atrial fibrillation, history of PE, on Eliquis, who presents to the emergency department for chief concerns of weakness for 1 week. He had a significant cough and shortness of breath.  Hypoxemia, was placed on 2 L oxygen.  Patient was also placed on antibiotics Rocephin and Zithromax for possible pneumonia.   Principal Problem:   Acute hypoxemic respiratory failure (HCC) Active Problems:   Uncomplicated opioid dependence (HCC)   Insomnia   OA (osteoarthritis) of knee   SVT (supraventricular tachycardia) (HCC)   Paroxysmal A-fib (HCC)   Mass of skin of right shoulder   Skin lesion of foot   Chest pain   Hyponatremia   Aspiration pneumonitis (HCC)   Acute on chronic diastolic CHF (congestive heart failure) (HCC)   Effusion of joint of right shoulder   Hypokalemia   Malnutrition of moderate degree   Palliative care encounter   Assessment and Plan: * Acute hypoxemic respiratory failure (HCC) Aspiration pneumonitis. Patient has significant short of breath, copious amount of thick mucus.  He does not seem to have any dysphagia.  Reviewed chest x-ray, posterior infiltrates. Procalcitonin level less than 0.1, patient probably has aspiration pneumonitis instead of bacterial pneumonia.  Continue Rocephin and Flagyl. Start DuoNeb.  Seen by speech therapy, I suspect patient may have silent aspiration, modified barium swallow is scheduled for tomorrow. Continue bronchodilator, I also added Mucomyst, RT eval.   Acute on chronic diastolic congestive heart failure. Ruled in Reviewed echocardiogram performed in January 2024, patient had a moderate pulm hypertension with normal ejection fraction  and grade 2 diastolic dysfunction. Patient has mild elevation in BNP, this is probably triggered by aspiration episode, he received 1 dose IV Lasix, he is currently off oxygen.  Volume status seems to be better.   End-stage multiple myeloma. Right shoulder bony lesion from multiple myeloma. Right shoulder joint effusion. MRI of the right shoulder showed evidence of joint effusion, seen by orthopedics, no concern for septic joint.   Uncomplicated opioid dependence (HCC) Continue with reduced pain medicine.   Chest pain Negative troponin, to be bony pain from multiple myelosis.   Skin lesion of foot Prior wound to the foot, healing.  No infection.   Paroxysmal A-fib (HCC) Continue anticoagulation, heart rate under control.   Insomnia Continue trazodone.   Prognosis. Patient prognosis is guarded, long-term prognosis is poor.  Seen by palliative care from oncology.  Will continue follow-up with oncology to talk about hospice after discharge from hospital.     Subjective:  Patient still has significant short of breath and a cough, large amount of mucus.  Off oxygen.  Physical Exam: Vitals:   11/24/22 2101 11/24/22 2259 11/25/22 0304 11/25/22 0900  BP: 111/66 107/70 109/62 (!) 143/69  Pulse: 99 93 80 (!) 101  Resp: 18 18 18 18   Temp: 98.8 F (37.1 C) 98.7 F (37.1 C) 98.3 F (36.8 C) 98.9 F (37.2 C)  TempSrc: Oral Oral  Oral  SpO2: 98% 96% 99% 98%  Height:       General exam: Appears calm and comfortable  Respiratory system: Significant rhonchi. Respiratory effort normal. Cardiovascular system: S1 & S2 heard, RRR. No JVD, murmurs, rubs, gallops or clicks. No pedal edema. Gastrointestinal system:  Abdomen is nondistended, soft and nontender. No organomegaly or masses felt. Normal bowel sounds heard. Central nervous system: Alert and oriented. No focal neurological deficits. Extremities: Symmetric 5 x 5 power. Skin: No rashes, lesions or ulcers Psychiatry: Judgement and  insight appear normal. Mood & affect appropriate.    Data Reviewed:  Lab results reviewed.  Family Communication:   Disposition: Status is: Inpatient Remains inpatient appropriate because: Severity of disease, IV treatment.     Time spent: 35 minutes  Author: Marrion Coy, MD 11/25/2022 1:37 PM  For on call review [email or web address].

## 2022-11-25 NOTE — Consult Note (Signed)
Palliative Medicine [facility] at [facility] Telephone:([phone]) [phone] [phone]   Name: Dean [name]. Date: 11/25/2022 MRN: [id number]  DOB: 07-12-1935  Patient Care Team: Donita Brooks, MD as PCP - General (Family Medicine) Antony Blackbird, MD as Consulting Physician (Radiation Oncology) West Bali, MD (Inactive) as Consulting Physician (Gastroenterology) Karie Soda, MD as Consulting Physician (General Surgery) Jeralyn Ruths, MD as Consulting Physician (Oncology)    REASON FOR CONSULTATION: Dean Kowalik. is a 87 y.o. male with multiple medical problems including multiple myeloma with disease progression most recently on treatment with Pomalyst.  Patient is felt to not have viable treatment alternatives and hospice has been discussed.  He has had multiple recent hospitalizations, now admitted on 11/23/2022 with pneumonia.  Palliative care was consulted to address goals. .   SOCIAL HISTORY:     reports that he has never smoked. He has never used smokeless tobacco. He reports that he does not drink alcohol and does not use drugs.  Patient lives at home with his daughter. Patient has 2 daughters and a son who lives nearby.  Patient previously worked as a Agricultural consultant.   ADVANCE DIRECTIVES:  On file  CODE STATUS: DNR/DNI  PAST MEDICAL HISTORY: Past Medical History:  Diagnosis Date   BPH (benign prostatic hyperplasia)    Colonic diverticular abscess 01/08/2015   Diverticulitis 12/22/2014   complicated by abscess and required percutaneous drainage   GERD (gastroesophageal reflux disease)    H/O ETOH abuse    History of chemotherapy last done jan 2017   History of radiation therapy 01/05/13-02/10/13   45 gray to left occipital condyle region   History of radiation therapy 12/01/16-12/10/16   Parasternal nodule, chest- 24 Gy total delivered in 8 fractions, Left sacro-iliac, pelvis- 24 Gy total delivered in 8  fractions    History of radiation therapy 02/19/17-02/22/17   right temporal scalp 30 Gy in 10 fractions   History of radiation therapy    Right hip( Pelvis) 11/18/21-11/21/21-Dr. Antony Blackbird   Intra-abdominal abscess (HCC)    Multiple myeloma (HCC) 2015   Neuropathy    Radiation 02/21/14-03/08/14   right posterior chest wall area 30 gray   Radiation 04/19/14-05/02/14   lumbar spine 25 gray   Skull lesion    Left occipital condyle   Wrist fracture, left    x 2    PAST SURGICAL HISTORY:  Past Surgical History:  Procedure Laterality Date   COLONOSCOPY N/A 04/19/2015   Procedure: COLONOSCOPY;  Surgeon: West Bali, MD;  Location: [facility];  Service: Endoscopy;  Laterality: N/A;  1:30 PM   CYST EXCISION  1959   tail bone   CYSTOSCOPY WITH FULGERATION N/A 08/20/2022   Procedure: CYSTOSCOPY WITH FULGERATION;  Surgeon: Crista Elliot, MD;  Location: [facility];  Service: Urology;  Laterality: N/A;  cysto clot evac   IR IMAGING GUIDED PORT INSERTION  09/25/2017   IR REMOVAL TUN ACCESS W/ PORT W/O FL MOD SED  10/21/2022    HEMATOLOGY/ONCOLOGY HISTORY:  Oncology History  Multiple myeloma without remission (HCC)  11/16/2012 Initial Diagnosis   L occipital condyle destructive lesion, not biopsied secondary to location. XRT   12/01/2012 Imaging   L3 superior endplate compression FX, no lytic lesions to suggest myeloma   02/06/2014 Imaging   soft tissue mass along the right posterior fifth rib with some rib destruction   02/16/2014 Miscellaneous   Normal CBC, Normal CMP, kappa,lamda  ratio of 2.62 (abnormal), UIEP with slightly restricted band, IgG kappa, SPEP/IEP with monoclonal protein at 0.79 g/dl, normal igG, suppressed IGM   02/20/2014 Bone Marrow Biopsy   33% kappa restricted plasma cells Normal FISH, normal cytogenetics   02/21/2014 - 03/08/2014 Radiation Therapy   30Gy to R rib lesion, lesion not biopsied   03/16/2014 PET scan   Scattered hypermetabolic osseous lesions,  including the calvarium, ribs, left scapula, sacrum, and left femoral shaft. An expansile left vertebral body lesion at L3 extends into the epidural space of the left lateral recess,    03/17/2014 - 04/07/2014 Chemotherapy   Velcade and Dexamethasone due to initial denial of Revlimid by insurance company.  Zometa monthly.   03/22/2014 Imaging   MR_ L-Spine- Enhancing lesions compatible with multiple myeloma at L2, L3, and S1-2.   04/07/2014 - 07/28/2014 Chemotherapy   RVD with Revlimid at 25 mg days 1-14.  Revlimid dose reduced to 25 mg every other day x 14 days with 7 day respite beginning on day 1 of cycle 2.   04/17/2014 Adverse Reaction   Revlimid-induced rash.  Treated with steroids.   07/28/2014 - 08/04/2014 Chemotherapy   Revlimid daily   08/04/2014 Adverse Reaction   Velcade-induced peripheral neuropathy   08/04/2014 Treatment Plan Change   D/C Velcade   08/11/2014 PET scan   Response to therapy. Improvement and resolution of foci of osseous hypermetabolism.   08/22/2014 - 08/28/2014 Chemotherapy   Revlimid 25 mg days 1-21 every 28 days   08/28/2014 Adverse Reaction   Revlimid-induced rash. Medication held.  Medrol dose Pak prescribed.   09/04/2014 - 01/08/2015 Chemotherapy   Revlimid 10 mg every other day, without dexamethasone   01/08/2015 - 01/15/2015 Hospital Admission   Colonic diverticular abscess with IR drain placement   03/02/2015 PET scan   Only bony uptake is low activity at site of deformity/callus at R second rib FX, high activity in sigmoid colon, advanced sigmoid divertidulosis. Abscess not resolved?   06/04/2015 Imaging   CT nonobstructive L renal calculus, diverticulosis of descending and sigmoid colon without inflammation, moderate prostatic enlargement   07/12/2015 Surgery   Diverticulitis s/p robotic sigmoid colectomy with Dr. Michaell Cowing   08/23/2015 PET scan   Primarily similar hypermetabolic osseous foci within the R sided ribs. new L third rib focus of hypermetab and  sclerosis is favored to be related to healing FX. no soft tissue myeloma id'd. Presumed postop hypermetab and edema about the R pelvic wall    03/06/2016 PET scan   1. Reduced activity in the prior lesion such as the right second and fifth rib lesions, activity nearly completely resolved and significantly less than added mediastinal blood pool activity. No new lesions are identified. 2. Other imaging findings of potential clinical significance: Coronary, aortic arch, and branch vessel atherosclerotic vascular disease. Aortoiliac atherosclerotic vascular disease. Enlarged prostate gland. Colonic diverticula.   02/17/2017 PET scan   HEAD/NECK: No hypermetabolic activity in the scalp. No hypermetabolic cervical lymph nodes. CHEST: No hypermetabolic mediastinal or hilar nodes. Right upper lobe scarring/atelectasis. No suspicious pulmonary nodules on the CT scan. ABDOMEN/PELVIS: No abnormal hypermetabolic activity within the liver, pancreas, adrenal glands, or spleen. No hypermetabolic lymph nodes in the abdomen or pelvis. Atherosclerotic calcifications of the abdominal aorta and branch vessels. Left renal sinus cysts. Sigmoid diverticulosis, without evidence of diverticulitis. Prostatomegaly. SKELETON: Vague hypermetabolism involving the inferior sternum, max SUV 3.2, previously 8.2. Focal hypermetabolism involving the left sacrum, max SUV 4.0, previously 6.7. EXTREMITIES: No abnormal hypermetabolic activity  in the lower extremities.   02/18/2017 -  Chemotherapy   Bortezomib 1.3mg /m2 QWk + Dexamethasone 10mg  QTue/Fri --Cycle #1, 02/18/17    02/18/2017 - 08/26/2017 Chemotherapy   The patient had bortezomib SQ (VELCADE) chemo injection 3 mg, 1.3 mg/m2 = 3 mg, Subcutaneous,  Once, 7 of 9 cycles Administration: 3 mg (02/18/2017), 3 mg (02/26/2017), 3 mg (03/04/2017), 3 mg (03/11/2017), 3 mg (04/08/2017), 3 mg (03/18/2017), 3 mg (03/25/2017), 3 mg (04/01/2017), 3 mg (05/06/2017), 3 mg (05/20/2017), 3 mg (06/03/2017), 3  mg (06/17/2017), 3 mg (07/01/2017), 3 mg (07/15/2017), 3 mg (07/29/2017), 3 mg (08/12/2017), 3 mg (08/26/2017)  for chemotherapy treatment.    09/17/2017 - 10/02/2021 Chemotherapy   Patient is on Treatment Plan : MYELOMA Daratumumab q28d     09/17/2017 - 12/04/2021 Chemotherapy   Patient is on Treatment Plan : MYELOMA Daratumumab SQ q28d       ALLERGIES:  is allergic to codeine, morphine and codeine, and revlimid [lenalidomide].  MEDICATIONS:  Current Facility-Administered Medications  Medication Dose Route Frequency Provider Last Rate Last Admin   acetaminophen (TYLENOL) tablet 650 mg  650 mg Oral Q6H PRN Cox, Amy N, DO   650 mg at 11/24/22 0915   Or   acetaminophen (TYLENOL) suppository 650 mg  650 mg Rectal Q6H PRN Cox, Amy N, DO       acetylcysteine (MUCOMYST) 20 % nebulizer / oral solution 4 mL  4 mL Nebulization BID Marrion Coy, MD   4 mL at 11/24/22 2005   apixaban (ELIQUIS) tablet 5 mg  5 mg Oral BID Cox, Amy N, DO   5 mg at 11/25/22 0944   cefTRIAXone (ROCEPHIN) 2 g in sodium chloride 0.9 % 100 mL IVPB  2 g Intravenous Q24H Cox, Amy N, DO 200 mL/hr at 11/25/22 1000 2 g at 11/25/22 1000   cyanocobalamin (VITAMIN B12) tablet 500 mcg  500 mcg Oral Daily Marrion Coy, MD   500 mcg at 11/25/22 0943   feeding supplement (ENSURE ENLIVE / ENSURE PLUS) liquid 237 mL  237 mL Oral TID BM Marrion Coy, MD   237 mL at 11/25/22 1009   ferrous sulfate tablet 325 mg  325 mg Oral Q breakfast Cox, Amy N, DO   325 mg at 11/25/22 0944   folic acid (FOLVITE) tablet 1 mg  1 mg Oral Daily Cox, Amy N, DO   1 mg at 11/25/22 0944   gabapentin (NEURONTIN) capsule 400 mg  400 mg Oral BID Cox, Amy N, DO   400 mg at 11/25/22 0943   guaiFENesin-dextromethorphan (ROBITUSSIN DM) 100-10 MG/5ML syrup 5 mL  5 mL Oral Q4H PRN Cox, Amy N, DO   5 mL at 11/24/22 1745   ipratropium-albuterol (DUONEB) 0.5-2.5 (3) MG/3ML nebulizer solution 3 mL  3 mL Nebulization BID Marrion Coy, MD       melatonin tablet 10 mg  10 mg Oral QHS  Cox, Amy N, DO   10 mg at 11/24/22 2117   metoprolol tartrate (LOPRESSOR) tablet 12.5 mg  12.5 mg Oral BID Marrion Coy, MD   12.5 mg at 11/25/22 0944   metroNIDAZOLE (FLAGYL) IVPB 500 mg  500 mg Intravenous Q12H Cox, Amy N, DO 100 mL/hr at 11/25/22 0244 500 mg at 11/25/22 0244   montelukast (SINGULAIR) tablet 10 mg  10 mg Oral QHS Cox, Amy N, DO   10 mg at 11/24/22 2116   multivitamin with minerals tablet 1 tablet  1 tablet Oral Daily Marrion Coy, MD   1 tablet  at 11/25/22 1009   ondansetron (ZOFRAN) tablet 4 mg  4 mg Oral Q6H PRN Cox, Amy N, DO       Or   ondansetron (ZOFRAN) injection 4 mg  4 mg Intravenous Q6H PRN Cox, Amy N, DO       oxyCODONE (Oxy IR/ROXICODONE) immediate release tablet 5 mg  5 mg Oral TID PRN Jaynie Bream, RPH       senna-docusate (Senokot-S) tablet 1 tablet  1 tablet Oral QHS PRN Cox, Amy N, DO       tamsulosin (FLOMAX) capsule 0.4 mg  0.4 mg Oral QPC supper Cox, Amy N, DO   0.4 mg at 11/24/22 1746   traZODone (DESYREL) tablet 100 mg  100 mg Oral QHS Marrion Coy, MD   100 mg at 11/24/22 2117   Facility-Administered Medications Ordered in Other Encounters  Medication Dose Route Frequency Provider Last Rate Last Admin   heparin lock flush 100 unit/mL  500 Units Intravenous Once Jeralyn Ruths, MD        VITAL SIGNS: BP (!) 143/69 (BP Location: Left Arm)   Pulse (!) 101   Temp 98.9 F (37.2 C) (Oral)   Resp 18   Ht 5\' 8"  (1.727 m)   SpO2 98%   BMI 30.67 kg/m  There were no vitals filed for this visit.  Estimated body mass index is 30.67 kg/m as calculated from the following:   Height as of this encounter: 5\' 8"  (1.727 m).   Weight as of 11/10/22: 201 lb 11.2 oz (91.5 kg).  LABS: CBC:    Component Value Date/Time   WBC 4.9 11/25/2022 0653   HGB 10.0 (L) 11/25/2022 0653   HCT 33.2 (L) 11/25/2022 0653   PLT 143 (L) 11/25/2022 0653   MCV 97.9 11/25/2022 0653   NEUTROABS 3.4 11/10/2022 0914   LYMPHSABS 1.5 11/10/2022 0914   MONOABS 0.4  11/10/2022 0914   EOSABS 0.0 11/10/2022 0914   BASOSABS 0.1 11/10/2022 0914   Comprehensive Metabolic Panel:    Component Value Date/Time   NA 141 11/25/2022 0653   K 3.2 (L) 11/25/2022 0653   CL 108 11/25/2022 0653   CO2 27 11/25/2022 0653   BUN 18 11/25/2022 0653   CREATININE 0.59 (L) 11/25/2022 0653   CREATININE 0.51 (L) 03/20/2022 0914   CREATININE 0.81 03/26/2021 1117   GLUCOSE 87 11/25/2022 0653   CALCIUM 7.8 (L) 11/25/2022 0653   AST 15 11/24/2022 0444   AST 18 03/20/2022 0914   ALT 10 11/24/2022 0444   ALT 13 03/20/2022 0914   ALKPHOS 15 (L) 11/24/2022 0444   BILITOT 0.8 11/24/2022 0444   BILITOT 0.4 03/20/2022 0914   PROT 6.4 (L) 11/24/2022 0444   ALBUMIN 2.0 (L) 11/24/2022 0444    RADIOGRAPHIC STUDIES: MR SHOULDER RIGHT W WO CONTRAST  Result Date: 11/23/2022 CLINICAL DATA:  Soft tissue infection suspected, shoulder, xray done. Multiple myeloma EXAM: MRI OF THE RIGHT SHOULDER WITHOUT AND WITH CONTRAST TECHNIQUE: Multiplanar, multisequence MR imaging of the right shoulder shoulder was performed before and after the administration of intravenous contrast. CONTRAST:  9mL GADAVIST GADOBUTROL 1 MMOL/ML IV SOLN COMPARISON:  CT chest 08/19/2022 FINDINGS: Technical Note: Despite efforts by the technologist and patient, motion artifact is present on today's exam and could not be eliminated. This reduces exam sensitivity and specificity. Rotator cuff: Moderate talar cuff tendinosis with small partial-thickness rim rent tear of the anterior supraspinatus tendon insertion. No full-thickness or retracted rotator cuff tear. Muscles: Generalized muscle atrophy.  Mild supraspinatus and infraspinatus intramuscular edema. Biceps long head: Intra-articular biceps tendon is not identified, likely torn and retracted. Acromioclavicular Joint: Small-moderate acromioclavicular joint effusion with synovial enhancement. Mild bone marrow edema within the distal clavicle and adjacent acromion. Mild  subacromial-subdeltoid bursitis. Glenohumeral Joint: No glenohumeral joint effusion. Moderate diffuse chondral thinning and surface irregularity. Labrum:  Diffusely degenerated. Bones: Numerous marrow replacing bone lesions throughout the imaged scapula and proximal humerus including dominant lesion centered within the posteroinferior glenoid measuring 3.1 x 2.2 x 2.8 cm. There is cortical breakthrough and extraosseous extension of tumor along the posterior glenoid neck. Additional expansile bone lesion within in adjacent upper right rib (series 11, image 17). Shoulder joint intact without fracture or dislocation. Other: No fluid collections about the shoulder. IMPRESSION: 1. Small-moderate acromioclavicular joint effusion with synovial enhancement. Mild bone marrow edema within the distal clavicle and adjacent acromion. Findings are nonspecific and may be degenerative or reactive. Septic arthritis is not excluded in the appropriate clinical setting. Correlate with serum inflammatory markers. 2. Numerous marrow replacing bone lesions throughout the imaged scapula and right proximal humerus including dominant lesion centered within the posteroinferior glenoid measuring 3.1 x 2.2 x 2.8 cm. There is cortical breakthrough and extraosseous extension of tumor along the posterior glenoid neck. Additional expansile bone lesion within an adjacent upper right rib. Findings are compatible with the patient's history of multiple myeloma. 3. Moderate rotator cuff tendinosis with small partial-thickness rim rent tear of the anterior supraspinatus tendon insertion. No full-thickness or retracted rotator cuff tear. 4. Mild subacromial-subdeltoid bursitis. 5. Intra-articular biceps tendon is not identified, likely torn and retracted. Electronically Signed   By: Duanne Guess D.O.   On: 11/23/2022 20:11   DG Chest Portable 1 View  Result Date: 11/23/2022 CLINICAL DATA:  Congestion and weakness EXAM: PORTABLE CHEST 1 VIEW  COMPARISON:  Chest x-ray dated October 18, 2022 FINDINGS: Cardiac and mediastinal contours are unchanged. New left retrocardiac opacity. Small bilateral pleural effusions. Unchanged linear opacity of the right upper lobe, likely due to scarring. Osseous heterogeneity, likely due to underlying metastatic disease. Chronic appearing bilateral rib fractures and old left clavicle fracture. IMPRESSION: 1. New left retrocardiac opacity, may be due to atelectasis, infection or aspiration. 2. Small bilateral pleural effusions. Electronically Signed   By: Allegra Lai M.D.   On: 11/23/2022 11:36   CT HEAD WO CONTRAST ( )  Result Date: 11/23/2022 CLINICAL DATA:  87 year old male with history of confusion and weakness. EXAM: CT HEAD WITHOUT CONTRAST TECHNIQUE: Contiguous axial images were obtained from the base of the skull through the vertex without intravenous contrast. RADIATION DOSE REDUCTION: This exam was performed according to the departmental dose-optimization program which includes automated exposure control, adjustment of the mA and/or kV according to patient size and/or use of iterative reconstruction technique. COMPARISON:  Head CT 10/16/2012. FINDINGS: Brain: Moderate cerebral and cerebellar atrophy. Patchy and confluent areas of decreased attenuation are noted throughout the deep and periventricular white matter of the cerebral hemispheres bilaterally, compatible with chronic microvascular ischemic disease. No evidence of acute infarction, hemorrhage, hydrocephalus, extra-axial collection or mass lesion/mass effect. Vascular: No hyperdense vessel or unexpected calcification. Skull: Numerous well-circumscribed lytic lesions scattered throughout the skull, similar to the prior study. Sinuses/Orbits: No acute finding. Other: None. IMPRESSION: 1. No acute intracranial abnormalities. 2. Moderate cerebral and cerebellar atrophy with chronic microvascular ischemic changes in the cerebral white matter, as  above. 3. Numerous osseous lesions again noted in the skull compatible with reported clinical history of multiple myeloma. Electronically  Signed   By: Trudie Reed M.D.   On: 11/23/2022 08:53    PERFORMANCE STATUS (ECOG) : 4 - Bedbound  Review of Systems Unless otherwise noted, a complete review of systems is negative.  Physical Exam General: NAD Pulmonary: Unlabored Extremities: no edema, no joint deformities Skin: no rashes Neurological: Weakness but otherwise nonfocal  IMPRESSION: Patient is well-known to me from the clinic.  He has progressive myeloma but has been off Pomalyst due to multiple recent hospitalizations and declining performance status.  Patient last saw Dr. Orlie Dakin on 11/10/2022 at which time patient declined hospice and decision to continue holding Pomalyst and Zometa until performance status improved.  Patient had been working with home health PT/OT.  However, he remained mostly sedentary with limited ambulation.  He has moved in to live with his daughter.  Discussed goals today with patient and daughter.  Those have a strong desire to see if patient can improve physically.  However, they verbalized understanding that this may not be an receivable goal.  Patient would desire treatment in the future if he is a candidate.  We again discussed the possibility of hospice involvement.  Will plan outpatient follow-up in the cancer center for further discussions regarding goals.  PLAN: -Continue current scope of treatment -Will plan outpatient follow-up  Case and plan discussed with Dr. Orlie Dakin   Time Total: 45 minutes  Visit consisted of counseling and education dealing with the complex and emotionally intense issues of symptom management and palliative care in the setting of serious and potentially life-threatening illness.Greater than 50%  of this time was spent counseling and coordinating care related to the above assessment and plan.  Signed by: Laurette Schimke,  PhD, NP-C

## 2022-11-25 NOTE — Progress Notes (Signed)
Initial Nutrition Assessment  DOCUMENTATION CODES:   Non-severe (moderate) malnutrition in context of chronic illness  INTERVENTION:   -Obtain new wt -Ensure Enlive po TID, each supplement provides 350 kcal and 20 grams of protein -Magic cup TID with meals, each supplement provides 290 kcal and 9 grams of protein  -MVI with minerals daily  NUTRITION DIAGNOSIS:   Moderate Malnutrition related to chronic illness (multiple myeloma) as evidenced by mild fat depletion, moderate fat depletion, mild muscle depletion, moderate muscle depletion.  GOAL:   Patient will meet greater than or equal to 90% of their needs  MONITOR:   PO intake, Supplement acceptance  REASON FOR ASSESSMENT:   Consult Assessment of nutrition requirement/status  ASSESSMENT:   Pt with history of multiple myeloma, end-stage, hypertension, BPH, insomnia, paroxysmal atrial fibrillation, history of PE, on Eliquis, who presents for chief concerns of weakness for 1 week PTA.  Pt admitted with acute hypoxemic respiratory failure, possible CHF, and uncomplicated opioid dependence.   10/21- s/p BSE- dysphagia 3 diet with thin liquids  Reviewed I/O's: -750 ml x 24 hours and +1.9 L since admission  UOP: 750 ml x 24 hours   Per MD notes, pt was diagnosed with pneumonia PTA and recently completed Z-pack. Pt has been weak over the past week PTA and has worsened over the past 2 days. Pt has also been experiencing constipation.   Per orthopedics notes, pt with rt shoulder pain secondary to multiple myeloma; no evidence of septic arthritis.   Pt sleeping soundly at time of visit. He did not respond to voice or touch. No family present to provide additional history.   Pt currently on a dysphagia 3 diet. No meal completion data available to assess at this time. Per cancer center RD notes, pt was drinking 2 Carnation Instant Breakfast drinks at home.   Reviewed wt hx; last recorded wt on 11/10/22. RD has ordered weight to  better assess wt changes. Noted no weight loss. Pt with mild edema, which may be masking true weight loss as well as fat and muscle depletions.   Per TOC notes, plan home with home health. Pt currently lives with his son and recent completed an SNF stay.   Medications reviewed and include vitamin B-12, folic acid, neurontin, melatonin, and potassium chloride.   Lab Results  Component Value Date   HGBA1C 5.6 07/06/2015   PTA DM medications are none.   Labs reviewed: K: 3.2, CBGS: 103 (inpatient orders for glycemic control are none).    NUTRITION - FOCUSED PHYSICAL EXAM:  Flowsheet Row Most Recent Value  Orbital Region Mild depletion  Upper Arm Region Moderate depletion  Thoracic and Lumbar Region No depletion  Buccal Region Mild depletion  Temple Region Mild depletion  Clavicle Bone Region Moderate depletion  Clavicle and Acromion Bone Region Moderate depletion  Scapular Bone Region Moderate depletion  Dorsal Hand Moderate depletion  Patellar Region Mild depletion  Anterior Thigh Region Mild depletion  Posterior Calf Region Mild depletion  Edema (RD Assessment) Mild  Hair Reviewed  Eyes Reviewed  Mouth Reviewed  Skin Reviewed  Nails Reviewed       Diet Order:   Diet Order             DIET DYS 3 Room service appropriate? Yes; Fluid consistency: Thin  Diet effective now                   EDUCATION NEEDS:   No education needs have been identified at this time  Skin:  Skin Assessment: Reviewed RN Assessment  Last BM:  11/24/22 (type 2)  Height:   Ht Readings from Last 1 Encounters:  11/24/22 5\' 8"  (1.727 m)    Weight:   Wt Readings from Last 1 Encounters:  11/10/22 91.5 kg    Ideal Body Weight:  70 kg  BMI:  Body mass index is 30.67 kg/m.  Estimated Nutritional Needs:   Kcal:  1900-2100  Protein:  90-105 grams  Fluid:  > 1.9 L    Levada Schilling, RD, LDN, CDCES Registered Dietitian III Certified Diabetes Care and Education  Specialist Please refer to [facility] for RD and/or RD on-call/weekend/after hours pager

## 2022-11-25 NOTE — Evaluation (Signed)
Physical Therapy Evaluation Patient Details Name: Dean [name]. MRN: [id number] DOB: [birth year] Today's Date: 11/25/2022  History of Present Illness  Patient is a 87 year old male with history of multiple myeloma, end-stage, hypertension, BPH, insomnia, paroxysmal atrial fibrillation, history of PE, on Eliquis, who presents to the emergency department for chief concerns of weakness for 1 week. Current MD assessment: Acute hypoxic respiratory failure - aspiration pneumonitis, end stage multiple myeloma, skin lesion of foot, chest pain, and R shoulder bony lesion.  Clinical Impression  Pt was pleasant and motivated to participate during the session and put forth good effort throughout. Pt currently needs Max A +2 for all mobility performed today. Pt able to perform x2 STS's with RW, only able to stay standing for ~15 seconds at a time before needing to sit due to pt report LE fatigue. pt able to shuffle towards EOB for a total of 2 feet needing continual support to stay upright and when attempting to advance LE's. Pt will benefit from continued PT services upon discharge to safely address deficits listed in patient problem list for decreased caregiver assistance and eventual return to PLOF.          If plan is discharge home, recommend the following: Assist for transportation;Help with stairs or ramp for entrance;Assistance with cooking/housework;Two people to help with walking and/or transfers;Two people to help with bathing/dressing/bathroom   Can travel by private vehicle   No    Equipment Recommendations None recommended by PT  Recommendations for Other Services       Functional Status Assessment Patient has had a recent decline in their functional status and demonstrates the ability to make significant improvements in function in a reasonable and predictable amount of time.     Precautions / Restrictions Precautions Precautions: Fall Restrictions Weight Bearing Restrictions: No       Mobility  Bed Mobility Overal bed mobility: Needs Assistance Bed Mobility: Supine to Sit, Sit to Supine     Supine to sit: +2 for physical assistance, Max assist Sit to supine: Max assist, +2 for physical assistance        Transfers Overall transfer level: Needs assistance Equipment used: Rolling walker (2 wheels) Transfers: Sit to/from Stand Sit to Stand: +2 physical assistance, Max assist           General transfer comment: Only able to stay standing for ~15 seconds at a time. Pt able to perform x2 STS's    Ambulation/Gait Ambulation/Gait assistance: Max assist Gait Distance (Feet): 2 Feet Assistive device: Rolling walker (2 wheels) Gait Pattern/deviations: Shuffle Gait velocity: decreased     General Gait Details: Pt able to take a few shuffling steps towards [facility]  Stairs            Wheelchair Mobility     Tilt Bed    Modified Rankin (Stroke Patients Only)       Balance Overall balance assessment: Needs assistance Sitting-balance support: Feet supported, Single extremity supported Sitting balance-Leahy Scale: Good     Standing balance support: Reliant on assistive device for balance, Bilateral upper extremity supported, During functional activity Standing balance-Leahy Scale: Poor Standing balance comment: static standing with RW                             Pertinent Vitals/Pain Pain Assessment Pain Assessment: Faces Faces Pain Scale: Hurts a little bit Pain Location: R shoulder Pain Intervention(s): Monitored during session    Home Living Family/patient expects  to be discharged to:: Private residence Living Arrangements: Children Available Help at Discharge: Family;Available 24 hours/day Type of Home: House Home Access: Ramped entrance       Home Layout: One level Home Equipment: Rolling Walker (2 wheels);BSC/3in1;Cane - single point;Wheelchair - manual Additional Comments: Pt living in daughters home    Prior  Function Prior Level of Function : Needs assist;History of Falls (last six months)             Mobility Comments: Needs assist, performed stand pivot to [facility] at most, reports he has not been ambulatory since the end of july, 2024. endorses 3x falls ~5 months ago. ADLs Comments: Needs assist for sponge bath and bathroom use at baseline     Extremity/Trunk Assessment   Upper Extremity Assessment Upper Extremity Assessment: Generalized weakness    Lower Extremity Assessment Lower Extremity Assessment: Generalized weakness       Communication   Communication Communication: Hearing impairment  Cognition Arousal: Alert Behavior During Therapy: WFL for tasks assessed/performed Overall Cognitive Status: Within Functional Limits for tasks assessed                                          General Comments      Exercises     Assessment/Plan    PT Assessment Patient needs continued PT services  PT Problem List Decreased strength;Decreased coordination;Decreased range of motion;Decreased activity tolerance;Decreased balance;Decreased mobility       PT Treatment Interventions DME instruction;Balance training;Gait training;Stair training;Functional mobility training;Therapeutic activities;Therapeutic exercise    PT Goals (Current goals can be found in the Care Plan section)  Acute Rehab PT Goals Patient Stated Goal: walk with RW again PT Goal Formulation: With patient/family Time For Goal Achievement: 12/08/22 Potential to Achieve Goals: Poor    Frequency Min 1X/week     Co-evaluation               AM-PAC PT "6 Clicks" Mobility  Outcome Measure Help needed turning from your back to your side while in a flat bed without using bedrails?: A Lot Help needed moving from lying on your back to sitting on the side of a flat bed without using bedrails?: A Lot Help needed moving to and from a bed to a chair (including a wheelchair)?: Total Help needed  standing up from a chair using your arms (e.g., wheelchair or bedside chair)?: A Lot Help needed to walk in hospital room?: Total Help needed climbing 3-5 steps with a railing? : Total 6 Click Score: 9    End of Session Equipment Utilized During Treatment: Gait belt Activity Tolerance: Patient limited by fatigue Patient left: in bed;with bed alarm set;with family/visitor present;with call bell/phone within reach Nurse Communication: Mobility status PT Visit Diagnosis: Other abnormalities of gait and mobility (R26.89);Muscle weakness (generalized) (M62.81);Difficulty in walking, not elsewhere classified (R26.2);Unsteadiness on feet (R26.81);History of falling (Z91.81)    Time: 2952-8413 PT Time Calculation (min) (ACUTE ONLY): 32 min   Charges:                 Cecile Sheerer, SPT 11/25/22, 12:55 PM

## 2022-11-25 NOTE — Progress Notes (Signed)
OT Cancellation Note  Patient Details Name: Dean [name]. MRN: [id number] DOB: December 15, 1935   Cancelled Treatment:    Reason Eval/Treat Not Completed: Patient declined, no reason specified. Consult received, chart reviewed. Daughter present. Pt endorsing he is "having a bad day." Pt wishes to defer OT evaluation until tomorrow, daughter in agreement. Will re-attempt tomorrow as medically appropriate.   Arman Filter., MPH, MS, OTR/L ascom [phone] 11/25/22, 1:46 PM

## 2022-11-25 NOTE — [place] FL2 (Signed)
[place] MEDICAID FL2 LEVEL OF CARE FORM     IDENTIFICATION  Patient Name: Dean [name]. Birthdate: 08/04/1935 Sex: male Admission Date (Current Location): 11/23/2022  [place] and [place] Number:  Chiropodist and Address:  [facility], [address], [place], [place] [address]      Provider Number: [id number]  Attending Physician Name and Address:  Marrion Coy, MD  Relative Name and Phone Number:  [name] (daughter)  [phone]    Current Level of Care: Hospital Recommended Level of Care: Skilled Nursing Facility Prior Approval Number:    Date Approved/Denied:   PASRR Number: [id number]  Discharge Plan: SNF    Current Diagnoses: Patient Active Problem List   Diagnosis Date Noted   Hypokalemia 11/25/2022   Malnutrition of moderate degree 11/25/2022   Palliative care encounter 11/25/2022   Hyponatremia 11/24/2022   Aspiration pneumonitis (HCC) 11/24/2022   Acute on chronic diastolic CHF (congestive heart failure) (HCC) 11/24/2022   Effusion of joint of right shoulder 11/24/2022   Acute hypoxemic respiratory failure (HCC) 11/23/2022   Mass of skin of right shoulder 11/23/2022   Skin lesion of foot 11/23/2022   Chest pain 11/23/2022   Bacteremia due to Gram-negative bacteria 10/21/2022   Cellulitis of right arm 10/21/2022   Sepsis due to cellulitis (HCC) 10/17/2022   Leukopenia 10/17/2022   Paroxysmal A-fib (HCC) 10/17/2022   Sepsis (HCC) 10/17/2022   Acute hypoxic respiratory failure (HCC) 08/19/2022   PNA (pneumonia) 08/19/2022   Urethral bleeding 08/19/2022   Uncomplicated opioid dependence (HCC) 02/13/2022   SVT (supraventricular tachycardia) (HCC) 02/12/2022   Pulmonary embolism (HCC) 02/10/2022   Acute metabolic encephalopathy 02/10/2022   Pain in left arm 10/16/2021   Closed fracture of left clavicle 08/19/2021   Spinal stenosis, lumbar 10/13/2016   Chronic back pain 10/01/2016   DDD (degenerative disc  disease), lumbar 10/01/2016   OA (osteoarthritis) of knee 10/01/2016   Diverticulitis s/p robotic sigmoid colectomy 07/11/2015 07/11/2015   Goals of care, counseling/discussion    Abnormal CT scan, sigmoid colon 04/02/2015   Abnormal PET scan of colon 04/02/2015   Thrombocytopenia (HCC) 01/09/2015   Multiple myeloma without remission (HCC) 02/15/2014   Allergic rhinitis 01/16/2014   History of vertebral stress fracture 01/16/2014   Dermatitis 01/16/2014   Loss of weight 09/05/2013   Insomnia 09/05/2013   Fatigue 05/16/2013   Neck pain 12/07/2012   Skull lesion 11/29/2012   Neuropathy (HCC)    BPH (benign prostatic hyperplasia)    GERD (gastroesophageal reflux disease)     Orientation RESPIRATION BLADDER Height & Weight     Self, Time  Normal Incontinent, External catheter Weight:   Height:  5\' 8"  (172.7 cm)  BEHAVIORAL SYMPTOMS/MOOD NEUROLOGICAL BOWEL NUTRITION STATUS      Incontinent Diet (see discharge summary)  AMBULATORY STATUS COMMUNICATION OF NEEDS Skin   Extensive Assist Verbally Other (Comment), Skin abrasions (abrasions arms and hands)                       Personal Care Assistance Level of Assistance  Bathing, Dressing, Feeding, Total care Bathing Assistance: Maximum assistance Feeding assistance: Limited assistance Dressing Assistance: Maximum assistance Total Care Assistance: Maximum assistance   Functional Limitations Info  Sight, Hearing, Speech Sight Info: Adequate Hearing Info: Adequate Speech Info: Adequate    SPECIAL CARE FACTORS FREQUENCY  PT (By licensed PT), OT (By licensed OT)     PT Frequency: min 4x weekly OT Frequency: min 4x weekly  Contractures Contractures Info: Not present    Additional Factors Info  Code Status, Allergies Code Status Info: DNR limited Allergies Info: Codeine  Morphine And Codeine  Revlimid (Lenalidomide)           Current Medications (11/25/2022):  This is the current hospital active  medication list Current Facility-Administered Medications  Medication Dose Route Frequency Provider Last Rate Last Admin   acetaminophen (TYLENOL) tablet 650 mg  650 mg Oral Q6H PRN Cox, Amy N, DO   650 mg at 11/24/22 0915   Or   acetaminophen (TYLENOL) suppository 650 mg  650 mg Rectal Q6H PRN Cox, Amy N, DO       acetylcysteine (MUCOMYST) 20 % nebulizer / oral solution 4 mL  4 mL Nebulization BID Marrion Coy, MD   4 mL at 11/24/22 2005   apixaban (ELIQUIS) tablet 5 mg  5 mg Oral BID Cox, Amy N, DO   5 mg at 11/25/22 0944   cefTRIAXone (ROCEPHIN) 2 g in sodium chloride 0.9 % 100 mL IVPB  2 g Intravenous Q24H Cox, Amy N, DO 200 mL/hr at 11/25/22 1000 2 g at 11/25/22 1000   cyanocobalamin (VITAMIN B12) tablet 500 mcg  500 mcg Oral Daily Marrion Coy, MD   500 mcg at 11/25/22 3244   feeding supplement (ENSURE ENLIVE / ENSURE PLUS) liquid 237 mL  237 mL Oral TID BM Marrion Coy, MD   237 mL at 11/25/22 1009   ferrous sulfate tablet 325 mg  325 mg Oral Q breakfast Cox, Amy N, DO   325 mg at 11/25/22 0102   folic acid (FOLVITE) tablet 1 mg  1 mg Oral Daily Cox, Amy N, DO   1 mg at 11/25/22 0944   gabapentin (NEURONTIN) capsule 400 mg  400 mg Oral BID Cox, Amy N, DO   400 mg at 11/25/22 7253   guaiFENesin-dextromethorphan (ROBITUSSIN DM) 100-10 MG/5ML syrup 5 mL  5 mL Oral Q4H PRN Cox, Amy N, DO   5 mL at 11/24/22 1745   ipratropium-albuterol (DUONEB) 0.5-2.5 (3) MG/3ML nebulizer solution 3 mL  3 mL Nebulization BID Marrion Coy, MD       melatonin tablet 10 mg  10 mg Oral QHS Cox, Amy N, DO   10 mg at 11/24/22 2117   metoprolol tartrate (LOPRESSOR) tablet 12.5 mg  12.5 mg Oral BID Marrion Coy, MD   12.5 mg at 11/25/22 0944   metroNIDAZOLE (FLAGYL) IVPB 500 mg  500 mg Intravenous Q12H Cox, Amy N, DO 100 mL/hr at 11/25/22 1428 500 mg at 11/25/22 1428   montelukast (SINGULAIR) tablet 10 mg  10 mg Oral QHS Cox, Amy N, DO   10 mg at 11/24/22 2116   multivitamin with minerals tablet 1 tablet  1 tablet  Oral Daily Marrion Coy, MD   1 tablet at 11/25/22 1009   ondansetron (ZOFRAN) tablet 4 mg  4 mg Oral Q6H PRN Cox, Amy N, DO       Or   ondansetron (ZOFRAN) injection 4 mg  4 mg Intravenous Q6H PRN Cox, Amy N, DO       oxyCODONE (Oxy IR/ROXICODONE) immediate release tablet 5 mg  5 mg Oral TID PRN Jaynie Bream, RPH   5 mg at 11/25/22 1433   senna-docusate (Senokot-S) tablet 1 tablet  1 tablet Oral QHS PRN Cox, Amy N, DO       tamsulosin (FLOMAX) capsule 0.4 mg  0.4 mg Oral QPC supper Cox, Amy N, DO   0.4  mg at 11/24/22 1746   traZODone (DESYREL) tablet 100 mg  100 mg Oral QHS Marrion Coy, MD   100 mg at 11/24/22 2117   Facility-Administered Medications Ordered in Other Encounters  Medication Dose Route Frequency Provider Last Rate Last Admin   heparin lock flush 100 unit/mL  500 Units Intravenous Once Jeralyn Ruths, MD         Discharge Medications: Please see discharge summary for a list of discharge medications.  Relevant Imaging Results:  Relevant Lab Results:   Additional Information SSN [id number]  Darolyn Rua, LCSW

## 2022-11-25 NOTE — TOC Progression Note (Addendum)
Transition of Care (TOC) - Progression Note    Patient Details  Name: Dean [name]. MRN: [id number] Date of Birth: 05-03-1935  Transition of Care [facility]) CM/SW Contact  Darolyn Rua, [place] Phone Number: 11/25/2022, 2:56 PM  Clinical Narrative:     CSW spoke with patient daughter Dean [name] regarding snf recs, open to referrals being sent however does state patient cannot return to [facility] he was there less than 24 hours and left.   Referrals sent out pending bed offers.   Expected Discharge Plan: Home/Self Care (Home with son) Barriers to Discharge: Continued Medical Work up  Expected Discharge Plan and Services       Living arrangements for the past 2 months: Single Family Home (Reports he has been in rehab)                                       Social Determinants of Health (SDOH) Interventions SDOH Screenings   Food Insecurity: No Food Insecurity (11/24/2022)  Housing: Low Risk  (11/24/2022)  Transportation Needs: No Transportation Needs (11/24/2022)  Utilities: Not At Risk (11/24/2022)  Alcohol Screen: Low Risk  (01/09/2022)  Depression (PHQ2-9): Low Risk  (09/16/2022)  Financial Resource Strain: Low Risk  (03/21/2022)  Physical Activity: Inactive (01/09/2022)  Social Connections: Moderately Isolated (01/09/2022)  Stress: Stress Concern Present (01/09/2022)  Tobacco Use: Low Risk  (11/24/2022)    Readmission Risk Interventions    11/24/2022    4:10 PM 08/20/2022    1:21 PM  Readmission Risk Prevention Plan  Transportation Screening Complete Complete  PCP or Specialist Appt within 3-5 Days  Complete  HRI or Home Care Consult  Complete  Social Work Consult for Recovery Care Planning/Counseling  Complete  Palliative Care Screening  Not Applicable  Medication Review Oceanographer) Complete Complete  HRI or Home Care Consult Complete   SW Recovery Care/Counseling Consult Complete   Palliative Care Screening Not Applicable   Skilled Nursing Facility Not  Applicable

## 2022-11-26 ENCOUNTER — Inpatient Hospital Stay: Payer: Medicare Other

## 2022-11-26 DIAGNOSIS — R2231 Localized swelling, mass and lump, right upper limb: Secondary | ICD-10-CM

## 2022-11-26 DIAGNOSIS — J690 Pneumonitis due to inhalation of food and vomit: Secondary | ICD-10-CM | POA: Diagnosis not present

## 2022-11-26 DIAGNOSIS — I480 Paroxysmal atrial fibrillation: Secondary | ICD-10-CM | POA: Diagnosis not present

## 2022-11-26 DIAGNOSIS — E440 Moderate protein-calorie malnutrition: Secondary | ICD-10-CM

## 2022-11-26 DIAGNOSIS — F1120 Opioid dependence, uncomplicated: Secondary | ICD-10-CM

## 2022-11-26 DIAGNOSIS — G4709 Other insomnia: Secondary | ICD-10-CM

## 2022-11-26 DIAGNOSIS — J9601 Acute respiratory failure with hypoxia: Secondary | ICD-10-CM | POA: Diagnosis not present

## 2022-11-26 DIAGNOSIS — E871 Hypo-osmolality and hyponatremia: Secondary | ICD-10-CM

## 2022-11-26 LAB — BASIC METABOLIC PANEL
Anion gap: 5 (ref 5–15)
BUN: 21 mg/dL (ref 8–23)
CO2: 28 mmol/L (ref 22–32)
Calcium: 8.2 mg/dL — ABNORMAL LOW (ref 8.9–10.3)
Chloride: 107 mmol/L (ref 98–111)
Creatinine, Ser: 0.59 mg/dL — ABNORMAL LOW (ref 0.61–1.24)
GFR, Estimated: >60 mL/min (ref >60)
Glucose, Bld: 102 mg/dL — ABNORMAL HIGH (ref 70–99)
Potassium: 3.8 mmol/L (ref 3.5–5.1)
Sodium: 140 mmol/L (ref 135–145)

## 2022-11-26 MED ORDER — ACYCLOVIR 200 MG PO CAPS
400.0000 mg | ORAL_CAPSULE | Freq: Two times a day (BID) | ORAL | Status: DC
Start: 2022-11-26 — End: 2022-11-28
  Administered 2022-11-26 – 2022-11-27 (×3): 400 mg via ORAL
  Filled 2022-11-26 (×4): qty 2

## 2022-11-26 MED ORDER — OXYCODONE HCL 5 MG PO TABS
10.0000 mg | ORAL_TABLET | Freq: Four times a day (QID) | ORAL | Status: DC | PRN
  Administered 2022-11-26 – 2022-11-27 (×2): 10 mg via ORAL
  Filled 2022-11-26 (×2): qty 2

## 2022-11-26 MED ORDER — AMOXICILLIN-POT CLAVULANATE 875-125 MG PO TABS
1.0000 | ORAL_TABLET | Freq: Two times a day (BID) | ORAL | Status: DC
  Administered 2022-11-27: 1 via ORAL
  Filled 2022-11-26: qty 1

## 2022-11-26 MED ORDER — IPRATROPIUM-ALBUTEROL 0.5-2.5 (3) MG/3ML IN SOLN: 3.0000 mL | RESPIRATORY_TRACT | Status: DC | PRN

## 2022-11-26 NOTE — Progress Notes (Signed)
Modified Barium Swallow Study  Patient Details  Name: Dean [name]. MRN: [id number] Date of Birth: 05-Dec-1935  Today's Date: 11/26/2022  Modified Barium Swallow completed.  Full report located under Chart Review in the Imaging Section.  History of Present Illness Pt  is a 87 year old male with history of multiple myeloma, end-stage, hypertension, BPH, insomnia, paroxysmal atrial fibrillation, history of PE, on Eliquis, who presents to the emergency department for chief concerns of weakness for 1 week+ and has been more sedentary.  he was recently being treated for pneumonia.  Per ED note: "Pt is a cancer pt and lives with his daughter. Pt has been sick and taking a z-pack for the last week but is not feeling better. Pt has strong smell of urine per EMS. EMS reports pt is AMS and weak. Pt gets cancer treatment here but has stopped chemo and radiating because he is end stage.".   CT head wo contrast: Read as no acute intracranial abnormalities.  Moderate cerebral and cerebellar atrophy with chronic microvascular ischemic changes in the cerebral white matter.  Numerous osseous lesions again noted in the skull compatible with history of multiple myeloma.    CXR: New left retrocardiac opacity, may be due to atelectasis,  infection or aspiration.  2. Small bilateral pleural effusions.   Clinical Impression Patient presents with functional oropharyngeal swallowing for age. No aspiration nor laryngeal penetration occurred during this study. Pt required some setup support d/t general overall weakness.  Oral stage is characterized by adequate lip closure, bolus preparation and containment, and anterior to posterior transit. Full oral clearing achieved b/t boluses. Min extra time taken for mastication of increased texture appearing d/t Missing Molars/Dentition. Swallow initiation occurs spilling to/half filling the pyriform sinuses w/ thin liquids; the remaining bolus consistencies at/around the  valleculae.   Pharyngeal stage is noted for functional tongue base retraction, adequate hyolaryngeal excursion, and adequate pharyngeal constriction. No significant pharyngeal residue remained post swallow. Epiglottic deflection is complete; there was No laryngeal penetration nor aspiration noted during this study. Pharyngeal stripping wave is complete.  Amplitude/duration of cricopharyngeus opening is WFL. There is adequate/complete clearance through the viewable cervical esophagus. A 13 mm barium tablet was given in Puree(for safer swallowing) which cleared to oropharynx appropriately.   Consistencies tested were thin liquids x2 tsps, 3 cup sips, no sequential sips for general precaution, nectar x1 tsp, 1 cup sip. honey x1 tsp, pudding x1 tsp, regular solid 2 (1/2 graham cracker with pudding), and 13 mm barium tablet with Puree.   Recommend patient continue mech soft diet with thin liquids as orderd at BSE w/ general aspiration precautions including No straw, Single sips/bites, Small bites/sips; general aspiration precautions. Education provided to pt verbally and via reviewing the video together. No further ST services indicated. Factors that may increase risk of adverse event in presence of aspiration [employer] 2021): Poor general health and/or compromised immunity;Frail or deconditioned (Cancer)  Swallow Evaluation Recommendations Recommendations: PO diet PO Diet Recommendation: Dysphagia 3 (Mechanical soft);Thin liquids (Level 0) Liquid Administration via: Cup; NO STRAW USE when drinking Medication Administration: Whole Meds with PUREE Supervision: Patient able to self-feed;Intermittent supervision/cueing for swallowing strategies;Set-up assistance for safety Swallowing strategies  : Minimize environmental distractions;Slow rate;Small bites/sips;Follow solids with liquids Postural changes: Position pt fully upright for meals;Stay upright 30-60 min after meals;Out of bed for meals Oral  care recommendations: Oral care BID (2x/day);Pt independent with oral care (setup support) Recommended consults: Consider dietitian consultation;Consider Palliative care for support  Jerilynn Som, MS, CCC-SLP Speech Language Pathologist Rehab Services; [facility] [phone] (ascom) Annalysa Mohammad 11/26/2022,11:13 AM

## 2022-11-26 NOTE — Progress Notes (Signed)
Physical Therapy Treatment Patient Details Name: Dean [name]. MRN: [id number] DOB: April 24, 1935 Today's Date: 11/26/2022   History of Present Illness Patient is a 87 year old male with history of multiple myeloma, end-stage, hypertension, BPH, insomnia, paroxysmal atrial fibrillation, history of PE, on Eliquis, who presents to the emergency department for chief concerns of weakness for 1 week. Current MD assessment: Acute hypoxic respiratory failure - aspiration pneumonitis, end stage multiple myeloma, skin lesion of foot, chest pain, and R shoulder bony lesion.    PT Comments  Pt was pleasant and motivated to participate during the session and put forth good effort throughout. Pt improving overall today with all aspects of mobility; pt additionally reports feeling better today compared to prior sessions. He is currently Mod A for bed mobility, Min A at most for STS transfers, and Min A during amb mainly for RW management during sharp turns. He was able to perform initial bout of 15 feet with RW, upon reaching door way pt reports his legs feeling weak, once sitting down pt having notable SOB despite SpO2 and HR being WFL. After ~3 min rest break pt able to perform additional bout of amb for ~8 feet before sitting back down. Pt will benefit from continued PT services upon discharge to safely address deficits listed in patient problem list for decreased caregiver assistance and eventual return to PLOF.    If plan is discharge home, recommend the following: Assist for transportation;Help with stairs or ramp for entrance;Assistance with cooking/housework;Two people to help with walking and/or transfers;Two people to help with bathing/dressing/bathroom   Can travel by private vehicle     Yes  Equipment Recommendations  None recommended by PT    Recommendations for Other Services       Precautions / Restrictions Precautions Precautions: Fall Restrictions Weight Bearing Restrictions: No      Mobility  Bed Mobility Overal bed mobility: Needs Assistance Bed Mobility: Supine to Sit, Sit to Supine     Supine to sit: Mod assist Sit to supine: Mod assist (likely min +2)   General bed mobility comments: Pt showing improvement with bed mobility; improved initiation with lower extremity and trunk movement    Transfers Overall transfer level: Needs assistance Equipment used: Rolling walker (2 wheels) Transfers: Sit to/from Stand Sit to Stand: Min assist           General transfer comment: Pt performed x3 total sit to stands during session with initial 2 only needing CGA; VCs given for hand placement and forward lean; pt needing increased assist during final STS transfer likely due to fatigue    Ambulation/Gait Ambulation/Gait assistance: Min assist Gait Distance (Feet): 15 Feet x1, 8 feet x1 Assistive device: Rolling walker (2 wheels) Gait Pattern/deviations: Decreased stride length, Decreased step length - right, Decreased step length - left, Trunk flexed Gait velocity: decreased     General Gait Details: Pt able to perform initial bout resulting in SOB, pt reported "hard" difficulty. Pt performed shorter secondary bout after ~3 min rest break   Stairs             Wheelchair Mobility     Tilt Bed    Modified Rankin (Stroke Patients Only)       Balance Overall balance assessment: Needs assistance Sitting-balance support: Feet supported Sitting balance-Leahy Scale: Good     Standing balance support: Reliant on assistive device for balance, Bilateral upper extremity supported, During functional activity Standing balance-Leahy Scale: Poor Standing balance comment: static standing with RW  Cognition Arousal: Alert Behavior During Therapy: WFL for tasks assessed/performed Overall Cognitive Status: Within Functional Limits for tasks assessed                                           Exercises      General Comments        Pertinent Vitals/Pain Pain Assessment Pain Assessment: Faces Faces Pain Scale: Hurts little more Pain Location: R shoulder radiating towards R hand, aching pain Pain Descriptors / Indicators: Aching Pain Intervention(s): Monitored during session, Premedicated before session    Home Living                          Prior Function            PT Goals (current goals can now be found in the care plan section) Progress towards PT goals: Progressing toward goals    Frequency    Min 1X/week      PT Plan      Co-evaluation              AM-PAC PT "6 Clicks" Mobility   Outcome Measure  Help needed turning from your back to your side while in a flat bed without using bedrails?: A Little Help needed moving from lying on your back to sitting on the side of a flat bed without using bedrails?: A Little Help needed moving to and from a bed to a chair (including a wheelchair)?: A Little Help needed standing up from a chair using your arms (e.g., wheelchair or bedside chair)?: A Little Help needed to walk in hospital room?: A Little Help needed climbing 3-5 steps with a railing? : Total 6 Click Score: 16    End of Session Equipment Utilized During Treatment: Gait belt Activity Tolerance: Patient limited by fatigue;Patient tolerated treatment well Patient left: in bed;with bed alarm set;with family/visitor present;with call bell/phone within reach Nurse Communication: Mobility status (Pt requesting pain meds despite being premedicated prior to session; posible purewick replacement) PT Visit Diagnosis: Other abnormalities of gait and mobility (R26.89);Muscle weakness (generalized) (M62.81);Difficulty in walking, not elsewhere classified (R26.2);Unsteadiness on feet (R26.81);History of falling (Z91.81)     Time: 9147-8295 PT Time Calculation (min) (ACUTE ONLY): 35 min  Charges:                            Cecile Sheerer,  SPT 11/26/22, 5:23 PM

## 2022-11-26 NOTE — Assessment & Plan Note (Signed)
Not sure if this is pneumonia or not.  Procalcitonin negative.  Switch antibiotics over to Augmentin for 1 more day

## 2022-11-26 NOTE — Progress Notes (Signed)
Progress Note   Patient: Dean [name]. XBM:[id number] DOB: April 01, 1935 DOA: 11/23/2022     3 DOS: the patient was seen and examined on 11/26/2022   Brief hospital course: [name] is a 87 year old male with history of multiple myeloma, end-stage, hypertension, BPH, insomnia, paroxysmal atrial fibrillation, history of PE, on Eliquis, who presents to the emergency department for chief concerns of weakness for 1 week. He had a significant cough and shortness of breath.  Hypoxemia, was placed on 2 L oxygen.  Patient was also placed on antibiotics Rocephin and Zithromax for possible pneumonia.  10/23.  Patient on room air.  Not sure if he has pneumonia with negative procalcitonin change antibiotics over to oral for tomorrow.  TOC looking into rehab beds.  Assessment and Plan: * Acute hypoxemic respiratory failure (HCC) Resolved.  Patient on room air.  Not seeing a documentation of a low pulse ox even though he was on oxygen when he came in.  Aspiration pneumonitis (HCC) Not sure if this is pneumonia or not.  Procalcitonin negative.  Switch antibiotics over to Augmentin for 1 more day  Paroxysmal A-fib (HCC) Home Eliquis 5 mg p.o. twice daily resumed  Uncomplicated opioid dependence (HCC) Oxycodone as needed.  Mass of skin of right shoulder Multiple myeloma of the shoulder.  Acute on chronic diastolic CHF (congestive heart failure) (HCC) Patient received 1 dose of Lasix during the hospital course.  Insomnia Trazodone and melatonin  Malnutrition of moderate degree Continue supplements.  Hyponatremia Sodium normal range  Chest pain Troponins negative  Weakness PT recommends rehab        Subjective: Patient does not like the food here.  States he did a little walking with the team today.  Feeling a little stronger.  Did well with the swallow evaluation.  Physical Exam: Vitals:   11/26/22 0702 11/26/22 0734 11/26/22 0746 11/26/22 1138  BP:  109/63   134/72  Pulse:  78  100  Resp:  16  18  Temp:  97.6 F (36.4 C)  98.3 F (36.8 C)  TempSrc:      SpO2:  95% 94% 99%  Weight: 85.2 kg     Height:       Physical Exam HENT:     Head: Normocephalic.     Mouth/Throat:     Pharynx: No oropharyngeal exudate.  Eyes:     General: Lids are normal.     Conjunctiva/sclera: Conjunctivae normal.  Cardiovascular:     Rate and Rhythm: Normal rate and regular rhythm.     Heart sounds: Normal heart sounds, S1 normal and S2 normal.  Pulmonary:     Breath sounds: Transmitted upper airway sounds present. No decreased breath sounds, wheezing, rhonchi or rales.     Comments: After coughing lungs are clear Abdominal:     Palpations: Abdomen is soft.     Tenderness: There is no abdominal tenderness.  Musculoskeletal:     Right lower leg: No swelling.     Left lower leg: No swelling.  Neurological:     Mental Status: He is alert.     Comments: Answers questions appropriately.  Some difficulty with straight leg raise of the left leg.  Does a little bit better with straight leg raise with the right leg.     Data Reviewed: White blood count 4.9, hemoglobin 10.0, platelet count 143, creatinine 0.59, procalcitonin negative.  Family Communication: Spoke with daughter at bedside  Disposition: Status is: Inpatient Remains inpatient appropriate because: TOC  to look into rehab beds.  Planned Discharge Destination: Skilled nursing facility    Time spent: 28 minutes  Author: Alford Highland, MD 11/26/2022 1:06 PM  For on call review [email or web address].

## 2022-11-26 NOTE — Assessment & Plan Note (Signed)
PT recommends rehab

## 2022-11-26 NOTE — TOC Progression Note (Signed)
Transition of Care (TOC) - Progression Note    Patient Details  Name: Dean [name]. MRN: [id number] Date of Birth: 08/16/1935  Transition of Care [facility]) CM/SW Contact  Darolyn Rua, [place] Phone Number: 11/26/2022, 1:13 PM  Clinical Narrative:     CSW provided bed offers to patient and daughter at bedside of [name], they report they would like to hear back from [facility] prior to choosing.   CSW has reached out to Tiffany at [facility] pending response if able to make bed offer.   Expected Discharge Plan: Home/Self Care (Home with son) Barriers to Discharge: Continued Medical Work up  Expected Discharge Plan and Services       Living arrangements for the past 2 months: Single Family Home (Reports he has been in rehab)                                       Social Determinants of Health (SDOH) Interventions SDOH Screenings   Food Insecurity: No Food Insecurity (11/24/2022)  Housing: Low Risk  (11/24/2022)  Transportation Needs: No Transportation Needs (11/24/2022)  Utilities: Not At Risk (11/24/2022)  Alcohol Screen: Low Risk  (01/09/2022)  Depression (PHQ2-9): Low Risk  (09/16/2022)  Financial Resource Strain: Low Risk  (03/21/2022)  Physical Activity: Inactive (01/09/2022)  Social Connections: Moderately Isolated (01/09/2022)  Stress: Stress Concern Present (01/09/2022)  Tobacco Use: Low Risk  (11/24/2022)    Readmission Risk Interventions    11/24/2022    4:10 PM 08/20/2022    1:21 PM  Readmission Risk Prevention Plan  Transportation Screening Complete Complete  PCP or Specialist Appt within 3-5 Days  Complete  HRI or Home Care Consult  Complete  Social Work Consult for Recovery Care Planning/Counseling  Complete  Palliative Care Screening  Not Applicable  Medication Review Oceanographer) Complete Complete  HRI or Home Care Consult Complete   SW Recovery Care/Counseling Consult Complete   Palliative Care  Screening Not Applicable   Skilled Nursing Facility Not Applicable

## 2022-11-26 NOTE — Assessment & Plan Note (Signed)
Sodium normal range ?

## 2022-11-26 NOTE — Assessment & Plan Note (Signed)
Will resume lasix today and continue low dose daily with potasium, on metoprolol

## 2022-11-26 NOTE — Evaluation (Signed)
Occupational Therapy Evaluation Patient Details Name: Dean [name]. MRN: [id number] DOB: Apr 14, 1935 Today's Date: 11/26/2022   History of Present Illness Patient is a 87 year old male with history of multiple myeloma, end-stage, hypertension, BPH, insomnia, paroxysmal atrial fibrillation, history of PE, on Eliquis, who presents to the emergency department for chief concerns of weakness for 1 week. Current MD assessment: Acute hypoxic respiratory failure - aspiration pneumonitis, end stage multiple myeloma, skin lesion of foot, chest pain, and R shoulder bony lesion.   Clinical Impression   Patient received for OT evaluation. See flowsheet below for details of function. Generally, patient requiring MIN A +2 for bed mobility, MIN A-CGA+2 with RW 65ft for functional mobility, and MIN-MAX A for ADLs. Patient will benefit from continued OT while in acute care.       If plan is discharge home, recommend the following: A lot of help with walking and/or transfers;A lot of help with bathing/dressing/bathroom;Assistance with cooking/housework;Direct supervision/assist for medications management;Direct supervision/assist for financial management;Assist for transportation;Help with stairs or ramp for entrance    Functional Status Assessment  Patient has had a recent decline in their functional status and demonstrates the ability to make significant improvements in function in a reasonable and predictable amount of time.  Equipment Recommendations  Other (comment) (defer to next venue of care)    Recommendations for Other Services       Precautions / Restrictions Precautions Precautions: Fall Restrictions Weight Bearing Restrictions: No      Mobility Bed Mobility Overal bed mobility: Needs Assistance Bed Mobility: Supine to Sit     Supine to sit: Min assist, +2 for physical assistance          Transfers Overall transfer level: Needs assistance Equipment used: Rolling walker (2  wheels) Transfers: Sit to/from Stand Sit to Stand: Min assist, +2 physical assistance, From elevated surface           General transfer comment: Improved today from PT eval yesterday. Able to take sidesteps approx 3 feet to the L towards HOB. After seated rest break, able to walk forward 5 feet to recliner with CGA x2 for safety; t/f MIN A to recliner.      Balance Overall balance assessment: Needs assistance Sitting-balance support: Feet supported Sitting balance-Leahy Scale: Good     Standing balance support: Reliant on assistive device for balance, Bilateral upper extremity supported, During functional activity Standing balance-Leahy Scale: Poor                             ADL either performed or assessed with clinical judgement   ADL Overall ADL's : Needs assistance/impaired   Eating/Feeding Details (indicate cue type and reason): anticipate set up from seated Grooming: Set up;Supervision/safety;Sitting;Wash/dry face;Oral care (at EOB)               Lower Body Dressing: Maximal assistance Lower Body Dressing Details (indicate cue type and reason): assistance to don BIL socks today at bed level             Functional mobility during ADLs: Contact guard assist;Cueing for safety;Rolling walker (2 wheels)       Vision Patient Visual Report: No change from baseline       Perception         Praxis         Pertinent Vitals/Pain Pain Assessment Pain Assessment: 0-10 Pain Score:  ("moderate") Pain Location: R shoulder radiating towards R hand, aching pain Pain  Descriptors / Indicators: Aching Pain Intervention(s): Limited activity within patient's tolerance, Monitored during session     Extremity/Trunk Assessment Upper Extremity Assessment Upper Extremity Assessment: Generalized weakness;Right hand dominant;RUE deficits/detail RUE Deficits / Details: pain in RUE pt states from a fall a few months ago. Pt able to use RUE functionally for RW  use and bed mobility though. Hand strength is functional           Communication Communication Communication: Hearing impairment   Cognition Arousal: Alert Behavior During Therapy: WFL for tasks assessed/performed Overall Cognitive Status: Within Functional Limits for tasks assessed                                 General Comments: Not oriented to date (thought it was September), but follows all commands and understands location and situation.     General Comments  Pt on room air throughout session. Participating well.    Exercises     Shoulder Instructions      Home Living Family/patient expects to be discharged to:: Private residence Living Arrangements: Children (adult daughter) Available Help at Discharge: Family;Available 24 hours/day Type of Home: House Home Access: Ramped entrance     Home Layout: One level     Bathroom Shower/Tub: Sponge bathes at baseline   Bathroom Toilet: Standard Bathroom Accessibility: Yes   Home Equipment: Rolling Walker (2 wheels);BSC/3in1;Cane - single point;Wheelchair - manual   Additional Comments: Pt living in daughters home      Prior Functioning/Environment Prior Level of Function : Needs assist;History of Falls (last six months)       Physical Assist : Mobility (physical);ADLs (physical)     Mobility Comments: Needs assist, performed stand pivot to [facility] at most, reports he has not been ambulatory since the end of july, 2024. endorses 3x falls ~5 months ago. ADLs Comments: Needs assist for sponge bath and bathroom use at baseline        OT Problem List: Decreased strength;Decreased activity tolerance;Impaired balance (sitting and/or standing)      OT Treatment/Interventions: Self-care/ADL training;Therapeutic exercise;Therapeutic activities;Patient/family education    OT Goals(Current goals can be found in the care plan section) Acute Rehab OT Goals Patient Stated Goal: Get better; walk more OT Goal  Formulation: With patient Time For Goal Achievement: 12/10/22 Potential to Achieve Goals: Good ADL Goals Pt Will Perform Grooming: with set-up;standing Pt Will Perform Lower Body Dressing: with modified independence;sit to/from stand Pt Will Transfer to Toilet: with modified independence;ambulating;bedside commode Pt Will Perform Toileting - Clothing Manipulation and hygiene: with modified independence;sit to/from stand  OT Frequency: Min 1X/week    Co-evaluation              AM-PAC OT "6 Clicks" Daily Activity     Outcome Measure Help from another person eating meals?: None Help from another person taking care of personal grooming?: A Little Help from another person toileting, which includes using toliet, bedpan, or urinal?: A Lot Help from another person bathing (including washing, rinsing, drying)?: A Lot Help from another person to put on and taking off regular upper body clothing?: A Little Help from another person to put on and taking off regular lower body clothing?: A Lot 6 Click Score: 16   End of Session Equipment Utilized During Treatment: Gait belt;Rolling walker (2 wheels) Nurse Communication: Mobility status  Activity Tolerance: Patient tolerated treatment well Patient left: in chair;with call bell/phone within reach;with chair alarm set  OT  Visit Diagnosis: Unsteadiness on feet (R26.81);Muscle weakness (generalized) (M62.81)                Time: 3474-2595 OT Time Calculation (min): 27 min Charges:  OT General Charges $OT Visit: 1 Visit OT Evaluation $OT Eval Moderate Complexity: 1 Mod OT Treatments $Self Care/Home Management : 8-22 mins  Linward Foster, MS, OTR/L  Alvester Morin 11/26/2022, 1:07 PM

## 2022-11-26 NOTE — Assessment & Plan Note (Signed)
Continue supplements

## 2022-11-27 DIAGNOSIS — M25511 Pain in right shoulder: Secondary | ICD-10-CM | POA: Diagnosis not present

## 2022-11-27 DIAGNOSIS — Z7901 Long term (current) use of anticoagulants: Secondary | ICD-10-CM | POA: Diagnosis not present

## 2022-11-27 DIAGNOSIS — C4911 Malignant neoplasm of connective and soft tissue of right upper limb, including shoulder: Secondary | ICD-10-CM | POA: Diagnosis not present

## 2022-11-27 DIAGNOSIS — J9601 Acute respiratory failure with hypoxia: Secondary | ICD-10-CM | POA: Diagnosis not present

## 2022-11-27 DIAGNOSIS — I10 Essential (primary) hypertension: Secondary | ICD-10-CM | POA: Diagnosis not present

## 2022-11-27 DIAGNOSIS — R0902 Hypoxemia: Secondary | ICD-10-CM | POA: Diagnosis not present

## 2022-11-27 DIAGNOSIS — L97323 Non-pressure chronic ulcer of left ankle with necrosis of muscle: Secondary | ICD-10-CM | POA: Diagnosis not present

## 2022-11-27 DIAGNOSIS — I4891 Unspecified atrial fibrillation: Secondary | ICD-10-CM | POA: Diagnosis not present

## 2022-11-27 DIAGNOSIS — R609 Edema, unspecified: Secondary | ICD-10-CM | POA: Diagnosis not present

## 2022-11-27 DIAGNOSIS — N400 Enlarged prostate without lower urinary tract symptoms: Secondary | ICD-10-CM | POA: Diagnosis not present

## 2022-11-27 DIAGNOSIS — J189 Pneumonia, unspecified organism: Secondary | ICD-10-CM | POA: Diagnosis not present

## 2022-11-27 DIAGNOSIS — L97509 Non-pressure chronic ulcer of other part of unspecified foot with unspecified severity: Secondary | ICD-10-CM | POA: Diagnosis not present

## 2022-11-27 DIAGNOSIS — R1312 Dysphagia, oropharyngeal phase: Secondary | ICD-10-CM | POA: Diagnosis not present

## 2022-11-27 DIAGNOSIS — G893 Neoplasm related pain (acute) (chronic): Secondary | ICD-10-CM | POA: Diagnosis not present

## 2022-11-27 DIAGNOSIS — I110 Hypertensive heart disease with heart failure: Secondary | ICD-10-CM | POA: Diagnosis not present

## 2022-11-27 DIAGNOSIS — E538 Deficiency of other specified B group vitamins: Secondary | ICD-10-CM | POA: Diagnosis not present

## 2022-11-27 DIAGNOSIS — E876 Hypokalemia: Secondary | ICD-10-CM

## 2022-11-27 DIAGNOSIS — L97329 Non-pressure chronic ulcer of left ankle with unspecified severity: Secondary | ICD-10-CM | POA: Diagnosis not present

## 2022-11-27 DIAGNOSIS — Z6830 Body mass index (BMI) 30.0-30.9, adult: Secondary | ICD-10-CM | POA: Diagnosis not present

## 2022-11-27 DIAGNOSIS — G629 Polyneuropathy, unspecified: Secondary | ICD-10-CM | POA: Diagnosis not present

## 2022-11-27 DIAGNOSIS — I471 Supraventricular tachycardia, unspecified: Secondary | ICD-10-CM | POA: Diagnosis not present

## 2022-11-27 DIAGNOSIS — C9000 Multiple myeloma not having achieved remission: Secondary | ICD-10-CM | POA: Diagnosis not present

## 2022-11-27 DIAGNOSIS — G4700 Insomnia, unspecified: Secondary | ICD-10-CM | POA: Diagnosis not present

## 2022-11-27 DIAGNOSIS — I480 Paroxysmal atrial fibrillation: Secondary | ICD-10-CM | POA: Diagnosis not present

## 2022-11-27 DIAGNOSIS — Z743 Need for continuous supervision: Secondary | ICD-10-CM | POA: Diagnosis not present

## 2022-11-27 DIAGNOSIS — N390 Urinary tract infection, site not specified: Secondary | ICD-10-CM | POA: Diagnosis not present

## 2022-11-27 DIAGNOSIS — E440 Moderate protein-calorie malnutrition: Secondary | ICD-10-CM | POA: Diagnosis not present

## 2022-11-27 DIAGNOSIS — F419 Anxiety disorder, unspecified: Secondary | ICD-10-CM | POA: Diagnosis not present

## 2022-11-27 DIAGNOSIS — L89522 Pressure ulcer of left ankle, stage 2: Secondary | ICD-10-CM | POA: Diagnosis not present

## 2022-11-27 DIAGNOSIS — J690 Pneumonitis due to inhalation of food and vomit: Secondary | ICD-10-CM | POA: Diagnosis not present

## 2022-11-27 DIAGNOSIS — M6281 Muscle weakness (generalized): Secondary | ICD-10-CM | POA: Diagnosis not present

## 2022-11-27 DIAGNOSIS — R4182 Altered mental status, unspecified: Secondary | ICD-10-CM | POA: Diagnosis not present

## 2022-11-27 DIAGNOSIS — E871 Hypo-osmolality and hyponatremia: Secondary | ICD-10-CM | POA: Diagnosis not present

## 2022-11-27 DIAGNOSIS — N401 Enlarged prostate with lower urinary tract symptoms: Secondary | ICD-10-CM | POA: Diagnosis not present

## 2022-11-27 DIAGNOSIS — I509 Heart failure, unspecified: Secondary | ICD-10-CM | POA: Diagnosis not present

## 2022-11-27 DIAGNOSIS — I5033 Acute on chronic diastolic (congestive) heart failure: Secondary | ICD-10-CM | POA: Diagnosis not present

## 2022-11-27 MED ORDER — TRAZODONE HCL 300 MG PO TABS: 300.0000 mg | ORAL_TABLET | Freq: Every day | ORAL | 0 refills | Status: DC

## 2022-11-27 MED ORDER — TRAZODONE HCL 100 MG PO TABS: 200.0000 mg | ORAL_TABLET | Freq: Every day | ORAL | Status: DC

## 2022-11-27 MED ORDER — POTASSIUM CHLORIDE CRYS ER 10 MEQ PO TBCR: 10.0000 meq | EXTENDED_RELEASE_TABLET | Freq: Every day | ORAL | 0 refills | Status: DC

## 2022-11-27 MED ORDER — FUROSEMIDE 40 MG PO TABS
40.0000 mg | ORAL_TABLET | Freq: Every day | ORAL | Status: DC
  Administered 2022-11-27: 40 mg via ORAL
  Filled 2022-11-27: qty 1

## 2022-11-27 MED ORDER — GUAIFENESIN-DM 100-10 MG/5ML PO SYRP: 5.0000 mL | ORAL_SOLUTION | ORAL | 0 refills | Status: DC | PRN

## 2022-11-27 MED ORDER — POTASSIUM CHLORIDE CRYS ER 20 MEQ PO TBCR
20.0000 meq | EXTENDED_RELEASE_TABLET | Freq: Every day | ORAL | Status: DC
  Administered 2022-11-27: 20 meq via ORAL
  Filled 2022-11-27: qty 1

## 2022-11-27 MED ORDER — ALPRAZOLAM 1 MG PO TABS: 1.0000 mg | ORAL_TABLET | Freq: Every day | ORAL | 0 refills | Status: DC

## 2022-11-27 MED ORDER — GABAPENTIN 400 MG PO CAPS: 400.0000 mg | ORAL_CAPSULE | Freq: Two times a day (BID) | ORAL | 0 refills | Status: DC

## 2022-11-27 MED ORDER — ALPRAZOLAM 0.5 MG PO TABS
1.0000 mg | ORAL_TABLET | Freq: Every day | ORAL | Status: DC
Start: 2022-11-27 — End: 2022-11-28

## 2022-11-27 MED ORDER — AMOXICILLIN-POT CLAVULANATE 875-125 MG PO TABS: 1.0000 | ORAL_TABLET | Freq: Two times a day (BID) | ORAL | 0 refills | Status: AC

## 2022-11-27 MED ORDER — OXYCODONE HCL 10 MG PO TABS: 10.0000 mg | ORAL_TABLET | Freq: Three times a day (TID) | ORAL | 0 refills | Status: DC | PRN

## 2022-11-27 MED ORDER — ENSURE ENLIVE PO LIQD: 237.0000 mL | Freq: Three times a day (TID) | ORAL | 12 refills | Status: DC

## 2022-11-27 MED ORDER — FUROSEMIDE 20 MG PO TABS: 20.0000 mg | ORAL_TABLET | Freq: Every day | ORAL | 0 refills | Status: DC

## 2022-11-27 NOTE — Progress Notes (Signed)
Occupational Therapy Treatment Patient Details Name: Dean [name]. MRN: [id number] DOB: 13-May-1935 Today's Date: 11/27/2022   History of present illness Patient is a 87 year old male with history of multiple myeloma, end-stage, hypertension, BPH, insomnia, paroxysmal atrial fibrillation, history of PE, on Eliquis, who presents to the emergency department for chief concerns of weakness for 1 week. Current MD assessment: Acute hypoxic respiratory failure - aspiration pneumonitis, end stage multiple myeloma, skin lesion of foot, chest pain, and R shoulder bony lesion.   OT comments  Pt seen for OT tx. Pt politely declines EOB/OOB session 2/2 not sleeping much the night before. Encouraged pt to coordinate with nursing this evening to support improved sleep. Pt endorsing RUE pain and difficulty with self feeding. Pt educated in use of built up handle to improve grasp and stabilization while self feeding using R UE. Pt able to return demo proper technique and able to don/doff foam handle from utensil and adjusts positioning to his liking. Pt educated in alternative options (eg weighted utensils) if tremors are worse and encouraged alternative food types/textures and use of larger spoon or using LUE if RUE is too pain limited. Pt verbalized understanding and appreciative of handle to trial with next meal. Pt continues to benefit from skilled OT services.       If plan is discharge home, recommend the following:  A lot of help with walking and/or transfers;A lot of help with bathing/dressing/bathroom;Assistance with cooking/housework;Direct supervision/assist for medications management;Direct supervision/assist for financial management;Assist for transportation;Help with stairs or ramp for entrance   Equipment Recommendations  Other (comment) (defer to next venue)    Recommendations for Other Services      Precautions / Restrictions Precautions Precautions: Fall Restrictions Weight Bearing  Restrictions: No       Mobility Bed Mobility               General bed mobility comments: Pt declined 2/2 limited sleep previous night (notes he didn't get to sleep until 4am)    Transfers                         Balance                                           ADL either performed or assessed with clinical judgement   ADL Overall ADL's : Needs assistance/impaired Eating/Feeding: With adaptive utensils;Set up Eating/Feeding Details (indicate cue type and reason): Pt educated in use of built up handle to improve grasp and stabilization while self feeding using R UE. Pt able to return demo proper technique and able to don/doff from utensil and adjust positioning to his liking. Pt educated in alternative options (eg weighted utensils) if tremors are worse and encouraged alternative food types/textures and use of larger spoon or using LUE if RUE is too pain limited.                                        Extremity/Trunk Assessment              Vision       Perception     Praxis      Cognition Arousal: Alert Behavior During Therapy: WFL for tasks assessed/performed Overall Cognitive Status: Within Functional Limits for tasks assessed  Exercises      Shoulder Instructions       General Comments      Pertinent Vitals/ Pain       Pain Assessment Pain Assessment: Faces Faces Pain Scale: Hurts little more Pain Location: R shoulder radiating towards R hand, aching pain Pain Descriptors / Indicators: Aching Pain Intervention(s): Monitored during session, Premedicated before session, Repositioned  Home Living                                          Prior Functioning/Environment              Frequency  Min 1X/week        Progress Toward Goals  OT Goals(current goals can now be found in the care plan section)  Progress  towards OT goals: Progressing toward goals  Acute Rehab OT Goals Patient Stated Goal: get better, walk more OT Goal Formulation: With patient Time For Goal Achievement: 12/10/22 Potential to Achieve Goals: Good  Plan      Co-evaluation                 AM-PAC OT "6 Clicks" Daily Activity     Outcome Measure   Help from another person eating meals?: None (improved with AE) Help from another person taking care of personal grooming?: A Little Help from another person toileting, which includes using toliet, bedpan, or urinal?: A Lot Help from another person bathing (including washing, rinsing, drying)?: A Lot Help from another person to put on and taking off regular upper body clothing?: A Little Help from another person to put on and taking off regular lower body clothing?: A Lot 6 Click Score: 16    End of Session    OT Visit Diagnosis: Unsteadiness on feet (R26.81);Muscle weakness (generalized) (M62.81)   Activity Tolerance Patient limited by fatigue   Patient Left in bed;with call bell/phone within reach;with bed alarm set   Nurse Communication          Time: 9563-8756 OT Time Calculation (min): 9 min  Charges: OT General Charges $OT Visit: 1 Visit OT Treatments $Self Care/Home Management : 8-22 mins  Arman Filter., MPH, MS, OTR/L ascom [phone] 11/27/22, 10:32 AM

## 2022-11-27 NOTE — TOC Transition Note (Signed)
Transition of Care [facility]) - CM/SW Discharge Note   Patient Details  Name: Dean [name]. MRN: [id number] Date of Birth: 27-Nov-1935  [facility] CM/SW Contact:  Darolyn Rua, LCSW Phone Number: 11/27/2022, 11:10 AM   Clinical Narrative:     Patient will DC to: [facility] Anticipated DC date: 11/27/22 Family notified: daughter [name] Transport byWendie Simmer  Per MD patient ready for DC to [facility]. RN, patient, patient's family, and facility notified of DC. Discharge Summary sent to facility. RN given number for report [phone]. DC packet on chart. Ambulance transport requested for patient.  CSW signing off.    Final next level of care: Skilled Nursing Facility Barriers to Discharge: No Barriers Identified   Patient Goals and CMS Choice CMS Medicare.gov Compare Post Acute Care list provided to:: Patient Choice offered to / list presented to : Patient  Discharge Placement                Patient [facility] bed at: [facility] Patient to be transferred to facility by: [facility]   Patient and family notified of of transfer: 11/27/22  Discharge Plan and Services Additional resources added to the After Visit Summary for                                       Social Determinants of Health (SDOH) Interventions SDOH Screenings   Food Insecurity: No Food Insecurity (11/24/2022)  Housing: Low Risk  (11/24/2022)  Transportation Needs: No Transportation Needs (11/24/2022)  Utilities: Not At Risk (11/24/2022)  Alcohol Screen: Low Risk  (01/09/2022)  Depression (PHQ2-9): Low Risk  (09/16/2022)  Financial Resource Strain: Low Risk  (03/21/2022)  Physical Activity: Inactive (01/09/2022)  Social Connections: Moderately Isolated (01/09/2022)  Stress: Stress Concern Present (01/09/2022)  Tobacco Use: Low Risk  (11/24/2022)     Readmission Risk Interventions    11/24/2022    4:10 PM 08/20/2022    1:21 PM  Readmission Risk  Prevention Plan  Transportation Screening Complete Complete  PCP or Specialist Appt within 3-5 Days  Complete  HRI or Home Care Consult  Complete  Social Work Consult for Recovery Care Planning/Counseling  Complete  Palliative Care Screening  Not Applicable  Medication Review Oceanographer) Complete Complete  HRI or Home Care Consult Complete   SW Recovery Care/Counseling Consult Complete   Palliative Care Screening Not Applicable   Skilled Nursing Facility Not Applicable

## 2022-11-27 NOTE — Discharge Summary (Signed)
below for reference.   Imaging Studies: DG Swallowing Func-Speech Pathology  Result Date: 11/26/2022 Table formatting from the original result was not included. Modified Barium Swallow Study Patient Details Name: Dean [name]. MRN: [id number] Date of Birth: [birth year] Today's Date: 11/26/2022 HPI/PMH: Pt  is a 87 year old male with history of multiple myeloma, end-stage, hypertension, BPH, insomnia, paroxysmal atrial fibrillation, history of PE, on Eliquis, who presents to the emergency department for chief concerns of weakness for 1 week+ and has been more sedentary.  he was recently being treated for pneumonia.  Per ED note: "Pt is a cancer pt and lives with his daughter. Pt has been sick and taking a z-pack for the last week but is not feeling better. Pt has strong smell of urine per EMS. EMS reports pt is AMS and weak. Pt gets cancer treatment here but has stopped chemo and radiating because he is end stage.".  CT head wo contrast: Read as no acute intracranial abnormalities.  Moderate cerebral and cerebellar atrophy with chronic  microvascular ischemic changes in the cerebral white matter.  Numerous osseous lesions again noted in the skull compatible with history of multiple myeloma.   CXR: New left retrocardiac opacity, may be due to atelectasis,  infection or aspiration.  2. Small bilateral pleural effusions. Clinical Impression Patient presents with functional oropharyngeal swallowing for age. No aspiration nor laryngeal penetration occurred during this study. Pt required some setup support d/t general overall weakness. Oral stage is characterized by adequate lip closure, bolus preparation and containment, and anterior to posterior transit. Full oral clearing achieved b/t boluses. Min extra time taken for mastication of increased texture appearing d/t Missing Molars/Dentition. Swallow initiation occurs spilling to/half filling the pyriform sinuses w/ thin liquids; the remaining bolus consistencies at/around the valleculae.  Pharyngeal stage is noted for functional tongue base retraction, adequate hyolaryngeal excursion, and adequate pharyngeal constriction. No significant pharyngeal residue remained post swallow. Epiglottic deflection is complete; there was No laryngeal penetration nor aspiration noted during this study. Pharyngeal stripping wave is complete. Amplitude/duration of cricopharyngeus opening is WFL. There is adequate/complete clearance through the viewable cervical esophagus. A 13 mm barium tablet was given in Puree(for safer swallowing) which cleared to oropharynx appropriately. Consistencies tested were thin liquids x2 tsps, 3 cup sips, no sequential sips for general precaution, nectar x1 tsp, 1 cup sip. honey x1 tsp, pudding x1 tsp, regular solid 2 (1/2 graham cracker with pudding), and 13 mm barium tablet with Puree.  Recommend patient continue mech soft diet with thin liquids as orderd at BSE w/ general aspiration precautions including No straw, Single sips/bites, Small bites/sips; general aspiration precautions.  Education provided to pt verbally and via reviewing the video together. No further ST services indicated. Factors that may increase risk of adverse event in presence of aspiration Rubye Oaks & Clearance Coots 2021): Poor general health and/or compromised immunity;Frail or deconditioned (Cancer) Swallow Evaluation Recommendations Recommendations: PO diet PO Diet Recommendation: Dysphagia 3 (Mechanical soft);Thin liquids (Level 0) Liquid Administration via: Cup; NO STRAW USE when drinking Medication Administration: Whole Meds with PUREE Supervision: Patient able to self-feed;Intermittent supervision/cueing for swallowing strategies;Set-up assistance for safety Swallowing strategies: Minimize environmental distractions;Slow rate;Small bites/sips;Follow solids with liquids Postural changes: Position pt fully upright for meals;Stay upright 30-60 min after meals;Out of bed for meals Oral care recommendations: Oral care BID (2x/day);Pt independent with oral care (setup support) Recommended consults: Consider dietitian consultation;Consider Palliative care for support Treatment Plan Treatment Plan Treatment recommendations: No treatment recommended at this time Follow-up recommendations: No  below for reference.   Imaging Studies: DG Swallowing Func-Speech Pathology  Result Date: 11/26/2022 Table formatting from the original result was not included. Modified Barium Swallow Study Patient Details Name: Dean [name]. MRN: [id number] Date of Birth: [birth year] Today's Date: 11/26/2022 HPI/PMH: Pt  is a 87 year old male with history of multiple myeloma, end-stage, hypertension, BPH, insomnia, paroxysmal atrial fibrillation, history of PE, on Eliquis, who presents to the emergency department for chief concerns of weakness for 1 week+ and has been more sedentary.  he was recently being treated for pneumonia.  Per ED note: "Pt is a cancer pt and lives with his daughter. Pt has been sick and taking a z-pack for the last week but is not feeling better. Pt has strong smell of urine per EMS. EMS reports pt is AMS and weak. Pt gets cancer treatment here but has stopped chemo and radiating because he is end stage.".  CT head wo contrast: Read as no acute intracranial abnormalities.  Moderate cerebral and cerebellar atrophy with chronic  microvascular ischemic changes in the cerebral white matter.  Numerous osseous lesions again noted in the skull compatible with history of multiple myeloma.   CXR: New left retrocardiac opacity, may be due to atelectasis,  infection or aspiration.  2. Small bilateral pleural effusions. Clinical Impression Patient presents with functional oropharyngeal swallowing for age. No aspiration nor laryngeal penetration occurred during this study. Pt required some setup support d/t general overall weakness. Oral stage is characterized by adequate lip closure, bolus preparation and containment, and anterior to posterior transit. Full oral clearing achieved b/t boluses. Min extra time taken for mastication of increased texture appearing d/t Missing Molars/Dentition. Swallow initiation occurs spilling to/half filling the pyriform sinuses w/ thin liquids; the remaining bolus consistencies at/around the valleculae.  Pharyngeal stage is noted for functional tongue base retraction, adequate hyolaryngeal excursion, and adequate pharyngeal constriction. No significant pharyngeal residue remained post swallow. Epiglottic deflection is complete; there was No laryngeal penetration nor aspiration noted during this study. Pharyngeal stripping wave is complete. Amplitude/duration of cricopharyngeus opening is WFL. There is adequate/complete clearance through the viewable cervical esophagus. A 13 mm barium tablet was given in Puree(for safer swallowing) which cleared to oropharynx appropriately. Consistencies tested were thin liquids x2 tsps, 3 cup sips, no sequential sips for general precaution, nectar x1 tsp, 1 cup sip. honey x1 tsp, pudding x1 tsp, regular solid 2 (1/2 graham cracker with pudding), and 13 mm barium tablet with Puree.  Recommend patient continue mech soft diet with thin liquids as orderd at BSE w/ general aspiration precautions including No straw, Single sips/bites, Small bites/sips; general aspiration precautions.  Education provided to pt verbally and via reviewing the video together. No further ST services indicated. Factors that may increase risk of adverse event in presence of aspiration Rubye Oaks & Clearance Coots 2021): Poor general health and/or compromised immunity;Frail or deconditioned (Cancer) Swallow Evaluation Recommendations Recommendations: PO diet PO Diet Recommendation: Dysphagia 3 (Mechanical soft);Thin liquids (Level 0) Liquid Administration via: Cup; NO STRAW USE when drinking Medication Administration: Whole Meds with PUREE Supervision: Patient able to self-feed;Intermittent supervision/cueing for swallowing strategies;Set-up assistance for safety Swallowing strategies: Minimize environmental distractions;Slow rate;Small bites/sips;Follow solids with liquids Postural changes: Position pt fully upright for meals;Stay upright 30-60 min after meals;Out of bed for meals Oral care recommendations: Oral care BID (2x/day);Pt independent with oral care (setup support) Recommended consults: Consider dietitian consultation;Consider Palliative care for support Treatment Plan Treatment Plan Treatment recommendations: No treatment recommended at this time Follow-up recommendations: No  Physician Discharge Summary   Patient: Dean [name]. MRN: [id number] DOB: 1935-04-05  Admit date:     11/23/2022  Discharge date: 11/27/22  Discharge Physician: Alford Highland   PCP: Donita Brooks, MD   Recommendations at discharge:    Follow up with team at rehab one day  Discharge Diagnoses: Principal Problem:   Acute hypoxemic respiratory failure (HCC) Active Problems:   Aspiration pneumonitis (HCC)   Uncomplicated opioid dependence (HCC)   Paroxysmal A-fib (HCC)   Mass of skin of right shoulder   Acute on chronic diastolic CHF (congestive heart failure) (HCC)   Insomnia   Weakness   OA (osteoarthritis) of knee   SVT (supraventricular tachycardia) (HCC)   Chest pain   Hyponatremia   Effusion of joint of right shoulder   Hypokalemia   Malnutrition of moderate degree   Palliative care encounter   Hospital Course: Dean [name] is a 87 year old male with history of multiple myeloma, end-stage, hypertension, BPH, insomnia, paroxysmal atrial fibrillation, history of PE, on Eliquis, who presents to the emergency department for chief concerns of weakness for 1 week. He had a significant cough and shortness of breath.  Hypoxemia, was placed on 2 L oxygen.  Patient was also placed on antibiotics Rocephin and Zithromax for possible pneumonia.  10/23.  Patient on room air.  Not sure if he has pneumonia with negative procalcitonin change antibiotics over to oral for tomorrow.  TOC looking into rehab beds. 10/24.  Patient did not sleep very well, states he need the melatonin, 300mg  trazadone and 1mg  of xanax too sleep.  Feels tired this am.  Assessment and Plan: * Acute hypoxemic respiratory failure (HCC) Resolved.  Patient on room air.  Not seeing a documentation of a low pulse ox even though he was on oxygen when he came in.  Aspiration pneumonitis (HCC) Not sure if this is pneumonia or not.  Procalcitonin negative.  Last dose of Augmentin  tonight.  Paroxysmal A-fib (HCC) Continue Eliquis 5 mg p.o. twice daily resumed  Uncomplicated opioid dependence (HCC) Oxycodone as needed.  Mass of skin of right shoulder Multiple myeloma of the shoulder.  Acute on chronic diastolic CHF (congestive heart failure) (HCC) Will resume lasix today and continue low dose daily with potasium, on metoprolol  Insomnia Trazodone 300mg  nightly, xanax 1mg  nightly and melatonin  Malnutrition of moderate degree Continue supplements.  Hyponatremia Sodium normal range  Chest pain Troponins negative  Weakness PT recommends rehab        Consultants: none Procedures performed: none Disposition: Rehabilitation facility Diet recommendation:  Cardiac diet dysphagia 3 with thin liquids DISCHARGE MEDICATION: Allergies as of 11/27/2022       Reactions   Codeine Other (See Comments)   Headache   Morphine And Codeine Other (See Comments)   Makes him feel weird   Revlimid [lenalidomide] Other (See Comments)   "Causes me to become weak"        Medication List     STOP taking these medications    azithromycin 250 MG tablet Commonly known as: ZITHROMAX   lidocaine-prilocaine cream Commonly known as: EMLA   pomalidomide 3 MG capsule Commonly known as: POMALYST   predniSONE 20 MG tablet Commonly known as: DELTASONE   TURMERIC PO       TAKE these medications    acetaminophen 500 MG tablet Commonly known as: TYLENOL Take 500-1,000 mg by mouth every 6 (six) hours as needed for mild pain or moderate pain.   acyclovir 400  below for reference.   Imaging Studies: DG Swallowing Func-Speech Pathology  Result Date: 11/26/2022 Table formatting from the original result was not included. Modified Barium Swallow Study Patient Details Name: Dean [name]. MRN: [id number] Date of Birth: [birth year] Today's Date: 11/26/2022 HPI/PMH: Pt  is a 87 year old male with history of multiple myeloma, end-stage, hypertension, BPH, insomnia, paroxysmal atrial fibrillation, history of PE, on Eliquis, who presents to the emergency department for chief concerns of weakness for 1 week+ and has been more sedentary.  he was recently being treated for pneumonia.  Per ED note: "Pt is a cancer pt and lives with his daughter. Pt has been sick and taking a z-pack for the last week but is not feeling better. Pt has strong smell of urine per EMS. EMS reports pt is AMS and weak. Pt gets cancer treatment here but has stopped chemo and radiating because he is end stage.".  CT head wo contrast: Read as no acute intracranial abnormalities.  Moderate cerebral and cerebellar atrophy with chronic  microvascular ischemic changes in the cerebral white matter.  Numerous osseous lesions again noted in the skull compatible with history of multiple myeloma.   CXR: New left retrocardiac opacity, may be due to atelectasis,  infection or aspiration.  2. Small bilateral pleural effusions. Clinical Impression Patient presents with functional oropharyngeal swallowing for age. No aspiration nor laryngeal penetration occurred during this study. Pt required some setup support d/t general overall weakness. Oral stage is characterized by adequate lip closure, bolus preparation and containment, and anterior to posterior transit. Full oral clearing achieved b/t boluses. Min extra time taken for mastication of increased texture appearing d/t Missing Molars/Dentition. Swallow initiation occurs spilling to/half filling the pyriform sinuses w/ thin liquids; the remaining bolus consistencies at/around the valleculae.  Pharyngeal stage is noted for functional tongue base retraction, adequate hyolaryngeal excursion, and adequate pharyngeal constriction. No significant pharyngeal residue remained post swallow. Epiglottic deflection is complete; there was No laryngeal penetration nor aspiration noted during this study. Pharyngeal stripping wave is complete. Amplitude/duration of cricopharyngeus opening is WFL. There is adequate/complete clearance through the viewable cervical esophagus. A 13 mm barium tablet was given in Puree(for safer swallowing) which cleared to oropharynx appropriately. Consistencies tested were thin liquids x2 tsps, 3 cup sips, no sequential sips for general precaution, nectar x1 tsp, 1 cup sip. honey x1 tsp, pudding x1 tsp, regular solid 2 (1/2 graham cracker with pudding), and 13 mm barium tablet with Puree.  Recommend patient continue mech soft diet with thin liquids as orderd at BSE w/ general aspiration precautions including No straw, Single sips/bites, Small bites/sips; general aspiration precautions.  Education provided to pt verbally and via reviewing the video together. No further ST services indicated. Factors that may increase risk of adverse event in presence of aspiration Rubye Oaks & Clearance Coots 2021): Poor general health and/or compromised immunity;Frail or deconditioned (Cancer) Swallow Evaluation Recommendations Recommendations: PO diet PO Diet Recommendation: Dysphagia 3 (Mechanical soft);Thin liquids (Level 0) Liquid Administration via: Cup; NO STRAW USE when drinking Medication Administration: Whole Meds with PUREE Supervision: Patient able to self-feed;Intermittent supervision/cueing for swallowing strategies;Set-up assistance for safety Swallowing strategies: Minimize environmental distractions;Slow rate;Small bites/sips;Follow solids with liquids Postural changes: Position pt fully upright for meals;Stay upright 30-60 min after meals;Out of bed for meals Oral care recommendations: Oral care BID (2x/day);Pt independent with oral care (setup support) Recommended consults: Consider dietitian consultation;Consider Palliative care for support Treatment Plan Treatment Plan Treatment recommendations: No treatment recommended at this time Follow-up recommendations: No  Physician Discharge Summary   Patient: Dean [name]. MRN: [id number] DOB: 1935-04-05  Admit date:     11/23/2022  Discharge date: 11/27/22  Discharge Physician: Alford Highland   PCP: Donita Brooks, MD   Recommendations at discharge:    Follow up with team at rehab one day  Discharge Diagnoses: Principal Problem:   Acute hypoxemic respiratory failure (HCC) Active Problems:   Aspiration pneumonitis (HCC)   Uncomplicated opioid dependence (HCC)   Paroxysmal A-fib (HCC)   Mass of skin of right shoulder   Acute on chronic diastolic CHF (congestive heart failure) (HCC)   Insomnia   Weakness   OA (osteoarthritis) of knee   SVT (supraventricular tachycardia) (HCC)   Chest pain   Hyponatremia   Effusion of joint of right shoulder   Hypokalemia   Malnutrition of moderate degree   Palliative care encounter   Hospital Course: Dean [name] is a 87 year old male with history of multiple myeloma, end-stage, hypertension, BPH, insomnia, paroxysmal atrial fibrillation, history of PE, on Eliquis, who presents to the emergency department for chief concerns of weakness for 1 week. He had a significant cough and shortness of breath.  Hypoxemia, was placed on 2 L oxygen.  Patient was also placed on antibiotics Rocephin and Zithromax for possible pneumonia.  10/23.  Patient on room air.  Not sure if he has pneumonia with negative procalcitonin change antibiotics over to oral for tomorrow.  TOC looking into rehab beds. 10/24.  Patient did not sleep very well, states he need the melatonin, 300mg  trazadone and 1mg  of xanax too sleep.  Feels tired this am.  Assessment and Plan: * Acute hypoxemic respiratory failure (HCC) Resolved.  Patient on room air.  Not seeing a documentation of a low pulse ox even though he was on oxygen when he came in.  Aspiration pneumonitis (HCC) Not sure if this is pneumonia or not.  Procalcitonin negative.  Last dose of Augmentin  tonight.  Paroxysmal A-fib (HCC) Continue Eliquis 5 mg p.o. twice daily resumed  Uncomplicated opioid dependence (HCC) Oxycodone as needed.  Mass of skin of right shoulder Multiple myeloma of the shoulder.  Acute on chronic diastolic CHF (congestive heart failure) (HCC) Will resume lasix today and continue low dose daily with potasium, on metoprolol  Insomnia Trazodone 300mg  nightly, xanax 1mg  nightly and melatonin  Malnutrition of moderate degree Continue supplements.  Hyponatremia Sodium normal range  Chest pain Troponins negative  Weakness PT recommends rehab        Consultants: none Procedures performed: none Disposition: Rehabilitation facility Diet recommendation:  Cardiac diet dysphagia 3 with thin liquids DISCHARGE MEDICATION: Allergies as of 11/27/2022       Reactions   Codeine Other (See Comments)   Headache   Morphine And Codeine Other (See Comments)   Makes him feel weird   Revlimid [lenalidomide] Other (See Comments)   "Causes me to become weak"        Medication List     STOP taking these medications    azithromycin 250 MG tablet Commonly known as: ZITHROMAX   lidocaine-prilocaine cream Commonly known as: EMLA   pomalidomide 3 MG capsule Commonly known as: POMALYST   predniSONE 20 MG tablet Commonly known as: DELTASONE   TURMERIC PO       TAKE these medications    acetaminophen 500 MG tablet Commonly known as: TYLENOL Take 500-1,000 mg by mouth every 6 (six) hours as needed for mild pain or moderate pain.   acyclovir 400  Physician Discharge Summary   Patient: Dean [name]. MRN: [id number] DOB: 1935-04-05  Admit date:     11/23/2022  Discharge date: 11/27/22  Discharge Physician: Alford Highland   PCP: Donita Brooks, MD   Recommendations at discharge:    Follow up with team at rehab one day  Discharge Diagnoses: Principal Problem:   Acute hypoxemic respiratory failure (HCC) Active Problems:   Aspiration pneumonitis (HCC)   Uncomplicated opioid dependence (HCC)   Paroxysmal A-fib (HCC)   Mass of skin of right shoulder   Acute on chronic diastolic CHF (congestive heart failure) (HCC)   Insomnia   Weakness   OA (osteoarthritis) of knee   SVT (supraventricular tachycardia) (HCC)   Chest pain   Hyponatremia   Effusion of joint of right shoulder   Hypokalemia   Malnutrition of moderate degree   Palliative care encounter   Hospital Course: Dean [name] is a 87 year old male with history of multiple myeloma, end-stage, hypertension, BPH, insomnia, paroxysmal atrial fibrillation, history of PE, on Eliquis, who presents to the emergency department for chief concerns of weakness for 1 week. He had a significant cough and shortness of breath.  Hypoxemia, was placed on 2 L oxygen.  Patient was also placed on antibiotics Rocephin and Zithromax for possible pneumonia.  10/23.  Patient on room air.  Not sure if he has pneumonia with negative procalcitonin change antibiotics over to oral for tomorrow.  TOC looking into rehab beds. 10/24.  Patient did not sleep very well, states he need the melatonin, 300mg  trazadone and 1mg  of xanax too sleep.  Feels tired this am.  Assessment and Plan: * Acute hypoxemic respiratory failure (HCC) Resolved.  Patient on room air.  Not seeing a documentation of a low pulse ox even though he was on oxygen when he came in.  Aspiration pneumonitis (HCC) Not sure if this is pneumonia or not.  Procalcitonin negative.  Last dose of Augmentin  tonight.  Paroxysmal A-fib (HCC) Continue Eliquis 5 mg p.o. twice daily resumed  Uncomplicated opioid dependence (HCC) Oxycodone as needed.  Mass of skin of right shoulder Multiple myeloma of the shoulder.  Acute on chronic diastolic CHF (congestive heart failure) (HCC) Will resume lasix today and continue low dose daily with potasium, on metoprolol  Insomnia Trazodone 300mg  nightly, xanax 1mg  nightly and melatonin  Malnutrition of moderate degree Continue supplements.  Hyponatremia Sodium normal range  Chest pain Troponins negative  Weakness PT recommends rehab        Consultants: none Procedures performed: none Disposition: Rehabilitation facility Diet recommendation:  Cardiac diet dysphagia 3 with thin liquids DISCHARGE MEDICATION: Allergies as of 11/27/2022       Reactions   Codeine Other (See Comments)   Headache   Morphine And Codeine Other (See Comments)   Makes him feel weird   Revlimid [lenalidomide] Other (See Comments)   "Causes me to become weak"        Medication List     STOP taking these medications    azithromycin 250 MG tablet Commonly known as: ZITHROMAX   lidocaine-prilocaine cream Commonly known as: EMLA   pomalidomide 3 MG capsule Commonly known as: POMALYST   predniSONE 20 MG tablet Commonly known as: DELTASONE   TURMERIC PO       TAKE these medications    acetaminophen 500 MG tablet Commonly known as: TYLENOL Take 500-1,000 mg by mouth every 6 (six) hours as needed for mild pain or moderate pain.   acyclovir 400  below for reference.   Imaging Studies: DG Swallowing Func-Speech Pathology  Result Date: 11/26/2022 Table formatting from the original result was not included. Modified Barium Swallow Study Patient Details Name: Dean [name]. MRN: [id number] Date of Birth: [birth year] Today's Date: 11/26/2022 HPI/PMH: Pt  is a 87 year old male with history of multiple myeloma, end-stage, hypertension, BPH, insomnia, paroxysmal atrial fibrillation, history of PE, on Eliquis, who presents to the emergency department for chief concerns of weakness for 1 week+ and has been more sedentary.  he was recently being treated for pneumonia.  Per ED note: "Pt is a cancer pt and lives with his daughter. Pt has been sick and taking a z-pack for the last week but is not feeling better. Pt has strong smell of urine per EMS. EMS reports pt is AMS and weak. Pt gets cancer treatment here but has stopped chemo and radiating because he is end stage.".  CT head wo contrast: Read as no acute intracranial abnormalities.  Moderate cerebral and cerebellar atrophy with chronic  microvascular ischemic changes in the cerebral white matter.  Numerous osseous lesions again noted in the skull compatible with history of multiple myeloma.   CXR: New left retrocardiac opacity, may be due to atelectasis,  infection or aspiration.  2. Small bilateral pleural effusions. Clinical Impression Patient presents with functional oropharyngeal swallowing for age. No aspiration nor laryngeal penetration occurred during this study. Pt required some setup support d/t general overall weakness. Oral stage is characterized by adequate lip closure, bolus preparation and containment, and anterior to posterior transit. Full oral clearing achieved b/t boluses. Min extra time taken for mastication of increased texture appearing d/t Missing Molars/Dentition. Swallow initiation occurs spilling to/half filling the pyriform sinuses w/ thin liquids; the remaining bolus consistencies at/around the valleculae.  Pharyngeal stage is noted for functional tongue base retraction, adequate hyolaryngeal excursion, and adequate pharyngeal constriction. No significant pharyngeal residue remained post swallow. Epiglottic deflection is complete; there was No laryngeal penetration nor aspiration noted during this study. Pharyngeal stripping wave is complete. Amplitude/duration of cricopharyngeus opening is WFL. There is adequate/complete clearance through the viewable cervical esophagus. A 13 mm barium tablet was given in Puree(for safer swallowing) which cleared to oropharynx appropriately. Consistencies tested were thin liquids x2 tsps, 3 cup sips, no sequential sips for general precaution, nectar x1 tsp, 1 cup sip. honey x1 tsp, pudding x1 tsp, regular solid 2 (1/2 graham cracker with pudding), and 13 mm barium tablet with Puree.  Recommend patient continue mech soft diet with thin liquids as orderd at BSE w/ general aspiration precautions including No straw, Single sips/bites, Small bites/sips; general aspiration precautions.  Education provided to pt verbally and via reviewing the video together. No further ST services indicated. Factors that may increase risk of adverse event in presence of aspiration Rubye Oaks [employer] Clearance Coots 2021): Poor general health and/or compromised immunity;Frail or deconditioned (Cancer) Swallow Evaluation Recommendations Recommendations: PO diet PO Diet Recommendation: Dysphagia 3 (Mechanical soft);Thin liquids (Level 0) Liquid Administration via: Cup; NO STRAW USE when drinking Medication Administration: Whole Meds with PUREE Supervision: Patient able to self-feed;Intermittent supervision/cueing for swallowing strategies;Set-up assistance for safety Swallowing strategies: Minimize environmental distractions;Slow rate;Small bites/sips;Follow solids with liquids Postural changes: Position pt fully upright for meals;Stay upright 30-60 min after meals;Out of bed for meals Oral care recommendations: Oral care BID (2x/day);Pt independent with oral care (setup support) Recommended consults: Consider dietitian consultation;Consider Palliative care for support Treatment Plan Treatment Plan Treatment recommendations: No treatment recommended at this time Follow-up recommendations: No  below for reference.   Imaging Studies: DG Swallowing Func-Speech Pathology  Result Date: 11/26/2022 Table formatting from the original result was not included. Modified Barium Swallow Study Patient Details Name: Dean [name]. MRN: [id number] Date of Birth: [birth year] Today's Date: 11/26/2022 HPI/PMH: Pt  is a 87 year old male with history of multiple myeloma, end-stage, hypertension, BPH, insomnia, paroxysmal atrial fibrillation, history of PE, on Eliquis, who presents to the emergency department for chief concerns of weakness for 1 week+ and has been more sedentary.  he was recently being treated for pneumonia.  Per ED note: "Pt is a cancer pt and lives with his daughter. Pt has been sick and taking a z-pack for the last week but is not feeling better. Pt has strong smell of urine per EMS. EMS reports pt is AMS and weak. Pt gets cancer treatment here but has stopped chemo and radiating because he is end stage.".  CT head wo contrast: Read as no acute intracranial abnormalities.  Moderate cerebral and cerebellar atrophy with chronic  microvascular ischemic changes in the cerebral white matter.  Numerous osseous lesions again noted in the skull compatible with history of multiple myeloma.   CXR: New left retrocardiac opacity, may be due to atelectasis,  infection or aspiration.  2. Small bilateral pleural effusions. Clinical Impression Patient presents with functional oropharyngeal swallowing for age. No aspiration nor laryngeal penetration occurred during this study. Pt required some setup support d/t general overall weakness. Oral stage is characterized by adequate lip closure, bolus preparation and containment, and anterior to posterior transit. Full oral clearing achieved b/t boluses. Min extra time taken for mastication of increased texture appearing d/t Missing Molars/Dentition. Swallow initiation occurs spilling to/half filling the pyriform sinuses w/ thin liquids; the remaining bolus consistencies at/around the valleculae.  Pharyngeal stage is noted for functional tongue base retraction, adequate hyolaryngeal excursion, and adequate pharyngeal constriction. No significant pharyngeal residue remained post swallow. Epiglottic deflection is complete; there was No laryngeal penetration nor aspiration noted during this study. Pharyngeal stripping wave is complete. Amplitude/duration of cricopharyngeus opening is WFL. There is adequate/complete clearance through the viewable cervical esophagus. A 13 mm barium tablet was given in Puree(for safer swallowing) which cleared to oropharynx appropriately. Consistencies tested were thin liquids x2 tsps, 3 cup sips, no sequential sips for general precaution, nectar x1 tsp, 1 cup sip. honey x1 tsp, pudding x1 tsp, regular solid 2 (1/2 graham cracker with pudding), and 13 mm barium tablet with Puree.  Recommend patient continue mech soft diet with thin liquids as orderd at BSE w/ general aspiration precautions including No straw, Single sips/bites, Small bites/sips; general aspiration precautions.  Education provided to pt verbally and via reviewing the video together. No further ST services indicated. Factors that may increase risk of adverse event in presence of aspiration Rubye Oaks [employer] Clearance Coots 2021): Poor general health and/or compromised immunity;Frail or deconditioned (Cancer) Swallow Evaluation Recommendations Recommendations: PO diet PO Diet Recommendation: Dysphagia 3 (Mechanical soft);Thin liquids (Level 0) Liquid Administration via: Cup; NO STRAW USE when drinking Medication Administration: Whole Meds with PUREE Supervision: Patient able to self-feed;Intermittent supervision/cueing for swallowing strategies;Set-up assistance for safety Swallowing strategies: Minimize environmental distractions;Slow rate;Small bites/sips;Follow solids with liquids Postural changes: Position pt fully upright for meals;Stay upright 30-60 min after meals;Out of bed for meals Oral care recommendations: Oral care BID (2x/day);Pt independent with oral care (setup support) Recommended consults: Consider dietitian consultation;Consider Palliative care for support Treatment Plan Treatment Plan Treatment recommendations: No treatment recommended at this time Follow-up recommendations: No

## 2022-11-27 NOTE — TOC Progression Note (Signed)
Transition of Care (TOC) - Progression Note    Patient Details  Name: Dean [name]. MRN: [id number] Date of Birth: 11-07-1935  Transition of Care [facility]) CM/SW Contact  Darolyn Rua, [place] Phone Number: 11/27/2022, 10:20 AM  Clinical Narrative:     [facility] made bed offer, Daughter accepted, Tiffany at [facility] reports bed avail today. MD and RN notified.    Expected Discharge Plan: Home/Self Care (Home with son) Barriers to Discharge: Continued Medical Work up  Expected Discharge Plan and Services       Living arrangements for the past 2 months: Single Family Home (Reports he has been in rehab)                                       Social Determinants of Health (SDOH) Interventions SDOH Screenings   Food Insecurity: No Food Insecurity (11/24/2022)  Housing: Low Risk  (11/24/2022)  Transportation Needs: No Transportation Needs (11/24/2022)  Utilities: Not At Risk (11/24/2022)  Alcohol Screen: Low Risk  (01/09/2022)  Depression (PHQ2-9): Low Risk  (09/16/2022)  Financial Resource Strain: Low Risk  (03/21/2022)  Physical Activity: Inactive (01/09/2022)  Social Connections: Moderately Isolated (01/09/2022)  Stress: Stress Concern Present (01/09/2022)  Tobacco Use: Low Risk  (11/24/2022)    Readmission Risk Interventions    11/24/2022    4:10 PM 08/20/2022    1:21 PM  Readmission Risk Prevention Plan  Transportation Screening Complete Complete  PCP or Specialist Appt within 3-5 Days  Complete  HRI or Home Care Consult  Complete  Social Work Consult for Recovery Care Planning/Counseling  Complete  Palliative Care Screening  Not Applicable  Medication Review Oceanographer) Complete Complete  HRI or Home Care Consult Complete   SW Recovery Care/Counseling Consult Complete   Palliative Care Screening Not Applicable   Skilled Nursing Facility Not Applicable

## 2022-11-27 NOTE — Progress Notes (Signed)
EMS is here to transport pt to [facility].

## 2022-11-27 NOTE — Progress Notes (Signed)
Report given to Scientist, physiological at [facility]. Pt will transport via ems. Daughter at bedside.

## 2022-11-27 NOTE — Plan of Care (Signed)

## 2022-11-28 LAB — CULTURE, BLOOD (ROUTINE X 2)
Culture: NO GROWTH
Culture: NO GROWTH
Special Requests: ADEQUATE
Special Requests: ADEQUATE

## 2022-12-01 DIAGNOSIS — J690 Pneumonitis due to inhalation of food and vomit: Secondary | ICD-10-CM | POA: Diagnosis not present

## 2022-12-01 DIAGNOSIS — G629 Polyneuropathy, unspecified: Secondary | ICD-10-CM | POA: Diagnosis not present

## 2022-12-01 DIAGNOSIS — I4891 Unspecified atrial fibrillation: Secondary | ICD-10-CM | POA: Diagnosis not present

## 2022-12-01 DIAGNOSIS — I509 Heart failure, unspecified: Secondary | ICD-10-CM | POA: Diagnosis not present

## 2022-12-01 DIAGNOSIS — F419 Anxiety disorder, unspecified: Secondary | ICD-10-CM | POA: Diagnosis not present

## 2022-12-01 DIAGNOSIS — I110 Hypertensive heart disease with heart failure: Secondary | ICD-10-CM | POA: Diagnosis not present

## 2022-12-01 DIAGNOSIS — E871 Hypo-osmolality and hyponatremia: Secondary | ICD-10-CM | POA: Diagnosis not present

## 2022-12-01 DIAGNOSIS — E440 Moderate protein-calorie malnutrition: Secondary | ICD-10-CM | POA: Diagnosis not present

## 2022-12-01 DIAGNOSIS — J9601 Acute respiratory failure with hypoxia: Secondary | ICD-10-CM | POA: Diagnosis not present

## 2022-12-01 DIAGNOSIS — G4700 Insomnia, unspecified: Secondary | ICD-10-CM | POA: Diagnosis not present

## 2022-12-01 DIAGNOSIS — N401 Enlarged prostate with lower urinary tract symptoms: Secondary | ICD-10-CM | POA: Diagnosis not present

## 2022-12-01 DIAGNOSIS — M6281 Muscle weakness (generalized): Secondary | ICD-10-CM | POA: Diagnosis not present

## 2022-12-03 DIAGNOSIS — N401 Enlarged prostate with lower urinary tract symptoms: Secondary | ICD-10-CM | POA: Diagnosis not present

## 2022-12-03 DIAGNOSIS — I509 Heart failure, unspecified: Secondary | ICD-10-CM | POA: Diagnosis not present

## 2022-12-03 DIAGNOSIS — I4891 Unspecified atrial fibrillation: Secondary | ICD-10-CM | POA: Diagnosis not present

## 2022-12-03 DIAGNOSIS — G629 Polyneuropathy, unspecified: Secondary | ICD-10-CM | POA: Diagnosis not present

## 2022-12-03 DIAGNOSIS — J9601 Acute respiratory failure with hypoxia: Secondary | ICD-10-CM | POA: Diagnosis not present

## 2022-12-03 DIAGNOSIS — J189 Pneumonia, unspecified organism: Secondary | ICD-10-CM | POA: Diagnosis not present

## 2022-12-03 DIAGNOSIS — E871 Hypo-osmolality and hyponatremia: Secondary | ICD-10-CM | POA: Diagnosis not present

## 2022-12-03 DIAGNOSIS — M6281 Muscle weakness (generalized): Secondary | ICD-10-CM | POA: Diagnosis not present

## 2022-12-03 DIAGNOSIS — G4700 Insomnia, unspecified: Secondary | ICD-10-CM | POA: Diagnosis not present

## 2022-12-03 DIAGNOSIS — I110 Hypertensive heart disease with heart failure: Secondary | ICD-10-CM | POA: Diagnosis not present

## 2022-12-05 DIAGNOSIS — C4911 Malignant neoplasm of connective and soft tissue of right upper limb, including shoulder: Secondary | ICD-10-CM | POA: Diagnosis not present

## 2022-12-05 DIAGNOSIS — N401 Enlarged prostate with lower urinary tract symptoms: Secondary | ICD-10-CM | POA: Diagnosis not present

## 2022-12-05 DIAGNOSIS — G4700 Insomnia, unspecified: Secondary | ICD-10-CM | POA: Diagnosis not present

## 2022-12-05 DIAGNOSIS — M25511 Pain in right shoulder: Secondary | ICD-10-CM | POA: Diagnosis not present

## 2022-12-05 DIAGNOSIS — I509 Heart failure, unspecified: Secondary | ICD-10-CM | POA: Diagnosis not present

## 2022-12-05 DIAGNOSIS — M6281 Muscle weakness (generalized): Secondary | ICD-10-CM | POA: Diagnosis not present

## 2022-12-05 DIAGNOSIS — J9601 Acute respiratory failure with hypoxia: Secondary | ICD-10-CM | POA: Diagnosis not present

## 2022-12-05 DIAGNOSIS — J189 Pneumonia, unspecified organism: Secondary | ICD-10-CM | POA: Diagnosis not present

## 2022-12-05 DIAGNOSIS — I4891 Unspecified atrial fibrillation: Secondary | ICD-10-CM | POA: Diagnosis not present

## 2022-12-08 DIAGNOSIS — M6281 Muscle weakness (generalized): Secondary | ICD-10-CM | POA: Diagnosis not present

## 2022-12-08 DIAGNOSIS — N401 Enlarged prostate with lower urinary tract symptoms: Secondary | ICD-10-CM | POA: Diagnosis not present

## 2022-12-08 DIAGNOSIS — I4891 Unspecified atrial fibrillation: Secondary | ICD-10-CM | POA: Diagnosis not present

## 2022-12-08 DIAGNOSIS — J189 Pneumonia, unspecified organism: Secondary | ICD-10-CM | POA: Diagnosis not present

## 2022-12-08 DIAGNOSIS — R4182 Altered mental status, unspecified: Secondary | ICD-10-CM | POA: Diagnosis not present

## 2022-12-08 DIAGNOSIS — I509 Heart failure, unspecified: Secondary | ICD-10-CM | POA: Diagnosis not present

## 2022-12-08 DIAGNOSIS — J9601 Acute respiratory failure with hypoxia: Secondary | ICD-10-CM | POA: Diagnosis not present

## 2022-12-08 DIAGNOSIS — L89522 Pressure ulcer of left ankle, stage 2: Secondary | ICD-10-CM | POA: Diagnosis not present

## 2022-12-08 DIAGNOSIS — G4700 Insomnia, unspecified: Secondary | ICD-10-CM | POA: Diagnosis not present

## 2022-12-10 DIAGNOSIS — R609 Edema, unspecified: Secondary | ICD-10-CM | POA: Diagnosis not present

## 2022-12-10 DIAGNOSIS — M6281 Muscle weakness (generalized): Secondary | ICD-10-CM | POA: Diagnosis not present

## 2022-12-10 DIAGNOSIS — J189 Pneumonia, unspecified organism: Secondary | ICD-10-CM | POA: Diagnosis not present

## 2022-12-10 DIAGNOSIS — R4182 Altered mental status, unspecified: Secondary | ICD-10-CM | POA: Diagnosis not present

## 2022-12-10 DIAGNOSIS — C4911 Malignant neoplasm of connective and soft tissue of right upper limb, including shoulder: Secondary | ICD-10-CM | POA: Diagnosis not present

## 2022-12-10 DIAGNOSIS — I4891 Unspecified atrial fibrillation: Secondary | ICD-10-CM | POA: Diagnosis not present

## 2022-12-10 DIAGNOSIS — L97323 Non-pressure chronic ulcer of left ankle with necrosis of muscle: Secondary | ICD-10-CM | POA: Diagnosis not present

## 2022-12-10 DIAGNOSIS — J9601 Acute respiratory failure with hypoxia: Secondary | ICD-10-CM | POA: Diagnosis not present

## 2022-12-11 ENCOUNTER — Inpatient Hospital Stay: Payer: Medicare Other | Admitting: Oncology

## 2022-12-11 ENCOUNTER — Inpatient Hospital Stay: Payer: Medicare Other | Attending: Oncology | Admitting: Hospice and Palliative Medicine

## 2022-12-11 DIAGNOSIS — C9000 Multiple myeloma not having achieved remission: Secondary | ICD-10-CM | POA: Diagnosis not present

## 2022-12-11 DIAGNOSIS — Z515 Encounter for palliative care: Secondary | ICD-10-CM

## 2022-12-11 DIAGNOSIS — G893 Neoplasm related pain (acute) (chronic): Secondary | ICD-10-CM | POA: Diagnosis not present

## 2022-12-11 MED ORDER — OXYCODONE HCL 10 MG PO TABS: 10.0000 mg | ORAL_TABLET | Freq: Four times a day (QID) | ORAL | 0 refills | Status: DC | PRN

## 2022-12-11 MED ORDER — OXYCODONE HCL ER 10 MG PO T12A: 10.0000 mg | EXTENDED_RELEASE_TABLET | Freq: Two times a day (BID) | ORAL | 0 refills | Status: DC

## 2022-12-11 NOTE — Progress Notes (Signed)
Virtual Visit via Telephone Note  I connected with Dean [name]. on 12/11/22 at  1:30 PM EST by telephone and verified that I am speaking with the correct person using two identifiers.  Location: Patient: Home Provider: Clinic   I discussed the limitations, risks, security and privacy concerns of performing an evaluation and management service by telephone and the availability of in person appointments. I also discussed with the patient that there may be a patient responsible charge related to this service. The patient expressed understanding and agreed to proceed.   History of Present Illness: Dean [name]. is a 87 y.o. male with multiple medical problems including multiple myeloma with disease progression currently on treatment.  Patient has had significant pain and declining performance status.  He has had recurrent falls including previous hip fracture.  Palliative care was consulted to address goals.    Observations/Objective: I spoke with patient and daughter by phone.  Patient is at rehab -[facility].  He is working with PT but daughter feels like he is worse with progressive weakness compared to when he discharged from the hospital.  She says that they are treating him again for pneumonia and urinary tract infection.  Reportedly, pain has been poorly controlled.  Oxycodone has been changed to as needed and patient is having difficulty with getting the facility to administer it.  Discussed starting patient on long-acting oxycodone.  Both patient and daughter felt like this was a good idea.  Will send prescriptions to SNF.  Also strongly encouraged daughter to request a palliative care consult at [facility].  She says that patient still desires ongoing rehab and future cancer treatment.  Assessment and Plan: Multiple myeloma -currently with poor performance status at rehab.  Unclear if, when he will be a candidate for treatment, although patient and family still  strongly desires  Neoplasm related pain -start OxyContin 10 mg every 12 hours.  Continue oxycodone IR every 6 hours as needed   Follow Up Instructions: Follow up telephone visit in 1-2 months   I discussed the assessment and treatment plan with the patient. The patient was provided an opportunity to ask questions and all were answered. The patient agreed with the plan and demonstrated an understanding of the instructions.   The patient was advised to call back or seek an in-person evaluation if the symptoms worsen or if the condition fails to improve as anticipated.  I provided 10 minutes of non-face-to-face time during this encounter.   Malachy Moan, NP

## 2022-12-12 DIAGNOSIS — N390 Urinary tract infection, site not specified: Secondary | ICD-10-CM | POA: Diagnosis not present

## 2022-12-12 DIAGNOSIS — C4911 Malignant neoplasm of connective and soft tissue of right upper limb, including shoulder: Secondary | ICD-10-CM | POA: Diagnosis not present

## 2022-12-12 DIAGNOSIS — J189 Pneumonia, unspecified organism: Secondary | ICD-10-CM | POA: Diagnosis not present

## 2022-12-12 DIAGNOSIS — R609 Edema, unspecified: Secondary | ICD-10-CM | POA: Diagnosis not present

## 2022-12-12 DIAGNOSIS — I4891 Unspecified atrial fibrillation: Secondary | ICD-10-CM | POA: Diagnosis not present

## 2022-12-12 DIAGNOSIS — L97323 Non-pressure chronic ulcer of left ankle with necrosis of muscle: Secondary | ICD-10-CM | POA: Diagnosis not present

## 2022-12-12 DIAGNOSIS — M6281 Muscle weakness (generalized): Secondary | ICD-10-CM | POA: Diagnosis not present

## 2022-12-15 DIAGNOSIS — Z6830 Body mass index (BMI) 30.0-30.9, adult: Secondary | ICD-10-CM | POA: Diagnosis not present

## 2022-12-15 DIAGNOSIS — L97329 Non-pressure chronic ulcer of left ankle with unspecified severity: Secondary | ICD-10-CM | POA: Diagnosis not present

## 2022-12-15 DIAGNOSIS — L97509 Non-pressure chronic ulcer of other part of unspecified foot with unspecified severity: Secondary | ICD-10-CM | POA: Diagnosis not present

## 2022-12-15 DIAGNOSIS — E440 Moderate protein-calorie malnutrition: Secondary | ICD-10-CM | POA: Diagnosis not present

## 2022-12-19 ENCOUNTER — Emergency Department: Payer: Medicare Other

## 2022-12-19 ENCOUNTER — Inpatient Hospital Stay
Admission: EM | Admit: 2022-12-19 | Discharge: 2022-12-24 | DRG: 689 | Disposition: A | Discharge status: Skilled Nursing Facility | Payer: Medicare Other | Source: Skilled Nursing Facility | Attending: Internal Medicine | Admitting: Internal Medicine

## 2022-12-19 ENCOUNTER — Other Ambulatory Visit: Payer: Self-pay

## 2022-12-19 DIAGNOSIS — L89152 Pressure ulcer of sacral region, stage 2: Secondary | ICD-10-CM | POA: Diagnosis present

## 2022-12-19 DIAGNOSIS — Z9221 Personal history of antineoplastic chemotherapy: Secondary | ICD-10-CM | POA: Diagnosis not present

## 2022-12-19 DIAGNOSIS — G4700 Insomnia, unspecified: Secondary | ICD-10-CM | POA: Diagnosis not present

## 2022-12-19 DIAGNOSIS — Z87440 Personal history of urinary (tract) infections: Secondary | ICD-10-CM

## 2022-12-19 DIAGNOSIS — S2243XA Multiple fractures of ribs, bilateral, initial encounter for closed fracture: Secondary | ICD-10-CM | POA: Diagnosis not present

## 2022-12-19 DIAGNOSIS — E8809 Other disorders of plasma-protein metabolism, not elsewhere classified: Secondary | ICD-10-CM | POA: Diagnosis present

## 2022-12-19 DIAGNOSIS — R000 Tachycardia, unspecified: Secondary | ICD-10-CM | POA: Diagnosis not present

## 2022-12-19 DIAGNOSIS — J189 Pneumonia, unspecified organism: Secondary | ICD-10-CM | POA: Diagnosis not present

## 2022-12-19 DIAGNOSIS — E872 Acidosis, unspecified: Secondary | ICD-10-CM | POA: Diagnosis present

## 2022-12-19 DIAGNOSIS — R4182 Altered mental status, unspecified: Secondary | ICD-10-CM | POA: Diagnosis not present

## 2022-12-19 DIAGNOSIS — E860 Dehydration: Secondary | ICD-10-CM | POA: Diagnosis present

## 2022-12-19 DIAGNOSIS — N400 Enlarged prostate without lower urinary tract symptoms: Secondary | ICD-10-CM | POA: Diagnosis not present

## 2022-12-19 DIAGNOSIS — I110 Hypertensive heart disease with heart failure: Secondary | ICD-10-CM | POA: Diagnosis present

## 2022-12-19 DIAGNOSIS — Z833 Family history of diabetes mellitus: Secondary | ICD-10-CM

## 2022-12-19 DIAGNOSIS — G8929 Other chronic pain: Secondary | ICD-10-CM | POA: Diagnosis present

## 2022-12-19 DIAGNOSIS — Z8249 Family history of ischemic heart disease and other diseases of the circulatory system: Secondary | ICD-10-CM

## 2022-12-19 DIAGNOSIS — R627 Adult failure to thrive: Secondary | ICD-10-CM | POA: Diagnosis not present

## 2022-12-19 DIAGNOSIS — C9000 Multiple myeloma not having achieved remission: Secondary | ICD-10-CM | POA: Diagnosis not present

## 2022-12-19 DIAGNOSIS — D649 Anemia, unspecified: Secondary | ICD-10-CM | POA: Diagnosis not present

## 2022-12-19 DIAGNOSIS — K219 Gastro-esophageal reflux disease without esophagitis: Secondary | ICD-10-CM | POA: Diagnosis not present

## 2022-12-19 DIAGNOSIS — Z7901 Long term (current) use of anticoagulants: Secondary | ICD-10-CM

## 2022-12-19 DIAGNOSIS — G9341 Metabolic encephalopathy: Secondary | ICD-10-CM | POA: Diagnosis present

## 2022-12-19 DIAGNOSIS — N390 Urinary tract infection, site not specified: Principal | ICD-10-CM

## 2022-12-19 DIAGNOSIS — Z888 Allergy status to other drugs, medicaments and biological substances status: Secondary | ICD-10-CM

## 2022-12-19 DIAGNOSIS — F419 Anxiety disorder, unspecified: Secondary | ICD-10-CM | POA: Diagnosis not present

## 2022-12-19 DIAGNOSIS — N401 Enlarged prostate with lower urinary tract symptoms: Secondary | ICD-10-CM | POA: Diagnosis present

## 2022-12-19 DIAGNOSIS — L97329 Non-pressure chronic ulcer of left ankle with unspecified severity: Secondary | ICD-10-CM | POA: Diagnosis present

## 2022-12-19 DIAGNOSIS — A419 Sepsis, unspecified organism: Principal | ICD-10-CM

## 2022-12-19 DIAGNOSIS — Z923 Personal history of irradiation: Secondary | ICD-10-CM | POA: Diagnosis not present

## 2022-12-19 DIAGNOSIS — Z66 Do not resuscitate: Secondary | ICD-10-CM | POA: Diagnosis not present

## 2022-12-19 DIAGNOSIS — R531 Weakness: Secondary | ICD-10-CM | POA: Diagnosis not present

## 2022-12-19 DIAGNOSIS — R918 Other nonspecific abnormal finding of lung field: Secondary | ICD-10-CM | POA: Diagnosis not present

## 2022-12-19 DIAGNOSIS — R400 Somnolence: Secondary | ICD-10-CM

## 2022-12-19 DIAGNOSIS — R319 Hematuria, unspecified: Secondary | ICD-10-CM | POA: Diagnosis not present

## 2022-12-19 DIAGNOSIS — G629 Polyneuropathy, unspecified: Secondary | ICD-10-CM | POA: Diagnosis not present

## 2022-12-19 DIAGNOSIS — R41841 Cognitive communication deficit: Secondary | ICD-10-CM | POA: Diagnosis not present

## 2022-12-19 DIAGNOSIS — Z1152 Encounter for screening for COVID-19: Secondary | ICD-10-CM

## 2022-12-19 DIAGNOSIS — B952 Enterococcus as the cause of diseases classified elsewhere: Secondary | ICD-10-CM | POA: Diagnosis present

## 2022-12-19 DIAGNOSIS — Z515 Encounter for palliative care: Secondary | ICD-10-CM | POA: Diagnosis not present

## 2022-12-19 DIAGNOSIS — S42002A Fracture of unspecified part of left clavicle, initial encounter for closed fracture: Secondary | ICD-10-CM | POA: Diagnosis not present

## 2022-12-19 DIAGNOSIS — I482 Chronic atrial fibrillation, unspecified: Secondary | ICD-10-CM | POA: Diagnosis present

## 2022-12-19 DIAGNOSIS — Z885 Allergy status to narcotic agent status: Secondary | ICD-10-CM

## 2022-12-19 DIAGNOSIS — I10 Essential (primary) hypertension: Secondary | ICD-10-CM | POA: Diagnosis not present

## 2022-12-19 DIAGNOSIS — M6281 Muscle weakness (generalized): Secondary | ICD-10-CM | POA: Diagnosis not present

## 2022-12-19 DIAGNOSIS — N138 Other obstructive and reflux uropathy: Secondary | ICD-10-CM | POA: Diagnosis present

## 2022-12-19 DIAGNOSIS — Z79891 Long term (current) use of opiate analgesic: Secondary | ICD-10-CM

## 2022-12-19 DIAGNOSIS — Z823 Family history of stroke: Secondary | ICD-10-CM | POA: Diagnosis not present

## 2022-12-19 DIAGNOSIS — J984 Other disorders of lung: Secondary | ICD-10-CM | POA: Diagnosis not present

## 2022-12-19 DIAGNOSIS — E440 Moderate protein-calorie malnutrition: Secondary | ICD-10-CM | POA: Diagnosis not present

## 2022-12-19 DIAGNOSIS — Z79899 Other long term (current) drug therapy: Secondary | ICD-10-CM

## 2022-12-19 DIAGNOSIS — R54 Age-related physical debility: Secondary | ICD-10-CM | POA: Diagnosis present

## 2022-12-19 DIAGNOSIS — I5033 Acute on chronic diastolic (congestive) heart failure: Secondary | ICD-10-CM | POA: Diagnosis present

## 2022-12-19 DIAGNOSIS — R338 Other retention of urine: Secondary | ICD-10-CM | POA: Diagnosis not present

## 2022-12-19 DIAGNOSIS — I509 Heart failure, unspecified: Secondary | ICD-10-CM | POA: Diagnosis not present

## 2022-12-19 DIAGNOSIS — Z86711 Personal history of pulmonary embolism: Secondary | ICD-10-CM | POA: Diagnosis not present

## 2022-12-19 LAB — RESP PANEL BY RT-PCR (RSV, FLU A&B, COVID)  RVPGX2
Influenza A by PCR: NEGATIVE
Influenza B by PCR: NEGATIVE
Resp Syncytial Virus by PCR: NEGATIVE
SARS Coronavirus 2 by RT PCR: NEGATIVE

## 2022-12-19 LAB — LACTIC ACID, PLASMA
Lactic Acid, Venous: 2.8 mmol/L (ref 0.5–1.9)
Lactic Acid, Venous: 1.5 mmol/L (ref 0.5–1.9)

## 2022-12-19 LAB — COMPREHENSIVE METABOLIC PANEL
ALT: 6 U/L (ref 0–44)
AST: 20 U/L (ref 15–41)
Albumin: 2.1 g/dL — ABNORMAL LOW (ref 3.5–5.0)
Alkaline Phosphatase: 23 U/L — ABNORMAL LOW (ref 38–126)
Anion gap: 9 (ref 5–15)
BUN: 17 mg/dL (ref 8–23)
CO2: 26 mmol/L (ref 22–32)
Calcium: 8.7 mg/dL — ABNORMAL LOW (ref 8.9–10.3)
Chloride: 103 mmol/L (ref 98–111)
Creatinine, Ser: 0.83 mg/dL (ref 0.61–1.24)
GFR, Estimated: >60 mL/min (ref >60)
Glucose, Bld: 97 mg/dL (ref 70–99)
Potassium: 3.5 mmol/L (ref 3.5–5.1)
Sodium: 138 mmol/L (ref 135–145)
Total Bilirubin: 0.7 mg/dL (ref <1.2)
Total Protein: 8.3 g/dL — ABNORMAL HIGH (ref 6.5–8.1)

## 2022-12-19 LAB — CBC WITH DIFFERENTIAL/PLATELET
Abs Immature Granulocytes: 0.04 10*3/uL (ref 0.00–0.07)
Basophils Absolute: 0 10*3/uL (ref 0.0–0.1)
Basophils Relative: 0 %
Eosinophils Absolute: 0 10*3/uL (ref 0.0–0.5)
Eosinophils Relative: 0 %
HCT: 35 % — ABNORMAL LOW (ref 39.0–52.0)
Hemoglobin: 10.6 g/dL — ABNORMAL LOW (ref 13.0–17.0)
Immature Granulocytes: 1 %
Lymphocytes Relative: 31 %
Lymphs Abs: 2.1 10*3/uL (ref 0.7–4.0)
MCH: 29.3 pg (ref 26.0–34.0)
MCHC: 30.3 g/dL (ref 30.0–36.0)
MCV: 96.7 fL (ref 80.0–100.0)
Monocytes Absolute: 0.7 10*3/uL (ref 0.1–1.0)
Monocytes Relative: 10 %
Neutro Abs: 3.9 10*3/uL (ref 1.7–7.7)
Neutrophils Relative %: 58 %
Platelets: 197 10*3/uL (ref 150–400)
RBC: 3.62 MIL/uL — ABNORMAL LOW (ref 4.22–5.81)
RDW: 16 % — ABNORMAL HIGH (ref 11.5–15.5)
WBC: 6.8 10*3/uL (ref 4.0–10.5)
nRBC: 0 % (ref 0.0–0.2)

## 2022-12-19 LAB — PROTIME-INR
INR: 2.2 — ABNORMAL HIGH (ref 0.8–1.2)
Prothrombin Time: 24.7 s — ABNORMAL HIGH (ref 11.4–15.2)

## 2022-12-19 LAB — TROPONIN I (HIGH SENSITIVITY)
Troponin I (High Sensitivity): 10 ng/L (ref <18)
Troponin I (High Sensitivity): 11 ng/L (ref <18)

## 2022-12-19 LAB — APTT: aPTT: 35 s (ref 24–36)

## 2022-12-19 MED ORDER — METOPROLOL TARTRATE 5 MG/5ML IV SOLN
2.5000 mg | INTRAVENOUS | Status: DC | PRN
  Administered 2022-12-19: 2.5 mg via INTRAVENOUS
  Filled 2022-12-19 (×2): qty 5

## 2022-12-19 MED ORDER — ONDANSETRON HCL 4 MG PO TABS: 4.0000 mg | ORAL_TABLET | Freq: Four times a day (QID) | ORAL | Status: DC | PRN

## 2022-12-19 MED ORDER — TRAZODONE HCL 100 MG PO TABS
100.0000 mg | ORAL_TABLET | Freq: Every day | ORAL | Status: DC
  Administered 2022-12-19 – 2022-12-23 (×5): 100 mg via ORAL
  Filled 2022-12-19 (×5): qty 1

## 2022-12-19 MED ORDER — ACETAMINOPHEN 500 MG PO TABS
500.0000 mg | ORAL_TABLET | Freq: Four times a day (QID) | ORAL | Status: DC | PRN
  Administered 2022-12-20: 500 mg via ORAL
  Administered 2022-12-21 – 2022-12-23 (×2): 1000 mg via ORAL
  Filled 2022-12-19 (×3): qty 2

## 2022-12-19 MED ORDER — SODIUM CHLORIDE 0.9 % IV SOLN: INTRAVENOUS | Status: DC

## 2022-12-19 MED ORDER — TAMSULOSIN HCL 0.4 MG PO CAPS
0.4000 mg | ORAL_CAPSULE | Freq: Every day | ORAL | Status: DC
  Administered 2022-12-19 – 2022-12-23 (×5): 0.4 mg via ORAL
  Filled 2022-12-19 (×5): qty 1

## 2022-12-19 MED ORDER — FERROUS SULFATE 325 (65 FE) MG PO TABS
325.0000 mg | ORAL_TABLET | Freq: Every day | ORAL | Status: DC
  Administered 2022-12-19 – 2022-12-24 (×6): 325 mg via ORAL
  Filled 2022-12-19 (×6): qty 1

## 2022-12-19 MED ORDER — SODIUM CHLORIDE 0.9 % IV SOLN
2.0000 g | Freq: Once | INTRAVENOUS | Status: AC
  Administered 2022-12-19: 2 g via INTRAVENOUS
  Filled 2022-12-19: qty 12.5

## 2022-12-19 MED ORDER — ENSURE ENLIVE PO LIQD
237.0000 mL | Freq: Three times a day (TID) | ORAL | Status: DC
  Administered 2022-12-19 – 2022-12-24 (×13): 237 mL via ORAL

## 2022-12-19 MED ORDER — GABAPENTIN 300 MG PO CAPS
300.0000 mg | ORAL_CAPSULE | Freq: Two times a day (BID) | ORAL | Status: DC
  Administered 2022-12-19 – 2022-12-24 (×10): 300 mg via ORAL
  Filled 2022-12-19 (×10): qty 1

## 2022-12-19 MED ORDER — APIXABAN 5 MG PO TABS
5.0000 mg | ORAL_TABLET | Freq: Two times a day (BID) | ORAL | Status: DC
  Administered 2022-12-19 – 2022-12-24 (×10): 5 mg via ORAL
  Filled 2022-12-19 (×10): qty 1

## 2022-12-19 MED ORDER — FUROSEMIDE 10 MG/ML IJ SOLN: 20.0000 mg | Freq: Two times a day (BID) | INTRAMUSCULAR | Status: DC

## 2022-12-19 MED ORDER — ALBUMIN HUMAN 25 % IV SOLN
25.0000 g | Freq: Four times a day (QID) | INTRAVENOUS | Status: AC
Start: 2022-12-19 — End: 2022-12-20
  Administered 2022-12-19 – 2022-12-20 (×3): 25 g via INTRAVENOUS
  Filled 2022-12-19 (×3): qty 100

## 2022-12-19 MED ORDER — MONTELUKAST SODIUM 10 MG PO TABS
10.0000 mg | ORAL_TABLET | Freq: Every day | ORAL | Status: DC
  Administered 2022-12-19 – 2022-12-23 (×5): 10 mg via ORAL
  Filled 2022-12-19 (×5): qty 1

## 2022-12-19 MED ORDER — METRONIDAZOLE 500 MG/100ML IV SOLN
500.0000 mg | Freq: Once | INTRAVENOUS | Status: AC
  Administered 2022-12-19: 500 mg via INTRAVENOUS
  Filled 2022-12-19: qty 100

## 2022-12-19 MED ORDER — VANCOMYCIN HCL IN DEXTROSE 1-5 GM/200ML-% IV SOLN
1000.0000 mg | Freq: Once | INTRAVENOUS | Status: AC
  Administered 2022-12-19: 1000 mg via INTRAVENOUS
  Filled 2022-12-19: qty 200

## 2022-12-19 MED ORDER — METOPROLOL TARTRATE 25 MG PO TABS
12.5000 mg | ORAL_TABLET | Freq: Two times a day (BID) | ORAL | Status: DC
  Administered 2022-12-19 – 2022-12-20 (×2): 12.5 mg via ORAL
  Filled 2022-12-19 (×2): qty 1

## 2022-12-19 MED ORDER — DIGOXIN 0.25 MG/ML IJ SOLN
0.1250 mg | Freq: Once | INTRAMUSCULAR | Status: AC
  Administered 2022-12-19: 0.125 mg via INTRAVENOUS
  Filled 2022-12-19 (×2): qty 2

## 2022-12-19 MED ORDER — SODIUM CHLORIDE 0.9 % IV SOLN
2.0000 g | Freq: Three times a day (TID) | INTRAVENOUS | Status: DC
  Administered 2022-12-19 – 2022-12-23 (×11): 2 g via INTRAVENOUS
  Filled 2022-12-19 (×14): qty 12.5

## 2022-12-19 MED ORDER — MELATONIN 5 MG PO TABS
10.0000 mg | ORAL_TABLET | Freq: Every day | ORAL | Status: DC
  Administered 2022-12-19 – 2022-12-23 (×5): 10 mg via ORAL
  Filled 2022-12-19 (×5): qty 2

## 2022-12-19 MED ORDER — ALPRAZOLAM 0.5 MG PO TABS
0.2500 mg | ORAL_TABLET | Freq: Every day | ORAL | Status: DC
  Administered 2022-12-19 – 2022-12-23 (×5): 0.25 mg via ORAL
  Filled 2022-12-19 (×5): qty 1

## 2022-12-19 MED ORDER — ONDANSETRON HCL 4 MG/2ML IJ SOLN: 4.0000 mg | Freq: Four times a day (QID) | INTRAMUSCULAR | Status: DC | PRN

## 2022-12-19 MED ORDER — GUAIFENESIN-DM 100-10 MG/5ML PO SYRP: 5.0000 mL | ORAL_SOLUTION | ORAL | Status: DC | PRN

## 2022-12-19 MED ORDER — OXYCODONE HCL 5 MG PO TABS
10.0000 mg | ORAL_TABLET | Freq: Four times a day (QID) | ORAL | Status: DC | PRN
  Administered 2022-12-20 – 2022-12-24 (×3): 10 mg via ORAL
  Filled 2022-12-19 (×5): qty 2

## 2022-12-19 NOTE — Sepsis Progress Note (Signed)
Confirmed with bedside RN Cicero Duck that second lactic acid was drawn and sent at 1400, but a used a temporary label printed with the first draw.

## 2022-12-19 NOTE — ED Notes (Signed)
Dime sized wound on L lateral ankle. Mepilex applied

## 2022-12-19 NOTE — ED Provider Notes (Signed)
[facility] Provider Note    Event Date/Time   First MD Initiated Contact with Patient 12/19/22 1127     (approximate)   History   Urinary Tract Infection   HPI  Dean [name]. is a 87 y.o. male with a history of multiple myeloma, diverticulitis, decubitus ulcer who presents with reports of elevated heart rate and possible urinary tract infection.  Reportedly the patient has been treated for urinary tract infection in his facility but it does not seem to have improved since the emergency department.     Physical Exam   Triage Vital Signs: ED Triage Vitals  Encounter Vitals Group     BP 12/19/22 1134 97/73     Systolic BP Percentile --      Diastolic BP Percentile --      Pulse Rate 12/19/22 1129 (!) 120     Resp 12/19/22 1134 18     Temp 12/19/22 1129 100 F (37.8 C)     Temp Source 12/19/22 1129 Oral     SpO2 12/19/22 1129 97 %     Weight 12/19/22 1137 86.6 kg (191 lb)     Height --      Head Circumference --      Peak Flow --      Pain Score --      Pain Loc --      Pain Education --      Exclude from Growth Chart --     Most recent vital signs: Vitals:   12/19/22 1129 12/19/22 1134  BP:  97/73  Pulse: (!) 120   Resp:  18  Temp: 100 F (37.8 C)   SpO2: 97%      General: Awake, no distress.  CV:  Good peripheral perfusion.  Resp:  Normal effort.  Abd:  No distention.  Soft, nontender Other:  Stage II decubitus ulcer noted, no signs of infection.  Mild lateral malleolus ulcerations bilaterally, no signs of infection.   ED Results / Procedures / Treatments   Labs (all labs ordered are listed, but only abnormal results are displayed) Labs Reviewed  LACTIC ACID, PLASMA - Abnormal; Notable for the following components:      Result Value   Lactic Acid, Venous 2.8 (*)    All other components within normal limits  COMPREHENSIVE METABOLIC PANEL - Abnormal; Notable for the following components:   Calcium 8.7 (*)    Total  Protein 8.3 (*)    Albumin 2.1 (*)    Alkaline Phosphatase 23 (*)    All other components within normal limits  CBC WITH DIFFERENTIAL/PLATELET - Abnormal; Notable for the following components:   RBC 3.62 (*)    Hemoglobin 10.6 (*)    HCT 35.0 (*)    RDW 16.0 (*)    All other components within normal limits  PROTIME-INR - Abnormal; Notable for the following components:   Prothrombin Time 24.7 (*)    INR 2.2 (*)    All other components within normal limits  RESP PANEL BY RT-PCR (RSV, FLU A&B, COVID)  RVPGX2  CULTURE, BLOOD (ROUTINE X 2)  CULTURE, BLOOD (ROUTINE X 2)  APTT  LACTIC ACID, PLASMA  URINALYSIS, W/ REFLEX TO CULTURE (INFECTION SUSPECTED)  TROPONIN I (HIGH SENSITIVITY)  TROPONIN I (HIGH SENSITIVITY)     EKG  ED ECG REPORT I, Jene Every, the attending physician, personally viewed and interpreted this ECG.   Rhythm: sinus tachycardia QRS Axis: normal Intervals: normal ST/T Wave abnormalities: normal  Narrative Interpretation: no evidence of acute ischemia    RADIOLOGY Chest x-ray viewed interpret by me, question possible pneumonia    PROCEDURES:  Critical Care performed: yes  CRITICAL CARE Performed by: Jene Every   Total critical care time: 30 minutes  Critical care time was exclusive of separately billable procedures and treating other patients.  Critical care was necessary to treat or prevent imminent or life-threatening deterioration.  Critical care was time spent personally by me on the following activities: development of treatment plan with patient and/or surrogate as well as nursing, discussions with consultants, evaluation of patient's response to treatment, examination of patient, obtaining history from patient or surrogate, ordering and performing treatments and interventions, ordering and review of laboratory studies, ordering and review of radiographic studies, pulse oximetry and re-evaluation of patient's  condition.   Procedures   MEDICATIONS ORDERED IN ED: Medications  vancomycin (VANCOCIN) IVPB 1000 mg/200 mL premix (has no administration in time range)  ceFEPIme (MAXIPIME) 2 g in sodium chloride 0.9 % 100 mL IVPB (0 g Intravenous Stopped 12/19/22 1349)  metroNIDAZOLE (FLAGYL) IVPB 500 mg (0 mg Intravenous Stopped 12/19/22 1349)     IMPRESSION / MDM / ASSESSMENT AND PLAN / ED COURSE  I reviewed the triage vital signs and the nursing notes. Patient's presentation is most consistent with acute presentation with potential threat to life or bodily function.  Patient presents with reports of worsening urinary tract infection, he is febrile and tachycardic although not hypotensive.  Strong concern for infection/sepsis, code sepsis activated, broad-spectrum antibiotics ordered  Lactic acid is elevated at 2.8, white blood cell count is normal.  Pending urinalysis, chest x-ray concerning for possible pneumonia although unclear given multiple myeloma history.  Have discussed with the hospitalist for admission      FINAL CLINICAL IMPRESSION(S) / ED DIAGNOSES   Final diagnoses:  Sepsis, due to unspecified organism, unspecified whether acute organ dysfunction present [facility])     Rx / DC Orders   ED Discharge Orders     None        Note:  This document was prepared using Dragon voice recognition software and may include unintentional dictation errors.   Jene Every, MD 12/19/22 [phone]

## 2022-12-19 NOTE — ED Triage Notes (Signed)
Pt arrives via [facility] from [facility]. UTI last week that is not getting better. Speech not at baseline per EMS.   EMS vitals 98.7 F 130 HR

## 2022-12-19 NOTE — Sepsis Progress Note (Signed)
eLink is following this Code Sepsis. °

## 2022-12-19 NOTE — H&P (Addendum)
History and Physical    Dean [name]. ZOX:[id number] DOB: 01-03-1936 DOA: 12/19/2022  PCP: Donita Brooks, MD (Confirm with patient/family/NH records and if not entered, this has to be entered at [facility] point of entry) Patient coming from: Home  I have personally briefly reviewed patient's old medical records in [facility]  Chief Complaint: Patient sent from rehab center for AMS  HPI: Dean Vail. is a 87 y.o. male with medical history significant of multiple myeloma end-stage, HTN, BPH, PAF on Eliquis, PE on Eliquis, who was just recently discharged from hospital to nursing home, sent back from nursing home today for evaluation of worsening of confusion and possible UTI failed outpatient management.  Patient is confused and unable to provide any history, daughter at bedside provided all history.  Patient was hospitalized 3 weeks ago for treatment of hypoxia and aspiration pneumonia.  Discharged to nursing home and patient continued on p.o. antibiotics for the pneumonia for another 7 days.  Initially patient recovered well but about 1 week ago patient started develop lethargy, generalized weakness and strong smell of urine and rehab center started him on IV antibiotic and today will be the last day.  Despite, patient continues to have daytime sleepiness, generalized weakness and significant decreased oral intake for 7 days.  No fever as per daughter.  At the time I saw patient, patient complained about chronic shoulder and back pain which family attributed to his multiple myeloma.  Denied any urinary symptoms no cough, denied any fever chills or chest pains. ED Course: Temperature 100.0, pulses 120, EKG showed uncontrolled A-fib, blood pressure 97/73, O2 saturation 97% on room air.  WBC 6.8, lactate 2.8, creatinine 0.8.  Patient was given cefepime and Flagyl in the ED  Review of Systems: Unable to perform, patient is confused.  Past Medical History:  Diagnosis Date   BPH  (benign prostatic hyperplasia)    Colonic diverticular abscess 01/08/2015   Diverticulitis 12/22/2014   complicated by abscess and required percutaneous drainage   GERD (gastroesophageal reflux disease)    H/O ETOH abuse    History of chemotherapy last done jan 2017   History of radiation therapy 01/05/13-02/10/13   45 gray to left occipital condyle region   History of radiation therapy 12/01/16-12/10/16   Parasternal nodule, chest- 24 Gy total delivered in 8 fractions, Left sacro-iliac, pelvis- 24 Gy total delivered in 8 fractions    History of radiation therapy 02/19/17-02/22/17   right temporal scalp 30 Gy in 10 fractions   History of radiation therapy    Right hip( Pelvis) 11/18/21-11/21/21-Dr. Antony Blackbird   Intra-abdominal abscess (HCC)    Multiple myeloma (HCC) 2015   Neuropathy    Radiation 02/21/14-03/08/14   right posterior chest wall area 30 gray   Radiation 04/19/14-05/02/14   lumbar spine 25 gray   Skull lesion    Left occipital condyle   Wrist fracture, left    x 2    Past Surgical History:  Procedure Laterality Date   COLONOSCOPY N/A 04/19/2015   Procedure: COLONOSCOPY;  Surgeon: West Bali, MD;  Location: [facility];  Service: Endoscopy;  Laterality: N/A;  1:30 PM   CYST EXCISION  1959   tail bone   CYSTOSCOPY WITH FULGERATION N/A 08/20/2022   Procedure: CYSTOSCOPY WITH FULGERATION;  Surgeon: Crista Elliot, MD;  Location: [facility];  Service: Urology;  Laterality: N/A;  cysto clot evac   IR IMAGING GUIDED PORT INSERTION  09/25/2017   IR  REMOVAL TUN ACCESS W/ PORT W/O FL MOD SED  10/21/2022     reports that he has never smoked. He has never used smokeless tobacco. He reports that he does not drink alcohol and does not use drugs.  Allergies  Allergen Reactions   Codeine Other (See Comments)    Headache   Morphine And Codeine Other (See Comments)    Makes him feel weird   Revlimid [Lenalidomide] Other (See Comments)    "Causes me to become weak"    Family  History  Problem Relation Age of Onset   Diabetes Mother    Stroke Mother    Hypertension Father     Prior to Admission medications   Medication Sig Start Date End Date Taking? Authorizing Provider  acetaminophen (TYLENOL) 500 MG tablet Take 500-1,000 mg by mouth every 6 (six) hours as needed for mild pain or moderate pain.    [provider]  acyclovir (ZOVIRAX) 400 MG tablet Take 400 mg by mouth 2 (two) times daily.    [provider]  ALPRAZolam Prudy Feeler) 1 MG tablet Take 1 tablet (1 mg total) by mouth at bedtime. 11/27/22   Alford Highland, MD  Calcium Carbonate (CALCIUM 500 PO) Take 750 mg by mouth daily.    [provider]  cyanocobalamin (VITAMIN B12) 500 MCG tablet Take 1 tablet (500 mcg total) by mouth daily. 02/13/22   Catarina Hartshorn, MD  ELIQUIS 5 MG TABS tablet TAKE ONE TABLET BY MOUTH TWICE DAILY 10/28/22   Jeralyn Ruths, MD  feeding supplement (ENSURE ENLIVE / ENSURE PLUS) LIQD Take 237 mLs by mouth 3 (three) times daily between meals. 11/27/22   Alford Highland, MD  ferrous sulfate 325 (65 FE) MG tablet Take 325 mg by mouth daily.    [provider]  folic acid (FOLVITE) 1 MG tablet TAKE ONE TABLET (1 MG TOTAL) BY MOUTH DAILY. 10/28/22   Jeralyn Ruths, MD  furosemide (LASIX) 20 MG tablet Take 1 tablet (20 mg total) by mouth daily. 11/27/22   Alford Highland, MD  gabapentin (NEURONTIN) 400 MG capsule Take 1 capsule (400 mg total) by mouth 2 (two) times daily. 11/27/22   Alford Highland, MD  guaiFENesin-dextromethorphan (ROBITUSSIN DM) 100-10 MG/5ML syrup Take 5 mLs by mouth every 4 (four) hours as needed for cough (chest congestion). 11/27/22   Alford Highland, MD  Melatonin 10 MG TABS Take 10 mg by mouth at bedtime.    [provider]  metoprolol tartrate (LOPRESSOR) 25 MG tablet Take 0.5 tablets (12.5 mg total) by mouth 2 (two) times daily. 11/13/22 12/13/22  Donita Brooks, MD  montelukast (SINGULAIR) 10 MG tablet Take 1  tablet (10 mg total) by mouth at bedtime. 09/30/22   Donita Brooks, MD  oxyCODONE (OXYCONTIN) 10 mg 12 hr tablet Take 1 tablet (10 mg total) by mouth every 12 (twelve) hours. 12/11/22   Borders, Daryl Eastern, NP  Oxycodone HCl 10 MG TABS Take 1 tablet (10 mg total) by mouth every 6 (six) hours as needed. 12/11/22   Borders, Daryl Eastern, NP  potassium chloride SA (KLOR-CON M) 10 MEQ tablet Take 1 tablet (10 mEq total) by mouth daily. 11/28/22   Alford Highland, MD  tamsulosin (FLOMAX) 0.4 MG CAPS capsule TAKE ONE CAPSULE BY MOUTH EVERY NIGHT ATBEDTIME 08/29/22   Donita Brooks, MD  traZODone (DESYREL) 300 MG tablet Take 1 tablet (300 mg total) by mouth at bedtime. 11/27/22   Alford Highland, MD    Physical Exam: Vitals:  12/19/22 1129 12/19/22 1134 12/19/22 1137  BP:  97/73   Pulse: (!) 120    Resp:  18   Temp: 100 F (37.8 C)    TempSrc: Oral    SpO2: 97%    Weight:   86.6 kg    Constitutional: NAD, calm, comfortable Vitals:   12/19/22 1129 12/19/22 1134 12/19/22 1137  BP:  97/73   Pulse: (!) 120    Resp:  18   Temp: 100 F (37.8 C)    TempSrc: Oral    SpO2: 97%    Weight:   86.6 kg   Eyes: PERRL, lids and conjunctivae normal ENMT: Mucous membranes are dry. Posterior pharynx clear of any exudate or lesions.Normal dentition.  Neck: normal, supple, no masses, no thyromegaly Respiratory: clear to auscultation bilaterally, no wheezing, no crackles. Normal respiratory effort. No accessory muscle use.  Cardiovascular: Irregular heart rate, no murmurs / rubs / gallops. No extremity edema. 2+ pedal pulses. No carotid bruits.  Abdomen: no tenderness, no masses palpated. No hepatosplenomegaly. Bowel sounds positive.  Musculoskeletal: no clubbing / cyanosis. No joint deformity upper and lower extremities. Good ROM, no contractures. Normal muscle tone.  Skin: Stage II ulcers on sacral area, shallow ulcer on left lateral ankle Neurologic: CN 2-12 grossly intact. Sensation intact, DTR  normal. Strength 5/5 in all 4.  Psychiatric: Lethargic, oriented to person and place, confused about time    Labs on Admission: I have personally reviewed following labs and imaging studies  CBC: Recent Labs  Lab 12/19/22 1150  WBC 6.8  NEUTROABS 3.9  HGB 10.6*  HCT 35.0*  MCV 96.7  PLT 197   Basic Metabolic Panel: Recent Labs  Lab 12/19/22 1150  NA 138  K 3.5  CL 103  CO2 26  GLUCOSE 97  BUN 17  CREATININE 0.83  CALCIUM 8.7*   GFR: Estimated Creatinine Clearance: 69.7 mL/min (by C-G formula based on SCr of 0.83 mg/dL). Liver Function Tests: Recent Labs  Lab 12/19/22 1150  AST 20  ALT 6  ALKPHOS 23*  BILITOT 0.7  PROT 8.3*  ALBUMIN 2.1*   No results for input(s): "LIPASE", "AMYLASE" in the last 168 hours. No results for input(s): "AMMONIA" in the last 168 hours. Coagulation Profile: Recent Labs  Lab 12/19/22 1150  INR 2.2*   Cardiac Enzymes: No results for input(s): "CKTOTAL", "CKMB", "CKMBINDEX", "TROPONINI" in the last 168 hours. BNP (last 3 results) No results for input(s): "PROBNP" in the last 8760 hours. HbA1C: No results for input(s): "HGBA1C" in the last 72 hours. CBG: No results for input(s): "GLUCAP" in the last 168 hours. Lipid Profile: No results for input(s): "CHOL", "HDL", "LDLCALC", "TRIG", "CHOLHDL", "LDLDIRECT" in the last 72 hours. Thyroid Function Tests: No results for input(s): "TSH", "T4TOTAL", "FREET4", "T3FREE", "THYROIDAB" in the last 72 hours. Anemia Panel: No results for input(s): "VITAMINB12", "FOLATE", "FERRITIN", "TIBC", "IRON", "RETICCTPCT" in the last 72 hours. Urine analysis:    Component Value Date/Time   COLORURINE YELLOW (A) 11/23/2022 1208   APPEARANCEUR HAZY (A) 11/23/2022 1208   LABSPEC 1.010 11/23/2022 1208   PHURINE 7.0 11/23/2022 1208   GLUCOSEU NEGATIVE 11/23/2022 1208   HGBUR SMALL (A) 11/23/2022 1208   BILIRUBINUR NEGATIVE 11/23/2022 1208   BILIRUBINUR negative 07/18/2021 0841   KETONESUR NEGATIVE  11/23/2022 1208   PROTEINUR NEGATIVE 11/23/2022 1208   UROBILINOGEN 0.2 07/18/2021 0841   UROBILINOGEN 0.2 01/02/2014 1103   NITRITE NEGATIVE 11/23/2022 1208   LEUKOCYTESUR LARGE (A) 11/23/2022 1208    Radiological Exams  on Admission: No results found.  EKG: Independently reviewed.  A-fib with RVR, reported as sinus tachycardia, no acute ST changes.  Assessment/Plan Principal Problem:   AMS (altered mental status) Active Problems:   Acute on chronic diastolic CHF (congestive heart failure) (HCC)   UTI (urinary tract infection)  (please populate well all problems here in Problem List. (For example, if patient is on BP meds at home and you resume or decide to hold them, it is a problem that needs to be her. Same for CAD, COPD, HLD and so on)  UTI, failed outpatient management -With worsening of encephalopathy and again strong smell of urine, implying recurrent UTI, failed IV antibiotics and nursing home.  Decided to escalate antibiotics to cefepime while waiting for urine culture.  Discussed with nursing home staff, who renewed back both UA and urine culture result from 12/11/2022 which showed Morganella MDR, sensitive to cefepime, imipenem.  Nursing home staff also reported and the patient has been agitated and frequently throughout his IV lines and probably has not received cefepime for the last few days.  Acute metabolic encephalopathy -Secondary to UTI, management as above -Decide also to cut down somewhat with pain medication and sedation medications, hold off long-acting oxycodone, decrease Xanax dosage to 0.5 mg at bedtime.  Cut down gabapentin from 400 to 300 mg  A-fib with RVR -Volume depleted, will use albumin to hydrate as mentioned above -1 dose of digoxin -As needed Lopressor -Continue p.o. metoprolol and Eliquis  Failure to thrive -Severe dehydration secondary to poor oral intake -Consult dietitian -Family reported the patient has had no further aspiration episode  after resuming to regular diet and nursing home.  Severe dehydration -Encourage oral intake -Given patient also has concurrent severe hypoalbuminemia, will give 3 doses of albumin  Stage II pressure ulcer on sacrum and ankle left side -This wounds are chronic, has no signs of active infection.  Consult wound care team  BPH with chronic bladder outlet obstruction -Family requested no cath for urine as patient sustained severe ureteral damage on the recent Foley insertion in summer this year. -Continue Flomax  MM with chronic pain -Cut down his narcotics as mentioned above  DVT prophylaxis: Eliquis Code Status: DNR Family Communication: Bedside daughter Disposition Plan: Expect less than 2 midnight hospital stay Consults called: None Admission status: Telemetry observation   Emeline General MD [facility] Pager [phone]  12/19/2022, 2:07 PM

## 2022-12-19 NOTE — Consult Note (Signed)
PHARMACY - BRIEF ANTIBIOTIC NOTE   Pharmacy has received consult(s) for cefepime and vancomycin from an ED provider. The patient's profile has been reviewed for ht/wt/allergies/indication/available labs.    One time order(s) placed for: cefepime 2 g and vancomycin 1000 mg  Further antibiotics/pharmacy consults should be ordered by admitting physician if indicated.                       Thank you,  Will M. Dareen Piano, PharmD Clinical Pharmacist 12/19/2022 11:40 AM

## 2022-12-19 NOTE — Consult Note (Signed)
CODE SEPSIS - PHARMACY COMMUNICATION  **Broad-spectrum antimicrobials should be administered within one hour of sepsis diagnosis**  Time Code Sepsis call or page was received: 1133  Antibiotics ordered: Cefepime, Vancomycin, Metronidazole  Time of first antibiotic administration: 1148  Additional action taken by pharmacy: N/A  If necessary, name of provider/nurse contacted: N/A    Will M. Dareen Piano, PharmD Clinical Pharmacist 12/19/2022 12:10 PM

## 2022-12-19 NOTE — ED Notes (Addendum)
Stage 2 wound noted on buttock

## 2022-12-19 NOTE — Consult Note (Signed)
Pharmacy Antibiotic Note  ASSESSMENT: 87 y.o. male with PMH including late stage multiple myeloma, Afib and PE (on Eliquis) is presenting with sepsis and concerns for worsening UTI. Patient was hospitalized within the past month with hypoxia and aspiration pneumonia. Reportedly patient was treated with an IV antibiotic (unknown which one) recently at nursing home for UTI but has still worsened. Pharmacy has been consulted to manage cefepime dosing.  Patient measurements: Weight: 86.6 kg (191 lb)  Vital signs: Temp: 100 F (37.8 C) (11/15 1129) Temp Source: Oral (11/15 1129) BP: 97/73 (11/15 1134) Pulse Rate: 120 (11/15 1129) Recent Labs  Lab 12/19/22 1150  WBC 6.8  CREATININE 0.83   Estimated Creatinine Clearance: 69.7 mL/min (by C-G formula based on SCr of 0.83 mg/dL).  Allergies: Allergies  Allergen Reactions   Codeine Other (See Comments)    Headache   Morphine And Codeine Other (See Comments)    Makes him feel weird   Revlimid [Lenalidomide] Other (See Comments)    "Causes me to become weak"    Antimicrobials this admission: Cefepime 11/15 >>  Vancomycin 11/15 x 1 Metronidazole 11/15 x 1  Dose adjustments this admission: N/A  Microbiology results: 11/15 Bcx: sent  PLAN: Initiate cefepime 2 g IV q12H Follow up culture results to assess for antibiotic optimization. Monitor renal function to assess for any necessary antibiotic dosing changes.   Thank you for allowing pharmacy to be a part of this patient's care.  Will M. Dareen Piano, PharmD Clinical Pharmacist 12/19/2022 2:33 PM

## 2022-12-19 NOTE — Consult Note (Addendum)
WOC Nurse Consult Note: Reason for Consult: Consult requested for "multiple pressure injuries to leg and back." Secure chat message sent to the primary team as follows: "WOC team is not available at [facility] this afternoon, we are working at other facilities.  Please enter photos of the wounds into the EMR and we will provide topical treatment recommendations."   There are currently no progress notes or entries in the bedside nursing flow sheet regarding the stages or locations of the wounds, since Pt was just admitted to the ED.  Pressure Injury POA: Yes Air mattress ordered to reduce pressure when transferred from ED to the floor.  Topical treatment orders provided for bedside nurses to perform as follows until [facility] team can perform an assessment on Monday: Apply moist gauze dressings to open wounds Q day and cover with foam dressings.  Change foam dressings Q 3 days or PRN soiling.   Thank-you,  Cammie Mcgee MSN, RN, CWOCN, [place], CNS [phone]

## 2022-12-19 NOTE — ED Notes (Signed)
Patient cleaned and new pad and brief placed. New bed sheet. Patient pulled up in bed

## 2022-12-20 ENCOUNTER — Observation Stay: Payer: Medicare Other

## 2022-12-20 DIAGNOSIS — L97329 Non-pressure chronic ulcer of left ankle with unspecified severity: Secondary | ICD-10-CM | POA: Diagnosis present

## 2022-12-20 DIAGNOSIS — G629 Polyneuropathy, unspecified: Secondary | ICD-10-CM

## 2022-12-20 DIAGNOSIS — G4700 Insomnia, unspecified: Secondary | ICD-10-CM | POA: Diagnosis not present

## 2022-12-20 DIAGNOSIS — R400 Somnolence: Secondary | ICD-10-CM | POA: Diagnosis not present

## 2022-12-20 DIAGNOSIS — Z7901 Long term (current) use of anticoagulants: Secondary | ICD-10-CM | POA: Diagnosis not present

## 2022-12-20 DIAGNOSIS — Z66 Do not resuscitate: Secondary | ICD-10-CM | POA: Diagnosis not present

## 2022-12-20 DIAGNOSIS — A419 Sepsis, unspecified organism: Secondary | ICD-10-CM | POA: Diagnosis not present

## 2022-12-20 DIAGNOSIS — R319 Hematuria, unspecified: Secondary | ICD-10-CM | POA: Diagnosis not present

## 2022-12-20 DIAGNOSIS — N401 Enlarged prostate with lower urinary tract symptoms: Secondary | ICD-10-CM

## 2022-12-20 DIAGNOSIS — I482 Chronic atrial fibrillation, unspecified: Secondary | ICD-10-CM | POA: Diagnosis present

## 2022-12-20 DIAGNOSIS — I5033 Acute on chronic diastolic (congestive) heart failure: Secondary | ICD-10-CM | POA: Diagnosis present

## 2022-12-20 DIAGNOSIS — R627 Adult failure to thrive: Secondary | ICD-10-CM

## 2022-12-20 DIAGNOSIS — I509 Heart failure, unspecified: Secondary | ICD-10-CM | POA: Diagnosis not present

## 2022-12-20 DIAGNOSIS — E8809 Other disorders of plasma-protein metabolism, not elsewhere classified: Secondary | ICD-10-CM | POA: Diagnosis present

## 2022-12-20 DIAGNOSIS — B952 Enterococcus as the cause of diseases classified elsewhere: Secondary | ICD-10-CM | POA: Diagnosis present

## 2022-12-20 DIAGNOSIS — E440 Moderate protein-calorie malnutrition: Secondary | ICD-10-CM | POA: Diagnosis not present

## 2022-12-20 DIAGNOSIS — I110 Hypertensive heart disease with heart failure: Secondary | ICD-10-CM | POA: Diagnosis present

## 2022-12-20 DIAGNOSIS — C9000 Multiple myeloma not having achieved remission: Secondary | ICD-10-CM | POA: Diagnosis present

## 2022-12-20 DIAGNOSIS — N138 Other obstructive and reflux uropathy: Secondary | ICD-10-CM | POA: Diagnosis present

## 2022-12-20 DIAGNOSIS — F419 Anxiety disorder, unspecified: Secondary | ICD-10-CM | POA: Diagnosis not present

## 2022-12-20 DIAGNOSIS — E872 Acidosis, unspecified: Secondary | ICD-10-CM | POA: Diagnosis present

## 2022-12-20 DIAGNOSIS — N390 Urinary tract infection, site not specified: Secondary | ICD-10-CM | POA: Diagnosis present

## 2022-12-20 DIAGNOSIS — Z9221 Personal history of antineoplastic chemotherapy: Secondary | ICD-10-CM | POA: Diagnosis not present

## 2022-12-20 DIAGNOSIS — R338 Other retention of urine: Secondary | ICD-10-CM | POA: Diagnosis not present

## 2022-12-20 DIAGNOSIS — Z1152 Encounter for screening for COVID-19: Secondary | ICD-10-CM | POA: Diagnosis not present

## 2022-12-20 DIAGNOSIS — R41841 Cognitive communication deficit: Secondary | ICD-10-CM | POA: Diagnosis not present

## 2022-12-20 DIAGNOSIS — K219 Gastro-esophageal reflux disease without esophagitis: Secondary | ICD-10-CM | POA: Diagnosis not present

## 2022-12-20 DIAGNOSIS — E860 Dehydration: Secondary | ICD-10-CM | POA: Diagnosis present

## 2022-12-20 DIAGNOSIS — R4182 Altered mental status, unspecified: Secondary | ICD-10-CM | POA: Diagnosis present

## 2022-12-20 DIAGNOSIS — Z923 Personal history of irradiation: Secondary | ICD-10-CM | POA: Diagnosis not present

## 2022-12-20 DIAGNOSIS — M6281 Muscle weakness (generalized): Secondary | ICD-10-CM | POA: Diagnosis not present

## 2022-12-20 DIAGNOSIS — I10 Essential (primary) hypertension: Secondary | ICD-10-CM | POA: Diagnosis not present

## 2022-12-20 DIAGNOSIS — Z8249 Family history of ischemic heart disease and other diseases of the circulatory system: Secondary | ICD-10-CM | POA: Diagnosis not present

## 2022-12-20 DIAGNOSIS — L89152 Pressure ulcer of sacral region, stage 2: Secondary | ICD-10-CM | POA: Diagnosis present

## 2022-12-20 DIAGNOSIS — N400 Enlarged prostate without lower urinary tract symptoms: Secondary | ICD-10-CM | POA: Diagnosis not present

## 2022-12-20 DIAGNOSIS — Z86711 Personal history of pulmonary embolism: Secondary | ICD-10-CM | POA: Diagnosis not present

## 2022-12-20 DIAGNOSIS — G9341 Metabolic encephalopathy: Secondary | ICD-10-CM | POA: Diagnosis present

## 2022-12-20 DIAGNOSIS — Z515 Encounter for palliative care: Secondary | ICD-10-CM | POA: Diagnosis not present

## 2022-12-20 DIAGNOSIS — Z823 Family history of stroke: Secondary | ICD-10-CM | POA: Diagnosis not present

## 2022-12-20 DIAGNOSIS — J984 Other disorders of lung: Secondary | ICD-10-CM | POA: Diagnosis not present

## 2022-12-20 DIAGNOSIS — Z833 Family history of diabetes mellitus: Secondary | ICD-10-CM | POA: Diagnosis not present

## 2022-12-20 DIAGNOSIS — G8929 Other chronic pain: Secondary | ICD-10-CM | POA: Diagnosis present

## 2022-12-20 LAB — CBC
HCT: 29.3 % — ABNORMAL LOW (ref 39.0–52.0)
Hemoglobin: 8.9 g/dL — ABNORMAL LOW (ref 13.0–17.0)
MCH: 29.3 pg (ref 26.0–34.0)
MCHC: 30.4 g/dL (ref 30.0–36.0)
MCV: 96.4 fL (ref 80.0–100.0)
Platelets: 146 10*3/uL — ABNORMAL LOW (ref 150–400)
RBC: 3.04 MIL/uL — ABNORMAL LOW (ref 4.22–5.81)
RDW: 15.8 % — ABNORMAL HIGH (ref 11.5–15.5)
WBC: 5.2 10*3/uL (ref 4.0–10.5)
nRBC: 0 % (ref 0.0–0.2)

## 2022-12-20 LAB — URINALYSIS, W/ REFLEX TO CULTURE (INFECTION SUSPECTED)
Bilirubin Urine: NEGATIVE
Glucose, UA: NEGATIVE mg/dL
Ketones, ur: NEGATIVE mg/dL
Nitrite: NEGATIVE
Protein, ur: 30 mg/dL — AB
RBC / HPF: >50 RBC/hpf (ref 0–5)
Specific Gravity, Urine: 1.015 (ref 1.005–1.030)
WBC, UA: >50 WBC/hpf (ref 0–5)
pH: 5 (ref 5.0–8.0)

## 2022-12-20 LAB — BASIC METABOLIC PANEL
Anion gap: 6 (ref 5–15)
BUN: 21 mg/dL (ref 8–23)
CO2: 28 mmol/L (ref 22–32)
Calcium: 9.1 mg/dL (ref 8.9–10.3)
Chloride: 106 mmol/L (ref 98–111)
Creatinine, Ser: 0.62 mg/dL (ref 0.61–1.24)
GFR, Estimated: >60 mL/min (ref >60)
Glucose, Bld: 113 mg/dL — ABNORMAL HIGH (ref 70–99)
Potassium: 3 mmol/L — ABNORMAL LOW (ref 3.5–5.1)
Sodium: 140 mmol/L (ref 135–145)

## 2022-12-20 MED ORDER — METOPROLOL TARTRATE 25 MG PO TABS
25.0000 mg | ORAL_TABLET | Freq: Two times a day (BID) | ORAL | Status: DC
  Administered 2022-12-20 – 2022-12-24 (×8): 25 mg via ORAL
  Filled 2022-12-20 (×8): qty 1

## 2022-12-20 NOTE — Assessment & Plan Note (Signed)
Patient with history of multiple UTI, prior urine culture from 12/11/2022 which showed Morganella MDR, sensitive to cefepime, imipenem.  Unable to explain any urinary symptoms but no leukocytosis or fever. History of multiple different antibiotics use. UA and urine culture were ordered on admission but unfortunately never done until this morning, patient was already on cefepime. Might be just colonization. -Continue with cefepime for now -Follow-up culture results

## 2022-12-20 NOTE — Assessment & Plan Note (Signed)
Patient was in A-fib with RVR, fluctuating heart rate which increases with some activity. -Increasing the dose of metoprolol to 25 mg twice daily -Continue with Eliquis

## 2022-12-20 NOTE — Assessment & Plan Note (Signed)
Likely multifactorial with overall decline.  May be recurrent UTI.  Patient was also on multiple sedating medications which are likely the cause of more sedation and altered mental status.  Doses were adjusted and backing off on some of the pain medications on admission. Patient still quite lethargic Overall failure to thrive with advanced malignancy and recurrent hospitalization, not a candidate for further treatment per oncology and they have already advised hospice. -Palliative care was also consulted

## 2022-12-20 NOTE — Hospital Course (Addendum)
Taken from H&P.  Dean [name]. is a 87 y.o. male with medical history significant of multiple myeloma end-stage, HTN, BPH, PAF on Eliquis, PE on Eliquis, who was just recently discharged from hospital to nursing home, sent back from nursing home today for evaluation of worsening of confusion and possible UTI failed outpatient management.   Patient was hospitalized 3 weeks ago for treatment of hypoxia and aspiration pneumonia. Discharged to nursing home and patient continued on p.o. antibiotics for the pneumonia for another 7 days. Initially patient recovered well but about 1 week ago patient started develop lethargy, generalized weakness and strong smell of urine and rehab center started him on IV antibiotic and today will be the last day. Despite, patient continues to have daytime sleepiness, generalized weakness and significant decreased oral intake for 7 days. No fever as per daughter.  No obvious urinary symptoms.  On presentation patient was febrile at 100, HR 120, EKG A-fib with RVR, softer blood pressure at 97/73, lactic acid 2.8, no leukocytosis. Also found to have pressure injury, stage II on sacral and a shallow ulcer on left lateral ankle.  Patient was given cefepime, vancomycin and Flagyl in ED. continued on cefepime  Per SNF record, urine cultures from 12/11/2022 showed  Morganella MDR, sensitive to cefepime, imipenem.  Patient was also on multiple sedating medications which were cut back.  11/16: Afebrile, heart rate in low 100s, lactic acidosis resolved, troponin remain negative, preliminary blood cultures negative in 24-hour, UA and urine cultures were ordered but unfortunately never sent on admission and patient is currently on antibiotic.  No leukocytosis, hemoglobin decreased to 8.9 but all cell line decreased, likely some dilutional effect. UA done this morning with leukocytosis, microscopic hematuria and bacteria, cultures pending now.  Patient with progressive decline,  history of end-stage multiple myeloma and has been off from chemotherapy for the past couple of month.  Palliative care from cancer center and his oncologist has already suggested hospice but apparently patient declined at that time.  Per daughter he was just thinking that hospice mean only 2 to 3 days of life.  Poor p.o. intake and recurrent UTI, unable to explain any urinary symptoms.  Increased lethargy can be due to sedating medications.  Overall patient is high risk for mortality and is hospice appropriate based on advanced age and underlying life limiting comorbidities.  Palliative care was consulted after discussing with daughter who is his POA .  11/17: Vitals stable, pending urine culture.  Appetite remained poor.

## 2022-12-20 NOTE — Assessment & Plan Note (Signed)
-  Continue Neurontin-dose was decreased from 400 to 300 mg during current hospitalization.

## 2022-12-20 NOTE — Assessment & Plan Note (Signed)
Oncology has stopped treatment over the past few month due to overall decline, poor response and they advise for hospice which he initially declined. Overall progressive decline. -Palliative care was consulted -Continue with pain management-doses were decreased to see if that will affect his mentation

## 2022-12-20 NOTE — Evaluation (Addendum)
Occupational Therapy Evaluation Patient Details Name: Dean [name]. MRN: [id number] DOB: April 05, 1935 Today's Date: 12/20/2022   History of Present Illness Dean [name]. is a 87 y.o. male with medical history significant of multiple myeloma end-stage, HTN, BPH, PAF on Eliquis, PE on Eliquis, who was just recently discharged from hospital to nursing home, sent back from nursing home today for evaluation of worsening of confusion and possible UTI failed outpatient management.   Clinical Impression   Patient seen for evaluation. He was agreeable to OT/PT co-treatment to maximize safety and participation.  Patient is likely a poor historian. He reported being at rehab for "a couple weeks" then discharged back to his daughter's house. Per EMR, pt was at [facility]. According to OT eval from 11/26/22, pt performed stand pivot to [facility] at most with RW and receives assistance for ADLs (sponge bathing, LB dressing, toileting). Patient presenting to OT with decreased safety awareness, impaired balance, decreased strength, and decreased endurance limiting independence in ADLs and functional mobility. During evaluation, he required Max A x1 for supine to sit, Max A x2 for sit to supine, Mod-Max A x2 + RW for STS from EOB and to take side steps toward [facility]. For ADLs, he required Max A for LB dressing, Total A for posterior hygiene, and set up A for seated grooming tasks. After toileting, pt requires Max A x2 + RW for stand pivot transfer back to EOB 2/2 fatigue. Patient received/left in bed with all needs in reach. Recommend skilled acute OT services to address deficits noted below. Pt would benefit from ongoing therapy upon discharge to maximize safety and independence with ADLs, functional mobility, decrease fall risk, and decrease caregiver burden.      If plan is discharge home, recommend the following: Two people to help with walking and/or transfers; Assistance with cooking/housework; Assist for  transportation; Help with stairs or ramp for entrance; Direct supervision/assist for medications management; Direct supervision/assist for financial management; Two people to help with bathing/dressing/bathroom; Supervision due to cognitive status     Functional Status Assessment  Patient has had a recent decline in their functional status and demonstrates the ability to make significant improvements in function in a reasonable and predictable amount of time.  Equipment Recommendations   (defer to post acute)    Recommendations for Other Services       Precautions / Restrictions Precautions Precautions: Fall Restrictions Weight Bearing Restrictions: No      Mobility Bed Mobility Overal bed mobility: Needs Assistance Bed Mobility: Supine to Sit, Sit to Supine     Supine to sit: Max assist, HOB elevated, Used rails (Max A x1) Sit to supine: Max assist, +2 for physical assistance        Transfers Overall transfer level: Needs assistance Equipment used: Rolling walker (2 wheels) Transfers: Sit to/from Stand, Bed to chair/wheelchair/BSC Sit to Stand: Mod assist, Max assist, +2 physical assistance (STS from EOB Mod-Max A x2 + RW.) Stand pivot transfers: Max assist, +2 physical assistance (Stand pivot transfer from BSC>EOB Max A x2 + RW.)                Balance Overall balance assessment: Needs assistance   Sitting balance-Leahy Scale: Fair Sitting balance - Comments: SBA-CGA for safety, forward lean while sitting on BSC. Pt tolerated sitting EOB for ~5 min. Postural control: Other (comment) (anterior lean)   Standing balance-Leahy Scale: Poor       ADL either performed or assessed with clinical judgement  ADL Overall ADL's : Needs assistance/impaired Eating/Feeding: Set up;Sitting   Grooming: Set up;Wash/dry face;Sitting Grooming Details (indicate cue type and reason): Pt completed face hygiene while sitting EOB with set up A for task. SBA-CGA required for  sitting balance.   Lower Body Dressing: Maximal assistance;Sitting/lateral leans Lower Body Dressing Details (indicate cue type and reason): To don B socks at EOB.  Toilet Transfer: Rolling walker (2 wheels);Stand-pivot;Ambulation;BSC/3in1 Toilet Transfer Details (indicate cue type and reason): Short ambulatory transfer from [facility] with Mod-Max A x2 + RW. Stand pivot transfer completed from BSC>EOB with Max A x2 + RW. Toileting- Clothing Manipulation and Hygiene: Total assistance;Sit to/from stand Toileting - Clothing Manipulation Details (indicate cue type and reason): Total A for posterior hygiene after continent void in [facility].     Functional mobility during ADLs: Moderate assistance;Maximal assistance;+2 for physical assistance;Rolling walker (2 wheels) (Mod-Max A x2 to take side steps from EOB>BSC. Pt demonstrated difficulty lifting BLEs to take steps, B foot drop noted.)       Vision Patient Visual Report: No change from baseline       Perception         Praxis         Pertinent Vitals/Pain Pain Assessment Pain Assessment: Faces Faces Pain Scale: Hurts a little bit Pain Location: RUE Pain Descriptors / Indicators: Discomfort, Sore Pain Intervention(s): Limited activity within patient's tolerance, Monitored during session, Repositioned     Extremity/Trunk Assessment Upper Extremity Assessment Upper Extremity Assessment: Generalized weakness   Lower Extremity Assessment Lower Extremity Assessment: Generalized weakness       Communication Communication Communication: Hearing impairment Cueing Techniques: Verbal cues   Cognition Arousal: Alert Behavior During Therapy: WFL for tasks assessed/performed Overall Cognitive Status: No family/caregiver present to determine baseline cognitive functioning       General Comments: Pt A&O to self, "hospital", month/year, [place], and reported he came to hospital because he "couldn't walk." Intermittent confusion. Mod VC  for safety awareness throughout mobility.     General Comments  Pt endorsed dizziness upon sitting on BSC that improved with time. HR increased to 160s with activity, SpO2 maintained >90% on RA. RN present to assess pt.    Exercises Other Exercises Other Exercises: OT provided education re: role of OT, OT POC, post acute recs, sitting up for all meals, EOB/OOB mobility with assistance, home/fall safety.     Shoulder Instructions      Home Living Family/patient expects to be discharged to:: Private residence Living Arrangements: Children Available Help at Discharge: Family;Available 24 hours/day Type of Home: House Home Access: Ramped entrance     Home Layout: One level     Bathroom Shower/Tub: Sponge bathes at baseline   Bathroom Toilet: Standard Bathroom Accessibility: Yes   Home Equipment: Rolling Walker (2 wheels);BSC/3in1;Cane - single point;Wheelchair - manual   Additional Comments: Per chart review, pt was at [facility]. Pt reports being "at rehab for a couple weeks," but then went back to daughter's house. Pt likely poor historian.      Prior Functioning/Environment Prior Level of Function : Needs assist;History of Falls (last six months)       Physical Assist : Mobility (physical);ADLs (physical)     Mobility Comments: From OT eval on 11/26/22: "Needs assist, performed stand pivot to [facility] at most, reports he has not been ambulatory since the end of july, 2024. endorses 3x falls ~5 months ago." ADLs Comments: From OT eval on 11/26/22: "Needs assist for sponge bath and bathroom use at baseline."  Pt also reported he receives assistance from daughter/son-in-law for LB dressing.        OT Problem List: Decreased strength;Decreased range of motion;Decreased activity tolerance;Decreased safety awareness;Impaired balance (sitting and/or standing);Decreased knowledge of use of DME or AE;Cardiopulmonary status limiting activity;Pain;Obesity      OT  Treatment/Interventions: Self-care/ADL training;Therapeutic exercise;Neuromuscular education;Energy conservation;DME and/or AE instruction;Manual therapy;Modalities;Therapeutic activities;Cognitive remediation/compensation;Patient/family education;Balance training    OT Goals(Current goals can be found in the care plan section) Acute Rehab OT Goals Patient Stated Goal: return home OT Goal Formulation: With patient Time For Goal Achievement: 01/03/23 Potential to Achieve Goals: Fair  OT Frequency: Min 1X/week    Co-evaluation PT/OT/SLP Co-Evaluation/Treatment: Yes Reason for Co-Treatment: Complexity of the patient's impairments (multi-system involvement);For patient/therapist safety;To address functional/ADL transfers PT goals addressed during session: Mobility/safety with mobility;Balance;Proper use of DME OT goals addressed during session: ADL's and self-care;Proper use of Adaptive equipment and DME      AM-PAC OT "6 Clicks" Daily Activity     Outcome Measure Help from another person eating meals?: A Little Help from another person taking care of personal grooming?: A Little Help from another person toileting, which includes using toliet, bedpan, or urinal?: Total Help from another person bathing (including washing, rinsing, drying)?: A Lot Help from another person to put on and taking off regular upper body clothing?: A Little Help from another person to put on and taking off regular lower body clothing?: A Lot 6 Click Score: 14   End of Session Equipment Utilized During Treatment: Gait belt;Rolling walker (2 wheels) Nurse Communication: Mobility status  Activity Tolerance: Patient tolerated treatment well;Patient limited by fatigue Patient left: in bed;with call bell/phone within reach;with bed alarm set                   Time: 9604-5409 OT Time Calculation (min): 39 min Charges:  OT General Charges $OT Visit: 1 Visit OT Evaluation $OT Eval Moderate Complexity: 1  Mod  [facility] MS, OTR/L ascom [phone]  12/20/22, 12:52 PM

## 2022-12-20 NOTE — Assessment & Plan Note (Signed)
Family requested no cath for urine as patient sustained severe ureteral damage on the recent Foley insertion in summer this year. -Continue Flomax

## 2022-12-20 NOTE — Evaluation (Signed)
Physical Therapy Evaluation Patient Details Name: Riyon Mcilroy. MRN: [id number] DOB: 1935-04-03 Today's Date: 12/20/2022  History of Present Illness  Redd Rende. is a 87 y.o. male with medical history significant of multiple myeloma end-stage, HTN, BPH, PAF on Eliquis, PE on Eliquis, who was just recently discharged from hospital to nursing home, sent back from nursing home today for evaluation of worsening of confusion and possible UTI failed outpatient management.   Clinical Impression  Co-evaluation/treatment today with OT due to complexity of patient's condition and safety for patient and clinicians. Patient received after sitting up with OT. He was likely a poor historian and reported going back to his daughter's house after 2 weeks in rehab. From chart review it appears he was recently at [facility] and was completing pivot transfers for mobility. Upon PT evaluation, patient required max A +2 for bed mobility and transfers and was unable to ambulate more than a couple of steps with RW. With attempts to take steps he was unable to clear the floor due to foot drop and inappropriately moved the walker and his feet in unhelpful direction to be able to safely complete a transfer. He currently requires nearly dependent to max +2 level of assist for even basic mobility and was also limited by tachycardia that increased to the 160s during session. Patient would benefit from skilled physical therapy to address impairments and functional limitations (see PT Problem List below) to work towards stated goals and return to PLOF or maximal functional independence.        If plan is discharge home, recommend the following: Two people to help with walking and/or transfers;Two people to help with bathing/dressing/bathroom;Help with stairs or ramp for entrance;Supervision due to cognitive status;Assistance with cooking/housework;Assist for transportation (patient requires SNF level assistance  currently)   Can travel by private vehicle   No    Equipment Recommendations Hospital bed  Recommendations for Other Services       Functional Status Assessment Patient has had a recent decline in their functional status and demonstrates the ability to make significant improvements in function in a reasonable and predictable amount of time.     Precautions / Restrictions Precautions Precautions: Fall Restrictions Weight Bearing Restrictions: No      Mobility  Bed Mobility Overal bed mobility: Needs Assistance Bed Mobility: Sit to Supine       Sit to supine: Max assist, +2 for physical assistance        Transfers Overall transfer level: Needs assistance Equipment used: Rolling walker (2 wheels) Transfers: Sit to/from Stand, Bed to chair/wheelchair/BSC Sit to Stand: Mod assist, Max assist, +2 physical assistance Stand pivot transfers: Max assist, +2 physical assistance         General transfer comment: Patient completed sit <> stand from bed to [facility] max A+2. He did not maintain upright standing posture and had difficulty advancing feet due to bilateral foot drop. He has required cuing to move his feet and RW in an appropriate direction. The last 25% of the transfer was more of a pivot.  He completed stand pivot transfer BSC back to bed with max A+2, attempting to use the RW but without sufficent foot movement.    Ambulation/Gait               General Gait Details: not able to take more than a couple short steps laterally, moves walker away from body and steps in inappropriate direction and is unaware that it is ineffective.  Stairs  Wheelchair Mobility     Tilt Bed    Modified Rankin (Stroke Patients Only)       Balance Overall balance assessment: Needs assistance Sitting-balance support: Bilateral upper extremity supported Sitting balance-Leahy Scale: Fair Sitting balance - Comments: SBA-CGA for safety, forward lean while sitting on  BSC. Pt tolerated sitting EOB for ~5 min. Postural control: Other (comment) (anterior lean even when he thought he was leaning backwards)   Standing balance-Leahy Scale: Poor                               Pertinent Vitals/Pain Pain Assessment Pain Assessment: Faces Faces Pain Scale: Hurts a little bit Pain Location: RUE Pain Descriptors / Indicators: Discomfort, Sore Pain Intervention(s): Limited activity within patient's tolerance, Monitored during session, Repositioned    Home Living Family/patient expects to be discharged to:: Private residence Living Arrangements: Children Available Help at Discharge: Family;Available 24 hours/day Type of Home: House Home Access: Ramped entrance       Home Layout: One level Home Equipment: Rolling Walker (2 wheels);BSC/3in1;Cane - single point;Wheelchair - manual Additional Comments: Per chart review, pt was at [facility]. Pt reports being "at rehab for a couple weeks," but then went back to daughter's house. Pt likely poor historian.    Prior Function Prior Level of Function : Needs assist;History of Falls (last six months)       Physical Assist : Mobility (physical);ADLs (physical)     Mobility Comments: From OT eval on 11/26/22: "Needs assist, performed stand pivot to [facility] at most, reports he has not been ambulatory since the end of july, 2024. endorses 3x falls ~5 months ago." ADLs Comments: From OT eval on 11/26/22: "Needs assist for sponge bath and bathroom use at baseline." Pt also reported he receives assistance from daughter/son-in-law for LB dressing.     Extremity/Trunk Assessment   Upper Extremity Assessment Upper Extremity Assessment: Generalized weakness    Lower Extremity Assessment Lower Extremity Assessment:  (does not fully extend hips in standing,  demonstrates bilateral foot drop and inability to lift toes/foot from floor when attemting to take steps bilaterally)       Communication    Communication Communication: Hearing impairment Cueing Techniques: Verbal cues;Gestural cues;Tactile cues  Cognition Arousal: Alert Behavior During Therapy: WFL for tasks assessed/performed Overall Cognitive Status: No family/caregiver present to determine baseline cognitive functioning                                 General Comments: Intermittent confusion during session. Mod VC for safety awareness throughout mobility.        General Comments General comments (skin integrity, edema, etc.): Patient reported dizziness when sitting on BSC and HR increased to 160s with activities with variable rate. SpO2 stayed approx 98% on room air. RN came in room to assess patient.    Exercises Other Exercises Other Exercises: educated patient on safe transfer techniques and practiced them.   Assessment/Plan    PT Assessment Patient needs continued PT services  PT Problem List Decreased strength;Decreased mobility;Decreased safety awareness;Decreased knowledge of precautions;Decreased coordination;Decreased cognition;Cardiopulmonary status limiting activity;Decreased activity tolerance;Decreased balance;Decreased knowledge of use of DME;Pain       PT Treatment Interventions DME instruction;Therapeutic exercise;Gait training;Balance training;Stair training;Neuromuscular re-education;Functional mobility training;Cognitive remediation;Therapeutic activities;Patient/family education    PT Goals (Current goals can be found in the Care Plan section)  Acute Rehab PT  Goals PT Goal Formulation: Patient unable to participate in goal setting    Frequency Min 2X/week     Co-evaluation   Reason for Co-Treatment: Complexity of the patient's impairments (multi-system involvement);For patient/therapist safety;To address functional/ADL transfers PT goals addressed during session: Mobility/safety with mobility;Balance;Proper use of DME;Strengthening/ROM OT goals addressed during session: ADL's  and self-care;Proper use of Adaptive equipment and DME       AM-PAC PT "6 Clicks" Mobility  Outcome Measure Help needed turning from your back to your side while in a flat bed without using bedrails?: A Lot Help needed moving from lying on your back to sitting on the side of a flat bed without using bedrails?: A Lot Help needed moving to and from a bed to a chair (including a wheelchair)?: A Lot Help needed standing up from a chair using your arms (e.g., wheelchair or bedside chair)?: A Lot Help needed to walk in hospital room?: Total Help needed climbing 3-5 steps with a railing? : Total 6 Click Score: 10    End of Session Equipment Utilized During Treatment: Gait belt Activity Tolerance: Treatment limited secondary to medical complications (Comment) (limited by tachycardia and feeling dizzy) Patient left: with call bell/phone within reach;with bed alarm set;in bed;with nursing/sitter in room Nurse Communication: Mobility status PT Visit Diagnosis: Muscle weakness (generalized) (M62.81);Other abnormalities of gait and mobility (R26.89)    Time: 1005-1035 PT Time Calculation (min) (ACUTE ONLY): 30 min   Charges:   PT Evaluation $PT Eval Moderate Complexity: 1 Mod   PT General Charges $$ ACUTE PT VISIT: 1 Visit         Huntley Dec R. Ilsa Iha, PT, DPT 12/20/22, 1:39 PM

## 2022-12-20 NOTE — Progress Notes (Signed)
Progress Note   Patient: Dean [name]. VVO:[id number] DOB: Apr 10, 1935 DOA: 12/19/2022     0 DOS: the patient was seen and examined on 12/20/2022   Brief hospital course: Taken from H&P.  Dean [name]. is a 87 y.o. male with medical history significant of multiple myeloma end-stage, HTN, BPH, PAF on Eliquis, PE on Eliquis, who was just recently discharged from hospital to nursing home, sent back from nursing home today for evaluation of worsening of confusion and possible UTI failed outpatient management.   Patient was hospitalized 3 weeks ago for treatment of hypoxia and aspiration pneumonia. Discharged to nursing home and patient continued on p.o. antibiotics for the pneumonia for another 7 days. Initially patient recovered well but about 1 week ago patient started develop lethargy, generalized weakness and strong smell of urine and rehab center started him on IV antibiotic and today will be the last day. Despite, patient continues to have daytime sleepiness, generalized weakness and significant decreased oral intake for 7 days. No fever as per daughter.  No obvious urinary symptoms.  On presentation patient was febrile at 100, HR 120, EKG A-fib with RVR, softer blood pressure at 97/73, lactic acid 2.8, no leukocytosis. Also found to have pressure injury, stage II on sacral and a shallow ulcer on left lateral ankle.  Patient was given cefepime, vancomycin and Flagyl in ED. continued on cefepime  Per SNF record, urine cultures from 12/11/2022 showed  Morganella MDR, sensitive to cefepime, imipenem.  Patient was also on multiple sedating medications which were cut back.  11/16: Afebrile, heart rate in low 100s, lactic acidosis resolved, troponin remain negative, preliminary blood cultures negative in 24-hour, UA and urine cultures were ordered but unfortunately never sent on admission and patient is currently on antibiotic.  No leukocytosis, hemoglobin decreased to 8.9 but all cell line  decreased, likely some dilutional effect. UA done this morning with leukocytosis, microscopic hematuria and bacteria, cultures pending now.  Patient with progressive decline, history of end-stage multiple myeloma and has been off from chemotherapy for the past couple of month.  Palliative care from cancer center and his oncologist has already suggested hospice but apparently patient declined at that time.  Per daughter he was just thinking that hospice mean only 2 to 3 days of life.  Poor p.o. intake and recurrent UTI, unable to explain any urinary symptoms.  Increased lethargy can be due to sedating medications.  Overall patient is high risk for mortality and is hospice appropriate based on advanced age and underlying life limiting comorbidities.  Palliative care was consulted after discussing with daughter who is his POA .     Assessment and Plan: * AMS (altered mental status) Likely multifactorial with overall decline.  May be recurrent UTI.  Patient was also on multiple sedating medications which are likely the cause of more sedation and altered mental status.  Doses were adjusted and backing off on some of the pain medications on admission. Patient still quite lethargic Overall failure to thrive with advanced malignancy and recurrent hospitalization, not a candidate for further treatment per oncology and they have already advised hospice. -Palliative care was also consulted   UTI (urinary tract infection) Patient with history of multiple UTI, prior urine culture from 12/11/2022 which showed Morganella MDR, sensitive to cefepime, imipenem.  Unable to explain any urinary symptoms but no leukocytosis or fever. History of multiple different antibiotics use. UA and urine culture were ordered on admission but unfortunately never done until this morning, patient  was already on cefepime. Might be just colonization. -Continue with cefepime for now -Follow-up culture results  Multiple myeloma  without remission [facility]) Oncology has stopped treatment over the past few month due to overall decline, poor response and they advise for hospice which he initially declined. Overall progressive decline. -Palliative care was consulted -Continue with pain management-doses were decreased to see if that will affect his mentation  Failure to thrive in adult Poor p.o. intake and progressive decline which is consistent with failure to thrive over the past couple of month. -Palliative care consult  Chronic atrial fibrillation with RVR (HCC) Patient was in A-fib with RVR, fluctuating heart rate which increases with some activity. -Increasing the dose of metoprolol to 25 mg twice daily -Continue with Eliquis  BPH (benign prostatic hyperplasia) Family requested no cath for urine as patient sustained severe ureteral damage on the recent Foley insertion in summer this year. -Continue Flomax  Neuropathy (HCC) -Continue Neurontin-dose was decreased from 400 to 300 mg during current hospitalization.        Subjective: Somnolent and lethargic gentleman, denies any pain.  Per nursing staff he was little more alert this morning and did receive a pain pill.  Physical Exam: Vitals:   12/19/22 2258 12/19/22 2300 12/20/22 0306 12/20/22 0821  BP: 128/74  121/68 (!) 115/54  Pulse: (!) 111 (!) 59 (!) 108 (!) 103  Resp: 20  20 20   Temp: 97.8 F (36.6 C)  97.6 F (36.4 C) 97.7 F (36.5 C)  TempSrc: Oral     SpO2: 95% 94% 97% 95%  Weight:       General.  Frail and lethargic elderly man, in no acute distress. Pulmonary.  Lungs clear bilaterally, normal respiratory effort. CV.  Irregularly irregular Abdomen.  Soft, nontender, nondistended, BS positive. CNS.  Somnolent, no apparent deficit Extremities.  No edema, no cyanosis, pulses intact and symmetrical.  Data Reviewed: Prior data reviewed  Family Communication: Discussed with daughter at bedside  Disposition: Status is: Inpatient Remains  inpatient appropriate because: Severity of illness  Planned Discharge Destination:  To be determined, appropriate for hospice,  DVT prophylaxis.  Eliquis Time spent: 50 minutes  This record has been created using Conservation officer, historic buildings. Errors have been sought and corrected,but may not always be located. Such creation errors do not reflect on the standard of care.   Author: Arnetha Courser, MD 12/20/2022 1:28 PM  For on call review [email or web address].

## 2022-12-20 NOTE — Assessment & Plan Note (Signed)
Poor p.o. intake and progressive decline which is consistent with failure to thrive over the past couple of month. -Palliative care consult

## 2022-12-21 DIAGNOSIS — C9000 Multiple myeloma not having achieved remission: Secondary | ICD-10-CM | POA: Diagnosis not present

## 2022-12-21 DIAGNOSIS — A419 Sepsis, unspecified organism: Secondary | ICD-10-CM | POA: Diagnosis not present

## 2022-12-21 DIAGNOSIS — R627 Adult failure to thrive: Secondary | ICD-10-CM | POA: Diagnosis not present

## 2022-12-21 DIAGNOSIS — N390 Urinary tract infection, site not specified: Secondary | ICD-10-CM | POA: Diagnosis not present

## 2022-12-21 DIAGNOSIS — R400 Somnolence: Secondary | ICD-10-CM | POA: Diagnosis not present

## 2022-12-21 DIAGNOSIS — Z515 Encounter for palliative care: Secondary | ICD-10-CM | POA: Diagnosis not present

## 2022-12-21 MED ORDER — ADULT MULTIVITAMIN W/MINERALS CH
1.0000 | ORAL_TABLET | Freq: Every day | ORAL | Status: DC
  Administered 2022-12-21 – 2022-12-24 (×4): 1 via ORAL
  Filled 2022-12-21 (×4): qty 1

## 2022-12-21 MED ORDER — JUVEN PO PACK
1.0000 | PACK | Freq: Two times a day (BID) | ORAL | Status: DC
Start: 2022-12-21 — End: 2022-12-24
  Administered 2022-12-21 – 2022-12-24 (×8): 1 via ORAL

## 2022-12-21 NOTE — Assessment & Plan Note (Signed)
Patient with history of multiple UTI, prior urine culture from 12/11/2022 which showed Morganella MDR, sensitive to cefepime, imipenem.  Unable to explain any urinary symptoms but no leukocytosis or fever. History of multiple different antibiotics use. UA and urine culture were ordered on admission but unfortunately never done until this morning, patient was already on cefepime. Might be just colonization. -Continue with cefepime for now -Follow-up culture results

## 2022-12-21 NOTE — Assessment & Plan Note (Signed)
More alert and talking appropriately today. Likely multifactorial with overall decline.  May be recurrent UTI.  Patient was also on multiple sedating medications which are likely the cause of more sedation and altered mental status.  Doses were adjusted and backing off on some of the pain medications on admission. Overall failure to thrive with advanced malignancy and recurrent hospitalization, not a candidate for further treatment per oncology and they have already advised hospice. -Palliative care was also consulted

## 2022-12-21 NOTE — Progress Notes (Signed)
Initial Nutrition Assessment  DOCUMENTATION CODES:   Not applicable  INTERVENTION:  Liberalized diet Continue- Ensure Plus High Protein po BID, each supplement provides 350 kcal and 20 grams of protein. -1 packet Juven BID, each packet provides 95 calories, 2.5 grams of protein (collagen), and 9.8 grams of carbohydrate (3 grams sugar); also contains 7 grams of L-arginine and L-glutamine, 300 mg vitamin C, 15 mg vitamin E, 1.2 mcg vitamin B-12, 9.5 mg zinc, 200 mg calcium, and 1.5 g  Calcium Beta-hydroxy-Beta-methylbutyrate to support wound healing Daily MVI   NUTRITION DIAGNOSIS:   Increased nutrient needs related to chronic illness as evidenced by estimated needs.  GOAL:   Patient will meet greater than or equal to 90% of their needs    MONITOR:   PO intake, Supplement acceptance, Weight trends, Skin  REASON FOR ASSESSMENT:   Consult Assessment of nutrition requirement/status  ASSESSMENT:  87 y.o. M Admitted from Nursing home with Altered mental status, UTI, CHF. PMH: multiple myeloma end-stage, HTN, BPH, PAF on Eliquis, PE on Eliquis, GERD, colonic diverticular abscess, ETOH abuse, BPH,  who was just recently discharged from hospital to nursing home. Review of EMR revealed: Recent hospital admission ~ 3 weeks ago in relation to hypoxia, and aspiration pneumonia. Was then discharged to nursing home with continued 7 day oral antibiotics.  MD reports that patient has progressive decline with history of end-stage multiple myeloma. Family possible GOC will be Hospice, Palliative care consulted. Patient note with overall decline along with oral intake. Although trending stable at this time. Reached out to patients room with no answer.  There is no benefit to excessive dietary restrictions related advanced age, declined oral intake and increased nutrient needs . Patient would benefit better from liberalized diet to help meet increased nutrient needs and promote better oral  intake.  Admit weight: 86.6 Kg Current weight: 86.6 kg  Weight history: 12/19/22 86.6 kg  11/27/22 87.2 kg  11/10/22 91.5 kg  10/23/22 86.7 kg  09/29/22 86.2 kg  09/16/22 85.3 kg  09/09/22 85.3 kg  08/19/22 87 kg  08/14/22 87 kg  08/13/22 85.8 kg      Average Meal Intake: 0-50: 25% intake x 3 recorded meals  Nutritionally Relevant Medications: Scheduled Meds:  ALPRAZolam  0.25 mg Oral QHS   apixaban  5 mg Oral BID   feeding supplement  237 mL Oral TID BM   ferrous sulfate  325 mg Oral Daily   gabapentin  300 mg Oral BID   metoprolol tartrate  25 mg Oral BID   trazodone  100 mg Oral QHS    Labs Reviewed: 11/16- K 3.0    NUTRITION - FOCUSED PHYSICAL EXAM:  Deferred   Diet Order:   Diet Order             Diet regular Room service appropriate? Yes; Fluid consistency: Thin  Diet effective now                   EDUCATION NEEDS:   Not appropriate for education at this time  Skin:  Skin Assessment: Skin Integrity Issues: Skin Integrity Issues:: Stage III Stage III: Sacrum  Last BM:  12/19/2022  Height:   Ht Readings from Last 1 Encounters:  11/24/22 5\' 8"  (1.727 m)    Weight:   Wt Readings from Last 1 Encounters:  12/19/22 86.6 kg    Ideal Body Weight:     BMI:  Body mass index is 29.04 kg/m.  Estimated Nutritional Needs:   Kcal:  4259-5638 kcal/d  Protein:  90-105 g/d  Fluid:  1750-2100 ml/d    Jamelle Haring RDN, LDN Clinical Dietitian  RDN pager # available on Amion

## 2022-12-21 NOTE — Progress Notes (Signed)
Progress Note   Patient: Dean [name]. NWG:[id number] DOB: December 30, 1935 DOA: 12/19/2022     1 DOS: the patient was seen and examined on 12/21/2022   Brief hospital course: Taken from H&P.  Lealand Rizk. is a 87 y.o. male with medical history significant of multiple myeloma end-stage, HTN, BPH, PAF on Eliquis, PE on Eliquis, who was just recently discharged from hospital to nursing home, sent back from nursing home today for evaluation of worsening of confusion and possible UTI failed outpatient management.   Patient was hospitalized 3 weeks ago for treatment of hypoxia and aspiration pneumonia. Discharged to nursing home and patient continued on p.o. antibiotics for the pneumonia for another 7 days. Initially patient recovered well but about 1 week ago patient started develop lethargy, generalized weakness and strong smell of urine and rehab center started him on IV antibiotic and today will be the last day. Despite, patient continues to have daytime sleepiness, generalized weakness and significant decreased oral intake for 7 days. No fever as per daughter.  No obvious urinary symptoms.  On presentation patient was febrile at 100, HR 120, EKG A-fib with RVR, softer blood pressure at 97/73, lactic acid 2.8, no leukocytosis. Also found to have pressure injury, stage II on sacral and a shallow ulcer on left lateral ankle.  Patient was given cefepime, vancomycin and Flagyl in ED. continued on cefepime  Per SNF record, urine cultures from 12/11/2022 showed  Morganella MDR, sensitive to cefepime, imipenem.  Patient was also on multiple sedating medications which were cut back.  11/16: Afebrile, heart rate in low 100s, lactic acidosis resolved, troponin remain negative, preliminary blood cultures negative in 24-hour, UA and urine cultures were ordered but unfortunately never sent on admission and patient is currently on antibiotic.  No leukocytosis, hemoglobin decreased to 8.9 but all cell line  decreased, likely some dilutional effect. UA done this morning with leukocytosis, microscopic hematuria and bacteria, cultures pending now.  Patient with progressive decline, history of end-stage multiple myeloma and has been off from chemotherapy for the past couple of month.  Palliative care from cancer center and his oncologist has already suggested hospice but apparently patient declined at that time.  Per daughter he was just thinking that hospice mean only 2 to 3 days of life.  Poor p.o. intake and recurrent UTI, unable to explain any urinary symptoms.  Increased lethargy can be due to sedating medications.  Overall patient is high risk for mortality and is hospice appropriate based on advanced age and underlying life limiting comorbidities.  Palliative care was consulted after discussing with daughter who is his POA .  11/17: Vitals stable, pending urine culture.  Appetite remained poor.   Assessment and Plan: * AMS (altered mental status) More alert and talking appropriately today. Likely multifactorial with overall decline.  May be recurrent UTI.  Patient was also on multiple sedating medications which are likely the cause of more sedation and altered mental status.  Doses were adjusted and backing off on some of the pain medications on admission. Overall failure to thrive with advanced malignancy and recurrent hospitalization, not a candidate for further treatment per oncology and they have already advised hospice. -Palliative care was also consulted   UTI (urinary tract infection) Patient with history of multiple UTI, prior urine culture from 12/11/2022 which showed Morganella MDR, sensitive to cefepime, imipenem.  Unable to explain any urinary symptoms but no leukocytosis or fever. History of multiple different antibiotics use. UA and urine culture were  ordered on admission but unfortunately never done until this morning, patient was already on cefepime. Might be just  colonization. -Continue with cefepime for now -Follow-up culture results  Multiple myeloma without remission [facility]) Oncology has stopped treatment over the past few month due to overall decline, poor response and they advise for hospice which he initially declined. Overall progressive decline. -Palliative care was consulted -Continue with pain management-doses were decreased to see if that will affect his mentation  Failure to thrive in adult Poor p.o. intake and progressive decline which is consistent with failure to thrive over the past couple of month. -Palliative care consult  Chronic atrial fibrillation with RVR (HCC) Patient was in A-fib with RVR, fluctuating heart rate which increases with some activity. -Increasing the dose of metoprolol to 25 mg twice daily -Continue with Eliquis  BPH (benign prostatic hyperplasia) Family requested no cath for urine as patient sustained severe ureteral damage on the recent Foley insertion in summer this year. -Continue Flomax  Neuropathy (HCC) -Continue Neurontin-dose was decreased from 400 to 300 mg during current hospitalization.     Subjective: Patient was more awake and alert when seen today.  Having some backache.  Physical Exam: Vitals:   12/20/22 1433 12/20/22 2030 12/21/22 0335 12/21/22 0805  BP: (!) 110/56 110/60 101/63 (!) 111/54  Pulse: 98 (!) 103 86 86  Resp: 16 20 16 15   Temp: (!) 97.4 F (36.3 C) 99.5 F (37.5 C) 97.7 F (36.5 C) 98.9 F (37.2 C)  TempSrc: Oral Oral Oral   SpO2: 98% 95% 94% 97%  Weight:       General.  Frail elderly man, in no acute distress. Pulmonary.  Lungs clear bilaterally, normal respiratory effort. CV.  Regular rate and rhythm, no JVD, rub or murmur. Abdomen.  Soft, nontender, nondistended, BS positive. CNS.  Alert and oriented .  No focal neurologic deficit. Extremities.  No edema, no cyanosis, pulses intact and symmetrical.   Data Reviewed: Prior data reviewed  Family  Communication:   Disposition: Status is: Inpatient Remains inpatient appropriate because: Severity of illness  Planned Discharge Destination:  To be determined, appropriate for hospice,  DVT prophylaxis.  Eliquis Time spent: 45 minutes  This record has been created using Conservation officer, historic buildings. Errors have been sought and corrected,but may not always be located. Such creation errors do not reflect on the standard of care.   Author: Arnetha Courser, MD 12/21/2022 2:37 PM  For on call review [email or web address].

## 2022-12-21 NOTE — Consult Note (Signed)
Consultation Note Date: 12/21/2022   Patient Name: Dean [name].  DOB: April 30, 1935  MRN: [id number]  Age / Sex: 87 y.o., male  PCP: Donita Brooks, MD Referring Physician: Arnetha Courser, MD  Reason for Consultation: Establishing goals of care   HPI/Brief Hospital Course: 87 y.o. male  with past medical history of multiple myeloma with disease progression, HTN, BPH, PAF on Eliquis admitted from [facility] on 12/19/2022 with AMS and possible UTI-was being treated for UTI and PNA at facility and had failed treatment. Per daughters reports, in facility provider was not comfortable leaving Dean [name] at facility over weekend without provider coverage prompting ED evaluation.  Noted 4 hospitalizations in last 6 months, most recently admitted 10/20-10/24 for aspiration pneumonitis and acute hypoxic respiratory failure  He is followed by oncology and palliative care-last visit 11/4 with complaints of uncontrolled pain and ongoing poor functional status. At that time it remained unclear if or when he would be a candidate to continue treatment.  Palliative medicine was consulted for assisting with goals of care conversations.  Subjective:  Extensive chart review has been completed prior to meeting patient including labs, vital signs, imaging, progress notes, orders, and available advanced directive documents from current and previous encounters.  Visited with Dean [name] at his bedside. He is resting in his bed but acknowledged my presence in room. He is able to share he is in the hospital but unclear of situation. He recalls having a recent visit with oncology but unsure of outcome of visit. He requests I speak with his daughters. No family at bedside during initial visit.  Returned to bedside with daughter-Dean [name] visiting.  Introduced myself as a Publishing rights manager as a member of the palliative care team. Explained palliative medicine is specialized  medical care for people living with serious illness. It focuses on providing relief from the symptoms and stress of a serious illness. The goal is to improve quality of life for both the patient and the family.   Dean [name] shares she is familiar with role of palliative as she accompanies Dean [name] to all of his appointments. Dean [name] shares prior to last admission with discharge to [facility], Dean [name] was living with her as he has not been able to live independently for quite some time. She shares with the recent acute and recurrent infections she has noted a significant functional decline.  Today, Dean [name] feels Dean [name] has greatly improved since yesterday. During our visit she is able to assist Dean [name] with eating his meal.  Dean [name] discusses she has a sister-Dean [name] who is the primary caretaker of their mother who has advanced dementia. Their mother and Dean [name] divorced many years ago. Dean [name] had a long term partner Dean [name] that recently also developed dementia and had to move away to live with her son. Dean [name] provides me with an updated document that appoints her as POA.  Dean [name] discusses recent visit with oncology and palliative care. She is aware due to Dean [name]'s functional decline he has not been able to receive his treatments. She also discusses that hospice has been discussed and recommended. She shares when that was discussed, Dean [name] made it clear he was not interested and wanting to continue pursuing aggressive treatment. Dean [name] shares Dean [name] has had 2 close friends that went under hospice services and they unfortunately passed away in a few days which Dean [name] relates the word hospice to this understanding. Dean [name] shares providers in the past have attempted to explain the  role and services of hospice but again Dean [name] has not been interested.  Dean [name] shares at this time Dean [name] and herself wish to continue with current plan of care. They are interested in  continuing to pursue aggressive interventions. Dean [name] is hopeful Dean [name] can return to rehab to regain strength and mobility and eventually return home and resume treatments.  I discussed importance of continued conversations with family/support persons and all members of their medical team regarding overall plan of care and treatment options ensuring decisions are in alignment with patients goals of care.  All questions/concerns addressed. Emotional support provided to patient/family/support persons. PMT will continue to follow and support patient as needed.  Objective: Primary Diagnoses: Present on Admission:  AMS (altered mental status)  BPH (benign prostatic hyperplasia)  Multiple myeloma without remission (HCC)   Physical Exam Constitutional:      General: He is not in acute distress.    Appearance: He is ill-appearing.  Pulmonary:     Effort: Pulmonary effort is normal. No respiratory distress.  Skin:    General: Skin is warm and dry.  Neurological:     Mental Status: He is alert. He is disoriented.     Motor: Weakness present.     Vital Signs: BP (!) 111/54 (BP Location: Left Arm)   Pulse 86   Temp 98.9 F (37.2 C)   Resp 15   Wt 86.6 kg   SpO2 97%   BMI 29.04 kg/m  Pain Scale: 0-10 POSS *See Group Information*: 1-Acceptable,Awake and alert Pain Score: 0-No pain   IO: Intake/output summary:  Intake/Output Summary (Last 24 hours) at 12/21/2022 1542 Last data filed at 12/21/2022 1143 Gross per 24 hour  Intake 740 ml  Output 200 ml  Net 540 ml    LBM: Last BM Date : 12/19/22 Baseline Weight: Weight: 86.6 kg Most recent weight: Weight: 86.6 kg      Assessment and Plan  SUMMARY OF RECOMMENDATIONS   Full Code-Full Scope PMT to continue to follow for ongoing needs and support  Palliative Prophylaxis:   Bowel Regimen, Delirium Protocol and Frequent Pain Assessment  Thank you for this consult and allowing Palliative Medicine to participate in the care  of Dean [name]. Alveria Apley. Palliative medicine will continue to follow and assist as needed.   Time Total: 75 minutes  Time spent includes: Detailed review of medical records (labs, imaging, vital signs), medically appropriate exam (mental status, respiratory, cardiac, skin), discussed with treatment team, counseling and educating patient, family and staff, documenting clinical information, medication management and coordination of care.   Signed by: Leeanne Deed, DNP, AGNP-C Palliative Medicine    Please contact Palliative Medicine Team phone at [phone] for questions and concerns.  For individual provider: See Loretha Stapler

## 2022-12-22 DIAGNOSIS — A419 Sepsis, unspecified organism: Secondary | ICD-10-CM

## 2022-12-22 NOTE — Consult Note (Signed)
WOC follow up from Friday, requested images of "multiple pressure injures" updated wound care, patient has end stage cancer dx.  I have ordered palliative topical care. Air mattress requested on Friday as well.    Re consult if needed, will not follow at this time. Thanks  Airyana Sprunger [employer], RN,CWOCN, CNS, CWON-AP [phone])

## 2022-12-22 NOTE — Progress Notes (Signed)
Progress Note   Patient: Dean [name]. JXB:[id number] DOB: 1935/10/16 DOA: 12/19/2022     2 DOS: the patient was seen and examined on 12/22/2022    Brief hospital course: Taken from H&P.   Dean [name]. is a 87 y.o. male with medical history significant of multiple myeloma end-stage, HTN, BPH, PAF on Eliquis, PE on Eliquis, who was just recently discharged from hospital to nursing home, sent back from nursing home today for evaluation of worsening of confusion and possible UTI failed outpatient management.    Patient was hospitalized 3 weeks ago for treatment of hypoxia and aspiration pneumonia. Discharged to nursing home and patient continued on p.o. antibiotics for the pneumonia for another 7 days. Initially patient recovered well but about 1 week ago patient started develop lethargy, generalized weakness and strong smell of urine and rehab center started him on IV antibiotic and today will be the last day. Despite, patient continues to have daytime sleepiness, generalized weakness and significant decreased oral intake for 7 days. No fever as per daughter.  No obvious urinary symptoms.   On presentation patient was febrile at 100, HR 120, EKG A-fib with RVR, softer blood pressure at 97/73, lactic acid 2.8, no leukocytosis. Also found to have pressure injury, stage II on sacral and a shallow ulcer on left lateral ankle.   Patient was given cefepime, vancomycin and Flagyl in ED. continued on cefepime   Per SNF record, urine cultures from 12/11/2022 showed  Morganella MDR, sensitive to cefepime, imipenem.  Patient was also on multiple sedating medications which were cut back.   11/16: Afebrile, heart rate in low 100s, lactic acidosis resolved, troponin remain negative, preliminary blood cultures negative in 24-hour, UA and urine cultures were ordered but unfortunately never sent on admission and patient is currently on antibiotic.  No leukocytosis, hemoglobin decreased to 8.9 but all cell  line decreased, likely some dilutional effect. UA done this morning with leukocytosis, microscopic hematuria and bacteria, cultures pending now.   Patient with progressive decline, history of end-stage multiple myeloma and has been off from chemotherapy for the past couple of month.  Palliative care from cancer center and his oncologist has already suggested hospice but apparently patient declined at that time.  Per daughter he was just thinking that hospice mean only 2 to 3 days of life.  Poor p.o. intake and recurrent UTI, unable to explain any urinary symptoms.  Increased lethargy can be due to sedating medications.  Overall patient is high risk for mortality and is hospice appropriate based on advanced age and underlying life limiting comorbidities.  Palliative care was consulted after discussing with daughter who is his POA .   11/17: Vitals stable, pending urine culture.  Appetite remained poor.     Assessment and Plan: Acute metabolic encephalopathy likely secondary to UTI -Palliative care was also consulted Continue current antibiotics   UTI (urinary tract infection) Patient with history of multiple UTI, prior urine culture from 12/11/2022 which showed Morganella MDR, sensitive to cefepime, imipenem.  Unable to explain any urinary symptoms but no leukocytosis or fever. History of multiple different antibiotics use. UA and urine culture were ordered on admission but unfortunately never done until this morning, patient was already on cefepime. Might be just colonization. Continue cefepime Follow-up on urine culture results   Multiple myeloma without remission [facility]) Oncology has stopped treatment over the past few month due to overall decline, poor response and they advise for hospice which he initially declined. Overall progressive  decline. -Palliative care was consulted Continue with pain management-doses were decreased to see if that will affect his mentation   Failure to thrive in  adult Poor p.o. intake and progressive decline which is consistent with failure to thrive over the past couple of month. Follow-up on palliative care recommendation   Chronic atrial fibrillation with RVR (HCC) Patient was in A-fib with RVR, fluctuating heart rate which increases with some activity. -Increasing the dose of metoprolol to 25 mg twice daily -Continue with Eliquis   BPH (benign prostatic hyperplasia) Family requested no cath for urine as patient sustained severe ureteral damage on the recent Foley insertion in summer this year. Continue Flomax   Neuropathy (HCC) Continue Neurontin-dose was decreased from 400 to 300 mg during current hospitalization.       Subjective:  Patient denies any abdominal pain nausea vomiting chest pain or cough Urine culture still pending   Physical Exam: General.  Frail elderly man, in no acute distress. Pulmonary.  Lungs clear bilaterally, normal respiratory effort. CV.  Regular rate and rhythm, no JVD, rub or murmur. Abdomen.  Soft, nontender, nondistended, BS positive. CNS.  Alert and oriented .  No focal neurologic deficit. Extremities.  No edema, no cyanosis, pulses intact and symmetrical.      Family Communication:    Disposition: Status is: Inpatient Remains inpatient appropriate because: Severity of illness  Planned Discharge Destination:  To be determined, appropriate for hospice,   DVT prophylaxis.  Eliquis  Time spent: 41 minutes   Data Reviewed:    Latest Ref Rng & Units 12/20/2022    3:25 AM 12/19/2022   11:50 AM 11/26/2022    4:47 AM  BMP  Glucose 70 - 99 mg/dL 604  97  540   BUN 8 - 23 mg/dL 21  17  21    Creatinine 0.61 - 1.24 mg/dL 9.81  1.91  4.78   Sodium 135 - 145 mmol/L 140  138  140   Potassium 3.5 - 5.1 mmol/L 3.0  3.5  3.8   Chloride 98 - 111 mmol/L 106  103  107   CO2 22 - 32 mmol/L 28  26  28    Calcium 8.9 - 10.3 mg/dL 9.1  8.7  8.2        Latest Ref Rng & Units 12/20/2022    3:25 AM  12/19/2022   11:50 AM 11/25/2022    6:53 AM  CBC  WBC 4.0 - 10.5 K/uL 5.2  6.8  4.9   Hemoglobin 13.0 - 17.0 g/dL 8.9  29.5  62.1   Hematocrit 39.0 - 52.0 % 29.3  35.0  33.2   Platelets 150 - 400 K/uL 146  197  143      Vitals:   12/21/22 1956 12/22/22 0039 12/22/22 0427 12/22/22 0818  BP: (!) 109/59 (!) 94/51 124/61 (!) 119/53  Pulse: (!) 105 94 73 92  Resp: 18 18 18 16   Temp: 98.7 F (37.1 C)  (!) 97.5 F (36.4 C) (!) 97.5 F (36.4 C)  TempSrc:    Oral  SpO2: 97% 94% 95% 96%  Weight:         Author: Loyce Dys, MD 12/22/2022 3:28 PM  For on call review [email or web address].

## 2022-12-22 NOTE — TOC Initial Note (Signed)
Transition of Care (TOC) - Initial/Assessment Note    Patient Details  Name: Dean [name]. MRN: [id number] Date of Birth: May 31, 1935  Transition of Care [facility]) CM/SW Contact:    Chapman Fitch, RN Phone Number: 12/22/2022, 12:38 PM  Clinical Narrative:                  Met with daughter Dean [name] at bedside Patient admitted from [facility] for STR Per Tiffany patient has used 22 days, and did not do a bed hold.   Daughter confirms her wishes is for patient to return  to [facility] to continue therapy, but is aware that if [facility] does not have a bed, will have to persue another facility.    Fl2 sent for signature Bed search initiated        Patient Goals and CMS Choice            Expected Discharge Plan and Services                                              Prior Living Arrangements/Services                       Activities of Daily Living   ADL Screening (condition at time of admission) Independently performs ADLs?: No Does the patient have a NEW difficulty with bathing/dressing/toileting/self-feeding that is expected to last >3 days?: No Does the patient have a NEW difficulty with getting in/out of bed, walking, or climbing stairs that is expected to last >3 days?: No Does the patient have a NEW difficulty with communication that is expected to last >3 days?: No Is the patient deaf or have difficulty hearing?: Yes Does the patient have difficulty seeing, even when wearing glasses/contacts?: Yes Does the patient have difficulty concentrating, remembering, or making decisions?: Yes  Permission Sought/Granted                  Emotional Assessment              Admission diagnosis:  AMS (altered mental status) [R41.82] Sepsis, due to unspecified organism, unspecified whether acute organ dysfunction present [facility]) [A41.9] Patient Active Problem List   Diagnosis Date Noted   Failure to thrive in adult  12/20/2022   Chronic atrial fibrillation with RVR (HCC) 12/20/2022   UTI (urinary tract infection) 12/19/2022   AMS (altered mental status) 12/19/2022   Hypokalemia 11/25/2022   Malnutrition of moderate degree 11/25/2022   Palliative care encounter 11/25/2022   Hyponatremia 11/24/2022   Aspiration pneumonitis (HCC) 11/24/2022   Acute on chronic diastolic CHF (congestive heart failure) (HCC) 11/24/2022   Effusion of joint of right shoulder 11/24/2022   Acute hypoxemic respiratory failure (HCC) 11/23/2022   Mass of skin of right shoulder 11/23/2022   Chest pain 11/23/2022   Bacteremia due to Gram-negative bacteria 10/21/2022   Cellulitis of right arm 10/21/2022   Sepsis due to cellulitis (HCC) 10/17/2022   Leukopenia 10/17/2022   Paroxysmal A-fib (HCC) 10/17/2022   Sepsis (HCC) 10/17/2022   Acute hypoxic respiratory failure (HCC) 08/19/2022   PNA (pneumonia) 08/19/2022   Urethral bleeding 08/19/2022   Uncomplicated opioid dependence (HCC) 02/13/2022   SVT (supraventricular tachycardia) (HCC) 02/12/2022   Pulmonary embolism (HCC) 02/10/2022   Acute metabolic encephalopathy 02/10/2022   Pain in left arm 10/16/2021   Closed fracture  of left clavicle 08/19/2021   Spinal stenosis, lumbar 10/13/2016   Chronic back pain 10/01/2016   DDD (degenerative disc disease), lumbar 10/01/2016   OA (osteoarthritis) of knee 10/01/2016   Diverticulitis s/p robotic sigmoid colectomy 07/11/2015 07/11/2015   Goals of care, counseling/discussion    Abnormal CT scan, sigmoid colon 04/02/2015   Abnormal PET scan of colon 04/02/2015   Thrombocytopenia (HCC) 01/09/2015   Multiple myeloma without remission (HCC) 02/15/2014   Allergic rhinitis 01/16/2014   History of vertebral stress fracture 01/16/2014   Dermatitis 01/16/2014   Loss of weight 09/05/2013   Insomnia 09/05/2013   Weakness 05/16/2013   Neck pain 12/07/2012   Skull lesion 11/29/2012   Neuropathy (HCC)    BPH (benign prostatic hyperplasia)     GERD (gastroesophageal reflux disease)    PCP:  Donita Brooks, MD Pharmacy:   [facility] - [place], [place] - [address] [place] [address] Phone: [phone] Fax: [phone]  [facility] [place] - [address] [address] [place] [address] Phone: [phone] Fax: [phone]     Social Determinants of Health (SDOH) Social History: SDOH Screenings   Food Insecurity: No Food Insecurity (12/19/2022)  Housing: Low Risk  (12/19/2022)  Transportation Needs: No Transportation Needs (12/19/2022)  Utilities: Not At Risk (12/19/2022)  Alcohol Screen: Low Risk  (01/09/2022)  Depression (PHQ2-9): Low Risk  (09/16/2022)  Financial Resource Strain: Low Risk  (03/21/2022)  Physical Activity: Inactive (01/09/2022)  Social Connections: Moderately Isolated (01/09/2022)  Stress: Stress Concern Present (01/09/2022)  Tobacco Use: Low Risk  (12/19/2022)   SDOH Interventions:     Readmission Risk Interventions    11/24/2022    4:10 PM 08/20/2022    1:21 PM  Readmission Risk Prevention Plan  Transportation Screening Complete Complete  PCP or Specialist Appt within 3-5 Days  Complete  HRI or Home Care Consult  Complete  Social Work Consult for Recovery Care Planning/Counseling  Complete  Palliative Care Screening  Not Applicable  Medication Review Oceanographer) Complete Complete  HRI or Home Care Consult Complete   SW Recovery Care/Counseling Consult Complete   Palliative Care Screening Not Applicable   Skilled Nursing Facility Not Applicable

## 2022-12-22 NOTE — [place] FL2 (Signed)
[place] MEDICAID FL2 LEVEL OF CARE FORM     IDENTIFICATION  Patient Name: Dean [name]. Birthdate: [birth year] Sex: male Admission Date (Current Location): 12/19/2022  [facility] and [place] Number:  Chiropodist and Address:         Provider Number: [id number]  Attending Physician Name and Address:  Loyce Dys, MD  Relative Name and Phone Number:       Current Level of Care: Hospital Recommended Level of Care: Skilled Nursing Facility Prior Approval Number:    Date Approved/Denied:   PASRR Number:    Discharge Plan: SNF    Current Diagnoses: Patient Active Problem List   Diagnosis Date Noted   Failure to thrive in adult 12/20/2022   Chronic atrial fibrillation with RVR (HCC) 12/20/2022   UTI (urinary tract infection) 12/19/2022   AMS (altered mental status) 12/19/2022   Hypokalemia 11/25/2022   Malnutrition of moderate degree 11/25/2022   Palliative care encounter 11/25/2022   Hyponatremia 11/24/2022   Aspiration pneumonitis (HCC) 11/24/2022   Acute on chronic diastolic CHF (congestive heart failure) (HCC) 11/24/2022   Effusion of joint of right shoulder 11/24/2022   Acute hypoxemic respiratory failure (HCC) 11/23/2022   Mass of skin of right shoulder 11/23/2022   Chest pain 11/23/2022   Bacteremia due to Gram-negative bacteria 10/21/2022   Cellulitis of right arm 10/21/2022   Sepsis due to cellulitis (HCC) 10/17/2022   Leukopenia 10/17/2022   Paroxysmal A-fib (HCC) 10/17/2022   Sepsis (HCC) 10/17/2022   Acute hypoxic respiratory failure (HCC) 08/19/2022   PNA (pneumonia) 08/19/2022   Urethral bleeding 08/19/2022   Uncomplicated opioid dependence (HCC) 02/13/2022   SVT (supraventricular tachycardia) (HCC) 02/12/2022   Pulmonary embolism (HCC) 02/10/2022   Acute metabolic encephalopathy 02/10/2022   Pain in left arm 10/16/2021   Closed fracture of left clavicle 08/19/2021   Spinal stenosis, lumbar 10/13/2016   Chronic back pain  10/01/2016   DDD (degenerative disc disease), lumbar 10/01/2016   OA (osteoarthritis) of knee 10/01/2016   Diverticulitis s/p robotic sigmoid colectomy 07/11/2015 07/11/2015   Goals of care, counseling/discussion    Abnormal CT scan, sigmoid colon 04/02/2015   Abnormal PET scan of colon 04/02/2015   Thrombocytopenia (HCC) 01/09/2015   Multiple myeloma without remission (HCC) 02/15/2014   Allergic rhinitis 01/16/2014   History of vertebral stress fracture 01/16/2014   Dermatitis 01/16/2014   Loss of weight 09/05/2013   Insomnia 09/05/2013   Weakness 05/16/2013   Neck pain 12/07/2012   Skull lesion 11/29/2012   Neuropathy (HCC)    BPH (benign prostatic hyperplasia)    GERD (gastroesophageal reflux disease)     Orientation RESPIRATION BLADDER Height & Weight     Self  Normal Incontinent Weight: 86.6 kg Height:     BEHAVIORAL SYMPTOMS/MOOD NEUROLOGICAL BOWEL NUTRITION STATUS      Continent Diet (regular)  AMBULATORY STATUS COMMUNICATION OF NEEDS Skin   Total Care Verbally PU Stage and Appropriate Care, Bruising                       Personal Care Assistance Level of Assistance              Functional Limitations Info             SPECIAL CARE FACTORS FREQUENCY  PT (By licensed PT), OT (By licensed OT)                    Contractures Contractures Info: Not present  Additional Factors Info  Code Status, Allergies Code Status Info: Full Allergies Info: Codeine, Morphine And Codeine, Revlimid (Lenalidomide)           Current Medications (12/22/2022):  This is the current hospital active medication list Current Facility-Administered Medications  Medication Dose Route Frequency Provider Last Rate Last Admin   acetaminophen (TYLENOL) tablet 500-1,000 mg  500-1,000 mg Oral Q6H PRN Mikey College T, MD   1,000 mg at 12/21/22 2118   ALPRAZolam Prudy Feeler) tablet 0.25 mg  0.25 mg Oral QHS Mikey College T, MD   0.25 mg at 12/21/22 2117   apixaban (ELIQUIS) tablet 5  mg  5 mg Oral BID Mikey College T, MD   5 mg at 12/22/22 1000   ceFEPIme (MAXIPIME) 2 g in sodium chloride 0.9 % 100 mL IVPB  2 g Intravenous Q8H Orson Aloe, RPH 200 mL/hr at 12/22/22 0423 2 g at 12/22/22 0423   feeding supplement (ENSURE ENLIVE / ENSURE PLUS) liquid 237 mL  237 mL Oral TID BM Mikey College T, MD   237 mL at 12/22/22 0959   ferrous sulfate tablet 325 mg  325 mg Oral Daily Mikey College T, MD   325 mg at 12/22/22 1000   gabapentin (NEURONTIN) capsule 300 mg  300 mg Oral BID Mikey College T, MD   300 mg at 12/22/22 1000   guaiFENesin-dextromethorphan (ROBITUSSIN DM) 100-10 MG/5ML syrup 5 mL  5 mL Oral Q4H PRN Mikey College T, MD       melatonin tablet 10 mg  10 mg Oral QHS Mikey College T, MD   10 mg at 12/21/22 2118   metoprolol tartrate (LOPRESSOR) injection 2.5 mg  2.5 mg Intravenous Q4H PRN Mikey College T, MD   2.5 mg at 12/19/22 1802   metoprolol tartrate (LOPRESSOR) tablet 25 mg  25 mg Oral BID Arnetha Courser, MD   25 mg at 12/22/22 1000   montelukast (SINGULAIR) tablet 10 mg  10 mg Oral QHS Mikey College T, MD   10 mg at 12/21/22 2118   multivitamin with minerals tablet 1 tablet  1 tablet Oral Daily Arnetha Courser, MD   1 tablet at 12/22/22 1000   nutrition supplement (JUVEN) (JUVEN) powder packet 1 packet  1 packet Oral BID BM Arnetha Courser, MD   1 packet at 12/22/22 1000   ondansetron (ZOFRAN) tablet 4 mg  4 mg Oral Q6H PRN Mikey College T, MD       Or   ondansetron [facility]) injection 4 mg  4 mg Intravenous Q6H PRN Mikey College T, MD       oxyCODONE (Oxy IR/ROXICODONE) immediate release tablet 10 mg  10 mg Oral Q6H PRN Mikey College T, MD   10 mg at 12/20/22 4098   tamsulosin (FLOMAX) capsule 0.4 mg  0.4 mg Oral QPC supper Mikey College T, MD   0.4 mg at 12/21/22 1752   traZODone (DESYREL) tablet 100 mg  100 mg Oral QHS Mikey College T, MD   100 mg at 12/21/22 2117   Facility-Administered Medications Ordered in Other Encounters  Medication Dose Route Frequency Provider Last Rate Last Admin    heparin lock flush 100 unit/mL  500 Units Intravenous Once Jeralyn Ruths, MD         Discharge Medications: Please see discharge summary for a list of discharge medications.  Relevant Imaging Results:  Relevant Lab Results:   Additional Information SSN [id number]  Chapman Fitch, RN

## 2022-12-23 DIAGNOSIS — Z515 Encounter for palliative care: Secondary | ICD-10-CM

## 2022-12-23 DIAGNOSIS — A419 Sepsis, unspecified organism: Secondary | ICD-10-CM | POA: Diagnosis not present

## 2022-12-23 LAB — BASIC METABOLIC PANEL
Anion gap: 6 (ref 5–15)
BUN: 28 mg/dL — ABNORMAL HIGH (ref 8–23)
CO2: 26 mmol/L (ref 22–32)
Calcium: 8.8 mg/dL — ABNORMAL LOW (ref 8.9–10.3)
Chloride: 107 mmol/L (ref 98–111)
Creatinine, Ser: 0.58 mg/dL — ABNORMAL LOW (ref 0.61–1.24)
GFR, Estimated: >60 mL/min (ref >60)
Glucose, Bld: 97 mg/dL (ref 70–99)
Potassium: 3.3 mmol/L — ABNORMAL LOW (ref 3.5–5.1)
Sodium: 139 mmol/L (ref 135–145)

## 2022-12-23 LAB — CBC WITH DIFFERENTIAL/PLATELET
Abs Immature Granulocytes: 0.06 10*3/uL (ref 0.00–0.07)
Basophils Absolute: 0 10*3/uL (ref 0.0–0.1)
Basophils Relative: 1 %
Eosinophils Absolute: 0.1 10*3/uL (ref 0.0–0.5)
Eosinophils Relative: 1 %
HCT: 29 % — ABNORMAL LOW (ref 39.0–52.0)
Hemoglobin: 9.1 g/dL — ABNORMAL LOW (ref 13.0–17.0)
Immature Granulocytes: 1 %
Lymphocytes Relative: 29 %
Lymphs Abs: 1.6 10*3/uL (ref 0.7–4.0)
MCH: 29.8 pg (ref 26.0–34.0)
MCHC: 31.4 g/dL (ref 30.0–36.0)
MCV: 95.1 fL (ref 80.0–100.0)
Monocytes Absolute: 0.6 10*3/uL (ref 0.1–1.0)
Monocytes Relative: 11 %
Neutro Abs: 3.2 10*3/uL (ref 1.7–7.7)
Neutrophils Relative %: 57 %
Platelets: 178 10*3/uL (ref 150–400)
RBC: 3.05 MIL/uL — ABNORMAL LOW (ref 4.22–5.81)
RDW: 15.7 % — ABNORMAL HIGH (ref 11.5–15.5)
WBC: 5.5 10*3/uL (ref 4.0–10.5)
nRBC: 0 % (ref 0.0–0.2)

## 2022-12-23 MED ORDER — DOCUSATE SODIUM 100 MG PO CAPS
100.0000 mg | ORAL_CAPSULE | Freq: Two times a day (BID) | ORAL | Status: DC
  Administered 2022-12-23 – 2022-12-24 (×3): 100 mg via ORAL
  Filled 2022-12-23 (×3): qty 1

## 2022-12-23 MED ORDER — POTASSIUM CHLORIDE CRYS ER 20 MEQ PO TBCR
40.0000 meq | EXTENDED_RELEASE_TABLET | ORAL | Status: AC
  Administered 2022-12-23 (×2): 40 meq via ORAL
  Filled 2022-12-23 (×2): qty 2

## 2022-12-23 MED ORDER — FLUCONAZOLE 100 MG PO TABS
200.0000 mg | ORAL_TABLET | Freq: Every day | ORAL | Status: DC
  Administered 2022-12-23 – 2022-12-24 (×2): 200 mg via ORAL
  Filled 2022-12-23 (×2): qty 2

## 2022-12-23 MED ORDER — AMOXICILLIN 500 MG PO CAPS
500.0000 mg | ORAL_CAPSULE | Freq: Three times a day (TID) | ORAL | Status: DC
  Administered 2022-12-23 – 2022-12-24 (×4): 500 mg via ORAL
  Filled 2022-12-23 (×5): qty 1

## 2022-12-23 NOTE — TOC Progression Note (Addendum)
Transition of Care (TOC) - Progression Note    Patient Details  Name: Dean [name]. MRN: [id number] Date of Birth: [birth year]  Transition of Care [facility]) CM/SW Contact  Chapman Fitch, RN Phone Number: 12/23/2022, 9:41 AM  Clinical Narrative:      Message sent to MD to determine when it is anticipated patient will medically be ready for discharge   1113 am - Per MD anticipated patient will medically be ready for discharge tomorrow  Tiffany at [facility] confirms they will not have a bed at discharge.  Notified daughter Dean [name] and presented bed offers.  Dean [name] accepts bed at [facility].  Accepted in HUB, and notified Tanya at [facility]  DNR on the chart for MD to sign       Expected Discharge Plan and Services                                               Social Determinants of Health (SDOH) Interventions SDOH Screenings   Food Insecurity: No Food Insecurity (12/19/2022)  Housing: Low Risk  (12/19/2022)  Transportation Needs: No Transportation Needs (12/19/2022)  Utilities: Not At Risk (12/19/2022)  Alcohol Screen: Low Risk  (01/09/2022)  Depression (PHQ2-9): Low Risk  (09/16/2022)  Financial Resource Strain: Low Risk  (03/21/2022)  Physical Activity: Inactive (01/09/2022)  Social Connections: Moderately Isolated (01/09/2022)  Stress: Stress Concern Present (01/09/2022)  Tobacco Use: Low Risk  (12/19/2022)    Readmission Risk Interventions    11/24/2022    4:10 PM 08/20/2022    1:21 PM  Readmission Risk Prevention Plan  Transportation Screening Complete Complete  PCP or Specialist Appt within 3-5 Days  Complete  HRI or Home Care Consult  Complete  Social Work Consult for Recovery Care Planning/Counseling  Complete  Palliative Care Screening  Not Applicable  Medication Review Oceanographer) Complete Complete  HRI or Home Care Consult Complete   SW Recovery Care/Counseling Consult Complete   Palliative Care  Screening Not Applicable   Skilled Nursing Facility Not Applicable

## 2022-12-23 NOTE — Progress Notes (Signed)
Physical Therapy Treatment Patient Details Name: Dean [name]. MRN: [id number] DOB: 10-02-1935 Today's Date: 12/23/2022   History of Present Illness Dean [name]. is a 87 y.o. male with medical history significant of multiple myeloma end-stage, HTN, BPH, PAF on Eliquis, PE on Eliquis, who was just recently discharged from hospital to nursing home, sent back from nursing home today for evaluation of worsening of confusion and possible UTI failed outpatient management.    PT Comments  Pt seen for PT tx with pt agreeable. Pt with c/o buttocks pain, PT assisted pt with repositioning attempts & nurse made aware. Pt is able to complete bed mobility with max assist with use of hospital bed features, STS from significantly elevated EOB with use of stedy with min assist, but unable to transfer STS from lower recliner with only 1 assist. Pt performs BLE strengthening exercises with cuing for technique. PT attempts to assist pt with repositioning/midline orientation when sitting in recliner with pt noting feeling a little less pain at end of session. Will continue to follow pt acutely to progress mobility as able.    If plan is discharge home, recommend the following: Two people to help with walking and/or transfers;Two people to help with bathing/dressing/bathroom;Help with stairs or ramp for entrance;Supervision due to cognitive status;Assistance with cooking/housework;Assist for transportation   Can travel by private vehicle     No  Equipment Recommendations  Hospital bed;Hoyer lift;Wheelchair (measurements PT);Wheelchair cushion (measurements PT);BSC/3in1    Recommendations for Other Services       Precautions / Restrictions Precautions Precautions: Fall Restrictions Weight Bearing Restrictions: No     Mobility  Bed Mobility Overal bed mobility: Needs Assistance Bed Mobility: Supine to Sit     Supine to sit: HOB elevated, Used rails, Max assist     General bed mobility  comments: Pt requires assistance to move BLE off EOB & upright trunk to sit EOB.    Transfers Overall transfer level: Needs assistance   Transfers: Sit to/from Stand, Bed to chair/wheelchair/BSC Sit to Stand: Via lift equipment           General transfer comment: Pt transfers STS from significantly elevated EOB with BUE on stedy with min assist +2 with pt able to clear buttocks but ongoing forward trunk flexion as pt unable to hold trunk upright. Pt transferred bed>recliner with stedy assist, unable to transfer STS from low recliner at end of session with only 1 assist, pt also fatigued. Transfer via Lift Equipment: Stedy  Ambulation/Gait                   Stairs             Wheelchair Mobility     Tilt Bed    Modified Rankin (Stroke Patients Only)       Balance Overall balance assessment: Needs assistance Sitting-balance support: Bilateral upper extremity supported, Feet supported Sitting balance-Leahy Scale: Poor Sitting balance - Comments: L lateral lean, pt reports he's been doing this for a few days now, requires assistance for midline orientation.   Standing balance support: During functional activity, Bilateral upper extremity supported, Reliant on assistive device for balance Standing balance-Leahy Scale: Poor                              Cognition Arousal: Alert Behavior During Therapy: WFL for tasks assessed/performed Overall Cognitive Status: No family/caregiver present to determine baseline cognitive functioning  General Comments: Extra time to follow simple commands throughout session, increased processing time.        Exercises General Exercises - Lower Extremity Long Arc Quad: AROM, Seated, Strengthening, Both, 20 reps Heel Slides: AAROM, Strengthening, Both, 10 reps    General Comments General comments (skin integrity, edema, etc.): max HR 138 bpm during STS attempt, no c/o  symptoms      Pertinent Vitals/Pain Pain Assessment Pain Assessment: Faces Faces Pain Scale: Hurts little more Pain Location: buttocks Pain Descriptors / Indicators: Discomfort, Sore Pain Intervention(s): Monitored during session, Repositioned (notified nurse)    Home Living                          Prior Function            PT Goals (current goals can now be found in the care plan section) Acute Rehab PT Goals PT Goal Formulation: Patient unable to participate in goal setting Progress towards PT goals: Progressing toward goals    Frequency    Min 1X/week      PT Plan      Co-evaluation              AM-PAC PT "6 Clicks" Mobility   Outcome Measure  Help needed turning from your back to your side while in a flat bed without using bedrails?: A Lot Help needed moving from lying on your back to sitting on the side of a flat bed without using bedrails?: A Lot Help needed moving to and from a bed to a chair (including a wheelchair)?: Total Help needed standing up from a chair using your arms (e.g., wheelchair or bedside chair)?: Total Help needed to walk in hospital room?: Total Help needed climbing 3-5 steps with a railing? : Total 6 Click Score: 8    End of Session   Activity Tolerance: Patient limited by fatigue Patient left: in chair;with chair alarm set;with call bell/phone within reach Nurse Communication: Mobility status (c/o pain) PT Visit Diagnosis: Muscle weakness (generalized) (M62.81);Other abnormalities of gait and mobility (R26.89);Difficulty in walking, not elsewhere classified (R26.2);Unsteadiness on feet (R26.81)     Time: 1510-1533 PT Time Calculation (min) (ACUTE ONLY): 23 min  Charges:    $Therapeutic Activity: 23-37 mins PT General Charges $$ ACUTE PT VISIT: 1 Visit                     Aleda Grana, PT, DPT 12/23/22, 3:49 PM   Sandi Mariscal 12/23/2022, 3:48 PM

## 2022-12-23 NOTE — Progress Notes (Signed)
Palliative Medicine [facility] Telephone:([phone]) [phone] Fax:(336) [phone]   Name: Dean [name]. Date: 12/23/2022 MRN: [id number]  DOB: 07-11-1935  Patient Care Team: Donita Brooks, MD as PCP - General (Family Medicine) Antony Blackbird, MD as Consulting Physician (Radiation Oncology) West Bali, MD (Inactive) as Consulting Physician (Gastroenterology) Karie Soda, MD as Consulting Physician (General Surgery) Jeralyn Ruths, MD as Consulting Physician (Oncology)    REASON FOR CONSULTATION: Dean [name]. is a 87 y.o. male with multiple medical problems including multiple myeloma with disease progression most recently on treatment with Pomalyst.  Patient is felt to not have viable treatment alternatives and hospice has been discussed.  He has had multiple recent hospitalizations, now admitted on 12/19/2022 with UTI.  Palliative care was consulted to address goals. .   CODE STATUS: DNR  PAST MEDICAL HISTORY: Past Medical History:  Diagnosis Date   BPH (benign prostatic hyperplasia)    Colonic diverticular abscess 01/08/2015   Diverticulitis 12/22/2014   complicated by abscess and required percutaneous drainage   GERD (gastroesophageal reflux disease)    H/O ETOH abuse    History of chemotherapy last done jan 2017   History of radiation therapy 01/05/13-02/10/13   45 gray to left occipital condyle region   History of radiation therapy 12/01/16-12/10/16   Parasternal nodule, chest- 24 Gy total delivered in 8 fractions, Left sacro-iliac, pelvis- 24 Gy total delivered in 8 fractions    History of radiation therapy 02/19/17-02/22/17   right temporal scalp 30 Gy in 10 fractions   History of radiation therapy    Right hip( Pelvis) 11/18/21-11/21/21-Dr. Antony Blackbird   Intra-abdominal abscess (HCC)    Multiple myeloma (HCC) 2015   Neuropathy    Radiation 02/21/14-03/08/14   right posterior chest wall area 30 gray   Radiation  04/19/14-05/02/14   lumbar spine 25 gray   Skull lesion    Left occipital condyle   Wrist fracture, left    x 2    PAST SURGICAL HISTORY:  Past Surgical History:  Procedure Laterality Date   COLONOSCOPY N/A 04/19/2015   Procedure: COLONOSCOPY;  Surgeon: West Bali, MD;  [facility];  Service: Endoscopy;  Laterality: N/A;  1:30 PM   CYST EXCISION  1959   tail bone   CYSTOSCOPY WITH FULGERATION N/A 08/20/2022   Procedure: CYSTOSCOPY WITH FULGERATION;  Surgeon: Crista Elliot, MD;  Location: [facility];  Service: Urology;  Laterality: N/A;  cysto clot evac   IR IMAGING GUIDED PORT INSERTION  09/25/2017   IR REMOVAL TUN ACCESS W/ PORT W/O FL MOD SED  10/21/2022    HEMATOLOGY/ONCOLOGY HISTORY:  Oncology History  Multiple myeloma without remission (HCC)  11/16/2012 Initial Diagnosis   L occipital condyle destructive lesion, not biopsied secondary to location. XRT   12/01/2012 Imaging   L3 superior endplate compression FX, no lytic lesions to suggest myeloma   02/06/2014 Imaging   soft tissue mass along the right posterior fifth rib with some rib destruction   02/16/2014 Miscellaneous   Normal CBC, Normal CMP, kappa,lamda ratio of 2.62 (abnormal), UIEP with slightly restricted band, IgG kappa, SPEP/IEP with monoclonal protein at 0.79 g/dl, normal igG, suppressed IGM   02/20/2014 Bone Marrow Biopsy   33% kappa restricted plasma cells Normal FISH, normal cytogenetics   02/21/2014 - 03/08/2014 Radiation Therapy   30Gy to R rib lesion, lesion not biopsied   03/16/2014 PET scan   Scattered hypermetabolic osseous  lesions, including the calvarium, ribs, left scapula, sacrum, and left femoral shaft. An expansile left vertebral body lesion at L3 extends into the epidural space of the left lateral recess,    03/17/2014 - 04/07/2014 Chemotherapy   Velcade and Dexamethasone due to initial denial of Revlimid by insurance company.  Zometa monthly.   03/22/2014 Imaging   MR_ L-Spine-  Enhancing lesions compatible with multiple myeloma at L2, L3, and S1-2.   04/07/2014 - 07/28/2014 Chemotherapy   RVD with Revlimid at 25 mg days 1-14.  Revlimid dose reduced to 25 mg every other day x 14 days with 7 day respite beginning on day 1 of cycle 2.   04/17/2014 Adverse Reaction   Revlimid-induced rash.  Treated with steroids.   07/28/2014 - 08/04/2014 Chemotherapy   Revlimid daily   08/04/2014 Adverse Reaction   Velcade-induced peripheral neuropathy   08/04/2014 Treatment Plan Change   D/C Velcade   08/11/2014 PET scan   Response to therapy. Improvement and resolution of foci of osseous hypermetabolism.   08/22/2014 - 08/28/2014 Chemotherapy   Revlimid 25 mg days 1-21 every 28 days   08/28/2014 Adverse Reaction   Revlimid-induced rash. Medication held.  Medrol dose Pak prescribed.   09/04/2014 - 01/08/2015 Chemotherapy   Revlimid 10 mg every other day, without dexamethasone   01/08/2015 - 01/15/2015 Hospital Admission   Colonic diverticular abscess with IR drain placement   03/02/2015 PET scan   Only bony uptake is low activity at site of deformity/callus at R second rib FX, high activity in sigmoid colon, advanced sigmoid divertidulosis. Abscess not resolved?   06/04/2015 Imaging   CT nonobstructive L renal calculus, diverticulosis of descending and sigmoid colon without inflammation, moderate prostatic enlargement   07/12/2015 Surgery   Diverticulitis s/p robotic sigmoid colectomy with Dr. Michaell Cowing   08/23/2015 PET scan   Primarily similar hypermetabolic osseous foci within the R sided ribs. new L third rib focus of hypermetab and sclerosis is favored to be related to healing FX. no soft tissue myeloma id'd. Presumed postop hypermetab and edema about the R pelvic wall    03/06/2016 PET scan   1. Reduced activity in the prior lesion such as the right second and fifth rib lesions, activity nearly completely resolved and significantly less than added mediastinal blood pool activity. No new  lesions are identified. 2. Other imaging findings of potential clinical significance: Coronary, aortic arch, and branch vessel atherosclerotic vascular disease. Aortoiliac atherosclerotic vascular disease. Enlarged prostate gland. Colonic diverticula.   02/17/2017 PET scan   HEAD/NECK: No hypermetabolic activity in the scalp. No hypermetabolic cervical lymph nodes. CHEST: No hypermetabolic mediastinal or hilar nodes. Right upper lobe scarring/atelectasis. No suspicious pulmonary nodules on the CT scan. ABDOMEN/PELVIS: No abnormal hypermetabolic activity within the liver, pancreas, adrenal glands, or spleen. No hypermetabolic lymph nodes in the abdomen or pelvis. Atherosclerotic calcifications of the abdominal aorta and branch vessels. Left renal sinus cysts. Sigmoid diverticulosis, without evidence of diverticulitis. Prostatomegaly. SKELETON: Vague hypermetabolism involving the inferior sternum, max SUV 3.2, previously 8.2. Focal hypermetabolism involving the left sacrum, max SUV 4.0, previously 6.7. EXTREMITIES: No abnormal hypermetabolic activity in the lower extremities.   02/18/2017 -  Chemotherapy   Bortezomib 1.3mg /m2 QWk + Dexamethasone 10mg  QTue/Fri --Cycle #1, 02/18/17    02/18/2017 - 08/26/2017 Chemotherapy   The patient had bortezomib SQ (VELCADE) chemo injection 3 mg, 1.3 mg/m2 = 3 mg, Subcutaneous,  Once, 7 of 9 cycles Administration: 3 mg (02/18/2017), 3 mg (02/26/2017), 3 mg (03/04/2017), 3 mg (03/11/2017),  3 mg (04/08/2017), 3 mg (03/18/2017), 3 mg (03/25/2017), 3 mg (04/01/2017), 3 mg (05/06/2017), 3 mg (05/20/2017), 3 mg (06/03/2017), 3 mg (06/17/2017), 3 mg (07/01/2017), 3 mg (07/15/2017), 3 mg (07/29/2017), 3 mg (08/12/2017), 3 mg (08/26/2017)  for chemotherapy treatment.    09/17/2017 - 10/02/2021 Chemotherapy   Patient is on Treatment Plan : MYELOMA Daratumumab q28d     09/17/2017 - 12/04/2021 Chemotherapy   Patient is on Treatment Plan : MYELOMA Daratumumab SQ q28d       ALLERGIES:  is  allergic to codeine, morphine and codeine, and revlimid [lenalidomide].  MEDICATIONS:  Current Facility-Administered Medications  Medication Dose Route Frequency Provider Last Rate Last Admin   acetaminophen (TYLENOL) tablet 500-1,000 mg  500-1,000 mg Oral Q6H PRN Mikey College T, MD   1,000 mg at 12/23/22 1128   ALPRAZolam (XANAX) tablet 0.25 mg  0.25 mg Oral QHS Mikey College T, MD   0.25 mg at 12/22/22 2100   amoxicillin (AMOXIL) capsule 500 mg  500 mg Oral Q8H Rosezetta Schlatter T, MD   500 mg at 12/23/22 1128   apixaban (ELIQUIS) tablet 5 mg  5 mg Oral BID Mikey College T, MD   5 mg at 12/23/22 0917   docusate sodium (COLACE) capsule 100 mg  100 mg Oral BID Rosezetta Schlatter T, MD   100 mg at 12/23/22 1129   feeding supplement (ENSURE ENLIVE / ENSURE PLUS) liquid 237 mL  237 mL Oral TID BM Mikey College T, MD   237 mL at 12/23/22 0917   ferrous sulfate tablet 325 mg  325 mg Oral Daily Mikey College T, MD   325 mg at 12/23/22 0917   fluconazole (DIFLUCAN) tablet 200 mg  200 mg Oral Daily Rosezetta Schlatter T, MD   200 mg at 12/23/22 1128   gabapentin (NEURONTIN) capsule 300 mg  300 mg Oral BID Mikey College T, MD   300 mg at 12/23/22 0917   guaiFENesin-dextromethorphan (ROBITUSSIN DM) 100-10 MG/5ML syrup 5 mL  5 mL Oral Q4H PRN Mikey College T, MD       melatonin tablet 10 mg  10 mg Oral QHS Mikey College T, MD   10 mg at 12/22/22 2100   metoprolol tartrate (LOPRESSOR) injection 2.5 mg  2.5 mg Intravenous Q4H PRN Mikey College T, MD   2.5 mg at 12/19/22 1802   metoprolol tartrate (LOPRESSOR) tablet 25 mg  25 mg Oral BID Arnetha Courser, MD   25 mg at 12/23/22 0917   montelukast (SINGULAIR) tablet 10 mg  10 mg Oral QHS Mikey College T, MD   10 mg at 12/22/22 2100   multivitamin with minerals tablet 1 tablet  1 tablet Oral Daily Arnetha Courser, MD   1 tablet at 12/23/22 5366   nutrition supplement (JUVEN) (JUVEN) powder packet 1 packet  1 packet Oral BID BM Arnetha Courser, MD   1 packet at 12/23/22 0917   ondansetron (ZOFRAN) tablet  4 mg  4 mg Oral Q6H PRN Mikey College T, MD       Or   ondansetron [facility]) injection 4 mg  4 mg Intravenous Q6H PRN Mikey College T, MD       oxyCODONE (Oxy IR/ROXICODONE) immediate release tablet 10 mg  10 mg Oral Q6H PRN Mikey College T, MD   10 mg at 12/20/22 4403   potassium chloride SA (KLOR-CON M) CR tablet 40 mEq  40 mEq Oral Q4H Loyce Dys, MD   40 mEq at 12/23/22 0917   tamsulosin (  FLOMAX) capsule 0.4 mg  0.4 mg Oral QPC supper Mikey College T, MD   0.4 mg at 12/22/22 1737   traZODone (DESYREL) tablet 100 mg  100 mg Oral QHS Mikey College T, MD   100 mg at 12/22/22 2100   Facility-Administered Medications Ordered in Other Encounters  Medication Dose Route Frequency Provider Last Rate Last Admin   heparin lock flush 100 unit/mL  500 Units Intravenous Once Jeralyn Ruths, MD        VITAL SIGNS: BP 124/89 (BP Location: Right Arm)   Pulse 84   Temp 98.2 F (36.8 C) (Oral)   Resp 20   Wt 191 lb (86.6 kg)   SpO2 98%   BMI 29.04 kg/m  Filed Weights   12/19/22 1137  Weight: 191 lb (86.6 kg)    Estimated body mass index is 29.04 kg/m as calculated from the following:   Height as of 11/24/22: 5\' 8"  (1.727 m).   Weight as of this encounter: 191 lb (86.6 kg).  LABS: CBC:    Component Value Date/Time   WBC 5.5 12/23/2022 0357   HGB 9.1 (L) 12/23/2022 0357   HCT 29.0 (L) 12/23/2022 0357   PLT 178 12/23/2022 0357   MCV 95.1 12/23/2022 0357   NEUTROABS 3.2 12/23/2022 0357   LYMPHSABS 1.6 12/23/2022 0357   MONOABS 0.6 12/23/2022 0357   EOSABS 0.1 12/23/2022 0357   BASOSABS 0.0 12/23/2022 0357   Comprehensive Metabolic Panel:    Component Value Date/Time   NA 139 12/23/2022 0357   K 3.3 (L) 12/23/2022 0357   CL 107 12/23/2022 0357   CO2 26 12/23/2022 0357   BUN 28 (H) 12/23/2022 0357   CREATININE 0.58 (L) 12/23/2022 0357   CREATININE 0.51 (L) 03/20/2022 0914   CREATININE 0.81 03/26/2021 1117   GLUCOSE 97 12/23/2022 0357   CALCIUM 8.8 (L) 12/23/2022 0357   AST 20  12/19/2022 1150   AST 18 03/20/2022 0914   ALT 6 12/19/2022 1150   ALT 13 03/20/2022 0914   ALKPHOS 23 (L) 12/19/2022 1150   BILITOT 0.7 12/19/2022 1150   BILITOT 0.4 03/20/2022 0914   PROT 8.3 (H) 12/19/2022 1150   ALBUMIN 2.1 (L) 12/19/2022 1150    RADIOGRAPHIC STUDIES: DG Chest 1 View  Result Date: 12/20/2022 CLINICAL DATA:  CHF EXAM: CHEST  1 VIEW COMPARISON:  Yesterday FINDINGS: Normal heart size for rotated technique. Stable mediastinal contours. Chronic interstitial coarsening diffusely. Band of scar-like density in the right mid lung. Chronic hazy density in the retrocardiac lung, seen on multiple studies. There is no edema, significant effusion, or pneumothorax. IMPRESSION: Chronic lung disease without convincing change from recent priors to suggest acute process. Electronically Signed   By: Tiburcio Pea M.D.   On: 12/20/2022 05:42   DG Chest Port 1 View  Result Date: 12/19/2022 CLINICAL DATA:  Sepsis EXAM: PORTABLE CHEST 1 VIEW COMPARISON:  11/23/2022 FINDINGS: Mild cardiomegaly. Aortic atherosclerosis. Streaky retrocardiac opacity. No pleural effusion or pneumothorax. Chronic bilateral rib fractures and left clavicular fracture. IMPRESSION: Streaky retrocardiac opacity, atelectasis versus pneumonia. Electronically Signed   By: Duanne Guess D.O.   On: 12/19/2022 15:56   DG Swallowing Func-Speech Pathology  Result Date: 11/26/2022 Table formatting from the original result was not included. Modified Barium Swallow Study Patient Details Name: Dean [name]. MRN: [id number] Date of Birth: 24-Sep-1935 Today's Date: 11/26/2022 HPI/PMH: Pt  is a 87 year old male with history of multiple myeloma, end-stage, hypertension, BPH, insomnia, paroxysmal atrial fibrillation, history of PE,  on Eliquis, who presents to the emergency department for chief concerns of weakness for 1 week+ and has been more sedentary.  he was recently being treated for pneumonia.  Per ED note: "Pt is a cancer pt  and lives with his daughter. Pt has been sick and taking a z-pack for the last week but is not feeling better. Pt has strong smell of urine per EMS. EMS reports pt is AMS and weak. Pt gets cancer treatment here but has stopped chemo and radiating because he is end stage.".  CT head wo contrast: Read as no acute intracranial abnormalities.  Moderate cerebral and cerebellar atrophy with chronic microvascular ischemic changes in the cerebral white matter.  Numerous osseous lesions again noted in the skull compatible with history of multiple myeloma.   CXR: New left retrocardiac opacity, may be due to atelectasis,  infection or aspiration.  2. Small bilateral pleural effusions. Clinical Impression Patient presents with functional oropharyngeal swallowing for age. No aspiration nor laryngeal penetration occurred during this study. Pt required some setup support d/t general overall weakness. Oral stage is characterized by adequate lip closure, bolus preparation and containment, and anterior to posterior transit. Full oral clearing achieved b/t boluses. Min extra time taken for mastication of increased texture appearing d/t Missing Molars/Dentition. Swallow initiation occurs spilling to/half filling the pyriform sinuses w/ thin liquids; the remaining bolus consistencies at/around the valleculae.  Pharyngeal stage is noted for functional tongue base retraction, adequate hyolaryngeal excursion, and adequate pharyngeal constriction. No significant pharyngeal residue remained post swallow. Epiglottic deflection is complete; there was No laryngeal penetration nor aspiration noted during this study. Pharyngeal stripping wave is complete. Amplitude/duration of cricopharyngeus opening is WFL. There is adequate/complete clearance through the viewable cervical esophagus. A 13 mm barium tablet was given in Puree(for safer swallowing) which cleared to oropharynx appropriately. Consistencies tested were thin liquids x2 tsps, 3 cup  sips, no sequential sips for general precaution, nectar x1 tsp, 1 cup sip. honey x1 tsp, pudding x1 tsp, regular solid 2 (1/2 graham cracker with pudding), and 13 mm barium tablet with Puree.  Recommend patient continue mech soft diet with thin liquids as orderd at BSE w/ general aspiration precautions including No straw, Single sips/bites, Small bites/sips; general aspiration precautions. Education provided to pt verbally and via reviewing the video together. No further ST services indicated. Factors that may increase risk of adverse event in presence of aspiration [employer] 2021): Poor general health and/or compromised immunity;Frail or deconditioned (Cancer) Swallow Evaluation Recommendations Recommendations: PO diet PO Diet Recommendation: Dysphagia 3 (Mechanical soft);Thin liquids (Level 0) Liquid Administration via: Cup; NO STRAW USE when drinking Medication Administration: Whole Meds with PUREE Supervision: Patient able to self-feed;Intermittent supervision/cueing for swallowing strategies;Set-up assistance for safety Swallowing strategies: Minimize environmental distractions;Slow rate;Small bites/sips;Follow solids with liquids Postural changes: Position pt fully upright for meals;Stay upright 30-60 min after meals;Out of bed for meals Oral care recommendations: Oral care BID (2x/day);Pt independent with oral care (setup support) Recommended consults: Consider dietitian consultation;Consider Palliative care for support Treatment Plan Treatment Plan Treatment recommendations: No treatment recommended at this time Follow-up recommendations: No SLP follow up Recommendations Comment: general aspiration precautions Functional status assessment: Patient has had a recent decline in their functional status and demonstrates the ability to make significant improvements in function in a reasonable and predictable amount of time. Treatment frequency: -- (n/a) Treatment duration: -- (n/a) Interventions:  Aspiration precaution training; Patient/family education Recommendations Recommendations for follow up therapy are one component of a multi-disciplinary  discharge planning process, led by the attending physician.  Recommendations may be updated based on patient status, additional functional criteria and insurance authorization. Assessment: Orofacial Exam: Orofacial Exam Oral Cavity: Oral Hygiene: WFL Oral Cavity - Dentition: Missing dentition Orofacial Anatomy: WFL Oral Motor/Sensory Function: WFL Anatomy: Anatomy: WFL Boluses Administered: Boluses Administered Boluses Administered: Thin liquids (Level 0); Mildly thick liquids (Level 2, nectar thick); Moderately thick liquids (Level 3, honey thick); Puree; Solid (Tablet in puree)  Oral Impairment Domain: Oral Impairment Domain Lip Closure: No labial escape Tongue control during bolus hold: Cohesive bolus between tongue to palatal seal Bolus preparation/mastication: Timely and efficient chewing and mashing (in setting of missing molars) Bolus transport/lingual motion: Brisk tongue motion Oral residue: Complete oral clearance Location of oral residue : N/A Initiation of pharyngeal swallow : Valleculae; Posterior laryngeal surface of the epiglottis; Pyriform sinuses (spilling to/half filling the pyriform sinuses w/ thin liquids; the remaining boluses at/around the valleculae)  Pharyngeal Impairment Domain: Pharyngeal Impairment Domain Soft palate elevation: No bolus between soft palate (SP)/pharyngeal wall (PW) Laryngeal elevation: Complete superior movement of thyroid cartilage with complete approximation of arytenoids to epiglottic petiole Anterior hyoid excursion: Complete anterior movement Epiglottic movement: Complete inversion Laryngeal vestibule closure: Complete, no air/contrast in laryngeal vestibule Pharyngeal stripping wave : Present - complete Pharyngeal contraction (A/P view only): N/A Pharyngoesophageal segment opening: Complete distension and complete  duration, no obstruction of flow Tongue base retraction: No contrast between tongue base and posterior pharyngeal wall (PPW) Pharyngeal residue: Complete pharyngeal clearance Location of pharyngeal residue: N/A  Esophageal Impairment Domain: Esophageal Impairment Domain Esophageal clearance upright position: Complete clearance, esophageal coating (viewable) Pill: Pill Consistency administered: Puree Puree: WFL Penetration/Aspiration Scale Score: Penetration/Aspiration Scale Score 1.  Material does not enter airway: Thin liquids (Level 0); Mildly thick liquids (Level 2, nectar thick); Moderately thick liquids (Level 3, honey thick); Puree; Solid; Pill Compensatory Strategies: Compensatory Strategies Compensatory strategies: No (Cup Drinking)   General Information: Caregiver present: No  Diet Prior to this Study: Dysphagia 3 (mechanical soft); Thin liquids (Level 0) (this admit)   Temperature : Normal   Respiratory Status: WFL   Supplemental O2: None (Room air)   History of Recent Intubation: No  Behavior/Cognition: Alert; Cooperative; Pleasant mood; Requires cueing (min) Self-Feeding Abilities: Able to self-feed; Needs set-up for self-feeding Baseline vocal quality/speech: Normal Volitional Cough: Able to elicit Volitional Swallow: Able to elicit Exam Limitations: No limitations (generalized weakness) Goal Planning: Prognosis for improved oropharyngeal function: Fair (-Good) Barriers to Reach Goals: Time post onset; Severity of deficits Barriers/Prognosis Comment: weakness from Cancer Patient/Family Stated Goal: to feel better Consulted and agree with results and recommendations: Patient; Physician; Nurse Pain: Pain Assessment Pain Assessment: No/denies pain Pain Score: -- ("moderate") Faces Pain Scale: 2 Pain Location: R shoulder radiating towards R hand, aching pain Pain Descriptors / Indicators: Aching Pain Intervention(s): Limited activity within patient's tolerance; Monitored during session End of Session: Start  Time:SLP Start Time (ACUTE ONLY): 0755 Stop Time: SLP Stop Time (ACUTE ONLY): 0905 Time Calculation:SLP Time Calculation (min) (ACUTE ONLY): 70 min Charges: SLP Evaluations $ SLP Speech Visit: 1 Visit SLP Evaluations $MBS Swallow: 1 Procedure SLP visit diagnosis: SLP Visit Diagnosis: Dysphagia, unspecified (R13.10) Past Medical History: Past Medical History: Diagnosis Date  BPH (benign prostatic hyperplasia)   Colonic diverticular abscess 01/08/2015  Diverticulitis 12/22/2014  complicated by abscess and required percutaneous drainage  GERD (gastroesophageal reflux disease)   H/O ETOH abuse   History of chemotherapy last done jan 2017  History of radiation therapy  01/05/13-02/10/13  45 gray to left occipital condyle region  History of radiation therapy 12/01/16-12/10/16  Parasternal nodule, chest- 24 Gy total delivered in 8 fractions, Left sacro-iliac, pelvis- 24 Gy total delivered in 8 fractions   History of radiation therapy 02/19/17-02/22/17  right temporal scalp 30 Gy in 10 fractions  History of radiation therapy   Right hip( Pelvis) 11/18/21-11/21/21-Dr. Antony Blackbird  Intra-abdominal abscess [facility])   Multiple myeloma (HCC) 2015  Neuropathy   Radiation 02/21/14-03/08/14  right posterior chest wall area 30 gray  Radiation 04/19/14-05/02/14  lumbar spine 25 gray  Skull lesion   Left occipital condyle  Wrist fracture, left   x 2 Past Surgical History: Past Surgical History: Procedure Laterality Date  COLONOSCOPY N/A 04/19/2015  Procedure: COLONOSCOPY;  Surgeon: West Bali, MD;  [facility];  Service: Endoscopy;  Laterality: N/A;  1:30 PM  CYST EXCISION  1959  tail bone  CYSTOSCOPY WITH FULGERATION N/A 08/20/2022  Procedure: CYSTOSCOPY WITH FULGERATION;  Surgeon: Crista Elliot, MD;  Location: [facility];  Service: Urology;  Laterality: N/A;  cysto clot evac  IR IMAGING GUIDED PORT INSERTION  09/25/2017  IR REMOVAL TUN ACCESS W/ PORT W/O FL MOD SED  10/21/2022 Watson,Katherine 11/26/2022, 11:17 AM  MR SHOULDER  RIGHT W WO CONTRAST  Result Date: 11/23/2022 CLINICAL DATA:  Soft tissue infection suspected, shoulder, xray done. Multiple myeloma EXAM: MRI OF THE RIGHT SHOULDER WITHOUT AND WITH CONTRAST TECHNIQUE: Multiplanar, multisequence MR imaging of the right shoulder shoulder was performed before and after the administration of intravenous contrast. CONTRAST:  9mL GADAVIST GADOBUTROL 1 MMOL/ML IV SOLN COMPARISON:  CT chest 08/19/2022 FINDINGS: Technical Note: Despite efforts by the technologist and patient, motion artifact is present on today's exam and could not be eliminated. This reduces exam sensitivity and specificity. Rotator cuff: Moderate talar cuff tendinosis with small partial-thickness rim rent tear of the anterior supraspinatus tendon insertion. No full-thickness or retracted rotator cuff tear. Muscles: Generalized muscle atrophy. Mild supraspinatus and infraspinatus intramuscular edema. Biceps long head: Intra-articular biceps tendon is not identified, likely torn and retracted. Acromioclavicular Joint: Small-moderate acromioclavicular joint effusion with synovial enhancement. Mild bone marrow edema within the distal clavicle and adjacent acromion. Mild subacromial-subdeltoid bursitis. Glenohumeral Joint: No glenohumeral joint effusion. Moderate diffuse chondral thinning and surface irregularity. Labrum:  Diffusely degenerated. Bones: Numerous marrow replacing bone lesions throughout the imaged scapula and proximal humerus including dominant lesion centered within the posteroinferior glenoid measuring 3.1 x 2.2 x 2.8 cm. There is cortical breakthrough and extraosseous extension of tumor along the posterior glenoid neck. Additional expansile bone lesion within in adjacent upper right rib (series 11, image 17). Shoulder joint intact without fracture or dislocation. Other: No fluid collections about the shoulder. IMPRESSION: 1. Small-moderate acromioclavicular joint effusion with synovial enhancement. Mild  bone marrow edema within the distal clavicle and adjacent acromion. Findings are nonspecific and may be degenerative or reactive. Septic arthritis is not excluded in the appropriate clinical setting. Correlate with serum inflammatory markers. 2. Numerous marrow replacing bone lesions throughout the imaged scapula and right proximal humerus including dominant lesion centered within the posteroinferior glenoid measuring 3.1 x 2.2 x 2.8 cm. There is cortical breakthrough and extraosseous extension of tumor along the posterior glenoid neck. Additional expansile bone lesion within an adjacent upper right rib. Findings are compatible with the patient's history of multiple myeloma. 3. Moderate rotator cuff tendinosis with small partial-thickness rim rent tear of the anterior supraspinatus tendon insertion. No full-thickness or retracted rotator cuff tear.  4. Mild subacromial-subdeltoid bursitis. 5. Intra-articular biceps tendon is not identified, likely torn and retracted. Electronically Signed   By: Duanne Guess D.O.   On: 11/23/2022 20:11    PERFORMANCE STATUS (ECOG) : 4 - Bedbound  Review of Systems Unless otherwise noted, a complete review of systems is negative.  Physical Exam General: Frail appearing Pulmonary: Unlabored Extremities: no edema, no joint deformities Skin: no rashes Neurological: Weakness but otherwise nonfocal  IMPRESSION: Follow-up visit.  Patient seen over the weekend by PMT.   Met with patient and daughter to discuss goals.  Patient says he was told that he probably will discharge tomorrow back to rehab.  Patient says that he is still interested in pursuing rehab and current scope of treatment.  He would like to treat the treatable.  He still has hope that he may at some point in the future be able to pursue further cancer treatment.  This seems unlikely.  Hospice has been discussed multiple times in the past.  I strongly recommended that palliative care follow him at rehab  and help transition to hospice when appropriate.  PLAN: -Recommend palliative care following at rehab   Time Total: 25 minutes  Visit consisted of counseling and education dealing with the complex and emotionally intense issues of symptom management and palliative care in the setting of serious and potentially life-threatening illness.Greater than 50%  of this time was spent counseling and coordinating care related to the above assessment and plan.  Signed by: Laurette Schimke, PhD, NP-C

## 2022-12-23 NOTE — Progress Notes (Addendum)
Progress Note   Patient: Dean [name]. ZOX:[id number] DOB: April 10, 1935 DOA: 12/19/2022     3 DOS: the patient was seen and examined on 12/23/2022     Brief hospital course: Taken from H&P.   Dean [name]. is a 87 y.o. male with medical history significant of multiple myeloma end-stage, HTN, BPH, PAF on Eliquis, PE on Eliquis, who was just recently discharged from hospital to nursing home, sent back from nursing home today for evaluation of worsening of confusion and possible UTI failed outpatient management.    Patient was hospitalized 3 weeks ago for treatment of hypoxia and aspiration pneumonia. Discharged to nursing home and patient continued on p.o. antibiotics for the pneumonia for another 7 days. Initially patient recovered well but about 1 week ago patient started develop lethargy, generalized weakness and strong smell of urine and rehab center started him on IV antibiotic and today will be the last day. Despite, patient continues to have daytime sleepiness, generalized weakness and significant decreased oral intake for 7 days. No fever as per daughter.  No obvious urinary symptoms.   On presentation patient was febrile at 100, HR 120, EKG A-fib with RVR, softer blood pressure at 97/73, lactic acid 2.8, no leukocytosis. Also found to have pressure injury, stage II on sacral and a shallow ulcer on left lateral ankle.   Patient was given cefepime, vancomycin and Flagyl in ED. continued on cefepime   Per SNF record, urine cultures from 12/11/2022 showed  Morganella MDR, sensitive to cefepime, imipenem.  Patient was also on multiple sedating medications which were cut back.   11/16: Afebrile, heart rate in low 100s, lactic acidosis resolved, troponin remain negative, preliminary blood cultures negative in 24-hour, UA and urine cultures were ordered but unfortunately never sent on admission and patient is currently on antibiotic.  No leukocytosis, hemoglobin decreased to 8.9 but all  cell line decreased, likely some dilutional effect. UA done this morning with leukocytosis, microscopic hematuria and bacteria, cultures pending now.   Patient with progressive decline, history of end-stage multiple myeloma and has been off from chemotherapy for the past couple of month.  Palliative care from cancer center and his oncologist has already suggested hospice but apparently patient declined at that time.  Per daughter he was just thinking that hospice mean only 2 to 3 days of life.  Poor p.o. intake and recurrent UTI, unable to explain any urinary symptoms.  Increased lethargy can be due to sedating medications.  Overall patient is high risk for mortality and is hospice appropriate based on advanced age and underlying life limiting comorbidities.  Palliative care was consulted after discussing with daughter who is his POA .   11/17: Vitals stable, pending urine culture.  Appetite remained poor.     Assessment and Plan: Acute metabolic encephalopathy likely secondary to UTI -Palliative care was also consulted Continue current antibiotics   UTI (urinary tract infection) Patient with history of multiple UTI, prior urine culture from 12/11/2022 which showed Morganella MDR, sensitive to cefepime, imipenem.  Unable to explain any urinary symptoms but no leukocytosis or fever. History of multiple different antibiotics use. UA and urine culture were ordered on admission but unfortunately never done until this morning, patient was already on cefepime. Might be just colonization. Antibiotics switched to amoxicillin Follow-up on urine culture sensitivity results currently growing Enterococcus   Multiple myeloma without remission [facility]) Oncology has stopped treatment over the past few month due to overall decline, poor response and they advise for  hospice which he initially declined. Overall progressive decline. -Palliative care was consulted Continue with pain management-doses were decreased  to see if that will affect his mentation   Failure to thrive in adult Poor p.o. intake and progressive decline which is consistent with failure to thrive over the past couple of month. Follow-up on palliative care recommendation   Chronic atrial fibrillation with RVR (HCC) Patient was in A-fib with RVR, fluctuating heart rate which increases with some activity. -Increasing the dose of metoprolol to 25 mg twice daily Continue Eliquis   BPH (benign prostatic hyperplasia) Family requested no cath for urine as patient sustained severe ureteral damage on the recent Foley insertion in summer this year. Continue Flomax   Neuropathy (HCC) Continue Neurontin-dose was decreased from 400 to 300 mg during current hospitalization.       Subjective:  Patient denies any abdominal pain nausea vomiting chest pain or cough Urine culture still pending   Physical Exam: General.  Frail elderly man, in no acute distress. Pulmonary.  Lungs clear bilaterally, normal respiratory effort. CV.  Regular rate and rhythm, no JVD, rub or murmur. Abdomen.  Soft, nontender, nondistended, BS positive. CNS.  Alert and oriented .  No focal neurologic deficit. Extremities.  No edema, no cyanosis, pulses intact and symmetrical.       Family Communication:    Disposition: Status is: Inpatient Remains inpatient appropriate because: Severity of illness  Planned Discharge Destination:  To be determined, appropriate for hospice,   DVT prophylaxis.  Eliquis   Time spent: 41 minutes    Data Reviewed:     Latest Ref Rng & Units 12/23/2022    3:57 AM 12/20/2022    3:25 AM 12/19/2022   11:50 AM  BMP  Glucose 70 - 99 mg/dL 97  409  97   BUN 8 - 23 mg/dL 28  21  17    Creatinine 0.61 - 1.24 mg/dL 8.11  9.14  7.82   Sodium 135 - 145 mmol/L 139  140  138   Potassium 3.5 - 5.1 mmol/L 3.3  3.0  3.5   Chloride 98 - 111 mmol/L 107  106  103   CO2 22 - 32 mmol/L 26  28  26    Calcium 8.9 - 10.3 mg/dL 8.8  9.1  8.7         Latest Ref Rng & Units 12/23/2022    3:57 AM 12/20/2022    3:25 AM 12/19/2022   11:50 AM  CBC  WBC 4.0 - 10.5 K/uL 5.5  5.2  6.8   Hemoglobin 13.0 - 17.0 g/dL 9.1  8.9  95.6   Hematocrit 39.0 - 52.0 % 29.0  29.3  35.0   Platelets 150 - 400 K/uL 178  146  197     Vitals:   12/22/22 2029 12/23/22 0359 12/23/22 0800 12/23/22 1601  BP: (!) 120/58 137/62 124/89 111/76  Pulse: (!) 108 (!) 101 84 95  Resp: 16 16 20 16   Temp: 97.8 F (36.6 C) 98.5 F (36.9 C) 98.2 F (36.8 C) 97.7 F (36.5 C)  TempSrc:  Oral Oral Oral  SpO2: 98% 100% 98% 100%  Weight:       Author: Loyce Dys, MD 12/23/2022 4:07 PM  For on call review [email or web address].

## 2022-12-23 NOTE — Plan of Care (Signed)

## 2022-12-23 NOTE — Care Management Important Message (Signed)
Important Message  Patient Details  Name: Dean [name]. MRN: [id number] Date of Birth: 1935-12-22   Important Message Given:  Yes - Medicare IM     Bernadette Hoit 12/23/2022, 10:51 AM

## 2022-12-24 DIAGNOSIS — K5909 Other constipation: Secondary | ICD-10-CM | POA: Diagnosis not present

## 2022-12-24 DIAGNOSIS — I5033 Acute on chronic diastolic (congestive) heart failure: Secondary | ICD-10-CM | POA: Diagnosis not present

## 2022-12-24 DIAGNOSIS — K219 Gastro-esophageal reflux disease without esophagitis: Secondary | ICD-10-CM | POA: Diagnosis not present

## 2022-12-24 DIAGNOSIS — S41111D Laceration without foreign body of right upper arm, subsequent encounter: Secondary | ICD-10-CM | POA: Diagnosis not present

## 2022-12-24 DIAGNOSIS — G4700 Insomnia, unspecified: Secondary | ICD-10-CM | POA: Diagnosis not present

## 2022-12-24 DIAGNOSIS — Z515 Encounter for palliative care: Secondary | ICD-10-CM | POA: Diagnosis not present

## 2022-12-24 DIAGNOSIS — F419 Anxiety disorder, unspecified: Secondary | ICD-10-CM | POA: Diagnosis not present

## 2022-12-24 DIAGNOSIS — N390 Urinary tract infection, site not specified: Secondary | ICD-10-CM | POA: Diagnosis not present

## 2022-12-24 DIAGNOSIS — G629 Polyneuropathy, unspecified: Secondary | ICD-10-CM | POA: Diagnosis not present

## 2022-12-24 DIAGNOSIS — L89520 Pressure ulcer of left ankle, unstageable: Secondary | ICD-10-CM | POA: Diagnosis not present

## 2022-12-24 DIAGNOSIS — I10 Essential (primary) hypertension: Secondary | ICD-10-CM | POA: Diagnosis not present

## 2022-12-24 DIAGNOSIS — L89616 Pressure-induced deep tissue damage of right heel: Secondary | ICD-10-CM | POA: Diagnosis not present

## 2022-12-24 DIAGNOSIS — R059 Cough, unspecified: Secondary | ICD-10-CM | POA: Diagnosis not present

## 2022-12-24 DIAGNOSIS — G9341 Metabolic encephalopathy: Secondary | ICD-10-CM | POA: Diagnosis not present

## 2022-12-24 DIAGNOSIS — R41841 Cognitive communication deficit: Secondary | ICD-10-CM | POA: Diagnosis not present

## 2022-12-24 DIAGNOSIS — A419 Sepsis, unspecified organism: Secondary | ICD-10-CM | POA: Diagnosis not present

## 2022-12-24 DIAGNOSIS — E440 Moderate protein-calorie malnutrition: Secondary | ICD-10-CM | POA: Diagnosis not present

## 2022-12-24 DIAGNOSIS — B952 Enterococcus as the cause of diseases classified elsewhere: Secondary | ICD-10-CM | POA: Diagnosis not present

## 2022-12-24 DIAGNOSIS — M6281 Muscle weakness (generalized): Secondary | ICD-10-CM | POA: Diagnosis not present

## 2022-12-24 DIAGNOSIS — N400 Enlarged prostate without lower urinary tract symptoms: Secondary | ICD-10-CM | POA: Diagnosis not present

## 2022-12-24 DIAGNOSIS — Z86711 Personal history of pulmonary embolism: Secondary | ICD-10-CM | POA: Diagnosis not present

## 2022-12-24 DIAGNOSIS — I4891 Unspecified atrial fibrillation: Secondary | ICD-10-CM | POA: Diagnosis not present

## 2022-12-24 DIAGNOSIS — S51021A Laceration with foreign body of right elbow, initial encounter: Secondary | ICD-10-CM | POA: Diagnosis not present

## 2022-12-24 DIAGNOSIS — I482 Chronic atrial fibrillation, unspecified: Secondary | ICD-10-CM | POA: Diagnosis not present

## 2022-12-24 DIAGNOSIS — L89150 Pressure ulcer of sacral region, unstageable: Secondary | ICD-10-CM | POA: Diagnosis not present

## 2022-12-24 DIAGNOSIS — R52 Pain, unspecified: Secondary | ICD-10-CM | POA: Diagnosis not present

## 2022-12-24 DIAGNOSIS — C9000 Multiple myeloma not having achieved remission: Secondary | ICD-10-CM | POA: Diagnosis not present

## 2022-12-24 DIAGNOSIS — L89154 Pressure ulcer of sacral region, stage 4: Secondary | ICD-10-CM | POA: Diagnosis not present

## 2022-12-24 DIAGNOSIS — R627 Adult failure to thrive: Secondary | ICD-10-CM | POA: Diagnosis not present

## 2022-12-24 DIAGNOSIS — S51012A Laceration without foreign body of left elbow, initial encounter: Secondary | ICD-10-CM | POA: Diagnosis not present

## 2022-12-24 LAB — CBC WITH DIFFERENTIAL/PLATELET
Abs Immature Granulocytes: 0.08 10*3/uL — ABNORMAL HIGH (ref 0.00–0.07)
Basophils Absolute: 0 10*3/uL (ref 0.0–0.1)
Basophils Relative: 0 %
Eosinophils Absolute: 0.1 10*3/uL (ref 0.0–0.5)
Eosinophils Relative: 1 %
HCT: 27.2 % — ABNORMAL LOW (ref 39.0–52.0)
Hemoglobin: 8.6 g/dL — ABNORMAL LOW (ref 13.0–17.0)
Immature Granulocytes: 1 %
Lymphocytes Relative: 33 %
Lymphs Abs: 1.9 10*3/uL (ref 0.7–4.0)
MCH: 29.8 pg (ref 26.0–34.0)
MCHC: 31.6 g/dL (ref 30.0–36.0)
MCV: 94.1 fL (ref 80.0–100.0)
Monocytes Absolute: 0.7 10*3/uL (ref 0.1–1.0)
Monocytes Relative: 12 %
Neutro Abs: 3.1 10*3/uL (ref 1.7–7.7)
Neutrophils Relative %: 53 %
Platelets: 184 10*3/uL (ref 150–400)
RBC: 2.89 MIL/uL — ABNORMAL LOW (ref 4.22–5.81)
RDW: 15.9 % — ABNORMAL HIGH (ref 11.5–15.5)
WBC: 5.8 10*3/uL (ref 4.0–10.5)
nRBC: 0.3 % — ABNORMAL HIGH (ref 0.0–0.2)

## 2022-12-24 LAB — BASIC METABOLIC PANEL
Anion gap: 1 — ABNORMAL LOW (ref 5–15)
BUN: 28 mg/dL — ABNORMAL HIGH (ref 8–23)
CO2: 26 mmol/L (ref 22–32)
Calcium: 9.1 mg/dL (ref 8.9–10.3)
Chloride: 112 mmol/L — ABNORMAL HIGH (ref 98–111)
Creatinine, Ser: 0.51 mg/dL — ABNORMAL LOW (ref 0.61–1.24)
GFR, Estimated: >60 mL/min (ref >60)
Glucose, Bld: 98 mg/dL (ref 70–99)
Potassium: 4.2 mmol/L (ref 3.5–5.1)
Sodium: 139 mmol/L (ref 135–145)

## 2022-12-24 LAB — CULTURE, BLOOD (ROUTINE X 2)
Culture: NO GROWTH
Culture: NO GROWTH
Special Requests: ADEQUATE

## 2022-12-24 MED ORDER — FLUCONAZOLE 200 MG PO TABS: 200.0000 mg | ORAL_TABLET | Freq: Every day | ORAL | Status: AC

## 2022-12-24 MED ORDER — ALPRAZOLAM 0.25 MG PO TABS: 0.2500 mg | ORAL_TABLET | Freq: Every day | ORAL | Status: DC

## 2022-12-24 MED ORDER — DOCUSATE SODIUM 100 MG PO CAPS: 100.0000 mg | ORAL_CAPSULE | Freq: Two times a day (BID) | ORAL | Status: DC

## 2022-12-24 MED ORDER — OXYCODONE HCL 10 MG PO TABS: 10.0000 mg | ORAL_TABLET | Freq: Four times a day (QID) | ORAL | 0 refills | Status: AC | PRN

## 2022-12-24 MED ORDER — ALPRAZOLAM 0.25 MG PO TABS: 0.2500 mg | ORAL_TABLET | Freq: Every day | ORAL | 0 refills | Status: AC

## 2022-12-24 MED ORDER — TRAZODONE HCL 100 MG PO TABS: 100.0000 mg | ORAL_TABLET | Freq: Every day | ORAL | Status: DC

## 2022-12-24 MED ORDER — AMOXICILLIN 500 MG PO CAPS: 500.0000 mg | ORAL_CAPSULE | Freq: Three times a day (TID) | ORAL | Status: AC

## 2022-12-24 NOTE — Discharge Summary (Signed)
Physician Discharge Summary   Patient: Dean [name]. MRN: [id number] DOB: 1935/08/01  Admit date:     12/19/2022  Discharge date: 12/24/22  Discharge Physician: Loyce Dys   PCP: Donita Brooks, MD   Recommendations at discharge:  Follow-up with primary care physician  Discharge Diagnoses: Acute metabolic encephalopathy likely secondary to UTI UTI (urinary tract infection) Multiple myeloma without remission (HCC) Failure to thrive in adult Chronic atrial fibrillation with RVR (HCC) BPH (benign prostatic hyperplasia) Neuropathy [facility])   Hospital Course: From HPI "Dean [name]. is a 87 y.o. male with medical history significant of multiple myeloma end-stage, HTN, BPH, PAF on Eliquis, PE on Eliquis, who was just recently discharged from hospital to nursing home, sent back from nursing home for evaluation of worsening of confusion and possible UTI failed outpatient management.    Patient was hospitalized 3 weeks ago for treatment of hypoxia and aspiration pneumonia. Discharged to nursing home and patient continued on p.o. antibiotics for the pneumonia for another 7 days. Initially patient recovered well but about 1 week ago patient started develop lethargy, generalized weakness and strong smell of urine and rehab center started him on IV antibiotic and today will be the last day. Despite, patient continues to have daytime sleepiness, generalized weakness and significant decreased oral intake for 7 days.  "  On presentation patient was febrile at 100, HR 120, EKG A-fib with RVR, softer blood pressure at 97/73, lactic acid 2.8, no leukocytosis. Also found to have pressure injury, stage II on sacral and a shallow ulcer on left lateral ankle.   Patient initially received broad-spectrum antibiotic.  Per SNF record, urine cultures from 12/11/2022 showed  Morganella MDR, sensitive to cefepime, imipenem.  Patient was also on multiple sedating medications which were cut back.    Urine culture results in this hospitalization grew Enterococcus faecium as well as Candida albicans.  Patient's mental status improved with treatment. He is now being discharged to complete a course of antibiotics as well as antifungal therapy.           Consultants: Infectious disease Procedures performed: None Disposition: Home Diet recommendation:  Cardiac and Carb modified diet DISCHARGE MEDICATION: Allergies as of 12/24/2022       Reactions   Codeine Other (See Comments)   Headache   Morphine And Codeine Other (See Comments)   Makes him feel weird   Revlimid [lenalidomide] Other (See Comments)   "Causes me to become weak"        Medication List     STOP taking these medications    acyclovir 400 MG tablet Commonly known as: ZOVIRAX       TAKE these medications    acetaminophen 500 MG tablet Commonly known as: TYLENOL Take 500-1,000 mg by mouth every 6 (six) hours as needed for mild pain or moderate pain.   ALPRAZolam 0.25 MG tablet Commonly known as: XANAX Take 1 tablet (0.25 mg total) by mouth at bedtime. What changed:  medication strength how much to take   amoxicillin 500 MG capsule Commonly known as: AMOXIL Take 1 capsule (500 mg total) by mouth 3 (three) times daily for 5 days.   CALCIUM 500 PO Take 750 mg by mouth every 12 (twelve) hours.   cyanocobalamin 500 MCG tablet Commonly known as: VITAMIN B12 Take 1 tablet (500 mcg total) by mouth daily.   docusate sodium 100 MG capsule Commonly known as: COLACE Take 1 capsule (100 mg total) by mouth 2 (two) times daily.  Eliquis 5 MG Tabs tablet Generic drug: apixaban TAKE ONE TABLET BY MOUTH TWICE DAILY   feeding supplement Liqd Take 237 mLs by mouth 3 (three) times daily between meals.   ferrous sulfate 325 (65 FE) MG tablet Take 325 mg by mouth daily.   fluconazole 200 MG tablet Commonly known as: DIFLUCAN Take 1 tablet (200 mg total) by mouth daily for 10 days. Start taking on:  December 25, 2022   folic acid 1 MG tablet Commonly known as: FOLVITE TAKE ONE TABLET (1 MG TOTAL) BY MOUTH DAILY.   furosemide 20 MG tablet Commonly known as: LASIX Take 1 tablet (20 mg total) by mouth daily.   gabapentin 400 MG capsule Commonly known as: NEURONTIN Take 1 capsule (400 mg total) by mouth 2 (two) times daily.   guaiFENesin-dextromethorphan 100-10 MG/5ML syrup Commonly known as: ROBITUSSIN DM Take 5 mLs by mouth every 4 (four) hours as needed for cough (chest congestion).   Melatonin 10 MG Tabs Take 10 mg by mouth at bedtime.   metoprolol tartrate 25 MG tablet Commonly known as: LOPRESSOR Take 0.5 tablets (12.5 mg total) by mouth 2 (two) times daily.   montelukast 10 MG tablet Commonly known as: SINGULAIR Take 1 tablet (10 mg total) by mouth at bedtime.   Oxycodone HCl 10 MG Tabs Take 1 tablet (10 mg total) by mouth every 6 (six) hours as needed. What changed: Another medication with the same name was removed. Continue taking this medication, and follow the directions you see here.   potassium chloride 10 MEQ tablet Commonly known as: KLOR-CON M Take 1 tablet (10 mEq total) by mouth daily.   tamsulosin 0.4 MG Caps capsule Commonly known as: FLOMAX TAKE ONE CAPSULE BY MOUTH EVERY NIGHT ATBEDTIME   traZODone 100 MG tablet Commonly known as: DESYREL Take 1 tablet (100 mg total) by mouth at bedtime. What changed:  medication strength how much to take        Contact information for after-discharge care     Destination     [facility] .   Service: Skilled Nursing Contact information: [address] [place] [address] [phone]                    Discharge Exam: [name] Weights   12/19/22 1137  Weight: 86.6 kg   Physical Exam: General.  Frail elderly man, in no acute distress. Pulmonary.  Lungs clear bilaterally, normal respiratory effort. CV.  Regular rate and rhythm, no JVD, rub or  murmur. Abdomen.  Soft, nontender, nondistended, BS positive. CNS.  Alert and oriented .  No focal neurologic deficit. Extremities.  No edema, no cyanosis, pulses intact and symmetrical.    Condition at discharge: good  The results of significant diagnostics from this hospitalization (including imaging, microbiology, ancillary and laboratory) are listed below for reference.   Imaging Studies: DG Chest 1 View  Result Date: 12/20/2022 CLINICAL DATA:  CHF EXAM: CHEST  1 VIEW COMPARISON:  Yesterday FINDINGS: Normal heart size for rotated technique. Stable mediastinal contours. Chronic interstitial coarsening diffusely. Band of scar-like density in the right mid lung. Chronic hazy density in the retrocardiac lung, seen on multiple studies. There is no edema, significant effusion, or pneumothorax. IMPRESSION: Chronic lung disease without convincing change from recent priors to suggest acute process. Electronically Signed   By: Tiburcio Pea M.D.   On: 12/20/2022 05:42   DG Chest Port 1 View  Result Date: 12/19/2022 CLINICAL DATA:  Sepsis EXAM: PORTABLE  CHEST 1 VIEW COMPARISON:  11/23/2022 FINDINGS: Mild cardiomegaly. Aortic atherosclerosis. Streaky retrocardiac opacity. No pleural effusion or pneumothorax. Chronic bilateral rib fractures and left clavicular fracture. IMPRESSION: Streaky retrocardiac opacity, atelectasis versus pneumonia. Electronically Signed   By: Duanne Guess D.O.   On: 12/19/2022 15:56   DG Swallowing Func-Speech Pathology  Result Date: 11/26/2022 Table formatting from the original result was not included. Modified Barium Swallow Study Patient Details Name: Jaiyden Emig. MRN: [id number] Date of Birth: Oct 09, 1935 Today's Date: 11/26/2022 HPI/PMH: Pt  is a 87 year old male with history of multiple myeloma, end-stage, hypertension, BPH, insomnia, paroxysmal atrial fibrillation, history of PE, on Eliquis, who presents to the emergency department for chief concerns of  weakness for 1 week+ and has been more sedentary.  he was recently being treated for pneumonia.  Per ED note: "Pt is a cancer pt and lives with his daughter. Pt has been sick and taking a z-pack for the last week but is not feeling better. Pt has strong smell of urine per EMS. EMS reports pt is AMS and weak. Pt gets cancer treatment here but has stopped chemo and radiating because he is end stage.".  CT head wo contrast: Read as no acute intracranial abnormalities.  Moderate cerebral and cerebellar atrophy with chronic microvascular ischemic changes in the cerebral white matter.  Numerous osseous lesions again noted in the skull compatible with history of multiple myeloma.   CXR: New left retrocardiac opacity, may be due to atelectasis,  infection or aspiration.  2. Small bilateral pleural effusions. Clinical Impression Patient presents with functional oropharyngeal swallowing for age. No aspiration nor laryngeal penetration occurred during this study. Pt required some setup support d/t general overall weakness. Oral stage is characterized by adequate lip closure, bolus preparation and containment, and anterior to posterior transit. Full oral clearing achieved b/t boluses. Min extra time taken for mastication of increased texture appearing d/t Missing Molars/Dentition. Swallow initiation occurs spilling to/half filling the pyriform sinuses w/ thin liquids; the remaining bolus consistencies at/around the valleculae.  Pharyngeal stage is noted for functional tongue base retraction, adequate hyolaryngeal excursion, and adequate pharyngeal constriction. No significant pharyngeal residue remained post swallow. Epiglottic deflection is complete; there was No laryngeal penetration nor aspiration noted during this study. Pharyngeal stripping wave is complete. Amplitude/duration of cricopharyngeus opening is WFL. There is adequate/complete clearance through the viewable cervical esophagus. A 13 mm barium tablet was given in  Puree(for safer swallowing) which cleared to oropharynx appropriately. Consistencies tested were thin liquids x2 tsps, 3 cup sips, no sequential sips for general precaution, nectar x1 tsp, 1 cup sip. honey x1 tsp, pudding x1 tsp, regular solid 2 (1/2 graham cracker with pudding), and 13 mm barium tablet with Puree.  Recommend patient continue mech soft diet with thin liquids as orderd at BSE w/ general aspiration precautions including No straw, Single sips/bites, Small bites/sips; general aspiration precautions. Education provided to pt verbally and via reviewing the video together. No further ST services indicated. Factors that may increase risk of adverse event in presence of aspiration [employer] 2021): Poor general health and/or compromised immunity;Frail or deconditioned (Cancer) Swallow Evaluation Recommendations Recommendations: PO diet PO Diet Recommendation: Dysphagia 3 (Mechanical soft);Thin liquids (Level 0) Liquid Administration via: Cup; NO STRAW USE when drinking Medication Administration: Whole Meds with PUREE Supervision: Patient able to self-feed;Intermittent supervision/cueing for swallowing strategies;Set-up assistance for safety Swallowing strategies: Minimize environmental distractions;Slow rate;Small bites/sips;Follow solids with liquids Postural changes: Position pt fully upright for meals;Stay upright 30-60 min  after meals;Out of bed for meals Oral care recommendations: Oral care BID (2x/day);Pt independent with oral care (setup support) Recommended consults: Consider dietitian consultation;Consider Palliative care for support Treatment Plan Treatment Plan Treatment recommendations: No treatment recommended at this time Follow-up recommendations: No SLP follow up Recommendations Comment: general aspiration precautions Functional status assessment: Patient has had a recent decline in their functional status and demonstrates the ability to make significant improvements in function in a  reasonable and predictable amount of time. Treatment frequency: -- (n/a) Treatment duration: -- (n/a) Interventions: Aspiration precaution training; Patient/family education Recommendations Recommendations for follow up therapy are one component of a multi-disciplinary discharge planning process, led by the attending physician.  Recommendations may be updated based on patient status, additional functional criteria and insurance authorization. Assessment: Orofacial Exam: Orofacial Exam Oral Cavity: Oral Hygiene: WFL Oral Cavity - Dentition: Missing dentition Orofacial Anatomy: WFL Oral Motor/Sensory Function: WFL Anatomy: Anatomy: WFL Boluses Administered: Boluses Administered Boluses Administered: Thin liquids (Level 0); Mildly thick liquids (Level 2, nectar thick); Moderately thick liquids (Level 3, honey thick); Puree; Solid (Tablet in puree)  Oral Impairment Domain: Oral Impairment Domain Lip Closure: No labial escape Tongue control during bolus hold: Cohesive bolus between tongue to palatal seal Bolus preparation/mastication: Timely and efficient chewing and mashing (in setting of missing molars) Bolus transport/lingual motion: Brisk tongue motion Oral residue: Complete oral clearance Location of oral residue : N/A Initiation of pharyngeal swallow : Valleculae; Posterior laryngeal surface of the epiglottis; Pyriform sinuses (spilling to/half filling the pyriform sinuses w/ thin liquids; the remaining boluses at/around the valleculae)  Pharyngeal Impairment Domain: Pharyngeal Impairment Domain Soft palate elevation: No bolus between soft palate (SP)/pharyngeal wall (PW) Laryngeal elevation: Complete superior movement of thyroid cartilage with complete approximation of arytenoids to epiglottic petiole Anterior hyoid excursion: Complete anterior movement Epiglottic movement: Complete inversion Laryngeal vestibule closure: Complete, no air/contrast in laryngeal vestibule Pharyngeal stripping wave : Present -  complete Pharyngeal contraction (A/P view only): N/A Pharyngoesophageal segment opening: Complete distension and complete duration, no obstruction of flow Tongue base retraction: No contrast between tongue base and posterior pharyngeal wall (PPW) Pharyngeal residue: Complete pharyngeal clearance Location of pharyngeal residue: N/A  Esophageal Impairment Domain: Esophageal Impairment Domain Esophageal clearance upright position: Complete clearance, esophageal coating (viewable) Pill: Pill Consistency administered: Puree Puree: WFL Penetration/Aspiration Scale Score: Penetration/Aspiration Scale Score 1.  Material does not enter airway: Thin liquids (Level 0); Mildly thick liquids (Level 2, nectar thick); Moderately thick liquids (Level 3, honey thick); Puree; Solid; Pill Compensatory Strategies: Compensatory Strategies Compensatory strategies: No (Cup Drinking)   General Information: Caregiver present: No  Diet Prior to this Study: Dysphagia 3 (mechanical soft); Thin liquids (Level 0) (this admit)   Temperature : Normal   Respiratory Status: WFL   Supplemental O2: None (Room air)   History of Recent Intubation: No  Behavior/Cognition: Alert; Cooperative; Pleasant mood; Requires cueing (min) Self-Feeding Abilities: Able to self-feed; Needs set-up for self-feeding Baseline vocal quality/speech: Normal Volitional Cough: Able to elicit Volitional Swallow: Able to elicit Exam Limitations: No limitations (generalized weakness) Goal Planning: Prognosis for improved oropharyngeal function: Fair (-Good) Barriers to Reach Goals: Time post onset; Severity of deficits Barriers/Prognosis Comment: weakness from Cancer Patient/Family Stated Goal: to feel better Consulted and agree with results and recommendations: Patient; Physician; Nurse Pain: Pain Assessment Pain Assessment: No/denies pain Pain Score: -- ("moderate") Faces Pain Scale: 2 Pain Location: R shoulder radiating towards R hand, aching pain Pain Descriptors /  Indicators: Aching Pain Intervention(s): Limited activity within patient's  tolerance; Monitored during session End of Session: Start Time:SLP Start Time (ACUTE ONLY): 0755 Stop Time: SLP Stop Time (ACUTE ONLY): 0905 Time Calculation:SLP Time Calculation (min) (ACUTE ONLY): 70 min Charges: SLP Evaluations $ SLP Speech Visit: 1 Visit SLP Evaluations $MBS Swallow: 1 Procedure SLP visit diagnosis: SLP Visit Diagnosis: Dysphagia, unspecified (R13.10) Past Medical History: Past Medical History: Diagnosis Date  BPH (benign prostatic hyperplasia)   Colonic diverticular abscess 01/08/2015  Diverticulitis 12/22/2014  complicated by abscess and required percutaneous drainage  GERD (gastroesophageal reflux disease)   H/O ETOH abuse   History of chemotherapy last done jan 2017  History of radiation therapy 01/05/13-02/10/13  45 gray to left occipital condyle region  History of radiation therapy 12/01/16-12/10/16  Parasternal nodule, chest- 24 Gy total delivered in 8 fractions, Left sacro-iliac, pelvis- 24 Gy total delivered in 8 fractions   History of radiation therapy 02/19/17-02/22/17  right temporal scalp 30 Gy in 10 fractions  History of radiation therapy   Right hip( Pelvis) 11/18/21-11/21/21-Dr. Antony Blackbird  Intra-abdominal abscess (HCC)   Multiple myeloma (HCC) 2015  Neuropathy   Radiation 02/21/14-03/08/14  right posterior chest wall area 30 gray  Radiation 04/19/14-05/02/14  lumbar spine 25 gray  Skull lesion   Left occipital condyle  Wrist fracture, left   x 2 Past Surgical History: Past Surgical History: Procedure Laterality Date  COLONOSCOPY N/A 04/19/2015  Procedure: COLONOSCOPY;  Surgeon: West Bali, MD;  Location: [facility];  Service: Endoscopy;  Laterality: N/A;  1:30 PM  CYST EXCISION  1959  tail bone  CYSTOSCOPY WITH FULGERATION N/A 08/20/2022  Procedure: CYSTOSCOPY WITH FULGERATION;  Surgeon: Crista Elliot, MD;  Location: [facility];  Service: Urology;  Laterality: N/A;  cysto clot evac  IR IMAGING GUIDED PORT  INSERTION  09/25/2017  IR REMOVAL TUN ACCESS W/ PORT W/O FL MOD SED  10/21/2022 Watson,Katherine 11/26/2022, 11:17 AM   Microbiology: Results for orders placed or performed during the hospital encounter of 12/19/22  Resp panel by RT-PCR (RSV, Flu A&B, Covid) Anterior Nasal Swab     Status: None   Collection Time: 12/19/22 11:50 AM   Specimen: Anterior Nasal Swab  Result Value Ref Range Status   SARS Coronavirus 2 by RT PCR NEGATIVE NEGATIVE Final    Comment: (NOTE) SARS-CoV-2 target nucleic acids are NOT DETECTED.  The SARS-CoV-2 RNA is generally detectable in upper respiratory specimens during the acute phase of infection. The lowest concentration of SARS-CoV-2 viral copies this assay can detect is 138 copies/mL. A negative result does not preclude SARS-Cov-2 infection and should not be used as the sole basis for treatment or other patient management decisions. A negative result may occur with  improper specimen collection/handling, submission of specimen other than nasopharyngeal swab, presence of viral mutation(s) within the areas targeted by this assay, and inadequate number of viral copies(<138 copies/mL). A negative result must be combined with clinical observations, patient history, and epidemiological information. The expected result is Negative.  Fact Sheet for Patients:  BloggerCourse.com  Fact Sheet for Healthcare Providers:  SeriousBroker.it  This test is no t yet approved or cleared by the [place] FDA and  has been authorized for detection and/or diagnosis of SARS-CoV-2 by FDA under an Emergency Use Authorization (EUA). This EUA will remain  in effect (meaning this test can be used) for the duration of the COVID-19 declaration under Section 564(b)(1) of the Act, 21 U.S.C.section 360bbb-3(b)(1), unless the authorization is terminated  or revoked sooner.  Influenza A by PCR NEGATIVE NEGATIVE Final    Influenza B by PCR NEGATIVE NEGATIVE Final    Comment: (NOTE) The Xpert Xpress SARS-CoV-2/FLU/RSV plus assay is intended as an aid in the diagnosis of influenza from Nasopharyngeal swab specimens and should not be used as a sole basis for treatment. Nasal washings and aspirates are unacceptable for Xpert Xpress SARS-CoV-2/FLU/RSV testing.  Fact Sheet for Patients: BloggerCourse.com  Fact Sheet for Healthcare Providers: SeriousBroker.it  This test is not yet approved or cleared by the [place] FDA and has been authorized for detection and/or diagnosis of SARS-CoV-2 by FDA under an Emergency Use Authorization (EUA). This EUA will remain in effect (meaning this test can be used) for the duration of the COVID-19 declaration under Section 564(b)(1) of the Act, 21 U.S.C. section 360bbb-3(b)(1), unless the authorization is terminated or revoked.     Resp Syncytial Virus by PCR NEGATIVE NEGATIVE Final    Comment: (NOTE) Fact Sheet for Patients: BloggerCourse.com  Fact Sheet for Healthcare Providers: SeriousBroker.it  This test is not yet approved or cleared by the [place] FDA and has been authorized for detection and/or diagnosis of SARS-CoV-2 by FDA under an Emergency Use Authorization (EUA). This EUA will remain in effect (meaning this test can be used) for the duration of the COVID-19 declaration under Section 564(b)(1) of the Act, 21 U.S.C. section 360bbb-3(b)(1), unless the authorization is terminated or revoked.  Performed at [facility], [address] [place], [place] [address]   Blood Culture (routine x 2)     Status: None   Collection Time: 12/19/22 11:50 AM   Specimen: BLOOD  Result Value Ref Range Status   Specimen Description BLOOD BLOOD RIGHT HAND  Final   Special Requests   Final    BOTTLES DRAWN AEROBIC AND ANAEROBIC Blood Culture adequate  volume   Culture   Final    NO GROWTH 5 DAYS Performed at [facility], [address] [place], [place] [address]    Report Status 12/24/2022 FINAL  Final  Blood Culture (routine x 2)     Status: None   Collection Time: 12/19/22 11:50 AM   Specimen: BLOOD  Result Value Ref Range Status   Specimen Description BLOOD BLOOD LEFT ARM  Final   Special Requests   Final    BOTTLES DRAWN AEROBIC AND ANAEROBIC Blood Culture results may not be optimal due to an excessive volume of blood received in culture bottles   Culture   Final    NO GROWTH 5 DAYS Performed at [facility], [address], [place], [place] [address]    Report Status 12/24/2022 FINAL  Final  Urine Culture (for pregnant, neutropenic or urologic patients or patients with an indwelling urinary catheter)     Status: Abnormal (Preliminary result)   Collection Time: 12/20/22 10:36 AM   Specimen: Urine, Clean Catch  Result Value Ref Range Status   Specimen Description   Final    URINE, CLEAN CATCH Performed at [facility], [address], [place], [place] [address]    Special Requests   Final    NONE Performed at [facility], [address] [place], [place] [address]    Culture (A)  Final    >=100,000 COLONIES/mL ENTEROCOCCUS FAECIUM REPEATING SUSCEPTIBILITIES >=100,000 COLONIES/mL CANDIDA ALBICANS    Report Status PENDING  Incomplete   *Note: Due to a large number of results and/or encounters for the requested time period, some results have not been displayed. A complete  set of results can be found in Results Review.    Labs: CBC: Recent Labs  Lab 12/19/22 1150 12/20/22 0325 12/23/22 0357 12/24/22 0501  WBC 6.8 5.2 5.5 5.8  NEUTROABS 3.9  --  3.2 3.1  HGB 10.6* 8.9* 9.1* 8.6*  HCT 35.0* 29.3* 29.0* 27.2*  MCV 96.7 96.4 95.1 94.1  PLT 197 146* 178 184   Basic Metabolic Panel: Recent Labs  Lab 12/19/22 1150 12/20/22 0325 12/23/22 0357 12/24/22 0501  NA 138 140  139 139  K 3.5 3.0* 3.3* 4.2  CL 103 106 107 112*  CO2 26 28 26 26   GLUCOSE 97 113* 97 98  BUN 17 21 28* 28*  CREATININE 0.83 0.62 0.58* 0.51*  CALCIUM 8.7* 9.1 8.8* 9.1   Liver Function Tests: Recent Labs  Lab 12/19/22 1150  AST 20  ALT 6  ALKPHOS 23*  BILITOT 0.7  PROT 8.3*  ALBUMIN 2.1*   CBG: No results for input(s): "GLUCAP" in the last 168 hours.  Discharge time spent:  38 minutes.  Signed: Loyce Dys, MD [facility] 12/24/2022

## 2022-12-24 NOTE — Plan of Care (Signed)
Report called to [facility] @ [place] [facility].  Problem: Education: Goal: Knowledge of General Education information will improve Description: Including pain rating scale, medication(s)/side effects and non-pharmacologic comfort measures Outcome: Progressing   Problem: Health Behavior/Discharge Planning: Goal: Ability to manage health-related needs will improve Outcome: Progressing   Problem: Clinical Measurements: Goal: Ability to maintain clinical measurements within normal limits will improve Outcome: Progressing Goal: Will remain free from infection Outcome: Progressing Goal: Diagnostic test results will improve Outcome: Progressing Goal: Respiratory complications will improve Outcome: Progressing Goal: Cardiovascular complication will be avoided Outcome: Progressing   Problem: Activity: Goal: Risk for activity intolerance will decrease Outcome: Progressing   Problem: Nutrition: Goal: Adequate nutrition will be maintained Outcome: Progressing   Problem: Coping: Goal: Level of anxiety will decrease Outcome: Progressing   Problem: Elimination: Goal: Will not experience complications related to bowel motility Outcome: Progressing Goal: Will not experience complications related to urinary retention Outcome: Progressing   Problem: Pain Management: Goal: General experience of comfort will improve Outcome: Progressing   Problem: Safety: Goal: Ability to remain free from injury will improve Outcome: Progressing   Problem: Skin Integrity: Goal: Risk for impaired skin integrity will decrease Outcome: Progressing

## 2022-12-24 NOTE — Progress Notes (Signed)
Mobility Specialist - Progress Note   12/24/22 1100  Mobility  Activity Refused mobility     Pt lying in bed upon arrival, utilizing RA. Pt initially agreeable to activity, but then declined just prior to EOB attempt. Pt reporting tiredness from lack of sleep and requesting to rest at this time. Pt anticipating d/c later this date. Pt left in bed with needs in reach, family at bedside.    Filiberto Pinks Mobility Specialist 12/24/22, 11:14 AM

## 2022-12-24 NOTE — TOC Transition Note (Signed)
Transition of Care [facility]) - CM/SW Discharge Note   Patient Details  Name: Dean [name]. MRN: [id number] Date of Birth: 02-10-1935  Transition of [facility]) CM/SW Contact:  Chapman Fitch, RN Phone Number: 12/24/2022, 1:54 PM   Clinical Narrative:      Patient will DC [id number] Anticipated DC date: 12/24/22  Family notified: Daughter [name]  Transport by: Wendie Simmer  Per MD patient ready for DC to . RN, patient's family, and facility notified of DC. Discharge Summary sent to facility. RN given number for report. DC packet on chart. Ambulance transport requested for patient.   MD confirms controlled substance scripts are signed for discharge  TOC signing off.        Patient Goals and CMS Choice      Discharge Placement                         Discharge Plan and Services Additional resources added to the After Visit Summary for                                       Social Determinants of Health (SDOH) Interventions SDOH Screenings   Food Insecurity: No Food Insecurity (12/19/2022)  Housing: Low Risk  (12/19/2022)  Transportation Needs: No Transportation Needs (12/19/2022)  Utilities: Not At Risk (12/19/2022)  Alcohol Screen: Low Risk  (01/09/2022)  Depression (PHQ2-9): Low Risk  (09/16/2022)  Financial Resource Strain: Low Risk  (03/21/2022)  Physical Activity: Inactive (01/09/2022)  Social Connections: Moderately Isolated (01/09/2022)  Stress: Stress Concern Present (01/09/2022)  Tobacco Use: Low Risk  (12/19/2022)     Readmission Risk Interventions    11/24/2022    4:10 PM 08/20/2022    1:21 PM  Readmission Risk Prevention Plan  Transportation Screening Complete Complete  PCP or Specialist Appt within 3-5 Days  Complete  HRI or Home Care Consult  Complete  Social Work Consult for Recovery Care Planning/Counseling  Complete  Palliative Care Screening  Not Applicable  Medication Review Oceanographer) Complete Complete   HRI or Home Care Consult Complete   SW Recovery Care/Counseling Consult Complete   Palliative Care Screening Not Applicable   Skilled Nursing Facility Not Applicable

## 2022-12-25 DIAGNOSIS — L89616 Pressure-induced deep tissue damage of right heel: Secondary | ICD-10-CM | POA: Diagnosis not present

## 2022-12-25 DIAGNOSIS — C9000 Multiple myeloma not having achieved remission: Secondary | ICD-10-CM | POA: Diagnosis not present

## 2022-12-25 DIAGNOSIS — L89154 Pressure ulcer of sacral region, stage 4: Secondary | ICD-10-CM | POA: Diagnosis not present

## 2022-12-25 DIAGNOSIS — L89520 Pressure ulcer of left ankle, unstageable: Secondary | ICD-10-CM | POA: Diagnosis not present

## 2022-12-26 DIAGNOSIS — K5909 Other constipation: Secondary | ICD-10-CM | POA: Diagnosis not present

## 2022-12-26 DIAGNOSIS — N400 Enlarged prostate without lower urinary tract symptoms: Secondary | ICD-10-CM | POA: Diagnosis not present

## 2022-12-26 DIAGNOSIS — I5033 Acute on chronic diastolic (congestive) heart failure: Secondary | ICD-10-CM | POA: Diagnosis not present

## 2022-12-26 DIAGNOSIS — R52 Pain, unspecified: Secondary | ICD-10-CM | POA: Diagnosis not present

## 2022-12-26 DIAGNOSIS — R059 Cough, unspecified: Secondary | ICD-10-CM | POA: Diagnosis not present

## 2022-12-26 DIAGNOSIS — R627 Adult failure to thrive: Secondary | ICD-10-CM | POA: Diagnosis not present

## 2022-12-26 DIAGNOSIS — G629 Polyneuropathy, unspecified: Secondary | ICD-10-CM | POA: Diagnosis not present

## 2022-12-26 DIAGNOSIS — G4700 Insomnia, unspecified: Secondary | ICD-10-CM | POA: Diagnosis not present

## 2022-12-26 DIAGNOSIS — F419 Anxiety disorder, unspecified: Secondary | ICD-10-CM | POA: Diagnosis not present

## 2022-12-26 DIAGNOSIS — N390 Urinary tract infection, site not specified: Secondary | ICD-10-CM | POA: Diagnosis not present

## 2022-12-26 DIAGNOSIS — Z86711 Personal history of pulmonary embolism: Secondary | ICD-10-CM | POA: Diagnosis not present

## 2022-12-26 NOTE — Consult Note (Signed)
[facility] Liaison Note  12/26/2022  Dean [name] 30-Oct-1935 [id number]  Location: RN Hospital Liaison screened the patient remotely at [facility].  Insurance: Medicare   Dean [name]. is a 87 y.o. male who is a Primary Care Patient of Donita Brooks, MD The patient was screened for 30 day readmission hospitalization with noted extreme risk score for unplanned readmission risk with 4 IP in 6 months.  The patient was assessed for potential Care Management service needs for post hospital transition for care coordination. Review of patient's electronic medical record reveals patient was admitted with Sepsis (Altered mental status). Pt discharged to SNF level of care. Facility will continue to address pt's ongoing needs.   [facility] Care Management/Population Health does not replace or interfere with any arrangements made by the Inpatient Transition of Care team.   For questions contact:   Elliot Cousin, RN, [facility] Liaison [place]   [facility], Population Health Office Hours MTWF  8:00 am-6:00 pm Direct Dial: [phone] mobile [phone] [Office toll free line] Office Hours are M-F 8:30 - 5 pm [name]

## 2022-12-30 DIAGNOSIS — S41111D Laceration without foreign body of right upper arm, subsequent encounter: Secondary | ICD-10-CM | POA: Diagnosis not present

## 2022-12-30 DIAGNOSIS — S51021A Laceration with foreign body of right elbow, initial encounter: Secondary | ICD-10-CM | POA: Diagnosis not present

## 2022-12-30 DIAGNOSIS — R52 Pain, unspecified: Secondary | ICD-10-CM | POA: Diagnosis not present

## 2022-12-30 DIAGNOSIS — L89520 Pressure ulcer of left ankle, unstageable: Secondary | ICD-10-CM | POA: Diagnosis not present

## 2022-12-30 DIAGNOSIS — G629 Polyneuropathy, unspecified: Secondary | ICD-10-CM | POA: Diagnosis not present

## 2022-12-30 DIAGNOSIS — L89616 Pressure-induced deep tissue damage of right heel: Secondary | ICD-10-CM | POA: Diagnosis not present

## 2022-12-30 DIAGNOSIS — L89150 Pressure ulcer of sacral region, unstageable: Secondary | ICD-10-CM | POA: Diagnosis not present

## 2022-12-30 DIAGNOSIS — R627 Adult failure to thrive: Secondary | ICD-10-CM | POA: Diagnosis not present

## 2022-12-31 DIAGNOSIS — I5033 Acute on chronic diastolic (congestive) heart failure: Secondary | ICD-10-CM | POA: Diagnosis not present

## 2022-12-31 DIAGNOSIS — N390 Urinary tract infection, site not specified: Secondary | ICD-10-CM | POA: Diagnosis not present

## 2022-12-31 DIAGNOSIS — C9000 Multiple myeloma not having achieved remission: Secondary | ICD-10-CM | POA: Diagnosis not present

## 2022-12-31 DIAGNOSIS — B952 Enterococcus as the cause of diseases classified elsewhere: Secondary | ICD-10-CM | POA: Diagnosis not present

## 2022-12-31 DIAGNOSIS — R627 Adult failure to thrive: Secondary | ICD-10-CM | POA: Diagnosis not present

## 2022-12-31 DIAGNOSIS — I4891 Unspecified atrial fibrillation: Secondary | ICD-10-CM | POA: Diagnosis not present

## 2023-01-01 LAB — SUSCEPTIBILITY, AER + ANAEROB: Source of Sample: 8680

## 2023-01-01 LAB — URINE CULTURE: Culture: >=100000 — AB

## 2023-01-01 LAB — SUSCEPTIBILITY RESULT

## 2023-01-02 DIAGNOSIS — R52 Pain, unspecified: Secondary | ICD-10-CM | POA: Diagnosis not present

## 2023-01-02 DIAGNOSIS — K5909 Other constipation: Secondary | ICD-10-CM | POA: Diagnosis not present

## 2023-01-05 DIAGNOSIS — G9341 Metabolic encephalopathy: Secondary | ICD-10-CM | POA: Diagnosis not present

## 2023-01-07 DIAGNOSIS — G9341 Metabolic encephalopathy: Secondary | ICD-10-CM | POA: Diagnosis not present

## 2023-01-08 DIAGNOSIS — C9000 Multiple myeloma not having achieved remission: Secondary | ICD-10-CM | POA: Diagnosis not present

## 2023-01-08 DIAGNOSIS — L89520 Pressure ulcer of left ankle, unstageable: Secondary | ICD-10-CM | POA: Diagnosis not present

## 2023-01-08 DIAGNOSIS — L89616 Pressure-induced deep tissue damage of right heel: Secondary | ICD-10-CM | POA: Diagnosis not present

## 2023-01-08 DIAGNOSIS — L89154 Pressure ulcer of sacral region, stage 4: Secondary | ICD-10-CM | POA: Diagnosis not present

## 2023-01-14 DIAGNOSIS — Z515 Encounter for palliative care: Secondary | ICD-10-CM | POA: Diagnosis not present

## 2023-01-15 DIAGNOSIS — L89154 Pressure ulcer of sacral region, stage 4: Secondary | ICD-10-CM | POA: Diagnosis not present

## 2023-01-15 DIAGNOSIS — L89616 Pressure-induced deep tissue damage of right heel: Secondary | ICD-10-CM | POA: Diagnosis not present

## 2023-01-15 DIAGNOSIS — S51012A Laceration without foreign body of left elbow, initial encounter: Secondary | ICD-10-CM | POA: Diagnosis not present

## 2023-01-15 DIAGNOSIS — C9000 Multiple myeloma not having achieved remission: Secondary | ICD-10-CM | POA: Diagnosis not present

## 2023-01-15 DIAGNOSIS — L89520 Pressure ulcer of left ankle, unstageable: Secondary | ICD-10-CM | POA: Diagnosis not present

## 2023-01-19 ENCOUNTER — Telehealth: Payer: Self-pay | Admitting: *Deleted

## 2023-01-19 DIAGNOSIS — C9000 Multiple myeloma not having achieved remission: Secondary | ICD-10-CM

## 2023-01-19 DIAGNOSIS — G9341 Metabolic encephalopathy: Secondary | ICD-10-CM | POA: Diagnosis not present

## 2023-01-19 NOTE — Telephone Encounter (Signed)
Spoke with patient's daughter by phone.  She says that patient is not doing well and is overall declining.  She plans to take patient home with hospice and focus on keeping him home and comfortable.    Referral for hospice sent.

## 2023-01-19 NOTE — Telephone Encounter (Signed)
Daughter would like a  return call to discuss possible Hospice for this patient

## 2023-01-22 DIAGNOSIS — R52 Pain, unspecified: Secondary | ICD-10-CM | POA: Diagnosis not present

## 2023-01-22 DIAGNOSIS — K5909 Other constipation: Secondary | ICD-10-CM | POA: Diagnosis not present

## 2023-01-22 DIAGNOSIS — G9341 Metabolic encephalopathy: Secondary | ICD-10-CM | POA: Diagnosis not present

## 2023-01-23 ENCOUNTER — Telehealth: Payer: Self-pay | Admitting: *Deleted

## 2023-01-23 ENCOUNTER — Other Ambulatory Visit: Payer: Self-pay | Admitting: Oncology

## 2023-01-23 ENCOUNTER — Inpatient Hospital Stay: Payer: Medicare Other | Attending: Oncology | Admitting: Hospice and Palliative Medicine

## 2023-01-23 DIAGNOSIS — G629 Polyneuropathy, unspecified: Secondary | ICD-10-CM | POA: Diagnosis not present

## 2023-01-23 DIAGNOSIS — L89159 Pressure ulcer of sacral region, unspecified stage: Secondary | ICD-10-CM | POA: Diagnosis not present

## 2023-01-23 DIAGNOSIS — I482 Chronic atrial fibrillation, unspecified: Secondary | ICD-10-CM | POA: Diagnosis not present

## 2023-01-23 DIAGNOSIS — S32030A Wedge compression fracture of third lumbar vertebra, initial encounter for closed fracture: Secondary | ICD-10-CM | POA: Diagnosis not present

## 2023-01-23 DIAGNOSIS — I5033 Acute on chronic diastolic (congestive) heart failure: Secondary | ICD-10-CM | POA: Diagnosis not present

## 2023-01-23 DIAGNOSIS — L89609 Pressure ulcer of unspecified heel, unspecified stage: Secondary | ICD-10-CM | POA: Diagnosis not present

## 2023-01-23 DIAGNOSIS — K219 Gastro-esophageal reflux disease without esophagitis: Secondary | ICD-10-CM | POA: Diagnosis not present

## 2023-01-23 DIAGNOSIS — G4700 Insomnia, unspecified: Secondary | ICD-10-CM | POA: Diagnosis not present

## 2023-01-23 DIAGNOSIS — G893 Neoplasm related pain (acute) (chronic): Secondary | ICD-10-CM | POA: Diagnosis not present

## 2023-01-23 DIAGNOSIS — C9000 Multiple myeloma not having achieved remission: Secondary | ICD-10-CM

## 2023-01-23 DIAGNOSIS — R300 Dysuria: Secondary | ICD-10-CM

## 2023-01-23 DIAGNOSIS — D649 Anemia, unspecified: Secondary | ICD-10-CM | POA: Diagnosis not present

## 2023-01-23 DIAGNOSIS — N400 Enlarged prostate without lower urinary tract symptoms: Secondary | ICD-10-CM | POA: Diagnosis not present

## 2023-01-23 MED ORDER — CEPHALEXIN 500 MG PO CAPS: 500.0000 mg | ORAL_CAPSULE | Freq: Two times a day (BID) | ORAL | 0 refills | Status: DC

## 2023-01-23 NOTE — Progress Notes (Signed)
Virtual Visit via Telephone Note  I connected with Reco Iseminger. on 01/23/23 at  2:20 PM EST by telephone and verified that I am speaking with the correct person using two identifiers.  Location: Patient: Home Provider: Clinic   I discussed the limitations, risks, security and privacy concerns of performing an evaluation and management service by telephone and the availability of in person appointments. I also discussed with the patient that there may be a patient responsible charge related to this service. The patient expressed understanding and agreed to proceed.   History of Present Illness: Dean [name]. is a 87 y.o. male with multiple medical problems including multiple myeloma with disease progression most recently on treatment with Pomalyst.  Patient is felt to not have viable treatment alternatives and hospice has been discussed.  He has had multiple recent hospitalizations, last admitted on 12/19/2022 with UTI.  Palliative care was consulted to address goals. .    Observations/Objective: Patient has been at rehab for the past month.  Overall declining performance status and poor oral intake.  Daughter brought patient home and requested hospice involvement.  I spoke with daughter and hospice nurse Misty Stanley) today.  Reportedly, patient has had subjective fevers overnight with foul-smelling urine and dysuria.  He was last hospitalized in November with UTI and it appears he completed course of amoxicillin with urine culture positive for E faecium and C albicans.  Daughter confirms desire to keep patient at home and focus on comfort.  She is not interested in hospitalization or other workup/treatment.  Suggested obtaining UA/culture but daughter did not want him catheterized and declined specimen.  Assessment and Plan: Multiple myeloma -disease progression.  Agree with best supportive care/hospice  Dysuria -probable UTI.  Will treat empirically with cephalexin 500 mg twice daily  x 5 days  Follow Up Instructions: As needed   I discussed the assessment and treatment plan with the patient. The patient was provided an opportunity to ask questions and all were answered. The patient agreed with the plan and demonstrated an understanding of the instructions.   The patient was advised to call back or seek an in-person evaluation if the symptoms worsen or if the condition fails to improve as anticipated.  I provided 10 minutes of non-face-to-face time during this encounter.   Malachy Moan, NP

## 2023-01-23 NOTE — Telephone Encounter (Addendum)
Daughter called reporting that she had asked nursing home to get order for UTI for patient but they did not do it and she has removed him from there and so she is asking if we will order medicine for UTI and states that she is unable to bring patient in to be seen. She states that Hospice is supposed to be coming to open him to services later today. Please advise  He uses [facility]

## 2023-01-23 NOTE — Telephone Encounter (Signed)
I called and spoke with [facility] triage nurse and asked that they please check UA and culture when they go out today and to please call J Borders, NP while in the home to discuss. She said that she will send message to the team going out at 2 PM to open him to services

## 2023-01-24 DIAGNOSIS — C9000 Multiple myeloma not having achieved remission: Secondary | ICD-10-CM | POA: Diagnosis not present

## 2023-01-24 DIAGNOSIS — L89159 Pressure ulcer of sacral region, unspecified stage: Secondary | ICD-10-CM | POA: Diagnosis not present

## 2023-01-24 DIAGNOSIS — L89609 Pressure ulcer of unspecified heel, unspecified stage: Secondary | ICD-10-CM | POA: Diagnosis not present

## 2023-01-24 DIAGNOSIS — K219 Gastro-esophageal reflux disease without esophagitis: Secondary | ICD-10-CM | POA: Diagnosis not present

## 2023-01-24 DIAGNOSIS — G893 Neoplasm related pain (acute) (chronic): Secondary | ICD-10-CM | POA: Diagnosis not present

## 2023-01-24 DIAGNOSIS — N400 Enlarged prostate without lower urinary tract symptoms: Secondary | ICD-10-CM | POA: Diagnosis not present

## 2023-01-26 DIAGNOSIS — L89609 Pressure ulcer of unspecified heel, unspecified stage: Secondary | ICD-10-CM | POA: Diagnosis not present

## 2023-01-26 DIAGNOSIS — N400 Enlarged prostate without lower urinary tract symptoms: Secondary | ICD-10-CM | POA: Diagnosis not present

## 2023-01-26 DIAGNOSIS — G893 Neoplasm related pain (acute) (chronic): Secondary | ICD-10-CM | POA: Diagnosis not present

## 2023-01-26 DIAGNOSIS — K219 Gastro-esophageal reflux disease without esophagitis: Secondary | ICD-10-CM | POA: Diagnosis not present

## 2023-01-26 DIAGNOSIS — C9000 Multiple myeloma not having achieved remission: Secondary | ICD-10-CM | POA: Diagnosis not present

## 2023-01-26 DIAGNOSIS — L89159 Pressure ulcer of sacral region, unspecified stage: Secondary | ICD-10-CM | POA: Diagnosis not present

## 2023-01-27 DIAGNOSIS — L89159 Pressure ulcer of sacral region, unspecified stage: Secondary | ICD-10-CM | POA: Diagnosis not present

## 2023-01-27 DIAGNOSIS — N400 Enlarged prostate without lower urinary tract symptoms: Secondary | ICD-10-CM | POA: Diagnosis not present

## 2023-01-27 DIAGNOSIS — C9000 Multiple myeloma not having achieved remission: Secondary | ICD-10-CM | POA: Diagnosis not present

## 2023-01-27 DIAGNOSIS — K219 Gastro-esophageal reflux disease without esophagitis: Secondary | ICD-10-CM | POA: Diagnosis not present

## 2023-01-27 DIAGNOSIS — G893 Neoplasm related pain (acute) (chronic): Secondary | ICD-10-CM | POA: Diagnosis not present

## 2023-01-27 DIAGNOSIS — L89609 Pressure ulcer of unspecified heel, unspecified stage: Secondary | ICD-10-CM | POA: Diagnosis not present

## 2023-01-29 DIAGNOSIS — C9000 Multiple myeloma not having achieved remission: Secondary | ICD-10-CM | POA: Diagnosis not present

## 2023-01-29 DIAGNOSIS — G893 Neoplasm related pain (acute) (chronic): Secondary | ICD-10-CM | POA: Diagnosis not present

## 2023-01-29 DIAGNOSIS — N400 Enlarged prostate without lower urinary tract symptoms: Secondary | ICD-10-CM | POA: Diagnosis not present

## 2023-01-29 DIAGNOSIS — L89609 Pressure ulcer of unspecified heel, unspecified stage: Secondary | ICD-10-CM | POA: Diagnosis not present

## 2023-01-29 DIAGNOSIS — L89159 Pressure ulcer of sacral region, unspecified stage: Secondary | ICD-10-CM | POA: Diagnosis not present

## 2023-01-29 DIAGNOSIS — K219 Gastro-esophageal reflux disease without esophagitis: Secondary | ICD-10-CM | POA: Diagnosis not present

## 2023-02-02 DIAGNOSIS — G893 Neoplasm related pain (acute) (chronic): Secondary | ICD-10-CM | POA: Diagnosis not present

## 2023-02-02 DIAGNOSIS — K219 Gastro-esophageal reflux disease without esophagitis: Secondary | ICD-10-CM | POA: Diagnosis not present

## 2023-02-02 DIAGNOSIS — C9000 Multiple myeloma not having achieved remission: Secondary | ICD-10-CM | POA: Diagnosis not present

## 2023-02-02 DIAGNOSIS — N400 Enlarged prostate without lower urinary tract symptoms: Secondary | ICD-10-CM | POA: Diagnosis not present

## 2023-02-02 DIAGNOSIS — L89609 Pressure ulcer of unspecified heel, unspecified stage: Secondary | ICD-10-CM | POA: Diagnosis not present

## 2023-02-02 DIAGNOSIS — L89159 Pressure ulcer of sacral region, unspecified stage: Secondary | ICD-10-CM | POA: Diagnosis not present

## 2023-02-04 DIAGNOSIS — C9000 Multiple myeloma not having achieved remission: Secondary | ICD-10-CM | POA: Diagnosis not present

## 2023-02-04 DIAGNOSIS — I482 Chronic atrial fibrillation, unspecified: Secondary | ICD-10-CM | POA: Diagnosis not present

## 2023-02-04 DIAGNOSIS — I5033 Acute on chronic diastolic (congestive) heart failure: Secondary | ICD-10-CM | POA: Diagnosis not present

## 2023-02-04 DIAGNOSIS — G893 Neoplasm related pain (acute) (chronic): Secondary | ICD-10-CM | POA: Diagnosis not present

## 2023-02-04 DIAGNOSIS — K219 Gastro-esophageal reflux disease without esophagitis: Secondary | ICD-10-CM | POA: Diagnosis not present

## 2023-02-04 DIAGNOSIS — D649 Anemia, unspecified: Secondary | ICD-10-CM | POA: Diagnosis not present

## 2023-02-04 DIAGNOSIS — S32030A Wedge compression fracture of third lumbar vertebra, initial encounter for closed fracture: Secondary | ICD-10-CM | POA: Diagnosis not present

## 2023-02-04 DIAGNOSIS — N400 Enlarged prostate without lower urinary tract symptoms: Secondary | ICD-10-CM | POA: Diagnosis not present

## 2023-02-04 DIAGNOSIS — L89609 Pressure ulcer of unspecified heel, unspecified stage: Secondary | ICD-10-CM | POA: Diagnosis not present

## 2023-02-04 DIAGNOSIS — L89159 Pressure ulcer of sacral region, unspecified stage: Secondary | ICD-10-CM | POA: Diagnosis not present

## 2023-02-04 DIAGNOSIS — G4700 Insomnia, unspecified: Secondary | ICD-10-CM | POA: Diagnosis not present

## 2023-02-04 DIAGNOSIS — G629 Polyneuropathy, unspecified: Secondary | ICD-10-CM | POA: Diagnosis not present

## 2023-02-05 DIAGNOSIS — G893 Neoplasm related pain (acute) (chronic): Secondary | ICD-10-CM | POA: Diagnosis not present

## 2023-02-05 DIAGNOSIS — L89609 Pressure ulcer of unspecified heel, unspecified stage: Secondary | ICD-10-CM | POA: Diagnosis not present

## 2023-02-05 DIAGNOSIS — K219 Gastro-esophageal reflux disease without esophagitis: Secondary | ICD-10-CM | POA: Diagnosis not present

## 2023-02-05 DIAGNOSIS — C9000 Multiple myeloma not having achieved remission: Secondary | ICD-10-CM | POA: Diagnosis not present

## 2023-02-05 DIAGNOSIS — N400 Enlarged prostate without lower urinary tract symptoms: Secondary | ICD-10-CM | POA: Diagnosis not present

## 2023-02-05 DIAGNOSIS — L89159 Pressure ulcer of sacral region, unspecified stage: Secondary | ICD-10-CM | POA: Diagnosis not present

## 2023-02-09 DIAGNOSIS — L89159 Pressure ulcer of sacral region, unspecified stage: Secondary | ICD-10-CM | POA: Diagnosis not present

## 2023-02-09 DIAGNOSIS — L89609 Pressure ulcer of unspecified heel, unspecified stage: Secondary | ICD-10-CM | POA: Diagnosis not present

## 2023-02-09 DIAGNOSIS — K219 Gastro-esophageal reflux disease without esophagitis: Secondary | ICD-10-CM | POA: Diagnosis not present

## 2023-02-09 DIAGNOSIS — G893 Neoplasm related pain (acute) (chronic): Secondary | ICD-10-CM | POA: Diagnosis not present

## 2023-02-09 DIAGNOSIS — C9000 Multiple myeloma not having achieved remission: Secondary | ICD-10-CM | POA: Diagnosis not present

## 2023-02-09 DIAGNOSIS — N400 Enlarged prostate without lower urinary tract symptoms: Secondary | ICD-10-CM | POA: Diagnosis not present

## 2023-02-12 DIAGNOSIS — G893 Neoplasm related pain (acute) (chronic): Secondary | ICD-10-CM | POA: Diagnosis not present

## 2023-02-12 DIAGNOSIS — C9000 Multiple myeloma not having achieved remission: Secondary | ICD-10-CM | POA: Diagnosis not present

## 2023-02-12 DIAGNOSIS — L89609 Pressure ulcer of unspecified heel, unspecified stage: Secondary | ICD-10-CM | POA: Diagnosis not present

## 2023-02-12 DIAGNOSIS — K219 Gastro-esophageal reflux disease without esophagitis: Secondary | ICD-10-CM | POA: Diagnosis not present

## 2023-02-12 DIAGNOSIS — L89159 Pressure ulcer of sacral region, unspecified stage: Secondary | ICD-10-CM | POA: Diagnosis not present

## 2023-02-12 DIAGNOSIS — N400 Enlarged prostate without lower urinary tract symptoms: Secondary | ICD-10-CM | POA: Diagnosis not present

## 2023-02-13 DIAGNOSIS — G893 Neoplasm related pain (acute) (chronic): Secondary | ICD-10-CM | POA: Diagnosis not present

## 2023-02-13 DIAGNOSIS — L89609 Pressure ulcer of unspecified heel, unspecified stage: Secondary | ICD-10-CM | POA: Diagnosis not present

## 2023-02-13 DIAGNOSIS — K219 Gastro-esophageal reflux disease without esophagitis: Secondary | ICD-10-CM | POA: Diagnosis not present

## 2023-02-13 DIAGNOSIS — N400 Enlarged prostate without lower urinary tract symptoms: Secondary | ICD-10-CM | POA: Diagnosis not present

## 2023-02-13 DIAGNOSIS — L89159 Pressure ulcer of sacral region, unspecified stage: Secondary | ICD-10-CM | POA: Diagnosis not present

## 2023-02-13 DIAGNOSIS — C9000 Multiple myeloma not having achieved remission: Secondary | ICD-10-CM | POA: Diagnosis not present

## 2023-02-15 DIAGNOSIS — K219 Gastro-esophageal reflux disease without esophagitis: Secondary | ICD-10-CM | POA: Diagnosis not present

## 2023-02-15 DIAGNOSIS — N400 Enlarged prostate without lower urinary tract symptoms: Secondary | ICD-10-CM | POA: Diagnosis not present

## 2023-02-15 DIAGNOSIS — G893 Neoplasm related pain (acute) (chronic): Secondary | ICD-10-CM | POA: Diagnosis not present

## 2023-02-15 DIAGNOSIS — L89159 Pressure ulcer of sacral region, unspecified stage: Secondary | ICD-10-CM | POA: Diagnosis not present

## 2023-02-15 DIAGNOSIS — C9000 Multiple myeloma not having achieved remission: Secondary | ICD-10-CM | POA: Diagnosis not present

## 2023-02-15 DIAGNOSIS — L89609 Pressure ulcer of unspecified heel, unspecified stage: Secondary | ICD-10-CM | POA: Diagnosis not present

## 2023-03-07 DEATH — deceased
